# Patient Record
Sex: Female | Born: 1950
Health system: Southern US, Community
[De-identification: ages and names within clinical notes are randomized; demographics above are authoritative.]

## PROBLEM LIST (undated history)

## (undated) DIAGNOSIS — J910 Malignant pleural effusion: Secondary | ICD-10-CM

## (undated) DIAGNOSIS — I7300 Raynaud's syndrome without gangrene: Secondary | ICD-10-CM

## (undated) DIAGNOSIS — K219 Gastro-esophageal reflux disease without esophagitis: Secondary | ICD-10-CM

## (undated) DIAGNOSIS — H409 Unspecified glaucoma: Secondary | ICD-10-CM

## (undated) DIAGNOSIS — R0600 Dyspnea, unspecified: Secondary | ICD-10-CM

## (undated) DIAGNOSIS — I2510 Atherosclerotic heart disease of native coronary artery without angina pectoris: Secondary | ICD-10-CM

## (undated) DIAGNOSIS — C3490 Malignant neoplasm of unspecified part of unspecified bronchus or lung: Secondary | ICD-10-CM

## (undated) DIAGNOSIS — Z9889 Other specified postprocedural states: Secondary | ICD-10-CM

## (undated) DIAGNOSIS — J45909 Unspecified asthma, uncomplicated: Secondary | ICD-10-CM

## (undated) DIAGNOSIS — C801 Malignant (primary) neoplasm, unspecified: Secondary | ICD-10-CM

## (undated) DIAGNOSIS — M1990 Unspecified osteoarthritis, unspecified site: Secondary | ICD-10-CM

## (undated) DIAGNOSIS — I10 Essential (primary) hypertension: Secondary | ICD-10-CM

## (undated) DIAGNOSIS — D649 Anemia, unspecified: Secondary | ICD-10-CM

## (undated) DIAGNOSIS — F419 Anxiety disorder, unspecified: Secondary | ICD-10-CM

## (undated) DIAGNOSIS — F32A Depression, unspecified: Secondary | ICD-10-CM

## (undated) DIAGNOSIS — R7303 Prediabetes: Secondary | ICD-10-CM

## (undated) DIAGNOSIS — F329 Major depressive disorder, single episode, unspecified: Secondary | ICD-10-CM

## (undated) DIAGNOSIS — R112 Nausea with vomiting, unspecified: Secondary | ICD-10-CM

## (undated) HISTORY — PX: TUBAL LIGATION: SHX77

## (undated) HISTORY — PX: COLONOSCOPY: SHX174

## (undated) HISTORY — PX: WISDOM TOOTH EXTRACTION: SHX21

## (undated) HISTORY — DX: Essential (primary) hypertension: I10

## (undated) HISTORY — DX: Raynaud's syndrome without gangrene: I73.00

## (undated) HISTORY — DX: Unspecified osteoarthritis, unspecified site: M19.90

## (undated) HISTORY — PX: ROTATOR CUFF REPAIR: SHX139

## (undated) HISTORY — PX: EYE SURGERY: SHX253

## (undated) HISTORY — PX: ABDOMINAL HYSTERECTOMY: SHX81

## (undated) HISTORY — DX: Unspecified glaucoma: H40.9

## (undated) HISTORY — DX: Malignant neoplasm of unspecified part of unspecified bronchus or lung: C34.90

## (undated) HISTORY — PX: DILATION AND CURETTAGE OF UTERUS: SHX78

## (undated) MED FILL — Sodium Chloride IV Soln 0.9%: INTRAVENOUS | Qty: 250 | Status: AC

## (undated) MED FILL — Diphenhydramine HCl Cap 25 MG: ORAL | Qty: 1 | Status: AC

---

## 2000-10-09 ENCOUNTER — Other Ambulatory Visit: Admission: RE | Admit: 2000-10-09 | Discharge: 2000-10-09 | Payer: Self-pay | Admitting: Gynecology

## 2002-06-17 ENCOUNTER — Ambulatory Visit (HOSPITAL_COMMUNITY): Admission: RE | Admit: 2002-06-17 | Discharge: 2002-06-17 | Payer: Self-pay | Admitting: Gastroenterology

## 2004-04-14 ENCOUNTER — Other Ambulatory Visit: Admission: RE | Admit: 2004-04-14 | Discharge: 2004-04-14 | Payer: Self-pay | Admitting: Gynecology

## 2005-07-20 ENCOUNTER — Encounter: Admission: RE | Admit: 2005-07-20 | Discharge: 2005-07-20 | Payer: Self-pay | Admitting: Emergency Medicine

## 2009-09-07 ENCOUNTER — Encounter: Admission: RE | Admit: 2009-09-07 | Discharge: 2009-09-07 | Payer: Self-pay | Admitting: Internal Medicine

## 2010-04-22 ENCOUNTER — Encounter: Admission: RE | Admit: 2010-04-22 | Discharge: 2010-04-22 | Payer: Self-pay | Admitting: Gastroenterology

## 2010-11-04 NOTE — Op Note (Signed)
   NAME:  Allison [name], Allison [name]                           ACCOUNT NO.:  [id number]   MEDICAL RECORD NO.:  [id number]                   PATIENT TYPE:  AMB   LOCATION:  ENDO                                 FACILITY:  [facility]   PHYSICIAN:  Anselmo Rod, M.D.               DATE OF BIRTH:  08-16-1950   DATE OF PROCEDURE:  06/17/2002  DATE OF DISCHARGE:                                 OPERATIVE REPORT   PROCEDURE PERFORMED:  Screening colonoscopy.   ENDOSCOPIST:  Charna Elizabeth, M.D.   INSTRUMENT USED:  Olympus adjustable pediatric video colonoscope.   INDICATIONS FOR PROCEDURE:  The patient is a 60 year old African-American  female undergoing screening colonoscopy.  The patient has a history of  Raynaud's disease.   PREPROCEDURE PREPARATION:  Informed consent was procured from the patient.  The patient was fasted for eight hours prior to the procedure and prepped  with a bottle of magnesium citrate and MiraLax the night prior to the  procedure.   PREPROCEDURE PHYSICAL:  The patient had stable vital signs.  Neck supple.  Chest clear to auscultation.  S1 and S2 regular.  Abdomen soft with normal  bowel sounds.   DESCRIPTION OF PROCEDURE:  The patient was placed in left lateral decubitus  position and sedated with 70 mg of Demerol and 8 mg of Versed intravenously.  Once the patient was adequately sedated and maintained on low flow oxygen  and continuous cardiac monitoring, the Olympus video colonoscope was  advanced from the rectum to the cecum and terminal ileum without difficulty.  The entire colonic mucosa appeared healthy with normal vascular pattern.  No  masses, polyps, erosions, ulcerations, diverticula or hemorrhoids were seen.  The terminal ileum was healthy and without lesions as well.   IMPRESSION:  Normal colonoscopy up to the terminal ileum.   RECOMMENDATIONS:  1. Repeat colorectal cancer screening is recommended in the next 10 years     unless the patient develops any abnormal  symptoms in the interim.  2.     A high fiber diet has been discussed with the patient and brochures have     been given to her for her education.  Liberal fluid intake along with a     fiber supplements have been advised.  3. Outpatient follow-up on a p.r.n. basis.                                                    Anselmo Rod, M.D.    JNM/MEDQ  D:  06/17/2002  T:  06/17/2002  Job:  [id number]   cc:   Tracey Harries, M.D.  [address]  [place]  [place] [address]  Fax: [phone]

## 2011-07-19 ENCOUNTER — Other Ambulatory Visit: Payer: Self-pay | Admitting: Gynecology

## 2011-07-19 DIAGNOSIS — R928 Other abnormal and inconclusive findings on diagnostic imaging of breast: Secondary | ICD-10-CM

## 2011-07-27 ENCOUNTER — Other Ambulatory Visit: Payer: Self-pay

## 2011-07-28 ENCOUNTER — Ambulatory Visit
Admission: RE | Admit: 2011-07-28 | Discharge: 2011-07-28 | Disposition: A | Discharge status: Home / Self Care | Payer: 59 | Source: Ambulatory Visit | Attending: Gynecology | Admitting: Gynecology

## 2011-07-28 DIAGNOSIS — R928 Other abnormal and inconclusive findings on diagnostic imaging of breast: Secondary | ICD-10-CM

## 2013-02-15 ENCOUNTER — Ambulatory Visit (INDEPENDENT_AMBULATORY_CARE_PROVIDER_SITE_OTHER): Payer: 59 | Admitting: Family Medicine

## 2013-02-15 ENCOUNTER — Encounter: Payer: Self-pay | Admitting: Family Medicine

## 2013-02-15 ENCOUNTER — Ambulatory Visit: Payer: 59

## 2013-02-15 VITALS — BP 170/78 | HR 68 | Temp 99.0°F | Resp 16 | Ht 64.0 in | Wt 148.0 lb

## 2013-02-15 DIAGNOSIS — M546 Pain in thoracic spine: Secondary | ICD-10-CM

## 2013-02-15 DIAGNOSIS — S233XXA Sprain of ligaments of thoracic spine, initial encounter: Secondary | ICD-10-CM

## 2013-02-15 DIAGNOSIS — M549 Dorsalgia, unspecified: Secondary | ICD-10-CM

## 2013-02-15 DIAGNOSIS — S239XXA Sprain of unspecified parts of thorax, initial encounter: Secondary | ICD-10-CM

## 2013-02-15 MED ORDER — CYCLOBENZAPRINE HCL 5 MG PO TABS: 5.0000 mg | ORAL_TABLET | Freq: Three times a day (TID) | ORAL | Status: DC | PRN

## 2013-02-15 MED ORDER — PREDNISONE 20 MG PO TABS: ORAL_TABLET | ORAL | Status: DC

## 2013-02-15 NOTE — Patient Instructions (Addendum)
1.  Take Tramadol for pain as needed. 2.  Apply ice to area for the first 48 hours and then switch to heat. 3.  Avoid lifting > 10 pounds for the next two weeks.

## 2013-02-15 NOTE — Progress Notes (Signed)
[address]   [place], [place]  [address]   [phone]  Subjective:    Patient ID: Allison [name], female    DOB: 28-Jun-1950, 62 y.o.   MRN: [id number]  HPI This 62 y.o. female presents for evaluation of back pain.  Onset today.  Suspect that when gave granddaughter a bath which is an unusual activity.  No pain at that time.  Went to [employer] with acute onset of R upper back pain.  +pain with deep breathing.  Pain with moving buggy at [facility].  Sitting at rest, moving neck makes pain worse.  Putting purse.  R arm feels weak.  Radiates into anterior chest.  Severity 7/10.  No medications for pain since onset.    PCP:  [name]   Review of Systems  Constitutional: Negative for chills, diaphoresis and fatigue.  Musculoskeletal: Positive for myalgias and back pain. Negative for joint swelling, arthralgias and gait problem.  Skin: Negative for rash.  Neurological: Positive for weakness and numbness.    No past medical history on file.  No past surgical history on file.  Prior to Admission medications   Medication Sig Start Date End Date Taking? Authorizing Provider  amitriptyline (ELAVIL) 10 MG tablet Take 10 mg by mouth at bedtime.   Yes Historical Provider, MD  amLODipine (NORVASC) 10 MG tablet Take 10 mg by mouth daily.   Yes Historical Provider, MD  aspirin 81 MG tablet Take 325 mg by mouth daily.   Yes Historical Provider, MD  Pyridoxine HCl (VITAMIN B6 PO) Take by mouth.   Yes Historical Provider, MD    Not on File  History   Social History  . Marital Status: Married    Spouse Name: N/A    Number of Children: N/A  . Years of Education: N/A   Occupational History  . Not on file.   Social History Main Topics  . Smoking status: Not on file  . Smokeless tobacco: Not on file  . Alcohol Use: Not on file  . Drug Use: Not on file  . Sexual Activity: Not on file   Other Topics Concern  . Not on file   Social History Narrative  . No narrative on file    No  family history on file.     Objective:   Physical Exam  Nursing note and vitals reviewed. Constitutional: She is oriented to person, place, and time. She appears well-developed and well-nourished. No distress.  HENT:  Head: Normocephalic and atraumatic.  Eyes: Conjunctivae are normal. Pupils are equal, round, and reactive to light.  Cardiovascular: Normal rate, regular rhythm and normal heart sounds.  Exam reveals no gallop and no friction rub.   No murmur heard. Pulmonary/Chest: Effort normal and breath sounds normal. She has no wheezes. She has no rales.  Musculoskeletal:       Right shoulder: Normal.       Left shoulder: Normal.       Cervical back: She exhibits decreased range of motion, tenderness and pain. She exhibits no bony tenderness, no swelling and no spasm.       Thoracic back: She exhibits tenderness, pain and spasm. She exhibits normal range of motion and no bony tenderness.  Cervical spine: +pain with ROM in all directions; +TTP distal cervical paraspinal region.  Motor 5/5 BUE; sensation intact. Thoracic spine:  +midline TTP; +TTP paraspinal region on R.    Neurological: She is alert and oriented to person, place, and time.  Skin: Skin is warm  and dry. No rash noted. She is not diaphoretic.  Psychiatric: She has a normal mood and affect. Her behavior is normal.      [facility] reading (PRIMARY) by  Dr. Katrinka Blazing. Cervical spine: multilevel spurring and degenerative changes.  Thoracic Spine: NAD   Assessment & Plan:  Upper back pain on right side - Plan: DG Thoracic Spine 2 View, DG Cervical Spine Complete, predniSONE (DELTASONE) 20 MG tablet, cyclobenzaprine (FLEXERIL) 5 MG tablet  Thoracic sprain and strain, initial encounter   1.  Cervical and thoracic pain R:  New.  Recommend Tramadol as needed for pain; pt has rx at home. 2.  Thoracic and cervical strain:  New. With radiculopathy. Rx for Prednisone provided and Flexeril; ice to area for 48 hours and then advised to  switch to heat.  Passive ROM throughout the day; avoid lifting > 10 pounds for two weeks.  If no improvement in 2-3 weeks, call for ortho referral.  Meds ordered this encounter  Medications  . amLODipine (NORVASC) 10 MG tablet    Sig: Take 10 mg by mouth daily.  Marland Kitchen aspirin 81 MG tablet    Sig: Take 325 mg by mouth daily.  . Pyridoxine HCl (VITAMIN B6 PO)    Sig: Take by mouth.  Marland Kitchen amitriptyline (ELAVIL) 10 MG tablet    Sig: Take 10 mg by mouth at bedtime.  . predniSONE (DELTASONE) 20 MG tablet    Sig: Two tablets daily x 5 days then one tablet daily x 5 days    Dispense:  15 tablet    Refill:  0  . cyclobenzaprine (FLEXERIL) 5 MG tablet    Sig: Take 1 tablet (5 mg total) by mouth 3 (three) times daily as needed for muscle spasms.    Dispense:  30 tablet    Refill:  1

## 2013-05-19 ENCOUNTER — Encounter: Payer: Self-pay | Admitting: *Deleted

## 2013-05-19 NOTE — Telephone Encounter (Signed)
This encounter was created in error - please disregard.

## 2014-01-07 ENCOUNTER — Other Ambulatory Visit: Payer: Self-pay | Admitting: Physical Medicine and Rehabilitation

## 2014-01-07 DIAGNOSIS — M25511 Pain in right shoulder: Secondary | ICD-10-CM

## 2014-01-19 ENCOUNTER — Ambulatory Visit
Admission: RE | Admit: 2014-01-19 | Discharge: 2014-01-19 | Disposition: A | Discharge status: Home / Self Care | Payer: 59 | Source: Ambulatory Visit | Attending: Physical Medicine and Rehabilitation | Admitting: Physical Medicine and Rehabilitation

## 2014-01-19 DIAGNOSIS — M25511 Pain in right shoulder: Secondary | ICD-10-CM

## 2014-04-20 DIAGNOSIS — G4700 Insomnia, unspecified: Secondary | ICD-10-CM | POA: Insufficient documentation

## 2014-10-07 ENCOUNTER — Ambulatory Visit (INDEPENDENT_AMBULATORY_CARE_PROVIDER_SITE_OTHER): Payer: 59 | Admitting: Physician Assistant

## 2014-10-07 VITALS — BP 132/78 | HR 74 | Temp 98.2°F | Resp 18 | Ht 63.08 in | Wt 155.8 lb

## 2014-10-07 DIAGNOSIS — R229 Localized swelling, mass and lump, unspecified: Secondary | ICD-10-CM | POA: Diagnosis not present

## 2014-10-07 DIAGNOSIS — R590 Localized enlarged lymph nodes: Secondary | ICD-10-CM

## 2014-10-07 LAB — POCT CBC
Granulocyte percent: 38.7 %G (ref 37–80)
HCT, POC: 40.1 % (ref 37.7–47.9)
Hemoglobin: 12.6 g/dL (ref 12.2–16.2)
Lymph, poc: 3.6 — AB (ref 0.6–3.4)
MCH, POC: 26.1 pg — AB (ref 27–31.2)
MCHC: 31.4 g/dL — AB (ref 31.8–35.4)
MCV: 83.2 fL (ref 80–97)
MID (cbc): 0.3 (ref 0–0.9)
MPV: 7.7 fL (ref 0–99.8)
POC Granulocyte: 2.4 (ref 2–6.9)
POC LYMPH PERCENT: 56.9 %L — AB (ref 10–50)
POC MID %: 4.4 %M (ref 0–12)
Platelet Count, POC: 324 10*3/uL (ref 142–424)
RBC: 4.82 M/uL (ref 4.04–5.48)
RDW, POC: 16.4 %
WBC: 6.3 10*3/uL (ref 4.6–10.2)

## 2014-10-07 MED ORDER — TRIAMCINOLONE ACETONIDE 0.1 % EX CREA: 1.0000 "application " | TOPICAL_CREAM | Freq: Two times a day (BID) | CUTANEOUS | Status: DC

## 2014-10-07 NOTE — Patient Instructions (Signed)
Please apply the medication as prescribed.  Place it for one week, if symptoms is improving, continue for 1 more week.  If the bumps are not getting any better within a week, please contact me.

## 2014-10-07 NOTE — Progress Notes (Signed)
[facility] [address] [place] [address] [phone]  Date:  10/07/2014   Name:  Allison [name]   DOB:  05-18-1951   MRN:  [id number]  PCP:  Salena Saner., MD    Chief Complaint: Lumps on Back of Neck   History of Present Illness:  Allison [name] is a 64 y.o. very pleasant female patient who presents with the following:  Patient is here today complaining of bumps on her neck that she noticed this morning.  She woke up and scratched her neck and felt the two bumps.  She then noticed that her right eye had a bump just beneath it.  She has had no pain at these locations, it is mildly pruritic.  She denies any knowledge of having contact with any offending agent, though she was at the beach over the last 3 days.  She has no fever, mucosa swelling, or pain.  She has no malaise or body ache.  She has attempted alcohol at the neck, which burned secondary to her scratching.  No other family members or travelling companions have reported an issue.  She has no pets, or contact with animals recently.   There are no active problems to display for this patient.   Past Medical History  Diagnosis Date  . Arthritis   . Glaucoma   . Hypertension   . Raynaud's disease     Past Surgical History  Procedure Laterality Date  . Abdominal hysterectomy    . Rotator cuff repair      History  Substance Use Topics  . Smoking status: Never Smoker   . Smokeless tobacco: Never Used  . Alcohol Use: Yes    Family History  Problem Relation Age of Onset  . Heart disease Sister   . Heart disease Brother     Allergies  Allergen Reactions  . Penicillins   . Vicodin [Hydrocodone-Acetaminophen]     Medication list has been reviewed and updated.  Current Outpatient Prescriptions on File Prior to Visit  Medication Sig Dispense Refill  . amitriptyline (ELAVIL) 10 MG tablet Take 10 mg by mouth at bedtime.    Marland Kitchen amLODipine (NORVASC) 10 MG tablet Take 10 mg by mouth daily.     Marland Kitchen aspirin 81 MG tablet Take 325 mg by mouth daily.    . cyclobenzaprine (FLEXERIL) 5 MG tablet Take 1 tablet (5 mg total) by mouth 3 (three) times daily as needed for muscle spasms. (Patient not taking: Reported on 10/07/2014) 30 tablet 1  . predniSONE (DELTASONE) 20 MG tablet Two tablets daily x 5 days then one tablet daily x 5 days (Patient not taking: Reported on 10/07/2014) 15 tablet 0  . Pyridoxine HCl (VITAMIN B6 PO) Take by mouth.     No current facility-administered medications on file prior to visit.    Review of Systems: ROS otherwise unremarkable unless listed above.  Physical Examination: Filed Vitals:   10/07/14 1510  BP: 132/78  Pulse: 74  Temp: 98.2 F (36.8 C)  Resp: 18   Filed Vitals:   10/07/14 1510  Height: 5' 3.08" (1.602 m)  Weight: 155 lb 12.8 oz (70.67 kg)   Body mass index is 27.54 kg/(m^2). Ideal Body Weight: Weight in (lb) to have BMI = 25: 141.2 Physical Exam  Constitutional: She is oriented to person, place, and time. She appears well-developed and well-nourished.  HENT:  Head: Normocephalic and atraumatic.  Neck: No thyromegaly present.  Cardiovascular: Normal rate and regular rhythm.  Pulmonary/Chest: Effort normal and breath sounds normal.  Abdominal: Soft. Bowel sounds are normal. She exhibits no mass. There is no tenderness.  Lymphadenopathy:       Head (right side): No submandibular, no tonsillar, no preauricular and no posterior auricular adenopathy present.       Head (left side): No submandibular, no tonsillar, no preauricular and no posterior auricular adenopathy present.       Right: No supraclavicular adenopathy present.       Left: No supraclavicular adenopathy present.  Neurological: She is alert and oriented to person, place, and time.  Skin: Skin is warm and dry.  2 posterior nodules at the c4-c5.  They are erythematous and superficial at skin.  They are non-tender.  Difficult to find any puncture site.  The right nodule is  slightly more superior to the left nodule which are parallel, bordering the vertebrae.  The right side of face also has superficial erythematous nodule just beneath the left outer canthus.  Psychiatric: She has a normal mood and affect. Her behavior is normal.    Results for orders placed or performed in visit on 10/07/14  POCT CBC  Result Value Ref Range   WBC 6.3 4.6 - 10.2 K/uL   Lymph, poc 3.6 (A) 0.6 - 3.4   POC LYMPH PERCENT 56.9 (A) 10 - 50 %L   MID (cbc) 0.3 0 - 0.9   POC MID % 4.4 0 - 12 %M   POC Granulocyte 2.4 2 - 6.9   Granulocyte percent 38.7 37 - 80 %G   RBC 4.82 4.04 - 5.48 M/uL   Hemoglobin 12.6 12.2 - 16.2 g/dL   HCT, POC 40.1 37.7 - 47.9 %   MCV 83.2 80 - 97 fL   MCH, POC 26.1 (A) 27 - 31.2 pg   MCHC 31.4 (A) 31.8 - 35.4 g/dL   RDW, POC 16.4 %   Platelet Count, POC 324 142 - 424 K/uL   MPV 7.7 0 - 99.8 fL     Assessment and Plan: 64 year old female is here today for chief complaint of bumps at the back of her neck.  At this time, we are more suspicious that these could be insect bites given the superficial appearance.  Treating with mild corticosteroid. rtc if sxs do not resolve in 7 days.  Swelling, superficial, localized - Plan: POCT CBC, triamcinolone cream (KENALOG) 0.1 %  Ivar Drape, PA-C [facility] 4/26/201610:49 AM

## 2014-10-17 NOTE — Progress Notes (Signed)
  Medical screening examination/treatment/procedure(s) were performed by non-physician practitioner and as supervising physician I was immediately available for consultation/collaboration.     

## 2015-01-22 ENCOUNTER — Telehealth: Payer: Self-pay

## 2015-01-22 NOTE — Telephone Encounter (Signed)
Patient was calling about receiving a prescription for Integra Plus capsules.  She went to Dr. Juanita Craver several years ago and was prescribed this medication for her low iron levels.  She said her recent bloodwork indicated that her ferritin levels were at 5.  She wasn't sure if we could prescribe her that medication, or if she needed to go through Dr. Collene Mares at [facility].  Please advise.  Thank you.  CB#: [phone]

## 2015-01-22 NOTE — Telephone Encounter (Signed)
Please advise 

## 2015-01-22 NOTE — Telephone Encounter (Signed)
She has not been seen here for this and should come to clinic or check with her PCP for a refill. Thank you!

## 2015-01-22 NOTE — Telephone Encounter (Signed)
error 

## 2015-03-29 DIAGNOSIS — M79673 Pain in unspecified foot: Secondary | ICD-10-CM | POA: Insufficient documentation

## 2015-04-12 ENCOUNTER — Ambulatory Visit (INDEPENDENT_AMBULATORY_CARE_PROVIDER_SITE_OTHER): Payer: 59

## 2015-04-12 ENCOUNTER — Ambulatory Visit (INDEPENDENT_AMBULATORY_CARE_PROVIDER_SITE_OTHER): Payer: 59 | Admitting: Podiatry

## 2015-04-12 ENCOUNTER — Encounter: Payer: Self-pay | Admitting: Podiatry

## 2015-04-12 VITALS — BP 147/82 | HR 67 | Resp 12

## 2015-04-12 DIAGNOSIS — M79672 Pain in left foot: Secondary | ICD-10-CM

## 2015-04-12 DIAGNOSIS — M7662 Achilles tendinitis, left leg: Secondary | ICD-10-CM

## 2015-04-12 DIAGNOSIS — M79671 Pain in right foot: Secondary | ICD-10-CM

## 2015-04-12 NOTE — Progress Notes (Signed)
   Subjective:    Patient ID: Allison [name], female    DOB: [birth year], 64 y.o.   MRN: [id number]  HPI  PT STATED BACK OF THE HEEL IS BEEN PAINFUL FOR 3 YEARS. FOOT IS GETTING WORSE ESPECIALLY WHEN PUTTING PRESSURE AND TRIED NO TREATMENT.  Review of Systems  Cardiovascular: Positive for leg swelling.  Psychiatric/Behavioral: The patient is nervous/anxious.        Objective:   Physical Exam        Assessment & Plan:

## 2015-04-12 NOTE — Patient Instructions (Addendum)
Achilles Tendinitis  with Rehab Achilles tendinitis is a disorder of the Achilles tendon. The Achilles tendon connects the large calf muscles (Gastrocnemius and Soleus) to the heel bone (calcaneus). This tendon is sometimes called the heel cord. It is important for pushing-off and standing on your toes and is important for walking, running, or jumping. Tendinitis is often caused by overuse and repetitive microtrauma. SYMPTOMS  Pain, tenderness, swelling, warmth, and redness may occur over the Achilles tendon even at rest.  Pain with pushing off, or flexing or extending the ankle.  Pain that is worsened after or during activity. CAUSES   Overuse sometimes seen with rapid increase in exercise programs or in sports requiring running and jumping.  Poor physical conditioning (strength and flexibility or endurance).  Running sports, especially training running down hills.  Inadequate warm-up before practice or play or failure to stretch before participation.  Injury to the tendon. PREVENTION   Warm up and stretch before practice or competition.  Allow time for adequate rest and recovery between practices and competition.  Keep up conditioning.  Keep up ankle and leg flexibility.  Improve or keep muscle strength and endurance.  Improve cardiovascular fitness.  Use proper technique.  Use proper equipment (shoes, skates).  To help prevent recurrence, taping, protective strapping, or an adhesive bandage may be recommended for several weeks after healing is complete. PROGNOSIS   Recovery may take weeks to several months to heal.  Longer recovery is expected if symptoms have been prolonged.  Recovery is usually quicker if the inflammation is due to a direct blow as compared with overuse or sudden strain. RELATED COMPLICATIONS   Healing time will be prolonged if the condition is not correctly treated. The injury must be given plenty of time to heal.  Symptoms can reoccur if  activity is resumed too soon.  Untreated, tendinitis may increase the risk of tendon rupture requiring additional time for recovery and possibly surgery. TREATMENT   The first treatment consists of rest anti-inflammatory medication, and ice to relieve the pain.  Stretching and strengthening exercises after resolution of pain will likely help reduce the risk of recurrence. Referral to a physical therapist or athletic trainer for further evaluation and treatment may be helpful.  A walking boot or cast may be recommended to rest the Achilles tendon. This can help break the cycle of inflammation and microtrauma.  Arch supports (orthotics) may be prescribed or recommended by your caregiver as an adjunct to therapy and rest.  Surgery to remove the inflamed tendon lining or degenerated tendon tissue is rarely necessary and has shown less than predictable results. MEDICATION   Nonsteroidal anti-inflammatory medications, such as aspirin and ibuprofen, may be used for pain and inflammation relief. Do not take within 7 days before surgery. Take these as directed by your caregiver. Contact your caregiver immediately if any bleeding, stomach upset, or signs of allergic reaction occur. Other minor pain relievers, such as acetaminophen, may also be used.  Pain relievers may be prescribed as necessary by your caregiver. Do not take prescription pain medication for longer than 4 to 7 days. Use only as directed and only as much as you need.  Cortisone injections are rarely indicated. Cortisone injections may weaken tendons and predispose to rupture. It is better to give the condition more time to heal than to use them. HEAT AND COLD  Cold is used to relieve pain and reduce inflammation for acute and chronic Achilles tendinitis. Cold should be applied for 10 to 15 minutes   every 2 to 3 hours for inflammation and pain and immediately after any activity that aggravates your symptoms. Use ice packs or an ice  massage.  Heat may be used before performing stretching and strengthening activities prescribed by your caregiver. Use a heat pack or a warm soak. SEEK MEDICAL CARE IF:  Symptoms get worse or do not improve in 2 weeks despite treatment.  New, unexplained symptoms develop. Drugs used in treatment may produce side effects.  EXERCISES:  RANGE OF MOTION (ROM) AND STRETCHING EXERCISES - Achilles Tendinitis  These exercises may help you when beginning to rehabilitate your injury. Your symptoms may resolve with or without further involvement from your physician, physical therapist or athletic trainer. While completing these exercises, remember:   Restoring tissue flexibility helps normal motion to return to the joints. This allows healthier, less painful movement and activity.  An effective stretch should be held for at least 30 seconds.  A stretch should never be painful. You should only feel a gentle lengthening or release in the stretched tissue.  STRETCH  Gastroc, Standing   Place hands on wall.  Extend right / left leg, keeping the front knee somewhat bent.  Slightly point your toes inward on your back foot.  Keeping your right / left heel on the floor and your knee straight, shift your weight toward the wall, not allowing your back to arch.  You should feel a gentle stretch in the right / left calf. Hold this position for 10 seconds. Repeat 3 times. Complete this stretch 2 times per day.  STRETCH  Soleus, Standing   Place hands on wall.  Extend right / left leg, keeping the other knee somewhat bent.  Slightly point your toes inward on your back foot.  Keep your right / left heel on the floor, bend your back knee, and slightly shift your weight over the back leg so that you feel a gentle stretch deep in your back calf.  Hold this position for 10 seconds. Repeat 3 times. Complete this stretch 2 times per day.  STRETCH  Gastrocsoleus, Standing  Note: This exercise can place  a lot of stress on your foot and ankle. Please complete this exercise only if specifically instructed by your caregiver.   Place the ball of your right / left foot on a step, keeping your other foot firmly on the same step.  Hold on to the wall or a rail for balance.  Slowly lift your other foot, allowing your body weight to press your heel down over the edge of the step.  You should feel a stretch in your right / left calf.  Hold this position for 10 seconds.  Repeat this exercise with a slight bend in your knee. Repeat 3 times. Complete this stretch 2 times per day.   STRENGTHENING EXERCISES - Achilles Tendinitis These exercises may help you when beginning to rehabilitate your injury. They may resolve your symptoms with or without further involvement from your physician, physical therapist or athletic trainer. While completing these exercises, remember:   Muscles can gain both the endurance and the strength needed for everyday activities through controlled exercises.  Complete these exercises as instructed by your physician, physical therapist or athletic trainer. Progress the resistance and repetitions only as guided.  You may experience muscle soreness or fatigue, but the pain or discomfort you are trying to eliminate should never worsen during these exercises. If this pain does worsen, stop and make certain you are following the directions exactly. If   the pain is still present after adjustments, discontinue the exercise until you can discuss the trouble with your clinician.  STRENGTH - Plantar-flexors   Sit with your right / left leg extended. Holding onto both ends of a rubber exercise band/tubing, loop it around the ball of your foot. Keep a slight tension in the band.  Slowly push your toes away from you, pointing them downward.  Hold this position for 10 seconds. Return slowly, controlling the tension in the band/tubing. Repeat 3 times. Complete this exercise 2 times per day.    STRENGTH - Plantar-flexors   Stand with your feet shoulder width apart. Steady yourself with a wall or table using as little support as needed.  Keeping your weight evenly spread over the width of your feet, rise up on your toes.*  Hold this position for 10 seconds. Repeat 3 times. Complete this exercise 2 times per day.  *If this is too easy, shift your weight toward your right / left leg until you feel challenged. Ultimately, you may be asked to do this exercise with your right / left foot only.  STRENGTH  Plantar-flexors, Eccentric  Note: This exercise can place a lot of stress on your foot and ankle. Please complete this exercise only if specifically instructed by your caregiver.   Place the balls of your feet on a step. With your hands, use only enough support from a wall or rail to keep your balance.  Keep your knees straight and rise up on your toes.  Slowly shift your weight entirely to your right / left toes and pick up your opposite foot. Gently and with controlled movement, lower your weight through your right / left foot so that your heel drops below the level of the step. You will feel a slight stretch in the back of your calf at the end position.  Use the healthy leg to help rise up onto the balls of both feet, then lower weight only on the right / left leg again. Build up to 15 repetitions. Then progress to 3 consecutive sets of 15 repetitions.*  After completing the above exercise, complete the same exercise with a slight knee bend (about 30 degrees). Again, build up to 15 repetitions. Then progress to 3 consecutive sets of 15 repetitions.* Perform this exercise 2 times per day.  *When you easily complete 3 sets of 15, your physician, physical therapist or athletic trainer may advise you to add resistance by wearing a backpack filled with additional weight.  STRENGTH - Plantar Flexors, Seated   Sit on a chair that allows your feet to rest flat on the ground. If  necessary, sit at the edge of the chair.  Keeping your toes firmly on the ground, lift your right / left heel as far as you can without increasing any discomfort in your ankle. Repeat 3 times. Complete this exercise 2 times a day.   

## 2015-04-13 NOTE — Progress Notes (Signed)
Subjective:     Patient ID: Carle Dargan, female   DOB: 1951/02/03, 64 y.o.   MRN: [id number]  HPI patient presents with mild to moderate pain in the posterior aspect of the left heel at the insertional point of the Achilles into the heel. States it bothers her quite a bit at times and then at other times it seems pretty good   Review of Systems     Objective:   Physical Exam Neurovascular status intact muscle strength adequate with mild discomfort at the posterior heel left insertional point Achilles into the calcaneus and slightly proximal with normal muscle strength of the Achilles. Patient has good digital perfusion and is well oriented 3    Assessment:     Mild Achilles tendinitis left    Plan:     Evaluated condition and reviewed H&P and x-rays with patient. Today I went ahead and advised on physical therapy shoe gear modifications ice therapy and will reappoint if symptoms persist

## 2015-04-25 ENCOUNTER — Ambulatory Visit (INDEPENDENT_AMBULATORY_CARE_PROVIDER_SITE_OTHER): Payer: 59 | Admitting: Family Medicine

## 2015-04-25 VITALS — BP 128/80 | HR 86 | Temp 98.0°F | Resp 17 | Ht 64.0 in | Wt 155.0 lb

## 2015-04-25 DIAGNOSIS — L732 Hidradenitis suppurativa: Secondary | ICD-10-CM

## 2015-04-25 MED ORDER — HYDROCODONE-ACETAMINOPHEN 5-325 MG PO TABS: 1.0000 | ORAL_TABLET | Freq: Four times a day (QID) | ORAL | Status: DC | PRN

## 2015-04-25 MED ORDER — GABAPENTIN 100 MG PO CAPS: 100.0000 mg | ORAL_CAPSULE | Freq: Every day | ORAL | Status: DC

## 2015-04-25 MED ORDER — CLINDAMYCIN HCL 150 MG PO CAPS: 150.0000 mg | ORAL_CAPSULE | Freq: Three times a day (TID) | ORAL | Status: DC

## 2015-04-25 NOTE — Addendum Note (Signed)
Addended by: Robyn Haber on: 04/25/2015 10:01 AM   Modules accepted: Orders

## 2015-04-25 NOTE — Progress Notes (Addendum)
$'@UMFCLOGO'N$ @  This chart was scribed for Robyn Haber, MD by Thea Alken, ED Scribe. This patient was seen in room 9 and the patient's care was started at 9:40 AM.  Patient ID: Allison [name] MRN: [id number], DOB: [birth year], 64 y.o. Date of Encounter: 04/25/2015, 10:07 AM  Primary Physician: Salena Saner., MD  Chief Complaint:  Chief Complaint  Patient presents with   Rash    armpit     HPI: 64 y.o. year old female with history below presents with  painful folliculitis along left axilla for 2 weeks. Pt has had recurring folliculitis only to left axilla for the past several months. She was referred to dermatology to 2 months ago but has not received a phone call. She has been on doxycyline, cephalexin and prednisone. She has pain with left arm movement and is starting to have left back pain. Home treatments consist of warm soaks, chlorox baths, tea tree oil, and OTC creams.  She's had groin irritation in the past as well but she's been able to manage this by simply scrubbing the area vigorously with over-the-counter products  Pt is retired from [employer].  Past Medical History  Diagnosis Date   Arthritis    Glaucoma    Hypertension    Raynaud's disease      Home Meds: Prior to Admission medications   Medication Sig Start Date End Date Taking? Authorizing Provider  amitriptyline (ELAVIL) 10 MG tablet Take 10 mg by mouth at bedtime.   Yes Historical Provider, MD  amLODipine (NORVASC) 10 MG tablet Take 10 mg by mouth daily.   Yes Historical Provider, MD  aspirin 81 MG tablet Take 325 mg by mouth daily.   Yes Historical Provider, MD  brimonidine (ALPHAGAN P) 0.1 % SOLN    Yes Historical Provider, MD  meloxicam (MOBIC) 15 MG tablet TAKE 1 TABLET(S) EVERY DAY BY ORAL ROUTE AS NEEDED. 02/02/15  Yes Historical Provider, MD  TRIAMTERENE PO Take by mouth.   Yes Historical Provider, MD  triamterene-hydrochlorothiazide (MAXZIDE-25) 37.5-25 MG tablet Take 1 tablet by mouth daily. 02/05/15   Yes Historical Provider, MD  cyclobenzaprine (FLEXERIL) 5 MG tablet Take 1 tablet (5 mg total) by mouth 3 (three) times daily as needed for muscle spasms. Patient not taking: Reported on 04/25/2015 02/15/13   Wardell Honour, MD  Pyridoxine HCl (VITAMIN B6 PO) Take by mouth.    Historical Provider, MD  triamcinolone cream (KENALOG) 0.1 % Apply 1 application topically 2 (two) times daily. Patient not taking: Reported on 04/25/2015 10/07/14   Dorian Heckle English, PA    Allergies:  Allergies  Allergen Reactions   Penicillins    Vicodin [Hydrocodone-Acetaminophen]     Social History   Social History   Marital Status: Married    Spouse Name: N/A   Number of Children: 3   Years of Education: N/A   Occupational History    [employer]   Social History Main Topics   Smoking status: Never Smoker    Smokeless tobacco: Never Used   Alcohol Use: Yes   Drug Use: No   Sexual Activity: Not on file   Other Topics Concern   Not on file   Social History Narrative    Review of Systems: Constitutional: negative for chills, fever, night sweats, weight changes, or fatigue  HEENT: negative for vision changes, hearing loss, congestion, rhinorrhea, ST, epistaxis, or sinus pressure Cardiovascular: negative for chest pain or palpitations Respiratory: negative for hemoptysis, wheezing, shortness of breath, or cough Abdominal: negative  for abdominal pain, nausea, vomiting, diarrhea, or constipation Dermatological: negative for rash Neurologic: negative for headache, dizziness, or syncope All other systems reviewed and are otherwise negative with the exception to those above and in the HPI.   Physical Exam: Blood pressure 128/80, pulse 86, temperature 98 F (36.7 C), temperature source Oral, resp. rate 17, height '5\' 4"'$  (1.626 m), weight 155 lb (70.308 kg)., Body mass index is 26.59 kg/(m^2). General: Well developed, well nourished, in no acute distress. Head: Normocephalic, atraumatic,  eyes without discharge, sclera non-icteric, nares are without discharge. Bilateral auditory canals clear, TM's are without perforation, pearly grey and translucent with reflective cone of light bilaterally. Oral cavity moist, posterior pharynx without exudate, erythema, peritonsillar abscess, or post nasal drip.  Neck: Supple. No thyromegaly. Full ROM. No lymphadenopathy. Lungs: Clear bilaterally to auscultation without wheezes, rales, or rhonchi. Breathing is unlabored. Heart: RRR with S1 S2. No murmurs, rubs, or gallops appreciated. Abdomen: Soft, non-tender, non-distended with normoactive bowel sounds. No hepatomegaly. No rebound/guarding. No obvious abdominal masses. Msk:  Strength and tone normal for age. Extremities/Skin: 2x1 cm boils in the left axilla ( patient refuses I&D) with surrounding erythema and swelling Neuro: Alert and oriented X 3. Moves all extremities spontaneously. Gait is normal. CNII-XII grossly in tact. Psych:  Responds to questions appropriately with a normal affect.   ASSESSMENT AND PLAN:  64 y.o. year old female with hidradenitis  This chart was scribed in my presence and reviewed by me personally.    ICD-9-CM ICD-10-CM   1. Hidradenitis axillaris 705.83 L73.2 clindamycin (CLEOCIN) 150 MG capsule     Ambulatory referral to Dermatology     gabapentin (NEURONTIN) 100 MG capsule     DISCONTINUED: HYDROcodone-acetaminophen (NORCO) 5-325 MG tablet     Signed, Robyn Haber, MD    By signing my name below, I, Raven Small, attest that this documentation has been prepared under the direction and in the presence of Robyn Haber, MD.  Electronically Signed: Thea Alken, ED Scribe. 04/25/2015. 10:07 AM.  Signed, Robyn Haber, MD 04/25/2015 10:07 AM

## 2015-04-25 NOTE — Patient Instructions (Signed)
Hidradenitis Suppurativa  Hidradenitis suppurativa is a long-term (chronic) skin disease that starts with blocked sweat glands or hair follicles. Bacteria may grow in these blocked openings of your skin. Hidradenitis suppurativa is like a severe form of acne that develops in areas of your body where acne would be unusual. It is most likely to affect the areas of your body where skin rubs against skin and becomes moist. This includes your:  · Underarms.  · Groin.  · Genital areas.  · Buttocks.  · Upper thighs.  · Breasts.  Hidradenitis suppurativa may start out with small pimples. The pimples can develop into deep sores that break open (rupture) and drain pus. Over time your skin may thicken and become scarred. Hidradenitis suppurativa cannot be passed from person to person.   CAUSES   The exact cause of hidradenitis suppurativa is not known. This condition may be due to:  · Female and female hormones. The condition is rare before and after puberty.  · An overactive body defense system (immune system). Your immune system may overreact to the blocked hair follicles or sweat glands and cause swelling and pus-filled sores.  RISK FACTORS  You may have a higher risk of hidradenitis suppurativa if you:  · Are a woman.  · Are between ages 11 and 55.  · Have a family history of hidradenitis suppurativa.  · Have a personal history of acne.  · Are overweight.  · Smoke.  · Take the drug lithium.  SIGNS AND SYMPTOMS   The first signs of an outbreak are usually painful skin bumps that look like pimples. As the condition progresses:  · Skin bumps may get bigger and grow deeper into the skin.  · Bumps under the skin may rupture and drain smelly pus.  · Skin may become itchy and infected.  · Skin may thicken and scar.  · Drainage may continue through tunnels under the skin (fistulas).  · Walking and moving your arms can become painful.  DIAGNOSIS   Your health care provider may diagnose hidradenitis suppurativa based on your medical  history and your signs and symptoms. A physical exam will also be done. You may need to see a health care provider who specializes in skin diseases (dermatologist). You may also have tests done to confirm the diagnosis. These can include:  · Swabbing a sample of pus or drainage from your skin so it can be sent to the lab and tested for infection.  · Blood tests to check for infection.  TREATMENT   The same treatment will not work for everybody with hidradenitis suppurativa. Your treatment will depend on how severe your symptoms are. You may need to try several treatments to find what works best for you. Part of your treatment may include cleaning and bandaging (dressing) your wounds. You may also have to take medicines, such as the following:  · Antibiotics.  · Acne medicines.  · Medicines to block or suppress the immune system.  · A diabetes medicine (metformin) is sometimes used to treat this condition.  · For women, birth control pills can sometimes help relieve symptoms.  You may need surgery if you have a severe case of hidradenitis suppurativa that does not respond to medicine. Surgery may involve:   · Using a laser to clear the skin and remove hair follicles.  · Opening and draining deep sores.  · Removing the areas of skin that are diseased and scarred.  HOME CARE INSTRUCTIONS  · Learn as much as   you can about your disease, and work closely with your health care providers.  · Take medicines only as directed by your health care provider.  · If you were prescribed an antibiotic medicine, finish it all even if you start to feel better.  · If you are overweight, losing weight may be very helpful. Try to reach and maintain a healthy weight.  · Do not use any tobacco products, including cigarettes, chewing tobacco, or electronic cigarettes. If you need help quitting, ask your health care provider.  · Do not shave the areas where you get hidradenitis suppurativa.  · Do not wear deodorant.  · Wear loose-fitting  clothes.  · Try not to overheat and get sweaty.  · Take a daily bleach bath as directed by your health care provider.  ¨ Fill your bathtub halfway with water.  ¨ Pour in ½ cup of unscented household bleach.  ¨ Soak for 5-10 minutes.  · Cover sore areas with a warm, clean washcloth (compress) for 5-10 minutes.  SEEK MEDICAL CARE IF:   · You have a flare-up of hidradenitis suppurativa.  · You have chills or a fever.  · You are having trouble controlling your symptoms at home.     This information is not intended to replace advice given to you by your health care provider. Make sure you discuss any questions you have with your health care provider.     Document Released: 01/18/2004 Document Revised: 06/26/2014 Document Reviewed: 09/05/2013  Elsevier Interactive Patient Education ©2016 [employer]

## 2015-04-25 NOTE — Addendum Note (Signed)
Addended by: Robyn Haber on: 04/25/2015 10:08 AM   Modules accepted: Orders

## 2016-01-18 DIAGNOSIS — L304 Erythema intertrigo: Secondary | ICD-10-CM | POA: Diagnosis not present

## 2016-01-18 DIAGNOSIS — L309 Dermatitis, unspecified: Secondary | ICD-10-CM | POA: Diagnosis not present

## 2016-02-23 DIAGNOSIS — H401131 Primary open-angle glaucoma, bilateral, mild stage: Secondary | ICD-10-CM | POA: Diagnosis not present

## 2016-03-07 DIAGNOSIS — E663 Overweight: Secondary | ICD-10-CM | POA: Diagnosis not present

## 2016-03-07 DIAGNOSIS — E785 Hyperlipidemia, unspecified: Secondary | ICD-10-CM | POA: Diagnosis not present

## 2016-03-07 DIAGNOSIS — I10 Essential (primary) hypertension: Secondary | ICD-10-CM | POA: Diagnosis not present

## 2016-03-07 DIAGNOSIS — N959 Unspecified menopausal and perimenopausal disorder: Secondary | ICD-10-CM | POA: Diagnosis not present

## 2016-03-08 DIAGNOSIS — N959 Unspecified menopausal and perimenopausal disorder: Secondary | ICD-10-CM | POA: Diagnosis not present

## 2016-03-08 DIAGNOSIS — I10 Essential (primary) hypertension: Secondary | ICD-10-CM | POA: Diagnosis not present

## 2016-04-25 DIAGNOSIS — H10413 Chronic giant papillary conjunctivitis, bilateral: Secondary | ICD-10-CM | POA: Diagnosis not present

## 2016-04-25 DIAGNOSIS — H401131 Primary open-angle glaucoma, bilateral, mild stage: Secondary | ICD-10-CM | POA: Diagnosis not present

## 2016-04-25 DIAGNOSIS — E785 Hyperlipidemia, unspecified: Secondary | ICD-10-CM | POA: Diagnosis not present

## 2016-04-25 DIAGNOSIS — H2513 Age-related nuclear cataract, bilateral: Secondary | ICD-10-CM | POA: Diagnosis not present

## 2016-04-25 DIAGNOSIS — R7309 Other abnormal glucose: Secondary | ICD-10-CM | POA: Diagnosis not present

## 2016-04-25 DIAGNOSIS — E559 Vitamin D deficiency, unspecified: Secondary | ICD-10-CM | POA: Diagnosis not present

## 2016-04-25 DIAGNOSIS — H04123 Dry eye syndrome of bilateral lacrimal glands: Secondary | ICD-10-CM | POA: Diagnosis not present

## 2016-04-25 DIAGNOSIS — I10 Essential (primary) hypertension: Secondary | ICD-10-CM | POA: Diagnosis not present

## 2016-05-03 ENCOUNTER — Other Ambulatory Visit: Payer: Self-pay | Admitting: Internal Medicine

## 2016-05-03 DIAGNOSIS — E663 Overweight: Secondary | ICD-10-CM | POA: Diagnosis not present

## 2016-05-03 DIAGNOSIS — E2839 Other primary ovarian failure: Secondary | ICD-10-CM

## 2016-05-03 DIAGNOSIS — Z0000 Encounter for general adult medical examination without abnormal findings: Secondary | ICD-10-CM | POA: Diagnosis not present

## 2016-05-03 DIAGNOSIS — R7309 Other abnormal glucose: Secondary | ICD-10-CM | POA: Diagnosis not present

## 2016-05-03 DIAGNOSIS — Z139 Encounter for screening, unspecified: Secondary | ICD-10-CM | POA: Diagnosis not present

## 2016-05-03 DIAGNOSIS — I10 Essential (primary) hypertension: Secondary | ICD-10-CM | POA: Diagnosis not present

## 2016-05-03 DIAGNOSIS — E785 Hyperlipidemia, unspecified: Secondary | ICD-10-CM | POA: Diagnosis not present

## 2016-05-03 DIAGNOSIS — R21 Rash and other nonspecific skin eruption: Secondary | ICD-10-CM | POA: Diagnosis not present

## 2016-05-05 ENCOUNTER — Ambulatory Visit
Admission: RE | Admit: 2016-05-05 | Discharge: 2016-05-05 | Disposition: A | Discharge status: Home / Self Care | Payer: Medicare HMO | Source: Ambulatory Visit | Attending: Internal Medicine | Admitting: Internal Medicine

## 2016-05-05 DIAGNOSIS — M85851 Other specified disorders of bone density and structure, right thigh: Secondary | ICD-10-CM | POA: Diagnosis not present

## 2016-05-05 DIAGNOSIS — Z780 Asymptomatic menopausal state: Secondary | ICD-10-CM | POA: Diagnosis not present

## 2016-05-05 DIAGNOSIS — E2839 Other primary ovarian failure: Secondary | ICD-10-CM

## 2016-07-19 DIAGNOSIS — R69 Illness, unspecified: Secondary | ICD-10-CM | POA: Diagnosis not present

## 2016-08-29 DIAGNOSIS — N959 Unspecified menopausal and perimenopausal disorder: Secondary | ICD-10-CM | POA: Diagnosis not present

## 2016-08-29 DIAGNOSIS — R69 Illness, unspecified: Secondary | ICD-10-CM | POA: Diagnosis not present

## 2016-08-29 DIAGNOSIS — I10 Essential (primary) hypertension: Secondary | ICD-10-CM | POA: Diagnosis not present

## 2016-08-29 DIAGNOSIS — J069 Acute upper respiratory infection, unspecified: Secondary | ICD-10-CM | POA: Diagnosis not present

## 2016-08-29 DIAGNOSIS — E663 Overweight: Secondary | ICD-10-CM | POA: Diagnosis not present

## 2016-08-31 DIAGNOSIS — H2513 Age-related nuclear cataract, bilateral: Secondary | ICD-10-CM | POA: Diagnosis not present

## 2016-08-31 DIAGNOSIS — H401112 Primary open-angle glaucoma, right eye, moderate stage: Secondary | ICD-10-CM | POA: Diagnosis not present

## 2016-08-31 DIAGNOSIS — H401121 Primary open-angle glaucoma, left eye, mild stage: Secondary | ICD-10-CM | POA: Diagnosis not present

## 2016-08-31 DIAGNOSIS — H04123 Dry eye syndrome of bilateral lacrimal glands: Secondary | ICD-10-CM | POA: Diagnosis not present

## 2016-10-30 DIAGNOSIS — Z1231 Encounter for screening mammogram for malignant neoplasm of breast: Secondary | ICD-10-CM | POA: Diagnosis not present

## 2016-10-30 DIAGNOSIS — Z01419 Encounter for gynecological examination (general) (routine) without abnormal findings: Secondary | ICD-10-CM | POA: Diagnosis not present

## 2016-10-30 DIAGNOSIS — R634 Abnormal weight loss: Secondary | ICD-10-CM | POA: Diagnosis not present

## 2016-10-30 DIAGNOSIS — Z6823 Body mass index (BMI) 23.0-23.9, adult: Secondary | ICD-10-CM | POA: Diagnosis not present

## 2016-11-20 DIAGNOSIS — L255 Unspecified contact dermatitis due to plants, except food: Secondary | ICD-10-CM | POA: Diagnosis not present

## 2016-11-20 DIAGNOSIS — E785 Hyperlipidemia, unspecified: Secondary | ICD-10-CM | POA: Diagnosis not present

## 2016-11-20 DIAGNOSIS — I10 Essential (primary) hypertension: Secondary | ICD-10-CM | POA: Diagnosis not present

## 2016-11-20 DIAGNOSIS — N951 Menopausal and female climacteric states: Secondary | ICD-10-CM | POA: Diagnosis not present

## 2016-12-06 DIAGNOSIS — E785 Hyperlipidemia, unspecified: Secondary | ICD-10-CM | POA: Diagnosis not present

## 2016-12-10 DIAGNOSIS — R918 Other nonspecific abnormal finding of lung field: Secondary | ICD-10-CM | POA: Insufficient documentation

## 2017-01-09 DIAGNOSIS — H43393 Other vitreous opacities, bilateral: Secondary | ICD-10-CM | POA: Diagnosis not present

## 2017-01-09 DIAGNOSIS — H401121 Primary open-angle glaucoma, left eye, mild stage: Secondary | ICD-10-CM | POA: Diagnosis not present

## 2017-01-09 DIAGNOSIS — H401112 Primary open-angle glaucoma, right eye, moderate stage: Secondary | ICD-10-CM | POA: Diagnosis not present

## 2017-01-09 DIAGNOSIS — H04123 Dry eye syndrome of bilateral lacrimal glands: Secondary | ICD-10-CM | POA: Diagnosis not present

## 2017-01-09 DIAGNOSIS — H2513 Age-related nuclear cataract, bilateral: Secondary | ICD-10-CM | POA: Diagnosis not present

## 2017-03-06 DIAGNOSIS — N760 Acute vaginitis: Secondary | ICD-10-CM | POA: Diagnosis not present

## 2017-03-07 DIAGNOSIS — Z0101 Encounter for examination of eyes and vision with abnormal findings: Secondary | ICD-10-CM | POA: Diagnosis not present

## 2017-03-07 DIAGNOSIS — R69 Illness, unspecified: Secondary | ICD-10-CM | POA: Diagnosis not present

## 2017-03-07 DIAGNOSIS — I2510 Atherosclerotic heart disease of native coronary artery without angina pectoris: Secondary | ICD-10-CM | POA: Diagnosis not present

## 2017-03-07 DIAGNOSIS — S82142D Displaced bicondylar fracture of left tibia, subsequent encounter for closed fracture with routine healing: Secondary | ICD-10-CM | POA: Diagnosis not present

## 2017-03-07 DIAGNOSIS — M6281 Muscle weakness (generalized): Secondary | ICD-10-CM | POA: Diagnosis not present

## 2017-03-07 DIAGNOSIS — R262 Difficulty in walking, not elsewhere classified: Secondary | ICD-10-CM | POA: Diagnosis not present

## 2017-03-14 DIAGNOSIS — H2513 Age-related nuclear cataract, bilateral: Secondary | ICD-10-CM | POA: Diagnosis not present

## 2017-03-14 DIAGNOSIS — R69 Illness, unspecified: Secondary | ICD-10-CM | POA: Diagnosis not present

## 2017-03-14 DIAGNOSIS — Z0101 Encounter for examination of eyes and vision with abnormal findings: Secondary | ICD-10-CM | POA: Diagnosis not present

## 2017-03-14 DIAGNOSIS — H401131 Primary open-angle glaucoma, bilateral, mild stage: Secondary | ICD-10-CM | POA: Diagnosis not present

## 2017-03-14 DIAGNOSIS — M6281 Muscle weakness (generalized): Secondary | ICD-10-CM | POA: Diagnosis not present

## 2017-03-14 DIAGNOSIS — I2510 Atherosclerotic heart disease of native coronary artery without angina pectoris: Secondary | ICD-10-CM | POA: Diagnosis not present

## 2017-04-30 DIAGNOSIS — I10 Essential (primary) hypertension: Secondary | ICD-10-CM | POA: Diagnosis not present

## 2017-04-30 DIAGNOSIS — R51 Headache: Secondary | ICD-10-CM | POA: Diagnosis not present

## 2017-05-21 DIAGNOSIS — R7309 Other abnormal glucose: Secondary | ICD-10-CM | POA: Diagnosis not present

## 2017-05-21 DIAGNOSIS — Z139 Encounter for screening, unspecified: Secondary | ICD-10-CM | POA: Diagnosis not present

## 2017-05-21 DIAGNOSIS — I10 Essential (primary) hypertension: Secondary | ICD-10-CM | POA: Diagnosis not present

## 2017-05-21 DIAGNOSIS — E785 Hyperlipidemia, unspecified: Secondary | ICD-10-CM | POA: Diagnosis not present

## 2017-05-21 DIAGNOSIS — Z0000 Encounter for general adult medical examination without abnormal findings: Secondary | ICD-10-CM | POA: Diagnosis not present

## 2017-05-21 DIAGNOSIS — Z0001 Encounter for general adult medical examination with abnormal findings: Secondary | ICD-10-CM | POA: Diagnosis not present

## 2017-05-21 DIAGNOSIS — E559 Vitamin D deficiency, unspecified: Secondary | ICD-10-CM | POA: Diagnosis not present

## 2017-05-21 DIAGNOSIS — I7300 Raynaud's syndrome without gangrene: Secondary | ICD-10-CM | POA: Diagnosis not present

## 2017-08-22 DIAGNOSIS — E785 Hyperlipidemia, unspecified: Secondary | ICD-10-CM | POA: Diagnosis not present

## 2017-08-22 DIAGNOSIS — N959 Unspecified menopausal and perimenopausal disorder: Secondary | ICD-10-CM | POA: Diagnosis not present

## 2017-08-22 DIAGNOSIS — I10 Essential (primary) hypertension: Secondary | ICD-10-CM | POA: Diagnosis not present

## 2017-08-22 DIAGNOSIS — I7300 Raynaud's syndrome without gangrene: Secondary | ICD-10-CM | POA: Diagnosis not present

## 2017-09-10 DIAGNOSIS — R69 Illness, unspecified: Secondary | ICD-10-CM | POA: Diagnosis not present

## 2017-10-02 DIAGNOSIS — H401112 Primary open-angle glaucoma, right eye, moderate stage: Secondary | ICD-10-CM | POA: Diagnosis not present

## 2017-10-02 DIAGNOSIS — H2513 Age-related nuclear cataract, bilateral: Secondary | ICD-10-CM | POA: Diagnosis not present

## 2017-10-02 DIAGNOSIS — H04123 Dry eye syndrome of bilateral lacrimal glands: Secondary | ICD-10-CM | POA: Diagnosis not present

## 2017-10-02 DIAGNOSIS — H401121 Primary open-angle glaucoma, left eye, mild stage: Secondary | ICD-10-CM | POA: Diagnosis not present

## 2017-11-06 DIAGNOSIS — N959 Unspecified menopausal and perimenopausal disorder: Secondary | ICD-10-CM | POA: Diagnosis not present

## 2017-11-06 DIAGNOSIS — L309 Dermatitis, unspecified: Secondary | ICD-10-CM | POA: Diagnosis not present

## 2017-11-06 DIAGNOSIS — Z1231 Encounter for screening mammogram for malignant neoplasm of breast: Secondary | ICD-10-CM | POA: Diagnosis not present

## 2017-11-06 DIAGNOSIS — Z01419 Encounter for gynecological examination (general) (routine) without abnormal findings: Secondary | ICD-10-CM | POA: Diagnosis not present

## 2017-11-28 DIAGNOSIS — E785 Hyperlipidemia, unspecified: Secondary | ICD-10-CM | POA: Diagnosis not present

## 2017-11-28 DIAGNOSIS — R0602 Shortness of breath: Secondary | ICD-10-CM | POA: Diagnosis not present

## 2017-11-28 DIAGNOSIS — I10 Essential (primary) hypertension: Secondary | ICD-10-CM | POA: Diagnosis not present

## 2017-11-28 DIAGNOSIS — J309 Allergic rhinitis, unspecified: Secondary | ICD-10-CM | POA: Diagnosis not present

## 2017-12-04 ENCOUNTER — Ambulatory Visit
Admission: RE | Admit: 2017-12-04 | Discharge: 2017-12-04 | Disposition: A | Discharge status: Home / Self Care | Payer: Medicare HMO | Source: Ambulatory Visit | Attending: Internal Medicine | Admitting: Internal Medicine

## 2017-12-04 ENCOUNTER — Other Ambulatory Visit: Payer: Self-pay | Admitting: Internal Medicine

## 2017-12-04 DIAGNOSIS — R05 Cough: Secondary | ICD-10-CM

## 2017-12-04 DIAGNOSIS — R059 Cough, unspecified: Secondary | ICD-10-CM

## 2017-12-04 DIAGNOSIS — R0602 Shortness of breath: Secondary | ICD-10-CM

## 2017-12-10 ENCOUNTER — Other Ambulatory Visit: Payer: Self-pay | Admitting: Internal Medicine

## 2017-12-10 DIAGNOSIS — J189 Pneumonia, unspecified organism: Secondary | ICD-10-CM

## 2017-12-11 ENCOUNTER — Ambulatory Visit
Admission: RE | Admit: 2017-12-11 | Discharge: 2017-12-11 | Disposition: A | Discharge status: Home / Self Care | Payer: Medicare HMO | Source: Ambulatory Visit | Attending: Internal Medicine | Admitting: Internal Medicine

## 2017-12-11 DIAGNOSIS — E041 Nontoxic single thyroid nodule: Secondary | ICD-10-CM | POA: Insufficient documentation

## 2017-12-11 DIAGNOSIS — J90 Pleural effusion, not elsewhere classified: Secondary | ICD-10-CM | POA: Insufficient documentation

## 2017-12-11 DIAGNOSIS — J189 Pneumonia, unspecified organism: Secondary | ICD-10-CM

## 2017-12-11 DIAGNOSIS — R918 Other nonspecific abnormal finding of lung field: Secondary | ICD-10-CM | POA: Insufficient documentation

## 2017-12-11 MED ORDER — IOPAMIDOL (ISOVUE-300) INJECTION 61%
75.0000 mL | Freq: Once | INTRAVENOUS | Status: AC | PRN
  Administered 2017-12-11: 75 mL via INTRAVENOUS

## 2017-12-13 ENCOUNTER — Encounter: Payer: Self-pay | Admitting: General Practice

## 2017-12-13 NOTE — Progress Notes (Signed)
[facility] CSW Progress Notes  Call from patient, wants to come to [facility].  CSW spoke w patient, group is not appropriate for patient at this time until she has diagnosis and treatment plan.  Edwyna Shell, LCSW Clinical Social Worker Phone:  [phone]

## 2017-12-18 ENCOUNTER — Other Ambulatory Visit: Payer: Self-pay

## 2017-12-18 ENCOUNTER — Institutional Professional Consult (permissible substitution): Payer: Medicare HMO | Admitting: Cardiothoracic Surgery

## 2017-12-18 ENCOUNTER — Encounter: Payer: Self-pay | Admitting: *Deleted

## 2017-12-18 ENCOUNTER — Other Ambulatory Visit: Payer: Self-pay | Admitting: *Deleted

## 2017-12-18 ENCOUNTER — Encounter: Payer: Self-pay | Admitting: Cardiothoracic Surgery

## 2017-12-18 VITALS — BP 162/82 | HR 56 | Resp 18 | Ht 64.0 in | Wt 127.2 lb

## 2017-12-18 DIAGNOSIS — R918 Other nonspecific abnormal finding of lung field: Secondary | ICD-10-CM | POA: Diagnosis not present

## 2017-12-18 DIAGNOSIS — R911 Solitary pulmonary nodule: Secondary | ICD-10-CM

## 2017-12-18 DIAGNOSIS — J90 Pleural effusion, not elsewhere classified: Secondary | ICD-10-CM

## 2017-12-18 NOTE — Progress Notes (Signed)
PCP is [name], MD Referring Provider is [name], MD  Chief Complaint  Patient presents with  . Pleural Effusion    new patient consultation, Chest CT 12/11/2017  . Lung Mass   Patient examined, chest x-ray and CT scan of chest performed last month personally reviewed and counseled with patient  HPI: Very nice 67 year old female never smoker presents for evaluation of recently diagnosed right pleural effusion and 2 cm right upper lobe mass.  Patient began having pulmonary symptoms of cough, congestion, shortness of breath and fatigue associated with 5 to 10 pound weight loss.  She was treated with a course of oral antibiotic [moxifloxacin] without improvement.  Follow-up CT scan showed a moderate right pleural effusion with evidence of loculation at the base with a visible 2.5 cm right upper lobe irregular mass.  Because of concern over neoplasm the patient was referred to thoracic surgery for biopsy-tissue diagnosis.  The patient denies bone pain.  There is no family history of lung cancer.  No history of dental disease or aspiration.  No history of thoracic trauma.  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  Past Medical History:  Diagnosis Date  . Arthritis   . Glaucoma   . Hypertension   . Raynaud's disease     Past Surgical History:  Procedure Laterality Date  . ABDOMINAL HYSTERECTOMY    . ROTATOR CUFF REPAIR      Family History  Problem Relation Age of Onset  . Heart disease Sister   . Heart disease Brother     Social History Social History   Tobacco Use  . Smoking status: Never Smoker  . Smokeless tobacco: Never Used  Substance Use Topics  . Alcohol use: Yes  . Drug use: No    Current Outpatient Medications  Medication Sig Dispense Refill  . albuterol (PROVENTIL HFA;VENTOLIN HFA) 108 (90 Base) MCG/ACT inhaler TAKE 2 PUFFS BY MOUTH EVERY 4 HOURS AS NEEDED  3  . amitriptyline (ELAVIL) 10 MG tablet Take 10 mg by  mouth at bedtime.    Marland Kitchen amLODipine (NORVASC) 5 MG tablet Take 5 mg by mouth daily.  3  . B Complex-Biotin-FA (B COMPLETE) TABS Take 1 tablet by mouth.    . brimonidine (ALPHAGAN P) 0.1 % SOLN Alphagan P 0.1 % eye drops    . clindamycin (CLEOCIN) 150 MG capsule Take 1 capsule (150 mg total) by mouth 3 (three) times daily. 40 capsule 0  . cromolyn (OPTICROM) 4 % ophthalmic solution Place 1 drop into both eyes 4 (four) times daily as needed.    . dorzolamide-timolol (COSOPT) 22.3-6.8 MG/ML ophthalmic solution dorzolamide 22.3 mg-timolol 6.8 mg/mL eye drops    . Estradiol-Estriol-Progesterone (BIEST/PROGESTERONE) CREA Place 1 application onto the skin 3 (three) times a week.    . estrogens, conjugated, (PREMARIN) 0.625 MG tablet Take 0.625 mg by mouth daily. Take daily for 21 days then do not take for 7 days.    . fenofibrate 160 MG tablet fenofibrate 160 mg tablet  TAKE 1 TABLET BY MOUTH EVERY DAY    . hydrochlorothiazide (HYDRODIURIL) 25 MG tablet Take 25 mg by mouth daily.  4  . latanoprost (XALATAN) 0.005 % ophthalmic solution     . moxifloxacin (AVELOX) 400 MG tablet Take 400 mg by mouth daily.  0  . OVER THE COUNTER MEDICATION 1 tablet.    Marland Kitchen OVER THE COUNTER MEDICATION 1 tablet.    . triamcinolone cream (KENALOG) 0.1 %  Apply 1 application topically 2 (two) times daily. 30 g 0   No current facility-administered medications for this visit.     Allergies  Allergen Reactions  . Penicillins   . Vicodin [Hydrocodone-Acetaminophen]     Review of Systems                    Review of Systems :  [ y ] = yes, [  ] = no        General :  Weight gain [   ]    Weight loss  Allison [name]   ]  Fatigue Allison [name]  ]  Fever [  ]  Chills  [  ]                                          HEENT    Headache [  ]  Dizziness [  ]  Blurred vision [  ] Glaucoma  [  ]                          Nosebleeds [  ] Painful or loose teeth [  ]        Cardiac :  Chest pain/ pressure Allison [name]  ]  Resting SOB [  ] exertional SOB Allison [name]  ]                         [name] [  ]  Pedal edema  [  ]  Palpitations [  ] Syncope/presyncope [ ]                         Paroxysmal nocturnal dyspnea [  ]         Pulmonary : cough [  y]  wheezing [  ]  Hemoptysis [  ] Sputum Allison [name] ] Snoring [  ]                              Pneumothorax [  ]  Sleep apnea [  ]        GI : Vomiting [  ]  Dysphagia [  ]  Melena  [  ]  Abdominal pain [  ] BRBPR [  ]              Heart burn [  ]  Constipation [  ] Diarrhea  [  ] Colonoscopy [   ]        GU : Hematuria [  ]  Dysuria [  ]  Nocturia [  ] UTI's [  ]        Vascular : Claudication [  ]  Rest pain [  ]  DVT [  ] Vein stripping [  ] leg ulcers [  ]                          TIA [  ] Stroke [  ]  Varicose veins [  ]        NEURO :  Headaches  [  ] Seizures [  ] Vision changes [  ] Paresthesias [  ]            right-hand-dominant  Musculoskeletal :  Arthritis [ y ] Gout  [  ]  Back pain [  ]  Joint pain [  ]        Skin :  Rash [  ]  Melanoma [  ] Sores [  ]        Heme : Bleeding problems [  ]Clotting Disorders [  ] Anemia [  ]Blood Transfusion [ ]         Endocrine : Diabetes [  ] Heat or Cold intolerance [  ] Polyuria [  ]excessive thirst [ ]         Psych : Depression [  ]  Anxiety [  ]  Psych hospitalizations [  ] Memory change [  ]                                                                            BP (!) 162/82 (BP Location: Left Arm, Patient Position: Sitting, Cuff Size: Normal)   Pulse (!) 56   Resp 18   Ht 5\' 4"  (1.626 m)   Wt 127 lb 3.2 oz (57.7 kg)   SpO2 98% Comment: RA  BMI 21.83 kg/m  Physical Exam     Physical Exam  General: Very nice middle-aged female no acute distress HEENT: Normocephalic pupils equal , dentition adequate Neck: Supple without JVD, adenopathy, or bruit Chest: Diminished breath sounds at right base, clear on left,, no rhonchi, no tenderness             or deformity Cardiovascular: Regular rate and rhythm, no  murmur, no gallop, peripheral pulses             palpable in all extremities Abdomen:  Soft, nontender, no palpable mass or organomegaly Extremities: Warm, well-perfused, no clubbing cyanosis edema or tenderness,              no venous stasis changes of the legs Rectal/GU: Deferred Neuro: Grossly non--focal and symmetrical throughout Skin: Clean and dry without rash or ulceration   Diagnostic Tests: CT scan of chest reviewed showing a basilar probably loculated pleural effusion with a visible 2.5 cm irregular mass in the right upper lobe.  Some surrounding smaller nodules, could be satellite nodules.  No significant mediastinal adenopathy.  Impression: Right pleural effusion with right upper lobe mass.  Suspicion for neoplastic process.  Patient will be scheduled for a PET scan and will have the right pleural effusion drained and sent for cytology and cultures.  She will return after the above events.  If cultures negative and cytology negative we will discuss bronchoscopy and endobronchial biopsy with patient at next visit.   Plan: Return in 7-10 days after PET scan and thoracentesis with cytology of right pleural fluid.  Len Childs, MD [facility] [phone]

## 2017-12-19 ENCOUNTER — Ambulatory Visit (HOSPITAL_COMMUNITY)
Admission: RE | Admit: 2017-12-19 | Discharge: 2017-12-19 | Disposition: A | Discharge status: Home / Self Care | Payer: Medicare HMO | Source: Ambulatory Visit | Attending: Radiology | Admitting: Radiology

## 2017-12-19 ENCOUNTER — Encounter: Payer: Medicare HMO | Admitting: Cardiothoracic Surgery

## 2017-12-19 ENCOUNTER — Ambulatory Visit (HOSPITAL_COMMUNITY)
Admission: RE | Admit: 2017-12-19 | Discharge: 2017-12-19 | Disposition: A | Discharge status: Home / Self Care | Payer: Medicare HMO | Source: Ambulatory Visit | Attending: Cardiothoracic Surgery | Admitting: Cardiothoracic Surgery

## 2017-12-19 ENCOUNTER — Encounter: Payer: Self-pay | Admitting: Internal Medicine

## 2017-12-19 DIAGNOSIS — Z9889 Other specified postprocedural states: Secondary | ICD-10-CM | POA: Diagnosis present

## 2017-12-19 DIAGNOSIS — C3491 Malignant neoplasm of unspecified part of right bronchus or lung: Secondary | ICD-10-CM | POA: Insufficient documentation

## 2017-12-19 DIAGNOSIS — C3492 Malignant neoplasm of unspecified part of left bronchus or lung: Secondary | ICD-10-CM | POA: Diagnosis not present

## 2017-12-19 DIAGNOSIS — C384 Malignant neoplasm of pleura: Secondary | ICD-10-CM | POA: Diagnosis not present

## 2017-12-19 DIAGNOSIS — J910 Malignant pleural effusion: Secondary | ICD-10-CM | POA: Diagnosis not present

## 2017-12-19 DIAGNOSIS — J90 Pleural effusion, not elsewhere classified: Secondary | ICD-10-CM | POA: Diagnosis not present

## 2017-12-19 MED ORDER — LIDOCAINE HCL 1 % IJ SOLN
INTRAMUSCULAR | Status: AC
  Filled 2017-12-19: qty 20

## 2017-12-19 NOTE — Procedures (Addendum)
Ultrasound-guided diagnostic and therapeutic right thoracentesis performed yielding 1.4 liters of yellow fluid. No immediate complications. Follow-up chest x-ray pending.The fluid was sent to the lab for preordered studies. Due to pt coughing/ shoulder discomfort/initial thoracentesis only the above amount of fluid was removed today.

## 2017-12-20 LAB — GRAM STAIN

## 2017-12-24 LAB — CULTURE, BODY FLUID W GRAM STAIN -BOTTLE: Culture: NO GROWTH

## 2017-12-26 ENCOUNTER — Ambulatory Visit (HOSPITAL_COMMUNITY)
Admission: RE | Admit: 2017-12-26 | Discharge: 2017-12-26 | Disposition: A | Discharge status: Home / Self Care | Payer: Medicare HMO | Source: Ambulatory Visit | Attending: Cardiothoracic Surgery | Admitting: Cardiothoracic Surgery

## 2017-12-26 DIAGNOSIS — J90 Pleural effusion, not elsewhere classified: Secondary | ICD-10-CM | POA: Insufficient documentation

## 2017-12-26 DIAGNOSIS — Z79899 Other long term (current) drug therapy: Secondary | ICD-10-CM | POA: Insufficient documentation

## 2017-12-26 DIAGNOSIS — R918 Other nonspecific abnormal finding of lung field: Secondary | ICD-10-CM | POA: Insufficient documentation

## 2017-12-26 DIAGNOSIS — I700 Atherosclerosis of aorta: Secondary | ICD-10-CM | POA: Diagnosis not present

## 2017-12-26 DIAGNOSIS — C3491 Malignant neoplasm of unspecified part of right bronchus or lung: Secondary | ICD-10-CM | POA: Diagnosis not present

## 2017-12-26 DIAGNOSIS — R911 Solitary pulmonary nodule: Secondary | ICD-10-CM

## 2017-12-26 LAB — GLUCOSE, CAPILLARY: Glucose-Capillary: 87 mg/dL (ref 70–99)

## 2017-12-26 MED ORDER — FLUDEOXYGLUCOSE F - 18 (FDG) INJECTION
6.2000 | Freq: Once | INTRAVENOUS | Status: AC
  Administered 2017-12-26: 6.2 via INTRAVENOUS

## 2017-12-27 ENCOUNTER — Other Ambulatory Visit: Payer: Self-pay

## 2017-12-27 ENCOUNTER — Other Ambulatory Visit: Payer: Self-pay | Admitting: *Deleted

## 2017-12-27 ENCOUNTER — Encounter: Payer: Self-pay | Admitting: *Deleted

## 2017-12-27 ENCOUNTER — Ambulatory Visit: Payer: Medicare HMO | Admitting: Cardiothoracic Surgery

## 2017-12-27 ENCOUNTER — Encounter: Payer: Self-pay | Admitting: Cardiothoracic Surgery

## 2017-12-27 VITALS — BP 154/60 | HR 63 | Resp 18 | Ht 64.0 in | Wt 126.8 lb

## 2017-12-27 DIAGNOSIS — J910 Malignant pleural effusion: Secondary | ICD-10-CM | POA: Diagnosis not present

## 2017-12-27 DIAGNOSIS — R918 Other nonspecific abnormal finding of lung field: Secondary | ICD-10-CM | POA: Diagnosis not present

## 2017-12-27 NOTE — Progress Notes (Signed)
Oncology Nurse Navigator Documentation  Oncology Nurse Navigator Flowsheets 12/27/2017  Navigator Location [facility]  Navigator Encounter Type Other/per Cancer Conference and Dr. Darcey Nora, molecular testing and PDL 1 requested   Abnormal Finding Date 12/11/2017  Confirmed Diagnosis Date 12/19/2017  Treatment Phase Pre-Tx/Tx Discussion  Barriers/Navigation Needs Coordination of Care  Interventions Coordination of Care  Coordination of Care Other  Acuity Level 2  Time Spent with Patient 30

## 2017-12-27 NOTE — Progress Notes (Signed)
PCP is [name], MD Referring Provider is [name], MD  Chief Complaint  Patient presents with  . Pleural Effusion    f/u after thoracentesis 12/19/17  . Lung Mass    PET today 12/27/17    HPI: Patient returns for further discussion of 2.5 cm right upper lobe related mass and right pleural effusion.  Patient is a non-smoker.  Thoracentesis removed 1.4 L of dark fluid with malignant cells positive for adenocarcinoma-lung primary.  PET scan shows hypermetabolic activity of the primary upper lobe mass as well as hypermetabolic activity of right hilar node and right mediastinal paratracheal node.  No evidence of distant metastatic disease.  Patient remains fairly asymptomatic but has had some coughing.  The PET scan was performed 6-7 days after the thoracentesis and there was already reaccumulation of the right pleural effusion.   Past Medical History:  Diagnosis Date  . Arthritis   . Glaucoma   . Hypertension   . Raynaud's disease     Past Surgical History:  Procedure Laterality Date  . ABDOMINAL HYSTERECTOMY    . ROTATOR CUFF REPAIR      Family History  Problem Relation Age of Onset  . Heart disease Sister   . Heart disease Brother     Social History Social History   Tobacco Use  . Smoking status: Never Smoker  . Smokeless tobacco: Never Used  Substance Use Topics  . Alcohol use: Yes  . Drug use: No    Current Outpatient Medications  Medication Sig Dispense Refill  . albuterol (PROVENTIL HFA;VENTOLIN HFA) 108 (90 Base) MCG/ACT inhaler TAKE 2 PUFFS BY MOUTH EVERY 4 HOURS AS NEEDED  3  . ALPRAZolam (XANAX) 0.25 MG tablet alprazolam 0.25 mg tablet    . amitriptyline (ELAVIL) 10 MG tablet Take 10 mg by mouth at bedtime.    Marland Kitchen amLODipine (NORVASC) 5 MG tablet Take 5 mg by mouth daily.  3  . B Complex-Biotin-FA (B COMPLETE) TABS Take 1 tablet by mouth.    . brimonidine (ALPHAGAN P) 0.1 % SOLN Alphagan P 0.1 % eye drops    . clindamycin (CLEOCIN T) 1 % SWAB  Apply 1 application topically 2 (two) times daily.    . cromolyn (OPTICROM) 4 % ophthalmic solution Place 1 drop into both eyes 4 (four) times daily as needed.    . dorzolamide-timolol (COSOPT) 22.3-6.8 MG/ML ophthalmic solution dorzolamide 22.3 mg-timolol 6.8 mg/mL eye drops    . Estradiol-Estriol-Progesterone (BIEST/PROGESTERONE) CREA Place 1 application onto the skin 3 (three) times a week.    . estrogens, conjugated, (PREMARIN) 0.625 MG tablet 0.625 mg daily. Take daily for 21 days then do not take for 7 days.     . fenofibrate 160 MG tablet fenofibrate 160 mg tablet  TAKE 1 TABLET BY MOUTH EVERY DAY    . hydrochlorothiazide (HYDRODIURIL) 25 MG tablet Take 25 mg by mouth daily.  4  . latanoprost (XALATAN) 0.005 % ophthalmic solution     . moxifloxacin (AVELOX) 400 MG tablet Take 400 mg by mouth daily.  0  . OVER THE COUNTER MEDICATION 1 tablet.    Marland Kitchen OVER THE COUNTER MEDICATION 1 tablet.    . triamcinolone cream (KENALOG) 0.1 % Apply 1 application topically 2 (two) times daily. 30 g 0   No current facility-administered medications for this visit.     Allergies  Allergen Reactions  . Penicillins   . Vicodin [Hydrocodone-Acetaminophen]     Review of Systems no fever   Weight stable No  symptoms of DVT  BP (!) 154/60 (BP Location: Left Arm, Patient Position: Sitting, Cuff Size: Normal)   Pulse 63   Resp 18   Ht 5\' 4"  (1.626 m)   Wt 126 lb 12.8 oz (57.5 kg)   SpO2 98% Comment: RA  BMI 21.77 kg/m  Physical Exam      Exam    General- alert and comfortable    Neck- no JVD, no cervical adenopathy palpable, no carotid bruit   Lungs-diminished breath sounds right base without rales, wheezes   Cor- regular rate and rhythm, no murmur , gallop   Abdomen- soft, non-tender   Extremities - warm, non-tender, minimal edema   Neuro- oriented, appropriate, no focal weakness   Diagnostic Tests: PET scan images personally reviewed and discussed with patient and  family  Impression: 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  Plan: Patient will be referred to Dr. Julien Nordmann, thoracic oncology. Patient will be scheduled for placement of right Pleurx catheter to manage the recurring right malignant pleural effusion.  Date of surgery is July 16.  Len Childs, MD [facility] [phone]

## 2017-12-28 ENCOUNTER — Other Ambulatory Visit: Payer: Self-pay | Admitting: *Deleted

## 2017-12-28 ENCOUNTER — Telehealth: Payer: Self-pay | Admitting: *Deleted

## 2017-12-28 ENCOUNTER — Encounter: Payer: Self-pay | Admitting: *Deleted

## 2017-12-28 DIAGNOSIS — R918 Other nonspecific abnormal finding of lung field: Secondary | ICD-10-CM

## 2017-12-28 DIAGNOSIS — J90 Pleural effusion, not elsewhere classified: Secondary | ICD-10-CM

## 2017-12-28 NOTE — Progress Notes (Signed)
Oncology Nurse Navigator Documentation  Oncology Nurse Navigator Flowsheets 12/28/2017  Navigator Location [facility]  Navigator Encounter Type Telephone/I received referral on [name].  I called her with an appt to be seen with Dr. Julien Nordmann at [facility] next week on 01/03/18 arrive at 1:30.  She verbalized understanding of appt time and place   Telephone Outgoing Call  Treatment Phase Pre-Tx/Tx Discussion  Barriers/Navigation Needs Education;Coordination of Care  Education Other  Interventions Coordination of Care;Education  Coordination of Care Appts  Education Method Verbal  Acuity Level 2  Time Spent with Patient 30

## 2017-12-31 ENCOUNTER — Encounter (HOSPITAL_COMMUNITY): Payer: Self-pay | Admitting: *Deleted

## 2017-12-31 ENCOUNTER — Other Ambulatory Visit: Payer: Self-pay

## 2017-12-31 NOTE — Progress Notes (Signed)
Pt denies any acute cardiopulmonary issues. Pt not under the care of a cardiologist. Pt denies having a cardiac cath and echo but stated that a stress test was performed; requested records from Dr. [name]. Pt denies having an EKG within the last year. Pt made aware to stop taking vitamins, fish oil and herbal medications. Do not take any NSAIDs ie: Ibuprofen, Advil, Naproxen (Aleve), Motrin, BC and Goody Powder. Pt stated that she does not take Aspirin. Pt verbalized understanding of all pre-op instructions.

## 2018-01-01 ENCOUNTER — Ambulatory Visit (HOSPITAL_COMMUNITY): Payer: Medicare HMO | Admitting: Anesthesiology

## 2018-01-01 ENCOUNTER — Encounter (HOSPITAL_COMMUNITY)
Admission: RE | Disposition: A | Discharge status: Home / Self Care | Payer: Self-pay | Source: Ambulatory Visit | Attending: Cardiothoracic Surgery

## 2018-01-01 ENCOUNTER — Ambulatory Visit (HOSPITAL_COMMUNITY): Payer: Medicare HMO

## 2018-01-01 ENCOUNTER — Encounter (HOSPITAL_COMMUNITY): Payer: Self-pay | Admitting: *Deleted

## 2018-01-01 ENCOUNTER — Ambulatory Visit (HOSPITAL_COMMUNITY)
Admission: RE | Admit: 2018-01-01 | Discharge: 2018-01-01 | Disposition: A | Discharge status: Home / Self Care | Payer: Medicare HMO | Source: Ambulatory Visit | Attending: Cardiothoracic Surgery | Admitting: Cardiothoracic Surgery

## 2018-01-01 DIAGNOSIS — I7300 Raynaud's syndrome without gangrene: Secondary | ICD-10-CM | POA: Diagnosis not present

## 2018-01-01 DIAGNOSIS — J45909 Unspecified asthma, uncomplicated: Secondary | ICD-10-CM | POA: Diagnosis not present

## 2018-01-01 DIAGNOSIS — J90 Pleural effusion, not elsewhere classified: Secondary | ICD-10-CM

## 2018-01-01 DIAGNOSIS — R7303 Prediabetes: Secondary | ICD-10-CM | POA: Diagnosis not present

## 2018-01-01 DIAGNOSIS — Z85118 Personal history of other malignant neoplasm of bronchus and lung: Secondary | ICD-10-CM | POA: Insufficient documentation

## 2018-01-01 DIAGNOSIS — R69 Illness, unspecified: Secondary | ICD-10-CM | POA: Diagnosis not present

## 2018-01-01 DIAGNOSIS — J910 Malignant pleural effusion: Secondary | ICD-10-CM | POA: Insufficient documentation

## 2018-01-01 DIAGNOSIS — Z419 Encounter for procedure for purposes other than remedying health state, unspecified: Secondary | ICD-10-CM

## 2018-01-01 DIAGNOSIS — Z9689 Presence of other specified functional implants: Secondary | ICD-10-CM

## 2018-01-01 DIAGNOSIS — K219 Gastro-esophageal reflux disease without esophagitis: Secondary | ICD-10-CM | POA: Insufficient documentation

## 2018-01-01 DIAGNOSIS — F329 Major depressive disorder, single episode, unspecified: Secondary | ICD-10-CM | POA: Insufficient documentation

## 2018-01-01 DIAGNOSIS — F419 Anxiety disorder, unspecified: Secondary | ICD-10-CM | POA: Insufficient documentation

## 2018-01-01 DIAGNOSIS — I10 Essential (primary) hypertension: Secondary | ICD-10-CM | POA: Insufficient documentation

## 2018-01-01 DIAGNOSIS — Z79899 Other long term (current) drug therapy: Secondary | ICD-10-CM | POA: Insufficient documentation

## 2018-01-01 DIAGNOSIS — C3491 Malignant neoplasm of unspecified part of right bronchus or lung: Secondary | ICD-10-CM | POA: Diagnosis not present

## 2018-01-01 DIAGNOSIS — Z452 Encounter for adjustment and management of vascular access device: Secondary | ICD-10-CM | POA: Diagnosis not present

## 2018-01-01 HISTORY — DX: Nausea with vomiting, unspecified: R11.2

## 2018-01-01 HISTORY — DX: Other specified postprocedural states: Z98.890

## 2018-01-01 HISTORY — PX: CHEST TUBE INSERTION: SHX231

## 2018-01-01 HISTORY — DX: Anxiety disorder, unspecified: F41.9

## 2018-01-01 HISTORY — DX: Gastro-esophageal reflux disease without esophagitis: K21.9

## 2018-01-01 HISTORY — DX: Major depressive disorder, single episode, unspecified: F32.9

## 2018-01-01 HISTORY — DX: Depression, unspecified: F32.A

## 2018-01-01 HISTORY — DX: Unspecified asthma, uncomplicated: J45.909

## 2018-01-01 HISTORY — DX: Prediabetes: R73.03

## 2018-01-01 HISTORY — DX: Malignant pleural effusion: J91.0

## 2018-01-01 LAB — SURGICAL PCR SCREEN
MRSA, PCR: NEGATIVE
Staphylococcus aureus: NEGATIVE

## 2018-01-01 LAB — COMPREHENSIVE METABOLIC PANEL
ALT: 19 U/L (ref 0–44)
AST: 23 U/L (ref 15–41)
Albumin: 4 g/dL (ref 3.5–5.0)
Alkaline Phosphatase: 49 U/L (ref 38–126)
Anion gap: 10 (ref 5–15)
BUN: 15 mg/dL (ref 8–23)
CO2: 24 mmol/L (ref 22–32)
Calcium: 9.6 mg/dL (ref 8.9–10.3)
Chloride: 106 mmol/L (ref 98–111)
Creatinine, Ser: 0.91 mg/dL (ref 0.44–1.00)
GFR calc Af Amer: >60 mL/min (ref >60)
GFR calc non Af Amer: >60 mL/min (ref >60)
Glucose, Bld: 90 mg/dL (ref 70–99)
Potassium: 3.6 mmol/L (ref 3.5–5.1)
Sodium: 140 mmol/L (ref 135–145)
Total Bilirubin: 0.5 mg/dL (ref 0.3–1.2)
Total Protein: 6.9 g/dL (ref 6.5–8.1)

## 2018-01-01 LAB — CBC
HCT: 41.4 % (ref 36.0–46.0)
Hemoglobin: 13.1 g/dL (ref 12.0–15.0)
MCH: 27.1 pg (ref 26.0–34.0)
MCHC: 31.6 g/dL (ref 30.0–36.0)
MCV: 85.5 fL (ref 78.0–100.0)
Platelets: 334 10*3/uL (ref 150–400)
RBC: 4.84 MIL/uL (ref 3.87–5.11)
RDW: 13.2 % (ref 11.5–15.5)
WBC: 5.1 10*3/uL (ref 4.0–10.5)

## 2018-01-01 LAB — PROTIME-INR
INR: 0.98
Prothrombin Time: 12.9 seconds (ref 11.4–15.2)

## 2018-01-01 LAB — APTT: aPTT: 31 seconds (ref 24–36)

## 2018-01-01 SURGERY — INSERTION, PLEURAL DRAINAGE CATHETER
Anesthesia: Monitor Anesthesia Care | Site: Chest | Laterality: Right

## 2018-01-01 MED ORDER — MIDAZOLAM HCL 5 MG/5ML IJ SOLN
INTRAMUSCULAR | Status: DC | PRN
  Administered 2018-01-01: 2 mg via INTRAVENOUS

## 2018-01-01 MED ORDER — ACETAMINOPHEN 650 MG RE SUPP: 650.0000 mg | RECTAL | Status: DC | PRN

## 2018-01-01 MED ORDER — SODIUM CHLORIDE 0.9% FLUSH: 3.0000 mL | Freq: Two times a day (BID) | INTRAVENOUS | Status: DC

## 2018-01-01 MED ORDER — 0.9 % SODIUM CHLORIDE (POUR BTL) OPTIME
TOPICAL | Status: DC | PRN
  Administered 2018-01-01: 1000 mL

## 2018-01-01 MED ORDER — ONDANSETRON HCL 4 MG/2ML IJ SOLN: 4.0000 mg | Freq: Once | INTRAMUSCULAR | Status: DC | PRN

## 2018-01-01 MED ORDER — LACTATED RINGERS IV SOLN
INTRAVENOUS | Status: DC | PRN
  Administered 2018-01-01: 13:00:00 via INTRAVENOUS

## 2018-01-01 MED ORDER — LIDOCAINE 2% (20 MG/ML) 5 ML SYRINGE
INTRAMUSCULAR | Status: AC
  Filled 2018-01-01: qty 5

## 2018-01-01 MED ORDER — LIDOCAINE 2% (20 MG/ML) 5 ML SYRINGE
INTRAMUSCULAR | Status: DC | PRN
  Administered 2018-01-01: 50 mg via INTRAVENOUS

## 2018-01-01 MED ORDER — FENTANYL CITRATE (PF) 100 MCG/2ML IJ SOLN: 25.0000 ug | INTRAMUSCULAR | Status: DC | PRN

## 2018-01-01 MED ORDER — FENTANYL CITRATE (PF) 250 MCG/5ML IJ SOLN
INTRAMUSCULAR | Status: AC
  Filled 2018-01-01: qty 5

## 2018-01-01 MED ORDER — FENTANYL CITRATE (PF) 100 MCG/2ML IJ SOLN
INTRAMUSCULAR | Status: AC
  Filled 2018-01-01: qty 2

## 2018-01-01 MED ORDER — MUPIROCIN 2 % EX OINT
TOPICAL_OINTMENT | CUTANEOUS | Status: AC
  Administered 2018-01-01: 1 via TOPICAL
  Filled 2018-01-01: qty 22

## 2018-01-01 MED ORDER — LIDOCAINE HCL 1 % IJ SOLN
INTRAMUSCULAR | Status: DC | PRN
  Administered 2018-01-01: 15 mL

## 2018-01-01 MED ORDER — VANCOMYCIN HCL IN DEXTROSE 1-5 GM/200ML-% IV SOLN
INTRAVENOUS | Status: AC
  Filled 2018-01-01: qty 200

## 2018-01-01 MED ORDER — SODIUM CHLORIDE 0.9% FLUSH: 3.0000 mL | INTRAVENOUS | Status: DC | PRN

## 2018-01-01 MED ORDER — VANCOMYCIN HCL IN DEXTROSE 1-5 GM/200ML-% IV SOLN: 1000.0000 mg | INTRAVENOUS | Status: DC

## 2018-01-01 MED ORDER — MIDAZOLAM HCL 2 MG/2ML IJ SOLN
INTRAMUSCULAR | Status: AC
  Filled 2018-01-01: qty 2

## 2018-01-01 MED ORDER — FENTANYL CITRATE (PF) 100 MCG/2ML IJ SOLN
INTRAMUSCULAR | Status: DC | PRN
  Administered 2018-01-01: 50 ug via INTRAVENOUS
  Administered 2018-01-01: 25 ug via INTRAVENOUS

## 2018-01-01 MED ORDER — DEXAMETHASONE SODIUM PHOSPHATE 10 MG/ML IJ SOLN
INTRAMUSCULAR | Status: AC
  Filled 2018-01-01: qty 1

## 2018-01-01 MED ORDER — PROPOFOL 10 MG/ML IV BOLUS
INTRAVENOUS | Status: DC | PRN
  Administered 2018-01-01: 30 mg via INTRAVENOUS

## 2018-01-01 MED ORDER — SODIUM CHLORIDE 0.9 % IV SOLN: 250.0000 mL | INTRAVENOUS | Status: DC | PRN

## 2018-01-01 MED ORDER — ONDANSETRON HCL 4 MG/2ML IJ SOLN
INTRAMUSCULAR | Status: AC
  Filled 2018-01-01: qty 2

## 2018-01-01 MED ORDER — FENTANYL CITRATE (PF) 100 MCG/2ML IJ SOLN
25.0000 ug | INTRAMUSCULAR | Status: DC | PRN
  Administered 2018-01-01 (×4): 25 ug via INTRAVENOUS

## 2018-01-01 MED ORDER — LIDOCAINE HCL (PF) 1 % IJ SOLN
INTRAMUSCULAR | Status: AC
Start: 2018-01-01 — End: ?
  Filled 2018-01-01: qty 30

## 2018-01-01 MED ORDER — PROPOFOL 10 MG/ML IV BOLUS
INTRAVENOUS | Status: AC
  Filled 2018-01-01: qty 20

## 2018-01-01 MED ORDER — MUPIROCIN 2 % EX OINT
1.0000 "application " | TOPICAL_OINTMENT | Freq: Once | CUTANEOUS | Status: AC
  Administered 2018-01-01: 1 via TOPICAL

## 2018-01-01 MED ORDER — VANCOMYCIN HCL 1000 MG IV SOLR
INTRAVENOUS | Status: DC | PRN
  Administered 2018-01-01: 1000 mg via INTRAVENOUS

## 2018-01-01 MED ORDER — ONDANSETRON HCL 4 MG/2ML IJ SOLN
INTRAMUSCULAR | Status: DC | PRN
  Administered 2018-01-01: 4 mg via INTRAVENOUS

## 2018-01-01 MED ORDER — KETOROLAC TROMETHAMINE 15 MG/ML IJ SOLN: 15.0000 mg | Freq: Four times a day (QID) | INTRAMUSCULAR | Status: DC

## 2018-01-01 MED ORDER — PROPOFOL 500 MG/50ML IV EMUL
INTRAVENOUS | Status: DC | PRN
  Administered 2018-01-01: 75 ug/kg/min via INTRAVENOUS

## 2018-01-01 MED ORDER — PROPOFOL 1000 MG/100ML IV EMUL
INTRAVENOUS | Status: AC
  Filled 2018-01-01: qty 100

## 2018-01-01 MED ORDER — ACETAMINOPHEN 325 MG PO TABS: 650.0000 mg | ORAL_TABLET | ORAL | Status: DC | PRN

## 2018-01-01 MED ORDER — DEXAMETHASONE SODIUM PHOSPHATE 4 MG/ML IJ SOLN
INTRAMUSCULAR | Status: DC | PRN
  Administered 2018-01-01: 8 mg via INTRAVENOUS

## 2018-01-01 MED ORDER — LACTATED RINGERS IV SOLN
Freq: Once | INTRAVENOUS | Status: AC
  Administered 2018-01-01: 13:00:00 via INTRAVENOUS

## 2018-01-01 SURGICAL SUPPLY — 32 items
ADH SKN CLS APL DERMABOND .7 (GAUZE/BANDAGES/DRESSINGS) ×1
CANISTER SUCT 3000ML PPV (MISCELLANEOUS) ×3 IMPLANT
COVER SURGICAL LIGHT HANDLE (MISCELLANEOUS) ×3 IMPLANT
DERMABOND ADVANCED (GAUZE/BANDAGES/DRESSINGS) ×2
DERMABOND ADVANCED .7 DNX12 (GAUZE/BANDAGES/DRESSINGS) ×1 IMPLANT
DRAPE C-ARM 42X72 X-RAY (DRAPES) ×3 IMPLANT
DRAPE LAPAROSCOPIC ABDOMINAL (DRAPES) ×3 IMPLANT
DRSG TEGADERM 4X4.75 (GAUZE/BANDAGES/DRESSINGS) ×3 IMPLANT
GLOVE BIO SURGEON STRL SZ 6.5 (GLOVE) ×4 IMPLANT
GLOVE BIO SURGEON STRL SZ7.5 (GLOVE) ×6 IMPLANT
GLOVE BIO SURGEONS STRL SZ 6.5 (GLOVE) ×2
GOWN STRL REUS W/ TWL LRG LVL3 (GOWN DISPOSABLE) ×3 IMPLANT
GOWN STRL REUS W/TWL LRG LVL3 (GOWN DISPOSABLE) ×9
KIT BASIN OR (CUSTOM PROCEDURE TRAY) ×3 IMPLANT
KIT PLEURX DRAIN CATH 1000ML (MISCELLANEOUS) ×3 IMPLANT
KIT PLEURX DRAIN CATH 15.5FR (DRAIN) ×3 IMPLANT
KIT TURNOVER KIT B (KITS) ×3 IMPLANT
NEEDLE HYPO 25GX1X1/2 BEV (NEEDLE) ×3 IMPLANT
NS IRRIG 1000ML POUR BTL (IV SOLUTION) ×3 IMPLANT
PACK GENERAL/GYN (CUSTOM PROCEDURE TRAY) ×3 IMPLANT
PAD ARMBOARD 7.5X6 YLW CONV (MISCELLANEOUS) ×6 IMPLANT
SET DRAINAGE LINE (MISCELLANEOUS) IMPLANT
SPONGE DRAIN TRACH 4X4 STRL 2S (GAUZE/BANDAGES/DRESSINGS) ×3 IMPLANT
SUT ETHILON 3 0 PS 1 (SUTURE) ×3 IMPLANT
SUT SILK 2 0 SH (SUTURE) ×3 IMPLANT
SUT VIC AB 3-0 SH 8-18 (SUTURE) ×3 IMPLANT
SYR CONTROL 10ML LL (SYRINGE) ×3 IMPLANT
TAPE CLOTH SURG 4X10 WHT LF (GAUZE/BANDAGES/DRESSINGS) ×3 IMPLANT
TOWEL GREEN STERILE (TOWEL DISPOSABLE) ×3 IMPLANT
TOWEL GREEN STERILE FF (TOWEL DISPOSABLE) ×3 IMPLANT
VALVE REPLACEMENT CAP (MISCELLANEOUS) IMPLANT
WATER STERILE IRR 1000ML POUR (IV SOLUTION) ×3 IMPLANT

## 2018-01-01 NOTE — Op Note (Signed)
NAME: Allison [name], Allison [name] MEDICAL RECORD [id number] ACCOUNT [id number] DATE OF BIRTH:10-07-1950 FACILITY: [facility] LOCATION: [facility] PHYSICIAN:PETER VAN TRIGT III, MD  OPERATIVE REPORT  DATE OF PROCEDURE:  01/01/2018  OPERATION:  Placement of right PleurX catheter.  SURGEON:  Tharon Aquas Trigt, III, MD  PREOPERATIVE DIAGNOSIS:  Recurrent malignant right pleural effusion with history of adenocarcinoma of the right upper lobe.  POSTOPERATIVE DIAGNOSIS:  Recurrent malignant right pleural effusion with history of adenocarcinoma of the right upper lobe.  ANESTHESIA:  Local with IV monitored conscious sedation.  DESCRIPTION OF PROCEDURE:  After the procedure of the PleurX catheter insertion had been discussed in detail with the patient and family including the expected benefits, alternatives and potential risks of bleeding, pneumothorax and infection, the  patient was brought back to the operating room and placed supine on the operating room table.  The patient was provided conscious sedation, monitored by the anesthesia team.  The right chest, which had been previously marked as the proper site was then  prepped and draped as a sterile field.  A proper time-out was performed.  1% lidocaine was infiltrated in the anterior axillary line at the fifth interspace and at the midclavicular line at the right costal margin.  Two small incisions were made at these  2 areas.  Through the upper incision using the Seldinger technique, a guidewire was passed into the pleural space and confirmed by C-arm fluoroscopy.  Over the guidewire, a dilator, then dilator sheath was inserted.  Through the dilator sheath, the PleurX catheter  was fed into the posterior aspect of the pleural space after it had been tunneled from the incision at the costal margin.  The position of the catheter was confirmed by C-arm fluoroscopy.  Next, the catheter was used to drain the pleural effusion 1.2 liters of clear xanthochromic  fluid.  The incisions were closed in a standard fashion.  A sterile dressing was applied after the catheter was capped.    The patient returned to recovery room where a chest x-ray showed good catheter position with drainage of the pleural effusion.  AN/NUANCE  D:01/01/2018 T:01/01/2018 JOB:[id number]

## 2018-01-01 NOTE — Brief Op Note (Signed)
01/01/2018  2:25 PM  PATIENT:  Allison [name]  67 y.o. female  PRE-OPERATIVE DIAGNOSIS:  RIGHT MALIGNANT EFFUSION  POST-OPERATIVE DIAGNOSIS:  RIGHT MALIGNANT EFFUSION  PROCEDURE:  Procedure(s): INSERTION PLEURAL DRAINAGE CATHETER (Right) Drainage 1.2 Liters R pleural fluid  SURGEON:  Surgeon(s) and Role:    Ivin Poot, MD - Primary  PHYSICIAN ASSISTANT:   ASSISTANTS: none   ANESTHESIA:   local and MAC  EBL:  3 cc  BLOOD ADMINISTERED:none  DRAINS: none   LOCAL MEDICATIONS USED:  LIDOCAINE 10 cc  SPECIMEN:  No Specimen  DISPOSITION OF SPECIMEN:  N/A  COUNTS:  YES  TOURNIQUET:  * No tourniquets in log *  DICTATION: .Dragon Dictation  PLAN OF CARE: Discharge to home after PACU  PATIENT DISPOSITION:  PACU - hemodynamically stable.   Delay start of Pharmacological VTE agent (>24hrs) due to surgical blood loss or risk of bleeding: yes

## 2018-01-01 NOTE — Transfer of Care (Signed)
Immediate Anesthesia Transfer of Care Note  Patient: Allison [name]  Procedure(s) Performed: INSERTION PLEURAL DRAINAGE CATHETER (Right Chest)  Patient Location: PACU  Anesthesia Type:MAC  Level of Consciousness: awake and patient cooperative  Airway & Oxygen Therapy: Patient Spontanous Breathing and Patient connected to nasal cannula oxygen  Post-op Assessment: Report given to RN, Post -op Vital signs reviewed and stable and Patient moving all extremities X 4  Post vital signs: Reviewed and stable  Last Vitals:  Vitals Value Taken Time  BP 115/87 01/01/2018  2:27 PM  Temp    Pulse 73 01/01/2018  2:28 PM  Resp 20 01/01/2018  2:29 PM  SpO2 100 % 01/01/2018  2:28 PM  Vitals shown include unvalidated device data.  Last Pain:  Vitals:   01/01/18 1311  TempSrc:   PainSc: 0-No pain      Patients Stated Pain Goal: 3 (15/05/69 7948)  Complications: No apparent anesthesia complications

## 2018-01-01 NOTE — Discharge Instructions (Signed)
No shower for 24 hours No driving for 12 hours Office will call for followup appt  Home RN will call to arrange catheter drainage on Mon-Wed- Fri schedule Take tylenol, aleve for pain

## 2018-01-01 NOTE — Progress Notes (Signed)
Pre Procedure note for inpatients:   Allison [name] has been scheduled for Procedure(s): INSERTION PLEURAL DRAINAGE CATHETER (Right) today. The various methods of treatment have been discussed with the patient. After consideration of the risks, benefits and treatment options the patient has consented to the planned procedure.   The patient has been seen and labs reviewed. There are no changes in the patient's condition to prevent proceeding with the planned procedure today.  Recent labs:  Lab Results  Component Value Date   WBC 6.3 10/07/2014   HGB 12.6 10/07/2014   HCT 40.1 10/07/2014    Len Childs, MD 01/01/2018 1:26 PM

## 2018-01-01 NOTE — Anesthesia Preprocedure Evaluation (Addendum)
Anesthesia Evaluation  Patient identified by MRN, date of birth, ID band Patient awake    Reviewed: Allergy & Precautions, NPO status , Patient's Chart, lab work & pertinent test results  Airway Mallampati: II  TM Distance: >3 FB Neck ROM: Full    Dental  (+) Teeth Intact   Pulmonary    breath sounds clear to auscultation + decreased breath sounds      Cardiovascular hypertension,  Rhythm:Regular Rate:Normal     Neuro/Psych    GI/Hepatic   Endo/Other    Renal/GU      Musculoskeletal   Abdominal   Peds  Hematology   Anesthesia Other Findings   Reproductive/Obstetrics                            Anesthesia Physical Anesthesia Plan  ASA: III  Anesthesia Plan: MAC   Post-op Pain Management:    Induction:   PONV Risk Score and Plan: Ondansetron  Airway Management Planned: Natural Airway and Simple Face Mask  Additional Equipment:   Intra-op Plan:   Post-operative Plan:   Informed Consent: I have reviewed the patients History and Physical, chart, labs and discussed the procedure including the risks, benefits and alternatives for the proposed anesthesia with the patient or authorized representative who has indicated his/her understanding and acceptance.     Plan Discussed with: CRNA and Anesthesiologist  Anesthesia Plan Comments:         Anesthesia Quick Evaluation

## 2018-01-01 NOTE — Op Note (Signed)
NAME: Allison [name], Allison [name] MEDICAL RECORD [id number] ACCOUNT [id number] DATE OF BIRTH:21-Feb-1951 FACILITY: [facility] LOCATION: [facility] PHYSICIAN:Keyshon Stein VAN TRIGT III, MD  OPERATIVE REPORT  DATE OF PROCEDURE:  01/01/2018  OPERATIVE NOTE: Cancel.  AN/NUANCE  D:01/01/2018 T:01/01/2018 JOB:[id number]

## 2018-01-01 NOTE — H&P (Signed)
PCP is [name], MD Referring Provider is No ref. provider found  No chief complaint on file.  Patient examined, chest x-ray and CT scan of chest performed last month personally reviewed and counseled with patient  HPI: Very nice 67 year old female never smoker presents for evaluation of recently diagnosed right pleural effusion and 2 cm right upper lobe mass.  Patient began having pulmonary symptoms of cough, congestion, shortness of breath and fatigue associated with 5 to 10 pound weight loss.  She was treated with a course of oral antibiotic [moxifloxacin] without improvement.  Follow-up CT scan showed a moderate right pleural effusion with evidence of loculation at the base with a visible 2.5 cm right upper lobe irregular mass.  Because of concern over neoplasm the patient was referred to thoracic surgery for biopsy-tissue diagnosis.  The patient denies bone pain.  There is no family history of lung cancer.  No history of dental disease or aspiration.  No history of thoracic trauma.  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  THORACENTESIS- malig cellc/w adenoca of Lung  Right Pleurx cather today for persistent R malignant effusion  Past Medical History:  Diagnosis Date  . Anxiety   . Arthritis   . Asthma    exercise induced  . Depression    PMH  . GERD (gastroesophageal reflux disease)   . Glaucoma   . Hypertension   . Malignant pleural effusion    right  . PONV (postoperative nausea and vomiting)   . Pre-diabetes   . Raynaud's disease   . Raynaud's disease     Past Surgical History:  Procedure Laterality Date  . ABDOMINAL HYSTERECTOMY     partial  . COLONOSCOPY    . DILATION AND CURETTAGE OF UTERUS    . EYE SURGERY     due to Glaucoma  . ROTATOR CUFF REPAIR    . TUBAL LIGATION    . WISDOM TOOTH EXTRACTION      Family History  Problem Relation Age of Onset  . Heart disease Sister   . Heart disease Brother   . Lung  cancer Other     Social History Social History   Tobacco Use  . Smoking status: Never Smoker  . Smokeless tobacco: Never Used  Substance Use Topics  . Alcohol use: Yes    Comment: up to 3 drinks per week  . Drug use: No    Comment: CBD oil     Current Facility-Administered Medications  Medication Dose Route Frequency Provider Last Rate Last Dose  . lidocaine (XYLOCAINE) 1 % (with pres) injection    PRN Prescott Gum, Collier Salina, MD   15 mL at 01/01/18 1318  . vancomycin (VANCOCIN) 1-5 GM/200ML-% IVPB           . [START ON 01/02/2018] vancomycin (VANCOCIN) IVPB 1000 mg/200 mL premix  1,000 mg Intravenous On Call to OR Ivin Poot, MD        Allergies  Allergen Reactions  . Penicillins Other (See Comments)    SYNCOPE PATIENT HAS HAD A PCN REACTION WITH IMMEDIATE RASH, FACIAL/TONGUE/THROAT SWELLING, SOB, OR LIGHTHEADEDNESS WITH HYPOTENSION:  #  #  YES  #  # Has patient had a PCN reaction causing severe rash involving mucus membranes or skin necrosis: No Has patient had a PCN reaction that required hospitalization: No Has patient had a PCN reaction occurring within the last 10 years: No   . Vicodin [Hydrocodone-Acetaminophen] Other (See Comments)  Sped up heart and breathing    Review of Systems                    Review of Systems :  [ y ] = yes, [  ] = no        General :  Weight gain [   ]    Weight loss  Blue.Reese   ]  Fatigue Blue.Reese  ]  Fever [  ]  Chills  [  ]                                          HEENT    Headache [  ]  Dizziness [  ]  Blurred vision [  ] Glaucoma  [  ]                          Nosebleeds [  ] Painful or loose teeth [  ]        Cardiac :  Chest pain/ pressure Blue.Reese  ]  Resting SOB [  ] exertional SOB Blue.Reese  ]                        [name] [  ]  Pedal edema  [  ]  Palpitations [  ] Syncope/presyncope [ ]                         Paroxysmal nocturnal dyspnea [  ]         Pulmonary : cough [  y]  wheezing [  ]  Hemoptysis [  ] Sputum Blue.Reese ] Snoring [  ]                               Pneumothorax [  ]  Sleep apnea [  ]        GI : Vomiting [  ]  Dysphagia [  ]  Melena  [  ]  Abdominal pain [  ] BRBPR [  ]              Heart burn [  ]  Constipation [  ] Diarrhea  [  ] Colonoscopy [   ]        GU : Hematuria [  ]  Dysuria [  ]  Nocturia [  ] UTI's [  ]        Vascular : Claudication [  ]  Rest pain [  ]  DVT [  ] Vein stripping [  ] leg ulcers [  ]                          TIA [  ] Stroke [  ]  Varicose veins [  ]        NEURO :  Headaches  [  ] Seizures [  ] Vision changes [  ] Paresthesias [  ]            right-hand-dominant                                   Musculoskeletal :  Arthritis [ y ] Gout  [  ]  Back pain [  ]  Joint pain [  ]        Skin :  Rash [  ]  Melanoma [  ] Sores [  ]        Heme : Bleeding problems [  ]Clotting Disorders [  ] Anemia [  ]Blood Transfusion [ ]         Endocrine : Diabetes [  ] Heat or Cold intolerance [  ] Polyuria [  ]excessive thirst [ ]         Psych : Depression [  ]  Anxiety [  ]  Psych hospitalizations [  ] Memory change [  ]                                                                            BP (!) 175/72   Pulse (!) 58   Temp 98.4 F (36.9 C) (Oral)   Resp 18   Ht 5\' 4"  (1.626 m)   Wt 126 lb 12.8 oz (57.5 kg)   SpO2 98%   BMI 21.77 kg/m  Physical Exam     Physical Exam  General: Very nice middle-aged female no acute distress HEENT: Normocephalic pupils equal , dentition adequate Neck: Supple without JVD, adenopathy, or bruit Chest: Diminished breath sounds at right base, clear on left,, no rhonchi, no tenderness             or deformity Cardiovascular: Regular rate and rhythm, no murmur, no gallop, peripheral pulses             palpable in all extremities Abdomen:  Soft, nontender, no palpable mass or organomegaly Extremities: Warm, well-perfused, no clubbing cyanosis edema or tenderness,              no venous stasis changes of the legs Rectal/GU: Deferred Neuro: Grossly  non--focal and symmetrical throughout Skin: Clean and dry without rash or ulceration   Diagnostic Tests: CT scan of chest reviewed showing a basilar probably loculated pleural effusion with a visible 2.5 cm irregular mass in the right upper lobe.  Some surrounding smaller nodules, could be satellite nodules.  No significant mediastinal adenopathy.  Impression: Right pleural effusion with right upper lobe mass.  Suspicion for neoplastic process.  Patient will be scheduled for a PET scan and will have the right pleural effusion drained and sent for cytology and cultures.  She will return after the above events.    Plan:She understands the benefits and risks of R Pleurx catheter  Len Childs, MD [facility] [phone]

## 2018-01-01 NOTE — Anesthesia Procedure Notes (Signed)
Procedure Name: MAC Date/Time: 01/01/2018 1:50 PM Performed by: Orlie Dakin, CRNA Pre-anesthesia Checklist: Patient identified, Emergency Drugs available, Suction available, Patient being monitored and Timeout performed Patient Re-evaluated:Patient Re-evaluated prior to induction Oxygen Delivery Method: Nasal cannula Preoxygenation: Pre-oxygenation with 100% oxygen Induction Type: IV induction

## 2018-01-01 NOTE — Anesthesia Postprocedure Evaluation (Signed)
Anesthesia Post Note  Patient: Allison [name]  Procedure(s) Performed: INSERTION PLEURAL DRAINAGE CATHETER (Right Chest)     Patient location during evaluation: PACU Anesthesia Type: MAC Level of consciousness: awake and alert Pain management: pain level controlled Vital Signs Assessment: post-procedure vital signs reviewed and stable Respiratory status: spontaneous breathing, nonlabored ventilation, respiratory function stable and patient connected to nasal cannula oxygen Cardiovascular status: stable and blood pressure returned to baseline Postop Assessment: no apparent nausea or vomiting Anesthetic complications: no    Last Vitals:  Vitals:   01/01/18 1557 01/01/18 1600  BP: 132/77   Pulse:    Resp: 15   Temp:  (!) 36.3 C  SpO2:      Last Pain:  Vitals:   01/01/18 1500  TempSrc:   PainSc: 7                  Elmyra Banwart COKER

## 2018-01-02 ENCOUNTER — Telehealth: Payer: Self-pay

## 2018-01-02 ENCOUNTER — Telehealth: Payer: Self-pay | Admitting: Surgery

## 2018-01-02 ENCOUNTER — Encounter (HOSPITAL_COMMUNITY): Payer: Self-pay | Admitting: Cardiothoracic Surgery

## 2018-01-02 NOTE — Care Management Note (Signed)
Case Management Note  Patient Details  Name: Allison [name] MRN: [id number] Date of Birth: October 08, 1950  CM contacted by Butch Penny with [facility] who advised they could not accept pt for services at they do not have the staff for the area the pt resides.  CM contacted Dian Situ with [facility] who advised they are not in net-work with the pt's insurance provider.  CM contacted Janett Billow with [facility] who accepted pt for services.  No further CM needs noted at this time.  Expected Discharge Date:   01/01/2018               Expected Discharge Plan:  [place]  Discharge planning Services  CM Consult  Post Acute Care Choice:  Home Health Choice offered to:  Patient  HH Arranged:  RN [facility] Agency:  [facility]  Status of Service:  Completed, signed off  Sharnay Cashion, Benjaman Lobe, RN 01/02/2018, 10:36 AM

## 2018-01-02 NOTE — Care Management (Addendum)
ED CM spoke with patient to arrange [facility] services for Pleurx Cath Management. Patient states that she has contacted [place]  [employer] to inquire about [facility] agencies within network, and was told that [facility] was within network.   CM explained that the order was for start of care 7/18  every other day. Patient states that she would not be able to have services until Friday because she had a doctors appointment. ED CM faxed orders in to [facility] explained that nurse would contact patient to arrange the visit 24- 48 hours post discha. [facility]  Pleurx Catheter kits form completed and faxed to [facility] [phone].  CM made patient aware patient verbalized understanding and teach back done. No additional CM concerns at this time.

## 2018-01-02 NOTE — Telephone Encounter (Signed)
ED CM received call from patient stating she has not heard from [facility] as of yet to open case, and has concerns Pleurx Catheter leaking and dsg.  CM ask if she wanted to contact them or contact another [place], patient states she will contact [facility], CM offered to contact Malachy Mood the Liaison patient is agreeable to accept referral. CM made patient aware and referral sent to [facility]. RN will reach out to patient this evening. Patient is agreeable with this care transitional plan. No further ED CM needs identified

## 2018-01-02 NOTE — Telephone Encounter (Signed)
Allison [name] called concerned because her PleurX catheter dressing came loose when she showered this morning.  She had the catheter placed yesterday with Dr. Prescott Gum and has not seen home health with instructions as to how to reapply the dressing.  Directions were given over the phone and she stated that she understood now how to replace it.  I advised her that the Tegaderm has to have a tight seal when applied in order for no water to enter the dressing when showering.  She acknowledged receipt and stated that she had another dressing kit and would change the dressing.

## 2018-01-03 ENCOUNTER — Other Ambulatory Visit: Payer: Self-pay

## 2018-01-03 ENCOUNTER — Inpatient Hospital Stay (HOSPITAL_BASED_OUTPATIENT_CLINIC_OR_DEPARTMENT_OTHER): Payer: Medicare HMO | Admitting: Internal Medicine

## 2018-01-03 ENCOUNTER — Other Ambulatory Visit: Payer: Self-pay | Admitting: *Deleted

## 2018-01-03 ENCOUNTER — Encounter: Payer: Self-pay | Admitting: Internal Medicine

## 2018-01-03 ENCOUNTER — Telehealth: Payer: Self-pay | Admitting: Internal Medicine

## 2018-01-03 ENCOUNTER — Other Ambulatory Visit (HOSPITAL_COMMUNITY)
Admission: RE | Admit: 2018-01-03 | Discharge: 2018-01-03 | Disposition: A | Discharge status: Home / Self Care | Payer: Medicare HMO | Source: Ambulatory Visit | Attending: Obstetrics and Gynecology | Admitting: Obstetrics and Gynecology

## 2018-01-03 ENCOUNTER — Ambulatory Visit: Payer: Medicare HMO | Admitting: *Deleted

## 2018-01-03 ENCOUNTER — Encounter: Payer: Self-pay | Admitting: *Deleted

## 2018-01-03 ENCOUNTER — Telehealth: Payer: Self-pay | Admitting: *Deleted

## 2018-01-03 ENCOUNTER — Inpatient Hospital Stay: Payer: Medicare HMO | Attending: Internal Medicine

## 2018-01-03 ENCOUNTER — Inpatient Hospital Stay: Payer: Medicare HMO

## 2018-01-03 VITALS — BP 176/76 | HR 52 | Temp 98.1°F | Resp 18 | Ht 64.5 in | Wt 128.4 lb

## 2018-01-03 DIAGNOSIS — J910 Malignant pleural effusion: Secondary | ICD-10-CM

## 2018-01-03 DIAGNOSIS — C3411 Malignant neoplasm of upper lobe, right bronchus or lung: Secondary | ICD-10-CM | POA: Diagnosis not present

## 2018-01-03 DIAGNOSIS — R091 Pleurisy: Secondary | ICD-10-CM | POA: Diagnosis not present

## 2018-01-03 DIAGNOSIS — R918 Other nonspecific abnormal finding of lung field: Secondary | ICD-10-CM

## 2018-01-03 DIAGNOSIS — J90 Pleural effusion, not elsewhere classified: Secondary | ICD-10-CM

## 2018-01-03 DIAGNOSIS — E46 Unspecified protein-calorie malnutrition: Secondary | ICD-10-CM | POA: Diagnosis not present

## 2018-01-03 DIAGNOSIS — C3490 Malignant neoplasm of unspecified part of unspecified bronchus or lung: Secondary | ICD-10-CM

## 2018-01-03 DIAGNOSIS — C3491 Malignant neoplasm of unspecified part of right bronchus or lung: Secondary | ICD-10-CM

## 2018-01-03 DIAGNOSIS — C7800 Secondary malignant neoplasm of unspecified lung: Secondary | ICD-10-CM | POA: Diagnosis not present

## 2018-01-03 DIAGNOSIS — Z7189 Other specified counseling: Secondary | ICD-10-CM

## 2018-01-03 DIAGNOSIS — J918 Pleural effusion in other conditions classified elsewhere: Secondary | ICD-10-CM | POA: Diagnosis not present

## 2018-01-03 DIAGNOSIS — C50919 Malignant neoplasm of unspecified site of unspecified female breast: Secondary | ICD-10-CM | POA: Diagnosis not present

## 2018-01-03 LAB — CBC WITH DIFFERENTIAL (CANCER CENTER ONLY)
Basophils Absolute: 0.1 10*3/uL (ref 0.0–0.1)
Basophils Relative: 1 %
Eosinophils Absolute: 0.1 10*3/uL (ref 0.0–0.5)
Eosinophils Relative: 1 %
HCT: 37.6 % (ref 34.8–46.6)
Hemoglobin: 12.1 g/dL (ref 11.6–15.9)
Lymphocytes Relative: 31 %
Lymphs Abs: 2.8 10*3/uL (ref 0.9–3.3)
MCH: 27 pg (ref 25.1–34.0)
MCHC: 32.2 g/dL (ref 31.5–36.0)
MCV: 83.9 fL (ref 79.5–101.0)
Monocytes Absolute: 0.6 10*3/uL (ref 0.1–0.9)
Monocytes Relative: 6 %
Neutro Abs: 5.5 10*3/uL (ref 1.5–6.5)
Neutrophils Relative %: 61 %
Platelet Count: 296 10*3/uL (ref 145–400)
RBC: 4.48 MIL/uL (ref 3.70–5.45)
RDW: 13.8 % (ref 11.2–14.5)
WBC Count: 9 10*3/uL (ref 3.9–10.3)

## 2018-01-03 LAB — CMP (CANCER CENTER ONLY)
ALT: 19 U/L (ref 0–44)
AST: 21 U/L (ref 15–41)
Albumin: 4 g/dL (ref 3.5–5.0)
Alkaline Phosphatase: 55 U/L (ref 38–126)
Anion gap: 8 (ref 5–15)
BUN: 17 mg/dL (ref 8–23)
CO2: 29 mmol/L (ref 22–32)
Calcium: 9.6 mg/dL (ref 8.9–10.3)
Chloride: 102 mmol/L (ref 98–111)
Creatinine: 0.86 mg/dL (ref 0.44–1.00)
GFR, Est AFR Am: >60 mL/min (ref >60)
GFR, Estimated: >60 mL/min (ref >60)
Glucose, Bld: 84 mg/dL (ref 70–99)
Potassium: 3.9 mmol/L (ref 3.5–5.1)
Sodium: 139 mmol/L (ref 135–145)
Total Bilirubin: 0.2 mg/dL — ABNORMAL LOW (ref 0.3–1.2)
Total Protein: 7.1 g/dL (ref 6.5–8.1)

## 2018-01-03 NOTE — Progress Notes (Signed)
[place] Telephone:(336) [phone]   Fax:([phone] Multidisciplinary thoracic oncology clinic  CONSULT NOTE  REFERRING PHYSICIAN: Dr. Tharon Aquas Trigt  REASON FOR CONSULTATION:  67 years old African-American female recently diagnosed with lung cancer.  HPI [name] is a 67 y.o. female and never smoker with past medical history significant for hypertension, asthma, anxiety, osteoarthritis, depression, GERD, Raynaud's disease.  She was seen by her primary care physician complaining of dry cough and shortness of breath for 3 weeks.  Chest x-ray was performed on 12/04/2017 and that showed large partially loculated right pleural effusion with scattered atelectasis in the right lung.  Underlying abnormalities in the right upper and right lower lobes including infiltrate and mass were not excluded.  This was followed by CT scan of the chest on December 11, 2017 and that showed a large right pleural effusion with volume loss on the right.  Within the right upper lobe there was a solid-appearing spiculated mass measuring 2.3 x 2.9 cm suspicious for primary lung carcinoma.  There are also several small nodules throughout the right lung and left lung suspicious for metastasis.  Some of the nodules in the right are intimately associated with the pleura and could represent pleural metastasis.  There was also questionable soft tissue nodule in the left upper abdomen which could represent a metastatic lesion.  There was mixed lesion within the T9 vertebral body and metastasis could not be excluded.  The patient was seen by Dr. Darcey Nora and on 12/19/2017 she underwent ultrasound-guided right-sided thoracentesis with drainage of 1.4 L of pleural fluid. The final cytology ([id number]) showed malignant cells consistent with metastatic adenocarcinoma of lung primary.  The carcinoma is positive by immunohistochemistry with TTF-1, Napsin-A and cytokeratin 7.  The tumor was negative for CDX 2,  cytokeratin 5/6, cytokeratin 20, ER, PR, GATA3 and GCDFP as well as WT-1.  The tissue block was sent to foundation 1 for molecular studies and PDL 1 expression.  There was insufficient material for the molecular studies but PDL 1 expression was 5%.  A PET scan on 12/26/2017 showed hypermetabolic spiculated 3.3 x 2.6 cm apical right upper lobe lung mass with SUV max of 10.1.  There was numerous scattered solid pulmonary nodules in both lungs more than 10 several of which are pleural-based in the right lung the largest was hypermetabolic and measured 1.6 cm in the anterior right lower lobe associated with the major fissure with maximum SUV of 3.3.  The remaining nodules are subcentimeter and below the PET resolution.  There was moderate dependent right pleural effusion with mild small right pleural thickening and no left pleural effusion.  There was also mildly hypermetabolic right hilar node with SUV max of 3.3 and mildly hypermetabolic right paratracheal 1.1 cm node with maximum SUV of 4.8.  The scan showed no extrathoracic or osseous metastatic disease.  On 01/01/2018 the patient underwent right Pleurx catheter placement by Dr. Prescott Gum. Dr. Prescott Gum kindly referred the patient to me today for evaluation and recommendation regarding treatment of her condition. When seen today the patient is very anxious.  She lost around 30 pounds in the last few weeks.  She was very active and exercise at regular basis.  She also complaining of night sweats.  She has no chest pain but continues to have mild cough with no hemoptysis.  She has no nausea, vomiting, diarrhea or constipation.  She denied having any headache or visual changes. Family history significant for brother and  sister died from heart disease, mother died from eclampsia.  Father medical history is unknown. The patient is single and has 3 daughters.  She is currently retired and used to work as a Freight forwarder at [employer].  She was accompanied by her  significant other [name].  She has no smoking history.  She drinks alcohol around 3 times a week.  She has no history of drug abuse except for CBD oil.   HPI  Past Medical History:  Diagnosis Date  . Anxiety   . Arthritis   . Asthma    exercise induced  . Depression    PMH  . GERD (gastroesophageal reflux disease)   . Glaucoma   . Hypertension   . Malignant pleural effusion    right  . PONV (postoperative nausea and vomiting)   . Pre-diabetes   . Raynaud's disease   . Raynaud's disease     Past Surgical History:  Procedure Laterality Date  . ABDOMINAL HYSTERECTOMY     partial  . CHEST TUBE INSERTION Right 01/01/2018   Procedure: INSERTION PLEURAL DRAINAGE CATHETER;  Surgeon: Ivin Poot, MD;  Location: [facility];  Service: Thoracic;  Laterality: Right;  . COLONOSCOPY    . DILATION AND CURETTAGE OF UTERUS    . EYE SURGERY     due to Glaucoma  . ROTATOR CUFF REPAIR    . TUBAL LIGATION    . WISDOM TOOTH EXTRACTION      Family History  Problem Relation Age of Onset  . Heart disease Sister   . Heart disease Brother   . Lung cancer Other     Social History Social History   Tobacco Use  . Smoking status: Never Smoker  . Smokeless tobacco: Never Used  Substance Use Topics  . Alcohol use: Yes    Comment: up to 3 drinks per week  . Drug use: No    Comment: CBD oil     Allergies  Allergen Reactions  . Penicillins Other (See Comments)    [facility] PATIENT HAS HAD A PCN REACTION WITH IMMEDIATE RASH, FACIAL/TONGUE/THROAT SWELLING, SOB, OR LIGHTHEADEDNESS WITH HYPOTENSION:  #  #  YES  #  # Has patient had a PCN reaction causing severe rash involving mucus membranes or skin necrosis: No Has patient had a PCN reaction that required hospitalization: No Has patient had a PCN reaction occurring within the last 10 years: No   . Vicodin [Hydrocodone-Acetaminophen] Other (See Comments)    Sped up heart and breathing    Current Outpatient Medications  Medication Sig  Dispense Refill  . albuterol (PROVENTIL HFA;VENTOLIN HFA) 108 (90 Base) MCG/ACT inhaler TAKE 2 PUFFS BY MOUTH EVERY 4 HOURS AS NEEDED wheezing/shortness of breath  3  . ALPRAZolam (XANAX) 0.25 MG tablet Take 0.52m by mouth once daily as needed for anxiety    . amitriptyline (ELAVIL) 10 MG tablet Take 10 mg by mouth at bedtime.    .Marland KitchenamLODipine (NORVASC) 5 MG tablet Take 5 mg by mouth daily.  3  . B Complex-Biotin-FA (B COMPLETE) TABS Take 1 tablet by mouth.    . brimonidine (ALPHAGAN P) 0.1 % SOLN Instill 1 drop in both eye twice daily    . clindamycin (CLEOCIN T) 1 % SWAB Apply 1 application topically 2 (two) times daily as needed (rash).     . cromolyn (OPTICROM) 4 % ophthalmic solution Place 1 drop into both eyes 4 (four) times daily as needed (irritation).     . dorzolamide-timolol (COSOPT)  22.3-6.8 MG/ML ophthalmic solution Instill 1 drop in both eyes twice daily    . Estradiol-Estriol-Progesterone (BIEST/PROGESTERONE) CREA Place 1 application onto the skin 3 (three) times a week.    . estrogens, conjugated, (PREMARIN) 0.625 MG tablet 0.625 mg daily. Take daily for 21 days then do not take for 7 days.     . fenofibrate 160 MG tablet fenofibrate 160 mg tablet  TAKE 1 TABLET BY MOUTH EVERY DAY    . hydrochlorothiazide (HYDRODIURIL) 25 MG tablet Take 25 mg by mouth daily.  4  . latanoprost (XALATAN) 0.005 % ophthalmic solution Place 1 drop into both eyes at bedtime.     Marland Kitchen OVER THE COUNTER MEDICATION Take 1 tablet by mouth daily.     Marland Kitchen triamcinolone cream (KENALOG) 0.1 % Apply 1 application topically 2 (two) times daily. (Patient taking differently: Apply 1 application topically 2 (two) times daily as needed (rash). ) 30 g 0  . Turmeric 500 MG TABS Take 1,000 mg by mouth daily.     No current facility-administered medications for this visit.     Review of Systems  Constitutional: positive for fatigue, night sweats and weight loss Eyes: negative Ears, nose, mouth, throat, and face:  negative Respiratory: positive for cough and dyspnea on exertion Cardiovascular: negative Gastrointestinal: negative Genitourinary:negative Integument/breast: negative Hematologic/lymphatic: negative Musculoskeletal:negative Neurological: negative Behavioral/Psych: positive for anxiety Endocrine: negative Allergic/Immunologic: negative  Physical Exam  YYT:KPTWS, healthy, no distress, well nourished, well developed and anxious SKIN: skin color, texture, turgor are normal, no rashes or significant lesions HEAD: Normocephalic, No masses, lesions, tenderness or abnormalities EYES: normal, PERRLA, Conjunctiva are pink and non-injected EARS: External ears normal, Canals clear OROPHARYNX:no exudate, no erythema and lips, buccal mucosa, and tongue normal  NECK: supple, no adenopathy, no JVD LYMPH:  no palpable lymphadenopathy, no hepatosplenomegaly BREAST:not examined LUNGS: decreased breath sounds HEART: regular rate & rhythm, no murmurs and no gallops ABDOMEN:abdomen soft, non-tender, normal bowel sounds and no masses or organomegaly BACK: Back symmetric, no curvature., No CVA tenderness EXTREMITIES:no joint deformities, effusion, or inflammation, no edema  NEURO: alert & oriented x 3 with fluent speech, no focal motor/sensory deficits  PERFORMANCE STATUS: ECOG 1  LABORATORY DATA: Lab Results  Component Value Date   WBC 5.1 01/01/2018   HGB 13.1 01/01/2018   HCT 41.4 01/01/2018   MCV 85.5 01/01/2018   PLT 334 01/01/2018      Chemistry      Component Value Date/Time   NA 140 01/01/2018 1313   K 3.6 01/01/2018 1313   CL 106 01/01/2018 1313   CO2 24 01/01/2018 1313   BUN 15 01/01/2018 1313   CREATININE 0.91 01/01/2018 1313      Component Value Date/Time   CALCIUM 9.6 01/01/2018 1313   ALKPHOS 49 01/01/2018 1313   AST 23 01/01/2018 1313   ALT 19 01/01/2018 1313   BILITOT 0.5 01/01/2018 1313       RADIOGRAPHIC STUDIES: Dg Chest 1 View  Result Date:  12/19/2017 CLINICAL DATA:  Large right pleural effusion. Right lung mass. Status post thoracentesis. EXAM: CHEST  1 VIEW COMPARISON:  Chest x-ray dated 12/04/2017 and chest CT dated 12/11/2017 FINDINGS: There has been a marked decrease in right pleural effusion after thoracentesis. No pneumothorax. There is a small to moderate residual right pleural effusion. Poorly defined 2.5 cm mass in the right upper lobe is noted. Left lung is clear. Heart size and vascularity are normal. No bone abnormality. IMPRESSION: 1. No pneumothorax after thoracentesis. 2. Marked reduction  in pleural effusion with a small to moderate residual effusion. 3. Ill-defined mass in the right lung apex worrisome for carcinoma. Electronically Signed   By: Lorriane Shire M.D.   On: 12/19/2017 15:28   Dg Chest 2 View  Result Date: 01/01/2018 CLINICAL DATA:  Preoperative evaluation for placement of a pleural drainage catheter, history of malignant RIGHT pleural effusion, asthma, hypertension EXAM: CHEST - 2 VIEW COMPARISON:  12/20/2014 FINDINGS: Upper normal heart size. Mediastinal contours and pulmonary vascularity normal. Atherosclerotic calcification aortic arch. Moderate RIGHT pleural effusion and basilar atelectasis, slightly increased. Opacities at the RIGHT upper lobe are unchanged, corresponding to tumor seen by prior CT. LEFT lung clear. No pneumothorax or acute osseous findings. IMPRESSION: Known RIGHT upper lobe mass. Increased RIGHT pleural effusion and basilar atelectasis. Electronically Signed   By: Lavonia Dana M.D.   On: 01/01/2018 12:58   Ct Chest W Contrast  Result Date: 12/11/2017 CLINICAL DATA:  History of pneumonia, effusion, and atelectasis, cough, chest tightness and shortness of breath with exertion EXAM: CT CHEST WITH CONTRAST TECHNIQUE: Multidetector CT imaging of the chest was performed during intravenous contrast administration. CONTRAST:  58m ISOVUE-300 IOPAMIDOL (ISOVUE-300) INJECTION 61% COMPARISON:  Chest x-ray  of 12/04/2017. Creatinine was obtained on site at [facility] [address] Results: Creatinine 0.9 mg/dL.  GFR of 77. FINDINGS: Cardiovascular: There is a large right pleural effusion present with resultant volume loss on the right. Within the right upper lobe on image 48 series 2, there is a solid appearing spiculated mass of 2.3 x 2.9 cm. This is suspicious for primary lung carcinoma. Mediastinum/Nodes: Only small mediastinal lymph nodes are present. No definite mediastinal or hilar adenopathy is seen. The thyroid gland is somewhat prominent and there is low-attenuation nodule in the posterior right lobe of 8 mm in diameter. A smaller nodule is noted on the left. Lungs/Pleura: On lung window images, the spiculated mass in the right upper lobe is again identified best seen on image 48 series 5. There is a linear extension of this mass toward the mediastinum medially. And additional 10 mm nodule is present in the right upper lobe laterally on image 62 series 5. And 8 mm nodule abuts the major fissure in the right upper lobe on image 74. A 16 mm nodule abuts the major fissure more caudally on image 96. There is a 4 mm nodule in the left upper lobe on image 71 series 5. A 3 mm nodule is present within the lingula on image 84. All of these nodules are suspicious for diffuse lung metastases. No left pleural effusion is seen. Some nodularity in the right lung may be due to pleural metastases. Upper Abdomen: Within the upper abdomen the only abnormality is a questioned nodule in the left upper abdomen just anterior to the left kidney which could represent a metastatic lesion. Musculoskeletal: The thoracic vertebrae are in normal alignment. There is a mixed lesion within the posterior aspect of T9 vertebral body and a metastatic deposit cannot be excluded. IMPRESSION: 1. Large right pleural effusion with volume loss of much of the right lung. 2. Right upper lobe spiculated mass of 2.3 x 2.9 cm suspicious  for primary lung carcinoma. 3. Several small nodules throughout the right lung and left lung suspicious for metastases. Some of the nodules on the right are intimately associated with pleura and could represent pleural metastases. 4. Questionable soft tissue nodule in the left upper abdomen which could represent a metastatic lesion. 5. Mixed lesion within  the T9 vertebral body. Cannot exclude a metastatic process. Electronically Signed   By: Ivar Drape M.D.   On: 12/11/2017 13:54   Nm Pet Image Initial (pi) Skull Base To Thigh  Result Date: 12/26/2017 CLINICAL DATA:  Initial treatment strategy for newly diagnosed right lung adenocarcinoma with malignant right pleural effusion as proven by thoracentesis. EXAM: NUCLEAR MEDICINE PET SKULL BASE TO THIGH TECHNIQUE: 6.2 mCi F-18 FDG was injected intravenously. Full-ring PET imaging was performed from the skull base to thigh after the radiotracer. CT data was obtained and used for attenuation correction and anatomic localization. Fasting blood glucose: 87 mg/dl COMPARISON:  12/11/2017 chest CT. FINDINGS: Mediastinal blood pool activity: SUV max 2.4 NECK: No hypermetabolic lymph nodes in the neck. Incidental CT findings: none CHEST: Hypermetabolic spiculated 3.3 x 2.6 cm apical right upper lobe lung mass with max SUV 10.1 (series 8/image 18). Numerous scattered solid pulmonary nodules in both lungs (> 10), several of which are pleural-based in the right lung, largest a hypermetabolic 1.6 cm anterior right lower lobe nodule associated with the major fissure with max SUV 3.3 (series 8/image 41). The remaining nodules are subcentimeter and below PET resolution. Moderate dependent right pleural effusion with mild smooth right pleural thickening. No left pleural effusion. Mildly hypermetabolic right hilar node with max SUV 3.4. Mildly hypermetabolic right paratracheal 1.1 cm node with max SUV 4.8 (series 4/image 63). No additional hypermetabolic thoracic nodes. Incidental  CT findings: Atherosclerotic nonaneurysmal thoracic aorta. ABDOMEN/PELVIS: No abnormal hypermetabolic activity within the liver, pancreas, adrenal glands, or spleen. No hypermetabolic lymph nodes in the abdomen or pelvis. Incidental CT findings: Hysterectomy. Trace free fluid in the pelvis, nonspecific. Atherosclerotic nonaneurysmal abdominal aorta. SKELETON: No focal hypermetabolic activity to suggest skeletal metastasis. Mild hypermetabolism at the right greater left sternoclavicular joints correlating with mild degenerative changes, favored to be degenerative in etiology. Incidental CT findings: No hypermetabolism at mixed lytic and sclerotic T9 vertebral lesion described on the recent chest CT, which is compatible with a vertebral hemangioma. IMPRESSION: 1. Hypermetabolic spiculated 3.3 cm apical right upper lobe lung mass, compatible with primary bronchogenic adenocarcinoma. 2. Hypermetabolic right hilar and right paratracheal lymphadenopathy. 3. Moderate dependent right pleural effusion with mild right pleural thickening, compatible with known malignant right pleural effusion. 4. Hypermetabolic 1.6 cm anterior right lower lobe nodule associated with the major fissure, compatible with pulmonary metastasis. Numerous additional subcentimeter pulmonary nodules scattered in both lungs, below PET resolution, suspicious for pulmonary metastases. Recommend attention on follow-up chest CT in 3 months. 5. No extrathoracic or osseous hypermetabolic metastatic disease. 6.  Aortic Atherosclerosis (ICD10-I70.0). Electronically Signed   By: Ilona Sorrel M.D.   On: 12/26/2017 15:17   Dg Chest Port 1 View  Result Date: 01/01/2018 CLINICAL DATA:  Post insertion of pleural drainage catheter on the right EXAM: PORTABLE CHEST 1 VIEW COMPARISON:  Chest x-ray of 01/01/2018 FINDINGS: A pleural drainage catheter has been inserted with the tip at the right lung apex. Medial right apical lung mass is again noted. No pneumothorax is  seen. The amount of fluid has diminished somewhat in the interval. The left lung is clear. Heart size is stable. IMPRESSION: 1. New pleural catheter inserted with the tip near the apex of the right lung. 2. Decrease in volume of right pleural effusion. 3. No change in medial right upper lobe lung mass. Electronically Signed   By: Ivar Drape M.D.   On: 01/01/2018 16:03   Dg Fluoro Guide Cv Line-no Report  Result Date: 01/01/2018 Fluoroscopy  was utilized by the requesting physician.  No radiographic interpretation.   US Thoracentesis Asp Pleural Space W/img Guide  Result Date: 12/19/2017 INDICATION: Patient with history of cough, dyspnea, dominant right upper lobe lung mass with scattered bilateral lung nodules, right pleural effusion; request made for diagnostic and therapeutic right thoracentesis. EXAM: ULTRASOUND GUIDED DIAGNOSTIC AND THERAPEUTIC RIGHT THORACENTESIS MEDICATIONS: None COMPLICATIONS: None immediate. PROCEDURE: An ultrasound guided thoracentesis was thoroughly discussed with the patient and questions answered. The benefits, risks, alternatives and complications were also discussed. The patient understands and wishes to proceed with the procedure. Written consent was obtained. Ultrasound was performed to localize and mark an adequate pocket of fluid in the right chest. The area was then prepped and draped in the normal sterile fashion. 1% Lidocaine was used for local anesthesia. Under ultrasound guidance a 6 Fr Safe-T-Centesis catheter was introduced. Thoracentesis was performed. The catheter was removed and a dressing applied. FINDINGS: A total of approximately 1.4 liters of yellow fluid was removed. Samples were sent to the laboratory as requested by the clinical team. IMPRESSION: Successful ultrasound guided diagnostic and therapeutic right thoracentesis yielding 1.4 liters of pleural fluid. Read by: Rowe Robert, PA-C Electronically Signed   By: Aletta Edouard M.D.   On: 12/19/2017 15:19      ASSESSMENT: This is a very pleasant 66 years old never smoker African-American female recently diagnosed with stage IV (T2 a,N2, M1a) non-small cell lung cancer, adenocarcinoma diagnosed in July 2019 and presented with right upper lobe lung mass in addition to mediastinal lymphadenopathy as well as bilateral pulmonary nodules and malignant right pleural effusion.   PLAN: I had a lengthy discussion with the patient and her boyfriend today about her current disease stage, prognosis and treatment options. I recommended for the patient to complete the staging work-up by ordering an MRI of the brain to rule out brain metastasis. I explained to the patient that she has incurable condition and all the treatment will be of palliative nature. I recommended for the patient to have another tissue block from the pleural effusion to foundation 1 for molecular studies.  The patient is a never smoker and there is a high probability that she may have molecular mutation that could be used for treatment with targeted therapy. I also discussed with the patient other option for treatment of her condition including palliative care and hospice referral versus consideration of combination of systemic chemotherapy and immunotherapy if she has no molecular mutations. I will arrange for the patient to have a blood test for EGFR mutation performed today. I will see her back for follow-up visit in 2-3 weeks for reevaluation and more detailed discussion of her treatment options based on the molecular studies. For the recurrent right pleural effusion, she will continue the drainage of the pleural fluid via the Pleurx catheter under the care of Dr. Prescott Gum. For the weight loss and malnutrition, I will refer the patient to the dietitian at the cancer center for evaluation and recommendation regarding her nutrition. She was advised to call immediately if she has any concerning symptoms in the interval. She was seen during the  multidisciplinary thoracic oncology clinic today by medical oncology, thoracic navigator and social worker. The patient voices understanding of current disease status and treatment options and is in agreement with the current care plan.  All questions were answered. The patient knows to call the clinic with any problems, questions or concerns. We can certainly see the patient much sooner if necessary.  Thank you  so much for allowing me to participate in the care of Azzie Allison [name]. I will continue to follow up the patient with you and assist in her care.  I spent 55 minutes counseling the patient face to face. The total time spent in the appointment was 80 minutes.  Disclaimer: This note was dictated with voice recognition software. Similar sounding words can inadvertently be transcribed and may not be corrected upon review.   Eilleen Kempf January 03, 2018, 2:12 PM

## 2018-01-03 NOTE — Progress Notes (Signed)
This encounter was created in error - please disregard.

## 2018-01-03 NOTE — Telephone Encounter (Signed)
Scheduled appt per 7/18 los - pt is aware of appt date and time.

## 2018-01-03 NOTE — Progress Notes (Signed)
Oncology Nurse Navigator Documentation  Oncology Nurse Navigator Flowsheets 01/03/2018  Navigator Location [facility]  Navigator Encounter Type Clinic/MDC/I spoke with patient today at thoracic clinic. Gave information on DX and treatment plan.  Help facilitate lab work for EGFR.  Will coordinate with [facility] on more pleural fluid for foundation one testing.  I received a call from [place] at [facility] and they obtained pleural fluid today and sent for foundation one testing.   Patient Visit Type MedOnc  Treatment Phase Pre-Tx/Tx Discussion  Barriers/Navigation Needs Education;Coordination of Care  Education Understanding Cancer/ Treatment Options;Newly Diagnosed Cancer Education  Interventions Coordination of Care;Education  Coordination of Care Other  Education Method Verbal;Written  Acuity Level 2  Time Spent with Patient 46

## 2018-01-03 NOTE — Telephone Encounter (Signed)
Oncology Nurse Navigator Documentation  Oncology Nurse Navigator Flowsheets 01/03/2018  Navigator Location [facility]  Navigator Encounter Type Telephone/Patient has an appt today with Dr. Julien Nordmann. I called to see if she would be able to make appt today. I was unable to reach but did leave a vm message to call with my name and phone number.   Treatment Phase Pre-Tx/Tx Discussion  Barriers/Navigation Needs Education  Education Other  Interventions Education  Education Method Verbal  Acuity Level 1  Time Spent with Patient 15

## 2018-01-03 NOTE — Progress Notes (Signed)
Allison [name] came to the office today for her R pleurX catheter to be drained. She has been set up with [facility] for home care but has not heard from them. 250 cc of amber drainage resulted and she did experience pain and drainage had to be stopped. She said she was scheduled for M-W-F.  I said that I would contact [facility] for her and call her back. According to the care management notes, care was to begin today. When I called they said they were in contact with her insurance to see if co-pays were involved, etc. A specimen was sent  for Foundation I as requested by oncology. I called Allison [name] with the info that I had learned from [facility].

## 2018-01-04 ENCOUNTER — Encounter: Payer: Self-pay | Admitting: *Deleted

## 2018-01-04 DIAGNOSIS — Z4682 Encounter for fitting and adjustment of non-vascular catheter: Secondary | ICD-10-CM | POA: Diagnosis not present

## 2018-01-04 DIAGNOSIS — I10 Essential (primary) hypertension: Secondary | ICD-10-CM | POA: Diagnosis not present

## 2018-01-04 DIAGNOSIS — R7303 Prediabetes: Secondary | ICD-10-CM | POA: Diagnosis not present

## 2018-01-04 DIAGNOSIS — C3491 Malignant neoplasm of unspecified part of right bronchus or lung: Secondary | ICD-10-CM | POA: Diagnosis not present

## 2018-01-04 DIAGNOSIS — H409 Unspecified glaucoma: Secondary | ICD-10-CM | POA: Diagnosis not present

## 2018-01-04 DIAGNOSIS — R69 Illness, unspecified: Secondary | ICD-10-CM | POA: Diagnosis not present

## 2018-01-04 DIAGNOSIS — M1991 Primary osteoarthritis, unspecified site: Secondary | ICD-10-CM | POA: Diagnosis not present

## 2018-01-04 DIAGNOSIS — Z7189 Other specified counseling: Secondary | ICD-10-CM | POA: Insufficient documentation

## 2018-01-04 DIAGNOSIS — Z4801 Encounter for change or removal of surgical wound dressing: Secondary | ICD-10-CM | POA: Diagnosis not present

## 2018-01-04 DIAGNOSIS — J45909 Unspecified asthma, uncomplicated: Secondary | ICD-10-CM | POA: Diagnosis not present

## 2018-01-04 DIAGNOSIS — M1990 Unspecified osteoarthritis, unspecified site: Secondary | ICD-10-CM | POA: Diagnosis not present

## 2018-01-04 DIAGNOSIS — Z48813 Encounter for surgical aftercare following surgery on the respiratory system: Secondary | ICD-10-CM | POA: Diagnosis not present

## 2018-01-04 DIAGNOSIS — J910 Malignant pleural effusion: Secondary | ICD-10-CM | POA: Diagnosis not present

## 2018-01-04 DIAGNOSIS — I7300 Raynaud's syndrome without gangrene: Secondary | ICD-10-CM | POA: Diagnosis not present

## 2018-01-04 NOTE — Progress Notes (Signed)
Oncology Nurse Navigator Documentation  Oncology Nurse Navigator Flowsheets 01/04/2018  Navigator Location [facility]  Navigator Encounter Type Other/  Treatment Phase Pre-Tx/Tx Discussion  Barriers/Navigation Needs Coordination of Care  Interventions Coordination of Care  Coordination of Care Other  Acuity Level 1  Time Spent with Patient 15

## 2018-01-07 ENCOUNTER — Emergency Department (HOSPITAL_COMMUNITY): Payer: Medicare HMO

## 2018-01-07 ENCOUNTER — Encounter (HOSPITAL_COMMUNITY): Payer: Self-pay | Admitting: Emergency Medicine

## 2018-01-07 ENCOUNTER — Emergency Department (HOSPITAL_COMMUNITY)
Admission: EM | Admit: 2018-01-07 | Discharge: 2018-01-07 | Disposition: A | Discharge status: Home / Self Care | Payer: Medicare HMO | Attending: Emergency Medicine | Admitting: Emergency Medicine

## 2018-01-07 DIAGNOSIS — C3491 Malignant neoplasm of unspecified part of right bronchus or lung: Secondary | ICD-10-CM | POA: Diagnosis not present

## 2018-01-07 DIAGNOSIS — Z4682 Encounter for fitting and adjustment of non-vascular catheter: Secondary | ICD-10-CM | POA: Diagnosis not present

## 2018-01-07 DIAGNOSIS — R7303 Prediabetes: Secondary | ICD-10-CM | POA: Diagnosis not present

## 2018-01-07 DIAGNOSIS — M1990 Unspecified osteoarthritis, unspecified site: Secondary | ICD-10-CM | POA: Diagnosis not present

## 2018-01-07 DIAGNOSIS — R55 Syncope and collapse: Secondary | ICD-10-CM | POA: Diagnosis not present

## 2018-01-07 DIAGNOSIS — J910 Malignant pleural effusion: Secondary | ICD-10-CM | POA: Diagnosis not present

## 2018-01-07 DIAGNOSIS — I10 Essential (primary) hypertension: Secondary | ICD-10-CM | POA: Insufficient documentation

## 2018-01-07 DIAGNOSIS — J90 Pleural effusion, not elsewhere classified: Secondary | ICD-10-CM | POA: Diagnosis not present

## 2018-01-07 DIAGNOSIS — R69 Illness, unspecified: Secondary | ICD-10-CM | POA: Diagnosis not present

## 2018-01-07 DIAGNOSIS — I7300 Raynaud's syndrome without gangrene: Secondary | ICD-10-CM | POA: Diagnosis not present

## 2018-01-07 DIAGNOSIS — J45909 Unspecified asthma, uncomplicated: Secondary | ICD-10-CM | POA: Diagnosis not present

## 2018-01-07 DIAGNOSIS — Z79899 Other long term (current) drug therapy: Secondary | ICD-10-CM | POA: Insufficient documentation

## 2018-01-07 DIAGNOSIS — R197 Diarrhea, unspecified: Secondary | ICD-10-CM | POA: Diagnosis not present

## 2018-01-07 DIAGNOSIS — R100 Acute abdomen: Secondary | ICD-10-CM | POA: Diagnosis not present

## 2018-01-07 DIAGNOSIS — E161 Other hypoglycemia: Secondary | ICD-10-CM | POA: Diagnosis not present

## 2018-01-07 LAB — BASIC METABOLIC PANEL
Anion gap: 10 (ref 5–15)
BUN: 16 mg/dL (ref 8–23)
CO2: 26 mmol/L (ref 22–32)
Calcium: 9.3 mg/dL (ref 8.9–10.3)
Chloride: 102 mmol/L (ref 98–111)
Creatinine, Ser: 0.75 mg/dL (ref 0.44–1.00)
GFR calc Af Amer: >60 mL/min (ref >60)
GFR calc non Af Amer: >60 mL/min (ref >60)
Glucose, Bld: 101 mg/dL — ABNORMAL HIGH (ref 70–99)
Potassium: 4 mmol/L (ref 3.5–5.1)
Sodium: 138 mmol/L (ref 135–145)

## 2018-01-07 LAB — CBC WITH DIFFERENTIAL/PLATELET
Basophils Absolute: 0 10*3/uL (ref 0.0–0.1)
Basophils Relative: 1 %
Eosinophils Absolute: 0.1 10*3/uL (ref 0.0–0.7)
Eosinophils Relative: 3 %
HCT: 39.3 % (ref 36.0–46.0)
Hemoglobin: 12.9 g/dL (ref 12.0–15.0)
Lymphocytes Relative: 26 %
Lymphs Abs: 1.4 10*3/uL (ref 0.7–4.0)
MCH: 27.3 pg (ref 26.0–34.0)
MCHC: 32.8 g/dL (ref 30.0–36.0)
MCV: 83.3 fL (ref 78.0–100.0)
Monocytes Absolute: 0.3 10*3/uL (ref 0.1–1.0)
Monocytes Relative: 6 %
Neutro Abs: 3.3 10*3/uL (ref 1.7–7.7)
Neutrophils Relative %: 64 %
Platelets: 323 10*3/uL (ref 150–400)
RBC: 4.72 MIL/uL (ref 3.87–5.11)
RDW: 13.5 % (ref 11.5–15.5)
WBC: 5.2 10*3/uL (ref 4.0–10.5)

## 2018-01-07 NOTE — ED Triage Notes (Signed)
Patient here from home with complaints of near syncopal episode. States that she has stage 4 lung cancer. Home health nurse was taking off fluid from the lungs due to pleural effusion. States this has happen multiple times before. Nurse took off 266ml.

## 2018-01-07 NOTE — ED Notes (Signed)
Bed: WA02 Expected date:  Expected time:  Means of arrival:  Comments: EMS-syncope

## 2018-01-07 NOTE — ED Provider Notes (Signed)
[facility] DEPT Provider Note   CSN: [id number] Arrival date & time: 01/07/18  1111     History   Chief Complaint Chief Complaint  Patient presents with  . Near Syncope    HPI Allison [name] is a 67 y.o. female.  She has a new diagnosis of stage IV non-small cell lung cancer and has had recurrent effusions.  She now has a catheter in her right chest to drain her effusions and had her first drainage at home this morning.  The nurse had taken off 250 mL of fluid which is not unusual for the patient has had this done before.  The patient then began experiencing some diaphoresis and feeling lightheaded like she might pass out.  This whole episode lasted about 15 minutes and is now fully resolved.  EMS found her heart rate and her blood pressure to be low when they got there.  She states she is had fluid taken off before and is never caused her the symptoms.  She ultimately feels back to baseline and would like to be discharged but I recommended that she get some tests just to make sure nothing different happened.  The history is provided by the patient.  Near Syncope  This is a new problem. The current episode started less than 1 hour ago. The problem has been resolved. Pertinent negatives include no chest pain, no abdominal pain, no headaches and no shortness of breath. Nothing aggravates the symptoms. Relieved by: time. She has tried rest for the symptoms. The treatment provided significant relief.    Past Medical History:  Diagnosis Date  . Anxiety   . Arthritis   . Asthma    exercise induced  . Depression    PMH  . GERD (gastroesophageal reflux disease)   . Glaucoma   . Hypertension   . Malignant pleural effusion    right  . PONV (postoperative nausea and vomiting)   . Pre-diabetes   . Raynaud's disease   . Raynaud's disease     Patient Active Problem List   Diagnosis Date Noted  . Adenocarcinoma of right lung, stage 4 ([place]) 01/04/2018  .  Goals of care, counseling/discussion 01/04/2018  . Thyroid nodule 12/11/2017  . Pulmonary nodules 12/11/2017  . Pleural effusion, right 12/11/2017  . Mass of upper lobe of right lung 12/10/2016    Past Surgical History:  Procedure Laterality Date  . ABDOMINAL HYSTERECTOMY     partial  . CHEST TUBE INSERTION Right 01/01/2018   Procedure: INSERTION PLEURAL DRAINAGE CATHETER;  Surgeon: Ivin Poot, MD;  Location: [facility];  Service: Thoracic;  Laterality: Right;  . COLONOSCOPY    . DILATION AND CURETTAGE OF UTERUS    . EYE SURGERY     due to Glaucoma  . ROTATOR CUFF REPAIR    . TUBAL LIGATION    . WISDOM TOOTH EXTRACTION       OB History   None      Home Medications    Prior to Admission medications   Medication Sig Start Date End Date Taking? Authorizing Provider  ALPRAZolam Duanne Moron) 0.25 MG tablet Take 0.25mg  by mouth once daily as needed for anxiety   Yes [provider]  amLODipine (NORVASC) 5 MG tablet Take 5 mg by mouth daily. 12/11/17  Yes [provider]  brimonidine (ALPHAGAN P) 0.1 % SOLN Instill 1 drop in both eye twice daily   Yes [provider]  dorzolamide-timolol (COSOPT) 22.3-6.8 MG/ML ophthalmic solution Instill  1 drop in both eyes twice daily   Yes [provider]  hydrochlorothiazide (HYDRODIURIL) 25 MG tablet Take 25 mg by mouth daily. 11/02/17  Yes [provider]  latanoprost (XALATAN) 0.005 % ophthalmic solution Place 1 drop into both eyes at bedtime.  12/14/17  Yes [provider]  albuterol (PROVENTIL HFA;VENTOLIN HFA) 108 (90 Base) MCG/ACT inhaler TAKE 2 PUFFS BY MOUTH EVERY 4 HOURS AS NEEDED wheezing/shortness of breath 12/05/17   [provider]  amitriptyline (ELAVIL) 10 MG tablet Take 10 mg by mouth at bedtime.    [provider]  B Complex-Biotin-FA (B COMPLETE) TABS Take 1 tablet by mouth.    [provider]  clindamycin (CLEOCIN T) 1 % SWAB Apply 1 application topically  2 (two) times daily as needed (rash).     [provider]  cromolyn (OPTICROM) 4 % ophthalmic solution Place 1 drop into both eyes 4 (four) times daily as needed (irritation).     [provider]  Estradiol-Estriol-Progesterone (BIEST/PROGESTERONE) CREA Place 1 application onto the skin 3 (three) times a week.    [provider]  estrogens, conjugated, (PREMARIN) 0.625 MG tablet 0.625 mg daily. Take daily for 21 days then do not take for 7 days.     [provider]  fenofibrate 160 MG tablet fenofibrate 160 mg tablet  TAKE 1 TABLET BY MOUTH EVERY DAY    [provider]  OVER THE COUNTER MEDICATION Take 1 tablet by mouth daily.     [provider]  traMADol (ULTRAM) 50 MG tablet Take 50 mg by mouth 2 (two) times daily as needed. for pain 01/01/18   [provider]  triamcinolone cream (KENALOG) 0.1 % Apply 1 application topically 2 (two) times daily. Patient not taking: Reported on 01/07/2018 10/07/14   Ivar Drape D, PA  Turmeric 500 MG TABS Take 1,000 mg by mouth daily.    [provider]    Family History Family History  Problem Relation Age of Onset  . Heart disease Sister   . Heart disease Brother   . Lung cancer Other     Social History Social History   Tobacco Use  . Smoking status: Never Smoker  . Smokeless tobacco: Never Used  Substance Use Topics  . Alcohol use: Yes    Comment: up to 3 drinks per week  . Drug use: No    Comment: CBD oil      Allergies   Penicillins and Vicodin [hydrocodone-acetaminophen]   Review of Systems Review of Systems  Constitutional: Negative for fever.  HENT: Negative for sore throat.   Eyes: Negative for visual disturbance.  Respiratory: Negative for shortness of breath.   Cardiovascular: Positive for near-syncope. Negative for chest pain.  Gastrointestinal: Negative for abdominal pain.  Genitourinary: Negative for dysuria.  Musculoskeletal: Negative for neck  pain.  Skin: Negative for rash.  Neurological: Negative for headaches.     Physical Exam Updated Vital Signs BP 137/66 (BP Location: Right Arm)   Pulse (!) 48   Temp 98.8 F (37.1 C) (Oral)   Resp 12   SpO2 100%   Physical Exam  Constitutional: She is oriented to person, place, and time. She appears well-developed and well-nourished. No distress.  HENT:  Head: Normocephalic and atraumatic.  Eyes: Conjunctivae are normal.  Neck: Neck supple.  Cardiovascular: Regular rhythm and normal heart sounds. Bradycardia present.  No murmur heard. Pulmonary/Chest: Effort normal. No respiratory distress.  Abdominal: Soft. There is no tenderness.  Musculoskeletal:  Normal range of motion. She exhibits no edema.  Neurological: She is alert and oriented to person, place, and time. She has normal strength. No sensory deficit.  Skin: Skin is warm and dry.  Psychiatric: She has a normal mood and affect.  Nursing note and vitals reviewed.    ED Treatments / Results  Labs (all labs ordered are listed, but only abnormal results are displayed) Labs Reviewed  BASIC METABOLIC PANEL - Abnormal; Notable for the following components:      Result Value   Glucose, Bld 101 (*)    All other components within normal limits  CBC WITH DIFFERENTIAL/PLATELET    EKG None  Radiology Dg Chest 2 View  Result Date: 01/07/2018 CLINICAL DATA:  Dizziness and near syncope following thoracentesis. EXAM: CHEST - 2 VIEW COMPARISON:  01/01/2018 FINDINGS: Cardiomediastinal silhouette is unchanged. A RIGHT pleural catheter is again noted. There is no evidence of pneumothorax. A small to moderate RIGHT pleural effusion is again identified with RIGHT basilar atelectasis. Pulmonary vascular congestion is noted. RIGHT UPPER lung mass again noted. IMPRESSION: Increased pulmonary vascular congestion without other significant change. Small to moderate RIGHT pleural effusion-no pneumothorax. RIGHT pleural catheter remains in  place. Electronically Signed   By: Margarette Canada M.D.   On: 01/07/2018 12:20    Procedures Procedures (including critical care time)  Medications Ordered in ED Medications - No data to display   Initial Impression / Assessment and Plan / ED Course  I have reviewed the triage vital signs and the nursing notes.  Pertinent labs & imaging results that were available during my care of the patient were reviewed by me and considered in my medical decision making (see chart for details).  Clinical Course as of Jan 08 1125  Mon Jan 07, 2018  1328 Patient feels back to baseline her work-up is been unremarkable.  It all seems very transient related to her getting the fluid taken off her long although she is had the procedure multiple times.  Ultimately if feel she can go home and asked her to call her CT surgeon back to get recommendations.  She is comfortable with plan all questions were answered.   [MB]    Clinical Course User Index [MB] Hayden Rasmussen, MD      Final Clinical Impressions(s) / ED Diagnoses   Final diagnoses:  Near syncope    ED Discharge Orders    None       Hayden Rasmussen, MD 01/08/18 [phone]

## 2018-01-07 NOTE — Discharge Instructions (Addendum)
Your evaluated in the emergency department after a near fainting spell while you are getting fluid taken off your lung.  This was likely a vagal episode and your symptoms at all improved by the time he got here.  You had a chest x-ray and blood work that did not show any obvious concerns.  Please call your thoracic surgeon and get their recommendations for the next time any fluid taken off.  Return if any concerns.

## 2018-01-09 ENCOUNTER — Telehealth: Payer: Self-pay

## 2018-01-09 ENCOUNTER — Ambulatory Visit (HOSPITAL_COMMUNITY): Payer: Medicare HMO

## 2018-01-09 DIAGNOSIS — R7303 Prediabetes: Secondary | ICD-10-CM | POA: Diagnosis not present

## 2018-01-09 DIAGNOSIS — C3491 Malignant neoplasm of unspecified part of right bronchus or lung: Secondary | ICD-10-CM | POA: Diagnosis not present

## 2018-01-09 DIAGNOSIS — J910 Malignant pleural effusion: Secondary | ICD-10-CM | POA: Diagnosis not present

## 2018-01-09 DIAGNOSIS — Z4682 Encounter for fitting and adjustment of non-vascular catheter: Secondary | ICD-10-CM | POA: Diagnosis not present

## 2018-01-09 DIAGNOSIS — I10 Essential (primary) hypertension: Secondary | ICD-10-CM | POA: Diagnosis not present

## 2018-01-09 DIAGNOSIS — M1990 Unspecified osteoarthritis, unspecified site: Secondary | ICD-10-CM | POA: Diagnosis not present

## 2018-01-09 DIAGNOSIS — R69 Illness, unspecified: Secondary | ICD-10-CM | POA: Diagnosis not present

## 2018-01-09 DIAGNOSIS — I7300 Raynaud's syndrome without gangrene: Secondary | ICD-10-CM | POA: Diagnosis not present

## 2018-01-09 DIAGNOSIS — J45909 Unspecified asthma, uncomplicated: Secondary | ICD-10-CM | POA: Diagnosis not present

## 2018-01-09 NOTE — Telephone Encounter (Signed)
Vaughan Basta, RN with [facility] [phone] called and stated that she has Allison [name]'s PleurX set up and someone was to come out to her house this week to teach drainage and dressing changes.

## 2018-01-10 ENCOUNTER — Ambulatory Visit (HOSPITAL_COMMUNITY)
Admission: RE | Admit: 2018-01-10 | Discharge: 2018-01-10 | Disposition: A | Discharge status: Home / Self Care | Payer: Medicare HMO | Source: Ambulatory Visit | Attending: Internal Medicine | Admitting: Internal Medicine

## 2018-01-10 DIAGNOSIS — I6782 Cerebral ischemia: Secondary | ICD-10-CM | POA: Insufficient documentation

## 2018-01-10 DIAGNOSIS — R220 Localized swelling, mass and lump, head: Secondary | ICD-10-CM | POA: Insufficient documentation

## 2018-01-10 DIAGNOSIS — C3490 Malignant neoplasm of unspecified part of unspecified bronchus or lung: Secondary | ICD-10-CM | POA: Diagnosis not present

## 2018-01-10 LAB — EPIDERMAL GROWTH FACTOR RECEPTOR (EGFR) MUTATION ANALYSIS

## 2018-01-10 MED ORDER — GADOBENATE DIMEGLUMINE 529 MG/ML IV SOLN
15.0000 mL | Freq: Once | INTRAVENOUS | Status: AC | PRN
  Administered 2018-01-10: 11 mL via INTRAVENOUS

## 2018-01-11 ENCOUNTER — Telehealth: Payer: Self-pay

## 2018-01-11 DIAGNOSIS — M1990 Unspecified osteoarthritis, unspecified site: Secondary | ICD-10-CM | POA: Diagnosis not present

## 2018-01-11 DIAGNOSIS — J910 Malignant pleural effusion: Secondary | ICD-10-CM | POA: Diagnosis not present

## 2018-01-11 DIAGNOSIS — I10 Essential (primary) hypertension: Secondary | ICD-10-CM | POA: Diagnosis not present

## 2018-01-11 DIAGNOSIS — R7303 Prediabetes: Secondary | ICD-10-CM | POA: Diagnosis not present

## 2018-01-11 DIAGNOSIS — C3491 Malignant neoplasm of unspecified part of right bronchus or lung: Secondary | ICD-10-CM | POA: Diagnosis not present

## 2018-01-11 DIAGNOSIS — Z4682 Encounter for fitting and adjustment of non-vascular catheter: Secondary | ICD-10-CM | POA: Diagnosis not present

## 2018-01-11 DIAGNOSIS — R69 Illness, unspecified: Secondary | ICD-10-CM | POA: Diagnosis not present

## 2018-01-11 DIAGNOSIS — I7300 Raynaud's syndrome without gangrene: Secondary | ICD-10-CM | POA: Diagnosis not present

## 2018-01-11 DIAGNOSIS — J45909 Unspecified asthma, uncomplicated: Secondary | ICD-10-CM | POA: Diagnosis not present

## 2018-01-11 NOTE — Telephone Encounter (Signed)
Per Mikey Bussing NP called patient back explained Dr. Julien Nordmann not in office today, will review her results and Discuss with her next week at her office visit, patient verbalized an understanding.

## 2018-01-11 NOTE — Telephone Encounter (Signed)
Patient calls requesting someone from Dr. Lew Dawes office call her and explain the blood test result that would show what gene's she is positive for.  Her [phone]

## 2018-01-14 ENCOUNTER — Telehealth: Payer: Self-pay

## 2018-01-14 DIAGNOSIS — J45909 Unspecified asthma, uncomplicated: Secondary | ICD-10-CM | POA: Diagnosis not present

## 2018-01-14 DIAGNOSIS — Z4682 Encounter for fitting and adjustment of non-vascular catheter: Secondary | ICD-10-CM | POA: Diagnosis not present

## 2018-01-14 DIAGNOSIS — R69 Illness, unspecified: Secondary | ICD-10-CM | POA: Diagnosis not present

## 2018-01-14 DIAGNOSIS — J910 Malignant pleural effusion: Secondary | ICD-10-CM | POA: Diagnosis not present

## 2018-01-14 DIAGNOSIS — R7303 Prediabetes: Secondary | ICD-10-CM | POA: Diagnosis not present

## 2018-01-14 DIAGNOSIS — I10 Essential (primary) hypertension: Secondary | ICD-10-CM | POA: Diagnosis not present

## 2018-01-14 DIAGNOSIS — I7300 Raynaud's syndrome without gangrene: Secondary | ICD-10-CM | POA: Diagnosis not present

## 2018-01-14 DIAGNOSIS — M1990 Unspecified osteoarthritis, unspecified site: Secondary | ICD-10-CM | POA: Diagnosis not present

## 2018-01-14 DIAGNOSIS — C3491 Malignant neoplasm of unspecified part of right bronchus or lung: Secondary | ICD-10-CM | POA: Diagnosis not present

## 2018-01-14 NOTE — Assessment & Plan Note (Addendum)
This is a very pleasant 67 year old never smoker African-American female recently diagnosed with stage IV (T2 a,N2, M1a) non-small cell lung cancer, adenocarcinoma diagnosed in July 2019 and presented with right upper lobe lung mass in addition to mediastinal lymphadenopathy as well as bilateral pulmonary nodules and malignant right pleural effusion. She had a recent MRI of the brain and blood work performed and is here to discuss the results and treatment options.  The patient was seen with Dr. Julien Nordmann.  MRI of the brain results were discussed with the patient which showed no evidence of metastatic disease.  There is a small incidental meningioma.  We discussed that the EGFR lab test that was sent was negative for mutation.  PDL 1 testing was 1%.  Additional tests were sent for Foundation 1 testing but there was insufficient tissue.  Recommend that she have additional lab work performed today for [place] 360.  Anticipate that we will have these results within 7 business days.  We will schedule her back in approximately 10 days for discussion of these results and further discussion of her treatment options.  We had a brief discussion with the patient today that if her guardant 360 testing does not show any actionable mutations, we will consider her for treatment with chemotherapy combined with immunotherapy.  For the recurrent right pleural effusion, she will continue the drainage of the pleural fluid via the Pleurx catheter under the care of Dr. Prescott Gum.  For the weight loss and malnutrition, she will see our dietitian later today.  She was advised to call immediately if she has any concerning symptoms in the interval. The patient voices understanding of current disease status and treatment options and is in agreement with the current care plan.

## 2018-01-14 NOTE — Progress Notes (Signed)
Allison [name]  Allison [name], Allison [name] [place] [address]  DIAGNOSIS: Stage IV (T2 a,N2, M1a) non-small cell lung cancer, adenocarcinoma diagnosed in July 2019 and presented with right upper lobe lung mass in addition to mediastinal lymphadenopathy as well as bilateral pulmonary nodules and malignant right pleural effusion.  PD-L1 1%  PRIOR THERAPY: None  CURRENT THERAPY: None  INTERVAL HISTORY: Allison [name] 67 y.o. female returns for routine follow-up visit by herself.  The patient is feeling fine today and has no specific complaints.  She has a Pleurx catheter in place and has been draining this every other day.  She gets anywhere from 350 cc to 750 cc out when she drains it.  She does have some intermittent pain with draining.  She has a follow-up with Dr. Prescott Gum tomorrow.  Patient denies fevers and chills.  Denies chest pain, shortness of breath, cough, hemoptysis.  Denies nausea, vomiting, constipation, diarrhea.  Reports a stable weight since her last visit but has lost weight in the past.  The patient had a recent MRI of the brain and lab testing performed and is here to discuss the results.  MEDICAL HISTORY: Past Medical History:  Diagnosis Date  . Anxiety   . Arthritis   . Asthma    exercise induced  . Depression    PMH  . GERD (gastroesophageal reflux disease)   . Glaucoma   . Hypertension   . Malignant pleural effusion    right  . PONV (postoperative nausea and vomiting)   . Pre-diabetes   . Raynaud's disease   . Raynaud's disease     ALLERGIES:  is allergic to penicillins and vicodin [hydrocodone-acetaminophen].  MEDICATIONS:  Current Outpatient Medications  Medication Sig Dispense Refill  . albuterol (PROVENTIL HFA;VENTOLIN HFA) 108 (90 Base) MCG/ACT inhaler TAKE 2 PUFFS BY MOUTH EVERY 4 HOURS AS NEEDED wheezing/shortness of breath  3  . ALPRAZolam (XANAX) 0.25 MG tablet Take 0.25m by mouth  once daily as needed for anxiety    . amitriptyline (ELAVIL) 10 MG tablet Take 10 mg by mouth at bedtime.    .Marland KitchenamLODipine (NORVASC) 5 MG tablet Take 5 mg by mouth daily.  3  . brimonidine (ALPHAGAN P) 0.1 % SOLN Instill 1 drop in both eye twice daily    . dorzolamide-timolol (COSOPT) 22.3-6.8 MG/ML ophthalmic solution Instill 1 drop in both eyes twice daily    . hydrochlorothiazide (HYDRODIURIL) 25 MG tablet Take 25 mg by mouth daily.  4  . latanoprost (XALATAN) 0.005 % ophthalmic solution Place 1 drop into both eyes at bedtime.     . Multiple Vitamin (MULTIVITAMIN WITH MINERALS) TABS tablet Take 1 tablet by mouth daily. Max 1    . OVER THE COUNTER MEDICATION Take 1 tablet by mouth daily. Lung Detox    . traMADol (ULTRAM) 50 MG tablet Take 50 mg by mouth 2 (two) times daily as needed. for pain  0  . triamcinolone cream (KENALOG) 0.1 % Apply 1 application topically 2 (two) times daily. (Patient not taking: Reported on 01/07/2018) 30 g 0   No current facility-administered medications for this visit.     SURGICAL HISTORY:  Past Surgical History:  Procedure Laterality Date  . ABDOMINAL HYSTERECTOMY     partial  . CHEST TUBE INSERTION Right 01/01/2018   Procedure: INSERTION PLEURAL DRAINAGE CATHETER;  Surgeon: Allison [name];  [place] [facility]  Service: Thoracic;  Laterality: Right;  . COLONOSCOPY    .  DILATION AND CURETTAGE OF UTERUS    . EYE SURGERY     due to Glaucoma  . ROTATOR CUFF REPAIR    . TUBAL LIGATION    . WISDOM TOOTH EXTRACTION      REVIEW OF SYSTEMS:   Review of Systems  Constitutional: Negative for appetite change, chills, fatigue, fever.  HENT:   Negative for mouth sores, nosebleeds, sore throat and trouble swallowing.   Eyes: Negative for eye problems and icterus.  Respiratory: Negative for cough, hemoptysis, shortness of breath and wheezing.   Cardiovascular: Negative for chest pain and leg swelling.  Gastrointestinal: Negative for abdominal pain, constipation,  diarrhea, nausea and vomiting.  Genitourinary: Negative for bladder incontinence, difficulty urinating, dysuria, frequency and hematuria.   Musculoskeletal: Negative for back pain, gait problem, neck pain and neck stiffness.  Skin: Negative for itching and rash.  Neurological: Negative for dizziness, extremity weakness, gait problem, headaches, light-headedness and seizures.  Hematological: Negative for adenopathy. Does not bruise/bleed easily.  Psychiatric/Behavioral: Negative for confusion, depression and sleep disturbance. The patient is not nervous/anxious.     PHYSICAL EXAMINATION:  Blood pressure (!) 171/73, pulse 61, temperature 98.1 F (36.7 C), temperature source Oral, resp. rate 17, height 5' 4.5" (1.638 m), weight 126 lb 8 oz (57.4 kg), SpO2 100 %.  ECOG PERFORMANCE STATUS: 1 - Symptomatic but completely ambulatory  Physical Exam  Constitutional: Oriented to person, place, and time and well-developed, well-nourished, and in no distress. No distress.  HENT:  Head: Normocephalic and atraumatic.  Mouth/Throat: Oropharynx is clear and moist. No oropharyngeal exudate.  Eyes: Conjunctivae are normal. Right eye exhibits no discharge. Left eye exhibits no discharge. No scleral icterus.  Neck: Normal range of motion. Neck supple.  Cardiovascular: Normal rate, regular rhythm, normal heart sounds and intact distal pulses.   Pulmonary/Chest: Effort normal. No respiratory distress.  Diminished breath sounds right base.  Abdominal: Soft. Bowel sounds are normal. Exhibits no distension and no mass. There is no tenderness.  Musculoskeletal: Normal range of motion. Exhibits no edema.  Lymphadenopathy:    No cervical adenopathy.  Neurological: Alert and oriented to person, place, and time. Exhibits normal muscle tone. Gait normal. Coordination normal.  Skin: Skin is warm and dry. No rash noted. Not diaphoretic. No erythema. No pallor.  Psychiatric: Mood, memory and judgment normal.  Vitals  reviewed.  LABORATORY DATA: Lab Results  Component Value Date   WBC 5.2 01/07/2018   HGB 12.9 01/07/2018   HCT 39.3 01/07/2018   MCV 83.3 01/07/2018   PLT 323 01/07/2018      Chemistry      Component Value Date/Time   NA 138 01/07/2018 1159   K 4.0 01/07/2018 1159   CL 102 01/07/2018 1159   CO2 26 01/07/2018 1159   BUN 16 01/07/2018 1159   CREATININE 0.75 01/07/2018 1159   CREATININE 0.86 01/03/2018 1400      Component Value Date/Time   CALCIUM 9.3 01/07/2018 1159   ALKPHOS 55 01/03/2018 1400   AST 21 01/03/2018 1400   ALT 19 01/03/2018 1400   BILITOT 0.2 (L) 01/03/2018 1400       RADIOGRAPHIC STUDIES:  Dg Chest 1 View  Result Date: 12/19/2017 CLINICAL DATA:  Large right pleural effusion. Right lung mass. Status post thoracentesis. EXAM: CHEST  1 VIEW COMPARISON:  Chest x-ray dated 12/04/2017 and chest CT dated 12/11/2017 FINDINGS: There has been a marked decrease in right pleural effusion after thoracentesis. No pneumothorax. There is a small to moderate residual right pleural  effusion. Poorly defined 2.5 cm mass in the right upper lobe is noted. Left lung is clear. Heart size and vascularity are normal. No bone abnormality. IMPRESSION: 1. No pneumothorax after thoracentesis. 2. Marked reduction in pleural effusion with a small to moderate residual effusion. 3. Ill-defined mass in the right lung apex worrisome for carcinoma. Electronically Signed   By: Lorriane Shire M.D.   On: 12/19/2017 15:28   Dg Chest 2 View  Result Date: 01/07/2018 CLINICAL DATA:  Dizziness and near syncope following thoracentesis. EXAM: CHEST - 2 VIEW COMPARISON:  01/01/2018 FINDINGS: Cardiomediastinal silhouette is unchanged. A RIGHT pleural catheter is again noted. There is no evidence of pneumothorax. A small to moderate RIGHT pleural effusion is again identified with RIGHT basilar atelectasis. Pulmonary vascular congestion is noted. RIGHT UPPER lung mass again noted. IMPRESSION: Increased pulmonary  vascular congestion without other significant change. Small to moderate RIGHT pleural effusion-no pneumothorax. RIGHT pleural catheter remains in place. Electronically Signed   By: Margarette Canada M.D.   On: 01/07/2018 12:20   Dg Chest 2 View  Result Date: 01/01/2018 CLINICAL DATA:  Preoperative evaluation for placement of a pleural drainage catheter, history of malignant RIGHT pleural effusion, asthma, hypertension EXAM: CHEST - 2 VIEW COMPARISON:  12/20/2014 FINDINGS: Upper normal heart size. Mediastinal contours and pulmonary vascularity normal. Atherosclerotic calcification aortic arch. Moderate RIGHT pleural effusion and basilar atelectasis, slightly increased. Opacities at the RIGHT upper lobe are unchanged, corresponding to tumor seen by prior CT. LEFT lung clear. No pneumothorax or acute osseous findings. IMPRESSION: Known RIGHT upper lobe mass. Increased RIGHT pleural effusion and basilar atelectasis. Electronically Signed   By: Lavonia Dana M.D.   On: 01/01/2018 12:58   Mr Jeri Cos IH Contrast  Result Date: 01/10/2018 CLINICAL DATA:  Non-small cell lung cancer staging. EXAM: MRI HEAD WITHOUT AND WITH CONTRAST TECHNIQUE: Multiplanar, multiecho pulse sequences of the brain and surrounding structures were obtained without and with intravenous contrast. CONTRAST:  23m MULTIHANCE GADOBENATE DIMEGLUMINE 529 MG/ML IV SOLN COMPARISON:  None. FINDINGS: Brain: There is no evidence of acute infarct, intracranial hemorrhage, midline shift, or extra-axial fluid collection. The ventricles and sulci are normal. Patchy T2 hyperintensities in the subcortical and deep cerebral white matter nonspecific but compatible with moderate chronic small vessel ischemic disease. A 6 mm enhancing extra-axial mass projects posteriorly from the right temporal bone in the posterior fossa without cerebellar mass effect or edema. No abnormal brain parenchymal or leptomeningeal enhancement is identified. Vascular: Major intracranial  vascular flow voids are preserved. Skull and upper cervical spine: Unremarkable bone marrow signal. Sinuses/Orbits: Unremarkable orbits. Small left maxillary sinus mucous retention cyst. Clear mastoid air cells. Other: None. IMPRESSION: 1. No evidence of intracranial metastases. 2. 6 mm extra-axial mass in the posterior fossa most compatible with an incidental meningioma. 3. Moderate chronic small vessel ischemic disease. Electronically Signed   By: ALogan BoresM.D.   On: 01/10/2018 15:51   Nm Pet Image Initial (pi) Skull Base To Thigh  Result Date: 12/26/2017 CLINICAL DATA:  Initial treatment strategy for newly diagnosed right lung adenocarcinoma with malignant right pleural effusion as proven by thoracentesis. EXAM: NUCLEAR MEDICINE PET SKULL BASE TO THIGH TECHNIQUE: 6.2 mCi F-18 FDG was injected intravenously. Full-ring PET imaging was performed from the skull base to thigh after the radiotracer. CT data was obtained and used for attenuation correction and anatomic localization. Fasting blood glucose: 87 mg/dl COMPARISON:  12/11/2017 chest CT. FINDINGS: Mediastinal blood pool activity: SUV max 2.4 NECK: No hypermetabolic lymph  nodes in the neck. Incidental CT findings: none CHEST: Hypermetabolic spiculated 3.3 x 2.6 cm apical right upper lobe lung mass with max SUV 10.1 (series 8/image 18). Numerous scattered solid pulmonary nodules in both lungs (> 10), several of which are pleural-based in the right lung, largest a hypermetabolic 1.6 cm anterior right lower lobe nodule associated with the major fissure with max SUV 3.3 (series 8/image 41). The remaining nodules are subcentimeter and below PET resolution. Moderate dependent right pleural effusion with mild smooth right pleural thickening. No left pleural effusion. Mildly hypermetabolic right hilar node with max SUV 3.4. Mildly hypermetabolic right paratracheal 1.1 cm node with max SUV 4.8 (series 4/image 63). No additional hypermetabolic thoracic nodes.  Incidental CT findings: Atherosclerotic nonaneurysmal thoracic aorta. ABDOMEN/PELVIS: No abnormal hypermetabolic activity within the liver, pancreas, adrenal glands, or spleen. No hypermetabolic lymph nodes in the abdomen or pelvis. Incidental CT findings: Hysterectomy. Trace free fluid in the pelvis, nonspecific. Atherosclerotic nonaneurysmal abdominal aorta. SKELETON: No focal hypermetabolic activity to suggest skeletal metastasis. Mild hypermetabolism at the right greater left sternoclavicular joints correlating with mild degenerative changes, favored to be degenerative in etiology. Incidental CT findings: No hypermetabolism at mixed lytic and sclerotic T9 vertebral lesion described on the recent chest CT, which is compatible with a vertebral hemangioma. IMPRESSION: 1. Hypermetabolic spiculated 3.3 cm apical right upper lobe lung mass, compatible with primary bronchogenic adenocarcinoma. 2. Hypermetabolic right hilar and right paratracheal lymphadenopathy. 3. Moderate dependent right pleural effusion with mild right pleural thickening, compatible with known malignant right pleural effusion. 4. Hypermetabolic 1.6 cm anterior right lower lobe nodule associated with the major fissure, compatible with pulmonary metastasis. Numerous additional subcentimeter pulmonary nodules scattered in both lungs, below PET resolution, suspicious for pulmonary metastases. Recommend attention on follow-up chest CT in 3 months. 5. No extrathoracic or osseous hypermetabolic metastatic disease. 6.  Aortic Atherosclerosis (ICD10-I70.0). Electronically Signed   By: Ilona Sorrel M.D.   On: 12/26/2017 15:17   Dg Chest Port 1 View  Result Date: 01/01/2018 CLINICAL DATA:  Post insertion of pleural drainage catheter on the right EXAM: PORTABLE CHEST 1 VIEW COMPARISON:  Chest x-ray of 01/01/2018 FINDINGS: A pleural drainage catheter has been inserted with the tip at the right lung apex. Medial right apical lung mass is again noted. No  pneumothorax is seen. The amount of fluid has diminished somewhat in the interval. The left lung is clear. Heart size is stable. IMPRESSION: 1. New pleural catheter inserted with the tip near the apex of the right lung. 2. Decrease in volume of right pleural effusion. 3. No change in medial right upper lobe lung mass. Electronically Signed   By: Ivar Drape M.D.   On: 01/01/2018 16:03   Dg Fluoro Guide Cv Line-no Report  Result Date: 01/01/2018 Fluoroscopy was utilized by the requesting physician.  No radiographic interpretation.   US Thoracentesis Asp Pleural Space W/img Guide  Result Date: 12/19/2017 INDICATION: Patient with history of cough, dyspnea, dominant right upper lobe lung mass with scattered bilateral lung nodules, right pleural effusion; request made for diagnostic and therapeutic right thoracentesis. EXAM: ULTRASOUND GUIDED DIAGNOSTIC AND THERAPEUTIC RIGHT THORACENTESIS MEDICATIONS: None COMPLICATIONS: None immediate. PROCEDURE: An ultrasound guided thoracentesis was thoroughly discussed with the patient and questions answered. The benefits, risks, alternatives and complications were also discussed. The patient understands and wishes to proceed with the procedure. Written consent was obtained. Ultrasound was performed to localize and mark an adequate pocket of fluid in the right chest. The area was then prepped  and draped in the normal sterile fashion. 1% Lidocaine was used for local anesthesia. Under ultrasound guidance a 6 Fr Safe-T-Centesis catheter was introduced. Thoracentesis was performed. The catheter was removed and a dressing applied. FINDINGS: A total of approximately 1.4 liters of yellow fluid was removed. Samples were sent to the laboratory as requested by the clinical team. IMPRESSION: Successful ultrasound guided diagnostic and therapeutic right thoracentesis yielding 1.4 liters of pleural fluid. Read by: Rowe Robert, PA-C Electronically Signed   By: Aletta Edouard M.D.   On:  12/19/2017 15:19     ASSESSMENT/PLAN:  Adenocarcinoma of right lung, stage 4 (HCC) This is a very pleasant 67 year old never smoker African-American female recently diagnosed with stage IV (T2 a,N2, M1a) non-small cell lung cancer, adenocarcinoma diagnosed in July 2019 and presented with right upper lobe lung mass in addition to mediastinal lymphadenopathy as well as bilateral pulmonary nodules and malignant right pleural effusion. She had a recent MRI of the brain and blood work performed and is here to discuss the results and treatment options.  The patient was seen with Dr. Julien Nordmann.  MRI of the brain results were discussed with the patient which showed no evidence of metastatic disease.  There is a small incidental meningioma.  We discussed that the EGFR lab test that was sent was negative for mutation.  PDL 1 testing was 1%.  Additional tests were sent for [facility] testing but there was insufficient tissue.  Recommend that she have additional lab work performed today for [facility].  Anticipate that we will have these results within 7 business days.  We will schedule her back in approximately 10 days for discussion of these results and further discussion of her treatment options.  We had a brief discussion with the patient today that if her [facility] testing does not show any actionable mutations, we will consider her for treatment with chemotherapy combined with immunotherapy.  For the recurrent right pleural effusion, she will continue the drainage of the pleural fluid via the Pleurx catheter under the care of Dr. Prescott Gum.  For the weight loss and malnutrition, she will see our dietitian later today.  She was advised to call immediately if she has any concerning symptoms in the interval. The patient voices understanding of current disease status and treatment options and is in agreement with the current care plan.   Orders Placed This Encounter  Procedures  . [facility]     Standing Status:   Future    Number of Occurrences:   1    Standing Expiration Date:   01/15/2019   Mikey Bussing, DNP, AGPCNP-BC, AOCNP 01/15/18  ADDENDUM: Hematology/Oncology Attending: I had a face-to-face encounter with the patient today.  I recommended her care plan.  This is a very pleasant 67 years old African-American female recently diagnosed with a stage IV non-small cell lung cancer, adenocarcinoma in July 2019 status post right Pleurx catheter placement by Dr. Prescott Gum.  She continues to have drainage of the pleural fluid 2-3 times a week.  She is feeling fine with no concerning complaints except for mild pain with the drainage.  The patient had tissue block sent to [facility] for molecular studies but unfortunately the specimen was not sufficient for molecular testing.  PDL 1 expression was reported as 1%.  She had MRI of the brain performed recently that showed no concerning findings for metastatic disease to the brain but there was a small meningioma. I had a lengthy discussion  with the patient today about her condition.  I will give her the option of starting systemic chemotherapy in combination with immunotherapy versus waiting for molecular studies by blood test from [facility] which is expected to take around 7 working days.  The patient would like to wait for the molecular studies to become available before making decision regarding her treatment.  She is currently asymptomatic except for the mild shortness of breath. I will arrange for her to come back for follow-up visit in around 10 days for evaluation and discussion of her treatment options based on the molecular studies. She was advised to call immediately if she has any concerning symptoms in the interval.  Disclaimer: This [name] was dictated with voice recognition software. Similar sounding words can inadvertently be transcribed and may be missed upon review. Eilleen Kempf, [name] 01/15/18

## 2018-01-14 NOTE — Telephone Encounter (Signed)
Per 7/29 phone que patient was requesting a later appointment. How ever I was unable to r/s . Patient okayed to leave as is.

## 2018-01-14 NOTE — Telephone Encounter (Signed)
Vaughan Basta, nursing with [facility] called [phone] asking if Allison [name] needed to be drained before her appointment with Dr. Prescott Gum on 01/16/18.  I advised that we would drain her here, in the office.  I also requested for her to bring a drainage kit with her as well so we could drain.  Vaughan Basta acknowledged receipt and will make the patient aware.

## 2018-01-15 ENCOUNTER — Encounter: Payer: Self-pay | Admitting: Oncology

## 2018-01-15 ENCOUNTER — Telehealth: Payer: Self-pay | Admitting: Oncology

## 2018-01-15 ENCOUNTER — Inpatient Hospital Stay: Payer: Medicare HMO | Admitting: Oncology

## 2018-01-15 ENCOUNTER — Inpatient Hospital Stay: Payer: Medicare HMO

## 2018-01-15 ENCOUNTER — Inpatient Hospital Stay: Payer: Medicare HMO | Admitting: Nutrition

## 2018-01-15 VITALS — BP 171/73 | HR 61 | Temp 98.1°F | Resp 17 | Ht 64.5 in | Wt 126.5 lb

## 2018-01-15 DIAGNOSIS — E43 Unspecified severe protein-calorie malnutrition: Secondary | ICD-10-CM

## 2018-01-15 DIAGNOSIS — C3491 Malignant neoplasm of unspecified part of right bronchus or lung: Secondary | ICD-10-CM

## 2018-01-15 DIAGNOSIS — J910 Malignant pleural effusion: Secondary | ICD-10-CM | POA: Diagnosis not present

## 2018-01-15 DIAGNOSIS — C3411 Malignant neoplasm of upper lobe, right bronchus or lung: Secondary | ICD-10-CM | POA: Diagnosis not present

## 2018-01-15 NOTE — Progress Notes (Signed)
67 year old female diagnosed with lung cancer.  She is a patient of Dr. Julien Nordmann  Past medical history includes Raynaud's disease, prediabetes, hypertension, GERD, depression, and arthritis.  Medications include Xanax and multivitamin.  Labs include glucose 101 on July 22  Height: 64.5 inches. Weight: 127 pounds. Usual body weight: 155 pounds in 2016. BMI: 21.46.  Patient endorses 30 pound weight loss however I cannot confirm timeframe. Patient reports she had increased activity in an effort to lose weight and avoid diabetes.   States she also cleaned up her diet and incorporated more fruits and vegetables and less processed foods.  She also reduce the amount of meat she was eating. She states this was in February 2018. Patient does not want to lose more weight and would like to know what to eat to prepare for treatment.  Nutrition diagnosis: Food and nutrition related knowledge deficit related to new diagnosis of lung cancer as evidenced by no prior need for nutrition related information  Intervention: Patient was educated to consume smaller more frequent meals and snacks. Educated her on the importance of higher calorie, higher protein foods. Recommended she continue plant-based diet if tolerated. Reviewed oral nutrition supplements with patient and provided samples.  Also provided patient with coupons. Questions were answered.  Teach back method used.  Contact information given.  Fact sheets were provided.  Monitoring, evaluation, goals: Patient will tolerate increased calories and protein to minimize further weight loss.  Patient to contact me if follow-up required.  **Disclaimer: This note was dictated with voice recognition software. Similar sounding words can inadvertently be transcribed and this note may contain transcription errors which may not have been corrected upon publication of note.**

## 2018-01-15 NOTE — Telephone Encounter (Signed)
Scheduled appt per 7/30 los - gave patient AVS and calender per los.

## 2018-01-16 ENCOUNTER — Other Ambulatory Visit: Payer: Self-pay

## 2018-01-16 ENCOUNTER — Ambulatory Visit: Payer: Medicare HMO | Admitting: Cardiothoracic Surgery

## 2018-01-16 ENCOUNTER — Encounter: Payer: Self-pay | Admitting: Cardiothoracic Surgery

## 2018-01-16 VITALS — BP 140/70 | HR 63 | Resp 18 | Ht 64.5 in | Wt 126.0 lb

## 2018-01-16 DIAGNOSIS — Z9689 Presence of other specified functional implants: Secondary | ICD-10-CM | POA: Diagnosis not present

## 2018-01-16 DIAGNOSIS — C3491 Malignant neoplasm of unspecified part of right bronchus or lung: Secondary | ICD-10-CM

## 2018-01-16 DIAGNOSIS — J910 Malignant pleural effusion: Secondary | ICD-10-CM | POA: Diagnosis not present

## 2018-01-16 NOTE — Progress Notes (Signed)
PCP is [name], MD Referring Provider is [name], MD  Chief Complaint  Patient presents with  . Routine Post Op    f/u R pleurX insertion 01/01/18 with a CXR    HPI: Postop visit for right Pleurx catheter to drain malignant pleural effusion, adenocarcinoma.  Drainage Monday Wednesday Friday.  Catheter placed approximately 2 weeks ago.  Drainage is 600-750 cc.  Patient is being evaluated by Dr. [name] for targeted chemotherapy, molecular studies pending.  Patient shortness of breath significantly improved after Pleurx catheter placement.  Patient still has some pain related to the catheter placement in her chest and with the end of the drainage session.  She is reassured that this will get better and that is not unusual.  Past Medical History:  Diagnosis Date  . Anxiety   . Arthritis   . Asthma    exercise induced  . Depression    PMH  . GERD (gastroesophageal reflux disease)   . Glaucoma   . Hypertension   . Malignant pleural effusion    right  . PONV (postoperative nausea and vomiting)   . Pre-diabetes   . Raynaud's disease   . Raynaud's disease     Past Surgical History:  Procedure Laterality Date  . ABDOMINAL HYSTERECTOMY     partial  . CHEST TUBE INSERTION Right 01/01/2018   Procedure: INSERTION PLEURAL DRAINAGE CATHETER;  Surgeon: Ivin Poot, MD;  Location: [facility];  Service: Thoracic;  Laterality: Right;  . COLONOSCOPY    . DILATION AND CURETTAGE OF UTERUS    . EYE SURGERY     due to Glaucoma  . ROTATOR CUFF REPAIR    . TUBAL LIGATION    . WISDOM TOOTH EXTRACTION      Family History  Problem Relation Age of Onset  . Heart disease Sister   . Heart disease Brother   . Lung cancer Other     Social History Social History   Tobacco Use  . Smoking status: Never Smoker  . Smokeless tobacco: Never Used  Substance Use Topics  . Alcohol use: Yes    Comment: up to 3 drinks per week  . Drug use: No    Comment: CBD oil     Current  Outpatient Medications  Medication Sig Dispense Refill  . albuterol (PROVENTIL HFA;VENTOLIN HFA) 108 (90 Base) MCG/ACT inhaler TAKE 2 PUFFS BY MOUTH EVERY 4 HOURS AS NEEDED wheezing/shortness of breath  3  . ALPRAZolam (XANAX) 0.25 MG tablet Take 0.25mg  by mouth once daily as needed for anxiety    . amitriptyline (ELAVIL) 10 MG tablet Take 10 mg by mouth at bedtime.    Marland Kitchen amLODipine (NORVASC) 5 MG tablet Take 5 mg by mouth daily.  3  . brimonidine (ALPHAGAN P) 0.1 % SOLN Instill 1 drop in both eye twice daily    . dorzolamide-timolol (COSOPT) 22.3-6.8 MG/ML ophthalmic solution Instill 1 drop in both eyes twice daily    . hydrochlorothiazide (HYDRODIURIL) 25 MG tablet Take 25 mg by mouth daily.  4  . latanoprost (XALATAN) 0.005 % ophthalmic solution Place 1 drop into both eyes at bedtime.     . Multiple Vitamin (MULTIVITAMIN WITH MINERALS) TABS tablet Take 1 tablet by mouth daily. Max 1    . OVER THE COUNTER MEDICATION Take 1 tablet by mouth daily. Lung Detox    . traMADol (ULTRAM) 50 MG tablet Take 50 mg by mouth 2 (two) times daily as needed. for pain  0  . triamcinolone cream (  KENALOG) 0.1 % Apply 1 application topically 2 (two) times daily. (Patient not taking: Reported on 01/07/2018) 30 g 0   No current facility-administered medications for this visit.     Allergies  Allergen Reactions  . Penicillins Other (See Comments)    SYNCOPE PATIENT HAS HAD A PCN REACTION WITH IMMEDIATE RASH, FACIAL/TONGUE/THROAT SWELLING, SOB, OR LIGHTHEADEDNESS WITH HYPOTENSION:  #  #  YES  #  # Has patient had a PCN reaction causing severe rash involving mucus membranes or skin necrosis: No Has patient had a PCN reaction that required hospitalization: No Has patient had a PCN reaction occurring within the last 10 years: No   . Vicodin [Hydrocodone-Acetaminophen] Other (See Comments)    Sped up heart and breathing    Review of Systems   Weight stable No fever  BP 140/70 (BP Location: Right Arm, Patient  Position: Sitting, Cuff Size: Normal)   Pulse 63   Resp 18   Ht 5' 4.5" (1.638 m)   Wt 126 lb (57.2 kg)   SpO2 97% Comment: RA  BMI 21.29 kg/m  Physical Exam Lungs clear bilaterally Heart rate regular Pleurx incision clean and dry sutures removed in office today  Diagnostic Tests: Chest x-ray 8 days ago shows Pleurx catheter in good position.  Some atelectasis of the right lower lobe from tumor obstruction  Impression: Pleurx catheter functioning well  Plan: Continue drainage sessions Monday Wednesday Friday Return for follow-up visit in 1 month with chest x-ray.  Len Childs, MD [facility] [phone]

## 2018-01-17 DIAGNOSIS — C3492 Malignant neoplasm of unspecified part of left bronchus or lung: Secondary | ICD-10-CM | POA: Diagnosis not present

## 2018-01-18 ENCOUNTER — Telehealth: Payer: Self-pay | Admitting: Medical Oncology

## 2018-01-18 DIAGNOSIS — Z4682 Encounter for fitting and adjustment of non-vascular catheter: Secondary | ICD-10-CM | POA: Diagnosis not present

## 2018-01-18 DIAGNOSIS — R7303 Prediabetes: Secondary | ICD-10-CM | POA: Diagnosis not present

## 2018-01-18 DIAGNOSIS — M1990 Unspecified osteoarthritis, unspecified site: Secondary | ICD-10-CM | POA: Diagnosis not present

## 2018-01-18 DIAGNOSIS — J910 Malignant pleural effusion: Secondary | ICD-10-CM | POA: Diagnosis not present

## 2018-01-18 DIAGNOSIS — R69 Illness, unspecified: Secondary | ICD-10-CM | POA: Diagnosis not present

## 2018-01-18 DIAGNOSIS — I7300 Raynaud's syndrome without gangrene: Secondary | ICD-10-CM | POA: Diagnosis not present

## 2018-01-18 DIAGNOSIS — I10 Essential (primary) hypertension: Secondary | ICD-10-CM | POA: Diagnosis not present

## 2018-01-18 DIAGNOSIS — C3491 Malignant neoplasm of unspecified part of right bronchus or lung: Secondary | ICD-10-CM | POA: Diagnosis not present

## 2018-01-18 DIAGNOSIS — J45909 Unspecified asthma, uncomplicated: Secondary | ICD-10-CM | POA: Diagnosis not present

## 2018-01-18 NOTE — Telephone Encounter (Signed)
Pt EGFR positive- Allison [name] prescribed Tagrisso. Appt 8/6 at 1330 with [facility].

## 2018-01-21 ENCOUNTER — Telehealth: Payer: Self-pay | Admitting: Internal Medicine

## 2018-01-21 ENCOUNTER — Telehealth: Payer: Self-pay | Admitting: Pharmacist

## 2018-01-21 DIAGNOSIS — M1990 Unspecified osteoarthritis, unspecified site: Secondary | ICD-10-CM | POA: Diagnosis not present

## 2018-01-21 DIAGNOSIS — I7300 Raynaud's syndrome without gangrene: Secondary | ICD-10-CM | POA: Diagnosis not present

## 2018-01-21 DIAGNOSIS — Z4682 Encounter for fitting and adjustment of non-vascular catheter: Secondary | ICD-10-CM | POA: Diagnosis not present

## 2018-01-21 DIAGNOSIS — J45909 Unspecified asthma, uncomplicated: Secondary | ICD-10-CM | POA: Diagnosis not present

## 2018-01-21 DIAGNOSIS — J910 Malignant pleural effusion: Secondary | ICD-10-CM | POA: Diagnosis not present

## 2018-01-21 DIAGNOSIS — C3491 Malignant neoplasm of unspecified part of right bronchus or lung: Secondary | ICD-10-CM

## 2018-01-21 DIAGNOSIS — I10 Essential (primary) hypertension: Secondary | ICD-10-CM | POA: Diagnosis not present

## 2018-01-21 DIAGNOSIS — R69 Illness, unspecified: Secondary | ICD-10-CM | POA: Diagnosis not present

## 2018-01-21 DIAGNOSIS — R7303 Prediabetes: Secondary | ICD-10-CM | POA: Diagnosis not present

## 2018-01-21 NOTE — Telephone Encounter (Signed)
Oral Oncology Pharmacist [employer] authorization has been approved by [employer] Part D for [place] Ref # [facility] Effective dates: 06/17/2017-06/18/2018  Johny Drilling, PharmD, BCPS, BCOP  01/21/2018 1:53 PM Oral Oncology Clinic [phone]

## 2018-01-21 NOTE — Telephone Encounter (Signed)
Oral Oncology Pharmacist Encounter  Insurance authorization required for Tagrisso Key: [facility] Status is pending  Johny Drilling, PharmD, BCPS, BCOP  01/21/2018 1:21 PM Oral Oncology Clinic [phone]

## 2018-01-21 NOTE — Telephone Encounter (Signed)
Scheduled appt per 8/2 sch message  - pt is aware of appt date and time.

## 2018-01-21 NOTE — Telephone Encounter (Signed)
Oral Oncology Pharmacist Encounter  Received new referral for Tagrisso (osimertinib) for the treatment of newly diagnosed, metastatic non small cell lung cancer, EGFR mutation positive (exon 19 deletion), planned duration until disease progression or unacceptable toxicity.  Labs from Epic assessed, [place] for treatment. 01/01/2018 EKG shows QTc 417 msec, OK for Tagrisso initiation  Current medication list in Epic reviewed, no DDIs with Tagrisso identified.  Prescription will be sent to appropriate specialty pharmacy for dispensing once received by MD after discussion with patient at office visit on 01/22/2018.  Insurance authorization will be submitted prior to 8/6 OV.  Oral Oncology Clinic will continue to follow for insurance authorization, copayment issues, initial counseling and start date.  Johny Drilling, PharmD, BCPS, BCOP  01/21/2018 1:03 PM Oral Oncology Clinic [phone]

## 2018-01-22 ENCOUNTER — Encounter: Payer: Self-pay | Admitting: Internal Medicine

## 2018-01-22 ENCOUNTER — Telehealth: Payer: Self-pay | Admitting: Pharmacist

## 2018-01-22 ENCOUNTER — Telehealth: Payer: Self-pay | Admitting: Internal Medicine

## 2018-01-22 ENCOUNTER — Inpatient Hospital Stay: Payer: Medicare HMO | Attending: Internal Medicine | Admitting: Internal Medicine

## 2018-01-22 VITALS — BP 147/84 | HR 64 | Temp 98.3°F | Resp 18 | Ht 64.5 in | Wt 130.2 lb

## 2018-01-22 DIAGNOSIS — C3411 Malignant neoplasm of upper lobe, right bronchus or lung: Secondary | ICD-10-CM | POA: Insufficient documentation

## 2018-01-22 DIAGNOSIS — Z79899 Other long term (current) drug therapy: Secondary | ICD-10-CM | POA: Diagnosis not present

## 2018-01-22 DIAGNOSIS — J910 Malignant pleural effusion: Secondary | ICD-10-CM | POA: Insufficient documentation

## 2018-01-22 DIAGNOSIS — Z7189 Other specified counseling: Secondary | ICD-10-CM

## 2018-01-22 DIAGNOSIS — C3491 Malignant neoplasm of unspecified part of right bronchus or lung: Secondary | ICD-10-CM

## 2018-01-22 DIAGNOSIS — Z5111 Encounter for antineoplastic chemotherapy: Secondary | ICD-10-CM

## 2018-01-22 MED ORDER — OSIMERTINIB MESYLATE 80 MG PO TABS: 80.0000 mg | ORAL_TABLET | Freq: Every day | ORAL | 0 refills | Status: DC

## 2018-01-22 NOTE — Telephone Encounter (Signed)
Oral Oncology Pharmacist Encounter  [place] in an effort to receive Tagrisso at $0 out-of-pocket cost to patient through compassionate use program faxed to manufacture at [phone].  This encounter will continue to be updated until final determination.  Johny Drilling, PharmD, BCPS, BCOP  01/22/2018 3:22 PM Oral Oncology Clinic [phone]

## 2018-01-22 NOTE — Telephone Encounter (Signed)
Appointments scheduled AVS/Calendar printed per 8/6 los

## 2018-01-22 NOTE — Progress Notes (Signed)
[place] [phone]   [phone]  OFFICE PROGRESS NOTE  [name], [place] [address]  DIAGNOSIS: Stage IV (T2 a,N2, M1a) non-small cell lung cancer, adenocarcinoma diagnosed in July 2019 and presented with right upper lobe lung mass in addition to mediastinal lymphadenopathy as well as bilateral pulmonary nodules and malignant right pleural effusion.  Biomarker Findings Microsatellite status - Cannot Be Determined Tumor Mutational Burden - Cannot Be Determined Genomic Findings For a complete list of the genes assayed, please refer to the Appendix. EGFR exon 19 deletion ([id number]) TP53 Y220C 7 Disease relevant genes with no reportable alterations: KRAS, ALK, BRAF, MET, RET, ERBB2, ROS1   PRIOR THERAPY: Status post right Pleurx catheter placement by Dr. Prescott Gum for drainage of malignant right pleural effusion.  CURRENT THERAPY: Tagrisso 80 mg p.o. daily.  Expected to start in the next few days.  INTERVAL HISTORY: Allison [name] 67 y.o. female returns to the clinic today for follow-up visit accompanied by her daughter and her boyfriend.  The patient is feeling fine today with no specific complaints except for occasional pain with the drainage of the right pleural effusion via the Pleurx.  She takes ibuprofen at the time of the drainage.  She denied having any significant shortness of breath, cough or hemoptysis.  She denied having any fever or chills.  She has no nausea, vomiting, diarrhea or constipation.  The patient denied having any recent weight loss or night sweats.  She has no headache or visual changes.  Molecular studies were performed by [facility] 1 and it showed positive EGFR mutation with deletion in exon 19.  The patient is here today for evaluation and discussion of her treatment options based on these findings.  MEDICAL HISTORY: Past Medical History:  Diagnosis Date  . Anxiety   .  Arthritis   . Asthma    exercise induced  . Depression    PMH  . GERD (gastroesophageal reflux disease)   . Glaucoma   . Hypertension   . Malignant pleural effusion    right  . PONV (postoperative nausea and vomiting)   . Pre-diabetes   . Raynaud's disease   . Raynaud's disease     ALLERGIES:  is allergic to penicillins and vicodin [hydrocodone-acetaminophen].  MEDICATIONS:  Current Outpatient Medications  Medication Sig Dispense Refill  . albuterol (PROVENTIL HFA;VENTOLIN HFA) 108 (90 Base) MCG/ACT inhaler TAKE 2 PUFFS BY MOUTH EVERY 4 HOURS AS NEEDED wheezing/shortness of breath  3  . ALPRAZolam (XANAX) 0.25 MG tablet Take 0.53m by mouth once daily as needed for anxiety    . amitriptyline (ELAVIL) 10 MG tablet Take 10 mg by mouth at bedtime.    .Marland KitchenamLODipine (NORVASC) 5 MG tablet Take 5 mg by mouth daily.  3  . brimonidine (ALPHAGAN P) 0.1 % SOLN Instill 1 drop in both eye twice daily    . dorzolamide-timolol (COSOPT) 22.3-6.8 MG/ML ophthalmic solution Instill 1 drop in both eyes twice daily    . hydrochlorothiazide (HYDRODIURIL) 25 MG tablet Take 25 mg by mouth daily.  4  . latanoprost (XALATAN) 0.005 % ophthalmic solution Place 1 drop into both eyes at bedtime.     . Multiple Vitamin (MULTIVITAMIN WITH MINERALS) TABS tablet Take 1 tablet by mouth daily. Max 1    . OVER THE COUNTER MEDICATION Take 1 tablet by mouth daily. Lung Detox    . traMADol (ULTRAM) 50 MG tablet Take 50 mg  by mouth 2 (two) times daily as needed. for pain  0  . triamcinolone cream (KENALOG) 0.1 % Apply 1 application topically 2 (two) times daily. (Patient not taking: Reported on 01/07/2018) 30 g 0   No current facility-administered medications for this visit.     SURGICAL HISTORY:  Past Surgical History:  Procedure Laterality Date  . ABDOMINAL HYSTERECTOMY     partial  . CHEST TUBE INSERTION Right 01/01/2018   Procedure: INSERTION PLEURAL DRAINAGE CATHETER;  Surgeon: Ivin Poot, MD;  Location:  [place];  Service: Thoracic;  Laterality: Right;  . COLONOSCOPY    . DILATION AND CURETTAGE OF UTERUS    . EYE SURGERY     due to Glaucoma  . ROTATOR CUFF REPAIR    . TUBAL LIGATION    . WISDOM TOOTH EXTRACTION      REVIEW OF SYSTEMS:  Constitutional: negative Eyes: negative Ears, nose, mouth, throat, and face: negative Respiratory: positive for pleurisy/chest pain Cardiovascular: negative Gastrointestinal: negative Genitourinary:negative Integument/breast: negative Hematologic/lymphatic: negative Musculoskeletal:negative Neurological: negative Behavioral/Psych: negative Endocrine: negative Allergic/Immunologic: negative   PHYSICAL EXAMINATION: General appearance: alert, cooperative and no distress Head: Normocephalic, without obvious abnormality, atraumatic Neck: no adenopathy, no JVD, supple, symmetrical, trachea midline and thyroid not enlarged, symmetric, no tenderness/mass/nodules Lymph nodes: Cervical, supraclavicular, and axillary nodes normal. Resp: clear to auscultation bilaterally Back: symmetric, no curvature. ROM normal. No CVA tenderness. Cardio: regular rate and rhythm, S1, S2 normal, no murmur, click, rub or gallop GI: soft, non-tender; bowel sounds normal; no masses,  no organomegaly Extremities: extremities normal, atraumatic, no cyanosis or edema Neurologic: Alert and oriented X 3, normal strength and tone. Normal symmetric reflexes. Normal coordination and gait  ECOG PERFORMANCE STATUS: 1 - Symptomatic but completely ambulatory  Blood pressure (!) 147/84, pulse 64, temperature 98.3 F (36.8 C), temperature source Oral, resp. rate 18, height 5' 4.5" (1.638 m), weight 130 lb 3.2 oz (59.1 kg), SpO2 100 %.  LABORATORY DATA: Lab Results  Component Value Date   WBC 5.2 01/07/2018   HGB 12.9 01/07/2018   HCT 39.3 01/07/2018   MCV 83.3 01/07/2018   PLT 323 01/07/2018      Chemistry      Component Value Date/Time   NA 138 01/07/2018 1159   K 4.0  01/07/2018 1159   CL 102 01/07/2018 1159   CO2 26 01/07/2018 1159   BUN 16 01/07/2018 1159   CREATININE 0.75 01/07/2018 1159   CREATININE 0.86 01/03/2018 1400      Component Value Date/Time   CALCIUM 9.3 01/07/2018 1159   ALKPHOS 55 01/03/2018 1400   AST 21 01/03/2018 1400   ALT 19 01/03/2018 1400   BILITOT 0.2 (L) 01/03/2018 1400       RADIOGRAPHIC STUDIES: Dg Chest 2 View  Result Date: 01/07/2018 CLINICAL DATA:  Dizziness and near syncope following thoracentesis. EXAM: CHEST - 2 VIEW COMPARISON:  01/01/2018 FINDINGS: Cardiomediastinal silhouette is unchanged. A RIGHT pleural catheter is again noted. There is no evidence of pneumothorax. A small to moderate RIGHT pleural effusion is again identified with RIGHT basilar atelectasis. Pulmonary vascular congestion is noted. RIGHT UPPER lung mass again noted. IMPRESSION: Increased pulmonary vascular congestion without other significant change. Small to moderate RIGHT pleural effusion-no pneumothorax. RIGHT pleural catheter remains in place. Electronically Signed   By: Margarette Canada M.D.   On: 01/07/2018 12:20   Dg Chest 2 View  Result Date: 01/01/2018 CLINICAL DATA:  Preoperative evaluation for placement of a pleural drainage catheter, history of malignant  RIGHT pleural effusion, asthma, hypertension EXAM: CHEST - 2 VIEW COMPARISON:  12/20/2014 FINDINGS: Upper normal heart size. Mediastinal contours and pulmonary vascularity normal. Atherosclerotic calcification aortic arch. Moderate RIGHT pleural effusion and basilar atelectasis, slightly increased. Opacities at the RIGHT upper lobe are unchanged, corresponding to tumor seen by prior CT. LEFT lung clear. No pneumothorax or acute osseous findings. IMPRESSION: Known RIGHT upper lobe mass. Increased RIGHT pleural effusion and basilar atelectasis. Electronically Signed   By: Lavonia Dana M.D.   On: 01/01/2018 12:58   Mr Jeri Cos UJ Contrast  Result Date: 01/10/2018 CLINICAL DATA:  Non-small cell  lung cancer staging. EXAM: MRI HEAD WITHOUT AND WITH CONTRAST TECHNIQUE: Multiplanar, multiecho pulse sequences of the brain and surrounding structures were obtained without and with intravenous contrast. CONTRAST:  108m MULTIHANCE GADOBENATE DIMEGLUMINE 529 MG/ML IV SOLN COMPARISON:  None. FINDINGS: Brain: There is no evidence of acute infarct, intracranial hemorrhage, midline shift, or extra-axial fluid collection. The ventricles and sulci are normal. Patchy T2 hyperintensities in the subcortical and deep cerebral white matter nonspecific but compatible with moderate chronic small vessel ischemic disease. A 6 mm enhancing extra-axial mass projects posteriorly from the right temporal bone in the posterior fossa without cerebellar mass effect or edema. No abnormal brain parenchymal or leptomeningeal enhancement is identified. Vascular: Major intracranial vascular flow voids are preserved. Skull and upper cervical spine: Unremarkable bone marrow signal. Sinuses/Orbits: Unremarkable orbits. Small left maxillary sinus mucous retention cyst. Clear mastoid air cells. Other: None. IMPRESSION: 1. No evidence of intracranial metastases. 2. 6 mm extra-axial mass in the posterior fossa most compatible with an incidental meningioma. 3. Moderate chronic small vessel ischemic disease. Electronically Signed   By: ALogan BoresM.D.   On: 01/10/2018 15:51   Nm Pet Image Initial (pi) Skull Base To Thigh  Result Date: 12/26/2017 CLINICAL DATA:  Initial treatment strategy for newly diagnosed right lung adenocarcinoma with malignant right pleural effusion as proven by thoracentesis. EXAM: NUCLEAR MEDICINE PET SKULL BASE TO THIGH TECHNIQUE: 6.2 mCi F-18 FDG was injected intravenously. Full-ring PET imaging was performed from the skull base to thigh after the radiotracer. CT data was obtained and used for attenuation correction and anatomic localization. Fasting blood glucose: 87 mg/dl COMPARISON:  12/11/2017 chest CT. FINDINGS:  Mediastinal blood pool activity: SUV max 2.4 NECK: No hypermetabolic lymph nodes in the neck. Incidental CT findings: none CHEST: Hypermetabolic spiculated 3.3 x 2.6 cm apical right upper lobe lung mass with max SUV 10.1 (series 8/image 18). Numerous scattered solid pulmonary nodules in both lungs (> 10), several of which are pleural-based in the right lung, largest a hypermetabolic 1.6 cm anterior right lower lobe nodule associated with the major fissure with max SUV 3.3 (series 8/image 41). The remaining nodules are subcentimeter and below PET resolution. Moderate dependent right pleural effusion with mild smooth right pleural thickening. No left pleural effusion. Mildly hypermetabolic right hilar node with max SUV 3.4. Mildly hypermetabolic right paratracheal 1.1 cm node with max SUV 4.8 (series 4/image 63). No additional hypermetabolic thoracic nodes. Incidental CT findings: Atherosclerotic nonaneurysmal thoracic aorta. ABDOMEN/PELVIS: No abnormal hypermetabolic activity within the liver, pancreas, adrenal glands, or spleen. No hypermetabolic lymph nodes in the abdomen or pelvis. Incidental CT findings: Hysterectomy. Trace free fluid in the pelvis, nonspecific. Atherosclerotic nonaneurysmal abdominal aorta. SKELETON: No focal hypermetabolic activity to suggest skeletal metastasis. Mild hypermetabolism at the right greater left sternoclavicular joints correlating with mild degenerative changes, favored to be degenerative in etiology. Incidental CT findings: No hypermetabolism at mixed  lytic and sclerotic T9 vertebral lesion described on the recent chest CT, which is compatible with a vertebral hemangioma. IMPRESSION: 1. Hypermetabolic spiculated 3.3 cm apical right upper lobe lung mass, compatible with primary bronchogenic adenocarcinoma. 2. Hypermetabolic right hilar and right paratracheal lymphadenopathy. 3. Moderate dependent right pleural effusion with mild right pleural thickening, compatible with known  malignant right pleural effusion. 4. Hypermetabolic 1.6 cm anterior right lower lobe nodule associated with the major fissure, compatible with pulmonary metastasis. Numerous additional subcentimeter pulmonary nodules scattered in both lungs, below PET resolution, suspicious for pulmonary metastases. Recommend attention on follow-up chest CT in 3 months. 5. No extrathoracic or osseous hypermetabolic metastatic disease. 6.  Aortic Atherosclerosis (ICD10-I70.0). Electronically Signed   By: Ilona Sorrel M.D.   On: 12/26/2017 15:17   Dg Chest Port 1 View  Result Date: 01/01/2018 CLINICAL DATA:  Post insertion of pleural drainage catheter on the right EXAM: PORTABLE CHEST 1 VIEW COMPARISON:  Chest x-ray of 01/01/2018 FINDINGS: A pleural drainage catheter has been inserted with the tip at the right lung apex. Medial right apical lung mass is again noted. No pneumothorax is seen. The amount of fluid has diminished somewhat in the interval. The left lung is clear. Heart size is stable. IMPRESSION: 1. New pleural catheter inserted with the tip near the apex of the right lung. 2. Decrease in volume of right pleural effusion. 3. No change in medial right upper lobe lung mass. Electronically Signed   By: Ivar Drape M.D.   On: 01/01/2018 16:03   Dg Fluoro Guide Cv Line-no Report  Result Date: 01/01/2018 Fluoroscopy was utilized by the requesting physician.  No radiographic interpretation.    ASSESSMENT AND PLAN: This is a very pleasant 68 years old never smoker African-American female recently with a stage IV non-small cell lung cancer, adenocarcinoma with positive EGFR mutation with deletion in exon 19. I had a lengthy discussion with the patient and her family today about her current disease stage, prognosis and treatment options. I discussed the molecular studies with the patient and recommended for her treatment with EGFR tyrosine kinase inhibitor.  I recommended for the patient treatment with Tagrisso 80 mg  p.o. daily. I discussed with the patient the adverse effect of this treatment including but not limited to skin rash, diarrhea, interstitial lung disease, liver, renal or cardiac dysfunction. We will check baseline EKG today. The patient will see the pharmacist for oral medication to help her with getting her medication as soon as possible.  She will also provide the patient with handout and information about Tagrisso. She is expected to start this treatment in the next few days. I will see the patient back for follow-up visit in 2-3 weeks for evaluation and management of any adverse effect of her treatment as well as repeat blood work. For the recurrent right pleural effusion, the patient will continue the drainage of the Pleurx catheter 3 times a week for now. The patient was advised to call immediately if she has any concerning symptoms in the interval. The patient voices understanding of current disease status and treatment options and is in agreement with the current care plan.  All questions were answered. The patient knows to call the clinic with any problems, questions or concerns. We can certainly see the patient much sooner if necessary.  I spent 20 minutes counseling the patient face to face. The total time spent in the appointment was 30 minutes.  Disclaimer: This note was dictated with voice recognition software. Similar  sounding words can inadvertently be transcribed and may not be corrected upon review.

## 2018-01-22 NOTE — Progress Notes (Signed)
START ON PATHWAY REGIMEN - Non-Small Cell Lung     A cycle is every 28 days:     Osimertinib   **Always confirm dose/schedule in your pharmacy ordering system**  Patient Characteristics: Stage IV Metastatic, Nonsquamous, Molecular Targeted Therapy, Initial Molecular Targeted Therapy, EGFR Sensitizing Mutation - Common AJCC T Category: T2a Current Disease Status: Distant Metastases AJCC N Category: N2 AJCC M Category: M1a AJCC 8 Stage Grouping: IVA Histology: Nonsquamous Cell ROS1 Rearrangement Status: Negative T790M Mutation Status: Not Applicable - EGFR Mutation 1st Line of Therapy Other Mutations/Biomarkers: No Other Actionable Mutations NTRK Gene Fusion Status: Negative PD-L1 Expression Status: PD-L1 Positive 1-49% (TPS) Chemotherapy/Immunotherapy LOT: Not Appropriate Molecular Targeted Therapy: Initial Molecular Targeted Therapy ALK Translocation Status: Negative EGFR Mutation Status: Sensitizing Common BRAF V600E Mutation Status: Negative Intent of Therapy: Non-Curative / Palliative Intent, Discussed with Patient

## 2018-01-22 NOTE — Telephone Encounter (Signed)
Oral Chemotherapy Pharmacist Encounter   I spoke with patient, daughter, significant other, and another daughter on the phone, in exam room for overview of: Tagrisso.   Counseled patient on administration, dosing, side effects, monitoring, drug-food interactions, safe handling, storage, and disposal.  Patient will take Tagrisso 80 tablets, 1 tablet by mouth once daily, without regard to food.  Tagrisso start date: TBD, pending medication acquisition  Adverse effects include but are not limited to: diarrhea, mouth sores, decreased appetitie, fatigue, dry skin, rash, nail changes, altered cardiac conduction, and decreased blood counts or electrolytes.  Patient will obtain anti diarrheal and alert the office of 4 or more loose stools above baseline.   We extensively discussed restarting patient's hydrochlorothiazide. She is agreeable to restart. Hydrochlorothiazide will be discontinued with hypotension or repeat vasovagal response to catheter drainage.  Reviewed with patient importance of keeping a medication schedule and plan for any missed doses.  Ms. Wiltsie voiced understanding and appreciation.   All questions answered. Medication reconciliation performed and medication/allergy list updated.  Insurance authorization for Newman Nip has been obtained. Test claim at the pharmacy revealed copayment $2387.64 This is prohibitively expensive There are no copayment [employer] foundations available for patient's diagnosis at this time  Oral oncology clinic will work with patient to complete manufacturer assistance application in an effort to obtain the Tagrisso at $0 out-of-pocket cost to patient through compassionate use program. This application will be updated in a separate encounter.  Patient knows to call the office with questions or concerns. Oral Oncology Clinic will continue to follow.  Thank you,  Johny Drilling, PharmD, BCPS, BCOP  01/22/2018   3:10 PM Oral Oncology  Clinic [phone]

## 2018-01-24 ENCOUNTER — Ambulatory Visit: Payer: Medicare HMO | Admitting: Oncology

## 2018-01-25 DIAGNOSIS — I10 Essential (primary) hypertension: Secondary | ICD-10-CM | POA: Diagnosis not present

## 2018-01-25 DIAGNOSIS — Z4682 Encounter for fitting and adjustment of non-vascular catheter: Secondary | ICD-10-CM | POA: Diagnosis not present

## 2018-01-25 DIAGNOSIS — M1990 Unspecified osteoarthritis, unspecified site: Secondary | ICD-10-CM | POA: Diagnosis not present

## 2018-01-25 DIAGNOSIS — R69 Illness, unspecified: Secondary | ICD-10-CM | POA: Diagnosis not present

## 2018-01-25 DIAGNOSIS — I7300 Raynaud's syndrome without gangrene: Secondary | ICD-10-CM | POA: Diagnosis not present

## 2018-01-25 DIAGNOSIS — J910 Malignant pleural effusion: Secondary | ICD-10-CM | POA: Diagnosis not present

## 2018-01-25 DIAGNOSIS — R7303 Prediabetes: Secondary | ICD-10-CM | POA: Diagnosis not present

## 2018-01-25 DIAGNOSIS — J45909 Unspecified asthma, uncomplicated: Secondary | ICD-10-CM | POA: Diagnosis not present

## 2018-01-25 DIAGNOSIS — C3491 Malignant neoplasm of unspecified part of right bronchus or lung: Secondary | ICD-10-CM | POA: Diagnosis not present

## 2018-01-28 ENCOUNTER — Encounter (HOSPITAL_COMMUNITY): Payer: Self-pay | Admitting: Internal Medicine

## 2018-01-28 NOTE — Telephone Encounter (Signed)
Oral Oncology Patient Advocate Encounter  [place] Tagrisso until 01/26/19.   I spoke to the patient and explained the approval, gave her the number to call to get her refills and let her know that the medicine will ship in 3-5 business days.  Patient verbalized understanding and appreciation.  [facility] [place] [facility] Phone [phone] Fax [phone]

## 2018-01-30 NOTE — Telephone Encounter (Signed)
Oral Oncology Pharmacist Encounter  Received message from patient that she has received initial shipment of her Tagrisso 80 mg tablets from the [place] prescription savings program.  Tagrisso start date: 01/29/2018  Johny Drilling, PharmD, BCPS, BCOP  01/30/2018 11:26 AM Oral Oncology Clinic [phone]

## 2018-02-01 DIAGNOSIS — I7300 Raynaud's syndrome without gangrene: Secondary | ICD-10-CM | POA: Diagnosis not present

## 2018-02-01 DIAGNOSIS — J45909 Unspecified asthma, uncomplicated: Secondary | ICD-10-CM | POA: Diagnosis not present

## 2018-02-01 DIAGNOSIS — R7303 Prediabetes: Secondary | ICD-10-CM | POA: Diagnosis not present

## 2018-02-01 DIAGNOSIS — I10 Essential (primary) hypertension: Secondary | ICD-10-CM | POA: Diagnosis not present

## 2018-02-01 DIAGNOSIS — Z4682 Encounter for fitting and adjustment of non-vascular catheter: Secondary | ICD-10-CM | POA: Diagnosis not present

## 2018-02-01 DIAGNOSIS — C3491 Malignant neoplasm of unspecified part of right bronchus or lung: Secondary | ICD-10-CM | POA: Diagnosis not present

## 2018-02-01 DIAGNOSIS — J910 Malignant pleural effusion: Secondary | ICD-10-CM | POA: Diagnosis not present

## 2018-02-01 DIAGNOSIS — R69 Illness, unspecified: Secondary | ICD-10-CM | POA: Diagnosis not present

## 2018-02-01 DIAGNOSIS — M1990 Unspecified osteoarthritis, unspecified site: Secondary | ICD-10-CM | POA: Diagnosis not present

## 2018-02-08 DIAGNOSIS — M1990 Unspecified osteoarthritis, unspecified site: Secondary | ICD-10-CM | POA: Diagnosis not present

## 2018-02-08 DIAGNOSIS — R69 Illness, unspecified: Secondary | ICD-10-CM | POA: Diagnosis not present

## 2018-02-08 DIAGNOSIS — I7300 Raynaud's syndrome without gangrene: Secondary | ICD-10-CM | POA: Diagnosis not present

## 2018-02-08 DIAGNOSIS — C3491 Malignant neoplasm of unspecified part of right bronchus or lung: Secondary | ICD-10-CM | POA: Diagnosis not present

## 2018-02-08 DIAGNOSIS — R7303 Prediabetes: Secondary | ICD-10-CM | POA: Diagnosis not present

## 2018-02-08 DIAGNOSIS — J910 Malignant pleural effusion: Secondary | ICD-10-CM | POA: Diagnosis not present

## 2018-02-08 DIAGNOSIS — Z4682 Encounter for fitting and adjustment of non-vascular catheter: Secondary | ICD-10-CM | POA: Diagnosis not present

## 2018-02-08 DIAGNOSIS — I10 Essential (primary) hypertension: Secondary | ICD-10-CM | POA: Diagnosis not present

## 2018-02-08 DIAGNOSIS — J45909 Unspecified asthma, uncomplicated: Secondary | ICD-10-CM | POA: Diagnosis not present

## 2018-02-11 ENCOUNTER — Other Ambulatory Visit: Payer: Self-pay | Admitting: Cardiothoracic Surgery

## 2018-02-11 DIAGNOSIS — R918 Other nonspecific abnormal finding of lung field: Secondary | ICD-10-CM

## 2018-02-12 ENCOUNTER — Encounter: Payer: Self-pay | Admitting: *Deleted

## 2018-02-12 ENCOUNTER — Other Ambulatory Visit: Payer: Self-pay

## 2018-02-12 ENCOUNTER — Encounter: Payer: Self-pay | Admitting: Oncology

## 2018-02-12 ENCOUNTER — Inpatient Hospital Stay (HOSPITAL_BASED_OUTPATIENT_CLINIC_OR_DEPARTMENT_OTHER): Payer: Medicare HMO | Admitting: Oncology

## 2018-02-12 ENCOUNTER — Inpatient Hospital Stay: Payer: Medicare HMO

## 2018-02-12 VITALS — BP 130/72 | HR 64 | Temp 98.5°F | Resp 18 | Ht 64.5 in | Wt 130.2 lb

## 2018-02-12 DIAGNOSIS — C3411 Malignant neoplasm of upper lobe, right bronchus or lung: Secondary | ICD-10-CM | POA: Diagnosis not present

## 2018-02-12 DIAGNOSIS — J910 Malignant pleural effusion: Secondary | ICD-10-CM

## 2018-02-12 DIAGNOSIS — Z5111 Encounter for antineoplastic chemotherapy: Secondary | ICD-10-CM

## 2018-02-12 DIAGNOSIS — C3491 Malignant neoplasm of unspecified part of right bronchus or lung: Secondary | ICD-10-CM

## 2018-02-12 DIAGNOSIS — Z79899 Other long term (current) drug therapy: Secondary | ICD-10-CM | POA: Diagnosis not present

## 2018-02-12 LAB — CBC WITH DIFFERENTIAL (CANCER CENTER ONLY)
Basophils Absolute: 0.1 10*3/uL (ref 0.0–0.1)
Basophils Relative: 1 %
Eosinophils Absolute: 0.1 10*3/uL (ref 0.0–0.5)
Eosinophils Relative: 2 %
HCT: 40 % (ref 34.8–46.6)
Hemoglobin: 12.8 g/dL (ref 11.6–15.9)
Lymphocytes Relative: 30 %
Lymphs Abs: 1.4 10*3/uL (ref 0.9–3.3)
MCH: 26.7 pg (ref 25.1–34.0)
MCHC: 32 g/dL (ref 31.5–36.0)
MCV: 83.5 fL (ref 79.5–101.0)
Monocytes Absolute: 0.4 10*3/uL (ref 0.1–0.9)
Monocytes Relative: 8 %
Neutro Abs: 2.7 10*3/uL (ref 1.5–6.5)
Neutrophils Relative %: 59 %
Platelet Count: 211 10*3/uL (ref 145–400)
RBC: 4.79 MIL/uL (ref 3.70–5.45)
RDW: 13.4 % (ref 11.2–14.5)
WBC Count: 4.7 10*3/uL (ref 3.9–10.3)

## 2018-02-12 LAB — CMP (CANCER CENTER ONLY)
ALT: 18 U/L (ref 0–44)
AST: 18 U/L (ref 15–41)
Albumin: 3.4 g/dL — ABNORMAL LOW (ref 3.5–5.0)
Alkaline Phosphatase: 50 U/L (ref 38–126)
Anion gap: 7 (ref 5–15)
BUN: 20 mg/dL (ref 8–23)
CO2: 29 mmol/L (ref 22–32)
Calcium: 9.7 mg/dL (ref 8.9–10.3)
Chloride: 107 mmol/L (ref 98–111)
Creatinine: 1.03 mg/dL — ABNORMAL HIGH (ref 0.44–1.00)
GFR, Est AFR Am: >60 mL/min (ref >60)
GFR, Estimated: 55 mL/min — ABNORMAL LOW (ref >60)
Glucose, Bld: 117 mg/dL — ABNORMAL HIGH (ref 70–99)
Potassium: 4.5 mmol/L (ref 3.5–5.1)
Sodium: 143 mmol/L (ref 135–145)
Total Bilirubin: <0.2 mg/dL — ABNORMAL LOW (ref 0.3–1.2)
Total Protein: 6.7 g/dL (ref 6.5–8.1)

## 2018-02-12 NOTE — Progress Notes (Signed)
[facility] OFFICE PROGRESS NOTE  [name], [place] [address]  DIAGNOSIS: Stage IV (T2 a,N2, M1a)non-small cell lung cancer, adenocarcinoma diagnosed in July 2019 and presented with right upper lobe lung mass in addition to mediastinal lymphadenopathy as well as bilateral pulmonary nodules and malignant right pleural effusion.  Biomarker Findings Microsatellite status - Cannot Be Determined Tumor Mutational Burden - Cannot Be Determined Genomic Findings For a complete list of the genes assayed, please refer to the Appendix. EGFR exon 19 deletion ([id number]) TP53 Y220C 7 Disease relevant genes with no reportable alterations: KRAS, ALK, BRAF, MET, RET, ERBB2, ROS1   PRIOR THERAPY: Status post right Pleurx catheter placement by Dr. Prescott Gum for drainage of malignant right pleural effusion.  CURRENT THERAPY: Tagrisso 80 mg p.o. daily.    First dose started on 01/29/2018.  INTERVAL HISTORY: Allison [name] 67 y.o. female returns for a routine follow-up visit accompanied by her boyfriend.  The patient recently started Tagrisso and has experienced some increase in her fatigue level.  She tries to stay active however.  She denies fevers and chills.  Denies chest pain, shortness of breath, cough, hemoptysis.  Denies nausea, vomiting, constipation, diarrhea.  Denies rashes.  The patient continues to drain her Pleurx catheter 3 times a week.  The drainage amount has ranged from 250 cc- 480 cc.  The patient is here for evaluation and repeat lab work.  MEDICAL HISTORY: Past Medical History:  Diagnosis Date  . Anxiety   . Arthritis   . Asthma    exercise induced  . Depression    PMH  . GERD (gastroesophageal reflux disease)   . Glaucoma   . Hypertension   . Malignant pleural effusion    right  . PONV (postoperative nausea and vomiting)   . Pre-diabetes   . Raynaud's disease   . Raynaud's disease     ALLERGIES:  is allergic  to penicillins and vicodin [hydrocodone-acetaminophen].  MEDICATIONS:  Current Outpatient Medications  Medication Sig Dispense Refill  . ALPRAZolam (XANAX) 0.25 MG tablet Take 0.39m by mouth once daily as needed for anxiety    . amLODipine (NORVASC) 5 MG tablet Take 5 mg by mouth daily.  3  . brimonidine (ALPHAGAN P) 0.1 % SOLN Instill 1 drop in both eye twice daily    . dorzolamide-timolol (COSOPT) 22.3-6.8 MG/ML ophthalmic solution Instill 1 drop in both eyes twice daily    . hydrochlorothiazide (HYDRODIURIL) 25 MG tablet Take 25 mg by mouth daily.  4  . latanoprost (XALATAN) 0.005 % ophthalmic solution Place 1 drop into both eyes at bedtime.     .Marland Kitchenosimertinib mesylate (TAGRISSO) 80 MG tablet Take 1 tablet (80 mg total) by mouth daily. 30 tablet 0  . albuterol (PROVENTIL HFA;VENTOLIN HFA) 108 (90 Base) MCG/ACT inhaler TAKE 2 PUFFS BY MOUTH EVERY 4 HOURS AS NEEDED wheezing/shortness of breath  3  . amitriptyline (ELAVIL) 10 MG tablet Take 10 mg by mouth at bedtime.    . traMADol (ULTRAM) 50 MG tablet Take 50 mg by mouth 2 (two) times daily as needed. for pain  0  . triamcinolone cream (KENALOG) 0.1 % Apply 1 application topically 2 (two) times daily. (Patient not taking: Reported on 02/12/2018) 30 g 0   No current facility-administered medications for this visit.     SURGICAL HISTORY:  Past Surgical History:  Procedure Laterality Date  . ABDOMINAL HYSTERECTOMY     partial  . CHEST TUBE INSERTION Right  01/01/2018   Procedure: INSERTION PLEURAL DRAINAGE CATHETER;  Surgeon: Ivin Poot, MD;  Location: [facility];  Service: Thoracic;  Laterality: Right;  . COLONOSCOPY    . DILATION AND CURETTAGE OF UTERUS    . EYE SURGERY     due to Glaucoma  . ROTATOR CUFF REPAIR    . TUBAL LIGATION    . WISDOM TOOTH EXTRACTION      REVIEW OF SYSTEMS:   Review of Systems  Constitutional: Negative for appetite change, chills, fever and unexpected weight change.  Positive for mild fatigue. HENT:    Negative for mouth sores, nosebleeds, sore throat and trouble swallowing.   Eyes: Negative for eye problems and icterus.  Respiratory: Negative for cough, hemoptysis, shortness of breath and wheezing.   Cardiovascular: Negative for chest pain and leg swelling.  Gastrointestinal: Negative for abdominal pain, constipation, diarrhea, nausea and vomiting.  Genitourinary: Negative for bladder incontinence, difficulty urinating, dysuria, frequency and hematuria.   Musculoskeletal: Negative for back pain, gait problem, neck pain and neck stiffness.  Skin: Negative for itching and rash.  Neurological: Negative for dizziness, extremity weakness, gait problem, headaches, light-headedness and seizures.  Hematological: Negative for adenopathy. Does not bruise/bleed easily.  Psychiatric/Behavioral: Negative for confusion, depression and sleep disturbance. The patient is not nervous/anxious.     PHYSICAL EXAMINATION:  Blood pressure 130/72, pulse 64, temperature 98.5 F (36.9 C), temperature source Oral, resp. rate 18, height 5' 4.5" (1.638 m), weight 130 lb 3.2 oz (59.1 kg), SpO2 100 %.  ECOG PERFORMANCE STATUS: 1 - Symptomatic but completely ambulatory  Physical Exam  Constitutional: Oriented to person, place, and time and well-developed, well-nourished, and in no distress. No distress.  HENT:  Head: Normocephalic and atraumatic.  Mouth/Throat: Oropharynx is clear and moist. No oropharyngeal exudate.  Eyes: Conjunctivae are normal. Right eye exhibits no discharge. Left eye exhibits no discharge. No scleral icterus.  Neck: Normal range of motion. Neck supple.  Cardiovascular: Normal rate, regular rhythm, normal heart sounds and intact distal pulses.   Pulmonary/Chest: Effort normal and breath sounds normal. No respiratory distress. No wheezes. No rales.  Abdominal: Soft. Bowel sounds are normal. Exhibits no distension and no mass. There is no tenderness.  Musculoskeletal: Normal range of motion.  Exhibits no edema.  Lymphadenopathy:    No cervical adenopathy.  Neurological: Alert and oriented to person, place, and time. Exhibits normal muscle tone. Gait normal. Coordination normal.  Skin: Skin is warm and dry. No rash noted. Not diaphoretic. No erythema. No pallor.  Psychiatric: Mood, memory and judgment normal.  Vitals reviewed.  LABORATORY DATA: Lab Results  Component Value Date   WBC 4.7 02/12/2018   HGB 12.8 02/12/2018   HCT 40.0 02/12/2018   MCV 83.5 02/12/2018   PLT 211 02/12/2018      Chemistry      Component Value Date/Time   NA 143 02/12/2018 1451   K 4.5 02/12/2018 1451   CL 107 02/12/2018 1451   CO2 29 02/12/2018 1451   BUN 20 02/12/2018 1451   CREATININE 1.03 (H) 02/12/2018 1451      Component Value Date/Time   CALCIUM 9.7 02/12/2018 1451   ALKPHOS 50 02/12/2018 1451   AST 18 02/12/2018 1451   ALT 18 02/12/2018 1451   BILITOT <0.2 (L) 02/12/2018 1451       RADIOGRAPHIC STUDIES:  No results found.   ASSESSMENT/PLAN:  Adenocarcinoma of right lung, stage 4 (HCC) This is a very pleasant 67 year old never smoker African-American female recently  with a stage IV non-small cell lung cancer, adenocarcinoma with positive EGFR mutation with deletion in exon 19. The patient is currently on treatment with Tagrisso 80 mg p.o. daily.  Status post 2 weeks of treatment.  She is tolerating her fatigue.  The patient was seen with Dr. [name].  Labs reviewed and are stable.  We again reviewed with the patient the mechanism of action of Tagrisso.  We also recommend the patient avoid herbal supplements but it is okay for her to take a multivitamin.  The patient will continue on the current dose of Tagrisso.  She will follow-up in 2 weeks for evaluation and repeat lab work.  For the recurrent right pleural effusion, the patient will continue the drainage of the Pleurx catheter 3 times a week for now.  She has a follow-up appointment scheduled with Dr. Prescott Gum  tomorrow.   The patient was advised to call immediately if she has any concerning symptoms in the interval. The patient voices understanding of current disease status and treatment options and is in agreement with the current care plan.  All questions were answered. The patient knows to call the clinic with any problems, questions or concerns. We can certainly see the patient much sooner if necessary.   Orders Placed This Encounter  Procedures  . CBC with Differential (Cancer Center Only)    Standing Status:   Future    Standing Expiration Date:   02/13/2019  . CMP ([facility] only)    Standing Status:   Future    Standing Expiration Date:   02/13/2019     Mikey Bussing, DNP, AGPCNP-BC, AOCNP 02/12/18   ADDENDUM: Hematology/Oncology Attending: I had a face-to-face encounter with the patient today.  I recommended her care plan.  This is a very pleasant 67 years old African-American female recently diagnosed with a stage IV non-small cell lung cancer, adenocarcinoma with positive EGFR mutation with deletion in exon 19.  The patient was a started on treatment with Tagrisso 80 mg p.o. daily and has been tolerating the first 2 weeks of her treatment fairly well.  She denied having any significant chest pain but has mild shortness of breath with exertion with no cough or hemoptysis.  She has no skin rash or diarrhea.  She had few questions today about her condition and the meaning of EGFR mutation as well as her treatment and I answered her questions to her satisfaction.  She will continue on Tagrisso 80 mg p.o. daily. We will see her back for follow-up visit in 2 weeks for evaluation and repeat CBC and comprehensive metabolic panel. She is followed by Dr. Prescott Gum for the pleural effusion. The patient was advised to call immediately if she has any concerning symptoms in the interval.  .Disclaimer: This note was dictated with voice recognition software. Similar sounding words can  inadvertently be transcribed and may be missed upon review. Eilleen Kempf, MD 02/12/18

## 2018-02-12 NOTE — Assessment & Plan Note (Addendum)
This is a very pleasant 67 year old never smoker African-American female recently with a stage IV non-small cell lung cancer, adenocarcinoma with positive EGFR mutation with deletion in exon 19. The patient is currently on treatment with Tagrisso 80 mg p.o. daily.  Status post 2 weeks of treatment.  She is tolerating her fatigue.  The patient was seen with Dr. [name].  Labs reviewed and are stable.  We again reviewed with the patient the mechanism of action of Tagrisso.  We also recommend the patient avoid herbal supplements but it is okay for her to take a multivitamin.  The patient will continue on the current dose of Tagrisso.  She will follow-up in 2 weeks for evaluation and repeat lab work.  For the recurrent right pleural effusion, the patient will continue the drainage of the Pleurx catheter 3 times a week for now.  She has a follow-up appointment scheduled with Dr. Prescott Gum tomorrow.   The patient was advised to call immediately if she has any concerning symptoms in the interval. The patient voices understanding of current disease status and treatment options and is in agreement with the current care plan.  All questions were answered. The patient knows to call the clinic with any problems, questions or concerns. We can certainly see the patient much sooner if necessary.

## 2018-02-13 ENCOUNTER — Telehealth: Payer: Self-pay | Admitting: Oncology

## 2018-02-13 ENCOUNTER — Encounter: Payer: Self-pay | Admitting: Cardiothoracic Surgery

## 2018-02-13 ENCOUNTER — Ambulatory Visit: Payer: Medicare HMO | Admitting: Cardiothoracic Surgery

## 2018-02-13 ENCOUNTER — Ambulatory Visit
Admission: RE | Admit: 2018-02-13 | Discharge: 2018-02-13 | Disposition: A | Discharge status: Home / Self Care | Payer: Medicare HMO | Source: Ambulatory Visit | Attending: Cardiothoracic Surgery | Admitting: Cardiothoracic Surgery

## 2018-02-13 ENCOUNTER — Other Ambulatory Visit: Payer: Self-pay

## 2018-02-13 VITALS — BP 150/80 | HR 64 | Resp 16 | Ht 64.5 in | Wt 130.0 lb

## 2018-02-13 DIAGNOSIS — J910 Malignant pleural effusion: Secondary | ICD-10-CM | POA: Diagnosis not present

## 2018-02-13 DIAGNOSIS — R0789 Other chest pain: Secondary | ICD-10-CM | POA: Diagnosis not present

## 2018-02-13 DIAGNOSIS — C3491 Malignant neoplasm of unspecified part of right bronchus or lung: Secondary | ICD-10-CM

## 2018-02-13 DIAGNOSIS — Z9689 Presence of other specified functional implants: Secondary | ICD-10-CM | POA: Diagnosis not present

## 2018-02-13 DIAGNOSIS — R918 Other nonspecific abnormal finding of lung field: Secondary | ICD-10-CM

## 2018-02-13 NOTE — Telephone Encounter (Signed)
Scheduled appt per 8/27 los - pt is aware of appt date and time.

## 2018-02-13 NOTE — Progress Notes (Signed)
PCP is [name], MD Referring Provider is [name], MD  Chief Complaint  Patient presents with  . Pleural Effusion    3 week f/u with CXR....assess R pleurX site irritation    HPI: 1 month follow-up for right Pleurx catheter for malignant right pleural effusion Volume removed is averaging 300-400 cc on a Monday Wednesday Friday schedule She is taking oral anti-tumor chemotherapy We removed the silk skin suture around the catheter today after draining 400 cc of clear yellow fluid and placing a new clean dressing.  I reviewed the chest x-ray taken today which shows the catheter in good position with mild right pleural effusion which was drained out in the office visit later.   Past Medical History:  Diagnosis Date  . Anxiety   . Arthritis   . Asthma    exercise induced  . Depression    PMH  . GERD (gastroesophageal reflux disease)   . Glaucoma   . Hypertension   . Malignant pleural effusion    right  . PONV (postoperative nausea and vomiting)   . Pre-diabetes   . Raynaud's disease   . Raynaud's disease     Past Surgical History:  Procedure Laterality Date  . ABDOMINAL HYSTERECTOMY     partial  . CHEST TUBE INSERTION Right 01/01/2018   Procedure: INSERTION PLEURAL DRAINAGE CATHETER;  Surgeon: Ivin Poot, MD;  Location: [facility];  Service: Thoracic;  Laterality: Right;  . COLONOSCOPY    . DILATION AND CURETTAGE OF UTERUS    . EYE SURGERY     due to Glaucoma  . ROTATOR CUFF REPAIR    . TUBAL LIGATION    . WISDOM TOOTH EXTRACTION      Family History  Problem Relation Age of Onset  . Heart disease Sister   . Heart disease Brother   . Lung cancer Other     Social History Social History   Tobacco Use  . Smoking status: Never Smoker  . Smokeless tobacco: Never Used  Substance Use Topics  . Alcohol use: Yes    Comment: up to 3 drinks per week  . Drug use: No    Comment: CBD oil     Current Outpatient Medications  Medication Sig  Dispense Refill  . ALPRAZolam (XANAX) 0.25 MG tablet Take 0.25mg  by mouth once daily as needed for anxiety    . amLODipine (NORVASC) 5 MG tablet Take 5 mg by mouth daily.  3  . brimonidine (ALPHAGAN P) 0.1 % SOLN Instill 1 drop in both eye twice daily    . dorzolamide-timolol (COSOPT) 22.3-6.8 MG/ML ophthalmic solution Instill 1 drop in both eyes twice daily    . hydrochlorothiazide (HYDRODIURIL) 25 MG tablet Take 25 mg by mouth daily.  4  . latanoprost (XALATAN) 0.005 % ophthalmic solution Place 1 drop into both eyes at bedtime.     . naproxen sodium (ALEVE) 220 MG tablet Take 220 mg by mouth 2 (two) times daily as needed.    Marland Kitchen osimertinib mesylate (TAGRISSO) 80 MG tablet Take 1 tablet (80 mg total) by mouth daily. 30 tablet 0  . traMADol (ULTRAM) 50 MG tablet Take 50 mg by mouth 2 (two) times daily as needed. for pain  0  . triamcinolone cream (KENALOG) 0.1 % Apply 1 application topically 2 (two) times daily. 30 g 0   No current facility-administered medications for this visit.     Allergies  Allergen Reactions  . Penicillins Other (See Comments)    [facility] PATIENT  HAS HAD A PCN REACTION WITH IMMEDIATE RASH, FACIAL/TONGUE/THROAT SWELLING, SOB, OR LIGHTHEADEDNESS WITH HYPOTENSION:  #  #  YES  #  # Has patient had a PCN reaction causing severe rash involving mucus membranes or skin necrosis: No Has patient had a PCN reaction that required hospitalization: No Has patient had a PCN reaction occurring within the last 10 years: No   . Vicodin [Hydrocodone-Acetaminophen] Other (See Comments)    Sped up heart and breathing    Review of Systems  No fever Some fatigue from the chemotherapy Some pain at the catheter site but no drainage  BP (!) 150/80 (BP Location: Right Arm, Patient Position: Sitting, Cuff Size: Normal)   Pulse 64   Resp 16   Ht 5' 4.5" (1.638 m)   Wt 130 lb (59 kg)   BMI 21.97 kg/m  Physical Exam      Exam    General- alert and comfortable    Neck- no JVD, no  cervical adenopathy palpable, no carotid bruit   Lungs- clear without rales, wheezes   Cor- regular rate and rhythm, no murmur , gallop   Abdomen- soft, non-tender   Extremities - warm, non-tender, minimal edema   Neuro- oriented, appropriate, no focal weakness Pleurx catheter well incorporated into the skin without evidence of infection   Diagnostic Tests: Chest x-ray personally reviewed as above  Impression: Right Pleurx catheter working well.  No shortness of breath with drainage of right pleural effusion.  Plan: Return in 1 month with chest x-ray   Len Childs, MD [facility] [phone]

## 2018-02-15 DIAGNOSIS — C3491 Malignant neoplasm of unspecified part of right bronchus or lung: Secondary | ICD-10-CM | POA: Diagnosis not present

## 2018-02-15 DIAGNOSIS — I10 Essential (primary) hypertension: Secondary | ICD-10-CM | POA: Diagnosis not present

## 2018-02-15 DIAGNOSIS — R7303 Prediabetes: Secondary | ICD-10-CM | POA: Diagnosis not present

## 2018-02-15 DIAGNOSIS — J910 Malignant pleural effusion: Secondary | ICD-10-CM | POA: Diagnosis not present

## 2018-02-15 DIAGNOSIS — Z4682 Encounter for fitting and adjustment of non-vascular catheter: Secondary | ICD-10-CM | POA: Diagnosis not present

## 2018-02-15 DIAGNOSIS — M1990 Unspecified osteoarthritis, unspecified site: Secondary | ICD-10-CM | POA: Diagnosis not present

## 2018-02-15 DIAGNOSIS — R69 Illness, unspecified: Secondary | ICD-10-CM | POA: Diagnosis not present

## 2018-02-15 DIAGNOSIS — I7300 Raynaud's syndrome without gangrene: Secondary | ICD-10-CM | POA: Diagnosis not present

## 2018-02-15 DIAGNOSIS — J45909 Unspecified asthma, uncomplicated: Secondary | ICD-10-CM | POA: Diagnosis not present

## 2018-02-22 ENCOUNTER — Encounter: Payer: Self-pay | Admitting: *Deleted

## 2018-02-22 DIAGNOSIS — J45909 Unspecified asthma, uncomplicated: Secondary | ICD-10-CM | POA: Diagnosis not present

## 2018-02-22 DIAGNOSIS — R7303 Prediabetes: Secondary | ICD-10-CM | POA: Diagnosis not present

## 2018-02-22 DIAGNOSIS — I10 Essential (primary) hypertension: Secondary | ICD-10-CM | POA: Diagnosis not present

## 2018-02-22 DIAGNOSIS — R69 Illness, unspecified: Secondary | ICD-10-CM | POA: Diagnosis not present

## 2018-02-22 DIAGNOSIS — J910 Malignant pleural effusion: Secondary | ICD-10-CM | POA: Diagnosis not present

## 2018-02-22 DIAGNOSIS — J918 Pleural effusion in other conditions classified elsewhere: Secondary | ICD-10-CM | POA: Diagnosis not present

## 2018-02-22 DIAGNOSIS — C50919 Malignant neoplasm of unspecified site of unspecified female breast: Secondary | ICD-10-CM | POA: Diagnosis not present

## 2018-02-22 DIAGNOSIS — C7800 Secondary malignant neoplasm of unspecified lung: Secondary | ICD-10-CM | POA: Diagnosis not present

## 2018-02-22 DIAGNOSIS — C3491 Malignant neoplasm of unspecified part of right bronchus or lung: Secondary | ICD-10-CM | POA: Diagnosis not present

## 2018-02-22 DIAGNOSIS — Z4682 Encounter for fitting and adjustment of non-vascular catheter: Secondary | ICD-10-CM | POA: Diagnosis not present

## 2018-02-22 DIAGNOSIS — I7300 Raynaud's syndrome without gangrene: Secondary | ICD-10-CM | POA: Diagnosis not present

## 2018-02-22 DIAGNOSIS — M1990 Unspecified osteoarthritis, unspecified site: Secondary | ICD-10-CM | POA: Diagnosis not present

## 2018-02-25 ENCOUNTER — Inpatient Hospital Stay: Payer: Medicare HMO

## 2018-02-25 ENCOUNTER — Inpatient Hospital Stay: Payer: Medicare HMO | Attending: Internal Medicine | Admitting: Internal Medicine

## 2018-02-25 ENCOUNTER — Other Ambulatory Visit: Payer: Self-pay | Admitting: *Deleted

## 2018-02-25 ENCOUNTER — Telehealth: Payer: Self-pay | Admitting: Internal Medicine

## 2018-02-25 ENCOUNTER — Inpatient Hospital Stay: Payer: Medicare HMO | Admitting: *Deleted

## 2018-02-25 ENCOUNTER — Encounter: Payer: Self-pay | Admitting: Internal Medicine

## 2018-02-25 VITALS — BP 174/78 | HR 49 | Temp 97.5°F | Resp 18 | Ht 64.5 in | Wt 130.6 lb

## 2018-02-25 DIAGNOSIS — Z5111 Encounter for antineoplastic chemotherapy: Secondary | ICD-10-CM

## 2018-02-25 DIAGNOSIS — C3491 Malignant neoplasm of unspecified part of right bronchus or lung: Secondary | ICD-10-CM

## 2018-02-25 DIAGNOSIS — J910 Malignant pleural effusion: Secondary | ICD-10-CM | POA: Insufficient documentation

## 2018-02-25 DIAGNOSIS — C3490 Malignant neoplasm of unspecified part of unspecified bronchus or lung: Secondary | ICD-10-CM

## 2018-02-25 DIAGNOSIS — C3411 Malignant neoplasm of upper lobe, right bronchus or lung: Secondary | ICD-10-CM | POA: Insufficient documentation

## 2018-02-25 DIAGNOSIS — J90 Pleural effusion, not elsewhere classified: Secondary | ICD-10-CM

## 2018-02-25 DIAGNOSIS — I10 Essential (primary) hypertension: Secondary | ICD-10-CM | POA: Diagnosis not present

## 2018-02-25 DIAGNOSIS — M546 Pain in thoracic spine: Secondary | ICD-10-CM | POA: Diagnosis not present

## 2018-02-25 LAB — CBC WITH DIFFERENTIAL (CANCER CENTER ONLY)
Basophils Absolute: 0 10*3/uL (ref 0.0–0.1)
Basophils Relative: 1 %
Eosinophils Absolute: 0.1 10*3/uL (ref 0.0–0.5)
Eosinophils Relative: 3 %
HCT: 38.5 % (ref 34.8–46.6)
Hemoglobin: 12.3 g/dL (ref 11.6–15.9)
Lymphocytes Relative: 36 %
Lymphs Abs: 1.4 10*3/uL (ref 0.9–3.3)
MCH: 26.6 pg (ref 25.1–34.0)
MCHC: 31.9 g/dL (ref 31.5–36.0)
MCV: 83.3 fL (ref 79.5–101.0)
Monocytes Absolute: 0.4 10*3/uL (ref 0.1–0.9)
Monocytes Relative: 11 %
Neutro Abs: 1.9 10*3/uL (ref 1.5–6.5)
Neutrophils Relative %: 49 %
Platelet Count: 225 10*3/uL (ref 145–400)
RBC: 4.62 MIL/uL (ref 3.70–5.45)
RDW: 13.5 % (ref 11.2–14.5)
WBC Count: 3.8 10*3/uL — ABNORMAL LOW (ref 3.9–10.3)

## 2018-02-25 LAB — CMP (CANCER CENTER ONLY)
ALT: 21 U/L (ref 0–44)
AST: 22 U/L (ref 15–41)
Albumin: 3.4 g/dL — ABNORMAL LOW (ref 3.5–5.0)
Alkaline Phosphatase: 51 U/L (ref 38–126)
Anion gap: 6 (ref 5–15)
BUN: 15 mg/dL (ref 8–23)
CO2: 30 mmol/L (ref 22–32)
Calcium: 9.5 mg/dL (ref 8.9–10.3)
Chloride: 110 mmol/L (ref 98–111)
Creatinine: 1.07 mg/dL — ABNORMAL HIGH (ref 0.44–1.00)
GFR, Est AFR Am: >60 mL/min (ref >60)
GFR, Estimated: 52 mL/min — ABNORMAL LOW (ref >60)
Glucose, Bld: 98 mg/dL (ref 70–99)
Potassium: 4.6 mmol/L (ref 3.5–5.1)
Sodium: 146 mmol/L — ABNORMAL HIGH (ref 135–145)
Total Bilirubin: 0.3 mg/dL (ref 0.3–1.2)
Total Protein: 7 g/dL (ref 6.5–8.1)

## 2018-02-25 LAB — RESEARCH LABS

## 2018-02-25 NOTE — Telephone Encounter (Signed)
Scheduled appt per 9/9 los - gave patient aVS and calender per los.   

## 2018-02-25 NOTE — Progress Notes (Signed)
[place] [phone]   [phone]  OFFICE PROGRESS NOTE  Allison [name], Allison [name] [id number]  DIAGNOSIS: Stage IV (T2 a,N2, M1a) non-small cell lung cancer, adenocarcinoma diagnosed in July 2019 and presented with right upper lobe lung mass in addition to mediastinal lymphadenopathy as well as bilateral pulmonary nodules and malignant right pleural effusion.  Biomarker Findings Microsatellite status - Cannot Be Determined Tumor Mutational Burden - Cannot Be Determined Genomic Findings For a complete list of the genes assayed, please refer to the Appendix. EGFR exon 19 deletion ([id number]) TP53 Y220C 7 Disease relevant genes with no reportable alterations: KRAS, ALK, BRAF, MET, RET, ERBB2, ROS1   PRIOR THERAPY: Status post right Pleurx catheter placement by Dr. Prescott Gum for drainage of malignant right pleural effusion.  CURRENT THERAPY: Tagrisso 80 mg p.o. daily.  First dose was giving on 01/29/2018. INTERVAL HISTORY: Allison [name] 67 y.o. female to the clinic today for follow-up visit accompanied by her boyfriend.  The patient is feeling fine today with no concerning complaints.  She continues to tolerate her treatment with Tagrisso fairly well.  She denied having any significant skin rash or diarrhea.  She has some dry skin.  She also continues to have drainage of the right pleural effusion ranging between 250 to 450 mL every other day.  She has no nausea, vomiting, diarrhea or constipation.  She denied having any chest pain, shortness of breath, cough or hemoptysis.  She has no fever or chills.  MEDICAL HISTORY: Past Medical History:  Diagnosis Date  . Anxiety   . Arthritis   . Asthma    exercise induced  . Depression    PMH  . GERD (gastroesophageal reflux disease)   . Glaucoma   . Hypertension   . Malignant pleural effusion    right  . PONV (postoperative nausea and vomiting)   .  Pre-diabetes   . Raynaud's disease   . Raynaud's disease     ALLERGIES:  is allergic to penicillins and vicodin [hydrocodone-acetaminophen].  MEDICATIONS:  Current Outpatient Medications  Medication Sig Dispense Refill  . ALPRAZolam (XANAX) 0.25 MG tablet Take 0.'25mg'$  by mouth once daily as needed for anxiety    . amLODipine (NORVASC) 5 MG tablet Take 5 mg by mouth daily.  3  . brimonidine (ALPHAGAN P) 0.1 % SOLN Instill 1 drop in both eye twice daily    . dorzolamide-timolol (COSOPT) 22.3-6.8 MG/ML ophthalmic solution Instill 1 drop in both eyes twice daily    . hydrochlorothiazide (HYDRODIURIL) 25 MG tablet Take 25 mg by mouth daily.  4  . latanoprost (XALATAN) 0.005 % ophthalmic solution Place 1 drop into both eyes at bedtime.     . naproxen sodium (ALEVE) 220 MG tablet Take 220 mg by mouth 2 (two) times daily as needed.    Marland Kitchen osimertinib mesylate (TAGRISSO) 80 MG tablet Take 1 tablet (80 mg total) by mouth daily. 30 tablet 0  . traMADol (ULTRAM) 50 MG tablet Take 50 mg by mouth 2 (two) times daily as needed. for pain  0  . triamcinolone cream (KENALOG) 0.1 % Apply 1 application topically 2 (two) times daily. 30 g 0   No current facility-administered medications for this visit.     SURGICAL HISTORY:  Past Surgical History:  Procedure Laterality Date  . ABDOMINAL HYSTERECTOMY     partial  . CHEST TUBE INSERTION Right 01/01/2018   Procedure: INSERTION PLEURAL DRAINAGE CATHETER;  Surgeon: Prescott Gum, Collier Salina, MD;  Location: [facility];  Service: Thoracic;  Laterality: Right;  . COLONOSCOPY    . DILATION AND CURETTAGE OF UTERUS    . EYE SURGERY     due to Glaucoma  . ROTATOR CUFF REPAIR    . TUBAL LIGATION    . WISDOM TOOTH EXTRACTION      REVIEW OF SYSTEMS:  A comprehensive review of systems was negative except for: Integument/breast: positive for dryness   PHYSICAL EXAMINATION: General appearance: alert, cooperative and no distress Head: Normocephalic, without obvious abnormality,  atraumatic Neck: no adenopathy, no JVD, supple, symmetrical, trachea midline and thyroid not enlarged, symmetric, no tenderness/mass/nodules Lymph nodes: Cervical, supraclavicular, and axillary nodes normal. Resp: clear to auscultation bilaterally Back: symmetric, no curvature. ROM normal. No CVA tenderness. Cardio: regular rate and rhythm, S1, S2 normal, no murmur, click, rub or gallop GI: soft, non-tender; bowel sounds normal; no masses,  no organomegaly Extremities: extremities normal, atraumatic, no cyanosis or edema  ECOG PERFORMANCE STATUS: 1 - Symptomatic but completely ambulatory  Blood pressure (!) 174/78, pulse (!) 49, temperature (!) 97.5 F (36.4 C), temperature source Oral, resp. rate 18, height 5' 4.5" (1.638 m), weight 130 lb 9.6 oz (59.2 kg).  LABORATORY DATA: Lab Results  Component Value Date   WBC 3.8 (L) 02/25/2018   HGB 12.3 02/25/2018   HCT 38.5 02/25/2018   MCV 83.3 02/25/2018   PLT 225 02/25/2018      Chemistry      Component Value Date/Time   NA 143 02/12/2018 1451   K 4.5 02/12/2018 1451   CL 107 02/12/2018 1451   CO2 29 02/12/2018 1451   BUN 20 02/12/2018 1451   CREATININE 1.03 (H) 02/12/2018 1451      Component Value Date/Time   CALCIUM 9.7 02/12/2018 1451   ALKPHOS 50 02/12/2018 1451   AST 18 02/12/2018 1451   ALT 18 02/12/2018 1451   BILITOT <0.2 (L) 02/12/2018 1451       RADIOGRAPHIC STUDIES: Dg Chest 2 View  Result Date: 02/13/2018 CLINICAL DATA:  Right-sided chest discomfort. History of right upper lobe lung malignancy and currently has a right-sided chest tube EXAM: CHEST - 2 VIEW COMPARISON:  PA and lateral chest x-ray of January 07, 2018 FINDINGS: There remains volume loss on the right. There is patchy density in the right pulmonary apex that is fairly stable. There is increased density at the right lung base consistent with pleural fluid. The right-sided chest tube located slightly lower today with the tip projecting over the  posteromedial aspect of the right seventh rib whereas before it projected over the medial aspect of the posterior right fourth rib. There is no mediastinal shift. The left lung is clear. There is no pneumothorax or pneumomediastinum. The heart and pulmonary vascularity are normal. The bony thorax is unremarkable. IMPRESSION: Persistent right-sided abnormalities as described. Slightly lower positioning of the right chest tube without significant increase in the volume of the right pleural effusion. Electronically Signed   By: David  Martinique M.D.   On: 02/13/2018 16:13    ASSESSMENT AND PLAN: This is a very pleasant 67 years old never smoker African-American female recently with a stage IV non-small cell lung cancer, adenocarcinoma with positive EGFR mutation with deletion in exon 19. The patient was started on treatment with Tagrisso 80 mg p.o. daily status post 4 weeks of treatment and has been tolerating this treatment fairly well. I recommended for the patient to continue her current treatment with Tagrisso  with the same dose.  I will see her back for follow-up visit in 1 months for evaluation after repeating CT scan of the chest for restaging of her disease. For the recurrent right pleural effusion, the patient will continue the drainage via the Pleurx catheter and she is followed by Dr. Prescott Gum. She was advised to call immediately if she has any concerning symptoms in the interval. The patient voices understanding of current disease status and treatment options and is in agreement with the current care plan.  All questions were answered. The patient knows to call the clinic with any problems, questions or concerns. We can certainly see the patient much sooner if necessary.  Disclaimer: This note was dictated with voice recognition software. Similar sounding words can inadvertently be transcribed and may not be corrected upon review.

## 2018-03-01 DIAGNOSIS — Z4682 Encounter for fitting and adjustment of non-vascular catheter: Secondary | ICD-10-CM | POA: Diagnosis not present

## 2018-03-01 DIAGNOSIS — R69 Illness, unspecified: Secondary | ICD-10-CM | POA: Diagnosis not present

## 2018-03-01 DIAGNOSIS — J910 Malignant pleural effusion: Secondary | ICD-10-CM | POA: Diagnosis not present

## 2018-03-01 DIAGNOSIS — C3491 Malignant neoplasm of unspecified part of right bronchus or lung: Secondary | ICD-10-CM | POA: Diagnosis not present

## 2018-03-01 DIAGNOSIS — I7300 Raynaud's syndrome without gangrene: Secondary | ICD-10-CM | POA: Diagnosis not present

## 2018-03-01 DIAGNOSIS — I10 Essential (primary) hypertension: Secondary | ICD-10-CM | POA: Diagnosis not present

## 2018-03-01 DIAGNOSIS — R7303 Prediabetes: Secondary | ICD-10-CM | POA: Diagnosis not present

## 2018-03-01 DIAGNOSIS — J45909 Unspecified asthma, uncomplicated: Secondary | ICD-10-CM | POA: Diagnosis not present

## 2018-03-01 DIAGNOSIS — M1990 Unspecified osteoarthritis, unspecified site: Secondary | ICD-10-CM | POA: Diagnosis not present

## 2018-03-05 DIAGNOSIS — I7300 Raynaud's syndrome without gangrene: Secondary | ICD-10-CM | POA: Diagnosis not present

## 2018-03-05 DIAGNOSIS — I10 Essential (primary) hypertension: Secondary | ICD-10-CM | POA: Diagnosis not present

## 2018-03-05 DIAGNOSIS — Z4682 Encounter for fitting and adjustment of non-vascular catheter: Secondary | ICD-10-CM | POA: Diagnosis not present

## 2018-03-05 DIAGNOSIS — M1990 Unspecified osteoarthritis, unspecified site: Secondary | ICD-10-CM | POA: Diagnosis not present

## 2018-03-05 DIAGNOSIS — R69 Illness, unspecified: Secondary | ICD-10-CM | POA: Diagnosis not present

## 2018-03-05 DIAGNOSIS — C3491 Malignant neoplasm of unspecified part of right bronchus or lung: Secondary | ICD-10-CM | POA: Diagnosis not present

## 2018-03-05 DIAGNOSIS — J45909 Unspecified asthma, uncomplicated: Secondary | ICD-10-CM | POA: Diagnosis not present

## 2018-03-05 DIAGNOSIS — J910 Malignant pleural effusion: Secondary | ICD-10-CM | POA: Diagnosis not present

## 2018-03-05 DIAGNOSIS — R7303 Prediabetes: Secondary | ICD-10-CM | POA: Diagnosis not present

## 2018-03-08 ENCOUNTER — Telehealth: Payer: Self-pay

## 2018-03-08 DIAGNOSIS — R7303 Prediabetes: Secondary | ICD-10-CM | POA: Diagnosis not present

## 2018-03-08 DIAGNOSIS — I10 Essential (primary) hypertension: Secondary | ICD-10-CM | POA: Diagnosis not present

## 2018-03-08 DIAGNOSIS — R69 Illness, unspecified: Secondary | ICD-10-CM | POA: Diagnosis not present

## 2018-03-08 DIAGNOSIS — C3491 Malignant neoplasm of unspecified part of right bronchus or lung: Secondary | ICD-10-CM | POA: Diagnosis not present

## 2018-03-08 DIAGNOSIS — I7300 Raynaud's syndrome without gangrene: Secondary | ICD-10-CM | POA: Diagnosis not present

## 2018-03-08 DIAGNOSIS — J45909 Unspecified asthma, uncomplicated: Secondary | ICD-10-CM | POA: Diagnosis not present

## 2018-03-08 DIAGNOSIS — J910 Malignant pleural effusion: Secondary | ICD-10-CM | POA: Diagnosis not present

## 2018-03-08 DIAGNOSIS — M1990 Unspecified osteoarthritis, unspecified site: Secondary | ICD-10-CM | POA: Diagnosis not present

## 2018-03-08 DIAGNOSIS — Z4682 Encounter for fitting and adjustment of non-vascular catheter: Secondary | ICD-10-CM | POA: Diagnosis not present

## 2018-03-08 NOTE — Telephone Encounter (Signed)
Patient called to state that when home health came to drain her pleruX today at first they got out 50 ml's.  She then stated that the home health nurse advised her to take deep breaths to which she then got out 50 more ml's of fluid.  She stated that it was quite taxing and did not wish to do that again when she is drained on Monday.  I advised that if she did not want to do the deep breathing, she did not have to.  She acknowledged receipt.  Also, she had questions about a scan that was going to be done by Dr. Inda Merlin.  I advised her to contact Dr. Arvilla Market nurse for those answers.  She acknowledged receipt.

## 2018-03-13 ENCOUNTER — Encounter: Payer: Self-pay | Admitting: Cardiothoracic Surgery

## 2018-03-13 ENCOUNTER — Ambulatory Visit
Admission: RE | Admit: 2018-03-13 | Discharge: 2018-03-13 | Disposition: A | Discharge status: Home / Self Care | Payer: Medicare HMO | Source: Ambulatory Visit | Attending: Cardiothoracic Surgery | Admitting: Cardiothoracic Surgery

## 2018-03-13 ENCOUNTER — Ambulatory Visit: Payer: Medicare HMO | Admitting: Cardiothoracic Surgery

## 2018-03-13 ENCOUNTER — Other Ambulatory Visit: Payer: Self-pay

## 2018-03-13 ENCOUNTER — Other Ambulatory Visit: Payer: Self-pay | Admitting: *Deleted

## 2018-03-13 VITALS — BP 130/68 | HR 68 | Resp 16 | Ht 64.5 in | Wt 130.0 lb

## 2018-03-13 DIAGNOSIS — R69 Illness, unspecified: Secondary | ICD-10-CM | POA: Diagnosis not present

## 2018-03-13 DIAGNOSIS — J90 Pleural effusion, not elsewhere classified: Secondary | ICD-10-CM

## 2018-03-13 DIAGNOSIS — J910 Malignant pleural effusion: Secondary | ICD-10-CM

## 2018-03-13 DIAGNOSIS — C3491 Malignant neoplasm of unspecified part of right bronchus or lung: Secondary | ICD-10-CM | POA: Diagnosis not present

## 2018-03-13 NOTE — Progress Notes (Signed)
PCP is [name], MD Referring Provider is [name], MD  Chief Complaint  Patient presents with  . Routine Post Op    1 month f/u with a CXR to assess R pleurX/pl effusion....Marland KitchenM-W-F schedule for drainage    HPI: Mostly right Pleurx catheter check History of stage IV adenocarcinoma of the right lung, malignant effusion Patient taking Tagrisso under the direction of Dr. [name] Over the past 2 weeks her drainage has reduced by 50% now approximately 100 cc per session Monday Wednesday Friday.  We will reduce her drainage scheduled to twice a week on Mondays and Thursdays.  Chest x-ray performed today shows minimal left pleural effusion, chest tube in good position. Past Medical History:  Diagnosis Date  . Anxiety   . Arthritis   . Asthma    exercise induced  . Depression    PMH  . GERD (gastroesophageal reflux disease)   . Glaucoma   . Hypertension   . Malignant pleural effusion    right  . PONV (postoperative nausea and vomiting)   . Pre-diabetes   . Raynaud's disease   . Raynaud's disease     Past Surgical History:  Procedure Laterality Date  . ABDOMINAL HYSTERECTOMY     partial  . CHEST TUBE INSERTION Right 01/01/2018   Procedure: INSERTION PLEURAL DRAINAGE CATHETER;  Surgeon: Ivin Poot, MD;  Location: [facility];  Service: Thoracic;  Laterality: Right;  . COLONOSCOPY    . DILATION AND CURETTAGE OF UTERUS    . EYE SURGERY     due to Glaucoma  . ROTATOR CUFF REPAIR    . TUBAL LIGATION    . WISDOM TOOTH EXTRACTION      Family History  Problem Relation Age of Onset  . Heart disease Sister   . Heart disease Brother   . Lung cancer Other     Social History Social History   Tobacco Use  . Smoking status: Never Smoker  . Smokeless tobacco: Never Used  Substance Use Topics  . Alcohol use: Yes    Comment: up to 3 drinks per week  . Drug use: No    Comment: CBD oil     Current Outpatient Medications  Medication Sig Dispense Refill  .  ALPRAZolam (XANAX) 0.25 MG tablet Take 0.25mg  by mouth once daily as needed for anxiety    . amLODipine (NORVASC) 5 MG tablet Take 5 mg by mouth daily.  3  . brimonidine (ALPHAGAN P) 0.1 % SOLN Instill 1 drop in both eye twice daily    . dorzolamide-timolol (COSOPT) 22.3-6.8 MG/ML ophthalmic solution Instill 1 drop in both eyes twice daily    . fenofibrate 160 MG tablet Take 160 mg by mouth daily.    . hydrochlorothiazide (HYDRODIURIL) 25 MG tablet Take 25 mg by mouth daily.  4  . latanoprost (XALATAN) 0.005 % ophthalmic solution Place 1 drop into both eyes at bedtime.     . Multiple Vitamins-Minerals (MULTIVITAMIN ADULT PO) Take 1 tablet by mouth daily.    . naproxen sodium (ALEVE) 220 MG tablet Take 220 mg by mouth 2 (two) times daily as needed.    Marland Kitchen osimertinib mesylate (TAGRISSO) 80 MG tablet Take 1 tablet (80 mg total) by mouth daily. 30 tablet 0  . traMADol (ULTRAM) 50 MG tablet Take 50 mg by mouth 2 (two) times daily as needed. for pain  0  . triamcinolone cream (KENALOG) 0.1 % Apply 1 application topically 2 (two) times daily. 30 g 0   No  current facility-administered medications for this visit.     Allergies  Allergen Reactions  . Penicillins Other (See Comments)    SYNCOPE PATIENT HAS HAD A PCN REACTION WITH IMMEDIATE RASH, FACIAL/TONGUE/THROAT SWELLING, SOB, OR LIGHTHEADEDNESS WITH HYPOTENSION:  #  #  YES  #  # Has patient had a PCN reaction causing severe rash involving mucus membranes or skin necrosis: No Has patient had a PCN reaction that required hospitalization: No Has patient had a PCN reaction occurring within the last 10 years: No   . Vicodin [Hydrocodone-Acetaminophen] Other (See Comments)    Sped up heart and breathing    Review of Systems  Improved overall strength No significant shortness of breath with the Pleurx catheter Able to take bike rides to go to the fitness center with a friend  BP 130/68 (BP Location: Left Arm, Patient Position: Sitting, Cuff Size:  Normal) Comment (Cuff Size): MANUALLY  Pulse 68   Resp 16   Ht 5' 4.5" (1.638 m)   Wt 130 lb (59 kg)   SpO2 96% Comment: ON RA  BMI 21.97 kg/m  Physical Exam     Physical Exam  General: Middle-aged female no acute distress HEENT: Normocephalic pupils equal , dentition adequate Neck: Supple without JVD, adenopathy, or bruit Chest: Clear to auscultation, symmetrical breath sounds, no rhonchi, no tenderness             or deformity.  Right Pleurx catheter site personally examined and is clean and dry.  Pleurx catheter drainage today in the office only 100 cc of clear xanthochromic fluid Cardiovascular: Regular rate and rhythm, no murmur, no gallop, peripheral pulses             palpable in all extremities Abdomen:  Soft, nontender, no palpable mass or organomegaly Extremities: Warm, well-perfused, no clubbing cyanosis edema or tenderness,              no venous stasis changes of the legs Rectal/GU: Deferred Neuro: Grossly non--focal and symmetrical throughout Skin: Clean and dry without rash or ulceration  Diagnostic Tests: Chest x-ray with minimal atelectasis at right base Pleurx catheter in good position with minimal effusion  Impression: Pleurx catheter drainage is helping patient's shortness of breath Because of reduced drainage volume we will reduce the scheduled to Mondays and Thursdays Plan: Return in 1 month with chest x-ray   Len Childs, MD [facility] [phone]

## 2018-03-15 DIAGNOSIS — R69 Illness, unspecified: Secondary | ICD-10-CM | POA: Diagnosis not present

## 2018-03-15 DIAGNOSIS — I10 Essential (primary) hypertension: Secondary | ICD-10-CM | POA: Diagnosis not present

## 2018-03-15 DIAGNOSIS — R7303 Prediabetes: Secondary | ICD-10-CM | POA: Diagnosis not present

## 2018-03-15 DIAGNOSIS — M1990 Unspecified osteoarthritis, unspecified site: Secondary | ICD-10-CM | POA: Diagnosis not present

## 2018-03-15 DIAGNOSIS — J45909 Unspecified asthma, uncomplicated: Secondary | ICD-10-CM | POA: Diagnosis not present

## 2018-03-15 DIAGNOSIS — C3491 Malignant neoplasm of unspecified part of right bronchus or lung: Secondary | ICD-10-CM | POA: Diagnosis not present

## 2018-03-15 DIAGNOSIS — Z4682 Encounter for fitting and adjustment of non-vascular catheter: Secondary | ICD-10-CM | POA: Diagnosis not present

## 2018-03-15 DIAGNOSIS — J910 Malignant pleural effusion: Secondary | ICD-10-CM | POA: Diagnosis not present

## 2018-03-15 DIAGNOSIS — I7300 Raynaud's syndrome without gangrene: Secondary | ICD-10-CM | POA: Diagnosis not present

## 2018-03-18 DIAGNOSIS — R69 Illness, unspecified: Secondary | ICD-10-CM | POA: Diagnosis not present

## 2018-03-18 DIAGNOSIS — J45909 Unspecified asthma, uncomplicated: Secondary | ICD-10-CM | POA: Diagnosis not present

## 2018-03-18 DIAGNOSIS — J910 Malignant pleural effusion: Secondary | ICD-10-CM | POA: Diagnosis not present

## 2018-03-18 DIAGNOSIS — R7303 Prediabetes: Secondary | ICD-10-CM | POA: Diagnosis not present

## 2018-03-18 DIAGNOSIS — M1990 Unspecified osteoarthritis, unspecified site: Secondary | ICD-10-CM | POA: Diagnosis not present

## 2018-03-18 DIAGNOSIS — I10 Essential (primary) hypertension: Secondary | ICD-10-CM | POA: Diagnosis not present

## 2018-03-18 DIAGNOSIS — Z4682 Encounter for fitting and adjustment of non-vascular catheter: Secondary | ICD-10-CM | POA: Diagnosis not present

## 2018-03-18 DIAGNOSIS — C3491 Malignant neoplasm of unspecified part of right bronchus or lung: Secondary | ICD-10-CM | POA: Diagnosis not present

## 2018-03-18 DIAGNOSIS — I7300 Raynaud's syndrome without gangrene: Secondary | ICD-10-CM | POA: Diagnosis not present

## 2018-03-20 ENCOUNTER — Encounter: Payer: Medicare HMO | Admitting: Cardiothoracic Surgery

## 2018-03-20 ENCOUNTER — Telehealth: Payer: Self-pay | Admitting: Internal Medicine

## 2018-03-20 NOTE — Telephone Encounter (Signed)
Faxed medical records to [facility] @ [phone]. Release EE#[id number]

## 2018-03-21 ENCOUNTER — Telehealth: Payer: Self-pay

## 2018-03-21 NOTE — Telephone Encounter (Signed)
Oral Oncology Patient Advocate Encounter  Mrs. Allison [name] received her medicine today! We will not need to send the 2 week supply request form.  [place] Patient [place] Phone [phone] Fax [phone]

## 2018-03-21 NOTE — Telephone Encounter (Signed)
Oral Oncology Patient Advocate Encounter  Patient called and left a message concerned about her Tagrisso refill since she is unable to get in touch with them. I filled out a free 2 week supply form and put it in the doctors folder to sign.  I called the patient to let her know what I was doing and she stated that last night she received a text from where the Tagrisso comes from that she will receive a fed ex package that requires signature. She is certain this is her Allison [name] and will call me back and let me know when it gets there.   Will continue to update  [place] Patient Allison [name] Phone [phone] Fax [phone]

## 2018-03-25 DIAGNOSIS — R69 Illness, unspecified: Secondary | ICD-10-CM | POA: Diagnosis not present

## 2018-03-25 DIAGNOSIS — J45909 Unspecified asthma, uncomplicated: Secondary | ICD-10-CM | POA: Diagnosis not present

## 2018-03-25 DIAGNOSIS — J910 Malignant pleural effusion: Secondary | ICD-10-CM | POA: Diagnosis not present

## 2018-03-25 DIAGNOSIS — Z4682 Encounter for fitting and adjustment of non-vascular catheter: Secondary | ICD-10-CM | POA: Diagnosis not present

## 2018-03-25 DIAGNOSIS — M1990 Unspecified osteoarthritis, unspecified site: Secondary | ICD-10-CM | POA: Diagnosis not present

## 2018-03-25 DIAGNOSIS — C3491 Malignant neoplasm of unspecified part of right bronchus or lung: Secondary | ICD-10-CM | POA: Diagnosis not present

## 2018-03-25 DIAGNOSIS — R7303 Prediabetes: Secondary | ICD-10-CM | POA: Diagnosis not present

## 2018-03-25 DIAGNOSIS — I7300 Raynaud's syndrome without gangrene: Secondary | ICD-10-CM | POA: Diagnosis not present

## 2018-03-25 DIAGNOSIS — I10 Essential (primary) hypertension: Secondary | ICD-10-CM | POA: Diagnosis not present

## 2018-03-26 ENCOUNTER — Other Ambulatory Visit: Payer: Self-pay | Admitting: *Deleted

## 2018-03-26 DIAGNOSIS — C3491 Malignant neoplasm of unspecified part of right bronchus or lung: Secondary | ICD-10-CM

## 2018-03-26 MED ORDER — OSIMERTINIB MESYLATE 80 MG PO TABS: 80.0000 mg | ORAL_TABLET | Freq: Every day | ORAL | 0 refills | Status: DC

## 2018-03-29 ENCOUNTER — Inpatient Hospital Stay: Payer: Medicare HMO | Attending: Internal Medicine

## 2018-03-29 ENCOUNTER — Ambulatory Visit (HOSPITAL_COMMUNITY)
Admission: RE | Admit: 2018-03-29 | Discharge: 2018-03-29 | Disposition: A | Discharge status: Home / Self Care | Payer: Medicare HMO | Source: Ambulatory Visit | Attending: Internal Medicine | Admitting: Internal Medicine

## 2018-03-29 DIAGNOSIS — C3411 Malignant neoplasm of upper lobe, right bronchus or lung: Secondary | ICD-10-CM | POA: Diagnosis not present

## 2018-03-29 DIAGNOSIS — I10 Essential (primary) hypertension: Secondary | ICD-10-CM | POA: Diagnosis not present

## 2018-03-29 DIAGNOSIS — J90 Pleural effusion, not elsewhere classified: Secondary | ICD-10-CM | POA: Diagnosis not present

## 2018-03-29 DIAGNOSIS — J910 Malignant pleural effusion: Secondary | ICD-10-CM | POA: Diagnosis not present

## 2018-03-29 DIAGNOSIS — C3490 Malignant neoplasm of unspecified part of unspecified bronchus or lung: Secondary | ICD-10-CM

## 2018-03-29 LAB — CBC WITH DIFFERENTIAL (CANCER CENTER ONLY)
Abs Immature Granulocytes: 0.01 10*3/uL (ref 0.00–0.07)
Basophils Absolute: 0 10*3/uL (ref 0.0–0.1)
Basophils Relative: 1 %
Eosinophils Absolute: 0.1 10*3/uL (ref 0.0–0.5)
Eosinophils Relative: 2 %
HCT: 38.9 % (ref 36.0–46.0)
Hemoglobin: 12.3 g/dL (ref 12.0–15.0)
Immature Granulocytes: 0 %
Lymphocytes Relative: 36 %
Lymphs Abs: 1.6 10*3/uL (ref 0.7–4.0)
MCH: 25.7 pg — ABNORMAL LOW (ref 26.0–34.0)
MCHC: 31.6 g/dL (ref 30.0–36.0)
MCV: 81.2 fL (ref 80.0–100.0)
Monocytes Absolute: 0.4 10*3/uL (ref 0.1–1.0)
Monocytes Relative: 9 %
Neutro Abs: 2.3 10*3/uL (ref 1.7–7.7)
Neutrophils Relative %: 52 %
Platelet Count: 232 10*3/uL (ref 150–400)
RBC: 4.79 MIL/uL (ref 3.87–5.11)
RDW: 13.7 % (ref 11.5–15.5)
WBC Count: 4.4 10*3/uL (ref 4.0–10.5)
nRBC: 0 % (ref 0.0–0.2)

## 2018-03-29 LAB — CMP (CANCER CENTER ONLY)
ALT: 21 U/L (ref 0–44)
AST: 24 U/L (ref 15–41)
Albumin: 3.8 g/dL (ref 3.5–5.0)
Alkaline Phosphatase: 49 U/L (ref 38–126)
Anion gap: 9 (ref 5–15)
BUN: 15 mg/dL (ref 8–23)
CO2: 27 mmol/L (ref 22–32)
Calcium: 9.8 mg/dL (ref 8.9–10.3)
Chloride: 106 mmol/L (ref 98–111)
Creatinine: 0.99 mg/dL (ref 0.44–1.00)
GFR, Est AFR Am: >60 mL/min (ref >60)
GFR, Estimated: 58 mL/min — ABNORMAL LOW (ref >60)
Glucose, Bld: 86 mg/dL (ref 70–99)
Potassium: 3.6 mmol/L (ref 3.5–5.1)
Sodium: 142 mmol/L (ref 135–145)
Total Bilirubin: 0.2 mg/dL — ABNORMAL LOW (ref 0.3–1.2)
Total Protein: 7.7 g/dL (ref 6.5–8.1)

## 2018-03-29 MED ORDER — IOHEXOL 300 MG/ML  SOLN
100.0000 mL | Freq: Once | INTRAMUSCULAR | Status: AC | PRN
  Administered 2018-03-29: 100 mL via INTRAVENOUS

## 2018-03-29 MED ORDER — SODIUM CHLORIDE 0.9 % IJ SOLN
INTRAMUSCULAR | Status: AC
  Filled 2018-03-29: qty 50

## 2018-04-01 DIAGNOSIS — Z4682 Encounter for fitting and adjustment of non-vascular catheter: Secondary | ICD-10-CM | POA: Diagnosis not present

## 2018-04-01 DIAGNOSIS — J910 Malignant pleural effusion: Secondary | ICD-10-CM | POA: Diagnosis not present

## 2018-04-01 DIAGNOSIS — J45909 Unspecified asthma, uncomplicated: Secondary | ICD-10-CM | POA: Diagnosis not present

## 2018-04-01 DIAGNOSIS — C3491 Malignant neoplasm of unspecified part of right bronchus or lung: Secondary | ICD-10-CM | POA: Diagnosis not present

## 2018-04-01 DIAGNOSIS — M1990 Unspecified osteoarthritis, unspecified site: Secondary | ICD-10-CM | POA: Diagnosis not present

## 2018-04-01 DIAGNOSIS — I10 Essential (primary) hypertension: Secondary | ICD-10-CM | POA: Diagnosis not present

## 2018-04-01 DIAGNOSIS — R69 Illness, unspecified: Secondary | ICD-10-CM | POA: Diagnosis not present

## 2018-04-01 DIAGNOSIS — I7300 Raynaud's syndrome without gangrene: Secondary | ICD-10-CM | POA: Diagnosis not present

## 2018-04-01 DIAGNOSIS — R7303 Prediabetes: Secondary | ICD-10-CM | POA: Diagnosis not present

## 2018-04-02 ENCOUNTER — Inpatient Hospital Stay: Payer: Medicare HMO | Admitting: Oncology

## 2018-04-02 ENCOUNTER — Encounter: Payer: Self-pay | Admitting: Oncology

## 2018-04-02 ENCOUNTER — Telehealth: Payer: Self-pay

## 2018-04-02 VITALS — BP 160/86 | HR 61 | Temp 98.4°F | Resp 17 | Ht 64.5 in | Wt 131.4 lb

## 2018-04-02 DIAGNOSIS — Z5111 Encounter for antineoplastic chemotherapy: Secondary | ICD-10-CM

## 2018-04-02 DIAGNOSIS — J918 Pleural effusion in other conditions classified elsewhere: Secondary | ICD-10-CM | POA: Diagnosis not present

## 2018-04-02 DIAGNOSIS — J910 Malignant pleural effusion: Secondary | ICD-10-CM

## 2018-04-02 DIAGNOSIS — C7800 Secondary malignant neoplasm of unspecified lung: Secondary | ICD-10-CM | POA: Diagnosis not present

## 2018-04-02 DIAGNOSIS — I10 Essential (primary) hypertension: Secondary | ICD-10-CM | POA: Diagnosis not present

## 2018-04-02 DIAGNOSIS — C3411 Malignant neoplasm of upper lobe, right bronchus or lung: Secondary | ICD-10-CM

## 2018-04-02 DIAGNOSIS — C50919 Malignant neoplasm of unspecified site of unspecified female breast: Secondary | ICD-10-CM | POA: Diagnosis not present

## 2018-04-02 DIAGNOSIS — C3491 Malignant neoplasm of unspecified part of right bronchus or lung: Secondary | ICD-10-CM

## 2018-04-02 NOTE — Assessment & Plan Note (Addendum)
This is a very pleasant 67 year old never smoker African-American female recently with a stage IV non-small cell lung cancer, adenocarcinoma with positive EGFR mutation with deletion in exon 19. The patient was started on treatment with Tagrisso 80 mg p.o. daily status post 2 months of treatment and has been tolerating this treatment fairly well. She had a restaging CT scan of the chest, abdomen, pelvis and is here to discuss the results.  The patient was seen with Dr. Julien Nordmann.  CT scan results were discussed with the patient which showed improvement in her disease.  There is no evidence of disease progression.  Recommend that she continue on Tagrisso 80 mg daily.  She will follow-up in 1 month for evaluation and repeat lab work.  For her high blood pressure, recommend that she continue to check this at home.  According to the long that she brought with her today, her blood pressure has been normal at home.  We advised her to contact her primary care provider if her blood pressure is elevated consistently when checking it at home.  For the recurrent right pleural effusion, the patient will continue the drainage via the Pleurx catheter and she is followed by Dr. Prescott Gum.  She was advised to call immediately if she has any concerning symptoms in the interval. The patient voices understanding of current disease status and treatment options and is in agreement with the current care plan.  All questions were answered. The patient knows to call the clinic with any problems, questions or concerns. We can certainly see the patient much sooner if necessary.

## 2018-04-02 NOTE — Telephone Encounter (Signed)
Printed avs and calender of upcoming appointment. Per 10/15 los 

## 2018-04-02 NOTE — Progress Notes (Signed)
[facility] OFFICE PROGRESS NOTE  [name], [place] [address]  DIAGNOSIS: Stage IV (T2 a,N2, M1a)non-small cell lung cancer, adenocarcinoma diagnosed in July 2019 and presented with right upper lobe lung mass in addition to mediastinal lymphadenopathy as well as bilateral pulmonary nodules and malignant right pleural effusion.  Biomarker Findings Microsatellite status - Cannot Be Determined Tumor Mutational Burden - Cannot Be Determined Genomic Findings For a complete list of the genes assayed, please refer to the Appendix. EGFR exon 19 deletion ([id number]) TP53 Y220C 7 Disease relevant genes with no reportable alterations: KRAS, ALK, BRAF, MET, RET, ERBB2, ROS1   PRIOR THERAPY: Status post right Pleurx catheter placement by Dr. Prescott Gum for drainage of malignant right pleural effusion.  CURRENT THERAPY: Tagrisso 80 mg p.o. daily.  First dose was given on 01/29/2018.  Status post 2 months of treatment.  INTERVAL HISTORY: Allison [name] 67 y.o. female returns for routine follow-up visit accompanied by her boyfriend.  The patient is feeling fine today and has no specific complaints.  She continues to tolerate treatment with Tagrisso fairly well.  She denies fevers and chills.  Denies chest pain, shortness of breath, cough, hemoptysis.  She continues to drain her Pleurx catheter twice a week.  Output has ranged from 100 to 350 cc.  Denies nausea, vomiting, constipation, diarrhea.  Denies recent weight loss or night sweats.  Denies skin rashes.  The patient had a restaging CT scan and is here to discuss the results.  MEDICAL HISTORY: Past Medical History:  Diagnosis Date  . Anxiety   . Arthritis   . Asthma    exercise induced  . Depression    PMH  . GERD (gastroesophageal reflux disease)   . Glaucoma   . Hypertension   . Malignant pleural effusion    right  . PONV (postoperative nausea and vomiting)   . Pre-diabetes    . Raynaud's disease   . Raynaud's disease     ALLERGIES:  is allergic to penicillins and vicodin [hydrocodone-acetaminophen].  MEDICATIONS:  Current Outpatient Medications  Medication Sig Dispense Refill  . ALPRAZolam (XANAX) 0.25 MG tablet Take 0.9m by mouth once daily as needed for anxiety    . amLODipine (NORVASC) 5 MG tablet Take 5 mg by mouth daily.  3  . brimonidine (ALPHAGAN P) 0.1 % SOLN Instill 1 drop in both eye twice daily    . dorzolamide-timolol (COSOPT) 22.3-6.8 MG/ML ophthalmic solution Instill 1 drop in both eyes twice daily    . fenofibrate 160 MG tablet Take 160 mg by mouth daily.    . hydrochlorothiazide (HYDRODIURIL) 25 MG tablet Take 25 mg by mouth daily.  4  . latanoprost (XALATAN) 0.005 % ophthalmic solution Place 1 drop into both eyes at bedtime.     . Multiple Vitamins-Minerals (MULTIVITAMIN ADULT PO) Take 1 tablet by mouth daily.    . naproxen sodium (ALEVE) 220 MG tablet Take 220 mg by mouth 2 (two) times daily as needed.    .Marland Kitchenosimertinib mesylate (TAGRISSO) 80 MG tablet Take 1 tablet (80 mg total) by mouth daily. Refill Faxed to [facility] 03/26/18 30 tablet 0  . traMADol (ULTRAM) 50 MG tablet Take 50 mg by mouth 2 (two) times daily as needed. for pain  0  . triamcinolone cream (KENALOG) 0.1 % Apply 1 application topically 2 (two) times daily. 30 g 0   No current facility-administered medications for this visit.     SURGICAL HISTORY:  Past  Surgical History:  Procedure Laterality Date  . ABDOMINAL HYSTERECTOMY     partial  . CHEST TUBE INSERTION Right 01/01/2018   Procedure: INSERTION PLEURAL DRAINAGE CATHETER;  Surgeon: Ivin Poot, MD;  Location: [facility];  Service: Thoracic;  Laterality: Right;  . COLONOSCOPY    . DILATION AND CURETTAGE OF UTERUS    . EYE SURGERY     due to Glaucoma  . ROTATOR CUFF REPAIR    . TUBAL LIGATION    . WISDOM TOOTH EXTRACTION      REVIEW OF SYSTEMS:   Review of Systems  Constitutional: Negative for appetite change,  chills, fatigue, fever and unexpected weight change.  HENT:   Negative for mouth sores, nosebleeds, sore throat and trouble swallowing.   Eyes: Negative for eye problems and icterus.  Respiratory: Negative for cough, hemoptysis, shortness of breath and wheezing.   Cardiovascular: Negative for chest pain and leg swelling.  Gastrointestinal: Negative for abdominal pain, constipation, diarrhea, nausea and vomiting.  Genitourinary: Negative for bladder incontinence, difficulty urinating, dysuria, frequency and hematuria.   Musculoskeletal: Negative for back pain, gait problem, neck pain and neck stiffness.  Skin: Negative for itching and rash.  Neurological: Negative for dizziness, extremity weakness, gait problem, headaches, light-headedness and seizures.  Hematological: Negative for adenopathy. Does not bruise/bleed easily.  Psychiatric/Behavioral: Negative for confusion, depression and sleep disturbance. The patient is not nervous/anxious.     PHYSICAL EXAMINATION:  Blood pressure (!) 160/86, pulse 61, temperature 98.4 F (36.9 C), temperature source Oral, resp. rate 17, height 5' 4.5" (1.638 m), weight 131 lb 6.4 oz (59.6 kg), SpO2 100 %.  ECOG PERFORMANCE STATUS: 1 - Symptomatic but completely ambulatory  Physical Exam  Constitutional: Oriented to person, place, and time and well-developed, well-nourished, and in no distress. No distress.  HENT:  Head: Normocephalic and atraumatic.  Mouth/Throat: Oropharynx is clear and moist. No oropharyngeal exudate.  Eyes: Conjunctivae are normal. Right eye exhibits no discharge. Left eye exhibits no discharge. No scleral icterus.  Neck: Normal range of motion. Neck supple.  Cardiovascular: Normal rate, regular rhythm, normal heart sounds and intact distal pulses.   Pulmonary/Chest: Effort normal and breath sounds normal. No respiratory distress. No wheezes. No rales.  Abdominal: Soft. Bowel sounds are normal. Exhibits no distension and no mass.  There is no tenderness.  Musculoskeletal: Normal range of motion. Exhibits no edema.  Lymphadenopathy:    No cervical adenopathy.  Neurological: Alert and oriented to person, place, and time. Exhibits normal muscle tone. Gait normal. Coordination normal.  Skin: Skin is warm and dry. No rash noted. Not diaphoretic. No erythema. No pallor.  Psychiatric: Mood, memory and judgment normal.  Vitals reviewed.  LABORATORY DATA: Lab Results  Component Value Date   WBC 4.4 03/29/2018   HGB 12.3 03/29/2018   HCT 38.9 03/29/2018   MCV 81.2 03/29/2018   PLT 232 03/29/2018      Chemistry      Component Value Date/Time   NA 142 03/29/2018 1238   K 3.6 03/29/2018 1238   CL 106 03/29/2018 1238   CO2 27 03/29/2018 1238   BUN 15 03/29/2018 1238   CREATININE 0.99 03/29/2018 1238      Component Value Date/Time   CALCIUM 9.8 03/29/2018 1238   ALKPHOS 49 03/29/2018 1238   AST 24 03/29/2018 1238   ALT 21 03/29/2018 1238   BILITOT 0.2 (L) 03/29/2018 1238       RADIOGRAPHIC STUDIES:  Dg Chest 2 View  Result Date: 03/13/2018 CLINICAL  DATA:  Shortness of breath, pleural effusion, right PleurX EXAM: CHEST - 2 VIEW COMPARISON:  02/05/2018 FINDINGS: Similar small right pleural effusion. Right PleurX catheter remains. Stable right apical spiculated opacity and apical blebs. Residual chronic opacity in the right lower lobe. Left lung remains clear. Normal heart size and vascularity. Trachea is midline. No significant interval change or superimposed acute process. IMPRESSION: Stable right hemothorax findings with a small residual pleural effusion. No significant interval change. Electronically Signed   By: Jerilynn Mages.  Shick M.D.   On: 03/13/2018 17:26   Ct Chest W Contrast  Result Date: 03/31/2018 CLINICAL DATA:  Lung cancer, non-small-cell. Diagnosed in June. Currently on immunotherapy. EXAM: CT CHEST, ABDOMEN, AND PELVIS WITH CONTRAST TECHNIQUE: Multidetector CT imaging of the chest, abdomen and pelvis was  performed following the standard protocol during bolus administration of intravenous contrast. CONTRAST:  165m OMNIPAQUE IOHEXOL 300 MG/ML  SOLN COMPARISON:  Plain film 03/13/2018. PET 12/26/2017. Chest CT 12/11/2017. Clinic note 02/12/2018 FINDINGS: CT CHEST FINDINGS Cardiovascular: Aortic atherosclerosis. Tortuous thoracic aorta. Normal heart size, without pericardial effusion. No central pulmonary embolism, on this non-dedicated study. Mediastinum/Nodes: No supraclavicular adenopathy. No mediastinal or hilar adenopathy. Subcentimeter bilateral thyroid nodules are of doubtful clinical significance. Lungs/Pleura: Significantly decreased right sided pleural effusion, tiny. Right-sided PleurX catheter in place. No left-sided pleural effusion. Minimal motion degradation in the upper chest. Right apical primary measures 2.7 x 1.9 cm on image 32/5. Compare 3.3 x 2.6 cm on the prior PET. Thickening along the right major fissure is again identified. The anterior right lower lobe perifissural nodule measures 11 mm on image 90/5 versus 1.6 cm on the prior. Left-sided pulmonary nodules on the order of 3-4 mm are similar, including on images 45, 61, 74, 93. Musculoskeletal: No acute osseous abnormality. CT ABDOMEN PELVIS FINDINGS Hepatobiliary: Normal liver. Normal gallbladder, without biliary ductal dilatation. Pancreas: Normal, without mass or ductal dilatation. Spleen: Normal in size, without focal abnormality. Adrenals/Urinary Tract: Normal adrenal glands. Normal left kidney. Right renal too small to characterize lesion. Normal urinary bladder. Stomach/Bowel: Normal stomach, without wall thickening. Normal colon and terminal ileum. Normal small bowel. Vascular/Lymphatic: Aortic and branch vessel atherosclerosis. No abdominopelvic adenopathy. Reproductive: Hysterectomy.  No adnexal mass. Other: No significant free fluid. No evidence of omental or peritoneal disease. Musculoskeletal: Trace L5-S1 anterolisthesis.  Transitional S1 vertebral body. IMPRESSION: 1. Response to therapy as evidenced by decrease in right apical lung mass and dominant pleural-based right lower lobe pulmonary nodule. Decrease in right-sided pleural effusion, with PleurX catheter in place. 2. Tiny left-sided pulmonary nodules are unchanged and nonspecific. 3. No new or progressive disease. 4. No acute process or evidence of metastatic disease in the abdomen or pelvis. Electronically Signed   By: KAbigail MiyamotoM.D.   On: 03/31/2018 15:59   Ct Abdomen Pelvis W Contrast  Result Date: 03/31/2018 CLINICAL DATA:  Lung cancer, non-small-cell. Diagnosed in June. Currently on immunotherapy. EXAM: CT CHEST, ABDOMEN, AND PELVIS WITH CONTRAST TECHNIQUE: Multidetector CT imaging of the chest, abdomen and pelvis was performed following the standard protocol during bolus administration of intravenous contrast. CONTRAST:  1069mOMNIPAQUE IOHEXOL 300 MG/ML  SOLN COMPARISON:  Plain film 03/13/2018. PET 12/26/2017. Chest CT 12/11/2017. Clinic note 02/12/2018 FINDINGS: CT CHEST FINDINGS Cardiovascular: Aortic atherosclerosis. Tortuous thoracic aorta. Normal heart size, without pericardial effusion. No central pulmonary embolism, on this non-dedicated study. Mediastinum/Nodes: No supraclavicular adenopathy. No mediastinal or hilar adenopathy. Subcentimeter bilateral thyroid nodules are of doubtful clinical significance. Lungs/Pleura: Significantly decreased right sided pleural effusion, tiny. Right-sided  PleurX catheter in place. No left-sided pleural effusion. Minimal motion degradation in the upper chest. Right apical primary measures 2.7 x 1.9 cm on image 32/5. Compare 3.3 x 2.6 cm on the prior PET. Thickening along the right major fissure is again identified. The anterior right lower lobe perifissural nodule measures 11 mm on image 90/5 versus 1.6 cm on the prior. Left-sided pulmonary nodules on the order of 3-4 mm are similar, including on images 45, 61, 74, 93.  Musculoskeletal: No acute osseous abnormality. CT ABDOMEN PELVIS FINDINGS Hepatobiliary: Normal liver. Normal gallbladder, without biliary ductal dilatation. Pancreas: Normal, without mass or ductal dilatation. Spleen: Normal in size, without focal abnormality. Adrenals/Urinary Tract: Normal adrenal glands. Normal left kidney. Right renal too small to characterize lesion. Normal urinary bladder. Stomach/Bowel: Normal stomach, without wall thickening. Normal colon and terminal ileum. Normal small bowel. Vascular/Lymphatic: Aortic and branch vessel atherosclerosis. No abdominopelvic adenopathy. Reproductive: Hysterectomy.  No adnexal mass. Other: No significant free fluid. No evidence of omental or peritoneal disease. Musculoskeletal: Trace L5-S1 anterolisthesis. Transitional S1 vertebral body. IMPRESSION: 1. Response to therapy as evidenced by decrease in right apical lung mass and dominant pleural-based right lower lobe pulmonary nodule. Decrease in right-sided pleural effusion, with PleurX catheter in place. 2. Tiny left-sided pulmonary nodules are unchanged and nonspecific. 3. No new or progressive disease. 4. No acute process or evidence of metastatic disease in the abdomen or pelvis. Electronically Signed   By: Abigail Miyamoto M.D.   On: 03/31/2018 15:59     ASSESSMENT/PLAN:  Adenocarcinoma of right lung, stage 4 (HCC) This is a very pleasant 67 year old never smoker African-American female recently with a stage IV non-small cell lung cancer, adenocarcinoma with positive EGFR mutation with deletion in exon 19. The patient was started on treatment with Tagrisso 80 mg p.o. daily status post 2 months of treatment and has been tolerating this treatment fairly well. She had a restaging CT scan of the chest, abdomen, pelvis and is here to discuss the results.  The patient was seen with Dr. Julien Nordmann.  CT scan results were discussed with the patient which showed improvement in her disease.  There is no evidence of  disease progression.  Recommend that she continue on Tagrisso 80 mg daily.  She will follow-up in 1 month for evaluation and repeat lab work.  For her high blood pressure, recommend that she continue to check this at home.  According to the long that she brought with her today, her blood pressure has been normal at home.  We advised her to contact her primary care provider if her blood pressure is elevated consistently when checking it at home.  For the recurrent right pleural effusion, the patient will continue the drainage via the Pleurx catheter and she is followed by Dr. Prescott Gum.  She was advised to call immediately if she has any concerning symptoms in the interval. The patient voices understanding of current disease status and treatment options and is in agreement with the current care plan.  All questions were answered. The patient knows to call the clinic with any problems, questions or concerns. We can certainly see the patient much sooner if necessary.   Orders Placed This Encounter  Procedures  . CBC with Differential (Cancer Center Only)    Standing Status:   Future    Standing Expiration Date:   04/03/2019  . CMP ([facility] only)    Standing Status:   Future    Standing Expiration Date:   04/03/2019  Mikey Bussing, DNP, AGPCNP-BC, AOCNP 04/02/18   ADDENDUM: Hematology/Oncology Attending: I had a face-to-face encounter with the patient today.  I recommended her care plan.  This is a very pleasant 67 years old African-American female recently diagnosed with non-small cell lung cancer, adenocarcinoma with positive EGFR mutation with deletion in exon 41.  The patient is currently on treatment with Tagrisso 80 mg p.o. daily.  She has been tolerating this treatment well with no concerning adverse effect.  She specifically denied having any significant skin rash or diarrhea.  She has no chest pain, shortness of breath, cough or hemoptysis. The patient had repeat CT scan  of the chest, abdomen and pelvis performed recently.  I personally and independently reviewed the scan images and discussed the results with the patient and her boyfriend today.  Her scan showed improvement of her disease. I recommended for the patient to continue her current treatment with Tagrisso with the same dose.  For hypertension she was advised to monitor her blood pressure closely at home and to report to her primary care physician if persisted to be elevated. The patient will come back for follow-up visit in 1 months for evaluation and repeat blood work. She was advised to call immediately if she has any concerning symptoms in the interval.  Disclaimer: This note was dictated with voice recognition software. Similar sounding words can inadvertently be transcribed and may be missed upon review. Eilleen Kempf, MD 04/02/18

## 2018-04-03 DIAGNOSIS — H401112 Primary open-angle glaucoma, right eye, moderate stage: Secondary | ICD-10-CM | POA: Diagnosis not present

## 2018-04-03 DIAGNOSIS — H401121 Primary open-angle glaucoma, left eye, mild stage: Secondary | ICD-10-CM | POA: Diagnosis not present

## 2018-04-03 DIAGNOSIS — H04123 Dry eye syndrome of bilateral lacrimal glands: Secondary | ICD-10-CM | POA: Diagnosis not present

## 2018-04-03 DIAGNOSIS — H2513 Age-related nuclear cataract, bilateral: Secondary | ICD-10-CM | POA: Diagnosis not present

## 2018-04-05 ENCOUNTER — Telehealth: Payer: Self-pay | Admitting: Internal Medicine

## 2018-04-05 NOTE — Telephone Encounter (Signed)
Appt for lab moved and patient notified LMVM per 0/18 sch msg

## 2018-04-08 DIAGNOSIS — M1990 Unspecified osteoarthritis, unspecified site: Secondary | ICD-10-CM | POA: Diagnosis not present

## 2018-04-08 DIAGNOSIS — R69 Illness, unspecified: Secondary | ICD-10-CM | POA: Diagnosis not present

## 2018-04-08 DIAGNOSIS — I7300 Raynaud's syndrome without gangrene: Secondary | ICD-10-CM | POA: Diagnosis not present

## 2018-04-08 DIAGNOSIS — J910 Malignant pleural effusion: Secondary | ICD-10-CM | POA: Diagnosis not present

## 2018-04-08 DIAGNOSIS — C3491 Malignant neoplasm of unspecified part of right bronchus or lung: Secondary | ICD-10-CM | POA: Diagnosis not present

## 2018-04-08 DIAGNOSIS — R7303 Prediabetes: Secondary | ICD-10-CM | POA: Diagnosis not present

## 2018-04-08 DIAGNOSIS — Z4682 Encounter for fitting and adjustment of non-vascular catheter: Secondary | ICD-10-CM | POA: Diagnosis not present

## 2018-04-08 DIAGNOSIS — I10 Essential (primary) hypertension: Secondary | ICD-10-CM | POA: Diagnosis not present

## 2018-04-08 DIAGNOSIS — J45909 Unspecified asthma, uncomplicated: Secondary | ICD-10-CM | POA: Diagnosis not present

## 2018-04-15 ENCOUNTER — Other Ambulatory Visit: Payer: Self-pay | Admitting: Cardiothoracic Surgery

## 2018-04-15 DIAGNOSIS — R69 Illness, unspecified: Secondary | ICD-10-CM | POA: Diagnosis not present

## 2018-04-15 DIAGNOSIS — C3491 Malignant neoplasm of unspecified part of right bronchus or lung: Secondary | ICD-10-CM | POA: Diagnosis not present

## 2018-04-15 DIAGNOSIS — I7300 Raynaud's syndrome without gangrene: Secondary | ICD-10-CM | POA: Diagnosis not present

## 2018-04-15 DIAGNOSIS — I10 Essential (primary) hypertension: Secondary | ICD-10-CM | POA: Diagnosis not present

## 2018-04-15 DIAGNOSIS — J45909 Unspecified asthma, uncomplicated: Secondary | ICD-10-CM | POA: Diagnosis not present

## 2018-04-15 DIAGNOSIS — J910 Malignant pleural effusion: Secondary | ICD-10-CM | POA: Diagnosis not present

## 2018-04-15 DIAGNOSIS — M1990 Unspecified osteoarthritis, unspecified site: Secondary | ICD-10-CM | POA: Diagnosis not present

## 2018-04-15 DIAGNOSIS — Z4682 Encounter for fitting and adjustment of non-vascular catheter: Secondary | ICD-10-CM | POA: Diagnosis not present

## 2018-04-15 DIAGNOSIS — R7303 Prediabetes: Secondary | ICD-10-CM | POA: Diagnosis not present

## 2018-04-17 ENCOUNTER — Ambulatory Visit: Payer: Medicare HMO | Admitting: Cardiothoracic Surgery

## 2018-04-17 ENCOUNTER — Other Ambulatory Visit: Payer: Self-pay

## 2018-04-17 ENCOUNTER — Encounter: Payer: Self-pay | Admitting: Cardiothoracic Surgery

## 2018-04-17 ENCOUNTER — Encounter: Payer: Medicare HMO | Admitting: Cardiothoracic Surgery

## 2018-04-17 ENCOUNTER — Ambulatory Visit
Admission: RE | Admit: 2018-04-17 | Discharge: 2018-04-17 | Disposition: A | Discharge status: Home / Self Care | Payer: Medicare HMO | Source: Ambulatory Visit | Attending: Cardiothoracic Surgery | Admitting: Cardiothoracic Surgery

## 2018-04-17 VITALS — BP 150/70 | HR 52 | Resp 16 | Ht 64.5 in | Wt 131.0 lb

## 2018-04-17 DIAGNOSIS — J910 Malignant pleural effusion: Secondary | ICD-10-CM

## 2018-04-17 DIAGNOSIS — J90 Pleural effusion, not elsewhere classified: Secondary | ICD-10-CM

## 2018-04-17 DIAGNOSIS — Z9689 Presence of other specified functional implants: Secondary | ICD-10-CM

## 2018-04-17 DIAGNOSIS — C3490 Malignant neoplasm of unspecified part of unspecified bronchus or lung: Secondary | ICD-10-CM | POA: Diagnosis not present

## 2018-04-17 DIAGNOSIS — C3491 Malignant neoplasm of unspecified part of right bronchus or lung: Secondary | ICD-10-CM | POA: Diagnosis not present

## 2018-04-17 NOTE — Progress Notes (Signed)
PCP is [name], MD Referring Provider is [name], MD  Chief Complaint  Patient presents with  . Pleural Effusion    1 month f.u with CXR, currently draining pleurX  Mon/Thurs    HPI: Routine visit for right Pleurx catheter check Advanced stage adenocarcinoma the right lung with malignant pleural effusion Pleurx catheter placed July 2019 Drainage schedule continues Mondays and Thursdays with drainage of approximately 200- 250 cc Patient on oral chemotherapy, Tagrisso.  She states last scan showed some regression. Chest x-ray today image personally reviewed.  Catheter in good position.  Minimal right pleural effusion.   Past Medical History:  Diagnosis Date  . Anxiety   . Arthritis   . Asthma    exercise induced  . Depression    PMH  . GERD (gastroesophageal reflux disease)   . Glaucoma   . Hypertension   . Malignant pleural effusion    right  . PONV (postoperative nausea and vomiting)   . Pre-diabetes   . Raynaud's disease   . Raynaud's disease     Past Surgical History:  Procedure Laterality Date  . ABDOMINAL HYSTERECTOMY     partial  . CHEST TUBE INSERTION Right 01/01/2018   Procedure: INSERTION PLEURAL DRAINAGE CATHETER;  Surgeon: Ivin Poot, MD;  Location: [facility];  Service: Thoracic;  Laterality: Right;  . COLONOSCOPY    . DILATION AND CURETTAGE OF UTERUS    . EYE SURGERY     due to Glaucoma  . ROTATOR CUFF REPAIR    . TUBAL LIGATION    . WISDOM TOOTH EXTRACTION      Family History  Problem Relation Age of Onset  . Heart disease Sister   . Heart disease Brother   . Lung cancer Other     Social History Social History   Tobacco Use  . Smoking status: Never Smoker  . Smokeless tobacco: Never Used  Substance Use Topics  . Alcohol use: Yes    Comment: up to 3 drinks per week  . Drug use: No    Comment: CBD oil     Current Outpatient Medications  Medication Sig Dispense Refill  . ALPRAZolam (XANAX) 0.25 MG tablet Take  0.25mg  by mouth once daily as needed for anxiety    . amLODipine (NORVASC) 5 MG tablet Take 5 mg by mouth daily.  3  . brimonidine (ALPHAGAN P) 0.1 % SOLN Instill 1 drop in both eye twice daily    . dorzolamide-timolol (COSOPT) 22.3-6.8 MG/ML ophthalmic solution Instill 1 drop in both eyes twice daily    . fenofibrate 160 MG tablet Take 160 mg by mouth daily.    . hydrochlorothiazide (HYDRODIURIL) 25 MG tablet Take 25 mg by mouth daily.  4  . latanoprost (XALATAN) 0.005 % ophthalmic solution Place 1 drop into both eyes at bedtime.     . Multiple Vitamins-Minerals (MULTIVITAMIN ADULT PO) Take 1 tablet by mouth daily.    . naproxen sodium (ALEVE) 220 MG tablet Take 220 mg by mouth 2 (two) times daily as needed.    Marland Kitchen osimertinib mesylate (TAGRISSO) 80 MG tablet Take 1 tablet (80 mg total) by mouth daily. Refill Faxed to [facility] 03/26/18 30 tablet 0  . traMADol (ULTRAM) 50 MG tablet Take 50 mg by mouth 2 (two) times daily as needed. for pain  0  . triamcinolone cream (KENALOG) 0.1 % Apply 1 application topically 2 (two) times daily. 30 g 0   No current facility-administered medications for this visit.  Allergies  Allergen Reactions  . Penicillins Other (See Comments)    [facility] PATIENT HAS HAD A PCN REACTION WITH IMMEDIATE RASH, FACIAL/TONGUE/THROAT SWELLING, SOB, OR LIGHTHEADEDNESS WITH HYPOTENSION:  #  #  YES  #  # Has patient had a PCN reaction causing severe rash involving mucus membranes or skin necrosis: No Has patient had a PCN reaction that required hospitalization: No Has patient had a PCN reaction occurring within the last 10 years: No   . Vicodin [Hydrocodone-Acetaminophen] Other (See Comments)    Sped up heart and breathing    Review of Systems   Patient had transient episode of sharp right upper chest pain yesterday requiring tramadol for relief of pain.  Pain is not present today. Patient remains active, going to the fitness center and slowly gaining weight No complaints  about Pleurx catheter drainage or catheter dysfunction  BP (!) 150/70 (BP Location: Left Arm, Patient Position: Sitting, Cuff Size: Normal)   Pulse (!) 52   Resp 16   Ht 5' 4.5" (1.638 m)   Wt 131 lb (59.4 kg)   SpO2 96%   BMI 22.14 kg/m  Physical Exam       Exam    General- alert and comfortable    Neck- no JVD, no cervical adenopathy palpable, no carotid bruit   Lungs- clear without rales, wheezes   Cor- regular rate and rhythm, no murmur , gallop   Abdomen- soft, non-tender   Extremities - warm, non-tender, minimal edema   Neuro- oriented, appropriate, no focal weakness  Diagnostic Tests: Chest x-ray shows right Pleurx catheter in good position  Impression: Pleurx catheter keeping patient asymptomatic She will need to continue twice weekly drainage until output is 100 cc or less then reduce to once weekly Plan: Return in 1 month with chest x-ray  Len Childs, MD [facility] [phone]

## 2018-04-22 DIAGNOSIS — J910 Malignant pleural effusion: Secondary | ICD-10-CM | POA: Diagnosis not present

## 2018-04-22 DIAGNOSIS — J45909 Unspecified asthma, uncomplicated: Secondary | ICD-10-CM | POA: Diagnosis not present

## 2018-04-22 DIAGNOSIS — M1990 Unspecified osteoarthritis, unspecified site: Secondary | ICD-10-CM | POA: Diagnosis not present

## 2018-04-22 DIAGNOSIS — I7300 Raynaud's syndrome without gangrene: Secondary | ICD-10-CM | POA: Diagnosis not present

## 2018-04-22 DIAGNOSIS — I10 Essential (primary) hypertension: Secondary | ICD-10-CM | POA: Diagnosis not present

## 2018-04-22 DIAGNOSIS — Z4682 Encounter for fitting and adjustment of non-vascular catheter: Secondary | ICD-10-CM | POA: Diagnosis not present

## 2018-04-22 DIAGNOSIS — R7303 Prediabetes: Secondary | ICD-10-CM | POA: Diagnosis not present

## 2018-04-22 DIAGNOSIS — C3491 Malignant neoplasm of unspecified part of right bronchus or lung: Secondary | ICD-10-CM | POA: Diagnosis not present

## 2018-04-22 DIAGNOSIS — R69 Illness, unspecified: Secondary | ICD-10-CM | POA: Diagnosis not present

## 2018-04-29 ENCOUNTER — Inpatient Hospital Stay: Payer: Medicare HMO | Attending: Internal Medicine

## 2018-04-29 DIAGNOSIS — R7303 Prediabetes: Secondary | ICD-10-CM | POA: Diagnosis not present

## 2018-04-29 DIAGNOSIS — J45909 Unspecified asthma, uncomplicated: Secondary | ICD-10-CM | POA: Diagnosis not present

## 2018-04-29 DIAGNOSIS — C3411 Malignant neoplasm of upper lobe, right bronchus or lung: Secondary | ICD-10-CM | POA: Insufficient documentation

## 2018-04-29 DIAGNOSIS — M1990 Unspecified osteoarthritis, unspecified site: Secondary | ICD-10-CM | POA: Diagnosis not present

## 2018-04-29 DIAGNOSIS — C3491 Malignant neoplasm of unspecified part of right bronchus or lung: Secondary | ICD-10-CM | POA: Diagnosis not present

## 2018-04-29 DIAGNOSIS — I10 Essential (primary) hypertension: Secondary | ICD-10-CM | POA: Diagnosis not present

## 2018-04-29 DIAGNOSIS — R69 Illness, unspecified: Secondary | ICD-10-CM | POA: Diagnosis not present

## 2018-04-29 DIAGNOSIS — J910 Malignant pleural effusion: Secondary | ICD-10-CM | POA: Insufficient documentation

## 2018-04-29 DIAGNOSIS — Z4682 Encounter for fitting and adjustment of non-vascular catheter: Secondary | ICD-10-CM | POA: Diagnosis not present

## 2018-04-29 DIAGNOSIS — I7300 Raynaud's syndrome without gangrene: Secondary | ICD-10-CM | POA: Diagnosis not present

## 2018-04-29 LAB — CMP (CANCER CENTER ONLY)
ALT: 15 U/L (ref 0–44)
AST: 24 U/L (ref 15–41)
Albumin: 3.7 g/dL (ref 3.5–5.0)
Alkaline Phosphatase: 47 U/L (ref 38–126)
Anion gap: 7 (ref 5–15)
BUN: 16 mg/dL (ref 8–23)
CO2: 29 mmol/L (ref 22–32)
Calcium: 9.4 mg/dL (ref 8.9–10.3)
Chloride: 106 mmol/L (ref 98–111)
Creatinine: 1.04 mg/dL — ABNORMAL HIGH (ref 0.44–1.00)
GFR, Est AFR Am: >60 mL/min (ref >60)
GFR, Estimated: 54 mL/min — ABNORMAL LOW (ref >60)
Glucose, Bld: 61 mg/dL — ABNORMAL LOW (ref 70–99)
Potassium: 3.2 mmol/L — ABNORMAL LOW (ref 3.5–5.1)
Sodium: 142 mmol/L (ref 135–145)
Total Bilirubin: 0.3 mg/dL (ref 0.3–1.2)
Total Protein: 7.3 g/dL (ref 6.5–8.1)

## 2018-04-29 LAB — CBC WITH DIFFERENTIAL (CANCER CENTER ONLY)
Abs Immature Granulocytes: 0.01 10*3/uL (ref 0.00–0.07)
Basophils Absolute: 0 10*3/uL (ref 0.0–0.1)
Basophils Relative: 1 %
Eosinophils Absolute: 0 10*3/uL (ref 0.0–0.5)
Eosinophils Relative: 1 %
HCT: 40.1 % (ref 36.0–46.0)
Hemoglobin: 12.3 g/dL (ref 12.0–15.0)
Immature Granulocytes: 0 %
Lymphocytes Relative: 41 %
Lymphs Abs: 1.5 10*3/uL (ref 0.7–4.0)
MCH: 25.6 pg — ABNORMAL LOW (ref 26.0–34.0)
MCHC: 30.7 g/dL (ref 30.0–36.0)
MCV: 83.4 fL (ref 80.0–100.0)
Monocytes Absolute: 0.4 10*3/uL (ref 0.1–1.0)
Monocytes Relative: 11 %
Neutro Abs: 1.7 10*3/uL (ref 1.7–7.7)
Neutrophils Relative %: 46 %
Platelet Count: 226 10*3/uL (ref 150–400)
RBC: 4.81 MIL/uL (ref 3.87–5.11)
RDW: 14.6 % (ref 11.5–15.5)
WBC Count: 3.7 10*3/uL — ABNORMAL LOW (ref 4.0–10.5)
nRBC: 0 % (ref 0.0–0.2)

## 2018-04-30 ENCOUNTER — Telehealth: Payer: Self-pay

## 2018-04-30 ENCOUNTER — Inpatient Hospital Stay: Payer: Medicare HMO | Admitting: Internal Medicine

## 2018-04-30 ENCOUNTER — Other Ambulatory Visit: Payer: Medicare HMO

## 2018-04-30 ENCOUNTER — Encounter: Payer: Self-pay | Admitting: Internal Medicine

## 2018-04-30 VITALS — BP 140/72 | HR 66 | Temp 98.6°F | Resp 20 | Ht 64.5 in | Wt 133.8 lb

## 2018-04-30 DIAGNOSIS — C3411 Malignant neoplasm of upper lobe, right bronchus or lung: Secondary | ICD-10-CM | POA: Diagnosis not present

## 2018-04-30 DIAGNOSIS — C3491 Malignant neoplasm of unspecified part of right bronchus or lung: Secondary | ICD-10-CM

## 2018-04-30 DIAGNOSIS — J910 Malignant pleural effusion: Secondary | ICD-10-CM | POA: Diagnosis not present

## 2018-04-30 DIAGNOSIS — J90 Pleural effusion, not elsewhere classified: Secondary | ICD-10-CM

## 2018-04-30 DIAGNOSIS — Z5111 Encounter for antineoplastic chemotherapy: Secondary | ICD-10-CM

## 2018-04-30 NOTE — Progress Notes (Signed)
[place] Telephone:([phone]   Fax:(336) [phone]  OFFICE PROGRESS NOTE  [name], [place] [address]  DIAGNOSIS: Stage IV (T2 a,N2, M1a) non-small cell lung cancer, adenocarcinoma diagnosed in July 2019 and presented with right upper lobe lung mass in addition to mediastinal lymphadenopathy as well as bilateral pulmonary nodules and malignant right pleural effusion.  Biomarker Findings Microsatellite status - Cannot Be Determined Tumor Mutational Burden - Cannot Be Determined Genomic Findings For a complete list of the genes assayed, please refer to the Appendix. EGFR exon 19 deletion ([id number]) TP53 Y220C 7 Disease relevant genes with no reportable alterations: KRAS, ALK, BRAF, MET, RET, ERBB2, ROS1   PRIOR THERAPY: Status post right Pleurx catheter placement by Dr. Prescott Gum for drainage of malignant right pleural effusion.  CURRENT THERAPY: Tagrisso 80 mg p.o. daily.  First dose was given on 01/29/2018.  Status post 3 months of treatment.  INTERVAL HISTORY: Allison [name] 67 y.o. female returns to the clinic today for follow-up visit accompanied by her boyfriend.  The patient is feeling fine today with no concerning complaints except for mild fatigue.  She continues to have drainage from the right Pleurx catheter around 200 cc 2-3 times a week.  She denied having any chest pain, shortness of breath, cough or hemoptysis.  She denied having any weight loss or night sweats.  She has no nausea, vomiting, diarrhea or constipation.  She denied having any skin rash.  She is here today for evaluation and repeat blood work.  MEDICAL HISTORY: Past Medical History:  Diagnosis Date  . Anxiety   . Arthritis   . Asthma    exercise induced  . Depression    PMH  . GERD (gastroesophageal reflux disease)   . Glaucoma   . Hypertension   . Malignant pleural effusion    right  . PONV (postoperative nausea and vomiting)    . Pre-diabetes   . Raynaud's disease   . Raynaud's disease     ALLERGIES:  is allergic to penicillins and vicodin [hydrocodone-acetaminophen].  MEDICATIONS:  Current Outpatient Medications  Medication Sig Dispense Refill  . ALPRAZolam (XANAX) 0.25 MG tablet Take 0.26m by mouth once daily as needed for anxiety    . amLODipine (NORVASC) 5 MG tablet Take 5 mg by mouth daily.  3  . brimonidine (ALPHAGAN P) 0.1 % SOLN Instill 1 drop in both eye twice daily    . dorzolamide-timolol (COSOPT) 22.3-6.8 MG/ML ophthalmic solution Instill 1 drop in both eyes twice daily    . fenofibrate 160 MG tablet Take 160 mg by mouth daily.    . hydrochlorothiazide (HYDRODIURIL) 25 MG tablet Take 25 mg by mouth daily.  4  . latanoprost (XALATAN) 0.005 % ophthalmic solution Place 1 drop into both eyes at bedtime.     . Multiple Vitamins-Minerals (MULTIVITAMIN ADULT PO) Take 1 tablet by mouth daily.    . naproxen sodium (ALEVE) 220 MG tablet Take 220 mg by mouth 2 (two) times daily as needed.    .Marland Kitchenosimertinib mesylate (TAGRISSO) 80 MG tablet Take 1 tablet (80 mg total) by mouth daily. Refill Faxed to [facility] 03/26/18 30 tablet 0  . traMADol (ULTRAM) 50 MG tablet Take 50 mg by mouth 2 (two) times daily as needed. for pain  0  . triamcinolone cream (KENALOG) 0.1 % Apply 1 application topically 2 (two) times daily. 30 g 0   No current facility-administered medications for this visit.  SURGICAL HISTORY:  Past Surgical History:  Procedure Laterality Date  . ABDOMINAL HYSTERECTOMY     partial  . CHEST TUBE INSERTION Right 01/01/2018   Procedure: INSERTION PLEURAL DRAINAGE CATHETER;  Surgeon: Ivin Poot, MD;  Location: [facility];  Service: Thoracic;  Laterality: Right;  . COLONOSCOPY    . DILATION AND CURETTAGE OF UTERUS    . EYE SURGERY     due to Glaucoma  . ROTATOR CUFF REPAIR    . TUBAL LIGATION    . WISDOM TOOTH EXTRACTION      REVIEW OF SYSTEMS:  A comprehensive review of systems was negative  except for: Integument/breast: positive for dryness   PHYSICAL EXAMINATION: General appearance: alert, cooperative and no distress Head: Normocephalic, without obvious abnormality, atraumatic Neck: no adenopathy, no JVD, supple, symmetrical, trachea midline and thyroid not enlarged, symmetric, no tenderness/mass/nodules Lymph nodes: Cervical, supraclavicular, and axillary nodes normal. Resp: clear to auscultation bilaterally Back: symmetric, no curvature. ROM normal. No CVA tenderness. Cardio: regular rate and rhythm, S1, S2 normal, no murmur, click, rub or gallop GI: soft, non-tender; bowel sounds normal; no masses,  no organomegaly Extremities: extremities normal, atraumatic, no cyanosis or edema  ECOG PERFORMANCE STATUS: 1 - Symptomatic but completely ambulatory  Blood pressure 140/72, pulse 66, temperature 98.6 F (37 C), temperature source Oral, resp. rate 20, height 5' 4.5" (1.638 m), weight 133 lb 12.8 oz (60.7 kg), SpO2 96 %.  LABORATORY DATA: Lab Results  Component Value Date   WBC 3.7 (L) 04/29/2018   HGB 12.3 04/29/2018   HCT 40.1 04/29/2018   MCV 83.4 04/29/2018   PLT 226 04/29/2018      Chemistry      Component Value Date/Time   NA 142 04/29/2018 1111   K 3.2 (L) 04/29/2018 1111   CL 106 04/29/2018 1111   CO2 29 04/29/2018 1111   BUN 16 04/29/2018 1111   CREATININE 1.04 (H) 04/29/2018 1111      Component Value Date/Time   CALCIUM 9.4 04/29/2018 1111   ALKPHOS 47 04/29/2018 1111   AST 24 04/29/2018 1111   ALT 15 04/29/2018 1111   BILITOT 0.3 04/29/2018 1111       RADIOGRAPHIC STUDIES: Dg Chest 2 View  Result Date: 04/17/2018 CLINICAL DATA:  Acute onset of right posterior chest pain. Known right lung adenocarcinoma with chest tube placement. EXAM: CHEST - 2 VIEW COMPARISON:  Chest radiograph performed 03/13/2018, and CT of the chest performed 03/29/2018 FINDINGS: A small right-sided pleural effusion appears mildly increased from the prior study. The right  apical chest tube is grossly unremarkable in appearance, slightly retracted from the prior study. The patient's right apical mass is grossly unchanged in appearance. Known nodular disease at the right lung base is difficult to fully characterize on radiograph. The left lung appears grossly clear. The heart is normal in size. No acute osseous abnormalities are identified. No definite pneumothorax seen. IMPRESSION: 1. Small right-sided pleural effusion appears mildly increased from the prior study. Right apical chest tube is grossly unremarkable in appearance, slightly retracted from the prior study. 2. Right apical mass is grossly unchanged in appearance. Electronically Signed   By: Garald Balding M.D.   On: 04/17/2018 09:23    ASSESSMENT AND PLAN: This is a very pleasant 67 years old never smoker African-American female recently with a stage IV non-small cell lung cancer, adenocarcinoma with positive EGFR mutation with deletion in exon 19. The patient was started on treatment with Tagrisso 80 mg p.o. daily status  post 3 months of treatment and has been tolerating this treatment fairly well. I recommended for the patient to continue her current treatment with Tagrisso with the same dose. I will see her back for follow-up visit in 1 months for evaluation with repeat blood work. For the recurrent right pleural effusion, the patient will continue the drainage via the Pleurx catheter and she is followed by Dr. Prescott Gum. The patient was advised to call immediately if she has any concerning symptoms in the interval. The patient voices understanding of current disease status and treatment options and is in agreement with the current care plan.  All questions were answered. The patient knows to call the clinic with any problems, questions or concerns. We can certainly see the patient much sooner if necessary.  Disclaimer: This note was dictated with voice recognition software. Similar sounding words can  inadvertently be transcribed and may not be corrected upon review.

## 2018-04-30 NOTE — Telephone Encounter (Signed)
Printed avs and calender of upcoming appointment. Per 11/12 los

## 2018-05-02 ENCOUNTER — Other Ambulatory Visit: Payer: Self-pay | Admitting: Medical Oncology

## 2018-05-02 DIAGNOSIS — C3491 Malignant neoplasm of unspecified part of right bronchus or lung: Secondary | ICD-10-CM

## 2018-05-02 MED ORDER — OSIMERTINIB MESYLATE 80 MG PO TABS: 80.0000 mg | ORAL_TABLET | Freq: Every day | ORAL | 3 refills | Status: DC

## 2018-05-04 DIAGNOSIS — J45909 Unspecified asthma, uncomplicated: Secondary | ICD-10-CM | POA: Diagnosis not present

## 2018-05-04 DIAGNOSIS — R7303 Prediabetes: Secondary | ICD-10-CM | POA: Diagnosis not present

## 2018-05-04 DIAGNOSIS — I7300 Raynaud's syndrome without gangrene: Secondary | ICD-10-CM | POA: Diagnosis not present

## 2018-05-04 DIAGNOSIS — R69 Illness, unspecified: Secondary | ICD-10-CM | POA: Diagnosis not present

## 2018-05-04 DIAGNOSIS — C3491 Malignant neoplasm of unspecified part of right bronchus or lung: Secondary | ICD-10-CM | POA: Diagnosis not present

## 2018-05-04 DIAGNOSIS — J910 Malignant pleural effusion: Secondary | ICD-10-CM | POA: Diagnosis not present

## 2018-05-04 DIAGNOSIS — Z4682 Encounter for fitting and adjustment of non-vascular catheter: Secondary | ICD-10-CM | POA: Diagnosis not present

## 2018-05-04 DIAGNOSIS — I10 Essential (primary) hypertension: Secondary | ICD-10-CM | POA: Diagnosis not present

## 2018-05-04 DIAGNOSIS — M1991 Primary osteoarthritis, unspecified site: Secondary | ICD-10-CM | POA: Diagnosis not present

## 2018-05-06 DIAGNOSIS — I10 Essential (primary) hypertension: Secondary | ICD-10-CM | POA: Diagnosis not present

## 2018-05-06 DIAGNOSIS — J910 Malignant pleural effusion: Secondary | ICD-10-CM | POA: Diagnosis not present

## 2018-05-06 DIAGNOSIS — Z4682 Encounter for fitting and adjustment of non-vascular catheter: Secondary | ICD-10-CM | POA: Diagnosis not present

## 2018-05-06 DIAGNOSIS — R7303 Prediabetes: Secondary | ICD-10-CM | POA: Diagnosis not present

## 2018-05-06 DIAGNOSIS — I7300 Raynaud's syndrome without gangrene: Secondary | ICD-10-CM | POA: Diagnosis not present

## 2018-05-06 DIAGNOSIS — J45909 Unspecified asthma, uncomplicated: Secondary | ICD-10-CM | POA: Diagnosis not present

## 2018-05-06 DIAGNOSIS — C3491 Malignant neoplasm of unspecified part of right bronchus or lung: Secondary | ICD-10-CM | POA: Diagnosis not present

## 2018-05-06 DIAGNOSIS — M1991 Primary osteoarthritis, unspecified site: Secondary | ICD-10-CM | POA: Diagnosis not present

## 2018-05-06 DIAGNOSIS — R69 Illness, unspecified: Secondary | ICD-10-CM | POA: Diagnosis not present

## 2018-05-07 DIAGNOSIS — C50919 Malignant neoplasm of unspecified site of unspecified female breast: Secondary | ICD-10-CM | POA: Diagnosis not present

## 2018-05-07 DIAGNOSIS — J910 Malignant pleural effusion: Secondary | ICD-10-CM | POA: Diagnosis not present

## 2018-05-07 DIAGNOSIS — C7800 Secondary malignant neoplasm of unspecified lung: Secondary | ICD-10-CM | POA: Diagnosis not present

## 2018-05-07 DIAGNOSIS — J918 Pleural effusion in other conditions classified elsewhere: Secondary | ICD-10-CM | POA: Diagnosis not present

## 2018-05-13 DIAGNOSIS — M1991 Primary osteoarthritis, unspecified site: Secondary | ICD-10-CM | POA: Diagnosis not present

## 2018-05-13 DIAGNOSIS — R7303 Prediabetes: Secondary | ICD-10-CM | POA: Diagnosis not present

## 2018-05-13 DIAGNOSIS — J45909 Unspecified asthma, uncomplicated: Secondary | ICD-10-CM | POA: Diagnosis not present

## 2018-05-13 DIAGNOSIS — I10 Essential (primary) hypertension: Secondary | ICD-10-CM | POA: Diagnosis not present

## 2018-05-13 DIAGNOSIS — C3491 Malignant neoplasm of unspecified part of right bronchus or lung: Secondary | ICD-10-CM | POA: Diagnosis not present

## 2018-05-13 DIAGNOSIS — J910 Malignant pleural effusion: Secondary | ICD-10-CM | POA: Diagnosis not present

## 2018-05-13 DIAGNOSIS — I7300 Raynaud's syndrome without gangrene: Secondary | ICD-10-CM | POA: Diagnosis not present

## 2018-05-13 DIAGNOSIS — Z4682 Encounter for fitting and adjustment of non-vascular catheter: Secondary | ICD-10-CM | POA: Diagnosis not present

## 2018-05-13 DIAGNOSIS — R69 Illness, unspecified: Secondary | ICD-10-CM | POA: Diagnosis not present

## 2018-05-20 ENCOUNTER — Telehealth: Payer: Self-pay

## 2018-05-20 DIAGNOSIS — Z4682 Encounter for fitting and adjustment of non-vascular catheter: Secondary | ICD-10-CM | POA: Diagnosis not present

## 2018-05-20 DIAGNOSIS — I10 Essential (primary) hypertension: Secondary | ICD-10-CM | POA: Diagnosis not present

## 2018-05-20 DIAGNOSIS — C3491 Malignant neoplasm of unspecified part of right bronchus or lung: Secondary | ICD-10-CM | POA: Diagnosis not present

## 2018-05-20 DIAGNOSIS — M1991 Primary osteoarthritis, unspecified site: Secondary | ICD-10-CM | POA: Diagnosis not present

## 2018-05-20 DIAGNOSIS — R7303 Prediabetes: Secondary | ICD-10-CM | POA: Diagnosis not present

## 2018-05-20 DIAGNOSIS — R69 Illness, unspecified: Secondary | ICD-10-CM | POA: Diagnosis not present

## 2018-05-20 DIAGNOSIS — J45909 Unspecified asthma, uncomplicated: Secondary | ICD-10-CM | POA: Diagnosis not present

## 2018-05-20 DIAGNOSIS — I7300 Raynaud's syndrome without gangrene: Secondary | ICD-10-CM | POA: Diagnosis not present

## 2018-05-20 DIAGNOSIS — J910 Malignant pleural effusion: Secondary | ICD-10-CM | POA: Diagnosis not present

## 2018-05-20 NOTE — Telephone Encounter (Signed)
Allison [name] contacted the office and stated that she has developed a dry cough and a scratchy throat.  She requested to have an "xray done of her throat to check to see if anything is going on".  I advised that an xray would not be the best action to determine the reason for her symptoms.  I advised her to contact her PCP to be evaluated as she did state at night she did have some nasal congestion but did not interfere with her sleeping.  She acknowledged receipt.  She also stated she has been draining her PleurX on Mondays and Thursdays and is getting anywhere from 150 ml's to 200 ml's of pleural fluid out.  She has an appointment to see Dr. Prescott Gum this Wednesday which she is aware of.

## 2018-05-21 ENCOUNTER — Telehealth: Payer: Self-pay | Admitting: Pharmacist

## 2018-05-21 ENCOUNTER — Other Ambulatory Visit: Payer: Self-pay | Admitting: Cardiothoracic Surgery

## 2018-05-21 ENCOUNTER — Telehealth: Payer: Self-pay

## 2018-05-21 DIAGNOSIS — C3491 Malignant neoplasm of unspecified part of right bronchus or lung: Secondary | ICD-10-CM

## 2018-05-21 NOTE — Telephone Encounter (Signed)
Oral Chemotherapy Pharmacist Encounter  Follow-Up Form  Spoke with patient today to follow up regarding patient's oral chemotherapy medication: Tagrisso (osimertinib) for the treatment of newly diagnosed, metastatic non small cell lung cancer, EGFR mutation positive (exon 19 deletion), planned duration until disease progression or unacceptable toxicity.  Original Start date of oral chemotherapy: 01/29/2018  Pt reports 0 tablets/doses of Tagrisso 80 tablets, 1 tablet by mouth once daily, without regard to food missed in the last month.  Pt reports the following side effects:   Persistent dry skin on her nose, manageable with moisturizing cream  Extremely dry hands with skin cracking on her thumb and fore fingers that open and close intermittently, manageable  Patient states she stays cold during the day and experiences hot flashes at night, which are new since starting on the Tagrisso, manageable  Pertinent labs reviewed: Okay for continued treatment.  Other Issues:  We talked through persistent pleural effusion for which she still drains 100 to 200 mL's twice weekly.  Latest CT scan does show regression of primary lung mass and some regression of pleural effusion.  Patient's disease is stable without evidence of progression or new sites of metastasis.  Patient states she is at least thankful for that. Patient also with questions about ordering her next fill of Tagrisso from [facility] and me prescription savings program.  Patient informed that the issues with long wait times and refill process appears to have been resolved.  She will call them today in order her next fill.  Patient transferred to oral oncology patient advocate to discussed automatic [place] with [place] and [place] for the 2020 calendar year.  Patient knows to call the office with questions or concerns. Oral Oncology Clinic will continue to follow.  Johny Drilling, PharmD, BCPS, BCOP  05/21/2018 10:36 AM Oral  Oncology Clinic [phone]

## 2018-05-21 NOTE — Telephone Encounter (Signed)
Oral Oncology Patient Advocate Encounter  [place] manufacturer assistance will expire 06/18/18. Ms. Mcbain received a letter from [employer] stating that she would be automatically re-enrolled into the program effective 06/19/18-06/19/19.   Ms. Vine called to talk about this and I explained how this worked and she verbalized understanding.  [facility] Patient [place] Phone [phone] Fax [phone]

## 2018-05-22 ENCOUNTER — Ambulatory Visit: Payer: Medicare HMO | Admitting: Cardiothoracic Surgery

## 2018-05-22 ENCOUNTER — Ambulatory Visit
Admission: RE | Admit: 2018-05-22 | Discharge: 2018-05-22 | Disposition: A | Discharge status: Home / Self Care | Payer: Medicare HMO | Source: Ambulatory Visit | Attending: Cardiothoracic Surgery | Admitting: Cardiothoracic Surgery

## 2018-05-22 ENCOUNTER — Encounter: Payer: Self-pay | Admitting: Cardiothoracic Surgery

## 2018-05-22 VITALS — BP 154/84 | HR 78 | Resp 16 | Ht 64.5 in | Wt 133.0 lb

## 2018-05-22 DIAGNOSIS — J910 Malignant pleural effusion: Secondary | ICD-10-CM

## 2018-05-22 DIAGNOSIS — C3491 Malignant neoplasm of unspecified part of right bronchus or lung: Secondary | ICD-10-CM

## 2018-05-22 DIAGNOSIS — Z9689 Presence of other specified functional implants: Secondary | ICD-10-CM

## 2018-05-22 NOTE — Progress Notes (Signed)
PCP is [name], Kimberly, MD Referring Provider is [name], Kimberly, MD  Chief Complaint  Patient presents with  . Pleural Effusion    1 month f/u to assess R malignant pl eff / pleurX    HPI: Patient returns for right Pleurx catheter check.  This was placed for stage IV adenocarcinoma with malignant right pleural effusion.  It is a EGFR positive tumor and she is being treated with  Tagrisso.  Her scan last month showed significant decrease in size of her right upper lobe mass.  Her pleural drainage remains proximately 200 cc per session.  She drains twice a week.  She denies shortness of breath.  When the volume reduces to 150 cc for 3 sessions she will reduce drainage to once a week.  The catheter is in good position on x-ray today.  There is a small probably loculated effusion medially.  Catheter site is examined today and is clean and dry.  New dressing was applied.   Past Medical History:  Diagnosis Date  . Anxiety   . Arthritis   . Asthma    exercise induced  . Depression    PMH  . GERD (gastroesophageal reflux disease)   . Glaucoma   . Hypertension   . Malignant pleural effusion    right  . PONV (postoperative nausea and vomiting)   . Pre-diabetes   . Raynaud's disease   . Raynaud's disease     Past Surgical History:  Procedure Laterality Date  . ABDOMINAL HYSTERECTOMY     partial  . CHEST TUBE INSERTION Right 01/01/2018   Procedure: INSERTION PLEURAL DRAINAGE CATHETER;  Surgeon: Van Trigt, Peter, MD;  Location: [facility];  Service: Thoracic;  Laterality: Right;  . COLONOSCOPY    . DILATION AND CURETTAGE OF UTERUS    . EYE SURGERY     due to Glaucoma  . ROTATOR CUFF REPAIR    . TUBAL LIGATION    . WISDOM TOOTH EXTRACTION      Family History  Problem Relation Age of Onset  . Heart disease Sister   . Heart disease Brother   . Lung cancer Other     Social History Social History   Tobacco Use  . Smoking status: Never Smoker  . Smokeless tobacco: Never Used   Substance Use Topics  . Alcohol use: Yes    Comment: up to 3 drinks per week  . Drug use: No    Comment: CBD oil     Current Outpatient Medications  Medication Sig Dispense Refill  . ALPRAZolam (XANAX) 0.25 MG tablet Take 0.25mg by mouth once daily as needed for anxiety    . amLODipine (NORVASC) 5 MG tablet Take 5 mg by mouth daily.  3  . brimonidine (ALPHAGAN P) 0.1 % SOLN Instill 1 drop in both eye twice daily    . dorzolamide-timolol (COSOPT) 22.3-6.8 MG/ML ophthalmic solution Instill 1 drop in both eyes twice daily    . fenofibrate 160 MG tablet Take 160 mg by mouth daily.    . hydrochlorothiazide (HYDRODIURIL) 25 MG tablet Take 25 mg by mouth daily.  4  . latanoprost (XALATAN) 0.005 % ophthalmic solution Place 1 drop into both eyes at bedtime.     . Multiple Vitamins-Minerals (MULTIVITAMIN ADULT PO) Take 1 tablet by mouth daily.    . naproxen sodium (ALEVE) 220 MG tablet Take 220 mg by mouth 2 (two) times daily as needed.    . osimertinib mesylate (TAGRISSO) 80 MG tablet Take 1 tablet (80 mg   total) by mouth daily. Refill Faxed to [facility] 03/26/18 30 tablet 3  . traMADol (ULTRAM) 50 MG tablet Take 50 mg by mouth 2 (two) times daily as needed. for pain  0  . triamcinolone cream (KENALOG) 0.1 % Apply 1 application topically 2 (two) times daily. 30 g 0   No current facility-administered medications for this visit.     Allergies  Allergen Reactions  . Penicillins Other (See Comments)    SYNCOPE PATIENT HAS HAD A PCN REACTION WITH IMMEDIATE RASH, FACIAL/TONGUE/THROAT SWELLING, SOB, OR LIGHTHEADEDNESS WITH HYPOTENSION:  #  #  YES  #  # Has patient had a PCN reaction causing severe rash involving mucus membranes or skin necrosis: No Has patient had a PCN reaction that required hospitalization: No Has patient had a PCN reaction occurring within the last 10 years: No   . Vicodin [Hydrocodone-Acetaminophen] Other (See Comments)    Sped up heart and breathing    Review of Systems   Starting to gain weight She becomes winded when walking up stairs No fever Currently having nasal congestion and dry cough   BP (!) 154/84 (BP Location: Right Arm, Patient Position: Sitting, Cuff Size: Normal)   Pulse 78   Resp 16   Ht 5' 4.5" (1.638 m)   Wt 133 lb (60.3 kg)   SpO2 97% Comment: ON RA  BMI 22.48 kg/m  Physical Exam Slight diminished breath sounds on the right base Heart rate regular No cervical adenopathy Abdomen soft Neuro intact  Diagnostic Tests: Chest x-ray personally reviewed showing catheter in good position with small medially along the mediastinum.  Impression: Pleurx catheter functioning adequately and patient appears to be responding to targeted chemotherapy.  I suspect the effusion will start to decrease soon.  Plan: Return in 1 month with chest x-ray.   Peter Van Trigt III, MD [facility] [phone] 

## 2018-05-27 DIAGNOSIS — I10 Essential (primary) hypertension: Secondary | ICD-10-CM | POA: Diagnosis not present

## 2018-05-27 DIAGNOSIS — C3491 Malignant neoplasm of unspecified part of right bronchus or lung: Secondary | ICD-10-CM | POA: Diagnosis not present

## 2018-05-27 DIAGNOSIS — Z0001 Encounter for general adult medical examination with abnormal findings: Secondary | ICD-10-CM | POA: Diagnosis not present

## 2018-05-27 DIAGNOSIS — E785 Hyperlipidemia, unspecified: Secondary | ICD-10-CM | POA: Diagnosis not present

## 2018-05-31 DIAGNOSIS — C50919 Malignant neoplasm of unspecified site of unspecified female breast: Secondary | ICD-10-CM | POA: Diagnosis not present

## 2018-05-31 DIAGNOSIS — C7800 Secondary malignant neoplasm of unspecified lung: Secondary | ICD-10-CM | POA: Diagnosis not present

## 2018-05-31 DIAGNOSIS — J910 Malignant pleural effusion: Secondary | ICD-10-CM | POA: Diagnosis not present

## 2018-05-31 DIAGNOSIS — J918 Pleural effusion in other conditions classified elsewhere: Secondary | ICD-10-CM | POA: Diagnosis not present

## 2018-06-03 ENCOUNTER — Encounter: Payer: Self-pay | Admitting: Internal Medicine

## 2018-06-03 ENCOUNTER — Inpatient Hospital Stay: Payer: Medicare HMO | Attending: Internal Medicine

## 2018-06-03 ENCOUNTER — Telehealth: Payer: Self-pay

## 2018-06-03 ENCOUNTER — Inpatient Hospital Stay (HOSPITAL_BASED_OUTPATIENT_CLINIC_OR_DEPARTMENT_OTHER): Payer: Medicare HMO | Admitting: Internal Medicine

## 2018-06-03 VITALS — BP 158/78 | HR 63 | Temp 98.0°F | Resp 17 | Wt 131.1 lb

## 2018-06-03 DIAGNOSIS — I10 Essential (primary) hypertension: Secondary | ICD-10-CM | POA: Insufficient documentation

## 2018-06-03 DIAGNOSIS — J45909 Unspecified asthma, uncomplicated: Secondary | ICD-10-CM | POA: Diagnosis not present

## 2018-06-03 DIAGNOSIS — R7303 Prediabetes: Secondary | ICD-10-CM | POA: Diagnosis not present

## 2018-06-03 DIAGNOSIS — C3411 Malignant neoplasm of upper lobe, right bronchus or lung: Secondary | ICD-10-CM

## 2018-06-03 DIAGNOSIS — J910 Malignant pleural effusion: Secondary | ICD-10-CM | POA: Diagnosis not present

## 2018-06-03 DIAGNOSIS — C3491 Malignant neoplasm of unspecified part of right bronchus or lung: Secondary | ICD-10-CM

## 2018-06-03 DIAGNOSIS — M1991 Primary osteoarthritis, unspecified site: Secondary | ICD-10-CM | POA: Diagnosis not present

## 2018-06-03 DIAGNOSIS — Z5111 Encounter for antineoplastic chemotherapy: Secondary | ICD-10-CM

## 2018-06-03 DIAGNOSIS — J90 Pleural effusion, not elsewhere classified: Secondary | ICD-10-CM

## 2018-06-03 DIAGNOSIS — Z4682 Encounter for fitting and adjustment of non-vascular catheter: Secondary | ICD-10-CM | POA: Diagnosis not present

## 2018-06-03 DIAGNOSIS — C3490 Malignant neoplasm of unspecified part of unspecified bronchus or lung: Secondary | ICD-10-CM

## 2018-06-03 DIAGNOSIS — I7300 Raynaud's syndrome without gangrene: Secondary | ICD-10-CM | POA: Diagnosis not present

## 2018-06-03 DIAGNOSIS — R69 Illness, unspecified: Secondary | ICD-10-CM | POA: Diagnosis not present

## 2018-06-03 LAB — CMP (CANCER CENTER ONLY)
ALT: 17 U/L (ref 0–44)
AST: 22 U/L (ref 15–41)
Albumin: 3.7 g/dL (ref 3.5–5.0)
Alkaline Phosphatase: 49 U/L (ref 38–126)
Anion gap: 10 (ref 5–15)
BUN: 12 mg/dL (ref 8–23)
CO2: 26 mmol/L (ref 22–32)
Calcium: 9.8 mg/dL (ref 8.9–10.3)
Chloride: 107 mmol/L (ref 98–111)
Creatinine: 1.07 mg/dL — ABNORMAL HIGH (ref 0.44–1.00)
GFR, Est AFR Am: >60 mL/min (ref >60)
GFR, Estimated: 54 mL/min — ABNORMAL LOW (ref >60)
Glucose, Bld: 100 mg/dL — ABNORMAL HIGH (ref 70–99)
Potassium: 4.2 mmol/L (ref 3.5–5.1)
Sodium: 143 mmol/L (ref 135–145)
Total Bilirubin: 0.3 mg/dL (ref 0.3–1.2)
Total Protein: 7.4 g/dL (ref 6.5–8.1)

## 2018-06-03 LAB — CBC WITH DIFFERENTIAL (CANCER CENTER ONLY)
Abs Immature Granulocytes: 0.01 10*3/uL (ref 0.00–0.07)
Basophils Absolute: 0 10*3/uL (ref 0.0–0.1)
Basophils Relative: 1 %
Eosinophils Absolute: 0.1 10*3/uL (ref 0.0–0.5)
Eosinophils Relative: 2 %
HCT: 39.2 % (ref 36.0–46.0)
Hemoglobin: 12.1 g/dL (ref 12.0–15.0)
Immature Granulocytes: 0 %
Lymphocytes Relative: 42 %
Lymphs Abs: 1.5 10*3/uL (ref 0.7–4.0)
MCH: 25.5 pg — ABNORMAL LOW (ref 26.0–34.0)
MCHC: 30.9 g/dL (ref 30.0–36.0)
MCV: 82.5 fL (ref 80.0–100.0)
Monocytes Absolute: 0.4 10*3/uL (ref 0.1–1.0)
Monocytes Relative: 11 %
Neutro Abs: 1.5 10*3/uL — ABNORMAL LOW (ref 1.7–7.7)
Neutrophils Relative %: 44 %
Platelet Count: 278 10*3/uL (ref 150–400)
RBC: 4.75 MIL/uL (ref 3.87–5.11)
RDW: 14.3 % (ref 11.5–15.5)
WBC Count: 3.5 10*3/uL — ABNORMAL LOW (ref 4.0–10.5)
nRBC: 0 % (ref 0.0–0.2)

## 2018-06-03 NOTE — Telephone Encounter (Signed)
Printed avs and calender of upcoming appointment. Per 12/16 los. Provided contrast and CT contact  information

## 2018-06-03 NOTE — Progress Notes (Signed)
Allison [name] Telephone:(336) [phone]   Fax:(336) [phone]  OFFICE PROGRESS NOTE  [name], [place] [address]  DIAGNOSIS: Stage IV (T2 a,N2, M1a) non-small cell lung cancer, adenocarcinoma diagnosed in July 2019 and presented with right upper lobe lung mass in addition to mediastinal lymphadenopathy as well as bilateral pulmonary nodules and malignant right pleural effusion.  Biomarker Findings Microsatellite status - Cannot Be Determined Tumor Mutational Burden - Cannot Be Determined Genomic Findings For a complete list of the genes assayed, please refer to the Appendix. EGFR exon 19 deletion ([id number]) TP53 Y220C 7 Disease relevant genes with no reportable alterations: KRAS, ALK, BRAF, MET, RET, ERBB2, ROS1   PRIOR THERAPY: Status post right Pleurx catheter placement by Dr. Prescott Gum for drainage of malignant right pleural effusion.  CURRENT THERAPY: Tagrisso 80 mg p.o. daily.  First dose was given on 01/29/2018.  Status post 4 months of treatment.  INTERVAL HISTORY: Allison [name] 67 y.o. female returns to the clinic today for follow-up visit accompanied by her boyfriend.  The patient is feeling fine today with no concerning complaints except for uncontrolled hypertension.  She takes her blood pressure medication as prescribed.  She denied having any current chest pain, shortness of breath, cough or hemoptysis.  She continues to have mild fatigue.  She has no nausea, vomiting, diarrhea or constipation.  She continues to tolerate her treatment with Tagrisso fairly well.  She is here today for evaluation and repeat blood work.  MEDICAL HISTORY: Past Medical History:  Diagnosis Date  . Anxiety   . Arthritis   . Asthma    exercise induced  . Depression    PMH  . GERD (gastroesophageal reflux disease)   . Glaucoma   . Hypertension   . Malignant pleural effusion    right  . PONV (postoperative nausea and vomiting)    . Pre-diabetes   . Raynaud's disease   . Raynaud's disease     ALLERGIES:  is allergic to penicillins and vicodin [hydrocodone-acetaminophen].  MEDICATIONS:  Current Outpatient Medications  Medication Sig Dispense Refill  . ALPRAZolam (XANAX) 0.25 MG tablet Take 0.65m by mouth once daily as needed for anxiety    . amLODipine (NORVASC) 5 MG tablet Take 5 mg by mouth daily.  3  . brimonidine (ALPHAGAN P) 0.1 % SOLN Instill 1 drop in both eye twice daily    . dorzolamide-timolol (COSOPT) 22.3-6.8 MG/ML ophthalmic solution Instill 1 drop in both eyes twice daily    . fenofibrate 160 MG tablet Take 160 mg by mouth daily.    . hydrochlorothiazide (HYDRODIURIL) 25 MG tablet Take 25 mg by mouth daily.  4  . latanoprost (XALATAN) 0.005 % ophthalmic solution Place 1 drop into both eyes at bedtime.     . Multiple Vitamins-Minerals (MULTIVITAMIN ADULT PO) Take 1 tablet by mouth daily.    . naproxen sodium (ALEVE) 220 MG tablet Take 220 mg by mouth 2 (two) times daily as needed.    .Marland Kitchenosimertinib mesylate (TAGRISSO) 80 MG tablet Take 1 tablet (80 mg total) by mouth daily. Refill Faxed to [facility] 03/26/18 30 tablet 3  . traMADol (ULTRAM) 50 MG tablet Take 50 mg by mouth 2 (two) times daily as needed. for pain  0  . triamcinolone cream (KENALOG) 0.1 % Apply 1 application topically 2 (two) times daily. 30 g 0   No current facility-administered medications for this visit.     SURGICAL HISTORY:  Past Surgical History:  Procedure Laterality Date  . ABDOMINAL HYSTERECTOMY     partial  . CHEST TUBE INSERTION Right 01/01/2018   Procedure: INSERTION PLEURAL DRAINAGE CATHETER;  Surgeon: Ivin Poot, MD;  Location: [facility];  Service: Thoracic;  Laterality: Right;  . COLONOSCOPY    . DILATION AND CURETTAGE OF UTERUS    . EYE SURGERY     due to Glaucoma  . ROTATOR CUFF REPAIR    . TUBAL LIGATION    . WISDOM TOOTH EXTRACTION      REVIEW OF SYSTEMS:  A comprehensive review of systems was negative  except for: Constitutional: positive for fatigue   PHYSICAL EXAMINATION: General appearance: alert, cooperative and no distress Head: Normocephalic, without obvious abnormality, atraumatic Neck: no adenopathy, no JVD, supple, symmetrical, trachea midline and thyroid not enlarged, symmetric, no tenderness/mass/nodules Lymph nodes: Cervical, supraclavicular, and axillary nodes normal. Resp: clear to auscultation bilaterally Back: symmetric, no curvature. ROM normal. No CVA tenderness. Cardio: regular rate and rhythm, S1, S2 normal, no murmur, click, rub or gallop GI: soft, non-tender; bowel sounds normal; no masses,  no organomegaly Extremities: extremities normal, atraumatic, no cyanosis or edema  ECOG PERFORMANCE STATUS: 1 - Symptomatic but completely ambulatory  Blood pressure (!) 158/78, pulse 63, temperature 98 F (36.7 C), temperature source Oral, resp. rate 17, weight 131 lb 1.6 oz (59.5 kg), SpO2 100 %.  LABORATORY DATA: Lab Results  Component Value Date   WBC 3.5 (L) 06/03/2018   HGB 12.1 06/03/2018   HCT 39.2 06/03/2018   MCV 82.5 06/03/2018   PLT 278 06/03/2018      Chemistry      Component Value Date/Time   NA 142 04/29/2018 1111   K 3.2 (L) 04/29/2018 1111   CL 106 04/29/2018 1111   CO2 29 04/29/2018 1111   BUN 16 04/29/2018 1111   CREATININE 1.04 (H) 04/29/2018 1111      Component Value Date/Time   CALCIUM 9.4 04/29/2018 1111   ALKPHOS 47 04/29/2018 1111   AST 24 04/29/2018 1111   ALT 15 04/29/2018 1111   BILITOT 0.3 04/29/2018 1111       RADIOGRAPHIC STUDIES: Dg Chest 2 View  Result Date: 05/22/2018 CLINICAL DATA:  Adenocarcinoma of right lung.  Chest tube insertion. EXAM: CHEST - 2 VIEW COMPARISON:  April 17, 2018 FINDINGS: The right apical mass is stable since April 17, 2018. A right chest tube is stable. No pneumothorax. Small right effusion with underlying atelectasis. Subtle mildly rounded density measuring 7 mm in the left apex. No other  interval changes. IMPRESSION: 1. The mass in the right upper lobe is stable. 2. Subtle rounded density in the lateral left upper lung, not seen previously. This is favored to represent confluence of shadows or a subtle infiltrate. Recommend attention on close follow-up. Alternately, a CT scan could further evaluate more immediately if clinically warranted. 3. Stable right chest tube with a right-sided pleural effusion and underlying atelectasis. No pneumothorax. Electronically Signed   By: Dorise Bullion III M.D   On: 05/22/2018 15:45    ASSESSMENT AND PLAN: This is a very pleasant 67 years old never smoker African-American female recently with a stage IV non-small cell lung cancer, adenocarcinoma with positive EGFR mutation with deletion in exon 19. The patient was started on treatment with Tagrisso 80 mg p.o. daily status post 4 months of treatment. The patient continues to tolerate this treatment well with no concerning adverse effects. I recommended for her to continue her current  treatment with Tagrisso with the same dose. I will see her back for follow-up visit in 1 month after repeating CT scan of the chest, abdomen and pelvis for restaging of her disease. The patient was advised to call immediately if she has any concerning symptoms in the interval. The patient voices understanding of current disease status and treatment options and is in agreement with the current care plan.  All questions were answered. The patient knows to call the clinic with any problems, questions or concerns. We can certainly see the patient much sooner if necessary.  Disclaimer: This note was dictated with voice recognition software. Similar sounding words can inadvertently be transcribed and may not be corrected upon review.

## 2018-06-04 ENCOUNTER — Telehealth: Payer: Self-pay | Admitting: Medical Oncology

## 2018-06-04 DIAGNOSIS — R7309 Other abnormal glucose: Secondary | ICD-10-CM | POA: Diagnosis not present

## 2018-06-04 DIAGNOSIS — E559 Vitamin D deficiency, unspecified: Secondary | ICD-10-CM | POA: Diagnosis not present

## 2018-06-04 DIAGNOSIS — I10 Essential (primary) hypertension: Secondary | ICD-10-CM | POA: Diagnosis not present

## 2018-06-04 DIAGNOSIS — H401112 Primary open-angle glaucoma, right eye, moderate stage: Secondary | ICD-10-CM | POA: Diagnosis not present

## 2018-06-04 DIAGNOSIS — G514 Facial myokymia: Secondary | ICD-10-CM | POA: Diagnosis not present

## 2018-06-04 DIAGNOSIS — E785 Hyperlipidemia, unspecified: Secondary | ICD-10-CM | POA: Diagnosis not present

## 2018-06-04 DIAGNOSIS — H401121 Primary open-angle glaucoma, left eye, mild stage: Secondary | ICD-10-CM | POA: Diagnosis not present

## 2018-06-04 NOTE — Telephone Encounter (Signed)
Low energy- Her pcp prescribed omega 3, 2000-3000 mg daily, vit c 2000-.3000/d and coq 10 -200-300/day Is this okay with Allison [name]?

## 2018-06-04 NOTE — Telephone Encounter (Signed)
She will need to discuss with nutrition and Johny Drilling to rule out any interactions.

## 2018-06-05 ENCOUNTER — Telehealth: Payer: Self-pay | Admitting: Pharmacist

## 2018-06-05 NOTE — Telephone Encounter (Signed)
Oral Oncology Pharmacist Encounter  Received notification from collaborative practice RN that patient had some questions about medication interactions with Tagrisso and some newly prescribed omega-3 fatty acids, high-dose vitamin C, and coenzyme-Q 10.  No interactions with Tagrisso.  Category C interaction with omega-3 fatty acids and naproxen: Omega-3 fatty acids may enhance the antiplatelet effect of agents with antiplatelet properties. Patient states she takes naproxen only intermittently. OK to continue both agents at that frequency. Platelet count well WNL. Patient advised of above.  She expressed understanding and appreciation. She knows to call the office with any additional questions or concerns.  Johny Drilling, PharmD, BCPS, BCOP  06/05/2018 11:48 AM Oral Oncology Clinic [phone]

## 2018-06-17 DIAGNOSIS — J910 Malignant pleural effusion: Secondary | ICD-10-CM | POA: Diagnosis not present

## 2018-06-17 DIAGNOSIS — C3491 Malignant neoplasm of unspecified part of right bronchus or lung: Secondary | ICD-10-CM | POA: Diagnosis not present

## 2018-06-17 DIAGNOSIS — Z4682 Encounter for fitting and adjustment of non-vascular catheter: Secondary | ICD-10-CM | POA: Diagnosis not present

## 2018-06-17 DIAGNOSIS — I10 Essential (primary) hypertension: Secondary | ICD-10-CM | POA: Diagnosis not present

## 2018-06-17 DIAGNOSIS — I7300 Raynaud's syndrome without gangrene: Secondary | ICD-10-CM | POA: Diagnosis not present

## 2018-06-17 DIAGNOSIS — M1991 Primary osteoarthritis, unspecified site: Secondary | ICD-10-CM | POA: Diagnosis not present

## 2018-06-17 DIAGNOSIS — J45909 Unspecified asthma, uncomplicated: Secondary | ICD-10-CM | POA: Diagnosis not present

## 2018-06-17 DIAGNOSIS — R7303 Prediabetes: Secondary | ICD-10-CM | POA: Diagnosis not present

## 2018-06-17 DIAGNOSIS — R69 Illness, unspecified: Secondary | ICD-10-CM | POA: Diagnosis not present

## 2018-06-21 ENCOUNTER — Other Ambulatory Visit: Payer: Medicare HMO

## 2018-06-24 ENCOUNTER — Ambulatory Visit: Payer: Medicare HMO | Admitting: Cardiothoracic Surgery

## 2018-06-24 ENCOUNTER — Ambulatory Visit
Admission: RE | Admit: 2018-06-24 | Discharge: 2018-06-24 | Disposition: A | Discharge status: Home / Self Care | Payer: Medicare HMO | Source: Ambulatory Visit | Attending: Cardiothoracic Surgery | Admitting: Cardiothoracic Surgery

## 2018-06-24 ENCOUNTER — Other Ambulatory Visit: Payer: Self-pay | Admitting: Thoracic Surgery (Cardiothoracic Vascular Surgery)

## 2018-06-24 ENCOUNTER — Encounter: Payer: Self-pay | Admitting: Cardiothoracic Surgery

## 2018-06-24 ENCOUNTER — Other Ambulatory Visit: Payer: Self-pay

## 2018-06-24 VITALS — BP 170/80 | HR 59 | Resp 18 | Ht 64.5 in | Wt 130.6 lb

## 2018-06-24 DIAGNOSIS — M1991 Primary osteoarthritis, unspecified site: Secondary | ICD-10-CM | POA: Diagnosis not present

## 2018-06-24 DIAGNOSIS — I10 Essential (primary) hypertension: Secondary | ICD-10-CM | POA: Diagnosis not present

## 2018-06-24 DIAGNOSIS — C3491 Malignant neoplasm of unspecified part of right bronchus or lung: Secondary | ICD-10-CM

## 2018-06-24 DIAGNOSIS — R69 Illness, unspecified: Secondary | ICD-10-CM | POA: Diagnosis not present

## 2018-06-24 DIAGNOSIS — Z9689 Presence of other specified functional implants: Secondary | ICD-10-CM | POA: Diagnosis not present

## 2018-06-24 DIAGNOSIS — Z452 Encounter for adjustment and management of vascular access device: Secondary | ICD-10-CM | POA: Diagnosis not present

## 2018-06-24 DIAGNOSIS — J910 Malignant pleural effusion: Secondary | ICD-10-CM | POA: Diagnosis not present

## 2018-06-24 DIAGNOSIS — Z4682 Encounter for fitting and adjustment of non-vascular catheter: Secondary | ICD-10-CM | POA: Diagnosis not present

## 2018-06-24 DIAGNOSIS — R7303 Prediabetes: Secondary | ICD-10-CM | POA: Diagnosis not present

## 2018-06-24 DIAGNOSIS — C3490 Malignant neoplasm of unspecified part of unspecified bronchus or lung: Secondary | ICD-10-CM | POA: Diagnosis not present

## 2018-06-24 DIAGNOSIS — I7300 Raynaud's syndrome without gangrene: Secondary | ICD-10-CM | POA: Diagnosis not present

## 2018-06-24 DIAGNOSIS — J45909 Unspecified asthma, uncomplicated: Secondary | ICD-10-CM | POA: Diagnosis not present

## 2018-06-24 NOTE — Progress Notes (Signed)
PCP is [name], MD Referring Provider is [name], MD  Chief Complaint  Patient presents with  . Pleural Effusion    f/u with chest xray    HPI: Scheduled monthly visit for right Pleurx catheter, adenocarcinoma the right upper lobe with malignant pleural effusion.  Patient taking Tagrisso and being followed by Dr. [name]. Last drainage volume was only 40 cc.  Chest x-ray today shows no significant reaccumulation of fluid.  If the patient has 3 consecutive drainage sessions of less than 100 cc she will decrease the schedule of drainage to once a week.  Patient has a CT scan of the chest scheduled in the next week prior to a follow-up with her oncologist Dr. [name].  Overall she feels well and is back going to the gym.  No shortness of breath.  No drainage from her catheter site.  Past Medical History:  Diagnosis Date  . Anxiety   . Arthritis   . Asthma    exercise induced  . Depression    PMH  . GERD (gastroesophageal reflux disease)   . Glaucoma   . Hypertension   . Malignant pleural effusion    right  . PONV (postoperative nausea and vomiting)   . Pre-diabetes   . Raynaud's disease   . Raynaud's disease     Past Surgical History:  Procedure Laterality Date  . ABDOMINAL HYSTERECTOMY     partial  . CHEST TUBE INSERTION Right 01/01/2018   Procedure: INSERTION PLEURAL DRAINAGE CATHETER;  Surgeon: Ivin Poot, MD;  Location: [facility];  Service: Thoracic;  Laterality: Right;  . COLONOSCOPY    . DILATION AND CURETTAGE OF UTERUS    . EYE SURGERY     due to Glaucoma  . ROTATOR CUFF REPAIR    . TUBAL LIGATION    . WISDOM TOOTH EXTRACTION      Family History  Problem Relation Age of Onset  . Heart disease Sister   . Heart disease Brother   . Lung cancer Other     Social History Social History   Tobacco Use  . Smoking status: Never Smoker  . Smokeless tobacco: Never Used  Substance Use Topics  . Alcohol use: Yes    Comment: up to 3 drinks  per week  . Drug use: No    Comment: CBD oil     Current Outpatient Medications  Medication Sig Dispense Refill  . ALPRAZolam (XANAX) 0.25 MG tablet Take 0.25mg  by mouth once daily as needed for anxiety    . amLODipine (NORVASC) 5 MG tablet Take 5 mg by mouth daily.  3  . dorzolamide-timolol (COSOPT) 22.3-6.8 MG/ML ophthalmic solution Instill 1 drop in both eyes twice daily    . fenofibrate 160 MG tablet Take 160 mg by mouth daily.    . hydrochlorothiazide (HYDRODIURIL) 25 MG tablet Take 25 mg by mouth daily.  4  . latanoprost (XALATAN) 0.005 % ophthalmic solution Place 1 drop into both eyes at bedtime.     . Multiple Vitamins-Minerals (MULTIVITAMIN ADULT PO) Take 1 tablet by mouth daily.    . naproxen sodium (ALEVE) 220 MG tablet Take 220 mg by mouth 2 (two) times daily as needed.    Marland Kitchen osimertinib mesylate (TAGRISSO) 80 MG tablet Take 1 tablet (80 mg total) by mouth daily. Refill Faxed to [employer] 03/26/18 30 tablet 3  . traMADol (ULTRAM) 50 MG tablet Take 50 mg by mouth 2 (two) times daily as needed. for pain  0  . triamcinolone cream (  KENALOG) 0.1 % Apply 1 application topically 2 (two) times daily. 30 g 0  . brimonidine (ALPHAGAN P) 0.1 % SOLN Instill 1 drop in both eye twice daily     No current facility-administered medications for this visit.     Allergies  Allergen Reactions  . Penicillins Other (See Comments)    SYNCOPE PATIENT HAS HAD A PCN REACTION WITH IMMEDIATE RASH, FACIAL/TONGUE/THROAT SWELLING, SOB, OR LIGHTHEADEDNESS WITH HYPOTENSION:  #  #  YES  #  # Has patient had a PCN reaction causing severe rash involving mucus membranes or skin necrosis: No Has patient had a PCN reaction that required hospitalization: No Has patient had a PCN reaction occurring within the last 10 years: No   . Vicodin [Hydrocodone-Acetaminophen] Other (See Comments)    Sped up heart and breathing    Review of Systems  Weight stable Has recovered from a recent viral illness, respiratory No  fever Improved strength now going to her fitness center  BP (!) 170/80 (BP Location: Right Arm, Patient Position: Sitting, Cuff Size: Normal)   Pulse (!) 59   Resp 18   Ht 5' 4.5" (1.638 m)   Wt 130 lb 9.6 oz (59.2 kg)   SpO2 99% Comment: RA  BMI 22.07 kg/m  Physical Exam Alert and comfortable Pleurx dressing dry Breath sounds clear and equal Heart rate regular  Diagnostic Tests: Chest x-ray performed today shows no significant right pleural effusion.  There is some elevation of right  hemidiaphragm and some probable atelectasis anteriorly  Impression: Stage IV adenocarcinoma of the right lung with effusion managed very effectively with Pleurx catheter.  Drainage volumes are decreasing and hopefully we will be able to remove the catheter in the next 4-6 weeks.  Plan: Return in 4 weeks with chest x-ray.  If drainage volume drops less than 100 cc 3 consecutive sessions reduce drainage to once a week on Mondays.   Len Childs, MD [facility] [phone]

## 2018-06-26 ENCOUNTER — Ambulatory Visit: Payer: Medicare HMO | Admitting: Cardiothoracic Surgery

## 2018-06-26 ENCOUNTER — Telehealth: Payer: Self-pay | Admitting: Pharmacist

## 2018-06-26 NOTE — Telephone Encounter (Signed)
Oral Oncology Pharmacist Encounter  Received call from patient this morning with concerns that she had not yet received her next fill of Tagrisso 80 mg tablets from the [employer] prescription savings program. Patient stated that she ordered her next fill on Monday (06/24/2018).  When she spoke to [facility] on Monday she was informed that prescription had already started processing the previous Friday (06/21/2018) as the pharmacy could tell it was time for her next fill.  Patient states that she spoke with [employer] yesterday afternoon to obtain [employer] tracking number for her prescription. She states she was provided her tracking number from last month. She states when she called [employer] to follow-up on tracking number they informed her that that package was delivered last month.  When she called [employer] again to report that they had given her an old tracking number, representative instructed patient to call [employer] again and to have them search for her package by her address. Patient did call [employer] and was informed that they could not locate packages in that manner.  I called [facility] this morning at [phone], as they are the dispensing pharmacy for the [employer] prescription savings program.  Representative stated that patient's prescription had not yet been shipped, but they anticipate it being shipped later today or tomorrow, and will deliver overnight to patient. Representative was not able to provide any information about the stage of prescription verification or tracking information for patient shipment.  I returned call to patient with information that I had received from the dispensing pharmacy. I provided telephone number to dispensing pharmacy to patient. I instructed patient to call dispensing pharmacy tomorrow morning to follow-up on status of prescription. She will reach back out to the office if prescription has not been shipped when she contacts the pharmacy tomorrow.  Patient  informed that there are no longer any copayment grant foundation monies available at this time.  Patient states she has 4 days of medication remaining.  She expressed appreciation for my efforts. She knows to call the office with any additional questions or concerns.  Johny Drilling, PharmD, BCPS, BCOP  06/26/2018 10:01 AM Oral Oncology Clinic [phone]

## 2018-06-27 NOTE — Telephone Encounter (Signed)
Oral Oncology Pharmacist Encounter  Received call from patient with update that she had received her next fill of Tagrisso today.  Patient also with questions about if alprazolam use negatively impacts her lung cancer status and medication quide she received with last fill of alprazolam states to tell doctor if you have liver, kidney, heart, or lung disease.  Reviewed package insert for alprazolam. There is no carcinogenic effect of alprazolam in rats even when administered at 150x recommended human dose for 2 years. Patient informed statement about lung disease is likely a generic statement on all patient leaflets distributed by her pharmacy.  Patient expressed understanding and appreciation. She knows to call the office with additional questions or concerns.  Johny Drilling, PharmD, BCPS, BCOP  06/27/2018 12:26 PM Oral Oncology Clinic [phone]

## 2018-06-28 ENCOUNTER — Encounter (HOSPITAL_COMMUNITY): Payer: Self-pay

## 2018-06-28 ENCOUNTER — Inpatient Hospital Stay: Payer: Medicare HMO | Attending: Internal Medicine

## 2018-06-28 ENCOUNTER — Ambulatory Visit (HOSPITAL_COMMUNITY)
Admission: RE | Admit: 2018-06-28 | Discharge: 2018-06-28 | Disposition: A | Discharge status: Home / Self Care | Payer: Medicare HMO | Source: Ambulatory Visit | Attending: Internal Medicine | Admitting: Internal Medicine

## 2018-06-28 DIAGNOSIS — C3411 Malignant neoplasm of upper lobe, right bronchus or lung: Secondary | ICD-10-CM | POA: Insufficient documentation

## 2018-06-28 DIAGNOSIS — Z85118 Personal history of other malignant neoplasm of bronchus and lung: Secondary | ICD-10-CM | POA: Diagnosis not present

## 2018-06-28 DIAGNOSIS — C3490 Malignant neoplasm of unspecified part of unspecified bronchus or lung: Secondary | ICD-10-CM | POA: Diagnosis present

## 2018-06-28 LAB — CBC WITH DIFFERENTIAL (CANCER CENTER ONLY)
Abs Immature Granulocytes: 0 10*3/uL (ref 0.00–0.07)
Basophils Absolute: 0 10*3/uL (ref 0.0–0.1)
Basophils Relative: 1 %
Eosinophils Absolute: 0 10*3/uL (ref 0.0–0.5)
Eosinophils Relative: 1 %
HCT: 39.4 % (ref 36.0–46.0)
Hemoglobin: 12.4 g/dL (ref 12.0–15.0)
Immature Granulocytes: 0 %
Lymphocytes Relative: 43 %
Lymphs Abs: 1.2 10*3/uL (ref 0.7–4.0)
MCH: 26.2 pg (ref 26.0–34.0)
MCHC: 31.5 g/dL (ref 30.0–36.0)
MCV: 83.3 fL (ref 80.0–100.0)
Monocytes Absolute: 0.3 10*3/uL (ref 0.1–1.0)
Monocytes Relative: 12 %
Neutro Abs: 1.2 10*3/uL — ABNORMAL LOW (ref 1.7–7.7)
Neutrophils Relative %: 43 %
Platelet Count: 266 10*3/uL (ref 150–400)
RBC: 4.73 MIL/uL (ref 3.87–5.11)
RDW: 13.8 % (ref 11.5–15.5)
WBC Count: 2.8 10*3/uL — ABNORMAL LOW (ref 4.0–10.5)
nRBC: 0 % (ref 0.0–0.2)

## 2018-06-28 LAB — CMP (CANCER CENTER ONLY)
ALT: 17 U/L (ref 0–44)
AST: 23 U/L (ref 15–41)
Albumin: 3.9 g/dL (ref 3.5–5.0)
Alkaline Phosphatase: 50 U/L (ref 38–126)
Anion gap: 10 (ref 5–15)
BUN: 14 mg/dL (ref 8–23)
CO2: 27 mmol/L (ref 22–32)
Calcium: 9.8 mg/dL (ref 8.9–10.3)
Chloride: 105 mmol/L (ref 98–111)
Creatinine: 1.05 mg/dL — ABNORMAL HIGH (ref 0.44–1.00)
GFR, Est AFR Am: >60 mL/min (ref >60)
GFR, Estimated: 55 mL/min — ABNORMAL LOW (ref >60)
Glucose, Bld: 102 mg/dL — ABNORMAL HIGH (ref 70–99)
Potassium: 3.9 mmol/L (ref 3.5–5.1)
Sodium: 142 mmol/L (ref 135–145)
Total Bilirubin: 0.3 mg/dL (ref 0.3–1.2)
Total Protein: 7.9 g/dL (ref 6.5–8.1)

## 2018-06-28 MED ORDER — IOHEXOL 300 MG/ML  SOLN
100.0000 mL | Freq: Once | INTRAMUSCULAR | Status: AC | PRN
  Administered 2018-06-28: 100 mL via INTRAVENOUS

## 2018-06-28 MED ORDER — SODIUM CHLORIDE (PF) 0.9 % IJ SOLN
INTRAMUSCULAR | Status: AC
  Filled 2018-06-28: qty 50

## 2018-07-01 DIAGNOSIS — I10 Essential (primary) hypertension: Secondary | ICD-10-CM | POA: Diagnosis not present

## 2018-07-01 DIAGNOSIS — M1991 Primary osteoarthritis, unspecified site: Secondary | ICD-10-CM | POA: Diagnosis not present

## 2018-07-01 DIAGNOSIS — J910 Malignant pleural effusion: Secondary | ICD-10-CM | POA: Diagnosis not present

## 2018-07-01 DIAGNOSIS — C3491 Malignant neoplasm of unspecified part of right bronchus or lung: Secondary | ICD-10-CM | POA: Diagnosis not present

## 2018-07-01 DIAGNOSIS — Z4682 Encounter for fitting and adjustment of non-vascular catheter: Secondary | ICD-10-CM | POA: Diagnosis not present

## 2018-07-01 DIAGNOSIS — R69 Illness, unspecified: Secondary | ICD-10-CM | POA: Diagnosis not present

## 2018-07-01 DIAGNOSIS — J45909 Unspecified asthma, uncomplicated: Secondary | ICD-10-CM | POA: Diagnosis not present

## 2018-07-01 DIAGNOSIS — R7303 Prediabetes: Secondary | ICD-10-CM | POA: Diagnosis not present

## 2018-07-01 DIAGNOSIS — I7300 Raynaud's syndrome without gangrene: Secondary | ICD-10-CM | POA: Diagnosis not present

## 2018-07-02 ENCOUNTER — Inpatient Hospital Stay: Payer: Medicare HMO | Admitting: Internal Medicine

## 2018-07-02 ENCOUNTER — Telehealth: Payer: Self-pay | Admitting: Internal Medicine

## 2018-07-02 ENCOUNTER — Encounter: Payer: Self-pay | Admitting: Internal Medicine

## 2018-07-02 VITALS — BP 134/70 | HR 68 | Temp 98.2°F | Resp 16 | Ht 64.5 in | Wt 131.5 lb

## 2018-07-02 DIAGNOSIS — C3411 Malignant neoplasm of upper lobe, right bronchus or lung: Secondary | ICD-10-CM | POA: Diagnosis not present

## 2018-07-02 DIAGNOSIS — J90 Pleural effusion, not elsewhere classified: Secondary | ICD-10-CM

## 2018-07-02 DIAGNOSIS — C3491 Malignant neoplasm of unspecified part of right bronchus or lung: Secondary | ICD-10-CM

## 2018-07-02 DIAGNOSIS — Z5111 Encounter for antineoplastic chemotherapy: Secondary | ICD-10-CM

## 2018-07-02 NOTE — Telephone Encounter (Signed)
Printed calendar and avs. °

## 2018-07-02 NOTE — Progress Notes (Signed)
Allison [name]:([phone]   [phone]  OFFICE PROGRESS NOTE  [name], [place] [address]  DIAGNOSIS: Stage IV (T2 a,N2, M1a) non-small cell lung cancer, adenocarcinoma diagnosed in July 2019 and presented with right upper lobe lung mass in addition to mediastinal lymphadenopathy as well as bilateral pulmonary nodules and malignant right pleural effusion.  Biomarker Findings Microsatellite status - Cannot Be Determined Tumor Mutational Burden - Cannot Be Determined Genomic Findings For a complete list of the genes assayed, please refer to the Appendix. EGFR exon 19 deletion ([id number]) TP53 Y220C 7 Disease relevant genes with no reportable alterations: KRAS, ALK, BRAF, MET, RET, ERBB2, ROS1   PRIOR THERAPY: Status post right Pleurx catheter placement by Dr. Prescott Gum for drainage of malignant right pleural effusion.  CURRENT THERAPY: Tagrisso 80 mg p.o. daily.  First dose was given on 01/29/2018.  Status post 5 months of treatment.  INTERVAL HISTORY: Allison [name] 68 y.o. female returns to the clinic today for follow-up visit accompanied by her boyfriend.  The patient is feeling fine today with no concerning complaints except for fatigue.  She continues to have drainage of the right Pleurx in the range of 50 to 200 mL few days a week.  She denied having any chest pain, shortness of breath, cough or hemoptysis.  She denied having any recent weight loss or night sweats.  She has no nausea, vomiting, diarrhea or constipation.  She has no headache or visual changes.  She is tolerating her treatment with Tagrisso fairly well.  She has no skin rash.  The patient had repeat CT scan of the chest, abdomen and pelvis performed recently and she is here for evaluation and discussion of her scan results.  MEDICAL HISTORY: Past Medical History:  Diagnosis Date  . Anxiety   . Arthritis   . Asthma    exercise  induced  . Depression    PMH  . GERD (gastroesophageal reflux disease)   . Glaucoma   . Hypertension   . Malignant pleural effusion    right  . PONV (postoperative nausea and vomiting)   . Pre-diabetes   . Raynaud's disease   . Raynaud's disease     ALLERGIES:  is allergic to penicillins and vicodin [hydrocodone-acetaminophen].  MEDICATIONS:  Current Outpatient Medications  Medication Sig Dispense Refill  . ALPRAZolam (XANAX) 0.25 MG tablet Take 0.60m by mouth once daily as needed for anxiety    . amLODipine (NORVASC) 5 MG tablet Take 5 mg by mouth daily.  3  . brimonidine (ALPHAGAN P) 0.1 % SOLN Instill 1 drop in both eye twice daily    . dorzolamide-timolol (COSOPT) 22.3-6.8 MG/ML ophthalmic solution Instill 1 drop in both eyes twice daily    . fenofibrate 160 MG tablet Take 160 mg by mouth daily.    . hydrochlorothiazide (HYDRODIURIL) 25 MG tablet Take 25 mg by mouth daily.  4  . latanoprost (XALATAN) 0.005 % ophthalmic solution Place 1 drop into both eyes at bedtime.     . Multiple Vitamins-Minerals (MULTIVITAMIN ADULT PO) Take 1 tablet by mouth daily.    . naproxen sodium (ALEVE) 220 MG tablet Take 220 mg by mouth 2 (two) times daily as needed.    .Marland Kitchenosimertinib mesylate (TAGRISSO) 80 MG tablet Take 1 tablet (80 mg total) by mouth daily. Refill Faxed to [facility] 03/26/18 30 tablet 3  . traMADol (ULTRAM) 50 MG tablet Take 50 mg by mouth  2 (two) times daily as needed. for pain  0  . triamcinolone cream (KENALOG) 0.1 % Apply 1 application topically 2 (two) times daily. 30 g 0   No current facility-administered medications for this visit.     SURGICAL HISTORY:  Past Surgical History:  Procedure Laterality Date  . ABDOMINAL HYSTERECTOMY     partial  . CHEST TUBE INSERTION Right 01/01/2018   Procedure: INSERTION PLEURAL DRAINAGE CATHETER;  Surgeon: Ivin Poot, MD;  Location: [place];  Service: Thoracic;  Laterality: Right;  . COLONOSCOPY    . DILATION AND CURETTAGE OF UTERUS     . EYE SURGERY     due to Glaucoma  . ROTATOR CUFF REPAIR    . TUBAL LIGATION    . WISDOM TOOTH EXTRACTION      REVIEW OF SYSTEMS:  Constitutional: positive for fatigue Eyes: negative Ears, nose, mouth, throat, and face: negative Respiratory: negative Cardiovascular: negative Gastrointestinal: negative Genitourinary:negative Integument/breast: negative Hematologic/lymphatic: negative Musculoskeletal:negative Neurological: negative Behavioral/Psych: negative Endocrine: negative Allergic/Immunologic: negative   PHYSICAL EXAMINATION: General appearance: alert, cooperative, fatigued and no distress Head: Normocephalic, without obvious abnormality, atraumatic Neck: no adenopathy, no JVD, supple, symmetrical, trachea midline and thyroid not enlarged, symmetric, no tenderness/mass/nodules Lymph nodes: Cervical, supraclavicular, and axillary nodes normal. Resp: clear to auscultation bilaterally Back: symmetric, no curvature. ROM normal. No CVA tenderness. Cardio: regular rate and rhythm, S1, S2 normal, no murmur, click, rub or gallop GI: soft, non-tender; bowel sounds normal; no masses,  no organomegaly Extremities: extremities normal, atraumatic, no cyanosis or edema Neurologic: Alert and oriented X 3, normal strength and tone. Normal symmetric reflexes. Normal coordination and gait  ECOG PERFORMANCE STATUS: 1 - Symptomatic but completely ambulatory  Blood pressure 134/70, pulse 68, temperature 98.2 F (36.8 C), temperature source Oral, resp. rate 16, height 5' 4.5" (1.638 m), weight 131 lb 8 oz (59.6 kg), SpO2 100 %.  LABORATORY DATA: Lab Results  Component Value Date   WBC 2.8 (L) 06/28/2018   HGB 12.4 06/28/2018   HCT 39.4 06/28/2018   MCV 83.3 06/28/2018   PLT 266 06/28/2018      Chemistry      Component Value Date/Time   NA 142 06/28/2018 0900   K 3.9 06/28/2018 0900   CL 105 06/28/2018 0900   CO2 27 06/28/2018 0900   BUN 14 06/28/2018 0900   CREATININE 1.05  (H) 06/28/2018 0900      Component Value Date/Time   CALCIUM 9.8 06/28/2018 0900   ALKPHOS 50 06/28/2018 0900   AST 23 06/28/2018 0900   ALT 17 06/28/2018 0900   BILITOT 0.3 06/28/2018 0900       RADIOGRAPHIC STUDIES: Dg Chest 2 View  Result Date: 06/24/2018 CLINICAL DATA:  Lung cancer, drainage catheter placement EXAM: CHEST - 2 VIEW COMPARISON:  05/22/2013 FINDINGS: Spiculated right apical pulmonary nodule. Small right pleural effusion. Right-sided chest tube with the tip directed towards the apex. No pneumothorax. Stable cardiomediastinal silhouette. No aggressive osseous lesion. IMPRESSION: 1. Spiculated right apical pulmonary nodule unchanged from the prior exam. Small right pleural effusion with a chest tube in place. Electronically Signed   By: Kathreen Devoid   On: 06/24/2018 13:16   Ct Chest W Contrast  Result Date: 06/28/2018 CLINICAL DATA:  History of lung cancer.  Follow-up exam. EXAM: CT CHEST, ABDOMEN, AND PELVIS WITH CONTRAST TECHNIQUE: Multidetector CT imaging of the chest, abdomen and pelvis was performed following the standard protocol during bolus administration of intravenous contrast. CONTRAST:  170m OMNIPAQUE IOHEXOL  300 MG/ML  SOLN COMPARISON:  CT CAP 03/29/2018 FINDINGS: CT CHEST FINDINGS Cardiovascular: Normal heart size. Trace fluid superior pericardial recess. Thoracic aortic vascular calcifications. Mediastinum/Nodes: No enlarged axillary, mediastinal or hilar lymphadenopathy. Normal appearance of the esophagus. Lungs/Pleura: Central airways are patent. Slight interval decrease in size of small right pleural effusion. Right PleurX catheter in place. Similar-appearing irregular mass right upper lobe measures 3.0 x 1.8 cm (image 33, series 5), previously 3.0 x 1.9 cm. Re demonstrated thickening along the right minor fissure. Interval decrease in size of 0.7 cm nodule within the right lower lobe anteriorly (image 94; series 5), previously 1.1 cm. Unchanged 4 mm nodule in the  lingula (image 64; series 5). Unchanged 3 mm nodule in the lingula (image 75; series 5). Unchanged 3 mm left lower lobe nodule (image 96; series 5). Musculoskeletal: Thoracic spine degenerative changes. No aggressive or acute appearing osseous lesions. CT ABDOMEN PELVIS FINDINGS Hepatobiliary: The liver is normal in size and contour. No focal hepatic lesion is identified. Gallbladder is decompressed. No intrahepatic or extrahepatic biliary ductal dilatation. Pancreas: Unremarkable Spleen: Unremarkable Adrenals/Urinary Tract: Normal adrenal glands. Kidneys enhance symmetrically with contrast. No hydronephrosis. Urinary bladder is unremarkable. Stomach/Bowel: Normal morphology of the stomach. No evidence for small bowel obstruction. No free fluid or free intraperitoneal air. Vascular/Lymphatic: Normal caliber abdominal aorta. No retroperitoneal lymphadenopathy. Reproductive: Status post hysterectomy. Other: None. Musculoskeletal: Lumbar spine degenerative changes. No aggressive or acute appearing osseous lesions. IMPRESSION: 1. Similar-appearing right upper lobe lung mass. Slight interval decrease in size of right lower lobe nodule. Additional pulmonary nodules are grossly similar. 2. Right PleurX catheter remains in position. Slight interval decrease in size of small right pleural effusion. Electronically Signed   By: Lovey Newcomer M.D.   On: 06/28/2018 14:22   Ct Abdomen Pelvis W Contrast  Result Date: 06/28/2018 CLINICAL DATA:  History of lung cancer.  Follow-up exam. EXAM: CT CHEST, ABDOMEN, AND PELVIS WITH CONTRAST TECHNIQUE: Multidetector CT imaging of the chest, abdomen and pelvis was performed following the standard protocol during bolus administration of intravenous contrast. CONTRAST:  173m OMNIPAQUE IOHEXOL 300 MG/ML  SOLN COMPARISON:  CT CAP 03/29/2018 FINDINGS: CT CHEST FINDINGS Cardiovascular: Normal heart size. Trace fluid superior pericardial recess. Thoracic aortic vascular calcifications.  Mediastinum/Nodes: No enlarged axillary, mediastinal or hilar lymphadenopathy. Normal appearance of the esophagus. Lungs/Pleura: Central airways are patent. Slight interval decrease in size of small right pleural effusion. Right PleurX catheter in place. Similar-appearing irregular mass right upper lobe measures 3.0 x 1.8 cm (image 33, series 5), previously 3.0 x 1.9 cm. Re demonstrated thickening along the right minor fissure. Interval decrease in size of 0.7 cm nodule within the right lower lobe anteriorly (image 94; series 5), previously 1.1 cm. Unchanged 4 mm nodule in the lingula (image 64; series 5). Unchanged 3 mm nodule in the lingula (image 75; series 5). Unchanged 3 mm left lower lobe nodule (image 96; series 5). Musculoskeletal: Thoracic spine degenerative changes. No aggressive or acute appearing osseous lesions. CT ABDOMEN PELVIS FINDINGS Hepatobiliary: The liver is normal in size and contour. No focal hepatic lesion is identified. Gallbladder is decompressed. No intrahepatic or extrahepatic biliary ductal dilatation. Pancreas: Unremarkable Spleen: Unremarkable Adrenals/Urinary Tract: Normal adrenal glands. Kidneys enhance symmetrically with contrast. No hydronephrosis. Urinary bladder is unremarkable. Stomach/Bowel: Normal morphology of the stomach. No evidence for small bowel obstruction. No free fluid or free intraperitoneal air. Vascular/Lymphatic: Normal caliber abdominal aorta. No retroperitoneal lymphadenopathy. Reproductive: Status post hysterectomy. Other: None. Musculoskeletal: Lumbar spine degenerative changes.  No aggressive or acute appearing osseous lesions. IMPRESSION: 1. Similar-appearing right upper lobe lung mass. Slight interval decrease in size of right lower lobe nodule. Additional pulmonary nodules are grossly similar. 2. Right PleurX catheter remains in position. Slight interval decrease in size of small right pleural effusion. Electronically Signed   By: Lovey Newcomer M.D.   On:  06/28/2018 14:22    ASSESSMENT AND PLAN: This is a very pleasant 68 years old never smoker African-American female recently with a stage IV non-small cell lung cancer, adenocarcinoma with positive EGFR mutation with deletion in exon 19. The patient was started on treatment with Tagrisso 80 mg p.o. daily status post 5 months of treatment. The patient has been tolerating this treatment well with no concerning adverse effects. She had repeat CT scan of the chest, abdomen and pelvis performed recently.  I personally and independently reviewed the scan images and discussed the results with the patient and her boyfriend today. Her scan showed no concerning findings for disease progression and there was slight interval decrease in the size of the right lower lobe nodule. I recommended for the patient to continue her current treatment with Tagrisso with the same dose. For the recurrent right pleural effusion, she will continue the drainage as recommended by her surgeon. I will see her back for follow-up visit in 1 months for evaluation with repeat CBC and comprehensive metabolic panel. She was advised to call immediately if she has any concerning symptoms in the interval. The patient voices understanding of current disease status and treatment options and is in agreement with the current care plan.  All questions were answered. The patient knows to call the clinic with any problems, questions or concerns. We can certainly see the patient much sooner if necessary.  Disclaimer: This note was dictated with voice recognition software. Similar sounding words can inadvertently be transcribed and may not be corrected upon review.

## 2018-07-03 DIAGNOSIS — I7300 Raynaud's syndrome without gangrene: Secondary | ICD-10-CM | POA: Diagnosis not present

## 2018-07-03 DIAGNOSIS — I10 Essential (primary) hypertension: Secondary | ICD-10-CM | POA: Diagnosis not present

## 2018-07-03 DIAGNOSIS — R7303 Prediabetes: Secondary | ICD-10-CM | POA: Diagnosis not present

## 2018-07-03 DIAGNOSIS — C3491 Malignant neoplasm of unspecified part of right bronchus or lung: Secondary | ICD-10-CM | POA: Diagnosis not present

## 2018-07-03 DIAGNOSIS — H409 Unspecified glaucoma: Secondary | ICD-10-CM | POA: Diagnosis not present

## 2018-07-03 DIAGNOSIS — Z48813 Encounter for surgical aftercare following surgery on the respiratory system: Secondary | ICD-10-CM | POA: Diagnosis not present

## 2018-07-03 DIAGNOSIS — Z4801 Encounter for change or removal of surgical wound dressing: Secondary | ICD-10-CM | POA: Diagnosis not present

## 2018-07-03 DIAGNOSIS — J45909 Unspecified asthma, uncomplicated: Secondary | ICD-10-CM | POA: Diagnosis not present

## 2018-07-03 DIAGNOSIS — J910 Malignant pleural effusion: Secondary | ICD-10-CM | POA: Diagnosis not present

## 2018-07-03 DIAGNOSIS — R69 Illness, unspecified: Secondary | ICD-10-CM | POA: Diagnosis not present

## 2018-07-08 DIAGNOSIS — C7800 Secondary malignant neoplasm of unspecified lung: Secondary | ICD-10-CM | POA: Diagnosis not present

## 2018-07-08 DIAGNOSIS — Z4801 Encounter for change or removal of surgical wound dressing: Secondary | ICD-10-CM | POA: Diagnosis not present

## 2018-07-08 DIAGNOSIS — Z48813 Encounter for surgical aftercare following surgery on the respiratory system: Secondary | ICD-10-CM | POA: Diagnosis not present

## 2018-07-08 DIAGNOSIS — C3491 Malignant neoplasm of unspecified part of right bronchus or lung: Secondary | ICD-10-CM | POA: Diagnosis not present

## 2018-07-08 DIAGNOSIS — J918 Pleural effusion in other conditions classified elsewhere: Secondary | ICD-10-CM | POA: Diagnosis not present

## 2018-07-08 DIAGNOSIS — J910 Malignant pleural effusion: Secondary | ICD-10-CM | POA: Diagnosis not present

## 2018-07-08 DIAGNOSIS — C50919 Malignant neoplasm of unspecified site of unspecified female breast: Secondary | ICD-10-CM | POA: Diagnosis not present

## 2018-07-08 DIAGNOSIS — J45909 Unspecified asthma, uncomplicated: Secondary | ICD-10-CM | POA: Diagnosis not present

## 2018-07-08 DIAGNOSIS — R69 Illness, unspecified: Secondary | ICD-10-CM | POA: Diagnosis not present

## 2018-07-08 DIAGNOSIS — H409 Unspecified glaucoma: Secondary | ICD-10-CM | POA: Diagnosis not present

## 2018-07-08 DIAGNOSIS — I7300 Raynaud's syndrome without gangrene: Secondary | ICD-10-CM | POA: Diagnosis not present

## 2018-07-08 DIAGNOSIS — I10 Essential (primary) hypertension: Secondary | ICD-10-CM | POA: Diagnosis not present

## 2018-07-08 DIAGNOSIS — R7303 Prediabetes: Secondary | ICD-10-CM | POA: Diagnosis not present

## 2018-07-09 ENCOUNTER — Telehealth: Payer: Self-pay

## 2018-07-09 ENCOUNTER — Telehealth: Payer: Self-pay | Admitting: Pharmacist

## 2018-07-09 NOTE — Telephone Encounter (Signed)
Oral Oncology Patient Advocate Encounter  Prior Authorization for [place] [employer] has been approved.    PA# [id number] Effective dates: 06/17/18 through 06/19/19  Oral Oncology Clinic will continue to follow.   [place] Patient [place] Phone [phone] Fax [phone]

## 2018-07-09 NOTE — Telephone Encounter (Signed)
Oral Oncology Patient Advocate Encounter  Denyse Amass also started one, ignore this renewal.  [place] Patient [place] Phone [phone] Fax [phone]

## 2018-07-09 NOTE — Telephone Encounter (Signed)
Oral Oncology Patient Advocate Encounter  Received notification from [place] that the existing prior authorization for Tagrisso is due for renewal.  Renewal PA submitted on CoverMyMeds Key [id number] Status is pending  Oral Oncology Clinic will continue to follow.  Note: PA did not ask clinical questions.  [place] Patient [place] Phone [phone] Fax [phone]

## 2018-07-09 NOTE — Telephone Encounter (Signed)
Oral Oncology Pharmacist Encounter  Received request from Cover [employer] Meds to renew insurance authorization for Tagrisso to [facility] Part [facility].  Key: [id number] Status is pending.  This encounter will continue to be updated until final determination.  Johny Drilling, PharmD, BCPS, BCOP  07/09/2018 7:49 AM Oral Oncology Clinic [phone]

## 2018-07-10 ENCOUNTER — Telehealth: Payer: Self-pay

## 2018-07-10 NOTE — Telephone Encounter (Signed)
Allison [name] contacted the office asking if it was ok to fly with her pleruX catheter in place.  She stated she wanted to travel to [facility] via plane.  I advised that it is ok to fly with the pleurX in place.  She was worried that she would have to drain while she was on the plane.  I advised her that it was ok to drain before she got on the plane if needed.  She currently drains two times a week on Monday and Thursday.  She acknowledged receipt.

## 2018-07-15 DIAGNOSIS — Z48813 Encounter for surgical aftercare following surgery on the respiratory system: Secondary | ICD-10-CM | POA: Diagnosis not present

## 2018-07-15 DIAGNOSIS — H409 Unspecified glaucoma: Secondary | ICD-10-CM | POA: Diagnosis not present

## 2018-07-15 DIAGNOSIS — R7303 Prediabetes: Secondary | ICD-10-CM | POA: Diagnosis not present

## 2018-07-15 DIAGNOSIS — C3491 Malignant neoplasm of unspecified part of right bronchus or lung: Secondary | ICD-10-CM | POA: Diagnosis not present

## 2018-07-15 DIAGNOSIS — R69 Illness, unspecified: Secondary | ICD-10-CM | POA: Diagnosis not present

## 2018-07-15 DIAGNOSIS — J910 Malignant pleural effusion: Secondary | ICD-10-CM | POA: Diagnosis not present

## 2018-07-15 DIAGNOSIS — J45909 Unspecified asthma, uncomplicated: Secondary | ICD-10-CM | POA: Diagnosis not present

## 2018-07-15 DIAGNOSIS — Z4801 Encounter for change or removal of surgical wound dressing: Secondary | ICD-10-CM | POA: Diagnosis not present

## 2018-07-15 DIAGNOSIS — I10 Essential (primary) hypertension: Secondary | ICD-10-CM | POA: Diagnosis not present

## 2018-07-15 DIAGNOSIS — I7300 Raynaud's syndrome without gangrene: Secondary | ICD-10-CM | POA: Diagnosis not present

## 2018-07-22 DIAGNOSIS — I10 Essential (primary) hypertension: Secondary | ICD-10-CM | POA: Diagnosis not present

## 2018-07-22 DIAGNOSIS — Z48813 Encounter for surgical aftercare following surgery on the respiratory system: Secondary | ICD-10-CM | POA: Diagnosis not present

## 2018-07-22 DIAGNOSIS — I7300 Raynaud's syndrome without gangrene: Secondary | ICD-10-CM | POA: Diagnosis not present

## 2018-07-22 DIAGNOSIS — J45909 Unspecified asthma, uncomplicated: Secondary | ICD-10-CM | POA: Diagnosis not present

## 2018-07-22 DIAGNOSIS — R69 Illness, unspecified: Secondary | ICD-10-CM | POA: Diagnosis not present

## 2018-07-22 DIAGNOSIS — J910 Malignant pleural effusion: Secondary | ICD-10-CM | POA: Diagnosis not present

## 2018-07-22 DIAGNOSIS — C3491 Malignant neoplasm of unspecified part of right bronchus or lung: Secondary | ICD-10-CM | POA: Diagnosis not present

## 2018-07-22 DIAGNOSIS — H409 Unspecified glaucoma: Secondary | ICD-10-CM | POA: Diagnosis not present

## 2018-07-22 DIAGNOSIS — R7303 Prediabetes: Secondary | ICD-10-CM | POA: Diagnosis not present

## 2018-07-22 DIAGNOSIS — Z4801 Encounter for change or removal of surgical wound dressing: Secondary | ICD-10-CM | POA: Diagnosis not present

## 2018-07-23 ENCOUNTER — Other Ambulatory Visit: Payer: Self-pay | Admitting: Cardiothoracic Surgery

## 2018-07-23 DIAGNOSIS — C3491 Malignant neoplasm of unspecified part of right bronchus or lung: Secondary | ICD-10-CM

## 2018-07-24 ENCOUNTER — Ambulatory Visit: Payer: Medicare HMO | Admitting: Cardiothoracic Surgery

## 2018-07-24 ENCOUNTER — Encounter: Payer: Self-pay | Admitting: Cardiothoracic Surgery

## 2018-07-24 ENCOUNTER — Other Ambulatory Visit: Payer: Self-pay

## 2018-07-24 ENCOUNTER — Ambulatory Visit
Admission: RE | Admit: 2018-07-24 | Discharge: 2018-07-24 | Disposition: A | Discharge status: Home / Self Care | Payer: Medicare HMO | Source: Ambulatory Visit | Attending: Cardiothoracic Surgery | Admitting: Cardiothoracic Surgery

## 2018-07-24 VITALS — BP 158/70 | HR 59 | Resp 16 | Ht 64.5 in | Wt 128.0 lb

## 2018-07-24 DIAGNOSIS — C3491 Malignant neoplasm of unspecified part of right bronchus or lung: Secondary | ICD-10-CM | POA: Diagnosis not present

## 2018-07-24 DIAGNOSIS — Z9689 Presence of other specified functional implants: Secondary | ICD-10-CM | POA: Diagnosis not present

## 2018-07-24 DIAGNOSIS — J910 Malignant pleural effusion: Secondary | ICD-10-CM

## 2018-07-24 DIAGNOSIS — C3411 Malignant neoplasm of upper lobe, right bronchus or lung: Secondary | ICD-10-CM | POA: Diagnosis not present

## 2018-07-24 NOTE — Progress Notes (Signed)
PCP is [name], MD Referring Provider is [name], MD  Chief Complaint  Patient presents with  . Pleural Effusion    RIGHT....4 wk f/u with a CXR to assess drainage/pleurX...Marland Kitchendraining MONDAY and THURSDAY    HPI: Patient routine office visit for right Pleurx catheter with history of adenocarcinoma.  She is on Tagrisso.  The last drainage volumes have significantly reduced now less than 100 cc.  Chest x-ray today shows no reaccumulation.  We will plan on removing catheter next week if current trend continues.  She will call us with the Monday drainage volume.  Last CT scan performed by Dr. [name] showed mild reduction in the right middle lobe mass and no progression of disease elsewhere   Past Medical History:  Diagnosis Date  . Anxiety   . Arthritis   . Asthma    exercise induced  . Depression    PMH  . GERD (gastroesophageal reflux disease)   . Glaucoma   . Hypertension   . Malignant pleural effusion    right  . PONV (postoperative nausea and vomiting)   . Pre-diabetes   . Raynaud's disease   . Raynaud's disease     Past Surgical History:  Procedure Laterality Date  . ABDOMINAL HYSTERECTOMY     partial  . CHEST TUBE INSERTION Right 01/01/2018   Procedure: INSERTION PLEURAL DRAINAGE CATHETER;  Surgeon: Ivin Poot, MD;  Location: [place];  Service: Thoracic;  Laterality: Right;  . COLONOSCOPY    . DILATION AND CURETTAGE OF UTERUS    . EYE SURGERY     due to Glaucoma  . ROTATOR CUFF REPAIR    . TUBAL LIGATION    . WISDOM TOOTH EXTRACTION      Family History  Problem Relation Age of Onset  . Heart disease Sister   . Heart disease Brother   . Lung cancer Other     Social History Social History   Tobacco Use  . Smoking status: Never Smoker  . Smokeless tobacco: Never Used  Substance Use Topics  . Alcohol use: Yes    Comment: up to 3 drinks per week  . Drug use: No    Comment: CBD oil     Current Outpatient Medications  Medication  Sig Dispense Refill  . ALPRAZolam (XANAX) 0.25 MG tablet Take 0.25mg  by mouth once daily as needed for anxiety    . amLODipine (NORVASC) 5 MG tablet Take 5 mg by mouth daily.  3  . brimonidine (ALPHAGAN P) 0.1 % SOLN Instill 1 drop in both eye twice daily    . dorzolamide-timolol (COSOPT) 22.3-6.8 MG/ML ophthalmic solution Instill 1 drop in both eyes twice daily    . fenofibrate 160 MG tablet Take 160 mg by mouth daily.    . hydrochlorothiazide (HYDRODIURIL) 25 MG tablet Take 25 mg by mouth daily.  4  . latanoprost (XALATAN) 0.005 % ophthalmic solution Place 1 drop into both eyes at bedtime.     . Multiple Vitamins-Minerals (MULTIVITAMIN ADULT PO) Take 1 tablet by mouth daily.    . naproxen sodium (ALEVE) 220 MG tablet Take 220 mg by mouth 2 (two) times daily as needed.    Marland Kitchen osimertinib mesylate (TAGRISSO) 80 MG tablet Take 1 tablet (80 mg total) by mouth daily. Refill Faxed to [facility] 03/26/18 30 tablet 3  . traMADol (ULTRAM) 50 MG tablet Take 50 mg by mouth 2 (two) times daily as needed. for pain  0  . triamcinolone cream (KENALOG) 0.1 % Apply 1  application topically 2 (two) times daily. 30 g 0   No current facility-administered medications for this visit.     Allergies  Allergen Reactions  . Penicillins Other (See Comments)    SYNCOPE PATIENT HAS HAD A PCN REACTION WITH IMMEDIATE RASH, FACIAL/TONGUE/THROAT SWELLING, SOB, OR LIGHTHEADEDNESS WITH HYPOTENSION:  #  #  YES  #  # Has patient had a PCN reaction causing severe rash involving mucus membranes or skin necrosis: No Has patient had a PCN reaction that required hospitalization: No Has patient had a PCN reaction occurring within the last 10 years: No   . Vicodin [Hydrocodone-Acetaminophen] Other (See Comments)    Sped up heart and breathing    Review of Systems  Some dry skin No weight loss Catheter exit site drainage  BP (!) 158/70 (BP Location: Right Arm, Patient Position: Sitting, Cuff Size: Normal) Comment: MANUALLY  Pulse  (!) 59   Resp 16   Ht 5' 4.5" (1.638 m)   Wt 128 lb (58.1 kg)   SpO2 100% Comment: ON RA  BMI 21.63 kg/m  Physical Exam Alert and comfortable Lungs clear Heart rate regular Catheter exit site personally reviewed and is clean and dry  Diagnostic Tests: Chest x-ray image personally reviewed showing catheter in good position no significant pleural effusion  Impression: Pleurx catheter drainage has significantly tapered down in response to targeted chemotherapy.  Hopefully will be able to remove catheter next week.  Plan: Patient will call office after the next tube drainage sessions.  If both are well below 100 cc then schedule for catheter removal next week.  Len Childs, MD [facility] [phone]

## 2018-07-29 DIAGNOSIS — J45909 Unspecified asthma, uncomplicated: Secondary | ICD-10-CM | POA: Diagnosis not present

## 2018-07-29 DIAGNOSIS — C3491 Malignant neoplasm of unspecified part of right bronchus or lung: Secondary | ICD-10-CM | POA: Diagnosis not present

## 2018-07-29 DIAGNOSIS — I7300 Raynaud's syndrome without gangrene: Secondary | ICD-10-CM | POA: Diagnosis not present

## 2018-07-29 DIAGNOSIS — R7303 Prediabetes: Secondary | ICD-10-CM | POA: Diagnosis not present

## 2018-07-29 DIAGNOSIS — Z4801 Encounter for change or removal of surgical wound dressing: Secondary | ICD-10-CM | POA: Diagnosis not present

## 2018-07-29 DIAGNOSIS — I10 Essential (primary) hypertension: Secondary | ICD-10-CM | POA: Diagnosis not present

## 2018-07-29 DIAGNOSIS — J910 Malignant pleural effusion: Secondary | ICD-10-CM | POA: Diagnosis not present

## 2018-07-29 DIAGNOSIS — Z48813 Encounter for surgical aftercare following surgery on the respiratory system: Secondary | ICD-10-CM | POA: Diagnosis not present

## 2018-07-29 DIAGNOSIS — R69 Illness, unspecified: Secondary | ICD-10-CM | POA: Diagnosis not present

## 2018-07-29 DIAGNOSIS — H409 Unspecified glaucoma: Secondary | ICD-10-CM | POA: Diagnosis not present

## 2018-07-30 ENCOUNTER — Telehealth: Payer: Self-pay | Admitting: *Deleted

## 2018-07-30 NOTE — Telephone Encounter (Signed)
Allison [name] last saw Dr. Prescott Gum on 07/24/18.  It was decided that when she drained the next week, if the amount was less than 100cc, removal could be scheduled.  She spent the weekend in [place] and had a great time. There were no issues with the cather, drainage or breathing. She admits she is anxious about removing the catheter since she has had it so long.  She's requesting to drain it once a week for a couple more weeks to be sure.  I said that would be o.k.  She will inform us of the amounts at the end of this time. I will inform Dr. Prescott Gum of her request.

## 2018-08-02 DIAGNOSIS — Z48813 Encounter for surgical aftercare following surgery on the respiratory system: Secondary | ICD-10-CM | POA: Diagnosis not present

## 2018-08-02 DIAGNOSIS — I7300 Raynaud's syndrome without gangrene: Secondary | ICD-10-CM | POA: Diagnosis not present

## 2018-08-02 DIAGNOSIS — I10 Essential (primary) hypertension: Secondary | ICD-10-CM | POA: Diagnosis not present

## 2018-08-02 DIAGNOSIS — Z4801 Encounter for change or removal of surgical wound dressing: Secondary | ICD-10-CM | POA: Diagnosis not present

## 2018-08-02 DIAGNOSIS — J910 Malignant pleural effusion: Secondary | ICD-10-CM | POA: Diagnosis not present

## 2018-08-02 DIAGNOSIS — R69 Illness, unspecified: Secondary | ICD-10-CM | POA: Diagnosis not present

## 2018-08-02 DIAGNOSIS — C3491 Malignant neoplasm of unspecified part of right bronchus or lung: Secondary | ICD-10-CM | POA: Diagnosis not present

## 2018-08-02 DIAGNOSIS — J45909 Unspecified asthma, uncomplicated: Secondary | ICD-10-CM | POA: Diagnosis not present

## 2018-08-02 DIAGNOSIS — H409 Unspecified glaucoma: Secondary | ICD-10-CM | POA: Diagnosis not present

## 2018-08-02 DIAGNOSIS — R7303 Prediabetes: Secondary | ICD-10-CM | POA: Diagnosis not present

## 2018-08-05 ENCOUNTER — Telehealth: Payer: Self-pay | Admitting: Pharmacist

## 2018-08-05 ENCOUNTER — Encounter: Payer: Self-pay | Admitting: Internal Medicine

## 2018-08-05 ENCOUNTER — Inpatient Hospital Stay: Payer: Medicare HMO | Admitting: Internal Medicine

## 2018-08-05 ENCOUNTER — Inpatient Hospital Stay: Payer: Medicare HMO | Attending: Internal Medicine

## 2018-08-05 ENCOUNTER — Telehealth: Payer: Self-pay

## 2018-08-05 VITALS — BP 157/76 | HR 60 | Temp 98.7°F | Resp 17 | Ht 64.5 in | Wt 132.2 lb

## 2018-08-05 DIAGNOSIS — C3411 Malignant neoplasm of upper lobe, right bronchus or lung: Secondary | ICD-10-CM | POA: Diagnosis not present

## 2018-08-05 DIAGNOSIS — Z5111 Encounter for antineoplastic chemotherapy: Secondary | ICD-10-CM

## 2018-08-05 DIAGNOSIS — J910 Malignant pleural effusion: Secondary | ICD-10-CM | POA: Diagnosis not present

## 2018-08-05 DIAGNOSIS — C3491 Malignant neoplasm of unspecified part of right bronchus or lung: Secondary | ICD-10-CM

## 2018-08-05 LAB — CBC WITH DIFFERENTIAL (CANCER CENTER ONLY)
Abs Immature Granulocytes: 0.01 10*3/uL (ref 0.00–0.07)
Basophils Absolute: 0 10*3/uL (ref 0.0–0.1)
Basophils Relative: 0 %
Eosinophils Absolute: 0.1 10*3/uL (ref 0.0–0.5)
Eosinophils Relative: 2 %
HCT: 38 % (ref 36.0–46.0)
Hemoglobin: 11.7 g/dL — ABNORMAL LOW (ref 12.0–15.0)
Immature Granulocytes: 0 %
Lymphocytes Relative: 37 %
Lymphs Abs: 1.3 10*3/uL (ref 0.7–4.0)
MCH: 25.9 pg — ABNORMAL LOW (ref 26.0–34.0)
MCHC: 30.8 g/dL (ref 30.0–36.0)
MCV: 84.3 fL (ref 80.0–100.0)
Monocytes Absolute: 0.4 10*3/uL (ref 0.1–1.0)
Monocytes Relative: 10 %
Neutro Abs: 1.8 10*3/uL (ref 1.7–7.7)
Neutrophils Relative %: 51 %
Platelet Count: 247 10*3/uL (ref 150–400)
RBC: 4.51 MIL/uL (ref 3.87–5.11)
RDW: 14.1 % (ref 11.5–15.5)
WBC Count: 3.6 10*3/uL — ABNORMAL LOW (ref 4.0–10.5)
nRBC: 0 % (ref 0.0–0.2)

## 2018-08-05 LAB — CMP (CANCER CENTER ONLY)
ALT: 19 U/L (ref 0–44)
AST: 33 U/L (ref 15–41)
Albumin: 3.8 g/dL (ref 3.5–5.0)
Alkaline Phosphatase: 51 U/L (ref 38–126)
Anion gap: 10 (ref 5–15)
BUN: 11 mg/dL (ref 8–23)
CO2: 26 mmol/L (ref 22–32)
Calcium: 9.4 mg/dL (ref 8.9–10.3)
Chloride: 106 mmol/L (ref 98–111)
Creatinine: 1 mg/dL (ref 0.44–1.00)
GFR, Est AFR Am: >60 mL/min (ref >60)
GFR, Estimated: 58 mL/min — ABNORMAL LOW (ref >60)
Glucose, Bld: 70 mg/dL (ref 70–99)
Potassium: 3.6 mmol/L (ref 3.5–5.1)
Sodium: 142 mmol/L (ref 135–145)
Total Bilirubin: 0.2 mg/dL — ABNORMAL LOW (ref 0.3–1.2)
Total Protein: 7.5 g/dL (ref 6.5–8.1)

## 2018-08-05 NOTE — Progress Notes (Signed)
[place] Telephone:([phone]) [phone]   [phone]  OFFICE PROGRESS NOTE  Allison Blade, [place] [name]  DIAGNOSIS: Stage IV (T2 a,N2, M1a) non-small cell lung cancer, adenocarcinoma diagnosed in July 2019 and presented with right upper lobe lung mass in addition to mediastinal lymphadenopathy as well as bilateral pulmonary nodules and malignant right pleural effusion.  Biomarker Findings Microsatellite status - Cannot Be Determined Tumor Mutational Burden - Cannot Be Determined Genomic Findings For a complete list of the genes assayed, please refer to the Appendix. EGFR exon 19 deletion ([id number]) TP53 Y220C 7 Disease relevant genes with no reportable alterations: KRAS, ALK, BRAF, MET, RET, ERBB2, ROS1   PRIOR THERAPY: Status post right Pleurx catheter placement by Dr. Prescott Gum for drainage of malignant right pleural effusion.  CURRENT THERAPY: Tagrisso 80 mg p.o. daily.  First dose was given on 01/29/2018.  Status post 6 months of treatment.  INTERVAL HISTORY: Allison [name] 68 y.o. female returns to the clinic today for follow-up visit accompanied by her boyfriend.  The patient is feeling fine today with no concerning complaints.  She continues to have drainage from the right Pleurx catheter around 200 mL in 1 week.  She denied having any chest pain, shortness of breath, cough or hemoptysis.  She denied having any fever or chills.  She has no nausea, vomiting, diarrhea or constipation.  She denied having any headache or visual changes.  The patient is here today for evaluation and repeat blood work.  MEDICAL HISTORY: Past Medical History:  Diagnosis Date  . Anxiety   . Arthritis   . Asthma    exercise induced  . Depression    PMH  . GERD (gastroesophageal reflux disease)   . Glaucoma   . Hypertension   . Malignant pleural effusion    right  . PONV (postoperative nausea and vomiting)   . Pre-diabetes    . Raynaud's disease   . Raynaud's disease     ALLERGIES:  is allergic to penicillins and vicodin [hydrocodone-acetaminophen].  MEDICATIONS:  Current Outpatient Medications  Medication Sig Dispense Refill  . ALPRAZolam (XANAX) 0.25 MG tablet Take 0.'25mg'$  by mouth once daily as needed for anxiety    . amLODipine (NORVASC) 5 MG tablet Take 5 mg by mouth daily.  3  . brimonidine (ALPHAGAN P) 0.1 % SOLN Instill 1 drop in both eye twice daily    . dorzolamide-timolol (COSOPT) 22.3-6.8 MG/ML ophthalmic solution Instill 1 drop in both eyes twice daily    . fenofibrate 160 MG tablet Take 160 mg by mouth daily.    . hydrochlorothiazide (HYDRODIURIL) 25 MG tablet Take 25 mg by mouth daily.  4  . latanoprost (XALATAN) 0.005 % ophthalmic solution Place 1 drop into both eyes at bedtime.     . Multiple Vitamins-Minerals (MULTIVITAMIN ADULT PO) Take 1 tablet by mouth daily.    . naproxen sodium (ALEVE) 220 MG tablet Take 220 mg by mouth 2 (two) times daily as needed.    Marland Kitchen osimertinib mesylate (TAGRISSO) 80 MG tablet Take 1 tablet (80 mg total) by mouth daily. Refill Faxed to [facility] 03/26/18 30 tablet 3  . traMADol (ULTRAM) 50 MG tablet Take 50 mg by mouth 2 (two) times daily as needed. for pain  0  . triamcinolone cream (KENALOG) 0.1 % Apply 1 application topically 2 (two) times daily. 30 g 0   No current facility-administered medications for this visit.  SURGICAL HISTORY:  Past Surgical History:  Procedure Laterality Date  . ABDOMINAL HYSTERECTOMY     partial  . CHEST TUBE INSERTION Right 01/01/2018   Procedure: INSERTION PLEURAL DRAINAGE CATHETER;  Surgeon: Ivin Poot, MD;  Location: [facility];  Service: Thoracic;  Laterality: Right;  . COLONOSCOPY    . DILATION AND CURETTAGE OF UTERUS    . EYE SURGERY     due to Glaucoma  . ROTATOR CUFF REPAIR    . TUBAL LIGATION    . WISDOM TOOTH EXTRACTION      REVIEW OF SYSTEMS:  A comprehensive review of systems was negative.   PHYSICAL  EXAMINATION: General appearance: alert, cooperative and no distress Head: Normocephalic, without obvious abnormality, atraumatic Neck: no adenopathy, no JVD, supple, symmetrical, trachea midline and thyroid not enlarged, symmetric, no tenderness/mass/nodules Lymph nodes: Cervical, supraclavicular, and axillary nodes normal. Resp: clear to auscultation bilaterally Back: symmetric, no curvature. ROM normal. No CVA tenderness. Cardio: regular rate and rhythm, S1, S2 normal, no murmur, click, rub or gallop GI: soft, non-tender; bowel sounds normal; no masses,  no organomegaly Extremities: extremities normal, atraumatic, no cyanosis or edema  ECOG PERFORMANCE STATUS: 1 - Symptomatic but completely ambulatory  Blood pressure (!) 157/76, pulse 60, temperature 98.7 F (37.1 C), temperature source Oral, resp. rate 17, height 5' 4.5" (1.638 m), weight 132 lb 3.2 oz (60 kg), SpO2 100 %.  LABORATORY DATA: Lab Results  Component Value Date   WBC 2.8 (L) 06/28/2018   HGB 12.4 06/28/2018   HCT 39.4 06/28/2018   MCV 83.3 06/28/2018   PLT 266 06/28/2018      Chemistry      Component Value Date/Time   NA 142 06/28/2018 0900   K 3.9 06/28/2018 0900   CL 105 06/28/2018 0900   CO2 27 06/28/2018 0900   BUN 14 06/28/2018 0900   CREATININE 1.05 (H) 06/28/2018 0900      Component Value Date/Time   CALCIUM 9.8 06/28/2018 0900   ALKPHOS 50 06/28/2018 0900   AST 23 06/28/2018 0900   ALT 17 06/28/2018 0900   BILITOT 0.3 06/28/2018 0900       RADIOGRAPHIC STUDIES: Dg Chest 2 View  Result Date: 07/24/2018 CLINICAL DATA:  68 year old female with right upper lobe adenocarcinoma, malignant pleural effusion. PleurX catheter. EXAM: CHEST - 2 VIEW COMPARISON:  Restaging chest and body CTs 06/28/2018 and earlier. FINDINGS: Stable percutaneous right chest tube/pleural catheter which courses to the right apex. Trace residual right pleural effusion appears radiographically stable since 06/24/2018.  Radiographically stable spiculated mass in the right upper lobe. No pneumothorax, pulmonary edema or new pulmonary opacity. Mediastinal contours remain normal. Visualized tracheal air column is within normal limits. No acute osseous abnormality identified. Negative visible bowel gas pattern. IMPRESSION: Stable right side pleural catheter and radiographic appearance of the chest since 06/24/2018. Electronically Signed   By: Genevie Ann M.D.   On: 07/24/2018 16:17    ASSESSMENT AND PLAN: This is a very pleasant 68 years old never smoker African-American female recently with a stage IV non-small cell lung cancer, adenocarcinoma with positive EGFR mutation with deletion in exon 19. The patient was started on treatment with Tagrisso 80 mg p.o. daily status post 6 months of treatment. The patient continues to tolerate her treatment with Tagrisso fairly well. I recommended for her to continue her current treatment with the same dose. I will see her back for follow-up visit in 1 months for evaluation and repeat blood work. She was advised  to call immediately if she has any concerning symptoms in the interval. The patient voices understanding of current disease status and treatment options and is in agreement with the current care plan. All questions were answered. The patient knows to call the clinic with any problems, questions or concerns. We can certainly see the patient much sooner if necessary.  Disclaimer: This note was dictated with voice recognition software. Similar sounding words can inadvertently be transcribed and may not be corrected upon review.

## 2018-08-05 NOTE — Telephone Encounter (Signed)
Oral Chemotherapy Pharmacist Encounter  Follow-Up Form  Spoke with patient at [facility] today to follow up regarding patient's oral chemotherapy medication:  Tagrisso (osimertinib)for the treatment of metastatic non small cell lung cancer, EGFR mutation positive (exon 19 deletion), planned durationuntil disease progression or unacceptable toxicity.  Original Start date of oral chemotherapy: 01/29/2018  Pt is doing well today  Pt reports 0 tablets/doses of Tagrisso 80 tablets, 1 tablet by mouth once daily, without regard to food missed in the last month.  Pt reports the following side effects:   Skin toxicities have remitted, skin looking very clear today  Patient states that she has moved her Tagrisso administration to an empty stomach, 1 hour before or 2 hours after food, as when she takes her Tagrisso to close to the fed state she notices that she has an increase in watery or loose stools  Pertinent labs reviewed: Okay for continued treatment.  Other Issues: Patient with 1 refill left on current prescription with [employer] patient assistance program, we will work with MD to get a new prescription to the [facility] specialty pharmacy to prevent any delay in treatment.  Patient knows to call the office with questions or concerns. Oral Oncology Clinic will continue to follow.  Johny Drilling, PharmD, BCPS, BCOP  08/05/2018 9:54 AM Oral Oncology Clinic [phone]

## 2018-08-05 NOTE — Telephone Encounter (Signed)
Patient declined avs and calender of upcoming appointment per 2/17 los MyChart

## 2018-08-12 DIAGNOSIS — R69 Illness, unspecified: Secondary | ICD-10-CM | POA: Diagnosis not present

## 2018-08-12 DIAGNOSIS — Z48813 Encounter for surgical aftercare following surgery on the respiratory system: Secondary | ICD-10-CM | POA: Diagnosis not present

## 2018-08-12 DIAGNOSIS — Z4801 Encounter for change or removal of surgical wound dressing: Secondary | ICD-10-CM | POA: Diagnosis not present

## 2018-08-12 DIAGNOSIS — C3491 Malignant neoplasm of unspecified part of right bronchus or lung: Secondary | ICD-10-CM | POA: Diagnosis not present

## 2018-08-12 DIAGNOSIS — J910 Malignant pleural effusion: Secondary | ICD-10-CM | POA: Diagnosis not present

## 2018-08-12 DIAGNOSIS — I7300 Raynaud's syndrome without gangrene: Secondary | ICD-10-CM | POA: Diagnosis not present

## 2018-08-12 DIAGNOSIS — H409 Unspecified glaucoma: Secondary | ICD-10-CM | POA: Diagnosis not present

## 2018-08-12 DIAGNOSIS — R7303 Prediabetes: Secondary | ICD-10-CM | POA: Diagnosis not present

## 2018-08-12 DIAGNOSIS — J45909 Unspecified asthma, uncomplicated: Secondary | ICD-10-CM | POA: Diagnosis not present

## 2018-08-12 DIAGNOSIS — I10 Essential (primary) hypertension: Secondary | ICD-10-CM | POA: Diagnosis not present

## 2018-08-19 DIAGNOSIS — I7300 Raynaud's syndrome without gangrene: Secondary | ICD-10-CM | POA: Diagnosis not present

## 2018-08-19 DIAGNOSIS — H409 Unspecified glaucoma: Secondary | ICD-10-CM | POA: Diagnosis not present

## 2018-08-19 DIAGNOSIS — C3491 Malignant neoplasm of unspecified part of right bronchus or lung: Secondary | ICD-10-CM | POA: Diagnosis not present

## 2018-08-19 DIAGNOSIS — J910 Malignant pleural effusion: Secondary | ICD-10-CM | POA: Diagnosis not present

## 2018-08-19 DIAGNOSIS — I10 Essential (primary) hypertension: Secondary | ICD-10-CM | POA: Diagnosis not present

## 2018-08-19 DIAGNOSIS — R69 Illness, unspecified: Secondary | ICD-10-CM | POA: Diagnosis not present

## 2018-08-19 DIAGNOSIS — Z48813 Encounter for surgical aftercare following surgery on the respiratory system: Secondary | ICD-10-CM | POA: Diagnosis not present

## 2018-08-19 DIAGNOSIS — Z4801 Encounter for change or removal of surgical wound dressing: Secondary | ICD-10-CM | POA: Diagnosis not present

## 2018-08-19 DIAGNOSIS — R7303 Prediabetes: Secondary | ICD-10-CM | POA: Diagnosis not present

## 2018-08-19 DIAGNOSIS — J45909 Unspecified asthma, uncomplicated: Secondary | ICD-10-CM | POA: Diagnosis not present

## 2018-08-21 ENCOUNTER — Ambulatory Visit: Payer: Medicare HMO | Admitting: Cardiothoracic Surgery

## 2018-08-21 ENCOUNTER — Other Ambulatory Visit: Payer: Self-pay | Admitting: *Deleted

## 2018-08-21 ENCOUNTER — Ambulatory Visit
Admission: RE | Admit: 2018-08-21 | Discharge: 2018-08-21 | Disposition: A | Discharge status: Home / Self Care | Payer: Medicare HMO | Source: Ambulatory Visit | Attending: Cardiothoracic Surgery | Admitting: Cardiothoracic Surgery

## 2018-08-21 ENCOUNTER — Encounter: Payer: Self-pay | Admitting: Cardiothoracic Surgery

## 2018-08-21 VITALS — BP 130/76 | HR 72 | Resp 20 | Ht 64.5 in | Wt 132.0 lb

## 2018-08-21 DIAGNOSIS — C3491 Malignant neoplasm of unspecified part of right bronchus or lung: Secondary | ICD-10-CM | POA: Diagnosis not present

## 2018-08-21 DIAGNOSIS — Z9689 Presence of other specified functional implants: Secondary | ICD-10-CM

## 2018-08-21 DIAGNOSIS — J90 Pleural effusion, not elsewhere classified: Secondary | ICD-10-CM | POA: Diagnosis not present

## 2018-08-21 DIAGNOSIS — J910 Malignant pleural effusion: Secondary | ICD-10-CM | POA: Diagnosis not present

## 2018-08-21 NOTE — Patient Instructions (Signed)
Continue pleurx drainage Allison [name]

## 2018-08-21 NOTE — Progress Notes (Signed)
[address]       [place] [address]             [phone]      Alessa L Kottke [place] Medical Record #[id number] Date of Birth: 07/15/1950  Referring: [name], MD Primary Care: [name], MD Primary Cardiologist: No primary care provider on file.   Chief Complaint:   POST OP FOLLOW UP  History of Present Illness:    The patient is seen in the office on todays date in routine follow -up for pleurx catheter.She continues to be followed by Dr [name] and reports that she is doing well with Tagrisso. She continues to have about 100 -200 cc per weekly drainage of her pleurx . On CXR the effusion remains small/stable in appearance.She denies SOB . She is active and asymptomatic.       Past Medical History:  Diagnosis Date  . Anxiety   . Arthritis   . Asthma    exercise induced  . Depression    PMH  . GERD (gastroesophageal reflux disease)   . Glaucoma   . Hypertension   . Malignant pleural effusion    right  . PONV (postoperative nausea and vomiting)   . Pre-diabetes   . Raynaud's disease   . Raynaud's disease      Social History   Tobacco Use  Smoking Status Never Smoker  Smokeless Tobacco Never Used    Social History   Substance and Sexual Activity  Alcohol Use Yes   Comment: up to 3 drinks per week     Allergies  Allergen Reactions  . Penicillins Other (See Comments)    SYNCOPE PATIENT HAS HAD A PCN REACTION WITH IMMEDIATE RASH, FACIAL/TONGUE/THROAT SWELLING, SOB, OR LIGHTHEADEDNESS WITH HYPOTENSION:  #  #  YES  #  # Has patient had a PCN reaction causing severe rash involving mucus membranes or skin necrosis: No Has patient had a PCN reaction that required hospitalization: No Has patient had a PCN reaction occurring within the last 10 years: No   . Vicodin [Hydrocodone-Acetaminophen] Other (See Comments)    Sped up heart and breathing    Current Outpatient Medications  Medication Sig Dispense Refill   . ALPRAZolam (XANAX) 0.25 MG tablet Take 0.25mg  by mouth once daily as needed for anxiety    . amLODipine (NORVASC) 5 MG tablet Take 5 mg by mouth daily.  3  . dorzolamide-timolol (COSOPT) 22.3-6.8 MG/ML ophthalmic solution Instill 1 drop in both eyes twice daily    . ezetimibe (ZETIA) 10 MG tablet ezetimibe 10 mg tablet  Take 1 tablet every day by oral route.    . hydrochlorothiazide (HYDRODIURIL) 25 MG tablet Take 25 mg by mouth daily.  4  . latanoprost (XALATAN) 0.005 % ophthalmic solution Place 1 drop into both eyes at bedtime.     . Multiple Vitamins-Minerals (MULTIVITAMIN ADULT PO) Take 1 tablet by mouth daily.    . naproxen sodium (ALEVE) 220 MG tablet Take 220 mg by mouth 2 (two) times daily as needed.    Marland Kitchen osimertinib mesylate (TAGRISSO) 80 MG tablet Take 1 tablet (80 mg total) by mouth daily. Refill Faxed to [facility] 03/26/18 30 tablet 3  . traMADol (ULTRAM) 50 MG tablet Take 50 mg by mouth 2 (two) times daily as needed. for pain  0   No current facility-administered medications for this visit.        Physical Exam: BP 130/76   Pulse 72  Resp 20   Ht 5' 4.5" (1.638 m)   Wt 59.9 kg   BMI 22.31 kg/m   General appearance: alert, cooperative and no distress Heart: regular rate and rhythm Lungs: mildly dim in the right base   Diagnostic Studies & Laboratory data:     Recent Radiology Findings:   Dg Chest 2 View  Result Date: 08/21/2018 CLINICAL DATA:  68 y/o  F; right pleural effusion. EXAM: CHEST - 2 VIEW COMPARISON:  07/24/2018 chest radiograph FINDINGS: Stable normal cardiac silhouette given projection and technique. Right-sided pleural catheter is stable in position. Stable small right pleural effusion. Stable spiculated nodule within the right lung apex. No new consolidation. No appreciable pneumothorax. Bones are unremarkable. IMPRESSION: Stable small right pleural effusion. Stable right apical spiculated nodule. Stable position of right pleural catheter. Electronically  Signed   By: Kristine Garbe M.D.   On: 08/21/2018 16:29      Recent Lab Findings: Lab Results  Component Value Date   WBC 3.6 (L) 08/05/2018   HGB 11.7 (L) 08/05/2018   HCT 38.0 08/05/2018   PLT 247 08/05/2018   GLUCOSE 70 08/05/2018   ALT 19 08/05/2018   AST 33 08/05/2018   NA 142 08/05/2018   K 3.6 08/05/2018   CL 106 08/05/2018   CREATININE 1.00 08/05/2018   BUN 11 08/05/2018   CO2 26 08/05/2018   INR 0.98 01/01/2018      Assessment / Plan:  Doing well, we will see in 1 month or prior to that if drainage conts to be less than 100 cc/week . She knows to keep the office informed of progress.           John Giovanni, PA-C 08/21/2018 4:54 PM

## 2018-08-27 ENCOUNTER — Other Ambulatory Visit: Payer: Self-pay | Admitting: Internal Medicine

## 2018-08-27 DIAGNOSIS — J910 Malignant pleural effusion: Secondary | ICD-10-CM | POA: Diagnosis not present

## 2018-08-27 DIAGNOSIS — C3491 Malignant neoplasm of unspecified part of right bronchus or lung: Secondary | ICD-10-CM | POA: Diagnosis not present

## 2018-08-27 DIAGNOSIS — Z4801 Encounter for change or removal of surgical wound dressing: Secondary | ICD-10-CM | POA: Diagnosis not present

## 2018-08-27 DIAGNOSIS — R69 Illness, unspecified: Secondary | ICD-10-CM | POA: Diagnosis not present

## 2018-08-27 DIAGNOSIS — Z48813 Encounter for surgical aftercare following surgery on the respiratory system: Secondary | ICD-10-CM | POA: Diagnosis not present

## 2018-08-27 DIAGNOSIS — R7303 Prediabetes: Secondary | ICD-10-CM | POA: Diagnosis not present

## 2018-08-27 DIAGNOSIS — I7300 Raynaud's syndrome without gangrene: Secondary | ICD-10-CM | POA: Diagnosis not present

## 2018-08-27 DIAGNOSIS — J45909 Unspecified asthma, uncomplicated: Secondary | ICD-10-CM | POA: Diagnosis not present

## 2018-08-27 DIAGNOSIS — I10 Essential (primary) hypertension: Secondary | ICD-10-CM | POA: Diagnosis not present

## 2018-08-27 DIAGNOSIS — H409 Unspecified glaucoma: Secondary | ICD-10-CM | POA: Diagnosis not present

## 2018-08-27 MED ORDER — OSIMERTINIB MESYLATE 80 MG PO TABS: 80.0000 mg | ORAL_TABLET | Freq: Every day | ORAL | 3 refills | Status: DC

## 2018-08-28 ENCOUNTER — Other Ambulatory Visit: Payer: Self-pay | Admitting: Medical Oncology

## 2018-08-28 DIAGNOSIS — C3491 Malignant neoplasm of unspecified part of right bronchus or lung: Secondary | ICD-10-CM

## 2018-08-28 MED ORDER — OSIMERTINIB MESYLATE 80 MG PO TABS: 80.0000 mg | ORAL_TABLET | Freq: Every day | ORAL | 3 refills | Status: DC

## 2018-08-29 DIAGNOSIS — C7800 Secondary malignant neoplasm of unspecified lung: Secondary | ICD-10-CM | POA: Diagnosis not present

## 2018-08-29 DIAGNOSIS — C50919 Malignant neoplasm of unspecified site of unspecified female breast: Secondary | ICD-10-CM | POA: Diagnosis not present

## 2018-08-29 DIAGNOSIS — J910 Malignant pleural effusion: Secondary | ICD-10-CM | POA: Diagnosis not present

## 2018-08-29 DIAGNOSIS — J918 Pleural effusion in other conditions classified elsewhere: Secondary | ICD-10-CM | POA: Diagnosis not present

## 2018-09-01 DIAGNOSIS — R69 Illness, unspecified: Secondary | ICD-10-CM | POA: Diagnosis not present

## 2018-09-01 DIAGNOSIS — Z48813 Encounter for surgical aftercare following surgery on the respiratory system: Secondary | ICD-10-CM | POA: Diagnosis not present

## 2018-09-01 DIAGNOSIS — C3491 Malignant neoplasm of unspecified part of right bronchus or lung: Secondary | ICD-10-CM | POA: Diagnosis not present

## 2018-09-01 DIAGNOSIS — J45909 Unspecified asthma, uncomplicated: Secondary | ICD-10-CM | POA: Diagnosis not present

## 2018-09-01 DIAGNOSIS — M1991 Primary osteoarthritis, unspecified site: Secondary | ICD-10-CM | POA: Diagnosis not present

## 2018-09-01 DIAGNOSIS — I10 Essential (primary) hypertension: Secondary | ICD-10-CM | POA: Diagnosis not present

## 2018-09-01 DIAGNOSIS — I7300 Raynaud's syndrome without gangrene: Secondary | ICD-10-CM | POA: Diagnosis not present

## 2018-09-01 DIAGNOSIS — H409 Unspecified glaucoma: Secondary | ICD-10-CM | POA: Diagnosis not present

## 2018-09-01 DIAGNOSIS — J910 Malignant pleural effusion: Secondary | ICD-10-CM | POA: Diagnosis not present

## 2018-09-01 DIAGNOSIS — Z4801 Encounter for change or removal of surgical wound dressing: Secondary | ICD-10-CM | POA: Diagnosis not present

## 2018-09-03 ENCOUNTER — Inpatient Hospital Stay: Payer: Medicare HMO

## 2018-09-03 ENCOUNTER — Other Ambulatory Visit: Payer: Self-pay

## 2018-09-03 ENCOUNTER — Inpatient Hospital Stay: Payer: Medicare HMO | Attending: Internal Medicine | Admitting: Internal Medicine

## 2018-09-03 ENCOUNTER — Encounter: Payer: Self-pay | Admitting: Internal Medicine

## 2018-09-03 ENCOUNTER — Telehealth: Payer: Self-pay | Admitting: Internal Medicine

## 2018-09-03 VITALS — BP 159/75 | HR 57 | Temp 98.0°F | Resp 20 | Ht 64.0 in | Wt 131.8 lb

## 2018-09-03 DIAGNOSIS — C3491 Malignant neoplasm of unspecified part of right bronchus or lung: Secondary | ICD-10-CM

## 2018-09-03 DIAGNOSIS — Z5111 Encounter for antineoplastic chemotherapy: Secondary | ICD-10-CM

## 2018-09-03 DIAGNOSIS — C3490 Malignant neoplasm of unspecified part of unspecified bronchus or lung: Secondary | ICD-10-CM

## 2018-09-03 DIAGNOSIS — C3411 Malignant neoplasm of upper lobe, right bronchus or lung: Secondary | ICD-10-CM | POA: Diagnosis not present

## 2018-09-03 DIAGNOSIS — J910 Malignant pleural effusion: Secondary | ICD-10-CM | POA: Diagnosis not present

## 2018-09-03 LAB — CBC WITH DIFFERENTIAL (CANCER CENTER ONLY)
Abs Immature Granulocytes: 0.01 10*3/uL (ref 0.00–0.07)
Basophils Absolute: 0 10*3/uL (ref 0.0–0.1)
Basophils Relative: 1 %
Eosinophils Absolute: 0.1 10*3/uL (ref 0.0–0.5)
Eosinophils Relative: 1 %
HCT: 37.6 % (ref 36.0–46.0)
Hemoglobin: 11.7 g/dL — ABNORMAL LOW (ref 12.0–15.0)
Immature Granulocytes: 0 %
Lymphocytes Relative: 35 %
Lymphs Abs: 1.5 10*3/uL (ref 0.7–4.0)
MCH: 25.8 pg — ABNORMAL LOW (ref 26.0–34.0)
MCHC: 31.1 g/dL (ref 30.0–36.0)
MCV: 83 fL (ref 80.0–100.0)
Monocytes Absolute: 0.4 10*3/uL (ref 0.1–1.0)
Monocytes Relative: 10 %
Neutro Abs: 2.4 10*3/uL (ref 1.7–7.7)
Neutrophils Relative %: 53 %
Platelet Count: 245 10*3/uL (ref 150–400)
RBC: 4.53 MIL/uL (ref 3.87–5.11)
RDW: 14.2 % (ref 11.5–15.5)
WBC Count: 4.4 10*3/uL (ref 4.0–10.5)
nRBC: 0 % (ref 0.0–0.2)

## 2018-09-03 LAB — CMP (CANCER CENTER ONLY)
ALT: 27 U/L (ref 0–44)
AST: 27 U/L (ref 15–41)
Albumin: 3.8 g/dL (ref 3.5–5.0)
Alkaline Phosphatase: 58 U/L (ref 38–126)
Anion gap: 9 (ref 5–15)
BUN: 16 mg/dL (ref 8–23)
CO2: 28 mmol/L (ref 22–32)
Calcium: 9.7 mg/dL (ref 8.9–10.3)
Chloride: 104 mmol/L (ref 98–111)
Creatinine: 1.11 mg/dL — ABNORMAL HIGH (ref 0.44–1.00)
GFR, Est AFR Am: 60 mL/min — ABNORMAL LOW (ref >60)
GFR, Estimated: 51 mL/min — ABNORMAL LOW (ref >60)
Glucose, Bld: 84 mg/dL (ref 70–99)
Potassium: 4 mmol/L (ref 3.5–5.1)
Sodium: 141 mmol/L (ref 135–145)
Total Bilirubin: 0.4 mg/dL (ref 0.3–1.2)
Total Protein: 7.8 g/dL (ref 6.5–8.1)

## 2018-09-03 NOTE — Telephone Encounter (Signed)
Printed about avs and calender. Gave patient contrast.

## 2018-09-03 NOTE — Progress Notes (Signed)
[facility] [phone]) [phone]   Fax:(336) [phone]  OFFICE PROGRESS NOTE  [name], [place] [address]  DIAGNOSIS: Stage IV (T2 a,N2, M1a) non-small cell lung cancer, adenocarcinoma diagnosed in July 2019 and presented with right upper lobe lung mass in addition to mediastinal lymphadenopathy as well as bilateral pulmonary nodules and malignant right pleural effusion.  Biomarker Findings Microsatellite status - Cannot Be Determined Tumor Mutational Burden - Cannot Be Determined Genomic Findings For a complete list of the genes assayed, please refer to the Appendix. EGFR exon 19 deletion ([id number]) TP53 Y220C 7 Disease relevant genes with no reportable alterations: KRAS, ALK, BRAF, MET, RET, ERBB2, ROS1   PRIOR THERAPY: Status post right Pleurx catheter placement by Dr. Prescott Gum for drainage of malignant right pleural effusion.  CURRENT THERAPY: Tagrisso 80 mg p.o. daily.  First dose was given on 01/29/2018.  Status post 6 months of treatment.  INTERVAL HISTORY: Allison [name] 68 y.o. female returns to the clinic today for follow-up visit.  The patient is feeling fine today with no concerning complaints.  She denied having any current chest pain, shortness of breath, cough or hemoptysis.  She denied having any weight loss or night sweats.  She has no nausea, vomiting, diarrhea or constipation.  She has no headache or visual changes.  The patient is here today for evaluation and repeat blood work.  MEDICAL HISTORY: Past Medical History:  Diagnosis Date  . Anxiety   . Arthritis   . Asthma    exercise induced  . Depression    PMH  . GERD (gastroesophageal reflux disease)   . Glaucoma   . Hypertension   . Malignant pleural effusion    right  . PONV (postoperative nausea and vomiting)   . Pre-diabetes   . Raynaud's disease   . Raynaud's disease     ALLERGIES:  is allergic to penicillins and vicodin  [hydrocodone-acetaminophen].  MEDICATIONS:  Current Outpatient Medications  Medication Sig Dispense Refill  . ascorbic acid (VITAMIN C) 500 MG/5ML syrup Take 1,000 mg by mouth daily.    Marland Kitchen ALPRAZolam (XANAX) 0.25 MG tablet Take 0.38m by mouth once daily as needed for anxiety    . amLODipine (NORVASC) 5 MG tablet Take 5 mg by mouth daily.  3  . dorzolamide-timolol (COSOPT) 22.3-6.8 MG/ML ophthalmic solution Instill 1 drop in both eyes twice daily    . ezetimibe (ZETIA) 10 MG tablet ezetimibe 10 mg tablet  Take 1 tablet every day by oral route.    . hydrochlorothiazide (HYDRODIURIL) 25 MG tablet Take 25 mg by mouth daily.  4  . latanoprost (XALATAN) 0.005 % ophthalmic solution Place 1 drop into both eyes at bedtime.     . Multiple Vitamins-Minerals (MULTIVITAMIN ADULT PO) Take 1 tablet by mouth daily.    . naproxen sodium (ALEVE) 220 MG tablet Take 220 mg by mouth 2 (two) times daily as needed.    .Marland Kitchenosimertinib mesylate (TAGRISSO) 80 MG tablet Take 1 tablet (80 mg total) by mouth daily. Refill Faxed to [facility] 03/26/18 30 tablet 3  . traMADol (ULTRAM) 50 MG tablet Take 50 mg by mouth 2 (two) times daily as needed. for pain  0   No current facility-administered medications for this visit.     SURGICAL HISTORY:  Past Surgical History:  Procedure Laterality Date  . ABDOMINAL HYSTERECTOMY     partial  . CHEST TUBE INSERTION Right 01/01/2018   Procedure: INSERTION  PLEURAL DRAINAGE CATHETER;  Surgeon: Ivin Poot, MD;  Location: [facility];  Service: Thoracic;  Laterality: Right;  . COLONOSCOPY    . DILATION AND CURETTAGE OF UTERUS    . EYE SURGERY     due to Glaucoma  . ROTATOR CUFF REPAIR    . TUBAL LIGATION    . WISDOM TOOTH EXTRACTION      REVIEW OF SYSTEMS:  A comprehensive review of systems was negative.   PHYSICAL EXAMINATION: General appearance: alert, cooperative and no distress Head: Normocephalic, without obvious abnormality, atraumatic Neck: no adenopathy, no JVD, supple,  symmetrical, trachea midline and thyroid not enlarged, symmetric, no tenderness/mass/nodules Lymph nodes: Cervical, supraclavicular, and axillary nodes normal. Resp: clear to auscultation bilaterally Back: symmetric, no curvature. ROM normal. No CVA tenderness. Cardio: regular rate and rhythm, S1, S2 normal, no murmur, click, rub or gallop GI: soft, non-tender; bowel sounds normal; no masses,  no organomegaly Extremities: extremities normal, atraumatic, no cyanosis or edema  ECOG PERFORMANCE STATUS: 1 - Symptomatic but completely ambulatory  Blood pressure (!) 159/75, pulse (!) 57, temperature 98 F (36.7 C), temperature source Oral, resp. rate 20, height _0  (1.626 m), weight 131 lb 12.8 oz (59.8 kg), SpO2 100 %.  LABORATORY DATA: Lab Results  Component Value Date   WBC 4.4 09/03/2018   HGB 11.7 (L) 09/03/2018   HCT 37.6 09/03/2018   MCV 83.0 09/03/2018   PLT 245 09/03/2018      Chemistry      Component Value Date/Time   NA 142 08/05/2018 0831   K 3.6 08/05/2018 0831   CL 106 08/05/2018 0831   CO2 26 08/05/2018 0831   BUN 11 08/05/2018 0831   CREATININE 1.00 08/05/2018 0831      Component Value Date/Time   CALCIUM 9.4 08/05/2018 0831   ALKPHOS 51 08/05/2018 0831   AST 33 08/05/2018 0831   ALT 19 08/05/2018 0831   BILITOT 0.2 (L) 08/05/2018 0831       RADIOGRAPHIC STUDIES: Dg Chest 2 View  Result Date: 08/21/2018 CLINICAL DATA:  68 y/o  F; right pleural effusion. EXAM: CHEST - 2 VIEW COMPARISON:  07/24/2018 chest radiograph FINDINGS: Stable normal cardiac silhouette given projection and technique. Right-sided pleural catheter is stable in position. Stable small right pleural effusion. Stable spiculated nodule within the right lung apex. No new consolidation. No appreciable pneumothorax. Bones are unremarkable. IMPRESSION: Stable small right pleural effusion. Stable right apical spiculated nodule. Stable position of right pleural catheter. Electronically Signed   By:  Kristine Garbe M.D.   On: 08/21/2018 16:29    ASSESSMENT AND PLAN: This is a very pleasant 68 years old never smoker African-American female recently with a stage IV non-small cell lung cancer, adenocarcinoma with positive EGFR mutation with deletion in exon 19. The patient was started on treatment with Tagrisso 80 mg p.o. daily status post 7 months of treatment. She has been tolerating her treatment well with no concerning adverse effects. I recommended for the patient to continue her current treatment with Tagrisso with the same dose. I will see her back for follow-up visit in 1 months for evaluation with repeat CT scan of the chest, abdomen and pelvis. She was advised to call immediately if she has any concerning symptoms in the interval. The patient voices understanding of current disease status and treatment options and is in agreement with the current care plan. All questions were answered. The patient knows to call the clinic with any problems, questions or concerns. We  can certainly see the patient much sooner if necessary.  Disclaimer: This note was dictated with voice recognition software. Similar sounding words can inadvertently be transcribed and may not be corrected upon review.

## 2018-09-04 DIAGNOSIS — R69 Illness, unspecified: Secondary | ICD-10-CM | POA: Diagnosis not present

## 2018-09-25 ENCOUNTER — Encounter: Payer: Medicare HMO | Admitting: Cardiothoracic Surgery

## 2018-09-30 ENCOUNTER — Telehealth: Payer: Self-pay | Admitting: *Deleted

## 2018-09-30 NOTE — Telephone Encounter (Signed)
Medical records faxed to [facility]; [place] [phone]

## 2018-10-01 DIAGNOSIS — C3491 Malignant neoplasm of unspecified part of right bronchus or lung: Secondary | ICD-10-CM | POA: Diagnosis not present

## 2018-10-01 DIAGNOSIS — J910 Malignant pleural effusion: Secondary | ICD-10-CM | POA: Diagnosis not present

## 2018-10-01 DIAGNOSIS — R69 Illness, unspecified: Secondary | ICD-10-CM | POA: Diagnosis not present

## 2018-10-01 DIAGNOSIS — Z4801 Encounter for change or removal of surgical wound dressing: Secondary | ICD-10-CM | POA: Diagnosis not present

## 2018-10-01 DIAGNOSIS — I10 Essential (primary) hypertension: Secondary | ICD-10-CM | POA: Diagnosis not present

## 2018-10-01 DIAGNOSIS — M1991 Primary osteoarthritis, unspecified site: Secondary | ICD-10-CM | POA: Diagnosis not present

## 2018-10-01 DIAGNOSIS — J45909 Unspecified asthma, uncomplicated: Secondary | ICD-10-CM | POA: Diagnosis not present

## 2018-10-01 DIAGNOSIS — Z48813 Encounter for surgical aftercare following surgery on the respiratory system: Secondary | ICD-10-CM | POA: Diagnosis not present

## 2018-10-01 DIAGNOSIS — I7300 Raynaud's syndrome without gangrene: Secondary | ICD-10-CM | POA: Diagnosis not present

## 2018-10-01 DIAGNOSIS — H409 Unspecified glaucoma: Secondary | ICD-10-CM | POA: Diagnosis not present

## 2018-10-04 ENCOUNTER — Ambulatory Visit (HOSPITAL_COMMUNITY)
Admission: RE | Admit: 2018-10-04 | Discharge: 2018-10-04 | Disposition: A | Discharge status: Home / Self Care | Payer: Medicare HMO | Source: Ambulatory Visit | Attending: Internal Medicine | Admitting: Internal Medicine

## 2018-10-04 ENCOUNTER — Other Ambulatory Visit: Payer: Self-pay

## 2018-10-04 ENCOUNTER — Inpatient Hospital Stay: Payer: Medicare HMO | Attending: Internal Medicine

## 2018-10-04 DIAGNOSIS — C3490 Malignant neoplasm of unspecified part of unspecified bronchus or lung: Secondary | ICD-10-CM

## 2018-10-04 DIAGNOSIS — J90 Pleural effusion, not elsewhere classified: Secondary | ICD-10-CM | POA: Diagnosis not present

## 2018-10-04 DIAGNOSIS — J929 Pleural plaque without asbestos: Secondary | ICD-10-CM | POA: Diagnosis not present

## 2018-10-04 DIAGNOSIS — Z85118 Personal history of other malignant neoplasm of bronchus and lung: Secondary | ICD-10-CM | POA: Diagnosis not present

## 2018-10-04 DIAGNOSIS — C3411 Malignant neoplasm of upper lobe, right bronchus or lung: Secondary | ICD-10-CM | POA: Insufficient documentation

## 2018-10-04 DIAGNOSIS — N2889 Other specified disorders of kidney and ureter: Secondary | ICD-10-CM | POA: Diagnosis not present

## 2018-10-04 LAB — CBC WITH DIFFERENTIAL (CANCER CENTER ONLY)
Abs Immature Granulocytes: 0.01 10*3/uL (ref 0.00–0.07)
Basophils Absolute: 0 10*3/uL (ref 0.0–0.1)
Basophils Relative: 0 %
Eosinophils Absolute: 0.1 10*3/uL (ref 0.0–0.5)
Eosinophils Relative: 2 %
HCT: 42.5 % (ref 36.0–46.0)
Hemoglobin: 13.1 g/dL (ref 12.0–15.0)
Immature Granulocytes: 0 %
Lymphocytes Relative: 33 %
Lymphs Abs: 1.5 10*3/uL (ref 0.7–4.0)
MCH: 25.4 pg — ABNORMAL LOW (ref 26.0–34.0)
MCHC: 30.8 g/dL (ref 30.0–36.0)
MCV: 82.5 fL (ref 80.0–100.0)
Monocytes Absolute: 0.5 10*3/uL (ref 0.1–1.0)
Monocytes Relative: 10 %
Neutro Abs: 2.5 10*3/uL (ref 1.7–7.7)
Neutrophils Relative %: 55 %
Platelet Count: 263 10*3/uL (ref 150–400)
RBC: 5.15 MIL/uL — ABNORMAL HIGH (ref 3.87–5.11)
RDW: 14.5 % (ref 11.5–15.5)
WBC Count: 4.6 10*3/uL (ref 4.0–10.5)
nRBC: 0 % (ref 0.0–0.2)

## 2018-10-04 LAB — CMP (CANCER CENTER ONLY)
ALT: 24 U/L (ref 0–44)
AST: 26 U/L (ref 15–41)
Albumin: 4 g/dL (ref 3.5–5.0)
Alkaline Phosphatase: 69 U/L (ref 38–126)
Anion gap: 12 (ref 5–15)
BUN: 12 mg/dL (ref 8–23)
CO2: 27 mmol/L (ref 22–32)
Calcium: 10 mg/dL (ref 8.9–10.3)
Chloride: 104 mmol/L (ref 98–111)
Creatinine: 1.02 mg/dL — ABNORMAL HIGH (ref 0.44–1.00)
GFR, Est AFR Am: >60 mL/min (ref >60)
GFR, Estimated: 57 mL/min — ABNORMAL LOW (ref >60)
Glucose, Bld: 78 mg/dL (ref 70–99)
Potassium: 4.1 mmol/L (ref 3.5–5.1)
Sodium: 143 mmol/L (ref 135–145)
Total Bilirubin: 0.3 mg/dL (ref 0.3–1.2)
Total Protein: 8.4 g/dL — ABNORMAL HIGH (ref 6.5–8.1)

## 2018-10-04 MED ORDER — IOHEXOL 300 MG/ML  SOLN
100.0000 mL | Freq: Once | INTRAMUSCULAR | Status: AC | PRN
  Administered 2018-10-04: 100 mL via INTRAVENOUS

## 2018-10-04 MED ORDER — SODIUM CHLORIDE (PF) 0.9 % IJ SOLN
INTRAMUSCULAR | Status: AC
  Filled 2018-10-04: qty 50

## 2018-10-07 ENCOUNTER — Telehealth: Payer: Self-pay | Admitting: Internal Medicine

## 2018-10-07 NOTE — Telephone Encounter (Signed)
Called member regarding upcoming Webex meeting, test run complete and patient will be forwarding Webex e-mail to her children to join as well on 04/22.   Message to provider.

## 2018-10-09 ENCOUNTER — Encounter: Payer: Self-pay | Admitting: Internal Medicine

## 2018-10-09 ENCOUNTER — Inpatient Hospital Stay (HOSPITAL_BASED_OUTPATIENT_CLINIC_OR_DEPARTMENT_OTHER): Payer: Medicare HMO | Admitting: Internal Medicine

## 2018-10-09 DIAGNOSIS — C771 Secondary and unspecified malignant neoplasm of intrathoracic lymph nodes: Secondary | ICD-10-CM | POA: Diagnosis not present

## 2018-10-09 DIAGNOSIS — C3411 Malignant neoplasm of upper lobe, right bronchus or lung: Secondary | ICD-10-CM

## 2018-10-09 DIAGNOSIS — Z79899 Other long term (current) drug therapy: Secondary | ICD-10-CM

## 2018-10-09 DIAGNOSIS — Z5111 Encounter for antineoplastic chemotherapy: Secondary | ICD-10-CM

## 2018-10-09 DIAGNOSIS — C3491 Malignant neoplasm of unspecified part of right bronchus or lung: Secondary | ICD-10-CM

## 2018-10-09 NOTE — Progress Notes (Signed)
[facility] Telephone:([phone]   Fax:(336) [phone]  PROGRESS NOTE FOR TELEMEDICINE VISITS  Allison [name], [place] [address]  I connected [phone] on 10/09/18 at 11:45 AM EDT by video enabled telemedicine visit and verified that I am speaking with the correct person using two identifiers.   I discussed the limitations, risks, security and privacy concerns of performing an evaluation and management service by telemedicine and the availability of in-person appointments. I also discussed with the patient that there may be a patient responsible charge related to this service. The patient expressed understanding and agreed to proceed.  Other persons participating in the visit and their role in the encounter:  Norton Blizzard, thoracic navigator as well as the patient's boyfriend.  Patient's location: Home Provider's location:  [place]  DIAGNOSIS: Stage IV (T2 a,N2, M1a)non-small cell lung cancer, adenocarcinoma diagnosed in July 2019 and presented with right upper lobe lung mass in addition to mediastinal lymphadenopathy as well as bilateral pulmonary nodules and malignant right pleural effusion.  Biomarker Findings Microsatellite status - Cannot Be Determined Tumor Mutational Burden - Cannot Be Determined Genomic Findings For a complete list of the genes assayed, please refer to the Appendix. EGFR exon 19 deletion ([id number]) TP53 Y220C 7 Disease relevant genes with no reportable alterations: KRAS, ALK, BRAF, MET, RET, ERBB2, ROS1   PRIOR THERAPY: Status post right Pleurx catheter placement by Dr. Prescott Gum for drainage of malignant right pleural effusion.  CURRENT THERAPY: Tagrisso 80 mg p.o. daily.  First dose was given on 01/29/2018.  Status post 8 months of treatment.  INTERVAL HISTORY: Allison [name] 68 y.o. female has a WebEx virtual visit with me today for evaluation and recommendation regarding her  condition and the scan results.  The patient is feeling fine today with no concerning complaints.  She denied having any chest pain, shortness of breath, cough or hemoptysis.  She denied having any fever or chills.  She has no nausea, vomiting, diarrhea or constipation.  She has no headache or visual changes.  She continues to tolerate her treatment with Tagrisso fairly well with no significant adverse effects. She had repeat CT scan of the chest, abdomen and pelvis performed recently and she is here for evaluation and discussion of her scan results.  MEDICAL HISTORY: Past Medical History:  Diagnosis Date  . Anxiety   . Arthritis   . Asthma    exercise induced  . Depression    PMH  . GERD (gastroesophageal reflux disease)   . Glaucoma   . Hypertension   . Malignant pleural effusion    right  . PONV (postoperative nausea and vomiting)   . Pre-diabetes   . Raynaud's disease   . Raynaud's disease     ALLERGIES:  is allergic to penicillins and vicodin [hydrocodone-acetaminophen].  MEDICATIONS:  Current Outpatient Medications  Medication Sig Dispense Refill  . ALPRAZolam (XANAX) 0.25 MG tablet Take 0.59m by mouth once daily as needed for anxiety    . amLODipine (NORVASC) 5 MG tablet Take 5 mg by mouth daily.  3  . ascorbic acid (VITAMIN C) 500 MG/5ML syrup Take 1,000 mg by mouth daily.    . dorzolamide-timolol (COSOPT) 22.3-6.8 MG/ML ophthalmic solution Instill 1 drop in both eyes twice daily    . ezetimibe (ZETIA) 10 MG tablet ezetimibe 10 mg tablet  Take 1 tablet every day by oral route.    . hydrochlorothiazide (HYDRODIURIL) 25 MG tablet Take 25 mg by mouth  daily.  4  . latanoprost (XALATAN) 0.005 % ophthalmic solution Place 1 drop into both eyes at bedtime.     . Multiple Vitamins-Minerals (MULTIVITAMIN ADULT PO) Take 1 tablet by mouth daily.    . naproxen sodium (ALEVE) 220 MG tablet Take 220 mg by mouth 2 (two) times daily as needed.    Marland Kitchen osimertinib mesylate (TAGRISSO) 80 MG  tablet Take 1 tablet (80 mg total) by mouth daily. Refill Faxed to [facility] 03/26/18 30 tablet 3  . traMADol (ULTRAM) 50 MG tablet Take 50 mg by mouth 2 (two) times daily as needed. for pain  0   No current facility-administered medications for this visit.     SURGICAL HISTORY:  Past Surgical History:  Procedure Laterality Date  . ABDOMINAL HYSTERECTOMY     partial  . CHEST TUBE INSERTION Right 01/01/2018   Procedure: INSERTION PLEURAL DRAINAGE CATHETER;  Surgeon: Ivin Poot, MD;  Location: [facility];  Service: Thoracic;  Laterality: Right;  . COLONOSCOPY    . DILATION AND CURETTAGE OF UTERUS    . EYE SURGERY     due to Glaucoma  . ROTATOR CUFF REPAIR    . TUBAL LIGATION    . WISDOM TOOTH EXTRACTION      REVIEW OF SYSTEMS:  Constitutional: negative Eyes: negative Ears, nose, mouth, throat, and face: negative Respiratory: negative Cardiovascular: negative Gastrointestinal: negative Genitourinary:negative Integument/breast: negative Hematologic/lymphatic: negative Musculoskeletal:negative Neurological: negative Behavioral/Psych: negative Endocrine: negative Allergic/Immunologic: negative   LABORATORY DATA: Lab Results  Component Value Date   WBC 4.6 10/04/2018   HGB 13.1 10/04/2018   HCT 42.5 10/04/2018   MCV 82.5 10/04/2018   PLT 263 10/04/2018      Chemistry      Component Value Date/Time   NA 143 10/04/2018 1049   K 4.1 10/04/2018 1049   CL 104 10/04/2018 1049   CO2 27 10/04/2018 1049   BUN 12 10/04/2018 1049   CREATININE 1.02 (H) 10/04/2018 1049      Component Value Date/Time   CALCIUM 10.0 10/04/2018 1049   ALKPHOS 69 10/04/2018 1049   AST 26 10/04/2018 1049   ALT 24 10/04/2018 1049   BILITOT 0.3 10/04/2018 1049       RADIOGRAPHIC STUDIES: Ct Chest W Contrast  Result Date: 10/04/2018 CLINICAL DATA:  68 year old female with history of right sided lung cancer. Oral chemotherapy ongoing. EXAM: CT CHEST, ABDOMEN, AND PELVIS WITH CONTRAST TECHNIQUE:  Multidetector CT imaging of the chest, abdomen and pelvis was performed following the standard protocol during bolus administration of intravenous contrast. CONTRAST:  132m OMNIPAQUE IOHEXOL 300 MG/ML  SOLN COMPARISON:  CT the chest, abdomen and pelvis 06/28/2018. FINDINGS: CT CHEST FINDINGS Cardiovascular: Heart size is normal. There is no significant pericardial fluid, thickening or pericardial calcification. Aortic atherosclerosis. No definite coronary artery calcifications. Mediastinum/Nodes: No pathologically enlarged mediastinal or hilar lymph nodes. Esophagus is unremarkable in appearance. No axillary lymphadenopathy. Lungs/Pleura: Treated mass in the right upper lobe near the apex appears roughly stable in size compared to the prior examination, currently measuring 2.0 x 3.0 cm (axial image 35 of series 4). 2 mm pulmonary nodule in the periphery of the left lower lobe (axial image 98 of series 4), stable compared to the prior study. No other larger more suspicious appearing pulmonary nodules or masses are noted. No acute consolidative airspace disease. Trace right pleural effusion and chronic pleural thickening, similar to the prior examination, with PleurX catheter in position with tip in the posterior aspect of the apex of  the right hemithorax. Musculoskeletal: There are no aggressive appearing lytic or blastic lesions noted in the visualized portions of the skeleton. CT ABDOMEN PELVIS FINDINGS Hepatobiliary: No suspicious cystic or solid hepatic lesions. No intra or extrahepatic biliary ductal dilatation. Gallbladder is normal in appearance. Pancreas: No pancreatic mass. No pancreatic ductal dilatation. No pancreatic or peripancreatic fluid or inflammatory changes. Spleen: Unremarkable. Adrenals/Urinary Tract: Subcentimeter low-attenuation lesions in the right kidney, too small to characterize, but statistically likely to represent tiny cysts. Mild cortical scarring in the upper pole of the left kidney.  Bilateral adrenal glands are normal in appearance. No hydroureteronephrosis. Urinary bladder is normal in appearance. Stomach/Bowel: The appearance of the stomach is normal. No pathologic dilatation of small bowel or colon. The appendix is not confidently identified and may be surgically absent. Regardless, there are no inflammatory changes noted adjacent to the cecum to suggest the presence of an acute appendicitis at this time. Vascular/Lymphatic: Aortic atherosclerosis, without evidence of aneurysm or dissection in the abdominal or pelvic vasculature. No lymphadenopathy noted in the abdomen or pelvis. Reproductive: Status post hysterectomy. Ovaries are not confidently identified may be surgically absent or atrophic. Other: No significant volume of ascites.  No pneumoperitoneum. Musculoskeletal: There are no aggressive appearing lytic or blastic lesions noted in the visualized portions of the skeleton. IMPRESSION: 1. Stable appearance of treated mass near the apex of the right upper lobe. Chronic trace right pleural effusion and pleural thickening, stable compared to the prior examination with indwelling PleurX catheter in place. 2. No signs of metastatic disease in the abdomen or pelvis. 3. Aortic atherosclerosis. 4. Additional incidental findings, as above. Electronically Signed   By: Vinnie Langton M.D.   On: 10/04/2018 15:05   Ct Abdomen Pelvis W Contrast  Result Date: 10/04/2018 CLINICAL DATA:  68 year old female with history of right sided lung cancer. Oral chemotherapy ongoing. EXAM: CT CHEST, ABDOMEN, AND PELVIS WITH CONTRAST TECHNIQUE: Multidetector CT imaging of the chest, abdomen and pelvis was performed following the standard protocol during bolus administration of intravenous contrast. CONTRAST:  154m OMNIPAQUE IOHEXOL 300 MG/ML  SOLN COMPARISON:  CT the chest, abdomen and pelvis 06/28/2018. FINDINGS: CT CHEST FINDINGS Cardiovascular: Heart size is normal. There is no significant pericardial  fluid, thickening or pericardial calcification. Aortic atherosclerosis. No definite coronary artery calcifications. Mediastinum/Nodes: No pathologically enlarged mediastinal or hilar lymph nodes. Esophagus is unremarkable in appearance. No axillary lymphadenopathy. Lungs/Pleura: Treated mass in the right upper lobe near the apex appears roughly stable in size compared to the prior examination, currently measuring 2.0 x 3.0 cm (axial image 35 of series 4). 2 mm pulmonary nodule in the periphery of the left lower lobe (axial image 98 of series 4), stable compared to the prior study. No other larger more suspicious appearing pulmonary nodules or masses are noted. No acute consolidative airspace disease. Trace right pleural effusion and chronic pleural thickening, similar to the prior examination, with PleurX catheter in position with tip in the posterior aspect of the apex of the right hemithorax. Musculoskeletal: There are no aggressive appearing lytic or blastic lesions noted in the visualized portions of the skeleton. CT ABDOMEN PELVIS FINDINGS Hepatobiliary: No suspicious cystic or solid hepatic lesions. No intra or extrahepatic biliary ductal dilatation. Gallbladder is normal in appearance. Pancreas: No pancreatic mass. No pancreatic ductal dilatation. No pancreatic or peripancreatic fluid or inflammatory changes. Spleen: Unremarkable. Adrenals/Urinary Tract: Subcentimeter low-attenuation lesions in the right kidney, too small to characterize, but statistically likely to represent tiny cysts. Mild cortical scarring in  the upper pole of the left kidney. Bilateral adrenal glands are normal in appearance. No hydroureteronephrosis. Urinary bladder is normal in appearance. Stomach/Bowel: The appearance of the stomach is normal. No pathologic dilatation of small bowel or colon. The appendix is not confidently identified and may be surgically absent. Regardless, there are no inflammatory changes noted adjacent to the  cecum to suggest the presence of an acute appendicitis at this time. Vascular/Lymphatic: Aortic atherosclerosis, without evidence of aneurysm or dissection in the abdominal or pelvic vasculature. No lymphadenopathy noted in the abdomen or pelvis. Reproductive: Status post hysterectomy. Ovaries are not confidently identified may be surgically absent or atrophic. Other: No significant volume of ascites.  No pneumoperitoneum. Musculoskeletal: There are no aggressive appearing lytic or blastic lesions noted in the visualized portions of the skeleton. IMPRESSION: 1. Stable appearance of treated mass near the apex of the right upper lobe. Chronic trace right pleural effusion and pleural thickening, stable compared to the prior examination with indwelling PleurX catheter in place. 2. No signs of metastatic disease in the abdomen or pelvis. 3. Aortic atherosclerosis. 4. Additional incidental findings, as above. Electronically Signed   By: Vinnie Langton M.D.   On: 10/04/2018 15:05    ASSESSMENT AND PLAN: This is a very pleasant 68 years old never smoker African-American female recently with a stage IV non-small cell lung cancer, adenocarcinoma with positive EGFR mutation with deletion in exon 19. The patient was started on treatment with Tagrisso 80 mg p.o. daily status post 8 months of treatment. The patient continues to tolerate this treatment well with no concerning adverse effects. She had repeat CT scan of the chest, abdomen and pelvis. I personally and independently reviewed the scans and discussed the results with the patient and her boyfriend today. Her scan showed no concerning findings for disease progression. I recommended for the patient to continue her current treatment with Tagrisso the same dose. I will see her back for follow-up visit in 6 weeks for evaluation with repeat blood work. She was advised to call immediately if she has any concerning symptoms in the interval. I discussed the  assessment and treatment plan with the patient. The patient was provided an opportunity to ask questions and all were answered. The patient agreed with the plan and demonstrated an understanding of the instructions.   The patient was advised to call back or seek an in-person evaluation if the symptoms worsen or if the condition fails to improve as anticipated.  I provided 15 minutes of face-to-face video visit time during this encounter, and > 50% was spent counseling as documented under my assessment & plan.  Eilleen Kempf, MD 10/09/2018 11:35 AM  Disclaimer: This note was dictated with voice recognition software. Similar sounding words can inadvertently be transcribed and may not be corrected upon review.

## 2018-10-10 ENCOUNTER — Telehealth: Payer: Self-pay | Admitting: Internal Medicine

## 2018-10-10 NOTE — Telephone Encounter (Signed)
Called regarding schedule °

## 2018-10-21 DIAGNOSIS — I10 Essential (primary) hypertension: Secondary | ICD-10-CM | POA: Diagnosis not present

## 2018-10-21 DIAGNOSIS — C3491 Malignant neoplasm of unspecified part of right bronchus or lung: Secondary | ICD-10-CM | POA: Diagnosis not present

## 2018-10-22 ENCOUNTER — Other Ambulatory Visit: Payer: Self-pay

## 2018-10-22 ENCOUNTER — Other Ambulatory Visit: Payer: Self-pay | Admitting: Cardiothoracic Surgery

## 2018-10-22 DIAGNOSIS — C3491 Malignant neoplasm of unspecified part of right bronchus or lung: Secondary | ICD-10-CM

## 2018-10-23 ENCOUNTER — Encounter: Payer: Self-pay | Admitting: Cardiothoracic Surgery

## 2018-10-23 ENCOUNTER — Ambulatory Visit: Payer: Medicare HMO | Admitting: Cardiothoracic Surgery

## 2018-10-23 ENCOUNTER — Ambulatory Visit
Admission: RE | Admit: 2018-10-23 | Discharge: 2018-10-23 | Disposition: A | Discharge status: Home / Self Care | Payer: Medicare HMO | Source: Ambulatory Visit | Attending: Cardiothoracic Surgery | Admitting: Cardiothoracic Surgery

## 2018-10-23 VITALS — BP 134/72 | HR 64 | Temp 97.7°F | Resp 16 | Ht 64.0 in | Wt 129.0 lb

## 2018-10-23 DIAGNOSIS — Z9689 Presence of other specified functional implants: Secondary | ICD-10-CM

## 2018-10-23 DIAGNOSIS — J90 Pleural effusion, not elsewhere classified: Secondary | ICD-10-CM | POA: Diagnosis not present

## 2018-10-23 DIAGNOSIS — J910 Malignant pleural effusion: Secondary | ICD-10-CM | POA: Diagnosis not present

## 2018-10-23 DIAGNOSIS — C3491 Malignant neoplasm of unspecified part of right bronchus or lung: Secondary | ICD-10-CM

## 2018-10-23 NOTE — Progress Notes (Signed)
PCP is [name], MD Referring Provider is [name], MD  Chief Complaint  Patient presents with  . Pleural Effusion    f/u with CXR to assess R pl eff/pleurX    HPI: Patient presents for scheduled right Pleurx catheter check.  She has a malignant right pleural effusion.  He has been to half months since her last visit.  Lately the drainage has significantly dropped.  Last 2 weeks were 50 and 70 cc.  She is still taking Tagrisso and followed by Dr. [name].  Chest x-ray today shows minimal right pleural effusion.  The catheter remains in good position.  She will wait 2 weeks until her next drainage and if the volume is less than 100 cc we will schedule outpatient removal of the right Pleurx catheter.  Past Medical History:  Diagnosis Date  . Anxiety   . Arthritis   . Asthma    exercise induced  . Depression    PMH  . GERD (gastroesophageal reflux disease)   . Glaucoma   . Hypertension   . Malignant pleural effusion    right  . PONV (postoperative nausea and vomiting)   . Pre-diabetes   . Raynaud's disease   . Raynaud's disease     Past Surgical History:  Procedure Laterality Date  . ABDOMINAL HYSTERECTOMY     partial  . CHEST TUBE INSERTION Right 01/01/2018   Procedure: INSERTION PLEURAL DRAINAGE CATHETER;  Surgeon: Ivin Poot, MD;  Location: [facility];  Service: Thoracic;  Laterality: Right;  . COLONOSCOPY    . DILATION AND CURETTAGE OF UTERUS    . EYE SURGERY     due to Glaucoma  . ROTATOR CUFF REPAIR    . TUBAL LIGATION    . WISDOM TOOTH EXTRACTION      Family History  Problem Relation Age of Onset  . Heart disease Sister   . Heart disease Brother   . Lung cancer Other     Social History Social History   Tobacco Use  . Smoking status: Never Smoker  . Smokeless tobacco: Never Used  Substance Use Topics  . Alcohol use: Yes    Comment: up to 3 drinks per week  . Drug use: No    Comment: CBD oil     Current Outpatient Medications   Medication Sig Dispense Refill  . acetaminophen (TYLENOL) 500 MG tablet Take 1,000 mg by mouth every 6 (six) hours as needed.    . ALPRAZolam (XANAX) 0.25 MG tablet Take 0.25mg  by mouth once daily as needed for anxiety    . amLODipine (NORVASC) 5 MG tablet Take 5 mg by mouth daily.  3  . ascorbic acid (VITAMIN C) 500 MG/5ML syrup Take 1,000 mg by mouth daily.    . dorzolamide-timolol (COSOPT) 22.3-6.8 MG/ML ophthalmic solution Instill 1 drop in both eyes twice daily    . ezetimibe (ZETIA) 10 MG tablet ezetimibe 10 mg tablet  Take 1 tablet every day by oral route.    . hydrochlorothiazide (HYDRODIURIL) 25 MG tablet Take 25 mg by mouth daily.  4  . latanoprost (XALATAN) 0.005 % ophthalmic solution Place 1 drop into both eyes at bedtime.     . Multiple Vitamins-Minerals (MULTIVITAMIN ADULT PO) Take 1 tablet by mouth daily.    Marland Kitchen osimertinib mesylate (TAGRISSO) 80 MG tablet Take 1 tablet (80 mg total) by mouth daily. Refill Faxed to [facility] 03/26/18 30 tablet 3  . traMADol (ULTRAM) 50 MG tablet Take 50 mg by mouth 2 (two)  times daily as needed. for pain  0  . Vitamin D, Cholecalciferol, 25 MCG (1000 UT) TABS Take 1 capsule by mouth daily.     No current facility-administered medications for this visit.     Allergies  Allergen Reactions  . Penicillins Other (See Comments)    SYNCOPE PATIENT HAS HAD A PCN REACTION WITH IMMEDIATE RASH, FACIAL/TONGUE/THROAT SWELLING, SOB, OR LIGHTHEADEDNESS WITH HYPOTENSION:  #  #  YES  #  # Has patient had a PCN reaction causing severe rash involving mucus membranes or skin necrosis: No Has patient had a PCN reaction that required hospitalization: No Has patient had a PCN reaction occurring within the last 10 years: No   . Vicodin [Hydrocodone-Acetaminophen] Other (See Comments)    Sped up heart and breathing    Review of Systems   No symptoms of fever chills cough new onset shortness of breath nausea lack of taste or smell-symptoms of COVID.  No history of  exposure to COVID.  BP 134/72 (BP Location: Right Arm, Patient Position: Sitting, Cuff Size: Normal)   Pulse 64   Temp 97.7 F (36.5 C) (Skin)   Resp 16   Ht 5\' 4"  (1.626 m)   Wt 129 lb (58.5 kg)   SpO2 96% Comment: RA  BMI 22.14 kg/m  Physical Exam Breath sounds clear and equal Pleurx catheter clean and dry  Diagnostic Tests: Chest x-ray today personally viewed showing mild blunting of the right costophrenic angle catheter good position.  Impression: Patient will call after a 2-week interval between drainage procedures.  If 100 cc or less we will schedule outpatient Pleurx catheter removal.  Plan: The patient will call us after the drainage on May 18.  Proceed with catheter removal based on volume removed.   Len Childs, MD [facility] [phone]

## 2018-10-28 DIAGNOSIS — H409 Unspecified glaucoma: Secondary | ICD-10-CM | POA: Diagnosis not present

## 2018-10-28 DIAGNOSIS — M1991 Primary osteoarthritis, unspecified site: Secondary | ICD-10-CM | POA: Diagnosis not present

## 2018-10-28 DIAGNOSIS — J45909 Unspecified asthma, uncomplicated: Secondary | ICD-10-CM | POA: Diagnosis not present

## 2018-10-28 DIAGNOSIS — I7300 Raynaud's syndrome without gangrene: Secondary | ICD-10-CM | POA: Diagnosis not present

## 2018-10-28 DIAGNOSIS — Z48813 Encounter for surgical aftercare following surgery on the respiratory system: Secondary | ICD-10-CM | POA: Diagnosis not present

## 2018-10-28 DIAGNOSIS — R69 Illness, unspecified: Secondary | ICD-10-CM | POA: Diagnosis not present

## 2018-10-28 DIAGNOSIS — J910 Malignant pleural effusion: Secondary | ICD-10-CM | POA: Diagnosis not present

## 2018-10-28 DIAGNOSIS — C3491 Malignant neoplasm of unspecified part of right bronchus or lung: Secondary | ICD-10-CM | POA: Diagnosis not present

## 2018-10-28 DIAGNOSIS — I10 Essential (primary) hypertension: Secondary | ICD-10-CM | POA: Diagnosis not present

## 2018-10-28 DIAGNOSIS — Z4801 Encounter for change or removal of surgical wound dressing: Secondary | ICD-10-CM | POA: Diagnosis not present

## 2018-11-05 ENCOUNTER — Other Ambulatory Visit: Payer: Self-pay | Admitting: *Deleted

## 2018-11-05 DIAGNOSIS — J90 Pleural effusion, not elsewhere classified: Secondary | ICD-10-CM

## 2018-11-06 DIAGNOSIS — R69 Illness, unspecified: Secondary | ICD-10-CM | POA: Diagnosis not present

## 2018-11-07 ENCOUNTER — Ambulatory Visit (HOSPITAL_COMMUNITY)
Admission: RE | Admit: 2018-11-07 | Discharge: 2018-11-07 | Disposition: A | Discharge status: Home / Self Care | Payer: Medicare HMO | Attending: Cardiothoracic Surgery | Admitting: Cardiothoracic Surgery

## 2018-11-07 ENCOUNTER — Encounter (HOSPITAL_COMMUNITY)
Admission: RE | Disposition: A | Discharge status: Home / Self Care | Payer: Self-pay | Source: Home / Self Care | Attending: Cardiothoracic Surgery

## 2018-11-07 ENCOUNTER — Encounter (HOSPITAL_COMMUNITY): Payer: Self-pay | Admitting: *Deleted

## 2018-11-07 DIAGNOSIS — I10 Essential (primary) hypertension: Secondary | ICD-10-CM | POA: Insufficient documentation

## 2018-11-07 DIAGNOSIS — R7303 Prediabetes: Secondary | ICD-10-CM | POA: Insufficient documentation

## 2018-11-07 DIAGNOSIS — H409 Unspecified glaucoma: Secondary | ICD-10-CM | POA: Insufficient documentation

## 2018-11-07 DIAGNOSIS — Z4803 Encounter for change or removal of drains: Secondary | ICD-10-CM | POA: Insufficient documentation

## 2018-11-07 DIAGNOSIS — J910 Malignant pleural effusion: Secondary | ICD-10-CM

## 2018-11-07 DIAGNOSIS — M1990 Unspecified osteoarthritis, unspecified site: Secondary | ICD-10-CM | POA: Insufficient documentation

## 2018-11-07 DIAGNOSIS — Z880 Allergy status to penicillin: Secondary | ICD-10-CM | POA: Diagnosis not present

## 2018-11-07 DIAGNOSIS — F419 Anxiety disorder, unspecified: Secondary | ICD-10-CM | POA: Diagnosis not present

## 2018-11-07 DIAGNOSIS — Z79899 Other long term (current) drug therapy: Secondary | ICD-10-CM | POA: Insufficient documentation

## 2018-11-07 DIAGNOSIS — J90 Pleural effusion, not elsewhere classified: Secondary | ICD-10-CM

## 2018-11-07 DIAGNOSIS — C3491 Malignant neoplasm of unspecified part of right bronchus or lung: Secondary | ICD-10-CM | POA: Insufficient documentation

## 2018-11-07 DIAGNOSIS — R69 Illness, unspecified: Secondary | ICD-10-CM | POA: Diagnosis not present

## 2018-11-07 HISTORY — PX: REMOVAL OF PLEURAL DRAINAGE CATHETER: SHX5080

## 2018-11-07 SURGERY — REMOVAL, CLOSED DRAINAGE CATHETER SYSTEM, PLEURAL
Anesthesia: Monitor Anesthesia Care | Laterality: Right

## 2018-11-07 MED ORDER — LIDOCAINE HCL 2 % IJ SOLN
INTRAMUSCULAR | Status: AC
  Filled 2018-11-07: qty 20

## 2018-11-07 NOTE — Op Note (Signed)
Procedure- removal of right Pleurx catheter Surgeon-Berel Najjar MD Anesthesia- 2% lidocaine local  Pre-and postop diagnosis- non-small cell carcinoma of the right lung with malignant pleural effusion resolved on chemotherapy  The patient was examined in the treatment room and informed consent was documented and all questions addressed.  The proper site was marked.  A proper timeout was performed.  The patient's right chest was prepped and draped after the Pleurx catheter was drained of 30 mL's of clear fluid.  Local 1% lidocaine was infiltrated around the exit site.  8 mL's were used.  After the site was adequately anesthetized the Dacron cuff on the internal portion of the catheter was separated from the subcutaneous tissues.  The catheter was then removed in its entirety without difficulty.  Two 3-0 sutures were placed to close the exit site and a sterile dressing was placed.  The patient is to be observed for the next 20 minutes to check vital signs and to assess for bleeding at the site of surgery.  The patient will return to the office on June 3 for removal of the exit site sutures.  Instructions were provided the patient and wound care and activity limitations.

## 2018-11-07 NOTE — H&P (Signed)
[id number]       [place] [address]             [phone]        Allison [name]  Medical Record #[id number] Date of Birth: 1951/05/24  Referring: No ref. provider found Primary Care: [name], MD Primary Cardiologist:No primary care provider on file.  Chief Complaint:   No chief complaint on file. The patient presents for removal of right Pleurx catheter  History of Present Illness:     History of non-small cell carcinoma right lung with malignant effusion.  Right Pleurx catheter placed 10 months ago.  Patient has been receiving chemotherapy-Tagrisso with good response.  Right pleural effusion has resolved.  Pleurx catheter to be removed today.   Current Activity/ Functional Status: Patient tolerating normal daily activities   Zubrod Score: At the time of surgery this patient's most appropriate activity status/level should be described as: []     0    Normal activity, no symptoms [x]     1    Restricted in physical strenuous activity but ambulatory, able to do out light work []     2    Ambulatory and capable of self care, unable to do work activities, up and about                 more than 50%  Of the time                            []     3    Only limited self care, in bed greater than 50% of waking hours []     4    Completely disabled, no self care, confined to bed or chair []     5    Moribund  Past Medical History:  Diagnosis Date  . Anxiety   . Arthritis   . Asthma    exercise induced  . Depression    PMH  . GERD (gastroesophageal reflux disease)   . Glaucoma   . Hypertension   . Malignant pleural effusion    right  . PONV (postoperative nausea and vomiting)   . Pre-diabetes   . Raynaud's disease   . Raynaud's disease     Past Surgical History:  Procedure Laterality Date  . ABDOMINAL HYSTERECTOMY     partial  . CHEST TUBE INSERTION Right 01/01/2018   Procedure: INSERTION PLEURAL DRAINAGE CATHETER;  Surgeon: Ivin Poot, MD;  Location: [facility];  Service: Thoracic;  Laterality: Right;  . COLONOSCOPY    . DILATION AND CURETTAGE OF UTERUS    . EYE SURGERY     due to Glaucoma  . ROTATOR CUFF REPAIR    . TUBAL LIGATION    . WISDOM TOOTH EXTRACTION      Social History   Tobacco Use  Smoking Status Never Smoker  Smokeless Tobacco Never Used    Social History   Substance and Sexual Activity  Alcohol Use Yes   Comment: up to 3 drinks per week     Allergies  Allergen Reactions  . Penicillins Other (See Comments)    SYNCOPE PATIENT HAS HAD A PCN REACTION WITH IMMEDIATE RASH, FACIAL/TONGUE/THROAT SWELLING, SOB, OR LIGHTHEADEDNESS WITH HYPOTENSION:  #  #  YES  #  # Has patient had a PCN reaction causing severe rash involving mucus membranes or skin necrosis: No Has patient had a PCN reaction that required hospitalization:  No Has patient had a PCN reaction occurring within the last 10 years: No   . Vicodin [Hydrocodone-Acetaminophen] Other (See Comments)    Sped up heart and breathing    Current Facility-Administered Medications  Medication Dose Route Frequency Provider Last Rate Last Dose  . lidocaine (XYLOCAINE) 2 % (with pres) injection             Medications Prior to Admission  Medication Sig Dispense Refill Last Dose  . acetaminophen (TYLENOL) 500 MG tablet Take 1,000 mg by mouth every 6 (six) hours as needed (pain).    Taking  . ALPRAZolam (XANAX) 0.25 MG tablet Take 0.25 mg by mouth 2 (two) times daily as needed for anxiety.    Taking  . amLODipine (NORVASC) 5 MG tablet Take 5 mg by mouth daily.  3 11/07/2018 at 0800  . ascorbic acid (VITAMIN C) 500 MG/5ML syrup Take 1,000 mg by mouth daily.   Taking  . Black Elderberry (SAMBUCUS ELDERBERRY) 50 MG/5ML SYRP Take 5 mLs by mouth daily.     . dorzolamide-timolol (COSOPT) 22.3-6.8 MG/ML ophthalmic solution Instill 1 drop in both eyes twice daily   Taking  . ezetimibe (ZETIA) 10 MG tablet Take 10 mg by mouth daily.    11/07/2018 at  [id number]  . hydrochlorothiazide (HYDRODIURIL) 25 MG tablet Take 25 mg by mouth daily.  4 11/07/2018 at 0800  . latanoprost (XALATAN) 0.005 % ophthalmic solution Place 1 drop into both eyes at bedtime.    Taking  . Melatonin 5 MG CAPS Take 10 mg by mouth at bedtime as needed (sleep).     . Multiple Vitamins-Minerals (MULTIVITAMIN ADULT PO) Take 1 tablet by mouth every other day.    Taking  . osimertinib mesylate (TAGRISSO) 80 MG tablet Take 1 tablet (80 mg total) by mouth daily. Refill Faxed to [facility] 03/26/18 30 tablet 3 Taking  . traMADol (ULTRAM) 50 MG tablet Take 50 mg by mouth 2 (two) times daily as needed. for pain  0 11/07/2018 at 1040  . VITAMIN D, CHOLECALCIFEROL, PO Take 5,000 Units by mouth daily.    11/07/2018 at 0800    Family History  Problem Relation Age of Onset  . Heart disease Sister   . Heart disease Brother   . Lung cancer Other      Review of Systems:   ROS No fever No shortness of breath or cough No drainage from Pleurx catheter site Receiving Tagrisso under the direction of Dr. [name]    Cardiac Review of Systems: Y or  [    ]= no  Chest Pain [    ]  Resting SOB [   ] Exertional SOB  [  ]  Orthopnea [  ]   Pedal Edema [   ]    Palpitations [  ] Syncope  [  ]   Presyncope [   ]  General Review of Systems: [Y] = yes [  ]=no Constitional: recent weight change [  ]; anorexia [  ]; fatigue [  ]; nausea [  ]; night sweats [  ]; fever [  ]; or chills [  ]                                                               Dental:  Last Dentist visit:   Eye : blurred vision [  ]; diplopia [   ]; vision changes [  ];  Amaurosis fugax[  ]; Resp: cough [  ];  wheezing[  ];  hemoptysis[  ]; shortness of breath[  ]; paroxysmal nocturnal dyspnea[  ]; dyspnea on exertion[  ]; or orthopnea[  ];  GI:  gallstones[  ], vomiting[  ];  dysphagia[  ]; melena[  ];  hematochezia [  ]; heartburn[  ];   Hx of  Colonoscopy[  ]; GU: kidney stones [  ]; hematuria[  ];   dysuria [  ];  nocturia[  ];   history of     obstruction [  ]; urinary frequency [  ]             Skin: rash, swelling[  ];, hair loss[  ];  peripheral edema[  ];  or itching[  ]; Musculosketetal: myalgias[  ];  joint swelling[  ];  joint erythema[  ];  joint pain[  ];  back pain[  ];  Heme/Lymph: bruising[  ];  bleeding[  ];  anemia[  ];  Neuro: TIA[  ];  headaches[  ];  stroke[  ];  vertigo[  ];  seizures[  ];   paresthesias[  ];  difficulty walking[  ];  Psych:depression[  ]; anxiety[  ];  Endocrine: diabetes[  ];  thyroid dysfunction[  ];              Physical Exam: LMP  (LMP Unknown)         Exam    General- alert and comfortable    Neck- no JVD, no cervical adenopathy palpable, no carotid bruit   Lungs- clear without rales, wheezes   Cor- regular rate and rhythm, no murmur , gallop   Abdomen- soft, non-tender   Extremities - warm, non-tender, minimal edema   Neuro- oriented, appropriate, no focal weakness   Diagnostic Studies & Laboratory data:     Recent Radiology Findings:   No results found.   I have independently reviewed the above radiologic studies and discussed with the patient   Recent Lab Findings: Lab Results  Component Value Date   WBC 4.6 10/04/2018   HGB 13.1 10/04/2018   HCT 42.5 10/04/2018   PLT 263 10/04/2018   GLUCOSE 78 10/04/2018   ALT 24 10/04/2018   AST 26 10/04/2018   NA 143 10/04/2018   K 4.1 10/04/2018   CL 104 10/04/2018   CREATININE 1.02 (H) 10/04/2018   BUN 12 10/04/2018   CO2 27 10/04/2018   INR 0.98 01/01/2018      Assessment / Plan:      Plan removal of right Pleurx catheter today in outpatient treatment room      11/07/2018 11:19 AM

## 2018-11-08 ENCOUNTER — Encounter (HOSPITAL_COMMUNITY): Payer: Self-pay | Admitting: Cardiothoracic Surgery

## 2018-11-20 ENCOUNTER — Ambulatory Visit: Payer: Medicare HMO | Admitting: Cardiothoracic Surgery

## 2018-11-20 ENCOUNTER — Encounter: Payer: Self-pay | Admitting: Internal Medicine

## 2018-11-20 ENCOUNTER — Inpatient Hospital Stay: Payer: Medicare HMO

## 2018-11-20 ENCOUNTER — Telehealth: Payer: Self-pay | Admitting: Medical Oncology

## 2018-11-20 ENCOUNTER — Inpatient Hospital Stay: Payer: Medicare HMO | Attending: Internal Medicine | Admitting: Internal Medicine

## 2018-11-20 ENCOUNTER — Other Ambulatory Visit: Payer: Self-pay | Admitting: Cardiothoracic Surgery

## 2018-11-20 ENCOUNTER — Other Ambulatory Visit: Payer: Self-pay

## 2018-11-20 VITALS — BP 144/64 | HR 55 | Temp 98.5°F | Resp 20 | Ht 64.0 in | Wt 132.3 lb

## 2018-11-20 DIAGNOSIS — R197 Diarrhea, unspecified: Secondary | ICD-10-CM | POA: Insufficient documentation

## 2018-11-20 DIAGNOSIS — I10 Essential (primary) hypertension: Secondary | ICD-10-CM | POA: Insufficient documentation

## 2018-11-20 DIAGNOSIS — J910 Malignant pleural effusion: Secondary | ICD-10-CM | POA: Insufficient documentation

## 2018-11-20 DIAGNOSIS — J90 Pleural effusion, not elsewhere classified: Secondary | ICD-10-CM

## 2018-11-20 DIAGNOSIS — C3491 Malignant neoplasm of unspecified part of right bronchus or lung: Secondary | ICD-10-CM

## 2018-11-20 DIAGNOSIS — Z5111 Encounter for antineoplastic chemotherapy: Secondary | ICD-10-CM

## 2018-11-20 DIAGNOSIS — C3411 Malignant neoplasm of upper lobe, right bronchus or lung: Secondary | ICD-10-CM | POA: Diagnosis not present

## 2018-11-20 LAB — CBC WITH DIFFERENTIAL (CANCER CENTER ONLY)
Abs Immature Granulocytes: 0.01 10*3/uL (ref 0.00–0.07)
Basophils Absolute: 0 10*3/uL (ref 0.0–0.1)
Basophils Relative: 1 %
Eosinophils Absolute: 0.1 10*3/uL (ref 0.0–0.5)
Eosinophils Relative: 3 %
HCT: 38.5 % (ref 36.0–46.0)
Hemoglobin: 11.9 g/dL — ABNORMAL LOW (ref 12.0–15.0)
Immature Granulocytes: 0 %
Lymphocytes Relative: 40 %
Lymphs Abs: 1.2 10*3/uL (ref 0.7–4.0)
MCH: 25.6 pg — ABNORMAL LOW (ref 26.0–34.0)
MCHC: 30.9 g/dL (ref 30.0–36.0)
MCV: 82.8 fL (ref 80.0–100.0)
Monocytes Absolute: 0.3 10*3/uL (ref 0.1–1.0)
Monocytes Relative: 10 %
Neutro Abs: 1.4 10*3/uL — ABNORMAL LOW (ref 1.7–7.7)
Neutrophils Relative %: 46 %
Platelet Count: 215 10*3/uL (ref 150–400)
RBC: 4.65 MIL/uL (ref 3.87–5.11)
RDW: 15.5 % (ref 11.5–15.5)
WBC Count: 3.1 10*3/uL — ABNORMAL LOW (ref 4.0–10.5)
nRBC: 0 % (ref 0.0–0.2)

## 2018-11-20 LAB — CMP (CANCER CENTER ONLY)
ALT: 24 U/L (ref 0–44)
AST: 25 U/L (ref 15–41)
Albumin: 3.7 g/dL (ref 3.5–5.0)
Alkaline Phosphatase: 57 U/L (ref 38–126)
Anion gap: 9 (ref 5–15)
BUN: 11 mg/dL (ref 8–23)
CO2: 27 mmol/L (ref 22–32)
Calcium: 9.1 mg/dL (ref 8.9–10.3)
Chloride: 107 mmol/L (ref 98–111)
Creatinine: 0.95 mg/dL (ref 0.44–1.00)
GFR, Est AFR Am: >60 mL/min (ref >60)
GFR, Estimated: >60 mL/min (ref >60)
Glucose, Bld: 77 mg/dL (ref 70–99)
Potassium: 3.2 mmol/L — ABNORMAL LOW (ref 3.5–5.1)
Sodium: 143 mmol/L (ref 135–145)
Total Bilirubin: 0.4 mg/dL (ref 0.3–1.2)
Total Protein: 7.4 g/dL (ref 6.5–8.1)

## 2018-11-20 NOTE — Progress Notes (Signed)
[facility] Telephone:([phone]) [phone]   Fax:(336) [phone]  OFFICE PROGRESS NOTE  [name], [place] [address]  DIAGNOSIS: Stage IV (T2 a,N2, M1a) non-small cell lung cancer, adenocarcinoma diagnosed in July 2019 and presented with right upper lobe lung mass in addition to mediastinal lymphadenopathy as well as bilateral pulmonary nodules and malignant right pleural effusion.  Biomarker Findings Microsatellite status - Cannot Be Determined Tumor Mutational Burden - Cannot Be Determined Genomic Findings For a complete list of the genes assayed, please refer to the Appendix. EGFR exon 19 deletion ([id number]) TP53 Y220C 7 Disease relevant genes with no reportable alterations: KRAS, ALK, BRAF, MET, RET, ERBB2, ROS1   PRIOR THERAPY: Status post right Pleurx catheter placement by Dr. Prescott Gum for drainage of malignant right pleural effusion.  CURRENT THERAPY: Tagrisso 80 mg p.o. daily.  First dose was given on 01/29/2018.  Status post 10 months of treatment.  INTERVAL HISTORY: Allison [name] 68 y.o. female returns to the clinic today for follow-up visit.  The patient was fine today with no concerning complaints except for mild shortness of breath with exertion.  He had the Pleurx catheter removed 2 weeks ago.  The patient denied having any chest pain, cough or hemoptysis.  She denied having any fever or chills.  She has no nausea, vomiting, or constipation.  She had few episodes of diarrhea recently but she did not take any Imodium.  She denied having any recent weight loss or night sweats.  The patient is here today for evaluation and repeat blood work.  MEDICAL HISTORY: Past Medical History:  Diagnosis Date  . Anxiety   . Arthritis   . Asthma    exercise induced  . Depression    PMH  . GERD (gastroesophageal reflux disease)   . Glaucoma   . Hypertension   . Malignant pleural effusion    right  . PONV (postoperative  nausea and vomiting)   . Pre-diabetes   . Raynaud's disease   . Raynaud's disease     ALLERGIES:  is allergic to penicillins and vicodin [hydrocodone-acetaminophen].  MEDICATIONS:  Current Outpatient Medications  Medication Sig Dispense Refill  . acetaminophen (TYLENOL) 500 MG tablet Take 1,000 mg by mouth every 6 (six) hours as needed (pain).     Marland Kitchen ALPRAZolam (XANAX) 0.25 MG tablet Take 0.25 mg by mouth 2 (two) times daily as needed for anxiety.     Marland Kitchen amLODipine (NORVASC) 5 MG tablet Take 5 mg by mouth daily.  3  . ascorbic acid (VITAMIN C) 500 MG/5ML syrup Take 1,000 mg by mouth daily.    . Black Elderberry (SAMBUCUS ELDERBERRY) 50 MG/5ML SYRP Take 5 mLs by mouth daily.    . dorzolamide-timolol (COSOPT) 22.3-6.8 MG/ML ophthalmic solution Instill 1 drop in both eyes twice daily    . ezetimibe (ZETIA) 10 MG tablet Take 10 mg by mouth daily.     . hydrochlorothiazide (HYDRODIURIL) 25 MG tablet Take 25 mg by mouth daily.  4  . latanoprost (XALATAN) 0.005 % ophthalmic solution Place 1 drop into both eyes at bedtime.     . Melatonin 5 MG CAPS Take 10 mg by mouth at bedtime as needed (sleep).    . Multiple Vitamins-Minerals (MULTIVITAMIN ADULT PO) Take 1 tablet by mouth every other day.     . osimertinib mesylate (TAGRISSO) 80 MG tablet Take 1 tablet (80 mg total) by mouth daily. Refill Faxed to [facility] 03/26/18 30 tablet  3  . traMADol (ULTRAM) 50 MG tablet Take 50 mg by mouth 2 (two) times daily as needed. for pain  0  . VITAMIN D, CHOLECALCIFEROL, PO Take 5,000 Units by mouth daily.      No current facility-administered medications for this visit.     SURGICAL HISTORY:  Past Surgical History:  Procedure Laterality Date  . ABDOMINAL HYSTERECTOMY     partial  . CHEST TUBE INSERTION Right 01/01/2018   Procedure: INSERTION PLEURAL DRAINAGE CATHETER;  Surgeon: Ivin Poot, MD;  Location: [facility];  Service: Thoracic;  Laterality: Right;  . COLONOSCOPY    . DILATION AND CURETTAGE OF UTERUS     . EYE SURGERY     due to Glaucoma  . REMOVAL OF PLEURAL DRAINAGE CATHETER Right 11/07/2018   Procedure: REMOVAL OF PLEURAL DRAINAGE CATHETER;  Surgeon: Ivin Poot, MD;  Location: [facility];  Service: Thoracic;  Laterality: Right;  . ROTATOR CUFF REPAIR    . TUBAL LIGATION    . WISDOM TOOTH EXTRACTION      REVIEW OF SYSTEMS:  A comprehensive review of systems was negative except for: Gastrointestinal: positive for diarrhea   PHYSICAL EXAMINATION: General appearance: alert, cooperative and no distress Head: Normocephalic, without obvious abnormality, atraumatic Neck: no adenopathy, no JVD, supple, symmetrical, trachea midline and thyroid not enlarged, symmetric, no tenderness/mass/nodules Lymph nodes: Cervical, supraclavicular, and axillary nodes normal. Resp: clear to auscultation bilaterally Back: symmetric, no curvature. ROM normal. No CVA tenderness. Cardio: regular rate and rhythm, S1, S2 normal, no murmur, click, rub or gallop GI: soft, non-tender; bowel sounds normal; no masses,  no organomegaly Extremities: extremities normal, atraumatic, no cyanosis or edema  ECOG PERFORMANCE STATUS: 1 - Symptomatic but completely ambulatory  Blood pressure (!) 144/64, pulse (!) 55, temperature 98.5 F (36.9 C), temperature source Oral, resp. rate 20, height 5' 4" (1.626 m), weight 132 lb 4.8 oz (60 kg), SpO2 100 %.  LABORATORY DATA: Lab Results  Component Value Date   WBC 3.1 (L) 11/20/2018   HGB 11.9 (L) 11/20/2018   HCT 38.5 11/20/2018   MCV 82.8 11/20/2018   PLT 215 11/20/2018      Chemistry      Component Value Date/Time   NA 143 10/04/2018 1049   K 4.1 10/04/2018 1049   CL 104 10/04/2018 1049   CO2 27 10/04/2018 1049   BUN 12 10/04/2018 1049   CREATININE 1.02 (H) 10/04/2018 1049      Component Value Date/Time   CALCIUM 10.0 10/04/2018 1049   ALKPHOS 69 10/04/2018 1049   AST 26 10/04/2018 1049   ALT 24 10/04/2018 1049   BILITOT 0.3 10/04/2018 1049        RADIOGRAPHIC STUDIES: Dg Chest 2 View  Result Date: 10/23/2018 CLINICAL DATA:  Malignant pleural effusion EXAM: CHEST - 2 VIEW COMPARISON:  10/04/2018 chest CT FINDINGS: Small right pleural effusion is present associated with a pleural drainage catheter which is not significantly changed in position from 10/04/2018. Stable irregular right apical nodule, about 2.1 by 1.3 cm. Indistinct right basilar airspace opacity could be from atelectasis or pneumonia. The left lung appears clear. Cardiac and mediastinal margins appear normal. IMPRESSION: 1. Mild increase in right lower lobe airspace opacity possibly from atelectasis or pneumonia. 2. Stable small right pleural effusion with a right pleural drainage catheter in place. 3. Stable irregular right apical pulmonary nodule. Electronically Signed   By: Van Clines M.D.   On: 10/23/2018 16:15    ASSESSMENT AND PLAN: This  is a very pleasant 68 years old never smoker African-American female recently with a stage IV non-small cell lung cancer, adenocarcinoma with positive EGFR mutation with deletion in exon 19. The patient was started on treatment with Tagrisso 80 mg p.o. daily status post 10 months of treatment. The patient has been tolerating her treatment with no concerning adverse effects except for few episodes of diarrhea. I recommended for her to continue her current treatment with Tagrisso with the same dose. For the diarrhea I advised the patient use Imodium on as-needed basis.. I will see her back for follow-up visit in 6 weeks with repeat blood work. He was advised to call immediately if she has any concerning symptoms in the interval. The patient voices understanding of current disease status and treatment options and is in agreement with the current care plan. All questions were answered. The patient knows to call the clinic with any problems, questions or concerns. We can certainly see the patient much sooner if necessary.  Disclaimer:  This note was dictated with voice recognition software. Similar sounding words can inadvertently be transcribed and may not be corrected upon review.

## 2018-11-20 NOTE — Telephone Encounter (Signed)
Low potassium- Instructed pt to increase potassium in her diet. Examples of food listed and to go online for other examples.

## 2018-11-21 ENCOUNTER — Telehealth: Payer: Self-pay | Admitting: Internal Medicine

## 2018-11-21 ENCOUNTER — Other Ambulatory Visit: Payer: Self-pay

## 2018-11-21 NOTE — Telephone Encounter (Signed)
Scheduled appt per 6/3 los - pt is aware of appt date and time

## 2018-11-22 ENCOUNTER — Ambulatory Visit
Admission: RE | Admit: 2018-11-22 | Discharge: 2018-11-22 | Disposition: A | Discharge status: Home / Self Care | Payer: Medicare HMO | Source: Ambulatory Visit | Attending: Cardiothoracic Surgery | Admitting: Cardiothoracic Surgery

## 2018-11-22 ENCOUNTER — Encounter: Payer: Self-pay | Admitting: Cardiothoracic Surgery

## 2018-11-22 ENCOUNTER — Ambulatory Visit: Payer: Medicare HMO | Admitting: Cardiothoracic Surgery

## 2018-11-22 VITALS — BP 160/70 | HR 65 | Temp 97.7°F | Resp 20 | Ht 64.0 in | Wt 131.0 lb

## 2018-11-22 DIAGNOSIS — J90 Pleural effusion, not elsewhere classified: Secondary | ICD-10-CM

## 2018-11-22 DIAGNOSIS — R0602 Shortness of breath: Secondary | ICD-10-CM | POA: Diagnosis not present

## 2018-11-22 NOTE — Progress Notes (Signed)
PCP is [name], MD Referring Provider is [name], MD  Chief Complaint  Patient presents with  . Pleural Effusion    4 week f/u with CXR    HPI: Patient returns for scheduled visit after Pleurx catheter removal for malignant pleural effusion-adenocarcinoma being treated with Tagrisso.  Chest x-ray is clear.  Catheter site is healing and the sutures were removed.   Past Medical History:  Diagnosis Date  . Anxiety   . Arthritis   . Asthma    exercise induced  . Depression    PMH  . GERD (gastroesophageal reflux disease)   . Glaucoma   . Hypertension   . Malignant pleural effusion    right  . PONV (postoperative nausea and vomiting)   . Pre-diabetes   . Raynaud's disease   . Raynaud's disease     Past Surgical History:  Procedure Laterality Date  . ABDOMINAL HYSTERECTOMY     partial  . CHEST TUBE INSERTION Right 01/01/2018   Procedure: INSERTION PLEURAL DRAINAGE CATHETER;  Surgeon: Ivin Poot, MD;  Location: [facility];  Service: Thoracic;  Laterality: Right;  . COLONOSCOPY    . DILATION AND CURETTAGE OF UTERUS    . EYE SURGERY     due to Glaucoma  . REMOVAL OF PLEURAL DRAINAGE CATHETER Right 11/07/2018   Procedure: REMOVAL OF PLEURAL DRAINAGE CATHETER;  Surgeon: Ivin Poot, MD;  Location: [place];  Service: Thoracic;  Laterality: Right;  . ROTATOR CUFF REPAIR    . TUBAL LIGATION    . WISDOM TOOTH EXTRACTION      Family History  Problem Relation Age of Onset  . Heart disease Sister   . Heart disease Brother   . Lung cancer Other     Social History Social History   Tobacco Use  . Smoking status: Never Smoker  . Smokeless tobacco: Never Used  Substance Use Topics  . Alcohol use: Yes    Comment: up to 3 drinks per week  . Drug use: No    Comment: CBD oil     Current Outpatient Medications  Medication Sig Dispense Refill  . acetaminophen (TYLENOL) 500 MG tablet Take 1,000 mg by mouth every 6 (six) hours as needed (pain).     Marland Kitchen  ALPRAZolam (XANAX) 0.25 MG tablet Take 0.25 mg by mouth 2 (two) times daily as needed for anxiety.     Marland Kitchen amLODipine (NORVASC) 5 MG tablet Take 5 mg by mouth daily.  3  . ascorbic acid (VITAMIN C) 500 MG/5ML syrup Take 1,000 mg by mouth daily.    . Black Elderberry (SAMBUCUS ELDERBERRY) 50 MG/5ML SYRP Take 5 mLs by mouth daily.    . dorzolamide-timolol (COSOPT) 22.3-6.8 MG/ML ophthalmic solution Instill 1 drop in both eyes twice daily    . ezetimibe (ZETIA) 10 MG tablet Take 10 mg by mouth daily.     . hydrochlorothiazide (HYDRODIURIL) 25 MG tablet Take 25 mg by mouth daily.  4  . latanoprost (XALATAN) 0.005 % ophthalmic solution Place 1 drop into both eyes at bedtime.     . Melatonin 5 MG CAPS Take 10 mg by mouth at bedtime as needed (sleep).    . Multiple Vitamins-Minerals (MULTIVITAMIN ADULT PO) Take 1 tablet by mouth every other day.     . osimertinib mesylate (TAGRISSO) 80 MG tablet Take 1 tablet (80 mg total) by mouth daily. Refill Faxed to [facility] 03/26/18 30 tablet 3  . traMADol (ULTRAM) 50 MG tablet Take 50 mg by mouth  2 (two) times daily as needed. for pain  0  . VITAMIN D, CHOLECALCIFEROL, PO Take 5,000 Units by mouth daily.      No current facility-administered medications for this visit.     Allergies  Allergen Reactions  . Penicillins Other (See Comments)    SYNCOPE PATIENT HAS HAD A PCN REACTION WITH IMMEDIATE RASH, FACIAL/TONGUE/THROAT SWELLING, SOB, OR LIGHTHEADEDNESS WITH HYPOTENSION:  #  #  YES  #  # Has patient had a PCN reaction causing severe rash involving mucus membranes or skin necrosis: No Has patient had a PCN reaction that required hospitalization: No Has patient had a PCN reaction occurring within the last 10 years: No   . Vicodin [Hydrocodone-Acetaminophen] Other (See Comments)    Sped up heart and breathing    Review of Systems   Some soreness around the catheter site  BP (!) 160/70   Pulse 65   Temp 97.7 F (36.5 C) (Skin)   Resp 20   Ht 5\' 4"  (1.626  m)   Wt 131 lb (59.4 kg)   LMP  (LMP Unknown)   SpO2 98% Comment: RA  BMI 22.49 kg/m  Physical Exam      Exam    General- alert and comfortable    Neck- no JVD, no cervical adenopathy palpable, no carotid bruit   Lungs- clear without rales, wheezes   Cor- regular rate and rhythm, no murmur , gallop   Abdomen- soft, non-tender   Extremities - warm, non-tender, minimal edema   Neuro- oriented, appropriate, no focal weakness   Diagnostic Tests: Chest x-ray personally reviewed is clear without significant pleural effusion on either side  Impression: Successful removal of Pleurx catheter for malignant effusion which has resolved with chemotherapy  Plan: Return for follow-up chest x-ray in 1 month  Len Childs, MD [facility] [phone]

## 2018-11-26 ENCOUNTER — Telehealth: Payer: Self-pay | Admitting: Medical Oncology

## 2018-11-26 NOTE — Telephone Encounter (Signed)
Increase in loose/watery  BM since yesterday. The stool came out with urine. She took imodium 1 tablet after first stool then another tablet after second stool.  I instructed her to take 2 tablets after first watery /loose stool and then one tablet after successive stools up to 6 tabs /day. Encouraged her to drink Gatorade or similar drink, BRAT diet.

## 2018-11-26 NOTE — Telephone Encounter (Signed)
LVM -Encouraged potassium rich diet.

## 2018-12-05 DIAGNOSIS — H401131 Primary open-angle glaucoma, bilateral, mild stage: Secondary | ICD-10-CM | POA: Diagnosis not present

## 2018-12-05 DIAGNOSIS — H2513 Age-related nuclear cataract, bilateral: Secondary | ICD-10-CM | POA: Diagnosis not present

## 2018-12-13 ENCOUNTER — Other Ambulatory Visit: Payer: Self-pay | Admitting: Critical Care Medicine

## 2018-12-16 ENCOUNTER — Ambulatory Visit: Payer: Medicare HMO | Admitting: Nutrition

## 2018-12-16 NOTE — Progress Notes (Signed)
RD working remotely.  Nutrition follow up completed with patient. She is receiving Tagrisso. Reports she occasionally gets D from treatment. She is perplexed that she cannot gain weight. Current weight documented as 131 pounds which is within her usual range.  Educated on strategies for increasing fiber gradually after bouts of diarrhea. Encouraged low fiber diet when she is experiencing diarrhea. Will mail coupons at patient requests for samples. Patient has my contact information.

## 2018-12-17 LAB — NOVEL CORONAVIRUS, NAA: SARS-CoV-2, NAA: NOT DETECTED

## 2018-12-25 ENCOUNTER — Ambulatory Visit: Payer: Medicare HMO | Admitting: Cardiothoracic Surgery

## 2018-12-25 ENCOUNTER — Other Ambulatory Visit: Payer: Self-pay | Admitting: Cardiothoracic Surgery

## 2018-12-25 ENCOUNTER — Other Ambulatory Visit: Payer: Self-pay

## 2018-12-25 DIAGNOSIS — J90 Pleural effusion, not elsewhere classified: Secondary | ICD-10-CM

## 2018-12-26 ENCOUNTER — Ambulatory Visit: Payer: Medicare HMO | Admitting: Cardiothoracic Surgery

## 2018-12-26 ENCOUNTER — Encounter: Payer: Self-pay | Admitting: Cardiothoracic Surgery

## 2018-12-26 ENCOUNTER — Ambulatory Visit
Admission: RE | Admit: 2018-12-26 | Discharge: 2018-12-26 | Disposition: A | Discharge status: Home / Self Care | Payer: Medicare HMO | Source: Ambulatory Visit | Attending: Cardiothoracic Surgery | Admitting: Cardiothoracic Surgery

## 2018-12-26 VITALS — BP 145/75 | HR 57 | Temp 97.3°F | Resp 20 | Ht 64.0 in | Wt 130.0 lb

## 2018-12-26 DIAGNOSIS — Z9889 Other specified postprocedural states: Secondary | ICD-10-CM | POA: Diagnosis not present

## 2018-12-26 DIAGNOSIS — J90 Pleural effusion, not elsewhere classified: Secondary | ICD-10-CM

## 2018-12-26 DIAGNOSIS — C3401 Malignant neoplasm of right main bronchus: Secondary | ICD-10-CM | POA: Diagnosis not present

## 2018-12-26 NOTE — Progress Notes (Signed)
PCP is [name], MD Referring Provider is [name], MD  Chief Complaint  Patient presents with  . Pleural Effusion    3 week f/u with CXR    HPI: The patient returns approximately a month after removal of a right Pleurx catheter for malignant pleural effusion.  The effusion resolved on chemotherapy-oral Tagrisso under the direction of Dr. [name].  The patient denies symptoms of shortness of breath or cough.  Chest x-ray performed today personally reviewed and shows no evidence of recurrent pleural effusion.  No airspace disease other than a chronic spiculated density in the right apex which represents her treated primary   Past Medical History:  Diagnosis Date  . Anxiety   . Arthritis   . Asthma    exercise induced  . Depression    PMH  . GERD (gastroesophageal reflux disease)   . Glaucoma   . Hypertension   . Malignant pleural effusion    right  . PONV (postoperative nausea and vomiting)   . Pre-diabetes   . Raynaud's disease   . Raynaud's disease     Past Surgical History:  Procedure Laterality Date  . ABDOMINAL HYSTERECTOMY     partial  . CHEST TUBE INSERTION Right 01/01/2018   Procedure: INSERTION PLEURAL DRAINAGE CATHETER;  Surgeon: Ivin Poot, MD;  Location: [facility];  Service: Thoracic;  Laterality: Right;  . COLONOSCOPY    . DILATION AND CURETTAGE OF UTERUS    . EYE SURGERY     due to Glaucoma  . REMOVAL OF PLEURAL DRAINAGE CATHETER Right 11/07/2018   Procedure: REMOVAL OF PLEURAL DRAINAGE CATHETER;  Surgeon: Ivin Poot, MD;  Location: [facility];  Service: Thoracic;  Laterality: Right;  . ROTATOR CUFF REPAIR    . TUBAL LIGATION    . WISDOM TOOTH EXTRACTION      Family History  Problem Relation Age of Onset  . Heart disease Sister   . Heart disease Brother   . Lung cancer Other     Social History Social History   Tobacco Use  . Smoking status: Never Smoker  . Smokeless tobacco: Never Used  Substance Use Topics  . Alcohol  use: Yes    Comment: up to 3 drinks per week  . Drug use: No    Comment: CBD oil     Current Outpatient Medications  Medication Sig Dispense Refill  . acetaminophen (TYLENOL) 500 MG tablet Take 1,000 mg by mouth every 6 (six) hours as needed (pain).     Marland Kitchen ALPRAZolam (XANAX) 0.25 MG tablet Take 0.25 mg by mouth 2 (two) times daily as needed for anxiety.     Marland Kitchen amLODipine (NORVASC) 5 MG tablet Take 5 mg by mouth daily.  3  . ascorbic acid (VITAMIN C) 500 MG/5ML syrup Take 1,000 mg by mouth daily.    . Black Elderberry (SAMBUCUS ELDERBERRY) 50 MG/5ML SYRP Take 5 mLs by mouth daily.    . dorzolamide-timolol (COSOPT) 22.3-6.8 MG/ML ophthalmic solution Instill 1 drop in both eyes twice daily    . ezetimibe (ZETIA) 10 MG tablet Take 10 mg by mouth daily.     . hydrochlorothiazide (HYDRODIURIL) 25 MG tablet Take 25 mg by mouth daily.  4  . latanoprost (XALATAN) 0.005 % ophthalmic solution Place 1 drop into both eyes at bedtime.     . Melatonin 5 MG CAPS Take 10 mg by mouth at bedtime as needed (sleep).    . Multiple Vitamins-Minerals (MULTIVITAMIN ADULT PO) Take 1 tablet by mouth  every other day.     . osimertinib mesylate (TAGRISSO) 80 MG tablet Take 1 tablet (80 mg total) by mouth daily. Refill Faxed to [facility] 03/26/18 30 tablet 3  . traMADol (ULTRAM) 50 MG tablet Take 50 mg by mouth 2 (two) times daily as needed. for pain  0  . VITAMIN D, CHOLECALCIFEROL, PO Take 5,000 Units by mouth daily.      No current facility-administered medications for this visit.     Allergies  Allergen Reactions  . Penicillins Other (See Comments)    SYNCOPE PATIENT HAS HAD A PCN REACTION WITH IMMEDIATE RASH, FACIAL/TONGUE/THROAT SWELLING, SOB, OR LIGHTHEADEDNESS WITH HYPOTENSION:  #  #  YES  #  # Has patient had a PCN reaction causing severe rash involving mucus membranes or skin necrosis: No Has patient had a PCN reaction that required hospitalization: No Has patient had a PCN reaction occurring within the last  10 years: No   . Vicodin [Hydrocodone-Acetaminophen] Other (See Comments)    Sped up heart and breathing    Review of Systems  Weight stable No cough No chest pain No fever  BP (!) 145/75   Pulse (!) 57   Temp (!) 97.3 F (36.3 C) (Skin)   Resp 20   Ht 5\' 4"  (1.626 m)   Wt 130 lb (59 kg)   LMP  (LMP Unknown)   SpO2 98% Comment: RA  BMI 22.31 kg/m  Physical Exam Alert and comfortable Breath sounds equal and clear Heart rate regular Right Pleurx catheter site well-healed  Diagnostic Tests: Chest x-ray personally reviewed showing no evidence recurrent pleural effusion  Impression: Malignant pleural effusion has resolved. The patient wishes to have surveillance x-rays to make sure the fluid does not come back so she will return in 3 months with a chest x-ray.  She will let us know if she develops symptoms.  Plan: Chest x-ray and an office visit in 3 months.   Len Childs, MD [facility] [phone]

## 2019-01-09 ENCOUNTER — Inpatient Hospital Stay: Payer: Medicare HMO

## 2019-01-09 ENCOUNTER — Other Ambulatory Visit: Payer: Self-pay

## 2019-01-09 ENCOUNTER — Telehealth: Payer: Self-pay | Admitting: Internal Medicine

## 2019-01-09 ENCOUNTER — Encounter: Payer: Self-pay | Admitting: Internal Medicine

## 2019-01-09 ENCOUNTER — Inpatient Hospital Stay: Payer: Medicare HMO | Attending: Internal Medicine | Admitting: Internal Medicine

## 2019-01-09 VITALS — BP 149/71 | HR 65 | Temp 98.7°F | Resp 17 | Ht 64.0 in | Wt 133.1 lb

## 2019-01-09 DIAGNOSIS — C3411 Malignant neoplasm of upper lobe, right bronchus or lung: Secondary | ICD-10-CM

## 2019-01-09 DIAGNOSIS — Z5111 Encounter for antineoplastic chemotherapy: Secondary | ICD-10-CM

## 2019-01-09 DIAGNOSIS — Z1231 Encounter for screening mammogram for malignant neoplasm of breast: Secondary | ICD-10-CM | POA: Diagnosis not present

## 2019-01-09 DIAGNOSIS — J910 Malignant pleural effusion: Secondary | ICD-10-CM | POA: Diagnosis not present

## 2019-01-09 DIAGNOSIS — J90 Pleural effusion, not elsewhere classified: Secondary | ICD-10-CM

## 2019-01-09 DIAGNOSIS — C3491 Malignant neoplasm of unspecified part of right bronchus or lung: Secondary | ICD-10-CM

## 2019-01-09 DIAGNOSIS — R197 Diarrhea, unspecified: Secondary | ICD-10-CM | POA: Diagnosis not present

## 2019-01-09 DIAGNOSIS — C3490 Malignant neoplasm of unspecified part of unspecified bronchus or lung: Secondary | ICD-10-CM

## 2019-01-09 LAB — CMP (CANCER CENTER ONLY)
ALT: 49 U/L — ABNORMAL HIGH (ref 0–44)
AST: 24 U/L (ref 15–41)
Albumin: 3.7 g/dL (ref 3.5–5.0)
Alkaline Phosphatase: 58 U/L (ref 38–126)
Anion gap: 11 (ref 5–15)
BUN: 13 mg/dL (ref 8–23)
CO2: 25 mmol/L (ref 22–32)
Calcium: 9.4 mg/dL (ref 8.9–10.3)
Chloride: 104 mmol/L (ref 98–111)
Creatinine: 1.1 mg/dL — ABNORMAL HIGH (ref 0.44–1.00)
GFR, Est AFR Am: >60 mL/min (ref >60)
GFR, Estimated: 52 mL/min — ABNORMAL LOW (ref >60)
Glucose, Bld: 84 mg/dL (ref 70–99)
Potassium: 3.4 mmol/L — ABNORMAL LOW (ref 3.5–5.1)
Sodium: 140 mmol/L (ref 135–145)
Total Bilirubin: 0.4 mg/dL (ref 0.3–1.2)
Total Protein: 7.5 g/dL (ref 6.5–8.1)

## 2019-01-09 LAB — CBC WITH DIFFERENTIAL (CANCER CENTER ONLY)
Abs Immature Granulocytes: 0.01 10*3/uL (ref 0.00–0.07)
Basophils Absolute: 0 10*3/uL (ref 0.0–0.1)
Basophils Relative: 1 %
Eosinophils Absolute: 0.1 10*3/uL (ref 0.0–0.5)
Eosinophils Relative: 2 %
HCT: 40.3 % (ref 36.0–46.0)
Hemoglobin: 12.7 g/dL (ref 12.0–15.0)
Immature Granulocytes: 0 %
Lymphocytes Relative: 41 %
Lymphs Abs: 1.6 10*3/uL (ref 0.7–4.0)
MCH: 26.4 pg (ref 26.0–34.0)
MCHC: 31.5 g/dL (ref 30.0–36.0)
MCV: 83.8 fL (ref 80.0–100.0)
Monocytes Absolute: 0.6 10*3/uL (ref 0.1–1.0)
Monocytes Relative: 14 %
Neutro Abs: 1.7 10*3/uL (ref 1.7–7.7)
Neutrophils Relative %: 42 %
Platelet Count: 234 10*3/uL (ref 150–400)
RBC: 4.81 MIL/uL (ref 3.87–5.11)
RDW: 15.4 % (ref 11.5–15.5)
WBC Count: 4 10*3/uL (ref 4.0–10.5)
nRBC: 0 % (ref 0.0–0.2)

## 2019-01-09 NOTE — Progress Notes (Signed)
[place] Telephone:([phone]) [phone]   [phone]  OFFICE PROGRESS NOTE  [name], [place] [address]  DIAGNOSIS: Stage IV (T2 a,N2, M1a) non-small cell lung cancer, adenocarcinoma diagnosed in July 2019 and presented with right upper lobe lung mass in addition to mediastinal lymphadenopathy as well as bilateral pulmonary nodules and malignant right pleural effusion.  Biomarker Findings Microsatellite status - Cannot Be Determined Tumor Mutational Burden - Cannot Be Determined Genomic Findings For a complete list of the genes assayed, please refer to the Appendix. EGFR exon 19 deletion ([id number]>D) TP53 Y220C 7 Disease relevant genes with no reportable alterations: KRAS, ALK, BRAF, MET, RET, ERBB2, ROS1   PRIOR THERAPY: Status post right Pleurx catheter placement by Dr. Prescott Gum for drainage of malignant right pleural effusion.  CURRENT THERAPY: Tagrisso 80 mg p.o. daily.  First dose was given on 01/29/2018.  Status post 11 months of treatment.  INTERVAL HISTORY: Allison [name] 68 y.o. female returns to the clinic today for follow-up visit.  The patient is feeling fine today with no concerning complaints.  She had the Pleurx catheter removed in June 2020 by Dr. Prescott Gum.  She is very anxious about the possibility of reaccumulation of the pleural fluid.  She denied having any current chest pain, shortness of breath, cough or hemoptysis.  She denied having any fever or chills.  She has no nausea, vomiting, diarrhea or constipation.  She denied having any headache or visual changes.  She is here today for evaluation and repeat blood work.  MEDICAL HISTORY: Past Medical History:  Diagnosis Date  . Anxiety   . Arthritis   . Asthma    exercise induced  . Depression    PMH  . GERD (gastroesophageal reflux disease)   . Glaucoma   . Hypertension   . Malignant pleural effusion    right  . PONV (postoperative  nausea and vomiting)   . Pre-diabetes   . Raynaud's disease   . Raynaud's disease     ALLERGIES:  is allergic to penicillins and vicodin [hydrocodone-acetaminophen].  MEDICATIONS:  Current Outpatient Medications  Medication Sig Dispense Refill  . acetaminophen (TYLENOL) 500 MG tablet Take 1,000 mg by mouth every 6 (six) hours as needed (pain).     Marland Kitchen ALPRAZolam (XANAX) 0.25 MG tablet Take 0.25 mg by mouth 2 (two) times daily as needed for anxiety.     Marland Kitchen amLODipine (NORVASC) 5 MG tablet Take 5 mg by mouth daily.  3  . ascorbic acid (VITAMIN C) 500 MG/5ML syrup Take 1,000 mg by mouth daily.    . Black Elderberry (SAMBUCUS ELDERBERRY) 50 MG/5ML SYRP Take 5 mLs by mouth daily.    . dorzolamide-timolol (COSOPT) 22.3-6.8 MG/ML ophthalmic solution Instill 1 drop in both eyes twice daily    . ezetimibe (ZETIA) 10 MG tablet Take 10 mg by mouth daily.     . hydrochlorothiazide (HYDRODIURIL) 25 MG tablet Take 25 mg by mouth daily.  4  . latanoprost (XALATAN) 0.005 % ophthalmic solution Place 1 drop into both eyes at bedtime.     . Melatonin 5 MG CAPS Take 10 mg by mouth at bedtime as needed (sleep).    . Multiple Vitamins-Minerals (MULTIVITAMIN ADULT PO) Take 1 tablet by mouth every other day.     . osimertinib mesylate (TAGRISSO) 80 MG tablet Take 1 tablet (80 mg total) by mouth daily. Refill Faxed to [facility] 03/26/18 30 tablet 3  .  traMADol (ULTRAM) 50 MG tablet Take 50 mg by mouth 2 (two) times daily as needed. for pain  0  . VITAMIN D, CHOLECALCIFEROL, PO Take 5,000 Units by mouth daily.      No current facility-administered medications for this visit.     SURGICAL HISTORY:  Past Surgical History:  Procedure Laterality Date  . ABDOMINAL HYSTERECTOMY     partial  . CHEST TUBE INSERTION Right 01/01/2018   Procedure: INSERTION PLEURAL DRAINAGE CATHETER;  Surgeon: Ivin Poot, MD;  Location: [facility];  Service: Thoracic;  Laterality: Right;  . COLONOSCOPY    . DILATION AND CURETTAGE OF UTERUS     . EYE SURGERY     due to Glaucoma  . REMOVAL OF PLEURAL DRAINAGE CATHETER Right 11/07/2018   Procedure: REMOVAL OF PLEURAL DRAINAGE CATHETER;  Surgeon: Ivin Poot, MD;  Location: [facility];  Service: Thoracic;  Laterality: Right;  . ROTATOR CUFF REPAIR    . TUBAL LIGATION    . WISDOM TOOTH EXTRACTION      REVIEW OF SYSTEMS:  A comprehensive review of systems was negative.   PHYSICAL EXAMINATION: General appearance: alert, cooperative and no distress Head: Normocephalic, without obvious abnormality, atraumatic Neck: no adenopathy, no JVD, supple, symmetrical, trachea midline and thyroid not enlarged, symmetric, no tenderness/mass/nodules Lymph nodes: Cervical, supraclavicular, and axillary nodes normal. Resp: clear to auscultation bilaterally Back: symmetric, no curvature. ROM normal. No CVA tenderness. Cardio: regular rate and rhythm, S1, S2 normal, no murmur, click, rub or gallop GI: soft, non-tender; bowel sounds normal; no masses,  no organomegaly Extremities: extremities normal, atraumatic, no cyanosis or edema  ECOG PERFORMANCE STATUS: 0 - Asymptomatic  Blood pressure (!) 149/71, pulse 65, temperature 98.7 F (37.1 C), temperature source Temporal, resp. rate 17, height _0  (1.626 m), weight 133 lb 1.6 oz (60.4 kg), SpO2 100 %.  LABORATORY DATA: Lab Results  Component Value Date   WBC 4.0 01/09/2019   HGB 12.7 01/09/2019   HCT 40.3 01/09/2019   MCV 83.8 01/09/2019   PLT 234 01/09/2019      Chemistry      Component Value Date/Time   NA 143 11/20/2018 0829   K 3.2 (L) 11/20/2018 0829   CL 107 11/20/2018 0829   CO2 27 11/20/2018 0829   BUN 11 11/20/2018 0829   CREATININE 0.95 11/20/2018 0829      Component Value Date/Time   CALCIUM 9.1 11/20/2018 0829   ALKPHOS 57 11/20/2018 0829   AST 25 11/20/2018 0829   ALT 24 11/20/2018 0829   BILITOT 0.4 11/20/2018 0829       RADIOGRAPHIC STUDIES: Dg Chest 2 View  Result Date: 12/26/2018 CLINICAL DATA:   68 year old female with right pleural effusion. Right lung cancer. EXAM: CHEST - 2 VIEW COMPARISON:  Chest radiographs 11/22/2018 and earlier. FINDINGS: Spiculated opacity in the right apex appears radiographically stable. Stable evidence of a small or trace right pleural effusion. Mediastinal contours remain normal. Stable lung volumes. Visualized tracheal air column is within normal limits. No new pulmonary opacity. Stable visualized osseous structures. Negative visible bowel gas pattern. IMPRESSION: Stable since June: Spiculated right apical density and small or trace right pleural effusion. Electronically Signed   By: Genevie Ann M.D.   On: 12/26/2018 10:31    ASSESSMENT AND PLAN: This is a very pleasant 68 years old never smoker African-American female recently with a stage IV non-small cell lung cancer, adenocarcinoma with positive EGFR mutation with deletion in exon 19. The patient was started  on treatment with Tagrisso 80 mg p.o. daily status post 11 months of treatment. The patient continues to tolerate this treatment well with no concerning complaints except for intermittent diarrhea. I recommended for her to continue her current treatment with Tagrisso with the same dose. I will see her back for follow-up visit in 1 months for evaluation with repeat CT scan of the chest, abdomen and pelvis for restaging of her disease. She was advised to call immediately if she has any concerning symptoms in the interval. The patient voices understanding of current disease status and treatment options and is in agreement with the current care plan. All questions were answered. The patient knows to call the clinic with any problems, questions or concerns. We can certainly see the patient much sooner if necessary.  Disclaimer: This note was dictated with voice recognition software. Similar sounding words can inadvertently be transcribed and may not be corrected upon review.

## 2019-01-09 NOTE — Telephone Encounter (Signed)
Scheduled appt per 7/23 los - reminder letter mailed with appt date and time

## 2019-01-15 ENCOUNTER — Other Ambulatory Visit: Payer: Self-pay | Admitting: Medical Oncology

## 2019-01-15 DIAGNOSIS — C3491 Malignant neoplasm of unspecified part of right bronchus or lung: Secondary | ICD-10-CM

## 2019-01-15 MED ORDER — OSIMERTINIB MESYLATE 80 MG PO TABS: 80.0000 mg | ORAL_TABLET | Freq: Every day | ORAL | 3 refills | Status: DC

## 2019-01-29 ENCOUNTER — Other Ambulatory Visit: Payer: Self-pay

## 2019-01-29 DIAGNOSIS — Z20822 Contact with and (suspected) exposure to covid-19: Secondary | ICD-10-CM

## 2019-01-30 LAB — NOVEL CORONAVIRUS, NAA: SARS-CoV-2, NAA: NOT DETECTED

## 2019-02-06 ENCOUNTER — Inpatient Hospital Stay: Payer: Medicare HMO | Attending: Internal Medicine

## 2019-02-06 ENCOUNTER — Ambulatory Visit (HOSPITAL_COMMUNITY)
Admission: RE | Admit: 2019-02-06 | Discharge: 2019-02-06 | Disposition: A | Discharge status: Home / Self Care | Payer: Medicare HMO | Source: Ambulatory Visit | Attending: Internal Medicine | Admitting: Internal Medicine

## 2019-02-06 ENCOUNTER — Other Ambulatory Visit: Payer: Self-pay

## 2019-02-06 ENCOUNTER — Ambulatory Visit (HOSPITAL_COMMUNITY): Admission: RE | Admit: 2019-02-06 | Payer: Medicare HMO | Source: Ambulatory Visit

## 2019-02-06 DIAGNOSIS — K219 Gastro-esophageal reflux disease without esophagitis: Secondary | ICD-10-CM | POA: Insufficient documentation

## 2019-02-06 DIAGNOSIS — J910 Malignant pleural effusion: Secondary | ICD-10-CM | POA: Insufficient documentation

## 2019-02-06 DIAGNOSIS — J45909 Unspecified asthma, uncomplicated: Secondary | ICD-10-CM | POA: Insufficient documentation

## 2019-02-06 DIAGNOSIS — C3490 Malignant neoplasm of unspecified part of unspecified bronchus or lung: Secondary | ICD-10-CM | POA: Insufficient documentation

## 2019-02-06 DIAGNOSIS — C3411 Malignant neoplasm of upper lobe, right bronchus or lung: Secondary | ICD-10-CM | POA: Insufficient documentation

## 2019-02-06 DIAGNOSIS — R7303 Prediabetes: Secondary | ICD-10-CM | POA: Insufficient documentation

## 2019-02-06 DIAGNOSIS — R195 Other fecal abnormalities: Secondary | ICD-10-CM | POA: Diagnosis not present

## 2019-02-06 DIAGNOSIS — R69 Illness, unspecified: Secondary | ICD-10-CM | POA: Diagnosis not present

## 2019-02-06 DIAGNOSIS — C771 Secondary and unspecified malignant neoplasm of intrathoracic lymph nodes: Secondary | ICD-10-CM | POA: Insufficient documentation

## 2019-02-06 DIAGNOSIS — Z5111 Encounter for antineoplastic chemotherapy: Secondary | ICD-10-CM | POA: Diagnosis not present

## 2019-02-06 DIAGNOSIS — I10 Essential (primary) hypertension: Secondary | ICD-10-CM | POA: Diagnosis not present

## 2019-02-06 DIAGNOSIS — G4700 Insomnia, unspecified: Secondary | ICD-10-CM | POA: Diagnosis not present

## 2019-02-06 DIAGNOSIS — Z79899 Other long term (current) drug therapy: Secondary | ICD-10-CM | POA: Insufficient documentation

## 2019-02-06 DIAGNOSIS — F419 Anxiety disorder, unspecified: Secondary | ICD-10-CM | POA: Diagnosis not present

## 2019-02-06 DIAGNOSIS — F329 Major depressive disorder, single episode, unspecified: Secondary | ICD-10-CM | POA: Insufficient documentation

## 2019-02-06 DIAGNOSIS — I7300 Raynaud's syndrome without gangrene: Secondary | ICD-10-CM | POA: Insufficient documentation

## 2019-02-06 LAB — CMP (CANCER CENTER ONLY)
ALT: 22 U/L (ref 0–44)
AST: 26 U/L (ref 15–41)
Albumin: 3.9 g/dL (ref 3.5–5.0)
Alkaline Phosphatase: 59 U/L (ref 38–126)
Anion gap: 10 (ref 5–15)
BUN: 18 mg/dL (ref 8–23)
CO2: 25 mmol/L (ref 22–32)
Calcium: 9.6 mg/dL (ref 8.9–10.3)
Chloride: 105 mmol/L (ref 98–111)
Creatinine: 0.98 mg/dL (ref 0.44–1.00)
GFR, Est AFR Am: >60 mL/min (ref >60)
GFR, Estimated: 59 mL/min — ABNORMAL LOW (ref >60)
Glucose, Bld: 74 mg/dL (ref 70–99)
Potassium: 4.1 mmol/L (ref 3.5–5.1)
Sodium: 140 mmol/L (ref 135–145)
Total Bilirubin: 0.3 mg/dL (ref 0.3–1.2)
Total Protein: 7.6 g/dL (ref 6.5–8.1)

## 2019-02-06 LAB — CBC WITH DIFFERENTIAL (CANCER CENTER ONLY)
Abs Immature Granulocytes: 0 10*3/uL (ref 0.00–0.07)
Basophils Absolute: 0 10*3/uL (ref 0.0–0.1)
Basophils Relative: 1 %
Eosinophils Absolute: 0 10*3/uL (ref 0.0–0.5)
Eosinophils Relative: 1 %
HCT: 39 % (ref 36.0–46.0)
Hemoglobin: 12.2 g/dL (ref 12.0–15.0)
Immature Granulocytes: 0 %
Lymphocytes Relative: 50 %
Lymphs Abs: 1.7 10*3/uL (ref 0.7–4.0)
MCH: 26.1 pg (ref 26.0–34.0)
MCHC: 31.3 g/dL (ref 30.0–36.0)
MCV: 83.3 fL (ref 80.0–100.0)
Monocytes Absolute: 0.4 10*3/uL (ref 0.1–1.0)
Monocytes Relative: 11 %
Neutro Abs: 1.3 10*3/uL — ABNORMAL LOW (ref 1.7–7.7)
Neutrophils Relative %: 37 %
Platelet Count: 226 10*3/uL (ref 150–400)
RBC: 4.68 MIL/uL (ref 3.87–5.11)
RDW: 15.1 % (ref 11.5–15.5)
WBC Count: 3.4 10*3/uL — ABNORMAL LOW (ref 4.0–10.5)
nRBC: 0 % (ref 0.0–0.2)

## 2019-02-06 MED ORDER — SODIUM CHLORIDE (PF) 0.9 % IJ SOLN
INTRAMUSCULAR | Status: AC
  Filled 2019-02-06: qty 50

## 2019-02-06 MED ORDER — IOHEXOL 300 MG/ML  SOLN
100.0000 mL | Freq: Once | INTRAMUSCULAR | Status: AC | PRN
  Administered 2019-02-06: 100 mL via INTRAVENOUS

## 2019-02-07 ENCOUNTER — Other Ambulatory Visit: Payer: Medicare HMO

## 2019-02-07 ENCOUNTER — Ambulatory Visit (HOSPITAL_COMMUNITY): Payer: Medicare HMO

## 2019-02-10 ENCOUNTER — Inpatient Hospital Stay: Payer: Medicare HMO | Admitting: Internal Medicine

## 2019-02-10 ENCOUNTER — Encounter: Payer: Self-pay | Admitting: Internal Medicine

## 2019-02-10 ENCOUNTER — Telehealth: Payer: Self-pay | Admitting: Internal Medicine

## 2019-02-10 ENCOUNTER — Other Ambulatory Visit: Payer: Self-pay

## 2019-02-10 VITALS — BP 148/75 | HR 69 | Temp 98.7°F | Resp 20 | Ht 64.0 in | Wt 133.3 lb

## 2019-02-10 DIAGNOSIS — J910 Malignant pleural effusion: Secondary | ICD-10-CM | POA: Diagnosis not present

## 2019-02-10 DIAGNOSIS — C771 Secondary and unspecified malignant neoplasm of intrathoracic lymph nodes: Secondary | ICD-10-CM | POA: Diagnosis not present

## 2019-02-10 DIAGNOSIS — Z79899 Other long term (current) drug therapy: Secondary | ICD-10-CM | POA: Diagnosis not present

## 2019-02-10 DIAGNOSIS — G4700 Insomnia, unspecified: Secondary | ICD-10-CM | POA: Diagnosis not present

## 2019-02-10 DIAGNOSIS — Z5111 Encounter for antineoplastic chemotherapy: Secondary | ICD-10-CM | POA: Diagnosis not present

## 2019-02-10 DIAGNOSIS — J45909 Unspecified asthma, uncomplicated: Secondary | ICD-10-CM | POA: Diagnosis not present

## 2019-02-10 DIAGNOSIS — R69 Illness, unspecified: Secondary | ICD-10-CM | POA: Diagnosis not present

## 2019-02-10 DIAGNOSIS — K219 Gastro-esophageal reflux disease without esophagitis: Secondary | ICD-10-CM | POA: Diagnosis not present

## 2019-02-10 DIAGNOSIS — C3491 Malignant neoplasm of unspecified part of right bronchus or lung: Secondary | ICD-10-CM | POA: Diagnosis not present

## 2019-02-10 DIAGNOSIS — I10 Essential (primary) hypertension: Secondary | ICD-10-CM | POA: Diagnosis not present

## 2019-02-10 DIAGNOSIS — C3411 Malignant neoplasm of upper lobe, right bronchus or lung: Secondary | ICD-10-CM | POA: Diagnosis not present

## 2019-02-10 NOTE — Telephone Encounter (Signed)
Scheduled appt per 8/24 los. ° °Patient declined calendar and avs. °

## 2019-02-10 NOTE — Progress Notes (Signed)
[place] Telephone:(336) [phone]   [phone]  OFFICE PROGRESS NOTE  [name], [place] [address]  DIAGNOSIS: Stage IV (T2 a,N2, M1a) non-small cell lung cancer, adenocarcinoma diagnosed in July 2019 and presented with right upper lobe lung mass in addition to mediastinal lymphadenopathy as well as bilateral pulmonary nodules and malignant right pleural effusion.  Biomarker Findings Microsatellite status - Cannot Be Determined Tumor Mutational Burden - Cannot Be Determined Genomic Findings For a complete list of the genes assayed, please refer to the Appendix. EGFR exon 19 deletion ([id number]) TP53 Y220C 7 Disease relevant genes with no reportable alterations: KRAS, ALK, BRAF, MET, RET, ERBB2, ROS1   PRIOR THERAPY: Status post right Pleurx catheter placement by Dr. Prescott Gum for drainage of malignant right pleural effusion.  CURRENT THERAPY: Tagrisso 80 mg p.o. daily.  First dose was given on 01/29/2018.  Status post 12 months of treatment.  INTERVAL HISTORY: Allison [name] 68 y.o. female returns to the clinic today for follow-up visit.  The patient is feeling fine today with no concerning complaints except for insomnia and some nail changes.  She denied having any significant skin rash or diarrhea.  She has no chest pain, shortness of breath, cough or hemoptysis.  She denied having any fever or chills.  She has no nausea, vomiting or constipation.  She has no significant weight loss or night sweats.  She has been tolerating her treatment with Tagrisso fairly well.  She had repeat CT scan of the chest, abdomen pelvis performed recently and she is here for evaluation and discussion of her scan results.  MEDICAL HISTORY: Past Medical History:  Diagnosis Date   Anxiety    Arthritis    Asthma    exercise induced   Depression    PMH   GERD (gastroesophageal reflux disease)    Glaucoma    Hypertension     Malignant pleural effusion    right   PONV (postoperative nausea and vomiting)    Pre-diabetes    Raynaud's disease    Raynaud's disease     ALLERGIES:  is allergic to penicillins and vicodin [hydrocodone-acetaminophen].  MEDICATIONS:  Current Outpatient Medications  Medication Sig Dispense Refill   acetaminophen (TYLENOL) 500 MG tablet Take 1,000 mg by mouth every 6 (six) hours as needed (pain).      ALPRAZolam (XANAX) 0.25 MG tablet Take 0.25 mg by mouth 2 (two) times daily as needed for anxiety.      amLODipine (NORVASC) 5 MG tablet Take 5 mg by mouth daily.  3   ascorbic acid (VITAMIN C) 500 MG/5ML syrup Take 1,000 mg by mouth daily.     Black Elderberry (SAMBUCUS ELDERBERRY) 50 MG/5ML SYRP Take 5 mLs by mouth daily.     dorzolamide-timolol (COSOPT) 22.3-6.8 MG/ML ophthalmic solution Instill 1 drop in both eyes twice daily     ezetimibe (ZETIA) 10 MG tablet Take 10 mg by mouth daily.      hydrochlorothiazide (HYDRODIURIL) 25 MG tablet Take 25 mg by mouth daily.  4   latanoprost (XALATAN) 0.005 % ophthalmic solution Place 1 drop into both eyes at bedtime.      Melatonin 5 MG CAPS Take 10 mg by mouth at bedtime as needed (sleep).     Multiple Vitamins-Minerals (MULTIVITAMIN ADULT PO) Take 1 tablet by mouth every other day.      osimertinib mesylate (TAGRISSO) 80 MG tablet Take 1 tablet (80 mg total) by  mouth daily. Refill Faxed to [facility] 03/26/18 30 tablet 3   traMADol (ULTRAM) 50 MG tablet Take 50 mg by mouth 2 (two) times daily as needed. for pain  0   VITAMIN D, CHOLECALCIFEROL, PO Take 5,000 Units by mouth daily.      No current facility-administered medications for this visit.     SURGICAL HISTORY:  Past Surgical History:  Procedure Laterality Date   ABDOMINAL HYSTERECTOMY     partial   CHEST TUBE INSERTION Right 01/01/2018   Procedure: INSERTION PLEURAL DRAINAGE CATHETER;  Surgeon: Ivin Poot, MD;  Location: [facility];  Service: Thoracic;   Laterality: Right;   COLONOSCOPY     DILATION AND CURETTAGE OF UTERUS     EYE SURGERY     due to Glaucoma   REMOVAL OF PLEURAL DRAINAGE CATHETER Right 11/07/2018   Procedure: REMOVAL OF PLEURAL DRAINAGE CATHETER;  Surgeon: Ivin Poot, MD;  Location: [facility];  Service: Thoracic;  Laterality: Right;   ROTATOR CUFF REPAIR     TUBAL LIGATION     WISDOM TOOTH EXTRACTION      REVIEW OF SYSTEMS:  Constitutional: negative Eyes: negative Ears, nose, mouth, throat, and face: negative Respiratory: negative Cardiovascular: negative Gastrointestinal: negative Genitourinary:negative Integument/breast: positive for dryness Hematologic/lymphatic: negative Musculoskeletal:negative Neurological: negative Behavioral/Psych: negative Endocrine: negative Allergic/Immunologic: negative   PHYSICAL EXAMINATION: General appearance: alert, cooperative and no distress Head: Normocephalic, without obvious abnormality, atraumatic Neck: no adenopathy, no JVD, supple, symmetrical, trachea midline and thyroid not enlarged, symmetric, no tenderness/mass/nodules Lymph nodes: Cervical, supraclavicular, and axillary nodes normal. Resp: clear to auscultation bilaterally Back: symmetric, no curvature. ROM normal. No CVA tenderness. Cardio: regular rate and rhythm, S1, S2 normal, no murmur, click, rub or gallop GI: soft, non-tender; bowel sounds normal; no masses,  no organomegaly Extremities: extremities normal, atraumatic, no cyanosis or edema Neurologic: Alert and oriented X 3, normal strength and tone. Normal symmetric reflexes. Normal coordination and gait  ECOG PERFORMANCE STATUS: 0 - Asymptomatic  Blood pressure (!) 148/75, pulse 69, temperature 98.7 F (37.1 C), temperature source Oral, resp. rate 20, height '5\' 4"'  (1.626 m), weight 133 lb 4.8 oz (60.5 kg), SpO2 100 %.  LABORATORY DATA: Lab Results  Component Value Date   WBC 3.4 (L) 02/06/2019   HGB 12.2 02/06/2019   HCT 39.0 02/06/2019    MCV 83.3 02/06/2019   PLT 226 02/06/2019      Chemistry      Component Value Date/Time   NA 140 02/06/2019 0915   K 4.1 02/06/2019 0915   CL 105 02/06/2019 0915   CO2 25 02/06/2019 0915   BUN 18 02/06/2019 0915   CREATININE 0.98 02/06/2019 0915      Component Value Date/Time   CALCIUM 9.6 02/06/2019 0915   ALKPHOS 59 02/06/2019 0915   AST 26 02/06/2019 0915   ALT 22 02/06/2019 0915   BILITOT 0.3 02/06/2019 0915       RADIOGRAPHIC STUDIES: Ct Chest W Contrast  Result Date: 02/06/2019 CLINICAL DATA:  Stage IV right upper lobe lung adenocarcinoma diagnosed July 2019 with ongoing targeted chemotherapy. Restaging. EXAM: CT CHEST, ABDOMEN, AND PELVIS WITH CONTRAST TECHNIQUE: Multidetector CT imaging of the chest, abdomen and pelvis was performed following the standard protocol during bolus administration of intravenous contrast. CONTRAST:  172m OMNIPAQUE IOHEXOL 300 MG/ML  SOLN COMPARISON:  10/04/2018 CT chest, abdomen and pelvis. FINDINGS: CT CHEST FINDINGS Cardiovascular: Normal heart size. No significant pericardial effusion/thickening. Atherosclerotic nonaneurysmal thoracic aorta. Normal caliber pulmonary arteries. No central  pulmonary emboli. Mediastinum/Nodes: Stable subcentimeter hypodense bilateral thyroid nodules. Unremarkable esophagus. No pathologically enlarged axillary, mediastinal or hilar lymph nodes. Lungs/Pleura: No pneumothorax. Interval removal of right PleurX catheter. Trace dependent right pleural effusion is unchanged. No left pleural effusion. Apical right upper lobe irregular solid 2.6 x 1.7 cm pulmonary nodule (series 4/image 36), previously 2.6 x 1.8 cm, stable. Anterior right lower lobe 0.8 cm irregular solid pulmonary nodule along the major fissure (series 4/image 94), previously 0.8 cm, stable. No acute consolidative airspace disease or new significant pulmonary nodules. Stable tiny calcified peripheral left lower lobe granuloma. Musculoskeletal: No aggressive  appearing focal osseous lesions. Mild thoracic spondylosis. CT ABDOMEN PELVIS FINDINGS Hepatobiliary: Normal liver with no liver mass. Normal gallbladder with no radiopaque cholelithiasis. No biliary ductal dilatation. Pancreas: Normal, with no mass or duct dilation. Spleen: Normal size. No mass. Adrenals/Urinary Tract: Normal adrenals. A few scattered subcentimeter hypodense renal cortical lesions in both kidneys are too small to characterize and are unchanged. No new renal lesions. No hydronephrosis. Normal bladder. Stomach/Bowel: Normal non-distended stomach. Normal caliber small bowel with no small bowel wall thickening. Oral contrast transits to the colon. Appendix not discretely visualized. Moderate diffuse colonic stool. No large bowel wall thickening, significant diverticulosis or acute pericolonic fat stranding. Vascular/Lymphatic: Atherosclerotic nonaneurysmal abdominal aorta. Patent portal, splenic, hepatic and renal veins. No pathologically enlarged lymph nodes in the abdomen or pelvis. Reproductive: Status post hysterectomy, with no abnormal findings at the vaginal cuff. No adnexal mass. Other: No pneumoperitoneum, ascites or focal fluid collection. Musculoskeletal: No aggressive appearing focal osseous lesions. Moderate lower lumbar spondylosis. IMPRESSION: 1. Apical right upper lobe pulmonary nodule is stable. Anterior right lower lobe pulmonary nodule is stable. 2. Interval removal of right PleurX catheter. Trace dependent right pleural effusion is stable. 3. No new or progressive metastatic disease in the chest. No evidence of metastatic disease in the abdomen or pelvis. 4. Moderate diffuse colonic stool may indicate constipation. 5.  Aortic Atherosclerosis (ICD10-I70.0). Electronically Signed   By: Ilona Sorrel M.D.   On: 02/06/2019 13:02   Ct Abdomen Pelvis W Contrast  Result Date: 02/06/2019 CLINICAL DATA:  Stage IV right upper lobe lung adenocarcinoma diagnosed July 2019 with ongoing  targeted chemotherapy. Restaging. EXAM: CT CHEST, ABDOMEN, AND PELVIS WITH CONTRAST TECHNIQUE: Multidetector CT imaging of the chest, abdomen and pelvis was performed following the standard protocol during bolus administration of intravenous contrast. CONTRAST:  1102m OMNIPAQUE IOHEXOL 300 MG/ML  SOLN COMPARISON:  10/04/2018 CT chest, abdomen and pelvis. FINDINGS: CT CHEST FINDINGS Cardiovascular: Normal heart size. No significant pericardial effusion/thickening. Atherosclerotic nonaneurysmal thoracic aorta. Normal caliber pulmonary arteries. No central pulmonary emboli. Mediastinum/Nodes: Stable subcentimeter hypodense bilateral thyroid nodules. Unremarkable esophagus. No pathologically enlarged axillary, mediastinal or hilar lymph nodes. Lungs/Pleura: No pneumothorax. Interval removal of right PleurX catheter. Trace dependent right pleural effusion is unchanged. No left pleural effusion. Apical right upper lobe irregular solid 2.6 x 1.7 cm pulmonary nodule (series 4/image 36), previously 2.6 x 1.8 cm, stable. Anterior right lower lobe 0.8 cm irregular solid pulmonary nodule along the major fissure (series 4/image 94), previously 0.8 cm, stable. No acute consolidative airspace disease or new significant pulmonary nodules. Stable tiny calcified peripheral left lower lobe granuloma. Musculoskeletal: No aggressive appearing focal osseous lesions. Mild thoracic spondylosis. CT ABDOMEN PELVIS FINDINGS Hepatobiliary: Normal liver with no liver mass. Normal gallbladder with no radiopaque cholelithiasis. No biliary ductal dilatation. Pancreas: Normal, with no mass or duct dilation. Spleen: Normal size. No mass. Adrenals/Urinary Tract: Normal adrenals. A few  scattered subcentimeter hypodense renal cortical lesions in both kidneys are too small to characterize and are unchanged. No new renal lesions. No hydronephrosis. Normal bladder. Stomach/Bowel: Normal non-distended stomach. Normal caliber small bowel with no small bowel  wall thickening. Oral contrast transits to the colon. Appendix not discretely visualized. Moderate diffuse colonic stool. No large bowel wall thickening, significant diverticulosis or acute pericolonic fat stranding. Vascular/Lymphatic: Atherosclerotic nonaneurysmal abdominal aorta. Patent portal, splenic, hepatic and renal veins. No pathologically enlarged lymph nodes in the abdomen or pelvis. Reproductive: Status post hysterectomy, with no abnormal findings at the vaginal cuff. No adnexal mass. Other: No pneumoperitoneum, ascites or focal fluid collection. Musculoskeletal: No aggressive appearing focal osseous lesions. Moderate lower lumbar spondylosis. IMPRESSION: 1. Apical right upper lobe pulmonary nodule is stable. Anterior right lower lobe pulmonary nodule is stable. 2. Interval removal of right PleurX catheter. Trace dependent right pleural effusion is stable. 3. No new or progressive metastatic disease in the chest. No evidence of metastatic disease in the abdomen or pelvis. 4. Moderate diffuse colonic stool may indicate constipation. 5.  Aortic Atherosclerosis (ICD10-I70.0). Electronically Signed   By: Ilona Sorrel M.D.   On: 02/06/2019 13:02    ASSESSMENT AND PLAN: This is a very pleasant 68 years old never smoker African-American female recently with a stage IV non-small cell lung cancer, adenocarcinoma with positive EGFR mutation with deletion in exon 19. The patient was started on treatment with Tagrisso 80 mg p.o. daily status post 12 months of treatment. She has been tolerating this treatment well with no concerning adverse effects. She had repeat CT scan of the chest, abdomen pelvis performed recently.  I personally and independently reviewed the scans and discussed the results with the patient today. Her scan showed no concerning findings for disease recurrence or progression. I recommended for her to continue her current treatment with Tagrisso with the same dose. For the insomnia she  will try Tylenol PM on as-needed basis. She will come back for follow-up visit in 6 weeks for evaluation with repeat blood work. She was advised to call immediately if she has any concerning symptoms in the interval. The patient voices understanding of current disease status and treatment options and is in agreement with the current care plan. All questions were answered. The patient knows to call the clinic with any problems, questions or concerns. We can certainly see the patient much sooner if necessary.  Disclaimer: This note was dictated with voice recognition software. Similar sounding words can inadvertently be transcribed and may not be corrected upon review.

## 2019-02-25 DIAGNOSIS — I10 Essential (primary) hypertension: Secondary | ICD-10-CM | POA: Diagnosis not present

## 2019-02-25 DIAGNOSIS — C3491 Malignant neoplasm of unspecified part of right bronchus or lung: Secondary | ICD-10-CM | POA: Diagnosis not present

## 2019-03-05 ENCOUNTER — Other Ambulatory Visit: Payer: Self-pay

## 2019-03-05 DIAGNOSIS — Z20822 Contact with and (suspected) exposure to covid-19: Secondary | ICD-10-CM

## 2019-03-06 LAB — NOVEL CORONAVIRUS, NAA: SARS-CoV-2, NAA: NOT DETECTED

## 2019-03-24 ENCOUNTER — Inpatient Hospital Stay: Payer: Medicare HMO | Admitting: Internal Medicine

## 2019-03-24 ENCOUNTER — Other Ambulatory Visit: Payer: Self-pay | Admitting: *Deleted

## 2019-03-24 ENCOUNTER — Inpatient Hospital Stay: Payer: Medicare HMO | Attending: Internal Medicine

## 2019-03-24 ENCOUNTER — Encounter: Payer: Self-pay | Admitting: Internal Medicine

## 2019-03-24 ENCOUNTER — Telehealth: Payer: Self-pay | Admitting: Internal Medicine

## 2019-03-24 ENCOUNTER — Other Ambulatory Visit: Payer: Self-pay

## 2019-03-24 VITALS — BP 164/56 | HR 88 | Temp 98.0°F | Resp 18 | Ht 64.0 in | Wt 132.2 lb

## 2019-03-24 DIAGNOSIS — F419 Anxiety disorder, unspecified: Secondary | ICD-10-CM | POA: Insufficient documentation

## 2019-03-24 DIAGNOSIS — C3491 Malignant neoplasm of unspecified part of right bronchus or lung: Secondary | ICD-10-CM | POA: Diagnosis not present

## 2019-03-24 DIAGNOSIS — I7300 Raynaud's syndrome without gangrene: Secondary | ICD-10-CM | POA: Insufficient documentation

## 2019-03-24 DIAGNOSIS — C3411 Malignant neoplasm of upper lobe, right bronchus or lung: Secondary | ICD-10-CM | POA: Diagnosis not present

## 2019-03-24 DIAGNOSIS — C3490 Malignant neoplasm of unspecified part of unspecified bronchus or lung: Secondary | ICD-10-CM

## 2019-03-24 DIAGNOSIS — Z5111 Encounter for antineoplastic chemotherapy: Secondary | ICD-10-CM | POA: Diagnosis not present

## 2019-03-24 DIAGNOSIS — Z23 Encounter for immunization: Secondary | ICD-10-CM

## 2019-03-24 DIAGNOSIS — F329 Major depressive disorder, single episode, unspecified: Secondary | ICD-10-CM | POA: Insufficient documentation

## 2019-03-24 DIAGNOSIS — J45909 Unspecified asthma, uncomplicated: Secondary | ICD-10-CM | POA: Diagnosis not present

## 2019-03-24 DIAGNOSIS — I10 Essential (primary) hypertension: Secondary | ICD-10-CM

## 2019-03-24 DIAGNOSIS — Z79899 Other long term (current) drug therapy: Secondary | ICD-10-CM | POA: Insufficient documentation

## 2019-03-24 DIAGNOSIS — G4700 Insomnia, unspecified: Secondary | ICD-10-CM | POA: Diagnosis not present

## 2019-03-24 DIAGNOSIS — K219 Gastro-esophageal reflux disease without esophagitis: Secondary | ICD-10-CM | POA: Insufficient documentation

## 2019-03-24 DIAGNOSIS — J910 Malignant pleural effusion: Secondary | ICD-10-CM | POA: Diagnosis not present

## 2019-03-24 DIAGNOSIS — M1990 Unspecified osteoarthritis, unspecified site: Secondary | ICD-10-CM | POA: Insufficient documentation

## 2019-03-24 DIAGNOSIS — R69 Illness, unspecified: Secondary | ICD-10-CM | POA: Diagnosis not present

## 2019-03-24 LAB — CMP (CANCER CENTER ONLY)
ALT: 20 U/L (ref 0–44)
AST: 25 U/L (ref 15–41)
Albumin: 4 g/dL (ref 3.5–5.0)
Alkaline Phosphatase: 58 U/L (ref 38–126)
Anion gap: 9 (ref 5–15)
BUN: 14 mg/dL (ref 8–23)
CO2: 29 mmol/L (ref 22–32)
Calcium: 9.6 mg/dL (ref 8.9–10.3)
Chloride: 103 mmol/L (ref 98–111)
Creatinine: 1.01 mg/dL — ABNORMAL HIGH (ref 0.44–1.00)
GFR, Est AFR Am: >60 mL/min (ref >60)
GFR, Estimated: 57 mL/min — ABNORMAL LOW (ref >60)
Glucose, Bld: 93 mg/dL (ref 70–99)
Potassium: 3.7 mmol/L (ref 3.5–5.1)
Sodium: 141 mmol/L (ref 135–145)
Total Bilirubin: 0.4 mg/dL (ref 0.3–1.2)
Total Protein: 7.3 g/dL (ref 6.5–8.1)

## 2019-03-24 LAB — CBC WITH DIFFERENTIAL (CANCER CENTER ONLY)
Abs Immature Granulocytes: 0.01 10*3/uL (ref 0.00–0.07)
Basophils Absolute: 0 10*3/uL (ref 0.0–0.1)
Basophils Relative: 1 %
Eosinophils Absolute: 0.1 10*3/uL (ref 0.0–0.5)
Eosinophils Relative: 2 %
HCT: 39.8 % (ref 36.0–46.0)
Hemoglobin: 12.6 g/dL (ref 12.0–15.0)
Immature Granulocytes: 0 %
Lymphocytes Relative: 48 %
Lymphs Abs: 1.9 10*3/uL (ref 0.7–4.0)
MCH: 26.4 pg (ref 26.0–34.0)
MCHC: 31.7 g/dL (ref 30.0–36.0)
MCV: 83.4 fL (ref 80.0–100.0)
Monocytes Absolute: 0.5 10*3/uL (ref 0.1–1.0)
Monocytes Relative: 13 %
Neutro Abs: 1.4 10*3/uL — ABNORMAL LOW (ref 1.7–7.7)
Neutrophils Relative %: 36 %
Platelet Count: 211 10*3/uL (ref 150–400)
RBC: 4.77 MIL/uL (ref 3.87–5.11)
RDW: 14.3 % (ref 11.5–15.5)
WBC Count: 4 10*3/uL (ref 4.0–10.5)
nRBC: 0 % (ref 0.0–0.2)

## 2019-03-24 MED ORDER — INFLUENZA VAC A&B SA ADJ QUAD 0.5 ML IM PRSY: 0.5000 mL | PREFILLED_SYRINGE | Freq: Once | INTRAMUSCULAR | Status: DC

## 2019-03-24 MED ORDER — INFLUENZA VAC A&B SA ADJ QUAD 0.5 ML IM PRSY
PREFILLED_SYRINGE | INTRAMUSCULAR | Status: AC
  Filled 2019-03-24: qty 0.5

## 2019-03-24 MED ORDER — INFLUENZA VAC A&B SA ADJ QUAD 0.5 ML IM PRSY
0.5000 mL | PREFILLED_SYRINGE | Freq: Once | INTRAMUSCULAR | Status: AC
  Administered 2019-03-24: 0.5 mL via INTRAMUSCULAR

## 2019-03-24 NOTE — Progress Notes (Signed)
[facility] [phone]   [phone]  OFFICE PROGRESS NOTE  [name], [place] [address]  DIAGNOSIS: Stage IV (T2 a,N2, M1a) non-small cell lung cancer, adenocarcinoma diagnosed in July 2019 and presented with right upper lobe lung mass in addition to mediastinal lymphadenopathy as well as bilateral pulmonary nodules and malignant right pleural effusion.  Biomarker Findings Microsatellite status - Cannot Be Determined Tumor Mutational Burden - Cannot Be Determined Genomic Findings For a complete list of the genes assayed, please refer to the Appendix. EGFR exon 19 deletion ([id number]) TP53 Y220C 7 Disease relevant genes with no reportable alterations: KRAS, ALK, BRAF, MET, RET, ERBB2, ROS1   PRIOR THERAPY: Status post right Pleurx catheter placement by Dr. Prescott Gum for drainage of malignant right pleural effusion.  CURRENT THERAPY: Tagrisso 80 mg p.o. daily.  First dose was given on 01/29/2018.  Status post 14 months of treatment.  INTERVAL HISTORY: Allison [name] 68 y.o. female returns to the clinic today for follow-up visit.  The patient is feeling fine today with no concerning complaints.  She denied having any chest pain, shortness of breath, cough or hemoptysis.  She denied having any recent weight loss or night sweats.  She has no nausea, vomiting, diarrhea or constipation.  She has no headache or visual changes.  She continues to tolerate her treatment with Tagrisso fairly well.  She is interested in receiving flu shot today.  She is here today for evaluation and repeat blood work.  MEDICAL HISTORY: Past Medical History:  Diagnosis Date  . Anxiety   . Arthritis   . Asthma    exercise induced  . Depression    PMH  . GERD (gastroesophageal reflux disease)   . Glaucoma   . Hypertension   . Malignant pleural effusion    right  . PONV (postoperative nausea and vomiting)   . Pre-diabetes    . Raynaud's disease   . Raynaud's disease     ALLERGIES:  is allergic to penicillins and vicodin [hydrocodone-acetaminophen].  MEDICATIONS:  Current Outpatient Medications  Medication Sig Dispense Refill  . acetaminophen (TYLENOL) 500 MG tablet Take 1,000 mg by mouth every 6 (six) hours as needed (pain).     Marland Kitchen ALPRAZolam (XANAX) 0.25 MG tablet Take 0.25 mg by mouth 2 (two) times daily as needed for anxiety.     Marland Kitchen amitriptyline (ELAVIL) 10 MG tablet Take 10 mg by mouth at bedtime.    Marland Kitchen amLODipine (NORVASC) 5 MG tablet Take 5 mg by mouth daily.  3  . ascorbic acid (VITAMIN C) 500 MG/5ML syrup Take 1,000 mg by mouth daily.    . dorzolamide-timolol (COSOPT) 22.3-6.8 MG/ML ophthalmic solution Instill 1 drop in both eyes twice daily    . ezetimibe (ZETIA) 10 MG tablet Take 10 mg by mouth daily.     . hydrochlorothiazide (HYDRODIURIL) 25 MG tablet Take 25 mg by mouth daily.  4  . latanoprost (XALATAN) 0.005 % ophthalmic solution Place 1 drop into both eyes at bedtime.     Marland Kitchen loperamide (IMODIUM A-D) 2 MG tablet Take 2 mg by mouth 4 (four) times daily as needed for diarrhea or loose stools.    . Melatonin 5 MG CAPS Take 10 mg by mouth at bedtime as needed (sleep).    . Multiple Vitamins-Minerals (MULTIVITAMIN ADULT PO) Take 1 tablet by mouth every other day.     . osimertinib mesylate (TAGRISSO) 80 MG tablet Take  1 tablet (80 mg total) by mouth daily. Refill Faxed to [facility] 03/26/18 30 tablet 3  . traMADol (ULTRAM) 50 MG tablet Take 50 mg by mouth 2 (two) times daily as needed. for pain  0  . VITAMIN D, CHOLECALCIFEROL, PO Take 5,000 Units by mouth daily.      No current facility-administered medications for this visit.     SURGICAL HISTORY:  Past Surgical History:  Procedure Laterality Date  . ABDOMINAL HYSTERECTOMY     partial  . CHEST TUBE INSERTION Right 01/01/2018   Procedure: INSERTION PLEURAL DRAINAGE CATHETER;  Surgeon: Ivin Poot, MD;  Location: [facility];  Service: Thoracic;   Laterality: Right;  . COLONOSCOPY    . DILATION AND CURETTAGE OF UTERUS    . EYE SURGERY     due to Glaucoma  . REMOVAL OF PLEURAL DRAINAGE CATHETER Right 11/07/2018   Procedure: REMOVAL OF PLEURAL DRAINAGE CATHETER;  Surgeon: Ivin Poot, MD;  Location: [facility];  Service: Thoracic;  Laterality: Right;  . ROTATOR CUFF REPAIR    . TUBAL LIGATION    . WISDOM TOOTH EXTRACTION      REVIEW OF SYSTEMS:  A comprehensive review of systems was negative.   PHYSICAL EXAMINATION: General appearance: alert, cooperative and no distress Head: Normocephalic, without obvious abnormality, atraumatic Neck: no adenopathy, no JVD, supple, symmetrical, trachea midline and thyroid not enlarged, symmetric, no tenderness/mass/nodules Lymph nodes: Cervical, supraclavicular, and axillary nodes normal. Resp: clear to auscultation bilaterally Back: symmetric, no curvature. ROM normal. No CVA tenderness. Cardio: regular rate and rhythm, S1, S2 normal, no murmur, click, rub or gallop GI: soft, non-tender; bowel sounds normal; no masses,  no organomegaly Extremities: extremities normal, atraumatic, no cyanosis or edema  ECOG PERFORMANCE STATUS: 0 - Asymptomatic  Blood pressure (!) 164/56, pulse 88, temperature 98 F (36.7 C), temperature source Temporal, resp. rate 18, height '5\' 4"'  (1.626 m), weight 132 lb 3.2 oz (60 kg), SpO2 100 %.  LABORATORY DATA: Lab Results  Component Value Date   WBC 4.0 03/24/2019   HGB 12.6 03/24/2019   HCT 39.8 03/24/2019   MCV 83.4 03/24/2019   PLT 211 03/24/2019      Chemistry      Component Value Date/Time   NA 140 02/06/2019 0915   K 4.1 02/06/2019 0915   CL 105 02/06/2019 0915   CO2 25 02/06/2019 0915   BUN 18 02/06/2019 0915   CREATININE 0.98 02/06/2019 0915      Component Value Date/Time   CALCIUM 9.6 02/06/2019 0915   ALKPHOS 59 02/06/2019 0915   AST 26 02/06/2019 0915   ALT 22 02/06/2019 0915   BILITOT 0.3 02/06/2019 0915       RADIOGRAPHIC STUDIES:  No results found.  ASSESSMENT AND PLAN: This is a very pleasant 68 years old never smoker African-American female recently with a stage IV non-small cell lung cancer, adenocarcinoma with positive EGFR mutation with deletion in exon 19. The patient was started on treatment with Tagrisso 80 mg p.o. daily status post 14 months of treatment. She has been tolerating this treatment well with no concerning adverse effects. I recommended for her to continue her current treatment with Tagrisso with the same dose. I will see her back for follow-up visit in 6 weeks for evaluation with repeat CT scan of the chest, abdomen pelvis for restaging of her disease. For the insomnia she will try Tylenol PM on as-needed basis. For the hypertension, the patient was advised to take her blood pressure  medication as prescribed and to monitor it closely at home. The patient will receive flu vaccine today. She was advised to call immediately if she has any concerning symptoms in the interval. The patient voices understanding of current disease status and treatment options and is in agreement with the current care plan. All questions were answered. The patient knows to call the clinic with any problems, questions or concerns. We can certainly see the patient much sooner if necessary.  Disclaimer: This note was dictated with voice recognition software. Similar sounding words can inadvertently be transcribed and may not be corrected upon review.

## 2019-03-24 NOTE — Progress Notes (Signed)
VO for pt to receive Flu shot.

## 2019-03-24 NOTE — Telephone Encounter (Signed)
Patient decline avs and calendar. She received contrast

## 2019-03-24 NOTE — Addendum Note (Signed)
Addended by: Lucile Crater on: 03/24/2019 09:16 AM   Modules accepted: Orders

## 2019-04-02 ENCOUNTER — Ambulatory Visit: Payer: Medicare HMO | Admitting: Cardiothoracic Surgery

## 2019-04-08 ENCOUNTER — Other Ambulatory Visit: Payer: Self-pay | Admitting: Cardiothoracic Surgery

## 2019-04-08 DIAGNOSIS — C3491 Malignant neoplasm of unspecified part of right bronchus or lung: Secondary | ICD-10-CM

## 2019-04-09 ENCOUNTER — Encounter: Payer: Self-pay | Admitting: Cardiothoracic Surgery

## 2019-04-09 ENCOUNTER — Ambulatory Visit: Payer: Medicare HMO | Admitting: Cardiothoracic Surgery

## 2019-04-09 ENCOUNTER — Other Ambulatory Visit: Payer: Self-pay

## 2019-04-09 ENCOUNTER — Ambulatory Visit
Admission: RE | Admit: 2019-04-09 | Discharge: 2019-04-09 | Disposition: A | Discharge status: Home / Self Care | Payer: Medicare HMO | Source: Ambulatory Visit | Attending: Cardiothoracic Surgery | Admitting: Cardiothoracic Surgery

## 2019-04-09 VITALS — BP 148/77 | HR 72 | Temp 97.7°F | Resp 16 | Ht 64.0 in | Wt 129.0 lb

## 2019-04-09 DIAGNOSIS — R911 Solitary pulmonary nodule: Secondary | ICD-10-CM | POA: Diagnosis not present

## 2019-04-09 DIAGNOSIS — R918 Other nonspecific abnormal finding of lung field: Secondary | ICD-10-CM | POA: Diagnosis not present

## 2019-04-09 DIAGNOSIS — Z9889 Other specified postprocedural states: Secondary | ICD-10-CM

## 2019-04-09 DIAGNOSIS — J910 Malignant pleural effusion: Secondary | ICD-10-CM

## 2019-04-09 DIAGNOSIS — C3491 Malignant neoplasm of unspecified part of right bronchus or lung: Secondary | ICD-10-CM

## 2019-04-09 NOTE — Progress Notes (Signed)
PCP is [name], MD Referring Provider is [name], MD  Chief Complaint  Patient presents with  . Pleural Effusion    3 month f/u with a CXR    HPI: Patient returns for follow-up with chest x-ray. Earlier this year she had a Pleurx catheter for adenocarcinoma of the right lung with effusion. With her Tagrisso therapy effusion resolved and Pleurx was removed. She has had some chest tightness and pleuritic discomfort However chest x-ray is clear without significant right pleural effusion.  There is still a small nodule in the right upper lung field, stable   Past Medical History:  Diagnosis Date  . Anxiety   . Arthritis   . Asthma    exercise induced  . Depression    PMH  . GERD (gastroesophageal reflux disease)   . Glaucoma   . Hypertension   . Malignant pleural effusion    right  . PONV (postoperative nausea and vomiting)   . Pre-diabetes   . Raynaud's disease   . Raynaud's disease     Past Surgical History:  Procedure Laterality Date  . ABDOMINAL HYSTERECTOMY     partial  . CHEST TUBE INSERTION Right 01/01/2018   Procedure: INSERTION PLEURAL DRAINAGE CATHETER;  Surgeon: Ivin Poot, MD;  Location: [facility];  Service: Thoracic;  Laterality: Right;  . COLONOSCOPY    . DILATION AND CURETTAGE OF UTERUS    . EYE SURGERY     due to Glaucoma  . REMOVAL OF PLEURAL DRAINAGE CATHETER Right 11/07/2018   Procedure: REMOVAL OF PLEURAL DRAINAGE CATHETER;  Surgeon: Ivin Poot, MD;  Location: [facility];  Service: Thoracic;  Laterality: Right;  . ROTATOR CUFF REPAIR    . TUBAL LIGATION    . WISDOM TOOTH EXTRACTION      Family History  Problem Relation Age of Onset  . Heart disease Sister   . Heart disease Brother   . Lung cancer Other     Social History Social History   Tobacco Use  . Smoking status: Never Smoker  . Smokeless tobacco: Never Used  Substance Use Topics  . Alcohol use: Yes    Comment: up to 3 drinks per week  . Drug use: No   Comment: CBD oil     Current Outpatient Medications  Medication Sig Dispense Refill  . acetaminophen (TYLENOL) 500 MG tablet Take 1,000 mg by mouth every 6 (six) hours as needed (pain).     Marland Kitchen ALPRAZolam (XANAX) 0.25 MG tablet Take 0.25 mg by mouth 2 (two) times daily as needed for anxiety.     Marland Kitchen amitriptyline (ELAVIL) 10 MG tablet Take 10 mg by mouth at bedtime.    Marland Kitchen amLODipine (NORVASC) 5 MG tablet Take 5 mg by mouth daily.  3  . ascorbic acid (VITAMIN C) 500 MG/5ML syrup Take 1,000 mg by mouth daily.    . dorzolamide-timolol (COSOPT) 22.3-6.8 MG/ML ophthalmic solution Instill 1 drop in both eyes twice daily    . ezetimibe (ZETIA) 10 MG tablet Take 10 mg by mouth daily.     . hydrochlorothiazide (HYDRODIURIL) 25 MG tablet Take 25 mg by mouth daily.  4  . Lactobacillus (BIOTINEX) CAPS Take 5,000 mg by mouth daily.    Marland Kitchen latanoprost (XALATAN) 0.005 % ophthalmic solution Place 1 drop into both eyes at bedtime.     Marland Kitchen loperamide (IMODIUM A-D) 2 MG tablet Take 2 mg by mouth 4 (four) times daily as needed for diarrhea or loose stools.    Marland Kitchen  Melatonin 5 MG CAPS Take 10 mg by mouth at bedtime as needed (sleep).    . Multiple Vitamins-Minerals (MULTIVITAMIN ADULT PO) Take 1 tablet by mouth every other day.     . osimertinib mesylate (TAGRISSO) 80 MG tablet Take 1 tablet (80 mg total) by mouth daily. Refill Faxed to [employer] 03/26/18 30 tablet 3  . traMADol (ULTRAM) 50 MG tablet Take 50 mg by mouth 2 (two) times daily as needed. for pain  0  . VITAMIN D, CHOLECALCIFEROL, PO Take 5,000 Units by mouth daily.      No current facility-administered medications for this visit.     Allergies  Allergen Reactions  . Penicillins Other (See Comments)    SYNCOPE PATIENT HAS HAD A PCN REACTION WITH IMMEDIATE RASH, FACIAL/TONGUE/THROAT SWELLING, SOB, OR LIGHTHEADEDNESS WITH HYPOTENSION:  #  #  YES  #  # Has patient had a PCN reaction causing severe rash involving mucus membranes or skin necrosis: No Has patient had  a PCN reaction that required hospitalization: No Has patient had a PCN reaction occurring within the last 10 years: No   . Vicodin [Hydrocodone-Acetaminophen] Other (See Comments)    Sped up heart and breathing    Review of Systems  No fever No weight loss No headache No shortness of breath Mild pleuritic chest pain right greater than left Had a flu shot 10 days ago  BP (!) 148/77 (BP Location: Right Arm, Patient Position: Sitting, Cuff Size: Normal)   Pulse 72   Temp 97.7 F (36.5 C)   Resp 16   Ht 5\' 4"  (1.626 m)   Wt 129 lb (58.5 kg)   LMP  (LMP Unknown)   SpO2 99% Comment: RA  BMI 22.14 kg/m  Physical Exam      Exam    General- alert and comfortable    Neck- no JVD, no cervical adenopathy palpable, no carotid bruit   Lungs- clear without rales, wheezes   Cor- regular rate and rhythm, no murmur , gallop   Abdomen- soft, non-tender   Extremities - warm, non-tender, minimal edema   Neuro- oriented, appropriate, no focal weakness   Diagnostic Tests: Chest x-ray done today personally reviewed showing no effusion no active disease   Impression: No evidence recurrent pleural effusion after removal of Pleurx catheter for malignant right pleural effusion  Plan: Patient has a visit scheduled with Dr. Julien Nordmann with CT scan in November.  I will see the patient back in about 12 weeks with chest x-ray.  Len Childs, MD [facility] [phone]

## 2019-04-10 ENCOUNTER — Other Ambulatory Visit: Payer: Self-pay

## 2019-04-10 ENCOUNTER — Telehealth: Payer: Self-pay | Admitting: *Deleted

## 2019-04-10 DIAGNOSIS — Z20828 Contact with and (suspected) exposure to other viral communicable diseases: Secondary | ICD-10-CM | POA: Diagnosis not present

## 2019-04-10 DIAGNOSIS — Z20822 Contact with and (suspected) exposure to covid-19: Secondary | ICD-10-CM

## 2019-04-10 NOTE — Telephone Encounter (Signed)
Pt called with concerns of " reflux/burning and queasy feeling"  Reviewed with MD, returned call to pt with MD recommendations to try Prilosec or Nexium. Pt verbalized understanding. No further concerns.

## 2019-04-12 LAB — NOVEL CORONAVIRUS, NAA: SARS-CoV-2, NAA: NOT DETECTED

## 2019-05-02 ENCOUNTER — Other Ambulatory Visit: Payer: Self-pay

## 2019-05-02 ENCOUNTER — Other Ambulatory Visit: Payer: Medicare HMO

## 2019-05-02 ENCOUNTER — Inpatient Hospital Stay: Payer: Medicare HMO | Attending: Internal Medicine

## 2019-05-02 ENCOUNTER — Ambulatory Visit (HOSPITAL_COMMUNITY)
Admission: RE | Admit: 2019-05-02 | Discharge: 2019-05-02 | Disposition: A | Discharge status: Home / Self Care | Payer: Medicare HMO | Source: Ambulatory Visit | Attending: Internal Medicine | Admitting: Internal Medicine

## 2019-05-02 ENCOUNTER — Encounter (HOSPITAL_COMMUNITY): Payer: Self-pay

## 2019-05-02 DIAGNOSIS — C3490 Malignant neoplasm of unspecified part of unspecified bronchus or lung: Secondary | ICD-10-CM | POA: Insufficient documentation

## 2019-05-02 DIAGNOSIS — R69 Illness, unspecified: Secondary | ICD-10-CM | POA: Diagnosis not present

## 2019-05-02 DIAGNOSIS — M1990 Unspecified osteoarthritis, unspecified site: Secondary | ICD-10-CM | POA: Insufficient documentation

## 2019-05-02 DIAGNOSIS — Z79899 Other long term (current) drug therapy: Secondary | ICD-10-CM | POA: Diagnosis not present

## 2019-05-02 DIAGNOSIS — J45909 Unspecified asthma, uncomplicated: Secondary | ICD-10-CM | POA: Insufficient documentation

## 2019-05-02 DIAGNOSIS — I10 Essential (primary) hypertension: Secondary | ICD-10-CM | POA: Diagnosis not present

## 2019-05-02 DIAGNOSIS — F329 Major depressive disorder, single episode, unspecified: Secondary | ICD-10-CM | POA: Diagnosis not present

## 2019-05-02 DIAGNOSIS — I700 Atherosclerosis of aorta: Secondary | ICD-10-CM | POA: Diagnosis not present

## 2019-05-02 DIAGNOSIS — J910 Malignant pleural effusion: Secondary | ICD-10-CM | POA: Diagnosis not present

## 2019-05-02 DIAGNOSIS — I7300 Raynaud's syndrome without gangrene: Secondary | ICD-10-CM | POA: Diagnosis not present

## 2019-05-02 DIAGNOSIS — C3411 Malignant neoplasm of upper lobe, right bronchus or lung: Secondary | ICD-10-CM | POA: Insufficient documentation

## 2019-05-02 DIAGNOSIS — F419 Anxiety disorder, unspecified: Secondary | ICD-10-CM | POA: Diagnosis not present

## 2019-05-02 DIAGNOSIS — Z9221 Personal history of antineoplastic chemotherapy: Secondary | ICD-10-CM | POA: Diagnosis not present

## 2019-05-02 DIAGNOSIS — K219 Gastro-esophageal reflux disease without esophagitis: Secondary | ICD-10-CM | POA: Diagnosis not present

## 2019-05-02 DIAGNOSIS — C771 Secondary and unspecified malignant neoplasm of intrathoracic lymph nodes: Secondary | ICD-10-CM | POA: Insufficient documentation

## 2019-05-02 HISTORY — DX: Malignant (primary) neoplasm, unspecified: C80.1

## 2019-05-02 LAB — CBC WITH DIFFERENTIAL (CANCER CENTER ONLY)
Abs Immature Granulocytes: 0 10*3/uL (ref 0.00–0.07)
Basophils Absolute: 0 10*3/uL (ref 0.0–0.1)
Basophils Relative: 1 %
Eosinophils Absolute: 0.1 10*3/uL (ref 0.0–0.5)
Eosinophils Relative: 2 %
HCT: 40.2 % (ref 36.0–46.0)
Hemoglobin: 12.7 g/dL (ref 12.0–15.0)
Immature Granulocytes: 0 %
Lymphocytes Relative: 45 %
Lymphs Abs: 1.7 10*3/uL (ref 0.7–4.0)
MCH: 26.1 pg (ref 26.0–34.0)
MCHC: 31.6 g/dL (ref 30.0–36.0)
MCV: 82.7 fL (ref 80.0–100.0)
Monocytes Absolute: 0.4 10*3/uL (ref 0.1–1.0)
Monocytes Relative: 11 %
Neutro Abs: 1.6 10*3/uL — ABNORMAL LOW (ref 1.7–7.7)
Neutrophils Relative %: 41 %
Platelet Count: 227 10*3/uL (ref 150–400)
RBC: 4.86 MIL/uL (ref 3.87–5.11)
RDW: 14.1 % (ref 11.5–15.5)
WBC Count: 3.8 10*3/uL — ABNORMAL LOW (ref 4.0–10.5)
nRBC: 0 % (ref 0.0–0.2)

## 2019-05-02 LAB — CMP (CANCER CENTER ONLY)
ALT: 27 U/L (ref 0–44)
AST: 26 U/L (ref 15–41)
Albumin: 4.2 g/dL (ref 3.5–5.0)
Alkaline Phosphatase: 65 U/L (ref 38–126)
Anion gap: 9 (ref 5–15)
BUN: 13 mg/dL (ref 8–23)
CO2: 31 mmol/L (ref 22–32)
Calcium: 9.8 mg/dL (ref 8.9–10.3)
Chloride: 103 mmol/L (ref 98–111)
Creatinine: 0.92 mg/dL (ref 0.44–1.00)
GFR, Est AFR Am: >60 mL/min (ref >60)
GFR, Estimated: >60 mL/min (ref >60)
Glucose, Bld: 80 mg/dL (ref 70–99)
Potassium: 3.4 mmol/L — ABNORMAL LOW (ref 3.5–5.1)
Sodium: 143 mmol/L (ref 135–145)
Total Bilirubin: 0.3 mg/dL (ref 0.3–1.2)
Total Protein: 8 g/dL (ref 6.5–8.1)

## 2019-05-02 MED ORDER — SODIUM CHLORIDE (PF) 0.9 % IJ SOLN
INTRAMUSCULAR | Status: AC
  Filled 2019-05-02: qty 50

## 2019-05-02 MED ORDER — IOHEXOL 300 MG/ML  SOLN
100.0000 mL | Freq: Once | INTRAMUSCULAR | Status: AC | PRN
  Administered 2019-05-02: 100 mL via INTRAVENOUS

## 2019-05-05 ENCOUNTER — Telehealth: Payer: Self-pay | Admitting: Internal Medicine

## 2019-05-05 ENCOUNTER — Inpatient Hospital Stay: Payer: Medicare HMO | Admitting: Internal Medicine

## 2019-05-05 ENCOUNTER — Encounter: Payer: Self-pay | Admitting: Internal Medicine

## 2019-05-05 ENCOUNTER — Other Ambulatory Visit: Payer: Self-pay

## 2019-05-05 VITALS — BP 131/82 | HR 67 | Temp 97.9°F | Resp 18 | Ht 64.0 in | Wt 131.3 lb

## 2019-05-05 DIAGNOSIS — I10 Essential (primary) hypertension: Secondary | ICD-10-CM

## 2019-05-05 DIAGNOSIS — Z5111 Encounter for antineoplastic chemotherapy: Secondary | ICD-10-CM

## 2019-05-05 DIAGNOSIS — C3491 Malignant neoplasm of unspecified part of right bronchus or lung: Secondary | ICD-10-CM | POA: Diagnosis not present

## 2019-05-05 DIAGNOSIS — C3411 Malignant neoplasm of upper lobe, right bronchus or lung: Secondary | ICD-10-CM | POA: Diagnosis not present

## 2019-05-05 DIAGNOSIS — J910 Malignant pleural effusion: Secondary | ICD-10-CM | POA: Diagnosis not present

## 2019-05-05 DIAGNOSIS — J45909 Unspecified asthma, uncomplicated: Secondary | ICD-10-CM | POA: Diagnosis not present

## 2019-05-05 DIAGNOSIS — Z9221 Personal history of antineoplastic chemotherapy: Secondary | ICD-10-CM | POA: Diagnosis not present

## 2019-05-05 DIAGNOSIS — I700 Atherosclerosis of aorta: Secondary | ICD-10-CM | POA: Diagnosis not present

## 2019-05-05 DIAGNOSIS — R69 Illness, unspecified: Secondary | ICD-10-CM | POA: Diagnosis not present

## 2019-05-05 DIAGNOSIS — I7300 Raynaud's syndrome without gangrene: Secondary | ICD-10-CM | POA: Diagnosis not present

## 2019-05-05 DIAGNOSIS — C771 Secondary and unspecified malignant neoplasm of intrathoracic lymph nodes: Secondary | ICD-10-CM | POA: Diagnosis not present

## 2019-05-05 NOTE — Telephone Encounter (Signed)
Scheduled appt per 11/16 los.  Patient declined calendar and avs.  Pt my chart active

## 2019-05-05 NOTE — Progress Notes (Signed)
[facility] [phone]   [phone]  OFFICE PROGRESS NOTE  [name], [place] [address]  DIAGNOSIS: Stage IV (T2 a,N2, M1a) non-small cell lung cancer, adenocarcinoma diagnosed in July 2019 and presented with right upper lobe lung mass in addition to mediastinal lymphadenopathy as well as bilateral pulmonary nodules and malignant right pleural effusion.  Biomarker Findings Microsatellite status - Cannot Be Determined Tumor Mutational Burden - Cannot Be Determined Genomic Findings For a complete list of the genes assayed, please refer to the Appendix. EGFR exon 19 deletion ([id number]) TP53 Y220C 7 Disease relevant genes with no reportable alterations: KRAS, ALK, BRAF, MET, RET, ERBB2, ROS1   PRIOR THERAPY: Status post right Pleurx catheter placement by Dr. Prescott Gum for drainage of malignant right pleural effusion.  CURRENT THERAPY: Tagrisso 80 mg p.o. daily.  First dose was given on 01/29/2018.  Status post 15 months of treatment.  INTERVAL HISTORY: Allison [name] 68 y.o. female returns to the clinic today for 6 weeks follow-up visit.  The patient is feeling fine today with no concerning complaints except for intermittent cough with no sputum production.  She denied having any chest pain, shortness of breath or hemoptysis.  She denied having any recent weight loss or night sweats.  She has no nausea, vomiting, diarrhea or constipation.  She has no headache or visual changes.  The patient has no fever or chills.  She continues to tolerate her treatment with Tagrisso fairly well.  She is here today for evaluation with repeat CT scan of the chest, abdomen pelvis for restaging of her disease.   MEDICAL HISTORY: Past Medical History:  Diagnosis Date   Anxiety    Arthritis    Asthma    exercise induced   Depression    PMH   GERD (gastroesophageal reflux disease)    Glaucoma    Hypertension      lung ca dx'd 11/2017   right   Malignant pleural effusion    right   PONV (postoperative nausea and vomiting)    Pre-diabetes    Raynaud's disease    Raynaud's disease     ALLERGIES:  is allergic to penicillins and vicodin [hydrocodone-acetaminophen].  MEDICATIONS:  Current Outpatient Medications  Medication Sig Dispense Refill   acetaminophen (TYLENOL) 500 MG tablet Take 1,000 mg by mouth every 6 (six) hours as needed (pain).      ALPRAZolam (XANAX) 0.25 MG tablet Take 0.25 mg by mouth 2 (two) times daily as needed for anxiety.      amitriptyline (ELAVIL) 10 MG tablet Take 10 mg by mouth at bedtime.     amLODipine (NORVASC) 5 MG tablet Take 5 mg by mouth daily.  3   ascorbic acid (VITAMIN C) 500 MG/5ML syrup Take 1,000 mg by mouth daily.     dorzolamide-timolol (COSOPT) 22.3-6.8 MG/ML ophthalmic solution Instill 1 drop in both eyes twice daily     ezetimibe (ZETIA) 10 MG tablet Take 10 mg by mouth daily.      hydrochlorothiazide (HYDRODIURIL) 25 MG tablet Take 25 mg by mouth daily.  4   Lactobacillus (BIOTINEX) CAPS Take 5,000 mg by mouth daily.     latanoprost (XALATAN) 0.005 % ophthalmic solution Place 1 drop into both eyes at bedtime.      loperamide (IMODIUM A-D) 2 MG tablet Take 2 mg by mouth 4 (four) times daily as needed for diarrhea or loose stools.     Melatonin  5 MG CAPS Take 10 mg by mouth at bedtime as needed (sleep).     Multiple Vitamins-Minerals (MULTIVITAMIN ADULT PO) Take 1 tablet by mouth every other day.      osimertinib mesylate (TAGRISSO) 80 MG tablet Take 1 tablet (80 mg total) by mouth daily. Refill Faxed to [facility] 03/26/18 30 tablet 3   traMADol (ULTRAM) 50 MG tablet Take 50 mg by mouth 2 (two) times daily as needed. for pain  0   VITAMIN D, CHOLECALCIFEROL, PO Take 5,000 Units by mouth daily.      No current facility-administered medications for this visit.     SURGICAL HISTORY:  Past Surgical History:  Procedure Laterality Date    ABDOMINAL HYSTERECTOMY     partial   CHEST TUBE INSERTION Right 01/01/2018   Procedure: INSERTION PLEURAL DRAINAGE CATHETER;  Surgeon: Ivin Poot, MD;  Location: [facility];  Service: Thoracic;  Laterality: Right;   COLONOSCOPY     DILATION AND CURETTAGE OF UTERUS     EYE SURGERY     due to Glaucoma   REMOVAL OF PLEURAL DRAINAGE CATHETER Right 11/07/2018   Procedure: REMOVAL OF PLEURAL DRAINAGE CATHETER;  Surgeon: Ivin Poot, MD;  Location: [facility];  Service: Thoracic;  Laterality: Right;   ROTATOR CUFF REPAIR     TUBAL LIGATION     WISDOM TOOTH EXTRACTION      REVIEW OF SYSTEMS:  Constitutional: negative Eyes: negative Ears, nose, mouth, throat, and face: negative Respiratory: positive for cough Cardiovascular: negative Gastrointestinal: negative Genitourinary:negative Integument/breast: negative Hematologic/lymphatic: negative Musculoskeletal:negative Neurological: negative Behavioral/Psych: negative Endocrine: negative Allergic/Immunologic: negative   PHYSICAL EXAMINATION: General appearance: alert, cooperative and no distress Head: Normocephalic, without obvious abnormality, atraumatic Neck: no adenopathy, no JVD, supple, symmetrical, trachea midline and thyroid not enlarged, symmetric, no tenderness/mass/nodules Lymph nodes: Cervical, supraclavicular, and axillary nodes normal. Resp: clear to auscultation bilaterally Back: symmetric, no curvature. ROM normal. No CVA tenderness. Cardio: regular rate and rhythm, S1, S2 normal, no murmur, click, rub or gallop GI: soft, non-tender; bowel sounds normal; no masses,  no organomegaly Extremities: extremities normal, atraumatic, no cyanosis or edema Neurologic: Alert and oriented X 3, normal strength and tone. Normal symmetric reflexes. Normal coordination and gait  ECOG PERFORMANCE STATUS: 0 - Asymptomatic  Blood pressure 131/82, pulse 67, temperature 97.9 F (36.6 C), temperature source Temporal, resp. rate 18,  height '5\' 4"'  (1.626 m), weight 131 lb 4.8 oz (59.6 kg), SpO2 100 %.  LABORATORY DATA: Lab Results  Component Value Date   WBC 3.8 (L) 05/02/2019   HGB 12.7 05/02/2019   HCT 40.2 05/02/2019   MCV 82.7 05/02/2019   PLT 227 05/02/2019      Chemistry      Component Value Date/Time   NA 143 05/02/2019 0814   K 3.4 (L) 05/02/2019 0814   CL 103 05/02/2019 0814   CO2 31 05/02/2019 0814   BUN 13 05/02/2019 0814   CREATININE 0.92 05/02/2019 0814      Component Value Date/Time   CALCIUM 9.8 05/02/2019 0814   ALKPHOS 65 05/02/2019 0814   AST 26 05/02/2019 0814   ALT 27 05/02/2019 0814   BILITOT 0.3 05/02/2019 0814       RADIOGRAPHIC STUDIES: Dg Chest 2 View  Result Date: 04/09/2019 CLINICAL DATA:  Hx pleural drainage catheter 10/2018, sob, chest fullness, never smoked EXAM: CHEST - 2 VIEW COMPARISON:  12/26/2018 and previous FINDINGS: Stable right apical pulmonary nodule 2 cm. Lungs otherwise clear. Heart size and mediastinal contours  are within normal limits. No effusion. Visualized bones unremarkable. IMPRESSION: Stable right apical pulmonary nodule. No acute disease. Electronically Signed   By: Lucrezia Europe M.D.   On: 04/09/2019 10:17   Ct Chest W Contrast  Result Date: 05/02/2019 CLINICAL DATA:  Lung cancer staging EXAM: CT CHEST, ABDOMEN, AND PELVIS WITH CONTRAST TECHNIQUE: Multidetector CT imaging of the chest, abdomen and pelvis was performed following the standard protocol during bolus administration of intravenous contrast. CONTRAST:  131m OMNIPAQUE IOHEXOL 300 MG/ML SOLN, additional oral enteric contrast COMPARISON:  02/03/2019, 10/04/2018 FINDINGS: CT CHEST FINDINGS Cardiovascular: Aortic atherosclerosis. Normal heart size. No pericardial effusion. Mediastinum/Nodes: No enlarged mediastinal, hilar, or axillary lymph nodes. Thyroid gland, trachea, and esophagus demonstrate no significant findings. Lungs/Pleura: Stable post treatment appearance of a mass of the right pulmonary  apex measuring 2.6 x 1.8 cm (series 4, image 32). Stable 8 mm pulmonary nodule of the anterior right lower lobe abutting the major fissure (series 4, image 92). Unchanged trace right pleural effusion. Musculoskeletal: No chest wall mass or suspicious bone lesions identified. CT ABDOMEN PELVIS FINDINGS Hepatobiliary: No solid liver abnormality is seen. No gallstones, gallbladder wall thickening, or biliary dilatation. Pancreas: Unremarkable. No pancreatic ductal dilatation or surrounding inflammatory changes. Spleen: Normal in size without significant abnormality. Adrenals/Urinary Tract: Adrenal glands are unremarkable. Kidneys are normal, without renal calculi, solid lesion, or hydronephrosis. Bladder is unremarkable. Stomach/Bowel: Stomach is within normal limits. Appendix is not clearly visualized and may be surgically absent. No evidence of bowel wall thickening, distention, or inflammatory changes. Large burden of stool throughout the colon. Vascular/Lymphatic: No significant vascular findings are present. No enlarged abdominal or pelvic lymph nodes. Reproductive: Status post hysterectomy. Other: No abdominal wall hernia or abnormality. No abdominopelvic ascites. Musculoskeletal: No acute or significant osseous findings. IMPRESSION: 1. Stable post treatment appearance of a mass of the right pulmonary apex measuring 2.6 x 1.8 cm (series 4, image 32). Stable 8 mm pulmonary nodule of the anterior right lower lobe abutting the major fissure (series 4, image 92). 2.  Unchanged trace right pleural effusion. 3.  Aortic Atherosclerosis (ICD10-I70.0). 4.  Large burden of stool in the colon. Electronically Signed   By: AEddie CandleM.D.   On: 05/02/2019 10:14   Ct Abdomen Pelvis W Contrast  Result Date: 05/02/2019 CLINICAL DATA:  Lung cancer staging EXAM: CT CHEST, ABDOMEN, AND PELVIS WITH CONTRAST TECHNIQUE: Multidetector CT imaging of the chest, abdomen and pelvis was performed following the standard protocol  during bolus administration of intravenous contrast. CONTRAST:  1021mOMNIPAQUE IOHEXOL 300 MG/ML SOLN, additional oral enteric contrast COMPARISON:  02/03/2019, 10/04/2018 FINDINGS: CT CHEST FINDINGS Cardiovascular: Aortic atherosclerosis. Normal heart size. No pericardial effusion. Mediastinum/Nodes: No enlarged mediastinal, hilar, or axillary lymph nodes. Thyroid gland, trachea, and esophagus demonstrate no significant findings. Lungs/Pleura: Stable post treatment appearance of a mass of the right pulmonary apex measuring 2.6 x 1.8 cm (series 4, image 32). Stable 8 mm pulmonary nodule of the anterior right lower lobe abutting the major fissure (series 4, image 92). Unchanged trace right pleural effusion. Musculoskeletal: No chest wall mass or suspicious bone lesions identified. CT ABDOMEN PELVIS FINDINGS Hepatobiliary: No solid liver abnormality is seen. No gallstones, gallbladder wall thickening, or biliary dilatation. Pancreas: Unremarkable. No pancreatic ductal dilatation or surrounding inflammatory changes. Spleen: Normal in size without significant abnormality. Adrenals/Urinary Tract: Adrenal glands are unremarkable. Kidneys are normal, without renal calculi, solid lesion, or hydronephrosis. Bladder is unremarkable. Stomach/Bowel: Stomach is within normal limits. Appendix is not clearly visualized and  may be surgically absent. No evidence of bowel wall thickening, distention, or inflammatory changes. Large burden of stool throughout the colon. Vascular/Lymphatic: No significant vascular findings are present. No enlarged abdominal or pelvic lymph nodes. Reproductive: Status post hysterectomy. Other: No abdominal wall hernia or abnormality. No abdominopelvic ascites. Musculoskeletal: No acute or significant osseous findings. IMPRESSION: 1. Stable post treatment appearance of a mass of the right pulmonary apex measuring 2.6 x 1.8 cm (series 4, image 32). Stable 8 mm pulmonary nodule of the anterior right lower  lobe abutting the major fissure (series 4, image 92). 2.  Unchanged trace right pleural effusion. 3.  Aortic Atherosclerosis (ICD10-I70.0). 4.  Large burden of stool in the colon. Electronically Signed   By: Eddie Candle M.D.   On: 05/02/2019 10:14    ASSESSMENT AND PLAN: This is a very pleasant 68 years old never smoker African-American female recently with a stage IV non-small cell lung cancer, adenocarcinoma with positive EGFR mutation with deletion in exon 19. The patient was started on treatment with Tagrisso 80 mg p.o. daily status post 15 months of treatment. She continues to tolerate her treatment well with no concerning adverse effects. The patient had repeat CT scan of the chest, abdomen pelvis performed recently.  I personally and independently reviewed the scans and discussed the results with the patient today. Her scan showed no concerning findings for disease progression. I recommended for the patient to continue her current treatment with Tagrisso with the same dose. For hypertension, she will continue her current blood pressure medication and monitor it closely at home. I will see her back for follow-up visit in 6 weeks for evaluation and repeat blood work. The patient voices understanding of current disease status and treatment options and is in agreement with the current care plan. All questions were answered. The patient knows to call the clinic with any problems, questions or concerns. We can certainly see the patient much sooner if necessary.  Disclaimer: This note was dictated with voice recognition software. Similar sounding words can inadvertently be transcribed and may not be corrected upon review.

## 2019-05-13 ENCOUNTER — Other Ambulatory Visit: Payer: Self-pay | Admitting: Medical Oncology

## 2019-05-13 DIAGNOSIS — C3491 Malignant neoplasm of unspecified part of right bronchus or lung: Secondary | ICD-10-CM

## 2019-05-13 MED ORDER — OSIMERTINIB MESYLATE 80 MG PO TABS: 80.0000 mg | ORAL_TABLET | Freq: Every day | ORAL | 3 refills | Status: DC

## 2019-05-13 NOTE — Progress Notes (Signed)
[facility] faxed.

## 2019-05-13 NOTE — Addendum Note (Signed)
Addended by: Ardeen Garland on: 05/13/2019 01:36 PM   Modules accepted: Orders

## 2019-05-19 DIAGNOSIS — R69 Illness, unspecified: Secondary | ICD-10-CM | POA: Diagnosis not present

## 2019-05-23 ENCOUNTER — Other Ambulatory Visit: Payer: Self-pay

## 2019-05-23 DIAGNOSIS — Z20822 Contact with and (suspected) exposure to covid-19: Secondary | ICD-10-CM

## 2019-05-26 LAB — NOVEL CORONAVIRUS, NAA: SARS-CoV-2, NAA: NOT DETECTED

## 2019-05-28 ENCOUNTER — Telehealth: Payer: Self-pay | Admitting: Pharmacist

## 2019-05-28 NOTE — Telephone Encounter (Signed)
Oral Chemotherapy Pharmacist Encounter   Medicare [employer] attestation for 2021 re-enrollment faxed to [employer]. We will following up with [employer] about her 2021 re-enrollment status.   Darl Pikes, PharmD, BCPS, [facility] Hematology/Oncology Clinical Pharmacist [facility] [phone]  05/28/2019 11:32 AM

## 2019-05-30 ENCOUNTER — Encounter: Payer: Self-pay | Admitting: Internal Medicine

## 2019-06-09 ENCOUNTER — Encounter: Payer: Self-pay | Admitting: Internal Medicine

## 2019-06-09 DIAGNOSIS — E785 Hyperlipidemia, unspecified: Secondary | ICD-10-CM | POA: Diagnosis not present

## 2019-06-09 DIAGNOSIS — Z0001 Encounter for general adult medical examination with abnormal findings: Secondary | ICD-10-CM | POA: Diagnosis not present

## 2019-06-09 DIAGNOSIS — E559 Vitamin D deficiency, unspecified: Secondary | ICD-10-CM | POA: Diagnosis not present

## 2019-06-09 DIAGNOSIS — Z20828 Contact with and (suspected) exposure to other viral communicable diseases: Secondary | ICD-10-CM | POA: Diagnosis not present

## 2019-06-09 DIAGNOSIS — R7309 Other abnormal glucose: Secondary | ICD-10-CM | POA: Diagnosis not present

## 2019-06-09 DIAGNOSIS — I10 Essential (primary) hypertension: Secondary | ICD-10-CM | POA: Diagnosis not present

## 2019-06-09 DIAGNOSIS — M25519 Pain in unspecified shoulder: Secondary | ICD-10-CM | POA: Diagnosis not present

## 2019-06-09 DIAGNOSIS — C3491 Malignant neoplasm of unspecified part of right bronchus or lung: Secondary | ICD-10-CM | POA: Diagnosis not present

## 2019-06-10 ENCOUNTER — Telehealth: Payer: Self-pay | Admitting: Medical Oncology

## 2019-06-10 NOTE — Telephone Encounter (Signed)
VM to fax recent labs to Dr Julien Nordmann.  We will not repeat them on the 28th if we have a cbc/diff and cmet from Dr [name]

## 2019-06-11 ENCOUNTER — Encounter: Payer: Self-pay | Admitting: Medical Oncology

## 2019-06-11 NOTE — Telephone Encounter (Signed)
Labs received from Dr [name].

## 2019-06-12 ENCOUNTER — Emergency Department (HOSPITAL_COMMUNITY): Payer: Medicare HMO

## 2019-06-12 ENCOUNTER — Other Ambulatory Visit: Payer: Self-pay

## 2019-06-12 ENCOUNTER — Telehealth: Payer: Self-pay | Admitting: Medical Oncology

## 2019-06-12 ENCOUNTER — Encounter (HOSPITAL_COMMUNITY): Payer: Self-pay

## 2019-06-12 ENCOUNTER — Encounter: Payer: Self-pay | Admitting: Internal Medicine

## 2019-06-12 ENCOUNTER — Inpatient Hospital Stay (HOSPITAL_COMMUNITY)
Admission: EM | Admit: 2019-06-12 | Discharge: 2019-06-17 | DRG: 247 | Disposition: A | Discharge status: Home / Self Care | Payer: Medicare HMO | Attending: Internal Medicine | Admitting: Internal Medicine

## 2019-06-12 DIAGNOSIS — I361 Nonrheumatic tricuspid (valve) insufficiency: Secondary | ICD-10-CM | POA: Diagnosis not present

## 2019-06-12 DIAGNOSIS — I10 Essential (primary) hypertension: Secondary | ICD-10-CM | POA: Diagnosis present

## 2019-06-12 DIAGNOSIS — Z8249 Family history of ischemic heart disease and other diseases of the circulatory system: Secondary | ICD-10-CM

## 2019-06-12 DIAGNOSIS — I340 Nonrheumatic mitral (valve) insufficiency: Secondary | ICD-10-CM | POA: Diagnosis not present

## 2019-06-12 DIAGNOSIS — R040 Epistaxis: Secondary | ICD-10-CM | POA: Diagnosis not present

## 2019-06-12 DIAGNOSIS — Z7982 Long term (current) use of aspirin: Secondary | ICD-10-CM | POA: Diagnosis not present

## 2019-06-12 DIAGNOSIS — Z79899 Other long term (current) drug therapy: Secondary | ICD-10-CM | POA: Diagnosis not present

## 2019-06-12 DIAGNOSIS — D649 Anemia, unspecified: Secondary | ICD-10-CM | POA: Diagnosis not present

## 2019-06-12 DIAGNOSIS — H409 Unspecified glaucoma: Secondary | ICD-10-CM | POA: Diagnosis present

## 2019-06-12 DIAGNOSIS — R079 Chest pain, unspecified: Secondary | ICD-10-CM | POA: Diagnosis not present

## 2019-06-12 DIAGNOSIS — E559 Vitamin D deficiency, unspecified: Secondary | ICD-10-CM | POA: Diagnosis not present

## 2019-06-12 DIAGNOSIS — E876 Hypokalemia: Secondary | ICD-10-CM | POA: Diagnosis not present

## 2019-06-12 DIAGNOSIS — I25110 Atherosclerotic heart disease of native coronary artery with unstable angina pectoris: Secondary | ICD-10-CM | POA: Diagnosis not present

## 2019-06-12 DIAGNOSIS — Z955 Presence of coronary angioplasty implant and graft: Secondary | ICD-10-CM | POA: Diagnosis not present

## 2019-06-12 DIAGNOSIS — I252 Old myocardial infarction: Secondary | ICD-10-CM | POA: Diagnosis present

## 2019-06-12 DIAGNOSIS — R001 Bradycardia, unspecified: Secondary | ICD-10-CM

## 2019-06-12 DIAGNOSIS — I214 Non-ST elevation (NSTEMI) myocardial infarction: Secondary | ICD-10-CM | POA: Diagnosis not present

## 2019-06-12 DIAGNOSIS — Z9221 Personal history of antineoplastic chemotherapy: Secondary | ICD-10-CM | POA: Diagnosis not present

## 2019-06-12 DIAGNOSIS — C3490 Malignant neoplasm of unspecified part of unspecified bronchus or lung: Secondary | ICD-10-CM

## 2019-06-12 DIAGNOSIS — Z90710 Acquired absence of both cervix and uterus: Secondary | ICD-10-CM | POA: Diagnosis not present

## 2019-06-12 DIAGNOSIS — E785 Hyperlipidemia, unspecified: Secondary | ICD-10-CM | POA: Diagnosis present

## 2019-06-12 DIAGNOSIS — I2510 Atherosclerotic heart disease of native coronary artery without angina pectoris: Secondary | ICD-10-CM | POA: Diagnosis not present

## 2019-06-12 DIAGNOSIS — Z801 Family history of malignant neoplasm of trachea, bronchus and lung: Secondary | ICD-10-CM

## 2019-06-12 DIAGNOSIS — Z20828 Contact with and (suspected) exposure to other viral communicable diseases: Secondary | ICD-10-CM | POA: Diagnosis not present

## 2019-06-12 DIAGNOSIS — R0602 Shortness of breath: Secondary | ICD-10-CM | POA: Diagnosis not present

## 2019-06-12 LAB — HEPATIC FUNCTION PANEL
ALT: 25 U/L (ref 0–44)
AST: 43 U/L — ABNORMAL HIGH (ref 15–41)
Albumin: 3.9 g/dL (ref 3.5–5.0)
Alkaline Phosphatase: 56 U/L (ref 38–126)
Bilirubin, Direct: <0.1 mg/dL (ref 0.0–0.2)
Total Bilirubin: 0.2 mg/dL — ABNORMAL LOW (ref 0.3–1.2)
Total Protein: 7.1 g/dL (ref 6.5–8.1)

## 2019-06-12 LAB — TROPONIN I (HIGH SENSITIVITY)
Troponin I (High Sensitivity): 2355 ng/L (ref <18)
Troponin I (High Sensitivity): 3526 ng/L (ref <18)

## 2019-06-12 LAB — CBC
HCT: 37.2 % (ref 36.0–46.0)
Hemoglobin: 11.7 g/dL — ABNORMAL LOW (ref 12.0–15.0)
MCH: 26.8 pg (ref 26.0–34.0)
MCHC: 31.5 g/dL (ref 30.0–36.0)
MCV: 85.3 fL (ref 80.0–100.0)
Platelets: 203 10*3/uL (ref 150–400)
RBC: 4.36 MIL/uL (ref 3.87–5.11)
RDW: 14.4 % (ref 11.5–15.5)
WBC: 4.8 10*3/uL (ref 4.0–10.5)
nRBC: 0 % (ref 0.0–0.2)

## 2019-06-12 LAB — BASIC METABOLIC PANEL
Anion gap: 9 (ref 5–15)
BUN: 19 mg/dL (ref 8–23)
CO2: 25 mmol/L (ref 22–32)
Calcium: 9.3 mg/dL (ref 8.9–10.3)
Chloride: 103 mmol/L (ref 98–111)
Creatinine, Ser: 1.03 mg/dL — ABNORMAL HIGH (ref 0.44–1.00)
GFR calc Af Amer: >60 mL/min (ref >60)
GFR calc non Af Amer: 56 mL/min — ABNORMAL LOW (ref >60)
Glucose, Bld: 107 mg/dL — ABNORMAL HIGH (ref 70–99)
Potassium: 3.6 mmol/L (ref 3.5–5.1)
Sodium: 137 mmol/L (ref 135–145)

## 2019-06-12 LAB — RESPIRATORY PANEL BY RT PCR (FLU A&B, COVID)
Influenza A by PCR: NEGATIVE
Influenza B by PCR: NEGATIVE
SARS Coronavirus 2 by RT PCR: NEGATIVE

## 2019-06-12 MED ORDER — ATORVASTATIN CALCIUM 80 MG PO TABS
80.0000 mg | ORAL_TABLET | Freq: Every day | ORAL | Status: DC
  Administered 2019-06-12 – 2019-06-16 (×5): 80 mg via ORAL
  Filled 2019-06-12 (×5): qty 1

## 2019-06-12 MED ORDER — SODIUM CHLORIDE 0.9% FLUSH: 3.0000 mL | Freq: Once | INTRAVENOUS | Status: DC

## 2019-06-12 MED ORDER — HEPARIN BOLUS VIA INFUSION
3000.0000 [IU] | Freq: Once | INTRAVENOUS | Status: AC
  Administered 2019-06-12: 3000 [IU] via INTRAVENOUS
  Filled 2019-06-12: qty 3000

## 2019-06-12 MED ORDER — ASPIRIN 81 MG PO CHEW
324.0000 mg | CHEWABLE_TABLET | Freq: Once | ORAL | Status: AC
  Administered 2019-06-12: 324 mg via ORAL
  Filled 2019-06-12: qty 4

## 2019-06-12 MED ORDER — ASPIRIN 81 MG PO CHEW
81.0000 mg | CHEWABLE_TABLET | Freq: Every day | ORAL | Status: DC
  Administered 2019-06-13 – 2019-06-15 (×3): 81 mg via ORAL
  Filled 2019-06-12 (×3): qty 1

## 2019-06-12 MED ORDER — HEPARIN (PORCINE) 25000 UT/250ML-% IV SOLN
750.0000 [IU]/h | INTRAVENOUS | Status: DC
  Administered 2019-06-12: 700 [IU]/h via INTRAVENOUS
  Administered 2019-06-15: 750 [IU]/h via INTRAVENOUS
  Filled 2019-06-12 (×3): qty 250

## 2019-06-12 MED ORDER — IOHEXOL 350 MG/ML SOLN
100.0000 mL | Freq: Once | INTRAVENOUS | Status: AC | PRN
  Administered 2019-06-12: 100 mL via INTRAVENOUS

## 2019-06-12 MED ORDER — NITROGLYCERIN 0.4 MG SL SUBL
0.4000 mg | SUBLINGUAL_TABLET | Freq: Once | SUBLINGUAL | Status: AC
  Administered 2019-06-12: 0.4 mg via SUBLINGUAL
  Filled 2019-06-12: qty 1

## 2019-06-12 NOTE — ED Provider Notes (Signed)
[facility] DEPT Provider Note   CSN: [id number] Arrival date & time: 06/12/19  1528     History Chief Complaint  Patient presents with  . Chest Pain    Allison [name] is a 68 y.o. female.  HPI Patient has a history of stage IV lung cancer.  Over the last week has had worsening chest pain.  It is somewhat sharp in her anterior lower chest and does go to the back.  States she also has pain in her jaw and shoulders of the time.  Comes and goes.  States is worse when she goes outside.  May be worse with exertion also.  May have had some shortness of breath.  States she saw her PCP around 3 weeks ago and told it was muscle tightness from the cold air and was started on magnesium.  States is gotten worse.  States she really does not have pain from the cancer although she did previously have pain with her Pleurx catheter.  Has no known cardiac disease but states that her 3 siblings all of had heart attacks.  No cough.  No fevers.  No nausea vomiting diarrhea.  States her heart is been going slow to.  States is gone down into the 40s.    Past Medical History:  Diagnosis Date  . Anxiety   . Arthritis   . Asthma    exercise induced  . Depression    PMH  . GERD (gastroesophageal reflux disease)   . Glaucoma   . Hypertension   . lung ca dx'd 11/2017   right  . Malignant pleural effusion    right  . PONV (postoperative nausea and vomiting)   . Pre-diabetes   . Raynaud's disease   . Raynaud's disease     Patient Active Problem List   Diagnosis Date Noted  . Hypertension 03/24/2019  . Encounter for antineoplastic chemotherapy 01/22/2018  . Adenocarcinoma of right lung, stage 4 ([place]) 01/04/2018  . Goals of care, counseling/discussion 01/04/2018  . Thyroid nodule 12/11/2017  . Pulmonary nodules 12/11/2017  . Pleural effusion, right 12/11/2017  . Mass of upper lobe of right lung 12/10/2016    Past Surgical History:  Procedure Laterality Date  .  ABDOMINAL HYSTERECTOMY     partial  . CHEST TUBE INSERTION Right 01/01/2018   Procedure: INSERTION PLEURAL DRAINAGE CATHETER;  Surgeon: Ivin Poot, MD;  Location: [facility];  Service: Thoracic;  Laterality: Right;  . COLONOSCOPY    . DILATION AND CURETTAGE OF UTERUS    . EYE SURGERY     due to Glaucoma  . REMOVAL OF PLEURAL DRAINAGE CATHETER Right 11/07/2018   Procedure: REMOVAL OF PLEURAL DRAINAGE CATHETER;  Surgeon: Ivin Poot, MD;  Location: [facility];  Service: Thoracic;  Laterality: Right;  . ROTATOR CUFF REPAIR    . TUBAL LIGATION    . WISDOM TOOTH EXTRACTION       OB History   No obstetric history on file.     Family History  Problem Relation Age of Onset  . Heart disease Sister   . Heart disease Brother   . Lung cancer Other     Social History   Tobacco Use  . Smoking status: Never Smoker  . Smokeless tobacco: Never Used  Substance Use Topics  . Alcohol use: Yes    Comment: up to 3 drinks per week  . Drug use: No    Comment: CBD oil     Home Medications Prior  to Admission medications   Medication Sig Start Date End Date Taking? Authorizing Provider  acetaminophen (TYLENOL) 500 MG tablet Take 1,000 mg by mouth every 6 (six) hours as needed (pain).    Yes [provider]  ALPRAZolam (XANAX) 0.25 MG tablet Take 0.25 mg by mouth 2 (two) times daily as needed for anxiety.    Yes [provider]  amLODipine (NORVASC) 5 MG tablet Take 5 mg by mouth daily. 12/11/17  Yes [provider]  Biotin (HM BIOTIN) 5 MG CAPS Take 5,000 mg by mouth daily.   Yes [provider]  dorzolamide-timolol (COSOPT) 22.3-6.8 MG/ML ophthalmic solution Instill 1 drop in both eyes twice daily   Yes [provider]  ezetimibe (ZETIA) 10 MG tablet Take 10 mg by mouth daily.    Yes [provider]  hydrochlorothiazide (HYDRODIURIL) 25 MG tablet Take 25 mg by mouth daily. 11/02/17  Yes [provider]  latanoprost (XALATAN) 0.005 %  ophthalmic solution Place 1 drop into both eyes at bedtime.  12/14/17  Yes [provider]  loperamide (IMODIUM A-D) 2 MG tablet Take 2 mg by mouth 4 (four) times daily as needed for diarrhea or loose stools.   Yes [provider]  Melatonin 5 MG CAPS Take 10 mg by mouth at bedtime as needed (sleep).   Yes [provider]  osimertinib mesylate (TAGRISSO) 80 MG tablet Take 1 tablet (80 mg total) by mouth daily. Refill Faxed to [facility]11/24/2020 05/13/19  Yes Curt Bears, MD  VITAMIN D, CHOLECALCIFEROL, PO Take 5,000 Units by mouth daily.    Yes [provider]    Allergies    Penicillins and Vicodin [hydrocodone-acetaminophen]  Review of Systems   Review of Systems  Constitutional: Negative for appetite change.  HENT: Negative for congestion.   Respiratory: Negative for cough.   Cardiovascular: Positive for chest pain. Negative for leg swelling.  Gastrointestinal: Negative for abdominal pain.  Genitourinary: Negative for flank pain.  Musculoskeletal: Positive for back pain.  Skin: Negative for rash.  Neurological: Negative for weakness.  Psychiatric/Behavioral: Negative for confusion.    Physical Exam Updated Vital Signs BP 123/66   Pulse (!) 47   Temp 98.2 F (36.8 C) (Oral)   Resp 14   Ht 5' 4.5" (1.638 m)   Wt 58.5 kg   LMP  (LMP Unknown)   SpO2 100%   BMI 21.80 kg/m   Physical Exam  ED Results / Procedures / Treatments   Labs (all labs ordered are listed, but only abnormal results are displayed) Labs Reviewed  BASIC METABOLIC PANEL - Abnormal; Notable for the following components:      Result Value   Glucose, Bld 107 (*)    Creatinine, Ser 1.03 (*)    GFR calc non Af Amer 56 (*)    All other components within normal limits  CBC - Abnormal; Notable for the following components:   Hemoglobin 11.7 (*)    All other components within normal limits  HEPATIC FUNCTION PANEL - Abnormal; Notable for the following components:   AST  43 (*)    Total Bilirubin 0.2 (*)    All other components within normal limits  TROPONIN I (HIGH SENSITIVITY) - Abnormal; Notable for the following components:   Troponin I (High Sensitivity) 2,355 (*)    All other components within normal limits  RESPIRATORY PANEL BY RT PCR (FLU A&B, COVID)  HEPARIN LEVEL (UNFRACTIONATED)  CBC  TROPONIN I (HIGH SENSITIVITY)    EKG EKG Interpretation  Date/Time:  Thursday June 12 2019 15:38:42 EST Ventricular Rate:  48 PR Interval:    QRS Duration: 92 QT Interval:  474 QTC Calculation: 424 R Axis:   65 Text Interpretation: Sinus bradycardia Confirmed by Davonna Belling [phone]) on 06/12/2019 4:36:42 PM   Radiology DG Chest 2 View  Result Date: 06/12/2019 CLINICAL DATA:  Chest pain for 1 week, weakness, history hypertension, RIGHT-side lung cancer, Raynaud disease EXAM: CHEST - 2 VIEW COMPARISON:  04/09/2019 FINDINGS: Normal heart size, mediastinal contours, and pulmonary vascularity. RIGHT apical nodule 22 mm long axis, previously 20 mm. Remaining lungs clear. No acute infiltrate, pleural effusion or pneumothorax. Bones demineralized. IMPRESSION: Persistent RIGHT apical nodule question minimally larger versus projectional difference. No acute abnormalities. Electronically Signed   By: Lavonia Dana M.D.   On: 06/12/2019 16:32   CT Angio Chest PE W and/or Wo Contrast  Result Date: 06/12/2019 CLINICAL DATA:  68 year old female with shortness of breath. History of lung cancer. EXAM: CT ANGIOGRAPHY CHEST WITH CONTRAST TECHNIQUE: Multidetector CT imaging of the chest was performed using the standard protocol during bolus administration of intravenous contrast. Multiplanar CT image reconstructions and MIPs were obtained to evaluate the vascular anatomy. CONTRAST:  134mL OMNIPAQUE IOHEXOL 350 MG/ML SOLN COMPARISON:  Chest radiograph dated 06/12/2019 and CT dated 05/02/2019. FINDINGS: Cardiovascular: There is no cardiomegaly or pericardial effusion. Mild  atherosclerotic calcification of the thoracic aorta. No aneurysmal dilatation. Apparent focal filling defect within the right upper lobe pulmonary artery branch (series 5, image 101 and coronal series 7, image 61) is likely artifactual. A nonocclusive embolus is less likely but not excluded. No other acute pulmonary artery embolus identified. Mediastinum/Nodes: There is no hilar or mediastinal adenopathy. The esophagus and the thyroid gland are grossly unremarkable as visualized. No mediastinal fluid collection. Lungs/Pleura: There is a small right pleural effusion similar to prior CT. Diffuse interstitial prominence may represent mild edema. There is a 2.5 x 1.7 cm nodule with irregular and spiculated margins in the right apex which is similar to prior CT. A 1 cm nodular density in the right lower lobe along the major fissure (series 6, image 79) appears similar to prior CT. Overall no interval change in the appearance of the lungs since the prior CT. There is no pneumothorax. The central airways are patent. Upper Abdomen: No acute abnormality. Musculoskeletal: No chest wall abnormality. No acute or significant osseous findings. Review of the MIP images confirms the above findings. IMPRESSION: 1. Artifact versus less likely nonocclusive thrombus in the right upper lobe pulmonary artery branch. 2. Small right pleural effusion similar to prior CT. 3. Right apical and right lower lobe nodular densities as seen on the prior CT. Overall no interval change in the appearance of the lungs since the prior CT. 4. Aortic Atherosclerosis (ICD10-I70.0). Electronically Signed   By: Anner Crete M.D.   On: 06/12/2019 19:05    Procedures Procedures (including critical care time)  Medications Ordered in ED Medications  sodium chloride flush (NS) 0.9 % injection 3 mL ( Intravenous Canceled Entry 06/12/19 1558)  heparin ADULT infusion 100 units/mL (25000 units/277mL sodium chloride 0.45%) (700 Units/hr Intravenous New  Bag/Given 06/12/19 1817)  aspirin chewable tablet 324 mg (324 mg Oral Given 06/12/19 1728)  nitroGLYCERIN (NITROSTAT) SL tablet 0.4 mg (0.4 mg Sublingual Given 06/12/19 1742)  heparin bolus via infusion 3,000 Units (3,000 Units Intravenous Bolus from Bag 06/12/19 1818)  iohexol (OMNIPAQUE) 350 MG/ML injection 100 mL (100 mLs Intravenous Contrast Given 06/12/19 1843)    ED  Course  I have reviewed the triage vital signs and the nursing notes.  Pertinent labs & imaging results that were available during my care of the patient were reviewed by me and considered in my medical decision making (see chart for details).    MDM Rules/Calculators/A&P                     Patient presents with chest pain over the last few weeks.  Has been seen by PCP who thought that may be musculoskeletal.  EKG shows a bradycardia but otherwise reassuring.  Has known stage IV lung cancer is on some treatment.  Pain gets worse with exertion however.  First troponin elevated at 2300.  Started on heparin and aspirin.  CT scan done due to known lung cancer to evaluate if this could be secondary to other abnormality such as effusion or pulmonary embolism.  Seen by cardiology.  Recommends admission to the hospital at [facility].  Will admit to internal medicine due to comorbidities.  CT scan done and showed artifact versus small nonocclusive PE.  I doubt a small nonocclusive PE even if it was not artifact could be the cause of a troponin of 2300.  Will admit.  CRITICAL CARE Performed by: Davonna Belling Total critical care time: 30 minutes Critical care time was exclusive of separately billable procedures and treating other patients. Critical care was necessary to treat or prevent imminent or life-threatening deterioration. Critical care was time spent personally by me on the following activities: development of treatment plan with patient and/or surrogate as well as nursing, discussions with consultants, evaluation of  patient's response to treatment, examination of patient, obtaining history from patient or surrogate, ordering and performing treatments and interventions, ordering and review of laboratory studies, ordering and review of radiographic studies, pulse oximetry and re-evaluation of patient's condition.  Final Clinical Impression(s) / ED Diagnoses Final diagnoses:  NSTEMI (non-ST elevated myocardial infarction) [facility])  Stage 4 malignant neoplasm of lung, unspecified laterality [facility])    Rx / DC Orders ED Discharge Orders    None       Davonna Belling, MD 06/12/19 1919

## 2019-06-12 NOTE — ED Notes (Signed)
Dr Alvino Chapel at bedside

## 2019-06-12 NOTE — Consult Note (Signed)
Cardiology Consultation:   Patient ID: Allison [name] MRN: [id number]; DOB: 1951/04/18  Admit date: 06/12/2019 Date of Consult: 06/12/2019  Primary Care Provider: [name], MD Primary Cardiologist: No primary care provider on file.  Primary Electrophysiologist:  None    Patient Profile:   Allison [name] is a 68 y.o. female with a hx of stage IV lung cancer, HTN, HLD  who is being seen today for the evaluation of NSTEMI at the request of Dr Alvino Chapel  History of Present Illness:   Allison [name] is a 68 y.o. female with a hx of stage IV lung cancer, HTN, HLD who presents with chest pain.  She reports that 1 week ago she began having chest pain.  Describes as sharp stabbing pain that occurs in center of chest and radiates to the jaw and shoulders.  States that it occurs with exertion, would resolve with rest after about 15 minutes.  This morning however she woke up with this sharp pain that she had been having but also was having a tightness in the center of her chest.  States the pain this morning was much more intense than it had been.  States the pain lasted about 30 minutes, from 9 AM to 930.  She also checked her pulse during that time and noted that it was in the 40s, which is unusual for her as she usually runs in the 50s to 60s.  Given her symptoms, she came to the ED.  EKG with no ST changes.  Labs notable for initial troponin 2355.  Hemoglobin 11.7, platelets 203, creatinine 1.0, Covid negative.  CTPA shows likely artifact versus less likely nonocclusive thrombus in the right upper lobe pulmonary artery branch.  She reports she is currently chest pain-free  She has stage IV non-small cell lung cancer, diagnosed in 12/2017.  She is currently on Tagrisso 80 mg daily.  Heart Pathway Score:     Past Medical History:  Diagnosis Date  . Anxiety   . Arthritis   . Asthma    exercise induced  . Depression    PMH  . GERD (gastroesophageal reflux disease)   . Glaucoma   .  Hypertension   . lung ca dx'd 11/2017   right  . Malignant pleural effusion    right  . PONV (postoperative nausea and vomiting)   . Pre-diabetes   . Raynaud's disease   . Raynaud's disease     Past Surgical History:  Procedure Laterality Date  . ABDOMINAL HYSTERECTOMY     partial  . CHEST TUBE INSERTION Right 01/01/2018   Procedure: INSERTION PLEURAL DRAINAGE CATHETER;  Surgeon: Ivin Poot, MD;  Location: [facility];  Service: Thoracic;  Laterality: Right;  . COLONOSCOPY    . DILATION AND CURETTAGE OF UTERUS    . EYE SURGERY     due to Glaucoma  . REMOVAL OF PLEURAL DRAINAGE CATHETER Right 11/07/2018   Procedure: REMOVAL OF PLEURAL DRAINAGE CATHETER;  Surgeon: Ivin Poot, MD;  Location: [facility];  Service: Thoracic;  Laterality: Right;  . ROTATOR CUFF REPAIR    . TUBAL LIGATION    . WISDOM TOOTH EXTRACTION        Inpatient Medications: Scheduled Meds: . [START ON 06/13/2019] aspirin  81 mg Oral Daily  . atorvastatin  80 mg Oral q1800  . sodium chloride flush  3 mL Intravenous Once   Continuous Infusions: . heparin 700 Units/hr (06/12/19 1817)   PRN Meds:   Allergies:    Allergies  Allergen Reactions  . Penicillins Other (See Comments)    SYNCOPE PATIENT HAS HAD A PCN REACTION WITH IMMEDIATE RASH, FACIAL/TONGUE/THROAT SWELLING, SOB, OR LIGHTHEADEDNESS WITH HYPOTENSION:  #  #  YES  #  # Has patient had a PCN reaction causing severe rash involving mucus membranes or skin necrosis: No Has patient had a PCN reaction that required hospitalization: No Has patient had a PCN reaction occurring within the last 10 years: No   . Vicodin [Hydrocodone-Acetaminophen] Other (See Comments)    Sped up heart and breathing    Social History:   Social History   Socioeconomic History  . Marital status: Widowed    Spouse name: Not on file  . Number of children: 3  . Years of education: Not on file  . Highest education level: Not on file  Occupational History    Employer:  [employer]  Tobacco Use  . Smoking status: Never Smoker  . Smokeless tobacco: Never Used  Substance and Sexual Activity  . Alcohol use: Yes    Comment: up to 3 drinks per week  . Drug use: No    Comment: CBD oil   . Sexual activity: Not on file    Comment: Hysterectomy  Other Topics Concern  . Not on file  Social History Narrative  . Not on file   Social Determinants of Health   Financial Resource Strain:   . Difficulty of Paying Living Expenses: Not on file  Food Insecurity:   . Worried About Charity fundraiser in the Last Year: Not on file  . Ran Out of Food in the Last Year: Not on file  Transportation Needs:   . Lack of Transportation (Medical): Not on file  . Lack of Transportation (Non-Medical): Not on file  Physical Activity:   . Days of Exercise per Week: Not on file  . Minutes of Exercise per Session: Not on file  Stress:   . Feeling of Stress : Not on file  Social Connections:   . Frequency of Communication with Friends and Family: Not on file  . Frequency of Social Gatherings with Friends and Family: Not on file  . Attends Religious Services: Not on file  . Active Member of Clubs or Organizations: Not on file  . Attends Archivist Meetings: Not on file  . Marital Status: Not on file  Intimate Partner Violence:   . Fear of Current or Ex-Partner: Not on file  . Emotionally Abused: Not on file  . Physically Abused: Not on file  . Sexually Abused: Not on file    Family History:    Family History  Problem Relation Age of Onset  . Heart disease Sister   . Heart disease Brother   . Lung cancer Other      ROS:  Please see the history of present illness.   All other ROS reviewed and negative.     Physical Exam/Data:   Vitals:   06/12/19 1800 06/12/19 1830 06/12/19 1900 06/12/19 2040  BP: 111/68 115/62 123/66 127/74  Pulse: (!) 47 (!) 54 (!) 47 (!) 57  Resp: 16 15 14 18   Temp:      TempSrc:      SpO2: 100% 100% 100% 100%  Weight:       Height:       No intake or output data in the 24 hours ending 06/12/19 2046 Last 3 Weights 06/12/2019 05/05/2019 04/09/2019  Weight (lbs) 129 lb 131 lb 4.8 oz 129 lb  Weight (kg) 58.514 kg 59.557 kg 58.514 kg     Body mass index is 21.8 kg/m.  General:  Well nourished, well developed, in no acute distress HEENT: normal Lymph: no adenopathy Neck: no JVD Endocrine:  No thryomegaly Vascular: No carotid bruits Cardiac:  normal S1, S2; RRR, 2/6 systolic murmur Lungs:  clear to auscultation bilaterally, no wheezing, rhonchi or rales  Abd: soft, nontender, no hepatomegaly  Ext: no edema Musculoskeletal:  No deformities, BUE and BLE strength normal and equal Skin: warm and dry  Neuro:  CNs 2-12 intact, no focal abnormalities noted Psych:  Normal affect   EKG:  The EKG was personally reviewed and demonstrates:  Sinus bradycardia, rate 56, nonspecific T wave flattening  Relevant CV Studies: CTPA: 1. Artifact versus less likely nonocclusive thrombus in the right upper lobe pulmonary artery branch. 2. Small right pleural effusion similar to prior CT. 3. Right apical and right lower lobe nodular densities as seen on the prior CT. Overall no interval change in the appearance of the lungs since the prior CT. 4. Aortic Atherosclerosis (ICD10-I70.0).  Laboratory Data:  High Sensitivity Troponin:   Recent Labs  Lab 06/12/19 1612 06/12/19 1819  TROPONINIHS 2,355* 3,526*     Chemistry Recent Labs  Lab 06/12/19 1612  NA 137  K 3.6  CL 103  CO2 25  GLUCOSE 107*  BUN 19  CREATININE 1.03*  CALCIUM 9.3  GFRNONAA 56*  GFRAA >60  ANIONGAP 9    Recent Labs  Lab 06/12/19 1612  PROT 7.1  ALBUMIN 3.9  AST 43*  ALT 25  ALKPHOS 56  BILITOT 0.2*   Hematology Recent Labs  Lab 06/12/19 1612  WBC 4.8  RBC 4.36  HGB 11.7*  HCT 37.2  MCV 85.3  MCH 26.8  MCHC 31.5  RDW 14.4  PLT 203   BNPNo results for input(s): BNP, PROBNP in the last 168 hours.  DDimer No results for  input(s): DDIMER in the last 168 hours.   Radiology/Studies:  DG Chest 2 View  Result Date: 06/12/2019 CLINICAL DATA:  Chest pain for 1 week, weakness, history hypertension, RIGHT-side lung cancer, Raynaud disease EXAM: CHEST - 2 VIEW COMPARISON:  04/09/2019 FINDINGS: Normal heart size, mediastinal contours, and pulmonary vascularity. RIGHT apical nodule 22 mm long axis, previously 20 mm. Remaining lungs clear. No acute infiltrate, pleural effusion or pneumothorax. Bones demineralized. IMPRESSION: Persistent RIGHT apical nodule question minimally larger versus projectional difference. No acute abnormalities. Electronically Signed   By: Lavonia Dana M.D.   On: 06/12/2019 16:32   CT Angio Chest PE W and/or Wo Contrast  Result Date: 06/12/2019 CLINICAL DATA:  68 year old female with shortness of breath. History of lung cancer. EXAM: CT ANGIOGRAPHY CHEST WITH CONTRAST TECHNIQUE: Multidetector CT imaging of the chest was performed using the standard protocol during bolus administration of intravenous contrast. Multiplanar CT image reconstructions and MIPs were obtained to evaluate the vascular anatomy. CONTRAST:  15mL OMNIPAQUE IOHEXOL 350 MG/ML SOLN COMPARISON:  Chest radiograph dated 06/12/2019 and CT dated 05/02/2019. FINDINGS: Cardiovascular: There is no cardiomegaly or pericardial effusion. Mild atherosclerotic calcification of the thoracic aorta. No aneurysmal dilatation. Apparent focal filling defect within the right upper lobe pulmonary artery branch (series 5, image 101 and coronal series 7, image 61) is likely artifactual. A nonocclusive embolus is less likely but not excluded. No other acute pulmonary artery embolus identified. Mediastinum/Nodes: There is no hilar or mediastinal adenopathy. The esophagus and the thyroid gland are grossly unremarkable as visualized. No mediastinal fluid  collection. Lungs/Pleura: There is a small right pleural effusion similar to prior CT. Diffuse interstitial  prominence may represent mild edema. There is a 2.5 x 1.7 cm nodule with irregular and spiculated margins in the right apex which is similar to prior CT. A 1 cm nodular density in the right lower lobe along the major fissure (series 6, image 79) appears similar to prior CT. Overall no interval change in the appearance of the lungs since the prior CT. There is no pneumothorax. The central airways are patent. Upper Abdomen: No acute abnormality. Musculoskeletal: No chest wall abnormality. No acute or significant osseous findings. Review of the MIP images confirms the above findings. IMPRESSION: 1. Artifact versus less likely nonocclusive thrombus in the right upper lobe pulmonary artery branch. 2. Small right pleural effusion similar to prior CT. 3. Right apical and right lower lobe nodular densities as seen on the prior CT. Overall no interval change in the appearance of the lungs since the prior CT. 4. Aortic Atherosclerosis (ICD10-I70.0). Electronically Signed   By: Anner Crete M.D.   On: 06/12/2019 19:05       TIMI Risk Score for Unstable Angina or Non-ST Elevation MI:   The patient's TIMI risk score is 4, which indicates a 20% risk of all cause mortality, new or recurrent myocardial infarction or need for urgent revascularization in the next 14 days.   Assessment and Plan:   NSTEMI: reported 1 week history of exertional angina and presented today following episode of resting chest pain.  High sensitivity troponin 2355->3526.  EKG with no ST changes.  Currently chest pain-free - Heparin gtt  - ASA 81 mg daily - Atorvastatin 80 mg daily.  Will check lipid panel - No BB given bradycardia - NTG as needed for chest pain - TTE - Likely medical management, patient is unsure whether she would want to pursue heart catheterization given stage IV lung cancer  Sinus bradycardia: Reports resting heart rate 50s to 60s, was in 40s on presentation.  Likely related to ischemia as above.    HTN: on  amlodipine 5mg  daily and HCTZ 25 mg daily - Normotensive, can fold in home meds as needed  HLD: on zetia, but reports that she stopped taking recently as thought it was affecting her cognition.  Start atorvastatin as above  Stage IV lung cancer: reports no spread outside chest but has a malignant pleural effusion.  On Tagrisso 80 mg daily.  For questions or updates, please contact [place] Please consult [email or web address] for contact info under     Signed, Donato Heinz, MD  06/12/2019 8:46 PM

## 2019-06-12 NOTE — Telephone Encounter (Signed)
She asked what to do and what ED should she go to ?  New chest pressure / her arm and back hurts.  She is SOB on the phone. She sounds anxious and near tears on the phone.  I called pt back and told her to call 911 now . I also told her to call her family . She said she make both calls.

## 2019-06-12 NOTE — ED Notes (Signed)
Date and time results received: 06/12/19 2017  Test: Troponin  Critical Value: 3,526  Name of Provider Notified: Dr. Delane Ginger. [place]  Orders Received? Or Actions Taken?: Continue to monitor patient and await for new orders.

## 2019-06-12 NOTE — Progress Notes (Signed)
[place] for heparin Indication: chest pain/ACS  Allergies  Allergen Reactions  . Penicillins Other (See Comments)    [facility] PATIENT HAS HAD A PCN REACTION WITH IMMEDIATE RASH, FACIAL/TONGUE/THROAT SWELLING, SOB, OR LIGHTHEADEDNESS WITH HYPOTENSION:  #  #  YES  #  # Has patient had a PCN reaction causing severe rash involving mucus membranes or skin necrosis: No Has patient had a PCN reaction that required hospitalization: No Has patient had a PCN reaction occurring within the last 10 years: No   . Vicodin [Hydrocodone-Acetaminophen] Other (See Comments)    Sped up heart and breathing    Patient Measurements: Height: 5' 4.5" (163.8 cm) Weight: 129 lb (58.5 kg) IBW/kg (Calculated) : 55.85 Heparin Dosing Weight: 58 kg  Vital Signs: Temp: 98.2 F (36.8 C) (12/24 1537) Temp Source: Oral (12/24 1537) BP: 121/70 (12/24 1730) Pulse Rate: 61 (12/24 1730)  Labs: Recent Labs    06/12/19 1612  HGB 11.7*  HCT 37.2  PLT 203  CREATININE 1.03*  TROPONINIHS 2,355*    Estimated Creatinine Clearance: 46.1 mL/min (A) (by C-G formula based on SCr of 1.03 mg/dL (H)).   Assessment: Patient's a 68 y.o F presented to the ED on 12/24 with c/o CP.  She was found to have elevated cardiac enzymes.  Pharmacy is consulted to dose heparin for suspected ACS.   Goal of Therapy:  Heparin level 0.3-0.7 units/ml Monitor platelets by anticoagulation protocol: Yes   Plan:  - heparin 3000 units IV x1, then drip at 700 units/hr - check 6 hr heparin level - monitor for s/s bleeding  Jerzy Crotteau P 06/12/2019,5:45 PM

## 2019-06-12 NOTE — ED Notes (Signed)
Pt was provided ham & [place] sandwich, cola, water, graham crackers.

## 2019-06-12 NOTE — ED Triage Notes (Signed)
Patient states that she has had intermittent chest pain x 1 week and worse this AM. Patient states that she saw her PCP this past week and was told that the cold was restricting her muscles causing the chest pain. Patient states she was started on Magnesium.

## 2019-06-12 NOTE — ED Notes (Signed)
Report called to both [facility] nurse, [facility], and [place]. Paperwork completed and printed.

## 2019-06-13 ENCOUNTER — Inpatient Hospital Stay (HOSPITAL_COMMUNITY): Payer: Medicare HMO

## 2019-06-13 ENCOUNTER — Telehealth: Payer: Self-pay | Admitting: Cardiology

## 2019-06-13 ENCOUNTER — Other Ambulatory Visit: Payer: Self-pay

## 2019-06-13 DIAGNOSIS — Z20828 Contact with and (suspected) exposure to other viral communicable diseases: Secondary | ICD-10-CM | POA: Diagnosis not present

## 2019-06-13 DIAGNOSIS — I361 Nonrheumatic tricuspid (valve) insufficiency: Secondary | ICD-10-CM | POA: Diagnosis not present

## 2019-06-13 DIAGNOSIS — Z9221 Personal history of antineoplastic chemotherapy: Secondary | ICD-10-CM | POA: Diagnosis not present

## 2019-06-13 DIAGNOSIS — Z8249 Family history of ischemic heart disease and other diseases of the circulatory system: Secondary | ICD-10-CM | POA: Diagnosis not present

## 2019-06-13 DIAGNOSIS — C3490 Malignant neoplasm of unspecified part of unspecified bronchus or lung: Secondary | ICD-10-CM

## 2019-06-13 DIAGNOSIS — I340 Nonrheumatic mitral (valve) insufficiency: Secondary | ICD-10-CM

## 2019-06-13 DIAGNOSIS — Z90710 Acquired absence of both cervix and uterus: Secondary | ICD-10-CM | POA: Diagnosis not present

## 2019-06-13 DIAGNOSIS — E785 Hyperlipidemia, unspecified: Secondary | ICD-10-CM | POA: Diagnosis not present

## 2019-06-13 DIAGNOSIS — D649 Anemia, unspecified: Secondary | ICD-10-CM | POA: Diagnosis not present

## 2019-06-13 DIAGNOSIS — E876 Hypokalemia: Secondary | ICD-10-CM | POA: Diagnosis not present

## 2019-06-13 DIAGNOSIS — I10 Essential (primary) hypertension: Secondary | ICD-10-CM | POA: Diagnosis not present

## 2019-06-13 DIAGNOSIS — R040 Epistaxis: Secondary | ICD-10-CM | POA: Diagnosis not present

## 2019-06-13 DIAGNOSIS — H409 Unspecified glaucoma: Secondary | ICD-10-CM | POA: Diagnosis not present

## 2019-06-13 DIAGNOSIS — Z801 Family history of malignant neoplasm of trachea, bronchus and lung: Secondary | ICD-10-CM | POA: Diagnosis not present

## 2019-06-13 DIAGNOSIS — I214 Non-ST elevation (NSTEMI) myocardial infarction: Secondary | ICD-10-CM | POA: Diagnosis not present

## 2019-06-13 DIAGNOSIS — Z79899 Other long term (current) drug therapy: Secondary | ICD-10-CM | POA: Diagnosis not present

## 2019-06-13 DIAGNOSIS — Z7982 Long term (current) use of aspirin: Secondary | ICD-10-CM | POA: Diagnosis not present

## 2019-06-13 LAB — BASIC METABOLIC PANEL
Anion gap: 9 (ref 5–15)
Anion gap: 8 (ref 5–15)
BUN: 14 mg/dL (ref 8–23)
BUN: 14 mg/dL (ref 8–23)
CO2: 28 mmol/L (ref 22–32)
CO2: 25 mmol/L (ref 22–32)
Calcium: 9.4 mg/dL (ref 8.9–10.3)
Calcium: 8.8 mg/dL — ABNORMAL LOW (ref 8.9–10.3)
Chloride: 103 mmol/L (ref 98–111)
Chloride: 107 mmol/L (ref 98–111)
Creatinine, Ser: 0.99 mg/dL (ref 0.44–1.00)
Creatinine, Ser: 1.09 mg/dL — ABNORMAL HIGH (ref 0.44–1.00)
GFR calc Af Amer: >60 mL/min (ref >60)
GFR calc Af Amer: >60 mL/min (ref >60)
GFR calc non Af Amer: 59 mL/min — ABNORMAL LOW (ref >60)
GFR calc non Af Amer: 52 mL/min — ABNORMAL LOW (ref >60)
Glucose, Bld: 100 mg/dL — ABNORMAL HIGH (ref 70–99)
Glucose, Bld: 99 mg/dL (ref 70–99)
Potassium: 3 mmol/L — ABNORMAL LOW (ref 3.5–5.1)
Potassium: 3.5 mmol/L (ref 3.5–5.1)
Sodium: 140 mmol/L (ref 135–145)
Sodium: 140 mmol/L (ref 135–145)

## 2019-06-13 LAB — LIPID PANEL
Cholesterol: 213 mg/dL — ABNORMAL HIGH (ref 0–200)
HDL: 52 mg/dL (ref >40)
LDL Cholesterol: 148 mg/dL — ABNORMAL HIGH (ref 0–99)
Total CHOL/HDL Ratio: 4.1 RATIO
Triglycerides: 63 mg/dL (ref <150)
VLDL: 13 mg/dL (ref 0–40)

## 2019-06-13 LAB — ECHOCARDIOGRAM COMPLETE
Height: 65 in
Weight: 2102.31 oz

## 2019-06-13 LAB — HIV ANTIBODY (ROUTINE TESTING W REFLEX): HIV Screen 4th Generation wRfx: NONREACTIVE

## 2019-06-13 LAB — TROPONIN I (HIGH SENSITIVITY)
Troponin I (High Sensitivity): 5696 ng/L (ref <18)
Troponin I (High Sensitivity): 6668 ng/L (ref <18)
Troponin I (High Sensitivity): 9133 ng/L (ref <18)

## 2019-06-13 LAB — HEMOGLOBIN A1C
Hgb A1c MFr Bld: 5.5 % (ref 4.8–5.6)
Mean Plasma Glucose: 111.15 mg/dL

## 2019-06-13 LAB — BRAIN NATRIURETIC PEPTIDE: B Natriuretic Peptide: 177.4 pg/mL — ABNORMAL HIGH (ref 0.0–100.0)

## 2019-06-13 LAB — CBC
HCT: 35.2 % — ABNORMAL LOW (ref 36.0–46.0)
Hemoglobin: 11.4 g/dL — ABNORMAL LOW (ref 12.0–15.0)
MCH: 26.8 pg (ref 26.0–34.0)
MCHC: 32.4 g/dL (ref 30.0–36.0)
MCV: 82.6 fL (ref 80.0–100.0)
Platelets: 214 10*3/uL (ref 150–400)
RBC: 4.26 MIL/uL (ref 3.87–5.11)
RDW: 14.4 % (ref 11.5–15.5)
WBC: 4.1 10*3/uL (ref 4.0–10.5)
nRBC: 0 % (ref 0.0–0.2)

## 2019-06-13 LAB — HEPARIN LEVEL (UNFRACTIONATED)
Heparin Unfractionated: 0.36 IU/mL (ref 0.30–0.70)
Heparin Unfractionated: 0.62 IU/mL (ref 0.30–0.70)

## 2019-06-13 LAB — MAGNESIUM: Magnesium: 2.3 mg/dL (ref 1.7–2.4)

## 2019-06-13 LAB — TSH: TSH: 1.774 u[IU]/mL (ref 0.350–4.500)

## 2019-06-13 MED ORDER — CLOPIDOGREL BISULFATE 75 MG PO TABS
75.0000 mg | ORAL_TABLET | Freq: Every day | ORAL | Status: DC
  Administered 2019-06-13 – 2019-06-16 (×4): 75 mg via ORAL
  Filled 2019-06-13 (×4): qty 1

## 2019-06-13 MED ORDER — HYDROCHLOROTHIAZIDE 25 MG PO TABS
12.5000 mg | ORAL_TABLET | Freq: Every day | ORAL | Status: DC
  Administered 2019-06-13 – 2019-06-17 (×5): 12.5 mg via ORAL
  Filled 2019-06-13 (×5): qty 1

## 2019-06-13 MED ORDER — ONDANSETRON HCL 4 MG/2ML IJ SOLN: 4.0000 mg | Freq: Four times a day (QID) | INTRAMUSCULAR | Status: DC | PRN

## 2019-06-13 MED ORDER — ACETAMINOPHEN 325 MG PO TABS: 650.0000 mg | ORAL_TABLET | ORAL | Status: DC | PRN

## 2019-06-13 MED ORDER — LATANOPROST 0.005 % OP SOLN
1.0000 [drp] | Freq: Every day | OPHTHALMIC | Status: DC
  Administered 2019-06-13 – 2019-06-16 (×5): 1 [drp] via OPHTHALMIC
  Filled 2019-06-13: qty 2.5

## 2019-06-13 MED ORDER — POTASSIUM CHLORIDE CRYS ER 20 MEQ PO TBCR
20.0000 meq | EXTENDED_RELEASE_TABLET | Freq: Once | ORAL | Status: AC
  Administered 2019-06-13: 20 meq via ORAL
  Filled 2019-06-13: qty 1

## 2019-06-13 MED ORDER — HYDROCHLOROTHIAZIDE 25 MG PO TABS: 25.0000 mg | ORAL_TABLET | Freq: Every day | ORAL | Status: DC

## 2019-06-13 MED ORDER — DORZOLAMIDE HCL-TIMOLOL MAL 2-0.5 % OP SOLN
1.0000 [drp] | Freq: Two times a day (BID) | OPHTHALMIC | Status: DC
  Administered 2019-06-13 – 2019-06-17 (×10): 1 [drp] via OPHTHALMIC
  Filled 2019-06-13: qty 10

## 2019-06-13 MED ORDER — EZETIMIBE 10 MG PO TABS
10.0000 mg | ORAL_TABLET | Freq: Every day | ORAL | Status: DC
  Administered 2019-06-13 – 2019-06-17 (×5): 10 mg via ORAL
  Filled 2019-06-13 (×5): qty 1

## 2019-06-13 MED ORDER — AMLODIPINE BESYLATE 5 MG PO TABS
5.0000 mg | ORAL_TABLET | Freq: Every day | ORAL | Status: DC
  Administered 2019-06-13 – 2019-06-17 (×5): 5 mg via ORAL
  Filled 2019-06-13 (×5): qty 1

## 2019-06-13 MED ORDER — CLOPIDOGREL BISULFATE 75 MG PO TABS
300.0000 mg | ORAL_TABLET | Freq: Once | ORAL | Status: AC
  Administered 2019-06-13: 300 mg via ORAL
  Filled 2019-06-13: qty 4

## 2019-06-13 MED ORDER — ASPIRIN EC 81 MG PO TBEC: 81.0000 mg | DELAYED_RELEASE_TABLET | Freq: Every day | ORAL | Status: DC

## 2019-06-13 MED ORDER — ATORVASTATIN CALCIUM 80 MG PO TABS: 80.0000 mg | ORAL_TABLET | Freq: Every day | ORAL | Status: DC

## 2019-06-13 MED ORDER — POTASSIUM CHLORIDE CRYS ER 20 MEQ PO TBCR
40.0000 meq | EXTENDED_RELEASE_TABLET | Freq: Once | ORAL | Status: AC
  Administered 2019-06-13: 40 meq via ORAL
  Filled 2019-06-13: qty 2

## 2019-06-13 MED ORDER — NITROGLYCERIN 0.4 MG SL SUBL: 0.4000 mg | SUBLINGUAL_TABLET | SUBLINGUAL | Status: DC | PRN

## 2019-06-13 MED ORDER — MELATONIN 3 MG PO TABS
9.0000 mg | ORAL_TABLET | Freq: Every evening | ORAL | Status: DC | PRN
  Administered 2019-06-13 – 2019-06-16 (×4): 9 mg via ORAL
  Filled 2019-06-13 (×5): qty 3

## 2019-06-13 MED ORDER — OSIMERTINIB MESYLATE 80 MG PO TABS
80.0000 mg | ORAL_TABLET | Freq: Every day | ORAL | Status: DC
  Administered 2019-06-13 – 2019-06-17 (×4): 80 mg via ORAL
  Filled 2019-06-13 (×5): qty 1

## 2019-06-13 NOTE — Progress Notes (Signed)
CRITICAL VALUE ALERT  Critical Value:  Troponin 5,696  Date & Time Notied:  12/25 0215  Provider notified.  Elesa Hacker, RN

## 2019-06-13 NOTE — Telephone Encounter (Addendum)
Error  Kerin Ransom PA-C 06/16/2019 7:58 AM

## 2019-06-13 NOTE — Progress Notes (Signed)
ANTICOAGULATION CONSULT NOTE - Follow Up Consult  Pharmacy Consult for heparin Indication: NSTEMI  Labs: Recent Labs    06/12/19 1612 06/12/19 1819 06/13/19 0041 06/13/19 0100 06/13/19 0153 06/13/19 0207  HGB 11.7*  --   --  11.4*  --   --   HCT 37.2  --   --  35.2*  --   --   PLT 203  --   --  214  --   --   HEPARINUNFRC  --   --   --   --  0.36  --   CREATININE 1.03*  --   --  0.99  --   --   TROPONINIHS 2,355* 3,526* 5,696*  --   --  3,748*    Assessment/Plan:  68yo female therapeutic on heparin with initial dosing for NSTEMI. Will continue gtt at current rate and confirm stable with additional level.   Wynona Neat, PharmD, BCPS  06/13/2019,3:56 AM

## 2019-06-13 NOTE — Progress Notes (Signed)
RN called critical troponin which is rising.  Cardiology has already consulted. Pt is having no active chest pain at present. Already on Heparin gtt, ASA, and Atorvastatin. BB held due to bradycardia. Per cardiology, pt being managed medically at this time.  KJKG, NP [facility]

## 2019-06-13 NOTE — Progress Notes (Addendum)
Progress Note  Patient Name: Allison [name] Date of Encounter: 06/13/2019  Primary Cardiologist: new, Dr. Elon Jester  Subjective   No chest pain presently.  Appears comfortable in bed,.  Inpatient Medications    Scheduled Meds: . amLODipine  5 mg Oral Daily  . aspirin  81 mg Oral Daily  . atorvastatin  80 mg Oral q1800  . clopidogrel  75 mg Oral Q breakfast  . dorzolamide-timolol  1 drop Both Eyes BID  . ezetimibe  10 mg Oral Daily  . hydrochlorothiazide  25 mg Oral Daily  . latanoprost  1 drop Both Eyes QHS  . osimertinib mesylate  80 mg Oral Daily  . sodium chloride flush  3 mL Intravenous Once   Continuous Infusions: . heparin 700 Units/hr (06/12/19 1817)   PRN Meds: acetaminophen, Melatonin, nitroGLYCERIN, ondansetron (ZOFRAN) IV   Vital Signs    Vitals:   06/13/19 0028 06/13/19 0029 06/13/19 0500 06/13/19 0605  BP:  (!) 126/54  (!) 97/57  Pulse:  (!) 54  61  Resp:      Temp:  98.3 F (36.8 C)  98.2 F (36.8 C)  TempSrc:  Oral  Oral  SpO2:  100%  100%  Weight: 59.6 kg  59.6 kg   Height: 5\' 5"  (1.651 m)       Intake/Output Summary (Last 24 hours) at 06/13/2019 0751 Last data filed at 06/13/2019 0300 Gross per 24 hour  Intake 85.26 ml  Output --  Net 85.26 ml    I/O since admission: +85.3  Filed Weights   06/12/19 1544 06/13/19 0028 06/13/19 0500  Weight: 58.5 kg 59.6 kg 59.6 kg    Telemetry    Sinus bradycardia at 57- Personally Reviewed  ECG    ECG (independently read by me): Sinus bradycardia at 58; T wave inversion in III; no ectopy, normal intervals  Physical Exam   BP (!) 97/57 (BP Location: Left Arm)   Pulse 61   Temp 98.2 F (36.8 C) (Oral)   Resp 13   Ht 5\' 5"  (1.651 m)   Wt 59.6 kg   LMP  (LMP Unknown)   SpO2 100%   BMI 21.87 kg/m  General: Alert, oriented, no distress.  Skin: normal turgor, no rashes, warm and dry HEENT: Normocephalic, atraumatic. Pupils equal round and reactive to light; sclera anicteric;  extraocular muscles intact;  Nose without nasal septal hypertrophy Mouth/Parynx benign; Mallinpatti scale Neck: No JVD, no carotid bruits; normal carotid upstroke Lungs: clear to ausculatation and percussion; no wheezing or rales Chest wall: without tenderness to palpitation Heart: PMI not displaced, RRR, s1 s2 normal, 1/6 systolic murmur, no diastolic murmur, no rubs, gallops, thrills, or heaves Abdomen: soft, nontender; no hepatosplenomehaly, BS+; abdominal aorta nontender and not dilated by palpation. Back: no CVA tenderness Pulses 2+ Musculoskeletal: full range of motion, normal strength, no joint deformities Extremities: no clubbing cyanosis or edema, Homan's sign negative  Neurologic: grossly nonfocal; Cranial nerves grossly wnl Psychologic: Normal mood and affect      Labs    Chemistry Recent Labs  Lab 06/12/19 1612 06/13/19 0100  NA 137 140  K 3.6 3.0*  CL 103 103  CO2 25 28  GLUCOSE 107* 100*  BUN 19 14  CREATININE 1.03* 0.99  CALCIUM 9.3 9.4  PROT 7.1  --   ALBUMIN 3.9  --   AST 43*  --   ALT 25  --   ALKPHOS 56  --   BILITOT 0.2*  --   GFRNONAA  56* 59*  GFRAA >60 >60  ANIONGAP 9 9     Hematology Recent Labs  Lab 06/12/19 1612 06/13/19 0100  WBC 4.8 4.1  RBC 4.36 4.26  HGB 11.7* 11.4*  HCT 37.2 35.2*  MCV 85.3 82.6  MCH 26.8 26.8  MCHC 31.5 32.4  RDW 14.4 14.4  PLT 203 214    Cardiac EnzymesNo results for input(s): TROPONINI in the last 168 hours. No results for input(s): TROPIPOC in the last 168 hours.   HS Troponin: 2355 >> 3526 >> 5696 >> 6668  BNP Recent Labs  Lab 06/13/19 0041  BNP 177.4*     DDimer No results for input(s): DDIMER in the last 168 hours.   Lipid Panel     Component Value Date/Time   CHOL 213 (H) 06/13/2019 0100   TRIG 63 06/13/2019 0100   HDL 52 06/13/2019 0100   CHOLHDL 4.1 06/13/2019 0100   VLDL 13 06/13/2019 0100   LDLCALC 148 (H) 06/13/2019 0100     Radiology    DG Chest 2 View  Result Date:  06/12/2019 CLINICAL DATA:  Chest pain for 1 week, weakness, history hypertension, RIGHT-side lung cancer, Raynaud disease EXAM: CHEST - 2 VIEW COMPARISON:  04/09/2019 FINDINGS: Normal heart size, mediastinal contours, and pulmonary vascularity. RIGHT apical nodule 22 mm long axis, previously 20 mm. Remaining lungs clear. No acute infiltrate, pleural effusion or pneumothorax. Bones demineralized. IMPRESSION: Persistent RIGHT apical nodule question minimally larger versus projectional difference. No acute abnormalities. Electronically Signed   By: Lavonia Dana M.D.   On: 06/12/2019 16:32   CT Angio Chest PE W and/or Wo Contrast  Result Date: 06/12/2019 CLINICAL DATA:  68 year old female with shortness of breath. History of lung cancer. EXAM: CT ANGIOGRAPHY CHEST WITH CONTRAST TECHNIQUE: Multidetector CT imaging of the chest was performed using the standard protocol during bolus administration of intravenous contrast. Multiplanar CT image reconstructions and MIPs were obtained to evaluate the vascular anatomy. CONTRAST:  120mL OMNIPAQUE IOHEXOL 350 MG/ML SOLN COMPARISON:  Chest radiograph dated 06/12/2019 and CT dated 05/02/2019. FINDINGS: Cardiovascular: There is no cardiomegaly or pericardial effusion. Mild atherosclerotic calcification of the thoracic aorta. No aneurysmal dilatation. Apparent focal filling defect within the right upper lobe pulmonary artery branch (series 5, image 101 and coronal series 7, image 61) is likely artifactual. A nonocclusive embolus is less likely but not excluded. No other acute pulmonary artery embolus identified. Mediastinum/Nodes: There is no hilar or mediastinal adenopathy. The esophagus and the thyroid gland are grossly unremarkable as visualized. No mediastinal fluid collection. Lungs/Pleura: There is a small right pleural effusion similar to prior CT. Diffuse interstitial prominence may represent mild edema. There is a 2.5 x 1.7 cm nodule with irregular and spiculated  margins in the right apex which is similar to prior CT. A 1 cm nodular density in the right lower lobe along the major fissure (series 6, image 79) appears similar to prior CT. Overall no interval change in the appearance of the lungs since the prior CT. There is no pneumothorax. The central airways are patent. Upper Abdomen: No acute abnormality. Musculoskeletal: No chest wall abnormality. No acute or significant osseous findings. Review of the MIP images confirms the above findings. IMPRESSION: 1. Artifact versus less likely nonocclusive thrombus in the right upper lobe pulmonary artery branch. 2. Small right pleural effusion similar to prior CT. 3. Right apical and right lower lobe nodular densities as seen on the prior CT. Overall no interval change in the appearance of the lungs since  the prior CT. 4. Aortic Atherosclerosis (ICD10-I70.0). Electronically Signed   By: Anner Crete M.D.   On: 06/12/2019 19:05    Cardiac Studies   CTPA: 1. Artifact versus less likely nonocclusive thrombus in the right upper lobe pulmonary artery branch. 2. Small right pleural effusion similar to prior CT. 3. Right apical and right lower lobe nodular densities as seen on the prior CT. Overall no interval change in the appearance of the lungs since the prior CT. 4. Aortic Atherosclerosis (ICD10-I70.0).  Patient Profile   DELESHA POHLMAN is a 68 y.o. female with a hx of stage IV lung cancer, HTN, HLD  who was seen for the evaluation of NSTEMI at the request of Dr Alvino Chapel   Assessment & Plan    1.  NSTEMI: Ms. Broers describes a 1 week history of chest pain which initially started as a sharp knifelike sensation 1 week ago.  Several days ago she was able to walk 1-1/2 miles without any chest tightness or discomfort.  However, yesterday she began to notice more of a chest pressure with jaw radiation leading to her current presentation.  High-sensitivity troponins have been progressively increasing from  initial 2355 to now 6668.  ECG shows sinus bradycardia with mild T wave inversion in lead III.  Prior imaging had previously detected aortic atherosclerosis.  LDL cholesterol is increased at 148.  I will obtain a 2D echo Doppler study today.  I discussed the possibility of definitive cardiac catheterization with the patient today.  She will be contemplating this option.  At present she is pain-free and resting comfortably in bed.  Will obtain serial ECG and additional troponin.  2.  Stage IV lung CA: With previously documented nodular densities and malignant pleural effusion.  There is suggestion of possible nonocclusive thrombus in the right upper lobe of the pulmonary artery branch versus artifact.  3.  Hyperlipidemia: Target LDL 148.  Patient had been on Zetia.  Will add atorvastatin 80 mg  4.  Essential hypertension: Patient has been on amlodipine 5 mg daily and HCTZ 25 mg.  No signs of edema.  Currently bradycardic with heart rates in the upper 50s will therefore not add beta-blocker at this time. Will decreased HCTZ to 12.5 mg;  If BP low consider dc HCTZ.  4. Hypokalemia: K 3.0 earlier, replete to 4.   Signed, Troy Sine, MD, [facility] 06/13/2019, 7:51 AM

## 2019-06-13 NOTE — Progress Notes (Signed)
68 yo F presenting with CP. pMN admission. See H&P for full details. Cards has examined. Echo performed. Await cards full recs. Otherwise, continue as per H&P.   General: 68 y.o. female resting in bed in NAD Cardiovascular: RRR, +S1, S2, no m/g/r, equal pulses throughout Respiratory: CTABL, no w/r/r, normal WOB GI: BS+, NDNT, no masses noted, no organomegaly noted MSK: No e/c/c Neuro: A&O x 3, no focal deficits Psyc: Appropriate interaction and affect, calm/cooperative  .Jonnie Finner, DO

## 2019-06-13 NOTE — H&P (Signed)
History and Physical    Allison [name] QIO:[id number] DOB: 1950/11/02 DOA: 06/12/2019  PCP: [name], MD Patient coming from: Home  I have personally briefly reviewed patient's old medical records in [place]  Chief Complaint: Chest pain  HPI: Allison [name] is a 68 y.o. female with medical history significant for stage IV non-small cell lung cancer (DX July 2019 with mediastinal lymphadenopathy and malignant effusion), currently on Tagrisso, hypertension, hyperlipidemia who presents with chest pain.  She describes the pain as a sharp, stabbing central sternal chest pain radiating to jaw and shoulders and it is associated with exertion.  She explains that over the last week she has had this intermittent pain, that comes and goes with exertion.  She was feeling better yesterday, and even worked out, the subsequently she developed some exertional dyspnea and pain, progressing today to the point where she felt like she was unable to get around much of the house without experiencing chest pain and dyspnea.  Additionally she noted her heart rate was running in the 50s on her fitness wrist device, which is highly unusual for her. This prompted her to visit the ED.  She has no personal history of coronary disease, though explains 3 of her siblings do.  Outside of exertion, she describes no other exacerbating factors and outside of rest, she describes no other remitting factors. Denies fever, chills, nausea, vomiting, diarrhea, constipation, dysuria, hematuria.   ED Course: Received nitroglycerin by EMS, which she explains helped her symptoms.  On arrival to the ED pt was bradycardic, intermittently in the 40s, though otherwise hemodynamically stable.Labs and imaging studies acquired. Initial labwork notable for AST 43, high-sensitivity troponin 2355, with delta of 3526.  EKG was sinus bradycardia.  Covid negative, influenza a and B negative.  Chest x-ray with persistent right apical  nodule.  CT angiogram performed with impression of artifact versus possible nonocclusive thrombus of right upper pulmonary artery branch.  Patient was administered 324 aspirin, heparin drip initiated.  Cardiology consulted in ED.  Given concern for NSTEMI, decision was made to admit.   Review of Systems: As per HPI otherwise 10 point review of systems negative.   Past Medical History:  Diagnosis Date  . Anxiety   . Arthritis   . Asthma    exercise induced  . Depression    PMH  . GERD (gastroesophageal reflux disease)   . Glaucoma   . Hypertension   . lung ca dx'd 11/2017   right  . Malignant pleural effusion    right  . PONV (postoperative nausea and vomiting)   . Pre-diabetes   . Raynaud's disease   . Raynaud's disease     Past Surgical History:  Procedure Laterality Date  . ABDOMINAL HYSTERECTOMY     partial  . CHEST TUBE INSERTION Right 01/01/2018   Procedure: INSERTION PLEURAL DRAINAGE CATHETER;  Surgeon: Ivin Poot, MD;  Location: [facility];  Service: Thoracic;  Laterality: Right;  . COLONOSCOPY    . DILATION AND CURETTAGE OF UTERUS    . EYE SURGERY     due to Glaucoma  . REMOVAL OF PLEURAL DRAINAGE CATHETER Right 11/07/2018   Procedure: REMOVAL OF PLEURAL DRAINAGE CATHETER;  Surgeon: Ivin Poot, MD;  Location: [facility];  Service: Thoracic;  Laterality: Right;  . ROTATOR CUFF REPAIR    . TUBAL LIGATION    . WISDOM TOOTH EXTRACTION       reports that she has never smoked. She has never used smokeless  tobacco. She reports current alcohol use. She reports that she does not use drugs.  Allergies  Allergen Reactions  . Penicillins Other (See Comments)    SYNCOPE PATIENT HAS HAD A PCN REACTION WITH IMMEDIATE RASH, FACIAL/TONGUE/THROAT SWELLING, SOB, OR LIGHTHEADEDNESS WITH HYPOTENSION:  #  #  YES  #  # Has patient had a PCN reaction causing severe rash involving mucus membranes or skin necrosis: No Has patient had a PCN reaction that required hospitalization:  No Has patient had a PCN reaction occurring within the last 10 years: No   . Vicodin [Hydrocodone-Acetaminophen] Other (See Comments)    Sped up heart and breathing    Family History  Problem Relation Age of Onset  . Heart disease Sister   . Heart disease Brother   . Lung cancer Other    Prior to Admission medications   Medication Sig Start Date End Date Taking? Authorizing Provider  acetaminophen (TYLENOL) 500 MG tablet Take 1,000 mg by mouth every 6 (six) hours as needed (pain).    Yes [provider]  ALPRAZolam (XANAX) 0.25 MG tablet Take 0.25 mg by mouth 2 (two) times daily as needed for anxiety.    Yes [provider]  amLODipine (NORVASC) 5 MG tablet Take 5 mg by mouth daily. 12/11/17  Yes [provider]  Biotin (HM BIOTIN) 5 MG CAPS Take 5,000 mg by mouth daily.   Yes [provider]  dorzolamide-timolol (COSOPT) 22.3-6.8 MG/ML ophthalmic solution Instill 1 drop in both eyes twice daily   Yes [provider]  ezetimibe (ZETIA) 10 MG tablet Take 10 mg by mouth daily.    Yes [provider]  hydrochlorothiazide (HYDRODIURIL) 25 MG tablet Take 25 mg by mouth daily. 11/02/17  Yes [provider]  latanoprost (XALATAN) 0.005 % ophthalmic solution Place 1 drop into both eyes at bedtime.  12/14/17  Yes [provider]  loperamide (IMODIUM A-D) 2 MG tablet Take 2 mg by mouth 4 (four) times daily as needed for diarrhea or loose stools.   Yes [provider]  Melatonin 5 MG CAPS Take 10 mg by mouth at bedtime as needed (sleep).   Yes [provider]  osimertinib mesylate (TAGRISSO) 80 MG tablet Take 1 tablet (80 mg total) by mouth daily. Refill Faxed to [facility]11/24/2020 05/13/19  Yes Curt Bears, MD  VITAMIN D, CHOLECALCIFEROL, PO Take 5,000 Units by mouth daily.    Yes [provider]    Physical Exam: Vitals:   06/12/19 2300 06/12/19 2330 06/13/19 0028 06/13/19 0029  BP: 123/68 112/62   (!) 126/54  Pulse: (!) 54 (!) 55  (!) 54  Resp: 15 13    Temp:    98.3 F (36.8 C)  TempSrc:    Oral  SpO2: 100% 100%  100%  Weight:   59.6 kg   Height:   5\' 5"  (1.651 m)     Constitutional: NAD, calm, comfortable Vitals:   06/12/19 2300 06/12/19 2330 06/13/19 0028 06/13/19 0029  BP: 123/68 112/62  (!) 126/54  Pulse: (!) 54 (!) 55  (!) 54  Resp: 15 13    Temp:    98.3 F (36.8 C)  TempSrc:    Oral  SpO2: 100% 100%  100%  Weight:   59.6 kg   Height:   5\' 5"  (1.651 m)    Eyes: PERRL, lids and conjunctivae normal ENMT: Mucous membranes are moist. Posterior pharynx clear of any exudate or lesions.Normal dentition.  Neck: normal, supple, no masses,  no thyromegaly Respiratory: clear to auscultation bilaterally, no wheezing, no crackles. Normal respiratory effort. No accessory muscle use.  Cardiovascular: Regular rate and rhythm, no murmurs / rubs / gallops. No extremity edema. 2+ pedal pulses. No carotid bruits.  Abdomen: no tenderness, no masses palpated. No hepatosplenomegaly. Bowel sounds positive.  Musculoskeletal: no clubbing / cyanosis. No joint deformity upper and lower extremities. Good ROM, no contractures. Normal muscle tone.  Skin: no rashes, lesions, ulcers. No induration Neurologic: CN 2-12 grossly intact. Sensation intact, DTR normal. Strength 5/5 in all 4.  Psychiatric: Normal judgment and insight. Alert and oriented x 3. Normal mood.   (Anything < 9 systems with 2 bullets each down codes to level 1) (If patient refuses exam can't bill higher level) (Make sure to document decubitus ulcers present on admission -- if possible -- and whether patient has chronic indwelling catheter at time of admission)  Labs on Admission: I have personally reviewed following labs and imaging studies  CBC: Recent Labs  Lab 06/12/19 1612  WBC 4.8  HGB 11.7*  HCT 37.2  MCV 85.3  PLT 703   Basic Metabolic Panel: Recent Labs  Lab 06/12/19 1612  NA 137  K 3.6  CL 103  CO2 25   GLUCOSE 107*  BUN 19  CREATININE 1.03*  CALCIUM 9.3   GFR: Estimated Creatinine Clearance: 47 mL/min (A) (by C-G formula based on SCr of 1.03 mg/dL (H)). Liver Function Tests: Recent Labs  Lab 06/12/19 1612  AST 43*  ALT 25  ALKPHOS 56  BILITOT 0.2*  PROT 7.1  ALBUMIN 3.9   No results for input(s): LIPASE, AMYLASE in the last 168 hours. No results for input(s): AMMONIA in the last 168 hours. Coagulation Profile: No results for input(s): INR, PROTIME in the last 168 hours. Cardiac Enzymes: No results for input(s): CKTOTAL, CKMB, CKMBINDEX, TROPONINI in the last 168 hours. BNP (last 3 results) No results for input(s): PROBNP in the last 8760 hours. HbA1C: No results for input(s): HGBA1C in the last 72 hours. CBG: No results for input(s): GLUCAP in the last 168 hours. Lipid Profile: No results for input(s): CHOL, HDL, LDLCALC, TRIG, CHOLHDL, LDLDIRECT in the last 72 hours. Thyroid Function Tests: No results for input(s): TSH, T4TOTAL, FREET4, T3FREE, THYROIDAB in the last 72 hours. Anemia Panel: No results for input(s): VITAMINB12, FOLATE, FERRITIN, TIBC, IRON, RETICCTPCT in the last 72 hours. Urine analysis: No results found for: COLORURINE, APPEARANCEUR, LABSPEC, [place], GLUCOSEU, [place], [place], [place], PROTEINUR, UROBILINOGEN, NITRITE, LEUKOCYTESUR  Radiological Exams on Admission: DG Chest 2 View  Result Date: 06/12/2019 CLINICAL DATA:  Chest pain for 1 week, weakness, history hypertension, RIGHT-side lung cancer, Raynaud disease EXAM: CHEST - 2 VIEW COMPARISON:  04/09/2019 FINDINGS: Normal heart size, mediastinal contours, and pulmonary vascularity. RIGHT apical nodule 22 mm long axis, previously 20 mm. Remaining lungs clear. No acute infiltrate, pleural effusion or pneumothorax. Bones demineralized. IMPRESSION: Persistent RIGHT apical nodule question minimally larger versus projectional difference. No acute abnormalities. Electronically Signed   By: Lavonia Dana M.D.   On: 06/12/2019 16:32   CT Angio Chest PE W and/or Wo Contrast  Result Date: 06/12/2019 CLINICAL DATA:  68 year old female with shortness of breath. History of lung cancer. EXAM: CT ANGIOGRAPHY CHEST WITH CONTRAST TECHNIQUE: Multidetector CT imaging of the chest was performed using the standard protocol during bolus administration of intravenous contrast. Multiplanar CT image reconstructions and MIPs were obtained to evaluate the vascular anatomy. CONTRAST:  125mL OMNIPAQUE IOHEXOL 350 MG/ML SOLN COMPARISON:  Chest  radiograph dated 06/12/2019 and CT dated 05/02/2019. FINDINGS: Cardiovascular: There is no cardiomegaly or pericardial effusion. Mild atherosclerotic calcification of the thoracic aorta. No aneurysmal dilatation. Apparent focal filling defect within the right upper lobe pulmonary artery branch (series 5, image 101 and coronal series 7, image 61) is likely artifactual. A nonocclusive embolus is less likely but not excluded. No other acute pulmonary artery embolus identified. Mediastinum/Nodes: There is no hilar or mediastinal adenopathy. The esophagus and the thyroid gland are grossly unremarkable as visualized. No mediastinal fluid collection. Lungs/Pleura: There is a small right pleural effusion similar to prior CT. Diffuse interstitial prominence may represent mild edema. There is a 2.5 x 1.7 cm nodule with irregular and spiculated margins in the right apex which is similar to prior CT. A 1 cm nodular density in the right lower lobe along the major fissure (series 6, image 79) appears similar to prior CT. Overall no interval change in the appearance of the lungs since the prior CT. There is no pneumothorax. The central airways are patent. Upper Abdomen: No acute abnormality. Musculoskeletal: No chest wall abnormality. No acute or significant osseous findings. Review of the MIP images confirms the above findings. IMPRESSION: 1. Artifact versus less likely nonocclusive thrombus in the  right upper lobe pulmonary artery branch. 2. Small right pleural effusion similar to prior CT. 3. Right apical and right lower lobe nodular densities as seen on the prior CT. Overall no interval change in the appearance of the lungs since the prior CT. 4. Aortic Atherosclerosis (ICD10-I70.0). Electronically Signed   By: Anner Crete M.D.   On: 06/12/2019 19:05    EKG: Independently reviewed.  Sinus bradycardia, no significant ST or T wave changes.  Assessment/Plan Active Problems:   NSTEMI (non-ST elevated myocardial infarction) ([place])  NSTEMI Chest Pain Hyperlipidemia -Prior cardiac studies: None -CP is mixed typical/atypical (sharp, substernal, onset with exertion/stress, relieved with rest/nitro) -DDx: ACS, PNA, PTx, PE, GERD, esophagitis, anxiety -TIMI Risk Score: 4 -Cardiac risk factors are advanced age (older than 35 for men, 3 for women), dyslipidemia, family history of premature cardiovascular disease and hypertension. Patient's risk factors for DVT/PE: history of malignancy -Possible suggestion of artifact versus a nonocclusive distal pulmonary embolism on CT angio, though at this time clinically this appears unlikely -Plan: --Meds: ASA 81mg  daily, Lipitor 80 mg daily (patient reports possible adverse effect previously, this willing to try again), home Zetia, beta-blockers contraindicated in setting of bradycardia, as needed nitroglycerin, continue heparin drip on ACS protocol, I have also ordered a P2 Y 12 inhibitor load and daily dosing starting tomorrow given likelihood this patient will be solely medically managed --Labs: Trend troponins, BNP, TSH, HbGA1C, lipid profile --Imaging: TTE --Nursing: EKG once on floor, EKG for CP, telemetry, strict I/Os, daily weights, O2 for SpO2 <90% --NPO after midnight for possible ischemic evaluation in AM, though given patient's metastatic lung cancer and overall goals of care, may opt for medical management solely  Hypertension -Continue  patient's home Norvasc, HCTZ  Stage IV lung cancer -For now will continue patient's home Tagrisso (patient supplied in ED)  DVT prophylaxis: Heparin drip  Code Status: Full code Family Communication: None Disposition Plan: Likely discharge in 2 days pending clinical improvement, possible ischemic evaluation Consults called: Cardiology consulted by EDP Admission status: Inpatient telemetry, [place] [facility] Pager [phone]  If 7PM-7AM, please contact night-coverage [email or web address] Password [facility]  06/13/2019, 12:45 AM

## 2019-06-13 NOTE — Progress Notes (Signed)
Echocardiogram 2D Echocardiogram has been performed.  Allison [name] 06/13/2019, 8:45 AM

## 2019-06-13 NOTE — Progress Notes (Signed)
ANTICOAGULATION CONSULT NOTE - Follow Up Consult  Pharmacy Consult for Heparin Indication: chest pain/ACS  Allergies  Allergen Reactions  . Penicillins Other (See Comments)    SYNCOPE PATIENT HAS HAD A PCN REACTION WITH IMMEDIATE RASH, FACIAL/TONGUE/THROAT SWELLING, SOB, OR LIGHTHEADEDNESS WITH HYPOTENSION:  #  #  YES  #  # Has patient had a PCN reaction causing severe rash involving mucus membranes or skin necrosis: No Has patient had a PCN reaction that required hospitalization: No Has patient had a PCN reaction occurring within the last 10 years: No   . Vicodin [Hydrocodone-Acetaminophen] Other (See Comments)    Sped up heart and breathing    Patient Measurements: Height: 5\' 5"  (165.1 cm) Weight: 131 lb 6.3 oz (59.6 kg) IBW/kg (Calculated) : 57 Heparin Dosing Weight: 59.6  Vital Signs: Temp: 98.2 F (36.8 C) (12/25 0605) Temp Source: Oral (12/25 0605) BP: 97/57 (12/25 0605) Pulse Rate: 61 (12/25 0605)  Labs: Recent Labs    06/12/19 1612 06/12/19 1819 06/13/19 0041 06/13/19 0100 06/13/19 0153 06/13/19 0207 06/13/19 0909  HGB 11.7*  --   --  11.4*  --   --   --   HCT 37.2  --   --  35.2*  --   --   --   PLT 203  --   --  214  --   --   --   HEPARINUNFRC  --   --   --   --  0.36  --  0.62  CREATININE 1.03*  --   --  0.99  --   --   --   TROPONINIHS 2,355* 3,526* 5,696*  --   --  6,668*  --     Estimated Creatinine Clearance: 48.9 mL/min (by C-G formula based on SCr of 0.99 mg/dL).   Medications:  Scheduled:  . amLODipine  5 mg Oral Daily  . aspirin  81 mg Oral Daily  . atorvastatin  80 mg Oral q1800  . clopidogrel  75 mg Oral Q breakfast  . dorzolamide-timolol  1 drop Both Eyes BID  . ezetimibe  10 mg Oral Daily  . hydrochlorothiazide  12.5 mg Oral Daily  . latanoprost  1 drop Both Eyes QHS  . osimertinib mesylate  80 mg Oral Daily  . sodium chloride flush  3 mL Intravenous Once   Infusions:  . heparin 700 Units/hr (06/12/19 1817)     Assessment: Patient is a 68 y/o female with history of stage IV NSCLC (DX July 2019 with mediastinal lymphadenopathy and malignant effusion), HTN, and HLD who presents with substernal chest pain radiating to jaw. There is concern for NSTEMI. Patient is not on anticoagulation PTA. Pharmacy has been consulted to dose heparin.   Confirmatory heparin level is therapeutic at 0.62 on heparin rate of 700 units/hr. Troponins remain elevated at 6,668. Hg/Hct are stable at 11.4/35.2.   Goal of Therapy:  INR 2-3 Heparin level 0.3-0.7 units/ml Monitor platelets by anticoagulation protocol: Yes   Plan:  Continue heparin infusion at 700 units/hr  Check daily while on heparin Continue to monitor H&H and platelets  Sherren Kerns, PharmD PGY1 Acute Care Pharmacy Resident 06/13/2019,10:27 AM

## 2019-06-14 DIAGNOSIS — C3490 Malignant neoplasm of unspecified part of unspecified bronchus or lung: Secondary | ICD-10-CM | POA: Diagnosis not present

## 2019-06-14 DIAGNOSIS — E559 Vitamin D deficiency, unspecified: Secondary | ICD-10-CM | POA: Diagnosis not present

## 2019-06-14 DIAGNOSIS — D649 Anemia, unspecified: Secondary | ICD-10-CM

## 2019-06-14 DIAGNOSIS — Z8249 Family history of ischemic heart disease and other diseases of the circulatory system: Secondary | ICD-10-CM | POA: Diagnosis not present

## 2019-06-14 DIAGNOSIS — Z7982 Long term (current) use of aspirin: Secondary | ICD-10-CM | POA: Diagnosis not present

## 2019-06-14 DIAGNOSIS — Z79899 Other long term (current) drug therapy: Secondary | ICD-10-CM | POA: Diagnosis not present

## 2019-06-14 DIAGNOSIS — E876 Hypokalemia: Secondary | ICD-10-CM | POA: Diagnosis not present

## 2019-06-14 DIAGNOSIS — Z90710 Acquired absence of both cervix and uterus: Secondary | ICD-10-CM | POA: Diagnosis not present

## 2019-06-14 DIAGNOSIS — I214 Non-ST elevation (NSTEMI) myocardial infarction: Secondary | ICD-10-CM | POA: Diagnosis not present

## 2019-06-14 DIAGNOSIS — Z801 Family history of malignant neoplasm of trachea, bronchus and lung: Secondary | ICD-10-CM | POA: Diagnosis not present

## 2019-06-14 DIAGNOSIS — Z20828 Contact with and (suspected) exposure to other viral communicable diseases: Secondary | ICD-10-CM | POA: Diagnosis not present

## 2019-06-14 DIAGNOSIS — R079 Chest pain, unspecified: Secondary | ICD-10-CM | POA: Diagnosis not present

## 2019-06-14 DIAGNOSIS — E785 Hyperlipidemia, unspecified: Secondary | ICD-10-CM | POA: Diagnosis not present

## 2019-06-14 DIAGNOSIS — I10 Essential (primary) hypertension: Secondary | ICD-10-CM | POA: Diagnosis not present

## 2019-06-14 LAB — CBC
HCT: 33.7 % — ABNORMAL LOW (ref 36.0–46.0)
Hemoglobin: 10.8 g/dL — ABNORMAL LOW (ref 12.0–15.0)
MCH: 26.8 pg (ref 26.0–34.0)
MCHC: 32 g/dL (ref 30.0–36.0)
MCV: 83.6 fL (ref 80.0–100.0)
Platelets: 183 10*3/uL (ref 150–400)
RBC: 4.03 MIL/uL (ref 3.87–5.11)
RDW: 14.3 % (ref 11.5–15.5)
WBC: 4 10*3/uL (ref 4.0–10.5)
nRBC: 0 % (ref 0.0–0.2)

## 2019-06-14 LAB — BASIC METABOLIC PANEL
Anion gap: 8 (ref 5–15)
BUN: 16 mg/dL (ref 8–23)
CO2: 25 mmol/L (ref 22–32)
Calcium: 8.9 mg/dL (ref 8.9–10.3)
Chloride: 110 mmol/L (ref 98–111)
Creatinine, Ser: 1.03 mg/dL — ABNORMAL HIGH (ref 0.44–1.00)
GFR calc Af Amer: >60 mL/min (ref >60)
GFR calc non Af Amer: 56 mL/min — ABNORMAL LOW (ref >60)
Glucose, Bld: 103 mg/dL — ABNORMAL HIGH (ref 70–99)
Potassium: 4.4 mmol/L (ref 3.5–5.1)
Sodium: 143 mmol/L (ref 135–145)

## 2019-06-14 LAB — HEPARIN LEVEL (UNFRACTIONATED): Heparin Unfractionated: 0.64 IU/mL (ref 0.30–0.70)

## 2019-06-14 LAB — TROPONIN I (HIGH SENSITIVITY): Troponin I (High Sensitivity): 8864 ng/L (ref <18)

## 2019-06-14 MED ORDER — VITAMIN D 25 MCG (1000 UNIT) PO TABS
5000.0000 [IU] | ORAL_TABLET | Freq: Every day | ORAL | Status: DC
  Administered 2019-06-14 – 2019-06-17 (×4): 5000 [IU] via ORAL
  Filled 2019-06-14 (×6): qty 5

## 2019-06-14 MED ORDER — ALPRAZOLAM 0.25 MG PO TABS
0.2500 mg | ORAL_TABLET | Freq: Two times a day (BID) | ORAL | Status: DC | PRN
  Administered 2019-06-15: 0.25 mg via ORAL
  Filled 2019-06-14 (×2): qty 1

## 2019-06-14 NOTE — Progress Notes (Signed)
Progress Note  Patient Name: Allison [name] Date of Encounter: 06/14/2019  Primary Cardiologist: new, Dr. Elon Jester  Subjective   No chest pain.  Comfortable in bed.  Inpatient Medications    Scheduled Meds: . amLODipine  5 mg Oral Daily  . aspirin  81 mg Oral Daily  . atorvastatin  80 mg Oral q1800  . cholecalciferol  5,000 Units Oral Daily  . clopidogrel  75 mg Oral Q breakfast  . dorzolamide-timolol  1 drop Both Eyes BID  . ezetimibe  10 mg Oral Daily  . hydrochlorothiazide  12.5 mg Oral Daily  . latanoprost  1 drop Both Eyes QHS  . osimertinib mesylate  80 mg Oral Daily  . sodium chloride flush  3 mL Intravenous Once   Continuous Infusions: . heparin 700 Units/hr (06/12/19 1817)   PRN Meds: acetaminophen, Melatonin, nitroGLYCERIN, ondansetron (ZOFRAN) IV   Vital Signs    Vitals:   06/13/19 0605 06/13/19 2020 06/14/19 0547 06/14/19 0934  BP: (!) 97/57 112/61 (!) 106/59 (!) 110/56  Pulse: 61 70 62   Resp:  18 18   Temp: 98.2 F (36.8 C) 98.3 F (36.8 C) 98.5 F (36.9 C)   TempSrc: Oral Oral Oral   SpO2: 100% 100% 95%   Weight:   59.5 kg   Height:        Intake/Output Summary (Last 24 hours) at 06/14/2019 1040 Last data filed at 06/14/2019 0600 Gross per 24 hour  Intake 429 ml  Output --  Net 429 ml    I/O since admission: +85.3  Filed Weights   06/13/19 0028 06/13/19 0500 06/14/19 0547  Weight: 59.6 kg 59.6 kg 59.5 kg    Telemetry    Sinus rhythm- Personally Reviewed  ECG    ECG sinus rhythm, rate 61 personally reviewed  Physical Exam   BP (!) 110/56 (BP Location: Left Arm)   Pulse 62   Temp 98.5 F (36.9 C) (Oral)   Resp 18   Ht 5\' 5"  (1.651 m)   Wt 59.5 kg   LMP  (LMP Unknown)   SpO2 95%   BMI 21.82 kg/m  GEN: Well nourished, well developed, in no acute distress  HEENT: normal  Neck: no JVD, carotid bruits, or masses Cardiac: RRR; no murmurs, rubs, or gallops,no edema  Respiratory:  clear to auscultation bilaterally,  normal work of breathing GI: soft, nontender, nondistended, + BS MS: no deformity or atrophy  Skin: warm and dry Neuro:  Strength and sensation are intact Psych: euthymic mood, full affect       Labs    Chemistry Recent Labs  Lab 06/12/19 1612 06/13/19 0100 06/13/19 1653 06/14/19 0446  NA 137 140 140 143  K 3.6 3.0* 3.5 4.4  CL 103 103 107 110  CO2 25 28 25 25   GLUCOSE 107* 100* 99 103*  BUN 19 14 14 16   CREATININE 1.03* 0.99 1.09* 1.03*  CALCIUM 9.3 9.4 8.8* 8.9  PROT 7.1  --   --   --   ALBUMIN 3.9  --   --   --   AST 43*  --   --   --   ALT 25  --   --   --   ALKPHOS 56  --   --   --   BILITOT 0.2*  --   --   --   GFRNONAA 56* 59* 52* 56*  GFRAA >60 >60 >60 >60  ANIONGAP 9 9 8  8  Hematology Recent Labs  Lab 06/12/19 1612 06/13/19 0100 06/14/19 0446  WBC 4.8 4.1 4.0  RBC 4.36 4.26 4.03  HGB 11.7* 11.4* 10.8*  HCT 37.2 35.2* 33.7*  MCV 85.3 82.6 83.6  MCH 26.8 26.8 26.8  MCHC 31.5 32.4 32.0  RDW 14.4 14.4 14.3  PLT 203 214 183    Cardiac EnzymesNo results for input(s): TROPONINI in the last 168 hours. No results for input(s): TROPIPOC in the last 168 hours.   HS Troponin: 2355 >> 3526 >> 5696 >> 6668  BNP Recent Labs  Lab 06/13/19 0041  BNP 177.4*     DDimer No results for input(s): DDIMER in the last 168 hours.   Lipid Panel     Component Value Date/Time   CHOL 213 (H) 06/13/2019 0100   TRIG 63 06/13/2019 0100   HDL 52 06/13/2019 0100   CHOLHDL 4.1 06/13/2019 0100   VLDL 13 06/13/2019 0100   LDLCALC 148 (H) 06/13/2019 0100     Radiology    DG Chest 2 View  Result Date: 06/12/2019 CLINICAL DATA:  Chest pain for 1 week, weakness, history hypertension, RIGHT-side lung cancer, Raynaud disease EXAM: CHEST - 2 VIEW COMPARISON:  04/09/2019 FINDINGS: Normal heart size, mediastinal contours, and pulmonary vascularity. RIGHT apical nodule 22 mm long axis, previously 20 mm. Remaining lungs clear. No acute infiltrate, pleural effusion or  pneumothorax. Bones demineralized. IMPRESSION: Persistent RIGHT apical nodule question minimally larger versus projectional difference. No acute abnormalities. Electronically Signed   By: Lavonia Dana M.D.   On: 06/12/2019 16:32   CT Angio Chest PE W and/or Wo Contrast  Result Date: 06/12/2019 CLINICAL DATA:  68 year old female with shortness of breath. History of lung cancer. EXAM: CT ANGIOGRAPHY CHEST WITH CONTRAST TECHNIQUE: Multidetector CT imaging of the chest was performed using the standard protocol during bolus administration of intravenous contrast. Multiplanar CT image reconstructions and MIPs were obtained to evaluate the vascular anatomy. CONTRAST:  143mL OMNIPAQUE IOHEXOL 350 MG/ML SOLN COMPARISON:  Chest radiograph dated 06/12/2019 and CT dated 05/02/2019. FINDINGS: Cardiovascular: There is no cardiomegaly or pericardial effusion. Mild atherosclerotic calcification of the thoracic aorta. No aneurysmal dilatation. Apparent focal filling defect within the right upper lobe pulmonary artery branch (series 5, image 101 and coronal series 7, image 61) is likely artifactual. A nonocclusive embolus is less likely but not excluded. No other acute pulmonary artery embolus identified. Mediastinum/Nodes: There is no hilar or mediastinal adenopathy. The esophagus and the thyroid gland are grossly unremarkable as visualized. No mediastinal fluid collection. Lungs/Pleura: There is a small right pleural effusion similar to prior CT. Diffuse interstitial prominence may represent mild edema. There is a 2.5 x 1.7 cm nodule with irregular and spiculated margins in the right apex which is similar to prior CT. A 1 cm nodular density in the right lower lobe along the major fissure (series 6, image 79) appears similar to prior CT. Overall no interval change in the appearance of the lungs since the prior CT. There is no pneumothorax. The central airways are patent. Upper Abdomen: No acute abnormality. Musculoskeletal: No  chest wall abnormality. No acute or significant osseous findings. Review of the MIP images confirms the above findings. IMPRESSION: 1. Artifact versus less likely nonocclusive thrombus in the right upper lobe pulmonary artery branch. 2. Small right pleural effusion similar to prior CT. 3. Right apical and right lower lobe nodular densities as seen on the prior CT. Overall no interval change in the appearance of the lungs since the prior CT.  4. Aortic Atherosclerosis (ICD10-I70.0). Electronically Signed   By: Anner Crete M.D.   On: 06/12/2019 19:05   ECHOCARDIOGRAM COMPLETE  Result Date: 06/13/2019   ECHOCARDIOGRAM REPORT   Patient Name:   SIRENIA WHITIS Shawhan Date of Exam: 06/13/2019 Medical Rec #:  [id number]       Height:       65.0 in Accession #:    [id number]      Weight:       131.4 lb Date of Birth:  1950/10/16       BSA:          1.65 m Patient Age:    63 years        BP:           97/57 mmHg Patient Gender: F               HR:           55 bpm. Exam Location:  Inpatient Procedure: 2D Echo, Color Doppler and Cardiac Doppler Indications:    NSTEMI  History:        Patient has no prior history of Echocardiogram examinations.                 Risk Factors:Hypertension and Dyslipidemia.  Sonographer:    Raquel Sarna Senior RDCS Referring Phys: [id number] [place]  1. Left ventricular ejection fraction, by visual estimation, is 55 to 60%. The left ventricle has normal function. There is mildly increased left ventricular hypertrophy.  2. The left ventricle has no regional wall motion abnormalities.  3. Global right ventricle has low normal systolic function.The right ventricular size is normal. No increase in right ventricular wall thickness.  4. Left atrial size was normal.  5. Right atrial size was normal.  6. Mild mitral annular calcification.  7. The mitral valve is degenerative. Moderate mitral valve regurgitation.  8. The tricuspid valve is grossly normal.  9. The aortic valve is  tricuspid. Aortic valve regurgitation is not visualized. No evidence of aortic valve sclerosis or stenosis. 10. The pulmonic valve was grossly normal. Pulmonic valve regurgitation is not visualized. 11. Normal pulmonary artery systolic pressure. 12. The inferior vena cava is normal in size with greater than 50% respiratory variability, suggesting right atrial pressure of 3 mmHg. FINDINGS  Left Ventricle: Left ventricular ejection fraction, by visual estimation, is 55 to 60%. The left ventricle has normal function. The left ventricle has no regional wall motion abnormalities. There is mildly increased left ventricular hypertrophy. Asymmetric left ventricular hypertrophy of the posterior wall. Left ventricular diastolic parameters were normal. Right Ventricle: The right ventricular size is normal. No increase in right ventricular wall thickness. Global RV systolic function is has low normal systolic function. The tricuspid regurgitant velocity is 2.35 m/s, and with an assumed right atrial pressure of 3 mmHg, the estimated right ventricular systolic pressure is normal at 25.1 mmHg. Left Atrium: Left atrial size was normal in size. Right Atrium: Right atrial size was normal in size Pericardium: There is no evidence of pericardial effusion. Mitral Valve: The mitral valve is degenerative in appearance. There is mild thickening of the mitral valve leaflet(s). Mild mitral annular calcification. Moderate mitral valve regurgitation. Tricuspid Valve: The tricuspid valve is grossly normal. Tricuspid valve regurgitation moderate. Aortic Valve: The aortic valve is tricuspid. Aortic valve regurgitation is not visualized. The aortic valve is structurally normal, with no evidence of sclerosis or stenosis. Pulmonic Valve: The pulmonic valve was grossly normal. Pulmonic valve regurgitation is not  visualized. Pulmonic regurgitation is not visualized. Aorta: The aortic root is normal in size and structure. Venous: The inferior vena cava  is normal in size with greater than 50% respiratory variability, suggesting right atrial pressure of 3 mmHg. IAS/Shunts: No atrial level shunt detected by color flow Doppler.  LEFT VENTRICLE PLAX 2D LVIDd:         4.00 cm  Diastology LVIDs:         2.80 cm  LV e' lateral:   8.27 cm/s LV PW:         1.10 cm  LV E/e' lateral: 11.0 LV IVS:        0.70 cm  LV e' medial:    8.59 cm/s LVOT diam:     1.60 cm  LV E/e' medial:  10.5 LV SV:         40 ml LV SV Index:   24.44 LVOT Area:     2.01 cm  RIGHT VENTRICLE RV S prime:     9.03 cm/s TAPSE (M-mode): 2.4 cm LEFT ATRIUM             Index       RIGHT ATRIUM           Index LA diam:        3.50 cm 2.12 cm/m  RA Area:     16.10 cm LA Vol (A2C):   60.8 ml 36.74 ml/m RA Volume:   42.60 ml  25.75 ml/m LA Vol (A4C):   44.6 ml 26.95 ml/m LA Biplane Vol: 52.6 ml 31.79 ml/m  AORTIC VALVE LVOT Vmax:   85.80 cm/s LVOT Vmean:  56.700 cm/s LVOT VTI:    0.181 m  AORTA Ao Root diam: 2.30 cm Ao Asc diam:  2.80 cm MITRAL VALVE                        TRICUSPID VALVE MV Area (PHT): 3.77 cm             TR Peak grad:   22.1 mmHg MV PHT:        58.29 msec           TR Vmax:        235.00 cm/s MV Decel Time: 201 msec MR Peak grad:    130.4 mmHg         SHUNTS MR Mean grad:    88.0 mmHg          Systemic VTI:  0.18 m MR Vmax:         571.00 cm/s        Systemic Diam: 1.60 cm MR Vmean:        452.0 cm/s MR PISA:         1.01 cm MR PISA Eff ROA: 5 mm MR PISA Radius:  0.40 cm MV E velocity: 90.60 cm/s 103 cm/s MV A velocity: 51.40 cm/s 70.3 cm/s MV E/A ratio:  1.76       1.5  Kate Sable MD Electronically signed by Kate Sable MD Signature Date/Time: 06/13/2019/9:33:49 AM    Final     Cardiac Studies   CTPA: 1. Artifact versus less likely nonocclusive thrombus in the right upper lobe pulmonary artery branch. 2. Small right pleural effusion similar to prior CT. 3. Right apical and right lower lobe nodular densities as seen on the prior CT. Overall no interval change in  the appearance of the lungs since the prior CT. 4. Aortic Atherosclerosis (ICD10-I70.0).  TTE 06/13/19  1. Left ventricular ejection fraction, by visual estimation, is 55 to 60%. The left ventricle has normal function. There is mildly increased left ventricular hypertrophy.  2. The left ventricle has no regional wall motion abnormalities.  3. Global right ventricle has low normal systolic function.The right ventricular size is normal. No increase in right ventricular wall thickness.  4. Left atrial size was normal.  5. Right atrial size was normal.  6. Mild mitral annular calcification.  7. The mitral valve is degenerative. Moderate mitral valve regurgitation.  8. The tricuspid valve is grossly normal.  9. The aortic valve is tricuspid. Aortic valve regurgitation is not visualized. No evidence of aortic valve sclerosis or stenosis. 10. The pulmonic valve was grossly normal. Pulmonic valve regurgitation is not visualized. 11. Normal pulmonary artery systolic pressure. 12. The inferior vena cava is normal in size with greater than 50% respiratory variability, suggesting right atrial pressure of 3 mmHg.  Patient Profile   Allison [name] is a 68 y.o. female with a hx of stage IV lung cancer, HTN, HLD  who was seen for the evaluation of NSTEMI at the request of Dr Alvino Chapel   Assessment & Plan    1.  NSTEMI: At approximately week of chest pain.  Troponin greater than 9000.  I discussed with her the possibility of left heart catheterization.  She Janah Mcculloh continue to consider this.  If she does wish to have catheterization, Harmonee Tozer likely be on Monday.  Fortunately her echo shows a normal ejection fraction.  We Saturnino Liew continue atorvastatin 80 mg.  Continue heparin drip until she makes a determination of her catheterization status.  2.  Stage IV lung CA: Plan for outpatient oncology  3.  Hyperlipidemia: LDL 148.  Atorvastatin added today.    4.  Essential hypertension: Currently well  controlled  4. Hypokalemia: Has since been repleted  Signed, Allegra Lai, MD 06/14/2019, 10:40 AM

## 2019-06-14 NOTE — Progress Notes (Signed)
ANTICOAGULATION CONSULT NOTE - Follow Up Consult  Pharmacy Consult for Heparin Indication: chest pain/ACS  Allergies  Allergen Reactions  . Penicillins Other (See Comments)    [facility] PATIENT HAS HAD A PCN REACTION WITH IMMEDIATE RASH, FACIAL/TONGUE/THROAT SWELLING, SOB, OR LIGHTHEADEDNESS WITH HYPOTENSION:  #  #  YES  #  # Has patient had a PCN reaction causing severe rash involving mucus membranes or skin necrosis: No Has patient had a PCN reaction that required hospitalization: No Has patient had a PCN reaction occurring within the last 10 years: No   . Vicodin [Hydrocodone-Acetaminophen] Other (See Comments)    Sped up heart and breathing    Patient Measurements: Height: 5\' 5"  (165.1 cm) Weight: 131 lb 1.6 oz (59.5 kg) IBW/kg (Calculated) : 57 Heparin Dosing Weight: 59.6  Vital Signs: Temp: 98.5 F (36.9 C) (12/26 0547) Temp Source: Oral (12/26 0547) BP: 110/56 (12/26 0934) Pulse Rate: 62 (12/26 0547)  Labs: Recent Labs    06/12/19 1612 06/13/19 0041 06/13/19 0100 06/13/19 0153 06/13/19 0207 06/13/19 0909 06/13/19 1304 06/13/19 1653 06/14/19 0446 06/14/19 0928  HGB 11.7*  --  11.4*  --   --   --   --   --  10.8*  --   HCT 37.2  --  35.2*  --   --   --   --   --  33.7*  --   PLT 203  --  214  --   --   --   --   --  183  --   HEPARINUNFRC  --   --   --  0.36  --  0.62  --   --   --  0.64  CREATININE 1.03*  --  0.99  --   --   --   --  1.09* 1.03*  --   TROPONINIHS 2,355* 2,671*  --   --  2,458*  --  9,133*  --   --   --     Estimated Creatinine Clearance: 47 mL/min (A) (by C-G formula based on SCr of 1.03 mg/dL (H)).   Medications:  Scheduled:  . amLODipine  5 mg Oral Daily  . aspirin  81 mg Oral Daily  . atorvastatin  80 mg Oral q1800  . cholecalciferol  5,000 Units Oral Daily  . clopidogrel  75 mg Oral Q breakfast  . dorzolamide-timolol  1 drop Both Eyes BID  . ezetimibe  10 mg Oral Daily  . hydrochlorothiazide  12.5 mg Oral Daily  . latanoprost   1 drop Both Eyes QHS  . osimertinib mesylate  80 mg Oral Daily  . sodium chloride flush  3 mL Intravenous Once   Infusions:  . heparin 700 Units/hr (06/12/19 1817)    Assessment: Patient is a 68 y/o female with history of stage IV NSCLC (DX July 2019 with mediastinal lymphadenopathy and malignant effusion), HTN, and HLD who presents with substernal chest pain radiating to jaw. There is concern for NSTEMI. Patient is not on anticoagulation PTA. Pharmacy has been consulted to dose heparin.   heparin level is therapeutic at 0.64 on heparin rate of 700 units/hr. Troponins remain elevated at 9,133. Hg/Hct are stable at 10.8/33.7   Goal of Therapy:  INR 2-3 Heparin level 0.3-0.7 units/ml Monitor platelets by anticoagulation protocol: Yes   Plan:  Continue heparin infusion at 700 units/hr  Check daily while on heparin Continue to monitor H&H and platelets  Sherren Kerns, PharmD PGY1 [facility] Resident 06/14/2019,10:00 AM

## 2019-06-14 NOTE — Progress Notes (Signed)
Marland Kitchen  PROGRESS NOTE    Allison [name]  IEP:[id number] DOB: 1951/01/12 DOA: 06/12/2019 PCP: [name], MD   Brief Narrative:   Allison [name] is a 68 y.o. female with medical history significant for stage IV non-small cell lung cancer (DX July 2019 with mediastinal lymphadenopathy and malignant effusion), currently on Tagrisso, hypertension, hyperlipidemia who presents with chest pain.  She describes the pain as a sharp, stabbing central sternal chest pain radiating to jaw and shoulders and it is associated with exertion.  She explains that over the last week she has had this intermittent pain, that comes and goes with exertion.  She was feeling better yesterday, and even worked out, the subsequently she developed some exertional dyspnea and pain, progressing today to the point where she felt like she was unable to get around much of the house without experiencing chest pain and dyspnea.  Additionally she noted her heart rate was running in the 50s on her fitness wrist device, which is highly unusual for her. This prompted her to visit the ED.  She has no personal history of coronary disease, though explains 3 of her siblings do.  Outside of exertion, she describes no other exacerbating factors and outside of rest, she describes no other remitting factors. Denies fever, chills, nausea, vomiting, diarrhea, constipation, dysuria, hematuria.   06/14/19: No CP ON. EKG ok. Morning Trp pending. Awaiting cards input. Lab work otherwise ok.    Assessment & Plan:   Active Problems:   NSTEMI (non-ST elevated myocardial infarction) ([place])   Stage 4 malignant neoplasm of lung (HCC)   Hypokalemia  CP     - tele     - cards onboard     - trp trending up (last 9100); trp ordered for this AM     - s/p Echo; results noted     - EKG NSR, no ST changes     - awaiting final cards recs  S4 Lung CA     - outpt f/u w/ heme-onc     - continue tagrisso  Hypokalemia     - resolved  Normocytic  anemia     - stable      - no evidence of bleed; monitor  DVT prophylaxis: heparin Code Status: FULL   Disposition Plan: TBD  Consultants:   Cardiology  ROS:  Denies CP, dyspnea, N, V . Remainder 10-pt ROS is negative for all not previously mentioned.  Subjective: "I was confused by the information in my chart about the sonogram"  Objective: Vitals:   06/13/19 0500 06/13/19 0605 06/13/19 2020 06/14/19 0547  BP:  (!) 97/57 112/61 (!) 106/59  Pulse:  61 70 62  Resp:   18 18  Temp:  98.2 F (36.8 C) 98.3 F (36.8 C) 98.5 F (36.9 C)  TempSrc:  Oral Oral Oral  SpO2:  100% 100% 95%  Weight: 59.6 kg   59.5 kg  Height:        Intake/Output Summary (Last 24 hours) at 06/14/2019 0829 Last data filed at 06/14/2019 0600 Gross per 24 hour  Intake 429 ml  Output --  Net 429 ml   Filed Weights   06/13/19 0028 06/13/19 0500 06/14/19 0547  Weight: 59.6 kg 59.6 kg 59.5 kg    Examination:  General: 68 y.o. female resting in bed in NAD Cardiovascular: RRR, +S1, S2, no m/g/r Respiratory: CTABL, no w/r/r, normal WOB GI: BS+, NDNT, soft MSK: No e/c/c Neuro: A&O x 3, no focal deficits Psyc: Appropriate  interaction and affect, calm/cooperative   Data Reviewed: I have personally reviewed following labs and imaging studies.  CBC: Recent Labs  Lab 06/12/19 1612 06/13/19 0100 06/14/19 0446  WBC 4.8 4.1 4.0  HGB 11.7* 11.4* 10.8*  HCT 37.2 35.2* 33.7*  MCV 85.3 82.6 83.6  PLT 203 214 845   Basic Metabolic Panel: Recent Labs  Lab 06/12/19 1612 06/13/19 0041 06/13/19 0100 06/13/19 1653 06/14/19 0446  NA 137  --  140 140 143  K 3.6  --  3.0* 3.5 4.4  CL 103  --  103 107 110  CO2 25  --  28 25 25   GLUCOSE 107*  --  100* 99 103*  BUN 19  --  14 14 16   CREATININE 1.03*  --  0.99 1.09* 1.03*  CALCIUM 9.3  --  9.4 8.8* 8.9  MG  --  2.3  --   --   --    GFR: Estimated Creatinine Clearance: 47 mL/min (A) (by C-G formula based on SCr of 1.03 mg/dL (H)). Liver  Function Tests: Recent Labs  Lab 06/12/19 1612  AST 43*  ALT 25  ALKPHOS 56  BILITOT 0.2*  PROT 7.1  ALBUMIN 3.9   No results for input(s): LIPASE, AMYLASE in the last 168 hours. No results for input(s): AMMONIA in the last 168 hours. Coagulation Profile: No results for input(s): INR, PROTIME in the last 168 hours. Cardiac Enzymes: No results for input(s): CKTOTAL, CKMB, CKMBINDEX, TROPONINI in the last 168 hours. BNP (last 3 results) No results for input(s): PROBNP in the last 8760 hours. HbA1C: Recent Labs    06/13/19 0041  HGBA1C 5.5   CBG: No results for input(s): GLUCAP in the last 168 hours. Lipid Profile: Recent Labs    06/13/19 0100  CHOL 213*  HDL 52  LDLCALC 148*  TRIG 63  CHOLHDL 4.1   Thyroid Function Tests: Recent Labs    06/13/19 0041  TSH 1.774   Anemia Panel: No results for input(s): VITAMINB12, FOLATE, FERRITIN, TIBC, IRON, RETICCTPCT in the last 72 hours. Sepsis Labs: No results for input(s): PROCALCITON, LATICACIDVEN in the last 168 hours.  Recent Results (from the past 240 hour(s))  Respiratory Panel by RT PCR (Flu A&B, Covid) - Nasopharyngeal Swab     Status: None   Collection Time: 06/12/19  5:39 PM   Specimen: Nasopharyngeal Swab  Result Value Ref Range Status   SARS Coronavirus 2 by RT PCR NEGATIVE NEGATIVE Final    Comment: (NOTE) SARS-CoV-2 target nucleic acids are NOT DETECTED. The SARS-CoV-2 RNA is generally detectable in upper respiratoy specimens during the acute phase of infection. The lowest concentration of SARS-CoV-2 viral copies this assay can detect is 131 copies/mL. A negative result does not preclude SARS-Cov-2 infection and should not be used as the sole basis for treatment or other patient management decisions. A negative result may occur with  improper specimen collection/handling, submission of specimen other than nasopharyngeal swab, presence of viral mutation(s) within the areas targeted by this assay, and  inadequate number of viral copies (<131 copies/mL). A negative result must be combined with clinical observations, patient history, and epidemiological information. The expected result is Negative. Fact Sheet for Patients:  PinkCheek.be Fact Sheet for Healthcare Providers:  GravelBags.it This test is not yet ap proved or cleared by the [place] FDA and  has been authorized for detection and/or diagnosis of SARS-CoV-2 by FDA under an Emergency Use Authorization (EUA). This EUA will remain  in effect (meaning this  test can be used) for the duration of the COVID-19 declaration under Section 564(b)(1) of the Act, 21 U.S.C. section 360bbb-3(b)(1), unless the authorization is terminated or revoked sooner.    Influenza A by PCR NEGATIVE NEGATIVE Final   Influenza B by PCR NEGATIVE NEGATIVE Final    Comment: (NOTE) The Xpert Xpress SARS-CoV-2/FLU/RSV assay is intended as an aid in  the diagnosis of influenza from Nasopharyngeal swab specimens and  should not be used as a sole basis for treatment. Nasal washings and  aspirates are unacceptable for Xpert Xpress SARS-CoV-2/FLU/RSV  testing. Fact Sheet for Patients: PinkCheek.be Fact Sheet for Healthcare Providers: GravelBags.it This test is not yet approved or cleared by the [place] FDA and  has been authorized for detection and/or diagnosis of SARS-CoV-2 by  FDA under an Emergency Use Authorization (EUA). This EUA will remain  in effect (meaning this test can be used) for the duration of the  Covid-19 declaration under Section 564(b)(1) of the Act, 21  U.S.C. section 360bbb-3(b)(1), unless the authorization is  terminated or revoked. Performed at [facility], [place] [address] [place], [place] [address]       Radiology Studies: DG Chest 2 View  Result Date: 06/12/2019 CLINICAL DATA:  Chest  pain for 1 week, weakness, history hypertension, RIGHT-side lung cancer, Raynaud disease EXAM: CHEST - 2 VIEW COMPARISON:  04/09/2019 FINDINGS: Normal heart size, mediastinal contours, and pulmonary vascularity. RIGHT apical nodule 22 mm long axis, previously 20 mm. Remaining lungs clear. No acute infiltrate, pleural effusion or pneumothorax. Bones demineralized. IMPRESSION: Persistent RIGHT apical nodule question minimally larger versus projectional difference. No acute abnormalities. Electronically Signed   By: Lavonia Dana M.D.   On: 06/12/2019 16:32   CT Angio Chest PE W and/or Wo Contrast  Result Date: 06/12/2019 CLINICAL DATA:  68 year old female with shortness of breath. History of lung cancer. EXAM: CT ANGIOGRAPHY CHEST WITH CONTRAST TECHNIQUE: Multidetector CT imaging of the chest was performed using the standard protocol during bolus administration of intravenous contrast. Multiplanar CT image reconstructions and MIPs were obtained to evaluate the vascular anatomy. CONTRAST:  169mL OMNIPAQUE IOHEXOL 350 MG/ML SOLN COMPARISON:  Chest radiograph dated 06/12/2019 and CT dated 05/02/2019. FINDINGS: Cardiovascular: There is no cardiomegaly or pericardial effusion. Mild atherosclerotic calcification of the thoracic aorta. No aneurysmal dilatation. Apparent focal filling defect within the right upper lobe pulmonary artery branch (series 5, image 101 and coronal series 7, image 61) is likely artifactual. A nonocclusive embolus is less likely but not excluded. No other acute pulmonary artery embolus identified. Mediastinum/Nodes: There is no hilar or mediastinal adenopathy. The esophagus and the thyroid gland are grossly unremarkable as visualized. No mediastinal fluid collection. Lungs/Pleura: There is a small right pleural effusion similar to prior CT. Diffuse interstitial prominence may represent mild edema. There is a 2.5 x 1.7 cm nodule with irregular and spiculated margins in the right apex which is  similar to prior CT. A 1 cm nodular density in the right lower lobe along the major fissure (series 6, image 79) appears similar to prior CT. Overall no interval change in the appearance of the lungs since the prior CT. There is no pneumothorax. The central airways are patent. Upper Abdomen: No acute abnormality. Musculoskeletal: No chest wall abnormality. No acute or significant osseous findings. Review of the MIP images confirms the above findings. IMPRESSION: 1. Artifact versus less likely nonocclusive thrombus in the right upper lobe pulmonary artery branch. 2. Small right pleural effusion similar to prior CT.  3. Right apical and right lower lobe nodular densities as seen on the prior CT. Overall no interval change in the appearance of the lungs since the prior CT. 4. Aortic Atherosclerosis (ICD10-I70.0). Electronically Signed   By: Anner Crete M.D.   On: 06/12/2019 19:05   ECHOCARDIOGRAM COMPLETE  Result Date: 06/13/2019   ECHOCARDIOGRAM REPORT   Patient Name:   MARVENA TALLY Santistevan Date of Exam: 06/13/2019 Medical Rec #:  [id number]       Height:       65.0 in Accession #:    [id number]      Weight:       131.4 lb Date of Birth:  Jan 17, 1951       BSA:          1.65 m Patient Age:    21 years        BP:           97/57 mmHg Patient Gender: F               HR:           55 bpm. Exam Location:  Inpatient Procedure: 2D Echo, Color Doppler and Cardiac Doppler Indications:    NSTEMI  History:        Patient has no prior history of Echocardiogram examinations.                 Risk Factors:Hypertension and Dyslipidemia.  Sonographer:    Raquel Sarna Senior RDCS Referring Phys: [id number] [place]  1. Left ventricular ejection fraction, by visual estimation, is 55 to 60%. The left ventricle has normal function. There is mildly increased left ventricular hypertrophy.  2. The left ventricle has no regional wall motion abnormalities.  3. Global right ventricle has low normal systolic function.The right  ventricular size is normal. No increase in right ventricular wall thickness.  4. Left atrial size was normal.  5. Right atrial size was normal.  6. Mild mitral annular calcification.  7. The mitral valve is degenerative. Moderate mitral valve regurgitation.  8. The tricuspid valve is grossly normal.  9. The aortic valve is tricuspid. Aortic valve regurgitation is not visualized. No evidence of aortic valve sclerosis or stenosis. 10. The pulmonic valve was grossly normal. Pulmonic valve regurgitation is not visualized. 11. Normal pulmonary artery systolic pressure. 12. The inferior vena cava is normal in size with greater than 50% respiratory variability, suggesting right atrial pressure of 3 mmHg. FINDINGS  Left Ventricle: Left ventricular ejection fraction, by visual estimation, is 55 to 60%. The left ventricle has normal function. The left ventricle has no regional wall motion abnormalities. There is mildly increased left ventricular hypertrophy. Asymmetric left ventricular hypertrophy of the posterior wall. Left ventricular diastolic parameters were normal. Right Ventricle: The right ventricular size is normal. No increase in right ventricular wall thickness. Global RV systolic function is has low normal systolic function. The tricuspid regurgitant velocity is 2.35 m/s, and with an assumed right atrial pressure of 3 mmHg, the estimated right ventricular systolic pressure is normal at 25.1 mmHg. Left Atrium: Left atrial size was normal in size. Right Atrium: Right atrial size was normal in size Pericardium: There is no evidence of pericardial effusion. Mitral Valve: The mitral valve is degenerative in appearance. There is mild thickening of the mitral valve leaflet(s). Mild mitral annular calcification. Moderate mitral valve regurgitation. Tricuspid Valve: The tricuspid valve is grossly normal. Tricuspid valve regurgitation moderate. Aortic Valve: The aortic valve is tricuspid. Aortic valve regurgitation  is not  visualized. The aortic valve is structurally normal, with no evidence of sclerosis or stenosis. Pulmonic Valve: The pulmonic valve was grossly normal. Pulmonic valve regurgitation is not visualized. Pulmonic regurgitation is not visualized. Aorta: The aortic root is normal in size and structure. Venous: The inferior vena cava is normal in size with greater than 50% respiratory variability, suggesting right atrial pressure of 3 mmHg. IAS/Shunts: No atrial level shunt detected by color flow Doppler.  LEFT VENTRICLE PLAX 2D LVIDd:         4.00 cm  Diastology LVIDs:         2.80 cm  LV e' lateral:   8.27 cm/s LV PW:         1.10 cm  LV E/e' lateral: 11.0 LV IVS:        0.70 cm  LV e' medial:    8.59 cm/s LVOT diam:     1.60 cm  LV E/e' medial:  10.5 LV SV:         40 ml LV SV Index:   24.44 LVOT Area:     2.01 cm  RIGHT VENTRICLE RV S prime:     9.03 cm/s TAPSE (M-mode): 2.4 cm LEFT ATRIUM             Index       RIGHT ATRIUM           Index LA diam:        3.50 cm 2.12 cm/m  RA Area:     16.10 cm LA Vol (A2C):   60.8 ml 36.74 ml/m RA Volume:   42.60 ml  25.75 ml/m LA Vol (A4C):   44.6 ml 26.95 ml/m LA Biplane Vol: 52.6 ml 31.79 ml/m  AORTIC VALVE LVOT Vmax:   85.80 cm/s LVOT Vmean:  56.700 cm/s LVOT VTI:    0.181 m  AORTA Ao Root diam: 2.30 cm Ao Asc diam:  2.80 cm MITRAL VALVE                        TRICUSPID VALVE MV Area (PHT): 3.77 cm             TR Peak grad:   22.1 mmHg MV PHT:        58.29 msec           TR Vmax:        235.00 cm/s MV Decel Time: 201 msec MR Peak grad:    130.4 mmHg         SHUNTS MR Mean grad:    88.0 mmHg          Systemic VTI:  0.18 m MR Vmax:         571.00 cm/s        Systemic Diam: 1.60 cm MR Vmean:        452.0 cm/s MR PISA:         1.01 cm MR PISA Eff ROA: 5 mm MR PISA Radius:  0.40 cm MV E velocity: 90.60 cm/s 103 cm/s MV A velocity: 51.40 cm/s 70.3 cm/s MV E/A ratio:  1.76       1.5  Kate Sable MD Electronically signed by Kate Sable MD Signature Date/Time:  06/13/2019/9:33:49 AM    Final      Scheduled Meds: . amLODipine  5 mg Oral Daily  . aspirin  81 mg Oral Daily  . atorvastatin  80 mg Oral q1800  . clopidogrel  75 mg Oral Q  breakfast  . dorzolamide-timolol  1 drop Both Eyes BID  . ezetimibe  10 mg Oral Daily  . hydrochlorothiazide  12.5 mg Oral Daily  . latanoprost  1 drop Both Eyes QHS  . osimertinib mesylate  80 mg Oral Daily  . sodium chloride flush  3 mL Intravenous Once   Continuous Infusions: . heparin 700 Units/hr (06/12/19 1817)     LOS: 2 days    Time spent: 20 minutes spent in the coordination of care today.    Jonnie Finner, DO [facility]  If 7PM-7AM, please contact night-coverage [email or web address] 06/14/2019, 8:29 AM

## 2019-06-15 DIAGNOSIS — E876 Hypokalemia: Secondary | ICD-10-CM | POA: Diagnosis not present

## 2019-06-15 DIAGNOSIS — R079 Chest pain, unspecified: Secondary | ICD-10-CM | POA: Diagnosis not present

## 2019-06-15 DIAGNOSIS — I214 Non-ST elevation (NSTEMI) myocardial infarction: Secondary | ICD-10-CM | POA: Diagnosis not present

## 2019-06-15 DIAGNOSIS — D649 Anemia, unspecified: Secondary | ICD-10-CM | POA: Diagnosis not present

## 2019-06-15 DIAGNOSIS — C3490 Malignant neoplasm of unspecified part of unspecified bronchus or lung: Secondary | ICD-10-CM | POA: Diagnosis not present

## 2019-06-15 DIAGNOSIS — E559 Vitamin D deficiency, unspecified: Secondary | ICD-10-CM | POA: Diagnosis not present

## 2019-06-15 LAB — BASIC METABOLIC PANEL
Anion gap: 8 (ref 5–15)
BUN: 15 mg/dL (ref 8–23)
CO2: 26 mmol/L (ref 22–32)
Calcium: 9.3 mg/dL (ref 8.9–10.3)
Chloride: 110 mmol/L (ref 98–111)
Creatinine, Ser: 1.04 mg/dL — ABNORMAL HIGH (ref 0.44–1.00)
GFR calc Af Amer: >60 mL/min (ref >60)
GFR calc non Af Amer: 55 mL/min — ABNORMAL LOW (ref >60)
Glucose, Bld: 92 mg/dL (ref 70–99)
Potassium: 4.3 mmol/L (ref 3.5–5.1)
Sodium: 144 mmol/L (ref 135–145)

## 2019-06-15 LAB — CBC
HCT: 34.1 % — ABNORMAL LOW (ref 36.0–46.0)
Hemoglobin: 10.7 g/dL — ABNORMAL LOW (ref 12.0–15.0)
MCH: 26.8 pg (ref 26.0–34.0)
MCHC: 31.4 g/dL (ref 30.0–36.0)
MCV: 85.3 fL (ref 80.0–100.0)
Platelets: 191 10*3/uL (ref 150–400)
RBC: 4 MIL/uL (ref 3.87–5.11)
RDW: 14.6 % (ref 11.5–15.5)
WBC: 3.9 10*3/uL — ABNORMAL LOW (ref 4.0–10.5)
nRBC: 0 % (ref 0.0–0.2)

## 2019-06-15 LAB — HEPARIN LEVEL (UNFRACTIONATED): Heparin Unfractionated: 0.39 IU/mL (ref 0.30–0.70)

## 2019-06-15 MED ORDER — SODIUM CHLORIDE 0.9 % IV SOLN: 250.0000 mL | INTRAVENOUS | Status: DC | PRN

## 2019-06-15 MED ORDER — ASPIRIN 81 MG PO CHEW
81.0000 mg | CHEWABLE_TABLET | ORAL | Status: AC
  Administered 2019-06-16: 81 mg via ORAL
  Filled 2019-06-15: qty 1

## 2019-06-15 MED ORDER — SODIUM CHLORIDE 0.9% FLUSH
3.0000 mL | Freq: Two times a day (BID) | INTRAVENOUS | Status: DC
  Administered 2019-06-16 – 2019-06-17 (×2): 3 mL via INTRAVENOUS

## 2019-06-15 MED ORDER — SODIUM CHLORIDE 0.9% FLUSH: 3.0000 mL | INTRAVENOUS | Status: DC | PRN

## 2019-06-15 MED ORDER — SODIUM CHLORIDE 0.9 % WEIGHT BASED INFUSION
3.0000 mL/kg/h | INTRAVENOUS | Status: DC
  Administered 2019-06-16: 3 mL/kg/h via INTRAVENOUS

## 2019-06-15 MED ORDER — SODIUM CHLORIDE 0.9 % WEIGHT BASED INFUSION: 1.0000 mL/kg/h | INTRAVENOUS | Status: DC

## 2019-06-15 NOTE — Progress Notes (Signed)
Marland Kitchen  PROGRESS NOTE    Allison [name]  XHB:[id number] DOB: December 24, 1950 DOA: 06/12/2019 PCP: [name], MD   Brief Narrative:   Allison Buehler Adamsonis a 68 y.o.femalewith medical history significantfor stage IV non-small cell lung cancer (DX July 2019 with mediastinal lymphadenopathy and malignant effusion), currently on Tagrisso, hypertension, hyperlipidemia who presents with chest pain. She describes the pain as a sharp, stabbing central sternal chest pain radiating to jaw and shoulders and it is associated with exertion. She explains that over the last week she has had this intermittent pain, that comes and goes with exertion. She was feeling better yesterday, and even worked out, the subsequently she developed some exertional dyspnea and pain, progressing today to the point where she felt like she was unable to get around much of the house without experiencing chest pain and dyspnea. Additionally she noted her heart rate was running in the 50s on her fitness wrist device, which is highly unusual for her. This prompted her to visit the ED. She has no personal history of coronary disease, though explains 3 of her siblings do. Outside of exertion, she describes no other exacerbating factors and outside of rest, she describes no other remitting factors. Denies fever, chills, nausea, vomiting, diarrhea, constipation, dysuria, hematuria.  06/15/19: No CP ON. LHC tomorrow. Continue current Tx.   Assessment & Plan:   Active Problems:   NSTEMI (non-ST elevated myocardial infarction) ([place])   Stage 4 malignant neoplasm of lung (HCC)   Hypokalemia  CP     - tele     - cards onboard     - trp trending up (last 9100); trp ordered for this AM     - s/p Echo; results noted     - EKG NSR, no ST changes     - 06/15/19: to have LHC tomorrow, no CP ON. Monitor  S4 Lung CA     - outpt f/u w/ heme-onc     - continue tagrisso     - continue as above  Hypokalemia     - resolved; K+ 4.3,  monitor  Normocytic anemia     - stable      - no evidence of bleed; monitor     - Hgb is stable at 10.7  DVT prophylaxis: heparin Code Status: FULL   Disposition Plan: TBD  ROS:  Denies CP, dyspnea, HA, weakness, dizzines, N, V . Remainder 10-pt ROS is negative for all not previously mentioned.  Subjective: "I talked to several coaches."  Objective: Vitals:   06/14/19 1450 06/14/19 1959 06/15/19 0655 06/15/19 1032  BP: 103/60 126/68 123/62 113/65  Pulse: (!) 59 66 84   Resp:   20   Temp: 97.6 F (36.4 C) 98.3 F (36.8 C) 98 F (36.7 C)   TempSrc: Oral Oral Oral   SpO2: 100% 100% 100%   Weight:   59.6 kg   Height:        Intake/Output Summary (Last 24 hours) at 06/15/2019 1452 Last data filed at 06/15/2019 0400 Gross per 24 hour  Intake 394 ml  Output --  Net 394 ml   Filed Weights   06/13/19 0500 06/14/19 0547 06/15/19 0655  Weight: 59.6 kg 59.5 kg 59.6 kg    Examination:  General: 68 y.o. female resting in bed in NAD Cardiovascular: RRR, +S1, S2, no m/g/r, equal pulses throughout Respiratory: CTABL, no w/r/r, normal WOB GI: BS+, NDNT, no masses noted, no organomegaly noted MSK: No e/c/c Skin: No rashes, bruises, ulcerations  noted Neuro: alert to name, follows commands Psyc: calm/cooperative   Data Reviewed: I have personally reviewed following labs and imaging studies.  CBC: Recent Labs  Lab 06/12/19 1612 06/13/19 0100 06/14/19 0446 06/15/19 0329  WBC 4.8 4.1 4.0 3.9*  HGB 11.7* 11.4* 10.8* 10.7*  HCT 37.2 35.2* 33.7* 34.1*  MCV 85.3 82.6 83.6 85.3  PLT 203 214 183 188   Basic Metabolic Panel: Recent Labs  Lab 06/12/19 1612 06/13/19 0041 06/13/19 0100 06/13/19 1653 06/14/19 0446 06/15/19 0329  NA 137  --  140 140 143 144  K 3.6  --  3.0* 3.5 4.4 4.3  CL 103  --  103 107 110 110  CO2 25  --  28 25 25 26   GLUCOSE 107*  --  100* 99 103* 92  BUN 19  --  14 14 16 15   CREATININE 1.03*  --  0.99 1.09* 1.03* 1.04*  CALCIUM 9.3  --   9.4 8.8* 8.9 9.3  MG  --  2.3  --   --   --   --    GFR: Estimated Creatinine Clearance: 46.6 mL/min (A) (by C-G formula based on SCr of 1.04 mg/dL (H)). Liver Function Tests: Recent Labs  Lab 06/12/19 1612  AST 43*  ALT 25  ALKPHOS 56  BILITOT 0.2*  PROT 7.1  ALBUMIN 3.9   No results for input(s): LIPASE, AMYLASE in the last 168 hours. No results for input(s): AMMONIA in the last 168 hours. Coagulation Profile: No results for input(s): INR, PROTIME in the last 168 hours. Cardiac Enzymes: No results for input(s): CKTOTAL, CKMB, CKMBINDEX, TROPONINI in the last 168 hours. BNP (last 3 results) No results for input(s): PROBNP in the last 8760 hours. HbA1C: Recent Labs    06/13/19 0041  HGBA1C 5.5   CBG: No results for input(s): GLUCAP in the last 168 hours. Lipid Profile: Recent Labs    06/13/19 0100  CHOL 213*  HDL 52  LDLCALC 148*  TRIG 63  CHOLHDL 4.1   Thyroid Function Tests: Recent Labs    06/13/19 0041  TSH 1.774   Anemia Panel: No results for input(s): VITAMINB12, FOLATE, FERRITIN, TIBC, IRON, RETICCTPCT in the last 72 hours. Sepsis Labs: No results for input(s): PROCALCITON, LATICACIDVEN in the last 168 hours.  Recent Results (from the past 240 hour(s))  Respiratory Panel by RT PCR (Flu A&B, Covid) - Nasopharyngeal Swab     Status: None   Collection Time: 06/12/19  5:39 PM   Specimen: Nasopharyngeal Swab  Result Value Ref Range Status   SARS Coronavirus 2 by RT PCR NEGATIVE NEGATIVE Final    Comment: (NOTE) SARS-CoV-2 target nucleic acids are NOT DETECTED. The SARS-CoV-2 RNA is generally detectable in upper respiratoy specimens during the acute phase of infection. The lowest concentration of SARS-CoV-2 viral copies this assay can detect is 131 copies/mL. A negative result does not preclude SARS-Cov-2 infection and should not be used as the sole basis for treatment or other patient management decisions. A negative result may occur with  improper  specimen collection/handling, submission of specimen other than nasopharyngeal swab, presence of viral mutation(s) within the areas targeted by this assay, and inadequate number of viral copies (<131 copies/mL). A negative result must be combined with clinical observations, patient history, and epidemiological information. The expected result is Negative. Fact Sheet for Patients:  PinkCheek.be Fact Sheet for Healthcare Providers:  GravelBags.it This test is not yet ap proved or cleared by the [place] FDA and  has  been authorized for detection and/or diagnosis of SARS-CoV-2 by FDA under an Emergency Use Authorization (EUA). This EUA will remain  in effect (meaning this test can be used) for the duration of the COVID-19 declaration under Section 564(b)(1) of the Act, 21 U.S.C. section 360bbb-3(b)(1), unless the authorization is terminated or revoked sooner.    Influenza A by PCR NEGATIVE NEGATIVE Final   Influenza B by PCR NEGATIVE NEGATIVE Final    Comment: (NOTE) The Xpert Xpress SARS-CoV-2/FLU/RSV assay is intended as an aid in  the diagnosis of influenza from Nasopharyngeal swab specimens and  should not be used as a sole basis for treatment. Nasal washings and  aspirates are unacceptable for Xpert Xpress SARS-CoV-2/FLU/RSV  testing. Fact Sheet for Patients: PinkCheek.be Fact Sheet for Healthcare Providers: GravelBags.it This test is not yet approved or cleared by the [place] FDA and  has been authorized for detection and/or diagnosis of SARS-CoV-2 by  FDA under an Emergency Use Authorization (EUA). This EUA will remain  in effect (meaning this test can be used) for the duration of the  Covid-19 declaration under Section 564(b)(1) of the Act, 21  U.S.C. section 360bbb-3(b)(1), unless the authorization is  terminated or revoked. Performed at [facility], [place] [address], [place], [place] [address]       Radiology Studies: No results found.   Scheduled Meds: . amLODipine  5 mg Oral Daily  . aspirin  81 mg Oral Daily  . atorvastatin  80 mg Oral q1800  . cholecalciferol  5,000 Units Oral Daily  . clopidogrel  75 mg Oral Q breakfast  . dorzolamide-timolol  1 drop Both Eyes BID  . ezetimibe  10 mg Oral Daily  . hydrochlorothiazide  12.5 mg Oral Daily  . latanoprost  1 drop Both Eyes QHS  . osimertinib mesylate  80 mg Oral Daily  . sodium chloride flush  3 mL Intravenous Once  . sodium chloride flush  3 mL Intravenous Q12H   Continuous Infusions: . heparin 750 Units/hr (06/15/19 1028)     LOS: 3 days    Time spent: 25 minutes spent in the coordination of care today.    Jonnie Finner, DO [facility]  If 7PM-7AM, please contact night-coverage [email or web address] 06/15/2019, 2:52 PM

## 2019-06-15 NOTE — H&P (View-Only) (Signed)
Progress Note  Patient Name: Allison [name] Date of Encounter: 06/15/2019  Primary Cardiologist: new, Dr. Elon Jester  Subjective   Currently feeling well without chest pain or shortness of breath  Inpatient Medications    Scheduled Meds: . amLODipine  5 mg Oral Daily  . aspirin  81 mg Oral Daily  . atorvastatin  80 mg Oral q1800  . cholecalciferol  5,000 Units Oral Daily  . clopidogrel  75 mg Oral Q breakfast  . dorzolamide-timolol  1 drop Both Eyes BID  . ezetimibe  10 mg Oral Daily  . hydrochlorothiazide  12.5 mg Oral Daily  . latanoprost  1 drop Both Eyes QHS  . osimertinib mesylate  80 mg Oral Daily  . sodium chloride flush  3 mL Intravenous Once   Continuous Infusions: . heparin 700 Units/hr (06/12/19 1817)   PRN Meds: acetaminophen, ALPRAZolam, Melatonin, nitroGLYCERIN, ondansetron (ZOFRAN) IV   Vital Signs    Vitals:   06/14/19 0934 06/14/19 1450 06/14/19 1959 06/15/19 0655  BP: (!) 110/56 103/60 126/68 123/62  Pulse:  (!) 59 66 84  Resp:    20  Temp:  97.6 F (36.4 C) 98.3 F (36.8 C) 98 F (36.7 C)  TempSrc:  Oral Oral Oral  SpO2:  100% 100% 100%  Weight:    59.6 kg  Height:        Intake/Output Summary (Last 24 hours) at 06/15/2019 0849 Last data filed at 06/15/2019 0400 Gross per 24 hour  Intake 644 ml  Output --  Net 644 ml    I/O since admission: +85.3  Filed Weights   06/13/19 0500 06/14/19 0547 06/15/19 0655  Weight: 59.6 kg 59.5 kg 59.6 kg    Telemetry    Sinus rhythm- Personally Reviewed  ECG    ECG sinus rhythm, rate 61 personally reviewed  Physical Exam   BP 123/62 (BP Location: Left Arm)   Pulse 84   Temp 98 F (36.7 C) (Oral)   Resp 20   Ht 5\' 5"  (1.651 m)   Wt 59.6 kg   LMP  (LMP Unknown)   SpO2 100%   BMI 21.88 kg/m  GEN: Well nourished, well developed, in no acute distress  HEENT: normal  Neck: no JVD, carotid bruits, or masses Cardiac: RRR; no murmurs, rubs, or gallops,no edema  Respiratory:  clear to  auscultation bilaterally, normal work of breathing GI: soft, nontender, nondistended, + BS MS: no deformity or atrophy  Skin: warm and dry Neuro:  Strength and sensation are intact Psych: euthymic mood, full affect       Labs    Chemistry Recent Labs  Lab 06/12/19 1612 06/13/19 1653 06/14/19 0446 06/15/19 0329  NA 137 140 143 144  K 3.6 3.5 4.4 4.3  CL 103 107 110 110  CO2 25 25 25 26   GLUCOSE 107* 99 103* 92  BUN 19 14 16 15   CREATININE 1.03* 1.09* 1.03* 1.04*  CALCIUM 9.3 8.8* 8.9 9.3  PROT 7.1  --   --   --   ALBUMIN 3.9  --   --   --   AST 43*  --   --   --   ALT 25  --   --   --   ALKPHOS 56  --   --   --   BILITOT 0.2*  --   --   --   GFRNONAA 56* 52* 56* 55*  GFRAA >60 >60 >60 >60  ANIONGAP 9 8 8  8  Hematology Recent Labs  Lab 06/13/19 0100 06/14/19 0446 06/15/19 0329  WBC 4.1 4.0 3.9*  RBC 4.26 4.03 4.00  HGB 11.4* 10.8* 10.7*  HCT 35.2* 33.7* 34.1*  MCV 82.6 83.6 85.3  MCH 26.8 26.8 26.8  MCHC 32.4 32.0 31.4  RDW 14.4 14.3 14.6  PLT 214 183 191    Cardiac EnzymesNo results for input(s): TROPONINI in the last 168 hours. No results for input(s): TROPIPOC in the last 168 hours.   HS Troponin: 2355 >> 3526 >> 5696 >> 6668  BNP Recent Labs  Lab 06/13/19 0041  BNP 177.4*     DDimer No results for input(s): DDIMER in the last 168 hours.   Lipid Panel     Component Value Date/Time   CHOL 213 (H) 06/13/2019 0100   TRIG 63 06/13/2019 0100   HDL 52 06/13/2019 0100   CHOLHDL 4.1 06/13/2019 0100   VLDL 13 06/13/2019 0100   LDLCALC 148 (H) 06/13/2019 0100     Radiology    No results found.  Cardiac Studies   CTPA: 1. Artifact versus less likely nonocclusive thrombus in the right upper lobe pulmonary artery branch. 2. Small right pleural effusion similar to prior CT. 3. Right apical and right lower lobe nodular densities as seen on the prior CT. Overall no interval change in the appearance of the lungs since the prior CT. 4.  Aortic Atherosclerosis (ICD10-I70.0).  TTE 06/13/19  1. Left ventricular ejection fraction, by visual estimation, is 55 to 60%. The left ventricle has normal function. There is mildly increased left ventricular hypertrophy.  2. The left ventricle has no regional wall motion abnormalities.  3. Global right ventricle has low normal systolic function.The right ventricular size is normal. No increase in right ventricular wall thickness.  4. Left atrial size was normal.  5. Right atrial size was normal.  6. Mild mitral annular calcification.  7. The mitral valve is degenerative. Moderate mitral valve regurgitation.  8. The tricuspid valve is grossly normal.  9. The aortic valve is tricuspid. Aortic valve regurgitation is not visualized. No evidence of aortic valve sclerosis or stenosis. 10. The pulmonic valve was grossly normal. Pulmonic valve regurgitation is not visualized. 11. Normal pulmonary artery systolic pressure. 12. The inferior vena cava is normal in size with greater than 50% respiratory variability, suggesting right atrial pressure of 3 mmHg.  Patient Profile   Allison [name] is a 68 y.o. female with a hx of stage IV lung cancer, HTN, HLD  who was seen for the evaluation of NSTEMI at the request of Dr Alvino Chapel   Assessment & Plan    1.  NSTEMI: Had 1 week of chest pain.  Troponin greater than 9000.  We [name] plan for left heart catheterization tomorrow.   2.  Stage IV lung CA: Plan per outpatient oncology  3.  Hyperlipidemia: LDL 148.  Currently on atorvastatin which is new this admission  4.  Essential hypertension: Currently well controlled   Signed, Mel Tadros Curt Bears, MD 06/15/2019, 8:49 AM

## 2019-06-15 NOTE — Progress Notes (Signed)
Progress Note  Patient Name: Allison [name] Date of Encounter: 06/15/2019  Primary Cardiologist: new, Dr. Elon Jester  Subjective   Currently feeling well without chest pain or shortness of breath  Inpatient Medications    Scheduled Meds: . amLODipine  5 mg Oral Daily  . aspirin  81 mg Oral Daily  . atorvastatin  80 mg Oral q1800  . cholecalciferol  5,000 Units Oral Daily  . clopidogrel  75 mg Oral Q breakfast  . dorzolamide-timolol  1 drop Both Eyes BID  . ezetimibe  10 mg Oral Daily  . hydrochlorothiazide  12.5 mg Oral Daily  . latanoprost  1 drop Both Eyes QHS  . osimertinib mesylate  80 mg Oral Daily  . sodium chloride flush  3 mL Intravenous Once   Continuous Infusions: . heparin 700 Units/hr (06/12/19 1817)   PRN Meds: acetaminophen, ALPRAZolam, Melatonin, nitroGLYCERIN, ondansetron (ZOFRAN) IV   Vital Signs    Vitals:   06/14/19 0934 06/14/19 1450 06/14/19 1959 06/15/19 0655  BP: (!) 110/56 103/60 126/68 123/62  Pulse:  (!) 59 66 84  Resp:    20  Temp:  97.6 F (36.4 C) 98.3 F (36.8 C) 98 F (36.7 C)  TempSrc:  Oral Oral Oral  SpO2:  100% 100% 100%  Weight:    59.6 kg  Height:        Intake/Output Summary (Last 24 hours) at 06/15/2019 0849 Last data filed at 06/15/2019 0400 Gross per 24 hour  Intake 644 ml  Output --  Net 644 ml    I/O since admission: +85.3  Filed Weights   06/13/19 0500 06/14/19 0547 06/15/19 0655  Weight: 59.6 kg 59.5 kg 59.6 kg    Telemetry    Sinus rhythm- Personally Reviewed  ECG    ECG sinus rhythm, rate 61 personally reviewed  Physical Exam   BP 123/62 (BP Location: Left Arm)   Pulse 84   Temp 98 F (36.7 C) (Oral)   Resp 20   Ht 5\' 5"  (1.651 m)   Wt 59.6 kg   LMP  (LMP Unknown)   SpO2 100%   BMI 21.88 kg/m  GEN: Well nourished, well developed, in no acute distress  HEENT: normal  Neck: no JVD, carotid bruits, or masses Cardiac: RRR; no murmurs, rubs, or gallops,no edema  Respiratory:  clear to  auscultation bilaterally, normal work of breathing GI: soft, nontender, nondistended, + BS MS: no deformity or atrophy  Skin: warm and dry Neuro:  Strength and sensation are intact Psych: euthymic mood, full affect       Labs    Chemistry Recent Labs  Lab 06/12/19 1612 06/13/19 1653 06/14/19 0446 06/15/19 0329  NA 137 140 143 144  K 3.6 3.5 4.4 4.3  CL 103 107 110 110  CO2 25 25 25 26   GLUCOSE 107* 99 103* 92  BUN 19 14 16 15   CREATININE 1.03* 1.09* 1.03* 1.04*  CALCIUM 9.3 8.8* 8.9 9.3  PROT 7.1  --   --   --   ALBUMIN 3.9  --   --   --   AST 43*  --   --   --   ALT 25  --   --   --   ALKPHOS 56  --   --   --   BILITOT 0.2*  --   --   --   GFRNONAA 56* 52* 56* 55*  GFRAA >60 >60 >60 >60  ANIONGAP 9 8 8  8  Hematology Recent Labs  Lab 06/13/19 0100 06/14/19 0446 06/15/19 0329  WBC 4.1 4.0 3.9*  RBC 4.26 4.03 4.00  HGB 11.4* 10.8* 10.7*  HCT 35.2* 33.7* 34.1*  MCV 82.6 83.6 85.3  MCH 26.8 26.8 26.8  MCHC 32.4 32.0 31.4  RDW 14.4 14.3 14.6  PLT 214 183 191    Cardiac EnzymesNo results for input(s): TROPONINI in the last 168 hours. No results for input(s): TROPIPOC in the last 168 hours.   HS Troponin: 2355 >> 3526 >> 5696 >> 6668  BNP Recent Labs  Lab 06/13/19 0041  BNP 177.4*     DDimer No results for input(s): DDIMER in the last 168 hours.   Lipid Panel     Component Value Date/Time   CHOL 213 (H) 06/13/2019 0100   TRIG 63 06/13/2019 0100   HDL 52 06/13/2019 0100   CHOLHDL 4.1 06/13/2019 0100   VLDL 13 06/13/2019 0100   LDLCALC 148 (H) 06/13/2019 0100     Radiology    No results found.  Cardiac Studies   CTPA: 1. Artifact versus less likely nonocclusive thrombus in the right upper lobe pulmonary artery branch. 2. Small right pleural effusion similar to prior CT. 3. Right apical and right lower lobe nodular densities as seen on the prior CT. Overall no interval change in the appearance of the lungs since the prior CT. 4.  Aortic Atherosclerosis (ICD10-I70.0).  TTE 06/13/19  1. Left ventricular ejection fraction, by visual estimation, is 55 to 60%. The left ventricle has normal function. There is mildly increased left ventricular hypertrophy.  2. The left ventricle has no regional wall motion abnormalities.  3. Global right ventricle has low normal systolic function.The right ventricular size is normal. No increase in right ventricular wall thickness.  4. Left atrial size was normal.  5. Right atrial size was normal.  6. Mild mitral annular calcification.  7. The mitral valve is degenerative. Moderate mitral valve regurgitation.  8. The tricuspid valve is grossly normal.  9. The aortic valve is tricuspid. Aortic valve regurgitation is not visualized. No evidence of aortic valve sclerosis or stenosis. 10. The pulmonic valve was grossly normal. Pulmonic valve regurgitation is not visualized. 11. Normal pulmonary artery systolic pressure. 12. The inferior vena cava is normal in size with greater than 50% respiratory variability, suggesting right atrial pressure of 3 mmHg.  Patient Profile   Allison [name] is a 68 y.o. female with a hx of stage IV lung cancer, HTN, HLD  who was seen for the evaluation of NSTEMI at the request of Dr Alvino Chapel   Assessment & Plan    1.  NSTEMI: Had 1 week of chest pain.  Troponin greater than 9000.  We Cherrelle Plante plan for left heart catheterization tomorrow.   2.  Stage IV lung CA: Plan per outpatient oncology  3.  Hyperlipidemia: LDL 148.  Currently on atorvastatin which is new this admission  4.  Essential hypertension: Currently well controlled   Signed, Shateria Paternostro Curt Bears, MD 06/15/2019, 8:49 AM

## 2019-06-15 NOTE — Progress Notes (Signed)
ANTICOAGULATION CONSULT NOTE - Follow Up Consult  Pharmacy Consult for Heparin Indication: chest pain/ACS  Allergies  Allergen Reactions  . Penicillins Other (See Comments)    [facility] PATIENT HAS HAD A PCN REACTION WITH IMMEDIATE RASH, FACIAL/TONGUE/THROAT SWELLING, SOB, OR LIGHTHEADEDNESS WITH HYPOTENSION:  #  #  YES  #  # Has patient had a PCN reaction causing severe rash involving mucus membranes or skin necrosis: No Has patient had a PCN reaction that required hospitalization: No Has patient had a PCN reaction occurring within the last 10 years: No   . Vicodin [Hydrocodone-Acetaminophen] Other (See Comments)    Sped up heart and breathing    Patient Measurements: Height: 5\' 5"  (165.1 cm) Weight: 131 lb 8 oz (59.6 kg) IBW/kg (Calculated) : 57 Heparin Dosing Weight: 59.6  Vital Signs: Temp: 98 F (36.7 C) (12/27 0655) Temp Source: Oral (12/27 0655) BP: 123/62 (12/27 0655) Pulse Rate: 84 (12/27 0655)  Labs: Recent Labs    06/13/19 0100 06/13/19 0207 06/13/19 0909 06/13/19 1304 06/13/19 1653 06/14/19 0446 06/14/19 0928 06/15/19 0329  HGB 11.4*  --   --   --   --  10.8*  --  10.7*  HCT 35.2*  --   --   --   --  33.7*  --  34.1*  PLT 214  --   --   --   --  183  --  191  HEPARINUNFRC  --   --  0.62  --   --   --  0.64 0.39  CREATININE 0.99  --   --   --  1.09* 1.03*  --  1.04*  TROPONINIHS  --  6,269*  --  9,133*  --   --  8,864*  --     Estimated Creatinine Clearance: 46.6 mL/min (A) (by C-G formula based on SCr of 1.04 mg/dL (H)).   Medications:  Scheduled:  . amLODipine  5 mg Oral Daily  . aspirin  81 mg Oral Daily  . atorvastatin  80 mg Oral q1800  . cholecalciferol  5,000 Units Oral Daily  . clopidogrel  75 mg Oral Q breakfast  . dorzolamide-timolol  1 drop Both Eyes BID  . ezetimibe  10 mg Oral Daily  . hydrochlorothiazide  12.5 mg Oral Daily  . latanoprost  1 drop Both Eyes QHS  . osimertinib mesylate  80 mg Oral Daily  . sodium chloride flush  3  mL Intravenous Once   Infusions:  . heparin 700 Units/hr (06/12/19 1817)    Assessment: Patient is a 68 y/o female with history of stage IV NSCLC (DX July 2019 with mediastinal lymphadenopathy and malignant effusion), HTN, and HLD who presents with substernal chest pain radiating to jaw. There is concern for NSTEMI. Patient is not on anticoagulation PTA. Pharmacy has been consulted to dose heparin.   Heparin remains therapeutic but on low end of range. H&H slightly down from admission with no issues with infusion or bleeding per RN. Will increase heparin rate to maintain therapeutic range.   Goal of Therapy:  INR 2-3 Heparin level 0.3-0.7 units/ml Monitor platelets by anticoagulation protocol: Yes   Plan:  Increase heparin infusion at 750 units/hr  Check daily while on heparin Continue to monitor H&H and platelets     Thank you for allowing pharmacy to participate in this patient's care.  Jaymarion Trombly L. Devin Going, [place] PGY1 Pharmacy Resident [phone] 06/15/19      7:47 AM  Please check AMION for all [place]  phone numbers After 10:00 PM, call the [place] [phone]

## 2019-06-16 ENCOUNTER — Inpatient Hospital Stay: Payer: Medicare HMO | Attending: Internal Medicine | Admitting: Internal Medicine

## 2019-06-16 ENCOUNTER — Inpatient Hospital Stay (HOSPITAL_COMMUNITY)
Admission: EM | Disposition: A | Discharge status: Home / Self Care | Payer: Self-pay | Source: Home / Self Care | Attending: Internal Medicine

## 2019-06-16 ENCOUNTER — Inpatient Hospital Stay: Payer: Medicare HMO

## 2019-06-16 ENCOUNTER — Encounter: Payer: Self-pay | Admitting: Internal Medicine

## 2019-06-16 DIAGNOSIS — E876 Hypokalemia: Secondary | ICD-10-CM | POA: Diagnosis not present

## 2019-06-16 DIAGNOSIS — Z7982 Long term (current) use of aspirin: Secondary | ICD-10-CM | POA: Diagnosis not present

## 2019-06-16 DIAGNOSIS — I2510 Atherosclerotic heart disease of native coronary artery without angina pectoris: Secondary | ICD-10-CM | POA: Diagnosis not present

## 2019-06-16 DIAGNOSIS — C3490 Malignant neoplasm of unspecified part of unspecified bronchus or lung: Secondary | ICD-10-CM | POA: Diagnosis not present

## 2019-06-16 DIAGNOSIS — Z90710 Acquired absence of both cervix and uterus: Secondary | ICD-10-CM | POA: Diagnosis not present

## 2019-06-16 DIAGNOSIS — D649 Anemia, unspecified: Secondary | ICD-10-CM | POA: Diagnosis not present

## 2019-06-16 DIAGNOSIS — Z20828 Contact with and (suspected) exposure to other viral communicable diseases: Secondary | ICD-10-CM | POA: Diagnosis not present

## 2019-06-16 DIAGNOSIS — Z79899 Other long term (current) drug therapy: Secondary | ICD-10-CM | POA: Diagnosis not present

## 2019-06-16 DIAGNOSIS — I10 Essential (primary) hypertension: Secondary | ICD-10-CM | POA: Diagnosis not present

## 2019-06-16 DIAGNOSIS — Z8249 Family history of ischemic heart disease and other diseases of the circulatory system: Secondary | ICD-10-CM | POA: Diagnosis not present

## 2019-06-16 DIAGNOSIS — E559 Vitamin D deficiency, unspecified: Secondary | ICD-10-CM | POA: Diagnosis not present

## 2019-06-16 DIAGNOSIS — E785 Hyperlipidemia, unspecified: Secondary | ICD-10-CM | POA: Diagnosis not present

## 2019-06-16 DIAGNOSIS — Z801 Family history of malignant neoplasm of trachea, bronchus and lung: Secondary | ICD-10-CM | POA: Diagnosis not present

## 2019-06-16 DIAGNOSIS — I214 Non-ST elevation (NSTEMI) myocardial infarction: Secondary | ICD-10-CM | POA: Diagnosis not present

## 2019-06-16 DIAGNOSIS — R079 Chest pain, unspecified: Secondary | ICD-10-CM | POA: Diagnosis not present

## 2019-06-16 HISTORY — PX: LEFT HEART CATH AND CORONARY ANGIOGRAPHY: CATH118249

## 2019-06-16 HISTORY — PX: CORONARY STENT INTERVENTION: CATH118234

## 2019-06-16 LAB — CBC WITH DIFFERENTIAL/PLATELET
Abs Immature Granulocytes: 0.01 10*3/uL (ref 0.00–0.07)
Basophils Absolute: 0 10*3/uL (ref 0.0–0.1)
Basophils Relative: 1 %
Eosinophils Absolute: 0.1 10*3/uL (ref 0.0–0.5)
Eosinophils Relative: 3 %
HCT: 34.8 % — ABNORMAL LOW (ref 36.0–46.0)
Hemoglobin: 11.1 g/dL — ABNORMAL LOW (ref 12.0–15.0)
Immature Granulocytes: 0 %
Lymphocytes Relative: 41 %
Lymphs Abs: 1.6 10*3/uL (ref 0.7–4.0)
MCH: 26.4 pg (ref 26.0–34.0)
MCHC: 31.9 g/dL (ref 30.0–36.0)
MCV: 82.7 fL (ref 80.0–100.0)
Monocytes Absolute: 0.4 10*3/uL (ref 0.1–1.0)
Monocytes Relative: 11 %
Neutro Abs: 1.8 10*3/uL (ref 1.7–7.7)
Neutrophils Relative %: 44 %
Platelets: 171 10*3/uL (ref 150–400)
RBC: 4.21 MIL/uL (ref 3.87–5.11)
RDW: 14.4 % (ref 11.5–15.5)
WBC: 4 10*3/uL (ref 4.0–10.5)
nRBC: 0 % (ref 0.0–0.2)

## 2019-06-16 LAB — RENAL FUNCTION PANEL
Albumin: 3.2 g/dL — ABNORMAL LOW (ref 3.5–5.0)
Anion gap: 9 (ref 5–15)
BUN: 11 mg/dL (ref 8–23)
CO2: 23 mmol/L (ref 22–32)
Calcium: 9.3 mg/dL (ref 8.9–10.3)
Chloride: 107 mmol/L (ref 98–111)
Creatinine, Ser: 0.98 mg/dL (ref 0.44–1.00)
GFR calc Af Amer: >60 mL/min (ref >60)
GFR calc non Af Amer: 59 mL/min — ABNORMAL LOW (ref >60)
Glucose, Bld: 98 mg/dL (ref 70–99)
Phosphorus: 4.3 mg/dL (ref 2.5–4.6)
Potassium: 3.8 mmol/L (ref 3.5–5.1)
Sodium: 139 mmol/L (ref 135–145)

## 2019-06-16 LAB — MAGNESIUM: Magnesium: 1.8 mg/dL (ref 1.7–2.4)

## 2019-06-16 LAB — POCT ACTIVATED CLOTTING TIME: Activated Clotting Time: 301 seconds

## 2019-06-16 LAB — HEPARIN LEVEL (UNFRACTIONATED): Heparin Unfractionated: 0.48 IU/mL (ref 0.30–0.70)

## 2019-06-16 SURGERY — LEFT HEART CATH AND CORONARY ANGIOGRAPHY
Anesthesia: LOCAL

## 2019-06-16 MED ORDER — CLOPIDOGREL BISULFATE 75 MG PO TABS
75.0000 mg | ORAL_TABLET | Freq: Every day | ORAL | Status: DC
  Administered 2019-06-17: 75 mg via ORAL
  Filled 2019-06-16: qty 1

## 2019-06-16 MED ORDER — MIDAZOLAM HCL 2 MG/2ML IJ SOLN
INTRAMUSCULAR | Status: DC | PRN
  Administered 2019-06-16: 2 mg via INTRAVENOUS
  Administered 2019-06-16: 1 mg via INTRAVENOUS

## 2019-06-16 MED ORDER — LIDOCAINE HCL (PF) 1 % IJ SOLN
INTRAMUSCULAR | Status: AC
  Filled 2019-06-16: qty 30

## 2019-06-16 MED ORDER — ONDANSETRON HCL 4 MG/2ML IJ SOLN: 4.0000 mg | Freq: Four times a day (QID) | INTRAMUSCULAR | Status: DC | PRN

## 2019-06-16 MED ORDER — FENTANYL CITRATE (PF) 100 MCG/2ML IJ SOLN
INTRAMUSCULAR | Status: AC
  Filled 2019-06-16: qty 2

## 2019-06-16 MED ORDER — SODIUM CHLORIDE 0.9% FLUSH
3.0000 mL | Freq: Two times a day (BID) | INTRAVENOUS | Status: DC
  Administered 2019-06-16: 15:00:00 3 mL via INTRAVENOUS

## 2019-06-16 MED ORDER — HEPARIN (PORCINE) IN NACL 1000-0.9 UT/500ML-% IV SOLN
INTRAVENOUS | Status: DC | PRN
  Administered 2019-06-16 (×2): 500 mL

## 2019-06-16 MED ORDER — FAMOTIDINE IN NACL 20-0.9 MG/50ML-% IV SOLN
INTRAVENOUS | Status: AC
  Filled 2019-06-16: qty 50

## 2019-06-16 MED ORDER — LABETALOL HCL 5 MG/ML IV SOLN: 10.0000 mg | INTRAVENOUS | Status: AC | PRN

## 2019-06-16 MED ORDER — ACETAMINOPHEN 325 MG PO TABS
650.0000 mg | ORAL_TABLET | ORAL | Status: DC | PRN
  Administered 2019-06-16 (×2): 650 mg via ORAL
  Filled 2019-06-16 (×2): qty 2

## 2019-06-16 MED ORDER — HEPARIN SODIUM (PORCINE) 1000 UNIT/ML IJ SOLN
INTRAMUSCULAR | Status: DC | PRN
  Administered 2019-06-16: 7000 [IU] via INTRAVENOUS

## 2019-06-16 MED ORDER — ASPIRIN 81 MG PO CHEW
81.0000 mg | CHEWABLE_TABLET | Freq: Every day | ORAL | Status: DC
  Administered 2019-06-17: 81 mg via ORAL
  Filled 2019-06-16 (×2): qty 1

## 2019-06-16 MED ORDER — MIDAZOLAM HCL 2 MG/2ML IJ SOLN
INTRAMUSCULAR | Status: AC
  Filled 2019-06-16: qty 2

## 2019-06-16 MED ORDER — VERAPAMIL HCL 2.5 MG/ML IV SOLN
INTRAVENOUS | Status: AC
  Filled 2019-06-16: qty 2

## 2019-06-16 MED ORDER — NITROGLYCERIN 1 MG/10 ML FOR IR/CATH LAB
INTRA_ARTERIAL | Status: AC
  Filled 2019-06-16: qty 10

## 2019-06-16 MED ORDER — HEPARIN (PORCINE) IN NACL 1000-0.9 UT/500ML-% IV SOLN
INTRAVENOUS | Status: AC
  Filled 2019-06-16: qty 500

## 2019-06-16 MED ORDER — HEPARIN SODIUM (PORCINE) 1000 UNIT/ML IJ SOLN
INTRAMUSCULAR | Status: AC
  Filled 2019-06-16: qty 1

## 2019-06-16 MED ORDER — LIDOCAINE HCL (PF) 1 % IJ SOLN
INTRAMUSCULAR | Status: DC | PRN
  Administered 2019-06-16: 2 mL via INTRADERMAL
  Administered 2019-06-16: 15 mL via INTRADERMAL

## 2019-06-16 MED ORDER — HYDRALAZINE HCL 20 MG/ML IJ SOLN: 10.0000 mg | INTRAMUSCULAR | Status: AC | PRN

## 2019-06-16 MED ORDER — SODIUM CHLORIDE 0.9 % IV SOLN: INTRAVENOUS | Status: AC

## 2019-06-16 MED ORDER — SODIUM CHLORIDE 0.9 % IV SOLN: 250.0000 mL | INTRAVENOUS | Status: DC | PRN

## 2019-06-16 MED ORDER — CLOPIDOGREL BISULFATE 300 MG PO TABS
ORAL_TABLET | ORAL | Status: AC
  Filled 2019-06-16: qty 1

## 2019-06-16 MED ORDER — VERAPAMIL HCL 2.5 MG/ML IV SOLN
INTRAVENOUS | Status: DC | PRN
  Administered 2019-06-16: 10 mL via INTRA_ARTERIAL

## 2019-06-16 MED ORDER — CLOPIDOGREL BISULFATE 300 MG PO TABS
ORAL_TABLET | ORAL | Status: DC | PRN
  Administered 2019-06-16: 300 mg via ORAL

## 2019-06-16 MED ORDER — FAMOTIDINE IN NACL 20-0.9 MG/50ML-% IV SOLN
INTRAVENOUS | Status: AC | PRN
  Administered 2019-06-16: 20 mg via INTRAVENOUS

## 2019-06-16 MED ORDER — IOHEXOL 350 MG/ML SOLN
INTRAVENOUS | Status: DC | PRN
  Administered 2019-06-16: 80 mL via INTRACARDIAC

## 2019-06-16 MED ORDER — FENTANYL CITRATE (PF) 100 MCG/2ML IJ SOLN
INTRAMUSCULAR | Status: DC | PRN
  Administered 2019-06-16 (×3): 25 ug via INTRAVENOUS

## 2019-06-16 MED ORDER — SODIUM CHLORIDE 0.9% FLUSH: 3.0000 mL | INTRAVENOUS | Status: DC | PRN

## 2019-06-16 SURGICAL SUPPLY — 22 items
BALLN SAPPHIRE 2.0X15 (BALLOONS) ×2
BALLN SAPPHIRE ~~LOC~~ 2.5X18 (BALLOONS) ×2 IMPLANT
BALLOON SAPPHIRE 2.0X15 (BALLOONS) ×1 IMPLANT
CATH IMPULSE 5F ANG/FL3.5 (CATHETERS) ×2 IMPLANT
CATH LAUNCHER 6FR JR4 (CATHETERS) ×2 IMPLANT
DEVICE RAD COMP TR BAND LRG (VASCULAR PRODUCTS) ×2 IMPLANT
GLIDESHEATH SLEND SS 6F .021 (SHEATH) ×2 IMPLANT
GUIDEWIRE INQWIRE 1.5J.035X260 (WIRE) ×1 IMPLANT
INQWIRE 1.5J .035X260CM (WIRE) ×2
KIT ENCORE 26 ADVANTAGE (KITS) ×2 IMPLANT
KIT HEART LEFT (KITS) ×2 IMPLANT
KIT HEMO VALVE WATCHDOG (MISCELLANEOUS) ×2 IMPLANT
PACK CARDIAC CATHETERIZATION (CUSTOM PROCEDURE TRAY) ×2 IMPLANT
SHEATH PINNACLE 6F 10CM (SHEATH) ×2 IMPLANT
SHEATH PROBE COVER 6X72 (BAG) ×2 IMPLANT
STENT SYNERGY XD 2.25X28 (Permanent Stent) ×1 IMPLANT
SYNERGY XD 2.25X28 (Permanent Stent) ×2 IMPLANT
TRANSDUCER W/STOPCOCK (MISCELLANEOUS) ×2 IMPLANT
TUBING CIL FLEX 10 FLL-RA (TUBING) ×2 IMPLANT
WIRE ASAHI PROWATER 180CM (WIRE) ×2 IMPLANT
WIRE EMERALD 3MM-J .035X150CM (WIRE) ×2 IMPLANT
WIRE HI TORQ VERSACORE-J 145CM (WIRE) ×2 IMPLANT

## 2019-06-16 NOTE — Progress Notes (Signed)
Sheath pulled at 1130 by Mickel Baas, RN.   Right femoral site: Clean and dry. Bedrest began: 1200 for 4 hours.

## 2019-06-16 NOTE — Plan of Care (Signed)
  Problem: Education: Goal: Knowledge of General Education information will improve Description: Including pain rating scale, medication(s)/side effects and non-pharmacologic comfort measures Outcome: Progressing   Problem: Clinical Measurements: Goal: Ability to maintain clinical measurements within normal limits will improve Outcome: Progressing Goal: Diagnostic test results will improve Outcome: Progressing   Problem: Coping: Goal: Level of anxiety will decrease Outcome: Progressing   Problem: Clinical Measurements: Goal: Will remain free from infection Outcome: Completed/Met Goal: Respiratory complications will improve Outcome: Completed/Met   Problem: Nutrition: Goal: Adequate nutrition will be maintained Outcome: Completed/Met   Problem: Elimination: Goal: Will not experience complications related to bowel motility Outcome: Completed/Met Goal: Will not experience complications related to urinary retention Outcome: Completed/Met   Problem: Pain Managment: Goal: General experience of comfort will improve Outcome: Completed/Met

## 2019-06-16 NOTE — Progress Notes (Signed)
CARDIAC REHAB PHASE I   Stent education completed with pt and daughter. Pt educated on importance of ASA, Plavix, statin, and NTG. Pt given MI book and heart healthy diet. Reviewed restrictions, site care, and exercise guidelines. Will refer to CRP II [place]. Pt is interested in participating in Virtual Cardiac and Pulmonary Rehab. Pt advised that Virtual Cardiac and Pulmonary Rehab is provided at no cost to the patient.  Checklist:  1. Pt has smart device  ie smartphone and/or ipad for downloading an app  Yes 2. Reliable internet/wifi service    Yes 3. Understands how to use their smartphone and navigate within an app.  Yes  Pt verbalized understanding and is in agreement.  [id number] Rufina Falco, RN BSN 06/16/2019 11:13 AM

## 2019-06-16 NOTE — Progress Notes (Signed)
ANTICOAGULATION CONSULT NOTE - Follow Up Consult  Pharmacy Consult for Heparin Indication: chest pain/ACS  Allergies  Allergen Reactions  . Penicillins Other (See Comments)    SYNCOPE PATIENT HAS HAD A PCN REACTION WITH IMMEDIATE RASH, FACIAL/TONGUE/THROAT SWELLING, SOB, OR LIGHTHEADEDNESS WITH HYPOTENSION:  #  #  YES  #  # Has patient had a PCN reaction causing severe rash involving mucus membranes or skin necrosis: No Has patient had a PCN reaction that required hospitalization: No Has patient had a PCN reaction occurring within the last 10 years: No   . Vicodin [Hydrocodone-Acetaminophen] Other (See Comments)    Sped up heart and breathing    Patient Measurements: Height: 5\' 5"  (165.1 cm) Weight: 131 lb 11.2 oz (59.7 kg) IBW/kg (Calculated) : 57 Heparin Dosing Weight: 59.6  Vital Signs: Temp: 98.1 F (36.7 C) (12/28 0614) Temp Source: Oral (12/28 1638) BP: 125/63 (12/28 1000) Pulse Rate: 61 (12/28 1000)  Labs: Recent Labs    06/13/19 1304 06/13/19 1653 06/14/19 0446 06/14/19 0928 06/15/19 0329 06/16/19 0339  HGB  --    < > 10.8*  --  10.7* 11.1*  HCT  --   --  33.7*  --  34.1* 34.8*  PLT  --   --  183  --  191 171  HEPARINUNFRC  --   --   --  0.64 0.39 0.48  CREATININE  --   --  1.03*  --  1.04* 0.98  TROPONINIHS 9,133*  --   --  4,536*  --   --    < > = values in this interval not displayed.    Estimated Creatinine Clearance: 49.4 mL/min (by C-G formula based on SCr of 0.98 mg/dL).   Medications:  Scheduled:  . amLODipine  5 mg Oral Daily  . aspirin  81 mg Oral Daily  . atorvastatin  80 mg Oral q1800  . cholecalciferol  5,000 Units Oral Daily  . [START ON 06/17/2019] clopidogrel  75 mg Oral Q breakfast  . dorzolamide-timolol  1 drop Both Eyes BID  . ezetimibe  10 mg Oral Daily  . hydrochlorothiazide  12.5 mg Oral Daily  . latanoprost  1 drop Both Eyes QHS  . osimertinib mesylate  80 mg Oral Daily  . sodium chloride flush  3 mL Intravenous Once  .  sodium chloride flush  3 mL Intravenous Q12H  . sodium chloride flush  3 mL Intravenous Q12H   Infusions:  . sodium chloride 100 mL/hr at 06/16/19 0910  . sodium chloride      Assessment: Patient is a 68 y/o female with history of stage IV NSCLC (DX July 2019 with mediastinal lymphadenopathy and malignant effusion), HTN, and HLD who presents with substernal chest pain radiating to jaw. There is concern for NSTEMI. Patient is not on anticoagulation PTA. Pharmacy has been consulted to dose heparin.   Heparin level at goal this AM, then turned off for cath lab.  No plans to resume heparin s/p cath given PCI.  Goal of Therapy:  INR 2-3 Heparin level 0.3-0.7 units/ml Monitor platelets by anticoagulation protocol: Yes   Plan:  D/C heparin. Question in cath note re: drug interaction with Tagrisso and Plavix.  I do not see any interactions listed.  Should be okay to continue Plavix.  Marguerite Olea, [facility] Clinical Pharmacist Phone [phone]  06/16/2019 10:14 AM

## 2019-06-16 NOTE — Interval H&P Note (Signed)
Cath Lab Visit (complete for each Cath Lab visit)  Clinical Evaluation Leading to the Procedure:   ACS: Yes.    Non-ACS:    Anginal Classification: CCS IV  Anti-ischemic medical therapy: Minimal Therapy (1 class of medications)  Non-Invasive Test Results: No non-invasive testing performed  Prior CABG: No previous CABG      History and Physical Interval Note:  06/16/2019 7:32 AM  Allison [name]  has presented today for surgery, with the diagnosis of NSTEMI.  The various methods of treatment have been discussed with the patient and family. After consideration of risks, benefits and other options for treatment, the patient has consented to  Procedure(s): LEFT HEART CATH AND CORONARY ANGIOGRAPHY (N/A) as a surgical intervention.  The patient's history has been reviewed, patient examined, no change in status, stable for surgery.  I have reviewed the patient's chart and labs.  Questions were answered to the patient's satisfaction.     Larae Grooms

## 2019-06-16 NOTE — Progress Notes (Addendum)
[facility]  PROGRESS NOTE    Allison [name]  FIE:[id number] DOB: 04/18/1951 DOA: 06/12/2019 PCP: [name], MD   Brief Narrative:   Allison Kneale Adamsonis a 68 y.o.femalewith medical history significantfor stage IV non-small cell lung cancer (DX July 2019 with mediastinal lymphadenopathy and malignant effusion), currently on Tagrisso, hypertension, hyperlipidemia who presents with chest pain. She describes the pain as a sharp, stabbing central sternal chest pain radiating to jaw and shoulders and it is associated with exertion. She explains that over the last week she has had this intermittent pain, that comes and goes with exertion. She was feeling better yesterday, and even worked out, the subsequently she developed some exertional dyspnea and pain, progressing today to the point where she felt like she was unable to get around much of the house without experiencing chest pain and dyspnea. Additionally she noted her heart rate was running in the 50s on her fitness wrist device, which is highly unusual for her. This prompted her to visit the ED. She has no personal history of coronary disease, though explains 3 of her siblings do. Outside of exertion, she describes no other exacerbating factors and outside of rest, she describes no other remitting factors. Denies fever, chills, nausea, vomiting, diarrhea, constipation, dysuria, hematuria.  06/16/19: S/p LHC w/ RCA stent placement. Denies complaints. Home in AM?  Assessment & Plan:   Active Problems:   NSTEMI (non-ST elevated myocardial infarction) ([place])   Stage 4 malignant neoplasm of lung (HCC)   Hypokalemia  CP - tele - cards onboard - trp trending up (last 9100); trp ordered for this AM - s/p Echo; results noted - EKG NSR, no ST changes - 06/15/19: to have LHC tomorrow, no CP ON. Monitor     - 06/16/19: s/p LHC w/ stent to RCA, appreciate cards assistance  S4 Lung CA - outpt f/u w/  heme-onc - continue tagrisso     - continue as above  Hypokalemia - resolved, follow up outpt  Normocytic anemia - stable, follow up outpt - no evidence of bleed; monitor  DVT prophylaxis: heparin Code Status: FULL   Disposition Plan: TBD  Consultants:   Cardiology  ROS:  Denies CP, ab pain, dyspnea, N, V . Remainder 10-pt ROS is negative for all not previously mentioned.  Subjective: "They used two places."  Objective: Vitals:   06/16/19 1115 06/16/19 1130 06/16/19 1145 06/16/19 1250  BP: (!) 106/59 126/74 130/65 129/65  Pulse: 65 62 (!) 57 (!) 54  Resp:      Temp:      TempSrc:      SpO2: 100% 100% 100% 100%  Weight:      Height:        Intake/Output Summary (Last 24 hours) at 06/16/2019 1344 Last data filed at 06/16/2019 0500 Gross per 24 hour  Intake 165.51 ml  Output --  Net 165.51 ml   Filed Weights   06/14/19 0547 06/15/19 0655 06/16/19 0614  Weight: 59.5 kg 59.6 kg 59.7 kg    Examination:  General: 68 y.o. female resting in bed in NAD Cardiovascular: RRR, +S1, S2, no m/g/r, equal pulses throughout Respiratory: CTABL, no w/r/r, normal WOB GI: BS+, NDNT, no masses noted, no organomegaly noted MSK: No e/c/c Neuro: A&O x 3, no focal deficits Psyc: Appropriate interaction and affect, calm/cooperative   Data Reviewed: I have personally reviewed following labs and imaging studies.  CBC: Recent Labs  Lab 06/12/19 1612 06/13/19 0100 06/14/19 0446 06/15/19 0329 06/16/19 0339  WBC 4.8  4.1 4.0 3.9* 4.0  NEUTROABS  --   --   --   --  1.8  HGB 11.7* 11.4* 10.8* 10.7* 11.1*  HCT 37.2 35.2* 33.7* 34.1* 34.8*  MCV 85.3 82.6 83.6 85.3 82.7  PLT 203 214 183 191 035   Basic Metabolic Panel: Recent Labs  Lab 06/13/19 0041 06/13/19 0100 06/13/19 1653 06/14/19 0446 06/15/19 0329 06/16/19 0339  NA  --  140 140 143 144 139  K  --  3.0* 3.5 4.4 4.3 3.8  CL  --  103 107 110 110 107  CO2  --  28 25 25 26 23   GLUCOSE  --  100* 99  103* 92 98  BUN  --  14 14 16 15 11   CREATININE  --  0.99 1.09* 1.03* 1.04* 0.98  CALCIUM  --  9.4 8.8* 8.9 9.3 9.3  MG 2.3  --   --   --   --  1.8  PHOS  --   --   --   --   --  4.3   GFR: Estimated Creatinine Clearance: 49.4 mL/min (by C-G formula based on SCr of 0.98 mg/dL). Liver Function Tests: Recent Labs  Lab 06/12/19 1612 06/16/19 0339  AST 43*  --   ALT 25  --   ALKPHOS 56  --   BILITOT 0.2*  --   PROT 7.1  --   ALBUMIN 3.9 3.2*   No results for input(s): LIPASE, AMYLASE in the last 168 hours. No results for input(s): AMMONIA in the last 168 hours. Coagulation Profile: No results for input(s): INR, PROTIME in the last 168 hours. Cardiac Enzymes: No results for input(s): CKTOTAL, CKMB, CKMBINDEX, TROPONINI in the last 168 hours. BNP (last 3 results) No results for input(s): PROBNP in the last 8760 hours. HbA1C: No results for input(s): HGBA1C in the last 72 hours. CBG: No results for input(s): GLUCAP in the last 168 hours. Lipid Profile: No results for input(s): CHOL, HDL, LDLCALC, TRIG, CHOLHDL, LDLDIRECT in the last 72 hours. Thyroid Function Tests: No results for input(s): TSH, T4TOTAL, FREET4, T3FREE, THYROIDAB in the last 72 hours. Anemia Panel: No results for input(s): VITAMINB12, FOLATE, FERRITIN, TIBC, IRON, RETICCTPCT in the last 72 hours. Sepsis Labs: No results for input(s): PROCALCITON, LATICACIDVEN in the last 168 hours.  Recent Results (from the past 240 hour(s))  Respiratory Panel by RT PCR (Flu A&B, Covid) - Nasopharyngeal Swab     Status: None   Collection Time: 06/12/19  5:39 PM   Specimen: Nasopharyngeal Swab  Result Value Ref Range Status   SARS Coronavirus 2 by RT PCR NEGATIVE NEGATIVE Final    Comment: (NOTE) SARS-CoV-2 target nucleic acids are NOT DETECTED. The SARS-CoV-2 RNA is generally detectable in upper respiratoy specimens during the acute phase of infection. The lowest concentration of SARS-CoV-2 viral copies this assay can  detect is 131 copies/mL. A negative result does not preclude SARS-Cov-2 infection and should not be used as the sole basis for treatment or other patient management decisions. A negative result may occur with  improper specimen collection/handling, submission of specimen other than nasopharyngeal swab, presence of viral mutation(s) within the areas targeted by this assay, and inadequate number of viral copies (<131 copies/mL). A negative result must be combined with clinical observations, patient history, and epidemiological information. The expected result is Negative. Fact Sheet for Patients:  PinkCheek.be Fact Sheet for Healthcare Providers:  GravelBags.it This test is not yet ap proved or cleared by the [place]  FDA and  has been authorized for detection and/or diagnosis of SARS-CoV-2 by FDA under an Emergency Use Authorization (EUA). This EUA will remain  in effect (meaning this test can be used) for the duration of the COVID-19 declaration under Section 564(b)(1) of the Act, 21 U.S.C. section 360bbb-3(b)(1), unless the authorization is terminated or revoked sooner.    Influenza A by PCR NEGATIVE NEGATIVE Final   Influenza B by PCR NEGATIVE NEGATIVE Final    Comment: (NOTE) The Xpert Xpress SARS-CoV-2/FLU/RSV assay is intended as an aid in  the diagnosis of influenza from Nasopharyngeal swab specimens and  should not be used as a sole basis for treatment. Nasal washings and  aspirates are unacceptable for Xpert Xpress SARS-CoV-2/FLU/RSV  testing. Fact Sheet for Patients: PinkCheek.be Fact Sheet for Healthcare Providers: GravelBags.it This test is not yet approved or cleared by the [place] FDA and  has been authorized for detection and/or diagnosis of SARS-CoV-2 by  FDA under an Emergency Use Authorization (EUA). This EUA will remain  in effect (meaning  this test can be used) for the duration of the  Covid-19 declaration under Section 564(b)(1) of the Act, 21  U.S.C. section 360bbb-3(b)(1), unless the authorization is  terminated or revoked. Performed at [facility], [place] [address] [place], [place] [address]       Radiology Studies: CARDIAC CATHETERIZATION  Result Date: 06/16/2019  Prox LAD lesion is 25% stenosed.  Prox RCA lesion is 95% stenosed.  A drug-eluting stent was successfully placed using a SYNERGY XD 2.25X28, postdilated to 2.5 mm.  Post intervention, there is a 0% residual stenosis.  The left ventricular systolic function is normal.  LV end diastolic pressure is normal.  The left ventricular ejection fraction is 55-65% by visual estimate.  There is no aortic valve stenosis.  Continue dual antiplatelet therapy.  Would check with Pharmacy about interactions of Plavix with Tagrisso.  If there is an interaction, would switch to Brilinta.  Could consider discharge tonight after bed rest, if she is feeling well and cardiac rehab visit can be arranged in the hospital.     Scheduled Meds: . amLODipine  5 mg Oral Daily  . aspirin  81 mg Oral Daily  . atorvastatin  80 mg Oral q1800  . cholecalciferol  5,000 Units Oral Daily  . [START ON 06/17/2019] clopidogrel  75 mg Oral Q breakfast  . dorzolamide-timolol  1 drop Both Eyes BID  . ezetimibe  10 mg Oral Daily  . hydrochlorothiazide  12.5 mg Oral Daily  . latanoprost  1 drop Both Eyes QHS  . osimertinib mesylate  80 mg Oral Daily  . sodium chloride flush  3 mL Intravenous Once  . sodium chloride flush  3 mL Intravenous Q12H  . sodium chloride flush  3 mL Intravenous Q12H   Continuous Infusions: . sodium chloride 100 mL/hr at 06/16/19 0910  . sodium chloride       LOS: 4 days    Time spent: 25 minutes spent in the coordination of care today.    Jonnie Finner, DO [facility]  If 7PM-7AM, please contact  night-coverage [email or web address] 06/16/2019, 1:44 PM

## 2019-06-17 ENCOUNTER — Telehealth: Payer: Self-pay | Admitting: Medical

## 2019-06-17 DIAGNOSIS — Z955 Presence of coronary angioplasty implant and graft: Secondary | ICD-10-CM | POA: Diagnosis not present

## 2019-06-17 DIAGNOSIS — E785 Hyperlipidemia, unspecified: Secondary | ICD-10-CM | POA: Diagnosis not present

## 2019-06-17 DIAGNOSIS — I25110 Atherosclerotic heart disease of native coronary artery with unstable angina pectoris: Secondary | ICD-10-CM

## 2019-06-17 DIAGNOSIS — E876 Hypokalemia: Secondary | ICD-10-CM | POA: Diagnosis not present

## 2019-06-17 DIAGNOSIS — C3490 Malignant neoplasm of unspecified part of unspecified bronchus or lung: Secondary | ICD-10-CM | POA: Diagnosis not present

## 2019-06-17 DIAGNOSIS — I214 Non-ST elevation (NSTEMI) myocardial infarction: Secondary | ICD-10-CM | POA: Diagnosis not present

## 2019-06-17 LAB — BASIC METABOLIC PANEL
Anion gap: 9 (ref 5–15)
BUN: 17 mg/dL (ref 8–23)
CO2: 24 mmol/L (ref 22–32)
Calcium: 9.2 mg/dL (ref 8.9–10.3)
Chloride: 107 mmol/L (ref 98–111)
Creatinine, Ser: 1.02 mg/dL — ABNORMAL HIGH (ref 0.44–1.00)
GFR calc Af Amer: >60 mL/min (ref >60)
GFR calc non Af Amer: 56 mL/min — ABNORMAL LOW (ref >60)
Glucose, Bld: 99 mg/dL (ref 70–99)
Potassium: 3.6 mmol/L (ref 3.5–5.1)
Sodium: 140 mmol/L (ref 135–145)

## 2019-06-17 LAB — CBC
HCT: 34 % — ABNORMAL LOW (ref 36.0–46.0)
Hemoglobin: 10.8 g/dL — ABNORMAL LOW (ref 12.0–15.0)
MCH: 26.9 pg (ref 26.0–34.0)
MCHC: 31.8 g/dL (ref 30.0–36.0)
MCV: 84.6 fL (ref 80.0–100.0)
Platelets: 170 10*3/uL (ref 150–400)
RBC: 4.02 MIL/uL (ref 3.87–5.11)
RDW: 14.6 % (ref 11.5–15.5)
WBC: 5.2 10*3/uL (ref 4.0–10.5)
nRBC: 0 % (ref 0.0–0.2)

## 2019-06-17 LAB — MAGNESIUM: Magnesium: 1.8 mg/dL (ref 1.7–2.4)

## 2019-06-17 MED ORDER — HYDROCHLOROTHIAZIDE 12.5 MG PO TABS: 12.5000 mg | ORAL_TABLET | Freq: Every day | ORAL | 1 refills | Status: DC

## 2019-06-17 MED ORDER — NITROGLYCERIN 0.4 MG SL SUBL: 0.4000 mg | SUBLINGUAL_TABLET | SUBLINGUAL | 12 refills | Status: DC | PRN

## 2019-06-17 MED ORDER — ASPIRIN 81 MG PO CHEW: 81.0000 mg | CHEWABLE_TABLET | Freq: Every day | ORAL | Status: DC

## 2019-06-17 MED ORDER — CLOPIDOGREL BISULFATE 75 MG PO TABS: 75.0000 mg | ORAL_TABLET | Freq: Every day | ORAL | 2 refills | Status: DC

## 2019-06-17 MED ORDER — ATORVASTATIN CALCIUM 80 MG PO TABS: 80.0000 mg | ORAL_TABLET | Freq: Every day | ORAL | 1 refills | Status: DC

## 2019-06-17 MED FILL — ATORVASTATIN CALCIUM 80 MG: 80 | 30 days supply | Qty: 30 | Fill #0

## 2019-06-17 MED FILL — CLOPIDOGREL 75 MG TABLET: 75 | 30 days supply | Qty: 30 | Fill #0

## 2019-06-17 MED FILL — NITROGLYCERIN 0.4 MG TAB SL: 0.4 | 8 days supply | Qty: 25 | Fill #0

## 2019-06-17 MED FILL — Nitroglycerin IV Soln 100 MCG/ML in D5W: INTRA_ARTERIAL | Qty: 10 | Status: AC

## 2019-06-17 NOTE — Progress Notes (Signed)
CARDIAC REHAB PHASE I   PRE:  Rate/Rhythm: 75 SR  BP:  Sitting: 130/70      SaO2: 97 RA  MODE:  Ambulation: 400 ft   POST:  Rate/Rhythm: 96 SR  BP:  Sitting: 141/67    SaO2: 95 RA  PT ambulated 478ft in hallway standby assist with steady gait. Pt with visible SOB. Pt pointing to center of chest, asked her to describe, pt unable to, just states she "feels it". Strongly encouraged pt to take it easy at home, and slowly progress her walking. Pt referred to CRP II [place].  [id number] Rufina Falco, RN BSN 06/17/2019 9:25 AM

## 2019-06-17 NOTE — Care Management Important Message (Signed)
Important Message  Patient Details  Name: Allison [name] MRN: [id number] Date of Birth: 03/26/1951   Medicare Important Message Given:  Yes     [name] 06/17/2019, 2:09 PM

## 2019-06-17 NOTE — Telephone Encounter (Signed)
Patient still in hospital TOC attempt should be after discharge

## 2019-06-17 NOTE — Progress Notes (Addendum)
Progress Note  Patient Name: Allison [name] Date of Encounter: 06/17/2019  Primary Cardiologist: Donato Heinz, MD   Subjective   Feeling well this morning. Long discussion regarding medications.   Inpatient Medications    Scheduled Meds: . amLODipine  5 mg Oral Daily  . aspirin  81 mg Oral Daily  . atorvastatin  80 mg Oral q1800  . cholecalciferol  5,000 Units Oral Daily  . clopidogrel  75 mg Oral Q breakfast  . dorzolamide-timolol  1 drop Both Eyes BID  . ezetimibe  10 mg Oral Daily  . hydrochlorothiazide  12.5 mg Oral Daily  . latanoprost  1 drop Both Eyes QHS  . osimertinib mesylate  80 mg Oral Daily  . sodium chloride flush  3 mL Intravenous Once  . sodium chloride flush  3 mL Intravenous Q12H  . sodium chloride flush  3 mL Intravenous Q12H   Continuous Infusions: . sodium chloride     PRN Meds: sodium chloride, acetaminophen, ALPRAZolam, Melatonin, nitroGLYCERIN, ondansetron (ZOFRAN) IV, sodium chloride flush   Vital Signs    Vitals:   06/16/19 1700 06/16/19 1945 06/17/19 0430 06/17/19 0800  BP:  124/61 122/67 130/70  Pulse: 60 72 72   Resp:  17 16   Temp:  98.6 F (37 C) 98.4 F (36.9 C)   TempSrc:  Oral Oral   SpO2: 100% 100% 100% 98%  Weight:   60.2 kg   Height:        Intake/Output Summary (Last 24 hours) at 06/17/2019 0823 Last data filed at 06/17/2019 0446 Gross per 24 hour  Intake 1588.67 ml  Output 500 ml  Net 1088.67 ml   Last 3 Weights 06/17/2019 06/16/2019 06/15/2019  Weight (lbs) 132 lb 12.8 oz 131 lb 11.2 oz 131 lb 8 oz  Weight (kg) 60.238 kg 59.739 kg 59.648 kg      Telemetry    SR - Personally Reviewed  ECG    No new tracing this morning.  Physical Exam  Pleasant AAF, sitting up in bed.  GEN: No acute distress.   Neck: No JVD Cardiac: RRR, no murmurs, rubs, or gallops.  Respiratory: Clear to auscultation bilaterally. GI: Soft, nontender, non-distended  MS: No edema; No deformity. Right radial/femoral cath  site stable.  Neuro:  Nonfocal  Psych: Normal affect   Labs    High Sensitivity Troponin:   Recent Labs  Lab 06/12/19 1819 06/13/19 0041 06/13/19 0207 06/13/19 1304 06/14/19 0928  TROPONINIHS 3,526* 5,696* 6,668* 9,133* 8,864*      Chemistry Recent Labs  Lab 06/12/19 1612 06/15/19 0329 06/16/19 0339 06/17/19 0356  NA 137 144 139 140  K 3.6 4.3 3.8 3.6  CL 103 110 107 107  CO2 25 26 23 24   GLUCOSE 107* 92 98 99  BUN 19 15 11 17   CREATININE 1.03* 1.04* 0.98 1.02*  CALCIUM 9.3 9.3 9.3 9.2  PROT 7.1  --   --   --   ALBUMIN 3.9  --  3.2*  --   AST 43*  --   --   --   ALT 25  --   --   --   ALKPHOS 56  --   --   --   BILITOT 0.2*  --   --   --   GFRNONAA 56* 55* 59* 56*  GFRAA >60 >60 >60 >60  ANIONGAP 9 8 9 9      Hematology Recent Labs  Lab 06/15/19 0329 06/16/19 0339 06/17/19 0356  WBC 3.9* 4.0 5.2  RBC 4.00 4.21 4.02  HGB 10.7* 11.1* 10.8*  HCT 34.1* 34.8* 34.0*  MCV 85.3 82.7 84.6  MCH 26.8 26.4 26.9  MCHC 31.4 31.9 31.8  RDW 14.6 14.4 14.6  PLT 191 171 170    BNP Recent Labs  Lab 06/13/19 0041  BNP 177.4*     DDimer No results for input(s): DDIMER in the last 168 hours.   Radiology    CARDIAC CATHETERIZATION  Result Date: 06/16/2019  Prox LAD lesion is 25% stenosed.  Prox RCA lesion is 95% stenosed.  A drug-eluting stent was successfully placed using a SYNERGY XD 2.25X28, postdilated to 2.5 mm.  Post intervention, there is a 0% residual stenosis.  The left ventricular systolic function is normal.  LV end diastolic pressure is normal.  The left ventricular ejection fraction is 55-65% by visual estimate.  There is no aortic valve stenosis.  Continue dual antiplatelet therapy.  Would check with Pharmacy about interactions of Plavix with Tagrisso.  If there is an interaction, would switch to Brilinta.  Could consider discharge tonight after bed rest, if she is feeling well and cardiac rehab visit can be arranged in the hospital.     Cardiac Studies   TTE: 06/13/19  IMPRESSIONS    1. Left ventricular ejection fraction, by visual estimation, is 55 to 60%. The left ventricle has normal function. There is mildly increased left ventricular hypertrophy.  2. The left ventricle has no regional wall motion abnormalities.  3. Global right ventricle has low normal systolic function.The right ventricular size is normal. No increase in right ventricular wall thickness.  4. Left atrial size was normal.  5. Right atrial size was normal.  6. Mild mitral annular calcification.  7. The mitral valve is degenerative. Moderate mitral valve regurgitation.  8. The tricuspid valve is grossly normal.  9. The aortic valve is tricuspid. Aortic valve regurgitation is not visualized. No evidence of aortic valve sclerosis or stenosis. 10. The pulmonic valve was grossly normal. Pulmonic valve regurgitation is not visualized. 11. Normal pulmonary artery systolic pressure. 12. The inferior vena cava is normal in size with greater than 50% respiratory variability, suggesting right atrial pressure of 3 mmHg.  Cath: 06/16/19   Prox LAD lesion is 25% stenosed.  Prox RCA lesion is 95% stenosed.  A drug-eluting stent was successfully placed using a SYNERGY XD 2.25X28, postdilated to 2.5 mm.  Post intervention, there is a 0% residual stenosis.  The left ventricular systolic function is normal.  LV end diastolic pressure is normal.  The left ventricular ejection fraction is 55-65% by visual estimate.  There is no aortic valve stenosis.   Continue dual antiplatelet therapy.  Would check with Pharmacy about interactions of Plavix with Tagrisso.  If there is an interaction, would switch to Brilinta.    Could consider discharge tonight after bed rest, if she is feeling well and cardiac rehab visit can be arranged in the hospital.   Diagnostic Dominance: Right  Intervention      Patient Profile     68 y.o. female with a hx of  stage IV lung cancer, HTN, HLDwho was seen for the evaluation of NSTEMI. Underwent cardiac cath note above.   Assessment & Plan    1. NSTEMI: HsT peaked > 9000. She underwent cardiac cath noted above with PCI/DES x1 to the pRCA. Plan for DAPT with ASA/plavix for at least one year. Discussed with PharmD and no concern for interaction with Tagrisso.  -- continue  ASA, plavix, statin, norvasc   2. HL: LDL 148, now on high dose statin. Reports having had myalgias in the past with a different statin but willing to try lipitor.   3. Stage 4 lung CA: follows with hem/onc as an outpatient. On tagrisso  4. HTN: stable with current therapy. Had some bradycardia and BB was avoided initially. Consider switching HCTZ to BB as an outpatient if agreeable.    [facility] will sign off.   Medication Recommendations:  Noted above Other recommendations (labs, testing, etc):  none Follow up as an outpatient:  Will arrange TOC follow up  For questions or updates, please contact [place] Please consult [email or web address] for contact info under        Signed, Reino Bellis, NP  06/17/2019, 8:23 AM     ATTENDING ATTESTATION  I have seen, examined and evaluated the patient this AM along with Reino Bellis, NP-C.  After reviewing all the available data and chart, we discussed the patients laboratory, study & physical findings as well as symptoms in detail. I agree with her findings, examination as well as impression recommendations as per our discussion.    Attending adjustments noted in italics.   I personally reviewed the cath films yesterday with Dr. Irish Lack.  Great result on the RCA. She is now on aspirin plus Plavix with plan to continue for 1 year.  (She had some nosebleeds.  If this occurs and is significant, would recommend holding aspirin for 1 week).  She does need aggressive lipid modification-we will continue on statin for now for the endovascular benefit.  I suspect that she may  very well need PCSK9 inhibitor. With a concern for possible fatigue with statin, we are holding off on starting beta-blocker since she has a bradycardia upon arrival.  I would suspect that an outpatient follow-up we could potentially replace HCTZ with beta-blocker.  Her blood pressure is otherwise stable.  Okay to discharge from a cardiac standpoint.   Allison [name], M.D., M.S. Interventional Cardiologist   Pager # [phone] Phone # [phone] [address] [place], [place] [address]

## 2019-06-17 NOTE — Discharge Summary (Signed)
Physician Discharge Summary  Allison [name] JOI:[id number] DOB: 03-04-1951 DOA: 06/12/2019  PCP: [name], MD  Admit date: 06/12/2019 Discharge date: 06/17/2019  Admitted From: Home Disposition: Home Recommendations for Outpatient Follow-up:  1. Follow up with PCP in 1-2 weeks 2. Please obtain BMP/CBC in one week 3. Please follow up with cardiology  Home Health: None  equipment/Devices none Discharge Condition stable and improved CODE STATUS: Full code  diet recommendation: Cardiac  Brief/Interim Summary:68 y.o.femalewith medical history significantfor stage IV non-small cell lung cancer (DX July 2019 with mediastinal lymphadenopathy and malignant effusion), currently on Tagrisso, hypertension, hyperlipidemia who presents with chest pain. She describes the pain as a sharp, stabbing central sternal chest pain radiating to jaw and shoulders and it is associated with exertion. She explains that over the last week she has had this intermittent pain, that comes and goes with exertion. She was feeling better yesterday, and even worked out, the subsequently she developed some exertional dyspnea and pain, progressing today to the point where she felt like she was unable to get around much of the house without experiencing chest pain and dyspnea. Additionally she noted her heart rate was running in the 50s on her fitness wrist device, which is highly unusual for her. This prompted her to visit the ED. She has no personal history of coronary disease, though explains 3 of her siblings do. Outside of exertion, she describes no other exacerbating factors and outside of rest, she describes no other remitting factors. Denies fever, chills, nausea, vomiting, diarrhea, constipation, dysuria, hematuria.  Discharge Diagnoses:  Active Problems:   NSTEMI (non-ST elevated myocardial infarction) ([place])   Stage 4 malignant neoplasm of lung (HCC)   Hypokalemia   #1 non-ST elevation MI status  post cardiac cath status post PCI to RCA. Continue dual antiplatelets aspirin and Plavix for at least a year, continue Norvasc high-dose statin.  Continue HCTZ.  Follow-up with cardiology.  #2 stage IV lung cancer continue Tagrisso follow-up with oncology  #3 normocytic anemia stable follow-up with PCP.  Patient had some mild nosebleeding during the hospital stay which has been resolved.  Hemoglobin 10.8 on discharge.  Estimated body mass index is 22.1 kg/m as calculated from the following:   Height as of this encounter: 5\' 5"  (1.651 m).   Weight as of this encounter: 60.2 kg.  Discharge Instructions  Discharge Instructions    Amb Referral to Cardiac Rehabilitation   Complete by: As directed    Diagnosis:  Coronary Stents NSTEMI     After initial evaluation and assessments completed: Virtual Based Care may be provided alone or in conjunction with Phase 2 Cardiac Rehab based on patient barriers.: Yes   Call MD for:  difficulty breathing, headache or visual disturbances   Complete by: As directed    Call MD for:  persistant nausea and vomiting   Complete by: As directed    Call MD for:  severe uncontrolled pain   Complete by: As directed    Call MD for:  temperature >100.4   Complete by: As directed    Diet - low sodium heart healthy   Complete by: As directed    Increase activity slowly   Complete by: As directed      Allergies as of 06/17/2019      Reactions   Penicillins Other (See Comments)   SYNCOPE PATIENT HAS HAD A PCN REACTION WITH IMMEDIATE RASH, FACIAL/TONGUE/THROAT SWELLING, SOB, OR LIGHTHEADEDNESS WITH HYPOTENSION:  #  #  YES  #  #  Has patient had a PCN reaction causing severe rash involving mucus membranes or skin necrosis: No Has patient had a PCN reaction that required hospitalization: No Has patient had a PCN reaction occurring within the last 10 years: No   Vicodin [hydrocodone-acetaminophen] Other (See Comments)   Sped up heart and breathing       Medication List    TAKE these medications   acetaminophen 500 MG tablet Commonly known as: TYLENOL Take 1,000 mg by mouth every 6 (six) hours as needed (pain).   ALPRAZolam 0.25 MG tablet Commonly known as: XANAX Take 0.25 mg by mouth 2 (two) times daily as needed for anxiety.   amLODipine 5 MG tablet Commonly known as: NORVASC Take 5 mg by mouth daily.   aspirin 81 MG chewable tablet Chew 1 tablet (81 mg total) by mouth daily. Start taking on: June 18, 2019   atorvastatin 80 MG tablet Commonly known as: LIPITOR Take 1 tablet (80 mg total) by mouth daily at 6 PM.   clopidogrel 75 MG tablet Commonly known as: PLAVIX Take 1 tablet (75 mg total) by mouth daily with breakfast. Start taking on: June 18, 2019   dorzolamide-timolol 22.3-6.8 MG/ML ophthalmic solution Commonly known as: COSOPT Instill 1 drop in both eyes twice daily   ezetimibe 10 MG tablet Commonly known as: ZETIA Take 10 mg by mouth daily.   HM Biotin 5 MG Caps Generic drug: Biotin Take 5,000 mg by mouth daily.   hydrochlorothiazide 12.5 MG tablet Commonly known as: HYDRODIURIL Take 1 tablet (12.5 mg total) by mouth daily. Start taking on: June 18, 2019 What changed:   medication strength  how much to take   latanoprost 0.005 % ophthalmic solution Commonly known as: XALATAN Place 1 drop into both eyes at bedtime.   loperamide 2 MG tablet Commonly known as: IMODIUM A-D Take 2 mg by mouth 4 (four) times daily as needed for diarrhea or loose stools.   Melatonin 5 MG Caps Take 10 mg by mouth at bedtime as needed (sleep).   nitroGLYCERIN 0.4 MG SL tablet Commonly known as: NITROSTAT Place 1 tablet (0.4 mg total) under the tongue every 5 (five) minutes x 3 doses as needed for chest pain.   osimertinib mesylate 80 MG tablet Commonly known as: TAGRISSO Take 1 tablet (80 mg total) by mouth daily. Refill Faxed to [facility]11/24/2020   VITAMIN D (CHOLECALCIFEROL) PO Take 5,000 Units by  mouth daily.      Follow-up Information    Roby Lofts M., PA-C Follow up on 06/27/2019.   Specialty: Physician Assistant Why: Please arrive 15 minutes early for your 9am post-hospital cardiology follow-up appointment Contact information: [address] [place] [address] [phone]        [name], MD Follow up.   Specialty: Internal Medicine Contact information: [address] [place] [address] [phone]        Donato Heinz, MD .   Specialty: Cardiology Contact information: [address] [place] [address] [place] [address] [phone]          Allergies  Allergen Reactions  . Penicillins Other (See Comments)    SYNCOPE PATIENT HAS HAD A PCN REACTION WITH IMMEDIATE RASH, FACIAL/TONGUE/THROAT SWELLING, SOB, OR LIGHTHEADEDNESS WITH HYPOTENSION:  #  #  YES  #  # Has patient had a PCN reaction causing severe rash involving mucus membranes or skin necrosis: No Has patient had a PCN reaction that required hospitalization: No Has patient had a PCN reaction  occurring within the last 10 years: No   . Vicodin [Hydrocodone-Acetaminophen] Other (See Comments)    Sped up heart and breathing    Consultations:  Cardiology   Procedures/Studies: DG Chest 2 View  Result Date: 06/12/2019 CLINICAL DATA:  Chest pain for 1 week, weakness, history hypertension, RIGHT-side lung cancer, Raynaud disease EXAM: CHEST - 2 VIEW COMPARISON:  04/09/2019 FINDINGS: Normal heart size, mediastinal contours, and pulmonary vascularity. RIGHT apical nodule 22 mm long axis, previously 20 mm. Remaining lungs clear. No acute infiltrate, pleural effusion or pneumothorax. Bones demineralized. IMPRESSION: Persistent RIGHT apical nodule question minimally larger versus projectional difference. No acute abnormalities. Electronically Signed   By: Lavonia Dana M.D.   On: 06/12/2019 16:32   CT Angio Chest PE W and/or Wo Contrast  Result Date:  06/12/2019 CLINICAL DATA:  68 year old female with shortness of breath. History of lung cancer. EXAM: CT ANGIOGRAPHY CHEST WITH CONTRAST TECHNIQUE: Multidetector CT imaging of the chest was performed using the standard protocol during bolus administration of intravenous contrast. Multiplanar CT image reconstructions and MIPs were obtained to evaluate the vascular anatomy. CONTRAST:  140mL OMNIPAQUE IOHEXOL 350 MG/ML SOLN COMPARISON:  Chest radiograph dated 06/12/2019 and CT dated 05/02/2019. FINDINGS: Cardiovascular: There is no cardiomegaly or pericardial effusion. Mild atherosclerotic calcification of the thoracic aorta. No aneurysmal dilatation. Apparent focal filling defect within the right upper lobe pulmonary artery branch (series 5, image 101 and coronal series 7, image 61) is likely artifactual. A nonocclusive embolus is less likely but not excluded. No other acute pulmonary artery embolus identified. Mediastinum/Nodes: There is no hilar or mediastinal adenopathy. The esophagus and the thyroid gland are grossly unremarkable as visualized. No mediastinal fluid collection. Lungs/Pleura: There is a small right pleural effusion similar to prior CT. Diffuse interstitial prominence may represent mild edema. There is a 2.5 x 1.7 cm nodule with irregular and spiculated margins in the right apex which is similar to prior CT. A 1 cm nodular density in the right lower lobe along the major fissure (series 6, image 79) appears similar to prior CT. Overall no interval change in the appearance of the lungs since the prior CT. There is no pneumothorax. The central airways are patent. Upper Abdomen: No acute abnormality. Musculoskeletal: No chest wall abnormality. No acute or significant osseous findings. Review of the MIP images confirms the above findings. IMPRESSION: 1. Artifact versus less likely nonocclusive thrombus in the right upper lobe pulmonary artery branch. 2. Small right pleural effusion similar to prior CT.  3. Right apical and right lower lobe nodular densities as seen on the prior CT. Overall no interval change in the appearance of the lungs since the prior CT. 4. Aortic Atherosclerosis (ICD10-I70.0). Electronically Signed   By: Anner Crete M.D.   On: 06/12/2019 19:05   CARDIAC CATHETERIZATION  Result Date: 06/16/2019  Prox LAD lesion is 25% stenosed.  Prox RCA lesion is 95% stenosed.  A drug-eluting stent was successfully placed using a SYNERGY XD [id number], postdilated to 2.5 mm.  Post intervention, there is a 0% residual stenosis.  The left ventricular systolic function is normal.  LV end diastolic pressure is normal.  The left ventricular ejection fraction is 55-65% by visual estimate.  There is no aortic valve stenosis.  Continue dual antiplatelet therapy.  Would check with Pharmacy about interactions of Plavix with Tagrisso.  If there is an interaction, would switch to Brilinta.  Could consider discharge tonight after bed rest, if she is feeling well and cardiac rehab visit can  be arranged in the hospital.   ECHOCARDIOGRAM COMPLETE  Result Date: 06/13/2019   ECHOCARDIOGRAM REPORT   Patient Name:   FAHMIDA JURICH Rickerson Date of Exam: 06/13/2019 Medical Rec #:  [id number]       Height:       65.0 in Accession #:    [id number]      Weight:       131.4 lb Date of Birth:  [birth year]       BSA:          1.65 m Patient Age:    10 years        BP:           97/57 mmHg Patient Gender: F               HR:           55 bpm. Exam Location:  Inpatient Procedure: 2D Echo, Color Doppler and Cardiac Doppler Indications:    NSTEMI  History:        Patient has no prior history of Echocardiogram examinations.                 Risk Factors:Hypertension and Dyslipidemia.  Sonographer:    Raquel Sarna Senior RDCS Referring Phys: [id number] [place]  1. Left ventricular ejection fraction, by visual estimation, is 55 to 60%. The left ventricle has normal function. There is mildly increased left ventricular  hypertrophy.  2. The left ventricle has no regional wall motion abnormalities.  3. Global right ventricle has low normal systolic function.The right ventricular size is normal. No increase in right ventricular wall thickness.  4. Left atrial size was normal.  5. Right atrial size was normal.  6. Mild mitral annular calcification.  7. The mitral valve is degenerative. Moderate mitral valve regurgitation.  8. The tricuspid valve is grossly normal.  9. The aortic valve is tricuspid. Aortic valve regurgitation is not visualized. No evidence of aortic valve sclerosis or stenosis. 10. The pulmonic valve was grossly normal. Pulmonic valve regurgitation is not visualized. 11. Normal pulmonary artery systolic pressure. 12. The inferior vena cava is normal in size with greater than 50% respiratory variability, suggesting right atrial pressure of 3 mmHg. FINDINGS  Left Ventricle: Left ventricular ejection fraction, by visual estimation, is 55 to 60%. The left ventricle has normal function. The left ventricle has no regional wall motion abnormalities. There is mildly increased left ventricular hypertrophy. Asymmetric left ventricular hypertrophy of the posterior wall. Left ventricular diastolic parameters were normal. Right Ventricle: The right ventricular size is normal. No increase in right ventricular wall thickness. Global RV systolic function is has low normal systolic function. The tricuspid regurgitant velocity is 2.35 m/s, and with an assumed right atrial pressure of 3 mmHg, the estimated right ventricular systolic pressure is normal at 25.1 mmHg. Left Atrium: Left atrial size was normal in size. Right Atrium: Right atrial size was normal in size Pericardium: There is no evidence of pericardial effusion. Mitral Valve: The mitral valve is degenerative in appearance. There is mild thickening of the mitral valve leaflet(s). Mild mitral annular calcification. Moderate mitral valve regurgitation. Tricuspid Valve: The  tricuspid valve is grossly normal. Tricuspid valve regurgitation moderate. Aortic Valve: The aortic valve is tricuspid. Aortic valve regurgitation is not visualized. The aortic valve is structurally normal, with no evidence of sclerosis or stenosis. Pulmonic Valve: The pulmonic valve was grossly normal. Pulmonic valve regurgitation is not visualized. Pulmonic regurgitation is not visualized. Aorta: The aortic root is normal  in size and structure. Venous: The inferior vena cava is normal in size with greater than 50% respiratory variability, suggesting right atrial pressure of 3 mmHg. IAS/Shunts: No atrial level shunt detected by color flow Doppler.  LEFT VENTRICLE PLAX 2D LVIDd:         4.00 cm  Diastology LVIDs:         2.80 cm  LV e' lateral:   8.27 cm/s LV PW:         1.10 cm  LV E/e' lateral: 11.0 LV IVS:        0.70 cm  LV e' medial:    8.59 cm/s LVOT diam:     1.60 cm  LV E/e' medial:  10.5 LV SV:         40 ml LV SV Index:   24.44 LVOT Area:     2.01 cm  RIGHT VENTRICLE RV S prime:     9.03 cm/s TAPSE (M-mode): 2.4 cm LEFT ATRIUM             Index       RIGHT ATRIUM           Index LA diam:        3.50 cm 2.12 cm/m  RA Area:     16.10 cm LA Vol (A2C):   60.8 ml 36.74 ml/m RA Volume:   42.60 ml  25.75 ml/m LA Vol (A4C):   44.6 ml 26.95 ml/m LA Biplane Vol: 52.6 ml 31.79 ml/m  AORTIC VALVE LVOT Vmax:   85.80 cm/s LVOT Vmean:  56.700 cm/s LVOT VTI:    0.181 m  AORTA Ao Root diam: 2.30 cm Ao Asc diam:  2.80 cm MITRAL VALVE                        TRICUSPID VALVE MV Area (PHT): 3.77 cm             TR Peak grad:   22.1 mmHg MV PHT:        58.29 msec           TR Vmax:        235.00 cm/s MV Decel Time: 201 msec MR Peak grad:    130.4 mmHg         SHUNTS MR Mean grad:    88.0 mmHg          Systemic VTI:  0.18 m MR Vmax:         571.00 cm/s        Systemic Diam: 1.60 cm MR Vmean:        452.0 cm/s MR PISA:         1.01 cm MR PISA Eff ROA: 5 mm MR PISA Radius:  0.40 cm MV E velocity: 90.60 cm/s 103 cm/s MV A  velocity: 51.40 cm/s 70.3 cm/s MV E/A ratio:  1.76       1.5  Kate Sable MD Electronically signed by Kate Sable MD Signature Date/Time: 06/13/2019/9:33:49 AM    Final     (Echo, Carotid, EGD, Colonoscopy, ERCP)    Subjective: She is resting in bed anxious to go home complains of some nosebleed which has resolved.  Discharge Exam: Vitals:   06/17/19 0430 06/17/19 0800  BP: 122/67 130/70  Pulse: 72   Resp: 16   Temp: 98.4 F (36.9 C)   SpO2: 100% 98%   Vitals:   06/16/19 1700 06/16/19 1945 06/17/19 0430 06/17/19 0800  BP:  124/61 122/67  130/70  Pulse: 60 72 72   Resp:  17 16   Temp:  98.6 F (37 C) 98.4 F (36.9 C)   TempSrc:  Oral Oral   SpO2: 100% 100% 100% 98%  Weight:   60.2 kg   Height:        General: Pt is alert, awake, not in acute distress Cardiovascular: RRR, S1/S2 +, no rubs, no gallops Respiratory: CTA bilaterally, no wheezing, no rhonchi Abdominal: Soft, NT, ND, bowel sounds + Extremities: no edema, no cyanosis Groin site clean dry intact   The results of significant diagnostics from this hospitalization (including imaging, microbiology, ancillary and laboratory) are listed below for reference.     Microbiology: Recent Results (from the past 240 hour(s))  Respiratory Panel by RT PCR (Flu A&B, Covid) - Nasopharyngeal Swab     Status: None   Collection Time: 06/12/19  5:39 PM   Specimen: Nasopharyngeal Swab  Result Value Ref Range Status   SARS Coronavirus 2 by RT PCR NEGATIVE NEGATIVE Final    Comment: (NOTE) SARS-CoV-2 target nucleic acids are NOT DETECTED. The SARS-CoV-2 RNA is generally detectable in upper respiratoy specimens during the acute phase of infection. The lowest concentration of SARS-CoV-2 viral copies this assay can detect is 131 copies/mL. A negative result does not preclude SARS-Cov-2 infection and should not be used as the sole basis for treatment or other patient management decisions. A negative result may occur  with  improper specimen collection/handling, submission of specimen other than nasopharyngeal swab, presence of viral mutation(s) within the areas targeted by this assay, and inadequate number of viral copies (<131 copies/mL). A negative result must be combined with clinical observations, patient history, and epidemiological information. The expected result is Negative. Fact Sheet for Patients:  PinkCheek.be Fact Sheet for Healthcare Providers:  GravelBags.it This test is not yet ap proved or cleared by the [place] FDA and  has been authorized for detection and/or diagnosis of SARS-CoV-2 by FDA under an Emergency Use Authorization (EUA). This EUA will remain  in effect (meaning this test can be used) for the duration of the COVID-19 declaration under Section 564(b)(1) of the Act, 21 U.S.C. section 360bbb-3(b)(1), unless the authorization is terminated or revoked sooner.    Influenza A by PCR NEGATIVE NEGATIVE Final   Influenza B by PCR NEGATIVE NEGATIVE Final    Comment: (NOTE) The Xpert Xpress SARS-CoV-2/FLU/RSV assay is intended as an aid in  the diagnosis of influenza from Nasopharyngeal swab specimens and  should not be used as a sole basis for treatment. Nasal washings and  aspirates are unacceptable for Xpert Xpress SARS-CoV-2/FLU/RSV  testing. Fact Sheet for Patients: PinkCheek.be Fact Sheet for Healthcare Providers: GravelBags.it This test is not yet approved or cleared by the [place] FDA and  has been authorized for detection and/or diagnosis of SARS-CoV-2 by  FDA under an Emergency Use Authorization (EUA). This EUA will remain  in effect (meaning this test can be used) for the duration of the  Covid-19 declaration under Section 564(b)(1) of the Act, 21  U.S.C. section 360bbb-3(b)(1), unless the authorization is  terminated or revoked. Performed  at [facility], [place] [address], [place], [place] [address]      Labs: BNP (last 3 results) Recent Labs    06/13/19 0041  BNP 923.3*   Basic Metabolic Panel: Recent Labs  Lab 06/13/19 0041 06/13/19 1653 06/14/19 0446 06/15/19 0329 06/16/19 0339 06/17/19 0356  NA  --  140 143 144 139 140  K  --  3.5 4.4 4.3 3.8 3.6  CL  --  107 110 110 107 107  CO2  --  25 25 26 23 24   GLUCOSE  --  99 103* 92 98 99  BUN  --  14 16 15 11 17   CREATININE  --  1.09* 1.03* 1.04* 0.98 1.02*  CALCIUM  --  8.8* 8.9 9.3 9.3 9.2  MG 2.3  --   --   --  1.8 1.8  PHOS  --   --   --   --  4.3  --    Liver Function Tests: Recent Labs  Lab 06/12/19 1612 06/16/19 0339  AST 43*  --   ALT 25  --   ALKPHOS 56  --   BILITOT 0.2*  --   PROT 7.1  --   ALBUMIN 3.9 3.2*   No results for input(s): LIPASE, AMYLASE in the last 168 hours. No results for input(s): AMMONIA in the last 168 hours. CBC: Recent Labs  Lab 06/13/19 0100 06/14/19 0446 06/15/19 0329 06/16/19 0339 06/17/19 0356  WBC 4.1 4.0 3.9* 4.0 5.2  NEUTROABS  --   --   --  1.8  --   HGB 11.4* 10.8* 10.7* 11.1* 10.8*  HCT 35.2* 33.7* 34.1* 34.8* 34.0*  MCV 82.6 83.6 85.3 82.7 84.6  PLT 214 183 191 171 170   Cardiac Enzymes: No results for input(s): CKTOTAL, CKMB, CKMBINDEX, TROPONINI in the last 168 hours. BNP: Invalid input(s): POCBNP CBG: No results for input(s): GLUCAP in the last 168 hours. D-Dimer No results for input(s): DDIMER in the last 72 hours. Hgb A1c No results for input(s): HGBA1C in the last 72 hours. Lipid Profile No results for input(s): CHOL, HDL, LDLCALC, TRIG, CHOLHDL, LDLDIRECT in the last 72 hours. Thyroid function studies No results for input(s): TSH, T4TOTAL, T3FREE, THYROIDAB in the last 72 hours.  Invalid input(s): FREET3 Anemia work up No results for input(s): VITAMINB12, FOLATE, FERRITIN, TIBC, IRON, RETICCTPCT in the last 72 hours. Urinalysis No results found for: COLORURINE,  APPEARANCEUR, [place], [place], [place], [place], [place], [place], PROTEINUR, UROBILINOGEN, NITRITE, LEUKOCYTESUR Sepsis Labs Invalid input(s): PROCALCITONIN,  WBC,  LACTICIDVEN Microbiology Recent Results (from the past 240 hour(s))  Respiratory Panel by RT PCR (Flu A&B, Covid) - Nasopharyngeal Swab     Status: None   Collection Time: 06/12/19  5:39 PM   Specimen: Nasopharyngeal Swab  Result Value Ref Range Status   SARS Coronavirus 2 by RT PCR NEGATIVE NEGATIVE Final    Comment: (NOTE) SARS-CoV-2 target nucleic acids are NOT DETECTED. The SARS-CoV-2 RNA is generally detectable in upper respiratoy specimens during the acute phase of infection. The lowest concentration of SARS-CoV-2 viral copies this assay can detect is 131 copies/mL. A negative result does not preclude SARS-Cov-2 infection and should not be used as the sole basis for treatment or other patient management decisions. A negative result may occur with  improper specimen collection/handling, submission of specimen other than nasopharyngeal swab, presence of viral mutation(s) within the areas targeted by this assay, and inadequate number of viral copies (<131 copies/mL). A negative result must be combined with clinical observations, patient history, and epidemiological information. The expected result is Negative. Fact Sheet for Patients:  PinkCheek.be Fact Sheet for Healthcare Providers:  GravelBags.it This test is not yet ap proved or cleared by the [place] FDA and  has been authorized for detection and/or diagnosis of SARS-CoV-2 by FDA under an Emergency Use Authorization (EUA). This EUA will remain  in effect (meaning this test can be used) for the duration of the COVID-19 declaration under Section 564(b)(1) of the Act, 21 U.S.C. section 360bbb-3(b)(1), unless the authorization is terminated or revoked sooner.    Influenza A by PCR NEGATIVE  NEGATIVE Final   Influenza B by PCR NEGATIVE NEGATIVE Final    Comment: (NOTE) The Xpert Xpress SARS-CoV-2/FLU/RSV assay is intended as an aid in  the diagnosis of influenza from Nasopharyngeal swab specimens and  should not be used as a sole basis for treatment. Nasal washings and  aspirates are unacceptable for Xpert Xpress SARS-CoV-2/FLU/RSV  testing. Fact Sheet for Patients: PinkCheek.be Fact Sheet for Healthcare Providers: GravelBags.it This test is not yet approved or cleared by the [place] FDA and  has been authorized for detection and/or diagnosis of SARS-CoV-2 by  FDA under an Emergency Use Authorization (EUA). This EUA will remain  in effect (meaning this test can be used) for the duration of the  Covid-19 declaration under Section 564(b)(1) of the Act, 21  U.S.C. section 360bbb-3(b)(1), unless the authorization is  terminated or revoked. Performed at [facility], [place] [address], [place],  [address]      Time coordinating discharge:  38 minutes  SIGNED:   Georgette Shell, MD  [facility] 06/17/2019, 11:23 AM Pager   If 7PM-7AM, please contact night-coverage [email or web address] Password [email or web address]

## 2019-06-17 NOTE — Telephone Encounter (Signed)
    TOC  appt 01/08

## 2019-06-18 ENCOUNTER — Telehealth (HOSPITAL_COMMUNITY): Payer: Self-pay

## 2019-06-18 NOTE — Telephone Encounter (Signed)
Patient contacted regarding discharge from {06/12/2019 - 06/17/2019 (5 days) [facility] Patient understands to follow up with provider on 01/08 at [facility] office. Patient understands discharge instructions? YES Patient understands medications and regiment? YES Patient understands to bring all medications to this visit? YES  PT WILL ARRIVE EARLY, ALONE WEARING A MASK. PT NOTIFIED THAT THERE ARE NOT VISITORS ALLOWED D/T COVID RESTRICTIONS.   Pt was taking magnesium before she was in the hospital should she continue taking this? It is not on discharge list.

## 2019-06-18 NOTE — Telephone Encounter (Signed)
Attempted to call patient in regards to Cardiac Rehab - LM on VM 

## 2019-06-25 ENCOUNTER — Telehealth: Payer: Self-pay

## 2019-06-25 NOTE — Progress Notes (Deleted)
Cardiology Office Note   Date:  06/25/2019   ID:  Allison [name], DOB [birth year], MRN [id number]  PCP:  [name], MD  Cardiologist:  Donato Heinz, MD EP: None  No chief complaint on file.     History of Present Illness: Allison [name] is a 69 y.o. female with a PMH of CAD s/p recent NSTEMI with PCI/DES to RCA 05/2019, HTN, HLD, and stage IV lung cancer, who presents for post-hospital follow-up.  She was last evaluated by cardiology during a recent /admission to the hospital from 06/12/2019-06/17/2019 after presenting with chest pain. She was found to have elevated HsTrops (peaked 9133). She underwent a LHC which showed 95% RCA stenosis managed with PCI/DES and 25% pLAD stenosis; recommended for DAPT with Aspirin and plavix x1 year. Echo that admission showed EF 55-60%, mild LVH, no RWMA, and moderate MR. She was bradycardic during her admission so BBlocker was not initiated.   She presents today for post hospital follow-up.   1. CAD s/p PCI/DES to RCA 05/2019: no recurrent chest pain - Continue aspirin and plavix - Continue statin  2. HTN: BP *** today - Continue HCTZ vs change to metoprolol ***   3. HLD: LDL 148 06/13/2019; gaol <70. Started on atorvastatin 80mg  daily during recent admission - Will check FLP and LFTs in 6-8 weeks for close monitoring - Continue atorvastatin 80mg  daily  4. Stage IV lung cancer: on tagrisso - Continue management and close follow-up per oncology    Past Medical History:  Diagnosis Date  . Anxiety   . Arthritis   . Asthma    exercise induced  . Depression    PMH  . GERD (gastroesophageal reflux disease)   . Glaucoma   . Hypertension   . lung ca dx'd 11/2017   right  . Malignant pleural effusion    right  . PONV (postoperative nausea and vomiting)   . Pre-diabetes   . Raynaud's disease   . Raynaud's disease     Past Surgical History:  Procedure Laterality Date  . ABDOMINAL HYSTERECTOMY     partial  .  CHEST TUBE INSERTION Right 01/01/2018   Procedure: INSERTION PLEURAL DRAINAGE CATHETER;  Surgeon: Ivin Poot, MD;  Location: [place];  Service: Thoracic;  Laterality: Right;  . COLONOSCOPY    . CORONARY STENT INTERVENTION N/A 06/16/2019   Procedure: CORONARY STENT INTERVENTION;  Surgeon: Jettie Booze, MD;  Location: [place] [facility];  Service: Cardiovascular;  Laterality: N/A;  . DILATION AND CURETTAGE OF UTERUS    . EYE SURGERY     due to Glaucoma  . LEFT HEART CATH AND CORONARY ANGIOGRAPHY N/A 06/16/2019   Procedure: LEFT HEART CATH AND CORONARY ANGIOGRAPHY;  Surgeon: Jettie Booze, MD;  Location: [place] [facility];  Service: Cardiovascular;  Laterality: N/A;  . REMOVAL OF PLEURAL DRAINAGE CATHETER Right 11/07/2018   Procedure: REMOVAL OF PLEURAL DRAINAGE CATHETER;  Surgeon: Ivin Poot, MD;  Location: [place];  Service: Thoracic;  Laterality: Right;  . ROTATOR CUFF REPAIR    . TUBAL LIGATION    . WISDOM TOOTH EXTRACTION       Current Outpatient Medications  Medication Sig Dispense Refill  . acetaminophen (TYLENOL) 500 MG tablet Take 1,000 mg by mouth every 6 (six) hours as needed (pain).     Marland Kitchen ALPRAZolam (XANAX) 0.25 MG tablet Take 0.25 mg by mouth 2 (two) times daily as needed for anxiety.     Marland Kitchen amLODipine (NORVASC)  5 MG tablet Take 5 mg by mouth daily.  3  . aspirin 81 MG chewable tablet Chew 1 tablet (81 mg total) by mouth daily.    Marland Kitchen atorvastatin (LIPITOR) 80 MG tablet Take 1 tablet (80 mg total) by mouth daily at 6 PM. 30 tablet 1  . Biotin (HM BIOTIN) 5 MG CAPS Take 5,000 mg by mouth daily.    . clopidogrel (PLAVIX) 75 MG tablet Take 1 tablet (75 mg total) by mouth daily with breakfast. 30 tablet 2  . dorzolamide-timolol (COSOPT) 22.3-6.8 MG/ML ophthalmic solution Instill 1 drop in both eyes twice daily    . ezetimibe (ZETIA) 10 MG tablet Take 10 mg by mouth daily.     . hydrochlorothiazide (HYDRODIURIL) 12.5 MG tablet Take 1 tablet (12.5 mg total) by  mouth daily. 30 tablet 1  . latanoprost (XALATAN) 0.005 % ophthalmic solution Place 1 drop into both eyes at bedtime.     Marland Kitchen loperamide (IMODIUM A-D) 2 MG tablet Take 2 mg by mouth 4 (four) times daily as needed for diarrhea or loose stools.    . Melatonin 5 MG CAPS Take 10 mg by mouth at bedtime as needed (sleep).    . nitroGLYCERIN (NITROSTAT) 0.4 MG SL tablet Place 1 tablet (0.4 mg total) under the tongue every 5 (five) minutes x 3 doses as needed for chest pain. 30 tablet 12  . osimertinib mesylate (TAGRISSO) 80 MG tablet Take 1 tablet (80 mg total) by mouth daily. Refill Faxed to [facility]11/24/2020 30 tablet 3  . VITAMIN D, CHOLECALCIFEROL, PO Take 5,000 Units by mouth daily.      No current facility-administered medications for this visit.    Allergies:   Penicillins and Vicodin [hydrocodone-acetaminophen]    Social History:  The patient  reports that she has never smoked. She has never used smokeless tobacco. She reports current alcohol use. She reports that she does not use drugs.   Family History:  The patient's ***family history includes Heart disease in her brother and sister; Lung cancer in an other family member.    ROS:  Please see the history of present illness.   Otherwise, review of systems are positive for {NONE DEFAULTED:18576::"none"}.   All other systems are reviewed and negative.    PHYSICAL EXAM: VS:  LMP  (LMP Unknown)  , BMI There is no height or weight on file to calculate BMI. GEN: Well nourished, well developed, in no acute distress HEENT: normal Neck: no JVD, carotid bruits, or masses Cardiac: ***RRR; no murmurs, rubs, or gallops,no edema  Respiratory:  clear to auscultation bilaterally, normal work of breathing GI: soft, nontender, nondistended, + BS MS: no deformity or atrophy Skin: warm and dry, no rash Neuro:  Strength and sensation are intact Psych: euthymic mood, full affect   EKG:  EKG {ACTION; IS/IS CHE:[id number]} ordered today. The ekg ordered  today demonstrates ***   Recent Labs: 06/12/2019: ALT 25 06/13/2019: B Natriuretic Peptide 177.4; TSH 1.774 06/17/2019: BUN 17; Creatinine, Ser 1.02; Hemoglobin 10.8; Magnesium 1.8; Platelets 170; Potassium 3.6; Sodium 140    Lipid Panel    Component Value Date/Time   CHOL 213 (H) 06/13/2019 0100   TRIG 63 06/13/2019 0100   HDL 52 06/13/2019 0100   CHOLHDL 4.1 06/13/2019 0100   VLDL 13 06/13/2019 0100   LDLCALC 148 (H) 06/13/2019 0100      Wt Readings from Last 3 Encounters:  06/17/19 132 lb 12.8 oz (60.2 kg)  05/05/19 131 lb 4.8 oz (59.6 kg)  04/09/19 129 lb (58.5 kg)      Other studies Reviewed: Additional studies/ records that were reviewed today include:  TTE: 06/13/19  IMPRESSIONS   1. Left ventricular ejection fraction, by visual estimation, is 55 to 60%. The left ventricle has normal function. There is mildly increased left ventricular hypertrophy. 2. The left ventricle has no regional wall motion abnormalities. 3. Global right ventricle has low normal systolic function.The right ventricular size is normal. No increase in right ventricular wall thickness. 4. Left atrial size was normal. 5. Right atrial size was normal. 6. Mild mitral annular calcification. 7. The mitral valve is degenerative. Moderate mitral valve regurgitation. 8. The tricuspid valve is grossly normal. 9. The aortic valve is tricuspid. Aortic valve regurgitation is not visualized. No evidence of aortic valve sclerosis or stenosis. 10. The pulmonic valve was grossly normal. Pulmonic valve regurgitation is not visualized. 11. Normal pulmonary artery systolic pressure. 12. The inferior vena cava is normal in size with greater than 50% respiratory variability, suggesting right atrial pressure of 3 mmHg.  Cath: 06/16/19   Prox LAD lesion is 25% stenosed.  Prox RCA lesion is 95% stenosed.  A drug-eluting stent was successfully placed using a SYNERGY XD [id number], postdilated to  2.5 mm.  Post intervention, there is a 0% residual stenosis.  The left ventricular systolic function is normal.  LV end diastolic pressure is normal.  The left ventricular ejection fraction is 55-65% by visual estimate.  There is no aortic valve stenosis.  Continue dual antiplatelet therapy. Would check with Pharmacy about interactions of Plavix with Tagrisso. If there is an interaction, would switch to Brilinta.   Could consider discharge tonight after bed rest, if she is feeling well and cardiac rehab visit can be arranged in the hospital.   Diagnostic Dominance: Right  Intervention          ASSESSMENT AND PLAN:  1.  ***   Current medicines are reviewed at length with the patient today.  The patient {ACTIONS; HAS/DOES NOT HAVE:19233} concerns regarding medicines.  The following changes have been made:  {PLAN; NO CHANGE:13088:s}  Labs/ tests ordered today include: *** No orders of the defined types were placed in this encounter.    Disposition:   FU with *** in {gen number 4-10:301314} {Days to years:10300}  Signed, Abigail Butts, PA-C  06/25/2019 12:16 PM

## 2019-06-25 NOTE — Telephone Encounter (Signed)
Oral Oncology Patient Advocate Encounter  Patient's application for [place] [employer] [place] Patient Assistance Program in an effort to reduce the patient's out of pocket expense for Tagrisso to $0 has been approved until 06/18/20.    [place] patient assistance phone number for follow up is [phone].   This encounter will be updated until final determination.  [place] [facility] [place] Phone [phone] Fax [phone] 06/25/2019 3:40 PM

## 2019-06-27 ENCOUNTER — Ambulatory Visit: Payer: Medicare HMO | Admitting: Medical

## 2019-07-03 ENCOUNTER — Ambulatory Visit: Payer: Medicare HMO | Admitting: Physician Assistant

## 2019-07-03 ENCOUNTER — Other Ambulatory Visit: Payer: Self-pay

## 2019-07-03 VITALS — BP 144/68 | HR 66 | Temp 95.5°F | Ht 64.0 in | Wt 132.6 lb

## 2019-07-03 DIAGNOSIS — R0989 Other specified symptoms and signs involving the circulatory and respiratory systems: Secondary | ICD-10-CM

## 2019-07-03 DIAGNOSIS — C3490 Malignant neoplasm of unspecified part of unspecified bronchus or lung: Secondary | ICD-10-CM | POA: Diagnosis not present

## 2019-07-03 DIAGNOSIS — I10 Essential (primary) hypertension: Secondary | ICD-10-CM

## 2019-07-03 DIAGNOSIS — Z79899 Other long term (current) drug therapy: Secondary | ICD-10-CM | POA: Diagnosis not present

## 2019-07-03 DIAGNOSIS — I2510 Atherosclerotic heart disease of native coronary artery without angina pectoris: Secondary | ICD-10-CM | POA: Diagnosis not present

## 2019-07-03 DIAGNOSIS — E785 Hyperlipidemia, unspecified: Secondary | ICD-10-CM | POA: Diagnosis not present

## 2019-07-03 LAB — CBC
Hematocrit: 36 % (ref 34.0–46.6)
Hemoglobin: 11.6 g/dL (ref 11.1–15.9)
MCH: 26.2 pg — ABNORMAL LOW (ref 26.6–33.0)
MCHC: 32.2 g/dL (ref 31.5–35.7)
MCV: 81 fL (ref 79–97)
Platelets: 291 10*3/uL (ref 150–450)
RBC: 4.42 x10E6/uL (ref 3.77–5.28)
RDW: 13.2 % (ref 11.7–15.4)
WBC: 5.6 10*3/uL (ref 3.4–10.8)

## 2019-07-03 MED ORDER — CARVEDILOL 3.125 MG PO TABS: 3.1250 mg | ORAL_TABLET | Freq: Two times a day (BID) | ORAL | 1 refills | Status: DC

## 2019-07-03 MED ORDER — CLOPIDOGREL BISULFATE 75 MG PO TABS: 75.0000 mg | ORAL_TABLET | Freq: Every day | ORAL | 3 refills | Status: DC

## 2019-07-03 MED ORDER — ATORVASTATIN CALCIUM 80 MG PO TABS: 80.0000 mg | ORAL_TABLET | Freq: Every day | ORAL | 3 refills | Status: DC

## 2019-07-03 NOTE — Patient Instructions (Signed)
Medication Instructions:   STOP HCTZ-HYDROCHLOROTHIAZIDE   START CARVEDILOL 3.125 MG 2 TIMES A DAY *If you need a refill on your cardiac medications before your next appointment, please call your pharmacy*  Lab Work: Your physician recommends that you return for lab work TODAY and again in 6-8 weeks:  CBC-TODAY  FASTING LIPID PANEL-DO NOT EAT OR DRINK PAST MIDNIGHT ([place])  HEPATIC (LIVER) FUNCTION TEST  If you have labs (blood work) drawn today and your tests are completely normal, you will receive your results only by: Marland Kitchen MyChart Message (if you have MyChart) OR . A paper copy in the mail If you have any lab test that is abnormal or we need to change your treatment, we will call you to review the results.  Testing/Procedures: Your physician has requested that you have a carotid duplex. This test is an ultrasound of the carotid arteries in your neck. It looks at blood flow through these arteries that supply the brain with blood. Allow one hour for this exam. There are no restrictions or special instructions.   PLEASE SCHEDULE FOR 1 MONTH  Follow-Up: At [facility], you and your health needs are our priority.  As part of our continuing mission to provide you with exceptional heart care, we have created designated Provider Care Teams.  These Care Teams include your primary Cardiologist (physician) and Advanced Practice Providers (APPs -  Physician Assistants and Nurse Practitioners) who all work together to provide you with the care you need, when you need it.  Your next appointment:   2 month(s)  The format for your next appointment:   In Person  Provider:   Oswaldo Milian, MD  Other Instructions

## 2019-07-03 NOTE — Progress Notes (Signed)
Cardiology Office Note:    Date:  07/05/2019   ID:  Allison [name], DOB 1951-01-28, MRN [id number]  PCP:  [name], MD  Cardiologist:  Donato Heinz, MD  Electrophysiologist:  None   Referring MD: [name], MD   Chief Complaint  Patient presents with  . Follow-up    seen for Dr. Gardiner Rhyme    History of Present Illness:    Allison [name] is a 69 y.o. female with a hx of stage IV lung cancer, HTN, HLD, and recently diagnosed CAD.  Patient recently presented to the hospital on 06/12/2019 with chest pain.  She was ruled in for NSTEMI by enzymes.  Initial troponin was 2355.  CT angiogram of the chest showed artifact versus less likely nonocclusive thrombus in the right upper lobe pulmonary artery branch.  Echocardiogram was obtained on 06/13/2019 which showed EF 55 to 60%, mild MR. Beta-blocker was not added due to bradycardia.  Cardiac catheterization was performed on 06/16/2019 after she was agreeable, this revealed a 95% proximal RCA lesion treated with synergy 2.25 x 28 mm DES, 25% proximal LAD lesion, EF 55 to 65%.  Postprocedure, he was placed on aspirin and Plavix.  Patient presents today for cardiology office visit.  She denies any further chest pain.  [facility] and has not noticed any issue.  We emphasized on the importance of compliance with aspirin and Plavix therapy.  Since her heart rate has improved at this point, I plan to discontinue hydrochlorothiazide and add 3.125 mg twice daily of carvedilol as recommended by Dr. Ellyn Hack.  We will get a CBC today to make sure she is not having any bleeding issue on aspirin and Plavix.  On physical exam, she has notable right carotid bruit, we will obtain a carotid Doppler.  She denies any lower extremity exertional claudication symptoms.  She will need a fasting lipid panel and LFT in 6 to 8 weeks.  Follow-up in 2 to 3 months.   Past Medical History:  Diagnosis Date  . Anxiety   .  Arthritis   . Asthma    exercise induced  . Depression    PMH  . GERD (gastroesophageal reflux disease)   . Glaucoma   . Hypertension   . lung ca dx'd 11/2017   right  . Malignant pleural effusion    right  . PONV (postoperative nausea and vomiting)   . Pre-diabetes   . Raynaud's disease   . Raynaud's disease     Past Surgical History:  Procedure Laterality Date  . ABDOMINAL HYSTERECTOMY     partial  . CHEST TUBE INSERTION Right 01/01/2018   Procedure: INSERTION PLEURAL DRAINAGE CATHETER;  Surgeon: Ivin Poot, MD;  Location: [facility];  Service: Thoracic;  Laterality: Right;  . COLONOSCOPY    . CORONARY STENT INTERVENTION N/A 06/16/2019   Procedure: CORONARY STENT INTERVENTION;  Surgeon: Jettie Booze, MD;  Location: [facility];  Service: Cardiovascular;  Laterality: N/A;  . DILATION AND CURETTAGE OF UTERUS    . EYE SURGERY     due to Glaucoma  . LEFT HEART CATH AND CORONARY ANGIOGRAPHY N/A 06/16/2019   Procedure: LEFT HEART CATH AND CORONARY ANGIOGRAPHY;  Surgeon: Jettie Booze, MD;  Location: [facility];  Service: Cardiovascular;  Laterality: N/A;  . REMOVAL OF PLEURAL DRAINAGE CATHETER Right 11/07/2018   Procedure: REMOVAL OF PLEURAL DRAINAGE CATHETER;  Surgeon: Ivin Poot, MD;  Location: [facility];  Service: Thoracic;  Laterality: Right;  . ROTATOR CUFF REPAIR    . TUBAL LIGATION    . WISDOM TOOTH EXTRACTION      Current Medications: Current Meds  Medication Sig  . acetaminophen (TYLENOL) 500 MG tablet Take 1,000 mg by mouth every 6 (six) hours as needed (pain).   Marland Kitchen ALPRAZolam (XANAX) 0.25 MG tablet Take 0.25 mg by mouth 2 (two) times daily as needed for anxiety.   Marland Kitchen amLODipine (NORVASC) 5 MG tablet Take 5 mg by mouth daily.  Marland Kitchen aspirin 81 MG chewable tablet Chew 1 tablet (81 mg total) by mouth daily.  Marland Kitchen atorvastatin (LIPITOR) 80 MG tablet Take 1 tablet (80 mg total) by mouth daily at 6 PM.  . Biotin (HM BIOTIN) 5 MG CAPS Take 5,000 mg  by mouth daily.  . clopidogrel (PLAVIX) 75 MG tablet Take 1 tablet (75 mg total) by mouth daily with breakfast.  . dorzolamide-timolol (COSOPT) 22.3-6.8 MG/ML ophthalmic solution Instill 1 drop in both eyes twice daily  . ezetimibe (ZETIA) 10 MG tablet Take 10 mg by mouth daily.   . hydrochlorothiazide (HYDRODIURIL) 12.5 MG tablet Take 1 tablet (12.5 mg total) by mouth daily.  Marland Kitchen latanoprost (XALATAN) 0.005 % ophthalmic solution Place 1 drop into both eyes at bedtime.   Marland Kitchen loperamide (IMODIUM A-D) 2 MG tablet Take 2 mg by mouth 4 (four) times daily as needed for diarrhea or loose stools.  . Melatonin 5 MG CAPS Take 10 mg by mouth at bedtime as needed (sleep).  . nitroGLYCERIN (NITROSTAT) 0.4 MG SL tablet Place 1 tablet (0.4 mg total) under the tongue every 5 (five) minutes x 3 doses as needed for chest pain.  Marland Kitchen osimertinib mesylate (TAGRISSO) 80 MG tablet Take 1 tablet (80 mg total) by mouth daily. Refill Faxed to [facility]11/24/2020  . VITAMIN D, CHOLECALCIFEROL, PO Take 5,000 Units by mouth daily.   . [DISCONTINUED] atorvastatin (LIPITOR) 80 MG tablet Take 1 tablet (80 mg total) by mouth daily at 6 PM.  . [DISCONTINUED] clopidogrel (PLAVIX) 75 MG tablet Take 1 tablet (75 mg total) by mouth daily with breakfast.     Allergies:   Penicillins and Vicodin [hydrocodone-acetaminophen]   Social History   Socioeconomic History  . Marital status: Widowed    Spouse name: Not on file  . Number of children: 3  . Years of education: Not on file  . Highest education level: Not on file  Occupational History    Employer: [employer]  Tobacco Use  . Smoking status: Never Smoker  . Smokeless tobacco: Never Used  Substance and Sexual Activity  . Alcohol use: Yes    Comment: up to 3 drinks per week  . Drug use: No    Comment: CBD oil   . Sexual activity: Not on file    Comment: Hysterectomy  Other Topics Concern  . Not on file  Social History Narrative  . Not on file   Social Determinants of Health    Financial Resource Strain:   . Difficulty of Paying Living Expenses: Not on file  Food Insecurity:   . Worried About Charity fundraiser in the Last Year: Not on file  . Ran Out of Food in the Last Year: Not on file  Transportation Needs:   . Lack of Transportation (Medical): Not on file  . Lack of Transportation (Non-Medical): Not on file  Physical Activity:   . Days of Exercise per Week: Not on file  . Minutes of  Exercise per Session: Not on file  Stress:   . Feeling of Stress : Not on file  Social Connections:   . Frequency of Communication with Friends and Family: Not on file  . Frequency of Social Gatherings with Friends and Family: Not on file  . Attends Religious Services: Not on file  . Active Member of Clubs or Organizations: Not on file  . Attends Archivist Meetings: Not on file  . Marital Status: Not on file     Family History: The patient's family history includes Heart disease in her brother and sister; Lung cancer in an other family member.  ROS:   Please see the history of present illness.     All other systems reviewed and are negative.  EKGs/Labs/Other Studies Reviewed:    The following studies were reviewed today:  Echo 06/13/2019 IMPRESSIONS    1. Left ventricular ejection fraction, by visual estimation, is 55 to 60%. The left ventricle has normal function. There is mildly increased left ventricular hypertrophy.  2. The left ventricle has no regional wall motion abnormalities.  3. Global right ventricle has low normal systolic function.The right ventricular size is normal. No increase in right ventricular wall thickness.  4. Left atrial size was normal.  5. Right atrial size was normal.  6. Mild mitral annular calcification.  7. The mitral valve is degenerative. Moderate mitral valve regurgitation.  8. The tricuspid valve is grossly normal.  9. The aortic valve is tricuspid. Aortic valve regurgitation is not visualized. No evidence of  aortic valve sclerosis or stenosis. 10. The pulmonic valve was grossly normal. Pulmonic valve regurgitation is not visualized. 11. Normal pulmonary artery systolic pressure. 12. The inferior vena cava is normal in size with greater than 50% respiratory variability, suggesting right atrial pressure of 3 mmHg.   Cath 06/16/2019  Prox LAD lesion is 25% stenosed.  Prox RCA lesion is 95% stenosed.  A drug-eluting stent was successfully placed using a SYNERGY XD [id number], postdilated to 2.5 mm.  Post intervention, there is a 0% residual stenosis.  The left ventricular systolic function is normal.  LV end diastolic pressure is normal.  The left ventricular ejection fraction is 55-65% by visual estimate.  There is no aortic valve stenosis.   Continue dual antiplatelet therapy.  Would check with Pharmacy about interactions of Plavix with Tagrisso.  If there is an interaction, would switch to Brilinta.    Could consider discharge tonight after bed rest, if she is feeling well and cardiac rehab visit can be arranged in the hospital.    EKG:  EKG is ordered today.  The ekg ordered today demonstrates normal sinus rhythm without significant ST-T wave changes  Recent Labs: 06/12/2019: ALT 25 06/13/2019: B Natriuretic Peptide 177.4; TSH 1.774 06/17/2019: BUN 17; Creatinine, Ser 1.02; Magnesium 1.8; Potassium 3.6; Sodium 140 07/03/2019: Hemoglobin 11.6; Platelets 291  Recent Lipid Panel    Component Value Date/Time   CHOL 213 (H) 06/13/2019 0100   TRIG 63 06/13/2019 0100   HDL 52 06/13/2019 0100   CHOLHDL 4.1 06/13/2019 0100   VLDL 13 06/13/2019 0100   LDLCALC 148 (H) 06/13/2019 0100    Physical Exam:    VS:  BP (!) 144/68   Pulse 66   Temp (!) 95.5 F (35.3 C)   Ht 5\' 4"  (1.626 m)   Wt 132 lb 9.6 oz (60.1 kg)   LMP  (LMP Unknown)   SpO2 100%   BMI 22.76 kg/m     Wt  Readings from Last 3 Encounters:  07/03/19 132 lb 9.6 oz (60.1 kg)  06/17/19 132 lb 12.8 oz (60.2 kg)   05/05/19 131 lb 4.8 oz (59.6 kg)     GEN:  Well nourished, well developed in no acute distress HEENT: Normal NECK: No JVD; No carotid bruits LYMPHATICS: No lymphadenopathy CARDIAC: RRR, no murmurs, rubs, gallops RESPIRATORY:  Clear to auscultation without rales, wheezing or rhonchi  ABDOMEN: Soft, non-tender, non-distended MUSCULOSKELETAL:  No edema; No deformity  SKIN: Warm and dry NEUROLOGIC:  Alert and oriented x 3 PSYCHIATRIC:  Normal affect   ASSESSMENT:    1. Coronary artery disease involving native coronary artery of native heart without angina pectoris   2. Bruit   3. Medication management   4. Hyperlipidemia, unspecified hyperlipidemia type   5. Stage 4 malignant neoplasm of lung, unspecified laterality ([place])   6. Essential hypertension    PLAN:    In order of problems listed above:  1. CAD: Recently underwent DES to RCA.  She has been compliant with aspirin and Plavix therapy.  Patient has gone back to the gym and has been able to exercise without any issue  2. Carotid bruit: Proceed with carotid Doppler  3. Hyperlipidemia: On Lipitor 80 mg daily.  Fasting lipid panel and LFT in 6 to 8 weeks  4. Hypertension: Stop hydrochlorothiazide and start on carvedilol 3.125 mg twice daily  5. Stage IV lung cancer: Followed by oncology service.  Dr. [name]    Medication Adjustments/Labs and Tests Ordered: Current medicines are reviewed at length with the patient today.  Concerns regarding medicines are outlined above.  Orders Placed This Encounter  Procedures  . CBC  . Lipid panel  . Hepatic function panel  . EKG 12-Lead  . VAS US CAROTID   Meds ordered this encounter  Medications  . clopidogrel (PLAVIX) 75 MG tablet    Sig: Take 1 tablet (75 mg total) by mouth daily with breakfast.    Dispense:  90 tablet    Refill:  3  . atorvastatin (LIPITOR) 80 MG tablet    Sig: Take 1 tablet (80 mg total) by mouth daily at 6 PM.    Dispense:  90 tablet    Refill:  3   . carvedilol (COREG) 3.125 MG tablet    Sig: Take 1 tablet (3.125 mg total) by mouth 2 (two) times daily with a meal.    Dispense:  180 tablet    Refill:  1    Patient Instructions  Medication Instructions:   STOP HCTZ-HYDROCHLOROTHIAZIDE   START CARVEDILOL 3.125 MG 2 TIMES A DAY *If you need a refill on your cardiac medications before your next appointment, please call your pharmacy*  Lab Work: Your physician recommends that you return for lab work TODAY and again in 6-8 weeks:  CBC-TODAY  FASTING LIPID PANEL-DO NOT EAT OR DRINK PAST MIDNIGHT (OK TO [place])  HEPATIC (LIVER) FUNCTION TEST  If you have labs (blood work) drawn today and your tests are completely normal, you will receive your results only by: Marland Kitchen MyChart Message (if you have MyChart) OR . A paper copy in the mail If you have any lab test that is abnormal or we need to change your treatment, we will call you to review the results.  Testing/Procedures: Your physician has requested that you have a carotid duplex. This test is an ultrasound of the carotid arteries in your neck. It looks at blood flow through these arteries that supply the brain with  blood. Allow one hour for this exam. There are no restrictions or special instructions.   PLEASE SCHEDULE FOR 1 MONTH  Follow-Up: At [facility], you and your health needs are our priority.  As part of our continuing mission to provide you with exceptional heart care, we have created designated Provider Care Teams.  These Care Teams include your primary Cardiologist (physician) and Advanced Practice Providers (APPs -  Physician Assistants and Nurse Practitioners) who all work together to provide you with the care you need, when you need it.  Your next appointment:   2 month(s)  The format for your next appointment:   In Person  Provider:   Oswaldo Milian, MD  Other Instructions      Signed, Almyra Deforest, [place]  07/05/2019 11:30 PM    [place]

## 2019-07-05 ENCOUNTER — Encounter: Payer: Self-pay | Admitting: Physician Assistant

## 2019-07-07 ENCOUNTER — Telehealth: Payer: Self-pay | Admitting: Internal Medicine

## 2019-07-07 ENCOUNTER — Encounter: Payer: Self-pay | Admitting: Internal Medicine

## 2019-07-07 NOTE — Telephone Encounter (Signed)
Returned patient's phone call regarding rescheduling 12/28 missed appointment, per patient's request appointment has moved to 01/19. Patient has gotten lab work done days piror and would like provider to see those results.  Message to provider.

## 2019-07-08 ENCOUNTER — Other Ambulatory Visit: Payer: Self-pay

## 2019-07-08 ENCOUNTER — Telehealth: Payer: Self-pay | Admitting: Internal Medicine

## 2019-07-08 ENCOUNTER — Encounter: Payer: Self-pay | Admitting: Internal Medicine

## 2019-07-08 ENCOUNTER — Inpatient Hospital Stay: Payer: Medicare HMO | Attending: Internal Medicine | Admitting: Internal Medicine

## 2019-07-08 DIAGNOSIS — C3491 Malignant neoplasm of unspecified part of right bronchus or lung: Secondary | ICD-10-CM | POA: Diagnosis not present

## 2019-07-08 DIAGNOSIS — C3411 Malignant neoplasm of upper lobe, right bronchus or lung: Secondary | ICD-10-CM | POA: Insufficient documentation

## 2019-07-08 DIAGNOSIS — C3490 Malignant neoplasm of unspecified part of unspecified bronchus or lung: Secondary | ICD-10-CM

## 2019-07-08 DIAGNOSIS — Z5111 Encounter for antineoplastic chemotherapy: Secondary | ICD-10-CM

## 2019-07-08 NOTE — Telephone Encounter (Signed)
Called patient regarding upcoming my chart visit, patient is now notified.

## 2019-07-08 NOTE — Progress Notes (Signed)
[facility] Telephone:([phone]) [phone]   Fax:(336) [phone]  PROGRESS NOTE FOR TELEMEDICINE VISITS  Allison [name], [place] [address]  I connected [phone] on 07/08/19 at  9:45 AM EST by video enabled telemedicine visit and verified that I am speaking with the correct person using two identifiers.   I discussed the limitations, risks, security and privacy concerns of performing an evaluation and management service by telemedicine and the availability of in-person appointments. I also discussed with the patient that there may be a patient responsible charge related to this service. The patient expressed understanding and agreed to proceed.  Other persons participating in the visit and their role in the encounter:  None  Patient's location:  Home Provider's location: [facility] [place]  DIAGNOSIS: Stage IV (T2 a,N2, M1a)non-small cell lung cancer, adenocarcinoma diagnosed in July 2019 and presented with right upper lobe lung mass in addition to mediastinal lymphadenopathy as well as bilateral pulmonary nodules and malignant right pleural effusion.  Biomarker Findings Microsatellite status - Cannot Be Determined Tumor Mutational Burden - Cannot Be Determined Genomic Findings For a complete list of the genes assayed, please refer to the Appendix. EGFR exon 19 deletion ([id number]) TP53 Y220C 7 Disease relevant genes with no reportable alterations: KRAS, ALK, BRAF, MET, RET, ERBB2, ROS1   PRIOR THERAPY: Status post right Pleurx catheter placement by Dr. Prescott Gum for drainage of malignant right pleural effusion.  CURRENT THERAPY: Tagrisso 80 mg p.o. daily.  First dose was given on 01/29/2018.  Status post 17 months of treatment.  INTERVAL HISTORY: Allison [name] 70 y.o. female has a MyChart virtual visit with me today for evaluation and recommendation regarding her condition.  The patient is feeling much better today.  She was  admitted to the hospital last month with NSTEMI and had a cardiac catheterization and stent placement done.  She is currently on treatment with aspirin as well as Plavix and Coreg by her cardiologist.  She denied having any current chest pain, shortness of breath except with exertion with no cough or hemoptysis.  She denied having any fever or chills.  She has no nausea, vomiting, diarrhea or constipation.  She denied having any headache or visual changes.  She had CT angiogram of the chest during her hospitalization in December 2020 that showed no evidence for disease progression.  She is here today for evaluation and recommendation regarding her condition.  MEDICAL HISTORY: Past Medical History:  Diagnosis Date  . Anxiety   . Arthritis   . Asthma    exercise induced  . Depression    PMH  . GERD (gastroesophageal reflux disease)   . Glaucoma   . Hypertension   . lung ca dx'd 11/2017   right  . Malignant pleural effusion    right  . PONV (postoperative nausea and vomiting)   . Pre-diabetes   . Raynaud's disease   . Raynaud's disease     ALLERGIES:  is allergic to penicillins and vicodin [hydrocodone-acetaminophen].  MEDICATIONS:  Current Outpatient Medications  Medication Sig Dispense Refill  . acetaminophen (TYLENOL) 500 MG tablet Take 1,000 mg by mouth every 6 (six) hours as needed (pain).     Marland Kitchen ALPRAZolam (XANAX) 0.25 MG tablet Take 0.25 mg by mouth 2 (two) times daily as needed for anxiety.     Marland Kitchen amLODipine (NORVASC) 5 MG tablet Take 5 mg by mouth daily.  3  . aspirin 81 MG chewable tablet Chew 1 tablet (81 mg  total) by mouth daily.    Marland Kitchen atorvastatin (LIPITOR) 80 MG tablet Take 1 tablet (80 mg total) by mouth daily at 6 PM. 90 tablet 3  . Biotin (HM BIOTIN) 5 MG CAPS Take 5,000 mg by mouth daily.    . carvedilol (COREG) 3.125 MG tablet Take 1 tablet (3.125 mg total) by mouth 2 (two) times daily with a meal. 180 tablet 1  . clopidogrel (PLAVIX) 75 MG tablet Take 1 tablet (75 mg  total) by mouth daily with breakfast. 90 tablet 3  . dorzolamide-timolol (COSOPT) 22.3-6.8 MG/ML ophthalmic solution Instill 1 drop in both eyes twice daily    . ezetimibe (ZETIA) 10 MG tablet Take 10 mg by mouth daily.     Marland Kitchen latanoprost (XALATAN) 0.005 % ophthalmic solution Place 1 drop into both eyes at bedtime.     Marland Kitchen loperamide (IMODIUM A-D) 2 MG tablet Take 2 mg by mouth 4 (four) times daily as needed for diarrhea or loose stools.    . Melatonin 5 MG CAPS Take 10 mg by mouth at bedtime as needed (sleep).    . nitroGLYCERIN (NITROSTAT) 0.4 MG SL tablet Place 1 tablet (0.4 mg total) under the tongue every 5 (five) minutes x 3 doses as needed for chest pain. 30 tablet 12  . osimertinib mesylate (TAGRISSO) 80 MG tablet Take 1 tablet (80 mg total) by mouth daily. Refill Faxed to [facility]/2020 30 tablet 3  . VITAMIN D, CHOLECALCIFEROL, PO Take 5,000 Units by mouth daily.      No current facility-administered medications for this visit.    SURGICAL HISTORY:  Past Surgical History:  Procedure Laterality Date  . ABDOMINAL HYSTERECTOMY     partial  . CHEST TUBE INSERTION Right 01/01/2018   Procedure: INSERTION PLEURAL DRAINAGE CATHETER;  Surgeon: Ivin Poot, MD;  Location: [facility];  Service: Thoracic;  Laterality: Right;  . COLONOSCOPY    . CORONARY STENT INTERVENTION N/A 06/16/2019   Procedure: CORONARY STENT INTERVENTION;  Surgeon: Jettie Booze, MD;  Location: [facility];  Service: Cardiovascular;  Laterality: N/A;  . DILATION AND CURETTAGE OF UTERUS    . EYE SURGERY     due to Glaucoma  . LEFT HEART CATH AND CORONARY ANGIOGRAPHY N/A 06/16/2019   Procedure: LEFT HEART CATH AND CORONARY ANGIOGRAPHY;  Surgeon: Jettie Booze, MD;  Location: [facility];  Service: Cardiovascular;  Laterality: N/A;  . REMOVAL OF PLEURAL DRAINAGE CATHETER Right 11/07/2018   Procedure: REMOVAL OF PLEURAL DRAINAGE CATHETER;  Surgeon: Ivin Poot, MD;  Location: [facility];  Service:  Thoracic;  Laterality: Right;  . ROTATOR CUFF REPAIR    . TUBAL LIGATION    . WISDOM TOOTH EXTRACTION      REVIEW OF SYSTEMS:  A comprehensive review of systems was negative except for: Constitutional: positive for fatigue   LABORATORY DATA: Lab Results  Component Value Date   WBC 5.6 07/03/2019   HGB 11.6 07/03/2019   HCT 36.0 07/03/2019   MCV 81 07/03/2019   PLT 291 07/03/2019      Chemistry      Component Value Date/Time   NA 140 06/17/2019 0356   K 3.6 06/17/2019 0356   CL 107 06/17/2019 0356   CO2 24 06/17/2019 0356   BUN 17 06/17/2019 0356   CREATININE 1.02 (H) 06/17/2019 0356   CREATININE 0.92 05/02/2019 0814      Component Value Date/Time   CALCIUM 9.2 06/17/2019 0356   ALKPHOS 56 06/12/2019 1612  AST 43 (H) 06/12/2019 1612   AST 26 05/02/2019 0814   ALT 25 06/12/2019 1612   ALT 27 05/02/2019 0814   BILITOT 0.2 (L) 06/12/2019 1612   BILITOT 0.3 05/02/2019 0814       RADIOGRAPHIC STUDIES: DG Chest 2 View  Result Date: 06/12/2019 CLINICAL DATA:  Chest pain for 1 week, weakness, history hypertension, RIGHT-side lung cancer, Raynaud disease EXAM: CHEST - 2 VIEW COMPARISON:  04/09/2019 FINDINGS: Normal heart size, mediastinal contours, and pulmonary vascularity. RIGHT apical nodule 22 mm long axis, previously 20 mm. Remaining lungs clear. No acute infiltrate, pleural effusion or pneumothorax. Bones demineralized. IMPRESSION: Persistent RIGHT apical nodule question minimally larger versus projectional difference. No acute abnormalities. Electronically Signed   By: Lavonia Dana M.D.   On: 06/12/2019 16:32   CT Angio Chest PE W and/or Wo Contrast  Result Date: 06/12/2019 CLINICAL DATA:  69 year old female with shortness of breath. History of lung cancer. EXAM: CT ANGIOGRAPHY CHEST WITH CONTRAST TECHNIQUE: Multidetector CT imaging of the chest was performed using the standard protocol during bolus administration of intravenous contrast. Multiplanar CT image  reconstructions and MIPs were obtained to evaluate the vascular anatomy. CONTRAST:  113m OMNIPAQUE IOHEXOL 350 MG/ML SOLN COMPARISON:  Chest radiograph dated 06/12/2019 and CT dated 05/02/2019. FINDINGS: Cardiovascular: There is no cardiomegaly or pericardial effusion. Mild atherosclerotic calcification of the thoracic aorta. No aneurysmal dilatation. Apparent focal filling defect within the right upper lobe pulmonary artery branch (series 5, image 101 and coronal series 7, image 61) is likely artifactual. A nonocclusive embolus is less likely but not excluded. No other acute pulmonary artery embolus identified. Mediastinum/Nodes: There is no hilar or mediastinal adenopathy. The esophagus and the thyroid gland are grossly unremarkable as visualized. No mediastinal fluid collection. Lungs/Pleura: There is a small right pleural effusion similar to prior CT. Diffuse interstitial prominence may represent mild edema. There is a 2.5 x 1.7 cm nodule with irregular and spiculated margins in the right apex which is similar to prior CT. A 1 cm nodular density in the right lower lobe along the major fissure (series 6, image 79) appears similar to prior CT. Overall no interval change in the appearance of the lungs since the prior CT. There is no pneumothorax. The central airways are patent. Upper Abdomen: No acute abnormality. Musculoskeletal: No chest wall abnormality. No acute or significant osseous findings. Review of the MIP images confirms the above findings. IMPRESSION: 1. Artifact versus less likely nonocclusive thrombus in the right upper lobe pulmonary artery branch. 2. Small right pleural effusion similar to prior CT. 3. Right apical and right lower lobe nodular densities as seen on the prior CT. Overall no interval change in the appearance of the lungs since the prior CT. 4. Aortic Atherosclerosis (ICD10-I70.0). Electronically Signed   By: AAnner CreteM.D.   On: 06/12/2019 19:05   CARDIAC  CATHETERIZATION  Result Date: 06/16/2019  Prox LAD lesion is 25% stenosed.  Prox RCA lesion is 95% stenosed.  A drug-eluting stent was successfully placed using a SYNERGY XD [id number], postdilated to 2.5 mm.  Post intervention, there is a 0% residual stenosis.  The left ventricular systolic function is normal.  LV end diastolic pressure is normal.  The left ventricular ejection fraction is 55-65% by visual estimate.  There is no aortic valve stenosis.  Continue dual antiplatelet therapy.  Would check with Pharmacy about interactions of Plavix with Tagrisso.  If there is an interaction, would switch to Brilinta.  Could consider discharge tonight after  bed rest, if she is feeling well and cardiac rehab visit can be arranged in the hospital.   ECHOCARDIOGRAM COMPLETE  Result Date: 06/13/2019   ECHOCARDIOGRAM REPORT   Patient Name:   Allison [name] Date of Exam: 06/13/2019 Medical Rec #:  [id number]       Height:       65.0 in Accession #:    [id number]      Weight:       131.4 lb Date of Birth:  Oct 22, 1950       BSA:          1.65 m Patient Age:    70 years        BP:           97/57 mmHg Patient Gender: F               HR:           55 bpm. Exam Location:  Inpatient Procedure: 2D Echo, Color Doppler and Cardiac Doppler Indications:    NSTEMI  History:        Patient has no prior history of Echocardiogram examinations.                 Risk Factors:Hypertension and Dyslipidemia.  Sonographer:    Raquel Sarna Senior RDCS Referring Phys: [id number] [place]  1. Left ventricular ejection fraction, by visual estimation, is 55 to 60%. The left ventricle has normal function. There is mildly increased left ventricular hypertrophy.  2. The left ventricle has no regional wall motion abnormalities.  3. Global right ventricle has low normal systolic function.The right ventricular size is normal. No increase in right ventricular wall thickness.  4. Left atrial size was normal.  5. Right atrial size was  normal.  6. Mild mitral annular calcification.  7. The mitral valve is degenerative. Moderate mitral valve regurgitation.  8. The tricuspid valve is grossly normal.  9. The aortic valve is tricuspid. Aortic valve regurgitation is not visualized. No evidence of aortic valve sclerosis or stenosis. 10. The pulmonic valve was grossly normal. Pulmonic valve regurgitation is not visualized. 11. Normal pulmonary artery systolic pressure. 12. The inferior vena cava is normal in size with greater than 50% respiratory variability, suggesting right atrial pressure of 3 mmHg. FINDINGS  Left Ventricle: Left ventricular ejection fraction, by visual estimation, is 55 to 60%. The left ventricle has normal function. The left ventricle has no regional wall motion abnormalities. There is mildly increased left ventricular hypertrophy. Asymmetric left ventricular hypertrophy of the posterior wall. Left ventricular diastolic parameters were normal. Right Ventricle: The right ventricular size is normal. No increase in right ventricular wall thickness. Global RV systolic function is has low normal systolic function. The tricuspid regurgitant velocity is 2.35 m/s, and with an assumed right atrial pressure of 3 mmHg, the estimated right ventricular systolic pressure is normal at 25.1 mmHg. Left Atrium: Left atrial size was normal in size. Right Atrium: Right atrial size was normal in size Pericardium: There is no evidence of pericardial effusion. Mitral Valve: The mitral valve is degenerative in appearance. There is mild thickening of the mitral valve leaflet(s). Mild mitral annular calcification. Moderate mitral valve regurgitation. Tricuspid Valve: The tricuspid valve is grossly normal. Tricuspid valve regurgitation moderate. Aortic Valve: The aortic valve is tricuspid. Aortic valve regurgitation is not visualized. The aortic valve is structurally normal, with no evidence of sclerosis or stenosis. Pulmonic Valve: The pulmonic valve was  grossly normal. Pulmonic valve regurgitation is not  visualized. Pulmonic regurgitation is not visualized. Aorta: The aortic root is normal in size and structure. Venous: The inferior vena cava is normal in size with greater than 50% respiratory variability, suggesting right atrial pressure of 3 mmHg. IAS/Shunts: No atrial level shunt detected by color flow Doppler.  LEFT VENTRICLE PLAX 2D LVIDd:         4.00 cm  Diastology LVIDs:         2.80 cm  LV e' lateral:   8.27 cm/s LV PW:         1.10 cm  LV E/e' lateral: 11.0 LV IVS:        0.70 cm  LV e' medial:    8.59 cm/s LVOT diam:     1.60 cm  LV E/e' medial:  10.5 LV SV:         40 ml LV SV Index:   24.44 LVOT Area:     2.01 cm  RIGHT VENTRICLE RV S prime:     9.03 cm/s TAPSE (M-mode): 2.4 cm LEFT ATRIUM             Index       RIGHT ATRIUM           Index LA diam:        3.50 cm 2.12 cm/m  RA Area:     16.10 cm LA Vol (A2C):   60.8 ml 36.74 ml/m RA Volume:   42.60 ml  25.75 ml/m LA Vol (A4C):   44.6 ml 26.95 ml/m LA Biplane Vol: 52.6 ml 31.79 ml/m  AORTIC VALVE LVOT Vmax:   85.80 cm/s LVOT Vmean:  56.700 cm/s LVOT VTI:    0.181 m  AORTA Ao Root diam: 2.30 cm Ao Asc diam:  2.80 cm MITRAL VALVE                        TRICUSPID VALVE MV Area (PHT): 3.77 cm             TR Peak grad:   22.1 mmHg MV PHT:        58.29 msec           TR Vmax:        235.00 cm/s MV Decel Time: 201 msec MR Peak grad:    130.4 mmHg         SHUNTS MR Mean grad:    88.0 mmHg          Systemic VTI:  0.18 m MR Vmax:         571.00 cm/s        Systemic Diam: 1.60 cm MR Vmean:        452.0 cm/s MR PISA:         1.01 cm MR PISA Eff ROA: 5 mm MR PISA Radius:  0.40 cm MV E velocity: 90.60 cm/s 103 cm/s MV A velocity: 51.40 cm/s 70.3 cm/s MV E/A ratio:  1.76       1.5  Kate Sable MD Electronically signed by Kate Sable MD Signature Date/Time: 06/13/2019/9:33:49 AM    Final     ASSESSMENT AND PLAN: This is a very pleasant 69 years old never smoker African-American female  recently with a stage IV non-small cell lung cancer, adenocarcinoma with positive EGFR mutation with deletion in exon 19. The patient was started on treatment with Tagrisso 80 mg p.o. daily status post 17 months of treatment. The patient is doing fine today with no concerning complaints.  She  was recently diagnosed with coronary artery disease status post cardiac catheterization and stent placement.  She is currently on aspirin, Plavix and Coreg. The patient has been tolerating her treatment with Tagrisso fairly well. I recommended for her to continue her current treatment with the same dose. I will see her back for follow-up visit in 6 weeks for evaluation and repeat blood work. She was advised to call immediately if she has any concerning symptoms in the interval. I discussed the assessment and treatment plan with the patient. The patient was provided an opportunity to ask questions and all were answered. The patient agreed with the plan and demonstrated an understanding of the instructions.   The patient was advised to call back or seek an in-person evaluation if the symptoms worsen or if the condition fails to improve as anticipated.  Eilleen Kempf, MD 07/08/2019 11:09 AM  Disclaimer: This note was dictated with voice recognition software. Similar sounding words can inadvertently be transcribed and may not be corrected upon review.

## 2019-07-09 DIAGNOSIS — H401131 Primary open-angle glaucoma, bilateral, mild stage: Secondary | ICD-10-CM | POA: Diagnosis not present

## 2019-07-09 DIAGNOSIS — H35371 Puckering of macula, right eye: Secondary | ICD-10-CM | POA: Diagnosis not present

## 2019-07-09 DIAGNOSIS — H2513 Age-related nuclear cataract, bilateral: Secondary | ICD-10-CM | POA: Diagnosis not present

## 2019-07-09 DIAGNOSIS — H04123 Dry eye syndrome of bilateral lacrimal glands: Secondary | ICD-10-CM | POA: Diagnosis not present

## 2019-07-15 ENCOUNTER — Other Ambulatory Visit: Payer: Self-pay | Admitting: Cardiothoracic Surgery

## 2019-07-15 DIAGNOSIS — J90 Pleural effusion, not elsewhere classified: Secondary | ICD-10-CM

## 2019-07-16 ENCOUNTER — Other Ambulatory Visit: Payer: Self-pay

## 2019-07-16 ENCOUNTER — Ambulatory Visit
Admission: RE | Admit: 2019-07-16 | Discharge: 2019-07-16 | Disposition: A | Discharge status: Home / Self Care | Payer: Medicare HMO | Source: Ambulatory Visit | Attending: Cardiothoracic Surgery | Admitting: Cardiothoracic Surgery

## 2019-07-16 ENCOUNTER — Ambulatory Visit: Payer: Medicare HMO | Admitting: Cardiothoracic Surgery

## 2019-07-16 ENCOUNTER — Encounter: Payer: Self-pay | Admitting: Cardiothoracic Surgery

## 2019-07-16 VITALS — BP 134/75 | HR 92 | Temp 98.1°F | Resp 20 | Ht 64.0 in | Wt 129.0 lb

## 2019-07-16 DIAGNOSIS — Z87898 Personal history of other specified conditions: Secondary | ICD-10-CM

## 2019-07-16 DIAGNOSIS — J90 Pleural effusion, not elsewhere classified: Secondary | ICD-10-CM

## 2019-07-16 DIAGNOSIS — C3491 Malignant neoplasm of unspecified part of right bronchus or lung: Secondary | ICD-10-CM | POA: Diagnosis not present

## 2019-07-16 DIAGNOSIS — R911 Solitary pulmonary nodule: Secondary | ICD-10-CM | POA: Diagnosis not present

## 2019-07-16 NOTE — Progress Notes (Signed)
PCP is [name], MD Referring Provider is [name], MD  Chief Complaint  Patient presents with  . Lung Cancer    3 month f/u with CXR    HPI: Patient returns with chest x-ray for review.  Patient has history of stage IV adenocarcinoma of the right upper lobe with malignant effusion.  This was treated with a right Pleurx catheter which was removed last fall.  She continues on Tagrisso as directed by Dr. [name].  Chest x-ray today shows stable small right upper lobe nodule.  There is no pleural effusion.   Right after Christmas the patient had increased chest pressure and was found by cath to have a tight proximal RCA stenosis treated successfully with PCI.  She has done well since then.  She has a positive family history of CAD.  Past Medical History:  Diagnosis Date  . Anxiety   . Arthritis   . Asthma    exercise induced  . Depression    PMH  . GERD (gastroesophageal reflux disease)   . Glaucoma   . Hypertension   . lung ca dx'd 11/2017   right  . Malignant pleural effusion    right  . PONV (postoperative nausea and vomiting)   . Pre-diabetes   . Raynaud's disease   . Raynaud's disease     Past Surgical History:  Procedure Laterality Date  . ABDOMINAL HYSTERECTOMY     partial  . CHEST TUBE INSERTION Right 01/01/2018   Procedure: INSERTION PLEURAL DRAINAGE CATHETER;  Surgeon: Ivin Poot, MD;  Location: [facility];  Service: Thoracic;  Laterality: Right;  . COLONOSCOPY    . CORONARY STENT INTERVENTION N/A 06/16/2019   Procedure: CORONARY STENT INTERVENTION;  Surgeon: Jettie Booze, MD;  Location: [facility];  Service: Cardiovascular;  Laterality: N/A;  . DILATION AND CURETTAGE OF UTERUS    . EYE SURGERY     due to Glaucoma  . LEFT HEART CATH AND CORONARY ANGIOGRAPHY N/A 06/16/2019   Procedure: LEFT HEART CATH AND CORONARY ANGIOGRAPHY;  Surgeon: Jettie Booze, MD;  Location: [facility];  Service: Cardiovascular;   Laterality: N/A;  . REMOVAL OF PLEURAL DRAINAGE CATHETER Right 11/07/2018   Procedure: REMOVAL OF PLEURAL DRAINAGE CATHETER;  Surgeon: Ivin Poot, MD;  Location: [facility];  Service: Thoracic;  Laterality: Right;  . ROTATOR CUFF REPAIR    . TUBAL LIGATION    . WISDOM TOOTH EXTRACTION      Family History  Problem Relation Age of Onset  . Heart disease Sister   . Heart disease Brother   . Lung cancer Other     Social History Social History   Tobacco Use  . Smoking status: Never Smoker  . Smokeless tobacco: Never Used  Substance Use Topics  . Alcohol use: Yes    Comment: up to 3 drinks per week  . Drug use: No    Comment: CBD oil     Current Outpatient Medications  Medication Sig Dispense Refill  . acetaminophen (TYLENOL) 500 MG tablet Take 1,000 mg by mouth every 6 (six) hours as needed (pain).     Marland Kitchen ALPRAZolam (XANAX) 0.25 MG tablet Take 0.25 mg by mouth 2 (two) times daily as needed for anxiety.     Marland Kitchen amLODipine (NORVASC) 5 MG tablet Take 5 mg by mouth daily.  3  . aspirin 81 MG chewable tablet Chew 1 tablet (81 mg total) by mouth daily.    Marland Kitchen atorvastatin (LIPITOR) 80 MG  tablet Take 1 tablet (80 mg total) by mouth daily at 6 PM. 90 tablet 3  . Biotin (HM BIOTIN) 5 MG CAPS Take 5,000 mg by mouth daily.    . carvedilol (COREG) 3.125 MG tablet Take 1 tablet (3.125 mg total) by mouth 2 (two) times daily with a meal. 180 tablet 1  . clopidogrel (PLAVIX) 75 MG tablet Take 1 tablet (75 mg total) by mouth daily with breakfast. 90 tablet 3  . dorzolamide-timolol (COSOPT) 22.3-6.8 MG/ML ophthalmic solution Instill 1 drop in both eyes twice daily    . ezetimibe (ZETIA) 10 MG tablet Take 10 mg by mouth daily.     Marland Kitchen latanoprost (XALATAN) 0.005 % ophthalmic solution Place 1 drop into both eyes at bedtime.     Marland Kitchen loperamide (IMODIUM A-D) 2 MG tablet Take 2 mg by mouth 4 (four) times daily as needed for diarrhea or loose stools.    . Melatonin 5 MG CAPS Take 10 mg by mouth at bedtime as  needed (sleep).    . nitroGLYCERIN (NITROSTAT) 0.4 MG SL tablet Place 1 tablet (0.4 mg total) under the tongue every 5 (five) minutes x 3 doses as needed for chest pain. 30 tablet 12  . osimertinib mesylate (TAGRISSO) 80 MG tablet Take 1 tablet (80 mg total) by mouth daily. Refill Faxed to [facility]/24/2020 30 tablet 3  . VITAMIN D, CHOLECALCIFEROL, PO Take 5,000 Units by mouth daily.      No current facility-administered medications for this visit.    Allergies  Allergen Reactions  . Penicillins Other (See Comments)    [facility] PATIENT HAS HAD A PCN REACTION WITH IMMEDIATE RASH, FACIAL/TONGUE/THROAT SWELLING, SOB, OR LIGHTHEADEDNESS WITH HYPOTENSION:  #  #  YES  #  # Has patient had a PCN reaction causing severe rash involving mucus membranes or skin necrosis: No Has patient had a PCN reaction that required hospitalization: No Has patient had a PCN reaction occurring within the last 10 years: No   . Vicodin [Hydrocodone-Acetaminophen] Other (See Comments)    Sped up heart and breathing    Review of Systems   No fever shortness of breath She is back on her walking schedule No weight loss She has a right carotid bruit [asymptomatic] and a carotid Doppler is pending  BP 134/75 (BP Location: Right Arm, Patient Position: Sitting, Cuff Size: Normal)   Pulse 92   Temp 98.1 F (36.7 C) (Skin)   Resp 20   Ht 5\' 4"  (0.017 m)   Wt 129 lb (58.5 kg)   LMP  (LMP Unknown)   SpO2 (!) 88% Comment: RA-pt has Raynaud's syndrome, so may not be accurate read  BMI 22.14 kg/m  Physical Exam      Exam    General- alert and comfortable    Neck- no JVD, no cervical adenopathy palpable, 2+ right carotid bruit   Lungs- clear without rales, wheezes   Cor- regular rate and rhythm, no murmur , gallop   Abdomen- soft, non-tender   Extremities - warm, non-tender, minimal edema   Neuro- oriented, appropriate, no focal weakness  warm all thank still  Diagnostic Tests: Today's chest x-ray images  personally reviewed showing small right upper lobe nodule otherwise clear.  There is no pleural effusion.  Impression: Resolution of malignant right pleural effusion.  Pleurx catheter removed mid 2020.  Plan: I will continue to see the patient back with a chest x-ray in 3 months.   Len Childs, MD [facility] (  [phone] 

## 2019-07-17 ENCOUNTER — Other Ambulatory Visit: Payer: Self-pay | Admitting: Pharmacist

## 2019-07-17 DIAGNOSIS — C3491 Malignant neoplasm of unspecified part of right bronchus or lung: Secondary | ICD-10-CM

## 2019-07-17 MED ORDER — OSIMERTINIB MESYLATE 80 MG PO TABS: 80.0000 mg | ORAL_TABLET | Freq: Every day | ORAL | 3 refills | Status: DC

## 2019-07-18 ENCOUNTER — Telehealth (HOSPITAL_COMMUNITY): Payer: Self-pay

## 2019-07-18 ENCOUNTER — Telehealth (HOSPITAL_COMMUNITY): Payer: Self-pay | Admitting: *Deleted

## 2019-07-21 ENCOUNTER — Encounter (HOSPITAL_COMMUNITY)
Admission: RE | Admit: 2019-07-21 | Discharge: 2019-07-21 | Disposition: A | Discharge status: Home / Self Care | Payer: Medicare HMO | Source: Ambulatory Visit | Attending: Cardiology | Admitting: Cardiology

## 2019-07-21 ENCOUNTER — Other Ambulatory Visit: Payer: Self-pay

## 2019-07-21 ENCOUNTER — Telehealth (HOSPITAL_COMMUNITY): Payer: Self-pay | Admitting: *Deleted

## 2019-07-21 ENCOUNTER — Telehealth: Payer: Self-pay | Admitting: Medical Oncology

## 2019-07-21 DIAGNOSIS — I214 Non-ST elevation (NSTEMI) myocardial infarction: Secondary | ICD-10-CM | POA: Insufficient documentation

## 2019-07-21 DIAGNOSIS — Z955 Presence of coronary angioplasty implant and graft: Secondary | ICD-10-CM | POA: Insufficient documentation

## 2019-07-21 NOTE — Telephone Encounter (Signed)
Spoke with [name] completed health history. Confirmed orientation appointment for tomorrow. Allison [name] is interested in participating in virtual rehab and is unsure about in person phase 2 cardiac rehab. Will decide tomorrow.  Pt is interested in participating in Virtual Cardiac and Pulmonary Rehab. Pt advised that Virtual Cardiac and Pulmonary Rehab is provided at no cost to the patient.  Checklist:  1. Pt has smart device  ie smartphone and/or ipad for downloading an app  Yes 2. Reliable internet/wifi service    Yes 3. Understands how to use their smartphone and navigate within an app.  Yes   Pt verbalized understanding and is in agreement.            Confirm Consent - In the setting of the current Covid19 crisis, you are scheduled for a phone visit with your Cardiac or Pulmonary team member.  Just as we do with many in-gym visits, in order for you to participate in this visit, we must obtain consent.  If you'd like, I can send this to your mychart (if signed up) or email for you to review.  Otherwise, I can obtain your verbal consent now.  By agreeing to a telephone visit, we'd like you to understand that the technology does not allow for your Cardiac or Pulmonary Rehab team member to perform a physical assessment, and thus may limit their ability to fully assess your ability to perform exercise programs. If your provider identifies any concerns that need to be evaluated in person, we will make arrangements to do so.  Finally, though the technology is pretty good, we cannot assure that it will always work on either your or our end and we cannot ensure that we have a secure connection.  Cardiac and Pulmonary Rehab Telehealth visits and "At Home" cardiac and pulmonary rehab are provided at no cost to you.        Are you willing to proceed?"        STAFF: Did the patient verbally acknowledge consent to telehealth visit? Document YES/NO here: Yes     Harrell Gave RN BSN  Cardiac and Pulmonary  Rehab Staff        Date 07/21/19    @ Time 11:21AM

## 2019-07-21 NOTE — Telephone Encounter (Signed)
Yes no problem, we can draw those labs on 2/26 [facility]

## 2019-07-21 NOTE — Telephone Encounter (Signed)
Per Dr Julien Nordmann I told pt that she does not need to be seen before 3/2.

## 2019-07-21 NOTE — Telephone Encounter (Signed)
Pt asking if Dr Gardiner Rhyme will draw labs that Dr Julien Nordmann needs for march .

## 2019-07-21 NOTE — Telephone Encounter (Signed)
Cardiac Rehab Medication Review by a Pharmacist  Does the patient  feel that his/her medications are working for him/her?  yes  Has the patient been experiencing any side effects to the medications prescribed?  yes muscle tightness in hand (not joint-related). Pt thinks related to statins. Pt had muscle pain in the past with statin use but decided to give statins another try.   Does the patient measure his/her own blood pressure or blood glucose at home?  no Pt has access to blood pressure monitor at home but doesn't check it because her blood pressure is usually low-normal.  Does the patient have any problems obtaining medications due to transportation or finances?   no  Understanding of regimen: excellent Understanding of indications: excellent Potential of compliance: excellent    Pharmacist comments: Please assess pt's muscle tightness in hands and consider lowering statin dose or switch to rosuvastatin to see if better tolerated.    Berenice Bouton, PharmD PGY1 Pharmacy Resident  Please check AMION for all [place] phone numbers After 10:00 PM, call [place] [phone]  07/21/2019 9:05 AM

## 2019-07-22 ENCOUNTER — Other Ambulatory Visit: Payer: Self-pay

## 2019-07-22 ENCOUNTER — Encounter (HOSPITAL_COMMUNITY)
Admission: RE | Admit: 2019-07-22 | Discharge: 2019-07-22 | Disposition: A | Discharge status: Home / Self Care | Payer: Medicare HMO | Source: Ambulatory Visit | Attending: Diagnostic Radiology | Admitting: Diagnostic Radiology

## 2019-07-22 ENCOUNTER — Encounter (HOSPITAL_COMMUNITY): Payer: Self-pay

## 2019-07-22 VITALS — BP 112/52 | HR 64 | Temp 97.0°F | Ht 64.75 in | Wt 133.2 lb

## 2019-07-22 DIAGNOSIS — Z955 Presence of coronary angioplasty implant and graft: Secondary | ICD-10-CM

## 2019-07-22 DIAGNOSIS — I214 Non-ST elevation (NSTEMI) myocardial infarction: Secondary | ICD-10-CM

## 2019-07-22 NOTE — Progress Notes (Signed)
Reviewed home exercise guidelines with patient including endpoints, temperature precautions, target heart rate and rate of perceived exertion. Pt [facility] 1 [place], 20 [place] of [facility] exercise. Pt also uses the incline bench and has 5 lb. weights at home for her resistance training. Pt has a smart watch to check her pulse. Pt voices understanding of instructions given.   Sol Passer, MS, ACSM CEP

## 2019-07-22 NOTE — Progress Notes (Signed)
Cardiac Individual Treatment Plan  Patient Details  Name: Allison [name] MRN: [id number] Date of Birth: [birth year] Referring Provider:     CARDIAC REHAB PHASE II ORIENTATION from 07/22/2019 in [place]  Referring Provider  Beatrix Fetters, MD      Initial Encounter Date:    CARDIAC REHAB PHASE II ORIENTATION from 07/22/2019 in [place]  Date  07/22/19      Visit Diagnosis: NSTEMI (non-ST elevated myocardial infarction) [facility])  Status post coronary artery stent placement  Patient's Home Medications on Admission:  Current Outpatient Medications:  .  acetaminophen (TYLENOL) 500 MG tablet, Take 1,000 mg by mouth every 6 (six) hours as needed (pain). , Disp: , Rfl:  .  ALPRAZolam (XANAX) 0.25 MG tablet, Take 0.25 mg by mouth 2 (two) times daily as needed for anxiety. , Disp: , Rfl:  .  amLODipine (NORVASC) 5 MG tablet, Take 5 mg by mouth daily., Disp: , Rfl: 3 .  aspirin 81 MG chewable tablet, Chew 1 tablet (81 mg total) by mouth daily., Disp:  , Rfl:  .  atorvastatin (LIPITOR) 80 MG tablet, Take 1 tablet (80 mg total) by mouth daily at 6 PM., Disp: 90 tablet, Rfl: 3 .  Biotin 1000 MCG CHEW, Chew 10 mg by mouth daily., Disp: , Rfl:  .  carvedilol (COREG) 3.125 MG tablet, Take 1 tablet (3.125 mg total) by mouth 2 (two) times daily with a meal., Disp: 180 tablet, Rfl: 1 .  clopidogrel (PLAVIX) 75 MG tablet, Take 1 tablet (75 mg total) by mouth daily with breakfast., Disp: 90 tablet, Rfl: 3 .  dorzolamide-timolol (COSOPT) 22.3-6.8 MG/ML ophthalmic solution, Instill 1 drop in both eyes twice daily, Disp: , Rfl:  .  ezetimibe (ZETIA) 10 MG tablet, Take 10 mg by mouth daily. , Disp: , Rfl:  .  latanoprost (XALATAN) 0.005 % ophthalmic solution, Place 1 drop into both eyes at bedtime. , Disp: , Rfl:  .  Melatonin 5 MG CAPS, Take 10 mg by mouth at bedtime as needed (sleep)., Disp: , Rfl:  .  nitroGLYCERIN (NITROSTAT) 0.4 MG SL  tablet, Place 1 tablet (0.4 mg total) under the tongue every 5 (five) minutes x 3 doses as needed for chest pain., Disp: 30 tablet, Rfl: 12 .  osimertinib mesylate (TAGRISSO) 80 MG tablet, Take 1 tablet (80 mg total) by mouth daily., Disp: 30 tablet, Rfl: 3 .  VITAMIN D, CHOLECALCIFEROL, PO, Take 5,000 Units by mouth daily. , Disp: , Rfl:   Past Medical History: Past Medical History:  Diagnosis Date  . Anxiety   . Arthritis   . Asthma    exercise induced  . Depression    PMH  . GERD (gastroesophageal reflux disease)   . Glaucoma   . Hypertension   . lung ca dx'd 11/2017   right  . Malignant pleural effusion    right  . PONV (postoperative nausea and vomiting)   . Pre-diabetes   . Raynaud's disease   . Raynaud's disease     Tobacco Use: Social History   Tobacco Use  Smoking Status Never Smoker  Smokeless Tobacco Never Used    Labs: Recent Review Scientist, physiological    Labs for ITP Cardiac and Pulmonary Rehab Latest Ref Rng & Units 06/13/2019   Cholestrol 0 - 200 mg/dL 213(H)   LDLCALC 0 - 99 mg/dL 148(H)   HDL >40 mg/dL 52   Trlycerides <150 mg/dL 63   Hemoglobin A1c  4.8 - 5.6 % 5.5      Capillary Blood Glucose: Lab Results  Component Value Date   GLUCAP 87 12/26/2017     Exercise Target Goals: Exercise Program Goal: Individual exercise prescription set using results from initial 6 min walk test and THRR while considering  patient's activity barriers and safety.   Exercise Prescription Goal: Initial exercise prescription builds to 30-45 minutes a day of aerobic activity, 2-3 days per week.  Home exercise guidelines will be given to patient during program as part of exercise prescription that the participant will acknowledge.  Activity Barriers & Risk Stratification: Activity Barriers & Cardiac Risk Stratification - 07/22/19 0948      Activity Barriers & Cardiac Risk Stratification   Activity Barriers  Back Problems;Other (comment)    Comments  Low back pain,  degenerative arthritis in back. Left hip discomfort with walking. Right rotator cuff surgery.    Cardiac Risk Stratification  High       6 Minute Walk: 6 Minute Walk    Row Name 07/22/19 1010         6 Minute Walk   Phase  Initial     Distance  1607 feet     Walk Time  6 minutes     # of Rest Breaks  0     MPH  3.04     METS  3.6     RPE  12     Perceived Dyspnea   0     VO2 Peak  12.58     Symptoms  Yes (comment)     Comments  Low back discomfort, chronic.     Resting HR  64 bpm     Resting BP  112/52     Resting Oxygen Saturation   100 %     Exercise Oxygen Saturation  during 6 min walk  100 %     Max Ex. HR  84 bpm     Max Ex. BP  126/70     2 Minute Post BP  138/64        Oxygen Initial Assessment:   Oxygen Re-Evaluation:   Oxygen Discharge (Final Oxygen Re-Evaluation):   Initial Exercise Prescription: Initial Exercise Prescription - 07/22/19 1400      Date of Initial Exercise RX and Referring Provider   Date  07/22/19    Referring Provider  Beatrix Fetters, MD      Bike   Level  2    Minutes  15    METs  2.5      NuStep   Level  3    SPM  85    Minutes  15    METs  2.5      Prescription Details   Frequency (times per week)  3    Duration  Progress to 30 minutes of continuous aerobic without signs/symptoms of physical distress      Intensity   THRR 40-80% of Max Heartrate  61-122    Ratings of Perceived Exertion  11-13    Perceived Dyspnea  0-4      Progression   Progression  Continue to progress workloads to maintain intensity without signs/symptoms of physical distress.      Resistance Training   Training Prescription  Yes    Weight  5lbs    Reps  10-15       Perform Capillary Blood Glucose checks as needed.  Exercise Prescription Changes:   Exercise Comments:  Exercise Comments  [place] Name 07/22/19 0940           Exercise Comments  Reviewed home exercise plan with patient.          Exercise Goals and  Review:  Exercise Goals    Row Name 07/22/19 0945             Exercise Goals   Increase Physical Activity  Yes       Intervention  Provide advice, education, support and counseling about physical activity/exercise needs.;Develop an individualized exercise prescription for aerobic and resistive training based on initial evaluation findings, risk stratification, comorbidities and participant's personal goals.       Expected Outcomes  Short Term: Attend rehab on a regular basis to increase amount of physical activity.;Long Term: Exercising regularly at least 3-5 days a week.;Long Term: Add in home exercise to make exercise part of routine and to increase amount of physical activity.       Increase Strength and Stamina  Yes       Intervention  Provide advice, education, support and counseling about physical activity/exercise needs.;Develop an individualized exercise prescription for aerobic and resistive training based on initial evaluation findings, risk stratification, comorbidities and participant's personal goals.       Expected Outcomes  Short Term: Increase workloads from initial exercise prescription for resistance, speed, and METs.;Short Term: Perform resistance training exercises routinely during rehab and add in resistance training at home;Long Term: Improve cardiorespiratory fitness, muscular endurance and strength as measured by increased METs and functional capacity (6MWT)       Able to understand and use rate of perceived exertion (RPE) scale  Yes       Intervention  Provide education and explanation on how to use RPE scale       Expected Outcomes  Short Term: Able to use RPE daily in rehab to express subjective intensity level;Long Term:  Able to use RPE to guide intensity level when exercising independently       Knowledge and understanding of Target Heart Rate Range (THRR)  Yes       Intervention  Provide education and explanation of THRR including how the numbers were predicted and  where they are located for reference       Expected Outcomes  Short Term: Able to state/look up THRR;Long Term: Able to use THRR to govern intensity when exercising independently;Short Term: Able to use daily as guideline for intensity in rehab       Able to check pulse independently  Yes       Intervention  Provide education and demonstration on how to check pulse in carotid and radial arteries.;Review the importance of being able to check your own pulse for safety during independent exercise       Expected Outcomes  Short Term: Able to explain why pulse checking is important during independent exercise;Long Term: Able to check pulse independently and accurately       Understanding of Exercise Prescription  Yes       Intervention  Provide education, explanation, and written materials on patient's individual exercise prescription       Expected Outcomes  Short Term: Able to explain program exercise prescription;Long Term: Able to explain home exercise prescription to exercise independently          Exercise Goals Re-Evaluation :   Discharge Exercise Prescription (Final Exercise Prescription Changes):   Nutrition:  Target Goals: Understanding of nutrition guidelines, daily intake of sodium 1500mg , cholesterol 200mg , calories 30% from fat and  7% or less from saturated fats, daily to have 5 or more servings of fruits and vegetables.  Biometrics: Pre Biometrics - 07/22/19 0945      Pre Biometrics   Waist Circumference  32.5 inches    Hip Circumference  37 inches    Waist to Hip Ratio  0.88 %    Triceps Skinfold  26 mm    % Body Fat  34.9 %    Grip Strength  24 kg    Flexibility  15 in    Single Leg Stand  30 seconds        Nutrition Therapy Plan and Nutrition Goals: Nutrition Therapy & Goals - 07/29/19 1434      Nutrition Therapy   Diet  Heart Healthy    Drug/Food Interactions  Statins/Certain Fruits      Personal Nutrition Goals   Nutrition Goal  Pt to build a healthy plate  including vegetables, fruits, whole grains, and low-fat dairy products in a heart healthy meal plan.      Intervention Plan   Intervention  Prescribe, educate and counsel regarding individualized specific dietary modifications aiming towards targeted core components such as weight, hypertension, lipid management, diabetes, heart failure and other comorbidities.;Nutrition handout(s) given to patient.    Expected Outcomes  Short Term Goal: A plan has been developed with personal nutrition goals set during dietitian appointment.;Long Term Goal: Adherence to prescribed nutrition plan.       Nutrition Assessments: Nutrition Assessments - 07/29/19 1434      MEDFICTS Scores   Pre Score  42       Nutrition Goals Re-Evaluation:   Nutrition Goals Re-Evaluation:   Nutrition Goals Discharge (Final Nutrition Goals Re-Evaluation):   Psychosocial: Target Goals: Acknowledge presence or absence of significant depression and/or stress, maximize coping skills, provide positive support system. Participant is able to verbalize types and ability to use techniques and skills needed for reducing stress and depression.  Initial Review & Psychosocial Screening: Initial Psych Review & Screening - 07/22/19 1321      Initial Review   Current issues with  None Identified      Family Dynamics   Good Support System?  Yes    Comments  Ms. Cue has a positive outlook and attitude. No only did she recently have an MI, but she is a lung cancer surviver. She is a widow since 2008 but has a new partner of 3 years. He is her primary support however she has 3 daughters, one in [place] and 2 in [place]. 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 She states "I feel blessed to be alive". No interventions needed at this time. Patient is encouraged to exercise and stay active.      Barriers   Psychosocial barriers to participate in program  There are no  identifiable barriers or psychosocial needs.       Quality of Life Scores: Quality of Life - 07/22/19 1403      Quality of Life   Select  Quality of Life      Quality of Life Scores   Health/Function Pre  17.1 %    Socioeconomic Pre  23.57 %    Psych/Spiritual Pre  20.57 %    Family Pre  25.3 %    GLOBAL Pre  20.35 %      Scores of 19 and below usually indicate a poorer quality of life in these areas.  A  difference of  2-3 points is a clinically meaningful difference.  A difference of 2-3 points in the total score of the Quality of Life Index has been associated with significant improvement in overall quality of life, self-image, physical symptoms, and general health in studies assessing change in quality of life.  PHQ-9: Recent Review Flowsheet Data    Depression screen [facility] 2/9 07/22/2019 06/24/2018 04/25/2015   Decreased Interest 0 0 0   Down, Depressed, Hopeless 0 0 0   PHQ - 2 Score 0 0 0     Interpretation of Total Score  Total Score Depression Severity:  1-4 = Minimal depression, 5-9 = Mild depression, 10-14 = Moderate depression, 15-19 = Moderately severe depression, 20-27 = Severe depression   Psychosocial Evaluation and Intervention:   Psychosocial Re-Evaluation:   Psychosocial Discharge (Final Psychosocial Re-Evaluation):   Vocational Rehabilitation: Provide vocational rehab assistance to qualifying candidates.   Vocational Rehab Evaluation & Intervention:   Education: Education Goals: Education classes will be provided on a weekly basis, covering required topics. Participant will state understanding/return demonstration of topics presented.  Learning Barriers/Preferences:   Education Topics: Count Your Pulse:  -Group instruction provided by verbal instruction, demonstration, patient participation and written materials to support subject.  Instructors address importance of being able to find your pulse and how to count your pulse when at home without a  heart monitor.  Patients get hands on experience counting their pulse with staff help and individually.   Heart Attack, Angina, and Risk Factor Modification:  -Group instruction provided by verbal instruction, video, and written materials to support subject.  Instructors address signs and symptoms of angina and heart attacks.    Also discuss risk factors for heart disease and how to make changes to improve heart health risk factors.   Functional Fitness:  -Group instruction provided by verbal instruction, demonstration, patient participation, and written materials to support subject.  Instructors address safety measures for doing things around the house.  Discuss how to get up and down off the floor, how to pick things up properly, how to safely get out of a chair without assistance, and balance training.   Meditation and Mindfulness:  -Group instruction provided by verbal instruction, patient participation, and written materials to support subject.  Instructor addresses importance of mindfulness and meditation practice to help reduce stress and improve awareness.  Instructor also leads participants through a meditation exercise.    Stretching for Flexibility and Mobility:  -Group instruction provided by verbal instruction, patient participation, and written materials to support subject.  Instructors lead participants through series of stretches that are designed to increase flexibility thus improving mobility.  These stretches are additional exercise for major muscle groups that are typically performed during regular warm up and cool down.   Hands Only CPR:  -Group verbal, video, and participation provides a basic overview of AHA guidelines for community CPR. Role-play of emergencies allow participants the opportunity to practice calling for help and chest compression technique with discussion of AED use.   Hypertension: -Group verbal and written instruction that provides a basic overview of  hypertension including the most recent diagnostic guidelines, risk factor reduction with self-care instructions and medication management.    Nutrition I class: Heart Healthy Eating:  -Group instruction provided by PowerPoint slides, verbal discussion, and written materials to support subject matter. The instructor gives an explanation and review of the Therapeutic Lifestyle Changes diet recommendations, which includes a discussion on lipid goals, dietary fat, sodium, fiber, plant stanol/sterol esters, sugar,  and the components of a well-balanced, healthy diet.   Nutrition II class: Lifestyle Skills:  -Group instruction provided by PowerPoint slides, verbal discussion, and written materials to support subject matter. The instructor gives an explanation and review of label reading, grocery shopping for heart health, heart healthy recipe modifications, and ways to make healthier choices when eating out.   Diabetes Question & Answer:  -Group instruction provided by PowerPoint slides, verbal discussion, and written materials to support subject matter. The instructor gives an explanation and review of diabetes co-morbidities, pre- and post-prandial blood glucose goals, pre-exercise blood glucose goals, signs, symptoms, and treatment of hypoglycemia and hyperglycemia, and foot care basics.   Diabetes Blitz:  -Group instruction provided by PowerPoint slides, verbal discussion, and written materials to support subject matter. The instructor gives an explanation and review of the physiology behind type 1 and type 2 diabetes, diabetes medications and rational behind using different medications, pre- and post-prandial blood glucose recommendations and Hemoglobin A1c goals, diabetes diet, and exercise including blood glucose guidelines for exercising safely.    Portion Distortion:  -Group instruction provided by PowerPoint slides, verbal discussion, written materials, and food models to support subject  matter. The instructor gives an explanation of serving size versus portion size, changes in portions sizes over the last 20 years, and what consists of a serving from each food group.   Stress Management:  -Group instruction provided by verbal instruction, video, and written materials to support subject matter.  Instructors review role of stress in heart disease and how to cope with stress positively.     Exercising on Your Own:  -Group instruction provided by verbal instruction, power point, and written materials to support subject.  Instructors discuss benefits of exercise, components of exercise, frequency and intensity of exercise, and end points for exercise.  Also discuss use of nitroglycerin and activating EMS.  Review options of places to exercise outside of rehab.  Review guidelines for sex with heart disease.   Cardiac Drugs I:  -Group instruction provided by verbal instruction and written materials to support subject.  Instructor reviews cardiac drug classes: antiplatelets, anticoagulants, beta blockers, and statins.  Instructor discusses reasons, side effects, and lifestyle considerations for each drug class.   Cardiac Drugs II:  -Group instruction provided by verbal instruction and written materials to support subject.  Instructor reviews cardiac drug classes: angiotensin converting enzyme inhibitors (ACE-I), angiotensin II receptor blockers (ARBs), nitrates, and calcium channel blockers.  Instructor discusses reasons, side effects, and lifestyle considerations for each drug class.   Anatomy and Physiology of the Circulatory System:  Group verbal and written instruction and models provide basic cardiac anatomy and physiology, with the coronary electrical and arterial systems. Review of: AMI, Angina, Valve disease, Heart Failure, Peripheral Artery Disease, Cardiac Arrhythmia, Pacemakers, and the ICD.   Other Education:  -Group or individual verbal, written, or video instructions  that support the educational goals of the cardiac rehab program.   Holiday Eating Survival Tips:  -Group instruction provided by PowerPoint slides, verbal discussion, and written materials to support subject matter. The instructor gives patients tips, tricks, and techniques to help them not only survive but enjoy the holidays despite the onslaught of food that accompanies the holidays.   Knowledge Questionnaire Score: Knowledge Questionnaire Score - 07/22/19 1404      Knowledge Questionnaire Score   Pre Score  21/24       Core Components/Risk Factors/Patient Goals at Admission: Personal Goals and Risk Factors at Admission - 07/22/19 1404  Core Components/Risk Factors/Patient Goals on Admission    Weight Management  Weight Gain;Yes    Intervention  Weight Management: Develop a combined nutrition and exercise program designed to reach desired caloric intake, while maintaining appropriate intake of nutrient and fiber, sodium and fats, and appropriate energy expenditure required for the weight goal.;Weight Management: Provide education and appropriate resources to help participant work on and attain dietary goals.    Admit Weight  133 lb 2.5 oz (60.4 kg)    Expected Outcomes  Short Term: Continue to assess and modify interventions until short term weight is achieved;Long Term: Adherence to nutrition and physical activity/exercise program aimed toward attainment of established weight goal;Weight Maintenance: Understanding of the daily nutrition guidelines, which includes 25-35% calories from fat, 7% or less cal from saturated fats, less than 200mg  cholesterol, less than 1.5gm of sodium, & 5 or more servings of fruits and vegetables daily;Understanding recommendations for meals to include 15-35% energy as protein, 25-35% energy from fat, 35-60% energy from carbohydrates, less than 200mg  of dietary cholesterol, 20-35 gm of total fiber daily;Understanding of distribution of calorie intake  throughout the day with the consumption of 4-5 meals/snacks;Weight Gain: Understanding of general recommendations for a high calorie, high protein meal plan that promotes weight gain by distributing calorie intake throughout the day with the consumption for 4-5 meals, snacks, and/or supplements    Hypertension  Yes    Intervention  Provide education on lifestyle modifcations including regular physical activity/exercise, weight management, moderate sodium restriction and increased consumption of fresh fruit, vegetables, and low fat dairy, alcohol moderation, and smoking cessation.;Monitor prescription use compliance.    Expected Outcomes  Short Term: Continued assessment and intervention until BP is < 140/63mm HG in hypertensive participants. < 130/34mm HG in hypertensive participants with diabetes, heart failure or chronic kidney disease.;Long Term: Maintenance of blood pressure at goal levels.    Lipids  Yes    Intervention  Provide education and support for participant on nutrition & aerobic/resistive exercise along with prescribed medications to achieve LDL 70mg , HDL >40mg .    Expected Outcomes  Short Term: Participant states understanding of desired cholesterol values and is compliant with medications prescribed. Participant is following exercise prescription and nutrition guidelines.;Long Term: Cholesterol controlled with medications as prescribed, with individualized exercise RX and with personalized nutrition plan. Value goals: LDL < 70mg , HDL > 40 mg.    Stress  Yes    Expected Outcomes  Short Term: Participant demonstrates changes in health-related behavior, relaxation and other stress management skills, ability to obtain effective social support, and compliance with psychotropic medications if prescribed.;Long Term: Emotional wellbeing is indicated by absence of clinically significant psychosocial distress or social isolation.       Core Components/Risk Factors/Patient Goals Review:    Core  Components/Risk Factors/Patient Goals at Discharge (Final Review):    ITP Comments: ITP Comments    Row Name 07/22/19 0945           ITP Comments  Dr. Fransico Him, Medical Director [facility] Cardiac Rehab          Comments: Patient attended orientation on 07/30/2019 to review rules and guidelines for program.  Completed 6 minute walk test, Intitial ITP, and exercise prescription.  VSS. Telemetry-NSR.  Asymptomatic. Safety measures and social distancing in place per CDC guidelines. Patient previously downloaded [employer] virtual CR app. Today she was provided brief tutorial on use.   Spoke to pt regarding Virtual Cardiac  and Pulmonary Rehab.  Pt  was able to download  the [employer] app on their smart device with no issues. Pt set up their account and received the following welcome message -"Welcome to the [place] and Pulmonary Rehabilitation program. We hope that you will find the exercise program beneficial in your recovery process. Our staff is available to assist with any questions/concerns about your exercise routine. Best wishes". Brief orientation provided to with the advisement to watch the "Intro to Rehab" series located under the Resource tab. Pt verbalized understanding. Will continue to follow and monitor pt progress with feedback as needed.

## 2019-07-29 ENCOUNTER — Other Ambulatory Visit: Payer: Self-pay

## 2019-07-29 ENCOUNTER — Encounter (HOSPITAL_COMMUNITY)
Admission: RE | Admit: 2019-07-29 | Discharge: 2019-07-29 | Disposition: A | Discharge status: Home / Self Care | Payer: Medicare HMO | Source: Ambulatory Visit | Attending: Cardiology | Admitting: Cardiology

## 2019-07-29 ENCOUNTER — Telehealth (HOSPITAL_COMMUNITY): Payer: Self-pay

## 2019-07-29 NOTE — Telephone Encounter (Signed)
Pt insurance is active and benefits verified through [facility] Co-pay $45, DED 0/0 met, out of pocket $7,000/$234 met, co-insurance 0%. no pre-authorization required. Passport, 07/29/2019'@10' :59am, REF# [id number]  Will contact patient to see if he is interested in the Cardiac Rehab Program. If interested, patient will need to complete follow up appt. Once completed, patient will be contacted for scheduling upon review by the RN Navigator.

## 2019-07-29 NOTE — Progress Notes (Signed)
Allison [name] 69 y.o. female Nutrition Note  Visit Diagnosis: NSTEMI (non-ST elevated myocardial infarction) [facility])  Status post coronary artery stent placement  Past Medical History:  Diagnosis Date  . Anxiety   . Arthritis   . Asthma    exercise induced  . Depression    PMH  . GERD (gastroesophageal reflux disease)   . Glaucoma   . Hypertension   . lung ca dx'd 11/2017   right  . Malignant pleural effusion    right  . PONV (postoperative nausea and vomiting)   . Pre-diabetes   . Raynaud's disease   . Raynaud's disease      Medications reviewed.   Current Outpatient Medications:  .  acetaminophen (TYLENOL) 500 MG tablet, Take 1,000 mg by mouth every 6 (six) hours as needed (pain). , Disp: , Rfl:  .  ALPRAZolam (XANAX) 0.25 MG tablet, Take 0.25 mg by mouth 2 (two) times daily as needed for anxiety. , Disp: , Rfl:  .  amLODipine (NORVASC) 5 MG tablet, Take 5 mg by mouth daily., Disp: , Rfl: 3 .  aspirin 81 MG chewable tablet, Chew 1 tablet (81 mg total) by mouth daily., Disp:  , Rfl:  .  atorvastatin (LIPITOR) 80 MG tablet, Take 1 tablet (80 mg total) by mouth daily at 6 PM., Disp: 90 tablet, Rfl: 3 .  Biotin 1000 MCG CHEW, Chew 10 mg by mouth daily., Disp: , Rfl:  .  carvedilol (COREG) 3.125 MG tablet, Take 1 tablet (3.125 mg total) by mouth 2 (two) times daily with a meal., Disp: 180 tablet, Rfl: 1 .  clopidogrel (PLAVIX) 75 MG tablet, Take 1 tablet (75 mg total) by mouth daily with breakfast., Disp: 90 tablet, Rfl: 3 .  dorzolamide-timolol (COSOPT) 22.3-6.8 MG/ML ophthalmic solution, Instill 1 drop in both eyes twice daily, Disp: , Rfl:  .  ezetimibe (ZETIA) 10 MG tablet, Take 10 mg by mouth daily. , Disp: , Rfl:  .  latanoprost (XALATAN) 0.005 % ophthalmic solution, Place 1 drop into both eyes at bedtime. , Disp: , Rfl:  .  Melatonin 5 MG CAPS, Take 10 mg by mouth at bedtime as needed (sleep)., Disp: , Rfl:  .  nitroGLYCERIN (NITROSTAT) 0.4 MG SL tablet, Place 1 tablet  (0.4 mg total) under the tongue every 5 (five) minutes x 3 doses as needed for chest pain., Disp: 30 tablet, Rfl: 12 .  osimertinib mesylate (TAGRISSO) 80 MG tablet, Take 1 tablet (80 mg total) by mouth daily., Disp: 30 tablet, Rfl: 3 .  VITAMIN D, CHOLECALCIFEROL, PO, Take 5,000 Units by mouth daily. , Disp: , Rfl:    Ht Readings from Last 1 Encounters:  07/22/19 5' 4.75" (1.645 m)     Wt Readings from Last 3 Encounters:  07/22/19 133 lb 2.5 oz (60.4 kg)  07/16/19 129 lb (58.5 kg)  07/03/19 132 lb 9.6 oz (60.1 kg)     There is no height or weight on file to calculate BMI.   Social History   Tobacco Use  Smoking Status Never Smoker  Smokeless Tobacco Never Used     Lab Results  Component Value Date   CHOL 213 (H) 06/13/2019   Lab Results  Component Value Date   HDL 52 06/13/2019   Lab Results  Component Value Date   LDLCALC 148 (H) 06/13/2019   Lab Results  Component Value Date   TRIG 63 06/13/2019   Lab Results  Component Value Date   CHOLHDL 4.1 06/13/2019  Lab Results  Component Value Date   HGBA1C 5.5 06/13/2019     CBG (last 3)  No results for input(s): GLUCAP in the last 72 hours.   Nutrition Note  Spoke with pt. Nutrition Plan and Nutrition Survey goals reviewed with pt. Pt is trying to follow a Heart Healthy diet.  We reviewed heart healthy diet, label reading, and provided practical application for her individualized diet. She is taking Tagrisso for hx of stage IV lung cancer. She reports poor appetite but is able to maintain her weight. We discussed healthy fat sources and foods that she typically desires even with poor appetite. She is happy with include more nuts, peanut butter, apples, wheat crackers and bread, and whole grains. She feels these changes are doable and won't provide stress on her either.  Pt expressed understanding of the information reviewed.    Nutrition Diagnosis ? Food-and nutrition-related knowledge deficit related to  lack of exposure to information as related to diagnosis of: ? CVD ?   Nutrition Intervention ? Pt's individual nutrition plan reviewed with pt. ? Benefits of adopting Heart Healthy diet discussed when Med[employer]s reviewed.   ? Pt given handouts for: ? Nutrition I class ? Nutrition II class ?  ? Continue client-centered nutrition education by RD, as part of interdisciplinary care.  Goal(s) ? Pt to build a healthy plate including vegetables, fruits, whole grains, and low-fat dairy products in a heart healthy meal plan.  Plan:   Will provide client-centered nutrition education as part of interdisciplinary care  Monitor and evaluate progress toward nutrition goal with team.   Michaele Offer, MS, RDN, LDN

## 2019-07-30 ENCOUNTER — Telehealth (HOSPITAL_COMMUNITY): Payer: Self-pay

## 2019-07-30 NOTE — Telephone Encounter (Signed)
Phoned pt regarding Cardiac Rehab reopening for in person exercise. Pt declined due to high copayment. [facility] is [facility] [employer]. Pt will continue to participate in virtual Cardiac Rehab. Will continue to monitor pt with [employer] App.   Carma Lair MS, ACSM CEP 9:43 AM 07/30/2019

## 2019-08-01 ENCOUNTER — Ambulatory Visit (HOSPITAL_COMMUNITY)
Admission: RE | Admit: 2019-08-01 | Discharge: 2019-08-01 | Disposition: A | Discharge status: Home / Self Care | Payer: Medicare HMO | Source: Ambulatory Visit | Attending: Cardiology | Admitting: Cardiology

## 2019-08-01 ENCOUNTER — Other Ambulatory Visit: Payer: Self-pay

## 2019-08-01 DIAGNOSIS — R0989 Other specified symptoms and signs involving the circulatory and respiratory systems: Secondary | ICD-10-CM | POA: Diagnosis not present

## 2019-08-04 ENCOUNTER — Encounter (HOSPITAL_COMMUNITY)
Admission: RE | Admit: 2019-08-04 | Discharge: 2019-08-04 | Disposition: A | Discharge status: Home / Self Care | Payer: Medicare HMO | Source: Ambulatory Visit | Attending: Cardiology | Admitting: Cardiology

## 2019-08-04 DIAGNOSIS — I214 Non-ST elevation (NSTEMI) myocardial infarction: Secondary | ICD-10-CM

## 2019-08-04 DIAGNOSIS — Z955 Presence of coronary angioplasty implant and graft: Secondary | ICD-10-CM

## 2019-08-04 NOTE — Progress Notes (Signed)
Cardiac Rehab: Virtual Visit  Called patient for weekly follow-up. Patient is participating in the virtual cardiac rehab program via the [employer] app. Patient is exercising consistently 30-40 minutes, 4 days/week. Patient participates in the [employer] program and took a Scientist, physiological class for the first time this past Saturday. The class involved aerobic workout, stretching, exercise bands and weight exercises. Patient also participates in the [employer] [facility] including a Qijong class, which she participates in on Mondays.  Patient had questions about her target heart rate range and how to monitor during exercise. Patient has an Apple watch but doesn't know how to set the heart rate range on the device. I discussed with our nurse case manager Portia, who has a similar device who will contact patient to assist with setup. I discussed with patient safe parameters for HR before, during, and after exercise, and patient verbalizes understanding. Patient wanted to know the results of her education test taken at orientation and her measurements and walk test. I reviewed these results with her, and Trish Fountain, the nurse case manager for the virtual program will follow-up with patient regarding her quality of life survey. Patient was very appreciative of the call. Will follow-up next week.  Allison Passer, MS, ACSM [name] 08/04/[phone]

## 2019-08-06 ENCOUNTER — Telehealth (HOSPITAL_COMMUNITY): Payer: Self-pay

## 2019-08-06 NOTE — Telephone Encounter (Signed)
Virtual Cardiac Rehab Note:  Successful telephone encounter to Allison [name] to follow up on her request to receive assistance with apple watch fitness and QOL review. Allison [name] is grateful for call but unable to talk at this time. She request call back later today.  Plan: Will follow up later today per patient request  Allison [name] E. Rollene Rotunda RN, BSN [place]. [facility]  Cardiac and Pulmonary Rehabilitation [place] Direct: [phone]
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
## 2019-08-08 ENCOUNTER — Encounter (HOSPITAL_COMMUNITY)
Admission: RE | Admit: 2019-08-08 | Discharge: 2019-08-08 | Disposition: A | Discharge status: Home / Self Care | Payer: Medicare HMO | Source: Ambulatory Visit | Attending: Diagnostic Radiology | Admitting: Diagnostic Radiology

## 2019-08-08 ENCOUNTER — Other Ambulatory Visit: Payer: Self-pay

## 2019-08-08 DIAGNOSIS — Z955 Presence of coronary angioplasty implant and graft: Secondary | ICD-10-CM

## 2019-08-08 DIAGNOSIS — I214 Non-ST elevation (NSTEMI) myocardial infarction: Secondary | ICD-10-CM
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
## 2019-08-13 ENCOUNTER — Telehealth: Payer: Self-pay | Admitting: Physician Assistant

## 2019-08-13 ENCOUNTER — Encounter (HOSPITAL_COMMUNITY)
Admission: RE | Admit: 2019-08-13 | Discharge: 2019-08-13 | Disposition: A | Discharge status: Home / Self Care | Payer: Medicare HMO | Source: Ambulatory Visit | Attending: Cardiology | Admitting: Cardiology

## 2019-08-13 DIAGNOSIS — Z955 Presence of coronary angioplasty implant and graft: Secondary | ICD-10-CM

## 2019-08-13 DIAGNOSIS — I214 Non-ST elevation (NSTEMI) myocardial infarction: Secondary | ICD-10-CM

## 2019-08-13 MED ORDER — ATORVASTATIN CALCIUM 80 MG PO TABS: 40.0000 mg | ORAL_TABLET | Freq: Every day | ORAL | Status: DC

## 2019-08-13 NOTE — Progress Notes (Signed)
Cardiac Rehab: Virtual Visit  Patient participates in the virtual cardiac rehab program via [employer] app. I called patient today regarding logged heart rate of 167 bpm with exercise. Patient's states that she did more today with weight training, and she thinks that may be when her heart rate was high. Patient is using her Apple watch to monitor her heart rate, so she wasn't aware of the HR until after her exercise session. She did think that an upper limit was set at 122 bpm to alert her if her HR was above her THRR, but she didn't receive an alert during exercise. I discussed with patient that it would be a good idea to learn how to manually check her pulse as well to verify the accuracy of the HR given on her watch, and patient is amenable to this. Patient will send me a message to schedule a virtual session to review pulse counting. Otherwise patient had a very positive exercise session including a short run and weight training.   Sol Passer, MS, ACSM CEP

## 2019-08-13 NOTE — Telephone Encounter (Signed)
I spoke with Mrs. Allison [name], I suspect the recent lower extremity swelling is related to venous stasis, I first recommended conservative management using leg elevation and if needed compression stocking.  If this fails, may need to consider low-dose diuretic or scale back on the amlodipine.  On further discussion, she has cut back Lipitor to 40 mg daily due to muscle ache back in January.  She is due for cholesterol lab work tomorrow.  She has follow-up with Dr. Carlena Hurl next month. I have updated the med list.

## 2019-08-14 ENCOUNTER — Other Ambulatory Visit: Payer: Self-pay

## 2019-08-14 ENCOUNTER — Telehealth (HOSPITAL_COMMUNITY): Payer: Self-pay

## 2019-08-14 ENCOUNTER — Telehealth: Payer: Self-pay | Admitting: Medical Oncology

## 2019-08-14 DIAGNOSIS — E785 Hyperlipidemia, unspecified: Secondary | ICD-10-CM

## 2019-08-14 NOTE — Telephone Encounter (Signed)
Confirmed appt for next week.

## 2019-08-14 NOTE — Telephone Encounter (Signed)
Virtual Cardiac Rehab Note:  Successful telephone encounter to Allison [name] to discuss counseling resources. Unfortunately Ms. Joens does not have time to discuss at his moment. Patient will call back tomorrow 08/15/19 to discuss.  Donyelle Enyeart E. Rollene Rotunda RN, BSN [place]. [facility]  Cardiac and Pulmonary Rehabilitation [place] Direct: [phone]

## 2019-08-15 ENCOUNTER — Other Ambulatory Visit: Payer: Self-pay

## 2019-08-15 ENCOUNTER — Inpatient Hospital Stay (HOSPITAL_COMMUNITY)
Admission: RE | Admit: 2019-08-15 | Discharge: 2019-08-15 | Disposition: A | Discharge status: Home / Self Care | Payer: Medicare HMO | Source: Ambulatory Visit

## 2019-08-15 DIAGNOSIS — E785 Hyperlipidemia, unspecified: Secondary | ICD-10-CM | POA: Diagnosis not present

## 2019-08-15 LAB — HEPATIC FUNCTION PANEL
ALT: 91 IU/L — ABNORMAL HIGH (ref 0–32)
AST: 52 IU/L — ABNORMAL HIGH (ref 0–40)
Albumin: 4.4 g/dL (ref 3.8–4.8)
Alkaline Phosphatase: 81 IU/L (ref 39–117)
Bilirubin Total: 0.3 mg/dL (ref 0.0–1.2)
Bilirubin, Direct: 0.11 mg/dL (ref 0.00–0.40)
Total Protein: 6.8 g/dL (ref 6.0–8.5)

## 2019-08-15 LAB — LIPID PANEL
Chol/HDL Ratio: 1.8 ratio (ref 0.0–4.4)
Cholesterol, Total: 119 mg/dL (ref 100–199)
HDL: 66 mg/dL (ref >39)
LDL Chol Calc (NIH): 44 mg/dL (ref 0–99)
Triglycerides: 31 mg/dL (ref 0–149)
VLDL Cholesterol Cal: 9 mg/dL (ref 5–40)

## 2019-08-15 NOTE — Progress Notes (Signed)
Virtual Cardiac Rehab Note:  Successful telephone call to Ms. Allison [name] to provide her with counseling resources for couples. She has a loving relationship with her significant other of 3 years however since her illness intimacy has been lost. She is provided contact information for [facility] counseling at [email or web address].  Ms. Ratliff also request instructions on pulse counting. She is provided very detailed verbal instructions on how to find the radial and carotid pulse. She is able to locate her own pulse and states she is able to accurately count her radial pulse for 15 seconds and multiply by 4. She states she feels empowered by this information and will utilize during her daily exercise to make sure she does not exceed her maximum target HR of 122.  Plan: Will follow up with patient daily via [employer] app and PRN  Mylisa Brunson E. Rollene Rotunda RN, BSN [place]. [facility]  Cardiac and Pulmonary Rehabilitation [place] Direct: [phone]

## 2019-08-15 NOTE — Progress Notes (Signed)
Cardiac Rehab: Virtual Visit Nutrition Note:  Spoke with Circe over the phone. We discussed lack of appetite likely due to medication (oncology). She reports more hunger after exercise. She typically exercises in the morning. Recommended some type of exercise or physical activity prior to largest meal (typically dinner at 3 pm). She feels this may help and is willing to try. She denies any N/V or abdominal pain with eating. She simply is disinterested. She is able to to eat but feels the enjoyment factor is gone. She reports occasionally feeling very hungry and food being interesting. Encouraged pt to choose those times to indulge in her favorite foods (for QOL).  Recommended trying lemon or lime juice and trying food at room temp.  We discussed drink options other than water as well.  Pt verbalized understanding. Will follow up in 2 weeks to monitor progress.  Michaele Offer, MS, RDN, LDN

## 2019-08-15 NOTE — Progress Notes (Signed)
Cholesterol well controlled, liver enzyme is elevated on the current dose of lipitor. Recommend hold lipitor for 1 week, then restart at 20mg  daily. Recheck liver enzyme and FLP in a month

## 2019-08-18 ENCOUNTER — Other Ambulatory Visit: Payer: Self-pay

## 2019-08-18 DIAGNOSIS — Z79899 Other long term (current) drug therapy: Secondary | ICD-10-CM

## 2019-08-18 DIAGNOSIS — E785 Hyperlipidemia, unspecified: Secondary | ICD-10-CM

## 2019-08-18 MED ORDER — ATORVASTATIN CALCIUM 20 MG PO TABS: 20.0000 mg | ORAL_TABLET | Freq: Every day | ORAL | 3 refills | Status: DC

## 2019-08-19 ENCOUNTER — Inpatient Hospital Stay: Payer: Medicare HMO

## 2019-08-19 ENCOUNTER — Encounter: Payer: Self-pay | Admitting: Internal Medicine

## 2019-08-19 ENCOUNTER — Inpatient Hospital Stay: Payer: Medicare HMO | Attending: Internal Medicine | Admitting: Internal Medicine

## 2019-08-19 ENCOUNTER — Other Ambulatory Visit: Payer: Self-pay

## 2019-08-19 VITALS — BP 159/73 | HR 62 | Temp 98.1°F | Resp 20 | Ht 64.75 in | Wt 133.0 lb

## 2019-08-19 DIAGNOSIS — Z79899 Other long term (current) drug therapy: Secondary | ICD-10-CM | POA: Insufficient documentation

## 2019-08-19 DIAGNOSIS — C3411 Malignant neoplasm of upper lobe, right bronchus or lung: Secondary | ICD-10-CM | POA: Insufficient documentation

## 2019-08-19 DIAGNOSIS — C3491 Malignant neoplasm of unspecified part of right bronchus or lung: Secondary | ICD-10-CM

## 2019-08-19 DIAGNOSIS — C3490 Malignant neoplasm of unspecified part of unspecified bronchus or lung: Secondary | ICD-10-CM

## 2019-08-19 DIAGNOSIS — I10 Essential (primary) hypertension: Secondary | ICD-10-CM | POA: Diagnosis not present

## 2019-08-19 DIAGNOSIS — Z5111 Encounter for antineoplastic chemotherapy: Secondary | ICD-10-CM

## 2019-08-19 LAB — CMP (CANCER CENTER ONLY)
ALT: 77 U/L — ABNORMAL HIGH (ref 0–44)
AST: 43 U/L — ABNORMAL HIGH (ref 15–41)
Albumin: 4.2 g/dL (ref 3.5–5.0)
Alkaline Phosphatase: 75 U/L (ref 38–126)
Anion gap: 8 (ref 5–15)
BUN: 10 mg/dL (ref 8–23)
CO2: 27 mmol/L (ref 22–32)
Calcium: 9.7 mg/dL (ref 8.9–10.3)
Chloride: 108 mmol/L (ref 98–111)
Creatinine: 0.9 mg/dL (ref 0.44–1.00)
GFR, Est AFR Am: >60 mL/min (ref >60)
GFR, Estimated: >60 mL/min (ref >60)
Glucose, Bld: 95 mg/dL (ref 70–99)
Potassium: 4.8 mmol/L (ref 3.5–5.1)
Sodium: 143 mmol/L (ref 135–145)
Total Bilirubin: 0.4 mg/dL (ref 0.3–1.2)
Total Protein: 7.9 g/dL (ref 6.5–8.1)

## 2019-08-19 LAB — CBC WITH DIFFERENTIAL (CANCER CENTER ONLY)
Abs Immature Granulocytes: 0 10*3/uL (ref 0.00–0.07)
Basophils Absolute: 0 10*3/uL (ref 0.0–0.1)
Basophils Relative: 1 %
Eosinophils Absolute: 0.2 10*3/uL (ref 0.0–0.5)
Eosinophils Relative: 5 %
HCT: 38.8 % (ref 36.0–46.0)
Hemoglobin: 12.1 g/dL (ref 12.0–15.0)
Immature Granulocytes: 0 %
Lymphocytes Relative: 37 %
Lymphs Abs: 1.7 10*3/uL (ref 0.7–4.0)
MCH: 26.1 pg (ref 26.0–34.0)
MCHC: 31.2 g/dL (ref 30.0–36.0)
MCV: 83.8 fL (ref 80.0–100.0)
Monocytes Absolute: 0.4 10*3/uL (ref 0.1–1.0)
Monocytes Relative: 9 %
Neutro Abs: 2.2 10*3/uL (ref 1.7–7.7)
Neutrophils Relative %: 48 %
Platelet Count: 205 10*3/uL (ref 150–400)
RBC: 4.63 MIL/uL (ref 3.87–5.11)
RDW: 14.6 % (ref 11.5–15.5)
WBC Count: 4.6 10*3/uL (ref 4.0–10.5)
nRBC: 0 % (ref 0.0–0.2)

## 2019-08-19 NOTE — Progress Notes (Signed)
[facility] [phone]   [phone]  OFFICE PROGRESS NOTE  [name], [place] [address]  DIAGNOSIS: Stage IV (T2 a,N2, M1a) non-small cell lung cancer, adenocarcinoma diagnosed in July 2019 and presented with right upper lobe lung mass in addition to mediastinal lymphadenopathy as well as bilateral pulmonary nodules and malignant right pleural effusion.  Biomarker Findings Microsatellite status - Cannot Be Determined Tumor Mutational Burden - Cannot Be Determined Genomic Findings For a complete list of the genes assayed, please refer to the Appendix. EGFR exon 19 deletion ([id number]) TP53 Y220C 7 Disease relevant genes with no reportable alterations: KRAS, ALK, BRAF, MET, RET, ERBB2, ROS1   PRIOR THERAPY: Status post right Pleurx catheter placement by Dr. Prescott Gum for drainage of malignant right pleural effusion.  CURRENT THERAPY: Tagrisso 80 mg p.o. daily.  First dose was given on 01/29/2018.  Status post 18 months of treatment.  INTERVAL HISTORY: Allison [name] 69 y.o. female returns to the clinic today for follow-up visit.  The patient is feeling fine today with no concerning complaints except for fatigue.  She was recently on treatment with Lipitor 40 mg p.o. daily but her liver enzymes were elevated and she was advised to hold her medication for now.  She denied having any current chest pain, shortness of breath, cough or hemoptysis.  She denied having any fever or chills.  She has no nausea, vomiting, diarrhea or constipation.  She has no headache or visual changes.  She continues to tolerate her treatment with Tagrisso fairly well.  She mentions that her blood pressure is usually normal at home but it is usually elevated when she comes to the cancer center.  She is here today for evaluation and repeat blood work.   MEDICAL HISTORY: Past Medical History:  Diagnosis Date  . Anxiety   .  Arthritis   . Asthma    exercise induced  . Depression    PMH  . GERD (gastroesophageal reflux disease)   . Glaucoma   . Hypertension   . lung ca dx'd 11/2017   right  . Malignant pleural effusion    right  . PONV (postoperative nausea and vomiting)   . Pre-diabetes   . Raynaud's disease   . Raynaud's disease     ALLERGIES:  is allergic to penicillins and vicodin [hydrocodone-acetaminophen].  MEDICATIONS:  Current Outpatient Medications  Medication Sig Dispense Refill  . acetaminophen (TYLENOL) 500 MG tablet Take 1,000 mg by mouth every 6 (six) hours as needed (pain).     Marland Kitchen ALPRAZolam (XANAX) 0.25 MG tablet Take 0.25 mg by mouth 2 (two) times daily as needed for anxiety.     Marland Kitchen amLODipine (NORVASC) 5 MG tablet Take 5 mg by mouth daily.  3  . aspirin 81 MG chewable tablet Chew 1 tablet (81 mg total) by mouth daily.    Marland Kitchen atorvastatin (LIPITOR) 20 MG tablet Take 1 tablet (20 mg total) by mouth daily at 6 PM. 30 tablet 3  . Biotin 1000 MCG CHEW Chew 10 mg by mouth daily.    . carvedilol (COREG) 3.125 MG tablet Take 1 tablet (3.125 mg total) by mouth 2 (two) times daily with a meal. 180 tablet 1  . clopidogrel (PLAVIX) 75 MG tablet Take 1 tablet (75 mg total) by mouth daily with breakfast. 90 tablet 3  . dorzolamide-timolol (COSOPT) 22.3-6.8 MG/ML ophthalmic solution Instill 1 drop in both eyes twice daily    .  ezetimibe (ZETIA) 10 MG tablet Take 10 mg by mouth daily.     Marland Kitchen latanoprost (XALATAN) 0.005 % ophthalmic solution Place 1 drop into both eyes at bedtime.     . Melatonin 5 MG CAPS Take 10 mg by mouth at bedtime as needed (sleep).    . nitroGLYCERIN (NITROSTAT) 0.4 MG SL tablet Place 1 tablet (0.4 mg total) under the tongue every 5 (five) minutes x 3 doses as needed for chest pain. 30 tablet 12  . osimertinib mesylate (TAGRISSO) 80 MG tablet Take 1 tablet (80 mg total) by mouth daily. 30 tablet 3  . VITAMIN D, CHOLECALCIFEROL, PO Take 5,000 Units by mouth daily.      No current  facility-administered medications for this visit.    SURGICAL HISTORY:  Past Surgical History:  Procedure Laterality Date  . ABDOMINAL HYSTERECTOMY     partial  . CHEST TUBE INSERTION Right 01/01/2018   Procedure: INSERTION PLEURAL DRAINAGE CATHETER;  Surgeon: Ivin Poot, MD;  Location: [facility];  Service: Thoracic;  Laterality: Right;  . COLONOSCOPY    . CORONARY STENT INTERVENTION N/A 06/16/2019   Procedure: CORONARY STENT INTERVENTION;  Surgeon: Jettie Booze, MD;  Location: [facility];  Service: Cardiovascular;  Laterality: N/A;  . DILATION AND CURETTAGE OF UTERUS    . EYE SURGERY     due to Glaucoma  . LEFT HEART CATH AND CORONARY ANGIOGRAPHY N/A 06/16/2019   Procedure: LEFT HEART CATH AND CORONARY ANGIOGRAPHY;  Surgeon: Jettie Booze, MD;  Location: [facility];  Service: Cardiovascular;  Laterality: N/A;  . REMOVAL OF PLEURAL DRAINAGE CATHETER Right 11/07/2018   Procedure: REMOVAL OF PLEURAL DRAINAGE CATHETER;  Surgeon: Ivin Poot, MD;  Location: [facility];  Service: Thoracic;  Laterality: Right;  . ROTATOR CUFF REPAIR    . TUBAL LIGATION    . WISDOM TOOTH EXTRACTION      REVIEW OF SYSTEMS:  A comprehensive review of systems was negative except for: Constitutional: positive for fatigue   PHYSICAL EXAMINATION: General appearance: alert, cooperative, distracted and no distress Head: Normocephalic, without obvious abnormality, atraumatic Neck: no adenopathy, no JVD, supple, symmetrical, trachea midline and thyroid not enlarged, symmetric, no tenderness/mass/nodules Lymph nodes: Cervical, supraclavicular, and axillary nodes normal. Resp: clear to auscultation bilaterally Back: symmetric, no curvature. ROM normal. No CVA tenderness. Cardio: regular rate and rhythm, S1, S2 normal, no murmur, click, rub or gallop GI: soft, non-tender; bowel sounds normal; no masses,  no organomegaly Extremities: extremities normal, atraumatic, no cyanosis or  edema  ECOG PERFORMANCE STATUS: 0 - Asymptomatic  Blood pressure (!) 159/73, pulse 62, temperature 98.1 F (36.7 C), temperature source Temporal, resp. rate 20, height 5' 4.75" (1.645 m), weight 133 lb (60.3 kg), SpO2 100 %.  LABORATORY DATA: Lab Results  Component Value Date   WBC 4.6 08/19/2019   HGB 12.1 08/19/2019   HCT 38.8 08/19/2019   MCV 83.8 08/19/2019   PLT 205 08/19/2019      Chemistry      Component Value Date/Time   NA 140 06/17/2019 0356   K 3.6 06/17/2019 0356   CL 107 06/17/2019 0356   CO2 24 06/17/2019 0356   BUN 17 06/17/2019 0356   CREATININE 1.02 (H) 06/17/2019 0356   CREATININE 0.92 05/02/2019 0814      Component Value Date/Time   CALCIUM 9.2 06/17/2019 0356   ALKPHOS 81 08/15/2019 0925   AST 52 (H) 08/15/2019 0925   AST 26 05/02/2019 0814  ALT 91 (H) 08/15/2019 0925   ALT 27 05/02/2019 0814   BILITOT 0.3 08/15/2019 0925   BILITOT 0.3 05/02/2019 0814       RADIOGRAPHIC STUDIES: VAS US CAROTID  Result Date: 08/01/2019 Carotid Arterial Duplex Study Indications:  Right bruit. Patient denies any cerebrovascular symptoms. Risk Factors: Hypertension, no history of smoking, prior MI. Performing Technologist: Mariane Masters RVT  Examination Guidelines: A complete evaluation includes B-mode imaging, spectral Doppler, color Doppler, and power Doppler as needed of all accessible portions of each vessel. Bilateral testing is considered an integral part of a complete examination. Limited examinations for reoccurring indications may be performed as noted.  Right Carotid Findings: +----------+--------+--------+--------+------------------+------------------+           PSV cm/sEDV cm/sStenosisPlaque DescriptionComments           +----------+--------+--------+--------+------------------+------------------+ CCA Prox  92      14                                                   +----------+--------+--------+--------+------------------+------------------+  CCA Distal66      18                                intimal thickening +----------+--------+--------+--------+------------------+------------------+ ICA Prox  74      15      Normal                                       +----------+--------+--------+--------+------------------+------------------+ ICA Mid   64      21                                                   +----------+--------+--------+--------+------------------+------------------+ ICA Distal83      29                                                   +----------+--------+--------+--------+------------------+------------------+ ECA       66      7                                                    +----------+--------+--------+--------+------------------+------------------+ +----------+--------+-------+----------------+-------------------+           PSV cm/sEDV cmsDescribe        Arm Pressure (mmHG) +----------+--------+-------+----------------+-------------------+ [id number]            Multiphasic, [id number]                 +----------+--------+-------+----------------+-------------------+ +---------+--------+--+--------+--+---------+ VertebralPSV cm/s49EDV cm/s14Antegrade +---------+--------+--+--------+--+---------+  Left Carotid Findings: +----------+--------+--------+--------+------------------+--------+           PSV cm/sEDV cm/sStenosisPlaque DescriptionComments +----------+--------+--------+--------+------------------+--------+ CCA Prox  68      15  tortuous +----------+--------+--------+--------+------------------+--------+ CCA Distal80      22      <50%    smooth                     +----------+--------+--------+--------+------------------+--------+ ICA Prox  68      17              heterogenous               +----------+--------+--------+--------+------------------+--------+ ICA Mid   101     29      1-39%                               +----------+--------+--------+--------+------------------+--------+ ICA Distal60      17                                tortuous +----------+--------+--------+--------+------------------+--------+ ECA       90      10                                         +----------+--------+--------+--------+------------------+--------+ +----------+--------+--------+----------------+-------------------+           PSV cm/sEDV cm/sDescribe        Arm Pressure (mmHG) +----------+--------+--------+----------------+-------------------+ Subclavian119             Multiphasic, [id number]                 +----------+--------+--------+----------------+-------------------+ +---------+--------+--+--------+--+---------+ VertebralPSV cm/s41EDV cm/s11Antegrade +---------+--------+--+--------+--+---------+   Summary: Right Carotid: There is no evidence of stenosis in the right ICA. The                extracranial vessels were near-normal with only minimal wall                thickening or plaque. Left Carotid: Velocities in the left ICA are consistent with a 1-39% stenosis.               Non-hemodynamically significant plaque <50% noted in the CCA. Vertebrals:  Bilateral vertebral arteries demonstrate antegrade flow. Subclavians: Normal flow hemodynamics were seen in bilateral subclavian              arteries. *See table(s) above for measurements and observations.  Electronically signed by Jenkins Rouge MD on 08/01/2019 at 11:47:17 AM.    Final     ASSESSMENT AND PLAN: This is a very pleasant 69 years old never smoker African-American female recently with a stage IV non-small cell lung cancer, adenocarcinoma with positive EGFR mutation with deletion in exon 19. The patient was started on treatment with Tagrisso 80 mg p.o. daily status post 18 months of treatment. The patient has been tolerating her treatment well with no concerning adverse effects. I recommended for her to continue her current treatment  with Tagrisso with the same dose. I will see her back for follow-up visit in 6 weeks for evaluation with repeat CT scan of the chest, abdomen pelvis for restaging of her disease. For the hypertension she will continue to monitor her blood pressure closely at home and report to her primary care physician if she needs adjustment of her medication. The patient was advised to call immediately if she has any other concerning symptoms in the interval. The patient voices understanding of current disease status and treatment options and  is in agreement with the current care plan. All questions were answered. The patient knows to call the clinic with any problems, questions or concerns. We can certainly see the patient much sooner if necessary.  Disclaimer: This note was dictated with voice recognition software. Similar sounding words can inadvertently be transcribed and may not be corrected upon review.

## 2019-08-20 ENCOUNTER — Ambulatory Visit: Payer: Medicare HMO | Admitting: Critical Care Medicine

## 2019-08-20 ENCOUNTER — Telehealth: Payer: Self-pay | Admitting: Internal Medicine

## 2019-08-20 VITALS — BP 127/66 | HR 79 | Temp 98.2°F | Resp 18 | Ht 64.0 in | Wt 132.0 lb

## 2019-08-20 DIAGNOSIS — J90 Pleural effusion, not elsewhere classified: Secondary | ICD-10-CM

## 2019-08-20 DIAGNOSIS — R945 Abnormal results of liver function studies: Secondary | ICD-10-CM | POA: Diagnosis not present

## 2019-08-20 DIAGNOSIS — H409 Unspecified glaucoma: Secondary | ICD-10-CM | POA: Insufficient documentation

## 2019-08-20 DIAGNOSIS — I252 Old myocardial infarction: Secondary | ICD-10-CM | POA: Diagnosis not present

## 2019-08-20 DIAGNOSIS — Z955 Presence of coronary angioplasty implant and graft: Secondary | ICD-10-CM | POA: Diagnosis not present

## 2019-08-20 DIAGNOSIS — I10 Essential (primary) hypertension: Secondary | ICD-10-CM

## 2019-08-20 DIAGNOSIS — C3491 Malignant neoplasm of unspecified part of right bronchus or lung: Secondary | ICD-10-CM | POA: Diagnosis not present

## 2019-08-20 DIAGNOSIS — E559 Vitamin D deficiency, unspecified: Secondary | ICD-10-CM | POA: Insufficient documentation

## 2019-08-20 DIAGNOSIS — E7800 Pure hypercholesterolemia, unspecified: Secondary | ICD-10-CM

## 2019-08-20 DIAGNOSIS — N951 Menopausal and female climacteric states: Secondary | ICD-10-CM | POA: Insufficient documentation

## 2019-08-20 DIAGNOSIS — I7300 Raynaud's syndrome without gangrene: Secondary | ICD-10-CM | POA: Insufficient documentation

## 2019-08-20 DIAGNOSIS — E785 Hyperlipidemia, unspecified: Secondary | ICD-10-CM | POA: Insufficient documentation

## 2019-08-20 DIAGNOSIS — K219 Gastro-esophageal reflux disease without esophagitis: Secondary | ICD-10-CM | POA: Insufficient documentation

## 2019-08-20 DIAGNOSIS — F411 Generalized anxiety disorder: Secondary | ICD-10-CM | POA: Insufficient documentation

## 2019-08-20 MED ORDER — ATORVASTATIN CALCIUM 20 MG PO TABS: ORAL_TABLET | ORAL | 3 refills | Status: DC

## 2019-08-20 NOTE — Telephone Encounter (Signed)
Scheduled per los. Called and spoke with patient. Confirmed appt 

## 2019-08-20 NOTE — Progress Notes (Signed)
Patient has eaten

## 2019-08-20 NOTE — Progress Notes (Signed)
Subjective:    Patient ID: Allison [name], female    DOB: [birth year], 69 y.o.   MRN: [id number]  This is a pleasant 69 year old female who has history of stage IV adenocarcinoma of the lung with pleural metastases undergoing now oral chemotherapy per Dr. [name] of [facility]  The patient comes into the mobile clinic today wanting to discuss her recent liver function tests in reference to the use of statin therapy  The patient had originally been on an 80 mg daily dose of atorvastatin since the new year after having had a non-STEMI with resultant cardiac catheterization and placement of a stent reducing 100% RCA obstruction to 0 in December 2020  The patient was placed on the statin therapy to treat the hypercholesterolemia.  Liver function tests on admission were not remarkable however over the ensuing 6 weeks her liver function has increased.  The patient denies any nausea vomiting muscle aches or other symptoms referable to her liver.  Her current chemotherapy upon review does not have side effects causing AST and ALT elevations in the liver.  In addition to above the patient has glaucoma, Raynaud's phenomena, vitamin D deficiency, and anxiety in the past.  The patient has no other specific complaints at this time.  She is exercising better and eating better and is following a healthier lifestyle since the diagnosis of her lung cancer    Past Medical History:  Diagnosis Date  . Anxiety   . Arthritis   . Asthma    exercise induced  . Depression    PMH  . GERD (gastroesophageal reflux disease)   . Glaucoma   . Hypertension   . lung ca dx'd 11/2017   right  . Malignant pleural effusion    right  . PONV (postoperative nausea and vomiting)   . Pre-diabetes   . Raynaud's disease   . Raynaud's disease      Family History  Problem Relation Age of Onset  . Heart disease Sister   . Heart disease Brother   . Lung cancer Other      Social History    Socioeconomic History  . Marital status: Widowed    Spouse name: Not on file  . Number of children: 3  . Years of education: Not on file  . Highest education level: Not on file  Occupational History    Employer: [employer]  Tobacco Use  . Smoking status: Never Smoker  . Smokeless tobacco: Never Used  Substance and Sexual Activity  . Alcohol use: Yes    Comment: up to 3 drinks per week  . Drug use: No    Comment: CBD oil   . Sexual activity: Not Currently    Comment: Hysterectomy  Other Topics Concern  . Not on file  Social History Narrative  . Not on file   Social Determinants of Health   Financial Resource Strain: Low Risk   . Difficulty of Paying Living Expenses: Not hard at all  Food Insecurity: No Food Insecurity  . Worried About Charity fundraiser in the Last Year: Never true  . Ran Out of Food in the Last Year: Never true  Transportation Needs: No Transportation Needs  . Lack of Transportation (Medical): No  . Lack of Transportation (Non-Medical): No  Physical Activity: Sufficiently Active  . Days of Exercise per Week: 5 days  . Minutes of Exercise per Session: 30 min  Stress:   . Feeling of Stress : Not on  file  Social Connections: Unknown  . Frequency of Communication with Friends and Family: Three times a week  . Frequency of Social Gatherings with Friends and Family: Once a week  . Attends Religious Services: Not on file  . Active Member of Clubs or Organizations: Yes  . Attends Archivist Meetings: More than 4 times per year  . Marital Status: Living with partner  Intimate Partner Violence:   . Fear of Current or Ex-Partner: Not on file  . Emotionally Abused: Not on file  . Physically Abused: Not on file  . Sexually Abused: Not on file     Allergies  Allergen Reactions  . Penicillins Other (See Comments)    [facility] PATIENT HAS HAD A PCN REACTION WITH IMMEDIATE RASH, FACIAL/TONGUE/THROAT SWELLING, SOB, OR LIGHTHEADEDNESS WITH HYPOTENSION:   #  #  YES  #  # Has patient had a PCN reaction causing severe rash involving mucus membranes or skin necrosis: No Has patient had a PCN reaction that required hospitalization: No Has patient had a PCN reaction occurring within the last 10 years: No   . Vicodin [Hydrocodone-Acetaminophen] Other (See Comments)    Sped up heart and breathing     Outpatient Medications Prior to Visit  Medication Sig Dispense Refill  . acetaminophen (TYLENOL) 500 MG tablet Take 1,000 mg by mouth every 6 (six) hours as needed (pain).     Marland Kitchen ALPRAZolam (XANAX) 0.25 MG tablet Take 0.25 mg by mouth 2 (two) times daily as needed for anxiety.     Marland Kitchen amLODipine (NORVASC) 5 MG tablet Take 5 mg by mouth daily.  3  . aspirin 81 MG chewable tablet Chew 1 tablet (81 mg total) by mouth daily.    . Biotin 1000 MCG CHEW Chew 10 mg by mouth daily.    . carvedilol (COREG) 3.125 MG tablet Take 1 tablet (3.125 mg total) by mouth 2 (two) times daily with a meal. 180 tablet 1  . clopidogrel (PLAVIX) 75 MG tablet Take 1 tablet (75 mg total) by mouth daily with breakfast. 90 tablet 3  . dorzolamide-timolol (COSOPT) 22.3-6.8 MG/ML ophthalmic solution Instill 1 drop in both eyes twice daily    . ezetimibe (ZETIA) 10 MG tablet Take 10 mg by mouth daily.     Marland Kitchen latanoprost (XALATAN) 0.005 % ophthalmic solution Place 1 drop into both eyes at bedtime.     . Melatonin 5 MG CAPS Take 10 mg by mouth at bedtime as needed (sleep).    . nitroGLYCERIN (NITROSTAT) 0.4 MG SL tablet Place 1 tablet (0.4 mg total) under the tongue every 5 (five) minutes x 3 doses as needed for chest pain. 30 tablet 12  . osimertinib mesylate (TAGRISSO) 80 MG tablet Take 1 tablet (80 mg total) by mouth daily. 30 tablet 3  . VITAMIN D, CHOLECALCIFEROL, PO Take 5,000 Units by mouth daily.     Marland Kitchen atorvastatin (LIPITOR) 20 MG tablet Take 1 tablet (20 mg total) by mouth daily at 6 PM. (Patient not taking: Reported on 08/20/2019) 30 tablet 3   No facility-administered medications  prior to visit.      Review of Systems Constitutional:   No  weight loss, night sweats,  Fevers, chills, fatigue, lassitude. HEENT:   No headaches,  Difficulty swallowing,  Tooth/dental problems,  Sore throat,                No sneezing, itching, ear ache, nasal congestion, post nasal drip,   CV:  No chest pain,  Orthopnea, PND, swelling in lower extremities, anasarca, dizziness, palpitations  GI  No heartburn, indigestion, abdominal pain, nausea, vomiting, diarrhea, change in bowel habits, loss of appetite  Resp: No shortness of breath with exertion or at rest.  No excess mucus, no productive cough,  No non-productive cough,  No coughing up of blood.  No change in color of mucus.  No wheezing.  No chest wall deformity  Skin: no rash or lesions.  GU: no dysuria, change in color of urine, no urgency or frequency.  No flank pain.  MS:  No joint pain or swelling.  No decreased range of motion.  No back pain.  Psych:  No change in mood or affect. No depression or anxiety.  No memory loss.     Objective:   Physical Exam Vitals:   08/20/19 0955  BP: 127/66  Pulse: 79  Resp: 18  Temp: 98.2 F (36.8 C)  TempSrc: Oral  SpO2: 100%  Weight: 132 lb (59.9 kg)  Height: '5\' 4"'  (1.626 m)    Gen: Pleasant, well-nourished, in no distress,  normal affect  ENT: No lesions,  mouth clear,  oropharynx clear, no postnasal drip  Neck: No JVD, no TMG, no carotid bruits  Lungs: No use of accessory muscles, no dullness to percussion, clear without rales or rhonchi  Cardiovascular: RRR, heart sounds normal, no murmur or gallops, no peripheral edema  Abdomen: soft and NT, no HSM,  BS normal  Musculoskeletal: No deformities, no cyanosis or clubbing  Neuro: alert, non focal  Skin: Warm, no lesions or rashes Hepatic Function Latest Ref Rng & Units 08/19/2019 08/15/2019 06/16/2019  Total Protein 6.5 - 8.1 g/dL 7.9 6.8 -  Albumin 3.5 - 5.0 g/dL 4.2 4.4 3.2(L)  AST 15 - 41 U/L 43(H) 52(H) -  ALT  0 - 44 U/L 77(H) 91(H) -  Alk Phosphatase 38 - 126 U/L 75 81 -  Total Bilirubin 0.3 - 1.2 mg/dL 0.4 0.3 -  Bilirubin, Direct 0.00 - 0.40 mg/dL - 0.11 -   BMP Latest Ref Rng & Units 08/19/2019 06/17/2019 06/16/2019  Glucose 70 - 99 mg/dL 95 99 98  BUN 8 - 23 mg/dL '10 17 11  ' Creatinine 0.44 - 1.00 mg/dL 0.90 1.02(H) 0.98  Sodium 135 - 145 mmol/L 143 140 139  Potassium 3.5 - 5.1 mmol/L 4.8 3.6 3.8  Chloride 98 - 111 mmol/L 108 107 107  CO2 22 - 32 mmol/L '27 24 23  ' Calcium 8.9 - 10.3 mg/dL 9.7 9.2 9.3   CBC Latest Ref Rng & Units 08/19/2019 07/03/2019 06/17/2019  WBC 4.0 - 10.5 K/uL 4.6 5.6 5.2  Hemoglobin 12.0 - 15.0 g/dL 12.1 11.6 10.8(L)  Hematocrit 36.0 - 46.0 % 38.8 36.0 34.0(L)  Platelets 150 - 400 K/uL 205 291 170     Imaging studies reviewed in the [facility] system including the most recent CT scan of the chest which was ordered June 12, 2019.  There is stable disease in the right upper and right lower lobe and improvement in the right pleural effusion  She has an upcoming CT of the chest in the next month along with abdominal CT per Dr. Julien Nordmann    Assessment & Plan:  I personally reviewed all images and lab data in the [facility] system as well as any outside material available during this office visit and agree with the  radiology impressions.   Hypertension Hypertension under excellent control at this time  Continue Coreg at all and amlodipine without change in  dose    History of non-ST elevation myocardial infarction (NSTEMI) History of NSTEMI June 12, 2019 stable at this time with stent placement to the right coronary artery  Pleural effusion, right Pleural effusion on the right is stable and is a malignant effusion in nature and has not reaccumulated on chemotherapy  Adenocarcinoma of right lung, stage 4 (HCC) Adenocarcinoma of the right lung stage IV due to pleural involvement  Continued chemotherapy and follow-up per oncology  I do not think elevated liver  function test are due to the chemotherapy the patient is currently undergoing and doubt there is liver involvement from the cancer but upcoming CT of abdomen is pending  Abnormal liver function Abnormal liver function test which seem to be improving since atorvastatin has been held  Patient is on the Zetia at this time.  While strict control the LDL is important I am concerned given the patient's elevated liver function test that we should continue to hold the statin for now until she is seen by cardiology in later March  Repeat liver functions can be performed at the upcoming visit with cardiology in the next 2 weeks  Alternatives to statin therapy should be giving consideration  Hyperlipidemia As per discussions with elevated liver function test of asked the patient to continue Zetia but hold atorvastatin for now   Aundra was seen today for results.  Diagnoses and all orders for this visit:  Status post coronary artery stent placement  Adenocarcinoma of right lung, stage 4 (HCC)  Essential hypertension  Abnormal liver function  History of non-ST elevation myocardial infarction (NSTEMI)  Pleural effusion, right  Pure hypercholesterolemia  Other orders -     atorvastatin (LIPITOR) 20 MG tablet; HOLD FOR NOW

## 2019-08-20 NOTE — Assessment & Plan Note (Signed)
Hypertension under excellent control at this time  Continue Coreg at all and amlodipine without change in dose

## 2019-08-20 NOTE — Assessment & Plan Note (Signed)
As per discussions with elevated liver function test of asked the patient to continue Zetia but hold atorvastatin for now

## 2019-08-20 NOTE — Assessment & Plan Note (Signed)
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

## 2019-08-20 NOTE — Patient Instructions (Signed)
Hold atorvastatin for now  I will communicate with your other physicians on my recommendations and would suggest repeating liver functions in 2 weeks  Continue Zetia for now  An alternative to atorvastatin or other statins may be needed in your case due to your liver function changes  I did not think the liver function abnormalities are related to your chemotherapy as Newman Nip is not shown to have liver function changes  Keep your follow-up appointments with oncology your primary care doctor and cardiology and I will communicate with them

## 2019-08-20 NOTE — Assessment & Plan Note (Signed)
History of NSTEMI June 12, 2019 stable at this time with stent placement to the right coronary artery

## 2019-08-20 NOTE — Assessment & Plan Note (Signed)
Abnormal liver function test which seem to be improving since atorvastatin has been held  Patient is on the Zetia at this time.  While strict control the LDL is important I am concerned given the patient's elevated liver function test that we should continue to hold the statin for now until she is seen by cardiology in later March  Repeat liver functions can be performed at the upcoming visit with cardiology in the next 2 weeks  Alternatives to statin therapy should be giving consideration

## 2019-08-20 NOTE — Assessment & Plan Note (Signed)
Pleural effusion on the right is stable and is a malignant effusion in nature and has not reaccumulated on chemotherapy

## 2019-08-24 ENCOUNTER — Encounter: Payer: Self-pay | Admitting: Internal Medicine

## 2019-08-26 ENCOUNTER — Other Ambulatory Visit: Payer: Self-pay | Admitting: Medical Oncology

## 2019-08-26 DIAGNOSIS — C3491 Malignant neoplasm of unspecified part of right bronchus or lung: Secondary | ICD-10-CM

## 2019-08-26 MED ORDER — OSIMERTINIB MESYLATE 80 MG PO TABS: 80.0000 mg | ORAL_TABLET | Freq: Every day | ORAL | 3 refills | Status: DC

## 2019-08-29 ENCOUNTER — Encounter (HOSPITAL_COMMUNITY)
Admission: RE | Admit: 2019-08-29 | Discharge: 2019-08-29 | Disposition: A | Discharge status: Home / Self Care | Payer: Medicare HMO | Source: Ambulatory Visit | Attending: Cardiology | Admitting: Cardiology

## 2019-08-29 ENCOUNTER — Other Ambulatory Visit: Payer: Self-pay

## 2019-08-29 DIAGNOSIS — Z955 Presence of coronary angioplasty implant and graft: Secondary | ICD-10-CM | POA: Insufficient documentation

## 2019-08-29 DIAGNOSIS — I214 Non-ST elevation (NSTEMI) myocardial infarction: Secondary | ICD-10-CM | POA: Insufficient documentation

## 2019-08-29 NOTE — Progress Notes (Signed)
Nutrition Note: Virtual Visit Spoke with pt for virtual cardiac rehab. Pt attends yoga classes and a gym. She does weight training and walking.  Today reports feeling increased appetite and enjoyment of food. She has focused on moving more before meals and altering meal times. She does report feeling bloated as she has increased fiber intake. She is having bowel movements every other day vs every 3-4 days previously. Recommended daily walking, 64 oz water, and deep breathing to relax sympathetic nervous system as she reports high stress.  Diet recall reviewed. Nutrition goals reviewed. Reinforced information with teach back method Will continue to monitor pt with phone calls for virtual cardiac rehab.   Michaele Offer, MS, RDN, LDN

## 2019-08-30 NOTE — Progress Notes (Signed)
Cardiology Office Note:    Date:  09/01/2019   ID:  Allison [name], DOB April 13, 1951, MRN [id number]  PCP:  [name], MD  Cardiologist:  Donato Heinz, MD  Electrophysiologist:  None   Referring MD: [name], MD   Chief Complaint  Patient presents with  . Coronary Artery Disease    History of Present Illness:    Allison [name] is a 69 y.o. female with a hx of CAD status post DES to RCA 06/16/19, stage IV lung cancer, hypertension, hyperlipidemia who presents for follow-up.  She presented to ED on 06/12/2019 with chest pain, found to have NSTEMI with peak high-sensitivity troponin 9133.  TTE on 06/13/2019 showed LVEF 55 to 60%, mild LVH, low normal RV systolic function, moderate MR. Cath showed 95% proximal RCA lesion, drug-eluting stent was successfully placed.  Since last clinic visit, Allison [name] reports that she is doing well.  She denies any exertional chest pain.  Does report some dyspnea on exertion.  Reports that she has been having lower extremity edema at the end of the day.  States that it has been causing her some distress.  She has been taking aspirin and Plavix, denies any bleeding issues.  States that she felt very fatigued from taking statin, has improved since she stopped taking it.  Past Medical History:  Diagnosis Date  . Anxiety   . Arthritis   . Asthma    exercise induced  . Depression    PMH  . GERD (gastroesophageal reflux disease)   . Glaucoma   . Hypertension   . lung ca dx'd 11/2017   right  . Malignant pleural effusion    right  . PONV (postoperative nausea and vomiting)   . Pre-diabetes   . Raynaud's disease   . Raynaud's disease     Past Surgical History:  Procedure Laterality Date  . ABDOMINAL HYSTERECTOMY     partial  . CHEST TUBE INSERTION Right 01/01/2018   Procedure: INSERTION PLEURAL DRAINAGE CATHETER;  Surgeon: Ivin Poot, MD;  Location: [facility];  Service: Thoracic;  Laterality: Right;  . COLONOSCOPY     . CORONARY STENT INTERVENTION N/A 06/16/2019   Procedure: CORONARY STENT INTERVENTION;  Surgeon: Jettie Booze, MD;  Location: [facility];  Service: Cardiovascular;  Laterality: N/A;  . DILATION AND CURETTAGE OF UTERUS    . EYE SURGERY     due to Glaucoma  . LEFT HEART CATH AND CORONARY ANGIOGRAPHY N/A 06/16/2019   Procedure: LEFT HEART CATH AND CORONARY ANGIOGRAPHY;  Surgeon: Jettie Booze, MD;  Location: [facility];  Service: Cardiovascular;  Laterality: N/A;  . REMOVAL OF PLEURAL DRAINAGE CATHETER Right 11/07/2018   Procedure: REMOVAL OF PLEURAL DRAINAGE CATHETER;  Surgeon: Ivin Poot, MD;  Location: [facility];  Service: Thoracic;  Laterality: Right;  . ROTATOR CUFF REPAIR    . TUBAL LIGATION    . WISDOM TOOTH EXTRACTION      Current Medications: Current Meds  Medication Sig  . acetaminophen (TYLENOL) 500 MG tablet Take 1,000 mg by mouth every 6 (six) hours as needed (pain).   Marland Kitchen ALPRAZolam (XANAX) 0.25 MG tablet Take 0.25 mg by mouth 2 (two) times daily as needed for anxiety.   Marland Kitchen aspirin 81 MG chewable tablet Chew 1 tablet (81 mg total) by mouth daily.  Marland Kitchen atorvastatin (LIPITOR) 20 MG tablet HOLD FOR NOW  . Biotin 1000 MCG CHEW Chew 10 mg by mouth daily.  . carvedilol (COREG)  6.25 MG tablet Take 1 tablet (6.25 mg total) by mouth 2 (two) times daily with a meal.  . clopidogrel (PLAVIX) 75 MG tablet Take 1 tablet (75 mg total) by mouth daily with breakfast.  . dorzolamide-timolol (COSOPT) 22.3-6.8 MG/ML ophthalmic solution Instill 1 drop in both eyes twice daily  . ezetimibe (ZETIA) 10 MG tablet Take 10 mg by mouth daily.   Marland Kitchen latanoprost (XALATAN) 0.005 % ophthalmic solution Place 1 drop into both eyes at bedtime.   . Melatonin 5 MG CAPS Take 10 mg by mouth at bedtime as needed (sleep).  . nitroGLYCERIN (NITROSTAT) 0.4 MG SL tablet Place 1 tablet (0.4 mg total) under the tongue every 5 (five) minutes x 3 doses as needed for chest pain.  Marland Kitchen osimertinib  mesylate (TAGRISSO) 80 MG tablet Take 1 tablet (80 mg total) by mouth daily.  Marland Kitchen VITAMIN D, CHOLECALCIFEROL, PO Take 5,000 Units by mouth daily.   . [DISCONTINUED] amLODipine (NORVASC) 5 MG tablet Take 5 mg by mouth daily.  . [DISCONTINUED] carvedilol (COREG) 3.125 MG tablet Take 1 tablet (3.125 mg total) by mouth 2 (two) times daily with a meal.     Allergies:   Penicillins and Vicodin [hydrocodone-acetaminophen]   Social History   Socioeconomic History  . Marital status: Widowed    Spouse name: Not on file  . Number of children: 3  . Years of education: Not on file  . Highest education level: Not on file  Occupational History    Employer: [employer]  Tobacco Use  . Smoking status: Never Smoker  . Smokeless tobacco: Never Used  Substance and Sexual Activity  . Alcohol use: Yes    Comment: up to 3 drinks per week  . Drug use: No    Comment: CBD oil   . Sexual activity: Not Currently    Comment: Hysterectomy  Other Topics Concern  . Not on file  Social History Narrative  . Not on file   Social Determinants of Health   Financial Resource Strain: Low Risk   . Difficulty of Paying Living Expenses: Not hard at all  Food Insecurity: No Food Insecurity  . Worried About Charity fundraiser in the Last Year: Never true  . Ran Out of Food in the Last Year: Never true  Transportation Needs: No Transportation Needs  . Lack of Transportation (Medical): No  . Lack of Transportation (Non-Medical): No  Physical Activity: Sufficiently Active  . Days of Exercise per Week: 5 days  . Minutes of Exercise per Session: 30 min  Stress:   . Feeling of Stress :   Social Connections: Unknown  . Frequency of Communication with Friends and Family: Three times a week  . Frequency of Social Gatherings with Friends and Family: Once a week  . Attends Religious Services: Not on file  . Active Member of Clubs or Organizations: Yes  . Attends Archivist Meetings: More than 4 times per year   . Marital Status: Living with partner     Family History: The patient's family history includes Heart disease in her brother and sister; Lung cancer in an other family member.  ROS:   Please see the history of present illness.     All other systems reviewed and are negative.  EKGs/Labs/Other Studies Reviewed:    The following studies were reviewed today:   EKG:  EKG is not ordered today.   Cath 06/16/19:  Prox LAD lesion is 25% stenosed.  Prox RCA lesion is 95%  stenosed.  A drug-eluting stent was successfully placed using a SYNERGY XD 2.25X28, postdilated to 2.5 mm.  Post intervention, there is a 0% residual stenosis.  The left ventricular systolic function is normal.  LV end diastolic pressure is normal.  The left ventricular ejection fraction is 55-65% by visual estimate.  There is no aortic valve stenosis.   Continue dual antiplatelet therapy.  Would check with Pharmacy about interactions of Plavix with Tagrisso.  If there is an interaction, would switch to Brilinta.       TTE 06/13/19: 1. Left ventricular ejection fraction, by visual estimation, is 55 to  60%. The left ventricle has normal function. There is mildly increased  left ventricular hypertrophy.  2. The left ventricle has no regional wall motion abnormalities.  3. Global right ventricle has low normal systolic function.The right  ventricular size is normal. No increase in right ventricular wall  thickness.  4. Left atrial size was normal.  5. Right atrial size was normal.  6. Mild mitral annular calcification.  7. The mitral valve is degenerative. Moderate mitral valve regurgitation.  8. The tricuspid valve is grossly normal.  9. The aortic valve is tricuspid. Aortic valve regurgitation is not  visualized. No evidence of aortic valve sclerosis or stenosis.  10. The pulmonic valve was grossly normal. Pulmonic valve regurgitation is  not visualized.  11. Normal pulmonary artery systolic  pressure.  12. The inferior vena cava is normal in size with greater than 50%  respiratory variability, suggesting right atrial pressure of 3 mmHg.   Carotid duplex 08/01/19: Right Carotid: There is no evidence of stenosis in the right ICA. The         extracranial vessels were near-normal with only minimal  wall         thickening or plaque.   Left Carotid: Velocities in the left ICA are consistent with a 1-39%  stenosis.        Non-hemodynamically significant plaque <50% noted in the  CCA.   Vertebrals: Bilateral vertebral arteries demonstrate antegrade flow.  Subclavians: Normal flow hemodynamics were seen in bilateral subclavian        arteries.   Recent Labs: 06/13/2019: B Natriuretic Peptide 177.4; TSH 1.774 06/17/2019: Magnesium 1.8 08/19/2019: ALT 77; BUN 10; Creatinine 0.90; Hemoglobin 12.1; Platelet Count 205; Potassium 4.8; Sodium 143  Recent Lipid Panel    Component Value Date/Time   CHOL 119 08/15/2019 0925   TRIG 31 08/15/2019 0925   HDL 66 08/15/2019 0925   CHOLHDL 1.8 08/15/2019 0925   CHOLHDL 4.1 06/13/2019 0100   VLDL 13 06/13/2019 0100   LDLCALC 44 08/15/2019 0925    Physical Exam:    VS:  BP (!) 130/52   Pulse 63   Temp (!) 97.3 F (36.3 C) (Temporal)   Resp 15   Ht 5\' 4"  (1.626 m)   Wt 131 lb 12.8 oz (59.8 kg)   LMP  (LMP Unknown)   SpO2 99%   BMI 22.62 kg/m     Wt Readings from Last 3 Encounters:  09/01/19 131 lb 12.8 oz (59.8 kg)  08/20/19 132 lb (59.9 kg)  08/19/19 133 lb (60.3 kg)     GEN:  Well nourished, well developed in no acute distress HEENT: Normal NECK: No JVD CARDIAC: RRR, no murmurs, rubs, gallops RESPIRATORY:  Clear to auscultation without rales, wheezing or rhonchi  ABDOMEN: Soft, non-tender, non-distended MUSCULOSKELETAL:  No edema SKIN: Warm and dry NEUROLOGIC:  Alert and oriented x 3 PSYCHIATRIC:  Normal  affect   ASSESSMENT:    1. Coronary artery disease involving native coronary  artery of native heart without angina pectoris   2. Elevated liver function tests   3. Hyperlipidemia, unspecified hyperlipidemia type   4. Essential hypertension    PLAN:    CAD: Status post drug-eluting stent to RCA on 06/16/2019.  Denies anginal symptoms.   -Continue aspirin 81 mg daily, Plavix 75 mg daily.  Will continue DAPT for 12 months -Increase Coreg to 6.25 mg twice daily -Has not been taking Lipitor due to transaminitis.  Will recheck LFTs and consider restarting low-dose statin  Carotid bruit: 1 to 39% left carotid artery stenosis, no stenosis in right coronary artery  Hyperlipidemia: LDL 44 08/15/19 while on Lipitor 80 mg daily, but has been held recently due to transaminitis  Hypertension: On carvedilol 3.125 mg twice daily and amlodipine 5 mg daily.  She reports lower extremity edema, which may be due to amlodipine use.  Will discontinue amlodipine and increase carvedilol to 6.25 mg twice daily  Stage IV lung cancer: Followed by oncology service.  On Tagrisso  RTC in 3 months  Medication Adjustments/Labs and Tests Ordered: Current medicines are reviewed at length with the patient today.  Concerns regarding medicines are outlined above.  Orders Placed This Encounter  Procedures  . Hepatic function panel   Meds ordered this encounter  Medications  . carvedilol (COREG) 6.25 MG tablet    Sig: Take 1 tablet (6.25 mg total) by mouth 2 (two) times daily with a meal.    Dispense:  180 tablet    Refill:  3    Patient Instructions  Medication Instructions:  STOP amlodipine INCREASE carvedilol (Coreg) to 6.25 mg two times daily  *If you need a refill on your cardiac medications before your next appointment, please call your pharmacy*   Lab Work: Today (LFTs) If you have labs (blood work) drawn today and your tests are completely normal, you will receive your results only by: Marland Kitchen MyChart Message (if you have MyChart) OR . A paper copy in the mail If you have any lab  test that is abnormal or we need to change your treatment, we will call you to review the results.  Follow-Up: At [facility], you and your health needs are our priority.  As part of our continuing mission to provide you with exceptional heart care, we have created designated Provider Care Teams.  These Care Teams include your primary Cardiologist (physician) and Advanced Practice Providers (APPs -  Physician Assistants and Nurse Practitioners) who all work together to provide you with the care you need, when you need it.  We recommend signing up for the patient portal called "MyChart".  Sign up information is provided on this After Visit Summary.  MyChart is used to connect with patients for Virtual Visits (Telemedicine).  Patients are able to view lab/test results, encounter notes, upcoming appointments, etc.  Non-urgent messages can be sent to your provider as well.   To learn more about what you can do with MyChart, go to [email or web address].    Your next appointment:   3 month(s)  The format for your next appointment:   In Person  Provider:   Oswaldo Milian, MD   Other Instructions Check blood pressure daily at home, write it down and call in 2 weeks with readings for Dr. Gardiner Rhyme to review.      Signed, Donato Heinz, MD  09/01/2019 10:44 AM    [place]

## 2019-09-01 ENCOUNTER — Ambulatory Visit (INDEPENDENT_AMBULATORY_CARE_PROVIDER_SITE_OTHER): Payer: Medicare HMO | Admitting: Cardiology

## 2019-09-01 ENCOUNTER — Other Ambulatory Visit: Payer: Self-pay

## 2019-09-01 ENCOUNTER — Encounter: Payer: Self-pay | Admitting: Cardiology

## 2019-09-01 VITALS — BP 130/52 | HR 63 | Temp 97.3°F | Resp 15 | Ht 64.0 in | Wt 131.8 lb

## 2019-09-01 DIAGNOSIS — E785 Hyperlipidemia, unspecified: Secondary | ICD-10-CM | POA: Diagnosis not present

## 2019-09-01 DIAGNOSIS — I2510 Atherosclerotic heart disease of native coronary artery without angina pectoris: Secondary | ICD-10-CM | POA: Diagnosis not present

## 2019-09-01 DIAGNOSIS — R7989 Other specified abnormal findings of blood chemistry: Secondary | ICD-10-CM

## 2019-09-01 DIAGNOSIS — I10 Essential (primary) hypertension: Secondary | ICD-10-CM | POA: Diagnosis not present

## 2019-09-01 LAB — HEPATIC FUNCTION PANEL
ALT: 79 IU/L — ABNORMAL HIGH (ref 0–32)
AST: 57 IU/L — ABNORMAL HIGH (ref 0–40)
Albumin: 4.7 g/dL (ref 3.8–4.8)
Alkaline Phosphatase: 79 IU/L (ref 39–117)
Bilirubin Total: 0.3 mg/dL (ref 0.0–1.2)
Bilirubin, Direct: 0.08 mg/dL (ref 0.00–0.40)
Total Protein: 6.9 g/dL (ref 6.0–8.5)

## 2019-09-01 MED ORDER — CARVEDILOL 6.25 MG PO TABS: 6.2500 mg | ORAL_TABLET | Freq: Two times a day (BID) | ORAL | 3 refills | Status: DC

## 2019-09-01 NOTE — Patient Instructions (Signed)
Medication Instructions:  STOP amlodipine INCREASE carvedilol (Coreg) to 6.25 mg two times daily  *If you need a refill on your cardiac medications before your next appointment, please call your pharmacy*   Lab Work: Today (LFTs) If you have labs (blood work) drawn today and your tests are completely normal, you will receive your results only by: Marland Kitchen MyChart Message (if you have MyChart) OR . A paper copy in the mail If you have any lab test that is abnormal or we need to change your treatment, we will call you to review the results.  Follow-Up: At [facility], you and your health needs are our priority.  As part of our continuing mission to provide you with exceptional heart care, we have created designated Provider Care Teams.  These Care Teams include your primary Cardiologist (physician) and Advanced Practice Providers (APPs -  Physician Assistants and Nurse Practitioners) who all work together to provide you with the care you need, when you need it.  We recommend signing up for the patient portal called "MyChart".  Sign up information is provided on this After Visit Summary.  MyChart is used to connect with patients for Virtual Visits (Telemedicine).  Patients are able to view lab/test results, encounter notes, upcoming appointments, etc.  Non-urgent messages can be sent to your provider as well.   To learn more about what you can do with MyChart, go to [email or web address].    Your next appointment:   3 month(s)  The format for your next appointment:   In Person  Provider:   Oswaldo Milian, MD   Other Instructions Check blood pressure daily at home, write it down and call in 2 weeks with readings for Dr. Gardiner Rhyme to review.

## 2019-09-17 ENCOUNTER — Other Ambulatory Visit: Payer: Self-pay

## 2019-09-17 ENCOUNTER — Ambulatory Visit: Payer: Medicare HMO | Admitting: Physician Assistant

## 2019-09-17 VITALS — BP 161/81 | HR 60 | Temp 98.2°F | Resp 18 | Ht 64.0 in | Wt 131.0 lb

## 2019-09-17 DIAGNOSIS — I252 Old myocardial infarction: Secondary | ICD-10-CM

## 2019-09-17 DIAGNOSIS — I10 Essential (primary) hypertension: Secondary | ICD-10-CM

## 2019-09-17 DIAGNOSIS — C3491 Malignant neoplasm of unspecified part of right bronchus or lung: Secondary | ICD-10-CM

## 2019-09-17 NOTE — Patient Instructions (Addendum)
Make sure to send a message/ or call your Cardiologist with your BP readings.  Keep up the great work of being a wonderful self advocate for your health!!   Managing Your Hypertension Hypertension is commonly called high blood pressure. This is when the force of your blood pressing against the walls of your arteries is too strong. Arteries are blood vessels that carry blood from your heart throughout your body. Hypertension forces the heart to work harder to pump blood, and may cause the arteries to become narrow or stiff. Having untreated or uncontrolled hypertension can cause heart attack, stroke, kidney disease, and other problems. What are blood pressure readings? A blood pressure reading consists of a higher number over a lower number. Ideally, your blood pressure should be below 120/80. The first ("top") number is called the systolic pressure. It is a measure of the pressure in your arteries as your heart beats. The second ("bottom") number is called the diastolic pressure. It is a measure of the pressure in your arteries as the heart relaxes. What does my blood pressure reading mean? Blood pressure is classified into four stages. Based on your blood pressure reading, your health care provider may use the following stages to determine what type of treatment you need, if any. Systolic pressure and diastolic pressure are measured in a unit called mm Hg. Normal  Systolic pressure: below 956.  Diastolic pressure: below 80. Elevated  Systolic pressure: 387-564.  Diastolic pressure: below 80. Hypertension stage 1  Systolic pressure: 332-951.  Diastolic pressure: 88-41. Hypertension stage 2  Systolic pressure: 660 or above.  Diastolic pressure: 90 or above. What health risks are associated with hypertension? Managing your hypertension is an important responsibility. Uncontrolled hypertension can lead to:  A heart attack.  A stroke.  A weakened blood vessel (aneurysm).  Heart  failure.  Kidney damage.  Eye damage.  Metabolic syndrome.  Memory and concentration problems. What changes can I make to manage my hypertension? Hypertension can be managed by making lifestyle changes and possibly by taking medicines. Your health care provider will help you make a plan to bring your blood pressure within a normal range. Eating and drinking   Eat a diet that is high in fiber and potassium, and low in salt (sodium), added sugar, and fat. An example eating plan is called the DASH (Dietary Approaches to Stop Hypertension) diet. To eat this way: ? Eat plenty of fresh fruits and vegetables. Try to fill half of your plate at each meal with fruits and vegetables. ? Eat whole grains, such as whole wheat pasta, brown rice, or whole grain bread. Fill about one quarter of your plate with whole grains. ? Eat low-fat diary products. ? Avoid fatty cuts of meat, processed or cured meats, and poultry with skin. Fill about one quarter of your plate with lean proteins such as fish, chicken without skin, beans, eggs, and tofu. ? Avoid premade and processed foods. These tend to be higher in sodium, added sugar, and fat.  Reduce your daily sodium intake. Most people with hypertension should eat less than 1,500 mg of sodium a day.  Limit alcohol intake to no more than 1 drink a day for nonpregnant women and 2 drinks a day for men. One drink equals 12 oz of beer, 5 oz of wine, or 1 oz of hard liquor. Lifestyle  Work with your health care provider to maintain a healthy body weight, or to lose weight. Ask what an ideal weight is for you.  Get at least 30 minutes of exercise that causes your heart to beat faster (aerobic exercise) most days of the week. Activities may include walking, swimming, or biking.  Include exercise to strengthen your muscles (resistance exercise), such as weight lifting, as part of your weekly exercise routine. Try to do these types of exercises for 30 minutes at least  3 days a week.  Do not use any products that contain nicotine or tobacco, such as cigarettes and e-cigarettes. If you need help quitting, ask your health care provider.  Control any long-term (chronic) conditions you have, such as high cholesterol or diabetes. Monitoring  Monitor your blood pressure at home as told by your health care provider. Your personal target blood pressure may vary depending on your medical conditions, your age, and other factors.  Have your blood pressure checked regularly, as often as told by your health care provider. Working with your health care provider  Review all the medicines you take with your health care provider because there may be side effects or interactions.  Talk with your health care provider about your diet, exercise habits, and other lifestyle factors that may be contributing to hypertension.  Visit your health care provider regularly. Your health care provider can help you create and adjust your plan for managing hypertension. Will I need medicine to control my blood pressure? Your health care provider may prescribe medicine if lifestyle changes are not enough to get your blood pressure under control, and if:  Your systolic blood pressure is 130 or higher.  Your diastolic blood pressure is 80 or higher. Take medicines only as told by your health care provider. Follow the directions carefully. Blood pressure medicines must be taken as prescribed. The medicine does not work as well when you skip doses. Skipping doses also puts you at risk for problems. Contact a health care provider if:  You think you are having a reaction to medicines you have taken.  You have repeated (recurrent) headaches.  You feel dizzy.  You have swelling in your ankles.  You have trouble with your vision. Get help right away if:  You develop a severe headache or confusion.  You have unusual weakness or numbness, or you feel faint.  You have severe pain in your  chest or abdomen.  You vomit repeatedly.  You have trouble breathing. Summary  Hypertension is when the force of blood pumping through your arteries is too strong. If this condition is not controlled, it may put you at risk for serious complications.  Your personal target blood pressure may vary depending on your medical conditions, your age, and other factors. For most people, a normal blood pressure is less than 120/80.  Hypertension is managed by lifestyle changes, medicines, or both. Lifestyle changes include weight loss, eating a healthy, low-sodium diet, exercising more, and limiting alcohol. This information is not intended to replace advice given to you by your health care provider. Make sure you discuss any questions you have with your health care provider. Document Revised: 09/27/2018 Document Reviewed: 05/03/2016 Elsevier Patient Education  [place].

## 2019-09-17 NOTE — Progress Notes (Signed)
Established Patient Office Visit  Subjective:  Patient ID: Allison [name], female    DOB: May 05, 1951  Age: 69 y.o. MRN: [id number]  CC:  Chief Complaint  Patient presents with  . Hypertension    HPI Allison [name] reports that she has been having elevated blood pressure readings.  States that she was seen by cardiology in September 01, 2019, had complaints of bilateral lower edema at night, amlodipine 10 mg was stopped and Coreg was increased to 6.25 mg twice a day.  Reports that bilateral lower edema did resolve, but unfortunately she started having elevated blood pressure readings.  She restarted on her own amlodipine 5 mg on March 26, bilateral lower edema did not return, but does continue to elevated blood pressure readings, averaging 150/80, did have 1 low blood pressure reading of 109/82.  Is due to have repeat liver enzymes next week, patient stopped her statin due to elevated liver enzymes has been taking-acetylcysteine as directed by cardiology  She denies any hypertensive has been continuing with her exercise regimen   Assessment and plan from cardiology September 01, 2019: " CAD: Status post drug-eluting stent to RCA on 06/16/2019.  Denies anginal symptoms.   -Continue aspirin 81 mg daily, Plavix 75 mg daily.  Will continue DAPT for 12 months -Increase Coreg to 6.25 mg twice daily -Has not been taking Lipitor due to transaminitis.  Will recheck LFTs and consider restarting low-dose statin  Carotid bruit: 1 to 39% left carotid artery stenosis, no stenosis in right coronary artery  Hyperlipidemia: LDL 44 08/15/19 while on Lipitor 80 mg daily, but has been held recently due to transaminitis  Hypertension: On carvedilol 3.125 mg twice daily and amlodipine 5 mg daily.  She reports lower extremity edema, which may be due to amlodipine use.  Will discontinue amlodipine and increase carvedilol to 6.25 mg twice daily  Stage IV lung cancer: Followed by oncology service. On [place]"      Past Medical History:  Diagnosis Date  . Anxiety   . Arthritis   . Asthma    exercise induced  . Depression    PMH  . GERD (gastroesophageal reflux disease)   . Glaucoma   . Hypertension   . lung ca dx'd 11/2017   right  . Malignant pleural effusion    right  . PONV (postoperative nausea and vomiting)   . Pre-diabetes   . Raynaud's disease   . Raynaud's disease     Past Surgical History:  Procedure Laterality Date  . ABDOMINAL HYSTERECTOMY     partial  . CHEST TUBE INSERTION Right 01/01/2018   Procedure: INSERTION PLEURAL DRAINAGE CATHETER;  Surgeon: Ivin Poot, MD;  Location: [facility];  Service: Thoracic;  Laterality: Right;  . COLONOSCOPY    . CORONARY STENT INTERVENTION N/A 06/16/2019   Procedure: CORONARY STENT INTERVENTION;  Surgeon: Jettie Booze, MD;  Location: [facility];  Service: Cardiovascular;  Laterality: N/A;  . DILATION AND CURETTAGE OF UTERUS    . EYE SURGERY     due to Glaucoma  . LEFT HEART CATH AND CORONARY ANGIOGRAPHY N/A 06/16/2019   Procedure: LEFT HEART CATH AND CORONARY ANGIOGRAPHY;  Surgeon: Jettie Booze, MD;  Location: [facility];  Service: Cardiovascular;  Laterality: N/A;  . REMOVAL OF PLEURAL DRAINAGE CATHETER Right 11/07/2018   Procedure: REMOVAL OF PLEURAL DRAINAGE CATHETER;  Surgeon: Ivin Poot, MD;  Location: [facility];  Service: Thoracic;  Laterality: Right;  . ROTATOR CUFF REPAIR    .  TUBAL LIGATION    . WISDOM TOOTH EXTRACTION      Family History  Problem Relation Age of Onset  . Heart disease Sister   . Heart disease Brother   . Lung cancer Other     Social History   Socioeconomic History  . Marital status: Widowed    Spouse name: Not on file  . Number of children: 3  . Years of education: Not on file  . Highest education level: Not on file  Occupational History    Employer: [employer]  Tobacco Use  . Smoking status: Never Smoker  . Smokeless tobacco: Never Used  Substance and  Sexual Activity  . Alcohol use: Yes    Comment: up to 3 drinks per week  . Drug use: No    Comment: CBD oil   . Sexual activity: Not Currently    Comment: Hysterectomy  Other Topics Concern  . Not on file  Social History Narrative  . Not on file   Social Determinants of Health   Financial Resource Strain: Low Risk   . Difficulty of Paying Living Expenses: Not hard at all  Food Insecurity: No Food Insecurity  . Worried About Charity fundraiser in the Last Year: Never true  . Ran Out of Food in the Last Year: Never true  Transportation Needs: No Transportation Needs  . Lack of Transportation (Medical): No  . Lack of Transportation (Non-Medical): No  Physical Activity: Sufficiently Active  . Days of Exercise per Week: 5 days  . Minutes of Exercise per Session: 30 min  Stress:   . Feeling of Stress :   Social Connections: Unknown  . Frequency of Communication with Friends and Family: Three times a week  . Frequency of Social Gatherings with Friends and Family: Once a week  . Attends Religious Services: Not on file  . Active Member of Clubs or Organizations: Yes  . Attends Archivist Meetings: More than 4 times per year  . Marital Status: Living with partner  Intimate Partner Violence:   . Fear of Current or Ex-Partner:   . Emotionally Abused:   Marland Kitchen Physically Abused:   . Sexually Abused:     Outpatient Medications Prior to Visit  Medication Sig Dispense Refill  . acetaminophen (TYLENOL) 500 MG tablet Take 1,000 mg by mouth every 6 (six) hours as needed (pain).     Marland Kitchen ALPRAZolam (XANAX) 0.25 MG tablet Take 0.25 mg by mouth 2 (two) times daily as needed for anxiety.     Marland Kitchen amLODipine (NORVASC) 5 MG tablet Take 5 mg by mouth daily.    Marland Kitchen aspirin 81 MG chewable tablet Chew 1 tablet (81 mg total) by mouth daily.    Marland Kitchen atorvastatin (LIPITOR) 20 MG tablet HOLD FOR NOW 30 tablet 3  . Biotin 1000 MCG CHEW Chew 10 mg by mouth daily.    . carvedilol (COREG) 6.25 MG tablet Take  1 tablet (6.25 mg total) by mouth 2 (two) times daily with a meal. 180 tablet 3  . clopidogrel (PLAVIX) 75 MG tablet Take 1 tablet (75 mg total) by mouth daily with breakfast. 90 tablet 3  . dorzolamide-timolol (COSOPT) 22.3-6.8 MG/ML ophthalmic solution Instill 1 drop in both eyes twice daily    . ezetimibe (ZETIA) 10 MG tablet Take 10 mg by mouth daily.     Marland Kitchen latanoprost (XALATAN) 0.005 % ophthalmic solution Place 1 drop into both eyes at bedtime.     . Melatonin 5 MG CAPS  Take 10 mg by mouth at bedtime as needed (sleep).    . nitroGLYCERIN (NITROSTAT) 0.4 MG SL tablet Place 1 tablet (0.4 mg total) under the tongue every 5 (five) minutes x 3 doses as needed for chest pain. 30 tablet 12  . osimertinib mesylate (TAGRISSO) 80 MG tablet Take 1 tablet (80 mg total) by mouth daily. 30 tablet 3  . VITAMIN D, CHOLECALCIFEROL, PO Take 5,000 Units by mouth daily.      No facility-administered medications prior to visit.    Allergies  Allergen Reactions  . Penicillins Other (See Comments)    SYNCOPE PATIENT HAS HAD A PCN REACTION WITH IMMEDIATE RASH, FACIAL/TONGUE/THROAT SWELLING, SOB, OR LIGHTHEADEDNESS WITH HYPOTENSION:  #  #  YES  #  # Has patient had a PCN reaction causing severe rash involving mucus membranes or skin necrosis: No Has patient had a PCN reaction that required hospitalization: No Has patient had a PCN reaction occurring within the last 10 years: No   . Vicodin [Hydrocodone-Acetaminophen] Other (See Comments)    Sped up heart and breathing    ROS Review of Systems  Constitutional: Negative.   HENT: Negative.   Eyes: Negative.   Respiratory: Positive for chest tightness. Negative for shortness of breath and wheezing.   Cardiovascular: Negative for chest pain, palpitations and leg swelling.  Gastrointestinal: Negative.   Endocrine: Negative.   Genitourinary: Negative.   Musculoskeletal: Negative.   Skin: Negative.   Allergic/Immunologic: Negative.   Neurological:  Negative.   Hematological: Negative.   Psychiatric/Behavioral: Negative.       Objective:    Physical Exam  Constitutional: She is oriented to person, place, and time. She appears well-developed and well-nourished.  HENT:  Head: Normocephalic and atraumatic.  Right Ear: External ear normal.  Left Ear: External ear normal.  Nose: Nose normal.  Mouth/Throat: Oropharynx is clear and moist.  Eyes: Pupils are equal, round, and reactive to light. Conjunctivae and EOM are normal.  Cardiovascular: Normal rate, regular rhythm and normal heart sounds.  Pulmonary/Chest: Effort normal and breath sounds normal.  Abdominal: Soft. Bowel sounds are normal.  Musculoskeletal:        General: Normal range of motion.     Cervical back: Normal range of motion and neck supple.  Neurological: She is alert and oriented to person, place, and time.  Skin: Skin is warm and dry.  Psychiatric: She has a normal mood and affect. Her behavior is normal. Judgment and thought content normal.  Nursing note and vitals reviewed.   BP (!) 161/81 (BP Location: Left Arm)   Pulse 60   Temp 98.2 F (36.8 C) (Oral)   Resp 18   Ht 5\' 4"  (1.626 m)   Wt 131 lb (59.4 kg)   LMP  (LMP Unknown)   SpO2 100%   BMI 22.49 kg/m  Wt Readings from Last 3 Encounters:  09/17/19 131 lb (59.4 kg)  09/01/19 131 lb 12.8 oz (59.8 kg)  08/20/19 132 lb (59.9 kg)     Health Maintenance Due  Topic Date Due  . Hepatitis C Screening  Never done  . COLONOSCOPY  Never done  . MAMMOGRAM  07/27/2013    There are no preventive care reminders to display for this patient.  Lab Results  Component Value Date   TSH 1.774 06/13/2019   Lab Results  Component Value Date   WBC 4.6 08/19/2019   HGB 12.1 08/19/2019   HCT 38.8 08/19/2019   MCV 83.8 08/19/2019   PLT 205  08/19/2019   Lab Results  Component Value Date   NA 143 08/19/2019   K 4.8 08/19/2019   CO2 27 08/19/2019   GLUCOSE 95 08/19/2019   BUN 10 08/19/2019    CREATININE 0.90 08/19/2019   BILITOT 0.3 09/01/2019   ALKPHOS 79 09/01/2019   AST 57 (H) 09/01/2019   ALT 79 (H) 09/01/2019   PROT 6.9 09/01/2019   ALBUMIN 4.7 09/01/2019   CALCIUM 9.7 08/19/2019   ANIONGAP 8 08/19/2019   Lab Results  Component Value Date   CHOL 119 08/15/2019   Lab Results  Component Value Date   HDL 66 08/15/2019   Lab Results  Component Value Date   LDLCALC 44 08/15/2019   Lab Results  Component Value Date   TRIG 31 08/15/2019   Lab Results  Component Value Date   CHOLHDL 1.8 08/15/2019   Lab Results  Component Value Date   HGBA1C 5.5 06/13/2019      Assessment & Plan:   Problem List Items Addressed This Visit      Cardiovascular and Mediastinum   Hypertension - Primary   Relevant Medications   amLODipine (NORVASC) 5 MG tablet     Respiratory   Adenocarcinoma of right lung, stage 4 (HCC)     Other   History of non-ST elevation myocardial infarction (NSTEMI)     1. Esential hypertension Patient was encouraged to send her blood pressure medication readings to her cardiologist yesterday, encouraged patient to contact cardiology and update them on current blood pressure medication readings and the fact that she restarted a lower dose of amlodipine.  Continue follow-up with primary care provider to monitor liver enzymes  2. History of non-ST elevation myocardial infarction (NSTEMI)   3. Adenocarcinoma of right lung, stage 4 ([facility])   I have reviewed the patient's medical history (PMH, PSH, Social History, Family History, Medications, and allergies) , and have been updated if relevant. I spent 20 minutes reviewing chart and  face to face time with patient.   No orders of the defined types were placed in this encounter.   Follow-up: Return if symptoms worsen or fail to improve.    Loraine Grip Mayers, PA-C

## 2019-09-19 ENCOUNTER — Other Ambulatory Visit: Payer: Self-pay

## 2019-09-19 ENCOUNTER — Encounter (HOSPITAL_COMMUNITY)
Admission: RE | Admit: 2019-09-19 | Discharge: 2019-09-19 | Disposition: A | Discharge status: Home / Self Care | Payer: Medicare HMO | Source: Ambulatory Visit | Attending: Cardiology | Admitting: Cardiology

## 2019-09-19 DIAGNOSIS — I214 Non-ST elevation (NSTEMI) myocardial infarction: Secondary | ICD-10-CM | POA: Insufficient documentation

## 2019-09-19 DIAGNOSIS — Z955 Presence of coronary angioplasty implant and graft: Secondary | ICD-10-CM | POA: Insufficient documentation

## 2019-09-19 NOTE — Progress Notes (Signed)
Nutrition Note: Virtual Visit  Spoke with pt for virtual cardiac rehab. Pt is exercising at the gym with her significant other at least 5 days per week.  We reviewed nutrition goals and diet recall. She reports relief of constipation with increased water and activity. Her appetite is still poor due to her meds but she feels the strategies we have discussed help her increase her intake. She has gained 3 lbs. Reviewed recent BP log. She reports higher numbers due to her doctor taking her off amlodipine after lower leg edema. She reports no edema but higher BP readings. She has reported to MD. We reviewed low sodium nutrition therapy. Recommended 1500 mg/day.  We also discussed deep breathing.  Will continue to monitor pt with phone calls for virtual cardiac rehab.   Michaele Offer, MS, RDN, LDN

## 2019-09-24 DIAGNOSIS — R7401 Elevation of levels of liver transaminase levels: Secondary | ICD-10-CM | POA: Diagnosis not present

## 2019-09-29 ENCOUNTER — Encounter (HOSPITAL_COMMUNITY): Payer: Self-pay

## 2019-09-29 ENCOUNTER — Ambulatory Visit (HOSPITAL_COMMUNITY)
Admission: RE | Admit: 2019-09-29 | Discharge: 2019-09-29 | Disposition: A | Discharge status: Home / Self Care | Payer: Medicare HMO | Source: Ambulatory Visit | Attending: Internal Medicine | Admitting: Internal Medicine

## 2019-09-29 ENCOUNTER — Inpatient Hospital Stay: Payer: Medicare HMO | Attending: Internal Medicine

## 2019-09-29 ENCOUNTER — Other Ambulatory Visit: Payer: Self-pay

## 2019-09-29 DIAGNOSIS — R69 Illness, unspecified: Secondary | ICD-10-CM | POA: Diagnosis not present

## 2019-09-29 DIAGNOSIS — Z7982 Long term (current) use of aspirin: Secondary | ICD-10-CM | POA: Insufficient documentation

## 2019-09-29 DIAGNOSIS — F329 Major depressive disorder, single episode, unspecified: Secondary | ICD-10-CM | POA: Insufficient documentation

## 2019-09-29 DIAGNOSIS — J45909 Unspecified asthma, uncomplicated: Secondary | ICD-10-CM | POA: Insufficient documentation

## 2019-09-29 DIAGNOSIS — Z90710 Acquired absence of both cervix and uterus: Secondary | ICD-10-CM | POA: Insufficient documentation

## 2019-09-29 DIAGNOSIS — Z79899 Other long term (current) drug therapy: Secondary | ICD-10-CM | POA: Insufficient documentation

## 2019-09-29 DIAGNOSIS — F419 Anxiety disorder, unspecified: Secondary | ICD-10-CM | POA: Diagnosis not present

## 2019-09-29 DIAGNOSIS — C3411 Malignant neoplasm of upper lobe, right bronchus or lung: Secondary | ICD-10-CM | POA: Insufficient documentation

## 2019-09-29 DIAGNOSIS — I10 Essential (primary) hypertension: Secondary | ICD-10-CM | POA: Insufficient documentation

## 2019-09-29 DIAGNOSIS — E785 Hyperlipidemia, unspecified: Secondary | ICD-10-CM | POA: Insufficient documentation

## 2019-09-29 DIAGNOSIS — C3490 Malignant neoplasm of unspecified part of unspecified bronchus or lung: Secondary | ICD-10-CM

## 2019-09-29 LAB — CMP (CANCER CENTER ONLY)
ALT: 50 U/L — ABNORMAL HIGH (ref 0–44)
AST: 36 U/L (ref 15–41)
Albumin: 4 g/dL (ref 3.5–5.0)
Alkaline Phosphatase: 76 U/L (ref 38–126)
Anion gap: 9 (ref 5–15)
BUN: 10 mg/dL (ref 8–23)
CO2: 27 mmol/L (ref 22–32)
Calcium: 9.3 mg/dL (ref 8.9–10.3)
Chloride: 108 mmol/L (ref 98–111)
Creatinine: 0.85 mg/dL (ref 0.44–1.00)
GFR, Est AFR Am: >60 mL/min (ref >60)
GFR, Estimated: >60 mL/min (ref >60)
Glucose, Bld: 83 mg/dL (ref 70–99)
Potassium: 4 mmol/L (ref 3.5–5.1)
Sodium: 144 mmol/L (ref 135–145)
Total Bilirubin: 0.4 mg/dL (ref 0.3–1.2)
Total Protein: 7.6 g/dL (ref 6.5–8.1)

## 2019-09-29 LAB — CBC WITH DIFFERENTIAL (CANCER CENTER ONLY)
Abs Immature Granulocytes: 0 10*3/uL (ref 0.00–0.07)
Basophils Absolute: 0 10*3/uL (ref 0.0–0.1)
Basophils Relative: 1 %
Eosinophils Absolute: 0.1 10*3/uL (ref 0.0–0.5)
Eosinophils Relative: 2 %
HCT: 38.3 % (ref 36.0–46.0)
Hemoglobin: 12 g/dL (ref 12.0–15.0)
Immature Granulocytes: 0 %
Lymphocytes Relative: 47 %
Lymphs Abs: 1.7 10*3/uL (ref 0.7–4.0)
MCH: 25.8 pg — ABNORMAL LOW (ref 26.0–34.0)
MCHC: 31.3 g/dL (ref 30.0–36.0)
MCV: 82.2 fL (ref 80.0–100.0)
Monocytes Absolute: 0.4 10*3/uL (ref 0.1–1.0)
Monocytes Relative: 10 %
Neutro Abs: 1.5 10*3/uL — ABNORMAL LOW (ref 1.7–7.7)
Neutrophils Relative %: 40 %
Platelet Count: 204 10*3/uL (ref 150–400)
RBC: 4.66 MIL/uL (ref 3.87–5.11)
RDW: 15.6 % — ABNORMAL HIGH (ref 11.5–15.5)
WBC Count: 3.7 10*3/uL — ABNORMAL LOW (ref 4.0–10.5)
nRBC: 0 % (ref 0.0–0.2)

## 2019-09-29 MED ORDER — IOHEXOL 300 MG/ML  SOLN
100.0000 mL | Freq: Once | INTRAMUSCULAR | Status: AC | PRN
  Administered 2019-09-29: 100 mL via INTRAVENOUS

## 2019-09-29 MED ORDER — SODIUM CHLORIDE (PF) 0.9 % IJ SOLN
INTRAMUSCULAR | Status: AC
  Filled 2019-09-29: qty 50

## 2019-09-30 ENCOUNTER — Telehealth: Payer: Self-pay | Admitting: Internal Medicine

## 2019-09-30 ENCOUNTER — Encounter: Payer: Self-pay | Admitting: Internal Medicine

## 2019-09-30 ENCOUNTER — Inpatient Hospital Stay: Payer: Medicare HMO | Admitting: Internal Medicine

## 2019-09-30 ENCOUNTER — Other Ambulatory Visit: Payer: Self-pay

## 2019-09-30 VITALS — BP 139/62 | HR 59 | Temp 98.2°F | Resp 18 | Ht 64.0 in | Wt 133.2 lb

## 2019-09-30 DIAGNOSIS — E785 Hyperlipidemia, unspecified: Secondary | ICD-10-CM | POA: Diagnosis not present

## 2019-09-30 DIAGNOSIS — I10 Essential (primary) hypertension: Secondary | ICD-10-CM | POA: Diagnosis not present

## 2019-09-30 DIAGNOSIS — C3491 Malignant neoplasm of unspecified part of right bronchus or lung: Secondary | ICD-10-CM | POA: Diagnosis not present

## 2019-09-30 DIAGNOSIS — Z90710 Acquired absence of both cervix and uterus: Secondary | ICD-10-CM | POA: Diagnosis not present

## 2019-09-30 DIAGNOSIS — Z5111 Encounter for antineoplastic chemotherapy: Secondary | ICD-10-CM | POA: Diagnosis not present

## 2019-09-30 DIAGNOSIS — J45909 Unspecified asthma, uncomplicated: Secondary | ICD-10-CM | POA: Diagnosis not present

## 2019-09-30 DIAGNOSIS — Z7982 Long term (current) use of aspirin: Secondary | ICD-10-CM | POA: Diagnosis not present

## 2019-09-30 DIAGNOSIS — R69 Illness, unspecified: Secondary | ICD-10-CM | POA: Diagnosis not present

## 2019-09-30 DIAGNOSIS — C3411 Malignant neoplasm of upper lobe, right bronchus or lung: Secondary | ICD-10-CM | POA: Diagnosis not present

## 2019-09-30 DIAGNOSIS — Z79899 Other long term (current) drug therapy: Secondary | ICD-10-CM | POA: Diagnosis not present

## 2019-09-30 NOTE — Progress Notes (Signed)
Received communication back regarding billing issue from [facility] patient back to advise of the update. She was very Patent attorney.  She has my contact name and number for any additional financial questions or concerns.

## 2019-09-30 NOTE — Progress Notes (Signed)
Met with patient by elevators whom was brought over by scheduling with a billing concern showing in her my chart.  Emailed Melina Fiddler F in coding for clarification. Advised patient I would let her know once I receive communication back. She verbalized understanding and has my name and contact number.

## 2019-09-30 NOTE — Progress Notes (Signed)
[place] [phone]   [phone]  OFFICE PROGRESS NOTE  [name], [place] [address]  DIAGNOSIS: Stage IV (T2 a,N2, M1a) non-small cell lung cancer, adenocarcinoma diagnosed in July 2019 and presented with right upper lobe lung mass in addition to mediastinal lymphadenopathy as well as bilateral pulmonary nodules and malignant right pleural effusion.  Biomarker Findings Microsatellite status - Cannot Be Determined Tumor Mutational Burden - Cannot Be Determined Genomic Findings For a complete list of the genes assayed, please refer to the Appendix. EGFR exon 19 deletion ([id number]) TP53 Y220C 7 Disease relevant genes with no reportable alterations: KRAS, ALK, BRAF, MET, RET, ERBB2, ROS1   PRIOR THERAPY: Status post right Pleurx catheter placement by Dr. Prescott Gum for drainage of malignant right pleural effusion.  CURRENT THERAPY: Tagrisso 80 mg p.o. daily.  First dose was given on 01/29/2018.  Status post 20 months of treatment.  INTERVAL HISTORY: Allison [name] 69 y.o. female returns to the clinic today for follow-up visit.  The patient is feeling fine today with no concerning complaints except for insomnia and she currently take melatonin as needed.  She denied having any current chest pain, shortness of breath, cough or hemoptysis.  She denied having any fever or chills.  She has no nausea, vomiting, diarrhea or constipation.  She denied having any headache or visual changes.  She has no recent weight loss or night sweats.  She has been tolerating her treatment with Tagrisso fairly well.  The patient had repeat CT scan of the chest, abdomen pelvis performed recently and she is here for evaluation and discussion of her risk her results.   MEDICAL HISTORY: Past Medical History:  Diagnosis Date  . Anxiety   . Arthritis   . Asthma    exercise induced  . Depression    PMH  . GERD  (gastroesophageal reflux disease)   . Glaucoma   . Hypertension   . lung ca dx'd 11/2017   right  . Malignant pleural effusion    right  . PONV (postoperative nausea and vomiting)   . Pre-diabetes   . Raynaud's disease   . Raynaud's disease     ALLERGIES:  is allergic to penicillins and vicodin [hydrocodone-acetaminophen].  MEDICATIONS:  Current Outpatient Medications  Medication Sig Dispense Refill  . acetaminophen (TYLENOL) 500 MG tablet Take 1,000 mg by mouth every 6 (six) hours as needed (pain).     Marland Kitchen ALPRAZolam (XANAX) 0.25 MG tablet Take 0.25 mg by mouth 2 (two) times daily as needed for anxiety.     Marland Kitchen amLODipine (NORVASC) 5 MG tablet Take 5 mg by mouth daily.    Marland Kitchen aspirin 81 MG chewable tablet Chew 1 tablet (81 mg total) by mouth daily.    Marland Kitchen atorvastatin (LIPITOR) 20 MG tablet HOLD FOR NOW 30 tablet 3  . Biotin 1000 MCG CHEW Chew 10 mg by mouth daily.    . carvedilol (COREG) 6.25 MG tablet Take 1 tablet (6.25 mg total) by mouth 2 (two) times daily with a meal. 180 tablet 3  . clopidogrel (PLAVIX) 75 MG tablet Take 1 tablet (75 mg total) by mouth daily with breakfast. 90 tablet 3  . dorzolamide-timolol (COSOPT) 22.3-6.8 MG/ML ophthalmic solution Instill 1 drop in both eyes twice daily    . ezetimibe (ZETIA) 10 MG tablet Take 10 mg by mouth daily.     Marland Kitchen latanoprost (XALATAN) 0.005 % ophthalmic solution Place 1 drop  into both eyes at bedtime.     . Melatonin 5 MG CAPS Take 10 mg by mouth at bedtime as needed (sleep).    . nitroGLYCERIN (NITROSTAT) 0.4 MG SL tablet Place 1 tablet (0.4 mg total) under the tongue every 5 (five) minutes x 3 doses as needed for chest pain. 30 tablet 12  . osimertinib mesylate (TAGRISSO) 80 MG tablet Take 1 tablet (80 mg total) by mouth daily. 30 tablet 3  . VITAMIN D, CHOLECALCIFEROL, PO Take 5,000 Units by mouth daily.      No current facility-administered medications for this visit.    SURGICAL HISTORY:  Past Surgical History:  Procedure  Laterality Date  . ABDOMINAL HYSTERECTOMY     partial  . CHEST TUBE INSERTION Right 01/01/2018   Procedure: INSERTION PLEURAL DRAINAGE CATHETER;  Surgeon: Ivin Poot, MD;  Location: [facility];  Service: Thoracic;  Laterality: Right;  . COLONOSCOPY    . CORONARY STENT INTERVENTION N/A 06/16/2019   Procedure: CORONARY STENT INTERVENTION;  Surgeon: Jettie Booze, MD;  Location: [facility];  Service: Cardiovascular;  Laterality: N/A;  . DILATION AND CURETTAGE OF UTERUS    . EYE SURGERY     due to Glaucoma  . LEFT HEART CATH AND CORONARY ANGIOGRAPHY N/A 06/16/2019   Procedure: LEFT HEART CATH AND CORONARY ANGIOGRAPHY;  Surgeon: Jettie Booze, MD;  Location: [facility];  Service: Cardiovascular;  Laterality: N/A;  . REMOVAL OF PLEURAL DRAINAGE CATHETER Right 11/07/2018   Procedure: REMOVAL OF PLEURAL DRAINAGE CATHETER;  Surgeon: Ivin Poot, MD;  Location: [facility];  Service: Thoracic;  Laterality: Right;  . ROTATOR CUFF REPAIR    . TUBAL LIGATION    . WISDOM TOOTH EXTRACTION      REVIEW OF SYSTEMS:  Constitutional: negative Eyes: negative Ears, nose, mouth, throat, and face: negative Respiratory: negative Cardiovascular: negative Gastrointestinal: negative Genitourinary:negative Integument/breast: negative Hematologic/lymphatic: negative Musculoskeletal:negative Neurological: negative Behavioral/Psych: positive for sleep disturbance Endocrine: negative Allergic/Immunologic: negative   PHYSICAL EXAMINATION: General appearance: alert, cooperative and no distress Head: Normocephalic, without obvious abnormality, atraumatic Neck: no adenopathy, no JVD, supple, symmetrical, trachea midline and thyroid not enlarged, symmetric, no tenderness/mass/nodules Lymph nodes: Cervical, supraclavicular, and axillary nodes normal. Resp: clear to auscultation bilaterally Back: symmetric, no curvature. ROM normal. No CVA tenderness. Cardio: regular rate and rhythm, S1,  S2 normal, no murmur, click, rub or gallop GI: soft, non-tender; bowel sounds normal; no masses,  no organomegaly Extremities: extremities normal, atraumatic, no cyanosis or edema Neurologic: Alert and oriented X 3, normal strength and tone. Normal symmetric reflexes. Normal coordination and gait  ECOG PERFORMANCE STATUS: 0 - Asymptomatic  Blood pressure 139/62, pulse (!) 59, temperature 98.2 F (36.8 C), temperature source Temporal, resp. rate 18, height 5' 4" (1.626 m), weight 133 lb 3.2 oz (60.4 kg), SpO2 100 %.  LABORATORY DATA: Lab Results  Component Value Date   WBC 3.7 (L) 09/29/2019   HGB 12.0 09/29/2019   HCT 38.3 09/29/2019   MCV 82.2 09/29/2019   PLT 204 09/29/2019      Chemistry      Component Value Date/Time   NA 144 09/29/2019 1039   K 4.0 09/29/2019 1039   CL 108 09/29/2019 1039   CO2 27 09/29/2019 1039   BUN 10 09/29/2019 1039   CREATININE 0.85 09/29/2019 1039      Component Value Date/Time   CALCIUM 9.3 09/29/2019 1039   ALKPHOS 76 09/29/2019 1039   AST 36 09/29/2019 1039  ALT 50 (H) 09/29/2019 1039   BILITOT 0.4 09/29/2019 1039       RADIOGRAPHIC STUDIES: CT Chest W Contrast  Result Date: 09/29/2019 CLINICAL DATA:  Non-small cell lung cancer, restaging. Right-sided chest pain and night sweats. EXAM: CT CHEST, ABDOMEN, AND PELVIS WITH CONTRAST TECHNIQUE: Multidetector CT imaging of the chest, abdomen and pelvis was performed following the standard protocol during bolus administration of intravenous contrast. CONTRAST:  160m OMNIPAQUE IOHEXOL 300 MG/ML  SOLN COMPARISON:  CT chest 06/12/2019 and CT chest abdomen pelvis 05/02/2019. FINDINGS: CT CHEST FINDINGS Cardiovascular: Atherosclerotic calcification of the aorta and coronary arteries. Heart size normal. No pericardial effusion. Mediastinum/Nodes: Subcentimeter low-attenuation lesions in the thyroid. No follow-up recommended (ref: J Am Coll Radiol. 2015 Feb;12(2): 143-50).No pathologically enlarged  mediastinal, hilar or axillary lymph nodes. Esophagus is unremarkable. Lungs/Pleura: Irregular nodular consolidation in the apical segment right upper lobe measures 1.7 x 2.6 cm, stable. 5 x 8 mm nodule in the anterior right lower lobe (6/92), stable. Calcified granuloma in the anterior left lower lobe. Trace right pleural effusion, similar. No left pleural fluid. Airway is unremarkable. Musculoskeletal: No worrisome lytic or sclerotic lesions. CT ABDOMEN PELVIS FINDINGS Hepatobiliary: Millimetric low-attenuation lesion in the periphery of the right hepatic lobe (2/53), too small to characterize. Liver and gallbladder are otherwise unremarkable. No biliary ductal dilatation. Pancreas: Negative. Spleen: Negative. Adrenals/Urinary Tract: Adrenal glands are unremarkable. Subcentimeter low-attenuation lesions in the kidneys are too small to characterize but statistically, cysts are likely. Kidneys are otherwise unremarkable. Ureters are decompressed. Bladder is grossly unremarkable. Stomach/Bowel: Stomach and small bowel are unremarkable. Appendix is not readily visualized. Stool is seen throughout the colon, indicative of constipation. Vascular/Lymphatic: Atherosclerotic calcification of the aorta without aneurysm. No pathologically enlarged lymph nodes. Reproductive: Hysterectomy.  No adnexal mass. Other: No free fluid.  Mesenteries and peritoneum are unremarkable. Musculoskeletal: No worrisome lytic or sclerotic lesions. Degenerative changes in the spine. Minimal grade 1 anterolisthesis of L5 on S1. IMPRESSION: 1. Stable right upper and right lower lobe nodules. 2. Trace right pleural effusion, stable. 3. Aortic atherosclerosis (ICD10-I70.0). Coronary artery calcification. Electronically Signed   By: MLorin PicketM.D.   On: 09/29/2019 13:51   CT Abdomen Pelvis W Contrast  Result Date: 09/29/2019 CLINICAL DATA:  Non-small cell lung cancer, restaging. Right-sided chest pain and night sweats. EXAM: CT CHEST,  ABDOMEN, AND PELVIS WITH CONTRAST TECHNIQUE: Multidetector CT imaging of the chest, abdomen and pelvis was performed following the standard protocol during bolus administration of intravenous contrast. CONTRAST:  1025mOMNIPAQUE IOHEXOL 300 MG/ML  SOLN COMPARISON:  CT chest 06/12/2019 and CT chest abdomen pelvis 05/02/2019. FINDINGS: CT CHEST FINDINGS Cardiovascular: Atherosclerotic calcification of the aorta and coronary arteries. Heart size normal. No pericardial effusion. Mediastinum/Nodes: Subcentimeter low-attenuation lesions in the thyroid. No follow-up recommended (ref: J Am Coll Radiol. 2015 Feb;12(2): 143-50).No pathologically enlarged mediastinal, hilar or axillary lymph nodes. Esophagus is unremarkable. Lungs/Pleura: Irregular nodular consolidation in the apical segment right upper lobe measures 1.7 x 2.6 cm, stable. 5 x 8 mm nodule in the anterior right lower lobe (6/92), stable. Calcified granuloma in the anterior left lower lobe. Trace right pleural effusion, similar. No left pleural fluid. Airway is unremarkable. Musculoskeletal: No worrisome lytic or sclerotic lesions. CT ABDOMEN PELVIS FINDINGS Hepatobiliary: Millimetric low-attenuation lesion in the periphery of the right hepatic lobe (2/53), too small to characterize. Liver and gallbladder are otherwise unremarkable. No biliary ductal dilatation. Pancreas: Negative. Spleen: Negative. Adrenals/Urinary Tract: Adrenal glands are unremarkable. Subcentimeter low-attenuation lesions in the kidneys  are too small to characterize but statistically, cysts are likely. Kidneys are otherwise unremarkable. Ureters are decompressed. Bladder is grossly unremarkable. Stomach/Bowel: Stomach and small bowel are unremarkable. Appendix is not readily visualized. Stool is seen throughout the colon, indicative of constipation. Vascular/Lymphatic: Atherosclerotic calcification of the aorta without aneurysm. No pathologically enlarged lymph nodes. Reproductive:  Hysterectomy.  No adnexal mass. Other: No free fluid.  Mesenteries and peritoneum are unremarkable. Musculoskeletal: No worrisome lytic or sclerotic lesions. Degenerative changes in the spine. Minimal grade 1 anterolisthesis of L5 on S1. IMPRESSION: 1. Stable right upper and right lower lobe nodules. 2. Trace right pleural effusion, stable. 3. Aortic atherosclerosis (ICD10-I70.0). Coronary artery calcification. Electronically Signed   By: Lorin Picket M.D.   On: 09/29/2019 13:51    ASSESSMENT AND PLAN: This is a very pleasant 69 years old never smoker African-American female recently with a stage IV non-small cell lung cancer, adenocarcinoma with positive EGFR mutation with deletion in exon 19. The patient was started on treatment with Tagrisso 80 mg p.o. daily status post 20 months of treatment. The patient has been tolerating this treatment well with no concerning adverse effects. She had repeat CT scan of the chest, abdomen pelvis performed recently.  I personally and independently reviewed the scans and discussed the results with the patient today. Her scan showed no concerning findings for disease progression. I recommended for the patient to continue her current treatment with Tagrisso with the same dose. For the hypertension and dyslipidemia, she will continue her current treatment as prescribed by her primary care physician. The patient will come back for follow-up visit in 2 months for evaluation and repeat blood work. She was advised to call immediately if she has any concerning symptoms in the interval.  The patient voices understanding of current disease status and treatment options and is in agreement with the current care plan. All questions were answered. The patient knows to call the clinic with any problems, questions or concerns. We can certainly see the patient much sooner if necessary.  Disclaimer: This note was dictated with voice recognition software. Similar sounding words  can inadvertently be transcribed and may not be corrected upon review.

## 2019-09-30 NOTE — Telephone Encounter (Signed)
Scheduled appt per 4/13 los.  Pt declined calendar and avs.

## 2019-10-06 ENCOUNTER — Encounter: Payer: Self-pay | Admitting: *Deleted

## 2019-10-06 NOTE — Progress Notes (Signed)
Patient come to complete HIPAA form.  Gave this to [employer] to update marketing.

## 2019-10-07 DIAGNOSIS — E785 Hyperlipidemia, unspecified: Secondary | ICD-10-CM | POA: Diagnosis not present

## 2019-10-07 DIAGNOSIS — C3491 Malignant neoplasm of unspecified part of right bronchus or lung: Secondary | ICD-10-CM | POA: Diagnosis not present

## 2019-10-07 DIAGNOSIS — I10 Essential (primary) hypertension: Secondary | ICD-10-CM | POA: Diagnosis not present

## 2019-10-08 ENCOUNTER — Encounter: Payer: Medicare HMO | Admitting: Cardiothoracic Surgery

## 2019-10-09 ENCOUNTER — Encounter: Payer: Self-pay | Admitting: *Deleted

## 2019-10-09 NOTE — Progress Notes (Signed)
Legal authorized me that I can give name and phone number to interviewing company.  Allison [name] completed HIPAA forms.

## 2019-10-10 ENCOUNTER — Encounter (HOSPITAL_COMMUNITY)
Admission: RE | Admit: 2019-10-10 | Discharge: 2019-10-10 | Disposition: A | Discharge status: Home / Self Care | Payer: Medicare HMO | Source: Ambulatory Visit | Attending: Diagnostic Radiology | Admitting: Diagnostic Radiology

## 2019-10-10 ENCOUNTER — Other Ambulatory Visit: Payer: Self-pay

## 2019-10-10 NOTE — Progress Notes (Signed)
Nutrition Note  Spoke to pt over the phone for virtual cardiac rehab. She is continuing to log her exercise on better [employer] app. She feels it has been really helpful for accountability.  We reviewed her nutrition goals. She reports less constipation. When she feels constipated, she drinks more water and increases physical activity and exercise to resolve it. She reports concern due to elevated liver enzymes. She reports PCP taking her off one of her statins. Allison [name] is concerned about her diet and exercise. She feels fearful of having another heart attack. We discussed stress management, maintaining her current exercise routine, and continuing to eat a heart healthy diet. She does a great job getting adequate veggies and whole grains. She is trying to limit eating out to twice per week. She orders all her foods without salt. She is aware of sodium found in foods. She reports better appetite and feels confident managing her appetite when it is poor.   Will continue to monitor pt with phone calls for virtual cardiac rehab as long as she remains on the [employer] App and opts into phone calls.  Michaele Offer, MS, RDN, LDN

## 2019-10-13 ENCOUNTER — Telehealth (HOSPITAL_COMMUNITY): Payer: Self-pay

## 2019-10-13 NOTE — Telephone Encounter (Signed)
Virtual Cardiac Rehab Note:  Successful telephone encounter to [facility]. Allison [name] to schedule a face to face follow up assessment and 6 min walk test post graduation from the [facility] [employer] program 10/18/19. Allison [name] agrees to appointment 10/21/19 at 0730. Homework mailed and patient instructed to complete and return at this appointment. Patient verbalized understanding.  Allison Lashomb E. Rollene Rotunda RN, BSN . [facility]  Cardiac and Pulmonary Rehabilitation Phone: [phone] Fax: [phone]

## 2019-10-21 ENCOUNTER — Encounter (HOSPITAL_COMMUNITY): Payer: Self-pay

## 2019-10-21 ENCOUNTER — Other Ambulatory Visit: Payer: Self-pay

## 2019-10-21 ENCOUNTER — Encounter (HOSPITAL_COMMUNITY)
Admission: RE | Admit: 2019-10-21 | Discharge: 2019-10-21 | Disposition: A | Discharge status: Home / Self Care | Payer: Medicare HMO | Source: Ambulatory Visit | Attending: Cardiology | Admitting: Cardiology

## 2019-10-21 VITALS — BP 112/56 | Temp 96.4°F | Resp 18 | Ht 64.75 in | Wt 132.3 lb

## 2019-10-21 DIAGNOSIS — Z955 Presence of coronary angioplasty implant and graft: Secondary | ICD-10-CM

## 2019-10-21 DIAGNOSIS — I214 Non-ST elevation (NSTEMI) myocardial infarction: Secondary | ICD-10-CM | POA: Insufficient documentation

## 2019-10-21 NOTE — Progress Notes (Signed)
Discharge Progress Report  Patient Details  Name: Allison [name] MRN: [id number] Date of Birth: [birth year] Referring Provider:     CARDIAC REHAB PHASE II ORIENTATION from 07/22/2019 in [place]  Referring Provider  Beatrix Fetters, MD       Number of Visits: 07-21-19 to 10-22-19 virtual CR  Reason for Discharge:  Patient reached a stable level of exercise. Patient independent in their exercise. Patient has met program and personal goals.  Smoking History:  Social History   Tobacco Use  Smoking Status Never Smoker  Smokeless Tobacco Never Used    Diagnosis:  NSTEMI (non-ST elevated myocardial infarction) ([place])  Status post coronary artery stent placement  ADL [facility]:   Initial Exercise Prescription: Initial Exercise Prescription - 07/22/19 1400      Date of Initial Exercise RX and Referring Provider   Date  07/22/19    Referring Provider  Beatrix Fetters, MD      Bike   Level  2    Minutes  15    METs  2.5      NuStep   Level  3    SPM  85    Minutes  15    METs  2.5      Prescription Details   Frequency (times per week)  3    Duration  Progress to 30 minutes of continuous aerobic without signs/symptoms of physical distress      Intensity   THRR 40-80% of Max Heartrate  61-122    Ratings of Perceived Exertion  11-13    Perceived Dyspnea  0-4      Progression   Progression  Continue to progress workloads to maintain intensity without signs/symptoms of physical distress.      Resistance Training   Training Prescription  Yes    Weight  5lbs    Reps  10-15       Discharge Exercise Prescription (Final Exercise Prescription Changes):   Functional Capacity: 6 Minute Walk    Row Name 07/22/19 1010 10/21/19 0746       6 Minute Walk   Phase  Initial  Discharge    Distance  1607 feet  1738 feet    Distance % Change  --  8.15 %    Distance Feet Change  --  131 ft    Walk Time  6 minutes  6 minutes    # of Rest  Breaks  0  0    MPH  3.04  3.29    METS  3.6  3.94    RPE  12  9    Perceived Dyspnea   0  0    VO2 Peak  12.58  13.78    Symptoms  Yes (comment)  No    Comments  Low back discomfort, chronic.  --    Resting HR  64 bpm  66 bpm    Resting BP  112/52  112/56    Resting Oxygen Saturation   100 %  100 %    Exercise Oxygen Saturation  during 6 min walk  100 %  100 %    Max Ex. HR  84 bpm  87 bpm    Max Ex. BP  126/70  138/58    2 Minute Post BP  138/64  112/60       Psychological, QOL, Others - Outcomes: PHQ 2/9: Depression screen [facility] 2/9 10/21/2019 09/17/2019 08/20/2019 07/22/2019 06/24/2018  Decreased Interest 0 0 1 0 0  Down, Depressed, Hopeless 0 0 1 0 0  PHQ - 2 Score 0 0 2 0 0  Altered sleeping - 0 0 - -  Tired, decreased energy - 0 0 - -  Change in appetite - 0 0 - -  Feeling bad or failure about yourself  - 0 0 - -  Trouble concentrating - 0 0 - -  Moving slowly or fidgety/restless - 0 0 - -  Suicidal thoughts - 0 0 - -  PHQ-9 Score - 0 2 - -    Quality of Life: Quality of Life - 11/03/19 1148      Quality of Life   Select  Quality of Life      Quality of Life Scores   Health/Function Pre  17.1 %    Health/Function Post  21.53 %    Health/Function % Change  25.91 %    Socioeconomic Pre  23.57 %    Socioeconomic Post  21.5 %    Socioeconomic % Change   -8.78 %    Psych/Spiritual Pre  20.57 %    Psych/Spiritual Post  26.57 %    Psych/Spiritual % Change  29.17 %    Family Pre  25.3 %    Family Post  26.3 %    Family % Change  3.95 %    GLOBAL Pre  20.35 %    GLOBAL Post  23.26 %    GLOBAL % Change  14.3 %       Personal Goals: Goals established at orientation with interventions provided to work toward goal. Personal Goals and Risk Factors at Admission - 07/22/19 1404      Core Components/Risk Factors/Patient Goals on Admission    Weight Management  Weight Gain;Yes    Intervention  Weight Management: Develop a combined nutrition and exercise program designed to  reach desired caloric intake, while maintaining appropriate intake of nutrient and fiber, sodium and fats, and appropriate energy expenditure required for the weight goal.;Weight Management: Provide education and appropriate resources to help participant work on and attain dietary goals.    Admit Weight  133 lb 2.5 oz (60.4 kg)    Expected Outcomes  Short Term: Continue to assess and modify interventions until short term weight is achieved;Long Term: Adherence to nutrition and physical activity/exercise program aimed toward attainment of established weight goal;Weight Maintenance: Understanding of the daily nutrition guidelines, which includes 25-35% calories from fat, 7% or less cal from saturated fats, less than 267m cholesterol, less than 1.5gm of sodium, & 5 or more servings of fruits and vegetables daily;Understanding recommendations for meals to include 15-35% energy as protein, 25-35% energy from fat, 35-60% energy from carbohydrates, less than 2061mof dietary cholesterol, 20-35 gm of total fiber daily;Understanding of distribution of calorie intake throughout the day with the consumption of 4-5 meals/snacks;Weight Gain: Understanding of general recommendations for a high calorie, high protein meal plan that promotes weight gain by distributing calorie intake throughout the day with the consumption for 4-5 meals, snacks, and/or supplements    Hypertension  Yes    Intervention  Provide education on lifestyle modifcations including regular physical activity/exercise, weight management, moderate sodium restriction and increased consumption of fresh fruit, vegetables, and low fat dairy, alcohol moderation, and smoking cessation.;Monitor prescription use compliance.    Expected Outcomes  Short Term: Continued assessment and intervention until BP is < 140/9065mG in hypertensive participants. < 130/87m70m in hypertensive participants with diabetes, heart failure or chronic kidney disease.;Long Term:  Maintenance  of blood pressure at goal levels.    Lipids  Yes    Intervention  Provide education and support for participant on nutrition & aerobic/resistive exercise along with prescribed medications to achieve LDL <39m, HDL >420m    Expected Outcomes  Short Term: Participant states understanding of desired cholesterol values and is compliant with medications prescribed. Participant is following exercise prescription and nutrition guidelines.;Long Term: Cholesterol controlled with medications as prescribed, with individualized exercise RX and with personalized nutrition plan. Value goals: LDL < 7024mHDL > 40 mg.    Stress  Yes    Expected Outcomes  Short Term: Participant demonstrates changes in health-related behavior, relaxation and other stress management skills, ability to obtain effective social support, and compliance with psychotropic medications if prescribed.;Long Term: Emotional wellbeing is indicated by absence of clinically significant psychosocial distress or social isolation.        Personal Goals Discharge: Goals and Risk Factor Review    Row Name 10/21/19 0917             Core Components/Risk Factors/Patient Goals Review   Personal Goals Review  Weight Management/Obesity;Lipids;Stress;Hypertension       Review  PauDeneces multiple CAD risk factors however she is utilizing exercise and education she recieved through the [facility] program to modify those risk factors. She continues to take all medications as prescribed and exercises on a daily basis. She is eating a heart healthy diet.       Expected Outcomes  Ms. AdaDurioll continue her cardiac rehab plan of care and exercise program post discharge.          Exercise Goals and Review: Exercise Goals    Row Name 07/22/19 0945             Exercise Goals   Increase Physical Activity  Yes       Intervention  Provide advice, education, support and counseling about physical activity/exercise needs.;Develop an individualized  exercise prescription for aerobic and resistive training based on initial evaluation findings, risk stratification, comorbidities and participant's personal goals.       Expected Outcomes  Short Term: Attend rehab on a regular basis to increase amount of physical activity.;Long Term: Exercising regularly at least 3-5 days a week.;Long Term: Add in home exercise to make exercise part of routine and to increase amount of physical activity.       Increase Strength and Stamina  Yes       Intervention  Provide advice, education, support and counseling about physical activity/exercise needs.;Develop an individualized exercise prescription for aerobic and resistive training based on initial evaluation findings, risk stratification, comorbidities and participant's personal goals.       Expected Outcomes  Short Term: Increase workloads from initial exercise prescription for resistance, speed, and METs.;Short Term: Perform resistance training exercises routinely during rehab and add in resistance training at home;Long Term: Improve cardiorespiratory fitness, muscular endurance and strength as measured by increased METs and functional capacity (6MWT)       Able to understand and use rate of perceived exertion (RPE) scale  Yes       Intervention  Provide education and explanation on how to use RPE scale       Expected Outcomes  Short Term: Able to use RPE daily in rehab to express subjective intensity level;Long Term:  Able to use RPE to guide intensity level when exercising independently       Knowledge and understanding of Target Heart Rate Range (THRR)  Yes  Intervention  Provide education and explanation of THRR including how the numbers were predicted and where they are located for reference       Expected Outcomes  Short Term: Able to state/look up THRR;Long Term: Able to use THRR to govern intensity when exercising independently;Short Term: Able to use daily as guideline for intensity in rehab       Able  to check pulse independently  Yes       Intervention  Provide education and demonstration on how to check pulse in carotid and radial arteries.;Review the importance of being able to check your own pulse for safety during independent exercise       Expected Outcomes  Short Term: Able to explain why pulse checking is important during independent exercise;Long Term: Able to check pulse independently and accurately       Understanding of Exercise Prescription  Yes       Intervention  Provide education, explanation, and written materials on patient's individual exercise prescription       Expected Outcomes  Short Term: Able to explain program exercise prescription;Long Term: Able to explain home exercise prescription to exercise independently          Exercise Goals Re-Evaluation: Exercise Goals Re-Evaluation    Row Name 10/21/19 0801             Exercise Goal Re-Evaluation   Exercise Goals Review  Increase Strength and Stamina;Able to understand and use rate of perceived exertion (RPE) scale;Able to check pulse independently;Increase Physical Activity;Knowledge and understanding of Target Heart Rate Range (THRR);Understanding of Exercise Prescription       Comments  Patient completed the virtual cardiac rehab program and progressed well. Functional capacity increased 8% as measured by 6MWT and strength increased 29% as measured by grip strength test. [facility] and will continue that routine at this time.       Expected Outcomes  Patient will exercise at least 30 minutes, 4-5 days/week to maintain health and fitness gains.          Nutrition & Weight - Outcomes: Pre Biometrics - 07/22/19 0945      Pre Biometrics   Waist Circumference  32.5 inches    Hip Circumference  37 inches    Waist to Hip Ratio  0.88 %    Triceps Skinfold  26 mm    % Body Fat  34.9 %    Grip Strength  24 kg    Flexibility  15 in    Single Leg Stand  30 seconds       Post Biometrics - 10/21/19 0745       Post  Biometrics   Height  5' 4.75" (1.645 m)    Waist Circumference  31 inches    Hip Circumference  36.75 inches    Waist to Hip Ratio  0.84 %    BMI (Calculated)  22.17    Triceps Skinfold  24 mm    % Body Fat  33.9 %    Grip Strength  31 kg    Flexibility  14 in    Single Leg Stand  30 seconds       Nutrition: Nutrition Therapy & Goals - 07/29/19 1434      Nutrition Therapy   Diet  Heart Healthy    Drug/Food Interactions  Statins/Certain Fruits      Personal Nutrition Goals   Nutrition Goal  Pt to build a healthy plate including vegetables, fruits,  whole grains, and low-fat dairy products in a heart healthy meal plan.      Intervention Plan   Intervention  Prescribe, educate and counsel regarding individualized specific dietary modifications aiming towards targeted core components such as weight, hypertension, lipid management, diabetes, heart failure and other comorbidities.;Nutrition handout(s) given to patient.    Expected Outcomes  Short Term Goal: A plan has been developed with personal nutrition goals set during dietitian appointment.;Long Term Goal: Adherence to prescribed nutrition plan.       Nutrition Discharge: Nutrition Assessments - 11/06/19 0931      MEDFICTS Scores   Post Score  22       Education Questionnaire Score: Knowledge Questionnaire Score - 11/03/19 1149      Knowledge Questionnaire Score   Pre Score  21/24    Post Score  23/24       Goals reviewed with patient; copy given to patient.

## 2019-11-05 DIAGNOSIS — E785 Hyperlipidemia, unspecified: Secondary | ICD-10-CM | POA: Diagnosis not present

## 2019-11-11 ENCOUNTER — Other Ambulatory Visit: Payer: Self-pay | Admitting: Cardiothoracic Surgery

## 2019-11-11 DIAGNOSIS — J90 Pleural effusion, not elsewhere classified: Secondary | ICD-10-CM

## 2019-11-12 ENCOUNTER — Encounter: Payer: Self-pay | Admitting: Cardiothoracic Surgery

## 2019-11-12 ENCOUNTER — Ambulatory Visit: Payer: Medicare HMO | Admitting: Cardiothoracic Surgery

## 2019-11-12 ENCOUNTER — Ambulatory Visit
Admission: RE | Admit: 2019-11-12 | Discharge: 2019-11-12 | Disposition: A | Discharge status: Home / Self Care | Payer: Medicare HMO | Source: Ambulatory Visit | Attending: Cardiothoracic Surgery | Admitting: Cardiothoracic Surgery

## 2019-11-12 ENCOUNTER — Other Ambulatory Visit: Payer: Self-pay

## 2019-11-12 VITALS — BP 153/74 | HR 58 | Temp 97.9°F | Resp 16 | Ht 64.0 in | Wt 133.4 lb

## 2019-11-12 DIAGNOSIS — C3411 Malignant neoplasm of upper lobe, right bronchus or lung: Secondary | ICD-10-CM | POA: Diagnosis not present

## 2019-11-12 DIAGNOSIS — C3490 Malignant neoplasm of unspecified part of unspecified bronchus or lung: Secondary | ICD-10-CM | POA: Diagnosis not present

## 2019-11-12 DIAGNOSIS — J910 Malignant pleural effusion: Secondary | ICD-10-CM | POA: Diagnosis not present

## 2019-11-12 DIAGNOSIS — Z9889 Other specified postprocedural states: Secondary | ICD-10-CM

## 2019-11-12 DIAGNOSIS — J90 Pleural effusion, not elsewhere classified: Secondary | ICD-10-CM | POA: Diagnosis not present

## 2019-11-12 NOTE — Progress Notes (Signed)
PCP is [name], MD Referring Provider is [name], MD  Chief Complaint  Patient presents with  . Routine Post Op    3 month f/u with a CXR    HPI: 76-month follow-up with chest x-ray.  Patient had history of recurrent right malignant effusion but the catheter was removed several months ago after chemotherapy dried up the effusion.  She continues to take daily immunotherapy orally and maintains an excellent functional status.  Reviewed her chest x-ray taken today which shows no evidence of recurrent right pleural effusion.  There is a small nodular density which is stable in the right apex.   Past Medical History:  Diagnosis Date  . Anxiety   . Arthritis   . Asthma    exercise induced  . Depression    PMH  . GERD (gastroesophageal reflux disease)   . Glaucoma   . Hypertension   . lung ca dx'd 11/2017   right  . Malignant pleural effusion    right  . PONV (postoperative nausea and vomiting)   . Pre-diabetes   . Raynaud's disease   . Raynaud's disease     Past Surgical History:  Procedure Laterality Date  . ABDOMINAL HYSTERECTOMY     partial  . CHEST TUBE INSERTION Right 01/01/2018   Procedure: INSERTION PLEURAL DRAINAGE CATHETER;  Surgeon: Ivin Poot, MD;  Location: [place];  Service: Thoracic;  Laterality: Right;  . COLONOSCOPY    . CORONARY STENT INTERVENTION N/A 06/16/2019   Procedure: CORONARY STENT INTERVENTION;  Surgeon: Jettie Booze, MD;  Location: [place] [facility];  Service: Cardiovascular;  Laterality: N/A;  . DILATION AND CURETTAGE OF UTERUS    . EYE SURGERY     due to Glaucoma  . LEFT HEART CATH AND CORONARY ANGIOGRAPHY N/A 06/16/2019   Procedure: LEFT HEART CATH AND CORONARY ANGIOGRAPHY;  Surgeon: Jettie Booze, MD;  Location: [place] [facility];  Service: Cardiovascular;  Laterality: N/A;  . REMOVAL OF PLEURAL DRAINAGE CATHETER Right 11/07/2018   Procedure: REMOVAL OF PLEURAL DRAINAGE CATHETER;  Surgeon: Ivin Poot, MD;  Location: [place];  Service: Thoracic;  Laterality: Right;  . ROTATOR CUFF REPAIR    . TUBAL LIGATION    . WISDOM TOOTH EXTRACTION      Family History  Problem Relation Age of Onset  . Heart disease Sister   . Heart disease Brother   . Lung cancer Other     Social History Social History   Tobacco Use  . Smoking status: Never Smoker  . Smokeless tobacco: Never Used  Substance Use Topics  . Alcohol use: Yes    Comment: up to 3 drinks per week  . Drug use: No    Comment: CBD oil     Current Outpatient Medications  Medication Sig Dispense Refill  . acetaminophen (TYLENOL) 500 MG tablet Take 1,000 mg by mouth every 6 (six) hours as needed (pain).     . Acetylcysteine (N-ACETYL-L-CYSTEINE PO) Take 1 tablet by mouth 2 (two) times daily.    Marland Kitchen ALPRAZolam (XANAX) 0.25 MG tablet Take 0.25 mg by mouth 2 (two) times daily as needed for anxiety.     Marland Kitchen amLODipine (NORVASC) 5 MG tablet Take 5 mg by mouth daily.    Marland Kitchen aspirin 81 MG chewable tablet Chew 1 tablet (81 mg total) by mouth daily.    . Biotin 1000 MCG CHEW Chew 10 mg by mouth daily.    . carvedilol (COREG) 6.25 MG tablet Take  1 tablet (6.25 mg total) by mouth 2 (two) times daily with a meal. 180 tablet 3  . clopidogrel (PLAVIX) 75 MG tablet Take 1 tablet (75 mg total) by mouth daily with breakfast. 90 tablet 3  . dorzolamide-timolol (COSOPT) 22.3-6.8 MG/ML ophthalmic solution Instill 1 drop in both eyes twice daily    . ezetimibe (ZETIA) 10 MG tablet Take 10 mg by mouth daily.     Marland Kitchen latanoprost (XALATAN) 0.005 % ophthalmic solution Place 1 drop into both eyes at bedtime.     . Melatonin 5 MG CAPS Take 10 mg by mouth at bedtime as needed (sleep).    . nitroGLYCERIN (NITROSTAT) 0.4 MG SL tablet Place 1 tablet (0.4 mg total) under the tongue every 5 (five) minutes x 3 doses as needed for chest pain. 30 tablet 12  . osimertinib mesylate (TAGRISSO) 80 MG tablet Take 1 tablet (80 mg total) by mouth daily. 30 tablet 3  . traMADol  (ULTRAM) 50 MG tablet Take 50 mg by mouth 2 (two) times daily as needed.    Marland Kitchen VITAMIN D, CHOLECALCIFEROL, PO Take 5,000 Units by mouth daily.      No current facility-administered medications for this visit.    Allergies  Allergen Reactions  . Penicillins Other (See Comments)    SYNCOPE PATIENT HAS HAD A PCN REACTION WITH IMMEDIATE RASH, FACIAL/TONGUE/THROAT SWELLING, SOB, OR LIGHTHEADEDNESS WITH HYPOTENSION:  #  #  YES  #  # Has patient had a PCN reaction causing severe rash involving mucus membranes or skin necrosis: No Has patient had a PCN reaction that required hospitalization: No Has patient had a PCN reaction occurring within the last 10 years: No   . Vicodin [Hydrocodone-Acetaminophen] Other (See Comments)    Sped up heart and breathing    Review of Systems  Weight stable Activity stable No chest pain No shortness of breath Remains very active, riding bike  BP (!) 153/74 (BP Location: Right Arm, Patient Position: Sitting, Cuff Size: Normal)   Pulse (!) 58   Temp 97.9 F (36.6 C)   Resp 16   Ht 5\' 4"  (1.626 m)   Wt 133 lb 6.4 oz (60.5 kg)   LMP  (LMP Unknown)   SpO2 98% Comment: ON RA  BMI 22.90 kg/m  Physical Exam      Exam    General- alert and comfortable    Neck- no JVD, no cervical adenopathy palpable, no carotid bruit   Lungs- clear without rales, wheezes   Cor- regular rate and rhythm, no murmur , gallop   Abdomen- soft, non-tender   Extremities - warm, non-tender, minimal edema   Neuro- oriented, appropriate, no focal weakness   Diagnostic Tests: Today's chest x-ray personally viewed and discussed with patient showing no recurrent pleural effusion  Impression: Patient doing well after removal of right Pleurx catheter without evidence of recurrence  Plan: Return for follow-up chest x-ray in 3 months.   Len Childs, MD [facility] [phone]

## 2019-11-18 DIAGNOSIS — R69 Illness, unspecified: Secondary | ICD-10-CM | POA: Diagnosis not present

## 2019-11-30 NOTE — Progress Notes (Signed)
Cardiology Office Note:    Date:  12/02/2019   ID:  Allison [name], DOB Nov 16, 1950, MRN [id number]  PCP:  [name], MD  Cardiologist:  Donato Heinz, MD  Electrophysiologist:  None   Referring MD: [name], MD   Chief Complaint  Patient presents with  . Coronary Artery Disease    History of Present Illness:    Allison [name] is a 69 y.o. female with a hx of CAD status post DES to RCA 06/16/19, stage IV lung cancer, hypertension, hyperlipidemia who presents for follow-up.  She presented to ED on 06/12/2019 with chest pain, found to have NSTEMI with peak high-sensitivity troponin 9133.  TTE on 06/13/2019 showed LVEF 55 to 60%, mild LVH, low normal RV systolic function, moderate MR. Cath showed 95% proximal RCA lesion, drug-eluting stent was successfully placed.  Since last clinic visit, she reports that she has been doing well.  She denies any chest pain or dyspnea.  Has been taking aspirin and Plavix, denies any bleeding issues.  States that she has been having palpitations, can occur up to 3 times per week and lasts for several minutes.  States that feels like heart is racing during episodes.  She has been checking her BP, has been 120s to 150s over 60s to 70s.  Her atorvastatin was discontinued due to transaminitis, she was also tried on pravastatin but again developed transaminitis and has been discontinued.   Past Medical History:  Diagnosis Date  . Anxiety   . Arthritis   . Asthma    exercise induced  . Depression    PMH  . GERD (gastroesophageal reflux disease)   . Glaucoma   . Hypertension   . lung ca dx'd 11/2017   right  . Malignant pleural effusion    right  . PONV (postoperative nausea and vomiting)   . Pre-diabetes   . Raynaud's disease   . Raynaud's disease     Past Surgical History:  Procedure Laterality Date  . ABDOMINAL HYSTERECTOMY     partial  . CHEST TUBE INSERTION Right 01/01/2018   Procedure: INSERTION PLEURAL DRAINAGE  CATHETER;  Surgeon: Ivin Poot, MD;  Location: [facility];  Service: Thoracic;  Laterality: Right;  . COLONOSCOPY    . CORONARY STENT INTERVENTION N/A 06/16/2019   Procedure: CORONARY STENT INTERVENTION;  Surgeon: Jettie Booze, MD;  Location: [facility];  Service: Cardiovascular;  Laterality: N/A;  . DILATION AND CURETTAGE OF UTERUS    . EYE SURGERY     due to Glaucoma  . LEFT HEART CATH AND CORONARY ANGIOGRAPHY N/A 06/16/2019   Procedure: LEFT HEART CATH AND CORONARY ANGIOGRAPHY;  Surgeon: Jettie Booze, MD;  Location: [facility];  Service: Cardiovascular;  Laterality: N/A;  . REMOVAL OF PLEURAL DRAINAGE CATHETER Right 11/07/2018   Procedure: REMOVAL OF PLEURAL DRAINAGE CATHETER;  Surgeon: Ivin Poot, MD;  Location: [place] [facility];  Service: Thoracic;  Laterality: Right;  . ROTATOR CUFF REPAIR    . TUBAL LIGATION    . WISDOM TOOTH EXTRACTION      Current Medications: Current Meds  Medication Sig  . acetaminophen (TYLENOL) 500 MG tablet Take 1,000 mg by mouth every 6 (six) hours as needed (pain).   . Acetylcysteine (N-ACETYL-L-CYSTEINE PO) Take 1 tablet by mouth 2 (two) times daily.  Marland Kitchen ALPRAZolam (XANAX) 0.25 MG tablet Take 0.25 mg by mouth 2 (two) times daily as needed for anxiety.   Marland Kitchen amLODipine (NORVASC) 5 MG tablet Take  5 mg by mouth daily.  Marland Kitchen aspirin 81 MG chewable tablet Chew 1 tablet (81 mg total) by mouth daily.  . Biotin 1000 MCG CHEW Chew 10 mg by mouth daily.  . carvedilol (COREG) 6.25 MG tablet Take 1 tablet (6.25 mg total) by mouth 2 (two) times daily with a meal.  . clopidogrel (PLAVIX) 75 MG tablet Take 1 tablet (75 mg total) by mouth daily with breakfast.  . dorzolamide-timolol (COSOPT) 22.3-6.8 MG/ML ophthalmic solution Instill 1 drop in both eyes twice daily  . ezetimibe (ZETIA) 10 MG tablet Take 10 mg by mouth daily.   Marland Kitchen latanoprost (XALATAN) 0.005 % ophthalmic solution Place 1 drop into both eyes at bedtime.   . Melatonin 5 MG CAPS Take 10  mg by mouth at bedtime as needed (sleep).  . nitroGLYCERIN (NITROSTAT) 0.4 MG SL tablet Place 1 tablet (0.4 mg total) under the tongue every 5 (five) minutes x 3 doses as needed for chest pain.  Marland Kitchen osimertinib mesylate (TAGRISSO) 80 MG tablet Take 1 tablet (80 mg total) by mouth daily.  . traMADol (ULTRAM) 50 MG tablet Take 50 mg by mouth 2 (two) times daily as needed.  Marland Kitchen VITAMIN D, CHOLECALCIFEROL, PO Take 5,000 Units by mouth daily.      Allergies:   Penicillins and Vicodin [hydrocodone-acetaminophen]   Social History   Socioeconomic History  . Marital status: Widowed    Spouse name: Not on file  . Number of children: 3  . Years of education: Not on file  . Highest education level: Not on file  Occupational History    Employer: [employer]  Tobacco Use  . Smoking status: Never Smoker  . Smokeless tobacco: Never Used  Vaping Use  . Vaping Use: Never used  Substance and Sexual Activity  . Alcohol use: Yes    Comment: up to 3 drinks per week  . Drug use: No    Comment: CBD oil   . Sexual activity: Not Currently    Comment: Hysterectomy  Other Topics Concern  . Not on file  Social History Narrative  . Not on file   Social Determinants of Health   Financial Resource Strain: Low Risk   . Difficulty of Paying Living Expenses: Not hard at all  Food Insecurity: No Food Insecurity  . Worried About Charity fundraiser in the Last Year: Never true  . Ran Out of Food in the Last Year: Never true  Transportation Needs: No Transportation Needs  . Lack of Transportation (Medical): No  . Lack of Transportation (Non-Medical): No  Physical Activity: Sufficiently Active  . Days of Exercise per Week: 5 days  . Minutes of Exercise per Session: 30 min  Stress:   . Feeling of Stress :   Social Connections: Unknown  . Frequency of Communication with Friends and Family: Three times a week  . Frequency of Social Gatherings with Friends and Family: Once a week  . Attends Religious Services:  Not on file  . Active Member of Clubs or Organizations: Yes  . Attends Archivist Meetings: More than 4 times per year  . Marital Status: Living with partner     Family History: The patient's family history includes Heart disease in her brother and sister; Lung cancer in an other family member.  ROS:   Please see the history of present illness.     All other systems reviewed and are negative.  EKGs/Labs/Other Studies Reviewed:    The following studies were reviewed  today:   EKG:  EKG is ordered today.  EKG shows sinus bradycardia, rate 59, no ST abnormalities  Cath 06/16/19:  Prox LAD lesion is 25% stenosed.  Prox RCA lesion is 95% stenosed.  A drug-eluting stent was successfully placed using a SYNERGY XD 2.25X28, postdilated to 2.5 mm.  Post intervention, there is a 0% residual stenosis.  The left ventricular systolic function is normal.  LV end diastolic pressure is normal.  The left ventricular ejection fraction is 55-65% by visual estimate.  There is no aortic valve stenosis.   Continue dual antiplatelet therapy.  Would check with Pharmacy about interactions of Plavix with Tagrisso.  If there is an interaction, would switch to Brilinta.       TTE 06/13/19: 1. Left ventricular ejection fraction, by visual estimation, is 55 to  60%. The left ventricle has normal function. There is mildly increased  left ventricular hypertrophy.  2. The left ventricle has no regional wall motion abnormalities.  3. Global right ventricle has low normal systolic function.The right  ventricular size is normal. No increase in right ventricular wall  thickness.  4. Left atrial size was normal.  5. Right atrial size was normal.  6. Mild mitral annular calcification.  7. The mitral valve is degenerative. Moderate mitral valve regurgitation.  8. The tricuspid valve is grossly normal.  9. The aortic valve is tricuspid. Aortic valve regurgitation is not  visualized.  No evidence of aortic valve sclerosis or stenosis.  10. The pulmonic valve was grossly normal. Pulmonic valve regurgitation is  not visualized.  11. Normal pulmonary artery systolic pressure.  12. The inferior vena cava is normal in size with greater than 50%  respiratory variability, suggesting right atrial pressure of 3 mmHg.   Carotid duplex 08/01/19: Right Carotid: There is no evidence of stenosis in the right ICA. The         extracranial vessels were near-normal with only minimal  wall         thickening or plaque.   Left Carotid: Velocities in the left ICA are consistent with a 1-39%  stenosis.        Non-hemodynamically significant plaque <50% noted in the  CCA.   Vertebrals: Bilateral vertebral arteries demonstrate antegrade flow.  Subclavians: Normal flow hemodynamics were seen in bilateral subclavian        arteries.   Recent Labs: 06/13/2019: B Natriuretic Peptide 177.4; TSH 1.774 06/17/2019: Magnesium 1.8 12/02/2019: ALT 29; BUN 14; Creatinine 1.09; Hemoglobin 12.3; Platelet Count 197; Potassium 5.1; Sodium 140  Recent Lipid Panel    Component Value Date/Time   CHOL 119 08/15/2019 0925   TRIG 31 08/15/2019 0925   HDL 66 08/15/2019 0925   CHOLHDL 1.8 08/15/2019 0925   CHOLHDL 4.1 06/13/2019 0100   VLDL 13 06/13/2019 0100   LDLCALC 44 08/15/2019 0925    Physical Exam:    VS:  BP 140/70   Pulse (!) 59   Temp (!) 96.6 F (35.9 C)   Ht 5\' 4"  (1.626 m)   Wt 129 lb 12.8 oz (58.9 kg)   LMP  (LMP Unknown)   SpO2 98%   BMI 22.28 kg/m     Wt Readings from Last 3 Encounters:  12/02/19 132 lb 4.8 oz (60 kg)  12/02/19 129 lb 12.8 oz (58.9 kg)  11/12/19 133 lb 6.4 oz (60.5 kg)     GEN:  Well nourished, well developed in no acute distress HEENT: Normal NECK: No JVD CARDIAC: RRR, no murmurs, rubs,  gallops RESPIRATORY:  Clear to auscultation without rales, wheezing or rhonchi  ABDOMEN: Soft, non-tender,  non-distended MUSCULOSKELETAL:  No edema SKIN: Warm and dry NEUROLOGIC:  Alert and oriented x 3 PSYCHIATRIC:  Normal affect   ASSESSMENT:    1. Coronary artery disease involving native coronary artery of native heart without angina pectoris   2. Hyperlipidemia, unspecified hyperlipidemia type   3. Palpitations   4. Hypertension, unspecified type    PLAN:    CAD: Status post drug-eluting stent to RCA on 06/16/2019.  Denies anginal symptoms.   -Continue aspirin 81 mg daily, Plavix 75 mg daily.  Will continue aspirin indefinitely.  Continue Plavix through 05/2020 -Continue Coreg to 6.25 mg twice daily -Unable to tolerate atorvastain or pravastatin due to transaminitis.  Will refer to lipid clinc  Palpitations: Description concerning for arrhythmia, will check Zio patch x2 weeks  Carotid bruit: 1 to 39% left carotid artery stenosis, no stenosis in right coronary artery  Hyperlipidemia: LDL 148 on 06/13/2019, improved to LDL 44 08/15/19 while on Lipitor 80 mg daily, but was discontinued due to transaminitis..  Recently started on pravastatin 40 mg daily by PCP, but develoepd transaminitis with this as well.   Will refer to lipid clinic  Hypertension: On carvedilol 6.25 mg twice daily and amlodipine 5 mg daily.  Developed lower extremity edema on amlodipine 10 mg.  BP remains above goal less than 130/80, will add hydrochlorothiazide 12.5 mg daily, which she was taking previously.  Asked to monitor BP daily for next 2 weeks and call with results.  Stage IV lung cancer: Followed by oncology service.  On Tagrisso  RTC in 3 months  Medication Adjustments/Labs and Tests Ordered: Current medicines are reviewed at length with the patient today.  Concerns regarding medicines are outlined above.  Orders Placed This Encounter  Procedures  . Basic metabolic panel  . Magnesium  . AMB Referral to [facility]  . LONG TERM MONITOR (3-14 DAYS)  . EKG 12-Lead   Meds ordered this  encounter  Medications  . hydrochlorothiazide (MICROZIDE) 12.5 MG capsule    Sig: Take 1 capsule (12.5 mg total) by mouth daily.    Dispense:  90 capsule    Refill:  3    Patient Instructions  Medication Instructions:  Start HCTZ 12.5mg  Daily *If you need a refill on your cardiac medications before your next appointment, please call your pharmacy*   Lab Work: BMP, Magnesium in 1 week If you have labs (blood work) drawn today and your tests are completely normal, you will receive your results only by: Marland Kitchen MyChart Message (if you have MyChart) OR . A paper copy in the mail If you have any lab test that is abnormal or we need to change your treatment, we will call you to review the results.   Testing/Procedures: [facility]- Long Term Monitor Instructions   Your physician has requested you wear your ZIO patch monitor___14____days.   This is a single patch monitor.  [employer] supplies one patch monitor per enrollment.  Additional stickers are not available.   Please do not apply patch if you will be having a Nuclear Stress Test, Echocardiogram, Cardiac CT, MRI, or Chest Xray during the time frame you would be wearing the monitor. The patch cannot be worn during these tests.  You cannot remove and re-apply the ZIO XT patch monitor.   Your ZIO patch monitor will be sent USPS Priority mail from [facility] directly to your home address. The monitor may  also be mailed to a PO BOX if home delivery is not available.   It may take 3-5 days to receive your monitor after you have been enrolled.   Once you have received you monitor, please review enclosed instructions.  Your monitor has already been registered assigning a specific monitor serial # to you.   Applying the monitor   Shave hair from upper left chest.   Hold abrader disc by orange tab.  Rub abrader in 40 strokes over left upper chest as indicated in your monitor instructions.   Clean area with 4 enclosed alcohol pads .  Use all  pads to assure are is cleaned thoroughly.  Let dry.   Apply patch as indicated in monitor instructions.  Patch will be place under collarbone on left side of chest with arrow pointing upward.   Rub patch adhesive wings for 2 minutes.Remove white label marked "1".  Remove white label marked "2".  Rub patch adhesive wings for 2 additional minutes.   While looking in a mirror, press and release button in center of patch.  A small green light will flash 3-4 times .  This will be your only indicator the monitor has been turned on.     Do not shower for the first 24 hours.  You may shower after the first 24 hours.   Press button if you feel a symptom. You will hear a small click.  Record Date, Time and Symptom in the Patient Log Book.   When you are ready to remove patch, follow instructions on last 2 pages of Patient Log Book.  Stick patch monitor onto last page of Patient Log Book.   Place Patient Log Book in [place] box.  Use locking tab on box and tape box closed securely.  The [employer] has [employer] on it.  Please place in mailbox as soon as possible.  Your physician should have your test results approximately 7 days after the monitor has been mailed back to [facility].   Call [place] at [phone] if you have questions regarding your ZIO XT patch monitor.  Call them immediately if you see an orange light blinking on your monitor.   If your monitor falls off in less than 4 days contact our Monitor department at [phone].  If your monitor becomes loose or falls off after 4 days call [employer] at [phone] for suggestions on securing your monitor.     Follow-Up: At [facility], you and your health needs are our priority.  As part of our continuing mission to provide you with exceptional heart care, we have created designated Provider Care Teams.  These Care Teams include your primary Cardiologist (physician) and Advanced Practice Providers  (APPs -  Physician Assistants and Nurse Practitioners) who all work together to provide you with the care you need, when you need it.  We recommend signing up for the patient portal called "MyChart".  Sign up information is provided on this After Visit Summary.  MyChart is used to connect with patients for Virtual Visits (Telemedicine).  Patients are able to view lab/test results, encounter notes, upcoming appointments, etc.  Non-urgent messages can be sent to your provider as well.   To learn more about what you can do with MyChart, go to [email or web address].    Your next appointment:   3 month(s)  The format for your next appointment:   In Person  Provider:   Oswaldo Milian, MD   Other Instructions You have  been referred to Lipid Clinic (Pharmacy). Please check Blood Pressure Daily write down. Call office or send message via MyChart in 2 weeks to report Blood Pressure Readings.    Signed, Donato Heinz, MD  12/02/2019 11:54 PM    [place]

## 2019-12-02 ENCOUNTER — Ambulatory Visit: Payer: Medicare HMO | Admitting: Cardiology

## 2019-12-02 ENCOUNTER — Inpatient Hospital Stay: Payer: Medicare HMO | Attending: Internal Medicine | Admitting: Internal Medicine

## 2019-12-02 ENCOUNTER — Telehealth: Payer: Self-pay | Admitting: Radiology

## 2019-12-02 ENCOUNTER — Other Ambulatory Visit: Payer: Self-pay

## 2019-12-02 ENCOUNTER — Encounter: Payer: Self-pay | Admitting: Internal Medicine

## 2019-12-02 ENCOUNTER — Telehealth: Payer: Self-pay | Admitting: Internal Medicine

## 2019-12-02 ENCOUNTER — Inpatient Hospital Stay: Payer: Medicare HMO

## 2019-12-02 VITALS — BP 140/70 | HR 59 | Temp 96.6°F | Ht 64.0 in | Wt 129.8 lb

## 2019-12-02 DIAGNOSIS — F419 Anxiety disorder, unspecified: Secondary | ICD-10-CM | POA: Diagnosis not present

## 2019-12-02 DIAGNOSIS — C3490 Malignant neoplasm of unspecified part of unspecified bronchus or lung: Secondary | ICD-10-CM

## 2019-12-02 DIAGNOSIS — I2510 Atherosclerotic heart disease of native coronary artery without angina pectoris: Secondary | ICD-10-CM

## 2019-12-02 DIAGNOSIS — K219 Gastro-esophageal reflux disease without esophagitis: Secondary | ICD-10-CM | POA: Insufficient documentation

## 2019-12-02 DIAGNOSIS — R002 Palpitations: Secondary | ICD-10-CM

## 2019-12-02 DIAGNOSIS — E785 Hyperlipidemia, unspecified: Secondary | ICD-10-CM

## 2019-12-02 DIAGNOSIS — Z79899 Other long term (current) drug therapy: Secondary | ICD-10-CM | POA: Diagnosis not present

## 2019-12-02 DIAGNOSIS — M1990 Unspecified osteoarthritis, unspecified site: Secondary | ICD-10-CM | POA: Insufficient documentation

## 2019-12-02 DIAGNOSIS — I10 Essential (primary) hypertension: Secondary | ICD-10-CM

## 2019-12-02 DIAGNOSIS — C3491 Malignant neoplasm of unspecified part of right bronchus or lung: Secondary | ICD-10-CM

## 2019-12-02 DIAGNOSIS — F329 Major depressive disorder, single episode, unspecified: Secondary | ICD-10-CM | POA: Diagnosis not present

## 2019-12-02 DIAGNOSIS — J910 Malignant pleural effusion: Secondary | ICD-10-CM | POA: Diagnosis not present

## 2019-12-02 DIAGNOSIS — R69 Illness, unspecified: Secondary | ICD-10-CM | POA: Diagnosis not present

## 2019-12-02 DIAGNOSIS — C3411 Malignant neoplasm of upper lobe, right bronchus or lung: Secondary | ICD-10-CM | POA: Insufficient documentation

## 2019-12-02 LAB — CBC WITH DIFFERENTIAL (CANCER CENTER ONLY)
Abs Immature Granulocytes: 0 10*3/uL (ref 0.00–0.07)
Basophils Absolute: 0 10*3/uL (ref 0.0–0.1)
Basophils Relative: 1 %
Eosinophils Absolute: 0.1 10*3/uL (ref 0.0–0.5)
Eosinophils Relative: 3 %
HCT: 39.5 % (ref 36.0–46.0)
Hemoglobin: 12.3 g/dL (ref 12.0–15.0)
Immature Granulocytes: 0 %
Lymphocytes Relative: 42 %
Lymphs Abs: 1.8 10*3/uL (ref 0.7–4.0)
MCH: 25.3 pg — ABNORMAL LOW (ref 26.0–34.0)
MCHC: 31.1 g/dL (ref 30.0–36.0)
MCV: 81.1 fL (ref 80.0–100.0)
Monocytes Absolute: 0.5 10*3/uL (ref 0.1–1.0)
Monocytes Relative: 11 %
Neutro Abs: 1.8 10*3/uL (ref 1.7–7.7)
Neutrophils Relative %: 43 %
Platelet Count: 197 10*3/uL (ref 150–400)
RBC: 4.87 MIL/uL (ref 3.87–5.11)
RDW: 14.6 % (ref 11.5–15.5)
WBC Count: 4.2 10*3/uL (ref 4.0–10.5)
nRBC: 0 % (ref 0.0–0.2)

## 2019-12-02 LAB — CMP (CANCER CENTER ONLY)
ALT: 29 U/L (ref 0–44)
AST: 28 U/L (ref 15–41)
Albumin: 4.1 g/dL (ref 3.5–5.0)
Alkaline Phosphatase: 70 U/L (ref 38–126)
Anion gap: 9 (ref 5–15)
BUN: 14 mg/dL (ref 8–23)
CO2: 25 mmol/L (ref 22–32)
Calcium: 9.6 mg/dL (ref 8.9–10.3)
Chloride: 106 mmol/L (ref 98–111)
Creatinine: 1.09 mg/dL — ABNORMAL HIGH (ref 0.44–1.00)
GFR, Est AFR Am: >60 mL/min (ref >60)
GFR, Estimated: 52 mL/min — ABNORMAL LOW (ref >60)
Glucose, Bld: 96 mg/dL (ref 70–99)
Potassium: 5.1 mmol/L (ref 3.5–5.1)
Sodium: 140 mmol/L (ref 135–145)
Total Bilirubin: 0.4 mg/dL (ref 0.3–1.2)
Total Protein: 7.7 g/dL (ref 6.5–8.1)

## 2019-12-02 MED ORDER — HYDROCHLOROTHIAZIDE 12.5 MG PO CAPS: 12.5000 mg | ORAL_CAPSULE | Freq: Every day | ORAL | 3 refills | Status: DC

## 2019-12-02 NOTE — Telephone Encounter (Signed)
Enrolled patient for a 14 day Zio monitor to be mailed to patients home.  

## 2019-12-02 NOTE — Progress Notes (Signed)
[facility] [phone]   [phone]  OFFICE PROGRESS NOTE  [name], [place] [address]  DIAGNOSIS: Stage IV (T2 a,N2, M1a) non-small cell lung cancer, adenocarcinoma diagnosed in July 2019 and presented with right upper lobe lung mass in addition to mediastinal lymphadenopathy as well as bilateral pulmonary nodules and malignant right pleural effusion.  Biomarker Findings Microsatellite status - Cannot Be Determined Tumor Mutational Burden - Cannot Be Determined Genomic Findings For a complete list of the genes assayed, please refer to the Appendix. EGFR exon 19 deletion ([id number]) TP53 Y220C 7 Disease relevant genes with no reportable alterations: KRAS, ALK, BRAF, MET, RET, ERBB2, ROS1   PRIOR THERAPY: Status post right Pleurx catheter placement by Dr. Prescott Gum for drainage of malignant right pleural effusion.  CURRENT THERAPY: Tagrisso 80 mg p.o. daily.  First dose was given on 01/29/2018.  Status post 22 months of treatment.  INTERVAL HISTORY: Allison [name] 69 y.o. female returns to the clinic today for follow-up visit.  The patient is feeling fine today with no concerning complaints except for some changes in her vision.  She denied having any chest pain, shortness of breath, cough or hemoptysis.  She denied having any fever or chills.  She has no nausea, vomiting, diarrhea or constipation.  She has no headache.  She is here today for evaluation and repeat blood work.   MEDICAL HISTORY: Past Medical History:  Diagnosis Date  . Anxiety   . Arthritis   . Asthma    exercise induced  . Depression    PMH  . GERD (gastroesophageal reflux disease)   . Glaucoma   . Hypertension   . lung ca dx'd 11/2017   right  . Malignant pleural effusion    right  . PONV (postoperative nausea and vomiting)   . Pre-diabetes   . Raynaud's disease   . Raynaud's disease     ALLERGIES:  is allergic to  penicillins and vicodin [hydrocodone-acetaminophen].  MEDICATIONS:  Current Outpatient Medications  Medication Sig Dispense Refill  . acetaminophen (TYLENOL) 500 MG tablet Take 1,000 mg by mouth every 6 (six) hours as needed (pain).     . Acetylcysteine (N-ACETYL-L-CYSTEINE PO) Take 1 tablet by mouth 2 (two) times daily.    Marland Kitchen ALPRAZolam (XANAX) 0.25 MG tablet Take 0.25 mg by mouth 2 (two) times daily as needed for anxiety.     Marland Kitchen amLODipine (NORVASC) 5 MG tablet Take 5 mg by mouth daily.    Marland Kitchen aspirin 81 MG chewable tablet Chew 1 tablet (81 mg total) by mouth daily.    . Biotin 1000 MCG CHEW Chew 10 mg by mouth daily.    . carvedilol (COREG) 6.25 MG tablet Take 1 tablet (6.25 mg total) by mouth 2 (two) times daily with a meal. 180 tablet 3  . clopidogrel (PLAVIX) 75 MG tablet Take 1 tablet (75 mg total) by mouth daily with breakfast. 90 tablet 3  . dorzolamide-timolol (COSOPT) 22.3-6.8 MG/ML ophthalmic solution Instill 1 drop in both eyes twice daily    . ezetimibe (ZETIA) 10 MG tablet Take 10 mg by mouth daily.     . hydrochlorothiazide (MICROZIDE) 12.5 MG capsule Take 1 capsule (12.5 mg total) by mouth daily. 90 capsule 3  . latanoprost (XALATAN) 0.005 % ophthalmic solution Place 1 drop into both eyes at bedtime.     . Melatonin 5 MG CAPS Take 10 mg by mouth at bedtime as needed (  sleep).    . nitroGLYCERIN (NITROSTAT) 0.4 MG SL tablet Place 1 tablet (0.4 mg total) under the tongue every 5 (five) minutes x 3 doses as needed for chest pain. 30 tablet 12  . osimertinib mesylate (TAGRISSO) 80 MG tablet Take 1 tablet (80 mg total) by mouth daily. 30 tablet 3  . traMADol (ULTRAM) 50 MG tablet Take 50 mg by mouth 2 (two) times daily as needed.    Marland Kitchen VITAMIN D, CHOLECALCIFEROL, PO Take 5,000 Units by mouth daily.      No current facility-administered medications for this visit.    SURGICAL HISTORY:  Past Surgical History:  Procedure Laterality Date  . ABDOMINAL HYSTERECTOMY     partial  . CHEST  TUBE INSERTION Right 01/01/2018   Procedure: INSERTION PLEURAL DRAINAGE CATHETER;  Surgeon: Ivin Poot, MD;  Location: [facility];  Service: Thoracic;  Laterality: Right;  . COLONOSCOPY    . CORONARY STENT INTERVENTION N/A 06/16/2019   Procedure: CORONARY STENT INTERVENTION;  Surgeon: Jettie Booze, MD;  Location: [facility];  Service: Cardiovascular;  Laterality: N/A;  . DILATION AND CURETTAGE OF UTERUS    . EYE SURGERY     due to Glaucoma  . LEFT HEART CATH AND CORONARY ANGIOGRAPHY N/A 06/16/2019   Procedure: LEFT HEART CATH AND CORONARY ANGIOGRAPHY;  Surgeon: Jettie Booze, MD;  Location: [facility];  Service: Cardiovascular;  Laterality: N/A;  . REMOVAL OF PLEURAL DRAINAGE CATHETER Right 11/07/2018   Procedure: REMOVAL OF PLEURAL DRAINAGE CATHETER;  Surgeon: Ivin Poot, MD;  Location: [facility];  Service: Thoracic;  Laterality: Right;  . ROTATOR CUFF REPAIR    . TUBAL LIGATION    . WISDOM TOOTH EXTRACTION      REVIEW OF SYSTEMS:  A comprehensive review of systems was negative except for: Eyes: positive for visual disturbance   PHYSICAL EXAMINATION: General appearance: alert, cooperative and no distress Head: Normocephalic, without obvious abnormality, atraumatic Neck: no adenopathy, no JVD, supple, symmetrical, trachea midline and thyroid not enlarged, symmetric, no tenderness/mass/nodules Lymph nodes: Cervical, supraclavicular, and axillary nodes normal. Resp: clear to auscultation bilaterally Back: symmetric, no curvature. ROM normal. No CVA tenderness. Cardio: regular rate and rhythm, S1, S2 normal, no murmur, click, rub or gallop GI: soft, non-tender; bowel sounds normal; no masses,  no organomegaly Extremities: extremities normal, atraumatic, no cyanosis or edema  ECOG PERFORMANCE STATUS: 1 - Symptomatic but completely ambulatory  Blood pressure (!) 159/73, pulse (!) 57, temperature 97.9 F (36.6 C), temperature source Temporal, resp. rate 20,  height '5\' 4"'  (1.626 m), weight 132 lb 4.8 oz (60 kg), SpO2 100 %.  LABORATORY DATA: Lab Results  Component Value Date   WBC 4.2 12/02/2019   HGB 12.3 12/02/2019   HCT 39.5 12/02/2019   MCV 81.1 12/02/2019   PLT 197 12/02/2019      Chemistry      Component Value Date/Time   NA 144 09/29/2019 1039   K 4.0 09/29/2019 1039   CL 108 09/29/2019 1039   CO2 27 09/29/2019 1039   BUN 10 09/29/2019 1039   CREATININE 0.85 09/29/2019 1039      Component Value Date/Time   CALCIUM 9.3 09/29/2019 1039   ALKPHOS 76 09/29/2019 1039   AST 36 09/29/2019 1039   ALT 50 (H) 09/29/2019 1039   BILITOT 0.4 09/29/2019 1039       RADIOGRAPHIC STUDIES: DG Chest 2 View  Result Date: 11/12/2019 CLINICAL DATA:  RIGHT pleural effusion.  Non-small cell  lung cancer EXAM: CHEST - 2 VIEW COMPARISON:  CT 09/29/2019 FINDINGS: 16 mm nodule at the RIGHT lung apex not changed from CT exam. Trace RIGHT pleural thickening and effusion. No infiltrate. No pneumothorax. IMPRESSION: 1. No interval change from CT 09/29/2019. 2. RIGHT upper lobe nodule. 3. Trace RIGHT effusion. Electronically Signed   By: Suzy Bouchard M.D.   On: 11/12/2019 13:32    ASSESSMENT AND PLAN: This is a very pleasant 69 years old never smoker African-American female recently with a stage IV non-small cell lung cancer, adenocarcinoma with positive EGFR mutation with deletion in exon 19. The patient was started on treatment with Tagrisso 80 mg p.o. daily status post 22 months of treatment. The patient has been tolerating this treatment well with no concerning adverse effects. I recommended for her to continue her current treatment with Tagrisso with the same dose. I will see her back for follow-up visit in around 6 weeks with repeat CT scan of the chest, abdomen pelvis as well as MRI of the brain to rule out any metastatic brain lesion especially with the new symptoms of visual disturbance. For the hypertension, the patient was seen by her  cardiologist earlier today and he added hydrochlorothiazide to her treatment. She was advised to call immediately if she has any concerning symptoms in the interval. The patient voices understanding of current disease status and treatment options and is in agreement with the current care plan. All questions were answered. The patient knows to call the clinic with any problems, questions or concerns. We can certainly see the patient much sooner if necessary.  Disclaimer: This note was dictated with voice recognition software. Similar sounding words can inadvertently be transcribed and may not be corrected upon review.

## 2019-12-02 NOTE — Patient Instructions (Addendum)
Medication Instructions:  Start HCTZ 12.5mg  Daily *If you need a refill on your cardiac medications before your next appointment, please call your pharmacy*   Lab Work: BMP, Magnesium in 1 week If you have labs (blood work) drawn today and your tests are completely normal, you will receive your results only by: Marland Kitchen MyChart Message (if you have MyChart) OR . A paper copy in the mail If you have any lab test that is abnormal or we need to change your treatment, we will call you to review the results.   Testing/Procedures: [facility]- Long Term Monitor Instructions   Your physician has requested you wear your ZIO patch monitor___14____days.   This is a single patch monitor.  [employer] supplies one patch monitor per enrollment.  Additional stickers are not available.   Please do not apply patch if you will be having a Nuclear Stress Test, Echocardiogram, Cardiac CT, MRI, or Chest Xray during the time frame you would be wearing the monitor. The patch cannot be worn during these tests.  You cannot remove and re-apply the ZIO XT patch monitor.   Your ZIO patch monitor will be sent USPS Priority mail from [facility] directly to your home address. The monitor may also be mailed to a PO BOX if home delivery is not available.   It may take 3-5 days to receive your monitor after you have been enrolled.   Once you have received you monitor, please review enclosed instructions.  Your monitor has already been registered assigning a specific monitor serial # to you.   Applying the monitor   Shave hair from upper left chest.   Hold abrader disc by orange tab.  Rub abrader in 40 strokes over left upper chest as indicated in your monitor instructions.   Clean area with 4 enclosed alcohol pads .  Use all pads to assure are is cleaned thoroughly.  Let dry.   Apply patch as indicated in monitor instructions.  Patch will be place under collarbone on left side of chest with arrow pointing upward.   Rub  patch adhesive wings for 2 minutes.Remove white label marked "1".  Remove white label marked "2".  Rub patch adhesive wings for 2 additional minutes.   While looking in a mirror, press and release button in center of patch.  A small green light will flash 3-4 times .  This will be your only indicator the monitor has been turned on.     Do not shower for the first 24 hours.  You may shower after the first 24 hours.   Press button if you feel a symptom. You will hear a small click.  Record Date, Time and Symptom in the Patient Log Book.   When you are ready to remove patch, follow instructions on last 2 pages of Patient Log Book.  Stick patch monitor onto last page of Patient Log Book.   Place Patient Log Book in [place] box.  Use locking tab on box and tape box closed securely.  The [employer] and [employer] has [employer] on it.  Please place in mailbox as soon as possible.  Your physician should have your test results approximately 7 days after the monitor has been mailed back to [facility].   Call [facility] at [phone] if you have questions regarding your ZIO XT patch monitor.  Call them immediately if you see an orange light blinking on your monitor.   If your monitor falls off in less than 4  days contact our Monitor department at [phone].  If your monitor becomes loose or falls off after 4 days call [employer] at [phone] for suggestions on securing your monitor.     Follow-Up: At [facility], you and your health needs are our priority.  As part of our continuing mission to provide you with exceptional heart care, we have created designated Provider Care Teams.  These Care Teams include your primary Cardiologist (physician) and Advanced Practice Providers (APPs -  Physician Assistants and Nurse Practitioners) who all work together to provide you with the care you need, when you need it.  We recommend signing up for the patient portal called "MyChart".   Sign up information is provided on this After Visit Summary.  MyChart is used to connect with patients for Virtual Visits (Telemedicine).  Patients are able to view lab/test results, encounter notes, upcoming appointments, etc.  Non-urgent messages can be sent to your provider as well.   To learn more about what you can do with MyChart, go to [email or web address].    Your next appointment:   3 month(s)  The format for your next appointment:   In Person  Provider:   Oswaldo Milian, MD   Other Instructions You have been referred to [facility] (Pharmacy). Please check Blood Pressure Daily write down. Call office or send message via MyChart in 2 weeks to report Blood Pressure Readings.

## 2019-12-02 NOTE — Telephone Encounter (Signed)
Scheduled per 06/15 los, patient has received calender.

## 2019-12-03 ENCOUNTER — Other Ambulatory Visit: Payer: Self-pay | Admitting: Pharmacist

## 2019-12-03 ENCOUNTER — Encounter: Payer: Self-pay | Admitting: Internal Medicine

## 2019-12-03 ENCOUNTER — Other Ambulatory Visit: Payer: Self-pay | Admitting: Medical Oncology

## 2019-12-03 DIAGNOSIS — C3491 Malignant neoplasm of unspecified part of right bronchus or lung: Secondary | ICD-10-CM

## 2019-12-03 MED ORDER — OSIMERTINIB MESYLATE 80 MG PO TABS: 80.0000 mg | ORAL_TABLET | Freq: Every day | ORAL | 3 refills | Status: DC

## 2019-12-05 ENCOUNTER — Other Ambulatory Visit (INDEPENDENT_AMBULATORY_CARE_PROVIDER_SITE_OTHER): Payer: Medicare HMO

## 2019-12-05 DIAGNOSIS — E785 Hyperlipidemia, unspecified: Secondary | ICD-10-CM

## 2019-12-05 DIAGNOSIS — I10 Essential (primary) hypertension: Secondary | ICD-10-CM | POA: Diagnosis not present

## 2019-12-05 DIAGNOSIS — R002 Palpitations: Secondary | ICD-10-CM | POA: Diagnosis not present

## 2019-12-05 DIAGNOSIS — I2510 Atherosclerotic heart disease of native coronary artery without angina pectoris: Secondary | ICD-10-CM

## 2019-12-08 ENCOUNTER — Telehealth: Payer: Self-pay | Admitting: Cardiology

## 2019-12-08 NOTE — Telephone Encounter (Signed)
Patient calling for EKG results.

## 2019-12-08 NOTE — Telephone Encounter (Signed)
Spoke with patient of Dr. Gardiner Rhyme who was seen 6/15. She is asking about her EKG results from this visit. Explained that we do not call with these results and that MD will review if there is anything concerning. Read to her MD comments from his note about her EKG - as noted below:  EKG:  EKG is ordered today.  EKG shows sinus bradycardia, rate 59, no ST abnormalities  No further assistance needed

## 2019-12-09 DIAGNOSIS — R002 Palpitations: Secondary | ICD-10-CM | POA: Diagnosis not present

## 2019-12-09 DIAGNOSIS — I10 Essential (primary) hypertension: Secondary | ICD-10-CM | POA: Diagnosis not present

## 2019-12-09 DIAGNOSIS — I2510 Atherosclerotic heart disease of native coronary artery without angina pectoris: Secondary | ICD-10-CM | POA: Diagnosis not present

## 2019-12-09 DIAGNOSIS — E785 Hyperlipidemia, unspecified: Secondary | ICD-10-CM | POA: Diagnosis not present

## 2019-12-09 LAB — BASIC METABOLIC PANEL
BUN/Creatinine Ratio: 15 (ref 12–28)
BUN: 16 mg/dL (ref 8–27)
CO2: 24 mmol/L (ref 20–29)
Calcium: 9.7 mg/dL (ref 8.7–10.3)
Chloride: 101 mmol/L (ref 96–106)
Creatinine, Ser: 1.08 mg/dL — ABNORMAL HIGH (ref 0.57–1.00)
GFR calc Af Amer: 61 mL/min/{1.73_m2} (ref >59)
GFR calc non Af Amer: 53 mL/min/{1.73_m2} — ABNORMAL LOW (ref >59)
Glucose: 93 mg/dL (ref 65–99)
Potassium: 4.9 mmol/L (ref 3.5–5.2)
Sodium: 137 mmol/L (ref 134–144)

## 2019-12-09 LAB — MAGNESIUM: Magnesium: 2.1 mg/dL (ref 1.6–2.3)

## 2019-12-17 ENCOUNTER — Other Ambulatory Visit: Payer: Self-pay

## 2019-12-17 ENCOUNTER — Ambulatory Visit: Payer: Medicare HMO | Admitting: Physician Assistant

## 2019-12-17 VITALS — BP 144/68 | HR 60 | Temp 98.7°F | Resp 18 | Ht 64.0 in | Wt 130.0 lb

## 2019-12-17 DIAGNOSIS — I10 Essential (primary) hypertension: Secondary | ICD-10-CM

## 2019-12-17 NOTE — Progress Notes (Signed)
Established Patient Office Visit  Subjective:  Patient ID: Allison [name], female    DOB: 05-21-1951  Age: 69 y.o. MRN: [id number]  CC:  Chief Complaint  Patient presents with  . Hypertension    HPI  Allison [name]  reports that she was seen by cardiology 2 weeks ago, they did add hydrochlorothiazide 12.5 mg to her regimen.  Reports that her blood pressure continues to be elevated.  Reports that she did have an episode yesterday of feeling faint approximately 20 minutes after taking the hydrochlorothiazide and eating her first meal of the day.  Reports that she had just come in from outside.  Reports that she laid down with relief.  Reports that the [place] "dries her out" and she drinks approximately 3 bottles of water a day.    Plan from cardiology note:  EKG:  EKG is ordered today.  EKG shows sinus bradycardia, rate 59, no ST abnormalities   CAD: Status post drug-eluting stent to RCA on 06/16/2019.  Denies anginal symptoms.   -Continue aspirin 81 mg daily, Plavix 75 mg daily.  Will continue aspirin indefinitely.  Continue Plavix through 05/2020 -Continue Coreg to 6.25 mg twice daily -Unable to tolerate atorvastain or pravastatin due to transaminitis.  Will refer to lipid clinc  Palpitations: Description concerning for arrhythmia, will check Zio patch x2 weeks  Carotid bruit: 1 to 39% left carotid artery stenosis, no stenosis in right coronary artery  Hyperlipidemia: LDL 148 on 06/13/2019, improved to LDL 44 08/15/19 while on Lipitor 80 mg daily, but was discontinued due to transaminitis..  Recently started on pravastatin 40 mg daily by PCP, but develoepd transaminitis with this as well.   Will refer to lipid clinic  Hypertension: On carvedilol 6.25 mg twice daily and amlodipine 5 mg daily.  Developed lower extremity edema on amlodipine 10 mg.  BP remains above goal less than 130/80, will add hydrochlorothiazide 12.5 mg daily, which she was taking previously.  Asked to  monitor BP daily for next 2 weeks and call with results.  Stage IV lung cancer: Followed by oncology service. On [place]      Past Medical History:  Diagnosis Date  . Anxiety   . Arthritis   . Asthma    exercise induced  . Depression    PMH  . GERD (gastroesophageal reflux disease)   . Glaucoma   . Hypertension   . lung ca dx'd 11/2017   right  . Malignant pleural effusion    right  . PONV (postoperative nausea and vomiting)   . Pre-diabetes   . Raynaud's disease   . Raynaud's disease     Past Surgical History:  Procedure Laterality Date  . ABDOMINAL HYSTERECTOMY     partial  . CHEST TUBE INSERTION Right 01/01/2018   Procedure: INSERTION PLEURAL DRAINAGE CATHETER;  Surgeon: Ivin Poot, MD;  Location: [facility];  Service: Thoracic;  Laterality: Right;  . COLONOSCOPY    . CORONARY STENT INTERVENTION N/A 06/16/2019   Procedure: CORONARY STENT INTERVENTION;  Surgeon: Jettie Booze, MD;  Location: [facility];  Service: Cardiovascular;  Laterality: N/A;  . DILATION AND CURETTAGE OF UTERUS    . EYE SURGERY     due to Glaucoma  . LEFT HEART CATH AND CORONARY ANGIOGRAPHY N/A 06/16/2019   Procedure: LEFT HEART CATH AND CORONARY ANGIOGRAPHY;  Surgeon: Jettie Booze, MD;  Location: [facility];  Service: Cardiovascular;  Laterality: N/A;  . REMOVAL OF PLEURAL DRAINAGE CATHETER  Right 11/07/2018   Procedure: REMOVAL OF PLEURAL DRAINAGE CATHETER;  Surgeon: Ivin Poot, MD;  Location: [facility];  Service: Thoracic;  Laterality: Right;  . ROTATOR CUFF REPAIR    . TUBAL LIGATION    . WISDOM TOOTH EXTRACTION      Family History  Problem Relation Age of Onset  . Heart disease Sister   . Heart disease Brother   . Lung cancer Other     Social History   Socioeconomic History  . Marital status: Widowed    Spouse name: Not on file  . Number of children: 3  . Years of education: Not on file  . Highest education level: Not on file  Occupational  History    Employer: [employer]  Tobacco Use  . Smoking status: Never Smoker  . Smokeless tobacco: Never Used  Vaping Use  . Vaping Use: Never used  Substance and Sexual Activity  . Alcohol use: Yes    Comment: up to 3 drinks per week  . Drug use: No    Comment: CBD oil   . Sexual activity: Not Currently    Comment: Hysterectomy  Other Topics Concern  . Not on file  Social History Narrative  . Not on file   Social Determinants of Health   Financial Resource Strain: Low Risk   . Difficulty of Paying Living Expenses: Not hard at all  Food Insecurity: No Food Insecurity  . Worried About Charity fundraiser in the Last Year: Never true  . Ran Out of Food in the Last Year: Never true  Transportation Needs: No Transportation Needs  . Lack of Transportation (Medical): No  . Lack of Transportation (Non-Medical): No  Physical Activity: Sufficiently Active  . Days of Exercise per Week: 5 days  . Minutes of Exercise per Session: 30 min  Stress:   . Feeling of Stress :   Social Connections: Unknown  . Frequency of Communication with Friends and Family: Three times a week  . Frequency of Social Gatherings with Friends and Family: Once a week  . Attends Religious Services: Not on file  . Active Member of Clubs or Organizations: Yes  . Attends Archivist Meetings: More than 4 times per year  . Marital Status: Living with partner  Intimate Partner Violence:   . Fear of Current or Ex-Partner:   . Emotionally Abused:   Marland Kitchen Physically Abused:   . Sexually Abused:     Outpatient Medications Prior to Visit  Medication Sig Dispense Refill  . acetaminophen (TYLENOL) 500 MG tablet Take 1,000 mg by mouth every 6 (six) hours as needed (pain).     . Acetylcysteine (N-ACETYL-L-CYSTEINE PO) Take 1 tablet by mouth 2 (two) times daily.    Marland Kitchen ALPRAZolam (XANAX) 0.25 MG tablet Take 0.25 mg by mouth 2 (two) times daily as needed for anxiety.     Marland Kitchen amLODipine (NORVASC) 5 MG tablet Take 5 mg by  mouth daily.    Marland Kitchen aspirin 81 MG chewable tablet Chew 1 tablet (81 mg total) by mouth daily.    . Biotin 1000 MCG CHEW Chew 10 mg by mouth daily.    . carvedilol (COREG) 6.25 MG tablet Take 1 tablet (6.25 mg total) by mouth 2 (two) times daily with a meal. 180 tablet 3  . clopidogrel (PLAVIX) 75 MG tablet Take 1 tablet (75 mg total) by mouth daily with breakfast. 90 tablet 3  . dorzolamide-timolol (COSOPT) 22.3-6.8 MG/ML ophthalmic solution Instill 1 drop in  both eyes twice daily    . ezetimibe (ZETIA) 10 MG tablet Take 10 mg by mouth daily.     . hydrochlorothiazide (MICROZIDE) 12.5 MG capsule Take 1 capsule (12.5 mg total) by mouth daily. 90 capsule 3  . latanoprost (XALATAN) 0.005 % ophthalmic solution Place 1 drop into both eyes at bedtime.     . Melatonin 5 MG CAPS Take 10 mg by mouth at bedtime as needed (sleep).    . nitroGLYCERIN (NITROSTAT) 0.4 MG SL tablet Place 1 tablet (0.4 mg total) under the tongue every 5 (five) minutes x 3 doses as needed for chest pain. 30 tablet 12  . osimertinib mesylate (TAGRISSO) 80 MG tablet Take 1 tablet (80 mg total) by mouth daily. 30 tablet 3  . traMADol (ULTRAM) 50 MG tablet Take 50 mg by mouth 2 (two) times daily as needed.    Marland Kitchen VITAMIN D, CHOLECALCIFEROL, PO Take 5,000 Units by mouth daily.      No facility-administered medications prior to visit.    Allergies  Allergen Reactions  . Penicillins Other (See Comments)    SYNCOPE PATIENT HAS HAD A PCN REACTION WITH IMMEDIATE RASH, FACIAL/TONGUE/THROAT SWELLING, SOB, OR LIGHTHEADEDNESS WITH HYPOTENSION:  #  #  YES  #  # Has patient had a PCN reaction causing severe rash involving mucus membranes or skin necrosis: No Has patient had a PCN reaction that required hospitalization: No Has patient had a PCN reaction occurring within the last 10 years: No   . Vicodin [Hydrocodone-Acetaminophen] Other (See Comments)    Sped up heart and breathing    ROS Review of Systems  Constitutional: Negative.    HENT: Negative.   Eyes: Negative.   Respiratory: Negative.   Cardiovascular: Negative.   Gastrointestinal: Negative.   Endocrine: Negative.   Genitourinary: Negative.   Musculoskeletal: Negative.   Skin: Negative.   Allergic/Immunologic: Negative.   Neurological: Positive for light-headedness.  Hematological: Negative.   Psychiatric/Behavioral: Negative.       Objective:    Physical Exam Vitals and nursing note reviewed.  Constitutional:      Appearance: Normal appearance.  HENT:     Head: Normocephalic and atraumatic.     Right Ear: External ear normal.     Left Ear: External ear normal.     Nose: Nose normal.     Mouth/Throat:     Mouth: Mucous membranes are moist.     Pharynx: Oropharynx is clear.  Eyes:     Extraocular Movements: Extraocular movements intact.     Conjunctiva/sclera: Conjunctivae normal.     Pupils: Pupils are equal, round, and reactive to light.  Cardiovascular:     Rate and Rhythm: Normal rate and regular rhythm.     Pulses: Normal pulses.     Heart sounds: Normal heart sounds.  Pulmonary:     Effort: Pulmonary effort is normal.     Breath sounds: Normal breath sounds.  Abdominal:     General: Abdomen is flat. Bowel sounds are normal.     Palpations: Abdomen is soft.  Musculoskeletal:        General: Normal range of motion.     Cervical back: Normal range of motion and neck supple.  Skin:    General: Skin is warm and dry.  Neurological:     General: No focal deficit present.     Mental Status: She is alert and oriented to person, place, and time.  Psychiatric:        Mood and Affect: Mood  normal.        Behavior: Behavior normal.        Thought Content: Thought content normal.        Judgment: Judgment normal.     BP (!) 144/68 (BP Location: Left Arm, Patient Position: Sitting, Cuff Size: Normal)   Pulse 60   Temp 98.7 F (37.1 C) (Oral)   Resp 18   Ht 5\' 4"  (1.626 m)   Wt 130 lb (59 kg)   LMP  (LMP Unknown)   BMI 22.31 kg/m   Wt Readings from Last 3 Encounters:  12/17/19 130 lb (59 kg)  12/02/19 132 lb 4.8 oz (60 kg)  12/02/19 129 lb 12.8 oz (58.9 kg)     Health Maintenance Due  Topic Date Due  . Hepatitis C Screening  Never done  . COVID-19 Vaccine (1) Never done  . COLONOSCOPY  Never done  . MAMMOGRAM  07/27/2013    There are no preventive care reminders to display for this patient.  Lab Results  Component Value Date   TSH 1.774 06/13/2019   Lab Results  Component Value Date   WBC 4.2 12/02/2019   HGB 12.3 12/02/2019   HCT 39.5 12/02/2019   MCV 81.1 12/02/2019   PLT 197 12/02/2019   Lab Results  Component Value Date   NA 137 12/09/2019   K 4.9 12/09/2019   CO2 24 12/09/2019   GLUCOSE 93 12/09/2019   BUN 16 12/09/2019   CREATININE 1.08 (H) 12/09/2019   BILITOT 0.4 12/02/2019   ALKPHOS 70 12/02/2019   AST 28 12/02/2019   ALT 29 12/02/2019   PROT 7.7 12/02/2019   ALBUMIN 4.1 12/02/2019   CALCIUM 9.7 12/09/2019   ANIONGAP 9 12/02/2019   Lab Results  Component Value Date   CHOL 119 08/15/2019   Lab Results  Component Value Date   HDL 66 08/15/2019   Lab Results  Component Value Date   LDLCALC 44 08/15/2019   Lab Results  Component Value Date   TRIG 31 08/15/2019   Lab Results  Component Value Date   CHOLHDL 1.8 08/15/2019   Lab Results  Component Value Date   HGBA1C 5.5 06/13/2019      Assessment & Plan:   Problem List Items Addressed This Visit      Cardiovascular and Mediastinum   Hypertension - Primary    1. Essential hypertension   Encouraged patient to continue monitoring blood pressure on a daily basis, keep written log, increase hydrochlorothiazide to 25 mg, call cardiology in 2 weeks with results.  Encouraged to increase hydration.  Is due to follow-up with cancer center at end of July, CMP has already been ordered, encouraged patient to keep appointment so kidney function can be followed.   I have reviewed the patient's medical history (PMH, PSH,  Social History, Family History, Medications, and allergies) , and have been updated if relevant. I spent 20 minutes reviewing chart and  face to face time with patient.     No orders of the defined types were placed in this encounter.   Follow-up: Return if symptoms worsen or fail to improve.    Loraine Grip Mayers, PA-C

## 2019-12-18 NOTE — Patient Instructions (Signed)
I encourage you to do a trial of hydrochlorothiazide, 25 mg, check blood pressure regularly, keep written log, follow-up with cardiology within 2 weeks with results.  Make sure to stay well-hydrated  Please let us know if there is anything else that we can do for you at this time  Kennieth Rad, PA-C Physician Assistant [place] [email or web address]

## 2019-12-25 ENCOUNTER — Other Ambulatory Visit: Payer: Self-pay

## 2019-12-25 ENCOUNTER — Telehealth: Payer: Self-pay

## 2019-12-25 DIAGNOSIS — E7800 Pure hypercholesterolemia, unspecified: Secondary | ICD-10-CM

## 2019-12-25 NOTE — Telephone Encounter (Signed)
Pt called into reschedule a visit with pharmd for lipids and we moved her to the chst location so that she may be seen sooner

## 2019-12-29 DIAGNOSIS — E7800 Pure hypercholesterolemia, unspecified: Secondary | ICD-10-CM | POA: Diagnosis not present

## 2019-12-29 LAB — LIPID PANEL
Chol/HDL Ratio: 3.1 ratio (ref 0.0–4.4)
Cholesterol, Total: 183 mg/dL (ref 100–199)
HDL: 60 mg/dL (ref >39)
LDL Chol Calc (NIH): 109 mg/dL — ABNORMAL HIGH (ref 0–99)
Triglycerides: 77 mg/dL (ref 0–149)
VLDL Cholesterol Cal: 14 mg/dL (ref 5–40)

## 2020-01-05 ENCOUNTER — Ambulatory Visit (INDEPENDENT_AMBULATORY_CARE_PROVIDER_SITE_OTHER): Payer: Medicare HMO | Admitting: Pharmacist

## 2020-01-05 ENCOUNTER — Other Ambulatory Visit: Payer: Self-pay

## 2020-01-05 DIAGNOSIS — E7800 Pure hypercholesterolemia, unspecified: Secondary | ICD-10-CM

## 2020-01-05 MED ORDER — ROSUVASTATIN CALCIUM 5 MG PO TABS: ORAL_TABLET | ORAL | 3 refills | Status: DC

## 2020-01-05 NOTE — Progress Notes (Addendum)
Patient ID: Allison [name]                 DOB: [birth year]                    MRN: [id number]     HPI: Allison [name] is a 69 y.o. female patient referred to lipid clinic by Dr.Schumann. PMH is significant for CAD status post DES to RCA 06/16/19, stage IV lung cancer, hypertension and hyperlipidemia.  Her atorvastatin was discontinued due to transaminitis, she was also tried on pravastatin but again developed transaminitis and has been discontinued.  Patient presents today to lipid clinic. Patient is a very pleasant 69 year old female. She states that she not only had increased liver enzymes on statins, but she also had issues with leg cramps. Has not had any leg cramps in about a month. She is very active. Battling lung cancer. Has an MRI soon to rule out brain mets. HCTZ recently increased to 25mg  daily. Her BP from June 15-Jul 15 BP ranged Systolic 956-213, Diastolic 08-65. She has not had any APAP in a long time. Gets head aches but does not take anything, headaches come and go. Does not drink ETOH often.  Current Medications: zetia 10mg  daily Intolerances: atorvastatin (cramps, LFT elevations) pravastatin (cramps, LFT elevations) Risk Factors: NSTEMI LDL goal: <70  Diet: not addressed this visit  Exercise: line dancing (Mon, Wed), [place] chi, walking, resistance training with machines at [employer], bike riding, sliver sneakers app- works out usually 4 days a week  Family History: The patient's family history includes Heart disease in her brother and sister; Lung cancer in an other family member.  Social History:  ocassional ETOH, less than weekly  Labs:12/29/19 TC 183, TG 77, HDL 60, LDL 109 (zetia 10mg ) 08/15/19 ALT 91 AST 52  Past Medical History:  Diagnosis Date  . Anxiety   . Arthritis   . Asthma    exercise induced  . Depression    PMH  . GERD (gastroesophageal reflux disease)   . Glaucoma   . Hypertension   . lung ca dx'd 11/2017   right  . Malignant pleural effusion     right  . PONV (postoperative nausea and vomiting)   . Pre-diabetes   . Raynaud's disease   . Raynaud's disease     Current Outpatient Medications on File Prior to Visit  Medication Sig Dispense Refill  . acetaminophen (TYLENOL) 500 MG tablet Take 1,000 mg by mouth every 6 (six) hours as needed (pain).     . Acetylcysteine (N-ACETYL-L-CYSTEINE PO) Take 1 tablet by mouth 2 (two) times daily.    Marland Kitchen ALPRAZolam (XANAX) 0.25 MG tablet Take 0.25 mg by mouth 2 (two) times daily as needed for anxiety.     Marland Kitchen amLODipine (NORVASC) 5 MG tablet Take 5 mg by mouth daily.    Marland Kitchen aspirin 81 MG chewable tablet Chew 1 tablet (81 mg total) by mouth daily.    . Biotin 1000 MCG CHEW Chew 10 mg by mouth daily.    . carvedilol (COREG) 6.25 MG tablet Take 1 tablet (6.25 mg total) by mouth 2 (two) times daily with a meal. 180 tablet 3  . clopidogrel (PLAVIX) 75 MG tablet Take 1 tablet (75 mg total) by mouth daily with breakfast. 90 tablet 3  . dorzolamide-timolol (COSOPT) 22.3-6.8 MG/ML ophthalmic solution Instill 1 drop in both eyes twice daily    . ezetimibe (ZETIA) 10 MG tablet Take 10 mg by mouth  daily.     . hydrochlorothiazide (MICROZIDE) 12.5 MG capsule Take 1 capsule (12.5 mg total) by mouth daily. 90 capsule 3  . latanoprost (XALATAN) 0.005 % ophthalmic solution Place 1 drop into both eyes at bedtime.     . Melatonin 5 MG CAPS Take 10 mg by mouth at bedtime as needed (sleep).    . nitroGLYCERIN (NITROSTAT) 0.4 MG SL tablet Place 1 tablet (0.4 mg total) under the tongue every 5 (five) minutes x 3 doses as needed for chest pain. 30 tablet 12  . osimertinib mesylate (TAGRISSO) 80 MG tablet Take 1 tablet (80 mg total) by mouth daily. 30 tablet 3  . traMADol (ULTRAM) 50 MG tablet Take 50 mg by mouth 2 (two) times daily as needed.    Marland Kitchen VITAMIN D, CHOLECALCIFEROL, PO Take 5,000 Units by mouth daily.      No current facility-administered medications on file prior to visit.    Allergies  Allergen Reactions  .  Penicillins Other (See Comments)    SYNCOPE PATIENT HAS HAD A PCN REACTION WITH IMMEDIATE RASH, FACIAL/TONGUE/THROAT SWELLING, SOB, OR LIGHTHEADEDNESS WITH HYPOTENSION:  #  #  YES  #  # Has patient had a PCN reaction causing severe rash involving mucus membranes or skin necrosis: No Has patient had a PCN reaction that required hospitalization: No Has patient had a PCN reaction occurring within the last 10 years: No   . Vicodin [Hydrocodone-Acetaminophen] Other (See Comments)    Sped up heart and breathing    Assessment/Plan:  1. Hyperlipidemia - LDL is above goal of <70. We did discuss that LFT never did go above 3X ULN. I presented several medication options to patient: #1. Trial of rosuvastatin since it has less incidence of LFT elevation and muscle issues. Potential side effects and monitoring reviewed. #2. Try PCSK9i- dosing, cost and side effects reviewed.  Patient was agreeable to trying rosuvastatin. We did discuss several dosing options and decided to start low and increase as tolerated. Will start with rosuvastatin 5mg  every other day and increase to daily if tolerating after 2 weeks. She will have CMP done next Monday with oncology. Will also check LFT and lipid panel in 5 weeks at [facility] office. Will follow LFT closely and will stop if >3X UNL.  Thank you,  Ramond Dial, Pharm.D, BCPS, CPP [place]  [address], [place], [place] [address]  Phone: [phone]; Fax: [phone]

## 2020-01-05 NOTE — Patient Instructions (Addendum)
It was a pleasure to meet you!  Please start taking rosuvastatin 5mg  every other day. If you are doing well after 2 weeks, please increase to 1 tablet daily. Continue Zetia 10mg  daily  Recheck labs on 8/23 at the [facility] office- these will be fasting labs  Call us at [phone] with any questions or concerns

## 2020-01-07 DIAGNOSIS — R002 Palpitations: Secondary | ICD-10-CM | POA: Diagnosis not present

## 2020-01-08 ENCOUNTER — Telehealth: Payer: Self-pay | Admitting: Pharmacist

## 2020-01-08 NOTE — Telephone Encounter (Signed)
Patient called stating she started taking rosuvastatin 5mg  every other day on Monday and woke up today with cramps in her legs. Wants to know what to do to help the cramps. Her K and Mg were normal in the end of June. I advised that she could try the old wives tails of mustard or pickle jucie (no evidence and her electrolytes were normal when last checked) she could also try magnesium spray. Also suggested that we decrease the frequency of rosuvastatin to twice a week. She will start taking on Tue and Sat. She will call with any further issues.

## 2020-01-09 ENCOUNTER — Ambulatory Visit (HOSPITAL_COMMUNITY)
Admission: RE | Admit: 2020-01-09 | Discharge: 2020-01-09 | Disposition: A | Discharge status: Home / Self Care | Payer: Medicare HMO | Source: Ambulatory Visit | Attending: Internal Medicine | Admitting: Internal Medicine

## 2020-01-09 ENCOUNTER — Other Ambulatory Visit: Payer: Self-pay

## 2020-01-09 DIAGNOSIS — C3490 Malignant neoplasm of unspecified part of unspecified bronchus or lung: Secondary | ICD-10-CM | POA: Diagnosis not present

## 2020-01-09 DIAGNOSIS — D329 Benign neoplasm of meninges, unspecified: Secondary | ICD-10-CM | POA: Diagnosis not present

## 2020-01-09 DIAGNOSIS — I6782 Cerebral ischemia: Secondary | ICD-10-CM | POA: Diagnosis not present

## 2020-01-09 MED ORDER — GADOBUTROL 1 MMOL/ML IV SOLN
5.0000 mL | Freq: Once | INTRAVENOUS | Status: AC | PRN
  Administered 2020-01-09: 5 mL via INTRAVENOUS

## 2020-01-12 ENCOUNTER — Ambulatory Visit (HOSPITAL_COMMUNITY)
Admission: RE | Admit: 2020-01-12 | Discharge: 2020-01-12 | Disposition: A | Discharge status: Home / Self Care | Payer: Medicare HMO | Source: Ambulatory Visit | Attending: Internal Medicine | Admitting: Internal Medicine

## 2020-01-12 ENCOUNTER — Encounter (HOSPITAL_COMMUNITY): Payer: Self-pay

## 2020-01-12 ENCOUNTER — Other Ambulatory Visit: Payer: Self-pay

## 2020-01-12 ENCOUNTER — Inpatient Hospital Stay: Payer: Medicare HMO | Attending: Internal Medicine

## 2020-01-12 DIAGNOSIS — C771 Secondary and unspecified malignant neoplasm of intrathoracic lymph nodes: Secondary | ICD-10-CM | POA: Insufficient documentation

## 2020-01-12 DIAGNOSIS — I6782 Cerebral ischemia: Secondary | ICD-10-CM | POA: Diagnosis not present

## 2020-01-12 DIAGNOSIS — Z79899 Other long term (current) drug therapy: Secondary | ICD-10-CM | POA: Insufficient documentation

## 2020-01-12 DIAGNOSIS — M1990 Unspecified osteoarthritis, unspecified site: Secondary | ICD-10-CM | POA: Insufficient documentation

## 2020-01-12 DIAGNOSIS — K219 Gastro-esophageal reflux disease without esophagitis: Secondary | ICD-10-CM | POA: Insufficient documentation

## 2020-01-12 DIAGNOSIS — F329 Major depressive disorder, single episode, unspecified: Secondary | ICD-10-CM | POA: Insufficient documentation

## 2020-01-12 DIAGNOSIS — R69 Illness, unspecified: Secondary | ICD-10-CM | POA: Diagnosis not present

## 2020-01-12 DIAGNOSIS — Z7982 Long term (current) use of aspirin: Secondary | ICD-10-CM | POA: Diagnosis not present

## 2020-01-12 DIAGNOSIS — I7300 Raynaud's syndrome without gangrene: Secondary | ICD-10-CM | POA: Insufficient documentation

## 2020-01-12 DIAGNOSIS — E041 Nontoxic single thyroid nodule: Secondary | ICD-10-CM | POA: Insufficient documentation

## 2020-01-12 DIAGNOSIS — C3490 Malignant neoplasm of unspecified part of unspecified bronchus or lung: Secondary | ICD-10-CM | POA: Diagnosis not present

## 2020-01-12 DIAGNOSIS — J439 Emphysema, unspecified: Secondary | ICD-10-CM | POA: Insufficient documentation

## 2020-01-12 DIAGNOSIS — I10 Essential (primary) hypertension: Secondary | ICD-10-CM | POA: Insufficient documentation

## 2020-01-12 DIAGNOSIS — J45909 Unspecified asthma, uncomplicated: Secondary | ICD-10-CM | POA: Insufficient documentation

## 2020-01-12 DIAGNOSIS — Z955 Presence of coronary angioplasty implant and graft: Secondary | ICD-10-CM | POA: Insufficient documentation

## 2020-01-12 DIAGNOSIS — I700 Atherosclerosis of aorta: Secondary | ICD-10-CM | POA: Diagnosis not present

## 2020-01-12 DIAGNOSIS — C3411 Malignant neoplasm of upper lobe, right bronchus or lung: Secondary | ICD-10-CM | POA: Diagnosis not present

## 2020-01-12 DIAGNOSIS — I471 Supraventricular tachycardia: Secondary | ICD-10-CM | POA: Diagnosis not present

## 2020-01-12 DIAGNOSIS — J910 Malignant pleural effusion: Secondary | ICD-10-CM | POA: Diagnosis not present

## 2020-01-12 LAB — CBC WITH DIFFERENTIAL (CANCER CENTER ONLY)
Abs Immature Granulocytes: 0.01 10*3/uL (ref 0.00–0.07)
Basophils Absolute: 0 10*3/uL (ref 0.0–0.1)
Basophils Relative: 1 %
Eosinophils Absolute: 0.1 10*3/uL (ref 0.0–0.5)
Eosinophils Relative: 2 %
HCT: 38 % (ref 36.0–46.0)
Hemoglobin: 12 g/dL (ref 12.0–15.0)
Immature Granulocytes: 0 %
Lymphocytes Relative: 43 %
Lymphs Abs: 1.8 10*3/uL (ref 0.7–4.0)
MCH: 26 pg (ref 26.0–34.0)
MCHC: 31.6 g/dL (ref 30.0–36.0)
MCV: 82.4 fL (ref 80.0–100.0)
Monocytes Absolute: 0.4 10*3/uL (ref 0.1–1.0)
Monocytes Relative: 10 %
Neutro Abs: 1.8 10*3/uL (ref 1.7–7.7)
Neutrophils Relative %: 44 %
Platelet Count: 199 10*3/uL (ref 150–400)
RBC: 4.61 MIL/uL (ref 3.87–5.11)
RDW: 14.5 % (ref 11.5–15.5)
WBC Count: 4.2 10*3/uL (ref 4.0–10.5)
nRBC: 0 % (ref 0.0–0.2)

## 2020-01-12 LAB — CMP (CANCER CENTER ONLY)
ALT: 42 U/L (ref 0–44)
AST: 34 U/L (ref 15–41)
Albumin: 4 g/dL (ref 3.5–5.0)
Alkaline Phosphatase: 65 U/L (ref 38–126)
Anion gap: 10 (ref 5–15)
BUN: 14 mg/dL (ref 8–23)
CO2: 28 mmol/L (ref 22–32)
Calcium: 10.3 mg/dL (ref 8.9–10.3)
Chloride: 104 mmol/L (ref 98–111)
Creatinine: 1.03 mg/dL — ABNORMAL HIGH (ref 0.44–1.00)
GFR, Est AFR Am: >60 mL/min (ref >60)
GFR, Estimated: 56 mL/min — ABNORMAL LOW (ref >60)
Glucose, Bld: 82 mg/dL (ref 70–99)
Potassium: 3.9 mmol/L (ref 3.5–5.1)
Sodium: 142 mmol/L (ref 135–145)
Total Bilirubin: 0.3 mg/dL (ref 0.3–1.2)
Total Protein: 7.8 g/dL (ref 6.5–8.1)

## 2020-01-12 MED ORDER — IOHEXOL 300 MG/ML  SOLN
100.0000 mL | Freq: Once | INTRAMUSCULAR | Status: AC | PRN
  Administered 2020-01-12: 100 mL via INTRAVENOUS

## 2020-01-12 MED ORDER — SODIUM CHLORIDE (PF) 0.9 % IJ SOLN
INTRAMUSCULAR | Status: AC
  Filled 2020-01-12: qty 50

## 2020-01-13 ENCOUNTER — Encounter: Payer: Self-pay | Admitting: Internal Medicine

## 2020-01-13 ENCOUNTER — Inpatient Hospital Stay: Payer: Medicare HMO | Admitting: Internal Medicine

## 2020-01-13 ENCOUNTER — Other Ambulatory Visit: Payer: Self-pay

## 2020-01-13 ENCOUNTER — Telehealth: Payer: Self-pay | Admitting: Internal Medicine

## 2020-01-13 VITALS — BP 124/67 | HR 60 | Temp 97.8°F | Resp 18 | Ht 64.0 in | Wt 130.9 lb

## 2020-01-13 DIAGNOSIS — I10 Essential (primary) hypertension: Secondary | ICD-10-CM | POA: Diagnosis not present

## 2020-01-13 DIAGNOSIS — C3491 Malignant neoplasm of unspecified part of right bronchus or lung: Secondary | ICD-10-CM | POA: Diagnosis not present

## 2020-01-13 DIAGNOSIS — Z5111 Encounter for antineoplastic chemotherapy: Secondary | ICD-10-CM

## 2020-01-13 DIAGNOSIS — C3411 Malignant neoplasm of upper lobe, right bronchus or lung: Secondary | ICD-10-CM | POA: Diagnosis not present

## 2020-01-13 NOTE — Progress Notes (Signed)
[facility] [phone]   [phone]  OFFICE PROGRESS NOTE  [name], [name] [address]  DIAGNOSIS: Stage IV (T2 a,N2, M1a) non-small cell lung cancer, adenocarcinoma diagnosed in July 2019 and presented with right upper lobe lung mass in addition to mediastinal lymphadenopathy as well as bilateral pulmonary nodules and malignant right pleural effusion.  Biomarker Findings Microsatellite status - Cannot Be Determined Tumor Mutational Burden - Cannot Be Determined Genomic Findings For a complete list of the genes assayed, please refer to the Appendix. EGFR exon 19 deletion ([id number]) TP53 Y220C 7 Disease relevant genes with no reportable alterations: KRAS, ALK, BRAF, MET, RET, ERBB2, ROS1   PRIOR THERAPY: Status post right Pleurx catheter placement by Dr. Prescott Gum for drainage of malignant right pleural effusion.  CURRENT THERAPY: Tagrisso 80 mg p.o. daily.  First dose was given on 01/29/2018.  Status post 23 months of treatment.  INTERVAL HISTORY: Allison [name] 69 y.o. female returns to the clinic today for follow-up visit. The patient is feeling fine today with no concerning complaints except for an anxiety about her scan results. She denied having any current chest pain, shortness of breath, cough or hemoptysis. She denied having any fever or chills. She has no nausea, vomiting, diarrhea or constipation. She denied having any headache or visual changes. The patient has no weight loss or night sweats. She is very active and helping the advocacy group for lung cancer. She had repeat CT scan of the chest, abdomen pelvis as well as MRI of the brain performed recently and she is here for evaluation and discussion of her risk her results.  MEDICAL HISTORY: Past Medical History:  Diagnosis Date  . Anxiety   . Arthritis   . Asthma    exercise induced  . Depression    PMH  . GERD (gastroesophageal  reflux disease)   . Glaucoma   . Hypertension   . lung ca dx'd 11/2017   right  . Malignant pleural effusion    right  . PONV (postoperative nausea and vomiting)   . Pre-diabetes   . Raynaud's disease   . Raynaud's disease     ALLERGIES:  is allergic to penicillins and vicodin [hydrocodone-acetaminophen].  MEDICATIONS:  Current Outpatient Medications  Medication Sig Dispense Refill  . acetaminophen (TYLENOL) 500 MG tablet Take 1,000 mg by mouth every 6 (six) hours as needed (pain).  (Patient not taking: Reported on 01/05/2020)    . Acetylcysteine (N-ACETYL-L-CYSTEINE PO) Take 1 tablet by mouth 2 (two) times daily.    Marland Kitchen ALPRAZolam (XANAX) 0.25 MG tablet Take 0.25 mg by mouth 2 (two) times daily as needed for anxiety.     Marland Kitchen amLODipine (NORVASC) 5 MG tablet Take 5 mg by mouth daily.    Marland Kitchen aspirin 81 MG chewable tablet Chew 1 tablet (81 mg total) by mouth daily.    . Biotin 1000 MCG CHEW Chew 10 mg by mouth daily.    . carvedilol (COREG) 6.25 MG tablet Take 1 tablet (6.25 mg total) by mouth 2 (two) times daily with a meal. 180 tablet 3  . clopidogrel (PLAVIX) 75 MG tablet Take 1 tablet (75 mg total) by mouth daily with breakfast. 90 tablet 3  . dorzolamide-timolol (COSOPT) 22.3-6.8 MG/ML ophthalmic solution Instill 1 drop in both eyes twice daily    . ezetimibe (ZETIA) 10 MG tablet Take 10 mg by mouth daily.     . hydrochlorothiazide (HYDRODIURIL) 25  MG tablet Take 25 mg by mouth daily. Currently taking 2 of the 12.22m daily    . latanoprost (XALATAN) 0.005 % ophthalmic solution Place 1 drop into both eyes at bedtime.     . nitroGLYCERIN (NITROSTAT) 0.4 MG SL tablet Place 1 tablet (0.4 mg total) under the tongue every 5 (five) minutes x 3 doses as needed for chest pain. 30 tablet 12  . osimertinib mesylate (TAGRISSO) 80 MG tablet Take 1 tablet (80 mg total) by mouth daily. 30 tablet 3  . rosuvastatin (CRESTOR) 5 MG tablet Take one tablet every other day for 2 weeks, if you feel good increase  to 1 tablet daily. 90 tablet 3  . traMADol (ULTRAM) 50 MG tablet Take 50 mg by mouth 2 (two) times daily as needed. (Patient not taking: Reported on 01/05/2020)    . VITAMIN D, CHOLECALCIFEROL, PO Take 5,000 Units by mouth daily.      No current facility-administered medications for this visit.    SURGICAL HISTORY:  Past Surgical History:  Procedure Laterality Date  . ABDOMINAL HYSTERECTOMY     partial  . CHEST TUBE INSERTION Right 01/01/2018   Procedure: INSERTION PLEURAL DRAINAGE CATHETER;  Surgeon: VIvin Poot MD;  [facility] [facility]  Service: Thoracic;  Laterality: Right;  . COLONOSCOPY    . CORONARY STENT INTERVENTION N/A 06/16/2019   Procedure: CORONARY STENT INTERVENTION;  Surgeon: VJettie Booze MD;  [facility];  Service: Cardiovascular;  Laterality: N/A;  . DILATION AND CURETTAGE OF UTERUS    . EYE SURGERY     due to Glaucoma  . LEFT HEART CATH AND CORONARY ANGIOGRAPHY N/A 06/16/2019   Procedure: LEFT HEART CATH AND CORONARY ANGIOGRAPHY;  Surgeon: VJettie Booze MD;  [facility];  Service: Cardiovascular;  Laterality: N/A;  . REMOVAL OF PLEURAL DRAINAGE CATHETER Right 11/07/2018   Procedure: REMOVAL OF PLEURAL DRAINAGE CATHETER;  Surgeon: VIvin Poot MD;  [place] [facility]  Service: Thoracic;  Laterality: Right;  . ROTATOR CUFF REPAIR    . TUBAL LIGATION    . WISDOM TOOTH EXTRACTION      REVIEW OF SYSTEMS:  Constitutional: negative Eyes: negative Ears, nose, mouth, throat, and face: negative Respiratory: negative Cardiovascular: negative Gastrointestinal: negative Genitourinary:negative Integument/breast: negative Hematologic/lymphatic: negative Musculoskeletal:negative Neurological: negative Behavioral/Psych: negative Endocrine: negative Allergic/Immunologic: negative   PHYSICAL EXAMINATION: General appearance: alert, cooperative and no distress Head: Normocephalic, without obvious abnormality,  atraumatic Neck: no adenopathy, no JVD, supple, symmetrical, trachea midline and thyroid not enlarged, symmetric, no tenderness/mass/nodules Lymph nodes: Cervical, supraclavicular, and axillary nodes normal. Resp: clear to auscultation bilaterally Back: symmetric, no curvature. ROM normal. No CVA tenderness. Cardio: regular rate and rhythm, S1, S2 normal, no murmur, click, rub or gallop GI: soft, non-tender; bowel sounds normal; no masses,  no organomegaly Extremities: extremities normal, atraumatic, no cyanosis or edema Neurologic: Alert and oriented X 3, normal strength and tone. Normal symmetric reflexes. Normal coordination and gait  ECOG PERFORMANCE STATUS: 1 - Symptomatic but completely ambulatory  Blood pressure 124/67, pulse 60, temperature 97.8 F (36.6 C), temperature source Temporal, resp. rate 18, height '5\' 4"'  (1.626 m), weight 130 lb 14.4 oz (59.4 kg), SpO2 100 %.  LABORATORY DATA: Lab Results  Component Value Date   WBC 4.2 01/12/2020   HGB 12.0 01/12/2020   HCT 38.0 01/12/2020   MCV 82.4 01/12/2020   PLT 199 01/12/2020      Chemistry      Component Value Date/Time  NA 142 01/12/2020 0912   NA 137 12/09/2019 0818   K 3.9 01/12/2020 0912   CL 104 01/12/2020 0912   CO2 28 01/12/2020 0912   BUN 14 01/12/2020 0912   BUN 16 12/09/2019 0818   CREATININE 1.03 (H) 01/12/2020 0912      Component Value Date/Time   CALCIUM 10.3 01/12/2020 0912   ALKPHOS 65 01/12/2020 0912   AST 34 01/12/2020 0912   ALT 42 01/12/2020 0912   BILITOT 0.3 01/12/2020 0912       RADIOGRAPHIC STUDIES: CT Chest W Contrast  Result Date: 01/12/2020 CLINICAL DATA:  Non-small-cell lung cancer.  Restaging. EXAM: CT CHEST, ABDOMEN, AND PELVIS WITH CONTRAST TECHNIQUE: Multidetector CT imaging of the chest, abdomen and pelvis was performed following the standard protocol during bolus administration of intravenous contrast. CONTRAST:  126m OMNIPAQUE IOHEXOL 300 MG/ML  SOLN COMPARISON:   09/29/2019 FINDINGS: CT CHEST FINDINGS Cardiovascular: The heart size is normal. No substantial pericardial effusion. Coronary artery calcification is evident. Atherosclerotic calcification is noted in the wall of the thoracic aorta. Mediastinum/Nodes: Tiny bilateral thyroid nodules are stable. No follow-up recommended (ref: J Am Coll Radiol. 2015 Feb;12(2): 143-50).No mediastinal lymphadenopathy. There is no hilar lymphadenopathy. The esophagus has normal imaging features. There is no axillary lymphadenopathy. Lungs/Pleura: Centrilobular emphsyema noted. Right upper lobe pulmonary nodule measured previously at 2.6 x 1.7 cm measures 2.5 x 1.7 cm today. 8 mm perifissural right lower lobe nodule is stable. No new suspicious pulmonary nodule or mass. Trace right pleural effusion. Musculoskeletal: No worrisome lytic or sclerotic osseous abnormality. CT ABDOMEN PELVIS FINDINGS Hepatobiliary: No suspicious focal abnormality within the liver parenchyma. Gallbladder decompressed. No intrahepatic or extrahepatic biliary dilation. Pancreas: No focal mass lesion. No dilatation of the main duct. No intraparenchymal cyst. No peripancreatic edema. Spleen: No splenomegaly. No focal mass lesion. Adrenals/Urinary Tract: No adrenal nodule or mass. Tiny hypodensities in both kidneys are too small to characterize but are similar and likely benign cyst. No evidence for hydroureter. The urinary bladder appears normal for the degree of distention. Stomach/Bowel: Stomach is unremarkable. No gastric wall thickening. No evidence of outlet obstruction. Duodenum is normally positioned as is the ligament of Treitz. No small bowel wall thickening. No small bowel dilatation. The terminal ileum is normal. No gross colonic mass. No colonic wall thickening. Vascular/Lymphatic: There is abdominal aortic atherosclerosis without aneurysm. There is no gastrohepatic or hepatoduodenal ligament lymphadenopathy. No retroperitoneal or mesenteric  lymphadenopathy. No pelvic sidewall lymphadenopathy. Reproductive: The uterus is surgically absent. There is no adnexal mass. Other: No intraperitoneal free fluid. Musculoskeletal: No worrisome lytic or sclerotic osseous abnormality. IMPRESSION: 1. Stable exam right upper and lower lobe pulmonary nodules are unchanged. No new or progressive findings in the chest, abdomen, or pelvis to suggest metastatic disease. 2. Trace right pleural effusion. 3. Aortic Atherosclerosis (ICD10-I70.0) and Emphysema (ICD10-J43.9). Electronically Signed   By: EMisty StanleyM.D.   On: 01/12/2020 12:08   MR Brain W Wo Contrast  Result Date: 01/10/2020 CLINICAL DATA:  Non-small cell lung cancer staging EXAM: MRI HEAD WITHOUT AND WITH CONTRAST TECHNIQUE: Multiplanar, multiecho pulse sequences of the brain and surrounding structures were obtained without and with intravenous contrast. CONTRAST:  564mGADAVIST GADOBUTROL 1 MMOL/ML IV SOLN COMPARISON:  01/10/2018 FINDINGS: Brain: 6 mm meningioma from the right petrous ridge, stable from 2019. 2 mm nodular focus of enhancement along the right frontal pole on 16:86, subtle on prior but convincingly present previously. No new area of abnormal enhancement or swelling to suggest metastatic  disease. Mild chronic small vessel ischemia in the cerebral white matter. No infarct, hemorrhage, hydrocephalus, or collection. Normal brain volume. Vascular: Normal flow voids and vascular enhancements. Skull and upper cervical spine: No evidence of metastatic disease to the bones. Sinuses/Orbits: Negative IMPRESSION: 1. Negative for metastatic disease. 2. Two subcentimeter meningiomas also seen in 2019. Electronically Signed   By: Monte Fantasia M.D.   On: 01/10/2020 16:19   CT Abdomen Pelvis W Contrast  Result Date: 01/12/2020 CLINICAL DATA:  Non-small-cell lung cancer.  Restaging. EXAM: CT CHEST, ABDOMEN, AND PELVIS WITH CONTRAST TECHNIQUE: Multidetector CT imaging of the chest, abdomen and pelvis  was performed following the standard protocol during bolus administration of intravenous contrast. CONTRAST:  184m OMNIPAQUE IOHEXOL 300 MG/ML  SOLN COMPARISON:  09/29/2019 FINDINGS: CT CHEST FINDINGS Cardiovascular: The heart size is normal. No substantial pericardial effusion. Coronary artery calcification is evident. Atherosclerotic calcification is noted in the wall of the thoracic aorta. Mediastinum/Nodes: Tiny bilateral thyroid nodules are stable. No follow-up recommended (ref: J Am Coll Radiol. 2015 Feb;12(2): 143-50).No mediastinal lymphadenopathy. There is no hilar lymphadenopathy. The esophagus has normal imaging features. There is no axillary lymphadenopathy. Lungs/Pleura: Centrilobular emphsyema noted. Right upper lobe pulmonary nodule measured previously at 2.6 x 1.7 cm measures 2.5 x 1.7 cm today. 8 mm perifissural right lower lobe nodule is stable. No new suspicious pulmonary nodule or mass. Trace right pleural effusion. Musculoskeletal: No worrisome lytic or sclerotic osseous abnormality. CT ABDOMEN PELVIS FINDINGS Hepatobiliary: No suspicious focal abnormality within the liver parenchyma. Gallbladder decompressed. No intrahepatic or extrahepatic biliary dilation. Pancreas: No focal mass lesion. No dilatation of the main duct. No intraparenchymal cyst. No peripancreatic edema. Spleen: No splenomegaly. No focal mass lesion. Adrenals/Urinary Tract: No adrenal nodule or mass. Tiny hypodensities in both kidneys are too small to characterize but are similar and likely benign cyst. No evidence for hydroureter. The urinary bladder appears normal for the degree of distention. Stomach/Bowel: Stomach is unremarkable. No gastric wall thickening. No evidence of outlet obstruction. Duodenum is normally positioned as is the ligament of Treitz. No small bowel wall thickening. No small bowel dilatation. The terminal ileum is normal. No gross colonic mass. No colonic wall thickening. Vascular/Lymphatic: There is  abdominal aortic atherosclerosis without aneurysm. There is no gastrohepatic or hepatoduodenal ligament lymphadenopathy. No retroperitoneal or mesenteric lymphadenopathy. No pelvic sidewall lymphadenopathy. Reproductive: The uterus is surgically absent. There is no adnexal mass. Other: No intraperitoneal free fluid. Musculoskeletal: No worrisome lytic or sclerotic osseous abnormality. IMPRESSION: 1. Stable exam right upper and lower lobe pulmonary nodules are unchanged. No new or progressive findings in the chest, abdomen, or pelvis to suggest metastatic disease. 2. Trace right pleural effusion. 3. Aortic Atherosclerosis (ICD10-I70.0) and Emphysema (ICD10-J43.9). Electronically Signed   By: EMisty StanleyM.D.   On: 01/12/2020 12:08   LONG TERM MONITOR (3-14 DAYS)  Result Date: 01/12/2020  3 episodes of NSVT, longest lasting 7 beats.  17 episodes of SVT, longest lasting 15.5 seconds.  Patient triggered events corresponded to sinus rhythm  PACs/PVCs and an episode of SVT  13 days of data recorded on Zio monitor. Patient had a min HR of 48 bpm, max HR of 176 bpm, and avg HR of 67 bpm. Predominant underlying rhythm was Sinus Rhythm. No atrial fibrillation, high degree block, or pauses noted.  3 episodes of NSVT, longest lasting 7 beats.  17 episodes of SVT, longest lasting 15.5 seconds.  Isolated atrial and ventricular ectopy was rare (<1%). There were 9 triggered events, which  corresponded to sinus rhythm  PACs/PVCs and an episode of SVT.     ASSESSMENT AND PLAN: This is a very pleasant 69 years old never smoker African-American female recently with a stage IV non-small cell lung cancer, adenocarcinoma with positive EGFR mutation with deletion in exon 19. The patient was started on treatment with Tagrisso 80 mg p.o. daily status post 23 months of treatment. The patient has been tolerating her treatment well with no concerning adverse effects. She denied having any diarrhea or skin rash. She had repeat CT  scan of the chest, abdomen pelvis as well as MRI of the brain performed recently. I personally and independently reviewed the scans and discussed the results with the patient today. Her scan showed no concerning findings for disease progression. I recommended for the patient to continue her current treatment with Tagrisso with the same dose. I will see her back for follow-up visit in 2 months for evaluation with repeat blood work. She was advised to call immediately if she has any concerning symptoms in the interval. The patient voices understanding of current disease status and treatment options and is in agreement with the current care plan. All questions were answered. The patient knows to call the clinic with any problems, questions or concerns. We can certainly see the patient much sooner if necessary.  Disclaimer: This note was dictated with voice recognition software. Similar sounding words can inadvertently be transcribed and may not be corrected upon review.

## 2020-01-13 NOTE — Telephone Encounter (Signed)
Scheduled per 7/27 los. No avs or calendar needed to be printed. mychart active.

## 2020-01-14 ENCOUNTER — Ambulatory Visit: Payer: Medicare HMO | Attending: Internal Medicine

## 2020-01-14 DIAGNOSIS — Z20822 Contact with and (suspected) exposure to covid-19: Secondary | ICD-10-CM | POA: Diagnosis not present

## 2020-01-15 LAB — SARS-COV-2, NAA 2 DAY TAT

## 2020-01-15 LAB — NOVEL CORONAVIRUS, NAA: SARS-CoV-2, NAA: NOT DETECTED

## 2020-01-22 DIAGNOSIS — I10 Essential (primary) hypertension: Secondary | ICD-10-CM | POA: Diagnosis not present

## 2020-01-22 DIAGNOSIS — C3491 Malignant neoplasm of unspecified part of right bronchus or lung: Secondary | ICD-10-CM | POA: Diagnosis not present

## 2020-01-22 DIAGNOSIS — G4700 Insomnia, unspecified: Secondary | ICD-10-CM | POA: Diagnosis not present

## 2020-01-22 DIAGNOSIS — E785 Hyperlipidemia, unspecified: Secondary | ICD-10-CM | POA: Diagnosis not present

## 2020-01-26 ENCOUNTER — Telehealth: Payer: Self-pay | Admitting: Pharmacist

## 2020-01-26 NOTE — Telephone Encounter (Signed)
Called Dr. Roland Earl office requesting labs from 8/5 be sent to our office.

## 2020-01-29 ENCOUNTER — Ambulatory Visit: Payer: Medicare HMO

## 2020-01-29 DIAGNOSIS — H04123 Dry eye syndrome of bilateral lacrimal glands: Secondary | ICD-10-CM | POA: Diagnosis not present

## 2020-01-29 DIAGNOSIS — H401131 Primary open-angle glaucoma, bilateral, mild stage: Secondary | ICD-10-CM | POA: Diagnosis not present

## 2020-01-29 DIAGNOSIS — H2513 Age-related nuclear cataract, bilateral: Secondary | ICD-10-CM | POA: Diagnosis not present

## 2020-01-29 DIAGNOSIS — H35371 Puckering of macula, right eye: Secondary | ICD-10-CM | POA: Diagnosis not present

## 2020-01-29 NOTE — Telephone Encounter (Signed)
Labs from PCP office reviewed. AST 44, ALT 56 on 01/23/20 Minor increase is LFT. Not clinically significant. Continue rosuvastatin 5mg  on Tues and Sat. Recheck on 9/21 at appointment with Dr. Gardiner Rhyme

## 2020-01-29 NOTE — Addendum Note (Signed)
Addended by: [name] Overlie D on: 01/29/2020 02:19 PM   Modules accepted: Orders

## 2020-01-30 DIAGNOSIS — R69 Illness, unspecified: Secondary | ICD-10-CM | POA: Diagnosis not present

## 2020-01-30 DIAGNOSIS — R269 Unspecified abnormalities of gait and mobility: Secondary | ICD-10-CM | POA: Diagnosis not present

## 2020-01-30 DIAGNOSIS — Z0100 Encounter for examination of eyes and vision without abnormal findings: Secondary | ICD-10-CM | POA: Diagnosis not present

## 2020-01-30 DIAGNOSIS — I10 Essential (primary) hypertension: Secondary | ICD-10-CM | POA: Diagnosis not present

## 2020-02-09 DIAGNOSIS — Z01419 Encounter for gynecological examination (general) (routine) without abnormal findings: Secondary | ICD-10-CM | POA: Diagnosis not present

## 2020-02-09 DIAGNOSIS — Z1231 Encounter for screening mammogram for malignant neoplasm of breast: Secondary | ICD-10-CM | POA: Diagnosis not present

## 2020-02-10 ENCOUNTER — Other Ambulatory Visit: Payer: Self-pay | Admitting: Cardiothoracic Surgery

## 2020-02-10 DIAGNOSIS — C3491 Malignant neoplasm of unspecified part of right bronchus or lung: Secondary | ICD-10-CM

## 2020-02-11 ENCOUNTER — Other Ambulatory Visit: Payer: Self-pay

## 2020-02-11 ENCOUNTER — Encounter: Payer: Self-pay | Admitting: Cardiothoracic Surgery

## 2020-02-11 ENCOUNTER — Ambulatory Visit: Payer: Medicare HMO | Admitting: Cardiothoracic Surgery

## 2020-02-11 ENCOUNTER — Ambulatory Visit
Admission: RE | Admit: 2020-02-11 | Discharge: 2020-02-11 | Disposition: A | Discharge status: Home / Self Care | Payer: Medicare HMO | Source: Ambulatory Visit | Attending: Cardiothoracic Surgery | Admitting: Cardiothoracic Surgery

## 2020-02-11 VITALS — BP 135/70 | HR 60 | Resp 20 | Ht 64.0 in | Wt 133.0 lb

## 2020-02-11 DIAGNOSIS — R918 Other nonspecific abnormal finding of lung field: Secondary | ICD-10-CM | POA: Diagnosis not present

## 2020-02-11 DIAGNOSIS — J910 Malignant pleural effusion: Secondary | ICD-10-CM | POA: Diagnosis not present

## 2020-02-11 DIAGNOSIS — C3491 Malignant neoplasm of unspecified part of right bronchus or lung: Secondary | ICD-10-CM

## 2020-02-11 DIAGNOSIS — J90 Pleural effusion, not elsewhere classified: Secondary | ICD-10-CM | POA: Diagnosis not present

## 2020-02-11 DIAGNOSIS — C3490 Malignant neoplasm of unspecified part of unspecified bronchus or lung: Secondary | ICD-10-CM | POA: Diagnosis not present

## 2020-02-11 DIAGNOSIS — R911 Solitary pulmonary nodule: Secondary | ICD-10-CM | POA: Diagnosis not present

## 2020-02-11 DIAGNOSIS — Z9889 Other specified postprocedural states: Secondary | ICD-10-CM | POA: Diagnosis not present

## 2020-02-11 NOTE — Progress Notes (Signed)
PCP is [name], MD Referring Provider is [name], MD  Chief Complaint  Patient presents with  . Pleural Effusion    3 month f/u with CXR   . Lung Cancer    HPI: Scheduled follow-up with chest x-ray.  History of malignant right pleural effusion status post Pleurx catheter which was removed earlier this year.  CHemotherapy directed by Dr. [name]. She denies shortness of breath or chest pain.  She does describe a crick in her right neck of 2 to 3-day duration without associated chest pain or jaw pain  Chest x-ray images personally reviewed showing no evidence of recurrent right pleural effusion.  Right upper lobe nodular density remains stable in size.   Past Medical History:  Diagnosis Date  . Anxiety   . Arthritis   . Asthma    exercise induced  . Depression    PMH  . GERD (gastroesophageal reflux disease)   . Glaucoma   . Hypertension   . lung ca dx'd 11/2017   right  . Malignant pleural effusion    right  . PONV (postoperative nausea and vomiting)   . Pre-diabetes   . Raynaud's disease   . Raynaud's disease     Past Surgical History:  Procedure Laterality Date  . ABDOMINAL HYSTERECTOMY     partial  . CHEST TUBE INSERTION Right 01/01/2018   Procedure: INSERTION PLEURAL DRAINAGE CATHETER;  Surgeon: Ivin Poot, MD;  Location: [facility];  Service: Thoracic;  Laterality: Right;  . COLONOSCOPY    . CORONARY STENT INTERVENTION N/A 06/16/2019   Procedure: CORONARY STENT INTERVENTION;  Surgeon: Jettie Booze, MD;  Location: [facility];  Service: Cardiovascular;  Laterality: N/A;  . DILATION AND CURETTAGE OF UTERUS    . EYE SURGERY     due to Glaucoma  . LEFT HEART CATH AND CORONARY ANGIOGRAPHY N/A 06/16/2019   Procedure: LEFT HEART CATH AND CORONARY ANGIOGRAPHY;  Surgeon: Jettie Booze, MD;  Location: [facility];  Service: Cardiovascular;  Laterality: N/A;  . REMOVAL OF PLEURAL DRAINAGE CATHETER Right 11/07/2018    Procedure: REMOVAL OF PLEURAL DRAINAGE CATHETER;  Surgeon: Ivin Poot, MD;  Location: [facility];  Service: Thoracic;  Laterality: Right;  . ROTATOR CUFF REPAIR    . TUBAL LIGATION    . WISDOM TOOTH EXTRACTION      Family History  Problem Relation Age of Onset  . Heart disease Sister   . Heart disease Brother   . Lung cancer Other     Social History Social History   Tobacco Use  . Smoking status: Never Smoker  . Smokeless tobacco: Never Used  Vaping Use  . Vaping Use: Never used  Substance Use Topics  . Alcohol use: Yes    Comment: up to 3 drinks per week  . Drug use: No    Comment: CBD oil     Current Outpatient Medications  Medication Sig Dispense Refill  . acetaminophen (TYLENOL) 500 MG tablet Take 1,000 mg by mouth every 6 (six) hours as needed (pain).     . Acetylcysteine (N-ACETYL-L-CYSTEINE PO) Take 1 tablet by mouth 2 (two) times daily.    Marland Kitchen ALPRAZolam (XANAX) 0.25 MG tablet Take 0.25 mg by mouth 2 (two) times daily as needed for anxiety.     Marland Kitchen amLODipine (NORVASC) 5 MG tablet Take 5 mg by mouth daily.    Marland Kitchen aspirin 81 MG chewable tablet Chew 1 tablet (81 mg total) by mouth daily.    Marland Kitchen  Biotin 1000 MCG CHEW Chew 10 mg by mouth daily.    . carvedilol (COREG) 6.25 MG tablet Take 1 tablet (6.25 mg total) by mouth 2 (two) times daily with a meal. 180 tablet 3  . clopidogrel (PLAVIX) 75 MG tablet Take 1 tablet (75 mg total) by mouth daily with breakfast. 90 tablet 3  . dorzolamide-timolol (COSOPT) 22.3-6.8 MG/ML ophthalmic solution Instill 1 drop in both eyes twice daily    . ezetimibe (ZETIA) 10 MG tablet Take 10 mg by mouth daily.     . hydrochlorothiazide (HYDRODIURIL) 25 MG tablet Take 25 mg by mouth daily. Currently taking 2 of the 12.5mg  daily    . latanoprost (XALATAN) 0.005 % ophthalmic solution Place 1 drop into both eyes at bedtime.     . nitroGLYCERIN (NITROSTAT) 0.4 MG SL tablet Place 1 tablet (0.4 mg total) under the tongue every 5 (five) minutes x 3 doses as  needed for chest pain. 30 tablet 12  . osimertinib mesylate (TAGRISSO) 80 MG tablet Take 1 tablet (80 mg total) by mouth daily. 30 tablet 3  . rosuvastatin (CRESTOR) 5 MG tablet Take one tablet Saturdays and Tuesdays only 90 tablet 3  . traMADol (ULTRAM) 50 MG tablet Take 50 mg by mouth 2 (two) times daily as needed.     Marland Kitchen VITAMIN D, CHOLECALCIFEROL, PO Take 5,000 Units by mouth daily.      No current facility-administered medications for this visit.    Allergies  Allergen Reactions  . Penicillins Other (See Comments)    SYNCOPE PATIENT HAS HAD A PCN REACTION WITH IMMEDIATE RASH, FACIAL/TONGUE/THROAT SWELLING, SOB, OR LIGHTHEADEDNESS WITH HYPOTENSION:  #  #  YES  #  # Has patient had a PCN reaction causing severe rash involving mucus membranes or skin necrosis: No Has patient had a PCN reaction that required hospitalization: No Has patient had a PCN reaction occurring within the last 10 years: No   . Vicodin [Hydrocodone-Acetaminophen] Other (See Comments)    Sped up heart and breathing    Review of Systems  Weight stable No productive cough No loss of taste or smell No edema No palpitations No fever BP 135/70   Pulse 60   Resp 20   Ht 5\' 4"  (1.626 m)   Wt 133 lb (60.3 kg)   LMP  (LMP Unknown)   SpO2 92% Comment: RA  BMI 22.83 kg/m  Physical Exam      Exam    General- alert and comfortable    Neck- no JVD, no cervical adenopathy palpable, no carotid bruit   Lungs- clear without rales, wheezes   Cor- regular rate and rhythm, no murmur , gallop   Abdomen- soft, non-tender   Extremities - warm, non-tender, minimal edema   Neuro- oriented, appropriate, no focal weakness   Diagnostic Tests: Today's chest x-ray personally reviewed showing no recurrent pleural effusion  Impression: History of malignant right pleural effusion, Pleurx catheter has been removed earlier this year  Plan: Continue 11-month follow-up with chest x-ray to assess for recurrent pleural  effusion   Len Childs, MD [facility] [phone]

## 2020-03-04 ENCOUNTER — Encounter: Payer: Self-pay | Admitting: Internal Medicine

## 2020-03-05 NOTE — Progress Notes (Signed)
Cardiology Office Note:    Date:  03/09/2020   ID:  Allison [name], DOB May 16, 1951, MRN [id number]  PCP:  Allison Blade, MD  Cardiologist:  Allison Heinz, MD  Electrophysiologist:  None   Referring MD: Allison Blade, MD   Chief Complaint  Patient presents with  . Chest Pain    History of Present Illness:    Allison [name] is a 69 y.o. female with a hx of CAD status post DES to RCA 06/16/19, stage IV lung cancer, hypertension, hyperlipidemia who presents for follow-up.  She presented to ED on 06/12/2019 with chest pain, found to have NSTEMI with peak high-sensitivity troponin 9133.  TTE on 06/13/2019 showed LVEF 55 to 60%, mild LVH, low normal RV systolic function, moderate MR. Cath showed 95% proximal RCA lesion, drug-eluting stent was successfully placed.  Zio patch x13 days on 01/07/2020 showed 3 episodes of NSVT longest lasting 7 beats, 17 episodes of SVT, longest lasting 15 seconds.  Since last clinic visit, she reports that she has been doing well.  She denies any chest pain or dyspnea.  Has been taking aspirin and Plavix, denies any bleeding issues.  States that she has been having palpitations, can occur up to 3 times per week and lasts for several minutes.  States that feels like heart is racing during episodes.  She has been checking her BP, has been 120s to 150s over 60s to 70s.  Her atorvastatin was discontinued due to transaminitis, she was also tried on pravastatin but again developed transaminitis and has been discontinued.  She was seen in lipid clinic and started on rosuvastatin 5 mg every other day but developed myalgias.  Dose was reduced to twice weekly.  Had significant myalgias so stopped taking.    Since last clinic visit, she reports that she started to have chest pain.  States that she did a 5K on Saturday.  While walking she had no symptoms but when she would try to jog would get tightness in her chest.  Reports BP at home has been 110s to 140s.   Reports she has been having some abdominal pain after she takes her aspirin, which she takes at night.  Denies any blood in the stool.   Past Medical History:  Diagnosis Date  . Anxiety   . Arthritis   . Asthma    exercise induced  . Depression    PMH  . GERD (gastroesophageal reflux disease)   . Glaucoma   . Hypertension   . lung ca dx'd 11/2017   right  . Malignant pleural effusion    right  . PONV (postoperative nausea and vomiting)   . Pre-diabetes   . Raynaud's disease   . Raynaud's disease     Past Surgical History:  Procedure Laterality Date  . ABDOMINAL HYSTERECTOMY     partial  . CHEST TUBE INSERTION Right 01/01/2018   Procedure: INSERTION PLEURAL DRAINAGE CATHETER;  Surgeon: Allison Poot, MD;  Location: [facility];  Service: Thoracic;  Laterality: Right;  . COLONOSCOPY    . CORONARY STENT INTERVENTION N/A 06/16/2019   Procedure: CORONARY STENT INTERVENTION;  Surgeon: Allison Booze, MD;  Location: [facility];  Service: Cardiovascular;  Laterality: N/A;  . DILATION AND CURETTAGE OF UTERUS    . EYE SURGERY     due to Glaucoma  . LEFT HEART CATH AND CORONARY ANGIOGRAPHY N/A 06/16/2019   Procedure: LEFT HEART CATH AND CORONARY ANGIOGRAPHY;  Surgeon: Allison Booze, MD;  Location: [facility];  Service: Cardiovascular;  Laterality: N/A;  . REMOVAL OF PLEURAL DRAINAGE CATHETER Right 11/07/2018   Procedure: REMOVAL OF PLEURAL DRAINAGE CATHETER;  Surgeon: Allison Poot, MD;  Location: [facility];  Service: Thoracic;  Laterality: Right;  . ROTATOR CUFF REPAIR    . TUBAL LIGATION    . WISDOM TOOTH EXTRACTION      Current Medications: Current Meds  Medication Sig  . acetaminophen (TYLENOL) 500 MG tablet Take 1,000 mg by mouth every 6 (six) hours as needed (pain).   . Acetylcysteine (N-ACETYL-L-CYSTEINE PO) Take 1 tablet by mouth 2 (two) times daily.  Marland Kitchen ALPRAZolam (XANAX) 0.25 MG tablet Take 0.25 mg by mouth 2 (two) times daily as needed for  anxiety.   Marland Kitchen amLODipine (NORVASC) 5 MG tablet Take 5 mg by mouth daily.  Marland Kitchen aspirin 81 MG chewable tablet Chew 1 tablet (81 mg total) by mouth daily.  . Biotin 1000 MCG CHEW Chew 10 mg by mouth daily.  . carvedilol (COREG) 6.25 MG tablet Take 1 tablet (6.25 mg total) by mouth 2 (two) times daily with a meal.  . CHELATED MAGNESIUM PO Take by mouth.  . Chlorphen-PE-Acetaminophen 4-10-325 MG TABS Norel AD 4 mg-10 mg-325 mg tablet  Take 1 tablet every 6 hours by oral route as needed.  . clopidogrel (PLAVIX) 75 MG tablet Take 1 tablet (75 mg total) by mouth daily with breakfast.  . dorzolamide-timolol (COSOPT) 22.3-6.8 MG/ML ophthalmic solution Instill 1 drop in both eyes twice daily  . ezetimibe (ZETIA) 10 MG tablet Take 10 mg by mouth daily.   . hydrochlorothiazide (HYDRODIURIL) 25 MG tablet Take 25 mg by mouth daily. Currently taking 2 of the 12.5mg  daily  . latanoprost (XALATAN) 0.005 % ophthalmic solution Place 1 drop into both eyes at bedtime.   . nitroGLYCERIN (NITROSTAT) 0.4 MG SL tablet Place 1 tablet (0.4 mg total) under the tongue every 5 (five) minutes x 3 doses as needed for chest pain.  Marland Kitchen osimertinib mesylate (TAGRISSO) 80 MG tablet Take 1 tablet (80 mg total) by mouth daily.  . traMADol (ULTRAM) 50 MG tablet Take 50 mg by mouth 2 (two) times daily as needed.   Marland Kitchen VITAMIN D, CHOLECALCIFEROL, PO Take 5,000 Units by mouth daily.      Allergies:   Penicillins and Vicodin [hydrocodone-acetaminophen]   Social History   Socioeconomic History  . Marital status: Widowed    Spouse name: Not on file  . Number of children: 3  . Years of education: Not on file  . Highest education level: Not on file  Occupational History    Employer: [employer]  Tobacco Use  . Smoking status: Never Smoker  . Smokeless tobacco: Never Used  Vaping Use  . Vaping Use: Never used  Substance and Sexual Activity  . Alcohol use: Yes    Comment: up to 3 drinks per week  . Drug use: No    Comment: CBD oil   .  Sexual activity: Not Currently    Comment: Hysterectomy  Other Topics Concern  . Not on file  Social History Narrative  . Not on file   Social Determinants of Health   Financial Resource Strain: Low Risk   . Difficulty of Paying Living Expenses: Not hard at all  Food Insecurity: No Food Insecurity  . Worried About Charity fundraiser in the Last Year: Never true  . Ran Out of Food in the Last Year: Never true  Transportation Needs: No  Transportation Needs  . Lack of Transportation (Medical): No  . Lack of Transportation (Non-Medical): No  Physical Activity: Sufficiently Active  . Days of Exercise per Week: 5 days  . Minutes of Exercise per Session: 30 min  Stress:   . Feeling of Stress : Not on file  Social Connections: Unknown  . Frequency of Communication with Friends and Family: Three times a week  . Frequency of Social Gatherings with Friends and Family: Once a week  . Attends Religious Services: Not on file  . Active Member of Clubs or Organizations: Yes  . Attends Archivist Meetings: More than 4 times per year  . Marital Status: Living with partner     Family History: The patient's family history includes Heart disease in her brother and sister; Lung cancer in an other family member.  ROS:   Please see the history of present illness.     All other systems reviewed and are negative.  EKGs/Labs/Other Studies Reviewed:    The following studies were reviewed today:   EKG:  EKG is ordered today.  EKG shows sinus bradycardia, rate 55, no ST abnormalities  Cath 06/16/19:  Prox LAD lesion is 25% stenosed.  Prox RCA lesion is 95% stenosed.  A drug-eluting stent was successfully placed using a SYNERGY XD 2.25X28, postdilated to 2.5 mm.  Post intervention, there is a 0% residual stenosis.  The left ventricular systolic function is normal.  LV end diastolic pressure is normal.  The left ventricular ejection fraction is 55-65% by visual  estimate.  There is no aortic valve stenosis.   Continue dual antiplatelet therapy.  Would check with Pharmacy about interactions of Plavix with Tagrisso.  If there is an interaction, would switch to Brilinta.       TTE 06/13/19: 1. Left ventricular ejection fraction, by visual estimation, is 55 to  60%. The left ventricle has normal function. There is mildly increased  left ventricular hypertrophy.  2. The left ventricle has no regional wall motion abnormalities.  3. Global right ventricle has low normal systolic function.The right  ventricular size is normal. No increase in right ventricular wall  thickness.  4. Left atrial size was normal.  5. Right atrial size was normal.  6. Mild mitral annular calcification.  7. The mitral valve is degenerative. Moderate mitral valve regurgitation.  8. The tricuspid valve is grossly normal.  9. The aortic valve is tricuspid. Aortic valve regurgitation is not  visualized. No evidence of aortic valve sclerosis or stenosis.  10. The pulmonic valve was grossly normal. Pulmonic valve regurgitation is  not visualized.  11. Normal pulmonary artery systolic pressure.  12. The inferior vena cava is normal in size with greater than 50%  respiratory variability, suggesting right atrial pressure of 3 mmHg.   Carotid duplex 08/01/19: Right Carotid: There is no evidence of stenosis in the right ICA. The         extracranial vessels were near-normal with only minimal  wall         thickening or plaque.   Left Carotid: Velocities in the left ICA are consistent with a 1-39%  stenosis.        Non-hemodynamically significant plaque <50% noted in the  CCA.   Vertebrals: Bilateral vertebral arteries demonstrate antegrade flow.  Subclavians: Normal flow hemodynamics were seen in bilateral subclavian        arteries.   Recent Labs: 06/13/2019: B Natriuretic Peptide 177.4; TSH 1.774 12/09/2019: Magnesium  2.1 01/12/2020: ALT 42; BUN  14; Creatinine 1.03; Hemoglobin 12.0; Platelet Count 199; Potassium 3.9; Sodium 142  Recent Lipid Panel    Component Value Date/Time   CHOL 183 12/29/2019 0817   TRIG 77 12/29/2019 0817   HDL 60 12/29/2019 0817   CHOLHDL 3.1 12/29/2019 0817   CHOLHDL 4.1 06/13/2019 0100   VLDL 13 06/13/2019 0100   LDLCALC 109 (H) 12/29/2019 0817    Physical Exam:    VS:  BP 138/70   Pulse (!) 55   Ht 5\' 4"  (1.626 m)   Wt 132 lb 9.6 oz (60.1 kg)   LMP  (LMP Unknown)   BMI 22.76 kg/m     Wt Readings from Last 3 Encounters:  03/09/20 132 lb 9.6 oz (60.1 kg)  02/11/20 133 lb (60.3 kg)  01/13/20 130 lb 14.4 oz (59.4 kg)     GEN:  Well nourished, well developed in no acute distress HEENT: Normal NECK: No JVD CARDIAC: RRR, no murmurs, rubs, gallops RESPIRATORY:  Clear to auscultation without rales, wheezing or rhonchi  ABDOMEN: Soft, non-tender, non-distended MUSCULOSKELETAL:  No edema SKIN: Warm and dry NEUROLOGIC:  Alert and oriented x 3 PSYCHIATRIC:  Normal affect   ASSESSMENT:    1. Coronary artery disease involving native coronary artery of native heart with angina pectoris ([place])   2. Hyperlipidemia, unspecified hyperlipidemia type   3. Hypertension, unspecified type   4. Palpitations    PLAN:    CAD: Status post drug-eluting stent to RCA on 06/16/2019.  Currently reporting chest pain with significant exertion -Continue aspirin 81 mg daily, Plavix 75 mg daily.  Will continue aspirin indefinitely.  Continue Plavix through 05/2020.  Reports she has been having abdominal pain after taking her aspirin, which she takes at night.  Recommended taking aspirin with food during the day.  If continues to have abdominal pain, then would recommend starting famotidine. -Continue Coreg 6.25 mg twice daily -Has had issues starting statins due to transaminitis and myalgias.  Will refer to lipid clinic for starting PCSK9 inhibitor -Given exertional chest pain reported,  will evaluate for ischemia with exercise Myoview  Palpitations: Description concerning for arrhythmia.  Zio patch x13 days on 01/07/2020 showed 3 episodes of NSVT longest lasting 7 beats, 17 episodes of SVT, longest lasting 15 seconds.  Carotid bruit: 1 to 39% left carotid artery stenosis, no stenosis in right coronary artery on carotid duplex 08/01/2019.  Hyperlipidemia: LDL 148 on 06/13/2019, improved to LDL 44 08/15/19 while on Lipitor 80 mg daily, but was discontinued due to transaminitis.  She also reported having myalgias with atorvastatin.  Referred to lipid clinic, started on rosuvastatin 5 mg every other day but developed myalgias.  Was reduced to twice weekly but continued to have myalgias so was discontinued.  Will refer to lipid clinic for PCSK9 inhibitor evaluation  Hypertension: On carvedilol 6.25 mg twice daily, amlodipine 5 mg daily, and hydrochlorothiazide 25 mg daily.  Mildly elevated in clinic today though she has not taken her meds today.  Asked to check BP daily for next 2 weeks and call with results.  Stage IV lung cancer: Followed by oncology.  On Tagrisso  RTC in 3 months  Medication Adjustments/Labs and Tests Ordered: Current medicines are reviewed at length with the patient today.  Concerns regarding medicines are outlined above.  Orders Placed This Encounter  Procedures  . MYOCARDIAL PERFUSION IMAGING  . EKG 12-Lead   No orders of the defined types were placed in this encounter.   Patient Instructions  Medication Instructions:  Your physician recommends that you continue on your current medications as directed. Please refer to the Current Medication list given to you today.  *If you need a refill on your cardiac medications before your next appointment, please call your pharmacy*  Testing/Procedures: Your physician has requested that you have an exercise stress myoview. For further information please visit [email or web address]. Please follow instruction sheet, as  given. --a covid test is required, we will schedule this for you.  Follow-Up: At [facility], you and your health needs are our priority.  As part of our continuing mission to provide you with exceptional heart care, we have created designated Provider Care Teams.  These Care Teams include your primary Cardiologist (physician) and Advanced Practice Providers (APPs -  Physician Assistants and Nurse Practitioners) who all work together to provide you with the care you need, when you need it.  We recommend signing up for the patient portal called "MyChart".  Sign up information is provided on this After Visit Summary.  MyChart is used to connect with patients for Virtual Visits (Telemedicine).  Patients are able to view lab/test results, encounter notes, upcoming appointments, etc.  Non-urgent messages can be sent to your provider as well.   To learn more about what you can do with MyChart, go to [email or web address].    Your next appointment:   3 month(s)  The format for your next appointment:   In Person  Provider:   Oswaldo Milian, MD  Follow up appointment with pharmacist (Lipid clinic)   Other Instructions Please check your blood pressure at home daily, write it down.  Call the office or send message via Mychart with the readings in 2 weeks for Dr. Gardiner Rhyme to review.   Please call in 2 weeks if you are still experiencing abdominal pain     Signed, Allison Heinz, MD  03/09/2020 9:13 AM    [place]

## 2020-03-08 ENCOUNTER — Encounter: Payer: Self-pay | Admitting: Internal Medicine

## 2020-03-09 ENCOUNTER — Encounter: Payer: Self-pay | Admitting: Cardiology

## 2020-03-09 ENCOUNTER — Other Ambulatory Visit: Payer: Self-pay

## 2020-03-09 ENCOUNTER — Ambulatory Visit: Payer: Medicare HMO | Admitting: Cardiology

## 2020-03-09 VITALS — BP 138/70 | HR 55 | Ht 64.0 in | Wt 132.6 lb

## 2020-03-09 DIAGNOSIS — R002 Palpitations: Secondary | ICD-10-CM | POA: Diagnosis not present

## 2020-03-09 DIAGNOSIS — I10 Essential (primary) hypertension: Secondary | ICD-10-CM

## 2020-03-09 DIAGNOSIS — E785 Hyperlipidemia, unspecified: Secondary | ICD-10-CM | POA: Diagnosis not present

## 2020-03-09 DIAGNOSIS — I25119 Atherosclerotic heart disease of native coronary artery with unspecified angina pectoris: Secondary | ICD-10-CM | POA: Diagnosis not present

## 2020-03-09 NOTE — Patient Instructions (Signed)
Medication Instructions:  Your physician recommends that you continue on your current medications as directed. Please refer to the Current Medication list given to you today.  *If you need a refill on your cardiac medications before your next appointment, please call your pharmacy*  Testing/Procedures: Your physician has requested that you have an exercise stress myoview. For further information please visit [email or web address]. Please follow instruction sheet, as given. --a covid test is required, we will schedule this for you.  Follow-Up: At [facility], you and your health needs are our priority.  As part of our continuing mission to provide you with exceptional heart care, we have created designated Provider Care Teams.  These Care Teams include your primary Cardiologist (physician) and Advanced Practice Providers (APPs -  Physician Assistants and Nurse Practitioners) who all work together to provide you with the care you need, when you need it.  We recommend signing up for the patient portal called "MyChart".  Sign up information is provided on this After Visit Summary.  MyChart is used to connect with patients for Virtual Visits (Telemedicine).  Patients are able to view lab/test results, encounter notes, upcoming appointments, etc.  Non-urgent messages can be sent to your provider as well.   To learn more about what you can do with MyChart, go to [email or web address].    Your next appointment:   3 month(s)  The format for your next appointment:   In Person  Provider:   Oswaldo Milian, MD  Follow up appointment with pharmacist (Lipid clinic)   Other Instructions Please check your blood pressure at home daily, write it down.  Call the office or send message via Mychart with the readings in 2 weeks for Dr. Gardiner Rhyme to review.   Please call in 2 weeks if you are still experiencing abdominal pain

## 2020-03-10 NOTE — Progress Notes (Signed)
Patient ID: Allison [name]                 DOB: 1950-08-14                    MRN: [id number]     HPI: Allison [name] is a 69 y.o. female patient referred to lipid clinic by Dr. Gardiner Rhyme. PMH is significant for CAD status post DES to RCA 06/16/19, stage IV lung cancer, hypertension and hyperlipidemia.  Her atorvastatin was discontinued due to transaminitis. She was also tried on pravastatin but again developed transaminitis and has been discontinued.  Patient presents to lipid clinic today in good spirits. Patient last seen in lipid clinic on 01/08/2020. At that time, patient started rosuvastatin 5 mg every other day but complained of leg cramps within a few days. Dose was reduced to 5 mg twice weekly. Patient continued to experience myalgias. She reports she would experience cramps first thing in the morning. Her calves would be so sore that she could not walk or get around very easily. These cramps continued to worsen until patient stopped taking it. Now she feels much better. Cramps took about a week to resolve.  Patient reports she receives Tagrisso for free using a manufacturer assistance program. Patient drinks plenty of water and continues to stay active with dancing and other exercises, as well as tending to her vegetable garden.   Current Medications: Zetia 10mg  daily Intolerances: Atorvastatin (cramps, LFT elevations), pravastatin (cramps, LFT elevations), rosuvastatin 5 mg twice weekly (myalgias) Risk Factors: NSTEMI LDL goal: <70  Diet: Not addressed this visit  Exercise: Line dancing (Mon, Wed), [place] chi, walking, resistance training with machines at [employer], bike riding, sliver sneakers app- works out usually 4 days a week  Family History: The patient's family history includes Heart disease in her brother and sister; Lung cancer in an other family member.  Social History: Occassional ETOH, less than weekly  Labs: 12/29/19: TC 183, TG 77, HDL 60, LDL 109 (Zetia 10mg ) 08/15/19:  ALT 91 AST 52  Past Medical History:  Diagnosis Date  . Anxiety   . Arthritis   . Asthma    exercise induced  . Depression    PMH  . GERD (gastroesophageal reflux disease)   . Glaucoma   . Hypertension   . lung ca dx'd 11/2017   right  . Malignant pleural effusion    right  . PONV (postoperative nausea and vomiting)   . Pre-diabetes   . Raynaud's disease   . Raynaud's disease     Current Outpatient Medications on File Prior to Visit  Medication Sig Dispense Refill  . acetaminophen (TYLENOL) 500 MG tablet Take 1,000 mg by mouth every 6 (six) hours as needed (pain).     . Acetylcysteine (N-ACETYL-L-CYSTEINE PO) Take 1 tablet by mouth 2 (two) times daily.    Marland Kitchen ALPRAZolam (XANAX) 0.25 MG tablet Take 0.25 mg by mouth 2 (two) times daily as needed for anxiety.     Marland Kitchen amLODipine (NORVASC) 5 MG tablet Take 5 mg by mouth daily.    Marland Kitchen aspirin 81 MG chewable tablet Chew 1 tablet (81 mg total) by mouth daily.    . Biotin 1000 MCG CHEW Chew 10 mg by mouth daily.    . carvedilol (COREG) 6.25 MG tablet Take 1 tablet (6.25 mg total) by mouth 2 (two) times daily with a meal. 180 tablet 3  . CHELATED MAGNESIUM PO Take by mouth.    . Chlorphen-PE-Acetaminophen 4-10-325  MG TABS Norel AD 4 mg-10 mg-325 mg tablet  Take 1 tablet every 6 hours by oral route as needed.    . clopidogrel (PLAVIX) 75 MG tablet Take 1 tablet (75 mg total) by mouth daily with breakfast. 90 tablet 3  . dorzolamide-timolol (COSOPT) 22.3-6.8 MG/ML ophthalmic solution Instill 1 drop in both eyes twice daily    . ezetimibe (ZETIA) 10 MG tablet Take 10 mg by mouth daily.     . hydrochlorothiazide (HYDRODIURIL) 25 MG tablet Take 25 mg by mouth daily. Currently taking 2 of the 12.5mg  daily    . latanoprost (XALATAN) 0.005 % ophthalmic solution Place 1 drop into both eyes at bedtime.     . nitroGLYCERIN (NITROSTAT) 0.4 MG SL tablet Place 1 tablet (0.4 mg total) under the tongue every 5 (five) minutes x 3 doses as needed for chest  pain. 30 tablet 12  . osimertinib mesylate (TAGRISSO) 80 MG tablet Take 1 tablet (80 mg total) by mouth daily. 30 tablet 3  . traMADol (ULTRAM) 50 MG tablet Take 50 mg by mouth 2 (two) times daily as needed.     Marland Kitchen VITAMIN D, CHOLECALCIFEROL, PO Take 5,000 Units by mouth daily.      No current facility-administered medications on file prior to visit.    Allergies  Allergen Reactions  . Penicillins Other (See Comments)    SYNCOPE PATIENT HAS HAD A PCN REACTION WITH IMMEDIATE RASH, FACIAL/TONGUE/THROAT SWELLING, SOB, OR LIGHTHEADEDNESS WITH HYPOTENSION:  #  #  YES  #  # Has patient had a PCN reaction causing severe rash involving mucus membranes or skin necrosis: No Has patient had a PCN reaction that required hospitalization: No Has patient had a PCN reaction occurring within the last 10 years: No   . Vicodin [Hydrocodone-Acetaminophen] Other (See Comments)    Sped up heart and breathing    Assessment/Plan:  1. Hyperlipidemia - Patient's LDL is above goal of <70 mg/dL on Zetia 10 mg. Patient has now tried and failed 3 statins due to myalgias, including at low doses. She is good candidate to start PCSK9 inhibitor. Plan to initiate Praluent 75 mg SQ injection once every 2 weeks, once prior authorization is approved. Counseled patient on the clinical benefits, side effects, and proper injection technique of medication. Patient is agreeable to plan. Our main concern is affordability of the medication. Patient may not be in the donut hole yet since she receives Tagrisso via patient assistance, but long term will certainly cause her to hit the coverage gap each year. She has a extra help plan called care plus through [employer]. I will call and see if they will cover the cost of PCSK9i for the patient. After approval, we will follow up with patient on cost.   Thank you,  Esmeralda Links (PharmD Candidate 2022)  Ramond Dial, Pharm.D, BCPS, CPP [place]  [address],  [place], [place] [address]  Phone: [phone]; Fax: [phone]

## 2020-03-11 ENCOUNTER — Other Ambulatory Visit: Payer: Self-pay

## 2020-03-11 ENCOUNTER — Telehealth: Payer: Self-pay | Admitting: Pharmacist

## 2020-03-11 ENCOUNTER — Ambulatory Visit (INDEPENDENT_AMBULATORY_CARE_PROVIDER_SITE_OTHER): Payer: Medicare HMO | Admitting: Pharmacist

## 2020-03-11 DIAGNOSIS — E7800 Pure hypercholesterolemia, unspecified: Secondary | ICD-10-CM

## 2020-03-11 MED ORDER — PRALUENT 75 MG/ML ~~LOC~~ SOAJ: 1.0000 "pen " | SUBCUTANEOUS | 11 refills | Status: DC

## 2020-03-11 NOTE — Patient Instructions (Signed)
Great to see you today!  We would like to start Praluent 75 mg injection once every 2 weeks. We will work on getting this approved by your insurance and trying to ensure it will be affordable for you. We will call you when we hear back from your insurance and when the medication is ready for you to pick up.   Please call us if you have any questions or concerns: [phone]

## 2020-03-11 NOTE — Telephone Encounter (Signed)
Praluent approved through 06/18/20. Unfortunately Care Plus does not cover the Praluent. This coverage is more for experimental medications, things that are not covered by her regular insurance. Called [facility]- cost of Praluent is $47/month Spoke with patient who is ok with paying the $47/month. We discussed dosing and every other week dosing vs the 1st and 15th of the month. Will get labs done at visit with Dr. Gardiner Rhyme on 12/28.

## 2020-03-15 ENCOUNTER — Inpatient Hospital Stay: Payer: Medicare HMO | Attending: Internal Medicine | Admitting: Internal Medicine

## 2020-03-15 ENCOUNTER — Other Ambulatory Visit: Payer: Self-pay

## 2020-03-15 ENCOUNTER — Encounter: Payer: Self-pay | Admitting: Internal Medicine

## 2020-03-15 ENCOUNTER — Inpatient Hospital Stay: Payer: Medicare HMO

## 2020-03-15 ENCOUNTER — Telehealth: Payer: Self-pay | Admitting: Internal Medicine

## 2020-03-15 VITALS — BP 132/88 | HR 53 | Temp 97.0°F | Resp 17 | Ht 64.0 in | Wt 134.0 lb

## 2020-03-15 DIAGNOSIS — J910 Malignant pleural effusion: Secondary | ICD-10-CM | POA: Diagnosis not present

## 2020-03-15 DIAGNOSIS — C3491 Malignant neoplasm of unspecified part of right bronchus or lung: Secondary | ICD-10-CM

## 2020-03-15 DIAGNOSIS — C3490 Malignant neoplasm of unspecified part of unspecified bronchus or lung: Secondary | ICD-10-CM | POA: Diagnosis not present

## 2020-03-15 DIAGNOSIS — K219 Gastro-esophageal reflux disease without esophagitis: Secondary | ICD-10-CM | POA: Diagnosis not present

## 2020-03-15 DIAGNOSIS — I7300 Raynaud's syndrome without gangrene: Secondary | ICD-10-CM | POA: Diagnosis not present

## 2020-03-15 DIAGNOSIS — I10 Essential (primary) hypertension: Secondary | ICD-10-CM | POA: Diagnosis not present

## 2020-03-15 DIAGNOSIS — C771 Secondary and unspecified malignant neoplasm of intrathoracic lymph nodes: Secondary | ICD-10-CM | POA: Diagnosis not present

## 2020-03-15 DIAGNOSIS — Z79899 Other long term (current) drug therapy: Secondary | ICD-10-CM | POA: Diagnosis not present

## 2020-03-15 DIAGNOSIS — J45909 Unspecified asthma, uncomplicated: Secondary | ICD-10-CM | POA: Diagnosis not present

## 2020-03-15 DIAGNOSIS — J90 Pleural effusion, not elsewhere classified: Secondary | ICD-10-CM | POA: Insufficient documentation

## 2020-03-15 DIAGNOSIS — M1990 Unspecified osteoarthritis, unspecified site: Secondary | ICD-10-CM | POA: Diagnosis not present

## 2020-03-15 DIAGNOSIS — C3411 Malignant neoplasm of upper lobe, right bronchus or lung: Secondary | ICD-10-CM | POA: Insufficient documentation

## 2020-03-15 DIAGNOSIS — R69 Illness, unspecified: Secondary | ICD-10-CM | POA: Diagnosis not present

## 2020-03-15 DIAGNOSIS — Z5111 Encounter for antineoplastic chemotherapy: Secondary | ICD-10-CM | POA: Diagnosis not present

## 2020-03-15 DIAGNOSIS — F419 Anxiety disorder, unspecified: Secondary | ICD-10-CM | POA: Diagnosis not present

## 2020-03-15 DIAGNOSIS — C7800 Secondary malignant neoplasm of unspecified lung: Secondary | ICD-10-CM | POA: Insufficient documentation

## 2020-03-15 DIAGNOSIS — F329 Major depressive disorder, single episode, unspecified: Secondary | ICD-10-CM | POA: Diagnosis not present

## 2020-03-15 DIAGNOSIS — Z7982 Long term (current) use of aspirin: Secondary | ICD-10-CM | POA: Diagnosis not present

## 2020-03-15 LAB — CBC WITH DIFFERENTIAL (CANCER CENTER ONLY)
Abs Immature Granulocytes: 0 10*3/uL (ref 0.00–0.07)
Basophils Absolute: 0 10*3/uL (ref 0.0–0.1)
Basophils Relative: 1 %
Eosinophils Absolute: 0.2 10*3/uL (ref 0.0–0.5)
Eosinophils Relative: 4 %
HCT: 36.9 % (ref 36.0–46.0)
Hemoglobin: 12 g/dL (ref 12.0–15.0)
Immature Granulocytes: 0 %
Lymphocytes Relative: 54 %
Lymphs Abs: 1.9 10*3/uL (ref 0.7–4.0)
MCH: 27 pg (ref 26.0–34.0)
MCHC: 32.5 g/dL (ref 30.0–36.0)
MCV: 83.1 fL (ref 80.0–100.0)
Monocytes Absolute: 0.4 10*3/uL (ref 0.1–1.0)
Monocytes Relative: 11 %
Neutro Abs: 1.1 10*3/uL — ABNORMAL LOW (ref 1.7–7.7)
Neutrophils Relative %: 30 %
Platelet Count: 224 10*3/uL (ref 150–400)
RBC: 4.44 MIL/uL (ref 3.87–5.11)
RDW: 14.7 % (ref 11.5–15.5)
WBC Count: 3.5 10*3/uL — ABNORMAL LOW (ref 4.0–10.5)
nRBC: 0 % (ref 0.0–0.2)

## 2020-03-15 LAB — CMP (CANCER CENTER ONLY)
ALT: 42 U/L (ref 0–44)
AST: 32 U/L (ref 15–41)
Albumin: 4 g/dL (ref 3.5–5.0)
Alkaline Phosphatase: 59 U/L (ref 38–126)
Anion gap: 7 (ref 5–15)
BUN: 12 mg/dL (ref 8–23)
CO2: 30 mmol/L (ref 22–32)
Calcium: 9.7 mg/dL (ref 8.9–10.3)
Chloride: 103 mmol/L (ref 98–111)
Creatinine: 1.01 mg/dL — ABNORMAL HIGH (ref 0.44–1.00)
GFR, Est AFR Am: >60 mL/min (ref >60)
GFR, Estimated: 57 mL/min — ABNORMAL LOW (ref >60)
Glucose, Bld: 98 mg/dL (ref 70–99)
Potassium: 4.4 mmol/L (ref 3.5–5.1)
Sodium: 140 mmol/L (ref 135–145)
Total Bilirubin: 0.6 mg/dL (ref 0.3–1.2)
Total Protein: 7.6 g/dL (ref 6.5–8.1)

## 2020-03-15 NOTE — Telephone Encounter (Signed)
Scheduled per los. Gave avs and calendar  

## 2020-03-15 NOTE — Progress Notes (Signed)
[facility] [phone]   [phone]  OFFICE PROGRESS NOTE  [name], [place] [address]  DIAGNOSIS: Stage IV (T2 a,N2, M1a) non-small cell lung cancer, adenocarcinoma diagnosed in July 2019 and presented with right upper lobe lung mass in addition to mediastinal lymphadenopathy as well as bilateral pulmonary nodules and malignant right pleural effusion.  Biomarker Findings Microsatellite status - Cannot Be Determined Tumor Mutational Burden - Cannot Be Determined Genomic Findings For a complete list of the genes assayed, please refer to the Appendix. EGFR exon 19 deletion ([id number]) TP53 Y220C 7 Disease relevant genes with no reportable alterations: KRAS, ALK, BRAF, MET, RET, ERBB2, ROS1   PRIOR THERAPY: Status post right Pleurx catheter placement by Dr. Prescott Gum for drainage of malignant right pleural effusion.  CURRENT THERAPY: Tagrisso 80 mg p.o. daily.  First dose was given on 01/29/2018.  Status post 25 months of treatment.  INTERVAL HISTORY: Allison [name] 69 y.o. female returns to the clinic today for follow-up visit.  The patient is feeling fine today with no concerning complaints except for central chest pain that lasted for 4 minutes earlier today and resolved spontaneously.  She denied having any associated shortness of breath, cough or hemoptysis.  She denied having any diaphoresis.  She has no nausea, vomiting, diarrhea or constipation.  She denied having any headache or visual changes.  She has no weight loss or night sweats.  She continues to tolerate her treatment with Tagrisso fairly well.  The patient is here today for evaluation and repeat blood work.  MEDICAL HISTORY: Past Medical History:  Diagnosis Date  . Anxiety   . Arthritis   . Asthma    exercise induced  . Depression    PMH  . GERD (gastroesophageal reflux disease)   . Glaucoma   . Hypertension   . lung ca dx'd  11/2017   right  . Malignant pleural effusion    right  . PONV (postoperative nausea and vomiting)   . Pre-diabetes   . Raynaud's disease   . Raynaud's disease     ALLERGIES:  is allergic to penicillins and vicodin [hydrocodone-acetaminophen].  MEDICATIONS:  Current Outpatient Medications  Medication Sig Dispense Refill  . acetaminophen (TYLENOL) 500 MG tablet Take 1,000 mg by mouth every 6 (six) hours as needed (pain).     . Acetylcysteine (N-ACETYL-L-CYSTEINE PO) Take 1 tablet by mouth 2 (two) times daily.    . Alirocumab (PRALUENT) 75 MG/ML SOAJ Inject 1 pen into the skin every 14 (fourteen) days. 2 mL 11  . ALPRAZolam (XANAX) 0.25 MG tablet Take 0.25 mg by mouth 2 (two) times daily as needed for anxiety.     Marland Kitchen amLODipine (NORVASC) 5 MG tablet Take 5 mg by mouth daily.    Marland Kitchen aspirin 81 MG chewable tablet Chew 1 tablet (81 mg total) by mouth daily.    . Biotin 1000 MCG CHEW Chew 10 mg by mouth daily.    . carvedilol (COREG) 6.25 MG tablet Take 1 tablet (6.25 mg total) by mouth 2 (two) times daily with a meal. 180 tablet 3  . CHELATED MAGNESIUM PO Take by mouth.    . Chlorphen-PE-Acetaminophen 4-10-325 MG TABS Norel AD 4 mg-10 mg-325 mg tablet  Take 1 tablet every 6 hours by oral route as needed.    . clopidogrel (PLAVIX) 75 MG tablet Take 1 tablet (75 mg total) by mouth daily with breakfast. 90 tablet 3  .  dorzolamide-timolol (COSOPT) 22.3-6.8 MG/ML ophthalmic solution Instill 1 drop in both eyes twice daily    . ezetimibe (ZETIA) 10 MG tablet Take 10 mg by mouth daily.     . hydrochlorothiazide (HYDRODIURIL) 25 MG tablet Take 25 mg by mouth daily. Currently taking 2 of the 12.33m daily    . latanoprost (XALATAN) 0.005 % ophthalmic solution Place 1 drop into both eyes at bedtime.     . nitroGLYCERIN (NITROSTAT) 0.4 MG SL tablet Place 1 tablet (0.4 mg total) under the tongue every 5 (five) minutes x 3 doses as needed for chest pain. 30 tablet 12  . osimertinib mesylate (TAGRISSO) 80 MG  tablet Take 1 tablet (80 mg total) by mouth daily. 30 tablet 3  . traMADol (ULTRAM) 50 MG tablet Take 50 mg by mouth 2 (two) times daily as needed.     .Marland KitchenVITAMIN D, CHOLECALCIFEROL, PO Take 5,000 Units by mouth daily.      No current facility-administered medications for this visit.    SURGICAL HISTORY:  Past Surgical History:  Procedure Laterality Date  . ABDOMINAL HYSTERECTOMY     partial  . CHEST TUBE INSERTION Right 01/01/2018   Procedure: INSERTION PLEURAL DRAINAGE CATHETER;  Surgeon: VIvin Poot MD;  [facility]  Service: Thoracic;  Laterality: Right;  . COLONOSCOPY    . CORONARY STENT INTERVENTION N/A 06/16/2019   Procedure: CORONARY STENT INTERVENTION;  Surgeon: VJettie Booze MD;  [facility];  Service: Cardiovascular;  Laterality: N/A;  . DILATION AND CURETTAGE OF UTERUS    . EYE SURGERY     due to Glaucoma  . LEFT HEART CATH AND CORONARY ANGIOGRAPHY N/A 06/16/2019   Procedure: LEFT HEART CATH AND CORONARY ANGIOGRAPHY;  Surgeon: VJettie Booze MD;  [facility];  Service: Cardiovascular;  Laterality: N/A;  . REMOVAL OF PLEURAL DRAINAGE CATHETER Right 11/07/2018   Procedure: REMOVAL OF PLEURAL DRAINAGE CATHETER;  Surgeon: VIvin Poot MD;  [facility]  Service: Thoracic;  Laterality: Right;  . ROTATOR CUFF REPAIR    . TUBAL LIGATION    . WISDOM TOOTH EXTRACTION      REVIEW OF SYSTEMS:  A comprehensive review of systems was negative.   PHYSICAL EXAMINATION: General appearance: alert, cooperative and no distress Head: Normocephalic, without obvious abnormality, atraumatic Neck: no adenopathy, no JVD, supple, symmetrical, trachea midline and thyroid not enlarged, symmetric, no tenderness/mass/nodules Lymph nodes: Cervical, supraclavicular, and axillary nodes normal. Resp: clear to auscultation bilaterally Back: symmetric, no curvature. ROM normal. No CVA tenderness. Cardio: regular rate and rhythm, S1, S2  normal, no murmur, click, rub or gallop GI: soft, non-tender; bowel sounds normal; no masses,  no organomegaly Extremities: extremities normal, atraumatic, no cyanosis or edema  ECOG PERFORMANCE STATUS: 1 - Symptomatic but completely ambulatory  Blood pressure 132/88, pulse (!) 53, temperature (!) 97 F (36.1 C), temperature source Tympanic, resp. rate 17, height '5\' 4"'  (1.626 m), weight 134 lb (60.8 kg).  LABORATORY DATA: Lab Results  Component Value Date   WBC 3.5 (L) 03/15/2020   HGB 12.0 03/15/2020   HCT 36.9 03/15/2020   MCV 83.1 03/15/2020   PLT 224 03/15/2020      Chemistry      Component Value Date/Time   NA 142 01/12/2020 0912   NA 137 12/09/2019 0818   K 3.9 01/12/2020 0912   CL 104 01/12/2020 0912   CO2 28 01/12/2020 0912   BUN 14 01/12/2020 0912   BUN 16  12/09/2019 0818   CREATININE 1.03 (H) 01/12/2020 0912      Component Value Date/Time   CALCIUM 10.3 01/12/2020 0912   ALKPHOS 65 01/12/2020 0912   AST 34 01/12/2020 0912   ALT 42 01/12/2020 0912   BILITOT 0.3 01/12/2020 0912       RADIOGRAPHIC STUDIES: No results found.  ASSESSMENT AND PLAN: This is a very pleasant 69 years old never smoker African-American female recently with a stage IV non-small cell lung cancer, adenocarcinoma with positive EGFR mutation with deletion in exon 19. The patient was started on treatment with Tagrisso 80 mg p.o. daily status post 25 months of treatment. The patient has been tolerating her treatment well with no concerning adverse effects. I recommended for her to continue her current treatment with Tagrisso with the same dose. I will see her back for follow-up visit in 2 months for evaluation with repeat CT scan of the chest, abdomen pelvis for restaging of her disease. She was advised to call immediately if she has any concerning symptoms in the interval. The patient voices understanding of current disease status and treatment options and is in agreement with the current  care plan. All questions were answered. The patient knows to call the clinic with any problems, questions or concerns. We can certainly see the patient much sooner if necessary.  Disclaimer: This note was dictated with voice recognition software. Similar sounding words can inadvertently be transcribed and may not be corrected upon review.

## 2020-03-16 ENCOUNTER — Other Ambulatory Visit (HOSPITAL_COMMUNITY)
Admission: RE | Admit: 2020-03-16 | Discharge: 2020-03-16 | Disposition: A | Discharge status: Home / Self Care | Payer: Medicare HMO | Source: Ambulatory Visit | Attending: Cardiology | Admitting: Cardiology

## 2020-03-16 ENCOUNTER — Telehealth (HOSPITAL_COMMUNITY): Payer: Self-pay | Admitting: *Deleted

## 2020-03-16 DIAGNOSIS — Z20822 Contact with and (suspected) exposure to covid-19: Secondary | ICD-10-CM | POA: Insufficient documentation

## 2020-03-16 DIAGNOSIS — Z01812 Encounter for preprocedural laboratory examination: Secondary | ICD-10-CM | POA: Diagnosis not present

## 2020-03-16 LAB — SARS CORONAVIRUS 2 (TAT 6-24 HRS): SARS Coronavirus 2: NEGATIVE

## 2020-03-16 NOTE — Telephone Encounter (Signed)
Patient given detailed instructions per Myocardial Perfusion Study Information Sheet for the test on 03/19/20. Patient notified to arrive 15 minutes early and that it is imperative to arrive on time for appointment to keep from having the test rescheduled.  If you need to cancel or reschedule your appointment, please call the office within 24 hours of your appointment. . Patient verbalized understanding. Allison [name]

## 2020-03-19 ENCOUNTER — Other Ambulatory Visit: Payer: Self-pay

## 2020-03-19 ENCOUNTER — Ambulatory Visit (HOSPITAL_COMMUNITY): Payer: Medicare HMO | Attending: Cardiology

## 2020-03-19 DIAGNOSIS — I25119 Atherosclerotic heart disease of native coronary artery with unspecified angina pectoris: Secondary | ICD-10-CM | POA: Diagnosis not present

## 2020-03-19 LAB — MYOCARDIAL PERFUSION IMAGING
LV dias vol: 61 mL (ref 46–106)
LV sys vol: 18 mL
Peak HR: 122 {beats}/min
Rest HR: 49 {beats}/min
SDS: 1
SRS: 2
SSS: 3
TID: 0.97

## 2020-03-19 MED ORDER — REGADENOSON 0.4 MG/5ML IV SOLN
0.4000 mg | Freq: Once | INTRAVENOUS | Status: AC
  Administered 2020-03-19: 0.4 mg via INTRAVENOUS

## 2020-03-19 MED ORDER — TECHNETIUM TC 99M TETROFOSMIN IV KIT
11.0000 | PACK | Freq: Once | INTRAVENOUS | Status: AC | PRN
  Administered 2020-03-19: 11 via INTRAVENOUS
  Filled 2020-03-19: qty 11

## 2020-03-19 MED ORDER — TECHNETIUM TC 99M TETROFOSMIN IV KIT
32.8000 | PACK | Freq: Once | INTRAVENOUS | Status: AC | PRN
  Administered 2020-03-19: 32.8 via INTRAVENOUS
  Filled 2020-03-19: qty 33

## 2020-03-22 DIAGNOSIS — R69 Illness, unspecified: Secondary | ICD-10-CM | POA: Diagnosis not present

## 2020-04-09 DIAGNOSIS — H409 Unspecified glaucoma: Secondary | ICD-10-CM | POA: Diagnosis not present

## 2020-04-09 DIAGNOSIS — C3490 Malignant neoplasm of unspecified part of unspecified bronchus or lung: Secondary | ICD-10-CM | POA: Diagnosis not present

## 2020-04-09 DIAGNOSIS — F411 Generalized anxiety disorder: Secondary | ICD-10-CM | POA: Diagnosis not present

## 2020-04-09 DIAGNOSIS — R69 Illness, unspecified: Secondary | ICD-10-CM | POA: Diagnosis not present

## 2020-04-09 DIAGNOSIS — I252 Old myocardial infarction: Secondary | ICD-10-CM | POA: Diagnosis not present

## 2020-04-09 DIAGNOSIS — K219 Gastro-esophageal reflux disease without esophagitis: Secondary | ICD-10-CM | POA: Diagnosis not present

## 2020-04-09 DIAGNOSIS — Z7902 Long term (current) use of antithrombotics/antiplatelets: Secondary | ICD-10-CM | POA: Diagnosis not present

## 2020-04-09 DIAGNOSIS — I2510 Atherosclerotic heart disease of native coronary artery without angina pectoris: Secondary | ICD-10-CM | POA: Diagnosis not present

## 2020-04-09 DIAGNOSIS — I10 Essential (primary) hypertension: Secondary | ICD-10-CM | POA: Diagnosis not present

## 2020-04-09 DIAGNOSIS — E785 Hyperlipidemia, unspecified: Secondary | ICD-10-CM | POA: Diagnosis not present

## 2020-04-22 ENCOUNTER — Ambulatory Visit (HOSPITAL_COMMUNITY)
Admission: RE | Admit: 2020-04-22 | Discharge: 2020-04-22 | Disposition: A | Discharge status: Home / Self Care | Payer: Medicare HMO | Source: Ambulatory Visit | Attending: Gastroenterology | Admitting: Gastroenterology

## 2020-04-22 ENCOUNTER — Other Ambulatory Visit (HOSPITAL_COMMUNITY): Payer: Self-pay | Admitting: Gastroenterology

## 2020-04-22 ENCOUNTER — Other Ambulatory Visit: Payer: Self-pay | Admitting: Gastroenterology

## 2020-04-22 ENCOUNTER — Other Ambulatory Visit: Payer: Self-pay

## 2020-04-22 ENCOUNTER — Encounter: Payer: Self-pay | Admitting: Internal Medicine

## 2020-04-22 DIAGNOSIS — R1033 Periumbilical pain: Secondary | ICD-10-CM | POA: Diagnosis not present

## 2020-04-22 DIAGNOSIS — K219 Gastro-esophageal reflux disease without esophagitis: Secondary | ICD-10-CM | POA: Diagnosis not present

## 2020-04-22 DIAGNOSIS — K5730 Diverticulosis of large intestine without perforation or abscess without bleeding: Secondary | ICD-10-CM | POA: Diagnosis not present

## 2020-04-22 DIAGNOSIS — R194 Change in bowel habit: Secondary | ICD-10-CM | POA: Diagnosis not present

## 2020-04-22 DIAGNOSIS — R109 Unspecified abdominal pain: Secondary | ICD-10-CM | POA: Diagnosis not present

## 2020-04-22 DIAGNOSIS — M4316 Spondylolisthesis, lumbar region: Secondary | ICD-10-CM | POA: Diagnosis not present

## 2020-04-22 DIAGNOSIS — K529 Noninfective gastroenteritis and colitis, unspecified: Secondary | ICD-10-CM | POA: Diagnosis not present

## 2020-04-22 DIAGNOSIS — K760 Fatty (change of) liver, not elsewhere classified: Secondary | ICD-10-CM | POA: Diagnosis not present

## 2020-04-22 DIAGNOSIS — K625 Hemorrhage of anus and rectum: Secondary | ICD-10-CM | POA: Diagnosis not present

## 2020-04-22 DIAGNOSIS — N3289 Other specified disorders of bladder: Secondary | ICD-10-CM | POA: Diagnosis not present

## 2020-04-22 LAB — POCT I-STAT CREATININE: Creatinine, Ser: 0.9 mg/dL (ref 0.44–1.00)

## 2020-04-22 MED ORDER — IOHEXOL 9 MG/ML PO SOLN
1000.0000 mL | ORAL | Status: AC
  Administered 2020-04-22: 1000 mL via ORAL

## 2020-04-22 MED ORDER — IOHEXOL 300 MG/ML  SOLN
100.0000 mL | Freq: Once | INTRAMUSCULAR | Status: AC | PRN
  Administered 2020-04-22: 100 mL via INTRAVENOUS

## 2020-04-23 ENCOUNTER — Telehealth: Payer: Self-pay

## 2020-04-23 NOTE — Telephone Encounter (Signed)
Pt is requesting consideration of lowering her Tagrisso from 80mg  to 40mg  in light of her rectal bleeding.  Pt was advised per Dr. Julien Nordmann, to go to the ED. Pt advised she has done so and the CT showed she had colitis.

## 2020-04-25 NOTE — Telephone Encounter (Signed)
Hold Tagrisso for a week and follow up with me or Cassie

## 2020-04-27 ENCOUNTER — Telehealth: Payer: Self-pay | Admitting: Physician Assistant

## 2020-04-27 NOTE — Telephone Encounter (Signed)
Spoke with patient, she reports having rectal bleeding and stopped taking her aspirin but has continued to take plavix.  Since she is over 10 months from her stent, discussed that she can stop her Plavix but recommend continuing aspirin 81 mg daily

## 2020-04-27 NOTE — Telephone Encounter (Signed)
New message:    Patient calling concering her medication. Please call patient.

## 2020-04-27 NOTE — Telephone Encounter (Signed)
The patient had some rectal bleeding and diarrhea last week 04/21/20 and went to her GI doctor. CT scan done at [facility] showed colitis. She was advised by her GI doctor to eat liquids then advance to soft foods and take Tylenol for abdomen pain as needed. Has not taken her aspirin since last Wednesday.  Her oncologist held her [place] for one week and she has a follow up with them. She still has some blood in her stool but feels like it is old blood. The pain is still present but is getting better. She does not have a follow up with the GI doctor. Asking if she needs to make anymore changes to medication and when she can start her aspirin back.  Patient requested Dr Gardiner Rhyme review.

## 2020-04-28 NOTE — Telephone Encounter (Signed)
04/26/20: Schedule message sent and I have advised the pt of this. She expressed understanding of this information.

## 2020-05-04 ENCOUNTER — Other Ambulatory Visit: Payer: Self-pay

## 2020-05-04 ENCOUNTER — Encounter: Payer: Self-pay | Admitting: Internal Medicine

## 2020-05-04 ENCOUNTER — Inpatient Hospital Stay: Payer: Medicare HMO | Attending: Internal Medicine | Admitting: Internal Medicine

## 2020-05-04 VITALS — BP 155/73 | HR 86 | Temp 97.8°F | Resp 18 | Ht 64.0 in | Wt 132.5 lb

## 2020-05-04 DIAGNOSIS — F419 Anxiety disorder, unspecified: Secondary | ICD-10-CM | POA: Diagnosis not present

## 2020-05-04 DIAGNOSIS — K219 Gastro-esophageal reflux disease without esophagitis: Secondary | ICD-10-CM | POA: Diagnosis not present

## 2020-05-04 DIAGNOSIS — I7300 Raynaud's syndrome without gangrene: Secondary | ICD-10-CM | POA: Insufficient documentation

## 2020-05-04 DIAGNOSIS — J45909 Unspecified asthma, uncomplicated: Secondary | ICD-10-CM | POA: Insufficient documentation

## 2020-05-04 DIAGNOSIS — F329 Major depressive disorder, single episode, unspecified: Secondary | ICD-10-CM | POA: Insufficient documentation

## 2020-05-04 DIAGNOSIS — C3491 Malignant neoplasm of unspecified part of right bronchus or lung: Secondary | ICD-10-CM

## 2020-05-04 DIAGNOSIS — F32A Depression, unspecified: Secondary | ICD-10-CM | POA: Insufficient documentation

## 2020-05-04 DIAGNOSIS — M1990 Unspecified osteoarthritis, unspecified site: Secondary | ICD-10-CM | POA: Insufficient documentation

## 2020-05-04 DIAGNOSIS — I10 Essential (primary) hypertension: Secondary | ICD-10-CM | POA: Diagnosis not present

## 2020-05-04 DIAGNOSIS — Z7982 Long term (current) use of aspirin: Secondary | ICD-10-CM | POA: Insufficient documentation

## 2020-05-04 DIAGNOSIS — Z79899 Other long term (current) drug therapy: Secondary | ICD-10-CM | POA: Diagnosis not present

## 2020-05-04 DIAGNOSIS — J910 Malignant pleural effusion: Secondary | ICD-10-CM | POA: Diagnosis not present

## 2020-05-04 DIAGNOSIS — Z7902 Long term (current) use of antithrombotics/antiplatelets: Secondary | ICD-10-CM | POA: Insufficient documentation

## 2020-05-04 DIAGNOSIS — Z23 Encounter for immunization: Secondary | ICD-10-CM | POA: Diagnosis not present

## 2020-05-04 DIAGNOSIS — C3411 Malignant neoplasm of upper lobe, right bronchus or lung: Secondary | ICD-10-CM | POA: Diagnosis not present

## 2020-05-04 DIAGNOSIS — Z5111 Encounter for antineoplastic chemotherapy: Secondary | ICD-10-CM

## 2020-05-04 DIAGNOSIS — R69 Illness, unspecified: Secondary | ICD-10-CM | POA: Diagnosis not present

## 2020-05-04 MED ORDER — INFLUENZA VAC A&B SA ADJ QUAD 0.5 ML IM PRSY
PREFILLED_SYRINGE | INTRAMUSCULAR | Status: AC
  Filled 2020-05-04: qty 0.5

## 2020-05-04 MED ORDER — OSIMERTINIB MESYLATE 80 MG PO TABS: 80.0000 mg | ORAL_TABLET | Freq: Every day | ORAL | 3 refills | Status: DC

## 2020-05-04 MED ORDER — INFLUENZA VAC A&B SA ADJ QUAD 0.5 ML IM PRSY
0.5000 mL | PREFILLED_SYRINGE | Freq: Once | INTRAMUSCULAR | Status: AC
  Administered 2020-05-04: 0.5 mL via INTRAMUSCULAR

## 2020-05-04 NOTE — Progress Notes (Signed)
[place] [phone]   Fax:(336) [phone]  OFFICE PROGRESS NOTE  [name], [place] [address]  DIAGNOSIS: Stage IV (T2 a,N2, M1a) non-small cell lung cancer, adenocarcinoma diagnosed in July 2019 and presented with right upper lobe lung mass in addition to mediastinal lymphadenopathy as well as bilateral pulmonary nodules and malignant right pleural effusion.  Biomarker Findings Microsatellite status - Cannot Be Determined Tumor Mutational Burden - Cannot Be Determined Genomic Findings For a complete list of the genes assayed, please refer to the Appendix. EGFR exon 19 deletion ([id number]) TP53 Y220C 7 Disease relevant genes with no reportable alterations: KRAS, ALK, BRAF, MET, RET, ERBB2, ROS1   PRIOR THERAPY: Status post right Pleurx catheter placement by Dr. Prescott Gum for drainage of malignant right pleural effusion.  CURRENT THERAPY: Tagrisso 80 mg p.o. daily.  First dose was given on 01/29/2018.  Status post 27 months of treatment.  INTERVAL HISTORY: Allison [name] 69 y.o. female returns to the clinic today for follow-up visit accompanied by her boyfriend.  The patient is feeling fine today with no concerning complaints except for soreness in the sacral area.  She was diagnosed with colitis in early November 2021 and she has been holding her treatment with Tagrisso since that time.  She was also on Plavix that has been discontinued but she continued with baby aspirin.  The patient denied having any current chest pain, shortness of breath, cough or hemoptysis.  She denied having any fever or chills.  She has no current nausea, vomiting, diarrhea or constipation.  She did not have any infectious work-up for the colitis and it was assumed to be related to her treatment with Tagrisso and being on Plavix.  She denied having any current weight loss or night sweats.  She is here for reevaluation before resuming  her treatment.   MEDICAL HISTORY: Past Medical History:  Diagnosis Date  . Anxiety   . Arthritis   . Asthma    exercise induced  . Depression    PMH  . GERD (gastroesophageal reflux disease)   . Glaucoma   . Hypertension   . lung ca dx'd 11/2017   right  . Malignant pleural effusion    right  . PONV (postoperative nausea and vomiting)   . Pre-diabetes   . Raynaud's disease   . Raynaud's disease     ALLERGIES:  is allergic to penicillins and vicodin [hydrocodone-acetaminophen].  MEDICATIONS:  Current Outpatient Medications  Medication Sig Dispense Refill  . acetaminophen (TYLENOL) 500 MG tablet Take 1,000 mg by mouth every 6 (six) hours as needed (pain).     . Acetylcysteine (N-ACETYL-L-CYSTEINE PO) Take 1 tablet by mouth 2 (two) times daily.    . Alirocumab (PRALUENT) 75 MG/ML SOAJ Inject 1 pen into the skin every 14 (fourteen) days. 2 mL 11  . ALPRAZolam (XANAX) 0.25 MG tablet Take 0.25 mg by mouth 2 (two) times daily as needed for anxiety.     Marland Kitchen amLODipine (NORVASC) 5 MG tablet Take 5 mg by mouth daily.    Marland Kitchen aspirin 81 MG chewable tablet Chew 1 tablet (81 mg total) by mouth daily.    . Biotin 1000 MCG CHEW Chew 10 mg by mouth daily.    . carvedilol (COREG) 6.25 MG tablet Take 1 tablet (6.25 mg total) by mouth 2 (two) times daily with a meal. 180 tablet 3  . CHELATED MAGNESIUM PO Take by mouth.    Marland Kitchen  Chlorphen-PE-Acetaminophen 4-10-325 MG TABS Norel AD 4 mg-10 mg-325 mg tablet  Take 1 tablet every 6 hours by oral route as needed.    . dorzolamide-timolol (COSOPT) 22.3-6.8 MG/ML ophthalmic solution Instill 1 drop in both eyes twice daily    . estradiol (ESTRACE) 0.1 MG/GM vaginal cream Place 1 Applicatorful vaginally 2 (two) times a week.    . ezetimibe (ZETIA) 10 MG tablet Take 10 mg by mouth daily.     . hydrochlorothiazide (HYDRODIURIL) 25 MG tablet Take 25 mg by mouth daily. Currently taking 2 of the 12.70m daily    . latanoprost (XALATAN) 0.005 % ophthalmic solution  Place 1 drop into both eyes at bedtime.     . traMADol (ULTRAM) 50 MG tablet Take 50 mg by mouth 2 (two) times daily as needed.     .Marland KitchenVITAMIN D, CHOLECALCIFEROL, PO Take 5,000 Units by mouth daily.     . clopidogrel (PLAVIX) 75 MG tablet Take 1 tablet (75 mg total) by mouth daily with breakfast. (Patient not taking: Reported on 05/04/2020) 90 tablet 3  . nitroGLYCERIN (NITROSTAT) 0.4 MG SL tablet Place 1 tablet (0.4 mg total) under the tongue every 5 (five) minutes x 3 doses as needed for chest pain. (Patient not taking: Reported on 05/04/2020) 30 tablet 12  . osimertinib mesylate (TAGRISSO) 80 MG tablet Take 1 tablet (80 mg total) by mouth daily. (Patient not taking: Reported on 05/04/2020) 30 tablet 3   No current facility-administered medications for this visit.    SURGICAL HISTORY:  Past Surgical History:  Procedure Laterality Date  . ABDOMINAL HYSTERECTOMY     partial  . CHEST TUBE INSERTION Right 01/01/2018   Procedure: INSERTION PLEURAL DRAINAGE CATHETER;  Surgeon: VIvin Poot MD;  [facility]  Service: Thoracic;  Laterality: Right;  . COLONOSCOPY    . CORONARY STENT INTERVENTION N/A 06/16/2019   Procedure: CORONARY STENT INTERVENTION;  Surgeon: VJettie Booze MD;  [facility];  Service: Cardiovascular;  Laterality: N/A;  . DILATION AND CURETTAGE OF UTERUS    . EYE SURGERY     due to Glaucoma  . LEFT HEART CATH AND CORONARY ANGIOGRAPHY N/A 06/16/2019   Procedure: LEFT HEART CATH AND CORONARY ANGIOGRAPHY;  Surgeon: VJettie Booze MD;  [facility];  Service: Cardiovascular;  Laterality: N/A;  . REMOVAL OF PLEURAL DRAINAGE CATHETER Right 11/07/2018   Procedure: REMOVAL OF PLEURAL DRAINAGE CATHETER;  Surgeon: VIvin Poot MD;  [facility]  Service: Thoracic;  Laterality: Right;  . ROTATOR CUFF REPAIR    . TUBAL LIGATION    . WISDOM TOOTH EXTRACTION      REVIEW OF SYSTEMS:  Constitutional: positive for fatigue Eyes:  negative Ears, nose, mouth, throat, and face: negative Respiratory: negative Cardiovascular: negative Gastrointestinal: negative Genitourinary:negative Integument/breast: negative Hematologic/lymphatic: negative Musculoskeletal:negative Neurological: negative Behavioral/Psych: negative Endocrine: negative Allergic/Immunologic: negative   PHYSICAL EXAMINATION: General appearance: alert, cooperative and no distress Head: Normocephalic, without obvious abnormality, atraumatic Neck: no adenopathy, no JVD, supple, symmetrical, trachea midline and thyroid not enlarged, symmetric, no tenderness/mass/nodules Lymph nodes: Cervical, supraclavicular, and axillary nodes normal. Resp: clear to auscultation bilaterally Back: symmetric, no curvature. ROM normal. No CVA tenderness. Cardio: regular rate and rhythm, S1, S2 normal, no murmur, click, rub or gallop GI: soft, non-tender; bowel sounds normal; no masses,  no organomegaly Extremities: extremities normal, atraumatic, no cyanosis or edema Neurologic: Alert and oriented X 3, normal strength and tone. Normal symmetric reflexes. Normal coordination and gait  ECOG PERFORMANCE STATUS: 1 - Symptomatic but completely ambulatory  Blood pressure (!) 155/73, pulse 86, temperature 97.8 F (36.6 C), temperature source Tympanic, resp. rate 18, height '5\' 4"'  (1.626 m), weight 132 lb 8 oz (60.1 kg), SpO2 100 %.  LABORATORY DATA: Lab Results  Component Value Date   WBC 3.5 (L) 03/15/2020   HGB 12.0 03/15/2020   HCT 36.9 03/15/2020   MCV 83.1 03/15/2020   PLT 224 03/15/2020      Chemistry      Component Value Date/Time   NA 140 03/15/2020 0808   NA 137 12/09/2019 0818   K 4.4 03/15/2020 0808   CL 103 03/15/2020 0808   CO2 30 03/15/2020 0808   BUN 12 03/15/2020 0808   BUN 16 12/09/2019 0818   CREATININE 0.90 04/22/2020 1752   CREATININE 1.01 (H) 03/15/2020 0808      Component Value Date/Time   CALCIUM 9.7 03/15/2020 0808   ALKPHOS 59  03/15/2020 0808   AST 32 03/15/2020 0808   ALT 42 03/15/2020 0808   BILITOT 0.6 03/15/2020 0808       RADIOGRAPHIC STUDIES: CT ABDOMEN PELVIS W CONTRAST  Result Date: 04/22/2020 CLINICAL DATA:  Rectal bleeding and diarrhea for 2 days EXAM: CT ABDOMEN AND PELVIS WITH CONTRAST TECHNIQUE: Multidetector CT imaging of the abdomen and pelvis was performed using the standard protocol following bolus administration of intravenous contrast. CONTRAST:  128m OMNIPAQUE IOHEXOL 300 MG/ML  SOLN COMPARISON:  01/12/2020 FINDINGS: Lower chest: Previously seen small right-sided pleural effusion has resolved in the interval. Stable nodular density is noted in the right lower lobe anteriorly along the major fissure. Hepatobiliary: Fatty infiltration of the liver is noted. The gallbladder is within normal limits. Pancreas: Unremarkable. No pancreatic ductal dilatation or surrounding inflammatory changes. Spleen: Normal in size without focal abnormality. Adrenals/Urinary Tract: Adrenal glands are within normal limits. Kidneys demonstrate a normal enhancement pattern. Delayed images demonstrate normal excretion of contrast material. No obstructive changes are seen. The bladder is partially distended. Stomach/Bowel: Colon demonstrates no obstructive changes. Wall thickening is noted in the distal descending colon and proximal sigmoid consistent with mild colitis. No appendiceal visualization is noted. No inflammatory changes to suggest appendicitis are noted. Small bowel and stomach appear within normal limits. Vascular/Lymphatic: Aortic atherosclerosis. No enlarged abdominal or pelvic lymph nodes. Reproductive: Status post hysterectomy. No adnexal masses. Other: Mild free fluid is noted within the pelvis likely reactive in nature. Musculoskeletal: No acute or significant osseous findings. Stable anterolisthesis of L4 on L5 is noted. IMPRESSION: Changes of colitis in the distal descending and proximal sigmoid colon. No areas of  active extravasation are identified. Stable nodular density in the right lower lobe along the major fissure. No other focal abnormality is noted. Electronically Signed   By: MInez CatalinaM.D.   On: 04/22/2020 18:58    ASSESSMENT AND PLAN: This is a very pleasant 69years old never smoker African-American female recently with a stage IV non-small cell lung cancer, adenocarcinoma with positive EGFR mutation with deletion in exon 19. The patient was started on treatment with Tagrisso 80 mg p.o. daily status post 27 months of treatment. The patient has been tolerating this treatment well for over 2 years except for the recent episode of bloody diarrhea and she was diagnosed with colitis on April 22, 2020.  There was no work-up to identify the etiology of her colitis and it was assumed to be related to her treatment with Tagrisso as well as being on Plavix. She  has been off [place] for the last 12 days.  She is feeling much better.  She is currently off Plavix. I recommended for the patient to resume her treatment with Tagrisso with the same dose for now. She will have repeat CT scan of the chest, abdomen pelvis later this month for reevaluation of her disease. If the patient has any recurrent episodes of colitis, I will consider reducing her dose of Tagrisso to 40 mg p.o. daily as long she is still responding to the treatment. The patient and her boyfriend agreed to the current plan. She will receive flu vaccine today. She was advised to call immediately if she has any concerning symptoms in the interval. The patient voices understanding of current disease status and treatment options and is in agreement with the current care plan. All questions were answered. The patient knows to call the clinic with any problems, questions or concerns. We can certainly see the patient much sooner if necessary.  Disclaimer: This note was dictated with voice recognition software. Similar sounding words can  inadvertently be transcribed and may not be corrected upon review.

## 2020-05-17 ENCOUNTER — Other Ambulatory Visit: Payer: Self-pay

## 2020-05-17 ENCOUNTER — Encounter: Payer: Self-pay | Admitting: Internal Medicine

## 2020-05-17 ENCOUNTER — Inpatient Hospital Stay: Payer: Medicare HMO

## 2020-05-17 ENCOUNTER — Ambulatory Visit (HOSPITAL_COMMUNITY)
Admission: RE | Admit: 2020-05-17 | Discharge: 2020-05-17 | Disposition: A | Discharge status: Home / Self Care | Payer: Medicare HMO | Source: Ambulatory Visit | Attending: Internal Medicine | Admitting: Internal Medicine

## 2020-05-17 DIAGNOSIS — N281 Cyst of kidney, acquired: Secondary | ICD-10-CM | POA: Diagnosis not present

## 2020-05-17 DIAGNOSIS — C3490 Malignant neoplasm of unspecified part of unspecified bronchus or lung: Secondary | ICD-10-CM

## 2020-05-17 DIAGNOSIS — I2510 Atherosclerotic heart disease of native coronary artery without angina pectoris: Secondary | ICD-10-CM | POA: Diagnosis not present

## 2020-05-17 DIAGNOSIS — J984 Other disorders of lung: Secondary | ICD-10-CM | POA: Diagnosis not present

## 2020-05-17 DIAGNOSIS — C3411 Malignant neoplasm of upper lobe, right bronchus or lung: Secondary | ICD-10-CM | POA: Diagnosis not present

## 2020-05-17 DIAGNOSIS — Q2546 Tortuous aortic arch: Secondary | ICD-10-CM | POA: Diagnosis not present

## 2020-05-17 DIAGNOSIS — I700 Atherosclerosis of aorta: Secondary | ICD-10-CM | POA: Diagnosis not present

## 2020-05-17 DIAGNOSIS — R69 Illness, unspecified: Secondary | ICD-10-CM | POA: Diagnosis not present

## 2020-05-17 LAB — CBC WITH DIFFERENTIAL (CANCER CENTER ONLY)
Abs Immature Granulocytes: 0.01 10*3/uL (ref 0.00–0.07)
Basophils Absolute: 0 10*3/uL (ref 0.0–0.1)
Basophils Relative: 1 %
Eosinophils Absolute: 0.1 10*3/uL (ref 0.0–0.5)
Eosinophils Relative: 3 %
HCT: 40.5 % (ref 36.0–46.0)
Hemoglobin: 12.8 g/dL (ref 12.0–15.0)
Immature Granulocytes: 0 %
Lymphocytes Relative: 40 %
Lymphs Abs: 1.9 10*3/uL (ref 0.7–4.0)
MCH: 26.1 pg (ref 26.0–34.0)
MCHC: 31.6 g/dL (ref 30.0–36.0)
MCV: 82.5 fL (ref 80.0–100.0)
Monocytes Absolute: 0.5 10*3/uL (ref 0.1–1.0)
Monocytes Relative: 10 %
Neutro Abs: 2.2 10*3/uL (ref 1.7–7.7)
Neutrophils Relative %: 46 %
Platelet Count: 216 10*3/uL (ref 150–400)
RBC: 4.91 MIL/uL (ref 3.87–5.11)
RDW: 14.4 % (ref 11.5–15.5)
WBC Count: 4.8 10*3/uL (ref 4.0–10.5)
nRBC: 0 % (ref 0.0–0.2)

## 2020-05-17 LAB — CMP (CANCER CENTER ONLY)
ALT: 38 U/L (ref 0–44)
AST: 31 U/L (ref 15–41)
Albumin: 4 g/dL (ref 3.5–5.0)
Alkaline Phosphatase: 65 U/L (ref 38–126)
Anion gap: 8 (ref 5–15)
BUN: 16 mg/dL (ref 8–23)
CO2: 29 mmol/L (ref 22–32)
Calcium: 10 mg/dL (ref 8.9–10.3)
Chloride: 104 mmol/L (ref 98–111)
Creatinine: 1.08 mg/dL — ABNORMAL HIGH (ref 0.44–1.00)
GFR, Estimated: 56 mL/min — ABNORMAL LOW (ref >60)
Glucose, Bld: 76 mg/dL (ref 70–99)
Potassium: 4.2 mmol/L (ref 3.5–5.1)
Sodium: 141 mmol/L (ref 135–145)
Total Bilirubin: 0.3 mg/dL (ref 0.3–1.2)
Total Protein: 7.9 g/dL (ref 6.5–8.1)

## 2020-05-17 MED ORDER — IOHEXOL 300 MG/ML  SOLN
100.0000 mL | Freq: Once | INTRAMUSCULAR | Status: AC | PRN
  Administered 2020-05-17: 100 mL via INTRAVENOUS

## 2020-05-18 ENCOUNTER — Inpatient Hospital Stay: Payer: Medicare HMO | Admitting: Internal Medicine

## 2020-05-18 ENCOUNTER — Telehealth: Payer: Self-pay | Admitting: Internal Medicine

## 2020-05-18 ENCOUNTER — Other Ambulatory Visit: Payer: Self-pay

## 2020-05-18 ENCOUNTER — Encounter: Payer: Self-pay | Admitting: Internal Medicine

## 2020-05-18 ENCOUNTER — Ambulatory Visit: Payer: Medicare HMO

## 2020-05-18 VITALS — BP 136/65 | HR 60 | Temp 97.7°F | Resp 18 | Ht 64.0 in | Wt 133.6 lb

## 2020-05-18 DIAGNOSIS — C3491 Malignant neoplasm of unspecified part of right bronchus or lung: Secondary | ICD-10-CM

## 2020-05-18 DIAGNOSIS — C3411 Malignant neoplasm of upper lobe, right bronchus or lung: Secondary | ICD-10-CM | POA: Diagnosis not present

## 2020-05-18 DIAGNOSIS — Z5111 Encounter for antineoplastic chemotherapy: Secondary | ICD-10-CM

## 2020-05-18 NOTE — Telephone Encounter (Signed)
Scheduled appointments per 11/30 los. Spoke to patient who is aware of appointments date and times.

## 2020-05-18 NOTE — Progress Notes (Signed)
[facility] [phone]   Fax:([phone]) [phone]  OFFICE PROGRESS NOTE  [name], [place] [address]  DIAGNOSIS: Stage IV (T2 a,N2, M1a) non-small cell lung cancer, adenocarcinoma diagnosed in July 2019 and presented with right upper lobe lung mass in addition to mediastinal lymphadenopathy as well as bilateral pulmonary nodules and malignant right pleural effusion.  Biomarker Findings Microsatellite status - Cannot Be Determined Tumor Mutational Burden - Cannot Be Determined Genomic Findings For a complete list of the genes assayed, please refer to the Appendix. EGFR exon 19 deletion ([id number]) TP53 Y220C 7 Disease relevant genes with no reportable alterations: KRAS, ALK, BRAF, MET, RET, ERBB2, ROS1   PRIOR THERAPY: Status post right Pleurx catheter placement by Dr. Prescott Gum for drainage of malignant right pleural effusion.  CURRENT THERAPY: Tagrisso 80 mg p.o. daily.  First dose was given on 01/29/2018.  Status post 27 months of treatment.  INTERVAL HISTORY: KEYANAH KOZICKI 69 y.o. female returns to the clinic today for follow-up visit accompanied by her boyfriend.  The patient is feeling fine today with no concerning complaints.  She denied having any current skin rash or diarrhea.  She has no chest pain, shortness of breath, cough or hemoptysis.  She has no nausea, vomiting, abdominal pain or constipation.  She has no headache or visual changes.  She denied having any significant weight loss or night sweats.  She continues to tolerate her treatment with Tagrisso fairly well and she has no other significant symptoms after resuming her treatment 2 weeks ago.  The patient had repeat CT scan of the chest, abdomen pelvis performed recently and she is here for evaluation and discussion of her scan results.  MEDICAL HISTORY: Past Medical History:  Diagnosis Date  . Anxiety   . Arthritis   . Asthma    exercise  induced  . Depression    PMH  . GERD (gastroesophageal reflux disease)   . Glaucoma   . Hypertension   . lung ca dx'd 11/2017   right  . Malignant pleural effusion    right  . PONV (postoperative nausea and vomiting)   . Pre-diabetes   . Raynaud's disease   . Raynaud's disease     ALLERGIES:  is allergic to penicillins and vicodin [hydrocodone-acetaminophen].  MEDICATIONS:  Current Outpatient Medications  Medication Sig Dispense Refill  . acetaminophen (TYLENOL) 500 MG tablet Take 1,000 mg by mouth every 6 (six) hours as needed (pain).     . Acetylcysteine (N-ACETYL-L-CYSTEINE PO) Take 1 tablet by mouth 2 (two) times daily.    . Alirocumab (PRALUENT) 75 MG/ML SOAJ Inject 1 pen into the skin every 14 (fourteen) days. 2 mL 11  . ALPRAZolam (XANAX) 0.25 MG tablet Take 0.25 mg by mouth 2 (two) times daily as needed for anxiety.     Marland Kitchen amLODipine (NORVASC) 5 MG tablet Take 5 mg by mouth daily.    Marland Kitchen aspirin 81 MG chewable tablet Chew 1 tablet (81 mg total) by mouth daily.    . Biotin 1000 MCG CHEW Chew 10 mg by mouth daily.    . carvedilol (COREG) 6.25 MG tablet Take 1 tablet (6.25 mg total) by mouth 2 (two) times daily with a meal. 180 tablet 3  . CHELATED MAGNESIUM PO Take by mouth.    . Chlorphen-PE-Acetaminophen 4-10-325 MG TABS Norel AD 4 mg-10 mg-325 mg tablet  Take 1 tablet every 6 hours by oral route as needed.    Marland Kitchen  clopidogrel (PLAVIX) 75 MG tablet Take 1 tablet (75 mg total) by mouth daily with breakfast. (Patient not taking: Reported on 05/04/2020) 90 tablet 3  . dorzolamide-timolol (COSOPT) 22.3-6.8 MG/ML ophthalmic solution Instill 1 drop in both eyes twice daily    . estradiol (ESTRACE) 0.1 MG/GM vaginal cream Place 1 Applicatorful vaginally 2 (two) times a week.    . ezetimibe (ZETIA) 10 MG tablet Take 10 mg by mouth daily.     . hydrochlorothiazide (HYDRODIURIL) 25 MG tablet Take 25 mg by mouth daily. Currently taking 2 of the 12.$RemoveB'5mg'HYCsbAcF$  daily    . latanoprost (XALATAN) 0.005  % ophthalmic solution Place 1 drop into both eyes at bedtime.     . nitroGLYCERIN (NITROSTAT) 0.4 MG SL tablet Place 1 tablet (0.4 mg total) under the tongue every 5 (five) minutes x 3 doses as needed for chest pain. (Patient not taking: Reported on 05/04/2020) 30 tablet 12  . osimertinib mesylate (TAGRISSO) 80 MG tablet Take 1 tablet (80 mg total) by mouth daily. 30 tablet 3  . traMADol (ULTRAM) 50 MG tablet Take 50 mg by mouth 2 (two) times daily as needed.     Marland Kitchen VITAMIN D, CHOLECALCIFEROL, PO Take 5,000 Units by mouth daily.      No current facility-administered medications for this visit.    SURGICAL HISTORY:  Past Surgical History:  Procedure Laterality Date  . ABDOMINAL HYSTERECTOMY     partial  . CHEST TUBE INSERTION Right 01/01/2018   Procedure: INSERTION PLEURAL DRAINAGE CATHETER;  Surgeon: Ivin Poot, MD;  Location: [facility];  Service: Thoracic;  Laterality: Right;  . COLONOSCOPY    . CORONARY STENT INTERVENTION N/A 06/16/2019   Procedure: CORONARY STENT INTERVENTION;  Surgeon: Jettie Booze, MD;  Location: [facility];  Service: Cardiovascular;  Laterality: N/A;  . DILATION AND CURETTAGE OF UTERUS    . EYE SURGERY     due to Glaucoma  . LEFT HEART CATH AND CORONARY ANGIOGRAPHY N/A 06/16/2019   Procedure: LEFT HEART CATH AND CORONARY ANGIOGRAPHY;  Surgeon: Jettie Booze, MD;  Location: [facility];  Service: Cardiovascular;  Laterality: N/A;  . REMOVAL OF PLEURAL DRAINAGE CATHETER Right 11/07/2018   Procedure: REMOVAL OF PLEURAL DRAINAGE CATHETER;  Surgeon: Ivin Poot, MD;  Location: [facility];  Service: Thoracic;  Laterality: Right;  . ROTATOR CUFF REPAIR    . TUBAL LIGATION    . WISDOM TOOTH EXTRACTION      REVIEW OF SYSTEMS:  Constitutional: negative Eyes: negative Ears, nose, mouth, throat, and face: negative Respiratory: negative Cardiovascular: negative Gastrointestinal: negative Genitourinary:negative Integument/breast:  negative Hematologic/lymphatic: negative Musculoskeletal:negative Neurological: negative Behavioral/Psych: negative Endocrine: negative Allergic/Immunologic: negative   PHYSICAL EXAMINATION: General appearance: alert, cooperative and no distress Head: Normocephalic, without obvious abnormality, atraumatic Neck: no adenopathy, no JVD, supple, symmetrical, trachea midline and thyroid not enlarged, symmetric, no tenderness/mass/nodules Lymph nodes: Cervical, supraclavicular, and axillary nodes normal. Resp: clear to auscultation bilaterally Back: symmetric, no curvature. ROM normal. No CVA tenderness. Cardio: regular rate and rhythm, S1, S2 normal, no murmur, click, rub or gallop GI: soft, non-tender; bowel sounds normal; no masses,  no organomegaly Extremities: extremities normal, atraumatic, no cyanosis or edema Neurologic: Alert and oriented X 3, normal strength and tone. Normal symmetric reflexes. Normal coordination and gait  ECOG PERFORMANCE STATUS: 1 - Symptomatic but completely ambulatory  Blood pressure 136/65, pulse 60, temperature 97.7 F (36.5 C), temperature source Tympanic, resp. rate 18, height $RemoveBe'5\' 4"'iJgHzBGzk$  (1.626 m), weight 133 lb  9.6 oz (60.6 kg), SpO2 100 %.  LABORATORY DATA: Lab Results  Component Value Date   WBC 4.8 05/17/2020   HGB 12.8 05/17/2020   HCT 40.5 05/17/2020   MCV 82.5 05/17/2020   PLT 216 05/17/2020      Chemistry      Component Value Date/Time   NA 141 05/17/2020 0925   NA 137 12/09/2019 0818   K 4.2 05/17/2020 0925   CL 104 05/17/2020 0925   CO2 29 05/17/2020 0925   BUN 16 05/17/2020 0925   BUN 16 12/09/2019 0818   CREATININE 1.08 (H) 05/17/2020 0925      Component Value Date/Time   CALCIUM 10.0 05/17/2020 0925   ALKPHOS 65 05/17/2020 0925   AST 31 05/17/2020 0925   ALT 38 05/17/2020 0925   BILITOT 0.3 05/17/2020 0925       RADIOGRAPHIC STUDIES: CT Chest W Contrast  Result Date: 05/17/2020 CLINICAL DATA:  Restaging non-small cell  lung cancer. EXAM: CT CHEST, ABDOMEN, AND PELVIS WITH CONTRAST TECHNIQUE: Multidetector CT imaging of the chest, abdomen and pelvis was performed following the standard protocol during bolus administration of intravenous contrast. CONTRAST:  OMNIPAQUE IOHEXOL 300 MG/ML  SOLN COMPARISON:  01/12/2020 FINDINGS: CT CHEST FINDINGS Cardiovascular: The heart is normal in size. No pericardial effusion. Stable mild tortuosity of the thoracic aorta and scattered atherosclerotic calcifications, mainly at the aortic arch. No dissection or focal aneurysm. The branch vessels are patent. Stable three-vessel coronary artery calcifications. Mediastinum/Nodes: No mediastinal or hilar mass or lymphadenopathy. The esophagus is grossly normal. Small bilateral thyroid gland nodules are noted. These measure less than 10 mm. Lungs/Pleura: Stable right upper lobe lung mass. This measures 2.6 x 1.7 cm and is unchanged. Some surrounding scarring changes are stable. Stable 8 mm right lower lobe nodule near the major fissure. No new pulmonary lesions or pulmonary nodules. No acute pulmonary process such as edema, infiltrate or effusion. Musculoskeletal: No breast masses, supraclavicular or axillary adenopathy. The bony thorax is intact. CT ABDOMEN PELVIS FINDINGS Hepatobiliary: No hepatic lesions or intrahepatic biliary dilatation. The gallbladder is normal. No common bile duct dilatation. Pancreas: No mass, inflammation or ductal dilatation. Spleen: Normal size.  No focal lesions but Adrenals/Urinary Tract: The adrenal glands are normal. Small renal cysts but no worrisome renal lesions or hydronephrosis. The bladder is unremarkable. Stomach/Bowel: The stomach, duodenum, small bowel and colon are unremarkable. No acute inflammatory changes, mass lesions or obstructive findings. Vascular/Lymphatic: The aorta is normal in caliber. No dissection. Moderate scattered atherosclerotic calcifications. The branch vessels are patent. The major  venous structures are patent. No mesenteric or retroperitoneal mass or adenopathy. Small scattered lymph nodes are noted. Reproductive: The uterus is surgically absent. Both ovaries are still present and appear normal. Other: No pelvic mass or adenopathy. No free pelvic fluid collections. No inguinal mass or adenopathy. No abdominal wall hernia or subcutaneous lesions. Musculoskeletal: No significant bony findings. No worrisome bone lesions. IMPRESSION: 1. Stable right upper lobe lung mass and stable 8 mm right lower lobe pulmonary nodule. 2. No mediastinal or hilar adenopathy, new pulmonary nodules or metastatic disease involving the abdomen/pelvis or bony structures. 3. Stable advanced atherosclerotic calcifications involving the thoracic and abdominal aorta and branch vessels including the coronary arteries. Aortic Atherosclerosis (ICD10-I70.0). Electronically Signed   By: Rudie Meyer M.D.   On: 05/17/2020 11:49   CT Abdomen Pelvis W Contrast  Result Date: 05/17/2020 CLINICAL DATA:  Restaging non-small cell lung cancer. EXAM: CT CHEST, ABDOMEN, AND PELVIS WITH CONTRAST  TECHNIQUE: Multidetector CT imaging of the chest, abdomen and pelvis was performed following the standard protocol during bolus administration of intravenous contrast. CONTRAST:  127mL OMNIPAQUE IOHEXOL 300 MG/ML  SOLN COMPARISON:  01/12/2020 FINDINGS: CT CHEST FINDINGS Cardiovascular: The heart is normal in size. No pericardial effusion. Stable mild tortuosity of the thoracic aorta and scattered atherosclerotic calcifications, mainly at the aortic arch. No dissection or focal aneurysm. The branch vessels are patent. Stable three-vessel coronary artery calcifications. Mediastinum/Nodes: No mediastinal or hilar mass or lymphadenopathy. The esophagus is grossly normal. Small bilateral thyroid gland nodules are noted. These measure less than 10 mm. Lungs/Pleura: Stable right upper lobe lung mass. This measures 2.6 x 1.7 cm and is unchanged.  Some surrounding scarring changes are stable. Stable 8 mm right lower lobe nodule near the major fissure. No new pulmonary lesions or pulmonary nodules. No acute pulmonary process such as edema, infiltrate or effusion. Musculoskeletal: No breast masses, supraclavicular or axillary adenopathy. The bony thorax is intact. CT ABDOMEN PELVIS FINDINGS Hepatobiliary: No hepatic lesions or intrahepatic biliary dilatation. The gallbladder is normal. No common bile duct dilatation. Pancreas: No mass, inflammation or ductal dilatation. Spleen: Normal size.  No focal lesions but Adrenals/Urinary Tract: The adrenal glands are normal. Small renal cysts but no worrisome renal lesions or hydronephrosis. The bladder is unremarkable. Stomach/Bowel: The stomach, duodenum, small bowel and colon are unremarkable. No acute inflammatory changes, mass lesions or obstructive findings. Vascular/Lymphatic: The aorta is normal in caliber. No dissection. Moderate scattered atherosclerotic calcifications. The branch vessels are patent. The major venous structures are patent. No mesenteric or retroperitoneal mass or adenopathy. Small scattered lymph nodes are noted. Reproductive: The uterus is surgically absent. Both ovaries are still present and appear normal. Other: No pelvic mass or adenopathy. No free pelvic fluid collections. No inguinal mass or adenopathy. No abdominal wall hernia or subcutaneous lesions. Musculoskeletal: No significant bony findings. No worrisome bone lesions. IMPRESSION: 1. Stable right upper lobe lung mass and stable 8 mm right lower lobe pulmonary nodule. 2. No mediastinal or hilar adenopathy, new pulmonary nodules or metastatic disease involving the abdomen/pelvis or bony structures. 3. Stable advanced atherosclerotic calcifications involving the thoracic and abdominal aorta and branch vessels including the coronary arteries. Aortic Atherosclerosis (ICD10-I70.0). Electronically Signed   By: Marijo Sanes M.D.   On:  05/17/2020 11:49   CT ABDOMEN PELVIS W CONTRAST  Result Date: 04/22/2020 CLINICAL DATA:  Rectal bleeding and diarrhea for 2 days EXAM: CT ABDOMEN AND PELVIS WITH CONTRAST TECHNIQUE: Multidetector CT imaging of the abdomen and pelvis was performed using the standard protocol following bolus administration of intravenous contrast. CONTRAST:  119mL OMNIPAQUE IOHEXOL 300 MG/ML  SOLN COMPARISON:  01/12/2020 FINDINGS: Lower chest: Previously seen small right-sided pleural effusion has resolved in the interval. Stable nodular density is noted in the right lower lobe anteriorly along the major fissure. Hepatobiliary: Fatty infiltration of the liver is noted. The gallbladder is within normal limits. Pancreas: Unremarkable. No pancreatic ductal dilatation or surrounding inflammatory changes. Spleen: Normal in size without focal abnormality. Adrenals/Urinary Tract: Adrenal glands are within normal limits. Kidneys demonstrate a normal enhancement pattern. Delayed images demonstrate normal excretion of contrast material. No obstructive changes are seen. The bladder is partially distended. Stomach/Bowel: Colon demonstrates no obstructive changes. Wall thickening is noted in the distal descending colon and proximal sigmoid consistent with mild colitis. No appendiceal visualization is noted. No inflammatory changes to suggest appendicitis are noted. Small bowel and stomach appear within normal limits. Vascular/Lymphatic: Aortic atherosclerosis. No enlarged  abdominal or pelvic lymph nodes. Reproductive: Status post hysterectomy. No adnexal masses. Other: Mild free fluid is noted within the pelvis likely reactive in nature. Musculoskeletal: No acute or significant osseous findings. Stable anterolisthesis of L4 on L5 is noted. IMPRESSION: Changes of colitis in the distal descending and proximal sigmoid colon. No areas of active extravasation are identified. Stable nodular density in the right lower lobe along the major fissure. No  other focal abnormality is noted. Electronically Signed   By: Inez Catalina M.D.   On: 04/22/2020 18:58    ASSESSMENT AND PLAN: This is a very pleasant 69 years old never smoker African-American female recently with a stage IV non-small cell lung cancer, adenocarcinoma with positive EGFR mutation with deletion in exon 19. The patient was started on treatment with Tagrisso 80 mg p.o. daily status post 27 months of treatment. The patient has been tolerating this treatment well with no concerning complaints.  Her treatment was interrupted for around 2 weeks because of colitis but she resumed her treatment again 2 weeks ago and she had no issues. She had repeat CT scan of the chest, abdomen pelvis.  I personally and independently reviewed the scans and discussed the results with the patient and her boyfriend today. Her scan showed no concerning findings for disease progression. I recommended for the patient to continue her current treatment with Tagrisso with the same dose. She will come back for follow-up visit in 2 months for evaluation with repeat blood work. She was advised to call immediately if she has any concerning symptoms in the interval. The patient voices understanding of current disease status and treatment options and is in agreement with the current care plan. All questions were answered. The patient knows to call the clinic with any problems, questions or concerns. We can certainly see the patient much sooner if necessary.  Disclaimer: This note was dictated with voice recognition software. Similar sounding words can inadvertently be transcribed and may not be corrected upon review.

## 2020-05-19 ENCOUNTER — Ambulatory Visit: Payer: Medicare HMO | Admitting: Cardiothoracic Surgery

## 2020-05-31 ENCOUNTER — Ambulatory Visit: Payer: Medicare HMO | Attending: Internal Medicine

## 2020-05-31 DIAGNOSIS — Z23 Encounter for immunization: Secondary | ICD-10-CM

## 2020-05-31 NOTE — Progress Notes (Signed)
   Covid-19 Vaccination Clinic  Name:  Allison [name]    MRN: [id number] DOB: [birth year]  05/31/2020  [name] was observed post Covid-19 immunization for 15 minutes without incident. She was provided with Vaccine Information Sheet and instruction to access the V-Safe system.   [name] was instructed to call 911 with any severe reactions post vaccine: Marland Kitchen Difficulty breathing  . Swelling of face and throat  . A fast heartbeat  . A bad rash all over body  . Dizziness and weakness   Immunizations Administered    Name Date Dose VIS Date Route   Pfizer COVID-19 Vaccine 05/31/2020  1:22 PM 0.3 mL 04/07/2020 Intramuscular   Manufacturer: [place]   Lot: [id number]   NDC: [address]-0

## 2020-06-09 ENCOUNTER — Ambulatory Visit: Payer: Medicare HMO | Admitting: Cardiothoracic Surgery

## 2020-06-09 DIAGNOSIS — E785 Hyperlipidemia, unspecified: Secondary | ICD-10-CM | POA: Diagnosis not present

## 2020-06-09 DIAGNOSIS — I2510 Atherosclerotic heart disease of native coronary artery without angina pectoris: Secondary | ICD-10-CM | POA: Diagnosis not present

## 2020-06-09 DIAGNOSIS — R7309 Other abnormal glucose: Secondary | ICD-10-CM | POA: Diagnosis not present

## 2020-06-09 DIAGNOSIS — Z0001 Encounter for general adult medical examination with abnormal findings: Secondary | ICD-10-CM | POA: Diagnosis not present

## 2020-06-09 DIAGNOSIS — R140 Abdominal distension (gaseous): Secondary | ICD-10-CM | POA: Diagnosis not present

## 2020-06-09 DIAGNOSIS — I10 Essential (primary) hypertension: Secondary | ICD-10-CM | POA: Diagnosis not present

## 2020-06-09 DIAGNOSIS — C3491 Malignant neoplasm of unspecified part of right bronchus or lung: Secondary | ICD-10-CM | POA: Diagnosis not present

## 2020-06-09 DIAGNOSIS — E559 Vitamin D deficiency, unspecified: Secondary | ICD-10-CM | POA: Diagnosis not present

## 2020-06-13 NOTE — Progress Notes (Signed)
Cardiology Office Note:    Date:  06/16/2020   ID:  [name], DOB Mar 21, 1951, MRN [id number]  PCP:  [name], MD  Cardiologist:  Donato Heinz, MD  Electrophysiologist:  None   Referring MD: [name], MD   Chief Complaint  Patient presents with  . Coronary Artery Disease    History of Present Illness:    Allison [name] is a 69 y.o. female with a hx of CAD status post DES to RCA 06/16/19, stage IV lung cancer, hypertension, hyperlipidemia who presents for follow-up.  She presented to ED on 06/12/2019 with chest pain, found to have NSTEMI with peak high-sensitivity troponin 9133.  TTE on 06/13/2019 showed LVEF 55 to 60%, mild LVH, low normal RV systolic function, moderate MR. Cath showed 95% proximal RCA lesion, drug-eluting stent was successfully placed.  Zio patch x13 days on 01/07/2020 showed 3 episodes of NSVT longest lasting 7 beats, 17 episodes of SVT, longest lasting 15 seconds.  At clinic visit in September 2021, she reported chest pains.  Lexiscan Myoview on 03/19/2020 showed normal perfusion, EF 70%.  Since last clinic visit, she reports that she is doing well.  Has been taking Praluent, reports some itching at the injection site but otherwise no issues.  She had blood in her stool last month, lasted about 1 day.  Saw gastroenterology, had CT abdomen/pelvis which showed colitis.  She made dietary changes and symptoms resolved.  Plavix was stopped at that time.  She has continued on aspirin 81 mg daily.  Reports she exercises 3-4 times per week at the gym, walks 30 minutes and does weight machines.  She denies any exertional chest pain or dyspnea.  Denies any lightheadedness, syncope, or lower extremity edema.  Does report occasional palpitations, lasting up to a couple minutes, occurs about twice per week.    Past Medical History:  Diagnosis Date  . Anxiety   . Arthritis   . Asthma    exercise induced  . Depression    PMH  . GERD  (gastroesophageal reflux disease)   . Glaucoma   . Hypertension   . lung ca dx'd 11/2017   right  . Malignant pleural effusion    right  . PONV (postoperative nausea and vomiting)   . Pre-diabetes   . Raynaud's disease   . Raynaud's disease     Past Surgical History:  Procedure Laterality Date  . ABDOMINAL HYSTERECTOMY     partial  . CHEST TUBE INSERTION Right 01/01/2018   Procedure: INSERTION PLEURAL DRAINAGE CATHETER;  Surgeon: Ivin Poot, MD;  Location: [place];  Service: Thoracic;  Laterality: Right;  . COLONOSCOPY    . CORONARY STENT INTERVENTION N/A 06/16/2019   Procedure: CORONARY STENT INTERVENTION;  Surgeon: Jettie Booze, MD;  Location: [facility];  Service: Cardiovascular;  Laterality: N/A;  . DILATION AND CURETTAGE OF UTERUS    . EYE SURGERY     due to Glaucoma  . LEFT HEART CATH AND CORONARY ANGIOGRAPHY N/A 06/16/2019   Procedure: LEFT HEART CATH AND CORONARY ANGIOGRAPHY;  Surgeon: Jettie Booze, MD;  Location: [place] [facility];  Service: Cardiovascular;  Laterality: N/A;  . REMOVAL OF PLEURAL DRAINAGE CATHETER Right 11/07/2018   Procedure: REMOVAL OF PLEURAL DRAINAGE CATHETER;  Surgeon: Ivin Poot, MD;  Location: [place];  Service: Thoracic;  Laterality: Right;  . ROTATOR CUFF REPAIR    . TUBAL LIGATION    . WISDOM TOOTH EXTRACTION  Current Medications: Current Meds  Medication Sig  . acetaminophen (TYLENOL) 500 MG tablet Take 1,000 mg by mouth every 6 (six) hours as needed (pain).   . Acetylcysteine (N-ACETYL-L-CYSTEINE PO) Take 1 tablet by mouth 2 (two) times daily.  . Alirocumab (PRALUENT) 75 MG/ML SOAJ Inject 1 pen into the skin every 14 (fourteen) days.  . ALPRAZolam (XANAX) 0.25 MG tablet Take 0.25 mg by mouth 2 (two) times daily as needed for anxiety.   Marland Kitchen aspirin 81 MG chewable tablet Chew 1 tablet (81 mg total) by mouth daily.  Marland Kitchen b complex vitamins capsule Take 1 capsule by mouth daily.  . Biotin 1000 MCG CHEW Chew 10  mg by mouth daily.  . carvedilol (COREG) 6.25 MG tablet Take 1 tablet (6.25 mg total) by mouth 2 (two) times daily with a meal.  . CHELATED MAGNESIUM PO Take by mouth.  . dorzolamide-timolol (COSOPT) 22.3-6.8 MG/ML ophthalmic solution Instill 1 drop in both eyes twice daily  . estradiol (ESTRACE) 0.1 MG/GM vaginal cream Place 1 Applicatorful vaginally 2 (two) times a week.  . ezetimibe (ZETIA) 10 MG tablet Take 10 mg by mouth daily.   . hydrochlorothiazide (HYDRODIURIL) 25 MG tablet Take 25 mg by mouth daily. Currently taking 2 of the 12.5mg  daily  . latanoprost (XALATAN) 0.005 % ophthalmic solution Place 1 drop into both eyes at bedtime.   . nitroGLYCERIN (NITROSTAT) 0.4 MG SL tablet Place 1 tablet (0.4 mg total) under the tongue every 5 (five) minutes x 3 doses as needed for chest pain.  Marland Kitchen osimertinib mesylate (TAGRISSO) 80 MG tablet Take 1 tablet (80 mg total) by mouth daily.  . Probiotic Product (PROBIOTIC-10 PO) Take by mouth.  . testosterone cypionate (DEPOTESTOSTERONE CYPIONATE) 200 MG/ML injection Depo-Testosterone 200 mg/mL intramuscular oil  Given:    GM  . VITAMIN D, CHOLECALCIFEROL, PO Take 5,000 Units by mouth daily.   . [DISCONTINUED] amLODipine (NORVASC) 5 MG tablet Take 5 mg by mouth daily.     Allergies:   Penicillins, Vicodin [hydrocodone-acetaminophen], and Other   Social History   Socioeconomic History  . Marital status: Widowed    Spouse name: Not on file  . Number of children: 3  . Years of education: Not on file  . Highest education level: Not on file  Occupational History    Employer: [employer]  Tobacco Use  . Smoking status: Never Smoker  . Smokeless tobacco: Never Used  Vaping Use  . Vaping Use: Never used  Substance and Sexual Activity  . Alcohol use: Yes    Comment: up to 3 drinks per week  . Drug use: No    Comment: CBD oil   . Sexual activity: Not Currently    Comment: Hysterectomy  Other Topics Concern  . Not on file  Social History Narrative   . Not on file   Social Determinants of Health   Financial Resource Strain: Low Risk   . Difficulty of Paying Living Expenses: Not hard at all  Food Insecurity: No Food Insecurity  . Worried About Charity fundraiser in the Last Year: Never true  . Ran Out of Food in the Last Year: Never true  Transportation Needs: No Transportation Needs  . Lack of Transportation (Medical): No  . Lack of Transportation (Non-Medical): No  Physical Activity: Sufficiently Active  . Days of Exercise per Week: 5 days  . Minutes of Exercise per Session: 30 min  Stress: Not on file  Social Connections: Unknown  . Frequency of  Communication with Friends and Family: Three times a week  . Frequency of Social Gatherings with Friends and Family: Once a week  . Attends Religious Services: Not on file  . Active Member of Clubs or Organizations: Yes  . Attends Archivist Meetings: More than 4 times per year  . Marital Status: Living with partner     Family History: The patient's family history includes Heart disease in her brother and sister; Lung cancer in an other family member.  ROS:   Please see the history of present illness.     All other systems reviewed and are negative.  EKGs/Labs/Other Studies Reviewed:    The following studies were reviewed today:   EKG:  EKG is ordered today.  EKG shows sinus bradycardia, rate 55, no ST abnormalities  Cath 06/16/19:  Prox LAD lesion is 25% stenosed.  Prox RCA lesion is 95% stenosed.  A drug-eluting stent was successfully placed using a SYNERGY XD [id number], postdilated to 2.5 mm.  Post intervention, there is a 0% residual stenosis.  The left ventricular systolic function is normal.  LV end diastolic pressure is normal.  The left ventricular ejection fraction is 55-65% by visual estimate.  There is no aortic valve stenosis.   Continue dual antiplatelet therapy.  Would check with Pharmacy about interactions of Plavix with Tagrisso.  If  there is an interaction, would switch to Brilinta.       TTE 06/13/19: 1. Left ventricular ejection fraction, by visual estimation, is 55 to  60%. The left ventricle has normal function. There is mildly increased  left ventricular hypertrophy.  2. The left ventricle has no regional wall motion abnormalities.  3. Global right ventricle has low normal systolic function.The right  ventricular size is normal. No increase in right ventricular wall  thickness.  4. Left atrial size was normal.  5. Right atrial size was normal.  6. Mild mitral annular calcification.  7. The mitral valve is degenerative. Moderate mitral valve regurgitation.  8. The tricuspid valve is grossly normal.  9. The aortic valve is tricuspid. Aortic valve regurgitation is not  visualized. No evidence of aortic valve sclerosis or stenosis.  10. The pulmonic valve was grossly normal. Pulmonic valve regurgitation is  not visualized.  11. Normal pulmonary artery systolic pressure.  12. The inferior vena cava is normal in size with greater than 50%  respiratory variability, suggesting right atrial pressure of 3 mmHg.   Carotid duplex 08/01/19: Right Carotid: There is no evidence of stenosis in the right ICA. The         extracranial vessels were near-normal with only minimal  wall         thickening or plaque.   Left Carotid: Velocities in the left ICA are consistent with a 1-39%  stenosis.        Non-hemodynamically significant plaque <50% noted in the  CCA.   Vertebrals: Bilateral vertebral arteries demonstrate antegrade flow.  Subclavians: Normal flow hemodynamics were seen in bilateral subclavian        arteries.   Recent Labs: 12/09/2019: Magnesium 2.1 05/17/2020: ALT 38; BUN 16; Creatinine 1.08; Hemoglobin 12.8; Platelet Count 216; Potassium 4.2; Sodium 141  Recent Lipid Panel    Component Value Date/Time   CHOL 183 12/29/2019 0817   TRIG 77 12/29/2019 0817    HDL 60 12/29/2019 0817   CHOLHDL 3.1 12/29/2019 0817   CHOLHDL 4.1 06/13/2019 0100   VLDL 13 06/13/2019 0100   LDLCALC 109 (H) 12/29/2019 2197  Physical Exam:    VS:  BP (!) 148/70   Pulse (!) 58   Ht 5\' 4"  (1.626 m)   Wt 134 lb 3.2 oz (60.9 kg)   LMP  (LMP Unknown)   BMI 23.04 kg/m     Wt Readings from Last 3 Encounters:  06/15/20 134 lb 3.2 oz (60.9 kg)  05/18/20 133 lb 9.6 oz (60.6 kg)  05/04/20 132 lb 8 oz (60.1 kg)     GEN:  Well nourished, well developed in no acute distress HEENT: Normal NECK: No JVD CARDIAC: RRR, no murmurs, rubs, gallops RESPIRATORY:  Clear to auscultation without rales, wheezing or rhonchi  ABDOMEN: Soft, non-tender, non-distended MUSCULOSKELETAL:  No edema SKIN: Warm and dry NEUROLOGIC:  Alert and oriented x 3 PSYCHIATRIC:  Normal affect   ASSESSMENT:    1. Coronary artery disease involving native coronary artery of native heart without angina pectoris   2. Essential hypertension   3. Hyperlipidemia, unspecified hyperlipidemia type   4. Palpitations    PLAN:    CAD: Status post drug-eluting stent to RCA on 06/16/2019.  At clinic visit in September 2021, she reported chest pains.  Lexiscan Myoview on 03/19/2020 showed normal perfusion, EF 70%. -Continue aspirin 81 mg daily.  Plavix was discontinued in November 2021 due to rectal bleeding  -Continue Coreg 6.25 mg twice daily -Has had issues starting statins due to transaminitis and myalgias.  Referred to lipid clinic and started on Praluent in September 2021  Palpitations: Description concerning for arrhythmia.  Zio patch x13 days on 01/07/2020 showed 3 episodes of NSVT longest lasting 7 beats, 17 episodes of SVT, longest lasting 15 seconds.  Continue carvedilol.  Carotid bruit: 1 to 39% left carotid artery stenosis, no stenosis in right coronary artery on carotid duplex 08/01/2019.  Hyperlipidemia: LDL 148 on 06/13/2019, improved to LDL 44 08/15/19 while on Lipitor 80 mg daily, but was  discontinued due to transaminitis.  She also reported having myalgias with atorvastatin.  Referred to lipid clinic, started on rosuvastatin 5 mg every other day but developed myalgias.  Was reduced to twice weekly but continued to have myalgias so was discontinued.  Started on Praluent September 2021.  Will check lipid panel.  Hypertension: On carvedilol 6.25 mg twice daily, amlodipine 5 mg daily, and hydrochlorothiazide 25 mg daily.  BP elevated in clinic today, will increase amlodipine to 10 mg daily.  Previously developed lower extremity edema on amlodipine 10 mg but was not taking HCTZ at that time.  Will give another trial of amlodipine 10 mg daily, asked to call if having edema.  Asked to check BP daily for next 2 weeks and call with results.  Will check BMP, magnesium  Stage IV lung cancer: Followed by oncology.  On Tagrisso  RTC in 3 months  Medication Adjustments/Labs and Tests Ordered: Current medicines are reviewed at length with the patient today.  Concerns regarding medicines are outlined above.  Orders Placed This Encounter  Procedures  . Lipid panel  . Basic metabolic panel  . Magnesium   Meds ordered this encounter  Medications  . amLODipine (NORVASC) 10 MG tablet    Sig: Take 1 tablet (10 mg total) by mouth daily.    Dispense:  90 tablet    Refill:  3    Dose increase    Patient Instructions  Medication Instructions:  INCREASE amlodipine to 10 mg daily  Please check your blood pressure at home daily, write it down.  Call the office or send message  via Mychart with the readings in 2 weeks for Dr. Gardiner Rhyme to review.   *If you need a refill on your cardiac medications before your next appointment, please call your pharmacy*   Lab Work: BMET, Mag, Lipid today  If you have labs (blood work) drawn today and your tests are completely normal, you will receive your results only by: Marland Kitchen MyChart Message (if you have MyChart) OR . A paper copy in the mail If you have any  lab test that is abnormal or we need to change your treatment, we will call you to review the results.   Follow-Up: At [facility], you and your health needs are our priority.  As part of our continuing mission to provide you with exceptional heart care, we have created designated Provider Care Teams.  These Care Teams include your primary Cardiologist (physician) and Advanced Practice Providers (APPs -  Physician Assistants and Nurse Practitioners) who all work together to provide you with the care you need, when you need it.  We recommend signing up for the patient portal called "MyChart".  Sign up information is provided on this After Visit Summary.  MyChart is used to connect with patients for Virtual Visits (Telemedicine).  Patients are able to view lab/test results, encounter notes, upcoming appointments, etc.  Non-urgent messages can be sent to your provider as well.   To learn more about what you can do with MyChart, go to [email or web address].    Your next appointment:   3 month(s)  The format for your next appointment:   In Person  Provider:   Dr. Gardiner Rhyme      Signed, Donato Heinz, MD  06/16/2020 12:23 AM    [place]

## 2020-06-15 ENCOUNTER — Ambulatory Visit: Payer: Medicare HMO | Admitting: Cardiology

## 2020-06-15 ENCOUNTER — Other Ambulatory Visit: Payer: Self-pay

## 2020-06-15 ENCOUNTER — Encounter: Payer: Self-pay | Admitting: Cardiology

## 2020-06-15 ENCOUNTER — Other Ambulatory Visit: Payer: Self-pay | Admitting: Pharmacist

## 2020-06-15 VITALS — BP 148/70 | HR 58 | Ht 64.0 in | Wt 134.2 lb

## 2020-06-15 DIAGNOSIS — E7800 Pure hypercholesterolemia, unspecified: Secondary | ICD-10-CM

## 2020-06-15 DIAGNOSIS — I2510 Atherosclerotic heart disease of native coronary artery without angina pectoris: Secondary | ICD-10-CM | POA: Diagnosis not present

## 2020-06-15 DIAGNOSIS — E785 Hyperlipidemia, unspecified: Secondary | ICD-10-CM

## 2020-06-15 DIAGNOSIS — R002 Palpitations: Secondary | ICD-10-CM | POA: Diagnosis not present

## 2020-06-15 DIAGNOSIS — I10 Essential (primary) hypertension: Secondary | ICD-10-CM | POA: Diagnosis not present

## 2020-06-15 MED ORDER — AMLODIPINE BESYLATE 10 MG PO TABS: 10.0000 mg | ORAL_TABLET | Freq: Every day | ORAL | 3 refills | Status: DC

## 2020-06-15 NOTE — Patient Instructions (Signed)
Medication Instructions:  INCREASE amlodipine to 10 mg daily  Please check your blood pressure at home daily, write it down.  Call the office or send message via Mychart with the readings in 2 weeks for Dr. Gardiner Rhyme to review.   *If you need a refill on your cardiac medications before your next appointment, please call your pharmacy*   Lab Work: BMET, Mag, Lipid today  If you have labs (blood work) drawn today and your tests are completely normal, you will receive your results only by: Marland Kitchen MyChart Message (if you have MyChart) OR . A paper copy in the mail If you have any lab test that is abnormal or we need to change your treatment, we will call you to review the results.   Follow-Up: At [facility], you and your health needs are our priority.  As part of our continuing mission to provide you with exceptional heart care, we have created designated Provider Care Teams.  These Care Teams include your primary Cardiologist (physician) and Advanced Practice Providers (APPs -  Physician Assistants and Nurse Practitioners) who all work together to provide you with the care you need, when you need it.  We recommend signing up for the patient portal called "MyChart".  Sign up information is provided on this After Visit Summary.  MyChart is used to connect with patients for Virtual Visits (Telemedicine).  Patients are able to view lab/test results, encounter notes, upcoming appointments, etc.  Non-urgent messages can be sent to your provider as well.   To learn more about what you can do with MyChart, go to [email or web address].    Your next appointment:   3 month(s)  The format for your next appointment:   In Person  Provider:   Dr. Gardiner Rhyme

## 2020-06-16 ENCOUNTER — Telehealth: Payer: Self-pay | Admitting: Pharmacist

## 2020-06-16 LAB — BASIC METABOLIC PANEL
BUN/Creatinine Ratio: 13 (ref 12–28)
BUN: 15 mg/dL (ref 8–27)
CO2: 28 mmol/L (ref 20–29)
Calcium: 9.6 mg/dL (ref 8.7–10.3)
Chloride: 101 mmol/L (ref 96–106)
Creatinine, Ser: 1.12 mg/dL — ABNORMAL HIGH (ref 0.57–1.00)
GFR calc Af Amer: 58 mL/min/{1.73_m2} — ABNORMAL LOW (ref >59)
GFR calc non Af Amer: 50 mL/min/{1.73_m2} — ABNORMAL LOW (ref >59)
Glucose: 91 mg/dL (ref 65–99)
Potassium: 4.5 mmol/L (ref 3.5–5.2)
Sodium: 140 mmol/L (ref 134–144)

## 2020-06-16 LAB — LIPID PANEL
Chol/HDL Ratio: 2.5 ratio (ref 0.0–4.4)
Cholesterol, Total: 157 mg/dL (ref 100–199)
HDL: 63 mg/dL (ref >39)
LDL Chol Calc (NIH): 80 mg/dL (ref 0–99)
Triglycerides: 70 mg/dL (ref 0–149)
VLDL Cholesterol Cal: 14 mg/dL (ref 5–40)

## 2020-06-16 LAB — MAGNESIUM: Magnesium: 2.1 mg/dL (ref 1.6–2.3)

## 2020-06-16 MED ORDER — PRALUENT 150 MG/ML ~~LOC~~ SOAJ: 1.0000 "pen " | SUBCUTANEOUS | 11 refills | Status: DC

## 2020-06-16 NOTE — Telephone Encounter (Signed)
LDL down to 80 from 109. Not as big of a decrease as expected. Will need to see if patient is still taking Zetia. Would recommend increasing Praluent to 150mg  q 14 days.  Called pt and LVM to return call

## 2020-06-16 NOTE — Telephone Encounter (Addendum)
Spoke to patient. She is doing well on Praluent. Still taking zetia. Is in agreement to increase praluent to 150mg  q 14 days. She just picked up the 75mg  pens. She will give both shots today (to equal 150mg ) in two different locations. Will repeat in 3 months at f/u visit with Dr. Gardiner Rhyme  Patient concerned about her scr. Advised that it is just slightly above normal and is stable over the last 6 months.

## 2020-06-16 NOTE — Telephone Encounter (Signed)
Thanks [employer]

## 2020-06-16 NOTE — Addendum Note (Signed)
Addended by: [name] Overlie D on: 06/16/2020 10:11 AM   Modules accepted: Orders

## 2020-06-21 ENCOUNTER — Other Ambulatory Visit: Payer: Medicare HMO

## 2020-06-21 DIAGNOSIS — Z20822 Contact with and (suspected) exposure to covid-19: Secondary | ICD-10-CM | POA: Diagnosis not present

## 2020-06-23 LAB — SARS-COV-2, NAA 2 DAY TAT

## 2020-06-23 LAB — NOVEL CORONAVIRUS, NAA: SARS-CoV-2, NAA: NOT DETECTED

## 2020-06-27 ENCOUNTER — Other Ambulatory Visit: Payer: Self-pay | Admitting: Physician Assistant

## 2020-06-29 ENCOUNTER — Other Ambulatory Visit: Payer: Self-pay | Admitting: *Deleted

## 2020-06-29 DIAGNOSIS — J910 Malignant pleural effusion: Secondary | ICD-10-CM

## 2020-06-30 ENCOUNTER — Other Ambulatory Visit: Payer: Self-pay

## 2020-06-30 ENCOUNTER — Ambulatory Visit: Payer: Medicare HMO | Admitting: Cardiothoracic Surgery

## 2020-06-30 ENCOUNTER — Encounter: Payer: Self-pay | Admitting: Cardiothoracic Surgery

## 2020-06-30 ENCOUNTER — Ambulatory Visit
Admission: RE | Admit: 2020-06-30 | Discharge: 2020-06-30 | Disposition: A | Discharge status: Home / Self Care | Payer: Medicare HMO | Source: Ambulatory Visit | Attending: Cardiothoracic Surgery | Admitting: Cardiothoracic Surgery

## 2020-06-30 VITALS — BP 150/70 | HR 89 | Resp 20 | Ht 64.0 in | Wt 135.0 lb

## 2020-06-30 DIAGNOSIS — J910 Malignant pleural effusion: Secondary | ICD-10-CM

## 2020-06-30 DIAGNOSIS — R918 Other nonspecific abnormal finding of lung field: Secondary | ICD-10-CM | POA: Diagnosis not present

## 2020-06-30 DIAGNOSIS — C3490 Malignant neoplasm of unspecified part of unspecified bronchus or lung: Secondary | ICD-10-CM | POA: Diagnosis not present

## 2020-06-30 NOTE — Progress Notes (Signed)
PCP is Allison Blade, MD Referring Provider is Allison Blade, MD  Chief Complaint  Patient presents with  . Lung Cancer    3 month f/u with CXR  . Pleural Effusion    3 month f/u with CXR    HPI: The patient returns with chest x-ray to assess for recurrence of malignant right pleural effusion.  The Pleurx catheter was removed May 2021.  She continues to do well on Tagrisso and remains asymptomatic.  Today's chest x-ray is personally reviewed.  There is no reaccumulation of the right pleural effusion.  The right upper lobe nodular density remains stable.   Past Medical History:  Diagnosis Date  . Anxiety   . Arthritis   . Asthma    exercise induced  . Depression    PMH  . GERD (gastroesophageal reflux disease)   . Glaucoma   . Hypertension   . lung ca dx'd 11/2017   right  . Malignant pleural effusion    right  . PONV (postoperative nausea and vomiting)   . Pre-diabetes   . Raynaud's disease   . Raynaud's disease     Past Surgical History:  Procedure Laterality Date  . ABDOMINAL HYSTERECTOMY     partial  . CHEST TUBE INSERTION Right 01/01/2018   Procedure: INSERTION PLEURAL DRAINAGE CATHETER;  Surgeon: Ivin Poot, MD;  Location: [place];  Service: Thoracic;  Laterality: Right;  . COLONOSCOPY    . CORONARY STENT INTERVENTION N/A 06/16/2019   Procedure: CORONARY STENT INTERVENTION;  Surgeon: Jettie Booze, MD;  Location: [place] [facility];  Service: Cardiovascular;  Laterality: N/A;  . DILATION AND CURETTAGE OF UTERUS    . EYE SURGERY     due to Glaucoma  . LEFT HEART CATH AND CORONARY ANGIOGRAPHY N/A 06/16/2019   Procedure: LEFT HEART CATH AND CORONARY ANGIOGRAPHY;  Surgeon: Jettie Booze, MD;  Location: [place] [facility];  Service: Cardiovascular;  Laterality: N/A;  . REMOVAL OF PLEURAL DRAINAGE CATHETER Right 11/07/2018   Procedure: REMOVAL OF PLEURAL DRAINAGE CATHETER;  Surgeon: Ivin Poot, MD;  Location: [place];  Service: Thoracic;   Laterality: Right;  . ROTATOR CUFF REPAIR    . TUBAL LIGATION    . WISDOM TOOTH EXTRACTION      Family History  Problem Relation Age of Onset  . Heart disease Sister   . Heart disease Brother   . Lung cancer Other     Social History Social History   Tobacco Use  . Smoking status: Never Smoker  . Smokeless tobacco: Never Used  Vaping Use  . Vaping Use: Never used  Substance Use Topics  . Alcohol use: Yes    Comment: up to 3 drinks per week  . Drug use: No    Comment: CBD oil     Current Outpatient Medications  Medication Sig Dispense Refill  . acetaminophen (TYLENOL) 500 MG tablet Take 1,000 mg by mouth every 6 (six) hours as needed (pain).     . Acetylcysteine (N-ACETYL-L-CYSTEINE PO) Take 1 tablet by mouth 2 (two) times daily.    . Alirocumab (PRALUENT) 150 MG/ML SOAJ Inject 1 pen into the skin every 14 (fourteen) days. 2 mL 11  . ALPRAZolam (XANAX) 0.25 MG tablet Take 0.25 mg by mouth 2 (two) times daily as needed for anxiety.     Marland Kitchen amLODipine (NORVASC) 10 MG tablet Take 1 tablet (10 mg total) by mouth daily. 90 tablet 3  . aspirin 81 MG chewable tablet Chew 1  tablet (81 mg total) by mouth daily.    Marland Kitchen b complex vitamins capsule Take 1 capsule by mouth daily.    . Biotin 1000 MCG CHEW Chew 10 mg by mouth daily.    . carvedilol (COREG) 6.25 MG tablet Take 1 tablet (6.25 mg total) by mouth 2 (two) times daily with a meal. 180 tablet 3  . CHELATED MAGNESIUM PO Take by mouth.    . dorzolamide-timolol (COSOPT) 22.3-6.8 MG/ML ophthalmic solution Instill 1 drop in both eyes twice daily    . estradiol (ESTRACE) 0.1 MG/GM vaginal cream Place 1 Applicatorful vaginally 2 (two) times a week.    . ezetimibe (ZETIA) 10 MG tablet Take 10 mg by mouth daily.     . hydrochlorothiazide (HYDRODIURIL) 25 MG tablet Take 25 mg by mouth daily. Currently taking 2 of the 12.5mg  daily    . latanoprost (XALATAN) 0.005 % ophthalmic solution Place 1 drop into both eyes at bedtime.     . nitroGLYCERIN  (NITROSTAT) 0.4 MG SL tablet Place 1 tablet (0.4 mg total) under the tongue every 5 (five) minutes x 3 doses as needed for chest pain. 30 tablet 12  . osimertinib mesylate (TAGRISSO) 80 MG tablet Take 1 tablet (80 mg total) by mouth daily. 30 tablet 3  . Probiotic Product (PROBIOTIC-10 PO) Take by mouth.    . testosterone cypionate (DEPOTESTOSTERONE CYPIONATE) 200 MG/ML injection Depo-Testosterone 200 mg/mL intramuscular oil  Given:    GM    . VITAMIN D, CHOLECALCIFEROL, PO Take 5,000 Units by mouth daily.      No current facility-administered medications for this visit.    Allergies  Allergen Reactions  . Penicillins Other (See Comments)    SYNCOPE PATIENT HAS HAD A PCN REACTION WITH IMMEDIATE RASH, FACIAL/TONGUE/THROAT SWELLING, SOB, OR LIGHTHEADEDNESS WITH HYPOTENSION:  #  #  YES  #  # Has patient had a PCN reaction causing severe rash involving mucus membranes or skin necrosis: No Has patient had a PCN reaction that required hospitalization: No Has patient had a PCN reaction occurring within the last 10 years: No   . Vicodin [Hydrocodone-Acetaminophen] Other (See Comments)    Sped up heart and breathing  . Other Other (See Comments)    Review of Systems  Stable weight and energy level Started at the gym several days a week About her elevation in blood pressure No problems after stopping her Plavix following RCA PCI 2020 BP (!) 150/70   Pulse 89   Resp 20   Ht 5\' 4"  (1.626 m)   Wt 135 lb (61.2 kg)   LMP  (LMP Unknown)   SpO2 92%   BMI 23.17 kg/m  Physical Exam      Exam    General- alert and comfortable    Neck- no JVD, no cervical adenopathy palpable, no carotid bruit   Lungs- clear without rales, wheezes   Cor- regular rate and rhythm, no murmur , gallop   Abdomen- soft, non-tender   Extremities - warm, non-tender, minimal edema   Neuro- oriented, appropriate, no focal weakness   Diagnostic Tests: Today's chest x-ray personally reviewed with results as noted  above  Impression:  resolution of right malignant pleural effusion on Tagrisso.  There is been no reaccumulation at all following Pleurx catheter removal almost 9 months ago.  The patient does not need serial chest x-rays as she will be getting chest CT scan by her oncologist every 3 to 4 months.  Plan: Patient will return to this office as  needed.   Len Childs, MD [facility] [phone]

## 2020-07-01 ENCOUNTER — Telehealth: Payer: Self-pay | Admitting: Internal Medicine

## 2020-07-01 NOTE — Telephone Encounter (Signed)
Rescheduled appointment per 1/13 schedule message due to patient's scheduling conflict. Patient is aware of changes.

## 2020-07-07 ENCOUNTER — Other Ambulatory Visit: Payer: Medicare HMO

## 2020-07-07 ENCOUNTER — Other Ambulatory Visit: Payer: Self-pay

## 2020-07-07 DIAGNOSIS — Z20822 Contact with and (suspected) exposure to covid-19: Secondary | ICD-10-CM

## 2020-07-09 LAB — SARS-COV-2, NAA 2 DAY TAT

## 2020-07-09 LAB — NOVEL CORONAVIRUS, NAA: SARS-CoV-2, NAA: NOT DETECTED

## 2020-07-19 ENCOUNTER — Other Ambulatory Visit: Payer: Medicare HMO

## 2020-07-19 ENCOUNTER — Ambulatory Visit: Payer: Medicare HMO | Admitting: Internal Medicine

## 2020-07-20 DIAGNOSIS — D229 Melanocytic nevi, unspecified: Secondary | ICD-10-CM | POA: Diagnosis not present

## 2020-07-20 DIAGNOSIS — L821 Other seborrheic keratosis: Secondary | ICD-10-CM | POA: Diagnosis not present

## 2020-07-22 ENCOUNTER — Encounter: Payer: Self-pay | Admitting: Internal Medicine

## 2020-07-23 ENCOUNTER — Encounter: Payer: Self-pay | Admitting: Licensed Clinical Social Worker

## 2020-07-23 ENCOUNTER — Telehealth: Payer: Self-pay | Admitting: *Deleted

## 2020-07-23 NOTE — Progress Notes (Signed)
[place] Work  Clinical Social Work was referred by Engineer, site, [place] D[place], after patient requested to speak with a Social worker.  Clinical Social Worker contacted patient by phone  to offer support and assess for needs.  Per patient, she is normally a joyful person and has handled her diagnosis (stage IV lung cancer) well overall, but has been struggling lately with mood. She feels lonely even when not alone, having trouble finding joy and ways to enjoy life, and not feeling like herself. This is impacting sleep and causing physical complaints. She has tried guided meditation and listening to nature sounds but it is not helping enough.  Patient is not interested in any medication but would like counseling with a female who understands oncology so she can process and also learn new coping skills. She is going to start writing down one thing she is grateful for each day.  CSW will update CSWs [place] (primary CSW for this pt) and ask them to reach out and determine if counseling can be done at [facility] or if referral is needed. Provided patient with direct contact information.     [place], [place] Worker [employer]

## 2020-07-23 NOTE — Telephone Encounter (Signed)
I received a message that Allison [name] needed me to call her. I called and spoke to her.  She is having some difficulty dealing with different life issues.  I listened as she explained.  I supported her and offered her words of encouragement.  I stated I would contact CSW to get in touch with her to provide her tools for coping.  She was thankful for the help and appreciated the assistance.

## 2020-07-28 ENCOUNTER — Encounter: Payer: Self-pay | Admitting: *Deleted

## 2020-07-28 DIAGNOSIS — H401131 Primary open-angle glaucoma, bilateral, mild stage: Secondary | ICD-10-CM | POA: Diagnosis not present

## 2020-07-28 DIAGNOSIS — H2513 Age-related nuclear cataract, bilateral: Secondary | ICD-10-CM | POA: Diagnosis not present

## 2020-07-28 DIAGNOSIS — H04123 Dry eye syndrome of bilateral lacrimal glands: Secondary | ICD-10-CM | POA: Diagnosis not present

## 2020-07-28 NOTE — Progress Notes (Signed)
[place] Clinical Social Work  [facility] representative contacted patient to offer support and follow up on counseling options.  CSW and patient discussed counseling resources at [facility] and in the community.  Patient is scheduled to see CSW on 08/09/2020.    Johnnye Lana, MSW, LCSW, OSW-C Clinical Social Worker [facility] [phone]

## 2020-08-01 ENCOUNTER — Other Ambulatory Visit: Payer: Self-pay | Admitting: Cardiology

## 2020-08-06 ENCOUNTER — Encounter: Payer: Self-pay | Admitting: Internal Medicine

## 2020-08-09 ENCOUNTER — Other Ambulatory Visit: Payer: Self-pay

## 2020-08-09 ENCOUNTER — Inpatient Hospital Stay: Payer: Medicare HMO | Attending: Internal Medicine | Admitting: *Deleted

## 2020-08-09 DIAGNOSIS — K219 Gastro-esophageal reflux disease without esophagitis: Secondary | ICD-10-CM | POA: Insufficient documentation

## 2020-08-09 DIAGNOSIS — M1990 Unspecified osteoarthritis, unspecified site: Secondary | ICD-10-CM | POA: Insufficient documentation

## 2020-08-09 DIAGNOSIS — Z79899 Other long term (current) drug therapy: Secondary | ICD-10-CM | POA: Insufficient documentation

## 2020-08-09 DIAGNOSIS — Z7982 Long term (current) use of aspirin: Secondary | ICD-10-CM | POA: Insufficient documentation

## 2020-08-09 DIAGNOSIS — F419 Anxiety disorder, unspecified: Secondary | ICD-10-CM | POA: Insufficient documentation

## 2020-08-09 DIAGNOSIS — C3411 Malignant neoplasm of upper lobe, right bronchus or lung: Secondary | ICD-10-CM | POA: Insufficient documentation

## 2020-08-09 DIAGNOSIS — C3491 Malignant neoplasm of unspecified part of right bronchus or lung: Secondary | ICD-10-CM

## 2020-08-09 DIAGNOSIS — I7300 Raynaud's syndrome without gangrene: Secondary | ICD-10-CM | POA: Insufficient documentation

## 2020-08-09 DIAGNOSIS — F32A Depression, unspecified: Secondary | ICD-10-CM | POA: Insufficient documentation

## 2020-08-09 DIAGNOSIS — I10 Essential (primary) hypertension: Secondary | ICD-10-CM | POA: Insufficient documentation

## 2020-08-09 NOTE — Progress Notes (Signed)
[place] Work  Clinical Social Work met with patient in [place] office for scheduled counseling session.  CSW provided support as patient processed her treatment journey and the emotional stressors associated with a cancer diagnosis and life after diagnosis.  CSW and patient discussed tools and interventions to reduce anxiety and distress.  CSW and patient also discussed counseling/therapy options in the community.  Patient plans to contact CSW in 2 weeks to schedule a follow up session.   Johnnye Lana, MSW, LCSW, OSW-C Clinical Social Worker [facility] [phone]

## 2020-08-16 ENCOUNTER — Inpatient Hospital Stay: Payer: Medicare HMO | Admitting: Internal Medicine

## 2020-08-16 ENCOUNTER — Other Ambulatory Visit: Payer: Self-pay

## 2020-08-16 ENCOUNTER — Inpatient Hospital Stay: Payer: Medicare HMO

## 2020-08-16 ENCOUNTER — Telehealth: Payer: Self-pay | Admitting: Internal Medicine

## 2020-08-16 ENCOUNTER — Encounter: Payer: Self-pay | Admitting: Internal Medicine

## 2020-08-16 DIAGNOSIS — C3490 Malignant neoplasm of unspecified part of unspecified bronchus or lung: Secondary | ICD-10-CM | POA: Diagnosis not present

## 2020-08-16 DIAGNOSIS — I7300 Raynaud's syndrome without gangrene: Secondary | ICD-10-CM | POA: Diagnosis not present

## 2020-08-16 DIAGNOSIS — I10 Essential (primary) hypertension: Secondary | ICD-10-CM | POA: Diagnosis not present

## 2020-08-16 DIAGNOSIS — M1990 Unspecified osteoarthritis, unspecified site: Secondary | ICD-10-CM | POA: Diagnosis not present

## 2020-08-16 DIAGNOSIS — F32A Depression, unspecified: Secondary | ICD-10-CM | POA: Diagnosis not present

## 2020-08-16 DIAGNOSIS — F419 Anxiety disorder, unspecified: Secondary | ICD-10-CM | POA: Diagnosis not present

## 2020-08-16 DIAGNOSIS — Z79899 Other long term (current) drug therapy: Secondary | ICD-10-CM | POA: Diagnosis not present

## 2020-08-16 DIAGNOSIS — C3491 Malignant neoplasm of unspecified part of right bronchus or lung: Secondary | ICD-10-CM

## 2020-08-16 DIAGNOSIS — R69 Illness, unspecified: Secondary | ICD-10-CM | POA: Diagnosis not present

## 2020-08-16 DIAGNOSIS — C3411 Malignant neoplasm of upper lobe, right bronchus or lung: Secondary | ICD-10-CM | POA: Diagnosis not present

## 2020-08-16 DIAGNOSIS — K219 Gastro-esophageal reflux disease without esophagitis: Secondary | ICD-10-CM | POA: Diagnosis not present

## 2020-08-16 DIAGNOSIS — Z7982 Long term (current) use of aspirin: Secondary | ICD-10-CM | POA: Diagnosis not present

## 2020-08-16 LAB — CMP (CANCER CENTER ONLY)
ALT: 32 U/L (ref 0–44)
AST: 29 U/L (ref 15–41)
Albumin: 3.8 g/dL (ref 3.5–5.0)
Alkaline Phosphatase: 58 U/L (ref 38–126)
Anion gap: 7 (ref 5–15)
BUN: 15 mg/dL (ref 8–23)
CO2: 30 mmol/L (ref 22–32)
Calcium: 9.5 mg/dL (ref 8.9–10.3)
Chloride: 104 mmol/L (ref 98–111)
Creatinine: 1.03 mg/dL — ABNORMAL HIGH (ref 0.44–1.00)
GFR, Estimated: 59 mL/min — ABNORMAL LOW (ref >60)
Glucose, Bld: 92 mg/dL (ref 70–99)
Potassium: 3.6 mmol/L (ref 3.5–5.1)
Sodium: 141 mmol/L (ref 135–145)
Total Bilirubin: 0.4 mg/dL (ref 0.3–1.2)
Total Protein: 7.7 g/dL (ref 6.5–8.1)

## 2020-08-16 LAB — CBC WITH DIFFERENTIAL (CANCER CENTER ONLY)
Abs Immature Granulocytes: 0.01 10*3/uL (ref 0.00–0.07)
Basophils Absolute: 0 10*3/uL (ref 0.0–0.1)
Basophils Relative: 1 %
Eosinophils Absolute: 0.1 10*3/uL (ref 0.0–0.5)
Eosinophils Relative: 3 %
HCT: 36.6 % (ref 36.0–46.0)
Hemoglobin: 11.4 g/dL — ABNORMAL LOW (ref 12.0–15.0)
Immature Granulocytes: 0 %
Lymphocytes Relative: 35 %
Lymphs Abs: 1.5 10*3/uL (ref 0.7–4.0)
MCH: 25.6 pg — ABNORMAL LOW (ref 26.0–34.0)
MCHC: 31.1 g/dL (ref 30.0–36.0)
MCV: 82.2 fL (ref 80.0–100.0)
Monocytes Absolute: 0.4 10*3/uL (ref 0.1–1.0)
Monocytes Relative: 10 %
Neutro Abs: 2.2 10*3/uL (ref 1.7–7.7)
Neutrophils Relative %: 51 %
Platelet Count: 224 10*3/uL (ref 150–400)
RBC: 4.45 MIL/uL (ref 3.87–5.11)
RDW: 14.5 % (ref 11.5–15.5)
WBC Count: 4.3 10*3/uL (ref 4.0–10.5)
nRBC: 0 % (ref 0.0–0.2)

## 2020-08-16 NOTE — Progress Notes (Signed)
[place] Telephone:([phone]   Fax:([phone]  OFFICE PROGRESS NOTE  [name], [place] [address]  DIAGNOSIS: Stage IV (T2 a,N2, M1a) non-small cell lung cancer, adenocarcinoma diagnosed in July 2019 and presented with right upper lobe lung mass in addition to mediastinal lymphadenopathy as well as bilateral pulmonary nodules and malignant right pleural effusion.  Biomarker Findings Microsatellite status - Cannot Be Determined Tumor Mutational Burden - Cannot Be Determined Genomic Findings For a complete list of the genes assayed, please refer to the Appendix. EGFR exon 19 deletion ([id number]) TP53 Y220C 7 Disease relevant genes with no reportable alterations: KRAS, ALK, BRAF, MET, RET, ERBB2, ROS1   PRIOR THERAPY: Status post right Pleurx catheter placement by Dr. Prescott Gum for drainage of malignant right pleural effusion.  CURRENT THERAPY: Tagrisso 80 mg p.o. daily.  First dose was given on 01/29/2018.  Status post 27 months of treatment.  INTERVAL HISTORY: Allison [name] 70 y.o. female returns to the clinic today for follow-up visit accompanied by her boyfriend.  The patient is feeling fine today with no concerning complaints.  She denied having any chest pain, shortness of breath, cough or hemoptysis.  She denied having any fever or chills.  She has no nausea, vomiting, diarrhea or constipation.  She has no headache or visual changes.  She denied having any weight loss or night sweats.  She continues to tolerate her treatment with Tagrisso fairly well.  MEDICAL HISTORY: Past Medical History:  Diagnosis Date  . Anxiety   . Arthritis   . Asthma    exercise induced  . Depression    PMH  . GERD (gastroesophageal reflux disease)   . Glaucoma   . Hypertension   . lung ca dx'd 11/2017   right  . Malignant pleural effusion    right  . PONV (postoperative nausea and vomiting)   . Pre-diabetes   .  Raynaud's disease   . Raynaud's disease     ALLERGIES:  is allergic to penicillins, vicodin [hydrocodone-acetaminophen], and other.  MEDICATIONS:  Current Outpatient Medications  Medication Sig Dispense Refill  . acetaminophen (TYLENOL) 500 MG tablet Take 1,000 mg by mouth every 6 (six) hours as needed (pain).     . Acetylcysteine (N-ACETYL-L-CYSTEINE PO) Take 1 tablet by mouth 2 (two) times daily.    . Alirocumab (PRALUENT) 150 MG/ML SOAJ Inject 1 pen into the skin every 14 (fourteen) days. 2 mL 11  . Alirocumab 150 MG/ML SOAJ     . ALPRAZolam (XANAX) 0.25 MG tablet Take 0.25 mg by mouth 2 (two) times daily as needed for anxiety.     Marland Kitchen amLODipine (NORVASC) 10 MG tablet Take 1 tablet (10 mg total) by mouth daily. 90 tablet 3  . aspirin 81 MG chewable tablet Chew 1 tablet (81 mg total) by mouth daily.    Marland Kitchen b complex vitamins capsule Take 1 capsule by mouth daily.    . Biotin 1000 MCG CHEW Chew 10 mg by mouth daily.    . carvedilol (COREG) 6.25 MG tablet Take 1 tablet (6.25 mg total) by mouth 2 (two) times daily with a meal. Please schedule appointment for future refills. Thank you 180 tablet 1  . CHELATED MAGNESIUM PO Take by mouth.    . dorzolamide-timolol (COSOPT) 22.3-6.8 MG/ML ophthalmic solution Instill 1 drop in both eyes twice daily    . estradiol (ESTRACE) 0.1 MG/GM vaginal cream Place 1 Applicatorful vaginally 2 (two) times  a week.    . ezetimibe (ZETIA) 10 MG tablet Take 10 mg by mouth daily.     . hydrochlorothiazide (HYDRODIURIL) 25 MG tablet Take 25 mg by mouth daily. Currently taking 2 of the 12.68m daily    . latanoprost (XALATAN) 0.005 % ophthalmic solution Place 1 drop into both eyes at bedtime.     . nitroGLYCERIN (NITROSTAT) 0.4 MG SL tablet Place 1 tablet (0.4 mg total) under the tongue every 5 (five) minutes x 3 doses as needed for chest pain. 30 tablet 12  . osimertinib mesylate (TAGRISSO) 80 MG tablet Take 1 tablet (80 mg total) by mouth daily. 30 tablet 3  .  Probiotic Product (PROBIOTIC-10 PO) Take by mouth.    . RESTASIS 0.05 % ophthalmic emulsion 1 drop 2 (two) times daily.    .Marland Kitchentestosterone cypionate (DEPOTESTOSTERONE CYPIONATE) 200 MG/ML injection Depo-Testosterone 200 mg/mL intramuscular oil  Given:    GM    . VITAMIN D, CHOLECALCIFEROL, PO Take 5,000 Units by mouth daily.      No current facility-administered medications for this visit.    SURGICAL HISTORY:  Past Surgical History:  Procedure Laterality Date  . ABDOMINAL HYSTERECTOMY     partial  . CHEST TUBE INSERTION Right 01/01/2018   Procedure: INSERTION PLEURAL DRAINAGE CATHETER;  Surgeon: VIvin Poot MD;  [facility]  Service: Thoracic;  Laterality: Right;  . COLONOSCOPY    . CORONARY STENT INTERVENTION N/A 06/16/2019   Procedure: CORONARY STENT INTERVENTION;  Surgeon: VJettie Booze MD;  [facility];  Service: Cardiovascular;  Laterality: N/A;  . DILATION AND CURETTAGE OF UTERUS    . EYE SURGERY     due to Glaucoma  . LEFT HEART CATH AND CORONARY ANGIOGRAPHY N/A 06/16/2019   Procedure: LEFT HEART CATH AND CORONARY ANGIOGRAPHY;  Surgeon: VJettie Booze MD;  [facility];  Service: Cardiovascular;  Laterality: N/A;  . REMOVAL OF PLEURAL DRAINAGE CATHETER Right 11/07/2018   Procedure: REMOVAL OF PLEURAL DRAINAGE CATHETER;  Surgeon: VIvin Poot MD;  [facility]  Service: Thoracic;  Laterality: Right;  . ROTATOR CUFF REPAIR    . TUBAL LIGATION    . WISDOM TOOTH EXTRACTION      REVIEW OF SYSTEMS:  A comprehensive review of systems was negative.   PHYSICAL EXAMINATION: General appearance: alert, cooperative and no distress Head: Normocephalic, without obvious abnormality, atraumatic Neck: no adenopathy, no JVD, supple, symmetrical, trachea midline and thyroid not enlarged, symmetric, no tenderness/mass/nodules Lymph nodes: Cervical, supraclavicular, and axillary nodes normal. Resp: clear to auscultation  bilaterally Back: symmetric, no curvature. ROM normal. No CVA tenderness. Cardio: regular rate and rhythm, S1, S2 normal, no murmur, click, rub or gallop GI: soft, non-tender; bowel sounds normal; no masses,  no organomegaly Extremities: extremities normal, atraumatic, no cyanosis or edema  ECOG PERFORMANCE STATUS: 1 - Symptomatic but completely ambulatory  Blood pressure (!) 124/58, pulse 88, temperature (!) 97.1 F (36.2 C), temperature source Tympanic, resp. rate 15, height '5\' 4"'  (1.626 m), weight 135 lb 9.6 oz (61.5 kg).  LABORATORY DATA: Lab Results  Component Value Date   WBC 4.3 08/16/2020   HGB 11.4 (L) 08/16/2020   HCT 36.6 08/16/2020   MCV 82.2 08/16/2020   PLT 224 08/16/2020      Chemistry      Component Value Date/Time   NA 141 08/16/2020 0834   NA 140 06/15/2020 0853   K 3.6 08/16/2020 0834   CL 104 08/16/2020  0834   CO2 30 08/16/2020 0834   BUN 15 08/16/2020 0834   BUN 15 06/15/2020 0853   CREATININE 1.03 (H) 08/16/2020 0834      Component Value Date/Time   CALCIUM 9.5 08/16/2020 0834   ALKPHOS 58 08/16/2020 0834   AST 29 08/16/2020 0834   ALT 32 08/16/2020 0834   BILITOT 0.4 08/16/2020 0834       RADIOGRAPHIC STUDIES: No results found.  ASSESSMENT AND PLAN: This is a very pleasant 70 years old never smoker African-American female recently with a stage IV non-small cell lung cancer, adenocarcinoma with positive EGFR mutation with deletion in exon 19. The patient was started on treatment with Tagrisso 80 mg p.o. daily status post 29 months of treatment. The patient has been tolerating this treatment well with no concerning adverse effects. I recommended her to continue her current treatment with Tagrisso with the same dose. I will see her back for follow-up visit in 2 months for evaluation with repeat CT scan of the chest, abdomen pelvis for restaging of her disease. The patient was advised to call immediately if she has any concerning symptoms in the  interval.  The patient voices understanding of current disease status and treatment options and is in agreement with the current care plan. All questions were answered. The patient knows to call the clinic with any problems, questions or concerns. We can certainly see the patient much sooner if necessary.  Disclaimer: This note was dictated with voice recognition software. Similar sounding words can inadvertently be transcribed and may not be corrected upon review.

## 2020-08-16 NOTE — Telephone Encounter (Signed)
Scheduled appointments per 2/28 los. Spoke to patient who is aware of appointments date and times. Gave patient calendar print out.

## 2020-09-10 ENCOUNTER — Encounter: Payer: Self-pay | Admitting: Internal Medicine

## 2020-09-12 NOTE — Progress Notes (Signed)
Cardiology Office Note:    Date:  09/13/2020   ID:  Allison [name], DOB 1951/05/17, MRN [id number]  PCP:  [name], MD  Cardiologist:  Donato Heinz, MD  Electrophysiologist:  None   Referring MD: [name], MD   Chief Complaint  Patient presents with  . Coronary Artery Disease    History of Present Illness:    Allison [name] is a 70 y.o. female with a hx of CAD status post DES to RCA 06/16/19, stage IV lung cancer, hypertension, hyperlipidemia who presents for follow-up.  She presented to ED on 06/12/2019 with chest pain, found to have NSTEMI with peak high-sensitivity troponin 9133.  TTE on 06/13/2019 showed LVEF 55 to 60%, mild LVH, low normal RV systolic function, moderate MR. Cath showed 95% proximal RCA lesion, drug-eluting stent was successfully placed.  Zio patch x13 days on 01/07/2020 showed 3 episodes of NSVT longest lasting 7 beats, 17 episodes of SVT, longest lasting 15 seconds.  At clinic visit in September 2021, she reported chest pains.  Lexiscan Myoview on 03/19/2020 showed normal perfusion, EF 70%.  Since last clinic visit, she reports that she is doing well.  Denies any chest pain, dyspnea, lightheadedness, syncope, or palpitations.  Does report some ankle edema.  Has been exercising, no exertional symptoms.  Has been taking Praluent, reports some itching at injection site but otherwise no complaints.   Past Medical History:  Diagnosis Date  . Anxiety   . Arthritis   . Asthma    exercise induced  . Depression    PMH  . GERD (gastroesophageal reflux disease)   . Glaucoma   . Hypertension   . lung ca dx'd 11/2017   right  . Malignant pleural effusion    right  . PONV (postoperative nausea and vomiting)   . Pre-diabetes   . Raynaud's disease   . Raynaud's disease     Past Surgical History:  Procedure Laterality Date  . ABDOMINAL HYSTERECTOMY     partial  . CHEST TUBE INSERTION Right 01/01/2018   Procedure: INSERTION PLEURAL  DRAINAGE CATHETER;  Surgeon: Ivin Poot, MD;  Location: [facility];  Service: Thoracic;  Laterality: Right;  . COLONOSCOPY    . CORONARY STENT INTERVENTION N/A 06/16/2019   Procedure: CORONARY STENT INTERVENTION;  Surgeon: Jettie Booze, MD;  Location: [facility];  Service: Cardiovascular;  Laterality: N/A;  . DILATION AND CURETTAGE OF UTERUS    . EYE SURGERY     due to Glaucoma  . LEFT HEART CATH AND CORONARY ANGIOGRAPHY N/A 06/16/2019   Procedure: LEFT HEART CATH AND CORONARY ANGIOGRAPHY;  Surgeon: Jettie Booze, MD;  Location: [facility];  Service: Cardiovascular;  Laterality: N/A;  . REMOVAL OF PLEURAL DRAINAGE CATHETER Right 11/07/2018   Procedure: REMOVAL OF PLEURAL DRAINAGE CATHETER;  Surgeon: Ivin Poot, MD;  Location: [facility];  Service: Thoracic;  Laterality: Right;  . ROTATOR CUFF REPAIR    . TUBAL LIGATION    . WISDOM TOOTH EXTRACTION      Current Medications: Current Meds  Medication Sig  . acetaminophen (TYLENOL) 500 MG tablet Take 1,000 mg by mouth every 6 (six) hours as needed (pain).   . Acetylcysteine (N-ACETYL-L-CYSTEINE PO) Take 1 tablet by mouth 2 (two) times daily.  . Alirocumab (PRALUENT) 150 MG/ML SOAJ Inject 1 pen into the skin every 14 (fourteen) days.  . ALPRAZolam (XANAX) 0.25 MG tablet Take 0.25 mg by mouth 2 (two) times daily as needed  for anxiety.   Marland Kitchen amLODipine (NORVASC) 10 MG tablet Take 1 tablet (10 mg total) by mouth daily.  Marland Kitchen aspirin 81 MG chewable tablet Chew 1 tablet (81 mg total) by mouth daily.  Marland Kitchen b complex vitamins capsule Take 1 capsule by mouth daily.  . Biotin 1000 MCG CHEW Chew 10 mg by mouth daily.  . carvedilol (COREG) 6.25 MG tablet Take 1 tablet (6.25 mg total) by mouth 2 (two) times daily with a meal. Please schedule appointment for future refills. Thank you  . dorzolamide-timolol (COSOPT) 22.3-6.8 MG/ML ophthalmic solution Instill 1 drop in both eyes twice daily  . estradiol (ESTRACE) 0.1 MG/GM vaginal  cream Place 1 Applicatorful vaginally 2 (two) times a week.  . ezetimibe (ZETIA) 10 MG tablet Take 10 mg by mouth daily.   . hydrochlorothiazide (HYDRODIURIL) 25 MG tablet Take 25 mg by mouth daily. Currently taking 2 of the 12.5mg  daily  . latanoprost (XALATAN) 0.005 % ophthalmic solution Place 1 drop into both eyes at bedtime.   Marland Kitchen osimertinib mesylate (TAGRISSO) 80 MG tablet Take 1 tablet (80 mg total) by mouth daily.  . Probiotic Product (PROBIOTIC-10 PO) Take by mouth.  . RESTASIS 0.05 % ophthalmic emulsion 1 drop 2 (two) times daily.  Marland Kitchen testosterone cypionate (DEPOTESTOSTERONE CYPIONATE) 200 MG/ML injection Depo-Testosterone 200 mg/mL intramuscular oil  Given:    GM  . VITAMIN D-VITAMIN K PO Take 1 tablet by mouth daily.     Allergies:   Penicillins, Vicodin [hydrocodone-acetaminophen], and Other   Social History   Socioeconomic History  . Marital status: Widowed    Spouse name: Not on file  . Number of children: 3  . Years of education: Not on file  . Highest education level: Not on file  Occupational History    Employer: [employer]  Tobacco Use  . Smoking status: Never Smoker  . Smokeless tobacco: Never Used  Vaping Use  . Vaping Use: Never used  Substance and Sexual Activity  . Alcohol use: Yes    Comment: up to 3 drinks per week  . Drug use: No    Comment: CBD oil   . Sexual activity: Not Currently    Comment: Hysterectomy  Other Topics Concern  . Not on file  Social History Narrative  . Not on file   Social Determinants of Health   Financial Resource Strain: Not on file  Food Insecurity: Not on file  Transportation Needs: Not on file  Physical Activity: Not on file  Stress: Not on file  Social Connections: Not on file     Family History: The patient's family history includes Heart disease in her brother and sister; Lung cancer in an other family member.  ROS:   Please see the history of present illness.     All other systems reviewed and are  negative.  EKGs/Labs/Other Studies Reviewed:    The following studies were reviewed today:   EKG:  EKG is not ordered today.  EKG at prior clinic visit shows sinus bradycardia, rate 55, no ST abnormalities  Cath 06/16/19:  Prox LAD lesion is 25% stenosed.  Prox RCA lesion is 95% stenosed.  A drug-eluting stent was successfully placed using a SYNERGY XD 2.25X28, postdilated to 2.5 mm.  Post intervention, there is a 0% residual stenosis.  The left ventricular systolic function is normal.  LV end diastolic pressure is normal.  The left ventricular ejection fraction is 55-65% by visual estimate.  There is no aortic valve stenosis.   Continue  dual antiplatelet therapy.  Would check with Pharmacy about interactions of Plavix with Tagrisso.  If there is an interaction, would switch to Brilinta.       TTE 06/13/19: 1. Left ventricular ejection fraction, by visual estimation, is 55 to  60%. The left ventricle has normal function. There is mildly increased  left ventricular hypertrophy.  2. The left ventricle has no regional wall motion abnormalities.  3. Global right ventricle has low normal systolic function.The right  ventricular size is normal. No increase in right ventricular wall  thickness.  4. Left atrial size was normal.  5. Right atrial size was normal.  6. Mild mitral annular calcification.  7. The mitral valve is degenerative. Moderate mitral valve regurgitation.  8. The tricuspid valve is grossly normal.  9. The aortic valve is tricuspid. Aortic valve regurgitation is not  visualized. No evidence of aortic valve sclerosis or stenosis.  10. The pulmonic valve was grossly normal. Pulmonic valve regurgitation is  not visualized.  11. Normal pulmonary artery systolic pressure.  12. The inferior vena cava is normal in size with greater than 50%  respiratory variability, suggesting right atrial pressure of 3 mmHg.   Carotid duplex 08/01/19: Right Carotid:  There is no evidence of stenosis in the right ICA. The         extracranial vessels were near-normal with only minimal  wall         thickening or plaque.   Left Carotid: Velocities in the left ICA are consistent with a 1-39%  stenosis.        Non-hemodynamically significant plaque <50% noted in the  CCA.   Vertebrals: Bilateral vertebral arteries demonstrate antegrade flow.  Subclavians: Normal flow hemodynamics were seen in bilateral subclavian        arteries.   Recent Labs: 06/15/2020: Magnesium 2.1 08/16/2020: ALT 32; BUN 15; Creatinine 1.03; Hemoglobin 11.4; Platelet Count 224; Potassium 3.6; Sodium 141  Recent Lipid Panel    Component Value Date/Time   CHOL 157 06/15/2020 0853   TRIG 70 06/15/2020 0853   HDL 63 06/15/2020 0853   CHOLHDL 2.5 06/15/2020 0853   CHOLHDL 4.1 06/13/2019 0100   VLDL 13 06/13/2019 0100   LDLCALC 80 06/15/2020 0853    Physical Exam:    VS:  BP 122/70   Pulse 60   Ht 5\' 4"  (1.626 m)   Wt 131 lb (59.4 kg)   LMP  (LMP Unknown)   SpO2 99%   BMI 22.49 kg/m     Wt Readings from Last 3 Encounters:  09/13/20 131 lb (59.4 kg)  08/16/20 135 lb 9.6 oz (61.5 kg)  06/30/20 135 lb (61.2 kg)     GEN:  Well nourished, well developed in no acute distress HEENT: Normal NECK: No JVD CARDIAC: RRR, no murmurs, rubs, gallops RESPIRATORY:  Clear to auscultation without rales, wheezing or rhonchi  ABDOMEN: Soft, non-tender, non-distended MUSCULOSKELETAL:  No edema SKIN: Warm and dry NEUROLOGIC:  Alert and oriented x 3 PSYCHIATRIC:  Normal affect   ASSESSMENT:    1. Coronary artery disease involving native coronary artery of native heart without angina pectoris   2. Hyperlipidemia, unspecified hyperlipidemia type   3. Essential hypertension   4. Preop cardiovascular exam    PLAN:    CAD: Status post drug-eluting stent to RCA on 06/16/2019.  At clinic visit in September 2021, she reported chest pains.  Lexiscan  Myoview on 03/19/2020 showed normal perfusion, EF 70%. -Continue aspirin 81 mg daily.  Plavix was  discontinued in November 2021 due to rectal bleeding  -Continue Coreg 6.25 mg twice daily -Has had issues starting statins due to transaminitis and myalgias.  Referred to lipid clinic and started on Praluent in September 2021.  LDL 80 06/15/2020, Praluent dose increased to 150 mg every 14 days  Preop evaluation: prior to colonoscopy.  Denies any chest pain or dyspnea.  Low risk procedure, no further cardiac work-up recommended prior to procedure.  Recommend continuing aspirin periprocedurally given her history of cardiac stenting  Palpitations: Description concerning for arrhythmia.  Zio patch x13 days on 01/07/2020 showed 3 episodes of NSVT longest lasting 7 beats, 17 episodes of SVT, longest lasting 15 seconds.  Continue carvedilol.  Carotid bruit: 1 to 39% left carotid artery stenosis, no stenosis in right coronary artery on carotid duplex 08/01/2019.  Hyperlipidemia: LDL 148 on 06/13/2019, improved to LDL 44 08/15/19 while on Lipitor 80 mg daily, but was discontinued due to transaminitis.  She also reported having myalgias with atorvastatin.  Referred to lipid clinic, started on rosuvastatin 5 mg every other day but developed myalgias.  Was reduced to twice weekly but continued to have myalgias so was discontinued.  Started on Praluent September 2021.  LDL 80 on 06/15/2020, Praluent dose increased to 150 mg every 14 days.  Will recheck lipid panel  Hypertension: On carvedilol 6.25 mg twice daily, amlodipine 10 mg daily, and hydrochlorothiazide 25 mg daily.  Appears controlled.  Check BMP  Stage IV lung cancer: Followed by oncology.  On Tagrisso  RTC in 6 months  Medication Adjustments/Labs and Tests Ordered: Current medicines are reviewed at length with the patient today.  Concerns regarding medicines are outlined above.  Orders Placed This Encounter  Procedures  . Basic metabolic panel  . Lipid  panel   No orders of the defined types were placed in this encounter.   Patient Instructions  Medication Instructions:  Your physician recommends that you continue on your current medications as directed. Please refer to the Current Medication list given to you today.  *If you need a refill on your cardiac medications before your next appointment, please call your pharmacy*   Lab Work: BMET, Lipid today  If you have labs (blood work) drawn today and your tests are completely normal, you will receive your results only by: Marland Kitchen MyChart Message (if you have MyChart) OR . A paper copy in the mail If you have any lab test that is abnormal or we need to change your treatment, we will call you to review the results.  Follow-Up: At [facility], you and your health needs are our priority.  As part of our continuing mission to provide you with exceptional heart care, we have created designated Provider Care Teams.  These Care Teams include your primary Cardiologist (physician) and Advanced Practice Providers (APPs -  Physician Assistants and Nurse Practitioners) who all work together to provide you with the care you need, when you need it.  We recommend signing up for the patient portal called "MyChart".  Sign up information is provided on this After Visit Summary.  MyChart is used to connect with patients for Virtual Visits (Telemedicine).  Patients are able to view lab/test results, encounter notes, upcoming appointments, etc.  Non-urgent messages can be sent to your provider as well.   To learn more about what you can do with MyChart, go to [email or web address].    Your next appointment:   6 month(s)  The format for your next appointment:   In Person  Provider:   Oswaldo Milian, MD       Signed, Donato Heinz, MD  09/13/2020 8:34 AM    [place]

## 2020-09-13 ENCOUNTER — Ambulatory Visit: Payer: Medicare HMO | Admitting: Cardiology

## 2020-09-13 ENCOUNTER — Encounter: Payer: Self-pay | Admitting: Internal Medicine

## 2020-09-13 ENCOUNTER — Other Ambulatory Visit: Payer: Self-pay

## 2020-09-13 ENCOUNTER — Encounter: Payer: Self-pay | Admitting: Cardiology

## 2020-09-13 VITALS — BP 122/70 | HR 60 | Ht 64.0 in | Wt 131.0 lb

## 2020-09-13 DIAGNOSIS — G720 Drug-induced myopathy: Secondary | ICD-10-CM

## 2020-09-13 DIAGNOSIS — I10 Essential (primary) hypertension: Secondary | ICD-10-CM

## 2020-09-13 DIAGNOSIS — Z01810 Encounter for preprocedural cardiovascular examination: Secondary | ICD-10-CM | POA: Diagnosis not present

## 2020-09-13 DIAGNOSIS — I2510 Atherosclerotic heart disease of native coronary artery without angina pectoris: Secondary | ICD-10-CM

## 2020-09-13 DIAGNOSIS — E785 Hyperlipidemia, unspecified: Secondary | ICD-10-CM

## 2020-09-13 DIAGNOSIS — T466X5A Adverse effect of antihyperlipidemic and antiarteriosclerotic drugs, initial encounter: Secondary | ICD-10-CM

## 2020-09-13 LAB — BASIC METABOLIC PANEL
BUN/Creatinine Ratio: 9 — ABNORMAL LOW (ref 12–28)
BUN: 10 mg/dL (ref 8–27)
CO2: 24 mmol/L (ref 20–29)
Calcium: 9.9 mg/dL (ref 8.7–10.3)
Chloride: 100 mmol/L (ref 96–106)
Creatinine, Ser: 1.09 mg/dL — ABNORMAL HIGH (ref 0.57–1.00)
Glucose: 94 mg/dL (ref 65–99)
Potassium: 4.2 mmol/L (ref 3.5–5.2)
Sodium: 140 mmol/L (ref 134–144)
eGFR: 55 mL/min/{1.73_m2} — ABNORMAL LOW (ref >59)

## 2020-09-13 LAB — LIPID PANEL
Chol/HDL Ratio: 2.5 ratio (ref 0.0–4.4)
Cholesterol, Total: 157 mg/dL (ref 100–199)
HDL: 64 mg/dL (ref >39)
LDL Chol Calc (NIH): 79 mg/dL (ref 0–99)
Triglycerides: 73 mg/dL (ref 0–149)
VLDL Cholesterol Cal: 14 mg/dL (ref 5–40)

## 2020-09-13 NOTE — Patient Instructions (Signed)
Medication Instructions:  Your physician recommends that you continue on your current medications as directed. Please refer to the Current Medication list given to you today.  *If you need a refill on your cardiac medications before your next appointment, please call your pharmacy*   Lab Work: BMET, Lipid today  If you have labs (blood work) drawn today and your tests are completely normal, you will receive your results only by: Marland Kitchen MyChart Message (if you have MyChart) OR . A paper copy in the mail If you have any lab test that is abnormal or we need to change your treatment, we will call you to review the results.  Follow-Up: At [facility], you and your health needs are our priority.  As part of our continuing mission to provide you with exceptional heart care, we have created designated Provider Care Teams.  These Care Teams include your primary Cardiologist (physician) and Advanced Practice Providers (APPs -  Physician Assistants and Nurse Practitioners) who all work together to provide you with the care you need, when you need it.  We recommend signing up for the patient portal called "MyChart".  Sign up information is provided on this After Visit Summary.  MyChart is used to connect with patients for Virtual Visits (Telemedicine).  Patients are able to view lab/test results, encounter notes, upcoming appointments, etc.  Non-urgent messages can be sent to your provider as well.   To learn more about what you can do with MyChart, go to [email or web address].    Your next appointment:   6 month(s)  The format for your next appointment:   In Person  Provider:   Oswaldo Milian, MD

## 2020-09-14 ENCOUNTER — Telehealth: Payer: Self-pay | Admitting: Pharmacist

## 2020-09-14 ENCOUNTER — Other Ambulatory Visit: Payer: Self-pay | Admitting: Medical Oncology

## 2020-09-14 DIAGNOSIS — C3491 Malignant neoplasm of unspecified part of right bronchus or lung: Secondary | ICD-10-CM

## 2020-09-14 DIAGNOSIS — E785 Hyperlipidemia, unspecified: Secondary | ICD-10-CM

## 2020-09-14 MED ORDER — OSIMERTINIB MESYLATE 80 MG PO TABS: 80.0000 mg | ORAL_TABLET | Freq: Every day | ORAL | 3 refills | Status: DC

## 2020-09-14 NOTE — Telephone Encounter (Signed)
LDL 79. No improvement in LDL after increasing Praluent to 150mg . We have only seen about bout a 30% reduction since adding Praluent which is less than expected. Could try switching to Repatha (we do sometimes see pt respond to one and not the other), but it is non-formulary. If approved, cost would be higher. We do have the option of a [employer] grant currently however.  I have called and LVM for pt to call back

## 2020-09-20 ENCOUNTER — Telehealth: Payer: Self-pay

## 2020-09-20 MED ORDER — REPATHA SURECLICK 140 MG/ML ~~LOC~~ SOAJ
1.0000 | SUBCUTANEOUS | 11 refills | Status: DC
Start: 2020-09-20 — End: 2021-02-10

## 2020-09-20 NOTE — Telephone Encounter (Signed)
   Patient Name: Allison [name]  DOB: [birth year]  MRN: [id number]   Primary Cardiologist: Donato Heinz, MD  Chart reviewed as part of pre-operative protocol coverage. Patient has colonoscopy planned for 10/06/2020. Patient was recently seen by Dr. Gardiner Rhyme on 09/13/2020 at which time pre-op risk assessment was addressed. Per Dr. Newman Nickels note: "Denies any chest pain or dyspnea. Low risk procedure, no further cardiac work-up recommended prior to procedure. Recommend continuing Aspirin periprocedurally given her history of cardiac stenting."  I will route this recommendation to the requesting party via [facility] fax function and remove from pre-op pool.  Please call with questions.  Darreld Mclean, PA-C 09/20/2020, 12:20 PM

## 2020-09-20 NOTE — Telephone Encounter (Signed)
   [facility] Pre-operative Risk Assessment    Patient Name: Allison [name]  DOB: 11-Jun-1951  MRN: [id number]   HEARTCARE STAFF: - Please ensure there is not already an duplicate clearance open for this procedure. - Under Visit Info/Reason for Call, type in Other and utilize the format Clearance MM/DD/YY or Clearance TBD. Do not use dashes or single digits. - If request is for dental extraction, please clarify the # of teeth to be extracted.  Request for surgical clearance:  1. What type of surgery is being performed? Colonoscopy    2. When is this surgery scheduled? 10/06/20   3. What type of clearance is required (medical clearance vs. Pharmacy clearance to hold med vs. Both)? BOTH  4. Are there any medications that need to be held prior to surgery and how long? ASA   5. Practice name and name of physician performing surgery? [facility], [place], Dr. Collene Mares   6. What is the office phone number? [phone]   7.   What is the office fax number? [phone]  8.   Anesthesia type (None, local, MAC, general) ? Propofol   Jacqulynn Cadet 09/20/2020, 11:49 AM  _________________________________________________________________   (provider comments below)

## 2020-09-20 NOTE — Telephone Encounter (Signed)
Repatha prior authorization approved through 06/18/21. Rx sent to pharmacy. Called pt and enrolled her in [employer]. This will make her ezetimibe free as well - pt was very grateful as this med was expensive for her too. Grant info called to pharmacy, follow up labs scheduled after 3 Repatha injections to assess efficacy. If LDL doesn't respond better to Repatha than Praluent, will plan to change ezetimibe to Nexlizet.

## 2020-09-20 NOTE — Telephone Encounter (Signed)
Spoke with pt, she is willing to try changing from Praluent to [place] to see if her LDL drops more. Prior authorization submitted. Will also plan to apply for [employer] grant once PA approved - income is 50,000, household of 1.  If LDL does not drop as expected on Repatha, pt is agreeable to change ezetimibe to Nexlizet (see MyChart message for initial discussion regarding med change options).

## 2020-10-04 NOTE — Telephone Encounter (Signed)
I don't think of the amlodipine because it's not bilateral.  Only thought, if she didn't twist it or step wrong, is that it could be gout in the ankle.

## 2020-10-05 NOTE — Telephone Encounter (Signed)
Agree with plan to see PCP

## 2020-10-07 DIAGNOSIS — I10 Essential (primary) hypertension: Secondary | ICD-10-CM | POA: Diagnosis not present

## 2020-10-07 DIAGNOSIS — M79671 Pain in right foot: Secondary | ICD-10-CM | POA: Diagnosis not present

## 2020-10-07 DIAGNOSIS — C3491 Malignant neoplasm of unspecified part of right bronchus or lung: Secondary | ICD-10-CM | POA: Diagnosis not present

## 2020-10-11 ENCOUNTER — Other Ambulatory Visit: Payer: Self-pay

## 2020-10-11 ENCOUNTER — Ambulatory Visit (HOSPITAL_COMMUNITY)
Admission: RE | Admit: 2020-10-11 | Discharge: 2020-10-11 | Disposition: A | Discharge status: Home / Self Care | Payer: Medicare HMO | Source: Ambulatory Visit | Attending: Internal Medicine | Admitting: Internal Medicine

## 2020-10-11 ENCOUNTER — Inpatient Hospital Stay: Payer: Medicare HMO | Attending: Internal Medicine

## 2020-10-11 DIAGNOSIS — J910 Malignant pleural effusion: Secondary | ICD-10-CM | POA: Diagnosis not present

## 2020-10-11 DIAGNOSIS — Z79899 Other long term (current) drug therapy: Secondary | ICD-10-CM | POA: Diagnosis not present

## 2020-10-11 DIAGNOSIS — J8410 Pulmonary fibrosis, unspecified: Secondary | ICD-10-CM | POA: Diagnosis not present

## 2020-10-11 DIAGNOSIS — I700 Atherosclerosis of aorta: Secondary | ICD-10-CM | POA: Diagnosis not present

## 2020-10-11 DIAGNOSIS — M722 Plantar fascial fibromatosis: Secondary | ICD-10-CM | POA: Insufficient documentation

## 2020-10-11 DIAGNOSIS — C3411 Malignant neoplasm of upper lobe, right bronchus or lung: Secondary | ICD-10-CM | POA: Diagnosis not present

## 2020-10-11 DIAGNOSIS — C3490 Malignant neoplasm of unspecified part of unspecified bronchus or lung: Secondary | ICD-10-CM | POA: Diagnosis not present

## 2020-10-11 DIAGNOSIS — Z7982 Long term (current) use of aspirin: Secondary | ICD-10-CM | POA: Insufficient documentation

## 2020-10-11 DIAGNOSIS — F32A Depression, unspecified: Secondary | ICD-10-CM | POA: Insufficient documentation

## 2020-10-11 DIAGNOSIS — C799 Secondary malignant neoplasm of unspecified site: Secondary | ICD-10-CM | POA: Diagnosis not present

## 2020-10-11 DIAGNOSIS — K219 Gastro-esophageal reflux disease without esophagitis: Secondary | ICD-10-CM | POA: Diagnosis not present

## 2020-10-11 DIAGNOSIS — R69 Illness, unspecified: Secondary | ICD-10-CM | POA: Diagnosis not present

## 2020-10-11 DIAGNOSIS — I10 Essential (primary) hypertension: Secondary | ICD-10-CM | POA: Insufficient documentation

## 2020-10-11 DIAGNOSIS — I2510 Atherosclerotic heart disease of native coronary artery without angina pectoris: Secondary | ICD-10-CM | POA: Diagnosis not present

## 2020-10-11 DIAGNOSIS — C7800 Secondary malignant neoplasm of unspecified lung: Secondary | ICD-10-CM | POA: Insufficient documentation

## 2020-10-11 DIAGNOSIS — I7300 Raynaud's syndrome without gangrene: Secondary | ICD-10-CM | POA: Insufficient documentation

## 2020-10-11 LAB — CBC WITH DIFFERENTIAL (CANCER CENTER ONLY)
Abs Immature Granulocytes: 0.01 10*3/uL (ref 0.00–0.07)
Basophils Absolute: 0 10*3/uL (ref 0.0–0.1)
Basophils Relative: 1 %
Eosinophils Absolute: 0.1 10*3/uL (ref 0.0–0.5)
Eosinophils Relative: 4 %
HCT: 36.9 % (ref 36.0–46.0)
Hemoglobin: 12 g/dL (ref 12.0–15.0)
Immature Granulocytes: 0 %
Lymphocytes Relative: 40 %
Lymphs Abs: 1.6 10*3/uL (ref 0.7–4.0)
MCH: 26.3 pg (ref 26.0–34.0)
MCHC: 32.5 g/dL (ref 30.0–36.0)
MCV: 80.9 fL (ref 80.0–100.0)
Monocytes Absolute: 0.4 10*3/uL (ref 0.1–1.0)
Monocytes Relative: 11 %
Neutro Abs: 1.7 10*3/uL (ref 1.7–7.7)
Neutrophils Relative %: 44 %
Platelet Count: 185 10*3/uL (ref 150–400)
RBC: 4.56 MIL/uL (ref 3.87–5.11)
RDW: 14.3 % (ref 11.5–15.5)
WBC Count: 4 10*3/uL (ref 4.0–10.5)
nRBC: 0 % (ref 0.0–0.2)

## 2020-10-11 LAB — CMP (CANCER CENTER ONLY)
ALT: 27 U/L (ref 0–44)
AST: 30 U/L (ref 15–41)
Albumin: 4 g/dL (ref 3.5–5.0)
Alkaline Phosphatase: 60 U/L (ref 38–126)
Anion gap: 11 (ref 5–15)
BUN: 14 mg/dL (ref 8–23)
CO2: 28 mmol/L (ref 22–32)
Calcium: 9.6 mg/dL (ref 8.9–10.3)
Chloride: 102 mmol/L (ref 98–111)
Creatinine: 1.03 mg/dL — ABNORMAL HIGH (ref 0.44–1.00)
GFR, Estimated: 59 mL/min — ABNORMAL LOW (ref >60)
Glucose, Bld: 97 mg/dL (ref 70–99)
Potassium: 3.3 mmol/L — ABNORMAL LOW (ref 3.5–5.1)
Sodium: 141 mmol/L (ref 135–145)
Total Bilirubin: 0.5 mg/dL (ref 0.3–1.2)
Total Protein: 7.7 g/dL (ref 6.5–8.1)

## 2020-10-11 MED ORDER — IOHEXOL 300 MG/ML  SOLN
100.0000 mL | Freq: Once | INTRAMUSCULAR | Status: AC | PRN
  Administered 2020-10-11: 100 mL via INTRAVENOUS

## 2020-10-12 ENCOUNTER — Inpatient Hospital Stay: Payer: Medicare HMO | Admitting: Internal Medicine

## 2020-10-13 ENCOUNTER — Other Ambulatory Visit: Payer: Self-pay

## 2020-10-13 ENCOUNTER — Inpatient Hospital Stay: Payer: Medicare HMO

## 2020-10-13 ENCOUNTER — Encounter: Payer: Self-pay | Admitting: *Deleted

## 2020-10-13 ENCOUNTER — Inpatient Hospital Stay: Payer: Medicare HMO | Admitting: Internal Medicine

## 2020-10-13 VITALS — BP 145/70 | HR 71 | Temp 97.6°F | Resp 19 | Ht 64.0 in | Wt 132.4 lb

## 2020-10-13 DIAGNOSIS — C3491 Malignant neoplasm of unspecified part of right bronchus or lung: Secondary | ICD-10-CM | POA: Diagnosis not present

## 2020-10-13 DIAGNOSIS — K219 Gastro-esophageal reflux disease without esophagitis: Secondary | ICD-10-CM | POA: Diagnosis not present

## 2020-10-13 DIAGNOSIS — C3411 Malignant neoplasm of upper lobe, right bronchus or lung: Secondary | ICD-10-CM | POA: Diagnosis not present

## 2020-10-13 DIAGNOSIS — Z5111 Encounter for antineoplastic chemotherapy: Secondary | ICD-10-CM | POA: Diagnosis not present

## 2020-10-13 DIAGNOSIS — M722 Plantar fascial fibromatosis: Secondary | ICD-10-CM | POA: Diagnosis not present

## 2020-10-13 DIAGNOSIS — I7300 Raynaud's syndrome without gangrene: Secondary | ICD-10-CM | POA: Diagnosis not present

## 2020-10-13 DIAGNOSIS — J910 Malignant pleural effusion: Secondary | ICD-10-CM | POA: Diagnosis not present

## 2020-10-13 DIAGNOSIS — Z79899 Other long term (current) drug therapy: Secondary | ICD-10-CM | POA: Diagnosis not present

## 2020-10-13 DIAGNOSIS — I10 Essential (primary) hypertension: Secondary | ICD-10-CM | POA: Diagnosis not present

## 2020-10-13 DIAGNOSIS — C7800 Secondary malignant neoplasm of unspecified lung: Secondary | ICD-10-CM | POA: Diagnosis not present

## 2020-10-13 DIAGNOSIS — Z7982 Long term (current) use of aspirin: Secondary | ICD-10-CM | POA: Diagnosis not present

## 2020-10-13 DIAGNOSIS — R69 Illness, unspecified: Secondary | ICD-10-CM | POA: Diagnosis not present

## 2020-10-13 NOTE — Progress Notes (Signed)
Per Dr. Julien Nordmann, patient needs lab work, paper work completed.

## 2020-10-13 NOTE — Progress Notes (Signed)
[facility] [phone]   [phone]  OFFICE PROGRESS NOTE  [name], [place] [address]  DIAGNOSIS: Stage IV (T2 a,N2, M1a) non-small cell lung cancer, adenocarcinoma diagnosed in July 2019 and presented with right upper lobe lung mass in addition to mediastinal lymphadenopathy as well as bilateral pulmonary nodules and malignant right pleural effusion.  Biomarker Findings Microsatellite status - Cannot Be Determined Tumor Mutational Burden - Cannot Be Determined Genomic Findings For a complete list of the genes assayed, please refer to the Appendix. EGFR exon 19 deletion ([id number]) TP53 Y220C 7 Disease relevant genes with no reportable alterations: KRAS, ALK, BRAF, MET, RET, ERBB2, ROS1   PRIOR THERAPY: Status post right Pleurx catheter placement by Dr. Prescott Gum for drainage of malignant right pleural effusion.  CURRENT THERAPY: Tagrisso 80 mg p.o. daily.  First dose was given on 01/29/2018.  Status post 29 months of treatment.  INTERVAL HISTORY: Allison [name] 70 y.o. female returns to the clinic today for follow-up visit accompanied by her boyfriend.  The patient is feeling fine today with no concerning complaints except for suspicious left foot fasciitis.  The patient denied having any current chest pain, shortness of breath, cough or hemoptysis.  She denied having any fever or chills.  She has no nausea, vomiting, diarrhea or constipation.  She has no headache or visual changes.  She continues to tolerate her treatment with Tagrisso fairly well.  She had repeat CT scan of the chest, abdomen pelvis performed recently and she is here for evaluation and discussion of her scan results.  MEDICAL HISTORY: Past Medical History:  Diagnosis Date  . Anxiety   . Arthritis   . Asthma    exercise induced  . Depression    PMH  . GERD (gastroesophageal reflux disease)   . Glaucoma   . Hypertension    . lung ca dx'd 11/2017   right  . Malignant pleural effusion    right  . PONV (postoperative nausea and vomiting)   . Pre-diabetes   . Raynaud's disease   . Raynaud's disease     ALLERGIES:  is allergic to penicillins, vicodin [hydrocodone-acetaminophen], and other.  MEDICATIONS:  Current Outpatient Medications  Medication Sig Dispense Refill  . acetaminophen (TYLENOL) 500 MG tablet Take 1,000 mg by mouth every 6 (six) hours as needed (pain).     . Acetylcysteine (N-ACETYL-L-CYSTEINE PO) Take 1 tablet by mouth 2 (two) times daily.    Marland Kitchen ALPRAZolam (XANAX) 0.25 MG tablet Take 0.25 mg by mouth 2 (two) times daily as needed for anxiety.     Marland Kitchen amLODipine (NORVASC) 10 MG tablet Take 1 tablet (10 mg total) by mouth daily. 90 tablet 3  . aspirin 81 MG chewable tablet Chew 1 tablet (81 mg total) by mouth daily.    Marland Kitchen b complex vitamins capsule Take 1 capsule by mouth daily.    . Biotin 1000 MCG CHEW Chew 10 mg by mouth daily.    . carvedilol (COREG) 6.25 MG tablet Take 1 tablet (6.25 mg total) by mouth 2 (two) times daily with a meal. Please schedule appointment for future refills. Thank you 180 tablet 1  . dorzolamide-timolol (COSOPT) 22.3-6.8 MG/ML ophthalmic solution Instill 1 drop in both eyes twice daily    . estradiol (ESTRACE) 0.1 MG/GM vaginal cream Place 1 Applicatorful vaginally 2 (two) times a week.    . Evolocumab (REPATHA SURECLICK) 979 MG/ML SOAJ Inject 1 pen into  the skin every 14 (fourteen) days. 2 mL 11  . ezetimibe (ZETIA) 10 MG tablet Take 10 mg by mouth daily.     . hydrochlorothiazide (HYDRODIURIL) 25 MG tablet Take 25 mg by mouth daily. Currently taking 2 of the 12.16m daily    . latanoprost (XALATAN) 0.005 % ophthalmic solution Place 1 drop into both eyes at bedtime.     .Marland Kitchenosimertinib mesylate (TAGRISSO) 80 MG tablet Take 1 tablet (80 mg total) by mouth daily. 30 tablet 3  . Probiotic Product (PROBIOTIC-10 PO) Take by mouth.    . RESTASIS 0.05 % ophthalmic emulsion 1 drop  2 (two) times daily.    .Marland Kitchentestosterone cypionate (DEPOTESTOSTERONE CYPIONATE) 200 MG/ML injection Depo-Testosterone 200 mg/mL intramuscular oil  Given:    GM    . VITAMIN D-VITAMIN K PO Take 1 tablet by mouth daily.     No current facility-administered medications for this visit.    SURGICAL HISTORY:  Past Surgical History:  Procedure Laterality Date  . ABDOMINAL HYSTERECTOMY     partial  . CHEST TUBE INSERTION Right 01/01/2018   Procedure: INSERTION PLEURAL DRAINAGE CATHETER;  Surgeon: VIvin Poot MD;  [facility]  Service: Thoracic;  Laterality: Right;  . COLONOSCOPY    . CORONARY STENT INTERVENTION N/A 06/16/2019   Procedure: CORONARY STENT INTERVENTION;  Surgeon: VJettie Booze MD;  [facility];  Service: Cardiovascular;  Laterality: N/A;  . DILATION AND CURETTAGE OF UTERUS    . EYE SURGERY     due to Glaucoma  . LEFT HEART CATH AND CORONARY ANGIOGRAPHY N/A 06/16/2019   Procedure: LEFT HEART CATH AND CORONARY ANGIOGRAPHY;  Surgeon: VJettie Booze MD;  [facility];  Service: Cardiovascular;  Laterality: N/A;  . REMOVAL OF PLEURAL DRAINAGE CATHETER Right 11/07/2018   Procedure: REMOVAL OF PLEURAL DRAINAGE CATHETER;  Surgeon: VIvin Poot MD;  [facility]  Service: Thoracic;  Laterality: Right;  . ROTATOR CUFF REPAIR    . TUBAL LIGATION    . WISDOM TOOTH EXTRACTION      REVIEW OF SYSTEMS:  Constitutional: positive for fatigue Eyes: negative Ears, nose, mouth, throat, and face: negative Respiratory: negative Cardiovascular: negative Gastrointestinal: positive for diarrhea Genitourinary:negative Integument/breast: negative Hematologic/lymphatic: negative Musculoskeletal:positive for arthralgias Neurological: negative Behavioral/Psych: negative Endocrine: negative Allergic/Immunologic: negative   PHYSICAL EXAMINATION: General appearance: alert, cooperative and no distress Head: Normocephalic, without  obvious abnormality, atraumatic Neck: no adenopathy, no JVD, supple, symmetrical, trachea midline and thyroid not enlarged, symmetric, no tenderness/mass/nodules Lymph nodes: Cervical, supraclavicular, and axillary nodes normal. Resp: clear to auscultation bilaterally Back: symmetric, no curvature. ROM normal. No CVA tenderness. Cardio: regular rate and rhythm, S1, S2 normal, no murmur, click, rub or gallop GI: soft, non-tender; bowel sounds normal; no masses,  no organomegaly Extremities: extremities normal, atraumatic, no cyanosis or edema Neurologic: Alert and oriented X 3, normal strength and tone. Normal symmetric reflexes. Normal coordination and gait  ECOG PERFORMANCE STATUS: 1 - Symptomatic but completely ambulatory  Blood pressure (!) 145/70, pulse 71, temperature 97.6 F (36.4 C), temperature source Tympanic, resp. rate 19, height 5' 4" (1.626 m), weight 132 lb 6.4 oz (60.1 kg), SpO2 95 %.  LABORATORY DATA: Lab Results  Component Value Date   WBC 4.0 10/11/2020   HGB 12.0 10/11/2020   HCT 36.9 10/11/2020   MCV 80.9 10/11/2020   PLT 185 10/11/2020      Chemistry      Component Value Date/Time   NA  141 10/11/2020 0736   NA 140 09/13/2020 0847   K 3.3 (L) 10/11/2020 0736   CL 102 10/11/2020 0736   CO2 28 10/11/2020 0736   BUN 14 10/11/2020 0736   BUN 10 09/13/2020 0847   CREATININE 1.03 (H) 10/11/2020 0736      Component Value Date/Time   CALCIUM 9.6 10/11/2020 0736   ALKPHOS 60 10/11/2020 0736   AST 30 10/11/2020 0736   ALT 27 10/11/2020 0736   BILITOT 0.5 10/11/2020 0736       RADIOGRAPHIC STUDIES: CT Chest W Contrast  Result Date: 10/11/2020 CLINICAL DATA:  Non-small-cell lung cancer.  Restaging. EXAM: CT CHEST, ABDOMEN, AND PELVIS WITH CONTRAST TECHNIQUE: Multidetector CT imaging of the chest, abdomen and pelvis was performed following the standard protocol during bolus administration of intravenous contrast. CONTRAST:  127m OMNIPAQUE IOHEXOL 300 MG/ML   SOLN COMPARISON:  05/17/2020 FINDINGS: CT CHEST FINDINGS Cardiovascular: The heart size is normal. No substantial pericardial effusion. Atherosclerotic calcification is noted in the wall of the thoracic aorta. Mediastinum/Nodes: Interval progression of mediastinal lymphadenopathy. 9 mm short axis high right paratracheal node on image 9/series 2 was about 4 mm previously (remeasured). 11 mm short axis pretracheal node on 14/2 was 6 mm previously. 9 mm short axis AP window lymph node on 19/2 was 4 mm previously. Similar progression of a 13 mm subcarinal lymph node. There is no hilar lymphadenopathy. The esophagus has normal imaging features. Tiny bilateral thyroid nodules are stable. In the setting of significant comorbidities or limited life expectancy, no follow-up recommended (ref: J Am Coll Radiol. 2015 Feb;12(2): 143-50). There is no axillary lymphadenopathy. Lungs/Pleura: Irregular right apical mass is progressive in the interval measuring 3.8 x 3.0 cm today compared to 2.6 x 1.8 cm previously (remeasured). 8 mm right lower lobe nodule on 88/6 is stable. Stable left lower lobe granuloma on 89/6. No new suspicious pulmonary nodule or mass. No focal airspace consolidation. No pleural effusion. Musculoskeletal: No worrisome lytic or sclerotic osseous abnormality. CT ABDOMEN PELVIS FINDINGS Hepatobiliary: No suspicious focal abnormality within the liver parenchyma. There is no evidence for gallstones, gallbladder wall thickening, or pericholecystic fluid. No intrahepatic or extrahepatic biliary dilation. Pancreas: No focal mass lesion. No dilatation of the main duct. No intraparenchymal cyst. No peripancreatic edema. Spleen: No splenomegaly. No focal mass lesion. Adrenals/Urinary Tract: No adrenal nodule or mass. Kidneys unremarkable. No evidence for hydroureter. The urinary bladder appears normal for the degree of distention. Stomach/Bowel: Stomach is unremarkable. No gastric wall thickening. No evidence of outlet  obstruction. Duodenum is normally positioned as is the ligament of Treitz. No small bowel wall thickening. No small bowel dilatation. The terminal ileum is normal. The appendix is not well visualized, but there is no edema or inflammation in the region of the cecum. No gross colonic mass. No colonic wall thickening. Vascular/Lymphatic: There is abdominal aortic atherosclerosis without aneurysm. There is no gastrohepatic or hepatoduodenal ligament lymphadenopathy. No retroperitoneal or mesenteric lymphadenopathy. No pelvic sidewall lymphadenopathy. Reproductive: The uterus is surgically absent. There is no adnexal mass. Other: No intraperitoneal free fluid. Musculoskeletal: No worrisome lytic or sclerotic osseous abnormality. IMPRESSION: 1. Interval progression of the right apical mass with interval progression of mediastinal lymphadenopathy. Imaging features consistent for recurrent/metastatic disease. PET-CT may prove helpful to further evaluate. 2. Stable 8 mm right lower lobe pulmonary nodule. 3. No evidence for metastatic disease in the abdomen or pelvis. 4. Aortic Atherosclerosis (ICD10-I70.0). Electronically Signed   By: EMisty StanleyM.D.   On: 10/11/2020 11:49  CT Abdomen Pelvis W Contrast  Result Date: 10/11/2020 CLINICAL DATA:  Non-small-cell lung cancer.  Restaging. EXAM: CT CHEST, ABDOMEN, AND PELVIS WITH CONTRAST TECHNIQUE: Multidetector CT imaging of the chest, abdomen and pelvis was performed following the standard protocol during bolus administration of intravenous contrast. CONTRAST:  153m OMNIPAQUE IOHEXOL 300 MG/ML  SOLN COMPARISON:  05/17/2020 FINDINGS: CT CHEST FINDINGS Cardiovascular: The heart size is normal. No substantial pericardial effusion. Atherosclerotic calcification is noted in the wall of the thoracic aorta. Mediastinum/Nodes: Interval progression of mediastinal lymphadenopathy. 9 mm short axis high right paratracheal node on image 9/series 2 was about 4 mm previously  (remeasured). 11 mm short axis pretracheal node on 14/2 was 6 mm previously. 9 mm short axis AP window lymph node on 19/2 was 4 mm previously. Similar progression of a 13 mm subcarinal lymph node. There is no hilar lymphadenopathy. The esophagus has normal imaging features. Tiny bilateral thyroid nodules are stable. In the setting of significant comorbidities or limited life expectancy, no follow-up recommended (ref: J Am Coll Radiol. 2015 Feb;12(2): 143-50). There is no axillary lymphadenopathy. Lungs/Pleura: Irregular right apical mass is progressive in the interval measuring 3.8 x 3.0 cm today compared to 2.6 x 1.8 cm previously (remeasured). 8 mm right lower lobe nodule on 88/6 is stable. Stable left lower lobe granuloma on 89/6. No new suspicious pulmonary nodule or mass. No focal airspace consolidation. No pleural effusion. Musculoskeletal: No worrisome lytic or sclerotic osseous abnormality. CT ABDOMEN PELVIS FINDINGS Hepatobiliary: No suspicious focal abnormality within the liver parenchyma. There is no evidence for gallstones, gallbladder wall thickening, or pericholecystic fluid. No intrahepatic or extrahepatic biliary dilation. Pancreas: No focal mass lesion. No dilatation of the main duct. No intraparenchymal cyst. No peripancreatic edema. Spleen: No splenomegaly. No focal mass lesion. Adrenals/Urinary Tract: No adrenal nodule or mass. Kidneys unremarkable. No evidence for hydroureter. The urinary bladder appears normal for the degree of distention. Stomach/Bowel: Stomach is unremarkable. No gastric wall thickening. No evidence of outlet obstruction. Duodenum is normally positioned as is the ligament of Treitz. No small bowel wall thickening. No small bowel dilatation. The terminal ileum is normal. The appendix is not well visualized, but there is no edema or inflammation in the region of the cecum. No gross colonic mass. No colonic wall thickening. Vascular/Lymphatic: There is abdominal aortic  atherosclerosis without aneurysm. There is no gastrohepatic or hepatoduodenal ligament lymphadenopathy. No retroperitoneal or mesenteric lymphadenopathy. No pelvic sidewall lymphadenopathy. Reproductive: The uterus is surgically absent. There is no adnexal mass. Other: No intraperitoneal free fluid. Musculoskeletal: No worrisome lytic or sclerotic osseous abnormality. IMPRESSION: 1. Interval progression of the right apical mass with interval progression of mediastinal lymphadenopathy. Imaging features consistent for recurrent/metastatic disease. PET-CT may prove helpful to further evaluate. 2. Stable 8 mm right lower lobe pulmonary nodule. 3. No evidence for metastatic disease in the abdomen or pelvis. 4. Aortic Atherosclerosis (ICD10-I70.0). Electronically Signed   By: EMisty StanleyM.D.   On: 10/11/2020 11:49    ASSESSMENT AND PLAN: This is a very pleasant 70years old never smoker African-American female recently with a stage IV non-small cell lung cancer, adenocarcinoma with positive EGFR mutation with deletion in exon 19. The patient was started on treatment with Tagrisso 80 mg p.o. daily status post 29 months of treatment. She has been tolerating this treatment well with no concerning adverse effects except for intermittent diarrhea. She had repeat CT scan of the chest, abdomen pelvis performed recently.  I personally and independently reviewed the scans and  discussed the results with the patient and her boyfriend today. Unfortunately the CT scan showed interval progression of the right apical lung mass in addition to progression of mediastinal lymphadenopathy concerning for worsening of her disease. I recommended for the patient to have repeat blood test to [facility] for molecular studies to identify any resistant mutations that could have been developed on treatment with Tagrisso. If the patient has no mutation, we may consider her for a course of radiotherapy to the enlarging right upper lobe  and mediastinal lymph nodes and she would continue her current oral treatment with Tagrisso. I will see her back for follow-up visit in 2 weeks for evaluation and more detailed discussion of her treatment options based on the molecular studies. The patient was advised to call immediately if she has any other concerning symptoms in the interval. The patient voices understanding of current disease status and treatment options and is in agreement with the current care plan. All questions were answered. The patient knows to call the clinic with any problems, questions or concerns. We can certainly see the patient much sooner if necessary.  Disclaimer: This note was dictated with voice recognition software. Similar sounding words can inadvertently be transcribed and may not be corrected upon review.

## 2020-10-14 ENCOUNTER — Encounter: Payer: Self-pay | Admitting: Internal Medicine

## 2020-10-14 ENCOUNTER — Telehealth: Payer: Self-pay | Admitting: Internal Medicine

## 2020-10-14 NOTE — Telephone Encounter (Signed)
Scheduled per los. Called and spoke with patient. Confirmed appt 

## 2020-10-19 DIAGNOSIS — C3491 Malignant neoplasm of unspecified part of right bronchus or lung: Secondary | ICD-10-CM | POA: Diagnosis not present

## 2020-10-21 ENCOUNTER — Encounter: Payer: Self-pay | Admitting: Internal Medicine

## 2020-10-21 LAB — GUARDANT 360

## 2020-10-23 NOTE — Progress Notes (Signed)
[facility] OFFICE PROGRESS NOTE  [name], [place] [address]  DIAGNOSIS: Stage IV (T2 a,N2, M1a)non-small cell lung cancer, adenocarcinoma diagnosed in July 2019 and presented with right upper lobe lung mass in addition to mediastinal lymphadenopathy as well as bilateral pulmonary nodules and malignant right pleural effusion.  Biomarker Findings Microsatellite status - Cannot Be Determined Tumor Mutational Burden - Cannot Be Determined Genomic Findings For a complete list of the genes assayed, please refer to the Appendix. EGFR exon 19 deletion ([id number]) TP53 Y220C 7 Disease relevant genes with no reportable alterations: KRAS, ALK, BRAF, MET, RET, ERBB2, ROS1   PRIOR THERAPY: Status post right Pleurx catheter placement by Dr. Prescott Gum for drainage of malignant right pleural effusion.  CURRENT THERAPY:  Tagrisso 80 mg p.o. daily.  First dose was given on 01/29/2018.  Status post 33 months of treatment.  INTERVAL HISTORY: Allison [name] 70 y.o. female returns to the clinic today for a follow up visit accompanied by her daughter. The patient was last seen on 10/13/20. At that visit, she recently had a restaging CT scan of the chest, abdomen, and pelvis performed which showed interval progression of the right apical lung mass in addition to progression of mediastinal lymphadenopathy concerning for worsening of her disease. Dr. Julien Nordmann recommended having repeat Guardant 360 testing for molecular studies to identify any resistant mutations that could have developled on her treatment with Tagrisso. She had repeat testing which did not show any evidence of new resistant mutations.   Otherwise, the patient feels well today except she has a few scattered pruritic skin lesions. She has one on her right abdomen and behind her left knee. She also has a few near her buttock. She woke up with these on 10/23/20. She has been applying clindamycin  cream which has been helping. She denies bed bugs. Denies bug bites although she likes being outside gardening. No drainage. Denies new lotions, soaps, or detergents. She started taking supplements that she received from her PCP about 2 weeks ago. The patient continues to tolerate treatment with oral chemotherapy with Tagrisso well without any adverse effects. Denies any fever, chills, or weight loss. She reports baseline night sweats. Denies any chest pain, shortness of breath, cough, or hemoptysis. She has mild chest tightness with exertion. She walked 1.5 miles yesterday. Denies any nausea, vomiting, diarrhea, or constipation. Denies any headache or visual changes.  The patient is here today for evaluation and to review the results of her molecular studies and for a more detailed discussion of her recommended treatment options.     MEDICAL HISTORY: Past Medical History:  Diagnosis Date  . Anxiety   . Arthritis   . Asthma    exercise induced  . Depression    PMH  . GERD (gastroesophageal reflux disease)   . Glaucoma   . Hypertension   . lung ca dx'd 11/2017   right  . Malignant pleural effusion    right  . PONV (postoperative nausea and vomiting)   . Pre-diabetes   . Raynaud's disease   . Raynaud's disease     ALLERGIES:  is allergic to penicillins, vicodin [hydrocodone-acetaminophen], and other.  MEDICATIONS:  Current Outpatient Medications  Medication Sig Dispense Refill  . acetaminophen (TYLENOL) 500 MG tablet Take 1,000 mg by mouth every 6 (six) hours as needed (pain).     . Acetylcysteine (N-ACETYL-L-CYSTEINE PO) Take 1 tablet by mouth once.    . ALPRAZolam (XANAX) 0.25 MG  tablet Take 0.25 mg by mouth 2 (two) times daily as needed for anxiety.     Marland Kitchen amLODipine (NORVASC) 10 MG tablet Take 1 tablet (10 mg total) by mouth daily. (Patient taking differently: Take 5 mg by mouth daily.) 90 tablet 3  . aspirin 81 MG chewable tablet Chew 1 tablet (81 mg total) by mouth daily.    Marland Kitchen b  complex vitamins capsule Take 1 capsule by mouth daily.    . Biotin 1000 MCG CHEW Chew 10 mg by mouth daily.    . carvedilol (COREG) 6.25 MG tablet Take 1 tablet (6.25 mg total) by mouth 2 (two) times daily with a meal. Please schedule appointment for future refills. Thank you 180 tablet 1  . dorzolamide-timolol (COSOPT) 22.3-6.8 MG/ML ophthalmic solution Instill 1 drop in both eyes twice daily    . estradiol (ESTRACE) 0.1 MG/GM vaginal cream Place 1 Applicatorful vaginally 2 (two) times a week.    . Evolocumab (REPATHA SURECLICK) 453 MG/ML SOAJ Inject 1 pen into the skin every 14 (fourteen) days. 2 mL 11  . ezetimibe (ZETIA) 10 MG tablet Take 10 mg by mouth daily.     . hydrochlorothiazide (HYDRODIURIL) 25 MG tablet Take 25 mg by mouth daily. Currently taking 2 of the 12.58m daily    . latanoprost (XALATAN) 0.005 % ophthalmic solution Place 1 drop into both eyes at bedtime.     .Marland Kitchenosimertinib mesylate (TAGRISSO) 80 MG tablet Take 1 tablet (80 mg total) by mouth daily. 30 tablet 3  . OVER THE COUNTER MEDICATION Collagen and Peptides    . Probiotic Product (PROBIOTIC-10 PO) Take by mouth.    . RESTASIS 0.05 % ophthalmic emulsion 1 drop 2 (two) times daily.    .Marland Kitchentestosterone cypionate (DEPOTESTOSTERONE CYPIONATE) 200 MG/ML injection Depo-Testosterone 200 mg/mL intramuscular oil  Given:    GM    . VITAMIN D-VITAMIN K PO Take 1 tablet by mouth daily.     No current facility-administered medications for this visit.    SURGICAL HISTORY:  Past Surgical History:  Procedure Laterality Date  . ABDOMINAL HYSTERECTOMY     partial  . CHEST TUBE INSERTION Right 01/01/2018   Procedure: INSERTION PLEURAL DRAINAGE CATHETER;  Surgeon: VIvin Poot MD;  Location[facility]  Service: Thoracic;  Laterality: Right;  . COLONOSCOPY    . CORONARY STENT INTERVENTION N/A 06/16/2019   Procedure: CORONARY STENT INTERVENTION;  Surgeon: VJettie Booze MD;  [facility];  Service: Cardiovascular;   Laterality: N/A;  . DILATION AND CURETTAGE OF UTERUS    . EYE SURGERY     due to Glaucoma  . LEFT HEART CATH AND CORONARY ANGIOGRAPHY N/A 06/16/2019   Procedure: LEFT HEART CATH AND CORONARY ANGIOGRAPHY;  Surgeon: VJettie Booze MD;  [facility];  Service: Cardiovascular;  Laterality: N/A;  . REMOVAL OF PLEURAL DRAINAGE CATHETER Right 11/07/2018   Procedure: REMOVAL OF PLEURAL DRAINAGE CATHETER;  Surgeon: VIvin Poot MD;  Location: [facility]  Service: Thoracic;  Laterality: Right;  . ROTATOR CUFF REPAIR    . TUBAL LIGATION    . WISDOM TOOTH EXTRACTION      REVIEW OF SYSTEMS:   Review of Systems  Constitutional: Negative for appetite change, chills, fatigue, fever and unexpected weight change.  HENT: Negative for mouth sores, nosebleeds, sore throat and trouble swallowing.   Eyes: Negative for eye problems and icterus.  Respiratory: Positive for mild chest tightness with exertion. Negative for cough, hemoptysis, shortness  of breath and wheezing.   Cardiovascular: Negative for chest pain and leg swelling.  Gastrointestinal: Negative for abdominal pain, constipation, diarrhea, nausea and vomiting.  Genitourinary: Negative for bladder incontinence, difficulty urinating, dysuria, frequency and hematuria.   Musculoskeletal: Negative for back pain, gait problem, neck pain and neck stiffness.  Skin: Positive for a few scattered skin lesions.  Neurological: Negative for dizziness, extremity weakness, gait problem, headaches, light-headedness and seizures.  Hematological: Negative for adenopathy. Does not bruise/bleed easily.  Psychiatric/Behavioral: Negative for confusion, depression and sleep disturbance. The patient is not nervous/anxious.     PHYSICAL EXAMINATION:  Blood pressure (!) 152/74, pulse (!) 58, temperature 98.2 F (36.8 C), temperature source Oral, resp. rate 18, weight 131 lb 4.8 oz (59.6 kg), SpO2 100 %.  ECOG PERFORMANCE STATUS: 1 - Symptomatic but  completely ambulatory  Physical Exam  Constitutional: Oriented to person, place, and time and well-developed, well-nourished, and in no distress.  HENT:  Head: Normocephalic and atraumatic.  Mouth/Throat: Oropharynx is clear and moist. No oropharyngeal exudate.  Eyes: Conjunctivae are normal. Right eye exhibits no discharge. Left eye exhibits no discharge. No scleral icterus.  Neck: Normal range of motion. Neck supple.  Cardiovascular: Normal rate, regular rhythm, normal heart sounds and intact distal pulses.   Pulmonary/Chest: Effort normal and breath sounds normal. No respiratory distress. No wheezes. No rales.  Abdominal: Soft. Bowel sounds are normal. Exhibits no distension and no mass. There is no tenderness.  Musculoskeletal: Normal range of motion. Exhibits no edema.  Lymphadenopathy:    No cervical adenopathy.  Neurological: Alert and oriented to person, place, and time. Exhibits normal muscle tone. Gait normal. Coordination normal.  Skin: A few scattered skin lesions. Not consistent with tagrisso rash. Appear most similar to bug bites. Skin is warm and dry. Not diaphoretic. No erythema. No pallor.  Psychiatric: Mood, memory and judgment normal.  Vitals reviewed.  LABORATORY DATA: Lab Results  Component Value Date   WBC 4.0 10/11/2020   HGB 12.0 10/11/2020   HCT 36.9 10/11/2020   MCV 80.9 10/11/2020   PLT 185 10/11/2020      Chemistry      Component Value Date/Time   NA 141 10/11/2020 0736   NA 140 09/13/2020 0847   K 3.3 (L) 10/11/2020 0736   CL 102 10/11/2020 0736   CO2 28 10/11/2020 0736   BUN 14 10/11/2020 0736   BUN 10 09/13/2020 0847   CREATININE 1.03 (H) 10/11/2020 0736      Component Value Date/Time   CALCIUM 9.6 10/11/2020 0736   ALKPHOS 60 10/11/2020 0736   AST 30 10/11/2020 0736   ALT 27 10/11/2020 0736   BILITOT 0.5 10/11/2020 0736       RADIOGRAPHIC STUDIES:  CT Chest W Contrast  Result Date: 10/11/2020 CLINICAL DATA:  Non-small-cell lung  cancer.  Restaging. EXAM: CT CHEST, ABDOMEN, AND PELVIS WITH CONTRAST TECHNIQUE: Multidetector CT imaging of the chest, abdomen and pelvis was performed following the standard protocol during bolus administration of intravenous contrast. CONTRAST:  159m OMNIPAQUE IOHEXOL 300 MG/ML  SOLN COMPARISON:  05/17/2020 FINDINGS: CT CHEST FINDINGS Cardiovascular: The heart size is normal. No substantial pericardial effusion. Atherosclerotic calcification is noted in the wall of the thoracic aorta. Mediastinum/Nodes: Interval progression of mediastinal lymphadenopathy. 9 mm short axis high right paratracheal node on image 9/series 2 was about 4 mm previously (remeasured). 11 mm short axis pretracheal node on 14/2 was 6 mm previously. 9 mm short axis AP window lymph node on 19/2 was 4  mm previously. Similar progression of a 13 mm subcarinal lymph node. There is no hilar lymphadenopathy. The esophagus has normal imaging features. Tiny bilateral thyroid nodules are stable. In the setting of significant comorbidities or limited life expectancy, no follow-up recommended (ref: J Am Coll Radiol. 2015 Feb;12(2): 143-50). There is no axillary lymphadenopathy. Lungs/Pleura: Irregular right apical mass is progressive in the interval measuring 3.8 x 3.0 cm today compared to 2.6 x 1.8 cm previously (remeasured). 8 mm right lower lobe nodule on 88/6 is stable. Stable left lower lobe granuloma on 89/6. No new suspicious pulmonary nodule or mass. No focal airspace consolidation. No pleural effusion. Musculoskeletal: No worrisome lytic or sclerotic osseous abnormality. CT ABDOMEN PELVIS FINDINGS Hepatobiliary: No suspicious focal abnormality within the liver parenchyma. There is no evidence for gallstones, gallbladder wall thickening, or pericholecystic fluid. No intrahepatic or extrahepatic biliary dilation. Pancreas: No focal mass lesion. No dilatation of the main duct. No intraparenchymal cyst. No peripancreatic edema. Spleen: No  splenomegaly. No focal mass lesion. Adrenals/Urinary Tract: No adrenal nodule or mass. Kidneys unremarkable. No evidence for hydroureter. The urinary bladder appears normal for the degree of distention. Stomach/Bowel: Stomach is unremarkable. No gastric wall thickening. No evidence of outlet obstruction. Duodenum is normally positioned as is the ligament of Treitz. No small bowel wall thickening. No small bowel dilatation. The terminal ileum is normal. The appendix is not well visualized, but there is no edema or inflammation in the region of the cecum. No gross colonic mass. No colonic wall thickening. Vascular/Lymphatic: There is abdominal aortic atherosclerosis without aneurysm. There is no gastrohepatic or hepatoduodenal ligament lymphadenopathy. No retroperitoneal or mesenteric lymphadenopathy. No pelvic sidewall lymphadenopathy. Reproductive: The uterus is surgically absent. There is no adnexal mass. Other: No intraperitoneal free fluid. Musculoskeletal: No worrisome lytic or sclerotic osseous abnormality. IMPRESSION: 1. Interval progression of the right apical mass with interval progression of mediastinal lymphadenopathy. Imaging features consistent for recurrent/metastatic disease. PET-CT may prove helpful to further evaluate. 2. Stable 8 mm right lower lobe pulmonary nodule. 3. No evidence for metastatic disease in the abdomen or pelvis. 4. Aortic Atherosclerosis (ICD10-I70.0). Electronically Signed   By: Misty Stanley M.D.   On: 10/11/2020 11:49   CT Abdomen Pelvis W Contrast  Result Date: 10/11/2020 CLINICAL DATA:  Non-small-cell lung cancer.  Restaging. EXAM: CT CHEST, ABDOMEN, AND PELVIS WITH CONTRAST TECHNIQUE: Multidetector CT imaging of the chest, abdomen and pelvis was performed following the standard protocol during bolus administration of intravenous contrast. CONTRAST:  151m OMNIPAQUE IOHEXOL 300 MG/ML  SOLN COMPARISON:  05/17/2020 FINDINGS: CT CHEST FINDINGS Cardiovascular: The heart size  is normal. No substantial pericardial effusion. Atherosclerotic calcification is noted in the wall of the thoracic aorta. Mediastinum/Nodes: Interval progression of mediastinal lymphadenopathy. 9 mm short axis high right paratracheal node on image 9/series 2 was about 4 mm previously (remeasured). 11 mm short axis pretracheal node on 14/2 was 6 mm previously. 9 mm short axis AP window lymph node on 19/2 was 4 mm previously. Similar progression of a 13 mm subcarinal lymph node. There is no hilar lymphadenopathy. The esophagus has normal imaging features. Tiny bilateral thyroid nodules are stable. In the setting of significant comorbidities or limited life expectancy, no follow-up recommended (ref: J Am Coll Radiol. 2015 Feb;12(2): 143-50). There is no axillary lymphadenopathy. Lungs/Pleura: Irregular right apical mass is progressive in the interval measuring 3.8 x 3.0 cm today compared to 2.6 x 1.8 cm previously (remeasured). 8 mm right lower lobe nodule on 88/6 is stable. Stable left  lower lobe granuloma on 89/6. No new suspicious pulmonary nodule or mass. No focal airspace consolidation. No pleural effusion. Musculoskeletal: No worrisome lytic or sclerotic osseous abnormality. CT ABDOMEN PELVIS FINDINGS Hepatobiliary: No suspicious focal abnormality within the liver parenchyma. There is no evidence for gallstones, gallbladder wall thickening, or pericholecystic fluid. No intrahepatic or extrahepatic biliary dilation. Pancreas: No focal mass lesion. No dilatation of the main duct. No intraparenchymal cyst. No peripancreatic edema. Spleen: No splenomegaly. No focal mass lesion. Adrenals/Urinary Tract: No adrenal nodule or mass. Kidneys unremarkable. No evidence for hydroureter. The urinary bladder appears normal for the degree of distention. Stomach/Bowel: Stomach is unremarkable. No gastric wall thickening. No evidence of outlet obstruction. Duodenum is normally positioned as is the ligament of Treitz. No small  bowel wall thickening. No small bowel dilatation. The terminal ileum is normal. The appendix is not well visualized, but there is no edema or inflammation in the region of the cecum. No gross colonic mass. No colonic wall thickening. Vascular/Lymphatic: There is abdominal aortic atherosclerosis without aneurysm. There is no gastrohepatic or hepatoduodenal ligament lymphadenopathy. No retroperitoneal or mesenteric lymphadenopathy. No pelvic sidewall lymphadenopathy. Reproductive: The uterus is surgically absent. There is no adnexal mass. Other: No intraperitoneal free fluid. Musculoskeletal: No worrisome lytic or sclerotic osseous abnormality. IMPRESSION: 1. Interval progression of the right apical mass with interval progression of mediastinal lymphadenopathy. Imaging features consistent for recurrent/metastatic disease. PET-CT may prove helpful to further evaluate. 2. Stable 8 mm right lower lobe pulmonary nodule. 3. No evidence for metastatic disease in the abdomen or pelvis. 4. Aortic Atherosclerosis (ICD10-I70.0). Electronically Signed   By: Misty Stanley M.D.   On: 10/11/2020 11:49     ASSESSMENT/PLAN:  This is a very pleasant 70 year old never smoker African-American female diagnosed with a stage IV non-small cell lung cancer, adenocarcinoma with a positive EGFR mutation with deletion in exon 19. She was diagnosed in July 2019 and presented with right upper lobe lung mass in addition to mediastinal lymphadenopathy as well as bilateral pulmonary nodules and malignant right pleural effusion.  The patient was started on treatment with Tagrisso 80 mg p.o. daily status post 33 months of treatment. She has been tolerating this treatment well with no concerning adverse effects except for intermittent diarrhea.   In April 2022, she showed evidence of disease progression with interval progression of the right apical lung mass in addition to progression of mediastinal lymphadenopathy concerning for worsening of  her disease.  She had repeat Guardant 360 molecular studies which did not show evidence of new resistant mutations.   The patient was seen with Dr. Julien Nordmann today. Dr. Julien Nordmann recommends referring the patient to radiation oncology to the enlarging right upper lobe lung mass and mediastinal lymph nodes and to current oral treatment with tagrisso. He also recommends obtaining a PET scan just to ensure that she has local disease progression and no evidence of metastatic disease. He also recommends obtaining a restaging brain MRI to rule out metastatic disease. He would like her to continue to take her tagrisso at th same dose for now.   We will see her back for a follow up visit in 8 weeks for evaluation and repeat blood work.   I will place the referral to radiation oncology.   Regarding the supplements, we discussed these are not well studied with Tagrisso which may increase the toxicies or weaken the effectiveness of the drugs. We advised her to discontinue her supplements.   For the scattered skin lesions, these  do not appear to be the typical rash associated with tagrisso. They also do not look like subcutaneous metastatic lesions to me but we are obtaining a restaging PET scan regardless. She will continue to use her clindamycin cream and I also discussed she can use hydrocortisone cream as well which she has at her house.   She has some post nasal drainage from allergies. Recommend using claritin, zyrtec, or allegra.   The patient was advised to call immediately if she has any concerning symptoms in the interval. The patient voices understanding of current disease status and treatment options and is in agreement with the current care plan. All questions were answered. The patient knows to call the clinic with any problems, questions or concerns. We can certainly see the patient much sooner if necessary    Orders Placed This Encounter  Procedures  . NM PET Image Restag (PS) Skull Base To  Thigh    Standing Status:   Future    Standing Expiration Date:   10/27/2021    Order Specific Question:   If indicated for the ordered procedure, I authorize the administration of a radiopharmaceutical per Radiology protocol    Answer:   Yes    Order Specific Question:   Preferred imaging location?    Answer:   [facility]  . MR Brain W Wo Contrast    Standing Status:   Future    Standing Expiration Date:   10/27/2021    Order Specific Question:   If indicated for the ordered procedure, I authorize the administration of contrast media per Radiology protocol    Answer:   Yes    Order Specific Question:   What is the patient's sedation requirement?    Answer:   No Sedation    Order Specific Question:   Does the patient have a pacemaker or implanted devices?    Answer:   No    Order Specific Question:   Use SRS Protocol?    Answer:   No    Order Specific Question:   Preferred imaging location?    Answer:   [facility] (table limit - 550 lbs)  . CBC with Differential (Cancer Center Only)    Standing Status:   Future    Standing Expiration Date:   10/27/2021  . CMP ([facility] only)    Standing Status:   Future    Standing Expiration Date:   10/27/2021  . Ambulatory referral to Radiation Oncology    Referral Priority:   Routine    Referral Type:   Consultation    Referral Reason:   Specialty Services Required    Requested Specialty:   Radiation Oncology    Number of Visits Requested:   [place], PA-C 10/27/20  ADDENDUM: Hematology/Oncology Attending: I had a face-to-face encounter with the patient today.  I reviewed her records and recommended her care plan.  This is a very pleasant 70 years old African-American female diagnosed with a stage IV non-small cell lung cancer, adenocarcinoma with positive EGFR mutation with deletion in exon 69 in July 2019.  The patient was started on treatment with Tagrisso 80 mg p.o. daily status post 33 months. Her  imaging studies last month showed evidence for disease progression with enlargement of the right upper lobe lung mass in addition to mediastinal lymphadenopathy. I repeated the molecular studies by Guardant 360 and it showed the persistent EGFR mutation with deletion of exon 19 but no new  resistant mutations. I had a lengthy discussion with the patient today about her current condition and treatment options. I recommended for the patient to continue her current treatment with Tagrisso with the same dose. I will order a PET scan for further evaluation of her condition and to confirm the disease progression and to rule out any other metastatic focus. After the PET scan showed no other metastatic disease besides the enlarging right upper lobe lung mass and the mediastinal lymphadenopathy, the patient may benefit from a course of radiotherapy to these areas but she will continue her current treatment with Tagrisso. If the PET scan showed widely metastatic disease, we may consider The patient for treatment with systemic chemotherapy. The patient will come back for follow-up visit in around 2 months for evaluation and repeat blood work. She was advised to call immediately if she has any concerning symptoms in the interval. The total time spent in the appointment was 30 minutes.  Disclaimer: This note was dictated with voice recognition software. Similar sounding words can inadvertently be transcribed and may be missed upon review. Eilleen Kempf, MD 10/27/20

## 2020-10-27 ENCOUNTER — Inpatient Hospital Stay: Payer: Medicare HMO

## 2020-10-27 ENCOUNTER — Inpatient Hospital Stay: Payer: Medicare HMO | Attending: Internal Medicine | Admitting: Physician Assistant

## 2020-10-27 ENCOUNTER — Other Ambulatory Visit: Payer: Self-pay

## 2020-10-27 VITALS — BP 152/74 | HR 58 | Temp 98.2°F | Resp 18 | Wt 131.3 lb

## 2020-10-27 DIAGNOSIS — C3491 Malignant neoplasm of unspecified part of right bronchus or lung: Secondary | ICD-10-CM | POA: Diagnosis not present

## 2020-10-27 DIAGNOSIS — K219 Gastro-esophageal reflux disease without esophagitis: Secondary | ICD-10-CM | POA: Insufficient documentation

## 2020-10-27 DIAGNOSIS — J910 Malignant pleural effusion: Secondary | ICD-10-CM | POA: Diagnosis not present

## 2020-10-27 DIAGNOSIS — Z79899 Other long term (current) drug therapy: Secondary | ICD-10-CM | POA: Insufficient documentation

## 2020-10-27 DIAGNOSIS — I7300 Raynaud's syndrome without gangrene: Secondary | ICD-10-CM | POA: Insufficient documentation

## 2020-10-27 DIAGNOSIS — C3411 Malignant neoplasm of upper lobe, right bronchus or lung: Secondary | ICD-10-CM | POA: Insufficient documentation

## 2020-10-27 DIAGNOSIS — I10 Essential (primary) hypertension: Secondary | ICD-10-CM | POA: Diagnosis not present

## 2020-10-28 ENCOUNTER — Telehealth: Payer: Self-pay | Admitting: Physician Assistant

## 2020-10-28 NOTE — Telephone Encounter (Signed)
Scheduled per los. Called and spoke with patient. Confirmed appt 

## 2020-11-01 ENCOUNTER — Other Ambulatory Visit: Payer: Self-pay

## 2020-11-01 ENCOUNTER — Other Ambulatory Visit: Payer: Medicare HMO | Admitting: *Deleted

## 2020-11-01 DIAGNOSIS — E785 Hyperlipidemia, unspecified: Secondary | ICD-10-CM

## 2020-11-01 LAB — HEPATIC FUNCTION PANEL
ALT: 37 IU/L — ABNORMAL HIGH (ref 0–32)
AST: 37 IU/L (ref 0–40)
Albumin: 4.6 g/dL (ref 3.8–4.8)
Alkaline Phosphatase: 65 IU/L (ref 44–121)
Bilirubin Total: 0.3 mg/dL (ref 0.0–1.2)
Bilirubin, Direct: 0.11 mg/dL (ref 0.00–0.40)
Total Protein: 7.1 g/dL (ref 6.0–8.5)

## 2020-11-01 LAB — LIPID PANEL
Chol/HDL Ratio: 2.4 ratio (ref 0.0–4.4)
Cholesterol, Total: 153 mg/dL (ref 100–199)
HDL: 64 mg/dL (ref >39)
LDL Chol Calc (NIH): 74 mg/dL (ref 0–99)
Triglycerides: 77 mg/dL (ref 0–149)
VLDL Cholesterol Cal: 15 mg/dL (ref 5–40)

## 2020-11-04 ENCOUNTER — Encounter: Payer: Self-pay | Admitting: Internal Medicine

## 2020-11-05 ENCOUNTER — Ambulatory Visit: Payer: Medicare HMO | Attending: Critical Care Medicine

## 2020-11-05 DIAGNOSIS — Z20822 Contact with and (suspected) exposure to covid-19: Secondary | ICD-10-CM | POA: Diagnosis not present

## 2020-11-05 NOTE — Progress Notes (Signed)
Location of tumor and Histology per Pathology Report: right lung 12/19/2017    Biopsy:  01/03/2018   Past/Anticipated interventions by surgeon, if any:    Past/Anticipated interventions by medical oncology, if any: Dr Aggie Cosier 80 mg p.o. daily status post 33 months of treatment    Pain issues, if any:  no   SAFETY ISSUES:  Prior radiation? no  Pacemaker/ICD? no  Possible current pregnancy? No, hysterectomy  Is the patient on methotrexate? no  Current Complaints / other details:  none     Vitals:   11/11/20 1350  BP: (!) 148/71  Pulse: 67  Resp: 18  Temp: 98.4 F (36.9 C)  SpO2: 96%  Weight: 131 lb (59.4 kg)  Height: 5\' 4"  (1.626 m)

## 2020-11-06 LAB — NOVEL CORONAVIRUS, NAA: SARS-CoV-2, NAA: NOT DETECTED

## 2020-11-06 LAB — SARS-COV-2, NAA 2 DAY TAT

## 2020-11-08 ENCOUNTER — Encounter: Payer: Self-pay | Admitting: Internal Medicine

## 2020-11-09 ENCOUNTER — Telehealth: Payer: Self-pay | Admitting: Physician Assistant

## 2020-11-09 ENCOUNTER — Other Ambulatory Visit: Payer: Self-pay

## 2020-11-09 ENCOUNTER — Ambulatory Visit (HOSPITAL_COMMUNITY)
Admission: RE | Admit: 2020-11-09 | Discharge: 2020-11-09 | Disposition: A | Discharge status: Home / Self Care | Payer: Medicare HMO | Source: Ambulatory Visit | Attending: Physician Assistant | Admitting: Physician Assistant

## 2020-11-09 DIAGNOSIS — C3491 Malignant neoplasm of unspecified part of right bronchus or lung: Secondary | ICD-10-CM | POA: Diagnosis not present

## 2020-11-09 DIAGNOSIS — J3489 Other specified disorders of nose and nasal sinuses: Secondary | ICD-10-CM | POA: Diagnosis not present

## 2020-11-09 DIAGNOSIS — C3490 Malignant neoplasm of unspecified part of unspecified bronchus or lung: Secondary | ICD-10-CM | POA: Diagnosis not present

## 2020-11-09 DIAGNOSIS — Z79899 Other long term (current) drug therapy: Secondary | ICD-10-CM | POA: Diagnosis not present

## 2020-11-09 DIAGNOSIS — D329 Benign neoplasm of meninges, unspecified: Secondary | ICD-10-CM | POA: Diagnosis not present

## 2020-11-09 DIAGNOSIS — R609 Edema, unspecified: Secondary | ICD-10-CM | POA: Diagnosis not present

## 2020-11-09 LAB — GLUCOSE, CAPILLARY: Glucose-Capillary: 88 mg/dL (ref 70–99)

## 2020-11-09 MED ORDER — FLUDEOXYGLUCOSE F - 18 (FDG) INJECTION
6.8000 | Freq: Once | INTRAVENOUS | Status: AC | PRN
  Administered 2020-11-09: 6.52 via INTRAVENOUS

## 2020-11-09 MED ORDER — GADOBUTROL 1 MMOL/ML IV SOLN
6.0000 mL | Freq: Once | INTRAVENOUS | Status: AC | PRN
  Administered 2020-11-09: 6 mL via INTRAVENOUS

## 2020-11-09 NOTE — Telephone Encounter (Signed)
I called her and let her know her brain MRI was negative for metastatic disease. She expressed understanding.

## 2020-11-10 ENCOUNTER — Telehealth: Payer: Self-pay | Admitting: Physician Assistant

## 2020-11-10 NOTE — Progress Notes (Signed)
Radiation Oncology         ([phone]) [phone] ________________________________  Initial Outpatient Consultation  Name: Allison [name] MRN: [id number]  Date: 11/11/2020  DOB: 25-Jun-1950  QI:[id number], Joelene Millin, MD  Curt Bears, MD   REFERRING PHYSICIAN: Curt Bears, MD  DIAGNOSIS:Stage IV (T2 a,N2, M1a)non-small cell lung cancer, adenocarcinoma diagnosed in July 2019 and presented with right upper lobe lung mass in addition to mediastinal lymphadenopathy as well as bilateral pulmonary nodules and malignant right pleural effusion.   HISTORY OF PRESENT ILLNESS::Allison [name] is a 70 y.o. female who is accompanied by her youngest daughter and fianc. she is seen as a courtesy of Dr. Julien Nordmann for an opinion concerning radiation therapy as part of management for her  non-small cell lung cancer. The patient initially presented to her primary care physician in June of 2019 with complaints of a dry cough and shortness of breath for three weeks. A chest x-ray was performed on 12/04/2017 and that showed large partially loculated right pleural effusion with scattered atelectasis in the right lung.  Underlying abnormalities in the right upper and right lower lobes including infiltrate and mass were not excluded.  This was followed by CT scan of the chest on December 11, 2017 and that showed a large right pleural effusion with volume loss on the right.  Within the right upper lobe there was a solid-appearing spiculated mass measuring 2.3 x 2.9 cm suspicious for primary lung carcinoma.  .  There were also several small nodules throughout the right lung and left lung suspicious for metastasis.  Some of the nodules in the right were noted to be intimately associated with the pleura and could represent pleural metastasis.  There was also a questionable soft tissue nodule in the left upper abdomen which was speculated to represent a metastatic lesion.  The patient was seen by Dr. Prescott Gum on 12/19/2017 where she  underwent an ultrasound-guided right-sided thoracentesis with drainage of 1.4 L of pleural fluid. The final cytology showed malignant cells consistent with metastatic adenocarcinoma of the primary lung. On 01/01/2018, the patient underwent pleural chest tube insertion under Dr. Nils Pyle for drainage of mmalignant right pleural effusion. The pleural drainage catheter was removed in on 11/07/2018.  The patient was started on Tagrisso (80mg ) on 01/29/2018 for treatment.   On 05/18/2020, the patient had a follow-up visit with Dr. Julien Nordmann where they discussed the results of a recent CT scan taken on 05/17/20. Her scan showed no concerning findings for disease progression.  The patient returned to see Dr. Nils Pyle on 06/30/2020 to receive, and go over the results from chest x-ray. Results from chest x-ray showed no reaccumulation of the right pleural effusion and the right upper lobe nodular density was noted to be stable.  On 10/13/2020, the patient followed up with Dr. Julien Nordmann to go over recently taken chest CT from 10/11/20. The CT scan unfortunately showed interval progression of the right apical lung mass in addition to progression of mediastinal lymphadenopathy; concerning for worsening of her disease. The patient was most recently seen by Dr. Toya Smothers on 10/27/20 and reported during the visit that overall she is feeling well.   Recent MRI of the brain showed no evidence of involvement in this area.  Given the recent chest CT scan findings the patient is now seen in radiation oncology to be considered for treatment.  PREVIOUS RADIATION THERAPY: No  PAST MEDICAL HISTORY:  Past Medical History:  Diagnosis Date  . Anxiety   . Arthritis   .  Asthma    exercise induced  . Depression    PMH  . GERD (gastroesophageal reflux disease)   . Glaucoma   . Hypertension   . lung ca dx'd 11/2017   right  . Malignant pleural effusion    right  . PONV (postoperative nausea and vomiting)   .  Pre-diabetes   . Raynaud's disease   . Raynaud's disease     PAST SURGICAL HISTORY: Past Surgical History:  Procedure Laterality Date  . ABDOMINAL HYSTERECTOMY     partial  . CHEST TUBE INSERTION Right 01/01/2018   Procedure: INSERTION PLEURAL DRAINAGE CATHETER;  Surgeon: Ivin Poot, MD;  Location: [facility];  Service: Thoracic;  Laterality: Right;  . COLONOSCOPY    . CORONARY STENT INTERVENTION N/A 06/16/2019   Procedure: CORONARY STENT INTERVENTION;  Surgeon: Jettie Booze, MD;  Location: [facility];  Service: Cardiovascular;  Laterality: N/A;  . DILATION AND CURETTAGE OF UTERUS    . EYE SURGERY     due to Glaucoma  . LEFT HEART CATH AND CORONARY ANGIOGRAPHY N/A 06/16/2019   Procedure: LEFT HEART CATH AND CORONARY ANGIOGRAPHY;  Surgeon: Jettie Booze, MD;  Location: [facility];  Service: Cardiovascular;  Laterality: N/A;  . REMOVAL OF PLEURAL DRAINAGE CATHETER Right 11/07/2018   Procedure: REMOVAL OF PLEURAL DRAINAGE CATHETER;  Surgeon: Ivin Poot, MD;  Location: [facility];  Service: Thoracic;  Laterality: Right;  . ROTATOR CUFF REPAIR    . TUBAL LIGATION    . WISDOM TOOTH EXTRACTION      FAMILY HISTORY:  Family History  Problem Relation Age of Onset  . Heart disease Sister   . Heart disease Brother   . Lung cancer Other     SOCIAL HISTORY:  Social History   Tobacco Use  . Smoking status: Never Smoker  . Smokeless tobacco: Never Used  Vaping Use  . Vaping Use: Never used  Substance Use Topics  . Alcohol use: Yes    Comment: up to 3 drinks per week  . Drug use: No    Comment: CBD oil     ALLERGIES:  Allergies  Allergen Reactions  . Penicillins Other (See Comments)    SYNCOPE PATIENT HAS HAD A PCN REACTION WITH IMMEDIATE RASH, FACIAL/TONGUE/THROAT SWELLING, SOB, OR LIGHTHEADEDNESS WITH HYPOTENSION:  #  #  YES  #  # Has patient had a PCN reaction causing severe rash involving mucus membranes or skin necrosis: No Has patient had a  PCN reaction that required hospitalization: No Has patient had a PCN reaction occurring within the last 10 years: No   . Vicodin [Hydrocodone-Acetaminophen] Other (See Comments)    Sped up heart and breathing  . Other Other (See Comments)    Glaucoma eye drop    MEDICATIONS:  Current Outpatient Medications  Medication Sig Dispense Refill  . ALPRAZolam (XANAX) 0.25 MG tablet Take 0.25 mg by mouth 2 (two) times daily as needed for anxiety.     Marland Kitchen amLODipine (NORVASC) 10 MG tablet Take 1 tablet (10 mg total) by mouth daily. 90 tablet 3  . aspirin 81 MG chewable tablet Chew 1 tablet (81 mg total) by mouth daily.    Marland Kitchen b complex vitamins capsule Take 1 capsule by mouth daily.    . benzonatate (TESSALON) 100 MG capsule Take by mouth 3 (three) times daily as needed for cough.    . carvedilol (COREG) 6.25 MG tablet Take 1 tablet (6.25 mg total) by mouth 2 (two)  times daily with a meal. Please schedule appointment for future refills. Thank you 180 tablet 1  . cetirizine (ZYRTEC) 5 MG tablet Take 5 mg by mouth daily.    Marland Kitchen Dextromethorphan HBr (DELSYM PO) Take by mouth as needed.    . dorzolamide-timolol (COSOPT) 22.3-6.8 MG/ML ophthalmic solution Instill 1 drop in both eyes twice daily    . Evolocumab (REPATHA SURECLICK) 809 MG/ML SOAJ Inject 1 pen into the skin every 14 (fourteen) days. 2 mL 11  . ezetimibe (ZETIA) 10 MG tablet Take 10 mg by mouth daily.     . hydrochlorothiazide (HYDRODIURIL) 25 MG tablet Take 25 mg by mouth daily. Currently taking 2 of the 12.5mg  daily    . latanoprost (XALATAN) 0.005 % ophthalmic solution Place 1 drop into both eyes at bedtime.     Marland Kitchen osimertinib mesylate (TAGRISSO) 80 MG tablet Take 1 tablet (80 mg total) by mouth daily. 30 tablet 3  . RESTASIS 0.05 % ophthalmic emulsion 1 drop 2 (two) times daily.    Marland Kitchen VITAMIN D-VITAMIN K PO Take 1 tablet by mouth daily.    Marland Kitchen acetaminophen (TYLENOL) 500 MG tablet Take 1,000 mg by mouth every 6 (six) hours as needed (pain).   (Patient not taking: Reported on 11/11/2020)    . Acetylcysteine (N-ACETYL-L-CYSTEINE PO) Take 1 tablet by mouth once. (Patient not taking: Reported on 11/11/2020)    . Biotin 1000 MCG CHEW Chew 10 mg by mouth daily. (Patient not taking: Reported on 11/11/2020)    . estradiol (ESTRACE) 0.1 MG/GM vaginal cream Place 1 Applicatorful vaginally 2 (two) times a week. (Patient not taking: Reported on 11/11/2020)    . OVER THE COUNTER MEDICATION Collagen and Peptides (Patient not taking: Reported on 11/11/2020)    . Probiotic Product (PROBIOTIC-10 PO) Take by mouth. (Patient not taking: Reported on 11/11/2020)    . testosterone cypionate (DEPOTESTOSTERONE CYPIONATE) 200 MG/ML injection Depo-Testosterone 200 mg/mL intramuscular oil  Given:    GM (Patient not taking: Reported on 11/11/2020)     No current facility-administered medications for this encounter.    REVIEW OF SYSTEMS:  A 10+ POINT REVIEW OF SYSTEMS WAS OBTAINED including neurology, dermatology, psychiatry, cardiac, respiratory, lymph, extremities, GI, GU, musculoskeletal, constitutional, reproductive, HEENT.  Patient reports recurrence of her cough which is how she initially presented.  She denies any pain in the chest area shortness of breath or hemoptysis.  She denies any new bony pain.  Her appetite is on and off.  She denies any headaches or dizziness.   PHYSICAL EXAM:  height is 5\' 4"  (1.626 m) and weight is 131 lb (59.4 kg). Her temperature is 98.4 F (36.9 C). Her blood pressure is 148/71 (abnormal) and her pulse is 67. Her respiration is 18 and oxygen saturation is 96%.   General: Alert and oriented, in no acute distress HEENT: Head is normocephalic. Extraocular movements are intact. Oropharynx is clear.  Teeth in good repair Neck: Neck is supple, no palpable cervical or supraclavicular lymphadenopathy. Heart: Regular in rate and rhythm with no murmurs, rubs, or gallops. Chest: Clear to auscultation bilaterally, with no rhonchi, wheezes, or  rales. Abdomen: Soft, nontender, nondistended, with no rigidity or guarding. Extremities: No cyanosis or edema. Lymphatics: see Neck Exam Skin: No concerning lesions. Musculoskeletal: symmetric strength and muscle tone throughout. Neurologic: Cranial nerves II through XII are grossly intact. No obvious focalities. Speech is fluent. Coordination is intact. Psychiatric: Judgment and insight are intact. Affect is appropriate.   ECOG = 1  0 - Asymptomatic (  Fully active, able to carry on all predisease activities without restriction)  1 - Symptomatic but completely ambulatory (Restricted in physically strenuous activity but ambulatory and able to carry out work of a light or sedentary nature. For example, light housework, office work)  2 - Symptomatic, <50% in bed during the day (Ambulatory and capable of all self care but unable to carry out any work activities. Up and about more than 50% of waking hours)  3 - Symptomatic, >50% in bed, but not bedbound (Capable of only limited self-care, confined to bed or chair 50% or more of waking hours)  4 - Bedbound (Completely disabled. Cannot carry on any self-care. Totally confined to bed or chair)  5 - Death   Eustace Pen MM, Creech RH, Tormey DC, et al. [id number]). "Toxicity and response criteria of the [facility] Group". [place] Oncol. 5 (6): 649-55  LABORATORY DATA:  Lab Results  Component Value Date   WBC 4.0 10/11/2020   HGB 12.0 10/11/2020   HCT 36.9 10/11/2020   MCV 80.9 10/11/2020   PLT 185 10/11/2020   NEUTROABS 1.7 10/11/2020   Lab Results  Component Value Date   NA 141 10/11/2020   K 3.3 (L) 10/11/2020   CL 102 10/11/2020   CO2 28 10/11/2020   GLUCOSE 97 10/11/2020   CREATININE 1.03 (H) 10/11/2020   CALCIUM 9.6 10/11/2020      RADIOGRAPHY: MR Brain W Wo Contrast  Result Date: 11/09/2020 CLINICAL DATA:  Lung cancer staging.  Non-small cell lung cancer EXAM: MRI HEAD WITHOUT AND WITH CONTRAST TECHNIQUE:  Multiplanar, multiecho pulse sequences of the brain and surrounding structures were obtained without and with intravenous contrast. CONTRAST:  78mL GADAVIST GADOBUTROL 1 MMOL/ML IV SOLN COMPARISON:  MRI head 01/09/2020 FINDINGS: Brain: Ventricle size normal. Scattered small white matter hyperintensities bilaterally unchanged from the prior study. Negative for acute infarct or hemorrhage Negative for metastatic disease to the brain. Small meningioma right frontal lobe axial image 78. Small meningioma right petrous ridge approximately 8 mm, unchanged. Axial image 34. Vascular: Normal arterial flow voids Skull and upper cervical spine: No focal abnormality Sinuses/Orbits: Mucosal edema paranasal sinuses.  Negative orbit Other: None IMPRESSION: Negative for metastatic disease No acute abnormality. Scattered white matter hyperintensities unchanged from the prior study. Stable small meningiomata Electronically Signed   By: Franchot Gallo M.D.   On: 11/09/2020 13:16   NM PET Image Restag (PS) Skull Base To Thigh  Result Date: 11/10/2020 CLINICAL DATA:  Subsequent treatment strategy for non-small cell lung cancer. EXAM: NUCLEAR MEDICINE PET SKULL BASE TO THIGH TECHNIQUE: 6.5 mCi F-18 FDG was injected intravenously. Full-ring PET imaging was performed from the skull base to thigh after the radiotracer. CT data was obtained and used for attenuation correction and anatomic localization. Fasting blood glucose: 88 mg/dl COMPARISON:  CT chest abdomen pelvis dated 10/11/2020 FINDINGS: Mediastinal blood pool activity: SUV max 2.5 Liver activity: SUV max NA NECK: No hypermetabolic cervical lymphadenopathy. Incidental CT findings: none CHEST: 4.4 x 3.5 cm right apical mass, mildly progressive, max SUV 11.5. This corresponds to the patient's known primary bronchogenic neoplasm. Adjacent mild lymphangitic carcinomatosis is possible in the right upper lobe (series 4/image 48). Trace right pleural effusion with pleural thickening,  unchanged. 8 mm irregular nodule in the anterior right lower lobe (series 4/image 72), unchanged, beneath the size threshold for PET sensitivity. Associated small thoracic nodes, grossly unchanged from recent CT, including: --5 mm short axis right supraclavicular node (series 4/image 35), max SUV  3.6 --9 mm short axis node at the right thoracic inlet (series 4/image 45), max SUV 7.4 --10 mm short axis right paratracheal node (series 4/image 52), max SUV 8.2 --8 mm short axis AP window node (series 4/image 56), max SUV 5.7 --14 mm short axis subcarinal node (series 4/image 60), max SUV 8.6 Incidental CT findings: Coronary atherosclerosis of the LAD and right coronary artery. Mild atherosclerotic calcifications of the aortic arch. ABDOMEN/PELVIS: No abnormal hypermetabolism in the liver, spleen, pancreas, or right adrenal gland. Mild hypermetabolism in the left adrenal gland, max SUV 3.3, but without associated nodule on CT. No hypermetabolic abdominopelvic lymphadenopathy. Incidental CT findings: Atherosclerotic calcifications abdominal aorta and branch vessels. Trace pelvic ascites. SKELETON: No focal hypermetabolic activity to suggest skeletal metastasis. Incidental CT findings: none IMPRESSION: 4.4 cm right apical mass, corresponding to the patient's known primary bronchogenic neoplasm, mildly progressive. Possible lymphangitic carcinomatosis in the right upper lobe. Trace right pleural effusion. Small thoracic nodal metastases, grossly unchanged. Electronically Signed   By: Julian Hy M.D.   On: 11/10/2020 12:42      IMPRESSION: Stage IV (T2 a,N2, M1a)non-small cell lung cancer, adenocarcinoma diagnosed in July 2019 and presented with right upper lobe lung mass in addition to mediastinal lymphadenopathy as well as bilateral pulmonary nodules and malignant right pleural effusion.  The patient has had an excellent response to Tagrisso over the past several months since her diagnosis.  She however now  has progression at the primary site.  She would be a candidate for radiation therapy to the right upper lung mass as well as associated mediastinal lymph nodes that are noted on her recent PET scan.  We talked about two radiation options, one being a more palliative approach for approximately 2 to 3 weeks or a more definitive course of treatment over approximately 6 weeks.  After careful consideration and discussion with her family she would like to proceed with the more definitive course of treatment over 6 weeks with the understanding that she could stop at any time if her symptoms become too significant.  She does understand that the major issue for her will be esophagitis which can be quite bothersome towards the end of her radiation therapy.  Today, I talked to the patient and family about the findings and work-up thus far.  We discussed the natural history of adenocarcinoma lung and general treatment, highlighting the role of radiotherapy in the management.  We discussed the available radiation techniques, and focused on the details of logistics and delivery.  We reviewed the anticipated acute and late sequelae associated with radiation in this setting.  The patient was encouraged to ask questions that I answered to the best of my ability.  A patient consent form was discussed and signed.  We retained a copy for our records.  The patient would like to proceed with radiation and will be scheduled for CT simulation.  PLAN: Patient will return for CT simulation on May 31 with treatments to begin June 6 or 7.  Anticipate 6 weeks of radiation therapy.   60 minutes of total time was spent for this patient encounter, including preparation, face-to-face counseling with the patient and coordination of care, physical exam, and documentation of the encounter.   ------------------------------------------------  Blair Promise, PhD, MD  This document serves as a record of services personally performed by  [name], MD. It was created on his behalf by Roney Mans, a trained medical scribe. The creation of this record is based on the scribe's  personal observations and the provider's statements to them. This document has been checked and approved by the attending provider.

## 2020-11-10 NOTE — Telephone Encounter (Signed)
I called the patient to review the PET scan with her. She had several questions which I reviewed with her to her satisfaction. Reviewed that the plan is the same. She is scheduled to see Dr. Sondra Come tomorrow to consider radiation to the primary lung tumor and lymph nodes. Reviewed Dr. Sondra Come will discuss the plan with her tomorrow.

## 2020-11-11 ENCOUNTER — Encounter: Payer: Self-pay | Admitting: Radiation Oncology

## 2020-11-11 ENCOUNTER — Other Ambulatory Visit: Payer: Self-pay

## 2020-11-11 ENCOUNTER — Ambulatory Visit
Admission: RE | Admit: 2020-11-11 | Discharge: 2020-11-11 | Disposition: A | Discharge status: Home / Self Care | Payer: Medicare HMO | Source: Ambulatory Visit | Attending: Radiation Oncology | Admitting: Radiation Oncology

## 2020-11-11 VITALS — BP 148/71 | HR 67 | Temp 98.4°F | Resp 18 | Ht 64.0 in | Wt 131.0 lb

## 2020-11-11 DIAGNOSIS — I7300 Raynaud's syndrome without gangrene: Secondary | ICD-10-CM | POA: Diagnosis not present

## 2020-11-11 DIAGNOSIS — I10 Essential (primary) hypertension: Secondary | ICD-10-CM | POA: Insufficient documentation

## 2020-11-11 DIAGNOSIS — D329 Benign neoplasm of meninges, unspecified: Secondary | ICD-10-CM | POA: Insufficient documentation

## 2020-11-11 DIAGNOSIS — Z801 Family history of malignant neoplasm of trachea, bronchus and lung: Secondary | ICD-10-CM | POA: Insufficient documentation

## 2020-11-11 DIAGNOSIS — J910 Malignant pleural effusion: Secondary | ICD-10-CM | POA: Diagnosis not present

## 2020-11-11 DIAGNOSIS — Z7982 Long term (current) use of aspirin: Secondary | ICD-10-CM | POA: Diagnosis not present

## 2020-11-11 DIAGNOSIS — Z79899 Other long term (current) drug therapy: Secondary | ICD-10-CM | POA: Insufficient documentation

## 2020-11-11 DIAGNOSIS — C771 Secondary and unspecified malignant neoplasm of intrathoracic lymph nodes: Secondary | ICD-10-CM | POA: Diagnosis not present

## 2020-11-11 DIAGNOSIS — Z923 Personal history of irradiation: Secondary | ICD-10-CM | POA: Insufficient documentation

## 2020-11-11 DIAGNOSIS — K219 Gastro-esophageal reflux disease without esophagitis: Secondary | ICD-10-CM | POA: Insufficient documentation

## 2020-11-11 DIAGNOSIS — C3491 Malignant neoplasm of unspecified part of right bronchus or lung: Secondary | ICD-10-CM | POA: Diagnosis not present

## 2020-11-11 DIAGNOSIS — C3411 Malignant neoplasm of upper lobe, right bronchus or lung: Secondary | ICD-10-CM | POA: Diagnosis not present

## 2020-11-11 NOTE — Progress Notes (Signed)
See MD note for nursing evaluation. °

## 2020-11-16 ENCOUNTER — Other Ambulatory Visit: Payer: Self-pay

## 2020-11-16 ENCOUNTER — Ambulatory Visit
Admission: RE | Admit: 2020-11-16 | Discharge: 2020-11-16 | Disposition: A | Discharge status: Home / Self Care | Payer: Medicare HMO | Source: Ambulatory Visit | Attending: Radiation Oncology | Admitting: Radiation Oncology

## 2020-11-16 DIAGNOSIS — C3411 Malignant neoplasm of upper lobe, right bronchus or lung: Secondary | ICD-10-CM | POA: Diagnosis not present

## 2020-11-16 DIAGNOSIS — C3491 Malignant neoplasm of unspecified part of right bronchus or lung: Secondary | ICD-10-CM | POA: Diagnosis not present

## 2020-11-17 ENCOUNTER — Encounter: Payer: Self-pay | Admitting: General Practice

## 2020-11-17 ENCOUNTER — Ambulatory Visit: Payer: Medicare HMO | Admitting: Physician Assistant

## 2020-11-17 ENCOUNTER — Other Ambulatory Visit: Payer: Self-pay

## 2020-11-17 ENCOUNTER — Telehealth: Payer: Self-pay | Admitting: *Deleted

## 2020-11-17 VITALS — BP 149/52 | HR 73 | Temp 98.5°F | Ht 64.0 in | Wt 131.0 lb

## 2020-11-17 DIAGNOSIS — C3491 Malignant neoplasm of unspecified part of right bronchus or lung: Secondary | ICD-10-CM

## 2020-11-17 DIAGNOSIS — J0110 Acute frontal sinusitis, unspecified: Secondary | ICD-10-CM

## 2020-11-17 LAB — POC COVID19 BINAXNOW: SARS Coronavirus 2 Ag: NEGATIVE

## 2020-11-17 MED ORDER — AZITHROMYCIN 250 MG PO TABS: ORAL_TABLET | ORAL | 0 refills | Status: DC

## 2020-11-17 MED ORDER — FLUTICASONE PROPIONATE 50 MCG/ACT NA SUSP: 2.0000 | Freq: Every day | NASAL | 6 refills | Status: DC

## 2020-11-17 NOTE — Progress Notes (Signed)
Established Patient Office Visit  Subjective:  Patient ID: Allison [name], female    DOB: Mar 14, 1951  Age: 70 y.o. MRN: [id number]  CC:  Chief Complaint  Patient presents with  . Cough    HPI FAYETTE GASNER states that she has been having a cough, but not able to bring up mucus, headache, both ears hurt, thick cloudy discharge , no fever or chills, slight sinus tenderness for the past two weeks.  States that she has been taking delsym, and zyrtec, tessalon 200 mg TID, helps for a a short time  Cough is not keeping her up at night.  No sick contacts   Past Medical History:  Diagnosis Date  . Anxiety   . Arthritis   . Asthma    exercise induced  . Depression    PMH  . GERD (gastroesophageal reflux disease)   . Glaucoma   . Hypertension   . lung ca dx'd 11/2017   right  . Malignant pleural effusion    right  . PONV (postoperative nausea and vomiting)   . Pre-diabetes   . Raynaud's disease   . Raynaud's disease     Past Surgical History:  Procedure Laterality Date  . ABDOMINAL HYSTERECTOMY     partial  . CHEST TUBE INSERTION Right 01/01/2018   Procedure: INSERTION PLEURAL DRAINAGE CATHETER;  Surgeon: Ivin Poot, MD;  Location: [facility];  Service: Thoracic;  Laterality: Right;  . COLONOSCOPY    . CORONARY STENT INTERVENTION N/A 06/16/2019   Procedure: CORONARY STENT INTERVENTION;  Surgeon: Jettie Booze, MD;  Location: [facility];  Service: Cardiovascular;  Laterality: N/A;  . DILATION AND CURETTAGE OF UTERUS    . EYE SURGERY     due to Glaucoma  . LEFT HEART CATH AND CORONARY ANGIOGRAPHY N/A 06/16/2019   Procedure: LEFT HEART CATH AND CORONARY ANGIOGRAPHY;  Surgeon: Jettie Booze, MD;  Location: [facility];  Service: Cardiovascular;  Laterality: N/A;  . REMOVAL OF PLEURAL DRAINAGE CATHETER Right 11/07/2018   Procedure: REMOVAL OF PLEURAL DRAINAGE CATHETER;  Surgeon: Ivin Poot, MD;  Location: [facility];  Service: Thoracic;   Laterality: Right;  . ROTATOR CUFF REPAIR    . TUBAL LIGATION    . WISDOM TOOTH EXTRACTION      Family History  Problem Relation Age of Onset  . Heart disease Sister   . Heart disease Brother   . Lung cancer Other     Social History   Socioeconomic History  . Marital status: Widowed    Spouse name: Not on file  . Number of children: 3  . Years of education: Not on file  . Highest education level: Not on file  Occupational History    Employer: [employer]  Tobacco Use  . Smoking status: Never Smoker  . Smokeless tobacco: Never Used  Vaping Use  . Vaping Use: Never used  Substance and Sexual Activity  . Alcohol use: Yes    Comment: up to 3 drinks per week  . Drug use: No    Comment: CBD oil   . Sexual activity: Not Currently    Comment: Hysterectomy  Other Topics Concern  . Not on file  Social History Narrative  . Not on file   Social Determinants of Health   Financial Resource Strain: Not on file  Food Insecurity: Not on file  Transportation Needs: Not on file  Physical Activity: Not on file  Stress: Not on file  Social Connections: Not on file  Intimate Partner Violence: Not on file    Outpatient Medications Prior to Visit  Medication Sig Dispense Refill  . acetaminophen (TYLENOL) 500 MG tablet Take 1,000 mg by mouth every 6 (six) hours as needed (pain).  (Patient not taking: Reported on 11/11/2020)    . Acetylcysteine (N-ACETYL-L-CYSTEINE PO) Take 1 tablet by mouth once. (Patient not taking: Reported on 11/11/2020)    . ALPRAZolam (XANAX) 0.25 MG tablet Take 0.25 mg by mouth 2 (two) times daily as needed for anxiety.     Marland Kitchen amLODipine (NORVASC) 10 MG tablet Take 1 tablet (10 mg total) by mouth daily. 90 tablet 3  . aspirin 81 MG chewable tablet Chew 1 tablet (81 mg total) by mouth daily.    Marland Kitchen b complex vitamins capsule Take 1 capsule by mouth daily.    . benzonatate (TESSALON) 100 MG capsule Take by mouth 3 (three) times daily as needed for cough.    . Biotin  1000 MCG CHEW Chew 10 mg by mouth daily. (Patient not taking: Reported on 11/11/2020)    . carvedilol (COREG) 6.25 MG tablet Take 1 tablet (6.25 mg total) by mouth 2 (two) times daily with a meal. Please schedule appointment for future refills. Thank you 180 tablet 1  . cetirizine (ZYRTEC) 5 MG tablet Take 5 mg by mouth daily.    Marland Kitchen Dextromethorphan HBr (DELSYM PO) Take by mouth as needed.    . dorzolamide-timolol (COSOPT) 22.3-6.8 MG/ML ophthalmic solution Instill 1 drop in both eyes twice daily    . estradiol (ESTRACE) 0.1 MG/GM vaginal cream Place 1 Applicatorful vaginally 2 (two) times a week. (Patient not taking: Reported on 11/11/2020)    . Evolocumab (REPATHA SURECLICK) 390 MG/ML SOAJ Inject 1 pen into the skin every 14 (fourteen) days. 2 mL 11  . ezetimibe (ZETIA) 10 MG tablet Take 10 mg by mouth daily.     . hydrochlorothiazide (HYDRODIURIL) 25 MG tablet Take 25 mg by mouth daily. Currently taking 2 of the 12.63m daily    . latanoprost (XALATAN) 0.005 % ophthalmic solution Place 1 drop into both eyes at bedtime.     .Marland Kitchenosimertinib mesylate (TAGRISSO) 80 MG tablet Take 1 tablet (80 mg total) by mouth daily. 30 tablet 3  . OVER THE COUNTER MEDICATION Collagen and Peptides (Patient not taking: Reported on 11/11/2020)    . Probiotic Product (PROBIOTIC-10 PO) Take by mouth. (Patient not taking: Reported on 11/11/2020)    . RESTASIS 0.05 % ophthalmic emulsion 1 drop 2 (two) times daily.    .Marland Kitchentestosterone cypionate (DEPOTESTOSTERONE CYPIONATE) 200 MG/ML injection Depo-Testosterone 200 mg/mL intramuscular oil  Given:    GM (Patient not taking: Reported on 11/11/2020)    . VITAMIN D-VITAMIN K PO Take 1 tablet by mouth daily.     No facility-administered medications prior to visit.    Allergies  Allergen Reactions  . Penicillins Other (See Comments)    SYNCOPE PATIENT HAS HAD A PCN REACTION WITH IMMEDIATE RASH, FACIAL/TONGUE/THROAT SWELLING, SOB, OR LIGHTHEADEDNESS WITH HYPOTENSION:  #  #  YES  #   # Has patient had a PCN reaction causing severe rash involving mucus membranes or skin necrosis: No Has patient had a PCN reaction that required hospitalization: No Has patient had a PCN reaction occurring within the last 10 years: No   . Vicodin [Hydrocodone-Acetaminophen] Other (See Comments)    Sped up heart and breathing  . Other Other (See Comments)    Glaucoma eye drop  ROS Review of Systems  Constitutional: Negative for chills and fever.  HENT: Positive for congestion and sinus pressure. Negative for sneezing, sore throat and trouble swallowing.   Eyes: Negative.   Respiratory: Positive for cough. Negative for shortness of breath and wheezing.   Cardiovascular: Negative for chest pain.  Gastrointestinal: Negative.   Endocrine: Negative.   Genitourinary: Negative.   Musculoskeletal: Negative.   Skin: Negative.   Allergic/Immunologic: Negative.   Neurological: Negative.   Hematological: Negative.   Psychiatric/Behavioral: Negative.       Objective:    Physical Exam Vitals reviewed.  Constitutional:      Appearance: Normal appearance.  HENT:     Head: Normocephalic and atraumatic.     Right Ear: Ear canal and external ear normal. Tympanic membrane is bulging.     Left Ear: Tympanic membrane, ear canal and external ear normal.     Nose: Congestion present.     Right Turbinates: Swollen.     Left Turbinates: Swollen.     Right Sinus: Maxillary sinus tenderness present.     Left Sinus: Maxillary sinus tenderness present.     Mouth/Throat:     Lips: Pink.     Mouth: Mucous membranes are moist.     Pharynx: Oropharynx is clear.  Eyes:     Extraocular Movements: Extraocular movements intact.     Conjunctiva/sclera: Conjunctivae normal.     Pupils: Pupils are equal, round, and reactive to light.  Cardiovascular:     Rate and Rhythm: Normal rate and regular rhythm.     Pulses: Normal pulses.     Heart sounds: Normal heart sounds.  Pulmonary:     Effort:  Pulmonary effort is normal.     Breath sounds: Normal breath sounds. No wheezing.  Musculoskeletal:        General: Normal range of motion.     Cervical back: Normal range of motion and neck supple.  Lymphadenopathy:     Cervical: No cervical adenopathy.  Skin:    General: Skin is warm and dry.  Neurological:     General: No focal deficit present.     Mental Status: She is alert and oriented to person, place, and time.  Psychiatric:        Mood and Affect: Mood normal.        Behavior: Behavior normal.        Thought Content: Thought content normal.        Judgment: Judgment normal.     BP (!) 149/52   Pulse 73   Temp 98.5 F (36.9 C)   Ht '5\' 4"'  (1.626 m)   Wt 131 lb (59.4 kg)   LMP  (LMP Unknown)   BMI 22.49 kg/m  Wt Readings from Last 3 Encounters:  11/17/20 131 lb (59.4 kg)  11/11/20 131 lb (59.4 kg)  10/27/20 131 lb 4.8 oz (59.6 kg)     Health Maintenance Due  Topic Date Due  . Hepatitis C Screening  Never done  . COLONOSCOPY (Pts 45-53yr Insurance coverage will need to be confirmed)  Never done  . Zoster Vaccines- Shingrix (1 of 2) Never done  . MAMMOGRAM  07/27/2013  . PNA vac Low Risk Adult (1 of 2 - PCV13) Never done  . COVID-19 Vaccine (2 - Pfizer risk 4-dose series) 06/21/2020    There are no preventive care reminders to display for this patient.  Lab Results  Component Value Date   TSH 1.774 06/13/2019   Lab Results  Component  Value Date   WBC 4.0 10/11/2020   HGB 12.0 10/11/2020   HCT 36.9 10/11/2020   MCV 80.9 10/11/2020   PLT 185 10/11/2020   Lab Results  Component Value Date   NA 141 10/11/2020   K 3.3 (L) 10/11/2020   CO2 28 10/11/2020   GLUCOSE 97 10/11/2020   BUN 14 10/11/2020   CREATININE 1.03 (H) 10/11/2020   BILITOT 0.3 11/01/2020   ALKPHOS 65 11/01/2020   AST 37 11/01/2020   ALT 37 (H) 11/01/2020   PROT 7.1 11/01/2020   ALBUMIN 4.6 11/01/2020   CALCIUM 9.6 10/11/2020   ANIONGAP 11 10/11/2020   EGFR 55 (L) 09/13/2020    Lab Results  Component Value Date   CHOL 153 11/01/2020   Lab Results  Component Value Date   HDL 64 11/01/2020   Lab Results  Component Value Date   LDLCALC 74 11/01/2020   Lab Results  Component Value Date   TRIG 77 11/01/2020   Lab Results  Component Value Date   CHOLHDL 2.4 11/01/2020   Lab Results  Component Value Date   HGBA1C 5.5 06/13/2019      Assessment & Plan:   Problem List Items Addressed This Visit      Respiratory   Adenocarcinoma of right lung, stage 4 (HCC)   Relevant Medications   azithromycin (ZITHROMAX) 250 MG tablet    Other Visit Diagnoses    Acute non-recurrent frontal sinusitis    -  Primary   Relevant Medications   fluticasone (FLONASE) 50 MCG/ACT nasal spray   azithromycin (ZITHROMAX) 250 MG tablet   Other Relevant Orders   POC COVID-19 (Completed)      Meds ordered this encounter  Medications  . fluticasone (FLONASE) 50 MCG/ACT nasal spray    Sig: Place 2 sprays into both nostrils daily.    Dispense:  16 g    Refill:  6    Order Specific Question:   Supervising Provider    Answer:   Asencion Noble E [[id number]]  . azithromycin (ZITHROMAX) 250 MG tablet    Sig: Take 2 tabs PO day 1, then take 1 tab PO once daily    Dispense:  6 tablet    Refill:  0    Order Specific Question:   Supervising Provider    Answer:   Joya Gaskins, PATRICK E [[id number]]  1. Acute non-recurrent frontal sinusitis Rapid COVID test negative.  Trial azithromycin, Flonase.  Continue supportive care.  Red flags given for prompt reevaluation. - POC COVID-19 - fluticasone (FLONASE) 50 MCG/ACT nasal spray; Place 2 sprays into both nostrils daily.  Dispense: 16 g; Refill: 6 - azithromycin (ZITHROMAX) 250 MG tablet; Take 2 tabs PO day 1, then take 1 tab PO once daily  Dispense: 6 tablet; Refill: 0  2. Adenocarcinoma of right lung, stage 4 ([place])    I have reviewed the patient's medical history (PMH, PSH, Social History, Family History, Medications, and allergies) , and  have been updated if relevant. I spent 34 minutes reviewing chart and  face to face time with patient.      Follow-up: Return if symptoms worsen or fail to improve.    Loraine Grip Mayers, PA-C

## 2020-11-17 NOTE — Patient Instructions (Signed)
Your rapid COVID test was negative.  I encourage you to take the azithromycin as directed, and also use Flonase 2 squirts in each nostril twice a day until your symptoms start to resolve and then you can reduce to as directed on the package.  Make sure that you continue using the Zyrtec, Tessalon Perles as needed, drink lots of fluids and get plenty of rest.  I encourage you to follow a low sodium diet to help with your ankle swelling, make sure to keep your feet elevated when able.  Please let us know if there is anything else we can do for you.  Kennieth Rad, PA-C Physician Assistant [facility] Medicine [email or web address]    Sinusitis, Adult Sinusitis is inflammation of your sinuses. Sinuses are hollow spaces in the bones around your face. Your sinuses are located:  Around your eyes.  In the middle of your forehead.  Behind your nose.  In your cheekbones. Mucus normally drains out of your sinuses. When your nasal tissues become inflamed or swollen, mucus can become trapped or blocked. This allows bacteria, viruses, and fungi to grow, which leads to infection. Most infections of the sinuses are caused by a virus. Sinusitis can develop quickly. It can last for up to 4 weeks (acute) or for more than 12 weeks (chronic). Sinusitis often develops after a cold. What are the causes? This condition is caused by anything that creates swelling in the sinuses or stops mucus from draining. This includes:  Allergies.  Asthma.  Infection from bacteria or viruses.  Deformities or blockages in your nose or sinuses.  Abnormal growths in the nose (nasal polyps).  Pollutants, such as chemicals or irritants in the air.  Infection from fungi (rare). What increases the risk? You are more likely to develop this condition if you:  Have a weak body defense system (immune system).  Do a lot of swimming or diving.  Overuse nasal  sprays.  Smoke. What are the signs or symptoms? The main symptoms of this condition are pain and a feeling of pressure around the affected sinuses. Other symptoms include:  Stuffy nose or congestion.  Thick drainage from your nose.  Swelling and warmth over the affected sinuses.  Headache.  Upper toothache.  A cough that may get worse at night.  Extra mucus that collects in the throat or the back of the nose (postnasal drip).  Decreased sense of smell and taste.  Fatigue.  A fever.  Sore throat.  Bad breath. How is this diagnosed? This condition is diagnosed based on:  Your symptoms.  Your medical history.  A physical exam.  Tests to find out if your condition is acute or chronic. This may include: ? Checking your nose for nasal polyps. ? Viewing your sinuses using a device that has a light (endoscope). ? Testing for allergies or bacteria. ? Imaging tests, such as an MRI or CT scan. In rare cases, a bone biopsy may be done to rule out more serious types of fungal sinus disease. How is this treated? Treatment for sinusitis depends on the cause and whether your condition is chronic or acute.  If caused by a virus, your symptoms should go away on their own within 10 days. You may be given medicines to relieve symptoms. They include: ? Medicines that shrink swollen nasal passages (topical intranasal decongestants). ? Medicines that treat allergies (antihistamines). ? A spray that eases inflammation of the nostrils (topical intranasal corticosteroids). ? Rinses that help get rid of thick  mucus in your nose (nasal saline washes).  If caused by bacteria, your health care provider may recommend waiting to see if your symptoms improve. Most bacterial infections will get better without antibiotic medicine. You may be given antibiotics if you have: ? A severe infection. ? A weak immune system.  If caused by narrow nasal passages or nasal polyps, you may need to have  surgery. Follow these instructions at home: Medicines  Take, use, or apply over-the-counter and prescription medicines only as told by your health care provider. These may include nasal sprays.  If you were prescribed an antibiotic medicine, take it as told by your health care provider. Do not stop taking the antibiotic even if you start to feel better. Hydrate and humidify  Drink enough fluid to keep your urine pale yellow. Staying hydrated will help to thin your mucus.  Use a cool mist humidifier to keep the humidity level in your home above 50%.  Inhale steam for 10-15 minutes, 3-4 times a day, or as told by your health care provider. You can do this in the bathroom while a hot shower is running.  Limit your exposure to cool or dry air.   Rest  Rest as much as possible.  Sleep with your head raised (elevated).  Make sure you get enough sleep each night. General instructions  Apply a warm, moist washcloth to your face 3-4 times a day or as told by your health care provider. This will help with discomfort.  Wash your hands often with soap and water to reduce your exposure to germs. If soap and water are not available, use hand sanitizer.  Do not smoke. Avoid being around people who are smoking (secondhand smoke).  Keep all follow-up visits as told by your health care provider. This is important.   Contact a health care provider if:  You have a fever.  Your symptoms get worse.  Your symptoms do not improve within 10 days. Get help right away if:  You have a severe headache.  You have persistent vomiting.  You have severe pain or swelling around your face or eyes.  You have vision problems.  You develop confusion.  Your neck is stiff.  You have trouble breathing. Summary  Sinusitis is soreness and inflammation of your sinuses. Sinuses are hollow spaces in the bones around your face.  This condition is caused by nasal tissues that become inflamed or swollen.  The swelling traps or blocks the flow of mucus. This allows bacteria, viruses, and fungi to grow, which leads to infection.  If you were prescribed an antibiotic medicine, take it as told by your health care provider. Do not stop taking the antibiotic even if you start to feel better.  Keep all follow-up visits as told by your health care provider. This is important. This information is not intended to replace advice given to you by your health care provider. Make sure you discuss any questions you have with your health care provider. Document Revised: 11/05/2017 Document Reviewed: 11/05/2017 Elsevier Patient Education  2021 [employer].

## 2020-11-17 NOTE — Telephone Encounter (Signed)
I received a message from Ms. Allison [name]. I called her back and was unable to reach her. I did leave vm message with my name and phone number to call.

## 2020-11-17 NOTE — Progress Notes (Signed)
[facility] Spiritual Care Note  Referred per [facility] Screening protocol via Lauren Somers/LCSW for spiritual and emotional support. Allison [name] by phone. She was very welcoming of chaplain support and reports strong faith and upbeat attitude. Allison [name] is aware of ongoing Spiritual Care and [facility] availability and plans to phone to set up in-person appointment as needed.   [place], [place], [facility] Pager [phone] Voicemail [phone]

## 2020-11-18 DIAGNOSIS — J0110 Acute frontal sinusitis, unspecified: Secondary | ICD-10-CM | POA: Insufficient documentation

## 2020-11-19 ENCOUNTER — Encounter: Payer: Self-pay | Admitting: General Practice

## 2020-11-19 ENCOUNTER — Encounter: Payer: Self-pay | Admitting: Radiation Oncology

## 2020-11-19 NOTE — Progress Notes (Signed)
[facility] Spiritual Care Note  Followed up with Allison [name] by phone per referral from [facility]/RN. Allison [name] reported feeling "much better" after beginning treatment for a sinus infection--both physically and emotionally. (Trying to determine which of her providers to see for her complaints was a source of stress.) She was out with friends, reports good support, and knows to contact Team whenever needed/desired.   [place], [place], [facility] Pager [phone] Voicemail [phone]

## 2020-11-21 DIAGNOSIS — C3491 Malignant neoplasm of unspecified part of right bronchus or lung: Secondary | ICD-10-CM | POA: Diagnosis not present

## 2020-11-21 DIAGNOSIS — C3411 Malignant neoplasm of upper lobe, right bronchus or lung: Secondary | ICD-10-CM | POA: Diagnosis not present

## 2020-11-23 ENCOUNTER — Telehealth: Payer: Self-pay | Admitting: *Deleted

## 2020-11-23 NOTE — Telephone Encounter (Signed)
I called Ms. Allison [name] to check on her today.  I listened as she updated me on how she is doing.  She is excited to start XRT tomorrow.  It was great hearing her voice and knowing she is doing ok.

## 2020-11-24 ENCOUNTER — Ambulatory Visit
Admission: RE | Admit: 2020-11-24 | Discharge: 2020-11-24 | Disposition: A | Discharge status: Home / Self Care | Payer: Medicare HMO | Source: Ambulatory Visit | Attending: Radiation Oncology | Admitting: Radiation Oncology

## 2020-11-24 ENCOUNTER — Other Ambulatory Visit: Payer: Self-pay

## 2020-11-24 DIAGNOSIS — C3491 Malignant neoplasm of unspecified part of right bronchus or lung: Secondary | ICD-10-CM

## 2020-11-24 DIAGNOSIS — C3411 Malignant neoplasm of upper lobe, right bronchus or lung: Secondary | ICD-10-CM | POA: Diagnosis not present

## 2020-11-25 ENCOUNTER — Other Ambulatory Visit: Payer: Self-pay

## 2020-11-25 ENCOUNTER — Ambulatory Visit
Admission: RE | Admit: 2020-11-25 | Discharge: 2020-11-25 | Disposition: A | Discharge status: Home / Self Care | Payer: Medicare HMO | Source: Ambulatory Visit | Attending: Radiation Oncology | Admitting: Radiation Oncology

## 2020-11-25 DIAGNOSIS — C3491 Malignant neoplasm of unspecified part of right bronchus or lung: Secondary | ICD-10-CM | POA: Diagnosis not present

## 2020-11-25 DIAGNOSIS — C3411 Malignant neoplasm of upper lobe, right bronchus or lung: Secondary | ICD-10-CM | POA: Diagnosis not present

## 2020-11-26 ENCOUNTER — Ambulatory Visit
Admission: RE | Admit: 2020-11-26 | Discharge: 2020-11-26 | Disposition: A | Discharge status: Home / Self Care | Payer: Medicare HMO | Source: Ambulatory Visit | Attending: Radiation Oncology | Admitting: Radiation Oncology

## 2020-11-26 DIAGNOSIS — C3411 Malignant neoplasm of upper lobe, right bronchus or lung: Secondary | ICD-10-CM | POA: Diagnosis not present

## 2020-11-26 DIAGNOSIS — C3491 Malignant neoplasm of unspecified part of right bronchus or lung: Secondary | ICD-10-CM | POA: Diagnosis not present

## 2020-11-29 ENCOUNTER — Ambulatory Visit
Admission: RE | Admit: 2020-11-29 | Discharge: 2020-11-29 | Disposition: A | Discharge status: Home / Self Care | Payer: Medicare HMO | Source: Ambulatory Visit | Attending: Radiation Oncology | Admitting: Radiation Oncology

## 2020-11-29 ENCOUNTER — Other Ambulatory Visit: Payer: Self-pay

## 2020-11-29 DIAGNOSIS — C3491 Malignant neoplasm of unspecified part of right bronchus or lung: Secondary | ICD-10-CM | POA: Diagnosis not present

## 2020-11-29 DIAGNOSIS — C3411 Malignant neoplasm of upper lobe, right bronchus or lung: Secondary | ICD-10-CM | POA: Diagnosis not present

## 2020-11-30 ENCOUNTER — Ambulatory Visit
Admission: RE | Admit: 2020-11-30 | Discharge: 2020-11-30 | Disposition: A | Discharge status: Home / Self Care | Payer: Medicare HMO | Source: Ambulatory Visit | Attending: Radiation Oncology | Admitting: Radiation Oncology

## 2020-11-30 DIAGNOSIS — C3491 Malignant neoplasm of unspecified part of right bronchus or lung: Secondary | ICD-10-CM

## 2020-11-30 DIAGNOSIS — C3411 Malignant neoplasm of upper lobe, right bronchus or lung: Secondary | ICD-10-CM | POA: Diagnosis not present

## 2020-11-30 MED ORDER — SONAFINE EX EMUL
1.0000 "application " | Freq: Once | CUTANEOUS | Status: AC
  Administered 2020-11-30: 1 via TOPICAL

## 2020-11-30 NOTE — Progress Notes (Signed)
Pt here for patient teaching.    Pt given Radiation and You booklet, skin care instructions, and Sonafine.    Reviewed areas of pertinence such as fatigue, hair loss, skin changes, throat changes, cough, and shortness of breath .   Pt able to give teach back of to pat skin and use unscented/gentle soap,apply Sonafine bid and avoid applying anything to skin within 4 hours of treatment.   Pt demonstrated understanding, of information given and will contact nursing with any questions or concerns.    [email or web address]

## 2020-12-01 ENCOUNTER — Ambulatory Visit
Admission: RE | Admit: 2020-12-01 | Discharge: 2020-12-01 | Disposition: A | Discharge status: Home / Self Care | Payer: Medicare HMO | Source: Ambulatory Visit | Attending: Radiation Oncology | Admitting: Radiation Oncology

## 2020-12-01 ENCOUNTER — Other Ambulatory Visit: Payer: Self-pay

## 2020-12-01 DIAGNOSIS — Z1211 Encounter for screening for malignant neoplasm of colon: Secondary | ICD-10-CM | POA: Diagnosis not present

## 2020-12-01 DIAGNOSIS — K5730 Diverticulosis of large intestine without perforation or abscess without bleeding: Secondary | ICD-10-CM | POA: Diagnosis not present

## 2020-12-01 DIAGNOSIS — D125 Benign neoplasm of sigmoid colon: Secondary | ICD-10-CM | POA: Diagnosis not present

## 2020-12-01 DIAGNOSIS — C3411 Malignant neoplasm of upper lobe, right bronchus or lung: Secondary | ICD-10-CM | POA: Diagnosis not present

## 2020-12-01 DIAGNOSIS — K635 Polyp of colon: Secondary | ICD-10-CM | POA: Diagnosis not present

## 2020-12-01 DIAGNOSIS — C3491 Malignant neoplasm of unspecified part of right bronchus or lung: Secondary | ICD-10-CM | POA: Diagnosis not present

## 2020-12-02 ENCOUNTER — Ambulatory Visit
Admission: RE | Admit: 2020-12-02 | Discharge: 2020-12-02 | Disposition: A | Discharge status: Home / Self Care | Payer: Medicare HMO | Source: Ambulatory Visit | Attending: Radiation Oncology | Admitting: Radiation Oncology

## 2020-12-02 DIAGNOSIS — C3411 Malignant neoplasm of upper lobe, right bronchus or lung: Secondary | ICD-10-CM | POA: Diagnosis not present

## 2020-12-02 DIAGNOSIS — C3491 Malignant neoplasm of unspecified part of right bronchus or lung: Secondary | ICD-10-CM | POA: Diagnosis not present

## 2020-12-03 ENCOUNTER — Ambulatory Visit
Admission: RE | Admit: 2020-12-03 | Discharge: 2020-12-03 | Disposition: A | Discharge status: Home / Self Care | Payer: Medicare HMO | Source: Ambulatory Visit | Attending: Radiation Oncology | Admitting: Radiation Oncology

## 2020-12-03 DIAGNOSIS — T466X5A Adverse effect of antihyperlipidemic and antiarteriosclerotic drugs, initial encounter: Secondary | ICD-10-CM | POA: Insufficient documentation

## 2020-12-03 DIAGNOSIS — C3411 Malignant neoplasm of upper lobe, right bronchus or lung: Secondary | ICD-10-CM | POA: Diagnosis not present

## 2020-12-03 DIAGNOSIS — C3491 Malignant neoplasm of unspecified part of right bronchus or lung: Secondary | ICD-10-CM | POA: Diagnosis not present

## 2020-12-03 DIAGNOSIS — G720 Drug-induced myopathy: Secondary | ICD-10-CM | POA: Insufficient documentation

## 2020-12-06 ENCOUNTER — Other Ambulatory Visit: Payer: Self-pay

## 2020-12-06 ENCOUNTER — Ambulatory Visit
Admission: RE | Admit: 2020-12-06 | Discharge: 2020-12-06 | Disposition: A | Discharge status: Home / Self Care | Payer: Medicare HMO | Source: Ambulatory Visit | Attending: Radiation Oncology | Admitting: Radiation Oncology

## 2020-12-06 DIAGNOSIS — C3491 Malignant neoplasm of unspecified part of right bronchus or lung: Secondary | ICD-10-CM | POA: Diagnosis not present

## 2020-12-06 DIAGNOSIS — C3411 Malignant neoplasm of upper lobe, right bronchus or lung: Secondary | ICD-10-CM | POA: Diagnosis not present

## 2020-12-07 ENCOUNTER — Ambulatory Visit
Admission: RE | Admit: 2020-12-07 | Discharge: 2020-12-07 | Disposition: A | Discharge status: Home / Self Care | Payer: Medicare HMO | Source: Ambulatory Visit | Attending: Radiation Oncology | Admitting: Radiation Oncology

## 2020-12-07 DIAGNOSIS — C3491 Malignant neoplasm of unspecified part of right bronchus or lung: Secondary | ICD-10-CM | POA: Diagnosis not present

## 2020-12-07 DIAGNOSIS — C3411 Malignant neoplasm of upper lobe, right bronchus or lung: Secondary | ICD-10-CM | POA: Diagnosis not present

## 2020-12-08 ENCOUNTER — Other Ambulatory Visit: Payer: Self-pay

## 2020-12-08 ENCOUNTER — Ambulatory Visit
Admission: RE | Admit: 2020-12-08 | Discharge: 2020-12-08 | Disposition: A | Discharge status: Home / Self Care | Payer: Medicare HMO | Source: Ambulatory Visit | Attending: Radiation Oncology | Admitting: Radiation Oncology

## 2020-12-08 ENCOUNTER — Other Ambulatory Visit: Payer: Self-pay | Admitting: Cardiology

## 2020-12-08 DIAGNOSIS — C3411 Malignant neoplasm of upper lobe, right bronchus or lung: Secondary | ICD-10-CM | POA: Diagnosis not present

## 2020-12-08 DIAGNOSIS — C3491 Malignant neoplasm of unspecified part of right bronchus or lung: Secondary | ICD-10-CM | POA: Diagnosis not present

## 2020-12-08 NOTE — Telephone Encounter (Signed)
This is Dr. Schumann's pt 

## 2020-12-09 ENCOUNTER — Ambulatory Visit
Admission: RE | Admit: 2020-12-09 | Discharge: 2020-12-09 | Disposition: A | Discharge status: Home / Self Care | Payer: Medicare HMO | Source: Ambulatory Visit | Attending: Radiation Oncology | Admitting: Radiation Oncology

## 2020-12-09 DIAGNOSIS — C3491 Malignant neoplasm of unspecified part of right bronchus or lung: Secondary | ICD-10-CM | POA: Diagnosis not present

## 2020-12-09 DIAGNOSIS — C3411 Malignant neoplasm of upper lobe, right bronchus or lung: Secondary | ICD-10-CM | POA: Diagnosis not present

## 2020-12-10 ENCOUNTER — Ambulatory Visit
Admission: RE | Admit: 2020-12-10 | Discharge: 2020-12-10 | Disposition: A | Discharge status: Home / Self Care | Payer: Medicare HMO | Source: Ambulatory Visit | Attending: Radiation Oncology | Admitting: Radiation Oncology

## 2020-12-10 ENCOUNTER — Other Ambulatory Visit: Payer: Self-pay

## 2020-12-10 DIAGNOSIS — C3411 Malignant neoplasm of upper lobe, right bronchus or lung: Secondary | ICD-10-CM | POA: Diagnosis not present

## 2020-12-10 DIAGNOSIS — C3491 Malignant neoplasm of unspecified part of right bronchus or lung: Secondary | ICD-10-CM | POA: Diagnosis not present

## 2020-12-13 ENCOUNTER — Encounter: Payer: Self-pay | Admitting: Internal Medicine

## 2020-12-13 ENCOUNTER — Other Ambulatory Visit: Payer: Self-pay

## 2020-12-13 ENCOUNTER — Ambulatory Visit
Admission: RE | Admit: 2020-12-13 | Discharge: 2020-12-13 | Disposition: A | Discharge status: Home / Self Care | Payer: Medicare HMO | Source: Ambulatory Visit | Attending: Radiation Oncology | Admitting: Radiation Oncology

## 2020-12-13 DIAGNOSIS — C3491 Malignant neoplasm of unspecified part of right bronchus or lung: Secondary | ICD-10-CM | POA: Diagnosis not present

## 2020-12-13 DIAGNOSIS — C3411 Malignant neoplasm of upper lobe, right bronchus or lung: Secondary | ICD-10-CM | POA: Diagnosis not present

## 2020-12-14 ENCOUNTER — Ambulatory Visit
Admission: RE | Admit: 2020-12-14 | Discharge: 2020-12-14 | Disposition: A | Discharge status: Home / Self Care | Payer: Medicare HMO | Source: Ambulatory Visit | Attending: Radiation Oncology | Admitting: Radiation Oncology

## 2020-12-14 ENCOUNTER — Encounter: Payer: Self-pay | Admitting: Medical Oncology

## 2020-12-14 DIAGNOSIS — C3491 Malignant neoplasm of unspecified part of right bronchus or lung: Secondary | ICD-10-CM | POA: Diagnosis not present

## 2020-12-14 DIAGNOSIS — C3411 Malignant neoplasm of upper lobe, right bronchus or lung: Secondary | ICD-10-CM | POA: Diagnosis not present

## 2020-12-15 ENCOUNTER — Encounter: Payer: Self-pay | Admitting: Medical Oncology

## 2020-12-15 ENCOUNTER — Ambulatory Visit
Admission: RE | Admit: 2020-12-15 | Discharge: 2020-12-15 | Disposition: A | Discharge status: Home / Self Care | Payer: Medicare HMO | Source: Ambulatory Visit | Attending: Radiation Oncology | Admitting: Radiation Oncology

## 2020-12-15 ENCOUNTER — Other Ambulatory Visit: Payer: Self-pay | Admitting: Medical Oncology

## 2020-12-15 ENCOUNTER — Other Ambulatory Visit: Payer: Self-pay

## 2020-12-15 DIAGNOSIS — C3411 Malignant neoplasm of upper lobe, right bronchus or lung: Secondary | ICD-10-CM | POA: Diagnosis not present

## 2020-12-15 DIAGNOSIS — C3491 Malignant neoplasm of unspecified part of right bronchus or lung: Secondary | ICD-10-CM | POA: Diagnosis not present

## 2020-12-15 NOTE — Progress Notes (Signed)
Referral sent to [facility]. Demographics / insuranceand progress note faxed.

## 2020-12-16 ENCOUNTER — Telehealth: Payer: Self-pay | Admitting: Radiology

## 2020-12-16 ENCOUNTER — Other Ambulatory Visit: Payer: Self-pay | Admitting: Radiation Oncology

## 2020-12-16 ENCOUNTER — Ambulatory Visit
Admission: RE | Admit: 2020-12-16 | Discharge: 2020-12-16 | Disposition: A | Discharge status: Home / Self Care | Payer: Medicare HMO | Source: Ambulatory Visit | Attending: Radiation Oncology | Admitting: Radiation Oncology

## 2020-12-16 DIAGNOSIS — C3411 Malignant neoplasm of upper lobe, right bronchus or lung: Secondary | ICD-10-CM | POA: Diagnosis not present

## 2020-12-16 DIAGNOSIS — C3491 Malignant neoplasm of unspecified part of right bronchus or lung: Secondary | ICD-10-CM | POA: Diagnosis not present

## 2020-12-16 MED ORDER — SUCRALFATE 1 G PO TABS: 1.0000 g | ORAL_TABLET | Freq: Three times a day (TID) | ORAL | 1 refills | Status: DC

## 2020-12-16 NOTE — Telephone Encounter (Signed)
Patient reports a lump in her throat at all times that is affecting eating and swallowing. She asks for script to be sent to [facility] on [address].

## 2020-12-17 ENCOUNTER — Other Ambulatory Visit: Payer: Self-pay

## 2020-12-17 ENCOUNTER — Ambulatory Visit
Admission: RE | Admit: 2020-12-17 | Discharge: 2020-12-17 | Disposition: A | Discharge status: Home / Self Care | Payer: Medicare HMO | Source: Ambulatory Visit | Attending: Radiation Oncology | Admitting: Radiation Oncology

## 2020-12-17 DIAGNOSIS — C3491 Malignant neoplasm of unspecified part of right bronchus or lung: Secondary | ICD-10-CM | POA: Diagnosis present

## 2020-12-17 DIAGNOSIS — C3411 Malignant neoplasm of upper lobe, right bronchus or lung: Secondary | ICD-10-CM | POA: Diagnosis not present

## 2020-12-21 ENCOUNTER — Other Ambulatory Visit: Payer: Self-pay

## 2020-12-21 ENCOUNTER — Ambulatory Visit
Admission: RE | Admit: 2020-12-21 | Discharge: 2020-12-21 | Disposition: A | Discharge status: Home / Self Care | Payer: Medicare HMO | Source: Ambulatory Visit | Attending: Radiation Oncology | Admitting: Radiation Oncology

## 2020-12-21 ENCOUNTER — Other Ambulatory Visit: Payer: Self-pay | Admitting: Radiation Oncology

## 2020-12-21 DIAGNOSIS — C3411 Malignant neoplasm of upper lobe, right bronchus or lung: Secondary | ICD-10-CM | POA: Diagnosis not present

## 2020-12-21 DIAGNOSIS — C3491 Malignant neoplasm of unspecified part of right bronchus or lung: Secondary | ICD-10-CM | POA: Diagnosis not present

## 2020-12-21 MED ORDER — LIDOCAINE VISCOUS HCL 2 % MT SOLN: 10.0000 mL | OROMUCOSAL | 0 refills | Status: DC | PRN

## 2020-12-22 ENCOUNTER — Ambulatory Visit
Admission: RE | Admit: 2020-12-22 | Discharge: 2020-12-22 | Disposition: A | Discharge status: Home / Self Care | Payer: Medicare HMO | Source: Ambulatory Visit | Attending: Radiation Oncology | Admitting: Radiation Oncology

## 2020-12-22 DIAGNOSIS — C3491 Malignant neoplasm of unspecified part of right bronchus or lung: Secondary | ICD-10-CM | POA: Diagnosis not present

## 2020-12-22 DIAGNOSIS — M79671 Pain in right foot: Secondary | ICD-10-CM | POA: Diagnosis not present

## 2020-12-22 DIAGNOSIS — C3411 Malignant neoplasm of upper lobe, right bronchus or lung: Secondary | ICD-10-CM | POA: Diagnosis not present

## 2020-12-22 DIAGNOSIS — M79672 Pain in left foot: Secondary | ICD-10-CM | POA: Diagnosis not present

## 2020-12-22 DIAGNOSIS — M722 Plantar fascial fibromatosis: Secondary | ICD-10-CM | POA: Diagnosis not present

## 2020-12-23 ENCOUNTER — Other Ambulatory Visit: Payer: Self-pay

## 2020-12-23 ENCOUNTER — Ambulatory Visit: Payer: Medicare HMO | Admitting: Family Medicine

## 2020-12-23 ENCOUNTER — Ambulatory Visit
Admission: RE | Admit: 2020-12-23 | Discharge: 2020-12-23 | Disposition: A | Discharge status: Home / Self Care | Payer: Medicare HMO | Source: Ambulatory Visit | Attending: Radiation Oncology | Admitting: Radiation Oncology

## 2020-12-23 ENCOUNTER — Ambulatory Visit: Payer: Self-pay | Admitting: Podiatry

## 2020-12-23 DIAGNOSIS — C3491 Malignant neoplasm of unspecified part of right bronchus or lung: Secondary | ICD-10-CM | POA: Diagnosis not present

## 2020-12-23 DIAGNOSIS — C3411 Malignant neoplasm of upper lobe, right bronchus or lung: Secondary | ICD-10-CM | POA: Diagnosis not present

## 2020-12-24 ENCOUNTER — Ambulatory Visit
Admission: RE | Admit: 2020-12-24 | Discharge: 2020-12-24 | Disposition: A | Discharge status: Home / Self Care | Payer: Medicare HMO | Source: Ambulatory Visit | Attending: Radiation Oncology | Admitting: Radiation Oncology

## 2020-12-24 DIAGNOSIS — C3491 Malignant neoplasm of unspecified part of right bronchus or lung: Secondary | ICD-10-CM | POA: Diagnosis not present

## 2020-12-24 DIAGNOSIS — C3411 Malignant neoplasm of upper lobe, right bronchus or lung: Secondary | ICD-10-CM | POA: Diagnosis not present

## 2020-12-27 ENCOUNTER — Other Ambulatory Visit: Payer: Self-pay

## 2020-12-27 ENCOUNTER — Inpatient Hospital Stay: Payer: Medicare HMO | Attending: Internal Medicine | Admitting: Internal Medicine

## 2020-12-27 ENCOUNTER — Inpatient Hospital Stay: Payer: Medicare HMO

## 2020-12-27 ENCOUNTER — Ambulatory Visit
Admission: RE | Admit: 2020-12-27 | Discharge: 2020-12-27 | Disposition: A | Discharge status: Home / Self Care | Payer: Medicare HMO | Source: Ambulatory Visit | Attending: Radiation Oncology | Admitting: Radiation Oncology

## 2020-12-27 VITALS — BP 146/78 | HR 80 | Temp 97.6°F | Resp 18 | Wt 129.4 lb

## 2020-12-27 DIAGNOSIS — Z79899 Other long term (current) drug therapy: Secondary | ICD-10-CM | POA: Insufficient documentation

## 2020-12-27 DIAGNOSIS — F32A Depression, unspecified: Secondary | ICD-10-CM | POA: Insufficient documentation

## 2020-12-27 DIAGNOSIS — I7300 Raynaud's syndrome without gangrene: Secondary | ICD-10-CM | POA: Insufficient documentation

## 2020-12-27 DIAGNOSIS — Z7982 Long term (current) use of aspirin: Secondary | ICD-10-CM | POA: Diagnosis not present

## 2020-12-27 DIAGNOSIS — C3491 Malignant neoplasm of unspecified part of right bronchus or lung: Secondary | ICD-10-CM

## 2020-12-27 DIAGNOSIS — C3411 Malignant neoplasm of upper lobe, right bronchus or lung: Secondary | ICD-10-CM | POA: Insufficient documentation

## 2020-12-27 DIAGNOSIS — R110 Nausea: Secondary | ICD-10-CM | POA: Insufficient documentation

## 2020-12-27 DIAGNOSIS — C3490 Malignant neoplasm of unspecified part of unspecified bronchus or lung: Secondary | ICD-10-CM

## 2020-12-27 DIAGNOSIS — I10 Essential (primary) hypertension: Secondary | ICD-10-CM | POA: Insufficient documentation

## 2020-12-27 DIAGNOSIS — Z5111 Encounter for antineoplastic chemotherapy: Secondary | ICD-10-CM

## 2020-12-27 DIAGNOSIS — J910 Malignant pleural effusion: Secondary | ICD-10-CM | POA: Diagnosis not present

## 2020-12-27 DIAGNOSIS — K219 Gastro-esophageal reflux disease without esophagitis: Secondary | ICD-10-CM | POA: Diagnosis not present

## 2020-12-27 DIAGNOSIS — M722 Plantar fascial fibromatosis: Secondary | ICD-10-CM | POA: Diagnosis not present

## 2020-12-27 DIAGNOSIS — R197 Diarrhea, unspecified: Secondary | ICD-10-CM | POA: Diagnosis not present

## 2020-12-27 DIAGNOSIS — R69 Illness, unspecified: Secondary | ICD-10-CM | POA: Diagnosis not present

## 2020-12-27 DIAGNOSIS — R059 Cough, unspecified: Secondary | ICD-10-CM | POA: Diagnosis not present

## 2020-12-27 LAB — CBC WITH DIFFERENTIAL (CANCER CENTER ONLY)
Abs Immature Granulocytes: 0.01 10*3/uL (ref 0.00–0.07)
Basophils Absolute: 0 10*3/uL (ref 0.0–0.1)
Basophils Relative: 1 %
Eosinophils Absolute: 0.1 10*3/uL (ref 0.0–0.5)
Eosinophils Relative: 2 %
HCT: 35.5 % — ABNORMAL LOW (ref 36.0–46.0)
Hemoglobin: 11.4 g/dL — ABNORMAL LOW (ref 12.0–15.0)
Immature Granulocytes: 0 %
Lymphocytes Relative: 7 %
Lymphs Abs: 0.3 10*3/uL — ABNORMAL LOW (ref 0.7–4.0)
MCH: 26 pg (ref 26.0–34.0)
MCHC: 32.1 g/dL (ref 30.0–36.0)
MCV: 81.1 fL (ref 80.0–100.0)
Monocytes Absolute: 0.6 10*3/uL (ref 0.1–1.0)
Monocytes Relative: 16 %
Neutro Abs: 2.7 10*3/uL (ref 1.7–7.7)
Neutrophils Relative %: 74 %
Platelet Count: 206 10*3/uL (ref 150–400)
RBC: 4.38 MIL/uL (ref 3.87–5.11)
RDW: 13.7 % (ref 11.5–15.5)
WBC Count: 3.7 10*3/uL — ABNORMAL LOW (ref 4.0–10.5)
nRBC: 0 % (ref 0.0–0.2)

## 2020-12-27 LAB — CMP (CANCER CENTER ONLY)
ALT: 17 U/L (ref 0–44)
AST: 28 U/L (ref 15–41)
Albumin: 3.6 g/dL (ref 3.5–5.0)
Alkaline Phosphatase: 55 U/L (ref 38–126)
Anion gap: 9 (ref 5–15)
BUN: 11 mg/dL (ref 8–23)
CO2: 31 mmol/L (ref 22–32)
Calcium: 9.8 mg/dL (ref 8.9–10.3)
Chloride: 100 mmol/L (ref 98–111)
Creatinine: 0.86 mg/dL (ref 0.44–1.00)
GFR, Estimated: >60 mL/min (ref >60)
Glucose, Bld: 81 mg/dL (ref 70–99)
Potassium: 3.7 mmol/L (ref 3.5–5.1)
Sodium: 140 mmol/L (ref 135–145)
Total Bilirubin: 0.3 mg/dL (ref 0.3–1.2)
Total Protein: 7.4 g/dL (ref 6.5–8.1)

## 2020-12-27 MED ORDER — PROCHLORPERAZINE MALEATE 10 MG PO TABS: 10.0000 mg | ORAL_TABLET | Freq: Four times a day (QID) | ORAL | 0 refills | Status: DC | PRN

## 2020-12-27 NOTE — Addendum Note (Signed)
Addended by: Claretha Cooper on: 12/27/2020 04:47 PM   Modules accepted: Orders

## 2020-12-27 NOTE — Progress Notes (Signed)
[facility] [phone]   [phone]  OFFICE PROGRESS NOTE  [name], [place] [address]  DIAGNOSIS: Stage IV (T2 a,N2, M1a) non-small cell lung cancer, adenocarcinoma diagnosed in July 2019 and presented with right upper lobe lung mass in addition to mediastinal lymphadenopathy as well as bilateral pulmonary nodules and malignant right pleural effusion.  Biomarker Findings Microsatellite status - Cannot Be Determined Tumor Mutational Burden - Cannot Be Determined Genomic Findings For a complete list of the genes assayed, please refer to the Appendix. EGFR exon 19 deletion ([id number]) TP53 Y220C 7 Disease relevant genes with no reportable alterations: KRAS, ALK, BRAF, MET, RET, ERBB2, ROS1   PRIOR THERAPY:  1) Status post right Pleurx catheter placement by Dr. Prescott Gum for drainage of malignant right pleural effusion. 2) palliative radiotherapy to the enlarging right upper lobe lung mass and mediastinum under the care of Dr. Sondra Come expected to be completed on January 05, 2021.  CURRENT THERAPY: Tagrisso 80 mg p.o. daily.  First dose was given on 01/29/2018.  Status post 35 months of treatment.  INTERVAL HISTORY: Allison [name] 70 y.o. female returns to the clinic today for follow-up visit accompanied by her boyfriend and her daughter was available by phone during the visit.  The patient is currently undergoing a course of systemic chemotherapy to the enlarging right upper lobe lung mass and mediastinum under the care of Dr. Sondra Come.  She is expected to complete this course of radiotherapy next week.  She has pain and the feet and she was referred to orthopedic surgery and currently undergoing treatment for plantar fasciitis.  She denied having any chest pain but continues to have dry cough and she is currently on Tessalon Perles in addition to Delsym.  She also has occasional bloating with acid reflux.  She  is currently on Carafate and Nexium.  She denied having any fever or chills.  She has no current nausea, vomiting, diarrhea or constipation.  She has no headache or visual changes.  She is here today for evaluation and repeat blood work.  MEDICAL HISTORY: Past Medical History:  Diagnosis Date   Anxiety    Arthritis    Asthma    exercise induced   Depression    PMH   GERD (gastroesophageal reflux disease)    Glaucoma    Hypertension    lung ca dx'd 11/2017   right   Malignant pleural effusion    right   PONV (postoperative nausea and vomiting)    Pre-diabetes    Raynaud's disease    Raynaud's disease     ALLERGIES:  is allergic to penicillins, vicodin [hydrocodone-acetaminophen], and other.  MEDICATIONS:  Current Outpatient Medications  Medication Sig Dispense Refill   acetaminophen (TYLENOL) 500 MG tablet Take 1,000 mg by mouth every 6 (six) hours as needed (pain).  (Patient not taking: Reported on 11/11/2020)     Acetylcysteine (N-ACETYL-L-CYSTEINE PO) Take 1 tablet by mouth once. (Patient not taking: Reported on 11/11/2020)     ALPRAZolam (XANAX) 0.25 MG tablet Take 0.25 mg by mouth 2 (two) times daily as needed for anxiety.      amLODipine (NORVASC) 10 MG tablet Take 1 tablet (10 mg total) by mouth daily. 90 tablet 3   aspirin 81 MG chewable tablet Chew 1 tablet (81 mg total) by mouth daily.     azithromycin (ZITHROMAX) 250 MG tablet Take 2 tabs PO day 1, then take 1 tab  PO once daily 6 tablet 0   b complex vitamins capsule Take 1 capsule by mouth daily.     benzonatate (TESSALON) 100 MG capsule Take by mouth 3 (three) times daily as needed for cough.     Biotin 1000 MCG CHEW Chew 10 mg by mouth daily. (Patient not taking: Reported on 11/11/2020)     carvedilol (COREG) 6.25 MG tablet TAKE 1 TABLET BY MOUTH 2 TIMES DAILY WITH A MEAL. PLEASE SCHEDULE APPOINTMENT FOR FUTURE REFILLS. 180 tablet 1   cetirizine (ZYRTEC) 5 MG tablet Take 5 mg by mouth daily.     Dextromethorphan HBr  (DELSYM PO) Take by mouth as needed.     dorzolamide-timolol (COSOPT) 22.3-6.8 MG/ML ophthalmic solution Instill 1 drop in both eyes twice daily     estradiol (ESTRACE) 0.1 MG/GM vaginal cream Place 1 Applicatorful vaginally 2 (two) times a week. (Patient not taking: Reported on 11/11/2020)     Evolocumab (REPATHA SURECLICK) 784 MG/ML SOAJ Inject 1 pen into the skin every 14 (fourteen) days. 2 mL 11   ezetimibe (ZETIA) 10 MG tablet Take 10 mg by mouth daily.      fluticasone (FLONASE) 50 MCG/ACT nasal spray Place 2 sprays into both nostrils daily. 16 g 6   hydrochlorothiazide (HYDRODIURIL) 25 MG tablet Take 25 mg by mouth daily. Currently taking 2 of the 12.51m daily     latanoprost (XALATAN) 0.005 % ophthalmic solution Place 1 drop into both eyes at bedtime.      lidocaine (XYLOCAINE) 2 % solution Use as directed 10 mLs in the mouth or throat as needed for mouth pain. Mix 1 part 2% viscous lidocaine, and 1 part water. Swish and swallow 10 mL of diluted mixture, 30 minutes before meals and at bedtime (up to QID). 100 mL 0   osimertinib mesylate (TAGRISSO) 80 MG tablet Take 1 tablet (80 mg total) by mouth daily. 30 tablet 3   OVER THE COUNTER MEDICATION Collagen and Peptides (Patient not taking: Reported on 11/11/2020)     Probiotic Product (PROBIOTIC-10 PO) Take by mouth. (Patient not taking: Reported on 11/11/2020)     RESTASIS 0.05 % ophthalmic emulsion 1 drop 2 (two) times daily.     sucralfate (CARAFATE) 1 g tablet Take 1 tablet (1 g total) by mouth 4 (four) times daily -  with meals and at bedtime. Crush and dissolve in 10 mL of warm water prior to swallowing 120 tablet 1   testosterone cypionate (DEPOTESTOSTERONE CYPIONATE) 200 MG/ML injection Depo-Testosterone 200 mg/mL intramuscular oil  Given:    GM (Patient not taking: Reported on 11/11/2020)     VITAMIN D-VITAMIN K PO Take 1 tablet by mouth daily.     No current facility-administered medications for this visit.    SURGICAL HISTORY:  Past  Surgical History:  Procedure Laterality Date   ABDOMINAL HYSTERECTOMY     partial   CHEST TUBE INSERTION Right 01/01/2018   Procedure: INSERTION PLEURAL DRAINAGE CATHETER;  Surgeon: VIvin Poot MD;  [facility]  Service: Thoracic;  Laterality: Right;   COLONOSCOPY     CORONARY STENT INTERVENTION N/A 06/16/2019   Procedure: CORONARY STENT INTERVENTION;  Surgeon: VJettie Booze MD;  [facility];  Service: Cardiovascular;  Laterality: N/A;   DILATION AND CURETTAGE OF UTERUS     EYE SURGERY     due to Glaucoma   LEFT HEART CATH AND CORONARY ANGIOGRAPHY N/A 06/16/2019   Procedure: LEFT HEART CATH AND CORONARY ANGIOGRAPHY;  Surgeon: VIrish Lack  Charlann Lange, MD;  Location: [facility];  Service: Cardiovascular;  Laterality: N/A;   REMOVAL OF PLEURAL DRAINAGE CATHETER Right 11/07/2018   Procedure: REMOVAL OF PLEURAL DRAINAGE CATHETER;  Surgeon: Ivin Poot, MD;  Location: [facility];  Service: Thoracic;  Laterality: Right;   ROTATOR CUFF REPAIR     TUBAL LIGATION     WISDOM TOOTH EXTRACTION      REVIEW OF SYSTEMS:  Constitutional: positive for fatigue Eyes: negative Ears, nose, mouth, throat, and face: negative Respiratory: positive for cough Cardiovascular: negative Gastrointestinal: positive for diarrhea, nausea, and reflux symptoms Genitourinary:negative Integument/breast: negative Hematologic/lymphatic: negative Musculoskeletal:positive for arthralgias Neurological: negative Behavioral/Psych: negative Endocrine: negative Allergic/Immunologic: negative   PHYSICAL EXAMINATION: General appearance: alert, cooperative, fatigued, and no distress Head: Normocephalic, without obvious abnormality, atraumatic Neck: no adenopathy, no JVD, supple, symmetrical, trachea midline, and thyroid not enlarged, symmetric, no tenderness/mass/nodules Lymph nodes: Cervical, supraclavicular, and axillary nodes normal. Resp: clear to auscultation bilaterally Back:  symmetric, no curvature. ROM normal. No CVA tenderness. Cardio: regular rate and rhythm, S1, S2 normal, no murmur, click, rub or gallop GI: soft, non-tender; bowel sounds normal; no masses,  no organomegaly Extremities: extremities normal, atraumatic, no cyanosis or edema Neurologic: Alert and oriented X 3, normal strength and tone. Normal symmetric reflexes. Normal coordination and gait  ECOG PERFORMANCE STATUS: 1 - Symptomatic but completely ambulatory  Blood pressure (!) 146/78, pulse 80, temperature 97.6 F (36.4 C), temperature source Tympanic, resp. rate 18, weight 129 lb 6 oz (58.7 kg), SpO2 100 %.  LABORATORY DATA: Lab Results  Component Value Date   WBC 3.7 (L) 12/27/2020   HGB 11.4 (L) 12/27/2020   HCT 35.5 (L) 12/27/2020   MCV 81.1 12/27/2020   PLT 206 12/27/2020      Chemistry      Component Value Date/Time   NA 140 12/27/2020 1440   NA 140 09/13/2020 0847   K 3.7 12/27/2020 1440   CL 100 12/27/2020 1440   CO2 31 12/27/2020 1440   BUN 11 12/27/2020 1440   BUN 10 09/13/2020 0847   CREATININE 0.86 12/27/2020 1440      Component Value Date/Time   CALCIUM 9.8 12/27/2020 1440   ALKPHOS 55 12/27/2020 1440   AST 28 12/27/2020 1440   ALT 17 12/27/2020 1440   BILITOT 0.3 12/27/2020 1440       RADIOGRAPHIC STUDIES: No results found.   ASSESSMENT AND PLAN: This is a very pleasant 70 years old never smoker African-American female recently with a stage IV non-small cell lung cancer, adenocarcinoma with positive EGFR mutation with deletion in exon 19. The patient was started on treatment with Tagrisso 80 mg p.o. daily status post 35 months of treatment. She has been tolerating this treatment well with no concerning adverse effects except for intermittent diarrhea. She had repeat CT scan of the chest, abdomen pelvis performed recently.  I personally and independently reviewed the scans and discussed the results with the patient and her boyfriend today. Unfortunately  the CT scan showed interval progression of the right apical lung mass in addition to progression of mediastinal lymphadenopathy concerning for worsening of her disease. She has no actionable resistant mutation on the molecular studies performed by [facility]. The patient continued her current treatment with Tagrisso and tolerating it fairly well. She is also undergoing palliative radiotherapy to the enlarging right upper lobe lung mass in addition to the mediastinal lymphadenopathy under the care of Dr. Sondra Come and expected to complete this course next week.  The patient had several questions today about her condition and management of other symptoms like the plantar fasciitis which is currently managed by specialist.  She was advised by her primary care physician to use other alternative medicine but I was not sure about any interaction with Tagrisso and I advised the patient to avoid it if possible. For the cough she will continue on Tessalon Perles.  The patient is allergic to codeine and she cannot use Hycodan or Tussionex. I will see her back for follow-up visit in 6 weeks for evaluation with repeat CT scan of the chest, abdomen pelvis for restaging of her disease after completion of the radiotherapy. She was advised to call immediately if she has any other concerning symptoms in the interval. The patient voices understanding of current disease status and treatment options and is in agreement with the current care plan. All questions were answered. The patient knows to call the clinic with any problems, questions or concerns. We can certainly see the patient much sooner if necessary. The total time spent in the appointment was 45 minutes.  Disclaimer: This note was dictated with voice recognition software. Similar sounding words can inadvertently be transcribed and may not be corrected upon review.

## 2020-12-28 ENCOUNTER — Ambulatory Visit
Admission: RE | Admit: 2020-12-28 | Discharge: 2020-12-28 | Disposition: A | Discharge status: Home / Self Care | Payer: Medicare HMO | Source: Ambulatory Visit | Attending: Radiation Oncology | Admitting: Radiation Oncology

## 2020-12-28 DIAGNOSIS — C3491 Malignant neoplasm of unspecified part of right bronchus or lung: Secondary | ICD-10-CM | POA: Diagnosis not present

## 2020-12-28 DIAGNOSIS — C3411 Malignant neoplasm of upper lobe, right bronchus or lung: Secondary | ICD-10-CM | POA: Diagnosis not present

## 2020-12-29 ENCOUNTER — Ambulatory Visit
Admission: RE | Admit: 2020-12-29 | Discharge: 2020-12-29 | Disposition: A | Discharge status: Home / Self Care | Payer: Medicare HMO | Source: Ambulatory Visit | Attending: Radiation Oncology | Admitting: Radiation Oncology

## 2020-12-29 ENCOUNTER — Other Ambulatory Visit: Payer: Self-pay

## 2020-12-29 DIAGNOSIS — C3411 Malignant neoplasm of upper lobe, right bronchus or lung: Secondary | ICD-10-CM | POA: Diagnosis not present

## 2020-12-29 DIAGNOSIS — C3491 Malignant neoplasm of unspecified part of right bronchus or lung: Secondary | ICD-10-CM | POA: Diagnosis not present

## 2020-12-30 ENCOUNTER — Ambulatory Visit
Admission: RE | Admit: 2020-12-30 | Discharge: 2020-12-30 | Disposition: A | Discharge status: Home / Self Care | Payer: Medicare HMO | Source: Ambulatory Visit | Attending: Radiation Oncology | Admitting: Radiation Oncology

## 2020-12-30 DIAGNOSIS — C3491 Malignant neoplasm of unspecified part of right bronchus or lung: Secondary | ICD-10-CM | POA: Diagnosis not present

## 2020-12-30 DIAGNOSIS — C3411 Malignant neoplasm of upper lobe, right bronchus or lung: Secondary | ICD-10-CM | POA: Diagnosis not present

## 2020-12-31 ENCOUNTER — Ambulatory Visit
Admission: RE | Admit: 2020-12-31 | Discharge: 2020-12-31 | Disposition: A | Discharge status: Home / Self Care | Payer: Medicare HMO | Source: Ambulatory Visit | Attending: Radiation Oncology | Admitting: Radiation Oncology

## 2020-12-31 ENCOUNTER — Other Ambulatory Visit: Payer: Self-pay

## 2020-12-31 ENCOUNTER — Encounter: Payer: Self-pay | Admitting: Radiology

## 2020-12-31 DIAGNOSIS — C3491 Malignant neoplasm of unspecified part of right bronchus or lung: Secondary | ICD-10-CM | POA: Diagnosis not present

## 2020-12-31 DIAGNOSIS — C3411 Malignant neoplasm of upper lobe, right bronchus or lung: Secondary | ICD-10-CM | POA: Diagnosis not present

## 2021-01-03 ENCOUNTER — Other Ambulatory Visit: Payer: Self-pay

## 2021-01-03 ENCOUNTER — Ambulatory Visit
Admission: RE | Admit: 2021-01-03 | Discharge: 2021-01-03 | Disposition: A | Discharge status: Home / Self Care | Payer: Medicare HMO | Source: Ambulatory Visit | Attending: Radiation Oncology | Admitting: Radiation Oncology

## 2021-01-03 DIAGNOSIS — C3491 Malignant neoplasm of unspecified part of right bronchus or lung: Secondary | ICD-10-CM | POA: Diagnosis not present

## 2021-01-03 DIAGNOSIS — C3411 Malignant neoplasm of upper lobe, right bronchus or lung: Secondary | ICD-10-CM | POA: Diagnosis not present

## 2021-01-04 ENCOUNTER — Ambulatory Visit
Admission: RE | Admit: 2021-01-04 | Discharge: 2021-01-04 | Disposition: A | Discharge status: Home / Self Care | Payer: Medicare HMO | Source: Ambulatory Visit | Attending: Radiation Oncology | Admitting: Radiation Oncology

## 2021-01-04 ENCOUNTER — Encounter: Payer: Self-pay | Admitting: Radiation Oncology

## 2021-01-04 DIAGNOSIS — C3491 Malignant neoplasm of unspecified part of right bronchus or lung: Secondary | ICD-10-CM | POA: Diagnosis not present

## 2021-01-04 DIAGNOSIS — C3411 Malignant neoplasm of upper lobe, right bronchus or lung: Secondary | ICD-10-CM | POA: Diagnosis not present

## 2021-01-05 ENCOUNTER — Ambulatory Visit
Admission: RE | Admit: 2021-01-05 | Discharge: 2021-01-05 | Disposition: A | Discharge status: Home / Self Care | Payer: Medicare HMO | Source: Ambulatory Visit | Attending: Radiation Oncology | Admitting: Radiation Oncology

## 2021-01-05 ENCOUNTER — Other Ambulatory Visit: Payer: Self-pay

## 2021-01-05 DIAGNOSIS — C3491 Malignant neoplasm of unspecified part of right bronchus or lung: Secondary | ICD-10-CM | POA: Diagnosis not present

## 2021-01-05 DIAGNOSIS — C3411 Malignant neoplasm of upper lobe, right bronchus or lung: Secondary | ICD-10-CM | POA: Diagnosis not present

## 2021-01-05 DIAGNOSIS — Z923 Personal history of irradiation: Secondary | ICD-10-CM

## 2021-01-05 HISTORY — DX: Personal history of irradiation: Z92.3

## 2021-01-12 DIAGNOSIS — E785 Hyperlipidemia, unspecified: Secondary | ICD-10-CM | POA: Diagnosis not present

## 2021-01-12 DIAGNOSIS — C3490 Malignant neoplasm of unspecified part of unspecified bronchus or lung: Secondary | ICD-10-CM | POA: Diagnosis not present

## 2021-01-12 DIAGNOSIS — K219 Gastro-esophageal reflux disease without esophagitis: Secondary | ICD-10-CM | POA: Diagnosis not present

## 2021-01-12 DIAGNOSIS — I10 Essential (primary) hypertension: Secondary | ICD-10-CM | POA: Diagnosis not present

## 2021-01-12 DIAGNOSIS — Z7982 Long term (current) use of aspirin: Secondary | ICD-10-CM | POA: Diagnosis not present

## 2021-01-12 DIAGNOSIS — H409 Unspecified glaucoma: Secondary | ICD-10-CM | POA: Diagnosis not present

## 2021-01-12 DIAGNOSIS — I2510 Atherosclerotic heart disease of native coronary artery without angina pectoris: Secondary | ICD-10-CM | POA: Diagnosis not present

## 2021-01-12 DIAGNOSIS — R059 Cough, unspecified: Secondary | ICD-10-CM | POA: Diagnosis not present

## 2021-01-12 DIAGNOSIS — I252 Old myocardial infarction: Secondary | ICD-10-CM | POA: Diagnosis not present

## 2021-01-12 DIAGNOSIS — C779 Secondary and unspecified malignant neoplasm of lymph node, unspecified: Secondary | ICD-10-CM | POA: Diagnosis not present

## 2021-01-13 ENCOUNTER — Encounter: Payer: Self-pay | Admitting: Radiation Oncology

## 2021-01-17 DIAGNOSIS — I639 Cerebral infarction, unspecified: Secondary | ICD-10-CM

## 2021-01-17 HISTORY — DX: Cerebral infarction, unspecified: I63.9

## 2021-01-24 ENCOUNTER — Telehealth: Payer: Self-pay | Admitting: Radiology

## 2021-01-24 NOTE — Telephone Encounter (Signed)
Patient has a cough that has persisted since finishing treatment to right lung that she is concerned about. She reports taking a steroid for poison ivy shortly after completion of treatment and cough and shortness of breath improved. Since completing steroid, she is now having body aches from coughing. It is a dry cough. Tessalon pearls, Robitussin DM and honey have not improved symptoms. She has moderate shortness of breath while talking and with activity only during the day. This stops at night. No hemoptysis. Appetite also improved while taking steroids but is now poor. Please advise.

## 2021-01-25 ENCOUNTER — Other Ambulatory Visit: Payer: Self-pay | Admitting: Radiation Oncology

## 2021-01-25 DIAGNOSIS — C3491 Malignant neoplasm of unspecified part of right bronchus or lung: Secondary | ICD-10-CM

## 2021-01-25 MED ORDER — METHYLPREDNISOLONE (PAK) 4 MG PO TABS: ORAL_TABLET | ORAL | 0 refills | Status: DC

## 2021-01-25 NOTE — Telephone Encounter (Signed)
Notified patient of script sent to pharmacy and to call back if no improvement by Friday. Advised patient to follow up with Dr Raliegh Ip regarding how she feels. Patient verbalizes understanding.

## 2021-01-25 NOTE — Progress Notes (Signed)
I received note from [place]: Patient has a cough that has persisted since finishing treatment to right lung that she is concerned about. She reports taking a steroid for poison ivy shortly after completion of treatment and cough and shortness of breath improved. Since completing steroid, she is now having body aches from coughing. It is a dry cough. Tessalon pearls, Robitussin DM and honey have not improved symptoms. She has moderate shortness of breath while talking and with activity only during the day. This stops at night. No hemoptysis. Appetite also improved while taking steroids but is now poor. Please advise.  I reviewed the patient's chart and noted her recent history of radiation therapy to the chest.  She received 60 Gray in 30 fractions completed July 20. She may have some early pneumonitis.  Prescription made for Medrol Dosepak.  She follows up in a week and a half with Dr. Sondra Come who can assess her symptomatically at that time and determine if further work-up or referral to pulmonology for symptom management is appropriate.  I asked nursing to give her these instructions and let her know to contact us if she does not feel better within the next couple of days.    ICD-10-CM   1. Adenocarcinoma of right lung, stage 4 (HCC)  C34.91 methylPREDNIsolone (MEDROL DOSPACK) 4 MG tablet      -----------------------------------  Eppie Gibson, MD

## 2021-01-25 NOTE — Progress Notes (Signed)
Notified patient of script sent to pharmacy and to call back if no improvement by Friday. Advised patient to follow up with Dr Raliegh Ip regarding how she feels. Patient verbalizes understanding.

## 2021-01-26 DIAGNOSIS — H2513 Age-related nuclear cataract, bilateral: Secondary | ICD-10-CM | POA: Diagnosis not present

## 2021-01-26 DIAGNOSIS — H1132 Conjunctival hemorrhage, left eye: Secondary | ICD-10-CM | POA: Diagnosis not present

## 2021-01-26 DIAGNOSIS — H35371 Puckering of macula, right eye: Secondary | ICD-10-CM | POA: Diagnosis not present

## 2021-01-26 DIAGNOSIS — H16223 Keratoconjunctivitis sicca, not specified as Sjogren's, bilateral: Secondary | ICD-10-CM | POA: Diagnosis not present

## 2021-01-26 DIAGNOSIS — H401131 Primary open-angle glaucoma, bilateral, mild stage: Secondary | ICD-10-CM | POA: Diagnosis not present

## 2021-01-27 ENCOUNTER — Ambulatory Visit: Payer: Medicare HMO | Admitting: Nurse Practitioner

## 2021-02-02 ENCOUNTER — Ambulatory Visit (INDEPENDENT_AMBULATORY_CARE_PROVIDER_SITE_OTHER): Payer: Medicare HMO | Admitting: Nurse Practitioner

## 2021-02-02 ENCOUNTER — Other Ambulatory Visit: Payer: Self-pay

## 2021-02-02 ENCOUNTER — Encounter: Payer: Self-pay | Admitting: Radiology

## 2021-02-02 VITALS — BP 133/72 | HR 89 | Temp 97.6°F | Ht 64.0 in | Wt 129.1 lb

## 2021-02-02 DIAGNOSIS — Z7689 Persons encountering health services in other specified circumstances: Secondary | ICD-10-CM

## 2021-02-02 DIAGNOSIS — R059 Cough, unspecified: Secondary | ICD-10-CM

## 2021-02-02 DIAGNOSIS — R0602 Shortness of breath: Secondary | ICD-10-CM | POA: Insufficient documentation

## 2021-02-02 DIAGNOSIS — C3491 Malignant neoplasm of unspecified part of right bronchus or lung: Secondary | ICD-10-CM

## 2021-02-02 MED ORDER — ALBUTEROL SULFATE HFA 108 (90 BASE) MCG/ACT IN AERS: 2.0000 | INHALATION_SPRAY | Freq: Four times a day (QID) | RESPIRATORY_TRACT | 2 refills | Status: DC | PRN

## 2021-02-02 NOTE — Progress Notes (Signed)
New Patient Office Visit  Subjective:  Patient ID: Allison [name], female    DOB: 1950-07-24  Age: 70 y.o. MRN: [id number]  CC:  Chief Complaint  Patient presents with   New Patient (Initial Visit)    HPI Allison [name] presents to establish new primary care provider.  States that in July 2019 she was found to have stage IV lung cancer.  Symptoms started as shortness of breath and wheezing.  States that primary care provider at the time diagnosed with exercise-induced asthma.  Gave her rescue inhaler to use as needed.  Symptoms did not get better.  She then saw CT surgeon who had seen in the past for cardiac issues.  Did imaging which showed fluid in her lungs.  Ultimate diagnosis was lung cancer.  States that she has been undergoing treatment since then.  She completed her last radiation treatment in July, 2022.  She states that throughout her treatment she did experience shortness of breath, cough and wheezing.  She did not pay too much attention to it until most recently was diagnosed and treated for poison ivy.  She had to take 2 separate rounds of prednisone tapers.  She states that during the time she was on the prednisone tapers symptoms of shortness of breath, cough and wheezing improved.  She states that nearly resolved.  She states that when they came back, became much more evident and bothersome to her.  She states that she does have her CT scans coming up on Friday.  CT scans will evaluate her lungs, abdomen, and pelvis.  Her appointment to follow-up with oncology and radiology is Monday, February 07, 2021.  She states that talking and any amount of exertion, shortness of breath and wheezing to become worse.  She states she notices increased heart rate during these times also.  She states that rest nearly resolved the symptoms.  She sleeps without problems. She denies other concerns or complaints today.  She denies chest pain or chest pressure.  She denies headaches or visual changes.  She  denies abdominal pain, nausea, Past Medical History:  Diagnosis Date   Anxiety    Arthritis    Asthma    exercise induced   Depression    PMH   GERD (gastroesophageal reflux disease)    Glaucoma    History of radiation therapy 01/05/2021   IMRT right lung  11/24/2020-01/05/2021  Dr [name]   Hypertension    lung ca dx'd 11/2017   right   Malignant pleural effusion    right   PONV (postoperative nausea and vomiting)    Pre-diabetes    Raynaud's disease    Raynaud's disease     Past Surgical History:  Procedure Laterality Date   ABDOMINAL HYSTERECTOMY     partial   CHEST TUBE INSERTION Right 01/01/2018   Procedure: INSERTION PLEURAL DRAINAGE CATHETER;  Surgeon: Ivin Poot, MD;  Location: [facility];  Service: Thoracic;  Laterality: Right;   COLONOSCOPY     CORONARY STENT INTERVENTION N/A 06/16/2019   Procedure: CORONARY STENT INTERVENTION;  Surgeon: Jettie Booze, MD;  Location: [facility];  Service: Cardiovascular;  Laterality: N/A;   DILATION AND CURETTAGE OF UTERUS     EYE SURGERY     due to Glaucoma   LEFT HEART CATH AND CORONARY ANGIOGRAPHY N/A 06/16/2019   Procedure: LEFT HEART CATH AND CORONARY ANGIOGRAPHY;  Surgeon: Jettie Booze, MD;  Location: [facility];  Service: Cardiovascular;  Laterality: N/A;   REMOVAL OF PLEURAL DRAINAGE CATHETER Right 11/07/2018   Procedure: REMOVAL OF PLEURAL DRAINAGE CATHETER;  Surgeon: Ivin Poot, MD;  Location: [place];  Service: Thoracic;  Laterality: Right;   ROTATOR CUFF REPAIR     TUBAL LIGATION     WISDOM TOOTH EXTRACTION      Family History  Problem Relation Age of Onset   Heart disease Sister    Heart disease Brother    Lung cancer Other     Social History   Socioeconomic History   Marital status: Widowed    Spouse name: Not on file   Number of children: 3   Years of education: Not on file   Highest education level: Not on file  Occupational History    Employer: [employer]   Tobacco Use   Smoking status: Never   Smokeless tobacco: Never  Vaping Use   Vaping Use: Never used  Substance and Sexual Activity   Alcohol use: Not Currently    Comment: up to 3 drinks per week   Drug use: No    Comment: CBD oil    Sexual activity: Not Currently    Comment: Hysterectomy  Other Topics Concern   Not on file  Social History Narrative   Not on file   Social Determinants of Health   Financial Resource Strain: Not on file  Food Insecurity: Not on file  Transportation Needs: Not on file  Physical Activity: Not on file  Stress: Not on file  Social Connections: Not on file  Intimate Partner Violence: Not on file    ROS Review of Systems  Constitutional:  Positive for activity change, appetite change and fatigue. Negative for chills and fever.  HENT:  Negative for congestion, postnasal drip, rhinorrhea, sinus pressure and sinus pain.   Eyes: Negative.   Respiratory:  Positive for cough, chest tightness, shortness of breath and wheezing.   Cardiovascular:  Negative for chest pain and palpitations.  Gastrointestinal:  Negative for constipation, diarrhea, nausea and vomiting.  Endocrine: Negative for cold intolerance, heat intolerance, polydipsia and polyuria.  Genitourinary: Negative.   Musculoskeletal:  Negative for arthralgias, back pain and myalgias.  Skin:  Negative for rash.  Allergic/Immunologic: Negative.   Neurological:  Negative for dizziness, weakness and headaches.  Psychiatric/Behavioral:  The patient is nervous/anxious.    Objective:   Today's Vitals   02/02/21 1353  BP: 133/72  Pulse: 89  Temp: 97.6 F (36.4 C)  SpO2: 99%  Weight: 129 lb 1.6 oz (58.6 kg)  Height: 5\' 4"  (1.626 m)   Body mass index is 22.16 kg/m.   Physical Exam Vitals and nursing note reviewed.  Constitutional:      General: She is in acute distress.     Appearance: Normal appearance. She is well-developed.  HENT:     Head: Normocephalic and atraumatic.     Nose:  Nose normal.     Mouth/Throat:     Mouth: Mucous membranes are moist.     Pharynx: Oropharynx is clear.  Eyes:     Pupils: Pupils are equal, round, and reactive to light.  Neck:     Vascular: No carotid bruit.  Cardiovascular:     Rate and Rhythm: Normal rate and regular rhythm.     Pulses: Normal pulses.     Heart sounds: Normal heart sounds.  Pulmonary:     Effort: Pulmonary effort is normal.     Breath sounds: Normal breath sounds.     Comments:  Accessory muscle use during inspiration and expiration.  No adventitious breath sounds auscultated today. Abdominal:     Palpations: Abdomen is soft.  Musculoskeletal:        General: Normal range of motion.     Cervical back: Normal range of motion and neck supple.  Lymphadenopathy:     Cervical: No cervical adenopathy.  Skin:    General: Skin is warm and dry.     Capillary Refill: Capillary refill takes less than 2 seconds.  Neurological:     General: No focal deficit present.     Mental Status: She is alert and oriented to person, place, and time.  Psychiatric:        Mood and Affect: Mood normal.        Behavior: Behavior normal.        Thought Content: Thought content normal.        Judgment: Judgment normal.    Assessment & Plan:  1. Encounter to establish care Appointment today to establish new primary care provider.  2. Shortness of breath Acute shortness of breath and cough.  An albuterol rescue inhaler.  She may use up to 2 inhalations every 6 hours as needed for shortness of breath, wheezing, cough.  Will get chest x-ray for further evaluation.  Refer to pulmonology.  Follow-up with oncology/radiology as scheduled. - DG Chest 2 View; Future - albuterol (VENTOLIN HFA) 108 (90 Base) MCG/ACT inhaler; Inhale 2 puffs into the lungs every 6 (six) hours as needed for wheezing or shortnessofbreath during today's visit.Marland Kitchen  Dispense: 1 each; Refill: 2 - Ambulatory referral to Pulmonology  3. Cough Cough and shortness of  breath present during today's visit.  Advised inhaler.  He is up to 2 inhalations every 6 hours as needed.  Chest x-ray ordered for further evaluation.  Continue regular visits with oncology/radiology. - DG Chest 2 View; Future - albuterol (VENTOLIN HFA) 108 (90 Base) MCG/ACT inhaler; Inhale 2 puffs into the lungs every 6 (six) hours as needed for wheezing or shortness of breath.  Dispense: 1 each; Refill: 2 - Ambulatory referral to Pulmonology  4. Adenocarcinoma of right lung, stage 4 (Door) Patient completed radiation therapy in July, 2022.  She does have CT scan of chest, abdomen, and pelvis scheduled for Friday, 02/04/2021.  Will get chest x-ray today.  Refer to pulmonology.  New prescription for rescue inhaler sent to pharmacy.  Needs up to 2 inhalations 4 times daily if needed. - DG Chest 2 View; Future - albuterol (VENTOLIN HFA) 108 (90 Base) MCG/ACT inhaler; Inhale 2 puffs into the lungs every 6 (six) hours as needed for wheezing or shortness of breath.  Dispense: 1 each; Refill: 2 - Ambulatory referral to Pulmonology   Problem List Items Addressed This Visit       Respiratory   Adenocarcinoma of right lung, stage 4 (HCC)   Relevant Medications   albuterol (VENTOLIN HFA) 108 (90 Base) MCG/ACT inhaler   Other Relevant Orders   DG Chest 2 View   Ambulatory referral to Pulmonology     Other   Encounter to establish care - Primary   Shortness of breath   Relevant Medications   albuterol (VENTOLIN HFA) 108 (90 Base) MCG/ACT inhaler   Other Relevant Orders   DG Chest 2 View   Ambulatory referral to Pulmonology   Cough   Relevant Medications   albuterol (VENTOLIN HFA) 108 (90 Base) MCG/ACT inhaler   Other Relevant Orders   DG Chest 2 View   Ambulatory  referral to Pulmonology    Outpatient Encounter Medications as of 02/02/2021  Medication Sig   albuterol (VENTOLIN HFA) 108 (90 Base) MCG/ACT inhaler Inhale 2 puffs into the lungs every 6 (six) hours as needed for wheezing or  shortness of breath.   acetaminophen (TYLENOL) 500 MG tablet Take 1,000 mg by mouth every 6 (six) hours as needed (pain).   Acetylcysteine (N-ACETYL-L-CYSTEINE PO) Take 1 tablet by mouth once.   ALPRAZolam (XANAX) 0.25 MG tablet Take 0.25 mg by mouth 2 (two) times daily as needed for anxiety.    amLODipine (NORVASC) 10 MG tablet Take 1 tablet (10 mg total) by mouth daily.   aspirin 81 MG chewable tablet Chew 1 tablet (81 mg total) by mouth daily.   b complex vitamins capsule Take 1 capsule by mouth daily.   benzonatate (TESSALON) 100 MG capsule Take by mouth 3 (three) times daily as needed for cough.   Biotin 1000 MCG CHEW Chew 10 mg by mouth daily. (Patient not taking: Reported on 11/11/2020)   carvedilol (COREG) 6.25 MG tablet TAKE 1 TABLET BY MOUTH 2 TIMES DAILY WITH A MEAL. PLEASE SCHEDULE APPOINTMENT FOR FUTURE REFILLS.   cetirizine (ZYRTEC) 5 MG tablet Take 5 mg by mouth daily.   Dextromethorphan HBr (DELSYM PO) Take by mouth as needed.   dorzolamide-timolol (COSOPT) 22.3-6.8 MG/ML ophthalmic solution Instill 1 drop in both eyes twice daily   esomeprazole (NEXIUM) 20 MG packet Take 20 mg by mouth daily before breakfast.   Evolocumab (REPATHA SURECLICK) 427 MG/ML SOAJ Inject 1 pen into the skin every 14 (fourteen) days.   ezetimibe (ZETIA) 10 MG tablet Take 10 mg by mouth daily.    fluticasone (FLONASE) 50 MCG/ACT nasal spray Place 2 sprays into both nostrils daily.   hydrochlorothiazide (HYDRODIURIL) 25 MG tablet Take 25 mg by mouth daily. Currently taking 2 of the 12.5mg  daily   latanoprost (XALATAN) 0.005 % ophthalmic solution Place 1 drop into both eyes at bedtime.    lidocaine (XYLOCAINE) 2 % solution Use as directed 10 mLs in the mouth or throat as needed for mouth pain. Mix 1 part 2% viscous lidocaine, and 1 part water. Swish and swallow 10 mL of diluted mixture, 30 minutes before meals and at bedtime (up to QID).   methylPREDNIsolone (MEDROL DOSPACK) 4 MG tablet Follow package  instructions   osimertinib mesylate (TAGRISSO) 80 MG tablet Take 1 tablet (80 mg total) by mouth daily.   OVER THE COUNTER MEDICATION Collagen and Peptides (Patient not taking: Reported on 11/11/2020)   Probiotic Product (PROBIOTIC-10 PO) Take by mouth. (Patient not taking: Reported on 11/11/2020)   prochlorperazine (COMPAZINE) 10 MG tablet Take 1 tablet (10 mg total) by mouth every 6 (six) hours as needed for nausea or vomiting.   RESTASIS 0.05 % ophthalmic emulsion 1 drop 2 (two) times daily.   sucralfate (CARAFATE) 1 g tablet Take 1 tablet (1 g total) by mouth 4 (four) times daily -  with meals and at bedtime. Crush and dissolve in 10 mL of warm water prior to swallowing   VITAMIN D-VITAMIN K PO Take 1 tablet by mouth daily.   [DISCONTINUED] estradiol (ESTRACE) 0.1 MG/GM vaginal cream Place 1 Applicatorful vaginally 2 (two) times a week. (Patient not taking: Reported on 11/11/2020)   [DISCONTINUED] testosterone cypionate (DEPOTESTOSTERONE CYPIONATE) 200 MG/ML injection Depo-Testosterone 200 mg/mL intramuscular oil  Given:    GM (Patient not taking: Reported on 11/11/2020)   No facility-administered encounter medications on file as of 02/02/2021.   This  note was dictated using Systems analyst. Rapid proofreading was performed to expedite the delivery of the information. Despite proofreading, phonetic errors will occur which are common with this voice recognition software. Please take this into consideration. If there are any concerns, please contact our office.    Follow-up: Return in about 4 weeks (around 03/02/2021) for medicare wellness.   Ronnell Freshwater, NP

## 2021-02-04 ENCOUNTER — Ambulatory Visit (HOSPITAL_COMMUNITY)
Admission: RE | Admit: 2021-02-04 | Discharge: 2021-02-04 | Disposition: A | Discharge status: Home / Self Care | Payer: Medicare HMO | Source: Ambulatory Visit | Attending: Internal Medicine | Admitting: Internal Medicine

## 2021-02-04 ENCOUNTER — Inpatient Hospital Stay: Payer: Medicare HMO | Attending: Internal Medicine

## 2021-02-04 ENCOUNTER — Other Ambulatory Visit: Payer: Self-pay

## 2021-02-04 DIAGNOSIS — J189 Pneumonia, unspecified organism: Secondary | ICD-10-CM | POA: Diagnosis not present

## 2021-02-04 DIAGNOSIS — C800 Disseminated malignant neoplasm, unspecified: Secondary | ICD-10-CM | POA: Diagnosis not present

## 2021-02-04 DIAGNOSIS — C3490 Malignant neoplasm of unspecified part of unspecified bronchus or lung: Secondary | ICD-10-CM

## 2021-02-04 DIAGNOSIS — J90 Pleural effusion, not elsewhere classified: Secondary | ICD-10-CM | POA: Diagnosis not present

## 2021-02-04 DIAGNOSIS — I700 Atherosclerosis of aorta: Secondary | ICD-10-CM | POA: Diagnosis not present

## 2021-02-04 LAB — CMP (CANCER CENTER ONLY)
ALT: 27 U/L (ref 0–44)
AST: 30 U/L (ref 15–41)
Albumin: 3.5 g/dL (ref 3.5–5.0)
Alkaline Phosphatase: 57 U/L (ref 38–126)
Anion gap: 11 (ref 5–15)
BUN: 15 mg/dL (ref 8–23)
CO2: 29 mmol/L (ref 22–32)
Calcium: 9.8 mg/dL (ref 8.9–10.3)
Chloride: 100 mmol/L (ref 98–111)
Creatinine: 0.98 mg/dL (ref 0.44–1.00)
GFR, Estimated: >60 mL/min (ref >60)
Glucose, Bld: 87 mg/dL (ref 70–99)
Potassium: 3.4 mmol/L — ABNORMAL LOW (ref 3.5–5.1)
Sodium: 140 mmol/L (ref 135–145)
Total Bilirubin: 0.5 mg/dL (ref 0.3–1.2)
Total Protein: 7.1 g/dL (ref 6.5–8.1)

## 2021-02-04 LAB — CBC WITH DIFFERENTIAL (CANCER CENTER ONLY)
Abs Immature Granulocytes: 0.01 10*3/uL (ref 0.00–0.07)
Basophils Absolute: 0 10*3/uL (ref 0.0–0.1)
Basophils Relative: 1 %
Eosinophils Absolute: 0.1 10*3/uL (ref 0.0–0.5)
Eosinophils Relative: 3 %
HCT: 36.2 % (ref 36.0–46.0)
Hemoglobin: 11.8 g/dL — ABNORMAL LOW (ref 12.0–15.0)
Immature Granulocytes: 0 %
Lymphocytes Relative: 16 %
Lymphs Abs: 0.6 10*3/uL — ABNORMAL LOW (ref 0.7–4.0)
MCH: 26.1 pg (ref 26.0–34.0)
MCHC: 32.6 g/dL (ref 30.0–36.0)
MCV: 80.1 fL (ref 80.0–100.0)
Monocytes Absolute: 0.5 10*3/uL (ref 0.1–1.0)
Monocytes Relative: 15 %
Neutro Abs: 2.3 10*3/uL (ref 1.7–7.7)
Neutrophils Relative %: 65 %
Platelet Count: 115 10*3/uL — ABNORMAL LOW (ref 150–400)
RBC: 4.52 MIL/uL (ref 3.87–5.11)
RDW: 15.1 % (ref 11.5–15.5)
WBC Count: 3.6 10*3/uL — ABNORMAL LOW (ref 4.0–10.5)
nRBC: 0 % (ref 0.0–0.2)

## 2021-02-04 MED ORDER — IOHEXOL 350 MG/ML SOLN
80.0000 mL | Freq: Once | INTRAVENOUS | Status: AC | PRN
  Administered 2021-02-04: 80 mL via INTRAVENOUS

## 2021-02-05 NOTE — Progress Notes (Incomplete)
  Radiation Oncology         [phone] ________________________________  Patient Name: Allison [name] MRN: [id number] DOB: Aug 20, 1950 Referring Physician: Curt Bears (Profile Not Attached) Date of Service: 01/05/2021 [facility] [place]  End Of Treatment Note  Diagnoses: C34.91-Malignant neoplasm of unspecified part of right bronchus or lung  Cancer Staging: Stage IV (T2 a,N2, M1a) non-small cell lung cancer, adenocarcinoma   Intent: Palliative  Radiation Treatment Dates: 11/24/2020 through 01/05/2021 Site Technique Total Dose (Gy) Dose per Fx (Gy) Completed Fx Beam Energies  Lung, Right: Lung_Rt IMRT 60/60 2 30/30 6X   Narrative: The patient tolerated radiation therapy relatively well. She continues to have pain to mid chest area which has shown gradual improvement. She continues to have dry cough and shortness of breath as well as low energy level. She reports some relief with use of carafate.   Physical exam performed revealed hyperpigmentation changes to the soft clavicular area of the right upper chest from RT; she continues to use sonafine for this as directed. No skin breakdown was visualized.  Plan: The patient will follow-up with radiation oncology in one month .  ________________________________________________ -----------------------------------  Blair Promise, PhD, MD  This document serves as a record of services personally performed by [name], MD. It was created on his behalf by Roney Mans, a trained medical scribe. The creation of this record is based on the scribe's personal observations and the provider's statements to them. This document has been checked and approved by the attending provider.

## 2021-02-06 ENCOUNTER — Encounter: Payer: Self-pay | Admitting: Internal Medicine

## 2021-02-07 ENCOUNTER — Emergency Department (HOSPITAL_COMMUNITY): Payer: Medicare HMO

## 2021-02-07 ENCOUNTER — Other Ambulatory Visit: Payer: Self-pay

## 2021-02-07 ENCOUNTER — Other Ambulatory Visit: Payer: Medicare HMO

## 2021-02-07 ENCOUNTER — Inpatient Hospital Stay: Payer: Medicare HMO | Admitting: Internal Medicine

## 2021-02-07 ENCOUNTER — Inpatient Hospital Stay (HOSPITAL_COMMUNITY): Payer: Medicare HMO

## 2021-02-07 ENCOUNTER — Ambulatory Visit
Admission: RE | Admit: 2021-02-07 | Discharge: 2021-02-07 | Disposition: A | Discharge status: Home / Self Care | Payer: Medicare HMO | Source: Ambulatory Visit | Attending: Radiation Oncology | Admitting: Radiation Oncology

## 2021-02-07 ENCOUNTER — Encounter: Payer: Self-pay | Admitting: Internal Medicine

## 2021-02-07 ENCOUNTER — Inpatient Hospital Stay (HOSPITAL_COMMUNITY)
Admission: EM | Admit: 2021-02-07 | Discharge: 2021-02-10 | DRG: 193 | Disposition: A | Discharge status: Home / Self Care | Payer: Medicare HMO | Attending: Internal Medicine | Admitting: Internal Medicine

## 2021-02-07 ENCOUNTER — Encounter (HOSPITAL_COMMUNITY): Payer: Self-pay

## 2021-02-07 VITALS — BP 161/80 | HR 104 | Temp 101.0°F | Resp 22 | Ht 64.0 in | Wt 127.1 lb

## 2021-02-07 DIAGNOSIS — J45909 Unspecified asthma, uncomplicated: Secondary | ICD-10-CM | POA: Diagnosis present

## 2021-02-07 DIAGNOSIS — I700 Atherosclerosis of aorta: Secondary | ICD-10-CM | POA: Diagnosis present

## 2021-02-07 DIAGNOSIS — I081 Rheumatic disorders of both mitral and tricuspid valves: Secondary | ICD-10-CM | POA: Diagnosis present

## 2021-02-07 DIAGNOSIS — J189 Pneumonia, unspecified organism: Secondary | ICD-10-CM | POA: Diagnosis not present

## 2021-02-07 DIAGNOSIS — Z801 Family history of malignant neoplasm of trachea, bronchus and lung: Secondary | ICD-10-CM

## 2021-02-07 DIAGNOSIS — C3491 Malignant neoplasm of unspecified part of right bronchus or lung: Secondary | ICD-10-CM | POA: Diagnosis not present

## 2021-02-07 DIAGNOSIS — H409 Unspecified glaucoma: Secondary | ICD-10-CM | POA: Diagnosis present

## 2021-02-07 DIAGNOSIS — J9690 Respiratory failure, unspecified, unspecified whether with hypoxia or hypercapnia: Secondary | ICD-10-CM

## 2021-02-07 DIAGNOSIS — I82432 Acute embolism and thrombosis of left popliteal vein: Secondary | ICD-10-CM | POA: Diagnosis not present

## 2021-02-07 DIAGNOSIS — Z880 Allergy status to penicillin: Secondary | ICD-10-CM | POA: Diagnosis not present

## 2021-02-07 DIAGNOSIS — J9691 Respiratory failure, unspecified with hypoxia: Secondary | ICD-10-CM | POA: Diagnosis present

## 2021-02-07 DIAGNOSIS — I7300 Raynaud's syndrome without gangrene: Secondary | ICD-10-CM | POA: Diagnosis present

## 2021-02-07 DIAGNOSIS — I2583 Coronary atherosclerosis due to lipid rich plaque: Secondary | ICD-10-CM

## 2021-02-07 DIAGNOSIS — R059 Cough, unspecified: Secondary | ICD-10-CM

## 2021-02-07 DIAGNOSIS — K219 Gastro-esophageal reflux disease without esophagitis: Secondary | ICD-10-CM | POA: Diagnosis present

## 2021-02-07 DIAGNOSIS — Z20822 Contact with and (suspected) exposure to covid-19: Secondary | ICD-10-CM | POA: Diagnosis present

## 2021-02-07 DIAGNOSIS — I82403 Acute embolism and thrombosis of unspecified deep veins of lower extremity, bilateral: Secondary | ICD-10-CM

## 2021-02-07 DIAGNOSIS — I2609 Other pulmonary embolism with acute cor pulmonale: Secondary | ICD-10-CM | POA: Diagnosis not present

## 2021-02-07 DIAGNOSIS — R778 Other specified abnormalities of plasma proteins: Secondary | ICD-10-CM | POA: Diagnosis present

## 2021-02-07 DIAGNOSIS — C771 Secondary and unspecified malignant neoplasm of intrathoracic lymph nodes: Secondary | ICD-10-CM | POA: Diagnosis present

## 2021-02-07 DIAGNOSIS — F32A Depression, unspecified: Secondary | ICD-10-CM | POA: Diagnosis present

## 2021-02-07 DIAGNOSIS — R0600 Dyspnea, unspecified: Secondary | ICD-10-CM | POA: Diagnosis not present

## 2021-02-07 DIAGNOSIS — J90 Pleural effusion, not elsewhere classified: Secondary | ICD-10-CM

## 2021-02-07 DIAGNOSIS — Z0184 Encounter for antibody response examination: Secondary | ICD-10-CM

## 2021-02-07 DIAGNOSIS — R7303 Prediabetes: Secondary | ICD-10-CM | POA: Diagnosis present

## 2021-02-07 DIAGNOSIS — Z955 Presence of coronary angioplasty implant and graft: Secondary | ICD-10-CM

## 2021-02-07 DIAGNOSIS — I2699 Other pulmonary embolism without acute cor pulmonale: Secondary | ICD-10-CM | POA: Diagnosis present

## 2021-02-07 DIAGNOSIS — R0602 Shortness of breath: Secondary | ICD-10-CM | POA: Diagnosis not present

## 2021-02-07 DIAGNOSIS — Z923 Personal history of irradiation: Secondary | ICD-10-CM | POA: Diagnosis not present

## 2021-02-07 DIAGNOSIS — T50905A Adverse effect of unspecified drugs, medicaments and biological substances, initial encounter: Secondary | ICD-10-CM | POA: Diagnosis not present

## 2021-02-07 DIAGNOSIS — Z8249 Family history of ischemic heart disease and other diseases of the circulatory system: Secondary | ICD-10-CM

## 2021-02-07 DIAGNOSIS — I248 Other forms of acute ischemic heart disease: Secondary | ICD-10-CM | POA: Diagnosis present

## 2021-02-07 DIAGNOSIS — I10 Essential (primary) hypertension: Secondary | ICD-10-CM | POA: Diagnosis not present

## 2021-02-07 DIAGNOSIS — Z7982 Long term (current) use of aspirin: Secondary | ICD-10-CM

## 2021-02-07 DIAGNOSIS — R053 Chronic cough: Secondary | ICD-10-CM | POA: Diagnosis not present

## 2021-02-07 DIAGNOSIS — E785 Hyperlipidemia, unspecified: Secondary | ICD-10-CM | POA: Diagnosis present

## 2021-02-07 DIAGNOSIS — C3490 Malignant neoplasm of unspecified part of unspecified bronchus or lung: Secondary | ICD-10-CM

## 2021-02-07 DIAGNOSIS — J9600 Acute respiratory failure, unspecified whether with hypoxia or hypercapnia: Secondary | ICD-10-CM | POA: Diagnosis not present

## 2021-02-07 DIAGNOSIS — I2510 Atherosclerotic heart disease of native coronary artery without angina pectoris: Secondary | ICD-10-CM | POA: Diagnosis present

## 2021-02-07 DIAGNOSIS — R918 Other nonspecific abnormal finding of lung field: Secondary | ICD-10-CM | POA: Diagnosis not present

## 2021-02-07 DIAGNOSIS — Z79899 Other long term (current) drug therapy: Secondary | ICD-10-CM

## 2021-02-07 DIAGNOSIS — E7800 Pure hypercholesterolemia, unspecified: Secondary | ICD-10-CM | POA: Diagnosis not present

## 2021-02-07 DIAGNOSIS — Z888 Allergy status to other drugs, medicaments and biological substances status: Secondary | ICD-10-CM

## 2021-02-07 DIAGNOSIS — R7989 Other specified abnormal findings of blood chemistry: Secondary | ICD-10-CM | POA: Diagnosis present

## 2021-02-07 DIAGNOSIS — Z885 Allergy status to narcotic agent status: Secondary | ICD-10-CM | POA: Diagnosis not present

## 2021-02-07 DIAGNOSIS — Z5111 Encounter for antineoplastic chemotherapy: Secondary | ICD-10-CM

## 2021-02-07 DIAGNOSIS — F419 Anxiety disorder, unspecified: Secondary | ICD-10-CM | POA: Diagnosis present

## 2021-02-07 LAB — URINALYSIS, ROUTINE W REFLEX MICROSCOPIC
Bacteria, UA: NONE SEEN
Bilirubin Urine: NEGATIVE
Glucose, UA: NEGATIVE mg/dL
Ketones, ur: NEGATIVE mg/dL
Leukocytes,Ua: NEGATIVE
Nitrite: NEGATIVE
Protein, ur: NEGATIVE mg/dL
Specific Gravity, Urine: 1.005 (ref 1.005–1.030)
pH: 6 (ref 5.0–8.0)

## 2021-02-07 LAB — CBC WITH DIFFERENTIAL/PLATELET
Abs Immature Granulocytes: 0.03 10*3/uL (ref 0.00–0.07)
Basophils Absolute: 0 10*3/uL (ref 0.0–0.1)
Basophils Relative: 0 %
Eosinophils Absolute: 0.1 10*3/uL (ref 0.0–0.5)
Eosinophils Relative: 1 %
HCT: 37.3 % (ref 36.0–46.0)
Hemoglobin: 12 g/dL (ref 12.0–15.0)
Immature Granulocytes: 0 %
Lymphocytes Relative: 9 %
Lymphs Abs: 0.8 10*3/uL (ref 0.7–4.0)
MCH: 26.3 pg (ref 26.0–34.0)
MCHC: 32.2 g/dL (ref 30.0–36.0)
MCV: 81.6 fL (ref 80.0–100.0)
Monocytes Absolute: 0.8 10*3/uL (ref 0.1–1.0)
Monocytes Relative: 9 %
Neutro Abs: 7.4 10*3/uL (ref 1.7–7.7)
Neutrophils Relative %: 81 %
Platelets: 162 10*3/uL (ref 150–400)
RBC: 4.57 MIL/uL (ref 3.87–5.11)
RDW: 14.9 % (ref 11.5–15.5)
WBC: 9.2 10*3/uL (ref 4.0–10.5)
nRBC: 0 % (ref 0.0–0.2)

## 2021-02-07 LAB — MAGNESIUM: Magnesium: 2 mg/dL (ref 1.7–2.4)

## 2021-02-07 LAB — BLOOD GAS, VENOUS
Acid-base deficit: 0.2 mmol/L (ref 0.0–2.0)
Bicarbonate: 23.4 mmol/L (ref 20.0–28.0)
O2 Saturation: 76.1 %
Patient temperature: 98.6
pCO2, Ven: 36.6 mmHg — ABNORMAL LOW (ref 44.0–60.0)
pH, Ven: 7.422 (ref 7.250–7.430)
pO2, Ven: 43.6 mmHg (ref 32.0–45.0)

## 2021-02-07 LAB — COMPREHENSIVE METABOLIC PANEL
ALT: 31 U/L (ref 0–44)
AST: 39 U/L (ref 15–41)
Albumin: 3.7 g/dL (ref 3.5–5.0)
Alkaline Phosphatase: 52 U/L (ref 38–126)
Anion gap: 11 (ref 5–15)
BUN: 11 mg/dL (ref 8–23)
CO2: 25 mmol/L (ref 22–32)
Calcium: 9.5 mg/dL (ref 8.9–10.3)
Chloride: 100 mmol/L (ref 98–111)
Creatinine, Ser: 0.9 mg/dL (ref 0.44–1.00)
GFR, Estimated: >60 mL/min (ref >60)
Glucose, Bld: 105 mg/dL — ABNORMAL HIGH (ref 70–99)
Potassium: 3.6 mmol/L (ref 3.5–5.1)
Sodium: 136 mmol/L (ref 135–145)
Total Bilirubin: 0.8 mg/dL (ref 0.3–1.2)
Total Protein: 7.4 g/dL (ref 6.5–8.1)

## 2021-02-07 LAB — PHOSPHORUS: Phosphorus: 3.3 mg/dL (ref 2.5–4.6)

## 2021-02-07 LAB — LACTIC ACID, PLASMA
Lactic Acid, Venous: 1.3 mmol/L (ref 0.5–1.9)
Lactic Acid, Venous: 1.1 mmol/L (ref 0.5–1.9)

## 2021-02-07 LAB — RESP PANEL BY RT-PCR (FLU A&B, COVID) ARPGX2
Influenza A by PCR: NEGATIVE
Influenza B by PCR: NEGATIVE
SARS Coronavirus 2 by RT PCR: NEGATIVE

## 2021-02-07 LAB — CK: Total CK: 62 U/L (ref 38–234)

## 2021-02-07 LAB — PROTIME-INR
INR: 1.1 (ref 0.8–1.2)
Prothrombin Time: 14 s (ref 11.4–15.2)

## 2021-02-07 LAB — TROPONIN I (HIGH SENSITIVITY)
Troponin I (High Sensitivity): 220 ng/L (ref <18)
Troponin I (High Sensitivity): 206 ng/L (ref <18)

## 2021-02-07 LAB — PROCALCITONIN: Procalcitonin: <0.1 ng/mL

## 2021-02-07 MED ORDER — CARVEDILOL 3.125 MG PO TABS: 6.2500 mg | ORAL_TABLET | Freq: Two times a day (BID) | ORAL | Status: DC

## 2021-02-07 MED ORDER — ALBUTEROL SULFATE HFA 108 (90 BASE) MCG/ACT IN AERS: 2.0000 | INHALATION_SPRAY | Freq: Four times a day (QID) | RESPIRATORY_TRACT | Status: DC | PRN

## 2021-02-07 MED ORDER — LATANOPROST 0.005 % OP SOLN
1.0000 [drp] | Freq: Every day | OPHTHALMIC | Status: DC
  Administered 2021-02-08 – 2021-02-09 (×3): 1 [drp] via OPHTHALMIC
  Filled 2021-02-07: qty 2.5

## 2021-02-07 MED ORDER — VANCOMYCIN HCL IN DEXTROSE 1-5 GM/200ML-% IV SOLN
1000.0000 mg | Freq: Once | INTRAVENOUS | Status: AC
  Administered 2021-02-07: 1000 mg via INTRAVENOUS
  Filled 2021-02-07: qty 200

## 2021-02-07 MED ORDER — DORZOLAMIDE HCL-TIMOLOL MAL 2-0.5 % OP SOLN
1.0000 [drp] | Freq: Two times a day (BID) | OPHTHALMIC | Status: DC
  Administered 2021-02-08 – 2021-02-10 (×6): 1 [drp] via OPHTHALMIC
  Filled 2021-02-07: qty 10

## 2021-02-07 MED ORDER — SODIUM CHLORIDE 0.9 % IV BOLUS
500.0000 mL | Freq: Once | INTRAVENOUS | Status: AC
  Administered 2021-02-07: 500 mL via INTRAVENOUS

## 2021-02-07 MED ORDER — PANTOPRAZOLE SODIUM 40 MG PO TBEC
40.0000 mg | DELAYED_RELEASE_TABLET | Freq: Every day | ORAL | Status: DC
  Administered 2021-02-07 – 2021-02-10 (×4): 40 mg via ORAL
  Filled 2021-02-07 (×4): qty 1

## 2021-02-07 MED ORDER — IOHEXOL 350 MG/ML SOLN
80.0000 mL | Freq: Once | INTRAVENOUS | Status: AC | PRN
  Administered 2021-02-07: 80 mL via INTRAVENOUS

## 2021-02-07 MED ORDER — ALBUTEROL SULFATE (2.5 MG/3ML) 0.083% IN NEBU: 2.5000 mg | INHALATION_SOLUTION | RESPIRATORY_TRACT | Status: DC | PRN

## 2021-02-07 MED ORDER — SODIUM CHLORIDE 0.9 % IV SOLN
2.0000 g | Freq: Two times a day (BID) | INTRAVENOUS | Status: DC
  Administered 2021-02-08 – 2021-02-10 (×5): 2 g via INTRAVENOUS
  Filled 2021-02-07 (×6): qty 2

## 2021-02-07 MED ORDER — SODIUM CHLORIDE 0.9 % IV SOLN
2.0000 g | Freq: Once | INTRAVENOUS | Status: AC
  Administered 2021-02-07: 2 g via INTRAVENOUS
  Filled 2021-02-07: qty 2

## 2021-02-07 MED ORDER — ACETAMINOPHEN 325 MG PO TABS
650.0000 mg | ORAL_TABLET | Freq: Four times a day (QID) | ORAL | Status: DC | PRN
  Administered 2021-02-08 – 2021-02-10 (×3): 650 mg via ORAL
  Filled 2021-02-07 (×4): qty 2

## 2021-02-07 MED ORDER — EZETIMIBE 10 MG PO TABS: 10.0000 mg | ORAL_TABLET | Freq: Every day | ORAL | Status: DC

## 2021-02-07 MED ORDER — METHYLPREDNISOLONE SODIUM SUCC 125 MG IJ SOLR
125.0000 mg | Freq: Once | INTRAMUSCULAR | Status: AC
  Administered 2021-02-07: 125 mg via INTRAVENOUS
  Filled 2021-02-07: qty 2

## 2021-02-07 MED ORDER — ALPRAZOLAM 0.25 MG PO TABS
0.2500 mg | ORAL_TABLET | Freq: Two times a day (BID) | ORAL | Status: DC | PRN
  Administered 2021-02-08: 0.25 mg via ORAL
  Filled 2021-02-07: qty 1

## 2021-02-07 MED ORDER — HEPARIN BOLUS VIA INFUSION
3000.0000 [IU] | Freq: Once | INTRAVENOUS | Status: AC
  Administered 2021-02-07: 3000 [IU] via INTRAVENOUS
  Filled 2021-02-07: qty 3000

## 2021-02-07 MED ORDER — SODIUM CHLORIDE 0.9 % IV SOLN
75.0000 mL/h | INTRAVENOUS | Status: AC
  Administered 2021-02-07 (×2): 75 mL/h via INTRAVENOUS

## 2021-02-07 MED ORDER — ASPIRIN 81 MG PO CHEW
81.0000 mg | CHEWABLE_TABLET | Freq: Every day | ORAL | Status: DC
  Administered 2021-02-07 – 2021-02-10 (×4): 81 mg via ORAL
  Filled 2021-02-07 (×4): qty 1

## 2021-02-07 MED ORDER — HEPARIN (PORCINE) 25000 UT/250ML-% IV SOLN
950.0000 [IU]/h | INTRAVENOUS | Status: DC
  Administered 2021-02-07: 950 [IU]/h via INTRAVENOUS
  Filled 2021-02-07: qty 250

## 2021-02-07 MED ORDER — ACETAMINOPHEN 500 MG PO TABS
1000.0000 mg | ORAL_TABLET | Freq: Once | ORAL | Status: AC
  Administered 2021-02-07: 1000 mg via ORAL
  Filled 2021-02-07: qty 2

## 2021-02-07 MED ORDER — METHYLPREDNISOLONE SODIUM SUCC 40 MG IJ SOLR: 40.0000 mg | Freq: Two times a day (BID) | INTRAMUSCULAR | Status: DC

## 2021-02-07 MED ORDER — EZETIMIBE 10 MG PO TABS
10.0000 mg | ORAL_TABLET | Freq: Every day | ORAL | Status: DC
  Administered 2021-02-08 – 2021-02-10 (×3): 10 mg via ORAL
  Filled 2021-02-07 (×3): qty 1

## 2021-02-07 MED ORDER — LORATADINE 10 MG PO TABS
10.0000 mg | ORAL_TABLET | Freq: Every day | ORAL | Status: DC
  Administered 2021-02-08 – 2021-02-10 (×3): 10 mg via ORAL
  Filled 2021-02-07 (×3): qty 1

## 2021-02-07 MED ORDER — ACETAMINOPHEN 650 MG RE SUPP: 650.0000 mg | Freq: Four times a day (QID) | RECTAL | Status: DC | PRN

## 2021-02-07 MED ORDER — METHYLPREDNISOLONE SODIUM SUCC 40 MG IJ SOLR
40.0000 mg | Freq: Two times a day (BID) | INTRAMUSCULAR | Status: AC
  Administered 2021-02-08 (×2): 40 mg via INTRAVENOUS
  Filled 2021-02-07 (×2): qty 1

## 2021-02-07 NOTE — ED Notes (Signed)
TO CT

## 2021-02-07 NOTE — Progress Notes (Signed)
[place] [phone]   [phone]  OFFICE PROGRESS NOTE  Ronnell Freshwater, NP [place] [address]  DIAGNOSIS: Stage IV (T2 a,N2, M1a) non-small cell lung cancer, adenocarcinoma diagnosed in July 2019 and presented with right upper lobe lung mass in addition to mediastinal lymphadenopathy as well as bilateral pulmonary nodules and malignant right pleural effusion.  Biomarker Findings Microsatellite status - Cannot Be Determined Tumor Mutational Burden - Cannot Be Determined Genomic Findings For a complete list of the genes assayed, please refer to the Appendix. EGFR exon 19 deletion ([id number]) TP53 Y220C 7 Disease relevant genes with no reportable alterations: KRAS, ALK, BRAF, MET, RET, ERBB2, ROS1   PRIOR THERAPY:  1) Status post right Pleurx catheter placement by Dr. Prescott Gum for drainage of malignant right pleural effusion. 2) palliative radiotherapy to the enlarging right upper lobe lung mass and mediastinum under the care of Dr. Sondra Come expected to be completed on January 05, 2021.  CURRENT THERAPY: Tagrisso 80 mg p.o. daily.  First dose was given on 01/29/2018.  Status post 36 months of treatment.  INTERVAL HISTORY: Allison [name] 70 y.o. female returns to the clinic today for follow-up visit accompanied by her daughter.  The patient is not feeling well today with significant fatigue and weakness as well as shortness of breath and chest tightness.  She also has cough and was found to be febrile in the clinic today.  She denied having any hemoptysis.  She denied having any recent nausea, vomiting, diarrhea or constipation.  She has no headache or visual changes.  She tolerated her previous course of palliative radiation under the care of Dr. Sondra Come fairly well.  The patient had repeat CT scan of the chest, abdomen pelvis performed recently and she is here for evaluation and discussion of her scan results and  recommendation regarding her condition.  MEDICAL HISTORY: Past Medical History:  Diagnosis Date   Anxiety    Arthritis    Asthma    exercise induced   Depression    PMH   GERD (gastroesophageal reflux disease)    Glaucoma    History of radiation therapy 01/05/2021   IMRT right lung  11/24/2020-01/05/2021  Dr [name]   Hypertension    lung ca dx'd 11/2017   right   Malignant pleural effusion    right   PONV (postoperative nausea and vomiting)    Pre-diabetes    Raynaud's disease    Raynaud's disease     ALLERGIES:  is allergic to penicillins, vicodin [hydrocodone-acetaminophen], and other.  MEDICATIONS:  Current Outpatient Medications  Medication Sig Dispense Refill   acetaminophen (TYLENOL) 500 MG tablet Take 1,000 mg by mouth every 6 (six) hours as needed (pain).     albuterol (VENTOLIN HFA) 108 (90 Base) MCG/ACT inhaler Inhale 2 puffs into the lungs every 6 (six) hours as needed for wheezing or shortness of breath. 1 each 2   ALPRAZolam (XANAX) 0.25 MG tablet Take 0.25 mg by mouth 2 (two) times daily as needed for anxiety.      amLODipine (NORVASC) 5 MG tablet Take 5 mg by mouth daily.     aspirin 81 MG chewable tablet Chew 1 tablet (81 mg total) by mouth daily.     b complex vitamins capsule Take 1 capsule by mouth daily.     benzonatate (TESSALON) 100 MG capsule Take by mouth 3 (three) times daily as needed for cough.  carvedilol (COREG) 6.25 MG tablet TAKE 1 TABLET BY MOUTH 2 TIMES DAILY WITH A MEAL. PLEASE SCHEDULE APPOINTMENT FOR FUTURE REFILLS. 180 tablet 1   cetirizine (ZYRTEC) 5 MG tablet Take 5 mg by mouth daily.     Dextromethorphan HBr (DELSYM PO) Take by mouth as needed.     dorzolamide-timolol (COSOPT) 22.3-6.8 MG/ML ophthalmic solution Instill 1 drop in both eyes twice daily     esomeprazole (NEXIUM) 20 MG packet Take 20 mg by mouth daily before breakfast.     Evolocumab (REPATHA SURECLICK) 206 MG/ML SOAJ Inject 1 pen into the skin every 14 (fourteen)  days. 2 mL 11   ezetimibe (ZETIA) 10 MG tablet Take 10 mg by mouth daily.      fluticasone (FLONASE) 50 MCG/ACT nasal spray Place 2 sprays into both nostrils daily. 16 g 6   hydrochlorothiazide (HYDRODIURIL) 25 MG tablet Take 25 mg by mouth daily. Currently taking 2 of the 12.$RemoveB'5mg'DoNRrdhV$  daily     latanoprost (XALATAN) 0.005 % ophthalmic solution Place 1 drop into both eyes at bedtime.      methylPREDNIsolone (MEDROL DOSPACK) 4 MG tablet Follow package instructions 21 tablet 0   osimertinib mesylate (TAGRISSO) 80 MG tablet Take 1 tablet (80 mg total) by mouth daily. 30 tablet 3   OVER THE COUNTER MEDICATION Collagen and Peptides (Patient not taking: Reported on 11/11/2020)     Probiotic Product (PROBIOTIC-10 PO) Take by mouth. (Patient not taking: Reported on 11/11/2020)     prochlorperazine (COMPAZINE) 10 MG tablet Take 1 tablet (10 mg total) by mouth every 6 (six) hours as needed for nausea or vomiting. 30 tablet 0   RESTASIS 0.05 % ophthalmic emulsion 1 drop 2 (two) times daily.     sucralfate (CARAFATE) 1 g tablet Take 1 tablet (1 g total) by mouth 4 (four) times daily -  with meals and at bedtime. Crush and dissolve in 10 mL of warm water prior to swallowing 120 tablet 1   triamcinolone cream (KENALOG) 0.1 % Apply 1 application topically 2 (two) times daily.     VITAMIN D-VITAMIN K PO Take 1 tablet by mouth daily.     No current facility-administered medications for this visit.    SURGICAL HISTORY:  Past Surgical History:  Procedure Laterality Date   ABDOMINAL HYSTERECTOMY     partial   CHEST TUBE INSERTION Right 01/01/2018   Procedure: INSERTION PLEURAL DRAINAGE CATHETER;  Surgeon: Ivin Poot, MD;  Location: [facility];  Service: Thoracic;  Laterality: Right;   COLONOSCOPY     CORONARY STENT INTERVENTION N/A 06/16/2019   Procedure: CORONARY STENT INTERVENTION;  Surgeon: Jettie Booze, MD;  Location: [facility];  Service: Cardiovascular;  Laterality: N/A;   DILATION AND  CURETTAGE OF UTERUS     EYE SURGERY     due to Glaucoma   LEFT HEART CATH AND CORONARY ANGIOGRAPHY N/A 06/16/2019   Procedure: LEFT HEART CATH AND CORONARY ANGIOGRAPHY;  Surgeon: Jettie Booze, MD;  Location: [facility];  Service: Cardiovascular;  Laterality: N/A;   REMOVAL OF PLEURAL DRAINAGE CATHETER Right 11/07/2018   Procedure: REMOVAL OF PLEURAL DRAINAGE CATHETER;  Surgeon: Ivin Poot, MD;  Location: [facility];  Service: Thoracic;  Laterality: Right;   ROTATOR CUFF REPAIR     TUBAL LIGATION     WISDOM TOOTH EXTRACTION      REVIEW OF SYSTEMS:  Constitutional: positive for fatigue, fevers, and weight loss Eyes: negative Ears, nose, mouth, throat, and face: negative  Respiratory: positive for cough, dyspnea on exertion, and pleurisy/chest pain Cardiovascular: negative Gastrointestinal: negative Genitourinary:negative Integument/breast: negative Hematologic/lymphatic: negative Musculoskeletal:positive for arthralgias Neurological: negative Behavioral/Psych: negative Endocrine: negative Allergic/Immunologic: negative   PHYSICAL EXAMINATION: General appearance: alert, cooperative, fatigued, and no distress Head: Normocephalic, without obvious abnormality, atraumatic Neck: no adenopathy, no JVD, supple, symmetrical, trachea midline, and thyroid not enlarged, symmetric, no tenderness/mass/nodules Lymph nodes: Cervical, supraclavicular, and axillary nodes normal. Resp: diminished breath sounds RLL, dullness to percussion RLL, and rales RLL Back: symmetric, no curvature. ROM normal. No CVA tenderness. Cardio: regular rate and rhythm, S1, S2 normal, no murmur, click, rub or gallop GI: soft, non-tender; bowel sounds normal; no masses,  no organomegaly Extremities: extremities normal, atraumatic, no cyanosis or edema Neurologic: Alert and oriented X 3, normal strength and tone. Normal symmetric reflexes. Normal coordination and gait  ECOG PERFORMANCE STATUS: 1 -  Symptomatic but completely ambulatory  Blood pressure (!) 161/80, pulse (!) 104, temperature (!) 101 F (38.3 C), temperature source Tympanic, resp. rate (!) 22, height $RemoveBe'5\' 4"'XJDyckWLk$  (1.626 m), weight 127 lb 1.6 oz (57.7 kg), SpO2 100 %.  LABORATORY DATA: Lab Results  Component Value Date   WBC 3.6 (L) 02/04/2021   HGB 11.8 (L) 02/04/2021   HCT 36.2 02/04/2021   MCV 80.1 02/04/2021   PLT 115 (L) 02/04/2021      Chemistry      Component Value Date/Time   NA 140 02/04/2021 0928   NA 140 09/13/2020 0847   K 3.4 (L) 02/04/2021 0928   CL 100 02/04/2021 0928   CO2 29 02/04/2021 0928   BUN 15 02/04/2021 0928   BUN 10 09/13/2020 0847   CREATININE 0.98 02/04/2021 0928      Component Value Date/Time   CALCIUM 9.8 02/04/2021 0928   ALKPHOS 57 02/04/2021 0928   AST 30 02/04/2021 0928   ALT 27 02/04/2021 0928   BILITOT 0.5 02/04/2021 0928       RADIOGRAPHIC STUDIES: CT Chest W Contrast  Result Date: 02/06/2021 CLINICAL DATA:  Non-small cell lung cancer staging, ongoing [facility] EXAM: CT CHEST, ABDOMEN, AND PELVIS WITH CONTRAST TECHNIQUE: Multidetector CT imaging of the chest, abdomen and pelvis was performed following the standard protocol during bolus administration of intravenous contrast. CONTRAST:  65mL OMNIPAQUE IOHEXOL 350 MG/ML SOLN, additional oral enteric contrast COMPARISON:  PET-CT, 11/09/2020 FINDINGS: CT CHEST FINDINGS Cardiovascular: Aortic atherosclerosis. Normal heart size. Right coronary artery calcifications and stents. No pericardial effusion. Mediastinum/Nodes: Slight interval decrease in size of prominent, although no longer pathologically enlarged mediastinal lymph nodes, index pretracheal node measuring 0.9 x 0.7 cm, previously 1.3 x 1.3 cm (series 2, image 20). Thyroid gland, trachea, and esophagus demonstrate no significant findings. Lungs/Pleura: Interval decrease in size of a mass of the right pulmonary apex, measuring 2.8 x 1.8 cm, previously 4.4 x 3.5 cm. Interval  increase of a small right pleural effusion, now with clear evidence of lymphangitic carcinomatosis involving both lungs,, right greater than left, with diffuse septal and pleural thickening and nodularity, particularly about the right lung base (series 2, image 41). There are areas of significant consolidation in right middle and right lower lobes (series 6, image 80). No pleural effusion or pneumothorax. Musculoskeletal: No chest wall mass or suspicious bone lesions identified. CT ABDOMEN PELVIS FINDINGS Hepatobiliary: No solid liver abnormality is seen. No gallstones, gallbladder wall thickening, or biliary dilatation. Pancreas: Unremarkable. No pancreatic ductal dilatation or surrounding inflammatory changes. Spleen: Normal in size without significant abnormality. Adrenals/Urinary Tract: Adrenal glands are unremarkable. Kidneys  are normal, without renal calculi, solid lesion, or hydronephrosis. Bladder is unremarkable. Stomach/Bowel: Stomach is within normal limits. Appendix is not clearly visualized and may be surgically absent. No evidence of bowel wall thickening, distention, or inflammatory changes. Vascular/Lymphatic: Aortic atherosclerosis. No enlarged abdominal or pelvic lymph nodes. Reproductive: Status post hysterectomy. Other: No abdominal wall hernia or abnormality. No abdominopelvic ascites. Musculoskeletal: No acute or significant osseous findings. IMPRESSION: 1. Interval decrease in size of a mass of the right pulmonary apex. 2. Slight interval decrease in size of prominent, previously FDG avid mediastinal lymph nodes. 3. 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 4. There are areas of significant consolidation in right middle and right lower lobes, which may reflect confluent metastatic  disease, or alternately infectious, inflammatory, or postobstructive airspace disease. 5. Overall constellation of findings is consistent with mixed response to treatment. 6. No evidence of metastatic disease in the abdomen or pelvis. 7. Coronary artery disease. Aortic Atherosclerosis (ICD10-I70.0). Electronically Signed   By: Eddie Candle M.D.   On: 02/06/2021 14:23   CT Abdomen Pelvis W Contrast  Result Date: 02/06/2021 CLINICAL DATA:  Non-small cell lung cancer staging, ongoing [facility] EXAM: CT CHEST, ABDOMEN, AND PELVIS WITH CONTRAST TECHNIQUE: Multidetector CT imaging of the chest, abdomen and pelvis was performed following the standard protocol during bolus administration of intravenous contrast. CONTRAST:  65mL OMNIPAQUE IOHEXOL 350 MG/ML SOLN, additional oral enteric contrast COMPARISON:  PET-CT, 11/09/2020 FINDINGS: CT CHEST FINDINGS Cardiovascular: Aortic atherosclerosis. Normal heart size. Right coronary artery calcifications and stents. No pericardial effusion. Mediastinum/Nodes: Slight interval decrease in size of prominent, although no longer pathologically enlarged mediastinal lymph nodes, index pretracheal node measuring 0.9 x 0.7 cm, previously 1.3 x 1.3 cm (series 2, image 20). Thyroid gland, trachea, and esophagus demonstrate no significant findings. Lungs/Pleura: Interval decrease in size of a mass of the right pulmonary apex, measuring 2.8 x 1.8 cm, previously 4.4 x 3.5 cm. 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 There are areas of significant consolidation in right middle and right lower lobes (series 6, image 80). No pleural effusion or pneumothorax. Musculoskeletal: No chest wall mass or suspicious bone lesions identified. CT ABDOMEN PELVIS FINDINGS  Hepatobiliary: No solid liver abnormality is seen. No gallstones, gallbladder wall thickening, or biliary dilatation. Pancreas: Unremarkable. No pancreatic ductal dilatation or surrounding inflammatory changes. Spleen: Normal in size without significant abnormality. Adrenals/Urinary Tract: Adrenal glands are unremarkable. Kidneys are normal, without renal calculi, solid lesion, or hydronephrosis. Bladder is unremarkable. Stomach/Bowel: Stomach is within normal limits. Appendix is not clearly visualized and may be surgically absent. No evidence of bowel wall thickening, distention, or inflammatory changes. Vascular/Lymphatic: Aortic atherosclerosis. No enlarged abdominal or pelvic lymph nodes. Reproductive: Status post hysterectomy. Other: No abdominal wall hernia or abnormality. No abdominopelvic ascites. Musculoskeletal: No acute or significant osseous findings. IMPRESSION: 1. Interval decrease in size of a mass of the right pulmonary apex. 2. Slight interval decrease in size of prominent, previously FDG avid mediastinal lymph nodes. 3. Interval increase of a small right pleural effusion, now with clear evidence of lymphangitic carcinomatosis involving both lungs, right greater than left, with diffuse septal and pleural thickening  and nodularity, particularly about the right lung base. 4. There are areas of significant consolidation in right middle and right lower lobes, which may reflect confluent metastatic disease, or alternately infectious, inflammatory, or postobstructive airspace disease. 5. Overall constellation of findings is consistent with mixed response to treatment. 6. No evidence of metastatic disease in the abdomen or pelvis. 7. Coronary artery disease. Aortic Atherosclerosis (ICD10-I70.0). Electronically Signed   By: Eddie Candle M.D.   On: 02/06/2021 14:23     ASSESSMENT AND PLAN: This is a very pleasant 70 years old never smoker African-American female recently with a stage IV non-small cell  lung cancer, adenocarcinoma with positive EGFR mutation with deletion in exon 19. The patient was started on treatment with Tagrisso 80 mg p.o. daily status post 35 months of treatment. She has been tolerating this treatment well with no concerning adverse effects except for intermittent diarrhea. She had repeat CT scan of the chest, abdomen pelvis performed recently.  I personally and independently reviewed the scans and discussed the results with the patient and her boyfriend today. Unfortunately the CT scan showed interval progression of the right apical lung mass in addition to progression of mediastinal lymphadenopathy concerning for worsening of her disease. She has no actionable resistant mutation on the molecular studies performed by [facility]. The patient continued her current treatment with Tagrisso and tolerating it fairly well. 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 Sondra Come completed January 05, 2021. The patient continued her treatment with Tagrisso and has been tolerating it well but for the last few days she has been complaining of increasing shortness of breath, chest tightness as well as cough and fever. She had repeat CT scan of the chest, abdomen pelvis performed recently.  I personally and independently reviewed the scan images and discussed the results with the patient and her daughter.  The scan showed improvement in the size of the right apical lung mass as well as mediastinal lymphadenopathy but there was interval increase of his small right pleural effusion as well as clear evidence of lymphangitic carcinomatosis versus inflammatory process in the right middle and lower lobes. The patient does not feel well today and she is febrile.  I recommended for her to go immediately to the emergency department for further evaluation and management of her condition and possible  admission for treatment of underlying pneumonia/pneumonitis with antibiotics as well as steroids. Will hold her treatment with Tagrisso for now until improvement of her symptoms in case this is a drug-induced pneumonitis. If no improvement in her opacity in the right lung, I will consider changing her treatment to systemic chemotherapy or discussion of palliative care option. The patient and her daughter are in agreement with the current plan and she will go immediately to the emergency department today. I will continue to follow her up and assist in her management during her hospitalization. The patient voices understanding of current disease status and treatment options and is in agreement with the current care plan. All questions were answered. The patient knows to call the clinic with any problems, questions or concerns. We can certainly see the patient much sooner if necessary. The total time spent in the appointment was 35 minutes.  Disclaimer: This note was dictated with voice recognition software. Similar sounding words can inadvertently be transcribed and may not be corrected upon review.

## 2021-02-07 NOTE — Progress Notes (Signed)
Dyspnea,tachypnea with temp 101 F via ( ear). Pt also reports chest tightness over sternum.PT anxious. Symptoms started Thursday . She started a new inhaler , albuterol on wed.  Dr Julien Nordmann in to see pt. Report called to [facility] in ED.  Pt transported to ED via wheelchair.

## 2021-02-07 NOTE — Progress Notes (Signed)
A consult was received from an ED physician for vancomycin and cefepime per pharmacy dosing.  The patient's profile has been reviewed for ht/wt/allergies/indication/available labs.    A one time order has been placed for vancomycin 1gm and cefepime 2gm IV x1 .  Further antibiotics/pharmacy consults should be ordered by admitting physician if indicated.                       Thank you, Lynelle Doctor 02/07/2021  4:25 PM

## 2021-02-07 NOTE — ED Triage Notes (Signed)
Patient brought in via [place] by daughter. Patient c/o SOB with tight chest pain especially when coughing. Symptoms have been going on for about a week. Patient is lung Ca patient. Seen PCP Wednesday

## 2021-02-07 NOTE — Progress Notes (Signed)
ANTICOAGULATION CONSULT NOTE - Initial Consult  Pharmacy Consult for heparin Indication: pulmonary embolus  Allergies  Allergen Reactions   Penicillins Other (See Comments)    [facility] PATIENT HAS HAD A PCN REACTION WITH IMMEDIATE RASH, FACIAL/TONGUE/THROAT SWELLING, SOB, OR LIGHTHEADEDNESS WITH HYPOTENSION:  #  #  YES  #  # Has patient had a PCN reaction causing severe rash involving mucus membranes or skin necrosis: No Has patient had a PCN reaction that required hospitalization: No Has patient had a PCN reaction occurring within the last 10 years: No    Vicodin [Hydrocodone-Acetaminophen] Other (See Comments)    Sped up heart and breathing   Other Other (See Comments)    Glaucoma eye drop    Patient Measurements: Height: 5\' 4"  (162.6 cm) Weight: 57.6 kg (127 lb) IBW/kg (Calculated) : 54.7 Heparin Dosing Weight: 57.6kg  Vital Signs: Temp: 99.7 F (37.6 C) (08/22 1830) Temp Source: Oral (08/22 1830) BP: 135/79 (08/22 2130) Pulse Rate: 95 (08/22 2130)  Labs: Recent Labs    02/07/21 1640 02/07/21 1838  HGB 12.0  --   HCT 37.3  --   PLT 162  --   LABPROT 14.0  --   INR 1.1  --   CREATININE 0.90  --   TROPONINIHS 220* 206*    Estimated Creatinine Clearance: 50.2 mL/min (by C-G formula based on SCr of 0.9 mg/dL).   Medical History: Past Medical History:  Diagnosis Date   Anxiety    Arthritis    Asthma    exercise induced   Depression    PMH   GERD (gastroesophageal reflux disease)    Glaucoma    History of radiation therapy 01/05/2021   IMRT right lung  11/24/2020-01/05/2021  Dr [name]   Hypertension    lung ca dx'd 11/2017   right   Malignant pleural effusion    right   PONV (postoperative nausea and vomiting)    Pre-diabetes    Raynaud's disease    Raynaud's disease      Assessment: 70 yo female with known hx of lung cancer, presents with increasing cough and SOB over the last 2 weeks.  Pharmacy consulted to dose heparin for PE.  No prior AC  noted  CBC WNL Scr 0.9, CrCl ~ 50.19mls/min aPTT/INR WNL   Goal of Therapy:  Heparin level 0.3-0.7 units/ml Monitor platelets by anticoagulation protocol: Yes   Plan:  Heparin bolus 3000 units x 1 Start heparin drip at 950 units/hr Heparin level in 8 hours Daily CBC   Dolly Rias RPh 02/07/2021, 9:50 PM

## 2021-02-07 NOTE — Progress Notes (Signed)
Pharmacy Antibiotic Note  Allison [name] is a 70 y.o. female presented to the ED on 02/07/2021 with c/o SOB and fever. CXR showed findings with concern for PNA. Pharmacy has been consulted to dose cefepime for PNA.   Plan: - Cefepime 2gm q12h  ______________________________________  Height: 5\' 4"  (162.6 cm) Weight: 57.6 kg (127 lb) IBW/kg (Calculated) : 54.7  Temp (24hrs), Avg:100.7 F (38.2 C), Min:99.7 F (37.6 C), Max:101.3 F (38.5 C)  Recent Labs  Lab 02/04/21 0928 02/07/21 1640  WBC 3.6* 9.2  CREATININE 0.98 0.90  LATICACIDVEN  --  1.3    Estimated Creatinine Clearance: 50.2 mL/min (by C-G formula based on SCr of 0.9 mg/dL).    Allergies  Allergen Reactions   Penicillins Other (See Comments)    [facility] PATIENT HAS HAD A PCN REACTION WITH IMMEDIATE RASH, FACIAL/TONGUE/THROAT SWELLING, SOB, OR LIGHTHEADEDNESS WITH HYPOTENSION:  #  #  YES  #  # Has patient had a PCN reaction causing severe rash involving mucus membranes or skin necrosis: No Has patient had a PCN reaction that required hospitalization: No Has patient had a PCN reaction occurring within the last 10 years: No    Vicodin [Hydrocodone-Acetaminophen] Other (See Comments)    Sped up heart and breathing   Other Other (See Comments)    Glaucoma eye drop     Thank you for allowing pharmacy to be a part of this patient's care.  Lynelle Doctor 02/07/2021 6:57 PM

## 2021-02-07 NOTE — ED Provider Notes (Signed)
[facility] DEPT Provider Note   CSN: [id number] Arrival date & time: 02/07/21  1538     History Chief Complaint  Patient presents with   Shortness of Allison [name] is a 70 y.o. female.  70 year old female with prior medical history as detailed below presents for evaluation per patient was sent from Dr. Lew Dawes oncology clinic for evaluation and  likely admission.  Patient with known history of lung cancer.  Patient reports increasing cough and shortness of breath over the last 2 weeks.  She reports use of Solu-Medrol Dosepak x2.  Symptoms did improve transiently with use of steroids.  Patient is febrile today.  She denies recent fever at home.  Patient is reporting CT of the chest performed yesterday with concern for possible infectious process.  The history is provided by the patient.  Shortness of Breath Severity:  Moderate Onset quality:  Gradual Duration:  2 weeks Timing:  Intermittent Progression:  Worsening Chronicity:  New     Past Medical History:  Diagnosis Date   Anxiety    Arthritis    Asthma    exercise induced   Depression    PMH   GERD (gastroesophageal reflux disease)    Glaucoma    History of radiation therapy 01/05/2021   IMRT right lung  11/24/2020-01/05/2021  Dr [name]   Hypertension    lung ca dx'd 11/2017   right   Malignant pleural effusion    right   PONV (postoperative nausea and vomiting)    Pre-diabetes    Raynaud's disease    Raynaud's disease     Patient Active Problem List   Diagnosis Date Noted   Encounter to establish care 02/02/2021   Shortness of breath 02/02/2021   Cough 02/02/2021   Statin myopathy 12/03/2020   Acute non-recurrent frontal sinusitis 11/18/2020   NSCLC with EGFR mutation ([place]) 06/30/2020   Hx of chest tube placement 02/11/2020   History of chest tube placement 11/12/2019   Abnormal liver function 08/20/2019   Gastroesophageal reflux disease without  esophagitis 08/20/2019   Generalized anxiety disorder 08/20/2019   Glaucoma 08/20/2019   Hyperlipidemia 08/20/2019   Menopausal syndrome 08/20/2019   Raynaud's phenomenon 08/20/2019   Vitamin D deficiency 08/20/2019   Status post coronary artery stent placement    History of non-ST elevation myocardial infarction (NSTEMI) 06/12/2019   Hypertension 03/24/2019   Encounter for antineoplastic chemotherapy 01/22/2018   Adenocarcinoma of right lung, stage 4 ([place]) 01/04/2018   Goals of care, counseling/discussion 01/04/2018   Thyroid nodule 12/11/2017   Pleural effusion, right 12/11/2017   Insomnia 04/20/2014    Past Surgical History:  Procedure Laterality Date   ABDOMINAL HYSTERECTOMY     partial   CHEST TUBE INSERTION Right 01/01/2018   Procedure: INSERTION PLEURAL DRAINAGE CATHETER;  Surgeon: Ivin Poot, MD;  Location: [facility];  Service: Thoracic;  Laterality: Right;   COLONOSCOPY     CORONARY STENT INTERVENTION N/A 06/16/2019   Procedure: CORONARY STENT INTERVENTION;  Surgeon: Jettie Booze, MD;  Location: [facility];  Service: Cardiovascular;  Laterality: N/A;   DILATION AND CURETTAGE OF UTERUS     EYE SURGERY     due to Glaucoma   LEFT HEART CATH AND CORONARY ANGIOGRAPHY N/A 06/16/2019   Procedure: LEFT HEART CATH AND CORONARY ANGIOGRAPHY;  Surgeon: Jettie Booze, MD;  Location: [facility];  Service: Cardiovascular;  Laterality: N/A;   REMOVAL OF PLEURAL DRAINAGE CATHETER Right  11/07/2018   Procedure: REMOVAL OF PLEURAL DRAINAGE CATHETER;  Surgeon: Ivin Poot, MD;  Location: [facility];  Service: Thoracic;  Laterality: Right;   ROTATOR CUFF REPAIR     TUBAL LIGATION     WISDOM TOOTH EXTRACTION       OB History   No obstetric history on file.     Family History  Problem Relation Age of Onset   Heart disease Sister    Heart disease Brother    Lung cancer Other     Social History   Tobacco Use   Smoking status: Never   Smokeless  tobacco: Never  Vaping Use   Vaping Use: Never used  Substance Use Topics   Alcohol use: Not Currently    Comment: up to 3 drinks per week   Drug use: No    Comment: CBD oil     Home Medications Prior to Admission medications   Medication Sig Start Date End Date Taking? Authorizing Provider  acetaminophen (TYLENOL) 500 MG tablet Take 1,000 mg by mouth every 6 (six) hours as needed (pain).    [provider]  albuterol (VENTOLIN HFA) 108 (90 Base) MCG/ACT inhaler Inhale 2 puffs into the lungs every 6 (six) hours as needed for wheezing or shortness of breath. 02/02/21   Ronnell Freshwater, NP  ALPRAZolam (XANAX) 0.25 MG tablet Take 0.25 mg by mouth 2 (two) times daily as needed for anxiety.     [provider]  amLODipine (NORVASC) 5 MG tablet Take 5 mg by mouth daily. 11/23/20   [provider]  aspirin 81 MG chewable tablet Chew 1 tablet (81 mg total) by mouth daily. 06/18/19   Georgette Shell, MD  b complex vitamins capsule Take 1 capsule by mouth daily.    [provider]  benzonatate (TESSALON) 100 MG capsule Take by mouth 3 (three) times daily as needed for cough.    [provider]  carvedilol (COREG) 6.25 MG tablet TAKE 1 TABLET BY MOUTH 2 TIMES DAILY WITH A MEAL. PLEASE SCHEDULE APPOINTMENT FOR FUTURE REFILLS. 12/08/20   Donato Heinz, MD  cetirizine (ZYRTEC) 5 MG tablet Take 5 mg by mouth daily.    [provider]  Dextromethorphan HBr (DELSYM PO) Take by mouth as needed.    [provider]  dorzolamide-timolol (COSOPT) 22.3-6.8 MG/ML ophthalmic solution Instill 1 drop in both eyes twice daily    [provider]  esomeprazole (NEXIUM) 20 MG packet Take 20 mg by mouth daily before breakfast.    [provider]  Evolocumab (REPATHA SURECLICK) 546 MG/ML SOAJ Inject 1 pen into the skin every 14 (fourteen) days. 09/20/20   Donato Heinz, MD  ezetimibe (ZETIA) 10 MG tablet Take 10 mg by  mouth daily.     [provider]  fluticasone (FLONASE) 50 MCG/ACT nasal spray Place 2 sprays into both nostrils daily. 11/17/20   Mayers, Cari S, PA-C  hydrochlorothiazide (HYDRODIURIL) 25 MG tablet Take 25 mg by mouth daily. Currently taking 2 of the 12.37m daily    [provider]  latanoprost (XALATAN) 0.005 % ophthalmic solution Place 1 drop into both eyes at bedtime.  12/14/17   [provider]  methylPREDNIsolone (MEDROL DOSPACK) 4 MG tablet Follow package instructions 01/25/21   SEppie Gibson MD  osimertinib mesylate (TAGRISSO) 80 MG tablet Take 1 tablet (80 mg total) by mouth daily. 09/14/20   MCurt Bears MD  OVER THE COUNTER MEDICATION Collagen and Peptides Patient not taking: Reported  on 11/11/2020    [provider]  Probiotic Product (PROBIOTIC-10 PO) Take by mouth. Patient not taking: Reported on 11/11/2020    [provider]  prochlorperazine (COMPAZINE) 10 MG tablet Take 1 tablet (10 mg total) by mouth every 6 (six) hours as needed for nausea or vomiting. 12/27/20   Curt Bears, MD  RESTASIS 0.05 % ophthalmic emulsion 1 drop 2 (two) times daily. 07/28/20   [provider]  sucralfate (CARAFATE) 1 g tablet Take 1 tablet (1 g total) by mouth 4 (four) times daily -  with meals and at bedtime. Crush and dissolve in 10 mL of warm water prior to swallowing 12/16/20   [name], MD  triamcinolone cream (KENALOG) 0.1 % Apply 1 application topically 2 (two) times daily. 01/11/21   [provider]  VITAMIN D-VITAMIN K PO Take 1 tablet by mouth daily.    [provider]    Allergies    Penicillins, Vicodin [hydrocodone-acetaminophen], and Other  Review of Systems   Review of Systems  Respiratory:  Positive for shortness of breath.   All other systems reviewed and are negative.  Physical Exam Updated Vital Signs BP (!) 163/110 (BP Location: Right Arm)   Pulse (!) 109   Temp (!) 101.3 F (38.5 C) (Oral)    Resp (!) 28   Ht '5\' 4"'  (1.626 m)   Wt 57.6 kg   LMP  (LMP Unknown)   SpO2 98%   BMI 21.80 kg/m   Physical Exam Vitals and nursing note reviewed.  Constitutional:      General: She is not in acute distress.    Appearance: Normal appearance. She is well-developed.  HENT:     Head: Normocephalic and atraumatic.  Eyes:     Conjunctiva/sclera: Conjunctivae normal.     Pupils: Pupils are equal, round, and reactive to light.  Cardiovascular:     Rate and Rhythm: Normal rate and regular rhythm.     Heart sounds: Normal heart sounds.  Pulmonary:     Effort: Pulmonary effort is normal. No respiratory distress.     Comments: Decreased BS at Bilateral bases  Abdominal:     General: There is no distension.     Palpations: Abdomen is soft.     Tenderness: There is no abdominal tenderness.  Musculoskeletal:        General: No deformity. Normal range of motion.     Cervical back: Normal range of motion and neck supple.  Skin:    General: Skin is warm and dry.  Neurological:     General: No focal deficit present.     Mental Status: She is alert and oriented to person, place, and time.    ED Results / Procedures / Treatments   Labs (all labs ordered are listed, but only abnormal results are displayed) Labs Reviewed  CULTURE, BLOOD (ROUTINE X 2)  CULTURE, BLOOD (ROUTINE X 2)  RESP PANEL BY RT-PCR (FLU A&B, COVID) ARPGX2  URINALYSIS, ROUTINE W REFLEX MICROSCOPIC  COMPREHENSIVE METABOLIC PANEL  CBC WITH DIFFERENTIAL/PLATELET  LACTIC ACID, PLASMA  LACTIC ACID, PLASMA  PROTIME-INR  TROPONIN I (HIGH SENSITIVITY)    EKG None     Radiology No results found.  Procedures Procedures   Medications Ordered in ED Medications  sodium chloride 0.9 % bolus 500 mL (has no administration in time range)  methylPREDNISolone sodium succinate (SOLU-MEDROL) 125 mg/2 mL injection 125 mg (has no administration in time range)  acetaminophen (TYLENOL) tablet 1,000 mg (has no administration in  time range)  ceFEPIme (MAXIPIME) 2 g in sodium chloride 0.9 % 100 mL IVPB (has no administration in time range)  vancomycin (VANCOCIN) IVPB 1000 mg/200 mL premix (has no administration in time range)    ED Course  I have reviewed the triage vital signs and the nursing notes.  Pertinent labs & imaging results that were available during my care of the patient were reviewed by me and considered in my medical decision making (see chart for details).    MDM Rules/Calculators/A&P                           MDM  MSE complete  Allison [name] was evaluated in Emergency Department on 02/07/2021 for the symptoms described in the history of present illness. She was evaluated in the context of the global COVID-19 pandemic, which necessitated consideration that the patient might be at risk for infection with the SARS-CoV-2 virus that causes COVID-19. Institutional protocols and algorithms that pertain to the evaluation of patients at risk for COVID-19 are in a state of rapid change based on information released by regulatory bodies including the CDC and federal and state organizations. These policies and algorithms were followed during the patient's care in the ED.   Patient presented with complaint of shortness of breath and cough.  Sent from Dr. Lew Dawes office for admission.  Patient with CT scan of chest recently which demonstrated worsening lung malignancy and likely concurrent pneumonia.  Patient's symptoms most likely secondary to infection.  Cultures obtained.  Broad-spectrum antibiotics initiated in the ED.  Patient understands need for admission.  Hospitalist service is aware of case and will evaluate for same.  Final Clinical Impression(s) / ED Diagnoses Final diagnoses:  Dyspnea, unspecified type    Rx / DC Orders ED Discharge Orders     None        Valarie Merino, MD 02/07/21 [id number]

## 2021-02-07 NOTE — H&P (Signed)
Allison [name] VPX:[id number] DOB: 1950-11-26 DOA: 02/07/2021     PCP: Ronnell Freshwater, NP   Outpatient Specialists:   CARDS:  Dr.Schumann    Oncology  Dr.Mohamed GI Dr.  Collene Mares    Patient arrived to ER on 02/07/21 at 1538 Referred by Attending Valarie Merino, MD   Patient coming from: home Lives alone,    With family near by    Chief Complaint:   Chief Complaint  Patient presents with   Shortness of Breath    HPI: Allison [name] is a 70 y.o. female with medical history significant of   stage IV lung cancer since July 2019 only XRT, GERD, HTN  CAD dec 2022,  Presented with  fever up to 101, cough worsening SOB In the past steroids helped her symptoms Recent CT chest showed August 19th post obstructive PNA  Was prescribed steroids no ABX   Reports left leg tightness Reports chest pain worsening with inspiration  Infectious risk factors:  Reports fever, shortness of breath, dry cough,     Has   been vaccinated against COVID and boosted   Initial COVID TEST  NEGATIVE   Lab Results  Component Value Date   [place] 02/07/2021   [place] Not Detected 11/05/2020   [place] Not Detected 07/07/2020   [place] Not Detected 06/21/2020     Regarding pertinent Chronic problems:   Stage IV lung Ca  She completed her last radiation treatment in July, 2022  Hyperlipidemia - on statins Zetia Lipid Panel     Component Value Date/Time   CHOL 153 11/01/2020 0839   TRIG 77 11/01/2020 0839   HDL 64 11/01/2020 0839   CHOLHDL 2.4 11/01/2020 0839   CHOLHDL 4.1 06/13/2019 0100   VLDL 13 06/13/2019 0100   LDLCALC 74 11/01/2020 0839   LABVLDL 15 11/01/2020 0839     HTN on Norvasc, HCTZ     CAD  - On Aspirin, zetia ,BB  off Plavix due to GI bleed                -  followed by cardiology                - last cardiac cath dec 2020 Prox LAD lesion is 25% stenosed. Prox RCA lesion is 95% stenosed. A drug-eluting stent was successfully placed  using a SYNERGY XD [id number], postdilated to 2.5 mm. Post intervention, there is a 0% residual stenosis. The left ventricular systolic function is normal. LV end diastolic pressure is normal. The left ventricular ejection fraction is 55-65% by visual estimate. There is no aortic valve stenosis.       Chronic anemia - baseline hg Hemoglobin & Hematocrit  Recent Labs    12/27/20 1440 02/04/21 0928 02/07/21 1640  HGB 11.4* 11.8* 12.0     While in ER: Sats 90's on RA Tachy, febrile 101.3 imaging showing plural effusion Started on cefepime anad vanc Covid neg Trop 220 ECG wnl no CP     ED Triage Vitals  Enc Vitals Group     BP 02/07/21 1600 (!) 163/110     Pulse Rate 02/07/21 1600 (!) 109     Resp 02/07/21 1600 (!) 28     Temp 02/07/21 1600 (!) 101.3 F (38.5 C)     Temp Source 02/07/21 1600 Oral     SpO2 02/07/21 1600 98 %     Weight 02/07/21 1602 127 lb (57.6 kg)     Height 02/07/21 1602 5'  4" (1.626 m)     Head Circumference --      Peak Flow --      Pain Score 02/07/21 1601 8     Pain Loc --      Pain Edu? --      Excl. in [place]? --   TMAX(24)@     _________________________________________ Significant initial  Findings: Abnormal Labs Reviewed  COMPREHENSIVE METABOLIC PANEL - Abnormal; Notable for the following components:      Result Value   Glucose, Bld 105 (*)    All other components within normal limits  TROPONIN I (HIGH SENSITIVITY) - Abnormal; Notable for the following components:   Troponin I (High Sensitivity) 220 (*)    All other components within normal limits   ____________________________________________ Ordered    CXR - Moderate-sized right-sided pleural effusion,Moderate-sized right-sided pleural effusion, increased from prior. Patchy opacities throughout the right lung may reflect a combination of lymphangitic tumor spread and/or pneumonia.    CTA chest -small left lower lobe PE,   evidence of infiltrate possible lymphatic metastatic spread  could not rule out infection small right pleural effusion   _________________________ Troponin 220 ECG: Ordered Personally reviewed by me showing: HR : 104 Rhythm:  NSR,    no evidence of ischemic changes QTC 524 ____________________ This patient meets SIRS Criteria and may be septic.   The recent clinical data is shown below. Vitals:   02/07/21 1730 02/07/21 1745 02/07/21 1800 02/07/21 1830  BP: (!) 154/82 (!) 145/74 137/70 (!) 153/74  Pulse: (!) 104 89 92 91  Resp: (!) 24 (!) 28 (!) 23 16  Temp:    99.7 F (37.6 C)  TempSrc:    Oral  SpO2: 100% 97% 99% 100%  Weight:      Height:        WBC     Component Value Date/Time   WBC 9.2 02/07/2021 1640   LYMPHSABS 0.8 02/07/2021 1640   MONOABS 0.8 02/07/2021 1640   EOSABS 0.1 02/07/2021 1640   BASOSABS 0.0 02/07/2021 1640     Lactic Acid, Venous    Component Value Date/Time   LATICACIDVEN 1.3 02/07/2021 1640    Procalcitonin   Ordered        UA  no evidence of UTI      Urine analysis:    Component Value Date/Time   COLORURINE STRAW (A) 02/07/2021 2050   APPEARANCEUR CLEAR 02/07/2021 2050   LABSPEC 1.005 02/07/2021 2050   PHURINE 6.0 02/07/2021 2050   GLUCOSEU NEGATIVE 02/07/2021 2050   HGBUR MODERATE (A) 02/07/2021 2050   [place] NEGATIVE 02/07/2021 2050   [place] 02/07/2021 2050   PROTEINUR NEGATIVE 02/07/2021 2050   NITRITE NEGATIVE 02/07/2021 2050   LEUKOCYTESUR NEGATIVE 02/07/2021 2050    Results for orders placed or performed during the hospital encounter of 02/07/21  Resp Panel by RT-PCR (Flu A&B, Covid) Nasopharyngeal Swab     Status: None   Collection Time: 02/07/21  4:40 PM   Specimen: Nasopharyngeal Swab; Nasopharyngeal(NP) swabs in vial transport medium  Result Value Ref Range Status   SARS Coronavirus 2 by RT PCR NEGATIVE NEGATIVE Final         Influenza A by PCR NEGATIVE NEGATIVE Final   Influenza B by PCR NEGATIVE NEGATIVE Final           _______________________________________________ Hospitalist was called for admission for postobstructive PNA and new small pe  The following Work up has been ordered so far:  Orders Placed This Encounter  Procedures   Culture, blood (routine x 2)   Resp Panel by RT-PCR (Flu A&B, Covid) Nasopharyngeal Swab   DG Chest Port 1 View   Urinalysis, Routine w reflex microscopic   Comprehensive metabolic panel   CBC with Differential   Lactic acid, plasma   Protime-INR   Pharmacy Instructions To Nursing: Please monitor pt closely for any allergic reactions with first dose of cefepime   Nursing Communication Please obtain 12 lead ekg   Consult to hospitalist  Sob, fever, cough   ED EKG   Saline lock IV    Following Medications were ordered in ER: Medications  vancomycin (VANCOCIN) IVPB 1000 mg/200 mL premix (1,000 mg Intravenous New Bag/Given 02/07/21 1831)  sodium chloride 0.9 % bolus 500 mL (0 mLs Intravenous Stopped 02/07/21 1832)  methylPREDNISolone sodium succinate (SOLU-MEDROL) 125 mg/2 mL injection 125 mg (125 mg Intravenous Given 02/07/21 1710)  acetaminophen (TYLENOL) tablet 1,000 mg (1,000 mg Oral Given 02/07/21 1710)  ceFEPIme (MAXIPIME) 2 g in sodium chloride 0.9 % 100 mL IVPB (0 g Intravenous Stopped 02/07/21 1832)        Consult Orders  (From admission, onward)           Start     Ordered   02/07/21 1832  Consult to hospitalist  Sob, fever, cough  Once       Comments: Sob, fever, cough  Provider:  (Not yet assigned)  Question Answer Comment  Place call to: [facility]   Reason for Consult Admit      02/07/21 1831            OTHER Significant initial  Findings:  labs showing:    Recent Labs  Lab 02/04/21 0928 02/07/21 1640  NA 140 136  K 3.4* 3.6  CO2 29 25  GLUCOSE 87 105*  BUN 15 11  CREATININE 0.98 0.90  CALCIUM 9.8 9.5    Cr  stable,   Lab Results  Component Value Date   CREATININE 0.90 02/07/2021   CREATININE 0.98 02/04/2021    CREATININE 0.86 12/27/2020    Recent Labs  Lab 02/04/21 0928 02/07/21 1640  AST 30 39  ALT 27 31  ALKPHOS 57 52  BILITOT 0.5 0.8  PROT 7.1 7.4  ALBUMIN 3.5 3.7   Lab Results  Component Value Date   CALCIUM 9.5 02/07/2021   PHOS 4.3 06/16/2019    Plt: Lab Results  Component Value Date   PLT 162 02/07/2021      COVID-19 Labs  No results for input(s): DDIMER, FERRITIN, LDH, CRP in the last 72 hours.  Lab Results  Component Value Date   SARSCOV2NAA NEGATIVE 02/07/2021   [place] Not Detected 11/05/2020   [place] Not Detected 07/07/2020   [place] Not Detected 06/21/2020    Venous  Blood Gas result:  pH 7.422 pCO2 36.6       Recent Labs  Lab 02/04/21 0928 02/07/21 1640  WBC 3.6* 9.2  NEUTROABS 2.3 7.4  HGB 11.8* 12.0  HCT 36.2 37.3  MCV 80.1 81.6  PLT 115* 162    HG/HCT  stable,     Component Value Date/Time   HGB 12.0 02/07/2021 1640   HGB 11.8 (L) 02/04/2021 0928   HGB 11.6 07/03/2019 1153   HCT 37.3 02/07/2021 1640   HCT 36.0 07/03/2019 1153   MCV 81.6 02/07/2021 1640   MCV 81 07/03/2019 1153        Cultures:    Component Value Date/Time   SDES FLUID PLEURAL RIGHT  12/19/2017 1527   SDES FLUID PLEURAL RIGHT 12/19/2017 1527   SPECREQUEST NONE 12/19/2017 1527   SPECREQUEST NONE 12/19/2017 1527   CULT  12/19/2017 1527    NO GROWTH 5 DAYS Performed at [facility], [place] [address] [place], [place] [address]    REPTSTATUS 12/24/2017 FINAL 12/19/2017 1527   REPTSTATUS 12/20/2017 FINAL 12/19/2017 1527     Radiological Exams on Admission: CT Angio Chest Pulmonary Embolism (PE) W or WO Contrast  Result Date: 02/07/2021 CLINICAL DATA:  PE suspected, high prob. Dyspnea. Non-small cell lung cancer EXAM: CT ANGIOGRAPHY CHEST WITH CONTRAST TECHNIQUE: Multidetector CT imaging of the chest was performed using the standard protocol during bolus administration of intravenous contrast. Multiplanar CT image reconstructions and MIPs were  obtained to evaluate the vascular anatomy. CONTRAST:  31mL OMNIPAQUE IOHEXOL 350 MG/ML SOLN COMPARISON:  02/04/2021 FINDINGS: Cardiovascular: There is adequate opacification of the pulmonary arterial tree. There is a central intraluminal branching filling defect within the posterior basal segmental pulmonary artery of the left lower lobe in keeping with acute pulmonary embolism. The embolic burden is small. The central pulmonary arteries are not enlarged. There is no CT evidence of right heart strain. Mild coronary artery calcification. Global cardiac size within normal limits. No pericardial effusion. Mild atherosclerotic calcification is seen within the thoracic aorta. No aortic aneurysm. Mediastinum/Nodes: Rind like soft tissue along the paramediastinal pleural surface is indistinguishable from infiltrative soft tissue within the right paratracheal and subcarinal mediastinum though this may be in part related to radiographic technique. No pathologic thoracic adenopathy. The esophagus is unremarkable. Visualized thyroid unremarkable. Lungs/Pleura: Spiculated mass within the right apex is unchanged measuring 19 x 29 mm at axial image # 32/6. There is progressive consolidation within the a basilar right middle lobe and right lower lobe as well as irregular, nodular interlobular septal thickening in keeping with lymphangitic spread of malignancy. Superimposed infection within the right lung base is difficult to exclude. Small right pleural effusion is present. Rind like soft tissue involving the right hemithorax which demonstrates volume loss and compared to into the left may represent pleural metastatic disease but is not well characterized on this examination. Nodular, irregular septal thickening within the left upper lobe is not as well characterized on this examination due to motion artifact, but appears stable since prior examination, again compatible with changes of lymphangitic spread of malignancy. No  pneumothorax. No pleural effusion on the left. Central airways are patent. Upper Abdomen: No acute abnormality. Musculoskeletal: No lytic or blastic bone lesion. No acute bone abnormality. Review of the MIP images confirms the above findings. IMPRESSION: Acute pulmonary embolism. Embolic burden is small. No CT evidence of right heart strain. Progressive consolidation within the right lower lobe. While peribronchial consolidation likely represents a component of lymphangitic spread of malignancy, superimposed infection is difficult to exclude and is suspected given the relatively rapid progression of right lower lobe, in particular, consolidation. Small right pleural effusion. Rind like soft tissue within the right hemithorax with associated right-sided volume loss suspicious for pleural metastatic disease. Mild coronary artery calcification Aortic Atherosclerosis (ICD10-I70.0). Attempts are being made at this time to contact the managing clinician for direct verbal communication of these findings. Electronically Signed   By: Fidela Salisbury M.D.   On: 02/07/2021 21:35   DG Chest Port 1 View  Result Date: 02/07/2021 CLINICAL DATA:  Cough.  History of lung cancer EXAM: PORTABLE CHEST 1 VIEW COMPARISON:  02/04/2021 FINDINGS: Stable heart size. Irregular nodule again noted at the  right lung apex compatible with known malignancy. Moderate-sized right-sided pleural effusion, increased from prior. Patchy opacities throughout the right lung may reflect a combination of lymphangitic tumor spread and/or pneumonia. Mildly prominent interstitial markings in the left lung without focal airspace consolidation. No left-sided pleural effusion. No pneumothorax. IMPRESSION: 1. Moderate-sized right-sided pleural effusion, increased from prior. 2. Patchy opacities throughout the right lung may reflect a combination of lymphangitic tumor spread and/or pneumonia. Electronically Signed   By: Davina Poke D.O.   On: 02/07/2021 17:20    _______________________________________________________________________________________________________ Latest  Blood pressure (!) 153/74, pulse 91, temperature 99.7 F (37.6 C), temperature source Oral, resp. rate 16, height 5\' 4"  (1.626 m), weight 57.6 kg, SpO2 100 %.   Review of Systems:    Pertinent positives include:   Fevers, chills, fatigue,  Constitutional:  No weight loss, night sweats, weight loss  HEENT:  No headaches, Difficulty swallowing,Tooth/dental problems,Sore throat,  No sneezing, itching, ear ache, nasal congestion, post nasal drip,  Cardio-vascular:  No chest pain, Orthopnea, PND, anasarca, dizziness, palpitations.no Bilateral lower extremity swelling  GI:  No heartburn, indigestion, abdominal pain, nausea, vomiting, diarrhea, change in bowel habits, loss of appetite, melena, blood in stool, hematemesis Resp:  no shortness of breath at rest. No dyspnea on exertion, No excess mucus, no productive cough, No non-productive cough, No coughing up of blood.No change in color of mucus.No wheezing. Skin:  no rash or lesions. No jaundice GU:  no dysuria, change in color of urine, no urgency or frequency. No straining to urinate.  No flank pain.  Musculoskeletal:  No joint pain or no joint swelling. No decreased range of motion. No back pain.  Psych:  No change in mood or affect. No depression or anxiety. No memory loss.  Neuro: no localizing neurological complaints, no tingling, no weakness, no double vision, no gait abnormality, no slurred speech, no confusion  All systems reviewed and apart from [place] all are negative _______________________________________________________________________________________________ Past Medical History:   Past Medical History:  Diagnosis Date   Anxiety    Arthritis    Asthma    exercise induced   Depression    PMH   GERD (gastroesophageal reflux disease)    Glaucoma    History of radiation therapy 01/05/2021   IMRT right lung   11/24/2020-01/05/2021  Dr [name]   Hypertension    lung ca dx'd 11/2017   right   Malignant pleural effusion    right   PONV (postoperative nausea and vomiting)    Pre-diabetes    Raynaud's disease    Raynaud's disease       Past Surgical History:  Procedure Laterality Date   ABDOMINAL HYSTERECTOMY     partial   CHEST TUBE INSERTION Right 01/01/2018   Procedure: INSERTION PLEURAL DRAINAGE CATHETER;  Surgeon: Ivin Poot, MD;  Location: [facility];  Service: Thoracic;  Laterality: Right;   COLONOSCOPY     CORONARY STENT INTERVENTION N/A 06/16/2019   Procedure: CORONARY STENT INTERVENTION;  Surgeon: Jettie Booze, MD;  Location: [facility];  Service: Cardiovascular;  Laterality: N/A;   DILATION AND CURETTAGE OF UTERUS     EYE SURGERY     due to Glaucoma   LEFT HEART CATH AND CORONARY ANGIOGRAPHY N/A 06/16/2019   Procedure: LEFT HEART CATH AND CORONARY ANGIOGRAPHY;  Surgeon: Jettie Booze, MD;  Location: [facility];  Service: Cardiovascular;  Laterality: N/A;   REMOVAL OF PLEURAL DRAINAGE CATHETER Right 11/07/2018   Procedure: REMOVAL OF  PLEURAL DRAINAGE CATHETER;  Surgeon: Ivin Poot, MD;  Location: [facility];  Service: Thoracic;  Laterality: Right;   ROTATOR CUFF REPAIR     TUBAL LIGATION     WISDOM TOOTH EXTRACTION      Social History:  Ambulatory   independently        reports that she has never smoked. She has never used smokeless tobacco. She reports that she does not currently use alcohol. She reports that she does not use drugs.   Family History:   Family History  Problem Relation Age of Onset   Heart disease Sister    Heart disease Brother    Lung cancer Other    ______________________________________________________________________________________________ Allergies: Allergies  Allergen Reactions   Penicillins Other (See Comments)    [place] PATIENT HAS HAD A PCN REACTION WITH IMMEDIATE RASH, FACIAL/TONGUE/THROAT SWELLING,  SOB, OR LIGHTHEADEDNESS WITH HYPOTENSION:  #  #  YES  #  # Has patient had a PCN reaction causing severe rash involving mucus membranes or skin necrosis: No Has patient had a PCN reaction that required hospitalization: No Has patient had a PCN reaction occurring within the last 10 years: No    Vicodin [Hydrocodone-Acetaminophen] Other (See Comments)    Sped up heart and breathing   Other Other (See Comments)    Glaucoma eye drop     Prior to Admission medications   Medication Sig Start Date End Date Taking? Authorizing Provider  acetaminophen (TYLENOL) 500 MG tablet Take 1,000 mg by mouth every 6 (six) hours as needed (pain).   Yes [provider]  albuterol (VENTOLIN HFA) 108 (90 Base) MCG/ACT inhaler Inhale 2 puffs into the lungs every 6 (six) hours as needed for wheezing or shortness of breath. 02/02/21  Yes Boscia, Greer Ee, NP  ALPRAZolam (XANAX) 0.25 MG tablet Take 0.25 mg by mouth 2 (two) times daily as needed for anxiety.    Yes [provider]  amLODipine (NORVASC) 5 MG tablet Take 5 mg by mouth daily. 11/23/20  Yes [provider]  aspirin 81 MG chewable tablet Chew 1 tablet (81 mg total) by mouth daily. 06/18/19  Yes Georgette Shell, MD  b complex vitamins capsule Take 1 capsule by mouth daily.   Yes [provider]  carvedilol (COREG) 6.25 MG tablet TAKE 1 TABLET BY MOUTH 2 TIMES DAILY WITH A MEAL. PLEASE SCHEDULE APPOINTMENT FOR FUTURE REFILLS. Patient taking differently: Take 6.25 mg by mouth 2 (two) times daily with a meal. 12/08/20  Yes Donato Heinz, MD  cetirizine (ZYRTEC) 5 MG tablet Take 5 mg by mouth daily.   Yes [provider]  dorzolamide-timolol (COSOPT) 22.3-6.8 MG/ML ophthalmic solution Place 1 drop into both eyes 2 (two) times daily.   Yes [provider]  esomeprazole (NEXIUM) 20 MG packet Take 20 mg by mouth daily.   Yes [provider]  Evolocumab (REPATHA SURECLICK) 161 MG/ML SOAJ Inject  1 pen into the skin every 14 (fourteen) days. 09/20/20  Yes Donato Heinz, MD  ezetimibe (ZETIA) 10 MG tablet Take 10 mg by mouth daily.    Yes [provider]  hydrochlorothiazide (HYDRODIURIL) 25 MG tablet Take 25 mg by mouth daily.   Yes [provider]  latanoprost (XALATAN) 0.005 % ophthalmic solution Place 1 drop into both eyes at bedtime.  12/14/17  Yes [provider]  osimertinib mesylate (TAGRISSO) 80 MG tablet Take 1 tablet (80 mg total) by mouth daily. 09/14/20  Yes Curt Bears, MD  prochlorperazine (COMPAZINE) 10 MG tablet Take 1 tablet (10 mg total) by mouth every 6 (six) hours as needed for nausea or vomiting. 12/27/20  Yes Curt Bears, MD  VITAMIN D-VITAMIN K PO Take 1 tablet by mouth daily.   Yes [provider]  fluticasone (FLONASE) 50 MCG/ACT nasal spray Place 2 sprays into both nostrils daily. Patient not taking: Reported on 02/07/2021 11/17/20   Mayers, Loraine Grip, PA-C  methylPREDNIsolone (MEDROL DOSPACK) 4 MG tablet Follow package instructions Patient not taking: Reported on 02/07/2021 01/25/21   Eppie Gibson, MD  sucralfate (CARAFATE) 1 g tablet Take 1 tablet (1 g total) by mouth 4 (four) times daily -  with meals and at bedtime. Crush and dissolve in 10 mL of warm water prior to swallowing Patient not taking: Reported on 02/07/2021 12/16/20   [name], MD    ___________________________________________________________________________________________________ Physical Exam: Vitals with BMI 02/07/2021 02/07/2021 02/07/2021  Height - - -  Weight - - -  BMI - - -  Systolic 154 008 676  Diastolic 74 70 74  Pulse 91 92 89     1. General:  in    Acute distress increased work of breathing    Chronically ill    -appearing 2. Psychological: Alert and  Oriented 3. Head/ENT:   Dry Mucous Membranes                          Head Non traumatic, neck supple                          Poor Dentition 4. SKIN:  decreased Skin turgor,   Skin clean Dry and intact no rash 5. Heart: Regular rate and rhythm no  Murmur, no Rub or gallop 6. Lungs:  some wheezes or crackles   7. Abdomen: Soft,  non-tender,   8. Lower extremities: no clubbing, cyanosis, no  edema 9. Neurologically Grossly intact, moving all 4 extremities equally intact 10. MSK: Normal range of motion    Chart has been reviewed  ______________________________________________________________________________________________  Assessment/Plan 70 y.o. female with medical history significant of   stage IV lung cancer since July 2019 only XRT, GERD, HTN  CAD dec 2022,    Admitted for  postobstructive PNA and new small  PE  Present on Admission:  CAP (community acquired pneumonia) -  - will admit for treatment of CAP will start on appropriate antibiotic coverage. Suspect postobstructive PNA   Obtain:  sputum cultures,                 blood cultures                    strep pneumo UA antigen,                  Provide oxygen as needed.      Pleural effusion, right -small no indication for thoracentesis for now   Hypertension - allow permissive HTN for today   Hyperlipidemia - stable resume home meds   Glaucoma  - stable resume home meds   Adenocarcinoma of right lung, stage 4 (HCC) -as per oncology we will hold off on immunotherapy, emailed oncology patient has been admitted There is evidence of wheezing which is more likely secondary to sporadic spread in possible occlusion of some of the bronchi.  Patient has responded to steroids in the past we will continue as this seems to be helping  Pulmonary embolism ([place]) -  Admit to   telemetry   Initiate heparin drip  Would likely benefit from case manager consult for long term anticoagulation Hold home blood pressure medications avoid hypotension Cycle cardiac enzymes Order echogram and lower extremity Dopplers  Most likely risk factors for hypercoagulable state being malignancy       Elevated troponin in the  setting of PNA, pulmonary embolism hypoxia most likely demand ischemia will obtain echogram continue to cycle cardiac enzymes  History of CAD. -Resume statin.  Resume beta-blocker when able to tolerate continue aspirin   Other plan as per orders.  DVT prophylaxis: heparin    Code Status:    Code Status: Prior FULL CODE as per patient   I had personally discussed CODE STATUS with patient and family    Family Communication:   Family  at  Bedside  plan of care was discussed  with   Daughter,   Disposition Plan:       To home once workup is complete and patient is stable   Following barriers for discharge:                            PE treated                               able to transition to PO antibiotics                             Will need to be able to tolerate PO                            Will likely need home health, home O2, set up                           Will need consultants to evaluate patient prior to discharge                      Would benefit from PT/OT eval prior to DC  Ordered                    Transition of care consulted                                 Consults called:  email oncology and  Cardiology   Admission status:  ED Disposition     ED Disposition  [place]: [facility] [[id number]]  Level of Care: Telemetry [5]  Admit to tele based on following criteria: Monitor for Ischemic changes  May admit patient to [facility] or [facility] [place] if equivalent level of care is available:: No  Covid Evaluation: Confirmed COVID Negative  Diagnosis: CAP (community acquired pneumonia) [[id number]]  Admitting Physician: Toy Baker [[id number]]  Attending Physician: Toy Baker [[id number]]  Estimated length of stay: past midnight tomorrow  Certification:: I certify this patient will need inpatient services for at least 2 midnights            inpatient     I Expect 2 midnight stay secondary to  severity of patient's current illness need for inpatient interventions justified  by the following:  hemodynamic instability despite optimal treatment (  tachypnea  hypoxia,  )   Severe lab/radiological/exam abnormalities including:    CAp and pulmonary embolism  and extensive comorbidities including:  CAD   malignancy,    That are currently affecting medical management.   I expect  patient to be hospitalized for 2 midnights requiring inpatient medical care.  Patient is at high risk for adverse outcome (such as loss of life or disability) if not treated.  Indication for inpatient stay as follows:    Hemodynamic instability despite maximal medical therapy,     New or worsening hypoxia  Need for IV antibiotics, IV fluids,     Level of care     tele  For  24H         Lab Results  Component Value Date   [place] NEGATIVE 02/07/2021     Precautions: admitted as   Covid Negative   PPE: Used by the provider:   N95  eye Goggles,  Gloves     Azka Steger 02/07/2021, 10:29 PM    [facility]     after 2 AM please page floor coverage PA If 7AM-7PM, please contact the day team taking care of the patient using [email or web address]   Patient was evaluated in the context of the global COVID-19 pandemic, which necessitated consideration that the patient might be at risk for infection with the SARS-CoV-2 virus that causes COVID-19. Institutional protocols and algorithms that pertain to the evaluation of patients at risk for COVID-19 are in a state of rapid change based on information released by regulatory bodies including the CDC and federal and state organizations. These policies and algorithms were followed during the patient's care.

## 2021-02-08 ENCOUNTER — Inpatient Hospital Stay (HOSPITAL_COMMUNITY): Payer: Medicare HMO

## 2021-02-08 DIAGNOSIS — I2699 Other pulmonary embolism without acute cor pulmonale: Secondary | ICD-10-CM

## 2021-02-08 DIAGNOSIS — R059 Cough, unspecified: Secondary | ICD-10-CM

## 2021-02-08 DIAGNOSIS — I2609 Other pulmonary embolism with acute cor pulmonale: Secondary | ICD-10-CM | POA: Diagnosis not present

## 2021-02-08 DIAGNOSIS — I700 Atherosclerosis of aorta: Secondary | ICD-10-CM

## 2021-02-08 DIAGNOSIS — J9600 Acute respiratory failure, unspecified whether with hypoxia or hypercapnia: Secondary | ICD-10-CM

## 2021-02-08 DIAGNOSIS — C3490 Malignant neoplasm of unspecified part of unspecified bronchus or lung: Secondary | ICD-10-CM | POA: Diagnosis not present

## 2021-02-08 DIAGNOSIS — J9690 Respiratory failure, unspecified, unspecified whether with hypoxia or hypercapnia: Secondary | ICD-10-CM

## 2021-02-08 DIAGNOSIS — I82403 Acute embolism and thrombosis of unspecified deep veins of lower extremity, bilateral: Secondary | ICD-10-CM

## 2021-02-08 LAB — CBC WITH DIFFERENTIAL/PLATELET
Abs Immature Granulocytes: 0.03 10*3/uL (ref 0.00–0.07)
Basophils Absolute: 0 10*3/uL (ref 0.0–0.1)
Basophils Relative: 0 %
Eosinophils Absolute: 0 10*3/uL (ref 0.0–0.5)
Eosinophils Relative: 0 %
HCT: 34.4 % — ABNORMAL LOW (ref 36.0–46.0)
Hemoglobin: 11 g/dL — ABNORMAL LOW (ref 12.0–15.0)
Immature Granulocytes: 0 %
Lymphocytes Relative: 5 %
Lymphs Abs: 0.5 10*3/uL — ABNORMAL LOW (ref 0.7–4.0)
MCH: 26.1 pg (ref 26.0–34.0)
MCHC: 32 g/dL (ref 30.0–36.0)
MCV: 81.5 fL (ref 80.0–100.0)
Monocytes Absolute: 0.1 10*3/uL (ref 0.1–1.0)
Monocytes Relative: 1 %
Neutro Abs: 8.7 10*3/uL — ABNORMAL HIGH (ref 1.7–7.7)
Neutrophils Relative %: 94 %
Platelets: 161 10*3/uL (ref 150–400)
RBC: 4.22 MIL/uL (ref 3.87–5.11)
RDW: 14.8 % (ref 11.5–15.5)
WBC: 9.4 10*3/uL (ref 4.0–10.5)
nRBC: 0 % (ref 0.0–0.2)

## 2021-02-08 LAB — TROPONIN I (HIGH SENSITIVITY)
Troponin I (High Sensitivity): 184 ng/L (ref <18)
Troponin I (High Sensitivity): 149 ng/L (ref <18)

## 2021-02-08 LAB — COMPREHENSIVE METABOLIC PANEL
ALT: 28 U/L (ref 0–44)
AST: 34 U/L (ref 15–41)
Albumin: 3.3 g/dL — ABNORMAL LOW (ref 3.5–5.0)
Alkaline Phosphatase: 45 U/L (ref 38–126)
Anion gap: 8 (ref 5–15)
BUN: 14 mg/dL (ref 8–23)
CO2: 24 mmol/L (ref 22–32)
Calcium: 9.1 mg/dL (ref 8.9–10.3)
Chloride: 106 mmol/L (ref 98–111)
Creatinine, Ser: 0.96 mg/dL (ref 0.44–1.00)
GFR, Estimated: >60 mL/min (ref >60)
Glucose, Bld: 194 mg/dL — ABNORMAL HIGH (ref 70–99)
Potassium: 3.8 mmol/L (ref 3.5–5.1)
Sodium: 138 mmol/L (ref 135–145)
Total Bilirubin: 0.5 mg/dL (ref 0.3–1.2)
Total Protein: 6.8 g/dL (ref 6.5–8.1)

## 2021-02-08 LAB — ECHOCARDIOGRAM COMPLETE
Area-P 1/2: 3.72 cm2
Height: 64 in
MV M vel: 5.26 m/s
MV Peak grad: 110.7 mmHg
Radius: 0.35 cm
S' Lateral: 2.6 cm
Weight: 2032 oz

## 2021-02-08 LAB — TSH: TSH: 0.382 u[IU]/mL (ref 0.350–4.500)

## 2021-02-08 LAB — HEPARIN LEVEL (UNFRACTIONATED)
Heparin Unfractionated: 0.61 IU/mL (ref 0.30–0.70)
Heparin Unfractionated: 0.65 IU/mL (ref 0.30–0.70)

## 2021-02-08 LAB — STREP PNEUMONIAE URINARY ANTIGEN: Strep Pneumo Urinary Antigen: NEGATIVE

## 2021-02-08 LAB — BRAIN NATRIURETIC PEPTIDE: B Natriuretic Peptide: 35.4 pg/mL (ref 0.0–100.0)

## 2021-02-08 LAB — PHOSPHORUS: Phosphorus: 2.1 mg/dL — ABNORMAL LOW (ref 2.5–4.6)

## 2021-02-08 LAB — HIV ANTIBODY (ROUTINE TESTING W REFLEX): HIV Screen 4th Generation wRfx: NONREACTIVE

## 2021-02-08 LAB — MAGNESIUM: Magnesium: 2.1 mg/dL (ref 1.7–2.4)

## 2021-02-08 LAB — MRSA NEXT GEN BY PCR, NASAL: MRSA by PCR Next Gen: NOT DETECTED

## 2021-02-08 MED ORDER — CARVEDILOL 6.25 MG PO TABS
6.2500 mg | ORAL_TABLET | Freq: Two times a day (BID) | ORAL | Status: DC
  Administered 2021-02-08 – 2021-02-10 (×4): 6.25 mg via ORAL
  Filled 2021-02-08 (×4): qty 1

## 2021-02-08 MED ORDER — PREDNISONE 50 MG PO TABS
60.0000 mg | ORAL_TABLET | Freq: Every day | ORAL | Status: DC
  Administered 2021-02-09 – 2021-02-10 (×2): 60 mg via ORAL
  Filled 2021-02-08 (×2): qty 1

## 2021-02-08 MED ORDER — VANCOMYCIN HCL 750 MG/150ML IV SOLN
750.0000 mg | INTRAVENOUS | Status: DC
  Administered 2021-02-08: 750 mg via INTRAVENOUS
  Filled 2021-02-08 (×2): qty 150

## 2021-02-08 MED ORDER — TRAMADOL HCL 50 MG PO TABS
50.0000 mg | ORAL_TABLET | Freq: Four times a day (QID) | ORAL | Status: DC | PRN
  Administered 2021-02-09: 50 mg via ORAL
  Filled 2021-02-08: qty 1

## 2021-02-08 MED ORDER — BENZONATATE 100 MG PO CAPS
200.0000 mg | ORAL_CAPSULE | Freq: Three times a day (TID) | ORAL | Status: DC | PRN
  Administered 2021-02-08 – 2021-02-10 (×2): 200 mg via ORAL
  Filled 2021-02-08 (×2): qty 2

## 2021-02-08 MED ORDER — APIXABAN 5 MG PO TABS: 5.0000 mg | ORAL_TABLET | Freq: Two times a day (BID) | ORAL | Status: DC

## 2021-02-08 MED ORDER — BENZONATATE 100 MG PO CAPS
100.0000 mg | ORAL_CAPSULE | Freq: Three times a day (TID) | ORAL | Status: DC | PRN
  Administered 2021-02-08: 100 mg via ORAL
  Filled 2021-02-08: qty 1

## 2021-02-08 MED ORDER — APIXABAN 5 MG PO TABS
10.0000 mg | ORAL_TABLET | Freq: Two times a day (BID) | ORAL | Status: DC
  Administered 2021-02-08 – 2021-02-10 (×5): 10 mg via ORAL
  Filled 2021-02-08: qty 2
  Filled 2021-02-08: qty 4
  Filled 2021-02-08 (×3): qty 2

## 2021-02-08 MED ORDER — GUAIFENESIN-DM 100-10 MG/5ML PO SYRP
5.0000 mL | ORAL_SOLUTION | ORAL | Status: DC | PRN
  Administered 2021-02-08 – 2021-02-10 (×3): 5 mL via ORAL
  Filled 2021-02-08 (×3): qty 10

## 2021-02-08 MED ORDER — AMLODIPINE BESYLATE 5 MG PO TABS
5.0000 mg | ORAL_TABLET | Freq: Every day | ORAL | Status: DC
  Administered 2021-02-08 – 2021-02-10 (×3): 5 mg via ORAL
  Filled 2021-02-08 (×3): qty 1

## 2021-02-08 NOTE — Evaluation (Signed)
Physical Therapy Evaluation Patient Details Name: Allison [name] MRN: [id number] DOB: 09-06-1950 Today's Date: 02/08/2021   History of Present Illness  70 yo female admitted with Pna, acute PE, acute L LE DVT. Hx of stage IV lung cancer, CAD, MI, glaucoma  Clinical Impression  On eval, pt was Supv-Mod Ind with mobility. She walked ~150 feet. Dyspnea 2-3/4. Pt had difficulty speaking while ambulating. She c/o 8/10 chest tightness immediately after activity. O2 95% on RA, HR 108 bpm.   Lengthy discussion about various concerns during session. Pt expresses concerns about recent issues with power and projection when speaking. Discussed possibly getting SLP consult IF MD feels it is warranted-encouraged her to speak with MD about this. Also discussed f/u therapy: cardiopulmonary rehab vs outpatient PT. Pt seemed to be a little more interested in OP PT-reports weakness, balance deficits and decrease activity tolerance compared to baseline. She is hopeful to one day get back to [facility] the [facility]".   Encouraged pt to pose questions about other concerns to MD as well: if she should be using any breathing devices (flutter/spirometer), recommendations for diet, recommendations for f/u physicians after d/c.   Will plan to follow during hospital stay. Encouraged pt to mobilize as tolerated with consideration for energy conservation/rest as well.     Follow Up Recommendations Outpatient PT    Equipment Recommendations  None recommended by PT    Recommendations for Other Services Speech consult (if MD feels it is warranted)     Precautions / Restrictions Precautions Precautions: None Restrictions Weight Bearing Restrictions: No      Mobility  Bed Mobility Overal bed mobility: Modified Independent                  Transfers Overall transfer level: Modified independent                  Ambulation/Gait   Gait Distance (Feet): 150 Feet Assistive device: None Gait  Pattern/deviations: Step-through pattern     General Gait Details: Dyspnea 2-3/4. Pt had difficulty speaking while ambulating. O2 95% on RA. Mildly unsteady but no overt LOB.  Stairs            Wheelchair Mobility    Modified Rankin (Stroke Patients Only)       Balance Overall balance assessment: Mild deficits observed, not formally tested                                           Pertinent Vitals/Pain Pain Assessment: Faces Pain Score: 8  Pain Location: chest tightness with exertion. also has some R flank pain ("on the side where my cancer is") Pain Descriptors / Indicators: Discomfort;Tightness;Grimacing Pain Intervention(s): Limited activity within patient's tolerance;Monitored during session    Home Living Family/patient expects to be discharged to:: Private residence Living Arrangements: Alone Available Help at Discharge: Available PRN/intermittently (significant other) Type of Home: House Home Access: Stairs to enter     Home Layout: One level Home Equipment: None      Prior Function Level of Independence: Independent               Hand Dominance        Extremity/Trunk Assessment   Upper Extremity Assessment Upper Extremity Assessment: Overall WFL for tasks assessed         Cervical / Trunk Assessment Cervical / Trunk Assessment: Normal  Communication  Communication: Expressive difficulties (pt reports some issues with speech projection??)  Cognition Arousal/Alertness: Awake/alert Behavior During Therapy: WFL for tasks assessed/performed Overall Cognitive Status: Within Functional Limits for tasks assessed                                        General Comments      Exercises     Assessment/Plan    PT Assessment Patient needs continued PT services  PT Problem List Decreased strength;Decreased balance;Decreased activity tolerance;Cardiopulmonary status limiting activity       PT Treatment  Interventions Gait training;Functional mobility training;Patient/family education;Balance training;Therapeutic activities;Therapeutic exercise    PT Goals (Current goals can be found in the Care Plan section)  Acute Rehab PT Goals Patient Stated Goal: to get back to working out. to be able to speak with more power and projection PT Goal Formulation: With patient Time For Goal Achievement: 02/22/21 Potential to Achieve Goals: Good    Frequency Min 3X/week   Barriers to discharge        Co-evaluation               AM-PAC PT "6 Clicks" Mobility  Outcome Measure Help needed turning from your back to your side while in a flat bed without using bedrails?: None Help needed moving from lying on your back to sitting on the side of a flat bed without using bedrails?: None Help needed moving to and from a bed to a chair (including a wheelchair)?: None Help needed standing up from a chair using your arms (e.g., wheelchair or bedside chair)?: None Help needed to walk in hospital room?: None Help needed climbing 3-5 steps with a railing? : None 6 Click Score: 24    End of Session   Activity Tolerance: Patient limited by fatigue;Patient limited by pain Patient left: in bed;with call bell/phone within reach;with family/visitor present   PT Visit Diagnosis: Unsteadiness on feet (R26.81);Difficulty in walking, not elsewhere classified (R26.2)    Time: 3825-0539 PT Time Calculation (min) (ACUTE ONLY): 50 min   Charges:   PT Evaluation $PT Eval Moderate Complexity: 1 Mod PT Treatments $Gait Training: 8-22 mins $Self Care/Home Management: 8-22           Doreatha Massed, PT Acute Rehabilitation  Office: [phone] Pager: [phone]

## 2021-02-08 NOTE — Progress Notes (Signed)
OT Cancellation Note  Patient Details Name: Allison [name] MRN: [id number] DOB: 05-28-1951   Cancelled Treatment:    Reason Eval/Treat Not Completed: Other (comment). Pt with Acute pulmonary embolism with Heparin drip initiated on 02/07/21 at 22:57. Per therapeutic guidelines will hold OT until pt has been on Heparin for 24 hours and medically appropriate. Plan to reattempt tomorrow.   Tyrone Schimke, OT Acute Rehabilitation Services Pager: [phone] Office: [phone]  02/08/2021, 8:54 AM

## 2021-02-08 NOTE — Progress Notes (Signed)
HEMATOLOGY-ONCOLOGY PROGRESS NOTE  SUBJECTIVE: Allison [name] was seen in our office yesterday for follow-up, she was not feeling well and was having significant weakness and shortness of breath with chest tightness.  She was also febrile.  She was referred to the emergency department for further evaluation.  She had a CTA chest which showed acute PE with embolic burden that was small.  There was no evidence of right heart strain.  There was also progressive consolidation within the right lower lobe and while peribronchial consolidation likely represents a component of lymphangitic spread of malignancy, superimposed infection is difficult to exclude and suspected given relatively rapid progression of the right lower lobe.  She has been started on a heparin drip, IV antibiotics, and IV steroids.  She had Doppler ultrasound of her bilateral lower extremities performed earlier today which showed an acute DVT in the left lower extremity.  I saw the patient this morning and her daughter was at the bedside.  She reports that she is feeling much better.  She was up walking around in her room.  She reports swelling in her left lower extremity and some discomfort.  She is not having significant chest pain this morning.  She does report a cough which is bothersome.  Otherwise, she is feeling much better than she did yesterday.  Oncology History Overview Note  Patient presented with cough, congestion, SOB, fatigue, and wight loss.  Work up showed metastatic disease.    Adenocarcinoma of right lung, stage 4 (HCC)  12/11/2017 Imaging   CT Chest IMPRESSION: 1. Large right pleural effusion with volume loss of much of the right lung. 2. Right upper lobe spiculated mass of 2.3 x 2.9 cm suspicious for primary lung carcinoma. 3. Several small nodules throughout the right lung and left lung suspicious for metastases. Some of the nodules on the right are intimately associated with pleura and could represent  pleural metastases. 4. Questionable soft tissue nodule in the left upper abdomen which could represent a metastatic lesion. 5. Mixed lesion within the T9 vertebral body. Cannot exclude a metastatic process.   12/19/2017 Procedure   Thoracentesis   12/26/2017 Imaging   PET IMPRESSION: 1. Hypermetabolic spiculated 3.3 cm apical right upper lobe lung mass, compatible with primary bronchogenic adenocarcinoma. 2. Hypermetabolic right hilar and right paratracheal lymphadenopathy. 3. Moderate dependent right pleural effusion with mild right pleural thickening, compatible with known malignant right pleural effusion. 4. Hypermetabolic 1.6 cm anterior right lower lobe nodule associated with the major fissure, compatible with pulmonary metastasis. Numerous additional subcentimeter pulmonary nodules scattered in both lungs, below PET resolution, suspicious for pulmonary metastases. Recommend attention on follow-up chest CT in 3 months   01/04/2018 Initial Diagnosis   Adenocarcinoma of right lung, stage 4 (HCC)   01/10/2018 Imaging   MRI Brain  No evidence of intracranial metastases   01/29/2018 -  Chemotherapy   Oral biologic Tagrisso p.o. 80 mg    08/16/2020 Cancer Staging   Staging form: Lung, AJCC 8th Edition - Clinical: Stage IVA (cT2a, cN2, cM1a) - Signed by Si Gaul, MD on 08/16/2020      REVIEW OF SYSTEMS:   Constitutional: Denies fevers, chills  Eyes: Denies blurriness of vision Ears, nose, mouth, throat, and face: Denies mucositis or sore throat Respiratory: Reports cough Cardiovascular: Denies palpitation Gastrointestinal:  Denies nausea, heartburn or change in bowel habits Skin: Denies abnormal skin rashes Lymphatics: Denies new lymphadenopathy or easy bruising Neurological:Denies numbness, tingling or new weaknesses Behavioral/Psych: Mood is stable, no new changes  Extremities: No lower extremity edema  All other systems were reviewed with the patient and are  negative.  I have reviewed the past medical history, past surgical history, social history and family history with the patient and they are unchanged from previous note.   PHYSICAL EXAMINATION: ECOG PERFORMANCE STATUS: 1 - Symptomatic but completely ambulatory  Vitals:   02/07/21 2322 02/08/21 0449  BP: (!) 145/63 140/77  Pulse: 90 79  Resp: 18 20  Temp: 97.6 F (36.4 C) 97.8 F (36.6 C)  SpO2: 98% 98%   Filed Weights   02/07/21 1602  Weight: 57.6 kg    Intake/Output from previous day: 08/22 0701 - 08/23 0700 In: 475.4 [I.V.:175.4; IV Piggyback:300] Out: -   GENERAL:alert, no distress and comfortable SKIN: skin color, texture, turgor are normal, no rashes or significant lesions EYES: normal, Conjunctiva are pink and non-injected, sclera clear HEART: Left lower extremity with 1+ edema, skin is tight NEURO: alert & oriented x 3 with fluent speech, no focal motor/sensory deficits  LABORATORY DATA:  I have reviewed the data as listed CMP Latest Ref Rng & Units 02/08/2021 02/07/2021 02/04/2021  Glucose 70 - 99 mg/dL 194(H) 105(H) 87  BUN 8 - 23 mg/dL $Remove'14 11 15  'EjZExsF$ Creatinine 0.44 - 1.00 mg/dL 0.96 0.90 0.98  Sodium 135 - 145 mmol/L 138 136 140  Potassium 3.5 - 5.1 mmol/L 3.8 3.6 3.4(L)  Chloride 98 - 111 mmol/L 106 100 100  CO2 22 - 32 mmol/L $RemoveB'24 25 29  'kFCneegC$ Calcium 8.9 - 10.3 mg/dL 9.1 9.5 9.8  Total Protein 6.5 - 8.1 g/dL 6.8 7.4 7.1  Total Bilirubin 0.3 - 1.2 mg/dL 0.5 0.8 0.5  Alkaline Phos 38 - 126 U/L 45 52 57  AST 15 - 41 U/L 34 39 30  ALT 0 - 44 U/L $Remo'28 31 27    'uuhDp$ Lab Results  Component Value Date   WBC 9.4 02/08/2021   HGB 11.0 (L) 02/08/2021   HCT 34.4 (L) 02/08/2021   MCV 81.5 02/08/2021   PLT 161 02/08/2021   NEUTROABS 8.7 (H) 02/08/2021    CT Chest W Contrast  Result Date: 02/06/2021 CLINICAL DATA:  Non-small cell lung cancer staging, ongoing Tregresso EXAM: CT CHEST, ABDOMEN, AND PELVIS WITH CONTRAST TECHNIQUE: Multidetector CT imaging of the chest, abdomen  and pelvis was performed following the standard protocol during bolus administration of intravenous contrast. CONTRAST:  83mL OMNIPAQUE IOHEXOL 350 MG/ML SOLN, additional oral enteric contrast COMPARISON:  PET-CT, 11/09/2020 FINDINGS: CT CHEST FINDINGS Cardiovascular: Aortic atherosclerosis. Normal heart size. Right coronary artery calcifications and stents. No pericardial effusion. Mediastinum/Nodes: Slight interval decrease in size of prominent, although no longer pathologically enlarged mediastinal lymph nodes, index pretracheal node measuring 0.9 x 0.7 cm, previously 1.3 x 1.3 cm (series 2, image 20). Thyroid gland, trachea, and esophagus demonstrate no significant findings. Lungs/Pleura: Interval decrease in size of a mass of the right pulmonary apex, measuring 2.8 x 1.8 cm, previously 4.4 x 3.5 cm. 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 There are areas of significant consolidation in right middle and right lower lobes (series 6, image 80). No pleural effusion or pneumothorax. Musculoskeletal: No chest wall mass or suspicious bone lesions identified. CT ABDOMEN PELVIS FINDINGS Hepatobiliary: No solid liver abnormality is seen. No gallstones, gallbladder wall thickening, or biliary dilatation. Pancreas:  Unremarkable. No pancreatic ductal dilatation or surrounding inflammatory changes. Spleen: Normal in size without significant abnormality. Adrenals/Urinary Tract: Adrenal glands are unremarkable. Kidneys are normal, without renal calculi, solid lesion, or hydronephrosis. Bladder is unremarkable. Stomach/Bowel: Stomach is within normal limits. Appendix is not clearly visualized and may be surgically absent. No evidence of bowel wall thickening, distention, or inflammatory changes.  Vascular/Lymphatic: Aortic atherosclerosis. No enlarged abdominal or pelvic lymph nodes. Reproductive: Status post hysterectomy. Other: No abdominal wall hernia or abnormality. No abdominopelvic ascites. Musculoskeletal: No acute or significant osseous findings. IMPRESSION: 1. Interval decrease in size of a mass of the right pulmonary apex. 2. Slight interval decrease in size of prominent, previously FDG avid mediastinal lymph nodes. 3. 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 4. There are areas of significant consolidation in right middle and right lower lobes, which may reflect confluent metastatic disease, or alternately infectious, inflammatory, or postobstructive airspace disease. 5. Overall constellation of findings is consistent with mixed response to treatment. 6. No evidence of metastatic disease in the abdomen or pelvis. 7. Coronary artery disease. Aortic Atherosclerosis (ICD10-I70.0). Electronically Signed   By: Eddie Candle M.D.   On: 02/06/2021 14:23   CT Angio Chest Pulmonary Embolism (PE) W or WO Contrast  Addendum Date: 02/07/2021   ADDENDUM REPORT: 02/07/2021 22:40 ADDENDUM: These results were called by telephone at the time of interpretation on 02/07/2021 at 10:39 pm to provider [facility] , who verbally acknowledged these results. Electronically Signed   By: Fidela Salisbury M.D.   On: 02/07/2021 22:40   Result Date: 02/07/2021 CLINICAL DATA:  PE suspected, high prob. Dyspnea. Non-small cell lung cancer EXAM: CT ANGIOGRAPHY CHEST WITH CONTRAST TECHNIQUE: Multidetector CT imaging of the chest was performed using the standard protocol during bolus administration of intravenous contrast. Multiplanar CT image reconstructions and MIPs were obtained to evaluate the vascular anatomy.  CONTRAST:  88mL OMNIPAQUE IOHEXOL 350 MG/ML SOLN COMPARISON:  02/04/2021 FINDINGS: Cardiovascular: There is adequate opacification of the pulmonary arterial tree. There is a central intraluminal branching filling defect within the posterior basal segmental pulmonary artery of the left lower lobe in keeping with acute pulmonary embolism. The embolic burden is small. The central pulmonary arteries are not enlarged. There is no CT evidence of right heart strain. Mild coronary artery calcification. Global cardiac size within normal limits. No pericardial effusion. Mild atherosclerotic calcification is seen within the thoracic aorta. No aortic aneurysm. Mediastinum/Nodes: Rind like soft tissue along the paramediastinal pleural surface is indistinguishable from infiltrative soft tissue within the right paratracheal and subcarinal mediastinum though this may be in part related to radiographic technique. No pathologic thoracic adenopathy. The esophagus is unremarkable. Visualized thyroid unremarkable. Lungs/Pleura: Spiculated mass within the right apex is unchanged measuring 19 x 29 mm at axial image # 32/6. There is progressive consolidation within the a basilar right middle lobe and right lower lobe as well as irregular, nodular interlobular septal thickening in keeping with lymphangitic spread of malignancy. Superimposed infection within the right lung base is difficult to exclude. Small right pleural effusion is present. Rind like soft tissue involving the right hemithorax which demonstrates volume loss and compared to into the left may represent pleural metastatic disease but is not well characterized on this examination. Nodular, irregular septal thickening within the left upper lobe is not as well characterized on this  examination due to motion artifact, but appears stable since prior examination, again compatible with changes of lymphangitic spread of malignancy. No pneumothorax. No pleural effusion on the left.  Central airways are patent. Upper Abdomen: No acute abnormality. Musculoskeletal: No lytic or blastic bone lesion. No acute bone abnormality. Review of the MIP images confirms the above findings. IMPRESSION: Acute pulmonary embolism. Embolic burden is small. No CT evidence of right heart strain. Progressive consolidation within the right lower lobe. While peribronchial consolidation likely represents a component of lymphangitic spread of malignancy, superimposed infection is difficult to exclude and is suspected given the relatively rapid progression of right lower lobe, in particular, consolidation. Small right pleural effusion. Rind like soft tissue within the right hemithorax with associated right-sided volume loss suspicious for pleural metastatic disease. Mild coronary artery calcification Aortic Atherosclerosis (ICD10-I70.0). Attempts are being made at this time to contact the managing clinician for direct verbal communication of these findings. Electronically Signed: By: Fidela Salisbury M.D. On: 02/07/2021 21:35   CT Abdomen Pelvis W Contrast  Result Date: 02/06/2021 CLINICAL DATA:  Non-small cell lung cancer staging, ongoing Tregresso EXAM: CT CHEST, ABDOMEN, AND PELVIS WITH CONTRAST TECHNIQUE: Multidetector CT imaging of the chest, abdomen and pelvis was performed following the standard protocol during bolus administration of intravenous contrast. CONTRAST:  14mL OMNIPAQUE IOHEXOL 350 MG/ML SOLN, additional oral enteric contrast COMPARISON:  PET-CT, 11/09/2020 FINDINGS: CT CHEST FINDINGS Cardiovascular: Aortic atherosclerosis. Normal heart size. Right coronary artery calcifications and stents. No pericardial effusion. Mediastinum/Nodes: Slight interval decrease in size of prominent, although no longer pathologically enlarged mediastinal lymph nodes, index pretracheal node measuring 0.9 x 0.7 cm, previously 1.3 x 1.3 cm (series 2, image 20). Thyroid gland, trachea, and esophagus demonstrate no significant  findings. Lungs/Pleura: Interval decrease in size of a mass of the right pulmonary apex, measuring 2.8 x 1.8 cm, previously 4.4 x 3.5 cm. 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 There are areas of significant consolidation in right middle and right lower lobes (series 6, image 80). No pleural effusion or pneumothorax. Musculoskeletal: No chest wall mass or suspicious bone lesions identified. CT ABDOMEN PELVIS FINDINGS Hepatobiliary: No solid liver abnormality is seen. No gallstones, gallbladder wall thickening, or biliary dilatation. Pancreas: Unremarkable. No pancreatic ductal dilatation or surrounding inflammatory changes. Spleen: Normal in size without significant abnormality. Adrenals/Urinary Tract: Adrenal glands are unremarkable. Kidneys are normal, without renal calculi, solid lesion, or hydronephrosis. Bladder is unremarkable. Stomach/Bowel: Stomach is within normal limits. Appendix is not clearly visualized and may be surgically absent. No evidence of bowel wall thickening, distention, or inflammatory changes. Vascular/Lymphatic: Aortic atherosclerosis. No enlarged abdominal or pelvic lymph nodes. Reproductive: Status post hysterectomy. Other: No abdominal wall hernia or abnormality. No abdominopelvic ascites. Musculoskeletal: No acute or significant osseous findings. IMPRESSION: 1. Interval decrease in size of a mass of the right pulmonary apex. 2. Slight interval decrease in size of prominent, previously FDG avid mediastinal lymph nodes. 3. 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 4. There are areas of significant consolidation in right middle and right lower lobes, which may reflect confluent metastatic disease, or alternately infectious, inflammatory, or postobstructive airspace  disease. 5. Overall constellation of findings is consistent with mixed response to treatment. 6. No evidence of metastatic disease in the abdomen or pelvis. 7. Coronary artery disease. Aortic Atherosclerosis (ICD10-I70.0). Electronically Signed   By: Eddie Candle M.D.   On: 02/06/2021 14:23   DG Chest Port 1 View  Result Date: 02/07/2021 CLINICAL DATA:  Cough.  History of lung cancer EXAM: PORTABLE CHEST 1 VIEW COMPARISON:  02/04/2021 FINDINGS: Stable heart size. Irregular nodule again noted at the right lung apex compatible with known malignancy. Moderate-sized right-sided pleural effusion, increased from prior. Patchy opacities throughout the right lung may reflect a combination of lymphangitic tumor spread and/or pneumonia. Mildly prominent interstitial markings in the left lung without focal airspace consolidation. No left-sided pleural effusion. No pneumothorax. IMPRESSION: 1. Moderate-sized right-sided pleural effusion, increased from prior. 2. Patchy opacities throughout the right lung may reflect a combination of lymphangitic tumor spread and/or pneumonia. Electronically Signed   By: Davina Poke D.O.   On: 02/07/2021 17:20    ASSESSMENT AND PLAN: This is a very pleasant 70 year old never smoker female with stage IV non-small cell lung cancer, adenocarcinoma with positive EGFR mutation with deletion in exon 19.  She was started on treatment with Tagrisso 80 mg daily.  Recent restaging CT scan which showed some interval progression of the right apical lung mass in addition to progression of mediastinal lymphadenopathy concerning for worsening of her disease and she completed a course of palliative radiotherapy to the enlarging right upper lobe lung mass in addition to the  mediastinal lymphadenopathy under the care of Dr. Sondra Come which was completed on 01/05/2021.  She has been maintained on treatment with Tagrisso but more recently has developed worsening shortness of breath, chest tightness, cough, and fever.  Another restaging CT scan was performed and reviewed in our office yesterday which showed improvement in the size of the right apical lung mass as well as mediastinal lymphadenopathy but there was interval increase of the small right pleural effusion as well as clear evidence of lymphangitic carcinomatosis versus inflammatory process in the right middle and lower lobes.  She was not feeling well, cerebral.  Therefore, she was referred to the emergency department for further evaluation.  CTA of the chest was performed in the emergency department which showed a small PE and progressive consolidation in the right lower lobe-may represent lymphangitic spread versus infection.  Additionally, she was found to have a left lower extremity DVT.  She is currently receiving anticoagulation with heparin and has been started on IV antibiotics and IV steroids.  From our standpoint, we agree with proceeding with anticoagulation with heparin and then she may transition to oral anticoagulation with Xarelto or Eliquis.  Either option is fine from our standpoint.  We agree with treating her for possible pneumonia with IV antibiotics and then transitioning to p.o. once her symptoms improved.  We would also recommend treating her for possible pneumonitis with a long, slow taper of the steroids.  She should be placed on prednisone 1 mg/kg or equivalent daily and should discharged home on this medication.  We will taper slowly as an outpatient.  I will switch her from IV Solu-Medrol to prednisone 60 mg daily starting tomorrow.  For cough, I have ordered guaifenesin with dextromethorphan and Tessalon Perles.  Neither have been terribly effective for her recently, but hopefully with treatment  of her pneumonia and possible pneumonitis, her symptoms will improve.  We discussed other options for treatment of cough, but she does not tolerate  codeine products and we will avoid these.  For now, we will hold Tagrisso.  I will schedule follow-up for her and 1 to 2 weeks with Dr. Julien Nordmann to discuss treatment options and for tapering of her prednisone.  The total time spent in the visit was 30 minutes. Greater than 50%  of this time was spent counseling and coordinating care related to the above assessment and plan.     LOS: 1 day   Mikey Bussing, DNP, AGPCNP-BC, AOCNP 02/08/21

## 2021-02-08 NOTE — Assessment & Plan Note (Signed)
--   Continue empiric antibiotics, no fever, procalcitonin negative, no hypoxia but does have significant increase in respiratory effort and tires quickly when talking.

## 2021-02-08 NOTE — Assessment & Plan Note (Signed)
--   Anticoagulation

## 2021-02-08 NOTE — Progress Notes (Signed)
Venous duplex has been completed.  Preliminary findings given to Joellen Jersey, Therapist, sports.  Results can be found under chart review under CV PROC. 02/08/2021 12:01 PM Karlynn Furrow RVT, RDMS

## 2021-02-08 NOTE — Progress Notes (Addendum)
Pharmacy Antibiotic Note  Allison [name] is a 70 y.o. female presented to the ED on 02/07/2021 with c/o SOB and fever. CXR showed findings with concern for PNA. Pharmacy has been consulted to dose cefepime and vancomycin for PNA.   Plan: -Received vancomycin 1000mg  IV x1 8/22 in ED -Vancomycin 750 mg IV q24 hours (eAUC 415, Scr 0.96, Vd 0.7) -Continue cefepime 2gm q12h -F/u MRSA PCR and de-escalate antibiotics as necessary -Monitor clinical improvement, renal function, vanc levels as necessary  ______________________________________  Height: 5\' 4"  (162.6 cm) Weight: 57.6 kg (127 lb) IBW/kg (Calculated) : 54.7  Temp (24hrs), Avg:99.2 F (37.3 C), Min:97.6 F (36.4 C), Max:101.3 F (38.5 C)  Recent Labs  Lab 02/04/21 0928 02/07/21 1640 02/07/21 1838 02/08/21 0154  WBC 3.6* 9.2  --  9.4  CREATININE 0.98 0.90  --  0.96  LATICACIDVEN  --  1.3 1.1  --      Estimated Creatinine Clearance: 47.1 mL/min (by C-G formula based on SCr of 0.96 mg/dL).    Allergies  Allergen Reactions   Penicillins Other (See Comments)    SYNCOPE PATIENT HAS HAD A PCN REACTION WITH IMMEDIATE RASH, FACIAL/TONGUE/THROAT SWELLING, SOB, OR LIGHTHEADEDNESS WITH HYPOTENSION:  #  #  YES  #  # Has patient had a PCN reaction causing severe rash involving mucus membranes or skin necrosis: No Has patient had a PCN reaction that required hospitalization: No Has patient had a PCN reaction occurring within the last 10 years: No    Vicodin [Hydrocodone-Acetaminophen] Other (See Comments)    Sped up heart and breathing   Other Other (See Comments)    Glaucoma eye drop   Antimicrobials this Admission: Cefepime 8/22 >> Vancomycin 8/22 >>  Dose Adjustments: N/A  Microbiology Data: 8/22 Bcx: ngtd 8/23 MRSA PCR: ordered   Thank you for allowing pharmacy to be a part of this patient's care.  Dimple Nanas, PharmD 02/08/2021 2:18 PM

## 2021-02-08 NOTE — Progress Notes (Signed)
  Echocardiogram 2D Echocardiogram has been performed.  Allison [name] M 02/08/2021, 3:28 PM

## 2021-02-08 NOTE — Assessment & Plan Note (Signed)
--  continue ezetimibe

## 2021-02-08 NOTE — Progress Notes (Addendum)
PROGRESS NOTE  Allison [name] JSE:[id number] DOB: [birth year] DOA: 02/07/2021 PCP: Ronnell Freshwater, NP  Brief History   70 year old woman PMH including stage IV lung cancer treated with XRT presented with fever, cough, shortness of breath.  Admitted for postobstructive pneumonia and pulmonary embolism.   A & P  * Respiratory failure (HCC) -- No hypoxia but symptomatic, short of breath even when speaking.  Differential includes postobstructive pneumonia, lymphangitic spread of known lung cancer, radiation injury, drug injury. -- Treat with antibiotics, treat PE (small clot burden not thought to be contributory), steroids.  Discussed with pulmonology, check Legionella, sed rate, respiratory panel, pulmonology will see tomorrow.  Pulmonary embolism (HCC) -- Small clot burden on CT with no evidence of RV strain, hemodynamics stable -- Discussed anticoagulation options with patient and daughter, elected for apixaban will transition today  Postobstructive pneumonia -- Continue empiric antibiotics, no fever, procalcitonin negative, no hypoxia but does have significant increase in respiratory effort and tires quickly when talking.  Leg DVT (deep venous thromboembolism), acute, bilateral (HCC) -- Anticoagulation  Elevated troponin -- Suspect demand ischemia, most likely related to pneumonia, lung cancer, CT without evidence of significant clot burden, however will check echocardiogram.  At this point no signs or symptoms to suggest ACS.  Doubt RV strain. -- check echo  Adenocarcinoma of right lung, stage 4 ([place]) -- Appreciate oncology evaluation.  Aortic atherosclerosis (HCC) --continue ezetimibe  Replace phosphorus  Disposition Plan:  Discussion: change to apixaban, continue IV abx  Status is: Inpatient  Remains inpatient appropriate because:IV treatments appropriate due to intensity of illness or inability to take PO and Inpatient level of care appropriate due to severity of  illness  Dispo: The patient is from: Home              Anticipated d/c is to: Home              Patient currently is not medically stable to d/c.   Difficult to place patient No  DVT prophylaxis: SCDs Start: 02/07/21 2026 apixaban (ELIQUIS) tablet 10 mg  apixaban (ELIQUIS) tablet 5 mg    Code Status: Full Code Level of care: Telemetry Family Communication: daughter at bedside  Murray Hodgkins, MD  [facility] Direct contact: see [email or web address] (further directions at bottom of note if needed) 7PM-7AM contact night coverage as at bottom of note 02/08/2021, 2:27 PM  LOS: 1 day   Significant Hospital Events   8/22 admit for PE, pneumonia   Consults:  Oncology    Procedures:  None   Significant Diagnostic Tests:  8/23 CTA chest: acute PE, small burden, no RV strain, progressive consolidation RLL   Micro Data:  BC NGTD   Antimicrobials:  Cefepime 8/22 > Vancomycin 8/22 >  Interval History/Subjective  CC: f/u SOB  Feels okay at rest but short of breath with moving and even with talking.  The symptoms have progressed over the last 14 days.  Objective   Vitals:  Vitals:   02/07/21 2322 02/08/21 0449  BP: (!) 145/63 140/77  Pulse: 90 79  Resp: 18 20  Temp: 97.6 F (36.4 C) 97.8 F (36.6 C)  SpO2: 98% 98%    Exam: Physical Exam Vitals reviewed.  Constitutional:      General: She is not in acute distress.    Appearance: She is ill-appearing. She is not toxic-appearing.  Cardiovascular:     Rate and Rhythm: Normal rate and regular rhythm.     Heart sounds: No murmur heard. Pulmonary:  Effort: No respiratory distress.     Breath sounds: No wheezing, rhonchi or rales.     Comments: Moderate increased respiratory effort Musculoskeletal:     Right lower leg: No edema.     Left lower leg: No edema.  Neurological:     Mental Status: She is alert.  Psychiatric:        Mood and Affect: Mood normal.        Behavior: Behavior normal.    I have personally  reviewed the labs and other data, making special note of:   Today's Data  Troponins 220, 206, 184, 149 Procalcitonin negative Phosphorus 2.1   Scheduled Meds:  amLODipine  5 mg Oral Daily   apixaban  10 mg Oral BID   Followed by   Derrill Memo ON 02/15/2021] apixaban  5 mg Oral BID   aspirin  81 mg Oral Daily   carvedilol  6.25 mg Oral BID WC   dorzolamide-timolol  1 drop Both Eyes BID   ezetimibe  10 mg Oral QAC breakfast   latanoprost  1 drop Both Eyes QHS   loratadine  10 mg Oral Daily   methylPREDNISolone (SOLU-MEDROL) injection  40 mg Intravenous Q12H   pantoprazole  40 mg Oral Daily   [START ON 02/09/2021] predniSONE  60 mg Oral Q breakfast   Continuous Infusions:  ceFEPime (MAXIPIME) IV 2 g (02/08/21 0444)   vancomycin      Principal Problem:   Respiratory failure (HCC) Active Problems:   Postobstructive pneumonia   Pulmonary embolism (HCC)   Adenocarcinoma of right lung, stage 4 (HCC)   Elevated troponin   Leg DVT (deep venous thromboembolism), acute, bilateral ([place])   Hypertension   Glaucoma   Aortic atherosclerosis ([place])   LOS: 1 day   How to contact the [facility] Attending or Consulting provider 7A - 7P or covering provider during after hours [place], for this patient?  Check the care team in [facility] and look for a) attending/consulting [facility] provider listed and b) the [facility] team listed Log into [email or web address] and use [place]'s universal password to access. If you do not have the password, please contact the hospital operator. Locate the [facility] provider you are looking for under [facility] and page to a number that you can be directly reached. If you still have difficulty reaching the provider, please page the [facility] (Director on Call) for the Hospitalists listed on amion for assistance.

## 2021-02-08 NOTE — Assessment & Plan Note (Signed)
--   Small clot burden on CT with no evidence of RV strain, hemodynamics stable -- Discussed anticoagulation options with patient and daughter, elected for apixaban will transition today

## 2021-02-08 NOTE — Hospital Course (Addendum)
70 year old woman PMH including stage IV lung cancer treated with XRT presented with fever, cough, shortness of breath.  Admitted for postobstructive pneumonia and pulmonary embolism.

## 2021-02-08 NOTE — Assessment & Plan Note (Signed)
--   Appreciate oncology evaluation.

## 2021-02-08 NOTE — Progress Notes (Addendum)
ANTICOAGULATION CONSULT NOTE - Initial Consult  Pharmacy Consult for heparin Indication: pulmonary embolus  Allergies  Allergen Reactions   Penicillins Other (See Comments)    SYNCOPE PATIENT HAS HAD A PCN REACTION WITH IMMEDIATE RASH, FACIAL/TONGUE/THROAT SWELLING, SOB, OR LIGHTHEADEDNESS WITH HYPOTENSION:  #  #  YES  #  # Has patient had a PCN reaction causing severe rash involving mucus membranes or skin necrosis: No Has patient had a PCN reaction that required hospitalization: No Has patient had a PCN reaction occurring within the last 10 years: No    Vicodin [Hydrocodone-Acetaminophen] Other (See Comments)    Sped up heart and breathing   Other Other (See Comments)    Glaucoma eye drop    Patient Measurements: Height: 5\' 4"  (162.6 cm) Weight: 57.6 kg (127 lb) IBW/kg (Calculated) : 54.7 Heparin Dosing Weight: 57.6kg  Vital Signs: Temp: 97.8 F (36.6 C) (08/23 0449) Temp Source: Oral (08/23 0449) BP: 140/77 (08/23 0449) Pulse Rate: 79 (08/23 0449)  Labs: Recent Labs    02/07/21 1640 02/07/21 1838 02/07/21 2057 02/07/21 2314 02/08/21 0154 02/08/21 0704  HGB 12.0  --   --   --  11.0*  --   HCT 37.3  --   --   --  34.4*  --   PLT 162  --   --   --  161  --   LABPROT 14.0  --   --   --   --   --   INR 1.1  --   --   --   --   --   HEPARINUNFRC  --   --   --   --  0.61 0.65  CREATININE 0.90  --   --   --  0.96  --   CKTOTAL  --   --  62  --   --   --   TROPONINIHS 220* 206*  --  184* 149*  --      Estimated Creatinine Clearance: 47.1 mL/min (by C-G formula based on SCr of 0.96 mg/dL).   Medical History: Past Medical History:  Diagnosis Date   Anxiety    Arthritis    Asthma    exercise induced   Depression    PMH   GERD (gastroesophageal reflux disease)    Glaucoma    History of radiation therapy 01/05/2021   IMRT right lung  11/24/2020-01/05/2021  Dr [name]   Hypertension    lung ca dx'd 11/2017   right   Malignant pleural effusion    right    PONV (postoperative nausea and vomiting)    Pre-diabetes    Raynaud's disease    Raynaud's disease     Assessment: 70 yo female with known hx of lung cancer, presents with increasing cough and SOB over the last 2 weeks.  Pharmacy consulted to dose heparin for PE.  No prior AC noted  CBC WNL and stable Scr 0.96, CrCl ~ 47.1 mls/min Baseline aPTT/INR WNL  First HL drawn ~3 hours early at 0100 - reentered for 0700.  Repeat HL therapeutic at 0.65.  No issues with line running or bleeding per RN.   Goal of Therapy:  Heparin level 0.3-0.7 units/ml Monitor platelets by anticoagulation protocol: Yes   Plan:  Continue heparin drip at 950 units/hr (may require slight dose reduction in the future since at higher end of HL goal) Heparin level in 8 hours Daily CBC F/u ability to transition to oral anticoagulant  Dimple Nanas, PharmD 02/08/2021 7:49  AM  PM UPDATE: -Pharmacy consulted to switch heparin to apixaban for VTE treatment -Discontinue heparin drip -Begin apixaban 10mg  PO BID x 7 days, followed by 5mg  PO BID thereafter -Monitor s/sx bleeding  Dimple Nanas, PharmD 02/08/2021 1:09 PM

## 2021-02-08 NOTE — Assessment & Plan Note (Signed)
--   No hypoxia but symptomatic, short of breath even when speaking.  Differential includes postobstructive pneumonia, lymphangitic spread of known lung cancer, radiation injury, drug injury. -- Treat with antibiotics, treat PE (small clot burden not thought to be contributory), steroids.  Discussed with pulmonology, check Legionella, sed rate, respiratory panel, pulmonology will see tomorrow.

## 2021-02-08 NOTE — Progress Notes (Signed)
PT Cancellation Note  Patient Details Name: Allison [name] MRN: [id number] DOB: 07/09/1950   Cancelled Treatment:    Reason Eval/Treat Not Completed: Patient at procedure or test/unavailable. Will check back as schedule allows. Thanks.    [facility]  Office: [phone] Pager: [phone]

## 2021-02-08 NOTE — Assessment & Plan Note (Addendum)
--   Suspect demand ischemia, most likely related to pneumonia, lung cancer, CT without evidence of significant clot burden, however will check echocardiogram.  At this point no signs or symptoms to suggest ACS.  Doubt RV strain. -- check echo

## 2021-02-09 DIAGNOSIS — C3491 Malignant neoplasm of unspecified part of right bronchus or lung: Secondary | ICD-10-CM

## 2021-02-09 DIAGNOSIS — R0600 Dyspnea, unspecified: Secondary | ICD-10-CM

## 2021-02-09 DIAGNOSIS — R059 Cough, unspecified: Secondary | ICD-10-CM | POA: Diagnosis not present

## 2021-02-09 LAB — RESPIRATORY PANEL BY PCR

## 2021-02-09 LAB — LEGIONELLA PNEUMOPHILA SEROGP 1 UR AG: L. pneumophila Serogp 1 Ur Ag: NEGATIVE

## 2021-02-09 LAB — SEDIMENTATION RATE: Sed Rate: 3 mm/hr (ref 0–22)

## 2021-02-09 MED ORDER — POTASSIUM PHOSPHATES 15 MMOLE/5ML IV SOLN
10.0000 mmol | Freq: Once | INTRAVENOUS | Status: AC
  Administered 2021-02-09: 10 mmol via INTRAVENOUS
  Filled 2021-02-09: qty 3.33

## 2021-02-09 MED ORDER — HYDROCHLOROTHIAZIDE 25 MG PO TABS
25.0000 mg | ORAL_TABLET | Freq: Every day | ORAL | Status: DC
  Administered 2021-02-09 – 2021-02-10 (×2): 25 mg via ORAL
  Filled 2021-02-09 (×2): qty 1

## 2021-02-09 NOTE — Progress Notes (Signed)
SATURATION QUALIFICATIONS: (This note is used to comply with regulatory documentation for home oxygen)  Patient Saturations on Room Air at Rest 100%  Patient Saturations on Room Air while Ambulating = 100%  Patient Saturations on 0 Liters of oxygen while Ambulating = n/a%  Please briefly explain why patient needs home oxygen:

## 2021-02-09 NOTE — Plan of Care (Signed)

## 2021-02-09 NOTE — Evaluation (Signed)
Occupational Therapy Evaluation Patient Details Name: Allison [name] MRN: [id number] DOB: 1951-05-29 Today's Date: 02/09/2021    History of Present Illness 70 yo female admitted with Pna, acute PE, acute L LE DVT. Hx of stage IV lung cancer, CAD, MI, glaucoma   Clinical Impression   Patient evaluated by Occupational Therapy with no further acute OT needs identified. All education has been completed and the patient has no further questions. Patient completed shower transfer in bathroom, functional mobility, LB dressing task with I. Patient is at baseline for ADLs at this time. No LOB noted. Patient endorses being at baseline at this time.  See below for any follow-up Occupational Therapy or equipment needs. OT is signing off. Thank you for this referral.     Follow Up Recommendations  No OT follow up    Equipment Recommendations  None recommended by OT    Recommendations for Other Services       Precautions / Restrictions Precautions Precautions: None Restrictions Weight Bearing Restrictions: No      Mobility Bed Mobility Overal bed mobility: Modified Independent                  Transfers Overall transfer level: Modified independent                    Balance Overall balance assessment: Modified Independent                                         ADL either performed or assessed with clinical judgement   ADL Overall ADL's : Independent                                       General ADL Comments: patient was standing in room to set up breakfast with patient having taken herself to the bathroom on this date. patient reportd having walked in hallway with NT earlier this AM. patient was able to complete LB dressing, shower transfer in bathroom and functional mobility with indepence with no assistive device. patient was educated on Network engineer. patient verbalized understanding.     Vision Patient Visual  Report: No change from baseline       Perception     Praxis      Pertinent Vitals/Pain Pain Assessment: Faces Faces Pain Scale: Hurts little more Pain Location: right sided pain in abdomen/chest area. Pain Descriptors / Indicators: Guarding;Discomfort;Grimacing Pain Intervention(s): Limited activity within patient's tolerance;Monitored during session;RN gave pain meds during session     Hand Dominance Right   Extremity/Trunk Assessment Upper Extremity Assessment Upper Extremity Assessment: Overall WFL for tasks assessed   Lower Extremity Assessment Lower Extremity Assessment: Defer to PT evaluation   Cervical / Trunk Assessment Cervical / Trunk Assessment: Normal   Communication Communication Communication: Expressive difficulties   Cognition Arousal/Alertness: Awake/alert Behavior During Therapy: WFL for tasks assessed/performed Overall Cognitive Status: Within Functional Limits for tasks assessed                                     General Comments       Exercises     Shoulder Instructions      Home Living Family/patient expects to be discharged to:: Private residence  Living Arrangements: Alone Available Help at Discharge: Available PRN/intermittently Type of Home: House Home Access: Stairs to enter     Home Layout: One level     Bathroom Shower/Tub: Walk-in shower (with built in shower seat)         Home Equipment: None          Prior Functioning/Environment Level of Independence: Independent                 OT Problem List:        OT Treatment/Interventions:      OT Goals(Current goals can be found in the care plan section) Acute Rehab OT Goals Patient Stated Goal: to get back to classes at y OT Goal Formulation: All assessment and education complete, DC therapy  OT Frequency:     Barriers to D/C:            Co-evaluation              AM-PAC OT "6 Clicks" Daily Activity     Outcome Measure Help from  another person eating meals?: None Help from another person taking care of personal grooming?: None Help from another person toileting, which includes using toliet, bedpan, or urinal?: None Help from another person bathing (including washing, rinsing, drying)?: None Help from another person to put on and taking off regular upper body clothing?: None Help from another person to put on and taking off regular lower body clothing?: None 6 Click Score: 24   End of Session Nurse Communication: Mobility status  Activity Tolerance: Patient tolerated treatment well Patient left: in chair;with call bell/phone within reach  OT Visit Diagnosis: Pain Pain - Right/Left: Right Pain - part of body:  (abdomen/chest)                Time: 4403-4742 OT Time Calculation (min): 17 min Charges:  OT General Charges $OT Visit: 1 Visit OT Evaluation $OT Eval Low Complexity: 1 Low  Jackelyn Poling OTR/L, MS Acute Rehabilitation Department Office# [phone] Pager# [place] 02/09/2021, 9:16 AM

## 2021-02-09 NOTE — Discharge Instructions (Signed)
Information on my medicine - ELIQUIS (apixaban)  Why was Eliquis prescribed for you? Eliquis was prescribed to treat blood clots that may have been found in the veins of your legs (deep vein thrombosis) or in your lungs (pulmonary embolism) and to reduce the risk of them occurring again.  What do You need to know about Eliquis ? The starting dose is 10 mg (two 5 mg tablets) taken TWICE daily for the FIRST SEVEN (7) DAYS, then on 02/15/21 the dose is reduced to ONE 5 mg tablet taken TWICE daily.  Eliquis may be taken with or without food.   Try to take the dose about the same time in the morning and in the evening. If you have difficulty swallowing the tablet whole please discuss with your pharmacist how to take the medication safely.  Take Eliquis exactly as prescribed and DO NOT stop taking Eliquis without talking to the doctor who prescribed the medication.  Stopping may increase your risk of developing a new blood clot.  Refill your prescription before you run out.  After discharge, you should have regular check-up appointments with your healthcare provider that is prescribing your Eliquis.    What do you do if you miss a dose? If a dose of ELIQUIS is not taken at the scheduled time, take it as soon as possible on the same day and twice-daily administration should be resumed. The dose should not be doubled to make up for a missed dose.  Important Safety Information A possible side effect of Eliquis is bleeding. You should call your healthcare provider right away if you experience any of the following: Bleeding from an injury or your nose that does not stop. Unusual colored urine (red or dark brown) or unusual colored stools (red or black). Unusual bruising for unknown reasons. A serious fall or if you hit your head (even if there is no bleeding).  Some medicines may interact with Eliquis and might increase your risk of bleeding or clotting while on Eliquis. To help avoid this,  consult your healthcare provider or pharmacist prior to using any new prescription or non-prescription medications, including herbals, vitamins, non-steroidal anti-inflammatory drugs (NSAIDs) and supplements.  This website has more information on Eliquis (apixaban): http://[email or web address].com/eliquis/home

## 2021-02-09 NOTE — Consult Note (Signed)
NAME:  Allison [name], MRN:  [id number], DOB:  [birth year], LOS: 2 ADMISSION DATE:  02/07/2021, CONSULTATION DATE:  02/09/21 REFERRING MD:  Dr Rhetta Mura and Sarajane Jews, CHIEF COMPLAINT:  Pneumonitis v Pneumonia v Lymphangitis  PCP Ronnell Freshwater, NP Onc: Dr Julien Nordmann GI: Dr Collene Mares Cards: Dr Gardiner Rhyme  History of Present Illness:   70-year-old African-American female stage IV lung cancer as described below.  Followed by Dr. Julien Nordmann.  She had a most recent CT scan of the chest 02/04/2021.  On the CT scan she had decrease in the right upper lobe mass and mediastinal adenopathy compared to May 2022 but increasing right-sided pleural effusion but most importantly she had bilateral mid zone consolidation [could be radiation pneumonitis from July 2022] and also definite evidence of lymphangitis new compared to May 2022 [in July 2021 she only had right upper lobe mass that was most evident on the CT] at this visit she complained about not feeling well and febrile was admitted.  In addition the CT angiogram chest showed evidence of small pulmonary embolism.  She also has acute DVT.  She is on anticoagulation for this in the hhospital  In talking to the patient she tells me she has chronic cough since May 2022.  The cough is more in the daytime.  Less at night.  A previous course of prednisone for poison ivy and for the cough helped her.  She describes difficulty taking opioids because of "allergy".  Cough is the most debilitating symptom.  She started gabapentin for other reasons in the past but not for cough and has tolerated this well.  She also describes class III dyspnea on exertion relieved by rest.  She is not on oxygen.  She has been treated with antibiotics vancomycin and cefepime currently but procalcitonin, respiratory virus panel, COVID PCR, HIV and urine strep are negative [urine Legionella pending].  She is being seen by oncology  Seen by oncology 02/08/2021.  Issue of checkpoint inhibitor pneumonitis due  to her current chemo drug TAGRISSO was raised and this is being stopped by oncology.  IV steroids have been started.   For the palliative treatment of cough oncology started dextromethorphan, Tessalon Perles with the understanding this had not been helpful in the past.  Opiates was offered but patient expressed reservation because of codeine products.  Echocardiogram with mild to moderate mitral regurgitation. Pertinent  Medical History  DIAGNOSIS: Stage IV (T2 a,N2, M1a) non-small cell lung cancer, adenocarcinoma diagnosed in July 2019 and presented with right upper lobe lung mass in addition to mediastinal lymphadenopathy as well as bilateral pulmonary nodules and malignant right pleural effusion.   Biomarker Findings Microsatellite status - Cannot Be Determined Tumor Mutational Burden - Cannot Be Determined Genomic Findings For a complete list of the genes assayed, please refer to the Appendix. EGFR exon 19 deletion ([id number]) TP53 Y220C 7 Disease relevant genes with no reportable alterations: KRAS, ALK, BRAF, MET, RET, ERBB2, ROS1    PRIOR THERAPY:  1) Status post right Pleurx catheter placement by Dr. Prescott Gum for drainage of malignant right pleural effusion. 2) palliative radiotherapy to the enlarging right upper lobe lung mass and mediastinum under the care of Dr. Sondra Come expected to be completed on January 05, 2021.   CURRENT THERAPY: TAGRISSO 80 mg p.o. daily.  First dose was given on 01/29/2018.  Status post 36 months of treatment.     has a past medical history of Anxiety, Arthritis, Asthma, Depression, GERD (gastroesophageal reflux disease), Glaucoma, History of  radiation therapy (01/05/2021), Hypertension, lung ca (dx'd 11/2017), Malignant pleural effusion, PONV (postoperative nausea and vomiting), Pre-diabetes, Raynaud's disease, and Raynaud's disease.   reports that she has never smoked. She has never used smokeless tobacco.  Past Surgical History:  Procedure Laterality  Date   ABDOMINAL HYSTERECTOMY     partial   CHEST TUBE INSERTION Right 01/01/2018   Procedure: INSERTION PLEURAL DRAINAGE CATHETER;  Surgeon: Ivin Poot, MD;  Location: [facility];  Service: Thoracic;  Laterality: Right;   COLONOSCOPY     CORONARY STENT INTERVENTION N/A 06/16/2019   Procedure: CORONARY STENT INTERVENTION;  Surgeon: Jettie Booze, MD;  Location: [facility];  Service: Cardiovascular;  Laterality: N/A;   DILATION AND CURETTAGE OF UTERUS     EYE SURGERY     due to Glaucoma   LEFT HEART CATH AND CORONARY ANGIOGRAPHY N/A 06/16/2019   Procedure: LEFT HEART CATH AND CORONARY ANGIOGRAPHY;  Surgeon: Jettie Booze, MD;  Location: [facility];  Service: Cardiovascular;  Laterality: N/A;   REMOVAL OF PLEURAL DRAINAGE CATHETER Right 11/07/2018   Procedure: REMOVAL OF PLEURAL DRAINAGE CATHETER;  Surgeon: Ivin Poot, MD;  Location: [facility];  Service: Thoracic;  Laterality: Right;   ROTATOR CUFF REPAIR     TUBAL LIGATION     WISDOM TOOTH EXTRACTION      Allergies  Allergen Reactions   Penicillins Other (See Comments)    SYNCOPE PATIENT HAS HAD A PCN REACTION WITH IMMEDIATE RASH, FACIAL/TONGUE/THROAT SWELLING, SOB, OR LIGHTHEADEDNESS WITH HYPOTENSION:  #  #  YES  #  # Has patient had a PCN reaction causing severe rash involving mucus membranes or skin necrosis: No Has patient had a PCN reaction that required hospitalization: No Has patient had a PCN reaction occurring within the last 10 years: No    Vicodin [Hydrocodone-Acetaminophen] Other (See Comments)    Sped up heart and breathing   Other Other (See Comments)    Glaucoma eye drop    Immunization History  Administered Date(s) Administered   Fluad Quad(high Dose 65+) 03/24/2019, 05/04/2020   PFIZER(Purple Top)SARS-COV-2 Vaccination 05/31/2020   Tdap 11/17/2012    Family History  Problem Relation Age of Onset   Heart disease Sister    Heart disease Brother    Lung cancer Other       Current Facility-Administered Medications:    acetaminophen (TYLENOL) tablet 650 mg, 650 mg, Oral, Q6H PRN, 650 mg at 02/09/21 0444 **OR** acetaminophen (TYLENOL) suppository 650 mg, 650 mg, Rectal, Q6H PRN, Doutova, Anastassia, MD   albuterol (PROVENTIL) (2.5 MG/3ML) 0.083% nebulizer solution 2.5 mg, 2.5 mg, Nebulization, Q2H PRN, Doutova, Anastassia, MD   ALPRAZolam Duanne Moron) tablet 0.25 mg, 0.25 mg, Oral, BID PRN, Doutova, Anastassia, MD, 0.25 mg at 02/08/21 2044   amLODipine (NORVASC) tablet 5 mg, 5 mg, Oral, Daily, Samuella Cota, MD, 5 mg at 02/09/21 0354   apixaban (ELIQUIS) tablet 10 mg, 10 mg, Oral, BID, 10 mg at 02/09/21 0832 **FOLLOWED BY** [START ON 02/15/2021] apixaban (ELIQUIS) tablet 5 mg, 5 mg, Oral, BID, Dimple Nanas, RPH   aspirin chewable tablet 81 mg, 81 mg, Oral, Daily, Doutova, Anastassia, MD, 81 mg at 02/09/21 6568   benzonatate (TESSALON) capsule 200 mg, 200 mg, Oral, TID PRN, Samuella Cota, MD, 200 mg at 02/08/21 2308   carvedilol (COREG) tablet 6.25 mg, 6.25 mg, Oral, BID WC, Samuella Cota, MD, 6.25 mg at 02/09/21 1275   ceFEPIme (MAXIPIME) 2 g in sodium chloride 0.9 %  100 mL IVPB, 2 g, Intravenous, Q12H, Pham, Anh P, RPH, Last Rate: 200 mL/hr at 02/09/21 0436, 2 g at 02/09/21 0436   dorzolamide-timolol (COSOPT) 22.3-6.8 MG/ML ophthalmic solution 1 drop, 1 drop, Both Eyes, BID, Doutova, Anastassia, MD, 1 drop at 02/09/21 0836   ezetimibe (ZETIA) tablet 10 mg, 10 mg, Oral, QAC breakfast, Doutova, Anastassia, MD, 10 mg at 02/09/21 0832   guaiFENesin-dextromethorphan (ROBITUSSIN DM) 100-10 MG/5ML syrup 5 mL, 5 mL, Oral, Q4H PRN, Curcio, Kristin R, NP, 5 mL at 02/08/21 2100   latanoprost (XALATAN) 0.005 % ophthalmic solution 1 drop, 1 drop, Both Eyes, QHS, Doutova, Anastassia, MD, 1 drop at 02/08/21 2105   loratadine (CLARITIN) tablet 10 mg, 10 mg, Oral, Daily, Doutova, Anastassia, MD, 10 mg at 02/09/21 0833   pantoprazole (PROTONIX) EC tablet 40 mg, 40  mg, Oral, Daily, Doutova, Anastassia, MD, 40 mg at 02/09/21 0824   predniSONE (DELTASONE) tablet 60 mg, 60 mg, Oral, Q breakfast, Curcio, Kristin R, NP, 60 mg at 02/09/21 1610   traMADol (ULTRAM) tablet 50 mg, 50 mg, Oral, Q6H PRN, Samuella Cota, MD, 50 mg at 02/09/21 0834   vancomycin (VANCOREADY) IVPB 750 mg/150 mL, 750 mg, Intravenous, Q24H, Dimple Nanas, RPH, Last Rate: 150 mL/hr at 02/08/21 1537, 750 mg at 02/08/21 [facility] Events: Including procedures, antibiotic start and stop dates in addition to other pertinent events   x  Interim History / Subjective:  x  Objective   Blood pressure (!) 133/98, pulse 73, temperature 97.7 F (36.5 C), temperature source Oral, resp. rate 20, height $RemoveBe'5\' 4"'rfrQmamCF$  (1.626 m), weight 57.6 kg, SpO2 100 %.        Intake/Output Summary (Last 24 hours) at 02/09/2021 1105 Last data filed at 02/09/2021 0610 Gross per 24 hour  Intake 1144.77 ml  Output --  Net 1144.77 ml   Filed Weights   02/07/21 1602  Weight: 57.6 kg    Examination: General: Thin pleasant lady sitting in the side of the bed.  [facility] hospitalist also talking to her HENT: No neck nodes.  No elevated JVP Lungs: Bilateral lower lobe crackles present Cardiovascular: Normal heart sounds Abdomen: Soft nontender no organomegaly Extremities: No cyanosis no clubbing no edema Neuro: Alert and oriented x3 GU: Not examined  Resolved Hospital Problem list   X  Assessment & Plan:  Stage IV lung cancer at baseline and prior to and present on admission Chronic cough worsening: Baseline, prior to and present on admission Class III shortness of breath baseline prior to present on admission and worsening Status post radiation in July 2022 to the lung Checkpoint inhibitor therapy since 2019 with the risk of drug-induced pneumonitis  Current findings of progression with lymphangitis but also pulmonary embolism acute with acute DVT and possible drug-induced pneumonitis.   Presented with fever and suggestive of bacterial sepsis with so far no evidence  Mild hypophosphatemia  Plan - DC vancomycin [procalcitonin negative and MRSA PCR negative] -Continue cefepime for now but early taper -Treat for drug-induced pneumonitis/radiation pneumonitis as outlined by oncology - For now go with the treatment recommendation of the above and palliative treatment for cough as outlined by oncology but if this does not recommend her to consider gabapentin versus opioids -Walk test for desaturation -Hold off on a bronchoscopy at the moment -Replete Phos  Best Practice (right click and "Reselect all SmartList Selections" daily)  According to the hospitalist    SIGNATURE    Dr. Brand Males, M.D., F.C.C.P,  Pulmonary and  Critical Care Medicine Staff Physician, [place] Director - Interstitial Lung Disease  Program  Pulmonary [place] [facility] [place], [place], [address]  NPI Number:  NPI #[id number]  Pager: [phone], If no answer  -> Check AMION or Try [phone] Telephone (clinical office): [phone] Telephone (research): [phone]  11:05 AM 02/09/2021    LABS    PULMONARY Recent Labs  Lab 02/07/21 2100  HCO3 23.4  O2SAT 76.1    CBC Recent Labs  Lab 02/04/21 0928 02/07/21 1640 02/08/21 0154  HGB 11.8* 12.0 11.0*  HCT 36.2 37.3 34.4*  WBC 3.6* 9.2 9.4  PLT 115* 162 161    COAGULATION Recent Labs  Lab 02/07/21 1640  INR 1.1    CARDIAC  No results for input(s): TROPONINI in the last 168 hours. No results for input(s): PROBNP in the last 168 hours.   CHEMISTRY Recent Labs  Lab 02/04/21 0928 02/07/21 1640 02/07/21 2057 02/08/21 0154  NA 140 136  --  138  K 3.4* 3.6  --  3.8  CL 100 100  --  106  CO2 29 25  --  24  GLUCOSE 87 105*  --  194*  BUN 15 11  --  14  CREATININE 0.98 0.90  --  0.96  CALCIUM 9.8 9.5  --  9.1  MG  --   --  2.0 2.1  PHOS  --   --   3.3 2.1*   Estimated Creatinine Clearance: 47.1 mL/min (by C-G formula based on SCr of 0.96 mg/dL).   LIVER Recent Labs  Lab 02/04/21 0928 02/07/21 1640 02/08/21 0154  AST 30 39 34  ALT $Re'27 31 28  'bxj$ ALKPHOS 57 52 45  BILITOT 0.5 0.8 0.5  PROT 7.1 7.4 6.8  ALBUMIN 3.5 3.7 3.3*  INR  --  1.1  --      INFECTIOUS Recent Labs  Lab 02/07/21 1640 02/07/21 1838 02/07/21 2057  LATICACIDVEN 1.3 1.1  --   PROCALCITON  --   --  <0.10     ENDOCRINE CBG (last 3)  No results for input(s): GLUCAP in the last 72 hours.       IMAGING x48h  - image(s) personally visualized  -   highlighted in bold CT Angio Chest Pulmonary Embolism (PE) W or WO Contrast  Addendum Date: 02/07/2021   ADDENDUM REPORT: 02/07/2021 22:40 ADDENDUM: These results were called by telephone at the time of interpretation on 02/07/2021 at 10:39 pm to provider [facility] , who verbally acknowledged these results. Electronically Signed   By: Fidela Salisbury M.D.   On: 02/07/2021 22:40   Result Date: 02/07/2021 CLINICAL DATA:  PE suspected, high prob. Dyspnea. Non-small cell lung cancer EXAM: CT ANGIOGRAPHY CHEST WITH CONTRAST TECHNIQUE: Multidetector CT imaging of the chest was performed using the standard protocol during bolus administration of intravenous contrast. Multiplanar CT image reconstructions and MIPs were obtained to evaluate the vascular anatomy. CONTRAST:  76mL OMNIPAQUE IOHEXOL 350 MG/ML SOLN COMPARISON:  02/04/2021 FINDINGS: Cardiovascular: There is adequate opacification of the pulmonary arterial tree. There is a central intraluminal branching filling defect within the posterior basal segmental pulmonary artery of the left lower lobe in keeping with acute pulmonary embolism. The embolic burden is small. The central pulmonary arteries are not enlarged. There is no CT evidence of right heart strain. Mild coronary artery calcification. Global cardiac size within normal limits. No pericardial effusion.  Mild atherosclerotic calcification  is seen within the thoracic aorta. No aortic aneurysm. Mediastinum/Nodes: Rind like soft tissue along the paramediastinal pleural surface is indistinguishable from infiltrative soft tissue within the right paratracheal and subcarinal mediastinum though this may be in part related to radiographic technique. No pathologic thoracic adenopathy. The esophagus is unremarkable. Visualized thyroid unremarkable. Lungs/Pleura: Spiculated mass within the right apex is unchanged measuring 19 x 29 mm at axial image # 32/6. There is progressive consolidation within the a basilar right middle lobe and right lower lobe as well as irregular, nodular interlobular septal thickening in keeping with lymphangitic spread of malignancy. Superimposed infection within the right lung base is difficult to exclude. Small right pleural effusion is present. Rind like soft tissue involving the right hemithorax which demonstrates volume loss and compared to into the left may represent pleural metastatic disease but is not well characterized on this examination. Nodular, irregular septal thickening within the left upper lobe is not as well characterized on this examination due to motion artifact, but appears stable since prior examination, again compatible with changes of lymphangitic spread of malignancy. No pneumothorax. No pleural effusion on the left. Central airways are patent. Upper Abdomen: No acute abnormality. Musculoskeletal: No lytic or blastic bone lesion. No acute bone abnormality. Review of the MIP images confirms the above findings. IMPRESSION: Acute pulmonary embolism. Embolic burden is small. No CT evidence of right heart strain. Progressive consolidation within the right lower lobe. While peribronchial consolidation likely represents a component of lymphangitic spread of malignancy, superimposed infection is difficult to exclude and is suspected given the relatively rapid progression of right  lower lobe, in particular, consolidation. Small right pleural effusion. Rind like soft tissue within the right hemithorax with associated right-sided volume loss suspicious for pleural metastatic disease. Mild coronary artery calcification Aortic Atherosclerosis (ICD10-I70.0). Attempts are being made at this time to contact the managing clinician for direct verbal communication of these findings. Electronically Signed: By: Fidela Salisbury M.D. On: 02/07/2021 21:35   DG Chest Port 1 View  Result Date: 02/07/2021 CLINICAL DATA:  Cough.  History of lung cancer EXAM: PORTABLE CHEST 1 VIEW COMPARISON:  02/04/2021 FINDINGS: Stable heart size. Irregular nodule again noted at the right lung apex compatible with known malignancy. Moderate-sized right-sided pleural effusion, increased from prior. Patchy opacities throughout the right lung may reflect a combination of lymphangitic tumor spread and/or pneumonia. Mildly prominent interstitial markings in the left lung without focal airspace consolidation. No left-sided pleural effusion. No pneumothorax. IMPRESSION: 1. Moderate-sized right-sided pleural effusion, increased from prior. 2. Patchy opacities throughout the right lung may reflect a combination of lymphangitic tumor spread and/or pneumonia. Electronically Signed   By: Davina Poke D.O.   On: 02/07/2021 17:20   ECHOCARDIOGRAM COMPLETE  Result Date: 02/08/2021    ECHOCARDIOGRAM REPORT   Patient Name:   ROSALENE WARDROP Gause Date of Exam: 02/08/2021 Medical Rec #:  [id number]       Height:       64.0 in Accession #:    [id number]      Weight:       127.0 lb Date of Birth:  Jul 19, 1950       BSA:          1.613 m Patient Age:    44 years        BP:           135/71 mmHg Patient Gender: F               HR:  77 bpm. Exam Location:  Inpatient Procedure: 2D Echo, Cardiac Doppler and Color Doppler Indications:    Pulmonary Embolus I26.09  History:        Patient has prior history of Echocardiogram examinations, most                  recent 06/13/2019. CAD; Signs/Symptoms:Fever and Shortness of                 Breath. GERd. Raynaud's disease.  Sonographer:    Darlina Sicilian RDCS Referring Phys: [place]  1. Left ventricular ejection fraction, by estimation, is 60 to 65%. The left ventricle has normal function. The left ventricle has no regional wall motion abnormalities. Left ventricular diastolic parameters were normal.  2. Right ventricular systolic function is normal. The right ventricular size is normal. There is normal pulmonary artery systolic pressure. The estimated right ventricular systolic pressure is 93.9 mmHg.  3. The mitral valve is normal in structure. Mild to moderate mitral valve regurgitation. No evidence of mitral stenosis.  4. Tricuspid valve regurgitation is mild to moderate.  5. The aortic valve is normal in structure. Aortic valve regurgitation is not visualized. No aortic stenosis is present.  6. The inferior vena cava is normal in size with greater than 50% respiratory variability, suggesting right atrial pressure of 3 mmHg. FINDINGS  Left Ventricle: Left ventricular ejection fraction, by estimation, is 60 to 65%. The left ventricle has normal function. The left ventricle has no regional wall motion abnormalities. The left ventricular internal cavity size was normal in size. There is  no left ventricular hypertrophy. Left ventricular diastolic parameters were normal. Right Ventricle: The right ventricular size is normal. No increase in right ventricular wall thickness. Right ventricular systolic function is normal. There is normal pulmonary artery systolic pressure. The tricuspid regurgitant velocity is 2.62 m/s, and  with an assumed right atrial pressure of 3 mmHg, the estimated right ventricular systolic pressure is 03.0 mmHg. Left Atrium: Left atrial size was normal in size. Right Atrium: Right atrial size was normal in size. Pericardium: There is no evidence of pericardial  effusion. Mitral Valve: The mitral valve is normal in structure. Mild to moderate mitral valve regurgitation. No evidence of mitral valve stenosis. Tricuspid Valve: The tricuspid valve is normal in structure. Tricuspid valve regurgitation is mild to moderate. No evidence of tricuspid stenosis. Aortic Valve: The aortic valve is normal in structure. Aortic valve regurgitation is not visualized. No aortic stenosis is present. Pulmonic Valve: The pulmonic valve was normal in structure. Pulmonic valve regurgitation is trivial. No evidence of pulmonic stenosis. Aorta: The aortic root is normal in size and structure. Venous: The inferior vena cava is normal in size with greater than 50% respiratory variability, suggesting right atrial pressure of 3 mmHg. IAS/Shunts: No atrial level shunt detected by color flow Doppler.  LEFT VENTRICLE PLAX 2D LVIDd:         3.90 cm  Diastology LVIDs:         2.60 cm  LV e' medial:    9.32 cm/s LV PW:         0.60 cm  LV E/e' medial:  12.0 LV IVS:        0.70 cm  LV e' lateral:   10.30 cm/s LVOT diam:     1.80 cm  LV E/e' lateral: 10.8 LV SV:         52 LV SV Index:   32 LVOT Area:     2.54  cm  RIGHT VENTRICLE RV S prime:     11.40 cm/s TAPSE (M-mode): 2.8 cm LEFT ATRIUM             Index       RIGHT ATRIUM           Index LA diam:        3.10 cm 1.92 cm/m  RA Area:     12.30 cm LA Vol (A2C):   28.7 ml 17.79 ml/m RA Volume:   23.80 ml  14.76 ml/m LA Vol (A4C):   32.9 ml 20.40 ml/m LA Biplane Vol: 31.3 ml 19.40 ml/m  AORTIC VALVE LVOT Vmax:   97.50 cm/s LVOT Vmean:  58.400 cm/s LVOT VTI:    0.203 m  AORTA Ao Root diam: 2.70 cm Ao Asc diam:  3.00 cm MITRAL VALVE                 TRICUSPID VALVE MV Area (PHT): 3.72 cm      TR Peak grad:   27.5 mmHg MV Decel Time: 204 msec      TR Vmax:        262.00 cm/s MR Peak grad:    110.7 mmHg MR Mean grad:    81.0 mmHg   SHUNTS MR Vmax:         526.00 cm/s Systemic VTI:  0.20 m MR Vmean:        434.0 cm/s  Systemic Diam: 1.80 cm MR PISA:          0.77 cm MR PISA Eff ROA: 5 mm MR PISA Radius:  0.35 cm MV E velocity: 111.50 cm/s MV A velocity: 76.15 cm/s MV E/A ratio:  1.46 Cherlynn Kaiser MD Electronically signed by Cherlynn Kaiser MD Signature Date/Time: 02/08/2021/6:03:29 PM    Final    VAS [place] LOWER EXTREMITY VENOUS (DVT)  Result Date: 02/08/2021  Lower Venous DVT Study Patient Name:  CHAUNICE OBIE  Date of Exam:   02/08/2021 Medical Rec #: [id number]        Accession #:    [id number] Date of Birth: 1950-08-06        Patient Gender: F Patient Age:   89 years Exam Location:  [facility] Procedure:      VAS [place] LOWER EXTREMITY VENOUS (DVT) Referring Phys: Nyoka Lint DOUTOVA --------------------------------------------------------------------------------  Indications: Pulmonary embolism. Other Indications: Patient states she has been experiencing LLE calf pain and                    heaviness in calf/foot area for about a month. Risk Factors: Cancer (Lung). Comparison Study: No previous exams Performing Technologist: Jody Hill RVT, RDMS  Examination Guidelines: A complete evaluation includes B-mode imaging, spectral Doppler, color Doppler, and power Doppler as needed of all accessible portions of each vessel. Bilateral testing is considered an integral part of a complete examination. Limited examinations for reoccurring indications may be performed as noted. The reflux portion of the exam is performed with the patient in reverse Trendelenburg.  +---------+---------------+---------+-----------+----------+--------------+ RIGHT    CompressibilityPhasicitySpontaneityPropertiesThrombus Aging +---------+---------------+---------+-----------+----------+--------------+ CFV      Full           Yes      Yes                                 +---------+---------------+---------+-----------+----------+--------------+ SFJ      Full                                                         +---------+---------------+---------+-----------+----------+--------------+  FV Prox  Full           Yes      Yes                                 +---------+---------------+---------+-----------+----------+--------------+ FV Mid   Full           Yes      Yes                                 +---------+---------------+---------+-----------+----------+--------------+ FV DistalFull           Yes      Yes                                 +---------+---------------+---------+-----------+----------+--------------+ PFV      Full                                                        +---------+---------------+---------+-----------+----------+--------------+ POP      Full           Yes      Yes                                 +---------+---------------+---------+-----------+----------+--------------+ PTV      Full                                                        +---------+---------------+---------+-----------+----------+--------------+ PERO     Full                                                        +---------+---------------+---------+-----------+----------+--------------+   +---------+---------------+---------+-----------+----------+--------------+ LEFT     CompressibilityPhasicitySpontaneityPropertiesThrombus Aging +---------+---------------+---------+-----------+----------+--------------+ CFV      Full           Yes      Yes                                 +---------+---------------+---------+-----------+----------+--------------+ SFJ      Full                                                        +---------+---------------+---------+-----------+----------+--------------+ FV Prox  Full           Yes      Yes                                 +---------+---------------+---------+-----------+----------+--------------+ FV Mid   Full  Yes      Yes                                  +---------+---------------+---------+-----------+----------+--------------+ FV DistalFull           Yes      Yes                                 +---------+---------------+---------+-----------+----------+--------------+ PFV      Full                                                        +---------+---------------+---------+-----------+----------+--------------+ POP      Partial        No       No                   Acute          +---------+---------------+---------+-----------+----------+--------------+ PTV      None           No       No                   Acute          +---------+---------------+---------+-----------+----------+--------------+ PERO     None           No       No                   Acute          +---------+---------------+---------+-----------+----------+--------------+     Summary: BILATERAL: - No evidence of superficial venous thrombosis in the lower extremities, bilaterally. - RIGHT: - There is no evidence of deep vein thrombosis in the lower extremity.  - No cystic structure found in the popliteal fossa.  LEFT: - Findings consistent with acute deep vein thrombosis involving the left popliteal vein, left posterior tibial veins, and left peroneal veins. - A cystic structure is found in the popliteal fossa. - Ultrasound characteristics of a ruptured Baker's Cyst are noted.  *See table(s) above for measurements and observations. Electronically signed by Deitra Mayo MD on 02/08/2021 at 2:51:48 PM.    Final

## 2021-02-09 NOTE — Plan of Care (Signed)
  Problem: Education: Goal: Knowledge of General Education information will improve Description: Including pain rating scale, medication(s)/side effects and non-pharmacologic comfort measures Outcome: Progressing   Problem: Health Behavior/Discharge Planning: Goal: Ability to manage health-related needs will improve Outcome: Progressing   Problem: Clinical Measurements: Goal: Diagnostic test results will improve Outcome: Progressing   Problem: Activity: Goal: Risk for activity intolerance will decrease Outcome: Progressing   Problem: Coping: Goal: Level of anxiety will decrease Outcome: Progressing   Problem: Safety: Goal: Ability to remain free from injury will improve Outcome: Progressing

## 2021-02-09 NOTE — Progress Notes (Addendum)
Physical Therapy Treatment Patient Details Name: Allison [name] MRN: [id number] DOB: [birth year] Today's Date: 02/09/2021    History of Present Illness 70 yo female admitted with Pna, acute PE, acute L LE DVT. Hx of stage IV lung cancer, CAD, MI, glaucoma    PT Comments    Pt reports mobilizing as tolerated. Her speaking voice sounds a bit better on today-explained that speech consult will only be ordered if MD feels it is warranted. Discussed OP PT f/u again on today-will issue a brochure with [place] locations (per pt request).  **Pt can walk with nursing for any O2 assessment needs**    Follow Up Recommendations  Outpatient PT (for general strength/balance/endurance training if pt chooses to pursue)     Equipment Recommendations  None recommended by PT    Recommendations for Other Services       Precautions / Restrictions Precautions Precautions: None Restrictions Weight Bearing Restrictions: No    Mobility  Bed Mobility Overal bed mobility: Modified Independent             General bed mobility comments: sitting EOB    Transfers Overall transfer level: Modified independent                  Ambulation/Gait Ambulation/Gait assistance: Supervision Gait Distance (Feet): 200 Feet Assistive device: None Gait Pattern/deviations: Step-through pattern;Decreased step length - left;Decreased step length - right;Decreased stride length     General Gait Details: Dyspnea 2-3/4 with speaking while ambulating. O2 95% on RA, Intermittent mild unsteadiness-no LOB   Stairs             Wheelchair Mobility    Modified Rankin (Stroke Patients Only)       Balance Overall balance assessment: Mild deficits observed, not formally tested                                          Cognition Arousal/Alertness: Awake/alert Behavior During Therapy: WFL for tasks assessed/performed Overall Cognitive Status: Within Functional Limits for tasks  assessed                                        Exercises      General Comments        Pertinent Vitals/Pain Pain Assessment: Faces Faces Pain Scale: Hurts little more Pain Location: R flank (per pt "side cancer is on") Pain Descriptors / Indicators: Discomfort;Grimacing;Sore Pain Intervention(s): Monitored during session    Home Living Family/patient expects to be discharged to:: Private residence Living Arrangements: Alone Available Help at Discharge: Available PRN/intermittently Type of Home: House Home Access: Stairs to enter   Home Layout: One level Home Equipment: None      Prior Function Level of Independence: Independent          PT Goals (current goals can now be found in the care plan section) Acute Rehab PT Goals Patient Stated Goal: to get back to classes at [facility] Progress towards PT goals: Progressing toward goals    Frequency    Min 3X/week      PT Plan Current plan remains appropriate    Co-evaluation              AM-PAC PT "6 Clicks" Mobility   Outcome Measure  Help needed turning from your back to your side  while in a flat bed without using bedrails?: None Help needed moving from lying on your back to sitting on the side of a flat bed without using bedrails?: None Help needed moving to and from a bed to a chair (including a wheelchair)?: None Help needed standing up from a chair using your arms (e.g., wheelchair or bedside chair)?: None Help needed to walk in hospital room?: None Help needed climbing 3-5 steps with a railing? : None 6 Click Score: 24    End of Session   Activity Tolerance: Patient tolerated treatment well Patient left: in bed;with call bell/phone within reach   PT Visit Diagnosis: Difficulty in walking, not elsewhere classified (R26.2);Unsteadiness on feet (R26.81)     Time: 6073-7106 PT Time Calculation (min) (ACUTE ONLY): 19 min  Charges:  $Gait Training: 8-22 mins                          Doreatha Massed, PT Acute Rehabilitation  Office: [phone] Pager: [phone]

## 2021-02-09 NOTE — Progress Notes (Signed)
PROGRESS NOTE    Allison [name]  ZOX:[id number] DOB: 03-30-1951 DOA: 02/07/2021 PCP: Ronnell Freshwater, NP    Brief Narrative:  70 year old woman PMH including stage IV lung cancer treated with XRT presented with fever, cough, shortness of breath.  Admitted for postobstructive pneumonia and pulmonary embolism.  Assessment & Plan:   Principal Problem:   Respiratory failure ([place]) Active Problems:   Adenocarcinoma of right lung, stage 4 (HCC)   Hypertension   Glaucoma   Postobstructive pneumonia   Pulmonary embolism (HCC)   Elevated troponin   Leg DVT (deep venous thromboembolism), acute, bilateral (HCC)   Aortic atherosclerosis (HCC)  * Respiratory failure (HCC) -- No hypoxia but symptomatic, noted to have short of breath even when speaking.   -Differential includes postobstructive pneumonia, lymphangitic spread of known lung cancer, radiation injury, drug injury. -- Pt seen at bedside with Dr. Chase Caller.  Recommendations for continued steroid trial   Pulmonary embolism (HCC) -- Small clot burden on CT with no evidence of RV strain, hemodynamics stable -- Continue with anticoagulation as tolerated   Postobstructive pneumonia -- Continue empiric antibiotics, no fever, procalcitonin negative, no hypoxia but does have significant increase in respiratory effort and tires quickly when talking.   Leg DVT (deep venous thromboembolism), acute, bilateral ([place]) -- Continue with anticoagulation as tolerated.  No evidence of acute blood loss at this time   Elevated troponin -- Suspect demand ischemia, most likely related to pneumonia, lung cancer, CT without evidence of significant clot burden, however will check echocardiogram.  At this point no signs or symptoms to suggest ACS.  Doubt RV strain. -- 2D echocardiogram with EF of 60 to 65% and no regional wall motion abnormalities.   Adenocarcinoma of right lung, stage 4 ([place]) -- Appreciate oncology evaluation.   Aortic  atherosclerosis (HCC) --continue ezetimibe   Replace phosphorus    DVT prophylaxis: Eliquis Code Status: Full Family Communication: Pt in room, family not at bedside  Status is: Inpatient  Remains inpatient appropriate because:Inpatient level of care appropriate due to severity of illness  Dispo: The patient is from: Home              Anticipated d/c is to: Home              Patient currently is not medically stable to d/c.   Difficult to place patient No       Consultants:  Pulmonary Oncology  Procedures:    Antimicrobials: Anti-infectives (From admission, onward)    Start     Dose/Rate Route Frequency Ordered Stop   02/08/21 1515  vancomycin (VANCOREADY) IVPB 750 mg/150 mL  Status:  Discontinued        750 mg 150 mL/hr over 60 Minutes Intravenous Every 24 hours 02/08/21 1420 02/09/21 1145   02/08/21 0500  ceFEPIme (MAXIPIME) 2 g in sodium chloride 0.9 % 100 mL IVPB        2 g 200 mL/hr over 30 Minutes Intravenous Every 12 hours 02/07/21 2020     02/07/21 1630  ceFEPIme (MAXIPIME) 2 g in sodium chloride 0.9 % 100 mL IVPB        2 g 200 mL/hr over 30 Minutes Intravenous  Once 02/07/21 1629 02/07/21 1832   02/07/21 1630  vancomycin (VANCOCIN) IVPB 1000 mg/200 mL premix        1,000 mg 200 mL/hr over 60 Minutes Intravenous  Once 02/07/21 1629 02/07/21 2250       Subjective: Complains of continued cough  Objective: Vitals:  02/08/21 2024 02/09/21 0442 02/09/21 0757 02/09/21 1200  BP: 129/71 (!) 152/78 (!) 133/98 133/74  Pulse: 80 71 73 73  Resp: 18 20 20 18   Temp: 98.2 F (36.8 C) 97.9 F (36.6 C) 97.7 F (36.5 C) (!) 97.4 F (36.3 C)  TempSrc: Oral  Oral Oral  SpO2: 100% 100% 100% 99%  Weight:      Height:        Intake/Output Summary (Last 24 hours) at 02/09/2021 1548 Last data filed at 02/09/2021 0930 Gross per 24 hour  Intake 1189.87 ml  Output --  Net 1189.87 ml   Filed Weights   02/07/21 1602  Weight: 57.6 kg    Examination: General  exam: Awake, laying in bed, in nad Respiratory system: Normal respiratory effort, no wheezing Cardiovascular system: regular rate, s1, s2 Gastrointestinal system: Soft, nondistended, positive BS Central nervous system: CN2-12 grossly intact, strength intact Extremities: Perfused, no clubbing Skin: Normal skin turgor, no notable skin lesions seen Psychiatry: Mood normal // no visual hallucinations   Data Reviewed: I have personally reviewed following labs and imaging studies  CBC: Recent Labs  Lab 02/04/21 0928 02/07/21 1640 02/08/21 0154  WBC 3.6* 9.2 9.4  NEUTROABS 2.3 7.4 8.7*  HGB 11.8* 12.0 11.0*  HCT 36.2 37.3 34.4*  MCV 80.1 81.6 81.5  PLT 115* 162 662   Basic Metabolic Panel: Recent Labs  Lab 02/04/21 0928 02/07/21 1640 02/07/21 2057 02/08/21 0154  NA 140 136  --  138  K 3.4* 3.6  --  3.8  CL 100 100  --  106  CO2 29 25  --  24  GLUCOSE 87 105*  --  194*  BUN 15 11  --  14  CREATININE 0.98 0.90  --  0.96  CALCIUM 9.8 9.5  --  9.1  MG  --   --  2.0 2.1  PHOS  --   --  3.3 2.1*   GFR: Estimated Creatinine Clearance: 47.1 mL/min (by C-G formula based on SCr of 0.96 mg/dL). Liver Function Tests: Recent Labs  Lab 02/04/21 0928 02/07/21 1640 02/08/21 0154  AST 30 39 34  ALT 27 31 28   ALKPHOS 57 52 45  BILITOT 0.5 0.8 0.5  PROT 7.1 7.4 6.8  ALBUMIN 3.5 3.7 3.3*   No results for input(s): LIPASE, AMYLASE in the last 168 hours. No results for input(s): AMMONIA in the last 168 hours. Coagulation Profile: Recent Labs  Lab 02/07/21 1640  INR 1.1   Cardiac Enzymes: Recent Labs  Lab 02/07/21 2057  CKTOTAL 62   BNP (last 3 results) No results for input(s): PROBNP in the last 8760 hours. HbA1C: No results for input(s): HGBA1C in the last 72 hours. CBG: No results for input(s): GLUCAP in the last 168 hours. Lipid Profile: No results for input(s): CHOL, HDL, LDLCALC, TRIG, CHOLHDL, LDLDIRECT in the last 72 hours. Thyroid Function Tests: Recent Labs     02/08/21 0154  TSH 0.382   Anemia Panel: No results for input(s): VITAMINB12, FOLATE, FERRITIN, TIBC, IRON, RETICCTPCT in the last 72 hours. Sepsis Labs: Recent Labs  Lab 02/07/21 1640 02/07/21 1838 02/07/21 2057  PROCALCITON  --   --  <0.10  LATICACIDVEN 1.3 1.1  --     Recent Results (from the past 240 hour(s))  Culture, blood (routine x 2)     Status: None (Preliminary result)   Collection Time: 02/07/21  4:40 PM   Specimen: BLOOD  Result Value Ref Range Status   Specimen Description  Final    BLOOD RIGHT ANTECUBITAL Performed at [facility] [address] [place], [place] [address]    Special Requests   Final    BOTTLES DRAWN AEROBIC AND ANAEROBIC Blood Culture adequate volume Performed at [facility] [address], [place], [place] [address]    Culture   Final    NO GROWTH 2 DAYS Performed at [facility] [address], [place], [place] [address]    Report Status PENDING  Incomplete  Culture, blood (routine x 2)     Status: None (Preliminary result)   Collection Time: 02/07/21  4:40 PM   Specimen: BLOOD  Result Value Ref Range Status   Specimen Description   Final    BLOOD BLOOD RIGHT FOREARM Performed at [facility] [address] [place], [place] [address]    Special Requests   Final    BOTTLES DRAWN AEROBIC AND ANAEROBIC Blood Culture adequate volume Performed at [facility] [address] [place], [place] [address]    Culture   Final    NO GROWTH 2 DAYS Performed at [facility] [address] [place], [place] [address]    Report Status PENDING  Incomplete  Resp Panel by RT-PCR (Flu A&B, Covid) Nasopharyngeal Swab     Status: None   Collection Time: 02/07/21  4:40 PM   Specimen: Nasopharyngeal Swab; Nasopharyngeal(NP) swabs in vial transport medium  Result Value Ref Range Status   SARS Coronavirus 2 by RT PCR NEGATIVE NEGATIVE Final    Comment:  (NOTE) SARS-CoV-2 target nucleic acids are NOT DETECTED.  The SARS-CoV-2 RNA is generally detectable in upper respiratory specimens during the acute phase of infection. The lowest concentration of SARS-CoV-2 viral copies this assay can detect is 138 copies/mL. A negative result does not preclude SARS-Cov-2 infection and should not be used as the sole basis for treatment or other patient management decisions. A negative result may occur with  improper specimen collection/handling, submission of specimen other than nasopharyngeal swab, presence of viral mutation(s) within the areas targeted by this assay, and inadequate number of viral copies(<138 copies/mL). A negative result must be combined with clinical observations, patient history, and epidemiological information. The expected result is Negative.  Fact Sheet for Patients:  EntrepreneurPulse.com.au  Fact Sheet for Healthcare Providers:  IncredibleEmployment.be  This test is no t yet approved or cleared by the [place] FDA and  has been authorized for detection and/or diagnosis of SARS-CoV-2 by FDA under an Emergency Use Authorization (EUA). This EUA will remain  in effect (meaning this test can be used) for the duration of the COVID-19 declaration under Section 564(b)(1) of the Act, 21 U.S.C.section 360bbb-3(b)(1), unless the authorization is terminated  or revoked sooner.       Influenza A by PCR NEGATIVE NEGATIVE Final   Influenza B by PCR NEGATIVE NEGATIVE Final    Comment: (NOTE) The Xpert Xpress SARS-CoV-2/FLU/RSV plus assay is intended as an aid in the diagnosis of influenza from Nasopharyngeal swab specimens and should not be used as a sole basis for treatment. Nasal washings and aspirates are unacceptable for Xpert Xpress SARS-CoV-2/FLU/RSV testing.  Fact Sheet for Patients: EntrepreneurPulse.com.au  Fact Sheet for Healthcare  Providers: IncredibleEmployment.be  This test is not yet approved or cleared by the [place] FDA and has been authorized for detection and/or diagnosis of SARS-CoV-2 by FDA under an Emergency Use Authorization (EUA). This EUA will remain in effect (meaning this test  can be used) for the duration of the COVID-19 declaration under Section 564(b)(1) of the Act, 21 U.S.C. section 360bbb-3(b)(1), unless the authorization is terminated or revoked.  Performed at [facility], [place] [address] [place], [place] [address]   MRSA Next Gen by PCR, Nasal     Status: None   Collection Time: 02/08/21  9:00 PM  Result Value Ref Range Status   MRSA by PCR Next Gen NOT DETECTED NOT DETECTED Final    Comment: (NOTE) The GeneXpert MRSA Assay (FDA approved for NASAL specimens only), is one component of a comprehensive MRSA colonization surveillance program. It is not intended to diagnose MRSA infection nor to guide or monitor treatment for MRSA infections. Test performance is not FDA approved in patients less than 65 years old. Performed at [facility], [place] [address], [place], [place] [address]   Respiratory (~20 pathogens) panel by PCR     Status: None   Collection Time: 02/08/21 11:08 PM   Specimen: Nasopharyngeal Swab; Respiratory  Result Value Ref Range Status   Adenovirus NOT DETECTED NOT DETECTED Final   Coronavirus 229E NOT DETECTED NOT DETECTED Final    Comment: (NOTE) The Coronavirus on the Respiratory Panel, DOES NOT test for the novel  Coronavirus (2019 nCoV)    Coronavirus HKU1 NOT DETECTED NOT DETECTED Final   Coronavirus NL63 NOT DETECTED NOT DETECTED Final   Coronavirus OC43 NOT DETECTED NOT DETECTED Final   Metapneumovirus NOT DETECTED NOT DETECTED Final   Rhinovirus / Enterovirus NOT DETECTED NOT DETECTED Final   Influenza A NOT DETECTED NOT DETECTED Final   Influenza B NOT DETECTED NOT DETECTED Final    Parainfluenza Virus 1 NOT DETECTED NOT DETECTED Final   Parainfluenza Virus 2 NOT DETECTED NOT DETECTED Final   Parainfluenza Virus 3 NOT DETECTED NOT DETECTED Final   Parainfluenza Virus 4 NOT DETECTED NOT DETECTED Final   Respiratory Syncytial Virus NOT DETECTED NOT DETECTED Final   Bordetella pertussis NOT DETECTED NOT DETECTED Final   Bordetella Parapertussis NOT DETECTED NOT DETECTED Final   Chlamydophila pneumoniae NOT DETECTED NOT DETECTED Final   Mycoplasma pneumoniae NOT DETECTED NOT DETECTED Final    Comment: Performed at [facility], [place]. [address], [place], [place] [address]     Radiology Studies: CT Angio Chest Pulmonary Embolism (PE) W or WO Contrast  Addendum Date: 02/07/2021   ADDENDUM REPORT: 02/07/2021 22:40 ADDENDUM: These results were called by telephone at the time of interpretation on 02/07/2021 at 10:39 pm to provider [facility] , who verbally acknowledged these results. Electronically Signed   By: Fidela Salisbury M.D.   On: 02/07/2021 22:40   Result Date: 02/07/2021 CLINICAL DATA:  PE suspected, high prob. Dyspnea. Non-small cell lung cancer EXAM: CT ANGIOGRAPHY CHEST WITH CONTRAST TECHNIQUE: Multidetector CT imaging of the chest was performed using the standard protocol during bolus administration of intravenous contrast. Multiplanar CT image reconstructions and MIPs were obtained to evaluate the vascular anatomy. CONTRAST:  63mL OMNIPAQUE IOHEXOL 350 MG/ML SOLN COMPARISON:  02/04/2021 FINDINGS: Cardiovascular: There is adequate opacification of the pulmonary arterial tree. There is a central intraluminal branching filling defect within the posterior basal segmental pulmonary artery of the left lower lobe in keeping with acute pulmonary embolism. The embolic burden is small. The central pulmonary arteries are not enlarged. There is no CT evidence of right heart strain. Mild coronary artery calcification. Global cardiac size within normal limits. No pericardial  effusion. Mild atherosclerotic calcification is seen within the  thoracic aorta. No aortic aneurysm. Mediastinum/Nodes: Rind like soft tissue along the paramediastinal pleural surface is indistinguishable from infiltrative soft tissue within the right paratracheal and subcarinal mediastinum though this may be in part related to radiographic technique. No pathologic thoracic adenopathy. The esophagus is unremarkable. Visualized thyroid unremarkable. Lungs/Pleura: Spiculated mass within the right apex is unchanged measuring 19 x 29 mm at axial image # 32/6. There is progressive consolidation within the a basilar right middle lobe and right lower lobe as well as irregular, nodular interlobular septal thickening in keeping with lymphangitic spread of malignancy. Superimposed infection within the right lung base is difficult to exclude. Small right pleural effusion is present. Rind like soft tissue involving the right hemithorax which demonstrates volume loss and compared to into the left may represent pleural metastatic disease but is not well characterized on this examination. Nodular, irregular septal thickening within the left upper lobe is not as well characterized on this examination due to motion artifact, but appears stable since prior examination, again compatible with changes of lymphangitic spread of malignancy. No pneumothorax. No pleural effusion on the left. Central airways are patent. Upper Abdomen: No acute abnormality. Musculoskeletal: No lytic or blastic bone lesion. No acute bone abnormality. Review of the MIP images confirms the above findings. IMPRESSION: Acute pulmonary embolism. Embolic burden is small. No CT evidence of right heart strain. Progressive consolidation within the right lower lobe. While peribronchial consolidation likely represents a component of lymphangitic spread of malignancy, superimposed infection is difficult to exclude and is suspected given the relatively rapid progression of  right lower lobe, in particular, consolidation. Small right pleural effusion. Rind like soft tissue within the right hemithorax with associated right-sided volume loss suspicious for pleural metastatic disease. Mild coronary artery calcification Aortic Atherosclerosis (ICD10-I70.0). Attempts are being made at this time to contact the managing clinician for direct verbal communication of these findings. Electronically Signed: By: Fidela Salisbury M.D. On: 02/07/2021 21:35   DG Chest Port 1 View  Result Date: 02/07/2021 CLINICAL DATA:  Cough.  History of lung cancer EXAM: PORTABLE CHEST 1 VIEW COMPARISON:  02/04/2021 FINDINGS: Stable heart size. Irregular nodule again noted at the right lung apex compatible with known malignancy. Moderate-sized right-sided pleural effusion, increased from prior. Patchy opacities throughout the right lung may reflect a combination of lymphangitic tumor spread and/or pneumonia. Mildly prominent interstitial markings in the left lung without focal airspace consolidation. No left-sided pleural effusion. No pneumothorax. IMPRESSION: 1. Moderate-sized right-sided pleural effusion, increased from prior. 2. Patchy opacities throughout the right lung may reflect a combination of lymphangitic tumor spread and/or pneumonia. Electronically Signed   By: Davina Poke D.O.   On: 02/07/2021 17:20   ECHOCARDIOGRAM COMPLETE  Result Date: 02/08/2021    ECHOCARDIOGRAM REPORT   Patient Name:   VELISA REGNIER [place] Date of Exam: 02/08/2021 Medical Rec #:  [id number]       Height:       64.0 in Accession #:    [id number]      Weight:       127.0 lb Date of Birth:  Dec 17, 1950       BSA:          1.613 m Patient Age:    27 years        BP:           135/71 mmHg Patient Gender: F               HR:  77 bpm. Exam Location:  Inpatient Procedure: 2D Echo, Cardiac Doppler and Color Doppler Indications:    Pulmonary Embolus I26.09  History:        Patient has prior history of Echocardiogram examinations,  most                 recent 06/13/2019. CAD; Signs/Symptoms:Fever and Shortness of                 Breath. GERd. Raynaud's disease.  Sonographer:    Darlina Sicilian RDCS Referring Phys: [place]  1. Left ventricular ejection fraction, by estimation, is 60 to 65%. The left ventricle has normal function. The left ventricle has no regional wall motion abnormalities. Left ventricular diastolic parameters were normal.  2. Right ventricular systolic function is normal. The right ventricular size is normal. There is normal pulmonary artery systolic pressure. The estimated right ventricular systolic pressure is 00.1 mmHg.  3. The mitral valve is normal in structure. Mild to moderate mitral valve regurgitation. No evidence of mitral stenosis.  4. Tricuspid valve regurgitation is mild to moderate.  5. The aortic valve is normal in structure. Aortic valve regurgitation is not visualized. No aortic stenosis is present.  6. The inferior vena cava is normal in size with greater than 50% respiratory variability, suggesting right atrial pressure of 3 mmHg. FINDINGS  Left Ventricle: Left ventricular ejection fraction, by estimation, is 60 to 65%. The left ventricle has normal function. The left ventricle has no regional wall motion abnormalities. The left ventricular internal cavity size was normal in size. There is  no left ventricular hypertrophy. Left ventricular diastolic parameters were normal. Right Ventricle: The right ventricular size is normal. No increase in right ventricular wall thickness. Right ventricular systolic function is normal. There is normal pulmonary artery systolic pressure. The tricuspid regurgitant velocity is 2.62 m/s, and  with an assumed right atrial pressure of 3 mmHg, the estimated right ventricular systolic pressure is 74.9 mmHg. Left Atrium: Left atrial size was normal in size. Right Atrium: Right atrial size was normal in size. Pericardium: There is no evidence of  pericardial effusion. Mitral Valve: The mitral valve is normal in structure. Mild to moderate mitral valve regurgitation. No evidence of mitral valve stenosis. Tricuspid Valve: The tricuspid valve is normal in structure. Tricuspid valve regurgitation is mild to moderate. No evidence of tricuspid stenosis. Aortic Valve: The aortic valve is normal in structure. Aortic valve regurgitation is not visualized. No aortic stenosis is present. Pulmonic Valve: The pulmonic valve was normal in structure. Pulmonic valve regurgitation is trivial. No evidence of pulmonic stenosis. Aorta: The aortic root is normal in size and structure. Venous: The inferior vena cava is normal in size with greater than 50% respiratory variability, suggesting right atrial pressure of 3 mmHg. IAS/Shunts: No atrial level shunt detected by color flow Doppler.  LEFT VENTRICLE PLAX 2D LVIDd:         3.90 cm  Diastology LVIDs:         2.60 cm  LV e' medial:    9.32 cm/s LV PW:         0.60 cm  LV E/e' medial:  12.0 LV IVS:        0.70 cm  LV e' lateral:   10.30 cm/s LVOT diam:     1.80 cm  LV E/e' lateral: 10.8 LV SV:         52 LV SV Index:   32 LVOT Area:     2.54  cm  RIGHT VENTRICLE RV S prime:     11.40 cm/s TAPSE (M-mode): 2.8 cm LEFT ATRIUM             Index       RIGHT ATRIUM           Index LA diam:        3.10 cm 1.92 cm/m  RA Area:     12.30 cm LA Vol (A2C):   28.7 ml 17.79 ml/m RA Volume:   23.80 ml  14.76 ml/m LA Vol (A4C):   32.9 ml 20.40 ml/m LA Biplane Vol: 31.3 ml 19.40 ml/m  AORTIC VALVE LVOT Vmax:   97.50 cm/s LVOT Vmean:  58.400 cm/s LVOT VTI:    0.203 m  AORTA Ao Root diam: 2.70 cm Ao Asc diam:  3.00 cm MITRAL VALVE                 TRICUSPID VALVE MV Area (PHT): 3.72 cm      TR Peak grad:   27.5 mmHg MV Decel Time: 204 msec      TR Vmax:        262.00 cm/s MR Peak grad:    110.7 mmHg MR Mean grad:    81.0 mmHg   SHUNTS MR Vmax:         526.00 cm/s Systemic VTI:  0.20 m MR Vmean:        434.0 cm/s  Systemic Diam: 1.80 cm MR  PISA:         0.77 cm MR PISA Eff ROA: 5 mm MR PISA Radius:  0.35 cm MV E velocity: 111.50 cm/s MV A velocity: 76.15 cm/s MV E/A ratio:  1.46 Cherlynn Kaiser MD Electronically signed by Cherlynn Kaiser MD Signature Date/Time: 02/08/2021/6:03:29 PM    Final    VAS [place] LOWER EXTREMITY VENOUS (DVT)  Result Date: 02/08/2021  Lower Venous DVT Study Patient Name:  ASLEY [place]  Date of Exam:   02/08/2021 Medical Rec #: [id number]        Accession #:    [id number] Date of Birth: 1950/09/17        Patient Gender: F Patient Age:   38 years Exam Location:  [facility] Procedure:      VAS [place] LOWER EXTREMITY VENOUS (DVT) Referring Phys: Nyoka Lint DOUTOVA --------------------------------------------------------------------------------  Indications: Pulmonary embolism. Other Indications: Patient states she has been experiencing LLE calf pain and                    heaviness in calf/foot area for about a month. Risk Factors: Cancer (Lung). Comparison Study: No previous exams Performing Technologist: Jody Hill RVT, RDMS  Examination Guidelines: A complete evaluation includes B-mode imaging, spectral Doppler, color Doppler, and power Doppler as needed of all accessible portions of each vessel. Bilateral testing is considered an integral part of a complete examination. Limited examinations for reoccurring indications may be performed as noted. The reflux portion of the exam is performed with the patient in reverse Trendelenburg.  +---------+---------------+---------+-----------+----------+--------------+ RIGHT    CompressibilityPhasicitySpontaneityPropertiesThrombus Aging +---------+---------------+---------+-----------+----------+--------------+ CFV      Full           Yes      Yes                                 +---------+---------------+---------+-----------+----------+--------------+ SFJ      Full                                                         +---------+---------------+---------+-----------+----------+--------------+  FV Prox  Full           Yes      Yes                                 +---------+---------------+---------+-----------+----------+--------------+ FV Mid   Full           Yes      Yes                                 +---------+---------------+---------+-----------+----------+--------------+ FV DistalFull           Yes      Yes                                 +---------+---------------+---------+-----------+----------+--------------+ PFV      Full                                                        +---------+---------------+---------+-----------+----------+--------------+ POP      Full           Yes      Yes                                 +---------+---------------+---------+-----------+----------+--------------+ PTV      Full                                                        +---------+---------------+---------+-----------+----------+--------------+ PERO     Full                                                        +---------+---------------+---------+-----------+----------+--------------+   +---------+---------------+---------+-----------+----------+--------------+ LEFT     CompressibilityPhasicitySpontaneityPropertiesThrombus Aging +---------+---------------+---------+-----------+----------+--------------+ CFV      Full           Yes      Yes                                 +---------+---------------+---------+-----------+----------+--------------+ SFJ      Full                                                        +---------+---------------+---------+-----------+----------+--------------+ FV Prox  Full           Yes      Yes                                 +---------+---------------+---------+-----------+----------+--------------+ FV Mid   Full  Yes      Yes                                  +---------+---------------+---------+-----------+----------+--------------+ FV DistalFull           Yes      Yes                                 +---------+---------------+---------+-----------+----------+--------------+ PFV      Full                                                        +---------+---------------+---------+-----------+----------+--------------+ POP      Partial        No       No                   Acute          +---------+---------------+---------+-----------+----------+--------------+ PTV      None           No       No                   Acute          +---------+---------------+---------+-----------+----------+--------------+ PERO     None           No       No                   Acute          +---------+---------------+---------+-----------+----------+--------------+     Summary: BILATERAL: - No evidence of superficial venous thrombosis in the lower extremities, bilaterally. - RIGHT: - There is no evidence of deep vein thrombosis in the lower extremity.  - No cystic structure found in the popliteal fossa.  LEFT: - Findings consistent with acute deep vein thrombosis involving the left popliteal vein, left posterior tibial veins, and left peroneal veins. - A cystic structure is found in the popliteal fossa. - Ultrasound characteristics of a ruptured Baker's Cyst are noted.  *See table(s) above for measurements and observations. Electronically signed by Deitra Mayo MD on 02/08/2021 at 2:51:48 PM.    Final     Scheduled Meds:  amLODipine  5 mg Oral Daily   apixaban  10 mg Oral BID   Followed by   Derrill Memo ON 02/15/2021] apixaban  5 mg Oral BID   aspirin  81 mg Oral Daily   carvedilol  6.25 mg Oral BID WC   dorzolamide-timolol  1 drop Both Eyes BID   ezetimibe  10 mg Oral QAC breakfast   hydrochlorothiazide  25 mg Oral Daily   latanoprost  1 drop Both Eyes QHS   loratadine  10 mg Oral Daily   pantoprazole  40 mg Oral Daily   predniSONE  60 mg  Oral Q breakfast   Continuous Infusions:  ceFEPime (MAXIPIME) IV 2 g (02/09/21 0436)   potassium PHOSPHATE IVPB (in mmol) 10 mmol (02/09/21 1409)     LOS: 2 days   Marylu Lund, MD [facility] Pager On Amion  If 7PM-7AM, please contact night-coverage 02/09/2021, 3:48 PM

## 2021-02-10 ENCOUNTER — Telehealth: Payer: Self-pay | Admitting: Internal Medicine

## 2021-02-10 DIAGNOSIS — R053 Chronic cough: Secondary | ICD-10-CM | POA: Diagnosis not present

## 2021-02-10 DIAGNOSIS — J189 Pneumonia, unspecified organism: Secondary | ICD-10-CM | POA: Diagnosis not present

## 2021-02-10 DIAGNOSIS — I2699 Other pulmonary embolism without acute cor pulmonale: Secondary | ICD-10-CM | POA: Diagnosis not present

## 2021-02-10 DIAGNOSIS — T50905A Adverse effect of unspecified drugs, medicaments and biological substances, initial encounter: Secondary | ICD-10-CM | POA: Diagnosis not present

## 2021-02-10 LAB — BASIC METABOLIC PANEL
Anion gap: 10 (ref 5–15)
BUN: 14 mg/dL (ref 8–23)
CO2: 25 mmol/L (ref 22–32)
Calcium: 9.1 mg/dL (ref 8.9–10.3)
Chloride: 104 mmol/L (ref 98–111)
Creatinine, Ser: 0.9 mg/dL (ref 0.44–1.00)
GFR, Estimated: >60 mL/min (ref >60)
Glucose, Bld: 84 mg/dL (ref 70–99)
Potassium: 3 mmol/L — ABNORMAL LOW (ref 3.5–5.1)
Sodium: 139 mmol/L (ref 135–145)

## 2021-02-10 LAB — CBC
HCT: 33.5 % — ABNORMAL LOW (ref 36.0–46.0)
Hemoglobin: 10.8 g/dL — ABNORMAL LOW (ref 12.0–15.0)
MCH: 26.2 pg (ref 26.0–34.0)
MCHC: 32.2 g/dL (ref 30.0–36.0)
MCV: 81.3 fL (ref 80.0–100.0)
Platelets: 166 10*3/uL (ref 150–400)
RBC: 4.12 MIL/uL (ref 3.87–5.11)
RDW: 15.2 % (ref 11.5–15.5)
WBC: 13.1 10*3/uL — ABNORMAL HIGH (ref 4.0–10.5)
nRBC: 0 % (ref 0.0–0.2)

## 2021-02-10 LAB — LEGIONELLA PNEUMOPHILA SEROGP 1 UR AG: L. pneumophila Serogp 1 Ur Ag: NEGATIVE

## 2021-02-10 LAB — MAGNESIUM: Magnesium: 2.2 mg/dL (ref 1.7–2.4)

## 2021-02-10 LAB — PHOSPHORUS: Phosphorus: 2.5 mg/dL (ref 2.5–4.6)

## 2021-02-10 MED ORDER — DOCUSATE SODIUM 100 MG PO CAPS
100.0000 mg | ORAL_CAPSULE | Freq: Every day | ORAL | Status: DC
  Filled 2021-02-10: qty 1

## 2021-02-10 MED ORDER — MORPHINE SULFATE 20 MG/5ML PO SOLN: 2.5000 mg | ORAL | 0 refills | Status: DC | PRN

## 2021-02-10 MED ORDER — CEPHALEXIN 500 MG PO CAPS: 500.0000 mg | ORAL_CAPSULE | Freq: Three times a day (TID) | ORAL | 0 refills | Status: DC

## 2021-02-10 MED ORDER — BENZONATATE 200 MG PO CAPS: 200.0000 mg | ORAL_CAPSULE | Freq: Three times a day (TID) | ORAL | 0 refills | Status: DC | PRN

## 2021-02-10 MED ORDER — POLYETHYLENE GLYCOL 3350 17 G PO PACK
17.0000 g | PACK | Freq: Every day | ORAL | Status: DC
  Filled 2021-02-10: qty 1

## 2021-02-10 MED ORDER — DOCUSATE SODIUM 100 MG PO CAPS: 100.0000 mg | ORAL_CAPSULE | Freq: Every day | ORAL | 0 refills | Status: AC

## 2021-02-10 MED ORDER — PREDNISONE 10 MG PO TABS: ORAL_TABLET | ORAL | 0 refills | Status: DC

## 2021-02-10 MED ORDER — APIXABAN (ELIQUIS) VTE STARTER PACK (10MG AND 5MG): ORAL_TABLET | ORAL | 0 refills | Status: DC

## 2021-02-10 MED ORDER — CEPHALEXIN 500 MG PO CAPS
500.0000 mg | ORAL_CAPSULE | Freq: Three times a day (TID) | ORAL | Status: DC
  Filled 2021-02-10: qty 1

## 2021-02-10 NOTE — Progress Notes (Signed)
[name] ([facility])  Hospital Liaison RN note         Notified by TOC manager of patient/family request for [facility] Palliative services at home after discharge.              [facility] Palliative team will follow up with patient after discharge.         Please call with any hospice or palliative related questions.         Thank you for the opportunity to participate in this patient's care.     Chrislyn King, BSN, RN [facility] Hospital Liaison (listed on AMION under Hospice/[name])    [phone] (24h on call)    

## 2021-02-10 NOTE — Telephone Encounter (Signed)
Triage  Pls give Allison [name] a first avail appt to see me in clinic. 15 min slot. Any day any time. Called front desk for time without pick up   Thanks   SIGNATURE    Dr. Brand Males, M.D., F.C.C.P,  Pulmonary and Critical Care Medicine Staff Physician, [place] Director - Interstitial Lung Disease  Program  Pulmonary [place] at [place], [place], [address]  NPI Number:  NPI #[id number]  Pager: [phone], If no answer  -> Check AMION or Try [phone] Telephone (clinical office): [phone] Telephone (research): [phone]  11:41 AM 02/10/2021

## 2021-02-10 NOTE — Consult Note (Signed)
NAME:  Allison [name], MRN:  [id number], DOB:  March 25, 1951, LOS: 3 ADMISSION DATE:  02/07/2021, CONSULTATION DATE:  02/09/21 REFERRING MD:  Dr Rhetta Mura and Sarajane Jews, CHIEF COMPLAINT:  Pneumonitis v Pneumonia v Lymphangitis  PCP Ronnell Freshwater, NP Onc: Dr Julien Nordmann GI: Dr Collene Mares Cards: Dr Gardiner Rhyme  brief   70 year old African-American female stage IV lung cancer as described below.  Followed by Dr. Julien Nordmann.  She had a most recent CT scan of the chest 02/04/2021.  On the CT scan she had decrease in the right upper lobe mass and mediastinal adenopathy compared to May 2022 but increasing right-sided pleural effusion but most importantly she had bilateral mid zone consolidation [could be radiation pneumonitis from July 2022] and also definite evidence of lymphangitis new compared to May 2022 [in July 2021 she only had right upper lobe mass that was most evident on the CT] at this visit she complained about not feeling well and febrile was admitted.  In addition the CT angiogram chest showed evidence of small pulmonary embolism.  She also has acute DVT.  She is on anticoagulation for this in the hhospital  In talking to the patient she tells me she has chronic cough since May 2022.  The cough is more in the daytime.  Less at night.  A previous course of prednisone for poison ivy and for the cough helped her.  She describes difficulty taking opioids because of "allergy".  Cough is the most debilitating symptom.  She started gabapentin for other reasons in the past but not for cough and has tolerated this well.  She also describes class III dyspnea on exertion relieved by rest.  She is not on oxygen.  She has been treated with antibiotics vancomycin and cefepime currently but procalcitonin, respiratory virus panel, COVID PCR, HIV and urine strep and leg neg  She is being seen by oncology  Seen by oncology 02/08/2021.  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  IV steroids have been started.   For the palliative treatment of cough oncology started dextromethorphan, Tessalon Perles with the understanding this had not been helpful in the past.  Opiates was offered but patient expressed reservation because of codeine products.  Echocardiogram with mild to moderate mitral regurgitation. Pertinent  Medical History  DIAGNOSIS: Stage IV (T2 a,N2, M1a) non-small cell lung cancer, adenocarcinoma diagnosed in July 2019 and presented with right upper lobe lung mass in addition to mediastinal lymphadenopathy as well as bilateral pulmonary nodules and malignant right pleural effusion.   Biomarker Findings Microsatellite status - Cannot Be Determined Tumor Mutational Burden - Cannot Be Determined Genomic Findings For a complete list of the genes assayed, please refer to the Appendix. EGFR exon 19 deletion ([id number]>H) TP53 Y220C 7 Disease relevant genes with no reportable alterations: KRAS, ALK, BRAF, MET, RET, ERBB2, ROS1    PRIOR THERAPY:  1) Status post right Pleurx catheter placement by Dr. Prescott Gum for drainage of malignant right pleural effusion. 2) palliative radiotherapy to the enlarging right upper lobe lung mass and mediastinum under the care of Dr. Sondra Come expected to be completed on January 05, 2021.   CURRENT THERAPY: TAGRISSO 80 mg p.o. daily.  First dose was given on 01/29/2018.  Status post 36 months of treatment.     has a past medical history of Anxiety, Arthritis, Asthma, Depression, GERD (gastroesophageal reflux disease), Glaucoma, History of radiation therapy (01/05/2021), Hypertension, lung  ca (dx'd 11/2017), Malignant pleural effusion, PONV (postoperative nausea and vomiting), Pre-diabetes, Raynaud's disease, and Raynaud's disease.   reports that she has never smoked. She has never used smokeless tobacco.  Significant Hospital Events: Including procedures, antibiotic start and stop dates in addition to  other pertinent events   02/07/2021 - admit 8/23 - echo normal. RSVP 30. On consult - steroids stgarted and stopped tagrissa 02/09/21 - ccm consult (onc starting steroids and stopping tagrissa)  Interim History / Subjective:   8/25- cough 15% better with steroids . STil lwith Rt sided infra axillary msk pain. Worried she might not better better.Amb walk est - did not desaturate. No fever since 02/07/21  Objective   Blood pressure (!) 160/78, pulse 71, temperature 98.5 F (36.9 C), temperature source Oral, resp. rate 20, height '5\' 4"'  (1.626 m), weight 57.6 kg, SpO2 98 %.        Intake/Output Summary (Last 24 hours) at 02/10/2021 1128 Last data filed at 02/09/2021 2000 Gross per 24 hour  Intake 725.78 ml  Output --  Net 725.78 ml   Filed Weights   02/07/21 1602  Weight: 57.6 kg    Examination: General Appearance:  Looks think. Mild decnditioned. Plesaant. Stable Head:  Normocephalic, without obvious abnormality, atraumatic Eyes:  PERRL - yes, conjunctiva/corneas - muddy     Ears:  Normal external ear canals, both ears Nose:  G tube - no Throat:  ETT TUBE - no , OG tube - no Neck:  Supple,  No enlargement/tenderness/nodules Lungs: Clear to auscultation bilaterally, Heart:  S1 and S2 normal, no murmur, CVP - no.  Pressors - no Abdomen:  Soft, no masses, no organomegaly Genitalia / Rectal:  Not done Extremities:  Extremities- intacg Skin:  ntact in exposed areas . Sacral area - not examined Neurologic:  Sedation - none -> RASS - +1 . Moves all 4s - yes. CAM-ICU - neg . Orientation - x3+   LABS    PULMONARY Recent Labs  Lab 02/07/21 2100  HCO3 23.4  O2SAT 76.1    CBC Recent Labs  Lab 02/07/21 1640 02/08/21 0154 02/10/21 0759  HGB 12.0 11.0* 10.8*  HCT 37.3 34.4* 33.5*  WBC 9.2 9.4 13.1*  PLT 162 161 166    COAGULATION Recent Labs  Lab 02/07/21 1640  INR 1.1    CARDIAC  No results for input(s): TROPONINI in the last 168 hours. No results for input(s):  PROBNP in the last 168 hours.   CHEMISTRY Recent Labs  Lab 02/04/21 0928 02/07/21 1640 02/07/21 2057 02/08/21 0154 02/10/21 0759  NA 140 136  --  138 139  K 3.4* 3.6  --  3.8 3.0*  CL 100 100  --  106 104  CO2 29 25  --  24 25  GLUCOSE 87 105*  --  194* 84  BUN 15 11  --  14 14  CREATININE 0.98 0.90  --  0.96 0.90  CALCIUM 9.8 9.5  --  9.1 9.1  MG  --   --  2.0 2.1 2.2  PHOS  --   --  3.3 2.1* 2.5   Estimated Creatinine Clearance: 50.2 mL/min (by C-G formula based on SCr of 0.9 mg/dL).   LIVER Recent Labs  Lab 02/04/21 0928 02/07/21 1640 02/08/21 0154  AST 30 39 34  ALT '27 31 28  ' ALKPHOS 57 52 45  BILITOT 0.5 0.8 0.5  PROT 7.1 7.4 6.8  ALBUMIN 3.5 3.7 3.3*  INR  --  1.1  --  INFECTIOUS Recent Labs  Lab 02/07/21 1640 02/07/21 1838 02/07/21 2057  LATICACIDVEN 1.3 1.1  --   PROCALCITON  --   --  <0.10     ENDOCRINE CBG (last 3)  No results for input(s): GLUCAP in the last 72 hours.       IMAGING x48h  - image(s) personally visualized  -   highlighted in bold ECHOCARDIOGRAM COMPLETE  Result Date: 02/08/2021    ECHOCARDIOGRAM REPORT   Patient Name:   ASHAWNA HANBACK Spagnolo Date of Exam: 02/08/2021 Medical Rec #:  [id number]       Height:       64.0 in Accession #:    [id number]      Weight:       127.0 lb Date of Birth:  10-17-1950       BSA:          1.613 m Patient Age:    13 years        BP:           135/71 mmHg Patient Gender: F               HR:           77 bpm. Exam Location:  Inpatient Procedure: 2D Echo, Cardiac Doppler and Color Doppler Indications:    Pulmonary Embolus I26.09  History:        Patient has prior history of Echocardiogram examinations, most                 recent 06/13/2019. CAD; Signs/Symptoms:Fever and Shortness of                 Breath. GERd. Raynaud's disease.  Sonographer:    Darlina Sicilian RDCS Referring Phys: [place]  1. Left ventricular ejection fraction, by estimation, is 60 to 65%. The left  ventricle has normal function. The left ventricle has no regional wall motion abnormalities. Left ventricular diastolic parameters were normal.  2. Right ventricular systolic function is normal. The right ventricular size is normal. There is normal pulmonary artery systolic pressure. The estimated right ventricular systolic pressure is 27.7 mmHg.  3. The mitral valve is normal in structure. Mild to moderate mitral valve regurgitation. No evidence of mitral stenosis.  4. Tricuspid valve regurgitation is mild to moderate.  5. The aortic valve is normal in structure. Aortic valve regurgitation is not visualized. No aortic stenosis is present.  6. The inferior vena cava is normal in size with greater than 50% respiratory variability, suggesting right atrial pressure of 3 mmHg. FINDINGS  Left Ventricle: Left ventricular ejection fraction, by estimation, is 60 to 65%. The left ventricle has normal function. The left ventricle has no regional wall motion abnormalities. The left ventricular internal cavity size was normal in size. There is  no left ventricular hypertrophy. Left ventricular diastolic parameters were normal. Right Ventricle: The right ventricular size is normal. No increase in right ventricular wall thickness. Right ventricular systolic function is normal. There is normal pulmonary artery systolic pressure. The tricuspid regurgitant velocity is 2.62 m/s, and  with an assumed right atrial pressure of 3 mmHg, the estimated right ventricular systolic pressure is 41.2 mmHg. Left Atrium: Left atrial size was normal in size. Right Atrium: Right atrial size was normal in size. Pericardium: There is no evidence of pericardial effusion. Mitral Valve: The mitral valve is normal in structure. Mild to moderate mitral valve regurgitation. No evidence of mitral valve stenosis. Tricuspid Valve: The tricuspid valve is normal  in structure. Tricuspid valve regurgitation is mild to moderate. No evidence of tricuspid stenosis.  Aortic Valve: The aortic valve is normal in structure. Aortic valve regurgitation is not visualized. No aortic stenosis is present. Pulmonic Valve: The pulmonic valve was normal in structure. Pulmonic valve regurgitation is trivial. No evidence of pulmonic stenosis. Aorta: The aortic root is normal in size and structure. Venous: The inferior vena cava is normal in size with greater than 50% respiratory variability, suggesting right atrial pressure of 3 mmHg. IAS/Shunts: No atrial level shunt detected by color flow Doppler.  LEFT VENTRICLE PLAX 2D LVIDd:         3.90 cm  Diastology LVIDs:         2.60 cm  LV e' medial:    9.32 cm/s LV PW:         0.60 cm  LV E/e' medial:  12.0 LV IVS:        0.70 cm  LV e' lateral:   10.30 cm/s LVOT diam:     1.80 cm  LV E/e' lateral: 10.8 LV SV:         52 LV SV Index:   32 LVOT Area:     2.54 cm  RIGHT VENTRICLE RV S prime:     11.40 cm/s TAPSE (M-mode): 2.8 cm LEFT ATRIUM             Index       RIGHT ATRIUM           Index LA diam:        3.10 cm 1.92 cm/m  RA Area:     12.30 cm LA Vol (A2C):   28.7 ml 17.79 ml/m RA Volume:   23.80 ml  14.76 ml/m LA Vol (A4C):   32.9 ml 20.40 ml/m LA Biplane Vol: 31.3 ml 19.40 ml/m  AORTIC VALVE LVOT Vmax:   97.50 cm/s LVOT Vmean:  58.400 cm/s LVOT VTI:    0.203 m  AORTA Ao Root diam: 2.70 cm Ao Asc diam:  3.00 cm MITRAL VALVE                 TRICUSPID VALVE MV Area (PHT): 3.72 cm      TR Peak grad:   27.5 mmHg MV Decel Time: 204 msec      TR Vmax:        262.00 cm/s MR Peak grad:    110.7 mmHg MR Mean grad:    81.0 mmHg   SHUNTS MR Vmax:         526.00 cm/s Systemic VTI:  0.20 m MR Vmean:        434.0 cm/s  Systemic Diam: 1.80 cm MR PISA:         0.77 cm MR PISA Eff ROA: 5 mm MR PISA Radius:  0.35 cm MV E velocity: 111.50 cm/s MV A velocity: 76.15 cm/s MV E/A ratio:  1.46 Cherlynn Kaiser MD Electronically signed by Cherlynn Kaiser MD Signature Date/Time: 02/08/2021/6:03:29 PM    Final     Anti-infectives (From admission, onward)     Start     Dose/Rate Route Frequency Ordered Stop   02/08/21 1515  vancomycin (VANCOREADY) IVPB 750 mg/150 mL  Status:  Discontinued        750 mg 150 mL/hr over 60 Minutes Intravenous Every 24 hours 02/08/21 1420 02/09/21 1145   02/08/21 0500  ceFEPIme (MAXIPIME) 2 g in sodium chloride 0.9 % 100 mL IVPB        2 g  200 mL/hr over 30 Minutes Intravenous Every 12 hours 02/07/21 2020     02/07/21 1630  ceFEPIme (MAXIPIME) 2 g in sodium chloride 0.9 % 100 mL IVPB        2 g 200 mL/hr over 30 Minutes Intravenous  Once 02/07/21 1629 02/07/21 1832   02/07/21 1630  vancomycin (VANCOCIN) IVPB 1000 mg/200 mL premix        1,000 mg 200 mL/hr over 60 Minutes Intravenous  Once 02/07/21 1629 02/07/21 [facility] Problem list   X  Assessment & Plan:  Stage IV lung cancer at baseline and prior to and present on admission Chronic cough worsening: Baseline, prior to and present on admission Class III shortness of breath baseline prior to present on admission and worsening Status post radiation in July 2022 to the lung Checkpoint inhibitor therapy since 2019 with the risk of drug-induced pneumonitis  Current findings of   - progression with lymphangitis  - but also pulmonary embolism acute with acute DVT and  - likely/possible drug-induced pneumonitis. (Onc has held tagrissa and started steroids ) -   Presented with fever and suggestive of bacterial sepsis with so far no evidence - so fever likely from PE    Plan - DC vancomycin [procalcitonin negative and MRSA PCR negative] 8/24 -dc cefepime 02/10/21 - can finish empirici cephalexin for 3 mor days -Treat for drug-induced pneumonitis/radiation pneumonitis as outlined by oncology - steroids - Cough control - above with steroids + OTC cough med + teassolon - move top morphine syrup as opd if above does not wor (prior "rxn" was to vicodin after shoulder surgery years ago and heart "raced")  _ dyspnea control  - opd pulm  rehab needed  Followup Future Appointments  Date Time Provider [place]  02/24/2021 10:15 AM [facility] None  02/24/2021 10:45 AM Curt Bears, MD [facility] None  03/07/2021  9:50 AM Ronnell Freshwater, NP [facility] None  03/16/2021  8:00 AM Donato Heinz, MD [facility]  03/25/2021 10:00 AM Light[place], Lucile Crater, MD [facility]   Will make opd followup to see DR [facility] CCM will sign off From PCCM stand point can go home  Best Practice (right click and "Reselect all SmartList Selections" daily)  According to the hospitalist    SIGNATURE    Dr. Brand Males, M.D., F.C.C.P,  Pulmonary and Critical Care Medicine Staff Physician, [place] Director - Interstitial Lung Disease  Program  Pulmonary [place] at [place], [place], [address]  NPI Number:  NPI #[id number]  Pager: [phone], If no answer  -> Check AMION or Try [place] Telephone (clinical office): [phone] Telephone (research): [phone]  11:28 AM 02/10/2021

## 2021-02-10 NOTE — Discharge Summary (Signed)
Physician Discharge Summary  [name] HBZ:[id number] DOB: [birth year] DOA: 02/07/2021  PCP: Dr. [name]  Admit date: 02/07/2021 Discharge date: 02/10/2021  Admitted From: Home Disposition:  Home  Recommendations for Outpatient Follow-up:  Follow up with PCP in 1-2 weeks Follow up with Pulmonary as scheduled Follow up with Oncology as scheduled  Discharge Condition:Stable CODE STATUS:Full Diet recommendation: Regular   Brief/Interim Summary: 70 year old woman PMH including stage IV lung cancer treated with XRT presented with fever, cough, shortness of breath.  Admitted for postobstructive pneumonia and pulmonary embolism.  Discharge Diagnoses:    * Respiratory failure ([place]) -- Pt not hypoxemic, however complained of continued short of breath even when speaking in addition to debilitating cough - Pt seen at bedside with Dr. Chase Caller.  Recommendations for continued steroid taper. Patient to follow up with Pulmonary as outpatient   Pulmonary embolism ([place]) -- Small clot burden on CT with no evidence of RV strain, hemodynamics stable --Patient prescribed anticoagulant on d/c   Postobstructive pneumonia -- Continue empiric antibiotics, no fever, procalcitonin negative, no hypoxia but does have significant increase in respiratory effort and tires quickly when talking. -Pulmonary recommendations for 3 more days of keflex on d/c   Leg DVT (deep venous thromboembolism), acute, bilateral ([place]) -- Continue with anticoagulation as tolerated.  No evidence of acute blood loss at this time   Elevated troponin -- Suspect demand ischemia, most likely related to pneumonia, lung cancer, CT without evidence of significant clot burden, however will check echocardiogram.  At this point no signs or symptoms to suggest ACS.  Doubt RV strain. -- 2D echocardiogram with EF of 60 to 65% and no regional wall motion abnormalities.   Adenocarcinoma of right lung, stage 4 ([place]) -- Appreciate  oncology evaluation.   Aortic atherosclerosis ([place]) --continue ezetimibe   Discharge Instructions   Allergies as of 02/10/2021       Reactions   Penicillins Other (See Comments)   SYNCOPE PATIENT HAS HAD A PCN REACTION WITH IMMEDIATE RASH, FACIAL/TONGUE/THROAT SWELLING, SOB, OR LIGHTHEADEDNESS WITH HYPOTENSION:  #  #  YES  #  # Has patient had a PCN reaction causing severe rash involving mucus membranes or skin necrosis: No Has patient had a PCN reaction that required hospitalization: No Has patient had a PCN reaction occurring within the last 10 years: No   Vicodin [hydrocodone-acetaminophen] Other (See Comments)   Sped up heart and breathing   Other Other (See Comments)   Glaucoma eye drop        Medication List     STOP taking these medications    fluticasone 50 MCG/ACT nasal spray Commonly known as: FLONASE   methylPREDNIsolone 4 MG tablet Commonly known as: MEDROL DOSPACK   osimertinib mesylate 80 MG tablet Commonly known as: TAGRISSO   Repatha SureClick 101 MG/ML Soaj Generic drug: Evolocumab   sucralfate 1 g tablet Commonly known as: Carafate       TAKE these medications    acetaminophen 500 MG tablet Commonly known as: TYLENOL Take 1,000 mg by mouth every 6 (six) hours as needed (pain).   albuterol 108 (90 Base) MCG/ACT inhaler Commonly known as: VENTOLIN HFA Inhale 2 puffs into the lungs every 6 (six) hours as needed for wheezing or shortness of breath.   ALPRAZolam 0.25 MG tablet Commonly known as: XANAX Take 0.25 mg by mouth 2 (two) times daily as needed for anxiety.   amLODipine 5 MG tablet Commonly known as: NORVASC Take 5 mg by mouth daily.  Apixaban Starter Pack (10mg  and 5mg ) Commonly known as: ELIQUIS STARTER PACK Take as directed on package: start with two-5mg  tablets twice daily for 7 days. On day 8, switch to one-5mg  tablet twice daily.   aspirin 81 MG chewable tablet Chew 1 tablet (81 mg total) by mouth daily.   b complex  vitamins capsule Take 1 capsule by mouth daily.   benzonatate 200 MG capsule Commonly known as: TESSALON Take 1 capsule (200 mg total) by mouth 3 (three) times daily as needed for cough.   carvedilol 6.25 MG tablet Commonly known as: COREG TAKE 1 TABLET BY MOUTH 2 TIMES DAILY WITH A MEAL. PLEASE SCHEDULE APPOINTMENT FOR FUTURE REFILLS. What changed: See the new instructions.   cephALEXin 500 MG capsule Commonly known as: KEFLEX Take 1 capsule (500 mg total) by mouth every 8 (eight) hours for 3 days.   cetirizine 5 MG tablet Commonly known as: ZYRTEC Take 5 mg by mouth daily.   docusate sodium 100 MG capsule Commonly known as: COLACE Take 1 capsule (100 mg total) by mouth daily.   dorzolamide-timolol 22.3-6.8 MG/ML ophthalmic solution Commonly known as: COSOPT Place 1 drop into both eyes 2 (two) times daily.   esomeprazole 20 MG packet Commonly known as: NEXIUM Take 20 mg by mouth daily.   ezetimibe 10 MG tablet Commonly known as: ZETIA Take 10 mg by mouth daily.   hydrochlorothiazide 25 MG tablet Commonly known as: HYDRODIURIL Take 25 mg by mouth daily.   latanoprost 0.005 % ophthalmic solution Commonly known as: XALATAN Place 1 drop into both eyes at bedtime.   morphine 20 MG/5ML solution Take 0.6 mLs (2.4 mg total) by mouth every 4 (four) hours as needed (shortness of breath).   predniSONE 10 MG tablet Commonly known as: DELTASONE Take 6 tablets (60 mg total) by mouth daily for 3 days, THEN 4 tablets (40 mg total) daily for 3 days, THEN 2 tablets (20 mg total) daily for 3 days, THEN 1 tablet (10 mg total) daily for 3 days, THEN 0.5 tablets (5 mg total) daily for 2 days. Start taking on: February 10, 2021   prochlorperazine 10 MG tablet Commonly known as: COMPAZINE Take 1 tablet (10 mg total) by mouth every 6 (six) hours as needed for nausea or vomiting.   VITAMIN D-VITAMIN K PO Take 1 tablet by mouth daily.        Follow-up Information     Donato Heinz, MD .   Specialties: Cardiology, Radiology Contact information: [address] [place] [address] [phone]         [name], MD Follow up.   Specialty: Internal Medicine Why: Hospital follow up Contact information: [place] [id number] [phone]                Allergies  Allergen Reactions   Penicillins Other (See Comments)    SYNCOPE PATIENT HAS HAD A PCN REACTION WITH IMMEDIATE RASH, FACIAL/TONGUE/THROAT SWELLING, SOB, OR LIGHTHEADEDNESS WITH HYPOTENSION:  #  #  YES  #  # Has patient had a PCN reaction causing severe rash involving mucus membranes or skin necrosis: No Has patient had a PCN reaction that required hospitalization: No Has patient had a PCN reaction occurring within the last 10 years: No    Vicodin [Hydrocodone-Acetaminophen] Other (See Comments)    Sped up heart and breathing   Other Other (See Comments)    Glaucoma eye drop    Consultations: Pulmonary Oncology  Procedures/Studies:  CT Chest W Contrast  Result Date: 02/06/2021 CLINICAL DATA:  Non-small cell lung cancer staging, ongoing [place] EXAM: CT CHEST, ABDOMEN, AND PELVIS WITH CONTRAST TECHNIQUE: Multidetector CT imaging of the chest, abdomen and pelvis was performed following the standard protocol during bolus administration of intravenous contrast. CONTRAST:  15mL OMNIPAQUE IOHEXOL 350 MG/ML SOLN, additional oral enteric contrast COMPARISON:  PET-CT, 11/09/2020 FINDINGS: CT CHEST FINDINGS Cardiovascular: Aortic atherosclerosis. Normal heart size. Right coronary artery calcifications and stents. No pericardial effusion. Mediastinum/Nodes: Slight interval decrease in size of prominent, although no longer pathologically enlarged mediastinal lymph nodes, index pretracheal node measuring 0.9 x 0.7 cm, previously 1.3 x 1.3 cm (series 2, image 20). Thyroid gland, trachea, and esophagus demonstrate no significant findings.  Lungs/Pleura: Interval decrease in size of a mass of the right pulmonary apex, measuring 2.8 x 1.8 cm, previously 4.4 x 3.5 cm. 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 There are areas of significant consolidation in right middle and right lower lobes (series 6, image 80). No pleural effusion or pneumothorax. Musculoskeletal: No chest wall mass or suspicious bone lesions identified. CT ABDOMEN PELVIS FINDINGS Hepatobiliary: No solid liver abnormality is seen. No gallstones, gallbladder wall thickening, or biliary dilatation. Pancreas: Unremarkable. No pancreatic ductal dilatation or surrounding inflammatory changes. Spleen: Normal in size without significant abnormality. Adrenals/Urinary Tract: Adrenal glands are unremarkable. Kidneys are normal, without renal calculi, solid lesion, or hydronephrosis. Bladder is unremarkable. Stomach/Bowel: Stomach is within normal limits. Appendix is not clearly visualized and may be surgically absent. No evidence of bowel wall thickening, distention, or inflammatory changes. Vascular/Lymphatic: Aortic atherosclerosis. No enlarged abdominal or pelvic lymph nodes. Reproductive: Status post hysterectomy. Other: No abdominal wall hernia or abnormality. No abdominopelvic ascites. Musculoskeletal: No acute or significant osseous findings. IMPRESSION: 1. Interval decrease in size of a mass of the right pulmonary apex. 2. Slight interval decrease in size of prominent, previously FDG avid mediastinal lymph nodes. 3. 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 4. There are areas of significant consolidation in right middle and right lower lobes, which may reflect confluent metastatic disease, or alternately infectious, inflammatory, or postobstructive airspace disease. 5. Overall constellation of findings is consistent with mixed response to treatment. 6. No evidence of metastatic disease in the abdomen or pelvis. 7. Coronary artery disease. Aortic Atherosclerosis (ICD10-I70.0). Electronically Signed   By: Eddie Candle M.D.   On: 02/06/2021 14:23   CT Angio Chest Pulmonary Embolism (PE) W or WO Contrast  Addendum Date: 02/07/2021   ADDENDUM REPORT: 02/07/2021 22:40 ADDENDUM: These results were called by telephone at the time of interpretation on 02/07/2021 at 10:39 pm to provider [facility] , who verbally acknowledged these results. Electronically Signed   By: Fidela Salisbury M.D.   On: 02/07/2021 22:40   Result Date: 02/07/2021 CLINICAL DATA:  PE suspected, high prob. Dyspnea. Non-small cell lung cancer EXAM: CT ANGIOGRAPHY CHEST WITH CONTRAST TECHNIQUE: Multidetector CT imaging of the chest was performed using the standard protocol during bolus administration of intravenous contrast. Multiplanar CT image reconstructions and MIPs were obtained to evaluate the vascular anatomy. CONTRAST:  15mL OMNIPAQUE IOHEXOL 350 MG/ML SOLN COMPARISON:  02/04/2021  FINDINGS: Cardiovascular: There is adequate opacification of the pulmonary arterial tree. There is a central intraluminal branching filling defect within the posterior basal segmental pulmonary artery of the left lower lobe in keeping with acute pulmonary embolism. The embolic burden is small. The central pulmonary arteries are not enlarged. There is no CT evidence of right heart strain. Mild coronary artery calcification. Global cardiac size within normal limits. No pericardial effusion. Mild atherosclerotic calcification is seen within the thoracic aorta. No aortic aneurysm. Mediastinum/Nodes: Rind like  soft tissue along the paramediastinal pleural surface is indistinguishable from infiltrative soft tissue within the right paratracheal and subcarinal mediastinum though this may be in part related to radiographic technique. No pathologic thoracic adenopathy. The esophagus is unremarkable. Visualized thyroid unremarkable. Lungs/Pleura: Spiculated mass within the right apex is unchanged measuring 19 x 29 mm at axial image # 32/6. There is progressive consolidation within the a basilar right middle lobe and right lower lobe as well as irregular, nodular interlobular septal thickening in keeping with lymphangitic spread of malignancy. Superimposed infection within the right lung base is difficult to exclude. Small right pleural effusion is present. Rind like soft tissue involving the right hemithorax which demonstrates volume loss and compared to into the left may represent pleural metastatic disease but is not well characterized on this examination. Nodular, irregular septal thickening within the left upper lobe is not as well characterized on this examination due to motion artifact, but appears stable since prior examination, again compatible with changes of lymphangitic spread of malignancy. No pneumothorax. No pleural effusion on the left. Central airways are patent. Upper Abdomen: No acute abnormality. Musculoskeletal: No lytic or blastic bone lesion. No acute bone abnormality. Review of the MIP images confirms the above findings. IMPRESSION: Acute pulmonary embolism. Embolic burden is small. No CT evidence of right heart strain. Progressive consolidation within the right lower lobe. While peribronchial consolidation likely represents a component of lymphangitic spread of malignancy, superimposed infection is difficult to exclude and is suspected given the relatively rapid progression of right lower lobe, in particular, consolidation. Small right pleural effusion. Rind like soft tissue within the right hemithorax  with associated right-sided volume loss suspicious for pleural metastatic disease. Mild coronary artery calcification Aortic Atherosclerosis (ICD10-I70.0). Attempts are being made at this time to contact the managing clinician for direct verbal communication of these findings. Electronically Signed: By: Fidela Salisbury M.D. On: 02/07/2021 21:35   CT Abdomen Pelvis W Contrast  Result Date: 02/06/2021 CLINICAL DATA:  Non-small cell lung cancer staging, ongoing [place] EXAM: CT CHEST, ABDOMEN, AND PELVIS WITH CONTRAST TECHNIQUE: Multidetector CT imaging of the chest, abdomen and pelvis was performed following the standard protocol during bolus administration of intravenous contrast. CONTRAST:  13mL OMNIPAQUE IOHEXOL 350 MG/ML SOLN, additional oral enteric contrast COMPARISON:  PET-CT, 11/09/2020 FINDINGS: CT CHEST FINDINGS Cardiovascular: Aortic atherosclerosis. Normal heart size. Right coronary artery calcifications and stents. No pericardial effusion. Mediastinum/Nodes: Slight interval decrease in size of prominent, although no longer pathologically enlarged mediastinal lymph nodes, index pretracheal node measuring 0.9 x 0.7 cm, previously 1.3 x 1.3 cm (series 2, image 20). Thyroid gland, trachea, and esophagus demonstrate no significant findings. Lungs/Pleura: Interval decrease in size of a mass of the right pulmonary apex, measuring 2.8 x 1.8 cm, previously 4.4 x 3.5 cm. 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 There are areas of significant consolidation in right middle and right lower lobes (series 6, image 80). No pleural effusion or pneumothorax. Musculoskeletal: No chest wall mass or suspicious bone lesions identified. CT ABDOMEN PELVIS FINDINGS Hepatobiliary: No solid liver  abnormality is seen. No gallstones, gallbladder wall thickening, or biliary dilatation. Pancreas: Unremarkable. No pancreatic ductal dilatation or surrounding inflammatory changes. Spleen: Normal in size without significant abnormality. Adrenals/Urinary Tract: Adrenal glands are unremarkable. Kidneys are normal, without renal calculi, solid lesion, or hydronephrosis. Bladder is unremarkable. Stomach/Bowel: Stomach is within normal limits. Appendix is not clearly visualized and may be surgically absent. No evidence of bowel wall thickening, distention, or inflammatory changes. Vascular/Lymphatic: Aortic atherosclerosis. No enlarged abdominal or pelvic lymph nodes. Reproductive: Status post hysterectomy. Other: No abdominal wall hernia or abnormality. No abdominopelvic ascites. Musculoskeletal: No acute or significant osseous findings. IMPRESSION: 1. Interval decrease in size of a mass of the right pulmonary apex. 2. Slight interval decrease in size of prominent, previously FDG avid mediastinal lymph nodes. 3. 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 4. There are areas of significant consolidation in right middle and right lower lobes, which may reflect confluent metastatic disease, or alternately infectious, inflammatory, or postobstructive airspace disease. 5. Overall constellation of findings is consistent with mixed response to treatment. 6. No evidence of metastatic disease in the abdomen or pelvis. 7. Coronary artery disease. Aortic Atherosclerosis (ICD10-I70.0). Electronically Signed   By: Eddie Candle M.D.   On: 02/06/2021 14:23   DG Chest Port 1 View  Result Date: 02/07/2021 CLINICAL DATA:  Cough.  History of lung cancer EXAM: PORTABLE CHEST 1 VIEW COMPARISON:  02/04/2021 FINDINGS: Stable heart  size. Irregular nodule again noted at the right lung apex compatible with known malignancy. Moderate-sized right-sided pleural effusion, increased from prior. Patchy opacities throughout the right lung may reflect a combination of lymphangitic tumor spread and/or pneumonia. Mildly prominent interstitial markings in the left lung without focal airspace consolidation. No left-sided pleural effusion. No pneumothorax. IMPRESSION: 1. Moderate-sized right-sided pleural effusion, increased from prior. 2. Patchy opacities throughout the right lung may reflect a combination of lymphangitic tumor spread and/or pneumonia. Electronically Signed   By: Davina Poke D.O.   On: 02/07/2021 17:20   ECHOCARDIOGRAM COMPLETE  Result Date: 02/08/2021    ECHOCARDIOGRAM REPORT   Patient Name:   Allison [name] [place] Date of Exam: 02/08/2021 Medical Rec #:  [id number]       Height:       64.0 in Accession #:    [id number]      Weight:       127.0 lb Date of Birth:  1951-05-09       BSA:          1.613 m Patient Age:    69 years        BP:           135/71 mmHg Patient Gender: F               HR:           77 bpm. Exam Location:  Inpatient Procedure: 2D Echo, Cardiac Doppler and Color Doppler Indications:    Pulmonary Embolus I26.09  History:        Patient has prior history of Echocardiogram examinations, most  recent 06/13/2019. CAD; Signs/Symptoms:Fever and Shortness of                 Breath. GERd. Raynaud's disease.  Sonographer:    Darlina Sicilian RDCS Referring Phys: [place]  1. Left ventricular ejection fraction, by estimation, is 60 to 65%. The left ventricle has normal function. The left ventricle has no regional wall motion abnormalities. Left ventricular diastolic parameters were normal.  2. Right ventricular systolic function is normal. The right ventricular size is normal. There is normal pulmonary artery systolic pressure. The estimated right ventricular systolic pressure is 87.5 mmHg.   3. The mitral valve is normal in structure. Mild to moderate mitral valve regurgitation. No evidence of mitral stenosis.  4. Tricuspid valve regurgitation is mild to moderate.  5. The aortic valve is normal in structure. Aortic valve regurgitation is not visualized. No aortic stenosis is present.  6. The inferior vena cava is normal in size with greater than 50% respiratory variability, suggesting right atrial pressure of 3 mmHg. FINDINGS  Left Ventricle: Left ventricular ejection fraction, by estimation, is 60 to 65%. The left ventricle has normal function. The left ventricle has no regional wall motion abnormalities. The left ventricular internal cavity size was normal in size. There is  no left ventricular hypertrophy. Left ventricular diastolic parameters were normal. Right Ventricle: The right ventricular size is normal. No increase in right ventricular wall thickness. Right ventricular systolic function is normal. There is normal pulmonary artery systolic pressure. The tricuspid regurgitant velocity is 2.62 m/s, and  with an assumed right atrial pressure of 3 mmHg, the estimated right ventricular systolic pressure is 64.3 mmHg. Left Atrium: Left atrial size was normal in size. Right Atrium: Right atrial size was normal in size. Pericardium: There is no evidence of pericardial effusion. Mitral Valve: The mitral valve is normal in structure. Mild to moderate mitral valve regurgitation. No evidence of mitral valve stenosis. Tricuspid Valve: The tricuspid valve is normal in structure. Tricuspid valve regurgitation is mild to moderate. No evidence of tricuspid stenosis. Aortic Valve: The aortic valve is normal in structure. Aortic valve regurgitation is not visualized. No aortic stenosis is present. Pulmonic Valve: The pulmonic valve was normal in structure. Pulmonic valve regurgitation is trivial. No evidence of pulmonic stenosis. Aorta: The aortic root is normal in size and structure. Venous: The inferior vena  cava is normal in size with greater than 50% respiratory variability, suggesting right atrial pressure of 3 mmHg. IAS/Shunts: No atrial level shunt detected by color flow Doppler.  LEFT VENTRICLE PLAX 2D LVIDd:         3.90 cm  Diastology LVIDs:         2.60 cm  LV e' medial:    9.32 cm/s LV PW:         0.60 cm  LV E/e' medial:  12.0 LV IVS:        0.70 cm  LV e' lateral:   10.30 cm/s LVOT diam:     1.80 cm  LV E/e' lateral: 10.8 LV SV:         52 LV SV Index:   32 LVOT Area:     2.54 cm  RIGHT VENTRICLE RV S prime:     11.40 cm/s TAPSE (M-mode): 2.8 cm LEFT ATRIUM             Index       RIGHT ATRIUM           Index LA diam:  3.10 cm 1.92 cm/m  RA Area:     12.30 cm LA Vol (A2C):   28.7 ml 17.79 ml/m RA Volume:   23.80 ml  14.76 ml/m LA Vol (A4C):   32.9 ml 20.40 ml/m LA Biplane Vol: 31.3 ml 19.40 ml/m  AORTIC VALVE LVOT Vmax:   97.50 cm/s LVOT Vmean:  58.400 cm/s LVOT VTI:    0.203 m  AORTA Ao Root diam: 2.70 cm Ao Asc diam:  3.00 cm MITRAL VALVE                 TRICUSPID VALVE MV Area (PHT): 3.72 cm      TR Peak grad:   27.5 mmHg MV Decel Time: 204 msec      TR Vmax:        262.00 cm/s MR Peak grad:    110.7 mmHg MR Mean grad:    81.0 mmHg   SHUNTS MR Vmax:         526.00 cm/s Systemic VTI:  0.20 m MR Vmean:        434.0 cm/s  Systemic Diam: 1.80 cm MR PISA:         0.77 cm MR PISA Eff ROA: 5 mm MR PISA Radius:  0.35 cm MV E velocity: 111.50 cm/s MV A velocity: 76.15 cm/s MV E/A ratio:  1.46 Cherlynn Kaiser MD Electronically signed by Cherlynn Kaiser MD Signature Date/Time: 02/08/2021/6:03:29 PM    Final    VAS [place] LOWER EXTREMITY VENOUS (DVT)  Result Date: 02/08/2021  Lower Venous DVT Study Patient Name:  NINFA GIANNELLI  Date of Exam:   02/08/2021 Medical Rec #: [id number]        Accession #:    [id number] Date of Birth: 1951-05-30        Patient Gender: F Patient Age:   25 years Exam Location[facility]  [facility] Procedure:      VAS [place] LOWER EXTREMITY VENOUS (DVT) Referring Phys: Nyoka Lint  DOUTOVA --------------------------------------------------------------------------------  Indications: Pulmonary embolism. Other Indications: Patient states she has been experiencing LLE calf pain and                    heaviness in calf/foot area for about a month. Risk Factors: Cancer (Lung). Comparison Study: No previous exams Performing Technologist: Jody Hill RVT, RDMS  Examination Guidelines: A complete evaluation includes B-mode imaging, spectral Doppler, color Doppler, and power Doppler as needed of all accessible portions of each vessel. Bilateral testing is considered an integral part of a complete examination. Limited examinations for reoccurring indications may be performed as noted. The reflux portion of the exam is performed with the patient in reverse Trendelenburg.  +---------+---------------+---------+-----------+----------+--------------+ RIGHT    CompressibilityPhasicitySpontaneityPropertiesThrombus Aging +---------+---------------+---------+-----------+----------+--------------+ CFV      Full           Yes      Yes                                 +---------+---------------+---------+-----------+----------+--------------+ SFJ      Full                                                        +---------+---------------+---------+-----------+----------+--------------+ FV Prox  Full  Yes      Yes                                 +---------+---------------+---------+-----------+----------+--------------+ FV Mid   Full           Yes      Yes                                 +---------+---------------+---------+-----------+----------+--------------+ FV DistalFull           Yes      Yes                                 +---------+---------------+---------+-----------+----------+--------------+ PFV      Full                                                        +---------+---------------+---------+-----------+----------+--------------+ POP      Full            Yes      Yes                                 +---------+---------------+---------+-----------+----------+--------------+ PTV      Full                                                        +---------+---------------+---------+-----------+----------+--------------+ PERO     Full                                                        +---------+---------------+---------+-----------+----------+--------------+   +---------+---------------+---------+-----------+----------+--------------+ LEFT     CompressibilityPhasicitySpontaneityPropertiesThrombus Aging +---------+---------------+---------+-----------+----------+--------------+ CFV      Full           Yes      Yes                                 +---------+---------------+---------+-----------+----------+--------------+ SFJ      Full                                                        +---------+---------------+---------+-----------+----------+--------------+ FV Prox  Full           Yes      Yes                                 +---------+---------------+---------+-----------+----------+--------------+ FV Mid   Full           Yes      Yes                                 +---------+---------------+---------+-----------+----------+--------------+  FV DistalFull           Yes      Yes                                 +---------+---------------+---------+-----------+----------+--------------+ PFV      Full                                                        +---------+---------------+---------+-----------+----------+--------------+ POP      Partial        No       No                   Acute          +---------+---------------+---------+-----------+----------+--------------+ PTV      None           No       No                   Acute          +---------+---------------+---------+-----------+----------+--------------+ PERO     None           No       No                    Acute          +---------+---------------+---------+-----------+----------+--------------+     Summary: BILATERAL: - No evidence of superficial venous thrombosis in the lower extremities, bilaterally. - RIGHT: - There is no evidence of deep vein thrombosis in the lower extremity.  - No cystic structure found in the popliteal fossa.  LEFT: - Findings consistent with acute deep vein thrombosis involving the left popliteal vein, left posterior tibial veins, and left peroneal veins. - A cystic structure is found in the popliteal fossa. - Ultrasound characteristics of a ruptured Baker's Cyst are noted.  *See table(s) above for measurements and observations. Electronically signed by Deitra Mayo MD on 02/08/2021 at 2:51:48 PM.    Final     Subjective: Eager to go home  Discharge Exam: Vitals:   02/10/21 0554 02/10/21 0800  BP: (!) 155/71 (!) 160/78  Pulse: 77 71  Resp: 20   Temp: 98.5 F (36.9 C)   SpO2: 98%    Vitals:   02/09/21 2148 02/09/21 2159 02/10/21 0554 02/10/21 0800  BP: (!) 147/81  (!) 155/71 (!) 160/78  Pulse: 73 70 77 71  Resp: 20  20   Temp: (!) 97.4 F (36.3 C)  98.5 F (36.9 C)   TempSrc: Oral  Oral   SpO2: 100% 100% 98%   Weight:      Height:        General: Pt is alert, awake, not in acute distress, dry coughing Cardiovascular: RRR, S1/S2 + Respiratory: CTA bilaterally, no wheezing, no rhonchi Abdominal: Soft, NT, ND, bowel sounds + Extremities: no edema, no cyanosis   The results of significant diagnostics from this hospitalization (including imaging, microbiology, ancillary and laboratory) are listed below for reference.     Microbiology: Recent Results (from the past 240 hour(s))  Culture, blood (routine x 2)     Status: None (Preliminary result)   Collection Time: 02/07/21  4:40 PM   Specimen: BLOOD  Result Value Ref Range Status  Specimen Description   Final    BLOOD RIGHT ANTECUBITAL Performed at [facility]  [address], [place], [place] [address]    Special Requests   Final    BOTTLES DRAWN AEROBIC AND ANAEROBIC Blood Culture adequate volume Performed at [facility] [address] [place], [place] [address]    Culture   Final    NO GROWTH 3 DAYS Performed at [facility], [place] [address] [place], [place] [address]    Report Status PENDING  Incomplete  Culture, blood (routine x 2)     Status: None (Preliminary result)   Collection Time: 02/07/21  4:40 PM   Specimen: BLOOD  Result Value Ref Range Status   Specimen Description   Final    BLOOD BLOOD RIGHT FOREARM Performed at [facility] [address], [place], [place] [address]    Special Requests   Final    BOTTLES DRAWN AEROBIC AND ANAEROBIC Blood Culture adequate volume Performed at [facility] [address], [place], [place] [address]    Culture   Final    NO GROWTH 3 DAYS Performed at [facility], [place] [address] [place], [place] [address]    Report Status PENDING  Incomplete  Resp Panel by RT-PCR (Flu A&B, Covid) Nasopharyngeal Swab     Status: None   Collection Time: 02/07/21  4:40 PM   Specimen: Nasopharyngeal Swab; Nasopharyngeal(NP) swabs in vial transport medium  Result Value Ref Range Status   SARS Coronavirus 2 by RT PCR NEGATIVE NEGATIVE Final    Comment: (NOTE) SARS-CoV-2 target nucleic acids are NOT DETECTED.  The SARS-CoV-2 RNA is generally detectable in upper respiratory specimens during the acute phase of infection. The lowest concentration of SARS-CoV-2 viral copies this assay can detect is 138 copies/mL. A negative result does not preclude SARS-Cov-2 infection and should not be used as the sole basis for treatment or other patient management decisions. A negative result may occur with  improper specimen collection/handling, submission of specimen other than nasopharyngeal swab, presence of viral mutation(s) within the areas targeted  by this assay, and inadequate number of viral copies(<138 copies/mL). A negative result must be combined with clinical observations, patient history, and epidemiological information. The expected result is Negative.  Fact Sheet for Patients:  EntrepreneurPulse.com.au  Fact Sheet for Healthcare Providers:  IncredibleEmployment.be  This test is no t yet approved or cleared by the [place] FDA and  has been authorized for detection and/or diagnosis of SARS-CoV-2 by FDA under an Emergency Use Authorization (EUA). This EUA will remain  in effect (meaning this test can be used) for the duration of the COVID-19 declaration under Section 564(b)(1) of the Act, 21 U.S.C.section 360bbb-3(b)(1), unless the authorization is terminated  or revoked sooner.       Influenza A by PCR NEGATIVE NEGATIVE Final   Influenza B by PCR NEGATIVE NEGATIVE Final    Comment: (NOTE) The Xpert Xpress SARS-CoV-2/FLU/RSV plus assay is intended as an aid in the diagnosis of influenza from Nasopharyngeal swab specimens and should not be used as a sole basis for treatment. Nasal washings and aspirates are unacceptable for Xpert Xpress SARS-CoV-2/FLU/RSV testing.  Fact Sheet for Patients: EntrepreneurPulse.com.au  Fact Sheet for Healthcare Providers: IncredibleEmployment.be  This test is not yet approved or cleared by the [place] FDA and has been authorized for detection and/or diagnosis of SARS-CoV-2 by FDA under an Emergency Use Authorization (EUA). This EUA will remain in  effect (meaning this test can be used) for the duration of the COVID-19 declaration under Section 564(b)(1) of the Act, 21 U.S.C. section 360bbb-3(b)(1), unless the authorization is terminated or revoked.  Performed at [facility], [place] [address], [place], [place] [address]   MRSA Next Gen by PCR, Nasal     Status: None   Collection  Time: 02/08/21  9:00 PM  Result Value Ref Range Status   MRSA by PCR Next Gen NOT DETECTED NOT DETECTED Final    Comment: (NOTE) The GeneXpert MRSA Assay (FDA approved for NASAL specimens only), is one component of a comprehensive MRSA colonization surveillance program. It is not intended to diagnose MRSA infection nor to guide or monitor treatment for MRSA infections. Test performance is not FDA approved in patients less than 19 years old. Performed at [facility], [place] [address], [place], [place] [address]   Respiratory (~20 pathogens) panel by PCR     Status: None   Collection Time: 02/08/21 11:08 PM   Specimen: Nasopharyngeal Swab; Respiratory  Result Value Ref Range Status   Adenovirus NOT DETECTED NOT DETECTED Final   Coronavirus 229E NOT DETECTED NOT DETECTED Final    Comment: (NOTE) The Coronavirus on the Respiratory Panel, DOES NOT test for the novel  Coronavirus (2019 nCoV)    Coronavirus HKU1 NOT DETECTED NOT DETECTED Final   Coronavirus NL63 NOT DETECTED NOT DETECTED Final   Coronavirus OC43 NOT DETECTED NOT DETECTED Final   Metapneumovirus NOT DETECTED NOT DETECTED Final   Rhinovirus / Enterovirus NOT DETECTED NOT DETECTED Final   Influenza A NOT DETECTED NOT DETECTED Final   Influenza B NOT DETECTED NOT DETECTED Final   Parainfluenza Virus 1 NOT DETECTED NOT DETECTED Final   Parainfluenza Virus 2 NOT DETECTED NOT DETECTED Final   Parainfluenza Virus 3 NOT DETECTED NOT DETECTED Final   Parainfluenza Virus 4 NOT DETECTED NOT DETECTED Final   Respiratory Syncytial Virus NOT DETECTED NOT DETECTED Final   Bordetella pertussis NOT DETECTED NOT DETECTED Final   Bordetella Parapertussis NOT DETECTED NOT DETECTED Final   Chlamydophila pneumoniae NOT DETECTED NOT DETECTED Final   Mycoplasma pneumoniae NOT DETECTED NOT DETECTED Final    Comment: Performed at [facility], [place]. [address] [place], [place] [address]     Labs: BNP (last 3  results) Recent Labs    02/07/21 1640  BNP 34.1   Basic Metabolic Panel: Recent Labs  Lab 02/04/21 0928 02/07/21 1640 02/07/21 2057 02/08/21 0154 02/10/21 0759  NA 140 136  --  138 139  K 3.4* 3.6  --  3.8 3.0*  CL 100 100  --  106 104  CO2 29 25  --  24 25  GLUCOSE 87 105*  --  194* 84  BUN 15 11  --  14 14  CREATININE 0.98 0.90  --  0.96 0.90  CALCIUM 9.8 9.5  --  9.1 9.1  MG  --   --  2.0 2.1 2.2  PHOS  --   --  3.3 2.1* 2.5   Liver Function Tests: Recent Labs  Lab 02/04/21 0928 02/07/21 1640 02/08/21 0154  AST 30 39 34  ALT 27 31 28   ALKPHOS 57 52 45  BILITOT 0.5 0.8 0.5  PROT 7.1 7.4 6.8  ALBUMIN 3.5 3.7 3.3*   No results for input(s): LIPASE, AMYLASE in the last 168 hours. No results for input(s): AMMONIA in the last 168 hours. CBC: Recent Labs  Lab 02/04/21 [phone]  02/07/21 1640 02/08/21 0154 02/10/21 0759  WBC 3.6* 9.2 9.4 13.1*  NEUTROABS 2.3 7.4 8.7*  --   HGB 11.8* 12.0 11.0* 10.8*  HCT 36.2 37.3 34.4* 33.5*  MCV 80.1 81.6 81.5 81.3  PLT 115* 162 161 166   Cardiac Enzymes: Recent Labs  Lab 02/07/21 2057  CKTOTAL 62   BNP: Invalid input(s): POCBNP CBG: No results for input(s): GLUCAP in the last 168 hours. D-Dimer No results for input(s): DDIMER in the last 72 hours. Hgb A1c No results for input(s): HGBA1C in the last 72 hours. Lipid Profile No results for input(s): CHOL, HDL, LDLCALC, TRIG, CHOLHDL, LDLDIRECT in the last 72 hours. Thyroid function studies Recent Labs    02/08/21 0154  TSH 0.382   Anemia work up No results for input(s): VITAMINB12, FOLATE, FERRITIN, TIBC, IRON, RETICCTPCT in the last 72 hours. Urinalysis    Component Value Date/Time   COLORURINE STRAW (A) 02/07/2021 2050   APPEARANCEUR CLEAR 02/07/2021 2050   LABSPEC 1.005 02/07/2021 2050   PHURINE 6.0 02/07/2021 2050   GLUCOSEU NEGATIVE 02/07/2021 2050   HGBUR MODERATE (A) 02/07/2021 2050   [place] NEGATIVE 02/07/2021 2050   KETONESUR NEGATIVE  02/07/2021 2050   PROTEINUR NEGATIVE 02/07/2021 2050   NITRITE NEGATIVE 02/07/2021 2050   LEUKOCYTESUR NEGATIVE 02/07/2021 2050   Sepsis Labs Invalid input(s): PROCALCITONIN,  WBC,  LACTICIDVEN Microbiology Recent Results (from the past 240 hour(s))  Culture, blood (routine x 2)     Status: None (Preliminary result)   Collection Time: 02/07/21  4:40 PM   Specimen: BLOOD  Result Value Ref Range Status   Specimen Description   Final    BLOOD RIGHT ANTECUBITAL Performed at [facility], [place] [address] [place], [place] [address]    Special Requests   Final    BOTTLES DRAWN AEROBIC AND ANAEROBIC Blood Culture adequate volume Performed at [facility] [address] [place], [place] [address]    Culture   Final    NO GROWTH 3 DAYS Performed at [facility], [place] [address] [place], [place] [address]    Report Status PENDING  Incomplete  Culture, blood (routine x 2)     Status: None (Preliminary result)   Collection Time: 02/07/21  4:40 PM   Specimen: BLOOD  Result Value Ref Range Status   Specimen Description   Final    BLOOD BLOOD RIGHT FOREARM Performed at [facility] [address], [place], [place] [address]    Special Requests   Final    BOTTLES DRAWN AEROBIC AND ANAEROBIC Blood Culture adequate volume Performed at [facility] [address] [place], [place] [address]    Culture   Final    NO GROWTH 3 DAYS Performed at [facility], [place] [address] [place], [place] [address]    Report Status PENDING  Incomplete  Resp Panel by RT-PCR (Flu A&B, Covid) Nasopharyngeal Swab     Status: None   Collection Time: 02/07/21  4:40 PM   Specimen: Nasopharyngeal Swab; Nasopharyngeal(NP) swabs in vial transport medium  Result Value Ref Range Status   SARS Coronavirus 2 by RT PCR NEGATIVE NEGATIVE Final    Comment: (NOTE) SARS-CoV-2 target nucleic acids are NOT DETECTED.  The SARS-CoV-2  RNA is generally detectable in upper respiratory specimens during the acute phase of infection. The lowest concentration of SARS-CoV-2 viral copies this assay can detect is 138 copies/mL. A negative result does not preclude SARS-Cov-2 infection and  should not be used as the sole basis for treatment or other patient management decisions. A negative result may occur with  improper specimen collection/handling, submission of specimen other than nasopharyngeal swab, presence of viral mutation(s) within the areas targeted by this assay, and inadequate number of viral copies(<138 copies/mL). A negative result must be combined with clinical observations, patient history, and epidemiological information. The expected result is Negative.  Fact Sheet for Patients:  EntrepreneurPulse.com.au  Fact Sheet for Healthcare Providers:  IncredibleEmployment.be  This test is no t yet approved or cleared by the [place] FDA and  has been authorized for detection and/or diagnosis of SARS-CoV-2 by FDA under an Emergency Use Authorization (EUA). This EUA will remain  in effect (meaning this test can be used) for the duration of the COVID-19 declaration under Section 564(b)(1) of the Act, 21 U.S.C.section 360bbb-3(b)(1), unless the authorization is terminated  or revoked sooner.       Influenza A by PCR NEGATIVE NEGATIVE Final   Influenza B by PCR NEGATIVE NEGATIVE Final    Comment: (NOTE) The Xpert Xpress SARS-CoV-2/FLU/RSV plus assay is intended as an aid in the diagnosis of influenza from Nasopharyngeal swab specimens and should not be used as a sole basis for treatment. Nasal washings and aspirates are unacceptable for Xpert Xpress SARS-CoV-2/FLU/RSV testing.  Fact Sheet for Patients: EntrepreneurPulse.com.au  Fact Sheet for Healthcare Providers: IncredibleEmployment.be  This test is not yet approved or cleared by the  [place] FDA and has been authorized for detection and/or diagnosis of SARS-CoV-2 by FDA under an Emergency Use Authorization (EUA). This EUA will remain in effect (meaning this test can be used) for the duration of the COVID-19 declaration under Section 564(b)(1) of the Act, 21 U.S.C. section 360bbb-3(b)(1), unless the authorization is terminated or revoked.  Performed at [facility], [place] [address], [place], [place] [address]   MRSA Next Gen by PCR, Nasal     Status: None   Collection Time: 02/08/21  9:00 PM  Result Value Ref Range Status   MRSA by PCR Next Gen NOT DETECTED NOT DETECTED Final    Comment: (NOTE) The GeneXpert MRSA Assay (FDA approved for NASAL specimens only), is one component of a comprehensive MRSA colonization surveillance program. It is not intended to diagnose MRSA infection nor to guide or monitor treatment for MRSA infections. Test performance is not FDA approved in patients less than 65 years old. Performed at [facility], [place] [address], [place], [place] [address]   Respiratory (~20 pathogens) panel by PCR     Status: None   Collection Time: 02/08/21 11:08 PM   Specimen: Nasopharyngeal Swab; Respiratory  Result Value Ref Range Status   Adenovirus NOT DETECTED NOT DETECTED Final   Coronavirus 229E NOT DETECTED NOT DETECTED Final    Comment: (NOTE) The Coronavirus on the Respiratory Panel, DOES NOT test for the novel  Coronavirus (2019 nCoV)    Coronavirus HKU1 NOT DETECTED NOT DETECTED Final   Coronavirus NL63 NOT DETECTED NOT DETECTED Final   Coronavirus OC43 NOT DETECTED NOT DETECTED Final   Metapneumovirus NOT DETECTED NOT DETECTED Final   Rhinovirus / Enterovirus NOT DETECTED NOT DETECTED Final   Influenza A NOT DETECTED NOT DETECTED Final   Influenza B NOT DETECTED NOT DETECTED Final   Parainfluenza Virus 1 NOT DETECTED NOT DETECTED Final   Parainfluenza Virus 2 NOT DETECTED NOT DETECTED Final    Parainfluenza Virus 3 NOT DETECTED NOT DETECTED Final   Parainfluenza Virus 4  NOT DETECTED NOT DETECTED Final   Respiratory Syncytial Virus NOT DETECTED NOT DETECTED Final   Bordetella pertussis NOT DETECTED NOT DETECTED Final   Bordetella Parapertussis NOT DETECTED NOT DETECTED Final   Chlamydophila pneumoniae NOT DETECTED NOT DETECTED Final   Mycoplasma pneumoniae NOT DETECTED NOT DETECTED Final    Comment: Performed at [facility], [place] [address] [place], [place] [address]   Time spent: 30 min  SIGNED:   Marylu Lund, MD  [facility] 02/10/2021, 6:47 PM  If 7PM-7AM, please contact night-coverage

## 2021-02-10 NOTE — TOC Transition Note (Signed)
Transition of Care [facility]) - CM/SW Discharge Note   Patient Details  Name: Allison [name] MRN: [id number] Date of Birth: 1950-12-28  [facility] CM/SW Contact:  Ross Ludwig, LCSW Phone Number: 02/10/2021, 1:50 PM   Clinical Narrative:     CSW was informed that patient and family would like outpatient palliative services.  Patient and family are requesting [name] for outpatient palliative.  CSW contacted [name] and made referral.  Patent and family do not report any other needs or concerns, CSW signing off.        Patient Goals and CMS Choice        Discharge Placement                       Discharge Plan and Services                                     Social Determinants of Health (SDOH) Interventions     Readmission Risk Interventions No flowsheet data found.

## 2021-02-10 NOTE — Telephone Encounter (Signed)
Scheduled appts per 8/25 sch msg. Pt aware.

## 2021-02-10 NOTE — Telephone Encounter (Signed)
Call made to patient, confirmed DOB. Appt made.   Nothing further needed at this time.  

## 2021-02-11 ENCOUNTER — Other Ambulatory Visit: Payer: Self-pay

## 2021-02-11 ENCOUNTER — Emergency Department (HOSPITAL_COMMUNITY): Payer: Medicare HMO

## 2021-02-11 ENCOUNTER — Telehealth: Payer: Self-pay | Admitting: Nurse Practitioner

## 2021-02-11 ENCOUNTER — Encounter (HOSPITAL_COMMUNITY): Payer: Self-pay

## 2021-02-11 ENCOUNTER — Inpatient Hospital Stay (HOSPITAL_COMMUNITY)
Admission: EM | Admit: 2021-02-11 | Discharge: 2021-02-16 | DRG: 065 | Disposition: A | Discharge status: Home / Self Care | Payer: Medicare HMO | Attending: Internal Medicine | Admitting: Internal Medicine

## 2021-02-11 ENCOUNTER — Other Ambulatory Visit: Payer: Self-pay | Admitting: Radiology

## 2021-02-11 DIAGNOSIS — Z20822 Contact with and (suspected) exposure to covid-19: Secondary | ICD-10-CM | POA: Diagnosis present

## 2021-02-11 DIAGNOSIS — R0602 Shortness of breath: Secondary | ICD-10-CM

## 2021-02-11 DIAGNOSIS — Z85118 Personal history of other malignant neoplasm of bronchus and lung: Secondary | ICD-10-CM | POA: Diagnosis not present

## 2021-02-11 DIAGNOSIS — Z880 Allergy status to penicillin: Secondary | ICD-10-CM

## 2021-02-11 DIAGNOSIS — E559 Vitamin D deficiency, unspecified: Secondary | ICD-10-CM | POA: Diagnosis present

## 2021-02-11 DIAGNOSIS — Z923 Personal history of irradiation: Secondary | ICD-10-CM

## 2021-02-11 DIAGNOSIS — R270 Ataxia, unspecified: Secondary | ICD-10-CM | POA: Diagnosis present

## 2021-02-11 DIAGNOSIS — C3491 Malignant neoplasm of unspecified part of right bronchus or lung: Secondary | ICD-10-CM | POA: Diagnosis present

## 2021-02-11 DIAGNOSIS — Z86711 Personal history of pulmonary embolism: Secondary | ICD-10-CM

## 2021-02-11 DIAGNOSIS — Z888 Allergy status to other drugs, medicaments and biological substances status: Secondary | ICD-10-CM

## 2021-02-11 DIAGNOSIS — I252 Old myocardial infarction: Secondary | ICD-10-CM

## 2021-02-11 DIAGNOSIS — I2510 Atherosclerotic heart disease of native coronary artery without angina pectoris: Secondary | ICD-10-CM | POA: Diagnosis present

## 2021-02-11 DIAGNOSIS — D630 Anemia in neoplastic disease: Secondary | ICD-10-CM | POA: Diagnosis present

## 2021-02-11 DIAGNOSIS — I6782 Cerebral ischemia: Secondary | ICD-10-CM | POA: Diagnosis not present

## 2021-02-11 DIAGNOSIS — Z90710 Acquired absence of both cervix and uterus: Secondary | ICD-10-CM

## 2021-02-11 DIAGNOSIS — H409 Unspecified glaucoma: Secondary | ICD-10-CM | POA: Diagnosis present

## 2021-02-11 DIAGNOSIS — R778 Other specified abnormalities of plasma proteins: Secondary | ICD-10-CM | POA: Diagnosis present

## 2021-02-11 DIAGNOSIS — I82403 Acute embolism and thrombosis of unspecified deep veins of lower extremity, bilateral: Secondary | ICD-10-CM | POA: Diagnosis present

## 2021-02-11 DIAGNOSIS — E876 Hypokalemia: Secondary | ICD-10-CM

## 2021-02-11 DIAGNOSIS — Z9221 Personal history of antineoplastic chemotherapy: Secondary | ICD-10-CM

## 2021-02-11 DIAGNOSIS — I2699 Other pulmonary embolism without acute cor pulmonale: Secondary | ICD-10-CM | POA: Diagnosis present

## 2021-02-11 DIAGNOSIS — M1990 Unspecified osteoarthritis, unspecified site: Secondary | ICD-10-CM | POA: Diagnosis present

## 2021-02-11 DIAGNOSIS — R29703 NIHSS score 3: Secondary | ICD-10-CM | POA: Diagnosis present

## 2021-02-11 DIAGNOSIS — E785 Hyperlipidemia, unspecified: Secondary | ICD-10-CM | POA: Diagnosis present

## 2021-02-11 DIAGNOSIS — Z7901 Long term (current) use of anticoagulants: Secondary | ICD-10-CM

## 2021-02-11 DIAGNOSIS — Z885 Allergy status to narcotic agent status: Secondary | ICD-10-CM

## 2021-02-11 DIAGNOSIS — Z801 Family history of malignant neoplasm of trachea, bronchus and lung: Secondary | ICD-10-CM

## 2021-02-11 DIAGNOSIS — Z955 Presence of coronary angioplasty implant and graft: Secondary | ICD-10-CM

## 2021-02-11 DIAGNOSIS — Z86718 Personal history of other venous thrombosis and embolism: Secondary | ICD-10-CM

## 2021-02-11 DIAGNOSIS — I63443 Cerebral infarction due to embolism of bilateral cerebellar arteries: Principal | ICD-10-CM | POA: Diagnosis present

## 2021-02-11 DIAGNOSIS — K219 Gastro-esophageal reflux disease without esophagitis: Secondary | ICD-10-CM | POA: Diagnosis present

## 2021-02-11 DIAGNOSIS — R200 Anesthesia of skin: Secondary | ICD-10-CM | POA: Diagnosis not present

## 2021-02-11 DIAGNOSIS — I7300 Raynaud's syndrome without gangrene: Secondary | ICD-10-CM | POA: Diagnosis present

## 2021-02-11 DIAGNOSIS — Z7982 Long term (current) use of aspirin: Secondary | ICD-10-CM

## 2021-02-11 DIAGNOSIS — I25118 Atherosclerotic heart disease of native coronary artery with other forms of angina pectoris: Secondary | ICD-10-CM

## 2021-02-11 DIAGNOSIS — R7303 Prediabetes: Secondary | ICD-10-CM | POA: Diagnosis present

## 2021-02-11 DIAGNOSIS — F411 Generalized anxiety disorder: Secondary | ICD-10-CM | POA: Diagnosis present

## 2021-02-11 DIAGNOSIS — F32A Depression, unspecified: Secondary | ICD-10-CM | POA: Diagnosis present

## 2021-02-11 DIAGNOSIS — I6340 Cerebral infarction due to embolism of unspecified cerebral artery: Secondary | ICD-10-CM

## 2021-02-11 DIAGNOSIS — I700 Atherosclerosis of aorta: Secondary | ICD-10-CM | POA: Diagnosis present

## 2021-02-11 DIAGNOSIS — R1310 Dysphagia, unspecified: Secondary | ICD-10-CM

## 2021-02-11 DIAGNOSIS — I10 Essential (primary) hypertension: Secondary | ICD-10-CM | POA: Diagnosis present

## 2021-02-11 DIAGNOSIS — Z8249 Family history of ischemic heart disease and other diseases of the circulatory system: Secondary | ICD-10-CM

## 2021-02-11 DIAGNOSIS — H539 Unspecified visual disturbance: Secondary | ICD-10-CM | POA: Diagnosis present

## 2021-02-11 DIAGNOSIS — I639 Cerebral infarction, unspecified: Secondary | ICD-10-CM | POA: Diagnosis present

## 2021-02-11 DIAGNOSIS — D6869 Other thrombophilia: Secondary | ICD-10-CM | POA: Diagnosis present

## 2021-02-11 DIAGNOSIS — Z79899 Other long term (current) drug therapy: Secondary | ICD-10-CM

## 2021-02-11 LAB — CBC WITH DIFFERENTIAL/PLATELET
Abs Immature Granulocytes: 0.05 10*3/uL (ref 0.00–0.07)
Basophils Absolute: 0 10*3/uL (ref 0.0–0.1)
Basophils Relative: 0 %
Eosinophils Absolute: 0 10*3/uL (ref 0.0–0.5)
Eosinophils Relative: 0 %
HCT: 39.8 % (ref 36.0–46.0)
Hemoglobin: 12.8 g/dL (ref 12.0–15.0)
Immature Granulocytes: 1 %
Lymphocytes Relative: 5 %
Lymphs Abs: 0.4 10*3/uL — ABNORMAL LOW (ref 0.7–4.0)
MCH: 26.2 pg (ref 26.0–34.0)
MCHC: 32.2 g/dL (ref 30.0–36.0)
MCV: 81.4 fL (ref 80.0–100.0)
Monocytes Absolute: 0.2 10*3/uL (ref 0.1–1.0)
Monocytes Relative: 3 %
Neutro Abs: 8.5 10*3/uL — ABNORMAL HIGH (ref 1.7–7.7)
Neutrophils Relative %: 91 %
Platelets: 172 10*3/uL (ref 150–400)
RBC: 4.89 MIL/uL (ref 3.87–5.11)
RDW: 14.6 % (ref 11.5–15.5)
WBC: 9.2 10*3/uL (ref 4.0–10.5)
nRBC: 0 % (ref 0.0–0.2)

## 2021-02-11 LAB — COMPREHENSIVE METABOLIC PANEL
ALT: 43 U/L (ref 0–44)
AST: 39 U/L (ref 15–41)
Albumin: 3.5 g/dL (ref 3.5–5.0)
Alkaline Phosphatase: 58 U/L (ref 38–126)
Anion gap: 12 (ref 5–15)
BUN: 19 mg/dL (ref 8–23)
CO2: 28 mmol/L (ref 22–32)
Calcium: 9.5 mg/dL (ref 8.9–10.3)
Chloride: 99 mmol/L (ref 98–111)
Creatinine, Ser: 1.02 mg/dL — ABNORMAL HIGH (ref 0.44–1.00)
GFR, Estimated: 59 mL/min — ABNORMAL LOW (ref >60)
Glucose, Bld: 239 mg/dL — ABNORMAL HIGH (ref 70–99)
Potassium: 2.8 mmol/L — ABNORMAL LOW (ref 3.5–5.1)
Sodium: 139 mmol/L (ref 135–145)
Total Bilirubin: 0.3 mg/dL (ref 0.3–1.2)
Total Protein: 7 g/dL (ref 6.5–8.1)

## 2021-02-11 MED ORDER — POTASSIUM CHLORIDE CRYS ER 20 MEQ PO TBCR
40.0000 meq | EXTENDED_RELEASE_TABLET | Freq: Once | ORAL | Status: AC
  Administered 2021-02-11: 40 meq via ORAL
  Filled 2021-02-11: qty 2

## 2021-02-11 MED ORDER — SUCRALFATE 1 G PO TABS: 1.0000 g | ORAL_TABLET | Freq: Four times a day (QID) | ORAL | 0 refills | Status: DC

## 2021-02-11 MED ORDER — GADOBUTROL 1 MMOL/ML IV SOLN
5.0000 mL | Freq: Once | INTRAVENOUS | Status: AC | PRN
  Administered 2021-02-11: 5 mL via INTRAVENOUS

## 2021-02-11 MED ORDER — ACETAMINOPHEN 325 MG PO TABS
650.0000 mg | ORAL_TABLET | Freq: Once | ORAL | Status: AC
  Administered 2021-02-11: 650 mg via ORAL
  Filled 2021-02-11: qty 2

## 2021-02-11 NOTE — ED Provider Notes (Signed)
Emergency Medicine Provider Triage Evaluation Note  Allison [name] , a 70 y.o. female  was evaluated in triage.  Discharged yesterday from here yesterday after admission for shortness of breath related to her lung cancer.  States she woke up this morning at 0800 with feelings of "everything pulling her to the left".  Endorses headache only on the left side, visual changes.  Daughter at the bedside states that she has had some hesitancy walking today.  Denies falls, hitting head, slurred speech.  States that is progressively gotten worse today.  Of note she had DVT and PE on last admission requiring heparin drip and was discharged on Eliquis.    Review of Systems  Positive: Headache, blurry vision Negative: Nausea or vomiting, slurred speech, weakness, numbness or tingling  Physical Exam  BP (!) 157/109 (BP Location: Left Arm)   Pulse (!) 106   Temp 98 F (36.7 C) (Oral)   Resp (!) 22   LMP  (LMP Unknown)   SpO2 97%  Gen:   Awake, no distress  Neuro:  No focal neurological deficits. Alert and oriented x3. Cranial nerves intact. No sensory deficit. No motor deficit. No pronator drift. No slurred speech.  Resp:  Normal effort  MSK:   Moves extremities without difficulty    Medical Decision Making  Medically screening exam initiated at 6:00 PM.  Appropriate orders placed.  Allison [name] was informed that the remainder of the evaluation will be completed by another provider, this initial triage assessment does not replace that evaluation, and the importance of remaining in the ED until their evaluation is complete.     Mickie Hillier, PA-C 02/11/21 1818    Horton, Alvin Critchley, DO 02/11/21 2310

## 2021-02-11 NOTE — Telephone Encounter (Signed)
Patient was recently discharged from the hospital and they would like to do at home palliative care for patient. They need PCP's permission. Please advise, thanks.

## 2021-02-11 NOTE — Telephone Encounter (Signed)
Ben-Please contact patient to schedule a hospital follow up with [facility] for next week.   Heather- patient has [place]. Ok to order Pallative care or would you like to wait until you see patient?

## 2021-02-11 NOTE — Telephone Encounter (Signed)
Return call regarding mychart message. Pt state currently she is experiencing heaviness on her left side, dizziness, and off balance.  Based on symptoms nurse recommended to report to ER for further evaluations.

## 2021-02-11 NOTE — ED Triage Notes (Signed)
Pt presents to ED with a sensation of everything in her body being pulled to the left. Pt was just released from the hospital yesterday and placed on Eliquis for a DVT and PE.

## 2021-02-11 NOTE — ED Provider Notes (Signed)
[facility]-EMERGENCY DEPT Provider Note   CSN: [id number] Arrival date & time: 02/11/21  1750     History Chief Complaint  Patient presents with   Extremity Weakness    Allison [name] is a 70 y.o. female.  HPI  70-year-old female past medical history of HTN, lung cancer, recent PE and hospitalization discharged yesterday on Eliquis presents the emergency department with headache and dizziness/feeling like she is being pulled to the left side.  Patient went to bed last night around 8 PM in her normal state of health.  Woke up this morning around 8 AM and symptoms were present on awaking.  The symptoms have been persistent and have been keeping her from walking correctly all day today.  She has mild associated shortness of breath however feels like this is baseline compared to her chronic symptoms from lung cancer.  Denies any fever, neck stiffness, syncope or other acute neurologic complaint.  Past Medical History:  Diagnosis Date   Anxiety    Arthritis    Asthma    exercise induced   Depression    PMH   GERD (gastroesophageal reflux disease)    Glaucoma    History of radiation therapy 01/05/2021   IMRT right lung  11/24/2020-01/05/2021  Dr James Kinard   Hypertension    lung ca dx'd 11/2017   right   Malignant pleural effusion    right   PONV (postoperative nausea and vomiting)    Pre-diabetes    Raynaud's disease    Raynaud's disease     Patient Active Problem List   Diagnosis Date Noted   Leg DVT (deep venous thromboembolism), acute, bilateral (HCC) 02/08/2021   Aortic atherosclerosis (HCC) 02/08/2021   Respiratory failure (HCC) 02/08/2021   Postobstructive pneumonia 02/07/2021   Pulmonary embolism (HCC) 02/07/2021   Elevated troponin 02/07/2021   Encounter to establish care 02/02/2021   Shortness of breath 02/02/2021   Cough 02/02/2021   Statin myopathy 12/03/2020   Acute non-recurrent frontal sinusitis 11/18/2020   NSCLC with EGFR  mutation (HCC) 06/30/2020   Hx of chest tube placement 02/11/2020   History of chest tube placement 11/12/2019   Abnormal liver function 08/20/2019   Gastroesophageal reflux disease without esophagitis 08/20/2019   Generalized anxiety disorder 08/20/2019   Glaucoma 08/20/2019   Hyperlipidemia 08/20/2019   Menopausal syndrome 08/20/2019   Raynaud's phenomenon 08/20/2019   Vitamin D deficiency 08/20/2019   Status post coronary artery stent placement    History of non-ST elevation myocardial infarction (NSTEMI) 06/12/2019   Hypertension 03/24/2019   Encounter for antineoplastic chemotherapy 01/22/2018   Adenocarcinoma of right lung, stage 4 (HCC) 01/04/2018   Goals of care, counseling/discussion 01/04/2018   Thyroid nodule 12/11/2017   Pleural effusion, right 12/11/2017   Insomnia 04/20/2014    Past Surgical History:  Procedure Laterality Date   ABDOMINAL HYSTERECTOMY     partial   CHEST TUBE INSERTION Right 01/01/2018   Procedure: INSERTION PLEURAL DRAINAGE CATHETER;  Surgeon: Van Trigt, Peter, MD;  Location: [facility] OR;  Service: Thoracic;  Laterality: Right;   COLONOSCOPY     CORONARY STENT INTERVENTION N/A 06/16/2019   Procedure: CORONARY STENT INTERVENTION;  Surgeon: Varanasi, Jayadeep S, MD;  Location: [facility] INVASIVE CV LAB;  Service: Cardiovascular;  Laterality: N/A;   DILATION AND CURETTAGE OF UTERUS     EYE SURGERY     due to Glaucoma   LEFT HEART CATH AND CORONARY ANGIOGRAPHY N/A 06/16/2019   Procedure: LEFT HEART CATH AND CORONARY   ANGIOGRAPHY;  Surgeon: Varanasi, Jayadeep S, MD;  Location: [facility] CV [facility];  Service: Cardiovascular;  Laterality: N/A;   REMOVAL OF PLEURAL DRAINAGE CATHETER Right 11/07/2018   Procedure: REMOVAL OF PLEURAL DRAINAGE CATHETER;  Surgeon: Van Trigt, Peter, MD;  Location: [facility] OR;  Service: Thoracic;  Laterality: Right;   ROTATOR CUFF REPAIR     TUBAL LIGATION     WISDOM TOOTH EXTRACTION       OB History   No obstetric history on file.      Family History  Problem Relation Age of Onset   Heart disease Sister    Heart disease Brother    Lung cancer Other     Social History   Tobacco Use   Smoking status: Never   Smokeless tobacco: Never  Vaping Use   Vaping Use: Never used  Substance Use Topics   Alcohol use: Not Currently    Comment: up to 3 drinks per week   Drug use: No    Comment: CBD oil     Home Medications Prior to Admission medications   Medication Sig Start Date End Date Taking? Authorizing Provider  acetaminophen (TYLENOL) 500 MG tablet Take 1,000 mg by mouth every 6 (six) hours as needed (pain).    [provider]  albuterol (VENTOLIN HFA) 108 (90 Base) MCG/ACT inhaler Inhale 2 puffs into the lungs every 6 (six) hours as needed for wheezing or shortness of breath. 02/02/21   Boscia, Heather E, NP  ALPRAZolam (XANAX) 0.25 MG tablet Take 0.25 mg by mouth 2 (two) times daily as needed for anxiety.     [provider]  amLODipine (NORVASC) 5 MG tablet Take 5 mg by mouth daily. 11/23/20   [provider]  APIXABAN (ELIQUIS) VTE STARTER PACK (10MG AND 5MG) Take as directed on package: start with two-5mg tablets twice daily for 7 days. On day 8, switch to one-5mg tablet twice daily. 02/10/21   Chiu, Stephen K, MD  aspirin 81 MG chewable tablet Chew 1 tablet (81 mg total) by mouth daily. 06/18/19   Mathews, Elizabeth G, MD  b complex vitamins capsule Take 1 capsule by mouth daily.    [provider]  benzonatate (TESSALON) 200 MG capsule Take 1 capsule (200 mg total) by mouth 3 (three) times daily as needed for cough. 02/10/21   Chiu, Stephen K, MD  carvedilol (COREG) 6.25 MG tablet TAKE 1 TABLET BY MOUTH 2 TIMES DAILY WITH A MEAL. PLEASE SCHEDULE APPOINTMENT FOR FUTURE REFILLS. Patient taking differently: Take 6.25 mg by mouth 2 (two) times daily with a meal. 12/08/20   Schumann, Christopher L, MD  cephALEXin (KEFLEX) 500 MG capsule Take 1 capsule (500 mg total) by mouth every 8  (eight) hours for 3 days. 02/10/21 02/13/21  Chiu, Stephen K, MD  cetirizine (ZYRTEC) 5 MG tablet Take 5 mg by mouth daily.    [provider]  docusate sodium (COLACE) 100 MG capsule Take 1 capsule (100 mg total) by mouth daily. 02/10/21 03/12/21  Chiu, Stephen K, MD  dorzolamide-timolol (COSOPT) 22.3-6.8 MG/ML ophthalmic solution Place 1 drop into both eyes 2 (two) times daily.    [provider]  esomeprazole (NEXIUM) 20 MG packet Take 20 mg by mouth daily.    [provider]  ezetimibe (ZETIA) 10 MG tablet Take 10 mg by mouth daily.     [provider]  hydrochlorothiazide (HYDRODIURIL) 25 MG tablet Take 25 mg by mouth daily.    [provider]  latanoprost (  XALATAN) 0.005 % ophthalmic solution Place 1 drop into both eyes at bedtime.  12/14/17   [provider]  morphine 20 MG/5ML solution Take 0.6 mLs (2.4 mg total) by mouth every 4 (four) hours as needed (shortness of breath). 02/10/21   Donne Hazel, MD  predniSONE (DELTASONE) 10 MG tablet Take 6 tablets (60 mg total) by mouth daily for 3 days, THEN 4 tablets (40 mg total) daily for 3 days, THEN 2 tablets (20 mg total) daily for 3 days, THEN 1 tablet (10 mg total) daily for 3 days, THEN 0.5 tablets (5 mg total) daily for 2 days. 02/10/21 02/24/21  Donne Hazel, MD  prochlorperazine (COMPAZINE) 10 MG tablet Take 1 tablet (10 mg total) by mouth every 6 (six) hours as needed for nausea or vomiting. 12/27/20   Curt Bears, MD  sucralfate (CARAFATE) 1 g tablet Take 1 tablet (1 g total) by mouth 4 (four) times daily. with meals and at bedtime. Crush and dissolve in 10 mL of warm water prior to swallowing 02/11/21   Kyung Rudd, MD  VITAMIN D-VITAMIN K PO Take 1 tablet by mouth daily.    [provider]    Allergies    Penicillins, Vicodin [hydrocodone-acetaminophen], and Other  Review of Systems   Review of Systems  Constitutional:  Positive for fatigue. Negative for chills and  fever.  HENT:  Negative for congestion.   Eyes:  Negative for visual disturbance.  Respiratory:  Positive for shortness of breath.   Cardiovascular:  Negative for chest pain.  Gastrointestinal:  Negative for abdominal pain, diarrhea and vomiting.  Genitourinary:  Negative for dysuria.  Skin:  Negative for rash.  Neurological:  Positive for dizziness, light-headedness and headaches. Negative for seizures, syncope, facial asymmetry, speech difficulty, weakness and numbness.   Physical Exam Updated Vital Signs BP (!) 148/74   Pulse 66   Temp 98.4 F (36.9 C) (Oral)   Resp 19   LMP  (LMP Unknown)   SpO2 99%   Physical Exam Vitals and nursing note reviewed.  Constitutional:      Appearance: Normal appearance.  HENT:     Head: Normocephalic.     Mouth/Throat:     Mouth: Mucous membranes are moist.  Eyes:     Pupils: Pupils are equal, round, and reactive to light.  Cardiovascular:     Rate and Rhythm: Normal rate.  Pulmonary:     Effort: Pulmonary effort is normal. No respiratory distress.  Abdominal:     Palpations: Abdomen is soft.     Tenderness: There is no abdominal tenderness.  Musculoskeletal:     Cervical back: No rigidity or tenderness.  Skin:    General: Skin is warm.  Neurological:     Mental Status: She is alert and oriented to person, place, and time.     Cranial Nerves: No cranial nerve deficit.  Psychiatric:        Mood and Affect: Mood normal.    ED Results / Procedures / Treatments   Labs (all labs ordered are listed, but only abnormal results are displayed) Labs Reviewed  COMPREHENSIVE METABOLIC PANEL - Abnormal; Notable for the following components:      Result Value   Potassium 2.8 (*)    Glucose, Bld 239 (*)    Creatinine, Ser 1.02 (*)    GFR, Estimated 59 (*)    All other components within normal limits  CBC WITH DIFFERENTIAL/PLATELET - Abnormal; Notable for the following components:   Neutro Abs  8.5 (*)    Lymphs Abs 0.4 (*)    All other  components within normal limits    EKG EKG Interpretation  Date/Time:  Friday February 11 2021 18:24:18 EDT Ventricular Rate:  72 PR Interval:  127 QRS Duration: 82 QT Interval:  380 QTC Calculation: 416 R Axis:   56 Text Interpretation: Sinus rhythm NSR Confirmed by Lavenia Atlas [phone]) on 02/11/2021 6:25:39 PM  Radiology CT Head Wo Contrast  Result Date: 02/11/2021 CLINICAL DATA:  Sensation of everything in body being pulled to the left EXAM: CT HEAD WITHOUT CONTRAST TECHNIQUE: Contiguous axial images were obtained from the base of the skull through the vertex without intravenous contrast. COMPARISON:  MR brain dated 11/09/2020 FINDINGS: Brain: No evidence of acute infarction, hemorrhage, hydrocephalus, extra-axial collection or mass effect. However, there is a subtle possible 8 mm hypodense in the right cerebellum (series 2/image 9), not evident on prior MR, raising concern for small acute/subacute infarct or possibly a metastasis given the history of prior lung cancer. Vascular: No hyperdense vessel or unexpected calcification. Skull: Normal. Negative for fracture or focal lesion. Sinuses/Orbits: The visualized paranasal sinuses are essentially clear. The mastoid air cells are unopacified. Other: None. IMPRESSION: Possible 8 mm lesion in the right cerebellum, equivocal. This was not evident on prior MRI, raising concern for small acute/subacute infarct or possibly a metastasis. Consider MRI for further characterization. Electronically Signed   By: Julian Hy M.D.   On: 02/11/2021 19:11    Procedures .Critical Care  Date/Time: 02/12/2021 12:16 AM Performed by: Lorelle Gibbs, DO Authorized by: Lorelle Gibbs, DO   Critical care provider statement:    Critical care time (minutes):  45   Critical care time was exclusive of:  Separately billable procedures and treating other patients   Critical care was necessary to treat or prevent imminent or life-threatening deterioration  of the following conditions:  CNS failure or compromise   Critical care was time spent personally by me on the following activities:  Discussions with consultants, evaluation of patient's response to treatment, examination of patient, ordering and performing treatments and interventions, ordering and review of laboratory studies, ordering and review of radiographic studies, pulse oximetry, re-evaluation of patient's condition, obtaining history from patient or surrogate and review of old charts   I assumed direction of critical care for this patient from another provider in my specialty: no     Care discussed with: admitting provider     Medications Ordered in ED Medications  potassium chloride SA (KLOR-CON) CR tablet 40 mEq (40 mEq Oral Given 02/11/21 2109)  gadobutrol (GADAVIST) 1 MMOL/ML injection 5 mL (5 mLs Intravenous Contrast Given 02/11/21 2113)    ED Course  I have reviewed the triage vital signs and the nursing notes.  Pertinent labs & imaging results that were available during my care of the patient were reviewed by me and considered in my medical decision making (see chart for details).    MDM Rules/Calculators/A&P                           70 year old female presents the emergency department with dizziness and feeling like she is pulling to the left side.  Vitals are stable, she does not have an acute neurologic finding on exam.  Is a slightly lower than baseline potassium of 2.8.  This has been orally replaced.  Head CT shows concern for possible stroke versus metastatic disease in the cerebellar area.  MRI with and without show scattered acute/subacute strokes in 1 small area of petechial blood products without hemorrhage/edema.  Patient will be admitted for stroke work-up.  Neuro exam is unchanged.  Patients evaluation and results requires admission for further treatment and care. Patient agrees with admission plan, offers no new complaints and is stable/unchanged at time of  admit.  Final Clinical Impression(s) / ED Diagnoses Final diagnoses:  None    Rx / DC Orders ED Discharge Orders     None        Horton, Kristie M, DO 02/12/21 0017  

## 2021-02-11 NOTE — ED Notes (Signed)
Pt in CT and MRI scans.

## 2021-02-12 ENCOUNTER — Inpatient Hospital Stay (HOSPITAL_COMMUNITY): Payer: Medicare HMO

## 2021-02-12 ENCOUNTER — Encounter (HOSPITAL_COMMUNITY): Payer: Self-pay | Admitting: Family Medicine

## 2021-02-12 DIAGNOSIS — I252 Old myocardial infarction: Secondary | ICD-10-CM | POA: Diagnosis not present

## 2021-02-12 DIAGNOSIS — R270 Ataxia, unspecified: Secondary | ICD-10-CM | POA: Diagnosis present

## 2021-02-12 DIAGNOSIS — I82403 Acute embolism and thrombosis of unspecified deep veins of lower extremity, bilateral: Secondary | ICD-10-CM | POA: Diagnosis not present

## 2021-02-12 DIAGNOSIS — I639 Cerebral infarction, unspecified: Secondary | ICD-10-CM | POA: Diagnosis not present

## 2021-02-12 DIAGNOSIS — I6340 Cerebral infarction due to embolism of unspecified cerebral artery: Secondary | ICD-10-CM | POA: Diagnosis not present

## 2021-02-12 DIAGNOSIS — I6522 Occlusion and stenosis of left carotid artery: Secondary | ICD-10-CM | POA: Diagnosis not present

## 2021-02-12 DIAGNOSIS — I2510 Atherosclerotic heart disease of native coronary artery without angina pectoris: Secondary | ICD-10-CM | POA: Diagnosis present

## 2021-02-12 DIAGNOSIS — I7300 Raynaud's syndrome without gangrene: Secondary | ICD-10-CM | POA: Diagnosis present

## 2021-02-12 DIAGNOSIS — H409 Unspecified glaucoma: Secondary | ICD-10-CM | POA: Diagnosis present

## 2021-02-12 DIAGNOSIS — F419 Anxiety disorder, unspecified: Secondary | ICD-10-CM | POA: Diagnosis not present

## 2021-02-12 DIAGNOSIS — R778 Other specified abnormalities of plasma proteins: Secondary | ICD-10-CM | POA: Diagnosis not present

## 2021-02-12 DIAGNOSIS — J90 Pleural effusion, not elsewhere classified: Secondary | ICD-10-CM | POA: Diagnosis not present

## 2021-02-12 DIAGNOSIS — I10 Essential (primary) hypertension: Secondary | ICD-10-CM | POA: Diagnosis not present

## 2021-02-12 DIAGNOSIS — Z20822 Contact with and (suspected) exposure to covid-19: Secondary | ICD-10-CM | POA: Diagnosis not present

## 2021-02-12 DIAGNOSIS — R7303 Prediabetes: Secondary | ICD-10-CM | POA: Diagnosis present

## 2021-02-12 DIAGNOSIS — F32A Depression, unspecified: Secondary | ICD-10-CM | POA: Diagnosis present

## 2021-02-12 DIAGNOSIS — Z7901 Long term (current) use of anticoagulants: Secondary | ICD-10-CM | POA: Diagnosis not present

## 2021-02-12 DIAGNOSIS — K219 Gastro-esophageal reflux disease without esophagitis: Secondary | ICD-10-CM | POA: Diagnosis present

## 2021-02-12 DIAGNOSIS — I63443 Cerebral infarction due to embolism of bilateral cerebellar arteries: Secondary | ICD-10-CM | POA: Diagnosis not present

## 2021-02-12 DIAGNOSIS — E559 Vitamin D deficiency, unspecified: Secondary | ICD-10-CM | POA: Diagnosis not present

## 2021-02-12 DIAGNOSIS — H539 Unspecified visual disturbance: Secondary | ICD-10-CM | POA: Diagnosis present

## 2021-02-12 DIAGNOSIS — Q7649 Other congenital malformations of spine, not associated with scoliosis: Secondary | ICD-10-CM | POA: Diagnosis not present

## 2021-02-12 DIAGNOSIS — K5730 Diverticulosis of large intestine without perforation or abscess without bleeding: Secondary | ICD-10-CM | POA: Diagnosis not present

## 2021-02-12 DIAGNOSIS — I6389 Other cerebral infarction: Secondary | ICD-10-CM | POA: Diagnosis not present

## 2021-02-12 DIAGNOSIS — J189 Pneumonia, unspecified organism: Secondary | ICD-10-CM | POA: Diagnosis not present

## 2021-02-12 DIAGNOSIS — F411 Generalized anxiety disorder: Secondary | ICD-10-CM | POA: Diagnosis present

## 2021-02-12 DIAGNOSIS — E785 Hyperlipidemia, unspecified: Secondary | ICD-10-CM | POA: Diagnosis present

## 2021-02-12 DIAGNOSIS — I25118 Atherosclerotic heart disease of native coronary artery with other forms of angina pectoris: Secondary | ICD-10-CM | POA: Diagnosis not present

## 2021-02-12 DIAGNOSIS — I700 Atherosclerosis of aorta: Secondary | ICD-10-CM | POA: Diagnosis not present

## 2021-02-12 DIAGNOSIS — I2699 Other pulmonary embolism without acute cor pulmonale: Secondary | ICD-10-CM | POA: Diagnosis not present

## 2021-02-12 DIAGNOSIS — C3491 Malignant neoplasm of unspecified part of right bronchus or lung: Secondary | ICD-10-CM | POA: Diagnosis not present

## 2021-02-12 DIAGNOSIS — R29703 NIHSS score 3: Secondary | ICD-10-CM | POA: Diagnosis present

## 2021-02-12 DIAGNOSIS — D6869 Other thrombophilia: Secondary | ICD-10-CM | POA: Diagnosis not present

## 2021-02-12 DIAGNOSIS — R071 Chest pain on breathing: Secondary | ICD-10-CM | POA: Diagnosis not present

## 2021-02-12 DIAGNOSIS — M4696 Unspecified inflammatory spondylopathy, lumbar region: Secondary | ICD-10-CM | POA: Diagnosis not present

## 2021-02-12 DIAGNOSIS — Z515 Encounter for palliative care: Secondary | ICD-10-CM | POA: Diagnosis not present

## 2021-02-12 DIAGNOSIS — Z7189 Other specified counseling: Secondary | ICD-10-CM | POA: Diagnosis not present

## 2021-02-12 DIAGNOSIS — E876 Hypokalemia: Secondary | ICD-10-CM | POA: Diagnosis present

## 2021-02-12 DIAGNOSIS — G9389 Other specified disorders of brain: Secondary | ICD-10-CM | POA: Diagnosis not present

## 2021-02-12 DIAGNOSIS — R69 Illness, unspecified: Secondary | ICD-10-CM | POA: Diagnosis not present

## 2021-02-12 DIAGNOSIS — D630 Anemia in neoplastic disease: Secondary | ICD-10-CM | POA: Diagnosis not present

## 2021-02-12 DIAGNOSIS — R0602 Shortness of breath: Secondary | ICD-10-CM | POA: Diagnosis not present

## 2021-02-12 DIAGNOSIS — M1990 Unspecified osteoarthritis, unspecified site: Secondary | ICD-10-CM | POA: Diagnosis present

## 2021-02-12 LAB — COMPREHENSIVE METABOLIC PANEL
ALT: 31 U/L (ref 0–44)
AST: 29 U/L (ref 15–41)
Albumin: 2.8 g/dL — ABNORMAL LOW (ref 3.5–5.0)
Alkaline Phosphatase: 46 U/L (ref 38–126)
Anion gap: 6 (ref 5–15)
BUN: 12 mg/dL (ref 8–23)
CO2: 25 mmol/L (ref 22–32)
Calcium: 8 mg/dL — ABNORMAL LOW (ref 8.9–10.3)
Chloride: 106 mmol/L (ref 98–111)
Creatinine, Ser: 0.76 mg/dL (ref 0.44–1.00)
GFR, Estimated: >60 mL/min (ref >60)
Glucose, Bld: 102 mg/dL — ABNORMAL HIGH (ref 70–99)
Potassium: 2.9 mmol/L — ABNORMAL LOW (ref 3.5–5.1)
Sodium: 137 mmol/L (ref 135–145)
Total Bilirubin: 0.5 mg/dL (ref 0.3–1.2)
Total Protein: 5.7 g/dL — ABNORMAL LOW (ref 6.5–8.1)

## 2021-02-12 LAB — CULTURE, BLOOD (ROUTINE X 2)
Culture: NO GROWTH
Culture: NO GROWTH
Special Requests: ADEQUATE
Special Requests: ADEQUATE

## 2021-02-12 LAB — LIPID PANEL
Cholesterol: 175 mg/dL (ref 0–200)
HDL: 62 mg/dL (ref >40)
LDL Cholesterol: 91 mg/dL (ref 0–99)
Total CHOL/HDL Ratio: 2.8 RATIO
Triglycerides: 110 mg/dL (ref <150)
VLDL: 22 mg/dL (ref 0–40)

## 2021-02-12 LAB — CBC WITH DIFFERENTIAL/PLATELET
Abs Immature Granulocytes: 0.04 10*3/uL (ref 0.00–0.07)
Basophils Absolute: 0 10*3/uL (ref 0.0–0.1)
Basophils Relative: 0 %
Eosinophils Absolute: 0 10*3/uL (ref 0.0–0.5)
Eosinophils Relative: 0 %
HCT: 35.5 % — ABNORMAL LOW (ref 36.0–46.0)
Hemoglobin: 11.4 g/dL — ABNORMAL LOW (ref 12.0–15.0)
Immature Granulocytes: 0 %
Lymphocytes Relative: 7 %
Lymphs Abs: 0.7 10*3/uL (ref 0.7–4.0)
MCH: 26.3 pg (ref 26.0–34.0)
MCHC: 32.1 g/dL (ref 30.0–36.0)
MCV: 81.8 fL (ref 80.0–100.0)
Monocytes Absolute: 0.6 10*3/uL (ref 0.1–1.0)
Monocytes Relative: 6 %
Neutro Abs: 8.9 10*3/uL — ABNORMAL HIGH (ref 1.7–7.7)
Neutrophils Relative %: 87 %
Platelets: 152 10*3/uL (ref 150–400)
RBC: 4.34 MIL/uL (ref 3.87–5.11)
RDW: 14.6 % (ref 11.5–15.5)
WBC: 10.2 10*3/uL (ref 4.0–10.5)
nRBC: 0 % (ref 0.0–0.2)

## 2021-02-12 LAB — PHOSPHORUS: Phosphorus: 1.7 mg/dL — ABNORMAL LOW (ref 2.5–4.6)

## 2021-02-12 LAB — HEMOGLOBIN A1C
Hgb A1c MFr Bld: 5.7 % — ABNORMAL HIGH (ref 4.8–5.6)
Mean Plasma Glucose: 116.89 mg/dL

## 2021-02-12 LAB — MAGNESIUM: Magnesium: 1.9 mg/dL (ref 1.7–2.4)

## 2021-02-12 LAB — RESP PANEL BY RT-PCR (FLU A&B, COVID) ARPGX2
Influenza A by PCR: NEGATIVE
Influenza B by PCR: NEGATIVE
SARS Coronavirus 2 by RT PCR: NEGATIVE

## 2021-02-12 LAB — SAR COV2 SEROLOGY (COVID19)AB(IGG),IA: SARS-CoV-2 Ab, IgG: REACTIVE — AB

## 2021-02-12 MED ORDER — IOHEXOL 9 MG/ML PO SOLN
ORAL | Status: AC
  Filled 2021-02-12: qty 500

## 2021-02-12 MED ORDER — STROKE: EARLY STAGES OF RECOVERY BOOK
Freq: Once | Status: AC
  Filled 2021-02-12: qty 1

## 2021-02-12 MED ORDER — ACETAMINOPHEN 160 MG/5ML PO SOLN: 650.0000 mg | ORAL | Status: DC | PRN

## 2021-02-12 MED ORDER — PREDNISONE 20 MG PO TABS: 20.0000 mg | ORAL_TABLET | Freq: Every day | ORAL | Status: DC

## 2021-02-12 MED ORDER — EZETIMIBE 10 MG PO TABS
10.0000 mg | ORAL_TABLET | Freq: Every day | ORAL | Status: DC
  Administered 2021-02-12 – 2021-02-16 (×5): 10 mg via ORAL
  Filled 2021-02-12 (×5): qty 1

## 2021-02-12 MED ORDER — POTASSIUM CHLORIDE 10 MEQ/100ML IV SOLN
10.0000 meq | INTRAVENOUS | Status: AC
Start: 2021-02-12 — End: 2021-02-12
  Administered 2021-02-12 (×4): 10 meq via INTRAVENOUS
  Filled 2021-02-12 (×4): qty 100

## 2021-02-12 MED ORDER — LACTATED RINGERS IV SOLN: INTRAVENOUS | Status: DC

## 2021-02-12 MED ORDER — ALBUTEROL SULFATE (2.5 MG/3ML) 0.083% IN NEBU: 3.0000 mL | INHALATION_SOLUTION | RESPIRATORY_TRACT | Status: DC | PRN

## 2021-02-12 MED ORDER — IOHEXOL 9 MG/ML PO SOLN
500.0000 mL | ORAL | Status: AC
  Administered 2021-02-12 (×2): 500 mL via ORAL

## 2021-02-12 MED ORDER — POTASSIUM CHLORIDE CRYS ER 20 MEQ PO TBCR
40.0000 meq | EXTENDED_RELEASE_TABLET | Freq: Two times a day (BID) | ORAL | Status: AC
  Administered 2021-02-12 (×2): 40 meq via ORAL
  Filled 2021-02-12 (×2): qty 2

## 2021-02-12 MED ORDER — PREDNISONE 20 MG PO TABS
60.0000 mg | ORAL_TABLET | Freq: Once | ORAL | Status: AC
  Administered 2021-02-12: 60 mg via ORAL
  Filled 2021-02-12: qty 3

## 2021-02-12 MED ORDER — SENNOSIDES-DOCUSATE SODIUM 8.6-50 MG PO TABS: 1.0000 | ORAL_TABLET | Freq: Every evening | ORAL | Status: DC | PRN

## 2021-02-12 MED ORDER — HEPARIN (PORCINE) 25000 UT/250ML-% IV SOLN
900.0000 [IU]/h | INTRAVENOUS | Status: AC
  Administered 2021-02-12: 700 [IU]/h via INTRAVENOUS
  Administered 2021-02-13: 1000 [IU]/h via INTRAVENOUS
  Administered 2021-02-15: 900 [IU]/h via INTRAVENOUS
  Filled 2021-02-12 (×3): qty 250

## 2021-02-12 MED ORDER — PREDNISONE 20 MG PO TABS: 40.0000 mg | ORAL_TABLET | Freq: Every day | ORAL | Status: DC

## 2021-02-12 MED ORDER — APIXABAN 5 MG PO TABS
10.0000 mg | ORAL_TABLET | Freq: Two times a day (BID) | ORAL | Status: DC
  Administered 2021-02-12 (×2): 10 mg via ORAL
  Filled 2021-02-12 (×2): qty 2

## 2021-02-12 MED ORDER — PREDNISONE 5 MG PO TABS: 5.0000 mg | ORAL_TABLET | Freq: Every day | ORAL | Status: DC

## 2021-02-12 MED ORDER — PREDNISONE 20 MG PO TABS
60.0000 mg | ORAL_TABLET | Freq: Every day | ORAL | Status: AC
  Administered 2021-02-13: 60 mg via ORAL
  Filled 2021-02-12: qty 3

## 2021-02-12 MED ORDER — PREDNISONE 5 MG PO TABS: 10.0000 mg | ORAL_TABLET | Freq: Every day | ORAL | Status: DC

## 2021-02-12 MED ORDER — PREDNISONE 5 MG PO TABS: 10.0000 mg | ORAL_TABLET | ORAL | Status: DC

## 2021-02-12 MED ORDER — APIXABAN 5 MG PO TABS: 5.0000 mg | ORAL_TABLET | Freq: Two times a day (BID) | ORAL | Status: DC

## 2021-02-12 MED ORDER — ESOMEPRAZOLE MAGNESIUM 20 MG PO PACK: 20.0000 mg | PACK | Freq: Every day | ORAL | Status: DC

## 2021-02-12 MED ORDER — MAGNESIUM SULFATE 2 GM/50ML IV SOLN
2.0000 g | Freq: Once | INTRAVENOUS | Status: AC
  Administered 2021-02-12: 2 g via INTRAVENOUS
  Filled 2021-02-12: qty 50

## 2021-02-12 MED ORDER — IOHEXOL 350 MG/ML SOLN
75.0000 mL | Freq: Once | INTRAVENOUS | Status: AC | PRN
  Administered 2021-02-12: 75 mL via INTRAVENOUS

## 2021-02-12 MED ORDER — PANTOPRAZOLE SODIUM 40 MG PO PACK
40.0000 mg | PACK | Freq: Every day | ORAL | Status: DC
  Administered 2021-02-12 – 2021-02-16 (×5): 40 mg via ORAL
  Filled 2021-02-12 (×5): qty 20

## 2021-02-12 MED ORDER — ACETAMINOPHEN 650 MG RE SUPP: 650.0000 mg | RECTAL | Status: DC | PRN

## 2021-02-12 MED ORDER — APIXABAN 5 MG PO TABS: 10.0000 mg | ORAL_TABLET | Freq: Two times a day (BID) | ORAL | Status: DC

## 2021-02-12 MED ORDER — DORZOLAMIDE HCL-TIMOLOL MAL 2-0.5 % OP SOLN
1.0000 [drp] | Freq: Two times a day (BID) | OPHTHALMIC | Status: DC
  Administered 2021-02-12 – 2021-02-16 (×9): 1 [drp] via OPHTHALMIC
  Filled 2021-02-12 (×2): qty 10

## 2021-02-12 MED ORDER — ACETAMINOPHEN 325 MG PO TABS
650.0000 mg | ORAL_TABLET | ORAL | Status: DC | PRN
  Administered 2021-02-12: 650 mg via ORAL
  Filled 2021-02-12: qty 2

## 2021-02-12 MED ORDER — LATANOPROST 0.005 % OP SOLN
1.0000 [drp] | Freq: Every day | OPHTHALMIC | Status: DC
  Administered 2021-02-12 – 2021-02-15 (×4): 1 [drp] via OPHTHALMIC
  Filled 2021-02-12 (×2): qty 2.5

## 2021-02-12 NOTE — Consult Note (Signed)
Neurology Consultation Reason for Consult: Stroke versus mets on MRI Requesting Physician: Barb Merino  CC: Left-sided weakness and double vision  History is obtained from: Patient, daughter at bedside and chart review  HPI: Allison [name] is a 70 y.o. female with a past medical history significant for stage IV adenocarcinoma of the right lung (undergoing chemotherapy and radiation), hypercoagulable state secondary to malignancy (recent DVT and PE on Eliquis), hypertension, prediabetes, anxiety/depression  She was recently admitted 8/22-25 for fever, cough, and shortness of breath, found to have postobstructive pneumonia, pulmonary embolus, and DVT.  She was continued on a steroid taper that has been she started on an outpatient basis.  Regarding her PEs these were found to be small clot burden on CT without evidence of hemodynamic compromise or right ventricular strain and she was started on apixaban at discharge.  She was additionally continued on Keflex for pneumonia.   Unfortunately the morning after her discharge when she awoke she noted she had some weakness on the left side and Leaning towards the left with some double vision.  She presented to the ED for evaluation, and was found to have strokes on MRI.  Possibly discussed with telemetry specialists overnight though there is no neurology note.  Today primary team reached out for in person neurology evaluation  LKW: 8/27 evening tPA given?: No, out of the window Premorbid modified rankin scale: 1-2     1 - No significant disability. Able to carry out all usual activities, despite some symptoms.     2 - Slight disability. Able to look after own affairs without assistance, but unable to carry out all previous activities.  ROS: All other review of systems was negative except as noted --she additionally has been having headache which she does feel is worse when she lays down but does not wake her from sleep.  She feels it improves with  distraction of walking around.  Other than the diplopia that was sudden onset she has not had visual complaints or hearing complaints.  She has struggled significantly with cough secondary to her malignancy and the treatment thereof.  She has been short of breath, but denies any previous focal weakness or numbness.  Past Medical History:  Diagnosis Date   Anxiety    Arthritis    Asthma    exercise induced   Depression    PMH   GERD (gastroesophageal reflux disease)    Glaucoma    History of radiation therapy 01/05/2021   IMRT right lung  11/24/2020-01/05/2021  Dr [name]   Hypertension    lung ca dx'd 11/2017   right   Malignant pleural effusion    right   PONV (postoperative nausea and vomiting)    Pre-diabetes    Raynaud's disease    Raynaud's disease    Past Surgical History:  Procedure Laterality Date   ABDOMINAL HYSTERECTOMY     partial   CHEST TUBE INSERTION Right 01/01/2018   Procedure: INSERTION PLEURAL DRAINAGE CATHETER;  Surgeon: Ivin Poot, MD;  Location: [facility];  Service: Thoracic;  Laterality: Right;   COLONOSCOPY     CORONARY STENT INTERVENTION N/A 06/16/2019   Procedure: CORONARY STENT INTERVENTION;  Surgeon: Jettie Booze, MD;  Location: [facility];  Service: Cardiovascular;  Laterality: N/A;   DILATION AND CURETTAGE OF UTERUS     EYE SURGERY     due to Glaucoma   LEFT HEART CATH AND CORONARY ANGIOGRAPHY N/A 06/16/2019   Procedure: LEFT HEART CATH  AND CORONARY ANGIOGRAPHY;  Surgeon: Jettie Booze, MD;  Location: [facility];  Service: Cardiovascular;  Laterality: N/A;   REMOVAL OF PLEURAL DRAINAGE CATHETER Right 11/07/2018   Procedure: REMOVAL OF PLEURAL DRAINAGE CATHETER;  Surgeon: Ivin Poot, MD;  Location: [facility];  Service: Thoracic;  Laterality: Right;   ROTATOR CUFF REPAIR     TUBAL LIGATION     WISDOM TOOTH EXTRACTION     Current Outpatient Medications  Medication Instructions   acetaminophen (TYLENOL) 1,000 mg,  Oral, Every 6 hours PRN   albuterol (VENTOLIN HFA) 108 (90 Base) MCG/ACT inhaler 2 puffs, Inhalation, Every 6 hours PRN   ALPRAZolam (XANAX) 0.25 mg, Oral, 2 times daily PRN   amLODipine (NORVASC) 5 mg, Oral, Daily   APIXABAN (ELIQUIS) VTE STARTER PACK (10MG  AND 5MG ) Take as directed on package: start with two-5mg  tablets twice daily for 7 days. On day 8, switch to one-5mg  tablet twice daily.   aspirin 81 mg, Oral, Daily   b complex vitamins capsule 1 capsule, Oral, Daily   benzonatate (TESSALON) 200 mg, Oral, 3 times daily PRN   carvedilol (COREG) 6.25 MG tablet TAKE 1 TABLET BY MOUTH 2 TIMES DAILY WITH A MEAL. PLEASE SCHEDULE APPOINTMENT FOR FUTURE REFILLS.   cephALEXin (KEFLEX) 500 mg, Oral, Every 8 hours   cetirizine (ZYRTEC) 5 mg, Oral, Daily   Dextromethorphan HBr (DELSYM PO) 15 mLs, Oral, 2 times daily PRN   docusate sodium (COLACE) 100 mg, Oral, Daily   dorzolamide-timolol (COSOPT) 22.3-6.8 MG/ML ophthalmic solution 1 drop, Both Eyes, 2 times daily   esomeprazole (NEXIUM) 20 mg, Oral, Daily   ezetimibe (ZETIA) 10 mg, Oral, Daily   hydrochlorothiazide (HYDRODIURIL) 25 mg, Oral, Daily   latanoprost (XALATAN) 0.005 % ophthalmic solution 1 drop, Both Eyes, Daily at bedtime   morphine 2.4 mg, Oral, Every 4 hours PRN   predniSONE (DELTASONE) 10 MG tablet Take 6 tablets (60 mg total) by mouth daily for 3 days, THEN 4 tablets (40 mg total) daily for 3 days, THEN 2 tablets (20 mg total) daily for 3 days, THEN 1 tablet (10 mg total) daily for 3 days, THEN 0.5 tablets (5 mg total) daily for 2 days.   prochlorperazine (COMPAZINE) 10 mg, Oral, Every 6 hours PRN   sucralfate (CARAFATE) 1 g, Oral, 4 times daily, with meals and at bedtime. Crush and dissolve in 10 mL of warm water prior to swallowing   VITAMIN D-VITAMIN K PO 1 tablet, Oral, Daily     Family History  Problem Relation Age of Onset   Heart disease Sister    Heart disease Brother    Lung cancer Other    Social History:  reports  that she has never smoked. She has never used smokeless tobacco. She reports that she does not currently use alcohol. She reports that she does not use drugs.   Exam: Current vital signs: BP (!) 156/81 (BP Location: Right Arm)   Pulse 78   Temp 98.1 F (36.7 C) (Oral)   Resp 18   LMP  (LMP Unknown)   SpO2 100%  Vital signs in last 24 hours: Temp:  [98.1 F (36.7 C)] 98.1 F (36.7 C) (08/27 1700) Pulse Rate:  [62-90] 78 (08/27 1700) Resp:  [12-25] 18 (08/27 1700) BP: (126-180)/(69-116) 156/81 (08/27 1700) SpO2:  [91 %-100 %] 100 % (08/27 1700)   Physical Exam  Constitutional: Appears chronically ill and thin Psych: Affect bright, pleasant and cooperative Eyes: No scleral injection HENT: No oropharyngeal  obstruction.  MSK: no joint deformities.  Cardiovascular: Normal rate and regular rhythm.  Respiratory: Intermittent wracking cough GI: Soft.  No distension. There is no tenderness.  Skin: Warm dry and intact visible skin  Neuro: Mental Status: Patient is awake, alert, oriented to person, place, month, year, and situation. Patient is able to give a clear and coherent history. No signs of aphasia or neglect Cranial Nerves: II: Visual Fields are full. Pupils are equal, round, and reactive to light.   III,IV, VI: EOMI however she notes diplopia that is worsening primary gaze and improves on lateral gaze V: Facial sensation is symmetric to light touch VII: Facial movement is symmetric.  VIII: hearing is intact to voice X: Uvula elevates symmetrically XI: Shoulder shrug is symmetric. XII: tongue is midline without atrophy or fasciculations.  Motor: Tone is normal. Bulk is normal. 5/5 strength was present on the right side however on the left there is subtle 4+/5.  There is also clear drift of the left upper extremity on pronator drift testing Sensory: Sensation is symmetric to light touch and temperature in the arms and legs. Deep Tendon Reflexes: 2+ and symmetric in the  biceps and patellae.  Cerebellar: FNF and HKS are dysmetric with the left upper lower extremity out of proportion to weakness  NIHSS total 3 Score breakdown: One-point for left upper extremity drift, 2 points for left upper and lower extremity ataxia  I have reviewed labs in epic and the results pertinent to this consultation are: Creatinine 0.76 CBC with mild anemia of hemoglobin 11.4 (normocytic) Lab Results  Component Value Date   HGBA1C 5.7 (H) 02/12/2021   Lab Results  Component Value Date   CHOL 175 02/12/2021   HDL 62 02/12/2021   LDLCALC 91 02/12/2021   TRIG 110 02/12/2021   CHOLHDL 2.8 02/12/2021   02/08/2021 echocardiogram  1. Left ventricular ejection fraction, by estimation, is 60 to 65%. The left ventricle has normal function. The left ventricle has no regional wall motion abnormalities. Left ventricular diastolic parameters were normal.   2. Right ventricular systolic function is normal. The right ventricular size is normal. There is normal pulmonary artery systolic pressure. The estimated right ventricular systolic pressure is 14.7 mmHg.   3. The mitral valve is normal in structure. Mild to moderate mitral valve regurgitation. No evidence of mitral stenosis.   4. Tricuspid valve regurgitation is mild to moderate.   5. The aortic valve is normal in structure. Aortic valve regurgitation is not visualized. No aortic stenosis is present.   6. The inferior vena cava is normal in size with greater than 50% respiratory variability, suggesting right atrial pressure of 3 mmHg.  Normal biatrial sizes  I have reviewed the images obtained:  MRI brain:  1. Patchy multifocal acute to subacute ischemic infarcts involving the bilateral cerebral and cerebellar hemispheres. Associated small volume petechial blood products at the right cerebellum without frank hemorrhagic transformation. No significant mass effect. 2. Associated scattered areas of patchy enhancement as  above, favored to be secondary to subacute ischemic changes. A short interval follow-up MRI to ensure these changes resolve is recommended, particularly given the patient history of stage IV lung cancer and to ensure no underlying metastatic disease is present. 3. Abnormal flow void within the left vertebral artery, which could be related to slow flow and/or occlusion. 4. Underlying chronic microvascular ischemic disease.  Assessment: Multifocal strokes in the setting of hypercoagulable state secondary to lung adenocarcinoma, with the possibility of some CNS metastatic disease.  It is reassuring that she has been tolerating her apixaban dosing on 8/26 as well as this morning, however for safety we will transition to heparin drip for now and lower the permissive hypertension goal from standard to less than 180 given that she has been tolerating less than 180 without neurological decline  Recommendations: #Multifocal embolic appearing strokes likely secondary to hypercoagulable state in the setting of malignancy - Stroke labs TSH, HgbA1c, fasting lipid panel - MRI brain with and without contrast should be repeated in a few weeks to assess for potential metastases versus stroke - CTA  - Frequent neuro checks (every 2 hours for the first 12 hours) - Echocardiogram - Given relatively small size of strokes and patient clearly hypercoagulable (bilateral DVTs, PEs, multifocal embolic appearing strokes), agree with overnight recommendations that risk of withholding anticoagulation is higher than the risk of hemorrhage from proceeding.  Discussed this risk with family and patient they are in agreement to anticoagulate with close neurological monitoring - Hold antiplatelets in the setting of anticoagulation - Long-term when patient is ready to transition to outpatient anticoagulation, would favor Lovenox over DOAC's - Risk factor modification - Telemetry monitoring; 30 day event monitor on discharge if no  arrythmias captured  - Blood pressure goal: as patient has been neurologically stable with blood pressures in the 150s to 180s, and given hemorrhage risk in the setting of acute stroke on anticoagulation, we will proceed with blood pressure - PT consult, OT consult, Speech consult - Patient is requesting continuation of her antibiotics and steroids as prescribed at discharge, defer to primary team on management of this and other comorbidities - Stroke team to follow  [place] [phone] Available 7 PM to 7 AM, outside of these hours please call Neurologist on call as listed on Amion. Patient additionally discussed via telephone with my partner Dr. Cheral Marker who can be reached with any questions overnight, as well as hospitalist covering the patient overnight in person

## 2021-02-12 NOTE — ED Notes (Signed)
PT reports pain has resolved

## 2021-02-12 NOTE — Evaluation (Signed)
Occupational Therapy Evaluation Patient Details Name: Allison [name] MRN: [id number] DOB: June 03, 1951 Today's Date: 02/12/2021    History of Present Illness Patient is a 70 year old female presenting to ED with blurry vision gait deviations, listing to L. MRI reveal Patchy multifocal acute to subacute ischemic infarcts involving  the bilateral cerebral and cerebellar hemispheres. Patient recently D/C from hospital after treatment for PNA, acute PT and DVT. PMH includes lung CA, CAD, MI, glaucoma   Clinical Impression   Patient lives alone in single level house with 4 steps to enter, independent at baseline with self care. Reports daughters will be available at D/C to assist as needed. Currently patient presents with impaired balance and visual assessment. Undershooting noted with dysmetria testing L greater than R. Patient also ambulating slower than baseline taking small almost tenuous steps, patient reports has been listing to the L but was not observed during OT eval. Instruct patient in use of rolling walker and was able to navigate in room with improved gait speed "I can walk a little faster." Bilateral upper extremity strength, AROM and fine motor coordination appear intact at this time. Acute OT to follow and would recommend neuro outpatient follow up especially for patient's vision as she reports worsening vision for past few weeks. Recently got new glasses but has not helped, reporting increased blurriness from baseline (does have glaucoma) and that she is noticing deficits with her depth perception.     Follow Up Recommendations  Other (comment) (neuro-ophthalmologist assessment)    Equipment Recommendations  Other (comment) (rolling walker)       Precautions / Restrictions Precautions Precautions: Fall Restrictions Weight Bearing Restrictions: No      Mobility Bed Mobility Overal bed mobility: Independent                  Transfers Overall transfer level: Needs  assistance Equipment used: None Transfers: Sit to/from Stand Sit to Stand: Supervision         General transfer comment: for safety    Balance Overall balance assessment: Needs assistance Sitting-balance support: Feet supported Sitting balance-Leahy Scale: Good     Standing balance support: No upper extremity supported Standing balance-Leahy Scale: Fair Standing balance comment: static stand without difficulty, with ambulation min G for safety and gait speed improved with walker                           ADL either performed or assessed with clinical judgement   ADL Overall ADL's : Needs assistance/impaired     Grooming: Supervision/safety;Standing   Upper Body Bathing: Independent;Sitting   Lower Body Bathing: Min guard;Sit to/from stand   Upper Body Dressing : Independent;Sitting   Lower Body Dressing: Min guard;Sit to/from stand Lower Body Dressing Details (indicate cue type and reason): Seated patient able to perform figure 4 to doff/don shoes. Min G in standing due to impaired gait Toilet Transfer: Min guard;Ambulation Toilet Transfer Details (indicate cue type and reason): Patient min G with ambulation in room without AD due to mild unsteadiness taking small and slow steps. With walker patient supervision, improved gait speed Toileting- Clothing Manipulation and Hygiene: Min guard;Sit to/from stand       Functional mobility during ADLs: Supervision/safety;Rolling walker General ADL Comments: Patient appears close to baseline however note gait deviations from her baseline including taking small tenuous steps and increased time "I don't want to fall" Gait speed and balance improved with use of rolling walker  Vision Baseline Vision/History: 3 Glaucoma Patient Visual Report: Other (comment) (patient reports changes in vision past few weeks, just got new glasses but still having blurred vision) Vision Assessment?: Yes Eye Alignment: Within Functional  Limits Ocular Range of Motion: Within Functional Limits Alignment/Gaze Preference: Within Defined Limits Tracking/Visual Pursuits: Decreased smoothness of eye movement to LEFT superior field Depth Perception: Undershoots Additional Comments: dysmetria noted greater L vs R with undershooting target. patient reports her depth perception feels off from baseline                Pertinent Vitals/Pain Pain Assessment: Faces Faces Pain Scale: Hurts little more Pain Location: face/nose Pain Descriptors / Indicators: Sore Pain Intervention(s): Monitored during session;Premedicated before session     Hand Dominance Right   Extremity/Trunk Assessment Upper Extremity Assessment Upper Extremity Assessment: Overall WFL for tasks assessed   Lower Extremity Assessment Lower Extremity Assessment: Defer to PT evaluation   Cervical / Trunk Assessment Cervical / Trunk Assessment: Normal   Communication Communication Communication: No difficulties   Cognition Arousal/Alertness: Awake/alert Behavior During Therapy: WFL for tasks assessed/performed Overall Cognitive Status: Within Functional Limits for tasks assessed                                                Home Living Family/patient expects to be discharged to:: Private residence Living Arrangements: Alone Available Help at Discharge: Available PRN/intermittently Type of Home: House Home Access: Stairs to enter [employer] of Steps: 4 front Entrance Stairs-Rails: Right;Left Home Layout: One level     Bathroom Shower/Tub: Occupational psychologist: Handicapped height     Home Equipment: Shower seat - built in          Prior Functioning/Environment Level of Independence: Independent                 OT Problem List: Impaired balance (sitting and/or standing);Impaired vision/perception      OT Treatment/Interventions: Visual/perceptual remediation/compensation;Balance  training;Patient/family education;Self-care/ADL training    OT Goals(Current goals can be found in the care plan section) Acute Rehab OT Goals Patient Stated Goal: home OT Goal Formulation: With patient Time For Goal Achievement: 02/26/21 Potential to Achieve Goals: Good  OT Frequency: Min 2X/week    AM-PAC OT "6 Clicks" Daily Activity     Outcome Measure Help from another person eating meals?: None Help from another person taking care of personal grooming?: A Little Help from another person toileting, which includes using toliet, bedpan, or urinal?: A Little Help from another person bathing (including washing, rinsing, drying)?: A Little Help from another person to put on and taking off regular upper body clothing?: A Little Help from another person to put on and taking off regular lower body clothing?: A Little 6 Click Score: 19   End of Session Equipment Utilized During Treatment: Rolling walker Nurse Communication: Mobility status  Activity Tolerance: Patient tolerated treatment well Patient left: in bed;with call bell/phone within reach  OT Visit Diagnosis: Unsteadiness on feet (R26.81)                Time: 1540-0867 OT Time Calculation (min): 19 min Charges:  OT General Charges $OT Visit: 1 Visit OT Evaluation $OT Eval Moderate Complexity: [place] OT OT pager: [place] 02/12/2021, 11:51 AM

## 2021-02-12 NOTE — Progress Notes (Signed)
SLP Cancellation Note  Patient Details Name: Allison [name] MRN: [id number] DOB: 1951-04-19   Cancelled treatment:       Reason Eval/Treat Not Completed: Medical issues which prohibited therapy;Other (comment) (Patient resting after having severe abdominal pain. SLP will f/u next date schedule permitting for SLE.)   Sonia Baller, MA, CCC-SLP Speech Therapy

## 2021-02-12 NOTE — ED Notes (Signed)
PT woke up from sleep with severe right sided abdominal pain, she reports this pain is new for her. DR. Sheikh informed. Plan is to obtain ct abdomen pelvis and continue with transport to [facility]. VSS. Pt denies any other complaints

## 2021-02-12 NOTE — Plan of Care (Signed)
  Problem: Education: Goal: Knowledge of General Education information will improve Description Including pain rating scale, medication(s)/side effects and non-pharmacologic comfort measures Outcome: Progressing   

## 2021-02-12 NOTE — ED Notes (Signed)
Patient was given her lunch tray. 

## 2021-02-12 NOTE — H&P (Signed)
History and Physical    Allison [name] YWV:[id number] DOB: January 06, 1951 DOA: 02/11/2021  PCP: Ronnell Freshwater, NP   Patient coming from: Home  Chief Complaint: visual change, abnormal gait.  HPI: Allison [name] is a 70 y.o. female with medical history significant for HTN, adenocarcinoma of right lung followed by oncology and had radiation, recent PE and DVT which she was hospitalized for and discharged yesterday on Eliquis.  She reports that when she went to bed on the evening of February 10, 2021 around 830 she was in her normal state of health and felt well.  When she woke up on the morning of August 26 and 22 she had blurry vision and felt like her gait was off and she was always pointed toward the left.  She did have dizziness as well when she woke up that morning.  She woke up on August 26 at 8 AM.  The symptoms persisted all day and she came to the emergency room for evaluation.  She does report having some mild shortness of breath with exertion but it goes away when she sits and rests.  She felt this was just secondary to her lung cancer and recent PE.  She states she has not had any fevers, chills, abdominal pain, urinary symptoms, chest pain or palpitations, slurred speech, drooping face, numbness or weakness of extremities.  She had no loss of consciousness or seizure-like activity.  She had no bowel or bladder incontinence.  Denies tobacco alcohol or illicit drug use.  ED Course: Ms. Sanagustin has been hemodynamically stable in the emergency room blood pressure ranging from 142-172/66-110.  CT of the head showed a small 8 mm right cerebellar area that is probable subacute versus acute CVA. MRI of the brain was obtained and showed Patchy multifocal acute to subacute ischemic infarcts involving the bilateral cerebral and cerebellar hemispheres. Associated small volume petechial blood products at the right cerebellum without frank hemorrhagic transformation.  Blood work revealed a CBC that was  unremarkable.  CMP showed sodium 139 potassium 2.8 chloride 99 bicarb 28 creatinine 1.02 BUN 19 glucose 239 alkaline phosphatase 58 AST 39 ALT 43.  COVID-19 swab negative.  Influenza A and B are negative.  Discussed case with telemetry neurology who recommended patient stay on Eliquis for anticoagulation as the risk of stopping it outweighed the risk of staying on it.  Telemetry neurology will be consulted in the morning.  Teleneurology also recommended repeating the CT scan in the morning to make sure the area on CT that was possible small blood products were not enlarging.  Also recommended having low threshold to repeat CT stat if patient develops new neurological symptoms or condition changed.  Review of Systems:  General: Reports dizziness. Denies fever, chills, weight loss, night sweats. Denies change in appetite HENT: Denies head trauma, headache, denies change in hearing, tinnitus.  Denies nasal congestion.  Denies sore throat.  Denies difficulty swallowing Eyes: Denies pain in eye, drainage. Denies discoloration of eyes. Neck: Denies pain.  Denies swelling.  Denies pain with movement. Cardiovascular: Denies chest pain, palpitations.  Denies edema.  Denies orthopnea Respiratory: Denies shortness of breath, cough.  Denies wheezing.  Denies sputum production Gastrointestinal: Denies abdominal pain, swelling. Denies nausea, vomiting, diarrhea. Denies melena. Denies hematemesis. Musculoskeletal: Denies limitation of movement.  Denies deformity or swelling. Denies arthralgias or myalgias. Genitourinary: Denies pelvic pain.  Denies urinary frequency or hesitancy.  Denies dysuria.  Skin: Denies rash.  Denies petechiae, purpura, ecchymosis. Neurological: Denies headache.  Denies syncope. Denies seizure activity. Denies paresthesia.  Denies slurred speech, drooping face. Reports blurry vision. Reports abnormal gait. Psychiatric: Denies depression, anxiety.  Denies hallucinations.  Past Medical  History:  Diagnosis Date   Anxiety    Arthritis    Asthma    exercise induced   Depression    PMH   GERD (gastroesophageal reflux disease)    Glaucoma    History of radiation therapy 01/05/2021   IMRT right lung  11/24/2020-01/05/2021  Dr [name]   Hypertension    lung ca dx'd 11/2017   right   Malignant pleural effusion    right   PONV (postoperative nausea and vomiting)    Pre-diabetes    Raynaud's disease    Raynaud's disease     Past Surgical History:  Procedure Laterality Date   ABDOMINAL HYSTERECTOMY     partial   CHEST TUBE INSERTION Right 01/01/2018   Procedure: INSERTION PLEURAL DRAINAGE CATHETER;  Surgeon: Ivin Poot, MD;  Location: [facility];  Service: Thoracic;  Laterality: Right;   COLONOSCOPY     CORONARY STENT INTERVENTION N/A 06/16/2019   Procedure: CORONARY STENT INTERVENTION;  Surgeon: Jettie Booze, MD;  Location: [facility];  Service: Cardiovascular;  Laterality: N/A;   DILATION AND CURETTAGE OF UTERUS     EYE SURGERY     due to Glaucoma   LEFT HEART CATH AND CORONARY ANGIOGRAPHY N/A 06/16/2019   Procedure: LEFT HEART CATH AND CORONARY ANGIOGRAPHY;  Surgeon: Jettie Booze, MD;  Location: [facility];  Service: Cardiovascular;  Laterality: N/A;   REMOVAL OF PLEURAL DRAINAGE CATHETER Right 11/07/2018   Procedure: REMOVAL OF PLEURAL DRAINAGE CATHETER;  Surgeon: Ivin Poot, MD;  Location: [facility];  Service: Thoracic;  Laterality: Right;   [place] EXTRACTION      Social History  reports that she has never smoked. She has never used smokeless tobacco. She reports that she does not currently use alcohol. She reports that she does not use drugs.  Allergies  Allergen Reactions   Penicillins Other (See Comments)    SYNCOPE PATIENT HAS HAD A PCN REACTION WITH IMMEDIATE RASH, FACIAL/TONGUE/THROAT SWELLING, SOB, OR LIGHTHEADEDNESS WITH HYPOTENSION:  #  #  YES  #  # Has patient  had a PCN reaction causing severe rash involving mucus membranes or skin necrosis: No Has patient had a PCN reaction that required hospitalization: No Has patient had a PCN reaction occurring within the last 10 years: No    Vicodin [Hydrocodone-Acetaminophen] Other (See Comments)    Sped up heart and breathing   Other Other (See Comments)    Glaucoma eye drop    Family History  Problem Relation Age of Onset   Heart disease Sister    Heart disease Brother    Lung cancer Other      Prior to Admission medications   Medication Sig Start Date End Date Taking? Authorizing Provider  acetaminophen (TYLENOL) 500 MG tablet Take 1,000 mg by mouth every 6 (six) hours as needed (pain).   Yes [provider]  albuterol (VENTOLIN HFA) 108 (90 Base) MCG/ACT inhaler Inhale 2 puffs into the lungs every 6 (six) hours as needed for wheezing or shortness of breath. 02/02/21  Yes Boscia, Greer Ee, NP  ALPRAZolam (XANAX) 0.25 MG tablet Take 0.25 mg by mouth 2 (two) times daily as needed for anxiety.    Yes [provider]  amLODipine (NORVASC) 5 MG tablet Take 5 mg by mouth daily. 11/23/20  Yes [provider]  APIXABAN Arne Cleveland) VTE STARTER PACK (10MG  AND 5MG ) Take as directed on package: start with two-5mg  tablets twice daily for 7 days. On day 8, switch to one-5mg  tablet twice daily. 02/10/21  Yes Donne Hazel, MD  aspirin 81 MG chewable tablet Chew 1 tablet (81 mg total) by mouth daily. 06/18/19  Yes Georgette Shell, MD  b complex vitamins capsule Take 1 capsule by mouth daily.   Yes [provider]  carvedilol (COREG) 6.25 MG tablet TAKE 1 TABLET BY MOUTH 2 TIMES DAILY WITH A MEAL. PLEASE SCHEDULE APPOINTMENT FOR FUTURE REFILLS. Patient taking differently: Take 6.25 mg by mouth 2 (two) times daily with a meal. 12/08/20  Yes Donato Heinz, MD  cephALEXin (KEFLEX) 500 MG capsule Take 1 capsule (500 mg total) by mouth every 8 (eight) hours for 3 days. 02/10/21  02/13/21 Yes Donne Hazel, MD  cetirizine (ZYRTEC) 5 MG tablet Take 5 mg by mouth daily.   Yes [provider]  Dextromethorphan HBr (DELSYM PO) Take 15 mLs by mouth 2 (two) times daily as needed.   Yes [provider]  docusate sodium (COLACE) 100 MG capsule Take 1 capsule (100 mg total) by mouth daily. 02/10/21 03/12/21 Yes Donne Hazel, MD  dorzolamide-timolol (COSOPT) 22.3-6.8 MG/ML ophthalmic solution Place 1 drop into both eyes 2 (two) times daily.   Yes [provider]  esomeprazole (NEXIUM) 20 MG packet Take 20 mg by mouth daily.   Yes [provider]  ezetimibe (ZETIA) 10 MG tablet Take 10 mg by mouth daily.    Yes [provider]  hydrochlorothiazide (HYDRODIURIL) 25 MG tablet Take 25 mg by mouth daily.   Yes [provider]  latanoprost (XALATAN) 0.005 % ophthalmic solution Place 1 drop into both eyes at bedtime.  12/14/17  Yes [provider]  predniSONE (DELTASONE) 10 MG tablet Take 6 tablets (60 mg total) by mouth daily for 3 days, THEN 4 tablets (40 mg total) daily for 3 days, THEN 2 tablets (20 mg total) daily for 3 days, THEN 1 tablet (10 mg total) daily for 3 days, THEN 0.5 tablets (5 mg total) daily for 2 days. 02/10/21 02/24/21 Yes Donne Hazel, MD  prochlorperazine (COMPAZINE) 10 MG tablet Take 1 tablet (10 mg total) by mouth every 6 (six) hours as needed for nausea or vomiting. 12/27/20  Yes Curt Bears, MD  VITAMIN D-VITAMIN K PO Take 1 tablet by mouth daily.   Yes [provider]  benzonatate (TESSALON) 200 MG capsule Take 1 capsule (200 mg total) by mouth 3 (three) times daily as needed for cough. Patient not taking: Reported on 02/11/2021 02/10/21   Donne Hazel, MD  morphine 20 MG/5ML solution Take 0.6 mLs (2.4 mg total) by mouth every 4 (four) hours as needed (shortness of breath). 02/10/21   Donne Hazel, MD  sucralfate (CARAFATE) 1 g tablet Take 1 tablet (1 g total) by mouth 4 (four) times  daily. with meals and at bedtime. Crush and dissolve in 10 mL of warm water prior to swallowing Patient not taking: Reported on 02/11/2021 02/11/21   Kyung Rudd, MD    Physical Exam: Vitals:   02/11/21 2300 02/11/21 2330 02/12/21 0000 02/12/21 0030  BP: (!) 153/81 (!) 157/85 (!) 164/82 (!) 157/116  Pulse: 67 70 70 70  Resp: 19 19 18 14   Temp:  TempSrc:      SpO2: 97% 99% 98% 98%    Constitutional: NAD, calm, comfortable Vitals:   02/11/21 2300 02/11/21 2330 02/12/21 0000 02/12/21 0030  BP: (!) 153/81 (!) 157/85 (!) 164/82 (!) 157/116  Pulse: 67 70 70 70  Resp: 19 19 18 14   Temp:      TempSrc:      SpO2: 97% 99% 98% 98%   General: WDWN, Alert and oriented x3.  Eyes: EOMI, PERRL,  conjunctivae normal.  Sclera nonicteric HENT:  [place]/AT, external ears normal.  Nares patent without epistasis.  Mucous membranes are moist. Posterior pharynx clear   Neck: Soft, normal range of motion, supple, no masses, Trachea midline Respiratory: clear to auscultation bilaterally, no wheezing, no crackles. Normal respiratory effort. No accessory muscle use.  Cardiovascular: Regular rate and rhythm, no murmurs / rubs / gallops. No extremity edema. 2+ pedal pulses.  Abdomen: Soft, no tenderness, nondistended, no rebound or guarding.  No masses palpated. Bowel sounds normoactive Musculoskeletal: FROM. no cyanosis. No joint deformity upper and lower extremities. Normal muscle tone.  Skin: Warm, dry, intact no rashes, lesions, ulcers. No induration.  Normal skin turgor Neurologic: CN 2-12 grossly intact. Normal speech. Sensation intact to touch, patella DTR +1 bilaterally. Strength 5/5 in all extremities. No tremor. No pronator drift. Normal heel to shin Psychiatric: Normal judgment and insight.  Normal mood.    Labs on Admission: I have personally reviewed following labs and imaging studies  CBC: Recent Labs  Lab 02/07/21 1640 02/08/21 0154 02/10/21 0759 02/11/21 1811  WBC 9.2 9.4 13.1* 9.2   NEUTROABS 7.4 8.7*  --  8.5*  HGB 12.0 11.0* 10.8* 12.8  HCT 37.3 34.4* 33.5* 39.8  MCV 81.6 81.5 81.3 81.4  PLT 162 161 166 161    Basic Metabolic Panel: Recent Labs  Lab 02/07/21 1640 02/07/21 2057 02/08/21 0154 02/10/21 0759 02/11/21 1811  NA 136  --  138 139 139  K 3.6  --  3.8 3.0* 2.8*  CL 100  --  106 104 99  CO2 25  --  24 25 28   GLUCOSE 105*  --  194* 84 239*  BUN 11  --  14 14 19   CREATININE 0.90  --  0.96 0.90 1.02*  CALCIUM 9.5  --  9.1 9.1 9.5  MG  --  2.0 2.1 2.2  --   PHOS  --  3.3 2.1* 2.5  --     GFR: Estimated Creatinine Clearance: 44.3 mL/min (A) (by C-G formula based on SCr of 1.02 mg/dL (H)).  Liver Function Tests: Recent Labs  Lab 02/07/21 1640 02/08/21 0154 02/11/21 1811  AST 39 34 39  ALT 31 28 43  ALKPHOS 52 45 58  BILITOT 0.8 0.5 0.3  PROT 7.4 6.8 7.0  ALBUMIN 3.7 3.3* 3.5    Urine analysis:    Component Value Date/Time   COLORURINE STRAW (A) 02/07/2021 2050   APPEARANCEUR CLEAR 02/07/2021 2050   LABSPEC 1.005 02/07/2021 2050   [place] 6.0 02/07/2021 2050   GLUCOSEU NEGATIVE 02/07/2021 2050   [place] (A) 02/07/2021 2050   [place] NEGATIVE 02/07/2021 2050   [place] 02/07/2021 2050   PROTEINUR NEGATIVE 02/07/2021 2050   NITRITE NEGATIVE 02/07/2021 2050   LEUKOCYTESUR NEGATIVE 02/07/2021 2050    Radiological Exams on Admission: CT Head Wo Contrast  Result Date: 02/11/2021 CLINICAL DATA:  Sensation of everything in body being pulled to the left EXAM: CT HEAD WITHOUT CONTRAST TECHNIQUE: Contiguous axial images were obtained  from the base of the skull through the vertex without intravenous contrast. COMPARISON:  MR brain dated 11/09/2020 FINDINGS: Brain: No evidence of acute infarction, hemorrhage, hydrocephalus, extra-axial collection or mass effect. However, there is a subtle possible 8 mm hypodense in the right cerebellum (series 2/image 9), not evident on prior MR, raising concern for small acute/subacute  infarct or possibly a metastasis given the history of prior lung cancer. Vascular: No hyperdense vessel or unexpected calcification. Skull: Normal. Negative for fracture or focal lesion. Sinuses/Orbits: The visualized paranasal sinuses are essentially clear. The mastoid air cells are unopacified. Other: None. IMPRESSION: Possible 8 mm lesion in the right cerebellum, equivocal. This was not evident on prior MRI, raising concern for small acute/subacute infarct or possibly a metastasis. Consider MRI for further characterization. Electronically Signed   By: Julian Hy M.D.   On: 02/11/2021 19:11   MR Brain W and Wo Contrast  Result Date: 02/11/2021 CLINICAL DATA:  Initial evaluation for neuro deficit, question CVA versus metastatic disease. History of stage IV lung cancer. EXAM: MRI HEAD WITHOUT AND WITH CONTRAST TECHNIQUE: Multiplanar, multiecho pulse sequences of the brain and surrounding structures were obtained without and with intravenous contrast. CONTRAST:  67mL GADAVIST GADOBUTROL 1 MMOL/ML IV SOLN COMPARISON:  Head CT from earlier the same day as well as previous MRI from 11/09/2020. FINDINGS: Brain: Cerebral volume within normal limits for age. Patchy T2/FLAIR signal abnormality seen involving the periventricular and deep white matter both cerebral hemispheres, nonspecific, but most likely related to chronic microvascular ischemic disease. Multiple scattered foci of diffusion abnormality are seen involving the cerebellum bilaterally, left slightly worse than right (series 5, images 56, 59). Additional patchy multifocal areas of diffusion abnormality seen involving the cortical and subcortical aspect of both cerebral hemispheres, most pronounced at the left occipital lobe (series 5, images 84, 82, 79, 69, 72). Foci involving the frontal parietal regions are somewhat watershed in distribution. Findings consistent with acute to subacute ischemic infarcts. Small focus of intrinsic T1 hyperintensity with  susceptibility artifact present at the right cerebellum, consistent with petechial blood products (series 11, image 35 and series 9, image 14). No frank hemorrhagic transformation. No visible hemorrhage on prior CT. Additionally, scattered foci of patchy enhancement seen about several of these areas of ischemia, felt to be most consistent with probable evolving subacute ischemic change (series 15, images 123, 83, 75, 68). No other definite mass lesion or definite evidence for intracranial metastatic disease. No mass effect or midline shift. No hydrocephalus or extra-axial fluid collection. Empty sella noted. Midline structures intact. Vascular: Abnormal flow void within the left vertebral artery, which could be related to slow flow and/or occlusion (series 10, image 2). Enhancement is seen within the left vertebral artery following contrast administration. Major intracranial vascular flow voids are otherwise maintained. Skull and upper cervical spine: Craniocervical junction within normal limits. Bone marrow signal intensity normal. No focal marrow replacing lesion. No scalp soft tissue abnormality. Sinuses/Orbits: Globes and orbital soft tissues within normal limits. Paranasal sinuses are largely clear. No mastoid effusion. Inner ear structures grossly normal. Other: None. IMPRESSION: 1. Patchy multifocal acute to subacute ischemic infarcts involving the bilateral cerebral and cerebellar hemispheres. Associated small volume petechial blood products at the right cerebellum without frank hemorrhagic transformation. No significant mass effect. 2. Associated scattered areas of patchy enhancement as above, favored to be secondary to subacute ischemic changes. A short interval follow-up MRI to ensure these changes resolve is recommended, particularly given the patient history of stage IV lung  cancer and to ensure no underlying metastatic disease is present. 3. Abnormal flow void within the left vertebral artery, which  could be related to slow flow and/or occlusion. 4. Underlying chronic microvascular ischemic disease. Electronically Signed   By: Jeannine Boga M.D.   On: 02/11/2021 23:11    EKG: Independently reviewed.  EKG shows normal sinus rhythm with no acute ST elevation or depression.  QTc 416  Echocardiogram from February 08, 2021 shows an EF of 60 to 65% with normal LV function and normal right ventricular function  Assessment/Plan Principal Problem:   CVA (cerebral vascular accident)  Ms. Heward is admitted to telemetry floor for CVA.   Had recent echocardiogram a few days ago, results are above Hypertension of 220/110 will be allowed for 24 hours per stroke protocol.  After which blood pressure will be slowly reduced to goal level. Continue anticoagulation therapy with Eliquis.  Discussed with teleneurology who reviewed patient's chart and scans and recommended Eliquis be continued as the risk of worsening blood clots with recent PE outweighed the risk of hemorrhage.  Teleneurology did recommend repeat CT scan of the head in the morning to follow the small blood products that were seen on the CT scan at time of admission to make sure they are not enlarging.  If patient has any new or sudden neurological deficits or change in condition we will get stat head CT at that time. Obtain official telemetry neurology consult in the morning Check lipid panel.  Check hemoglobin A1c Neurochecks per stroke protocol  Active Problems:    Hypokalemia Potassium will be replaced.  Recheck electrolytes and renal function in am    Adenocarcinoma of right lung, stage 4  Followed by oncology    Hypertension Will resume antihypertensives after 24 hour permissive hypertension period  Monitor BP    Pulmonary embolism  Continue eliquis. Check CBC in am    Leg DVT (deep venous thromboembolism), acute, bilateral  On eliquis.     Current use of long term anticoagulation     DVT prophylaxis: Pt is on  Eliquis for anticoagulation with recent diagnosis of PE and DVT.  Code Status:   Full Code  Family Communication:  Diagnosis and plan was discussed with patient and her daughter who is at bedside.  They both verbalized understanding and agree with plan.  Further recommendations to follow as clinical indicated Disposition Plan:   Patient is from:  Home  Anticipated DC to:  Home  Anticipated DC date:  Anticipate a 2 midnight or more stay in the hospital   Consults called:   Teleneurology will need consult in am.  Admission status:  Inpatient   Yevonne Aline Shakeda Pearse MD [facility]  How to contact the [facility] Attending or Consulting provider [place] or covering provider during after hours [place], for this patient?   Check the care team in [facility] and look for a) attending/consulting [facility] provider listed and b) the [facility] team listed Log into [email or web address] and use [place]'s universal password to access. If you do not have the password, please contact the hospital operator. Locate the [facility] provider you are looking for under [facility] and page to a number that you can be directly reached. If you still have difficulty reaching the provider, please page the [facility] (Director on Call) for the Hospitalists listed on amion for assistance.  02/12/2021, 1:49 AM

## 2021-02-12 NOTE — ED Notes (Signed)
[facility] called for transport 

## 2021-02-12 NOTE — Progress Notes (Signed)
ANTICOAGULATION CONSULT NOTE - Initial Consult  Pharmacy Consult for Heparin  Indication: recent pulmonary embolus on eliquis/acute stroke w/u  Allergies  Allergen Reactions   Penicillins Other (See Comments)    SYNCOPE PATIENT HAS HAD A PCN REACTION WITH IMMEDIATE RASH, FACIAL/TONGUE/THROAT SWELLING, SOB, OR LIGHTHEADEDNESS WITH HYPOTENSION:  #  #  YES  #  # Has patient had a PCN reaction causing severe rash involving mucus membranes or skin necrosis: No Has patient had a PCN reaction that required hospitalization: No Has patient had a PCN reaction occurring within the last 10 years: No    Vicodin [Hydrocodone-Acetaminophen] Other (See Comments)    Sped up heart and breathing   Other Other (See Comments)    Glaucoma eye drop    Patient Measurements:   Heparin Dosing Weight: 58 kg  Vital Signs: BP: 147/82 (08/27 1030) Pulse Rate: 81 (08/27 1030)  Labs: Recent Labs    02/10/21 0759 02/11/21 1811 02/12/21 1040  HGB 10.8* 12.8 11.4*  HCT 33.5* 39.8 35.5*  PLT 166 172 152  CREATININE 0.90 1.02* 0.76    Estimated Creatinine Clearance: 56.5 mL/min (by C-G formula based on SCr of 0.76 mg/dL).   Medical History: Past Medical History:  Diagnosis Date   Anxiety    Arthritis    Asthma    exercise induced   Depression    PMH   GERD (gastroesophageal reflux disease)    Glaucoma    History of radiation therapy 01/05/2021   IMRT right lung  11/24/2020-01/05/2021  Dr [name]   Hypertension    lung ca dx'd 11/2017   right   Malignant pleural effusion    right   PONV (postoperative nausea and vomiting)    Pre-diabetes    Raynaud's disease    Raynaud's disease     Medications:  Apixaban 10 mg bid x 7 > 5 mg bid on day 5 of loading phase  Assessment: 70 y/o F with a h/o lung cancer discharged on 8/25 with Eliquis for PE. Patient was admitted with neurologic symptoms and found to have multifocal acute and subacute ischemic infarcts. Plan is to transfer patient to [facility]  for neuro workup. Pharmacy consulted to dose and monitor transition to heparin infusion.   Goal of Therapy:  Heparin level 0.3-0.5 units/ml aPTT 66-85 seconds Monitor platelets by anticoagulation protocol: Yes   Plan:  Start heparin infusion at 700 units/hr @ 2200 Check aPTT level in 6 hours  Daily HL and aPTT until levels correlate Continue to monitor H&H and platelets  Ulice Dash D 02/12/2021,12:16 PM

## 2021-02-12 NOTE — Progress Notes (Addendum)
Care started prior to midnight in the emergency room and patient was admitted early this morning after midnight by Dr. Harrold Donath and I am in current agreement with his assessment and plan.  Additional changes to plan of care been made accordingly.  The patient is a 70 year old female with a past medical history significant for but limited to hypertension, adenocarcinoma the right lung followed by oncology and has had radiation, history of recent PE and DVT for which she was hospitalized and discharged or the day before yesterday currently on anticoagulation Eliquis.  She woke up on the morning of February 11, 2021 and had blurry vision and felt that her gait was off and that she pointed to the left.  She started having dizziness as well.  Currently complains of a headache.  Further work-up revealed that she had patchy multifocal acute to subacute ischemic infarcts involving the bilateral cerebral and cerebellar hemispheres.  There is also associated small volume petechial blood products in the right cerebellum without frank hemorrhagic transformation.  Telemetry neurology was consulted and recommended repeating CT scan in the morning to make sure that the area and the CT scan showed possible small blood was not enlarging.  Case was discussed with the in-house neurologist Dr. Adela Glimpse who recommends transfer to [facility] and stopping the Eliquis and placing the patient on a heparin drip and send the patient to intermediate care.  She will be going to [facility] for further stroke work-up management and evaluation and neurology is recommending blood pressure goal less than 180.  Patient was found to be hypokalemic as well this will be replete.  Further care per neurologist and stroke team and my colleague Dr. Barb Merino has been notified about the patient's arrival to [facility].

## 2021-02-12 NOTE — Progress Notes (Signed)
Looks like patient was readmitted to hospital 02/11/2021

## 2021-02-13 ENCOUNTER — Inpatient Hospital Stay (HOSPITAL_COMMUNITY): Payer: Medicare HMO

## 2021-02-13 DIAGNOSIS — I2699 Other pulmonary embolism without acute cor pulmonale: Secondary | ICD-10-CM

## 2021-02-13 DIAGNOSIS — Z515 Encounter for palliative care: Secondary | ICD-10-CM

## 2021-02-13 DIAGNOSIS — I6389 Other cerebral infarction: Secondary | ICD-10-CM | POA: Diagnosis not present

## 2021-02-13 DIAGNOSIS — C3491 Malignant neoplasm of unspecified part of right bronchus or lung: Secondary | ICD-10-CM

## 2021-02-13 DIAGNOSIS — Z7189 Other specified counseling: Secondary | ICD-10-CM

## 2021-02-13 DIAGNOSIS — R0602 Shortness of breath: Secondary | ICD-10-CM

## 2021-02-13 DIAGNOSIS — I2510 Atherosclerotic heart disease of native coronary artery without angina pectoris: Secondary | ICD-10-CM

## 2021-02-13 DIAGNOSIS — R778 Other specified abnormalities of plasma proteins: Secondary | ICD-10-CM

## 2021-02-13 DIAGNOSIS — R071 Chest pain on breathing: Secondary | ICD-10-CM

## 2021-02-13 DIAGNOSIS — I6340 Cerebral infarction due to embolism of unspecified cerebral artery: Secondary | ICD-10-CM

## 2021-02-13 DIAGNOSIS — Z7901 Long term (current) use of anticoagulants: Secondary | ICD-10-CM

## 2021-02-13 LAB — COMPREHENSIVE METABOLIC PANEL
ALT: 46 U/L — ABNORMAL HIGH (ref 0–44)
AST: 36 U/L (ref 15–41)
Albumin: 3 g/dL — ABNORMAL LOW (ref 3.5–5.0)
Alkaline Phosphatase: 53 U/L (ref 38–126)
Anion gap: 7 (ref 5–15)
BUN: 10 mg/dL (ref 8–23)
CO2: 26 mmol/L (ref 22–32)
Calcium: 9.2 mg/dL (ref 8.9–10.3)
Chloride: 105 mmol/L (ref 98–111)
Creatinine, Ser: 0.96 mg/dL (ref 0.44–1.00)
GFR, Estimated: >60 mL/min (ref >60)
Glucose, Bld: 139 mg/dL — ABNORMAL HIGH (ref 70–99)
Potassium: 4.7 mmol/L (ref 3.5–5.1)
Sodium: 138 mmol/L (ref 135–145)
Total Bilirubin: 0.6 mg/dL (ref 0.3–1.2)
Total Protein: 6.3 g/dL — ABNORMAL LOW (ref 6.5–8.1)

## 2021-02-13 LAB — TROPONIN I (HIGH SENSITIVITY)
Troponin I (High Sensitivity): 1008 ng/L (ref <18)
Troponin I (High Sensitivity): 1013 ng/L (ref <18)

## 2021-02-13 LAB — CBC WITH DIFFERENTIAL/PLATELET
Abs Immature Granulocytes: 0.04 10*3/uL (ref 0.00–0.07)
Basophils Absolute: 0 10*3/uL (ref 0.0–0.1)
Basophils Relative: 0 %
Eosinophils Absolute: 0 10*3/uL (ref 0.0–0.5)
Eosinophils Relative: 0 %
HCT: 37.1 % (ref 36.0–46.0)
Hemoglobin: 12.1 g/dL (ref 12.0–15.0)
Immature Granulocytes: 1 %
Lymphocytes Relative: 7 %
Lymphs Abs: 0.5 10*3/uL — ABNORMAL LOW (ref 0.7–4.0)
MCH: 26 pg (ref 26.0–34.0)
MCHC: 32.6 g/dL (ref 30.0–36.0)
MCV: 79.6 fL — ABNORMAL LOW (ref 80.0–100.0)
Monocytes Absolute: 0.2 10*3/uL (ref 0.1–1.0)
Monocytes Relative: 2 %
Neutro Abs: 6.2 10*3/uL (ref 1.7–7.7)
Neutrophils Relative %: 90 %
Platelets: 158 10*3/uL (ref 150–400)
RBC: 4.66 MIL/uL (ref 3.87–5.11)
RDW: 14.8 % (ref 11.5–15.5)
WBC: 6.9 10*3/uL (ref 4.0–10.5)
nRBC: 0 % (ref 0.0–0.2)

## 2021-02-13 LAB — MAGNESIUM: Magnesium: 2.3 mg/dL (ref 1.7–2.4)

## 2021-02-13 LAB — APTT
aPTT: 44 seconds — ABNORMAL HIGH (ref 24–36)
aPTT: 48 seconds — ABNORMAL HIGH (ref 24–36)

## 2021-02-13 LAB — ECHOCARDIOGRAM LIMITED

## 2021-02-13 LAB — HEPARIN LEVEL (UNFRACTIONATED)
Heparin Unfractionated: >1.1 IU/mL — ABNORMAL HIGH (ref 0.30–0.70)
Heparin Unfractionated: >1.1 IU/mL — ABNORMAL HIGH (ref 0.30–0.70)

## 2021-02-13 LAB — PHOSPHORUS: Phosphorus: 2.7 mg/dL (ref 2.5–4.6)

## 2021-02-13 MED ORDER — MENTHOL 3 MG MT LOZG: 1.0000 | LOZENGE | OROMUCOSAL | Status: DC | PRN

## 2021-02-13 MED ORDER — HYDROCODONE BIT-HOMATROP MBR 5-1.5 MG/5ML PO SOLN
5.0000 mL | ORAL | Status: DC | PRN
  Filled 2021-02-13: qty 5

## 2021-02-13 MED ORDER — GUAIFENESIN-DM 100-10 MG/5ML PO SYRP
10.0000 mL | ORAL_SOLUTION | Freq: Four times a day (QID) | ORAL | Status: DC
  Administered 2021-02-13 – 2021-02-16 (×12): 10 mL via ORAL
  Filled 2021-02-13 (×12): qty 10

## 2021-02-13 MED ORDER — MOMETASONE FURO-FORMOTEROL FUM 100-5 MCG/ACT IN AERO
2.0000 | INHALATION_SPRAY | Freq: Two times a day (BID) | RESPIRATORY_TRACT | Status: DC
  Administered 2021-02-13 – 2021-02-16 (×7): 2 via RESPIRATORY_TRACT
  Filled 2021-02-13: qty 8.8

## 2021-02-13 MED ORDER — PREDNISONE 20 MG PO TABS
60.0000 mg | ORAL_TABLET | Freq: Every day | ORAL | Status: DC
  Administered 2021-02-14 – 2021-02-16 (×3): 60 mg via ORAL
  Filled 2021-02-13 (×4): qty 3

## 2021-02-13 MED ORDER — NITROGLYCERIN 2 % TD OINT
0.5000 [in_us] | TOPICAL_OINTMENT | Freq: Four times a day (QID) | TRANSDERMAL | Status: DC
  Administered 2021-02-13 – 2021-02-15 (×7): 0.5 [in_us] via TOPICAL
  Filled 2021-02-13: qty 30

## 2021-02-13 NOTE — Progress Notes (Signed)
[place] for Heparin  Indication: recent pulmonary embolus on apixaban, new embolic stroke  Allergies  Allergen Reactions   Penicillins Other (See Comments)    [facility] PATIENT HAS HAD A PCN REACTION WITH IMMEDIATE RASH, FACIAL/TONGUE/THROAT SWELLING, SOB, OR LIGHTHEADEDNESS WITH HYPOTENSION:  #  #  YES  #  # Has patient had a PCN reaction causing severe rash involving mucus membranes or skin necrosis: No Has patient had a PCN reaction that required hospitalization: No Has patient had a PCN reaction occurring within the last 10 years: No    Vicodin [Hydrocodone-Acetaminophen] Other (See Comments)    Sped up heart and breathing   Other Other (See Comments)    Glaucoma eye drop    Patient Measurements:   Heparin Dosing Weight: 58 kg  Vital Signs: Temp: 98.1 F (36.7 C) (08/28 0319) Temp Source: Oral (08/28 0319) BP: 154/87 (08/28 0319) Pulse Rate: 77 (08/28 0319)  Labs: Recent Labs    02/10/21 0759 02/11/21 1811 02/12/21 1040 02/13/21 0417  HGB 10.8* 12.8 11.4* 12.1  HCT 33.5* 39.8 35.5* 37.1  PLT 166 172 152 158  APTT  --   --   --  44*  HEPARINUNFRC  --   --   --  >1.10*  CREATININE 0.90 1.02* 0.76  --      Estimated Creatinine Clearance: 56.5 mL/min (by C-G formula based on SCr of 0.76 mg/dL).   Medical History: Past Medical History:  Diagnosis Date   Anxiety    Arthritis    Asthma    exercise induced   Depression    PMH   GERD (gastroesophageal reflux disease)    Glaucoma    History of radiation therapy 01/05/2021   IMRT right lung  11/24/2020-01/05/2021  Dr [name]   Hypertension    lung ca dx'd 11/2017   right   Malignant pleural effusion    right   PONV (postoperative nausea and vomiting)    Pre-diabetes    Raynaud's disease    Raynaud's disease     Medications:  Apixaban 10 mg bid x 7 > 5 mg bid on day 5 of loading phase  Assessment: 70 y/o F with a h/o lung cancer discharged on 8/25 with  Eliquis for PE. Patient was admitted with neurologic symptoms and found to have multifocal acute and subacute ischemic infarcts. Plan is to transfer patient to [facility] for neuro workup. Pharmacy consulted to dose and monitor transition to heparin infusion.   8/28 AM update:  aPTT low  Goal of Therapy:  Heparin level 0.3-0.5 units/ml aPTT 66-85 seconds Monitor platelets by anticoagulation protocol: Yes   Plan:  Inc heparin to 850 units/hr 1300 heparin level and aPTT Monitor for bleeding  Narda Bonds, PharmD, BCPS Clinical Pharmacist Phone: [phone]

## 2021-02-13 NOTE — Progress Notes (Signed)
  Echocardiogram 2D Echocardiogram has been performed.  Allison [name] 02/13/2021, 4:06 PM

## 2021-02-13 NOTE — Consult Note (Signed)
Cardiology Consultation:   Patient ID: KIMIMILA TAUZIN MRN: [id number]; DOB: Mar 15, 1951  Admit date: 02/11/2021 Date of Consult: 02/13/2021  PCP:  Ronnell Freshwater, NP   [facility] Providers Cardiologist:  Donato Heinz, MD        Patient Profile:   KANDEE ESCALANTE is a 70 y.o. female with a hx of CAD s/p PCI 05/2019, hypertension, recent DVT/PE on apixaban, stage 4 lung cancer who is being seen 02/13/2021 for the evaluation of elevated troponins at the request of Dr. Posey Pronto.  History of Present Illness:   Ms. Probert was recently discharged 02/10/21 with new diagnosis of DVT/PE. She was started on apixaban. She presented 02/11/21 with visual disturbance and gait changes. Internal medicine and neurology evaluated the patient; found to have multifocal acute to subacute infarcts in bilateral cerebral and cerebellar hemispheres. Small volume petechial findings in R cerebellum without frank hemorrhagic transformation. Recommended to transition to heparin drip from apixaban with serial imaging.   Elevated troponin returned today, measuring 1008. Last hsTN 5 days ago was 149. She does have history of elevated troponin to >9000 over 1 year ago.  ECG done today shows NSR at 73 bpm without high risk findings or ischemic changes.  She has a history of CAD, with PCI to proximal RCA in 05/2019. She reports that during her heart attack, her symptoms were back pain radiating to her chest and going up to her jaw. Her recent pain is different. For several weeks, she has had chest tightness when exerting herself or speaking. It feels like she cannot take a deep breath. No other associated symptoms.  She asked me to speak with her daughters, which I did via conference call in the patient's room.  We reviewed her troponins. Also reviewed her recent PE, did discuss that inflammation from this can sometimes cause pleuritic chest pain. 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   She is about to go have a repeat limited echo.   Her main questions are regarding her hypertension (currently allowing permissive hypertension given stroke) and if she needs to stop the repatha. On her discharge summary from 02/10/21, it was noted to stop this. Unclear reason behind this, need to clarify.   Past Medical History:  Diagnosis Date   Anxiety    Arthritis    Asthma    exercise induced   Depression    PMH   GERD (gastroesophageal reflux disease)    Glaucoma    History of radiation therapy 01/05/2021   IMRT right lung  11/24/2020-01/05/2021  Dr [name]   Hypertension    lung ca dx'd 11/2017   right   Malignant pleural effusion    right   PONV (postoperative nausea and vomiting)    Pre-diabetes    Raynaud's disease    Raynaud's disease     Past Surgical History:  Procedure Laterality Date   ABDOMINAL HYSTERECTOMY     partial   CHEST TUBE INSERTION Right 01/01/2018   Procedure: INSERTION PLEURAL DRAINAGE CATHETER;  Surgeon: Ivin Poot, MD;  Location: [place];  Service: Thoracic;  Laterality: Right;   COLONOSCOPY     CORONARY STENT INTERVENTION N/A 06/16/2019   Procedure: CORONARY STENT INTERVENTION;  Surgeon: Jettie Booze, MD;  Location: [facility];  Service: Cardiovascular;  Laterality: N/A;   DILATION AND CURETTAGE OF UTERUS     EYE SURGERY  due to Glaucoma   LEFT HEART CATH AND CORONARY ANGIOGRAPHY N/A 06/16/2019   Procedure: LEFT HEART CATH AND CORONARY ANGIOGRAPHY;  Surgeon: Jettie Booze, MD;  Location: [place];  Service: Cardiovascular;  Laterality: N/A;   REMOVAL OF PLEURAL DRAINAGE CATHETER Right 11/07/2018   Procedure: REMOVAL OF PLEURAL DRAINAGE CATHETER;  Surgeon: Ivin Poot, MD;  Location: [place];  Service: Thoracic;  Laterality: Right;   ROTATOR CUFF REPAIR     TUBAL LIGATION     WISDOM TOOTH EXTRACTION        Home Medications:  Prior to Admission medications   Medication Sig Start Date End Date Taking? Authorizing Provider  acetaminophen (TYLENOL) 500 MG tablet Take 1,000 mg by mouth every 6 (six) hours as needed (pain).   Yes [provider]  albuterol (VENTOLIN HFA) 108 (90 Base) MCG/ACT inhaler Inhale 2 puffs into the lungs every 6 (six) hours as needed for wheezing or shortness of breath. 02/02/21  Yes Boscia, Greer Ee, NP  ALPRAZolam (XANAX) 0.25 MG tablet Take 0.25 mg by mouth 2 (two) times daily as needed for anxiety.    Yes [provider]  amLODipine (NORVASC) 5 MG tablet Take 5 mg by mouth daily. 11/23/20  Yes [provider]  APIXABAN Arne Cleveland) VTE STARTER PACK (10MG  AND 5MG ) Take as directed on package: start with two-5mg  tablets twice daily for 7 days. On day 8, switch to one-5mg  tablet twice daily. 02/10/21  Yes Donne Hazel, MD  aspirin 81 MG chewable tablet Chew 1 tablet (81 mg total) by mouth daily. 06/18/19  Yes Georgette Shell, MD  b complex vitamins capsule Take 1 capsule by mouth daily.   Yes [provider]  carvedilol (COREG) 6.25 MG tablet TAKE 1 TABLET BY MOUTH 2 TIMES DAILY WITH A MEAL. PLEASE SCHEDULE APPOINTMENT FOR FUTURE REFILLS. Patient taking differently: Take 6.25 mg by mouth 2 (two) times daily with a meal. 12/08/20  Yes Donato Heinz, MD  cephALEXin (KEFLEX) 500 MG capsule Take 1 capsule (500 mg total) by mouth every 8 (eight) hours for 3 days. 02/10/21 02/13/21 Yes Donne Hazel, MD  cetirizine (ZYRTEC) 5 MG tablet Take 5 mg by mouth daily.   Yes [provider]  Dextromethorphan HBr (DELSYM PO) Take 15 mLs by mouth 2 (two) times daily as needed.   Yes [provider]  docusate sodium (COLACE) 100 MG capsule Take 1 capsule (100 mg total) by mouth daily. 02/10/21 03/12/21 Yes Donne Hazel, MD  dorzolamide-timolol (COSOPT) 22.3-6.8 MG/ML ophthalmic solution Place 1 drop into both eyes 2 (two) times  daily.   Yes [provider]  esomeprazole (NEXIUM) 20 MG packet Take 20 mg by mouth daily.   Yes [provider]  ezetimibe (ZETIA) 10 MG tablet Take 10 mg by mouth daily.    Yes [provider]  hydrochlorothiazide (HYDRODIURIL) 25 MG tablet Take 25 mg by mouth daily.   Yes [provider]  latanoprost (XALATAN) 0.005 % ophthalmic solution Place 1 drop into both eyes at bedtime.  12/14/17  Yes [provider]  predniSONE (DELTASONE) 10 MG tablet Take 6 tablets (60 mg total) by mouth daily for 3 days, THEN 4 tablets (40 mg total) daily for 3 days, THEN 2 tablets (20 mg total) daily for 3 days, THEN 1 tablet (10 mg total) daily for 3 days, THEN 0.5 tablets (5 mg total) daily for 2 days. 02/10/21 02/24/21 Yes Donne Hazel, MD  prochlorperazine (  COMPAZINE) 10 MG tablet Take 1 tablet (10 mg total) by mouth every 6 (six) hours as needed for nausea or vomiting. 12/27/20  Yes Curt Bears, MD  VITAMIN D-VITAMIN K PO Take 1 tablet by mouth daily.   Yes [provider]  benzonatate (TESSALON) 200 MG capsule Take 1 capsule (200 mg total) by mouth 3 (three) times daily as needed for cough. Patient not taking: Reported on 02/11/2021 02/10/21   Donne Hazel, MD  morphine 20 MG/5ML solution Take 0.6 mLs (2.4 mg total) by mouth every 4 (four) hours as needed (shortness of breath). 02/10/21   Donne Hazel, MD  sucralfate (CARAFATE) 1 g tablet Take 1 tablet (1 g total) by mouth 4 (four) times daily. with meals and at bedtime. Crush and dissolve in 10 mL of warm water prior to swallowing Patient not taking: Reported on 02/11/2021 02/11/21   Kyung Rudd, MD    Inpatient Medications: Scheduled Meds:  dorzolamide-timolol  1 drop Both Eyes BID   ezetimibe  10 mg Oral Daily   guaiFENesin-dextromethorphan  10 mL Oral QID   latanoprost  1 drop Both Eyes QHS   mometasone-formoterol  2 puff Inhalation BID   nitroGLYCERIN  0.5 inch Topical Q6H   pantoprazole  sodium  40 mg Oral Daily   [START ON 02/14/2021] predniSONE  60 mg Oral Q breakfast   Continuous Infusions:  heparin 1,000 Units/hr (02/13/21 1449)   PRN Meds: acetaminophen **OR** acetaminophen (TYLENOL) oral liquid 160 mg/5 mL **OR** acetaminophen, albuterol, HYDROcodone bit-homatropine, menthol-cetylpyridinium, senna-docusate  Allergies:    Allergies  Allergen Reactions   Penicillins Other (See Comments)    SYNCOPE PATIENT HAS HAD A PCN REACTION WITH IMMEDIATE RASH, FACIAL/TONGUE/THROAT SWELLING, SOB, OR LIGHTHEADEDNESS WITH HYPOTENSION:  #  #  YES  #  # Has patient had a PCN reaction causing severe rash involving mucus membranes or skin necrosis: No Has patient had a PCN reaction that required hospitalization: No Has patient had a PCN reaction occurring within the last 10 years: No    Vicodin [Hydrocodone-Acetaminophen] Other (See Comments)    Sped up heart and breathing   Other Other (See Comments)    Glaucoma eye drop    Social History:   Social History   Socioeconomic History   Marital status: Widowed    Spouse name: Not on file   Number of children: 3   Years of education: Not on file   Highest education level: Not on file  Occupational History    Employer: [employer]  Tobacco Use   Smoking status: Never   Smokeless tobacco: Never  Vaping Use   Vaping Use: Never used  Substance and Sexual Activity   Alcohol use: Not Currently    Comment: up to 3 drinks per week   Drug use: No    Comment: CBD oil    Sexual activity: Not Currently    Comment: Hysterectomy  Other Topics Concern   Not on file  Social History Narrative   Not on file   Social Determinants of Health   Financial Resource Strain: Not on file  Food Insecurity: Not on file  Transportation Needs: Not on file  Physical Activity: Not on file  Stress: Not on file  Social Connections: Not on file  Intimate Partner Violence: Not on file    Family History:    Family History  Problem Relation Age  of Onset   Heart disease Sister    Heart disease Brother    Lung  cancer Other      ROS:  Please see the history of present illness.  Constitutional: Negative for chills, fever, night sweats. Positive for fatigue HENT: Negative for ear pain and hearing loss.   Eyes: Negative for loss of vision and eye pain.  Respiratory: Positive for cough, sputum, wheezing.   Cardiovascular: See HPI. Gastrointestinal: Negative for abdominal pain, melena, and hematochezia.  Genitourinary: Negative for dysuria and hematuria.  Musculoskeletal: Negative for falls and myalgias.  Skin: Negative for itching and rash.  Neurological: see neurology note, recent focal changes Endo/Heme/Allergies: Does not bruise/bleed easily.   All other ROS reviewed and negative.     Physical Exam/Data:   Vitals:   02/13/21 1138 02/13/21 1230 02/13/21 1300 02/13/21 1400  BP: 138/84     Pulse: 73     Resp: (!) 22 14 17 16   Temp: 98.4 F (36.9 C)     TempSrc: Oral     SpO2: 100%       Intake/Output Summary (Last 24 hours) at 02/13/2021 1601 Last data filed at 02/13/2021 0800 Gross per 24 hour  Intake 1340.79 ml  Output 1350 ml  Net -9.21 ml   Last 3 Weights 02/07/2021 02/07/2021 02/02/2021  Weight (lbs) 127 lb 127 lb 1.6 oz 129 lb 1.6 oz  Weight (kg) 57.607 kg 57.652 kg 58.559 kg     There is no height or weight on file to calculate BMI.  General:  Frail appearing, conversationally dyspneic HEENT: normal Neck: no JVD Endocrine:  No thryomegaly Vascular: No carotid bruits; RA pulses 2+ bilaterally  Cardiac:  normal S1, S2; RRR; no murmur  Lungs:  clear to auscultation bilaterally, no wheezing, rhonchi or rales  Abd: soft, nontender, no hepatomegaly  Ext: no edema Musculoskeletal:  No deformities, moves all 4 limbs independently Skin: warm and dry  Neuro:  defer to neurology team  Psych:  Normal affect   EKG:  The EKG was personally reviewed and demonstrates:  NSR at 73 bpm without high risk findings or  ischemic changes. Telemetry:  Telemetry was personally reviewed and demonstrates:  NSR with occasional PVC  Relevant CV Studies: Echo 02/08/21 1. Left ventricular ejection fraction, by estimation, is 60 to 65%. The  left ventricle has normal function. The left ventricle has no regional  wall motion abnormalities. Left ventricular diastolic parameters were  normal.   2. Right ventricular systolic function is normal. The right ventricular  size is normal. There is normal pulmonary artery systolic pressure. The  estimated right ventricular systolic pressure is 62.1 mmHg.   3. The mitral valve is normal in structure. Mild to moderate mitral valve  regurgitation. No evidence of mitral stenosis.   4. Tricuspid valve regurgitation is mild to moderate.   5. The aortic valve is normal in structure. Aortic valve regurgitation is  not visualized. No aortic stenosis is present.   6. The inferior vena cava is normal in size with greater than 50%  respiratory variability, suggesting right atrial pressure of 3 mmHg.   Laboratory Data:  High Sensitivity Troponin:   Recent Labs  Lab 02/07/21 1838 02/07/21 2314 02/08/21 0154 02/13/21 1141 02/13/21 1425  TROPONINIHS 206* 184* 149* 1,008* 1,013*     Chemistry Recent Labs  Lab 02/11/21 1811 02/12/21 1040 02/13/21 0417  NA 139 137 138  K 2.8* 2.9* 4.7  CL 99 106 105  CO2 28 25 26   GLUCOSE 239* 102* 139*  BUN 19 12 10   CREATININE 1.02* 0.76 0.96  CALCIUM 9.5 8.0*  9.2  GFRNONAA 59* >60 >60  ANIONGAP 12 6 7     Recent Labs  Lab 02/11/21 1811 02/12/21 1040 02/13/21 0417  PROT 7.0 5.7* 6.3*  ALBUMIN 3.5 2.8* 3.0*  AST 39 29 36  ALT 43 31 46*  ALKPHOS 58 46 53  BILITOT 0.3 0.5 0.6   Hematology Recent Labs  Lab 02/11/21 1811 02/12/21 1040 02/13/21 0417  WBC 9.2 10.2 6.9  RBC 4.89 4.34 4.66  HGB 12.8 11.4* 12.1  HCT 39.8 35.5* 37.1  MCV 81.4 81.8 79.6*  MCH 26.2 26.3 26.0  MCHC 32.2 32.1 32.6  RDW 14.6 14.6 14.8  PLT 172  152 158   BNP Recent Labs  Lab 02/07/21 1640  BNP 35.4    DDimer No results for input(s): DDIMER in the last 168 hours.   Radiology/Studies:  CT ABDOMEN PELVIS WO CONTRAST  Result Date: 02/12/2021 CLINICAL DATA:  Abdominal pain. EXAM: CT ABDOMEN AND PELVIS WITHOUT CONTRAST TECHNIQUE: Multidetector CT imaging of the abdomen and pelvis was performed following the standard protocol without IV contrast. COMPARISON:  CT abdomen pelvis dated/CT dated 10/11/2020. FINDINGS: Evaluation of this exam is limited in the absence of intravenous contrast. Lower chest: There is consolidative changes of the majority of the visualized right lung base. A partially seen right-sided pleural effusion and diffusely thickened appearance of the right pleural, likely representing malignant effusion and pleural implants. Innumerable nodules within the left lung base may represent metastatic disease although an atypical infection is not excluded. These findings are new compared to the prior CT of 10/11/2020 most consistent with progression of metastatic disease. There is coronary vascular calcification. No intra-abdominal free air or free fluid. Hepatobiliary: No focal liver abnormality is seen. No gallstones, gallbladder wall thickening, or biliary dilatation. Pancreas: Unremarkable. No pancreatic ductal dilatation or surrounding inflammatory changes. Spleen: Normal in size without focal abnormality. Adrenals/Urinary Tract: Adrenal glands are unremarkable. Kidneys are normal, without renal calculi, focal lesion, or hydronephrosis. Bladder is unremarkable. Stomach/Bowel: Small scattered sigmoid diverticula without active inflammatory changes. There is moderate stool throughout the colon. There is no bowel obstruction or active inflammation. The appendix is not visualized with certainty. No inflammatory changes identified in the right lower quadrant. Vascular/Lymphatic: Mild aortoiliac atherosclerotic disease the IVC is  unremarkable. Portal venous gas. There is no adenopathy. Reproductive: Hysterectomy.  No adnexal masses. Other: None Musculoskeletal: Degenerative changes of the spine. There is a transitional vertebra. There is disc desiccation and vacuum phenomena at L5-S1. Lower lumbar facet arthropathy. No acute osseous pathology. IMPRESSION: 1. No acute intra-abdominal or pelvic pathology. No evidence of metastatic disease within the abdomen or pelvis. 2. Colonic diverticulosis. No bowel obstruction. 3. Progression of metastatic disease in the lung bases. 4. Aortic Atherosclerosis (ICD10-I70.0). Electronically Signed   By: Anner Crete M.D.   On: 02/12/2021 20:39   CT ANGIO HEAD W OR WO CONTRAST  Result Date: 02/13/2021 CLINICAL DATA:  Follow-up examination for acute stroke. EXAM: CT ANGIOGRAPHY HEAD AND NECK TECHNIQUE: Multidetector CT imaging of the head and neck was performed using the standard protocol during bolus administration of intravenous contrast. Multiplanar CT image reconstructions and MIPs were obtained to evaluate the vascular anatomy. Carotid stenosis measurements (when applicable) are obtained utilizing NASCET criteria, using the distal internal carotid diameter as the denominator. CONTRAST:  98mL OMNIPAQUE IOHEXOL 350 MG/ML SOLN COMPARISON:  Prior MRI from 02/11/2021 and CT from 02/12/2021. FINDINGS: CTA NECK FINDINGS Aortic arch: Visualized aortic arch normal in caliber with normal 3 vessel morphology. No stenosis seen  about the origin of the great vessels. Right carotid system: Right common and internal carotid arteries patent without stenosis, dissection or occlusion. Left carotid system: Left common and internal carotid arteries patent without stenosis, dissection, or occlusion. Mild eccentric calcified plaque about the left bifurcation without significant stenosis. Vertebral arteries: Both vertebral arteries arise from the subclavian arteries. Dominant right vertebral artery widely patent within  the neck without abnormality. Left vertebral artery patent proximally, but becomes increasingly attenuated as it courses cephalad, and occludes by the level of the skull base. This could reflect an underlying dissection. Skeleton: No visible acute osseous finding. No discrete or worrisome osseous lesions. Degenerative spondylosis noted at C4-5 through C6-7 without high-grade spinal stenosis. Degenerative changes noted about the left TMJ. Other neck: No other acute soft tissue abnormality within the neck. No mass or adenopathy. Few scattered right thyroid nodules noted, largest of which measures 11 mm. Findings of doubtful significance given size and patient age, no follow-up imaging recommended (ref: J Am Coll Radiol. 2015 Feb;12(2): 143-50). Upper chest: Previously identified spiculated mass at the right lung apex again seen, similar to previous. Circumferential pleuroparenchymal thickening about the right lung with probable loculated fluid along the right major fissure, partially visualized, and concerning for pleural involvement of disease. Diffusely irregular nodularity and interlobular septal thickening again seen, consistent with lymphangitic spread of tumor. Irregular nodularity and ground-glass opacity within the visualized left lung could be related to lymphangitic spread of tumor and/or infection. Review of the MIP images confirms the above findings CTA HEAD FINDINGS Anterior circulation: Petrous segments patent bilaterally. Mild atheromatous change within the carotid siphons without hemodynamically significant stenosis. Tiny 2 mm outpouching arising from the cavernous left ICA could reflect a small aneurysm versus vascular infundibulum (series 6, image 225). Left A1 segment widely patent. Right A1 hypoplastic and/or absent. Normal anterior communicating artery complex. Both ACAs patent to their distal aspects, with the left being dominant. No M1 stenosis or occlusion. Normal MCA bifurcations. Distal MCA  branches perfused and symmetric, with no visible proximal MCA branch occlusion. Posterior circulation: Right vertebral artery patent to the vertebrobasilar junction without stenosis. Right PICA patent. Left vertebral artery occluded at the skull base. Minimal retrograde filling into the distal left V4 segment across the vertebrobasilar junction noted (series 6, image 209). Left PICA not visualized. Basilar patent to its distal aspect without stenosis. Superior cerebral arteries patent bilaterally. Left PCA supplied via the basilar. Right PCA supplied via the basilar as well as a prominent right posterior communicating artery. PCAs patent to their distal aspects without stenosis. Venous sinuses: Grossly patent allowing for timing the contrast bolus. Anatomic variants: None significant. Review of the MIP images confirms the above findings IMPRESSION: 1. Increasing attenuation within the left vertebral artery as it courses cephalad within the neck, with occlusion at the skull base. Finding is age indeterminate, but could reflect changes of an underlying dissection. 2. Mild atheromatous change elsewhere about the major arterial vasculature of the head and neck. No other large vessel occlusion. No other proximal high-grade or correctable stenosis. 3. 2 mm outpouching arising from the cavernous left ICA, which could reflect a small aneurysm versus vascular infundibulum. 4. Spiculated right apical mass, consistent with known malignancy. Diffuse pleuroparenchymal thickening about the right lung with scattered interlobular septal thickening and nodularity within the remainder of the visualized lungs, concerning for lymphangitic spread of tumor. Superimposed infection could be considered in the correct clinical setting. Electronically Signed   By: Jeannine Boga M.D.   On:  02/13/2021 02:54   DG Chest 2 View  Result Date: 02/13/2021 CLINICAL DATA:  Shortness of breath for several weeks. History of lung cancer. EXAM:  CHEST - 2 VIEW COMPARISON:  Chest x-ray dated 02/07/2021. Chest CT dated 02/07/2021. FINDINGS: Heart size and mediastinal contours are stable. Dense opacity within the RIGHT lower lung is unchanged, corresponding to the mix of consolidations and pleural effusion better demonstrated on earlier CT. Additional mild interstitial prominence throughout the RIGHT upper lobe and LEFT lung is stable. Spiculated mass within the RIGHT lung apex is better demonstrated on earlier chest CT. IMPRESSION: 1. Stable chest x-ray. 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 2. Interstitial prominence bilaterally, suspicious for diffuse lymphangitic spread of malignancy. 3. Spiculated mass within the RIGHT lung apex is better demonstrated on earlier chest CT of 02/07/2021. Electronically Signed   By: Franki Cabot M.D.   On: 02/13/2021 15:25   CT HEAD WO CONTRAST (5MM)  Result Date: 02/12/2021 CLINICAL DATA:  Evaluate for possible bleed. EXAM: CT HEAD WITHOUT CONTRAST TECHNIQUE: Contiguous axial images were obtained from the base of the skull through the vertex without intravenous contrast. COMPARISON:  Head CT dated 02/11/2021. FINDINGS: Brain: Similar appearance of an ill-defined small hypodense focus in the right cerebellum. The ventricles and sulci are otherwise appropriate size for patient's age. There is no acute intracranial hemorrhage. No mass effect or midline shift no extra-axial fluid collection. Vascular: No hyperdense vessel or unexpected calcification. Skull: Normal. Negative for fracture or focal lesion. Sinuses/Orbits: No acute finding. Other: None IMPRESSION: Similar appearance of an ill-defined small hypodense focus in the right cerebellum. No acute intracranial hemorrhage.  Electronically Signed   By: Anner Crete M.D.   On: 02/12/2021 04:00   CT Head Wo Contrast  Result Date: 02/11/2021 CLINICAL DATA:  Sensation of everything in body being pulled to the left EXAM: CT HEAD WITHOUT CONTRAST TECHNIQUE: Contiguous axial images were obtained from the base of the skull through the vertex without intravenous contrast. COMPARISON:  MR brain dated 11/09/2020 FINDINGS: Brain: No evidence of acute infarction, hemorrhage, hydrocephalus, extra-axial collection or mass effect. However, there is a subtle possible 8 mm hypodense in the right cerebellum (series 2/image 9), not evident on prior MR, raising concern for small acute/subacute infarct or possibly a metastasis given the history of prior lung cancer. Vascular: No hyperdense vessel or unexpected calcification. Skull: Normal. Negative for fracture or focal lesion. Sinuses/Orbits: The visualized paranasal sinuses are essentially clear. The mastoid air cells are unopacified. Other: None. IMPRESSION: Possible 8 mm lesion in the right cerebellum, equivocal. This was not evident on prior MRI, raising concern for small acute/subacute infarct or possibly a metastasis. Consider MRI for further characterization. Electronically Signed   By: Julian Hy M.D.   On: 02/11/2021 19:11   CT ANGIO NECK W OR WO CONTRAST  Result Date: 02/13/2021 CLINICAL DATA:  Follow-up examination for acute stroke. EXAM: CT ANGIOGRAPHY HEAD AND NECK TECHNIQUE: Multidetector CT imaging of the head and neck was performed using the standard protocol during bolus administration of intravenous contrast. Multiplanar CT image reconstructions and MIPs were obtained to evaluate the vascular anatomy. Carotid stenosis measurements (when applicable) are obtained utilizing NASCET criteria, using the distal internal carotid diameter as the denominator. CONTRAST:  57mL OMNIPAQUE IOHEXOL 350 MG/ML SOLN COMPARISON:  Prior MRI  from 02/11/2021 and CT from 02/12/2021. FINDINGS:  CTA NECK FINDINGS Aortic arch: Visualized aortic arch normal in caliber with normal 3 vessel morphology. No stenosis seen about the origin of the great vessels. Right carotid system: Right common and internal carotid arteries patent without stenosis, dissection or occlusion. Left carotid system: Left common and internal carotid arteries patent without stenosis, dissection, or occlusion. Mild eccentric calcified plaque about the left bifurcation without significant stenosis. Vertebral arteries: Both vertebral arteries arise from the subclavian arteries. Dominant right vertebral artery widely patent within the neck without abnormality. Left vertebral artery patent proximally, but becomes increasingly attenuated as it courses cephalad, and occludes by the level of the skull base. This could reflect an underlying dissection. Skeleton: No visible acute osseous finding. No discrete or worrisome osseous lesions. Degenerative spondylosis noted at C4-5 through C6-7 without high-grade spinal stenosis. Degenerative changes noted about the left TMJ. Other neck: No other acute soft tissue abnormality within the neck. No mass or adenopathy. Few scattered right thyroid nodules noted, largest of which measures 11 mm. Findings of doubtful significance given size and patient age, no follow-up imaging recommended (ref: J Am Coll Radiol. 2015 Feb;12(2): 143-50). Upper chest: Previously identified spiculated mass at the right lung apex again seen, similar to previous. Circumferential pleuroparenchymal thickening about the right lung with probable loculated fluid along the right major fissure, partially visualized, and concerning for pleural involvement of disease. Diffusely irregular nodularity and interlobular septal thickening again seen, consistent with lymphangitic spread of tumor. Irregular nodularity and ground-glass opacity within the visualized left lung could be related to lymphangitic spread of tumor and/or infection. Review  of the MIP images confirms the above findings CTA HEAD FINDINGS Anterior circulation: Petrous segments patent bilaterally. Mild atheromatous change within the carotid siphons without hemodynamically significant stenosis. Tiny 2 mm outpouching arising from the cavernous left ICA could reflect a small aneurysm versus vascular infundibulum (series 6, image 225). Left A1 segment widely patent. Right A1 hypoplastic and/or absent. Normal anterior communicating artery complex. Both ACAs patent to their distal aspects, with the left being dominant. No M1 stenosis or occlusion. Normal MCA bifurcations. Distal MCA branches perfused and symmetric, with no visible proximal MCA branch occlusion. Posterior circulation: Right vertebral artery patent to the vertebrobasilar junction without stenosis. Right PICA patent. Left vertebral artery occluded at the skull base. Minimal retrograde filling into the distal left V4 segment across the vertebrobasilar junction noted (series 6, image 209). Left PICA not visualized. Basilar patent to its distal aspect without stenosis. Superior cerebral arteries patent bilaterally. Left PCA supplied via the basilar. Right PCA supplied via the basilar as well as a prominent right posterior communicating artery. PCAs patent to their distal aspects without stenosis. Venous sinuses: Grossly patent allowing for timing the contrast bolus. Anatomic variants: None significant. Review of the MIP images confirms the above findings IMPRESSION: 1. Increasing attenuation within the left vertebral artery as it courses cephalad within the neck, with occlusion at the skull base. Finding is age indeterminate, but could reflect changes of an underlying dissection. 2. Mild atheromatous change elsewhere about the major arterial vasculature of the head and neck. No other large vessel occlusion. No other proximal high-grade or correctable stenosis. 3. 2 mm outpouching arising from the cavernous left ICA, which could  reflect a small aneurysm versus vascular infundibulum. 4. Spiculated right apical mass, consistent with known malignancy. Diffuse pleuroparenchymal thickening about the right lung with scattered interlobular septal thickening and nodularity within the remainder of the visualized lungs, concerning  for lymphangitic spread of tumor. Superimposed infection could be considered in the correct clinical setting. Electronically Signed   By: Jeannine Boga M.D.   On: 02/13/2021 02:54   MR Brain W and Wo Contrast  Result Date: 02/11/2021 CLINICAL DATA:  Initial evaluation for neuro deficit, question CVA versus metastatic disease. History of stage IV lung cancer. EXAM: MRI HEAD WITHOUT AND WITH CONTRAST TECHNIQUE: Multiplanar, multiecho pulse sequences of the brain and surrounding structures were obtained without and with intravenous contrast. CONTRAST:  59mL GADAVIST GADOBUTROL 1 MMOL/ML IV SOLN COMPARISON:  Head CT from earlier the same day as well as previous MRI from 11/09/2020. FINDINGS: Brain: Cerebral volume within normal limits for age. Patchy T2/FLAIR signal abnormality seen involving the periventricular and deep white matter both cerebral hemispheres, nonspecific, but most likely related to chronic microvascular ischemic disease. Multiple scattered foci of diffusion abnormality are seen involving the cerebellum bilaterally, left slightly worse than right (series 5, images 56, 59). Additional patchy multifocal areas of diffusion abnormality seen involving the cortical and subcortical aspect of both cerebral hemispheres, most pronounced at the left occipital lobe (series 5, images 84, 82, 79, 69, 72). Foci involving the frontal parietal regions are somewhat watershed in distribution. Findings consistent with acute to subacute ischemic infarcts. Small focus of intrinsic T1 hyperintensity with susceptibility artifact present at the right cerebellum, consistent with petechial blood products (series 11, image 35  and series 9, image 14). No frank hemorrhagic transformation. No visible hemorrhage on prior CT. Additionally, scattered foci of patchy enhancement seen about several of these areas of ischemia, felt to be most consistent with probable evolving subacute ischemic change (series 15, images 123, 83, 75, 68). No other definite mass lesion or definite evidence for intracranial metastatic disease. No mass effect or midline shift. No hydrocephalus or extra-axial fluid collection. Empty sella noted. Midline structures intact. Vascular: Abnormal flow void within the left vertebral artery, which could be related to slow flow and/or occlusion (series 10, image 2). Enhancement is seen within the left vertebral artery following contrast administration. Major intracranial vascular flow voids are otherwise maintained. Skull and upper cervical spine: Craniocervical junction within normal limits. Bone marrow signal intensity normal. No focal marrow replacing lesion. No scalp soft tissue abnormality. Sinuses/Orbits: Globes and orbital soft tissues within normal limits. Paranasal sinuses are largely clear. No mastoid effusion. Inner ear structures grossly normal. Other: None. IMPRESSION: 1. Patchy multifocal acute to subacute ischemic infarcts involving the bilateral cerebral and cerebellar hemispheres. Associated small volume petechial blood products at the right cerebellum without frank hemorrhagic transformation. No significant mass effect. 2. Associated scattered areas of patchy enhancement as above, favored to be secondary to subacute ischemic changes. A short interval follow-up MRI to ensure these changes resolve is recommended, particularly given the patient history of stage IV lung cancer and to ensure no underlying metastatic disease is present. 3. Abnormal flow void within the left vertebral artery, which could be related to slow flow and/or occlusion. 4. Underlying chronic microvascular ischemic disease. Electronically  Signed   By: Jeannine Boga M.D.   On: 02/11/2021 23:11     Assessment and Plan:   Chest pain Elevated troponin Recent PE/DVT Current admission for CVA History of CAD with PCI 05/2019 -trend troponin -limited echo pending -given that her symptoms are more pleuritic, question if this is due to inflammation from either PE or pneumonitis -we discussed options. With recent stroke, I would recommend medical management unless troponins rise significantly, pain worsens/changes, or there are new  abnormalities on her echo -defer BP management to neurology given stroke -per her prior discharge summary, repatha was stopped. Unclear why. Given her CAD and statin intolerance, would prefer to continue if possible.  We will follow with you.   Risk Assessment/Risk Scores:     TIMI Risk Score for Unstable Angina or Non-ST Elevation MI:   The patient's TIMI risk score is 4, which indicates a 20% risk of all cause mortality, new or recurrent myocardial infarction or need for urgent revascularization in the next 14 days.          For questions or updates, please contact [place] Please consult [email or web address] for contact info under    Signed, Buford Dresser, MD  02/13/2021 4:01 PM

## 2021-02-13 NOTE — Consult Note (Signed)
Consultation Note Date: 02/13/2021   Patient Name: Allison [name]  DOB: Oct 08, 1950  MRN: [id number]  Age / Sex: 70 y.o., female  PCP: Allison Freshwater, NP Referring Physician: Lavina Hamman, MD  Reason for Consultation: Establishing goals of care and Psychosocial/spiritual support  HPI/Patient Profile: 70 y.o. female  admitted Allison 02/11/2021 with a past medical history of stage IV lung cancer s/p radiation therapy /Dr. Sondra [name], Dr. Mohammed/ oncologist/Allison [name].  She was only discharged from the hospital yesterday; she was treated for pulmonary embolism, postobstructive pneumonia, DVT.  Patient reports waking up with blurry vision and unsteady gait and dizziness.  She again was admitted through the emergency room for treatment and stabilization.  Head CT 02-12-21 IMPRESSION: 1. Increasing attenuation within the left vertebral artery as it courses cephalad within the neck, with occlusion at the skull base. Finding is age indeterminate, but could reflect changes of an underlying dissection. 2. Mild atheromatous change elsewhere about the major arterial vasculature of the head and neck. No other large vessel occlusion. No other proximal high-grade or correctable stenosis. 3. 2 mm outpouching arising from the cavernous left ICA, which could reflect a small aneurysm versus vascular infundibulum. 4. Spiculated right apical mass, consistent with known malignancy. Diffuse pleuroparenchymal thickening about the right lung with scattered interlobular septal thickening and nodularity within the remainder of the visualized lungs, concerning for lymphangitic spread of tumor. Superimposed infection could be considered in the correct clinical setting.  Patient and family face ongoing treatment option decisions, advanced directive decisions and anticipatory care needs.   Clinical Assessment and Goals of  Care:  This NP Allison [name] reviewed medical records, received report from team, assessed the patient and then meet at the patient's bedside  to discuss diagnosis, prognosis, GOC, EOL wishes disposition and options.  Her daughters Allison [name] and Allison [name] participated in conversation via telephone.   Concept of Palliative Care was introduced as specialized medical care for people and their families living with serious illness.  If focuses Allison providing relief from the symptoms and stress of a serious illness.  The goal is to improve quality of life for both the patient and the family.  Created space and opportunity for patient  and family to explore thoughts and feelings regarding current medical situation.   Patient is feeling overwhelmed; she feels that she has been doing well along her disease trajectory and treatments until last week, when things started "happening so fast".  She values her independence, she has a supportive family.  She expresses need for communication between her many providers, it is difficult navigating the many facets of a complex disease and treatment plan.  Will continue to discuss support options in the community, ie OP Community based palliative services.     A  discussion was had today regarding advanced directives.  Concepts specific to code status, artifical feeding and hydration, continued IV antibiotics and rehospitalization was had.  The difference between a aggressive medical intervention path  and a palliative comfort care path for this  patient at this time was had.  Values and goals of care important to patient and family were attempted to be elicited.   MOST form introduced.  Daughter plans to bring ACP documents in for scanning today.   Questions and concerns addressed.  Patient  encouraged to call with questions or concerns.     PMT will continue to support holistically.          Daughter Allison [name] is documented H POA according to  patient.    HCPOA    SUMMARY OF RECOMMENDATIONS    Code Status/Advance Care Planning: Full code Educated patient to consider DNR/DNI status understanding evidenced based poor outcomes in similar hospitalized patient, as the cause of arrest is likely associated with advanced chronic illness rather than an easily reversible acute cardio-pulmonary event.     Palliative Prophylaxis:  Aspiration, Bowel Regimen, Delirium Protocol, Frequent Pain Assessment, and Oral Care  Additional Recommendations (Limitations, Scope, Preferences): Full Scope Treatment  Psycho-social/Spiritual:  Desire for further Chaplaincy support: no declined   Prognosis:  Unable to determine  Discharge Planning: To Be Determined      Primary Diagnoses: Present Allison Admission:  CVA (cerebral vascular accident) ([place])  Adenocarcinoma of right lung, stage 4 ([place])  Hypertension  Pulmonary embolism (H[facility])  Leg DVT (deep venous thromboembolism), acute, bilateral ([place])   I have reviewed the medical record, interviewed the patient and family, and examined the patient. The following aspects are pertinent.  Past Medical History:  Diagnosis Date   Anxiety    Arthritis    Asthma    exercise induced   Depression    PMH   GERD (gastroesophageal reflux disease)    Glaucoma    History of radiation therapy 01/05/2021   IMRT right lung  11/24/2020-01/05/2021  Dr Allison [name]   Hypertension    lung ca dx'd 11/2017   right   Malignant pleural effusion    right   PONV (postoperative nausea and vomiting)    Pre-diabetes    Raynaud's disease    Raynaud's disease    Social History   Socioeconomic History   Marital status: Widowed    Spouse name: Not Allison file   Number of children: 3   Years of education: Not Allison file   Highest education level: Not Allison file  Occupational History    Employer: [employer]  Tobacco Use   Smoking status: Never   Smokeless tobacco: Never  Vaping Use   Vaping Use: Never used  Substance  and Sexual Activity   Alcohol use: Not Currently    Comment: up to 3 drinks per week   Drug use: No    Comment: CBD oil    Sexual activity: Not Currently    Comment: Hysterectomy  Other Topics Concern   Not Allison file  Social History Narrative   Not Allison file   Social Determinants of Health   Financial Resource Strain: Not Allison file  Food Insecurity: Not Allison file  Transportation Needs: Not Allison file  Physical Activity: Not Allison file  Stress: Not Allison file  Social Connections: Not Allison file   Family History  Problem Relation Age of Onset   Heart disease Sister    Heart disease Brother    Lung cancer Other    Scheduled Meds:  dorzolamide-timolol  1 drop Both Eyes BID   ezetimibe  10 mg Oral Daily   latanoprost  1 drop Both Eyes QHS   pantoprazole sodium  40 mg Oral Daily  predniSONE  60 mg Oral Q breakfast   Followed by   Derrill Memo Allison 02/14/2021] predniSONE  40 mg Oral Q breakfast   Followed by   Derrill Memo Allison 02/17/2021] predniSONE  20 mg Oral Q breakfast   Followed by   Derrill Memo Allison 02/20/2021] predniSONE  10 mg Oral Q breakfast   Followed by   Derrill Memo Allison 02/23/2021] predniSONE  5 mg Oral Q breakfast   Continuous Infusions:  heparin 850 Units/hr (02/13/21 0522)   lactated ringers 75 mL/hr at 02/12/21 2030   PRN Meds:.acetaminophen **OR** acetaminophen (TYLENOL) oral liquid 160 mg/5 mL **OR** acetaminophen, albuterol, senna-docusate Medications Prior to Admission:  Prior to Admission medications   Medication Sig Start Date End Date Taking? Authorizing Provider  acetaminophen (TYLENOL) 500 MG tablet Take 1,000 mg by mouth every 6 (six) hours as needed (pain).   Yes [provider]  albuterol (VENTOLIN HFA) 108 (90 Base) MCG/ACT inhaler Inhale 2 puffs into the lungs every 6 (six) hours as needed for wheezing or shortness of breath. 02/02/21  Yes Boscia, Greer Ee, NP  ALPRAZolam (XANAX) 0.25 MG tablet Take 0.25 mg by mouth 2 (two) times daily as needed for anxiety.    Yes [provider]  amLODipine (NORVASC) 5 MG tablet Take 5 mg by mouth daily. 11/23/20  Yes [provider]  APIXABAN Arne Cleveland) VTE STARTER PACK (10MG  AND 5MG ) Take as directed Allison package: start with two-5mg  tablets twice daily for 7 days. Allison day 8, switch to one-5mg  tablet twice daily. 02/10/21  Yes Donne Hazel, MD  aspirin 81 MG chewable tablet Chew 1 tablet (81 mg total) by mouth daily. 06/18/19  Yes Georgette Shell, MD  b complex vitamins capsule Take 1 capsule by mouth daily.   Yes [provider]  carvedilol (COREG) 6.25 MG tablet TAKE 1 TABLET BY MOUTH 2 TIMES DAILY WITH A MEAL. PLEASE SCHEDULE APPOINTMENT FOR FUTURE REFILLS. Patient taking differently: Take 6.25 mg by mouth 2 (two) times daily with a meal. 12/08/20  Yes Donato Heinz, MD  cephALEXin (KEFLEX) 500 MG capsule Take 1 capsule (500 mg total) by mouth every 8 (eight) hours for 3 days. 02/10/21 02/13/21 Yes Donne Hazel, MD  cetirizine (ZYRTEC) 5 MG tablet Take 5 mg by mouth daily.   Yes [provider]  Dextromethorphan HBr (DELSYM PO) Take 15 mLs by mouth 2 (two) times daily as needed.   Yes [provider]  docusate sodium (COLACE) 100 MG capsule Take 1 capsule (100 mg total) by mouth daily. 02/10/21 03/12/21 Yes Donne Hazel, MD  dorzolamide-timolol (COSOPT) 22.3-6.8 MG/ML ophthalmic solution Place 1 drop into both eyes 2 (two) times daily.   Yes [provider]  esomeprazole (NEXIUM) 20 MG packet Take 20 mg by mouth daily.   Yes [provider]  ezetimibe (ZETIA) 10 MG tablet Take 10 mg by mouth daily.    Yes [provider]  hydrochlorothiazide (HYDRODIURIL) 25 MG tablet Take 25 mg by mouth daily.   Yes [provider]  latanoprost (XALATAN) 0.005 % ophthalmic solution Place 1 drop into both eyes at bedtime.  12/14/17  Yes [provider]  predniSONE (DELTASONE) 10 MG tablet Take 6 tablets (60 mg total) by mouth daily for 3 days, THEN  4 tablets (40 mg total) daily for 3 days, THEN 2 tablets (20 mg total) daily for 3 days, THEN 1 tablet (10 mg total) daily for 3 days, THEN 0.5 tablets (5 mg total) daily for  2 days. 02/10/21 02/24/21 Yes Donne Hazel, MD  prochlorperazine (COMPAZINE) 10 MG tablet Take 1 tablet (10 mg total) by mouth every 6 (six) hours as needed for nausea or vomiting. 12/27/20  Yes Curt Bears, MD  VITAMIN D-VITAMIN K PO Take 1 tablet by mouth daily.   Yes [provider]  benzonatate (TESSALON) 200 MG capsule Take 1 capsule (200 mg total) by mouth 3 (three) times daily as needed for cough. Patient not taking: Reported Allison 02/11/2021 02/10/21   Donne Hazel, MD  morphine 20 MG/5ML solution Take 0.6 mLs (2.4 mg total) by mouth every 4 (four) hours as needed (shortness of breath). 02/10/21   Donne Hazel, MD  sucralfate (CARAFATE) 1 g tablet Take 1 tablet (1 g total) by mouth 4 (four) times daily. with meals and at bedtime. Crush and dissolve in 10 mL of warm water prior to swallowing Patient not taking: Reported Allison 02/11/2021 02/11/21   Kyung Rudd, MD   Allergies  Allergen Reactions   Penicillins Other (See Comments)    SYNCOPE PATIENT HAS HAD A PCN REACTION WITH IMMEDIATE RASH, FACIAL/TONGUE/THROAT SWELLING, SOB, OR LIGHTHEADEDNESS WITH HYPOTENSION:  #  #  YES  #  # Has patient had a PCN reaction causing severe rash involving mucus membranes or skin necrosis: No Has patient had a PCN reaction that required hospitalization: No Has patient had a PCN reaction occurring within the last 10 years: No    Vicodin [Hydrocodone-Acetaminophen] Other (See Comments)    Sped up heart and breathing   Other Other (See Comments)    Glaucoma eye drop   Review of Systems  Respiratory:  Positive for cough and shortness of breath.   Neurological:  Positive for weakness.   Physical Exam Constitutional:      Appearance: She is underweight.  Cardiovascular:     Rate and Rhythm: Normal rate.  Pulmonary:      Effort: Tachypnea present.  Skin:    General: Skin is warm and dry.  Neurological:     Mental Status: She is alert and oriented to person, place, and time.    Vital Signs: BP (!) 154/87 (BP Location: Left Arm)   Pulse 77   Temp 98.1 F (36.7 C) (Oral)   Resp 19   LMP  (LMP Unknown)   SpO2 98%  Pain Scale: 0-10   Pain Score: 0-No pain   SpO2: SpO2: 98 % O2 Device:SpO2: 98 % O2 Flow Rate: .   IO: Intake/output summary:  Intake/Output Summary (Last 24 hours) at 02/13/2021 2778 Last data filed at 02/13/2021 0319 Gross per 24 hour  Intake 914.29 ml  Output 1350 ml  Net -435.71 ml    LBM: Last BM Date: 02/12/21 Baseline Weight:   Most recent weight:       Palliative Assessment/Data:  60 % today   Discussed with Dr Posey Pronto  Time In: 1000 Time Out: 1115 Time Total: 75 minutes Greater than 50%  of this time was spent counseling and coordinating care related to the above assessment and plan.  Signed by: Allison Lessen, NP   Please contact Palliative Medicine Team phone at [phone] for questions and concerns.  For individual provider: See [name]

## 2021-02-13 NOTE — Progress Notes (Signed)
ANTICOAGULATION CONSULT NOTE - Follow Up Consult  Pharmacy Consult for Heparin Indication:   recent pulmonary embolus on apixaban, new embolic stroke  Allergies  Allergen Reactions   Penicillins Other (See Comments)    SYNCOPE PATIENT HAS HAD A PCN REACTION WITH IMMEDIATE RASH, FACIAL/TONGUE/THROAT SWELLING, SOB, OR LIGHTHEADEDNESS WITH HYPOTENSION:  #  #  YES  #  # Has patient had a PCN reaction causing severe rash involving mucus membranes or skin necrosis: No Has patient had a PCN reaction that required hospitalization: No Has patient had a PCN reaction occurring within the last 10 years: No    Vicodin [Hydrocodone-Acetaminophen] Other (See Comments)    Sped up heart and breathing   Other Other (See Comments)    Glaucoma eye drop    Patient Measurements:   Heparin Dosing Weight: 57.6 kg  Vital Signs: Temp: 98.4 F (36.9 C) (08/28 1138) Temp Source: Oral (08/28 1138) BP: 138/84 (08/28 1138) Pulse Rate: 73 (08/28 1138)  Labs: Recent Labs    02/11/21 1811 02/12/21 1040 02/13/21 0417 02/13/21 1141  HGB 12.8 11.4* 12.1  --   HCT 39.8 35.5* 37.1  --   PLT 172 152 158  --   APTT  --   --  44* 48*  HEPARINUNFRC  --   --  >1.10* >1.10*  CREATININE 1.02* 0.76 0.96  --   TROPONINIHS  --   --   --  1,008*    Estimated Creatinine Clearance: 47.1 mL/min (by C-G formula based on SCr of 0.96 mg/dL).   Medications:  - PTA Apixaban 10 mg bid x 7 > 5 mg bid on day 5 of loading phase  Scheduled:   dorzolamide-timolol  1 drop Both Eyes BID   ezetimibe  10 mg Oral Daily   guaiFENesin-dextromethorphan  10 mL Oral QID   latanoprost  1 drop Both Eyes QHS   mometasone-formoterol  2 puff Inhalation BID   nitroGLYCERIN  0.5 inch Topical Q6H   pantoprazole sodium  40 mg Oral Daily   [START ON 02/14/2021] predniSONE  60 mg Oral Q breakfast   Infusions:   heparin 850 Units/hr (02/13/21 0800)    Assessment: 70 y/o F with a h/o lung cancer discharged on 8/25 with Eliquis for PE.  Patient was admitted with neurologic symptoms and found to have multifocal acute and subacute ischemic infarcts. Pharmacy consulted to dose and monitor transition to heparin infusion.    Heparin started at 700 units/hr and increased to 850 units/hr @ 0500. Repeat aPTT @ 1300 continues to be subtherapeutic at 48 seconds. Heparin levels remain > 1.1 due to recent Apixaban. Will increase by ~20%, to 1000 units/hr and recheck heparin level in 8 hours.   Goal of Therapy:  Heparin level 0.3-0.5 units/ml aPTT 66-85 seconds Monitor platelets by anticoagulation protocol: Yes   Plan:  - Increase heparin to 1000 units/hr - Check aPTT level in 8 hours - Continue daily heparin and aPTT levels - Continue to monitor H&H and platelets   Thank you for allowing pharmacy to be a part of this patient's care.  Ardyth Harps, PharmD Clinical Pharmacist

## 2021-02-13 NOTE — Evaluation (Signed)
Physical Therapy Evaluation Patient Details Name: Allison [name] MRN: [id number] DOB: 1951/01/27 Today's Date: 02/13/2021   History of Present Illness  Patient is a 70 year old female presenting to ED with blurry vision gait deviations, listing to L. MRI reveal Patchy multifocal acute to subacute ischemic infarcts involving  the bilateral cerebral and cerebellar hemispheres. Patient recently D/C from hospital after treatment for PNA, acute PT and DVT. PMH includes lung CA, CAD, MI, glaucoma  Clinical Impression  PTA pt living alone in single story home with 4 steps to enter. Pt reports independence, working out 5 days a week. Pt is currently limited is safe mobility by decreased strength, balance and endurance. Pt is currently independent in bed mobility and supervision for transfers, ambulation and stairs. Pt is very driven and PT provided education on energy conservation and gradual return to exercise. PT recommending Outpatient PT if pt would like to pursue for guidance on return to PLOF. PT will continue to follow acutely.     Follow Up Recommendations Outpatient PT (for generalized strength, and balance training)    Equipment Recommendations  None recommended by PT       Precautions / Restrictions Precautions Precautions: Fall Restrictions Weight Bearing Restrictions: No      Mobility  Bed Mobility Overal bed mobility: Independent                  Transfers Overall transfer level: Needs assistance Equipment used: None Transfers: Sit to/from Stand Sit to Stand: Supervision         General transfer comment: for safety  Ambulation/Gait Ambulation/Gait assistance: Supervision Gait Distance (Feet): 150 Feet Assistive device: None Gait Pattern/deviations: Step-through pattern;Decreased step length - left;Decreased step length - right;Decreased stride length Gait velocity: slower than her normal Gait velocity interpretation: >2.62 ft/sec, indicative of community  ambulatory General Gait Details: continues to have 2-3/4 DoE, strong, steady gait  Stairs Stairs: Yes Stairs assistance: Supervision Stair Management: Two rails;Alternating pattern;Forwards Number of Stairs: 10 General stair comments: strong, steady ascent/descent requires standing rest break for 4/4 DoE   Modified Rankin (Stroke Patients Only) Modified Rankin (Stroke Patients Only) Pre-Morbid Rankin Score: No symptoms Modified Rankin: No significant disability     Balance Overall balance assessment: Needs assistance Sitting-balance support: Feet supported Sitting balance-Leahy Scale: Good     Standing balance support: No upper extremity supported Standing balance-Leahy Scale: Fair Standing balance comment: static stand without difficulty, with ambulation min G for safety and gait speed improved with walker                             Pertinent Vitals/Pain Pain Assessment: No/denies pain Faces Pain Scale: Hurts little more Pain Location: face/nose Pain Descriptors / Indicators: Sore    Home Living Family/patient expects to be discharged to:: Private residence Living Arrangements: Alone Available Help at Discharge: Available PRN/intermittently Type of Home: House Home Access: Stairs to enter Entrance Stairs-Rails: Psychiatric nurse of Steps: 4 front Home Layout: One level Home Equipment: Shower seat - built in      Prior Function Level of Independence: Independent               Hand Dominance   Dominant Hand: Right    Extremity/Trunk Assessment   Upper Extremity Assessment Upper Extremity Assessment: Defer to OT evaluation    Lower Extremity Assessment Lower Extremity Assessment: Overall WFL for tasks assessed    Cervical / Trunk Assessment Cervical / Trunk  Assessment: Normal  Communication   Communication: No difficulties  Cognition Arousal/Alertness: Awake/alert Behavior During Therapy: WFL for tasks  assessed/performed Overall Cognitive Status: Within Functional Limits for tasks assessed                                        General Comments General comments (skin integrity, edema, etc.): at rest HR 76bpm, with stair training max noted HR 118bpm, SAO2 100%O2 after ambulation, extensive education about energy conservation    Exercises Other Exercises Other Exercises: IS x 5 max inhalation 1170mL Other Exercises: fluttervalve x 10   Assessment/Plan    PT Assessment Patient needs continued PT services  PT Problem List Decreased strength;Decreased balance;Decreased activity tolerance;Cardiopulmonary status limiting activity       PT Treatment Interventions Gait training;Stair training;Functional mobility training;Therapeutic activities;Therapeutic exercise;Balance training;Cognitive remediation;Patient/family education    PT Goals (Current goals can be found in the Care Plan section)  Acute Rehab PT Goals Patient Stated Goal: home PT Goal Formulation: With patient Time For Goal Achievement: 02/27/21 Potential to Achieve Goals: Good    Frequency Min 4X/week    AM-PAC PT "6 Clicks" Mobility  Outcome Measure Help needed turning from your back to your side while in a flat bed without using bedrails?: None Help needed moving from lying on your back to sitting on the side of a flat bed without using bedrails?: None Help needed moving to and from a bed to a chair (including a wheelchair)?: None Help needed standing up from a chair using your arms (e.g., wheelchair or bedside chair)?: None Help needed to walk in hospital room?: None Help needed climbing 3-5 steps with a railing? : None 6 Click Score: 24    End of Session Equipment Utilized During Treatment: Gait belt Activity Tolerance: Patient tolerated treatment well Patient left: in chair;with call bell/phone within reach;with chair alarm set Nurse Communication: Mobility status PT Visit Diagnosis:  Difficulty in walking, not elsewhere classified (R26.2);Unsteadiness on feet (R26.81)    Time: 1331-1410 PT Time Calculation (min) (ACUTE ONLY): 39 min   Charges:   PT Evaluation $PT Eval Moderate Complexity: 1 Mod PT Treatments $Gait Training: 8-22 mins $Therapeutic Exercise: 8-22 mins        Macrae Wiegman B. Migdalia Dk PT, DPT Acute Rehabilitation Services Pager [phone] Office [phone]   Central 02/13/2021, 2:32 PM

## 2021-02-13 NOTE — Progress Notes (Signed)
STROKE TEAM PROGRESS NOTE   INTERVAL HISTORY Patient very pleasant, on the phone with daughters who listened in on the conversation, answered all questions.  Vitals:   02/12/21 2035 02/13/21 0005 02/13/21 0319 02/13/21 0824  BP: (!) 175/83 (!) 141/83 (!) 154/87 (!) 154/109  Pulse: 88 74 77 91  Resp: 20 19 19  (!) 21  Temp: 98.4 F (36.9 C) 98.4 F (36.9 C) 98.1 F (36.7 C) 98.5 F (36.9 C)  TempSrc: Oral Oral Oral Oral  SpO2: 99% 97% 98% 99%   CBC:  Recent Labs  Lab 02/12/21 1040 02/13/21 0417  WBC 10.2 6.9  NEUTROABS 8.9* 6.2  HGB 11.4* 12.1  HCT 35.5* 37.1  MCV 81.8 79.6*  PLT 152 408   Basic Metabolic Panel:  Recent Labs  Lab 02/12/21 1040 02/13/21 0417  NA 137 138  K 2.9* 4.7  CL 106 105  CO2 25 26  GLUCOSE 102* 139*  BUN 12 10  CREATININE 0.76 0.96  CALCIUM 8.0* 9.2  MG 1.9 2.3  PHOS 1.7* 2.7   Lipid Panel:  Recent Labs  Lab 02/12/21 0559  CHOL 175  TRIG 110  HDL 62  CHOLHDL 2.8  VLDL 22  LDLCALC 91   HgbA1c:  Recent Labs  Lab 02/12/21 0559  HGBA1C 5.7*     IMAGING past 24 hours CT ABDOMEN PELVIS WO CONTRAST  Result Date: 02/12/2021 CLINICAL DATA:  Abdominal pain. EXAM: CT ABDOMEN AND PELVIS WITHOUT CONTRAST TECHNIQUE: Multidetector CT imaging of the abdomen and pelvis was performed following the standard protocol without IV contrast. COMPARISON:  CT abdomen pelvis dated/CT dated 10/11/2020. FINDINGS: Evaluation of this exam is limited in the absence of intravenous contrast. Lower chest: There is consolidative changes of the majority of the visualized right lung base. A partially seen right-sided pleural effusion and diffusely thickened appearance of the right pleural, likely representing malignant effusion and pleural implants. Innumerable nodules within the left lung base may represent metastatic disease although an atypical infection is not excluded. These findings are new compared to the prior CT of 10/11/2020 most consistent with  progression of metastatic disease. There is coronary vascular calcification. No intra-abdominal free air or free fluid. Hepatobiliary: No focal liver abnormality is seen. No gallstones, gallbladder wall thickening, or biliary dilatation. Pancreas: Unremarkable. No pancreatic ductal dilatation or surrounding inflammatory changes. Spleen: Normal in size without focal abnormality. Adrenals/Urinary Tract: Adrenal glands are unremarkable. Kidneys are normal, without renal calculi, focal lesion, or hydronephrosis. Bladder is unremarkable. Stomach/Bowel: Small scattered sigmoid diverticula without active inflammatory changes. There is moderate stool throughout the colon. There is no bowel obstruction or active inflammation. The appendix is not visualized with certainty. No inflammatory changes identified in the right lower quadrant. Vascular/Lymphatic: Mild aortoiliac atherosclerotic disease the IVC is unremarkable. Portal venous gas. There is no adenopathy. Reproductive: Hysterectomy.  No adnexal masses. Other: None Musculoskeletal: Degenerative changes of the spine. There is a transitional vertebra. There is disc desiccation and vacuum phenomena at L5-S1. Lower lumbar facet arthropathy. No acute osseous pathology. IMPRESSION: 1. No acute intra-abdominal or pelvic pathology. No evidence of metastatic disease within the abdomen or pelvis. 2. Colonic diverticulosis. No bowel obstruction. 3. Progression of metastatic disease in the lung bases. 4. Aortic Atherosclerosis (ICD10-I70.0). Electronically Signed   By: Anner Crete M.D.   On: 02/12/2021 20:39   CT ANGIO HEAD W OR WO CONTRAST  Result Date: 02/13/2021 CLINICAL DATA:  Follow-up examination for acute stroke. EXAM: CT ANGIOGRAPHY HEAD AND NECK TECHNIQUE: Multidetector CT imaging  of the head and neck was performed using the standard protocol during bolus administration of intravenous contrast. Multiplanar CT image reconstructions and MIPs were obtained to evaluate  the vascular anatomy. Carotid stenosis measurements (when applicable) are obtained utilizing NASCET criteria, using the distal internal carotid diameter as the denominator. CONTRAST:  32mL OMNIPAQUE IOHEXOL 350 MG/ML SOLN COMPARISON:  Prior MRI from 02/11/2021 and CT from 02/12/2021. FINDINGS: CTA NECK FINDINGS Aortic arch: Visualized aortic arch normal in caliber with normal 3 vessel morphology. No stenosis seen about the origin of the great vessels. Right carotid system: Right common and internal carotid arteries patent without stenosis, dissection or occlusion. Left carotid system: Left common and internal carotid arteries patent without stenosis, dissection, or occlusion. Mild eccentric calcified plaque about the left bifurcation without significant stenosis. Vertebral arteries: Both vertebral arteries arise from the subclavian arteries. Dominant right vertebral artery widely patent within the neck without abnormality. Left vertebral artery patent proximally, but becomes increasingly attenuated as it courses cephalad, and occludes by the level of the skull base. This could reflect an underlying dissection. Skeleton: No visible acute osseous finding. No discrete or worrisome osseous lesions. Degenerative spondylosis noted at C4-5 through C6-7 without high-grade spinal stenosis. Degenerative changes noted about the left TMJ. Other neck: No other acute soft tissue abnormality within the neck. No mass or adenopathy. Few scattered right thyroid nodules noted, largest of which measures 11 mm. Findings of doubtful significance given size and patient age, no follow-up imaging recommended (ref: J Am Coll Radiol. 2015 Feb;12(2): 143-50). Upper chest: Previously identified spiculated mass at the right lung apex again seen, similar to previous. Circumferential pleuroparenchymal thickening about the right lung with probable loculated fluid along the right major fissure, partially visualized, and concerning for pleural  involvement of disease. Diffusely irregular nodularity and interlobular septal thickening again seen, consistent with lymphangitic spread of tumor. Irregular nodularity and ground-glass opacity within the visualized left lung could be related to lymphangitic spread of tumor and/or infection. Review of the MIP images confirms the above findings CTA HEAD FINDINGS Anterior circulation: Petrous segments patent bilaterally. Mild atheromatous change within the carotid siphons without hemodynamically significant stenosis. Tiny 2 mm outpouching arising from the cavernous left ICA could reflect a small aneurysm versus vascular infundibulum (series 6, image 225). Left A1 segment widely patent. Right A1 hypoplastic and/or absent. Normal anterior communicating artery complex. Both ACAs patent to their distal aspects, with the left being dominant. No M1 stenosis or occlusion. Normal MCA bifurcations. Distal MCA branches perfused and symmetric, with no visible proximal MCA branch occlusion. Posterior circulation: Right vertebral artery patent to the vertebrobasilar junction without stenosis. Right PICA patent. Left vertebral artery occluded at the skull base. Minimal retrograde filling into the distal left V4 segment across the vertebrobasilar junction noted (series 6, image 209). Left PICA not visualized. Basilar patent to its distal aspect without stenosis. Superior cerebral arteries patent bilaterally. Left PCA supplied via the basilar. Right PCA supplied via the basilar as well as a prominent right posterior communicating artery. PCAs patent to their distal aspects without stenosis. Venous sinuses: Grossly patent allowing for timing the contrast bolus. Anatomic variants: None significant. Review of the MIP images confirms the above findings IMPRESSION: 1. Increasing attenuation within the left vertebral artery as it courses cephalad within the neck, with occlusion at the skull base. Finding is age indeterminate, but could  reflect changes of an underlying dissection. 2. Mild atheromatous change elsewhere about the major arterial vasculature of the head and neck. No  other large vessel occlusion. No other proximal high-grade or correctable stenosis. 3. 2 mm outpouching arising from the cavernous left ICA, which could reflect a small aneurysm versus vascular infundibulum. 4. Spiculated right apical mass, consistent with known malignancy. Diffuse pleuroparenchymal thickening about the right lung with scattered interlobular septal thickening and nodularity within the remainder of the visualized lungs, concerning for lymphangitic spread of tumor. Superimposed infection could be considered in the correct clinical setting. Electronically Signed   By: Jeannine Boga M.D.   On: 02/13/2021 02:54   CT ANGIO NECK W OR WO CONTRAST  Result Date: 02/13/2021 CLINICAL DATA:  Follow-up examination for acute stroke. EXAM: CT ANGIOGRAPHY HEAD AND NECK TECHNIQUE: Multidetector CT imaging of the head and neck was performed using the standard protocol during bolus administration of intravenous contrast. Multiplanar CT image reconstructions and MIPs were obtained to evaluate the vascular anatomy. Carotid stenosis measurements (when applicable) are obtained utilizing NASCET criteria, using the distal internal carotid diameter as the denominator. CONTRAST:  94mL OMNIPAQUE IOHEXOL 350 MG/ML SOLN COMPARISON:  Prior MRI from 02/11/2021 and CT from 02/12/2021. FINDINGS: CTA NECK FINDINGS Aortic arch: Visualized aortic arch normal in caliber with normal 3 vessel morphology. No stenosis seen about the origin of the great vessels. Right carotid system: Right common and internal carotid arteries patent without stenosis, dissection or occlusion. Left carotid system: Left common and internal carotid arteries patent without stenosis, dissection, or occlusion. Mild eccentric calcified plaque about the left bifurcation without significant stenosis. Vertebral  arteries: Both vertebral arteries arise from the subclavian arteries. Dominant right vertebral artery widely patent within the neck without abnormality. Left vertebral artery patent proximally, but becomes increasingly attenuated as it courses cephalad, and occludes by the level of the skull base. This could reflect an underlying dissection. Skeleton: No visible acute osseous finding. No discrete or worrisome osseous lesions. Degenerative spondylosis noted at C4-5 through C6-7 without high-grade spinal stenosis. Degenerative changes noted about the left TMJ. Other neck: No other acute soft tissue abnormality within the neck. No mass or adenopathy. Few scattered right thyroid nodules noted, largest of which measures 11 mm. Findings of doubtful significance given size and patient age, no follow-up imaging recommended (ref: J Am Coll Radiol. 2015 Feb;12(2): 143-50). Upper chest: Previously identified spiculated mass at the right lung apex again seen, similar to previous. Circumferential pleuroparenchymal thickening about the right lung with probable loculated fluid along the right major fissure, partially visualized, and concerning for pleural involvement of disease. Diffusely irregular nodularity and interlobular septal thickening again seen, consistent with lymphangitic spread of tumor. Irregular nodularity and ground-glass opacity within the visualized left lung could be related to lymphangitic spread of tumor and/or infection. Review of the MIP images confirms the above findings CTA HEAD FINDINGS Anterior circulation: Petrous segments patent bilaterally. Mild atheromatous change within the carotid siphons without hemodynamically significant stenosis. Tiny 2 mm outpouching arising from the cavernous left ICA could reflect a small aneurysm versus vascular infundibulum (series 6, image 225). Left A1 segment widely patent. Right A1 hypoplastic and/or absent. Normal anterior communicating artery complex. Both ACAs  patent to their distal aspects, with the left being dominant. No M1 stenosis or occlusion. Normal MCA bifurcations. Distal MCA branches perfused and symmetric, with no visible proximal MCA branch occlusion. Posterior circulation: Right vertebral artery patent to the vertebrobasilar junction without stenosis. Right PICA patent. Left vertebral artery occluded at the skull base. Minimal retrograde filling into the distal left V4 segment across the vertebrobasilar junction noted (series 6, image  209). Left PICA not visualized. Basilar patent to its distal aspect without stenosis. Superior cerebral arteries patent bilaterally. Left PCA supplied via the basilar. Right PCA supplied via the basilar as well as a prominent right posterior communicating artery. PCAs patent to their distal aspects without stenosis. Venous sinuses: Grossly patent allowing for timing the contrast bolus. Anatomic variants: None significant. Review of the MIP images confirms the above findings IMPRESSION: 1. Increasing attenuation within the left vertebral artery as it courses cephalad within the neck, with occlusion at the skull base. Finding is age indeterminate, but could reflect changes of an underlying dissection. 2. Mild atheromatous change elsewhere about the major arterial vasculature of the head and neck. No other large vessel occlusion. No other proximal high-grade or correctable stenosis. 3. 2 mm outpouching arising from the cavernous left ICA, which could reflect a small aneurysm versus vascular infundibulum. 4. Spiculated right apical mass, consistent with known malignancy. Diffuse pleuroparenchymal thickening about the right lung with scattered interlobular septal thickening and nodularity within the remainder of the visualized lungs, concerning for lymphangitic spread of tumor. Superimposed infection could be considered in the correct clinical setting. Electronically Signed   By: Jeannine Boga M.D.   On: 02/13/2021 02:54     PHYSICAL EXAM Exam: NAD, pleasant                  Speech:    Speech is normal; fluent and spontaneous with normal comprehension.  Cognition:    The patient is oriented to person, place, and time;     recent and remote memory intact;     language fluent;    Cranial Nerves:    The pupils are equal, round, and reactive to light.Trigeminal sensation is intact and the muscles of mastication are normal. The face is symmetric. The palate elevates in the midline. Hearing intact. Voice is normal. Shoulder shrug is normal. The tongue has normal motion without fasciculations.   Coordination:  No dysmetria  Motor Observation:    No asymmetry, no atrophy, and no involuntary movements noted. Tone:    Normal muscle tone.     Strength:possible left UE mild weakness otherwise strength is intact in the upper and lower limbs.      Sensation: intact to LT    ASSESSMENT/PLAN Allison [name] is a 70 y.o. female  with a past medical history significant for stage IV adenocarcinoma of the right lung (undergoing chemotherapy and radiation), hypercoagulable state secondary to malignancy (recent DVT and PE on Eliquis), hypertension, prediabetes, anxiety/depression   She was recently admitted 8/22-25 for fever, cough, and shortness of breath, found to have post obstructive pneumonia, pulmonary embolus, and DVT.  She was continued on a steroid taper as an outpatient .  Regarding her PEs these were found to be small clot burden on CT without evidence of hemodynamic compromise or right ventricular strain and she was started on apixaban at discharge.  She was additionally continued on Keflex for pneumonia.    Unfortunately the morning after her discharge when she awoke she noted she had some weakness on the left side and Leaning towards the left with some double vision.  She presented to the ED for evaluation, and was found to have strokes on MRI.  Possibly discussed with telemetry specialists overnight  though there is no neurology note.  Today primary team reached out for in person neurology evaluation    Stroke:  bilateral cerebral and cerebellar infarcts embolic likely due to hypercoagulable state in stage IV  lung cancer Code Stroke CT head Similar appearance of an ill-defined small hypodense focus in the right cerebellum.  CTA head /neck: 1. Increasing attenuation within the left vertebral artery as it courses cephalad within the neck, with occlusion at the skull base. Finding is age indeterminate, but could reflect changes of an underlying dissection. Mild atheromatous change elsewhere about the major arterial vasculature of the head and neck. No other large vessel occlusion.No other proximal high-grade or correctable stenosis.Marland Kitchen 2 mm  outpouching arising from the cavernous left ICA, which could reflect a small aneurysm versus vascular infundibulum. MRI  8/26 Patchy multifocal acute to subacute ischemic infarcts involving the bilateral cerebral and cerebellar hemispheres. Associated small volume petechial blood products at the right cerebellum without frank hemorrhagic transformation. Recommend repeating MRI in 3-4 weeks to rule out metastasis but that is less likely 2D Echo pending LDL 91 HgbA1c 5.7 VTE prophylaxis - Eliquis    Diet   Diet Heart Room service appropriate? Yes; Fluid consistency: Thin   Eliquis (apixaban) daily prior to admission, now on Eliquis (apixaban) daily. Continue anticoagulation per oncology recommendations. Therapy recommendations:  outpt PT/OT Disposition:  outpatient PT/OT  Hypertension Home meds:  hydrochlorothiazide 25mg  , Coreg, amlodipine Stable Permissive hypertension (OK if < 220/120) but gradually normalize in 5-7 days Long-term BP goal normotensive  Hyperlipidemia Home meds:  nonw,  LDL 91, goal < 70 Add atorvastatin 40 mg Continue statin at discharge  Diabetes type II Controlled (no diagnosis) Home meds:  none HgbA1c 5.7, goal < 7.0  Other  Stroke Risk Factors Advanced Age >/= 32   Other Active Problems Stage IV Lung cancer PE- on eliquis  Hospital day # 1  Laurey Morale, MSN, NP-C [facility] [phone]  Bilateral cerebral and cerebellar embolic infarcts likely due to hypercoagulable state in stage IV lung cancer. Continue Eliquis per oncology recommendations. Stroke will sign off. Discussed with attending.   Personally examined patient and images, and have participated in and made any corrections needed to history, physical, neuro exam,assessment and plan as stated above.  I have personally obtained the history, evaluated lab date, reviewed imaging studies and agree with radiology interpretations.    Sarina Ill, MD Stroke Neurology  I spent 35 minutes of face-to-face and non-face-to-face time with patient. This included prechart review, lab review, study review, order entry, electronic health record documentation, patient education on the different diagnostic and therapeutic options, counseling and coordination of care, risks and benefits of management, compliance, or risk factor reduction  To contact Stroke Continuity provider, please refer to [email or web address] After hours, contact General Neurology

## 2021-02-13 NOTE — Progress Notes (Signed)
Patient troponin = 1008 reported to me at 1405 from [facility].  MD notified at  1415

## 2021-02-13 NOTE — Progress Notes (Signed)
[facility] Progress Note  Patient: Allison [name]    JYN:[id number]  DOA: 02/11/2021     Date of Service: the patient was seen and examined on 02/13/2021  Brief hospital course: Past medical history of HTN, adenocarcinoma of right lung, history of chemotherapy and radiation, recent PE DVT, CAD.  Presents with complaints of dizziness and abnormal gait.  Found to have acute CVA.  Initially admitted requested hospital.  Transfer to [facility] for further evaluation. Neurology consulted. Had some chest pain and therefore cardiology was consulted due to positive troponins. Currently plan is complete stroke work-up and monitor for stability from cardiovascular perspective follow-up on PT OT recommendation.  Subjective: Reports chest pain.  No nausea no vomiting.  No fever no chills.  No diarrhea.  No bleeding.  Reports she is compliant with anticoagulation.  No dizziness at the time of my evaluation.  No other focal deficit.  Has a dry cough without any sputum production.  Intermittently patient is able to speak in full sentences.  Assessment and Plan: 1.  Bilateral cerebral and cerebellar infarcts likely embolic in nature in the setting of hypercoagulable state due to stage IV lung cancer. CT head read cellular hyperdensity. CTA questions occlusion of left vertebral artery at skull base MRI shows patchy multifocal acute to subacute and ischemic infarct in bilateral cerebral and cerebellar hemisphere. Neurology consulted. Currently on IV heparin. Patient was on Eliquis prior to admission.  Will discuss with hematology regarding anticoagulation recommendation on on discharge. Repeat MRI in 3 to 4 weeks recommended to rule out metastasis although appearance is more likely consistent with stroke.  PT recommends home therapy.  2.  Chest pain History of CAD Elevated troponin Patient reports ongoing chest pain which appears to be more associated with her malignancy and radiation  history. EKG unremarkable for any acute ischemic events. Troponins were elevated although delta troponin not significantly elevated. Patient had similar elevated troponin during last admission with [place] to develop. Repeat echocardiogram ordered. Cardiology consulted.  Negative stress test in October 2021.  PCI in December 22 for angina. Currently on IV heparin.  No ischemic work-up for now.  Continue Repatha on discharge. If tolerated should be on aspirin.  3.  Pneumonitis likely secondary to immunotherapy Recently admitted for this in the hospital. Pneumonia ruled out based on the work-up. Patient was treated with 3 days of IV antibiotics.  Recommendation was to continue antibiotic on discharge although has not received antibiotics since admission.  At present does not appear to have any pneumonia and Keflex will not be initiated. Patient is on prednisone prior to admission.  I would recommend continuing prednisone for another week before tapering it off. Repeat chest x-ray shows no acute abnormality.  4.  Adenocarcinoma of the lung, stage IV Outpatient immunotherapy currently on hold. On oral prednisone. Management per oncology outpatient. Prognosis guarded in the setting of stage IV malignancy which appears to be progressive with lymphangitic spread.  5.  Dyspnea Dry cough Symptomatology likely associated with her malignancy rather than any other etiology. Patient had tried multiple medications for dry cough and has not been effective.  Patient had concerns regarding opioids. Currently agreeable to try opioids for cough suppression as needed. Will monitor. Dyspnea appears to be intermittent and the patient remains on room air throughout the conversation with adequate saturation. At present suspect this is combination of frequent cough and attempt to suppress it as well as pneumonitis, malignancy, pleuritic chest pain as well as partial anxiety causing  her dyspnea. Monitor.  6.   PE On Eliquis. Currently on IV heparin. Likely will require either Xarelto or Lovenox.  Will discuss with oncology on discharge.  Hypokalemia.  Corrected. Hypophosphatemia.  Corrected.   Scheduled Meds:  dorzolamide-timolol  1 drop Both Eyes BID   ezetimibe  10 mg Oral Daily   guaiFENesin-dextromethorphan  10 mL Oral QID   latanoprost  1 drop Both Eyes QHS   mometasone-formoterol  2 puff Inhalation BID   nitroGLYCERIN  0.5 inch Topical Q6H   pantoprazole sodium  40 mg Oral Daily   [START ON 02/14/2021] predniSONE  60 mg Oral Q breakfast   Continuous Infusions:  heparin 1,000 Units/hr (02/13/21 1610)   PRN Meds: acetaminophen **OR** acetaminophen (TYLENOL) oral liquid 160 mg/5 mL **OR** acetaminophen, albuterol, HYDROcodone bit-homatropine, menthol-cetylpyridinium, senna-docusate  Body mass index is 22.25 kg/m.        DVT Prophylaxis: Therapeutic anticoagulation with heparin   Advance goals of care discussion: Pt is Full code.  Family Communication: no family was present at bedside, at the time of interview.  The pt provided permission to discuss medical plan with the family. Opportunity was given to ask question and all questions were answered satisfactorily.   Data Reviewed: I have personally reviewed and interpreted daily labs, tele strips, imaging. Electrolytes stable.  CBC stable.  Serum creatinine normal.  Troponins elevated but delta troponin not significantly.  Physical Exam:  General: Appear in mild distress, no Rash; Oral Mucosa Clear, moist. no Abnormal Neck Mass Or lumps, Conjunctiva normal  Cardiovascular: S1 and S2 Present, no Murmur, Respiratory: increased respiratory effort, Bilateral Air entry present and bilateral  Crackles, no wheezes Abdomen: Bowel Sound present, Soft and no tenderness Extremities: trace Pedal edema Neurology: alert and oriented to time, place, and person affect appropriate. no new focal deficit Gait not checked due to patient safety  concerns  Vitals:   02/13/21 1400 02/13/21 1500 02/13/21 1621 02/13/21 1626  BP:   (!) 141/76   Pulse:   82   Resp: 16 (!) 21 (!) 22   Temp:   98.6 F (37 C)   TempSrc:   Oral   SpO2:   100%   Weight:    58.8 kg  Height:    5\' 4"  (1.626 m)    Disposition:  Status is: Inpatient  Remains inpatient appropriate because:IV treatments appropriate due to intensity of illness or inability to take PO  Dispo: The patient is from: Home              Anticipated d/c is to: Home              Patient currently is not medically stable to d/c.   Difficult to place patient No  Time spent: 35 minutes. I reviewed all nursing notes, pharmacy notes, vitals, pertinent old records. I have discussed plan of care as described above with RN.  Author: Berle Mull, MD [facility] 02/13/2021 6:16 PM  To reach On-call, see care teams to locate the attending and reach out via [email or web address]. Between 7PM-7AM, please contact night-coverage If you still have difficulty reaching the attending provider, please page the [facility] (Director on Call) for [facility] on amion for assistance.

## 2021-02-14 DIAGNOSIS — R778 Other specified abnormalities of plasma proteins: Secondary | ICD-10-CM | POA: Diagnosis not present

## 2021-02-14 DIAGNOSIS — I6340 Cerebral infarction due to embolism of unspecified cerebral artery: Secondary | ICD-10-CM | POA: Diagnosis not present

## 2021-02-14 DIAGNOSIS — I2699 Other pulmonary embolism without acute cor pulmonale: Secondary | ICD-10-CM | POA: Diagnosis not present

## 2021-02-14 DIAGNOSIS — F419 Anxiety disorder, unspecified: Secondary | ICD-10-CM

## 2021-02-14 LAB — BASIC METABOLIC PANEL
Anion gap: 8 (ref 5–15)
BUN: 24 mg/dL — ABNORMAL HIGH (ref 8–23)
CO2: 26 mmol/L (ref 22–32)
Calcium: 9.2 mg/dL (ref 8.9–10.3)
Chloride: 104 mmol/L (ref 98–111)
Creatinine, Ser: 1.11 mg/dL — ABNORMAL HIGH (ref 0.44–1.00)
GFR, Estimated: 53 mL/min — ABNORMAL LOW (ref >60)
Glucose, Bld: 136 mg/dL — ABNORMAL HIGH (ref 70–99)
Potassium: 4.5 mmol/L (ref 3.5–5.1)
Sodium: 138 mmol/L (ref 135–145)

## 2021-02-14 LAB — TROPONIN I (HIGH SENSITIVITY): Troponin I (High Sensitivity): 903 ng/L (ref <18)

## 2021-02-14 LAB — HEPARIN LEVEL (UNFRACTIONATED)
Heparin Unfractionated: >1.1 IU/mL — ABNORMAL HIGH (ref 0.30–0.70)
Heparin Unfractionated: >1.1 IU/mL — ABNORMAL HIGH (ref 0.30–0.70)

## 2021-02-14 LAB — C-REACTIVE PROTEIN: CRP: 0.6 mg/dL (ref <1.0)

## 2021-02-14 LAB — APTT
aPTT: 59 seconds — ABNORMAL HIGH (ref 24–36)
aPTT: 82 seconds — ABNORMAL HIGH (ref 24–36)
aPTT: 103 seconds — ABNORMAL HIGH (ref 24–36)

## 2021-02-14 LAB — CBC
HCT: 31.7 % — ABNORMAL LOW (ref 36.0–46.0)
Hemoglobin: 10.5 g/dL — ABNORMAL LOW (ref 12.0–15.0)
MCH: 26.6 pg (ref 26.0–34.0)
MCHC: 33.1 g/dL (ref 30.0–36.0)
MCV: 80.3 fL (ref 80.0–100.0)
Platelets: 165 10*3/uL (ref 150–400)
RBC: 3.95 MIL/uL (ref 3.87–5.11)
RDW: 14.9 % (ref 11.5–15.5)
WBC: 11.6 10*3/uL — ABNORMAL HIGH (ref 4.0–10.5)
nRBC: 0 % (ref 0.0–0.2)

## 2021-02-14 LAB — PROCALCITONIN: Procalcitonin: <0.1 ng/mL

## 2021-02-14 LAB — MAGNESIUM: Magnesium: 2.4 mg/dL (ref 1.7–2.4)

## 2021-02-14 MED ORDER — MORPHINE SULFATE (PF) 2 MG/ML IV SOLN
2.0000 mg | INTRAVENOUS | Status: DC | PRN
  Administered 2021-02-14: 2 mg via INTRAVENOUS
  Filled 2021-02-14: qty 1

## 2021-02-14 MED ORDER — CARVEDILOL 6.25 MG PO TABS
6.2500 mg | ORAL_TABLET | Freq: Two times a day (BID) | ORAL | Status: DC
  Administered 2021-02-14 – 2021-02-16 (×4): 6.25 mg via ORAL
  Filled 2021-02-14 (×4): qty 1

## 2021-02-14 MED ORDER — ALPRAZOLAM 0.25 MG PO TABS
0.2500 mg | ORAL_TABLET | Freq: Two times a day (BID) | ORAL | Status: DC | PRN
  Administered 2021-02-14 – 2021-02-15 (×2): 0.25 mg via ORAL
  Filled 2021-02-14 (×2): qty 1

## 2021-02-14 MED ORDER — LORATADINE 10 MG PO TABS
10.0000 mg | ORAL_TABLET | Freq: Every day | ORAL | Status: DC
  Administered 2021-02-14 – 2021-02-16 (×3): 10 mg via ORAL
  Filled 2021-02-14 (×3): qty 1

## 2021-02-14 MED ORDER — ASPIRIN 81 MG PO CHEW
81.0000 mg | CHEWABLE_TABLET | Freq: Every day | ORAL | Status: DC
  Administered 2021-02-14 – 2021-02-16 (×3): 81 mg via ORAL
  Filled 2021-02-14 (×3): qty 1

## 2021-02-14 NOTE — Progress Notes (Signed)
[place] for Heparin  Indication: recent pulmonary embolus on apixaban, new embolic stroke  Allergies  Allergen Reactions   Penicillins Other (See Comments)    SYNCOPE PATIENT HAS HAD A PCN REACTION WITH IMMEDIATE RASH, FACIAL/TONGUE/THROAT SWELLING, SOB, OR LIGHTHEADEDNESS WITH HYPOTENSION:  #  #  YES  #  # Has patient had a PCN reaction causing severe rash involving mucus membranes or skin necrosis: No Has patient had a PCN reaction that required hospitalization: No Has patient had a PCN reaction occurring within the last 10 years: No    Vicodin [Hydrocodone-Acetaminophen] Other (See Comments)    Sped up heart and breathing   Other Other (See Comments)    Glaucoma eye drop   Patient Measurements: Height: 5\' 4"  (162.6 cm) Weight: 58.8 kg (129 lb 10.1 oz) IBW/kg (Calculated) : 54.7 Heparin Dosing Weight: 58 kg  Vital Signs: Temp: 98.3 F (36.8 C) (08/29 0003) Temp Source: Oral (08/29 0003) BP: 136/78 (08/29 0003) Pulse Rate: 74 (08/29 0003)  Labs: Recent Labs    02/12/21 1040 02/13/21 0417 02/13/21 1141 02/13/21 1425 02/14/21 0113  HGB 11.4* 12.1  --   --  10.5*  HCT 35.5* 37.1  --   --  31.7*  PLT 152 158  --   --  165  APTT  --  44* 48*  --  59*  HEPARINUNFRC  --  >1.10* >1.10*  --  >1.10*  CREATININE 0.76 0.96  --   --  1.11*  TROPONINIHS  --   --  1,008* 1,013*  --      Estimated Creatinine Clearance: 40.7 mL/min (A) (by C-G formula based on SCr of 1.11 mg/dL (H)).   Medical History: Past Medical History:  Diagnosis Date   Anxiety    Arthritis    Asthma    exercise induced   Depression    PMH   GERD (gastroesophageal reflux disease)    Glaucoma    History of radiation therapy 01/05/2021   IMRT right lung  11/24/2020-01/05/2021  Dr [name]   Hypertension    lung ca dx'd 11/2017   right   Malignant pleural effusion    right   PONV (postoperative nausea and vomiting)    Pre-diabetes    Raynaud's disease     Raynaud's disease      Assessment: 70 y/o F with a h/o lung cancer discharged on 8/25 with Eliquis for PE. Patient was admitted with neurologic symptoms and found to have multifocal acute and subacute ischemic infarcts. Plan is to transfer patient to [facility] for neuro workup. Pharmacy consulted to dose and monitor transition to heparin infusion.   8/29 AM update:  aPTT low but trending up  Goal of Therapy:  Heparin level 0.3-0.5 units/ml aPTT 66-85 seconds Monitor platelets by anticoagulation protocol: Yes   Plan:  Inc heparin to 1100 units/hr 1200 heparin level and aPTT Monitor for bleeding  Narda Bonds, PharmD, BCPS Clinical Pharmacist Phone: [phone]

## 2021-02-14 NOTE — Progress Notes (Signed)
[facility] Progress Note  Patient: Allison [name]    ZHY:[id number]  DOA: 02/11/2021     Date of Service: the patient was seen and examined on 02/14/2021  Brief hospital course: Past medical history of HTN, adenocarcinoma of right lung, history of chemotherapy and radiation, recent PE DVT, CAD.  Presents with complaints of dizziness and abnormal gait.  Found to have acute CVA.  Initially admitted requested hospital.  Transfer to [facility] for further evaluation. Neurology consulted. Had some chest pain and therefore cardiology was consulted due to positive troponins. Currently plan is complete stroke work-up and monitor for stability from cardiovascular perspective follow-up on PT OT recommendation.  Subjective: Reports increasing chest tightness with significantly yesterday.  No nausea no vomiting with no fever no chills.  Continues to have cough.  Has not used her narcotics.  No fever no chills.  Assessment and Plan: 1.  Bilateral cerebral and cerebellar infarcts likely embolic in nature in the setting of hypercoagulable state due to stage IV lung cancer. CT head read cellular hyperdensity. CTA questions occlusion of left vertebral artery at skull base MRI shows patchy multifocal acute to subacute and ischemic infarct in bilateral cerebral and cerebellar hemisphere. Neurology consulted. Currently on IV heparin. Patient was on Eliquis prior to admission.  Will discuss with hematology regarding anticoagulation recommendation on on discharge. Repeat MRI in 3 to 4 weeks recommended to rule out metastasis although appearance is more likely consistent with stroke.  PT recommends home therapy.  2.  Chest pain History of CAD Elevated troponin Patient reports ongoing chest pain which appears to be more associated with her malignancy and radiation history. EKG unremarkable for any acute ischemic events. Troponins were elevated although delta troponin not significantly  elevated. Patient had similar elevated troponin during last admission with [place] to develop. Repeat echocardiogram shows preserved EF without any wall Motion abnormality.  Repeat troponin negative Despite of ongoing chest pain and EKG unremarkable for any acute ischemia as well. Cardiology consulted.  Negative stress test in October 2021.  PCI in December 22 for angina. Currently on IV heparin.  No ischemic work-up for now.  Continue Repatha on discharge. If tolerated should be on aspirin.  3.  Pneumonitis likely secondary to immunotherapy Recently admitted for this in the hospital. Pneumonia ruled out based on the work-up. Patient was treated with 3 days of IV antibiotics.  Recommendation was to continue antibiotic on discharge although has not received antibiotics since admission.  At present does not appear to have any pneumonia and Keflex will not be initiated. Patient is on prednisone prior to admission.  I would recommend continuing prednisone for another week before tapering it off. Repeat chest x-ray shows no acute abnormality.  4.  Adenocarcinoma of the lung, stage IV Outpatient immunotherapy currently on hold. On oral prednisone. Management per oncology outpatient. Prognosis guarded in the setting of stage IV malignancy which appears to be progressive with lymphangitic spread.  5.  Dyspnea Dry cough Symptomatology likely associated with her malignancy rather than any other etiology. Patient had tried multiple medications for dry cough and has not been effective.  Patient had concerns regarding opioids. Currently agreeable to try opioids for cough suppression as needed. Will monitor. Dyspnea appears to be intermittent and the patient remains on room air throughout the conversation with adequate saturation. At present suspect this is combination of frequent cough and attempt to suppress it as well as pneumonitis, malignancy, pleuritic chest pain as well as partial anxiety causing  her dyspnea. Monitor.  6.  PE On Eliquis. Currently on IV heparin. Likely will require either Xarelto or Lovenox.  Will discuss with oncology on discharge.  Hypokalemia.  Corrected. Hypophosphatemia.  Corrected.   Scheduled Meds:  aspirin  81 mg Oral Daily   carvedilol  6.25 mg Oral BID WC   dorzolamide-timolol  1 drop Both Eyes BID   ezetimibe  10 mg Oral Daily   guaiFENesin-dextromethorphan  10 mL Oral QID   latanoprost  1 drop Both Eyes QHS   loratadine  10 mg Oral Daily   mometasone-formoterol  2 puff Inhalation BID   nitroGLYCERIN  0.5 inch Topical Q6H   pantoprazole sodium  40 mg Oral Daily   predniSONE  60 mg Oral Q breakfast   Continuous Infusions:  heparin 1,050 Units/hr (02/14/21 1621)   PRN Meds: acetaminophen **OR** acetaminophen (TYLENOL) oral liquid 160 mg/5 mL **OR** acetaminophen, albuterol, ALPRAZolam, HYDROcodone bit-homatropine, menthol-cetylpyridinium, morphine injection, senna-docusate  Body mass index is 22.25 kg/m.        DVT Prophylaxis: Therapeutic anticoagulation with heparin   Advance goals of care discussion: Pt is Full code.  Family Communication: no family was present at bedside, at the time of interview.   Data Reviewed: I have personally reviewed and interpreted daily labs, tele strips, imaging. Sodium 138, serum creatinine 1.11, troponin 903, CBC stable.  Physical Exam:  General: Appear in mild distress, no Rash; Oral Mucosa Clear, moist. no Abnormal Neck Mass Or lumps, Conjunctiva normal  Cardiovascular: S1 and S2 Present, no Murmur, Respiratory: good respiratory effort, Bilateral Air entry present and faint Crackles, no wheezes Abdomen: Bowel Sound present, Soft and no tenderness Extremities: no Pedal edema Neurology: alert and oriented to time, place, and person affect appropriate. no new focal deficit Gait not checked due to patient safety concerns   Vitals:   02/14/21 0354 02/14/21 0746 02/14/21 1126 02/14/21 1603  BP:  136/84 (!) 151/85 (!) 150/77 (!) 154/85  Pulse: 70 69 70 (!) 101  Resp:  18  20  Temp: 98.4 F (36.9 C) 98.1 F (36.7 C) 98.3 F (36.8 C) 98.2 F (36.8 C)  TempSrc: Oral Oral Oral Oral  SpO2: 97% 100% 98% 100%  Weight:      Height:        Disposition:  Status is: Inpatient  Remains inpatient appropriate because:IV treatments appropriate due to intensity of illness or inability to take PO  Dispo: The patient is from: Home              Anticipated d/c is to: Home              Patient currently is not medically stable to d/c.   Difficult to place patient No  Time spent: 35 minutes. I reviewed all nursing notes, pharmacy notes, vitals, pertinent old records. I have discussed plan of care as described above with RN.  Author: Berle Mull, MD [facility] 02/14/2021 7:39 PM  To reach On-call, see care teams to locate the attending and reach out via [email or web address]. Between 7PM-7AM, please contact night-coverage If you still have difficulty reaching the attending provider, please page the [facility] (Director on Call) for [facility] on amion for assistance.

## 2021-02-14 NOTE — Progress Notes (Signed)
[place] for Heparin  Indication: recent pulmonary embolus on apixaban, new embolic stroke  Allergies  Allergen Reactions   Penicillins Other (See Comments)    SYNCOPE PATIENT HAS HAD A PCN REACTION WITH IMMEDIATE RASH, FACIAL/TONGUE/THROAT SWELLING, SOB, OR LIGHTHEADEDNESS WITH HYPOTENSION:  #  #  YES  #  # Has patient had a PCN reaction causing severe rash involving mucus membranes or skin necrosis: No Has patient had a PCN reaction that required hospitalization: No Has patient had a PCN reaction occurring within the last 10 years: No    Vicodin [Hydrocodone-Acetaminophen] Other (See Comments)    Sped up heart and breathing   Other Other (See Comments)    Glaucoma eye drop   Patient Measurements: Height: 5\' 4"  (162.6 cm) Weight: 58.8 kg (129 lb 10.1 oz) IBW/kg (Calculated) : 54.7 Heparin Dosing Weight: 58 kg  Vital Signs: Temp: 98.3 F (36.8 C) (08/29 1126) Temp Source: Oral (08/29 1126) BP: 150/77 (08/29 1126) Pulse Rate: 70 (08/29 1126)  Labs: Recent Labs    02/12/21 1040 02/12/21 1040 02/13/21 0417 02/13/21 1141 02/13/21 1425 02/14/21 0113 02/14/21 0923 02/14/21 1152  HGB 11.4*  --  12.1  --   --  10.5*  --   --   HCT 35.5*  --  37.1  --   --  31.7*  --   --   PLT 152  --  158  --   --  165  --   --   APTT  --    < > 44* 48*  --  59* 82*  --   HEPARINUNFRC  --    < > >1.10* >1.10*  --  >1.10*  --  >1.10*  CREATININE 0.76  --  0.96  --   --  1.11*  --   --   TROPONINIHS  --   --   --  1,008* 1,013*  --  903*  --    < > = values in this interval not displayed.    Estimated Creatinine Clearance: 40.7 mL/min (A) (by C-G formula based on SCr of 1.11 mg/dL (H)).   Medical History: Past Medical History:  Diagnosis Date   Anxiety    Arthritis    Asthma    exercise induced   Depression    PMH   GERD (gastroesophageal reflux disease)    Glaucoma    History of radiation therapy 01/05/2021   IMRT right lung   11/24/2020-01/05/2021  Dr [name]   Hypertension    lung ca dx'd 11/2017   right   Malignant pleural effusion    right   PONV (postoperative nausea and vomiting)    Pre-diabetes    Raynaud's disease    Raynaud's disease      70 y/o F with a h/o lung cancer discharged on 8/25 with Eliquis for PE. Patient was admitted with neurologic symptoms and found to have multifocal acute and subacute ischemic infarcts. Pharmacy consulted to dose and monitor transition to heparin infusion.    Heparin levels remain > 1.1 due to recent Apixaban. Heparin increased this AM to 1100 units/hr. Repeat aPTT now at higher end of therapeutic range. Will decrease slightly.     Goal of Therapy:  Heparin level 0.3-0.5 units/ml aPTT 66-85 seconds Monitor platelets by anticoagulation protocol: Yes   Plan:  - Decrease slightly to heparin 1050 units/hr - Check aPTT level in 8 hours - Continue daily heparin and aPTT levels - Continue to monitor  H&H and platelets     Thank you for allowing pharmacy to be a part of this patient's care.   Ardyth Harps, PharmD Clinical Pharmacist

## 2021-02-14 NOTE — Progress Notes (Signed)
[place] for Heparin  Indication:h/o PE  Allergies  Allergen Reactions   Penicillins Other (See Comments)    SYNCOPE PATIENT HAS HAD A PCN REACTION WITH IMMEDIATE RASH, FACIAL/TONGUE/THROAT SWELLING, SOB, OR LIGHTHEADEDNESS WITH HYPOTENSION:  #  #  YES  #  # Has patient had a PCN reaction causing severe rash involving mucus membranes or skin necrosis: No Has patient had a PCN reaction that required hospitalization: No Has patient had a PCN reaction occurring within the last 10 years: No    Vicodin [Hydrocodone-Acetaminophen] Other (See Comments)    Sped up heart and breathing   Other Other (See Comments)    Glaucoma eye drop   Patient Measurements: Height: 5\' 4"  (162.6 cm) Weight: 58.8 kg (129 lb 10.1 oz) IBW/kg (Calculated) : 54.7 Heparin Dosing Weight: 58 kg  Vital Signs: Temp: 98.2 F (36.8 C) (08/29 2025) Temp Source: Oral (08/29 2025) BP: 155/87 (08/29 2025) Pulse Rate: 79 (08/29 2025)  Labs: Recent Labs    02/12/21 1040 02/12/21 1040 02/13/21 0417 02/13/21 1141 02/13/21 1425 02/14/21 0113 02/14/21 0923 02/14/21 1152 02/14/21 2243  HGB 11.4*  --  12.1  --   --  10.5*  --   --   --   HCT 35.5*  --  37.1  --   --  31.7*  --   --   --   PLT 152  --  158  --   --  165  --   --   --   APTT  --    < > 44* 48*  --  59* 82*  --  103*  HEPARINUNFRC  --    < > >1.10* >1.10*  --  >1.10*  --  >1.10*  --   CREATININE 0.76  --  0.96  --   --  1.11*  --   --   --   TROPONINIHS  --   --   --  1,008* 1,013*  --  903*  --   --    < > = values in this interval not displayed.     Estimated Creatinine Clearance: 40.7 mL/min (A) (by C-G formula based on SCr of 1.11 mg/dL (H)).   Assessment: 70 y.o. female admitted with CVA, h/o PE and Eliquis on hold, for heparin   Goal of Therapy:  Heparin level 0.3-0.5 units/ml aPTT 66-85 seconds Monitor platelets by anticoagulation protocol: Yes   Plan:  Decrease Heparin 900  units/hr Follow-up am labs.  Phillis Knack, PharmD, BCPS

## 2021-02-14 NOTE — Progress Notes (Signed)
Progress Note  Patient Name: Allison [name] Date of Encounter: 02/14/2021  [facility] Cardiologist: Donato Heinz, MD   Subjective   Having chest pain this morning, worse with deep inspiration  Inpatient Medications    Scheduled Meds:  aspirin  81 mg Oral Daily   carvedilol  6.25 mg Oral BID WC   dorzolamide-timolol  1 drop Both Eyes BID   ezetimibe  10 mg Oral Daily   guaiFENesin-dextromethorphan  10 mL Oral QID   latanoprost  1 drop Both Eyes QHS   loratadine  10 mg Oral Daily   mometasone-formoterol  2 puff Inhalation BID   nitroGLYCERIN  0.5 inch Topical Q6H   pantoprazole sodium  40 mg Oral Daily   predniSONE  60 mg Oral Q breakfast   Continuous Infusions:  heparin 1,100 Units/hr (02/14/21 0357)   PRN Meds: acetaminophen **OR** acetaminophen (TYLENOL) oral liquid 160 mg/5 mL **OR** acetaminophen, albuterol, ALPRAZolam, HYDROcodone bit-homatropine, menthol-cetylpyridinium, morphine injection, senna-docusate   Vital Signs    Vitals:   02/13/21 2018 02/14/21 0003 02/14/21 0354 02/14/21 0746  BP:  136/78 136/84 (!) 151/85  Pulse:  74 70 69  Resp:  15  18  Temp:  98.3 F (36.8 C) 98.4 F (36.9 C) 98.1 F (36.7 C)  TempSrc:  Oral Oral Oral  SpO2: 98% 98% 97% 100%  Weight:      Height:        Intake/Output Summary (Last 24 hours) at 02/14/2021 0903 Last data filed at 02/13/2021 1610 Gross per 24 hour  Intake 310.89 ml  Output --  Net 310.89 ml   Last 3 Weights 02/13/2021 02/07/2021 02/07/2021  Weight (lbs) 129 lb 10.1 oz 127 lb 127 lb 1.6 oz  Weight (kg) 58.8 kg 57.607 kg 57.652 kg      Telemetry    Normal sinus rhythm, PVCs, rate 70s to 100s.- Personally Reviewed  ECG    Normal sinus rhythm, rate 80, no ST abnormalities personally Reviewed  Physical Exam   GEN: No acute distress.   Neck: No JVD Cardiac: RRR, no murmurs, rubs, or gallops.  Respiratory: Scattered crackles GI: Soft, nontender, non-distended  MS: No edema; No  deformity. Neuro:  Nonfocal  Psych: Normal affect   Labs    High Sensitivity Troponin:   Recent Labs  Lab 02/07/21 1838 02/07/21 2314 02/08/21 0154 02/13/21 1141 02/13/21 1425  TROPONINIHS 206* 184* 149* 1,008* 1,013*      Chemistry Recent Labs  Lab 02/11/21 1811 02/12/21 1040 02/13/21 0417 02/14/21 0113  NA 139 137 138 138  K 2.8* 2.9* 4.7 4.5  CL 99 106 105 104  CO2 28 25 26 26   GLUCOSE 239* 102* 139* 136*  BUN 19 12 10  24*  CREATININE 1.02* 0.76 0.96 1.11*  CALCIUM 9.5 8.0* 9.2 9.2  PROT 7.0 5.7* 6.3*  --   ALBUMIN 3.5 2.8* 3.0*  --   AST 39 29 36  --   ALT 43 31 46*  --   ALKPHOS 58 46 53  --   BILITOT 0.3 0.5 0.6  --   GFRNONAA 59* >60 >60 53*  ANIONGAP 12 6 7 8      Hematology Recent Labs  Lab 02/12/21 1040 02/13/21 0417 02/14/21 0113  WBC 10.2 6.9 11.6*  RBC 4.34 4.66 3.95  HGB 11.4* 12.1 10.5*  HCT 35.5* 37.1 31.7*  MCV 81.8 79.6* 80.3  MCH 26.3 26.0 26.6  MCHC 32.1 32.6 33.1  RDW 14.6 14.8 14.9  PLT 152 158  165    BNP Recent Labs  Lab 02/07/21 1640  BNP 35.4     DDimer No results for input(s): DDIMER in the last 168 hours.   Radiology    CT ABDOMEN PELVIS WO CONTRAST  Result Date: 02/12/2021 CLINICAL DATA:  Abdominal pain. EXAM: CT ABDOMEN AND PELVIS WITHOUT CONTRAST TECHNIQUE: Multidetector CT imaging of the abdomen and pelvis was performed following the standard protocol without IV contrast. COMPARISON:  CT abdomen pelvis dated/CT dated 10/11/2020. FINDINGS: Evaluation of this exam is limited in the absence of intravenous contrast. Lower chest: There is consolidative changes of the majority of the visualized right lung base. A partially seen right-sided pleural effusion and diffusely thickened appearance of the right pleural, likely representing malignant effusion and pleural implants. Innumerable nodules within the left lung base may represent metastatic disease although an atypical infection is not excluded. These findings are new  compared to the prior CT of 10/11/2020 most consistent with progression of metastatic disease. There is coronary vascular calcification. No intra-abdominal free air or free fluid. Hepatobiliary: No focal liver abnormality is seen. No gallstones, gallbladder wall thickening, or biliary dilatation. Pancreas: Unremarkable. No pancreatic ductal dilatation or surrounding inflammatory changes. Spleen: Normal in size without focal abnormality. Adrenals/Urinary Tract: Adrenal glands are unremarkable. Kidneys are normal, without renal calculi, focal lesion, or hydronephrosis. Bladder is unremarkable. Stomach/Bowel: Small scattered sigmoid diverticula without active inflammatory changes. There is moderate stool throughout the colon. There is no bowel obstruction or active inflammation. The appendix is not visualized with certainty. No inflammatory changes identified in the right lower quadrant. Vascular/Lymphatic: Mild aortoiliac atherosclerotic disease the IVC is unremarkable. Portal venous gas. There is no adenopathy. Reproductive: Hysterectomy.  No adnexal masses. Other: None Musculoskeletal: Degenerative changes of the spine. There is a transitional vertebra. There is disc desiccation and vacuum phenomena at L5-S1. Lower lumbar facet arthropathy. No acute osseous pathology. IMPRESSION: 1. No acute intra-abdominal or pelvic pathology. No evidence of metastatic disease within the abdomen or pelvis. 2. Colonic diverticulosis. No bowel obstruction. 3. Progression of metastatic disease in the lung bases. 4. Aortic Atherosclerosis (ICD10-I70.0). Electronically Signed   By: Anner Crete M.D.   On: 02/12/2021 20:39   CT ANGIO HEAD W OR WO CONTRAST  Result Date: 02/13/2021 CLINICAL DATA:  Follow-up examination for acute stroke. EXAM: CT ANGIOGRAPHY HEAD AND NECK TECHNIQUE: Multidetector CT imaging of the head and neck was performed using the standard protocol during bolus administration of intravenous contrast. Multiplanar  CT image reconstructions and MIPs were obtained to evaluate the vascular anatomy. Carotid stenosis measurements (when applicable) are obtained utilizing NASCET criteria, using the distal internal carotid diameter as the denominator. CONTRAST:  22mL OMNIPAQUE IOHEXOL 350 MG/ML SOLN COMPARISON:  Prior MRI from 02/11/2021 and CT from 02/12/2021. FINDINGS: CTA NECK FINDINGS Aortic arch: Visualized aortic arch normal in caliber with normal 3 vessel morphology. No stenosis seen about the origin of the great vessels. Right carotid system: Right common and internal carotid arteries patent without stenosis, dissection or occlusion. Left carotid system: Left common and internal carotid arteries patent without stenosis, dissection, or occlusion. Mild eccentric calcified plaque about the left bifurcation without significant stenosis. Vertebral arteries: Both vertebral arteries arise from the subclavian arteries. Dominant right vertebral artery widely patent within the neck without abnormality. Left vertebral artery patent proximally, but becomes increasingly attenuated as it courses cephalad, and occludes by the level of the skull base. This could reflect an underlying dissection. Skeleton: No visible acute osseous finding. No discrete or worrisome  osseous lesions. Degenerative spondylosis noted at C4-5 through C6-7 without high-grade spinal stenosis. Degenerative changes noted about the left TMJ. Other neck: No other acute soft tissue abnormality within the neck. No mass or adenopathy. Few scattered right thyroid nodules noted, largest of which measures 11 mm. Findings of doubtful significance given size and patient age, no follow-up imaging recommended (ref: J Am Coll Radiol. 2015 Feb;12(2): 143-50). Upper chest: Previously identified spiculated mass at the right lung apex again seen, similar to previous. Circumferential pleuroparenchymal thickening about the right lung with probable loculated fluid along the right major  fissure, partially visualized, and concerning for pleural involvement of disease. Diffusely irregular nodularity and interlobular septal thickening again seen, consistent with lymphangitic spread of tumor. Irregular nodularity and ground-glass opacity within the visualized left lung could be related to lymphangitic spread of tumor and/or infection. Review of the MIP images confirms the above findings CTA HEAD FINDINGS Anterior circulation: Petrous segments patent bilaterally. Mild atheromatous change within the carotid siphons without hemodynamically significant stenosis. Tiny 2 mm outpouching arising from the cavernous left ICA could reflect a small aneurysm versus vascular infundibulum (series 6, image 225). Left A1 segment widely patent. Right A1 hypoplastic and/or absent. Normal anterior communicating artery complex. Both ACAs patent to their distal aspects, with the left being dominant. No M1 stenosis or occlusion. Normal MCA bifurcations. Distal MCA branches perfused and symmetric, with no visible proximal MCA branch occlusion. Posterior circulation: Right vertebral artery patent to the vertebrobasilar junction without stenosis. Right PICA patent. Left vertebral artery occluded at the skull base. Minimal retrograde filling into the distal left V4 segment across the vertebrobasilar junction noted (series 6, image 209). Left PICA not visualized. Basilar patent to its distal aspect without stenosis. Superior cerebral arteries patent bilaterally. Left PCA supplied via the basilar. Right PCA supplied via the basilar as well as a prominent right posterior communicating artery. PCAs patent to their distal aspects without stenosis. Venous sinuses: Grossly patent allowing for timing the contrast bolus. Anatomic variants: None significant. Review of the MIP images confirms the above findings IMPRESSION: 1. Increasing attenuation within the left vertebral artery as it courses cephalad within the neck, with occlusion at  the skull base. Finding is age indeterminate, but could reflect changes of an underlying dissection. 2. Mild atheromatous change elsewhere about the major arterial vasculature of the head and neck. No other large vessel occlusion. No other proximal high-grade or correctable stenosis. 3. 2 mm outpouching arising from the cavernous left ICA, which could reflect a small aneurysm versus vascular infundibulum. 4. Spiculated right apical mass, consistent with known malignancy. Diffuse pleuroparenchymal thickening about the right lung with scattered interlobular septal thickening and nodularity within the remainder of the visualized lungs, concerning for lymphangitic spread of tumor. Superimposed infection could be considered in the correct clinical setting. Electronically Signed   By: Jeannine Boga M.D.   On: 02/13/2021 02:54   DG Chest 2 View  Result Date: 02/13/2021 CLINICAL DATA:  Shortness of breath for several weeks. History of lung cancer. EXAM: CHEST - 2 VIEW COMPARISON:  Chest x-ray dated 02/07/2021. Chest CT dated 02/07/2021. FINDINGS: Heart size and mediastinal contours are stable. Dense opacity within the RIGHT lower lung is unchanged, corresponding to the mix of consolidations and pleural effusion better demonstrated on earlier CT. Additional mild interstitial prominence throughout the RIGHT upper lobe and LEFT lung is stable. Spiculated mass within the RIGHT lung apex is better demonstrated on earlier chest CT. IMPRESSION: 1. Stable chest x-ray. 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 2. Interstitial prominence bilaterally, suspicious for diffuse lymphangitic spread of malignancy. 3. Spiculated mass within the RIGHT lung apex is better demonstrated on  earlier chest CT of 02/07/2021. Electronically Signed   By: Franki Cabot M.D.   On: 02/13/2021 15:25   CT ANGIO NECK W OR WO CONTRAST  Result Date: 02/13/2021 CLINICAL DATA:  Follow-up examination for acute stroke. EXAM: CT ANGIOGRAPHY HEAD AND NECK TECHNIQUE: Multidetector CT imaging of the head and neck was performed using the standard protocol during bolus administration of intravenous contrast. Multiplanar CT image reconstructions and MIPs were obtained to evaluate the vascular anatomy. Carotid stenosis measurements (when applicable) are obtained utilizing NASCET criteria, using the distal internal carotid diameter as the denominator. CONTRAST:  37mL OMNIPAQUE IOHEXOL 350 MG/ML SOLN COMPARISON:  Prior MRI from 02/11/2021 and CT from 02/12/2021. FINDINGS: CTA NECK FINDINGS Aortic arch: Visualized aortic arch normal in caliber with normal 3 vessel morphology. No stenosis seen about the origin of the great vessels. Right carotid system: Right common and internal carotid arteries patent without stenosis, dissection or occlusion. Left carotid system: Left common and internal carotid arteries patent without stenosis, dissection, or occlusion. Mild eccentric calcified plaque about the left bifurcation without significant stenosis. Vertebral arteries: Both vertebral arteries arise from the subclavian arteries. Dominant right vertebral artery widely patent within the neck without abnormality. Left vertebral artery patent proximally, but becomes increasingly attenuated as it courses cephalad, and occludes by the level of the skull base. This could reflect an underlying dissection. Skeleton: No visible acute osseous finding. No discrete or worrisome osseous lesions. Degenerative spondylosis noted at C4-5 through C6-7 without high-grade spinal stenosis. Degenerative changes noted about the left TMJ. Other neck: No other acute soft tissue abnormality within the neck. No mass or adenopathy. Few scattered right thyroid  nodules noted, largest of which measures 11 mm. Findings of doubtful significance given size and patient age, no follow-up imaging recommended (ref: J Am Coll Radiol. 2015 Feb;12(2): 143-50). Upper chest: Previously identified spiculated mass at the right lung apex again seen, similar to previous. Circumferential pleuroparenchymal thickening about the right lung with probable loculated fluid along the right major fissure, partially visualized, and concerning for pleural involvement of disease. Diffusely irregular nodularity and interlobular septal thickening again seen, consistent with lymphangitic spread of tumor. Irregular nodularity and ground-glass opacity within the visualized left lung could be related to lymphangitic spread of tumor and/or infection. Review of the MIP images confirms the above findings CTA HEAD FINDINGS Anterior circulation: Petrous segments patent bilaterally. Mild atheromatous change within the carotid siphons without hemodynamically significant stenosis. Tiny 2 mm outpouching arising from the cavernous left ICA could reflect a small aneurysm versus vascular infundibulum (series 6, image 225). Left A1 segment widely patent. Right A1 hypoplastic and/or absent. Normal anterior communicating artery complex. Both ACAs patent to their distal aspects, with the left being dominant. No M1 stenosis or occlusion. Normal MCA bifurcations. Distal MCA branches perfused and symmetric, with no visible proximal MCA branch occlusion. Posterior circulation: Right vertebral artery patent to the vertebrobasilar junction without stenosis. Right PICA patent. Left vertebral artery occluded at the skull base. Minimal retrograde filling into the distal left V4 segment across the vertebrobasilar junction noted (series 6, image 209). Left PICA not visualized. Basilar patent to its distal aspect without  stenosis. Superior cerebral arteries patent bilaterally. Left PCA supplied via the basilar. Right PCA supplied via  the basilar as well as a prominent right posterior communicating artery. PCAs patent to their distal aspects without stenosis. Venous sinuses: Grossly patent allowing for timing the contrast bolus. Anatomic variants: None significant. Review of the MIP images confirms the above findings IMPRESSION: 1. Increasing attenuation within the left vertebral artery as it courses cephalad within the neck, with occlusion at the skull base. Finding is age indeterminate, but could reflect changes of an underlying dissection. 2. Mild atheromatous change elsewhere about the major arterial vasculature of the head and neck. No other large vessel occlusion. No other proximal high-grade or correctable stenosis. 3. 2 mm outpouching arising from the cavernous left ICA, which could reflect a small aneurysm versus vascular infundibulum. 4. Spiculated right apical mass, consistent with known malignancy. Diffuse pleuroparenchymal thickening about the right lung with scattered interlobular septal thickening and nodularity within the remainder of the visualized lungs, concerning for lymphangitic spread of tumor. Superimposed infection could be considered in the correct clinical setting. Electronically Signed   By: Jeannine Boga M.D.   On: 02/13/2021 02:54   ECHOCARDIOGRAM LIMITED  Result Date: 02/13/2021    ECHOCARDIOGRAM LIMITED REPORT   Patient Name:   Allison [name] Cheeks Date of Exam: 02/13/2021 Medical Rec #:  [id number]       Height:       64.0 in Accession #:    [id number]      Weight:       127.0 lb Date of Birth:  04/06/1951       BSA:          1.613 m Patient Age:    70 years        BP:           138/84 mmHg Patient Gender: F               HR:           84 bpm. Exam Location:  Inpatient Procedure: Limited Echo and Color Doppler Indications:    Elevated troponin  History:        Patient has prior history of Echocardiogram examinations, most                 recent 02/08/2021. CAD. H/o DVT and pulmonary embolus.  Sonographer:     Clayton Lefort RDCS (AE) Referring Phys: [id number] [place]  1. Left ventricular ejection fraction, by estimation, is 60 to 65%. The left ventricle has normal function. The left ventricle has no regional wall motion abnormalities.  2. The mitral valve is normal in structure. Mild to moderate mitral valve regurgitation. Comparison(s): No significant change from prior study. Conclusion(s)/Recommendation(s): Limited echo for wall motion/EF, both normal and unchanged from echo 02/08/21. FINDINGS  Left Ventricle: Left ventricular ejection fraction, by estimation, is 60 to 65%. The left ventricle has normal function. The left ventricle has no regional wall motion abnormalities. The left ventricular internal cavity size was normal in size. Mitral Valve: The mitral valve is normal in structure. Mild to moderate mitral valve regurgitation. Buford Dresser MD Electronically signed by Buford Dresser MD Signature Date/Time: 02/13/2021/6:39:03 PM    Final     Cardiac Studies   Echo 02/13/2021:   1. Left ventricular ejection fraction, by estimation, is 60 to 65%. The  left ventricle has normal function. The left ventricle has no regional  wall motion abnormalities.   2. The mitral valve is  normal in structure. Mild to moderate mitral valve  regurgitation.   Patient Profile     70 y.o. female with a hx of CAD status post DES to RCA 06/16/19, stage IV lung cancer, hypertension, hyperlipidemia, recent DVT/PE who presents with acute CVA and troponin elevation  Assessment & Plan    Elevated troponin: Troponin 1008 > 1013.  Echo shows no new wall motion abnormality.  Suspect demand ischemia in setting of acute CVA and recent PE -In setting of acute CVA and considering no new WMA on echo plan medical management unless significant troponin elevation.  Having more chest pain this morning, agree with repeat troponin  PE: Was on Eliquis prior to discharge.  Currently on IV heparin.  Agree with  discussing with hematology best anticoagulation moving forward.  CAD: Status post drug-eluting stent to RCA on 06/16/2019.   -Continue aspirin 81 mg daily.   -Continue Coreg 6.25 mg twice daily -Has had issues starting statins due to transaminitis and myalgias.  Would continue Praluent   Hyperlipidemia: LDL 91 on 02/12/2021.  Continue Praluent   Hypertension: On carvedilol 6.25 mg twice daily   Stage IV lung cancer: Followed by oncology   For questions or updates, please contact [place] Please consult [email or web address] for contact info under        Signed, Donato Heinz, MD  02/14/2021, 9:03 AM

## 2021-02-14 NOTE — Progress Notes (Signed)
Physical Therapy Treatment Patient Details Name: Allison [name] MRN: [id number] DOB: 07-Jan-1951 Today's Date: 02/14/2021    History of Present Illness Patient is a 70 year old female presenting to ED with blurry vision gait deviations, listing to L. MRI reveal Patchy multifocal acute to subacute ischemic infarcts involving  the bilateral cerebral and cerebellar hemispheres. Patient recently D/C from hospital after treatment for PNA, acute PT and DVT. PMH includes lung CA, CAD, MI, glaucoma    PT Comments    Pt progressing well towards physical therapy goals. She did endorse chest tightness today, which did not change with exertion. By end of session, 3/4 DOE was noted and pt reports this has been progressively worsening since PTA. Pt scored 18/24 on the DGI, indicating a higher risk for falls. She did better with 1 UE support while ambulating in the hall, and may want to trial the [facility] next session and tennis shoes. Will continue to follow.    Follow Up Recommendations  Outpatient PT;Supervision for mobility/OOB (for generalized strength, and balance training)     Equipment Recommendations  None recommended by PT    Recommendations for Other Services       Precautions / Restrictions Precautions Precautions: Fall Restrictions Weight Bearing Restrictions: No    Mobility  Bed Mobility Overal bed mobility: Modified Independent             General bed mobility comments: HOB elevated. Increased time to transition to EOB and to get linens off feet    Transfers Overall transfer level: Needs assistance Equipment used: None Transfers: Sit to/from Stand Sit to Stand: Supervision         General transfer comment: Close supervision for safety. No assist required.  Ambulation/Gait Ambulation/Gait assistance: Supervision Gait Distance (Feet): 200 Feet Assistive device: None Gait Pattern/deviations: Step-through pattern;Decreased step length - left;Decreased step length -  right;Decreased stride length Gait velocity: Decreased Gait velocity interpretation: 1.31 - 2.62 ft/sec, indicative of limited community ambulator General Gait Details: 3/4 DOE by end of gait training.   Stairs             Wheelchair Mobility    Modified Rankin (Stroke Patients Only) Modified Rankin (Stroke Patients Only) Pre-Morbid Rankin Score: No symptoms Modified Rankin: Moderately severe disability     Balance Overall balance assessment: Needs assistance Sitting-balance support: Feet supported Sitting balance-Leahy Scale: Fair     Standing balance support: No upper extremity supported Standing balance-Leahy Scale: Fair Standing balance comment: static stand without difficulty, with ambulation min G for safety and gait speed improved with walker                 Standardized Balance Assessment Standardized Balance Assessment : Dynamic Gait Index   Dynamic Gait Index Level Surface: Mild Impairment Change in Gait Speed: Mild Impairment Gait with Horizontal Head Turns: Mild Impairment Gait with Vertical Head Turns: Normal Gait and Pivot Turn: Normal Step Over Obstacle: Mild Impairment Step Around Obstacles: Mild Impairment Steps: Mild Impairment Total Score: 18      Cognition Arousal/Alertness: Awake/alert Behavior During Therapy: WFL for tasks assessed/performed Overall Cognitive Status: Within Functional Limits for tasks assessed                                        Exercises      General Comments        Pertinent Vitals/Pain Pain Assessment: No/denies pain  Home Living                      Prior Function            PT Goals (current goals can now be found in the care plan section) Acute Rehab PT Goals Patient Stated Goal: Get back to her garden PT Goal Formulation: With patient Time For Goal Achievement: 02/27/21 Potential to Achieve Goals: Good Progress towards PT goals: Progressing toward goals     Frequency    Min 4X/week      PT Plan Current plan remains appropriate    Co-evaluation              AM-PAC PT "6 Clicks" Mobility   Outcome Measure  Help needed turning from your back to your side while in a flat bed without using bedrails?: None Help needed moving from lying on your back to sitting on the side of a flat bed without using bedrails?: None Help needed moving to and from a bed to a chair (including a wheelchair)?: A Little Help needed standing up from a chair using your arms (e.g., wheelchair or bedside chair)?: A Little Help needed to walk in hospital room?: A Little Help needed climbing 3-5 steps with a railing? : A Little 6 Click Score: 20    End of Session Equipment Utilized During Treatment: Gait belt Activity Tolerance: Patient tolerated treatment well Patient left: in chair;with call bell/phone within reach;with chair alarm set Nurse Communication: Mobility status PT Visit Diagnosis: Difficulty in walking, not elsewhere classified (R26.2);Unsteadiness on feet (R26.81)     Time: 5361-4431 PT Time Calculation (min) (ACUTE ONLY): 26 min  Charges:  $Gait Training: 8-22 mins $Physical Performance Test: 8-22 mins                     Rolinda Roan, PT, DPT Acute Rehabilitation Services Pager: [phone] Office: [place] 02/14/2021, 5:32 PM

## 2021-02-14 NOTE — Progress Notes (Signed)
SLP Cancellation Note  Patient Details Name: Allison [name] MRN: [id number] DOB: 1951-04-29   Cancelled treatment:       Reason Eval/Treat Not Completed: Patient unavailable. Will continue efforts.  Jarell Mcewen B. Quentin Ore, [facility] [place] Speech Language Pathologist Office: [phone] Pager: [phone]  Allison [name] 02/14/2021, 3:18 PM

## 2021-02-14 NOTE — Progress Notes (Signed)
Patient ID: Allison [name], female   DOB: 03/13/1951, 70 y.o.   MRN: [id number]    Progress Note from the Palliative Medicine Team at [facility]   Patient Name: Allison [name]        Date: 02/14/2021 DOB: [birth year]  Age: 70 y.o. MRN#: [id number] Attending Physician: Lavina Hamman, MD Primary Care Physician: Ronnell Freshwater, NP Admit Date: 02/11/2021   Medical records reviewed   70 year old female with past medical history significant for adenocarcinoma of the right lung followed by oncology/Dr. [name] and radiation oncology/Dr. Sondra Come.  She was recently hospitalized with PE and DVT and discharged home on Eliquis.  Less than 24 hours later she woke up with blurry vision and unsteady gait was readmitted back to the hospital.  Work-up significant for bilateral cerebral and cerebellar infarcts likely embolic in nature but cannot rule out metastasis.  Patient faces ongoing treatment option decisions, advanced directive decisions and anticipatory care needs as she navigates this disease trajectory.    This NP visited patient at the bedside as a follow up to  yesterday's [place].   Daughter/[name] at bedside.  Education offered regarding current medical situation.  Patient's supportive family help her in navigating this complex medical situation.  Education offered on utilization of current medications to treat symptoms and enhance comfort.  Education offered on the importance of self advocacy   Plan of Care: -Full Code -Patient is open to all offered and available medical interventions to prolong life.  Stressed importance of continued outpatient follow-ups with her specialists.  I have connected her with upcoming available outpatient palliative services at the cancer center.   Discussed with patient the importance of continued conversation with her family and the medical providers regarding overall plan of care and treatment options,  ensuring decisions are within the context of  the patients values and GOCs.  Questions and concerns addressed   Discussed with Dr Posey Pronto   Total time spent on the unit was 35 minutes  Greater than 50% of the time was spent in counseling and coordination of care  [name] NP  Palliative Medicine Team Team Phone # [phone] Pager [phone]

## 2021-02-15 DIAGNOSIS — I6340 Cerebral infarction due to embolism of unspecified cerebral artery: Secondary | ICD-10-CM | POA: Diagnosis not present

## 2021-02-15 DIAGNOSIS — I2699 Other pulmonary embolism without acute cor pulmonale: Secondary | ICD-10-CM | POA: Diagnosis not present

## 2021-02-15 DIAGNOSIS — I10 Essential (primary) hypertension: Secondary | ICD-10-CM | POA: Diagnosis not present

## 2021-02-15 LAB — CBC
HCT: 34.8 % — ABNORMAL LOW (ref 36.0–46.0)
Hemoglobin: 11.3 g/dL — ABNORMAL LOW (ref 12.0–15.0)
MCH: 26.2 pg (ref 26.0–34.0)
MCHC: 32.5 g/dL (ref 30.0–36.0)
MCV: 80.6 fL (ref 80.0–100.0)
Platelets: 209 10*3/uL (ref 150–400)
RBC: 4.32 MIL/uL (ref 3.87–5.11)
RDW: 15.1 % (ref 11.5–15.5)
WBC: 10 10*3/uL (ref 4.0–10.5)
nRBC: 0 % (ref 0.0–0.2)

## 2021-02-15 LAB — BASIC METABOLIC PANEL
Anion gap: 6 (ref 5–15)
BUN: 20 mg/dL (ref 8–23)
CO2: 27 mmol/L (ref 22–32)
Calcium: 9.2 mg/dL (ref 8.9–10.3)
Chloride: 107 mmol/L (ref 98–111)
Creatinine, Ser: 1.05 mg/dL — ABNORMAL HIGH (ref 0.44–1.00)
GFR, Estimated: 57 mL/min — ABNORMAL LOW (ref >60)
Glucose, Bld: 90 mg/dL (ref 70–99)
Potassium: 3.8 mmol/L (ref 3.5–5.1)
Sodium: 140 mmol/L (ref 135–145)

## 2021-02-15 LAB — QUANTIFERON-TB GOLD PLUS (RQFGPL)
QuantiFERON Mitogen Value: 1.92 IU/mL
QuantiFERON Nil Value: 0.01 IU/mL
QuantiFERON TB1 Ag Value: 0.01 IU/mL
QuantiFERON TB2 Ag Value: 0.01 IU/mL

## 2021-02-15 LAB — APTT: aPTT: 73 seconds — ABNORMAL HIGH (ref 24–36)

## 2021-02-15 LAB — PROCALCITONIN: Procalcitonin: <0.1 ng/mL

## 2021-02-15 LAB — QUANTIFERON-TB GOLD PLUS: QuantiFERON-TB Gold Plus: NEGATIVE

## 2021-02-15 LAB — HEPARIN LEVEL (UNFRACTIONATED): Heparin Unfractionated: 0.75 IU/mL — ABNORMAL HIGH (ref 0.30–0.70)

## 2021-02-15 LAB — MAGNESIUM: Magnesium: 2.2 mg/dL (ref 1.7–2.4)

## 2021-02-15 MED ORDER — ENOXAPARIN (LOVENOX) PATIENT EDUCATION KIT
PACK | Freq: Once | Status: AC
  Filled 2021-02-15: qty 1

## 2021-02-15 MED ORDER — ISOSORBIDE MONONITRATE ER 30 MG PO TB24: 15.0000 mg | ORAL_TABLET | Freq: Every day | ORAL | 0 refills | Status: DC

## 2021-02-15 MED ORDER — AMLODIPINE BESYLATE 5 MG PO TABS
5.0000 mg | ORAL_TABLET | Freq: Every day | ORAL | Status: DC
  Administered 2021-02-15 – 2021-02-16 (×2): 5 mg via ORAL
  Filled 2021-02-15 (×2): qty 1

## 2021-02-15 MED ORDER — PREDNISONE 10 MG PO TABS: ORAL_TABLET | ORAL | 0 refills | Status: DC

## 2021-02-15 MED ORDER — ENOXAPARIN SODIUM 60 MG/0.6ML IJ SOSY
60.0000 mg | PREFILLED_SYRINGE | Freq: Two times a day (BID) | INTRAMUSCULAR | Status: DC
  Administered 2021-02-15 – 2021-02-16 (×2): 60 mg via SUBCUTANEOUS
  Filled 2021-02-15 (×2): qty 0.6

## 2021-02-15 MED ORDER — ENOXAPARIN SODIUM 60 MG/0.6ML IJ SOSY
60.0000 mg | PREFILLED_SYRINGE | Freq: Two times a day (BID) | INTRAMUSCULAR | 0 refills | Status: DC
Start: 2021-02-16 — End: 2021-02-16

## 2021-02-15 MED ORDER — ISOSORBIDE MONONITRATE ER 30 MG PO TB24
15.0000 mg | ORAL_TABLET | Freq: Every day | ORAL | Status: DC
  Administered 2021-02-15: 15 mg via ORAL
  Filled 2021-02-15: qty 1

## 2021-02-15 MED ORDER — HYDROCODONE BIT-HOMATROP MBR 5-1.5 MG/5ML PO SOLN: 5.0000 mL | ORAL | 0 refills | Status: DC | PRN

## 2021-02-15 MED ORDER — MENTHOL 3 MG MT LOZG: 1.0000 | LOZENGE | OROMUCOSAL | 12 refills | Status: DC | PRN

## 2021-02-15 MED ORDER — MOMETASONE FURO-FORMOTEROL FUM 100-5 MCG/ACT IN AERO: 2.0000 | INHALATION_SPRAY | Freq: Two times a day (BID) | RESPIRATORY_TRACT | 0 refills | Status: DC

## 2021-02-15 MED ORDER — GUAIFENESIN-DM 100-10 MG/5ML PO SYRP
10.0000 mL | ORAL_SOLUTION | Freq: Four times a day (QID) | ORAL | 0 refills | Status: DC
Start: 2021-02-15 — End: 2021-04-26

## 2021-02-15 NOTE — Progress Notes (Signed)
Progress Note  Patient Name: Allison [name] Date of Encounter: 02/15/2021  [facility] Cardiologist: Donato Heinz, MD   Subjective   Reports chest pain improved from yesterday but continues to have pain that she describes as tightness, worse with deep breathing or coughing  Inpatient Medications    Scheduled Meds:  aspirin  81 mg Oral Daily   carvedilol  6.25 mg Oral BID WC   dorzolamide-timolol  1 drop Both Eyes BID   ezetimibe  10 mg Oral Daily   guaiFENesin-dextromethorphan  10 mL Oral QID   latanoprost  1 drop Both Eyes QHS   loratadine  10 mg Oral Daily   mometasone-formoterol  2 puff Inhalation BID   nitroGLYCERIN  0.5 inch Topical Q6H   pantoprazole sodium  40 mg Oral Daily   predniSONE  60 mg Oral Q breakfast   Continuous Infusions:  heparin 900 Units/hr (02/15/21 0129)   PRN Meds: acetaminophen **OR** acetaminophen (TYLENOL) oral liquid 160 mg/5 mL **OR** acetaminophen, albuterol, ALPRAZolam, HYDROcodone bit-homatropine, menthol-cetylpyridinium, morphine injection, senna-docusate   Vital Signs    Vitals:   02/14/21 2025 02/15/21 0043 02/15/21 0459 02/15/21 0753  BP: (!) 155/87 (!) 141/72 (!) 179/94 (!) 168/81  Pulse: 79 65 100 87  Resp: 18 18 20 18   Temp: 98.2 F (36.8 C) 98 F (36.7 C) 97.8 F (36.6 C) 98.4 F (36.9 C)  TempSrc: Oral Oral Oral Oral  SpO2: 100% 99% 98% 98%  Weight:      Height:        Intake/Output Summary (Last 24 hours) at 02/15/2021 1003 Last data filed at 02/15/2021 0900 Gross per 24 hour  Intake 649.77 ml  Output --  Net 649.77 ml    Last 3 Weights 02/13/2021 02/07/2021 02/07/2021  Weight (lbs) 129 lb 10.1 oz 127 lb 127 lb 1.6 oz  Weight (kg) 58.8 kg 57.607 kg 57.652 kg      Telemetry    Normal sinus rhythm, PVCs, rate 70s to 100s.- Personally Reviewed  ECG    Normal sinus rhythm, rate 80, no ST abnormalities personally Reviewed  Physical Exam   GEN: No acute distress.   Neck: No JVD Cardiac: RRR, no  murmurs, rubs, or gallops.  Respiratory: Scattered crackles GI: Soft, nontender, non-distended  MS: No edema; No deformity. Neuro:  Nonfocal  Psych: Normal affect   Labs    High Sensitivity Troponin:   Recent Labs  Lab 02/07/21 2314 02/08/21 0154 02/13/21 1141 02/13/21 1425 02/14/21 0923  TROPONINIHS 184* 149* 1,008* 1,013* 903*       Chemistry Recent Labs  Lab 02/11/21 1811 02/12/21 1040 02/13/21 0417 02/14/21 0113 02/15/21 0731  NA 139 137 138 138 140  K 2.8* 2.9* 4.7 4.5 3.8  CL 99 106 105 104 107  CO2 28 25 26 26 27   GLUCOSE 239* 102* 139* 136* 90  BUN 19 12 10  24* 20  CREATININE 1.02* 0.76 0.96 1.11* 1.05*  CALCIUM 9.5 8.0* 9.2 9.2 9.2  PROT 7.0 5.7* 6.3*  --   --   ALBUMIN 3.5 2.8* 3.0*  --   --   AST 39 29 36  --   --   ALT 43 31 46*  --   --   ALKPHOS 58 46 53  --   --   BILITOT 0.3 0.5 0.6  --   --   GFRNONAA 59* >60 >60 53* 57*  ANIONGAP 12 6 7 8 6       Hematology Recent Labs  Lab 02/13/21 0417 02/14/21 0113 02/15/21 0731  WBC 6.9 11.6* 10.0  RBC 4.66 3.95 4.32  HGB 12.1 10.5* 11.3*  HCT 37.1 31.7* 34.8*  MCV 79.6* 80.3 80.6  MCH 26.0 26.6 26.2  MCHC 32.6 33.1 32.5  RDW 14.8 14.9 15.1  PLT 158 165 209     BNP No results for input(s): BNP, PROBNP in the last 168 hours.    DDimer No results for input(s): DDIMER in the last 168 hours.   Radiology    DG Chest 2 View  Result Date: 02/13/2021 CLINICAL DATA:  Shortness of breath for several weeks. History of lung cancer. EXAM: CHEST - 2 VIEW COMPARISON:  Chest x-ray dated 02/07/2021. Chest CT dated 02/07/2021. FINDINGS: Heart size and mediastinal contours are stable. Dense opacity within the RIGHT lower lung is unchanged, corresponding to the mix of consolidations and pleural effusion better demonstrated on earlier CT. Additional mild interstitial prominence throughout the RIGHT upper lobe and LEFT lung is stable. Spiculated mass within the RIGHT lung apex is better demonstrated on  earlier chest CT. IMPRESSION: 1. Stable chest x-ray. 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 2. Interstitial prominence bilaterally, suspicious for diffuse lymphangitic spread of malignancy. 3. Spiculated mass within the RIGHT lung apex is better demonstrated on earlier chest CT of 02/07/2021. Electronically Signed   By: Franki Cabot M.D.   On: 02/13/2021 15:25   ECHOCARDIOGRAM LIMITED  Result Date: 02/13/2021    ECHOCARDIOGRAM LIMITED REPORT   Patient Name:   Allison [name] Date of Exam: 02/13/2021 Medical Rec #:  [id number]       Height:       64.0 in Accession #:    [id number]      Weight:       127.0 lb Date of Birth:  1950-10-14       BSA:          1.613 m Patient Age:    6 years        BP:           138/84 mmHg Patient Gender: F               HR:           84 bpm. Exam Location:  Inpatient Procedure: Limited Echo and Color Doppler Indications:    Elevated troponin  History:        Patient has prior history of Echocardiogram examinations, most                 recent 02/08/2021. CAD. H/o DVT and pulmonary embolus.  Sonographer:    Clayton Lefort RDCS (AE) Referring Phys: [id number] [place]  1. Left ventricular ejection fraction, by estimation, is 60 to 65%. The left ventricle has normal function. The left ventricle has no regional wall motion abnormalities.  2. The mitral valve is normal in structure. Mild to moderate mitral valve regurgitation. Comparison(s): No significant change from prior study. Conclusion(s)/Recommendation(s): Limited echo for wall motion/EF, both normal and unchanged from echo 02/08/21. FINDINGS  Left Ventricle: Left ventricular ejection fraction, by estimation, is 60 to 65%. The left ventricle has normal function. The left ventricle  has no regional wall motion abnormalities. The left ventricular internal cavity size was normal in size. Mitral Valve: The mitral valve is normal in  structure. Mild to moderate mitral valve regurgitation. Buford Dresser MD Electronically signed by Buford Dresser MD Signature Date/Time: 02/13/2021/6:39:03 PM    Final     Cardiac Studies   Echo 02/13/2021:   1. Left ventricular ejection fraction, by estimation, is 60 to 65%. The  left ventricle has normal function. The left ventricle has no regional  wall motion abnormalities.   2. The mitral valve is normal in structure. Mild to moderate mitral valve  regurgitation.   Patient Profile     70 y.o. female with a hx of CAD status post DES to RCA 06/16/19, stage IV lung cancer, hypertension, hyperlipidemia, recent DVT/PE who presents with acute CVA and troponin elevation  Assessment & Plan    Elevated troponin: Troponin 1008 > 1013> 903.  Echo shows no new wall motion abnormality.  Suspect demand ischemia in setting of acute CVA and recent PE -In setting of acute CVA and considering no new WMA on echo and flat troponin, no plan for ischemic evaluation at this time. -Continues to have chest pain.  Worse with deep breathing or coughing.  Given continuous pain with downtrending troponins, suspect noncardiac.  Would stop nitropaste, can try low dose imdur  PE: Was on Eliquis prior to discharge.  Currently on IV heparin.  Agree with discussing with hematology best anticoagulation moving forward.  CAD: Status post drug-eluting stent to RCA on 06/16/2019.   -Continue aspirin 81 mg daily.   -Continue Coreg 6.25 mg twice daily -Has had issues starting statins due to transaminitis and myalgias.  Would continue Praluent   Hyperlipidemia: LDL 91 on 02/12/2021.  Continue Praluent   Hypertension: On carvedilol 6.25 mg twice daily.  BP has been elevated, if neuro does not want permissive hypertension in setting of stroke, would restart home  amlodipine and hydrochlorothiazide   Stage IV lung cancer: Followed by oncology   For questions or updates, please contact [place] Please consult [email or web address] for contact info under        Signed, Donato Heinz, MD  02/15/2021, 10:03 AM

## 2021-02-15 NOTE — TOC Benefit Eligibility Note (Signed)
Transition of Care [facility]) Benefit Eligibility Note    Patient Details  Name: INAARA TYE MRN: [id number] Date of Birth: [birth year]   Medication/Dose: ENOXAPARIN 60 MG  BID SYRINGES  Covered?: Yes  Tier: 2 Drug  Prescription Coverage Preferred Pharmacy: [facility]  Spoke with Person/Company/Phone Number:: [employer]  @  [employer]  [employer] RX # [phone] OPT- MEMBER  Co-Pay: $ 115.57  Prior Approval: No  Deductible: Met (OUT-OF-POCKET:UNMET)  Additional Notes: LOVENOX 60 MG BID  [place] : NOT COVER , P/A-YES #  [id number]  & FAX # [phone]    Memory Argue Phone Number: 02/15/2021, 12:23 PM

## 2021-02-15 NOTE — Progress Notes (Signed)
Lovenox education completed. Patient successfully demonstrated self administration.

## 2021-02-15 NOTE — Progress Notes (Signed)
  Speech Language Pathology Treatment:    Patient Details Name: Allison [name] MRN: [id number] DOB: 03/18/1951 Today's Date: 02/15/2021 Time:  -     Pt screened at bedside. No needs identified. She does become dyspneic and during moving and speaking per pt since lung cancer diagnosis in May. Denied cough related to food              Houston Siren 02/15/2021, 10:09 AM

## 2021-02-15 NOTE — Progress Notes (Signed)
This chaplain responded to PMT consult for pastoral presence.  The Pt. is sitting up in the bedside recliner and welcomes the chaplain's visit.  The chaplain understands the Pt. medical situation often leaves the Pt. feeling overwhelmed. The Pt. is appreciative of the chaplain's reflective listening as the Pt. shares and sorts her [employer] [place] religious benefits and how these beliefs add meaning on the Pt. medical path.  The chaplain understands the Pt. primary source of support is her family.  The Pt. adds the cancer group at [facility] and selected friends as secondary source of support.  The Pt. identifies with not wanting to burden anyone with her illness and giving herself "mercy" to experience God's "everlasting love". The chaplain will support PMT recommendation of exploring community resources and outpatient Palliative Care services.  The Pt. accepted the Pt. invitation for prayer and F/U spiritual care.

## 2021-02-15 NOTE — Progress Notes (Signed)
Occupational Therapy Treatment Patient Details Name: Allison [name] MRN: [id number] DOB: February 03, 1951 Today's Date: 02/15/2021    History of present illness Patient is a 70 year old female presenting to ED with blurry vision gait deviations, listing to L. MRI reveal Patchy multifocal acute to subacute ischemic infarcts involving  the bilateral cerebral and cerebellar hemispheres. Patient recently D/C from hospital after treatment for PNA, acute PT and DVT. PMH includes lung CA, CAD, MI, glaucoma   OT comments  Pt making steady progress towards OT goals this session. Session focus on functional mobility, BADL reeducation and visual deficits. Pt continues to present with decreased activity tolerance, generalized deconditioning and visual deficits ( see vision section). Pt currently requires min guard for functional with no AD greater than a household distance, pt completed toileting with min guard and UB grooming tasks at sink with supervision. Pt would continue to benefit from skilled occupational therapy while admitted and after d/c to address the below listed limitations in order to improve overall functional mobility and facilitate independence with BADL participation. DC plan remains appropriate, will follow acutely per POC.     Follow Up Recommendations  Other (comment) (neuro-ophthalmologist assessment)    Equipment Recommendations  Other (comment) (rolling walker)    Recommendations for Other Services      Precautions / Restrictions Precautions Precautions: Fall Restrictions Weight Bearing Restrictions: No       Mobility Bed Mobility               General bed mobility comments: OOB in recliner    Transfers Overall transfer level: Needs assistance Equipment used: None Transfers: Sit to/from Stand Sit to Stand: Supervision         General transfer comment: Close supervision for safety. No assist required.    Balance Overall balance assessment: Needs  assistance Sitting-balance support: Feet supported Sitting balance-Leahy Scale: Fair     Standing balance support: No upper extremity supported Standing balance-Leahy Scale: Fair Standing balance comment: standing at sink dynamically during ADLs with no LOB, pt able to step over obstacles with no AD with BLEs with no LOB.                            ADL either performed or assessed with clinical judgement   ADL Overall ADL's : Needs assistance/impaired     Grooming: Wash/dry hands;Standing;Supervision/safety Grooming Details (indicate cue type and reason): standing at sink                 Toilet Transfer: Min guard;Ambulation;Regular Glass blower/designer Details (indicate cue type and reason): min guard for safety and to manage IV Toileting- Clothing Manipulation and Hygiene: Supervision/safety;Sit to/from stand       Functional mobility during ADLs: Min guard;Rolling walker General ADL Comments: pt continues to present with decreased activity tolerance, visual deficits and impaired balance     Vision Baseline Vision/History: 3 Glaucoma;4 Cataracts Ability to See in Adequate Light: 0 Adequate Patient Visual Report: Central vision impairment;Blurring of vision;Other (comment) (pt reprots double vision from far away only centrally, blurry vision on L side) Additional Comments: dysmetria improved in comparision to previous session. pt reports double vision centrally that does not improve when one eye occluded, suspect L inattention based on line bisection screening as pt noted to need to turn head to R to fully bisect line down middle   Perception     Praxis      Cognition Arousal/Alertness: Awake/alert Behavior  During Therapy: WFL for tasks assessed/performed Overall Cognitive Status: Within Functional Limits for tasks assessed                                          Exercises     Shoulder Instructions       General Comments HR WFL  on RA    Pertinent Vitals/ Pain       Pain Assessment: No/denies pain  Home Living                                          Prior Functioning/Environment              Frequency  Min 2X/week        Progress Toward Goals  OT Goals(current goals can now be found in the care plan section)  Progress towards OT goals: Progressing toward goals  Acute Rehab OT Goals Patient Stated Goal: to improve her vision OT Goal Formulation: With patient Time For Goal Achievement: 02/26/21 Potential to Achieve Goals: Good  Plan Discharge plan remains appropriate;Frequency remains appropriate    Co-evaluation                 AM-PAC OT "6 Clicks" Daily Activity     Outcome Measure   Help from another person eating meals?: None Help from another person taking care of personal grooming?: None Help from another person toileting, which includes using toliet, bedpan, or urinal?: A Little Help from another person bathing (including washing, rinsing, drying)?: A Little Help from another person to put on and taking off regular upper body clothing?: None Help from another person to put on and taking off regular lower body clothing?: A Little 6 Click Score: 21    End of Session Equipment Utilized During Treatment: Gait belt  OT Visit Diagnosis: Unsteadiness on feet (R26.81)   Activity Tolerance Patient tolerated treatment well   Patient Left in chair;with call bell/phone within reach;with chair alarm set;with family/visitor present   Nurse Communication Mobility status        Time: 9629-5284 OT Time Calculation (min): 41 min  Charges: OT General Charges $OT Visit: 1 Visit OT Treatments $Self Care/Home Management : 38-52 mins  [name]., COTA/L Acute Rehabilitation Services [phone]    [name] 02/15/2021, 12:00 PM

## 2021-02-15 NOTE — TOC Initial Note (Signed)
Transition of Care [facility]) - Initial/Assessment Note    Patient Details  Name: Allison [name] MRN: [id number] Date of Birth: [birth year]  Transition of Care [facility]) CM/SW Contact:    Pollie Friar, RN Phone Number: 02/15/2021, 2:54 PM  Clinical Narrative:                 Patient is from home alone. Currently her daughter is here from [place] staying with her and plans to stay until her mother is ready to be alone. Pt also has a friend that checks on her and can provide needed transportation. Pt denies issues with meds other than the cost of Eliquis was going to be high for her.  Recommendations are for outpatient therapy. She asked to attend at [facility]. Orders in Epic and information is on the AVS.  Pt states she can afford the $115/ month for the lovenox if not long term. She would need assistance if she is going to be on it long term. TOC following.  Expected Discharge Plan: OP Rehab Barriers to Discharge: Continued Medical Work up   Patient Goals and CMS Choice   CMS Medicare.gov Compare Post Acute Care list provided to:: Patient Choice offered to / list presented to : Patient, Adult Children  Expected Discharge Plan and Services Expected Discharge Plan: OP Rehab   Discharge Planning Services: CM Consult   Living arrangements for the past 2 months: Single Family Home                                      Prior Living Arrangements/Services Living arrangements for the past 2 months: Single Family Home Lives with:: Self Patient language and need for interpreter reviewed:: Yes Do you feel safe going back to the place where you live?: Yes      Need for Family Participation in Patient Care: Yes (Comment) Care giver support system in place?: Yes (comment) (daughter is going to stay with her until she is able to be alone)   Criminal Activity/Legal Involvement Pertinent to Current Situation/Hospitalization: No - Comment as needed  Activities of Daily  Living Home Assistive Devices/Equipment: None ADL Screening (condition at time of admission) Patient's cognitive ability adequate to safely complete daily activities?: Yes Is the patient deaf or have difficulty hearing?: No Does the patient have difficulty seeing, even when wearing glasses/contacts?: No Does the patient have difficulty concentrating, remembering, or making decisions?: No Patient able to express need for assistance with ADLs?: Yes Does the patient have difficulty dressing or bathing?: Yes Independently performs ADLs?: No Does the patient have difficulty walking or climbing stairs?: Yes Weakness of Legs: Both Weakness of Arms/Hands: Both  Permission Sought/Granted                  Emotional Assessment Appearance:: Appears stated age Attitude/Demeanor/Rapport: Engaged Affect (typically observed): Accepting Orientation: : Oriented to Self, Oriented to Place, Oriented to  Time, Oriented to Situation   Psych Involvement: No (comment)  Admission diagnosis:  CVA (cerebral vascular accident) [facility]) [I63.9] Cerebrovascular accident (CVA), unspecified mechanism ([place]) [I63.9] Patient Active Problem List   Diagnosis Date Noted   CVA (cerebral vascular accident) ([place]) 02/12/2021   Current use of long term anticoagulation 02/12/2021   Hypokalemia 02/12/2021   Leg DVT (deep venous thromboembolism), acute, bilateral ([place]) 02/08/2021   Aortic atherosclerosis ([place]) 02/08/2021   Respiratory failure ([place]) 02/08/2021   Postobstructive pneumonia 02/07/2021  Pulmonary embolism ([place]) 02/07/2021   Elevated troponin 02/07/2021   Encounter to establish care 02/02/2021   Shortness of breath 02/02/2021   Cough 02/02/2021   Statin myopathy 12/03/2020   Acute non-recurrent frontal sinusitis 11/18/2020   NSCLC with EGFR mutation ([place]) 06/30/2020   Hx of chest tube placement 02/11/2020   History of chest tube placement 11/12/2019   Abnormal liver function 08/20/2019    Gastroesophageal reflux disease without esophagitis 08/20/2019   Generalized anxiety disorder 08/20/2019   Glaucoma 08/20/2019   Hyperlipidemia 08/20/2019   Menopausal syndrome 08/20/2019   Raynaud's phenomenon 08/20/2019   Vitamin D deficiency 08/20/2019   Status post coronary artery stent placement    History of non-ST elevation myocardial infarction (NSTEMI) 06/12/2019   Hypertension 03/24/2019   Encounter for antineoplastic chemotherapy 01/22/2018   Adenocarcinoma of right lung, stage 4 ([place]) 01/04/2018   Goals of care, counseling/discussion 01/04/2018   Thyroid nodule 12/11/2017   Pleural effusion, right 12/11/2017   Insomnia 04/20/2014   PCP:  Ronnell Freshwater, NP Pharmacy:   [id number] [place], [id number]: [phone]: [address] [address] [place]: [phone]: [phone]    Social Determinants of Health (SDOH) Interventions    Readmission Risk Interventions No flowsheet data found.

## 2021-02-15 NOTE — Progress Notes (Signed)
Physical Therapy Treatment Patient Details Name: Allison [name] MRN: [id number] DOB: Mar 01, 1951 Today's Date: 02/15/2021    History of Present Illness Patient is a 70 year old female presenting to ED with blurry vision gait deviations, listing to L. MRI reveal Patchy multifocal acute to subacute ischemic infarcts involving  the bilateral cerebral and cerebellar hemispheres. Patient recently D/C from hospital after treatment for PNA, acute PT and DVT. PMH includes lung CA, CAD, MI, glaucoma    PT Comments    Pt received in chair, motivated to participate in therapy session and with good tolerance for gait and stair training using cane. Pt reports feeling more stable with assistive device for community distance gait trial and would benefit from cane upon discharge; DME recommendations updated below to reflect supervising therapist recommendation from previous session. Pt continues to benefit from PT services to progress toward functional mobility goals. Pt notably less dyspneic this session and with improved activity tolerance, reporting 4/10 modified RPE (Fatigue).  Follow Up Recommendations  Outpatient PT;Supervision for mobility/OOB (neurorehab for strength and balance training)     Equipment Recommendations  Cane    Recommendations for Other Services       Precautions / Restrictions Precautions Precautions: Fall Precaution Comments: L inattention/blurry vision Restrictions Weight Bearing Restrictions: No    Mobility  Bed Mobility               General bed mobility comments: OOB in recliner    Transfers Overall transfer level: Needs assistance Equipment used: None Transfers: Sit to/from Stand Sit to Stand: Supervision         General transfer comment: Close supervision for safety. No assist required.  Ambulation/Gait Ambulation/Gait assistance: Supervision Gait Distance (Feet): 300 Feet Assistive device: Straight cane Gait Pattern/deviations: Step-through  pattern;Decreased step length - left;Decreased step length - right;Decreased stride length     General Gait Details: 1/4 DOE with gait, SpO2 98-99% and HR WFL   Stairs Stairs: Yes Stairs assistance: Supervision Stair Management: One rail Left;With cane;Step to pattern;Forwards Number of Stairs: 5 General stair comments: pt instructed on stair sequencing, pt wanting to trial stairs with rail/cane as she may get cane for home, pt needs reinforcement for sequencing would benefit from handout prior to discharge   Wheelchair Mobility    Modified Rankin (Stroke Patients Only)       Balance Overall balance assessment: Needs assistance Sitting-balance support: Feet supported Sitting balance-Leahy Scale: Fair     Standing balance support: No upper extremity supported Standing balance-Leahy Scale: Fair Standing balance comment: pt able to ambulate with min guard/supervision but "feels steadier" using cane                            Cognition Arousal/Alertness: Awake/alert Behavior During Therapy: WFL for tasks assessed/performed Overall Cognitive Status: Within Functional Limits for tasks assessed                                        Exercises Other Exercises Other Exercises: IS x 5 max inhalation 1200 mL    General Comments General comments (skin integrity, edema, etc.): VSS on RA      Pertinent Vitals/Pain Pain Assessment: No/denies pain    Home Living                      Prior Function  PT Goals (current goals can now be found in the care plan section) Acute Rehab PT Goals Patient Stated Goal: to improve her vision and balance PT Goal Formulation: With patient Time For Goal Achievement: 02/27/21 Potential to Achieve Goals: Good Progress towards PT goals: Progressing toward goals    Frequency    Min 4X/week      PT Plan Current plan remains appropriate       AM-PAC PT "6 Clicks" Mobility   Outcome  Measure  Help needed turning from your back to your side while in a flat bed without using bedrails?: None Help needed moving from lying on your back to sitting on the side of a flat bed without using bedrails?: None Help needed moving to and from a bed to a chair (including a wheelchair)?: A Little Help needed standing up from a chair using your arms (e.g., wheelchair or bedside chair)?: A Little Help needed to walk in hospital room?: A Little Help needed climbing 3-5 steps with a railing? : A Little 6 Click Score: 20    End of Session Equipment Utilized During Treatment: Gait belt Activity Tolerance: Patient tolerated treatment well Patient left: in chair;with call bell/phone within reach;with chair alarm set Nurse Communication: Mobility status PT Visit Diagnosis: Difficulty in walking, not elsewhere classified (R26.2);Unsteadiness on feet (R26.81)     Time: 1635-1700 PT Time Calculation (min) (ACUTE ONLY): 25 min  Charges:  $Gait Training: 8-22 mins $Therapeutic Activity: 8-22 mins                     Domique Reardon P., PTA Acute Rehabilitation Services Pager: [phone] Office: [place] 02/15/2021, 6:23 PM

## 2021-02-15 NOTE — Progress Notes (Signed)
[facility] Progress Note  Patient: Allison [name]    EPP:[id number]  DOA: 02/11/2021     Date of Service: the patient was seen and examined on 02/15/2021  Brief hospital course: Past medical history of HTN, adenocarcinoma of right lung, history of chemotherapy and radiation, recent PE DVT, CAD.  Presents with complaints of dizziness and abnormal gait.  Found to have acute CVA.  Initially admitted requested hospital.  [facility] for further evaluation. Neurology consulted. Had some chest pain and therefore cardiology was consulted due to positive troponins. Currently plan is ensure Lovenox education is provided and patient is able to perform injections.  Likely can go home tomorrow morning.  Subjective: Pain improving.  Breathing improving.  No nausea no vomiting but no fever no chills.  No bleeding.  Assessment and Plan: 1.  Bilateral cerebral and cerebellar infarcts likely embolic in nature in the setting of hypercoagulable state due to stage IV lung cancer. CT head read cellular hyperdensity. CTA questions occlusion of left vertebral artery at skull base MRI shows patchy multifocal acute to subacute and ischemic infarct in bilateral cerebral and cerebellar hemisphere. Neurology consulted. Currently on IV heparin. Patient was on Eliquis prior to admission.   Discussed with Dr. Julien Nordmann.  Recommend Lovenox as recommended by neurology as well. Repeat MRI in 3 to 4 weeks recommended to rule out metastasis although appearance is more likely consistent with stroke.  PT recommends home therapy.  2.  Chest pain History of CAD Elevated troponin Patient reports ongoing chest pain which appears to be more associated with her malignancy and radiation history. EKG unremarkable for any acute ischemic events. Troponins were elevated although delta troponin not significantly elevated. Patient had similar elevated troponin during last admission with [place] to develop. Repeat  echocardiogram shows preserved EF without any wall Motion abnormality.  Repeat troponin negative Despite of ongoing chest pain and EKG unremarkable for any acute ischemia as well. Cardiology consulted.  Negative stress test in October 2021.  PCI in December 22 for angina. Currently on IV heparin.  No ischemic work-up for now.  Continue Repatha on discharge. Continue aspirin as well. Continue Lovenox on discharge.  3.  Pneumonitis likely secondary to immunotherapy Recently admitted for this in the hospital. Pneumonia ruled out based on the work-up. Patient was treated with 3 days of IV antibiotics.  Recommendation was to continue antibiotic on discharge although has not received antibiotics since admission.  At present does not appear to have any pneumonia and Keflex will not be initiated. Patient is on prednisone prior to admission.  I would recommend continuing prednisone for another week before tapering it off. Repeat chest x-ray shows no acute abnormality.  4.  Adenocarcinoma of the lung, stage IV Outpatient immunotherapy currently on hold. On oral prednisone. Management per oncology outpatient. Prognosis guarded in the setting of stage IV malignancy which appears to be progressive with lymphangitic spread.  5.  Dyspnea Dry cough Symptomatology likely associated with her malignancy rather than any other etiology. Patient had tried multiple medications for dry cough and has not been effective.  Patient had concerns regarding opioids. Currently agreeable to try opioids for cough suppression as needed. Will monitor. Dyspnea appears to be intermittent and the patient remains on room air throughout the conversation with adequate saturation. At present suspect this is combination of frequent cough and attempt to suppress it as well as pneumonitis, malignancy, pleuritic chest pain as well as partial anxiety causing her dyspnea. Monitor.  6.  PE On Eliquis. Currently on IV  heparin. Discussed with oncology.  Transition to Lovenox twice daily.  Would not initiate daily dosing for now.  7.  Recent pneumonitis. Chemotherapy-induced. Discussed with oncology.  Would prefer weekly taper.  Currently ordered.  Hypokalemia.  Corrected. Hypophosphatemia.  Corrected.   Scheduled Meds:  amLODipine  5 mg Oral Daily   aspirin  81 mg Oral Daily   carvedilol  6.25 mg Oral BID WC   dorzolamide-timolol  1 drop Both Eyes BID   enoxaparin (LOVENOX) injection  60 mg Subcutaneous Q12H   ezetimibe  10 mg Oral Daily   guaiFENesin-dextromethorphan  10 mL Oral QID   isosorbide mononitrate  15 mg Oral Daily   latanoprost  1 drop Both Eyes QHS   loratadine  10 mg Oral Daily   mometasone-formoterol  2 puff Inhalation BID   pantoprazole sodium  40 mg Oral Daily   predniSONE  60 mg Oral Q breakfast   Continuous Infusions:   PRN Meds: acetaminophen **OR** acetaminophen (TYLENOL) oral liquid 160 mg/5 mL **OR** acetaminophen, albuterol, ALPRAZolam, HYDROcodone bit-homatropine, menthol-cetylpyridinium, morphine injection, senna-docusate  Body mass index is 22.25 kg/m.        DVT Prophylaxis: Therapeutic anticoagulation with heparin   Advance goals of care discussion: Pt is Full code.  Family Communication: no family was present at bedside, at the time of interview.  Discussed with daughter on the phone.  Data Reviewed: I have personally reviewed and interpreted daily labs, tele strips, imaging. Electrolytes stable.  Serum creatinine stable.  BUN improving.  Hemoglobin stable.  Physical Exam:  General: Appear in mild distress, no Rash; Oral Mucosa Clear, moist. no Abnormal Neck Mass Or lumps, Conjunctiva normal  Cardiovascular: S1 and S2 Present, no Murmur, Respiratory: good respiratory effort, Bilateral Air entry present and faint Crackles, no wheezes Abdomen: Bowel Sound present, Soft and no tenderness Extremities: no Pedal edema Neurology: alert and oriented to  time, place, and person affect appropriate. no new focal deficit Gait not checked due to patient safety concerns   Vitals:   02/15/21 0459 02/15/21 0753 02/15/21 1156 02/15/21 1548  BP: (!) 179/94 (!) 168/81 (!) 152/76 (!) 141/87  Pulse: 100 87 77 75  Resp: 20 18 20 18   Temp: 97.8 F (36.6 C) 98.4 F (36.9 C) 98.4 F (36.9 C) 98.3 F (36.8 C)  TempSrc: Oral Oral Oral Oral  SpO2: 98% 98% 100% 99%  Weight:      Height:        Disposition:  Status is: Inpatient  Remains inpatient appropriate because:IV treatments appropriate due to intensity of illness or inability to take PO  Dispo: The patient is from: Home              Anticipated d/c is to: Home              Patient currently is not medically stable to d/c.   Difficult to place patient No  Time spent: 35 minutes. I reviewed all nursing notes, pharmacy notes, vitals, pertinent old records. I have discussed plan of care as described above with RN.  Author: Berle Mull, MD [facility] 02/15/2021 7:08 PM  To reach On-call, see care teams to locate the attending and reach out via [email or web address]. Between 7PM-7AM, please contact night-coverage If you still have difficulty reaching the attending provider, please page the [facility] (Director on Call) for [facility] on amion for assistance.

## 2021-02-15 NOTE — Progress Notes (Addendum)
[place] for Heparin >> Lovenox Indication: recent pulmonary embolus on apixaban, new embolic stroke  Patient Measurements: Height: 5\' 4"  (162.6 cm) Weight: 58.8 kg (129 lb 10.1 oz) IBW/kg (Calculated) : 54.7 Heparin Dosing Weight: 58 kg  Vital Signs: Temp: 98.4 F (36.9 C) (08/30 0753) Temp Source: Oral (08/30 0753) BP: 168/81 (08/30 0753) Pulse Rate: 87 (08/30 0753)  Labs: Recent Labs    02/13/21 0417 02/13/21 1141 02/13/21 1425 02/14/21 0113 02/14/21 0923 02/14/21 1152 02/14/21 2243 02/15/21 0731  HGB 12.1  --   --  10.5*  --   --   --  11.3*  HCT 37.1  --   --  31.7*  --   --   --  34.8*  PLT 158  --   --  165  --   --   --  209  APTT 44* 48*  --  59* 82*  --  103* 73*  HEPARINUNFRC >1.10* >1.10*  --  >1.10*  --  >1.10*  --  0.75*  CREATININE 0.96  --   --  1.11*  --   --   --  1.05*  TROPONINIHS  --  1,008* 1,013*  --  903*  --   --   --      Estimated Creatinine Clearance: 43.1 mL/min (A) (by C-G formula based on SCr of 1.05 mg/dL (H)).   Plan:  70 y/o F with a h/o lung cancer discharged on 8/25 with Eliquis for PE. Patient was admitted with neurologic symptoms and found to have multifocal acute and subacute ischemic infarcts. Pharmacy consulted to dose and monitor transition to heparin infusion with plans to transition again to Lovenox on 8/30 PM.  aPTT therapeutic this morning after a recent rate decrease (aPTT 73, goal of 66-85). Heparin level still falsely elevated with recent Apixaban use. CBC stable this morning. No bleeding noted.   Will continue the heparin rate at the current rate until able to start lovenox this evening. Discussed transition plans with RN Minette Brine)  Goal of Therapy:  Anti-Xa level 0.6-1 units/ml 4hrs after LMWH dose given Monitor platelets by anticoagulation protocol: Yes   Plan:  - Continue Heparin at 900 units/hr (ml/hr) - stop at 8/30 @ 1759 - Start Lovenox 60 mg SQ every 12 hours starting  at 1800 - Will continue to monitor for any signs/symptoms of bleeding and renal function for dose adjustments   Thank you for allowing pharmacy to be a part of this patient's care.  Alycia Rossetti, PharmD, BCPS Clinical Pharmacist Clinical phone for 02/15/2021: [phone] 02/15/2021 9:50 AM   **Pharmacist phone directory can now be found on [email or web address] ([email or web address]).  Listed under [place].

## 2021-02-16 ENCOUNTER — Inpatient Hospital Stay: Payer: Medicare HMO | Admitting: Nurse Practitioner

## 2021-02-16 ENCOUNTER — Other Ambulatory Visit (HOSPITAL_COMMUNITY): Payer: Self-pay

## 2021-02-16 DIAGNOSIS — R778 Other specified abnormalities of plasma proteins: Secondary | ICD-10-CM | POA: Diagnosis not present

## 2021-02-16 DIAGNOSIS — I25118 Atherosclerotic heart disease of native coronary artery with other forms of angina pectoris: Secondary | ICD-10-CM | POA: Diagnosis not present

## 2021-02-16 DIAGNOSIS — C3491 Malignant neoplasm of unspecified part of right bronchus or lung: Secondary | ICD-10-CM | POA: Diagnosis not present

## 2021-02-16 DIAGNOSIS — I6340 Cerebral infarction due to embolism of unspecified cerebral artery: Secondary | ICD-10-CM | POA: Diagnosis not present

## 2021-02-16 DIAGNOSIS — I10 Essential (primary) hypertension: Secondary | ICD-10-CM | POA: Diagnosis not present

## 2021-02-16 DIAGNOSIS — I639 Cerebral infarction, unspecified: Secondary | ICD-10-CM | POA: Diagnosis not present

## 2021-02-16 LAB — BASIC METABOLIC PANEL
Anion gap: 8 (ref 5–15)
BUN: 16 mg/dL (ref 8–23)
CO2: 23 mmol/L (ref 22–32)
Calcium: 9.1 mg/dL (ref 8.9–10.3)
Chloride: 107 mmol/L (ref 98–111)
Creatinine, Ser: 1.04 mg/dL — ABNORMAL HIGH (ref 0.44–1.00)
GFR, Estimated: 58 mL/min — ABNORMAL LOW (ref >60)
Glucose, Bld: 100 mg/dL — ABNORMAL HIGH (ref 70–99)
Potassium: 4 mmol/L (ref 3.5–5.1)
Sodium: 138 mmol/L (ref 135–145)

## 2021-02-16 LAB — CBC
HCT: 31.9 % — ABNORMAL LOW (ref 36.0–46.0)
Hemoglobin: 10.2 g/dL — ABNORMAL LOW (ref 12.0–15.0)
MCH: 26.1 pg (ref 26.0–34.0)
MCHC: 32 g/dL (ref 30.0–36.0)
MCV: 81.6 fL (ref 80.0–100.0)
Platelets: 183 10*3/uL (ref 150–400)
RBC: 3.91 MIL/uL (ref 3.87–5.11)
RDW: 15.3 % (ref 11.5–15.5)
WBC: 9.9 10*3/uL (ref 4.0–10.5)
nRBC: 0 % (ref 0.0–0.2)

## 2021-02-16 LAB — MAGNESIUM: Magnesium: 2.1 mg/dL (ref 1.7–2.4)

## 2021-02-16 MED ORDER — ISOSORBIDE MONONITRATE ER 30 MG PO TB24
30.0000 mg | ORAL_TABLET | Freq: Every day | ORAL | Status: DC
  Administered 2021-02-16: 30 mg via ORAL
  Filled 2021-02-16: qty 1

## 2021-02-16 MED ORDER — ENOXAPARIN SODIUM 60 MG/0.6ML IJ SOSY
60.0000 mg | PREFILLED_SYRINGE | Freq: Two times a day (BID) | INTRAMUSCULAR | 1 refills | Status: DC
  Filled 2021-02-16: qty 36, 30d supply, fill #0

## 2021-02-16 MED ORDER — ISOSORBIDE MONONITRATE ER 30 MG PO TB24
30.0000 mg | ORAL_TABLET | Freq: Every day | ORAL | 0 refills | Status: DC
  Filled 2021-02-16: qty 15, 15d supply, fill #0

## 2021-02-16 NOTE — Progress Notes (Signed)
Progress Note  Patient Name: Allison [name] Date of Encounter: 02/16/2021  [facility] Cardiologist: Donato Heinz, MD   Subjective   Reports chest pain improved from yesterday   Inpatient Medications    Scheduled Meds:  amLODipine  5 mg Oral Daily   aspirin  81 mg Oral Daily   carvedilol  6.25 mg Oral BID WC   dorzolamide-timolol  1 drop Both Eyes BID   enoxaparin (LOVENOX) injection  60 mg Subcutaneous Q12H   ezetimibe  10 mg Oral Daily   guaiFENesin-dextromethorphan  10 mL Oral QID   isosorbide mononitrate  30 mg Oral Daily   latanoprost  1 drop Both Eyes QHS   loratadine  10 mg Oral Daily   mometasone-formoterol  2 puff Inhalation BID   pantoprazole sodium  40 mg Oral Daily   predniSONE  60 mg Oral Q breakfast   Continuous Infusions:   PRN Meds: acetaminophen **OR** acetaminophen (TYLENOL) oral liquid 160 mg/5 mL **OR** acetaminophen, albuterol, ALPRAZolam, HYDROcodone bit-homatropine, menthol-cetylpyridinium, morphine injection, senna-docusate   Vital Signs    Vitals:   02/15/21 2351 02/16/21 0316 02/16/21 0831 02/16/21 0847  BP: (!) 150/81 (!) 152/75  (!) 179/80  Pulse: 77 66  82  Resp: 18 18    Temp: 98.2 F (36.8 C) 98.1 F (36.7 C)  98.2 F (36.8 C)  TempSrc: Oral   Oral  SpO2: 97% 97% 97% 98%  Weight:      Height:        Intake/Output Summary (Last 24 hours) at 02/16/2021 0949 Last data filed at 02/16/2021 0500 Gross per 24 hour  Intake 321.77 ml  Output --  Net 321.77 ml    Last 3 Weights 02/13/2021 02/07/2021 02/07/2021  Weight (lbs) 129 lb 10.1 oz 127 lb 127 lb 1.6 oz  Weight (kg) 58.8 kg 57.607 kg 57.652 kg      Telemetry    Normal sinus rhythm, PVCs, rate 60s to 110s.- Personally Reviewed  ECG    Normal sinus rhythm, rate 80, no ST abnormalities personally Reviewed  Physical Exam   GEN: No acute distress.   Neck: No JVD Cardiac: RRR, no murmurs, rubs, or gallops.  Respiratory: Scattered crackles GI: Soft,  nontender, non-distended  MS: No edema; No deformity. Neuro:  Nonfocal  Psych: Normal affect   Labs    High Sensitivity Troponin:   Recent Labs  Lab 02/07/21 2314 02/08/21 0154 02/13/21 1141 02/13/21 1425 02/14/21 0923  TROPONINIHS 184* 149* 1,008* 1,013* 903*       Chemistry Recent Labs  Lab 02/11/21 1811 02/12/21 1040 02/13/21 0417 02/14/21 0113 02/15/21 0731 02/16/21 0333  NA 139 137 138 138 140 138  K 2.8* 2.9* 4.7 4.5 3.8 4.0  CL 99 106 105 104 107 107  CO2 28 25 26 26 27 23   GLUCOSE 239* 102* 139* 136* 90 100*  BUN 19 12 10  24* 20 16  CREATININE 1.02* 0.76 0.96 1.11* 1.05* 1.04*  CALCIUM 9.5 8.0* 9.2 9.2 9.2 9.1  PROT 7.0 5.7* 6.3*  --   --   --   ALBUMIN 3.5 2.8* 3.0*  --   --   --   AST 39 29 36  --   --   --   ALT 43 31 46*  --   --   --   ALKPHOS 58 46 53  --   --   --   BILITOT 0.3 0.5 0.6  --   --   --  GFRNONAA 59* >60 >60 53* 57* 58*  ANIONGAP 12 6 7 8 6 8       Hematology Recent Labs  Lab 02/14/21 0113 02/15/21 0731 02/16/21 0333  WBC 11.6* 10.0 9.9  RBC 3.95 4.32 3.91  HGB 10.5* 11.3* 10.2*  HCT 31.7* 34.8* 31.9*  MCV 80.3 80.6 81.6  MCH 26.6 26.2 26.1  MCHC 33.1 32.5 32.0  RDW 14.9 15.1 15.3  PLT 165 209 183     BNP No results for input(s): BNP, PROBNP in the last 168 hours.    DDimer No results for input(s): DDIMER in the last 168 hours.   Radiology    No results found.  Cardiac Studies   Echo 02/13/2021:   1. Left ventricular ejection fraction, by estimation, is 60 to 65%. The  left ventricle has normal function. The left ventricle has no regional  wall motion abnormalities.   2. The mitral valve is normal in structure. Mild to moderate mitral valve  regurgitation.   Patient Profile     70 y.o. female with a hx of CAD status post DES to RCA 06/16/19, stage IV lung cancer, hypertension, hyperlipidemia, recent DVT/PE who presents with acute CVA and troponin elevation  Assessment & Plan    Elevated troponin:  Troponin 1008 > 1013> 903.  Echo shows no new wall motion abnormality.  Suspect demand ischemia in setting of acute CVA and recent PE -In setting of acute CVA and considering no new WMA on echo and flat troponin, no plan for ischemic evaluation at this time. -Continues to have chest pain.  Worse with deep breathing or coughing.  Given continuous pain with downtrending troponins, suspect noncardiac.  -Reports improvement with imdur, will increase to 30 mg daily  PE: Was on Eliquis prior to discharge.  Switched to lovenox per oncology recommendation  CAD: Status post drug-eluting stent to RCA on 06/16/2019.   -Continue aspirin 81 mg daily.   -Continue Coreg 6.25 mg twice daily -Has had issues starting statins due to transaminitis and myalgias.  Would continue Repatha   Hyperlipidemia: LDL 91 on 02/12/2021.  Continue Repatha   Hypertension: On carvedilol 6.25 mg twice daily, amlodipine 5 mg daily, increase imdur as above   Stage IV lung cancer: Followed by oncology  [facility] will sign off.   Medication Recommendations:  ASA 81 mg daily, imdur 30 mg daily, coreg 6.25 mg BID, amlodipine 5 mg daily, zetia 10 mg daily, Repatha, lovenox Other recommendations (labs, testing, etc):  None Follow up as an outpatient:  Scheduled 03/16/21   For questions or updates, please contact [facility] Please consult [email or web address] for contact info under        Signed, Donato Heinz, MD  02/16/2021, 9:49 AM

## 2021-02-16 NOTE — TOC Transition Note (Signed)
Transition of Care [facility]) - CM/SW Discharge Note   Patient Details  Name: Allison [name] MRN: [id number] Date of Birth: [birth year]  [facility] CM/SW Contact:  Pollie Friar, RN Phone Number: 02/16/2021, 10:39 AM   Clinical Narrative:    Patient is discharging home with outpatient therapy through [facility]. Information on the AVS.  Pts Lovenox sent to [facility] and will be delivered to the room. Other medications sent to her preferred pharmacy. Pt is aware. Patient assistance form for lovenox faxed into [employer] and they will update her on whether she is approved.  Pt has supervision at home and transportation to home.   Final next level of care: OP Rehab Barriers to Discharge: No Barriers Identified   Patient Goals and CMS Choice   CMS Medicare.gov Compare Post Acute Care list provided to:: Patient Choice offered to / list presented to : Patient  Discharge Placement                       Discharge Plan and Services   Discharge Planning Services: CM Consult                                 Social Determinants of Health (SDOH) Interventions     Readmission Risk Interventions No flowsheet data found.

## 2021-02-16 NOTE — Progress Notes (Signed)
Physical Therapy Treatment Patient Details Name: Allison [name] MRN: [id number] DOB: [birth year] Today's Date: 02/16/2021    History of Present Illness Patient is a 70 year old female presenting to ED with blurry vision gait deviations, listing to L. MRI reveal Patchy multifocal acute to subacute ischemic infarcts involving  the bilateral cerebral and cerebellar hemispheres. Patient recently D/C from hospital after treatment for PNA, acute PT and DVT. PMH includes lung CA, CAD, MI, glaucoma.    PT Comments    Pt received in supine, deferring OOB mobility but agreeable to limited bed-level session with focus on fall risk prevention and safety at home, especially in regard to energy conservation techniques (handout given to reinforce). Also discussed technique/frequency for Incentive Spirometry use, pt performs up to 1,078mL and discussed goal to work toward 2,000 mL per PT POC. Pt continues to benefit from PT services to progress toward functional mobility goals.    Follow Up Recommendations  Outpatient PT;Supervision for mobility/OOB (neurorehab for strength and balance training)     Equipment Recommendations  Cane    Recommendations for Other Services       Precautions / Restrictions Precautions Precautions: Fall Precaution Comments: low vision/vision deficits Restrictions Weight Bearing Restrictions: No    Mobility  Bed Mobility Overal bed mobility: Modified Independent  General bed mobility comments: supine to long sit to perform IS, pt declined OOB as she is wanting to rest        Modified Rankin (Stroke Patients Only) Modified Rankin (Stroke Patients Only) Pre-Morbid Rankin Score: No symptoms Modified Rankin: Moderately severe disability     Balance Overall balance assessment: Needs assistance Sitting-balance support: Feet supported;No upper extremity supported Sitting balance-Leahy Scale: Good Sitting balance - Comments: long sit in bed unsupported no  difficulty       Cognition Arousal/Alertness: Awake/alert Behavior During Therapy: WFL for tasks assessed/performed Overall Cognitive Status: Within Functional Limits for tasks assessed  General Comments: pleasant, conversational; pt c/o fatigue so limited bed-level session prior to DC      Exercises Other Exercises Other Exercises: IS x 10 reps max inhalation 1,000 mL (pt in long sit and coughing frequently)    General Comments General comments (skin integrity, edema, etc.): emphasis on home fall prevention and energy conservation (pt given handout and verbal/visual review/demo for techniques, very receptive and actively listening) also discussed use of IS with frequency/proper technique and to continue this at home.      Pertinent Vitals/Pain Pain Assessment: Faces Faces Pain Scale: Hurts a little bit Pain Location: discomfort from coughing Pain Descriptors / Indicators: Discomfort Pain Intervention(s): Limited activity within patient's tolerance;Repositioned           PT Goals (current goals can now be found in the care plan section) Acute Rehab PT Goals Patient Stated Goal: to improve her vision and balance PT Goal Formulation: With patient Time For Goal Achievement: 02/27/21 Potential to Achieve Goals: Good Progress towards PT goals: Progressing toward goals    Frequency    Min 4X/week      PT Plan Current plan remains appropriate       AM-PAC PT "6 Clicks" Mobility   Outcome Measure  Help needed turning from your back to your side while in a flat bed without using bedrails?: None Help needed moving from lying on your back to sitting on the side of a flat bed without using bedrails?: None Help needed moving to and from a bed to a chair (including a wheelchair)?: A Little Help needed standing  up from a chair using your arms (e.g., wheelchair or bedside chair)?: A Little Help needed to walk in hospital room?: A Little Help needed climbing 3-5 steps with a  railing? : A Little 6 Click Score: 20    End of Session   Activity Tolerance: Patient limited by fatigue;Patient tolerated treatment well Patient left: in bed;with call bell/phone within reach Nurse Communication: Mobility status PT Visit Diagnosis: Difficulty in walking, not elsewhere classified (R26.2);Unsteadiness on feet (R26.81)     Time: 1155-2080 PT Time Calculation (min) (ACUTE ONLY): 19 min  Charges:  $Self Care/Home Management: 8-22                     Liz Pinho P., PTA Acute Rehabilitation Services Pager: [phone] Office: [place] 02/16/2021, 1:54 PM

## 2021-02-16 NOTE — Progress Notes (Signed)
Occupational Therapy Treatment Patient Details Name: Allison [name] MRN: [id number] DOB: 07/09/1950 Today's Date: 02/16/2021    History of present illness Patient is a 70 year old female presenting to ED with blurry vision gait deviations, listing to L. MRI reveal Patchy multifocal acute to subacute ischemic infarcts involving  the bilateral cerebral and cerebellar hemispheres. Patient recently D/C from hospital after treatment for PNA, acute PT and DVT. PMH includes lung CA, CAD, MI, glaucoma   OT comments  Focus of session addressed low vision and maximizing independence and increasing safety with use of compensatory strategies. Pt apparently L eye dominant. Partial occlusion did not provide relief, however full occlusion of non-dominant R eye improved functional distant vision. In addition to visual deficits, pt reports she also has dry eyes and glaucoma and that glare bothers her eyes. Recommend pt try yellow clip lenses over her perscription glasses to reduce glare, use patch on R eye during ambulation to reduce risk of falls, and implement compensatory strategies for low vision regarding lighting, increasing contrast and reducing clutter. Also educated pt on strategies to reduce risk of falls. Pt to follow up with Dr Katy Fitch regarding possible referral to neuro-opthamologist/optometrist. Pt very appreciative.   Follow Up Recommendations  Outpatient OT (neuro outpt OT; follow up with eye doctor)    Equipment Recommendations  Tub/shower seat    Recommendations for Other Services      Precautions / Restrictions Precautions Precautions: Fall Precaution Comments: low vision/vision deficits       Mobility Bed Mobility                    Transfers                      Balance                                           ADL either performed or assessed with clinical judgement   ADL                                         General  ADL Comments: Educated on strategies to reduce risk of falls     Vision   Vision Assessment?: Vision impaired- to be further tested in functional context Additional Comments: Pt complaining of distorted vision. Educated on blurred vision most likely being a result of her B cerebellar and L occipital strokes in addition to her glaucoma and dry eyes. Pt apparently LL eye dominant. Distant vision improved with R eye occluded completely with patch; patial occluaion did not appear to help.   Perception     Praxis      Cognition Arousal/Alertness: Awake/alert Behavior During Therapy: WFL for tasks assessed/performed Overall Cognitive Status: Within Functional Limits for tasks assessed                                          Exercises     Shoulder Instructions       General Comments      Pertinent Vitals/ Pain       Pain Assessment: Faces Faces Pain Scale: Hurts a little bit Pain Location: discomfort from coughing Pain Descriptors /  Indicators: Discomfort Pain Intervention(s): Limited activity within patient's tolerance  Home Living                                          Prior Functioning/Environment              Frequency  Min 2X/week        Progress Toward Goals  OT Goals(current goals can now be found in the care plan section)  Progress towards OT goals: Progressing toward goals  Acute Rehab OT Goals Patient Stated Goal: to improve her vision and balance OT Goal Formulation: With patient Time For Goal Achievement: 02/26/21 Potential to Achieve Goals: Good ADL Goals Pt Will Perform Lower Body Dressing: Independently;sit to/from stand;sitting/lateral leans Pt Will Transfer to Toilet: with modified independence;ambulating Additional ADL Goal #1: Patient will demonstrate 100% accuracy reaching for grooming/hygiene items in 3/3 trials in order to participate in self care tasks.  Plan Discharge plan remains  appropriate;Frequency remains appropriate    Co-evaluation                 AM-PAC OT "6 Clicks" Daily Activity     Outcome Measure   Help from another person eating meals?: None Help from another person taking care of personal grooming?: None Help from another person toileting, which includes using toliet, bedpan, or urinal?: A Little Help from another person bathing (including washing, rinsing, drying)?: A Little Help from another person to put on and taking off regular upper body clothing?: None Help from another person to put on and taking off regular lower body clothing?: A Little 6 Click Score: 21    End of Session    OT Visit Diagnosis: Unsteadiness on feet (R26.81) Pain - Right/Left:  (discomfort from coughing)   Activity Tolerance Patient tolerated treatment well   Patient Left in bed;with call bell/phone within reach;with family/visitor present   Nurse Communication Mobility status;Other (comment) (DC recommendations)        Time: 1937-9024 OT Time Calculation (min): 31 min  Charges: OT General Charges $OT Visit: 1 Visit OT Treatments $Therapeutic Activity: 8-22 mins  Maurie Boettcher, OT/L   Acute OT Clinical Specialist [place] Pager [phone] Office [phone]    [facility] 02/16/2021, 9:53 AM

## 2021-02-16 NOTE — Consult Note (Signed)
   [facility] CM Inpatient Consult   02/16/2021  Allison [name] Nov 13, 1950 [id number]  [place] [facility] [ACO] Patient: Allison [name] Medicare  Primary Care Provider:  Ronnell Freshwater, NP, [place] [facility], patient endorses [name], MD as well  Patient screened for  less than 7 days readmission hospitalization with noted extreme high risk score for unplanned readmission risk.  Patient assessed for potential [place] Management service needs for post hospital transition.  Review of patient's medical record reveals patient is being recommended for Outpatient rehab. Met with patient at bedside regarding her primary care provider.  Explained that she will get telephonic calls for follow up.  She states she considers Dr. [name] as her primary physician to follow up with and that she has only seen Leretha Pol, NP once.  Patient currently states she did not want to get a lot of different calls as she screens for spam calls.  Patient states she is awaiting to get her appointment for OP Rehab.   Plan:  Patient to receive Stroke EMMI follow up. Denies other needs at this time.  For questions contact:   Natividad Brood, RN BSN [facility] Liaison  [phone] business mobile phone Toll free office [phone]  Fax number: [phone] [email or web address]

## 2021-02-16 NOTE — Plan of Care (Signed)
Pt is alert oriented x 4. Ambulatory. Pt NIH-O , pt has received stroke education and how to properly administer lovenox injection at home.    Problem: Education: Goal: Knowledge of General Education information will improve Description: Including pain rating scale, medication(s)/side effects and non-pharmacologic comfort measures Outcome: Adequate for Discharge   Problem: Health Behavior/Discharge Planning: Goal: Ability to manage health-related needs will improve Outcome: Adequate for Discharge   Problem: Clinical Measurements: Goal: Ability to maintain clinical measurements within normal limits will improve Outcome: Adequate for Discharge Goal: Will remain free from infection Outcome: Adequate for Discharge Goal: Diagnostic test results will improve Outcome: Adequate for Discharge Goal: Respiratory complications will improve Outcome: Adequate for Discharge Goal: Cardiovascular complication will be avoided Outcome: Adequate for Discharge   Problem: Activity: Goal: Risk for activity intolerance will decrease Outcome: Adequate for Discharge   Problem: Nutrition: Goal: Adequate nutrition will be maintained Outcome: Adequate for Discharge   Problem: Coping: Goal: Level of anxiety will decrease Outcome: Adequate for Discharge   Problem: Elimination: Goal: Will not experience complications related to bowel motility Outcome: Adequate for Discharge Goal: Will not experience complications related to urinary retention Outcome: Adequate for Discharge   Problem: Pain Managment: Goal: General experience of comfort will improve Outcome: Adequate for Discharge   Problem: Safety: Goal: Ability to remain free from injury will improve Outcome: Adequate for Discharge   Problem: Skin Integrity: Goal: Risk for impaired skin integrity will decrease Outcome: Adequate for Discharge

## 2021-02-16 NOTE — Discharge Summary (Addendum)
Physician Discharge Summary  Allison [name] NWG:[id number] DOB: Jan 29, 1951 DOA: 02/11/2021  PCP: Ronnell Freshwater, NP  Admit date: 02/11/2021 Discharge date: 02/16/2021  Admitted From: home Disposition:  home  Recommendations for Outpatient Follow-up:  Follow up with PCP in 1-2 weeks Please obtain BMP/CBC in one week Please follow up on the following pending results:  Home Health: none Equipment/Devices: none  Discharge Condition: stable CODE STATUS: Full code Diet recommendation: heart healthy  HPI: Per admitting MD, Allison [name] is a 70 y.o. female with medical history significant for HTN, adenocarcinoma of right lung followed by oncology and had radiation, recent PE and DVT which she was hospitalized for and discharged yesterday on Eliquis.  She reports that when she went to bed on the evening of February 10, 2021 around 830 she was in her normal state of health and felt well.  When she woke up on the morning of August 26 and 22 she had blurry vision and felt like her gait was off and she was always pointed toward the left.  She did have dizziness as well when she woke up that morning.  She woke up on August 26 at 8 AM.  The symptoms persisted all day and she came to the emergency room for evaluation.  She does report having some mild shortness of breath with exertion but it goes away when she sits and rests.  She felt this was just secondary to her lung cancer and recent PE.  She states she has not had any fevers, chills, abdominal pain, urinary symptoms, chest pain or palpitations, slurred speech, drooping face, numbness or weakness of extremities.  She had no loss of consciousness or seizure-like activity.  She had no bowel or bladder incontinence. Denies tobacco alcohol or illicit drug use.  Hospital Course / Discharge diagnoses: Principal problem Bilateral cerebral and cerebellar infarcts likely embolic in nature in the setting of hypercoagulable state due to stage IV lung  cancer -CT head read cellular hyperdensity.CTA questions occlusion of left vertebral artery at skull base. MRI shows patchy multifocal acute to subacute and ischemic infarct in bilateral cerebral and cerebellar hemisphere. Neurology consulted. Patient was on Eliquis prior to admission.  Discussed with Dr. Julien Nordmann.  Recommend Lovenox as recommended by neurology as well. Repeat MRI in 3 to 4 weeks recommended to rule out metastasis although appearance is more likely consistent with stroke.  PT recommends home therapy.   Active problems Chest pain History of CAD Elevated troponin Patient reports ongoing chest pain which appears to be more associated with her malignancy and radiation history. EKG unremarkable for any acute ischemic events. Troponins were elevated although delta troponin not significantly elevated. Patient had similar elevated troponin during last admission. Repeat echocardiogram shows preserved EF without any wall Motion abnormality. Cardiology consulted.  Negative stress test in October 2021. No ischemic work-up for now.  Continue Repatha on discharge. 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 Pneumonia ruled out based on the work-up. Patient was treated with 3 days of IV antibiotics.  Recommendation was to continue antibiotic on discharge although has not received antibiotics since admission.  At present does not appear to have any pneumonia and Keflex will not be initiated. Patient is on prednisone prior to admission.  I would recommend continuing prednisone for another week before tapering it off. Repeat chest x-ray shows no acute abnormality. Adenocarcinoma of the lung, stage IV -Outpatient immunotherapy currently on hold. On oral prednisone. Dyspnea Dry cough -Symptomatology  likely associated with her malignancy rather than any other etiology. Patient had tried multiple medications for dry cough and has not been effective.  PE -on  Lovenox  Sepsis ruled out   Discharge Instructions  Discharge Instructions     Ambulatory referral to Neurology   Complete by: As directed    An appointment is requested in approximately: 4 weeks   Ambulatory referral to Physical Therapy   Complete by: As directed       Allergies as of 02/16/2021       Reactions   Penicillins Other (See Comments)   SYNCOPE PATIENT HAS HAD A PCN REACTION WITH IMMEDIATE RASH, FACIAL/TONGUE/THROAT SWELLING, SOB, OR LIGHTHEADEDNESS WITH HYPOTENSION:  #  #  YES  #  # Has patient had a PCN reaction causing severe rash involving mucus membranes or skin necrosis: No Has patient had a PCN reaction that required hospitalization: No Has patient had a PCN reaction occurring within the last 10 years: No   Vicodin [hydrocodone-acetaminophen] Other (See Comments)   Sped up heart and breathing   Other Other (See Comments)   Glaucoma eye drop        Medication List     STOP taking these medications    Apixaban Starter Pack (10mg  and 5mg ) Commonly known as: ELIQUIS STARTER PACK   cephALEXin 500 MG capsule Commonly known as: KEFLEX   DELSYM PO   hydrochlorothiazide 25 MG tablet Commonly known as: HYDRODIURIL       TAKE these medications    acetaminophen 500 MG tablet Commonly known as: TYLENOL Take 1,000 mg by mouth every 6 (six) hours as needed (pain).   albuterol 108 (90 Base) MCG/ACT inhaler Commonly known as: VENTOLIN HFA Inhale 2 puffs into the lungs every 6 (six) hours as needed for wheezing or shortness of breath.   ALPRAZolam 0.25 MG tablet Commonly known as: XANAX Take 0.25 mg by mouth 2 (two) times daily as needed for anxiety.   amLODipine 5 MG tablet Commonly known as: NORVASC Take 5 mg by mouth daily.   aspirin 81 MG chewable tablet Chew 1 tablet (81 mg total) by mouth daily.   b complex vitamins capsule Take 1 capsule by mouth daily.   benzonatate 200 MG capsule Commonly known as: TESSALON Take 1 capsule (200 mg  total) by mouth 3 (three) times daily as needed for cough.   carvedilol 6.25 MG tablet Commonly known as: COREG TAKE 1 TABLET BY MOUTH 2 TIMES DAILY WITH A MEAL. PLEASE SCHEDULE APPOINTMENT FOR FUTURE REFILLS. What changed: See the new instructions.   cetirizine 5 MG tablet Commonly known as: ZYRTEC Take 5 mg by mouth daily.   docusate sodium 100 MG capsule Commonly known as: COLACE Take 1 capsule (100 mg total) by mouth daily.   dorzolamide-timolol 22.3-6.8 MG/ML ophthalmic solution Commonly known as: COSOPT Place 1 drop into both eyes 2 (two) times daily.   enoxaparin 60 MG/0.6ML injection Commonly known as: LOVENOX Inject 0.6 mLs (60 mg total) into the skin every 12 (twelve) hours.   esomeprazole 20 MG packet Commonly known as: NEXIUM Take 20 mg by mouth daily.   ezetimibe 10 MG tablet Commonly known as: ZETIA Take 10 mg by mouth daily.   guaiFENesin-dextromethorphan 100-10 MG/5ML syrup Commonly known as: ROBITUSSIN DM Take 10 mLs by mouth 4 (four) times daily.   HYDROcodone bit-homatropine 5-1.5 MG/5ML syrup Commonly known as: HYCODAN Take 5 mLs by mouth every 4 (four) hours as needed for cough.   isosorbide mononitrate 30  MG 24 hr tablet Commonly known as: IMDUR Take 1 tablet (30 mg total) by mouth daily.   latanoprost 0.005 % ophthalmic solution Commonly known as: XALATAN Place 1 drop into both eyes at bedtime.   menthol-cetylpyridinium 3 MG lozenge Commonly known as: CEPACOL Take 1 lozenge (3 mg total) by mouth as needed for sore throat.   mometasone-formoterol 100-5 MCG/ACT Aero Commonly known as: DULERA Inhale 2 puffs into the lungs 2 (two) times daily.   morphine 20 MG/5ML solution Take 0.6 mLs (2.4 mg total) by mouth every 4 (four) hours as needed (shortness of breath).   predniSONE 10 MG tablet Commonly known as: DELTASONE Take 60mg  daily for 3days, Take 50mg  daily for 7days,Take 40mg  daily for 7days,Take 30mg  daily for 7days,Take 20mg  daily for  7days,Take 10mg  daily for 7days, then stop What changed: See the new instructions.   prochlorperazine 10 MG tablet Commonly known as: COMPAZINE Take 1 tablet (10 mg total) by mouth every 6 (six) hours as needed for nausea or vomiting.   sucralfate 1 g tablet Commonly known as: CARAFATE Take 1 tablet (1 g total) by mouth 4 (four) times daily. with meals and at bedtime. Crush and dissolve in 10 mL of warm water prior to swallowing   VITAMIN D-VITAMIN K PO Take 1 tablet by mouth daily.        Follow-up Information     [facility] Follow up.   Specialty: Rehabilitation Why: Please call the outpatient rehab and schedule the first appointment. Ask about co pays with your [facility] Contact information: [address] [place] [id number] [place] [address] [place], [place], NP. Schedule an appointment as soon as possible for a visit in 1 week(s).   Specialty: Family Medicine Contact information: [place] [address] [phone]         Donato Heinz, MD .   Specialties: Cardiology, Radiology Contact information: [address] [place] [address] [phone]         Curt Bears, MD Follow up.   Specialty: Oncology Why: pt will need a repeat MRI brain in 1 month. Contact information: [place] [address] [phone]                 Consultations: Cardiology   Procedures/Studies:  CT ABDOMEN PELVIS WO CONTRAST  Result Date: 02/12/2021 CLINICAL DATA:  Abdominal pain. EXAM: CT ABDOMEN AND PELVIS WITHOUT CONTRAST TECHNIQUE: Multidetector CT imaging of the abdomen and pelvis was performed following the standard protocol without IV contrast. COMPARISON:  CT abdomen pelvis dated/CT dated 10/11/2020. FINDINGS: Evaluation of this exam is limited in the absence of intravenous contrast. Lower  chest: There is consolidative changes of the majority of the visualized right lung base. A partially seen right-sided pleural effusion and diffusely thickened appearance of the right pleural, likely representing malignant effusion and pleural implants. Innumerable nodules within the left lung base may represent metastatic disease although an atypical infection is not excluded. These findings are new compared to the prior CT of 10/11/2020 most consistent with progression of metastatic disease. There is coronary vascular calcification. No intra-abdominal free air or free fluid. Hepatobiliary: No focal liver abnormality is seen. No gallstones, gallbladder wall thickening, or biliary dilatation. Pancreas: Unremarkable. No pancreatic ductal dilatation or surrounding inflammatory changes. Spleen: Normal in size without focal abnormality. Adrenals/Urinary Tract: Adrenal glands are unremarkable. Kidneys are normal, without renal calculi,  focal lesion, or hydronephrosis. Bladder is unremarkable. Stomach/Bowel: Small scattered sigmoid diverticula without active inflammatory changes. There is moderate stool throughout the colon. There is no bowel obstruction or active inflammation. The appendix is not visualized with certainty. No inflammatory changes identified in the right lower quadrant. Vascular/Lymphatic: Mild aortoiliac atherosclerotic disease the IVC is unremarkable. Portal venous gas. There is no adenopathy. Reproductive: Hysterectomy.  No adnexal masses. Other: None Musculoskeletal: Degenerative changes of the spine. There is a transitional vertebra. There is disc desiccation and vacuum phenomena at L5-S1. Lower lumbar facet arthropathy. No acute osseous pathology. IMPRESSION: 1. No acute intra-abdominal or pelvic pathology. No evidence of metastatic disease within the abdomen or pelvis. 2. Colonic diverticulosis. No bowel obstruction. 3. Progression of metastatic disease in the lung bases. 4. Aortic Atherosclerosis  (ICD10-I70.0). Electronically Signed   By: Anner Crete M.D.   On: 02/12/2021 20:39   CT ANGIO HEAD W OR WO CONTRAST  Result Date: 02/13/2021 CLINICAL DATA:  Follow-up examination for acute stroke. EXAM: CT ANGIOGRAPHY HEAD AND NECK TECHNIQUE: Multidetector CT imaging of the head and neck was performed using the standard protocol during bolus administration of intravenous contrast. Multiplanar CT image reconstructions and MIPs were obtained to evaluate the vascular anatomy. Carotid stenosis measurements (when applicable) are obtained utilizing NASCET criteria, using the distal internal carotid diameter as the denominator. CONTRAST:  37mL OMNIPAQUE IOHEXOL 350 MG/ML SOLN COMPARISON:  Prior MRI from 02/11/2021 and CT from 02/12/2021. FINDINGS: CTA NECK FINDINGS Aortic arch: Visualized aortic arch normal in caliber with normal 3 vessel morphology. No stenosis seen about the origin of the great vessels. Right carotid system: Right common and internal carotid arteries patent without stenosis, dissection or occlusion. Left carotid system: Left common and internal carotid arteries patent without stenosis, dissection, or occlusion. Mild eccentric calcified plaque about the left bifurcation without significant stenosis. Vertebral arteries: Both vertebral arteries arise from the subclavian arteries. Dominant right vertebral artery widely patent within the neck without abnormality. Left vertebral artery patent proximally, but becomes increasingly attenuated as it courses cephalad, and occludes by the level of the skull base. This could reflect an underlying dissection. Skeleton: No visible acute osseous finding. No discrete or worrisome osseous lesions. Degenerative spondylosis noted at C4-5 through C6-7 without high-grade spinal stenosis. Degenerative changes noted about the left TMJ. Other neck: No other acute soft tissue abnormality within the neck. No mass or adenopathy. Few scattered right thyroid nodules noted,  largest of which measures 11 mm. Findings of doubtful significance given size and patient age, no follow-up imaging recommended (ref: J Am Coll Radiol. 2015 Feb;12(2): 143-50). Upper chest: Previously identified spiculated mass at the right lung apex again seen, similar to previous. Circumferential pleuroparenchymal thickening about the right lung with probable loculated fluid along the right major fissure, partially visualized, and concerning for pleural involvement of disease. Diffusely irregular nodularity and interlobular septal thickening again seen, consistent with lymphangitic spread of tumor. Irregular nodularity and ground-glass opacity within the visualized left lung could be related to lymphangitic spread of tumor and/or infection. Review of the MIP images confirms the above findings CTA HEAD FINDINGS Anterior circulation: Petrous segments patent bilaterally. Mild atheromatous change within the carotid siphons without hemodynamically significant stenosis. Tiny 2 mm outpouching arising from the cavernous left ICA could reflect a small aneurysm versus vascular infundibulum (series 6, image 225). Left A1 segment widely patent. Right A1 hypoplastic and/or absent. Normal anterior communicating artery complex. Both ACAs patent to their distal aspects, with the left being dominant. No  M1 stenosis or occlusion. Normal MCA bifurcations. Distal MCA branches perfused and symmetric, with no visible proximal MCA branch occlusion. Posterior circulation: Right vertebral artery patent to the vertebrobasilar junction without stenosis. Right PICA patent. Left vertebral artery occluded at the skull base. Minimal retrograde filling into the distal left V4 segment across the vertebrobasilar junction noted (series 6, image 209). Left PICA not visualized. Basilar patent to its distal aspect without stenosis. Superior cerebral arteries patent bilaterally. Left PCA supplied via the basilar. Right PCA supplied via the basilar as  well as a prominent right posterior communicating artery. PCAs patent to their distal aspects without stenosis. Venous sinuses: Grossly patent allowing for timing the contrast bolus. Anatomic variants: None significant. Review of the MIP images confirms the above findings IMPRESSION: 1. Increasing attenuation within the left vertebral artery as it courses cephalad within the neck, with occlusion at the skull base. Finding is age indeterminate, but could reflect changes of an underlying dissection. 2. Mild atheromatous change elsewhere about the major arterial vasculature of the head and neck. No other large vessel occlusion. No other proximal high-grade or correctable stenosis. 3. 2 mm outpouching arising from the cavernous left ICA, which could reflect a small aneurysm versus vascular infundibulum. 4. Spiculated right apical mass, consistent with known malignancy. Diffuse pleuroparenchymal thickening about the right lung with scattered interlobular septal thickening and nodularity within the remainder of the visualized lungs, concerning for lymphangitic spread of tumor. Superimposed infection could be considered in the correct clinical setting. Electronically Signed   By: Jeannine Boga M.D.   On: 02/13/2021 02:54   DG Chest 2 View  Result Date: 02/13/2021 CLINICAL DATA:  Shortness of breath for several weeks. History of lung cancer. EXAM: CHEST - 2 VIEW COMPARISON:  Chest x-ray dated 02/07/2021. Chest CT dated 02/07/2021. FINDINGS: Heart size and mediastinal contours are stable. Dense opacity within the RIGHT lower lung is unchanged, corresponding to the mix of consolidations and pleural effusion better demonstrated on earlier CT. Additional mild interstitial prominence throughout the RIGHT upper lobe and LEFT lung is stable. Spiculated mass within the RIGHT lung apex is better demonstrated on earlier chest CT. IMPRESSION: 1. Stable chest x-ray. 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 2. Interstitial prominence bilaterally, suspicious for diffuse lymphangitic spread of malignancy. 3. Spiculated mass within the RIGHT lung apex is better demonstrated on earlier chest CT of 02/07/2021. Electronically Signed   By: Franki Cabot M.D.   On: 02/13/2021 15:25   CT HEAD WO CONTRAST (5MM)  Result Date: 02/12/2021 CLINICAL DATA:  Evaluate for possible bleed. EXAM: CT HEAD WITHOUT CONTRAST TECHNIQUE: Contiguous axial images were obtained from the base of the skull through the vertex without intravenous contrast. COMPARISON:  Head CT dated 02/11/2021. FINDINGS: Brain: Similar appearance of an ill-defined small hypodense focus in the right cerebellum. The ventricles and sulci are otherwise appropriate size for patient's age. There is no acute intracranial hemorrhage. No mass effect or midline shift no extra-axial fluid collection. Vascular: No hyperdense vessel or unexpected calcification. Skull: Normal. Negative for fracture or focal lesion. Sinuses/Orbits: No acute finding. Other: None IMPRESSION: Similar appearance of an ill-defined small hypodense focus in the right cerebellum. No acute intracranial hemorrhage. Electronically Signed   By: Anner Crete M.D.   On: 02/12/2021 04:00   CT Head Wo  Contrast  Result Date: 02/11/2021 CLINICAL DATA:  Sensation of everything in body being pulled to the left EXAM: CT HEAD WITHOUT CONTRAST TECHNIQUE: Contiguous axial images were obtained from the base of the skull through the vertex without intravenous contrast. COMPARISON:  MR brain dated 11/09/2020 FINDINGS: Brain: No evidence of acute infarction, hemorrhage, hydrocephalus, extra-axial collection or mass effect. However, there is a subtle possible 8 mm hypodense in the right  cerebellum (series 2/image 9), not evident on prior MR, raising concern for small acute/subacute infarct or possibly a metastasis given the history of prior lung cancer. Vascular: No hyperdense vessel or unexpected calcification. Skull: Normal. Negative for fracture or focal lesion. Sinuses/Orbits: The visualized paranasal sinuses are essentially clear. The mastoid air cells are unopacified. Other: None. IMPRESSION: Possible 8 mm lesion in the right cerebellum, equivocal. This was not evident on prior MRI, raising concern for small acute/subacute infarct or possibly a metastasis. Consider MRI for further characterization. Electronically Signed   By: Julian Hy M.D.   On: 02/11/2021 19:11   CT ANGIO NECK W OR WO CONTRAST  Result Date: 02/13/2021 CLINICAL DATA:  Follow-up examination for acute stroke. EXAM: CT ANGIOGRAPHY HEAD AND NECK TECHNIQUE: Multidetector CT imaging of the head and neck was performed using the standard protocol during bolus administration of intravenous contrast. Multiplanar CT image reconstructions and MIPs were obtained to evaluate the vascular anatomy. Carotid stenosis measurements (when applicable) are obtained utilizing NASCET criteria, using the distal internal carotid diameter as the denominator. CONTRAST:  63mL OMNIPAQUE IOHEXOL 350 MG/ML SOLN COMPARISON:  Prior MRI from 02/11/2021 and CT from 02/12/2021. FINDINGS: CTA NECK FINDINGS Aortic arch: Visualized aortic arch normal in caliber with normal 3 vessel morphology. No stenosis seen about the origin of the great vessels. Right carotid system: Right common and internal carotid arteries patent without stenosis, dissection or occlusion. Left carotid system: Left common and internal carotid arteries patent without stenosis, dissection, or occlusion. Mild eccentric calcified plaque about the left bifurcation without significant stenosis. Vertebral arteries: Both vertebral arteries arise from the subclavian arteries. Dominant right  vertebral artery widely patent within the neck without abnormality. Left vertebral artery patent proximally, but becomes increasingly attenuated as it courses cephalad, and occludes by the level of the skull base. This could reflect an underlying dissection. Skeleton: No visible acute osseous finding. No discrete or worrisome osseous lesions. Degenerative spondylosis noted at C4-5 through C6-7 without high-grade spinal stenosis. Degenerative changes noted about the left TMJ. Other neck: No other acute soft tissue abnormality within the neck. No mass or adenopathy. Few scattered right thyroid nodules noted, largest of which measures 11 mm. Findings of doubtful significance given size and patient age, no follow-up imaging recommended (ref: J Am Coll Radiol. 2015 Feb;12(2): 143-50). Upper chest: Previously identified spiculated mass at the right lung apex again seen, similar to previous. Circumferential pleuroparenchymal thickening about the right lung with probable loculated fluid along the right major fissure, partially visualized, and concerning for pleural involvement of disease. Diffusely irregular nodularity and interlobular septal thickening again seen, consistent with lymphangitic spread of tumor. Irregular nodularity and ground-glass opacity within the visualized left lung could be related to lymphangitic spread of tumor and/or infection. Review of the MIP images confirms the above findings CTA HEAD FINDINGS Anterior circulation: Petrous segments patent bilaterally. Mild atheromatous change within the carotid siphons without hemodynamically significant stenosis. Tiny 2 mm outpouching arising from the cavernous left ICA could reflect a small aneurysm versus vascular infundibulum (series 6, image 225). Left A1  segment widely patent. Right A1 hypoplastic and/or absent. Normal anterior communicating artery complex. Both ACAs patent to their distal aspects, with the left being dominant. No M1 stenosis or occlusion.  Normal MCA bifurcations. Distal MCA branches perfused and symmetric, with no visible proximal MCA branch occlusion. Posterior circulation: Right vertebral artery patent to the vertebrobasilar junction without stenosis. Right PICA patent. Left vertebral artery occluded at the skull base. Minimal retrograde filling into the distal left V4 segment across the vertebrobasilar junction noted (series 6, image 209). Left PICA not visualized. Basilar patent to its distal aspect without stenosis. Superior cerebral arteries patent bilaterally. Left PCA supplied via the basilar. Right PCA supplied via the basilar as well as a prominent right posterior communicating artery. PCAs patent to their distal aspects without stenosis. Venous sinuses: Grossly patent allowing for timing the contrast bolus. Anatomic variants: None significant. Review of the MIP images confirms the above findings IMPRESSION: 1. Increasing attenuation within the left vertebral artery as it courses cephalad within the neck, with occlusion at the skull base. Finding is age indeterminate, but could reflect changes of an underlying dissection. 2. Mild atheromatous change elsewhere about the major arterial vasculature of the head and neck. No other large vessel occlusion. No other proximal high-grade or correctable stenosis. 3. 2 mm outpouching arising from the cavernous left ICA, which could reflect a small aneurysm versus vascular infundibulum. 4. Spiculated right apical mass, consistent with known malignancy. Diffuse pleuroparenchymal thickening about the right lung with scattered interlobular septal thickening and nodularity within the remainder of the visualized lungs, concerning for lymphangitic spread of tumor. Superimposed infection could be considered in the correct clinical setting. Electronically Signed   By: Jeannine Boga M.D.   On: 02/13/2021 02:54   CT Chest W Contrast  Result Date: 02/06/2021 CLINICAL DATA:  Non-small cell lung cancer  staging, ongoing [place] EXAM: CT CHEST, ABDOMEN, AND PELVIS WITH CONTRAST TECHNIQUE: Multidetector CT imaging of the chest, abdomen and pelvis was performed following the standard protocol during bolus administration of intravenous contrast. CONTRAST:  27mL OMNIPAQUE IOHEXOL 350 MG/ML SOLN, additional oral enteric contrast COMPARISON:  PET-CT, 11/09/2020 FINDINGS: CT CHEST FINDINGS Cardiovascular: Aortic atherosclerosis. Normal heart size. Right coronary artery calcifications and stents. No pericardial effusion. Mediastinum/Nodes: Slight interval decrease in size of prominent, although no longer pathologically enlarged mediastinal lymph nodes, index pretracheal node measuring 0.9 x 0.7 cm, previously 1.3 x 1.3 cm (series 2, image 20). Thyroid gland, trachea, and esophagus demonstrate no significant findings. Lungs/Pleura: Interval decrease in size of a mass of the right pulmonary apex, measuring 2.8 x 1.8 cm, previously 4.4 x 3.5 cm. 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 There are areas of significant consolidation in right middle and right lower lobes (series 6, image 80). No pleural effusion or pneumothorax. Musculoskeletal: No chest wall mass or suspicious bone lesions identified. CT ABDOMEN PELVIS FINDINGS Hepatobiliary: No solid liver abnormality is seen. No gallstones, gallbladder wall thickening, or biliary dilatation. Pancreas: Unremarkable. No pancreatic ductal dilatation or surrounding inflammatory changes. Spleen: Normal in size without significant abnormality. Adrenals/Urinary Tract: Adrenal glands are unremarkable. Kidneys are normal, without renal calculi, solid lesion, or hydronephrosis. Bladder is unremarkable. Stomach/Bowel: Stomach is within normal limits.  Appendix is not clearly visualized and may be surgically absent. No evidence of bowel wall thickening, distention, or  inflammatory changes. Vascular/Lymphatic: Aortic atherosclerosis. No enlarged abdominal or pelvic lymph nodes. Reproductive: Status post hysterectomy. Other: No abdominal wall hernia or abnormality. No abdominopelvic ascites. Musculoskeletal: No acute or significant osseous findings. IMPRESSION: 1. Interval decrease in size of a mass of the right pulmonary apex. 2. Slight interval decrease in size of prominent, previously FDG avid mediastinal lymph nodes. 3. 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 4. There are areas of significant consolidation in right middle and right lower lobes, which may reflect confluent metastatic disease, or alternately infectious, inflammatory, or postobstructive airspace disease. 5. Overall constellation of findings is consistent with mixed response to treatment. 6. No evidence of metastatic disease in the abdomen or pelvis. 7. Coronary artery disease. Aortic Atherosclerosis (ICD10-I70.0). Electronically Signed   By: Eddie Candle M.D.   On: 02/06/2021 14:23   CT Angio Chest Pulmonary Embolism (PE) W or WO Contrast  Addendum Date: 02/07/2021   ADDENDUM REPORT: 02/07/2021 22:40 ADDENDUM: These results were called by telephone at the time of interpretation on 02/07/2021 at 10:39 pm to provider [facility] , who verbally acknowledged these results. Electronically Signed   By: Fidela Salisbury M.D.   On: 02/07/2021 22:40   Result Date: 02/07/2021 CLINICAL DATA:  PE suspected, high prob. Dyspnea. Non-small cell lung cancer EXAM: CT ANGIOGRAPHY CHEST WITH CONTRAST TECHNIQUE: Multidetector CT imaging of the chest was performed using the standard protocol during  bolus administration of intravenous contrast. Multiplanar CT image reconstructions and MIPs were obtained to evaluate the vascular anatomy. CONTRAST:  64mL OMNIPAQUE IOHEXOL 350 MG/ML SOLN COMPARISON:  02/04/2021 FINDINGS: Cardiovascular: There is adequate opacification of the pulmonary arterial tree. There is a central intraluminal branching filling defect within the posterior basal segmental pulmonary artery of the left lower lobe in keeping with acute pulmonary embolism. The embolic burden is small. The central pulmonary arteries are not enlarged. There is no CT evidence of right heart strain. Mild coronary artery calcification. Global cardiac size within normal limits. No pericardial effusion. Mild atherosclerotic calcification is seen within the thoracic aorta. No aortic aneurysm. Mediastinum/Nodes: Rind like soft tissue along the paramediastinal pleural surface is indistinguishable from infiltrative soft tissue within the right paratracheal and subcarinal mediastinum though this may be in part related to radiographic technique. No pathologic thoracic adenopathy. The esophagus is unremarkable. Visualized thyroid unremarkable. Lungs/Pleura: Spiculated mass within the right apex is unchanged measuring 19 x 29 mm at axial image # 32/6. There is progressive consolidation within the a basilar right middle lobe and right lower lobe as well as irregular, nodular interlobular septal thickening in keeping with lymphangitic spread of malignancy. Superimposed infection within the right lung base is difficult to exclude. Small right pleural effusion is present. Rind like soft tissue involving the right hemithorax which demonstrates volume loss and compared to into the left may represent pleural metastatic disease but is not well characterized on this examination. Nodular, irregular septal thickening within the left upper lobe is not as well characterized on this examination due to motion artifact, but appears stable since  prior examination, again compatible with changes of lymphangitic spread of malignancy. No pneumothorax. No pleural effusion on the left. Central airways are patent. Upper Abdomen: No acute abnormality. Musculoskeletal: No lytic or blastic bone lesion. No acute bone abnormality. Review of the MIP images confirms the above findings. IMPRESSION: Acute  pulmonary embolism. Embolic burden is small. No CT evidence of right heart strain. Progressive consolidation within the right lower lobe. While peribronchial consolidation likely represents a component of lymphangitic spread of malignancy, superimposed infection is difficult to exclude and is suspected given the relatively rapid progression of right lower lobe, in particular, consolidation. Small right pleural effusion. Rind like soft tissue within the right hemithorax with associated right-sided volume loss suspicious for pleural metastatic disease. Mild coronary artery calcification Aortic Atherosclerosis (ICD10-I70.0). Attempts are being made at this time to contact the managing clinician for direct verbal communication of these findings. Electronically Signed: By: Fidela Salisbury M.D. On: 02/07/2021 21:35   MR Brain W and Wo Contrast  Result Date: 02/11/2021 CLINICAL DATA:  Initial evaluation for neuro deficit, question CVA versus metastatic disease. History of stage IV lung cancer. EXAM: MRI HEAD WITHOUT AND WITH CONTRAST TECHNIQUE: Multiplanar, multiecho pulse sequences of the brain and surrounding structures were obtained without and with intravenous contrast. CONTRAST:  15mL GADAVIST GADOBUTROL 1 MMOL/ML IV SOLN COMPARISON:  Head CT from earlier the same day as well as previous MRI from 11/09/2020. FINDINGS: Brain: Cerebral volume within normal limits for age. Patchy T2/FLAIR signal abnormality seen involving the periventricular and deep white matter both cerebral hemispheres, nonspecific, but most likely related to chronic microvascular ischemic disease.  Multiple scattered foci of diffusion abnormality are seen involving the cerebellum bilaterally, left slightly worse than right (series 5, images 56, 59). Additional patchy multifocal areas of diffusion abnormality seen involving the cortical and subcortical aspect of both cerebral hemispheres, most pronounced at the left occipital lobe (series 5, images 84, 82, 79, 69, 72). Foci involving the frontal parietal regions are somewhat watershed in distribution. Findings consistent with acute to subacute ischemic infarcts. Small focus of intrinsic T1 hyperintensity with susceptibility artifact present at the right cerebellum, consistent with petechial blood products (series 11, image 35 and series 9, image 14). No frank hemorrhagic transformation. No visible hemorrhage on prior CT. Additionally, scattered foci of patchy enhancement seen about several of these areas of ischemia, felt to be most consistent with probable evolving subacute ischemic change (series 15, images 123, 83, 75, 68). No other definite mass lesion or definite evidence for intracranial metastatic disease. No mass effect or midline shift. No hydrocephalus or extra-axial fluid collection. Empty sella noted. Midline structures intact. Vascular: Abnormal flow void within the left vertebral artery, which could be related to slow flow and/or occlusion (series 10, image 2). Enhancement is seen within the left vertebral artery following contrast administration. Major intracranial vascular flow voids are otherwise maintained. Skull and upper cervical spine: Craniocervical junction within normal limits. Bone marrow signal intensity normal. No focal marrow replacing lesion. No scalp soft tissue abnormality. Sinuses/Orbits: Globes and orbital soft tissues within normal limits. Paranasal sinuses are largely clear. No mastoid effusion. Inner ear structures grossly normal. Other: None. IMPRESSION: 1. Patchy multifocal acute to subacute ischemic infarcts involving the  bilateral cerebral and cerebellar hemispheres. Associated small volume petechial blood products at the right cerebellum without frank hemorrhagic transformation. No significant mass effect. 2. Associated scattered areas of patchy enhancement as above, favored to be secondary to subacute ischemic changes. A short interval follow-up MRI to ensure these changes resolve is recommended, particularly given the patient history of stage IV lung cancer and to ensure no underlying metastatic disease is present. 3. Abnormal flow void within the left vertebral artery, which could be related to slow flow and/or occlusion. 4. Underlying chronic microvascular ischemic disease. Electronically Signed  By: Jeannine Boga M.D.   On: 02/11/2021 23:11   CT Abdomen Pelvis W Contrast  Result Date: 02/06/2021 CLINICAL DATA:  Non-small cell lung cancer staging, ongoing [place] EXAM: CT CHEST, ABDOMEN, AND PELVIS WITH CONTRAST TECHNIQUE: Multidetector CT imaging of the chest, abdomen and pelvis was performed following the standard protocol during bolus administration of intravenous contrast. CONTRAST:  60mL OMNIPAQUE IOHEXOL 350 MG/ML SOLN, additional oral enteric contrast COMPARISON:  PET-CT, 11/09/2020 FINDINGS: CT CHEST FINDINGS Cardiovascular: Aortic atherosclerosis. Normal heart size. Right coronary artery calcifications and stents. No pericardial effusion. Mediastinum/Nodes: Slight interval decrease in size of prominent, although no longer pathologically enlarged mediastinal lymph nodes, index pretracheal node measuring 0.9 x 0.7 cm, previously 1.3 x 1.3 cm (series 2, image 20). Thyroid gland, trachea, and esophagus demonstrate no significant findings. Lungs/Pleura: Interval decrease in size of a mass of the right pulmonary apex, measuring 2.8 x 1.8 cm, previously 4.4 x 3.5 cm. 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 There are areas of significant consolidation in right middle and right lower lobes (series 6, image 80). No pleural effusion or pneumothorax. Musculoskeletal: No chest wall mass or suspicious bone lesions identified. CT ABDOMEN PELVIS FINDINGS Hepatobiliary: No solid liver abnormality is seen. No gallstones, gallbladder wall thickening, or biliary dilatation. Pancreas: Unremarkable. No pancreatic ductal dilatation or surrounding inflammatory changes. Spleen: Normal in size without significant abnormality. Adrenals/Urinary Tract: Adrenal glands are unremarkable. Kidneys are normal, without renal calculi, solid lesion, or hydronephrosis. Bladder is unremarkable. Stomach/Bowel: Stomach is within normal limits. Appendix is not clearly visualized and may be surgically absent. No evidence of bowel wall thickening, distention, or inflammatory changes. Vascular/Lymphatic: Aortic atherosclerosis. No enlarged abdominal or pelvic lymph nodes. Reproductive: Status post hysterectomy. Other: No abdominal wall hernia or abnormality. No abdominopelvic ascites. Musculoskeletal: No acute or significant osseous findings. IMPRESSION: 1. Interval decrease in size of a mass of the right pulmonary apex. 2. Slight interval decrease in size of prominent, previously FDG avid mediastinal lymph nodes. 3. 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 4. There are areas of significant consolidation in right middle and right lower lobes, which may reflect confluent metastatic disease, or alternately infectious, inflammatory, or postobstructive airspace disease. 5. Overall constellation of findings is consistent  with mixed response to treatment. 6. No evidence of metastatic disease in the abdomen or pelvis. 7. Coronary artery disease. Aortic Atherosclerosis (ICD10-I70.0). Electronically Signed   By: Eddie Candle M.D.   On: 02/06/2021 14:23   DG Chest Port 1 View  Result Date: 02/07/2021 CLINICAL DATA:  Cough.  History of lung cancer EXAM: PORTABLE CHEST 1 VIEW COMPARISON:  02/04/2021 FINDINGS: Stable heart size. Irregular nodule again noted at the right lung apex compatible with known malignancy. Moderate-sized right-sided pleural effusion, increased from prior. Patchy opacities throughout the right lung may reflect a combination of lymphangitic tumor spread and/or pneumonia. Mildly prominent interstitial markings in the left lung without focal airspace consolidation. No left-sided pleural effusion. No pneumothorax. IMPRESSION: 1. Moderate-sized right-sided pleural effusion, increased from prior. 2. Patchy opacities throughout the right lung may reflect a combination of lymphangitic tumor spread and/or pneumonia. Electronically Signed  By: Davina Poke D.O.   On: 02/07/2021 17:20   ECHOCARDIOGRAM COMPLETE  Result Date: 02/08/2021    ECHOCARDIOGRAM REPORT   Patient Name:   Allison [name] Date of Exam: 02/08/2021 Medical Rec #:  [id number]       Height:       64.0 in Accession #:    [id number]      Weight:       127.0 lb Date of Birth:  February 15, 1951       BSA:          1.613 m Patient Age:    54 years        BP:           135/71 mmHg Patient Gender: F               HR:           77 bpm. Exam Location:  Inpatient Procedure: 2D Echo, Cardiac Doppler and Color Doppler Indications:    Pulmonary Embolus I26.09  History:        Patient has prior history of Echocardiogram examinations, most                 recent 06/13/2019. CAD; Signs/Symptoms:Fever and Shortness of                 Breath. GERd. Raynaud's disease.  Sonographer:    Darlina Sicilian RDCS Referring Phys: [place]  1. Left ventricular  ejection fraction, by estimation, is 60 to 65%. The left ventricle has normal function. The left ventricle has no regional wall motion abnormalities. Left ventricular diastolic parameters were normal.  2. Right ventricular systolic function is normal. The right ventricular size is normal. There is normal pulmonary artery systolic pressure. The estimated right ventricular systolic pressure is 55.7 mmHg.  3. The mitral valve is normal in structure. Mild to moderate mitral valve regurgitation. No evidence of mitral stenosis.  4. Tricuspid valve regurgitation is mild to moderate.  5. The aortic valve is normal in structure. Aortic valve regurgitation is not visualized. No aortic stenosis is present.  6. The inferior vena cava is normal in size with greater than 50% respiratory variability, suggesting right atrial pressure of 3 mmHg. FINDINGS  Left Ventricle: Left ventricular ejection fraction, by estimation, is 60 to 65%. The left ventricle has normal function. The left ventricle has no regional wall motion abnormalities. The left ventricular internal cavity size was normal in size. There is  no left ventricular hypertrophy. Left ventricular diastolic parameters were normal. Right Ventricle: The right ventricular size is normal. No increase in right ventricular wall thickness. Right ventricular systolic function is normal. There is normal pulmonary artery systolic pressure. The tricuspid regurgitant velocity is 2.62 m/s, and  with an assumed right atrial pressure of 3 mmHg, the estimated right ventricular systolic pressure is 32.2 mmHg. Left Atrium: Left atrial size was normal in size. Right Atrium: Right atrial size was normal in size. Pericardium: There is no evidence of pericardial effusion. Mitral Valve: The mitral valve is normal in structure. Mild to moderate mitral valve regurgitation. No evidence of mitral valve stenosis. Tricuspid Valve: The tricuspid valve is normal in structure. Tricuspid valve regurgitation  is mild to moderate. No evidence of tricuspid stenosis. Aortic Valve: The aortic valve is normal in structure. Aortic valve regurgitation is not visualized. No aortic stenosis is present. Pulmonic Valve: The pulmonic valve was normal in structure. Pulmonic valve regurgitation is trivial. No evidence of pulmonic stenosis.  Aorta: The aortic root is normal in size and structure. Venous: The inferior vena cava is normal in size with greater than 50% respiratory variability, suggesting right atrial pressure of 3 mmHg. IAS/Shunts: No atrial level shunt detected by color flow Doppler.  LEFT VENTRICLE PLAX 2D LVIDd:         3.90 cm  Diastology LVIDs:         2.60 cm  LV e' medial:    9.32 cm/s LV PW:         0.60 cm  LV E/e' medial:  12.0 LV IVS:        0.70 cm  LV e' lateral:   10.30 cm/s LVOT diam:     1.80 cm  LV E/e' lateral: 10.8 LV SV:         52 LV SV Index:   32 LVOT Area:     2.54 cm  RIGHT VENTRICLE RV S prime:     11.40 cm/s TAPSE (M-mode): 2.8 cm LEFT ATRIUM             Index       RIGHT ATRIUM           Index LA diam:        3.10 cm 1.92 cm/m  RA Area:     12.30 cm LA Vol (A2C):   28.7 ml 17.79 ml/m RA Volume:   23.80 ml  14.76 ml/m LA Vol (A4C):   32.9 ml 20.40 ml/m LA Biplane Vol: 31.3 ml 19.40 ml/m  AORTIC VALVE LVOT Vmax:   97.50 cm/s LVOT Vmean:  58.400 cm/s LVOT VTI:    0.203 m  AORTA Ao Root diam: 2.70 cm Ao Asc diam:  3.00 cm MITRAL VALVE                 TRICUSPID VALVE MV Area (PHT): 3.72 cm      TR Peak grad:   27.5 mmHg MV Decel Time: 204 msec      TR Vmax:        262.00 cm/s MR Peak grad:    110.7 mmHg MR Mean grad:    81.0 mmHg   SHUNTS MR Vmax:         526.00 cm/s Systemic VTI:  0.20 m MR Vmean:        434.0 cm/s  Systemic Diam: 1.80 cm MR PISA:         0.77 cm MR PISA Eff ROA: 5 mm MR PISA Radius:  0.35 cm MV E velocity: 111.50 cm/s MV A velocity: 76.15 cm/s MV E/A ratio:  1.46 Cherlynn Kaiser MD Electronically signed by Cherlynn Kaiser MD Signature Date/Time: 02/08/2021/6:03:29 PM     Final    VAS [place] LOWER EXTREMITY VENOUS (DVT)  Result Date: 02/08/2021  Lower Venous DVT Study Patient Name:  Allison [name]  Date of Exam:   02/08/2021 Medical Rec #: [id number]        Accession #:    [id number] Date of Birth: [birth year]        Patient Gender: F Patient Age:   66 years Exam Location:  [facility] Procedure:      VAS [place] LOWER EXTREMITY VENOUS (DVT) Referring Phys: Nyoka Lint DOUTOVA --------------------------------------------------------------------------------  Indications: Pulmonary embolism. Other Indications: Patient states she has been experiencing LLE calf pain and                    heaviness in calf/foot area for about a month. Risk Factors: Cancer (Lung).  Comparison Study: No previous exams Performing Technologist: Jody Hill RVT, RDMS  Examination Guidelines: A complete evaluation includes B-mode imaging, spectral Doppler, color Doppler, and power Doppler as needed of all accessible portions of each vessel. Bilateral testing is considered an integral part of a complete examination. Limited examinations for reoccurring indications may be performed as noted. The reflux portion of the exam is performed with the patient in reverse Trendelenburg.  +---------+---------------+---------+-----------+----------+--------------+ RIGHT    CompressibilityPhasicitySpontaneityPropertiesThrombus Aging +---------+---------------+---------+-----------+----------+--------------+ CFV      Full           Yes      Yes                                 +---------+---------------+---------+-----------+----------+--------------+ SFJ      Full                                                        +---------+---------------+---------+-----------+----------+--------------+ FV Prox  Full           Yes      Yes                                 +---------+---------------+---------+-----------+----------+--------------+ FV Mid   Full           Yes      Yes                                  +---------+---------------+---------+-----------+----------+--------------+ FV DistalFull           Yes      Yes                                 +---------+---------------+---------+-----------+----------+--------------+ PFV      Full                                                        +---------+---------------+---------+-----------+----------+--------------+ POP      Full           Yes      Yes                                 +---------+---------------+---------+-----------+----------+--------------+ PTV      Full                                                        +---------+---------------+---------+-----------+----------+--------------+ PERO     Full                                                        +---------+---------------+---------+-----------+----------+--------------+   +---------+---------------+---------+-----------+----------+--------------+  LEFT     CompressibilityPhasicitySpontaneityPropertiesThrombus Aging +---------+---------------+---------+-----------+----------+--------------+ CFV      Full           Yes      Yes                                 +---------+---------------+---------+-----------+----------+--------------+ SFJ      Full                                                        +---------+---------------+---------+-----------+----------+--------------+ FV Prox  Full           Yes      Yes                                 +---------+---------------+---------+-----------+----------+--------------+ FV Mid   Full           Yes      Yes                                 +---------+---------------+---------+-----------+----------+--------------+ FV DistalFull           Yes      Yes                                 +---------+---------------+---------+-----------+----------+--------------+ PFV      Full                                                         +---------+---------------+---------+-----------+----------+--------------+ POP      Partial        No       No                   Acute          +---------+---------------+---------+-----------+----------+--------------+ PTV      None           No       No                   Acute          +---------+---------------+---------+-----------+----------+--------------+ PERO     None           No       No                   Acute          +---------+---------------+---------+-----------+----------+--------------+     Summary: BILATERAL: - No evidence of superficial venous thrombosis in the lower extremities, bilaterally. - RIGHT: - There is no evidence of deep vein thrombosis in the lower extremity.  - No cystic structure found in the popliteal fossa.  LEFT: - Findings consistent with acute deep vein thrombosis involving the left popliteal vein, left posterior tibial veins, and left peroneal veins. - A cystic structure is found in the popliteal fossa. - Ultrasound characteristics of a ruptured Baker's Cyst are noted.  *See table(s)  above for measurements and observations. Electronically signed by Deitra Mayo MD on 02/08/2021 at 2:51:48 PM.    Final    ECHOCARDIOGRAM LIMITED  Result Date: 02/13/2021    ECHOCARDIOGRAM LIMITED REPORT   Patient Name:   Allison [name] Date of Exam: 02/13/2021 Medical Rec #:  [id number]       Height:       64.0 in Accession #:    [id number]      Weight:       127.0 lb Date of Birth:  [birth year]       BSA:          1.613 m Patient Age:    59 years        BP:           138/84 mmHg Patient Gender: F               HR:           84 bpm. Exam Location:  Inpatient Procedure: Limited Echo and Color Doppler Indications:    Elevated troponin  History:        Patient has prior history of Echocardiogram examinations, most                 recent 02/08/2021. CAD. H/o DVT and pulmonary embolus.  Sonographer:    Clayton Lefort RDCS (AE) Referring Phys: [id number] [place]   1. Left ventricular ejection fraction, by estimation, is 60 to 65%. The left ventricle has normal function. The left ventricle has no regional wall motion abnormalities.  2. The mitral valve is normal in structure. Mild to moderate mitral valve regurgitation. Comparison(s): No significant change from prior study. Conclusion(s)/Recommendation(s): Limited echo for wall motion/EF, both normal and unchanged from echo 02/08/21. FINDINGS  Left Ventricle: Left ventricular ejection fraction, by estimation, is 60 to 65%. The left ventricle has normal function. The left ventricle has no regional wall motion abnormalities. The left ventricular internal cavity size was normal in size. Mitral Valve: The mitral valve is normal in structure. Mild to moderate mitral valve regurgitation. Buford Dresser MD Electronically signed by Buford Dresser MD Signature Date/Time: 02/13/2021/6:39:03 PM    Final      Subjective: - no chest pain, shortness of breath, no abdominal pain, nausea or vomiting.   Discharge Exam: BP (!) 145/79 (BP Location: Left Arm)   Pulse 72   Temp 98.4 F (36.9 C) (Oral)   Resp 16   Ht 5\' 4"  (1.626 m)   Wt 58.8 kg   LMP  (LMP Unknown)   SpO2 98%   BMI 22.25 kg/m   General: Pt is alert, awake, not in acute distress Cardiovascular: RRR, S1/S2 +, no rubs, no gallops Respiratory: CTA bilaterally, no wheezing, no rhonchi Abdominal: Soft, NT, ND, bowel sounds + Extremities: no edema, no cyanosis   The results of significant diagnostics from this hospitalization (including imaging, microbiology, ancillary and laboratory) are listed below for reference.     Microbiology: Recent Results (from the past 240 hour(s))  Culture, blood (routine x 2)     Status: None   Collection Time: 02/07/21  4:40 PM   Specimen: BLOOD  Result Value Ref Range Status   Specimen Description   Final    BLOOD RIGHT ANTECUBITAL Performed at [facility] [address],  [place], [place] [address]    Special Requests   Final    BOTTLES DRAWN AEROBIC AND ANAEROBIC Blood Culture adequate volume Performed at [facility], [place] [address] [place], [place] [address]    Culture   Final    NO GROWTH 5 DAYS Performed at [facility], [place] [address], [place], [place] [address]    Report Status 02/12/2021 FINAL  Final  Culture, blood (routine x 2)     Status: None   Collection Time: 02/07/21  4:40 PM   Specimen: BLOOD  Result Value Ref Range Status   Specimen Description   Final    BLOOD BLOOD RIGHT FOREARM Performed at [facility] [address] [place], [place] [address]    Special Requests   Final    BOTTLES DRAWN AEROBIC AND ANAEROBIC Blood Culture adequate volume Performed at [facility] [address], [place], [place] [address]    Culture   Final    NO GROWTH 5 DAYS Performed at [facility], [place] [address], [place], [place] [address]    Report Status 02/12/2021 FINAL  Final  Resp Panel by RT-PCR (Flu A&B, Covid) Nasopharyngeal Swab     Status: None   Collection Time: 02/07/21  4:40 PM   Specimen: Nasopharyngeal Swab; Nasopharyngeal(NP) swabs in vial transport medium  Result Value Ref Range Status   SARS Coronavirus 2 by RT PCR NEGATIVE NEGATIVE Final    Comment: (NOTE) SARS-CoV-2 target nucleic acids are NOT DETECTED.  The SARS-CoV-2 RNA is generally detectable in upper respiratory specimens during the acute phase of infection. The lowest concentration of SARS-CoV-2 viral copies this assay can detect is 138 copies/mL. A negative result does not preclude SARS-Cov-2 infection and should not be used as the sole basis for treatment or other patient management decisions. A negative result may occur with  improper specimen collection/handling, submission of specimen other than nasopharyngeal swab, presence of viral mutation(s) within the areas targeted by this assay, and  inadequate number of viral copies(<138 copies/mL). A negative result must be combined with clinical observations, patient history, and epidemiological information. The expected result is Negative.  Fact Sheet for Patients:  EntrepreneurPulse.com.au  Fact Sheet for Healthcare Providers:  IncredibleEmployment.be  This test is no t yet approved or cleared by the [place] FDA and  has been authorized for detection and/or diagnosis of SARS-CoV-2 by FDA under an Emergency Use Authorization (EUA). This EUA will remain  in effect (meaning this test can be used) for the duration of the COVID-19 declaration under Section 564(b)(1) of the Act, 21 U.S.C.section 360bbb-3(b)(1), unless the authorization is terminated  or revoked sooner.       Influenza A by PCR NEGATIVE NEGATIVE Final   Influenza B by PCR NEGATIVE NEGATIVE Final    Comment: (NOTE) The Xpert Xpress SARS-CoV-2/FLU/RSV plus assay is intended as an aid in the diagnosis of influenza from Nasopharyngeal swab specimens and should not be used as a sole basis for treatment. Nasal washings and aspirates are unacceptable for Xpert Xpress SARS-CoV-2/FLU/RSV testing.  Fact Sheet for Patients: EntrepreneurPulse.com.au  Fact Sheet for Healthcare Providers: IncredibleEmployment.be  This test is not yet approved or cleared by the [place] FDA and has been authorized for detection and/or diagnosis of SARS-CoV-2 by FDA under an Emergency Use Authorization (EUA). This EUA will remain in effect (meaning this test can be used) for the duration of the COVID-19 declaration under Section 564(b)(1) of the Act, 21 U.S.C. section 360bbb-3(b)(1), unless the authorization is terminated or revoked.  Performed at [facility], [place] [address] [place], [place] [address]   MRSA Next  Gen by PCR, Nasal     Status: None   Collection Time: 02/08/21  9:00  PM  Result Value Ref Range Status   MRSA by PCR Next Gen NOT DETECTED NOT DETECTED Final    Comment: (NOTE) The GeneXpert MRSA Assay (FDA approved for NASAL specimens only), is one component of a comprehensive MRSA colonization surveillance program. It is not intended to diagnose MRSA infection nor to guide or monitor treatment for MRSA infections. Test performance is not FDA approved in patients less than 26 years old. Performed at [facility], [place] [address], [place], [place] [address]   Respiratory (~20 pathogens) panel by PCR     Status: None   Collection Time: 02/08/21 11:08 PM   Specimen: Nasopharyngeal Swab; Respiratory  Result Value Ref Range Status   Adenovirus NOT DETECTED NOT DETECTED Final   Coronavirus 229E NOT DETECTED NOT DETECTED Final    Comment: (NOTE) The Coronavirus on the Respiratory Panel, DOES NOT test for the novel  Coronavirus (2019 nCoV)    Coronavirus HKU1 NOT DETECTED NOT DETECTED Final   Coronavirus NL63 NOT DETECTED NOT DETECTED Final   Coronavirus OC43 NOT DETECTED NOT DETECTED Final   Metapneumovirus NOT DETECTED NOT DETECTED Final   Rhinovirus / Enterovirus NOT DETECTED NOT DETECTED Final   Influenza A NOT DETECTED NOT DETECTED Final   Influenza B NOT DETECTED NOT DETECTED Final   Parainfluenza Virus 1 NOT DETECTED NOT DETECTED Final   Parainfluenza Virus 2 NOT DETECTED NOT DETECTED Final   Parainfluenza Virus 3 NOT DETECTED NOT DETECTED Final   Parainfluenza Virus 4 NOT DETECTED NOT DETECTED Final   Respiratory Syncytial Virus NOT DETECTED NOT DETECTED Final   Bordetella pertussis NOT DETECTED NOT DETECTED Final   Bordetella Parapertussis NOT DETECTED NOT DETECTED Final   Chlamydophila pneumoniae NOT DETECTED NOT DETECTED Final   Mycoplasma pneumoniae NOT DETECTED NOT DETECTED Final    Comment: Performed at [facility], [place]. [address], [place], [place] [address]  Resp Panel by RT-PCR (Flu A&B, Covid) Nasopharyngeal  Swab     Status: None   Collection Time: 02/12/21 12:03 AM   Specimen: Nasopharyngeal Swab; Nasopharyngeal(NP) swabs in vial transport medium  Result Value Ref Range Status   SARS Coronavirus 2 by RT PCR NEGATIVE NEGATIVE Final    Comment: (NOTE) SARS-CoV-2 target nucleic acids are NOT DETECTED.  The SARS-CoV-2 RNA is generally detectable in upper respiratory specimens during the acute phase of infection. The lowest concentration of SARS-CoV-2 viral copies this assay can detect is 138 copies/mL. A negative result does not preclude SARS-Cov-2 infection and should not be used as the sole basis for treatment or other patient management decisions. A negative result may occur with  improper specimen collection/handling, submission of specimen other than nasopharyngeal swab, presence of viral mutation(s) within the areas targeted by this assay, and inadequate number of viral copies(<138 copies/mL). A negative result must be combined with clinical observations, patient history, and epidemiological information. The expected result is Negative.  Fact Sheet for Patients:  EntrepreneurPulse.com.au  Fact Sheet for Healthcare Providers:  IncredibleEmployment.be  This test is no t yet approved or cleared by the [place] FDA and  has been authorized for detection and/or diagnosis of SARS-CoV-2 by FDA under an Emergency Use Authorization (EUA). This EUA will remain  in effect (meaning this test can be used) for the duration of the COVID-19 declaration under Section 564(b)(1) of the Act, 21 U.S.C.section 360bbb-3(b)(1), unless the authorization is terminated  or revoked sooner.       Influenza A by PCR NEGATIVE NEGATIVE Final   Influenza B by PCR NEGATIVE NEGATIVE Final    Comment: (NOTE) The Xpert Xpress SARS-CoV-2/FLU/RSV plus assay is intended as an aid in the diagnosis of influenza from Nasopharyngeal swab specimens and should not be used as a sole  basis for treatment. Nasal washings and aspirates are unacceptable for Xpert Xpress SARS-CoV-2/FLU/RSV testing.  Fact Sheet for Patients: EntrepreneurPulse.com.au  Fact Sheet for Healthcare Providers: IncredibleEmployment.be  This test is not yet approved or cleared by the [place] FDA and has been authorized for detection and/or diagnosis of SARS-CoV-2 by FDA under an Emergency Use Authorization (EUA). This EUA will remain in effect (meaning this test can be used) for the duration of the COVID-19 declaration under Section 564(b)(1) of the Act, 21 U.S.C. section 360bbb-3(b)(1), unless the authorization is terminated or revoked.  Performed at [facility], [place] [address] [place], [place] [address]      Labs: Basic Metabolic Panel: Recent Labs  Lab 02/10/21 0759 02/11/21 1811 02/12/21 1040 02/13/21 0417 02/14/21 0113 02/15/21 0731 02/16/21 0333  NA 139   < > 137 138 138 140 138  K 3.0*   < > 2.9* 4.7 4.5 3.8 4.0  CL 104   < > 106 105 104 107 107  CO2 25   < > 25 26 26 27 23   GLUCOSE 84   < > 102* 139* 136* 90 100*  BUN 14   < > 12 10 24* 20 16  CREATININE 0.90   < > 0.76 0.96 1.11* 1.05* 1.04*  CALCIUM 9.1   < > 8.0* 9.2 9.2 9.2 9.1  MG 2.2  --  1.9 2.3 2.4 2.2 2.1  PHOS 2.5  --  1.7* 2.7  --   --   --    < > = values in this interval not displayed.   Liver Function Tests: Recent Labs  Lab 02/11/21 1811 02/12/21 1040 02/13/21 0417  AST 39 29 36  ALT 43 31 46*  ALKPHOS 58 46 53  BILITOT 0.3 0.5 0.6  PROT 7.0 5.7* 6.3*  ALBUMIN 3.5 2.8* 3.0*   CBC: Recent Labs  Lab 02/11/21 1811 02/12/21 1040 02/13/21 0417 02/14/21 0113 02/15/21 0731 02/16/21 0333  WBC 9.2 10.2 6.9 11.6* 10.0 9.9  NEUTROABS 8.5* 8.9* 6.2  --   --   --   HGB 12.8 11.4* 12.1 10.5* 11.3* 10.2*  HCT 39.8 35.5* 37.1 31.7* 34.8* 31.9*  MCV 81.4 81.8 79.6* 80.3 80.6 81.6  PLT 172 152 158 165 209 183   CBG: No results for  input(s): GLUCAP in the last 168 hours. Hgb A1c No results for input(s): HGBA1C in the last 72 hours. Lipid Profile No results for input(s): CHOL, HDL, LDLCALC, TRIG, CHOLHDL, LDLDIRECT in the last 72 hours. Thyroid function studies No results for input(s): TSH, T4TOTAL, T3FREE, THYROIDAB in the last 72 hours.  Invalid input(s): FREET3 Urinalysis    Component Value Date/Time   COLORURINE STRAW (A) 02/07/2021 2050   APPEARANCEUR CLEAR 02/07/2021 2050   LABSPEC 1.005 02/07/2021 2050   PHURINE 6.0 02/07/2021 2050   GLUCOSEU NEGATIVE 02/07/2021 2050   HGBUR MODERATE (A) 02/07/2021 2050   [place] NEGATIVE 02/07/2021 2050   KETONESUR NEGATIVE 02/07/2021 2050   PROTEINUR NEGATIVE 02/07/2021 2050   NITRITE NEGATIVE 02/07/2021 2050   LEUKOCYTESUR NEGATIVE 02/07/2021 2050    FURTHER DISCHARGE INSTRUCTIONS:   Get Medicines reviewed and adjusted: Please  take all your medications with you for your next visit with your Primary MD   Laboratory/radiological data: Please request your Primary MD to go over all hospital tests and procedure/radiological results at the follow up, please ask your Primary MD to get all Hospital records sent to his/her office.   In some cases, they will be blood work, cultures and biopsy results pending at the time of your discharge. Please request that your primary care M.D. goes through all the records of your hospital data and follows up on these results.   Also Note the following: If you experience worsening of your admission symptoms, develop shortness of breath, life threatening emergency, suicidal or homicidal thoughts you must seek medical attention immediately by calling 911 or calling your MD immediately  if symptoms less severe.   You must read complete instructions/literature along with all the possible adverse reactions/side effects for all the Medicines you take and that have been prescribed to you. Take any new Medicines after you have completely  understood and accpet all the possible adverse reactions/side effects.    Do not drive when taking Pain medications or sleeping medications (Benzodaizepines)   Do not take more than prescribed Pain, Sleep and Anxiety Medications. It is not advisable to combine anxiety,sleep and pain medications without talking with your primary care practitioner   Special Instructions: If you have smoked or chewed Tobacco  in the last 2 yrs please stop smoking, stop any regular Alcohol  and or any Recreational drug use.   Wear Seat belts while driving.   Please note: You were cared for by a hospitalist during your hospital stay. Once you are discharged, your primary care physician will handle any further medical issues. Please note that NO REFILLS for any discharge medications will be authorized once you are discharged, as it is imperative that you return to your primary care physician (or establish a relationship with a primary care physician if you do not have one) for your post hospital discharge needs so that they can reassess your need for medications and monitor your lab values.  Time coordinating discharge: 40 minutes  SIGNED:  Marzetta Board, MD, PhD 02/16/2021, 1:59 PM

## 2021-02-16 NOTE — Care Management Important Message (Signed)
Important Message  Patient Details  Name: Allison [name] MRN: [id number] Date of Birth: 1950/11/10   Medicare Important Message Given:  Yes     Orbie Pyo 02/16/2021, 3:06 PM

## 2021-02-17 ENCOUNTER — Other Ambulatory Visit (HOSPITAL_COMMUNITY): Payer: Self-pay

## 2021-02-17 ENCOUNTER — Telehealth: Payer: Self-pay

## 2021-02-17 NOTE — Telephone Encounter (Signed)
Spoke with patient and scheduled an in-person Palliative Consult for 03/01/21 @ 8:30AM with Dr. [name]. Documentation will be noted in [place]. Offered patient the slot on 9/7 @ 8:30AM, but she has a oncology appointment the same time.   COVID screening was negative. No pets in home. Patient lives alone.  Consent obtained; updated Outlook/Netsmart/Team List and Epis.  Patient is aware she may be receiving a call from provider the day before or day of to confirm appointment.

## 2021-02-22 ENCOUNTER — Other Ambulatory Visit: Payer: Self-pay

## 2021-02-22 ENCOUNTER — Encounter: Payer: Self-pay | Admitting: Physical Therapy

## 2021-02-22 ENCOUNTER — Ambulatory Visit: Payer: Medicare HMO | Attending: Internal Medicine | Admitting: Physical Therapy

## 2021-02-22 VITALS — BP 179/89 | HR 76

## 2021-02-22 DIAGNOSIS — R29818 Other symptoms and signs involving the nervous system: Secondary | ICD-10-CM | POA: Diagnosis not present

## 2021-02-22 DIAGNOSIS — R2681 Unsteadiness on feet: Secondary | ICD-10-CM | POA: Diagnosis not present

## 2021-02-22 DIAGNOSIS — M6281 Muscle weakness (generalized): Secondary | ICD-10-CM | POA: Diagnosis not present

## 2021-02-22 DIAGNOSIS — I10 Essential (primary) hypertension: Secondary | ICD-10-CM

## 2021-02-22 DIAGNOSIS — R2689 Other abnormalities of gait and mobility: Secondary | ICD-10-CM | POA: Insufficient documentation

## 2021-02-22 DIAGNOSIS — Z79899 Other long term (current) drug therapy: Secondary | ICD-10-CM

## 2021-02-22 NOTE — Therapy (Addendum)
[facility] [address] [place], [place], [address] Phone: [phone]   Fax:  [phone]  Physical Therapy Evaluation  Patient Details  Name: Allison [name] MRN: [id number] Date of Birth: 09/10/1950 Referring Provider (PT): Lavina Hamman, MD   Encounter Date: 02/22/2021   PT End of Session - 02/22/21 1402     Visit Number 1    Number of Visits 13    Date for PT Re-Evaluation 05/23/21    Authorization Type [employer]    PT Start Time 1234    PT Stop Time 1317    PT Time Calculation (min) 43 min    Equipment Utilized During Treatment Gait belt    Activity Tolerance Patient limited by fatigue;Patient tolerated treatment well   limited by SOB   Behavior During Therapy [facility] for tasks assessed/performed             Past Medical History:  Diagnosis Date   Anxiety    Arthritis    Asthma    exercise induced   Depression    PMH   GERD (gastroesophageal reflux disease)    Glaucoma    History of radiation therapy 01/05/2021   IMRT right lung  11/24/2020-01/05/2021  Dr [name]   Hypertension    lung ca dx'd 11/2017   right   Malignant pleural effusion    right   PONV (postoperative nausea and vomiting)    Pre-diabetes    Raynaud's disease    Raynaud's disease     Past Surgical History:  Procedure Laterality Date   ABDOMINAL HYSTERECTOMY     partial   CHEST TUBE INSERTION Right 01/01/2018   Procedure: INSERTION PLEURAL DRAINAGE CATHETER;  Surgeon: Ivin Poot, MD;  Location: [facility];  Service: Thoracic;  Laterality: Right;   COLONOSCOPY     CORONARY STENT INTERVENTION N/A 06/16/2019   Procedure: CORONARY STENT INTERVENTION;  Surgeon: Jettie Booze, MD;  Location: [facility];  Service: Cardiovascular;  Laterality: N/A;   DILATION AND CURETTAGE OF UTERUS     EYE SURGERY     due to Glaucoma   LEFT HEART CATH AND CORONARY ANGIOGRAPHY N/A 06/16/2019   Procedure: LEFT HEART CATH AND  CORONARY ANGIOGRAPHY;  Surgeon: Jettie Booze, MD;  Location: [facility];  Service: Cardiovascular;  Laterality: N/A;   REMOVAL OF PLEURAL DRAINAGE CATHETER Right 11/07/2018   Procedure: REMOVAL OF PLEURAL DRAINAGE CATHETER;  Surgeon: Ivin Poot, MD;  Location: [facility];  Service: Thoracic;  Laterality: Right;   ROTATOR CUFF REPAIR     TUBAL LIGATION     WISDOM TOOTH EXTRACTION      Vitals:   02/22/21 1240  BP: (!) 179/89  Pulse: 76  SpO2: 98%      Subjective Assessment - 02/22/21 1243     Subjective Recent PE and DVT which she was hospitalized for and discharged 8/25 on Eliquis.  Went back to the hospital on 02/11/21 due to pt leaning to the L - MRI reveal Patchy multifocal acute to subacute ischemic infarcts involving  the bilateral cerebral and cerebellar hemispheres. Reports changes in her vision after her CVA, will be going to see an optometrist and then a neuroophamologist. Reports having SOB and pressure in her chest, which is one of her limiting factors. Goes to see her oncologist tomorrow. No falls since leaving the hospital - uses the walls and furniture to help steady for balance. notices that she is taking shorter  steps. Pt's daughter on phone throughout eval.    Patient is accompained by: --   partner, [name]   Pertinent History PMH: recent PE and DVT which she was hospitalized for and discharged 8/25 on Eliquis. HTN, lung CA , CAD, MI, glaucoma.    Limitations House hold activities;Walking;Standing    Diagnostic tests MRI reveal Patchy multifocal acute to subacute ischemic infarcts involving  the bilateral cerebral and cerebellar hemispheres.    Patient Stated Goals wants to get her balance and speed back, breathe better. likes being active.    Currently in Pain? No/denies                [facility] PT Assessment - 02/22/21 1252       Assessment   Medical Diagnosis CVA    Referring Provider (PT) Lavina Hamman, MD    Onset Date/Surgical Date 02/22/21     Hand Dominance Right    Prior Therapy none      Precautions   Precautions Fall    Precaution Comments no driving      Balance Screen   Has the patient fallen in the past 6 months No    Has the patient had a decrease in activity level because of a fear of falling?  Yes    Is the patient reluctant to leave their home because of a fear of falling?  No   always has someone with her     [place];Other (Comment)   daughter visiting from [place], partner [name] lives close   Available Help at Discharge Neighbor;Family    Type of [place] to enter    Entrance Stairs-Number of Steps 6    Entrance Stairs-Rails Can reach both    [place] One level    [place] held shower head      Prior Function   Level of [place] Retired    Leisure likes being outside, being in the garden      Observation/Other Assessments   Observations per vision assessment from OT in acute care recommended to use patch on R eye during ambulation to reduce risk of falls - pt reports that she has not been wearing the patch on her eye, but will only cover her R eye sometimes when watching TV    Focus on Therapeutic Outcomes (FOTO)  62   predicated 76     Sensation   Light Touch Appears Intact      Coordination   Gross Motor Movements are Fluid and Coordinated Yes    Finger Nose Finger Test WNL      ROM / Strength   AROM / PROM / Strength Strength      Strength   Strength Assessment Site Hip;Knee;Ankle    Right/Left Hip Right;Left    Right Hip Flexion 4+/5    Left Hip Flexion 4/5    Right/Left Knee Right;Left    Right Knee Flexion 5/5    Right Knee Extension 5/5    Left Knee Flexion 5/5    Left Knee Extension 5/5    Right/Left Ankle Left;Right    Right Ankle Dorsiflexion 4/5    Left Ankle Dorsiflexion 5/5      Transfers   Transfers Sit to  Stand;Stand to Sit    Sit to Stand 5: Supervision;Without upper extremity assist    Five time sit to stand  comments  13.56    Stand to Sit 5: Supervision;Without upper extremity assist      Ambulation/Gait   Ambulation/Gait Yes    Ambulation/Gait Assistance 5: Supervision    Ambulation/Gait Assistance Details performed an additional 20' with gait with head motions with no LOB. pt reporting feeling more SOB afterwards with cues for pursed lip breathing. HR after: 86 bpm, O2 at 98%    Ambulation Distance (Feet) --   clinic distances   Assistive device None    Gait Pattern Step-through pattern;Decreased stride length;Decreased arm swing - left;Decreased arm swing - right    Ambulation Surface Level;Indoor    Gait velocity 12.47 seconds = 2.63 ft/sec                        Objective measurements completed on examination: See above findings.               PT Education - 02/22/21 1400     Education Details BP parameters for therapy and having pt reach out to her cardiologist today regarding recent elevated BP measures (pt has been monitoring this past week), clinical findings, POC, discussed possible OT eval due to vision deficits and was recommended from acute care OT but no referral was put in - pt wants to check with her optometrist first and has an appt later this week    Person(s) Educated Patient   pt's partner in session, pt's daughter on speaker phone during eval   Methods Explanation    Comprehension Verbalized understanding              PT Short Term Goals - 02/22/21 1421       PT SHORT TERM GOAL #1   Title Pt will be independent with initial HEP in order to build upon functional gains made in therapy. ALL STGS DUE 03/15/21    Time 3    Period Weeks    Status New    Target Date 03/15/21      PT SHORT TERM GOAL #2   Title Pt will undergo further assessment of FGA with LTG written as appropriate in order to demo decr fall risk.    Baseline not  yet assessed.    Time 3    Period Weeks    Status New      PT SHORT TERM GOAL #3   Title Pt will undergo 3MWT with LTG written as appropriate in order to demo improved gait endurance.    Time 3    Period Weeks    Status New      PT SHORT TERM GOAL #4   Title Pt will verbalize understanding of fall prevention in the home.    Time 3    Period Weeks    Status New      PT SHORT TERM GOAL #5   Title Pt will improve gait speed with no AD vs. LRAD to at least 2.9 ft/sec in order to demo improved community mobility.    Baseline 2.63 ft/sec    Time 3    Period Weeks    Status New               PT Long Term Goals - 02/22/21 1423       PT LONG TERM GOAL #1   Title Pt will be independent with final HEP in order to build upon functional gains made in therapy. ALL LTGS DUE 04/05/21    Time 6  Period Weeks    Status New    Target Date 04/05/21      PT LONG TERM GOAL #2   Title FGA goal written in order to demo decr fall risk.    Baseline not yet assessed.    Time 6    Period Weeks    Status New      PT LONG TERM GOAL #3   Title 3MWT goal to be written as appropriate in order to demo improved gait endurance.    Time 6    Period Weeks    Status New      PT LONG TERM GOAL #4   Title Pt will ascend/descend 6 steps using single handrail with step through pattern with mod I in order to safely enter/exit home.    Time 6    Period Weeks    Status New      PT LONG TERM GOAL #5   Title Pt will improve gait speed with no AD vs. LRAD to at least 3.3 ft/sec in order to demo improved community mobility and gait efficiency.    Baseline 2.63 ft/sec    Time 6    Period Weeks    Status New      Additional Long Term Goals   Additional Long Term Goals Yes      PT LONG TERM GOAL #6   Title Pt will improve FOTO  to 76 in order to demo improved functional outcomes.    Baseline 62 at eval    Time 6    Period Weeks    Status New                    Plan - 02/22/21  1404     Clinical Impression Statement Patient is a 70 year old female referred to Neuro OPPT for CVA.  Pt admitted on 02/11/21 and discharged on 02/16/21 - MRI revealed patchy multifocal acute to subacute ischemic infarcts involving  the bilateral cerebral and cerebellar hemispheres.   Pt's PMH is significant for: recent PE and DVT which she was hospitalized for and discharged 8/25 on Eliquis. HTN, lung CA , CAD, MI, glaucoma.  Per OT note from acute care, pt noted to have visual deficits (is going to see optometrist later this week). The following deficits were present during the exam: decr endurance/SOB,  gait abnormalities, decr strength, impaired balance. Based on gait speed with no AD, pt is a limited community ambulator. Unable to perform further balance outcome measure today due to pt being SOB after gait (O2 and HR WNLs). Pt would benefit from skilled PT to address these impairments and functional limitations to maximize functional mobility independence    Personal Factors and Comorbidities Comorbidity 3+;Past/Current Experience;Time since onset of injury/illness/exacerbation    Comorbidities recent PE and DVT which she was hospitalized for and discharged 8/25 on Eliquis. HTN, lung CA , CAD, MI, glaucoma.    Examination-Activity Limitations Stand;Stairs;Squat;Locomotion Level;Transfers    Examination-Participation Restrictions Investment banker, operational;Yard Work   Community education officer Evolving/Moderate complexity    Clinical Decision Making Moderate    Rehab Potential Good    PT Frequency 2x / week    PT Duration 12 weeks    PT Treatment/Interventions ADLs/Self Care Home Management;DME Instruction;Gait training;Stair training;Functional mobility training;Therapeutic activities;Therapeutic exercise;Balance training;Neuromuscular re-education;Patient/family education;Energy conservation;Vestibular    PT Next Visit Plan monitor BP. perform FGA to get baseline.  initial HEP for functional BLE strength/balance (based on deficits on FGA), practice SPC  with pt for balance with gait.    Recommended Other Services possibly OT eval due to vision deficits    Consulted and Agree with Plan of Care Patient   pt's partner            Patient will benefit from skilled therapeutic intervention in order to improve the following deficits and impairments:  Abnormal gait, Cardiopulmonary status limiting activity, Decreased activity tolerance, Decreased balance, Decreased knowledge of use of DME, Decreased endurance, Difficulty walking, Decreased strength, Impaired vision/preception  Visit Diagnosis: Unsteadiness on feet - Plan: PT plan of care cert/re-cert  Muscle weakness (generalized) - Plan: PT plan of care cert/re-cert  Other abnormalities of gait and mobility - Plan: PT plan of care cert/re-cert  Other symptoms and signs involving the nervous system - Plan: PT plan of care cert/re-cert     Problem List Patient Active Problem List   Diagnosis Date Noted   Coronary artery disease of native artery of native heart with stable angina pectoris ([place])    CVA (cerebral vascular accident) ([place]) 02/12/2021   Current use of long term anticoagulation 02/12/2021   Hypokalemia 02/12/2021   Leg DVT (deep venous thromboembolism), acute, bilateral ([place]) 02/08/2021   Aortic atherosclerosis ([place]) 02/08/2021   Respiratory failure ([place]) 02/08/2021   Postobstructive pneumonia 02/07/2021   Pulmonary embolism ([place]) 02/07/2021   Elevated troponin 02/07/2021   Encounter to establish care 02/02/2021   Shortness of breath 02/02/2021   Cough 02/02/2021   Statin myopathy 12/03/2020   Acute non-recurrent frontal sinusitis 11/18/2020   NSCLC with EGFR mutation ([place]) 06/30/2020   Hx of chest tube placement 02/11/2020   History of chest tube placement 11/12/2019   Abnormal liver function 08/20/2019   Gastroesophageal reflux disease without esophagitis 08/20/2019    Generalized anxiety disorder 08/20/2019   Glaucoma 08/20/2019   Hyperlipidemia 08/20/2019   Menopausal syndrome 08/20/2019   Raynaud's phenomenon 08/20/2019   Vitamin D deficiency 08/20/2019   Status post coronary artery stent placement    History of non-ST elevation myocardial infarction (NSTEMI) 06/12/2019   Hypertension 03/24/2019   Encounter for antineoplastic chemotherapy 01/22/2018   Adenocarcinoma of right lung, stage 4 ([place]) 01/04/2018   Goals of care, counseling/discussion 01/04/2018   Thyroid nodule 12/11/2017   Pleural effusion, right 12/11/2017   Insomnia 04/20/2014    Arliss Journey, PT, DPT  02/22/2021, 2:43 PM  [facility] [address] [place], [place], [address] Phone: [phone]   Fax:  [phone]  Name: Allison [name] MRN: [id number] Date of Birth: 11-20-1950

## 2021-02-23 ENCOUNTER — Inpatient Hospital Stay: Payer: Medicare HMO | Attending: Internal Medicine

## 2021-02-23 ENCOUNTER — Other Ambulatory Visit (HOSPITAL_BASED_OUTPATIENT_CLINIC_OR_DEPARTMENT_OTHER): Payer: Self-pay

## 2021-02-23 ENCOUNTER — Other Ambulatory Visit: Payer: Self-pay | Admitting: Neurology

## 2021-02-23 ENCOUNTER — Inpatient Hospital Stay: Payer: Medicare HMO | Admitting: Internal Medicine

## 2021-02-23 VITALS — BP 192/85 | HR 87 | Temp 97.7°F | Resp 20 | Ht 64.0 in | Wt 130.2 lb

## 2021-02-23 DIAGNOSIS — Z5111 Encounter for antineoplastic chemotherapy: Secondary | ICD-10-CM

## 2021-02-23 DIAGNOSIS — C3411 Malignant neoplasm of upper lobe, right bronchus or lung: Secondary | ICD-10-CM | POA: Diagnosis present

## 2021-02-23 DIAGNOSIS — C3491 Malignant neoplasm of unspecified part of right bronchus or lung: Secondary | ICD-10-CM

## 2021-02-23 DIAGNOSIS — Z79899 Other long term (current) drug therapy: Secondary | ICD-10-CM | POA: Diagnosis not present

## 2021-02-23 DIAGNOSIS — R059 Cough, unspecified: Secondary | ICD-10-CM | POA: Diagnosis not present

## 2021-02-23 DIAGNOSIS — G893 Neoplasm related pain (acute) (chronic): Secondary | ICD-10-CM | POA: Diagnosis not present

## 2021-02-23 DIAGNOSIS — I639 Cerebral infarction, unspecified: Secondary | ICD-10-CM

## 2021-02-23 DIAGNOSIS — I10 Essential (primary) hypertension: Secondary | ICD-10-CM | POA: Diagnosis not present

## 2021-02-23 DIAGNOSIS — R0609 Other forms of dyspnea: Secondary | ICD-10-CM | POA: Insufficient documentation

## 2021-02-23 DIAGNOSIS — R0602 Shortness of breath: Secondary | ICD-10-CM | POA: Diagnosis not present

## 2021-02-23 DIAGNOSIS — C3490 Malignant neoplasm of unspecified part of unspecified bronchus or lung: Secondary | ICD-10-CM

## 2021-02-23 LAB — CMP (CANCER CENTER ONLY)
ALT: 87 U/L — ABNORMAL HIGH (ref 0–44)
AST: 29 U/L (ref 15–41)
Albumin: 3.5 g/dL (ref 3.5–5.0)
Alkaline Phosphatase: 55 U/L (ref 38–126)
Anion gap: 10 (ref 5–15)
BUN: 21 mg/dL (ref 8–23)
CO2: 25 mmol/L (ref 22–32)
Calcium: 9.8 mg/dL (ref 8.9–10.3)
Chloride: 105 mmol/L (ref 98–111)
Creatinine: 1.05 mg/dL — ABNORMAL HIGH (ref 0.44–1.00)
GFR, Estimated: 57 mL/min — ABNORMAL LOW (ref >60)
Glucose, Bld: 119 mg/dL — ABNORMAL HIGH (ref 70–99)
Potassium: 5 mmol/L (ref 3.5–5.1)
Sodium: 140 mmol/L (ref 135–145)
Total Bilirubin: 0.4 mg/dL (ref 0.3–1.2)
Total Protein: 7 g/dL (ref 6.5–8.1)

## 2021-02-23 LAB — CBC WITH DIFFERENTIAL (CANCER CENTER ONLY)
Abs Immature Granulocytes: 0.08 10*3/uL — ABNORMAL HIGH (ref 0.00–0.07)
Basophils Absolute: 0 10*3/uL (ref 0.0–0.1)
Basophils Relative: 0 %
Eosinophils Absolute: 0 10*3/uL (ref 0.0–0.5)
Eosinophils Relative: 0 %
HCT: 37.5 % (ref 36.0–46.0)
Hemoglobin: 12.1 g/dL (ref 12.0–15.0)
Immature Granulocytes: 1 %
Lymphocytes Relative: 3 %
Lymphs Abs: 0.4 10*3/uL — ABNORMAL LOW (ref 0.7–4.0)
MCH: 26.4 pg (ref 26.0–34.0)
MCHC: 32.3 g/dL (ref 30.0–36.0)
MCV: 81.9 fL (ref 80.0–100.0)
Monocytes Absolute: 0.4 10*3/uL (ref 0.1–1.0)
Monocytes Relative: 4 %
Neutro Abs: 10.8 10*3/uL — ABNORMAL HIGH (ref 1.7–7.7)
Neutrophils Relative %: 92 %
Platelet Count: 248 10*3/uL (ref 150–400)
RBC: 4.58 MIL/uL (ref 3.87–5.11)
RDW: 17.1 % — ABNORMAL HIGH (ref 11.5–15.5)
WBC Count: 11.8 10*3/uL — ABNORMAL HIGH (ref 4.0–10.5)
nRBC: 0 % (ref 0.0–0.2)

## 2021-02-23 MED ORDER — HYDROCHLOROTHIAZIDE 25 MG PO TABS: 25.0000 mg | ORAL_TABLET | Freq: Every day | ORAL | 3 refills | Status: DC

## 2021-02-23 MED ORDER — CLONIDINE HCL 0.1 MG PO TABS
0.2000 mg | ORAL_TABLET | Freq: Once | ORAL | Status: AC
  Administered 2021-02-23: 0.2 mg via ORAL

## 2021-02-23 NOTE — Addendum Note (Signed)
Addended by: Patria Mane A on: 02/23/2021 03:08 PM   Modules accepted: Orders

## 2021-02-23 NOTE — Telephone Encounter (Signed)
Recommend restarting HCTZ 25 mg daily and check BMET in 1 week

## 2021-02-23 NOTE — Progress Notes (Signed)
[place] [phone]   [phone]  OFFICE PROGRESS NOTE  Ronnell Freshwater, NP [place] [address]  DIAGNOSIS: Stage IV (T2 a,N2, M1a) non-small cell lung cancer, adenocarcinoma diagnosed in July 2019 and presented with right upper lobe lung mass in addition to mediastinal lymphadenopathy as well as bilateral pulmonary nodules and malignant right pleural effusion.  Biomarker Findings Microsatellite status - Cannot Be Determined Tumor Mutational Burden - Cannot Be Determined Genomic Findings For a complete list of the genes assayed, please refer to the Appendix. EGFR exon 19 deletion ([id number]) TP53 Y220C 7 Disease relevant genes with no reportable alterations: KRAS, ALK, BRAF, MET, RET, ERBB2, ROS1   PRIOR THERAPY:  1) Status post right Pleurx catheter placement by Dr. Prescott Gum for drainage of malignant right pleural effusion. 2) palliative radiotherapy to the enlarging right upper lobe lung mass and mediastinum under the care of Dr. Sondra Come expected to be completed on January 05, 2021.  CURRENT THERAPY: Tagrisso 80 mg p.o. daily.  First dose was given on 01/29/2018.  Status post 36 months of treatment.  INTERVAL HISTORY: Allison [name] 70 y.o. female returns to the clinic today for follow-up visit accompanied by her boyfriend.  Her daughter was available by phone during the visit.  The patient continues to complain of right chest tightness as well as shortness of breath with exertion.  She also has dry cough.  She denied having any current hemoptysis.  She has no nausea, vomiting, diarrhea or constipation.  She has no headache or visual changes.  She has no recent weight loss or night sweats.  She was admitted to the hospital few weeks ago with respiratory failure and during her hospitalization she was found to have extensive inflammatory process in the right lung suspicious for lymphangitic spread versus  radiation/Tagrisso induced pneumonitis.  The patient was also diagnosed with subacute brain infarction.  She is currently on blood pressure medication but not controlling her blood pressure which stayed high she is also currently on a tapered dose of prednisone.  The patient is here today for evaluation and recommendation regarding her condition.   MEDICAL HISTORY: Past Medical History:  Diagnosis Date   Anxiety    Arthritis    Asthma    exercise induced   Depression    PMH   GERD (gastroesophageal reflux disease)    Glaucoma    History of radiation therapy 01/05/2021   IMRT right lung  11/24/2020-01/05/2021  Dr [name]   Hypertension    lung ca dx'd 11/2017   right   Malignant pleural effusion    right   PONV (postoperative nausea and vomiting)    Pre-diabetes    Raynaud's disease    Raynaud's disease     ALLERGIES:  is allergic to penicillins, vicodin [hydrocodone-acetaminophen], and other.  MEDICATIONS:  Current Outpatient Medications  Medication Sig Dispense Refill   acetaminophen (TYLENOL) 500 MG tablet Take 1,000 mg by mouth every 6 (six) hours as needed (pain).     albuterol (VENTOLIN HFA) 108 (90 Base) MCG/ACT inhaler Inhale 2 puffs into the lungs every 6 (six) hours as needed for wheezing or shortness of breath. (Patient not taking: Reported on 02/22/2021) 1 each 2   ALPRAZolam (XANAX) 0.25 MG tablet Take 0.25 mg by mouth 2 (two) times daily as needed for anxiety.      amLODipine (NORVASC) 5 MG tablet Take 5 mg by mouth daily.  aspirin 81 MG chewable tablet Chew 1 tablet (81 mg total) by mouth daily.     b complex vitamins capsule Take 1 capsule by mouth daily.     benzonatate (TESSALON) 200 MG capsule Take 1 capsule (200 mg total) by mouth 3 (three) times daily as needed for cough. (Patient not taking: Reported on 02/11/2021) 20 capsule 0   carvedilol (COREG) 6.25 MG tablet TAKE 1 TABLET BY MOUTH 2 TIMES DAILY WITH A MEAL. PLEASE SCHEDULE APPOINTMENT FOR FUTURE  REFILLS. (Patient taking differently: Take 6.25 mg by mouth 2 (two) times daily with a meal.) 180 tablet 1   cetirizine (ZYRTEC) 5 MG tablet Take 5 mg by mouth daily.     docusate sodium (COLACE) 100 MG capsule Take 1 capsule (100 mg total) by mouth daily. 30 capsule 0   dorzolamide-timolol (COSOPT) 22.3-6.8 MG/ML ophthalmic solution Place 1 drop into both eyes 2 (two) times daily.     enoxaparin (LOVENOX) 60 MG/0.6ML injection Inject 0.6 mLs (60 mg total) into the skin every 12 (twelve) hours. 60 mL 1   esomeprazole (NEXIUM) 20 MG packet Take 20 mg by mouth daily.     ezetimibe (ZETIA) 10 MG tablet Take 10 mg by mouth daily.      guaiFENesin-dextromethorphan (ROBITUSSIN DM) 100-10 MG/5ML syrup Take 10 mLs by mouth 4 (four) times daily. 118 mL 0   HYDROcodone bit-homatropine (HYCODAN) 5-1.5 MG/5ML syrup Take 5 mLs by mouth every 4 (four) hours as needed for cough. 120 mL 0   isosorbide mononitrate (IMDUR) 30 MG 24 hr tablet Take 1 tablet (30 mg total) by mouth daily. 15 tablet 0   latanoprost (XALATAN) 0.005 % ophthalmic solution Place 1 drop into both eyes at bedtime.      menthol-cetylpyridinium (CEPACOL) 3 MG lozenge Take 1 lozenge (3 mg total) by mouth as needed for sore throat. 100 tablet 12   mometasone-formoterol (DULERA) 100-5 MCG/ACT AERO Inhale 2 puffs into the lungs 2 (two) times daily. 1 each 0   morphine 20 MG/5ML solution Take 0.6 mLs (2.4 mg total) by mouth every 4 (four) hours as needed (shortness of breath). 30 mL 0   predniSONE (DELTASONE) 10 MG tablet Take 65m daily for 3days, Take 513mdaily for 7days,Take 4055maily for 7days,Take 69m59mily for 7days,Take 20mg36mly for 7days,Take 10mg 41my for 7days, then stop 120 tablet 0   prochlorperazine (COMPAZINE) 10 MG tablet Take 1 tablet (10 mg total) by mouth every 6 (six) hours as needed for nausea or vomiting. 30 tablet 0   sucralfate (CARAFATE) 1 g tablet Take 1 tablet (1 g total) by mouth 4 (four) times daily. with meals and at  bedtime. Crush and dissolve in 10 mL of warm water prior to swallowing (Patient not taking: Reported on 02/11/2021) 120 tablet 0   VITAMIN D-VITAMIN K PO Take 1 tablet by mouth daily.     No current facility-administered medications for this visit.    SURGICAL HISTORY:  Past Surgical History:  Procedure Laterality Date   ABDOMINAL HYSTERECTOMY     partial   CHEST TUBE INSERTION Right 01/01/2018   Procedure: INSERTION PLEURAL DRAINAGE CATHETER;  Surgeon: Van TrIvin Poot Location: [facility];[place]: Thor[facility];  Laterality: Right;   COLONOSCOPY     CORONARY STENT INTERVENTION N/A 06/16/2019   Procedure: CORONARY STENT INTERVENTION;  Surgeon: VaranaJettie Booze [facility];  Service: Cardiovascular;  Laterality: N/A;   DILATION AND CURETTAGE OF UTERUS  EYE SURGERY     due to Glaucoma   LEFT HEART CATH AND CORONARY ANGIOGRAPHY N/A 06/16/2019   Procedure: LEFT HEART CATH AND CORONARY ANGIOGRAPHY;  Surgeon: Jettie Booze, MD;  Location: [facility];  Service: Cardiovascular;  Laterality: N/A;   REMOVAL OF PLEURAL DRAINAGE CATHETER Right 11/07/2018   Procedure: REMOVAL OF PLEURAL DRAINAGE CATHETER;  Surgeon: Ivin Poot, MD;  Location: [facility];  Service: Thoracic;  Laterality: Right;   ROTATOR CUFF REPAIR     TUBAL LIGATION     WISDOM TOOTH EXTRACTION      REVIEW OF SYSTEMS:  Constitutional: positive for fatigue Eyes: negative Ears, nose, mouth, throat, and face: negative Respiratory: positive for cough, dyspnea on exertion, and pleurisy/chest pain Cardiovascular: negative Gastrointestinal: negative Genitourinary:negative Integument/breast: negative Hematologic/lymphatic: negative Musculoskeletal:positive for arthralgias Neurological: negative Behavioral/Psych: negative Endocrine: negative Allergic/Immunologic: negative   PHYSICAL EXAMINATION: General appearance: alert, cooperative, fatigued, and no distress Head: Normocephalic,  without obvious abnormality, atraumatic Neck: no adenopathy, no JVD, supple, symmetrical, trachea midline, and thyroid not enlarged, symmetric, no tenderness/mass/nodules Lymph nodes: Cervical, supraclavicular, and axillary nodes normal. Resp: rales RLL Back: symmetric, no curvature. ROM normal. No CVA tenderness. Cardio: regular rate and rhythm, S1, S2 normal, no murmur, click, rub or gallop GI: soft, non-tender; bowel sounds normal; no masses,  no organomegaly Extremities: extremities normal, atraumatic, no cyanosis or edema Neurologic: Alert and oriented X 3, normal strength and tone. Normal symmetric reflexes. Normal coordination and gait  ECOG PERFORMANCE STATUS: 1 - Symptomatic but completely ambulatory  Blood pressure (!) 205/97, pulse 87, temperature 97.7 F (36.5 C), temperature source Tympanic, resp. rate 20, height '5\' 4"'  (1.626 m), weight 130 lb 3.2 oz (59.1 kg), SpO2 96 %.  LABORATORY DATA: Lab Results  Component Value Date   WBC 11.8 (H) 02/23/2021   HGB 12.1 02/23/2021   HCT 37.5 02/23/2021   MCV 81.9 02/23/2021   PLT 248 02/23/2021      Chemistry      Component Value Date/Time   NA 138 02/16/2021 0333   NA 140 09/13/2020 0847   K 4.0 02/16/2021 0333   CL 107 02/16/2021 0333   CO2 23 02/16/2021 0333   BUN 16 02/16/2021 0333   BUN 10 09/13/2020 0847   CREATININE 1.04 (H) 02/16/2021 0333   CREATININE 0.98 02/04/2021 0928      Component Value Date/Time   CALCIUM 9.1 02/16/2021 0333   ALKPHOS 53 02/13/2021 0417   AST 36 02/13/2021 0417   AST 30 02/04/2021 0928   ALT 46 (H) 02/13/2021 0417   ALT 27 02/04/2021 0928   BILITOT 0.6 02/13/2021 0417   BILITOT 0.5 02/04/2021 0928       RADIOGRAPHIC STUDIES: CT ABDOMEN PELVIS WO CONTRAST  Result Date: 02/12/2021 CLINICAL DATA:  Abdominal pain. EXAM: CT ABDOMEN AND PELVIS WITHOUT CONTRAST TECHNIQUE: Multidetector CT imaging of the abdomen and pelvis was performed following the standard protocol without IV  contrast. COMPARISON:  CT abdomen pelvis dated/CT dated 10/11/2020. FINDINGS: Evaluation of this exam is limited in the absence of intravenous contrast. Lower chest: There is consolidative changes of the majority of the visualized right lung base. A partially seen right-sided pleural effusion and diffusely thickened appearance of the right pleural, likely representing malignant effusion and pleural implants. Innumerable nodules within the left lung base may represent metastatic disease although an atypical infection is not excluded. These findings are new compared to the prior CT of 10/11/2020 most consistent with progression of metastatic disease. There  is coronary vascular calcification. No intra-abdominal free air or free fluid. Hepatobiliary: No focal liver abnormality is seen. No gallstones, gallbladder wall thickening, or biliary dilatation. Pancreas: Unremarkable. No pancreatic ductal dilatation or surrounding inflammatory changes. Spleen: Normal in size without focal abnormality. Adrenals/Urinary Tract: Adrenal glands are unremarkable. Kidneys are normal, without renal calculi, focal lesion, or hydronephrosis. Bladder is unremarkable. Stomach/Bowel: Small scattered sigmoid diverticula without active inflammatory changes. There is moderate stool throughout the colon. There is no bowel obstruction or active inflammation. The appendix is not visualized with certainty. No inflammatory changes identified in the right lower quadrant. Vascular/Lymphatic: Mild aortoiliac atherosclerotic disease the IVC is unremarkable. Portal venous gas. There is no adenopathy. Reproductive: Hysterectomy.  No adnexal masses. Other: None Musculoskeletal: Degenerative changes of the spine. There is a transitional vertebra. There is disc desiccation and vacuum phenomena at L5-S1. Lower lumbar facet arthropathy. No acute osseous pathology. IMPRESSION: 1. No acute intra-abdominal or pelvic pathology. No evidence of metastatic disease  within the abdomen or pelvis. 2. Colonic diverticulosis. No bowel obstruction. 3. Progression of metastatic disease in the lung bases. 4. Aortic Atherosclerosis (ICD10-I70.0). Electronically Signed   By: Anner Crete M.D.   On: 02/12/2021 20:39   CT ANGIO HEAD W OR WO CONTRAST  Result Date: 02/13/2021 CLINICAL DATA:  Follow-up examination for acute stroke. EXAM: CT ANGIOGRAPHY HEAD AND NECK TECHNIQUE: Multidetector CT imaging of the head and neck was performed using the standard protocol during bolus administration of intravenous contrast. Multiplanar CT image reconstructions and MIPs were obtained to evaluate the vascular anatomy. Carotid stenosis measurements (when applicable) are obtained utilizing NASCET criteria, using the distal internal carotid diameter as the denominator. CONTRAST:  52m OMNIPAQUE IOHEXOL 350 MG/ML SOLN COMPARISON:  Prior MRI from 02/11/2021 and CT from 02/12/2021. FINDINGS: CTA NECK FINDINGS Aortic arch: Visualized aortic arch normal in caliber with normal 3 vessel morphology. No stenosis seen about the origin of the great vessels. Right carotid system: Right common and internal carotid arteries patent without stenosis, dissection or occlusion. Left carotid system: Left common and internal carotid arteries patent without stenosis, dissection, or occlusion. Mild eccentric calcified plaque about the left bifurcation without significant stenosis. Vertebral arteries: Both vertebral arteries arise from the subclavian arteries. Dominant right vertebral artery widely patent within the neck without abnormality. Left vertebral artery patent proximally, but becomes increasingly attenuated as it courses cephalad, and occludes by the level of the skull base. This could reflect an underlying dissection. Skeleton: No visible acute osseous finding. No discrete or worrisome osseous lesions. Degenerative spondylosis noted at C4-5 through C6-7 without high-grade spinal stenosis. Degenerative changes  noted about the left TMJ. Other neck: No other acute soft tissue abnormality within the neck. No mass or adenopathy. Few scattered right thyroid nodules noted, largest of which measures 11 mm. Findings of doubtful significance given size and patient age, no follow-up imaging recommended (ref: J Am Coll Radiol. 2015 Feb;12(2): 143-50). Upper chest: Previously identified spiculated mass at the right lung apex again seen, similar to previous. Circumferential pleuroparenchymal thickening about the right lung with probable loculated fluid along the right major fissure, partially visualized, and concerning for pleural involvement of disease. Diffusely irregular nodularity and interlobular septal thickening again seen, consistent with lymphangitic spread of tumor. Irregular nodularity and ground-glass opacity within the visualized left lung could be related to lymphangitic spread of tumor and/or infection. Review of the MIP images confirms the above findings CTA HEAD FINDINGS Anterior circulation: Petrous segments patent bilaterally. Mild atheromatous change within the carotid  siphons without hemodynamically significant stenosis. Tiny 2 mm outpouching arising from the cavernous left ICA could reflect a small aneurysm versus vascular infundibulum (series 6, image 225). Left A1 segment widely patent. Right A1 hypoplastic and/or absent. Normal anterior communicating artery complex. Both ACAs patent to their distal aspects, with the left being dominant. No M1 stenosis or occlusion. Normal MCA bifurcations. Distal MCA branches perfused and symmetric, with no visible proximal MCA branch occlusion. Posterior circulation: Right vertebral artery patent to the vertebrobasilar junction without stenosis. Right PICA patent. Left vertebral artery occluded at the skull base. Minimal retrograde filling into the distal left V4 segment across the vertebrobasilar junction noted (series 6, image 209). Left PICA not visualized. Basilar patent  to its distal aspect without stenosis. Superior cerebral arteries patent bilaterally. Left PCA supplied via the basilar. Right PCA supplied via the basilar as well as a prominent right posterior communicating artery. PCAs patent to their distal aspects without stenosis. Venous sinuses: Grossly patent allowing for timing the contrast bolus. Anatomic variants: None significant. Review of the MIP images confirms the above findings IMPRESSION: 1. Increasing attenuation within the left vertebral artery as it courses cephalad within the neck, with occlusion at the skull base. Finding is age indeterminate, but could reflect changes of an underlying dissection. 2. Mild atheromatous change elsewhere about the major arterial vasculature of the head and neck. No other large vessel occlusion. No other proximal high-grade or correctable stenosis. 3. 2 mm outpouching arising from the cavernous left ICA, which could reflect a small aneurysm versus vascular infundibulum. 4. Spiculated right apical mass, consistent with known malignancy. Diffuse pleuroparenchymal thickening about the right lung with scattered interlobular septal thickening and nodularity within the remainder of the visualized lungs, concerning for lymphangitic spread of tumor. Superimposed infection could be considered in the correct clinical setting. Electronically Signed   By: Jeannine Boga M.D.   On: 02/13/2021 02:54   DG Chest 2 View  Result Date: 02/13/2021 CLINICAL DATA:  Shortness of breath for several weeks. History of lung cancer. EXAM: CHEST - 2 VIEW COMPARISON:  Chest x-ray dated 02/07/2021. Chest CT dated 02/07/2021. FINDINGS: Heart size and mediastinal contours are stable. Dense opacity within the RIGHT lower lung is unchanged, corresponding to the mix of consolidations and pleural effusion better demonstrated on earlier CT. Additional mild interstitial prominence throughout the RIGHT upper lobe and LEFT lung is stable. Spiculated mass within  the RIGHT lung apex is better demonstrated on earlier chest CT. IMPRESSION: 1. Stable chest x-ray. 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 2. Interstitial prominence bilaterally, suspicious for diffuse lymphangitic spread of malignancy. 3. Spiculated mass within the RIGHT lung apex is better demonstrated on earlier chest CT of 02/07/2021. Electronically Signed   By: Franki Cabot M.D.   On: 02/13/2021 15:25   CT HEAD WO CONTRAST (5MM)  Result Date: 02/12/2021 CLINICAL DATA:  Evaluate for possible bleed. EXAM: CT HEAD WITHOUT CONTRAST TECHNIQUE: Contiguous axial images were obtained from the base of the skull through the vertex without intravenous contrast. COMPARISON:  Head CT dated 02/11/2021. FINDINGS: Brain: Similar appearance of an ill-defined small hypodense focus in the right cerebellum. The ventricles and sulci are otherwise appropriate size for patient's age. There is no acute intracranial hemorrhage. No mass effect or midline shift no extra-axial fluid collection. Vascular: No  hyperdense vessel or unexpected calcification. Skull: Normal. Negative for fracture or focal lesion. Sinuses/Orbits: No acute finding. Other: None IMPRESSION: Similar appearance of an ill-defined small hypodense focus in the right cerebellum. No acute intracranial hemorrhage. Electronically Signed   By: Anner Crete M.D.   On: 02/12/2021 04:00   CT Head Wo Contrast  Result Date: 02/11/2021 CLINICAL DATA:  Sensation of everything in body being pulled to the left EXAM: CT HEAD WITHOUT CONTRAST TECHNIQUE: Contiguous axial images were obtained from the base of the skull through the vertex without intravenous contrast. COMPARISON:  MR brain dated 11/09/2020 FINDINGS: Brain: No evidence  of acute infarction, hemorrhage, hydrocephalus, extra-axial collection or mass effect. However, there is a subtle possible 8 mm hypodense in the right cerebellum (series 2/image 9), not evident on prior MR, raising concern for small acute/subacute infarct or possibly a metastasis given the history of prior lung cancer. Vascular: No hyperdense vessel or unexpected calcification. Skull: Normal. Negative for fracture or focal lesion. Sinuses/Orbits: The visualized paranasal sinuses are essentially clear. The mastoid air cells are unopacified. Other: None. IMPRESSION: Possible 8 mm lesion in the right cerebellum, equivocal. This was not evident on prior MRI, raising concern for small acute/subacute infarct or possibly a metastasis. Consider MRI for further characterization. Electronically Signed   By: Julian Hy M.D.   On: 02/11/2021 19:11   CT ANGIO NECK W OR WO CONTRAST  Result Date: 02/13/2021 CLINICAL DATA:  Follow-up examination for acute stroke. EXAM: CT ANGIOGRAPHY HEAD AND NECK TECHNIQUE: Multidetector CT imaging of the head and neck was performed using the standard protocol during bolus administration of intravenous contrast. Multiplanar CT image reconstructions and MIPs were obtained to evaluate the vascular anatomy. Carotid stenosis measurements (when applicable) are obtained utilizing NASCET criteria, using the distal internal carotid diameter as the denominator. CONTRAST:  34m OMNIPAQUE IOHEXOL 350 MG/ML SOLN COMPARISON:  Prior MRI from 02/11/2021 and CT from 02/12/2021. FINDINGS: CTA NECK FINDINGS Aortic arch: Visualized aortic arch normal in caliber with normal 3 vessel morphology. No stenosis seen about the origin of the great vessels. Right carotid system: Right common and internal carotid arteries patent without stenosis, dissection or occlusion. Left carotid system: Left common and internal carotid arteries patent without stenosis, dissection, or occlusion. Mild eccentric calcified plaque  about the left bifurcation without significant stenosis. Vertebral arteries: Both vertebral arteries arise from the subclavian arteries. Dominant right vertebral artery widely patent within the neck without abnormality. Left vertebral artery patent proximally, but becomes increasingly attenuated as it courses cephalad, and occludes by the level of the skull base. This could reflect an underlying dissection. Skeleton: No visible acute osseous finding. No discrete or worrisome osseous lesions. Degenerative spondylosis noted at C4-5 through C6-7 without high-grade spinal stenosis. Degenerative changes noted about the left TMJ. Other neck: No other acute soft tissue abnormality within the neck. No mass or adenopathy. Few scattered right thyroid nodules noted, largest of which measures 11 mm. Findings of doubtful significance given size and patient age, no follow-up imaging recommended (ref: J Am Coll Radiol. 2015 Feb;12(2): 143-50). Upper chest: Previously identified spiculated mass at the right lung apex again seen, similar to previous. Circumferential pleuroparenchymal thickening about the right lung with probable loculated fluid along the right major fissure, partially visualized, and concerning for pleural involvement of disease. Diffusely irregular nodularity and interlobular septal thickening again seen, consistent with lymphangitic spread of tumor. Irregular nodularity and ground-glass opacity within the visualized left lung could be related to lymphangitic spread of tumor  and/or infection. Review of the MIP images confirms the above findings CTA HEAD FINDINGS Anterior circulation: Petrous segments patent bilaterally. Mild atheromatous change within the carotid siphons without hemodynamically significant stenosis. Tiny 2 mm outpouching arising from the cavernous left ICA could reflect a small aneurysm versus vascular infundibulum (series 6, image 225). Left A1 segment widely patent. Right A1 hypoplastic and/or  absent. Normal anterior communicating artery complex. Both ACAs patent to their distal aspects, with the left being dominant. No M1 stenosis or occlusion. Normal MCA bifurcations. Distal MCA branches perfused and symmetric, with no visible proximal MCA branch occlusion. Posterior circulation: Right vertebral artery patent to the vertebrobasilar junction without stenosis. Right PICA patent. Left vertebral artery occluded at the skull base. Minimal retrograde filling into the distal left V4 segment across the vertebrobasilar junction noted (series 6, image 209). Left PICA not visualized. Basilar patent to its distal aspect without stenosis. Superior cerebral arteries patent bilaterally. Left PCA supplied via the basilar. Right PCA supplied via the basilar as well as a prominent right posterior communicating artery. PCAs patent to their distal aspects without stenosis. Venous sinuses: Grossly patent allowing for timing the contrast bolus. Anatomic variants: None significant. Review of the MIP images confirms the above findings IMPRESSION: 1. Increasing attenuation within the left vertebral artery as it courses cephalad within the neck, with occlusion at the skull base. Finding is age indeterminate, but could reflect changes of an underlying dissection. 2. Mild atheromatous change elsewhere about the major arterial vasculature of the head and neck. No other large vessel occlusion. No other proximal high-grade or correctable stenosis. 3. 2 mm outpouching arising from the cavernous left ICA, which could reflect a small aneurysm versus vascular infundibulum. 4. Spiculated right apical mass, consistent with known malignancy. Diffuse pleuroparenchymal thickening about the right lung with scattered interlobular septal thickening and nodularity within the remainder of the visualized lungs, concerning for lymphangitic spread of tumor. Superimposed infection could be considered in the correct clinical setting. Electronically  Signed   By: Jeannine Boga M.D.   On: 02/13/2021 02:54   CT Chest W Contrast  Result Date: 02/06/2021 CLINICAL DATA:  Non-small cell lung cancer staging, ongoing [place] EXAM: CT CHEST, ABDOMEN, AND PELVIS WITH CONTRAST TECHNIQUE: Multidetector CT imaging of the chest, abdomen and pelvis was performed following the standard protocol during bolus administration of intravenous contrast. CONTRAST:  84m OMNIPAQUE IOHEXOL 350 MG/ML SOLN, additional oral enteric contrast COMPARISON:  PET-CT, 11/09/2020 FINDINGS: CT CHEST FINDINGS Cardiovascular: Aortic atherosclerosis. Normal heart size. Right coronary artery calcifications and stents. No pericardial effusion. Mediastinum/Nodes: Slight interval decrease in size of prominent, although no longer pathologically enlarged mediastinal lymph nodes, index pretracheal node measuring 0.9 x 0.7 cm, previously 1.3 x 1.3 cm (series 2, image 20). Thyroid gland, trachea, and esophagus demonstrate no significant findings. Lungs/Pleura: Interval decrease in size of a mass of the right pulmonary apex, measuring 2.8 x 1.8 cm, previously 4.4 x 3.5 cm. 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 There are areas of significant consolidation in right middle and right lower lobes (series 6, image 80). No pleural effusion or pneumothorax. Musculoskeletal: No chest wall mass or suspicious bone lesions identified. CT ABDOMEN PELVIS FINDINGS Hepatobiliary: No solid liver abnormality is seen. No gallstones, gallbladder wall thickening, or biliary dilatation. Pancreas: Unremarkable. No pancreatic ductal dilatation  or surrounding inflammatory changes. Spleen: Normal in size without significant abnormality. Adrenals/Urinary Tract: Adrenal glands are  unremarkable. Kidneys are normal, without renal calculi, solid lesion, or hydronephrosis. Bladder is unremarkable. Stomach/Bowel: Stomach is within normal limits. Appendix is not clearly visualized and may be surgically absent. No evidence of bowel wall thickening, distention, or inflammatory changes. Vascular/Lymphatic: Aortic atherosclerosis. No enlarged abdominal or pelvic lymph nodes. Reproductive: Status post hysterectomy. Other: No abdominal wall hernia or abnormality. No abdominopelvic ascites. Musculoskeletal: No acute or significant osseous findings. IMPRESSION: 1. Interval decrease in size of a mass of the right pulmonary apex. 2. Slight interval decrease in size of prominent, previously FDG avid mediastinal lymph nodes. 3. 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 4. There are areas of significant consolidation in right middle and right lower lobes, which may reflect confluent metastatic disease, or alternately infectious, inflammatory, or postobstructive airspace disease. 5. Overall constellation of findings is consistent with mixed response to treatment. 6. No evidence of metastatic disease in the abdomen or pelvis. 7. Coronary artery disease. Aortic Atherosclerosis (ICD10-I70.0). Electronically Signed   By: Eddie Candle M.D.   On: 02/06/2021 14:23   CT Angio Chest Pulmonary Embolism (PE) W or WO Contrast  Addendum Date: 02/07/2021   ADDENDUM REPORT: 02/07/2021 22:40 ADDENDUM: These results were called by telephone at the time of interpretation on 02/07/2021 at 10:39 pm to provider [facility] , who verbally acknowledged these results. Electronically Signed   By: Fidela Salisbury M.D.   On: 02/07/2021 22:40   Result Date: 02/07/2021 CLINICAL DATA:  PE suspected, high prob. Dyspnea.  Non-small cell lung cancer EXAM: CT ANGIOGRAPHY CHEST WITH CONTRAST TECHNIQUE: Multidetector CT imaging of the chest was performed using the standard protocol during bolus administration of intravenous contrast. Multiplanar CT image reconstructions and MIPs were obtained to evaluate the vascular anatomy. CONTRAST:  63m OMNIPAQUE IOHEXOL 350 MG/ML SOLN COMPARISON:  02/04/2021 FINDINGS: Cardiovascular: There is adequate opacification of the pulmonary arterial tree. There is a central intraluminal branching filling defect within the posterior basal segmental pulmonary artery of the left lower lobe in keeping with acute pulmonary embolism. The embolic burden is small. The central pulmonary arteries are not enlarged. There is no CT evidence of right heart strain. Mild coronary artery calcification. Global cardiac size within normal limits. No pericardial effusion. Mild atherosclerotic calcification is seen within the thoracic aorta. No aortic aneurysm. Mediastinum/Nodes: Rind like soft tissue along the paramediastinal pleural surface is indistinguishable from infiltrative soft tissue within the right paratracheal and subcarinal mediastinum though this may be in part related to radiographic technique. No pathologic thoracic adenopathy. The esophagus is unremarkable. Visualized thyroid unremarkable. Lungs/Pleura: Spiculated mass within the right apex is unchanged measuring 19 x 29 mm at axial image # 32/6. There is progressive consolidation within the a basilar right middle lobe and right lower lobe as well as irregular, nodular interlobular septal thickening in keeping with lymphangitic spread of malignancy. Superimposed infection within the right lung base is difficult to exclude. Small right pleural effusion is present. Rind like soft tissue involving the right hemithorax which demonstrates volume loss and compared to into the left may represent pleural metastatic disease but is not well characterized on this  examination. Nodular, irregular septal thickening within the left upper lobe is not as well characterized on this examination due to motion  artifact, but appears stable since prior examination, again compatible with changes of lymphangitic spread of malignancy. No pneumothorax. No pleural effusion on the left. Central airways are patent. Upper Abdomen: No acute abnormality. Musculoskeletal: No lytic or blastic bone lesion. No acute bone abnormality. Review of the MIP images confirms the above findings. IMPRESSION: Acute pulmonary embolism. Embolic burden is small. No CT evidence of right heart strain. Progressive consolidation within the right lower lobe. While peribronchial consolidation likely represents a component of lymphangitic spread of malignancy, superimposed infection is difficult to exclude and is suspected given the relatively rapid progression of right lower lobe, in particular, consolidation. Small right pleural effusion. Rind like soft tissue within the right hemithorax with associated right-sided volume loss suspicious for pleural metastatic disease. Mild coronary artery calcification Aortic Atherosclerosis (ICD10-I70.0). Attempts are being made at this time to contact the managing clinician for direct verbal communication of these findings. Electronically Signed: By: Fidela Salisbury M.D. On: 02/07/2021 21:35   MR Brain W and Wo Contrast  Result Date: 02/11/2021 CLINICAL DATA:  Initial evaluation for neuro deficit, question CVA versus metastatic disease. History of stage IV lung cancer. EXAM: MRI HEAD WITHOUT AND WITH CONTRAST TECHNIQUE: Multiplanar, multiecho pulse sequences of the brain and surrounding structures were obtained without and with intravenous contrast. CONTRAST:  59m GADAVIST GADOBUTROL 1 MMOL/ML IV SOLN COMPARISON:  Head CT from earlier the same day as well as previous MRI from 11/09/2020. FINDINGS: Brain: Cerebral volume within normal limits for age. Patchy T2/FLAIR signal  abnormality seen involving the periventricular and deep white matter both cerebral hemispheres, nonspecific, but most likely related to chronic microvascular ischemic disease. Multiple scattered foci of diffusion abnormality are seen involving the cerebellum bilaterally, left slightly worse than right (series 5, images 56, 59). Additional patchy multifocal areas of diffusion abnormality seen involving the cortical and subcortical aspect of both cerebral hemispheres, most pronounced at the left occipital lobe (series 5, images 84, 82, 79, 69, 72). Foci involving the frontal parietal regions are somewhat watershed in distribution. Findings consistent with acute to subacute ischemic infarcts. Small focus of intrinsic T1 hyperintensity with susceptibility artifact present at the right cerebellum, consistent with petechial blood products (series 11, image 35 and series 9, image 14). No frank hemorrhagic transformation. No visible hemorrhage on prior CT. Additionally, scattered foci of patchy enhancement seen about several of these areas of ischemia, felt to be most consistent with probable evolving subacute ischemic change (series 15, images 123, 83, 75, 68). No other definite mass lesion or definite evidence for intracranial metastatic disease. No mass effect or midline shift. No hydrocephalus or extra-axial fluid collection. Empty sella noted. Midline structures intact. Vascular: Abnormal flow void within the left vertebral artery, which could be related to slow flow and/or occlusion (series 10, image 2). Enhancement is seen within the left vertebral artery following contrast administration. Major intracranial vascular flow voids are otherwise maintained. Skull and upper cervical spine: Craniocervical junction within normal limits. Bone marrow signal intensity normal. No focal marrow replacing lesion. No scalp soft tissue abnormality. Sinuses/Orbits: Globes and orbital soft tissues within normal limits. Paranasal  sinuses are largely clear. No mastoid effusion. Inner ear structures grossly normal. Other: None. IMPRESSION: 1. Patchy multifocal acute to subacute ischemic infarcts involving the bilateral cerebral and cerebellar hemispheres. Associated small volume petechial blood products at the right cerebellum without frank hemorrhagic transformation. No significant mass effect. 2. Associated scattered areas of patchy enhancement as above, favored to be secondary to subacute ischemic changes. A short  interval follow-up MRI to ensure these changes resolve is recommended, particularly given the patient history of stage IV lung cancer and to ensure no underlying metastatic disease is present. 3. Abnormal flow void within the left vertebral artery, which could be related to slow flow and/or occlusion. 4. Underlying chronic microvascular ischemic disease. Electronically Signed   By: Jeannine Boga M.D.   On: 02/11/2021 23:11   CT Abdomen Pelvis W Contrast  Result Date: 02/06/2021 CLINICAL DATA:  Non-small cell lung cancer staging, ongoing [place] EXAM: CT CHEST, ABDOMEN, AND PELVIS WITH CONTRAST TECHNIQUE: Multidetector CT imaging of the chest, abdomen and pelvis was performed following the standard protocol during bolus administration of intravenous contrast. CONTRAST:  64m OMNIPAQUE IOHEXOL 350 MG/ML SOLN, additional oral enteric contrast COMPARISON:  PET-CT, 11/09/2020 FINDINGS: CT CHEST FINDINGS Cardiovascular: Aortic atherosclerosis. Normal heart size. Right coronary artery calcifications and stents. No pericardial effusion. Mediastinum/Nodes: Slight interval decrease in size of prominent, although no longer pathologically enlarged mediastinal lymph nodes, index pretracheal node measuring 0.9 x 0.7 cm, previously 1.3 x 1.3 cm (series 2, image 20). Thyroid gland, trachea, and esophagus demonstrate no significant findings. Lungs/Pleura: Interval decrease in size of a mass of the right pulmonary apex, measuring 2.8 x  1.8 cm, previously 4.4 x 3.5 cm. 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 There are areas of significant consolidation in right middle and right lower lobes (series 6, image 80). No pleural effusion or pneumothorax. Musculoskeletal: No chest wall mass or suspicious bone lesions identified. CT ABDOMEN PELVIS FINDINGS Hepatobiliary: No solid liver abnormality is seen. No gallstones, gallbladder wall thickening, or biliary dilatation. Pancreas: Unremarkable. No pancreatic ductal dilatation or surrounding inflammatory changes. Spleen: Normal in size without significant abnormality. Adrenals/Urinary Tract: Adrenal glands are unremarkable. Kidneys are normal, without renal calculi, solid lesion, or hydronephrosis. Bladder is unremarkable. Stomach/Bowel: Stomach is within normal limits. Appendix is not clearly visualized and may be surgically absent. No evidence of bowel wall thickening, distention, or inflammatory changes. Vascular/Lymphatic: Aortic atherosclerosis. No enlarged abdominal or pelvic lymph nodes. Reproductive: Status post hysterectomy. Other: No abdominal wall hernia or abnormality. No abdominopelvic ascites. Musculoskeletal: No acute or significant osseous findings. IMPRESSION: 1. Interval decrease in size of a mass of the right pulmonary apex. 2. Slight interval decrease in size of prominent, previously FDG avid mediastinal lymph nodes. 3. 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 4. There are areas of significant consolidation in right middle and right lower lobes,  which may reflect confluent metastatic disease, or alternately infectious, inflammatory, or postobstructive airspace disease. 5. Overall constellation of findings is consistent with mixed response to treatment. 6. No evidence of metastatic disease in the abdomen or pelvis. 7. Coronary artery disease. Aortic Atherosclerosis (ICD10-I70.0). Electronically Signed   By: AEddie CandleM.D.   On: 02/06/2021 14:23   DG Chest Port 1 View  Result Date: 02/07/2021 CLINICAL DATA:  Cough.  History of lung cancer EXAM: PORTABLE CHEST 1 VIEW COMPARISON:  02/04/2021 FINDINGS: Stable heart size. Irregular nodule again noted at the right lung apex compatible with known malignancy. Moderate-sized right-sided pleural effusion, increased from prior. Patchy opacities throughout the right lung may  reflect a combination of lymphangitic tumor spread and/or pneumonia. Mildly prominent interstitial markings in the left lung without focal airspace consolidation. No left-sided pleural effusion. No pneumothorax. IMPRESSION: 1. Moderate-sized right-sided pleural effusion, increased from prior. 2. Patchy opacities throughout the right lung may reflect a combination of lymphangitic tumor spread and/or pneumonia. Electronically Signed   By: Davina Poke D.O.   On: 02/07/2021 17:20   ECHOCARDIOGRAM COMPLETE  Result Date: 02/08/2021    ECHOCARDIOGRAM REPORT   Patient Name:   JOSANNE BOEREMA Homan Date of Exam: 02/08/2021 Medical Rec #:  [id number]       Height:       64.0 in Accession #:    [id number]      Weight:       127.0 lb Date of Birth:  11/05/1950       BSA:          1.613 m Patient Age:    69 years        BP:           135/71 mmHg Patient Gender: F               HR:           77 bpm. Exam Location:  Inpatient Procedure: 2D Echo, Cardiac Doppler and Color Doppler Indications:    Pulmonary Embolus I26.09  History:        Patient has prior history of Echocardiogram examinations, most                 recent 06/13/2019. CAD;  Signs/Symptoms:Fever and Shortness of                 Breath. GERd. Raynaud's disease.  Sonographer:    Darlina Sicilian RDCS Referring Phys: [place]  1. Left ventricular ejection fraction, by estimation, is 60 to 65%. The left ventricle has normal function. The left ventricle has no regional wall motion abnormalities. Left ventricular diastolic parameters were normal.  2. Right ventricular systolic function is normal. The right ventricular size is normal. There is normal pulmonary artery systolic pressure. The estimated right ventricular systolic pressure is 86.5 mmHg.  3. The mitral valve is normal in structure. Mild to moderate mitral valve regurgitation. No evidence of mitral stenosis.  4. Tricuspid valve regurgitation is mild to moderate.  5. The aortic valve is normal in structure. Aortic valve regurgitation is not visualized. No aortic stenosis is present.  6. The inferior vena cava is normal in size with greater than 50% respiratory variability, suggesting right atrial pressure of 3 mmHg. FINDINGS  Left Ventricle: Left ventricular ejection fraction, by estimation, is 60 to 65%. The left ventricle has normal function. The left ventricle has no regional wall motion abnormalities. The left ventricular internal cavity size was normal in size. There is  no left ventricular hypertrophy. Left ventricular diastolic parameters were normal. Right Ventricle: The right ventricular size is normal. No increase in right ventricular wall thickness. Right ventricular systolic function is normal. There is normal pulmonary artery systolic pressure. The tricuspid regurgitant velocity is 2.62 m/s, and  with an assumed right atrial pressure of 3 mmHg, the estimated right ventricular systolic pressure is 78.4 mmHg. Left Atrium: Left atrial size was normal in size. Right Atrium: Right atrial size was normal in size. Pericardium: There is no evidence of pericardial effusion. Mitral Valve: The mitral valve  is normal in structure. Mild to moderate mitral valve regurgitation. No evidence of mitral valve stenosis. Tricuspid Valve: The  tricuspid valve is normal in structure. Tricuspid valve regurgitation is mild to moderate. No evidence of tricuspid stenosis. Aortic Valve: The aortic valve is normal in structure. Aortic valve regurgitation is not visualized. No aortic stenosis is present. Pulmonic Valve: The pulmonic valve was normal in structure. Pulmonic valve regurgitation is trivial. No evidence of pulmonic stenosis. Aorta: The aortic root is normal in size and structure. Venous: The inferior vena cava is normal in size with greater than 50% respiratory variability, suggesting right atrial pressure of 3 mmHg. IAS/Shunts: No atrial level shunt detected by color flow Doppler.  LEFT VENTRICLE PLAX 2D LVIDd:         3.90 cm  Diastology LVIDs:         2.60 cm  LV e' medial:    9.32 cm/s LV PW:         0.60 cm  LV E/e' medial:  12.0 LV IVS:        0.70 cm  LV e' lateral:   10.30 cm/s LVOT diam:     1.80 cm  LV E/e' lateral: 10.8 LV SV:         52 LV SV Index:   32 LVOT Area:     2.54 cm  RIGHT VENTRICLE RV S prime:     11.40 cm/s TAPSE (M-mode): 2.8 cm LEFT ATRIUM             Index       RIGHT ATRIUM           Index LA diam:        3.10 cm 1.92 cm/m  RA Area:     12.30 cm LA Vol (A2C):   28.7 ml 17.79 ml/m RA Volume:   23.80 ml  14.76 ml/m LA Vol (A4C):   32.9 ml 20.40 ml/m LA Biplane Vol: 31.3 ml 19.40 ml/m  AORTIC VALVE LVOT Vmax:   97.50 cm/s LVOT Vmean:  58.400 cm/s LVOT VTI:    0.203 m  AORTA Ao Root diam: 2.70 cm Ao Asc diam:  3.00 cm MITRAL VALVE                 TRICUSPID VALVE MV Area (PHT): 3.72 cm      TR Peak grad:   27.5 mmHg MV Decel Time: 204 msec      TR Vmax:        262.00 cm/s MR Peak grad:    110.7 mmHg MR Mean grad:    81.0 mmHg   SHUNTS MR Vmax:         526.00 cm/s Systemic VTI:  0.20 m MR Vmean:        434.0 cm/s  Systemic Diam: 1.80 cm MR PISA:         0.77 cm MR PISA Eff ROA: 5 mm MR PISA  Radius:  0.35 cm MV E velocity: 111.50 cm/s MV A velocity: 76.15 cm/s MV E/A ratio:  1.46 Cherlynn Kaiser MD Electronically signed by Cherlynn Kaiser MD Signature Date/Time: 02/08/2021/6:03:29 PM    Final    VAS [place] LOWER EXTREMITY VENOUS (DVT)  Result Date: 02/08/2021  Lower Venous DVT Study Patient Name:  TEDDI BADALAMENTI  Date of Exam:   02/08/2021 Medical Rec #: [id number]        Accession #:    [id number] Date of Birth: 28-May-1951        Patient Gender: F Patient Age:   68 years Exam Location:  [facility] Procedure:      VAS  [place] LOWER EXTREMITY VENOUS (DVT) Referring Phys: Nyoka Lint DOUTOVA --------------------------------------------------------------------------------  Indications: Pulmonary embolism. Other Indications: Patient states she has been experiencing LLE calf pain and                    heaviness in calf/foot area for about a month. Risk Factors: Cancer (Lung). Comparison Study: No previous exams Performing Technologist: Jody Hill RVT, RDMS  Examination Guidelines: A complete evaluation includes B-mode imaging, spectral Doppler, color Doppler, and power Doppler as needed of all accessible portions of each vessel. Bilateral testing is considered an integral part of a complete examination. Limited examinations for reoccurring indications may be performed as noted. The reflux portion of the exam is performed with the patient in reverse Trendelenburg.  +---------+---------------+---------+-----------+----------+--------------+ RIGHT    CompressibilityPhasicitySpontaneityPropertiesThrombus Aging +---------+---------------+---------+-----------+----------+--------------+ CFV      Full           Yes      Yes                                 +---------+---------------+---------+-----------+----------+--------------+ SFJ      Full                                                        +---------+---------------+---------+-----------+----------+--------------+ FV Prox  Full            Yes      Yes                                 +---------+---------------+---------+-----------+----------+--------------+ FV Mid   Full           Yes      Yes                                 +---------+---------------+---------+-----------+----------+--------------+ FV DistalFull           Yes      Yes                                 +---------+---------------+---------+-----------+----------+--------------+ PFV      Full                                                        +---------+---------------+---------+-----------+----------+--------------+ POP      Full           Yes      Yes                                 +---------+---------------+---------+-----------+----------+--------------+ PTV      Full                                                        +---------+---------------+---------+-----------+----------+--------------+  PERO     Full                                                        +---------+---------------+---------+-----------+----------+--------------+   +---------+---------------+---------+-----------+----------+--------------+ LEFT     CompressibilityPhasicitySpontaneityPropertiesThrombus Aging +---------+---------------+---------+-----------+----------+--------------+ CFV      Full           Yes      Yes                                 +---------+---------------+---------+-----------+----------+--------------+ SFJ      Full                                                        +---------+---------------+---------+-----------+----------+--------------+ FV Prox  Full           Yes      Yes                                 +---------+---------------+---------+-----------+----------+--------------+ FV Mid   Full           Yes      Yes                                 +---------+---------------+---------+-----------+----------+--------------+ FV DistalFull           Yes      Yes                                  +---------+---------------+---------+-----------+----------+--------------+ PFV      Full                                                        +---------+---------------+---------+-----------+----------+--------------+ POP      Partial        No       No                   Acute          +---------+---------------+---------+-----------+----------+--------------+ PTV      None           No       No                   Acute          +---------+---------------+---------+-----------+----------+--------------+ PERO     None           No       No                   Acute          +---------+---------------+---------+-----------+----------+--------------+     Summary: BILATERAL: - No evidence of superficial venous thrombosis in the lower extremities, bilaterally. - RIGHT: - There is no  evidence of deep vein thrombosis in the lower extremity.  - No cystic structure found in the popliteal fossa.  LEFT: - Findings consistent with acute deep vein thrombosis involving the left popliteal vein, left posterior tibial veins, and left peroneal veins. - A cystic structure is found in the popliteal fossa. - Ultrasound characteristics of a ruptured Baker's Cyst are noted.  *See table(s) above for measurements and observations. Electronically signed by Deitra Mayo MD on 02/08/2021 at 2:51:48 PM.    Final    ECHOCARDIOGRAM LIMITED  Result Date: 02/13/2021    ECHOCARDIOGRAM LIMITED REPORT   Patient Name:   ROONEY GLADWIN Kretzschmar Date of Exam: 02/13/2021 Medical Rec #:  [id number]       Height:       64.0 in Accession #:    [id number]      Weight:       127.0 lb Date of Birth:  11/13/1950       BSA:          1.613 m Patient Age:    89 years        BP:           138/84 mmHg Patient Gender: F               HR:           84 bpm. Exam Location:  Inpatient Procedure: Limited Echo and Color Doppler Indications:    Elevated troponin  History:        Patient has prior history of Echocardiogram examinations, most                  recent 02/08/2021. CAD. H/o DVT and pulmonary embolus.  Sonographer:    Clayton Lefort RDCS (AE) Referring Phys: [id number] [place]  1. Left ventricular ejection fraction, by estimation, is 60 to 65%. The left ventricle has normal function. The left ventricle has no regional wall motion abnormalities.  2. The mitral valve is normal in structure. Mild to moderate mitral valve regurgitation. Comparison(s): No significant change from prior study. Conclusion(s)/Recommendation(s): Limited echo for wall motion/EF, both normal and unchanged from echo 02/08/21. FINDINGS  Left Ventricle: Left ventricular ejection fraction, by estimation, is 60 to 65%. The left ventricle has normal function. The left ventricle has no regional wall motion abnormalities. The left ventricular internal cavity size was normal in size. Mitral Valve: The mitral valve is normal in structure. Mild to moderate mitral valve regurgitation. Buford Dresser MD Electronically signed by Buford Dresser MD Signature Date/Time: 02/13/2021/6:39:03 PM    Final      ASSESSMENT AND PLAN: This is a very pleasant 70 years old never smoker African-American female recently with a stage IV non-small cell lung cancer, adenocarcinoma with positive EGFR mutation with deletion in exon 65. The patient was started on treatment with Tagrisso 80 mg p.o. daily status post 35 months of treatment. She has been tolerating this treatment well with no concerning adverse effects except for intermittent diarrhea. She had repeat CT scan of the chest, abdomen pelvis performed recently.  I personally and independently reviewed the scans and discussed the results with the patient and her boyfriend today. Unfortunately the CT scan showed interval progression of the right apical lung mass in addition to progression of mediastinal lymphadenopathy concerning for worsening of her disease. She has no actionable resistant mutation on the molecular  studies performed by [facility]. The patient continued her current treatment with Tagrisso and tolerating it fairly well. She underwent  palliative radiotherapy to the enlarging right upper lobe lung mass in addition to the mediastinal lymphadenopathy under the care of Dr. Sondra Come completed January 05, 2021. Her treatment with [facility] Nip has been on hold for the last few weeks secondary to suspicious radiation/Tagrisso induced pneumonitis but this could be also consistent with lymphangitic spread of the tumor. She is currently on a tapered dose of prednisone and tolerating it fairly well.  She is currently on 50 mg p.o. daily. I recommended for the patient to continue with the current tapered dose of prednisone. I will repeat CT scan of the chest next week for further evaluation of her disease and to evaluate the response to the steroid treatment before resuming Tagrisso. If the patient has improvement in the pneumonitis, we will resume her treatment with Tagrisso but if there is progression of the inflammatory/lymphangitic spread, we will may consider her for treatment with systemic chemotherapy. For the hypertension she was advised to take her blood pressure medication as prescribed and to discuss with her cardiologist and primary care physician adjustment of her medications.  We will give her a dose of clonidine 0.2 mg p.o. x1 today. For the history of the stroke, she will follow with neurology for recommendation regarding her condition. The patient will come back for follow-up visit in 1 week for evaluation and more discussion of her treatment options based on the results of the scan. She was advised to call immediately if she has any other concerning symptoms in the interval. The patient voices understanding of current disease status and treatment options and is in agreement with the current care plan. All questions were answered. The patient knows to call the clinic with any problems, questions or  concerns. We can certainly see the patient much sooner if necessary. The total time spent in the appointment was 40 minutes.  Disclaimer: This note was dictated with voice recognition software. Similar sounding words can inadvertently be transcribed and may not be corrected upon review.

## 2021-02-24 ENCOUNTER — Telehealth: Payer: Self-pay | Admitting: Neurology

## 2021-02-24 ENCOUNTER — Inpatient Hospital Stay: Payer: Medicare HMO

## 2021-02-24 ENCOUNTER — Inpatient Hospital Stay: Payer: Medicare HMO | Admitting: Internal Medicine

## 2021-02-24 DIAGNOSIS — I639 Cerebral infarction, unspecified: Secondary | ICD-10-CM | POA: Diagnosis not present

## 2021-02-24 DIAGNOSIS — H35371 Puckering of macula, right eye: Secondary | ICD-10-CM | POA: Diagnosis not present

## 2021-02-24 DIAGNOSIS — H532 Diplopia: Secondary | ICD-10-CM | POA: Diagnosis not present

## 2021-02-24 NOTE — Telephone Encounter (Signed)
[employer] medicare order sent to [facility]. They will obtain the auth and reach out to the patient to schedule.  °

## 2021-02-27 ENCOUNTER — Ambulatory Visit
Admission: RE | Admit: 2021-02-27 | Discharge: 2021-02-27 | Disposition: A | Discharge status: Home / Self Care | Payer: Medicare HMO | Source: Ambulatory Visit | Attending: Neurology | Admitting: Neurology

## 2021-02-27 ENCOUNTER — Other Ambulatory Visit: Payer: Self-pay

## 2021-02-27 DIAGNOSIS — I639 Cerebral infarction, unspecified: Secondary | ICD-10-CM

## 2021-02-27 MED ORDER — GADOBENATE DIMEGLUMINE 529 MG/ML IV SOLN
10.0000 mL | Freq: Once | INTRAVENOUS | Status: AC | PRN
  Administered 2021-02-27: 10 mL via INTRAVENOUS

## 2021-02-28 ENCOUNTER — Encounter (INDEPENDENT_AMBULATORY_CARE_PROVIDER_SITE_OTHER): Payer: Self-pay | Admitting: Ophthalmology

## 2021-02-28 ENCOUNTER — Ambulatory Visit (INDEPENDENT_AMBULATORY_CARE_PROVIDER_SITE_OTHER): Payer: Medicare HMO | Admitting: Ophthalmology

## 2021-02-28 DIAGNOSIS — H2512 Age-related nuclear cataract, left eye: Secondary | ICD-10-CM | POA: Diagnosis not present

## 2021-02-28 DIAGNOSIS — H35371 Puckering of macula, right eye: Secondary | ICD-10-CM

## 2021-02-28 DIAGNOSIS — H2511 Age-related nuclear cataract, right eye: Secondary | ICD-10-CM | POA: Diagnosis not present

## 2021-02-28 HISTORY — DX: Age-related nuclear cataract, right eye: H25.11

## 2021-02-28 NOTE — Assessment & Plan Note (Signed)
Moderate cataract is present in the left eye.  Should cataract surgery be undertaken in the right eye, it is often necessary to perform cataract surgery and subsequent eye so that both eyes are balanced prescription wise

## 2021-02-28 NOTE — Assessment & Plan Note (Signed)
The nature of cataract was discussed with the patient as well as the elective nature of surgery. The patient was reassured that surgery at a later date does not put the patient at risk for a worse outcome. It was emphasized that the need for surgery is dictated by the patient's quality of life as influenced by the cataract. Patient was instructed to maintain close follow up with their general eye care doctor.  Moderate nuclear sclerotic cataract is present in each eye, yet in the right eye she has symptomatic blurred vision.  With the presence of the retina of an epiretinal membrane that could be contributing to her blurred vision, I have explained to the patient and family that cataract extraction with intraocular lens implantation initially is the first step to recover vision.  If your vision recover sufficiently no further attention need be given to the epiretinal membrane.  The epiretinal membrane however could continue to call blurred vision post cataract surgery and if so reevaluation could be undertaken in this office for consideration of surgical removal of the epiretinal membrane.

## 2021-02-28 NOTE — Progress Notes (Signed)
02/28/2021     CHIEF COMPLAINT Patient presents for  Chief Complaint  Patient presents with   Retina Evaluation      HISTORY OF PRESENT ILLNESS: Allison [name] is a 70 y.o. female who presents to the clinic today for:   HPI     Retina Evaluation   In both eyes.        Comments   New patient retinal evaluation for ERM OD referred by Dr. Katy Fitch.  Pt c/o a ghosting of her central vision mostly with the right eye and also with both eyes open, started within the month, new glasses have not helped. Pt c/o floaters for ~ 3 weeks, unsure which eye. Pt denies any FOL. Pt denies any dark curtain/veil. Pt had recent strokes on 02/10/21; Bilateral cerebellum and left occipital lobe.   Eye Meds: Cosopt BID OU Latanoprost QHS OU      Last edited by Reather Littler, COA on 02/28/2021  9:42 AM.      Referring physician: Clent Jacks, MD [address] [place],  [place] [address]  HISTORICAL INFORMATION:   Selected notes from the MEDICAL RECORD NUMBER    Lab Results  Component Value Date   HGBA1C 5.7 (H) 02/12/2021     CURRENT MEDICATIONS: Current Outpatient Medications (Ophthalmic Drugs)  Medication Sig   dorzolamide-timolol (COSOPT) 22.3-6.8 MG/ML ophthalmic solution Place 1 drop into both eyes 2 (two) times daily.   latanoprost (XALATAN) 0.005 % ophthalmic solution Place 1 drop into both eyes at bedtime.    No current facility-administered medications for this visit. (Ophthalmic Drugs)   Current Outpatient Medications (Other)  Medication Sig   acetaminophen (TYLENOL) 500 MG tablet Take 1,000 mg by mouth every 6 (six) hours as needed (pain).   albuterol (VENTOLIN HFA) 108 (90 Base) MCG/ACT inhaler Inhale 2 puffs into the lungs every 6 (six) hours as needed for wheezing or shortness of breath. (Patient not taking: Reported on 02/22/2021)   ALPRAZolam (XANAX) 0.25 MG tablet Take 0.25 mg by mouth 2 (two) times daily as needed for anxiety.    amLODipine (NORVASC) 5 MG  tablet Take 5 mg by mouth daily.   aspirin 81 MG chewable tablet Chew 1 tablet (81 mg total) by mouth daily.   b complex vitamins capsule Take 1 capsule by mouth daily.   benzonatate (TESSALON) 200 MG capsule Take 1 capsule (200 mg total) by mouth 3 (three) times daily as needed for cough. (Patient not taking: Reported on 02/11/2021)   carvedilol (COREG) 6.25 MG tablet TAKE 1 TABLET BY MOUTH 2 TIMES DAILY WITH A MEAL. PLEASE SCHEDULE APPOINTMENT FOR FUTURE REFILLS. (Patient taking differently: Take 6.25 mg by mouth 2 (two) times daily with a meal.)   cetirizine (ZYRTEC) 5 MG tablet Take 5 mg by mouth daily.   docusate sodium (COLACE) 100 MG capsule Take 1 capsule (100 mg total) by mouth daily.   enoxaparin (LOVENOX) 60 MG/0.6ML injection Inject 0.6 mLs (60 mg total) into the skin every 12 (twelve) hours.   esomeprazole (NEXIUM) 20 MG packet Take 20 mg by mouth daily.   ezetimibe (ZETIA) 10 MG tablet Take 10 mg by mouth daily.    guaiFENesin-dextromethorphan (ROBITUSSIN DM) 100-10 MG/5ML syrup Take 10 mLs by mouth 4 (four) times daily.   hydrochlorothiazide (HYDRODIURIL) 25 MG tablet Take 1 tablet (25 mg total) by mouth daily.   HYDROcodone bit-homatropine (HYCODAN) 5-1.5 MG/5ML syrup Take 5 mLs by mouth every 4 (four) hours as needed for cough.  isosorbide mononitrate (IMDUR) 30 MG 24 hr tablet Take 1 tablet (30 mg total) by mouth daily.   menthol-cetylpyridinium (CEPACOL) 3 MG lozenge Take 1 lozenge (3 mg total) by mouth as needed for sore throat.   mometasone-formoterol (DULERA) 100-5 MCG/ACT AERO Inhale 2 puffs into the lungs 2 (two) times daily.   morphine 20 MG/5ML solution Take 0.6 mLs (2.4 mg total) by mouth every 4 (four) hours as needed (shortness of breath).   predniSONE (DELTASONE) 10 MG tablet Take 60mg  daily for 3days, Take 50mg  daily for 7days,Take 40mg  daily for 7days,Take 30mg  daily for 7days,Take 20mg  daily for 7days,Take 10mg  daily for 7days, then stop   prochlorperazine  (COMPAZINE) 10 MG tablet Take 1 tablet (10 mg total) by mouth every 6 (six) hours as needed for nausea or vomiting.   sucralfate (CARAFATE) 1 g tablet Take 1 tablet (1 g total) by mouth 4 (four) times daily. with meals and at bedtime. Crush and dissolve in 10 mL of warm water prior to swallowing (Patient not taking: Reported on 02/11/2021)   VITAMIN D-VITAMIN K PO Take 1 tablet by mouth daily.   No current facility-administered medications for this visit. (Other)      REVIEW OF SYSTEMS:    ALLERGIES Allergies  Allergen Reactions   Penicillins Other (See Comments)    SYNCOPE PATIENT HAS HAD A PCN REACTION WITH IMMEDIATE RASH, FACIAL/TONGUE/THROAT SWELLING, SOB, OR LIGHTHEADEDNESS WITH HYPOTENSION:  #  #  YES  #  # Has patient had a PCN reaction causing severe rash involving mucus membranes or skin necrosis: No Has patient had a PCN reaction that required hospitalization: No Has patient had a PCN reaction occurring within the last 10 years: No    Vicodin [Hydrocodone-Acetaminophen] Other (See Comments)    Sped up heart and breathing   Other Other (See Comments)    Glaucoma eye drop    PAST MEDICAL HISTORY Past Medical History:  Diagnosis Date   Anxiety    Arthritis    Asthma    exercise induced   Depression    PMH   GERD (gastroesophageal reflux disease)    Glaucoma    History of radiation therapy 01/05/2021   IMRT right lung  11/24/2020-01/05/2021  Dr [name]   Hypertension    lung ca dx'd 11/2017   right   Malignant pleural effusion    right   PONV (postoperative nausea and vomiting)    Pre-diabetes    Raynaud's disease    Raynaud's disease    Past Surgical History:  Procedure Laterality Date   ABDOMINAL HYSTERECTOMY     partial   CHEST TUBE INSERTION Right 01/01/2018   Procedure: INSERTION PLEURAL DRAINAGE CATHETER;  Surgeon: Ivin Poot, MD;  Location: [facility];  Service: Thoracic;  Laterality: Right;   COLONOSCOPY     CORONARY STENT INTERVENTION N/A  06/16/2019   Procedure: CORONARY STENT INTERVENTION;  Surgeon: Jettie Booze, MD;  Location: [facility];  Service: Cardiovascular;  Laterality: N/A;   DILATION AND CURETTAGE OF UTERUS     EYE SURGERY     due to Glaucoma   LEFT HEART CATH AND CORONARY ANGIOGRAPHY N/A 06/16/2019   Procedure: LEFT HEART CATH AND CORONARY ANGIOGRAPHY;  Surgeon: Jettie Booze, MD;  Location: [facility];  Service: Cardiovascular;  Laterality: N/A;   REMOVAL OF PLEURAL DRAINAGE CATHETER Right 11/07/2018   Procedure: REMOVAL OF PLEURAL DRAINAGE CATHETER;  Surgeon: Ivin Poot, MD;  Location: [facility];  Service:  Thoracic;  Laterality: Right;   ROTATOR CUFF REPAIR     TUBAL LIGATION     WISDOM TOOTH EXTRACTION      FAMILY HISTORY Family History  Problem Relation Age of Onset   Heart disease Sister    Heart disease Brother    Lung cancer Other     SOCIAL HISTORY Social History   Tobacco Use   Smoking status: Never   Smokeless tobacco: Never  Vaping Use   Vaping Use: Never used  Substance Use Topics   Alcohol use: Not Currently    Comment: up to 3 drinks per week   Drug use: No    Comment: CBD oil          OPHTHALMIC EXAM:  Base Eye Exam     Visual Acuity (ETDRS)       Right Left   Dist cc 20/20 20/20    Correction: Glasses  Pt reports the distortion while checking [facility] in OD        Tonometry (Tonopen, 9:49 AM)       Right Left   Pressure 20 24         Pupils       Pupils Dark Light Shape React APD   Right PERRL 4 3 Round Brisk None   Left PERRL 4 3 Round Brisk None         Visual Fields (Counting fingers)       Left Right    Full Full  Pt reports better definition with OS        Extraocular Movement       Right Left    Full, Ortho Full, Ortho         Neuro/Psych     Oriented x3: Yes   Mood/Affect: Normal         Dilation     Both eyes: 1.0% Mydriacyl, 2.5% Phenylephrine @ 9:49 AM           Slit Lamp and Fundus  Exam     External Exam       Right Left   External Normal Normal         Slit Lamp Exam       Right Left   Lids/Lashes Normal Normal   Conjunctiva/Sclera White and quiet White and quiet   Cornea Clear Clear   Anterior Chamber Deep and quiet Deep and quiet   Iris Round and reactive Round and reactive   Lens 2+ Nuclear sclerosis 2+ Nuclear sclerosis   Anterior Vitreous Normal Normal         Fundus Exam       Right Left   Posterior Vitreous Normal Normal   Disc Normal Normal   C/D Ratio 0.65 0.65   Macula Epiretinal membrane moderate topo distortion Normal   Vessels Normal Normal   Periphery Normal Normal            IMAGING AND PROCEDURES  Imaging and Procedures for 02/28/21  Color Fundus Photography Optos - OU - Both Eyes       Right Eye Progression has no prior data. Disc findings include normal observations, increased cup to disc ratio. Macula : epiretinal membrane. Vessels : normal observations. Periphery : normal observations.   Left Eye Progression has no prior data. Disc findings include increased cup to disc ratio. Macula : normal observations. Vessels : normal observations. Periphery : normal observations.   Notes Myopic topographic distortion OD  ASSESSMENT/PLAN:  Nuclear sclerotic cataract of right eye The nature of cataract was discussed with the patient as well as the elective nature of surgery. The patient was reassured that surgery at a later date does not put the patient at risk for a worse outcome. It was emphasized that the need for surgery is dictated by the patient's quality of life as influenced by the cataract. Patient was instructed to maintain close follow up with their general eye care doctor.  Moderate nuclear sclerotic cataract is present in each eye, yet in the right eye she has symptomatic blurred vision.  With the presence of the retina of an epiretinal membrane that could be contributing to her blurred vision, I  have explained to the patient and family that cataract extraction with intraocular lens implantation initially is the first step to recover vision.  If your vision recover sufficiently no further attention need be given to the epiretinal membrane.  The epiretinal membrane however could continue to call blurred vision post cataract surgery and if so reevaluation could be undertaken in this office for consideration of surgical removal of the epiretinal membrane.  Epiretinal membrane, right eye OD, moderate topographic distortion and thickening with preserved acuity 2020 yet with visual symptoms.  Therefore I do recommend routine cataract surgery be performed prior to any potential retinal surgery.  Upon performance of cataract surgery in the development of pseudophakia OD of acuity is satisfactory no further intervention would be required  Nuclear sclerotic cataract of left eye Moderate cataract is present in the left eye.  Should cataract surgery be undertaken in the right eye, it is often necessary to perform cataract surgery and subsequent eye so that both eyes are balanced prescription wise     ICD-10-CM   1. Epiretinal membrane, right eye  H35.371 Color Fundus Photography Optos - OU - Both Eyes    2. Nuclear sclerotic cataract of right eye  H25.11     3. Nuclear sclerotic cataract of left eye  H25.12       1.  Recommend follow-up with Dr. Nathanial Rancher for preparation for cataract surgery in the right eye to improve and maximize visual acuity.  If acuity improves no further intervention, no need to address the epiretinal membrane would be required.  If however persistent blurred or twisted or distorted vision were present, vitrectomy membrane peel could be discussed further and offered to the patient  2.  Patient a understands the need for potential sequential cataract surgery once visual acuity has improved in the right eye so as to balance the 2 eyes and the delightful side effect of losing  losing the need for spectacle correction at distance  3.  Ophthalmic Meds Ordered this visit:  No orders of the defined types were placed in this encounter.      Return in about 6 months (around 08/28/2021) for Follow-up as needed if symptoms do not improve or, DILATE OU, OCT.  There are no Patient Instructions on file for this visit.   Explained the diagnoses, plan, and follow up with the patient and they expressed understanding.  Patient expressed understanding of the importance of proper follow up care.   Clent Demark Eleisha Branscomb M.D. Diseases & Surgery of the Retina and Vitreous Retina & Diabetic [place] 02/28/21     Abbreviations: M myopia (nearsighted); A astigmatism; H hyperopia (farsighted); P presbyopia; Mrx spectacle prescription;  CTL contact lenses; OD right eye; OS left eye; OU both eyes  XT exotropia; ET esotropia; PEK punctate epithelial  keratitis; PEE punctate epithelial erosions; DES dry eye syndrome; MGD meibomian gland dysfunction; ATs artificial tears; PFAT's preservative free artificial tears; [place] nuclear sclerotic cataract; PSC posterior subcapsular cataract; ERM epi-retinal membrane; PVD posterior vitreous detachment; RD retinal detachment; DM diabetes mellitus; DR diabetic retinopathy; NPDR non-proliferative diabetic retinopathy; PDR proliferative diabetic retinopathy; CSME clinically significant macular edema; DME diabetic macular edema; dbh dot blot hemorrhages; CWS cotton wool spot; POAG primary open angle glaucoma; C/D cup-to-disc ratio; HVF humphrey visual field; GVF goldmann visual field; OCT optical coherence tomography; IOP intraocular pressure; BRVO Branch retinal vein occlusion; CRVO central retinal vein occlusion; CRAO central retinal artery occlusion; BRAO branch retinal artery occlusion; RT retinal tear; SB scleral buckle; PPV pars plana vitrectomy; VH Vitreous hemorrhage; PRP panretinal laser photocoagulation; IVK intravitreal kenalog; VMT vitreomacular  traction; MH Macular hole;  NVD neovascularization of the disc; NVE neovascularization elsewhere; AREDS age related eye disease study; ARMD age related macular degeneration; POAG primary open angle glaucoma; EBMD epithelial/anterior basement membrane dystrophy; ACIOL anterior chamber intraocular lens; IOL intraocular lens; PCIOL posterior chamber intraocular lens; Phaco/IOL phacoemulsification with intraocular lens placement; [place] photorefractive keratectomy; LASIK laser assisted in situ keratomileusis; HTN hypertension; DM diabetes mellitus; COPD chronic obstructive pulmonary disease

## 2021-02-28 NOTE — Assessment & Plan Note (Signed)
OD, moderate topographic distortion and thickening with preserved acuity 2020 yet with visual symptoms.  Therefore I do recommend routine cataract surgery be performed prior to any potential retinal surgery.  Upon performance of cataract surgery in the development of pseudophakia OD of acuity is satisfactory no further intervention would be required

## 2021-03-01 ENCOUNTER — Ambulatory Visit: Payer: Medicare HMO

## 2021-03-01 ENCOUNTER — Other Ambulatory Visit: Payer: Self-pay

## 2021-03-01 VITALS — BP 164/84 | HR 89

## 2021-03-01 DIAGNOSIS — Z515 Encounter for palliative care: Secondary | ICD-10-CM | POA: Diagnosis not present

## 2021-03-01 DIAGNOSIS — R2689 Other abnormalities of gait and mobility: Secondary | ICD-10-CM | POA: Diagnosis not present

## 2021-03-01 DIAGNOSIS — I10 Essential (primary) hypertension: Secondary | ICD-10-CM | POA: Diagnosis not present

## 2021-03-01 DIAGNOSIS — R2242 Localized swelling, mass and lump, left lower limb: Secondary | ICD-10-CM | POA: Diagnosis not present

## 2021-03-01 DIAGNOSIS — R2681 Unsteadiness on feet: Secondary | ICD-10-CM

## 2021-03-01 DIAGNOSIS — M6281 Muscle weakness (generalized): Secondary | ICD-10-CM | POA: Diagnosis not present

## 2021-03-01 DIAGNOSIS — R29818 Other symptoms and signs involving the nervous system: Secondary | ICD-10-CM | POA: Diagnosis not present

## 2021-03-01 DIAGNOSIS — R630 Anorexia: Secondary | ICD-10-CM | POA: Diagnosis not present

## 2021-03-01 DIAGNOSIS — C3491 Malignant neoplasm of unspecified part of right bronchus or lung: Secondary | ICD-10-CM | POA: Diagnosis not present

## 2021-03-01 DIAGNOSIS — R0600 Dyspnea, unspecified: Secondary | ICD-10-CM | POA: Diagnosis not present

## 2021-03-01 NOTE — Therapy (Signed)
[facility] [address] [place], [place], [address] Phone: [phone]   Fax:  [phone]  Physical Therapy Treatment  Patient Details  Name: Allison [name] MRN: [id number] Date of Birth: 17-Jan-1951 Referring Provider (PT): Lavina Hamman, MD   Encounter Date: 03/01/2021   PT End of Session - 03/01/21 1236     Visit Number 2    Number of Visits 13    Date for PT Re-Evaluation 05/23/21    Authorization Type [employer]    PT Start Time 1232    PT Stop Time 1314    PT Time Calculation (min) 42 min    Equipment Utilized During Treatment Gait belt    Activity Tolerance Patient limited by fatigue;Patient tolerated treatment well   limited by SOB   Behavior During Therapy [facility] for tasks assessed/performed             Past Medical History:  Diagnosis Date   Anxiety    Arthritis    Asthma    exercise induced   Depression    PMH   GERD (gastroesophageal reflux disease)    Glaucoma    History of radiation therapy 01/05/2021   IMRT right lung  11/24/2020-01/05/2021  Dr [name]   Hypertension    lung ca dx'd 11/2017   right   Malignant pleural effusion    right   PONV (postoperative nausea and vomiting)    Pre-diabetes    Raynaud's disease    Raynaud's disease     Past Surgical History:  Procedure Laterality Date   ABDOMINAL HYSTERECTOMY     partial   CHEST TUBE INSERTION Right 01/01/2018   Procedure: INSERTION PLEURAL DRAINAGE CATHETER;  Surgeon: Ivin Poot, MD;  Location: [facility];  Service: Thoracic;  Laterality: Right;   COLONOSCOPY     CORONARY STENT INTERVENTION N/A 06/16/2019   Procedure: CORONARY STENT INTERVENTION;  Surgeon: Jettie Booze, MD;  Location: [facility];  Service: Cardiovascular;  Laterality: N/A;   DILATION AND CURETTAGE OF UTERUS     EYE SURGERY     due to Glaucoma   LEFT HEART CATH AND CORONARY ANGIOGRAPHY N/A 06/16/2019   Procedure: LEFT HEART CATH AND  CORONARY ANGIOGRAPHY;  Surgeon: Jettie Booze, MD;  Location: [facility];  Service: Cardiovascular;  Laterality: N/A;   REMOVAL OF PLEURAL DRAINAGE CATHETER Right 11/07/2018   Procedure: REMOVAL OF PLEURAL DRAINAGE CATHETER;  Surgeon: Ivin Poot, MD;  Location: [facility];  Service: Thoracic;  Laterality: Right;   ROTATOR CUFF REPAIR     TUBAL LIGATION     WISDOM TOOTH EXTRACTION      Vitals:   03/01/21 1240 03/01/21 1257 03/01/21 1311  BP: (!) 181/93 (!) 167/94 (!) 164/84  Pulse: 83 87 89     Subjective Assessment - 03/01/21 1235     Subjective Patient reports she is still using the morpine in the mornings, as this is the most challenging part of the day. Patient reports that she will be seeing eye doctor for cataract.    Patient is accompained by: --   partner, [name]   Pertinent History PMH: recent PE and DVT which she was hospitalized for and discharged 8/25 on Eliquis. HTN, lung CA , CAD, MI, glaucoma.    Limitations House hold activities;Walking;Standing    Diagnostic tests MRI reveal Patchy multifocal acute to subacute ischemic infarcts involving  the bilateral cerebral and cerebellar hemispheres.    Patient Stated  Goals wants to get her balance and speed back, breathe better. likes being active.    Currently in Pain? No/denies                [facility] PT Assessment - 03/01/21 0001       6 Minute Walk- Baseline   6 Minute Walk- Baseline yes    BP (mmHg) 181/93    HR (bpm) 88    02 Sat (%RA) 98 %    Modified Borg Scale for Dyspnea 0- Nothing at all    Perceived Rate of Exertion (Borg) 6-      6 Minute walk- Post Test   6 Minute Walk Post Test yes    BP (mmHg) 182/96    HR (bpm) 95    02 Sat (%RA) 98 %    Modified Borg Scale for Dyspnea 5- Strong or hard breathing    Perceived Rate of Exertion (Borg) 15- Hard      6 minute walk test results    Aerobic Endurance Distance Walked 531    Endurance additional comments without AD, mild veering/imbalance  noted. Extended rest break required due to elevated BP and SOB.      Functional Gait  Assessment   Gait assessed  Yes    Gait Level Surface Walks 20 ft in less than 7 sec but greater than 5.5 sec, uses assistive device, slower speed, mild gait deviations, or deviates 6-10 in outside of the 12 in walkway width.    Change in Gait Speed Able to smoothly change walking speed without loss of balance or gait deviation. Deviate no more than 6 in outside of the 12 in walkway width.    Gait with Horizontal Head Turns Performs head turns smoothly with slight change in gait velocity (eg, minor disruption to smooth gait path), deviates 6-10 in outside 12 in walkway width, or uses an assistive device.    Gait with Vertical Head Turns Performs task with slight change in gait velocity (eg, minor disruption to smooth gait path), deviates 6 - 10 in outside 12 in walkway width or uses assistive device    Gait and Pivot Turn Pivot turns safely in greater than 3 sec and stops with no loss of balance, or pivot turns safely within 3 sec and stops with mild imbalance, requires small steps to catch balance.    Step Over Obstacle Is able to step over one shoe box (4.5 in total height) but must slow down and adjust steps to clear box safely. May require verbal cueing.    Gait with Narrow Base of Support Ambulates 7-9 steps.    Gait with Eyes Closed Walks 20 ft, uses assistive device, slower speed, mild gait deviations, deviates 6-10 in outside 12 in walkway width. Ambulates 20 ft in less than 9 sec but greater than 7 sec.    Ambulating Backwards Walks 20 ft, uses assistive device, slower speed, mild gait deviations, deviates 6-10 in outside 12 in walkway width.    Steps Alternating feet, must use rail.    Total Score 20    FGA comment: 20/30                PT Education - 03/01/21 1255     Education Details 3MWT and FGA results; continue to monitor BP    Person(s) Educated Patient    Methods Explanation     Comprehension Verbalized understanding              PT Short Term Goals -  03/01/21 1255       PT SHORT TERM GOAL #1   Title Pt will be independent with initial HEP in order to build upon functional gains made in therapy. ALL STGS DUE 03/15/21    Time 3    Period Weeks    Status New    Target Date 03/15/21      PT SHORT TERM GOAL #2   Title Pt will undergo further assessment of FGA with LTG written as appropriate in order to demo decr fall risk.    Baseline not yet assessed. Assessed on 03/01/21 and LTG set    Time 3    Period Weeks    Status Achieved      PT SHORT TERM GOAL #3   Title Pt will undergo 3MWT with LTG written as appropriate in order to demo improved gait endurance.    Baseline assessed on 9/13 and LTG set    Time 3    Period Weeks    Status Achieved      PT SHORT TERM GOAL #4   Title Pt will verbalize understanding of fall prevention in the home.    Time 3    Period Weeks    Status New      PT SHORT TERM GOAL #5   Title Pt will improve gait speed with no AD vs. LRAD to at least 2.9 ft/sec in order to demo improved community mobility.    Baseline 2.63 ft/sec    Time 3    Period Weeks    Status New               PT Long Term Goals - 03/01/21 1255       PT LONG TERM GOAL #1   Title Pt will be independent with final HEP in order to build upon functional gains made in therapy. ALL LTGS DUE 04/05/21    Time 6    Period Weeks    Status New      PT LONG TERM GOAL #2   Title patient will improve FGA to >/= 24/30 to demo improved balance and reduced fall risk    Baseline not yet assessed. 20/30    Time 6    Period Weeks    Status Revised      PT LONG TERM GOAL #3   Title Patient will demo ability to ambualte >/= 700 ft in 3MWT to demo improved endurance    Baseline 531 ft    Time 6    Period Weeks    Status Revised      PT LONG TERM GOAL #4   Title Pt will ascend/descend 6 steps using single handrail with step through pattern with mod I in  order to safely enter/exit home.    Time 6    Period Weeks    Status New      PT LONG TERM GOAL #5   Title Pt will improve gait speed with no AD vs. LRAD to at least 3.3 ft/sec in order to demo improved community mobility and gait efficiency.    Baseline 2.63 ft/sec    Time 6    Period Weeks    Status New      PT LONG TERM GOAL #6   Title Pt will improve FOTO  to 76 in order to demo improved functional outcomes.    Baseline 62 at eval    Time 6    Period Weeks    Status New  Plan - 03/01/21 1330     Clinical Impression Statement Continued assesment of balance and endurance today. Patient able to ambulate 531 ft in 3MWT without AD, but demonstrate significant SOB and physical exertion demosntating limited endurance at this time. With FGA, patient scored 20/30 indicating medium risk for falls. Multiple extended rest breaks required to elevated BP and fatigue. PT educaitng on continue to monitor BP. Will cotninue to progress toward all LTGs.    Personal Factors and Comorbidities Comorbidity 3+;Past/Current Experience;Time since onset of injury/illness/exacerbation    Comorbidities recent PE and DVT which she was hospitalized for and discharged 8/25 on Eliquis. HTN, lung CA , CAD, MI, glaucoma.    Examination-Activity Limitations Stand;Stairs;Squat;Locomotion Level;Transfers    Examination-Participation Restrictions Investment banker, operational;Yard Work   Community education officer Evolving/Moderate complexity    Rehab Potential Good    PT Frequency 2x / week    PT Duration 12 weeks    PT Treatment/Interventions ADLs/Self Care Home Management;DME Instruction;Gait training;Stair training;Functional mobility training;Therapeutic activities;Therapeutic exercise;Balance training;Neuromuscular re-education;Patient/family education;Energy conservation;Vestibular    PT Next Visit Plan . initial HEP for functional BLE strength/balance (based  on deficits on FGA), practice SPC with pt for balance with gait.    Consulted and Agree with Plan of Care Patient   pt's partner            Patient will benefit from skilled therapeutic intervention in order to improve the following deficits and impairments:  Abnormal gait, Cardiopulmonary status limiting activity, Decreased activity tolerance, Decreased balance, Decreased knowledge of use of DME, Decreased endurance, Difficulty walking, Decreased strength, Impaired vision/preception  Visit Diagnosis: Unsteadiness on feet  Muscle weakness (generalized)  Other abnormalities of gait and mobility     Problem List Patient Active Problem List   Diagnosis Date Noted   Epiretinal membrane, right eye 02/28/2021   Nuclear sclerotic cataract of right eye 02/28/2021   Nuclear sclerotic cataract of left eye 02/28/2021   Coronary artery disease of native artery of native heart with stable angina pectoris (HCC)    CVA (cerebral vascular accident) ([place]) 02/12/2021   Current use of long term anticoagulation 02/12/2021   Hypokalemia 02/12/2021   Leg DVT (deep venous thromboembolism), acute, bilateral ([place]) 02/08/2021   Aortic atherosclerosis ([place]) 02/08/2021   Respiratory failure ([place]) 02/08/2021   Postobstructive pneumonia 02/07/2021   Pulmonary embolism ([place]) 02/07/2021   Elevated troponin 02/07/2021   Encounter to establish care 02/02/2021   Shortness of breath 02/02/2021   Cough 02/02/2021   Statin myopathy 12/03/2020   Acute non-recurrent frontal sinusitis 11/18/2020   NSCLC with EGFR mutation ([place]) 06/30/2020   Hx of chest tube placement 02/11/2020   History of chest tube placement 11/12/2019   Abnormal liver function 08/20/2019   Gastroesophageal reflux disease without esophagitis 08/20/2019   Generalized anxiety disorder 08/20/2019   Glaucoma 08/20/2019   Hyperlipidemia 08/20/2019   Menopausal syndrome 08/20/2019   Raynaud's phenomenon 08/20/2019   Vitamin D deficiency  08/20/2019   Status post coronary artery stent placement    History of non-ST elevation myocardial infarction (NSTEMI) 06/12/2019   Hypertension 03/24/2019   Encounter for antineoplastic chemotherapy 01/22/2018   Adenocarcinoma of right lung, stage 4 ([place]) 01/04/2018   Goals of care, counseling/discussion 01/04/2018   Thyroid nodule 12/11/2017   Pleural effusion, right 12/11/2017   Insomnia 04/20/2014    Jones Bales, PT, DPT 03/01/2021, 1:34 PM  [facility] [address] [place], [place], [address] Phone: [phone]  Fax:  [phone]  Name: DALIAH CHAUDOIN MRN: [id number] Date of Birth: 19-Nov-1950

## 2021-03-02 ENCOUNTER — Telehealth: Payer: Self-pay | Admitting: *Deleted

## 2021-03-02 NOTE — Telephone Encounter (Signed)
Patient had returned call and I spoke to her relayed Dr. Cathren Laine message in regards to her MRI that she had done and the results that she did had strokes but not cancer in the brain.  She is to keep follow-up next month on the 04/12/21 with Frann Rider, NP and will discuss repeating MRI at that time patient verbalized understanding she will call back as needed.

## 2021-03-02 NOTE — Telephone Encounter (Signed)
-----   Message from Melvenia Beam, MD sent at 02/28/2021  5:58 PM EDT ----- See phone note to call patient

## 2021-03-02 NOTE — Telephone Encounter (Signed)
Called pt to touch base and see that she got the result message from Dr Jaynee Eagles re: MRI brain. Left office number in message for call back.    Hi Ms. Gruetzmacher, it looks like you did have strokes and not cancer in the brain. You have an appointment in our office next month I think we can wait and repeat the MRi again then. But I would follow up with your oncologist of course as well. Thanks, Dr. Jaynee Eagles  Written by Melvenia Beam, MD on 02/28/2021  5:58 PM EDT Seen by patient Allison [name] on 02/28/2021  9:53 PM

## 2021-03-03 ENCOUNTER — Other Ambulatory Visit: Payer: Self-pay

## 2021-03-03 ENCOUNTER — Ambulatory Visit: Payer: Medicare HMO

## 2021-03-03 VITALS — BP 141/84 | HR 92

## 2021-03-03 DIAGNOSIS — R2689 Other abnormalities of gait and mobility: Secondary | ICD-10-CM | POA: Diagnosis not present

## 2021-03-03 DIAGNOSIS — R2681 Unsteadiness on feet: Secondary | ICD-10-CM

## 2021-03-03 DIAGNOSIS — M6281 Muscle weakness (generalized): Secondary | ICD-10-CM

## 2021-03-03 DIAGNOSIS — R29818 Other symptoms and signs involving the nervous system: Secondary | ICD-10-CM | POA: Diagnosis not present

## 2021-03-03 NOTE — Patient Instructions (Signed)
Access Code: [email or web address] URL: [email or web address] Date: 03/03/2021 Prepared by: Baldomero Lamy  Exercises Sit to Stand with Arms Crossed - 1 x daily - 5 x weekly - 1 sets - 10 reps Supine Bridge - 1 x daily - 5 x weekly - 1 sets - 10 reps Sidelying Hip Abduction - 1 x daily - 5 x weekly - 1 sets - 10 reps

## 2021-03-03 NOTE — Therapy (Signed)
[facility] [address] [place], [place], [address] Phone: [phone]   Fax:  [phone]  Physical Therapy Treatment  Patient Details  Name: Allison [name] MRN: [id number] Date of Birth: 11-28-1950 Referring Provider (PT): Lavina Hamman, MD   Encounter Date: 03/03/2021   PT End of Session - 03/03/21 1108     Visit Number 3    Number of Visits 13    Date for PT Re-Evaluation 05/23/21    Authorization Type [employer] Medicare    PT Start Time 1104   patient arriving late   PT Stop Time 1144    PT Time Calculation (min) 40 min    Equipment Utilized During Treatment Gait belt    Activity Tolerance Patient limited by fatigue;Patient tolerated treatment well   limited by SOB   Behavior During Therapy [facility] for tasks assessed/performed             Past Medical History:  Diagnosis Date   Anxiety    Arthritis    Asthma    exercise induced   Depression    PMH   GERD (gastroesophageal reflux disease)    Glaucoma    History of radiation therapy 01/05/2021   IMRT right lung  11/24/2020-01/05/2021  Dr [name]   Hypertension    lung ca dx'd 11/2017   right   Malignant pleural effusion    right   PONV (postoperative nausea and vomiting)    Pre-diabetes    Raynaud's disease    Raynaud's disease     Past Surgical History:  Procedure Laterality Date   ABDOMINAL HYSTERECTOMY     partial   CHEST TUBE INSERTION Right 01/01/2018   Procedure: INSERTION PLEURAL DRAINAGE CATHETER;  Surgeon: Ivin Poot, MD;  Location: [facility];  Service: Thoracic;  Laterality: Right;   COLONOSCOPY     CORONARY STENT INTERVENTION N/A 06/16/2019   Procedure: CORONARY STENT INTERVENTION;  Surgeon: Jettie Booze, MD;  Location: [facility];  Service: Cardiovascular;  Laterality: N/A;   DILATION AND CURETTAGE OF UTERUS     EYE SURGERY     due to Glaucoma   LEFT HEART CATH AND CORONARY ANGIOGRAPHY N/A 06/16/2019    Procedure: LEFT HEART CATH AND CORONARY ANGIOGRAPHY;  Surgeon: Jettie Booze, MD;  Location: [facility];  Service: Cardiovascular;  Laterality: N/A;   REMOVAL OF PLEURAL DRAINAGE CATHETER Right 11/07/2018   Procedure: REMOVAL OF PLEURAL DRAINAGE CATHETER;  Surgeon: Ivin Poot, MD;  Location: [facility];  Service: Thoracic;  Laterality: Right;   ROTATOR CUFF REPAIR     TUBAL LIGATION     WISDOM TOOTH EXTRACTION      Vitals:   03/03/21 1109 03/03/21 1125  BP: (!) 166/81 (!) 141/84  Pulse: 88 92     Subjective Assessment - 03/03/21 1106     Subjective Patient reports she did not sleep well last night. Does report some pain in the mid back, that started yesterday.    Patient is accompained by: --   partner, [name]   Pertinent History PMH: recent PE and DVT which she was hospitalized for and discharged 8/25 on Eliquis. HTN, lung CA , CAD, MI, glaucoma.    Limitations House hold activities;Walking;Standing    Diagnostic tests MRI reveal Patchy multifocal acute to subacute ischemic infarcts involving  the bilateral cerebral and cerebellar hemispheres.    Patient Stated Goals wants to get her balance and speed back, breathe better. likes being  active.    Currently in Pain? Yes    Pain Score 5     Pain Location Back    Pain Orientation Mid;Medial    Pain Descriptors / Indicators Other (Comment)   reports "its just there"   Pain Type Acute pain    Pain Onset Yesterday    Pain Frequency Intermittent                [facility] Adult PT Treatment/Exercise - 03/03/21 0001       Transfers   Transfers Sit to Stand;Stand to Sit    Sit to Stand 5: Supervision;Without upper extremity assist    Stand to Sit 5: Supervision;Without upper extremity assist    Number of Reps 10 reps;1 set    Comments completed from mat with no UE support x 10 reps, cues for control with descent.      Ambulation/Gait   Ambulation/Gait Yes    Ambulation/Gait Assistance 5: Supervision     Ambulation/Gait Assistance Details Completed ambulation with SPC x 115 ft to learn appropriate sequencing iwth patient able to demo correctly. Then progressed to ambulation outdoors on unlevel surfaces including pavement and grass x 500 ft. Increased challenge on grass > pavement, iwth increased reliance on SPC. Supervision to CGA.    Ambulation Distance (Feet) 115 Feet   500 x 1   Assistive device Straight cane    Gait Pattern Step-through pattern;Decreased stride length;Decreased arm swing - left;Decreased arm swing - right    Ambulation Surface Level;Indoor;Unlevel;Outdoor;Paved    Gait Comments Educated on [facility] purchase options.            Initiated HEP and completed all of the following exercises:   Access Code: [email or web address] URL: [email or web address] Date: 03/03/2021 Prepared by: Baldomero Lamy  Exercises Sit to Stand with Arms Crossed - 1 x daily - 5 x weekly - 1 sets - 10 reps Supine Bridge - 1 x daily - 5 x weekly - 1 sets - 10 reps Sidelying Hip Abduction - 1 x daily - 5 x weekly - 1 sets - 10 reps         PT Education - 03/03/21 1140     Education Details Initial HEP; SPC purchase options    Person(s) Educated Patient    Methods Explanation;Demonstration;Verbal cues;Handout    Comprehension Verbalized understanding;Returned demonstration              PT Short Term Goals - 03/01/21 1255       PT SHORT TERM GOAL #1   Title Pt will be independent with initial HEP in order to build upon functional gains made in therapy. ALL STGS DUE 03/15/21    Time 3    Period Weeks    Status New    Target Date 03/15/21      PT SHORT TERM GOAL #2   Title Pt will undergo further assessment of FGA with LTG written as appropriate in order to demo decr fall risk.    Baseline not yet assessed. Assessed on 03/01/21 and LTG set    Time 3    Period Weeks    Status Achieved      PT SHORT TERM GOAL #3   Title Pt will undergo 3MWT with LTG written as appropriate in  order to demo improved gait endurance.    Baseline assessed on 9/13 and LTG set    Time 3    Period Weeks    Status Achieved      PT SHORT TERM GOAL #  4   Title Pt will verbalize understanding of fall prevention in the home.    Time 3    Period Weeks    Status New      PT SHORT TERM GOAL #5   Title Pt will improve gait speed with no AD vs. LRAD to at least 2.9 ft/sec in order to demo improved community mobility.    Baseline 2.63 ft/sec    Time 3    Period Weeks    Status New               PT Long Term Goals - 03/01/21 1255       PT LONG TERM GOAL #1   Title Pt will be independent with final HEP in order to build upon functional gains made in therapy. ALL LTGS DUE 04/05/21    Time 6    Period Weeks    Status New      PT LONG TERM GOAL #2   Title patient will improve FGA to >/= 24/30 to demo improved balance and reduced fall risk    Baseline not yet assessed. 20/30    Time 6    Period Weeks    Status Revised      PT LONG TERM GOAL #3   Title Patient will demo ability to ambualte >/= 700 ft in 3MWT to demo improved endurance    Baseline 531 ft    Time 6    Period Weeks    Status Revised      PT LONG TERM GOAL #4   Title Pt will ascend/descend 6 steps using single handrail with step through pattern with mod I in order to safely enter/exit home.    Time 6    Period Weeks    Status New      PT LONG TERM GOAL #5   Title Pt will improve gait speed with no AD vs. LRAD to at least 3.3 ft/sec in order to demo improved community mobility and gait efficiency.    Baseline 2.63 ft/sec    Time 6    Period Weeks    Status New      PT LONG TERM GOAL #6   Title Pt will improve FOTO  to 76 in order to demo improved functional outcomes.    Baseline 62 at eval    Time 6    Period Weeks    Status New                   Plan - 03/03/21 1133     Clinical Impression Statement Completed gait training with SPC indoors and outdoors, more use of SPC and improved  balance with this AD on outdoors surfaces. Rest of session spent establishing initial HEP for BLE strengthening. Patient tolerating well with intermittent rest breaks. Vitals stable throughout session.    Personal Factors and Comorbidities Comorbidity 3+;Past/Current Experience;Time since onset of injury/illness/exacerbation    Comorbidities recent PE and DVT which she was hospitalized for and discharged 8/25 on Eliquis. HTN, lung CA , CAD, MI, glaucoma.    Examination-Activity Limitations Stand;Stairs;Squat;Locomotion Level;Transfers    Examination-Participation Restrictions Investment banker, operational;Yard Work   Community education officer Evolving/Moderate complexity    Rehab Potential Good    PT Frequency 2x / week    PT Duration 12 weeks    PT Treatment/Interventions ADLs/Self Care Home Management;DME Instruction;Gait training;Stair training;Functional mobility training;Therapeutic activities;Therapeutic exercise;Balance training;Neuromuscular re-education;Patient/family education;Energy conservation;Vestibular    PT Next Visit Plan continue [place] outdoors.  Add balance to HEP. Continued high level balance.    Consulted and Agree with Plan of Care Patient   pt's partner            Patient will benefit from skilled therapeutic intervention in order to improve the following deficits and impairments:  Abnormal gait, Cardiopulmonary status limiting activity, Decreased activity tolerance, Decreased balance, Decreased knowledge of use of DME, Decreased endurance, Difficulty walking, Decreased strength, Impaired vision/preception  Visit Diagnosis: Unsteadiness on feet  Muscle weakness (generalized)  Other abnormalities of gait and mobility     Problem List Patient Active Problem List   Diagnosis Date Noted   Epiretinal membrane, right eye 02/28/2021   Nuclear sclerotic cataract of right eye 02/28/2021   Nuclear sclerotic cataract of left eye 02/28/2021    Coronary artery disease of native artery of native heart with stable angina pectoris ([place])    CVA (cerebral vascular accident) ([place]) 02/12/2021   Current use of long term anticoagulation 02/12/2021   Hypokalemia 02/12/2021   Leg DVT (deep venous thromboembolism), acute, bilateral ([place]) 02/08/2021   Aortic atherosclerosis ([place]) 02/08/2021   Respiratory failure ([place]) 02/08/2021   Postobstructive pneumonia 02/07/2021   Pulmonary embolism ([place]) 02/07/2021   Elevated troponin 02/07/2021   Encounter to establish care 02/02/2021   Shortness of breath 02/02/2021   Cough 02/02/2021   Statin myopathy 12/03/2020   Acute non-recurrent frontal sinusitis 11/18/2020   NSCLC with EGFR mutation ([place]) 06/30/2020   Hx of chest tube placement 02/11/2020   History of chest tube placement 11/12/2019   Abnormal liver function 08/20/2019   Gastroesophageal reflux disease without esophagitis 08/20/2019   Generalized anxiety disorder 08/20/2019   Glaucoma 08/20/2019   Hyperlipidemia 08/20/2019   Menopausal syndrome 08/20/2019   Raynaud's phenomenon 08/20/2019   Vitamin D deficiency 08/20/2019   Status post coronary artery stent placement    History of non-ST elevation myocardial infarction (NSTEMI) 06/12/2019   Hypertension 03/24/2019   Encounter for antineoplastic chemotherapy 01/22/2018   Adenocarcinoma of right lung, stage 4 ([place]) 01/04/2018   Goals of care, counseling/discussion 01/04/2018   Thyroid nodule 12/11/2017   Pleural effusion, right 12/11/2017   Insomnia 04/20/2014    Jones Bales, PT, DPT 03/03/2021, 11:46 AM  [facility] [address] [place], [place], [address] Phone: [phone]   Fax:  [phone]  Name: Allison [name] MRN: [id number] Date of Birth: 1950/12/25

## 2021-03-04 ENCOUNTER — Other Ambulatory Visit: Payer: Self-pay

## 2021-03-04 ENCOUNTER — Ambulatory Visit (HOSPITAL_COMMUNITY)
Admission: RE | Admit: 2021-03-04 | Discharge: 2021-03-04 | Disposition: A | Discharge status: Home / Self Care | Payer: Medicare HMO | Source: Ambulatory Visit | Attending: Internal Medicine | Admitting: Internal Medicine

## 2021-03-04 ENCOUNTER — Ambulatory Visit: Payer: Medicare HMO | Admitting: Thoracic Surgery (Cardiothoracic Vascular Surgery)

## 2021-03-04 VITALS — BP 158/81 | HR 96 | Resp 20 | Ht 64.0 in | Wt 125.0 lb

## 2021-03-04 DIAGNOSIS — C3491 Malignant neoplasm of unspecified part of right bronchus or lung: Secondary | ICD-10-CM | POA: Diagnosis not present

## 2021-03-04 DIAGNOSIS — Z79899 Other long term (current) drug therapy: Secondary | ICD-10-CM | POA: Diagnosis not present

## 2021-03-04 DIAGNOSIS — Z9889 Other specified postprocedural states: Secondary | ICD-10-CM

## 2021-03-04 DIAGNOSIS — J910 Malignant pleural effusion: Secondary | ICD-10-CM | POA: Diagnosis not present

## 2021-03-04 DIAGNOSIS — I700 Atherosclerosis of aorta: Secondary | ICD-10-CM | POA: Diagnosis not present

## 2021-03-04 DIAGNOSIS — J90 Pleural effusion, not elsewhere classified: Secondary | ICD-10-CM | POA: Diagnosis not present

## 2021-03-04 DIAGNOSIS — C3490 Malignant neoplasm of unspecified part of unspecified bronchus or lung: Secondary | ICD-10-CM | POA: Diagnosis not present

## 2021-03-04 DIAGNOSIS — I10 Essential (primary) hypertension: Secondary | ICD-10-CM | POA: Diagnosis not present

## 2021-03-04 MED ORDER — IOHEXOL 350 MG/ML SOLN
75.0000 mL | Freq: Once | INTRAVENOUS | Status: AC | PRN
  Administered 2021-03-04: 60 mL via INTRAVENOUS

## 2021-03-05 LAB — BASIC METABOLIC PANEL
BUN/Creatinine Ratio: 21 (ref 12–28)
BUN: 21 mg/dL (ref 8–27)
CO2: 25 mmol/L (ref 20–29)
Calcium: 9.9 mg/dL (ref 8.7–10.3)
Chloride: 100 mmol/L (ref 96–106)
Creatinine, Ser: 1.02 mg/dL — ABNORMAL HIGH (ref 0.57–1.00)
Glucose: 129 mg/dL — ABNORMAL HIGH (ref 65–99)
Potassium: 4.6 mmol/L (ref 3.5–5.2)
Sodium: 140 mmol/L (ref 134–144)
eGFR: 59 mL/min/{1.73_m2} — ABNORMAL LOW (ref >59)

## 2021-03-06 NOTE — Telephone Encounter (Signed)
Would continue to check BP daily with starting HCTZ and can we schedule in pharmacy HTN clinic in next few weeks?

## 2021-03-07 ENCOUNTER — Ambulatory Visit: Payer: Medicare HMO | Admitting: Nurse Practitioner

## 2021-03-07 DIAGNOSIS — C3491 Malignant neoplasm of unspecified part of right bronchus or lung: Secondary | ICD-10-CM | POA: Diagnosis not present

## 2021-03-07 DIAGNOSIS — J069 Acute upper respiratory infection, unspecified: Secondary | ICD-10-CM | POA: Diagnosis not present

## 2021-03-07 NOTE — Progress Notes (Signed)
     [address]       [place] [address]             [phone]        [name] presents today in follow-up.  She is a former patient of Dr. Darcey Nora.  He placed a Pleurx catheter for drainage of an effusion in the setting of stage IV lung cancer.  Over the last several weeks she has developed worsening shortness of breath and chest fullness.  She also developed a stroke and has been admitted on 2 occasions within the last month.  Today she remains short of breath.  She is dyspneic in conversation.  She is also moving very slowly with ambulation.  She is currently being followed by Dr.Mohamed she is scheduled to undergo another CT scan today.  From a surgical standpoint there is not much that I am able to offer her.  She was seeing Dr. Darcey Nora as a courtesy, but she has never truly been a surgical patient.  She was understanding of this and given her change in clinical course continuing to see several physicians has become exhausted.  She is comfortable just following up with her oncologist.  [name]

## 2021-03-08 ENCOUNTER — Ambulatory Visit: Payer: Medicare HMO

## 2021-03-08 ENCOUNTER — Other Ambulatory Visit: Payer: Self-pay

## 2021-03-08 VITALS — BP 155/81 | HR 86

## 2021-03-08 DIAGNOSIS — R2681 Unsteadiness on feet: Secondary | ICD-10-CM

## 2021-03-08 DIAGNOSIS — R2689 Other abnormalities of gait and mobility: Secondary | ICD-10-CM

## 2021-03-08 DIAGNOSIS — M6281 Muscle weakness (generalized): Secondary | ICD-10-CM | POA: Diagnosis not present

## 2021-03-08 DIAGNOSIS — R29818 Other symptoms and signs involving the nervous system: Secondary | ICD-10-CM | POA: Diagnosis not present

## 2021-03-08 NOTE — Therapy (Signed)
[facility] [address] [place], [place], [address] Phone: [phone]   Fax:  [phone]  Physical Therapy Treatment  Patient Details  Name: Allison [name] MRN: [id number] Date of Birth: [birth year] Referring Provider (PT): Lavina Hamman, MD   Encounter Date: 03/08/2021   PT End of Session - 03/08/21 1157     Visit Number 4    Number of Visits 13    Date for PT Re-Evaluation 05/23/21    Authorization Type Aetna Medicare    PT Start Time 1102    PT Stop Time 1143    PT Time Calculation (min) 41 min    Equipment Utilized During Treatment Gait belt    Activity Tolerance Patient limited by fatigue;Patient tolerated treatment well   limited by SOB   Behavior During Therapy [facility] for tasks assessed/performed             Past Medical History:  Diagnosis Date   Anxiety    Arthritis    Asthma    exercise induced   Depression    PMH   GERD (gastroesophageal reflux disease)    Glaucoma    History of radiation therapy 01/05/2021   IMRT right lung  11/24/2020-01/05/2021  Dr [name]   Hypertension    lung ca dx'd 11/2017   right   Malignant pleural effusion    right   PONV (postoperative nausea and vomiting)    Pre-diabetes    Raynaud's disease    Raynaud's disease     Past Surgical History:  Procedure Laterality Date   ABDOMINAL HYSTERECTOMY     partial   CHEST TUBE INSERTION Right 01/01/2018   Procedure: INSERTION PLEURAL DRAINAGE CATHETER;  Surgeon: Ivin Poot, MD;  Location: [facility];  Service: Thoracic;  Laterality: Right;   COLONOSCOPY     CORONARY STENT INTERVENTION N/A 06/16/2019   Procedure: CORONARY STENT INTERVENTION;  Surgeon: Jettie Booze, MD;  Location: [facility];  Service: Cardiovascular;  Laterality: N/A;   DILATION AND CURETTAGE OF UTERUS     EYE SURGERY     due to Glaucoma   LEFT HEART CATH AND CORONARY ANGIOGRAPHY N/A 06/16/2019   Procedure: LEFT HEART CATH AND  CORONARY ANGIOGRAPHY;  Surgeon: Jettie Booze, MD;  Location: [place];  Service: Cardiovascular;  Laterality: N/A;   REMOVAL OF PLEURAL DRAINAGE CATHETER Right 11/07/2018   Procedure: REMOVAL OF PLEURAL DRAINAGE CATHETER;  Surgeon: Ivin Poot, MD;  Location: [facility];  Service: Thoracic;  Laterality: Right;   ROTATOR CUFF REPAIR     TUBAL LIGATION     WISDOM TOOTH EXTRACTION      Vitals:   03/08/21 1111 03/08/21 1114 03/08/21 1118  BP: (!) 179/96 (!) 163/96 (!) 155/81  Pulse: 86 83 86     Subjective Assessment - 03/08/21 1105     Subjective Patient reports had a virtual visit due to some congestion in the chest. Reports that she has got a SPC walking in with it today. No falls. Has follow up visit with Cardiologist early october regarding elevated Blood Pressure.    Patient is accompained by: --   partner, [name]   Pertinent History PMH: recent PE and DVT which she was hospitalized for and discharged 8/25 on Eliquis. HTN, lung CA , CAD, MI, glaucoma.    Limitations House hold activities;Walking;Standing    Diagnostic tests MRI reveal Patchy multifocal acute to subacute ischemic infarcts involving  the bilateral cerebral and  cerebellar hemispheres.    Patient Stated Goals wants to get her balance and speed back, breathe better. likes being active.    Currently in Pain? Yes    Pain Score 4     Pain Location Back    Pain Orientation Mid;Medial    Pain Descriptors / Indicators Other (Comment);Dull    Pain Type Chronic pain    Pain Onset More than a month ago              [facility] Adult PT Treatment/Exercise - 03/08/21 0001       Transfers   Transfers Sit to Stand;Stand to Sit    Sit to Stand 5: Supervision;Without upper extremity assist;4: Min guard    Stand to Sit 5: Supervision;Without upper extremity assist;4: Min guard    Number of Reps 2 sets   x 5 reps, intermittent CGA   Comments completed from standard chair with no UE support. BP after completion:  188/102, HR: 91. Require extended rest break.      Ambulation/Gait   Ambulation/Gait Yes    Ambulation/Gait Assistance 5: Supervision;4: Min guard    Ambulation/Gait Assistance Details Completed ambulation x 150 ft with [place] working on improved endurance on indoor level surfaces. one standing rest break required due to fatigue. More fatigue noted compared to prior session requiring more frequent rest breaks.    Ambulation Distance (Feet) 150 Feet    Assistive device Straight cane    Gait Pattern Step-through pattern;Decreased stride length;Decreased arm swing - left;Decreased arm swing - right    Ambulation Surface Level;Indoor    Gait Comments PT adjusted patient personal SPC to patient's height during session. BP after ambulation: 160/82, HR: 82              PT Short Term Goals - 03/01/21 1255       PT SHORT TERM GOAL #1   Title Pt will be independent with initial HEP in order to build upon functional gains made in therapy. ALL STGS DUE 03/15/21    Time 3    Period Weeks    Status New    Target Date 03/15/21      PT SHORT TERM GOAL #2   Title Pt will undergo further assessment of FGA with LTG written as appropriate in order to demo decr fall risk.    Baseline not yet assessed. Assessed on 03/01/21 and LTG set    Time 3    Period Weeks    Status Achieved      PT SHORT TERM GOAL #3   Title Pt will undergo 3MWT with LTG written as appropriate in order to demo improved gait endurance.    Baseline assessed on 9/13 and LTG set    Time 3    Period Weeks    Status Achieved      PT SHORT TERM GOAL #4   Title Pt will verbalize understanding of fall prevention in the home.    Time 3    Period Weeks    Status New      PT SHORT TERM GOAL #5   Title Pt will improve gait speed with no AD vs. LRAD to at least 2.9 ft/sec in order to demo improved community mobility.    Baseline 2.63 ft/sec    Time 3    Period Weeks    Status New               PT Long Term Goals - 03/01/21  1255  PT LONG TERM GOAL #1   Title Pt will be independent with final HEP in order to build upon functional gains made in therapy. ALL LTGS DUE 04/05/21    Time 6    Period Weeks    Status New      PT LONG TERM GOAL #2   Title patient will improve FGA to >/= 24/30 to demo improved balance and reduced fall risk    Baseline not yet assessed. 20/30    Time 6    Period Weeks    Status Revised      PT LONG TERM GOAL #3   Title Patient will demo ability to ambualte >/= 700 ft in 3MWT to demo improved endurance    Baseline 531 ft    Time 6    Period Weeks    Status Revised      PT LONG TERM GOAL #4   Title Pt will ascend/descend 6 steps using single handrail with step through pattern with mod I in order to safely enter/exit home.    Time 6    Period Weeks    Status New      PT LONG TERM GOAL #5   Title Pt will improve gait speed with no AD vs. LRAD to at least 3.3 ft/sec in order to demo improved community mobility and gait efficiency.    Baseline 2.63 ft/sec    Time 6    Period Weeks    Status New      PT LONG TERM GOAL #6   Title Pt will improve FOTO  to 76 in order to demo improved functional outcomes.    Baseline 62 at eval    Time 6    Period Weeks    Status New                   Plan - 03/08/21 1135     Clinical Impression Statement Session limited today at started due to elevated BP require extended rest break for apporiate readings to allow for participation therapy services. Continued gait training and sit <> stand training with frequent rest breaks required, due to SOB and fatigue. PT engaging patient in deep breathing techniques to reduce SOB. Will continue to progress toward all LTGs.    Personal Factors and Comorbidities Comorbidity 3+;Past/Current Experience;Time since onset of injury/illness/exacerbation    Comorbidities recent PE and DVT which she was hospitalized for and discharged 8/25 on Eliquis. HTN, lung CA , CAD, MI, glaucoma.     Examination-Activity Limitations Stand;Stairs;Squat;Locomotion Level;Transfers    Examination-Participation Restrictions Investment banker, operational;Yard Work   Community education officer Evolving/Moderate complexity    Rehab Potential Good    PT Frequency 2x / week    PT Duration 12 weeks    PT Treatment/Interventions ADLs/Self Care Home Management;DME Instruction;Gait training;Stair training;Functional mobility training;Therapeutic activities;Therapeutic exercise;Balance training;Neuromuscular re-education;Patient/family education;Energy conservation;Vestibular    PT Next Visit Plan CHECK BP. Continued strengthening, ednurance and gait training with SPC. Add balance to HEP. Continued high level balance.    Consulted and Agree with Plan of Care Patient   pt's partner            Patient will benefit from skilled therapeutic intervention in order to improve the following deficits and impairments:  Abnormal gait, Cardiopulmonary status limiting activity, Decreased activity tolerance, Decreased balance, Decreased knowledge of use of DME, Decreased endurance, Difficulty walking, Decreased strength, Impaired vision/preception  Visit Diagnosis: Unsteadiness on feet  Muscle weakness (generalized)  Other abnormalities of  gait and mobility     Problem List Patient Active Problem List   Diagnosis Date Noted   Epiretinal membrane, right eye 02/28/2021   Nuclear sclerotic cataract of right eye 02/28/2021   Nuclear sclerotic cataract of left eye 02/28/2021   Coronary artery disease of native artery of native heart with stable angina pectoris ([place])    CVA (cerebral vascular accident) ([place]) 02/12/2021   Current use of long term anticoagulation 02/12/2021   Hypokalemia 02/12/2021   Leg DVT (deep venous thromboembolism), acute, bilateral ([place]) 02/08/2021   Aortic atherosclerosis ([place]) 02/08/2021   Respiratory failure ([place]) 02/08/2021   Postobstructive pneumonia  02/07/2021   Pulmonary embolism ([place]) 02/07/2021   Elevated troponin 02/07/2021   Encounter to establish care 02/02/2021   Shortness of breath 02/02/2021   Cough 02/02/2021   Statin myopathy 12/03/2020   Acute non-recurrent frontal sinusitis 11/18/2020   NSCLC with EGFR mutation ([place]) 06/30/2020   Hx of chest tube placement 02/11/2020   History of chest tube placement 11/12/2019   Abnormal liver function 08/20/2019   Gastroesophageal reflux disease without esophagitis 08/20/2019   Generalized anxiety disorder 08/20/2019   Glaucoma 08/20/2019   Hyperlipidemia 08/20/2019   Menopausal syndrome 08/20/2019   Raynaud's phenomenon 08/20/2019   Vitamin D deficiency 08/20/2019   Status post coronary artery stent placement    History of non-ST elevation myocardial infarction (NSTEMI) 06/12/2019   Hypertension 03/24/2019   Encounter for antineoplastic chemotherapy 01/22/2018   Adenocarcinoma of right lung, stage 4 ([place]) 01/04/2018   Goals of care, counseling/discussion 01/04/2018   Thyroid nodule 12/11/2017   Pleural effusion, right 12/11/2017   Insomnia 04/20/2014    Jones Bales, PT, DPT 03/08/2021, 11:58 AM  [facility] [address] [place] [address] [place], [place], [address] Phone: [phone]   Fax:  [phone]  Name: Allison [name] MRN: [id number] Date of Birth: [birth year]

## 2021-03-10 ENCOUNTER — Telehealth: Payer: Self-pay

## 2021-03-10 ENCOUNTER — Inpatient Hospital Stay: Payer: Medicare HMO | Admitting: Internal Medicine

## 2021-03-10 ENCOUNTER — Other Ambulatory Visit: Payer: Self-pay

## 2021-03-10 ENCOUNTER — Inpatient Hospital Stay: Payer: Medicare HMO

## 2021-03-10 ENCOUNTER — Ambulatory Visit: Payer: Medicare HMO | Admitting: Physical Therapy

## 2021-03-10 VITALS — BP 144/80 | HR 94 | Temp 97.0°F | Resp 18 | Wt 125.1 lb

## 2021-03-10 DIAGNOSIS — I10 Essential (primary) hypertension: Secondary | ICD-10-CM

## 2021-03-10 DIAGNOSIS — Z5111 Encounter for antineoplastic chemotherapy: Secondary | ICD-10-CM | POA: Diagnosis not present

## 2021-03-10 DIAGNOSIS — C3411 Malignant neoplasm of upper lobe, right bronchus or lung: Secondary | ICD-10-CM | POA: Diagnosis not present

## 2021-03-10 DIAGNOSIS — C3491 Malignant neoplasm of unspecified part of right bronchus or lung: Secondary | ICD-10-CM

## 2021-03-10 DIAGNOSIS — G893 Neoplasm related pain (acute) (chronic): Secondary | ICD-10-CM | POA: Diagnosis not present

## 2021-03-10 DIAGNOSIS — R0602 Shortness of breath: Secondary | ICD-10-CM | POA: Diagnosis not present

## 2021-03-10 DIAGNOSIS — R0609 Other forms of dyspnea: Secondary | ICD-10-CM | POA: Diagnosis not present

## 2021-03-10 DIAGNOSIS — Z79899 Other long term (current) drug therapy: Secondary | ICD-10-CM | POA: Diagnosis not present

## 2021-03-10 DIAGNOSIS — R059 Cough, unspecified: Secondary | ICD-10-CM | POA: Diagnosis not present

## 2021-03-10 MED ORDER — PROCHLORPERAZINE MALEATE 10 MG PO TABS: 10.0000 mg | ORAL_TABLET | Freq: Four times a day (QID) | ORAL | 0 refills | Status: DC | PRN

## 2021-03-10 MED ORDER — CYANOCOBALAMIN 1000 MCG/ML IJ SOLN
1000.0000 ug | Freq: Once | INTRAMUSCULAR | Status: AC
  Administered 2021-03-10: 1000 ug via INTRAMUSCULAR

## 2021-03-10 MED ORDER — CLONIDINE HCL 0.1 MG PO TABS
ORAL_TABLET | ORAL | Status: AC
  Filled 2021-03-10: qty 2

## 2021-03-10 MED ORDER — CLONIDINE HCL 0.1 MG PO TABS
0.2000 mg | ORAL_TABLET | Freq: Once | ORAL | Status: AC
  Administered 2021-03-10: 0.2 mg via ORAL

## 2021-03-10 MED ORDER — CYANOCOBALAMIN 1000 MCG/ML IJ SOLN
INTRAMUSCULAR | Status: AC
  Filled 2021-03-10: qty 1

## 2021-03-10 MED ORDER — LIDOCAINE-PRILOCAINE 2.5-2.5 % EX CREA: 1.0000 "application " | TOPICAL_CREAM | CUTANEOUS | 0 refills | Status: DC | PRN

## 2021-03-10 MED ORDER — FOLIC ACID 1 MG PO TABS: 1.0000 mg | ORAL_TABLET | Freq: Every day | ORAL | 4 refills | Status: DC

## 2021-03-10 NOTE — Patient Instructions (Addendum)
Pemetrexed injection What is this medication? PEMETREXED (PEM e TREX ed) is a chemotherapy drug used to treat lung cancers like non-small cell lung cancer and mesothelioma. It may also be used to treat other cancers. This medicine may be used for other purposes; ask your health care provider or pharmacist if you have questions. COMMON BRAND NAME(S): Alimta, PEMFEXY What should I tell my care team before I take this medication? They need to know if you have any of these conditions: infection (especially a virus infection such as chickenpox, cold sores, or herpes) kidney disease low blood counts, like low white cell, platelet, or red cell counts lung or breathing disease, like asthma radiation therapy an unusual or allergic reaction to pemetrexed, other medicines, foods, dyes, or preservative pregnant or trying to get pregnant breast-feeding How should I use this medication? This drug is given as an infusion into a vein. It is administered in a hospital or clinic by a specially trained health care professional. Talk to your pediatrician regarding the use of this medicine in children. Special care may be needed. Overdosage: If you think you have taken too much of this medicine contact a poison control center or emergency room at once. NOTE: This medicine is only for you. Do not share this medicine with others. What if I miss a dose? It is important not to miss your dose. Call your doctor or health care professional if you are unable to keep an appointment. What may interact with this medication? This medicine may interact with the following medications: Ibuprofen This list may not describe all possible interactions. Give your health care provider a list of all the medicines, herbs, non-prescription drugs, or dietary supplements you use. Also tell them if you smoke, drink alcohol, or use illegal drugs. Some items may interact with your medicine. What should I watch for while using this  medication? Visit your doctor for checks on your progress. This drug may make you feel generally unwell. This is not uncommon, as chemotherapy can affect healthy cells as well as cancer cells. Report any side effects. Continue your course of treatment even though you feel ill unless your doctor tells you to stop. In some cases, you may be given additional medicines to help with side effects. Follow all directions for their use. Call your doctor or health care professional for advice if you get a fever, chills or sore throat, or other symptoms of a cold or flu. Do not treat yourself. This drug decreases your body's ability to fight infections. Try to avoid being around people who are sick. This medicine may increase your risk to bruise or bleed. Call your doctor or health care professional if you notice any unusual bleeding. Be careful brushing and flossing your teeth or using a toothpick because you may get an infection or bleed more easily. If you have any dental work done, tell your dentist you are receiving this medicine. Avoid taking products that contain aspirin, acetaminophen, ibuprofen, naproxen, or ketoprofen unless instructed by your doctor. These medicines may hide a fever. Call your doctor or health care professional if you get diarrhea or mouth sores. Do not treat yourself. To protect your kidneys, drink water or other fluids as directed while you are taking this medicine. Do not become pregnant while taking this medicine or for 6 months after stopping it. Women should inform their doctor if they wish to become pregnant or think they might be pregnant. Men should not father a child while taking  this medicine and for 3 months after stopping it. This may interfere with the ability to father a child. You should talk to your doctor or health care professional if you are concerned about your fertility. There is a potential for serious side effects to an unborn child. Talk to your health care  professional or pharmacist for more information. Do not breast-feed an infant while taking this medicine or for 1 week after stopping it. What side effects may I notice from receiving this medication? Side effects that you should report to your doctor or health care professional as soon as possible: allergic reactions like skin rash, itching or hives, swelling of the face, lips, or tongue breathing problems redness, blistering, peeling or loosening of the skin, including inside the mouth signs and symptoms of bleeding such as bloody or black, tarry stools; red or dark-brown urine; spitting up blood or brown material that looks like coffee grounds; red spots on the skin; unusual bruising or bleeding from the eye, gums, or nose signs and symptoms of infection like fever or chills; cough; sore throat; pain or trouble passing urine signs and symptoms of kidney injury like trouble passing urine or change in the amount of urine signs and symptoms of liver injury like dark yellow or brown urine; general ill feeling or flu-like symptoms; light-colored stools; loss of appetite; nausea; right upper belly pain; unusually weak or tired; yellowing of the eyes or skin Side effects that usually do not require medical attention (report to your doctor or health care professional if they continue or are bothersome): constipation mouth sores nausea, vomiting unusually weak or tired This list may not describe all possible side effects. Call your doctor for medical advice about side effects. You may report side effects to FDA at 1-800-FDA-1088. Where should I keep my medication? This drug is given in a hospital or clinic and will not be stored at home. NOTE: This sheet is a summary. It may not cover all possible information. If you have questions about this medicine, talk to your doctor, pharmacist, or health care provider.  2022 [employer]/Gold Standard (2017-07-25 16:11:33) Carboplatin injection What is this  medication? CARBOPLATIN (KAR boe pla tin) is a chemotherapy drug. It targets fast dividing cells, like cancer cells, and causes these cells to die. This medicine is used to treat ovarian cancer and many other cancers. This medicine may be used for other purposes; ask your health care provider or pharmacist if you have questions. COMMON BRAND NAME(S): Paraplatin What should I tell my care team before I take this medication? They need to know if you have any of these conditions: blood disorders hearing problems kidney disease recent or ongoing radiation therapy an unusual or allergic reaction to carboplatin, cisplatin, other chemotherapy, other medicines, foods, dyes, or preservatives pregnant or trying to get pregnant breast-feeding How should I use this medication? This drug is usually given as an infusion into a vein. It is administered in a hospital or clinic by a specially trained health care professional. Talk to your pediatrician regarding the use of this medicine in children. Special care may be needed. Overdosage: If you think you have taken too much of this medicine contact a poison control center or emergency room at once. NOTE: This medicine is only for you. Do not share this medicine with others. What if I miss a dose? It is important not to miss a dose. Call your doctor or health care professional if you are unable to keep an appointment. What may  interact with this medication? medicines for seizures medicines to increase blood counts like filgrastim, pegfilgrastim, sargramostim some antibiotics like amikacin, gentamicin, neomycin, streptomycin, tobramycin vaccines Talk to your doctor or health care professional before taking any of these medicines: acetaminophen aspirin ibuprofen ketoprofen naproxen This list may not describe all possible interactions. Give your health care provider a list of all the medicines, herbs, non-prescription drugs, or dietary supplements you use.  Also tell them if you smoke, drink alcohol, or use illegal drugs. Some items may interact with your medicine. What should I watch for while using this medication? Your condition will be monitored carefully while you are receiving this medicine. You will need important blood work done while you are taking this medicine. This drug may make you feel generally unwell. This is not uncommon, as chemotherapy can affect healthy cells as well as cancer cells. Report any side effects. Continue your course of treatment even though you feel ill unless your doctor tells you to stop. In some cases, you may be given additional medicines to help with side effects. Follow all directions for their use. Call your doctor or health care professional for advice if you get a fever, chills or sore throat, or other symptoms of a cold or flu. Do not treat yourself. This drug decreases your body's ability to fight infections. Try to avoid being around people who are sick. This medicine may increase your risk to bruise or bleed. Call your doctor or health care professional if you notice any unusual bleeding. Be careful brushing and flossing your teeth or using a toothpick because you may get an infection or bleed more easily. If you have any dental work done, tell your dentist you are receiving this medicine. Avoid taking products that contain aspirin, acetaminophen, ibuprofen, naproxen, or ketoprofen unless instructed by your doctor. These medicines may hide a fever. Do not become pregnant while taking this medicine. Women should inform their doctor if they wish to become pregnant or think they might be pregnant. There is a potential for serious side effects to an unborn child. Talk to your health care professional or pharmacist for more information. Do not breast-feed an infant while taking this medicine. What side effects may I notice from receiving this medication? Side effects that you should report to your doctor or health  care professional as soon as possible: allergic reactions like skin rash, itching or hives, swelling of the face, lips, or tongue signs of infection - fever or chills, cough, sore throat, pain or difficulty passing urine signs of decreased platelets or bleeding - bruising, pinpoint red spots on the skin, black, tarry stools, nosebleeds signs of decreased red blood cells - unusually weak or tired, fainting spells, lightheadedness breathing problems changes in hearing changes in vision chest pain high blood pressure low blood counts - This drug may decrease the number of white blood cells, red blood cells and platelets. You may be at increased risk for infections and bleeding. nausea and vomiting pain, swelling, redness or irritation at the injection site pain, tingling, numbness in the hands or feet problems with balance, talking, walking trouble passing urine or change in the amount of urine Side effects that usually do not require medical attention (report to your doctor or health care professional if they continue or are bothersome): hair loss loss of appetite metallic taste in the mouth or changes in taste This list may not describe all possible side effects. Call your doctor for medical advice about side effects. You may report  side effects to FDA at 1-800-FDA-1088. Where should I keep my medication? This drug is given in a hospital or clinic and will not be stored at home. NOTE: This sheet is a summary. It may not cover all possible information. If you have questions about this medicine, talk to your doctor, pharmacist, or health care provider.  2022 Elsevier/Gold Standard (2007-09-10 14:38:05) Vitamin B12 Injection What is this medication? Vitamin B12 (VAHY tuh min B12) prevents and treats low vitamin B12 levels in your body. It is used in people who do not get enough vitamin B12 from their diet or when their digestive tract does not absorb enough. Vitamin B12 plays an important  role in maintaining the health of your nervous system and red blood cells. This medicine may be used for other purposes; ask your health care provider or pharmacist if you have questions. COMMON BRAND NAME(S): B-12 Compliance Kit, B-12 Injection Kit, Cyomin, Dodex, LA-12, Nutri-Twelve, Physicians EZ Use B-12, Primabalt What should I tell my care team before I take this medication? They need to know if you have any of these conditions: Kidney disease Leber's disease Megaloblastic anemia An unusual or allergic reaction to cyanocobalamin, cobalt, other medications, foods, dyes, or preservatives Pregnant or trying to get pregnant Breast-feeding How should I use this medication? This medication is injected into a muscle or deeply under the skin. It is usually given in a clinic or care team's office. However, your care team may teach you how to inject yourself. Follow all instructions. Talk to your care team about the use of this medication in children. Special care may be needed. Overdosage: If you think you have taken too much of this medicine contact a poison control center or emergency room at once. NOTE: This medicine is only for you. Do not share this medicine with others. What if I miss a dose? If you are given your dose at a clinic or care team's office, call to reschedule your appointment. If you give your own injections, and you miss a dose, take it as soon as you can. If it is almost time for your next dose, take only that dose. Do not take double or extra doses. What may interact with this medication? Colchicine Heavy alcohol intake This list may not describe all possible interactions. Give your health care provider a list of all the medicines, herbs, non-prescription drugs, or dietary supplements you use. Also tell them if you smoke, drink alcohol, or use illegal drugs. Some items may interact with your medicine. What should I watch for while using this medication? Visit your care team  regularly. You may need blood work done while you are taking this medication. You may need to follow a special diet. Talk to your care team. Limit your alcohol intake and avoid smoking to get the best benefit. What side effects may I notice from receiving this medication? Side effects that you should report to your care team as soon as possible: Allergic reactions-skin rash, itching, hives, swelling of the face, lips, tongue, or throat Swelling of the ankles, hands, or feet Trouble breathing Side effects that usually do not require medical attention (report to your care team if they continue or are bothersome): Diarrhea This list may not describe all possible side effects. Call your doctor for medical advice about side effects. You may report side effects to FDA at 1-800-FDA-1088. Where should I keep my medication? Keep out of the reach of children. Store at room temperature between 15 and 30 degrees C (  59 and 85 degrees F). Protect from light. Throw away any unused medication after the expiration date. NOTE: This sheet is a summary. It may not cover all possible information. If you have questions about this medicine, talk to your doctor, pharmacist, or health care provider.  2022 Elsevier/Gold Standard (2020-07-26 11:47:06)

## 2021-03-10 NOTE — Telephone Encounter (Signed)
Notified Patient and [facility] of Prior Authorization approval for Lidocaine-Prilocaine 2.5% Cream. Medication is approved through 06/08/2021

## 2021-03-10 NOTE — Progress Notes (Signed)
DISCONTINUE ON PATHWAY REGIMEN - Non-Small Cell Lung     A cycle is every 28 days:     Osimertinib   **Always confirm dose/schedule in your pharmacy ordering system**  REASON: Disease Progression PRIOR TREATMENT: [id number]: Osimertinib 80 mg PO Once Daily Until Progression or Unacceptable Toxicity TREATMENT RESPONSE: Partial Response (PR)  START OFF PATHWAY REGIMEN - Non-Small Cell Lung   [id number]:Carboplatin AUC=6 + Pemetrexed 500 mg/m2 q21 days:   A cycle is every 21 days:     Pemetrexed      Carboplatin   **Always confirm dose/schedule in your pharmacy ordering system**  Patient Characteristics: Stage IV Metastatic, Nonsquamous, Molecular Analysis Completed, Molecular Alteration Present and Eligible for Molecular Targeted Therapy, Second Line - Molecular Targeted Therapy, EGFR Mutation - Common (Exon 19 Deletion or Exon 21 L858R Substitution),  T790M Negative/Unknown Therapeutic Status: Stage IV Metastatic Histology: Nonsquamous Cell Broad Molecular Profiling Status: Molecular Analysis Completed Molecular Analysis Results: Alteration Present and Eligible for Molecular Targeted Therapy Molecular Alteration Present: EGFR Mutation - Common (Exon 19 Deletion or Exon 21 L858R Substitution) Molecular Targeted Line of Therapy: Second Paediatric nurse Therapy T790M Mutation Status: Negative Following EGFR TKI Intent of Therapy: Non-Curative / Palliative Intent, Discussed with Patient

## 2021-03-10 NOTE — Progress Notes (Signed)
[facility] [phone]   [phone]) [phone]  OFFICE PROGRESS NOTE  Ronnell Freshwater, NP [place] [address]  DIAGNOSIS: Stage IV (T2 a,N2, M1a) non-small cell lung cancer, adenocarcinoma diagnosed in July 2019 and presented with right upper lobe lung mass in addition to mediastinal lymphadenopathy as well as bilateral pulmonary nodules and malignant right pleural effusion.  Biomarker Findings Microsatellite status - Cannot Be Determined Tumor Mutational Burden - Cannot Be Determined Genomic Findings For a complete list of the genes assayed, please refer to the Appendix. EGFR exon 19 deletion ([id number]) TP53 Y220C 7 Disease relevant genes with no reportable alterations: KRAS, ALK, BRAF, MET, RET, ERBB2, ROS1   PRIOR THERAPY:  1) Status post right Pleurx catheter placement by Dr. Prescott Gum for drainage of malignant right pleural effusion. 2) palliative radiotherapy to the enlarging right upper lobe lung mass and mediastinum under the care of Dr. Sondra Come expected to be completed on January 05, 2021. 3) Tagrisso 80 mg p.o. daily.  First dose was given on 01/29/2018.  Status post 37 months of treatment.  CURRENT THERAPY: Systemic chemotherapy with carboplatin for AUC of 5 and Alimta 500 Mg/M2 every 3 weeks.  First dose 03/17/2021.  The patient will also continue her current treatment with Tagrisso 80 mg p.o. daily.  INTERVAL HISTORY: Allison [name] 70 y.o. female returns to the clinic today for follow-up visit accompanied by her daughter.  Her other daughter was available by phone during the visit.  The patient continues to complain of the shortness of breath but improved compared to 2 weeks ago.  She also has dry cough and does not take Hycodan as prescribed.  She uses Robitussin DM instead.  She has no current chest pain or hemoptysis.  She denied having any fever or chills.  She has no nausea, vomiting, diarrhea or constipation.   She denied having any headache or visual changes.  She has no recent weight loss or night sweats.  She is currently on a tapered dose of prednisone and she is on 30 mg p.o. daily.  This will change to 20 mg next week and then 10 mg the following week.  She had repeat CT scan of the chest performed recently and she is here for evaluation and discussion of her scan results.   MEDICAL HISTORY: Past Medical History:  Diagnosis Date   Anxiety    Arthritis    Asthma    exercise induced   Depression    PMH   GERD (gastroesophageal reflux disease)    Glaucoma    History of radiation therapy 01/05/2021   IMRT right lung  11/24/2020-01/05/2021  Dr [name]   Hypertension    lung ca dx'd 11/2017   right   Malignant pleural effusion    right   PONV (postoperative nausea and vomiting)    Pre-diabetes    Raynaud's disease    Raynaud's disease     ALLERGIES:  is allergic to penicillins, vicodin [hydrocodone-acetaminophen], and other.  MEDICATIONS:  Current Outpatient Medications  Medication Sig Dispense Refill   acetaminophen (TYLENOL) 500 MG tablet Take 1,000 mg by mouth every 6 (six) hours as needed (pain).     albuterol (VENTOLIN HFA) 108 (90 Base) MCG/ACT inhaler Inhale 2 puffs into the lungs every 6 (six) hours as needed for wheezing or shortness of breath. (Patient not taking: Reported on 03/04/2021) 1 each 2   ALPRAZolam (XANAX) 0.25 MG tablet Take  0.25 mg by mouth 2 (two) times daily as needed for anxiety.      amLODipine (NORVASC) 5 MG tablet Take 5 mg by mouth daily.     aspirin 81 MG chewable tablet Chew 1 tablet (81 mg total) by mouth daily.     b complex vitamins capsule Take 1 capsule by mouth daily.     benzonatate (TESSALON) 200 MG capsule Take 1 capsule (200 mg total) by mouth 3 (three) times daily as needed for cough. (Patient not taking: Reported on 03/04/2021) 20 capsule 0   carvedilol (COREG) 6.25 MG tablet TAKE 1 TABLET BY MOUTH 2 TIMES DAILY WITH A MEAL. PLEASE SCHEDULE  APPOINTMENT FOR FUTURE REFILLS. (Patient taking differently: Take 6.25 mg by mouth 2 (two) times daily with a meal.) 180 tablet 1   cetirizine (ZYRTEC) 5 MG tablet Take 5 mg by mouth daily.     docusate sodium (COLACE) 100 MG capsule Take 1 capsule (100 mg total) by mouth daily. (Patient not taking: Reported on 03/04/2021) 30 capsule 0   dorzolamide-timolol (COSOPT) 22.3-6.8 MG/ML ophthalmic solution Place 1 drop into both eyes 2 (two) times daily.     enoxaparin (LOVENOX) 60 MG/0.6ML injection Inject 0.6 mLs (60 mg total) into the skin every 12 (twelve) hours. 60 mL 1   esomeprazole (NEXIUM) 20 MG packet Take 20 mg by mouth daily.     ezetimibe (ZETIA) 10 MG tablet Take 10 mg by mouth daily.      guaiFENesin-dextromethorphan (ROBITUSSIN DM) 100-10 MG/5ML syrup Take 10 mLs by mouth 4 (four) times daily. 118 mL 0   hydrochlorothiazide (HYDRODIURIL) 25 MG tablet Take 1 tablet (25 mg total) by mouth daily. 90 tablet 3   HYDROcodone bit-homatropine (HYCODAN) 5-1.5 MG/5ML syrup Take 5 mLs by mouth every 4 (four) hours as needed for cough. 120 mL 0   isosorbide mononitrate (IMDUR) 30 MG 24 hr tablet Take 1 tablet (30 mg total) by mouth daily. 15 tablet 0   latanoprost (XALATAN) 0.005 % ophthalmic solution Place 1 drop into both eyes at bedtime.      menthol-cetylpyridinium (CEPACOL) 3 MG lozenge Take 1 lozenge (3 mg total) by mouth as needed for sore throat. 100 tablet 12   mometasone-formoterol (DULERA) 100-5 MCG/ACT AERO Inhale 2 puffs into the lungs 2 (two) times daily. 1 each 0   morphine 20 MG/5ML solution Take 0.6 mLs (2.4 mg total) by mouth every 4 (four) hours as needed (shortness of breath). 30 mL 0   predniSONE (DELTASONE) 10 MG tablet Take 83m daily for 3days, Take 517mdaily for 7days,Take 4017maily for 7days,Take 17m32mily for 7days,Take 20mg64mly for 7days,Take 10mg 73my for 7days, then stop 120 tablet 0   prochlorperazine (COMPAZINE) 10 MG tablet Take 1 tablet (10 mg total) by mouth  every 6 (six) hours as needed for nausea or vomiting. (Patient not taking: Reported on 03/04/2021) 30 tablet 0   sucralfate (CARAFATE) 1 g tablet Take 1 tablet (1 g total) by mouth 4 (four) times daily. with meals and at bedtime. Crush and dissolve in 10 mL of warm water prior to swallowing (Patient not taking: Reported on 03/04/2021) 120 tablet 0   VITAMIN D-VITAMIN K PO Take 1 tablet by mouth daily.     No current facility-administered medications for this visit.    SURGICAL HISTORY:  Past Surgical History:  Procedure Laterality Date   ABDOMINAL HYSTERECTOMY     partial   CHEST TUBE INSERTION Right 01/01/2018   Procedure: INSERTION PLEURAL  DRAINAGE CATHETER;  Surgeon: Ivin Poot, MD;  Location: [facility];  Service: Thoracic;  Laterality: Right;   COLONOSCOPY     CORONARY STENT INTERVENTION N/A 06/16/2019   Procedure: CORONARY STENT INTERVENTION;  Surgeon: Jettie Booze, MD;  Location: [facility];  Service: Cardiovascular;  Laterality: N/A;   DILATION AND CURETTAGE OF UTERUS     EYE SURGERY     due to Glaucoma   LEFT HEART CATH AND CORONARY ANGIOGRAPHY N/A 06/16/2019   Procedure: LEFT HEART CATH AND CORONARY ANGIOGRAPHY;  Surgeon: Jettie Booze, MD;  Location: [facility];  Service: Cardiovascular;  Laterality: N/A;   REMOVAL OF PLEURAL DRAINAGE CATHETER Right 11/07/2018   Procedure: REMOVAL OF PLEURAL DRAINAGE CATHETER;  Surgeon: Ivin Poot, MD;  Location: [facility];  Service: Thoracic;  Laterality: Right;   ROTATOR CUFF REPAIR     TUBAL LIGATION     WISDOM TOOTH EXTRACTION      REVIEW OF SYSTEMS:  Constitutional: positive for fatigue Eyes: negative Ears, nose, mouth, throat, and face: negative Respiratory: positive for cough and dyspnea on exertion Cardiovascular: negative Gastrointestinal: negative Genitourinary:negative Integument/breast: negative Hematologic/lymphatic: negative Musculoskeletal:negative Neurological: negative Behavioral/Psych:  negative Endocrine: negative Allergic/Immunologic: negative   PHYSICAL EXAMINATION: General appearance: alert, cooperative, fatigued, and no distress Head: Normocephalic, without obvious abnormality, atraumatic Neck: no adenopathy, no JVD, supple, symmetrical, trachea midline, and thyroid not enlarged, symmetric, no tenderness/mass/nodules Lymph nodes: Cervical, supraclavicular, and axillary nodes normal. Resp: rales RLL Back: symmetric, no curvature. ROM normal. No CVA tenderness. Cardio: regular rate and rhythm, S1, S2 normal, no murmur, click, rub or gallop GI: soft, non-tender; bowel sounds normal; no masses,  no organomegaly Extremities: extremities normal, atraumatic, no cyanosis or edema Neurologic: Alert and oriented X 3, normal strength and tone. Normal symmetric reflexes. Normal coordination and gait  ECOG PERFORMANCE STATUS: 1 - Symptomatic but completely ambulatory  Blood pressure (!) 144/80, pulse 94, temperature (!) 97 F (36.1 C), temperature source Tympanic, resp. rate 18, weight 125 lb 1 oz (56.7 kg), SpO2 94 %.  LABORATORY DATA: Lab Results  Component Value Date   WBC 11.8 (H) 02/23/2021   HGB 12.1 02/23/2021   HCT 37.5 02/23/2021   MCV 81.9 02/23/2021   PLT 248 02/23/2021      Chemistry      Component Value Date/Time   NA 140 03/04/2021 1516   K 4.6 03/04/2021 1516   CL 100 03/04/2021 1516   CO2 25 03/04/2021 1516   BUN 21 03/04/2021 1516   CREATININE 1.02 (H) 03/04/2021 1516   CREATININE 1.05 (H) 02/23/2021 0844      Component Value Date/Time   CALCIUM 9.9 03/04/2021 1516   ALKPHOS 55 02/23/2021 0844   AST 29 02/23/2021 0844   ALT 87 (H) 02/23/2021 0844   BILITOT 0.4 02/23/2021 0844       RADIOGRAPHIC STUDIES: CT ABDOMEN PELVIS WO CONTRAST  Result Date: 02/12/2021 CLINICAL DATA:  Abdominal pain. EXAM: CT ABDOMEN AND PELVIS WITHOUT CONTRAST TECHNIQUE: Multidetector CT imaging of the abdomen and pelvis was performed following the standard  protocol without IV contrast. COMPARISON:  CT abdomen pelvis dated/CT dated 10/11/2020. FINDINGS: Evaluation of this exam is limited in the absence of intravenous contrast. Lower chest: There is consolidative changes of the majority of the visualized right lung base. A partially seen right-sided pleural effusion and diffusely thickened appearance of the right pleural, likely representing malignant effusion and pleural implants. Innumerable nodules within the left lung base may represent  metastatic disease although an atypical infection is not excluded. These findings are new compared to the prior CT of 10/11/2020 most consistent with progression of metastatic disease. There is coronary vascular calcification. No intra-abdominal free air or free fluid. Hepatobiliary: No focal liver abnormality is seen. No gallstones, gallbladder wall thickening, or biliary dilatation. Pancreas: Unremarkable. No pancreatic ductal dilatation or surrounding inflammatory changes. Spleen: Normal in size without focal abnormality. Adrenals/Urinary Tract: Adrenal glands are unremarkable. Kidneys are normal, without renal calculi, focal lesion, or hydronephrosis. Bladder is unremarkable. Stomach/Bowel: Small scattered sigmoid diverticula without active inflammatory changes. There is moderate stool throughout the colon. There is no bowel obstruction or active inflammation. The appendix is not visualized with certainty. No inflammatory changes identified in the right lower quadrant. Vascular/Lymphatic: Mild aortoiliac atherosclerotic disease the IVC is unremarkable. Portal venous gas. There is no adenopathy. Reproductive: Hysterectomy.  No adnexal masses. Other: None Musculoskeletal: Degenerative changes of the spine. There is a transitional vertebra. There is disc desiccation and vacuum phenomena at L5-S1. Lower lumbar facet arthropathy. No acute osseous pathology. IMPRESSION: 1. No acute intra-abdominal or pelvic pathology. No evidence of  metastatic disease within the abdomen or pelvis. 2. Colonic diverticulosis. No bowel obstruction. 3. Progression of metastatic disease in the lung bases. 4. Aortic Atherosclerosis (ICD10-I70.0). Electronically Signed   By: Anner Crete M.D.   On: 02/12/2021 20:39   CT ANGIO HEAD W OR WO CONTRAST  Result Date: 02/13/2021 CLINICAL DATA:  Follow-up examination for acute stroke. EXAM: CT ANGIOGRAPHY HEAD AND NECK TECHNIQUE: Multidetector CT imaging of the head and neck was performed using the standard protocol during bolus administration of intravenous contrast. Multiplanar CT image reconstructions and MIPs were obtained to evaluate the vascular anatomy. Carotid stenosis measurements (when applicable) are obtained utilizing NASCET criteria, using the distal internal carotid diameter as the denominator. CONTRAST:  50m OMNIPAQUE IOHEXOL 350 MG/ML SOLN COMPARISON:  Prior MRI from 02/11/2021 and CT from 02/12/2021. FINDINGS: CTA NECK FINDINGS Aortic arch: Visualized aortic arch normal in caliber with normal 3 vessel morphology. No stenosis seen about the origin of the great vessels. Right carotid system: Right common and internal carotid arteries patent without stenosis, dissection or occlusion. Left carotid system: Left common and internal carotid arteries patent without stenosis, dissection, or occlusion. Mild eccentric calcified plaque about the left bifurcation without significant stenosis. Vertebral arteries: Both vertebral arteries arise from the subclavian arteries. Dominant right vertebral artery widely patent within the neck without abnormality. Left vertebral artery patent proximally, but becomes increasingly attenuated as it courses cephalad, and occludes by the level of the skull base. This could reflect an underlying dissection. Skeleton: No visible acute osseous finding. No discrete or worrisome osseous lesions. Degenerative spondylosis noted at C4-5 through C6-7 without high-grade spinal stenosis.  Degenerative changes noted about the left TMJ. Other neck: No other acute soft tissue abnormality within the neck. No mass or adenopathy. Few scattered right thyroid nodules noted, largest of which measures 11 mm. Findings of doubtful significance given size and patient age, no follow-up imaging recommended (ref: J Am Coll Radiol. 2015 Feb;12(2): 143-50). Upper chest: Previously identified spiculated mass at the right lung apex again seen, similar to previous. Circumferential pleuroparenchymal thickening about the right lung with probable loculated fluid along the right major fissure, partially visualized, and concerning for pleural involvement of disease. Diffusely irregular nodularity and interlobular septal thickening again seen, consistent with lymphangitic spread of tumor. Irregular nodularity and ground-glass opacity within the visualized left lung could be related to lymphangitic spread  of tumor and/or infection. Review of the MIP images confirms the above findings CTA HEAD FINDINGS Anterior circulation: Petrous segments patent bilaterally. Mild atheromatous change within the carotid siphons without hemodynamically significant stenosis. Tiny 2 mm outpouching arising from the cavernous left ICA could reflect a small aneurysm versus vascular infundibulum (series 6, image 225). Left A1 segment widely patent. Right A1 hypoplastic and/or absent. Normal anterior communicating artery complex. Both ACAs patent to their distal aspects, with the left being dominant. No M1 stenosis or occlusion. Normal MCA bifurcations. Distal MCA branches perfused and symmetric, with no visible proximal MCA branch occlusion. Posterior circulation: Right vertebral artery patent to the vertebrobasilar junction without stenosis. Right PICA patent. Left vertebral artery occluded at the skull base. Minimal retrograde filling into the distal left V4 segment across the vertebrobasilar junction noted (series 6, image 209). Left PICA not  visualized. Basilar patent to its distal aspect without stenosis. Superior cerebral arteries patent bilaterally. Left PCA supplied via the basilar. Right PCA supplied via the basilar as well as a prominent right posterior communicating artery. PCAs patent to their distal aspects without stenosis. Venous sinuses: Grossly patent allowing for timing the contrast bolus. Anatomic variants: None significant. Review of the MIP images confirms the above findings IMPRESSION: 1. Increasing attenuation within the left vertebral artery as it courses cephalad within the neck, with occlusion at the skull base. Finding is age indeterminate, but could reflect changes of an underlying dissection. 2. Mild atheromatous change elsewhere about the major arterial vasculature of the head and neck. No other large vessel occlusion. No other proximal high-grade or correctable stenosis. 3. 2 mm outpouching arising from the cavernous left ICA, which could reflect a small aneurysm versus vascular infundibulum. 4. Spiculated right apical mass, consistent with known malignancy. Diffuse pleuroparenchymal thickening about the right lung with scattered interlobular septal thickening and nodularity within the remainder of the visualized lungs, concerning for lymphangitic spread of tumor. Superimposed infection could be considered in the correct clinical setting. Electronically Signed   By: Jeannine Boga M.D.   On: 02/13/2021 02:54   DG Chest 2 View  Result Date: 02/13/2021 CLINICAL DATA:  Shortness of breath for several weeks. History of lung cancer. EXAM: CHEST - 2 VIEW COMPARISON:  Chest x-ray dated 02/07/2021. Chest CT dated 02/07/2021. FINDINGS: Heart size and mediastinal contours are stable. Dense opacity within the RIGHT lower lung is unchanged, corresponding to the mix of consolidations and pleural effusion better demonstrated on earlier CT. Additional mild interstitial prominence throughout the RIGHT upper lobe and LEFT lung is  stable. Spiculated mass within the RIGHT lung apex is better demonstrated on earlier chest CT. IMPRESSION: 1. Stable chest x-ray. 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 2. Interstitial prominence bilaterally, suspicious for diffuse lymphangitic spread of malignancy. 3. Spiculated mass within the RIGHT lung apex is better demonstrated on earlier chest CT of 02/07/2021. Electronically Signed   By: Franki Cabot M.D.   On: 02/13/2021 15:25   CT HEAD WO CONTRAST (5MM)  Result Date: 02/12/2021 CLINICAL DATA:  Evaluate for possible bleed. EXAM: CT HEAD WITHOUT CONTRAST TECHNIQUE: Contiguous axial images were obtained from the base of the skull through the vertex without intravenous contrast. COMPARISON:  Head CT dated 02/11/2021. FINDINGS: Brain: Similar appearance of an ill-defined small hypodense focus in the right cerebellum. The  ventricles and sulci are otherwise appropriate size for patient's age. There is no acute intracranial hemorrhage. No mass effect or midline shift no extra-axial fluid collection. Vascular: No hyperdense vessel or unexpected calcification. Skull: Normal. Negative for fracture or focal lesion. Sinuses/Orbits: No acute finding. Other: None IMPRESSION: Similar appearance of an ill-defined small hypodense focus in the right cerebellum. No acute intracranial hemorrhage. Electronically Signed   By: Anner Crete M.D.   On: 02/12/2021 04:00   CT Head Wo Contrast  Result Date: 02/11/2021 CLINICAL DATA:  Sensation of everything in body being pulled to the left EXAM: CT HEAD WITHOUT CONTRAST TECHNIQUE: Contiguous axial images were obtained from the base of the skull through the vertex without intravenous contrast. COMPARISON:  MR brain dated  11/09/2020 FINDINGS: Brain: No evidence of acute infarction, hemorrhage, hydrocephalus, extra-axial collection or mass effect. However, there is a subtle possible 8 mm hypodense in the right cerebellum (series 2/image 9), not evident on prior MR, raising concern for small acute/subacute infarct or possibly a metastasis given the history of prior lung cancer. Vascular: No hyperdense vessel or unexpected calcification. Skull: Normal. Negative for fracture or focal lesion. Sinuses/Orbits: The visualized paranasal sinuses are essentially clear. The mastoid air cells are unopacified. Other: None. IMPRESSION: Possible 8 mm lesion in the right cerebellum, equivocal. This was not evident on prior MRI, raising concern for small acute/subacute infarct or possibly a metastasis. Consider MRI for further characterization. Electronically Signed   By: Julian Hy M.D.   On: 02/11/2021 19:11   CT ANGIO NECK W OR WO CONTRAST  Result Date: 02/13/2021 CLINICAL DATA:  Follow-up examination for acute stroke. EXAM: CT ANGIOGRAPHY HEAD AND NECK TECHNIQUE: Multidetector CT imaging of the head and neck was performed using the standard protocol during bolus administration of intravenous contrast. Multiplanar CT image reconstructions and MIPs were obtained to evaluate the vascular anatomy. Carotid stenosis measurements (when applicable) are obtained utilizing NASCET criteria, using the distal internal carotid diameter as the denominator. CONTRAST:  40m OMNIPAQUE IOHEXOL 350 MG/ML SOLN COMPARISON:  Prior MRI from 02/11/2021 and CT from 02/12/2021. FINDINGS: CTA NECK FINDINGS Aortic arch: Visualized aortic arch normal in caliber with normal 3 vessel morphology. No stenosis seen about the origin of the great vessels. Right carotid system: Right common and internal carotid arteries patent without stenosis, dissection or occlusion. Left carotid system: Left common and internal carotid arteries patent without stenosis, dissection, or  occlusion. Mild eccentric calcified plaque about the left bifurcation without significant stenosis. Vertebral arteries: Both vertebral arteries arise from the subclavian arteries. Dominant right vertebral artery widely patent within the neck without abnormality. Left vertebral artery patent proximally, but becomes increasingly attenuated as it courses cephalad, and occludes by the level of the skull base. This could reflect an underlying dissection. Skeleton: No visible acute osseous finding. No discrete or worrisome osseous lesions. Degenerative spondylosis noted at C4-5 through C6-7 without high-grade spinal stenosis. Degenerative changes noted about the left TMJ. Other neck: No other acute soft tissue abnormality within the neck. No mass or adenopathy. Few scattered right thyroid nodules noted, largest of which measures 11 mm. Findings of doubtful significance given size and patient age, no follow-up imaging recommended (ref: J Am Coll Radiol. 2015 Feb;12(2): 143-50). Upper chest: Previously identified spiculated mass at the right lung apex again seen, similar to previous. Circumferential pleuroparenchymal thickening about the right lung with probable loculated fluid along the right major fissure, partially visualized, and concerning for pleural involvement of disease. Diffusely irregular nodularity and interlobular  septal thickening again seen, consistent with lymphangitic spread of tumor. Irregular nodularity and ground-glass opacity within the visualized left lung could be related to lymphangitic spread of tumor and/or infection. Review of the MIP images confirms the above findings CTA HEAD FINDINGS Anterior circulation: Petrous segments patent bilaterally. Mild atheromatous change within the carotid siphons without hemodynamically significant stenosis. Tiny 2 mm outpouching arising from the cavernous left ICA could reflect a small aneurysm versus vascular infundibulum (series 6, image 225). Left A1 segment  widely patent. Right A1 hypoplastic and/or absent. Normal anterior communicating artery complex. Both ACAs patent to their distal aspects, with the left being dominant. No M1 stenosis or occlusion. Normal MCA bifurcations. Distal MCA branches perfused and symmetric, with no visible proximal MCA branch occlusion. Posterior circulation: Right vertebral artery patent to the vertebrobasilar junction without stenosis. Right PICA patent. Left vertebral artery occluded at the skull base. Minimal retrograde filling into the distal left V4 segment across the vertebrobasilar junction noted (series 6, image 209). Left PICA not visualized. Basilar patent to its distal aspect without stenosis. Superior cerebral arteries patent bilaterally. Left PCA supplied via the basilar. Right PCA supplied via the basilar as well as a prominent right posterior communicating artery. PCAs patent to their distal aspects without stenosis. Venous sinuses: Grossly patent allowing for timing the contrast bolus. Anatomic variants: None significant. Review of the MIP images confirms the above findings IMPRESSION: 1. Increasing attenuation within the left vertebral artery as it courses cephalad within the neck, with occlusion at the skull base. Finding is age indeterminate, but could reflect changes of an underlying dissection. 2. Mild atheromatous change elsewhere about the major arterial vasculature of the head and neck. No other large vessel occlusion. No other proximal high-grade or correctable stenosis. 3. 2 mm outpouching arising from the cavernous left ICA, which could reflect a small aneurysm versus vascular infundibulum. 4. Spiculated right apical mass, consistent with known malignancy. Diffuse pleuroparenchymal thickening about the right lung with scattered interlobular septal thickening and nodularity within the remainder of the visualized lungs, concerning for lymphangitic spread of tumor. Superimposed infection could be considered in the  correct clinical setting. Electronically Signed   By: Jeannine Boga M.D.   On: 02/13/2021 02:54   CT Chest W Contrast  Result Date: 03/07/2021 CLINICAL DATA:  Non-small cell lung cancer staging EXAM: CT CHEST WITH CONTRAST TECHNIQUE: Multidetector CT imaging of the chest was performed during intravenous contrast administration. CONTRAST:  47m OMNIPAQUE IOHEXOL 350 MG/ML SOLN COMPARISON:  02/04/2021 FINDINGS: Cardiovascular: Aortic atherosclerosis. Normal heart size. Left and right coronary artery calcifications and stents. No pericardial effusion. Mediastinum/Nodes: No significant change in prominent,, previously FDG avid mediastinal lymph nodes, largest pretracheal nodes measuring 1.0 x 0.8 cm (series 2, image 56). Thyroid gland, trachea, and esophagus demonstrate no significant findings. Lungs/Pleura: Unchanged mass of the right pulmonary apex, measuring 2.7 x 1.7 cm (series 5, image 41). Slight interval increase in a moderate, loculated right pleural, with extensive pleural thickening and nodularity, as well as septal thickening and nodularity throughout the lungs, which is increasingly confluent. Very extensive nodularity and septal thickening of the left lung is likewise worsened compared to prior examination. Upper Abdomen: No acute abnormality. Musculoskeletal: No chest wall mass or suspicious bone lesions identified. IMPRESSION: 1. Unchanged treated mass of the right pulmonary apex. 2. Slight interval increase in a moderate, loculated right pleural effusion, with extensive pleural thickening and nodularity, as well as septal thickening and nodularity, as well as septal thickening and nodularity throughout the  lungs, which is increasingly confluent. Very extensive nodularity and septal thickening of the left lung is likewise worsened compared to prior examination. Findings are consistent with worsened pleural and lymphangitic metastatic disease. 3. No significant change in prominent, previously  FDG avid mediastinal lymph nodes. 4. Coronary artery disease. Aortic Atherosclerosis (ICD10-I70.0). Electronically Signed   By: Eddie Candle M.D.   On: 03/07/2021 09:58   MR BRAIN W WO CONTRAST  Result Date: 02/27/2021  [facility] [address], [place], [place] [address] [phone] NEUROIMAGING REPORT STUDY DATE: 02/27/2021 PATIENT NAME: AMELA HANDLEY DOB: 12-Mar-1951 MRN: [id number] EXAM: MRI Brain with and without contrast ORDERING CLINICIAN: Sarina Ill, MD CLINICAL HISTORY: 70 year old woman with history of lung cancer and strokes COMPARISON FILMS: MRI 02/11/2021 TECHNIQUE:MRI of the brain with and without contrast was obtained utilizing 5 mm axial slices with T1, T2, T2 flair, SWI and diffusion weighted views.  T1 sagittal, T2 coronal and postcontrast views in the axial and coronal plane were obtained. CONTRAST: 10 mL MultiHance IMAGING SITE: [facility], [place]. FINDINGS: On sagittal images, the spinal cord is imaged caudally to C4-C5 and is normal in caliber.   The contents of the posterior fossa are of normal size and position.   The pituitary gland and optic chiasm appear normal.    Brain volume appears normal.   The ventricles are normal in size and without distortion.  There are no abnormal extra-axial collections of fluid.  There are multiple foci of restriction on diffusion-weighted images in both hemispheres and the cerebellum consistent with subacute infarction, likely embolic, compared to the MRI from 02/11/2021, there are fewer foci, consistent with natural evolution.  There did not appear to be any new foci not present on the previous MRI.  Elsewhere in the hemispheres, there are other T2/FLAIR hyperintense foci consistent with either prior emboli or chronic microvascular ischemic change.  The deep gray matter appears normal.  Susceptibility weighted images show some chronic heme products in the cerebellum, similar to the previous MRI. The  orbits appear normal.   The VIIth/VIIIth nerve complex appears normal.  The mastoid air cells appear normal.  The paranasal sinuses appear normal.  Flow voids are identified within the major intracerebral arteries.  After the infusion of contrast material, there are some small foci of enhancement.  Several are located in the left occipital lobe and posterior left frontal lobe and right cerebellar hemisphere and are similar in appearance or possibly reduced in size compared to the 02/11/2021 MRI.  However, four more small enhancing foci in the gray matter or the gray-white junction are also noted in the right frontal lobe (series 17 image 115, 120 and 121) and were not clearly present on the previous MRI.   This MRI of the brain with and without contrast shows the following: 1.   Multiple T2/FLAIR hyperintense foci in the hemispheres, some of which have restricted diffusion consistent with subacute infarctions, likely embolic.  Compared to the 02/11/2021 MRI, most of these have evolved and are less hyperintense.  There do not appear to be any new foci on diffusion-weighted images. 2.   Some small foci in the left occipital lobe, left posterior frontal lobe, right frontal lobe and right cerebellar hemisphere enhance after contrast.  The four foci seen in the right frontal lobe was not present on the 02/11/2021 MRI.  Although most likely related to the recent strokes, the foci in the right frontal lobe do not have corresponding DWI changes  and metastasis cannot be ruled out.  Consider follow-up evaluation in several more weeks. INTERPRETING PHYSICIAN: Richard A. Felecia Shelling, MD, PhD, FAAN Certified in  Neuroimaging by [employer]   MR Brain W and Wo Contrast  Result Date: 02/11/2021 CLINICAL DATA:  Initial evaluation for neuro deficit, question CVA versus metastatic disease. History of stage IV lung cancer. EXAM: MRI HEAD WITHOUT AND WITH CONTRAST TECHNIQUE: Multiplanar, multiecho pulse sequences of  the brain and surrounding structures were obtained without and with intravenous contrast. CONTRAST:  12m GADAVIST GADOBUTROL 1 MMOL/ML IV SOLN COMPARISON:  Head CT from earlier the same day as well as previous MRI from 11/09/2020. FINDINGS: Brain: Cerebral volume within normal limits for age. Patchy T2/FLAIR signal abnormality seen involving the periventricular and deep white matter both cerebral hemispheres, nonspecific, but most likely related to chronic microvascular ischemic disease. Multiple scattered foci of diffusion abnormality are seen involving the cerebellum bilaterally, left slightly worse than right (series 5, images 56, 59). Additional patchy multifocal areas of diffusion abnormality seen involving the cortical and subcortical aspect of both cerebral hemispheres, most pronounced at the left occipital lobe (series 5, images 84, 82, 79, 69, 72). Foci involving the frontal parietal regions are somewhat watershed in distribution. Findings consistent with acute to subacute ischemic infarcts. Small focus of intrinsic T1 hyperintensity with susceptibility artifact present at the right cerebellum, consistent with petechial blood products (series 11, image 35 and series 9, image 14). No frank hemorrhagic transformation. No visible hemorrhage on prior CT. Additionally, scattered foci of patchy enhancement seen about several of these areas of ischemia, felt to be most consistent with probable evolving subacute ischemic change (series 15, images 123, 83, 75, 68). No other definite mass lesion or definite evidence for intracranial metastatic disease. No mass effect or midline shift. No hydrocephalus or extra-axial fluid collection. Empty sella noted. Midline structures intact. Vascular: Abnormal flow void within the left vertebral artery, which could be related to slow flow and/or occlusion (series 10, image 2). Enhancement is seen within the left vertebral artery following contrast administration. Major  intracranial vascular flow voids are otherwise maintained. Skull and upper cervical spine: Craniocervical junction within normal limits. Bone marrow signal intensity normal. No focal marrow replacing lesion. No scalp soft tissue abnormality. Sinuses/Orbits: Globes and orbital soft tissues within normal limits. Paranasal sinuses are largely clear. No mastoid effusion. Inner ear structures grossly normal. Other: None. IMPRESSION: 1. Patchy multifocal acute to subacute ischemic infarcts involving the bilateral cerebral and cerebellar hemispheres. Associated small volume petechial blood products at the right cerebellum without frank hemorrhagic transformation. No significant mass effect. 2. Associated scattered areas of patchy enhancement as above, favored to be secondary to subacute ischemic changes. A short interval follow-up MRI to ensure these changes resolve is recommended, particularly given the patient history of stage IV lung cancer and to ensure no underlying metastatic disease is present. 3. Abnormal flow void within the left vertebral artery, which could be related to slow flow and/or occlusion. 4. Underlying chronic microvascular ischemic disease. Electronically Signed   By: BJeannine BogaM.D.   On: 02/11/2021 23:11   ECHOCARDIOGRAM COMPLETE  Result Date: 02/08/2021    ECHOCARDIOGRAM REPORT   Patient Name:   PMARIPAZ MULLANADAMSON Date of Exam: 02/08/2021 Medical Rec #:  [id number]      Height:       64.0 in Accession #:    [id number]     Weight:       127.0 lb Date of  Birth:  1950-07-06       BSA:          1.613 m Patient Age:    39 years        BP:           135/71 mmHg Patient Gender: F               HR:           77 bpm. Exam Location:  Inpatient Procedure: 2D Echo, Cardiac Doppler and Color Doppler Indications:    Pulmonary Embolus I26.09  History:        Patient has prior history of Echocardiogram examinations, most                 recent 06/13/2019. CAD; Signs/Symptoms:Fever and Shortness of                  Breath. GERd. Raynaud's disease.  Sonographer:    Darlina Sicilian RDCS Referring Phys: [place]  1. Left ventricular ejection fraction, by estimation, is 60 to 65%. The left ventricle has normal function. The left ventricle has no regional wall motion abnormalities. Left ventricular diastolic parameters were normal.  2. Right ventricular systolic function is normal. The right ventricular size is normal. There is normal pulmonary artery systolic pressure. The estimated right ventricular systolic pressure is 88.5 mmHg.  3. The mitral valve is normal in structure. Mild to moderate mitral valve regurgitation. No evidence of mitral stenosis.  4. Tricuspid valve regurgitation is mild to moderate.  5. The aortic valve is normal in structure. Aortic valve regurgitation is not visualized. No aortic stenosis is present.  6. The inferior vena cava is normal in size with greater than 50% respiratory variability, suggesting right atrial pressure of 3 mmHg. FINDINGS  Left Ventricle: Left ventricular ejection fraction, by estimation, is 60 to 65%. The left ventricle has normal function. The left ventricle has no regional wall motion abnormalities. The left ventricular internal cavity size was normal in size. There is  no left ventricular hypertrophy. Left ventricular diastolic parameters were normal. Right Ventricle: The right ventricular size is normal. No increase in right ventricular wall thickness. Right ventricular systolic function is normal. There is normal pulmonary artery systolic pressure. The tricuspid regurgitant velocity is 2.62 m/s, and  with an assumed right atrial pressure of 3 mmHg, the estimated right ventricular systolic pressure is 02.7 mmHg. Left Atrium: Left atrial size was normal in size. Right Atrium: Right atrial size was normal in size. Pericardium: There is no evidence of pericardial effusion. Mitral Valve: The mitral valve is normal in structure. Mild to moderate  mitral valve regurgitation. No evidence of mitral valve stenosis. Tricuspid Valve: The tricuspid valve is normal in structure. Tricuspid valve regurgitation is mild to moderate. No evidence of tricuspid stenosis. Aortic Valve: The aortic valve is normal in structure. Aortic valve regurgitation is not visualized. No aortic stenosis is present. Pulmonic Valve: The pulmonic valve was normal in structure. Pulmonic valve regurgitation is trivial. No evidence of pulmonic stenosis. Aorta: The aortic root is normal in size and structure. Venous: The inferior vena cava is normal in size with greater than 50% respiratory variability, suggesting right atrial pressure of 3 mmHg. IAS/Shunts: No atrial level shunt detected by color flow Doppler.  LEFT VENTRICLE PLAX 2D LVIDd:         3.90 cm  Diastology LVIDs:         2.60 cm  LV e' medial:  9.32 cm/s LV PW:         0.60 cm  LV E/e' medial:  12.0 LV IVS:        0.70 cm  LV e' lateral:   10.30 cm/s LVOT diam:     1.80 cm  LV E/e' lateral: 10.8 LV SV:         52 LV SV Index:   32 LVOT Area:     2.54 cm  RIGHT VENTRICLE RV S prime:     11.40 cm/s TAPSE (M-mode): 2.8 cm LEFT ATRIUM             Index       RIGHT ATRIUM           Index LA diam:        3.10 cm 1.92 cm/m  RA Area:     12.30 cm LA Vol (A2C):   28.7 ml 17.79 ml/m RA Volume:   23.80 ml  14.76 ml/m LA Vol (A4C):   32.9 ml 20.40 ml/m LA Biplane Vol: 31.3 ml 19.40 ml/m  AORTIC VALVE LVOT Vmax:   97.50 cm/s LVOT Vmean:  58.400 cm/s LVOT VTI:    0.203 m  AORTA Ao Root diam: 2.70 cm Ao Asc diam:  3.00 cm MITRAL VALVE                 TRICUSPID VALVE MV Area (PHT): 3.72 cm      TR Peak grad:   27.5 mmHg MV Decel Time: 204 msec      TR Vmax:        262.00 cm/s MR Peak grad:    110.7 mmHg MR Mean grad:    81.0 mmHg   SHUNTS MR Vmax:         526.00 cm/s Systemic VTI:  0.20 m MR Vmean:        434.0 cm/s  Systemic Diam: 1.80 cm MR PISA:         0.77 cm MR PISA Eff ROA: 5 mm MR PISA Radius:  0.35 cm MV E velocity: 111.50 cm/s  MV A velocity: 76.15 cm/s MV E/A ratio:  1.46 Cherlynn Kaiser MD Electronically signed by Cherlynn Kaiser MD Signature Date/Time: 02/08/2021/6:03:29 PM    Final    OCT, Retina - OU - Both Eyes  Result Date: 02/28/2021 Right Eye Quality was good. Findings include abnormal foveal contour, epiretinal membrane. Left Eye Quality was good. Progression has no prior data. Findings include normal foveal contour.   Color Fundus Photography Optos - OU - Both Eyes  Result Date: 02/28/2021 Right Eye Progression has no prior data. Disc findings include normal observations, increased cup to disc ratio. Macula : epiretinal membrane. Vessels : normal observations. Periphery : normal observations. Left Eye Progression has no prior data. Disc findings include increased cup to disc ratio. Macula : normal observations. Vessels : normal observations. Periphery : normal observations. Notes Myopic topographic distortion OD  VAS [place] LOWER EXTREMITY VENOUS (DVT)  Result Date: 02/08/2021  Lower Venous DVT Study Patient Name:  KLARISA BARMAN  Date of Exam:   02/08/2021 Medical Rec #: [id number]        Accession #:    [id number] Date of Birth: Jan 26, 1951        Patient Gender: F Patient Age:   56 years Exam Location:  [facility] Procedure:      VAS [place] LOWER EXTREMITY VENOUS (DVT) Referring Phys: Nyoka Lint DOUTOVA --------------------------------------------------------------------------------  Indications: Pulmonary embolism. Other Indications: Patient states she has  been experiencing LLE calf pain and                    heaviness in calf/foot area for about a month. Risk Factors: Cancer (Lung). Comparison Study: No previous exams Performing Technologist: Jody Hill RVT, RDMS  Examination Guidelines: A complete evaluation includes B-mode imaging, spectral Doppler, color Doppler, and power Doppler as needed of all accessible portions of each vessel. Bilateral testing is considered an integral part of a complete examination.  Limited examinations for reoccurring indications may be performed as noted. The reflux portion of the exam is performed with the patient in reverse Trendelenburg.  +---------+---------------+---------+-----------+----------+--------------+ RIGHT    CompressibilityPhasicitySpontaneityPropertiesThrombus Aging +---------+---------------+---------+-----------+----------+--------------+ CFV      Full           Yes      Yes                                 +---------+---------------+---------+-----------+----------+--------------+ SFJ      Full                                                        +---------+---------------+---------+-----------+----------+--------------+ FV Prox  Full           Yes      Yes                                 +---------+---------------+---------+-----------+----------+--------------+ FV Mid   Full           Yes      Yes                                 +---------+---------------+---------+-----------+----------+--------------+ FV DistalFull           Yes      Yes                                 +---------+---------------+---------+-----------+----------+--------------+ PFV      Full                                                        +---------+---------------+---------+-----------+----------+--------------+ POP      Full           Yes      Yes                                 +---------+---------------+---------+-----------+----------+--------------+ PTV      Full                                                        +---------+---------------+---------+-----------+----------+--------------+ PERO     Full                                                        +---------+---------------+---------+-----------+----------+--------------+   +---------+---------------+---------+-----------+----------+--------------+  LEFT     CompressibilityPhasicitySpontaneityPropertiesThrombus Aging  +---------+---------------+---------+-----------+----------+--------------+ CFV      Full           Yes      Yes                                 +---------+---------------+---------+-----------+----------+--------------+ SFJ      Full                                                        +---------+---------------+---------+-----------+----------+--------------+ FV Prox  Full           Yes      Yes                                 +---------+---------------+---------+-----------+----------+--------------+ FV Mid   Full           Yes      Yes                                 +---------+---------------+---------+-----------+----------+--------------+ FV DistalFull           Yes      Yes                                 +---------+---------------+---------+-----------+----------+--------------+ PFV      Full                                                        +---------+---------------+---------+-----------+----------+--------------+ POP      Partial        No       No                   Acute          +---------+---------------+---------+-----------+----------+--------------+ PTV      None           No       No                   Acute          +---------+---------------+---------+-----------+----------+--------------+ PERO     None           No       No                   Acute          +---------+---------------+---------+-----------+----------+--------------+     Summary: BILATERAL: - No evidence of superficial venous thrombosis in the lower extremities, bilaterally. - RIGHT: - There is no evidence of deep vein thrombosis in the lower extremity.  - No cystic structure found in the popliteal fossa.  LEFT: - Findings consistent with acute deep vein thrombosis involving the left popliteal vein, left posterior tibial veins, and left peroneal veins. - A cystic structure is found in the popliteal fossa. - Ultrasound characteristics of a ruptured Baker's Cyst  are noted.  *See  table(s) above for measurements and observations. Electronically signed by Deitra Mayo MD on 02/08/2021 at 2:51:48 PM.    Final    ECHOCARDIOGRAM LIMITED  Result Date: 02/13/2021    ECHOCARDIOGRAM LIMITED REPORT   Patient Name:   TIAH HECKEL Mccullers Date of Exam: 02/13/2021 Medical Rec #:  [id number]       Height:       64.0 in Accession #:    [id number]      Weight:       127.0 lb Date of Birth:  [birth year]       BSA:          1.613 m Patient Age:    9 years        BP:           138/84 mmHg Patient Gender: F               HR:           84 bpm. Exam Location:  Inpatient Procedure: Limited Echo and Color Doppler Indications:    Elevated troponin  History:        Patient has prior history of Echocardiogram examinations, most                 recent 02/08/2021. CAD. H/o DVT and pulmonary embolus.  Sonographer:    Clayton Lefort RDCS (AE) Referring Phys: [id number] [place]  1. Left ventricular ejection fraction, by estimation, is 60 to 65%. The left ventricle has normal function. The left ventricle has no regional wall motion abnormalities.  2. The mitral valve is normal in structure. Mild to moderate mitral valve regurgitation. Comparison(s): No significant change from prior study. Conclusion(s)/Recommendation(s): Limited echo for wall motion/EF, both normal and unchanged from echo 02/08/21. FINDINGS  Left Ventricle: Left ventricular ejection fraction, by estimation, is 60 to 65%. The left ventricle has normal function. The left ventricle has no regional wall motion abnormalities. The left ventricular internal cavity size was normal in size. Mitral Valve: The mitral valve is normal in structure. Mild to moderate mitral valve regurgitation. Buford Dresser MD Electronically signed by Buford Dresser MD Signature Date/Time: 02/13/2021/6:39:03 PM    Final      ASSESSMENT AND PLAN: This is a very pleasant 70 years old never smoker African-American female recently with a stage  IV non-small cell lung cancer, adenocarcinoma with positive EGFR mutation with deletion in exon 42. The patient was started on treatment with Tagrisso 80 mg p.o. daily status post 35 months of treatment. She has been tolerating this treatment well with no concerning adverse effects except for intermittent diarrhea. She had repeat CT scan of the chest, abdomen pelvis performed recently.  I personally and independently reviewed the scans and discussed the results with the patient and her boyfriend today. Unfortunately the CT scan showed interval progression of the right apical lung mass in addition to progression of mediastinal lymphadenopathy concerning for worsening of her disease. She has no actionable resistant mutation on the molecular studies performed by [facility]. The patient continued her current treatment with Tagrisso and tolerating it fairly well. 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 Sondra Come completed January 05, 2021. The patient had significant opacities in her lung that was initially thought to be secondary to radiation treatment versus Tagrisso induced pneumonitis versus lymphangitic spread of the tumor.  She was treated with high-dose taper regiment of prednisone and  she feels a little bit better. Repeat CT scan of the chest performed recently showed unchanged treated mass of the right pulmonary apex and there was a slight interval increase in the moderate loculated right pleural effusion with extensive pleural thickening and nodularity as well as septal thickening and nodularity throughout the lungs which is increasingly confluent.  There was also very extensive nodularity and septal thickening of the left lung worse compared to the prior examination and the finding are consistent with worsened pleural and lymphangitic metastatic disease. I had a lengthy discussion with the  patient and her daughters about her current condition and treatment options.  Her molecular studies by [facility] [employer] recently showed no new resistant mutation.  I recommended for the patient to have repeat bronchoscopy with biopsy of the suspicious lymphangitic spread and nodularity in the right lung for confirmation of the disease progression as well as having enough tissue for molecular studies on the tissue and also to rule out the possibility of development of small cell lung cancer as an escape resistant mechanism for patient with EGFR mutation. I also discussed with the patient her treatment options including palliative care versus palliative systemic chemotherapy with carboplatin for AUC of 5 and Alimta 500 Mg/M2 every 3 weeks.  I would not consider adding Avastin at this point because of her recent cerebrovascular accidents. The patient would like to continue her treatment with Tagrisso and I have no objection to this option as long it is covered by her insurance and I think it is valuable to protect her against progressive brain metastasis. For the hypertension the patient is currently on multiple blood pressure medication.  She has an appointment with her cardiologist early next week for addressing this issue.  I will give her a dose of clonidine 0.2 mg p.o. x1 in the clinic today.  She was advised to take her blood pressure medication as prescribed and to monitor it closely at home. For the cough she was advised to take Hycodan on as-needed basis. She is expected to start the first cycle of her treatment with carboplatin and Alimta next week. She will receive vitamin B12 injection today. I will send the prescription of folic acid 1 mg p.o. daily day in addition to Compazine 10 mg p.o. every 6 hours as needed for nausea to her pharmacy. I will also arrange for the patient to have a Port-A-Cath placed by interventional radiology.  I will send Emla cream to her pharmacy. She will come back for  follow-up visit in 2 weeks for evaluation and management of any adverse effect of her treatment. The patient was advised to call immediately if she has any concerning symptoms in the interval. The total time spent in the appointment was 60 minutes.  The patient voices understanding of current disease status and treatment options and is in agreement with the current care plan. All questions were answered. The patient knows to call the clinic with any problems, questions or concerns. We can certainly see the patient much sooner if necessary.  Disclaimer: This note was dictated with voice recognition software. Similar sounding words can inadvertently be transcribed and may not be corrected upon review.

## 2021-03-10 NOTE — Patient Instructions (Signed)
Cyanocobalamin, Vitamin B12 injection O que  este medicamento? A CIANOCOBALAMINA  uma forma sinttica de vitamina B12. A vitamina B12  essencial para o desenvolvimento de glbulos sanguneos, clulas nervosas e protenas saudveis pelo organismo. Tambm ajuda no metabolismo das gorduras e carboidratos. Este medicamento  usado para tratar pessoas que no conseguem absorver vitamina B12 suficiente. Este medicamento pode ser usado para outros propsitos; em caso de dvidas, pergunte ao seu profissional de sade ou farmacutico. NOMES DE MARCAS COMUNS: B-12 Compliance Kit, B-12 Injection Kit, Cyomin, LA-12, Nutri-Twelve, Physicians EZ Use B-12, Primabalt O que devo dizer a meu profissional de sade antes de tomar este medicamento? Precisam saber se voc tem algum dos seguintes problemas ou estados de sade: doenas renais sndrome ou doena de Leber anemia megaloblstica reao estranha ou alergia  cianocobalamina ou ao cobalto reao estranha ou alergia a outros medicamentos, alimentos, corantes ou conservantes est grvida ou tentando engravidar est amamentando Como devo usar este medicamento? Este medicamento  injetado por via intramuscular ou por injeo subcutnea profunda. Costuma ser administrado por um profissional de sade em consultrio ou Chief of Staff. Porm,  possvel que seu mdico lhe ensine como aplicar suas prprias injees. Siga todas as instrues. Fale com seu pediatra a respeito do uso deste medicamento em crianas. Pode ser preciso tomar alguns cuidados especiais. Superdosagem: Se achar que tomou uma superdosagem deste medicamento, entre em contato imediatamente com o [facility] ou v a [employer]. OBSERVAO: Este medicamento  s para voc. No compartilhe este medicamento com outras pessoas. E se eu deixar de tomar uma dose? Se toma o medicamento em uma clnica ou no consultrio do seu mdico, ligue para Par[employer] a Passenger transport manager. Se aplica as  injees por conta prpria e perdeu uma dose, tome-a assim que possvel. Se j estiver quase na hora da sua prxima dose, tome somente essa dose. No tome o remdio em dobro, nem tome uma dose adicional. O que pode interagir com este medicamento? colchicina consumo pesado de lcool Esta lista pode no descrever todas as interaes possveis. D ao seu profissional de sade uma lista de todos os medicamentos, ervas medicinais, remdios de venda livre, ou suplementos alimentares que voc [place]. Diga tambm se voc fuma, bebe, ou [place] drogas ilcitas. Alguns destes podem interagir com o seu medicamento. Ao que devo ficar atento quando estiver [employer] medicamento? Consulte seu mdico ou profissional de sade para acompanhamento regular Museum/gallery curator. Voc precisar fazer exames de sangue peridicos enquanto estiver [employer]. Voc pode precisar seguir uma dieta especial. Fale com seu mdico. Para conseguir o mximo benefcio deste medicamento, limite o seu consumo de lcool e evite fumar. Que efeitos colaterais posso sentir aps usar este medicamento? Efeitos colaterais que devem ser informados ao seu mdico ou profissional de sade o mais rpido possvel: reaes alrgicas, como erupo na pele, coceira, urticria, ou inchao do rosto, dos lbios ou da lngua pele azulada dor ou aperto no peito chiado no peito ou dificuldade para respirar tontura rea vermelha, inchada e dolorosa na perna Efeitos colaterais que normalmente no precisam de cuidados mdicos (avise ao seu mdico ou profissional de sade se persistirem ou forem incmodos): diarreia dor de cabea Esta lista pode no descrever todos os efeitos colaterais possveis. Para mais orientaes sobre efeitos colaterais, consulte o seu mdico. Voc pode relatar a ocorrncia de efeitos colaterais  FDA pelo telefone [phone]. Onde devo guardar meu medicamento? [facility] fora do [employer]. Conservar em [employer], entre 15 e 30 degreesC (  59 e 86 degreesF). Proteger Administrator, arts. Descartar qualquer medicamento no utilizado aps a data de validade impressa no rtulo ou embalagem. OBSERVAO: Este folheto  um resumo. Pode no cobrir todas as informaes possveis. Se tiver dvidas a respeito deste medicamento, fale com seu mdico, farmacutico ou profissional de sade.  2021 [employer]vier/[employer] Standard (2010-03-09 00:00:00)

## 2021-03-11 ENCOUNTER — Telehealth: Payer: Self-pay | Admitting: Cardiology

## 2021-03-11 ENCOUNTER — Telehealth: Payer: Self-pay | Admitting: Internal Medicine

## 2021-03-11 NOTE — Telephone Encounter (Signed)
Scheduled appt per 9/22 los - called patient and confirmed appt date and time for next wk . Patient is aware.,

## 2021-03-11 NOTE — Telephone Encounter (Signed)
Pt is calling in regards to applying for patient assistance for the medicineds [place] and Zetia. Pt states she went to the pharmacy to pick up her meds and was advised that the former paperwork had expired. Please advise pt further

## 2021-03-14 ENCOUNTER — Ambulatory Visit: Payer: Medicare HMO

## 2021-03-14 ENCOUNTER — Ambulatory Visit (INDEPENDENT_AMBULATORY_CARE_PROVIDER_SITE_OTHER): Payer: Medicare HMO | Admitting: Pharmacist Clinician (PhC)/ Clinical Pharmacy Specialist

## 2021-03-14 ENCOUNTER — Other Ambulatory Visit: Payer: Self-pay

## 2021-03-14 ENCOUNTER — Encounter: Payer: Self-pay | Admitting: Pharmacist Clinician (PhC)/ Clinical Pharmacy Specialist

## 2021-03-14 DIAGNOSIS — I10 Essential (primary) hypertension: Secondary | ICD-10-CM | POA: Diagnosis not present

## 2021-03-14 MED ORDER — CHLORTHALIDONE 25 MG PO TABS: 25.0000 mg | ORAL_TABLET | Freq: Every day | ORAL | 6 refills | Status: DC

## 2021-03-14 NOTE — Telephone Encounter (Signed)
[employer] called the pharmacy and got the situation resolved and will mail the pt a copy of the grant for the pharmacy

## 2021-03-14 NOTE — Patient Instructions (Signed)
Return for a a follow up appointment November 9 at 10:30 am  Check your blood pressure at home daily and keep record of the readings.  Take your BP meds as follows:  Stop HCTZ  Start chlorthalidone 25 mg once daily in the mornings  Continue with all other medications  Bring all of your meds, your BP cuff and your record of home blood pressures to your next appointment.  Exercise as you're able, try to walk approximately 30 minutes per day.  Keep salt intake to a minimum, especially watch canned and prepared boxed foods.  Eat more fresh fruits and vegetables and fewer canned items.  Avoid eating in fast food restaurants.    HOW TO TAKE YOUR BLOOD PRESSURE: Rest 5 minutes before taking your blood pressure.  Don't smoke or drink caffeinated beverages for at least 30 minutes before. Take your blood pressure before (not after) you eat. Sit comfortably with your back supported and both feet on the floor (don't cross your legs). Elevate your arm to heart level on a table or a desk. Use the proper sized cuff. It should fit smoothly and snugly around your bare upper arm. There should be enough room to slip a fingertip under the cuff. The bottom edge of the cuff should be 1 inch above the crease of the elbow. Ideally, take 3 measurements at one sitting and record the average.

## 2021-03-14 NOTE — Progress Notes (Signed)
Cardiology Office Note:    Date:  03/16/2021   ID:  Allison [name], DOB [birth year], MRN [id number]  PCP:  [name], MD  Cardiologist:  Donato Heinz, MD  Electrophysiologist:  None   Referring MD: [name], MD   No chief complaint on file.   History of Present Illness:    Allison [name] is a 70 y.o. female with a hx of CAD status post DES to RCA 06/16/19, stage IV lung cancer, hypertension, hyperlipidemia, DVT/PE, CVA who presents for follow-up.  She presented to ED on 06/12/2019 with chest pain, found to have NSTEMI with peak high-sensitivity troponin 9133.  TTE on 06/13/2019 showed LVEF 55 to 60%, mild LVH, low normal RV systolic function, moderate MR. Cath showed 95% proximal RCA lesion, drug-eluting stent was successfully placed.  Zio patch x13 days on 01/07/2020 showed 3 episodes of NSVT longest lasting 7 beats, 17 episodes of SVT, longest lasting 15 seconds.  At clinic visit in September 2021, she reported chest pains.  Lexiscan Myoview on 03/19/2020 showed normal perfusion, EF 70%.  She was admitted to [facility] 02/08/2021 with acute DVT/PE.  Echocardiogram 02/08/2021 showed normal biventricular function, no significant valvular disease.  She was discharged on Eliquis 02/10/2021.  Subsequently was admitted back to [facility] on 02/11/2021 with acute CVA.  This was thought to be embolic in setting of hypercoagulable state due to stage IV lung cancer.  Oncology recommended switching from Eliquis to Lovenox for anticoagulation.  Echocardiogram 02/13/2021 showed normal LV systolic function, mild to moderate MR.  Since last clinic visit, she reports has been doing okay.  She has been having episodes of chest tightness, worse with exertion.  Also feels short of breath all the time and feels very tired.  Denies any lightheadedness or syncope.  Denies any palpitations at rest, but feels like heart is racing when she exerts herself.  Past Medical History:  Diagnosis Date   Anxiety     Arthritis    Asthma    exercise induced   Depression    PMH   GERD (gastroesophageal reflux disease)    Glaucoma    History of radiation therapy 01/05/2021   IMRT right lung  11/24/2020-01/05/2021  Dr [name]   Hypertension    lung ca dx'd 11/2017   right   Malignant pleural effusion    right   PONV (postoperative nausea and vomiting)    Pre-diabetes    Raynaud's disease    Raynaud's disease     Past Surgical History:  Procedure Laterality Date   ABDOMINAL HYSTERECTOMY     partial   CHEST TUBE INSERTION Right 01/01/2018   Procedure: INSERTION PLEURAL DRAINAGE CATHETER;  Surgeon: Ivin Poot, MD;  Location: [place];  Service: Thoracic;  Laterality: Right;   COLONOSCOPY     CORONARY STENT INTERVENTION N/A 06/16/2019   Procedure: CORONARY STENT INTERVENTION;  Surgeon: Jettie Booze, MD;  Location: [place] [facility];  Service: Cardiovascular;  Laterality: N/A;   DILATION AND CURETTAGE OF UTERUS     EYE SURGERY     due to Glaucoma   LEFT HEART CATH AND CORONARY ANGIOGRAPHY N/A 06/16/2019   Procedure: LEFT HEART CATH AND CORONARY ANGIOGRAPHY;  Surgeon: Jettie Booze, MD;  Location: [place] [facility];  Service: Cardiovascular;  Laterality: N/A;   REMOVAL OF PLEURAL DRAINAGE CATHETER Right 11/07/2018   Procedure: REMOVAL OF PLEURAL DRAINAGE CATHETER;  Surgeon: Ivin Poot, MD;  Location: [place];  Service: Thoracic;  Laterality:  Right;   ROTATOR CUFF REPAIR     TUBAL LIGATION     WISDOM TOOTH EXTRACTION      Current Medications: Current Meds  Medication Sig   acetaminophen (TYLENOL) 500 MG tablet Take 1,000 mg by mouth every 6 (six) hours as needed (pain).   ALPRAZolam (XANAX) 0.25 MG tablet Take 0.25 mg by mouth 2 (two) times daily as needed for anxiety.    amLODipine (NORVASC) 5 MG tablet Take 5 mg by mouth daily.   aspirin 81 MG chewable tablet Chew 1 tablet (81 mg total) by mouth daily.   b complex vitamins capsule Take 1 capsule by mouth daily.    cetirizine (ZYRTEC) 5 MG tablet Take 5 mg by mouth daily.   chlorthalidone (HYGROTON) 25 MG tablet Take 1 tablet (25 mg total) by mouth daily.   dorzolamide-timolol (COSOPT) 22.3-6.8 MG/ML ophthalmic solution Place 1 drop into both eyes 2 (two) times daily.   doxycycline (VIBRA-TABS) 100 MG tablet Take 100 mg by mouth 2 (two) times daily.   enoxaparin (LOVENOX) 60 MG/0.6ML injection Inject 0.6 mLs (60 mg total) into the skin every 12 (twelve) hours.   esomeprazole (NEXIUM) 20 MG packet Take 20 mg by mouth daily.   ezetimibe (ZETIA) 10 MG tablet Take 10 mg by mouth daily.    folic acid (FOLVITE) 1 MG tablet Take 1 tablet (1 mg total) by mouth daily.   guaiFENesin-dextromethorphan (ROBITUSSIN DM) 100-10 MG/5ML syrup Take 10 mLs by mouth 4 (four) times daily.   HYDROcodone bit-homatropine (HYCODAN) 5-1.5 MG/5ML syrup Take 5 mLs by mouth every 4 (four) hours as needed for cough.   isosorbide mononitrate (IMDUR) 30 MG 24 hr tablet Take 1 tablet (30 mg total) by mouth daily.   latanoprost (XALATAN) 0.005 % ophthalmic solution Place 1 drop into both eyes at bedtime.    magnesium oxide (MAG-OX) 400 MG tablet Take 400 mg by mouth daily. Take 1/2 tablet daily   mometasone-formoterol (DULERA) 100-5 MCG/ACT AERO Inhale 2 puffs into the lungs 2 (two) times daily.   morphine 20 MG/5ML solution Take 0.6 mLs (2.4 mg total) by mouth every 4 (four) hours as needed (shortness of breath).   osimertinib mesylate (TAGRISSO) 80 MG tablet Take 80 mg by mouth daily.   OVER THE COUNTER MEDICATION Take 1,200 mLs by mouth daily. Viatmin d take 1224ml daily   predniSONE (DELTASONE) 10 MG tablet Take 60mg  daily for 3days, Take 50mg  daily for 7days,Take 40mg  daily for 7days,Take 30mg  daily for 7days,Take 20mg  daily for 7days,Take 10mg  daily for 7days, then stop   prochlorperazine (COMPAZINE) 10 MG tablet Take 1 tablet (10 mg total) by mouth every 6 (six) hours as needed for nausea or vomiting.   Pseudoephedrine-Acetaminophen  (CEPACOL CHILD SORE THROAT FORM PO) Take 1 lozenge by mouth. Take 1 as needed   REPATHA SURECLICK 834 MG/ML SOAJ Inject 140 mg into the skin every 14 (fourteen) days.   [DISCONTINUED] carvedilol (COREG) 6.25 MG tablet TAKE 1 TABLET BY MOUTH 2 TIMES DAILY WITH A MEAL. PLEASE SCHEDULE APPOINTMENT FOR FUTURE REFILLS. (Patient taking differently: Take 6.25 mg by mouth 2 (two) times daily with a meal.)     Allergies:   Penicillins, Vicodin [hydrocodone-acetaminophen], and Other   Social History   Socioeconomic History   Marital status: Widowed    Spouse name: Not on file   Number of children: 3   Years of education: Not on file   Highest education level: Not on file  Occupational History  Employer: [employer]  Tobacco Use   Smoking status: Never   Smokeless tobacco: Never  Vaping Use   Vaping Use: Never used  Substance and Sexual Activity   Alcohol use: Not Currently    Comment: up to 3 drinks per week   Drug use: No    Comment: CBD oil    Sexual activity: Not Currently    Comment: Hysterectomy  Other Topics Concern   Not on file  Social History Narrative   Not on file   Social Determinants of Health   Financial Resource Strain: Not on file  Food Insecurity: Not on file  Transportation Needs: Not on file  Physical Activity: Not on file  Stress: Not on file  Social Connections: Not on file     Family History: The patient's family history includes Heart disease in her brother and sister; Lung cancer in an other family member.  ROS:   Please see the history of present illness.     All other systems reviewed and are negative.  EKGs/Labs/Other Studies Reviewed:    The following studies were reviewed today:   EKG:  EKG is ordered today.  EKG  shows sinus rhythm, rate 78, no ST abnormality  Cath 06/16/19: Prox LAD lesion is 25% stenosed. Prox RCA lesion is 95% stenosed. A drug-eluting stent was successfully placed using a SYNERGY XD 2.25X28, postdilated to 2.5  mm. Post intervention, there is a 0% residual stenosis. The left ventricular systolic function is normal. LV end diastolic pressure is normal. The left ventricular ejection fraction is 55-65% by visual estimate. There is no aortic valve stenosis.   Continue dual antiplatelet therapy.  Would check with Pharmacy about interactions of Plavix with Tagrisso.  If there is an interaction, would switch to Brilinta.        TTE 06/13/19:  1. Left ventricular ejection fraction, by visual estimation, is 55 to  60%. The left ventricle has normal function. There is mildly increased  left ventricular hypertrophy.   2. The left ventricle has no regional wall motion abnormalities.   3. Global right ventricle has low normal systolic function.The right  ventricular size is normal. No increase in right ventricular wall  thickness.   4. Left atrial size was normal.   5. Right atrial size was normal.   6. Mild mitral annular calcification.   7. The mitral valve is degenerative. Moderate mitral valve regurgitation.   8. The tricuspid valve is grossly normal.   9. The aortic valve is tricuspid. Aortic valve regurgitation is not  visualized. No evidence of aortic valve sclerosis or stenosis.  10. The pulmonic valve was grossly normal. Pulmonic valve regurgitation is  not visualized.  11. Normal pulmonary artery systolic pressure.  12. The inferior vena cava is normal in size with greater than 50%  respiratory variability, suggesting right atrial pressure of 3 mmHg.   Carotid duplex 08/01/19: Right Carotid: There is no evidence of stenosis in the right ICA. The                 extracranial vessels were near-normal with only minimal  wall                 thickening or plaque.   Left Carotid: Velocities in the left ICA are consistent with a 1-39%  stenosis.                Non-hemodynamically significant plaque <50% noted in the  CCA.   Vertebrals:  Bilateral vertebral  arteries demonstrate antegrade  flow.  Subclavians: Normal flow hemodynamics were seen in bilateral subclavian               arteries.   Recent Labs: 02/07/2021: B Natriuretic Peptide 35.4 02/08/2021: TSH 0.382 02/16/2021: Magnesium 2.1 02/23/2021: ALT 87; Hemoglobin 12.1; Platelet Count 248 03/04/2021: BUN 21; Creatinine, Ser 1.02; Potassium 4.6; Sodium 140  Recent Lipid Panel    Component Value Date/Time   CHOL 175 02/12/2021 0559   CHOL 153 11/01/2020 0839   TRIG 110 02/12/2021 0559   HDL 62 02/12/2021 0559   HDL 64 11/01/2020 0839   CHOLHDL 2.8 02/12/2021 0559   VLDL 22 02/12/2021 0559   LDLCALC 91 02/12/2021 0559   LDLCALC 74 11/01/2020 0839    Physical Exam:    VS:  BP (!) 180/84   Pulse 78   Ht 5\' 4"  (1.626 m)   Wt 122 lb 7.2 oz (55.5 kg)   LMP  (LMP Unknown)   SpO2 92%   BMI 21.02 kg/m     Wt Readings from Last 3 Encounters:  03/16/21 122 lb 7.2 oz (55.5 kg)  03/14/21 123 lb 12.8 oz (56.2 kg)  03/10/21 125 lb 1 oz (56.7 kg)     GEN:  Well nourished, well developed in no acute distress HEENT: Normal NECK: No JVD CARDIAC: RRR, no murmurs, rubs, gallops RESPIRATORY:  Clear to auscultation without rales, wheezing or rhonchi  ABDOMEN: Soft, non-tender, non-distended MUSCULOSKELETAL:  No edema SKIN: Warm and dry NEUROLOGIC:  Alert and oriented x 3 PSYCHIATRIC:  Normal affect   ASSESSMENT:    1. CAD in native artery   2. Chest pain of uncertain etiology   3. Acute pulmonary embolism without acute cor pulmonale, unspecified pulmonary embolism type ([place])   4. Acute CVA (cerebrovascular accident) ([place])   5. Essential hypertension   6. Hyperlipidemia, unspecified hyperlipidemia type     PLAN:    CAD: Status post drug-eluting stent to RCA on 06/16/2019.  At clinic visit in September 2021, she reported chest pains.  Lexiscan Myoview on 03/19/2020 showed normal perfusion, EF 70%.  Currently reporting episodes of chest tightness that is worse with exertion -Continue aspirin 81 mg daily.  Plavix  was discontinued in November 2021 due to rectal bleeding  -Continue Coreg, will increase to 12.5 mg twice daily -Has had issues starting statins due to transaminitis and myalgias.  Referred to lipid clinic and started on Praluent in September 2021.  LDL 80 06/15/2020, Praluent dose increased to 150 mg every 14 days -Recommend Lexiscan Myoview to evaluate for ischemia  Acute DVT/PE: admitted to [facility] 02/08/2021 with acute DVT/PE.  She was started on Eliquis, but switched to therapeutic Lovenox after her CVA 02/10/2021 per oncology  Acute CVA: admitted back to [facility] on 02/11/2021 with acute CVA.  This was thought to be embolic in setting of hypercoagulable state due to stage IV lung cancer.  Oncology recommended switching from Eliquis to Lovenox for anticoagulation.  Palpitations: Description concerning for arrhythmia.  Zio patch x13 days on 01/07/2020 showed 3 episodes of NSVT longest lasting 7 beats, 17 episodes of SVT, longest lasting 15 seconds.  Continue carvedilol.  Carotid bruit: 1 to 39% left carotid artery stenosis, no stenosis in right coronary artery on carotid duplex 08/01/2019.  Hyperlipidemia: LDL 148 on 06/13/2019, improved to LDL 44 08/15/19 while on Lipitor 80 mg daily, but was discontinued due to transaminitis.  She also reported having myalgias with atorvastatin.  Referred to lipid clinic, started on rosuvastatin  5 mg every other day but developed myalgias.  Was reduced to twice weekly but continued to have myalgias so was discontinued.  Started on Praluent September 2021.  LDL 80 on 06/15/2020, Praluent dose increased to 150 mg every 14 days.  LDL 91 on 02/12/21  Hypertension: On carvedilol 6.25 mg twice daily, amlodipine 5 mg daily, Imdur 30 mg daily and and chlorthalidone 25 mg daily.  Was seen in pharmacy hypertension clinic on 9/26 in [place] switched to chlorthalidone.  BP remains significantly elevated, will increase carvedilol to 12.5 mg twice daily.  Asked to check BP daily and call with  results in 2 weeks  Stage IV lung cancer: Followed by oncology.  Has been on Tagrisso.  Recent CT showed progression of disease, planning on having port placed and starting chemotherapy  RTC in 3 months  Shared Decision Making/Informed Consent The risks [chest pain, shortness of breath, cardiac arrhythmias, dizziness, blood pressure fluctuations, myocardial infarction, stroke/transient ischemic attack, nausea, vomiting, allergic reaction, radiation exposure, metallic taste sensation and life-threatening complications (estimated to be 1 in 10,000)], benefits (risk stratification, diagnosing coronary artery disease, treatment guidance) and alternatives of a nuclear stress test were discussed in detail with Allison [name] and she agrees to proceed.   Medication Adjustments/Labs and Tests Ordered: Current medicines are reviewed at length with the patient today.  Concerns regarding medicines are outlined above.  Orders Placed This Encounter  Procedures   MYOCARDIAL PERFUSION IMAGING   EKG 12-Lead    Meds ordered this encounter  Medications   carvedilol (COREG) 12.5 MG tablet    Sig: Take 1 tablet (12.5 mg total) by mouth 2 (two) times daily with a meal.    Dispense:  180 tablet    Refill:  3    Dose increase     Patient Instructions  Medication Instructions:  INCREASE carvedilol (Coreg) to 12.5 mg two times daily  *If you need a refill on your cardiac medications before your next appointment, please call your pharmacy*  Testing/Procedures: Your physician has requested that you have a lexiscan myoview ([place]). For further information please visit [email or web address]. Please follow instruction sheet, as given.   How to prepare for your Myocardial Perfusion Test: Do not eat or drink 3 hours prior to your test, except you may have water. Do not consume products containing caffeine (regular or decaffeinated) 12 hours prior to your test. (ex: coffee, chocolate, sodas,  tea). Do bring a list of your current medications with you.  If not listed below, you may take your medications as normal. Do wear comfortable clothes (no dresses or overalls) and walking shoes, tennis shoes preferred (No heels or open toe shoes are allowed). Do NOT wear cologne, perfume, aftershave, or lotions (deodorant is allowed). The test will take approximately 3 to 4 hours to complete If these instructions are not followed, your test will have to be rescheduled.  Follow-Up: At [facility], you and your health needs are our priority.  As part of our continuing mission to provide you with exceptional heart care, we have created designated Provider Care Teams.  These Care Teams include your primary Cardiologist (physician) and Advanced Practice Providers (APPs -  Physician Assistants and Nurse Practitioners) who all work together to provide you with the care you need, when you need it.  We recommend signing up for the patient portal called "MyChart".  Sign up information is provided on this After Visit Summary.  MyChart is used to connect with patients for Virtual Visits (  Telemedicine).  Patients are able to view lab/test results, encounter notes, upcoming appointments, etc.  Non-urgent messages can be sent to your provider as well.   To learn more about what you can do with MyChart, go to [email or web address].    Your next appointment:   Tuesday, January 3rd at 2:40 pm with Dr. Gardiner Rhyme   Signed, Donato Heinz, MD  03/16/2021 8:59 AM    [place]

## 2021-03-14 NOTE — Progress Notes (Signed)
03/14/2021 Allison [name] 1950-10-04 [id number]   HPI:  Allison [name] is a 70 y.o. female patient of Dr Gardiner Rhyme, with a PMH below who presents today for hypertension clinic evaluation.  She sent a message to the office earlier this month showing home BP readings ranging from 141-182/68-89 for the first 10+ days of the month.  She was recommended to re-start hctz 25 mg and check BMET after 1 week.  Pressure came down some for 2-3 days, then jumped back up, although only to 735'H systolic.  She was asked to schedule appointment with [facility].  Today patient is in office with one daughter and another daughter is on speaker phone.  Patient continues to have chest tightness and SOB/cough that comes and goes.  She notes that her BP has been elevated since hospitalization in August.  She is currently being treated for lung cancer with Tagrisso, which does not appear to cause BP increases, and will start chemo again this Thursday.  Her chemo regimen includes IV dexamethasone, which can cause BP increases.  She is also finishing up a tapered prednisone dose, now on 20 mg daily and will decrease to 10 mg daily next week, then stop.       Past Medical History: hyperlipidemia Statins caused transaminitis and myalgias, now on Praluent, LDL at 91 (8/22)  VTE 02/07/21 - PE/DVT secondary to hypercoagulable state  ASCVD NSTEMI 05/2019, DES to RCA, now on ASA, carvedilol   CVA 02/11/21 - only 4 days after PE/DVT, now on enoxaparin bid  Lung cancer Stage IV, on Tagrisso     Blood Pressure Goal:  130/80  Current Medications: hctz 25 mg qd (am), amlodipine 5 mg qd (5 pm); carvedilol 6.25 mg bid  Family Hx:  2 siblings died from heart disease, 1 sister living with heart disease; parents without heart issues; 3 daughters, 2 with hypertension,  Social Hx: no tobacco,  no alcohol, decaf coffee  Diet: mix of home and out; takeout is not fast food, but healthy choices; garden fresh vegetables, eats mostly salmon,  some chicken; eats more veggies than meats  Exercise: not able at this time  Home BP readings:  home cuff [employer] brand about 73+ years old (after MI), read within 10 points 30 home readings this month average 156/83 with range of 133-182/67-93  Intolerances: amlodipine 10 - edema  Labs:  03/04/21:  Na 140, K 4.6, Glu 129, BUN 21, SCr 1.02 GFR 59   Wt Readings from Last 3 Encounters:  03/14/21 123 lb 12.8 oz (56.2 kg)  03/10/21 125 lb 1 oz (56.7 kg)  03/04/21 125 lb (56.7 kg)   BP Readings from Last 3 Encounters:  03/14/21 (!) 158/82  03/10/21 (!) 144/80  03/08/21 (!) 155/81   Pulse Readings from Last 3 Encounters:  03/14/21 90  03/10/21 94  03/08/21 86    Current Outpatient Medications  Medication Sig Dispense Refill   acetaminophen (TYLENOL) 500 MG tablet Take 1,000 mg by mouth every 6 (six) hours as needed (pain).     ALPRAZolam (XANAX) 0.25 MG tablet Take 0.25 mg by mouth 2 (two) times daily as needed for anxiety.      amLODipine (NORVASC) 5 MG tablet Take 5 mg by mouth daily.     aspirin 81 MG chewable tablet Chew 1 tablet (81 mg total) by mouth daily.     b complex vitamins capsule Take 1 capsule by mouth daily.     carvedilol (COREG) 6.25 MG tablet TAKE  1 TABLET BY MOUTH 2 TIMES DAILY WITH A MEAL. PLEASE SCHEDULE APPOINTMENT FOR FUTURE REFILLS. (Patient taking differently: Take 6.25 mg by mouth 2 (two) times daily with a meal.) 180 tablet 1   cetirizine (ZYRTEC) 5 MG tablet Take 5 mg by mouth daily.     chlorthalidone (HYGROTON) 25 MG tablet Take 1 tablet (25 mg total) by mouth daily. 30 tablet 6   dorzolamide-timolol (COSOPT) 22.3-6.8 MG/ML ophthalmic solution Place 1 drop into both eyes 2 (two) times daily.     doxycycline (VIBRA-TABS) 100 MG tablet Take 100 mg by mouth 2 (two) times daily.     enoxaparin (LOVENOX) 60 MG/0.6ML injection Inject 0.6 mLs (60 mg total) into the skin every 12 (twelve) hours. 60 mL 1   esomeprazole (NEXIUM) 20 MG packet Take 20 mg by mouth  daily.     ezetimibe (ZETIA) 10 MG tablet Take 10 mg by mouth daily.      folic acid (FOLVITE) 1 MG tablet Take 1 tablet (1 mg total) by mouth daily. 30 tablet 4   guaiFENesin-dextromethorphan (ROBITUSSIN DM) 100-10 MG/5ML syrup Take 10 mLs by mouth 4 (four) times daily. 118 mL 0   HYDROcodone bit-homatropine (HYCODAN) 5-1.5 MG/5ML syrup Take 5 mLs by mouth every 4 (four) hours as needed for cough. 120 mL 0   isosorbide mononitrate (IMDUR) 30 MG 24 hr tablet Take 1 tablet (30 mg total) by mouth daily. 15 tablet 0   latanoprost (XALATAN) 0.005 % ophthalmic solution Place 1 drop into both eyes at bedtime.      magnesium oxide (MAG-OX) 400 MG tablet Take 400 mg by mouth daily. Take 1/2 tablet daily     mometasone-formoterol (DULERA) 100-5 MCG/ACT AERO Inhale 2 puffs into the lungs 2 (two) times daily. 1 each 0   morphine 20 MG/5ML solution Take 0.6 mLs (2.4 mg total) by mouth every 4 (four) hours as needed (shortness of breath). 30 mL 0   osimertinib mesylate (TAGRISSO) 80 MG tablet Take 80 mg by mouth daily.     OVER THE COUNTER MEDICATION Take 1,200 mLs by mouth daily. Viatmin d take 123ml daily     predniSONE (DELTASONE) 10 MG tablet Take 60mg  daily for 3days, Take 50mg  daily for 7days,Take 40mg  daily for 7days,Take 30mg  daily for 7days,Take 20mg  daily for 7days,Take 10mg  daily for 7days, then stop 120 tablet 0   prochlorperazine (COMPAZINE) 10 MG tablet Take 1 tablet (10 mg total) by mouth every 6 (six) hours as needed for nausea or vomiting. 30 tablet 0   Pseudoephedrine-Acetaminophen (CEPACOL CHILD SORE THROAT FORM PO) Take 1 lozenge by mouth. Take 1 as needed     REPATHA SURECLICK 175 MG/ML SOAJ Inject 140 mg into the skin every 14 (fourteen) days.     albuterol (VENTOLIN HFA) 108 (90 Base) MCG/ACT inhaler Inhale 2 puffs into the lungs every 6 (six) hours as needed for wheezing or shortness of breath. (Patient not taking: No sig reported) 1 each 2   benzonatate (TESSALON) 200 MG capsule Take 1  capsule (200 mg total) by mouth 3 (three) times daily as needed for cough. (Patient not taking: No sig reported) 20 capsule 0   lidocaine-prilocaine (EMLA) cream Apply 1 application topically as needed. (Patient not taking: Reported on 03/14/2021) 30 g 0   sucralfate (CARAFATE) 1 g tablet Take 1 tablet (1 g total) by mouth 4 (four) times daily. with meals and at bedtime. Crush and dissolve in 10 mL of warm water prior to swallowing (Patient not  taking: No sig reported) 120 tablet 0   No current facility-administered medications for this visit.    Allergies  Allergen Reactions   Penicillins Other (See Comments)    SYNCOPE PATIENT HAS HAD A PCN REACTION WITH IMMEDIATE RASH, FACIAL/TONGUE/THROAT SWELLING, SOB, OR LIGHTHEADEDNESS WITH HYPOTENSION:  #  #  YES  #  # Has patient had a PCN reaction causing severe rash involving mucus membranes or skin necrosis: No Has patient had a PCN reaction that required hospitalization: No Has patient had a PCN reaction occurring within the last 10 years: No    Vicodin [Hydrocodone-Acetaminophen] Other (See Comments)    Sped up heart and breathing   Other Other (See Comments)    Glaucoma eye drop    Past Medical History:  Diagnosis Date   Anxiety    Arthritis    Asthma    exercise induced   Depression    PMH   GERD (gastroesophageal reflux disease)    Glaucoma    History of radiation therapy 01/05/2021   IMRT right lung  11/24/2020-01/05/2021  Dr [name]   Hypertension    lung ca dx'd 11/2017   right   Malignant pleural effusion    right   PONV (postoperative nausea and vomiting)    Pre-diabetes    Raynaud's disease    Raynaud's disease     Blood pressure (!) 158/82, pulse 90, resp. rate 17, height 5\' 4"  (1.626 m), weight 123 lb 12.8 oz (56.2 kg), SpO2 97 %.  Hypertension Patient with essential hypertension, currently not at goal.  Patient and daughter believe some of this is due to stress/anxiety with her ongoing cancer treatment.  Will  have her switch hydrochlorothiazide to chlorthalidone 25 mg once daily in the mornings.  She should also only check home BP readings twice daily.  We will see her back in 6 weeks for follow up.  She can send in readings thru MyChart before then should she have any questions or concerns.  No lab work ordered, as she will be getting regular labs thru the cancer center.     Tommy Medal PharmD CPP [facility] [address] [place], [place] [address] [phone]

## 2021-03-14 NOTE — Assessment & Plan Note (Signed)
Patient with essential hypertension, currently not at goal.  Patient and daughter believe some of this is due to stress/anxiety with her ongoing cancer treatment.  Will have her switch hydrochlorothiazide to chlorthalidone 25 mg once daily in the mornings.  She should also only check home BP readings twice daily.  We will see her back in 6 weeks for follow up.  She can send in readings thru MyChart before then should she have any questions or concerns.  No lab work ordered, as she will be getting regular labs thru the cancer center.

## 2021-03-15 ENCOUNTER — Other Ambulatory Visit: Payer: Self-pay

## 2021-03-15 ENCOUNTER — Encounter (INDEPENDENT_AMBULATORY_CARE_PROVIDER_SITE_OTHER): Payer: Self-pay

## 2021-03-15 ENCOUNTER — Encounter: Payer: Self-pay | Admitting: Internal Medicine

## 2021-03-15 ENCOUNTER — Encounter: Payer: Self-pay | Admitting: Pharmacist Clinician (PhC)/ Clinical Pharmacy Specialist

## 2021-03-15 DIAGNOSIS — C3491 Malignant neoplasm of unspecified part of right bronchus or lung: Secondary | ICD-10-CM

## 2021-03-15 MED ORDER — REPATHA SURECLICK 140 MG/ML ~~LOC~~ SOAJ: 140.0000 mg | SUBCUTANEOUS | 3 refills | Status: DC

## 2021-03-16 ENCOUNTER — Ambulatory Visit (INDEPENDENT_AMBULATORY_CARE_PROVIDER_SITE_OTHER): Payer: Medicare HMO | Admitting: Cardiology

## 2021-03-16 ENCOUNTER — Encounter: Payer: Self-pay | Admitting: Physician Assistant

## 2021-03-16 ENCOUNTER — Other Ambulatory Visit: Payer: Self-pay

## 2021-03-16 ENCOUNTER — Encounter: Payer: Self-pay | Admitting: Cardiology

## 2021-03-16 ENCOUNTER — Inpatient Hospital Stay: Payer: Medicare HMO

## 2021-03-16 VITALS — BP 180/84 | HR 78 | Ht 64.0 in | Wt 122.5 lb

## 2021-03-16 DIAGNOSIS — E785 Hyperlipidemia, unspecified: Secondary | ICD-10-CM | POA: Diagnosis not present

## 2021-03-16 DIAGNOSIS — I2699 Other pulmonary embolism without acute cor pulmonale: Secondary | ICD-10-CM | POA: Diagnosis not present

## 2021-03-16 DIAGNOSIS — I10 Essential (primary) hypertension: Secondary | ICD-10-CM

## 2021-03-16 DIAGNOSIS — I639 Cerebral infarction, unspecified: Secondary | ICD-10-CM

## 2021-03-16 DIAGNOSIS — R079 Chest pain, unspecified: Secondary | ICD-10-CM

## 2021-03-16 DIAGNOSIS — I2510 Atherosclerotic heart disease of native coronary artery without angina pectoris: Secondary | ICD-10-CM | POA: Diagnosis not present

## 2021-03-16 MED ORDER — CARVEDILOL 12.5 MG PO TABS: 12.5000 mg | ORAL_TABLET | Freq: Two times a day (BID) | ORAL | 3 refills | Status: DC

## 2021-03-16 MED FILL — Dexamethasone Sodium Phosphate Inj 100 MG/10ML: INTRAMUSCULAR | Qty: 1 | Status: AC

## 2021-03-16 MED FILL — Fosaprepitant Dimeglumine For IV Infusion 150 MG (Base Eq): INTRAVENOUS | Qty: 5 | Status: AC

## 2021-03-16 NOTE — Patient Instructions (Signed)
Medication Instructions:  INCREASE carvedilol (Coreg) to 12.5 mg two times daily  *If you need a refill on your cardiac medications before your next appointment, please call your pharmacy*  Testing/Procedures: Your physician has requested that you have a lexiscan myoview (AT [place]). For further information please visit [email or web address]. Please follow instruction sheet, as given.   How to prepare for your Myocardial Perfusion Test: Do not eat or drink 3 hours prior to your test, except you may have water. Do not consume products containing caffeine (regular or decaffeinated) 12 hours prior to your test. (ex: coffee, chocolate, sodas, tea). Do bring a list of your current medications with you.  If not listed below, you may take your medications as normal. Do wear comfortable clothes (no dresses or overalls) and walking shoes, tennis shoes preferred (No heels or open toe shoes are allowed). Do NOT wear cologne, perfume, aftershave, or lotions (deodorant is allowed). The test will take approximately 3 to 4 hours to complete If these instructions are not followed, your test will have to be rescheduled.  Follow-Up: At [facility], you and your health needs are our priority.  As part of our continuing mission to provide you with exceptional heart care, we have created designated Provider Care Teams.  These Care Teams include your primary Cardiologist (physician) and Advanced Practice Providers (APPs -  Physician Assistants and Nurse Practitioners) who all work together to provide you with the care you need, when you need it.  We recommend signing up for the patient portal called "MyChart".  Sign up information is provided on this After Visit Summary.  MyChart is used to connect with patients for Virtual Visits (Telemedicine).  Patients are able to view lab/test results, encounter notes, upcoming appointments, etc.  Non-urgent messages can be sent to your provider as well.   To  learn more about what you can do with MyChart, go to [email or web address].    Your next appointment:   Tuesday, January 3rd at 2:40 pm with Dr. Gardiner Rhyme

## 2021-03-16 NOTE — Progress Notes (Signed)
Pharmacist Chemotherapy Monitoring - Initial Assessment    Anticipated start date: 03/17/21   The following has been reviewed per standard work regarding the patient's treatment regimen: The patient's diagnosis, treatment plan and drug doses, and organ/hematologic function Lab orders and baseline tests specific to treatment regimen  The treatment plan start date, drug sequencing, and pre-medications Prior authorization status  Patient's documented medication list, including drug-drug interaction screen and prescriptions for anti-emetics and supportive care specific to the treatment regimen The drug concentrations, fluid compatibility, administration routes, and timing of the medications to be used The patient's access for treatment and lifetime cumulative dose history, if applicable  The patient's medication allergies and previous infusion related reactions, if applicable   Changes made to treatment plan:  N/A  Follow up needed:  N/A    Kennith Center, Pharm.D., CPP 03/16/2021@1 :00 PM

## 2021-03-16 NOTE — Progress Notes (Signed)
[facility] OFFICE PROGRESS NOTE  [name], [place] [address]  DIAGNOSIS: Stage IV (T2 a,N2, M1a) non-small cell lung cancer, adenocarcinoma diagnosed in July 2019 and presented with right upper lobe lung mass in addition to mediastinal lymphadenopathy as well as bilateral pulmonary nodules and malignant right pleural effusion.   Biomarker Findings Microsatellite status - Cannot Be Determined Tumor Mutational Burden - Cannot Be Determined Genomic Findings For a complete list of the genes assayed, please refer to the Appendix. EGFR exon 19 deletion ([id number]) TP53 Y220C 7 Disease relevant genes with no reportable alterations: KRAS, ALK, BRAF, MET, RET, ERBB2, ROS1   PRIOR THERAPY: 1) Status post right Pleurx catheter placement by Dr. Prescott Gum for drainage of malignant right pleural effusion. 2) palliative radiotherapy to the enlarging right upper lobe lung mass and mediastinum under the care of Dr. Sondra Come expected to be completed on January 05, 2021. 3) Tagrisso 80 mg p.o. daily.  First dose was given on 01/29/2018.  Status post 37 months of treatment.  CURRENT THERAPY: Systemic chemotherapy with carboplatin for AUC of 5 and Alimta 500 Mg/M2 every 3 weeks.  First dose 03/17/2021.  The patient will also continue her current treatment with Tagrisso 80 mg p.o. daily.  She is status post 1 cycle.    INTERVAL HISTORY: Allison [name] 70 y.o. female returns to the clinic today for a follow-up visit accompanied by a friend.  The patient was recently found to have evidence of disease progression, therefore, systemic chemotherapy was added to her treatment plan.  She continues to take Tagrisso due to ability to cross the blood-brain barrier.  The patient is not a good candidate for Avastin due to her recent CVA.  The patient follows with cardiology for her hypertension.  She mentions that she is pleased with the improvement in her  hypertension.  She is scheduled for a nuclear stress test by cardiology next week.  Also, in the interval since her last appointment she received a Port-A-Cath by interventional radiology.  She had some bleeding the night after the procedure.  She follows up with interventional radiology yesterday and they cauterized the area of bleeding.  She denies any bleeding at this time.  The patient underwent her first cycle of systemic chemotherapy with carboplatin and Alimta last week.  She tolerated well except for some constipation.  She currently is not taking any stool softeners.  She has a bowel movement every day but states that it is hard.  She continues to have some stable ongoing concerns.  She states ever since she received radiation that she has been having some difficulties with chest tightness upon exertion. She takes two 500 mg Tylenol's every 6 hours.  Her pain improves with rest.   At its worst, her pain can get to an 8 out of 10.  She also has associated rapid breathing.  She recently saw her pulmonologist as well as her primary care provider.  She is prescribed inhalers.    She also continues to have a cough.  She received antibiotics which she completed by the palliative care providers.  She is scheduled to see them again later this month.   She takes Robitussin which works for her cough and helps her get phlegm up.  She uses a hydrocodone at night to help her sleep and to help with her cough which helps somewhat but does not completely resolve her symptoms.  She denies any hemoptysis.  She denies  any nausea, vomiting, or diarrhea.  She denies any headache or visual changes except for her visual changes associated with cataracts.   She continues to struggle with weight loss and lost 8 to 10 pounds since her last appointment.  She is here today for evaluation and repeat blood work and for a 1 week follow-up visit to manage any adverse side effects of treatment.    MEDICAL HISTORY: Past Medical  History:  Diagnosis Date   Anxiety    Arthritis    Asthma    exercise induced   Depression    PMH   GERD (gastroesophageal reflux disease)    Glaucoma    History of radiation therapy 01/05/2021   IMRT right lung  11/24/2020-01/05/2021  Dr [name]   Hypertension    lung ca dx'd 11/2017   right   Malignant pleural effusion    right   PONV (postoperative nausea and vomiting)    Pre-diabetes    Raynaud's disease    Raynaud's disease     ALLERGIES:  is allergic to penicillins, vicodin [hydrocodone-acetaminophen], and other.  MEDICATIONS:  Current Outpatient Medications  Medication Sig Dispense Refill   dronabinol (MARINOL) 2.5 MG capsule Take 1 capsule (2.5 mg total) by mouth 2 (two) times daily before a meal. 60 capsule 0   acetaminophen (TYLENOL) 500 MG tablet Take 1,000 mg by mouth every 6 (six) hours as needed (pain).     ALPRAZolam (XANAX) 0.25 MG tablet Take 0.25 mg by mouth 2 (two) times daily as needed for anxiety.      amLODipine (NORVASC) 5 MG tablet Take 5 mg by mouth daily.     aspirin 81 MG chewable tablet Chew 1 tablet (81 mg total) by mouth daily.     b complex vitamins capsule Take 1 capsule by mouth daily.     carvedilol (COREG) 12.5 MG tablet Take 1 tablet (12.5 mg total) by mouth 2 (two) times daily with a meal. 180 tablet 3   cetirizine (ZYRTEC) 5 MG tablet Take 5 mg by mouth daily.     chlorthalidone (HYGROTON) 25 MG tablet Take 1 tablet (25 mg total) by mouth daily. 30 tablet 6   dorzolamide-timolol (COSOPT) 22.3-6.8 MG/ML ophthalmic solution Place 1 drop into both eyes 2 (two) times daily.     enoxaparin (LOVENOX) 60 MG/0.6ML injection Inject 0.6 mLs (60 mg total) into the skin every 12 (twelve) hours. 60 mL 1   esomeprazole (NEXIUM) 20 MG packet Take 20 mg by mouth daily.     ezetimibe (ZETIA) 10 MG tablet Take 10 mg by mouth daily.      folic acid (FOLVITE) 1 MG tablet Take 1 tablet (1 mg total) by mouth daily. 30 tablet 4   guaiFENesin-dextromethorphan  (ROBITUSSIN DM) 100-10 MG/5ML syrup Take 10 mLs by mouth 4 (four) times daily. 118 mL 0   HYDROcodone bit-homatropine (HYCODAN) 5-1.5 MG/5ML syrup Take 5 mLs by mouth every 4 (four) hours as needed for cough. 120 mL 0   isosorbide mononitrate (IMDUR) 30 MG 24 hr tablet Take 1 tablet (30 mg total) by mouth daily. 15 tablet 0   latanoprost (XALATAN) 0.005 % ophthalmic solution Place 1 drop into both eyes at bedtime.      lidocaine-prilocaine (EMLA) cream Apply 1 application topically as needed. 30 g 0   magnesium oxide (MAG-OX) 400 MG tablet Take 400 mg by mouth daily. Take 1/2 tablet daily     morphine 20 MG/5ML solution Take 0.6 mLs (2.4 mg total) by  mouth every 4 (four) hours as needed (shortness of breath). 30 mL 0   osimertinib mesylate (TAGRISSO) 80 MG tablet Take 80 mg by mouth daily.     OVER THE COUNTER MEDICATION Take 1,200 mLs by mouth daily. Viatmin d take 1242m daily     predniSONE (DELTASONE) 10 MG tablet Take 630mdaily for 3days, Take 5088maily for 7days,Take 67m20mily for 7days,Take 30mg33mly for 7days,Take 20mg 80my for 7days,Take 10mg d76m for 7days, then stop 120 tablet 0   prochlorperazine (COMPAZINE) 10 MG tablet Take 1 tablet (10 mg total) by mouth every 6 (six) hours as needed for nausea or vomiting. (Patient not taking: Reported on 03/18/2021) 30 tablet 0   Pseudoephedrine-Acetaminophen (CEPACOL CHILD SORE THROAT FORM PO) Take 1 lozenge by mouth. Take 1 as needed     REPATHA SURECLICK 140 MG/553SOAJ Inject 140 mg into the skin every 14 (fourteen) days. 6 mL 3   sucralfate (CARAFATE) 1 g tablet Take 1 tablet (1 g total) by mouth 4 (four) times daily. with meals and at bedtime. Crush and dissolve in 10 mL of warm water prior to swallowing 120 tablet 0   SYMBICORT 80-4.5 MCG/ACT inhaler Inhale 2 puffs into the lungs 2 (two) times daily.     No current facility-administered medications for this visit.   Facility-Administered Medications Ordered in Other Visits  Medication  Dose Route Frequency Provider Last Rate Last Admin   prochlorperazine (COMPAZINE) 10 MG tablet             SURGICAL HISTORY:  Past Surgical History:  Procedure Laterality Date   ABDOMINAL HYSTERECTOMY     partial   CHEST TUBE INSERTION Right 01/01/2018   Procedure: INSERTION PLEURAL DRAINAGE CATHETER;  Surgeon: Van TriIvin PootLocation: [facility]; [place]: Thoracic;  Laterality: Right;   COLONOSCOPY     CORONARY STENT INTERVENTION N/A 06/16/2019   Procedure: CORONARY STENT INTERVENTION;  Surgeon: VaranasJettie BoozeLocation: [facility];  Service: Cardiovascular;  Laterality: N/A;   DILATION AND CURETTAGE OF UTERUS     EYE SURGERY     due to Glaucoma   IR IMAGING GUIDED PORT INSERTION  03/22/2021   LEFT HEART CATH AND CORONARY ANGIOGRAPHY N/A 06/16/2019   Procedure: LEFT HEART CATH AND CORONARY ANGIOGRAPHY;  Surgeon: VaranasJettie BoozeLocation: [facility];  Service: Cardiovascular;  Laterality: N/A;   REMOVAL OF PLEURAL DRAINAGE CATHETER Right 11/07/2018   Procedure: REMOVAL OF PLEURAL DRAINAGE CATHETER;  Surgeon: Van TriIvin PootLocation: [facility];  Laterality: Right;   ROTATOR CUFF REPAIR     TUBAL LIGATION     WISDOM TOOTH EXTRACTION      REVIEW OF SYSTEMS:   Review of Systems  Constitutional: Positive for fatigue, appetite change, weight loss.  Negative for chills and fever.  HENT:   Negative for mouth sores, nosebleeds, sore throat and trouble swallowing.   Eyes: Negative for eye problems and icterus.  Respiratory: Positive for cough and dyspnea.  Negative for hemoptysis and wheezing.   Cardiovascular: Positive for chest tightness with exertion.  Negative for  leg swelling.  Gastrointestinal: Positive for constipation.  Negative for abdominal pain, diarrhea, nausea and vomiting.  Genitourinary: Negative for bladder incontinence, difficulty urinating, dysuria, frequency and hematuria.   Musculoskeletal: Negative for back  pain, gait problem, neck pain and neck stiffness.  Skin: Negative for itching and rash.  Neurological: Negative for dizziness, extremity weakness,  gait problem, headaches, light-headedness and seizures.  Hematological: Negative for adenopathy. Does not bruise/bleed easily.  Psychiatric/Behavioral: Negative for confusion, depression and sleep disturbance. The patient is not nervous/anxious.     PHYSICAL EXAMINATION:  Blood pressure (!) 146/75, pulse 85, temperature (!) 97.5 F (36.4 C), temperature source Tympanic, resp. rate 19, height '5\' 4"'  (1.626 m), weight 120 lb 3.2 oz (54.5 kg), SpO2 94 %.  ECOG PERFORMANCE STATUS: 1  Physical Exam  Constitutional: Oriented to person, place, and time and thin appearing female, and in no distress.  HENT:  Head: Normocephalic and atraumatic.  Mouth/Throat: Oropharynx is clear and moist. No oropharyngeal exudate.  Eyes: Conjunctivae are normal. Right eye exhibits no discharge. Left eye exhibits no discharge. No scleral icterus.  Neck: Normal range of motion. Neck supple.  Cardiovascular: Normal rate, regular rhythm, normal heart sounds and intact distal pulses.   Pulmonary/Chest: Effort normal.  Mild rhonchi bilaterally.  No respiratory distress. No rales.  Abdominal: Soft. Bowel sounds are normal. Exhibits no distension and no mass. There is no tenderness.  Musculoskeletal: Normal range of motion. Exhibits no edema.  Lymphadenopathy:    No cervical adenopathy.  Neurological: Alert and oriented to person, place, and time. Exhibits muscle wasting. Gait normal. Coordination normal.  Skin: Skin is warm and dry. No rash noted. Not diaphoretic. No erythema. No pallor.  Psychiatric: Mood, memory and judgment normal.  Vitals reviewed.  LABORATORY DATA: Lab Results  Component Value Date   WBC 1.3 (L) 03/24/2021   HGB 11.4 (L) 03/24/2021   HCT 35.7 (L) 03/24/2021   MCV 83.6 03/24/2021   PLT 94 (L) 03/24/2021      Chemistry      Component Value  Date/Time   NA 138 03/24/2021 0956   NA 140 03/04/2021 1516   K 4.4 03/24/2021 0956   CL 97 (L) 03/24/2021 0956   CO2 31 03/24/2021 0956   BUN 26 (H) 03/24/2021 0956   BUN 21 03/04/2021 1516   CREATININE 1.08 (H) 03/24/2021 0956      Component Value Date/Time   CALCIUM 10.9 (H) 03/24/2021 0956   ALKPHOS 49 03/24/2021 0956   AST 28 03/24/2021 0956   ALT 50 (H) 03/24/2021 0956   BILITOT 0.4 03/24/2021 0956       RADIOGRAPHIC STUDIES:  CT Chest W Contrast  Result Date: 03/07/2021 CLINICAL DATA:  Non-small cell lung cancer staging EXAM: CT CHEST WITH CONTRAST TECHNIQUE: Multidetector CT imaging of the chest was performed during intravenous contrast administration. CONTRAST:  12m OMNIPAQUE IOHEXOL 350 MG/ML SOLN COMPARISON:  02/04/2021 FINDINGS: Cardiovascular: Aortic atherosclerosis. Normal heart size. Left and right coronary artery calcifications and stents. No pericardial effusion. Mediastinum/Nodes: No significant change in prominent,, previously FDG avid mediastinal lymph nodes, largest pretracheal nodes measuring 1.0 x 0.8 cm (series 2, image 56). Thyroid gland, trachea, and esophagus demonstrate no significant findings. Lungs/Pleura: Unchanged mass of the right pulmonary apex, measuring 2.7 x 1.7 cm (series 5, image 41). Slight interval increase in a moderate, loculated right pleural, with extensive pleural thickening and nodularity, as well as septal thickening and nodularity throughout the lungs, which is increasingly confluent. Very extensive nodularity and septal thickening of the left lung is likewise worsened compared to prior examination. Upper Abdomen: No acute abnormality. Musculoskeletal: No chest wall mass or suspicious bone lesions identified. IMPRESSION: 1. Unchanged treated mass of the right pulmonary apex. 2. Slight interval increase in a moderate, loculated right pleural effusion, with extensive pleural thickening and nodularity, as well as septal thickening  and nodularity,  as well as septal thickening and nodularity throughout the lungs, which is increasingly confluent. Very extensive nodularity and septal thickening of the left lung is likewise worsened compared to prior examination. Findings are consistent with worsened pleural and lymphangitic metastatic disease. 3. No significant change in prominent, previously FDG avid mediastinal lymph nodes. 4. Coronary artery disease. Aortic Atherosclerosis (ICD10-I70.0). Electronically Signed   By: Eddie Candle M.D.   On: 03/07/2021 09:58   MR BRAIN W WO CONTRAST  Result Date: 02/27/2021  [facility] [address] [place], [place] [address] [phone] NEUROIMAGING REPORT STUDY DATE: 02/27/2021 PATIENT NAME: MAUDRY ZEIDAN DOB: 02/14/1951 MRN: [id number] EXAM: MRI Brain with and without contrast ORDERING CLINICIAN: Sarina Ill, MD CLINICAL HISTORY: 69 year old woman with history of lung cancer and strokes COMPARISON FILMS: MRI 02/11/2021 TECHNIQUE:MRI of the brain with and without contrast was obtained utilizing 5 mm axial slices with T1, T2, T2 flair, SWI and diffusion weighted views.  T1 sagittal, T2 coronal and postcontrast views in the axial and coronal plane were obtained. CONTRAST: 10 mL Multi[facility] IMAGING SITE: [facility], [place]. FINDINGS: On sagittal images, the spinal cord is imaged caudally to C4-C5 and is normal in caliber.   The contents of the posterior fossa are of normal size and position.   The pituitary gland and optic chiasm appear normal.    Brain volume appears normal.   The ventricles are normal in size and without distortion.  There are no abnormal extra-axial collections of fluid.  There are multiple foci of restriction on diffusion-weighted images in both hemispheres and the cerebellum consistent with subacute infarction, likely embolic, compared to the MRI from 02/11/2021, there are fewer foci, consistent with natural evolution.  There did not appear to be any new  foci not present on the previous MRI.  Elsewhere in the hemispheres, there are other T2/FLAIR hyperintense foci consistent with either prior emboli or chronic microvascular ischemic change.  The deep gray matter appears normal.  Susceptibility weighted images show some chronic heme products in the cerebellum, similar to the previous MRI. The orbits appear normal.   The VIIth/VIIIth nerve complex appears normal.  The mastoid air cells appear normal.  The paranasal sinuses appear normal.  Flow voids are identified within the major intracerebral arteries.  After the infusion of contrast material, there are some small foci of enhancement.  Several are located in the left occipital lobe and posterior left frontal lobe and right cerebellar hemisphere and are similar in appearance or possibly reduced in size compared to the 02/11/2021 MRI.  However, four more small enhancing foci in the gray matter or the gray-white junction are also noted in the right frontal lobe (series 17 image 115, 120 and 121) and were not clearly present on the previous MRI.   This MRI of the brain with and without contrast shows the following: 1.   Multiple T2/FLAIR hyperintense foci in the hemispheres, some of which have restricted diffusion consistent with subacute infarctions, likely embolic.  Compared to the 02/11/2021 MRI, most of these have evolved and are less hyperintense.  There do not appear to be any new foci on diffusion-weighted images. 2.   Some small foci in the left occipital lobe, left posterior frontal lobe, right frontal lobe and right cerebellar hemisphere enhance after contrast.  The four foci seen in the right frontal lobe was not present on the 02/11/2021 MRI.  Although most likely related to the recent strokes, the foci  in the right frontal lobe do not have corresponding DWI changes and metastasis cannot be ruled out.  Consider follow-up evaluation in several more weeks. INTERPRETING PHYSICIAN: Richard A. Felecia Shelling, MD, PhD, FAAN  Certified in  Neuroimaging by [employer]   OCT, Retina - [place] - Both Eyes  Result Date: 02/28/2021 Right Eye Quality was good. Findings include abnormal foveal contour, epiretinal membrane. Left Eye Quality was good. Progression has no prior data. Findings include normal foveal contour.   Color Fundus Photography Optos - OU - Both Eyes  Result Date: 02/28/2021 Right Eye Progression has no prior data. Disc findings include normal observations, increased cup to disc ratio. Macula : epiretinal membrane. Vessels : normal observations. Periphery : normal observations. Left Eye Progression has no prior data. Disc findings include increased cup to disc ratio. Macula : normal observations. Vessels : normal observations. Periphery : normal observations. Notes Myopic topographic distortion OD  IR IMAGING GUIDED PORT INSERTION  Result Date: 03/22/2021 INDICATION: 70 year old female with history of right lung adenocarcinoma requiring central venous access for chemotherapy administration. EXAM: IMPLANTED PORT A CATH PLACEMENT WITH ULTRASOUND AND FLUOROSCOPIC GUIDANCE COMPARISON:  None. MEDICATIONS: None. ANESTHESIA/SEDATION: Moderate (conscious) sedation was employed during this procedure. A total of Versed 3 mg and Fentanyl 100 mcg was administered intravenously. Moderate Sedation Time: 16 minutes. The patient's level of consciousness and vital signs were monitored continuously by radiology nursing throughout the procedure under my direct supervision. CONTRAST:  None FLUOROSCOPY TIME:  0 minutes, 12 seconds (0 mGy) COMPLICATIONS: None immediate. PROCEDURE: The procedure, risks, benefits, and alternatives were explained to the patient. Questions regarding the procedure were encouraged and answered. The patient understands and consents to the procedure. The right neck and chest were prepped with chlorhexidine in a sterile fashion, and a sterile drape was applied covering the operative field.  Maximum barrier sterile technique with sterile gowns and gloves were used for the procedure. A timeout was performed prior to the initiation of the procedure. Ultrasound was used to examine the jugular vein which was compressible and free of internal echoes. A skin marker was used to demarcate the planned venotomy and port pocket incision sites. Local anesthesia was provided to these sites and the subcutaneous tunnel track with 1% lidocaine with 1:100,000 epinephrine. A small incision was created at the jugular access site and blunt dissection was performed of the subcutaneous tissues. Under ultrasound guidance, the jugular vein was accessed with a 21 ga micropuncture needle and an 0.018" wire was inserted to the superior vena cava. Real-time ultrasound guidance was utilized for vascular access including the acquisition of a permanent ultrasound image documenting patency of the accessed vessel. A 5 Fr micopuncture set was then used, through which a 0.035" Rosen wire was passed under fluoroscopic guidance into the inferior vena cava. An 8 Fr dilator was then placed over the wire. A subcutaneous port pocket was then created along the upper chest wall utilizing a combination of sharp and blunt dissection. The pocket was irrigated with sterile saline, packed with gauze, and observed for hemorrhage. A single lumen "ISP" sized power injectable port was chosen for placement. The 8 Fr catheter was tunneled from the port pocket site to the venotomy incision. The port was placed in the pocket. The external catheter was trimmed to appropriate length. The dilator was exchanged for an 8 Fr peel-away sheath under fluoroscopic guidance. The catheter was then placed through the sheath and the sheath was removed. Final catheter positioning was confirmed and documented with  a fluoroscopic spot radiograph. The port was accessed with a Huber needle, aspirated, and flushed with heparinized saline. The deep dermal layer of the port  pocket incision was closed with interrupted 3-0 Vicryl suture. The skin was opposed with a running subcuticular 4-0 Monocryl suture. Dermabond was then placed over the port pocket and neck incisions. The patient tolerated the procedure well without immediate post procedural complication. FINDINGS: After catheter placement, the tip lies within the superior cavoatrial junction. The catheter aspirates and flushes normally and is ready for immediate use. IMPRESSION: Successful placement of a power injectable Port-A-Cath via the right internal jugular vein. The catheter is ready for immediate use. Ruthann Cancer, MD [facility] Electronically Signed   By: Ruthann Cancer M.D.   On: 03/22/2021 10:25     ASSESSMENT/PLAN:  This is a very pleasant 70 year old never smoker African-American female diagnosed with stage IV non-small cell lung cancer, adenocarcinoma.  She was positive for an EGFR mutation with deletion in exon 19.  She was diagnosed in July 2019 and presented with right upper lobe lung mass in addition to mediastinal lymphadenopathy as well as bilateral pulmonary nodules and malignant right pleural effusion.  The patient was started on treatment with Tagrisso 80 mg p.o. daily status post 37 months of treatment.  In April 2022, she showed evidence of disease progression with interval progression of the right apical lung mass in addition to progression of mediastinal lymphadenopathy concerning for worsening of her disease. She had repeat Guardant 360 molecular studies which did not show evidence of new resistant mutations.  Therefore, she was referred to radiation oncology and completed palliative ablative radiotherapy to the enlarging right upper lobe lung mass and mediastinum under the care of Dr. Sondra Come.  This was completed in July 2022.  Unfortunately, the patient recently was found to have evidence of disease progression.  Therefore, Dr. Julien Nordmann  started the patient on systemic chemotherapy with carboplatin for AUC of 5 and Alimta 500 mg per metered squared.  She is status post 1 cycle and she tolerated it fairly well despite her ongoing issues with fatigue, chest tightness, cough, and weight loss.  She is not a good candidate for Avastin due to her recent CVA.  She also is continuing to take Tagrisso as it is protective against progressive metastatic disease to the brain.   Labs were reviewed.  Her white blood cell count is low today.  Her ANC is 1.0.  She does not require any growth factor support at this time but neutropenic precautions was reviewed with the patient.  Denies any fever, sore throat, skin infections, diarrhea, or dysuria at this time.  She knows to call she develops any signs or symptoms of infection.  Advised to mask in public places, avoid crowds, and use diligent hand hygiene.  Her calcium is slightly elevated today.  The patient denies taking calcium or multivitamins.  She denies excessive consumption of Ensure and estimates that she only drinks Ensure once a week.  We will see her back for follow-up visit in 2 weeks for evaluation before starting cycle number 2.  Review constipation education with the patient.  She was advised to take a stool softener to help with her hard stools.  Of course if this causes loose stools, then she was advised to stop taking the stool softener.  Also advised to increase her activity, fluid intake, and fruits and vegetables to help decrease the risk of constipation.  Advised to use Robitussin  or Hycodan if needed for her cough.  Regarding her weight loss, I will send a prescription for Marinol to her pharmacy to help stimulate her appetite.  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  I will also refer her to the nutritionist for nutrition counseling.  She will have her stress test as scheduled by cardiology next week.  Also, discussed that she needs cataract surgery in the  future, that this is okay with her current treatment but we would recommend that she schedule this for her off week of treatment.  Discussed that after her first 12 weeks of treatment, she should have less appointments and it may be better to delay her cataract surgery if possible.  The patient was advised to call immediately if she has any concerning symptoms in the interval. The patient voices understanding of current disease status and treatment options and is in agreement with the current care plan. All questions were answered. The patient knows to call the clinic with any problems, questions or concerns. We can certainly see the patient much sooner if necessary      Orders Placed This Encounter  Procedures   Ambulatory Referral to [facility]    Referral Priority:   Routine    Referral Type:   Consultation    Referral Reason:   Specialty Services Required    Number of Visits Requested:   1      The total time spent in the appointment was 30-39 minutes.   Prakash Kimberling L Adonay Scheier, PA-C 03/24/21

## 2021-03-16 NOTE — Progress Notes (Signed)
Met with patient and accompanying adult in lobby to introduce myself as Arboriculturist and to offer available resources.   Discussed one-time $1000 Radio broadcast assistant to assist with personal expenses while going through treatment.  Gave her my card if interested in applying and for any additional financial questions or concerns.

## 2021-03-17 ENCOUNTER — Inpatient Hospital Stay: Payer: Medicare HMO

## 2021-03-17 ENCOUNTER — Other Ambulatory Visit: Payer: Self-pay | Admitting: Internal Medicine

## 2021-03-17 VITALS — BP 134/77 | HR 85 | Temp 97.6°F | Resp 19 | Wt 122.8 lb

## 2021-03-17 DIAGNOSIS — R0602 Shortness of breath: Secondary | ICD-10-CM | POA: Diagnosis not present

## 2021-03-17 DIAGNOSIS — C3491 Malignant neoplasm of unspecified part of right bronchus or lung: Secondary | ICD-10-CM

## 2021-03-17 DIAGNOSIS — Z79899 Other long term (current) drug therapy: Secondary | ICD-10-CM | POA: Diagnosis not present

## 2021-03-17 DIAGNOSIS — R0609 Other forms of dyspnea: Secondary | ICD-10-CM | POA: Diagnosis not present

## 2021-03-17 DIAGNOSIS — G893 Neoplasm related pain (acute) (chronic): Secondary | ICD-10-CM | POA: Diagnosis not present

## 2021-03-17 DIAGNOSIS — Z5111 Encounter for antineoplastic chemotherapy: Secondary | ICD-10-CM | POA: Diagnosis not present

## 2021-03-17 DIAGNOSIS — I10 Essential (primary) hypertension: Secondary | ICD-10-CM | POA: Diagnosis not present

## 2021-03-17 DIAGNOSIS — R059 Cough, unspecified: Secondary | ICD-10-CM | POA: Diagnosis not present

## 2021-03-17 DIAGNOSIS — C3411 Malignant neoplasm of upper lobe, right bronchus or lung: Secondary | ICD-10-CM | POA: Diagnosis not present

## 2021-03-17 LAB — CMP (CANCER CENTER ONLY)
ALT: 18 U/L (ref 0–44)
AST: 18 U/L (ref 15–41)
Albumin: 2.8 g/dL — ABNORMAL LOW (ref 3.5–5.0)
Alkaline Phosphatase: 49 U/L (ref 38–126)
Anion gap: 12 (ref 5–15)
BUN: 31 mg/dL — ABNORMAL HIGH (ref 8–23)
CO2: 25 mmol/L (ref 22–32)
Calcium: 10.9 mg/dL — ABNORMAL HIGH (ref 8.9–10.3)
Chloride: 102 mmol/L (ref 98–111)
Creatinine: 1.21 mg/dL — ABNORMAL HIGH (ref 0.44–1.00)
GFR, Estimated: 48 mL/min — ABNORMAL LOW (ref >60)
Glucose, Bld: 145 mg/dL — ABNORMAL HIGH (ref 70–99)
Potassium: 3.8 mmol/L (ref 3.5–5.1)
Sodium: 139 mmol/L (ref 135–145)
Total Bilirubin: 0.3 mg/dL (ref 0.3–1.2)
Total Protein: 6.1 g/dL — ABNORMAL LOW (ref 6.5–8.1)

## 2021-03-17 LAB — CBC WITH DIFFERENTIAL (CANCER CENTER ONLY)
Abs Immature Granulocytes: 0.04 10*3/uL (ref 0.00–0.07)
Basophils Absolute: 0 10*3/uL (ref 0.0–0.1)
Basophils Relative: 0 %
Eosinophils Absolute: 0 10*3/uL (ref 0.0–0.5)
Eosinophils Relative: 0 %
HCT: 40.7 % (ref 36.0–46.0)
Hemoglobin: 13 g/dL (ref 12.0–15.0)
Immature Granulocytes: 1 %
Lymphocytes Relative: 6 %
Lymphs Abs: 0.4 10*3/uL — ABNORMAL LOW (ref 0.7–4.0)
MCH: 26.6 pg (ref 26.0–34.0)
MCHC: 31.9 g/dL (ref 30.0–36.0)
MCV: 83.4 fL (ref 80.0–100.0)
Monocytes Absolute: 0.3 10*3/uL (ref 0.1–1.0)
Monocytes Relative: 4 %
Neutro Abs: 5.5 10*3/uL (ref 1.7–7.7)
Neutrophils Relative %: 89 %
Platelet Count: 101 10*3/uL — ABNORMAL LOW (ref 150–400)
RBC: 4.88 MIL/uL (ref 3.87–5.11)
RDW: 17.6 % — ABNORMAL HIGH (ref 11.5–15.5)
WBC Count: 6.2 10*3/uL (ref 4.0–10.5)
nRBC: 0 % (ref 0.0–0.2)

## 2021-03-17 MED ORDER — SODIUM CHLORIDE 0.9 % IV SOLN: Freq: Once | INTRAVENOUS | Status: AC

## 2021-03-17 MED ORDER — SODIUM CHLORIDE 0.9 % IV SOLN
500.0000 mg/m2 | Freq: Once | INTRAVENOUS | Status: AC
  Administered 2021-03-17: 800 mg via INTRAVENOUS
  Filled 2021-03-17: qty 20

## 2021-03-17 MED ORDER — SODIUM CHLORIDE 0.9 % IV SOLN
150.0000 mg | Freq: Once | INTRAVENOUS | Status: AC
  Administered 2021-03-17: 150 mg via INTRAVENOUS
  Filled 2021-03-17: qty 150

## 2021-03-17 MED ORDER — PALONOSETRON HCL INJECTION 0.25 MG/5ML: 0.2500 mg | Freq: Once | INTRAVENOUS | Status: AC

## 2021-03-17 MED ORDER — SODIUM CHLORIDE 0.9 % IV SOLN
318.5000 mg | Freq: Once | INTRAVENOUS | Status: AC
  Administered 2021-03-17: 320 mg via INTRAVENOUS
  Filled 2021-03-17: qty 32

## 2021-03-17 MED ORDER — PALONOSETRON HCL INJECTION 0.25 MG/5ML
INTRAVENOUS | Status: AC
  Administered 2021-03-17: 0.25 mg via INTRAVENOUS
  Filled 2021-03-17: qty 5

## 2021-03-17 MED ORDER — SODIUM CHLORIDE 0.9 % IV SOLN
10.0000 mg | Freq: Once | INTRAVENOUS | Status: AC
  Administered 2021-03-17: 10 mg via INTRAVENOUS
  Filled 2021-03-17: qty 10

## 2021-03-17 NOTE — Patient Instructions (Addendum)
[facility]  Discharge Instructions: Thank you for choosing [facility] to provide your oncology and hematology care.   If you have a lab appointment with the [facility], please go directly to the [facility] and check in at the registration area.   Wear comfortable clothing and clothing appropriate for easy access to any Portacath or PICC line.   We strive to give you quality time with your provider. You may need to reschedule your appointment if you arrive late (15 or more minutes).  Arriving late affects you and other patients whose appointments are after yours.  Also, if you miss three or more appointments without notifying the office, you may be dismissed from the clinic at the provider's discretion.      For prescription refill requests, have your pharmacy contact our office and allow 72 hours for refills to be completed.    Today you received the following chemotherapy and/or immunotherapy agents Pemetrexed (Alimta) and Carboplatin (Paraplatin).      To help prevent nausea and vomiting after your treatment, we encourage you to take your nausea medication as directed.  BELOW ARE SYMPTOMS THAT SHOULD BE REPORTED IMMEDIATELY: *FEVER GREATER THAN 100.4 F (38 C) OR HIGHER *CHILLS OR SWEATING *NAUSEA AND VOMITING THAT IS NOT CONTROLLED WITH YOUR NAUSEA MEDICATION *UNUSUAL SHORTNESS OF BREATH *UNUSUAL BRUISING OR BLEEDING *URINARY PROBLEMS (pain or burning when urinating, or frequent urination) *BOWEL PROBLEMS (unusual diarrhea, constipation, pain near the anus) TENDERNESS IN MOUTH AND THROAT WITH OR WITHOUT PRESENCE OF ULCERS (sore throat, sores in mouth, or a toothache) UNUSUAL RASH, SWELLING OR PAIN  UNUSUAL VAGINAL DISCHARGE OR ITCHING   Items with * indicate a potential emergency and should be followed up as soon as possible or go to the Emergency Department if any problems should occur.  Please show the CHEMOTHERAPY ALERT CARD or  IMMUNOTHERAPY ALERT CARD at check-in to the Emergency Department and triage nurse.  Should you have questions after your visit or need to cancel or reschedule your appointment, please contact [facility]  Dept: [phone]  and follow the prompts.  Office hours are 8:00 a.m. to 4:30 p.m. Monday - Friday. Please note that voicemails left after 4:00 p.m. may not be returned until the following business day.  We are closed weekends and major holidays. You have access to a nurse at all times for urgent questions. Please call the main number to the clinic Dept: [phone] and follow the prompts.   For any non-urgent questions, you may also contact your provider using MyChart. We now offer e-Visits for anyone 34 and older to request care online for non-urgent symptoms. For details visit [email or web address].   Also download the MyChart app! Go to the app store, search "MyChart", open the app, select [facility], and log in with your MyChart username and password.  Due to Covid, a mask is required upon entering the hospital/clinic. If you do not have a mask, one will be given to you upon arrival. For doctor visits, patients may have 1 support person aged 55 or older with them. For treatment visits, patients cannot have anyone with them due to current Covid guidelines and our immunocompromised population.   Pemetrexed injection What is this medication? PEMETREXED (PEM e TREX ed) is a chemotherapy drug used to treat lung cancers like non-small cell lung cancer and mesothelioma. It may also be used to treat other cancers. This medicine may be used for other purposes; ask  your health care provider or pharmacist if you have questions. COMMON BRAND NAME(S): Alimta, PEMFEXY What should I tell my care team before I take this medication? They need to know if you have any of these conditions: infection (especially a virus infection such as chickenpox, cold sores, or  herpes) kidney disease low blood counts, like low white cell, platelet, or red cell counts lung or breathing disease, like asthma radiation therapy an unusual or allergic reaction to pemetrexed, other medicines, foods, dyes, or preservative pregnant or trying to get pregnant breast-feeding How should I use this medication? This drug is given as an infusion into a vein. It is administered in a hospital or clinic by a specially trained health care professional. Talk to your pediatrician regarding the use of this medicine in children. Special care may be needed. Overdosage: If you think you have taken too much of this medicine contact a poison control center or emergency room at once. NOTE: This medicine is only for you. Do not share this medicine with others. What if I miss a dose? It is important not to miss your dose. Call your doctor or health care professional if you are unable to keep an appointment. What may interact with this medication? This medicine may interact with the following medications: Ibuprofen This list may not describe all possible interactions. Give your health care provider a list of all the medicines, herbs, non-prescription drugs, or dietary supplements you use. Also tell them if you smoke, drink alcohol, or use illegal drugs. Some items may interact with your medicine. What should I watch for while using this medication? Visit your doctor for checks on your progress. This drug may make you feel generally unwell. This is not uncommon, as chemotherapy can affect healthy cells as well as cancer cells. Report any side effects. Continue your course of treatment even though you feel ill unless your doctor tells you to stop. In some cases, you may be given additional medicines to help with side effects. Follow all directions for their use. Call your doctor or health care professional for advice if you get a fever, chills or sore throat, or other symptoms of a cold or flu. Do not  treat yourself. This drug decreases your body's ability to fight infections. Try to avoid being around people who are sick. This medicine may increase your risk to bruise or bleed. Call your doctor or health care professional if you notice any unusual bleeding. Be careful brushing and flossing your teeth or using a toothpick because you may get an infection or bleed more easily. If you have any dental work done, tell your dentist you are receiving this medicine. Avoid taking products that contain aspirin, acetaminophen, ibuprofen, naproxen, or ketoprofen unless instructed by your doctor. These medicines may hide a fever. Call your doctor or health care professional if you get diarrhea or mouth sores. Do not treat yourself. To protect your kidneys, drink water or other fluids as directed while you are taking this medicine. Do not become pregnant while taking this medicine or for 6 months after stopping it. Women should inform their doctor if they wish to become pregnant or think they might be pregnant. Men should not father a child while taking this medicine and for 3 months after stopping it. This may interfere with the ability to father a child. You should talk to your doctor or health care professional if you are concerned about your fertility. There is a potential for serious side effects to an unborn  child. Talk to your health care professional or pharmacist for more information. Do not breast-feed an infant while taking this medicine or for 1 week after stopping it. What side effects may I notice from receiving this medication? Side effects that you should report to your doctor or health care professional as soon as possible: allergic reactions like skin rash, itching or hives, swelling of the face, lips, or tongue breathing problems redness, blistering, peeling or loosening of the skin, including inside the mouth signs and symptoms of bleeding such as bloody or black, tarry stools; red or  dark-brown urine; spitting up blood or brown material that looks like coffee grounds; red spots on the skin; unusual bruising or bleeding from the eye, gums, or nose signs and symptoms of infection like fever or chills; cough; sore throat; pain or trouble passing urine signs and symptoms of kidney injury like trouble passing urine or change in the amount of urine signs and symptoms of liver injury like dark yellow or brown urine; general ill feeling or flu-like symptoms; light-colored stools; loss of appetite; nausea; right upper belly pain; unusually weak or tired; yellowing of the eyes or skin Side effects that usually do not require medical attention (report to your doctor or health care professional if they continue or are bothersome): constipation mouth sores nausea, vomiting unusually weak or tired This list may not describe all possible side effects. Call your doctor for medical advice about side effects. You may report side effects to FDA at 1-800-FDA-1088. Where should I keep my medication? This drug is given in a hospital or clinic and will not be stored at home. NOTE: This sheet is a summary. It may not cover all possible information. If you have questions about this medicine, talk to your doctor, pharmacist, or health care provider.  2022 Elsevier/Gold Standard (2017-07-25 16:11:33)  Carboplatin injection What is this medication? CARBOPLATIN (KAR boe pla tin) is a chemotherapy drug. It targets fast dividing cells, like cancer cells, and causes these cells to die. This medicine is used to treat ovarian cancer and many other cancers. This medicine may be used for other purposes; ask your health care provider or pharmacist if you have questions. COMMON BRAND NAME(S): Paraplatin What should I tell my care team before I take this medication? They need to know if you have any of these conditions: blood disorders hearing problems kidney disease recent or ongoing radiation therapy an  unusual or allergic reaction to carboplatin, cisplatin, other chemotherapy, other medicines, foods, dyes, or preservatives pregnant or trying to get pregnant breast-feeding How should I use this medication? This drug is usually given as an infusion into a vein. It is administered in a hospital or clinic by a specially trained health care professional. Talk to your pediatrician regarding the use of this medicine in children. Special care may be needed. Overdosage: If you think you have taken too much of this medicine contact a poison control center or emergency room at once. NOTE: This medicine is only for you. Do not share this medicine with others. What if I miss a dose? It is important not to miss a dose. Call your doctor or health care professional if you are unable to keep an appointment. What may interact with this medication? medicines for seizures medicines to increase blood counts like filgrastim, pegfilgrastim, sargramostim some antibiotics like amikacin, gentamicin, neomycin, streptomycin, tobramycin vaccines Talk to your doctor or health care professional before taking any of these medicines: acetaminophen aspirin ibuprofen ketoprofen naproxen This list  may not describe all possible interactions. Give your health care provider a list of all the medicines, herbs, non-prescription drugs, or dietary supplements you use. Also tell them if you smoke, drink alcohol, or use illegal drugs. Some items may interact with your medicine. What should I watch for while using this medication? Your condition will be monitored carefully while you are receiving this medicine. You will need important blood work done while you are taking this medicine. This drug may make you feel generally unwell. This is not uncommon, as chemotherapy can affect healthy cells as well as cancer cells. Report any side effects. Continue your course of treatment even though you feel ill unless your doctor tells you to  stop. In some cases, you may be given additional medicines to help with side effects. Follow all directions for their use. Call your doctor or health care professional for advice if you get a fever, chills or sore throat, or other symptoms of a cold or flu. Do not treat yourself. This drug decreases your body's ability to fight infections. Try to avoid being around people who are sick. This medicine may increase your risk to bruise or bleed. Call your doctor or health care professional if you notice any unusual bleeding. Be careful brushing and flossing your teeth or using a toothpick because you may get an infection or bleed more easily. If you have any dental work done, tell your dentist you are receiving this medicine. Avoid taking products that contain aspirin, acetaminophen, ibuprofen, naproxen, or ketoprofen unless instructed by your doctor. These medicines may hide a fever. Do not become pregnant while taking this medicine. Women should inform their doctor if they wish to become pregnant or think they might be pregnant. There is a potential for serious side effects to an unborn child. Talk to your health care professional or pharmacist for more information. Do not breast-feed an infant while taking this medicine. What side effects may I notice from receiving this medication? Side effects that you should report to your doctor or health care professional as soon as possible: allergic reactions like skin rash, itching or hives, swelling of the face, lips, or tongue signs of infection - fever or chills, cough, sore throat, pain or difficulty passing urine signs of decreased platelets or bleeding - bruising, pinpoint red spots on the skin, black, tarry stools, nosebleeds signs of decreased red blood cells - unusually weak or tired, fainting spells, lightheadedness breathing problems changes in hearing changes in vision chest pain high blood pressure low blood counts - This drug may decrease the  number of white blood cells, red blood cells and platelets. You may be at increased risk for infections and bleeding. nausea and vomiting pain, swelling, redness or irritation at the injection site pain, tingling, numbness in the hands or feet problems with balance, talking, walking trouble passing urine or change in the amount of urine Side effects that usually do not require medical attention (report to your doctor or health care professional if they continue or are bothersome): hair loss loss of appetite metallic taste in the mouth or changes in taste This list may not describe all possible side effects. Call your doctor for medical advice about side effects. You may report side effects to FDA at 1-800-FDA-1088. Where should I keep my medication? This drug is given in a hospital or clinic and will not be stored at home. NOTE: This sheet is a summary. It may not cover all possible information. If you have questions about  this medicine, talk to your doctor, pharmacist, or health care provider.  2022 Elsevier/Gold Standard (2007-09-10 14:38:05)  Fatigue If you have fatigue, you feel tired all the time and have a lack of energy or a lack of motivation. Fatigue may make it difficult to start or complete tasks because of exhaustion. In general, occasional or mild fatigue is often a normal response to activity or life. However, long-lasting (chronic) or extreme fatigue may be a symptom of a medical condition. Follow these instructions at home: General instructions Watch your fatigue for any changes. Go to bed and get up at the same time every day. Avoid fatigue by pacing yourself during the day and getting enough sleep at night. Maintain a healthy weight. Medicines Take over-the-counter and prescription medicines only as told by your health care provider. Take a multivitamin, if told by your health care provider.  Do not use herbal or dietary supplements unless they are approved by your  health care provider. Activity  Exercise regularly, as told by your health care provider. Use or practice techniques to help you relax, such as yoga, tai chi, meditation, or massage therapy. Eating and drinking  Avoid heavy meals in the evening. Eat a well-balanced diet, which includes lean proteins, whole grains, plenty of fruits and vegetables, and low-fat dairy products. Avoid consuming too much caffeine. Avoid the use of alcohol. Drink enough fluid to keep your urine pale yellow. Lifestyle Change situations that cause you stress. Try to keep your work and personal schedule in balance. Do not use any products that contain nicotine or tobacco, such as cigarettes and e-cigarettes. If you need help quitting, ask your health care provider. Do not use drugs. Contact a health care provider if: Your fatigue does not get better. You have a fever. You suddenly lose or gain weight. You have headaches. You have trouble falling asleep or sleeping through the night. You feel angry, guilty, anxious, or sad. You are unable to have a bowel movement (constipation). Your skin is dry. You have swelling in your legs or another part of your body. Get help right away if: You feel confused. Your vision is blurry. You feel faint or you pass out. You have a severe headache. You have severe pain in your abdomen, your back, or the area between your waist and hips (pelvis). You have chest pain, shortness of breath, or an irregular or fast heartbeat. You are unable to urinate, or you urinate less than normal. You have abnormal bleeding, such as bleeding from the rectum, vagina, nose, lungs, or nipples. You vomit blood. You have thoughts about hurting yourself or others. If you ever feel like you may hurt yourself or others, or have thoughts about taking your own life, get help right away. You can go to your nearest emergency department or call: Your local emergency services (911 in the [place] A suicide  crisis helpline, such as the [facility] at (208)252-7195. This is open 24 hours a day. Summary If you have fatigue, you feel tired all the time and have a lack of energy or a lack of motivation. Fatigue may make it difficult to start or complete tasks because of exhaustion. Long-lasting (chronic) or extreme fatigue may be a symptom of a medical condition. Exercise regularly, as told by your health care provider. Change situations that cause you stress. Try to keep your work and personal schedule in balance. This information is not intended to replace advice given to you by your health care provider. Make  sure you discuss any questions you have with your health care provider. Document Revised: 04/15/2020 Document Reviewed: 04/15/2020 Elsevier Patient Education  2022 [employer].  Rehydration, Adult Rehydration is the replacement of body fluids, salts, and minerals (electrolytes) that are lost during dehydration. Dehydration is when there is not enough water or other fluids in the body. This happens when you lose more fluids than you take in. Common causes of dehydration include: Not drinking enough fluids. This can occur when you are ill or doing activities that require a lot of energy, especially in hot weather. Conditions that cause loss of water or other fluids, such as diarrhea, vomiting, sweating, or urinating a lot. Other illnesses, such as fever or infection. Certain medicines, such as those that remove excess fluid from the body (diuretics). Symptoms of mild or moderate dehydration may include thirst, dry lips and mouth, and dizziness. Symptoms of severe dehydration may include increased heart rate, confusion, fainting, and not urinating. For severe dehydration, you may need to get fluids through an IV at the hospital. For mild or moderate dehydration, you can usually rehydrate at home by drinking certain fluids as told by your health care provider. What are the  risks? Generally, rehydration is safe. However, taking in too much fluid (overhydration) can be a problem. This is rare. Overhydration can cause an electrolyte imbalance, kidney failure, or a decrease in salt (sodium) levels in the body. Supplies needed You will need an oral rehydration solution (ORS) if your health care provider tells you to use one. This is a drink to treat dehydration. It can be found in pharmacies and retail stores. How to rehydrate Fluids Follow instructions from your health care provider for rehydration. The kind of fluid and the amount you should drink depend on your condition. In general, you should choose drinks that you prefer. If told by your health care provider, drink an ORS. Make an ORS by following instructions on the package. Start by drinking small amounts, about  cup (120 mL) every 5-10 minutes. Slowly increase how much you drink until you have taken the amount recommended by your health care provider. Drink enough clear fluids to keep your urine pale yellow. If you were told to drink an ORS, finish it first, then start slowly drinking other clear fluids. Drink fluids such as: Water. This includes sparkling water and flavored water. Drinking only water can lead to having too little sodium in your body (hyponatremia). Follow the advice of your health care provider. Water from ice chips you suck on. Fruit juice with water you add to it (diluted). Sports drinks. Hot or cold herbal teas. Broth-based soups. Milk or milk products. Food Follow instructions from your health care provider about what to eat while you rehydrate. Your health care provider may recommend that you slowly begin eating regular foods in small amounts. Eat foods that contain a healthy balance of electrolytes, such as bananas, oranges, potatoes, tomatoes, and spinach. Avoid foods that are greasy or contain a lot of sugar. In some cases, you may get nutrition through a feeding tube that is  passed through your nose and into your stomach (nasogastric tube, or NG tube). This may be done if you have uncontrolled vomiting or diarrhea. Beverages to avoid Certain beverages may make dehydration worse. While you rehydrate, avoid drinking alcohol. How to tell if you are recovering from dehydration You may be recovering from dehydration if: You are urinating more often than before you started rehydrating. Your urine is pale yellow. Your  energy level improves. You vomit less frequently. You have diarrhea less frequently. Your appetite improves or returns to normal. You feel less dizzy or less light-headed. Your skin tone and color start to look more normal. Follow these instructions at home: Take over-the-counter and prescription medicines only as told by your health care provider. Do not take sodium tablets. Doing this can lead to having too much sodium in your body (hypernatremia). Contact a health care provider if: You continue to have symptoms of mild or moderate dehydration, such as: Thirst. Dry lips. Slightly dry mouth. Dizziness. Dark urine or less urine than normal. Muscle cramps. You continue to vomit or have diarrhea. Get help right away if you: Have symptoms of dehydration that get worse. Have a fever. Have a severe headache. Have been vomiting and the following happens: Your vomiting gets worse or does not go away. Your vomit includes blood or green matter (bile). You cannot eat or drink without vomiting. Have problems with urination or bowel movements, such as: Diarrhea that gets worse or does not go away. Blood in your stool (feces). This may cause stool to look black and tarry. Not urinating, or urinating only a small amount of very dark urine, within 6-8 hours. Have trouble breathing. Have symptoms that get worse with treatment. These symptoms may represent a serious problem that is an emergency. Do not wait to see if the symptoms will go away. Get medical  help right away. Call your local emergency services (911 in the [place] Do not drive yourself to the hospital. Summary Rehydration is the replacement of body fluids and minerals (electrolytes) that are lost during dehydration. Follow instructions from your health care provider for rehydration. The kind of fluid and amount you should drink depend on your condition. Slowly increase how much you drink until you have taken the amount recommended by your health care provider. Contact your health care provider if you continue to show signs of mild or moderate dehydration. This information is not intended to replace advice given to you by your health care provider. Make sure you discuss any questions you have with your health care provider. Document Revised: 08/06/2019 Document Reviewed: 06/16/2019 Elsevier Patient Education  2022 [place] and Nutrition What you eat both during and after your cancer treatment plays a big part in your recovery. During treatment, eating well helps you stay strong, provides you with energy for tissue healing, and helps you fight infection. It may also help with side effects. When treatment ends and you start feeling better, eating well helps you rebuild tissue, gain strength and energy, and feel better overall. Choosing what is best to eat and drink during and after treatment can be a challenge. If you need help, ask to be referred to a registered dietitian. A dietitian can help you create a balanced eating plan. How to maintain healthy nutrition during cancer treatment What are tips for eating during treatment? During treatment, your tastes may change, so focus on foods that taste good to you. Eat your largest meal when you feel the most hungry. Limit drinking during the meal to increase food intake. Drink between meals to prevent dehydration. If you have lost your appetite or cannot keep food down, your cancer care team may suggest that you consume  foods or drinks that have had nutrients added to them (are fortified) or are higher in calories, fat, and protein to help prevent weight loss. If you are still not able to meet your nutrition needs,  you may benefit from other forms of nutrition support. Your care team may suggest appetite stimulants, tube feeding, or nutrition through an IV inserted into one of your veins. What foods should I eat during treatment?  Appetite is often altered during treatment. Some will have low appetite due to side effects of treatment. Others may gain weight due to medicines, less activity, and stress. Talk with a dietitian to help create the best eating plan for your situation. For a few meals each week, try eating plant-based foods that are high in protein, such as beans and peas, instead of meat. Include lots of whole grains, fruits, and vegetables. Eat at least 2 cups of fruits and vegetables a day. Include fresh fruits and dark-green and deep-yellow vegetables, which are full of natural, healthy substances. Remember to wash fresh produce thoroughly before eating to reduce any risk for infection. Throughout the day, eat small snacks that are high in calories and protein. High-protein snacks that are easy to prepare and eat include: Yogurt. Cereal and milk. Half of a sandwich. A bowl of hearty soup. Cheese and crackers. Avoid snacks that could make side effects from treatment worse. For instance, popcorn, raw fruits, and raw vegetables can make diarrhea worse. Dry, coarse snacks or foods high in acid can hurt your throat if it is already sore. Drink enough fluid to keep your urine pale yellow. Talk with your health care provider about taking any vitamins and supplements, as high doses of some can alter the effectiveness of treatment therapy. How to maintain healthy nutrition after cancer treatment The goals of your nutrition after cancer treatment are to achieve and maintain a healthy weight and to be physically  active on a regular basis. What are tips for eating after treatment? After cancer treatment, eat plenty of proteins, carbohydrates, and fats. You can get each of these dietary elements from a wide range of foods. You may also want to eat a diet rich in plant-based foods because those have been shown to be beneficial for cancer survivors. What foods should I eat after treatment? Here are some types of foods that you should include in your diet after treatment: Proteins. Protein is needed for growth, repairing body tissue and muscles, fighting infection, and helping to keep your body's defense system (immune system) healthy. When planning meals, choose lean proteins that do not have a lot of saturated fats. Include fish, lean meat, skinless poultry, eggs, nonfat and low-fat dairy products, nuts, seeds, and legumes (like beans, peas, and lentils) in your daily diet. Carbohydrates. Carbohydrates are your body's main source of energy. They provide the fuel you need for physical activity and for your organs to work well. Most of the carbohydrates in your diet should come from foods that are high in essential nutrients and fiber, such as vegetables, fruits, whole grains, and legumes. Fruits and vegetables. Eat at least 2-3 cups of vegetables and 1-2 cups of fruits each day. You may choose to eat them fresh, frozen, canned, raw, cooked, or dried. If you want to heat the fruits or vegetables, consider microwaving or steaming them rather than boiling them in water. Microwaving and steaming keep their nutrients better. Dairy. Choose low-fat milk and other low-fat dairy products. Fats and oils. Fats help boost energy in your body. Choose healthy fats from nuts, seeds, avocados, fatty fish, and vegetable oils, including olive, avocado, canola, peanut, safflower, grapeseed, and sesame. Beverages. All your body's cells need water to work well, so drink plenty of water. Drink  about eight 8-oz glasses of liquid  each day so your body's cells get the fluid they need. Keep in mind that foods such as soups, milk, and even ice cream count toward that goal. The items listed above may not be a complete list of foods and beverages you can eat. Contact a dietitian for more information. What foods and beverages should I avoid or limit? Maintaining a healthy weight is important for life after treatment. Check ingredients and nutrition facts on packaged foods and beverages. Talk with a registered dietitian to create a specific eating plan for you. Follow these recommendations for general healthy eating: Avoid beverages that are high in sugar, including juices. Avoiding these will help you stay at a healthy weight and help with blood sugar control. Limit high-fat foods, including fried foods and foods that come from animals. An example of a high-fat food from an animal is red meat, processed meats, or full-fat dairy. If you have diarrhea from treatment, fatty foods can make it worse. Limit foods that are high in added sugar, such as cookies, baked goods, and candy. Limit salt-cured, smoked, and pickled foods. Avoid alcohol. There is a link between alcohol and the risk of certain cancers. If you choose to drink alcohol, it is best to limit intake to 1 drink a day for nonpregnant women and 2 drinks a day for men. In the [place] one drink equals one 12 oz bottle of beer (355 mL), one 5 oz glass of wine (148 mL), or one 1 oz glass of hard liquor (44 mL). Depending on your cancer diagnosis, your health care provider may recommend that you do not drink alcohol at all. Limit foods that are high in saturated fats, such as cheese and butter. Saturated fats may raise your risk of cancer. Limit processed foods, such as packaged meals and snacks. The items listed above may not be a complete list of foods and beverages you should avoid. Contact a dietitian for more information. Summary Good nutrition plays a big part in healing and  keeping your body strong during and after cancer treatment. Ask to meet with a registered dietitian. A dietitian can help you create an eating plan that works well for you. During treatment, it may be helpful to eat small meals and snacks that are high in calories and protein. After cancer treatment, eat plenty of proteins, carbohydrates, and fats. You may also want to eat a diet rich in plant-based foods because those have been shown to be beneficial for cancer survivors. This information is not intended to replace advice given to you by your health care provider. Make sure you discuss any questions you have with your health care provider. Document Revised: 04/22/2019 Document Reviewed: 04/22/2019 Elsevier Patient Education  [employer].

## 2021-03-18 ENCOUNTER — Ambulatory Visit: Payer: Medicare HMO | Admitting: Physical Therapy

## 2021-03-18 ENCOUNTER — Ambulatory Visit (INDEPENDENT_AMBULATORY_CARE_PROVIDER_SITE_OTHER): Payer: Medicare HMO | Admitting: Internal Medicine

## 2021-03-18 ENCOUNTER — Encounter: Payer: Self-pay | Admitting: Internal Medicine

## 2021-03-18 ENCOUNTER — Other Ambulatory Visit: Payer: Self-pay

## 2021-03-18 VITALS — BP 130/70 | HR 68 | Temp 97.6°F | Ht 64.0 in | Wt 124.8 lb

## 2021-03-18 DIAGNOSIS — R059 Cough, unspecified: Secondary | ICD-10-CM

## 2021-03-18 DIAGNOSIS — Z23 Encounter for immunization: Secondary | ICD-10-CM | POA: Diagnosis not present

## 2021-03-18 DIAGNOSIS — C3490 Malignant neoplasm of unspecified part of unspecified bronchus or lung: Secondary | ICD-10-CM

## 2021-03-18 NOTE — Patient Instructions (Addendum)
Cough  - multifactorial  Plan  - per outpatient palliative care - hyocadan, morphine - complete prednisone taper 03/25/21 that was started in august 2022 - palliative care can start gabapentin if neeeded  NSCLC with EGFR mutation ([place])  - per Dr Julien Nordmann  - but will refer Dr Valeta Harms for consideration if repeat biopsy  Flu vaccine need  - high dose flu shot 03/18/2021 as long as no contraindication   Followup  - see Dr Valeta Harms  for consideration of repeat biopsy -  < 2 weeks

## 2021-03-18 NOTE — Progress Notes (Signed)
NAME:  Allison [name], MRN:  [id number], DOB:  [birth year], LOS: 3 ADMISSION DATE:  02/07/2021, CONSULTATION DATE:  02/09/21 REFERRING MD:  Dr Rhetta Mura and Sarajane Jews, CHIEF COMPLAINT:  Pneumonitis v Pneumonia v Lymphangitis  PCP Ronnell Freshwater, NP Onc: Dr Julien Nordmann GI: Dr Collene Mares Cards: Dr Gardiner Rhyme   brief    70 year old African-American female stage IV lung cancer as described below.  Followed by Dr. Julien Nordmann.  She had a most recent CT scan of the chest 02/04/2021.  On the CT scan she had decrease in the right upper lobe mass and mediastinal adenopathy compared to May 2022 but increasing right-sided pleural effusion but most importantly she had bilateral mid zone consolidation [could be radiation pneumonitis from July 2022] and also definite evidence of lymphangitis new compared to May 2022 [in July 2021 she only had right upper lobe mass that was most evident on the CT] at this visit she complained about not feeling well and febrile was admitted.  In addition the CT angiogram chest showed evidence of small pulmonary embolism.  She also has acute DVT.  She is on anticoagulation for this in the hhospital  In talking to the patient she tells me she has chronic cough since May 2022.  The cough is more in the daytime.  Less at night.  A previous course of prednisone for poison ivy and for the cough helped her.  She describes difficulty taking opioids because of "allergy".  Cough is the most debilitating symptom.  She started gabapentin for other reasons in the past but not for cough and has tolerated this well.  She also describes class III dyspnea on exertion relieved by rest.  She is not on oxygen.   She has been treated with antibiotics vancomycin and cefepime currently but procalcitonin, respiratory virus panel, COVID PCR, HIV and urine strep and leg neg  She is being seen by oncology   Seen by oncology 02/08/2021.  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  IV steroids have been started.     For the palliative treatment of cough oncology started dextromethorphan, Tessalon Perles with the understanding this had not been helpful in the past.  Opiates was offered but patient expressed reservation because of codeine products.   Echocardiogram with mild to moderate mitral regurgitation. Pertinent  Medical History  DIAGNOSIS: Stage IV (T2 a,N2, M1a) non-small cell lung cancer, adenocarcinoma diagnosed in July 2019 and presented with right upper lobe lung mass in addition to mediastinal lymphadenopathy as well as bilateral pulmonary nodules and malignant right pleural effusion.   Biomarker Findings Microsatellite status - Cannot Be Determined Tumor Mutational Burden - Cannot Be Determined Genomic Findings For a complete list of the genes assayed, please refer to the Appendix. EGFR exon 19 deletion ([id number]) TP53 Y220C 7 Disease relevant genes with no reportable alterations: KRAS, ALK, BRAF, MET, RET, ERBB2, ROS1    PRIOR THERAPY:  1) Status post right Pleurx catheter placement by Dr. Prescott Gum for drainage of malignant right pleural effusion. 2) palliative radiotherapy to the enlarging right upper lobe lung mass and mediastinum under the care of Dr. Sondra Come expected to be completed on January 05, 2021.   CURRENT THERAPY: TAGRISSO 80 mg p.o. daily.  First dose was given on 01/29/2018.  Status post 36 months of treatment.       has a past medical history of Anxiety, Arthritis, Asthma, Depression, GERD (gastroesophageal reflux  disease), Glaucoma, History of radiation therapy (01/05/2021), Hypertension, lung ca (dx'd 11/2017), Malignant pleural effusion, PONV (postoperative nausea and vomiting), Pre-diabetes, Raynaud's disease, and Raynaud's disease.    reports that she has never smoked. She has never used smokeless tobacco.   Significant Hospital Events: Including procedures, antibiotic start and stop dates in  addition to other pertinent events   02/07/2021 - admit 8/23 - echo normal. RSVP 30. On consult - steroids stgarted and stopped tagrissa 02/09/21 - ccm consult (onc starting steroids and stopping tagrissa)   Interim History / Subjective:    8/25- cough 15% better with steroids . STil lwith Rt sided infra axillary msk pain. Worried she might not better better.Amb walk est - did not desaturate. No fever since 02/07/21 Assessment & Plan:  Stage IV lung cancer at baseline and prior to and present on admission Chronic cough worsening: Baseline, prior to and present on admission Class III shortness of breath baseline prior to present on admission and worsening Status post radiation in July 2022 to the lung Checkpoint inhibitor therapy since 2019 with the risk of drug-induced pneumonitis   Current findings of   - progression with lymphangitis  - but also pulmonary embolism acute with acute DVT and  - likely/possible drug-induced pneumonitis. (Onc has held tagrissa and started steroids ) -   Presented with fever and suggestive of bacterial sepsis with so far no evidence - so fever likely from PE     Plan - DC vancomycin [procalcitonin negative and MRSA PCR negative] 8/24 -dc cefepime 02/10/21 - can finish empirici cephalexin for 3 mor days -Treat for drug-induced pneumonitis/radiation pneumonitis as outlined by oncology - steroids - Cough control - above with steroids + OTC cough med + teassolon - move top morphine syrup as opd if above does not wor (prior "rxn" was to vicodin after shoulder surgery years ago and heart "raced")   _ dyspnea control             - opd pulm rehab needed    OV 03/18/2021  Subjective:  Patient ID: Allison [name], female , DOB: 1951-01-24 , age 70 y.o. , MRN: [id number] , ADDRESS: [place] [id number] PCP [name], MD Patient Care Team: [name], MD as PCP - General (Internal Medicine) Donato Heinz, MD  as PCP - Cardiology (Cardiology) [name], MD as Consulting Physician (Radiation Oncology)  This Provider for this visit: Treatment Team:  Attending Provider: Brand Males, MD    03/18/2021 -   Chief Complaint  Patient presents with   Hospitalization Follow-up    Pt had two back-to-back hospitalizations. States  that she feels worse due to symptoms including chest tightness, SOB, cough with occ clear thick phlegm. Also states that she has had some wheezing.     HPI Allison [name] 70 y.o. -returns for her posthospital follow-up.  At the time we were consulted for cough.  The cough itself is multifactorial from cancer, checkpoint inhibitor pneumonitis.  And also cough neuropathy.  At this point in time she is on Hycodan and also morphine syrup.  She is not on gabapentin.  Her daughter is here with her.  She has palliative care visiting her at home trying to help with the symptoms.  She still has significant symptom burden.  In between she also got admitted for embolic stroke due to hypercoagulable state.  She has now been started on carboplatin and Taxol chemotherapy.  She is received 1 cycle.  She will  have a Port-A-Cath soon and get another cycle.  She says she has a lot of desire to continue to get full treatment.  Reviewed Dr. Worthy Flank note and indicated that he wants another lung biopsy to see if any malignant transformation of her non-small cell lung cancer.  Reviewed his note from 03/10/2021.  We discussed flu shot she is very skeptical.  We do not see any contraindications I recommended it.  She decided to take it.    CT Chest data  No results found.    PFT  No flowsheet data found.     has a past medical history of Anxiety, Arthritis, Asthma, Depression, GERD (gastroesophageal reflux disease), Glaucoma, History of radiation therapy (01/05/2021), Hypertension, lung ca (dx'd 11/2017), Malignant pleural effusion, PONV (postoperative nausea and vomiting), Pre-diabetes,  Raynaud's disease, and Raynaud's disease.   reports that she has never smoked. She has never used smokeless tobacco.  Past Surgical History:  Procedure Laterality Date   ABDOMINAL HYSTERECTOMY     partial   CHEST TUBE INSERTION Right 01/01/2018   Procedure: INSERTION PLEURAL DRAINAGE CATHETER;  Surgeon: Ivin Poot, MD;  Location: [facility];  Service: Thoracic;  Laterality: Right;   COLONOSCOPY     CORONARY STENT INTERVENTION N/A 06/16/2019   Procedure: CORONARY STENT INTERVENTION;  Surgeon: Jettie Booze, MD;  Location: [facility];  Service: Cardiovascular;  Laterality: N/A;   DILATION AND CURETTAGE OF UTERUS     EYE SURGERY     due to Glaucoma   LEFT HEART CATH AND CORONARY ANGIOGRAPHY N/A 06/16/2019   Procedure: LEFT HEART CATH AND CORONARY ANGIOGRAPHY;  Surgeon: Jettie Booze, MD;  Location: [facility];  Service: Cardiovascular;  Laterality: N/A;   REMOVAL OF PLEURAL DRAINAGE CATHETER Right 11/07/2018   Procedure: REMOVAL OF PLEURAL DRAINAGE CATHETER;  Surgeon: Ivin Poot, MD;  Location: [facility];  Service: Thoracic;  Laterality: Right;   ROTATOR CUFF REPAIR     TUBAL LIGATION     WISDOM TOOTH EXTRACTION      Allergies  Allergen Reactions   Penicillins Other (See Comments)    SYNCOPE PATIENT HAS HAD A PCN REACTION WITH IMMEDIATE RASH, FACIAL/TONGUE/THROAT SWELLING, SOB, OR LIGHTHEADEDNESS WITH HYPOTENSION:  #  #  YES  #  # Has patient had a PCN reaction causing severe rash involving mucus membranes or skin necrosis: No Has patient had a PCN reaction that required hospitalization: No Has patient had a PCN reaction occurring within the last 10 years: No    Vicodin [Hydrocodone-Acetaminophen] Other (See Comments)    Sped up heart and breathing   Other Other (See Comments)    Glaucoma eye drop    Immunization History  Administered Date(s) Administered   Fluad Quad(high Dose 65+) 03/24/2019, 05/04/2020, 03/18/2021   PFIZER(Purple  Top)SARS-COV-2 Vaccination 05/31/2020   Tdap 11/17/2012    Family History  Problem Relation Age of Onset   Heart disease Sister    Heart disease Brother    Lung cancer Other      Current Outpatient Medications:    acetaminophen (TYLENOL) 500 MG tablet, Take 1,000 mg by mouth every 6 (six) hours as needed (pain)., Disp: , Rfl:    ALPRAZolam (XANAX) 0.25 MG tablet, Take 0.25 mg by mouth 2 (two) times daily as needed for anxiety. , Disp: , Rfl:    amLODipine (NORVASC) 5 MG tablet, Take 5 mg by mouth daily., Disp: , Rfl:    aspirin 81 MG chewable tablet, Chew 1 tablet (  81 mg total) by mouth daily., Disp:  , Rfl:    b complex vitamins capsule, Take 1 capsule by mouth daily., Disp: , Rfl:    carvedilol (COREG) 12.5 MG tablet, Take 1 tablet (12.5 mg total) by mouth 2 (two) times daily with a meal., Disp: 180 tablet, Rfl: 3   cetirizine (ZYRTEC) 5 MG tablet, Take 5 mg by mouth daily., Disp: , Rfl:    chlorthalidone (HYGROTON) 25 MG tablet, Take 1 tablet (25 mg total) by mouth daily., Disp: 30 tablet, Rfl: 6   dorzolamide-timolol (COSOPT) 22.3-6.8 MG/ML ophthalmic solution, Place 1 drop into both eyes 2 (two) times daily., Disp: , Rfl:    enoxaparin (LOVENOX) 60 MG/0.6ML injection, Inject 0.6 mLs (60 mg total) into the skin every 12 (twelve) hours., Disp: 60 mL, Rfl: 1   esomeprazole (NEXIUM) 20 MG packet, Take 20 mg by mouth daily., Disp: , Rfl:    ezetimibe (ZETIA) 10 MG tablet, Take 10 mg by mouth daily. , Disp: , Rfl:    folic acid (FOLVITE) 1 MG tablet, Take 1 tablet (1 mg total) by mouth daily., Disp: 30 tablet, Rfl: 4   guaiFENesin-dextromethorphan (ROBITUSSIN DM) 100-10 MG/5ML syrup, Take 10 mLs by mouth 4 (four) times daily., Disp: 118 mL, Rfl: 0   HYDROcodone bit-homatropine (HYCODAN) 5-1.5 MG/5ML syrup, Take 5 mLs by mouth every 4 (four) hours as needed for cough., Disp: 120 mL, Rfl: 0   isosorbide mononitrate (IMDUR) 30 MG 24 hr tablet, Take 1 tablet (30 mg total) by mouth daily.,  Disp: 15 tablet, Rfl: 0   latanoprost (XALATAN) 0.005 % ophthalmic solution, Place 1 drop into both eyes at bedtime. , Disp: , Rfl:    lidocaine-prilocaine (EMLA) cream, Apply 1 application topically as needed., Disp: 30 g, Rfl: 0   magnesium oxide (MAG-OX) 400 MG tablet, Take 400 mg by mouth daily. Take 1/2 tablet daily, Disp: , Rfl:    mometasone-formoterol (DULERA) 100-5 MCG/ACT AERO, Inhale 2 puffs into the lungs 2 (two) times daily., Disp: 1 each, Rfl: 0   morphine 20 MG/5ML solution, Take 0.6 mLs (2.4 mg total) by mouth every 4 (four) hours as needed (shortness of breath)., Disp: 30 mL, Rfl: 0   osimertinib mesylate (TAGRISSO) 80 MG tablet, Take 80 mg by mouth daily., Disp: , Rfl:    OVER THE COUNTER MEDICATION, Take 1,200 mLs by mouth daily. Viatmin d take 1246m daily, Disp: , Rfl:    predniSONE (DELTASONE) 10 MG tablet, Take 696mdaily for 3days, Take 5017maily for 7days,Take 69m82mily for 7days,Take 30mg72mly for 7days,Take 20mg 53my for 7days,Take 10mg d42m for 7days, then stop, Disp: 120 tablet, Rfl: 0   Pseudoephedrine-Acetaminophen (CEPACOL CHILD SORE THROAT FORM PO), Take 1 lozenge by mouth. Take 1 as needed, Disp: , Rfl:    REPATHA SURECLICK 140 MG/502SOAJ, Inject 140 mg into the skin every 14 (fourteen) days., Disp: 6 mL, Rfl: 3   sucralfate (CARAFATE) 1 g tablet, Take 1 tablet (1 g total) by mouth 4 (four) times daily. with meals and at bedtime. Crush and dissolve in 10 mL of warm water prior to swallowing, Disp: 120 tablet, Rfl: 0   prochlorperazine (COMPAZINE) 10 MG tablet, Take 1 tablet (10 mg total) by mouth every 6 (six) hours as needed for nausea or vomiting. (Patient not taking: Reported on 03/18/2021), Disp: 30 tablet, Rfl: 0      Objective:   Vitals:   03/18/21 1406  BP: 130/70  Pulse: 68  Temp: 97.6  F (36.4 C)  TempSrc: Oral  SpO2: 95%  Weight: 124 lb 12.8 oz (56.6 kg)  Height: '5\' 4"'  (5.427 m)    Estimated body mass index is 21.42 kg/m as calculated  from the following:   Height as of this encounter: '5\' 4"'  (1.626 m).   Weight as of this encounter: 124 lb 12.8 oz (56.6 kg).  '@WEIGHTCHANGE' @  [employer]   03/18/21 1406  Weight: 124 lb 12.8 oz (56.6 kg)     Physical Exam Frail female with a cane.  Deconditioned.  Cachectic.  Has protein calorie malnutrition.  Clear to auscultation.  Abdomen soft      Assessment:       ICD-10-CM   1. Cough  R05.9     2. NSCLC with EGFR mutation ([place])  C34.90     3. Flu vaccine need  Z23     4. Need for immunization against influenza  Z23 Flu Vaccine QUAD High Dose(Fluad)         Plan:     Patient Instructions  Cough  - multifactorial  Plan  - per outpatient palliative care - hyocadan, morphine - complete prednisone taper 03/25/21 that was started in august 2022 - palliative care can start gabapentin if neeeded  NSCLC with EGFR mutation ([place])  - per Dr Julien Nordmann  - but will refer Dr Valeta Harms for consideration if repeat biopsy  Flu vaccine need  - high dose flu shot 03/18/2021 as long as no contraindication   Followup  - see Dr Valeta Harms  for consideration of repeat biopsy -  < 2 weeks    SIGNATURE    Dr. Brand Males, M.D., F.C.C.P,  Pulmonary and Critical Care Medicine Staff Physician, [facility] Director - Interstitial Lung Disease  Program  P[facility] [place], [place], [address]  Pager: [phone], If no answer or between  15:00h - 7:00h: call [phone] Telephone: [phone]  6:06 PM 03/18/2021

## 2021-03-21 ENCOUNTER — Other Ambulatory Visit: Payer: Self-pay | Admitting: Physician Assistant

## 2021-03-21 ENCOUNTER — Ambulatory Visit: Payer: Medicare HMO | Admitting: Physical Therapy

## 2021-03-21 DIAGNOSIS — H16223 Keratoconjunctivitis sicca, not specified as Sjogren's, bilateral: Secondary | ICD-10-CM | POA: Diagnosis not present

## 2021-03-21 DIAGNOSIS — I639 Cerebral infarction, unspecified: Secondary | ICD-10-CM | POA: Diagnosis not present

## 2021-03-21 DIAGNOSIS — H401131 Primary open-angle glaucoma, bilateral, mild stage: Secondary | ICD-10-CM | POA: Diagnosis not present

## 2021-03-21 DIAGNOSIS — H2513 Age-related nuclear cataract, bilateral: Secondary | ICD-10-CM | POA: Diagnosis not present

## 2021-03-21 DIAGNOSIS — H35371 Puckering of macula, right eye: Secondary | ICD-10-CM | POA: Diagnosis not present

## 2021-03-22 ENCOUNTER — Other Ambulatory Visit: Payer: Self-pay

## 2021-03-22 ENCOUNTER — Ambulatory Visit (HOSPITAL_COMMUNITY)
Admission: RE | Admit: 2021-03-22 | Discharge: 2021-03-22 | Disposition: A | Discharge status: Home / Self Care | Payer: Medicare HMO | Source: Ambulatory Visit | Attending: Internal Medicine | Admitting: Internal Medicine

## 2021-03-22 ENCOUNTER — Encounter (HOSPITAL_COMMUNITY): Payer: Self-pay

## 2021-03-22 ENCOUNTER — Telehealth (HOSPITAL_COMMUNITY): Payer: Self-pay

## 2021-03-22 ENCOUNTER — Other Ambulatory Visit: Payer: Self-pay | Admitting: Internal Medicine

## 2021-03-22 DIAGNOSIS — Z885 Allergy status to narcotic agent status: Secondary | ICD-10-CM | POA: Diagnosis not present

## 2021-03-22 DIAGNOSIS — Z880 Allergy status to penicillin: Secondary | ICD-10-CM | POA: Diagnosis not present

## 2021-03-22 DIAGNOSIS — C3491 Malignant neoplasm of unspecified part of right bronchus or lung: Secondary | ICD-10-CM | POA: Insufficient documentation

## 2021-03-22 DIAGNOSIS — Z79899 Other long term (current) drug therapy: Secondary | ICD-10-CM | POA: Diagnosis not present

## 2021-03-22 DIAGNOSIS — Z7982 Long term (current) use of aspirin: Secondary | ICD-10-CM | POA: Diagnosis not present

## 2021-03-22 DIAGNOSIS — Z452 Encounter for adjustment and management of vascular access device: Secondary | ICD-10-CM | POA: Diagnosis not present

## 2021-03-22 HISTORY — PX: IR IMAGING GUIDED PORT INSERTION: IMG5740

## 2021-03-22 LAB — CBC
HCT: 38.6 % (ref 36.0–46.0)
Hemoglobin: 12.5 g/dL (ref 12.0–15.0)
MCH: 27.4 pg (ref 26.0–34.0)
MCHC: 32.4 g/dL (ref 30.0–36.0)
MCV: 84.6 fL (ref 80.0–100.0)
Platelets: 123 10*3/uL — ABNORMAL LOW (ref 150–400)
RBC: 4.56 MIL/uL (ref 3.87–5.11)
RDW: 16.6 % — ABNORMAL HIGH (ref 11.5–15.5)
WBC: 3.4 10*3/uL — ABNORMAL LOW (ref 4.0–10.5)
nRBC: 0 % (ref 0.0–0.2)

## 2021-03-22 LAB — PROTIME-INR
INR: 1 (ref 0.8–1.2)
Prothrombin Time: 13.6 seconds (ref 11.4–15.2)

## 2021-03-22 MED ORDER — LIDOCAINE-EPINEPHRINE (PF) 2 %-1:200000 IJ SOLN
INTRAMUSCULAR | Status: AC
  Filled 2021-03-22: qty 20

## 2021-03-22 MED ORDER — HEPARIN SOD (PORK) LOCK FLUSH 100 UNIT/ML IV SOLN
INTRAVENOUS | Status: AC
  Filled 2021-03-22: qty 5

## 2021-03-22 MED ORDER — FENTANYL CITRATE (PF) 100 MCG/2ML IJ SOLN
INTRAMUSCULAR | Status: AC
  Filled 2021-03-22: qty 2

## 2021-03-22 MED ORDER — MIDAZOLAM HCL 2 MG/2ML IJ SOLN
INTRAMUSCULAR | Status: AC
  Filled 2021-03-22: qty 4

## 2021-03-22 MED ORDER — HEPARIN SOD (PORK) LOCK FLUSH 100 UNIT/ML IV SOLN
INTRAVENOUS | Status: DC | PRN
  Administered 2021-03-22: 500 [IU] via INTRAVENOUS

## 2021-03-22 MED ORDER — LIDOCAINE-EPINEPHRINE (PF) 1 %-1:200000 IJ SOLN
INTRAMUSCULAR | Status: DC | PRN
  Administered 2021-03-22: 20 mL

## 2021-03-22 MED ORDER — FENTANYL CITRATE (PF) 100 MCG/2ML IJ SOLN
INTRAMUSCULAR | Status: DC | PRN
  Administered 2021-03-22: 50 ug via INTRAVENOUS

## 2021-03-22 MED ORDER — MIDAZOLAM HCL 2 MG/2ML IJ SOLN
INTRAMUSCULAR | Status: DC | PRN
  Administered 2021-03-22 (×2): 1 mg via INTRAVENOUS

## 2021-03-22 MED ORDER — SODIUM CHLORIDE 0.9 % IV SOLN: INTRAVENOUS | Status: DC

## 2021-03-22 NOTE — H&P (Signed)
Referring Physician(s): Mohamed,Mohamed  Supervising Physician: Ruthann Cancer  Patient Status:  [facility]  Chief Complaint:  "I'm here to get a port a cath"  Subjective: Patient known to IR service from right thoracentesis in 2019.  She has a history of stage IV adenocarcinoma of the lung diagnosed in July 2019 and presented with a right upper lobe lung mass in addition to mediastinal lymphadenopathy as well as bilateral pulmonary nodules and malignant right pleural effusion.  She has had a prior right Pleurx by Dr. Darcey Nora as well as palliative radiotherapy which was completed in July of this year.  She is currently undergoing chemotherapy and is on Tagrisso as well.  She does have a history of acute PE in August of this year.  She presents today for Port-A-Cath placement to assist with additional treatment.  She currently denies fever, headache, chest pain, abdominal/back pain, nausea, vomiting or bleeding.  She does have chronic dyspnea and occasional cough.  Past Medical History:  Diagnosis Date   Anxiety    Arthritis    Asthma    exercise induced   Depression    PMH   GERD (gastroesophageal reflux disease)    Glaucoma    History of radiation therapy 01/05/2021   IMRT right lung  11/24/2020-01/05/2021  Dr [name]   Hypertension    lung ca dx'd 11/2017   right   Malignant pleural effusion    right   PONV (postoperative nausea and vomiting)    Pre-diabetes    Raynaud's disease    Raynaud's disease    Past Surgical History:  Procedure Laterality Date   ABDOMINAL HYSTERECTOMY     partial   CHEST TUBE INSERTION Right 01/01/2018   Procedure: INSERTION PLEURAL DRAINAGE CATHETER;  Surgeon: Ivin Poot, MD;  Location: [facility];  Service: Thoracic;  Laterality: Right;   COLONOSCOPY     CORONARY STENT INTERVENTION N/A 06/16/2019   Procedure: CORONARY STENT INTERVENTION;  Surgeon: Jettie Booze, MD;  Location: [facility];  Service: Cardiovascular;  Laterality:  N/A;   DILATION AND CURETTAGE OF UTERUS     EYE SURGERY     due to Glaucoma   LEFT HEART CATH AND CORONARY ANGIOGRAPHY N/A 06/16/2019   Procedure: LEFT HEART CATH AND CORONARY ANGIOGRAPHY;  Surgeon: Jettie Booze, MD;  Location: [facility];  Service: Cardiovascular;  Laterality: N/A;   REMOVAL OF PLEURAL DRAINAGE CATHETER Right 11/07/2018   Procedure: REMOVAL OF PLEURAL DRAINAGE CATHETER;  Surgeon: Ivin Poot, MD;  Location: [facility];  Service: Thoracic;  Laterality: Right;   ROTATOR CUFF REPAIR     TUBAL LIGATION     WISDOM TOOTH EXTRACTION        Allergies: Penicillins, Vicodin [hydrocodone-acetaminophen], and Other  Medications: Prior to Admission medications   Medication Sig Start Date End Date Taking? Authorizing Provider  acetaminophen (TYLENOL) 500 MG tablet Take 1,000 mg by mouth every 6 (six) hours as needed (pain).   Yes [provider]  ALPRAZolam (XANAX) 0.25 MG tablet Take 0.25 mg by mouth 2 (two) times daily as needed for anxiety.    Yes [provider]  amLODipine (NORVASC) 5 MG tablet Take 5 mg by mouth daily. 11/23/20  Yes [provider]  aspirin 81 MG chewable tablet Chew 1 tablet (81 mg total) by mouth daily. 06/18/19  Yes Georgette Shell, MD  b complex vitamins capsule Take 1 capsule by mouth daily.   Yes [provider]  carvedilol (COREG) 12.5 MG tablet Take 1 tablet (12.5 mg total) by mouth 2 (two) times daily with a meal. 03/16/21  Yes Donato Heinz, MD  cetirizine (ZYRTEC) 5 MG tablet Take 5 mg by mouth daily.   Yes [provider]  chlorthalidone (HYGROTON) 25 MG tablet Take 1 tablet (25 mg total) by mouth daily. 03/14/21 06/12/21 Yes Donato Heinz, MD  dorzolamide-timolol (COSOPT) 22.3-6.8 MG/ML ophthalmic solution Place 1 drop into both eyes 2 (two) times daily.   Yes [provider]  enoxaparin (LOVENOX) 60 MG/0.6ML injection Inject 0.6 mLs (60 mg total) into the  skin every 12 (twelve) hours. 02/16/21  Yes Caren Griffins, MD  esomeprazole (NEXIUM) 20 MG packet Take 20 mg by mouth daily.   Yes [provider]  ezetimibe (ZETIA) 10 MG tablet Take 10 mg by mouth daily.    Yes [provider]  folic acid (FOLVITE) 1 MG tablet Take 1 tablet (1 mg total) by mouth daily. 03/10/21  Yes Curt Bears, MD  guaiFENesin-dextromethorphan [facility] DM) 100-10 MG/5ML syrup Take 10 mLs by mouth 4 (four) times daily. 02/15/21  Yes Lavina Hamman, MD  HYDROcodone bit-homatropine (HYCODAN) 5-1.5 MG/5ML syrup Take 5 mLs by mouth every 4 (four) hours as needed for cough. 02/15/21  Yes Lavina Hamman, MD  isosorbide mononitrate (IMDUR) 30 MG 24 hr tablet Take 1 tablet (30 mg total) by mouth daily. 02/16/21  Yes Gherghe, Vella Redhead, MD  latanoprost (XALATAN) 0.005 % ophthalmic solution Place 1 drop into both eyes at bedtime.  12/14/17  Yes [provider]  magnesium oxide (MAG-OX) 400 MG tablet Take 400 mg by mouth daily. Take 1/2 tablet daily   Yes [provider]  osimertinib mesylate (TAGRISSO) 80 MG tablet Take 80 mg by mouth daily.   Yes [provider]  OVER THE COUNTER MEDICATION Take 1,200 mLs by mouth daily. Viatmin d take 1246ml daily   Yes [provider]  predniSONE (DELTASONE) 10 MG tablet Take 60mg  daily for 3days, Take 50mg  daily for 7days,Take 40mg  daily for 7days,Take 30mg  daily for 7days,Take 20mg  daily for 7days,Take 10mg  daily for 7days, then stop 02/15/21  Yes Lavina Hamman, MD  Pseudoephedrine-Acetaminophen (CEPACOL CHILD SORE THROAT FORM PO) Take 1 lozenge by mouth. Take 1 as needed   Yes [provider]  REPATHA SURECLICK 824 MG/ML SOAJ Inject 140 mg into the skin every 14 (fourteen) days. 03/15/21  Yes Donato Heinz, MD  lidocaine-prilocaine (EMLA) cream Apply 1 application topically as needed. 03/10/21   Curt Bears, MD  mometasone-formoterol [facility]) 100-5 MCG/ACT AERO Inhale 2  puffs into the lungs 2 (two) times daily. 02/15/21   Lavina Hamman, MD  morphine 20 MG/5ML solution Take 0.6 mLs (2.4 mg total) by mouth every 4 (four) hours as needed (shortness of breath). 02/10/21   Donne Hazel, MD  prochlorperazine (COMPAZINE) 10 MG tablet Take 1 tablet (10 mg total) by mouth every 6 (six) hours as needed for nausea or vomiting. Patient not taking: Reported on 03/18/2021 03/10/21   Curt Bears, MD  sucralfate (CARAFATE) 1 g tablet Take 1 tablet (1 g total) by mouth 4 (four) times daily. with meals and at bedtime. Crush and dissolve in 10 mL of warm water prior to swallowing 02/11/21   Kyung Rudd, MD     Vital Signs: BP (!) 164/91 (BP Location: Right Arm)   Pulse 99   Temp 97.9 F (36.6 C) (Oral)   Resp 16  LMP  (LMP Unknown)   SpO2 97%   Physical Exam patient awake, alert.  Chest with diminished breath sounds right base, left clear.  Heart with regular rate and rhythm.  Abdomen soft, positive bowel sounds, nontender.  No significant lower extremity edema.  Imaging: No results found.  Labs:  CBC: Recent Labs    02/16/21 0333 02/23/21 0844 03/17/21 0828 03/22/21 0820  WBC 9.9 11.8* 6.2 3.4*  HGB 10.2* 12.1 13.0 12.5  HCT 31.9* 37.5 40.7 38.6  PLT 183 248 101* 123*    COAGS: Recent Labs    02/07/21 1640 02/13/21 0417 02/14/21 0113 02/14/21 0923 02/14/21 2243 02/15/21 0731 03/22/21 0820  INR 1.1  --   --   --   --   --  1.0  APTT  --    < > 59* 82* 103* 73*  --    < > = values in this interval not displayed.    BMP: Recent Labs    06/15/20 0853 08/16/20 0834 02/15/21 0731 02/16/21 0333 02/23/21 0844 03/04/21 1516 03/17/21 0828  NA 140   < > 140 138 140 140 139  K 4.5   < > 3.8 4.0 5.0 4.6 3.8  CL 101   < > 107 107 105 100 102  CO2 28   < > 27 23 25 25 25   GLUCOSE 91   < > 90 100* 119* 129* 145*  BUN 15   < > 20 16 21 21  31*  CALCIUM 9.6   < > 9.2 9.1 9.8 9.9 10.9*  CREATININE 1.12*   < > 1.05* 1.04* 1.05* 1.02* 1.21*   GFRNONAA 50*   < > 57* 58* 57*  --  48*  GFRAA 58*  --   --   --   --   --   --    < > = values in this interval not displayed.    LIVER FUNCTION TESTS: Recent Labs    02/12/21 1040 02/13/21 0417 02/23/21 0844 03/17/21 0828  BILITOT 0.5 0.6 0.4 0.3  AST 29 36 29 18  ALT 31 46* 87* 18  ALKPHOS 46 53 55 49  PROT 5.7* 6.3* 7.0 6.1*  ALBUMIN 2.8* 3.0* 3.5 2.8*    Assessment and Plan: Patient known to IR service from right thoracentesis in 2019.  She has a history of stage IV adenocarcinoma of the lung diagnosed in July 2019 and presented with a right upper lobe lung mass in addition to mediastinal lymphadenopathy as well as bilateral pulmonary nodules and malignant right pleural effusion.  She has had a prior right Pleurx by Dr. Darcey Nora as well as palliative radiotherapy which was completed in July of this year.  She is currently undergoing chemotherapy and is on Tagrisso as well.  She does have a history of acute PE in August of this year.  She presents today for Port-A-Cath placement to assist with additional treatment. Risks and benefits of image guided port-a-catheter placement was discussed with the patient including, but not limited to bleeding, infection, pneumothorax, or fibrin sheath development and need for additional procedures.  All of the patient's questions were answered, patient is agreeable to proceed. Consent signed and in chart.    Electronically Signed: D. Rowe Robert, PA-C 03/22/2021, 8:48 AM   I spent a total of  25 minutes at the the patient's bedside AND on the patient's hospital floor or unit, greater than 50% of which was counseling/coordinating care for Port-A-Cath placement

## 2021-03-22 NOTE — Discharge Instructions (Signed)
Please call Interventional Radiology clinic [phone] with any questions or concerns.  You may remove your dressing and shower tomorrow.  DO NOT use EMLA cream on your port site for 2 weeks as this cream will remove surgical glue on your incision.  Implanted Port Insertion, Care After This sheet gives you information about how to care for yourself after your procedure. Your health care provider may also give you more specific instructions. If you have problems or questions, contact your health care provider. What can I expect after the procedure? After the procedure, it is common to have: Discomfort at the port insertion site. Bruising on the skin over the port. This should improve over 3-4 days. Follow these instructions at home: [facility] care After your port is placed, you will get a manufacturer's information card. The card has information about your port. Keep this card with you at all times. Take care of the port as told by your health care provider. Ask your health care provider if you or a family member can get training for taking care of the port at home. A home health care nurse may also take care of the port. Make sure to remember what type of port you have. Incision care Follow instructions from your health care provider about how to take care of your port insertion site. Make sure you: Wash your hands with soap and water before and after you change your bandage (dressing). If soap and water are not available, use hand sanitizer. Change your dressing as told by your health care provider. Leave stitches (sutures), skin glue, or adhesive strips in place. These skin closures may need to stay in place for 2 weeks or longer. If adhesive strip edges start to loosen and curl up, you may trim the loose edges. Do not remove adhesive strips completely unless your health care provider tells you to do that. Check your port insertion site every day for signs of infection. Check for: Redness,  swelling, or pain. Fluid or blood. Warmth. Pus or a bad smell.        Activity Return to your normal activities as told by your health care provider. Ask your health care provider what activities are safe for you. Do not lift anything that is heavier than 10 lb (4.5 kg), or the limit that you are told, until your health care provider says that it is safe. General instructions Take over-the-counter and prescription medicines only as told by your health care provider. Do not take baths, swim, or use a hot tub until your health care provider approves. Ask your health care provider if you may take showers. You may only be allowed to take sponge baths. Do not drive for 24 hours if you were given a sedative during your procedure. Wear a medical alert bracelet in case of an emergency. This will tell any health care providers that you have a port. Keep all follow-up visits as told by your health care provider. This is important. Contact a health care provider if: You cannot flush your port with saline as directed, or you cannot draw blood from the port. You have a fever or chills. You have redness, swelling, or pain around your port insertion site. You have fluid or blood coming from your port insertion site. Your port insertion site feels warm to the touch. You have pus or a bad smell coming from the port insertion site. Get help right away if: You have chest pain or shortness of breath. You have bleeding from  your port that you cannot control. Summary Take care of the port as told by your health care provider. Keep the manufacturer's information card with you at all times. Change your dressing as told by your health care provider. Contact a health care provider if you have a fever or chills or if you have redness, swelling, or pain around your port insertion site. Keep all follow-up visits as told by your health care provider. This information is not intended to replace advice given to you  by your health care provider. Make sure you discuss any questions you have with your health care provider. Document Revised: 01/01/2018 Document Reviewed: 01/01/2018 Elsevier Patient Education  2021 [place].   Moderate Conscious Sedation, Adult, Care After This sheet gives you information about how to care for yourself after your procedure. Your health care provider may also give you more specific instructions. If you have problems or questions, contact your health care provider. What can I expect after the procedure? After the procedure, it is common to have: Sleepiness for several hours. Impaired judgment for several hours. Difficulty with balance. Vomiting if you eat too soon. Follow these instructions at home: For the time period you were told by your health care provider: Rest. Do not participate in activities where you could fall or become injured. Do not drive or use machinery. Do not drink alcohol. Do not take sleeping pills or medicines that cause drowsiness. Do not make important decisions or sign legal documents. Do not take care of children on your own.        Eating and drinking Follow the diet recommended by your health care provider. Drink enough fluid to keep your urine pale yellow. If you vomit: Drink water, juice, or soup when you can drink without vomiting. Make sure you have little or no nausea before eating solid foods.    General instructions Take over-the-counter and prescription medicines only as told by your health care provider. Have a responsible adult stay with you for the time you are told. It is important to have someone help care for you until you are awake and alert. Do not smoke. Keep all follow-up visits as told by your health care provider. This is important. Contact a health care provider if: You are still sleepy or having trouble with balance after 24 hours. You feel light-headed. You keep feeling nauseous or you keep vomiting. You  develop a rash. You have a fever. You have redness or swelling around the IV site. Get help right away if: You have trouble breathing. You have new-onset confusion at home. Summary After the procedure, it is common to feel sleepy, have impaired judgment, or feel nauseous if you eat too soon. Rest after you get home. Know the things you should not do after the procedure. Follow the diet recommended by your health care provider and drink enough fluid to keep your urine pale yellow. Get help right away if you have trouble breathing or new-onset confusion at home. This information is not intended to replace advice given to you by your health care provider. Make sure you discuss any questions you have with your health care provider. Document Revised: 10/03/2019 Document Reviewed: 05/01/2019 Elsevier Patient Education  2021 [employer].

## 2021-03-22 NOTE — Procedures (Signed)
Interventional Radiology Procedure Note ° °Procedure: Single Lumen Power Port Placement   ° °Access:  Right internal jugular vein ° °Findings: Catheter tip positioned at cavoatrial junction. Port is ready for immediate use.  ° °Complications: None ° °EBL: < 10 mL ° °Recommendations:  °- Ok to shower in 24 hours °- Do not submerge for 7 days °- Routine line care  ° ° °Quetzally Callas, MD ° ° ° °

## 2021-03-22 NOTE — Telephone Encounter (Signed)
Detailed instructions left on the patient's answering machine. Asked to call back with any questions. S.Farra Nikolic EMTP 

## 2021-03-23 ENCOUNTER — Ambulatory Visit (HOSPITAL_COMMUNITY)
Admission: RE | Admit: 2021-03-23 | Discharge: 2021-03-23 | Disposition: A | Discharge status: Home / Self Care | Payer: Medicare HMO | Source: Ambulatory Visit | Attending: Radiology | Admitting: Radiology

## 2021-03-23 ENCOUNTER — Other Ambulatory Visit (HOSPITAL_COMMUNITY): Payer: Self-pay | Admitting: Radiology

## 2021-03-23 DIAGNOSIS — T8189XA Other complications of procedures, not elsewhere classified, initial encounter: Secondary | ICD-10-CM | POA: Diagnosis not present

## 2021-03-23 DIAGNOSIS — Z95828 Presence of other vascular implants and grafts: Secondary | ICD-10-CM | POA: Diagnosis not present

## 2021-03-23 DIAGNOSIS — Y848 Other medical procedures as the cause of abnormal reaction of the patient, or of later complication, without mention of misadventure at the time of the procedure: Secondary | ICD-10-CM | POA: Diagnosis not present

## 2021-03-23 DIAGNOSIS — R233 Spontaneous ecchymoses: Secondary | ICD-10-CM | POA: Diagnosis not present

## 2021-03-23 DIAGNOSIS — L7622 Postprocedural hemorrhage and hematoma of skin and subcutaneous tissue following other procedure: Secondary | ICD-10-CM | POA: Insufficient documentation

## 2021-03-23 MED ORDER — SILVER NITRATE-POT NITRATE 75-25 % EX MISC
CUTANEOUS | Status: AC
  Filled 2021-03-23: qty 10

## 2021-03-23 MED ORDER — SILVER NITRATE-POT NITRATE 75-25 % EX MISC
CUTANEOUS | Status: DC | PRN
  Administered 2021-03-23: 1 via TOPICAL

## 2021-03-24 ENCOUNTER — Other Ambulatory Visit: Payer: Medicare HMO

## 2021-03-24 ENCOUNTER — Ambulatory Visit: Payer: Medicare HMO

## 2021-03-24 ENCOUNTER — Inpatient Hospital Stay: Payer: Medicare HMO

## 2021-03-24 ENCOUNTER — Inpatient Hospital Stay: Payer: Medicare HMO | Attending: Internal Medicine | Admitting: Physician Assistant

## 2021-03-24 ENCOUNTER — Other Ambulatory Visit: Payer: Self-pay

## 2021-03-24 VITALS — BP 146/75 | HR 85 | Temp 97.5°F | Resp 19 | Ht 64.0 in | Wt 120.2 lb

## 2021-03-24 DIAGNOSIS — R059 Cough, unspecified: Secondary | ICD-10-CM | POA: Insufficient documentation

## 2021-03-24 DIAGNOSIS — Z86711 Personal history of pulmonary embolism: Secondary | ICD-10-CM | POA: Insufficient documentation

## 2021-03-24 DIAGNOSIS — C7802 Secondary malignant neoplasm of left lung: Secondary | ICD-10-CM | POA: Diagnosis not present

## 2021-03-24 DIAGNOSIS — C3411 Malignant neoplasm of upper lobe, right bronchus or lung: Secondary | ICD-10-CM | POA: Diagnosis present

## 2021-03-24 DIAGNOSIS — K5900 Constipation, unspecified: Secondary | ICD-10-CM | POA: Diagnosis not present

## 2021-03-24 DIAGNOSIS — R0602 Shortness of breath: Secondary | ICD-10-CM | POA: Insufficient documentation

## 2021-03-24 DIAGNOSIS — C3491 Malignant neoplasm of unspecified part of right bronchus or lung: Secondary | ICD-10-CM

## 2021-03-24 DIAGNOSIS — Z8673 Personal history of transient ischemic attack (TIA), and cerebral infarction without residual deficits: Secondary | ICD-10-CM | POA: Insufficient documentation

## 2021-03-24 DIAGNOSIS — Z923 Personal history of irradiation: Secondary | ICD-10-CM | POA: Insufficient documentation

## 2021-03-24 DIAGNOSIS — Z5111 Encounter for antineoplastic chemotherapy: Secondary | ICD-10-CM | POA: Insufficient documentation

## 2021-03-24 DIAGNOSIS — Z86718 Personal history of other venous thrombosis and embolism: Secondary | ICD-10-CM | POA: Diagnosis not present

## 2021-03-24 DIAGNOSIS — Z7901 Long term (current) use of anticoagulants: Secondary | ICD-10-CM | POA: Insufficient documentation

## 2021-03-24 DIAGNOSIS — J910 Malignant pleural effusion: Secondary | ICD-10-CM | POA: Insufficient documentation

## 2021-03-24 DIAGNOSIS — C771 Secondary and unspecified malignant neoplasm of intrathoracic lymph nodes: Secondary | ICD-10-CM | POA: Insufficient documentation

## 2021-03-24 DIAGNOSIS — R634 Abnormal weight loss: Secondary | ICD-10-CM | POA: Diagnosis not present

## 2021-03-24 DIAGNOSIS — G893 Neoplasm related pain (acute) (chronic): Secondary | ICD-10-CM | POA: Insufficient documentation

## 2021-03-24 DIAGNOSIS — I10 Essential (primary) hypertension: Secondary | ICD-10-CM | POA: Insufficient documentation

## 2021-03-24 LAB — CMP (CANCER CENTER ONLY)
ALT: 50 U/L — ABNORMAL HIGH (ref 0–44)
AST: 28 U/L (ref 15–41)
Albumin: 2.8 g/dL — ABNORMAL LOW (ref 3.5–5.0)
Alkaline Phosphatase: 49 U/L (ref 38–126)
Anion gap: 10 (ref 5–15)
BUN: 26 mg/dL — ABNORMAL HIGH (ref 8–23)
CO2: 31 mmol/L (ref 22–32)
Calcium: 10.9 mg/dL — ABNORMAL HIGH (ref 8.9–10.3)
Chloride: 97 mmol/L — ABNORMAL LOW (ref 98–111)
Creatinine: 1.08 mg/dL — ABNORMAL HIGH (ref 0.44–1.00)
GFR, Estimated: 55 mL/min — ABNORMAL LOW (ref >60)
Glucose, Bld: 105 mg/dL — ABNORMAL HIGH (ref 70–99)
Potassium: 4.4 mmol/L (ref 3.5–5.1)
Sodium: 138 mmol/L (ref 135–145)
Total Bilirubin: 0.4 mg/dL (ref 0.3–1.2)
Total Protein: 6.3 g/dL — ABNORMAL LOW (ref 6.5–8.1)

## 2021-03-24 LAB — CBC WITH DIFFERENTIAL (CANCER CENTER ONLY)
Abs Immature Granulocytes: 0.01 10*3/uL (ref 0.00–0.07)
Basophils Absolute: 0 10*3/uL (ref 0.0–0.1)
Basophils Relative: 0 %
Eosinophils Absolute: 0 10*3/uL (ref 0.0–0.5)
Eosinophils Relative: 3 %
HCT: 35.7 % — ABNORMAL LOW (ref 36.0–46.0)
Hemoglobin: 11.4 g/dL — ABNORMAL LOW (ref 12.0–15.0)
Immature Granulocytes: 1 %
Lymphocytes Relative: 17 %
Lymphs Abs: 0.2 10*3/uL — ABNORMAL LOW (ref 0.7–4.0)
MCH: 26.7 pg (ref 26.0–34.0)
MCHC: 31.9 g/dL (ref 30.0–36.0)
MCV: 83.6 fL (ref 80.0–100.0)
Monocytes Absolute: 0.1 10*3/uL (ref 0.1–1.0)
Monocytes Relative: 7 %
Neutro Abs: 1 10*3/uL — ABNORMAL LOW (ref 1.7–7.7)
Neutrophils Relative %: 72 %
Platelet Count: 94 10*3/uL — ABNORMAL LOW (ref 150–400)
RBC: 4.27 MIL/uL (ref 3.87–5.11)
RDW: 16.4 % — ABNORMAL HIGH (ref 11.5–15.5)
WBC Count: 1.3 10*3/uL — ABNORMAL LOW (ref 4.0–10.5)
nRBC: 0 % (ref 0.0–0.2)

## 2021-03-24 MED ORDER — DRONABINOL 2.5 MG PO CAPS: 2.5000 mg | ORAL_CAPSULE | Freq: Two times a day (BID) | ORAL | 0 refills | Status: DC

## 2021-03-24 MED ORDER — PROCHLORPERAZINE MALEATE 10 MG PO TABS
ORAL_TABLET | ORAL | Status: AC
  Filled 2021-03-24: qty 1

## 2021-03-24 NOTE — Patient Instructions (Signed)
Thank you for choosing [facility] to provide your care.   Should you have questions after your visit to the [facility] ([facility]), please contact this office at [phone] between 8:30 AM and 4:30 PM.  Voice mails left after 4:00 PM may not be returned until the following business day.  Calls received after 4:30 PM will be answered by an off-site Nurse Triage Line.    Prescription Refills:  Please have your pharmacy contact us directly for most prescription requests.  Contact the office directly for refills of narcotics (pain medications). Allow 48-72 hours for refills.  Appointments: Please contact the [facility] scheduling department [phone] for questions regarding [facility] appointment scheduling.  Contact the schedulers with any scheduling changes so that your appointment can be rescheduled in a timely manner.   Central Scheduling for [place] [phone] - Call to schedule procedures such as PET scans, CT scans, MRI, Ultrasound, etc.  To afford each patient quality time with our providers, please arrive 30 minutes before your scheduled appointment time.  If you arrive late for your appointment, you may be asked to reschedule.  We strive to give you quality time with our providers, and arriving late affects you and other patients whose appointments are after yours. If you are a no show for multiple scheduled visits, you may be dismissed from the clinic at the providers discretion.     Resources: [facility] Social Workers [phone] for additional information on assistance programs or assistance connecting with community support programs   [place] DSS  [phone]: Information regarding food stamps, Medicaid, and utility assistance [place] Access [place] [phone]   [place] [employer]'s shared-ride transportation service for eligible riders who have a disability that prevents them from riding the fixed route bus.   Medicare Rights Center 800-333-4114  Helps people with Medicare understand their rights and benefits, navigate the Medicare system, and secure the quality healthcare they deserve [employer] 800-227-2345 Assists patients locate various types of support and financial assistance Cancer Care: 1-800-813-HOPE (4673) Provides financial assistance, online support groups, medication/co-pay assistance.   Transportation Assistance for appointments at [facility]: Transportation Coordinator [phone]  Again, thank you for choosing [facility] for your care.       

## 2021-03-25 ENCOUNTER — Telehealth: Payer: Self-pay

## 2021-03-25 ENCOUNTER — Ambulatory Visit: Payer: Medicare HMO | Admitting: Thoracic Surgery (Cardiothoracic Vascular Surgery)

## 2021-03-25 NOTE — Telephone Encounter (Signed)
Call made to [facility] for Prior Authorization request for Dronabinol 2.5mg  Capsules. Medication approved from 06/19/2020 through 09/21/2021. Case ID # [id number]. Patient notified.

## 2021-03-28 ENCOUNTER — Ambulatory Visit: Payer: Medicare HMO | Admitting: Physical Therapy

## 2021-03-29 ENCOUNTER — Ambulatory Visit (HOSPITAL_COMMUNITY): Payer: Medicare HMO | Attending: Cardiology

## 2021-03-29 ENCOUNTER — Other Ambulatory Visit: Payer: Self-pay

## 2021-03-29 DIAGNOSIS — I2510 Atherosclerotic heart disease of native coronary artery without angina pectoris: Secondary | ICD-10-CM | POA: Insufficient documentation

## 2021-03-29 DIAGNOSIS — R079 Chest pain, unspecified: Secondary | ICD-10-CM | POA: Insufficient documentation

## 2021-03-29 LAB — MYOCARDIAL PERFUSION IMAGING
LV dias vol: 39 mL (ref 46–106)
LV sys vol: 13 mL
Nuc Stress EF: 68 %
Peak HR: 115 {beats}/min
Rest HR: 92 {beats}/min
Rest Nuclear Isotope Dose: 10.5 mCi
SDS: 3
SRS: 1
SSS: 4
ST Depression (mm): 0 mm
Stress Nuclear Isotope Dose: 30.1 mCi
TID: 0.85

## 2021-03-29 MED ORDER — TECHNETIUM TC 99M TETROFOSMIN IV KIT
10.5000 | PACK | Freq: Once | INTRAVENOUS | Status: AC | PRN
  Administered 2021-03-29: 10.5 via INTRAVENOUS
  Filled 2021-03-29: qty 11

## 2021-03-29 MED ORDER — TECHNETIUM TC 99M TETROFOSMIN IV KIT
30.1000 | PACK | Freq: Once | INTRAVENOUS | Status: AC | PRN
  Administered 2021-03-29: 30.1 via INTRAVENOUS
  Filled 2021-03-29: qty 31

## 2021-03-29 MED ORDER — REGADENOSON 0.4 MG/5ML IV SOLN
0.4000 mg | Freq: Once | INTRAVENOUS | Status: AC
  Administered 2021-03-29: 0.4 mg via INTRAVENOUS

## 2021-03-29 NOTE — Progress Notes (Signed)
[facility] OFFICE PROGRESS NOTE  Ronnell Freshwater, NP [place] [address]  DIAGNOSIS:  Stage IV (T2 a,N2, M1a) non-small cell lung cancer, adenocarcinoma diagnosed in July 2019 and presented with right upper lobe lung mass in addition to mediastinal lymphadenopathy as well as bilateral pulmonary nodules and malignant right pleural effusion.   Biomarker Findings Microsatellite status - Cannot Be Determined Tumor Mutational Burden - Cannot Be Determined Genomic Findings For a complete list of the genes assayed, please refer to the Appendix. EGFR exon 19 deletion ([id number]) TP53 Y220C 7 Disease relevant genes with no reportable alterations: KRAS, ALK, BRAF, MET, RET, ERBB2, ROS1   PRIOR THERAPY: 1) Status post right Pleurx catheter placement by Dr. Prescott Gum for drainage of malignant right pleural effusion. 2) palliative radiotherapy to the enlarging right upper lobe lung mass and mediastinum under the care of Dr. Sondra Come expected to be completed on January 05, 2021. 3) Tagrisso 80 mg p.o. daily.  First dose was given on 01/29/2018.  Status post 37 months of treatment.    CURRENT THERAPY:  Systemic chemotherapy with carboplatin for AUC of 5 and Alimta 500 Mg/M2 every 3 weeks.  First dose 03/17/2021.  The patient will also continue her current treatment with Tagrisso 80 mg p.o. daily.  She is status post 1 cycle.  INTERVAL HISTORY: Allison [name] 70 y.o. female returns to the clinic today for a follow-up visit.  The patient was last seen in the clinic 2 weeks ago.  The patient was recently found to have evidence of disease progression and her treatment was switched to palliative systemic chemotherapy with carboplatin and Alimta.  She is not a good candidate for Avastin due to her recent stroke in August 2022.  She is also continuing her targeted treatment with Tagrisso due to it crossing the blood-brain barrier.  She tolerated her first cycle fairly well  except for fatigue and constipation despite her overall poor performance status.   Related to her breathing, the patient has been having some continued significant shortness of breath.  The patient states that her breathing is "bad every day" so it is difficult for her to tell if its been worse since she was last seen. She has been having some ongoing chest tightness since completing radiation.  She will take 1000 mg of tylenol as needed which does help her chest pressure somewhat but does not resolve it completely.  She is expected to receive a new nebulizer today.  She uses Robitussin for cough.  Of note, she also has a loculated right pleural effusion.  She is expected to have a bronchoscopy under the care of pulmonologist, Dr. Valeta Harms, on 04/21/2021 for rebiopsy to send off for molecular studies to see if she developed any new actionable mutations.  The patient is concerned about having a bronchoscopy due to recent bleeding concerns with her Port-A-Cath (see below).   The patient had a Port-A-Cath placed on 03/22/2021.  The patient had significant bleeding following this procedure and was seen again by IR the following day and had this area cauterized.  She has some persistent oozing of blood from the site and darkening of the skin/bruising in this area.  She saw them again last week and they advised her to keep the area covered with gauze until the bleeding stops.  Of note, the patient is on Lovenox which she started on in August 2022 with her stroke.  She also had a left lower extremity  DVT in August 2022.  She also has a history of PE. The patient denies any other bleeding.  She has a follow-up with her neurologist next week on 04/12/2021.  The patient has also been having some trouble with decreased appetite and weight loss.  She is followed by member the nutritionist team.  She is expected to see them again on 04/28/2021.  At her last appointment, she was prescribed Marinol to help with her appetite. She  is not a good candidate for Megace due to her recent CVA.  She had some drug to drug interactions with Remeron; therefore, she was not a good candidate for this.  She did recently start taking Marinol and notes that it does make her sleepy.  He lost approximately 1 pound since her last appointment.   She denies any fevers  or chills.  He had some nausea this morning.  She took Colace once for constipation.  She notes that this morning she had 3 bowel movements 1 of which was loose.  She did not try taking any Imodium.  She is for the lung cancer support group which she enjoys tremendously. She is here today for evaluation and repeat blood work before considering starting starting cycle #2.     MEDICAL HISTORY: Past Medical History:  Diagnosis Date   Anxiety    Arthritis    Asthma    exercise induced   Depression    PMH   GERD (gastroesophageal reflux disease)    Glaucoma    History of radiation therapy 01/05/2021   IMRT right lung  11/24/2020-01/05/2021  Dr [name]   Hypertension    lung ca dx'd 11/2017   right   Malignant pleural effusion    right   PONV (postoperative nausea and vomiting)    Pre-diabetes    Raynaud's disease    Raynaud's disease     ALLERGIES:  is allergic to penicillins, vicodin [hydrocodone-acetaminophen], and other.  MEDICATIONS:  Current Outpatient Medications  Medication Sig Dispense Refill   acetaminophen (TYLENOL) 500 MG tablet Take 1,000 mg by mouth every 6 (six) hours as needed (pain).     ALPRAZolam (XANAX) 0.25 MG tablet Take 0.25 mg by mouth 2 (two) times daily as needed for anxiety.      amLODipine (NORVASC) 5 MG tablet Take 5 mg by mouth daily.     aspirin 81 MG chewable tablet Chew 1 tablet (81 mg total) by mouth daily.     b complex vitamins capsule Take 1 capsule by mouth daily.     carvedilol (COREG) 12.5 MG tablet Take 1 tablet (12.5 mg total) by mouth 2 (two) times daily with a meal. 180 tablet 3   cetirizine (ZYRTEC) 5 MG tablet Take  5 mg by mouth daily.     chlorthalidone (HYGROTON) 25 MG tablet Take 1 tablet (25 mg total) by mouth daily. 30 tablet 6   dorzolamide-timolol (COSOPT) 22.3-6.8 MG/ML ophthalmic solution Place 1 drop into both eyes 2 (two) times daily.     dronabinol (MARINOL) 2.5 MG capsule Take 1 capsule (2.5 mg total) by mouth 2 (two) times daily before a meal. 60 capsule 0   enoxaparin (LOVENOX) 60 MG/0.6ML injection Inject 0.6 mLs (60 mg total) into the skin every 12 (twelve) hours. 60 mL 1   esomeprazole (NEXIUM) 20 MG packet Take 20 mg by mouth daily.     ezetimibe (ZETIA) 10 MG tablet Take 10 mg by mouth daily.      folic acid (FOLVITE) 1  MG tablet Take 1 tablet (1 mg total) by mouth daily. 30 tablet 4   guaiFENesin-dextromethorphan (ROBITUSSIN DM) 100-10 MG/5ML syrup Take 10 mLs by mouth 4 (four) times daily. 118 mL 0   HYDROcodone bit-homatropine (HYCODAN) 5-1.5 MG/5ML syrup Take 5 mLs by mouth every 4 (four) hours as needed for cough. 120 mL 0   isosorbide mononitrate (IMDUR) 30 MG 24 hr tablet Take 1 tablet (30 mg total) by mouth daily. 15 tablet 0   latanoprost (XALATAN) 0.005 % ophthalmic solution Place 1 drop into both eyes at bedtime.      lidocaine-prilocaine (EMLA) cream Apply 1 application topically as needed. 30 g 0   magnesium oxide (MAG-OX) 400 MG tablet Take 400 mg by mouth daily. Take 1/2 tablet daily     morphine 20 MG/5ML solution Take 0.6 mLs (2.4 mg total) by mouth every 4 (four) hours as needed (shortness of breath). 30 mL 0   osimertinib mesylate (TAGRISSO) 80 MG tablet Take 80 mg by mouth daily.     OVER THE COUNTER MEDICATION Take 1,200 mLs by mouth daily. Viatmin d take 1270m daily     predniSONE (DELTASONE) 10 MG tablet Take 651mdaily for 3days, Take 5050maily for 7days,Take 32m28mily for 7days,Take 30mg24mly for 7days,Take 20mg 68my for 7days,Take 10mg d60m for 7days, then stop 120 tablet 0   prochlorperazine (COMPAZINE) 10 MG tablet Take 1 tablet (10 mg total) by mouth  every 6 (six) hours as needed for nausea or vomiting. 30 tablet 0   Pseudoephedrine-Acetaminophen (CEPACOL CHILD SORE THROAT FORM PO) Take 1 lozenge by mouth. Take 1 as needed     REPATHA SURECLICK 140 MG/791SOAJ Inject 140 mg into the skin every 14 (fourteen) days. 6 mL 3   sucralfate (CARAFATE) 1 g tablet Take 1 tablet (1 g total) by mouth 4 (four) times daily. with meals and at bedtime. Crush and dissolve in 10 mL of warm water prior to swallowing 120 tablet 0   SYMBICORT 80-4.5 MCG/ACT inhaler Inhale 2 puffs into the lungs 2 (two) times daily.     No current facility-administered medications for this visit.   Facility-Administered Medications Ordered in Other Visits  Medication Dose Route Frequency Provider Last Rate Last Admin   CARBOplatin (PARAPLATIN) 360 mg in sodium chloride 0.9 % 250 mL chemo infusion  360 mg Intravenous Once MohamedCurt Bears    PEMEtrexed (ALIMTA) 800 mg in sodium chloride 0.9 % 100 mL chemo infusion  500 mg/m2 (Treatment Plan Recorded) Intravenous Once MohamedCurt Bears    prochlorperazine (COMPAZINE) 10 MG tablet             SURGICAL HISTORY:  Past Surgical History:  Procedure Laterality Date   ABDOMINAL HYSTERECTOMY     partial   CHEST TUBE INSERTION Right 01/01/2018   Procedure: INSERTION PLEURAL DRAINAGE CATHETER;  Surgeon: Van TriIvin PootLocation: [facility]; [place] Thor[place];  Laterality: Right;   COLONOSCOPY     CORONARY STENT INTERVENTION N/A 06/16/2019   Procedure: CORONARY STENT INTERVENTION;  Surgeon: VaranasJettie BoozeLocation: [facility];  Service: Cardiovascular;  Laterality: N/A;   DILATION AND CURETTAGE OF UTERUS     EYE SURGERY     due to Glaucoma   IR IMAGING GUIDED PORT INSERTION  03/22/2021   LEFT HEART CATH AND CORONARY ANGIOGRAPHY N/A 06/16/2019   Procedure: LEFT HEART CATH AND CORONARY ANGIOGRAPHY;  Surgeon: VaranasJettie Booze  Location: [facility];  Service: Cardiovascular;  Laterality:  N/A;   REMOVAL OF PLEURAL DRAINAGE CATHETER Right 11/07/2018   Procedure: REMOVAL OF PLEURAL DRAINAGE CATHETER;  Surgeon: Ivin Poot, MD;  Location: [facility];  Service: Thoracic;  Laterality: Right;   ROTATOR CUFF REPAIR     TUBAL LIGATION     WISDOM TOOTH EXTRACTION      REVIEW OF SYSTEMS:   Review of Systems  Constitutional: Positive for fatigue, appetite change, weight loss.  Negative for chills and fever.  HENT: Negative for mouth sores, nosebleeds, sore throat and trouble swallowing.   Eyes: Negative for eye problems and icterus.  Respiratory: Positive for cough and dyspnea.  Negative for hemoptysis and wheezing.   Cardiovascular: Positive for chest tightness with exertion.  Negative for  leg swelling.  Gastrointestinal: Positive for loose stool this morning.  Also for nausea this morning.  Negative for abdominal pain, diarrhea, and vomiting.  Genitourinary: Negative for bladder incontinence, difficulty urinating, dysuria, frequency and hematuria.   Musculoskeletal: Negative for back pain, gait problem, neck pain and neck stiffness.  Skin: Negative for itching and rash.  Neurological: Positive for generalized weakness.  Negative for dizziness, extremity weakness, gait problem, headaches, light-headedness and seizures.  Hematological: Positive for oozing of dark blood from her Port-A-Cath site.  Negative for adenopathy.  Denies any other bleeding or bruising. Psychiatric/Behavioral: Negative for confusion, depression and sleep disturbance. The patient is not nervous/anxious.    PHYSICAL EXAMINATION:  Blood pressure (!) 155/75, pulse 84, temperature (!) 97.5 F (36.4 C), temperature source Tympanic, resp. rate 19, height '5\' 4"'  (1.626 m), weight 118 lb 6.4 oz (53.7 kg), SpO2 91 %.  ECOG PERFORMANCE STATUS: 2-3  Physical Exam  Constitutional: Oriented to person, place, and time and thin appearing female, and in no distress.  HENT:  Head: Normocephalic and atraumatic.   Mouth/Throat: Oropharynx is clear and moist. No oropharyngeal exudate.  Eyes: Conjunctivae are normal. Right eye exhibits no discharge. Left eye exhibits no discharge. No scleral icterus.  Neck: Normal range of motion. Neck supple.  Cardiovascular: Normal rate, regular rhythm, normal heart sounds and intact distal pulses.   Pulmonary/Chest: Effort normal.  Decreased breath sounds in the right lower lobe.  No respiratory distress. No rales.  Abdominal: Soft. Bowel sounds are normal. Exhibits no distension and no mass. There is no tenderness.  Musculoskeletal: Normal range of motion. Exhibits no edema.  Lymphadenopathy:    No cervical adenopathy.  Neurological: Alert and oriented to person, place, and time. Exhibits muscle wasting.  Examined in the wheelchair. Skin: Bruising and mild oozing of black/old blood from Port-A-Cath site/gauze.  Skin is warm and dry. No rash noted. Not diaphoretic. No erythema. No pallor.  Psychiatric: Mood, memory and judgment normal.  Vitals reviewed.  LABORATORY DATA: Lab Results  Component Value Date   WBC 4.1 04/07/2021   HGB 11.0 (L) 04/07/2021   HCT 34.4 (L) 04/07/2021   MCV 82.3 04/07/2021   PLT 444 (H) 04/07/2021      Chemistry      Component Value Date/Time   NA 136 04/07/2021 1004   NA 140 03/04/2021 1516   K 3.3 (L) 04/07/2021 1004   CL 98 04/07/2021 1004   CO2 26 04/07/2021 1004   BUN 14 04/07/2021 1004   BUN 21 03/04/2021 1516   CREATININE 0.99 04/07/2021 1004      Component Value Date/Time   CALCIUM 11.1 (H) 04/07/2021 1004   ALKPHOS 70 04/07/2021 1004  AST 34 04/07/2021 1004   ALT 32 04/07/2021 1004   BILITOT <0.2 (L) 04/07/2021 1004       RADIOGRAPHIC STUDIES:  MYOCARDIAL PERFUSION IMAGING  Result Date: 03/29/2021   The study is normal. The study is low risk.   No ST deviation was noted.   Left ventricular function is normal. Nuclear stress EF: 68 %. The left ventricular ejection fraction is hyperdynamic (>65%). End  diastolic cavity size is normal.   Prior study available for comparison from 03/19/2020. Normal stress nuclear study with no ischemia or infarction; gated EF 68 with normal wall motion.   IR IMAGING GUIDED PORT INSERTION  Result Date: 03/22/2021 INDICATION: 70 year old female with history of right lung adenocarcinoma requiring central venous access for chemotherapy administration. EXAM: IMPLANTED PORT A CATH PLACEMENT WITH ULTRASOUND AND FLUOROSCOPIC GUIDANCE COMPARISON:  None. MEDICATIONS: None. ANESTHESIA/SEDATION: Moderate (conscious) sedation was employed during this procedure. A total of Versed 3 mg and Fentanyl 100 mcg was administered intravenously. Moderate Sedation Time: 16 minutes. The patient's level of consciousness and vital signs were monitored continuously by radiology nursing throughout the procedure under my direct supervision. CONTRAST:  None FLUOROSCOPY TIME:  0 minutes, 12 seconds (0 mGy) COMPLICATIONS: None immediate. PROCEDURE: The procedure, risks, benefits, and alternatives were explained to the patient. Questions regarding the procedure were encouraged and answered. The patient understands and consents to the procedure. The right neck and chest were prepped with chlorhexidine in a sterile fashion, and a sterile drape was applied covering the operative field. Maximum barrier sterile technique with sterile gowns and gloves were used for the procedure. A timeout was performed prior to the initiation of the procedure. Ultrasound was used to examine the jugular vein which was compressible and free of internal echoes. A skin marker was used to demarcate the planned venotomy and port pocket incision sites. Local anesthesia was provided to these sites and the subcutaneous tunnel track with 1% lidocaine with 1:100,000 epinephrine. A small incision was created at the jugular access site and blunt dissection was performed of the subcutaneous tissues. Under ultrasound guidance, the jugular vein was  accessed with a 21 ga micropuncture needle and an 0.018" wire was inserted to the superior vena cava. Real-time ultrasound guidance was utilized for vascular access including the acquisition of a permanent ultrasound image documenting patency of the accessed vessel. A 5 Fr micopuncture set was then used, through which a 0.035" Rosen wire was passed under fluoroscopic guidance into the inferior vena cava. An 8 Fr dilator was then placed over the wire. A subcutaneous port pocket was then created along the upper chest wall utilizing a combination of sharp and blunt dissection. The pocket was irrigated with sterile saline, packed with gauze, and observed for hemorrhage. A single lumen "ISP" sized power injectable port was chosen for placement. The 8 Fr catheter was tunneled from the port pocket site to the venotomy incision. The port was placed in the pocket. The external catheter was trimmed to appropriate length. The dilator was exchanged for an 8 Fr peel-away sheath under fluoroscopic guidance. The catheter was then placed through the sheath and the sheath was removed. Final catheter positioning was confirmed and documented with a fluoroscopic spot radiograph. The port was accessed with a Huber needle, aspirated, and flushed with heparinized saline. The deep dermal layer of the port pocket incision was closed with interrupted 3-0 Vicryl suture. The skin was opposed with a running subcuticular 4-0 Monocryl suture. Dermabond was then placed over the port pocket and neck  incisions. The patient tolerated the procedure well without immediate post procedural complication. FINDINGS: After catheter placement, the tip lies within the superior cavoatrial junction. The catheter aspirates and flushes normally and is ready for immediate use. IMPRESSION: Successful placement of a power injectable Port-A-Cath via the right internal jugular vein. The catheter is ready for immediate use. Ruthann Cancer, MD [facility] Electronically Signed   By: Ruthann Cancer M.D.   On: 03/22/2021 10:25     ASSESSMENT/PLAN:  This is a very pleasant 70 year old never smoker African-American female diagnosed with stage IV non-small cell lung cancer, adenocarcinoma.  She was positive for an EGFR mutation with deletion in exon 19.  She was diagnosed in July 2019 and presented with right upper lobe lung mass in addition to mediastinal lymphadenopathy as well as bilateral pulmonary nodules and malignant right pleural effusion.   The patient was started on treatment with Tagrisso 80 mg p.o. daily status post 37 months of treatment.   In April 2022, she showed evidence of disease progression with interval progression of the right apical lung mass in addition to progression of mediastinal lymphadenopathy concerning for worsening of her disease. She had repeat Guardant 360 molecular studies which did not show evidence of new resistant mutations.  Therefore, she was referred to radiation oncology and completed palliative radiotherapy to the enlarging right upper lobe lung mass and mediastinum under the care of Dr. Sondra Come.  This was completed in July 2022.  Unfortunately, the patient recently was found to have evidence of disease progression.  Therefore, Dr. Julien Nordmann started the patient on systemic chemotherapy with carboplatin for AUC of 5 and Alimta 500 mg per metered squared.  She is status post 1 cycle and she tolerated it fairly well despite her ongoing issues with fatigue, chest tightness, cough, and weight loss.  She is not a good candidate for Avastin due to her recent CVA.  She also is continuing to take Tagrisso as it is protective against progressive metastatic disease to the brain.  The patient was seen with Dr. Julien Nordmann today.  Dr. Julien Nordmann gave the patient several options today including consideration of palliative care/hospice care vs continuing on the same treatment and arranging for  restaging CT scan after this cycle of treatment vs. referring the patient to another facility for consideration of a second opinion.  The patient is interested in proceeding with treatment today as scheduled as well as being referred to another institution for second opinion.  I will place a referral to Dr. Durenda Hurt at the [facility] for second opinion.  In the meantime, I will arrange for restaging CT scan of the chest before her next cycle of treatment to restage her disease.  Regarding the decision to hold Lovenox due to her oozing/bleeding from her Port-A-Cath, the patient is expected to see a neurologist next week.  Dr. Julien Nordmann recommends that given her recent stroke, that the patient discuss whether it is okay to hold Lovenox intermittently due to bleeding with her neurologist at her upcoming appointment.  The bleeding from her Port-A-Cath is not fresh blood. Recommend that she follow the instructions given by IR.  If it continues to bleed, we may need to get her back to IR again to reevaluate her.  No significant drop in her hemoglobin compared to last week.  The contrary, her hemoglobin has improved from 10 to 11 this week.   She will continue to take Marinol for her decreased appetite.  She is  expected to see the nutritionist at her next appointment.   Her cough she will continue to use Robitussin.  Dr. Julien Nordmann does not feel that she is a good candidate for thoracentesis given the loculated effusion.  She will use her nebulizers as prescribed.  The patient is scheduled to see Dr. Valeta Harms, from pulmonology, for consideration of bronchoscopy on 04/21/2021 for rebiopsy to send for molecular studies to see if she developed any now actionable mutations.  Dr. Julien Nordmann discussed that if she feels well enough and if Dr. Valeta Harms feels she is okay to proceed given her current status that she proceed with the biopsy as planned.  They have given her instructions to hold her blood thinner prior to this  procedure.  Her potassium is a little low today.  She was encouraged to increase her intake of potassium rich food.  She will receive a handout of potassium rich diet on her after visit summary today.  We will proceed with cycle #2 today scheduled  We will see her back for follow-up visit in 3 weeks for evaluation before starting cycle #3.  The patient was advised to call immediately if she has any concerning symptoms in the interval. The patient voices understanding of current disease status and treatment options and is in agreement with the current care plan. All questions were answered. The patient knows to call the clinic with any problems, questions or concerns. We can certainly see the patient much sooner if necessary       Orders Placed This Encounter  Procedures   CT Chest W Contrast    Standing Status:   Future    Standing Expiration Date:   04/07/2022    Order Specific Question:   If indicated for the ordered procedure, I authorize the administration of contrast media per Radiology protocol    Answer:   Yes    Order Specific Question:   Preferred imaging location?    Answer:   [facility]   Ambulatory referral to Hematology / Oncology    Referral Priority:   Routine    Referral Type:   Consultation    Referral Reason:   Specialty Services Required    Requested Specialty:   Oncology    Number of Visits Requested:   [place], PA-C 04/07/21   ADDENDUM: Hematology/Oncology Attending: I had a face-to-face encounter with the patient.  I reviewed her records, lab and recommended her care plan.  This is a very pleasant 70 years old African-American female diagnosed with a stage IV non-small cell lung cancer, adenocarcinoma with positive EGFR mutation with deletion in exon 33 in July 2019.  She is started treatment with Tagrisso since that time and has been tolerating her treatment fairly well with no concerning adverse  effects. Unfortunately recently the patient was found to have evidence for disease progression with enlargement of right upper lobe lung mass as well as mediastinal adenopathy. She underwent a course of palliative radiotherapy to these areas under the care of Dr. Sondra Come. She is currently undergoing systemic chemotherapy with carboplatin and Alimta status post 1 cycle.  She tolerated the first cycle of her treatment well except for increasing fatigue and weakness but she also has the baseline shortness of breath that has been getting worse recently.  She is also on anticoagulation with Eliquis for recent diagnosis of a stroke and she has some bleeding issues especially around the Port-A-Cath site. She was seen by interventional radiology  and has cauterization of that area. I had a lengthy discussion with the patient about her condition and treatment options.  I discussed with the patient again the option of palliative care on hospice referral versus continuation of her treatment and repeat imaging studies after this cycle to see her response to the treatment.  She was also advised to continue on Tagrisso at the same time of the chemotherapy. I will also refer the patient to Dr. Durenda Hurt at [facility] to see if he has any other clinical trials available for this patient. I will see her back for follow-up visit in 3 weeks for evaluation and discussion of her treatment options based on the upcoming imaging studies. The patient was advised to call immediately if she has any other concerning symptoms in the interval. Disclaimer: This note was dictated with voice recognition software. Similar sounding words can inadvertently be transcribed and may be missed upon review.  Eilleen Kempf, MD 04/08/21

## 2021-03-31 ENCOUNTER — Inpatient Hospital Stay: Payer: Medicare HMO

## 2021-03-31 ENCOUNTER — Inpatient Hospital Stay: Payer: Medicare HMO | Admitting: Nutrition

## 2021-03-31 ENCOUNTER — Telehealth: Payer: Self-pay | Admitting: Medical Oncology

## 2021-03-31 ENCOUNTER — Other Ambulatory Visit: Payer: Self-pay

## 2021-03-31 ENCOUNTER — Ambulatory Visit: Payer: Medicare HMO

## 2021-03-31 ENCOUNTER — Telehealth (INDEPENDENT_AMBULATORY_CARE_PROVIDER_SITE_OTHER): Payer: Medicare HMO | Admitting: Pulmonary Disease

## 2021-03-31 ENCOUNTER — Other Ambulatory Visit: Payer: Medicare HMO

## 2021-03-31 DIAGNOSIS — C3411 Malignant neoplasm of upper lobe, right bronchus or lung: Secondary | ICD-10-CM | POA: Diagnosis not present

## 2021-03-31 DIAGNOSIS — R0602 Shortness of breath: Secondary | ICD-10-CM | POA: Diagnosis not present

## 2021-03-31 DIAGNOSIS — C3491 Malignant neoplasm of unspecified part of right bronchus or lung: Secondary | ICD-10-CM

## 2021-03-31 DIAGNOSIS — Z5111 Encounter for antineoplastic chemotherapy: Secondary | ICD-10-CM | POA: Diagnosis not present

## 2021-03-31 DIAGNOSIS — K5900 Constipation, unspecified: Secondary | ICD-10-CM | POA: Diagnosis not present

## 2021-03-31 DIAGNOSIS — R911 Solitary pulmonary nodule: Secondary | ICD-10-CM | POA: Diagnosis not present

## 2021-03-31 DIAGNOSIS — C771 Secondary and unspecified malignant neoplasm of intrathoracic lymph nodes: Secondary | ICD-10-CM | POA: Diagnosis not present

## 2021-03-31 DIAGNOSIS — C7802 Secondary malignant neoplasm of left lung: Secondary | ICD-10-CM | POA: Diagnosis not present

## 2021-03-31 DIAGNOSIS — R634 Abnormal weight loss: Secondary | ICD-10-CM | POA: Diagnosis not present

## 2021-03-31 DIAGNOSIS — G893 Neoplasm related pain (acute) (chronic): Secondary | ICD-10-CM | POA: Diagnosis not present

## 2021-03-31 DIAGNOSIS — J910 Malignant pleural effusion: Secondary | ICD-10-CM | POA: Diagnosis not present

## 2021-03-31 LAB — CMP (CANCER CENTER ONLY)
ALT: 68 U/L — ABNORMAL HIGH (ref 0–44)
AST: 41 U/L (ref 15–41)
Albumin: 2.8 g/dL — ABNORMAL LOW (ref 3.5–5.0)
Alkaline Phosphatase: 52 U/L (ref 38–126)
Anion gap: 7 (ref 5–15)
BUN: 15 mg/dL (ref 8–23)
CO2: 31 mmol/L (ref 22–32)
Calcium: 10.2 mg/dL (ref 8.9–10.3)
Chloride: 92 mmol/L — ABNORMAL LOW (ref 98–111)
Creatinine: 1.12 mg/dL — ABNORMAL HIGH (ref 0.44–1.00)
GFR, Estimated: 53 mL/min — ABNORMAL LOW (ref >60)
Glucose, Bld: 124 mg/dL — ABNORMAL HIGH (ref 70–99)
Potassium: 3.2 mmol/L — ABNORMAL LOW (ref 3.5–5.1)
Sodium: 130 mmol/L — ABNORMAL LOW (ref 135–145)
Total Bilirubin: 0.6 mg/dL (ref 0.3–1.2)
Total Protein: 6 g/dL — ABNORMAL LOW (ref 6.5–8.1)

## 2021-03-31 LAB — CBC WITH DIFFERENTIAL (CANCER CENTER ONLY)
Abs Immature Granulocytes: 0.02 10*3/uL (ref 0.00–0.07)
Basophils Absolute: 0 10*3/uL (ref 0.0–0.1)
Basophils Relative: 0 %
Eosinophils Absolute: 0 10*3/uL (ref 0.0–0.5)
Eosinophils Relative: 1 %
HCT: 30.4 % — ABNORMAL LOW (ref 36.0–46.0)
Hemoglobin: 10 g/dL — ABNORMAL LOW (ref 12.0–15.0)
Immature Granulocytes: 1 %
Lymphocytes Relative: 15 %
Lymphs Abs: 0.4 10*3/uL — ABNORMAL LOW (ref 0.7–4.0)
MCH: 26.5 pg (ref 26.0–34.0)
MCHC: 32.9 g/dL (ref 30.0–36.0)
MCV: 80.4 fL (ref 80.0–100.0)
Monocytes Absolute: 0.2 10*3/uL (ref 0.1–1.0)
Monocytes Relative: 10 %
Neutro Abs: 1.8 10*3/uL (ref 1.7–7.7)
Neutrophils Relative %: 73 %
Platelet Count: 170 10*3/uL (ref 150–400)
RBC: 3.78 MIL/uL — ABNORMAL LOW (ref 3.87–5.11)
RDW: 15.9 % — ABNORMAL HIGH (ref 11.5–15.5)
WBC Count: 2.5 10*3/uL — ABNORMAL LOW (ref 4.0–10.5)
nRBC: 0 % (ref 0.0–0.2)

## 2021-03-31 NOTE — Progress Notes (Signed)
Virtual Visit via Video Note  I connected with Allison [name] on 03/31/21 at  4:30 PM EDT by a video enabled telemedicine application and verified that I am speaking with the correct person using two identifiers.  Location: Patient: Home Provider: Office   I discussed the limitations of evaluation and management by telemedicine and the availability of in person appointments. The patient expressed understanding and agreed to proceed.  We started with a video visit and were unable to complete the video visit  History of Present Illness:  This is a 70-year-old female, past medical history of stage IV adenocarcinoma of the lung.  Was initially diagnosed with a pleural effusion in July 2019.  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  Initially had a Pleurx catheter placed.  Had palliative radiotherapy and radiation as well as placement on Tagrisso.  Overall doing well for treatment.  Unfortunately CT imaging revealed progression of disease.  Has started chemotherapy with Dr. Mohammed.  CT scan of the chest on 03/04/2021 was completed.  She had evidence of septal thickening nodularity extensive pleural thickening loculated right effusion concern for lymphangitic spread.  Patient was referred today to discuss bronchoscopy and biopsy.  Some of these lesions have a chance for additional mutations with therapy.  Therefore oncology is requesting a repeat biopsy.  Observations/Objective: Able to talk in complete sentences present with daughter on the phone today.  Assessment and Plan: Stage IV adenocarcinoma of the lung Plan: Discussed risk-benefit alternatives proceed with bronchoscopy. Oncology would like repeat biopsy due to chance of mutations within the tissue. We discussed the risk of bleeding and pneumothorax. She is on Lovenox She will need to hold this a full 24 hours prior to the procedure. Tentative bronchoscopy date Will be 04/21/2021 Orders have been  placed   Follow Up Instructions:   I discussed the assessment and treatment plan with the patient. The patient was provided an opportunity to ask questions and all were answered. The patient agreed with the plan and demonstrated an understanding of the instructions.   The patient was advised to call back or seek an in-person evaluation if the symptoms worsen or if the condition fails to improve as anticipated.  I spent 42 minutes dedicated to the care of this patient on the date of this encounter to include pre-visit review of records, face-to-face time with the patient discussing conditions above, post visit ordering of testing, clinical documentation with the electronic health record, making appropriate referrals as documented, and communicating necessary findings to members of the patients care team.    Bradley L Icard, DO [facility] 03/31/2021 7:07 PM    

## 2021-03-31 NOTE — Progress Notes (Signed)
70 year old female diagnosed with stage IV non-small cell lung cancer status postradiation therapy now receiving [place], [place], Tagrisso.  She is followed by Dr. Julien Nordmann.  Past medical history includes CVA, hypertension, anxiety, asthma, depression, GERD, prediabetes, Raynaud's disease.  Medications include Marinol, Xanax, B complex vitamin, Nexium, Folvite, magnesium oxide, prednisone, Compazine, and Carafate.  Labs include glucose 105, BUN 26, creatinine 1.08, albumin 2.8 on October 6.  Height: 5 feet 4 inches. Weight: 122 pounds October 11. Usual body weight: 130 pounds. BMI: 20.94.  Patient is familiar from previous visits.  She complains today of continuing weight loss, no appetite, and early satiety.  She denies nausea and vomiting.  She reports taste alterations.  She is concerned about drinking Ensure because she is lactose intolerant.  She feels very weak and has no energy.  She states she has not started taking the appetite stimulant that the doctor recommended because she is concerned regarding the side effects.  Nutrition diagnosis: Unintended weight loss related to non-small cell lung cancer and associated treatments as evidenced by 6% weight loss over 5 weeks which is significant.  Intervention: Educated patient on strategies for beginning small amounts of food frequently throughout the day.  Provided a list of food choices that patient should try to incorporate at mealtimes.  Educated increased oral intake can help with appetite but also encourage patient to take appetite stimulant prescribed by MD.  She should contact MD if she has questions or concerns about this. Educated patient regarding taste alterations and encouraged baking soda and salt water rinses.  Provided fact sheet with recipe. Recommended patient begin Ensure Enlive or Ensure complete.  Provided samples.  Encouraged her to sip slowly throughout the day and gradually increase to 2-3 times daily. Questions were  answered and teach back method used.  Contact information provided.  Monitoring, evaluation, goals: Patient will tolerate increased calories and protein to minimize further weight loss.  Next visit: Thursday, November 10 during infusion.  **Disclaimer: This note was dictated with voice recognition software. Similar sounding words can inadvertently be transcribed and this note may contain transcription errors which may not have been corrected upon publication of note.**

## 2021-03-31 NOTE — Telephone Encounter (Signed)
Port - Problem- I was called to lobby to check her port , pt said "it is black". Last week she had the incision cauterized in IR due to bleeding at the incision at home.  Today ,her port looks good . The port acces site is intact  She has a black scab over the incision and glue over that. It is without redness, warmth , drainage and pt denies pain. I told her the site is ok and has a scab.

## 2021-04-01 ENCOUNTER — Other Ambulatory Visit: Payer: Self-pay | Admitting: Medical Oncology

## 2021-04-01 ENCOUNTER — Encounter: Payer: Self-pay | Admitting: Internal Medicine

## 2021-04-01 ENCOUNTER — Other Ambulatory Visit: Payer: Self-pay | Admitting: Radiology

## 2021-04-01 ENCOUNTER — Telehealth: Payer: Self-pay | Admitting: Medical Oncology

## 2021-04-01 DIAGNOSIS — R911 Solitary pulmonary nodule: Secondary | ICD-10-CM | POA: Insufficient documentation

## 2021-04-01 NOTE — Telephone Encounter (Signed)
-----   Message from Curt Bears, MD sent at 03/31/2021  4:14 PM EDT ----- Please encourage her to increase her potassium rich diet.  Thank you. ----- Message ----- From: Buel Ream, Lab In [place] Sent: 03/31/2021  11:22 AM EDT To: Curt Bears, MD

## 2021-04-01 NOTE — Progress Notes (Signed)
Patient ID: Allison [name], female   DOB: 19-Nov-1950, 70 y.o.   MRN: [id number] Patient was seen by Dr. Laurence Ferrari on 10/5 secondary to bleeding from Port-A-Cath incision site.  Patient underwent removal of existing Dermabond, cleansing of incision site followed by silver nitrate to site of mild oozing medial aspect of wound.  New Dermabond was applied.  Patient seen again by IR staff on 10/14 due to some persistent oozing from Port-A-Cath site onto patient's clothing.  Patient was examined by Dr. Anselm Pancoast.  On exam patient had no evidence of active bleeding.  Site appeared stable with no signs of active infection.  There appears to be a small hematoma at site with some ecchymosis.  Dr. Anselm Pancoast recommended that patient continue applying gauze dressing over site to prevent any persistent leakage onto clothing for the next 1 to 2 weeks.Pt will continue to monitor situation closely and contact us with any additional concerns.

## 2021-04-01 NOTE — Progress Notes (Signed)
New Port incision site oozing blood again.  It is worse at and today and " won' t stop oozing"  Last week the incision was cauterized because of bleeding the day after port inserted.  Pt on  Lovenox since August for L DVT in several veins.   Pictures today show the scab was dry and intact with minimal swelling under scab.  IR is calling the pt today .  Should she stop Lovenox?  Per Dr Alen Blew ,pt should continue Lovenox.

## 2021-04-01 NOTE — Telephone Encounter (Signed)
Reviewed with patient and or family a list of K+ rich foods to increase in diet including: bananas,oranges, avocado, squash, raisins, potatoes, cabbage,cauliflower, tuna,honey,beef,chicken,sardines,milk,turkey, Patient and or family voice understanding.

## 2021-04-04 ENCOUNTER — Ambulatory Visit: Payer: Medicare HMO

## 2021-04-05 DIAGNOSIS — K219 Gastro-esophageal reflux disease without esophagitis: Secondary | ICD-10-CM | POA: Diagnosis not present

## 2021-04-05 DIAGNOSIS — R0789 Other chest pain: Secondary | ICD-10-CM | POA: Diagnosis not present

## 2021-04-05 DIAGNOSIS — R0989 Other specified symptoms and signs involving the circulatory and respiratory systems: Secondary | ICD-10-CM | POA: Diagnosis not present

## 2021-04-05 DIAGNOSIS — C3491 Malignant neoplasm of unspecified part of right bronchus or lung: Secondary | ICD-10-CM | POA: Diagnosis not present

## 2021-04-06 ENCOUNTER — Ambulatory Visit: Payer: Medicare HMO | Admitting: Physical Therapy

## 2021-04-07 ENCOUNTER — Other Ambulatory Visit: Payer: Medicare HMO

## 2021-04-07 ENCOUNTER — Other Ambulatory Visit: Payer: Self-pay

## 2021-04-07 ENCOUNTER — Inpatient Hospital Stay: Payer: Medicare HMO

## 2021-04-07 ENCOUNTER — Encounter: Payer: Self-pay | Admitting: Internal Medicine

## 2021-04-07 ENCOUNTER — Inpatient Hospital Stay (HOSPITAL_BASED_OUTPATIENT_CLINIC_OR_DEPARTMENT_OTHER): Payer: Medicare HMO | Admitting: Physician Assistant

## 2021-04-07 VITALS — BP 155/75 | HR 84 | Temp 97.5°F | Resp 19 | Ht 64.0 in | Wt 118.4 lb

## 2021-04-07 DIAGNOSIS — C3411 Malignant neoplasm of upper lobe, right bronchus or lung: Secondary | ICD-10-CM | POA: Diagnosis not present

## 2021-04-07 DIAGNOSIS — J90 Pleural effusion, not elsewhere classified: Secondary | ICD-10-CM

## 2021-04-07 DIAGNOSIS — Z7189 Other specified counseling: Secondary | ICD-10-CM

## 2021-04-07 DIAGNOSIS — K5900 Constipation, unspecified: Secondary | ICD-10-CM | POA: Diagnosis not present

## 2021-04-07 DIAGNOSIS — J910 Malignant pleural effusion: Secondary | ICD-10-CM | POA: Diagnosis not present

## 2021-04-07 DIAGNOSIS — C3491 Malignant neoplasm of unspecified part of right bronchus or lung: Secondary | ICD-10-CM

## 2021-04-07 DIAGNOSIS — G893 Neoplasm related pain (acute) (chronic): Secondary | ICD-10-CM | POA: Diagnosis not present

## 2021-04-07 DIAGNOSIS — R0602 Shortness of breath: Secondary | ICD-10-CM | POA: Diagnosis not present

## 2021-04-07 DIAGNOSIS — C7802 Secondary malignant neoplasm of left lung: Secondary | ICD-10-CM | POA: Diagnosis not present

## 2021-04-07 DIAGNOSIS — R634 Abnormal weight loss: Secondary | ICD-10-CM | POA: Diagnosis not present

## 2021-04-07 DIAGNOSIS — Z5111 Encounter for antineoplastic chemotherapy: Secondary | ICD-10-CM | POA: Diagnosis not present

## 2021-04-07 DIAGNOSIS — C771 Secondary and unspecified malignant neoplasm of intrathoracic lymph nodes: Secondary | ICD-10-CM | POA: Diagnosis not present

## 2021-04-07 LAB — CMP (CANCER CENTER ONLY)
ALT: 32 U/L (ref 0–44)
AST: 34 U/L (ref 15–41)
Albumin: 2.4 g/dL — ABNORMAL LOW (ref 3.5–5.0)
Alkaline Phosphatase: 70 U/L (ref 38–126)
Anion gap: 12 (ref 5–15)
BUN: 14 mg/dL (ref 8–23)
CO2: 26 mmol/L (ref 22–32)
Calcium: 11.1 mg/dL — ABNORMAL HIGH (ref 8.9–10.3)
Chloride: 98 mmol/L (ref 98–111)
Creatinine: 0.99 mg/dL (ref 0.44–1.00)
GFR, Estimated: >60 mL/min (ref >60)
Glucose, Bld: 115 mg/dL — ABNORMAL HIGH (ref 70–99)
Potassium: 3.3 mmol/L — ABNORMAL LOW (ref 3.5–5.1)
Sodium: 136 mmol/L (ref 135–145)
Total Bilirubin: <0.2 mg/dL — ABNORMAL LOW (ref 0.3–1.2)
Total Protein: 7 g/dL (ref 6.5–8.1)

## 2021-04-07 LAB — CBC WITH DIFFERENTIAL (CANCER CENTER ONLY)
Abs Immature Granulocytes: 0.05 10*3/uL (ref 0.00–0.07)
Basophils Absolute: 0 10*3/uL (ref 0.0–0.1)
Basophils Relative: 1 %
Eosinophils Absolute: 0 10*3/uL (ref 0.0–0.5)
Eosinophils Relative: 1 %
HCT: 34.4 % — ABNORMAL LOW (ref 36.0–46.0)
Hemoglobin: 11 g/dL — ABNORMAL LOW (ref 12.0–15.0)
Immature Granulocytes: 1 %
Lymphocytes Relative: 14 %
Lymphs Abs: 0.6 10*3/uL — ABNORMAL LOW (ref 0.7–4.0)
MCH: 26.3 pg (ref 26.0–34.0)
MCHC: 32 g/dL (ref 30.0–36.0)
MCV: 82.3 fL (ref 80.0–100.0)
Monocytes Absolute: 0.6 10*3/uL (ref 0.1–1.0)
Monocytes Relative: 16 %
Neutro Abs: 2.7 10*3/uL (ref 1.7–7.7)
Neutrophils Relative %: 67 %
Platelet Count: 444 10*3/uL — ABNORMAL HIGH (ref 150–400)
RBC: 4.18 MIL/uL (ref 3.87–5.11)
RDW: 17 % — ABNORMAL HIGH (ref 11.5–15.5)
WBC Count: 4.1 10*3/uL (ref 4.0–10.5)
nRBC: 0 % (ref 0.0–0.2)

## 2021-04-07 MED ORDER — SODIUM CHLORIDE 0.9 % IV SOLN: Freq: Once | INTRAVENOUS | Status: AC

## 2021-04-07 MED ORDER — SODIUM CHLORIDE 0.9 % IV SOLN
10.0000 mg | Freq: Once | INTRAVENOUS | Status: AC
  Administered 2021-04-07: 10 mg via INTRAVENOUS
  Filled 2021-04-07: qty 10

## 2021-04-07 MED ORDER — SODIUM CHLORIDE 0.9 % IV SOLN
360.0000 mg | Freq: Once | INTRAVENOUS | Status: AC
  Administered 2021-04-07: 360 mg via INTRAVENOUS
  Filled 2021-04-07: qty 36

## 2021-04-07 MED ORDER — SODIUM CHLORIDE 0.9 % IV SOLN
500.0000 mg/m2 | Freq: Once | INTRAVENOUS | Status: AC
  Administered 2021-04-07: 800 mg via INTRAVENOUS
  Filled 2021-04-07: qty 20

## 2021-04-07 MED ORDER — PALONOSETRON HCL INJECTION 0.25 MG/5ML
0.2500 mg | Freq: Once | INTRAVENOUS | Status: AC
  Administered 2021-04-07: 0.25 mg via INTRAVENOUS
  Filled 2021-04-07: qty 5

## 2021-04-07 MED ORDER — SODIUM CHLORIDE 0.9% FLUSH: 10.0000 mL | Freq: Once | INTRAVENOUS | Status: DC

## 2021-04-07 MED ORDER — SODIUM CHLORIDE 0.9 % IV SOLN
150.0000 mg | Freq: Once | INTRAVENOUS | Status: AC
  Administered 2021-04-07: 150 mg via INTRAVENOUS
  Filled 2021-04-07: qty 150

## 2021-04-07 NOTE — Progress Notes (Signed)
Patients port was activly bleeding dark blood and her port was not accessed

## 2021-04-07 NOTE — Patient Instructions (Signed)
[facility]  Discharge Instructions: Thank you for choosing [facility] to provide your oncology and hematology care.   If you have a lab appointment with the [facility], please go directly to the [facility] and check in at the registration area.   Wear comfortable clothing and clothing appropriate for easy access to any Portacath or PICC line.   We strive to give you quality time with your provider. You may need to reschedule your appointment if you arrive late (15 or more minutes).  Arriving late affects you and other patients whose appointments are after yours.  Also, if you miss three or more appointments without notifying the office, you may be dismissed from the clinic at the provider's discretion.      For prescription refill requests, have your pharmacy contact our office and allow 72 hours for refills to be completed.    Today you received the following chemotherapy and/or immunotherapy agents pemetrexed, carboplatin      To help prevent nausea and vomiting after your treatment, we encourage you to take your nausea medication as directed.  BELOW ARE SYMPTOMS THAT SHOULD BE REPORTED IMMEDIATELY: *FEVER GREATER THAN 100.4 F (38 C) OR HIGHER *CHILLS OR SWEATING *NAUSEA AND VOMITING THAT IS NOT CONTROLLED WITH YOUR NAUSEA MEDICATION *UNUSUAL SHORTNESS OF BREATH *UNUSUAL BRUISING OR BLEEDING *URINARY PROBLEMS (pain or burning when urinating, or frequent urination) *BOWEL PROBLEMS (unusual diarrhea, constipation, pain near the anus) TENDERNESS IN MOUTH AND THROAT WITH OR WITHOUT PRESENCE OF ULCERS (sore throat, sores in mouth, or a toothache) UNUSUAL RASH, SWELLING OR PAIN  UNUSUAL VAGINAL DISCHARGE OR ITCHING   Items with * indicate a potential emergency and should be followed up as soon as possible or go to the Emergency Department if any problems should occur.  Please show the CHEMOTHERAPY ALERT CARD or IMMUNOTHERAPY ALERT CARD at  check-in to the Emergency Department and triage nurse.  Should you have questions after your visit or need to cancel or reschedule your appointment, please contact [facility]  Dept: [phone]  and follow the prompts.  Office hours are 8:00 a.m. to 4:30 p.m. Monday - Friday. Please note that voicemails left after 4:00 p.m. may not be returned until the following business day.  We are closed weekends and major holidays. You have access to a nurse at all times for urgent questions. Please call the main number to the clinic Dept: [phone] and follow the prompts.   For any non-urgent questions, you may also contact your provider using MyChart. We now offer e-Visits for anyone 70 and older to request care online for non-urgent symptoms. For details visit [email or web address].   Also download the MyChart app! Go to the app store, search "MyChart", open the app, select [facility], and log in with your MyChart username and password.  Due to Covid, a mask is required upon entering the hospital/clinic. If you do not have a mask, one will be given to you upon arrival. For doctor visits, patients may have 1 support person aged 70 or older with them. For treatment visits, patients cannot have anyone with them due to current Covid guidelines and our immunocompromised population.

## 2021-04-08 ENCOUNTER — Encounter: Payer: Self-pay | Admitting: Internal Medicine

## 2021-04-08 ENCOUNTER — Telehealth: Payer: Self-pay | Admitting: Pulmonary Disease

## 2021-04-08 ENCOUNTER — Encounter: Payer: Self-pay | Admitting: Pulmonary Disease

## 2021-04-08 NOTE — Telephone Encounter (Signed)
I sched pt for ENB on 11/3 at 7:30 at [facility].  She will go for covid test on 10/31.  [facility] has already sent disk to [facility].  Pt has been told to stop Lovenox the day before and she is concerned because she states port is still oozing.  She would like to speak to a nurse.

## 2021-04-12 ENCOUNTER — Ambulatory Visit (INDEPENDENT_AMBULATORY_CARE_PROVIDER_SITE_OTHER): Payer: Medicare HMO | Admitting: Adult Health

## 2021-04-12 ENCOUNTER — Other Ambulatory Visit: Payer: Self-pay

## 2021-04-12 ENCOUNTER — Telehealth: Payer: Self-pay

## 2021-04-12 ENCOUNTER — Encounter: Payer: Self-pay | Admitting: Adult Health

## 2021-04-12 VITALS — BP 148/86 | HR 90 | Ht 64.0 in | Wt 118.0 lb

## 2021-04-12 DIAGNOSIS — I10 Essential (primary) hypertension: Secondary | ICD-10-CM

## 2021-04-12 DIAGNOSIS — E785 Hyperlipidemia, unspecified: Secondary | ICD-10-CM | POA: Diagnosis not present

## 2021-04-12 DIAGNOSIS — C3491 Malignant neoplasm of unspecified part of right bronchus or lung: Secondary | ICD-10-CM | POA: Diagnosis not present

## 2021-04-12 DIAGNOSIS — D6859 Other primary thrombophilia: Secondary | ICD-10-CM

## 2021-04-12 DIAGNOSIS — I639 Cerebral infarction, unspecified: Secondary | ICD-10-CM | POA: Diagnosis not present

## 2021-04-12 NOTE — Progress Notes (Signed)
[facility] [address] [place]. [place] [address] [phone]       HOSPITAL FOLLOW UP NOTE  Ms. Allison [name] Date of Birth:  [birth year] Medical Record Number:  [id number]   Reason for Referral:  hospital stroke follow up    SUBJECTIVE:   CHIEF COMPLAINT:  Chief Complaint  Patient presents with   Follow-up    RM 3 with  friend shelton  Pt is well, has been having shortness of breath, chest tightness, blurred vision, fatigue and lost of appetite. No other stroke related symptoms.    HPI:   Ms. Allison [name] is a 70 y.o. female  with a past medical history significant for stage IV adenocarcinoma of the right lung (undergoing chemotherapy and radiation), hypercoagulable state secondary to malignancy (recent DVT and PE on Eliquis), hypertension, prediabetes, and anxiety/depression.  Patient presented on 02/11/2021 for left-sided weakness and double vision.  Personally reviewed hospitalization pertinent progress notes, lab work and imaging.  Evaluated by Dr. Jaynee Eagles.  Stroke work-up revealed bilateral cerebral and cerebellar infarcts, embolic likely due to hypercoagulable state in stage IV lung cancer.  CTA head/neck left VA occlusion indeterminate possibly reflect changes of underlying dissection per report and 77mm outpouching arising from the cavernous left ICA possibly small aneurysm vs vascular infundibulum.  Recommend repeating MRI brain in 3-4 weeks to rule out metastasis although lower suspicion.  EF 60 to 65%.  LDL 91.  A1c 5.7.  On eliquis PTA and recommended switching to Lovenox at discharge per oncology recommendations. No prior stroke history.  Therapy eval's recommended outpatient PT/OT.    Today, 04/12/2021, Allison [name] is being seen for hospital follow-up accompanied by her friend, [name].  Overall stable from stroke standpoint without new stroke/TIA symptoms.  Reports residual imbalance and blurred vision with some improvement since discharge. She  was seen by PT but difficulty participating due to generalized weakness and SOB.  Ambulates short distance with a cane and w/c for long distance.  She has been seen by ophthalmologist Dr. Katy Fitch - mentions needed cataract removal.  Her greatest concern today is in regards to ongoing Lovenox use and bleeding from Port-A-Cath site which was placed 10/4. She reports being told by oncology that our office would be managing Lovenox and making further recommendations whether ongoing need is indicated and further recommendations regarding continued bleeding at cath site. She was advised that oncology would be managing her lovenox and should f/u with IR (who placed catheter) for further discussion regarding these concerns - she became frustrated and feels like she just keeps "getting the run around".   She otherwise is tolerating Lovenox without any other bleeding or bruising concerns.  She also remains on Repatha which has been ongoing.  Blood pressure today 148/86.  Routinely followed by oncology. Repeat CT scan showed unchanged treated mass of the right pulmonary apex and slight interval increase in the moderate loculated right pleural effusion with extensive pleural thickening and nodularity as well as septal thickening and nodularity throughout the lungs as well as extensive nodularity and septal thickening of the left lung worse compared to prior exam -findings consistent with worsened pleural and lymphangitic metastatic disease.  Systemic chemotherapy initiated on 9/29. She is scheduled to undergo bronchoscopy 11/3 routinely followed by pulmonology. Recent visit with oncology provided her with several options including consideration of palliative care/hospice care vs continuing on same treatment vs referring patient to another facility for consideration of second opinion.  She opted to continue current treatment plan as  well as being referred to [facility] for second opinion.  Recently followed by  pulmonology and cardiology    PERTINENT IMAGING  MR BRAIN W WO CONTRAST 02/11/2021 IMPRESSION: 1. Patchy multifocal acute to subacute ischemic infarcts involving the bilateral cerebral and cerebellar hemispheres. Associated small volume petechial blood products at the right cerebellum without frank hemorrhagic transformation. No significant mass effect. 2. Associated scattered areas of patchy enhancement as above, favored to be secondary to subacute ischemic changes. A short interval follow-up MRI to ensure these changes resolve is recommended, particularly given the patient history of stage IV lung cancer and to ensure no underlying metastatic disease is present. 3. Abnormal flow void within the left vertebral artery, which could be related to slow flow and/or occlusion. 4. Underlying chronic microvascular ischemic disease.  02/27/2021 IMPRESSION: This MRI of the brain with and without contrast shows the following: 1.   Multiple T2/FLAIR hyperintense foci in the hemispheres, some of which have restricted diffusion consistent with subacute infarctions, likely embolic.  Compared to the 02/11/2021 MRI, most of these have evolved and are less hyperintense.  There do not appear to be any new foci on diffusion-weighted images. 2.   Some small foci in the left occipital lobe, left posterior frontal lobe, right frontal lobe and right cerebellar hemisphere enhance after contrast.  The four foci seen in the right frontal lobe was not present on the 02/11/2021 MRI.  Although most likely related to the recent strokes, the foci in the right frontal lobe do not have corresponding DWI changes and metastasis cannot be ruled out.  Consider follow-up evaluation in several more weeks.  CTA HEAD/NECK 02/12/2021 IMPRESSION: 1. Increasing attenuation within the left vertebral artery as it courses cephalad within the neck, with occlusion at the skull base. Finding is age indeterminate, but could reflect changes  of an underlying dissection. 2. Mild atheromatous change elsewhere about the major arterial vasculature of the head and neck. No other large vessel occlusion. No other proximal high-grade or correctable stenosis. 3. 2 mm outpouching arising from the cavernous left ICA, which could reflect a small aneurysm versus vascular infundibulum. 4. Spiculated right apical mass, consistent with known malignancy. Diffuse pleuroparenchymal thickening about the right lung with scattered interlobular septal thickening and nodularity within the remainder of the visualized lungs, concerning for lymphangitic spread of tumor. Superimposed infection could be considered in the correct clinical setting.  2D ECHO  02/08/2021 IMPRESSIONS   1. Left ventricular ejection fraction, by estimation, is 60 to 65%. The  left ventricle has normal function. The left ventricle has no regional  wall motion abnormalities. Left ventricular diastolic parameters were  normal.   2. Right ventricular systolic function is normal. The right ventricular  size is normal. There is normal pulmonary artery systolic pressure. The  estimated right ventricular systolic pressure is 38.1 mmHg.   3. The mitral valve is normal in structure. Mild to moderate mitral valve  regurgitation. No evidence of mitral stenosis.   4. Tricuspid valve regurgitation is mild to moderate.   5. The aortic valve is normal in structure. Aortic valve regurgitation is  not visualized. No aortic stenosis is present.   6. The inferior vena cava is normal in size with greater than 50%  respiratory variability, suggesting right atrial pressure of 3 mmHg.   02/13/2021 IMPRESSIONS   1. Left ventricular ejection fraction, by estimation, is 60 to 65%. The  left ventricle has normal function. The left ventricle has no regional  wall motion abnormalities.  2. The mitral valve is normal in structure. Mild to moderate mitral valve  regurgitation.      ROS:   14  system review of systems performed and negative with exception of those listed in HPI  PMH:  Past Medical History:  Diagnosis Date   Anxiety    Arthritis    Asthma    exercise induced   Depression    PMH   GERD (gastroesophageal reflux disease)    Glaucoma    History of radiation therapy 01/05/2021   IMRT right lung  11/24/2020-01/05/2021  Dr [name]   Hypertension    lung ca dx'd 11/2017   right   Malignant pleural effusion    right   PONV (postoperative nausea and vomiting)    Pre-diabetes    Raynaud's disease    Raynaud's disease     PSH:  Past Surgical History:  Procedure Laterality Date   ABDOMINAL HYSTERECTOMY     partial   CHEST TUBE INSERTION Right 01/01/2018   Procedure: INSERTION PLEURAL DRAINAGE CATHETER;  Surgeon: Ivin Poot, MD;  Location: [place];  Service: Thoracic;  Laterality: Right;   COLONOSCOPY     CORONARY STENT INTERVENTION N/A 06/16/2019   Procedure: CORONARY STENT INTERVENTION;  Surgeon: Jettie Booze, MD;  Location: [place] [facility];  Service: Cardiovascular;  Laterality: N/A;   DILATION AND CURETTAGE OF UTERUS     EYE SURGERY     due to Glaucoma   IR IMAGING GUIDED PORT INSERTION  03/22/2021   LEFT HEART CATH AND CORONARY ANGIOGRAPHY N/A 06/16/2019   Procedure: LEFT HEART CATH AND CORONARY ANGIOGRAPHY;  Surgeon: Jettie Booze, MD;  Location: [place] [facility];  Service: Cardiovascular;  Laterality: N/A;   REMOVAL OF PLEURAL DRAINAGE CATHETER Right 11/07/2018   Procedure: REMOVAL OF PLEURAL DRAINAGE CATHETER;  Surgeon: Ivin Poot, MD;  Location: [place];  Service: Thoracic;  Laterality: Right;   ROTATOR CUFF REPAIR     TUBAL LIGATION     WISDOM TOOTH EXTRACTION      Social History:  Social History   Socioeconomic History   Marital status: Widowed    Spouse name: Not on file   Number of children: 3   Years of education: Not on file   Highest education level: Not on file  Occupational History    Employer: [employer]  Tobacco Use   Smoking status: Never   Smokeless tobacco: Never  Vaping Use   Vaping Use: Never used  Substance and Sexual Activity   Alcohol use: Not Currently    Comment: up to 3 drinks per week   Drug use: No    Comment: CBD oil    Sexual activity: Not Currently    Comment: Hysterectomy  Other Topics Concern   Not on file  Social History Narrative   Not on file   Social Determinants of Health   Financial Resource Strain: Not on file  Food Insecurity: Not on file  Transportation Needs: Not on file  Physical Activity: Not on file  Stress: Not on file  Social Connections: Not on file  Intimate Partner Violence: Not on file    Family History:  Family History  Problem Relation Age of Onset   Heart disease Sister    Heart disease Brother    Lung cancer Other     Medications:   Current Outpatient Medications on File Prior to Visit  Medication Sig Dispense Refill   acetaminophen (TYLENOL) 500 MG tablet  Take 1,000 mg by mouth every 6 (six) hours as needed (pain).     ALPRAZolam (XANAX) 0.25 MG tablet Take 0.25 mg by mouth 2 (two) times daily as needed for anxiety.      amLODipine (NORVASC) 5 MG tablet Take 5 mg by mouth daily.     aspirin 81 MG chewable tablet Chew 1 tablet (81 mg total) by mouth daily.     b complex vitamins capsule Take 1 capsule by mouth daily.     carvedilol (COREG) 12.5 MG tablet Take 1 tablet (12.5 mg total) by mouth 2 (two) times daily with a meal. 180 tablet 3   cetirizine (ZYRTEC) 5 MG tablet Take 5 mg by mouth daily.     chlorthalidone (HYGROTON) 25 MG tablet Take 1 tablet (25 mg total) by mouth daily. 30 tablet 6   dorzolamide-timolol (COSOPT) 22.3-6.8 MG/ML ophthalmic solution Place 1 drop into both eyes 2 (two) times daily.     dronabinol (MARINOL) 2.5 MG capsule Take 1 capsule (2.5 mg total) by mouth 2 (two) times daily before a meal. 60 capsule 0   enoxaparin (LOVENOX) 60 MG/0.6ML injection Inject 0.6 mLs (60 mg total) into the skin every  12 (twelve) hours. 60 mL 1   esomeprazole (NEXIUM) 20 MG packet Take 20 mg by mouth daily.     ezetimibe (ZETIA) 10 MG tablet Take 10 mg by mouth daily.      folic acid (FOLVITE) 1 MG tablet Take 1 tablet (1 mg total) by mouth daily. 30 tablet 4   guaiFENesin-dextromethorphan (ROBITUSSIN DM) 100-10 MG/5ML syrup Take 10 mLs by mouth 4 (four) times daily. 118 mL 0   HYDROcodone bit-homatropine (HYCODAN) 5-1.5 MG/5ML syrup Take 5 mLs by mouth every 4 (four) hours as needed for cough. 120 mL 0   isosorbide mononitrate (IMDUR) 30 MG 24 hr tablet Take 1 tablet (30 mg total) by mouth daily. 15 tablet 0   latanoprost (XALATAN) 0.005 % ophthalmic solution Place 1 drop into both eyes at bedtime.      lidocaine-prilocaine (EMLA) cream Apply 1 application topically as needed. 30 g 0   magnesium oxide (MAG-OX) 400 MG tablet Take 400 mg by mouth daily. Take 1/2 tablet daily     morphine 20 MG/5ML solution Take 0.6 mLs (2.4 mg total) by mouth every 4 (four) hours as needed (shortness of breath). 30 mL 0   osimertinib mesylate (TAGRISSO) 80 MG tablet Take 80 mg by mouth daily.     OVER THE COUNTER MEDICATION Take 1,200 mLs by mouth daily. Viatmin d take 1223ml daily     prochlorperazine (COMPAZINE) 10 MG tablet Take 1 tablet (10 mg total) by mouth every 6 (six) hours as needed for nausea or vomiting. 30 tablet 0   Pseudoephedrine-Acetaminophen (CEPACOL CHILD SORE THROAT FORM PO) Take 1 lozenge by mouth. Take 1 as needed     REPATHA SURECLICK 427 MG/ML SOAJ Inject 140 mg into the skin every 14 (fourteen) days. 6 mL 3   sucralfate (CARAFATE) 1 g tablet Take 1 tablet (1 g total) by mouth 4 (four) times daily. with meals and at bedtime. Crush and dissolve in 10 mL of warm water prior to swallowing 120 tablet 0   SYMBICORT 80-4.5 MCG/ACT inhaler Inhale 2 puffs into the lungs 2 (two) times daily.     Current Facility-Administered Medications on File Prior to Visit  Medication Dose Route Frequency Provider Last Rate  Last Admin   prochlorperazine (COMPAZINE) 10 MG tablet  Allergies:   Allergies  Allergen Reactions   Penicillins Other (See Comments)    SYNCOPE PATIENT HAS HAD A PCN REACTION WITH IMMEDIATE RASH, FACIAL/TONGUE/THROAT SWELLING, SOB, OR LIGHTHEADEDNESS WITH HYPOTENSION:  #  #  YES  #  # Has patient had a PCN reaction causing severe rash involving mucus membranes or skin necrosis: No Has patient had a PCN reaction that required hospitalization: No Has patient had a PCN reaction occurring within the last 10 years: No    Vicodin [Hydrocodone-Acetaminophen] Other (See Comments)    Sped up heart and breathing   Other Other (See Comments)    Glaucoma eye drop      OBJECTIVE:  Physical Exam  Vitals:   04/12/21 1007  BP: (!) 148/86  Pulse: 90  Weight: 118 lb (53.5 kg)  Height: 5\' 4"  (1.626 m)   Body mass index is 20.25 kg/m. No results found.  General: Frail pleasant elderly African-American female, seated, in no evident distress Head: head normocephalic and atraumatic.   Neck: supple with no carotid or supraclavicular bruits Cardiovascular: regular rate and rhythm, no murmurs Musculoskeletal: no deformity Skin:  right check port cath - covered with gauze and paper tape - per pt request, gauze removed -  No evidence of infection. Area re-dressed with nonadherent gauze and paper tape  Vascular:  Normal pulses all extremities   Neurologic Exam Mental Status: Awake and fully alert.  Fluent speech and language.  Oriented to place and time. Recent and remote memory intact. Attention span, concentration and fund of knowledge appropriate. Mood and affect appropriate.  Cranial Nerves: Fundoscopic exam reveals sharp disc margins. Pupils equal, briskly reactive to light. Extraocular movements full without nystagmus. Visual fields full to confrontation. Hearing intact. Facial sensation intact. Face, tongue, palate moves normally and symmetrically.  Motor: Normal bulk and tone.  Normal strength in all tested extremity muscles Sensory.: intact to touch , pinprick , position and vibratory sensation.  Coordination: Rapid alternating movements normal in all extremities. Finger-to-nose and heel-to-shin performed accurately bilaterally. Gait and Station: Deferred Reflexes: 1+ and symmetric. Toes downgoing.     NIHSS  0 Modified Rankin  2      ASSESSMENT: Allison [name] is a 71 y.o. year old female with recent bilateral cerebral and cerebellar infarcts on 10/13/8339, embolic likely due to hypercoagulable state in stage IV lung cancer. Vascular risk factors include stage IV adenocarcinoma of right lung (undergoing chemo and radiation), hypercoagulable state secondary to malignancy with PE and DVT on Eliquis (dx'd 8/22), HTN, HLD and pre-DM.      PLAN:  Embolic strokes:  Residual deficit: imbalance and blurred vision. Therapies currently on hold due to fatigue and SOB in setting of lung cancer. She will let me know if she wishes to pursue in the future.  She will continue to follow with Dr. Katy Fitch for visual monitoring Continue Lovenox and atorvastatin 40 mg daily for secondary stroke prevention. She was advised with oncology should be managing Lovenox as she is currently on for hypercoagulable state in setting of lung cancer - they will also be the ones to determine ongoing duration Discussed secondary stroke prevention measures and importance of close PCP follow up for aggressive stroke risk factor management. I have gone over the pathophysiology of stroke, warning signs and symptoms, risk factors and their management in some detail with instructions to go to the closest emergency room for symptoms of concern. HTN: BP goal <130/90.  Stable on current regimen per PCP HLD: LDL goal <70.  Recent LDL 91.  Continue atorvastatin 40 mg daily Pre-DMII: A1c goal<7.0. Recent A1c 5.7.  Continue routine monitoring by PCP Stage IV adenocarcinoma of right lung Hypercoagulable  state: Switched from Eliquis to Lovenox per oncology recommendations after recent admission Plan on repeat MR brain w/wo contrast to rule out metastasis due to foci in right frontal lobe did not have corresponding DWI changes and metastasis cannot be ruled out (although per report, most likely related to recent strokes) Routinely followed by oncology Bleeding at port-a-cath site - advised likely in setting of Lovenox use but currently only minimal bleeding noted. She was advised that this may just take longer to heal due to Lovenox use. Unless excessive uncontrollable or life-threatening bleeding noted, I would recommend she continue Lovenox (or anticoagulant) due to increased risk of stroke in setting of hypocoagulable state. She will continue to follow with oncology and IR for monitoring     Follow up in 6 months or call earlier if needed   CC:  [facility] provider: Dr. Jaynee Eagles PCP: [name], MD   Oncology: Dr. Julien Nordmann, Cassandra Heilingoetter, PA IR: Dr. Serafina Royals, Darrell Allred, PA  I spent 62 minutes of face-to-face and non-face-to-face time with patient and friend.  This included previsit chart review including review of recent hospitalization, lab review, study review, order entry, electronic health record documentation, patient and friend education and prolonged discussion regarding recent stroke including etiology, secondary stroke prevention measures and importance of managing stroke risk factors, residual deficits and typical recovery time, ongoing use of Lovenox and bleeding concerns and answered all other questions to patient and friends satisfaction   Frann Rider, AGNP-BC  [facility] [address] [place], [place] [address]  Phone [phone] Fax [phone] Note: This document was prepared with digital dictation and possible smart phrase technology. Any transcriptional errors that result from this process are unintentional.

## 2021-04-12 NOTE — Telephone Encounter (Signed)
Called and lmom that we need to reschedule 04/27/21 appt to different day

## 2021-04-12 NOTE — Patient Instructions (Signed)
You will be called to schedule a repeat MR brain with and without contrast to ensure no metastases  to the brain  Continue lovenox and atorvastatin 40mg  daily  for secondary stroke prevention  Continue to follow up with PCP regarding cholesterol and blood pressure management  Maintain strict control of hypertension with blood pressure goal below 130/90 and cholesterol with LDL cholesterol (bad cholesterol) goal below 70 mg/dL.   Please continue to follow with oncology as scheduled - they will also be the ones managing your lovenox  Please ensure you follow up with IR if you continue to have bleeding from your site but this looks okay today - no evidence of infection or concerns - most likely just taking a little longer to heal due to use of the lovenox       Followup in the future with me in 6 months or call earlier if needed       Thank you for coming to see [place] at [facility]. I hope we have been able to provide you high quality care today.  You may receive a patient satisfaction survey over the next few weeks. We would appreciate your feedback and comments so that we may continue to improve ourselves and the health of our patients.

## 2021-04-13 ENCOUNTER — Telehealth: Payer: Self-pay | Admitting: Adult Health

## 2021-04-13 NOTE — Telephone Encounter (Signed)
[employer] medicare order sent to [facility], they will obtain the auth and reach out to the patient to schedule.

## 2021-04-14 ENCOUNTER — Other Ambulatory Visit: Payer: Self-pay | Admitting: Medical Oncology

## 2021-04-14 ENCOUNTER — Telehealth: Payer: Self-pay | Admitting: Medical Oncology

## 2021-04-14 ENCOUNTER — Inpatient Hospital Stay: Payer: Medicare HMO

## 2021-04-14 ENCOUNTER — Other Ambulatory Visit: Payer: Self-pay

## 2021-04-14 ENCOUNTER — Telehealth: Payer: Self-pay | Admitting: *Deleted

## 2021-04-14 ENCOUNTER — Telehealth: Payer: Self-pay | Admitting: Cardiology

## 2021-04-14 ENCOUNTER — Other Ambulatory Visit: Payer: Medicare HMO

## 2021-04-14 ENCOUNTER — Other Ambulatory Visit: Payer: Self-pay | Admitting: Internal Medicine

## 2021-04-14 DIAGNOSIS — Z95828 Presence of other vascular implants and grafts: Secondary | ICD-10-CM

## 2021-04-14 DIAGNOSIS — R0602 Shortness of breath: Secondary | ICD-10-CM | POA: Diagnosis not present

## 2021-04-14 DIAGNOSIS — C771 Secondary and unspecified malignant neoplasm of intrathoracic lymph nodes: Secondary | ICD-10-CM | POA: Diagnosis not present

## 2021-04-14 DIAGNOSIS — C3411 Malignant neoplasm of upper lobe, right bronchus or lung: Secondary | ICD-10-CM | POA: Diagnosis not present

## 2021-04-14 DIAGNOSIS — K5900 Constipation, unspecified: Secondary | ICD-10-CM | POA: Diagnosis not present

## 2021-04-14 DIAGNOSIS — C3491 Malignant neoplasm of unspecified part of right bronchus or lung: Secondary | ICD-10-CM

## 2021-04-14 DIAGNOSIS — C7802 Secondary malignant neoplasm of left lung: Secondary | ICD-10-CM | POA: Diagnosis not present

## 2021-04-14 DIAGNOSIS — R634 Abnormal weight loss: Secondary | ICD-10-CM | POA: Diagnosis not present

## 2021-04-14 DIAGNOSIS — J910 Malignant pleural effusion: Secondary | ICD-10-CM | POA: Diagnosis not present

## 2021-04-14 DIAGNOSIS — Z5111 Encounter for antineoplastic chemotherapy: Secondary | ICD-10-CM | POA: Diagnosis not present

## 2021-04-14 DIAGNOSIS — G893 Neoplasm related pain (acute) (chronic): Secondary | ICD-10-CM | POA: Diagnosis not present

## 2021-04-14 LAB — CBC WITH DIFFERENTIAL (CANCER CENTER ONLY)
Abs Immature Granulocytes: 0.01 10*3/uL (ref 0.00–0.07)
Basophils Absolute: 0 10*3/uL (ref 0.0–0.1)
Basophils Relative: 0 %
Eosinophils Absolute: 0 10*3/uL (ref 0.0–0.5)
Eosinophils Relative: 3 %
HCT: 29.5 % — ABNORMAL LOW (ref 36.0–46.0)
Hemoglobin: 9.6 g/dL — ABNORMAL LOW (ref 12.0–15.0)
Immature Granulocytes: 1 %
Lymphocytes Relative: 24 %
Lymphs Abs: 0.3 10*3/uL — ABNORMAL LOW (ref 0.7–4.0)
MCH: 26.5 pg (ref 26.0–34.0)
MCHC: 32.5 g/dL (ref 30.0–36.0)
MCV: 81.5 fL (ref 80.0–100.0)
Monocytes Absolute: 0.1 10*3/uL (ref 0.1–1.0)
Monocytes Relative: 10 %
Neutro Abs: 0.7 10*3/uL — ABNORMAL LOW (ref 1.7–7.7)
Neutrophils Relative %: 62 %
Platelet Count: 239 10*3/uL (ref 150–400)
RBC: 3.62 MIL/uL — ABNORMAL LOW (ref 3.87–5.11)
RDW: 15.8 % — ABNORMAL HIGH (ref 11.5–15.5)
WBC Count: 1.2 10*3/uL — ABNORMAL LOW (ref 4.0–10.5)
nRBC: 0 % (ref 0.0–0.2)

## 2021-04-14 LAB — CMP (CANCER CENTER ONLY)
ALT: 56 U/L — ABNORMAL HIGH (ref 0–44)
AST: 37 U/L (ref 15–41)
Albumin: 2.6 g/dL — ABNORMAL LOW (ref 3.5–5.0)
Alkaline Phosphatase: 65 U/L (ref 38–126)
Anion gap: 13 (ref 5–15)
BUN: 15 mg/dL (ref 8–23)
CO2: 25 mmol/L (ref 22–32)
Calcium: 10.2 mg/dL (ref 8.9–10.3)
Chloride: 98 mmol/L (ref 98–111)
Creatinine: 1.01 mg/dL — ABNORMAL HIGH (ref 0.44–1.00)
GFR, Estimated: 60 mL/min — ABNORMAL LOW (ref >60)
Glucose, Bld: 99 mg/dL (ref 70–99)
Potassium: 3.3 mmol/L — ABNORMAL LOW (ref 3.5–5.1)
Sodium: 136 mmol/L (ref 135–145)
Total Bilirubin: 0.4 mg/dL (ref 0.3–1.2)
Total Protein: 7.2 g/dL (ref 6.5–8.1)

## 2021-04-14 MED ORDER — OSIMERTINIB MESYLATE 80 MG PO TABS: 80.0000 mg | ORAL_TABLET | Freq: Every day | ORAL | 3 refills | Status: DC

## 2021-04-14 MED ORDER — ISOSORBIDE MONONITRATE ER 30 MG PO TB24: 30.0000 mg | ORAL_TABLET | Freq: Every day | ORAL | 3 refills | Status: DC

## 2021-04-14 NOTE — Telephone Encounter (Signed)
Rx sent to pharmacy-left message to make patient aware.

## 2021-04-14 NOTE — Telephone Encounter (Signed)
Pt c/o medication issue:  1. Name of Medication: isosorbide mononitrate (IMDUR) 30 MG 24 hr tablet  2. How are you currently taking this medication (dosage and times per day)? 1 tablet daily  3. Are you having a reaction (difficulty breathing--STAT)? no  4. What is your medication issue? Patient states the prescription was changed from half a tablet daily to 1 tablet daily, but her pharmacy has to received the new prescription. She would like the new one sent to [facility] on [address]

## 2021-04-14 NOTE — Telephone Encounter (Signed)
Pt was directed to [facility] to evaluate port site. Pt port site has blood oozing from site where incision is. Pt stated that her neurologist observed the site and suggested that she be referred to the wound clinic.

## 2021-04-14 NOTE — Telephone Encounter (Addendum)
Pt here for port flush with labs.  Labs reviewed with pt and instructed her on neutropenic precautions.  Per nurse, "port site has blood oozing from site where incision is. Pt stated that her neurologist observed the site and suggested that she be referred to the wound clinic"   Per Julien Nordmann, order placed for IR evaluation and repair or port removal which pt is agreeable .    Lovenox pt assistance. Pt is in contact with Dr [name] to prescribe refills of Lovenox via pt assistance.  I also called Dr Roland Earl office and left message regarding pts request for pt assistance for lovenox.

## 2021-04-15 ENCOUNTER — Ambulatory Visit (HOSPITAL_COMMUNITY)
Admission: RE | Admit: 2021-04-15 | Discharge: 2021-04-15 | Disposition: A | Discharge status: Home / Self Care | Payer: Medicare HMO | Source: Ambulatory Visit | Attending: Internal Medicine | Admitting: Internal Medicine

## 2021-04-15 DIAGNOSIS — T827XXA Infection and inflammatory reaction due to other cardiac and vascular devices, implants and grafts, initial encounter: Secondary | ICD-10-CM | POA: Diagnosis not present

## 2021-04-15 DIAGNOSIS — Z452 Encounter for adjustment and management of vascular access device: Secondary | ICD-10-CM | POA: Insufficient documentation

## 2021-04-15 DIAGNOSIS — Z95828 Presence of other vascular implants and grafts: Secondary | ICD-10-CM

## 2021-04-15 HISTORY — PX: IR PORT REPAIR CENTRAL VENOUS ACCESS DEVICE: IMG5775

## 2021-04-15 MED ORDER — LIDOCAINE-EPINEPHRINE 1 %-1:100000 IJ SOLN
INTRAMUSCULAR | Status: DC | PRN
  Administered 2021-04-15: 20 mL via INTRADERMAL

## 2021-04-15 MED ORDER — LIDOCAINE-EPINEPHRINE 1 %-1:100000 IJ SOLN
INTRAMUSCULAR | Status: AC
  Filled 2021-04-15: qty 1

## 2021-04-15 NOTE — Procedures (Signed)
Interventional Radiology Procedure Note  Procedure: Port incision revision  Findings: Focal area of non-apposition about the medial aspect of port incision.  No evidence of current hemorrhage, but discharge of old hematoma through incisional dehiscence.    The port was prepped and draped and 1% lido with epinephrine was administered.  Ellipsoid incision was made to exclude prior port incision.  The pocket appeared clean and was irrigated copiously with saline.  Interrupted deep dermal 3-0 vicryl sutures were placed followed by a knotless running subcuticular 4-0 monocryle and two horizontal mattress 2-0 ethilon buttressing sutures.  Dermabond and non-adhesive dressing was placed.  Complications: None immediate  Estimated Blood Loss: < 5 mL  Recommendations: Keep clean and dry.  Change dressing as needed if bleeding or soaking through occurs.  Follow up with me in 2 weeks for wound check and suture removal.  This can be at [facility] or IR clinic.  The port is available for use immediately as needed.   Ruthann Cancer, MD Pager: [phone]

## 2021-04-18 ENCOUNTER — Other Ambulatory Visit: Payer: Self-pay | Admitting: Pulmonary Disease

## 2021-04-19 ENCOUNTER — Encounter (HOSPITAL_COMMUNITY): Payer: Self-pay | Admitting: Pulmonary Disease

## 2021-04-19 ENCOUNTER — Other Ambulatory Visit: Payer: Self-pay

## 2021-04-19 LAB — SARS CORONAVIRUS 2 (TAT 6-24 HRS): SARS Coronavirus 2: NEGATIVE

## 2021-04-19 NOTE — Progress Notes (Addendum)
Ms Allison [name] states she has chest pain frequently, patient states it is from her lungs and all of the Drs are aware. Ms Allison [name] takes Tylenol; for chest pain.  Ms Allison [name] gets short of breath when she is talking or when she is moving around.  Ms Allison [name] states that if she sits perfectly still she doe snot have shortness of breath or chest pain.  PCP is Dr.Kimberly [name]. Cardiologist is Dr. Gardiner Rhyme.  Ms Allison [name]'s hemoglobin was 9.6 on 04/14/21, down from 04/07/21.  Patient states that she will have repeated tomorrow at the [facility].  I instructed patient to shower with antibiotic soap, if it is available.  Dry off with a clean towel. Do not put lotion, powder, cologne or deodorant or makeup.No jewelry or piercings. Men may shave their face and neck. Woman should not shave. No nail polish, artificial or acrylic nails. Wear clean clothes, brush your teeth. Glasses, contact lens,dentures or partials may not be worn in the OR. If you need to wear them, please bring a case for glasses, do not wear contacts or bring a case, the hospital does not have contact cases, dentures or partials will have to be removed , make sure they are clean, we will provide a denture cup to put them in. You will need some one to drive you home and a responsible person over the age of 18 to stay with you for the first 24 hours after surgery.

## 2021-04-20 ENCOUNTER — Other Ambulatory Visit: Payer: Medicare HMO

## 2021-04-20 ENCOUNTER — Inpatient Hospital Stay: Payer: Medicare HMO | Attending: Internal Medicine

## 2021-04-20 DIAGNOSIS — C3411 Malignant neoplasm of upper lobe, right bronchus or lung: Secondary | ICD-10-CM | POA: Insufficient documentation

## 2021-04-20 DIAGNOSIS — Z86718 Personal history of other venous thrombosis and embolism: Secondary | ICD-10-CM | POA: Insufficient documentation

## 2021-04-20 DIAGNOSIS — G893 Neoplasm related pain (acute) (chronic): Secondary | ICD-10-CM | POA: Insufficient documentation

## 2021-04-20 DIAGNOSIS — C773 Secondary and unspecified malignant neoplasm of axilla and upper limb lymph nodes: Secondary | ICD-10-CM | POA: Insufficient documentation

## 2021-04-20 DIAGNOSIS — F419 Anxiety disorder, unspecified: Secondary | ICD-10-CM | POA: Insufficient documentation

## 2021-04-20 DIAGNOSIS — C3491 Malignant neoplasm of unspecified part of right bronchus or lung: Secondary | ICD-10-CM

## 2021-04-20 DIAGNOSIS — R531 Weakness: Secondary | ICD-10-CM | POA: Insufficient documentation

## 2021-04-20 DIAGNOSIS — Z5111 Encounter for antineoplastic chemotherapy: Secondary | ICD-10-CM | POA: Insufficient documentation

## 2021-04-20 DIAGNOSIS — Z79891 Long term (current) use of opiate analgesic: Secondary | ICD-10-CM | POA: Insufficient documentation

## 2021-04-20 DIAGNOSIS — I10 Essential (primary) hypertension: Secondary | ICD-10-CM | POA: Insufficient documentation

## 2021-04-20 DIAGNOSIS — J910 Malignant pleural effusion: Secondary | ICD-10-CM | POA: Insufficient documentation

## 2021-04-20 DIAGNOSIS — F32A Depression, unspecified: Secondary | ICD-10-CM | POA: Insufficient documentation

## 2021-04-20 DIAGNOSIS — Z79899 Other long term (current) drug therapy: Secondary | ICD-10-CM | POA: Insufficient documentation

## 2021-04-20 DIAGNOSIS — R630 Anorexia: Secondary | ICD-10-CM | POA: Insufficient documentation

## 2021-04-20 DIAGNOSIS — Z7901 Long term (current) use of anticoagulants: Secondary | ICD-10-CM | POA: Insufficient documentation

## 2021-04-20 DIAGNOSIS — K219 Gastro-esophageal reflux disease without esophagitis: Secondary | ICD-10-CM | POA: Insufficient documentation

## 2021-04-20 DIAGNOSIS — R059 Cough, unspecified: Secondary | ICD-10-CM | POA: Insufficient documentation

## 2021-04-20 DIAGNOSIS — Z66 Do not resuscitate: Secondary | ICD-10-CM | POA: Insufficient documentation

## 2021-04-20 DIAGNOSIS — Z8673 Personal history of transient ischemic attack (TIA), and cerebral infarction without residual deficits: Secondary | ICD-10-CM | POA: Insufficient documentation

## 2021-04-20 DIAGNOSIS — Z923 Personal history of irradiation: Secondary | ICD-10-CM | POA: Insufficient documentation

## 2021-04-20 DIAGNOSIS — Z515 Encounter for palliative care: Secondary | ICD-10-CM | POA: Insufficient documentation

## 2021-04-20 LAB — CMP (CANCER CENTER ONLY)
ALT: 52 U/L — ABNORMAL HIGH (ref 0–44)
AST: 37 U/L (ref 15–41)
Albumin: 2.7 g/dL — ABNORMAL LOW (ref 3.5–5.0)
Alkaline Phosphatase: 76 U/L (ref 38–126)
Anion gap: 13 (ref 5–15)
BUN: 11 mg/dL (ref 8–23)
CO2: 27 mmol/L (ref 22–32)
Calcium: 9.7 mg/dL (ref 8.9–10.3)
Chloride: 100 mmol/L (ref 98–111)
Creatinine: 0.94 mg/dL (ref 0.44–1.00)
GFR, Estimated: >60 mL/min (ref >60)
Glucose, Bld: 103 mg/dL — ABNORMAL HIGH (ref 70–99)
Potassium: 2.9 mmol/L — ABNORMAL LOW (ref 3.5–5.1)
Sodium: 140 mmol/L (ref 135–145)
Total Bilirubin: <0.2 mg/dL — ABNORMAL LOW (ref 0.3–1.2)
Total Protein: 7.2 g/dL (ref 6.5–8.1)

## 2021-04-20 LAB — CBC WITH DIFFERENTIAL (CANCER CENTER ONLY)
Abs Immature Granulocytes: 0 10*3/uL (ref 0.00–0.07)
Basophils Absolute: 0 10*3/uL (ref 0.0–0.1)
Basophils Relative: 0 %
Eosinophils Absolute: 0 10*3/uL (ref 0.0–0.5)
Eosinophils Relative: 3 %
HCT: 28.9 % — ABNORMAL LOW (ref 36.0–46.0)
Hemoglobin: 9.4 g/dL — ABNORMAL LOW (ref 12.0–15.0)
Immature Granulocytes: 0 %
Lymphocytes Relative: 24 %
Lymphs Abs: 0.4 10*3/uL — ABNORMAL LOW (ref 0.7–4.0)
MCH: 26.7 pg (ref 26.0–34.0)
MCHC: 32.5 g/dL (ref 30.0–36.0)
MCV: 82.1 fL (ref 80.0–100.0)
Monocytes Absolute: 0.3 10*3/uL (ref 0.1–1.0)
Monocytes Relative: 18 %
Neutro Abs: 0.9 10*3/uL — ABNORMAL LOW (ref 1.7–7.7)
Neutrophils Relative %: 55 %
Platelet Count: 92 10*3/uL — ABNORMAL LOW (ref 150–400)
RBC: 3.52 MIL/uL — ABNORMAL LOW (ref 3.87–5.11)
RDW: 15.3 % (ref 11.5–15.5)
WBC Count: 1.6 10*3/uL — ABNORMAL LOW (ref 4.0–10.5)
nRBC: 0 % (ref 0.0–0.2)

## 2021-04-20 NOTE — Anesthesia Preprocedure Evaluation (Addendum)
Anesthesia Evaluation  Patient identified by MRN, date of birth, ID band Patient awake    Reviewed: Allergy & Precautions, NPO status , Patient's Chart, lab work & pertinent test results, reviewed documented beta blocker date and time   History of Anesthesia Complications (+) PONV  Airway Mallampati: III  TM Distance: >3 FB Neck ROM: Full    Dental no notable dental hx. (+) Teeth Intact, Dental Advisory Given   Pulmonary shortness of breath, asthma ,    Pulmonary exam normal breath sounds clear to auscultation       Cardiovascular hypertension, Pt. on medications and Pt. on home beta blockers + angina + CAD  Normal cardiovascular exam Rhythm:Regular Rate:Normal  TTE 2022 1. Left ventricular ejection fraction, by estimation, is 60 to 65%. The  left ventricle has normal function. The left ventricle has no regional  wall motion abnormalities.  2. The mitral valve is normal in structure. Mild to moderate mitral valve  Regurgitation.  Stress Test 2022 .  The study is normal. The study is low risk. .  No ST deviation was noted. .  Left ventricular function is normal. Nuclear stress EF: 68 %. The left ventricular ejection fraction is hyperdynamic (>65%). End diastolic cavity size is normal. .  Prior study available for comparison from 03/19/2020.    Neuro/Psych PSYCHIATRIC DISORDERS Anxiety Depression CVA    GI/Hepatic Neg liver ROS, GERD  ,  Endo/Other  negative endocrine ROS  Renal/GU negative Renal ROS  negative genitourinary   Musculoskeletal  (+) Arthritis ,   Abdominal   Peds  Hematology  (+) Blood dyscrasia, anemia , Lab Results      Component                Value               Date                      WBC                      1.6 (L)             04/20/2021                HGB                      9.5 (L)             04/21/2021                HCT                      28.0 (L)            04/21/2021                 MCV                      82.1                04/20/2021                PLT                      92 (L)              04/20/2021              Anesthesia Other  Findings Stage IV Lung CA  Reproductive/Obstetrics                          Anesthesia Physical Anesthesia Plan  ASA: 3  Anesthesia Plan: General   Post-op Pain Management:    Induction: Intravenous  PONV Risk Score and Plan: 4 or greater and Midazolam, Dexamethasone and Ondansetron  Airway Management Planned: Oral ETT  Additional Equipment:   Intra-op Plan:   Post-operative Plan: Extubation in OR  Informed Consent: I have reviewed the patients History and Physical, chart, labs and discussed the procedure including the risks, benefits and alternatives for the proposed anesthesia with the patient or authorized representative who has indicated his/her understanding and acceptance.     Dental advisory given  Plan Discussed with: CRNA  Anesthesia Plan Comments: (PAT note by Karoline Caldwell, PA-C:  Follows with cardiology for hx of CAD status post DES to RCA 06/16/19, hypertension, hyperlipidemia, DVT/PE, CVA. Last seen by Dr. Gardiner Rhyme 03/16/21 and at that time was reporting some chest tightness with exertion. Nuclear stress was ordered. Nuclear stress 03/29/21 was low risk, no ischemia, EF 68%.   Admitted to [facility] on 02/11/2021 with acute CVA. This was thought to be embolic in setting of hypercoagulable state due to lung cancer. Oncology recommended switching from Eliquis to Lovenox for anticoagulation.  Stage IV lung cancer: Followed by oncology.Has been onTagrisso. Recent CT showed progression of disease and systemic chemotherapy was started. She has previously undergone a course of palliative radiotherapy.   CBC at cancer center 04/20/21 notable for anemia with HGB 9.4, which is stable from previous 9.6 on 04/14/21.  Pt will need DOS evaluation.   EKG 03/17/21: NSR. Rate 78. Possible LAE.    Nuclear stress 03/29/21: The study is normal. The study is low risk. . No ST deviation was noted. . Left ventricular function is normal. Nuclear stress EF: 68 %. The left ventricular ejection fraction is hyperdynamic (>65%). End diastolic cavity size is normal. . Prior study available for comparison from 03/19/2020.  Normal stress nuclear study with no ischemia or infarction; gated EF 68 with normal wall motion  TTE 02/08/21: 1. Left ventricular ejection fraction, by estimation, is 60 to 65%. The  left ventricle has normal function. The left ventricle has no regional  wall motion abnormalities. Left ventricular diastolic parameters were  normal.  2. Right ventricular systolic function is normal. The right ventricular  size is normal. There is normal pulmonary artery systolic pressure. The  estimated right ventricular systolic pressure is 64.6 mmHg.  3. The mitral valve is normal in structure. Mild to moderate mitral valve  regurgitation. No evidence of mitral stenosis.  4. Tricuspid valve regurgitation is mild to moderate.  5. The aortic valve is normal in structure. Aortic valve regurgitation is  not visualized. No aortic stenosis is present.  6. The inferior vena cava is normal in size with greater than 50%  respiratory variability, suggesting right atrial pressure of 3 mmHg.   )      Anesthesia Quick Evaluation

## 2021-04-20 NOTE — Progress Notes (Signed)
Anesthesia Chart Review: Same day workup  Follows with cardiology for hx of CAD status post DES to RCA 06/16/19, hypertension, hyperlipidemia, DVT/PE, CVA. Last seen by Dr. Gardiner Rhyme 03/16/21 and at that time was reporting some chest tightness with exertion. Nuclear stress was ordered. Nuclear stress 03/29/21 was low risk, no ischemia, EF 68%.   Admitted to [facility] on 02/11/2021 with acute CVA.  This was thought to be embolic in setting of hypercoagulable state due to lung cancer.  Oncology recommended switching from Eliquis to Lovenox for anticoagulation.  Stage IV lung cancer: Followed by oncology.  Has been on Tagrisso.  Recent CT showed progression of disease and systemic chemotherapy was started. She has previously undergone a course of palliative radiotherapy.   CBC at cancer center 04/20/21 notable for anemia with HGB 9.4, which is stable from previous 9.6 on 04/14/21.  Pt will need DOS evaluation.   EKG 03/17/21: NSR. Rate 78. Possible LAE.   Nuclear stress 03/29/21: The study is normal. The study is low risk.   No ST deviation was noted.   Left ventricular function is normal. Nuclear stress EF: 68 %. The left ventricular ejection fraction is hyperdynamic (>65%). End diastolic cavity size is normal.   Prior study available for comparison from 03/19/2020.   Normal stress nuclear study with no ischemia or infarction; gated EF 68 with normal wall motion  TTE 02/08/21:  1. Left ventricular ejection fraction, by estimation, is 60 to 65%. The  left ventricle has normal function. The left ventricle has no regional  wall motion abnormalities. Left ventricular diastolic parameters were  normal.   2. Right ventricular systolic function is normal. The right ventricular  size is normal. There is normal pulmonary artery systolic pressure. The  estimated right ventricular systolic pressure is 58.5 mmHg.   3. The mitral valve is normal in structure. Mild to moderate mitral valve  regurgitation. No  evidence of mitral stenosis.   4. Tricuspid valve regurgitation is mild to moderate.   5. The aortic valve is normal in structure. Aortic valve regurgitation is  not visualized. No aortic stenosis is present.   6. The inferior vena cava is normal in size with greater than 50%  respiratory variability, suggesting right atrial pressure of 3 mmHg.    Allison [name] [facility]/Anesthesiology Phone [phone] 04/20/2021 11:51 AM

## 2021-04-21 ENCOUNTER — Inpatient Hospital Stay (HOSPITAL_COMMUNITY)
Admission: AD | Admit: 2021-04-21 | Discharge: 2021-04-26 | DRG: 166 | Disposition: A | Discharge status: Home / Self Care | Payer: Medicare HMO | Attending: Internal Medicine | Admitting: Internal Medicine

## 2021-04-21 ENCOUNTER — Other Ambulatory Visit: Payer: Self-pay

## 2021-04-21 ENCOUNTER — Other Ambulatory Visit: Payer: Medicare HMO

## 2021-04-21 ENCOUNTER — Ambulatory Visit (HOSPITAL_COMMUNITY): Payer: Medicare HMO

## 2021-04-21 ENCOUNTER — Ambulatory Visit (HOSPITAL_COMMUNITY): Payer: Medicare HMO | Admitting: Physician Assistant

## 2021-04-21 ENCOUNTER — Encounter (HOSPITAL_COMMUNITY)
Admission: AD | Disposition: A | Discharge status: Home / Self Care | Payer: Self-pay | Source: Home / Self Care | Attending: Internal Medicine

## 2021-04-21 ENCOUNTER — Encounter (HOSPITAL_COMMUNITY): Payer: Self-pay | Admitting: Pulmonary Disease

## 2021-04-21 DIAGNOSIS — I7300 Raynaud's syndrome without gangrene: Secondary | ICD-10-CM | POA: Diagnosis present

## 2021-04-21 DIAGNOSIS — T451X5A Adverse effect of antineoplastic and immunosuppressive drugs, initial encounter: Secondary | ICD-10-CM | POA: Diagnosis present

## 2021-04-21 DIAGNOSIS — G4700 Insomnia, unspecified: Secondary | ICD-10-CM | POA: Diagnosis present

## 2021-04-21 DIAGNOSIS — E739 Lactose intolerance, unspecified: Secondary | ICD-10-CM | POA: Diagnosis present

## 2021-04-21 DIAGNOSIS — R911 Solitary pulmonary nodule: Secondary | ICD-10-CM | POA: Diagnosis not present

## 2021-04-21 DIAGNOSIS — I69320 Aphasia following cerebral infarction: Secondary | ICD-10-CM

## 2021-04-21 DIAGNOSIS — Z923 Personal history of irradiation: Secondary | ICD-10-CM | POA: Diagnosis not present

## 2021-04-21 DIAGNOSIS — E785 Hyperlipidemia, unspecified: Secondary | ICD-10-CM | POA: Diagnosis not present

## 2021-04-21 DIAGNOSIS — K219 Gastro-esophageal reflux disease without esophagitis: Secondary | ICD-10-CM | POA: Diagnosis present

## 2021-04-21 DIAGNOSIS — J9811 Atelectasis: Secondary | ICD-10-CM | POA: Diagnosis not present

## 2021-04-21 DIAGNOSIS — J9382 Other air leak: Secondary | ICD-10-CM | POA: Diagnosis present

## 2021-04-21 DIAGNOSIS — R918 Other nonspecific abnormal finding of lung field: Secondary | ICD-10-CM | POA: Diagnosis not present

## 2021-04-21 DIAGNOSIS — G893 Neoplasm related pain (acute) (chronic): Secondary | ICD-10-CM | POA: Diagnosis present

## 2021-04-21 DIAGNOSIS — J95811 Postprocedural pneumothorax: Secondary | ICD-10-CM | POA: Diagnosis not present

## 2021-04-21 DIAGNOSIS — E43 Unspecified severe protein-calorie malnutrition: Secondary | ICD-10-CM | POA: Insufficient documentation

## 2021-04-21 DIAGNOSIS — I10 Essential (primary) hypertension: Secondary | ICD-10-CM | POA: Diagnosis not present

## 2021-04-21 DIAGNOSIS — R042 Hemoptysis: Secondary | ICD-10-CM | POA: Diagnosis not present

## 2021-04-21 DIAGNOSIS — Z66 Do not resuscitate: Secondary | ICD-10-CM | POA: Diagnosis not present

## 2021-04-21 DIAGNOSIS — F411 Generalized anxiety disorder: Secondary | ICD-10-CM | POA: Diagnosis present

## 2021-04-21 DIAGNOSIS — R627 Adult failure to thrive: Secondary | ICD-10-CM | POA: Diagnosis present

## 2021-04-21 DIAGNOSIS — Z419 Encounter for procedure for purposes other than remedying health state, unspecified: Secondary | ICD-10-CM | POA: Diagnosis not present

## 2021-04-21 DIAGNOSIS — Z955 Presence of coronary angioplasty implant and graft: Secondary | ICD-10-CM

## 2021-04-21 DIAGNOSIS — G8929 Other chronic pain: Secondary | ICD-10-CM | POA: Diagnosis present

## 2021-04-21 DIAGNOSIS — E876 Hypokalemia: Secondary | ICD-10-CM | POA: Diagnosis not present

## 2021-04-21 DIAGNOSIS — D701 Agranulocytosis secondary to cancer chemotherapy: Secondary | ICD-10-CM | POA: Diagnosis present

## 2021-04-21 DIAGNOSIS — J910 Malignant pleural effusion: Secondary | ICD-10-CM | POA: Diagnosis not present

## 2021-04-21 DIAGNOSIS — Z9689 Presence of other specified functional implants: Secondary | ICD-10-CM

## 2021-04-21 DIAGNOSIS — Z6820 Body mass index (BMI) 20.0-20.9, adult: Secondary | ICD-10-CM

## 2021-04-21 DIAGNOSIS — C782 Secondary malignant neoplasm of pleura: Secondary | ICD-10-CM | POA: Diagnosis not present

## 2021-04-21 DIAGNOSIS — Z7189 Other specified counseling: Secondary | ICD-10-CM | POA: Diagnosis not present

## 2021-04-21 DIAGNOSIS — Z9889 Other specified postprocedural states: Secondary | ICD-10-CM

## 2021-04-21 DIAGNOSIS — Z8249 Family history of ischemic heart disease and other diseases of the circulatory system: Secondary | ICD-10-CM

## 2021-04-21 DIAGNOSIS — C3491 Malignant neoplasm of unspecified part of right bronchus or lung: Secondary | ICD-10-CM | POA: Diagnosis not present

## 2021-04-21 DIAGNOSIS — R0602 Shortness of breath: Secondary | ICD-10-CM | POA: Diagnosis not present

## 2021-04-21 DIAGNOSIS — I2510 Atherosclerotic heart disease of native coronary artery without angina pectoris: Secondary | ICD-10-CM | POA: Diagnosis present

## 2021-04-21 DIAGNOSIS — Z801 Family history of malignant neoplasm of trachea, bronchus and lung: Secondary | ICD-10-CM | POA: Diagnosis not present

## 2021-04-21 DIAGNOSIS — Z515 Encounter for palliative care: Secondary | ICD-10-CM | POA: Diagnosis not present

## 2021-04-21 DIAGNOSIS — J939 Pneumothorax, unspecified: Secondary | ICD-10-CM | POA: Diagnosis not present

## 2021-04-21 DIAGNOSIS — R0781 Pleurodynia: Secondary | ICD-10-CM

## 2021-04-21 DIAGNOSIS — M1990 Unspecified osteoarthritis, unspecified site: Secondary | ICD-10-CM | POA: Diagnosis present

## 2021-04-21 DIAGNOSIS — D630 Anemia in neoplastic disease: Secondary | ICD-10-CM | POA: Diagnosis not present

## 2021-04-21 DIAGNOSIS — Z85118 Personal history of other malignant neoplasm of bronchus and lung: Secondary | ICD-10-CM | POA: Diagnosis not present

## 2021-04-21 DIAGNOSIS — I700 Atherosclerosis of aorta: Secondary | ICD-10-CM | POA: Diagnosis not present

## 2021-04-21 DIAGNOSIS — J984 Other disorders of lung: Secondary | ICD-10-CM | POA: Diagnosis not present

## 2021-04-21 DIAGNOSIS — R079 Chest pain, unspecified: Secondary | ICD-10-CM | POA: Diagnosis not present

## 2021-04-21 DIAGNOSIS — J90 Pleural effusion, not elsewhere classified: Secondary | ICD-10-CM | POA: Diagnosis not present

## 2021-04-21 DIAGNOSIS — Z4682 Encounter for fitting and adjustment of non-vascular catheter: Secondary | ICD-10-CM | POA: Diagnosis not present

## 2021-04-21 DIAGNOSIS — H409 Unspecified glaucoma: Secondary | ICD-10-CM | POA: Diagnosis present

## 2021-04-21 HISTORY — PX: BRONCHIAL BIOPSY: SHX5109

## 2021-04-21 HISTORY — PX: CHEST TUBE INSERTION: SHX231

## 2021-04-21 HISTORY — DX: Anemia, unspecified: D64.9

## 2021-04-21 HISTORY — PX: VIDEO BRONCHOSCOPY WITH ENDOBRONCHIAL NAVIGATION: SHX6175

## 2021-04-21 HISTORY — DX: Dyspnea, unspecified: R06.00

## 2021-04-21 HISTORY — PX: BRONCHIAL BRUSHINGS: SHX5108

## 2021-04-21 HISTORY — PX: BRONCHIAL NEEDLE ASPIRATION BIOPSY: SHX5106

## 2021-04-21 LAB — POCT I-STAT, CHEM 8
BUN: 10 mg/dL (ref 8–23)
Calcium, Ion: 1.2 mmol/L (ref 1.15–1.40)
Chloride: 97 mmol/L — ABNORMAL LOW (ref 98–111)
Creatinine, Ser: 1.2 mg/dL — ABNORMAL HIGH (ref 0.44–1.00)
Glucose, Bld: 105 mg/dL — ABNORMAL HIGH (ref 70–99)
HCT: 28 % — ABNORMAL LOW (ref 36.0–46.0)
Hemoglobin: 9.5 g/dL — ABNORMAL LOW (ref 12.0–15.0)
Potassium: 2.8 mmol/L — ABNORMAL LOW (ref 3.5–5.1)
Sodium: 136 mmol/L (ref 135–145)
TCO2: 26 mmol/L (ref 22–32)

## 2021-04-21 LAB — GLUCOSE, CAPILLARY: Glucose-Capillary: 94 mg/dL (ref 70–99)

## 2021-04-21 SURGERY — VIDEO BRONCHOSCOPY WITH ENDOBRONCHIAL NAVIGATION
Anesthesia: General | Laterality: Bilateral

## 2021-04-21 MED ORDER — PROPOFOL 10 MG/ML IV BOLUS
INTRAVENOUS | Status: DC | PRN
  Administered 2021-04-21: 70 mg via INTRAVENOUS
  Administered 2021-04-21: 50 mg via INTRAVENOUS
  Administered 2021-04-21: 30 mg via INTRAVENOUS
  Administered 2021-04-21: 15 mg via INTRAVENOUS

## 2021-04-21 MED ORDER — EZETIMIBE 10 MG PO TABS
10.0000 mg | ORAL_TABLET | Freq: Every day | ORAL | Status: DC
  Administered 2021-04-22 – 2021-04-26 (×4): 10 mg via ORAL
  Filled 2021-04-21 (×5): qty 1

## 2021-04-21 MED ORDER — DEXAMETHASONE SODIUM PHOSPHATE 10 MG/ML IJ SOLN
INTRAMUSCULAR | Status: DC | PRN
  Administered 2021-04-21: 10 mg via INTRAVENOUS

## 2021-04-21 MED ORDER — POTASSIUM CHLORIDE CRYS ER 20 MEQ PO TBCR
40.0000 meq | EXTENDED_RELEASE_TABLET | Freq: Once | ORAL | Status: AC
  Administered 2021-04-21: 40 meq via ORAL
  Filled 2021-04-21: qty 2

## 2021-04-21 MED ORDER — PHENYLEPHRINE HCL-NACL 20-0.9 MG/250ML-% IV SOLN
INTRAVENOUS | Status: DC | PRN
  Administered 2021-04-21: 50 ug/min via INTRAVENOUS

## 2021-04-21 MED ORDER — ALPRAZOLAM 0.25 MG PO TABS
0.2500 mg | ORAL_TABLET | Freq: Two times a day (BID) | ORAL | Status: DC | PRN
  Administered 2021-04-21: 0.25 mg via ORAL
  Filled 2021-04-21: qty 1

## 2021-04-21 MED ORDER — DORZOLAMIDE HCL-TIMOLOL MAL 2-0.5 % OP SOLN
1.0000 [drp] | Freq: Two times a day (BID) | OPHTHALMIC | Status: DC
  Administered 2021-04-21 – 2021-04-26 (×9): 1 [drp] via OPHTHALMIC
  Filled 2021-04-21: qty 10

## 2021-04-21 MED ORDER — FENTANYL CITRATE (PF) 100 MCG/2ML IJ SOLN
25.0000 ug | INTRAMUSCULAR | Status: DC | PRN
  Filled 2021-04-21: qty 1

## 2021-04-21 MED ORDER — LATANOPROST 0.005 % OP SOLN
1.0000 [drp] | Freq: Every day | OPHTHALMIC | Status: DC
  Administered 2021-04-21 – 2021-04-25 (×5): 1 [drp] via OPHTHALMIC
  Filled 2021-04-21: qty 2.5

## 2021-04-21 MED ORDER — CHLORHEXIDINE GLUCONATE 0.12 % MT SOLN
OROMUCOSAL | Status: AC
  Filled 2021-04-21: qty 15

## 2021-04-21 MED ORDER — ONDANSETRON HCL 4 MG/2ML IJ SOLN
INTRAMUSCULAR | Status: DC | PRN
  Administered 2021-04-21: 4 mg via INTRAVENOUS

## 2021-04-21 MED ORDER — ASPIRIN 81 MG PO CHEW
81.0000 mg | CHEWABLE_TABLET | Freq: Every day | ORAL | Status: DC
  Administered 2021-04-22 – 2021-04-26 (×3): 81 mg via ORAL
  Filled 2021-04-21 (×3): qty 1

## 2021-04-21 MED ORDER — OXYCODONE HCL 5 MG PO TABS
5.0000 mg | ORAL_TABLET | ORAL | Status: DC | PRN
  Administered 2021-04-21 – 2021-04-24 (×10): 5 mg via ORAL
  Filled 2021-04-21 (×11): qty 1

## 2021-04-21 MED ORDER — SODIUM CHLORIDE 0.9% FLUSH
10.0000 mL | Freq: Three times a day (TID) | INTRAVENOUS | Status: DC
  Administered 2021-04-21 – 2021-04-25 (×3): 10 mL

## 2021-04-21 MED ORDER — LACTATED RINGERS IV SOLN: INTRAVENOUS | Status: DC

## 2021-04-21 MED ORDER — OSIMERTINIB MESYLATE 80 MG PO TABS
80.0000 mg | ORAL_TABLET | Freq: Every day | ORAL | Status: DC
  Administered 2021-04-22 – 2021-04-25 (×4): 80 mg via ORAL
  Filled 2021-04-21 (×4): qty 1

## 2021-04-21 MED ORDER — PHENOL 1.4 % MT LIQD
1.0000 | OROMUCOSAL | Status: DC | PRN
  Administered 2021-04-21: 1 via OROMUCOSAL
  Filled 2021-04-21: qty 177

## 2021-04-21 MED ORDER — FENTANYL CITRATE PF 50 MCG/ML IJ SOSY
25.0000 ug | PREFILLED_SYRINGE | INTRAMUSCULAR | Status: DC | PRN
  Administered 2021-04-21 (×2): 50 ug via INTRAVENOUS
  Filled 2021-04-21 (×2): qty 1

## 2021-04-21 MED ORDER — ROCURONIUM BROMIDE 10 MG/ML (PF) SYRINGE
PREFILLED_SYRINGE | INTRAVENOUS | Status: DC | PRN
  Administered 2021-04-21: 50 mg via INTRAVENOUS
  Administered 2021-04-21: 20 mg via INTRAVENOUS

## 2021-04-21 MED ORDER — ENOXAPARIN SODIUM 60 MG/0.6ML IJ SOSY: 60.0000 mg | PREFILLED_SYRINGE | Freq: Two times a day (BID) | INTRAMUSCULAR | Status: DC

## 2021-04-21 MED ORDER — ACETAMINOPHEN 500 MG PO TABS: 1000.0000 mg | ORAL_TABLET | Freq: Once | ORAL | Status: DC

## 2021-04-21 MED ORDER — PROPOFOL 500 MG/50ML IV EMUL
INTRAVENOUS | Status: DC | PRN
  Administered 2021-04-21: 50 ug/kg/min via INTRAVENOUS

## 2021-04-21 MED ORDER — MIDAZOLAM HCL 2 MG/2ML IJ SOLN
INTRAMUSCULAR | Status: DC | PRN
  Administered 2021-04-21: 2 mg via INTRAVENOUS

## 2021-04-21 MED ORDER — SUCRALFATE 1 G PO TABS
1.0000 g | ORAL_TABLET | Freq: Three times a day (TID) | ORAL | Status: DC
  Administered 2021-04-21 – 2021-04-26 (×14): 1 g via ORAL
  Filled 2021-04-21 (×14): qty 1

## 2021-04-21 MED ORDER — FENTANYL CITRATE (PF) 250 MCG/5ML IJ SOLN
INTRAMUSCULAR | Status: DC | PRN
  Administered 2021-04-21: 100 ug via INTRAVENOUS

## 2021-04-21 MED ORDER — DOCUSATE SODIUM 100 MG PO CAPS: 100.0000 mg | ORAL_CAPSULE | Freq: Two times a day (BID) | ORAL | Status: DC | PRN

## 2021-04-21 MED ORDER — PANTOPRAZOLE SODIUM 40 MG PO TBEC
40.0000 mg | DELAYED_RELEASE_TABLET | Freq: Every day | ORAL | Status: DC
  Administered 2021-04-22 – 2021-04-26 (×5): 40 mg via ORAL
  Filled 2021-04-21 (×5): qty 1

## 2021-04-21 MED ORDER — CHLORTHALIDONE 25 MG PO TABS
25.0000 mg | ORAL_TABLET | Freq: Every day | ORAL | Status: DC
  Administered 2021-04-22 – 2021-04-26 (×4): 25 mg via ORAL
  Filled 2021-04-21 (×5): qty 1

## 2021-04-21 MED ORDER — SUGAMMADEX SODIUM 200 MG/2ML IV SOLN
INTRAVENOUS | Status: DC | PRN
  Administered 2021-04-21: 200 mg via INTRAVENOUS
  Administered 2021-04-21: 100 mg via INTRAVENOUS

## 2021-04-21 MED ORDER — FOLIC ACID 1 MG PO TABS
1.0000 mg | ORAL_TABLET | Freq: Every day | ORAL | Status: DC
  Administered 2021-04-22 – 2021-04-26 (×5): 1 mg via ORAL
  Filled 2021-04-21 (×5): qty 1

## 2021-04-21 MED ORDER — ALBUTEROL SULFATE HFA 108 (90 BASE) MCG/ACT IN AERS
INHALATION_SPRAY | RESPIRATORY_TRACT | Status: DC | PRN
  Administered 2021-04-21: 2 via RESPIRATORY_TRACT

## 2021-04-21 MED ORDER — AMLODIPINE BESYLATE 5 MG PO TABS
5.0000 mg | ORAL_TABLET | Freq: Every day | ORAL | Status: DC
  Administered 2021-04-22 – 2021-04-26 (×5): 5 mg via ORAL
  Filled 2021-04-21 (×5): qty 1

## 2021-04-21 MED ORDER — PHENYLEPHRINE 40 MCG/ML (10ML) SYRINGE FOR IV PUSH (FOR BLOOD PRESSURE SUPPORT)
PREFILLED_SYRINGE | INTRAVENOUS | Status: DC | PRN
  Administered 2021-04-21: 120 ug via INTRAVENOUS
  Administered 2021-04-21: 80 ug via INTRAVENOUS
  Administered 2021-04-21: 120 ug via INTRAVENOUS

## 2021-04-21 MED ORDER — FENTANYL CITRATE (PF) 100 MCG/2ML IJ SOLN
25.0000 ug | INTRAMUSCULAR | Status: DC | PRN
  Administered 2021-04-21 (×3): 50 ug via INTRAVENOUS

## 2021-04-21 MED ORDER — LIDOCAINE 2% (20 MG/ML) 5 ML SYRINGE
INTRAMUSCULAR | Status: DC | PRN
  Administered 2021-04-21: 50 mg via INTRAVENOUS

## 2021-04-21 MED ORDER — ISOSORBIDE MONONITRATE ER 30 MG PO TB24
30.0000 mg | ORAL_TABLET | Freq: Every day | ORAL | Status: DC
  Administered 2021-04-22 – 2021-04-26 (×5): 30 mg via ORAL
  Filled 2021-04-21 (×5): qty 1

## 2021-04-21 MED ORDER — B COMPLEX-C PO TABS
1.0000 | ORAL_TABLET | Freq: Every day | ORAL | Status: DC
  Administered 2021-04-22 – 2021-04-26 (×4): 1 via ORAL
  Filled 2021-04-21 (×6): qty 1

## 2021-04-21 MED ORDER — POLYETHYLENE GLYCOL 3350 17 G PO PACK: 17.0000 g | PACK | Freq: Every day | ORAL | Status: DC | PRN

## 2021-04-21 MED ORDER — ENOXAPARIN SODIUM 40 MG/0.4ML IJ SOSY: 40.0000 mg | PREFILLED_SYRINGE | INTRAMUSCULAR | Status: DC

## 2021-04-21 MED ORDER — CARVEDILOL 12.5 MG PO TABS
12.5000 mg | ORAL_TABLET | Freq: Two times a day (BID) | ORAL | Status: DC
  Administered 2021-04-21 – 2021-04-26 (×10): 12.5 mg via ORAL
  Filled 2021-04-21 (×11): qty 1

## 2021-04-21 SURGICAL SUPPLY — 51 items
ADAPTER BRONCH F/PENTAX (ADAPTER) ×3 IMPLANT
ADAPTER VALVE BIOPSY EBUS (MISCELLANEOUS) IMPLANT
ADPR BSCP EDG PNTX (ADAPTER) ×2
ADPTR VALVE BIOPSY EBUS (MISCELLANEOUS)
BRUSH CYTOL CELLEBRITY 1.5X140 (MISCELLANEOUS) ×3 IMPLANT
BRUSH SUPERTRAX BIOPSY (INSTRUMENTS) IMPLANT
BRUSH SUPERTRAX NDL-TIP CYTO (INSTRUMENTS) ×3 IMPLANT
CANISTER SUCT 3000ML PPV (MISCELLANEOUS) ×3 IMPLANT
CHANNEL WORK EXTEND EDGE 180 (KITS) IMPLANT
CHANNEL WORK EXTEND EDGE 45 (KITS) IMPLANT
CHANNEL WORK EXTEND EDGE 90 (KITS) IMPLANT
CONT SPEC 4OZ CLIKSEAL STRL BL (MISCELLANEOUS) ×3 IMPLANT
COVER BACK TABLE 60X90IN (DRAPES) ×3 IMPLANT
COVER DOME SNAP 22 D (MISCELLANEOUS) ×3 IMPLANT
FILTER STRAW FLUID ASPIR (MISCELLANEOUS) IMPLANT
FORCEPS BIOP RJ4 1.8 (CUTTING FORCEPS) IMPLANT
FORCEPS BIOP SUPERTRX PREMAR (INSTRUMENTS) ×3 IMPLANT
GAUZE SPONGE 4X4 12PLY STRL (GAUZE/BANDAGES/DRESSINGS) ×3 IMPLANT
GLOVE BIO SURGEON STRL SZ7.5 (GLOVE) ×3 IMPLANT
GLOVE SURG SS PI 7.5 STRL IVOR (GLOVE) ×6 IMPLANT
GOWN STRL REUS W/ TWL LRG LVL3 (GOWN DISPOSABLE) ×4 IMPLANT
GOWN STRL REUS W/TWL LRG LVL3 (GOWN DISPOSABLE) ×6
KIT CLEAN ENDO COMPLIANCE (KITS) ×6 IMPLANT
KIT LOCATABLE GUIDE (CANNULA) IMPLANT
KIT MARKER FIDUCIAL DELIVERY (KITS) IMPLANT
KIT PROCEDURE EDGE 180 (KITS) IMPLANT
KIT PROCEDURE EDGE 45 (KITS) IMPLANT
KIT PROCEDURE EDGE 90 (KITS) IMPLANT
KIT TURNOVER KIT B (KITS) ×3 IMPLANT
MARKER SKIN DUAL TIP RULER LAB (MISCELLANEOUS) ×3 IMPLANT
NEEDLE EBUS SONO TIP PENTAX (NEEDLE) ×3 IMPLANT
NEEDLE SUPERTRX PREMARK BIOPSY (NEEDLE) ×3 IMPLANT
NS IRRIG 1000ML POUR BTL (IV SOLUTION) ×3 IMPLANT
OIL SILICONE PENTAX (PARTS (SERVICE/REPAIRS)) ×3 IMPLANT
PAD ARMBOARD 7.5X6 YLW CONV (MISCELLANEOUS) ×6 IMPLANT
PATCHES PATIENT (LABEL) ×9 IMPLANT
SOL ANTI FOG 6CC (MISCELLANEOUS) ×2 IMPLANT
SOLUTION ANTI FOG 6CC (MISCELLANEOUS) ×1
SYR 20CC LL (SYRINGE) ×6 IMPLANT
SYR 20ML ECCENTRIC (SYRINGE) ×6 IMPLANT
SYR 50ML SLIP (SYRINGE) ×3 IMPLANT
SYR 5ML LUER SLIP (SYRINGE) ×3 IMPLANT
TOWEL OR 17X24 6PK STRL BLUE (TOWEL DISPOSABLE) ×3 IMPLANT
TRAP SPECIMEN MUCOUS 40CC (MISCELLANEOUS) IMPLANT
TUBE CONNECTING 20X1/4 (TUBING) ×6 IMPLANT
UNDERPAD 30X30 (UNDERPADS AND DIAPERS) ×3 IMPLANT
VALVE BIOPSY  SINGLE USE (MISCELLANEOUS) ×3
VALVE BIOPSY SINGLE USE (MISCELLANEOUS) ×2 IMPLANT
VALVE DISPOSABLE (MISCELLANEOUS) ×3 IMPLANT
VALVE SUCTION BRONCHIO DISP (MISCELLANEOUS) ×3 IMPLANT
WATER STERILE IRR 1000ML POUR (IV SOLUTION) ×3 IMPLANT

## 2021-04-21 NOTE — Anesthesia Procedure Notes (Signed)
Procedure Name: Intubation Date/Time: 04/21/2021 9:18 AM Performed by: Mariea Clonts, CRNA Pre-anesthesia Checklist: Patient identified, Emergency Drugs available, Suction available and Patient being monitored Patient Re-evaluated:Patient Re-evaluated prior to induction Oxygen Delivery Method: Circle System Utilized Preoxygenation: Pre-oxygenation with 100% oxygen Induction Type: IV induction Ventilation: Mask ventilation without difficulty and Oral airway inserted - appropriate to patient size Laryngoscope Size: Glidescope and 3 Grade View: Grade I Tube type: Oral Tube size: 8.5 mm Number of attempts: 3 Airway Equipment and Method: Stylet and Oral airway Placement Confirmation: ETT inserted through vocal cords under direct vision, positive ETCO2 and breath sounds checked- equal and bilateral Tube secured with: Tape Dental Injury: Teeth and Oropharynx as per pre-operative assessment  Difficulty Due To: Difficulty was anticipated and Difficult Airway- due to dentition Future Recommendations: Recommend- induction with short-acting agent, and alternative techniques readily available Comments: Dl with mac and miller blades.  Poor visualization with each blade.  Glidescope go size 3 utilized with excellent visualization.

## 2021-04-21 NOTE — Procedures (Signed)
Insertion of Chest Tube Procedure Note  Allison [name]  [id number]  21-Dec-1950  Date:04/21/21  Time:11:30 AM    Provider Performing: Allison [name] Allison [name]   Procedure: Chest Tube Insertion ([id number])  Indication(s) Pneumothorax  Consent Unable to obtain consent due to emergent nature of procedure.  Anesthesia Topical only with 1% lidocaine    Time Out Verified patient identification, verified procedure, site/side was marked, verified correct patient position, special equipment/implants available, medications/allergies/relevant history reviewed, required imaging and test results available.   Sterile Technique Maximal sterile technique including full sterile barrier drape, hand hygiene, sterile gown, sterile gloves, mask, hair covering, sterile ultrasound probe cover (if used).   Procedure Description Ultrasound used to identify appropriate pleural anatomy for placement and overlying skin marked. Area of placement cleaned and draped in sterile fashion.  A 81fr French pigtail pleural catheter was placed into the left pleural space using Seldinger technique. Appropriate return of air was obtained.  The tube was connected to atrium and placed on -20 cm H2O wall suction.   Complications/Tolerance None; patient tolerated the procedure well. Chest X-ray is ordered to verify placement.   EBL Minimal  Specimen(s) none  Allison Nash, DO [facility] 04/21/2021 11:31 AM

## 2021-04-21 NOTE — Discharge Instructions (Signed)
Flexible Bronchoscopy, Care After This sheet gives you information about how to care for yourself after your test. Your doctor may also give you more specific instructions. If you have problems or questions, contact your doctor. Follow these instructions at home: Eating and drinking Do not eat or drink anything (not even water) for 2 hours after your test, or until your numbing medicine (local anesthetic) wears off. When your numbness is gone and your cough and gag reflexes have come back, you may: Eat only soft foods. Slowly drink liquids. The day after the test, go back to your normal diet. Driving Do not drive for 24 hours if you were given a medicine to help you relax (sedative). Do not drive or use heavy machinery while taking prescription pain medicine. General instructions  Take over-the-counter and prescription medicines only as told by your doctor. Return to your normal activities as told. Ask what activities are safe for you. Do not use any products that have nicotine or tobacco in them. This includes cigarettes and e-cigarettes. If you need help quitting, ask your doctor. Keep all follow-up visits as told by your doctor. This is important. It is very important if you had a tissue sample (biopsy) taken. Get help right away if: You have shortness of breath that gets worse. You get light-headed. You feel like you are going to pass out (faint). You have chest pain. You cough up: More than a little blood. More blood than before. Summary Do not eat or drink anything (not even water) for 2 hours after your test, or until your numbing medicine wears off. Do not use cigarettes. Do not use e-cigarettes. Get help right away if you have chest pain.  This information is not intended to replace advice given to you by your health care provider. Make sure you discuss any questions you have with your health care provider. Document Released: 04/02/2009 Document Revised: 05/18/2017 Document  Reviewed: 06/23/2016 Elsevier Patient Education  2020 [employer].

## 2021-04-21 NOTE — H&P (Addendum)
Synopsis: Referred in Oct 2022 for lung cancer by No ref. provider found  Subjective:   PATIENT ID: Allison [name] GENDER: female DOB: 09-22-1950, MRN: [id number]     This is a 70 year old female, past medical history of stage IV adenocarcinoma of the lung.  Was initially diagnosed with a pleural effusion in July 2019.  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  Initially had a Pleurx catheter placed.  Had palliative radiotherapy and radiation as well as placement on Tagrisso.  Overall doing well for treatment.  Unfortunately CT imaging revealed progression of disease.  Has started chemotherapy with Dr. [name].  CT scan of the chest on 03/04/2021 was completed.  She had evidence of septal thickening nodularity extensive pleural thickening loculated right effusion concern for lymphangitic spread.  Patient was referred today to discuss bronchoscopy and biopsy.  Some of these lesions have a chance for additional mutations with therapy.  Therefore oncology is requesting a repeat biopsy.   04/21/2021: Here today for planned outpatient bronchoscopy    Past Medical History:  Diagnosis Date   Anemia    Anxiety    Arthritis    Asthma    exercise induced   Depression    PMH   Dyspnea    GERD (gastroesophageal reflux disease)    Glaucoma    History of radiation therapy 01/05/2021   IMRT right lung  11/24/2020-01/05/2021  Dr [name]   Hypertension    lung ca dx'd 11/2017   right   Malignant pleural effusion    right   PONV (postoperative nausea and vomiting)    Pre-diabetes    Raynaud's disease    Raynaud's disease    Stroke ([place]) 01/2021   balance off, some express aphasia, weakness     Family History  Problem Relation Age of Onset   Heart disease Sister    Heart disease Brother    Lung cancer Other      Past Surgical History:  Procedure Laterality Date   ABDOMINAL HYSTERECTOMY     partial   CHEST TUBE INSERTION Right 01/01/2018    Procedure: INSERTION PLEURAL DRAINAGE CATHETER;  Surgeon: Ivin Poot, MD;  Location: [place];  Service: Thoracic;  Laterality: Right;   COLONOSCOPY     CORONARY STENT INTERVENTION N/A 06/16/2019   Procedure: CORONARY STENT INTERVENTION;  Surgeon: Jettie Booze, MD;  Location: [place] [facility];  Service: Cardiovascular;  Laterality: N/A;   DILATION AND CURETTAGE OF UTERUS     EYE SURGERY     due to Glaucoma   IR IMAGING GUIDED PORT INSERTION  03/22/2021   IR PORT REPAIR CENTRAL VENOUS ACCESS DEVICE  04/15/2021   LEFT HEART CATH AND CORONARY ANGIOGRAPHY N/A 06/16/2019   Procedure: LEFT HEART CATH AND CORONARY ANGIOGRAPHY;  Surgeon: Jettie Booze, MD;  Location: [place] [facility];  Service: Cardiovascular;  Laterality: N/A;   REMOVAL OF PLEURAL DRAINAGE CATHETER Right 11/07/2018   Procedure: REMOVAL OF PLEURAL DRAINAGE CATHETER;  Surgeon: Ivin Poot, MD;  Location: [place];  Service: Thoracic;  Laterality: Right;   ROTATOR CUFF REPAIR     TUBAL LIGATION     WISDOM TOOTH EXTRACTION      Social History   Socioeconomic History   Marital status: Widowed    Spouse name: Not on file   Number of children: 3   Years of education: Not on file   Highest education level: Not on file  Occupational History    Employer: [employer]  Tobacco Use   Smoking status: Never   Smokeless tobacco: Never  Vaping Use   Vaping Use: Never used  Substance and Sexual Activity   Alcohol use: Not Currently    Comment: up to 3 drinks per week   Drug use: No    Comment: CBD oil    Sexual activity: Not Currently    Comment: Hysterectomy  Other Topics Concern   Not on file  Social History Narrative   Not on file   Social Determinants of Health   Financial Resource Strain: Not on file  Food Insecurity: Not on file  Transportation Needs: Not on file  Physical Activity: Not on file  Stress: Not on file  Social Connections: Not on file  Intimate Partner Violence: Not on file      Allergies  Allergen Reactions   Penicillins Other (See Comments)    SYNCOPE PATIENT HAS HAD A PCN REACTION WITH IMMEDIATE RASH, FACIAL/TONGUE/THROAT SWELLING, SOB, OR LIGHTHEADEDNESS WITH HYPOTENSION:  #  #  YES  #  # Has patient had a PCN reaction causing severe rash involving mucus membranes or skin necrosis: No Has patient had a PCN reaction that required hospitalization: No Has patient had a PCN reaction occurring within the last 10 years: No    Vicodin [Hydrocodone-Acetaminophen] Other (See Comments)    Sped up heart and breathing   Other Other (See Comments)    Glaucoma eye drop - turned eyes dark      Review of Systems  Constitutional:  Positive for weight loss. Negative for chills, fever and malaise/fatigue.  HENT:  Negative for hearing loss, sore throat and tinnitus.   Eyes:  Negative for blurred vision and double vision.  Respiratory:  Negative for cough, hemoptysis, sputum production, shortness of breath, wheezing and stridor.   Cardiovascular:  Negative for chest pain, palpitations, orthopnea, leg swelling and PND.  Gastrointestinal:  Negative for abdominal pain, constipation, diarrhea, heartburn, nausea and vomiting.  Genitourinary:  Negative for dysuria, hematuria and urgency.  Musculoskeletal:  Negative for joint pain and myalgias.  Skin:  Negative for itching and rash.  Neurological:  Negative for dizziness, tingling, weakness and headaches.  Endo/Heme/Allergies:  Negative for environmental allergies. Does not bruise/bleed easily.  Psychiatric/Behavioral:  Negative for depression. The patient is not nervous/anxious and does not have insomnia.   All other systems reviewed and are negative.   Objective:  Physical Exam Vitals reviewed.  Constitutional:      General: She is not in acute distress.    Appearance: She is well-developed.     Comments: Frail, thin   HENT:     Head: Normocephalic and atraumatic.  Eyes:     General: No scleral icterus.     Conjunctiva/sclera: Conjunctivae normal.     Pupils: Pupils are equal, round, and reactive to light.  Neck:     Vascular: No JVD.     Trachea: No tracheal deviation.  Cardiovascular:     Rate and Rhythm: Normal rate and regular rhythm.     Heart sounds: Normal heart sounds. No murmur heard. Pulmonary:     Effort: Pulmonary effort is normal. No tachypnea, accessory muscle usage or respiratory distress.     Breath sounds: No stridor. No wheezing, rhonchi or rales.  Abdominal:     General: Bowel sounds are normal. There is no distension.     Tenderness: There is no abdominal tenderness.  Musculoskeletal:  General: No tenderness.     Cervical back: Neck supple.  Lymphadenopathy:     Cervical: No cervical adenopathy.  Skin:    General: Skin is warm and dry.     Capillary Refill: Capillary refill takes less than 2 seconds.     Findings: No rash.  Neurological:     Mental Status: She is alert and oriented to person, place, and time.  Psychiatric:        Behavior: Behavior normal.     Vitals:   04/21/21 0630  BP: 132/64  Pulse: 96  Resp: 18  Temp: 98.2 F (36.8 C)  TempSrc: Oral  SpO2: 98%  Weight: 53.5 kg  Height: '5\' 4"'  (1.626 m)   98% on RA BMI Readings from Last 3 Encounters:  04/21/21 20.25 kg/m  04/12/21 20.25 kg/m  04/07/21 20.32 kg/m   Wt Readings from Last 3 Encounters:  04/21/21 53.5 kg  04/12/21 53.5 kg  04/07/21 53.7 kg     CBC    Component Value Date/Time   WBC 1.6 (L) 04/20/2021 1118   WBC 3.4 (L) 03/22/2021 0820   RBC 3.52 (L) 04/20/2021 1118   HGB 9.5 (L) 04/21/2021 0717   HGB 9.4 (L) 04/20/2021 1118   HGB 11.6 07/03/2019 1153   HCT 28.0 (L) 04/21/2021 0717   HCT 36.0 07/03/2019 1153   PLT 92 (L) 04/20/2021 1118   PLT 291 07/03/2019 1153   MCV 82.1 04/20/2021 1118   MCV 81 07/03/2019 1153   MCH 26.7 04/20/2021 1118   MCHC 32.5 04/20/2021 1118   RDW 15.3 04/20/2021 1118   RDW 13.2 07/03/2019 1153   LYMPHSABS 0.4 (L) 04/20/2021  1118   MONOABS 0.3 04/20/2021 1118   EOSABS 0.0 04/20/2021 1118   BASOSABS 0.0 04/20/2021 1118     Chest Imaging:  IMPRESSION: 1. Unchanged treated mass of the right pulmonary apex. 2. Slight interval increase in a moderate, loculated right pleural effusion, with extensive pleural thickening and nodularity, as well as septal thickening and nodularity, as well as septal thickening and nodularity throughout the lungs, which is increasingly confluent. Very extensive nodularity and septal thickening of the left lung is likewise worsened compared to prior examination. Findings are consistent with worsened pleural and lymphangitic metastatic disease. 3. No significant change in prominent, previously FDG avid mediastinal lymph nodes. 4. Coronary artery disease.    Pulmonary Functions Testing Results: No flowsheet data found.  FeNO:   Pathology:   Echocardiogram:   Heart Catheterization:     Assessment & Plan:     ICD-10-CM   1. Lung nodule  R91.1    Added automatically from request for surgery [phone]     Known stage 4 disease, oncology requesting   Discussion:  70 yo FM, stage 4 lung cancer Stage IV adenocarcinoma of the lung  Plan: Discussed risk-benefit alternatives proceed with bronchoscopy. Oncology would like repeat biopsy due to chance of mutations within the tissue. We discussed the risk of bleeding and pneumothorax. Ac held No barriers to proceed.      Current Facility-Administered Medications:    acetaminophen (TYLENOL) tablet 1,000 mg, 1,000 mg, Oral, Once, Woodrum, Chelsey L, MD   lactated ringers infusion, , Intravenous, Continuous, Woodrum, Chelsey L, MD  Facility-Administered Medications Ordered in Other Encounters:    prochlorperazine (COMPAZINE) 10 MG tablet, , , ,    [name], DO [facility] 04/21/2021 7:28 AM

## 2021-04-21 NOTE — Transfer of Care (Signed)
Immediate Anesthesia Transfer of Care Note  Patient: Allison [name]  Procedure(s) Performed: VIDEO BRONCHOSCOPY WITH ENDOBRONCHIAL NAVIGATION (Bilateral) BRONCHIAL BRUSHINGS BRONCHIAL NEEDLE ASPIRATION BIOPSIES BRONCHIAL BIOPSIES CHEST TUBE INSERTION  Patient Location: PACU  Anesthesia Type:General  Level of Consciousness: awake and drowsy  Airway & Oxygen Therapy: Patient Spontanous Breathing and Patient connected to face mask oxygen  Post-op Assessment: Report given to RN, Post -op Vital signs reviewed and stable and Patient moving all extremities X 4  Post vital signs: Reviewed and stable  Last Vitals:  Vitals Value Taken Time  BP 139/76 04/21/21 1117  Temp 36.2 C 04/21/21 1117  Pulse 84 04/21/21 1122  Resp 23 04/21/21 1122  SpO2 100 % 04/21/21 1122  Vitals shown include unvalidated device data.  Last Pain:  Vitals:   04/21/21 0721  TempSrc:   PainSc: 4       Patients Stated Pain Goal: 2 (60/73/71 0626)  Complications: No notable events documented.

## 2021-04-21 NOTE — Progress Notes (Signed)
K+ 2.8.  Dr. Lanetta Inch aware, no new orders received.

## 2021-04-21 NOTE — Progress Notes (Signed)
PCCM Progress Note  Please seen full H&P by Dr. Valeta Harms dated 11/3   Allison [name] is a 70yo female with a PMH significant for stage IV adenocarcinoma of the lung, malignant pleural effusion, HTN, asthma, and prior stroke who presented 11/3 for planned EBUS for biopsy    Patient tolerated procedure well but was seen with pneumothorax on imagining post procedure. This resulted in pigtail chest tube placement per Dr. Valeta Harms.    Physical Exam  General: Pleasant elderly female lying in bed post anesthesia  HEENT: ETT, MM pink/moist, PERRL,  Neuro: Sleepy but able to awake and answer questions appropriately  CV: s1s2 regular rate and rhythm, no murmur, rubs, or gallops,  PULM:  Clear to ascultation bilaterally, no increased work of breathing, left chest tube in place  GI: soft, bowel sounds active in all 4 quadrants, non-tender, non-distended Extremities: warm/dry, no edema  Skin: no rashes or lesions   Assessment/Plan Post procedure pneumothorax  -Chest tube placed in PACU  Stage IV adenocarcinoma of the lung -Continue home [place] Malignant pleural effusion P: Routine chest tube care  Flush chest tube per protocol  Daily chest xray Follow biopsy results Ensure pain control to avoid splinting  Mobilize   Hx of HTN  P: Continue home medications  Continuous telemetry     Giavanni Odonovan D. Kenton Kingfisher, NP-C [facility] Personal contact information can be found on Amion  04/21/2021, 1:14 PM

## 2021-04-21 NOTE — Progress Notes (Signed)
Given recent lung biopsy will hold weight based Lovenox overnight with plans to observe ability to resume in AM.

## 2021-04-21 NOTE — Op Note (Signed)
Video Bronchoscopy with Robotic Assisted Bronchoscopic Navigation   Date of Operation: 04/21/2021   Pre-op Diagnosis: Lung mass, infiltrates  Post-op Diagnosis: Lung mass, infiltrate  Surgeon: [name], DO   Assistants: None   Anesthesia: General endotracheal anesthesia  Operation: Flexible video fiberoptic bronchoscopy with robotic assistance and biopsies.  Estimated Blood Loss: Minimal  Complications: None  Indications and History: Allison [name] is a 70 y.o. female with history of lung mass, infiltrates. The risks, benefits, complications, treatment options and expected outcomes were discussed with the patient.  The possibilities of pneumothorax, pneumonia, reaction to medication, pulmonary aspiration, perforation of a viscus, bleeding, failure to diagnose a condition and creating a complication requiring transfusion or operation were discussed with the patient who freely signed the consent.    Description of Procedure: The patient was seen in the Preoperative Area, was examined and was deemed appropriate to proceed.  The patient was taken to [facility] endoscopy room 3, identified as Allison [name] and the procedure verified as Flexible Video Fiberoptic Bronchoscopy.  A Time Out was held and the above information confirmed.   Prior to the date of the procedure a high-resolution CT scan of the chest was performed. Utilizing ION software program a virtual tracheobronchial tree was generated to allow the creation of distinct navigation pathways to the patient's parenchymal abnormalities. After being taken to the operating room general anesthesia was initiated and the patient  was orally intubated. The video fiberoptic bronchoscope was introduced via the endotracheal tube and a general inspection was performed which showed normal right and left lung anatomy, aspiration of the bilateral mainstems was completed to remove any remaining secretions. Robotic catheter inserted into patient's  endotracheal tube.   Target #1 RUL mass: The distinct navigation pathways prepared prior to this procedure were then utilized to navigate to patient's lesion identified on CT scan. The robotic catheter was secured into place and the vision probe was withdrawn.  Lesion location was approximated using fluoroscopy and radial endobronchial ultrasound for peripheral targeting. Under fluoroscopic guidance transbronchial needle brushings, transbronchial needle biopsies, and transbronchial forceps biopsies were performed to be sent for cytology and pathology.   Target #2 LUL Infiltrates: Using the robotic catheter we drove out into the left upper lobe.  Under direct fluoroscopic guidance we took a few biopsies with a forceps but I felt like these were too small specimens.  We then undocked the robot and utilized a standard therapeutic bronchoscope with a 2.8 mm [employer] forceps.  Random transbronchial in the left upper lobe were taken under direct fluoroscopic Guidance.  We also used cytology brushing for samples as well.  Additional transbronchial were sampled for culture.  A bronchioalveolar lavage was performed in the LUL and sent for microbiology.  At the end of the procedure a general airway inspection was performed and there was no evidence of active bleeding. The bronchoscope was removed.  The patient tolerated the procedure well. There was no significant blood loss and there were no obvious complications. A post-procedural chest x-ray is pending.  Samples Target #1: 1. Transbronchial needle brushings from right upper lobe 2. Transbronchial Wang needle biopsies from right upper lobe 3. Transbronchial forceps biopsies from right upper lobe  Samples Target #2: 1. Transbronchial brushings from left upper lobe 3. Transbronchial forceps biopsies from left upper lobe 4. Bronchoalveolar lavage from left upper lobe  Plans:  The patient will be discharged from the PACU to home when recovered from  anesthesia and after chest x-ray is reviewed.  We will review the cytology, pathology and microbiology results with the patient when they become available. Outpatient followup will be with Dr. Julien Nordmann.  [name], DO [facility] 04/21/2021 10:32 AM

## 2021-04-21 NOTE — Progress Notes (Signed)
Patient arrived to [place] room 19. Alert and oriented x4. Bed in lowest position. Call light in reach. Chest tube connected to suction

## 2021-04-22 ENCOUNTER — Encounter: Payer: Self-pay | Admitting: Internal Medicine

## 2021-04-22 ENCOUNTER — Telehealth: Payer: Self-pay | Admitting: Medical Oncology

## 2021-04-22 ENCOUNTER — Inpatient Hospital Stay (HOSPITAL_COMMUNITY): Payer: Medicare HMO

## 2021-04-22 DIAGNOSIS — J95811 Postprocedural pneumothorax: Principal | ICD-10-CM

## 2021-04-22 DIAGNOSIS — R911 Solitary pulmonary nodule: Secondary | ICD-10-CM | POA: Diagnosis not present

## 2021-04-22 DIAGNOSIS — C3491 Malignant neoplasm of unspecified part of right bronchus or lung: Secondary | ICD-10-CM | POA: Diagnosis not present

## 2021-04-22 LAB — BASIC METABOLIC PANEL
Anion gap: 9 (ref 5–15)
BUN: 10 mg/dL (ref 8–23)
CO2: 26 mmol/L (ref 22–32)
Calcium: 9.4 mg/dL (ref 8.9–10.3)
Chloride: 98 mmol/L (ref 98–111)
Creatinine, Ser: 1.27 mg/dL — ABNORMAL HIGH (ref 0.44–1.00)
GFR, Estimated: 45 mL/min — ABNORMAL LOW (ref >60)
Glucose, Bld: 131 mg/dL — ABNORMAL HIGH (ref 70–99)
Potassium: 3.1 mmol/L — ABNORMAL LOW (ref 3.5–5.1)
Sodium: 133 mmol/L — ABNORMAL LOW (ref 135–145)

## 2021-04-22 LAB — ACID FAST SMEAR (AFB, MYCOBACTERIA)
Acid Fast Smear: NEGATIVE
Acid Fast Smear: NEGATIVE

## 2021-04-22 LAB — CYTOLOGY - NON PAP

## 2021-04-22 MED ORDER — ENOXAPARIN SODIUM 60 MG/0.6ML IJ SOSY
60.0000 mg | PREFILLED_SYRINGE | Freq: Two times a day (BID) | INTRAMUSCULAR | Status: DC
  Administered 2021-04-22 – 2021-04-24 (×4): 60 mg via SUBCUTANEOUS
  Filled 2021-04-22 (×4): qty 0.6

## 2021-04-22 MED ORDER — ACETAMINOPHEN 325 MG PO TABS
650.0000 mg | ORAL_TABLET | ORAL | Status: DC | PRN
  Administered 2021-04-22: 650 mg via ORAL
  Filled 2021-04-22 (×2): qty 2

## 2021-04-22 MED ORDER — FENTANYL CITRATE PF 50 MCG/ML IJ SOSY: 50.0000 ug | PREFILLED_SYRINGE | INTRAMUSCULAR | Status: DC | PRN

## 2021-04-22 MED ORDER — GUAIFENESIN ER 600 MG PO TB12
1200.0000 mg | ORAL_TABLET | Freq: Two times a day (BID) | ORAL | Status: DC
  Administered 2021-04-22 – 2021-04-26 (×6): 1200 mg via ORAL
  Filled 2021-04-22 (×6): qty 2

## 2021-04-22 MED ORDER — POTASSIUM CHLORIDE CRYS ER 20 MEQ PO TBCR
40.0000 meq | EXTENDED_RELEASE_TABLET | Freq: Once | ORAL | Status: AC
Start: 2021-04-22 — End: 2021-04-22
  Administered 2021-04-22: 40 meq via ORAL
  Filled 2021-04-22: qty 2

## 2021-04-22 MED ORDER — IPRATROPIUM-ALBUTEROL 0.5-2.5 (3) MG/3ML IN SOLN
3.0000 mL | Freq: Four times a day (QID) | RESPIRATORY_TRACT | Status: DC
  Administered 2021-04-22 (×3): 3 mL via RESPIRATORY_TRACT
  Filled 2021-04-22 (×4): qty 3

## 2021-04-22 NOTE — Telephone Encounter (Signed)
Spoke to dtr. She said Allison [name] was admitted to hospital yesterday for  "lung dropped " .She has a chest tube now.   Hypokalemia-  She said [name] does not take potassium supplements at home and said she thinks she got potassium last night in the hospital.

## 2021-04-22 NOTE — Progress Notes (Signed)
[place] Progress Note Patient Name: Allison [name] DOB: Jan 18, 1951 MRN: [id number]   Date of Service  04/22/2021  HPI/Events of Note  Patient has had prn Oxy IR x2 and fentanyl x1 for c/o pain to chest/back/surgical site, minimal effects noted.   eICU Interventions  Plan: Increase Fentanyl dose to 50-100 mcg IV Q 2 hours PRN pain.      Intervention Category Major Interventions: Other:  Lysle Dingwall 04/22/2021, 2:52 AM

## 2021-04-22 NOTE — Progress Notes (Signed)
Fentanyl IV given x1, Oxy IR given x2 with minimal effects noted. Pain reported ranging in 9-10/10 to left chest/back/surgical site pain, worsening with activity. Pt resting in brief intervals. Refused labs this am, despite encouragement and education of importance of labwork. Pt's daughter is at bedside as well. Dr.Sommer made aware of the above via secure chat, will reevaluate pain medications.

## 2021-04-22 NOTE — Anesthesia Postprocedure Evaluation (Signed)
Anesthesia Post Note  Patient: KASHEENA SAMBRANO  Procedure(s) Performed: VIDEO BRONCHOSCOPY WITH ENDOBRONCHIAL NAVIGATION (Bilateral) BRONCHIAL BRUSHINGS BRONCHIAL NEEDLE ASPIRATION BIOPSIES BRONCHIAL BIOPSIES CHEST TUBE INSERTION     Patient location during evaluation: PACU Anesthesia Type: General Level of consciousness: awake and alert Pain management: pain level controlled Vital Signs Assessment: post-procedure vital signs reviewed and stable Respiratory status: spontaneous breathing, nonlabored ventilation, respiratory function stable and patient connected to nasal cannula oxygen Cardiovascular status: blood pressure returned to baseline and stable Postop Assessment: no apparent nausea or vomiting Anesthetic complications: no   No notable events documented.  Last Vitals:  Vitals:   04/22/21 0149 04/22/21 0548  BP: (!) 152/75 138/64  Pulse: 98 88  Resp: 17 18  Temp: 37.3 C 37.3 C  SpO2: 99% 100%    Last Pain:  Vitals:   04/22/21 0548  TempSrc: Oral  PainSc:                  Ryhanna Dunsmore L Kayci [place]

## 2021-04-22 NOTE — Telephone Encounter (Addendum)
-----   Message from Curt Bears, MD sent at 04/22/2021 11:08 AM EDT ----- Please make sure she is taking potassium supplement. ----- Message ----- From: Buel Ream, Lab In [place] Sent: 04/20/2021  11:26 AM EDT To: Curt Bears, MD

## 2021-04-22 NOTE — Progress Notes (Signed)
NAME:  Allison [name], MRN:  [id number], DOB:  19-Aug-1950, LOS: 1 ADMISSION DATE:  04/21/2021, CONSULTATION DATE:  11/3 REFERRING MD:  Valeta Harms, CHIEF COMPLAINT:  Post bronchoscopy pneumothorax   History of Present Illness:  70 y/o female with a complicated past medical history admitted after a post bronchoscopy pneumothorax.  Pertinent  Medical History  Stage IV adenocarcinoma of lung with malignant effusion, history of pleur-x; Rx chemo-rad right lung Anxiety DVT/PE 2022 Stroke 2022, felt to be eliquis failure GERD Hypertension Depression  Significant Hospital Events: Including procedures, antibiotic start and stop dates in addition to other pertinent events   11/3 robotic biopsy right lung nodule, left upper lobe infiltrate worrisome for lymphangitic spread; complicated by left pneumothorax, chest tube placed Complains of a lot of pain in the left chest, has air leak  Interim History / Subjective:  Complains of a lot of pain in the left chest and back, worse with deep breath and movement, has air leak Anxiety has been a problem Wants an alternative to lovenox Can't cough up mucus due to pain  Objective   Blood pressure 138/64, pulse 88, temperature 99.2 F (37.3 C), temperature source Oral, resp. rate 18, height 5\' 4"  (1.626 m), weight 54.2 kg, SpO2 100 %.        Intake/Output Summary (Last 24 hours) at 04/22/2021 0941 Last data filed at 04/21/2021 2200 Gross per 24 hour  Intake 520 ml  Output 10 ml  Net 510 ml   Filed Weights   04/21/21 0630 04/22/21 0519  Weight: 53.5 kg 54.2 kg    Examination: General:  Frail, resting comfortably in bed HENT: NCAT OP clear PULM: CTA B, normal effort CV: RRR, no mgr GI: BS+, soft, nontender MSK: normal bulk and tone Neuro: awake, alert, no distress, MAEW   Resolved Hospital Problem list     Assessment & Plan:  Left pneumothorax post bronchoscopy> still has air leak Maintain chest tube to suction Repeat CXR in AM  Chest  congestion: treated with nebulized albuterol at home Try duoneb qid here, could probably change to PRN tomorrow if chest congestion better Add mucinex  Chest pain: pleuritic, related to malignancy/pneumothorax Would like to add low dose NSAID for relief of pleural pain, but not sure what her kidney function is doing Check BMET Continue oxycodone prn Add APAP prn  Hypertension Continue homd coreg, amlodipine, imdur, chlorthalidone  DVT/PE and stroke history Add lovenox back Ask pharmacy if there is an oral option (needle phobia), pradaxa?  Anxiety, general dysphoria with progression of her condition and recurrent complications Palliative care consult per family/patient request  Best Practice (right click and "Reselect all SmartList Selections" daily)   Diet/type: Regular consistency (see orders) DVT prophylaxis: systemic dose LMWH GI prophylaxis: N/A Lines: N/A Foley:  N/A Code Status:  full code Last date of multidisciplinary goals of care discussion [11/3]  Labs   CBC: Recent Labs  Lab 04/20/21 1118 04/21/21 0717  WBC 1.6*  --   NEUTROABS 0.9*  --   HGB 9.4* 9.5*  HCT 28.9* 28.0*  MCV 82.1  --   PLT 92*  --     Basic Metabolic Panel: Recent Labs  Lab 04/20/21 1118 04/21/21 0717  NA 140 136  K 2.9* 2.8*  CL 100 97*  CO2 27  --   GLUCOSE 103* 105*  BUN 11 10  CREATININE 0.94 1.20*  CALCIUM 9.7  --    GFR: Estimated Creatinine Clearance: 37.3 mL/min (A) (by C-G formula based on  SCr of 1.2 mg/dL (H)). Recent Labs  Lab 04/20/21 1118  WBC 1.6*    Liver Function Tests: Recent Labs  Lab 04/20/21 1118  AST 37  ALT 52*  ALKPHOS 76  BILITOT <0.2*  PROT 7.2  ALBUMIN 2.7*   No results for input(s): LIPASE, AMYLASE in the last 168 hours. No results for input(s): AMMONIA in the last 168 hours.  ABG    Component Value Date/Time   HCO3 23.4 02/07/2021 2100   TCO2 26 04/21/2021 0717   ACIDBASEDEF 0.2 02/07/2021 2100   O2SAT 76.1 02/07/2021 2100      Coagulation Profile: No results for input(s): INR, PROTIME in the last 168 hours.  Cardiac Enzymes: No results for input(s): CKTOTAL, CKMB, CKMBINDEX, TROPONINI in the last 168 hours.  HbA1C: Hgb A1c MFr Bld  Date/Time Value Ref Range Status  02/12/2021 05:59 AM 5.7 (H) 4.8 - 5.6 % Final    Comment:    (NOTE) Pre diabetes:          5.7%-6.4%  Diabetes:              >6.4%  Glycemic control for   <7.0% adults with diabetes   06/13/2019 12:41 AM 5.5 4.8 - 5.6 % Final    Comment:    (NOTE) Pre diabetes:          5.7%-6.4% Diabetes:              >6.4% Glycemic control for   <7.0% adults with diabetes     CBG: Recent Labs  Lab 04/21/21 0631  GLUCAP 94    Critical care time: n/a    > 35 minutes spent in direct consultation with the patient's family and with the patient and reviewing her record and coordinating care today  Roselie Awkward, MD [facility] Pager: [phone] Cell: [phone] After 7:00 pm call Elink  [phone]

## 2021-04-23 ENCOUNTER — Inpatient Hospital Stay (HOSPITAL_COMMUNITY): Payer: Medicare HMO

## 2021-04-23 DIAGNOSIS — Z7189 Other specified counseling: Secondary | ICD-10-CM | POA: Diagnosis not present

## 2021-04-23 DIAGNOSIS — J939 Pneumothorax, unspecified: Secondary | ICD-10-CM | POA: Diagnosis not present

## 2021-04-23 DIAGNOSIS — Z66 Do not resuscitate: Secondary | ICD-10-CM

## 2021-04-23 DIAGNOSIS — Z515 Encounter for palliative care: Secondary | ICD-10-CM

## 2021-04-23 DIAGNOSIS — C3491 Malignant neoplasm of unspecified part of right bronchus or lung: Secondary | ICD-10-CM | POA: Diagnosis not present

## 2021-04-23 DIAGNOSIS — Z9689 Presence of other specified functional implants: Secondary | ICD-10-CM | POA: Diagnosis not present

## 2021-04-23 LAB — BASIC METABOLIC PANEL
Anion gap: 9 (ref 5–15)
BUN: 8 mg/dL (ref 8–23)
CO2: 25 mmol/L (ref 22–32)
Calcium: 8.9 mg/dL (ref 8.9–10.3)
Chloride: 102 mmol/L (ref 98–111)
Creatinine, Ser: 0.9 mg/dL (ref 0.44–1.00)
GFR, Estimated: >60 mL/min (ref >60)
Glucose, Bld: 95 mg/dL (ref 70–99)
Potassium: 3.4 mmol/L — ABNORMAL LOW (ref 3.5–5.1)
Sodium: 136 mmol/L (ref 135–145)

## 2021-04-23 LAB — CULTURE, BAL-QUANTITATIVE W GRAM STAIN: Culture: 6000 — AB

## 2021-04-23 MED ORDER — FLUOXETINE HCL 10 MG PO CAPS
10.0000 mg | ORAL_CAPSULE | Freq: Every day | ORAL | Status: DC
  Administered 2021-04-23 – 2021-04-26 (×3): 10 mg via ORAL
  Filled 2021-04-23 (×4): qty 1

## 2021-04-23 MED ORDER — OXYCODONE HCL ER 10 MG PO T12A
10.0000 mg | EXTENDED_RELEASE_TABLET | Freq: Two times a day (BID) | ORAL | Status: DC
  Administered 2021-04-23 – 2021-04-26 (×7): 10 mg via ORAL
  Filled 2021-04-23 (×7): qty 1

## 2021-04-23 MED ORDER — POLYETHYLENE GLYCOL 3350 17 G PO PACK
17.0000 g | PACK | Freq: Every day | ORAL | Status: DC
  Filled 2021-04-23 (×2): qty 1

## 2021-04-23 MED ORDER — POTASSIUM CHLORIDE 20 MEQ PO PACK
40.0000 meq | PACK | Freq: Once | ORAL | Status: AC
  Administered 2021-04-23: 40 meq via ORAL
  Filled 2021-04-23: qty 2

## 2021-04-23 MED ORDER — LIDOCAINE 5 % EX PTCH
1.0000 | MEDICATED_PATCH | CUTANEOUS | Status: DC
  Administered 2021-04-23 – 2021-04-25 (×3): 1 via TRANSDERMAL
  Filled 2021-04-23 (×3): qty 1

## 2021-04-23 MED ORDER — IPRATROPIUM-ALBUTEROL 0.5-2.5 (3) MG/3ML IN SOLN
3.0000 mL | Freq: Four times a day (QID) | RESPIRATORY_TRACT | Status: DC | PRN
  Administered 2021-04-23 (×2): 3 mL via RESPIRATORY_TRACT
  Filled 2021-04-23 (×2): qty 3

## 2021-04-23 NOTE — Progress Notes (Signed)
NAME:  Allison [name], MRN:  [id number], DOB:  Nov 09, 1950, LOS: 2 ADMISSION DATE:  04/21/2021, CONSULTATION DATE:  11/3 REFERRING MD:  Valeta Harms, CHIEF COMPLAINT:  Post bronchoscopy pneumothorax   History of Present Illness:  70 y/o female with a complicated past medical history admitted after a post bronchoscopy pneumothorax.  Pertinent  Medical History  Stage IV adenocarcinoma of lung with malignant effusion, history of pleur-x; Rx chemo-rad right lung Anxiety DVT/PE 2022 Stroke 2022, felt to be eliquis failure GERD Hypertension Depression  Significant Hospital Events: Including procedures, antibiotic start and stop dates in addition to other pertinent events   11/3 robotic biopsy right lung nodule, left upper lobe infiltrate worrisome for lymphangitic spread; complicated by left pneumothorax, chest tube placed Complains of a lot of pain in the left chest, has air leak  Interim History / Subjective:  Still having a lot of pain with inspiration Still air leak present on inspiration Has needle phobia.  Sister at bedside   Objective   Blood pressure (!) 157/78, pulse 80, temperature 98.4 F (36.9 C), temperature source Oral, resp. rate 17, height 5\' 4"  (1.626 m), weight 54.7 kg, SpO2 99 %.        Intake/Output Summary (Last 24 hours) at 04/23/2021 1407 Last data filed at 04/23/2021 0358 Gross per 24 hour  Intake 240 ml  Output 20 ml  Net 220 ml   Filed Weights   04/21/21 0630 04/22/21 0519 04/23/21 0357  Weight: 53.5 kg 54.2 kg 54.7 kg    Examination: Frail, on room air No increased work of breathing Breath sounds diminished right lung base, otherwise no wheezes or crackles RRR no mrg  Resolved Hospital Problem list     Assessment & Plan:  Left pneumothorax post bronchoscopy> still has air leak Chest xray from this morning shows small sliver of left apical ptx maybe slightly worse than yesterday.  This is clinically a very small ptx and likely to resolve  spontaneously at this point. Will place to waterseal and repeat a chest xray in am Pain control with oxycodone. She is not wanting to take nsaids. Will try a lidocaine patch and see if this helps  Chest congestion: treated with nebulized albuterol at home Continue mucinex and duonebs  Chest pain: pleuritic, related to malignancy/pneumothorax Would like to add low dose NSAID for relief of pleural pain, but not sure what her kidney function is doing Check BMET Continue oxycodone prn Add APAP prn  Hypertension Continue homd coreg, amlodipine, imdur, chlorthalidone  DVT/PE and stroke history Continue lovenox  Anxiety, general dysphoria with progression of her condition and recurrent complications Palliative care consult per family/patient request  Hypokalemia - replace with potassium  Best Practice (right click and "Reselect all SmartList Selections" daily)   Diet/type: Regular consistency (see orders) DVT prophylaxis: systemic dose LMWH GI prophylaxis: N/A Lines: N/A Foley:  N/A Code Status:  full code  I will see her tomorrow after chest xray. If stable will pull chest tube. Pain and symptom management still an issue. She might need another anticoagulant due to fear of needles.  Will signout to [facility] to assume care and PCCM will continue to follow for chest tube.   Lenice Llamas, MD [facility] [place]   CBC: Recent Labs  Lab 04/20/21 1118 04/21/21 0717  WBC 1.6*  --   NEUTROABS 0.9*  --   HGB 9.4* 9.5*  HCT 28.9* 28.0*  MCV 82.1  --   PLT 92*  --  Basic Metabolic Panel: Recent Labs  Lab 04/20/21 1118 04/21/21 0717 04/22/21 0906 04/23/21 0140  NA 140 136 133* 136  K 2.9* 2.8* 3.1* 3.4*  CL 100 97* 98 102  CO2 27  --  26 25  GLUCOSE 103* 105* 131* 95  BUN 11 10 10 8   CREATININE 0.94 1.20* 1.27* 0.90  CALCIUM 9.7  --  9.4 8.9   GFR: Estimated Creatinine Clearance: 50.2 mL/min (by C-G formula based on SCr of  0.9 mg/dL). Recent Labs  Lab 04/20/21 1118  WBC 1.6*    Liver Function Tests: Recent Labs  Lab 04/20/21 1118  AST 37  ALT 52*  ALKPHOS 76  BILITOT <0.2*  PROT 7.2  ALBUMIN 2.7*   No results for input(s): LIPASE, AMYLASE in the last 168 hours. No results for input(s): AMMONIA in the last 168 hours.  ABG    Component Value Date/Time   HCO3 23.4 02/07/2021 2100   TCO2 26 04/21/2021 0717   ACIDBASEDEF 0.2 02/07/2021 2100   O2SAT 76.1 02/07/2021 2100     Coagulation Profile: No results for input(s): INR, PROTIME in the last 168 hours.  Cardiac Enzymes: No results for input(s): CKTOTAL, CKMB, CKMBINDEX, TROPONINI in the last 168 hours.  HbA1C: Hgb A1c MFr Bld  Date/Time Value Ref Range Status  02/12/2021 05:59 AM 5.7 (H) 4.8 - 5.6 % Final    Comment:    (NOTE) Pre diabetes:          5.7%-6.4%  Diabetes:              >6.4%  Glycemic control for   <7.0% adults with diabetes   06/13/2019 12:41 AM 5.5 4.8 - 5.6 % Final    Comment:    (NOTE) Pre diabetes:          5.7%-6.4% Diabetes:              >6.4% Glycemic control for   <7.0% adults with diabetes     CBG: Recent Labs  Lab 04/21/21 0631  GLUCAP 94

## 2021-04-23 NOTE — Consult Note (Signed)
Palliative Medicine Inpatient Consult Note  Consulting Provider: [name], MD  Reason for consult:   Palliative Care Consult Services Palliative Medicine Consult   Symptom Management Consult  Reason for Consult? advanced lung cancer, sees Allison [name] for palliative outpatient service. in with complication of bronchoscopy, has a lot of anxiety, conern with general direction of her condition.   HPI:  Per intake H&P --> Allison [name] is a 70 year old female, past medical history of stage IV adenocarcinoma of the lung.  Was initially diagnosed with a pleural effusion in July 2019.  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  Initially had a Pleurx catheter placed.  Had palliative radiotherapy and radiation as well as placement on Tagrisso.  Overall doing well for treatment.  Unfortunately CT imaging revealed progression of disease.  Has started chemotherapy with Dr. Earlie [name].  CT scan of the chest on 03/04/2021 was completed.  She had evidence of septal thickening nodularity extensive pleural thickening loculated right effusion concern for lymphangitic spread. Oncology requested a repeat biopsy w/ broncoscopy for further workup of possible mutations.   Palliative care has been asked to get involved to further address symptom burden in the setting of advanced disease. Allison [name] had been seen in August of this year inpatient by Allison [name].   Patient is follow outpatient by Allison [name] Allison [name].  Clinical Assessment/Goals of Care:  *Please note that this is a verbal dictation therefore any spelling or grammatical errors are due to the "Allison [name]" system interpretation.  I have reviewed medical records including EPIC notes, labs and imaging, received report from bedside RN, assessed the patient who is lying in bed in mild distress from generalize pain    I met with Allison [name] and her daughter, Allison [name] to further discuss diagnosis  prognosis, GOC, EOL wishes, disposition and options.  We reviewed Allison [name]'s past medical history. She shares with me that she was in fairly good health until 2019 when she had an unremitting cough for > 2 weeks. Upon her check up there were some abnormalities and she was later identified to have Stg IV adenocarcinoma of the lung. She shares that this was all a "shock" as she was never a smoker and maintained a healthy lifestyle. She expresses that up until recently her disease had been under fair control with the "Tagrisso".   In terms of presently, Allison [name] expresses that she was admitted to have a bronchoscopy to further identify what the genetics of her cancer are to know if her treatment needs to be modified further. Unfortunately she now has a pneumothorax requiring a chest tube. She shares that she has a Pleurex years ago for about nine months and that this is far more painful.    I introduced Palliative Medicine as specialized medical care for people living with serious illness. It focuses on providing relief from the symptoms and stress of a serious illness. The goal is to improve quality of life for both the patient and the family.  Allison [name] shares that she is from [name], [name]. She has a partner, Allison [name]. She has three children who all live within the  area. She has five grandchildren. Allison [name] worked for [employer] for 43 years. She is a woman who enjoys being active in the cancer community. She is a member of various groups to aid in better understanding why cancer has affected the community as it has. She expresses that in many of these groups  that she is the minority which is sad to her.   Prior to hospitalization, Allison [name] has received around the clock care from her daughters. They take shifts. She has been requiring more help recently per self report. She is able to mobilize with a cane.    A detailed discussion was had today regarding advanced directives, these are on file in  Allison [name].    Concepts specific to code status, artifical feeding and hydration, continued IV antibiotics and rehospitalization was had. MOST form completed with Dr. Mariea [name] on 03/01/21 and reviewed as below:  Cardiopulmonary Resuscitation: Do Not Attempt Resuscitation (DNR/No CPR)  Medical Interventions: Limited Additional Interventions: Use medical treatment, IV fluids and cardiac monitoring as indicated, DO NOT USE intubation or mechanical ventilation. May consider use of less invasive airway support such as BiPAP or CPAP. Also provide comfort measures. Transfer to the hospital if indicated. Avoid intensive care.   Antibiotics: Antibiotics if indicated  IV Fluids: IV fluids for a defined trial period  Feeding Tube: Feeding tube for a defined trial period   A complete discussion of symptoms was held as below:  1.) Anxiety - Has been taking xanax 0.$RemoveBefore'25mg'tUuxTtNbNvguB$  PO BID which does not seem to be doing anything. Refers to constant "flutters" most especially in the setting of getting needle sticks. Has a portacath but has not been able to use this d/t recurrent bleeding. Patient was on prozac many years ago which did help anxiety. She shares willingness to try this again.   2.) Pain - Not [name] cental location constant and severe intensity. Oxycodone helps but only for a short time. Discussed starting a long acting OxyContin which she seems to be amenable towards. Does not have a true allergy to codeine.  3.) Insomnia - Takes [name] 0.$RemoveBef'25mg'OTjXNLeuVU$  of xanax and 1g of tylenol at night which enable her to get 4-5 hours of solid sleep. Had prior "maxed out dosing" of melatonin.  4.) Phlegm - In the morning has "a large" mucous plug it seems that needs to be expectorated in order for Allison [name] to feel that she can breath efficiently. She is of the last two weeks receiving duonebs which has helps tremendously.  5.) Bowel Management - Reviewed the importance of regular stools in the setting of pain medication. Plan to start miralax 17gm  daily. Otherwise patient shares irregular Bms since admission. LBM on 11/3.  6.) FTT - Patient see's dietary at the oncology clinic though complains of everything "tasting sour". She shares when she has a craving to act on  it as these last for only a short time. She previously had been on steroids for another reason which increased her appetite though she has been tapered off of those. She is on marinol which is not noted to be helping her. She shares lactose intolerance and fear of drinking even the lactose friendly variations of things as they "travel through her".  Jernee is open to having our IP dietician meet with her. Otherwise if the primary team agrees a low dose PRN steroid may help.   7.) Activity Intolerance - This appears to be in the setting of disease burden. She has a hard time taking long walks as she gets fatigued and short of breath fairly quickly.   8.) Spiritual Support - Patient is a [employer]. She does not have a large congregation and her Doristine Bosworth is fairly ned. She shares that she can receive spiritual support through a member of [name] of her cancer support groups.  9.) Emotional  Support - Saga is a member of many Zoom support groups. She shares that she has been looking on her own for a psychologist though has not yet found [name]. She would be interested in a referral if we can coordinate this.   We reviewed the need for ongoing support and symptom management. Loye is open to the idea of coming into the Oncology/Palliative symptom management clinic moving forward.  Discussed the importance of continued conversation with family and their  medical providers regarding overall plan of care and treatment options, ensuring decisions are within the context of the patients values and GOCs.  Decision Maker: Allison [name] (Daughter) [phone]  SUMMARY OF RECOMMENDATIONS   DNAR/DNI  Will refer to outpatient Palliative Oncology symptom management clinic --> inbox message sent  to [facility] and Gabriel Rung  Patient is interested in speaking to Psychology, will refer to Dr. Elias Else --> Email send to he and Dr. Julien Nordmann for a formal referral  Symptom management as below  Code Status/Advance Care Planning: DNAR/DNI   Symptom Management:  Generalized Anxiety: - Was on Prozac years ago which worked well for here, we will start at lowest dose and tutrate up as able - Xanax 0.$RemoveB'25mg'EiYXWgOl$  PO BID PRN  Malignancy related pain: - Oxycodone $RemoveBef'5mg'gApoxYTdjZ$  PO Q4H PRN - Will start Oxycontin $RemoveBeforeD'10mg'FiarDhPUSJQbtq$  PO BID - Fentanyl 50-122mcg Q2H PRN breakthrough pain (not using d/t poor functioning IV)  Insomnia: - Present regiment of xanax and tylenol is working - Discussed moving forward the likelihood of modify  this further  Phlegm: - Continue Duonebs Q6H PRN  Bowel Management: - Miralax 17gm Qday  Failure to Thrive: - Dietician consult - On Marinol BID, not really helping - Consider low dose steroid (decadron $RemoveBeforeDEI'1mg'FiAVvgqmtvDfoxWE$  PO) PRN BID for appetite stimulation if Pulm and Oncology are in agreement  Activity Intolerance: - PT/OT  Emotional Support: - Therapeutic Listening - A member of multiple support groups through [place]  - Referral to Dr. Elias Else  Spiritual Support: -  Rashea is [employer]   Palliative Prophylaxis:  Oral Care, Mobility  Additional Recommendations (Limitations, Scope, Preferences): Continue to treat what is treable   Psycho-social/Spiritual:  Desire for further Chaplaincy support: Declined Additional Recommendations: Education on symptom support   Prognosis: (+) 3 admissions in 6 months is worrisome. Awaiting pathology results for further prognostication.   Discharge Planning: Likely home when medically optimized.   Vitals:   04/22/21 2040 04/23/21 0357  BP:  138/73  Pulse: 95 75  Resp: 18 18  Temp:  97.8 F (36.6 C)  SpO2: 99% 99%    Intake/Output Summary (Last 24 hours) at 04/23/2021 0644 Last data filed at 04/23/2021 0358 Gross per 24 hour   Intake 560 ml  Output 20 ml  Net 540 ml   Last Weight  Most recent update: 04/23/2021  4:01 AM    Weight  54.7 kg (120 lb 9.5 oz)            Gen:  Frail elderly AA F in NAD HEENT: moist mucous membranes CV: Regular rate and rhythm  PULM: On RA, (+) L sided chest tube ABD: soft/nontender  EXT: No edema  Neuro: Alert and oriented x3   PPS: 50%   This conversation/these recommendations were discussed with patient primary care team, Dr. Lake Bells  Time In: 1200 Time Out: 1420 Total Time: 140 Greater than 50%  of this time was spent counseling and coordinating care related to the above assessment and plan.  [facility]  Medicine Team Team Cell Phone: [phone] Please utilize secure chat with additional questions, if there is no response within 30 minutes please call the above phone number  Palliative Medicine Team providers are available by phone from 7am to 7pm daily and can be reached through the team cell phone.  Should this patient require assistance outside of these hours, please call the patient's attending physician.

## 2021-04-24 ENCOUNTER — Inpatient Hospital Stay (HOSPITAL_COMMUNITY): Payer: Medicare HMO

## 2021-04-24 ENCOUNTER — Encounter (HOSPITAL_COMMUNITY): Payer: Self-pay | Admitting: Pulmonary Disease

## 2021-04-24 DIAGNOSIS — Z7189 Other specified counseling: Secondary | ICD-10-CM | POA: Diagnosis not present

## 2021-04-24 DIAGNOSIS — Z515 Encounter for palliative care: Secondary | ICD-10-CM | POA: Diagnosis not present

## 2021-04-24 DIAGNOSIS — C3491 Malignant neoplasm of unspecified part of right bronchus or lung: Secondary | ICD-10-CM | POA: Diagnosis not present

## 2021-04-24 DIAGNOSIS — Z419 Encounter for procedure for purposes other than remedying health state, unspecified: Secondary | ICD-10-CM | POA: Diagnosis not present

## 2021-04-24 DIAGNOSIS — Z9889 Other specified postprocedural states: Secondary | ICD-10-CM

## 2021-04-24 DIAGNOSIS — R911 Solitary pulmonary nodule: Secondary | ICD-10-CM | POA: Diagnosis not present

## 2021-04-24 MED ORDER — ALPRAZOLAM 0.25 MG PO TABS
0.2500 mg | ORAL_TABLET | Freq: Three times a day (TID) | ORAL | Status: DC | PRN
  Administered 2021-04-24 – 2021-04-26 (×3): 0.25 mg via ORAL
  Filled 2021-04-24 (×3): qty 1

## 2021-04-24 MED ORDER — CYCLOBENZAPRINE HCL 5 MG PO TABS
5.0000 mg | ORAL_TABLET | Freq: Every day | ORAL | Status: DC
Start: 2021-04-24 — End: 2021-04-26
  Administered 2021-04-24: 5 mg via ORAL
  Filled 2021-04-24: qty 1

## 2021-04-24 MED ORDER — OXYCODONE HCL 5 MG PO TABS: 5.0000 mg | ORAL_TABLET | ORAL | Status: DC | PRN

## 2021-04-24 MED ORDER — DEXAMETHASONE 0.5 MG PO TABS
1.0000 mg | ORAL_TABLET | Freq: Two times a day (BID) | ORAL | Status: DC | PRN
  Filled 2021-04-24: qty 2

## 2021-04-24 MED ORDER — SENNA 8.6 MG PO TABS
1.0000 | ORAL_TABLET | Freq: Every day | ORAL | Status: DC
  Filled 2021-04-24: qty 1

## 2021-04-24 MED ORDER — IBUPROFEN 600 MG PO TABS
600.0000 mg | ORAL_TABLET | Freq: Once | ORAL | Status: DC
  Filled 2021-04-24: qty 1

## 2021-04-24 MED ORDER — OXYCODONE HCL 5 MG PO TABS
10.0000 mg | ORAL_TABLET | ORAL | Status: DC | PRN
  Administered 2021-04-24 – 2021-04-25 (×2): 10 mg via ORAL
  Filled 2021-04-24 (×2): qty 2

## 2021-04-24 MED ORDER — POTASSIUM CHLORIDE CRYS ER 20 MEQ PO TBCR
40.0000 meq | EXTENDED_RELEASE_TABLET | Freq: Once | ORAL | Status: AC
  Administered 2021-04-24: 40 meq via ORAL
  Filled 2021-04-24: qty 2

## 2021-04-24 NOTE — Progress Notes (Addendum)
PROGRESS NOTE    Allison [name]  ZOX:[id number] DOB: 1951/02/09 DOA: 04/21/2021 PCP: [name], MD   Brief Narrative: 70 year old with past medical history significant for stage IV adenocarcinoma of the lung with malignant effusion, history of Pleur-X, chemo and radiation to the right lung, DVT PE 2022, a stroke 2022(felt to be Eliquis failure), hypertension, depression admitted after a  post bronchoscopy pneumothorax   Assessment & Plan:   Active Problems:   Adenocarcinoma of right lung, stage 4 (HCC)   Lung nodule   Pneumothorax  1-Left pneumothorax post bronchoscopy: -Patient had a repeated CT 9/16, evidence of septal thickening nodularity extensive pleural thickening, loculated right effusion concern for lymphangitic spread.  Oncology requested a repeat biopsy with bronchoscopy for further work-up of possible mutations. -Post bronchoscopy develop left pneumothorax. Chest pain: Pleuritic related to malignancy pneumothorax: CCM managing chest tube.  Chest x ray: Pneumothorax increased to 15 %, chest pain back on suctioning. .  -Continue with guaifenesin  Hypertension: Continue with carvedilol, chlorthalidone.  DVT/PE and stroke history: Continue with Lovenox. Plan to transition to eliquis at DC.   Anxiety, general dysphoria with progression of her condition and recurrent complication, Pain management.  Palliative care consulted Appreciate palliative care with symptoms management with Mrs Vezina.  Continue with the lidocaine patch, Xanax as needed. Decadron 1 tablet twice daily as needed for appetite stimulant  Continue with OxyContin and oxycodone as needed  Hypokalemia: Replete orally.    Estimated body mass index is 20.36 kg/m as calculated from the following:   Height as of this encounter: _0  (1.626 m).   Weight as of this encounter: 53.8 kg.   DVT prophylaxis: Lovenox Code Status: DNR Family Communication: Care discussed with patient Disposition  Plan:  Status is: Inpatient  Remains inpatient appropriate because: Patient admitted with pneumothorax colonoscopy, continue with pain management        Consultants:  CCM Palliative care  Procedures:  Bronchoscopy for Bx 11/03  Antimicrobials:    Subjective: Patient is complaining of back pain, she is asking if she still have lidocaine patch available.  Explained to her that she does have lidocaine patch available. She has not had a bowel movement, she is planning to take MiraLAX today. She coughed a few times while I was in the room.   Objective: Vitals:   04/23/21 1843 04/23/21 2055 04/24/21 0500 04/24/21 0639  BP:  123/67  (!) 143/72  Pulse:  94  83  Resp:      Temp:  98.8 F (37.1 C)  98.5 F (36.9 C)  TempSrc:  Oral  Oral  SpO2: 99% 99%  100%  Weight:   53.8 kg   Height:        Intake/Output Summary (Last 24 hours) at 04/24/2021 0742 Last data filed at 04/23/2021 1900 Gross per 24 hour  Intake --  Output 0 ml  Net 0 ml   Filed Weights   04/22/21 0519 04/23/21 0357 04/24/21 0500  Weight: 54.2 kg 54.7 kg 53.8 kg    Examination:  General exam: Appears calm and comfortable  Respiratory system: BL air movement, chest tube in place.  Cardiovascular system: S1 & S2 heard, RRR. Gastrointestinal system: Abdomen is nondistended, soft and nontender. No organomegaly or masses felt. Normal bowel sounds heard. Central nervous system: Alert and oriented. Extremities: Symmetric 5 x 5 power.  Data Reviewed: I have personally reviewed following labs and imaging studies  CBC: Recent Labs  Lab 04/20/21 1118 04/21/21 0717  WBC 1.6*  --  NEUTROABS 0.9*  --   HGB 9.4* 9.5*  HCT 28.9* 28.0*  MCV 82.1  --   PLT 92*  --    Basic Metabolic Panel: Recent Labs  Lab 04/20/21 1118 04/21/21 0717 04/22/21 0906 04/23/21 0140  NA 140 136 133* 136  K 2.9* 2.8* 3.1* 3.4*  CL 100 97* 98 102  CO2 27  --  26 25  GLUCOSE 103* 105* 131* 95  BUN _0 CREATININE 0.94 1.20* 1.27* 0.90  CALCIUM 9.7  --  9.4 8.9   GFR: Estimated Creatinine Clearance: 49.4 mL/min (by C-G formula based on SCr of 0.9 mg/dL). Liver Function Tests: Recent Labs  Lab 04/20/21 1118  AST 37  ALT 52*  ALKPHOS 76  BILITOT <0.2*  PROT 7.2  ALBUMIN 2.7*   No results for input(s): LIPASE, AMYLASE in the last 168 hours. No results for input(s): AMMONIA in the last 168 hours. Coagulation Profile: No results for input(s): INR, PROTIME in the last 168 hours. Cardiac Enzymes: No results for input(s): CKTOTAL, CKMB, CKMBINDEX, TROPONINI in the last 168 hours. BNP (last 3 results) No results for input(s): PROBNP in the last 8760 hours. HbA1C: No results for input(s): HGBA1C in the last 72 hours. CBG: Recent Labs  Lab 04/21/21 0631  GLUCAP 94   Lipid Profile: No results for input(s): CHOL, HDL, LDLCALC, TRIG, CHOLHDL, LDLDIRECT in the last 72 hours. Thyroid Function Tests: No results for input(s): TSH, T4TOTAL, FREET4, T3FREE, THYROIDAB in the last 72 hours. Anemia Panel: No results for input(s): VITAMINB12, FOLATE, FERRITIN, TIBC, IRON, RETICCTPCT in the last 72 hours. Sepsis Labs: No results for input(s): PROCALCITON, LATICACIDVEN in the last 168 hours.  Recent Results (from the past 240 hour(s))  SARS Coronavirus 2 (TAT 6-24 hrs)     Status: None   Collection Time: 04/18/21 12:00 AM  Result Value Ref Range Status   SARS Coronavirus 2 RESULT: NEGATIVE  Final    Comment: RESULT: NEGATIVESARS-CoV-2 INTERPRETATION:A NEGATIVE  test result means that SARS-CoV-2 RNA was not present in the specimen above the limit of detection of this test. This does not preclude a possible SARS-CoV-2 infection and should not be used as the  sole basis for patient management decisions. Negative results must be combined with clinical observations, patient history, and epidemiological information. Optimum specimen types and timing for peak viral levels during infections caused by  SARS-CoV-2  have not been determined. Collection of multiple specimens or types of specimens may be necessary to detect virus. Improper specimen collection and handling, sequence variability under primers/probes, or organism present below the limit of detection may  lead to false negative results. Positive and negative predictive values of testing are highly dependent on prevalence. False negative test results are more likely when prevalence of disease is high.The expected result is NEGATIVE.Fact S heet for  Healthcare Providers: LocalChronicle.no Sheet for Patients: SalonLookup.es Reference Range - Negative   Aerobic/Anaerobic Culture w Gram Stain (surgical/deep wound)     Status: None (Preliminary result)   Collection Time: 04/21/21 10:11 AM   Specimen: Lung, Left Upper Lobe; Tissue  Result Value Ref Range Status   Specimen Description TISSUE  Final   Special Requests LUL TRANSBRONCHIAL BIOPSIES SPEC D  Final   Gram Stain   Final    RARE WBC PRESENT, PREDOMINANTLY MONONUCLEAR NO ORGANISMS SEEN    Culture   Final    NO GROWTH 2 DAYS NO ANAEROBES ISOLATED; CULTURE IN PROGRESS FOR 5 DAYS Performed at [facility], [place] [address], [place], [place] [address]    Report Status PENDING  Incomplete  Acid Fast Smear (AFB)     Status: None   Collection Time: 04/21/21 10:11 AM   Specimen: Lung, Left Upper Lobe; Tissue  Result Value Ref Range Status   AFB Specimen Processing Concentration  Final   Acid Fast Smear Negative  Final    Comment: (NOTE) Performed At: [facility] [place], [place] [id number] Rush Farmer MD OF:[id number]    Source (AFB) TISSUE  Final    Comment: LUL TRANSBRONCHIAL BIOPSIES SPEC D Performed at [facility], [place] [address], [place], [place] [address]   Culture, BAL-quantitative w Gram Stain     Status: Abnormal   Collection Time: 04/21/21 10:16 AM   Specimen: Bronchial  Alveolar Lavage; Respiratory  Result Value Ref Range Status   Specimen Description BRONCHIAL ALVEOLAR LAVAGE  Final   Special Requests LUL BAL SPEC F  Final   Gram Stain   Final    MODERATE WBC PRESENT,BOTH PMN AND MONONUCLEAR NO ORGANISMS SEEN    Culture (A)  Final    6,000 COLONIES/mL Normal respiratory flora-no Staph aureus or Pseudomonas seen Performed at [facility], [address], [place], [place] [address]    Report Status 04/23/2021 FINAL  Final  Aerobic/Anaerobic Culture w Gram Stain (surgical/deep wound)     Status: None (Preliminary result)   Collection Time: 04/21/21 10:16 AM   Specimen: Bronchial Alveolar Lavage; Respiratory  Result Value Ref Range Status   Specimen Description BRONCHIAL ALVEOLAR LAVAGE  Final   Special Requests LUL BAL SPEC F  Final   Gram Stain   Final    FEW WBC PRESENT,BOTH PMN AND MONONUCLEAR NO ORGANISMS SEEN Performed at [facility], [address] [place], [place] [address]    Culture   Final    CULTURE REINCUBATED FOR BETTER GROWTH NO ANAEROBES ISOLATED; CULTURE IN PROGRESS FOR 5 DAYS    Report Status PENDING  Incomplete  Acid Fast Smear (AFB)     Status: None   Collection Time: 04/21/21 10:16 AM   Specimen: Bronchial Alveolar Lavage; Respiratory  Result Value Ref Range Status   AFB Specimen Processing Concentration  Final   Acid Fast Smear Negative  Final    Comment: (NOTE) Performed At: [facility] [place], [place] [id number] Rush Farmer MD RP:[id number]    Source (AFB) BRONCHIAL ALVEOLAR LAVAGE  Final    Comment: LUL BAL SPEC F Performed at [facility], [place] [address], [place], [place] [address]          Radiology Studies: DG CHEST PORT 1 VIEW  Result Date: 04/23/2021 CLINICAL DATA:  Pneumothorax on the left EXAM: PORTABLE CHEST 1 VIEW COMPARISON:  04/22/2021 FINDINGS: Pigtail catheter on the left unchanged. Small left apical pneumothorax appears slightly larger. Left lung  clear Right upper lobe and right lower lobe density is unchanged. There is associated pleural thickening or fluid on the right. Port-A-Cath tip in the SVC. IMPRESSION: Slight increase in small left apical pneumothorax Right upper lobe and right lower lobe lung densities unchanged. Electronically Signed   By: Franchot Gallo M.D.   On: 04/23/2021 11:03        Scheduled Meds:  amLODipine  5 mg Oral Daily   aspirin  81 mg Oral Daily   B-complex with vitamin C  1 tablet Oral Daily   carvedilol  12.5 mg Oral BID  WC   chlorthalidone  25 mg Oral Daily   dorzolamide-timolol  1 drop Both Eyes BID   enoxaparin (LOVENOX) injection  60 mg Subcutaneous Q12H   ezetimibe  10 mg Oral Daily   FLUoxetine  10 mg Oral Daily   folic acid  1 mg Oral Daily   guaiFENesin  1,200 mg Oral BID   isosorbide mononitrate  30 mg Oral Daily   latanoprost  1 drop Both Eyes QHS   lidocaine  1 patch Transdermal Q24H   osimertinib mesylate  80 mg Oral Daily   oxyCODONE  10 mg Oral Q12H   pantoprazole  40 mg Oral Daily   polyethylene glycol  17 g Oral Daily   potassium chloride  40 mEq Oral Once   sodium chloride flush  10 mL Other Q8H   sucralfate  1 g Oral TID WC & HS   Continuous Infusions:   LOS: 3 days    Time spent: 35 Minutes.     Elmarie Shiley, MD [facility]   If 7PM-7AM, please contact night-coverage [email or web address]  04/24/2021, 7:42 AM

## 2021-04-24 NOTE — Evaluation (Signed)
Physical Therapy Evaluation Patient Details Name: Allison [name] MRN: [id number] DOB: 1950-09-01 Today's Date: 04/24/2021  History of Present Illness  Pt is a 70 y.o. F who presents 04/21/2021 for post bronchoscopy PTX. 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  Clinical Impression  Pt admitted with above; she is pleasant and motivated to participate in therapy session. Pt ambulating 100 feet with a cane at a min guard assist level. HR peak 117 bpm. Education provided regarding incentive spirometer/flutter valve use and pillow splinting. Pt presents with generalized weakness and decreased endurance compared to per baseline. Will benefit from acute PT to address deficits and progress mobility as tolerated. No PT follow up anticipated.      Recommendations for follow up therapy are one component of a multi-disciplinary discharge planning process, led by the attending physician.  Recommendations may be updated based on patient status, additional functional criteria and insurance authorization.  Follow Up Recommendations No PT follow up    Assistance Recommended at Discharge PRN  Functional Status Assessment Patient has had a recent decline in their functional status and demonstrates the ability to make significant improvements in function in a reasonable and predictable amount of time.   Engineer, technical sales    Recommendations for Other Services       Precautions / Restrictions Precautions Precautions: Fall Restrictions Weight Bearing Restrictions: No      Mobility  Bed Mobility Overal bed mobility: Needs Assistance Bed Mobility: Supine to Sit     Supine to sit: Supervision     General bed mobility comments: min cues for technique    Transfers Overall transfer level: Needs assistance Equipment used: Straight cane Transfers: Sit to/from Stand Sit to Stand: Min  guard                Ambulation/Gait Ambulation/Gait assistance: Min guard Gait Distance (Feet): 100 Feet Assistive device: Straight cane Gait Pattern/deviations: Step-through pattern;Decreased stride length Gait velocity: decreased Gait velocity interpretation: <1.8 ft/sec, indicate of risk for recurrent falls General Gait Details: slow, mildly unsteady pace, self cueing for "looking up," PT cued for larger step lengths  Stairs            Wheelchair Mobility    Modified Rankin (Stroke Patients Only)       Balance Overall balance assessment: Mild deficits observed, not formally tested                                           Pertinent Vitals/Pain Pain Assessment: Faces Faces Pain Scale: Hurts even more Pain Location: chest tube site, particularly with coughing Pain Descriptors / Indicators: Discomfort;Guarding;Grimacing Pain Intervention(s): Monitored during session    Home Living Family/patient expects to be discharged to:: Private residence Living Arrangements: Alone Available Help at Discharge: Family;Available PRN/intermittently Type of Home: House Home Access: Stairs to enter Entrance Stairs-Rails: Psychiatric nurse of Steps: 4 front   Home Layout: One level Home Equipment: Shower seat - built in      Prior Function Prior Level of Function : Independent/Modified Independent             Mobility Comments: using cane recently       Hand Dominance   Dominant Hand: Right    Extremity/Trunk Assessment   Upper Extremity Assessment Upper Extremity Assessment: Defer to OT  evaluation    Lower Extremity Assessment Lower Extremity Assessment: Generalized weakness       Communication   Communication: No difficulties  Cognition Arousal/Alertness: Awake/alert Behavior During Therapy: WFL for tasks assessed/performed Overall Cognitive Status: Within Functional Limits for tasks assessed                                           General Comments      Exercises Other Exercises Other Exercises: x5 reps each of IS and flutter valve   Assessment/Plan    PT Assessment Patient needs continued PT services  PT Problem List Decreased strength;Decreased activity tolerance;Decreased balance;Decreased mobility;Pain       PT Treatment Interventions DME instruction;Gait training;Stair training;Functional mobility training;Therapeutic activities;Therapeutic exercise;Balance training;Patient/family education    PT Goals (Current goals can be found in the Care Plan section)  Acute Rehab PT Goals Patient Stated Goal: get stronger PT Goal Formulation: With patient Time For Goal Achievement: 05/08/21 Potential to Achieve Goals: Good    Frequency Min 3X/week   Barriers to discharge        Co-evaluation               AM-PAC PT "6 Clicks" Mobility  Outcome Measure Help needed turning from your back to your side while in a flat bed without using bedrails?: None Help needed moving from lying on your back to sitting on the side of a flat bed without using bedrails?: A Little Help needed moving to and from a bed to a chair (including a wheelchair)?: A Little Help needed standing up from a chair using your arms (e.g., wheelchair or bedside chair)?: A Little Help needed to walk in hospital room?: A Little Help needed climbing 3-5 steps with a railing? : A Little 6 Click Score: 19    End of Session   Activity Tolerance: Patient tolerated treatment well Patient left: in chair;with call bell/phone within reach;with family/visitor present Nurse Communication: Mobility status PT Visit Diagnosis: Unsteadiness on feet (R26.81);Muscle weakness (generalized) (M62.81)    Time: 1022-1050 PT Time Calculation (min) (ACUTE ONLY): 28 min   Charges:   PT Evaluation $PT Eval Moderate Complexity: 1 Mod PT Treatments $Gait Training: 8-22 mins        Wyona Almas, PT, DPT Acute  Rehabilitation Services Pager [phone] Office [phone]   Deno Etienne 04/24/2021, 12:50 PM

## 2021-04-24 NOTE — Progress Notes (Signed)
[facility] Progress Note Patient Name: Allison [name] DOB: November 21, 1950 MRN: [id number]   Date of Service  04/24/2021  HPI/Events of Note  Portable CXR at 6 PM reveals: 1. Stable right pulmonary infiltrates. 2. Stable left chest tube. A tiny left pneumothorax remains, smaller in the interval.  eICU Interventions  Plan: Continue present management. Follow up CXR at 5 AM.     Intervention Category Major Interventions: Other:  Lysle Dingwall 04/24/2021, 7:24 PM

## 2021-04-24 NOTE — Progress Notes (Addendum)
Palliative Medicine Inpatient Follow Up Note  Consulting Provider: [name], MD   Reason for consult:   Palliative Care Consult Services Palliative Medicine Consult    Symptom Management Consult  Reason for Consult? advanced lung cancer, sees Dr. [name] for palliative outpatient service. in with complication of bronchoscopy, has a lot of anxiety, conern with general direction of her condition.    HPI:  Per intake H&P --> Allison [name] is a 70 year old female, past medical history of stage IV adenocarcinoma of the lung.  Was initially diagnosed with a pleural effusion in July 2019.  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  Initially had a Pleurx catheter placed.  Had palliative radiotherapy and radiation as well as placement on Tagrisso.  Overall doing well for treatment.  Unfortunately CT imaging revealed progression of disease.  Has started chemotherapy with Dr. [name].  CT scan of the chest on 03/04/2021 was completed.  She had evidence of septal thickening nodularity extensive pleural thickening loculated right effusion concern for lymphangitic spread. Oncology requested a repeat biopsy w/ broncoscopy for further workup of possible mutations.    Palliative care has been asked to get involved to further address symptom burden in the setting of advanced disease. Allison [name] had been seen in August of this year inpatient by [name].    Patient is follow outpatient by [name] Dr. [name].  Today's Discussion (04/24/2021):  *Please note that this is a verbal dictation therefore any spelling or grammatical errors are due to the "[facility]" system interpretation.  Chart reviewed.   I met with Allison [name] at bedside this morning. Her grandson and significant other were present as well as her daughter Allison [name] over the speaker-phone.   I reviewed with Allison [name] the conversation I had shared with [facility] yesterday and the symptoms that  we are trying to improve upon.   We reviewed that Allison [name] has had a troubling morning and is feeling exhausted overall. She shares that she was up around 5:30 due to having increase in pain. From the overall perspective of pain management she did endorse relief with the initiation of OxyContin.  Allison [name] states that in addition to the opioids the heat pack application to her back was helpful.We reviewed trying an NSAID which when Allison [name] was initiated on Lovenox she was told not to use. I shared that I would reach out to the medical team and clinical pharmacist to better identify if this would be safe from their perspective.  From the perspective of anxiety we discussed increasing xanax frequency to TID prn. We reviewed that the Prozac will take a few weeks but I suspect we will start seeing the positive effects within the next two weeks.  From the perspective of failure to thrive we reviewed that the Marinol is not helpful. Shared that I would discuss with the primary team and pharmacist initiation of low dose PRN decadron. We also reviewed the plan for nutrition to evaluate [facility].  From a physical mobility perspective, Allison [name] had been receiving therapy though recently had not felt the will to continue this therefore it was cancelled. She presently is in a place where she would like to optimize her strength and mobility again. We reviewed the plan for the therapist to work with her today.   Allison [name] shares that as of presently she would like to get some rest and regroup later on in the day. She is feeling discouraged by the morning. Positive  encouragement was provided.   Questions and concerns addressed   Palliative support provided  Objective Assessment: Vital Signs Vitals:   04/24/21 0639 04/24/21 0825  BP: (!) 143/72 114/63  Pulse: 83 85  Resp:  18  Temp: 98.5 F (36.9 C) 99.3 F (37.4 C)  SpO2: 100% 98%    Intake/Output Summary (Last 24 hours) at 04/24/2021 1025 Last data filed at 04/23/2021  1900 Gross per 24 hour  Intake --  Output 0 ml  Net 0 ml   Last Weight  Most recent update: 04/24/2021  6:48 AM    Weight  53.8 kg (118 lb 9.7 oz)            Gen:  Frail elderly AA F in NAD HEENT: moist mucous membranes CV: Regular rate and rhythm  PULM: On RA, (+) L sided chest tube ABD: soft/nontender  EXT: No edema  Neuro: Alert and oriented x3   SUMMARY OF RECOMMENDATIONS   DNAR/DNI   Referred to outpatient Palliative Oncology symptom management clinic --> inbox message sent to Allison [name] and Allison [name]   Referred to Dr. Elias Else --> Email send to he and Dr. Julien Nordmann for a formal referral   Symptom management with daily modifications as below   Code Status/Advance Care Planning: DNAR/DNI   Symptom Management:  Generalized Anxiety: - Was on Prozac years ago which worked well for here, we will start at lowest dose and titrate up as able - Increase Xanax 0.40m PO TID PRN (patient has a profound fear of needle sticks has been advised to take this prior)   Malignancy related pain: - Oxycodone 5-173mPO Q4H PRN - Will start Oxycontin 1033mO BID - Ibuprofen 600m66m x1 (has been discussed with hospitalist, pulmonologist, and clinical pharmacist) -  Stop Fentanyl  - (not being used due to poorly functioning IV) - Heat pads PRN   Insomnia: - Present regiment of xanax and tylenol is working - Discussed moving forward the likelihood of modify  this further   Phlegm: - Continue Duonebs Q6H PRN   Bowel Management: - Miralax 17gm Qday - LBM 11/3   Failure to Thrive: - Dietician consult - Stop Marinol   - Will start decadron 1mg 58mPRN BID for appetite stimulation (has been discussed with hospitalist, pulmonologist, and clinical pharmacist)   Activity Intolerance: - PT/OT evaluations today   Emotional Support: - Therapeutic Listening - A member of multiple support groups through Zoom Lelandeferral to Dr. Matt Elias Elseiritual Support: -  PaulaNatsumiMethoKensingtone gets support through a member of her cancer support group who is a chaplClinical biochemiste Spent: 60 Greater than 50% of the time was spent in counseling and coordination of care ______________________________________________________________________________________ MicheEunice Team Cell Phone: [phone] utilize secure chat with additional questions, if there is no response within 30 minutes please call the above phone number  Palliative Medicine Team providers are available by phone from 7am to 7pm daily and can be reached through the team cell phone.  Should this patient require assistance outside of these hours, please call the patient's attending physician.

## 2021-04-24 NOTE — Progress Notes (Signed)
Ct valve closed off to pt. This rn opened valve to flush ct . Pt immediately developed severe pain after valve opened. Manually closured valve immediately.Ct not flushed d/t immediate severe pain with opening of valve, no bubbling or air leak visible. This rn paged dr Tyrell Antonio and RT. RT to bedside. This rn ordered stat cxr and paged dr.smith. Dr Tamala Julian returned page and spoke to pt/duaghter/son/RT via speaker phone. Md reassured pt/family of probability of nerve involvement. Maintained closure of valve and turned ct off to suction per md instructions.  Md recommending and encouraging IV pain med, pt/daughter adamant about not taking IV pain med. Will give oxycodone IR 10 mg for now. Brooke critical care NP to bedside at 1815 to assess pt/ct offer pt/family reassurance

## 2021-04-24 NOTE — Progress Notes (Signed)
   NAME:  SHERISSA TENENBAUM, MRN:  [id number], DOB:  09/02/1950, LOS: 3 ADMISSION DATE:  04/21/2021, CONSULTATION DATE:  11/3 REFERRING MD:  Valeta Harms, CHIEF COMPLAINT:  Post bronchoscopy pneumothorax   History of Present Illness:  70 y/o female with a complicated past medical history admitted after a post bronchoscopy pneumothorax.  Pertinent  Medical History  Stage IV adenocarcinoma of lung with malignant effusion, history of pleur-x; Rx chemo-rad right lung Anxiety DVT/PE 2022 Stroke 2022, felt to be eliquis failure GERD Hypertension Depression  Significant Hospital Events: Including procedures, antibiotic start and stop dates in addition to other pertinent events   11/3 robotic biopsy right lung nodule, left upper lobe infiltrate worrisome for lymphangitic spread; complicated by left pneumothorax, chest tube placed Complains of a lot of pain in the left chest, has air leak  Interim History / Subjective:  Better pain control today. Family at bedside and on phone. Still productive sputum with small blood.  Objective   Blood pressure 114/63, pulse 85, temperature 99.3 F (37.4 C), temperature source Oral, resp. rate 18, height 5\' 4"  (1.626 m), weight 53.8 kg, SpO2 98 %.        Intake/Output Summary (Last 24 hours) at 04/24/2021 0841 Last data filed at 04/23/2021 1900 Gross per 24 hour  Intake --  Output 0 ml  Net 0 ml    Filed Weights   04/22/21 0519 04/23/21 0357 04/24/21 0500  Weight: 54.2 kg 54.7 kg 53.8 kg    Examination: Frail lady in NAD Lungs diminished No tidaling or air leak in atrium Ext warm Moves all 4 ext to command  Labs pending CXR with enlarged PTX on [facility] Problem list     Assessment & Plan:  Stage IV lung cancer with mets to pleura Postprocedural L PTX Abnormal CT L lung, biopsy pending Anxiety and pain- better today Hx VTE  - Chest tube back to suction - Continue pain control as ordered - Lovenox for now, eliquis on  DC - Appreciate palliative input - Will follow with you, family updated at length - Of note family/patient would like observation overnight whenever chest tube eventually gets pulled to make sure no recurrence  Best Practice (right click and "Reselect all SmartList Selections" daily)   Diet/type: Regular consistency (see orders) DVT prophylaxis: systemic dose LMWH GI prophylaxis: N/A Lines: N/A Foley:  N/A Code Status:  full code  Erskine Emery MD PCCM

## 2021-04-24 NOTE — TOC Initial Note (Signed)
Transition of Care [facility]) - Initial/Assessment Note    Patient Details  Name: Allison [name] MRN: [id number] Date of Birth: [birth year]  [facility] CM/SW Contact:    Bartholomew Crews, RN Phone Number: [phone] 04/24/2021, 12:41 PM  Clinical Narrative:                  Spoke with patient and grandson at the bedside with her daughter, [name], on speaker phone. Discussed cost savings for pradaxa - advised to go on line to search copay savings to get information. Will request benefits check for Monday.   Patient stated that she will be in hospital another couple of days.   TOC following for transition needs.   Expected Discharge Plan: Home/Self Care Barriers to Discharge: Continued Medical Work up   Patient Goals and CMS Choice   CMS Medicare.gov Compare Post Acute Care list provided to:: Patient Choice offered to / list presented to : NA  Expected Discharge Plan and Services Expected Discharge Plan: Home/Self Care In-house Referral: NA Discharge Planning Services: CM Consult   Living arrangements for the past 2 months: Single Family Home Expected Discharge Date: 04/21/21               DME Arranged: N/A DME Agency: NA       HH Arranged: NA HH Agency: NA        Prior Living Arrangements/Services Living arrangements for the past 2 months: Single Family Home Lives with:: Self Patient language and need for interpreter reviewed:: Yes Do you feel safe going back to the place where you live?: Yes      Need for Family Participation in Patient Care: Yes (Comment) Care giver support system in place?: Yes (comment)   Criminal Activity/Legal Involvement Pertinent to Current Situation/Hospitalization: No - Comment as needed  Activities of Daily Living      Permission Sought/Granted Permission sought to share information with : Family Supports Permission granted to share information with : Yes, Verbal Permission Granted  Share Information with NAME: Marvin Maenza     Permission granted to share info w Relationship: daughter  Permission granted to share info w Contact Information: [phone]  Emotional Assessment Appearance:: Appears stated age Attitude/Demeanor/Rapport: Engaged Affect (typically observed): Accepting Orientation: : Oriented to Self, Oriented to Place, Oriented to  Time, Oriented to Situation Alcohol / Substance Use: Not Applicable Psych Involvement: No (comment)  Admission diagnosis:  Pneumothorax [J93.9] Patient Active Problem List   Diagnosis Date Noted   Pneumothorax 04/21/2021   Lung nodule 04/01/2021   Epiretinal membrane, right eye 02/28/2021   Nuclear sclerotic cataract of right eye 02/28/2021   Nuclear sclerotic cataract of left eye 02/28/2021   Coronary artery disease of native artery of native heart with stable angina pectoris (HCC)    CVA (cerebral vascular accident) ([place]) 02/12/2021   Current use of long term anticoagulation 02/12/2021   Hypokalemia 02/12/2021   Leg DVT (deep venous thromboembolism), acute, bilateral ([place]) 02/08/2021   Aortic atherosclerosis ([place]) 02/08/2021   Respiratory failure ([place]) 02/08/2021   Postobstructive pneumonia 02/07/2021   Pulmonary embolism ([place]) 02/07/2021   Elevated troponin 02/07/2021   Encounter to establish care 02/02/2021   Shortness of breath 02/02/2021   Cough 02/02/2021   Statin myopathy 12/03/2020   Acute non-recurrent frontal sinusitis 11/18/2020   NSCLC with EGFR mutation ([place]) 06/30/2020   Hx of chest tube placement 02/11/2020   History of chest tube placement 11/12/2019   Abnormal liver function 08/20/2019   Gastroesophageal  reflux disease without esophagitis 08/20/2019   Generalized anxiety disorder 08/20/2019   Glaucoma 08/20/2019   Hyperlipidemia 08/20/2019   Menopausal syndrome 08/20/2019   Raynaud's phenomenon 08/20/2019   Vitamin D deficiency 08/20/2019   Status post coronary artery stent placement    History of non-ST elevation myocardial  infarction (NSTEMI) 06/12/2019   Hypertension 03/24/2019   Encounter for antineoplastic chemotherapy 01/22/2018   Adenocarcinoma of right lung, stage 4 ([place]) 01/04/2018   Goals of care, counseling/discussion 01/04/2018   Thyroid nodule 12/11/2017   Pleural effusion, right 12/11/2017   Insomnia 04/20/2014   PCP:  [name], MD Pharmacy:   [id number] [place], [place]Phone: [phone]: [address] [place]: [phone]: [address]Phone: [phone]: [phone]    Social Determinants of Health (SDOH) Interventions    Readmission Risk Interventions No flowsheet data found.

## 2021-04-24 NOTE — Progress Notes (Signed)
PCCM Interval note   Called to bedside to check left pigtail catheter.  CT was clamped, RN opened 3 way stop cock to flush, when patient developed severe, sharp stabbing pain per pt on left chest wall.  CT was closed back and MD notified. Since given oxy IR 10mg .  Patient reports pain is starting to ease up.     Repeat CXR shows stable position of left pigtail CT, smaller left ptx compared to AM CXR, stable right infiltrates.   Very pleasant patient, alert/ oriented, anxious appearing.  Family remains at bedside.  Clear breath sounds on left.  No respiratory distress.  Hemodynamically stable, not hypoxic.   Suspect catheter may be irritating a nerve vs muscle spasm.    P:  Leave left pigtail clamped Can consider muscle relaxant if ongoing chest wall pain. Repeat CXR in am or sooner if symptomatic- SOB, cough, worsening pain, hypoxia Will reassess in am, if stable plans to d/c.  Would recommend pre-medicating prior to CT removal.        Kennieth Rad, ACNP [facility] 04/24/2021, 7:27 PM  See Amion for pager If no response to pager, please call PCCM consult pager After 7:00 pm call Elink

## 2021-04-24 NOTE — Evaluation (Signed)
Occupational Therapy Evaluation Patient Details Name: Allison [name] MRN: [id number] DOB: 1951-04-18 Today's Date: 04/24/2021   History of Present Illness Pt is a 70 y.o. F who presents 04/21/2021 for post bronchoscopy PTX. 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   Clinical Impression   Pt presents with decreased balance, strength, and activity tolerance. Pt currently requiring supervision - Min guard with ADLs and functional transfers/mobility. Pt ambulated to bathroom and completed toilet transfer during session with max HR 107 bpm. Pt reports that she is primarily modified independent at baseline, however sometimes requires minimal assistance on days that she feels weaker. Pt lives alone, however she has family that rotates and stays with her at all times. Pt should be safe to return home without any further skilled OT services once medically cleared. Will follow acutely to maximize safety/independence with ADLs prior to return home.     Recommendations for follow up therapy are one component of a multi-disciplinary discharge planning process, led by the attending physician.  Recommendations may be updated based on patient status, additional functional criteria and insurance authorization.   Follow Up Recommendations  No OT follow up    Assistance Recommended at Discharge PRN  Functional Status Assessment  Patient has had a recent decline in their functional status and demonstrates the ability to make significant improvements in function in a reasonable and predictable amount of time.  Equipment Recommendations  None recommended by OT    Recommendations for Other Services       Precautions / Restrictions Precautions Precautions: Fall Restrictions Weight Bearing Restrictions: No      Mobility Bed Mobility Overal bed mobility: Needs Assistance Bed Mobility: Supine to Sit      Supine to sit: Supervision     General bed mobility comments: min cues for technique    Transfers Overall transfer level: Needs assistance Equipment used: Straight cane Transfers: Sit to/from Stand Sit to Stand: Min guard                  Balance Overall balance assessment: Mild deficits observed, not formally tested                                         ADL either performed or assessed with clinical judgement   ADL Overall ADL's : Needs assistance/impaired Eating/Feeding: Independent   Grooming: Supervision/safety;Standing   Upper Body Bathing: Supervision/ safety;Sitting   Lower Body Bathing: Min guard;Sit to/from stand   Upper Body Dressing : Set up;Sitting   Lower Body Dressing: Min guard;Sit to/from stand   Toilet Transfer: Min guard;Ambulation;Grab bars;Regular Museum/gallery exhibitions officer and Hygiene: Independent;Sitting/lateral lean       Functional mobility during ADLs: Min guard;Cane General ADL Comments: Limited by balance, weakness, and activity tolerance. Pt reports she is near her baseline for occupational performance.     Vision Baseline Vision/History: 1 Wears glasses Ability to See in Adequate Light: 1 Impaired       Perception     Praxis      Pertinent Vitals/Pain Pain Assessment: Faces Faces Pain Scale: Hurts little more Pain Location: chest tube site, particularly with coughing Pain Descriptors / Indicators: Discomfort;Guarding;Grimacing Pain Intervention(s): Monitored during session;Repositioned     Hand Dominance Right   Extremity/Trunk Assessment Upper Extremity Assessment Upper Extremity Assessment: Generalized weakness  Lower Extremity Assessment Lower Extremity Assessment: Defer to PT evaluation       Communication Communication Communication: No difficulties   Cognition Arousal/Alertness: Awake/alert Behavior During Therapy: WFL for tasks assessed/performed Overall Cognitive  Status: Within Functional Limits for tasks assessed                                       General Comments       Exercises    Shoulder Instructions      Home Living Family/patient expects to be discharged to:: Private residence Living Arrangements: Alone Available Help at Discharge: Family;Available PRN/intermittently Type of Home: House Home Access: Stairs to enter [employer] of Steps: 4 front Entrance Stairs-Rails: Right;Left Home Layout: One level     Bathroom Shower/Tub: Occupational psychologist: Handicapped height     Home Equipment: Cane - single point;Shower seat - built in          Prior Functioning/Environment Prior Level of Function : Independent/Modified Independent             Mobility Comments: using cane recently          OT Problem List: Decreased strength;Decreased activity tolerance;Impaired balance (sitting and/or standing);Pain      OT Treatment/Interventions: Self-care/ADL training;Therapeutic exercise;Neuromuscular education;Energy conservation;Therapeutic activities;Patient/family education;Balance training    OT Goals(Current goals can be found in the care plan section) Acute Rehab OT Goals Patient Stated Goal: return home OT Goal Formulation: With patient Time For Goal Achievement: 05/08/21 Potential to Achieve Goals: Good  OT Frequency: Min 2X/week   Barriers to D/C:            Co-evaluation              AM-PAC OT "6 Clicks" Daily Activity     Outcome Measure Help from another person eating meals?: None Help from another person taking care of personal grooming?: A Little Help from another person toileting, which includes using toliet, bedpan, or urinal?: A Little Help from another person bathing (including washing, rinsing, drying)?: A Little Help from another person to put on and taking off regular upper body clothing?: A Little Help from another person to put on and taking off  regular lower body clothing?: A Little 6 Click Score: 19   End of Session Equipment Utilized During Treatment: Other (comment) (cane) Nurse Communication: Mobility status  Activity Tolerance: Patient tolerated treatment well Patient left: in bed;with call bell/phone within reach;with family/visitor present  OT Visit Diagnosis: Unsteadiness on feet (R26.81);Muscle weakness (generalized) (M62.81)                Time: 0932-6712 OT Time Calculation (min): 24 min Charges:  OT General Charges $OT Visit: 1 Visit OT Evaluation $OT Eval Moderate Complexity: 1 Mod OT Treatments $Self Care/Home Management : 8-22 mins  Courtni Balash C, OT/L  Acute Rehab [place] 04/24/2021, 1:49 PM

## 2021-04-25 ENCOUNTER — Inpatient Hospital Stay (HOSPITAL_COMMUNITY): Payer: Medicare HMO

## 2021-04-25 DIAGNOSIS — E43 Unspecified severe protein-calorie malnutrition: Secondary | ICD-10-CM | POA: Insufficient documentation

## 2021-04-25 DIAGNOSIS — J95811 Postprocedural pneumothorax: Secondary | ICD-10-CM | POA: Diagnosis not present

## 2021-04-25 LAB — CBC
HCT: 24.9 % — ABNORMAL LOW (ref 36.0–46.0)
Hemoglobin: 8 g/dL — ABNORMAL LOW (ref 12.0–15.0)
MCH: 26.3 pg (ref 26.0–34.0)
MCHC: 32.1 g/dL (ref 30.0–36.0)
MCV: 81.9 fL (ref 80.0–100.0)
Platelets: 232 10*3/uL (ref 150–400)
RBC: 3.04 MIL/uL — ABNORMAL LOW (ref 3.87–5.11)
RDW: 16.5 % — ABNORMAL HIGH (ref 11.5–15.5)
WBC: 1.7 10*3/uL — ABNORMAL LOW (ref 4.0–10.5)
nRBC: 0 % (ref 0.0–0.2)

## 2021-04-25 LAB — BASIC METABOLIC PANEL
Anion gap: 8 (ref 5–15)
BUN: 8 mg/dL (ref 8–23)
CO2: 26 mmol/L (ref 22–32)
Calcium: 9 mg/dL (ref 8.9–10.3)
Chloride: 99 mmol/L (ref 98–111)
Creatinine, Ser: 1.06 mg/dL — ABNORMAL HIGH (ref 0.44–1.00)
GFR, Estimated: 57 mL/min — ABNORMAL LOW (ref >60)
Glucose, Bld: 104 mg/dL — ABNORMAL HIGH (ref 70–99)
Potassium: 3.8 mmol/L (ref 3.5–5.1)
Sodium: 133 mmol/L — ABNORMAL LOW (ref 135–145)

## 2021-04-25 MED ORDER — ENOXAPARIN SODIUM 60 MG/0.6ML IJ SOSY
60.0000 mg | PREFILLED_SYRINGE | Freq: Two times a day (BID) | INTRAMUSCULAR | Status: DC
  Administered 2021-04-26: 60 mg via SUBCUTANEOUS
  Filled 2021-04-25: qty 0.6

## 2021-04-25 MED ORDER — OXYCODONE HCL 5 MG PO TABS
5.0000 mg | ORAL_TABLET | Freq: Once | ORAL | Status: AC
  Administered 2021-04-25: 5 mg via ORAL
  Filled 2021-04-25: qty 1

## 2021-04-25 MED ORDER — ENSURE ENLIVE PO LIQD
237.0000 mL | Freq: Every day | ORAL | Status: DC
  Administered 2021-04-25: 237 mL via ORAL

## 2021-04-25 MED ORDER — FERROUS SULFATE 325 (65 FE) MG PO TABS
325.0000 mg | ORAL_TABLET | Freq: Every day | ORAL | Status: DC
  Administered 2021-04-26: 325 mg via ORAL
  Filled 2021-04-25: qty 1

## 2021-04-25 MED ORDER — PROSOURCE PLUS PO LIQD
30.0000 mL | Freq: Three times a day (TID) | ORAL | Status: DC
  Administered 2021-04-25 – 2021-04-26 (×2): 30 mL via ORAL
  Filled 2021-04-25 (×2): qty 30

## 2021-04-25 NOTE — TOC Benefit Eligibility Note (Signed)
Transition of Care [facility]) Benefit Eligibility Note    Patient Details  Name: SHYNIA DALEO MRN: [id number] Date of Birth: Feb 27, 1951   Medication/Dose: Pradaxa 150mg  and or 75mg . twice a day  Covered?: Yes  Tier:  (tier 4)  Prescription Coverage [facility]  Spoke with Person/Company/Phone Number:: Kelva,K. W/[employer] PH#[phone]  Co-Pay: 132.02  Prior Approval: No  Deductible:  (?)       [name] Phone Number: 04/25/2021, 9:44 AM

## 2021-04-25 NOTE — Progress Notes (Signed)
PROGRESS NOTE    Allison [name]  VOJ:[id number] DOB: 1951-01-03 DOA: 04/21/2021 PCP: [name], MD   Brief Narrative: 70 year old with past medical history significant for stage IV adenocarcinoma of the lung with malignant effusion, history of Pleur-X, chemo and radiation to the right lung, DVT PE 2022, a stroke 2022(felt to be Eliquis failure), hypertension, depression admitted after a  post bronchoscopy pneumothorax   Assessment & Plan:   Active Problems:   Adenocarcinoma of right lung, stage 4 (HCC)   Lung nodule   Pneumothorax  1-Left pneumothorax post bronchoscopy: -Patient had a repeated CT 9/16, evidence of septal thickening nodularity extensive pleural thickening, loculated right effusion concern for lymphangitic spread.  Oncology requested a repeat biopsy with bronchoscopy for further work-up of possible mutations. -Post bronchoscopy develop left pneumothorax. Chest pain: Pleuritic related to malignancy pneumothorax: CCM  following.  Chest x ray 11/06: Pneumothorax increased to 15 %, placed on suctioning.  -Continue with guaifenesin Chest x ray today improved. Chest tube removed.  Plan to observed post chest tube removal.   Hypertension: Continue with carvedilol, chlorthalidone.  DVT/PE and stroke history: Plan to hold Lovenox today, ok to resume tomorrow.  Patient interested in transition to oral anticoagulation. Copay for pradaxa 132 $  Anxiety, general dysphoria with progression of her condition and recurrent complication, Pain management.  Palliative care consulted Appreciate palliative care with symptoms management with Mrs Hutto.  Continue with the lidocaine patch, Xanax as needed. Decadron 1 tablet twice daily as needed for appetite stimulant  Continue with OxyContin and oxycodone as needed  Hypokalemia: Replaced.   Anemia; leukopenia; secondary to chemo.  Denies melena, hematochezia.  Discussed with Dr Julien Nordmann, he recommend Granix for  leukopenia. Daughter will discussed with Mrs. Donalson in regards granix.  If hb decreased further patient might need Blood transfusion. He will check labs on Thursday.      Estimated body mass index is 20.36 kg/m as calculated from the following:   Height as of this encounter: _0  (1.626 m).   Weight as of this encounter: 53.8 kg.   DVT prophylaxis: Lovenox Code Status: DNR Family Communication: Care discussed with patient, daughter at bedside.  Disposition Plan:  Status is: Inpatient  Remains inpatient appropriate because: Patient admitted with pneumothorax colonoscopy, continue with pain management        Consultants:  [facility] Palliative care  Procedures:  Bronchoscopy for Bx 11/03  Antimicrobials:    Subjective: She was able to have a Bowel movement. He drank ensure. She is lactose intolerant.  She report relieved from pain, now chest tube was removed.     Objective: Vitals:   04/24/21 2150 04/24/21 2154 04/25/21 0607 04/25/21 0749  BP: 105/62 115/70 129/70 118/73  Pulse: 89 97 96 80  Resp:    17  Temp: 98.5 F (36.9 C) 98.9 F (37.2 C) 98.6 F (37 C) 98.7 F (37.1 C)  TempSrc: Oral Oral Oral Oral  SpO2: 97% 98% 98% 99%  Weight:      Height:        Intake/Output Summary (Last 24 hours) at 04/25/2021 1356 Last data filed at 04/24/2021 1830 Gross per 24 hour  Intake --  Output 0 ml  Net 0 ml    Filed Weights   04/22/21 0519 04/23/21 0357 04/24/21 0500  Weight: 54.2 kg 54.7 kg 53.8 kg    Examination:  General exam: NAD Respiratory system: BL air movement, dressing in place left side  Cardiovascular system: S 1, S 2 RRR Gastrointestinal  system: BS present, soft, nt Central nervous system: Alert Extremities: No edema  Data Reviewed: I have personally reviewed following labs and imaging studies  CBC: Recent Labs  Lab 04/20/21 1118 04/21/21 0717 04/25/21 0051  WBC 1.6*  --  1.7*  NEUTROABS 0.9*  --   --   HGB 9.4* 9.5* 8.0*  HCT  28.9* 28.0* 24.9*  MCV 82.1  --  81.9  PLT 92*  --  644    Basic Metabolic Panel: Recent Labs  Lab 04/20/21 1118 04/21/21 0717 04/22/21 0906 04/23/21 0140 04/25/21 0051  NA 140 136 133* 136 133*  K 2.9* 2.8* 3.1* 3.4* 3.8  CL 100 97* 98 102 99  CO2 27  --  _0 GLUCOSE 103* 105* 131* 95 104*  BUN _1 CREATININE 0.94 1.20* 1.27* 0.90 1.06*  CALCIUM 9.7  --  9.4 8.9 9.0    GFR: Estimated Creatinine Clearance: 41.9 mL/min (A) (by C-G formula based on SCr of 1.06 mg/dL (H)). Liver Function Tests: Recent Labs  Lab 04/20/21 1118  AST 37  ALT 52*  ALKPHOS 76  BILITOT <0.2*  PROT 7.2  ALBUMIN 2.7*    No results for input(s): LIPASE, AMYLASE in the last 168 hours. No results for input(s): AMMONIA in the last 168 hours. Coagulation Profile: No results for input(s): INR, PROTIME in the last 168 hours. Cardiac Enzymes: No results for input(s): CKTOTAL, CKMB, CKMBINDEX, TROPONINI in the last 168 hours. BNP (last 3 results) No results for input(s): PROBNP in the last 8760 hours. HbA1C: No results for input(s): HGBA1C in the last 72 hours. CBG: Recent Labs  Lab 04/21/21 0631  GLUCAP 94    Lipid Profile: No results for input(s): CHOL, HDL, LDLCALC, TRIG, CHOLHDL, LDLDIRECT in the last 72 hours. Thyroid Function Tests: No results for input(s): TSH, T4TOTAL, FREET4, T3FREE, THYROIDAB in the last 72 hours. Anemia Panel: No results for input(s): VITAMINB12, FOLATE, FERRITIN, TIBC, IRON, RETICCTPCT in the last 72 hours. Sepsis Labs: No results for input(s): PROCALCITON, LATICACIDVEN in the last 168 hours.  Recent Results (from the past 240 hour(s))  SARS Coronavirus 2 (TAT 6-24 hrs)     Status: None   Collection Time: 04/18/21 12:00 AM  Result Value Ref Range Status   SARS Coronavirus 2 RESULT: NEGATIVE  Final    Comment: RESULT: NEGATIVESARS-CoV-2 INTERPRETATION:A NEGATIVE  test result means that SARS-CoV-2 RNA was not present in the specimen above the  limit of detection of this test. This does not preclude a possible SARS-CoV-2 infection and should not be used as the  sole basis for patient management decisions. Negative results must be combined with clinical observations, patient history, and epidemiological information. Optimum specimen types and timing for peak viral levels during infections caused by SARS-CoV-2  have not been determined. Collection of multiple specimens or types of specimens may be necessary to detect virus. Improper specimen collection and handling, sequence variability under primers/probes, or organism present below the limit of detection may  lead to false negative results. Positive and negative predictive values of testing are highly dependent on prevalence. False negative test results are more likely when prevalence of disease is high.The expected result is NEGATIVE.Fact S heet for  Healthcare Providers: LocalChronicle.no Sheet for Patients: SalonLookup.es Reference Range - Negative   Aerobic/Anaerobic Culture w Gram Stain (surgical/deep wound)     Status: None (Preliminary result)   Collection Time: 04/21/21 10:11 AM   Specimen: Lung, Left Upper Lobe; Tissue  Result Value Ref Range Status   Specimen Description TISSUE  Final   Special Requests LUL TRANSBRONCHIAL BIOPSIES SPEC D  Final   Gram Stain   Final    RARE WBC PRESENT, PREDOMINANTLY MONONUCLEAR NO ORGANISMS SEEN    Culture   Final    NO GROWTH 4 DAYS NO ANAEROBES ISOLATED; CULTURE IN PROGRESS FOR 5 DAYS Performed at [facility] [address], [place], [place] [address]    Report Status PENDING  Incomplete  Acid Fast Smear (AFB)     Status: None   Collection Time: 04/21/21 10:11 AM   Specimen: Lung, Left Upper Lobe; Tissue  Result Value Ref Range Status   AFB Specimen Processing Concentration  Final   Acid Fast Smear Negative  Final    Comment: (NOTE) Performed At: [facility] [place], [place] [id number] Rush Farmer MD DQ:[id number]    Source (AFB) TISSUE  Final    Comment: LUL TRANSBRONCHIAL BIOPSIES SPEC D Performed at [facility], [place] [address] [place], [place] [address]   Culture, BAL-quantitative w Gram Stain     Status: Abnormal   Collection Time: 04/21/21 10:16 AM   Specimen: Bronchial Alveolar Lavage; Respiratory  Result Value Ref Range Status   Specimen Description BRONCHIAL ALVEOLAR LAVAGE  Final   Special Requests LUL BAL SPEC F  Final   Gram Stain   Final    MODERATE WBC PRESENT,BOTH PMN AND MONONUCLEAR NO ORGANISMS SEEN    Culture (A)  Final    6,000 COLONIES/mL Normal respiratory flora-no Staph aureus or Pseudomonas seen Performed at [facility], [address], [place], [place] [address]    Report Status 04/23/2021 FINAL  Final  Aerobic/Anaerobic Culture w Gram Stain (surgical/deep wound)     Status: None (Preliminary result)   Collection Time: 04/21/21 10:16 AM   Specimen: Bronchial Alveolar Lavage; Respiratory  Result Value Ref Range Status   Specimen Description BRONCHIAL ALVEOLAR LAVAGE  Final   Special Requests LUL BAL SPEC F  Final   Gram Stain   Final    FEW WBC PRESENT,BOTH PMN AND MONONUCLEAR NO ORGANISMS SEEN Performed at [facility], [address] [place], [place] [address]    Culture   Final    RARE Consistent with normal respiratory flora. NO ANAEROBES ISOLATED; CULTURE IN PROGRESS FOR 5 DAYS    Report Status PENDING  Incomplete  Acid Fast Smear (AFB)     Status: None   Collection Time: 04/21/21 10:16 AM   Specimen: Bronchial Alveolar Lavage; Respiratory  Result Value Ref Range Status   AFB Specimen Processing Concentration  Final   Acid Fast Smear Negative  Final    Comment: (NOTE) Performed At: [facility] [place], [place] [id number] Rush Farmer MD VZ:[id number]    Source (AFB) BRONCHIAL ALVEOLAR LAVAGE  Final    Comment: LUL BAL  SPEC F Performed at [facility], [place] [address], [place], [place] [address]           Radiology Studies: DG CHEST PORT 1 VIEW  Result Date: 04/25/2021 CLINICAL DATA:  Chest tube, pneumothorax EXAM: PORTABLE CHEST 1 VIEW COMPARISON:  Chest x-ray 04/24/2021 FINDINGS: Left-sided chest tube in stable position. Stable small left pneumothorax. Cardiomediastinal silhouette is unchanged. Right-sided central venous port with the tip in the SVC. Stable prominent interstitial lung markings. Grossly stable dense opacities in the right upper and lower lung zones and pleural effusion along the  peripheral right lung. IMPRESSION: No significant change since previous study. Stable small left pneumothorax. Electronically Signed   By: Ofilia Neas M.D.   On: 04/25/2021 08:09   DG CHEST PORT 1 VIEW  Result Date: 04/24/2021 CLINICAL DATA:  Pleuritic pain.  Left chest pain. EXAM: PORTABLE CHEST 1 VIEW COMPARISON:  April 24, 2021 FINDINGS: The left chest tube is stable. The previously identified left pneumothorax is much smaller in the interval with only a tiny pneumothorax remaining. Cardiomediastinal silhouette is stable. The right Port-A-Cath is stable. Stable right pulmonary infiltrates. IMPRESSION: 1. Stable right pulmonary infiltrates. 2. Stable left chest tube. A tiny left pneumothorax remains, smaller in the interval. Electronically Signed   By: Dorise Bullion III M.D.   On: 04/24/2021 18:25   DG CHEST PORT 1 VIEW  Result Date: 04/24/2021 CLINICAL DATA:  Shortness of breath. EXAM: PORTABLE CHEST 1 VIEW COMPARISON:  April 23, 2021 FINDINGS: Left-sided chest tube in stable position. There has been interval increase in the left-sided pneumothorax which now occupies approximately 15% of the left hemithorax. Stable right-sided Port-A-Cath. Stable confluent opacities throughout the right lung and right pleural effusion. The cardiomediastinal silhouette is normal. IMPRESSION: Interval increase in  left-sided pneumothorax which now occupies approximately 15% of the left hemithorax. Stable position of the left chest tube. Stable appearance of the right-sided pulmonary opacities and right pleural effusion. Electronically Signed   By: Fidela Salisbury M.D.   On: 04/24/2021 09:50        Scheduled Meds:  amLODipine  5 mg Oral Daily   aspirin  81 mg Oral Daily   B-complex with vitamin C  1 tablet Oral Daily   carvedilol  12.5 mg Oral BID WC   chlorthalidone  25 mg Oral Daily   cyclobenzaprine  5 mg Oral QHS   dorzolamide-timolol  1 drop Both Eyes BID   [START ON 04/26/2021] enoxaparin (LOVENOX) injection  60 mg Subcutaneous Q12H   ezetimibe  10 mg Oral Daily   [START ON 04/26/2021] ferrous sulfate  325 mg Oral Q breakfast   FLUoxetine  10 mg Oral Daily   folic acid  1 mg Oral Daily   guaiFENesin  1,200 mg Oral BID   ibuprofen  600 mg Oral Once   isosorbide mononitrate  30 mg Oral Daily   latanoprost  1 drop Both Eyes QHS   lidocaine  1 patch Transdermal Q24H   osimertinib mesylate  80 mg Oral Daily   oxyCODONE  10 mg Oral Q12H   pantoprazole  40 mg Oral Daily   polyethylene glycol  17 g Oral Daily   senna  1 tablet Oral Daily   sodium chloride flush  10 mL Other Q8H   sucralfate  1 g Oral TID WC & HS   Continuous Infusions:   LOS: 4 days    Time spent: 35 Minutes.     Elmarie Shiley, MD [facility]   If 7PM-7AM, please contact night-coverage [email or web address]  04/25/2021, 1:56 PM

## 2021-04-25 NOTE — Progress Notes (Signed)
   NAME:  Allison [name], MRN:  [id number], DOB:  Oct 21, 1950, LOS: 4 ADMISSION DATE:  04/21/2021, CONSULTATION DATE:  11/3 REFERRING MD:  Valeta Harms, CHIEF COMPLAINT:  Post bronchoscopy pneumothorax   History of Present Illness:  70 y/o female with a complicated past medical history admitted after a post bronchoscopy pneumothorax.  Pertinent  Medical History  Stage IV adenocarcinoma of lung with malignant effusion, history of pleur-x; Rx chemo-rad right lung Anxiety DVT/PE 2022 Stroke 2022, felt to be eliquis failure GERD Hypertension Depression  Significant Hospital Events: Including procedures, antibiotic start and stop dates in addition to other pertinent events   11/3 robotic biopsy right lung nodule, left upper lobe infiltrate worrisome for lymphangitic spread; complicated by left pneumothorax, chest tube placed Complains of a lot of pain in the left chest, has air leak 11/5 Small apical left PTX 11/6 Increased left PTX on waterseal. Returned to suction with resolution however called to bedside for chest pain in PM. Chest tube found clamped. CXR with stable tiny PTX. Given pain meds. CXR remains stable today  Interim History / Subjective:  Yesterday increased left PTX on waterseal. Returned to suction with resolution however called to bedside for chest pain in PM. Chest tube found clamped. CXR with stable tiny PTX. Given pain meds. CXR remains stable today  Reports small volume hemoptysis this morning. Describes as bright red blood mixed with mucous  Objective   Blood pressure 118/73, pulse 80, temperature 98.7 F (37.1 C), temperature source Oral, resp. rate 17, height 5\' 4"  (1.626 m), weight 53.8 kg, SpO2 99 %.        Intake/Output Summary (Last 24 hours) at 04/25/2021 0910 Last data filed at 04/24/2021 1830 Gross per 24 hour  Intake --  Output 0 ml  Net 0 ml   Filed Weights   04/22/21 0519 04/23/21 0357 04/24/21 0500  Weight: 54.2 kg 54.7 kg 53.8 kg    Physical  Exam: General: Thin, frail-appearing, no acute distress HENT: [place], AT, OP clear, MMM Eyes: EOMI, no scleral icterus Respiratory: Clear to auscultation bilaterally.  No crackles, wheezing or rales. Left chest tube clamped Cardiovascular: RRR, -M/R/G, no JVD Extremities:-Edema,-tenderness Neuro: AAO x4, CNII-XII grossly intact Psych: Normal mood, normal affect  CXR 04/25/21 - Stable small left PTX while clamped  Resolved Hospital Problem list     Assessment & Plan:  Stage IV lung cancer with mets to pleura Postprocedural L PTX Abnormal CT L lung, biopsy pending PE/DVT dx 01/2021 Anxiety and pain- better today Hx VTE  - Remove chest tube - PM CXR ordered - Continue pain control as ordered - Recommend restarting Lovenox in am, eliquis on DC - Appreciate palliative input  - Of note family/patient would like observation overnight after chest tube pulled to ensure no recurrence  Best Practice (right click and "Reselect all SmartList Selections" daily)   Per primary  Care Time: 35 min  Rodman Pickle, M.D. [facility] Pulmonary/Critical Care Medicine 04/25/2021 9:10 AM   See [name] for personal pager For hours between 7 PM to 7 AM, please call Elink for urgent questions

## 2021-04-25 NOTE — Progress Notes (Signed)
   04/25/21 1135  Mobility  Activity Refused mobility (Pt at bedside declined mobility)

## 2021-04-25 NOTE — Progress Notes (Signed)
Palliative Medicine Inpatient Follow Up Note  Consulting Provider: [name], MD   Reason for consult:   Palliative Care Consult Services Palliative Medicine Consult    Symptom Management Consult  Reason for Consult? advanced lung cancer, sees Dr. Hollace [name] for palliative outpatient service. in with complication of bronchoscopy, has a lot of anxiety, conern with general direction of her condition.    HPI:  Per intake H&P --> Allison [name] is a 70 year old female, past medical history of stage IV adenocarcinoma of the lung.  Was initially diagnosed with a pleural effusion in July 2019.  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  Initially had a Pleurx catheter placed.  Had palliative radiotherapy and radiation as well as placement on Tagrisso.  Overall doing well for treatment.  Unfortunately CT imaging revealed progression of disease.  Has started chemotherapy with Dr. Earlie [name].  CT scan of the chest on 03/04/2021 was completed.  She had evidence of septal thickening nodularity extensive pleural thickening loculated right effusion concern for lymphangitic spread. Oncology requested a repeat biopsy w/ broncoscopy for further workup of possible mutations.    Palliative care has been asked to get involved to further address symptom burden in the setting of advanced disease. Allison [name] had been seen in August of this year inpatient by Allison [name].    Patient is follow outpatient by [name] Dr. Hollace [name].  Today's Discussion (04/25/2021):  *Please note that this is a verbal dictation therefore any spelling or grammatical errors are due to the "Yeager One" system interpretation.  Chart reviewed. It appears last night, Allison [name] had severe pain with chest tube train flush. Critical care was called to bedside, CXR ordered. Suspected nerve involved as the culprit of the pain. Oral oxycodone given as well as flexeril with relief.   I met with Allison [name]  and her daughter, Allison [name] at bedside. We reviewed her complications last night and that after receiving medications for pain she finally got to a point of relief.  We summarize some of the concerns from the prior days inclusive of having the chest tube removed and getting discharged too prematurely.  We reviewed that Dr. Loanne [name] will be by to remove the chest tube this morning which will hopefully cause some relief of the constant pain she is experiencing.  Allison [name] shares with me that she did get a good nights rest for the most part after everything mentioned above calmed down.   We reviewed Allison [name]'s appetite which this morning was good. She ate two hardboiled eggs, some of a fruit cup, and a cup of chicken broth.  We reviewed that in regards to polypharmacy (for symptom management) that I spoke to the primary medical team, pulmonary team, and pharmacy team yesterday to gain approval to give x1 dose of ibuprofen and initiate decadron PRN.   Allison [name] expresses great concern regarding blood thinning medications in the setting of her porta cath "oozing". I shared that she has already suffered in the past from complications of not being on blood thinners (DVT,PE, stroke) and that in the setting of her cancer she is at higher risk. She and Allison [name] share that she will speak to Dr. Loanne [name] once the chest tube is removed for additional clarity. During this time Dr. Loanne [name] had walked in to remove the chest tube.   We discussed the plan for post discharge follow up from a palliative perspective.  Questions and concerns addressed   Palliative support provided  Objective Assessment: Vital Signs Vitals:   04/25/21 0607 04/25/21 0749  BP: 129/70 118/73  Pulse: 96 80  Resp:  17  Temp: 98.6 F (37 C) 98.7 F (37.1 C)  SpO2: 98% 99%    Intake/Output Summary (Last 24 hours) at 04/25/2021 3953 Last data filed at 04/24/2021 1830 Gross per 24 hour  Intake --  Output 0 ml  Net 0 ml    Last Weight  Most  recent update: 04/24/2021  6:48 AM    Weight  53.8 kg (118 lb 9.7 oz)            Gen:  Frail elderly AA F in NAD HEENT: moist mucous membranes CV: Regular rate and rhythm  PULM: On RA, (+) L sided chest tube ABD: soft/nontender  EXT: No edema  Neuro: Alert and oriented x3   SUMMARY OF RECOMMENDATIONS   DNAR/DNI   Referred to outpatient Palliative Oncology symptom management clinic --> first appointment has been set up per Allison [name]   Referred to Dr. Elias [name] --> Email send to he and Dr. Julien [name] for a formal referral   Symptom management with daily modifications as below   Code Status/Advance Care Planning: DNAR/DNI   Symptom Management:  Generalized Anxiety: - Prozac 59m PO Qday - Increase Xanax 0.232mPO TID PRN (patient has a profound fear of needle sticks has been advised to take this prior)   Malignancy related pain: - Oxycodone 5-1075mO Q4H PRN -  Oxycontin 61m21m BID -  Stop Fentanyl  - (not being used due to poorly functioning IV) - Lidocaine patch - Heat pads PRN  Chest Tube/ Pleuritic related Pain: - As above with additional of Flexeril 5mg 42mQHS - Ibuprofen 600mg 68m1 (has been discussed with hospitalist, pulmonologist, and clinical pharmacist) Patient is ambivalent to take this - concern about bleeding risk.   Insomnia: - Present regiment of xanax and tylenol is working - Discussed moving forward the likelihood of modify  this further   Increased mucous production/phlegm: - Continue Duonebs Q6H PRN   Bowel Management: - Miralax 17gm Qday - LBM 11/3   Failure to Thrive: - Dietician consult will see today - Stop Marinol   - Will start decadron 1mg PO69mN BID for appetite stimulation (has been discussed with hospitalist, pulmonologist, and clinical pharmacist)   Activity Intolerance: - PT/OT ongoing  Polypharmacy: - Appreciate pharmacy review of medication with patient and her daughter    Emotional Support: - Therapeutic  Listening - A member of multiple support groups through Zoom  -[place] to Dr. Matt ScElias Elseitual Support: -  Allison [name] support through a member of her cancer support group who is a chaplaiClinical biochemist requested Allison [name] to offer additional support  Time Spent: 40 Grea79r than 50% of the time was spent in counseling and coordination of care ______________________________________________________________________________________ Allison [name] Cell Phone: [phone] utilize secure chat with additional questions, if there is no response within 30 minutes please call the above phone number  Palliative Medicine Team providers are available by phone from 7am to 7pm daily and can be reached through the team cell phone.  Should this patient require assistance outside of these hours, please call the patient's attending physician.

## 2021-04-25 NOTE — Progress Notes (Signed)
Initial Nutrition Assessment  DOCUMENTATION CODES:   Severe malnutrition in context of chronic illness  INTERVENTION:   Multivitamin w/ minerals daily  Ensure Enlive po Daily, each supplement provides 350 kcal and 20 grams of protein  30 ml ProSource Plus TID, each supplement provides 100 kcals and 15 grams protein.   Recommend liberalizing pt's diet to regular due to poor PO intake. Received ok from MD. Encourage good PO intake.  NUTRITION DIAGNOSIS:   Severe Malnutrition related to chronic illness as evidenced by percent weight loss, severe muscle depletion, moderate fat depletion.  GOAL:   Patient will meet greater than or equal to 90% of their needs  MONITOR:   PO intake, Supplement acceptance, Labs, Weight trends, I & O's  REASON FOR ASSESSMENT:   Consult Assessment of nutrition requirement/status  ASSESSMENT:   70 y.o. female presented for possible bronchoscopy and biopsy due to progression of Stage IV lung cancer. PMH includes GERD, HTN, Vitamin D deficiency and anemia. Pt admitted after bronchoscopy and pneumothorax.   PLDN received a consult for assessment of nutritional status and change in taste per pt.  Pt was sleeping and daughter was at bedside at time of visit; pt eventually woke and was able to provide more information.   Pt reports that he appetite is very poor even when she takes her appetite stimulant. She reports that she often feels full very fast. That all foods and liquids cause a terrible "sour" taste starting in the back of her throat. When she wants to eat or something that she enjoys, she eats fast to be able to avoid the taste alterations. Pt reports that she has not used the baking soda rinse that the Oncology dietitian recommended. Pt reports that she does not use mental utensils either. Pt continues to repeat that she used to love to eat and now she doesn't want to eat or it taste so bad. Pt reports that she will often wake up with a very dry  and hot mouth that ice chips really work. Pt reports that she cannot  Pt stated that she had to drink an Ensure yesterday just to have a bowel movement. Pt states that it takes 2-3 hours before passing through her.  Per daughter, pt will do very well the first 2 days post-chemo. Then, very poor days 3 and 4 post-chemo and will often not even get out of bed. And from day 5 and forward she will gradually improve. Daughter states that the patient does not eve neat 3 meals/day and that maybe she drinks 1 Ensure every other day.  Per EMR, pt intake includes 75% of breakfast and 25% of dinner on 11/4.   Pt reports that her UBW was 130-135# and that she now weighs 118#. Per EMR, pt has had a 8% weight loss in 3 months, which is clinically significant for time frame. Pt reports that she use to be very active and going to the gym. Pt currently uses a cane and leans on walls or furniture as needed to get around home.   Discussed tube feedings with pt and daughter; pt does not wish to have short-term tube feedings during this admission or anything permanent. Pt stated that if she was unconscious she would be ok with a tube feeding then to help her get over that "hump".  Discussed additional ONS with pt; pt agreeable to try ProSource Plus.   Provided pt with handouts to help improve intake in oncology pts.  Medications reviewed and include: Vitamin  B & C, [place], Folic Acid, Oxycodone, Protonix, Miralax, Senokot, Sucralfate Labs reviewed: Sodium 133  NUTRITION - FOCUSED PHYSICAL EXAM:  Flowsheet Row Most Recent Value  Orbital Region Moderate depletion  Upper Arm Region Mild depletion  Thoracic and Lumbar Region Mild depletion  Buccal Region Moderate depletion  Temple Region Moderate depletion  Clavicle Bone Region Moderate depletion  Clavicle and Acromion Bone Region Moderate depletion  Scapular Bone Region Moderate depletion  Dorsal Hand Moderate depletion  Patellar Region Severe depletion   Anterior Thigh Region Severe depletion  Posterior Calf Region Severe depletion  Edema (RD Assessment) None  Hair Reviewed  Eyes Reviewed  Mouth Reviewed  Skin Reviewed  Nails Reviewed       Diet Order:   Diet Order             Diet Heart Room service appropriate? Yes; Fluid consistency: Thin  Diet effective now                   EDUCATION NEEDS:   Education needs have been addressed  Skin:  Skin Assessment: Reviewed RN Assessment  Last BM:  No Documentation  Height:   Ht Readings from Last 1 Encounters:  04/21/21 5\' 4"  (1.626 m)    Weight:   Wt Readings from Last 1 Encounters:  04/24/21 53.8 kg    Ideal Body Weight:  54.6 kg  BMI:  Body mass index is 20.36 kg/m.  Estimated Nutritional Needs:   Kcal:  1650-1850  Protein:  85-100 grams  Fluid:  > 1.7 L    Jolyne Laye BS, PLDN Clinical Dietitian See [facility] for contact information.

## 2021-04-25 NOTE — Care Management Important Message (Signed)
Important Message  Patient Details  Name: Allison [name] MRN: [id number] Date of Birth: July 24, 1950   Medicare Important Message Given:  Yes     Orbie Pyo 04/25/2021, 1:24 PM

## 2021-04-25 NOTE — Progress Notes (Signed)
PT Cancellation Note  Patient Details Name: Allison [name] MRN: [id number] DOB: [birth year]   Cancelled Treatment:    Reason Eval/Treat Not Completed: Other (comment).  Pt asleep from meds, family asking PT to leave her to rest.  Retry as time and pt allow.   Ramond Dial 04/25/2021, 12:47 PM  Mee Hives, PT PhD Acute Rehab Dept. Number: [place] and [place]

## 2021-04-26 ENCOUNTER — Telehealth: Payer: Self-pay | Admitting: Medical Oncology

## 2021-04-26 ENCOUNTER — Encounter: Payer: Self-pay | Admitting: Internal Medicine

## 2021-04-26 ENCOUNTER — Other Ambulatory Visit (HOSPITAL_COMMUNITY): Payer: Self-pay

## 2021-04-26 ENCOUNTER — Inpatient Hospital Stay (HOSPITAL_COMMUNITY): Payer: Medicare HMO

## 2021-04-26 DIAGNOSIS — J939 Pneumothorax, unspecified: Secondary | ICD-10-CM | POA: Diagnosis not present

## 2021-04-26 DIAGNOSIS — Z66 Do not resuscitate: Secondary | ICD-10-CM | POA: Diagnosis not present

## 2021-04-26 DIAGNOSIS — Z515 Encounter for palliative care: Secondary | ICD-10-CM | POA: Diagnosis not present

## 2021-04-26 DIAGNOSIS — Z7189 Other specified counseling: Secondary | ICD-10-CM | POA: Diagnosis not present

## 2021-04-26 DIAGNOSIS — E43 Unspecified severe protein-calorie malnutrition: Secondary | ICD-10-CM

## 2021-04-26 DIAGNOSIS — C3491 Malignant neoplasm of unspecified part of right bronchus or lung: Secondary | ICD-10-CM | POA: Diagnosis not present

## 2021-04-26 DIAGNOSIS — R079 Chest pain, unspecified: Secondary | ICD-10-CM | POA: Diagnosis not present

## 2021-04-26 LAB — CBC
HCT: 27.3 % — ABNORMAL LOW (ref 36.0–46.0)
Hemoglobin: 8.7 g/dL — ABNORMAL LOW (ref 12.0–15.0)
MCH: 26.4 pg (ref 26.0–34.0)
MCHC: 31.9 g/dL (ref 30.0–36.0)
MCV: 83 fL (ref 80.0–100.0)
Platelets: 302 10*3/uL (ref 150–400)
RBC: 3.29 MIL/uL — ABNORMAL LOW (ref 3.87–5.11)
RDW: 16.8 % — ABNORMAL HIGH (ref 11.5–15.5)
WBC: 2 10*3/uL — ABNORMAL LOW (ref 4.0–10.5)
nRBC: 0 % (ref 0.0–0.2)

## 2021-04-26 LAB — BASIC METABOLIC PANEL
Anion gap: 10 (ref 5–15)
BUN: 11 mg/dL (ref 8–23)
CO2: 25 mmol/L (ref 22–32)
Calcium: 9.3 mg/dL (ref 8.9–10.3)
Chloride: 96 mmol/L — ABNORMAL LOW (ref 98–111)
Creatinine, Ser: 1.06 mg/dL — ABNORMAL HIGH (ref 0.44–1.00)
GFR, Estimated: 57 mL/min — ABNORMAL LOW (ref >60)
Glucose, Bld: 97 mg/dL (ref 70–99)
Potassium: 3.3 mmol/L — ABNORMAL LOW (ref 3.5–5.1)
Sodium: 131 mmol/L — ABNORMAL LOW (ref 135–145)

## 2021-04-26 LAB — AEROBIC/ANAEROBIC CULTURE W GRAM STAIN (SURGICAL/DEEP WOUND)
Culture: NO GROWTH
Culture: NORMAL

## 2021-04-26 MED ORDER — ALPRAZOLAM 0.25 MG PO TABS: 0.2500 mg | ORAL_TABLET | Freq: Three times a day (TID) | ORAL | 0 refills | Status: DC | PRN

## 2021-04-26 MED ORDER — OXYCODONE HCL ER 10 MG PO T12A: 10.0000 mg | EXTENDED_RELEASE_TABLET | Freq: Two times a day (BID) | ORAL | 0 refills | Status: DC

## 2021-04-26 MED ORDER — FLUOXETINE HCL 10 MG PO CAPS: 10.0000 mg | ORAL_CAPSULE | Freq: Every day | ORAL | 3 refills | Status: DC

## 2021-04-26 MED ORDER — ASPIRIN 81 MG PO CHEW
81.0000 mg | CHEWABLE_TABLET | Freq: Every day | ORAL | Status: DC
Start: 2021-04-26 — End: 2022-03-23

## 2021-04-26 MED ORDER — POTASSIUM CHLORIDE CRYS ER 20 MEQ PO TBCR: 40.0000 meq | EXTENDED_RELEASE_TABLET | Freq: Every day | ORAL | 0 refills | Status: DC

## 2021-04-26 MED ORDER — DEXAMETHASONE 1 MG PO TABS: 1.0000 mg | ORAL_TABLET | Freq: Two times a day (BID) | ORAL | 1 refills | Status: DC | PRN

## 2021-04-26 MED ORDER — GUAIFENESIN ER 600 MG PO TB12: 1200.0000 mg | ORAL_TABLET | Freq: Two times a day (BID) | ORAL | 10 refills | Status: DC

## 2021-04-26 MED ORDER — POTASSIUM CHLORIDE CRYS ER 20 MEQ PO TBCR
40.0000 meq | EXTENDED_RELEASE_TABLET | Freq: Once | ORAL | Status: AC
  Administered 2021-04-26: 40 meq via ORAL
  Filled 2021-04-26: qty 2

## 2021-04-26 MED ORDER — LIDOCAINE 5 % EX PTCH
1.0000 | MEDICATED_PATCH | CUTANEOUS | 0 refills | Status: DC
Start: 2021-04-26 — End: 2021-09-08

## 2021-04-26 MED ORDER — OXYCODONE HCL 5 MG PO TABS: 5.0000 mg | ORAL_TABLET | ORAL | 0 refills | Status: DC | PRN

## 2021-04-26 NOTE — Progress Notes (Signed)
Palliative Medicine Inpatient Follow Up Note  Consulting Provider: [name], MD   Reason for consult:   Palliative Care Consult Services Palliative Medicine Consult    Symptom Management Consult  Reason for Consult? advanced lung cancer, sees Dr. [name] for palliative outpatient service. in with complication of bronchoscopy, has a lot of anxiety, conern with general direction of her condition.    HPI:  Per intake H&P --> Allison [name] is a 70 year old female, past medical history of stage IV adenocarcinoma of the lung.  Was initially diagnosed with a pleural effusion in July 2019.  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  Initially had a Pleurx catheter placed.  Had palliative radiotherapy and radiation as well as placement on Tagrisso.  Overall doing well for treatment.  Unfortunately CT imaging revealed progression of disease.  Has started chemotherapy with Dr. [name].  CT scan of the chest on 03/04/2021 was completed.  She had evidence of septal thickening nodularity extensive pleural thickening loculated right effusion concern for lymphangitic spread. Oncology requested a repeat biopsy w/ broncoscopy for further workup of possible mutations.    Palliative care has been asked to get involved to further address symptom burden in the setting of advanced disease. Allison [name] had been seen in August of this year inpatient by [name].    Patient is follow outpatient by [name] Dr. [name].  Today's Discussion (04/26/2021):  Allison [name] reports she has been seen this AM by other providers and is preparing to discharge today. She is excited to discharge; however, she is feeling overwhelmed about multiple upcoming appointments - specifically many appointments Thursday at the cancer center. She does not feel as though she can go to all of these appointments. I encouraged her to call her nurse at cancer center and discuss which ones are  priority/ability to reschedule some of them.   We discussed her symptoms - she tells me the pain in her L chest is much better following chest tube  removal. Chronic pain on R side continues. We discuss her long acting pain medicine - oxycontin - discuss receiving this q12hrs. She feels this is helpful. She is also complaining of anxiety and wonders why she has not received xanax. We discuss this is ordered PRN or "as needed". We discuss she needs to request this medication. She feels as though she needs a dose now so I will notify nursing staff.   She tells me she slept much better last night.  She tells me she had a BM 2 days ago - she tells me it is not normal for her to have BMs daily. She denies feeling constipated.  She continues to drink Ensure as she tolerates.   Family member at bedside - no questions or concerns.   We discussed the plan for post discharge follow up from a palliative perspective.  Questions and concerns addressed   Palliative support provided  Objective Assessment: Vital Signs Vitals:   04/26/21 0504 04/26/21 0950  BP: (!) 122/58 119/60  Pulse: 85 77  Resp: 18 18  Temp: 98.4 F (36.9 C) 98.4 F (36.9 C)  SpO2: 98% 100%   No intake or output data in the 24 hours ending 04/26/21 1051  Last Weight  Most recent update: 04/26/2021  5:10 AM    Weight  53.1 kg (117 lb 1 oz)            Gen:  Frail elderly AA F in  NAD PULM: On RA, unlabored EXT: No edema  Neuro: Alert and oriented x3, pleasant and engaged in conversation, appears slightly anxious  SUMMARY OF RECOMMENDATIONS   DNAR/DNI   Referred to outpatient Palliative Oncology symptom management clinic --> first appointment has been set up per Athena Cousar   Referred to Dr. Elias Else --> Email send to he and Dr. Julien Nordmann for a formal referral   Symptom management with daily modifications as below   Code Status/Advance Care Planning: DNAR/DNI   Symptom Management:  Generalized Anxiety: -  Prozac 12m PO Qday - Increase Xanax 0.224mPO TID PRN (patient has a profound fear of needle sticks has been advised to take this prior) - encouraged patient to request as needed   Malignancy related pain: - Oxycodone 5-1012mO Q4H PRN -  Oxycontin 29m7m BID - Lidocaine patch - Heat pads PRN  Dc flexeril and ibuprofen as patient not taking   Increased mucous production/phlegm: - Continue Duonebs Q6H PRN   Bowel Management: - senna qhs - LBM 11/6   Failure to Thrive: - Stop Marinol   - Will start decadron 1mg 109mPRN BID for appetite stimulation (has been discussed with hospitalist, pulmonologist, and clinical pharmacist)   Activity Intolerance: - PT/OT ongoing  Polypharmacy: - Appreciate pharmacy review of medication with patient and her daughter    Emotional Support: - Therapeutic Listening - A member of multiple support groups through Zoom [place] to Dr. Matt Elias Elseiritual Support: -  PaulaTemeshaethoGuthriee gets support through a member of her cancer support group who is a chaplClinical biochemistve requested EllenDorian Pod by to offer additional support  Time Spent: 25 minutes Greater than 50%  of this time was spent counseling and coordinating care related to the above assessment and plan.  ___________________________________________________________________________________  [name] Juel Burrow, AGNP-C Palliative Medicine Team Team Phone # [phone] # [phone] Medicine Team providers are available by phone from 7am to 7pm daily and can be reached through the team cell phone.  Should this patient require assistance outside of these hours, please call the patient's attending physician.

## 2021-04-26 NOTE — Progress Notes (Addendum)
NAME:  Allison [name], MRN:  [id number], DOB:  1950-09-07, LOS: 5 ADMISSION DATE:  04/21/2021, CONSULTATION DATE:  11/3 REFERRING MD:  Valeta Harms, CHIEF COMPLAINT:  Post bronchoscopy pneumothorax   History of Present Illness:  70 y/o female with a complicated past medical history admitted after a post bronchoscopy pneumothorax.  Pertinent  Medical History  Stage IV adenocarcinoma of lung with malignant effusion, history of pleur-x; Rx chemo-rad right lung Anxiety DVT/PE 2022 Stroke 2022, felt to be eliquis failure GERD Hypertension Depression  Significant Hospital Events: Including procedures, antibiotic start and stop dates in addition to other pertinent events   11/3 robotic biopsy right lung nodule, left upper lobe infiltrate worrisome for lymphangitic spread; complicated by left pneumothorax, chest tube placed Complains of a lot of pain in the left chest, has air leak 11/5 Small apical left PTX 11/6 Increased left PTX on waterseal. Returned to suction with resolution however called to bedside for chest pain in PM. Chest tube found clamped. CXR with stable tiny PTX. Given pain meds. CXR remains stable today 04/26/2021 very very tiny pneumothorax remains to not require any interventions.  Interim History / Subjective:  No change in respiratory status this removal on chest tube Objective   Blood pressure (!) 122/58, pulse 85, temperature 98.4 F (36.9 C), temperature source Oral, resp. rate 18, height 5\' 4"  (1.626 m), weight 53.1 kg, SpO2 98 %.       No intake or output data in the 24 hours ending 04/26/21 0941  Filed Weights   04/23/21 0357 04/24/21 0500 04/26/21 0504  Weight: 54.7 kg 53.8 kg 53.1 kg    Physical Exam: General: Awake alert no acute distress HENT: No JVD or lymphadenopathy is appreciated Respiratory: Chest tube is now out.  Decreased air movement bilaterally Cardiovascular: RRR, -M/R/G, no JVD Extremities:-Edema,-tenderness Neuro: Focal  defect     Resolved Hospital Problem list     Assessment & Plan:  Stage IV lung cancer with mets to pleura Postprocedural L PTX Abnormal CT L lung, biopsy pending PE/DVT dx 01/2021 Anxiety and pain- better today Hx VTE  Ready for discharge from pulmonary standpoint No visible pneumothorax to my eye at this time. Can restart anticoagulation Pain control as needed   - Of note family/patient would like observation overnight after chest tube pulled to ensure no recurrence  Best Practice (right click and "Reselect all SmartList Selections" daily)   Richardson Landry Minor ACNP Acute Care Nurse Practitioner [place] Please consult Amion 04/26/2021, 9:41 AM   Critical care attending attestation note:  Patient seen and examined and relevant ancillary tests reviewed.  I agree with the assessment and plan of care as outlined by Minor, Grace Bushy, NP . The following reflects my independent critical care time.   Synopsis of assessment and plan: 51 y o F with stage IV adenocarcinoma of lung with malignant pleural effusion who developed pneumothorax post bronchoscopic biopsy  Chest tube was removed yesterday CXR this morning showed very slight L sided pneumothorax, patient is on RA   Physical exam: General: Chronically ill-appearing female, lying on the bed HEENT: [place]/AT, eyes anicteric.  moist mucus membranes Neuro: Alert, awake following commands Chest: Coarse breath sounds, no wheezes or rhonchi Heart: Regular rate and rhythm, no murmurs or gallops Abdomen: Soft, nontender, nondistended, bowel sounds present Skin: No rash  Labs and images were reviewed  Assessment and plan: Stage IV adenocarcinoma of lung with mets to pleura Post procedural L sided pneumothorax DVT/PE on anticoagulation  Repeat  CXR showed minimal L sided PTX Patient is asymptomatic on RA Ok to restart anticoagulation F/u with Oncology and Pulmonary as an out patient  Ok to be discharged  from [place] MD [place] See Amion for pager If no response to pager, please call [phone] until 7pm After 7pm, Please call E-link [phone]  04/26/2021, 12:21 PM

## 2021-04-26 NOTE — Progress Notes (Signed)
Occupational Therapy Treatment Patient Details Name: Allison [name] MRN: [id number] DOB: 07-Dec-1950 Today's Date: 04/26/2021   History of present illness Pt is a 70 y.o. F who presents 04/21/2021 for post bronchoscopy PTX. 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   OT comments  Pt is progressing well towards OT goals. Completing ADLs and functional transfers/mobility with supervision for safety. Pt d/c'ing home today and excited to return home. Pt stated that she was interested in exercises she could do at home. Provided pt with handout and theraband and educated on HEP. Pt and family verbalized understanding. Will continue to follow acutely.   Recommendations for follow up therapy are one component of a multi-disciplinary discharge planning process, led by the attending physician.  Recommendations may be updated based on patient status, additional functional criteria and insurance authorization.    Follow Up Recommendations  No OT follow up    Assistance Recommended at Discharge PRN  Equipment Recommendations  None recommended by OT    Recommendations for Other Services      Precautions / Restrictions Precautions Precautions: Fall Restrictions Weight Bearing Restrictions: No       Mobility Bed Mobility      General bed mobility comments: in recliner    Transfers Overall transfer level: Needs assistance Equipment used: Straight cane Transfers: Sit to/from Stand Sit to Stand: Supervision                Balance Overall balance assessment: Mild deficits observed, not formally tested                                         ADL either performed or assessed with clinical judgement   ADL Overall ADL's : Needs assistance/impaired                         Toilet Transfer: Supervision/safety;Ambulation;Grab bars;Regular Toilet            Functional mobility during ADLs: Supervision/safety;Cane General ADL Comments: Offered to assist with dressing prior to d/c, however pt's daughter present and pt stating that she can assist if needed.    Extremity/Trunk Assessment              Vision       Perception     Praxis      Cognition Arousal/Alertness: Awake/alert Behavior During Therapy: WFL for tasks assessed/performed Overall Cognitive Status: Within Functional Limits for tasks assessed                                            Exercises Exercises: General Upper Extremity (Provided handout of HEP and theraband. Educated pt on HEP. Verbalized understanding. Declining exercise as d/c'ing home soon.)   Shoulder Instructions       General Comments VSS on RA    Pertinent Vitals/ Pain       Pain Assessment: No/denies pain  Home Living                                          Prior Functioning/Environment  Frequency  Min 2X/week        Progress Toward Goals  OT Goals(current goals can now be found in the care plan section)  Progress towards OT goals: Progressing toward goals  Acute Rehab OT Goals Patient Stated Goal: return home OT Goal Formulation: With patient Time For Goal Achievement: 05/08/21 Potential to Achieve Goals: Good ADL Goals Pt Will Perform Grooming: with modified independence;standing Pt Will Perform Lower Body Bathing: with modified independence;sit to/from stand Pt Will Perform Lower Body Dressing: with modified independence;sit to/from stand Pt Will Transfer to Toilet: with modified independence;ambulating;regular height toilet  Plan Discharge plan remains appropriate;Frequency remains appropriate    Co-evaluation                 AM-PAC OT "6 Clicks" Daily Activity     Outcome Measure   Help from another person eating meals?: None Help from another person taking care of personal grooming?: A Little Help from  another person toileting, which includes using toliet, bedpan, or urinal?: A Little Help from another person bathing (including washing, rinsing, drying)?: A Little Help from another person to put on and taking off regular upper body clothing?: A Little Help from another person to put on and taking off regular lower body clothing?: A Little 6 Click Score: 19    End of Session Equipment Utilized During Treatment: Other (comment) Kasandra Knudsen)  OT Visit Diagnosis: Unsteadiness on feet (R26.81);Muscle weakness (generalized) (M62.81)   Activity Tolerance Patient tolerated treatment well   Patient Left in chair;with call bell/phone within reach;with family/visitor present   Nurse Communication Mobility status        Time: 1151-1209 OT Time Calculation (min): 18 min  Charges: OT General Charges $OT Visit: 1 Visit OT Treatments $Self Care/Home Management : 8-22 mins  Clarrissa Shimkus C, OT/L  Acute Rehab [place] 04/26/2021, 12:45 PM

## 2021-04-26 NOTE — Telephone Encounter (Signed)
  Pt did not get CT yesterday. I requested to r/s to tomorrow so pt can keep her appt with [facility] on Thursday. Message sent to Central scheduling.

## 2021-04-26 NOTE — Plan of Care (Signed)
Patient ID: Allison [name], female   DOB: 16-Oct-1950, 70 y.o.   MRN: [id number]  Problem: Education: Goal: Knowledge of General Education information will improve Description: Including pain rating scale, medication(s)/side effects and non-pharmacologic comfort measures 04/26/2021 1224 by Haydee Salter, RN Outcome: Adequate for Discharge 04/26/2021 1224 by Haydee Salter, RN Outcome: Progressing   Problem: Health Behavior/Discharge Planning: Goal: Ability to manage health-related needs will improve 04/26/2021 1224 by Haydee Salter, RN Outcome: Adequate for Discharge 04/26/2021 1224 by Haydee Salter, RN Outcome: Progressing   Problem: Clinical Measurements: Goal: Ability to maintain clinical measurements within normal limits will improve 04/26/2021 1224 by Haydee Salter, RN Outcome: Adequate for Discharge 04/26/2021 1224 by Haydee Salter, RN Outcome: Progressing Goal: Will remain free from infection 04/26/2021 1224 by Haydee Salter, RN Outcome: Adequate for Discharge 04/26/2021 1224 by Haydee Salter, RN Outcome: Progressing Goal: Diagnostic test results will improve 04/26/2021 1224 by Haydee Salter, RN Outcome: Adequate for Discharge 04/26/2021 1224 by Haydee Salter, RN Outcome: Progressing Goal: Respiratory complications will improve 04/26/2021 1224 by Haydee Salter, RN Outcome: Adequate for Discharge 04/26/2021 1224 by Haydee Salter, RN Outcome: Progressing Goal: Cardiovascular complication will be avoided 04/26/2021 1224 by Haydee Salter, RN Outcome: Adequate for Discharge 04/26/2021 1224 by Haydee Salter, RN Outcome: Progressing   Problem: Activity: Goal: Risk for activity intolerance will decrease 04/26/2021 1224 by Haydee Salter, RN Outcome: Adequate for Discharge 04/26/2021 1224 by Haydee Salter, RN Outcome: Progressing   Problem: Nutrition: Goal: Adequate nutrition will be maintained 04/26/2021 1224 by Haydee Salter, RN Outcome: Adequate for Discharge 04/26/2021 1224 by Haydee Salter, RN Outcome: Progressing   Problem: Coping: Goal: Level of anxiety will decrease 04/26/2021 1224 by Haydee Salter, RN Outcome: Adequate for Discharge 04/26/2021 1224 by Haydee Salter, RN Outcome: Progressing   Problem: Elimination: Goal: Will not experience complications related to bowel motility 04/26/2021 1224 by Haydee Salter, RN Outcome: Adequate for Discharge 04/26/2021 1224 by Haydee Salter, RN Outcome: Progressing Goal: Will not experience complications related to urinary retention 04/26/2021 1224 by Haydee Salter, RN Outcome: Adequate for Discharge 04/26/2021 1224 by Haydee Salter, RN Outcome: Progressing   Problem: Pain Managment: Goal: General experience of comfort will improve 04/26/2021 1224 by Haydee Salter, RN Outcome: Adequate for Discharge 04/26/2021 1224 by Haydee Salter, RN Outcome: Progressing   Problem: Safety: Goal: Ability to remain free from injury will improve 04/26/2021 1224 by Haydee Salter, RN Outcome: Adequate for Discharge 04/26/2021 1224 by Haydee Salter, RN Outcome: Progressing   Problem: Skin Integrity: Goal: Risk for impaired skin integrity will decrease 04/26/2021 1224 by Haydee Salter, RN Outcome: Adequate for Discharge 04/26/2021 1224 by Haydee Salter, RN Outcome: Progressing   Problem: Acute Rehab PT Goals(only PT should resolve) Goal: Patient Will Transfer Sit To/From Stand Outcome: Adequate for Discharge Goal: Pt Will Ambulate Outcome: Adequate for Discharge Goal: Pt Will Go Up/Down Stairs Outcome: Adequate for Discharge   Problem: Acute Rehab OT Goals (only OT should resolve) Goal: Pt. Will Perform Grooming Outcome: Adequate for Discharge Goal: Pt. Will Perform Lower Body Bathing Outcome: Adequate for Discharge Goal: Pt. Will Perform Lower Body Dressing Outcome: Adequate for Discharge Goal: Pt. Will  Transfer To Toilet Outcome: Adequate for Discharge   Problem: Malnutrition  (NI-5.2) Goal: Food and/or nutrient delivery Description: Individualized approach for food/nutrient provision. Outcome: Adequate for Discharge    Loukas Antonson  Norville Haggard, RN

## 2021-04-26 NOTE — TOC Benefit Eligibility Note (Signed)
Patient Teacher, English as a foreign language completed.    The patient is currently admitted and upon discharge could be taking Pradaxa 150 mg.  The current 30 day co-pay is, $134.00 due to being in Coverage Gap (donut hole).   The patient is insured through [place], [place] Patient Advocate Specialist Lillie Patient Advocate Team Direct Number: [phone]  Fax: [phone]

## 2021-04-26 NOTE — Discharge Summary (Signed)
Physician Discharge Summary  Allison [name] LPF:[id number] DOB: [birth year] DOA: 04/21/2021  PCP: [name], MD  Admit date: 04/21/2021 Discharge date: 04/26/2021  Admitted From: Home  Disposition:  Home   Recommendations for Outpatient Follow-up:  Follow up with PCP in 1-2 weeks Please obtain BMP/CBC in one week Follow up with Dr Julien Nordmann for further care of Lung  Follow up with Dr Valeta Harms post bronchoscopy, Pneumothorax.   Home Health: None   Discharge Condition: Stable.  CODE STATUS: DNR Diet recommendation: Regular   Brief/Interim Summary: 70 year old with past medical history significant for stage IV adenocarcinoma of the lung with malignant effusion, history of Pleur-X, chemo and radiation to the right lung, DVT PE 2022, a stroke 2022(felt to be Eliquis failure), hypertension, depression admitted after a  post bronchoscopy pneumothorax.    1-Left pneumothorax post bronchoscopy: -Patient had a repeated CT 9/16, evidence of septal thickening nodularity extensive pleural thickening, loculated right effusion concern for lymphangitic spread.  Oncology requested a repeat biopsy with bronchoscopy for further work-up of possible mutations. -Post bronchoscopy develop left pneumothorax. Chest pain: Pleuritic related to malignancy pneumothorax: CCM  following.  Chest x ray 11/06: Pneumothorax increased to 15 %, placed on suctioning.  -Continue with guaifenesin Chest x ray today improved. Chest tube removed.  X ray stable, small pneumothorax, evaluated by pulmonologist. OK to discharge home today.  Will provide Pain medication.    Hypertension: Continue with carvedilol, chlorthalidone.   DVT/PE and stroke history: Eliquis failure  in regards to stroke.  Patient interested in transition to oral anticoagulation. Copay for pradaxa 132 $.  Will continue with Lovenox for now. Make sure stable post biopsy in regards hemoptysis.    Anxiety, general dysphoria with progression of her  condition and recurrent complication, Pain management.  Palliative care consulted Appreciate palliative care with symptoms management with Mrs Donavan.  Continue with the lidocaine patch, Xanax as needed. Decadron 1 tablet twice daily as needed for appetite stimulant  Continue with OxyContin and oxycodone as needed   Hypokalemia: Replaced. Plan to discharge on potassium supplement.    Anemia; leukopenia; secondary to chemo.  Denies melena, hematochezia.  Discussed with Dr Julien Nordmann, he recommend Granix for leukopenia.  Patient plan to follow up with Dr Julien Nordmann and discussed further use of granix.  Hb stable.     Discharge Diagnoses:  Active Problems:   Adenocarcinoma of right lung, stage 4 (HCC)   Lung nodule   Pneumothorax after biopsy   Protein-calorie malnutrition, severe    Discharge Instructions  Discharge Instructions     Diet - low sodium heart healthy   Complete by: As directed    Discharge patient   Complete by: As directed    Discharge disposition: 01-Home or Self Care   Discharge patient date: 04/21/2021   Discharge wound care:   Complete by: As directed    Increase activity slowly   Complete by: As directed       Allergies as of 04/26/2021       Reactions   Penicillins Other (See Comments)   SYNCOPE PATIENT HAS HAD A PCN REACTION WITH IMMEDIATE RASH, FACIAL/TONGUE/THROAT SWELLING, SOB, OR LIGHTHEADEDNESS WITH HYPOTENSION:  #  #  YES  #  # Has patient had a PCN reaction causing severe rash involving mucus membranes or skin necrosis: No Has patient had a PCN reaction that required hospitalization: No Has patient had a PCN reaction occurring within the last 10 years: No   Vicodin [hydrocodone-acetaminophen] Other (See Comments)   Sped  up heart and breathing   Other Other (See Comments)   Glaucoma eye drop - turned eyes dark        Medication List     STOP taking these medications    CEPACOL CHILD SORE THROAT FORM PO   dronabinol 2.5 MG  capsule Commonly known as: MARINOL   guaiFENesin-dextromethorphan 100-10 MG/5ML syrup Commonly known as: ROBITUSSIN DM   HYDROcodone bit-homatropine 5-1.5 MG/5ML syrup Commonly known as: HYCODAN   lidocaine-prilocaine cream Commonly known as: EMLA   morphine 20 MG/5ML solution       TAKE these medications    acetaminophen 500 MG tablet Commonly known as: TYLENOL Take 1,000 mg by mouth every 6 (six) hours as needed (pain).   ALPRAZolam 0.25 MG tablet Commonly known as: XANAX Take 1 tablet (0.25 mg total) by mouth 3 (three) times daily as needed for anxiety. What changed: when to take this   amLODipine 5 MG tablet Commonly known as: NORVASC Take 5 mg by mouth daily.   aspirin 81 MG chewable tablet Chew 1 tablet (81 mg total) by mouth daily.   b complex vitamins capsule Take 1 capsule by mouth daily.   carvedilol 12.5 MG tablet Commonly known as: COREG Take 1 tablet (12.5 mg total) by mouth 2 (two) times daily with a meal.   cetirizine 5 MG tablet Commonly known as: ZYRTEC Take 5 mg by mouth daily.   chlorthalidone 25 MG tablet Commonly known as: HYGROTON Take 1 tablet (25 mg total) by mouth daily.   dexamethasone 1 MG tablet Commonly known as: DECADRON Take 1 tablet (1 mg total) by mouth 2 (two) times daily as needed (For appetite stimulation).   dorzolamide-timolol 22.3-6.8 MG/ML ophthalmic solution Commonly known as: COSOPT Place 1 drop into both eyes 2 (two) times daily.   enoxaparin 60 MG/0.6ML injection Commonly known as: LOVENOX Inject 0.6 mLs (60 mg total) into the skin every 12 (twelve) hours.   esomeprazole 20 MG capsule Commonly known as: NEXIUM Take 20 mg by mouth in the morning and at bedtime.   ezetimibe 10 MG tablet Commonly known as: ZETIA Take 10 mg by mouth daily.   FLUoxetine 10 MG capsule Commonly known as: PROZAC Take 1 capsule (10 mg total) by mouth daily.   folic acid 1 MG tablet Commonly known as: FOLVITE Take 1 tablet  (1 mg total) by mouth daily.   guaiFENesin 600 MG 12 hr tablet Commonly known as: MUCINEX Take 2 tablets (1,200 mg total) by mouth 2 (two) times daily.   ipratropium-albuterol 0.5-2.5 (3) MG/3ML Soln Commonly known as: DUONEB Take 3 mLs by nebulization every 6 (six) hours as needed (Asthma).   isosorbide mononitrate 30 MG 24 hr tablet Commonly known as: IMDUR Take 1 tablet (30 mg total) by mouth daily.   latanoprost 0.005 % ophthalmic solution Commonly known as: XALATAN Place 1 drop into both eyes at bedtime.   lidocaine 5 % Commonly known as: LIDODERM Place 1 patch onto the skin daily. Remove & Discard patch within 12 hours or as directed by MD   osimertinib mesylate 80 MG tablet Commonly known as: TAGRISSO Take 1 tablet (80 mg total) by mouth daily.   OVER THE COUNTER MEDICATION Take 4 drops by mouth daily. Viatmin d   oxyCODONE 10 mg 12 hr tablet Commonly known as: OXYCONTIN Take 1 tablet (10 mg total) by mouth every 12 (twelve) hours.   oxyCODONE 5 MG immediate release tablet Commonly known as: Oxy IR/ROXICODONE Take 1 tablet (5 mg  total) by mouth every 4 (four) hours as needed for moderate pain.   potassium chloride SA 20 MEQ tablet Commonly known as: KLOR-CON Take 2 tablets (40 mEq total) by mouth daily for 5 doses.   prochlorperazine 10 MG tablet Commonly known as: COMPAZINE Take 1 tablet (10 mg total) by mouth every 6 (six) hours as needed for nausea or vomiting.   Repatha SureClick 502 MG/ML Soaj Generic drug: Evolocumab Inject 140 mg into the skin every 14 (fourteen) days.   sucralfate 1 g tablet Commonly known as: CARAFATE Take 1 tablet (1 g total) by mouth 4 (four) times daily. with meals and at bedtime. Crush and dissolve in 10 mL of warm water prior to swallowing               Discharge Care Instructions  (From admission, onward)           Start     Ordered   04/26/21 0000  Discharge wound care:        04/26/21 7741             Follow-up Information     Curt Bears, MD. Go to.   Specialty: Oncology Contact information: [address] [place] [address] [phone]                Allergies  Allergen Reactions   Penicillins Other (See Comments)    SYNCOPE PATIENT HAS HAD A PCN REACTION WITH IMMEDIATE RASH, FACIAL/TONGUE/THROAT SWELLING, SOB, OR LIGHTHEADEDNESS WITH HYPOTENSION:  #  #  YES  #  # Has patient had a PCN reaction causing severe rash involving mucus membranes or skin necrosis: No Has patient had a PCN reaction that required hospitalization: No Has patient had a PCN reaction occurring within the last 10 years: No    Vicodin [Hydrocodone-Acetaminophen] Other (See Comments)    Sped up heart and breathing   Other Other (See Comments)    Glaucoma eye drop - turned eyes dark    Consultations: Pulmonologist Palliative care    Procedures/Studies: DG CHEST PORT 1 VIEW  Result Date: 04/26/2021 CLINICAL DATA:  Pneumothorax EXAM: PORTABLE CHEST 1 VIEW COMPARISON:  Previous studies including the examination of 04/25/2021 FINDINGS: Transverse diameter of heart is in the upper limits of normal. There is prominence of superior mediastinum with no significant change. Increased markings in the right apical region have not changed. There is homogeneous opacity in the right mid and right lower lung fields which may be due to loculated pleural effusion and possibly underlying atelectasis/pneumonia. There is crowding of markings in the medial left lower lung fields. Rest of the left lung is unremarkable. Left lateral CP angle is clear. There is decrease in size of minimal residual left pneumothorax along the lateral margin of left upper lung fields. IMPRESSION: There is decrease in size of small left pneumothorax. No significant interval changes are noted in the lung fields. Electronically Signed   By: Elmer Picker M.D.   On: 04/26/2021 08:42   DG CHEST PORT 1 VIEW  Result Date:  04/25/2021 CLINICAL DATA:  Follow-up pneumothorax EXAM: PORTABLE CHEST 1 VIEW COMPARISON:  04/25/2021, CT 04/03/2021, radiograph 04/21/2021 FINDINGS: Interim removal of left-sided chest tube. Residual small apicolateral pneumothorax without significant change since yesterday's radiograph. Right-sided central venous port tip over the SVC. Right pleural effusion and airspace opacities are unchanged. Normal cardiomediastinal silhouette. IMPRESSION: 1. Removal of left chest tube with similar size of small residual left apicolateral pneumothorax 2. Otherwise no significant change as  compared with yesterday's radiograph. Electronically Signed   By: Donavan Foil M.D.   On: 04/25/2021 17:16   DG CHEST PORT 1 VIEW  Result Date: 04/25/2021 CLINICAL DATA:  Chest tube, pneumothorax EXAM: PORTABLE CHEST 1 VIEW COMPARISON:  Chest x-ray 04/24/2021 FINDINGS: Left-sided chest tube in stable position. Stable small left pneumothorax. Cardiomediastinal silhouette is unchanged. Right-sided central venous port with the tip in the SVC. Stable prominent interstitial lung markings. Grossly stable dense opacities in the right upper and lower lung zones and pleural effusion along the peripheral right lung. IMPRESSION: No significant change since previous study. Stable small left pneumothorax. Electronically Signed   By: Ofilia Neas M.D.   On: 04/25/2021 08:09   DG CHEST PORT 1 VIEW  Result Date: 04/24/2021 CLINICAL DATA:  Pleuritic pain.  Left chest pain. EXAM: PORTABLE CHEST 1 VIEW COMPARISON:  April 24, 2021 FINDINGS: The left chest tube is stable. The previously identified left pneumothorax is much smaller in the interval with only a tiny pneumothorax remaining. Cardiomediastinal silhouette is stable. The right Port-A-Cath is stable. Stable right pulmonary infiltrates. IMPRESSION: 1. Stable right pulmonary infiltrates. 2. Stable left chest tube. A tiny left pneumothorax remains, smaller in the interval. Electronically  Signed   By: Dorise Bullion III M.D.   On: 04/24/2021 18:25   DG CHEST PORT 1 VIEW  Result Date: 04/24/2021 CLINICAL DATA:  Shortness of breath. EXAM: PORTABLE CHEST 1 VIEW COMPARISON:  April 23, 2021 FINDINGS: Left-sided chest tube in stable position. There has been interval increase in the left-sided pneumothorax which now occupies approximately 15% of the left hemithorax. Stable right-sided Port-A-Cath. Stable confluent opacities throughout the right lung and right pleural effusion. The cardiomediastinal silhouette is normal. IMPRESSION: Interval increase in left-sided pneumothorax which now occupies approximately 15% of the left hemithorax. Stable position of the left chest tube. Stable appearance of the right-sided pulmonary opacities and right pleural effusion. Electronically Signed   By: Fidela Salisbury M.D.   On: 04/24/2021 09:50   DG CHEST PORT 1 VIEW  Result Date: 04/23/2021 CLINICAL DATA:  Pneumothorax on the left EXAM: PORTABLE CHEST 1 VIEW COMPARISON:  04/22/2021 FINDINGS: Pigtail catheter on the left unchanged. Small left apical pneumothorax appears slightly larger. Left lung clear Right upper lobe and right lower lobe density is unchanged. There is associated pleural thickening or fluid on the right. Port-A-Cath tip in the SVC. IMPRESSION: Slight increase in small left apical pneumothorax Right upper lobe and right lower lobe lung densities unchanged. Electronically Signed   By: Franchot Gallo M.D.   On: 04/23/2021 11:03   DG Chest Port 1 View  Result Date: 04/22/2021 CLINICAL DATA:  Chest tube EXAM: PORTABLE CHEST 1 VIEW COMPARISON:  Chest x-ray 04/21/2021 FINDINGS: Left-sided chest tube in stable position. Right-sided central venous port stable with the tip in the SVC. Heart size is normal. Mediastinum appears similar to previous. Persistent dense opacities in the right upper and lower lung zones. Faint granular like opacities throughout the left lung most prominent in the lower  lung zone. No left pleural effusion. No pneumothorax visualized. IMPRESSION: Mildly improved aeration of the left lung since previous study. Otherwise no significant change. Electronically Signed   By: Ofilia Neas M.D.   On: 04/22/2021 08:36   DG CHEST PORT 1 VIEW  Result Date: 04/21/2021 CLINICAL DATA:  Reason for exam: Chest tube in place Hx of asthma, HTN, stroke EXAM: PORTABLE CHEST - 1 VIEW COMPARISON:  Earlier film of the same day FINDINGS: Interval pigtail  catheter placement to the lateral left hemithorax with near complete evacuation of the previously noted pneumothorax and improved aeration of the left lung. Extensive airspace opacities in the right lung as before with pleural effusion or thickening. Heart size and mediastinal contours are within normal limits. Right IJ PowerPort to the cavoatrial junction. The patient has been extubated. Visualized bones unremarkable. IMPRESSION: 1. Left chest tube placement with evacuation of pneumothorax. 2. Persistent right lung opacities and pleural effusion. Electronically Signed   By: Lucrezia Europe M.D.   On: 04/21/2021 15:20   DG CHEST PORT 1 VIEW  Result Date: 04/21/2021 CLINICAL DATA:  Post bronchoscopy with biopsy EXAM: PORTABLE CHEST 1 VIEW COMPARISON:  Portable exam 1048 hours compared to 02/13/2021 FINDINGS: Tip of endotracheal tube projects 4.1 cm above carina. RIGHT jugular Port-A-Cath with tip projecting over SVC. Coronary stent noted. Normal heart size and pulmonary vascularity. Persistent airspace opacities RIGHT upper lobe and RIGHT base. Large LEFT pneumothorax with significant collapse of LEFT lung and mild mediastinal shift LEFT to RIGHT suggesting a mild degree of tension. No acute osseous findings. IMPRESSION: Large LEFT pneumothorax with slight mediastinal shift LEFT to RIGHT suggesting a mild degree of tension. Significant collapse of LEFT lung. Persistent RIGHT lung airspace opacities. Critical Value/emergent results were called by  telephone at the time of interpretation on 04/21/2021 at 11:00 am to endoscopy suite with BRADLEY ICARD DO, who verbally acknowledged these results. They indicated patient already has had chest tube placed. Electronically Signed   By: Lavonia Dana M.D.   On: 04/21/2021 11:02   MYOCARDIAL PERFUSION IMAGING  Result Date: 03/29/2021   The study is normal. The study is low risk.   No ST deviation was noted.   Left ventricular function is normal. Nuclear stress EF: 68 %. The left ventricular ejection fraction is hyperdynamic (>65%). End diastolic cavity size is normal.   Prior study available for comparison from 03/19/2020. Normal stress nuclear study with no ischemia or infarction; gated EF 68 with normal wall motion.   IR Port Repair Central Venous Access Device  Result Date: 04/15/2021 INDICATION: 70 year old female with history of right lung adenocarcinoma status post right IJ single-lumen port placement on 03/22/2021. Since placement, the patient reports small volume hemorrhage from the incision site which was treated with silver nitrate repair approximally 1 week after placement. There has been persistent oozing from the incision which is partially separated. EXAM: Port incision revision MEDICATIONS: None. CONTRAST:  None FLUOROSCOPY TIME:  None. COMPLICATIONS: None immediate. TECHNIQUE: The procedure, risks, benefits, and alternatives were explained to the patient and informed written consent was obtained. A timeout was performed prior to the initiation of the procedure. The right upper chest was prepped and draped in standard fashion. Elliptical incision was created around the mal-apposed port incision to excise the nonhealing incision. Examination of the port pocket was performed. Copious irrigation of the port pocket was performed. The incision was then closed with a total of 5 deep dermal interrupted 3-0 Vicryl sutures, a running subcuticular 4-0 Monocryl suture, and 2 additional 2-0 ethilon horizontal  mattress buttressing sutures. Dermabond was applied. Sterile bandage was applied. FINDINGS: Mal-apposed medial aspect of the incision, likely site of prior silver nitrate application which thwarted incisional healing. No evidence of purulence or erythema about the port site. Excision of prior incision with primary repair. IMPRESSION: Technically successful excision of prior nonhealing port incision. PLAN: Return in 2 weeks for wound check and buttressing suture removal. Ruthann Cancer, MD [facility]  [facility] Electronically Signed   By: Ruthann Cancer M.D.   On: 04/15/2021 15:57   DG C-ARM BRONCHOSCOPY  Result Date: 04/21/2021 C-ARM BRONCHOSCOPY: Fluoroscopy was utilized by the requesting physician.  No radiographic interpretation.    Subjective: Pain at prior chest tube site has improved some.  She has not cough blood today.   Discharge Exam: Vitals:   04/26/21 0504 04/26/21 0950  BP: (!) 122/58 119/60  Pulse: 85 77  Resp: 18 18  Temp: 98.4 F (36.9 C) 98.4 F (36.9 C)  SpO2: 98% 100%     General: Pt is alert, awake, not in acute distress Cardiovascular: RRR, S1/S2 +, no rubs, no gallops Respiratory: no wheezing, BL air movement  Abdominal: Soft, NT, ND, bowel sounds + Extremities: no edema, no cyanosis    The results of significant diagnostics from this hospitalization (including imaging, microbiology, ancillary and laboratory) are listed below for reference.     Microbiology: Recent Results (from the past 240 hour(s))  SARS Coronavirus 2 (TAT 6-24 hrs)     Status: None   Collection Time: 04/18/21 12:00 AM  Result Value Ref Range Status   SARS Coronavirus 2 RESULT: NEGATIVE  Final    Comment: RESULT: NEGATIVESARS-CoV-2 INTERPRETATION:A NEGATIVE  test result means that SARS-CoV-2 RNA was not present in the specimen above the limit of detection of this test. This does not preclude a possible SARS-CoV-2 infection and should not be  used as the  sole basis for patient management decisions. Negative results must be combined with clinical observations, patient history, and epidemiological information. Optimum specimen types and timing for peak viral levels during infections caused by SARS-CoV-2  have not been determined. Collection of multiple specimens or types of specimens may be necessary to detect virus. Improper specimen collection and handling, sequence variability under primers/probes, or organism present below the limit of detection may  lead to false negative results. Positive and negative predictive values of testing are highly dependent on prevalence. False negative test results are more likely when prevalence of disease is high.The expected result is NEGATIVE.Fact S heet for  Healthcare Providers: LocalChronicle.no Sheet for Patients: SalonLookup.es Reference Range - Negative   Fungus Culture With Stain     Status: None (Preliminary result)   Collection Time: 04/21/21 10:11 AM   Specimen: Lung, Left Upper Lobe; Tissue  Result Value Ref Range Status   Fungus Stain Final report  Final    Comment: (NOTE) Performed At: [facility] [place], [place] [id number] Rush Farmer MD [id number]    Fungus (Mycology) Culture PENDING  Incomplete   Fungal Source TISSUE  Final    Comment: LUL TRANSBRONCHIAL BIOPSIES SPEC D Performed at [facility], [place] [address], [place], [place] [address]   Aerobic/Anaerobic Culture w Gram Stain (surgical/deep wound)     Status: None (Preliminary result)   Collection Time: 04/21/21 10:11 AM   Specimen: Lung, Left Upper Lobe; Tissue  Result Value Ref Range Status   Specimen Description TISSUE  Final   Special Requests LUL TRANSBRONCHIAL BIOPSIES SPEC D  Final   Gram Stain   Final    RARE WBC PRESENT, PREDOMINANTLY MONONUCLEAR NO ORGANISMS SEEN    Culture   Final    NO GROWTH 4 DAYS NO  ANAEROBES ISOLATED; CULTURE IN PROGRESS FOR 5 DAYS Performed at [facility], [place] [address] [place], [place] [address]    Report Status PENDING  Incomplete  Acid Fast Smear (AFB)  Status: None   Collection Time: 04/21/21 10:11 AM   Specimen: Lung, Left Upper Lobe; Tissue  Result Value Ref Range Status   AFB Specimen Processing Concentration  Final   Acid Fast Smear Negative  Final    Comment: (NOTE) Performed At: [facility] [place], [place] [id number] Rush Farmer MD WU:[id number]    Source (AFB) TISSUE  Final    Comment: LUL TRANSBRONCHIAL BIOPSIES SPEC D Performed at [facility], [place] [address] [place], [place] [address]   Fungus Culture Result     Status: None   Collection Time: 04/21/21 10:11 AM  Result Value Ref Range Status   Result 1 Comment  Final    Comment: (NOTE) KOH/Calcofluor preparation:  no fungus observed. Performed At: [facility] [place], [place] [id number] Rush Farmer MD [id number]   Fungus Culture With Stain     Status: None (Preliminary result)   Collection Time: 04/21/21 10:16 AM   Specimen: Bronchial Alveolar Lavage; Respiratory  Result Value Ref Range Status   Fungus Stain Final report  Final    Comment: (NOTE) Performed At: [facility] [place], [place] [id number] Rush Farmer MD [id number]    Fungus (Mycology) Culture PENDING  Incomplete   Fungal Source BRONCHIAL ALVEOLAR LAVAGE  Final    Comment: LUL BAL SPEC F Performed at [facility], [place] [address] [place], [place] [address]   Culture, BAL-quantitative w Gram Stain     Status: Abnormal   Collection Time: 04/21/21 10:16 AM   Specimen: Bronchial Alveolar Lavage; Respiratory  Result Value Ref Range Status   Specimen Description BRONCHIAL ALVEOLAR LAVAGE  Final   Special Requests LUL BAL SPEC F  Final   Gram Stain   Final    MODERATE WBC PRESENT,BOTH PMN AND MONONUCLEAR NO  ORGANISMS SEEN    Culture (A)  Final    6,000 COLONIES/mL Normal respiratory flora-no Staph aureus or Pseudomonas seen Performed at [facility], [address], [place], [place] [address]    Report Status 04/23/2021 FINAL  Final  Aerobic/Anaerobic Culture w Gram Stain (surgical/deep wound)     Status: None (Preliminary result)   Collection Time: 04/21/21 10:16 AM   Specimen: Bronchial Alveolar Lavage; Respiratory  Result Value Ref Range Status   Specimen Description BRONCHIAL ALVEOLAR LAVAGE  Final   Special Requests LUL BAL SPEC F  Final   Gram Stain   Final    FEW WBC PRESENT,BOTH PMN AND MONONUCLEAR NO ORGANISMS SEEN Performed at [facility], [address] [place], [place] [address]    Culture   Final    RARE Consistent with normal respiratory flora. NO ANAEROBES ISOLATED; CULTURE IN PROGRESS FOR 5 DAYS    Report Status PENDING  Incomplete  Acid Fast Smear (AFB)     Status: None   Collection Time: 04/21/21 10:16 AM   Specimen: Bronchial Alveolar Lavage; Respiratory  Result Value Ref Range Status   AFB Specimen Processing Concentration  Final   Acid Fast Smear Negative  Final    Comment: (NOTE) Performed At: [facility] [place], [place] [id number] Rush Farmer MD ZS:[id number]    Source (AFB) BRONCHIAL ALVEOLAR LAVAGE  Final    Comment: LUL BAL SPEC F Performed at [facility], [place] [address] [place], [place] [address]   Fungus Culture Result     Status: None   Collection Time: 04/21/21 10:16 AM  Result Value  Ref Range Status   Result 1 Comment  Final    Comment: (NOTE) KOH/Calcofluor preparation:  no fungus observed. Performed At: [facility] [place], [place] [id number] Rush Farmer MD [id number]      Labs: BNP (last 3 results) Recent Labs    02/07/21 1640  BNP 08.6   Basic Metabolic Panel: Recent Labs  Lab 04/20/21 1118 04/21/21 0717 04/22/21 0906 04/23/21 0140  04/25/21 0051 04/26/21 0333  NA 140 136 133* 136 133* 131*  K 2.9* 2.8* 3.1* 3.4* 3.8 3.3*  CL 100 97* 98 102 99 96*  CO2 27  --  _0 GLUCOSE 103* 105* 131* 95 104* 97  BUN _1 CREATININE 0.94 1.20* 1.27* 0.90 1.06* 1.06*  CALCIUM 9.7  --  9.4 8.9 9.0 9.3   Liver Function Tests: Recent Labs  Lab 04/20/21 1118  AST 37  ALT 52*  ALKPHOS 76  BILITOT <0.2*  PROT 7.2  ALBUMIN 2.7*   No results for input(s): LIPASE, AMYLASE in the last 168 hours. No results for input(s): AMMONIA in the last 168 hours. CBC: Recent Labs  Lab 04/20/21 1118 04/21/21 0717 04/25/21 0051 04/26/21 0333  WBC 1.6*  --  1.7* 2.0*  NEUTROABS 0.9*  --   --   --   HGB 9.4* 9.5* 8.0* 8.7*  HCT 28.9* 28.0* 24.9* 27.3*  MCV 82.1  --  81.9 83.0  PLT 92*  --  232 302   Cardiac Enzymes: No results for input(s): CKTOTAL, CKMB, CKMBINDEX, TROPONINI in the last 168 hours. BNP: Invalid input(s): POCBNP CBG: Recent Labs  Lab 04/21/21 0631  GLUCAP 94   D-Dimer No results for input(s): DDIMER in the last 72 hours. Hgb A1c No results for input(s): HGBA1C in the last 72 hours. Lipid Profile No results for input(s): CHOL, HDL, LDLCALC, TRIG, CHOLHDL, LDLDIRECT in the last 72 hours. Thyroid function studies No results for input(s): TSH, T4TOTAL, T3FREE, THYROIDAB in the last 72 hours.  Invalid input(s): FREET3 Anemia work up No results for input(s): VITAMINB12, FOLATE, FERRITIN, TIBC, IRON, RETICCTPCT in the last 72 hours. Urinalysis    Component Value Date/Time   COLORURINE STRAW (A) 02/07/2021 2050   APPEARANCEUR CLEAR 02/07/2021 2050   LABSPEC 1.005 02/07/2021 2050   PHURINE 6.0 02/07/2021 2050   GLUCOSEU NEGATIVE 02/07/2021 2050   HGBUR MODERATE (A) 02/07/2021 2050   [place] NEGATIVE 02/07/2021 2050   KETONESUR NEGATIVE 02/07/2021 2050   PROTEINUR NEGATIVE 02/07/2021 2050   NITRITE NEGATIVE 02/07/2021 2050   LEUKOCYTESUR NEGATIVE 02/07/2021 2050   Sepsis  Labs Invalid input(s): PROCALCITONIN,  WBC,  LACTICIDVEN Microbiology Recent Results (from the past 240 hour(s))  SARS Coronavirus 2 (TAT 6-24 hrs)     Status: None   Collection Time: 04/18/21 12:00 AM  Result Value Ref Range Status   SARS Coronavirus 2 RESULT: NEGATIVE  Final    Comment: RESULT: NEGATIVESARS-CoV-2 INTERPRETATION:A NEGATIVE  test result means that SARS-CoV-2 RNA was not present in the specimen above the limit of detection of this test. This does not preclude a possible SARS-CoV-2 infection and should not be used as the  sole basis for patient management decisions. Negative results must be combined with clinical observations, patient history, and epidemiological information. Optimum specimen types and timing for peak viral levels during infections caused by SARS-CoV-2  have not been determined. Collection of multiple specimens or types of specimens may be necessary to detect virus. Improper specimen collection  and handling, sequence variability under primers/probes, or organism present below the limit of detection may  lead to false negative results. Positive and negative predictive values of testing are highly dependent on prevalence. False negative test results are more likely when prevalence of disease is high.The expected result is NEGATIVE.Fact S heet for  Healthcare Providers: LocalChronicle.no Sheet for Patients: SalonLookup.es Reference Range - Negative   Fungus Culture With Stain     Status: None (Preliminary result)   Collection Time: 04/21/21 10:11 AM   Specimen: Lung, Left Upper Lobe; Tissue  Result Value Ref Range Status   Fungus Stain Final report  Final    Comment: (NOTE) Performed At: [facility] [place], [place] [id number] Rush Farmer MD [id number]    Fungus (Mycology) Culture PENDING  Incomplete   Fungal Source TISSUE  Final    Comment: LUL TRANSBRONCHIAL BIOPSIES  SPEC D Performed at [facility], [place] [address], [place], [place] [address]   Aerobic/Anaerobic Culture w Gram Stain (surgical/deep wound)     Status: None (Preliminary result)   Collection Time: 04/21/21 10:11 AM   Specimen: Lung, Left Upper Lobe; Tissue  Result Value Ref Range Status   Specimen Description TISSUE  Final   Special Requests LUL TRANSBRONCHIAL BIOPSIES SPEC D  Final   Gram Stain   Final    RARE WBC PRESENT, PREDOMINANTLY MONONUCLEAR NO ORGANISMS SEEN    Culture   Final    NO GROWTH 4 DAYS NO ANAEROBES ISOLATED; CULTURE IN PROGRESS FOR 5 DAYS Performed at [facility], [place] [address] [place], [place] [address]    Report Status PENDING  Incomplete  Acid Fast Smear (AFB)     Status: None   Collection Time: 04/21/21 10:11 AM   Specimen: Lung, Left Upper Lobe; Tissue  Result Value Ref Range Status   AFB Specimen Processing Concentration  Final   Acid Fast Smear Negative  Final    Comment: (NOTE) Performed At: [facility] [place], [place] [id number] Rush Farmer MD ZL:[id number]    Source (AFB) TISSUE  Final    Comment: LUL TRANSBRONCHIAL BIOPSIES SPEC D Performed at [facility], [place] [address] [place], [place] [address]   Fungus Culture Result     Status: None   Collection Time: 04/21/21 10:11 AM  Result Value Ref Range Status   Result 1 Comment  Final    Comment: (NOTE) KOH/Calcofluor preparation:  no fungus observed. Performed At: [facility] [place], [place] [id number] Rush Farmer MD [id number]   Fungus Culture With Stain     Status: None (Preliminary result)   Collection Time: 04/21/21 10:16 AM   Specimen: Bronchial Alveolar Lavage; Respiratory  Result Value Ref Range Status   Fungus Stain Final report  Final    Comment: (NOTE) Performed At: [facility] [place], [place] [id number] Rush Farmer MD JG:[id number]    Fungus (Mycology) Culture PENDING   Incomplete   Fungal Source BRONCHIAL ALVEOLAR LAVAGE  Final    Comment: LUL BAL SPEC F Performed at [facility], [place] [address], [place], [place] [address]   Culture, BAL-quantitative w Gram Stain     Status: Abnormal   Collection Time: 04/21/21 10:16 AM   Specimen: Bronchial Alveolar Lavage; Respiratory  Result Value Ref Range Status   Specimen Description BRONCHIAL ALVEOLAR LAVAGE  Final   Special Requests LUL BAL SPEC F  Final   Gram Stain  Final    MODERATE WBC PRESENT,BOTH PMN AND MONONUCLEAR NO ORGANISMS SEEN    Culture (A)  Final    6,000 COLONIES/mL Normal respiratory flora-no Staph aureus or Pseudomonas seen Performed at [facility], [address], [place], [place] [address]    Report Status 04/23/2021 FINAL  Final  Aerobic/Anaerobic Culture w Gram Stain (surgical/deep wound)     Status: None (Preliminary result)   Collection Time: 04/21/21 10:16 AM   Specimen: Bronchial Alveolar Lavage; Respiratory  Result Value Ref Range Status   Specimen Description BRONCHIAL ALVEOLAR LAVAGE  Final   Special Requests LUL BAL SPEC F  Final   Gram Stain   Final    FEW WBC PRESENT,BOTH PMN AND MONONUCLEAR NO ORGANISMS SEEN Performed at [facility], [address], [place], [place] [address]    Culture   Final    RARE Consistent with normal respiratory flora. NO ANAEROBES ISOLATED; CULTURE IN PROGRESS FOR 5 DAYS    Report Status PENDING  Incomplete  Acid Fast Smear (AFB)     Status: None   Collection Time: 04/21/21 10:16 AM   Specimen: Bronchial Alveolar Lavage; Respiratory  Result Value Ref Range Status   AFB Specimen Processing Concentration  Final   Acid Fast Smear Negative  Final    Comment: (NOTE) Performed At: [facility] [place], [place] [id number] Rush Farmer MD [id number]    Source (AFB) BRONCHIAL ALVEOLAR LAVAGE  Final    Comment: LUL BAL SPEC F Performed at [facility], [place] [address], [place], [place]  [address]   Fungus Culture Result     Status: None   Collection Time: 04/21/21 10:16 AM  Result Value Ref Range Status   Result 1 Comment  Final    Comment: (NOTE) KOH/Calcofluor preparation:  no fungus observed. Performed At: [facility] [place], [place] [id number] Rush Farmer MD ZE:[id number]      Time coordinating discharge: 40 minutes  SIGNED:   Elmarie Shiley, MD  [facility]

## 2021-04-26 NOTE — Progress Notes (Signed)
This chaplain responded to PMT consult for gentle support before discharge.  The Pt. grandson is at the bedside and the Pt. is expecting her daughter soon.  The chaplain has read the chart and understands from the Pt. she is anxious and hopeful as she prepares for discharge. The Pt. describes her anxiety in understanding her medical care and hope in desiring to find the peace in relationship with her cancer support groups.  The chaplain understands the Pt. is looking forward to continuing her relationship with Jovita Kussmaul at [facility].   Chaplain Sallyanne Kuster [phone]

## 2021-04-26 NOTE — Progress Notes (Signed)
Physical Therapy Treatment Patient Details Name: Allison [name] MRN: [id number] DOB: April 14, 1951 Today's Date: 04/26/2021   History of Present Illness Pt is a 70 y.o. F who presents 04/21/2021 for post bronchoscopy PTX. 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    PT Comments    Patient progressing well with mobility. Reports feeling well this morning and eager to return home. Mobilizing at supervision level with use of SPC for support. Noted to have some mild coughing with mobility. VSS on RA. Balance seems improved as well. Discussed importance of exercise/activity at home doing multiple shorter bouts of exercise with longer rest breaks and to continue to use IS and flutter valve. Pt verbalizes understanding. Will continue to follow if still in the hospital.     Recommendations for follow up therapy are one component of a multi-disciplinary discharge planning process, led by the attending physician.  Recommendations may be updated based on patient status, additional functional criteria and insurance authorization.  Follow Up Recommendations  No PT follow up     Assistance Recommended at Discharge PRN  Equipment Recommendations  None recommended by PT    Recommendations for Other Services       Precautions / Restrictions Precautions Precautions: Fall Restrictions Weight Bearing Restrictions: No     Mobility  Bed Mobility Overal bed mobility: Needs Assistance Bed Mobility: Supine to Sit     Supine to sit: Supervision;HOB elevated     General bed mobility comments: supervision for safety.    Transfers Overall transfer level: Needs assistance Equipment used: Straight cane Transfers: Sit to/from Stand Sit to Stand: Supervision           General transfer comment: SUpervision for safety. Stood from [employer], from toilet x1, transferred to chair post ambulation.     Ambulation/Gait Ambulation/Gait assistance: Supervision Gait Distance (Feet): 110 Feet Assistive device: Straight cane Gait Pattern/deviations: Step-through pattern;Decreased stride length Gait velocity: decreased     General Gait Details: slow, mostly steady gait with SPC, cues to increase step lengths. Mild coughing with mobility, HR stable 90s-100s.   Stairs             Wheelchair Mobility    Modified Rankin (Stroke Patients Only)       Balance Overall balance assessment: Mild deficits observed, not formally tested                                          Cognition Arousal/Alertness: Awake/alert Behavior During Therapy: WFL for tasks assessed/performed Overall Cognitive Status: Within Functional Limits for tasks assessed                                          Exercises      General Comments General comments (skin integrity, edema, etc.): VSS on RA      Pertinent Vitals/Pain Pain Assessment: No/denies pain    Home Living                          Prior Function            PT Goals (current goals can now be found in the care plan section) Progress towards PT goals: Progressing toward  goals    Frequency    Min 3X/week      PT Plan Current plan remains appropriate    Co-evaluation              AM-PAC PT "6 Clicks" Mobility   Outcome Measure  Help needed turning from your back to your side while in a flat bed without using bedrails?: None Help needed moving from lying on your back to sitting on the side of a flat bed without using bedrails?: A Little Help needed moving to and from a bed to a chair (including a wheelchair)?: A Little Help needed standing up from a chair using your arms (e.g., wheelchair or bedside chair)?: A Little Help needed to walk in hospital room?: A Little Help needed climbing 3-5 steps with a railing? : A Little 6 Click Score: 19    End of Session   Activity  Tolerance: Patient tolerated treatment well Patient left: in chair;with call bell/phone within reach Nurse Communication: Mobility status PT Visit Diagnosis: Unsteadiness on feet (R26.81);Muscle weakness (generalized) (M62.81)     Time: 1610-9604 PT Time Calculation (min) (ACUTE ONLY): 15 min  Charges:  $Gait Training: 8-22 mins                     Marisa Severin, PT, DPT Acute Rehabilitation Services Pager [phone] Office [place] 04/26/2021, 9:01 AM

## 2021-04-26 NOTE — Progress Notes (Signed)
Patient ID: Allison [name], female   DOB: 08-17-1950, 71 y.o.   MRN: [id number] An After Visit Summary was printed and given to the patient. Patient education given on medication changes, follow up appointments, and incision care and the patient expresses understanding and acceptance of instructions. Daughter to drive home.  Haydee Salter 04/26/2021 3:39 PM

## 2021-04-27 ENCOUNTER — Ambulatory Visit: Payer: Medicare HMO

## 2021-04-27 ENCOUNTER — Encounter (HOSPITAL_BASED_OUTPATIENT_CLINIC_OR_DEPARTMENT_OTHER): Payer: Self-pay

## 2021-04-27 ENCOUNTER — Other Ambulatory Visit: Payer: Self-pay

## 2021-04-27 ENCOUNTER — Ambulatory Visit (HOSPITAL_BASED_OUTPATIENT_CLINIC_OR_DEPARTMENT_OTHER)
Admission: RE | Admit: 2021-04-27 | Discharge: 2021-04-27 | Disposition: A | Discharge status: Home / Self Care | Payer: Medicare HMO | Source: Ambulatory Visit | Attending: Physician Assistant | Admitting: Physician Assistant

## 2021-04-27 ENCOUNTER — Ambulatory Visit (HOSPITAL_COMMUNITY): Payer: Medicare HMO

## 2021-04-27 DIAGNOSIS — C3490 Malignant neoplasm of unspecified part of unspecified bronchus or lung: Secondary | ICD-10-CM | POA: Diagnosis not present

## 2021-04-27 DIAGNOSIS — J939 Pneumothorax, unspecified: Secondary | ICD-10-CM | POA: Diagnosis not present

## 2021-04-27 DIAGNOSIS — C3491 Malignant neoplasm of unspecified part of right bronchus or lung: Secondary | ICD-10-CM | POA: Diagnosis not present

## 2021-04-27 DIAGNOSIS — I700 Atherosclerosis of aorta: Secondary | ICD-10-CM | POA: Diagnosis not present

## 2021-04-27 DIAGNOSIS — J90 Pleural effusion, not elsewhere classified: Secondary | ICD-10-CM | POA: Diagnosis not present

## 2021-04-27 MED ORDER — IOHEXOL 300 MG/ML  SOLN
60.0000 mL | Freq: Once | INTRAMUSCULAR | Status: AC | PRN
  Administered 2021-04-27: 60 mL via INTRAVENOUS

## 2021-04-28 ENCOUNTER — Inpatient Hospital Stay (HOSPITAL_BASED_OUTPATIENT_CLINIC_OR_DEPARTMENT_OTHER): Payer: Medicare HMO | Admitting: Internal Medicine

## 2021-04-28 ENCOUNTER — Inpatient Hospital Stay: Payer: Medicare HMO

## 2021-04-28 ENCOUNTER — Inpatient Hospital Stay (HOSPITAL_BASED_OUTPATIENT_CLINIC_OR_DEPARTMENT_OTHER): Payer: Medicare HMO | Admitting: Nurse Practitioner

## 2021-04-28 ENCOUNTER — Other Ambulatory Visit: Payer: Medicare HMO

## 2021-04-28 ENCOUNTER — Ambulatory Visit (HOSPITAL_COMMUNITY)
Admission: RE | Admit: 2021-04-28 | Discharge: 2021-04-28 | Disposition: A | Discharge status: Home / Self Care | Payer: Medicare HMO | Source: Ambulatory Visit | Attending: Radiology | Admitting: Radiology

## 2021-04-28 ENCOUNTER — Inpatient Hospital Stay: Payer: Medicare HMO | Admitting: Nutrition

## 2021-04-28 ENCOUNTER — Encounter: Payer: Medicare HMO | Admitting: Dietician

## 2021-04-28 ENCOUNTER — Telehealth: Payer: Self-pay | Admitting: Pulmonary Disease

## 2021-04-28 VITALS — BP 130/62 | HR 86 | Temp 97.1°F | Resp 19 | Ht 64.0 in | Wt 115.6 lb

## 2021-04-28 DIAGNOSIS — C3411 Malignant neoplasm of upper lobe, right bronchus or lung: Secondary | ICD-10-CM | POA: Diagnosis present

## 2021-04-28 DIAGNOSIS — C3491 Malignant neoplasm of unspecified part of right bronchus or lung: Secondary | ICD-10-CM

## 2021-04-28 DIAGNOSIS — Z923 Personal history of irradiation: Secondary | ICD-10-CM | POA: Diagnosis not present

## 2021-04-28 DIAGNOSIS — F419 Anxiety disorder, unspecified: Secondary | ICD-10-CM | POA: Diagnosis not present

## 2021-04-28 DIAGNOSIS — C773 Secondary and unspecified malignant neoplasm of axilla and upper limb lymph nodes: Secondary | ICD-10-CM | POA: Diagnosis not present

## 2021-04-28 DIAGNOSIS — Z8673 Personal history of transient ischemic attack (TIA), and cerebral infarction without residual deficits: Secondary | ICD-10-CM | POA: Diagnosis not present

## 2021-04-28 DIAGNOSIS — F32A Depression, unspecified: Secondary | ICD-10-CM | POA: Diagnosis not present

## 2021-04-28 DIAGNOSIS — Z5111 Encounter for antineoplastic chemotherapy: Secondary | ICD-10-CM | POA: Diagnosis present

## 2021-04-28 DIAGNOSIS — Z79891 Long term (current) use of opiate analgesic: Secondary | ICD-10-CM | POA: Diagnosis not present

## 2021-04-28 DIAGNOSIS — R530 Neoplastic (malignant) related fatigue: Secondary | ICD-10-CM | POA: Diagnosis not present

## 2021-04-28 DIAGNOSIS — Z7901 Long term (current) use of anticoagulants: Secondary | ICD-10-CM | POA: Diagnosis not present

## 2021-04-28 DIAGNOSIS — Z66 Do not resuscitate: Secondary | ICD-10-CM | POA: Diagnosis not present

## 2021-04-28 DIAGNOSIS — I10 Essential (primary) hypertension: Secondary | ICD-10-CM | POA: Diagnosis not present

## 2021-04-28 DIAGNOSIS — K219 Gastro-esophageal reflux disease without esophagitis: Secondary | ICD-10-CM | POA: Diagnosis not present

## 2021-04-28 DIAGNOSIS — J910 Malignant pleural effusion: Secondary | ICD-10-CM | POA: Diagnosis not present

## 2021-04-28 DIAGNOSIS — G893 Neoplasm related pain (acute) (chronic): Secondary | ICD-10-CM

## 2021-04-28 DIAGNOSIS — Z86718 Personal history of other venous thrombosis and embolism: Secondary | ICD-10-CM | POA: Diagnosis not present

## 2021-04-28 DIAGNOSIS — R531 Weakness: Secondary | ICD-10-CM | POA: Diagnosis not present

## 2021-04-28 DIAGNOSIS — D539 Nutritional anemia, unspecified: Secondary | ICD-10-CM

## 2021-04-28 DIAGNOSIS — Z515 Encounter for palliative care: Secondary | ICD-10-CM

## 2021-04-28 DIAGNOSIS — Z79899 Other long term (current) drug therapy: Secondary | ICD-10-CM | POA: Diagnosis not present

## 2021-04-28 DIAGNOSIS — R059 Cough, unspecified: Secondary | ICD-10-CM | POA: Diagnosis not present

## 2021-04-28 DIAGNOSIS — R630 Anorexia: Secondary | ICD-10-CM | POA: Diagnosis not present

## 2021-04-28 DIAGNOSIS — R69 Illness, unspecified: Secondary | ICD-10-CM | POA: Diagnosis not present

## 2021-04-28 MED ORDER — PROSOURCE PLUS PO LIQD: 30.0000 mL | Freq: Two times a day (BID) | ORAL | 0 refills | Status: AC

## 2021-04-28 MED ORDER — PSEUDOEPH-BROMPHEN-DM 30-2-10 MG/5ML PO SYRP: 5.0000 mL | ORAL_SOLUTION | Freq: Three times a day (TID) | ORAL | 0 refills | Status: DC | PRN

## 2021-04-28 NOTE — Progress Notes (Signed)
Nutrition follow-up completed with patient in exam room secondary to infusion being canceled.  Patient has been diagnosed with stage IV non-small cell lung cancer.  She is followed by Dr. Julien Nordmann.  Weight decreased and documented as 115.6 pounds on November 10 down from 122 pounds October 11.  Noted labs: Sodium 131, potassium 3.3, creatinine 1.06 on November 8.  Patient has had continued weight loss.  She is down another 5% over 4 weeks which is significant. Patient reports she has increased phlegm making it difficult to eat. Reports taste of food continues to be very poor. She is weak and has no energy. Reports she experiences diarrhea after drinking Ensure.  Normally she consumes Ensure over approximately 15 minutes.  She experiences a rush of diarrhea directly after.  She is drinking Ensure at room temperature.  Nutrition diagnosis: Unintended weight loss continues.  Intervention: Begin baking soda and salt water rinses frequently throughout the day but especially before meals and snacks. Increase overall hydration. Try Ensure in smaller amounts such as 2 ounces every 30 minutes and evaluate tolerance.  Try to work up to 2-3 cartons daily. Sip on pineapple juice (this is safe while taking Tagrisso) to help with thickened saliva.  This can be thinned down with water if it is too acidic. Use a coolmist humidifier especially in the bedroom overnight.  Monitoring, evaluation, goals: Patient will tolerate adequate calories and protein to minimize further weight loss.  Next visit: To be scheduled with upcoming treatment.  **Disclaimer: This note was dictated with voice recognition software. Similar sounding words can inadvertently be transcribed and this note may contain transcription errors which may not have been corrected upon publication of note.**

## 2021-04-28 NOTE — Progress Notes (Signed)
[place]  Telephone:([phone]) [phone] [phone]) [phone]   Name: Allison [name] Date: 04/28/2021 MRN: [id number]  DOB: 03-23-1951  Patient Care Team: [name], MD as PCP - General (Internal Medicine) Donato Heinz, MD as PCP - Cardiology (Cardiology) [name], MD as Consulting Physician (Radiation Oncology)    REASON FOR CONSULTATION: Allison [name] is a 70 y.o. female with multiple medical problems including stage IV non-small cell lung cancer (12/2017), hypertension, CVA, CAD, DVT/PE (on Lovenox), and GERD.  Palliative ask to see for symptom management and goals of care.   SOCIAL HISTORY:     reports that she has never smoked. She has never used smokeless tobacco. She reports that she does not currently use alcohol. She reports that she does not use drugs.  ADVANCE DIRECTIVES:  None on file  CODE STATUS: DNR  PAST MEDICAL HISTORY: Past Medical History:  Diagnosis Date   Anemia    Anxiety    Arthritis    Asthma    exercise induced   Depression    PMH   Dyspnea    GERD (gastroesophageal reflux disease)    Glaucoma    History of radiation therapy 01/05/2021   IMRT right lung  11/24/2020-01/05/2021  Dr [name]   Hypertension    lung ca dx'd 11/2017   right   Malignant pleural effusion    right   PONV (postoperative nausea and vomiting)    Pre-diabetes    Raynaud's disease    Raynaud's disease    Stroke ([place]) 01/2021   balance off, some express aphasia, weakness    PAST SURGICAL HISTORY:  Past Surgical History:  Procedure Laterality Date   ABDOMINAL HYSTERECTOMY     partial   BRONCHIAL BIOPSY  04/21/2021   Procedure: BRONCHIAL BIOPSIES;  Surgeon: [name], DO;  Location: [facility];  Service: Pulmonary;;   BRONCHIAL BRUSHINGS  04/21/2021   Procedure: BRONCHIAL BRUSHINGS;  Surgeon: [name], DO;  Location: [facility];  Service: Pulmonary;;   BRONCHIAL NEEDLE ASPIRATION  BIOPSY  04/21/2021   Procedure: BRONCHIAL NEEDLE ASPIRATION BIOPSIES;  Surgeon: [name], DO;  Location: [facility];  Service: Pulmonary;;   CHEST TUBE INSERTION Right 01/01/2018   Procedure: INSERTION PLEURAL DRAINAGE CATHETER;  Surgeon: Ivin Poot, MD;  Location: [facility];  Service: Thoracic;  Laterality: Right;   CHEST TUBE INSERTION  04/21/2021   Procedure: CHEST TUBE INSERTION;  Surgeon: [name], DO;  Location: [facility];  Service: Pulmonary;;   COLONOSCOPY     CORONARY STENT INTERVENTION N/A 06/16/2019   Procedure: CORONARY STENT INTERVENTION;  Surgeon: Jettie Booze, MD;  Location: [facility];  Service: Cardiovascular;  Laterality: N/A;   DILATION AND CURETTAGE OF UTERUS     EYE SURGERY     due to Glaucoma   IR IMAGING GUIDED PORT INSERTION  03/22/2021   IR PORT REPAIR CENTRAL VENOUS ACCESS DEVICE  04/15/2021   LEFT HEART CATH AND CORONARY ANGIOGRAPHY N/A 06/16/2019   Procedure: LEFT HEART CATH AND CORONARY ANGIOGRAPHY;  Surgeon: Jettie Booze, MD;  Location: [facility];  Service: Cardiovascular;  Laterality: N/A;   REMOVAL OF PLEURAL DRAINAGE CATHETER Right 11/07/2018   Procedure: REMOVAL OF PLEURAL DRAINAGE CATHETER;  Surgeon: Ivin Poot, MD;  Location: [facility];  Service: Thoracic;  Laterality: Right;   ROTATOR CUFF REPAIR     TUBAL LIGATION     VIDEO BRONCHOSCOPY WITH ENDOBRONCHIAL NAVIGATION  Bilateral 04/21/2021   Procedure: VIDEO BRONCHOSCOPY WITH ENDOBRONCHIAL NAVIGATION;  Surgeon: [name], DO;  Location: [facility];  Service: Pulmonary;  Laterality: Bilateral;  ION   WISDOM TOOTH EXTRACTION      HEMATOLOGY/ONCOLOGY HISTORY:  Oncology History Overview Note  Patient presented with cough, congestion, SOB, fatigue, and wight loss.  Work up showed metastatic disease.    Adenocarcinoma of right lung, stage 4 ([place])  12/11/2017 Imaging   CT Chest IMPRESSION: 1. Large right pleural effusion with volume loss of much  of the right lung. 2. Right upper lobe spiculated mass of 2.3 x 2.9 cm suspicious for primary lung carcinoma. 3. Several small nodules throughout the right lung and left lung suspicious for metastases. Some of the nodules on the right are intimately associated with pleura and could represent pleural metastases. 4. Questionable soft tissue nodule in the left upper abdomen which could represent a metastatic lesion. 5. Mixed lesion within the T9 vertebral body. Cannot exclude a metastatic process.   12/19/2017 Procedure   Thoracentesis   12/26/2017 Imaging   PET IMPRESSION: 1. Hypermetabolic spiculated 3.3 cm apical right upper lobe lung mass, compatible with primary bronchogenic adenocarcinoma. 2. Hypermetabolic right hilar and right paratracheal lymphadenopathy. 3. Moderate dependent right pleural effusion with mild right pleural thickening, compatible with known malignant right pleural effusion. 4. Hypermetabolic 1.6 cm anterior right lower lobe nodule associated with the major fissure, compatible with pulmonary metastasis. Numerous additional subcentimeter pulmonary nodules scattered in both lungs, below PET resolution, suspicious for pulmonary metastases. Recommend attention on follow-up chest CT in 3 months   01/04/2018 Initial Diagnosis   Adenocarcinoma of right lung, stage 4 ([facility])   01/10/2018 Imaging   MRI Brain  No evidence of intracranial metastases   01/29/2018 -  Chemotherapy   Oral biologic Tagrisso p.o. 80 mg    08/16/2020 Cancer Staging   Staging form: Lung, AJCC 8th Edition - Clinical: Stage IVA Allison [name], cN2, cM1a) - Signed by Curt Bears, MD on 08/16/2020    03/17/2021 -  Chemotherapy   Patient is on Treatment Plan : LUNG NSCLC Pemetrexed + Carboplatin q21d x 4 Cycles       ALLERGIES:  is allergic to penicillins, vicodin [hydrocodone-acetaminophen], and other.  MEDICATIONS:  Current Outpatient Medications  Medication Sig Dispense Refill    brompheniramine-pseudoephedrine-DM 30-2-10 MG/5ML syrup Take 5 mLs by mouth 3 (three) times daily as needed. 120 mL 0   Nutritional Supplements (,FEEDING SUPPLEMENT, PROSOURCE PLUS) liquid Take 30 mLs by mouth 2 (two) times daily between meals. 1800 mL 0   acetaminophen (TYLENOL) 500 MG tablet Take 1,000 mg by mouth every 6 (six) hours as needed (pain).     ALPRAZolam (XANAX) 0.25 MG tablet Take 1 tablet (0.25 mg total) by mouth 3 (three) times daily as needed for anxiety. 30 tablet 0   amLODipine (NORVASC) 5 MG tablet Take 5 mg by mouth daily.     aspirin 81 MG chewable tablet Chew 1 tablet (81 mg total) by mouth daily.     b complex vitamins capsule Take 1 capsule by mouth daily.     carvedilol (COREG) 12.5 MG tablet Take 1 tablet (12.5 mg total) by mouth 2 (two) times daily with a meal. 180 tablet 3   cetirizine (ZYRTEC) 5 MG tablet Take 5 mg by mouth daily.     chlorthalidone (HYGROTON) 25 MG tablet Take 1 tablet (25 mg total) by mouth daily. 30 tablet 6   dexamethasone (DECADRON) 1 MG tablet Take 1 tablet (  1 mg total) by mouth 2 (two) times daily as needed (For appetite stimulation). 30 tablet 1   dorzolamide-timolol (COSOPT) 22.3-6.8 MG/ML ophthalmic solution Place 1 drop into both eyes 2 (two) times daily.     enoxaparin (LOVENOX) 60 MG/0.6ML injection Inject 0.6 mLs (60 mg total) into the skin every 12 (twelve) hours. 60 mL 1   esomeprazole (NEXIUM) 20 MG capsule Take 20 mg by mouth in the morning and at bedtime.     ezetimibe (ZETIA) 10 MG tablet Take 10 mg by mouth daily.      FLUoxetine (PROZAC) 10 MG capsule Take 1 capsule (10 mg total) by mouth daily. 30 capsule 3   folic acid (FOLVITE) 1 MG tablet Take 1 tablet (1 mg total) by mouth daily. 30 tablet 4   guaiFENesin (MUCINEX) 600 MG 12 hr tablet Take 2 tablets (1,200 mg total) by mouth 2 (two) times daily. 30 tablet 10   ipratropium-albuterol (DUONEB) 0.5-2.5 (3) MG/3ML SOLN Take 3 mLs by nebulization every 6 (six) hours as needed  (Asthma).     isosorbide mononitrate (IMDUR) 30 MG 24 hr tablet Take 1 tablet (30 mg total) by mouth daily. 90 tablet 3   latanoprost (XALATAN) 0.005 % ophthalmic solution Place 1 drop into both eyes at bedtime.      lidocaine (LIDODERM) 5 % Place 1 patch onto the skin daily. Remove & Discard patch within 12 hours or as directed by MD 30 patch 0   osimertinib mesylate (TAGRISSO) 80 MG tablet Take 1 tablet (80 mg total) by mouth daily. 30 tablet 3   OVER THE COUNTER MEDICATION Take 4 drops by mouth daily. Viatmin d     oxyCODONE (OXY IR/ROXICODONE) 5 MG immediate release tablet Take 1 tablet (5 mg total) by mouth every 4 (four) hours as needed for moderate pain. 30 tablet 0   oxyCODONE (OXYCONTIN) 10 mg 12 hr tablet Take 1 tablet (10 mg total) by mouth every 12 (twelve) hours. 14 tablet 0   potassium chloride SA (KLOR-CON) 20 MEQ tablet Take 2 tablets (40 mEq total) by mouth daily for 5 doses. 10 tablet 0   prochlorperazine (COMPAZINE) 10 MG tablet Take 1 tablet (10 mg total) by mouth every 6 (six) hours as needed for nausea or vomiting. 30 tablet 0   REPATHA SURECLICK 619 MG/ML SOAJ Inject 140 mg into the skin every 14 (fourteen) days. 6 mL 3   sucralfate (CARAFATE) 1 g tablet Take 1 tablet (1 g total) by mouth 4 (four) times daily. with meals and at bedtime. Crush and dissolve in 10 mL of warm water prior to swallowing 120 tablet 0   No current facility-administered medications for this visit.    VITAL SIGNS: LMP  (LMP Unknown)  There were no vitals filed for this visit.  Estimated body mass index is 19.84 kg/m as calculated from the following:   Height as of an earlier encounter on 04/28/21: 5\' 4"  (1.626 m).   Weight as of an earlier encounter on 04/28/21: 115 lb 9.6 oz (52.4 kg).  LABS: CBC:    Component Value Date/Time   WBC 2.0 (L) 04/26/2021 0333   HGB 8.7 (L) 04/26/2021 0333   HGB 9.4 (L) 04/20/2021 1118   HGB 11.6 07/03/2019 1153   HCT 27.3 (L) 04/26/2021 0333   HCT 36.0  07/03/2019 1153   PLT 302 04/26/2021 0333   PLT 92 (L) 04/20/2021 1118   PLT 291 07/03/2019 1153   MCV 83.0 04/26/2021 0333   MCV  81 07/03/2019 1153   NEUTROABS 0.9 (L) 04/20/2021 1118   LYMPHSABS 0.4 (L) 04/20/2021 1118   MONOABS 0.3 04/20/2021 1118   EOSABS 0.0 04/20/2021 1118   BASOSABS 0.0 04/20/2021 1118   Comprehensive Metabolic Panel:    Component Value Date/Time   NA 131 (L) 04/26/2021 0333   NA 140 03/04/2021 1516   K 3.3 (L) 04/26/2021 0333   CL 96 (L) 04/26/2021 0333   CO2 25 04/26/2021 0333   BUN 11 04/26/2021 0333   BUN 21 03/04/2021 1516   CREATININE 1.06 (H) 04/26/2021 0333   CREATININE 0.94 04/20/2021 1118   GLUCOSE 97 04/26/2021 0333   CALCIUM 9.3 04/26/2021 0333   AST 37 04/20/2021 1118   ALT 52 (H) 04/20/2021 1118   ALKPHOS 76 04/20/2021 1118   BILITOT <0.2 (L) 04/20/2021 1118   PROT 7.2 04/20/2021 1118   PROT 7.1 11/01/2020 0839   ALBUMIN 2.7 (L) 04/20/2021 1118   ALBUMIN 4.6 11/01/2020 0839    RADIOGRAPHIC STUDIES: CT Chest W Contrast  Result Date: 04/27/2021 CLINICAL DATA:  70 year old female with history of non-small cell lung cancer. Status post radiation therapy and chemotherapy (radiation therapy completed in late July 2022). Evaluate for treatment response. EXAM: CT CHEST WITH CONTRAST TECHNIQUE: Multidetector CT imaging of the chest was performed during intravenous contrast administration. CONTRAST:  81mL OMNIPAQUE IOHEXOL 300 MG/ML  SOLN COMPARISON:  Chest CT 03/04/2021. FINDINGS: Cardiovascular: Heart size is normal. There is no significant pericardial fluid, thickening or pericardial calcification. There is aortic atherosclerosis, as well as atherosclerosis of the great vessels of the mediastinum and the coronary arteries, including calcified atherosclerotic plaque in the left anterior descending and right coronary arteries. Right internal jugular single-lumen porta cath with tip terminating in the right atrium. Mediastinum/Nodes: There is  amorphous soft tissue in the subcarinal region, without discrete enlarged lymph node, very similar to the prior study. Esophagus is unremarkable in appearance. No axillary lymphadenopathy. Lungs/Pleura: Trace residual left-sided pneumothorax best noted medially (axial image 48 of series 4), of doubtful clinical significance. Treated right upper lobe neoplasm near the apex currently measures 2.5 x 1.7 cm (axial image 31 of series 4), previously 2.7 x 1.7 cm on 03/04/2021. This is surrounded by extensive consolidation, with patchy areas of septal thickening, ground-glass attenuation and thickening of the peribronchovascular interstitium noted in the adjacent portions of the right upper lung, overall increased compared to the recent prior examination, likely to reflect evolving postradiation changes of resolving pneumonitis and developing fibrosis. Widespread areas of septal thickening and micronodularity seen throughout the left lung on the prior study have nearly completely regressed. Moderate right pleural effusion, partially loculated in the right major fissure, similar to the prior study. Upper Abdomen: Unremarkable. Musculoskeletal: Small amount of gas in the left chest wall, presumably related to recently removed chest tube. There are no aggressive appearing lytic or blastic lesions noted in the visualized portions of the skeleton. IMPRESSION: 1. Today's study demonstrates evolving postradiation changes in the lungs, likely with resolving postradiation pneumonitis and developing postradiation fibrosis. Treated right upper lobe lesion is stable in size. 2. Moderate right partially loculated pleural effusion, similar to the prior examination. 3. Aortic atherosclerosis, in addition to two vessel coronary artery disease. Please note that although the presence of coronary artery calcium documents the presence of coronary artery disease, the severity of this disease and any potential stenosis cannot be assessed on  this non-gated CT examination. Assessment for potential risk factor modification, dietary therapy or pharmacologic therapy may be  warranted, if clinically indicated. Aortic Atherosclerosis (ICD10-I70.0). Electronically Signed   By: Vinnie Langton M.D.   On: 04/27/2021 14:42   DG CHEST PORT 1 VIEW  Result Date: 04/26/2021 CLINICAL DATA:  Pneumothorax EXAM: PORTABLE CHEST 1 VIEW COMPARISON:  Previous studies including the examination of 04/25/2021 FINDINGS: Transverse diameter of heart is in the upper limits of normal. There is prominence of superior mediastinum with no significant change. Increased markings in the right apical region have not changed. There is homogeneous opacity in the right mid and right lower lung fields which may be due to loculated pleural effusion and possibly underlying atelectasis/pneumonia. There is crowding of markings in the medial left lower lung fields. Rest of the left lung is unremarkable. Left lateral CP angle is clear. There is decrease in size of minimal residual left pneumothorax along the lateral margin of left upper lung fields. IMPRESSION: There is decrease in size of small left pneumothorax. No significant interval changes are noted in the lung fields. Electronically Signed   By: Elmer Picker M.D.   On: 04/26/2021 08:42   DG CHEST PORT 1 VIEW  Result Date: 04/25/2021 CLINICAL DATA:  Follow-up pneumothorax EXAM: PORTABLE CHEST 1 VIEW COMPARISON:  04/25/2021, CT 04/03/2021, radiograph 04/21/2021 FINDINGS: Interim removal of left-sided chest tube. Residual small apicolateral pneumothorax without significant change since yesterday's radiograph. Right-sided central venous port tip over the SVC. Right pleural effusion and airspace opacities are unchanged. Normal cardiomediastinal silhouette. IMPRESSION: 1. Removal of left chest tube with similar size of small residual left apicolateral pneumothorax 2. Otherwise no significant change as compared with yesterday's  radiograph. Electronically Signed   By: Donavan Foil M.D.   On: 04/25/2021 17:16   DG CHEST PORT 1 VIEW  Result Date: 04/25/2021 CLINICAL DATA:  Chest tube, pneumothorax EXAM: PORTABLE CHEST 1 VIEW COMPARISON:  Chest x-ray 04/24/2021 FINDINGS: Left-sided chest tube in stable position. Stable small left pneumothorax. Cardiomediastinal silhouette is unchanged. Right-sided central venous port with the tip in the SVC. Stable prominent interstitial lung markings. Grossly stable dense opacities in the right upper and lower lung zones and pleural effusion along the peripheral right lung. IMPRESSION: No significant change since previous study. Stable small left pneumothorax. Electronically Signed   By: Ofilia Neas M.D.   On: 04/25/2021 08:09   DG CHEST PORT 1 VIEW  Result Date: 04/24/2021 CLINICAL DATA:  Pleuritic pain.  Left chest pain. EXAM: PORTABLE CHEST 1 VIEW COMPARISON:  April 24, 2021 FINDINGS: The left chest tube is stable. The previously identified left pneumothorax is much smaller in the interval with only a tiny pneumothorax remaining. Cardiomediastinal silhouette is stable. The right Port-A-Cath is stable. Stable right pulmonary infiltrates. IMPRESSION: 1. Stable right pulmonary infiltrates. 2. Stable left chest tube. A tiny left pneumothorax remains, smaller in the interval. Electronically Signed   By: Dorise Bullion III M.D.   On: 04/24/2021 18:25   DG CHEST PORT 1 VIEW  Result Date: 04/24/2021 CLINICAL DATA:  Shortness of breath. EXAM: PORTABLE CHEST 1 VIEW COMPARISON:  April 23, 2021 FINDINGS: Left-sided chest tube in stable position. There has been interval increase in the left-sided pneumothorax which now occupies approximately 15% of the left hemithorax. Stable right-sided Port-A-Cath. Stable confluent opacities throughout the right lung and right pleural effusion. The cardiomediastinal silhouette is normal. IMPRESSION: Interval increase in left-sided pneumothorax which now  occupies approximately 15% of the left hemithorax. Stable position of the left chest tube. Stable appearance of the right-sided pulmonary opacities and right pleural effusion. Electronically  Signed   By: Fidela Salisbury M.D.   On: 04/24/2021 09:50   DG CHEST PORT 1 VIEW  Result Date: 04/23/2021 CLINICAL DATA:  Pneumothorax on the left EXAM: PORTABLE CHEST 1 VIEW COMPARISON:  04/22/2021 FINDINGS: Pigtail catheter on the left unchanged. Small left apical pneumothorax appears slightly larger. Left lung clear Right upper lobe and right lower lobe density is unchanged. There is associated pleural thickening or fluid on the right. Port-A-Cath tip in the SVC. IMPRESSION: Slight increase in small left apical pneumothorax Right upper lobe and right lower lobe lung densities unchanged. Electronically Signed   By: Franchot Gallo M.D.   On: 04/23/2021 11:03   DG Chest Port 1 View  Result Date: 04/22/2021 CLINICAL DATA:  Chest tube EXAM: PORTABLE CHEST 1 VIEW COMPARISON:  Chest x-ray 04/21/2021 FINDINGS: Left-sided chest tube in stable position. Right-sided central venous port stable with the tip in the SVC. Heart size is normal. Mediastinum appears similar to previous. Persistent dense opacities in the right upper and lower lung zones. Faint granular like opacities throughout the left lung most prominent in the lower lung zone. No left pleural effusion. No pneumothorax visualized. IMPRESSION: Mildly improved aeration of the left lung since previous study. Otherwise no significant change. Electronically Signed   By: Ofilia Neas M.D.   On: 04/22/2021 08:36   DG CHEST PORT 1 VIEW  Result Date: 04/21/2021 CLINICAL DATA:  Reason for exam: Chest tube in place Hx of asthma, HTN, stroke EXAM: PORTABLE CHEST - 1 VIEW COMPARISON:  Earlier film of the same day FINDINGS: Interval pigtail catheter placement to the lateral left hemithorax with near complete evacuation of the previously noted pneumothorax and improved  aeration of the left lung. Extensive airspace opacities in the right lung as before with pleural effusion or thickening. Heart size and mediastinal contours are within normal limits. Right IJ PowerPort to the cavoatrial junction. The patient has been extubated. Visualized bones unremarkable. IMPRESSION: 1. Left chest tube placement with evacuation of pneumothorax. 2. Persistent right lung opacities and pleural effusion. Electronically Signed   By: Lucrezia Europe M.D.   On: 04/21/2021 15:20   DG CHEST PORT 1 VIEW  Result Date: 04/21/2021 CLINICAL DATA:  Post bronchoscopy with biopsy EXAM: PORTABLE CHEST 1 VIEW COMPARISON:  Portable exam 1048 hours compared to 02/13/2021 FINDINGS: Tip of endotracheal tube projects 4.1 cm above carina. RIGHT jugular Port-A-Cath with tip projecting over SVC. Coronary stent noted. Normal heart size and pulmonary vascularity. Persistent airspace opacities RIGHT upper lobe and RIGHT base. Large LEFT pneumothorax with significant collapse of LEFT lung and mild mediastinal shift LEFT to RIGHT suggesting a mild degree of tension. No acute osseous findings. IMPRESSION: Large LEFT pneumothorax with slight mediastinal shift LEFT to RIGHT suggesting a mild degree of tension. Significant collapse of LEFT lung. Persistent RIGHT lung airspace opacities. Critical Value/emergent results were called by telephone at the time of interpretation on 04/21/2021 at 11:00 am to endoscopy suite with BRADLEY ICARD DO, who verbally acknowledged these results. They indicated patient already has had chest tube placed. Electronically Signed   By: Lavonia Dana M.D.   On: 04/21/2021 11:02   IR Port Repair Central Venous Access Device  Result Date: 04/15/2021 INDICATION: 70 year old female with history of right lung adenocarcinoma status post right IJ single-lumen port placement on 03/22/2021. Since placement, the patient reports small volume hemorrhage from the incision site which was treated with silver nitrate  repair approximally 1 week after placement. There has been persistent oozing from  the incision which is partially separated. EXAM: Port incision revision MEDICATIONS: None. CONTRAST:  None FLUOROSCOPY TIME:  None. COMPLICATIONS: None immediate. TECHNIQUE: The procedure, risks, benefits, and alternatives were explained to the patient and informed written consent was obtained. A timeout was performed prior to the initiation of the procedure. The right upper chest was prepped and draped in standard fashion. Elliptical incision was created around the mal-apposed port incision to excise the nonhealing incision. Examination of the port pocket was performed. Copious irrigation of the port pocket was performed. The incision was then closed with a total of 5 deep dermal interrupted 3-0 Vicryl sutures, a running subcuticular 4-0 Monocryl suture, and 2 additional 2-0 ethilon horizontal mattress buttressing sutures. Dermabond was applied. Sterile bandage was applied. FINDINGS: Mal-apposed medial aspect of the incision, likely site of prior silver nitrate application which thwarted incisional healing. No evidence of purulence or erythema about the port site. Excision of prior incision with primary repair. IMPRESSION: Technically successful excision of prior nonhealing port incision. PLAN: Return in 2 weeks for wound check and buttressing suture removal. Ruthann Cancer, MD [facility] Radiology Electronically Signed   By: Ruthann Cancer M.D.   On: 04/15/2021 15:57   DG C-ARM BRONCHOSCOPY  Result Date: 04/21/2021 C-ARM BRONCHOSCOPY: Fluoroscopy was utilized by the requesting physician.  No radiographic interpretation.    PERFORMANCE STATUS (ECOG) : 2 - Symptomatic, <50% confined to bed  Review of Systems  Constitutional:  Positive for appetite change.  Respiratory:  Positive for cough.   Musculoskeletal:  Positive for arthralgias.  Neurological:  Positive for weakness.   Unless otherwise noted, a complete review of systems is negative.  Physical Exam General: NAD, in wheelchair Cardiovascular: regular rate and rhythm Pulmonary: Normal breathing pattern, cough Abdomen: soft, nontender, + bowel sounds Neurological: Weakness, AAOx3  IMPRESSION:  Ms. Igo presents to the clinic today with her significant other, [name]. Her daughter, [name] is on speaker phone also. She is in a wheelchair. Patient was seen by my Palliative colleague during recent hospitalization with a request for ongoing outpatient support. She is also followed by Dr. [name] [facility]).   I introduced myself to patient and her family and my role in collaboration with her Oncology team here at the cancer center. Patient verbalized understanding and appreciation.   [place] in the [place] native.  She has 3 children and 5 grandchildren.  She is active in the cancer community and attends various support groups.  Patient states she has recently began using a cane when ambulating in the home due to ongoing weakness and gait instability.  Denies any recent falls.  She is able to perform most ADLs independently with some assistance occasionally.  Complains of occasional chest wall pain.  Denies nausea.  Does endorse decreased appetite and weight loss.  States she spends most of her time napping or in the home.  We discussed at length patient's current pain regimen.  She reports she is currently taking Tylenol and oxycodone as needed.  She does feel her pain is much better compared to several weeks prior.  Unfortunately she has been unable to obtain her OxyContin due to insurance authorization.  Patient and family aware I will reach out to her local pharmacy and assist with insurance authorization.  Patient is currently taking MiraLAX daily for bowel regimen.  No significant concerns with constipation at this time.  Denies nausea.  Patient shares she has minimal appetite as foods is not as  appealing as they  once were.  She tries to eat as much as possible to maintain nutrition.  States her continuous cough also interferes with her appetite.  She was initially trying to incorporate Ensure in her diet unfortunately she has been unable to tolerate due to her lactose intolerance despite taking medication.  Education provided on small frequent meals, high-protein foods, juice base protein source, and adding protein powder/supplements to her food/smoothies.  Patient and family verbalized understanding and appreciation.  Patient states her cough is intermittent throughout the day and worse throughout the night.  She has been taking Robitussin in addition to Mucinex for her congestion.  She states sometimes she has difficulty coughing up her phlegm but does get it up eventually.  We discussed continuing Mucinex as recommended in addition to using a home humidifier.  Discussed use of prescription strength cough suppressant.  Patient and daughter verbalized understanding and appreciation.  I empathetically approached the topic of patient's feelings in regards to her cancer diagnosis and long-term prognosis in addition to any continued anxiety.  Patient emotionally verbalized that her anxiety is her biggest hindrance as she often thinks about her cancer.  She continues to be a part of local support groups which offers her additional support outside of her family.  She is currently taking Xanax which she feels is minimally effective but does help.  We discussed increasing bedtime dose with hopes of some improvement throughout the day.  I discussed the importance of continued conversation with family and their medical providers regarding overall plan of care and treatment options, ensuring decisions are within the context of the patients values and GOCs.  PLAN: Continue oxycodone as needed for breakthrough pain.  Patient has been unable to obtain OxyContin from her local pharmacy due to authorization  concerns.  I will reach out to her pharmacy and assist with this process with. MiraLAX daily for bowel regimen Xanax as needed for anxiety.  Patient aware she may take 0.5 mg at bedtime. Mucinex twice daily for congestion. Brom-pseudo DM cough syrup 3 times daily as needed for cough Patient has upcoming follow-up with dietitian.  We did discuss at length focusing on increased protein intake.  Education provided on small frequent meals, high-protein foods, protein shakes, and Prosource. I will send order for cane and grab bars for use in the home. I will plan to see patient back in 1-3 weeks in collaboration with future oncology appointments.   Patient expressed understanding and was in agreement with this plan. She also understands that She can call the clinic at any time with any questions, concerns, or complaints.     Time Total: 45 min.   Visit consisted of counseling and education dealing with the complex and emotionally intense issues of symptom management and palliative care in the setting of serious and potentially life-threatening illness.Greater than 50%  of this time was spent counseling and coordinating care related to the above assessment and plan.  Signed by: Alda Lea, AGPCNP-BC Palliative Medicine Team

## 2021-04-28 NOTE — Progress Notes (Signed)
Allison [name] presents 2 weeks after port incision revision.  The incision remains well approximated, covered in dermabond.  Horizontal mattress sutures were removed without complication.  No surrounding erythema or history of discharge.  OK to use port as needed.  Encouraged continued excellent wound care.  Follow up with IR as needed.  Ruthann Cancer, MD Pager: [phone]

## 2021-04-28 NOTE — Patient Instructions (Signed)
Ms. Purdum thank you so much for allowing me to be a part of your Oncology treatment team.   To recap our discussions today:  I have placed orders for cane/grab bar I have sent a prescription to your pharmacy for cough syrup and Prosource (if insurance will cover).  Continue with your current pain medications as recommended (Oxycodone and Oxycontin-(long-acting). I will reach out to your pharmacy and provide needed authorization to allow for approval for Oxycontin.  Please use your humidifier in the home to assist with cough and congestion.  Continue with Mucinex twice daily  Encourage increase in protein, small frequent meals, protein shakes/drinks, peanut butter, nuts etc.  Continue with Xanax as needed for anxiety. You may take 2 tablets as needed at bedtime.  Continue with your support groups for additional support. Maybe speak to someone in your groups for a virtual option to allow you a temporary option until you are able to return.   Please do not hesitate to contact me via My Chart or by phone with any questions, needs, or concerns.   Jarrett Soho, RN is the Palliative Nurse and is also available to assist on my behalf. Her direct contact is [phone].   Look forward to closely working with you and your family to assist in anyway to improve and support your desired quality of life. Lexine Baton, NP

## 2021-04-28 NOTE — Progress Notes (Signed)
[place] [phone]   [phone]  OFFICE PROGRESS NOTE  [name], [place] [address]  DIAGNOSIS: Stage IV (T2 a,N2, M1a) non-small cell lung [name], adenocarcinoma diagnosed in July 2019 and presented with right upper lobe lung mass in addition to mediastinal lymphadenopathy as well as bilateral pulmonary nodules and malignant right pleural effusion.  Biomarker Findings Microsatellite status - Cannot Be Determined Tumor Mutational Burden - Cannot Be Determined Genomic Findings For a complete list of the genes assayed, please refer to the Appendix. EGFR exon 19 deletion ([id number]) TP53 Y220C 7 Disease relevant genes with no reportable alterations: KRAS, ALK, BRAF, MET, RET, ERBB2, ROS1   PRIOR THERAPY:  1) Status post right Pleurx catheter placement by Dr. Prescott Gum for drainage of malignant right pleural effusion. 2) palliative radiotherapy to the enlarging right upper lobe lung mass and mediastinum under the care of Dr. Sondra Come expected to be completed on January 05, 2021. 3) Tagrisso 80 mg p.o. daily.  First dose was given on 01/29/2018.  Status post 37 months of treatment.  CURRENT THERAPY: Systemic chemotherapy with carboplatin for AUC of 5 and Alimta 500 Mg/M2 every 3 weeks.  First dose 03/17/2021.  The patient will also continue her current treatment with Tagrisso 80 mg p.o. daily.  INTERVAL HISTORY: Allison [name] 70 y.o. female returns to the clinic today for follow-up visit accompanied by her boyfriend and her daughter [name] was available by phone during the visit.  The patient was recently discharged from the hospital after being admitted with left pneumothorax following bronchoscopy for tissue diagnosis of the progressive disease in her lung.  She was also found to have deep venous thrombosis and she was already on Eliquis for her history of stroke.  Because of the failure of Eliquis this  was discontinued and the patient was started on treatment with Lovenox.  She is currently on Lovenox twice daily and she is concerned about the frequency of the injection.  She denied having any current chest pain but continues to have shortness of breath with exertion with mild cough and no hemoptysis.  She also continues to complain of increasing fatigue and weakness.  She does not feel good enough to proceed with the chemotherapy as planned today.  She had CT scan of the chest performed yesterday and she is here for evaluation and discussion of her scan results.   MEDICAL HISTORY: Past Medical History:  Diagnosis Date   Anemia    Anxiety    Arthritis    Asthma    exercise induced   Depression    PMH   Dyspnea    GERD (gastroesophageal reflux disease)    Glaucoma    History of radiation therapy 01/05/2021   IMRT right lung  11/24/2020-01/05/2021  Dr [name]   Hypertension    lung ca dx'd 11/2017   right   Malignant pleural effusion    right   PONV (postoperative nausea and vomiting)    Pre-diabetes    Raynaud's disease    Raynaud's disease    Stroke ([facility]) 01/2021   balance off, some express aphasia, weakness    ALLERGIES:  is allergic to penicillins, vicodin [hydrocodone-acetaminophen], and other.  MEDICATIONS:  Current Outpatient Medications  Medication Sig Dispense Refill   acetaminophen (TYLENOL) 500 MG tablet Take 1,000 mg by mouth every 6 (six) hours as needed (pain).     ALPRAZolam (XANAX) 0.25 MG tablet Take 1  tablet (0.25 mg total) by mouth 3 (three) times daily as needed for anxiety. 30 tablet 0   amLODipine (NORVASC) 5 MG tablet Take 5 mg by mouth daily.     aspirin 81 MG chewable tablet Chew 1 tablet (81 mg total) by mouth daily.     b complex vitamins capsule Take 1 capsule by mouth daily.     carvedilol (COREG) 12.5 MG tablet Take 1 tablet (12.5 mg total) by mouth 2 (two) times daily with a meal. 180 tablet 3   cetirizine (ZYRTEC) 5 MG tablet Take 5 mg by  mouth daily.     chlorthalidone (HYGROTON) 25 MG tablet Take 1 tablet (25 mg total) by mouth daily. 30 tablet 6   dexamethasone (DECADRON) 1 MG tablet Take 1 tablet (1 mg total) by mouth 2 (two) times daily as needed (For appetite stimulation). 30 tablet 1   dorzolamide-timolol (COSOPT) 22.3-6.8 MG/ML ophthalmic solution Place 1 drop into both eyes 2 (two) times daily.     enoxaparin (LOVENOX) 60 MG/0.6ML injection Inject 0.6 mLs (60 mg total) into the skin every 12 (twelve) hours. 60 mL 1   esomeprazole (NEXIUM) 20 MG capsule Take 20 mg by mouth in the morning and at bedtime.     ezetimibe (ZETIA) 10 MG tablet Take 10 mg by mouth daily.      FLUoxetine (PROZAC) 10 MG capsule Take 1 capsule (10 mg total) by mouth daily. 30 capsule 3   folic acid (FOLVITE) 1 MG tablet Take 1 tablet (1 mg total) by mouth daily. 30 tablet 4   guaiFENesin (MUCINEX) 600 MG 12 hr tablet Take 2 tablets (1,200 mg total) by mouth 2 (two) times daily. 30 tablet 10   ipratropium-albuterol (DUONEB) 0.5-2.5 (3) MG/3ML SOLN Take 3 mLs by nebulization every 6 (six) hours as needed (Asthma).     isosorbide mononitrate (IMDUR) 30 MG 24 hr tablet Take 1 tablet (30 mg total) by mouth daily. 90 tablet 3   latanoprost (XALATAN) 0.005 % ophthalmic solution Place 1 drop into both eyes at bedtime.      lidocaine (LIDODERM) 5 % Place 1 patch onto the skin daily. Remove & Discard patch within 12 hours or as directed by MD 30 patch 0   osimertinib mesylate (TAGRISSO) 80 MG tablet Take 1 tablet (80 mg total) by mouth daily. 30 tablet 3   OVER THE COUNTER MEDICATION Take 4 drops by mouth daily. Viatmin d     oxyCODONE (OXY IR/ROXICODONE) 5 MG immediate release tablet Take 1 tablet (5 mg total) by mouth every 4 (four) hours as needed for moderate pain. 30 tablet 0   oxyCODONE (OXYCONTIN) 10 mg 12 hr tablet Take 1 tablet (10 mg total) by mouth every 12 (twelve) hours. 14 tablet 0   potassium chloride SA (KLOR-CON) 20 MEQ tablet Take 2 tablets  (40 mEq total) by mouth daily for 5 doses. 10 tablet 0   prochlorperazine (COMPAZINE) 10 MG tablet Take 1 tablet (10 mg total) by mouth every 6 (six) hours as needed for nausea or vomiting. 30 tablet 0   REPATHA SURECLICK 742 MG/ML SOAJ Inject 140 mg into the skin every 14 (fourteen) days. 6 mL 3   sucralfate (CARAFATE) 1 g tablet Take 1 tablet (1 g total) by mouth 4 (four) times daily. with meals and at bedtime. Crush and dissolve in 10 mL of warm water prior to swallowing 120 tablet 0   No current facility-administered medications for this visit.    SURGICAL HISTORY:  Past Surgical History:  Procedure Laterality Date   ABDOMINAL HYSTERECTOMY     partial   BRONCHIAL BIOPSY  04/21/2021   Procedure: BRONCHIAL BIOPSIES;  Surgeon: [name], DO;  Location: [facility];  Service: Pulmonary;;   BRONCHIAL BRUSHINGS  04/21/2021   Procedure: BRONCHIAL BRUSHINGS;  Surgeon: [name], DO;  Location: [facility];  Service: Pulmonary;;   BRONCHIAL NEEDLE ASPIRATION BIOPSY  04/21/2021   Procedure: BRONCHIAL NEEDLE ASPIRATION BIOPSIES;  Surgeon: [name], DO;  Location: [facility];  Service: Pulmonary;;   CHEST TUBE INSERTION Right 01/01/2018   Procedure: INSERTION PLEURAL DRAINAGE CATHETER;  Surgeon: Ivin Poot, MD;  Location[facility];  Service: Thoracic;  Laterality: Right;   CHEST TUBE INSERTION  04/21/2021   Procedure: CHEST TUBE INSERTION;  Surgeon: [name], DO;  Location: [facility];  Service: Pulmonary;;   COLONOSCOPY     CORONARY STENT INTERVENTION N/A 06/16/2019   Procedure: CORONARY STENT INTERVENTION;  Surgeon: Jettie Booze, MD;  Location: [facility];  Service: Cardiovascular;  Laterality: N/A;   DILATION AND CURETTAGE OF UTERUS     EYE SURGERY     due to Glaucoma   IR IMAGING GUIDED PORT INSERTION  03/22/2021   IR PORT REPAIR CENTRAL VENOUS ACCESS DEVICE  04/15/2021   LEFT HEART CATH AND CORONARY ANGIOGRAPHY N/A 06/16/2019   Procedure:  LEFT HEART CATH AND CORONARY ANGIOGRAPHY;  Surgeon: Jettie Booze, MD;  Location: [facility];  Service: Cardiovascular;  Laterality: N/A;   REMOVAL OF PLEURAL DRAINAGE CATHETER Right 11/07/2018   Procedure: REMOVAL OF PLEURAL DRAINAGE CATHETER;  Surgeon: Ivin Poot, MD;  Location: [facility];  Service: Thoracic;  Laterality: Right;   ROTATOR CUFF REPAIR     TUBAL LIGATION     VIDEO BRONCHOSCOPY WITH ENDOBRONCHIAL NAVIGATION Bilateral 04/21/2021   Procedure: VIDEO BRONCHOSCOPY WITH ENDOBRONCHIAL NAVIGATION;  Surgeon: [name], DO;  Location: [facility];  Service: Pulmonary;  Laterality: Bilateral;  ION   WISDOM TOOTH EXTRACTION      REVIEW OF SYSTEMS:  Constitutional: positive for anorexia and fatigue Eyes: negative Ears, nose, mouth, throat, and face: negative Respiratory: positive for cough and dyspnea on exertion Cardiovascular: negative Gastrointestinal: negative Genitourinary:negative Integument/breast: negative Hematologic/lymphatic: negative Musculoskeletal:positive for muscle weakness Neurological: negative Behavioral/Psych: negative Endocrine: negative Allergic/Immunologic: negative   PHYSICAL EXAMINATION: General appearance: alert, cooperative, fatigued, and no distress Head: Normocephalic, without obvious abnormality, atraumatic Neck: no adenopathy, no JVD, supple, symmetrical, trachea midline, and thyroid not enlarged, symmetric, no tenderness/mass/nodules Lymph nodes: Cervical, supraclavicular, and axillary nodes normal. Resp: rales RUL Back: symmetric, no curvature. ROM normal. No CVA tenderness. Cardio: regular rate and rhythm, S1, S2 normal, no murmur, click, rub or gallop GI: soft, non-tender; bowel sounds normal; no masses,  no organomegaly Extremities: extremities normal, atraumatic, no cyanosis or edema Neurologic: Alert and oriented X 3, normal strength and tone. Normal symmetric reflexes. Normal coordination and gait  ECOG PERFORMANCE  STATUS: 1 - Symptomatic but completely ambulatory  There were no vitals taken for this visit.  LABORATORY DATA: Lab Results  Component Value Date   WBC 2.0 (L) 04/26/2021   HGB 8.7 (L) 04/26/2021   HCT 27.3 (L) 04/26/2021   MCV 83.0 04/26/2021   PLT 302 04/26/2021      Chemistry      Component Value Date/Time   NA 131 (L) 04/26/2021 0333   NA 140 03/04/2021 1516   K 3.3 (L) 04/26/2021 0333   CL 96 (  L) 04/26/2021 0333   CO2 25 04/26/2021 0333   BUN 11 04/26/2021 0333   BUN 21 03/04/2021 1516   CREATININE 1.06 (H) 04/26/2021 0333   CREATININE 0.94 04/20/2021 1118      Component Value Date/Time   CALCIUM 9.3 04/26/2021 0333   ALKPHOS 76 04/20/2021 1118   AST 37 04/20/2021 1118   ALT 52 (H) 04/20/2021 1118   BILITOT <0.2 (L) 04/20/2021 1118       RADIOGRAPHIC STUDIES: CT Chest W Contrast  Result Date: 04/27/2021 CLINICAL DATA:  70 year old female with history of non-small cell lung [name]. Status post radiation therapy and chemotherapy (radiation therapy completed in late July 2022). Evaluate for treatment response. EXAM: CT CHEST WITH CONTRAST TECHNIQUE: Multidetector CT imaging of the chest was performed during intravenous contrast administration. CONTRAST:  7m OMNIPAQUE IOHEXOL 300 MG/ML  SOLN COMPARISON:  Chest CT 03/04/2021. FINDINGS: Cardiovascular: Heart size is normal. There is no significant pericardial fluid, thickening or pericardial calcification. There is aortic atherosclerosis, as well as atherosclerosis of the great vessels of the mediastinum and the coronary arteries, including calcified atherosclerotic plaque in the left anterior descending and right coronary arteries. Right internal jugular single-lumen porta cath with tip terminating in the right atrium. Mediastinum/Nodes: There is amorphous soft tissue in the subcarinal region, without discrete enlarged lymph node, very similar to the prior study. Esophagus is unremarkable in appearance. No axillary  lymphadenopathy. Lungs/Pleura: Trace residual left-sided pneumothorax best noted medially (axial image 48 of series 4), of doubtful clinical significance. Treated right upper lobe neoplasm near the apex currently measures 2.5 x 1.7 cm (axial image 31 of series 4), previously 2.7 x 1.7 cm on 03/04/2021. This is surrounded by extensive consolidation, with patchy areas of septal thickening, ground-glass attenuation and thickening of the peribronchovascular interstitium noted in the adjacent portions of the right upper lung, overall increased compared to the recent prior examination, likely to reflect evolving postradiation changes of resolving pneumonitis and developing fibrosis. Widespread areas of septal thickening and micronodularity seen throughout the left lung on the prior study have nearly completely regressed. Moderate right pleural effusion, partially loculated in the right major fissure, similar to the prior study. Upper Abdomen: Unremarkable. Musculoskeletal: Small amount of gas in the left chest wall, presumably related to recently removed chest tube. There are no aggressive appearing lytic or blastic lesions noted in the visualized portions of the skeleton. IMPRESSION: 1. Today's study demonstrates evolving postradiation changes in the lungs, likely with resolving postradiation pneumonitis and developing postradiation fibrosis. Treated right upper lobe lesion is stable in size. 2. Moderate right partially loculated pleural effusion, similar to the prior examination. 3. Aortic atherosclerosis, in addition to two vessel coronary artery disease. Please note that although the presence of coronary artery calcium documents the presence of coronary artery disease, the severity of this disease and any potential stenosis cannot be assessed on this non-gated CT examination. Assessment for potential risk factor modification, dietary therapy or pharmacologic therapy may be warranted, if clinically indicated. Aortic  Atherosclerosis (ICD10-I70.0). Electronically Signed   By: DVinnie LangtonM.D.   On: 04/27/2021 14:42   DG CHEST PORT 1 VIEW  Result Date: 04/26/2021 CLINICAL DATA:  Pneumothorax EXAM: PORTABLE CHEST 1 VIEW COMPARISON:  Previous studies including the examination of 04/25/2021 FINDINGS: Transverse diameter of heart is in the upper limits of normal. There is prominence of superior mediastinum with no significant change. Increased markings in the right apical region have not changed. There is homogeneous opacity in the right mid  and right lower lung fields which may be due to loculated pleural effusion and possibly underlying atelectasis/pneumonia. There is crowding of markings in the medial left lower lung fields. Rest of the left lung is unremarkable. Left lateral CP angle is clear. There is decrease in size of minimal residual left pneumothorax along the lateral margin of left upper lung fields. IMPRESSION: There is decrease in size of small left pneumothorax. No significant interval changes are noted in the lung fields. Electronically Signed   By: Elmer Picker M.D.   On: 04/26/2021 08:42   DG CHEST PORT 1 VIEW  Result Date: 04/25/2021 CLINICAL DATA:  Follow-up pneumothorax EXAM: PORTABLE CHEST 1 VIEW COMPARISON:  04/25/2021, CT 04/03/2021, radiograph 04/21/2021 FINDINGS: Interim removal of left-sided chest tube. Residual small apicolateral pneumothorax without significant change since yesterday's radiograph. Right-sided central venous port tip over the SVC. Right pleural effusion and airspace opacities are unchanged. Normal cardiomediastinal silhouette. IMPRESSION: 1. Removal of left chest tube with similar size of small residual left apicolateral pneumothorax 2. Otherwise no significant change as compared with yesterday's radiograph. Electronically Signed   By: Donavan Foil M.D.   On: 04/25/2021 17:16   DG CHEST PORT 1 VIEW  Result Date: 04/25/2021 CLINICAL DATA:  Chest tube, pneumothorax  EXAM: PORTABLE CHEST 1 VIEW COMPARISON:  Chest x-ray 04/24/2021 FINDINGS: Left-sided chest tube in stable position. Stable small left pneumothorax. Cardiomediastinal silhouette is unchanged. Right-sided central venous port with the tip in the SVC. Stable prominent interstitial lung markings. Grossly stable dense opacities in the right upper and lower lung zones and pleural effusion along the peripheral right lung. IMPRESSION: No significant change since previous study. Stable small left pneumothorax. Electronically Signed   By: Ofilia Neas M.D.   On: 04/25/2021 08:09   DG CHEST PORT 1 VIEW  Result Date: 04/24/2021 CLINICAL DATA:  Pleuritic pain.  Left chest pain. EXAM: PORTABLE CHEST 1 VIEW COMPARISON:  April 24, 2021 FINDINGS: The left chest tube is stable. The previously identified left pneumothorax is much smaller in the interval with only a tiny pneumothorax remaining. Cardiomediastinal silhouette is stable. The right Port-A-Cath is stable. Stable right pulmonary infiltrates. IMPRESSION: 1. Stable right pulmonary infiltrates. 2. Stable left chest tube. A tiny left pneumothorax remains, smaller in the interval. Electronically Signed   By: Dorise Bullion III M.D.   On: 04/24/2021 18:25   DG CHEST PORT 1 VIEW  Result Date: 04/24/2021 CLINICAL DATA:  Shortness of breath. EXAM: PORTABLE CHEST 1 VIEW COMPARISON:  April 23, 2021 FINDINGS: Left-sided chest tube in stable position. There has been interval increase in the left-sided pneumothorax which now occupies approximately 15% of the left hemithorax. Stable right-sided Port-A-Cath. Stable confluent opacities throughout the right lung and right pleural effusion. The cardiomediastinal silhouette is normal. IMPRESSION: Interval increase in left-sided pneumothorax which now occupies approximately 15% of the left hemithorax. Stable position of the left chest tube. Stable appearance of the right-sided pulmonary opacities and right pleural effusion.  Electronically Signed   By: Fidela Salisbury M.D.   On: 04/24/2021 09:50   DG CHEST PORT 1 VIEW  Result Date: 04/23/2021 CLINICAL DATA:  Pneumothorax on the left EXAM: PORTABLE CHEST 1 VIEW COMPARISON:  04/22/2021 FINDINGS: Pigtail catheter on the left unchanged. Small left apical pneumothorax appears slightly larger. Left lung clear Right upper lobe and right lower lobe density is unchanged. There is associated pleural thickening or fluid on the right. Port-A-Cath tip in the SVC. IMPRESSION: Slight increase in small left apical pneumothorax Right  upper lobe and right lower lobe lung densities unchanged. Electronically Signed   By: Franchot Gallo M.D.   On: 04/23/2021 11:03   DG Chest Port 1 View  Result Date: 04/22/2021 CLINICAL DATA:  Chest tube EXAM: PORTABLE CHEST 1 VIEW COMPARISON:  Chest x-ray 04/21/2021 FINDINGS: Left-sided chest tube in stable position. Right-sided central venous port stable with the tip in the SVC. Heart size is normal. Mediastinum appears similar to previous. Persistent dense opacities in the right upper and lower lung zones. Faint granular like opacities throughout the left lung most prominent in the lower lung zone. No left pleural effusion. No pneumothorax visualized. IMPRESSION: Mildly improved aeration of the left lung since previous study. Otherwise no significant change. Electronically Signed   By: Ofilia Neas M.D.   On: 04/22/2021 08:36   DG CHEST PORT 1 VIEW  Result Date: 04/21/2021 CLINICAL DATA:  Reason for exam: Chest tube in place Hx of asthma, HTN, stroke EXAM: PORTABLE CHEST - 1 VIEW COMPARISON:  Earlier film of the same day FINDINGS: Interval pigtail catheter placement to the lateral left hemithorax with near complete evacuation of the previously noted pneumothorax and improved aeration of the left lung. Extensive airspace opacities in the right lung as before with pleural effusion or thickening. Heart size and mediastinal contours are within normal  limits. Right IJ PowerPort to the cavoatrial junction. The patient has been extubated. Visualized bones unremarkable. IMPRESSION: 1. Left chest tube placement with evacuation of pneumothorax. 2. Persistent right lung opacities and pleural effusion. Electronically Signed   By: Lucrezia Europe M.D.   On: 04/21/2021 15:20   DG CHEST PORT 1 VIEW  Result Date: 04/21/2021 CLINICAL DATA:  Post bronchoscopy with biopsy EXAM: PORTABLE CHEST 1 VIEW COMPARISON:  Portable exam 1048 hours compared to 02/13/2021 FINDINGS: Tip of endotracheal tube projects 4.1 cm above carina. RIGHT jugular Port-A-Cath with tip projecting over SVC. Coronary stent noted. Normal heart size and pulmonary vascularity. Persistent airspace opacities RIGHT upper lobe and RIGHT base. Large LEFT pneumothorax with significant collapse of LEFT lung and mild mediastinal shift LEFT to RIGHT suggesting a mild degree of tension. No acute osseous findings. IMPRESSION: Large LEFT pneumothorax with slight mediastinal shift LEFT to RIGHT suggesting a mild degree of tension. Significant collapse of LEFT lung. Persistent RIGHT lung airspace opacities. Critical Value/emergent results were called by telephone at the time of interpretation on 04/21/2021 at 11:00 am to endoscopy suite with BRADLEY ICARD DO, who verbally acknowledged these results. They indicated patient already has had chest tube placed. Electronically Signed   By: Lavonia Dana M.D.   On: 04/21/2021 11:02   MYOCARDIAL PERFUSION IMAGING  Result Date: 03/29/2021   The study is normal. The study is low risk.   No ST deviation was noted.   Left ventricular function is normal. Nuclear stress EF: 68 %. The left ventricular ejection fraction is hyperdynamic (>65%). End diastolic cavity size is normal.   Prior study available for comparison from 03/19/2020. Normal stress nuclear study with no ischemia or infarction; gated EF 68 with normal wall motion.   IR Port Repair Central Venous Access Device  Result  Date: 04/15/2021 INDICATION: 70 year old female with history of right lung adenocarcinoma status post right IJ single-lumen port placement on 03/22/2021. Since placement, the patient reports small volume hemorrhage from the incision site which was treated with silver nitrate repair approximally 1 week after placement. There has been persistent oozing from the incision which is partially separated. EXAM: Port incision revision MEDICATIONS: None.  CONTRAST:  None FLUOROSCOPY TIME:  None. COMPLICATIONS: None immediate. TECHNIQUE: The procedure, risks, benefits, and alternatives were explained to the patient and informed written consent was obtained. A timeout was performed prior to the initiation of the procedure. The right upper chest was prepped and draped in standard fashion. Elliptical incision was created around the mal-apposed port incision to excise the nonhealing incision. Examination of the port pocket was performed. Copious irrigation of the port pocket was performed. The incision was then closed with a total of 5 deep dermal interrupted 3-0 Vicryl sutures, a running subcuticular 4-0 Monocryl suture, and 2 additional 2-0 ethilon horizontal mattress buttressing sutures. Dermabond was applied. Sterile bandage was applied. FINDINGS: Mal-apposed medial aspect of the incision, likely site of prior silver nitrate application which thwarted incisional healing. No evidence of purulence or erythema about the port site. Excision of prior incision with primary repair. IMPRESSION: Technically successful excision of prior nonhealing port incision. PLAN: Return in 2 weeks for wound check and buttressing suture removal. Allison Cancer, MD [facility] Electronically Signed   By: Allison [name] M.D.   On: 04/15/2021 15:57   DG C-ARM BRONCHOSCOPY  Result Date: 04/21/2021 C-ARM BRONCHOSCOPY: Fluoroscopy was utilized by the requesting physician.  No radiographic  interpretation.     ASSESSMENT AND PLAN: This is a very pleasant 70 years old never smoker African-American female recently with a stage IV non-small cell lung [name], adenocarcinoma with positive EGFR mutation with deletion in exon 19. The patient was started on treatment with Tagrisso 80 mg p.o. daily status post 35 months of treatment. She has been tolerating this treatment well with no concerning adverse effects except for intermittent diarrhea. She had repeat CT scan of the chest, abdomen pelvis performed recently.  I personally and independently reviewed the scans and discussed the results with the patient and her boyfriend today. Unfortunately the CT scan showed interval progression of the right apical lung mass in addition to progression of mediastinal lymphadenopathy concerning for worsening of her disease. She has no actionable resistant mutation on the molecular studies performed by [facility] [employer]. The patient continued her current treatment with Tagrisso and tolerating it fairly well. 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 Sondra Come completed January 05, 2021. The patient had significant opacities in her lung that was initially thought to be secondary to radiation treatment versus Tagrisso induced pneumonitis versus lymphangitic spread of the tumor.  She was treated with high-dose taper regiment of prednisone Repeat imaging studies after the palliative radiotherapy showed evidence for disease progression. Her molecular studies by [facility] recently showed no new resistant mutation.  After discussion of her treatment options including palliative care and hospice referral versus palliative systemic chemotherapy the patient was interested in proceeding palliative systemic chemotherapy.  She started  palliative systemic chemotherapy with carboplatin for AUC of 5 and Alimta 500 Mg/M2 every 3  weeks.  Status post 2 cycles.  I did not add a Avastin to her treatment because of the recent stroke.  She also continued her treatment with Tagrisso at the same time.  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 Repeat CT scan of the chest performed recently showed no concerning findings for disease progression and there was significant improvement in the opacity and airspace disease in the left lung but there was increased consolidation in the  right hilar area likely secondary to her previous radiotherapy. I recommended for the patient to hold her treatment by 1 week until improvement of her general condition especially with the recent hospitalization and discharge from the hospital 2 days ago. Regarding the the deep venous thrombosis, she is currently on Lovenox twice daily.  She is concerned about the frequency of the injection.  I recommended for the patient to change her dose of Lovenox to 1.5 Mg/KG once daily which will be calculated to 80 Mg subcutaneously on daily basis.  The patient received her Lovenox prescription from her primary care physician Dr. [name] and she will ask her for refill with the new regimen. Regarding the chemotherapy-induced anemia, the patient was encouraged to increase her iron rich diet and also consider iron supplement at this time.  We will continue to monitor her closely and consider The patient for PRBCs transfusion if her hemoglobin is less than 8. I will see her back for follow-up visit in 4 weeks for evaluation before starting cycle #4 of her treatment. The patient was advised to call immediately if she has any other concerning symptoms in the interval. The patient and her daughter have a lot of questions today and I answered her their questions to their satisfaction. She was also referred to [name] to see Dr. Durenda Hurt but the patient has not received the  appointment yet from them. The patient was advised to call immediately if she has any other concerning symptoms in the interval.  The patient voices understanding of current disease status and treatment options and is in agreement with the current care plan. All questions were answered. The patient knows to call the clinic with any problems, questions or concerns. We can certainly see the patient much sooner if necessary. The total time spent in the appointment was 55 minutes.  Disclaimer: This note was dictated with voice recognition software. Similar sounding words can inadvertently be transcribed and may not be corrected upon review.

## 2021-04-29 ENCOUNTER — Telehealth: Payer: Self-pay

## 2021-04-29 ENCOUNTER — Telehealth: Payer: Self-pay | Admitting: *Deleted

## 2021-04-29 NOTE — Telephone Encounter (Signed)
I received a message from Dr. Valeta Harms that Ms. Allison [name] would like an update on her [facility] results.  I called Ms. Allison [name] and updated her that they are resulted but I need Dr. Julien Nordmann to interpret them.  I will notify Dr. Julien Nordmann that results are completed.  She is also asking about her referral to [facility] and when that will be. I said that I will reach out to the Lung cancer navigator at [facility] to get an update.  I explained that they will call her with an appt. I securely emailed [facility] navigator to get an update.  Ms. Allison [name] was thankful for the call and update.

## 2021-04-29 NOTE — Telephone Encounter (Signed)
Referral, face sheet, last OV note, cytology report and molecular studies have been faxed to Dr. Aniceto Boss at [facility]

## 2021-04-29 NOTE — Telephone Encounter (Signed)
I returned Allison [name]'s phone call regarding a question about her Promethazine medication order placed yesterday. She asked if it was available and which pharmacy it was located at. I called and found out it is ready to be picked up at the pharmacy on [address]. I relayed this information to Allison [name], who verbalized gratitude and understanding. I answered her and her daughter's questions about the medication. I advised her to take it 3 times a day as needed, that it could be taken with or without food, and that she didn't need to change any other medications. All questions were answered. Understanding was verbalized. I advised Allison [name] to call us with any questions/concerns.

## 2021-04-29 NOTE — Telephone Encounter (Signed)
BI please advise.  thanks

## 2021-05-02 DIAGNOSIS — M6281 Muscle weakness (generalized): Secondary | ICD-10-CM | POA: Diagnosis not present

## 2021-05-02 DIAGNOSIS — C3411 Malignant neoplasm of upper lobe, right bronchus or lung: Secondary | ICD-10-CM | POA: Diagnosis not present

## 2021-05-02 DIAGNOSIS — R262 Difficulty in walking, not elsewhere classified: Secondary | ICD-10-CM | POA: Diagnosis not present

## 2021-05-02 DIAGNOSIS — I10 Essential (primary) hypertension: Secondary | ICD-10-CM | POA: Diagnosis not present

## 2021-05-03 ENCOUNTER — Ambulatory Visit
Admission: RE | Admit: 2021-05-03 | Discharge: 2021-05-03 | Disposition: A | Discharge status: Home / Self Care | Payer: Medicare HMO | Source: Ambulatory Visit | Attending: Adult Health | Admitting: Adult Health

## 2021-05-03 ENCOUNTER — Other Ambulatory Visit: Payer: Self-pay

## 2021-05-03 DIAGNOSIS — I639 Cerebral infarction, unspecified: Secondary | ICD-10-CM

## 2021-05-03 MED ORDER — GADOBENATE DIMEGLUMINE 529 MG/ML IV SOLN
10.0000 mL | Freq: Once | INTRAVENOUS | Status: AC | PRN
  Administered 2021-05-03: 10 mL via INTRAVENOUS

## 2021-05-04 ENCOUNTER — Other Ambulatory Visit: Payer: Self-pay | Admitting: Internal Medicine

## 2021-05-04 DIAGNOSIS — E639 Nutritional deficiency, unspecified: Secondary | ICD-10-CM | POA: Diagnosis not present

## 2021-05-04 DIAGNOSIS — C3491 Malignant neoplasm of unspecified part of right bronchus or lung: Secondary | ICD-10-CM | POA: Diagnosis not present

## 2021-05-04 DIAGNOSIS — R0989 Other specified symptoms and signs involving the circulatory and respiratory systems: Secondary | ICD-10-CM | POA: Diagnosis not present

## 2021-05-04 DIAGNOSIS — R0789 Other chest pain: Secondary | ICD-10-CM | POA: Diagnosis not present

## 2021-05-04 DIAGNOSIS — R0600 Dyspnea, unspecified: Secondary | ICD-10-CM | POA: Diagnosis not present

## 2021-05-04 DIAGNOSIS — R633 Feeding difficulties, unspecified: Secondary | ICD-10-CM | POA: Diagnosis not present

## 2021-05-05 ENCOUNTER — Inpatient Hospital Stay: Payer: Medicare HMO

## 2021-05-05 ENCOUNTER — Other Ambulatory Visit: Payer: Self-pay

## 2021-05-05 ENCOUNTER — Ambulatory Visit (INDEPENDENT_AMBULATORY_CARE_PROVIDER_SITE_OTHER): Payer: Medicare HMO | Admitting: Pulmonary Disease

## 2021-05-05 ENCOUNTER — Other Ambulatory Visit: Payer: Self-pay | Admitting: Nurse Practitioner

## 2021-05-05 ENCOUNTER — Encounter: Payer: Self-pay | Admitting: Pulmonary Disease

## 2021-05-05 VITALS — BP 122/62 | HR 104 | Temp 97.8°F | Ht 64.0 in | Wt 117.6 lb

## 2021-05-05 VITALS — BP 132/66 | HR 80 | Temp 98.0°F | Resp 20 | Wt 117.2 lb

## 2021-05-05 DIAGNOSIS — Z66 Do not resuscitate: Secondary | ICD-10-CM | POA: Diagnosis not present

## 2021-05-05 DIAGNOSIS — R531 Weakness: Secondary | ICD-10-CM | POA: Diagnosis not present

## 2021-05-05 DIAGNOSIS — C3491 Malignant neoplasm of unspecified part of right bronchus or lung: Secondary | ICD-10-CM

## 2021-05-05 DIAGNOSIS — C3411 Malignant neoplasm of upper lobe, right bronchus or lung: Secondary | ICD-10-CM | POA: Diagnosis not present

## 2021-05-05 DIAGNOSIS — C3490 Malignant neoplasm of unspecified part of unspecified bronchus or lung: Secondary | ICD-10-CM | POA: Diagnosis not present

## 2021-05-05 DIAGNOSIS — R052 Subacute cough: Secondary | ICD-10-CM

## 2021-05-05 DIAGNOSIS — G893 Neoplasm related pain (acute) (chronic): Secondary | ICD-10-CM | POA: Diagnosis not present

## 2021-05-05 DIAGNOSIS — C773 Secondary and unspecified malignant neoplasm of axilla and upper limb lymph nodes: Secondary | ICD-10-CM | POA: Diagnosis not present

## 2021-05-05 DIAGNOSIS — Z515 Encounter for palliative care: Secondary | ICD-10-CM | POA: Diagnosis not present

## 2021-05-05 DIAGNOSIS — R059 Cough, unspecified: Secondary | ICD-10-CM | POA: Diagnosis not present

## 2021-05-05 DIAGNOSIS — D539 Nutritional anemia, unspecified: Secondary | ICD-10-CM

## 2021-05-05 DIAGNOSIS — Z79899 Other long term (current) drug therapy: Secondary | ICD-10-CM | POA: Diagnosis not present

## 2021-05-05 DIAGNOSIS — Z79891 Long term (current) use of opiate analgesic: Secondary | ICD-10-CM | POA: Diagnosis not present

## 2021-05-05 DIAGNOSIS — Z5111 Encounter for antineoplastic chemotherapy: Secondary | ICD-10-CM | POA: Diagnosis not present

## 2021-05-05 LAB — CMP (CANCER CENTER ONLY)
ALT: 16 U/L (ref 0–44)
AST: 19 U/L (ref 15–41)
Albumin: 3.5 g/dL (ref 3.5–5.0)
Alkaline Phosphatase: 54 U/L (ref 38–126)
Anion gap: 9 (ref 5–15)
BUN: 13 mg/dL (ref 8–23)
CO2: 28 mmol/L (ref 22–32)
Calcium: 9.8 mg/dL (ref 8.9–10.3)
Chloride: 99 mmol/L (ref 98–111)
Creatinine: 0.89 mg/dL (ref 0.44–1.00)
GFR, Estimated: >60 mL/min (ref >60)
Glucose, Bld: 97 mg/dL (ref 70–99)
Potassium: 3.3 mmol/L — ABNORMAL LOW (ref 3.5–5.1)
Sodium: 136 mmol/L (ref 135–145)
Total Bilirubin: 0.2 mg/dL — ABNORMAL LOW (ref 0.3–1.2)
Total Protein: 7 g/dL (ref 6.5–8.1)

## 2021-05-05 LAB — VITAMIN B12: Vitamin B-12: 4303 pg/mL — ABNORMAL HIGH (ref 180–914)

## 2021-05-05 LAB — CBC WITH DIFFERENTIAL (CANCER CENTER ONLY)
Abs Immature Granulocytes: 0.11 10*3/uL — ABNORMAL HIGH (ref 0.00–0.07)
Basophils Absolute: 0.1 10*3/uL (ref 0.0–0.1)
Basophils Relative: 1 %
Eosinophils Absolute: 0 10*3/uL (ref 0.0–0.5)
Eosinophils Relative: 0 %
HCT: 27.8 % — ABNORMAL LOW (ref 36.0–46.0)
Hemoglobin: 8.8 g/dL — ABNORMAL LOW (ref 12.0–15.0)
Immature Granulocytes: 1 %
Lymphocytes Relative: 11 %
Lymphs Abs: 0.9 10*3/uL (ref 0.7–4.0)
MCH: 26 pg (ref 26.0–34.0)
MCHC: 31.7 g/dL (ref 30.0–36.0)
MCV: 82 fL (ref 80.0–100.0)
Monocytes Absolute: 1.3 10*3/uL — ABNORMAL HIGH (ref 0.1–1.0)
Monocytes Relative: 17 %
Neutro Abs: 5.3 10*3/uL (ref 1.7–7.7)
Neutrophils Relative %: 70 %
Platelet Count: 475 10*3/uL — ABNORMAL HIGH (ref 150–400)
RBC: 3.39 MIL/uL — ABNORMAL LOW (ref 3.87–5.11)
RDW: 18 % — ABNORMAL HIGH (ref 11.5–15.5)
WBC Count: 7.7 10*3/uL (ref 4.0–10.5)
nRBC: 0 % (ref 0.0–0.2)

## 2021-05-05 LAB — FERRITIN: Ferritin: 226 ng/mL (ref 11–307)

## 2021-05-05 LAB — IRON AND TIBC
Iron: 37 ug/dL (ref 28–170)
Saturation Ratios: 10 % — ABNORMAL LOW (ref 10.4–31.8)
TIBC: 371 ug/dL (ref 250–450)
UIBC: 334 ug/dL

## 2021-05-05 LAB — FOLATE: Folate: 31.8 ng/mL (ref >5.9)

## 2021-05-05 MED ORDER — SODIUM CHLORIDE 0.9 % IV SOLN
150.0000 mg | Freq: Once | INTRAVENOUS | Status: AC
  Administered 2021-05-05: 11:00:00 150 mg via INTRAVENOUS
  Filled 2021-05-05: qty 5

## 2021-05-05 MED ORDER — SODIUM CHLORIDE 0.9 % IV SOLN
10.0000 mg | Freq: Once | INTRAVENOUS | Status: AC
  Administered 2021-05-05: 11:00:00 10 mg via INTRAVENOUS
  Filled 2021-05-05: qty 1

## 2021-05-05 MED ORDER — PALONOSETRON HCL INJECTION 0.25 MG/5ML
0.2500 mg | Freq: Once | INTRAVENOUS | Status: AC
  Administered 2021-05-05: 11:00:00 0.25 mg via INTRAVENOUS
  Filled 2021-05-05: qty 5

## 2021-05-05 MED ORDER — SODIUM CHLORIDE 0.9 % IV SOLN: Freq: Once | INTRAVENOUS | Status: AC

## 2021-05-05 MED ORDER — SODIUM CHLORIDE 0.9 % IV SOLN
500.0000 mg/m2 | Freq: Once | INTRAVENOUS | Status: AC
  Administered 2021-05-05: 12:00:00 800 mg via INTRAVENOUS
  Filled 2021-05-05: qty 20

## 2021-05-05 MED ORDER — MORPHINE SULFATE ER 15 MG PO TBCR: 15.0000 mg | EXTENDED_RELEASE_TABLET | Freq: Two times a day (BID) | ORAL | 0 refills | Status: AC

## 2021-05-05 MED ORDER — HEPARIN SOD (PORK) LOCK FLUSH 100 UNIT/ML IV SOLN
500.0000 [IU] | Freq: Once | INTRAVENOUS | Status: AC | PRN
  Administered 2021-05-05: 13:00:00 500 [IU]

## 2021-05-05 MED ORDER — CYANOCOBALAMIN 1000 MCG/ML IJ SOLN
1000.0000 ug | Freq: Once | INTRAMUSCULAR | Status: AC
  Administered 2021-05-05: 12:00:00 1000 ug via INTRAMUSCULAR
  Filled 2021-05-05: qty 1

## 2021-05-05 MED ORDER — SODIUM CHLORIDE 0.9% FLUSH
10.0000 mL | INTRAVENOUS | Status: DC | PRN
  Administered 2021-05-05: 13:00:00 10 mL

## 2021-05-05 MED ORDER — SODIUM CHLORIDE 0.9 % IV SOLN
346.0000 mg | Freq: Once | INTRAVENOUS | Status: AC
  Administered 2021-05-05: 12:00:00 350 mg via INTRAVENOUS
  Filled 2021-05-05: qty 35

## 2021-05-05 NOTE — Patient Instructions (Signed)
[facility]   Discharge Instructions: Thank you for choosing [facility] to provide your oncology and hematology care.   If you have a lab appointment with the [facility], please go directly to the [facility] and check in at the registration area.   Wear comfortable clothing and clothing appropriate for easy access to any Portacath or PICC line.   We strive to give you quality time with your provider. You may need to reschedule your appointment if you arrive late (15 or more minutes).  Arriving late affects you and other patients whose appointments are after yours.  Also, if you miss three or more appointments without notifying the office, you may be dismissed from the clinic at the provider's discretion.      For prescription refill requests, have your pharmacy contact our office and allow 72 hours for refills to be completed.    Today you received the following chemotherapy and/or immunotherapy agents Pemetrexed, Carboplatin      To help prevent nausea and vomiting after your treatment, we encourage you to take your nausea medication as directed.  BELOW ARE SYMPTOMS THAT SHOULD BE REPORTED IMMEDIATELY: *FEVER GREATER THAN 100.4 F (38 C) OR HIGHER *CHILLS OR SWEATING *NAUSEA AND VOMITING THAT IS NOT CONTROLLED WITH YOUR NAUSEA MEDICATION *UNUSUAL SHORTNESS OF BREATH *UNUSUAL BRUISING OR BLEEDING *URINARY PROBLEMS (pain or burning when urinating, or frequent urination) *BOWEL PROBLEMS (unusual diarrhea, constipation, pain near the anus) TENDERNESS IN MOUTH AND THROAT WITH OR WITHOUT PRESENCE OF ULCERS (sore throat, sores in mouth, or a toothache) UNUSUAL RASH, SWELLING OR PAIN  UNUSUAL VAGINAL DISCHARGE OR ITCHING   Items with * indicate a potential emergency and should be followed up as soon as possible or go to the Emergency Department if any problems should occur.  Please show the CHEMOTHERAPY ALERT CARD or IMMUNOTHERAPY ALERT CARD at  check-in to the Emergency Department and triage nurse.  Should you have questions after your visit or need to cancel or reschedule your appointment, please contact [facility]  Dept: [phone]  and follow the prompts.  Office hours are 8:00 a.m. to 4:30 p.m. Monday - Friday. Please note that voicemails left after 4:00 p.m. may not be returned until the following business day.  We are closed weekends and major holidays. You have access to a nurse at all times for urgent questions. Please call the main number to the clinic Dept: [phone] and follow the prompts.   For any non-urgent questions, you may also contact your provider using MyChart. We now offer e-Visits for anyone 70 and older to request care online for non-urgent symptoms. For details visit [email or web address].   Also download the MyChart app! Go to the app store, search "MyChart", open the app, select [facility], and log in with your MyChart username and password.  Due to Covid, a mask is required upon entering the hospital/clinic. If you do not have a mask, one will be given to you upon arrival. For doctor visits, patients may have 1 support person aged 66 or older with them. For treatment visits, patients cannot have anyone with them due to current Covid guidelines and our immunocompromised population.   Pemetrexed injection What is this medication? PEMETREXED (PEM e TREX ed) is a chemotherapy drug used to treat lung cancers like non-small cell lung cancer and mesothelioma. It may also be used to treat other cancers. This medicine may be used for other purposes; ask your health  care provider or pharmacist if you have questions. COMMON BRAND NAME(S): Alimta, PEMFEXY What should I tell my care team before I take this medication? They need to know if you have any of these conditions: infection (especially a virus infection such as chickenpox, cold sores, or herpes) kidney disease low blood counts,  like low white cell, platelet, or red cell counts lung or breathing disease, like asthma radiation therapy an unusual or allergic reaction to pemetrexed, other medicines, foods, dyes, or preservative pregnant or trying to get pregnant breast-feeding How should I use this medication? This drug is given as an infusion into a vein. It is administered in a hospital or clinic by a specially trained health care professional. Talk to your pediatrician regarding the use of this medicine in children. Special care may be needed. Overdosage: If you think you have taken too much of this medicine contact a poison control center or emergency room at once. NOTE: This medicine is only for you. Do not share this medicine with others. What if I miss a dose? It is important not to miss your dose. Call your doctor or health care professional if you are unable to keep an appointment. What may interact with this medication? This medicine may interact with the following medications: Ibuprofen This list may not describe all possible interactions. Give your health care provider a list of all the medicines, herbs, non-prescription drugs, or dietary supplements you use. Also tell them if you smoke, drink alcohol, or use illegal drugs. Some items may interact with your medicine. What should I watch for while using this medication? Visit your doctor for checks on your progress. This drug may make you feel generally unwell. This is not uncommon, as chemotherapy can affect healthy cells as well as cancer cells. Report any side effects. Continue your course of treatment even though you feel ill unless your doctor tells you to stop. In some cases, you may be given additional medicines to help with side effects. Follow all directions for their use. Call your doctor or health care professional for advice if you get a fever, chills or sore throat, or other symptoms of a cold or flu. Do not treat yourself. This drug decreases your  body's ability to fight infections. Try to avoid being around people who are sick. This medicine may increase your risk to bruise or bleed. Call your doctor or health care professional if you notice any unusual bleeding. Be careful brushing and flossing your teeth or using a toothpick because you may get an infection or bleed more easily. If you have any dental work done, tell your dentist you are receiving this medicine. Avoid taking products that contain aspirin, acetaminophen, ibuprofen, naproxen, or ketoprofen unless instructed by your doctor. These medicines may hide a fever. Call your doctor or health care professional if you get diarrhea or mouth sores. Do not treat yourself. To protect your kidneys, drink water or other fluids as directed while you are taking this medicine. Do not become pregnant while taking this medicine or for 6 months after stopping it. Women should inform their doctor if they wish to become pregnant or think they might be pregnant. Men should not father a child while taking this medicine and for 3 months after stopping it. This may interfere with the ability to father a child. You should talk to your doctor or health care professional if you are concerned about your fertility. There is a potential for serious side effects to an unborn child. Talk  to your health care professional or pharmacist for more information. Do not breast-feed an infant while taking this medicine or for 1 week after stopping it. What side effects may I notice from receiving this medication? Side effects that you should report to your doctor or health care professional as soon as possible: allergic reactions like skin rash, itching or hives, swelling of the face, lips, or tongue breathing problems redness, blistering, peeling or loosening of the skin, including inside the mouth signs and symptoms of bleeding such as bloody or black, tarry stools; red or dark-brown urine; spitting up blood or brown  material that looks like coffee grounds; red spots on the skin; unusual bruising or bleeding from the eye, gums, or nose signs and symptoms of infection like fever or chills; cough; sore throat; pain or trouble passing urine signs and symptoms of kidney injury like trouble passing urine or change in the amount of urine signs and symptoms of liver injury like dark yellow or brown urine; general ill feeling or flu-like symptoms; light-colored stools; loss of appetite; nausea; right upper belly pain; unusually weak or tired; yellowing of the eyes or skin Side effects that usually do not require medical attention (report to your doctor or health care professional if they continue or are bothersome): constipation mouth sores nausea, vomiting unusually weak or tired This list may not describe all possible side effects. Call your doctor for medical advice about side effects. You may report side effects to FDA at 1-800-FDA-1088. Where should I keep my medication? This drug is given in a hospital or clinic and will not be stored at home. NOTE: This sheet is a summary. It may not cover all possible information. If you have questions about this medicine, talk to your doctor, pharmacist, or health care provider.  2022 Elsevier/Gold Standard (2017-07-31 00:00:00)  Carboplatin injection What is this medication? CARBOPLATIN (KAR boe pla tin) is a chemotherapy drug. It targets fast dividing cells, like cancer cells, and causes these cells to die. This medicine is used to treat ovarian cancer and many other cancers. This medicine may be used for other purposes; ask your health care provider or pharmacist if you have questions. COMMON BRAND NAME(S): Paraplatin What should I tell my care team before I take this medication? They need to know if you have any of these conditions: blood disorders hearing problems kidney disease recent or ongoing radiation therapy an unusual or allergic reaction to carboplatin,  cisplatin, other chemotherapy, other medicines, foods, dyes, or preservatives pregnant or trying to get pregnant breast-feeding How should I use this medication? This drug is usually given as an infusion into a vein. It is administered in a hospital or clinic by a specially trained health care professional. Talk to your pediatrician regarding the use of this medicine in children. Special care may be needed. Overdosage: If you think you have taken too much of this medicine contact a poison control center or emergency room at once. NOTE: This medicine is only for you. Do not share this medicine with others. What if I miss a dose? It is important not to miss a dose. Call your doctor or health care professional if you are unable to keep an appointment. What may interact with this medication? medicines for seizures medicines to increase blood counts like filgrastim, pegfilgrastim, sargramostim some antibiotics like amikacin, gentamicin, neomycin, streptomycin, tobramycin vaccines Talk to your doctor or health care professional before taking any of these medicines: acetaminophen aspirin ibuprofen ketoprofen naproxen This list may not  describe all possible interactions. Give your health care provider a list of all the medicines, herbs, non-prescription drugs, or dietary supplements you use. Also tell them if you smoke, drink alcohol, or use illegal drugs. Some items may interact with your medicine. What should I watch for while using this medication? Your condition will be monitored carefully while you are receiving this medicine. You will need important blood work done while you are taking this medicine. This drug may make you feel generally unwell. This is not uncommon, as chemotherapy can affect healthy cells as well as cancer cells. Report any side effects. Continue your course of treatment even though you feel ill unless your doctor tells you to stop. In some cases, you may be given additional  medicines to help with side effects. Follow all directions for their use. Call your doctor or health care professional for advice if you get a fever, chills or sore throat, or other symptoms of a cold or flu. Do not treat yourself. This drug decreases your body's ability to fight infections. Try to avoid being around people who are sick. This medicine may increase your risk to bruise or bleed. Call your doctor or health care professional if you notice any unusual bleeding. Be careful brushing and flossing your teeth or using a toothpick because you may get an infection or bleed more easily. If you have any dental work done, tell your dentist you are receiving this medicine. Avoid taking products that contain aspirin, acetaminophen, ibuprofen, naproxen, or ketoprofen unless instructed by your doctor. These medicines may hide a fever. Do not become pregnant while taking this medicine. Women should inform their doctor if they wish to become pregnant or think they might be pregnant. There is a potential for serious side effects to an unborn child. Talk to your health care professional or pharmacist for more information. Do not breast-feed an infant while taking this medicine. What side effects may I notice from receiving this medication? Side effects that you should report to your doctor or health care professional as soon as possible: allergic reactions like skin rash, itching or hives, swelling of the face, lips, or tongue signs of infection - fever or chills, cough, sore throat, pain or difficulty passing urine signs of decreased platelets or bleeding - bruising, pinpoint red spots on the skin, black, tarry stools, nosebleeds signs of decreased red blood cells - unusually weak or tired, fainting spells, lightheadedness breathing problems changes in hearing changes in vision chest pain high blood pressure low blood counts - This drug may decrease the number of white blood cells, red blood cells and  platelets. You may be at increased risk for infections and bleeding. nausea and vomiting pain, swelling, redness or irritation at the injection site pain, tingling, numbness in the hands or feet problems with balance, talking, walking trouble passing urine or change in the amount of urine Side effects that usually do not require medical attention (report to your doctor or health care professional if they continue or are bothersome): hair loss loss of appetite metallic taste in the mouth or changes in taste This list may not describe all possible side effects. Call your doctor for medical advice about side effects. You may report side effects to FDA at 1-800-FDA-1088. Where should I keep my medication? This drug is given in a hospital or clinic and will not be stored at home. NOTE: This sheet is a summary. It may not cover all possible information. If you have questions about this medicine,  talk to your doctor, pharmacist, or health care provider.  2022 Elsevier/Gold Standard (2007-11-13 00:00:00)

## 2021-05-05 NOTE — Progress Notes (Signed)
Called Allison [name] in lab to check on results of blood work after 1 hour wait. Was assured results would be resulted within 15 minutes  Patient presents for treatment. RN assessment completed along with the following:  Labs/vitals reviewed - Yes, and within treatment parameters.   Weight within 10% of previous measurement - Yes Oncology Treatment Attestation completed for current therapy- Yes, on date 02/18/21 Informed consent completed and reflects current therapy/intent - Yes, on date 03/17/21             Provider progress note reviewed - Patient not seen by provider today. Most recent note dated 04/28/21 reviewed. Treatment/Antibody/Supportive plan reviewed - Yes, and there are no adjustments needed for today's treatment. S&H and other orders reviewed - Yes, and there are no additional orders identified. Previous treatment date reviewed - Yes, and the appropriate amount of time has elapsed between treatments. Clinic Hand Off Received from - none   Patient to proceed with treatment.

## 2021-05-05 NOTE — Patient Instructions (Signed)
Thank you for visiting Dr. Valeta Harms at [facility]. Today we recommend the following:  Call me if you need me   Return in about 3 months (around 08/05/2021) for w/ Dr. Valeta Harms .    Please do your part to reduce the spread of COVID-19.

## 2021-05-05 NOTE — Progress Notes (Signed)
Synopsis: Referred in October 2022 for stage IV lung cancer by [name], MD  Subjective:   PATIENT ID: Allison [name] GENDER: female DOB: 12/11/1950, MRN: [id number]  Chief Complaint  Patient presents with   Follow-up    This is a 70 year old female, past medical history of stage IV adenocarcinoma of the lung.  Was initially diagnosed with a pleural effusion in July 2019.  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  Initially had a Pleurx catheter placed.  Had palliative radiotherapy and radiation as well as placement on Tagrisso.  Overall doing well for treatment.  Unfortunately CT imaging revealed progression of disease.  Has started chemotherapy with Dr. [name].  CT scan of the chest on 03/04/2021 was completed.  She had evidence of septal thickening nodularity extensive pleural thickening loculated right effusion concern for lymphangitic spread.  Patient was referred today to discuss bronchoscopy and biopsy.  Some of these lesions have a chance for additional mutations with therapy.  Therefore oncology is requesting a repeat biopsy.  OV 05/05/2021: Here today for follow-up after bronchoscopy.  Patient was taken for navigational bronchoscopy as well as transbronchial biopsies to the persistent infiltrates on the right lung.  Tissue from the mass on the left was negative for malignancy.  Patient also underwent transbronchial biopsies on the right.  This was also negative for malignancy.  Unfortunately, she developed a right-sided pneumothorax post endoscopy had a chest tube and a short hospitalization afterwards.  She is doing much better since her hospitalization.After bronchoscopy patient had follow-up on 04/07/2021 with medical oncology.  Office note reviewed.  Unfortunately felt to have progression of disease.  Discussed several options palliative versus treatment regimen versus arranging restaging CTs or referral for second opinion.  We took patient  on 04/21/2021 for repeat bronchoscopy. Follow-up appointment on 04/28/2021 with Dr. [name].   Oncology History Overview Note  Patient presented with cough, congestion, SOB, fatigue, and wight loss.  Work up showed metastatic disease.    Adenocarcinoma of right lung, stage 4 ([place])  12/11/2017 Imaging   CT Chest IMPRESSION: 1. Large right pleural effusion with volume loss of much of the right lung. 2. Right upper lobe spiculated mass of 2.3 x 2.9 cm suspicious for primary lung carcinoma. 3. Several small nodules throughout the right lung and left lung suspicious for metastases. Some of the nodules on the right are intimately associated with pleura and could represent pleural metastases. 4. Questionable soft tissue nodule in the left upper abdomen which could represent a metastatic lesion. 5. Mixed lesion within the T9 vertebral body. Cannot exclude a metastatic process.   12/19/2017 Procedure   Thoracentesis   12/26/2017 Imaging   PET IMPRESSION: 1. Hypermetabolic spiculated 3.3 cm apical right upper lobe lung mass, compatible with primary bronchogenic adenocarcinoma. 2. Hypermetabolic right hilar and right paratracheal lymphadenopathy. 3. Moderate dependent right pleural effusion with mild right pleural thickening, compatible with known malignant right pleural effusion. 4. Hypermetabolic 1.6 cm anterior right lower lobe nodule associated with the major fissure, compatible with pulmonary metastasis. Numerous additional subcentimeter pulmonary nodules scattered in both lungs, below PET resolution, suspicious for pulmonary metastases. Recommend attention on follow-up chest CT in 3 months   01/04/2018 Initial Diagnosis   Adenocarcinoma of right lung, stage 4 ([place])   01/10/2018 Imaging   MRI Brain  No evidence of intracranial metastases   01/29/2018 -  Chemotherapy   Oral biologic Tagrisso p.o. 80 mg    08/16/2020 Cancer  Staging   Staging form: Lung, AJCC 8th Edition -  Clinical: Stage IVA (cT2a, cN2, cM1a) - Signed by Curt Bears, MD on 08/16/2020    03/17/2021 -  Chemotherapy   Patient is on Treatment Plan : LUNG NSCLC Pemetrexed + Carboplatin q21d x 4 Cycles        Past Medical History:  Diagnosis Date   Anemia    Anxiety    Arthritis    Asthma    exercise induced   Depression    PMH   Dyspnea    GERD (gastroesophageal reflux disease)    Glaucoma    History of radiation therapy 01/05/2021   IMRT right lung  11/24/2020-01/05/2021  Dr [name]   Hypertension    lung ca dx'd 11/2017   right   Malignant pleural effusion    right   PONV (postoperative nausea and vomiting)    Pre-diabetes    Raynaud's disease    Raynaud's disease    Stroke ([place]) 01/2021   balance off, some express aphasia, weakness     Family History  Problem Relation Age of Onset   Heart disease Sister    Heart disease Brother    Lung cancer Other      Past Surgical History:  Procedure Laterality Date   ABDOMINAL HYSTERECTOMY     partial   BRONCHIAL BIOPSY  04/21/2021   Procedure: BRONCHIAL BIOPSIES;  Surgeon: [name], DO;  Location: [facility];  Service: Pulmonary;;   BRONCHIAL BRUSHINGS  04/21/2021   Procedure: BRONCHIAL BRUSHINGS;  Surgeon: [name], DO;  Location: [facility];  Service: Pulmonary;;   BRONCHIAL NEEDLE ASPIRATION BIOPSY  04/21/2021   Procedure: BRONCHIAL NEEDLE ASPIRATION BIOPSIES;  Surgeon: [name], DO;  Location: [facility];  Service: Pulmonary;;   CHEST TUBE INSERTION Right 01/01/2018   Procedure: INSERTION PLEURAL DRAINAGE CATHETER;  Surgeon: Ivin Poot, MD;  Location: [facility];  Service: Thoracic;  Laterality: Right;   CHEST TUBE INSERTION  04/21/2021   Procedure: CHEST TUBE INSERTION;  Surgeon: [name], DO;  Location: [facility];  Service: Pulmonary;;   COLONOSCOPY     CORONARY STENT INTERVENTION N/A 06/16/2019   Procedure: CORONARY STENT INTERVENTION;  Surgeon: Jettie Booze, MD;   Location: [facility];  Service: Cardiovascular;  Laterality: N/A;   DILATION AND CURETTAGE OF UTERUS     EYE SURGERY     due to Glaucoma   IR IMAGING GUIDED PORT INSERTION  03/22/2021   IR PORT REPAIR CENTRAL VENOUS ACCESS DEVICE  04/15/2021   LEFT HEART CATH AND CORONARY ANGIOGRAPHY N/A 06/16/2019   Procedure: LEFT HEART CATH AND CORONARY ANGIOGRAPHY;  Surgeon: Jettie Booze, MD;  Location: [facility];  Service: Cardiovascular;  Laterality: N/A;   REMOVAL OF PLEURAL DRAINAGE CATHETER Right 11/07/2018   Procedure: REMOVAL OF PLEURAL DRAINAGE CATHETER;  Surgeon: Ivin Poot, MD;  Location: [facility];  Service: Thoracic;  Laterality: Right;   ROTATOR CUFF REPAIR     TUBAL LIGATION     VIDEO BRONCHOSCOPY WITH ENDOBRONCHIAL NAVIGATION Bilateral 04/21/2021   Procedure: VIDEO BRONCHOSCOPY WITH ENDOBRONCHIAL NAVIGATION;  Surgeon: [name], DO;  Location: [facility];  Service: Pulmonary;  Laterality: Bilateral;  ION   WISDOM TOOTH EXTRACTION      Social History   Socioeconomic History   Marital status: Widowed    Spouse name: Not on file   Number of children: 3   Years of education: Not on file  Highest education level: Not on file  Occupational History    Employer: [employer]  Tobacco Use   Smoking status: Never   Smokeless tobacco: Never  Vaping Use   Vaping Use: Never used  Substance and Sexual Activity   Alcohol use: Not Currently    Comment: up to 3 drinks per week   Drug use: No    Comment: CBD oil    Sexual activity: Not Currently    Comment: Hysterectomy  Other Topics Concern   Not on file  Social History Narrative   Not on file   Social Determinants of Health   Financial Resource Strain: Not on file  Food Insecurity: Not on file  Transportation Needs: Not on file  Physical Activity: Not on file  Stress: Not on file  Social Connections: Not on file  Intimate Partner Violence: Not on file     Allergies  Allergen Reactions    Penicillins Other (See Comments)    SYNCOPE PATIENT HAS HAD A PCN REACTION WITH IMMEDIATE RASH, FACIAL/TONGUE/THROAT SWELLING, SOB, OR LIGHTHEADEDNESS WITH HYPOTENSION:  #  #  YES  #  # Has patient had a PCN reaction causing severe rash involving mucus membranes or skin necrosis: No Has patient had a PCN reaction that required hospitalization: No Has patient had a PCN reaction occurring within the last 10 years: No    Vicodin [Hydrocodone-Acetaminophen] Other (See Comments)    Sped up heart and breathing   Other Other (See Comments)    Glaucoma eye drop - turned eyes dark     Outpatient Medications Prior to Visit  Medication Sig Dispense Refill   acetaminophen (TYLENOL) 500 MG tablet Take 1,000 mg by mouth every 6 (six) hours as needed (pain).     ALPRAZolam (XANAX) 0.25 MG tablet Take 1 tablet (0.25 mg total) by mouth 3 (three) times daily as needed for anxiety. 30 tablet 0   amLODipine (NORVASC) 5 MG tablet Take 5 mg by mouth daily.     aspirin 81 MG chewable tablet Chew 1 tablet (81 mg total) by mouth daily.     b complex vitamins capsule Take 1 capsule by mouth daily.     brompheniramine-pseudoephedrine-DM 30-2-10 MG/5ML syrup Take 5 mLs by mouth 3 (three) times daily as needed. 120 mL 0   carvedilol (COREG) 12.5 MG tablet Take 1 tablet (12.5 mg total) by mouth 2 (two) times daily with a meal. 180 tablet 3   cetirizine (ZYRTEC) 5 MG tablet Take 5 mg by mouth daily.     chlorthalidone (HYGROTON) 25 MG tablet Take 1 tablet (25 mg total) by mouth daily. 30 tablet 6   dexamethasone (DECADRON) 1 MG tablet Take 1 tablet (1 mg total) by mouth 2 (two) times daily as needed (For appetite stimulation). 30 tablet 1   dorzolamide-timolol (COSOPT) 22.3-6.8 MG/ML ophthalmic solution Place 1 drop into both eyes 2 (two) times daily.     enoxaparin (LOVENOX) 60 MG/0.6ML injection Inject 0.6 mLs (60 mg total) into the skin every 12 (twelve) hours. 60 mL 1   esomeprazole (NEXIUM) 20 MG capsule Take 20 mg  by mouth in the morning and at bedtime.     ezetimibe (ZETIA) 10 MG tablet Take 10 mg by mouth daily.      FLUoxetine (PROZAC) 10 MG capsule Take 1 capsule (10 mg total) by mouth daily. 30 capsule 3   folic acid (FOLVITE) 1 MG tablet Take 1 tablet (1 mg total) by mouth daily. 30 tablet 4  guaiFENesin (MUCINEX) 600 MG 12 hr tablet Take 2 tablets (1,200 mg total) by mouth 2 (two) times daily. 30 tablet 10   ipratropium-albuterol (DUONEB) 0.5-2.5 (3) MG/3ML SOLN Take 3 mLs by nebulization every 6 (six) hours as needed (Asthma).     isosorbide mononitrate (IMDUR) 30 MG 24 hr tablet Take 1 tablet (30 mg total) by mouth daily. 90 tablet 3   latanoprost (XALATAN) 0.005 % ophthalmic solution Place 1 drop into both eyes at bedtime.      lidocaine (LIDODERM) 5 % Place 1 patch onto the skin daily. Remove & Discard patch within 12 hours or as directed by MD 30 patch 0   morphine (MS CONTIN) 15 MG 12 hr tablet Take 1 tablet (15 mg total) by mouth every 12 (twelve) hours. 60 tablet 0   Nutritional Supplements (,FEEDING SUPPLEMENT, PROSOURCE PLUS) liquid Take 30 mLs by mouth 2 (two) times daily between meals. 1800 mL 0   osimertinib mesylate (TAGRISSO) 80 MG tablet Take 1 tablet (80 mg total) by mouth daily. 30 tablet 3   OVER THE COUNTER MEDICATION Take 4 drops by mouth daily. Viatmin d     oxyCODONE (OXY IR/ROXICODONE) 5 MG immediate release tablet Take 1 tablet (5 mg total) by mouth every 4 (four) hours as needed for moderate pain. 30 tablet 0   prochlorperazine (COMPAZINE) 10 MG tablet Take 1 tablet (10 mg total) by mouth every 6 (six) hours as needed for nausea or vomiting. 30 tablet 0   REPATHA SURECLICK 563 MG/ML SOAJ Inject 140 mg into the skin every 14 (fourteen) days. 6 mL 3   sucralfate (CARAFATE) 1 g tablet Take 1 tablet (1 g total) by mouth 4 (four) times daily. with meals and at bedtime. Crush and dissolve in 10 mL of warm water prior to swallowing 120 tablet 0   potassium chloride SA (KLOR-CON) 20  MEQ tablet Take 2 tablets (40 mEq total) by mouth daily for 5 doses. 10 tablet 0   sodium chloride flush (NS) 0.9 % injection 10 mL      No facility-administered medications prior to visit.    Review of Systems  Constitutional:  Positive for malaise/fatigue and weight loss. Negative for chills and fever.  HENT:  Negative for hearing loss, sore throat and tinnitus.   Eyes:  Negative for blurred vision and double vision.  Respiratory:  Positive for shortness of breath. Negative for cough, hemoptysis, sputum production, wheezing and stridor.   Cardiovascular:  Negative for chest pain, palpitations, orthopnea, leg swelling and PND.  Gastrointestinal:  Negative for abdominal pain, constipation, diarrhea, heartburn, nausea and vomiting.  Genitourinary:  Negative for dysuria, hematuria and urgency.  Musculoskeletal:  Negative for joint pain and myalgias.  Skin:  Negative for itching and rash.  Neurological:  Positive for weakness. Negative for dizziness, tingling and headaches.  Endo/Heme/Allergies:  Negative for environmental allergies. Does not bruise/bleed easily.  Psychiatric/Behavioral:  Negative for depression. The patient is nervous/anxious. The patient does not have insomnia.   All other systems reviewed and are negative.   Objective:  Physical Exam Vitals reviewed.  Constitutional:      General: She is not in acute distress.    Appearance: She is well-developed.     Comments: Thin elderly FM  HENT:     Head: Normocephalic and atraumatic.  Eyes:     General: No scleral icterus.    Conjunctiva/sclera: Conjunctivae normal.     Pupils: Pupils are equal, round, and reactive to light.  Neck:  Vascular: No JVD.     Trachea: No tracheal deviation.  Cardiovascular:     Rate and Rhythm: Normal rate and regular rhythm.     Heart sounds: Normal heart sounds. No murmur heard. Pulmonary:     Effort: Pulmonary effort is normal. No tachypnea, accessory muscle usage or respiratory  distress.     Breath sounds: No stridor. No wheezing, rhonchi or rales.     Comments: Diminished breath sounds BL Abdominal:     General: Bowel sounds are normal. There is no distension.     Palpations: Abdomen is soft.     Tenderness: There is no abdominal tenderness.  Musculoskeletal:        General: No tenderness.     Cervical back: Neck supple.  Lymphadenopathy:     Cervical: No cervical adenopathy.  Skin:    General: Skin is warm and dry.     Capillary Refill: Capillary refill takes less than 2 seconds.     Findings: No rash.  Neurological:     Mental Status: She is alert and oriented to person, place, and time.  Psychiatric:        Behavior: Behavior normal.     Vitals:   05/05/21 1612  BP: 122/62  Pulse: (!) 104  Temp: 97.8 F (36.6 C)  TempSrc: Oral  SpO2: 99%  Weight: 117 lb 9.6 oz (53.3 kg)  Height: '5\' 4"'  (1.626 m)   99% on RA BMI Readings from Last 3 Encounters:  05/05/21 20.19 kg/m  05/05/21 20.12 kg/m  04/28/21 19.84 kg/m   Wt Readings from Last 3 Encounters:  05/05/21 117 lb 9.6 oz (53.3 kg)  05/05/21 117 lb 3.2 oz (53.2 kg)  04/28/21 115 lb 9.6 oz (52.4 kg)     CBC    Component Value Date/Time   WBC 7.7 05/05/2021 0828   WBC 2.0 (L) 04/26/2021 0333   RBC 3.39 (L) 05/05/2021 0828   HGB 8.8 (L) 05/05/2021 0828   HGB 11.6 07/03/2019 1153   HCT 27.8 (L) 05/05/2021 0828   HCT 36.0 07/03/2019 1153   PLT 475 (H) 05/05/2021 0828   PLT 291 07/03/2019 1153   MCV 82.0 05/05/2021 0828   MCV 81 07/03/2019 1153   MCH 26.0 05/05/2021 0828   MCHC 31.7 05/05/2021 0828   RDW 18.0 (H) 05/05/2021 0828   RDW 13.2 07/03/2019 1153   LYMPHSABS 0.9 05/05/2021 0828   MONOABS 1.3 (H) 05/05/2021 0828   EOSABS 0.0 05/05/2021 0828   BASOSABS 0.1 05/05/2021 0828     Chest Imaging: CT chest 04/27/2021: Evolving postradiation changes within the right upper lobe lesion, moderate right-sided partially loculated effusion scattered micro nodularity within the  left chest. The patient's images have been independently reviewed by me.    Pulmonary Functions Testing Results: No flowsheet data found.  FeNO:   Pathology:   Echocardiogram:   Heart Catheterization:     Assessment & Plan:     ICD-10-CM   1. Adenocarcinoma of right lung, stage 4 ([facility])  C34.91     2. Subacute cough  R05.2     3. NSCLC with EGFR mutation ([place])  C34.90       Discussion:  This is a 70 year old female, stage IV adenocarcinoma of the lung with concern for progression of disease.  Recently taken for bronchoscopy with repeat tissue sampling there was evidence of giant cells and granulomas in the transbronchial biopsies on the patient's left.  Other tissue on the right was nondiagnostic for malignancy.  She  also had a repeat blood liquid biopsy genetics sent off.  Plan: Recently met with medical oncology discussed various options. Going for a second opinion at [facility] on Monday. From a respiratory standpoint she is doing okay. We talked about airway clearance options to help her in the morning which is seemingly her worst time of day. Continue albuterol as needed  Return to clinic in 3 months or as needed.   Current Outpatient Medications:    acetaminophen (TYLENOL) 500 MG tablet, Take 1,000 mg by mouth every 6 (six) hours as needed (pain)., Disp: , Rfl:    ALPRAZolam (XANAX) 0.25 MG tablet, Take 1 tablet (0.25 mg total) by mouth 3 (three) times daily as needed for anxiety., Disp: 30 tablet, Rfl: 0   amLODipine (NORVASC) 5 MG tablet, Take 5 mg by mouth daily., Disp: , Rfl:    aspirin 81 MG chewable tablet, Chew 1 tablet (81 mg total) by mouth daily., Disp: , Rfl:    b complex vitamins capsule, Take 1 capsule by mouth daily., Disp: , Rfl:    brompheniramine-pseudoephedrine-DM 30-2-10 MG/5ML syrup, Take 5 mLs by mouth 3 (three) times daily as needed., Disp: 120 mL, Rfl: 0   carvedilol (COREG) 12.5 MG tablet, Take 1 tablet (12.5 mg total) by mouth 2 (two) times daily  with a meal., Disp: 180 tablet, Rfl: 3   cetirizine (ZYRTEC) 5 MG tablet, Take 5 mg by mouth daily., Disp: , Rfl:    chlorthalidone (HYGROTON) 25 MG tablet, Take 1 tablet (25 mg total) by mouth daily., Disp: 30 tablet, Rfl: 6   dexamethasone (DECADRON) 1 MG tablet, Take 1 tablet (1 mg total) by mouth 2 (two) times daily as needed (For appetite stimulation)., Disp: 30 tablet, Rfl: 1   dorzolamide-timolol (COSOPT) 22.3-6.8 MG/ML ophthalmic solution, Place 1 drop into both eyes 2 (two) times daily., Disp: , Rfl:    enoxaparin (LOVENOX) 60 MG/0.6ML injection, Inject 0.6 mLs (60 mg total) into the skin every 12 (twelve) hours., Disp: 60 mL, Rfl: 1   esomeprazole (NEXIUM) 20 MG capsule, Take 20 mg by mouth in the morning and at bedtime., Disp: , Rfl:    ezetimibe (ZETIA) 10 MG tablet, Take 10 mg by mouth daily. , Disp: , Rfl:    FLUoxetine (PROZAC) 10 MG capsule, Take 1 capsule (10 mg total) by mouth daily., Disp: 30 capsule, Rfl: 3   folic acid (FOLVITE) 1 MG tablet, Take 1 tablet (1 mg total) by mouth daily., Disp: 30 tablet, Rfl: 4   guaiFENesin (MUCINEX) 600 MG 12 hr tablet, Take 2 tablets (1,200 mg total) by mouth 2 (two) times daily., Disp: 30 tablet, Rfl: 10   ipratropium-albuterol (DUONEB) 0.5-2.5 (3) MG/3ML SOLN, Take 3 mLs by nebulization every 6 (six) hours as needed (Asthma)., Disp: , Rfl:    isosorbide mononitrate (IMDUR) 30 MG 24 hr tablet, Take 1 tablet (30 mg total) by mouth daily., Disp: 90 tablet, Rfl: 3   latanoprost (XALATAN) 0.005 % ophthalmic solution, Place 1 drop into both eyes at bedtime. , Disp: , Rfl:    lidocaine (LIDODERM) 5 %, Place 1 patch onto the skin daily. Remove & Discard patch within 12 hours or as directed by MD, Disp: 30 patch, Rfl: 0   morphine (MS CONTIN) 15 MG 12 hr tablet, Take 1 tablet (15 mg total) by mouth every 12 (twelve) hours., Disp: 60 tablet, Rfl: 0   Nutritional Supplements (,FEEDING SUPPLEMENT, PROSOURCE PLUS) liquid, Take 30 mLs by mouth 2 (two) times  daily between  meals., Disp: 1800 mL, Rfl: 0   osimertinib mesylate (TAGRISSO) 80 MG tablet, Take 1 tablet (80 mg total) by mouth daily., Disp: 30 tablet, Rfl: 3   OVER THE COUNTER MEDICATION, Take 4 drops by mouth daily. Viatmin d, Disp: , Rfl:    oxyCODONE (OXY IR/ROXICODONE) 5 MG immediate release tablet, Take 1 tablet (5 mg total) by mouth every 4 (four) hours as needed for moderate pain., Disp: 30 tablet, Rfl: 0   prochlorperazine (COMPAZINE) 10 MG tablet, Take 1 tablet (10 mg total) by mouth every 6 (six) hours as needed for nausea or vomiting., Disp: 30 tablet, Rfl: 0   REPATHA SURECLICK 794 MG/ML SOAJ, Inject 140 mg into the skin every 14 (fourteen) days., Disp: 6 mL, Rfl: 3   sucralfate (CARAFATE) 1 g tablet, Take 1 tablet (1 g total) by mouth 4 (four) times daily. with meals and at bedtime. Crush and dissolve in 10 mL of warm water prior to swallowing, Disp: 120 tablet, Rfl: 0   potassium chloride SA (KLOR-CON) 20 MEQ tablet, Take 2 tablets (40 mEq total) by mouth daily for 5 doses., Disp: 10 tablet, Rfl: 0    [name], DO [facility] 05/05/2021 4:59 PM

## 2021-05-06 ENCOUNTER — Ambulatory Visit (INDEPENDENT_AMBULATORY_CARE_PROVIDER_SITE_OTHER): Payer: Medicare HMO | Admitting: Pharmacist

## 2021-05-06 VITALS — BP 112/58 | HR 88

## 2021-05-06 DIAGNOSIS — I10 Essential (primary) hypertension: Secondary | ICD-10-CM | POA: Diagnosis not present

## 2021-05-06 NOTE — Progress Notes (Signed)
Patient ID: Allison [name]                 DOB: 1951/01/05                      MRN: [id number]     HPI: Allison [name] is a 70 y.o. female referred by Dr. Gardiner Rhyme to HTN clinic. PMH is significant for lung cancer, CVA, CAD, DVT, HTN, and NSTEMI.    Patient presents today in good spirits. At last visit with PharmD, HCTZ was switched to chlorthalidone.  Patient reports tolerating well. Has lab work updated frequently at oncology.  Brought in blood pressure log: 132/66 104/54 130/62 119/60 104/59 118/73 129/70 143/72 123/69 114/68  Patient would like to see if home machine is accurate.  Checked 2 times in room: 131/64 123/61  Patient reports fatigue however says this may be a result of chemo.  Denies chest pain, SOB.  Has appt with Dr. Gardiner Rhyme in January.  Current HTN meds:  Imdur 30mg  daily Carvedilol 12.5mg  BID Chlorthalidone 25mg  daily Amlodipine 5mg  daily  Previously tried: amlodipine 10mg  (edema) BP goal: <130/80  Wt Readings from Last 3 Encounters:  05/05/21 117 lb 9.6 oz (53.3 kg)  05/05/21 117 lb 3.2 oz (53.2 kg)  04/28/21 115 lb 9.6 oz (52.4 kg)   BP Readings from Last 3 Encounters:  05/05/21 122/62  05/05/21 132/66  04/28/21 130/62   Pulse Readings from Last 3 Encounters:  05/05/21 (!) 104  05/05/21 80  04/28/21 86    Renal function: Estimated Creatinine Clearance: 49.5 mL/min (by C-G formula based on SCr of 0.89 mg/dL).  Past Medical History:  Diagnosis Date   Anemia    Anxiety    Arthritis    Asthma    exercise induced   Depression    PMH   Dyspnea    GERD (gastroesophageal reflux disease)    Glaucoma    History of radiation therapy 01/05/2021   IMRT right lung  11/24/2020-01/05/2021  Dr [name]   Hypertension    lung ca dx'd 11/2017   right   Malignant pleural effusion    right   PONV (postoperative nausea and vomiting)    Pre-diabetes    Raynaud's disease    Raynaud's disease    Stroke ([place]) 01/2021   balance off, some  express aphasia, weakness    Current Outpatient Medications on File Prior to Visit  Medication Sig Dispense Refill   acetaminophen (TYLENOL) 500 MG tablet Take 1,000 mg by mouth every 6 (six) hours as needed (pain).     ALPRAZolam (XANAX) 0.25 MG tablet Take 1 tablet (0.25 mg total) by mouth 3 (three) times daily as needed for anxiety. 30 tablet 0   amLODipine (NORVASC) 5 MG tablet Take 5 mg by mouth daily.     aspirin 81 MG chewable tablet Chew 1 tablet (81 mg total) by mouth daily.     b complex vitamins capsule Take 1 capsule by mouth daily.     brompheniramine-pseudoephedrine-DM 30-2-10 MG/5ML syrup Take 5 mLs by mouth 3 (three) times daily as needed. 120 mL 0   carvedilol (COREG) 12.5 MG tablet Take 1 tablet (12.5 mg total) by mouth 2 (two) times daily with a meal. 180 tablet 3   cetirizine (ZYRTEC) 5 MG tablet Take 5 mg by mouth daily.     chlorthalidone (HYGROTON) 25 MG tablet Take 1 tablet (25 mg total) by mouth daily. 30 tablet 6   dexamethasone (DECADRON) 1  MG tablet Take 1 tablet (1 mg total) by mouth 2 (two) times daily as needed (For appetite stimulation). 30 tablet 1   dorzolamide-timolol (COSOPT) 22.3-6.8 MG/ML ophthalmic solution Place 1 drop into both eyes 2 (two) times daily.     enoxaparin (LOVENOX) 60 MG/0.6ML injection Inject 0.6 mLs (60 mg total) into the skin every 12 (twelve) hours. 60 mL 1   esomeprazole (NEXIUM) 20 MG capsule Take 20 mg by mouth in the morning and at bedtime.     ezetimibe (ZETIA) 10 MG tablet Take 10 mg by mouth daily.      FLUoxetine (PROZAC) 10 MG capsule Take 1 capsule (10 mg total) by mouth daily. 30 capsule 3   folic acid (FOLVITE) 1 MG tablet Take 1 tablet (1 mg total) by mouth daily. 30 tablet 4   guaiFENesin (MUCINEX) 600 MG 12 hr tablet Take 2 tablets (1,200 mg total) by mouth 2 (two) times daily. 30 tablet 10   ipratropium-albuterol (DUONEB) 0.5-2.5 (3) MG/3ML SOLN Take 3 mLs by nebulization every 6 (six) hours as needed (Asthma).      isosorbide mononitrate (IMDUR) 30 MG 24 hr tablet Take 1 tablet (30 mg total) by mouth daily. 90 tablet 3   latanoprost (XALATAN) 0.005 % ophthalmic solution Place 1 drop into both eyes at bedtime.      lidocaine (LIDODERM) 5 % Place 1 patch onto the skin daily. Remove & Discard patch within 12 hours or as directed by MD 30 patch 0   morphine (MS CONTIN) 15 MG 12 hr tablet Take 1 tablet (15 mg total) by mouth every 12 (twelve) hours. 60 tablet 0   Nutritional Supplements (,FEEDING SUPPLEMENT, PROSOURCE PLUS) liquid Take 30 mLs by mouth 2 (two) times daily between meals. 1800 mL 0   osimertinib mesylate (TAGRISSO) 80 MG tablet Take 1 tablet (80 mg total) by mouth daily. 30 tablet 3   OVER THE COUNTER MEDICATION Take 4 drops by mouth daily. Viatmin d     oxyCODONE (OXY IR/ROXICODONE) 5 MG immediate release tablet Take 1 tablet (5 mg total) by mouth every 4 (four) hours as needed for moderate pain. 30 tablet 0   potassium chloride SA (KLOR-CON) 20 MEQ tablet Take 2 tablets (40 mEq total) by mouth daily for 5 doses. 10 tablet 0   prochlorperazine (COMPAZINE) 10 MG tablet Take 1 tablet (10 mg total) by mouth every 6 (six) hours as needed for nausea or vomiting. 30 tablet 0   REPATHA SURECLICK 195 MG/ML SOAJ Inject 140 mg into the skin every 14 (fourteen) days. 6 mL 3   sucralfate (CARAFATE) 1 g tablet Take 1 tablet (1 g total) by mouth 4 (four) times daily. with meals and at bedtime. Crush and dissolve in 10 mL of warm water prior to swallowing 120 tablet 0   No current facility-administered medications on file prior to visit.    Allergies  Allergen Reactions   Penicillins Other (See Comments)    SYNCOPE PATIENT HAS HAD A PCN REACTION WITH IMMEDIATE RASH, FACIAL/TONGUE/THROAT SWELLING, SOB, OR LIGHTHEADEDNESS WITH HYPOTENSION:  #  #  YES  #  # Has patient had a PCN reaction causing severe rash involving mucus membranes or skin necrosis: No Has patient had a PCN reaction that required  hospitalization: No Has patient had a PCN reaction occurring within the last 10 years: No    Vicodin [Hydrocodone-Acetaminophen] Other (See Comments)    Sped up heart and breathing   Other Other (See Comments)  Glaucoma eye drop - turned eyes dark     Assessment/Plan:  1. Hypertension -  Patient BP in room 112/58 which is at goal of <130/80 and patient feels well.  Home readings also mostly at goal and home cuff appears accurate.  No medication changes needed at this time.  Continue: Amlodipine 5mg  daily Chlorthalidone 25mg  daily Carvedilol 12.5mg  BID Isosorbide 30mg  daily Recheck as needed  Karren Cobble, PharmD, [place], [place], [place], [place], [place], [address] Phone: [phone], Fax: [phone]

## 2021-05-06 NOTE — Patient Instructions (Signed)
It was good seeing you this morning  Your blood pressure today is excellent  We would like it to stay less than 130/80  Continue your amlodipine 5mg  daily, chlorthalidone 25mg  daily, and carvedilol 12.5mg  daily  Continue to check your blood pressure at home   Please call with any questions!  Karren Cobble, PharmD, BCACP, [place], [place], [place], [place], [address] Phone: [phone], Fax: [phone]

## 2021-05-09 DIAGNOSIS — C3411 Malignant neoplasm of upper lobe, right bronchus or lung: Secondary | ICD-10-CM | POA: Diagnosis not present

## 2021-05-09 DIAGNOSIS — C3490 Malignant neoplasm of unspecified part of unspecified bronchus or lung: Secondary | ICD-10-CM | POA: Diagnosis not present

## 2021-05-10 ENCOUNTER — Encounter: Payer: Self-pay | Admitting: Internal Medicine

## 2021-05-10 DIAGNOSIS — J939 Pneumothorax, unspecified: Secondary | ICD-10-CM | POA: Diagnosis not present

## 2021-05-10 DIAGNOSIS — C3491 Malignant neoplasm of unspecified part of right bronchus or lung: Secondary | ICD-10-CM | POA: Diagnosis not present

## 2021-05-13 ENCOUNTER — Encounter: Payer: Self-pay | Admitting: Internal Medicine

## 2021-05-19 ENCOUNTER — Ambulatory Visit (INDEPENDENT_AMBULATORY_CARE_PROVIDER_SITE_OTHER): Payer: Medicare HMO | Admitting: Psychologist

## 2021-05-19 DIAGNOSIS — F320 Major depressive disorder, single episode, mild: Secondary | ICD-10-CM

## 2021-05-19 DIAGNOSIS — R69 Illness, unspecified: Secondary | ICD-10-CM | POA: Diagnosis not present

## 2021-05-24 ENCOUNTER — Encounter: Payer: Self-pay | Admitting: Internal Medicine

## 2021-05-24 LAB — FUNGAL ORGANISM REFLEX

## 2021-05-24 LAB — FUNGUS CULTURE WITH STAIN

## 2021-05-24 LAB — FUNGUS CULTURE RESULT

## 2021-05-24 NOTE — Progress Notes (Deleted)
[facility]EMAT[facility] TeleHEALTH VISIT PROGRESS NOTE   I connected with Allison [name] on 05/24/21 at 10:30 AM EST by telephone and verified that I am speaking with the correct person using two identifiers.  I discussed the limitations, risks, security and privacy concerns of performing an evaluation and management service by telemedicine and the availability of in-person appointments. I also discussed with the patient that there may be a patient responsible charge related to this service. The patient expressed understanding and agreed to proceed.  Other persons participating in the visit and their role in the encounter: ***  Patient's location: ***  Provider's location: [name], [place] [address]  DIAGNOSIS: Stage IV (T2 a,N2, M1a) non-small cell lung cancer, adenocarcinoma diagnosed in July 2019 and presented with right upper lobe lung mass in addition to mediastinal lymphadenopathy as well as bilateral pulmonary nodules and malignant right pleural effusion.   Biomarker Findings Microsatellite status - Cannot Be Determined Tumor Mutational Burden - Cannot Be Determined Genomic Findings For a complete list of the genes assayed, please refer to the Appendix. EGFR exon 19 deletion ([id number]) TP53 Y220C 7 Disease relevant genes with no reportable alterations: KRAS, ALK, BRAF, MET, RET, ERBB2, ROS1   PRIOR THERAPY:  1) Status post right Pleurx catheter placement by Dr. Prescott Gum for drainage of malignant right pleural effusion. 2) palliative radiotherapy to the enlarging right upper lobe lung mass and mediastinum under the care of Dr. Sondra Come expected to be completed on January 05, 2021. 3) Tagrisso 80 mg p.o. daily.  First dose was given on 01/29/2018.  Status post 37 months of treatment.  CURRENT THERAPY: Systemic chemotherapy with carboplatin for AUC of 5 and Alimta 500 Mg/M2 every 3 weeks.  First dose 03/17/2021.   The patient will also continue her current treatment with Tagrisso 80 mg p.o. daily.  She is status post 3 cycles.  INTERVAL HISTORY: Allison [name] 70 y.o. female returns via virtual visit to the clinic today.  The patient was last seen in the clinic by Dr. Julien Nordmann on 04/28/2021.  The patient underwent a bronchoscopy under the care of Dr. Valeta Harms on 04/21/2021.  She had a follow-up with him recently to review the pathology results which was negative for malignancy on the left and the right biopsy.  Patient also followed up with palliative care who increased her Xanax at bedtime dose due to her anxiety.  They also recommended Mucinex and humidifier.  They also discussed protein smoothies and shakes.  For pain control, she takes Tylenol and oxycodone and they discussed bowel prophylaxis.  She saw Dr. Durenda Hurt from [facility] for second opinion on 05/09/2021.  He explained the rationale behind her current treatment.  She is not a good candidate for Avastin due to her recent stroke in August 2022. He discussed other treatment options which may be available in 2023 including patritumab deruxtcan and lazertinib/amivantmab and this may be available in 2023.  If these are not available, he wrote that they can determine if they can get the agents through an expanded access, single patient IND, or clinical trial.   Since last being seen, the patient has been feeling ***.  Regarding her breathing, she continues to have significant shortness of breath with minimal exertion which is***to her last appointment.  She sometimes has chest pressure for which she takes a Tylenol and oxycodone if needed.  She also has nebulizer treatments. Of note, she also has  a loculated right pleural effusion..   The patient has also been having some trouble with decreased appetite and weight loss.  She is followed by member the nutritionist team.  She is expected to see them again on 04/28/2021.  At her last appointment, she was  prescribed Marinol to help with her appetite. 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  She had some drug to drug interactions with Remeron; therefore, she was not a good candidate for this.  She did recently start taking Marinol and notes that it does make her sleepy.  He lost approximately 1 pound since her last appointment.  She denies any fevers  or chills.  He had some nausea this morning. Constipation? She is for the lung cancer support group which she enjoys tremendously. She is here today for evaluation and repeat blood work before considering starting starting cycle #4.    MEDICAL HISTORY: Past Medical History:  Diagnosis Date   Anemia    Anxiety    Arthritis    Asthma    exercise induced   Depression    PMH   Dyspnea    GERD (gastroesophageal reflux disease)    Glaucoma    History of radiation therapy 01/05/2021   IMRT right lung  11/24/2020-01/05/2021  Dr [name]   Hypertension    lung ca dx'd 11/2017   right   Malignant pleural effusion    right   PONV (postoperative nausea and vomiting)    Pre-diabetes    Raynaud's disease    Raynaud's disease    Stroke ([place]) 01/2021   balance off, some express aphasia, weakness    ALLERGIES:  is allergic to penicillins, vicodin [hydrocodone-acetaminophen], and other.  MEDICATIONS:  Current Outpatient Medications  Medication Sig Dispense Refill   acetaminophen (TYLENOL) 500 MG tablet Take 1,000 mg by mouth every 6 (six) hours as needed (pain).     ALPRAZolam (XANAX) 0.25 MG tablet Take 1 tablet (0.25 mg total) by mouth 3 (three) times daily as needed for anxiety. 30 tablet 0   amLODipine (NORVASC) 5 MG tablet Take 5 mg by mouth daily.     aspirin 81 MG chewable tablet Chew 1 tablet (81 mg total) by mouth daily.     b complex vitamins capsule Take 1 capsule by mouth daily.     brompheniramine-pseudoephedrine-DM 30-2-10 MG/5ML syrup Take 5 mLs by mouth 3 (three) times daily as needed. 120 mL 0   carvedilol  (COREG) 12.5 MG tablet Take 1 tablet (12.5 mg total) by mouth 2 (two) times daily with a meal. 180 tablet 3   cetirizine (ZYRTEC) 5 MG tablet Take 5 mg by mouth daily.     chlorthalidone (HYGROTON) 25 MG tablet Take 1 tablet (25 mg total) by mouth daily. 30 tablet 6   dexamethasone (DECADRON) 1 MG tablet Take 1 tablet (1 mg total) by mouth 2 (two) times daily as needed (For appetite stimulation). 30 tablet 1   dorzolamide-timolol (COSOPT) 22.3-6.8 MG/ML ophthalmic solution Place 1 drop into both eyes 2 (two) times daily.     enoxaparin (LOVENOX) 60 MG/0.6ML injection Inject 0.6 mLs (60 mg total) into the skin every 12 (twelve) hours. 60 mL 1   esomeprazole (NEXIUM) 20 MG capsule Take 20 mg by mouth in the morning and at bedtime.     ezetimibe (ZETIA) 10 MG tablet Take 10 mg by mouth daily.      FLUoxetine (PROZAC) 10 MG capsule Take 1 capsule (10 mg  total) by mouth daily. 30 capsule 3   folic acid (FOLVITE) 1 MG tablet Take 1 tablet (1 mg total) by mouth daily. 30 tablet 4   guaiFENesin (MUCINEX) 600 MG 12 hr tablet Take 2 tablets (1,200 mg total) by mouth 2 (two) times daily. 30 tablet 10   ipratropium-albuterol (DUONEB) 0.5-2.5 (3) MG/3ML SOLN Take 3 mLs by nebulization every 6 (six) hours as needed (Asthma).     isosorbide mononitrate (IMDUR) 30 MG 24 hr tablet Take 1 tablet (30 mg total) by mouth daily. 90 tablet 3   latanoprost (XALATAN) 0.005 % ophthalmic solution Place 1 drop into both eyes at bedtime.      lidocaine (LIDODERM) 5 % Place 1 patch onto the skin daily. Remove & Discard patch within 12 hours or as directed by MD 30 patch 0   morphine (MS CONTIN) 15 MG 12 hr tablet Take 1 tablet (15 mg total) by mouth every 12 (twelve) hours. 60 tablet 0   Nutritional Supplements (,FEEDING SUPPLEMENT, PROSOURCE PLUS) liquid Take 30 mLs by mouth 2 (two) times daily between meals. 1800 mL 0   osimertinib mesylate (TAGRISSO) 80 MG tablet Take 1 tablet (80 mg total) by mouth daily. 30 tablet 3   OVER  THE COUNTER MEDICATION Take 4 drops by mouth daily. Viatmin d     oxyCODONE (OXY IR/ROXICODONE) 5 MG immediate release tablet Take 1 tablet (5 mg total) by mouth every 4 (four) hours as needed for moderate pain. 30 tablet 0   potassium chloride SA (KLOR-CON) 20 MEQ tablet Take 2 tablets (40 mEq total) by mouth daily for 5 doses. 10 tablet 0   prochlorperazine (COMPAZINE) 10 MG tablet Take 1 tablet (10 mg total) by mouth every 6 (six) hours as needed for nausea or vomiting. 30 tablet 0   REPATHA SURECLICK 704 MG/ML SOAJ Inject 140 mg into the skin every 14 (fourteen) days. 6 mL 3   sucralfate (CARAFATE) 1 g tablet Take 1 tablet (1 g total) by mouth 4 (four) times daily. with meals and at bedtime. Crush and dissolve in 10 mL of warm water prior to swallowing 120 tablet 0   No current facility-administered medications for this visit.    SURGICAL HISTORY:  Past Surgical History:  Procedure Laterality Date   ABDOMINAL HYSTERECTOMY     partial   BRONCHIAL BIOPSY  04/21/2021   Procedure: BRONCHIAL BIOPSIES;  Surgeon: [name], DO;  Location: [facility];  Service: Pulmonary;;   BRONCHIAL BRUSHINGS  04/21/2021   Procedure: BRONCHIAL BRUSHINGS;  Surgeon: [name], DO;  Location: [facility];  Service: Pulmonary;;   BRONCHIAL NEEDLE ASPIRATION BIOPSY  04/21/2021   Procedure: BRONCHIAL NEEDLE ASPIRATION BIOPSIES;  Surgeon: [name], DO;  Location: [facility];  Service: Pulmonary;;   CHEST TUBE INSERTION Right 01/01/2018   Procedure: INSERTION PLEURAL DRAINAGE CATHETER;  Surgeon: Ivin Poot, MD;  Location: [facility] OR;  Service: Thoracic;  Laterality: Right;   CHEST TUBE INSERTION  04/21/2021   Procedure: CHEST TUBE INSERTION;  Surgeon: [name], DO;  Location: [facility]COPY;  Service: Pulmonary;;   COLONOSCOPY     CORONARY STENT INTERVENTION N/A 06/16/2019   Procedure: CORONARY STENT INTERVENTION;  Surgeon: Jettie Booze, MD;  Location: [facility];   Service: Cardiovascular;  Laterality: N/A;   DILATION AND CURETTAGE OF UTERUS     EYE SURGERY     due to Glaucoma   IR IMAGING GUIDED PORT INSERTION  03/22/2021   IR  PORT REPAIR CENTRAL VENOUS ACCESS DEVICE  04/15/2021   LEFT HEART CATH AND CORONARY ANGIOGRAPHY N/A 06/16/2019   Procedure: LEFT HEART CATH AND CORONARY ANGIOGRAPHY;  Surgeon: Jettie Booze, MD;  Location: [facility];  Service: Cardiovascular;  Laterality: N/A;   REMOVAL OF PLEURAL DRAINAGE CATHETER Right 11/07/2018   Procedure: REMOVAL OF PLEURAL DRAINAGE CATHETER;  Surgeon: Ivin Poot, MD;  Location: [facility];  Service: Thoracic;  Laterality: Right;   ROTATOR CUFF REPAIR     TUBAL LIGATION     VIDEO BRONCHOSCOPY WITH ENDOBRONCHIAL NAVIGATION Bilateral 04/21/2021   Procedure: VIDEO BRONCHOSCOPY WITH ENDOBRONCHIAL NAVIGATION;  Surgeon: [name], DO;  Location: [facility];  Service: Pulmonary;  Laterality: Bilateral;  ION   WISDOM TOOTH EXTRACTION      REVIEW OF SYSTEMS:   Review of Systems  Constitutional: Negative for appetite change, chills, fatigue, fever and unexpected weight change.  HENT:   Negative for mouth sores, nosebleeds, sore throat and trouble swallowing.   Eyes: Negative for eye problems and icterus.  Respiratory: Negative for cough, hemoptysis, shortness of breath and wheezing.   Cardiovascular: Negative for chest pain and leg swelling.  Gastrointestinal: Negative for abdominal pain, constipation, diarrhea, nausea and vomiting.  Genitourinary: Negative for bladder incontinence, difficulty urinating, dysuria, frequency and hematuria.   Musculoskeletal: Negative for back pain, gait problem, neck pain and neck stiffness.  Skin: Negative for itching and rash.  Neurological: Negative for dizziness, extremity weakness, gait problem, headaches, light-headedness and seizures.  Hematological: Negative for adenopathy. Does not bruise/bleed easily.  Psychiatric/Behavioral: Negative for  confusion, depression and sleep disturbance. The patient is not nervous/anxious.     PHYSICAL EXAMINATION:  There were no vitals taken for this visit.  ECOG PERFORMANCE STATUS: {CHL ONC ECOG [id number]  Physical Exam  Constitutional: Oriented to person, place, and time and well-developed, well-nourished, and in no distress. No distress.  HENT:  Head: Normocephalic and atraumatic.  Mouth/Throat: Oropharynx is clear and moist. No oropharyngeal exudate.  Eyes: Conjunctivae are normal. Right eye exhibits no discharge. Left eye exhibits no discharge. No scleral icterus.  Neck: Normal range of motion. Neck supple.  Cardiovascular: Normal rate, regular rhythm, normal heart sounds and intact distal pulses.   Pulmonary/Chest: Effort normal and breath sounds normal. No respiratory distress. No wheezes. No rales.  Abdominal: Soft. Bowel sounds are normal. Exhibits no distension and no mass. There is no tenderness.  Musculoskeletal: Normal range of motion. Exhibits no edema.  Lymphadenopathy:    No cervical adenopathy.  Neurological: Alert and oriented to person, place, and time. Exhibits normal muscle tone. Gait normal. Coordination normal.  Skin: Skin is warm and dry. No rash noted. Not diaphoretic. No erythema. No pallor.  Psychiatric: Mood, memory and judgment normal.  Vitals reviewed.  LABORATORY DATA: Lab Results  Component Value Date   WBC 7.7 05/05/2021   HGB 8.8 (L) 05/05/2021   HCT 27.8 (L) 05/05/2021   MCV 82.0 05/05/2021   PLT 475 (H) 05/05/2021      Chemistry      Component Value Date/Time   NA 136 05/05/2021 0828   NA 140 03/04/2021 1516   K 3.3 (L) 05/05/2021 0828   CL 99 05/05/2021 0828   CO2 28 05/05/2021 0828   BUN 13 05/05/2021 0828   BUN 21 03/04/2021 1516   CREATININE 0.89 05/05/2021 0828      Component Value Date/Time   CALCIUM 9.8 05/05/2021 0828   ALKPHOS 54 05/05/2021 0828   AST 19 05/05/2021  0828   ALT 16 05/05/2021 0828   BILITOT 0.2 (L)  05/05/2021 0828       RADIOGRAPHIC STUDIES:  CT Chest W Contrast  Result Date: 04/27/2021 CLINICAL DATA:  70 year old female with history of non-small cell lung cancer. Status post radiation therapy and chemotherapy (radiation therapy completed in late July 2022). Evaluate for treatment response. EXAM: CT CHEST WITH CONTRAST TECHNIQUE: Multidetector CT imaging of the chest was performed during intravenous contrast administration. CONTRAST:  58m OMNIPAQUE IOHEXOL 300 MG/ML  SOLN COMPARISON:  Chest CT 03/04/2021. FINDINGS: Cardiovascular: Heart size is normal. There is no significant pericardial fluid, thickening or pericardial calcification. There is aortic atherosclerosis, as well as atherosclerosis of the great vessels of the mediastinum and the coronary arteries, including calcified atherosclerotic plaque in the left anterior descending and right coronary arteries. Right internal jugular single-lumen porta cath with tip terminating in the right atrium. Mediastinum/Nodes: There is amorphous soft tissue in the subcarinal region, without discrete enlarged lymph node, very similar to the prior study. Esophagus is unremarkable in appearance. No axillary lymphadenopathy. Lungs/Pleura: Trace residual left-sided pneumothorax best noted medially (axial image 48 of series 4), of doubtful clinical significance. Treated right upper lobe neoplasm near the apex currently measures 2.5 x 1.7 cm (axial image 31 of series 4), previously 2.7 x 1.7 cm on 03/04/2021. This is surrounded by extensive consolidation, with patchy areas of septal thickening, ground-glass attenuation and thickening of the peribronchovascular interstitium noted in the adjacent portions of the right upper lung, overall increased compared to the recent prior examination, likely to reflect evolving postradiation changes of resolving pneumonitis and developing fibrosis. Widespread areas of septal thickening and micronodularity seen throughout the left  lung on the prior study have nearly completely regressed. Moderate right pleural effusion, partially loculated in the right major fissure, similar to the prior study. Upper Abdomen: Unremarkable. Musculoskeletal: Small amount of gas in the left chest wall, presumably related to recently removed chest tube. There are no aggressive appearing lytic or blastic lesions noted in the visualized portions of the skeleton. IMPRESSION: 1. Today's study demonstrates evolving postradiation changes in the lungs, likely with resolving postradiation pneumonitis and developing postradiation fibrosis. Treated right upper lobe lesion is stable in size. 2. Moderate right partially loculated pleural effusion, similar to the prior examination. 3. Aortic atherosclerosis, in addition to two vessel coronary artery disease. Please note that although the presence of coronary artery calcium documents the presence of coronary artery disease, the severity of this disease and any potential stenosis cannot be assessed on this non-gated CT examination. Assessment for potential risk factor modification, dietary therapy or pharmacologic therapy may be warranted, if clinically indicated. Aortic Atherosclerosis (ICD10-I70.0). Electronically Signed   By: DVinnie LangtonM.D.   On: 04/27/2021 14:42   MR BRAIN W WO CONTRAST  Result Date: 05/05/2021 [facility] NEUROIMAGING REPORT STUDY DATE: 05/03/21 PATIENT NAME: Allison DEARMANDOB: 8May 07, 1952MRN: [id number] CLINICIAN: MFrann Rider NP CLINICAL HISTORY: 70year old female with strokes. Follow up study. EXAM: MR BRAIN W WO CONTRAST TECHNIQUE: MRI of the brain with and without contrast was obtained utilizing 5 mm axial slices with T1, T2, T2 flair, SWI and diffusion weighted views.  T1 sagittal, T2 coronal and postcontrast views in the axial and coronal plane were obtained. CONTRAST: 17mmultihance COMPARISON: 02/27/21 [facility] [address])  FINDINGS: No abnormal lesions are seen on diffusion-weighted views to suggest acute ischemia. The cortical sulci, fissures  and cisterns are normal in size and appearance. Lateral, third and fourth ventricle are normal in size and appearance. No extra-axial fluid collections are seen. No evidence of mass effect or midline shift.  Moderate periventricular, subcortical, juxtacortical and cerebellar chronic small vessel ischemic disease. No abnormal lesions are seen on post contrast views.  On sagittal views the posterior fossa, pituitary gland and corpus callosum are notable for partially empty sella.  Mild dystrophic mineralization versus hemosiderin deposits within the right cerebellum, left occipital and right posterior frontal regions.  No other evidence of intracranial hemorrhage on SWI views. The orbits and their contents, paranasal sinuses and calvarium are unremarkable.  Intracranial flow voids are present.   MRI brain with and without contrast demonstrating: -Moderate periventricular, subcortical, juxtacortical, and cerebellar chronic small vessel ischemic disease. -Chronic left occipital cortical infarctions. -No abnormal enhancing lesions.  Previously noted areas of enhancement likely represented subacute infarcts and cortical laminar necrosis. INTERPRETING PHYSICIAN: Penni Bombard, MD Certified in Neurology, Neurophysiology and Neuroimaging [facility] [address] [place], [place] [address] [phone]   DG CHEST PORT 1 VIEW  Result Date: 04/26/2021 CLINICAL DATA:  Pneumothorax EXAM: PORTABLE CHEST 1 VIEW COMPARISON:  Previous studies including the examination of 04/25/2021 FINDINGS: Transverse diameter of heart is in the upper limits of normal. There is prominence of superior mediastinum with no significant change. Increased markings in the right apical region have not changed. There is homogeneous opacity in the right mid and right lower  lung fields which may be due to loculated pleural effusion and possibly underlying atelectasis/pneumonia. There is crowding of markings in the medial left lower lung fields. Rest of the left lung is unremarkable. Left lateral CP angle is clear. There is decrease in size of minimal residual left pneumothorax along the lateral margin of left upper lung fields. IMPRESSION: There is decrease in size of small left pneumothorax. No significant interval changes are noted in the lung fields. Electronically Signed   By: Elmer Picker M.D.   On: 04/26/2021 08:42   DG CHEST PORT 1 VIEW  Result Date: 04/25/2021 CLINICAL DATA:  Follow-up pneumothorax EXAM: PORTABLE CHEST 1 VIEW COMPARISON:  04/25/2021, CT 04/03/2021, radiograph 04/21/2021 FINDINGS: Interim removal of left-sided chest tube. Residual small apicolateral pneumothorax without significant change since yesterday's radiograph. Right-sided central venous port tip over the SVC. Right pleural effusion and airspace opacities are unchanged. Normal cardiomediastinal silhouette. IMPRESSION: 1. Removal of left chest tube with similar size of small residual left apicolateral pneumothorax 2. Otherwise no significant change as compared with yesterday's radiograph. Electronically Signed   By: Donavan Foil M.D.   On: 04/25/2021 17:16   DG CHEST PORT 1 VIEW  Result Date: 04/25/2021 CLINICAL DATA:  Chest tube, pneumothorax EXAM: PORTABLE CHEST 1 VIEW COMPARISON:  Chest x-ray 04/24/2021 FINDINGS: Left-sided chest tube in stable position. Stable small left pneumothorax. Cardiomediastinal silhouette is unchanged. Right-sided central venous port with the tip in the SVC. Stable prominent interstitial lung markings. Grossly stable dense opacities in the right upper and lower lung zones and pleural effusion along the peripheral right lung. IMPRESSION: No significant change since previous study. Stable small left pneumothorax. Electronically Signed   By: Ofilia Neas M.D.    On: 04/25/2021 08:09   DG CHEST PORT 1 VIEW  Result Date: 04/24/2021 CLINICAL DATA:  Pleuritic pain.  Left chest pain. EXAM: PORTABLE CHEST 1 VIEW COMPARISON:  April 24, 2021 FINDINGS: The left chest tube is stable. The previously identified left pneumothorax is  much smaller in the interval with only a tiny pneumothorax remaining. Cardiomediastinal silhouette is stable. The right Port-A-Cath is stable. Stable right pulmonary infiltrates. IMPRESSION: 1. Stable right pulmonary infiltrates. 2. Stable left chest tube. A tiny left pneumothorax remains, smaller in the interval. Electronically Signed   By: Dorise Bullion III M.D.   On: 04/24/2021 18:25     ASSESSMENT/PLAN:  This is a very pleasant 70 year old never smoker African-American female diagnosed with stage IV non-small cell lung cancer, adenocarcinoma.  She was positive for an EGFR mutation with deletion in exon 19.  She was diagnosed in July 2019 and presented with right upper lobe lung mass in addition to mediastinal lymphadenopathy as well as bilateral pulmonary nodules and malignant right pleural effusion.   The patient was started on treatment with Tagrisso 80 mg p.o. daily status post 37 months of treatment.   In April 2022, she showed evidence of disease progression with interval progression of the right apical lung mass in addition to progression of mediastinal lymphadenopathy concerning for worsening of her disease. She had repeat Guardant 360 molecular studies which did not show evidence of new resistant mutations.  Therefore, she was referred to radiation oncology and completed palliative radiotherapy to the enlarging right upper lobe lung mass and mediastinum under the care of Dr. Sondra Come.  This was completed in July 2022.   Unfortunately, the patient recently was found to have evidence of disease progression.  Therefore, Dr. Julien Nordmann started the patient on systemic chemotherapy with carboplatin for AUC of 5 and Alimta 500 mg per  metered squared.  She is status post 3 cycles and she tolerated it fairly well despite her ongoing issues with fatigue, chest tightness, cough, and weight loss.  She is not a good candidate for Avastin due to her recent CVA.  She also is continuing to take Tagrisso as it is protective against progressive metastatic disease to the brain.  The patient underwent a repeat bronchoscopy and biopsy for repeat molecular testing.  The sample from the left and right lung biopsy was negative for malignancy.   The patient saw Dr. Durenda Hurt from [facility] for second opinion.  They discussed ***   The patient was seen with Dr. Julien Nordmann today.  Labs reviewed.  Recommend that she ***with cycle #4 today as scheduled.  We will see her back for follow-up visit in 3 weeks for evaluation before undergoing cycle #5.  Lovenox.   Marinol  Mucinex and humidifier and cough medication   Oxycodone for pain.   I discussed the assessment and treatment plan with the patient. The patient was provided an opportunity to ask questions and all were answered. The patient agreed with the plan and demonstrated an understanding of the instructions.  The patient was advised to call back or seek an in-person evaluation if the symptoms worsen or if the condition fails to improve as anticipated.  I provided *** minutes of {Blank single:19197::"face-to-face video visit time","non face-to-face telephone visit time"} during this encounter, and > 50% was spent counseling as documented under my assessment & plan.  Allison Fitterer L Tomaz Janis, Allison [name] 05/24/2021 8:35 AM  No orders of the defined types were placed in this encounter.    Allison Furlough L Jacari Kirsten, Allison [name] 05/24/21

## 2021-05-25 ENCOUNTER — Encounter: Payer: Self-pay | Admitting: Internal Medicine

## 2021-05-25 ENCOUNTER — Other Ambulatory Visit: Payer: Self-pay | Admitting: Nurse Practitioner

## 2021-05-25 MED ORDER — ESOMEPRAZOLE MAGNESIUM 20 MG PO CPDR: 20.0000 mg | DELAYED_RELEASE_CAPSULE | Freq: Two times a day (BID) | ORAL | 2 refills | Status: DC

## 2021-05-25 NOTE — Telephone Encounter (Signed)
I spoke with pt directly and advised I have moved her video visit to 8am (before her infusion and after her lab appt). I also advised the pt her nutrition appt is a phone appt. Pt expressed understanding of this information.

## 2021-05-26 ENCOUNTER — Inpatient Hospital Stay: Payer: Medicare HMO

## 2021-05-26 ENCOUNTER — Inpatient Hospital Stay: Payer: Medicare HMO | Admitting: Physician Assistant

## 2021-05-26 ENCOUNTER — Telehealth: Payer: Self-pay | Admitting: Nutrition

## 2021-05-26 ENCOUNTER — Inpatient Hospital Stay: Payer: Medicare HMO | Attending: Internal Medicine | Admitting: Nutrition

## 2021-05-26 ENCOUNTER — Inpatient Hospital Stay (HOSPITAL_BASED_OUTPATIENT_CLINIC_OR_DEPARTMENT_OTHER): Payer: Medicare HMO | Admitting: Physician Assistant

## 2021-05-26 ENCOUNTER — Other Ambulatory Visit: Payer: Self-pay

## 2021-05-26 VITALS — BP 130/68 | HR 67 | Temp 98.0°F | Resp 20 | Wt 118.6 lb

## 2021-05-26 DIAGNOSIS — F32A Depression, unspecified: Secondary | ICD-10-CM | POA: Insufficient documentation

## 2021-05-26 DIAGNOSIS — I2510 Atherosclerotic heart disease of native coronary artery without angina pectoris: Secondary | ICD-10-CM | POA: Diagnosis not present

## 2021-05-26 DIAGNOSIS — C3491 Malignant neoplasm of unspecified part of right bronchus or lung: Secondary | ICD-10-CM

## 2021-05-26 DIAGNOSIS — C3411 Malignant neoplasm of upper lobe, right bronchus or lung: Secondary | ICD-10-CM | POA: Insufficient documentation

## 2021-05-26 DIAGNOSIS — R5383 Other fatigue: Secondary | ICD-10-CM | POA: Diagnosis not present

## 2021-05-26 DIAGNOSIS — Z8673 Personal history of transient ischemic attack (TIA), and cerebral infarction without residual deficits: Secondary | ICD-10-CM | POA: Insufficient documentation

## 2021-05-26 DIAGNOSIS — Z7901 Long term (current) use of anticoagulants: Secondary | ICD-10-CM | POA: Diagnosis not present

## 2021-05-26 DIAGNOSIS — I10 Essential (primary) hypertension: Secondary | ICD-10-CM | POA: Diagnosis not present

## 2021-05-26 DIAGNOSIS — R69 Illness, unspecified: Secondary | ICD-10-CM | POA: Diagnosis not present

## 2021-05-26 DIAGNOSIS — Z86711 Personal history of pulmonary embolism: Secondary | ICD-10-CM | POA: Diagnosis not present

## 2021-05-26 DIAGNOSIS — Z5111 Encounter for antineoplastic chemotherapy: Secondary | ICD-10-CM

## 2021-05-26 DIAGNOSIS — K219 Gastro-esophageal reflux disease without esophagitis: Secondary | ICD-10-CM | POA: Diagnosis not present

## 2021-05-26 DIAGNOSIS — Z923 Personal history of irradiation: Secondary | ICD-10-CM | POA: Diagnosis not present

## 2021-05-26 DIAGNOSIS — J45909 Unspecified asthma, uncomplicated: Secondary | ICD-10-CM | POA: Insufficient documentation

## 2021-05-26 DIAGNOSIS — Z86718 Personal history of other venous thrombosis and embolism: Secondary | ICD-10-CM | POA: Diagnosis not present

## 2021-05-26 DIAGNOSIS — E876 Hypokalemia: Secondary | ICD-10-CM | POA: Diagnosis not present

## 2021-05-26 DIAGNOSIS — F419 Anxiety disorder, unspecified: Secondary | ICD-10-CM | POA: Diagnosis not present

## 2021-05-26 DIAGNOSIS — Z79899 Other long term (current) drug therapy: Secondary | ICD-10-CM | POA: Diagnosis not present

## 2021-05-26 LAB — CBC WITH DIFFERENTIAL (CANCER CENTER ONLY)
Abs Immature Granulocytes: 0.02 10*3/uL (ref 0.00–0.07)
Basophils Absolute: 0 10*3/uL (ref 0.0–0.1)
Basophils Relative: 1 %
Eosinophils Absolute: 0 10*3/uL (ref 0.0–0.5)
Eosinophils Relative: 0 %
HCT: 28 % — ABNORMAL LOW (ref 36.0–46.0)
Hemoglobin: 8.8 g/dL — ABNORMAL LOW (ref 12.0–15.0)
Immature Granulocytes: 1 %
Lymphocytes Relative: 21 %
Lymphs Abs: 0.7 10*3/uL (ref 0.7–4.0)
MCH: 26.7 pg (ref 26.0–34.0)
MCHC: 31.4 g/dL (ref 30.0–36.0)
MCV: 85.1 fL (ref 80.0–100.0)
Monocytes Absolute: 0.5 10*3/uL (ref 0.1–1.0)
Monocytes Relative: 18 %
Neutro Abs: 1.8 10*3/uL (ref 1.7–7.7)
Neutrophils Relative %: 59 %
Platelet Count: 291 10*3/uL (ref 150–400)
RBC: 3.29 MIL/uL — ABNORMAL LOW (ref 3.87–5.11)
RDW: 19.9 % — ABNORMAL HIGH (ref 11.5–15.5)
WBC Count: 3.1 10*3/uL — ABNORMAL LOW (ref 4.0–10.5)
nRBC: 0 % (ref 0.0–0.2)

## 2021-05-26 LAB — CMP (CANCER CENTER ONLY)
ALT: 20 U/L (ref 0–44)
AST: 20 U/L (ref 15–41)
Albumin: 3.8 g/dL (ref 3.5–5.0)
Alkaline Phosphatase: 59 U/L (ref 38–126)
Anion gap: 10 (ref 5–15)
BUN: 19 mg/dL (ref 8–23)
CO2: 28 mmol/L (ref 22–32)
Calcium: 10 mg/dL (ref 8.9–10.3)
Chloride: 100 mmol/L (ref 98–111)
Creatinine: 0.88 mg/dL (ref 0.44–1.00)
GFR, Estimated: >60 mL/min (ref >60)
Glucose, Bld: 119 mg/dL — ABNORMAL HIGH (ref 70–99)
Potassium: 3.2 mmol/L — ABNORMAL LOW (ref 3.5–5.1)
Sodium: 138 mmol/L (ref 135–145)
Total Bilirubin: 0.2 mg/dL — ABNORMAL LOW (ref 0.3–1.2)
Total Protein: 6.7 g/dL (ref 6.5–8.1)

## 2021-05-26 MED ORDER — SODIUM CHLORIDE 0.9 % IV SOLN
500.0000 mg/m2 | Freq: Once | INTRAVENOUS | Status: AC
  Administered 2021-05-26: 800 mg via INTRAVENOUS
  Filled 2021-05-26: qty 12

## 2021-05-26 MED ORDER — HEPARIN SOD (PORK) LOCK FLUSH 100 UNIT/ML IV SOLN
500.0000 [IU] | Freq: Once | INTRAVENOUS | Status: AC | PRN
  Administered 2021-05-26: 500 [IU]

## 2021-05-26 MED ORDER — SODIUM CHLORIDE 0.9 % IV SOLN
10.0000 mg | Freq: Once | INTRAVENOUS | Status: AC
  Administered 2021-05-26: 10 mg via INTRAVENOUS
  Filled 2021-05-26: qty 1

## 2021-05-26 MED ORDER — SODIUM CHLORIDE 0.9 % IV SOLN
150.0000 mg | Freq: Once | INTRAVENOUS | Status: AC
  Administered 2021-05-26: 150 mg via INTRAVENOUS
  Filled 2021-05-26: qty 5

## 2021-05-26 MED ORDER — POTASSIUM CHLORIDE CRYS ER 20 MEQ PO TBCR: 20.0000 meq | EXTENDED_RELEASE_TABLET | Freq: Every day | ORAL | 0 refills | Status: DC

## 2021-05-26 MED ORDER — SODIUM CHLORIDE 0.9 % IV SOLN
359.5000 mg | Freq: Once | INTRAVENOUS | Status: AC
  Administered 2021-05-26: 360 mg via INTRAVENOUS
  Filled 2021-05-26: qty 36

## 2021-05-26 MED ORDER — SODIUM CHLORIDE 0.9 % IV SOLN
Freq: Once | INTRAVENOUS | Status: AC
Start: 2021-05-26 — End: 2021-05-26

## 2021-05-26 MED ORDER — SODIUM CHLORIDE 0.9% FLUSH
10.0000 mL | INTRAVENOUS | Status: DC | PRN
  Administered 2021-05-26: 10 mL

## 2021-05-26 MED ORDER — PALONOSETRON HCL INJECTION 0.25 MG/5ML
0.2500 mg | Freq: Once | INTRAVENOUS | Status: AC
  Administered 2021-05-26: 0.25 mg via INTRAVENOUS
  Filled 2021-05-26: qty 5

## 2021-05-26 NOTE — Progress Notes (Signed)
See telephone note.

## 2021-05-26 NOTE — Progress Notes (Signed)
[facility] [place] HEMATOLOGY-ONCOLOGY TeleHEALTH VISIT PROGRESS NOTE   I connected with [name] on 05/24/21 at 10:30 AM EST by telephone and verified that I am speaking with the correct person using two identifiers.  I discussed the limitations, risks, security and privacy concerns of performing an evaluation and management service by telemedicine and the availability of in-person appointments. I also discussed with the patient that there may be a patient responsible charge related to this service. The patient expressed understanding and agreed to proceed.  Other persons participating in the visit and their role in the encounter: None Patient's location: [place] Provider's location: [facility]  [name], [place] [address]  DIAGNOSIS: Stage IV (T2 a,N2, M1a) non-small cell lung cancer, adenocarcinoma diagnosed in July 2019 and presented with right upper lobe lung mass in addition to mediastinal lymphadenopathy as well as bilateral pulmonary nodules and malignant right pleural effusion.   Biomarker Findings Microsatellite status - Cannot Be Determined Tumor Mutational Burden - Cannot Be Determined Genomic Findings For a complete list of the genes assayed, please refer to the Appendix. EGFR exon 19 deletion ([id number]) TP53 Y220C 7 Disease relevant genes with no reportable alterations: KRAS, ALK, BRAF, MET, RET, ERBB2, ROS1   PRIOR THERAPY: 1) Status post right Pleurx catheter placement by Dr. Prescott Gum for drainage of malignant right pleural effusion. 2) palliative radiotherapy to the enlarging right upper lobe lung mass and mediastinum under the care of Dr. Sondra Come expected to be completed on January 05, 2021. 3) Tagrisso 80 mg p.o. daily.  First dose was given on 01/29/2018.  Status post 39 months of treatment.  CURRENT THERAPY: Systemic chemotherapy with carboplatin for AUC of 5 and Alimta 500 Mg/M2  every 3 weeks.  First dose 03/17/2021.  The patient will also continue her current treatment with Tagrisso 80 mg p.o. daily.  She is status post 3 cycles.  INTERVAL HISTORY: LANETTE ELL 70 y.o. female returns via a telephone visit. The visit was supposed to be a mychart video visit but there was difficulties getting the patient connected. Therefore, this was converted to a telephone visit. The patient was last seen in the clinic by Dr. Julien Nordmann on 04/28/2021.  The patient underwent a bronchoscopy under the care of Dr. Valeta Harms on 04/21/2021.  She had a follow-up with him recently to review the pathology results which was negative for malignancy on the left and the right biopsy.  Patient also followed up with palliative care who increased her Xanax at bedtime dose due to her anxiety. This has been helpful for her. They also recommended Mucinex and humidifier.  They also discussed protein smoothies and shakes.  For pain control, she takes Tylenol. They have prescribed MS contin per chart review but she has not needed to take this. There was difficulty obtaining authorization with the oxycodone per patient report.  She saw Dr. Durenda Hurt from [facility] for second opinion on 05/09/2021.  He explained the rationale behind her current treatment.  She is not a good candidate for Avastin due to her recent stroke in August 2022. He discussed other treatment options which may be available in 2023 including patritumab deruxtcan and lazertinib/amivantmab. If these are not available, he wrote that they can determine if they can get the agents through an expanded access, single patient IND, or clinical trial if she has progression with chemotherapy.   Since last being seen, the patient has been feeling "a  whole lot better".  She states that she is walking better, faster, and quicker.  Since she is feeling a lot better, the patient has asked her primary care provider to help her obtain physical therapy so she can get  stronger while she feels well.  She also reports that her appetite and taste is coming back.  It appears that the patient is taking 1 mg of Decadron 4 appetite.  She states this was prescribed during a prior hospitalization.  Regarding her breathing, she continues to have significant shortness of breath with minimal exertion but she does state that overall her breathing has improved, particularly her cough.  She states that she is not producing nearly as much phlegm as she used to. She also has nebulizer treatments. Of note, she also has a loculated right pleural effusion.  Otherwise she states she tolerates chemo very well except for significant fatigue 2 to 3 days following treatment.  Because her prior difficulty with decreased appetite and weight loss, she is followed by member the nutritionist team.  She is expected to see them again via a telephone call today.  At a prior appointment appointment, she was prescribed Marinol to help with her appetite.  The patient is not taking this presently.  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  She had some drug to drug interactions with Remeron; therefore, she was not a good candidate for this.    She denies any fevers  or chills. She denies any nausea or vomiting.  She had 1 episode of diarrhea after eating a grilled cheese sandwich in the interval since her last appointment but she states this is due to her being lactose intolerant and this resolved.  She is for the lung cancer support group which she enjoys tremendously. She is here today for evaluation and repeat blood work before considering starting starting cycle #4.  MEDICAL HISTORY: Past Medical History:  Diagnosis Date   Anemia    Anxiety    Arthritis    Asthma    exercise induced   Depression    PMH   Dyspnea    GERD (gastroesophageal reflux disease)    Glaucoma    History of radiation therapy 01/05/2021   IMRT right lung  11/24/2020-01/05/2021  Dr [name]    Hypertension    lung ca dx'd 11/2017   right   Malignant pleural effusion    right   PONV (postoperative nausea and vomiting)    Pre-diabetes    Raynaud's disease    Raynaud's disease    Stroke ([place]) 01/2021   balance off, some express aphasia, weakness    ALLERGIES:  is allergic to penicillins, vicodin [hydrocodone-acetaminophen], and other.  MEDICATIONS:  Current Outpatient Medications  Medication Sig Dispense Refill   acetaminophen (TYLENOL) 500 MG tablet Take 1,000 mg by mouth every 6 (six) hours as needed (pain).     ALPRAZolam (XANAX) 0.25 MG tablet Take 1 tablet (0.25 mg total) by mouth 3 (three) times daily as needed for anxiety. 30 tablet 0   amLODipine (NORVASC) 5 MG tablet Take 5 mg by mouth daily.     aspirin 81 MG chewable tablet Chew 1 tablet (81 mg total) by mouth daily.     b complex vitamins capsule Take 1 capsule by mouth daily.     brompheniramine-pseudoephedrine-DM 30-2-10 MG/5ML syrup Take 5 mLs by mouth 3 (three) times daily as needed. 120 mL 0   carvedilol (COREG) 12.5 MG tablet Take  1 tablet (12.5 mg total) by mouth 2 (two) times daily with a meal. 180 tablet 3   cetirizine (ZYRTEC) 5 MG tablet Take 5 mg by mouth daily.     chlorthalidone (HYGROTON) 25 MG tablet Take 1 tablet (25 mg total) by mouth daily. 30 tablet 6   dexamethasone (DECADRON) 1 MG tablet Take 1 tablet (1 mg total) by mouth 2 (two) times daily as needed (For appetite stimulation). 30 tablet 1   dorzolamide-timolol (COSOPT) 22.3-6.8 MG/ML ophthalmic solution Place 1 drop into both eyes 2 (two) times daily.     enoxaparin (LOVENOX) 60 MG/0.6ML injection Inject 0.6 mLs (60 mg total) into the skin every 12 (twelve) hours. 60 mL 1   esomeprazole (NEXIUM) 20 MG capsule Take 1 capsule (20 mg total) by mouth in the morning and at bedtime. 60 capsule 2   ezetimibe (ZETIA) 10 MG tablet Take 10 mg by mouth daily.      FLUoxetine (PROZAC) 10 MG capsule Take 1 capsule (10 mg total) by mouth daily. 30 capsule  3   folic acid (FOLVITE) 1 MG tablet Take 1 tablet (1 mg total) by mouth daily. 30 tablet 4   guaiFENesin (MUCINEX) 600 MG 12 hr tablet Take 2 tablets (1,200 mg total) by mouth 2 (two) times daily. 30 tablet 10   ipratropium-albuterol (DUONEB) 0.5-2.5 (3) MG/3ML SOLN Take 3 mLs by nebulization every 6 (six) hours as needed (Asthma).     isosorbide mononitrate (IMDUR) 30 MG 24 hr tablet Take 1 tablet (30 mg total) by mouth daily. 90 tablet 3   latanoprost (XALATAN) 0.005 % ophthalmic solution Place 1 drop into both eyes at bedtime.      lidocaine (LIDODERM) 5 % Place 1 patch onto the skin daily. Remove & Discard patch within 12 hours or as directed by MD 30 patch 0   morphine (MS CONTIN) 15 MG 12 hr tablet Take 1 tablet (15 mg total) by mouth every 12 (twelve) hours. 60 tablet 0   Nutritional Supplements (,FEEDING SUPPLEMENT, PROSOURCE PLUS) liquid Take 30 mLs by mouth 2 (two) times daily between meals. 1800 mL 0   osimertinib mesylate (TAGRISSO) 80 MG tablet Take 1 tablet (80 mg total) by mouth daily. 30 tablet 3   OVER THE COUNTER MEDICATION Take 4 drops by mouth daily. Viatmin d     oxyCODONE (OXY IR/ROXICODONE) 5 MG immediate release tablet Take 1 tablet (5 mg total) by mouth every 4 (four) hours as needed for moderate pain. 30 tablet 0   potassium chloride SA (KLOR-CON) 20 MEQ tablet Take 2 tablets (40 mEq total) by mouth daily for 5 doses. 10 tablet 0   prochlorperazine (COMPAZINE) 10 MG tablet Take 1 tablet (10 mg total) by mouth every 6 (six) hours as needed for nausea or vomiting. 30 tablet 0   REPATHA SURECLICK 361 MG/ML SOAJ Inject 140 mg into the skin every 14 (fourteen) days. 6 mL 3   sucralfate (CARAFATE) 1 g tablet Take 1 tablet (1 g total) by mouth 4 (four) times daily. with meals and at bedtime. Crush and dissolve in 10 mL of warm water prior to swallowing 120 tablet 0   No current facility-administered medications for this visit.    SURGICAL HISTORY:  Past Surgical History:   Procedure Laterality Date   ABDOMINAL HYSTERECTOMY     partial   BRONCHIAL BIOPSY  04/21/2021   Procedure: BRONCHIAL BIOPSIES;  Surgeon: [name], DO;  Location: [facility];  Service: Pulmonary;;   BRONCHIAL  BRUSHINGS  04/21/2021   Procedure: BRONCHIAL BRUSHINGS;  Surgeon: [name], DO;  Location: [facility];  Service: Pulmonary;;   BRONCHIAL NEEDLE ASPIRATION BIOPSY  04/21/2021   Procedure: BRONCHIAL NEEDLE ASPIRATION BIOPSIES;  Surgeon: [name], DO;  Location: [facility];  Service: Pulmonary;;   CHEST TUBE INSERTION Right 01/01/2018   Procedure: INSERTION PLEURAL DRAINAGE CATHETER;  Surgeon: Ivin Poot, MD;  Location: [facility] OR;  Service: Thoracic;  Laterality: Right;   CHEST TUBE INSERTION  04/21/2021   Procedure: CHEST TUBE INSERTION;  Surgeon: [name], DO;  Location: [facility];  Service: Pulmonary;;   COLONOSCOPY     CORONARY STENT INTERVENTION N/A 06/16/2019   Procedure: CORONARY STENT INTERVENTION;  Surgeon: Jettie Booze, MD;  Location: [facility];  Service: Cardiovascular;  Laterality: N/A;   DILATION AND CURETTAGE OF UTERUS     EYE SURGERY     due to Glaucoma   IR IMAGING GUIDED PORT INSERTION  03/22/2021   IR PORT REPAIR CENTRAL VENOUS ACCESS DEVICE  04/15/2021   LEFT HEART CATH AND CORONARY ANGIOGRAPHY N/A 06/16/2019   Procedure: LEFT HEART CATH AND CORONARY ANGIOGRAPHY;  Surgeon: Jettie Booze, MD;  Location: [facility];  Service: Cardiovascular;  Laterality: N/A;   REMOVAL OF PLEURAL DRAINAGE CATHETER Right 11/07/2018   Procedure: REMOVAL OF PLEURAL DRAINAGE CATHETER;  Surgeon: Ivin Poot, MD;  Location: [facility];  Service: Thoracic;  Laterality: Right;   ROTATOR CUFF REPAIR     TUBAL LIGATION     VIDEO BRONCHOSCOPY WITH ENDOBRONCHIAL NAVIGATION Bilateral 04/21/2021   Procedure: VIDEO BRONCHOSCOPY WITH ENDOBRONCHIAL NAVIGATION;  Surgeon: [name], DO;  Location: [facility];  Service: Pulmonary;   Laterality: Bilateral;  ION   WISDOM TOOTH EXTRACTION      REVIEW OF SYSTEMS:   Review of Systems  Constitutional: Positive for fatigue 2 to 3 days following treatment.  Positive for improving appetite and weight.  Negative for chills and fever. HENT: Negative for mouth sores, nosebleeds, sore throat and trouble swallowing.   Eyes: Negative for eye problems and icterus.  Respiratory: Positive for dyspnea on exertion and improving but persistent cough.  Negative for hemoptysis and wheezing.   Cardiovascular: Positive for occasional chest tightness.  Negative for leg swelling.  Gastrointestinal: Negative for abdominal pain, constipation, diarrhea, nausea and vomiting.  Genitourinary: Negative for bladder incontinence, difficulty urinating, dysuria, frequency and hematuria.   Musculoskeletal: Negative for back pain, gait problem, neck pain and neck stiffness.  Skin: Negative for itching and rash.  Neurological: Negative for dizziness, extremity weakness, gait problem, headaches, light-headedness and seizures.  Hematological: Negative for adenopathy. Does not bruise/bleed easily.  Psychiatric/Behavioral: Negative for confusion, depression and sleep disturbance. The patient is not nervous/anxious.     PHYSICAL EXAMINATION:  There were no vitals taken for this visit.  ECOG PERFORMANCE STATUS: 2  Physical Exam  Psychiatric: Mood, memory and judgment normal.  Vitals reviewed.  LABORATORY DATA: Lab Results  Component Value Date   WBC 7.7 05/05/2021   HGB 8.8 (L) 05/05/2021   HCT 27.8 (L) 05/05/2021   MCV 82.0 05/05/2021   PLT 475 (H) 05/05/2021      Chemistry      Component Value Date/Time   NA 136 05/05/2021 0828   NA 140 03/04/2021 1516   K 3.3 (L) 05/05/2021 0828   CL 99 05/05/2021 0828   CO2 28 05/05/2021 0828   BUN 13 05/05/2021 0828   BUN 21 03/04/2021 1516  CREATININE 0.89 05/05/2021 0828      Component Value Date/Time   CALCIUM 9.8 05/05/2021 0828   ALKPHOS 54  05/05/2021 0828   AST 19 05/05/2021 0828   ALT 16 05/05/2021 0828   BILITOT 0.2 (L) 05/05/2021 0828       RADIOGRAPHIC STUDIES:  CT Chest W Contrast  Result Date: 04/27/2021 CLINICAL DATA:  70 year old female with history of non-small cell lung cancer. Status post radiation therapy and chemotherapy (radiation therapy completed in late July 2022). Evaluate for treatment response. EXAM: CT CHEST WITH CONTRAST TECHNIQUE: Multidetector CT imaging of the chest was performed during intravenous contrast administration. CONTRAST:  21m OMNIPAQUE IOHEXOL 300 MG/ML  SOLN COMPARISON:  Chest CT 03/04/2021. FINDINGS: Cardiovascular: Heart size is normal. There is no significant pericardial fluid, thickening or pericardial calcification. There is aortic atherosclerosis, as well as atherosclerosis of the great vessels of the mediastinum and the coronary arteries, including calcified atherosclerotic plaque in the left anterior descending and right coronary arteries. Right internal jugular single-lumen porta cath with tip terminating in the right atrium. Mediastinum/Nodes: There is amorphous soft tissue in the subcarinal region, without discrete enlarged lymph node, very similar to the prior study. Esophagus is unremarkable in appearance. No axillary lymphadenopathy. Lungs/Pleura: Trace residual left-sided pneumothorax best noted medially (axial image 48 of series 4), of doubtful clinical significance. Treated right upper lobe neoplasm near the apex currently measures 2.5 x 1.7 cm (axial image 31 of series 4), previously 2.7 x 1.7 cm on 03/04/2021. This is surrounded by extensive consolidation, with patchy areas of septal thickening, ground-glass attenuation and thickening of the peribronchovascular interstitium noted in the adjacent portions of the right upper lung, overall increased compared to the recent prior examination, likely to reflect evolving postradiation changes of resolving pneumonitis and developing  fibrosis. Widespread areas of septal thickening and micronodularity seen throughout the left lung on the prior study have nearly completely regressed. Moderate right pleural effusion, partially loculated in the right major fissure, similar to the prior study. Upper Abdomen: Unremarkable. Musculoskeletal: Small amount of gas in the left chest wall, presumably related to recently removed chest tube. There are no aggressive appearing lytic or blastic lesions noted in the visualized portions of the skeleton. IMPRESSION: 1. Today's study demonstrates evolving postradiation changes in the lungs, likely with resolving postradiation pneumonitis and developing postradiation fibrosis. Treated right upper lobe lesion is stable in size. 2. Moderate right partially loculated pleural effusion, similar to the prior examination. 3. Aortic atherosclerosis, in addition to two vessel coronary artery disease. Please note that although the presence of coronary artery calcium documents the presence of coronary artery disease, the severity of this disease and any potential stenosis cannot be assessed on this non-gated CT examination. Assessment for potential risk factor modification, dietary therapy or pharmacologic therapy may be warranted, if clinically indicated. Aortic Atherosclerosis (ICD10-I70.0). Electronically Signed   By: DVinnie LangtonM.D.   On: 04/27/2021 14:42   MR BRAIN W WO CONTRAST  Result Date: 05/05/2021 [facility] NEUROIM[facility] REPORT STUDY DATE: 05/03/21 PATIENT NAME: PTIMBER MARSHMANDOB: 8October 03, 1952MRN: [id number] CLINICIAN: MFrann Rider NP CLINICAL HISTORY: 70year old female with strokes. Follow up study. EXAM: MR BRAIN W WO CONTRAST TECHNIQUE: MRI of the brain with and without contrast was obtained utilizing 5 mm axial slices with T1, T2, T2 flair, SWI and diffusion weighted views.  T1 sagittal, T2 coronal and postcontrast views in the axial and coronal plane were obtained.  CONTRAST: 170mmultihance COMPARISON: 02/27/21 MRI  IMAGING SITE: [facility] [address] (1.5 Tesla MRI)  FINDINGS: No abnormal lesions are seen on diffusion-weighted views to suggest acute ischemia. The cortical sulci, fissures and cisterns are normal in size and appearance. Lateral, third and fourth ventricle are normal in size and appearance. No extra-axial fluid collections are seen. No evidence of mass effect or midline shift.  Moderate periventricular, subcortical, juxtacortical and cerebellar chronic small vessel ischemic disease. No abnormal lesions are seen on post contrast views.  On sagittal views the posterior fossa, pituitary gland and corpus callosum are notable for partially empty sella.  Mild dystrophic mineralization versus hemosiderin deposits within the right cerebellum, left occipital and right posterior frontal regions.  No other evidence of intracranial hemorrhage on SWI views. The orbits and their contents, paranasal sinuses and calvarium are unremarkable.  Intracranial flow voids are present.   MRI brain with and without contrast demonstrating: -Moderate periventricular, subcortical, juxtacortical, and cerebellar chronic small vessel ischemic disease. -Chronic left occipital cortical infarctions. -No abnormal enhancing lesions.  Previously noted areas of enhancement likely represented subacute infarcts and cortical laminar necrosis. INTERPRETING PHYSICIAN: Penni Bombard, MD Certified in Neurology, Neurophysiology and Neuroimaging [facility] [address] [place], [place] [address] [phone]     ASSESSMENT/PLAN:  This is a very pleasant 70 year old never smoker African-American female diagnosed with stage IV non-small cell lung cancer, adenocarcinoma.  She was positive for an EGFR mutation with deletion in exon 19.  She was diagnosed in July 2019 and presented with right upper lobe lung mass in addition to mediastinal lymphadenopathy  as well as bilateral pulmonary nodules and malignant right pleural effusion.   The patient was started on treatment with Tagrisso 80 mg p.o. daily status post 39 months of treatment. Started on 01/29/2018.   In April 2022, she showed evidence of disease progression with interval progression of the right apical lung mass in addition to progression of mediastinal lymphadenopathy concerning for worsening of her disease. She had repeat Guardant 360 molecular studies which did not show evidence of new resistant mutations.  Therefore, she was referred to radiation oncology and completed palliative radiotherapy to the enlarging right upper lobe lung mass and mediastinum under the care of Dr. Sondra Come.  This was completed in July 2022.   Unfortunately, the patient recently was found to have evidence of disease progression.  Therefore, Dr. Julien Nordmann started the patient on systemic chemotherapy with carboplatin for AUC of 5 and Alimta 500 mg per metered squared.  She is status post 3 cycles and she tolerated it fairly well despite her ongoing issues with fatigue, chest tightness, cough, and weight loss.  She is not a good candidate for Avastin due to her recent CVA in August 2022.  She also is continuing to take Tagrisso as it is protective against progressive metastatic disease to the brain.  The patient underwent a repeat bronchoscopy and biopsy for repeat molecular testing.  The sample from the left and right lung biopsy was negative for malignancy. This was performed in November 2022.    The patient saw Dr. Durenda Hurt from [facility] for second opinion in November 2022.  They discussed if she progresses on chemotherapy that there may be upcoming options. He discussed other treatment options which may be available in 2023 including patritumab deruxtcan and lazertinib/amivantmab. If these are not available, he wrote that they can determine if they can get the agents through an expanded access, single patient  IND, or clinical trial if she  has progression with chemotherapy.    Labs reviewed.  Recommend that she proceed with cycle #4 today as scheduled.  We will see her back for follow-up visit in 3 weeks for evaluation before undergoing cycle #5.  Her potassium is low. She states she completed her prior course of potassium supplements. I will send in a prescription for 20 meq daily for 5 days. We check her labs weekly and will reassess potassium next week.   Discussed with the patient that she will be due for a restaging CT scan around July 04, 2021 prior to her office visit on January 19.  Per Dr. Julien Nordmann, it is okay just to arrange for a CT of the chest.  We will hold off on a CT of the abdomen and pelvis for now.  I reviewed the Decadron with Dr. Julien Nordmann.  The typical Decadron premedication for Alimta is 4 mg tablet twice a day the day before chemo, the day of chemo, the day after chemotherapy.  She was prescribed 1 mg of Decadron during her prior hospital admission which she believes is helping her appetite.  Dr. Julien Nordmann stated that she either can take our Decadron premedication or take the 1 mg Decadron tablet as prescribed by her other provider.  She would like to continue taking the decadron as she has been taking it. In that case, she will obtain a refill by her PCP.   She will continue using her nebulizers and Mucinex for her cough and shortness of breath.  The patient was given a prescription cough medication by palliative care but she states she has not needed to use this at this time. She states her cough has improved since last being seen.   She will continue taking Tylenol for pain (chest tightness), but she does have other prescription pain medications if needed.  The patient knows that she has these if needed but she mentally does not like knowing/taking opioid medications.  She will continue taking Xanax for her anxiety as prescribed by palliative care.  I discussed the assessment and  treatment plan with the patient. The patient was provided an opportunity to ask questions and all were answered. The patient agreed with the plan and demonstrated an understanding of the instructions.  The patient was advised to call back or seek an in-person evaluation if the symptoms worsen or if the condition fails to improve as anticipated.        Orders Placed This Encounter  Procedures   CT Chest W Contrast    Standing Status:   Future    Standing Expiration Date:   05/26/2022    Order Specific Question:   If indicated for the ordered procedure, I authorize the administration of contrast media per Radiology protocol    Answer:   Yes    Order Specific Question:   Preferred imaging location?    Answer:   [facility] [facility]     The total time spent in the appointment was 20-29 minutes.   Cassandra L Heilingoetter, PA-C 05/26/21

## 2021-05-26 NOTE — Progress Notes (Signed)
Patient presents for treatment. RN assessment completed along with the following:  Labs/vitals reviewed - Yes, and within treatment parameters.   Weight within 10% of previous measurement - Yes Oncology Treatment Attestation completed for current therapy- Yes, on date 02/18/21 Informed consent completed and reflects current therapy/intent - Yes, on date 03/17/21             Provider progress note reviewed - Yes, today's provider note was reviewed. Treatment/Antibody/Supportive plan reviewed - Yes, and there are no adjustments needed for today's treatment. S&H and other orders reviewed - Yes, and there are no additional orders identified. Previous treatment date reviewed - Yes, and the appropriate amount of time has elapsed between treatments. Clinic Hand Off Received from - Dennison Nancy, NP  Patient to proceed with treatment.

## 2021-05-26 NOTE — Telephone Encounter (Signed)
Telephone follow-up completed with patient while she was at [facility].  Patient is receiving treatment for stage IV non-small cell lung cancer and followed by Dr. Julien Nordmann.  Weight improved and documented as 118.6 pounds December 8, from 115.6 pounds November 10. Noted labs: Potassium 3.2, glucose 119, hemoglobin 8.8.  Patient reports she has more energy. Her appetite has improved and she is eating more. She reports decreased phlegm but has not tolerated baking soda and salt water rinses.  She states it makes her gag. Reports she continues to be short of breath. She is sleeping better. She is consuming Ensure and appears to be tolerating about 1 carton 3 days a week.  She denies diarrhea and reports after following this RD's recommendations for consuming Ensure, diarrhea resolved.  She does report extreme abdominal pain after consuming cheese and attributes this to lactose.  I reminded her that Ensure is lactose-free. Overall patient is very pleased with how she is feeling.  Nutrition diagnosis: Unintended weight loss improved.  Intervention: Continue strategies for increased calorie and protein intake. Try to increase Ensure to 1 carton daily. Reviewed foods high in potassium at patient request. Provided support and encouragement.  Monitoring, evaluation, goals: Patient will tolerate improved calorie and protein intake to continue weight gain.  Next visit: To be scheduled as needed.  Patient has RD contact information and understands to contact us if needs arise sooner.  **Disclaimer: This note was dictated with voice recognition software. Similar sounding words can inadvertently be transcribed and this note may contain transcription errors which may not have been corrected upon publication of note.**

## 2021-05-26 NOTE — Patient Instructions (Signed)
[place]   Discharge Instructions: Thank you for choosing [facility] to provide your oncology and hematology care.   If you have a lab appointment with the [facility], please go directly to the [facility] and check in at the registration area.   Wear comfortable clothing and clothing appropriate for easy access to any Portacath or PICC line.   We strive to give you quality time with your provider. You may need to reschedule your appointment if you arrive late (15 or more minutes).  Arriving late affects you and other patients whose appointments are after yours.  Also, if you miss three or more appointments without notifying the office, you may be dismissed from the clinic at the provider's discretion.      For prescription refill requests, have your pharmacy contact our office and allow 72 hours for refills to be completed.    Today you received the following chemotherapy and/or immunotherapy agents Alimta, Carboplatin      To help prevent nausea and vomiting after your treatment, we encourage you to take your nausea medication as directed.  BELOW ARE SYMPTOMS THAT SHOULD BE REPORTED IMMEDIATELY: *FEVER GREATER THAN 100.4 F (38 C) OR HIGHER *CHILLS OR SWEATING *NAUSEA AND VOMITING THAT IS NOT CONTROLLED WITH YOUR NAUSEA MEDICATION *UNUSUAL SHORTNESS OF BREATH *UNUSUAL BRUISING OR BLEEDING *URINARY PROBLEMS (pain or burning when urinating, or frequent urination) *BOWEL PROBLEMS (unusual diarrhea, constipation, pain near the anus) TENDERNESS IN MOUTH AND THROAT WITH OR WITHOUT PRESENCE OF ULCERS (sore throat, sores in mouth, or a toothache) UNUSUAL RASH, SWELLING OR PAIN  UNUSUAL VAGINAL DISCHARGE OR ITCHING   Items with * indicate a potential emergency and should be followed up as soon as possible or go to the Emergency Department if any problems should occur.  Please show the CHEMOTHERAPY ALERT CARD or IMMUNOTHERAPY ALERT CARD at  check-in to the Emergency Department and triage nurse.  Should you have questions after your visit or need to cancel or reschedule your appointment, please contact [facility]  Dept: [phone]  and follow the prompts.  Office hours are 8:00 a.m. to 4:30 p.m. Monday - Friday. Please note that voicemails left after 4:00 p.m. may not be returned until the following business day.  We are closed weekends and major holidays. You have access to a nurse at all times for urgent questions. Please call the main number to the clinic Dept: [phone] and follow the prompts.   For any non-urgent questions, you may also contact your provider using MyChart. We now offer e-Visits for anyone 43 and older to request care online for non-urgent symptoms. For details visit [email or web address].   Also download the MyChart app! Go to the app store, search "MyChart", open the app, select [facility], and log in with your MyChart username and password.  Due to Covid, a mask is required upon entering the hospital/clinic. If you do not have a mask, one will be given to you upon arrival. For doctor visits, patients may have 1 support person aged 54 or older with them. For treatment visits, patients cannot have anyone with them due to current Covid guidelines and our immunocompromised population.   Pemetrexed injection What is this medication? PEMETREXED (PEM e TREX ed) is a chemotherapy drug used to treat lung cancers like non-small cell lung cancer and mesothelioma. It may also be used to treat other cancers. This medicine may be used for other purposes; ask your health  care provider or pharmacist if you have questions. COMMON BRAND NAME(S): Alimta, PEMFEXY What should I tell my care team before I take this medication? They need to know if you have any of these conditions: infection (especially a virus infection such as chickenpox, cold sores, or herpes) kidney disease low blood counts,  like low white cell, platelet, or red cell counts lung or breathing disease, like asthma radiation therapy an unusual or allergic reaction to pemetrexed, other medicines, foods, dyes, or preservative pregnant or trying to get pregnant breast-feeding How should I use this medication? This drug is given as an infusion into a vein. It is administered in a hospital or clinic by a specially trained health care professional. Talk to your pediatrician regarding the use of this medicine in children. Special care may be needed. Overdosage: If you think you have taken too much of this medicine contact a poison control center or emergency room at once. NOTE: This medicine is only for you. Do not share this medicine with others. What if I miss a dose? It is important not to miss your dose. Call your doctor or health care professional if you are unable to keep an appointment. What may interact with this medication? This medicine may interact with the following medications: Ibuprofen This list may not describe all possible interactions. Give your health care provider a list of all the medicines, herbs, non-prescription drugs, or dietary supplements you use. Also tell them if you smoke, drink alcohol, or use illegal drugs. Some items may interact with your medicine. What should I watch for while using this medication? Visit your doctor for checks on your progress. This drug may make you feel generally unwell. This is not uncommon, as chemotherapy can affect healthy cells as well as cancer cells. Report any side effects. Continue your course of treatment even though you feel ill unless your doctor tells you to stop. In some cases, you may be given additional medicines to help with side effects. Follow all directions for their use. Call your doctor or health care professional for advice if you get a fever, chills or sore throat, or other symptoms of a cold or flu. Do not treat yourself. This drug decreases your  body's ability to fight infections. Try to avoid being around people who are sick. This medicine may increase your risk to bruise or bleed. Call your doctor or health care professional if you notice any unusual bleeding. Be careful brushing and flossing your teeth or using a toothpick because you may get an infection or bleed more easily. If you have any dental work done, tell your dentist you are receiving this medicine. Avoid taking products that contain aspirin, acetaminophen, ibuprofen, naproxen, or ketoprofen unless instructed by your doctor. These medicines may hide a fever. Call your doctor or health care professional if you get diarrhea or mouth sores. Do not treat yourself. To protect your kidneys, drink water or other fluids as directed while you are taking this medicine. Do not become pregnant while taking this medicine or for 6 months after stopping it. Women should inform their doctor if they wish to become pregnant or think they might be pregnant. Men should not father a child while taking this medicine and for 3 months after stopping it. This may interfere with the ability to father a child. You should talk to your doctor or health care professional if you are concerned about your fertility. There is a potential for serious side effects to an unborn child. Talk  to your health care professional or pharmacist for more information. Do not breast-feed an infant while taking this medicine or for 1 week after stopping it. What side effects may I notice from receiving this medication? Side effects that you should report to your doctor or health care professional as soon as possible: allergic reactions like skin rash, itching or hives, swelling of the face, lips, or tongue breathing problems redness, blistering, peeling or loosening of the skin, including inside the mouth signs and symptoms of bleeding such as bloody or black, tarry stools; red or dark-brown urine; spitting up blood or brown  material that looks like coffee grounds; red spots on the skin; unusual bruising or bleeding from the eye, gums, or nose signs and symptoms of infection like fever or chills; cough; sore throat; pain or trouble passing urine signs and symptoms of kidney injury like trouble passing urine or change in the amount of urine signs and symptoms of liver injury like dark yellow or brown urine; general ill feeling or flu-like symptoms; light-colored stools; loss of appetite; nausea; right upper belly pain; unusually weak or tired; yellowing of the eyes or skin Side effects that usually do not require medical attention (report to your doctor or health care professional if they continue or are bothersome): constipation mouth sores nausea, vomiting unusually weak or tired This list may not describe all possible side effects. Call your doctor for medical advice about side effects. You may report side effects to FDA at 1-800-FDA-1088. Where should I keep my medication? This drug is given in a hospital or clinic and will not be stored at home. NOTE: This sheet is a summary. It may not cover all possible information. If you have questions about this medicine, talk to your doctor, pharmacist, or health care provider.  2022 Elsevier/Gold Standard (2017-07-31 00:00:00)  Carboplatin injection What is this medication? CARBOPLATIN (KAR boe pla tin) is a chemotherapy drug. It targets fast dividing cells, like cancer cells, and causes these cells to die. This medicine is used to treat ovarian cancer and many other cancers. This medicine may be used for other purposes; ask your health care provider or pharmacist if you have questions. COMMON BRAND NAME(S): Paraplatin What should I tell my care team before I take this medication? They need to know if you have any of these conditions: blood disorders hearing problems kidney disease recent or ongoing radiation therapy an unusual or allergic reaction to carboplatin,  cisplatin, other chemotherapy, other medicines, foods, dyes, or preservatives pregnant or trying to get pregnant breast-feeding How should I use this medication? This drug is usually given as an infusion into a vein. It is administered in a hospital or clinic by a specially trained health care professional. Talk to your pediatrician regarding the use of this medicine in children. Special care may be needed. Overdosage: If you think you have taken too much of this medicine contact a poison control center or emergency room at once. NOTE: This medicine is only for you. Do not share this medicine with others. What if I miss a dose? It is important not to miss a dose. Call your doctor or health care professional if you are unable to keep an appointment. What may interact with this medication? medicines for seizures medicines to increase blood counts like filgrastim, pegfilgrastim, sargramostim some antibiotics like amikacin, gentamicin, neomycin, streptomycin, tobramycin vaccines Talk to your doctor or health care professional before taking any of these medicines: acetaminophen aspirin ibuprofen ketoprofen naproxen This list may not  describe all possible interactions. Give your health care provider a list of all the medicines, herbs, non-prescription drugs, or dietary supplements you use. Also tell them if you smoke, drink alcohol, or use illegal drugs. Some items may interact with your medicine. What should I watch for while using this medication? Your condition will be monitored carefully while you are receiving this medicine. You will need important blood work done while you are taking this medicine. This drug may make you feel generally unwell. This is not uncommon, as chemotherapy can affect healthy cells as well as cancer cells. Report any side effects. Continue your course of treatment even though you feel ill unless your doctor tells you to stop. In some cases, you may be given additional  medicines to help with side effects. Follow all directions for their use. Call your doctor or health care professional for advice if you get a fever, chills or sore throat, or other symptoms of a cold or flu. Do not treat yourself. This drug decreases your body's ability to fight infections. Try to avoid being around people who are sick. This medicine may increase your risk to bruise or bleed. Call your doctor or health care professional if you notice any unusual bleeding. Be careful brushing and flossing your teeth or using a toothpick because you may get an infection or bleed more easily. If you have any dental work done, tell your dentist you are receiving this medicine. Avoid taking products that contain aspirin, acetaminophen, ibuprofen, naproxen, or ketoprofen unless instructed by your doctor. These medicines may hide a fever. Do not become pregnant while taking this medicine. Women should inform their doctor if they wish to become pregnant or think they might be pregnant. There is a potential for serious side effects to an unborn child. Talk to your health care professional or pharmacist for more information. Do not breast-feed an infant while taking this medicine. What side effects may I notice from receiving this medication? Side effects that you should report to your doctor or health care professional as soon as possible: allergic reactions like skin rash, itching or hives, swelling of the face, lips, or tongue signs of infection - fever or chills, cough, sore throat, pain or difficulty passing urine signs of decreased platelets or bleeding - bruising, pinpoint red spots on the skin, black, tarry stools, nosebleeds signs of decreased red blood cells - unusually weak or tired, fainting spells, lightheadedness breathing problems changes in hearing changes in vision chest pain high blood pressure low blood counts - This drug may decrease the number of white blood cells, red blood cells and  platelets. You may be at increased risk for infections and bleeding. nausea and vomiting pain, swelling, redness or irritation at the injection site pain, tingling, numbness in the hands or feet problems with balance, talking, walking trouble passing urine or change in the amount of urine Side effects that usually do not require medical attention (report to your doctor or health care professional if they continue or are bothersome): hair loss loss of appetite metallic taste in the mouth or changes in taste This list may not describe all possible side effects. Call your doctor for medical advice about side effects. You may report side effects to FDA at 1-800-FDA-1088. Where should I keep my medication? This drug is given in a hospital or clinic and will not be stored at home. NOTE: This sheet is a summary. It may not cover all possible information. If you have questions about this medicine,  talk to your doctor, pharmacist, or health care provider.  2022 Elsevier/Gold Standard (2007-11-13 00:00:00)

## 2021-06-02 ENCOUNTER — Ambulatory Visit (INDEPENDENT_AMBULATORY_CARE_PROVIDER_SITE_OTHER): Payer: Medicare HMO | Admitting: Psychologist

## 2021-06-02 ENCOUNTER — Other Ambulatory Visit: Payer: Self-pay

## 2021-06-02 ENCOUNTER — Inpatient Hospital Stay (HOSPITAL_BASED_OUTPATIENT_CLINIC_OR_DEPARTMENT_OTHER): Payer: Medicare HMO | Admitting: Nurse Practitioner

## 2021-06-02 ENCOUNTER — Telehealth: Payer: Self-pay | Admitting: *Deleted

## 2021-06-02 ENCOUNTER — Inpatient Hospital Stay: Payer: Medicare HMO

## 2021-06-02 VITALS — BP 138/61 | HR 87 | Temp 97.3°F | Resp 18 | Wt 117.1 lb

## 2021-06-02 DIAGNOSIS — R5383 Other fatigue: Secondary | ICD-10-CM | POA: Diagnosis not present

## 2021-06-02 DIAGNOSIS — F320 Major depressive disorder, single episode, mild: Secondary | ICD-10-CM

## 2021-06-02 DIAGNOSIS — F419 Anxiety disorder, unspecified: Secondary | ICD-10-CM | POA: Diagnosis not present

## 2021-06-02 DIAGNOSIS — Z7189 Other specified counseling: Secondary | ICD-10-CM

## 2021-06-02 DIAGNOSIS — Z95828 Presence of other vascular implants and grafts: Secondary | ICD-10-CM

## 2021-06-02 DIAGNOSIS — K219 Gastro-esophageal reflux disease without esophagitis: Secondary | ICD-10-CM | POA: Diagnosis not present

## 2021-06-02 DIAGNOSIS — J45909 Unspecified asthma, uncomplicated: Secondary | ICD-10-CM | POA: Diagnosis not present

## 2021-06-02 DIAGNOSIS — G893 Neoplasm related pain (acute) (chronic): Secondary | ICD-10-CM | POA: Diagnosis not present

## 2021-06-02 DIAGNOSIS — I10 Essential (primary) hypertension: Secondary | ICD-10-CM | POA: Diagnosis not present

## 2021-06-02 DIAGNOSIS — C3411 Malignant neoplasm of upper lobe, right bronchus or lung: Secondary | ICD-10-CM | POA: Diagnosis not present

## 2021-06-02 DIAGNOSIS — Z515 Encounter for palliative care: Secondary | ICD-10-CM | POA: Diagnosis not present

## 2021-06-02 DIAGNOSIS — I2510 Atherosclerotic heart disease of native coronary artery without angina pectoris: Secondary | ICD-10-CM | POA: Diagnosis not present

## 2021-06-02 DIAGNOSIS — R69 Illness, unspecified: Secondary | ICD-10-CM | POA: Diagnosis not present

## 2021-06-02 DIAGNOSIS — R531 Weakness: Secondary | ICD-10-CM

## 2021-06-02 DIAGNOSIS — C3491 Malignant neoplasm of unspecified part of right bronchus or lung: Secondary | ICD-10-CM | POA: Diagnosis not present

## 2021-06-02 DIAGNOSIS — Z923 Personal history of irradiation: Secondary | ICD-10-CM | POA: Diagnosis not present

## 2021-06-02 DIAGNOSIS — Z5111 Encounter for antineoplastic chemotherapy: Secondary | ICD-10-CM | POA: Diagnosis not present

## 2021-06-02 LAB — CBC WITH DIFFERENTIAL (CANCER CENTER ONLY)
Abs Immature Granulocytes: 0 10*3/uL (ref 0.00–0.07)
Basophils Absolute: 0 10*3/uL (ref 0.0–0.1)
Basophils Relative: 0 %
Eosinophils Absolute: 0 10*3/uL (ref 0.0–0.5)
Eosinophils Relative: 1 %
HCT: 27.9 % — ABNORMAL LOW (ref 36.0–46.0)
Hemoglobin: 8.7 g/dL — ABNORMAL LOW (ref 12.0–15.0)
Immature Granulocytes: 0 %
Lymphocytes Relative: 24 %
Lymphs Abs: 0.4 10*3/uL — ABNORMAL LOW (ref 0.7–4.0)
MCH: 26.6 pg (ref 26.0–34.0)
MCHC: 31.2 g/dL (ref 30.0–36.0)
MCV: 85.3 fL (ref 80.0–100.0)
Monocytes Absolute: 0.2 10*3/uL (ref 0.1–1.0)
Monocytes Relative: 11 %
Neutro Abs: 1.1 10*3/uL — ABNORMAL LOW (ref 1.7–7.7)
Neutrophils Relative %: 64 %
Platelet Count: 182 10*3/uL (ref 150–400)
RBC: 3.27 MIL/uL — ABNORMAL LOW (ref 3.87–5.11)
RDW: 18.5 % — ABNORMAL HIGH (ref 11.5–15.5)
WBC Count: 1.7 10*3/uL — ABNORMAL LOW (ref 4.0–10.5)
nRBC: 0 % (ref 0.0–0.2)

## 2021-06-02 LAB — CMP (CANCER CENTER ONLY)
ALT: 75 U/L — ABNORMAL HIGH (ref 0–44)
AST: 50 U/L — ABNORMAL HIGH (ref 15–41)
Albumin: 3.3 g/dL — ABNORMAL LOW (ref 3.5–5.0)
Alkaline Phosphatase: 69 U/L (ref 38–126)
Anion gap: 11 (ref 5–15)
BUN: 22 mg/dL (ref 8–23)
CO2: 25 mmol/L (ref 22–32)
Calcium: 9.3 mg/dL (ref 8.9–10.3)
Chloride: 103 mmol/L (ref 98–111)
Creatinine: 1.08 mg/dL — ABNORMAL HIGH (ref 0.44–1.00)
GFR, Estimated: 55 mL/min — ABNORMAL LOW (ref >60)
Glucose, Bld: 98 mg/dL (ref 70–99)
Potassium: 3.2 mmol/L — ABNORMAL LOW (ref 3.5–5.1)
Sodium: 139 mmol/L (ref 135–145)
Total Bilirubin: 0.4 mg/dL (ref 0.3–1.2)
Total Protein: 7.1 g/dL (ref 6.5–8.1)

## 2021-06-02 LAB — SAMPLE TO BLOOD BANK

## 2021-06-02 MED ORDER — HEPARIN SOD (PORK) LOCK FLUSH 100 UNIT/ML IV SOLN
500.0000 [IU] | Freq: Once | INTRAVENOUS | Status: AC
  Administered 2021-06-02: 500 [IU] via INTRAVENOUS

## 2021-06-02 MED ORDER — HYOSCYAMINE SULFATE 0.125 MG SL SUBL: 0.2500 mg | SUBLINGUAL_TABLET | Freq: Four times a day (QID) | SUBLINGUAL | 0 refills | Status: DC | PRN

## 2021-06-02 MED ORDER — SODIUM CHLORIDE 0.9% FLUSH
10.0000 mL | INTRAVENOUS | Status: DC | PRN
  Administered 2021-06-02: 10 mL via INTRAVENOUS

## 2021-06-02 NOTE — Progress Notes (Signed)
°  Outpatient Palliative Care  [phone] ________________________________ Nursing Assessment:  Name: Allison [name]        MRN: [id number]  Date of Service: 06/02/2021 DOB: March 27, 1951 Visit Type: Follow up   Support at Visit: Unaccompanied   Review of Systems: General: Allison [name] reports increasing weakness and fatigue. She stated that she wakes up feeling well rested but her energy is depleted quickly as she is up and moving. Reports napping throughout the day.  States she is sleeping well at night-taking Xanax to assist.  Neuro: Reports Raynaud's. Reports blurred vision since stroke.  Cardiac: None Pulmonary: Reports tightness in chest that increases throughout the day.  Reports improvement with coughing and phlegm-still occurring.  SHOB with activity.   GI: Reports a 25% normal appetite. Drinking Ensure throughout the day.  Recent nausea after treatment-antiemetic improved this.  Constipation at times. Reports recent diarrhea spells after eating certain foods, along with sudden onset of weakness.  GU: None Integumentary: None Psychological: Allison [name] reports anxiety at times and feeling "jittery". She states that Xanax helps with this. She endorses sadness at times including crying. She said it "creeps up" at times. She said that she is seeing a psychologist. Emotional listening and support provided.     Medication Changes/Additions: Informed Allison [name] that [place], NP had filled a prescription for Nexium for her. She said her pharmacy never notified her of this and that she would check on it when she leaves here. I told her to call us if there were any problems.     Pain Review: Scale of 0-10: 0/Denies pain  Location: Description: Frequency: Relief:   Social Review: Living Situation: lives alone but daughters/family are with her mostly 24/7 to help Support: Family    Falls in the last 3 months? None  Assistive Device Use? Using a cane-stated she doesn't use at  home  Functional Status: Completes ADL's independently   ACP Form Review:   Family/Patient Concerns: Allison [name] main concerns are her fatigue and diarrhea episodes.   Provider notified of assessment and patient/family concerns. Patient instructed to call with any questions or concerns.

## 2021-06-02 NOTE — Telephone Encounter (Signed)
TCT patient regarding recent labs. No answer but was able to leave vm message for pt to return this call to [phone]. Pt's K+ was low @ 3.2.  She is to increase her dietary intake of iron rich foods.

## 2021-06-02 NOTE — Progress Notes (Signed)
[place] Counselor/Therapist Progress Note  Patient ID: Allison [name], MRN: [id number],    Date: 06/02/2021  Time Spent: 3:04 pm to 3:45 pm; total time: 41 minutes  This session was held via phone teletherapy due to the coronavirus risk at this time. The patient consented to phone teletherapy and was located at her home during this session. She is aware it is the responsibility of the patient to secure confidentiality on her end of the session. The provider was in a private home office for the duration of this session. Limits of confidentiality were discussed with the patient.   Treatment Type: Individual Therapy  Reported Symptoms: The patient endorsed some depressive symptoms.   Mental Status Exam: Appearance:  NA      Behavior: Appropriate  Motor: NA  Speech/Language:  Normal Rate  Affect: Appropriate  Mood: normal  Thought process: normal  Thought content:   WNL  Sensory/Perceptual disturbances:   WNL  Orientation: oriented to person, place, time/date, and situation  Attention: Good  Concentration: Good  Memory: WNL  Fund of knowledge:  Good  Insight:   Good  Judgment:  Good  Impulse Control: Good   Risk Assessment: Danger to Self:  No Self-injurious Behavior: No Danger to Others: No Duty to Warn:no Physical Aggression / Violence:No  Access to Firearms a concern: No  Gang Involvement:No   Subjective: Beginning the session, patient described herself as experiencing more fatigue and associates it with the cancer. Continuing to talk, she reflected on the limitations she experiences because of the cancer and how she is sad to be missing out on certain activities due to the cancer. She also voiced concern regarding being a burden to her daughters. From there, she talked about how she was looking forward to Christmas at the beach with her family. She identified activities she wants to do and worked through identifying ways to do those activities.  Specifically, she talked about having a golf cart for the beach, facetime to do activities with family, and fixing a meal with her family. She processed thoughts and emotions she was experiencing. She asked to follow up. She denied suicidal and homicidal ideation.    Interventions:  Worked on developing a therapeutic relationship with the patient using active listening and reflective statements. Provided emotional support using empathy and validation. Reviewed the treatment plan with the patient. Reviewed events since the intake. Normalized and validated expressed thoughts patient expressed related to the cancer. Reflected on the limitations patient is experiencing due to the cancer. Used socratic questions to assist the patient. Explored ways that patient could do activities with her family to celebrate Christmas. Processed emotions related to going to the beach. Explored what activities she wants to do at the beach. Assisted in problem solving. Processed how patient could participate in activities and not feel stuck by the cancer. Identified several themes and processed the emotions by those themes. Challenged some of the thoughts expressed. Assigned homework. Assessed for suicidal and homicidal ideation.  Homework: Discuss with daughters coming up with plan to do activities at the beach. Participate in family activities at the beach  Next Session: Review homework and emotional support  Diagnosis: F32.0 major depressive affective disorder, single episode, mild  Plan:   Client Abilities: Patient is friendly and easy to develop rapport.   Client Preferences: Patient voiced interest in ACT  Client statement of Needs: Patient stated that she wanted coping skills and to process her emotions.    Treatment Level: Patient would benefit  from outpatient therapy and monthly appointments. Depending on the severity of symptoms and prognosis potentially services more consistently.   Symptoms:  Patient  endorsed experiencing the following: rumination of negative thoughts, feeling sad, thoughts of hopelessness, social isolation, fatigue, avoiding pleasurable activities, and keeping to self. She denied suicidal and homicidal ideation.   Goals:     Work through the grieving process and face reality of own death Accept emotional support from others around them Live life to the fullest, event though time may be limited Become as knowledgeable about the medical condition  Reduce fear, anxiety about the health condition  Accept the illness  Objectives Identify feelings associated with the illness (12.1.2023) Family members share with each other feelings (12.1.2023) Identify the losses or limitations that have been experienced (12.1.2023) Verbalize acceptance of the reality of the medical condition (12.1.2023) Commit to learning and implement a proactive approach to managing personal stresses (12.1.2023) Work with therapist to develop a plan for coping with stress (12.1.12023) Engage in social, productive activities that are possible (12.1.2023) implement positive imagery (12.1.2023) Identify coping skills and sources of emotional support (12.1.2023) Patient's partner and family members verbalize their fears regarding severity of health condition (12.1.2023) Identify sources of emotional distress (12.1.2023) Learn and implement problem solving strategies (12.1.2023) Identify and engage in pleasant activities (12.1.2023)  Interventions Teach about stress and ways to handle stress Assist the patient in developing a coping action plan for stressors Conduct skills based training for coping strategies Train problem focused skills Sort out what activities the individual can do Encourage patient to rely upon his/her spiritual faith Teach the patient to use guided imagery Probe and evaluate family's ability to provide emotional support Allow family to share their fears Assist the patient in  identifying, sorting through, and verbalizing the various feelings generated by his/her medical condition Meet with family members  Ask patient list out limitations  Use stress inoculation training  Use Acceptance and Commitment Therapy to help client accept uncomfortable realities in order to accomplish value-consistent goals Reinforce the client's insight into the role of his/her past emotional pain and present anxiety  Discuss examples demonstrating that unrealistic worry overestimates the probability of threats and underestimates patient's ability  Assist the patient in analyzing his or her worries Help patient understand that avoidance is reinforcing  Behavioral activation  Help the client explore the relationship, nature of the dispute,  Help the client develop new interpersonal skills and relationships Conduct Problem solving therapy Teach conflict resolution skills Use a process-experiential approach Conduct TLDP Conduct ACT  The patient reviewed the treatment plan on 12.15.2023. The patient approved of the treatment plan.    Conception Chancy, PsyD

## 2021-06-02 NOTE — Progress Notes (Signed)
[place]  Telephone:([phone]) [phone] [phone]) [phone]   Name: Allison [name] Date: 06/02/2021 MRN: [id number]  DOB: 05/16/1951  Patient Care Team: [name], MD as PCP - General (Internal Medicine) Donato Heinz, MD as PCP - Cardiology (Cardiology) [name], MD as Consulting Physician (Radiation Oncology) Pickenpack-Cousar, Carlena Sax, NP as Nurse Practitioner (Nurse Practitioner)    INTERVAL HISTORY: Allison [name] is a 70 y.o. female with multiple medical problems including stage IV non-small cell lung cancer (12/2017), hypertension, CVA, CAD, DVT/PE (on Lovenox), and GERD.  Palliative ask to see for symptom management and goals of care.  SOCIAL HISTORY:     reports that she has never smoked. She has never used smokeless tobacco. She reports that she does not currently use alcohol. She reports that she does not use drugs.  ADVANCE DIRECTIVES:  None on file   CODE STATUS: DNR  PAST MEDICAL HISTORY: Past Medical History:  Diagnosis Date   Anemia    Anxiety    Arthritis    Asthma    exercise induced   Depression    PMH   Dyspnea    GERD (gastroesophageal reflux disease)    Glaucoma    History of radiation therapy 01/05/2021   IMRT right lung  11/24/2020-01/05/2021  Dr [name]   Hypertension    lung ca dx'd 11/2017   right   Malignant pleural effusion    right   PONV (postoperative nausea and vomiting)    Pre-diabetes    Raynaud's disease    Raynaud's disease    Stroke ([place]) 01/2021   balance off, some express aphasia, weakness     HEMATOLOGY/ONCOLOGY HISTORY:  Oncology History Overview Note  Patient presented with cough, congestion, SOB, fatigue, and wight loss.  Work up showed metastatic disease.    Adenocarcinoma of right lung, stage 4 ([place])  12/11/2017 Imaging   CT Chest IMPRESSION: 1. Large right pleural effusion with volume loss of much of the right lung. 2. Right upper lobe  spiculated mass of 2.3 x 2.9 cm suspicious for primary lung carcinoma. 3. Several small nodules throughout the right lung and left lung suspicious for metastases. Some of the nodules on the right are intimately associated with pleura and could represent pleural metastases. 4. Questionable soft tissue nodule in the left upper abdomen which could represent a metastatic lesion. 5. Mixed lesion within the T9 vertebral body. Cannot exclude a metastatic process.   12/19/2017 Procedure   Thoracentesis   12/26/2017 Imaging   PET IMPRESSION: 1. Hypermetabolic spiculated 3.3 cm apical right upper lobe lung mass, compatible with primary bronchogenic adenocarcinoma. 2. Hypermetabolic right hilar and right paratracheal lymphadenopathy. 3. Moderate dependent right pleural effusion with mild right pleural thickening, compatible with known malignant right pleural effusion. 4. Hypermetabolic 1.6 cm anterior right lower lobe nodule associated with the major fissure, compatible with pulmonary metastasis. Numerous additional subcentimeter pulmonary nodules scattered in both lungs, below PET resolution, suspicious for pulmonary metastases. Recommend attention on follow-up chest CT in 3 months   01/04/2018 Initial Diagnosis   Adenocarcinoma of right lung, stage 4 ([facility])   01/10/2018 Imaging   MRI Brain  No evidence of intracranial metastases   01/29/2018 -  Chemotherapy   Oral biologic Tagrisso p.o. 80 mg    08/16/2020 Cancer Staging   Staging form: Lung, AJCC 8th Edition - Clinical: Stage IVA Laney Pastor, cN2, cM1a) - Signed by Curt Bears, MD on 08/16/2020    03/17/2021 -  Chemotherapy   Patient is on Treatment Plan : LUNG NSCLC Pemetrexed + Carboplatin q21d x 4 Cycles       ALLERGIES:  is allergic to penicillins, vicodin [hydrocodone-acetaminophen], and other.  MEDICATIONS:  Current Outpatient Medications  Medication Sig Dispense Refill   acetaminophen (TYLENOL) 500 MG tablet Take 1,000 mg  by mouth every 6 (six) hours as needed (pain).     ALPRAZolam (XANAX) 0.25 MG tablet Take 1 tablet (0.25 mg total) by mouth 3 (three) times daily as needed for anxiety. 30 tablet 0   amLODipine (NORVASC) 5 MG tablet Take 5 mg by mouth daily.     aspirin 81 MG chewable tablet Chew 1 tablet (81 mg total) by mouth daily.     b complex vitamins capsule Take 1 capsule by mouth daily.     brompheniramine-pseudoephedrine-DM 30-2-10 MG/5ML syrup Take 5 mLs by mouth 3 (three) times daily as needed. 120 mL 0   carvedilol (COREG) 12.5 MG tablet Take 1 tablet (12.5 mg total) by mouth 2 (two) times daily with a meal. 180 tablet 3   cetirizine (ZYRTEC) 5 MG tablet Take 5 mg by mouth daily.     chlorthalidone (HYGROTON) 25 MG tablet Take 1 tablet (25 mg total) by mouth daily. 30 tablet 6   dexamethasone (DECADRON) 1 MG tablet Take 1 tablet (1 mg total) by mouth 2 (two) times daily as needed (For appetite stimulation). 30 tablet 1   dorzolamide-timolol (COSOPT) 22.3-6.8 MG/ML ophthalmic solution Place 1 drop into both eyes 2 (two) times daily.     enoxaparin (LOVENOX) 60 MG/0.6ML injection Inject 0.6 mLs (60 mg total) into the skin every 12 (twelve) hours. 60 mL 1   esomeprazole (NEXIUM) 20 MG capsule Take 1 capsule (20 mg total) by mouth in the morning and at bedtime. 60 capsule 2   ezetimibe (ZETIA) 10 MG tablet Take 10 mg by mouth daily.      FLUoxetine (PROZAC) 10 MG capsule Take 1 capsule (10 mg total) by mouth daily. 30 capsule 3   folic acid (FOLVITE) 1 MG tablet Take 1 tablet (1 mg total) by mouth daily. 30 tablet 4   guaiFENesin (MUCINEX) 600 MG 12 hr tablet Take 2 tablets (1,200 mg total) by mouth 2 (two) times daily. 30 tablet 10   ipratropium-albuterol (DUONEB) 0.5-2.5 (3) MG/3ML SOLN Take 3 mLs by nebulization every 6 (six) hours as needed (Asthma).     isosorbide mononitrate (IMDUR) 30 MG 24 hr tablet Take 1 tablet (30 mg total) by mouth daily. 90 tablet 3   latanoprost (XALATAN) 0.005 % ophthalmic  solution Place 1 drop into both eyes at bedtime.      lidocaine (LIDODERM) 5 % Place 1 patch onto the skin daily. Remove & Discard patch within 12 hours or as directed by MD 30 patch 0   morphine (MS CONTIN) 15 MG 12 hr tablet Take 1 tablet (15 mg total) by mouth every 12 (twelve) hours. 60 tablet 0   osimertinib mesylate (TAGRISSO) 80 MG tablet Take 1 tablet (80 mg total) by mouth daily. 30 tablet 3   OVER THE COUNTER MEDICATION Take 4 drops by mouth daily. Viatmin d     oxyCODONE (OXY IR/ROXICODONE) 5 MG immediate release tablet Take 1 tablet (5 mg total) by mouth every 4 (four) hours as needed for moderate pain. 30 tablet 0   potassium chloride SA (KLOR-CON M) 20 MEQ tablet Take 1 tablet (20 mEq total) by mouth daily. 5 tablet 0   prochlorperazine (  COMPAZINE) 10 MG tablet Take 1 tablet (10 mg total) by mouth every 6 (six) hours as needed for nausea or vomiting. 30 tablet 0   REPATHA SURECLICK 578 MG/ML SOAJ Inject 140 mg into the skin every 14 (fourteen) days. 6 mL 3   sucralfate (CARAFATE) 1 g tablet Take 1 tablet (1 g total) by mouth 4 (four) times daily. with meals and at bedtime. Crush and dissolve in 10 mL of warm water prior to swallowing 120 tablet 0   No current facility-administered medications for this visit.    VITAL SIGNS: BP 138/61 (BP Location: Left Arm, Patient Position: Sitting)    Pulse 87    Temp (!) 97.3 F (36.3 C) (Tympanic)    Resp 18    Wt 117 lb 2 oz (53.1 kg)    LMP  (LMP Unknown)    SpO2 100%    BMI 20.10 kg/m  Filed Weights   06/02/21 1130  Weight: 117 lb 2 oz (53.1 kg)    Estimated body mass index is 20.1 kg/m as calculated from the following:   Height as of 05/05/21: 5\' 4"  (1.626 m).   Weight as of this encounter: 117 lb 2 oz (53.1 kg).  LABS: CBC:    Component Value Date/Time   WBC 1.7 (L) 06/02/2021 1114   WBC 2.0 (L) 04/26/2021 0333   HGB 8.7 (L) 06/02/2021 1114   HGB 11.6 07/03/2019 1153   HCT 27.9 (L) 06/02/2021 1114   HCT 36.0 07/03/2019 1153    PLT 182 06/02/2021 1114   PLT 291 07/03/2019 1153   MCV 85.3 06/02/2021 1114   MCV 81 07/03/2019 1153   NEUTROABS 1.1 (L) 06/02/2021 1114   LYMPHSABS 0.4 (L) 06/02/2021 1114   MONOABS 0.2 06/02/2021 1114   EOSABS 0.0 06/02/2021 1114   BASOSABS 0.0 06/02/2021 1114   Comprehensive Metabolic Panel:    Component Value Date/Time   NA 139 06/02/2021 1114   NA 140 03/04/2021 1516   K 3.2 (L) 06/02/2021 1114   CL 103 06/02/2021 1114   CO2 25 06/02/2021 1114   BUN 22 06/02/2021 1114   BUN 21 03/04/2021 1516   CREATININE 1.08 (H) 06/02/2021 1114   GLUCOSE 98 06/02/2021 1114   CALCIUM 9.3 06/02/2021 1114   AST 50 (H) 06/02/2021 1114   ALT 75 (H) 06/02/2021 1114   ALKPHOS 69 06/02/2021 1114   BILITOT 0.4 06/02/2021 1114   PROT 7.1 06/02/2021 1114   PROT 7.1 11/01/2020 0839   ALBUMIN 3.3 (L) 06/02/2021 1114   ALBUMIN 4.6 11/01/2020 0839     PERFORMANCE STATUS (ECOG) : 1 - Symptomatic but completely ambulatory   Physical Exam General: NAD Cardiovascular: regular rate and rhythm Pulmonary: clear ant fields Abdomen: soft, nontender, + bowel sounds GU: no suprapubic tenderness Extremities: no edema, no joint deformities Skin: no rashes Neurological: Weakness but otherwise nonfocal  IMPRESSION:  Allison [name] presents to the clinic alone today.  She is ambulatory with a cane.  Looks much better compared to previous visit.  States she feels "much better".  No acute distress noted.  Fatigue  Endorses some fatigue around treatment schedule however is much appreciative that she is able to do more around her home and for herself over the past weeks.  Reports her fatigue has significantly decreased which she associates with her decreased anxiety and ability to sleep throughout the night.  She has been able to do some house chores and run some errands with the support of her family.  Anxiety Allison [name] shares her anxiety is much more controlled with the Xanax.  She is taking 2 at bedtime  which is allowing her to have a restful night and ability to function better the following day.  Does get somewhat anxious around treatment days however it is manageable at this time.  Discussed anxiousness/nervousness around designated days is most likely anticipatory.  She verbalized understanding and appreciation of ongoing support.  Appetite Appetite is improving.  She has maintained her current weight (117 pounds) compared to previous visit.  She has been following up with the nutritionist.  States she is drinking the Ensure as recommended.  States her appetite decreases around her treatment days however this is to be expected.  She will then increase her Ensure to supplement her loss of appetite.  Pain  Allison [name] denies pain.  She is not having to take previously prescribed pain medication.  We were able to get her OxyContin approved however if she is not needing this discussed there is no need to take.  States she has occasional chest tightness which she notices more when she eats certain foods or drinks cold liquids.  She describes a feeling of indigestion after certain foods which she has been taking over-the-counter antacids.  I did call her in Nexium however she states she has been unable to obtain.  She plans to follow-up with her pharmacy and notify us if additional authorization is required.  States she has intermittent abdominal cramping and sometimes diarrhea. She shared recent event while eating at a restaurant where she began having bad stomach cramping and sweats due to this requiring she and her family to leave early. EMS was called due to concerns and her inability to hardly walk but she was able to get home without going to the hospital. She feels these episodes are brought on by certain foods but have not narrowed them down directly.   I discussed the importance of continued conversation with family and their medical providers regarding overall plan of care and treatment options,  ensuring decisions are within the context of the patients values and GOCs.  PLAN: She has Oxycontin/Oxycodone in the home however pain is much improved. She is not requiring anything stronger than Tylenol at this time.  MiraLAX daily for bowel regimen Xanax as needed for anxiety.  This is much improved with 0.5mg  at bedtime. Much more functional and sleeping throughout the night.  Mucinex twice daily for congestion as needed. Feels this has also improved. Coughing has decreased.  Hyoscyamine for abdominal cramping as needed.  Ongoing discussions regarding appetite. Reports this has improved. No weight loss. Continues to work with dietician and focusing on increased protein intake.  Education provided on small frequent meals, high-protein foods, protein shakes, and Prosource. I will plan to see patient back in 2-4 weeks in collaboration with future oncology appointments.   Patient expressed understanding and was in agreement with this plan. She also understands that She can call the clinic at any time with any questions, concerns, or complaints.     Time Total: 35 min.   Visit consisted of counseling and education dealing with the complex and emotionally intense issues of symptom management and palliative care in the setting of serious and potentially life-threatening illness.Greater than 50%  of this time was spent counseling and coordinating care related to the above assessment and plan.  Signed by: Alda Lea, AGPCNP-BC Palliative Medicine Team

## 2021-06-02 NOTE — Telephone Encounter (Signed)
-----   Message from Curt Bears, MD sent at 06/02/2021  1:51 PM EST ----- Please encourage her to increase her potassium rich diet. ----- Message ----- From: Buel Ream, [facility] [place] Sent: 06/02/2021  11:27 AM EST To: Curt Bears, MD

## 2021-06-03 ENCOUNTER — Ambulatory Visit: Payer: Medicare HMO | Admitting: Psychologist

## 2021-06-03 ENCOUNTER — Other Ambulatory Visit: Payer: Self-pay | Admitting: *Deleted

## 2021-06-03 ENCOUNTER — Telehealth: Payer: Self-pay

## 2021-06-03 DIAGNOSIS — E876 Hypokalemia: Secondary | ICD-10-CM

## 2021-06-03 MED ORDER — POTASSIUM CHLORIDE CRYS ER 20 MEQ PO TBCR: 20.0000 meq | EXTENDED_RELEASE_TABLET | Freq: Every day | ORAL | 0 refills | Status: DC

## 2021-06-03 NOTE — Telephone Encounter (Signed)
Ms. Kesinger called to update our team that her prescriptions were able to be prepared at the pharmacy and that she will be able to pick them up this evening. Her insurance only approved one Nexium tablet a day and did not cover the Hyoscyamine but Ms. Freeburg said she would pay for this out of pocket as she had another episode yesterday of cramping and diarrhea after eating yogurt. Lexine Baton, NP notified. All concerns addressed. Advised to call back with any question/concerns.

## 2021-06-03 NOTE — Telephone Encounter (Signed)
Returned pt call in reference to call back from desk nurse. Advised pt to that her K+ was 3.2. Also advised to each K+ rich foods as well as K+ refill being sent to pharmacy. Pt verbalized understanding.

## 2021-06-04 LAB — ACID FAST CULTURE WITH REFLEXED SENSITIVITIES (MYCOBACTERIA)
Acid Fast Culture: NEGATIVE
Acid Fast Culture: NEGATIVE

## 2021-06-07 ENCOUNTER — Other Ambulatory Visit: Payer: Self-pay | Admitting: Internal Medicine

## 2021-06-08 DIAGNOSIS — C3491 Malignant neoplasm of unspecified part of right bronchus or lung: Secondary | ICD-10-CM | POA: Diagnosis not present

## 2021-06-08 DIAGNOSIS — R2689 Other abnormalities of gait and mobility: Secondary | ICD-10-CM | POA: Diagnosis not present

## 2021-06-08 DIAGNOSIS — E559 Vitamin D deficiency, unspecified: Secondary | ICD-10-CM | POA: Diagnosis not present

## 2021-06-08 DIAGNOSIS — Z0001 Encounter for general adult medical examination with abnormal findings: Secondary | ICD-10-CM | POA: Diagnosis not present

## 2021-06-08 DIAGNOSIS — I10 Essential (primary) hypertension: Secondary | ICD-10-CM | POA: Diagnosis not present

## 2021-06-08 DIAGNOSIS — R636 Underweight: Secondary | ICD-10-CM | POA: Diagnosis not present

## 2021-06-08 DIAGNOSIS — E785 Hyperlipidemia, unspecified: Secondary | ICD-10-CM | POA: Diagnosis not present

## 2021-06-08 DIAGNOSIS — R7309 Other abnormal glucose: Secondary | ICD-10-CM | POA: Diagnosis not present

## 2021-06-08 DIAGNOSIS — I2510 Atherosclerotic heart disease of native coronary artery without angina pectoris: Secondary | ICD-10-CM | POA: Diagnosis not present

## 2021-06-08 NOTE — Progress Notes (Signed)
[facility] TeleHEALTH VISIT PROGRESS NOTE   I connected with Allison [name] on 06/15/21 at 10:30 AM EST by telephone and verified that I am speaking with the correct person using two identifiers.  I discussed the limitations, risks, security and privacy concerns of performing an evaluation and management service by telemedicine and the availability of in-person appointments. I also discussed with the patient that there may be a patient responsible charge related to this service. The patient expressed understanding and agreed to proceed.  Other persons participating in the visit and their role in the encounter: None Patients location: Home Providers location: [place], [place], [place] [address]  DIAGNOSIS:  Stage IV (T2 a,N2, M1a) non-small cell lung cancer, adenocarcinoma diagnosed in July 2019 and presented with right upper lobe lung mass in addition to mediastinal lymphadenopathy as well as bilateral pulmonary nodules and malignant right pleural effusion.   Biomarker Findings Microsatellite status - Cannot Be Determined Tumor Mutational Burden - Cannot Be Determined Genomic Findings For a complete list of the genes assayed, please refer to the Appendix. EGFR exon 19 deletion ([id number]) TP53 Y220C 7 Disease relevant genes with no reportable alterations: KRAS, ALK, BRAF, MET, RET, ERBB2, ROS1   PRIOR THERAPY:  1) Status post right Pleurx catheter placement by Dr. Prescott Gum for drainage of malignant right pleural effusion. 2) palliative radiotherapy to the enlarging right upper lobe lung mass and mediastinum under the care of Dr. Sondra Come expected to be completed on January 05, 2021. 3) Tagrisso 80 mg p.o. daily.  First dose was given on 01/29/2018.  Status post 39 months of treatment.    CURRENT THERAPY: Systemic chemotherapy with carboplatin for AUC of 5 and Alimta 500 Mg/M2 every 3 weeks.  First dose  03/17/2021.  The patient will also continue her current treatment with Tagrisso 80 mg p.o. daily.  She is status post 4 cycles.   INTERVAL HISTORY: Allison [name] 70 y.o. female and I connected for a telephone encounter today. The patient is feeling well today without any concerning complaints. She has noticed improvement in her overall health over the last few weeks.  She is following closely with palliative care which has been very helpful for her.  She is currently on Xanax for her anxiety.  She also reports improvement in her appetite. She was taking decadron 1 mg BID PRN for appetite was prescribed during a prior hospitalization. Discussed at last appointment this is not standard for [place] to have this long term, the decadron premedication for her treatment is 4 mg BID the day before, the day of, and the day after chemotherapy. Dr. Julien Nordmann recommended she follow up with her PCP for refill of the 1 mg decadron for appetite if she is wishing to take it this way. She states she thinks it might be causing stomach upset so will take the 4 mg BID the day before, the day of, and the day after treatment. She would be willing to re-try the Marinol that was previously prescribed for appetite. She is not a good candidate for megace due to her CVA. She is taking Ensure as recommended by nutritionist team. She did lose weight in the interval due to diarrhea. The patient denies any pain except for occasional chest tightness which is managed with Tylenol.  She has a prescription for oxycodone and MS Contin but she has not needed to use this.  She occasionally has abdominal cramping for which she  is prescribed hyoscyamine and Nexium.  The patient reports she is more ambulatory than prior and is sleeping better. She previously told me she was going to undergo physical therapy by her primary care provider. She continues to have significant shortness of breath with minimal exertion although she does state that her overall  breathing is improved compared to prior. She continues to have a cough but states that she is producing less mucus than prior.  She has nebulizers and takes Mucinex twice a day. She states she has to stay on top of taking her nebulizer due to her cough. Of note she does have a loculated pleural effusion.  As expected, she does have some fatigue 2 to 3 days following treatment.  She denies any fever or chills. She sometimes has had diarrhea 4x in he interval. She had not tried taking imodium. She denies any constipation.  She denies any headaches. She is hoping to have cataract surgery later this month. She is wondering if with her platelet count of 107k last week if it would be ok to have this done. She is here today for evaluation and repeat blood work before starting cycle #5.   MEDICAL HISTORY: Past Medical History:  Diagnosis Date   Anemia    Anxiety    Arthritis    Asthma    exercise induced   Depression    PMH   Dyspnea    GERD (gastroesophageal reflux disease)    Glaucoma    History of radiation therapy 01/05/2021   IMRT right lung  11/24/2020-01/05/2021  Dr [name]   Hypertension    lung ca dx'd 11/2017   right   Malignant pleural effusion    right   PONV (postoperative nausea and vomiting)    Pre-diabetes    Raynaud's disease    Raynaud's disease    Stroke ([facility]) 01/2021   balance off, some express aphasia, weakness    ALLERGIES:  is allergic to penicillins, vicodin [hydrocodone-acetaminophen], and other.  MEDICATIONS:  Current Outpatient Medications  Medication Sig Dispense Refill   dexamethasone (DECADRON) 4 MG tablet Take 1 tablet twice a day the day before, the day of, and the day after chemotherapy 40 tablet 2   dronabinol (MARINOL) 2.5 MG capsule Take 1 capsule (2.5 mg total) by mouth 2 (two) times daily before a meal. 60 capsule 0   acetaminophen (TYLENOL) 500 MG tablet Take 1,000 mg by mouth every 6 (six) hours as needed (pain).     ALPRAZolam (XANAX) 0.25 MG  tablet Take 1 tablet (0.25 mg total) by mouth 3 (three) times daily as needed for anxiety. 30 tablet 0   amLODipine (NORVASC) 5 MG tablet Take 5 mg by mouth daily.     aspirin 81 MG chewable tablet Chew 1 tablet (81 mg total) by mouth daily.     b complex vitamins capsule Take 1 capsule by mouth daily.     brompheniramine-pseudoephedrine-DM 30-2-10 MG/5ML syrup Take 5 mLs by mouth 3 (three) times daily as needed. 120 mL 0   carvedilol (COREG) 12.5 MG tablet Take 1 tablet (12.5 mg total) by mouth 2 (two) times daily with a meal. 180 tablet 3   cetirizine (ZYRTEC) 5 MG tablet Take 5 mg by mouth daily.     chlorthalidone (HYGROTON) 25 MG tablet Take 1 tablet (25 mg total) by mouth daily. 30 tablet 6   dorzolamide-timolol (COSOPT) 22.3-6.8 MG/ML ophthalmic solution Place 1 drop into both eyes 2 (two) times daily.  enoxaparin (LOVENOX) 60 MG/0.6ML injection Inject 0.6 mLs (60 mg total) into the skin every 12 (twelve) hours. 60 mL 1   esomeprazole (NEXIUM) 20 MG capsule Take 1 capsule (20 mg total) by mouth in the morning and at bedtime. 60 capsule 2   ezetimibe (ZETIA) 10 MG tablet Take 10 mg by mouth daily.      FLUoxetine (PROZAC) 10 MG capsule Take 1 capsule (10 mg total) by mouth daily. 30 capsule 3   folic acid (FOLVITE) 1 MG tablet TAKE 1 TABLET BY MOUTH EVERY DAY 90 tablet 1   guaiFENesin (MUCINEX) 600 MG 12 hr tablet Take 2 tablets (1,200 mg total) by mouth 2 (two) times daily. 30 tablet 10   hyoscyamine (LEVSIN SL) 0.125 MG SL tablet Place 2 tablets (0.25 mg total) under the tongue every 6 (six) hours as needed for cramping. Take 1-2 tablets under the tongue every 6 hours as needed for cramping 30 tablet 0   ipratropium-albuterol (DUONEB) 0.5-2.5 (3) MG/3ML SOLN Take 3 mLs by nebulization every 6 (six) hours as needed (Asthma).     isosorbide mononitrate (IMDUR) 30 MG 24 hr tablet Take 1 tablet (30 mg total) by mouth daily. 90 tablet 3   latanoprost (XALATAN) 0.005 % ophthalmic solution  Place 1 drop into both eyes at bedtime.      lidocaine (LIDODERM) 5 % Place 1 patch onto the skin daily. Remove & Discard patch within 12 hours or as directed by MD 30 patch 0   osimertinib mesylate (TAGRISSO) 80 MG tablet Take 1 tablet (80 mg total) by mouth daily. 30 tablet 3   OVER THE COUNTER MEDICATION Take 4 drops by mouth daily. Viatmin d     oxyCODONE (OXY IR/ROXICODONE) 5 MG immediate release tablet Take 1 tablet (5 mg total) by mouth every 4 (four) hours as needed for moderate pain. 30 tablet 0   potassium chloride SA (KLOR-CON M) 20 MEQ tablet Take 1 tablet (20 mEq total) by mouth daily. 5 tablet 0   prochlorperazine (COMPAZINE) 10 MG tablet Take 1 tablet (10 mg total) by mouth every 6 (six) hours as needed for nausea or vomiting. 30 tablet 0   REPATHA SURECLICK 809 MG/ML SOAJ Inject 140 mg into the skin every 14 (fourteen) days. 6 mL 3   sucralfate (CARAFATE) 1 g tablet Take 1 tablet (1 g total) by mouth 4 (four) times daily. with meals and at bedtime. Crush and dissolve in 10 mL of warm water prior to swallowing 120 tablet 0   No current facility-administered medications for this visit.    SURGICAL HISTORY:  Past Surgical History:  Procedure Laterality Date   ABDOMINAL HYSTERECTOMY     partial   BRONCHIAL BIOPSY  04/21/2021   Procedure: BRONCHIAL BIOPSIES;  Surgeon: [name], DO;  Location: [facility];  Service: Pulmonary;;   BRONCHIAL BRUSHINGS  04/21/2021   Procedure: BRONCHIAL BRUSHINGS;  Surgeon: [name], DO;  Location: [facility];  Service: Pulmonary;;   BRONCHIAL NEEDLE ASPIRATION BIOPSY  04/21/2021   Procedure: BRONCHIAL NEEDLE ASPIRATION BIOPSIES;  Surgeon: [name], DO;  Location: [facility];  Service: Pulmonary;;   CHEST TUBE INSERTION Right 01/01/2018   Procedure: INSERTION PLEURAL DRAINAGE CATHETER;  Surgeon: Ivin Poot, MD;  Location: [facility];  Service: Thoracic;  Laterality: Right;   CHEST TUBE INSERTION  04/21/2021   Procedure:  CHEST TUBE INSERTION;  Surgeon: [name], DO;  Location: [facility];  Service: Pulmonary;;   COLONOSCOPY  CORONARY STENT INTERVENTION N/A 06/16/2019   Procedure: CORONARY STENT INTERVENTION;  Surgeon: Jettie Booze, MD;  Location: [facility];  Service: Cardiovascular;  Laterality: N/A;   DILATION AND CURETTAGE OF UTERUS     EYE SURGERY     due to Glaucoma   IR IMAGING GUIDED PORT INSERTION  03/22/2021   IR PORT REPAIR CENTRAL VENOUS ACCESS DEVICE  04/15/2021   LEFT HEART CATH AND CORONARY ANGIOGRAPHY N/A 06/16/2019   Procedure: LEFT HEART CATH AND CORONARY ANGIOGRAPHY;  Surgeon: Jettie Booze, MD;  Location: [facility];  Service: Cardiovascular;  Laterality: N/A;   REMOVAL OF PLEURAL DRAINAGE CATHETER Right 11/07/2018   Procedure: REMOVAL OF PLEURAL DRAINAGE CATHETER;  Surgeon: Ivin Poot, MD;  Location: [facility];  Service: Thoracic;  Laterality: Right;   ROTATOR CUFF REPAIR     TUBAL LIGATION     VIDEO BRONCHOSCOPY WITH ENDOBRONCHIAL NAVIGATION Bilateral 04/21/2021   Procedure: VIDEO BRONCHOSCOPY WITH ENDOBRONCHIAL NAVIGATION;  Surgeon: [name], DO;  Location: [facility];  Service: Pulmonary;  Laterality: Bilateral;  ION   WISDOM TOOTH EXTRACTION      REVIEW OF SYSTEMS:   Constitutional: Positive for fatigue 2 to 3 days following treatment.  Positive for improving appetite. Positive for weight loss. Negative for chills and fever. HENT: Negative for mouth sores, nosebleeds, sore throat and trouble swallowing.   Eyes: Negative for eye problems and icterus.  Respiratory: Positive for dyspnea on exertion and improving but persistent cough.  Negative for hemoptysis and wheezing.   Cardiovascular: Positive for occasional chest tightness.  Negative for leg swelling.  Gastrointestinal: Positive for intermittent diarrhea. Positive for mild nausea without vomiting. Negative for abdominal pain, constipation, and vomiting.  Genitourinary: Negative  for bladder incontinence, difficulty urinating, dysuria, frequency and hematuria.   Musculoskeletal: Negative for back pain, gait problem, neck pain and neck stiffness.  Skin: Negative for itching and rash.  Neurological: Negative for dizziness, extremity weakness, gait problem, headaches, light-headedness and seizures.  Hematological: Negative for adenopathy. Does not bruise/bleed easily.  Psychiatric/Behavioral: Negative for confusion, depression and sleep disturbance. The patient is not nervous/anxious.     PHYSICAL EXAMINATION:  There were no vitals taken for this visit.  ECOG PERFORMANCE STATUS: 1  Physical Exam  Psychiatric: Mood, memory and judgment normal.  Vitals reviewed.  LABORATORY DATA: Lab Results  Component Value Date   WBC 2.6 (L) 06/09/2021   HGB 8.3 (L) 06/09/2021   HCT 26.5 (L) 06/09/2021   MCV 86.3 06/09/2021   PLT 107 (L) 06/09/2021      Chemistry      Component Value Date/Time   NA 138 06/09/2021 1045   NA 140 03/04/2021 1516   K 3.2 (L) 06/09/2021 1045   CL 102 06/09/2021 1045   CO2 26 06/09/2021 1045   BUN 18 06/09/2021 1045   BUN 21 03/04/2021 1516   CREATININE 1.05 (H) 06/09/2021 1045      Component Value Date/Time   CALCIUM 9.7 06/09/2021 1045   ALKPHOS 62 06/09/2021 1045   AST 25 06/09/2021 1045   ALT 32 06/09/2021 1045   BILITOT 0.2 (L) 06/09/2021 1045       RADIOGRAPHIC STUDIES:  No results found.   ASSESSMENT/PLAN:  This is a very pleasant 70 year old never smoker African-American female diagnosed with stage IV non-small cell lung cancer, adenocarcinoma.  She was positive for an EGFR mutation with deletion in exon 19.  She was diagnosed in July 2019 and presented with right upper  lobe lung mass in addition to mediastinal lymphadenopathy as well as bilateral pulmonary nodules and malignant right pleural effusion.   The patient was started on treatment with Tagrisso 80 mg p.o. daily status post 39 months of treatment. Started on  01/29/2018.   In April 2022, she showed evidence of disease progression with interval progression of the right apical lung mass in addition to progression of mediastinal lymphadenopathy concerning for worsening of her disease. She had repeat Guardant 360 molecular studies which did not show evidence of new resistant mutations.  Therefore, she was referred to radiation oncology and completed palliative radiotherapy to the enlarging right upper lobe lung mass and mediastinum under the care of Dr. Sondra Come.  This was completed in July 2022.   Unfortunately, the patient recently was found to have evidence of disease progression.  Therefore, Dr. Julien Nordmann started the patient on systemic chemotherapy with carboplatin for AUC of 5 and Alimta 500 mg per metered squared.  She is status post 3 cycles and she tolerated it fairly well despite her ongoing issues with fatigue, chest tightness, cough, and weight loss.  She is not a good candidate for Avastin due to her recent CVA in August 2022.  She also is continuing to take Tagrisso as it is protective against progressive metastatic disease to the brain.   The patient underwent a repeat bronchoscopy and biopsy for repeat molecular testing.  The sample from the left and right lung biopsy was negative for malignancy. This was performed in November 2022.    The patient saw Dr. Durenda Hurt from [facility] for second opinion in November 2022.  They discussed if she progresses on chemotherapy that there may be upcoming options. He discussed other treatment options which may be available in 2023 including patritumab deruxtcan and lazertinib/amivantmab. If these are not available, he wrote that they can determine if they can get the agents through an expanded access, single patient IND, or clinical trial if she has progression with chemotherapy.     Recommend that she proceed with cycle #5 Friday 06/17/21 as scheduled as long as her labs are adequate for treatment.   We  will see her back for follow-up visit in 3 weeks for evaluation before undergoing cycle #6. This is scheduled for 06/30/21.    She was prescribed 1 mg of Decadron during her prior hospital admission which she believes is helping her appetite.  Dr. Julien Nordmann previously stated that she either can take our Decadron premedication or take the 1 mg Decadron tablet as prescribed by her other provider, but she will need to reach out to her PCP for refills as this is not standard for [place]. She now would like to take her premedications with decadron 4 mg BID the day before, the day of, and the day after chemotherapy. I have discontinued her 1 mg decadron. I will refill her marinol for appetite.    She will continue using her nebulizers and Mucinex for her cough and shortness of breath.  The patient was given a prescription cough medication by palliative care but she states she has not needed to use this at this time.   She will continue taking Tylenol for pain (chest tightness), but she does have other prescription pain medications if needed.  The patient knows that she has these if needed but she mentally does not like knowing/taking opioid medications.   She will continue taking Xanax for her anxiety as prescribed by palliative care.  Advised to please use imodium if she  has diarrhea.   Discussed it is ok to have cataract surgery but would recommend that she has this performed on her off week of treatment.   I discussed the assessment and treatment plan with the patient. The patient was provided an opportunity to ask questions and all were answered. The patient agreed with the plan and demonstrated an understanding of the instructions.  The patient was advised to call back or seek an in-person evaluation if the symptoms worsen or if the condition fails to improve as anticipated.    I provided 20-29 minutes of non face-to-face telephone visit time during this encounter, and > 50% was spent counseling as  documented under my assessment & plan.  Cassandra L Heilingoetter, PA-C 06/15/2021 11:22 AM  No orders of the defined types were placed in this encounter.    Cassandra L Heilingoetter, PA-C 06/15/21

## 2021-06-09 ENCOUNTER — Other Ambulatory Visit: Payer: Self-pay

## 2021-06-09 ENCOUNTER — Inpatient Hospital Stay: Payer: Medicare HMO

## 2021-06-09 DIAGNOSIS — I10 Essential (primary) hypertension: Secondary | ICD-10-CM | POA: Diagnosis not present

## 2021-06-09 DIAGNOSIS — Z5111 Encounter for antineoplastic chemotherapy: Secondary | ICD-10-CM | POA: Diagnosis not present

## 2021-06-09 DIAGNOSIS — R69 Illness, unspecified: Secondary | ICD-10-CM | POA: Diagnosis not present

## 2021-06-09 DIAGNOSIS — C3491 Malignant neoplasm of unspecified part of right bronchus or lung: Secondary | ICD-10-CM

## 2021-06-09 DIAGNOSIS — E559 Vitamin D deficiency, unspecified: Secondary | ICD-10-CM | POA: Diagnosis not present

## 2021-06-09 DIAGNOSIS — Z95828 Presence of other vascular implants and grafts: Secondary | ICD-10-CM

## 2021-06-09 DIAGNOSIS — K219 Gastro-esophageal reflux disease without esophagitis: Secondary | ICD-10-CM | POA: Diagnosis not present

## 2021-06-09 DIAGNOSIS — R7309 Other abnormal glucose: Secondary | ICD-10-CM | POA: Diagnosis not present

## 2021-06-09 DIAGNOSIS — I2510 Atherosclerotic heart disease of native coronary artery without angina pectoris: Secondary | ICD-10-CM | POA: Diagnosis not present

## 2021-06-09 DIAGNOSIS — C3411 Malignant neoplasm of upper lobe, right bronchus or lung: Secondary | ICD-10-CM | POA: Diagnosis not present

## 2021-06-09 DIAGNOSIS — E785 Hyperlipidemia, unspecified: Secondary | ICD-10-CM | POA: Diagnosis not present

## 2021-06-09 DIAGNOSIS — Z923 Personal history of irradiation: Secondary | ICD-10-CM | POA: Diagnosis not present

## 2021-06-09 DIAGNOSIS — R5383 Other fatigue: Secondary | ICD-10-CM | POA: Diagnosis not present

## 2021-06-09 DIAGNOSIS — J45909 Unspecified asthma, uncomplicated: Secondary | ICD-10-CM | POA: Diagnosis not present

## 2021-06-09 LAB — CMP (CANCER CENTER ONLY)
ALT: 32 U/L (ref 0–44)
AST: 25 U/L (ref 15–41)
Albumin: 3.9 g/dL (ref 3.5–5.0)
Alkaline Phosphatase: 62 U/L (ref 38–126)
Anion gap: 10 (ref 5–15)
BUN: 18 mg/dL (ref 8–23)
CO2: 26 mmol/L (ref 22–32)
Calcium: 9.7 mg/dL (ref 8.9–10.3)
Chloride: 102 mmol/L (ref 98–111)
Creatinine: 1.05 mg/dL — ABNORMAL HIGH (ref 0.44–1.00)
GFR, Estimated: 57 mL/min — ABNORMAL LOW (ref >60)
Glucose, Bld: 83 mg/dL (ref 70–99)
Potassium: 3.2 mmol/L — ABNORMAL LOW (ref 3.5–5.1)
Sodium: 138 mmol/L (ref 135–145)
Total Bilirubin: 0.2 mg/dL — ABNORMAL LOW (ref 0.3–1.2)
Total Protein: 7.1 g/dL (ref 6.5–8.1)

## 2021-06-09 LAB — CBC WITH DIFFERENTIAL (CANCER CENTER ONLY)
Abs Immature Granulocytes: 0.01 10*3/uL (ref 0.00–0.07)
Basophils Absolute: 0 10*3/uL (ref 0.0–0.1)
Basophils Relative: 0 %
Eosinophils Absolute: 0.1 10*3/uL (ref 0.0–0.5)
Eosinophils Relative: 2 %
HCT: 26.5 % — ABNORMAL LOW (ref 36.0–46.0)
Hemoglobin: 8.3 g/dL — ABNORMAL LOW (ref 12.0–15.0)
Immature Granulocytes: 0 %
Lymphocytes Relative: 26 %
Lymphs Abs: 0.7 10*3/uL (ref 0.7–4.0)
MCH: 27 pg (ref 26.0–34.0)
MCHC: 31.3 g/dL (ref 30.0–36.0)
MCV: 86.3 fL (ref 80.0–100.0)
Monocytes Absolute: 0.5 10*3/uL (ref 0.1–1.0)
Monocytes Relative: 18 %
Neutro Abs: 1.4 10*3/uL — ABNORMAL LOW (ref 1.7–7.7)
Neutrophils Relative %: 54 %
Platelet Count: 107 10*3/uL — ABNORMAL LOW (ref 150–400)
RBC: 3.07 MIL/uL — ABNORMAL LOW (ref 3.87–5.11)
RDW: 19.6 % — ABNORMAL HIGH (ref 11.5–15.5)
WBC Count: 2.6 10*3/uL — ABNORMAL LOW (ref 4.0–10.5)
nRBC: 0 % (ref 0.0–0.2)

## 2021-06-09 LAB — SAMPLE TO BLOOD BANK

## 2021-06-09 MED ORDER — HEPARIN SOD (PORK) LOCK FLUSH 100 UNIT/ML IV SOLN
500.0000 [IU] | Freq: Once | INTRAVENOUS | Status: AC
  Administered 2021-06-09: 11:00:00 500 [IU] via INTRAVENOUS

## 2021-06-09 MED ORDER — SODIUM CHLORIDE 0.9% FLUSH
10.0000 mL | INTRAVENOUS | Status: DC | PRN
  Administered 2021-06-09: 11:00:00 10 mL via INTRAVENOUS

## 2021-06-15 ENCOUNTER — Inpatient Hospital Stay (HOSPITAL_BASED_OUTPATIENT_CLINIC_OR_DEPARTMENT_OTHER): Payer: Medicare HMO | Admitting: Physician Assistant

## 2021-06-15 ENCOUNTER — Telehealth: Payer: Medicare HMO | Admitting: Physician Assistant

## 2021-06-15 ENCOUNTER — Encounter: Payer: Self-pay | Admitting: Internal Medicine

## 2021-06-15 ENCOUNTER — Ambulatory Visit: Payer: Medicare HMO | Admitting: Physician Assistant

## 2021-06-15 DIAGNOSIS — C3491 Malignant neoplasm of unspecified part of right bronchus or lung: Secondary | ICD-10-CM

## 2021-06-15 DIAGNOSIS — Z5111 Encounter for antineoplastic chemotherapy: Secondary | ICD-10-CM

## 2021-06-15 MED ORDER — DEXAMETHASONE 4 MG PO TABS: ORAL_TABLET | ORAL | 2 refills | Status: DC

## 2021-06-15 MED ORDER — DRONABINOL 2.5 MG PO CAPS
2.5000 mg | ORAL_CAPSULE | Freq: Two times a day (BID) | ORAL | 0 refills | Status: DC
Start: 2021-06-15 — End: 2021-07-07

## 2021-06-16 ENCOUNTER — Inpatient Hospital Stay: Payer: Medicare HMO

## 2021-06-16 ENCOUNTER — Other Ambulatory Visit: Payer: Self-pay | Admitting: Cardiology

## 2021-06-16 ENCOUNTER — Other Ambulatory Visit: Payer: Self-pay | Admitting: Physician Assistant

## 2021-06-16 DIAGNOSIS — C3491 Malignant neoplasm of unspecified part of right bronchus or lung: Secondary | ICD-10-CM

## 2021-06-17 ENCOUNTER — Inpatient Hospital Stay: Payer: Medicare HMO

## 2021-06-17 ENCOUNTER — Other Ambulatory Visit: Payer: Self-pay

## 2021-06-17 ENCOUNTER — Other Ambulatory Visit: Payer: Self-pay | Admitting: Hematology and Oncology

## 2021-06-17 VITALS — BP 134/66 | HR 65 | Temp 98.0°F | Resp 18 | Wt 117.8 lb

## 2021-06-17 DIAGNOSIS — Z5111 Encounter for antineoplastic chemotherapy: Secondary | ICD-10-CM | POA: Diagnosis not present

## 2021-06-17 DIAGNOSIS — J45909 Unspecified asthma, uncomplicated: Secondary | ICD-10-CM | POA: Diagnosis not present

## 2021-06-17 DIAGNOSIS — C3411 Malignant neoplasm of upper lobe, right bronchus or lung: Secondary | ICD-10-CM | POA: Diagnosis not present

## 2021-06-17 DIAGNOSIS — C3491 Malignant neoplasm of unspecified part of right bronchus or lung: Secondary | ICD-10-CM

## 2021-06-17 DIAGNOSIS — Z923 Personal history of irradiation: Secondary | ICD-10-CM | POA: Diagnosis not present

## 2021-06-17 DIAGNOSIS — I10 Essential (primary) hypertension: Secondary | ICD-10-CM | POA: Diagnosis not present

## 2021-06-17 DIAGNOSIS — Z95828 Presence of other vascular implants and grafts: Secondary | ICD-10-CM

## 2021-06-17 DIAGNOSIS — R69 Illness, unspecified: Secondary | ICD-10-CM | POA: Diagnosis not present

## 2021-06-17 DIAGNOSIS — K219 Gastro-esophageal reflux disease without esophagitis: Secondary | ICD-10-CM | POA: Diagnosis not present

## 2021-06-17 DIAGNOSIS — R5383 Other fatigue: Secondary | ICD-10-CM | POA: Diagnosis not present

## 2021-06-17 DIAGNOSIS — I2510 Atherosclerotic heart disease of native coronary artery without angina pectoris: Secondary | ICD-10-CM | POA: Diagnosis not present

## 2021-06-17 LAB — CBC WITH DIFFERENTIAL (CANCER CENTER ONLY)
Abs Immature Granulocytes: 0.02 10*3/uL (ref 0.00–0.07)
Basophils Absolute: 0 10*3/uL (ref 0.0–0.1)
Basophils Relative: 0 %
Eosinophils Absolute: 0 10*3/uL (ref 0.0–0.5)
Eosinophils Relative: 0 %
HCT: 30 % — ABNORMAL LOW (ref 36.0–46.0)
Hemoglobin: 9.5 g/dL — ABNORMAL LOW (ref 12.0–15.0)
Immature Granulocytes: 0 %
Lymphocytes Relative: 10 %
Lymphs Abs: 0.5 10*3/uL — ABNORMAL LOW (ref 0.7–4.0)
MCH: 28.1 pg (ref 26.0–34.0)
MCHC: 31.7 g/dL (ref 30.0–36.0)
MCV: 88.8 fL (ref 80.0–100.0)
Monocytes Absolute: 0.3 10*3/uL (ref 0.1–1.0)
Monocytes Relative: 7 %
Neutro Abs: 3.8 10*3/uL (ref 1.7–7.7)
Neutrophils Relative %: 83 %
Platelet Count: 214 10*3/uL (ref 150–400)
RBC: 3.38 MIL/uL — ABNORMAL LOW (ref 3.87–5.11)
RDW: 20.2 % — ABNORMAL HIGH (ref 11.5–15.5)
WBC Count: 4.6 10*3/uL (ref 4.0–10.5)
nRBC: 0 % (ref 0.0–0.2)

## 2021-06-17 LAB — SAMPLE TO BLOOD BANK

## 2021-06-17 LAB — CMP (CANCER CENTER ONLY)
ALT: 25 U/L (ref 0–44)
AST: 20 U/L (ref 15–41)
Albumin: 4 g/dL (ref 3.5–5.0)
Alkaline Phosphatase: 49 U/L (ref 38–126)
Anion gap: 11 (ref 5–15)
BUN: 23 mg/dL (ref 8–23)
CO2: 26 mmol/L (ref 22–32)
Calcium: 9.6 mg/dL (ref 8.9–10.3)
Chloride: 99 mmol/L (ref 98–111)
Creatinine: 1.04 mg/dL — ABNORMAL HIGH (ref 0.44–1.00)
GFR, Estimated: 58 mL/min — ABNORMAL LOW (ref >60)
Glucose, Bld: 175 mg/dL — ABNORMAL HIGH (ref 70–99)
Potassium: 3 mmol/L — ABNORMAL LOW (ref 3.5–5.1)
Sodium: 136 mmol/L (ref 135–145)
Total Bilirubin: 0.2 mg/dL — ABNORMAL LOW (ref 0.3–1.2)
Total Protein: 7.3 g/dL (ref 6.5–8.1)

## 2021-06-17 MED ORDER — SODIUM CHLORIDE 0.9 % IV SOLN
500.0000 mg/m2 | Freq: Once | INTRAVENOUS | Status: AC
  Administered 2021-06-17: 13:00:00 800 mg via INTRAVENOUS
  Filled 2021-06-17: qty 20

## 2021-06-17 MED ORDER — HEPARIN SOD (PORK) LOCK FLUSH 100 UNIT/ML IV SOLN
500.0000 [IU] | Freq: Once | INTRAVENOUS | Status: AC | PRN
  Administered 2021-06-17: 14:00:00 500 [IU]

## 2021-06-17 MED ORDER — SODIUM CHLORIDE 0.9% FLUSH
10.0000 mL | INTRAVENOUS | Status: DC | PRN
  Administered 2021-06-17: 14:00:00 10 mL

## 2021-06-17 MED ORDER — SODIUM CHLORIDE 0.9% FLUSH
10.0000 mL | INTRAVENOUS | Status: DC | PRN
  Administered 2021-06-17: 10:00:00 10 mL via INTRAVENOUS

## 2021-06-17 MED ORDER — SODIUM CHLORIDE 0.9 % IV SOLN
Freq: Once | INTRAVENOUS | Status: AC
Start: 2021-06-17 — End: 2021-06-17

## 2021-06-17 MED ORDER — PALONOSETRON HCL INJECTION 0.25 MG/5ML
0.2500 mg | Freq: Once | INTRAVENOUS | Status: AC
  Administered 2021-06-17: 12:00:00 0.25 mg via INTRAVENOUS
  Filled 2021-06-17: qty 5

## 2021-06-17 MED ORDER — SODIUM CHLORIDE 0.9 % IV SOLN
150.0000 mg | Freq: Once | INTRAVENOUS | Status: AC
  Administered 2021-06-17: 12:00:00 150 mg via INTRAVENOUS
  Filled 2021-06-17: qty 5

## 2021-06-17 MED ORDER — SODIUM CHLORIDE 0.9 % IV SOLN
350.5000 mg | Freq: Once | INTRAVENOUS | Status: AC
  Administered 2021-06-17: 13:00:00 350 mg via INTRAVENOUS
  Filled 2021-06-17: qty 35

## 2021-06-17 MED ORDER — SODIUM CHLORIDE 0.9 % IV SOLN
10.0000 mg | Freq: Once | INTRAVENOUS | Status: AC
  Administered 2021-06-17: 12:00:00 10 mg via INTRAVENOUS
  Filled 2021-06-17: qty 1

## 2021-06-17 NOTE — Patient Instructions (Addendum)
[facility]   Discharge Instructions: Thank you for choosing [facility] to provide your oncology and hematology care.   If you have a lab appointment with the [facility], please go directly to the [facility] and check in at the registration area.   Wear comfortable clothing and clothing appropriate for easy access to any Portacath or PICC line.   We strive to give you quality time with your provider. You may need to reschedule your appointment if you arrive late (15 or more minutes).  Arriving late affects you and other patients whose appointments are after yours.  Also, if you miss three or more appointments without notifying the office, you may be dismissed from the clinic at the providers discretion.      For prescription refill requests, have your pharmacy contact our office and allow 72 hours for refills to be completed.    Today you received the following chemotherapy and/or immunotherapy agents Alimta, carboplatinPemetrexed injection What is this medication? PEMETREXED (PEM e TREX ed) is a chemotherapy drug used to treat lung cancers like non-small cell lung cancer and mesothelioma. It may also be used to treat other cancers. This medicine may be used for other purposes; ask your health care provider or pharmacist if you have questions. COMMON BRAND NAME(S): Alimta, PEMFEXY What should I tell my care team before I take this medication? They need to know if you have any of these conditions: infection (especially a virus infection such as chickenpox, cold sores, or herpes) kidney disease low blood counts, like low white cell, platelet, or red cell counts lung or breathing disease, like asthma radiation therapy an unusual or allergic reaction to pemetrexed, other medicines, foods, dyes, or preservative pregnant or trying to get pregnant breast-feeding How should I use this medication? This drug is given as an infusion into a vein. It  is administered in a hospital or clinic by a specially trained health care professional. Talk to your pediatrician regarding the use of this medicine in children. Special care may be needed. Overdosage: If you think you have taken too much of this medicine contact a poison control center or emergency room at once. NOTE: This medicine is only for you. Do not share this medicine with others. What if I miss a dose? It is important not to miss your dose. Call your doctor or health care professional if you are unable to keep an appointment. What may interact with this medication? This medicine may interact with the following medications: Ibuprofen This list may not describe all possible interactions. Give your health care provider a list of all the medicines, herbs, non-prescription drugs, or dietary supplements you use. Also tell them if you smoke, drink alcohol, or use illegal drugs. Some items may interact with your medicine. What should I watch for while using this medication? Visit your doctor for checks on your progress. This drug may make you feel generally unwell. This is not uncommon, as chemotherapy can affect healthy cells as well as cancer cells. Report any side effects. Continue your course of treatment even though you feel ill unless your doctor tells you to stop. In some cases, you may be given additional medicines to help with side effects. Follow all directions for their use. Call your doctor or health care professional for advice if you get a fever, chills or sore throat, or other symptoms of a cold or flu. Do not treat yourself. This drug decreases your body's ability to fight  infections. Try to avoid being around people who are sick. This medicine may increase your risk to bruise or bleed. Call your doctor or health care professional if you notice any unusual bleeding. Be careful brushing and flossing your teeth or using a toothpick because you may get an infection or bleed more easily.  If you have any dental work done, tell your dentist you are receiving this medicine. Avoid taking products that contain aspirin, acetaminophen, ibuprofen, naproxen, or ketoprofen unless instructed by your doctor. These medicines may hide a fever. Call your doctor or health care professional if you get diarrhea or mouth sores. Do not treat yourself. To protect your kidneys, drink water or other fluids as directed while you are taking this medicine. Do not become pregnant while taking this medicine or for 6 months after stopping it. Women should inform their doctor if they wish to become pregnant or think they might be pregnant. Men should not father a child while taking this medicine and for 3 months after stopping it. This may interfere with the ability to father a child. You should talk to your doctor or health care professional if you are concerned about your fertility. There is a potential for serious side effects to an unborn child. Talk to your health care professional or pharmacist for more information. Do not breast-feed an infant while taking this medicine or for 1 week after stopping it. What side effects may I notice from receiving this medication? Side effects that you should report to your doctor or health care professional as soon as possible: allergic reactions like skin rash, itching or hives, swelling of the face, lips, or tongue breathing problems redness, blistering, peeling or loosening of the skin, including inside the mouth signs and symptoms of bleeding such as bloody or black, tarry stools; red or dark-brown urine; spitting up blood or brown material that looks like coffee grounds; red spots on the skin; unusual bruising or bleeding from the eye, gums, or nose signs and symptoms of infection like fever or chills; cough; sore throat; pain or trouble passing urine signs and symptoms of kidney injury like trouble passing urine or change in the amount of urine signs and symptoms of  liver injury like dark yellow or brown urine; general ill feeling or flu-like symptoms; light-colored stools; loss of appetite; nausea; right upper belly pain; unusually weak or tired; yellowing of the eyes or skin Side effects that usually do not require medical attention (report to your doctor or health care professional if they continue or are bothersome): constipation mouth sores nausea, vomiting unusually weak or tired This list may not describe all possible side effects. Call your doctor for medical advice about side effects. You may report side effects to FDA at 1-800-FDA-1088. Where should I keep my medication? This drug is given in a hospital or clinic and will not be stored at home. NOTE: This sheet is a summary. It may not cover all possible information. If you have questions about this medicine, talk to your doctor, pharmacist, or health care provider.  2022 Elsevier/Gold Standard (2017-07-31 00:00:00)       To help prevent nausea and vomiting after your treatment, we encourage you to take your nausea medication as directed.  BELOW ARE SYMPTOMS THAT SHOULD BE REPORTED IMMEDIATELY: *FEVER GREATER THAN 100.4 F (38 C) OR HIGHER *CHILLS OR SWEATING *NAUSEA AND VOMITING THAT IS NOT CONTROLLED WITH YOUR NAUSEA MEDICATION *UNUSUAL SHORTNESS OF BREATH *UNUSUAL BRUISING OR BLEEDING *URINARY PROBLEMS (pain or burning when  urinating, or frequent urination) *BOWEL PROBLEMS (unusual diarrhea, constipation, pain near the anus) TENDERNESS IN MOUTH AND THROAT WITH OR WITHOUT PRESENCE OF ULCERS (sore throat, sores in mouth, or a toothache) UNUSUAL RASH, SWELLING OR PAIN  UNUSUAL VAGINAL DISCHARGE OR ITCHING   Items with * indicate a potential emergency and should be followed up as soon as possible or go to the Emergency Department if any problems should occur.  Please show the CHEMOTHERAPY ALERT CARD or IMMUNOTHERAPY ALERT CARD at check-in to the Emergency Department and triage  nurse.  Should you have questions after your visit or need to cancel or reschedule your appointment, please contact [facility]  Dept: [phone]  and follow the prompts.  Office hours are 8:00 a.m. to 4:30 p.m. Monday - Friday. Please note that voicemails left after 4:00 p.m. may not be returned until the following business day.  We are closed weekends and major holidays. You have access to a nurse at all times for urgent questions. Please call the main number to the clinic Dept: [phone] and follow the prompts.   For any non-urgent questions, you may also contact your provider using MyChart. We now offer e-Visits for anyone 32 and older to request care online for non-urgent symptoms. For details visit [email or web address].   Also download the MyChart app! Go to the app store, search "MyChart", open the app, select [facility], and log in with your MyChart username and password.  Due to Covid, a mask is required upon entering the hospital/clinic. If you do not have a mask, one will be given to you upon arrival. For doctor visits, patients may have 1 support person aged 54 or older with them. For treatment visits, patients cannot have anyone with them due to current Covid guidelines and our immunocompromised population.   Carboplatin injection What is this medication? CARBOPLATIN (KAR boe pla tin) is a chemotherapy drug. It targets fast dividing cells, like cancer cells, and causes these cells to die. This medicine is used to treat ovarian cancer and many other cancers. This medicine may be used for other purposes; ask your health care provider or pharmacist if you have questions. COMMON BRAND NAME(S): Paraplatin What should I tell my care team before I take this medication? They need to know if you have any of these conditions: blood disorders hearing problems kidney disease recent or ongoing radiation therapy an unusual or allergic reaction to carboplatin,  cisplatin, other chemotherapy, other medicines, foods, dyes, or preservatives pregnant or trying to get pregnant breast-feeding How should I use this medication? This drug is usually given as an infusion into a vein. It is administered in a hospital or clinic by a specially trained health care professional. Talk to your pediatrician regarding the use of this medicine in children. Special care may be needed. Overdosage: If you think you have taken too much of this medicine contact a poison control center or emergency room at once. NOTE: This medicine is only for you. Do not share this medicine with others. What if I miss a dose? It is important not to miss a dose. Call your doctor or health care professional if you are unable to keep an appointment. What may interact with this medication? medicines for seizures medicines to increase blood counts like filgrastim, pegfilgrastim, sargramostim some antibiotics like amikacin, gentamicin, neomycin, streptomycin, tobramycin vaccines Talk to your doctor or health care professional before taking any of these medicines: acetaminophen aspirin ibuprofen ketoprofen naproxen This list may not describe  all possible interactions. Give your health care provider a list of all the medicines, herbs, non-prescription drugs, or dietary supplements you use. Also tell them if you smoke, drink alcohol, or use illegal drugs. Some items may interact with your medicine. What should I watch for while using this medication? Your condition will be monitored carefully while you are receiving this medicine. You will need important blood work done while you are taking this medicine. This drug may make you feel generally unwell. This is not uncommon, as chemotherapy can affect healthy cells as well as cancer cells. Report any side effects. Continue your course of treatment even though you feel ill unless your doctor tells you to stop. In some cases, you may be given additional  medicines to help with side effects. Follow all directions for their use. Call your doctor or health care professional for advice if you get a fever, chills or sore throat, or other symptoms of a cold or flu. Do not treat yourself. This drug decreases your body's ability to fight infections. Try to avoid being around people who are sick. This medicine may increase your risk to bruise or bleed. Call your doctor or health care professional if you notice any unusual bleeding. Be careful brushing and flossing your teeth or using a toothpick because you may get an infection or bleed more easily. If you have any dental work done, tell your dentist you are receiving this medicine. Avoid taking products that contain aspirin, acetaminophen, ibuprofen, naproxen, or ketoprofen unless instructed by your doctor. These medicines may hide a fever. Do not become pregnant while taking this medicine. Women should inform their doctor if they wish to become pregnant or think they might be pregnant. There is a potential for serious side effects to an unborn child. Talk to your health care professional or pharmacist for more information. Do not breast-feed an infant while taking this medicine. What side effects may I notice from receiving this medication? Side effects that you should report to your doctor or health care professional as soon as possible: allergic reactions like skin rash, itching or hives, swelling of the face, lips, or tongue signs of infection - fever or chills, cough, sore throat, pain or difficulty passing urine signs of decreased platelets or bleeding - bruising, pinpoint red spots on the skin, black, tarry stools, nosebleeds signs of decreased red blood cells - unusually weak or tired, fainting spells, lightheadedness breathing problems changes in hearing changes in vision chest pain high blood pressure low blood counts - This drug may decrease the number of white blood cells, red blood cells and  platelets. You may be at increased risk for infections and bleeding. nausea and vomiting pain, swelling, redness or irritation at the injection site pain, tingling, numbness in the hands or feet problems with balance, talking, walking trouble passing urine or change in the amount of urine Side effects that usually do not require medical attention (report to your doctor or health care professional if they continue or are bothersome): hair loss loss of appetite metallic taste in the mouth or changes in taste This list may not describe all possible side effects. Call your doctor for medical advice about side effects. You may report side effects to FDA at 1-800-FDA-1088. Where should I keep my medication? This drug is given in a hospital or clinic and will not be stored at home. NOTE: This sheet is a summary. It may not cover all possible information. If you have questions about this medicine, talk  to your doctor, pharmacist, or health care provider.  2022 Elsevier/Gold Standard (2007-11-13 00:00:00)  Hypokalemia Hypokalemia means that the amount of potassium in the blood is lower than normal. Potassium is a chemical (electrolyte) that helps regulate the amount of fluid in the body. It also stimulates muscle tightening (contraction) and helps nerves work properly. Normally, most of the body's potassium is inside cells, and only a very small amount is in the blood. Because the amount in the blood is so small, minor changes to potassium levels in the blood can be life-threatening. What are the causes? This condition may be caused by: Antibiotic medicine. Diarrhea or vomiting. Taking too much of a medicine that helps you have a bowel movement (laxative) can cause diarrhea and lead to hypokalemia. Chronic kidney disease (CKD). Medicines that help the body get rid of excess fluid (diuretics). Eating disorders, such as bulimia. Low magnesium levels in the body. Sweating a lot. What are the signs  or symptoms? Symptoms of this condition include: Weakness. Constipation. Fatigue. Muscle cramps. Mental confusion. Skipped heartbeats or irregular heartbeat (palpitations). Tingling or numbness. How is this diagnosed? This condition is diagnosed with a blood test. How is this treated? This condition may be treated by: Taking potassium supplements by mouth. Adjusting the medicines that you take. Eating more foods that contain a lot of potassium. If your potassium level is very low, you may need to get potassium through an IV and be monitored in the hospital. Follow these instructions at home:  Take over-the-counter and prescription medicines only as told by your health care provider. This includes vitamins and supplements. Eat a healthy diet. A healthy diet includes fresh fruits and vegetables, whole grains, healthy fats, and lean proteins. If instructed, eat more foods that contain a lot of potassium. This includes: Nuts, such as peanuts and pistachios. Seeds, such as sunflower seeds and pumpkin seeds. Peas, lentils, and lima beans. Whole grain and bran cereals and breads. Fresh fruits and vegetables, such as apricots, avocado, bananas, cantaloupe, kiwi, oranges, tomatoes, asparagus, and potatoes. Orange juice. Tomato juice. Red meats. Yogurt. Keep all follow-up visits as told by your health care provider. This is important. Contact a health care provider if you: Have weakness that gets worse. Feel your heart pounding or racing. Vomit. Have diarrhea. Have diabetes (diabetes mellitus) and you have trouble keeping your blood sugar (glucose) in your target range. Get help right away if you: Have chest pain. Have shortness of breath. Have vomiting or diarrhea that lasts for more than 2 days. Faint. Summary Hypokalemia means that the amount of potassium in the blood is lower than normal. This condition is diagnosed with a blood test. Hypokalemia may be treated by taking  potassium supplements, adjusting the medicines that you take, or eating more foods that are high in potassium. If your potassium level is very low, you may need to get potassium through an IV and be monitored in the hospital. This information is not intended to replace advice given to you by your health care provider. Make sure you discuss any questions you have with your health care provider. Document Revised: 01/15/2018 Document Reviewed: 01/16/2018 Elsevier Patient Education  [employer].

## 2021-06-17 NOTE — Progress Notes (Signed)
Patient presents for treatment. RN assessment completed along with the following:  Labs/vitals reviewed - Yes, and within treatment parameters.   Weight within 10% of previous measurement - Yes Oncology Treatment Attestation completed for current therapy- Yes, on date 03/10/21 Informed consent completed and reflects current therapy/intent - Yes, on date 03/17/21             Provider progress note reviewed - Patient not seen by provider today. Most recent note dated 06/15/21 reviewed. Treatment/Antibody/Supportive plan reviewed - Yes, and there are no adjustments needed for today's treatment. S&H and other orders reviewed - Yes, and there are no additional orders identified. Previous treatment date reviewed - Yes, and the appropriate amount of time has elapsed between treatments. Clinic Hand Off Received from - none   Patient to proceed with treatment.

## 2021-06-17 NOTE — Patient Instructions (Signed)

## 2021-06-20 ENCOUNTER — Encounter: Payer: Self-pay | Admitting: Cardiology

## 2021-06-20 NOTE — Progress Notes (Signed)
Cardiology Office Note:    Date:  06/22/2021   ID:  Allison [name], DOB [birth year], MRN [id number]  PCP:  [name], MD  Cardiologist:  Donato Heinz, MD  Electrophysiologist:  None   Referring MD: [name], MD   Chief Complaint  Patient presents with   Coronary Artery Disease     History of Present Illness:    Allison [name] is a 71 y.o. female with a hx of CAD status post DES to RCA 06/16/19, stage IV lung cancer, hypertension, hyperlipidemia, DVT/PE, CVA who presents for follow-up.  She presented to ED on 06/12/2019 with chest pain, found to have NSTEMI with peak high-sensitivity troponin 9133.  TTE on 06/13/2019 showed LVEF 55 to 60%, mild LVH, low normal RV systolic function, moderate MR. Cath showed 95% proximal RCA lesion, drug-eluting stent was successfully placed.  Zio patch x13 days on 01/07/2020 showed 3 episodes of NSVT longest lasting 7 beats, 17 episodes of SVT, longest lasting 15 seconds.  At clinic visit in September 2021, she reported chest pains.  Lexiscan Myoview on 03/19/2020 showed normal perfusion, EF 70%.  She was admitted to [facility] 02/08/2021 with acute DVT/PE.  Echocardiogram 02/08/2021 showed normal biventricular function, no significant valvular disease.  She was discharged on Eliquis 02/10/2021.  Subsequently was admitted back to [facility] on 02/11/2021 with acute CVA.  This was thought to be embolic in setting of hypercoagulable state due to stage IV lung cancer.  Oncology recommended switching from Eliquis to Lovenox for anticoagulation.  Echocardiogram 02/13/2021 showed normal LV systolic function, mild to moderate MR.  Since last clinic visit, she reports that she has been doing okay.  She continues to have chest pain.  Describes as constant pain.  Improves when she coughs up mucus and also with certain positions.  Has also been feeling short of breath and having lightheadedness.  Reports had syncopal episode while she was eating at a [employer] last month.  States that she stood up and started walk to the door and passed out.  Denies any lower extremity edema.  Has started chemotherapy, on carboplatin and alimta.    Past Medical History:  Diagnosis Date   Anemia    Anxiety    Arthritis    Asthma    exercise induced   Depression    PMH   Dyspnea    GERD (gastroesophageal reflux disease)    Glaucoma    History of radiation therapy 01/05/2021   IMRT right lung  11/24/2020-01/05/2021  Dr [name]   Hypertension    lung ca dx'd 11/2017   right   Malignant pleural effusion    right   PONV (postoperative nausea and vomiting)    Pre-diabetes    Raynaud's disease    Raynaud's disease    Stroke ([place]) 01/2021   balance off, some express aphasia, weakness    Past Surgical History:  Procedure Laterality Date   ABDOMINAL HYSTERECTOMY     partial   BRONCHIAL BIOPSY  04/21/2021   Procedure: BRONCHIAL BIOPSIES;  Surgeon: [name], DO;  Location: [place] [facility];  Service: Pulmonary;;   BRONCHIAL BRUSHINGS  04/21/2021   Procedure: BRONCHIAL BRUSHINGS;  Surgeon: [name], DO;  Location: [place] [facility];  Service: Pulmonary;;   BRONCHIAL NEEDLE ASPIRATION BIOPSY  04/21/2021   Procedure: BRONCHIAL NEEDLE ASPIRATION BIOPSIES;  Surgeon: [name], DO;  Location: [place] [facility];  Service: Pulmonary;;   CHEST TUBE INSERTION Right 01/01/2018   Procedure: INSERTION PLEURAL DRAINAGE CATHETER;  Surgeon: Prescott Gum, Collier Salina, MD;  Location: [facility] OR;  Service: Thoracic;  Laterality: Right;   CHEST TUBE INSERTION  04/21/2021   Procedure: CHEST TUBE INSERTION;  Surgeon: [name], DO;  Location: [place] [facility];  Service: Pulmonary;;   COLONOSCOPY     CORONARY STENT INTERVENTION N/A 06/16/2019   Procedure: CORONARY STENT INTERVENTION;  Surgeon: Jettie Booze, MD;  Location: [place] [facility];  Service: Cardiovascular;  Laterality: N/A;   DILATION AND CURETTAGE OF UTERUS     EYE SURGERY     due to Glaucoma   IR  IMAGING GUIDED PORT INSERTION  03/22/2021   IR PORT REPAIR CENTRAL VENOUS ACCESS DEVICE  04/15/2021   LEFT HEART CATH AND CORONARY ANGIOGRAPHY N/A 06/16/2019   Procedure: LEFT HEART CATH AND CORONARY ANGIOGRAPHY;  Surgeon: Jettie Booze, MD;  Location: [place] [facility];  Service: Cardiovascular;  Laterality: N/A;   REMOVAL OF PLEURAL DRAINAGE CATHETER Right 11/07/2018   Procedure: REMOVAL OF PLEURAL DRAINAGE CATHETER;  Surgeon: Ivin Poot, MD;  Location: [place];  Service: Thoracic;  Laterality: Right;   ROTATOR CUFF REPAIR     TUBAL LIGATION     VIDEO BRONCHOSCOPY WITH ENDOBRONCHIAL NAVIGATION Bilateral 04/21/2021   Procedure: VIDEO BRONCHOSCOPY WITH ENDOBRONCHIAL NAVIGATION;  Surgeon: [name], DO;  Location: [place];  Service: Pulmonary;  Laterality: Bilateral;  ION   WISDOM TOOTH EXTRACTION      Current Medications: No outpatient medications have been marked as taking for the 06/21/21 encounter (Office Visit) with Donato Heinz, MD.     Allergies:   Penicillins, Vicodin [hydrocodone-acetaminophen], and Other   Social History   Socioeconomic History   Marital status: Widowed    Spouse name: Not on file   Number of children: 3   Years of education: Not on file   Highest education level: Not on file  Occupational History    Employer: [employer]  Tobacco Use   Smoking status: Never   Smokeless tobacco: Never  Vaping Use   Vaping Use: Never used  Substance and Sexual Activity   Alcohol use: Not Currently    Comment: up to 3 drinks per week   Drug use: No    Comment: CBD oil    Sexual activity: Not Currently    Comment: Hysterectomy  Other Topics Concern   Not on file  Social History Narrative   Not on file   Social Determinants of Health   Financial Resource Strain: Not on file  Food Insecurity: Not on file  Transportation Needs: Not on file  Physical Activity: Not on file  Stress: Not on file  Social Connections: Not on file      Family History: The patient's family history includes Heart disease in her brother and sister; Lung cancer in an other family member.  ROS:   Please see the history of present illness.     All other systems reviewed and are negative.  EKGs/Labs/Other Studies Reviewed:    The following studies were reviewed today:   EKG:   06/21/21: Normal sinus rhythm, rate 70, no ST abnormality   Cath 06/16/19: Prox LAD lesion is 25% stenosed. Prox RCA lesion is 95% stenosed. A drug-eluting stent was successfully placed using a SYNERGY XD 2.25X28, postdilated to 2.5 mm. Post intervention, there is a 0% residual stenosis. The left ventricular systolic function is normal. LV end diastolic pressure is normal. The left ventricular ejection fraction is 55-65% by visual estimate. There is no aortic  valve stenosis.   Continue dual antiplatelet therapy.  Would check with Pharmacy about interactions of Plavix with Tagrisso.  If there is an interaction, would switch to Brilinta.        TTE 06/13/19:  1. Left ventricular ejection fraction, by visual estimation, is 55 to  60%. The left ventricle has normal function. There is mildly increased  left ventricular hypertrophy.   2. The left ventricle has no regional wall motion abnormalities.   3. Global right ventricle has low normal systolic function.The right  ventricular size is normal. No increase in right ventricular wall  thickness.   4. Left atrial size was normal.   5. Right atrial size was normal.   6. Mild mitral annular calcification.   7. The mitral valve is degenerative. Moderate mitral valve regurgitation.   8. The tricuspid valve is grossly normal.   9. The aortic valve is tricuspid. Aortic valve regurgitation is not  visualized. No evidence of aortic valve sclerosis or stenosis.  10. The pulmonic valve was grossly normal. Pulmonic valve regurgitation is  not visualized.  11. Normal pulmonary artery systolic pressure.  12. The  inferior vena cava is normal in size with greater than 50%  respiratory variability, suggesting right atrial pressure of 3 mmHg.   Carotid duplex 08/01/19: Right Carotid: There is no evidence of stenosis in the right ICA. The                 extracranial vessels were near-normal with only minimal  wall                 thickening or plaque.   Left Carotid: Velocities in the left ICA are consistent with a 1-39%  stenosis.                Non-hemodynamically significant plaque <50% noted in the  CCA.   Vertebrals:  Bilateral vertebral arteries demonstrate antegrade flow.  Subclavians: Normal flow hemodynamics were seen in bilateral subclavian               arteries.   Recent Labs: 02/07/2021: B Natriuretic Peptide 35.4 02/08/2021: TSH 0.382 02/16/2021: Magnesium 2.1 06/17/2021: ALT 25; BUN 23; Creatinine 1.04; Hemoglobin 9.5; Platelet Count 214; Potassium 3.0; Sodium 136  Recent Lipid Panel    Component Value Date/Time   CHOL 175 02/12/2021 0559   CHOL 153 11/01/2020 0839   TRIG 110 02/12/2021 0559   HDL 62 02/12/2021 0559   HDL 64 11/01/2020 0839   CHOLHDL 2.8 02/12/2021 0559   VLDL 22 02/12/2021 0559   LDLCALC 91 02/12/2021 0559   LDLCALC 74 11/01/2020 0839    Physical Exam:    VS:  BP 130/62    Pulse 60    Ht 5\' 4"  (1.626 m)    Wt 116 lb 3.2 oz (52.7 kg)    LMP  (LMP Unknown)    SpO2 100%    BMI 19.95 kg/m     Wt Readings from Last 3 Encounters:  06/21/21 116 lb 3.2 oz (52.7 kg)  06/17/21 117 lb 12.8 oz (53.4 kg)  06/02/21 117 lb 2 oz (53.1 kg)     GEN:  Well nourished, well developed in no acute distress HEENT: Normal NECK: No JVD CARDIAC: RRR, no murmurs, rubs, gallops RESPIRATORY:  Clear to auscultation without rales, wheezing or rhonchi  ABDOMEN: Soft, non-tender, non-distended MUSCULOSKELETAL:  No edema SKIN: Warm and dry NEUROLOGIC:  Alert and oriented x 3 PSYCHIATRIC:  Normal affect   ASSESSMENT:  1. Syncope and collapse   2. Chest pain of uncertain  etiology   3. Essential hypertension   4. Hyperlipidemia, unspecified hyperlipidemia type      PLAN:    CAD: Status post drug-eluting stent to RCA on 06/16/2019.  At clinic visit in September 2021, she reported chest pains.  Lexiscan Myoview on 03/19/2020 showed normal perfusion, EF 70%.  She reported exertional chest pain and repeat Myoview 03/29/2021 showed normal perfusion, EF 68%. -Continue aspirin 81 mg daily.  Plavix was discontinued in November 2021 due to rectal bleeding  -Continue Coreg 12.5 mg twice daily -Has had issues starting statins due to transaminitis and myalgias.  Referred to lipid clinic and started on Praluent in September 2021.   Acute DVT/PE: admitted to [facility] 02/08/2021 with acute DVT/PE.  She was started on Eliquis, but switched to therapeutic Lovenox after her CVA 02/10/2021 per oncology  Acute CVA: admitted back to [facility] on 02/11/2021 with acute CVA.  This was thought to be embolic in setting of hypercoagulable state due to stage IV lung cancer.  Oncology recommended switching from Eliquis to Lovenox for anticoagulation.  Palpitations: Description concerning for arrhythmia.  Zio patch x13 days on 01/07/2020 showed 3 episodes of NSVT longest lasting 7 beats, 17 episodes of SVT, longest lasting 15 seconds.  Continue carvedilol.  Carotid bruit: 1 to 39% left carotid artery stenosis, no stenosis in right coronary artery on carotid duplex 08/01/2019.  Hyperlipidemia: LDL 148 on 06/13/2019, improved to LDL 44 08/15/19 while on Lipitor 80 mg daily, but was discontinued due to transaminitis.  She also reported having myalgias with atorvastatin.  Referred to lipid clinic, started on rosuvastatin 5 mg every other day but developed myalgias.  Was reduced to twice weekly but continued to have myalgias so was discontinued.  Started on Praluent September 2021.  LDL 80 on 06/15/2020, Praluent dose increased to 150 mg every 14 days.  LDL 91 on 02/12/21  Hypertension: On carvedilol 12.5 mg twice  daily, amlodipine 5 mg daily, Imdur 30 mg daily and and chlorthalidone 25 mg daily.  Appears controlled  Stage IV lung cancer: Followed by oncology.  Has been on Tagrisso.  CT showed progression of disease, on chemotherapy now with carboplatin and alimta.   Syncope: Description suggest orthostasis.  Will check Zio patch x2 weeks to evaluate for arrhythmia  RTC in 6 months   Medication Adjustments/Labs and Tests Ordered: Current medicines are reviewed at length with the patient today.  Concerns regarding medicines are outlined above.  Orders Placed This Encounter  Procedures   LONG TERM MONITOR (3-14 DAYS)   EKG 12-Lead    No orders of the defined types were placed in this encounter.    Patient Instructions  Testing/Procedures:  Bryn Gulling- Long Term Monitor Instructions  Your physician has requested you wear a ZIO patch monitor for 14 days.  This is a single patch monitor. [employer] supplies one patch monitor per enrollment. Additional stickers are not available. Please do not apply patch if you will be having a Nuclear Stress Test,  Echocardiogram, Cardiac CT, MRI, or Chest Xray during the period you would be wearing the  monitor. The patch cannot be worn during these tests. You cannot remove and re-apply the  ZIO XT patch monitor.  Your ZIO patch monitor will be mailed 3 day USPS to your address on file. It may take 3-5 days  to receive your monitor after you have been enrolled.  Once you have received your monitor, please review the  enclosed instructions. Your monitor  has already been registered assigning a specific monitor serial # to you.  Billing and Patient Assistance Program Information  We have supplied [employer] with any of your insurance information on file for billing purposes. [employer] offers a sliding scale Patient Assistance Program for patients that do not have  insurance, or whose insurance does not completely cover the cost of the ZIO monitor.  You must apply for  the Patient Assistance Program to qualify for this discounted rate.  To apply, please call [employer] at [phone], select option 4, select option 2, ask to apply for  Patient Assistance Program. Theodore Demark will ask your household income, and how many people  are in your household. They will quote your out-of-pocket cost based on that information.  [employer] will also be able to set up a 67-month, interest-free payment plan if needed.  Applying the monitor   Shave hair from upper left chest.  Hold abrader disc by orange tab. Rub abrader in 40 strokes over the upper left chest as  indicated in your monitor instructions.  Clean area with 4 enclosed alcohol pads. Let dry.  Apply patch as indicated in monitor instructions. Patch will be placed under collarbone on left  side of chest with arrow pointing upward.  Rub patch adhesive wings for 2 minutes. Remove white label marked "1". Remove the white  label marked "2". Rub patch adhesive wings for 2 additional minutes.  While looking in a mirror, press and release button in center of patch. A small green light will  flash 3-4 times. This will be your only indicator that the monitor has been turned on.  Do not shower for the first 24 hours. You may shower after the first 24 hours.  Press the button if you feel a symptom. You will hear a small click. Record Date, Time and  Symptom in the Patient Logbook.  When you are ready to remove the patch, follow instructions on the last 2 pages of Patient  Logbook. Stick patch monitor onto the last page of Patient Logbook.  Place Patient Logbook in the blue and white box. Use locking tab on box and tape box closed  securely. The blue and white box has prepaid postage on it. Please place it in the mailbox as  soon as possible. Your physician should have your test results approximately 7 days after the  monitor has been mailed back to [facility].  Call [place] at [phone] if you have  questions regarding  your ZIO XT patch monitor. Call them immediately if you see an orange light blinking on your  monitor.  If your monitor falls off in less than 4 days, contact our Monitor department at [phone].  If your monitor becomes loose or falls off after 4 days call [employer] at [phone] for  suggestions on securing your monitor    Follow-Up: At [facility], you and your health needs are our priority.  As part of our continuing mission to provide you with exceptional heart care, we have created designated Provider Care Teams.  These Care Teams include your primary Cardiologist (physician) and Advanced Practice Providers (APPs -  Physician Assistants and Nurse Practitioners) who all work together to provide you with the care you need, when you need it.  We recommend signing up for the patient portal called "MyChart".  Sign up information is provided on this After Visit Summary.  MyChart is used to connect with patients for Virtual Visits (Telemedicine).  Patients are  able to view lab/test results, encounter notes, upcoming appointments, etc.  Non-urgent messages can be sent to your provider as well.   To learn more about what you can do with MyChart, go to [email or web address].    Your next appointment:   6 month(s)  The format for your next appointment:   In Person  Provider:   Donato Heinz, MD       Signed, Donato Heinz, MD  06/22/2021 12:10 AM    [place]

## 2021-06-21 ENCOUNTER — Encounter: Payer: Self-pay | Admitting: Cardiology

## 2021-06-21 ENCOUNTER — Ambulatory Visit (INDEPENDENT_AMBULATORY_CARE_PROVIDER_SITE_OTHER): Payer: Medicare HMO

## 2021-06-21 ENCOUNTER — Other Ambulatory Visit: Payer: Self-pay | Admitting: Physician Assistant

## 2021-06-21 ENCOUNTER — Ambulatory Visit: Payer: Medicare HMO | Admitting: Cardiology

## 2021-06-21 ENCOUNTER — Other Ambulatory Visit: Payer: Self-pay

## 2021-06-21 VITALS — BP 130/62 | HR 60 | Ht 64.0 in | Wt 116.2 lb

## 2021-06-21 DIAGNOSIS — R55 Syncope and collapse: Secondary | ICD-10-CM

## 2021-06-21 DIAGNOSIS — I10 Essential (primary) hypertension: Secondary | ICD-10-CM | POA: Diagnosis not present

## 2021-06-21 DIAGNOSIS — E785 Hyperlipidemia, unspecified: Secondary | ICD-10-CM

## 2021-06-21 DIAGNOSIS — R079 Chest pain, unspecified: Secondary | ICD-10-CM | POA: Diagnosis not present

## 2021-06-21 DIAGNOSIS — E876 Hypokalemia: Secondary | ICD-10-CM

## 2021-06-21 MED ORDER — POTASSIUM CHLORIDE CRYS ER 20 MEQ PO TBCR: 20.0000 meq | EXTENDED_RELEASE_TABLET | Freq: Every day | ORAL | 0 refills | Status: DC

## 2021-06-21 NOTE — Progress Notes (Unsigned)
Enrolled for [employer] to mail a ZIO XT long term holter monitor to the patients address on file.  

## 2021-06-21 NOTE — Patient Instructions (Signed)
Testing/Procedures:  Bryn Gulling- Long Term Monitor Instructions  Your physician has requested you wear a ZIO patch monitor for 14 days.  This is a single patch monitor. [employer] supplies one patch monitor per enrollment. Additional stickers are not available. Please do not apply patch if you will be having a Nuclear Stress Test,  Echocardiogram, Cardiac CT, MRI, or Chest Xray during the period you would be wearing the  monitor. The patch cannot be worn during these tests. You cannot remove and re-apply the  ZIO XT patch monitor.  Your ZIO patch monitor will be mailed 3 day USPS to your address on file. It may take 3-5 days  to receive your monitor after you have been enrolled.  Once you have received your monitor, please review the enclosed instructions. Your monitor  has already been registered assigning a specific monitor serial # to you.  Billing and Patient Assistance Program Information  We have supplied [employer] with any of your insurance information on file for billing purposes. [employer] offers a sliding scale Patient Assistance Program for patients that do not have  insurance, or whose insurance does not completely cover the cost of the ZIO monitor.  You must apply for the Patient Assistance Program to qualify for this discounted rate.  To apply, please call [employer] at [phone], select option 4, select option 2, ask to apply for  Patient Assistance Program. Theodore Demark will ask your household income, and how many people  are in your household. They will quote your out-of-pocket cost based on that information.  [employer] will also be able to set up a 2-month, interest-free payment plan if needed.  Applying the monitor   Shave hair from upper left chest.  Hold abrader disc by orange tab. Rub abrader in 40 strokes over the upper left chest as  indicated in your monitor instructions.  Clean area with 4 enclosed alcohol pads. Let dry.  Apply patch as indicated in monitor instructions.  Patch will be placed under collarbone on left  side of chest with arrow pointing upward.  Rub patch adhesive wings for 2 minutes. Remove white label marked "1". Remove the white  label marked "2". Rub patch adhesive wings for 2 additional minutes.  While looking in a mirror, press and release button in center of patch. A small green light will  flash 3-4 times. This will be your only indicator that the monitor has been turned on.  Do not shower for the first 24 hours. You may shower after the first 24 hours.  Press the button if you feel a symptom. You will hear a small click. Record Date, Time and  Symptom in the Patient Logbook.  When you are ready to remove the patch, follow instructions on the last 2 pages of Patient  Logbook. Stick patch monitor onto the last page of Patient Logbook.  Place Patient Logbook in the blue and white box. Use locking tab on box and tape box closed  securely. The blue and white box has prepaid postage on it. Please place it in the mailbox as  soon as possible. Your physician should have your test results approximately 7 days after the  monitor has been mailed back to [facility].  Call [place] at [phone] if you have questions regarding  your ZIO XT patch monitor. Call them immediately if you see an orange light blinking on your  monitor.  If your monitor falls off in less than 4 days, contact our Monitor department at [phone].  If your monitor becomes loose or falls off after 4 days call [employer] at [phone] for  suggestions on securing your monitor    Follow-Up: At [facility], you and your health needs are our priority.  As part of our continuing mission to provide you with exceptional heart care, we have created designated Provider Care Teams.  These Care Teams include your primary Cardiologist (physician) and Advanced Practice Providers (APPs -  Physician Assistants and Nurse Practitioners) who all work together to  provide you with the care you need, when you need it.  We recommend signing up for the patient portal called "MyChart".  Sign up information is provided on this After Visit Summary.  MyChart is used to connect with patients for Virtual Visits (Telemedicine).  Patients are able to view lab/test results, encounter notes, upcoming appointments, etc.  Non-urgent messages can be sent to your provider as well.   To learn more about what you can do with MyChart, go to [email or web address].    Your next appointment:   6 month(s)  The format for your next appointment:   In Person  Provider:   Donato Heinz, MD

## 2021-06-22 ENCOUNTER — Telehealth: Payer: Self-pay

## 2021-06-22 DIAGNOSIS — R630 Anorexia: Secondary | ICD-10-CM | POA: Diagnosis not present

## 2021-06-22 DIAGNOSIS — C3491 Malignant neoplasm of unspecified part of right bronchus or lung: Secondary | ICD-10-CM | POA: Diagnosis not present

## 2021-06-22 DIAGNOSIS — R0789 Other chest pain: Secondary | ICD-10-CM | POA: Diagnosis not present

## 2021-06-22 DIAGNOSIS — E639 Nutritional deficiency, unspecified: Secondary | ICD-10-CM | POA: Diagnosis not present

## 2021-06-22 DIAGNOSIS — R0989 Other specified symptoms and signs involving the circulatory and respiratory systems: Secondary | ICD-10-CM | POA: Diagnosis not present

## 2021-06-22 DIAGNOSIS — R0600 Dyspnea, unspecified: Secondary | ICD-10-CM | POA: Diagnosis not present

## 2021-06-22 NOTE — Telephone Encounter (Signed)
-----   Message from [place], PA-C sent at 06/21/2021  7:48 AM EST ----- Please let her know I have sent potassium to the pharmacy ----- Message ----- From: [place]: 06/17/2021   9:56 AM EST To: Curt Bears, MD

## 2021-06-23 ENCOUNTER — Other Ambulatory Visit: Payer: Self-pay

## 2021-06-23 ENCOUNTER — Inpatient Hospital Stay: Payer: Medicare HMO | Attending: Internal Medicine

## 2021-06-23 DIAGNOSIS — C771 Secondary and unspecified malignant neoplasm of intrathoracic lymph nodes: Secondary | ICD-10-CM | POA: Diagnosis not present

## 2021-06-23 DIAGNOSIS — Z5111 Encounter for antineoplastic chemotherapy: Secondary | ICD-10-CM | POA: Insufficient documentation

## 2021-06-23 DIAGNOSIS — F419 Anxiety disorder, unspecified: Secondary | ICD-10-CM | POA: Insufficient documentation

## 2021-06-23 DIAGNOSIS — H269 Unspecified cataract: Secondary | ICD-10-CM | POA: Insufficient documentation

## 2021-06-23 DIAGNOSIS — Z515 Encounter for palliative care: Secondary | ICD-10-CM | POA: Diagnosis not present

## 2021-06-23 DIAGNOSIS — Z8673 Personal history of transient ischemic attack (TIA), and cerebral infarction without residual deficits: Secondary | ICD-10-CM | POA: Diagnosis not present

## 2021-06-23 DIAGNOSIS — C3411 Malignant neoplasm of upper lobe, right bronchus or lung: Secondary | ICD-10-CM | POA: Diagnosis present

## 2021-06-23 DIAGNOSIS — R5383 Other fatigue: Secondary | ICD-10-CM | POA: Insufficient documentation

## 2021-06-23 DIAGNOSIS — Z7982 Long term (current) use of aspirin: Secondary | ICD-10-CM | POA: Insufficient documentation

## 2021-06-23 DIAGNOSIS — C3491 Malignant neoplasm of unspecified part of right bronchus or lung: Secondary | ICD-10-CM

## 2021-06-23 DIAGNOSIS — H409 Unspecified glaucoma: Secondary | ICD-10-CM | POA: Diagnosis not present

## 2021-06-23 DIAGNOSIS — R630 Anorexia: Secondary | ICD-10-CM | POA: Insufficient documentation

## 2021-06-23 DIAGNOSIS — E876 Hypokalemia: Secondary | ICD-10-CM | POA: Diagnosis not present

## 2021-06-23 DIAGNOSIS — Z7901 Long term (current) use of anticoagulants: Secondary | ICD-10-CM | POA: Insufficient documentation

## 2021-06-23 DIAGNOSIS — R110 Nausea: Secondary | ICD-10-CM | POA: Diagnosis not present

## 2021-06-23 DIAGNOSIS — D509 Iron deficiency anemia, unspecified: Secondary | ICD-10-CM | POA: Diagnosis not present

## 2021-06-23 DIAGNOSIS — I2510 Atherosclerotic heart disease of native coronary artery without angina pectoris: Secondary | ICD-10-CM | POA: Diagnosis not present

## 2021-06-23 DIAGNOSIS — G893 Neoplasm related pain (acute) (chronic): Secondary | ICD-10-CM | POA: Diagnosis not present

## 2021-06-23 DIAGNOSIS — E785 Hyperlipidemia, unspecified: Secondary | ICD-10-CM | POA: Diagnosis not present

## 2021-06-23 DIAGNOSIS — F32A Depression, unspecified: Secondary | ICD-10-CM | POA: Insufficient documentation

## 2021-06-23 DIAGNOSIS — E559 Vitamin D deficiency, unspecified: Secondary | ICD-10-CM | POA: Diagnosis not present

## 2021-06-23 DIAGNOSIS — Z79899 Other long term (current) drug therapy: Secondary | ICD-10-CM | POA: Diagnosis not present

## 2021-06-23 DIAGNOSIS — K219 Gastro-esophageal reflux disease without esophagitis: Secondary | ICD-10-CM | POA: Diagnosis not present

## 2021-06-23 DIAGNOSIS — J910 Malignant pleural effusion: Secondary | ICD-10-CM | POA: Insufficient documentation

## 2021-06-23 DIAGNOSIS — Z86711 Personal history of pulmonary embolism: Secondary | ICD-10-CM | POA: Diagnosis not present

## 2021-06-23 DIAGNOSIS — R69 Illness, unspecified: Secondary | ICD-10-CM | POA: Diagnosis not present

## 2021-06-23 DIAGNOSIS — G4700 Insomnia, unspecified: Secondary | ICD-10-CM | POA: Diagnosis not present

## 2021-06-23 DIAGNOSIS — I10 Essential (primary) hypertension: Secondary | ICD-10-CM | POA: Diagnosis not present

## 2021-06-23 DIAGNOSIS — N959 Unspecified menopausal and perimenopausal disorder: Secondary | ICD-10-CM | POA: Diagnosis not present

## 2021-06-23 DIAGNOSIS — Z79891 Long term (current) use of opiate analgesic: Secondary | ICD-10-CM | POA: Insufficient documentation

## 2021-06-23 DIAGNOSIS — Z66 Do not resuscitate: Secondary | ICD-10-CM | POA: Insufficient documentation

## 2021-06-23 DIAGNOSIS — I7300 Raynaud's syndrome without gangrene: Secondary | ICD-10-CM | POA: Diagnosis not present

## 2021-06-23 DIAGNOSIS — Z95828 Presence of other vascular implants and grafts: Secondary | ICD-10-CM

## 2021-06-23 DIAGNOSIS — Z86718 Personal history of other venous thrombosis and embolism: Secondary | ICD-10-CM | POA: Diagnosis not present

## 2021-06-23 LAB — CMP (CANCER CENTER ONLY)
ALT: 32 U/L (ref 0–44)
AST: 27 U/L (ref 15–41)
Albumin: 3.8 g/dL (ref 3.5–5.0)
Alkaline Phosphatase: 60 U/L (ref 38–126)
Anion gap: 7 (ref 5–15)
BUN: 22 mg/dL (ref 8–23)
CO2: 30 mmol/L (ref 22–32)
Calcium: 9.2 mg/dL (ref 8.9–10.3)
Chloride: 98 mmol/L (ref 98–111)
Creatinine: 1.06 mg/dL — ABNORMAL HIGH (ref 0.44–1.00)
GFR, Estimated: 57 mL/min — ABNORMAL LOW (ref >60)
Glucose, Bld: 95 mg/dL (ref 70–99)
Potassium: 3.6 mmol/L (ref 3.5–5.1)
Sodium: 135 mmol/L (ref 135–145)
Total Bilirubin: 0.5 mg/dL (ref 0.3–1.2)
Total Protein: 7 g/dL (ref 6.5–8.1)

## 2021-06-23 LAB — CBC WITH DIFFERENTIAL (CANCER CENTER ONLY)
Abs Immature Granulocytes: 0 10*3/uL (ref 0.00–0.07)
Basophils Absolute: 0 10*3/uL (ref 0.0–0.1)
Basophils Relative: 1 %
Eosinophils Absolute: 0 10*3/uL (ref 0.0–0.5)
Eosinophils Relative: 1 %
HCT: 30 % — ABNORMAL LOW (ref 36.0–46.0)
Hemoglobin: 9.5 g/dL — ABNORMAL LOW (ref 12.0–15.0)
Immature Granulocytes: 0 %
Lymphocytes Relative: 25 %
Lymphs Abs: 0.4 10*3/uL — ABNORMAL LOW (ref 0.7–4.0)
MCH: 27.9 pg (ref 26.0–34.0)
MCHC: 31.7 g/dL (ref 30.0–36.0)
MCV: 88.2 fL (ref 80.0–100.0)
Monocytes Absolute: 0.2 10*3/uL (ref 0.1–1.0)
Monocytes Relative: 11 %
Neutro Abs: 1 10*3/uL — ABNORMAL LOW (ref 1.7–7.7)
Neutrophils Relative %: 62 %
Platelet Count: 164 10*3/uL (ref 150–400)
RBC: 3.4 MIL/uL — ABNORMAL LOW (ref 3.87–5.11)
RDW: 18.5 % — ABNORMAL HIGH (ref 11.5–15.5)
WBC Count: 1.5 10*3/uL — ABNORMAL LOW (ref 4.0–10.5)
nRBC: 0 % (ref 0.0–0.2)

## 2021-06-23 MED ORDER — SODIUM CHLORIDE 0.9% FLUSH
10.0000 mL | INTRAVENOUS | Status: AC | PRN
  Administered 2021-06-23: 10 mL

## 2021-06-24 DIAGNOSIS — R55 Syncope and collapse: Secondary | ICD-10-CM

## 2021-06-27 NOTE — Telephone Encounter (Incomplete Revision)
I spoke with pt and advised of this.

## 2021-06-27 NOTE — Telephone Encounter (Signed)
I poke wi

## 2021-06-29 ENCOUNTER — Telehealth: Payer: Self-pay

## 2021-06-29 NOTE — Telephone Encounter (Signed)
Oral Oncology Patient Advocate Encounter   Received notification from [place] that prior authorization for Tagrisso is required.   PA submitted on CoverMyMeds Key [id number] Status is pending   Oral Oncology Clinic will continue to follow.  [place] Patient [place] Phone [phone] Fax [phone] 06/29/2021 9:16 AM

## 2021-06-30 ENCOUNTER — Other Ambulatory Visit: Payer: Self-pay

## 2021-06-30 ENCOUNTER — Telehealth: Payer: Self-pay | Admitting: Physician Assistant

## 2021-06-30 ENCOUNTER — Other Ambulatory Visit: Payer: Self-pay | Admitting: Physician Assistant

## 2021-06-30 ENCOUNTER — Other Ambulatory Visit: Payer: Medicare HMO

## 2021-06-30 ENCOUNTER — Ambulatory Visit (HOSPITAL_BASED_OUTPATIENT_CLINIC_OR_DEPARTMENT_OTHER)
Admission: RE | Admit: 2021-06-30 | Discharge: 2021-06-30 | Disposition: A | Discharge status: Home / Self Care | Payer: Medicare HMO | Source: Ambulatory Visit | Attending: Physician Assistant | Admitting: Physician Assistant

## 2021-06-30 ENCOUNTER — Encounter (HOSPITAL_BASED_OUTPATIENT_CLINIC_OR_DEPARTMENT_OTHER): Payer: Self-pay

## 2021-06-30 ENCOUNTER — Inpatient Hospital Stay: Payer: Medicare HMO

## 2021-06-30 DIAGNOSIS — D539 Nutritional anemia, unspecified: Secondary | ICD-10-CM

## 2021-06-30 DIAGNOSIS — C3491 Malignant neoplasm of unspecified part of right bronchus or lung: Secondary | ICD-10-CM | POA: Diagnosis not present

## 2021-06-30 DIAGNOSIS — E876 Hypokalemia: Secondary | ICD-10-CM

## 2021-06-30 DIAGNOSIS — I700 Atherosclerosis of aorta: Secondary | ICD-10-CM | POA: Diagnosis not present

## 2021-06-30 DIAGNOSIS — Z95828 Presence of other vascular implants and grafts: Secondary | ICD-10-CM

## 2021-06-30 DIAGNOSIS — J439 Emphysema, unspecified: Secondary | ICD-10-CM | POA: Diagnosis not present

## 2021-06-30 DIAGNOSIS — C3490 Malignant neoplasm of unspecified part of unspecified bronchus or lung: Secondary | ICD-10-CM | POA: Diagnosis not present

## 2021-06-30 LAB — CBC WITH DIFFERENTIAL (CANCER CENTER ONLY)
Abs Immature Granulocytes: 0 10*3/uL (ref 0.00–0.07)
Basophils Absolute: 0 10*3/uL (ref 0.0–0.1)
Basophils Relative: 0 %
Eosinophils Absolute: 0 10*3/uL (ref 0.0–0.5)
Eosinophils Relative: 1 %
HCT: 25 % — ABNORMAL LOW (ref 36.0–46.0)
Hemoglobin: 7.8 g/dL — ABNORMAL LOW (ref 12.0–15.0)
Immature Granulocytes: 0 %
Lymphocytes Relative: 18 %
Lymphs Abs: 0.4 10*3/uL — ABNORMAL LOW (ref 0.7–4.0)
MCH: 28 pg (ref 26.0–34.0)
MCHC: 31.2 g/dL (ref 30.0–36.0)
MCV: 89.6 fL (ref 80.0–100.0)
Monocytes Absolute: 0.4 10*3/uL (ref 0.1–1.0)
Monocytes Relative: 18 %
Neutro Abs: 1.4 10*3/uL — ABNORMAL LOW (ref 1.7–7.7)
Neutrophils Relative %: 63 %
Platelet Count: 104 10*3/uL — ABNORMAL LOW (ref 150–400)
RBC: 2.79 MIL/uL — ABNORMAL LOW (ref 3.87–5.11)
RDW: 18.6 % — ABNORMAL HIGH (ref 11.5–15.5)
WBC Count: 2.2 10*3/uL — ABNORMAL LOW (ref 4.0–10.5)
nRBC: 0 % (ref 0.0–0.2)

## 2021-06-30 LAB — CMP (CANCER CENTER ONLY)
ALT: 20 U/L (ref 0–44)
AST: 23 U/L (ref 15–41)
Albumin: 3.6 g/dL (ref 3.5–5.0)
Alkaline Phosphatase: 58 U/L (ref 38–126)
Anion gap: 8 (ref 5–15)
BUN: 13 mg/dL (ref 8–23)
CO2: 28 mmol/L (ref 22–32)
Calcium: 8.7 mg/dL — ABNORMAL LOW (ref 8.9–10.3)
Chloride: 102 mmol/L (ref 98–111)
Creatinine: 1.02 mg/dL — ABNORMAL HIGH (ref 0.44–1.00)
GFR, Estimated: 59 mL/min — ABNORMAL LOW (ref >60)
Glucose, Bld: 99 mg/dL (ref 70–99)
Potassium: 3 mmol/L — ABNORMAL LOW (ref 3.5–5.1)
Sodium: 138 mmol/L (ref 135–145)
Total Bilirubin: 0.2 mg/dL — ABNORMAL LOW (ref 0.3–1.2)
Total Protein: 6.3 g/dL — ABNORMAL LOW (ref 6.5–8.1)

## 2021-06-30 LAB — ABO/RH: ABO/RH(D): O POS

## 2021-06-30 LAB — PREPARE RBC (CROSSMATCH)

## 2021-06-30 LAB — SAMPLE TO BLOOD BANK

## 2021-06-30 MED ORDER — POTASSIUM CHLORIDE CRYS ER 20 MEQ PO TBCR: 20.0000 meq | EXTENDED_RELEASE_TABLET | Freq: Every day | ORAL | 0 refills | Status: DC

## 2021-06-30 MED ORDER — IOHEXOL 300 MG/ML  SOLN
60.0000 mL | Freq: Once | INTRAMUSCULAR | Status: AC | PRN
  Administered 2021-06-30: 60 mL via INTRAVENOUS

## 2021-06-30 MED ORDER — SODIUM CHLORIDE 0.9% FLUSH
10.0000 mL | Freq: Once | INTRAVENOUS | Status: AC
  Administered 2021-06-30: 10 mL via INTRAVENOUS

## 2021-06-30 MED ORDER — HEPARIN SOD (PORK) LOCK FLUSH 100 UNIT/ML IV SOLN
500.0000 [IU] | Freq: Once | INTRAVENOUS | Status: AC
  Administered 2021-06-30: 500 [IU] via INTRAVENOUS

## 2021-06-30 NOTE — Telephone Encounter (Signed)
I called the patient to discuss her labs from today.  Her hemoglobin is low at 7.8.  I reviewed with Dr. Julien Nordmann.  Dr. Julien Nordmann recommends arranging for 2 units of blood.  I called the patient to notify her of this but was unable to reach her.  I left a voicemail encouraging her to call us back so we can discuss this further.  We will reach out to [facility] to see if they are able to accommodate 2 units of blood hopefully tomorrow.

## 2021-06-30 NOTE — Progress Notes (Signed)
I called the patient to let her know that I sent another prescription for potassium to her pharmacy. We will check magnesium on her next lab draw due to her frequent hypokalemia.

## 2021-06-30 NOTE — Addendum Note (Signed)
Addended by: Teodoro Spray on: 06/30/2021 11:55 AM   Modules accepted: Orders

## 2021-06-30 NOTE — Progress Notes (Signed)
Patient was informed that her blood transfusion will be done at  [facility] on Saturday and she was agreeable with this. She was also informed not to take her Blue Blood Bracelet off until after her blood transfusion is completed and she verbalized understanding.

## 2021-06-30 NOTE — Patient Instructions (Signed)

## 2021-07-01 DIAGNOSIS — K219 Gastro-esophageal reflux disease without esophagitis: Secondary | ICD-10-CM | POA: Diagnosis not present

## 2021-07-01 DIAGNOSIS — N959 Unspecified menopausal and perimenopausal disorder: Secondary | ICD-10-CM | POA: Diagnosis not present

## 2021-07-01 DIAGNOSIS — I10 Essential (primary) hypertension: Secondary | ICD-10-CM | POA: Diagnosis not present

## 2021-07-01 DIAGNOSIS — H409 Unspecified glaucoma: Secondary | ICD-10-CM | POA: Diagnosis not present

## 2021-07-01 DIAGNOSIS — I7300 Raynaud's syndrome without gangrene: Secondary | ICD-10-CM | POA: Diagnosis not present

## 2021-07-01 DIAGNOSIS — E559 Vitamin D deficiency, unspecified: Secondary | ICD-10-CM | POA: Diagnosis not present

## 2021-07-01 DIAGNOSIS — R69 Illness, unspecified: Secondary | ICD-10-CM | POA: Diagnosis not present

## 2021-07-01 DIAGNOSIS — C3491 Malignant neoplasm of unspecified part of right bronchus or lung: Secondary | ICD-10-CM | POA: Diagnosis not present

## 2021-07-01 DIAGNOSIS — G4700 Insomnia, unspecified: Secondary | ICD-10-CM | POA: Diagnosis not present

## 2021-07-01 DIAGNOSIS — I2510 Atherosclerotic heart disease of native coronary artery without angina pectoris: Secondary | ICD-10-CM | POA: Diagnosis not present

## 2021-07-02 ENCOUNTER — Inpatient Hospital Stay: Payer: Medicare HMO

## 2021-07-02 ENCOUNTER — Other Ambulatory Visit: Payer: Self-pay

## 2021-07-02 DIAGNOSIS — Z79899 Other long term (current) drug therapy: Secondary | ICD-10-CM | POA: Diagnosis not present

## 2021-07-02 DIAGNOSIS — Z515 Encounter for palliative care: Secondary | ICD-10-CM | POA: Diagnosis not present

## 2021-07-02 DIAGNOSIS — C3411 Malignant neoplasm of upper lobe, right bronchus or lung: Secondary | ICD-10-CM | POA: Diagnosis not present

## 2021-07-02 DIAGNOSIS — C771 Secondary and unspecified malignant neoplasm of intrathoracic lymph nodes: Secondary | ICD-10-CM | POA: Diagnosis not present

## 2021-07-02 DIAGNOSIS — Z5111 Encounter for antineoplastic chemotherapy: Secondary | ICD-10-CM | POA: Diagnosis not present

## 2021-07-02 DIAGNOSIS — Z66 Do not resuscitate: Secondary | ICD-10-CM | POA: Diagnosis not present

## 2021-07-02 DIAGNOSIS — D539 Nutritional anemia, unspecified: Secondary | ICD-10-CM

## 2021-07-02 DIAGNOSIS — D509 Iron deficiency anemia, unspecified: Secondary | ICD-10-CM | POA: Diagnosis not present

## 2021-07-02 DIAGNOSIS — R5383 Other fatigue: Secondary | ICD-10-CM | POA: Diagnosis not present

## 2021-07-02 DIAGNOSIS — R110 Nausea: Secondary | ICD-10-CM | POA: Diagnosis not present

## 2021-07-02 DIAGNOSIS — C3491 Malignant neoplasm of unspecified part of right bronchus or lung: Secondary | ICD-10-CM

## 2021-07-02 DIAGNOSIS — J910 Malignant pleural effusion: Secondary | ICD-10-CM | POA: Diagnosis not present

## 2021-07-02 MED ORDER — DIPHENHYDRAMINE HCL 25 MG PO CAPS
25.0000 mg | ORAL_CAPSULE | Freq: Once | ORAL | Status: AC
  Administered 2021-07-02: 25 mg via ORAL
  Filled 2021-07-02 (×2): qty 1

## 2021-07-02 MED ORDER — SODIUM CHLORIDE 0.9% IV SOLUTION
250.0000 mL | Freq: Once | INTRAVENOUS | Status: AC
  Administered 2021-07-02: 250 mL via INTRAVENOUS

## 2021-07-02 MED ORDER — HEPARIN SOD (PORK) LOCK FLUSH 100 UNIT/ML IV SOLN
250.0000 [IU] | INTRAVENOUS | Status: AC | PRN
  Administered 2021-07-02: 250 [IU]

## 2021-07-02 MED ORDER — SODIUM CHLORIDE 0.9% FLUSH
3.0000 mL | INTRAVENOUS | Status: AC | PRN
  Administered 2021-07-02: 3 mL

## 2021-07-02 NOTE — Patient Instructions (Signed)
Blood Transfusion, Adult, Care After This sheet gives you information about how to care for yourself after your procedure. Your doctor may also give you more specific instructions. If you have problems or questions, contact your doctor. What can I expect after the procedure? After the procedure, it is common to have: Bruising and soreness at the IV site. A headache. Follow these instructions at home: Insertion site care   Follow instructions from your doctor about how to take care of your insertion site. This is where an IV tube was put into your vein. Make sure you: Wash your hands with soap and water before and after you change your bandage (dressing). If you cannot use soap and water, use hand sanitizer. Change your bandage as told by your doctor. Check your insertion site every day for signs of infection. Check for: Redness, swelling, or pain. Bleeding from the site. Warmth. Pus or a bad smell. General instructions Take over-the-counter and prescription medicines only as told by your doctor. Rest as told by your doctor. Go back to your normal activities as told by your doctor. Keep all follow-up visits as told by your doctor. This is important. Contact a doctor if: You have itching or red, swollen areas of skin (hives). You feel worried or nervous (anxious). You feel weak after doing your normal activities. You have redness, swelling, warmth, or pain around the insertion site. You have blood coming from the insertion site, and the blood does not stop with pressure. You have pus or a bad smell coming from the insertion site. Get help right away if: You have signs of a serious reaction. This may be coming from an allergy or the body's defense system (immune system). Signs include: Trouble breathing or shortness of breath. Swelling of the face or feeling warm (flushed). Fever or chills. Head, chest, or back pain. Dark pee (urine) or blood in the pee. Widespread rash. Fast  heartbeat. Feeling dizzy or light-headed. You may receive your blood transfusion in an outpatient setting. If so, you will be told whom to contact to report any reactions. These symptoms may be an emergency. Do not wait to see if the symptoms will go away. Get medical help right away. Call your local emergency services (911 in the [place] Do not drive yourself to the hospital. Summary Bruising and soreness at the IV site are common. Check your insertion site every day for signs of infection. Rest as told by your doctor. Go back to your normal activities as told by your doctor. Get help right away if you have signs of a serious reaction. This information is not intended to replace advice given to you by your health care provider. Make sure you discuss any questions you have with your health care provider. Document Revised: 09/30/2020 Document Reviewed: 11/28/2018 Elsevier Patient Education  2022 [employer]  

## 2021-07-04 ENCOUNTER — Other Ambulatory Visit (HOSPITAL_COMMUNITY): Payer: Self-pay

## 2021-07-04 ENCOUNTER — Ambulatory Visit (INDEPENDENT_AMBULATORY_CARE_PROVIDER_SITE_OTHER): Payer: Medicare HMO | Admitting: Psychologist

## 2021-07-04 DIAGNOSIS — F320 Major depressive disorder, single episode, mild: Secondary | ICD-10-CM

## 2021-07-04 DIAGNOSIS — R69 Illness, unspecified: Secondary | ICD-10-CM | POA: Diagnosis not present

## 2021-07-04 LAB — TYPE AND SCREEN
ABO/RH(D): O POS
Antibody Screen: NEGATIVE
Unit division: 0
Unit division: 0

## 2021-07-04 LAB — BPAM RBC
Blood Product Expiration Date: 202302132359
Blood Product Expiration Date: 202302132359
ISSUE DATE / TIME: 202301140833
ISSUE DATE / TIME: 202301140833
Unit Type and Rh: 5100
Unit Type and Rh: 5100

## 2021-07-04 NOTE — Telephone Encounter (Signed)
Oral Oncology Patient Advocate Encounter  Prior Authorization for [facility] has been approved.    PA# [id number] Effective dates: 06/19/21 through 06/18/22   Oral Oncology Clinic will continue to follow.   [place] [facility] [place] Phone [phone] Fax [phone] 07/04/2021 3:45 PM

## 2021-07-04 NOTE — Progress Notes (Signed)
[facility] Counselor/Therapist Progress Note  Patient ID: Allison [name], MRN: [id number],    Date: 07/04/2021  Time Spent: 10:03 am to 10:26 am; total time: 23 minutes  This session was held via phone teletherapy due to the coronavirus risk at this time. The patient consented to phone teletherapy and was located at her home during this session. She is aware it is the responsibility of the patient to secure confidentiality on her end of the session. The provider was in a private home office for the duration of this session. Limits of confidentiality were discussed with the patient.   Treatment Type: Individual Therapy  Reported Symptoms: The patient endorsed some anxiety symptoms.   Mental Status Exam: Appearance:  NA      Behavior: Appropriate  Motor: NA  Speech/Language:  Normal Rate  Affect: Appropriate  Mood: normal  Thought process: normal  Thought content:   WNL  Sensory/Perceptual disturbances:   WNL  Orientation: oriented to person, place, time/date, and situation  Attention: Good  Concentration: Good  Memory: WNL  Fund of knowledge:  Good  Insight:   Good  Judgment:  Good  Impulse Control: Good   Risk Assessment: Danger to Self:  No Self-injurious Behavior: No Danger to Others: No Duty to Warn:no Physical Aggression / Violence:No  Access to Firearms a concern: No  Gang Involvement:No   Subjective: Beginning the session, patient asked for a shorter session due to an urgent dental appointment. She asked what is contributing to her experiencing anxiety when she can't think of anything that make contribute to anxiety. After discussing this, topic she asked for a coping strategy. After discussing, processing, and practicing the coping strategy the patient described herself as better. She was agreeable to the homework. She asked to follow up. She denied suicidal and homicidal ideation.    Interventions:  Worked on developing a therapeutic relationship  with the patient using active listening and reflective statements. Provided emotional support using empathy and validation. Reviewed the treatment plan with the patient. Used summary statements. Normalized and validated expressed thoughts and emotions. Provided psychoeducation about how the body experiences anxiety symptoms as a way to protect self. Provided psychoeducation about guided imagery. Practiced and processed guided imagery. Praised patient for experiencing less distress. Assisted in problem solving. Discussed implementing and discussing defusion at the next session.  Assigned homework. Assessed for suicidal and homicidal ideation.  Homework: Implement guided imagery  Next Session: Review homework, defusion, and emotional support.   Diagnosis: F32.0 major depressive affective disorder, single episode, mild  Plan:   Client Abilities: Patient is friendly and easy to develop rapport.   Client Preferences: Patient voiced interest in ACT  Client statement of Needs: Patient stated that she wanted coping skills and to process her emotions.    Treatment Level: Patient would benefit from outpatient therapy and monthly appointments. Depending on the severity of symptoms and prognosis potentially services more consistently.   Symptoms:  Patient endorsed experiencing the following: rumination of negative thoughts, feeling sad, thoughts of hopelessness, social isolation, fatigue, avoiding pleasurable activities, and keeping to self. She denied suicidal and homicidal ideation.   Goals:     Work through the grieving process and face reality of own death Accept emotional support from others around them Live life to the fullest, event though time may be limited Become as knowledgeable about the medical condition  Reduce fear, anxiety about the health condition  Accept the illness  Objectives target date for all objectives is 05/19/2022 Identify  feelings associated with the illness  (12.1.2023) Family members share with each other feelings (12.1.2023) Identify the losses or limitations that have been experienced (12.1.2023) Verbalize acceptance of the reality of the medical condition (12.1.2023) Commit to learning and implement a proactive approach to managing personal stresses (12.1.2023) Work with therapist to develop a plan for coping with stress (12.1.12023) Engage in social, productive activities that are possible (12.1.2023) implement positive imagery (12.1.2023) Identify coping skills and sources of emotional support (12.1.2023) Patient's partner and family members verbalize their fears regarding severity of health condition (12.1.2023) Identify sources of emotional distress (12.1.2023) Learn and implement problem solving strategies (12.1.2023) Identify and engage in pleasant activities (12.1.2023)  Interventions Teach about stress and ways to handle stress Assist the patient in developing a coping action plan for stressors Conduct skills based training for coping strategies Train problem focused skills Sort out what activities the individual can do Encourage patient to rely upon his/her spiritual faith Teach the patient to use guided imagery Probe and evaluate family's ability to provide emotional support Allow family to share their fears Assist the patient in identifying, sorting through, and verbalizing the various feelings generated by his/her medical condition Meet with family members  Ask patient list out limitations  Use stress inoculation training  Use Acceptance and Commitment Therapy to help client accept uncomfortable realities in order to accomplish value-consistent goals Reinforce the client's insight into the role of his/her past emotional pain and present anxiety  Discuss examples demonstrating that unrealistic worry overestimates the probability of threats and underestimates patient's ability  Assist the patient in analyzing his or her  worries Help patient understand that avoidance is reinforcing  Behavioral activation  Help the client explore the relationship, nature of the dispute,  Help the client develop new interpersonal skills and relationships Conduct Problem solving therapy Teach conflict resolution skills Use a process-experiential approach Conduct TLDP Conduct ACT  The patient reviewed the treatment plan on 12.15.2022. The patient approved of the treatment plan.    Conception Chancy, PsyD                  Conception Chancy, PsyD

## 2021-07-05 ENCOUNTER — Other Ambulatory Visit: Payer: Self-pay | Admitting: Internal Medicine

## 2021-07-05 DIAGNOSIS — I10 Essential (primary) hypertension: Secondary | ICD-10-CM | POA: Diagnosis not present

## 2021-07-05 DIAGNOSIS — K219 Gastro-esophageal reflux disease without esophagitis: Secondary | ICD-10-CM | POA: Diagnosis not present

## 2021-07-05 DIAGNOSIS — N959 Unspecified menopausal and perimenopausal disorder: Secondary | ICD-10-CM | POA: Diagnosis not present

## 2021-07-05 DIAGNOSIS — C3491 Malignant neoplasm of unspecified part of right bronchus or lung: Secondary | ICD-10-CM | POA: Diagnosis not present

## 2021-07-05 DIAGNOSIS — E559 Vitamin D deficiency, unspecified: Secondary | ICD-10-CM | POA: Diagnosis not present

## 2021-07-05 DIAGNOSIS — H409 Unspecified glaucoma: Secondary | ICD-10-CM | POA: Diagnosis not present

## 2021-07-05 DIAGNOSIS — I2510 Atherosclerotic heart disease of native coronary artery without angina pectoris: Secondary | ICD-10-CM | POA: Diagnosis not present

## 2021-07-05 DIAGNOSIS — G4700 Insomnia, unspecified: Secondary | ICD-10-CM | POA: Diagnosis not present

## 2021-07-05 DIAGNOSIS — I7300 Raynaud's syndrome without gangrene: Secondary | ICD-10-CM | POA: Diagnosis not present

## 2021-07-05 DIAGNOSIS — R69 Illness, unspecified: Secondary | ICD-10-CM | POA: Diagnosis not present

## 2021-07-05 NOTE — Progress Notes (Signed)
[facility] OFFICE PROGRESS NOTE  [name], [place] [address]  DIAGNOSIS:  Stage IV (T2 a,N2, M1a) non-small cell lung cancer, adenocarcinoma diagnosed in July 2019 and presented with right upper lobe lung mass in addition to mediastinal lymphadenopathy as well as bilateral pulmonary nodules and malignant right pleural effusion.   Biomarker Findings Microsatellite status - Cannot Be Determined Tumor Mutational Burden - Cannot Be Determined Genomic Findings For a complete list of the genes assayed, please refer to the Appendix. EGFR exon 19 deletion ([id number]) TP53 Y220C 7 Disease relevant genes with no reportable alterations: KRAS, ALK, BRAF, MET, RET, ERBB2, ROS1     PRIOR THERAPY: 1) Status post right Pleurx catheter placement by Dr. Prescott Gum for drainage of malignant right pleural effusion. 2) palliative radiotherapy to the enlarging right upper lobe lung mass and mediastinum under the care of Dr. Sondra Come expected to be completed on January 05, 2021. 3) Tagrisso 80 mg p.o. daily.  First dose was given on 01/29/2018.  Status post 39 months of treatment.  CURRENT THERAPY: Systemic chemotherapy with carboplatin for AUC of 5 and Alimta 500 Mg/M2 every 3 weeks.  First dose 03/17/2021.  The patient will also continue her current treatment with Tagrisso 80 mg p.o. daily.  She is status post 5 cycles.   INTERVAL HISTORY: Allison [name] 71 y.o. female returns to the clinic today for a follow-up visit. Her daughters were available by phone.  The patient is feeling fair today without any concerning complaints. She reports she has good days and bad days with regard to her breathing. She had a good day yesterday but had some of her chest tightness and shortness of breath at night. She took her anxiety medication and used her nebulizer and she woke up today feeling well. She tends to feel well in the mornings but feels fatigue by the end of the  day. The patient has been following closely with palliative care which has been helpful for her.  She is currently prescribed Xanax for her anxiety.  She also has been taking Marinol for her appetite. This was last prescribed on 06/15/21, although the patient states her pharmacy did not notify her of this being available. It appears the confirmation was received by her pharmacy.  Her Decadron was adjusted to be 1 tablet twice a day the day before, day of, and the day after treatment. She forgot to take her morning dose yesterday but took her evening dose. The patient has been taking Ensure as recommended by the nutritionist team.  Her weight is stable today. The patient continues to have intermittent chest tightness for which she is taking Tylenol.  She is prescribed oxycodone and MS Contin by palliative care but she has not needed to take this and she tries to avoid narcotics if possible.  She is prescribed Nexium and hyoscyamine.  In the interval since her last appointment, she required 2 units of blood which was administered on 07/02/2021.  She did not notice any improvement in her energy.  She continues to have shortness of breath with minimal exertion although she states her breathing is overall improved compared to prior.  She continues to have a cough but states it produces less mucus than before.  She has nebulizers and Mucinex.  She reports that she has fatigue a few days following treatment.  Denies any fever or chills. She denies intermittent diarrhea. Denies any constipation or vomiting.  She sometimes has nausea  and has not noticed an appreciable improvement with compazine Denies any headaches.  She is expected to have cataract surgery on 07/21/21. The patient recently had a restaging CT scan performed.  She is here today for evaluation to review her scan results before starting cycle #6.   MEDICAL HISTORY: Past Medical History:  Diagnosis Date   Anemia    Anxiety    Arthritis    Asthma     exercise induced   Depression    PMH   Dyspnea    GERD (gastroesophageal reflux disease)    Glaucoma    History of radiation therapy 01/05/2021   IMRT right lung  11/24/2020-01/05/2021  Dr [name]   Hypertension    lung ca dx'd 11/2017   right   Malignant pleural effusion    right   PONV (postoperative nausea and vomiting)    Pre-diabetes    Raynaud's disease    Raynaud's disease    Stroke ([place]) 01/2021   balance off, some express aphasia, weakness    ALLERGIES:  is allergic to penicillins, vicodin [hydrocodone-acetaminophen], and other.  MEDICATIONS:  Current Outpatient Medications  Medication Sig Dispense Refill   magnesium oxide (MAG-OX) 400 (240 Mg) MG tablet Take 1 tablet (400 mg total) by mouth daily. 30 tablet 0   ondansetron (ZOFRAN-ODT) 4 MG disintegrating tablet Take 1 tablet (4 mg total) by mouth every 8 (eight) hours as needed for nausea or vomiting. 30 tablet 1   acetaminophen (TYLENOL) 500 MG tablet Take 1,000 mg by mouth every 6 (six) hours as needed (pain).     ALPRAZolam (XANAX) 0.25 MG tablet Take 1 tablet (0.25 mg total) by mouth 3 (three) times daily as needed for anxiety. 30 tablet 0   amLODipine (NORVASC) 5 MG tablet Take 5 mg by mouth daily.     aspirin 81 MG chewable tablet Chew 1 tablet (81 mg total) by mouth daily.     brompheniramine-pseudoephedrine-DM 30-2-10 MG/5ML syrup Take 5 mLs by mouth 3 (three) times daily as needed. 120 mL 0   carvedilol (COREG) 12.5 MG tablet Take 1 tablet (12.5 mg total) by mouth 2 (two) times daily. 180 tablet 1   cetirizine (ZYRTEC) 5 MG tablet Take 5 mg by mouth daily.     chlorthalidone (HYGROTON) 25 MG tablet TAKE 1 TABLET (25 MG TOTAL) BY MOUTH DAILY. 90 tablet 1   dexamethasone (DECADRON) 4 MG tablet Take 1 tablet twice a day the day before, the day of, and the day after chemotherapy 40 tablet 2   dorzolamide-timolol (COSOPT) 22.3-6.8 MG/ML ophthalmic solution Place 1 drop into both eyes 2 (two) times daily.      dronabinol (MARINOL) 2.5 MG capsule Take 1 capsule (2.5 mg total) by mouth 2 (two) times daily before a meal. 60 capsule 0   enoxaparin (LOVENOX) 60 MG/0.6ML injection Inject 0.6 mLs (60 mg total) into the skin every 12 (twelve) hours. 60 mL 1   esomeprazole (NEXIUM) 20 MG capsule Take 1 capsule (20 mg total) by mouth in the morning and at bedtime. 60 capsule 2   ezetimibe (ZETIA) 10 MG tablet Take 10 mg by mouth daily.      FLUoxetine (PROZAC) 10 MG capsule Take 1 capsule (10 mg total) by mouth daily. 30 capsule 3   folic acid (FOLVITE) 1 MG tablet TAKE 1 TABLET BY MOUTH EVERY DAY 90 tablet 1   hyoscyamine (LEVSIN SL) 0.125 MG SL tablet Place 2 tablets (0.25 mg total) under the tongue every 6 (six)  hours as needed for cramping. Take 1-2 tablets under the tongue every 6 hours as needed for cramping 30 tablet 0   ipratropium-albuterol (DUONEB) 0.5-2.5 (3) MG/3ML SOLN Take 3 mLs by nebulization every 6 (six) hours as needed (Asthma).     isosorbide mononitrate (IMDUR) 30 MG 24 hr tablet Take 1 tablet (30 mg total) by mouth daily. 90 tablet 3   latanoprost (XALATAN) 0.005 % ophthalmic solution Place 1 drop into both eyes at bedtime.      lidocaine (LIDODERM) 5 % Place 1 patch onto the skin daily. Remove & Discard patch within 12 hours or as directed by MD 30 patch 0   osimertinib mesylate (TAGRISSO) 80 MG tablet Take 1 tablet (80 mg total) by mouth daily. 30 tablet 3   OVER THE COUNTER MEDICATION Take 4 drops by mouth daily. Viatmin d     oxyCODONE (OXY IR/ROXICODONE) 5 MG immediate release tablet Take 1 tablet (5 mg total) by mouth every 4 (four) hours as needed for moderate pain. 30 tablet 0   potassium chloride SA (KLOR-CON M) 20 MEQ tablet Take 1 tablet (20 mEq total) by mouth daily. 10 tablet 0   prochlorperazine (COMPAZINE) 10 MG tablet Take 1 tablet (10 mg total) by mouth every 6 (six) hours as needed for nausea or vomiting. 30 tablet 0   REPATHA SURECLICK 354 MG/ML SOAJ Inject 140 mg into the  skin every 14 (fourteen) days. 6 mL 3   sucralfate (CARAFATE) 1 g tablet Take 1 tablet (1 g total) by mouth 4 (four) times daily. with meals and at bedtime. Crush and dissolve in 10 mL of warm water prior to swallowing 120 tablet 0   No current facility-administered medications for this visit.    SURGICAL HISTORY:  Past Surgical History:  Procedure Laterality Date   ABDOMINAL HYSTERECTOMY     partial   BRONCHIAL BIOPSY  04/21/2021   Procedure: BRONCHIAL BIOPSIES;  Surgeon: [name], DO;  Location: [facility];  Service: Pulmonary;;   BRONCHIAL BRUSHINGS  04/21/2021   Procedure: BRONCHIAL BRUSHINGS;  Surgeon: [name], DO;  Location: [facility];  Service: Pulmonary;;   BRONCHIAL NEEDLE ASPIRATION BIOPSY  04/21/2021   Procedure: BRONCHIAL NEEDLE ASPIRATION BIOPSIES;  Surgeon: [name], DO;  Location: [facility];  Service: Pulmonary;;   CHEST TUBE INSERTION Right 01/01/2018   Procedure: INSERTION PLEURAL DRAINAGE CATHETER;  Surgeon: Ivin Poot, MD;  Location: [facility] OR;  Service: Thoracic;  Laterality: Right;   CHEST TUBE INSERTION  04/21/2021   Procedure: CHEST TUBE INSERTION;  Surgeon: [name], DO;  Location: [facility];  Service: Pulmonary;;   COLONOSCOPY     CORONARY STENT INTERVENTION N/A 06/16/2019   Procedure: CORONARY STENT INTERVENTION;  Surgeon: Jettie Booze, MD;  Location: [facility];  Service: Cardiovascular;  Laterality: N/A;   DILATION AND CURETTAGE OF UTERUS     EYE SURGERY     due to Glaucoma   IR IMAGING GUIDED PORT INSERTION  03/22/2021   IR PORT REPAIR CENTRAL VENOUS ACCESS DEVICE  04/15/2021   LEFT HEART CATH AND CORONARY ANGIOGRAPHY N/A 06/16/2019   Procedure: LEFT HEART CATH AND CORONARY ANGIOGRAPHY;  Surgeon: Jettie Booze, MD;  Location: [facility];  Service: Cardiovascular;  Laterality: N/A;   REMOVAL OF PLEURAL DRAINAGE CATHETER Right 11/07/2018   Procedure: REMOVAL OF PLEURAL DRAINAGE CATHETER;   Surgeon: Ivin Poot, MD;  Location: [facility];  Service: Thoracic;  Laterality: Right;  ROTATOR CUFF REPAIR     TUBAL LIGATION     VIDEO BRONCHOSCOPY WITH ENDOBRONCHIAL NAVIGATION Bilateral 04/21/2021   Procedure: VIDEO BRONCHOSCOPY WITH ENDOBRONCHIAL NAVIGATION;  Surgeon: [name], DO;  Location: [facility];  Service: Pulmonary;  Laterality: Bilateral;  ION   WISDOM TOOTH EXTRACTION      REVIEW OF SYSTEMS:   Review of Systems  Constitutional: Positive for fatigue 2 to 3 days following treatment.  Positive for improving appetite. Negative for chills and fever. HENT: Negative for mouth sores, nosebleeds, sore throat and trouble swallowing.   Eyes: Negative for eye problems and icterus.  Respiratory: Positive for dyspnea on exertion and improving but persistent cough.  Negative for hemoptysis and wheezing.   Cardiovascular: Positive for occasional chest tightness.  Negative for leg swelling.  Gastrointestinal: Positive for mild nausea without vomiting. Negative for abdominal pain, diarrhea, constipation, and vomiting.  Genitourinary: Negative for bladder incontinence, difficulty urinating, dysuria, frequency and hematuria.   Musculoskeletal: Negative for back pain, gait problem, neck pain and neck stiffness.  Skin: Negative for itching and rash.  Neurological: Negative for dizziness, extremity weakness, gait problem, headaches, light-headedness and seizures.  Hematological: Negative for adenopathy. Does not bruise/bleed easily.  Psychiatric/Behavioral: Negative for confusion, depression and sleep disturbance. The patient is not nervous/anxious.     PHYSICAL EXAMINATION:  Blood pressure (!) 147/71, pulse 69, temperature 97.9 F (36.6 C), temperature source Axillary, resp. rate 15, height '5\' 4"'  (1.626 m), weight 116 lb (52.6 kg), SpO2 100 %.  ECOG PERFORMANCE STATUS: 1-2  Physical Exam  Constitutional: Oriented to person, place, and time and thin appearing female and in no  distress.  HENT:  Head: Normocephalic and atraumatic.  Mouth/Throat: Oropharynx is clear and moist. No oropharyngeal exudate.  Eyes: Conjunctivae are normal. Right eye exhibits no discharge. Left eye exhibits no discharge. No scleral icterus.  Neck: Normal range of motion. Neck supple.  Cardiovascular: Normal rate, regular rhythm, normal heart sounds and intact distal pulses.   Pulmonary/Chest: Effort normal and breath sounds normal. No respiratory distress. No wheezes. No rales.  Abdominal: Soft. Bowel sounds are normal. Exhibits no distension and no mass. There is no tenderness.  Musculoskeletal: Normal range of motion. Exhibits no edema.  Lymphadenopathy:    No cervical adenopathy.  Neurological: Alert and oriented to person, place, and time.  Muscle wasting.  Gait normal. Coordination normal.  Skin: Skin is warm and dry. No rash noted. Not diaphoretic. No erythema. No pallor.  Psychiatric: Mood, memory and judgment normal.  Vitals reviewed.  LABORATORY DATA: Lab Results  Component Value Date   WBC 2.2 (L) 07/07/2021   HGB 11.9 (L) 07/07/2021   HCT 36.7 07/07/2021   MCV 88.4 07/07/2021   PLT 191 07/07/2021      Chemistry      Component Value Date/Time   NA 138 07/07/2021 0929   NA 140 03/04/2021 1516   K 3.2 (L) 07/07/2021 0929   CL 101 07/07/2021 0929   CO2 28 07/07/2021 0929   BUN 15 07/07/2021 0929   BUN 21 03/04/2021 1516   CREATININE 1.06 (H) 07/07/2021 0929      Component Value Date/Time   CALCIUM 9.8 07/07/2021 0929   ALKPHOS 64 07/07/2021 0929   AST 26 07/07/2021 0929   ALT 24 07/07/2021 0929   BILITOT 0.3 07/07/2021 0929       RADIOGRAPHIC STUDIES:  CT Chest W Contrast  Result Date: 06/30/2021 CLINICAL DATA:  Restaging non-small cell lung cancer. Initial diagnosis 2019. EXAM:  CT CHEST WITH CONTRAST TECHNIQUE: Multidetector CT imaging of the chest was performed during intravenous contrast administration. RADIATION DOSE REDUCTION: This exam was performed  according to the departmental dose-optimization program which includes automated exposure control, adjustment of the mA and/or kV according to patient size and/or use of iterative reconstruction technique. CONTRAST:  76m OMNIPAQUE IOHEXOL 300 MG/ML  SOLN COMPARISON:  Chest CT 04/27/2021 FINDINGS: Cardiovascular: The heart is normal in size. No pericardial effusion. Stable tortuosity and scattered calcification of the thoracic aorta but no focal aneurysm or dissection. Stable coronary artery calcifications. The Port-A-Cath is in good position without complicating features. Mediastinum/Nodes: Stable scattered mediastinal hilar lymph nodes and diffuse soft tissue thickening around the right hilum and in the subcarinal space likely treated tumor. I do not see any new or progressive findings to suggest recurrent disease. The esophagus is grossly normal. Lungs/Pleura: Stable appearing extensive radiation changes involving the right upper lobe and right paramediastinal lung. The right apical lesion measures approximately 21 x 16 mm and previously measured 25 x 17 mm. Stable diffuse and fairly marked pleural thickening and loculated pleural fluid. The loculated pleural fluid collection in the major fissure has near completely resolved. No findings suspicious for pulmonary metastatic disease. Stable calcified granuloma the left lower lobe. Upper Abdomen: No significant upper abdominal findings. No hepatic or adrenal gland lesions. Stable vascular calcifications. Musculoskeletal: No significant bony findings. IMPRESSION: 1. Stable appearing extensive radiation changes involving the right upper lobe and right paramediastinal lung. 2. Stable pleural thickening and pleural fluid. The fluid collection in the right major fissure has resolved. 3. No findings suspicious for pulmonary metastatic disease. 4. Stable mediastinal and hilar lymph nodes. 5. No findings for upper abdominal metastatic disease. Aortic Atherosclerosis  (ICD10-I70.0) and Emphysema (ICD10-J43.9). Electronically Signed   By: PMarijo SanesM.D.   On: 06/30/2021 16:20     ASSESSMENT/PLAN:  This is a very pleasant 71year old never smoker African-American female diagnosed with stage IV non-small cell lung cancer, adenocarcinoma.  She was positive for an EGFR mutation with deletion in exon 19.  She was diagnosed in July 2019 and presented with right upper lobe lung mass in addition to mediastinal lymphadenopathy as well as bilateral pulmonary nodules and malignant right pleural effusion.   The patient was started on treatment with Tagrisso 80 mg p.o. daily status post 39 months of treatment. Started on 01/29/2018.   In April 2022, she showed evidence of disease progression with interval progression of the right apical lung mass in addition to progression of mediastinal lymphadenopathy concerning for worsening of her disease. She had repeat Guardant 360 molecular studies which did not show evidence of new resistant mutations.  Therefore, she was referred to radiation oncology and completed palliative radiotherapy to the enlarging right upper lobe lung mass and mediastinum under the care of Dr. KSondra Come  This was completed in July 2022.  Unfortunately, the patient recently was found to have evidence of disease progression.  Therefore, Dr. MJulien Nordmannstarted the patient on systemic chemotherapy with carboplatin for AUC of 5 and Alimta 500 mg per metered squared.  She is status post 5 cycles and she tolerated it fairly well despite her ongoing issues with fatigue, chest tightness, cough, and weight loss.  She is not a good candidate for Avastin due to her recent CVA in August 2022.  She also is continuing to take Tagrisso as it is protective against progressive metastatic disease to the brain.  The patient underwent a repeat bronchoscopy and biopsy for repeat molecular  testing.  The sample from the left and right lung biopsy was negative for malignancy. This was  performed in November 2022.    The patient saw Dr. Durenda Hurt from [facility] for second opinion in November 2022.  They discussed if she progresses on chemotherapy that there may be upcoming options. He discussed other treatment options which may be available in 2023 including patritumab deruxtcan and lazertinib/amivantmab. If these are not available, he wrote that they can determine if they can get the agents through an expanded access, single patient IND, or clinical trial if she has progression with chemotherapy.   The patient recently had a restaging CT scan performed.  Dr. Julien Nordmann personally and independently reviewed the scan and discussed the results with the patient today.  The scan showed no evidence of disease progression.  Dr. Julien Nordmann recommends the patient continue on the same treatment at the same dose.  She will proceed with cycle #6 today.  She will receive carboplatin and Alimta today.  We will be see her back for follow-up in 3 weeks, she will start single agent maintenance Alimta.  Her ANC is 1.4 today.  Her total white blood cell count is 2.2.  Reviewed with Dr. Julien Nordmann and she is okay to proceed with treatment today as planned.  We will continue to monitor labs weekly until her next appointment.  If she becomes neutropenic, we will call her back to the clinic sooner to discuss G-CSF injections.  Her hemoglobin has significantly improved since having a blood transfusion.  Discussed that it is unlikely that she will need a blood transfusion in the interval; however, I will arrange for sample to blood bank to be drawn just in case she needs a blood transfusion if her hemoglobin drops less than 8.   The patient frequently has hypokalemia.  We have checked magnesium which is low at 1.4.  I sent her prescription for magnesium oxide to take 400 mg once daily.  She still has 5 days left of her potassium supplement.  Her potassium was 3.2.  She will continue her potassium as prescribed.    She will continue using her nebulizers and Mucinex for her cough and shortness of breath.  The patient was given a prescription cough medication by palliative care but she states she has not needed to use this at this time.   She will continue taking Tylenol for pain (chest tightness), but she does have other prescription pain medications if needed.  The patient knows that she has these if needed but she mentally does not like knowing/taking opioid medications.   She will continue taking Xanax for her anxiety as prescribed by palliative care.   Advised to please use imodium if she has diarrhea.  Discussed that magnesium as well of her Tagrisso can cause loose stool.  From my end, it appears that the patient's Marinol was prescribed on 06/15/2021 and a confirmation was received by the pharmacy at 11:17 AM.  The patient will call the pharmacy to follow-up on this.  She is scheduled to see a member the nutritionist team while in the infusion room today.  She is also scheduled to see a palliative care while in the infusion room.  I sent her prescription for sublingual Zofran to take in between her Compazine if this is not effective.  Discussed that Zofran may not be effective within the first 3 days of receiving chemotherapy today.  Discussed that it is okay for her to have her cataract surgery on  07/21/2021.  She will get lab work performed the day before and 07/20/2021.   The patient was advised to call immediately if she has any concerning symptoms in the interval. The patient voices understanding of current disease status and treatment options and is in agreement with the current care plan. All questions were answered. The patient knows to call the clinic with any problems, questions or concerns. We can certainly see the patient much sooner if necessary        Orders Placed This Encounter  Procedures   CBC with Differential ([facility] Only)    Standing Status:   Standing    Number  of Occurrences:   3    Standing Expiration Date:   07/07/2022   CMP ([facility] only)    Standing Status:   Standing    Number of Occurrences:   3    Standing Expiration Date:   07/07/2022   Sample to Blood Bank    Standing Status:   Standing    Number of Occurrences:   3    Standing Expiration Date:   07/07/2022    Tobe Sos Romilda Proby, PA-C 07/07/21  ADDENDUM: Hematology/Oncology Attending: I had a face-to-face encounter with the patient today.  I reviewed her record, lab, scan and recommended her care plan.  This is a very pleasant 71 years old African-American female diagnosed with a stage IV non-small cell lung cancer, adenocarcinoma with positive EGFR mutation with deletion in exon 75 in July 2019.  The patient started treatment with targeted therapy with Tagrisso 80 mg p.o. daily for a total of 39 months.  In April 2022 she had evidence for disease progression with right apical lung mass in addition to mediastinal lymphadenopathy.  The patient underwent palliative radiotherapy to this area complicated with radiation-induced pneumonitis and significant dyspnea.  The patient had repeat molecular studies that showed no new actionable mutations with the persistent of the old EGFR exon 19 deletion. She is started systemic chemotherapy with carboplatin and Alimta status post 5 cycles.  The patient has been tolerating this treatment well.  She was not a candidate for additional treatment with Avastin because of her recent stroke at the time of initiation of the treatment.  She has been tolerating her systemic chemotherapy fairly well no concerning complaints. She had repeat CT scan of the chest, abdomen pelvis performed recently.  I personally and independently reviewed the scan images and discussed the results with the patient and her daughters who were available by phone during the visit. Her scan showed no concerning findings for disease progression and in general she has a stable disease  compared to the previous imaging studies with improvement of the pneumonitis. I recommended for the patient to proceed with cycle #6 today with carboplatin and Alimta and starting from cycle #7 she will be on maintenance treatment with single agent Alimta every 3 weeks. The patient and her family are in agreement with the current plan.  She will come back for follow-up visit in 3 weeks for evaluation before the next cycle of her treatment. She was advised to call immediately if she has any other concerning symptoms in the interval.  The total time spent in the appointment was 40 minutes. Disclaimer: This note was dictated with voice recognition software. Similar sounding words can inadvertently be transcribed and may be missed upon review. Eilleen Kempf, MD 07/07/21

## 2021-07-06 MED FILL — Dexamethasone Sodium Phosphate Inj 100 MG/10ML: INTRAMUSCULAR | Qty: 1 | Status: AC

## 2021-07-06 MED FILL — Fosaprepitant Dimeglumine For IV Infusion 150 MG (Base Eq): INTRAVENOUS | Qty: 5 | Status: AC

## 2021-07-06 NOTE — Telephone Encounter (Signed)
Test

## 2021-07-07 ENCOUNTER — Inpatient Hospital Stay (HOSPITAL_BASED_OUTPATIENT_CLINIC_OR_DEPARTMENT_OTHER): Payer: Medicare HMO | Admitting: Nurse Practitioner

## 2021-07-07 ENCOUNTER — Inpatient Hospital Stay: Payer: Medicare HMO

## 2021-07-07 ENCOUNTER — Inpatient Hospital Stay: Payer: Medicare HMO | Admitting: Nutrition

## 2021-07-07 ENCOUNTER — Encounter: Payer: Self-pay | Admitting: Nurse Practitioner

## 2021-07-07 ENCOUNTER — Inpatient Hospital Stay: Payer: Medicare HMO | Admitting: Physician Assistant

## 2021-07-07 ENCOUNTER — Other Ambulatory Visit: Payer: Self-pay | Admitting: Physician Assistant

## 2021-07-07 ENCOUNTER — Other Ambulatory Visit: Payer: Self-pay

## 2021-07-07 VITALS — BP 147/71 | HR 69 | Temp 97.9°F | Resp 15 | Ht 64.0 in | Wt 116.0 lb

## 2021-07-07 DIAGNOSIS — D509 Iron deficiency anemia, unspecified: Secondary | ICD-10-CM

## 2021-07-07 DIAGNOSIS — Z515 Encounter for palliative care: Secondary | ICD-10-CM | POA: Diagnosis not present

## 2021-07-07 DIAGNOSIS — R112 Nausea with vomiting, unspecified: Secondary | ICD-10-CM | POA: Diagnosis not present

## 2021-07-07 DIAGNOSIS — F419 Anxiety disorder, unspecified: Secondary | ICD-10-CM

## 2021-07-07 DIAGNOSIS — E876 Hypokalemia: Secondary | ICD-10-CM

## 2021-07-07 DIAGNOSIS — C3491 Malignant neoplasm of unspecified part of right bronchus or lung: Secondary | ICD-10-CM

## 2021-07-07 DIAGNOSIS — C3411 Malignant neoplasm of upper lobe, right bronchus or lung: Secondary | ICD-10-CM | POA: Diagnosis not present

## 2021-07-07 DIAGNOSIS — D6481 Anemia due to antineoplastic chemotherapy: Secondary | ICD-10-CM

## 2021-07-07 DIAGNOSIS — Z5111 Encounter for antineoplastic chemotherapy: Secondary | ICD-10-CM | POA: Diagnosis not present

## 2021-07-07 DIAGNOSIS — R5383 Other fatigue: Secondary | ICD-10-CM | POA: Diagnosis not present

## 2021-07-07 DIAGNOSIS — C771 Secondary and unspecified malignant neoplasm of intrathoracic lymph nodes: Secondary | ICD-10-CM | POA: Diagnosis not present

## 2021-07-07 DIAGNOSIS — R110 Nausea: Secondary | ICD-10-CM | POA: Diagnosis not present

## 2021-07-07 DIAGNOSIS — Z79899 Other long term (current) drug therapy: Secondary | ICD-10-CM | POA: Diagnosis not present

## 2021-07-07 DIAGNOSIS — J910 Malignant pleural effusion: Secondary | ICD-10-CM | POA: Diagnosis not present

## 2021-07-07 DIAGNOSIS — Z66 Do not resuscitate: Secondary | ICD-10-CM | POA: Diagnosis not present

## 2021-07-07 DIAGNOSIS — Z95828 Presence of other vascular implants and grafts: Secondary | ICD-10-CM

## 2021-07-07 DIAGNOSIS — R530 Neoplastic (malignant) related fatigue: Secondary | ICD-10-CM | POA: Diagnosis not present

## 2021-07-07 DIAGNOSIS — R69 Illness, unspecified: Secondary | ICD-10-CM | POA: Diagnosis not present

## 2021-07-07 LAB — CMP (CANCER CENTER ONLY)
ALT: 24 U/L (ref 0–44)
AST: 26 U/L (ref 15–41)
Albumin: 3.9 g/dL (ref 3.5–5.0)
Alkaline Phosphatase: 64 U/L (ref 38–126)
Anion gap: 9 (ref 5–15)
BUN: 15 mg/dL (ref 8–23)
CO2: 28 mmol/L (ref 22–32)
Calcium: 9.8 mg/dL (ref 8.9–10.3)
Chloride: 101 mmol/L (ref 98–111)
Creatinine: 1.06 mg/dL — ABNORMAL HIGH (ref 0.44–1.00)
GFR, Estimated: 57 mL/min — ABNORMAL LOW (ref >60)
Glucose, Bld: 134 mg/dL — ABNORMAL HIGH (ref 70–99)
Potassium: 3.2 mmol/L — ABNORMAL LOW (ref 3.5–5.1)
Sodium: 138 mmol/L (ref 135–145)
Total Bilirubin: 0.3 mg/dL (ref 0.3–1.2)
Total Protein: 7.2 g/dL (ref 6.5–8.1)

## 2021-07-07 LAB — CBC WITH DIFFERENTIAL (CANCER CENTER ONLY)
Abs Immature Granulocytes: 0 10*3/uL (ref 0.00–0.07)
Basophils Absolute: 0 10*3/uL (ref 0.0–0.1)
Basophils Relative: 1 %
Eosinophils Absolute: 0 10*3/uL (ref 0.0–0.5)
Eosinophils Relative: 0 %
HCT: 36.7 % (ref 36.0–46.0)
Hemoglobin: 11.9 g/dL — ABNORMAL LOW (ref 12.0–15.0)
Immature Granulocytes: 0 %
Lymphocytes Relative: 18 %
Lymphs Abs: 0.4 10*3/uL — ABNORMAL LOW (ref 0.7–4.0)
MCH: 28.7 pg (ref 26.0–34.0)
MCHC: 32.4 g/dL (ref 30.0–36.0)
MCV: 88.4 fL (ref 80.0–100.0)
Monocytes Absolute: 0.3 10*3/uL (ref 0.1–1.0)
Monocytes Relative: 16 %
Neutro Abs: 1.4 10*3/uL — ABNORMAL LOW (ref 1.7–7.7)
Neutrophils Relative %: 65 %
Platelet Count: 191 10*3/uL (ref 150–400)
RBC: 4.15 MIL/uL (ref 3.87–5.11)
RDW: 16.2 % — ABNORMAL HIGH (ref 11.5–15.5)
WBC Count: 2.2 10*3/uL — ABNORMAL LOW (ref 4.0–10.5)
nRBC: 0 % (ref 0.0–0.2)

## 2021-07-07 LAB — MAGNESIUM: Magnesium: 1.4 mg/dL — ABNORMAL LOW (ref 1.7–2.4)

## 2021-07-07 MED ORDER — SODIUM CHLORIDE 0.9 % IV SOLN
10.0000 mg | Freq: Once | INTRAVENOUS | Status: AC
  Administered 2021-07-07: 10 mg via INTRAVENOUS
  Filled 2021-07-07: qty 10

## 2021-07-07 MED ORDER — CYANOCOBALAMIN 1000 MCG/ML IJ SOLN
1000.0000 ug | Freq: Once | INTRAMUSCULAR | Status: AC
  Administered 2021-07-07: 1000 ug via INTRAMUSCULAR
  Filled 2021-07-07: qty 1

## 2021-07-07 MED ORDER — HEPARIN SOD (PORK) LOCK FLUSH 100 UNIT/ML IV SOLN
500.0000 [IU] | Freq: Once | INTRAVENOUS | Status: AC | PRN
  Administered 2021-07-07: 500 [IU]

## 2021-07-07 MED ORDER — PALONOSETRON HCL INJECTION 0.25 MG/5ML
0.2500 mg | Freq: Once | INTRAVENOUS | Status: AC
  Administered 2021-07-07: 0.25 mg via INTRAVENOUS
  Filled 2021-07-07: qty 5

## 2021-07-07 MED ORDER — ALPRAZOLAM 0.25 MG PO TABS: 0.2500 mg | ORAL_TABLET | Freq: Three times a day (TID) | ORAL | 0 refills | Status: DC | PRN

## 2021-07-07 MED ORDER — SODIUM CHLORIDE 0.9% FLUSH
10.0000 mL | INTRAVENOUS | Status: DC | PRN
  Administered 2021-07-07: 10 mL

## 2021-07-07 MED ORDER — SODIUM CHLORIDE 0.9% FLUSH
10.0000 mL | Freq: Once | INTRAVENOUS | Status: AC
  Administered 2021-07-07: 10 mL

## 2021-07-07 MED ORDER — MAGNESIUM OXIDE -MG SUPPLEMENT 400 (240 MG) MG PO TABS: 400.0000 mg | ORAL_TABLET | Freq: Every day | ORAL | 0 refills | Status: DC

## 2021-07-07 MED ORDER — SODIUM CHLORIDE 0.9 % IV SOLN: Freq: Once | INTRAVENOUS | Status: AC

## 2021-07-07 MED ORDER — SODIUM CHLORIDE 0.9 % IV SOLN
350.0000 mg | Freq: Once | INTRAVENOUS | Status: AC
  Administered 2021-07-07: 350 mg via INTRAVENOUS
  Filled 2021-07-07: qty 35

## 2021-07-07 MED ORDER — SODIUM CHLORIDE 0.9 % IV SOLN
150.0000 mg | Freq: Once | INTRAVENOUS | Status: AC
  Administered 2021-07-07: 150 mg via INTRAVENOUS
  Filled 2021-07-07: qty 150

## 2021-07-07 MED ORDER — SODIUM CHLORIDE 0.9 % IV SOLN
500.0000 mg/m2 | Freq: Once | INTRAVENOUS | Status: AC
  Administered 2021-07-07: 800 mg via INTRAVENOUS
  Filled 2021-07-07: qty 20

## 2021-07-07 MED ORDER — ONDANSETRON 4 MG PO TBDP: 4.0000 mg | ORAL_TABLET | Freq: Three times a day (TID) | ORAL | 1 refills | Status: AC | PRN

## 2021-07-07 MED ORDER — DRONABINOL 2.5 MG PO CAPS: 2.5000 mg | ORAL_CAPSULE | Freq: Two times a day (BID) | ORAL | 0 refills | Status: DC

## 2021-07-07 NOTE — Progress Notes (Signed)
Nutrition follow-up completed with patient during infusion for stage IV non-small cell lung cancer.  Weight is stable overall and was documented as 116 pounds January 19.  Patient weighed 116.2 pounds on January 3.  Labs noted: Magnesium 1.4, potassium 3.2, glucose 134, creatinine 1.06.  Patient reports occasional nausea.  She takes Compazine but it does upset her stomach occasionally.  Provider's ordered Zofran today which patient plans on trying this cycle.  She was instructed how to alternate Compazine and Zofran. Loose stools/diarrhea continues occasionally. Patient is sipping on 1 Ensure a day which has improved loose stools.  Nutrition diagnosis: Unintended weight loss is stable.  Intervention: Continue strategies for increased calories and protein. Continue Ensure at least 1 carton daily.  Try room temperature formula to see if it makes a difference on tolerance. Consider alternate antiemetic (Phenergan) if patient continues to have nausea uncontrolled with Compazine and Zofran. Continue food intake every 2-3 hours. Questions answered.  Teach back method used. Provided coupons at patient request.  Monitoring, evaluation, goals: Patient will tolerate increased calories and protein to minimize weight loss throughout treatment.  Next visit: Tuesday, February 7 during infusion.  **Disclaimer: This note was dictated with voice recognition software. Similar sounding words can inadvertently be transcribed and this note may contain transcription errors which may not have been corrected upon publication of note.**

## 2021-07-07 NOTE — Progress Notes (Signed)
[facility]  [phone] [phone]   Name: Allison [name] Date: 07/07/2021 MRN: [id number]  DOB: [birth year]  Patient Care Team: [name], MD as PCP - General (Internal Medicine) Donato Heinz, MD as PCP - Cardiology (Cardiology) [name], MD as Consulting Physician (Radiation Oncology) Pickenpack-Cousar, Carlena Sax, NP as Nurse Practitioner (Nurse Practitioner)    INTERVAL HISTORY: Allison [name] is a 71 y.o. female with stage IV non-small cell lung cancer (12/2017), hypertension, CVA, CAD, DVT/PE (on Lovenox), and GERD.  Palliative ask to see for symptom management and goals of care.  SOCIAL HISTORY:     reports that she has never smoked. She has never used smokeless tobacco. She reports that she does not currently use alcohol. She reports that she does not use drugs.  ADVANCE DIRECTIVES:  None on file   CODE STATUS: DNR  PAST MEDICAL HISTORY: Past Medical History:  Diagnosis Date   Anemia    Anxiety    Arthritis    Asthma    exercise induced   Depression    PMH   Dyspnea    GERD (gastroesophageal reflux disease)    Glaucoma    History of radiation therapy 01/05/2021   IMRT right lung  11/24/2020-01/05/2021  Dr [name]   Hypertension    lung ca dx'd 11/2017   right   Malignant pleural effusion    right   PONV (postoperative nausea and vomiting)    Pre-diabetes    Raynaud's disease    Raynaud's disease    Stroke ([place]) 01/2021   balance off, some express aphasia, weakness    ALLERGIES:  is allergic to penicillins, vicodin [hydrocodone-acetaminophen], and other.  MEDICATIONS:  Current Outpatient Medications  Medication Sig Dispense Refill   acetaminophen (TYLENOL) 500 MG tablet Take 1,000 mg by mouth every 6 (six) hours as needed (pain).     ALPRAZolam (XANAX) 0.25 MG tablet Take 1 tablet (0.25 mg total) by mouth 3 (three) times daily as needed for anxiety. 30 tablet 0    amLODipine (NORVASC) 5 MG tablet Take 5 mg by mouth daily.     aspirin 81 MG chewable tablet Chew 1 tablet (81 mg total) by mouth daily.     brompheniramine-pseudoephedrine-DM 30-2-10 MG/5ML syrup Take 5 mLs by mouth 3 (three) times daily as needed. 120 mL 0   carvedilol (COREG) 12.5 MG tablet Take 1 tablet (12.5 mg total) by mouth 2 (two) times daily. 180 tablet 1   cetirizine (ZYRTEC) 5 MG tablet Take 5 mg by mouth daily.     chlorthalidone (HYGROTON) 25 MG tablet TAKE 1 TABLET (25 MG TOTAL) BY MOUTH DAILY. 90 tablet 1   dexamethasone (DECADRON) 4 MG tablet Take 1 tablet twice a day the day before, the day of, and the day after chemotherapy 40 tablet 2   dorzolamide-timolol (COSOPT) 22.3-6.8 MG/ML ophthalmic solution Place 1 drop into both eyes 2 (two) times daily.     dronabinol (MARINOL) 2.5 MG capsule Take 1 capsule (2.5 mg total) by mouth 2 (two) times daily before a meal. 60 capsule 0   enoxaparin (LOVENOX) 60 MG/0.6ML injection Inject 0.6 mLs (60 mg total) into the skin every 12 (twelve) hours. 60 mL 1   esomeprazole (NEXIUM) 20 MG capsule Take 1 capsule (20 mg total) by mouth in the morning and at bedtime. 60 capsule 2   ezetimibe (ZETIA) 10 MG tablet Take 10 mg by mouth daily.  FLUoxetine (PROZAC) 10 MG capsule Take 1 capsule (10 mg total) by mouth daily. 30 capsule 3   folic acid (FOLVITE) 1 MG tablet TAKE 1 TABLET BY MOUTH EVERY DAY 90 tablet 1   hyoscyamine (LEVSIN SL) 0.125 MG SL tablet Place 2 tablets (0.25 mg total) under the tongue every 6 (six) hours as needed for cramping. Take 1-2 tablets under the tongue every 6 hours as needed for cramping 30 tablet 0   ipratropium-albuterol (DUONEB) 0.5-2.5 (3) MG/3ML SOLN Take 3 mLs by nebulization every 6 (six) hours as needed (Asthma).     isosorbide mononitrate (IMDUR) 30 MG 24 hr tablet Take 1 tablet (30 mg total) by mouth daily. 90 tablet 3   latanoprost (XALATAN) 0.005 % ophthalmic solution Place 1 drop into both eyes at bedtime.       lidocaine (LIDODERM) 5 % Place 1 patch onto the skin daily. Remove & Discard patch within 12 hours or as directed by MD 30 patch 0   magnesium oxide (MAG-OX) 400 (240 Mg) MG tablet Take 1 tablet (400 mg total) by mouth daily. 30 tablet 0   ondansetron (ZOFRAN-ODT) 4 MG disintegrating tablet Take 1 tablet (4 mg total) by mouth every 8 (eight) hours as needed for nausea or vomiting. 30 tablet 1   osimertinib mesylate (TAGRISSO) 80 MG tablet Take 1 tablet (80 mg total) by mouth daily. 30 tablet 3   OVER THE COUNTER MEDICATION Take 4 drops by mouth daily. Viatmin d     oxyCODONE (OXY IR/ROXICODONE) 5 MG immediate release tablet Take 1 tablet (5 mg total) by mouth every 4 (four) hours as needed for moderate pain. 30 tablet 0   potassium chloride SA (KLOR-CON M) 20 MEQ tablet Take 1 tablet (20 mEq total) by mouth daily. 10 tablet 0   prochlorperazine (COMPAZINE) 10 MG tablet Take 1 tablet (10 mg total) by mouth every 6 (six) hours as needed for nausea or vomiting. 30 tablet 0   REPATHA SURECLICK 384 MG/ML SOAJ Inject 140 mg into the skin every 14 (fourteen) days. 6 mL 3   sucralfate (CARAFATE) 1 g tablet Take 1 tablet (1 g total) by mouth 4 (four) times daily. with meals and at bedtime. Crush and dissolve in 10 mL of warm water prior to swallowing 120 tablet 0   No current facility-administered medications for this visit.   Facility-Administered Medications Ordered in Other Visits  Medication Dose Route Frequency Provider Last Rate Last Admin   sodium chloride flush (NS) 0.9 % injection 10 mL  10 mL Intracatheter PRN Nicholas Lose, MD   10 mL at 07/07/21 1355    VITAL SIGNS: LMP  (LMP Unknown)  There were no vitals filed for this visit.  Estimated body mass index is 19.91 kg/m as calculated from the following:   Height as of an earlier encounter on 07/07/21: '5\' 4"'  (1.626 m).   Weight as of an earlier encounter on 07/07/21: 116 lb (52.6 kg).   PERFORMANCE STATUS (ECOG) : 1 - Symptomatic but  completely ambulatory   Physical Exam General: NAD Cardiovascular: regular rate and rhythm Pulmonary: clear ant fields Abdomen: soft, nontender, + bowel sounds Extremities: no edema, no joint deformities Neurological: AAO x3   IMPRESSION:  I saw Mrs. Ruff today during her infusion for symptom management follow-up.  She continues to show signs of much improvement.  States she is appreciative of how much better she is feeling.  She reports her ability to do chores around the home and get  back to her normal lifestyle despite some minimal symptoms.  Fatigue  Her fatigue is greatly decreased.  Again is able to do activities around the home and outside of the home which has been a challenge for her several months prior.  She does have some increased fatigue depending on the level of activity that she is performing.  Encouraged her to listen to her body and take frequent rest breaks as needed.   Anxiety Stephanine shares her anxiety is much more controlled with the Xanax.  She is taking 2 at bedtime which is allowing her to have a restful night and ability to function better the following day.  Does get somewhat anxious around treatment days however it is manageable at this time.  Discussed anxiousness/nervousness around designated days is most likely anticipatory.  She verbalized understanding and appreciation of ongoing support.   Appetite Appetite continues to improve. States she is eating better. She met with the dietician today. Plans to try saltine crackers in between foods to assist in taste bud change.   I discussed the importance of continued conversation with family and their medical providers regarding overall plan of care and treatment options, ensuring decisions are within the context of the patients values and GOCs.  PLAN: Symptoms are much better managed. Appetite is improving. She is being followed by the dietician.  Continue xanax for anxiety. Refill sent.  I will plan to see  back in 3-4 weeks.    Patient expressed understanding and was in agreement with this plan. She also understands that She can call the clinic at any time with any questions, concerns, or complaints.   Time Total: 25 min  Visit consisted of counseling and education dealing with the complex and emotionally intense issues of symptom management and palliative care in the setting of serious and potentially life-threatening illness.Greater than 50%  of this time was spent counseling and coordinating care related to the above assessment and plan.  Signed by: Alda Lea, AGPCNP-BC [place]

## 2021-07-07 NOTE — Patient Instructions (Signed)
[facility]  Discharge Instructions: Thank you for choosing [facility] to provide your oncology and hematology care.   If you have a lab appointment with the [facility], please go directly to the [facility] and check in at the registration area.   Wear comfortable clothing and clothing appropriate for easy access to any Portacath or PICC line.   We strive to give you quality time with your provider. You may need to reschedule your appointment if you arrive late (15 or more minutes).  Arriving late affects you and other patients whose appointments are after yours.  Also, if you miss three or more appointments without notifying the office, you may be dismissed from the clinic at the providers discretion.      For prescription refill requests, have your pharmacy contact our office and allow 72 hours for refills to be completed.    Today you received the following chemotherapy and/or immunotherapy agents: Pemetrexed (Alimta) and Carboplatin.   To help prevent nausea and vomiting after your treatment, we encourage you to take your nausea medication as directed.  BELOW ARE SYMPTOMS THAT SHOULD BE REPORTED IMMEDIATELY: *FEVER GREATER THAN 100.4 F (38 C) OR HIGHER *CHILLS OR SWEATING *NAUSEA AND VOMITING THAT IS NOT CONTROLLED WITH YOUR NAUSEA MEDICATION *UNUSUAL SHORTNESS OF BREATH *UNUSUAL BRUISING OR BLEEDING *URINARY PROBLEMS (pain or burning when urinating, or frequent urination) *BOWEL PROBLEMS (unusual diarrhea, constipation, pain near the anus) TENDERNESS IN MOUTH AND THROAT WITH OR WITHOUT PRESENCE OF ULCERS (sore throat, sores in mouth, or a toothache) UNUSUAL RASH, SWELLING OR PAIN  UNUSUAL VAGINAL DISCHARGE OR ITCHING   Items with * indicate a potential emergency and should be followed up as soon as possible or go to the Emergency Department if any problems should occur.  Please show the CHEMOTHERAPY ALERT CARD or IMMUNOTHERAPY  ALERT CARD at check-in to the Emergency Department and triage nurse.  Should you have questions after your visit or need to cancel or reschedule your appointment, please contact [facility]  Dept: [phone]  and follow the prompts.  Office hours are 8:00 a.m. to 4:30 p.m. Monday - Friday. Please note that voicemails left after 4:00 p.m. may not be returned until the following business day.  We are closed weekends and major holidays. You have access to a nurse at all times for urgent questions. Please call the main number to the clinic Dept: [phone] and follow the prompts.   For any non-urgent questions, you may also contact your provider using MyChart. We now offer e-Visits for anyone 51 and older to request care online for non-urgent symptoms. For details visit [email or web address].   Also download the MyChart app! Go to the app store, search "MyChart", open the app, select [facility], and log in with your MyChart username and password.  Due to Covid, a mask is required upon entering the hospital/clinic. If you do not have a mask, one will be given to you upon arrival. For doctor visits, patients may have 1 support person aged 20 or older with them. For treatment visits, patients cannot have anyone with them due to current Covid guidelines and our immunocompromised population.

## 2021-07-08 ENCOUNTER — Encounter: Payer: Self-pay | Admitting: Physician Assistant

## 2021-07-08 ENCOUNTER — Encounter: Payer: Self-pay | Admitting: Internal Medicine

## 2021-07-08 DIAGNOSIS — N959 Unspecified menopausal and perimenopausal disorder: Secondary | ICD-10-CM | POA: Diagnosis not present

## 2021-07-08 DIAGNOSIS — I10 Essential (primary) hypertension: Secondary | ICD-10-CM | POA: Diagnosis not present

## 2021-07-08 DIAGNOSIS — I7300 Raynaud's syndrome without gangrene: Secondary | ICD-10-CM | POA: Diagnosis not present

## 2021-07-08 DIAGNOSIS — I2510 Atherosclerotic heart disease of native coronary artery without angina pectoris: Secondary | ICD-10-CM | POA: Diagnosis not present

## 2021-07-08 DIAGNOSIS — K219 Gastro-esophageal reflux disease without esophagitis: Secondary | ICD-10-CM | POA: Diagnosis not present

## 2021-07-08 DIAGNOSIS — R69 Illness, unspecified: Secondary | ICD-10-CM | POA: Diagnosis not present

## 2021-07-08 DIAGNOSIS — H409 Unspecified glaucoma: Secondary | ICD-10-CM | POA: Diagnosis not present

## 2021-07-08 DIAGNOSIS — G4700 Insomnia, unspecified: Secondary | ICD-10-CM | POA: Diagnosis not present

## 2021-07-08 DIAGNOSIS — C3491 Malignant neoplasm of unspecified part of right bronchus or lung: Secondary | ICD-10-CM | POA: Diagnosis not present

## 2021-07-08 DIAGNOSIS — E559 Vitamin D deficiency, unspecified: Secondary | ICD-10-CM | POA: Diagnosis not present

## 2021-07-08 NOTE — Telephone Encounter (Signed)
I spoke with pt and advised of this. Pt expressed understanding of this information.

## 2021-07-13 DIAGNOSIS — E559 Vitamin D deficiency, unspecified: Secondary | ICD-10-CM | POA: Diagnosis not present

## 2021-07-13 DIAGNOSIS — N959 Unspecified menopausal and perimenopausal disorder: Secondary | ICD-10-CM | POA: Diagnosis not present

## 2021-07-13 DIAGNOSIS — I10 Essential (primary) hypertension: Secondary | ICD-10-CM | POA: Diagnosis not present

## 2021-07-13 DIAGNOSIS — H409 Unspecified glaucoma: Secondary | ICD-10-CM | POA: Diagnosis not present

## 2021-07-13 DIAGNOSIS — G4700 Insomnia, unspecified: Secondary | ICD-10-CM | POA: Diagnosis not present

## 2021-07-13 DIAGNOSIS — K219 Gastro-esophageal reflux disease without esophagitis: Secondary | ICD-10-CM | POA: Diagnosis not present

## 2021-07-13 DIAGNOSIS — C3491 Malignant neoplasm of unspecified part of right bronchus or lung: Secondary | ICD-10-CM | POA: Diagnosis not present

## 2021-07-13 DIAGNOSIS — H401131 Primary open-angle glaucoma, bilateral, mild stage: Secondary | ICD-10-CM | POA: Diagnosis not present

## 2021-07-13 DIAGNOSIS — I2510 Atherosclerotic heart disease of native coronary artery without angina pectoris: Secondary | ICD-10-CM | POA: Diagnosis not present

## 2021-07-13 DIAGNOSIS — R69 Illness, unspecified: Secondary | ICD-10-CM | POA: Diagnosis not present

## 2021-07-13 DIAGNOSIS — H16223 Keratoconjunctivitis sicca, not specified as Sjogren's, bilateral: Secondary | ICD-10-CM | POA: Diagnosis not present

## 2021-07-13 DIAGNOSIS — I7300 Raynaud's syndrome without gangrene: Secondary | ICD-10-CM | POA: Diagnosis not present

## 2021-07-13 DIAGNOSIS — H35371 Puckering of macula, right eye: Secondary | ICD-10-CM | POA: Diagnosis not present

## 2021-07-13 DIAGNOSIS — I639 Cerebral infarction, unspecified: Secondary | ICD-10-CM | POA: Diagnosis not present

## 2021-07-14 ENCOUNTER — Other Ambulatory Visit: Payer: Self-pay | Admitting: Physician Assistant

## 2021-07-14 ENCOUNTER — Inpatient Hospital Stay: Payer: Medicare HMO | Admitting: Dietician

## 2021-07-14 ENCOUNTER — Telehealth: Payer: Self-pay | Admitting: Physician Assistant

## 2021-07-14 ENCOUNTER — Inpatient Hospital Stay: Payer: Medicare HMO

## 2021-07-14 ENCOUNTER — Telehealth: Payer: Self-pay | Admitting: Medical Oncology

## 2021-07-14 ENCOUNTER — Other Ambulatory Visit: Payer: Self-pay

## 2021-07-14 VITALS — BP 154/67 | HR 71 | Temp 98.0°F | Resp 18

## 2021-07-14 DIAGNOSIS — Z515 Encounter for palliative care: Secondary | ICD-10-CM | POA: Diagnosis not present

## 2021-07-14 DIAGNOSIS — R5383 Other fatigue: Secondary | ICD-10-CM | POA: Diagnosis not present

## 2021-07-14 DIAGNOSIS — Z95828 Presence of other vascular implants and grafts: Secondary | ICD-10-CM

## 2021-07-14 DIAGNOSIS — C771 Secondary and unspecified malignant neoplasm of intrathoracic lymph nodes: Secondary | ICD-10-CM | POA: Diagnosis not present

## 2021-07-14 DIAGNOSIS — T451X5A Adverse effect of antineoplastic and immunosuppressive drugs, initial encounter: Secondary | ICD-10-CM

## 2021-07-14 DIAGNOSIS — Z5111 Encounter for antineoplastic chemotherapy: Secondary | ICD-10-CM | POA: Diagnosis not present

## 2021-07-14 DIAGNOSIS — Z66 Do not resuscitate: Secondary | ICD-10-CM | POA: Diagnosis not present

## 2021-07-14 DIAGNOSIS — J910 Malignant pleural effusion: Secondary | ICD-10-CM | POA: Diagnosis not present

## 2021-07-14 DIAGNOSIS — C3411 Malignant neoplasm of upper lobe, right bronchus or lung: Secondary | ICD-10-CM | POA: Diagnosis not present

## 2021-07-14 DIAGNOSIS — R110 Nausea: Secondary | ICD-10-CM | POA: Diagnosis not present

## 2021-07-14 DIAGNOSIS — D6481 Anemia due to antineoplastic chemotherapy: Secondary | ICD-10-CM

## 2021-07-14 DIAGNOSIS — Z79899 Other long term (current) drug therapy: Secondary | ICD-10-CM | POA: Diagnosis not present

## 2021-07-14 DIAGNOSIS — D509 Iron deficiency anemia, unspecified: Secondary | ICD-10-CM | POA: Diagnosis not present

## 2021-07-14 LAB — CBC WITH DIFFERENTIAL (CANCER CENTER ONLY)
Abs Immature Granulocytes: 0.01 10*3/uL (ref 0.00–0.07)
Basophils Absolute: 0 10*3/uL (ref 0.0–0.1)
Basophils Relative: 1 %
Eosinophils Absolute: 0 10*3/uL (ref 0.0–0.5)
Eosinophils Relative: 1 %
HCT: 37.1 % (ref 36.0–46.0)
Hemoglobin: 11.9 g/dL — ABNORMAL LOW (ref 12.0–15.0)
Immature Granulocytes: 1 %
Lymphocytes Relative: 31 %
Lymphs Abs: 0.3 10*3/uL — ABNORMAL LOW (ref 0.7–4.0)
MCH: 28.7 pg (ref 26.0–34.0)
MCHC: 32.1 g/dL (ref 30.0–36.0)
MCV: 89.6 fL (ref 80.0–100.0)
Monocytes Absolute: 0.1 10*3/uL (ref 0.1–1.0)
Monocytes Relative: 12 %
Neutro Abs: 0.6 10*3/uL — ABNORMAL LOW (ref 1.7–7.7)
Neutrophils Relative %: 54 %
Platelet Count: 123 10*3/uL — ABNORMAL LOW (ref 150–400)
RBC: 4.14 MIL/uL (ref 3.87–5.11)
RDW: 15.3 % (ref 11.5–15.5)
WBC Count: 1.1 10*3/uL — ABNORMAL LOW (ref 4.0–10.5)
nRBC: 0 % (ref 0.0–0.2)

## 2021-07-14 LAB — CMP (CANCER CENTER ONLY)
ALT: 21 U/L (ref 0–44)
AST: 25 U/L (ref 15–41)
Albumin: 3.9 g/dL (ref 3.5–5.0)
Alkaline Phosphatase: 58 U/L (ref 38–126)
Anion gap: 8 (ref 5–15)
BUN: 16 mg/dL (ref 8–23)
CO2: 27 mmol/L (ref 22–32)
Calcium: 9.3 mg/dL (ref 8.9–10.3)
Chloride: 100 mmol/L (ref 98–111)
Creatinine: 1.04 mg/dL — ABNORMAL HIGH (ref 0.44–1.00)
GFR, Estimated: 58 mL/min — ABNORMAL LOW (ref >60)
Glucose, Bld: 115 mg/dL — ABNORMAL HIGH (ref 70–99)
Potassium: 3.6 mmol/L (ref 3.5–5.1)
Sodium: 135 mmol/L (ref 135–145)
Total Bilirubin: 0.4 mg/dL (ref 0.3–1.2)
Total Protein: 7 g/dL (ref 6.5–8.1)

## 2021-07-14 LAB — SAMPLE TO BLOOD BANK

## 2021-07-14 MED ORDER — HEPARIN SOD (PORK) LOCK FLUSH 100 UNIT/ML IV SOLN
500.0000 [IU] | Freq: Once | INTRAVENOUS | Status: AC
  Administered 2021-07-14: 500 [IU] via INTRAVENOUS

## 2021-07-14 MED ORDER — SODIUM CHLORIDE 0.9% FLUSH
10.0000 mL | Freq: Once | INTRAVENOUS | Status: AC
  Administered 2021-07-14: 10 mL via INTRAVENOUS

## 2021-07-14 MED ORDER — FILGRASTIM-SNDZ 300 MCG/0.5ML IJ SOSY
300.0000 ug | PREFILLED_SYRINGE | Freq: Once | INTRAMUSCULAR | Status: AC
  Administered 2021-07-14: 300 ug via SUBCUTANEOUS
  Filled 2021-07-14: qty 0.5

## 2021-07-14 NOTE — Telephone Encounter (Signed)
Her WBC is low at 1.1 and her ANC is 0.6. I have placed orders for zarxio. The prior authorization is in process. I would like for her to receive 2-3 days. I called and left a voicemail of this information. Advised to call back if she has further questions.

## 2021-07-14 NOTE — Telephone Encounter (Signed)
I let the patient know that the zarxio was authorized. I have sent a scheduling message to bring her in today, tomorrow, and Saturday. We reviewed the Claritin injections. We also discussed her upcoming cataract surgery next week on 2/2. I expect her WBC to improve and it should not cause any delays with her cataract surgery. Additionally, she will have repeat labs on 2/1. She expressed understanding. I have sent a scheduling message to bring her in before her nutrition class.

## 2021-07-14 NOTE — Telephone Encounter (Signed)
Coming in today for GCFS. Rationale explained .

## 2021-07-15 ENCOUNTER — Encounter: Payer: Self-pay | Admitting: Physician Assistant

## 2021-07-15 ENCOUNTER — Encounter: Payer: Self-pay | Admitting: Internal Medicine

## 2021-07-15 ENCOUNTER — Inpatient Hospital Stay: Payer: Medicare HMO

## 2021-07-15 VITALS — BP 144/84 | HR 92 | Temp 98.2°F | Resp 18

## 2021-07-15 DIAGNOSIS — C771 Secondary and unspecified malignant neoplasm of intrathoracic lymph nodes: Secondary | ICD-10-CM | POA: Diagnosis not present

## 2021-07-15 DIAGNOSIS — R5383 Other fatigue: Secondary | ICD-10-CM | POA: Diagnosis not present

## 2021-07-15 DIAGNOSIS — H409 Unspecified glaucoma: Secondary | ICD-10-CM | POA: Diagnosis not present

## 2021-07-15 DIAGNOSIS — Z515 Encounter for palliative care: Secondary | ICD-10-CM | POA: Diagnosis not present

## 2021-07-15 DIAGNOSIS — R69 Illness, unspecified: Secondary | ICD-10-CM | POA: Diagnosis not present

## 2021-07-15 DIAGNOSIS — R110 Nausea: Secondary | ICD-10-CM | POA: Diagnosis not present

## 2021-07-15 DIAGNOSIS — Z79899 Other long term (current) drug therapy: Secondary | ICD-10-CM | POA: Diagnosis not present

## 2021-07-15 DIAGNOSIS — I7300 Raynaud's syndrome without gangrene: Secondary | ICD-10-CM | POA: Diagnosis not present

## 2021-07-15 DIAGNOSIS — I2510 Atherosclerotic heart disease of native coronary artery without angina pectoris: Secondary | ICD-10-CM | POA: Diagnosis not present

## 2021-07-15 DIAGNOSIS — C3491 Malignant neoplasm of unspecified part of right bronchus or lung: Secondary | ICD-10-CM | POA: Diagnosis not present

## 2021-07-15 DIAGNOSIS — K219 Gastro-esophageal reflux disease without esophagitis: Secondary | ICD-10-CM | POA: Diagnosis not present

## 2021-07-15 DIAGNOSIS — N959 Unspecified menopausal and perimenopausal disorder: Secondary | ICD-10-CM | POA: Diagnosis not present

## 2021-07-15 DIAGNOSIS — Z95828 Presence of other vascular implants and grafts: Secondary | ICD-10-CM

## 2021-07-15 DIAGNOSIS — I10 Essential (primary) hypertension: Secondary | ICD-10-CM | POA: Diagnosis not present

## 2021-07-15 DIAGNOSIS — C3411 Malignant neoplasm of upper lobe, right bronchus or lung: Secondary | ICD-10-CM | POA: Diagnosis not present

## 2021-07-15 DIAGNOSIS — Z5111 Encounter for antineoplastic chemotherapy: Secondary | ICD-10-CM | POA: Diagnosis not present

## 2021-07-15 DIAGNOSIS — E559 Vitamin D deficiency, unspecified: Secondary | ICD-10-CM | POA: Diagnosis not present

## 2021-07-15 DIAGNOSIS — G4700 Insomnia, unspecified: Secondary | ICD-10-CM | POA: Diagnosis not present

## 2021-07-15 DIAGNOSIS — J910 Malignant pleural effusion: Secondary | ICD-10-CM | POA: Diagnosis not present

## 2021-07-15 DIAGNOSIS — Z66 Do not resuscitate: Secondary | ICD-10-CM | POA: Diagnosis not present

## 2021-07-15 DIAGNOSIS — D509 Iron deficiency anemia, unspecified: Secondary | ICD-10-CM | POA: Diagnosis not present

## 2021-07-15 MED ORDER — FILGRASTIM-SNDZ 300 MCG/0.5ML IJ SOSY
300.0000 ug | PREFILLED_SYRINGE | Freq: Once | INTRAMUSCULAR | Status: AC
  Administered 2021-07-15: 300 ug via SUBCUTANEOUS
  Filled 2021-07-15: qty 0.5

## 2021-07-15 NOTE — Progress Notes (Signed)
Patient attended Nutrition 101 Class on 07/14/2021.   Allison [name], [place]

## 2021-07-16 ENCOUNTER — Other Ambulatory Visit: Payer: Self-pay

## 2021-07-16 ENCOUNTER — Inpatient Hospital Stay: Payer: Medicare HMO

## 2021-07-16 VITALS — BP 151/81 | HR 82 | Temp 98.2°F | Resp 16

## 2021-07-16 DIAGNOSIS — C3411 Malignant neoplasm of upper lobe, right bronchus or lung: Secondary | ICD-10-CM | POA: Diagnosis not present

## 2021-07-16 DIAGNOSIS — Z66 Do not resuscitate: Secondary | ICD-10-CM | POA: Diagnosis not present

## 2021-07-16 DIAGNOSIS — Z95828 Presence of other vascular implants and grafts: Secondary | ICD-10-CM

## 2021-07-16 DIAGNOSIS — D509 Iron deficiency anemia, unspecified: Secondary | ICD-10-CM | POA: Diagnosis not present

## 2021-07-16 DIAGNOSIS — R5383 Other fatigue: Secondary | ICD-10-CM | POA: Diagnosis not present

## 2021-07-16 DIAGNOSIS — Z515 Encounter for palliative care: Secondary | ICD-10-CM | POA: Diagnosis not present

## 2021-07-16 DIAGNOSIS — R110 Nausea: Secondary | ICD-10-CM | POA: Diagnosis not present

## 2021-07-16 DIAGNOSIS — J910 Malignant pleural effusion: Secondary | ICD-10-CM | POA: Diagnosis not present

## 2021-07-16 DIAGNOSIS — Z79899 Other long term (current) drug therapy: Secondary | ICD-10-CM | POA: Diagnosis not present

## 2021-07-16 DIAGNOSIS — C771 Secondary and unspecified malignant neoplasm of intrathoracic lymph nodes: Secondary | ICD-10-CM | POA: Diagnosis not present

## 2021-07-16 DIAGNOSIS — Z5111 Encounter for antineoplastic chemotherapy: Secondary | ICD-10-CM | POA: Diagnosis not present

## 2021-07-16 MED ORDER — FILGRASTIM-SNDZ 300 MCG/0.5ML IJ SOSY
300.0000 ug | PREFILLED_SYRINGE | Freq: Once | INTRAMUSCULAR | Status: AC
  Administered 2021-07-16: 300 ug via SUBCUTANEOUS

## 2021-07-16 NOTE — Patient Instructions (Signed)
Filgrastim, G-CSF injection What is this medication? FILGRASTIM, G-CSF (fil GRA stim) is a granulocyte colony-stimulating factor that stimulates the growth of neutrophils, a type of white blood cell (WBC) important in the body's fight against infection. It is used to reduce the incidence of fever and infection in patients with certain types of cancer who are receiving chemotherapy that affects the bone marrow, to stimulate blood cell production for removal of WBCs from the body prior to a bone marrow transplantation, to reduce the incidence of fever and infection in patients who have severe chronic neutropenia, and to improve survival outcomes following high-dose radiation exposure that is toxic to the bone marrow. This medicine may be used for other purposes; ask your health care provider or pharmacist if you have questions. COMMON BRAND NAME(S): Neupogen, Nivestym, Releuko, Zarxio What should I tell my care team before I take this medication? They need to know if you have any of these conditions: kidney disease latex allergy ongoing radiation therapy sickle cell disease an unusual or allergic reaction to filgrastim, pegfilgrastim, other medicines, foods, dyes, or preservatives pregnant or trying to get pregnant breast-feeding How should I use this medication? This medicine is for injection under the skin or infusion into a vein. As an infusion into a vein, it is usually given by a health care professional in a hospital or clinic setting. If you get this medicine at home, you will be taught how to prepare and give this medicine. Refer to the Instructions for Use that come with your medication packaging. Use exactly as directed. Take your medicine at regular intervals. Do not take your medicine more often than directed. It is important that you put your used needles and syringes in a special sharps container. Do not put them in a trash can. If you do not have a sharps container, call your pharmacist  or healthcare provider to get one. Talk to your pediatrician regarding the use of this medicine in children. While this drug may be prescribed for children as young as 7 months for selected conditions, precautions do apply. Overdosage: If you think you have taken too much of this medicine contact a poison control center or emergency room at once. NOTE: This medicine is only for you. Do not share this medicine with others. What if I miss a dose? It is important not to miss your dose. Call your doctor or health care professional if you miss a dose. What may interact with this medication? This medicine may interact with the following medications: medicines that may cause a release of neutrophils, such as lithium This list may not describe all possible interactions. Give your health care provider a list of all the medicines, herbs, non-prescription drugs, or dietary supplements you use. Also tell them if you smoke, drink alcohol, or use illegal drugs. Some items may interact with your medicine. What should I watch for while using this medication? Your condition will be monitored carefully while you are receiving this medicine. You may need blood work done while you are taking this medicine. Talk to your health care provider about your risk of cancer. You may be more at risk for certain types of cancer if you take this medicine. What side effects may I notice from receiving this medication? Side effects that you should report to your doctor or health care professional as soon as possible: allergic reactions like skin rash, itching or hives, swelling of the face, lips, or tongue back pain dizziness or feeling faint fever pain, redness, or  irritation at site where injected pinpoint red spots on the skin shortness of breath or breathing problems signs and symptoms of kidney injury like trouble passing urine, change in the amount of urine, or red or dark-brown urine stomach or side pain, or pain at  the shoulder swelling tiredness unusual bleeding or bruising Side effects that usually do not require medical attention (report to your doctor or health care professional if they continue or are bothersome): bone pain cough diarrhea hair loss headache muscle pain This list may not describe all possible side effects. Call your doctor for medical advice about side effects. You may report side effects to FDA at 1-800-FDA-1088. Where should I keep my medication? Keep out of the reach of children. Store in a refrigerator between 2 and 8 degrees C (36 and 46 degrees F). Do not freeze. Keep in carton to protect from light. Throw away this medicine if vials or syringes are left out of the refrigerator for more than 24 hours. Throw away any unused medicine after the expiration date. NOTE: This sheet is a summary. It may not cover all possible information. If you have questions about this medicine, talk to your doctor, pharmacist, or health care provider.  2022 Elsevier/Gold Standard (2021-02-22 00:00:00)

## 2021-07-18 ENCOUNTER — Ambulatory Visit (INDEPENDENT_AMBULATORY_CARE_PROVIDER_SITE_OTHER): Payer: Medicare HMO | Admitting: Psychologist

## 2021-07-18 DIAGNOSIS — I10 Essential (primary) hypertension: Secondary | ICD-10-CM | POA: Diagnosis not present

## 2021-07-18 DIAGNOSIS — K219 Gastro-esophageal reflux disease without esophagitis: Secondary | ICD-10-CM | POA: Diagnosis not present

## 2021-07-18 DIAGNOSIS — E559 Vitamin D deficiency, unspecified: Secondary | ICD-10-CM | POA: Diagnosis not present

## 2021-07-18 DIAGNOSIS — R69 Illness, unspecified: Secondary | ICD-10-CM | POA: Diagnosis not present

## 2021-07-18 DIAGNOSIS — C3491 Malignant neoplasm of unspecified part of right bronchus or lung: Secondary | ICD-10-CM | POA: Diagnosis not present

## 2021-07-18 DIAGNOSIS — G4700 Insomnia, unspecified: Secondary | ICD-10-CM | POA: Diagnosis not present

## 2021-07-18 DIAGNOSIS — H409 Unspecified glaucoma: Secondary | ICD-10-CM | POA: Diagnosis not present

## 2021-07-18 DIAGNOSIS — I7300 Raynaud's syndrome without gangrene: Secondary | ICD-10-CM | POA: Diagnosis not present

## 2021-07-18 DIAGNOSIS — F320 Major depressive disorder, single episode, mild: Secondary | ICD-10-CM

## 2021-07-18 DIAGNOSIS — I2510 Atherosclerotic heart disease of native coronary artery without angina pectoris: Secondary | ICD-10-CM | POA: Diagnosis not present

## 2021-07-18 DIAGNOSIS — N959 Unspecified menopausal and perimenopausal disorder: Secondary | ICD-10-CM | POA: Diagnosis not present

## 2021-07-18 NOTE — Progress Notes (Signed)
[place] Counselor/Therapist Progress Note  Patient ID: AMEENAH PROSSER, MRN: [id number],    Date: 07/18/2021  Time Spent: 9:04 am to 9:46 am; total time: 42 minutes  This session was held via phone teletherapy due to the coronavirus risk at this time. The patient consented to phone teletherapy and was located at her home during this session. She is aware it is the responsibility of the patient to secure confidentiality on her end of the session. The provider was in a private home office for the duration of this session. Limits of confidentiality were discussed with the patient.   Treatment Type: Individual Therapy  Reported Symptoms: The patient endorsed anxiety and depressive symptoms.   Mental Status Exam: Appearance:  NA      Behavior: Appropriate  Motor: NA  Speech/Language:  Normal Rate  Affect: Appropriate  Mood: normal  Thought process: normal  Thought content:   WNL  Sensory/Perceptual disturbances:   WNL  Orientation: oriented to person, place, time/date, and situation  Attention: Good  Concentration: Good  Memory: WNL  Fund of knowledge:  Good  Insight:   Good  Judgment:  Good  Impulse Control: Good   Risk Assessment: Danger to Self:  No Self-injurious Behavior: No Danger to Others: No Duty to Warn:no Physical Aggression / Violence:No  Access to Firearms a concern: No  Gang Involvement:No   Subjective: Beginning the session, patient reflected on events since the last session. She voiced that she had a difficult time with guided imagery due to the reading materials, and was appreciative of an alternative way to experience guided imagery. From there, she voiced distress over thoughts she experiences and participated in defusion, which helped a little. She ended the session reflecting on the theme of being a "burden". She explored whether she was a burden or if it was the cancer that is a burden. She decided to name the cancer ED. She was agreeable  to homework and following up. She denied suicidal and homicidal ideation.  Interventions:  Worked on developing a therapeutic relationship with the patient using active listening and reflective statements. Provided emotional support using empathy and validation. Reviewed events since the last session. Validated expressed emotions and thoughts. Normalized and validated patient's experience with guided imagery. Provided psychoeducation about guided imagery videos at the beach. Assisted in problem solving with guided imagery. Provided psychoeducation about defusion. Practiced and processed defusion. Praised patient for experiencing less distress. Identified the theme of "being a burden". Processed thoughts and emotions with that theme. Challenged patient's thoughts about being a burden. Used socratic questions to assist the patient gain insight into self. Reflected on renaming the cancer as the cancer is the burden. Provided empathic statements. Assigned homework. Assessed for suicidal and homicidal ideation.  Homework: Use guided imagery videos and defusion. Implement the name ED for cancer  Next Session: Review homework, emotional support  Diagnosis: F32.0 major depressive affective disorder, single episode, mild  Plan:   Client Abilities: Patient is friendly and easy to develop rapport.   Client Preferences: Patient voiced interest in ACT  Client statement of Needs: Patient stated that she wanted coping skills and to process her emotions.    Treatment Level: Patient would benefit from outpatient therapy and monthly appointments. Depending on the severity of symptoms and prognosis potentially services more consistently.   Symptoms:  Patient endorsed experiencing the following: rumination of negative thoughts, feeling sad, thoughts of hopelessness, social isolation, fatigue, avoiding pleasurable activities, and keeping to self. She denied suicidal and  homicidal ideation.   Goals:     Work  through the grieving process and face reality of own death Accept emotional support from others around them Live life to the fullest, event though time may be limited Become as knowledgeable about the medical condition  Reduce fear, anxiety about the health condition  Accept the illness  Objectives target date for all objectives is 05/19/2022 Identify feelings associated with the illness (12.1.2023) Family members share with each other feelings (12.1.2023) Identify the losses or limitations that have been experienced (12.1.2023) Verbalize acceptance of the reality of the medical condition (12.1.2023) Commit to learning and implement a proactive approach to managing personal stresses (12.1.2023) Work with therapist to develop a plan for coping with stress (12.1.12023) Engage in social, productive activities that are possible (12.1.2023) implement positive imagery (12.1.2023) Identify coping skills and sources of emotional support (12.1.2023) Patient's partner and family members verbalize their fears regarding severity of health condition (12.1.2023) Identify sources of emotional distress (12.1.2023) Learn and implement problem solving strategies (12.1.2023) Identify and engage in pleasant activities (12.1.2023)  Interventions Teach about stress and ways to handle stress Assist the patient in developing a coping action plan for stressors Conduct skills based training for coping strategies Train problem focused skills Sort out what activities the individual can do Encourage patient to rely upon his/her spiritual faith Teach the patient to use guided imagery Probe and evaluate family's ability to provide emotional support Allow family to share their fears Assist the patient in identifying, sorting through, and verbalizing the various feelings generated by his/her medical condition Meet with family members  Ask patient list out limitations  Use stress inoculation training  Use  Acceptance and Commitment Therapy to help client accept uncomfortable realities in order to accomplish value-consistent goals Reinforce the client's insight into the role of his/her past emotional pain and present anxiety  Discuss examples demonstrating that unrealistic worry overestimates the probability of threats and underestimates patient's ability  Assist the patient in analyzing his or her worries Help patient understand that avoidance is reinforcing  Behavioral activation  Help the client explore the relationship, nature of the dispute,  Help the client develop new interpersonal skills and relationships Conduct Problem solving therapy Teach conflict resolution skills Use a process-experiential approach Conduct TLDP Conduct ACT  The patient reviewed the treatment plan on 12.15.2022. The patient approved of the treatment plan.    Conception Chancy, PsyD                  Conception Chancy, PsyD               Conception Chancy, PsyD

## 2021-07-20 ENCOUNTER — Other Ambulatory Visit: Payer: Self-pay

## 2021-07-20 ENCOUNTER — Inpatient Hospital Stay: Payer: Medicare HMO

## 2021-07-20 ENCOUNTER — Inpatient Hospital Stay: Payer: Medicare HMO | Attending: Internal Medicine

## 2021-07-20 VITALS — BP 135/72 | HR 70 | Temp 98.0°F | Resp 18 | Ht 64.0 in | Wt 116.0 lb

## 2021-07-20 DIAGNOSIS — R55 Syncope and collapse: Secondary | ICD-10-CM | POA: Diagnosis not present

## 2021-07-20 DIAGNOSIS — Z79891 Long term (current) use of opiate analgesic: Secondary | ICD-10-CM | POA: Diagnosis not present

## 2021-07-20 DIAGNOSIS — F419 Anxiety disorder, unspecified: Secondary | ICD-10-CM | POA: Insufficient documentation

## 2021-07-20 DIAGNOSIS — F32A Depression, unspecified: Secondary | ICD-10-CM | POA: Insufficient documentation

## 2021-07-20 DIAGNOSIS — E876 Hypokalemia: Secondary | ICD-10-CM | POA: Insufficient documentation

## 2021-07-20 DIAGNOSIS — Z5111 Encounter for antineoplastic chemotherapy: Secondary | ICD-10-CM | POA: Insufficient documentation

## 2021-07-20 DIAGNOSIS — E559 Vitamin D deficiency, unspecified: Secondary | ICD-10-CM | POA: Diagnosis not present

## 2021-07-20 DIAGNOSIS — Z95828 Presence of other vascular implants and grafts: Secondary | ICD-10-CM

## 2021-07-20 DIAGNOSIS — R0602 Shortness of breath: Secondary | ICD-10-CM | POA: Diagnosis not present

## 2021-07-20 DIAGNOSIS — Z79899 Other long term (current) drug therapy: Secondary | ICD-10-CM | POA: Insufficient documentation

## 2021-07-20 DIAGNOSIS — R059 Cough, unspecified: Secondary | ICD-10-CM | POA: Diagnosis not present

## 2021-07-20 DIAGNOSIS — I7300 Raynaud's syndrome without gangrene: Secondary | ICD-10-CM | POA: Diagnosis not present

## 2021-07-20 DIAGNOSIS — I10 Essential (primary) hypertension: Secondary | ICD-10-CM | POA: Insufficient documentation

## 2021-07-20 DIAGNOSIS — I2510 Atherosclerotic heart disease of native coronary artery without angina pectoris: Secondary | ICD-10-CM | POA: Diagnosis not present

## 2021-07-20 DIAGNOSIS — D6481 Anemia due to antineoplastic chemotherapy: Secondary | ICD-10-CM

## 2021-07-20 DIAGNOSIS — G893 Neoplasm related pain (acute) (chronic): Secondary | ICD-10-CM | POA: Diagnosis not present

## 2021-07-20 DIAGNOSIS — C3491 Malignant neoplasm of unspecified part of right bronchus or lung: Secondary | ICD-10-CM | POA: Diagnosis not present

## 2021-07-20 DIAGNOSIS — K30 Functional dyspepsia: Secondary | ICD-10-CM | POA: Insufficient documentation

## 2021-07-20 DIAGNOSIS — R69 Illness, unspecified: Secondary | ICD-10-CM | POA: Diagnosis not present

## 2021-07-20 DIAGNOSIS — R5383 Other fatigue: Secondary | ICD-10-CM | POA: Insufficient documentation

## 2021-07-20 DIAGNOSIS — G4700 Insomnia, unspecified: Secondary | ICD-10-CM | POA: Diagnosis not present

## 2021-07-20 DIAGNOSIS — Z8673 Personal history of transient ischemic attack (TIA), and cerebral infarction without residual deficits: Secondary | ICD-10-CM | POA: Insufficient documentation

## 2021-07-20 DIAGNOSIS — C3411 Malignant neoplasm of upper lobe, right bronchus or lung: Secondary | ICD-10-CM | POA: Insufficient documentation

## 2021-07-20 DIAGNOSIS — H409 Unspecified glaucoma: Secondary | ICD-10-CM | POA: Diagnosis not present

## 2021-07-20 DIAGNOSIS — Z923 Personal history of irradiation: Secondary | ICD-10-CM | POA: Insufficient documentation

## 2021-07-20 DIAGNOSIS — K219 Gastro-esophageal reflux disease without esophagitis: Secondary | ICD-10-CM | POA: Insufficient documentation

## 2021-07-20 DIAGNOSIS — N959 Unspecified menopausal and perimenopausal disorder: Secondary | ICD-10-CM | POA: Diagnosis not present

## 2021-07-20 LAB — CBC WITH DIFFERENTIAL (CANCER CENTER ONLY)
Abs Immature Granulocytes: 0.05 10*3/uL (ref 0.00–0.07)
Basophils Absolute: 0 10*3/uL (ref 0.0–0.1)
Basophils Relative: 0 %
Eosinophils Absolute: 0 10*3/uL (ref 0.0–0.5)
Eosinophils Relative: 0 %
HCT: 33.8 % — ABNORMAL LOW (ref 36.0–46.0)
Hemoglobin: 10.7 g/dL — ABNORMAL LOW (ref 12.0–15.0)
Immature Granulocytes: 1 %
Lymphocytes Relative: 13 %
Lymphs Abs: 0.6 10*3/uL — ABNORMAL LOW (ref 0.7–4.0)
MCH: 28.2 pg (ref 26.0–34.0)
MCHC: 31.7 g/dL (ref 30.0–36.0)
MCV: 89.2 fL (ref 80.0–100.0)
Monocytes Absolute: 1 10*3/uL (ref 0.1–1.0)
Monocytes Relative: 20 %
Neutro Abs: 3.3 10*3/uL (ref 1.7–7.7)
Neutrophils Relative %: 66 %
Platelet Count: 69 10*3/uL — ABNORMAL LOW (ref 150–400)
RBC: 3.79 MIL/uL — ABNORMAL LOW (ref 3.87–5.11)
RDW: 15.3 % (ref 11.5–15.5)
WBC Count: 4.9 10*3/uL (ref 4.0–10.5)
nRBC: 0 % (ref 0.0–0.2)

## 2021-07-20 LAB — CMP (CANCER CENTER ONLY)
ALT: 19 U/L (ref 0–44)
AST: 23 U/L (ref 15–41)
Albumin: 4 g/dL (ref 3.5–5.0)
Alkaline Phosphatase: 77 U/L (ref 38–126)
Anion gap: 10 (ref 5–15)
BUN: 13 mg/dL (ref 8–23)
CO2: 28 mmol/L (ref 22–32)
Calcium: 9.3 mg/dL (ref 8.9–10.3)
Chloride: 101 mmol/L (ref 98–111)
Creatinine: 1.21 mg/dL — ABNORMAL HIGH (ref 0.44–1.00)
GFR, Estimated: 48 mL/min — ABNORMAL LOW (ref >60)
Glucose, Bld: 88 mg/dL (ref 70–99)
Potassium: 3.3 mmol/L — ABNORMAL LOW (ref 3.5–5.1)
Sodium: 139 mmol/L (ref 135–145)
Total Bilirubin: 0.3 mg/dL (ref 0.3–1.2)
Total Protein: 6.9 g/dL (ref 6.5–8.1)

## 2021-07-20 LAB — SAMPLE TO BLOOD BANK

## 2021-07-20 MED ORDER — SODIUM CHLORIDE 0.9% FLUSH
10.0000 mL | Freq: Once | INTRAVENOUS | Status: AC
  Administered 2021-07-20: 10 mL

## 2021-07-20 MED ORDER — HEPARIN SOD (PORK) LOCK FLUSH 100 UNIT/ML IV SOLN
500.0000 [IU] | Freq: Once | INTRAVENOUS | Status: AC
  Administered 2021-07-20: 500 [IU] via INTRAVENOUS

## 2021-07-20 NOTE — Patient Instructions (Signed)

## 2021-07-21 ENCOUNTER — Other Ambulatory Visit: Payer: Self-pay

## 2021-07-21 DIAGNOSIS — H2511 Age-related nuclear cataract, right eye: Secondary | ICD-10-CM | POA: Diagnosis not present

## 2021-07-25 ENCOUNTER — Encounter (INDEPENDENT_AMBULATORY_CARE_PROVIDER_SITE_OTHER): Payer: Self-pay

## 2021-07-25 DIAGNOSIS — N959 Unspecified menopausal and perimenopausal disorder: Secondary | ICD-10-CM | POA: Diagnosis not present

## 2021-07-25 DIAGNOSIS — G4700 Insomnia, unspecified: Secondary | ICD-10-CM | POA: Diagnosis not present

## 2021-07-25 DIAGNOSIS — K219 Gastro-esophageal reflux disease without esophagitis: Secondary | ICD-10-CM | POA: Diagnosis not present

## 2021-07-25 DIAGNOSIS — I7300 Raynaud's syndrome without gangrene: Secondary | ICD-10-CM | POA: Diagnosis not present

## 2021-07-25 DIAGNOSIS — E559 Vitamin D deficiency, unspecified: Secondary | ICD-10-CM | POA: Diagnosis not present

## 2021-07-25 DIAGNOSIS — C3491 Malignant neoplasm of unspecified part of right bronchus or lung: Secondary | ICD-10-CM | POA: Diagnosis not present

## 2021-07-25 DIAGNOSIS — H409 Unspecified glaucoma: Secondary | ICD-10-CM | POA: Diagnosis not present

## 2021-07-25 DIAGNOSIS — R69 Illness, unspecified: Secondary | ICD-10-CM | POA: Diagnosis not present

## 2021-07-25 DIAGNOSIS — I2510 Atherosclerotic heart disease of native coronary artery without angina pectoris: Secondary | ICD-10-CM | POA: Diagnosis not present

## 2021-07-25 DIAGNOSIS — I10 Essential (primary) hypertension: Secondary | ICD-10-CM | POA: Diagnosis not present

## 2021-07-26 ENCOUNTER — Ambulatory Visit: Payer: Medicare HMO

## 2021-07-26 ENCOUNTER — Encounter: Payer: Medicare HMO | Admitting: Nutrition

## 2021-07-26 ENCOUNTER — Ambulatory Visit: Payer: Medicare HMO | Admitting: Internal Medicine

## 2021-07-26 ENCOUNTER — Other Ambulatory Visit: Payer: Medicare HMO

## 2021-07-26 NOTE — Progress Notes (Signed)
[facility] OFFICE PROGRESS NOTE  [name], [place] [address]  DIAGNOSIS: Stage IV (T2 a,N2, M1a) non-small cell lung cancer, adenocarcinoma diagnosed in July 2019 and presented with right upper lobe lung mass in addition to mediastinal lymphadenopathy as well as bilateral pulmonary nodules and malignant right pleural effusion.   Biomarker Findings Microsatellite status - Cannot Be Determined Tumor Mutational Burden - Cannot Be Determined Genomic Findings For a complete list of the genes assayed, please refer to the Appendix. EGFR exon 19 deletion ([id number]) TP53 Y220C 7 Disease relevant genes with no reportable alterations: KRAS, ALK, BRAF, MET, RET, ERBB2, ROS1   PRIOR THERAPY:  1) Status post right Pleurx catheter placement by Dr. Prescott Gum for drainage of malignant right pleural effusion. 2) palliative radiotherapy to the enlarging right upper lobe lung mass and mediastinum under the care of Dr. Sondra Come expected to be completed on January 05, 2021. 3) Tagrisso 80 mg p.o. daily.  First dose was given on 01/29/2018.  Status post 39 months of treatment.  CURRENT THERAPY: Systemic chemotherapy with carboplatin for AUC of 5 and Alimta 500 Mg/M2 every 3 weeks.  First dose 03/17/2021.  The patient will also continue her current treatment with Tagrisso 80 mg p.o. daily.  She is status post 6 cycles. Starting from cycle #7, she will be on Alimta only 500 mg/m2.   INTERVAL HISTORY: Allison [name] 71 y.o. female returns to the clinic today for a follow-up visit. Her daughters were available by phone.  The patient is feeling fair today without any new concerning complaints. She reports she has good days and bad days with regard to her breathing but she is able to manage it better as she understands why her breathing is the way it is. She tends to feel well in the mornings but feels fatigue by the end of the day. The patient has been following  closely with palliative care which has been helpful for her.  She is currently prescribed Xanax for her anxiety.  She also has been taking Marinol for her appetite. The patient has been taking Ensure as recommended by the nutritionist team, although she is getting sick of the taste.  Her weight is stable today. She also recently attended the nutrition 101 class. The patient continues to have intermittent chest tightness for which she is taking Tylenol.  She is prescribed oxycodone and MS Contin by palliative care but she has not needed to take this and she tries to avoid narcotics if possible.  She is prescribed Nexium and hyoscyamine for abdominal discomfort. She is wondering if it is ok to take nexium with the decadron in the mornings.   In the interval since her lat appointment, she required zarxio for neutropenia. She denies any recent signs or symptoms of infection. She continues to have shortness of breath with minimal exertion although she states her breathing is overall improved compared to prior. She recently saw Dr. Valeta Harms from pulmonology yesterday. He reviewed her scans with her in depth to explain her symptoms which the patient appreciated. She continues to have a cough but states it produces less mucus than before. She is going to try to hydrate more to help with the phlegm.  She has nebulizers and Mucinex.  She reports that she has fatigue a few days following treatment.  Denies any fever or chills. She has had some nausea without vomiting. Denies any constipation or vomiting. She is here today for evaluation to  review her scan results before starting cycle #7.  MEDICAL HISTORY: Past Medical History:  Diagnosis Date   Anemia    Anxiety    Arthritis    Asthma    exercise induced   Depression    PMH   Dyspnea    GERD (gastroesophageal reflux disease)    Glaucoma    History of radiation therapy 01/05/2021   IMRT right lung  11/24/2020-01/05/2021  Dr [name]   Hypertension    lung ca  dx'd 11/2017   right   Malignant pleural effusion    right   PONV (postoperative nausea and vomiting)    Pre-diabetes    Raynaud's disease    Raynaud's disease    Stroke ([facility]) 01/2021   balance off, some express aphasia, weakness    ALLERGIES:  is allergic to penicillins, vicodin [hydrocodone-acetaminophen], and other.  MEDICATIONS:  Current Outpatient Medications  Medication Sig Dispense Refill   acetaminophen (TYLENOL) 500 MG tablet Take 1,000 mg by mouth every 6 (six) hours as needed (pain).     ALPRAZolam (XANAX) 0.25 MG tablet Take 1 tablet (0.25 mg total) by mouth 3 (three) times daily as needed for anxiety. May take 2 tablets (0.5 mg total) at bedtime. 60 tablet 0   amLODipine (NORVASC) 5 MG tablet Take 5 mg by mouth daily.     aspirin 81 MG chewable tablet Chew 1 tablet (81 mg total) by mouth daily.     brompheniramine-pseudoephedrine-DM 30-2-10 MG/5ML syrup Take 5 mLs by mouth 3 (three) times daily as needed. 120 mL 0   carvedilol (COREG) 12.5 MG tablet Take 1 tablet (12.5 mg total) by mouth 2 (two) times daily. 180 tablet 1   cetirizine (ZYRTEC) 5 MG tablet Take 5 mg by mouth daily.     chlorthalidone (HYGROTON) 25 MG tablet TAKE 1 TABLET (25 MG TOTAL) BY MOUTH DAILY. 90 tablet 1   dexamethasone (DECADRON) 4 MG tablet Take 1 tablet twice a day the day before, the day of, and the day after chemotherapy 40 tablet 2   dorzolamide-timolol (COSOPT) 22.3-6.8 MG/ML ophthalmic solution Place 1 drop into both eyes 2 (two) times daily.     dronabinol (MARINOL) 2.5 MG capsule Take 1 capsule (2.5 mg total) by mouth 2 (two) times daily before a meal. 60 capsule 0   enoxaparin (LOVENOX) 60 MG/0.6ML injection Inject 0.6 mLs (60 mg total) into the skin every 12 (twelve) hours. 60 mL 1   esomeprazole (NEXIUM) 20 MG capsule Take 1 capsule (20 mg total) by mouth in the morning and at bedtime. 60 capsule 2   ezetimibe (ZETIA) 10 MG tablet Take 10 mg by mouth daily.      FLUoxetine (PROZAC) 10 MG  capsule Take 1 capsule (10 mg total) by mouth daily. 30 capsule 3   folic acid (FOLVITE) 1 MG tablet TAKE 1 TABLET BY MOUTH EVERY DAY 90 tablet 1   hyoscyamine (LEVSIN SL) 0.125 MG SL tablet Place 2 tablets (0.25 mg total) under the tongue every 6 (six) hours as needed for cramping. Take 1-2 tablets under the tongue every 6 hours as needed for cramping 30 tablet 0   ipratropium-albuterol (DUONEB) 0.5-2.5 (3) MG/3ML SOLN Take 3 mLs by nebulization every 6 (six) hours as needed (Asthma).     isosorbide mononitrate (IMDUR) 30 MG 24 hr tablet Take 1 tablet (30 mg total) by mouth daily. 90 tablet 3   latanoprost (XALATAN) 0.005 % ophthalmic solution Place 1 drop into both eyes at bedtime.  lidocaine (LIDODERM) 5 % Place 1 patch onto the skin daily. Remove & Discard patch within 12 hours or as directed by MD 30 patch 0   magnesium oxide (MAG-OX) 400 (240 Mg) MG tablet Take 1 tablet (400 mg total) by mouth daily. 30 tablet 0   ondansetron (ZOFRAN-ODT) 4 MG disintegrating tablet Take 1 tablet (4 mg total) by mouth every 8 (eight) hours as needed for nausea or vomiting. 30 tablet 1   osimertinib mesylate (TAGRISSO) 80 MG tablet Take 1 tablet (80 mg total) by mouth daily. 30 tablet 3   OVER THE COUNTER MEDICATION Take 4 drops by mouth daily. Viatmin d     oxyCODONE (OXY IR/ROXICODONE) 5 MG immediate release tablet Take 1 tablet (5 mg total) by mouth every 4 (four) hours as needed for moderate pain. 30 tablet 0   potassium chloride SA (KLOR-CON M) 20 MEQ tablet Take 1 tablet (20 mEq total) by mouth daily. 10 tablet 0   prochlorperazine (COMPAZINE) 10 MG tablet Take 1 tablet (10 mg total) by mouth every 6 (six) hours as needed for nausea or vomiting. 30 tablet 0   REPATHA SURECLICK 481 MG/ML SOAJ Inject 140 mg into the skin every 14 (fourteen) days. 6 mL 3   sucralfate (CARAFATE) 1 g tablet Take 1 tablet (1 g total) by mouth 4 (four) times daily. with meals and at bedtime. Crush and dissolve in 10 mL of warm  water prior to swallowing 120 tablet 0   No current facility-administered medications for this visit.    SURGICAL HISTORY:  Past Surgical History:  Procedure Laterality Date   ABDOMINAL HYSTERECTOMY     partial   BRONCHIAL BIOPSY  04/21/2021   Procedure: BRONCHIAL BIOPSIES;  Surgeon: [name], DO;  Location: [facility];  Service: Pulmonary;;   BRONCHIAL BRUSHINGS  04/21/2021   Procedure: BRONCHIAL BRUSHINGS;  Surgeon: [name], DO;  Location: [facility];  Service: Pulmonary;;   BRONCHIAL NEEDLE ASPIRATION BIOPSY  04/21/2021   Procedure: BRONCHIAL NEEDLE ASPIRATION BIOPSIES;  Surgeon: [name], DO;  Location: [facility];  Service: Pulmonary;;   CHEST TUBE INSERTION Right 01/01/2018   Procedure: INSERTION PLEURAL DRAINAGE CATHETER;  Surgeon: Ivin Poot, MD;  Location: [facility] OR;  Service: Thoracic;  Laterality: Right;   CHEST TUBE INSERTION  04/21/2021   Procedure: CHEST TUBE INSERTION;  Surgeon: [name], DO;  Location: [facility];  Service: Pulmonary;;   COLONOSCOPY     CORONARY STENT INTERVENTION N/A 06/16/2019   Procedure: CORONARY STENT INTERVENTION;  Surgeon: Jettie Booze, MD;  Location: [facility];  Service: Cardiovascular;  Laterality: N/A;   DILATION AND CURETTAGE OF UTERUS     EYE SURGERY     due to Glaucoma   IR IMAGING GUIDED PORT INSERTION  03/22/2021   IR PORT REPAIR CENTRAL VENOUS ACCESS DEVICE  04/15/2021   LEFT HEART CATH AND CORONARY ANGIOGRAPHY N/A 06/16/2019   Procedure: LEFT HEART CATH AND CORONARY ANGIOGRAPHY;  Surgeon: Jettie Booze, MD;  Location: [facility];  Service: Cardiovascular;  Laterality: N/A;   REMOVAL OF PLEURAL DRAINAGE CATHETER Right 11/07/2018   Procedure: REMOVAL OF PLEURAL DRAINAGE CATHETER;  Surgeon: Ivin Poot, MD;  Location: [facility];  Service: Thoracic;  Laterality: Right;   ROTATOR CUFF REPAIR     TUBAL LIGATION     VIDEO BRONCHOSCOPY WITH ENDOBRONCHIAL NAVIGATION  Bilateral 04/21/2021   Procedure: VIDEO BRONCHOSCOPY WITH ENDOBRONCHIAL NAVIGATION;  Surgeon: [name], DO;  Location: [facility];  Service: Pulmonary;  Laterality: Bilateral;  ION   WISDOM TOOTH EXTRACTION      REVIEW OF SYSTEMS:   Constitutional: Positive for fatigue 2 to 3 days following treatment.  Positive for improving appetite. Negative for chills and fever. HENT: Negative for mouth sores, nosebleeds, sore throat and trouble swallowing.   Eyes: Negative for eye problems and icterus.  Respiratory: Positive for dyspnea on exertion and improving but persistent cough.  Negative for hemoptysis and wheezing.   Cardiovascular: Positive for occasional chest tightness.  Negative for leg swelling.  Gastrointestinal: Positive for mild nausea without vomiting. Negative for abdominal pain, diarrhea, constipation, and vomiting.  Genitourinary: Negative for bladder incontinence, difficulty urinating, dysuria, frequency and hematuria.   Musculoskeletal: Negative for back pain, gait problem, neck pain and neck stiffness.  Skin: Negative for itching and rash.  Neurological: Negative for dizziness, extremity weakness, gait problem, headaches, light-headedness and seizures.  Hematological: Negative for adenopathy. Does not bruise/bleed easily.  Psychiatric/Behavioral: Negative for confusion, depression and sleep disturbance. The patient is not nervous/anxious.    PHYSICAL EXAMINATION:  Blood pressure (!) 145/86, pulse (!) 58, temperature 98 F (36.7 C), temperature source Oral, resp. rate 17, height '5\' 4"'  (1.626 m), weight 115 lb 11.2 oz (52.5 kg), SpO2 100 %.  ECOG PERFORMANCE STATUS: 1-2  Physical Exam  Constitutional: Oriented to person, place, and time and thin appearing female and in no distress.  HENT:  Head: Normocephalic and atraumatic.  Mouth/Throat: Oropharynx is clear and moist. No oropharyngeal exudate.  Eyes: Conjunctivae are normal. Right eye exhibits no discharge. Left eye  exhibits no discharge. No scleral icterus.  Neck: Normal range of motion. Neck supple.  Cardiovascular: Normal rate, regular rhythm, normal heart sounds and intact distal pulses.   Pulmonary/Chest: Effort normal and breath sounds normal. No respiratory distress. No wheezes. No rales.  Abdominal: Soft. Bowel sounds are normal. Exhibits no distension and no mass. There is no tenderness.  Musculoskeletal: Normal range of motion. Exhibits no edema.  Lymphadenopathy:    No cervical adenopathy.  Neurological: Alert and oriented to person, place, and time.  Muscle wasting.  Gait normal. Coordination normal.  Skin: Skin is warm and dry. No rash noted. Not diaphoretic. No erythema. No pallor.  Psychiatric: Mood, memory and judgment normal.  Vitals reviewed.  LABORATORY DATA: Lab Results  Component Value Date   WBC 5.0 07/28/2021   HGB 9.8 (L) 07/28/2021   HCT 29.8 (L) 07/28/2021   MCV 87.4 07/28/2021   PLT 134 (L) 07/28/2021      Chemistry      Component Value Date/Time   NA 139 07/20/2021 1055   NA 140 03/04/2021 1516   K 3.3 (L) 07/20/2021 1055   CL 101 07/20/2021 1055   CO2 28 07/20/2021 1055   BUN 13 07/20/2021 1055   BUN 21 03/04/2021 1516   CREATININE 1.21 (H) 07/20/2021 1055      Component Value Date/Time   CALCIUM 9.3 07/20/2021 1055   ALKPHOS 77 07/20/2021 1055   AST 23 07/20/2021 1055   ALT 19 07/20/2021 1055   BILITOT 0.3 07/20/2021 1055       RADIOGRAPHIC STUDIES:  CT Chest W Contrast  Result Date: 06/30/2021 CLINICAL DATA:  Restaging non-small cell lung cancer. Initial diagnosis 2019. EXAM: CT CHEST WITH CONTRAST TECHNIQUE: Multidetector CT imaging of the chest was performed during intravenous contrast administration. RADIATION DOSE REDUCTION: This exam was performed according to the departmental dose-optimization program which includes automated exposure control, adjustment  of the mA and/or kV according to patient size and/or use of iterative reconstruction  technique. CONTRAST:  66m OMNIPAQUE IOHEXOL 300 MG/ML  SOLN COMPARISON:  Chest CT 04/27/2021 FINDINGS: Cardiovascular: The heart is normal in size. No pericardial effusion. Stable tortuosity and scattered calcification of the thoracic aorta but no focal aneurysm or dissection. Stable coronary artery calcifications. The Port-A-Cath is in good position without complicating features. Mediastinum/Nodes: Stable scattered mediastinal hilar lymph nodes and diffuse soft tissue thickening around the right hilum and in the subcarinal space likely treated tumor. I do not see any new or progressive findings to suggest recurrent disease. The esophagus is grossly normal. Lungs/Pleura: Stable appearing extensive radiation changes involving the right upper lobe and right paramediastinal lung. The right apical lesion measures approximately 21 x 16 mm and previously measured 25 x 17 mm. Stable diffuse and fairly marked pleural thickening and loculated pleural fluid. The loculated pleural fluid collection in the major fissure has near completely resolved. No findings suspicious for pulmonary metastatic disease. Stable calcified granuloma the left lower lobe. Upper Abdomen: No significant upper abdominal findings. No hepatic or adrenal gland lesions. Stable vascular calcifications. Musculoskeletal: No significant bony findings. IMPRESSION: 1. Stable appearing extensive radiation changes involving the right upper lobe and right paramediastinal lung. 2. Stable pleural thickening and pleural fluid. The fluid collection in the right major fissure has resolved. 3. No findings suspicious for pulmonary metastatic disease. 4. Stable mediastinal and hilar lymph nodes. 5. No findings for upper abdominal metastatic disease. Aortic Atherosclerosis (ICD10-I70.0) and Emphysema (ICD10-J43.9). Electronically Signed   By: PMarijo SanesM.D.   On: 06/30/2021 16:20     ASSESSMENT/PLAN:  This is a very pleasant 71year old never smoker  African-American female diagnosed with stage IV non-small cell lung cancer, adenocarcinoma.  She was positive for an EGFR mutation with deletion in exon 19.  She was diagnosed in July 2019 and presented with right upper lobe lung mass in addition to mediastinal lymphadenopathy as well as bilateral pulmonary nodules and malignant right pleural effusion.  The patient was started on treatment with Tagrisso 80 mg p.o. daily status post 39 months of treatment. Started on 01/29/2018.   In April 2022, she showed evidence of disease progression with interval progression of the right apical lung mass in addition to progression of mediastinal lymphadenopathy concerning for worsening of her disease. She had repeat Guardant 360 molecular studies which did not show evidence of new resistant mutations.  Therefore, she was referred to radiation oncology and completed palliative radiotherapy to the enlarging right upper lobe lung mass and mediastinum under the care of Dr. KSondra Come  This was completed in July 2022.   Unfortunately, the patient recently was found to have evidence of disease progression.  Therefore, Dr. MJulien Nordmannstarted the patient on systemic chemotherapy with carboplatin for AUC of 5 and Alimta 500 mg per metered squared.  She is status post 6 cycles and she tolerated it fairly well despite her ongoing issues with fatigue, chest tightness, cough, and weight loss.  She is not a good candidate for Avastin due to her recent CVA in August 2022.  She also is continuing to take Tagrisso as it is protective against progressive metastatic disease to the brain. Starting from cycle #7 today, she will start maintenance single agent alimta.   The patient underwent a repeat bronchoscopy and biopsy for repeat molecular testing.  The sample from the left and right lung biopsy was negative for malignancy. This was performed in November 2022.  The patient saw Dr. Durenda Hurt from [facility] for second opinion in  November 2022.  They discussed if she progresses on chemotherapy that there may be upcoming options. He discussed other treatment options which may be available in 2023 including patritumab deruxtcan and lazertinib/amivantmab. If these are not available, he wrote that they can determine if they can get the agents through an expanded access, single patient IND, or clinical trial if she has progression with chemotherapy.    Labs were reviewed. She will proceed with cycle #7 today. She will start single agent maintenance Alimta. Discussed she no longer needs weekly labs and her infusions should be shorter. Hopefully she no longer will need supportive measures such as zarxio for neutropenia and blood transfusions now that she is only on Alimta. However, I will add another sample to blood bank with her next lab draw to ensure no further decline in her Hbg that would warrant a blood transfusion. We would consider blood transfusion if her Hbg <8.   I have refilled her carafate. She reports she still has some stomach discomfort intermittently. Discussed she can see if it helps but she likely does not need to use it regularly. She can also take nexium with/before decadron. Discussed the decadron can sometimes cause stomach upset. I also have refilled her potassium for her hypokalemia.   She will continue using her nebulizers and Mucinex for her cough and shortness of breath.  The patient was given a prescription cough medication by palliative care but she states she has not needed to use this at this time.   She will continue taking Tylenol for pain (chest tightness), but she does have other prescription pain medications if needed.  The patient knows that she has these if needed but she mentally does not like knowing/taking opioid medications.  She will continue taking Xanax for her anxiety as prescribed by palliative care.  The patient was advised to call immediately if she has any concerning symptoms in the  interval. The patient voices understanding of current disease status and treatment options and is in agreement with the current care plan. All questions were answered. The patient knows to call the clinic with any problems, questions or concerns. We can certainly see the patient much sooner if necessary    Orders Placed This Encounter  Procedures   CBC with Differential ([facility] Only)    Standing Status:   Standing    Number of Occurrences:   20    Standing Expiration Date:   07/28/2022   CMP ([facility] only)    Standing Status:   Standing    Number of Occurrences:   20    Standing Expiration Date:   07/28/2022   Sample to Blood Bank    Standing Status:   Standing    Number of Occurrences:   2    Standing Expiration Date:   07/28/2022     The total time spent in the appointment was 20-29 minutes.   Conor Filsaime L Vikas Wegmann, PA-C 07/28/21

## 2021-07-27 ENCOUNTER — Ambulatory Visit: Payer: Medicare HMO

## 2021-07-27 ENCOUNTER — Ambulatory Visit: Payer: Medicare HMO | Admitting: Internal Medicine

## 2021-07-27 ENCOUNTER — Other Ambulatory Visit: Payer: Medicare HMO

## 2021-07-27 ENCOUNTER — Encounter: Payer: Self-pay | Admitting: Pulmonary Disease

## 2021-07-27 ENCOUNTER — Ambulatory Visit: Payer: Medicare HMO | Admitting: Pulmonary Disease

## 2021-07-27 ENCOUNTER — Other Ambulatory Visit: Payer: Self-pay

## 2021-07-27 VITALS — BP 112/52 | HR 72 | Temp 97.7°F | Ht 64.0 in | Wt 114.2 lb

## 2021-07-27 DIAGNOSIS — I7300 Raynaud's syndrome without gangrene: Secondary | ICD-10-CM | POA: Diagnosis not present

## 2021-07-27 DIAGNOSIS — C3490 Malignant neoplasm of unspecified part of unspecified bronchus or lung: Secondary | ICD-10-CM | POA: Diagnosis not present

## 2021-07-27 DIAGNOSIS — N959 Unspecified menopausal and perimenopausal disorder: Secondary | ICD-10-CM | POA: Diagnosis not present

## 2021-07-27 DIAGNOSIS — R052 Subacute cough: Secondary | ICD-10-CM | POA: Diagnosis not present

## 2021-07-27 DIAGNOSIS — C3491 Malignant neoplasm of unspecified part of right bronchus or lung: Secondary | ICD-10-CM | POA: Diagnosis not present

## 2021-07-27 DIAGNOSIS — G4700 Insomnia, unspecified: Secondary | ICD-10-CM | POA: Diagnosis not present

## 2021-07-27 DIAGNOSIS — H409 Unspecified glaucoma: Secondary | ICD-10-CM | POA: Diagnosis not present

## 2021-07-27 DIAGNOSIS — I10 Essential (primary) hypertension: Secondary | ICD-10-CM | POA: Diagnosis not present

## 2021-07-27 DIAGNOSIS — E559 Vitamin D deficiency, unspecified: Secondary | ICD-10-CM | POA: Diagnosis not present

## 2021-07-27 DIAGNOSIS — K219 Gastro-esophageal reflux disease without esophagitis: Secondary | ICD-10-CM | POA: Diagnosis not present

## 2021-07-27 DIAGNOSIS — R69 Illness, unspecified: Secondary | ICD-10-CM | POA: Diagnosis not present

## 2021-07-27 DIAGNOSIS — I2510 Atherosclerotic heart disease of native coronary artery without angina pectoris: Secondary | ICD-10-CM | POA: Diagnosis not present

## 2021-07-27 MED FILL — Fosaprepitant Dimeglumine For IV Infusion 150 MG (Base Eq): INTRAVENOUS | Qty: 5 | Status: AC

## 2021-07-27 MED FILL — Dexamethasone Sodium Phosphate Inj 100 MG/10ML: INTRAMUSCULAR | Qty: 1 | Status: AC

## 2021-07-27 NOTE — Patient Instructions (Signed)
Thank you for visiting Dr. Valeta Harms at [facility]. Today we recommend the following:  Try mucinex to see if it helps with thinning your secretions  Return in about 6 months (around 01/24/2022) for with APP or Dr. Valeta Harms.    Please do your part to reduce the spread of COVID-19.

## 2021-07-27 NOTE — Progress Notes (Signed)
Synopsis: Referred in October 2022 for stage IV lung cancer by [name], MD  Subjective:   PATIENT ID: Allison [name] GENDER: female DOB: 10-15-1950, MRN: [id number]  Chief Complaint  Patient presents with   Follow-up    This is a 71 year old female, past medical history of stage IV adenocarcinoma of the lung.  Was initially diagnosed with a pleural effusion in July 2019.  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  Initially had a Pleurx catheter placed.  Had palliative radiotherapy and radiation as well as placement on Tagrisso.  Overall doing well for treatment.  Unfortunately CT imaging revealed progression of disease.  Has started chemotherapy with Dr. [name].  CT scan of the chest on 03/04/2021 was completed.  She had evidence of septal thickening nodularity extensive pleural thickening loculated right effusion concern for lymphangitic spread.  Patient was referred today to discuss bronchoscopy and biopsy.  Some of these lesions have a chance for additional mutations with therapy.  Therefore oncology is requesting a repeat biopsy.  OV 05/05/2021: Here today for follow-up after bronchoscopy.  Patient was taken for navigational bronchoscopy as well as transbronchial biopsies to the persistent infiltrates on the right lung.  Tissue from the mass on the left was negative for malignancy.  Patient also underwent transbronchial biopsies on the right.  This was also negative for malignancy.  Unfortunately, she developed a right-sided pneumothorax post endoscopy had a chest tube and a short hospitalization afterwards.  She is doing much better since her hospitalization.After bronchoscopy patient had follow-up on 04/07/2021 with medical oncology.  Office note reviewed.  Unfortunately felt to have progression of disease.  Discussed several options palliative versus treatment regimen versus arranging restaging CTs or referral for second opinion.  We took patient  on 04/21/2021 for repeat bronchoscopy. Follow-up appointment on 04/28/2021 with Dr. [name].  OV 07/27/2021: Here today for follow-up.Currently undergoing treatments with the cancer center as well as outpatient follow-up with palliative care.  From respiratory standpoint she is doing well.  She does have daily sputum production.  She does have a recent CT scan of the chest that was completed at [facility].  This shows stability of her extensive radiation changes and lesions within the chest.  The pleural fluid collection between the major fissure has resolved.  No other concerning findings of distant metastatic disease.  From her cough and sputum production standpoint she is using her nebulizer in the morning and at nighttime before bed.   Oncology History Overview Note  Patient presented with cough, congestion, SOB, fatigue, and wight loss.  Work up showed metastatic disease.    Adenocarcinoma of right lung, stage 4 ([place])  12/11/2017 Imaging   CT Chest IMPRESSION: 1. Large right pleural effusion with volume loss of much of the right lung. 2. Right upper lobe spiculated mass of 2.3 x 2.9 cm suspicious for primary lung carcinoma. 3. Several small nodules throughout the right lung and left lung suspicious for metastases. Some of the nodules on the right are intimately associated with pleura and could represent pleural metastases. 4. Questionable soft tissue nodule in the left upper abdomen which could represent a metastatic lesion. 5. Mixed lesion within the T9 vertebral body. Cannot exclude a metastatic process.   12/19/2017 Procedure   Thoracentesis   12/26/2017 Imaging   PET IMPRESSION: 1. Hypermetabolic spiculated 3.3 cm apical right upper lobe lung mass, compatible with primary bronchogenic adenocarcinoma. 2. Hypermetabolic right hilar and right  paratracheal lymphadenopathy. 3. Moderate dependent right pleural effusion with mild right pleural thickening, compatible with  known malignant right pleural effusion. 4. Hypermetabolic 1.6 cm anterior right lower lobe nodule associated with the major fissure, compatible with pulmonary metastasis. Numerous additional subcentimeter pulmonary nodules scattered in both lungs, below PET resolution, suspicious for pulmonary metastases. Recommend attention on follow-up chest CT in 3 months   01/04/2018 Initial Diagnosis   Adenocarcinoma of right lung, stage 4 (HCC)   01/10/2018 Imaging   MRI Brain  No evidence of intracranial metastases   01/29/2018 -  Chemotherapy   Oral biologic Tagrisso p.o. 80 mg    08/16/2020 Cancer Staging   Staging form: Lung, AJCC 8th Edition - Clinical: Stage IVA Laney Pastor, cN2, cM1a) - Signed by Curt Bears, MD on 08/16/2020    03/17/2021 -  Chemotherapy   Patient is on Treatment Plan : LUNG NSCLC Pemetrexed + Carboplatin q21d x 4 Cycles        Past Medical History:  Diagnosis Date   Anemia    Anxiety    Arthritis    Asthma    exercise induced   Depression    PMH   Dyspnea    GERD (gastroesophageal reflux disease)    Glaucoma    History of radiation therapy 01/05/2021   IMRT right lung  11/24/2020-01/05/2021  Dr [name]   Hypertension    lung ca dx'd 11/2017   right   Malignant pleural effusion    right   PONV (postoperative nausea and vomiting)    Pre-diabetes    Raynaud's disease    Raynaud's disease    Stroke ([place]) 01/2021   balance off, some express aphasia, weakness     Family History  Problem Relation Age of Onset   Heart disease Sister    Heart disease Brother    Lung cancer Other      Past Surgical History:  Procedure Laterality Date   ABDOMINAL HYSTERECTOMY     partial   BRONCHIAL BIOPSY  04/21/2021   Procedure: BRONCHIAL BIOPSIES;  Surgeon: [name], DO;  Location: [facility];  Service: Pulmonary;;   BRONCHIAL BRUSHINGS  04/21/2021   Procedure: BRONCHIAL BRUSHINGS;  Surgeon: [name], DO;  Location: [facility];  Service:  Pulmonary;;   BRONCHIAL NEEDLE ASPIRATION BIOPSY  04/21/2021   Procedure: BRONCHIAL NEEDLE ASPIRATION BIOPSIES;  Surgeon: [name], DO;  Location: [facility];  Service: Pulmonary;;   CHEST TUBE INSERTION Right 01/01/2018   Procedure: INSERTION PLEURAL DRAINAGE CATHETER;  Surgeon: Ivin Poot, MD;  Location: [facility];  Service: Thoracic;  Laterality: Right;   CHEST TUBE INSERTION  04/21/2021   Procedure: CHEST TUBE INSERTION;  Surgeon: [name], DO;  Location: [facility];  Service: Pulmonary;;   COLONOSCOPY     CORONARY STENT INTERVENTION N/A 06/16/2019   Procedure: CORONARY STENT INTERVENTION;  Surgeon: Jettie Booze, MD;  Location: [facility];  Service: Cardiovascular;  Laterality: N/A;   DILATION AND CURETTAGE OF UTERUS     EYE SURGERY     due to Glaucoma   IR IMAGING GUIDED PORT INSERTION  03/22/2021   IR PORT REPAIR CENTRAL VENOUS ACCESS DEVICE  04/15/2021   LEFT HEART CATH AND CORONARY ANGIOGRAPHY N/A 06/16/2019   Procedure: LEFT HEART CATH AND CORONARY ANGIOGRAPHY;  Surgeon: Jettie Booze, MD;  Location: [facility];  Service: Cardiovascular;  Laterality: N/A;   REMOVAL OF PLEURAL DRAINAGE CATHETER Right 11/07/2018   Procedure: REMOVAL OF PLEURAL  DRAINAGE CATHETER;  Surgeon: Prescott Gum, Collier Salina, MD;  Location: [facility];  Service: Thoracic;  Laterality: Right;   ROTATOR CUFF REPAIR     TUBAL LIGATION     VIDEO BRONCHOSCOPY WITH ENDOBRONCHIAL NAVIGATION Bilateral 04/21/2021   Procedure: VIDEO BRONCHOSCOPY WITH ENDOBRONCHIAL NAVIGATION;  Surgeon: [name], DO;  Location: [facility];  Service: Pulmonary;  Laterality: Bilateral;  ION   WISDOM TOOTH EXTRACTION      Social History   Socioeconomic History   Marital status: Widowed    Spouse name: Not on file   Number of children: 3   Years of education: Not on file   Highest education level: Not on file  Occupational History    Employer: [employer]  Tobacco Use   Smoking status: Never    Smokeless tobacco: Never  Vaping Use   Vaping Use: Never used  Substance and Sexual Activity   Alcohol use: Not Currently    Comment: up to 3 drinks per week   Drug use: No    Comment: CBD oil    Sexual activity: Not Currently    Comment: Hysterectomy  Other Topics Concern   Not on file  Social History Narrative   Not on file   Social Determinants of Health   Financial Resource Strain: Not on file  Food Insecurity: Not on file  Transportation Needs: Not on file  Physical Activity: Not on file  Stress: Not on file  Social Connections: Not on file  Intimate Partner Violence: Not on file     Allergies  Allergen Reactions   Penicillins Other (See Comments)    SYNCOPE PATIENT HAS HAD A PCN REACTION WITH IMMEDIATE RASH, FACIAL/TONGUE/THROAT SWELLING, SOB, OR LIGHTHEADEDNESS WITH HYPOTENSION:  #  #  YES  #  # Has patient had a PCN reaction causing severe rash involving mucus membranes or skin necrosis: No Has patient had a PCN reaction that required hospitalization: No Has patient had a PCN reaction occurring within the last 10 years: No    Vicodin [Hydrocodone-Acetaminophen] Other (See Comments)    Sped up heart and breathing   Other Other (See Comments)    Glaucoma eye drop - turned eyes dark     Outpatient Medications Prior to Visit  Medication Sig Dispense Refill   acetaminophen (TYLENOL) 500 MG tablet Take 1,000 mg by mouth every 6 (six) hours as needed (pain).     ALPRAZolam (XANAX) 0.25 MG tablet Take 1 tablet (0.25 mg total) by mouth 3 (three) times daily as needed for anxiety. May take 2 tablets (0.5 mg total) at bedtime. 60 tablet 0   amLODipine (NORVASC) 5 MG tablet Take 5 mg by mouth daily.     aspirin 81 MG chewable tablet Chew 1 tablet (81 mg total) by mouth daily.     brompheniramine-pseudoephedrine-DM 30-2-10 MG/5ML syrup Take 5 mLs by mouth 3 (three) times daily as needed. 120 mL 0   carvedilol (COREG) 12.5 MG tablet Take 1 tablet (12.5 mg total) by mouth  2 (two) times daily. 180 tablet 1   cetirizine (ZYRTEC) 5 MG tablet Take 5 mg by mouth daily.     chlorthalidone (HYGROTON) 25 MG tablet TAKE 1 TABLET (25 MG TOTAL) BY MOUTH DAILY. 90 tablet 1   dexamethasone (DECADRON) 4 MG tablet Take 1 tablet twice a day the day before, the day of, and the day after chemotherapy 40 tablet 2   dorzolamide-timolol (COSOPT) 22.3-6.8 MG/ML ophthalmic solution Place 1 drop into both eyes 2 (two)  times daily.     dronabinol (MARINOL) 2.5 MG capsule Take 1 capsule (2.5 mg total) by mouth 2 (two) times daily before a meal. 60 capsule 0   enoxaparin (LOVENOX) 60 MG/0.6ML injection Inject 0.6 mLs (60 mg total) into the skin every 12 (twelve) hours. 60 mL 1   esomeprazole (NEXIUM) 20 MG capsule Take 1 capsule (20 mg total) by mouth in the morning and at bedtime. 60 capsule 2   ezetimibe (ZETIA) 10 MG tablet Take 10 mg by mouth daily.      FLUoxetine (PROZAC) 10 MG capsule Take 1 capsule (10 mg total) by mouth daily. 30 capsule 3   folic acid (FOLVITE) 1 MG tablet TAKE 1 TABLET BY MOUTH EVERY DAY 90 tablet 1   hyoscyamine (LEVSIN SL) 0.125 MG SL tablet Place 2 tablets (0.25 mg total) under the tongue every 6 (six) hours as needed for cramping. Take 1-2 tablets under the tongue every 6 hours as needed for cramping 30 tablet 0   ipratropium-albuterol (DUONEB) 0.5-2.5 (3) MG/3ML SOLN Take 3 mLs by nebulization every 6 (six) hours as needed (Asthma).     isosorbide mononitrate (IMDUR) 30 MG 24 hr tablet Take 1 tablet (30 mg total) by mouth daily. 90 tablet 3   latanoprost (XALATAN) 0.005 % ophthalmic solution Place 1 drop into both eyes at bedtime.      lidocaine (LIDODERM) 5 % Place 1 patch onto the skin daily. Remove & Discard patch within 12 hours or as directed by MD 30 patch 0   magnesium oxide (MAG-OX) 400 (240 Mg) MG tablet Take 1 tablet (400 mg total) by mouth daily. 30 tablet 0   ondansetron (ZOFRAN-ODT) 4 MG disintegrating tablet Take 1 tablet (4 mg total) by mouth  every 8 (eight) hours as needed for nausea or vomiting. 30 tablet 1   osimertinib mesylate (TAGRISSO) 80 MG tablet Take 1 tablet (80 mg total) by mouth daily. 30 tablet 3   OVER THE COUNTER MEDICATION Take 4 drops by mouth daily. Viatmin d     oxyCODONE (OXY IR/ROXICODONE) 5 MG immediate release tablet Take 1 tablet (5 mg total) by mouth every 4 (four) hours as needed for moderate pain. 30 tablet 0   potassium chloride SA (KLOR-CON M) 20 MEQ tablet Take 1 tablet (20 mEq total) by mouth daily. 10 tablet 0   prochlorperazine (COMPAZINE) 10 MG tablet Take 1 tablet (10 mg total) by mouth every 6 (six) hours as needed for nausea or vomiting. 30 tablet 0   REPATHA SURECLICK 944 MG/ML SOAJ Inject 140 mg into the skin every 14 (fourteen) days. 6 mL 3   sucralfate (CARAFATE) 1 g tablet Take 1 tablet (1 g total) by mouth 4 (four) times daily. with meals and at bedtime. Crush and dissolve in 10 mL of warm water prior to swallowing 120 tablet 0   No facility-administered medications prior to visit.    Review of Systems  Constitutional:  Negative for chills, fever, malaise/fatigue and weight loss.  HENT:  Negative for hearing loss, sore throat and tinnitus.   Eyes:  Negative for blurred vision and double vision.  Respiratory:  Positive for cough, sputum production, shortness of breath and wheezing. Negative for hemoptysis and stridor.   Cardiovascular:  Negative for chest pain, palpitations, orthopnea, leg swelling and PND.  Gastrointestinal:  Negative for abdominal pain, constipation, diarrhea, heartburn, nausea and vomiting.  Genitourinary:  Negative for dysuria, hematuria and urgency.  Musculoskeletal:  Negative for joint pain and myalgias.  Skin:  Negative for itching and rash.  Neurological:  Negative for dizziness, tingling, weakness and headaches.  Endo/Heme/Allergies:  Negative for environmental allergies. Does not bruise/bleed easily.  Psychiatric/Behavioral:  Negative for depression. The patient  is not nervous/anxious and does not have insomnia.   All other systems reviewed and are negative.   Objective:  Physical Exam Vitals reviewed.  Constitutional:      General: She is not in acute distress.    Appearance: She is well-developed.  HENT:     Head: Normocephalic and atraumatic.  Eyes:     General: No scleral icterus.    Conjunctiva/sclera: Conjunctivae normal.     Pupils: Pupils are equal, round, and reactive to light.  Neck:     Vascular: No JVD.     Trachea: No tracheal deviation.  Cardiovascular:     Rate and Rhythm: Normal rate and regular rhythm.     Heart sounds: Normal heart sounds. No murmur heard. Pulmonary:     Effort: Pulmonary effort is normal. No tachypnea, accessory muscle usage or respiratory distress.     Breath sounds: No stridor. Rhonchi present. No wheezing or rales.  Abdominal:     General: Bowel sounds are normal. There is no distension.     Palpations: Abdomen is soft.     Tenderness: There is no abdominal tenderness.  Musculoskeletal:        General: No tenderness.     Cervical back: Neck supple.  Lymphadenopathy:     Cervical: No cervical adenopathy.  Skin:    General: Skin is warm and dry.     Capillary Refill: Capillary refill takes less than 2 seconds.     Findings: No rash.  Neurological:     Mental Status: She is alert and oriented to person, place, and time.  Psychiatric:        Behavior: Behavior normal.     Vitals:   07/27/21 1038  BP: (!) 112/52  Pulse: 72  Temp: 97.7 F (36.5 C)  TempSrc: Oral  SpO2: 99%  Weight: 114 lb 3.2 oz (51.8 kg)  Height: '5\' 4"'  (1.626 m)    99% on RA BMI Readings from Last 3 Encounters:  07/27/21 19.60 kg/m  07/20/21 19.91 kg/m  07/07/21 19.91 kg/m   Wt Readings from Last 3 Encounters:  07/27/21 114 lb 3.2 oz (51.8 kg)  07/20/21 116 lb (52.6 kg)  07/07/21 116 lb (52.6 kg)     CBC    Component Value Date/Time   WBC 4.9 07/20/2021 1055   WBC 2.0 (L) 04/26/2021 0333   RBC  3.79 (L) 07/20/2021 1055   HGB 10.7 (L) 07/20/2021 1055   HGB 11.6 07/03/2019 1153   HCT 33.8 (L) 07/20/2021 1055   HCT 36.0 07/03/2019 1153   PLT 69 (L) 07/20/2021 1055   PLT 291 07/03/2019 1153   MCV 89.2 07/20/2021 1055   MCV 81 07/03/2019 1153   MCH 28.2 07/20/2021 1055   MCHC 31.7 07/20/2021 1055   RDW 15.3 07/20/2021 1055   RDW 13.2 07/03/2019 1153   LYMPHSABS 0.6 (L) 07/20/2021 1055   MONOABS 1.0 07/20/2021 1055   EOSABS 0.0 07/20/2021 1055   BASOSABS 0.0 07/20/2021 1055     Chest Imaging: CT chest 04/27/2021: Evolving postradiation changes within the right upper lobe lesion, moderate right-sided partially loculated effusion scattered micro nodularity within the left chest. The patient's images have been independently reviewed by me.    Pulmonary Functions Testing Results: No flowsheet data found.  FeNO:  Pathology:   Echocardiogram:   Heart Catheterization:     Assessment & Plan:     ICD-10-CM   1. Adenocarcinoma of right lung, stage 4 (HCC)  C34.91     2. Subacute cough  R05.2     3. NSCLC with EGFR mutation ([place])  C34.90       Discussion:  This is a 71 year old female, stage IV adenocarcinoma of the lung, non-smoker.  She had evidence of giant cells and granulomas on transbronchial biopsies, no evidence of recurrent malignancy.  Repeat CT scan shows that this has improved.  She does have cough and sputum production.  She had a good consultation with second opinion at [facility].  Currently undergoing treatments with Dr. [name] here at the cancer center.  Plan: For her cough and sputum production I think this is related to her radiation-induced bronchiectasis and scarring of the lung.  I think is going to stay with her and we just need to do her best management  Continue albuterol as needed with a nebulizer We talked about trialing Mucinex to see if this helps thin her secretions.  Follow-up with [place] in 6 months or as needed    Current Outpatient  Medications:    acetaminophen (TYLENOL) 500 MG tablet, Take 1,000 mg by mouth every 6 (six) hours as needed (pain)., Disp: , Rfl:    ALPRAZolam (XANAX) 0.25 MG tablet, Take 1 tablet (0.25 mg total) by mouth 3 (three) times daily as needed for anxiety. May take 2 tablets (0.5 mg total) at bedtime., Disp: 60 tablet, Rfl: 0   amLODipine (NORVASC) 5 MG tablet, Take 5 mg by mouth daily., Disp: , Rfl:    aspirin 81 MG chewable tablet, Chew 1 tablet (81 mg total) by mouth daily., Disp: , Rfl:    brompheniramine-pseudoephedrine-DM 30-2-10 MG/5ML syrup, Take 5 mLs by mouth 3 (three) times daily as needed., Disp: 120 mL, Rfl: 0   carvedilol (COREG) 12.5 MG tablet, Take 1 tablet (12.5 mg total) by mouth 2 (two) times daily., Disp: 180 tablet, Rfl: 1   cetirizine (ZYRTEC) 5 MG tablet, Take 5 mg by mouth daily., Disp: , Rfl:    chlorthalidone (HYGROTON) 25 MG tablet, TAKE 1 TABLET (25 MG TOTAL) BY MOUTH DAILY., Disp: 90 tablet, Rfl: 1   dexamethasone (DECADRON) 4 MG tablet, Take 1 tablet twice a day the day before, the day of, and the day after chemotherapy, Disp: 40 tablet, Rfl: 2   dorzolamide-timolol (COSOPT) 22.3-6.8 MG/ML ophthalmic solution, Place 1 drop into both eyes 2 (two) times daily., Disp: , Rfl:    dronabinol (MARINOL) 2.5 MG capsule, Take 1 capsule (2.5 mg total) by mouth 2 (two) times daily before a meal., Disp: 60 capsule, Rfl: 0   enoxaparin (LOVENOX) 60 MG/0.6ML injection, Inject 0.6 mLs (60 mg total) into the skin every 12 (twelve) hours., Disp: 60 mL, Rfl: 1   esomeprazole (NEXIUM) 20 MG capsule, Take 1 capsule (20 mg total) by mouth in the morning and at bedtime., Disp: 60 capsule, Rfl: 2   ezetimibe (ZETIA) 10 MG tablet, Take 10 mg by mouth daily. , Disp: , Rfl:    FLUoxetine (PROZAC) 10 MG capsule, Take 1 capsule (10 mg total) by mouth daily., Disp: 30 capsule, Rfl: 3   folic acid (FOLVITE) 1 MG tablet, TAKE 1 TABLET BY MOUTH EVERY DAY, Disp: 90 tablet, Rfl: 1   hyoscyamine (LEVSIN SL)  0.125 MG SL tablet, Place 2 tablets (0.25 mg total) under the tongue every 6 (six)  hours as needed for cramping. Take 1-2 tablets under the tongue every 6 hours as needed for cramping, Disp: 30 tablet, Rfl: 0   ipratropium-albuterol (DUONEB) 0.5-2.5 (3) MG/3ML SOLN, Take 3 mLs by nebulization every 6 (six) hours as needed (Asthma)., Disp: , Rfl:    isosorbide mononitrate (IMDUR) 30 MG 24 hr tablet, Take 1 tablet (30 mg total) by mouth daily., Disp: 90 tablet, Rfl: 3   latanoprost (XALATAN) 0.005 % ophthalmic solution, Place 1 drop into both eyes at bedtime. , Disp: , Rfl:    lidocaine (LIDODERM) 5 %, Place 1 patch onto the skin daily. Remove & Discard patch within 12 hours or as directed by MD, Disp: 30 patch, Rfl: 0   magnesium oxide (MAG-OX) 400 (240 Mg) MG tablet, Take 1 tablet (400 mg total) by mouth daily., Disp: 30 tablet, Rfl: 0   ondansetron (ZOFRAN-ODT) 4 MG disintegrating tablet, Take 1 tablet (4 mg total) by mouth every 8 (eight) hours as needed for nausea or vomiting., Disp: 30 tablet, Rfl: 1   osimertinib mesylate (TAGRISSO) 80 MG tablet, Take 1 tablet (80 mg total) by mouth daily., Disp: 30 tablet, Rfl: 3   OVER THE COUNTER MEDICATION, Take 4 drops by mouth daily. Viatmin d, Disp: , Rfl:    oxyCODONE (OXY IR/ROXICODONE) 5 MG immediate release tablet, Take 1 tablet (5 mg total) by mouth every 4 (four) hours as needed for moderate pain., Disp: 30 tablet, Rfl: 0   potassium chloride SA (KLOR-CON M) 20 MEQ tablet, Take 1 tablet (20 mEq total) by mouth daily., Disp: 10 tablet, Rfl: 0   prochlorperazine (COMPAZINE) 10 MG tablet, Take 1 tablet (10 mg total) by mouth every 6 (six) hours as needed for nausea or vomiting., Disp: 30 tablet, Rfl: 0   REPATHA SURECLICK 532 MG/ML SOAJ, Inject 140 mg into the skin every 14 (fourteen) days., Disp: 6 mL, Rfl: 3   sucralfate (CARAFATE) 1 g tablet, Take 1 tablet (1 g total) by mouth 4 (four) times daily. with meals and at bedtime. Crush and dissolve in 10 mL  of warm water prior to swallowing, Disp: 120 tablet, Rfl: 0    [name], DO [facility] 07/27/2021 10:46 AM

## 2021-07-28 ENCOUNTER — Inpatient Hospital Stay: Payer: Medicare HMO

## 2021-07-28 ENCOUNTER — Inpatient Hospital Stay: Payer: Medicare HMO | Admitting: Physician Assistant

## 2021-07-28 VITALS — BP 145/86 | HR 58 | Temp 98.0°F | Resp 17 | Ht 64.0 in | Wt 115.7 lb

## 2021-07-28 DIAGNOSIS — Z79899 Other long term (current) drug therapy: Secondary | ICD-10-CM | POA: Diagnosis not present

## 2021-07-28 DIAGNOSIS — R1310 Dysphagia, unspecified: Secondary | ICD-10-CM

## 2021-07-28 DIAGNOSIS — Z923 Personal history of irradiation: Secondary | ICD-10-CM | POA: Diagnosis not present

## 2021-07-28 DIAGNOSIS — R69 Illness, unspecified: Secondary | ICD-10-CM | POA: Diagnosis not present

## 2021-07-28 DIAGNOSIS — R5383 Other fatigue: Secondary | ICD-10-CM | POA: Diagnosis not present

## 2021-07-28 DIAGNOSIS — Z5111 Encounter for antineoplastic chemotherapy: Secondary | ICD-10-CM

## 2021-07-28 DIAGNOSIS — C3491 Malignant neoplasm of unspecified part of right bronchus or lung: Secondary | ICD-10-CM

## 2021-07-28 DIAGNOSIS — D539 Nutritional anemia, unspecified: Secondary | ICD-10-CM

## 2021-07-28 DIAGNOSIS — Z79891 Long term (current) use of opiate analgesic: Secondary | ICD-10-CM | POA: Diagnosis not present

## 2021-07-28 DIAGNOSIS — E876 Hypokalemia: Secondary | ICD-10-CM

## 2021-07-28 DIAGNOSIS — D6481 Anemia due to antineoplastic chemotherapy: Secondary | ICD-10-CM

## 2021-07-28 DIAGNOSIS — R0602 Shortness of breath: Secondary | ICD-10-CM | POA: Diagnosis not present

## 2021-07-28 DIAGNOSIS — K30 Functional dyspepsia: Secondary | ICD-10-CM | POA: Diagnosis not present

## 2021-07-28 DIAGNOSIS — G893 Neoplasm related pain (acute) (chronic): Secondary | ICD-10-CM | POA: Diagnosis not present

## 2021-07-28 DIAGNOSIS — T451X5A Adverse effect of antineoplastic and immunosuppressive drugs, initial encounter: Secondary | ICD-10-CM

## 2021-07-28 DIAGNOSIS — C3411 Malignant neoplasm of upper lobe, right bronchus or lung: Secondary | ICD-10-CM | POA: Diagnosis not present

## 2021-07-28 LAB — CMP (CANCER CENTER ONLY)
ALT: 22 U/L (ref 0–44)
AST: 25 U/L (ref 15–41)
Albumin: 3.8 g/dL (ref 3.5–5.0)
Alkaline Phosphatase: 62 U/L (ref 38–126)
Anion gap: 6 (ref 5–15)
BUN: 13 mg/dL (ref 8–23)
CO2: 29 mmol/L (ref 22–32)
Calcium: 9.7 mg/dL (ref 8.9–10.3)
Chloride: 103 mmol/L (ref 98–111)
Creatinine: 1.07 mg/dL — ABNORMAL HIGH (ref 0.44–1.00)
GFR, Estimated: 56 mL/min — ABNORMAL LOW (ref >60)
Glucose, Bld: 127 mg/dL — ABNORMAL HIGH (ref 70–99)
Potassium: 3.1 mmol/L — ABNORMAL LOW (ref 3.5–5.1)
Sodium: 138 mmol/L (ref 135–145)
Total Bilirubin: 0.3 mg/dL (ref 0.3–1.2)
Total Protein: 6.9 g/dL (ref 6.5–8.1)

## 2021-07-28 LAB — CBC WITH DIFFERENTIAL (CANCER CENTER ONLY)
Abs Immature Granulocytes: 0.02 10*3/uL (ref 0.00–0.07)
Basophils Absolute: 0 10*3/uL (ref 0.0–0.1)
Basophils Relative: 0 %
Eosinophils Absolute: 0 10*3/uL (ref 0.0–0.5)
Eosinophils Relative: 0 %
HCT: 29.8 % — ABNORMAL LOW (ref 36.0–46.0)
Hemoglobin: 9.8 g/dL — ABNORMAL LOW (ref 12.0–15.0)
Immature Granulocytes: 0 %
Lymphocytes Relative: 10 %
Lymphs Abs: 0.5 10*3/uL — ABNORMAL LOW (ref 0.7–4.0)
MCH: 28.7 pg (ref 26.0–34.0)
MCHC: 32.9 g/dL (ref 30.0–36.0)
MCV: 87.4 fL (ref 80.0–100.0)
Monocytes Absolute: 0.4 10*3/uL (ref 0.1–1.0)
Monocytes Relative: 9 %
Neutro Abs: 4 10*3/uL (ref 1.7–7.7)
Neutrophils Relative %: 81 %
Platelet Count: 134 10*3/uL — ABNORMAL LOW (ref 150–400)
RBC: 3.41 MIL/uL — ABNORMAL LOW (ref 3.87–5.11)
RDW: 15.1 % (ref 11.5–15.5)
WBC Count: 5 10*3/uL (ref 4.0–10.5)
nRBC: 0 % (ref 0.0–0.2)

## 2021-07-28 LAB — SAMPLE TO BLOOD BANK

## 2021-07-28 MED ORDER — PALONOSETRON HCL INJECTION 0.25 MG/5ML: 0.2500 mg | Freq: Once | INTRAVENOUS | Status: DC

## 2021-07-28 MED ORDER — SODIUM CHLORIDE 0.9 % IV SOLN
10.0000 mg | Freq: Once | INTRAVENOUS | Status: DC
  Filled 2021-07-28: qty 1

## 2021-07-28 MED ORDER — HEPARIN SOD (PORK) LOCK FLUSH 100 UNIT/ML IV SOLN
500.0000 [IU] | Freq: Once | INTRAVENOUS | Status: AC | PRN
  Administered 2021-07-28: 500 [IU]

## 2021-07-28 MED ORDER — SODIUM CHLORIDE 0.9 % IV SOLN
150.0000 mg | Freq: Once | INTRAVENOUS | Status: DC
  Filled 2021-07-28: qty 5

## 2021-07-28 MED ORDER — PROCHLORPERAZINE MALEATE 10 MG PO TABS
10.0000 mg | ORAL_TABLET | Freq: Once | ORAL | Status: AC
  Administered 2021-07-28: 10 mg via ORAL
  Filled 2021-07-28: qty 1

## 2021-07-28 MED ORDER — SUCRALFATE 1 G PO TABS: 1.0000 g | ORAL_TABLET | Freq: Four times a day (QID) | ORAL | 0 refills | Status: DC

## 2021-07-28 MED ORDER — SODIUM CHLORIDE 0.9% FLUSH
10.0000 mL | INTRAVENOUS | Status: DC | PRN
  Administered 2021-07-28: 10 mL

## 2021-07-28 MED ORDER — SODIUM CHLORIDE 0.9 % IV SOLN: Freq: Once | INTRAVENOUS | Status: AC

## 2021-07-28 MED ORDER — SODIUM CHLORIDE 0.9 % IV SOLN
500.0000 mg/m2 | Freq: Once | INTRAVENOUS | Status: AC
  Administered 2021-07-28: 800 mg via INTRAVENOUS
  Filled 2021-07-28: qty 20

## 2021-07-28 MED ORDER — POTASSIUM CHLORIDE CRYS ER 20 MEQ PO TBCR: 20.0000 meq | EXTENDED_RELEASE_TABLET | Freq: Every day | ORAL | 0 refills | Status: DC

## 2021-07-28 NOTE — Patient Instructions (Signed)
[facility]  Discharge Instructions: Thank you for choosing [facility] to provide your oncology and hematology care.   If you have a lab appointment with the [facility], please go directly to the [facility] and check in at the registration area.   Wear comfortable clothing and clothing appropriate for easy access to any Portacath or PICC line.   We strive to give you quality time with your provider. You may need to reschedule your appointment if you arrive late (15 or more minutes).  Arriving late affects you and other patients whose appointments are after yours.  Also, if you miss three or more appointments without notifying the office, you may be dismissed from the clinic at the providers discretion.      For prescription refill requests, have your pharmacy contact our office and allow 72 hours for refills to be completed.    Today you received the following chemotherapy and/or immunotherapy agents: Pemetrexed (Alimta)    To help prevent nausea and vomiting after your treatment, we encourage you to take your nausea medication as directed.  BELOW ARE SYMPTOMS THAT SHOULD BE REPORTED IMMEDIATELY: *FEVER GREATER THAN 100.4 F (38 C) OR HIGHER *CHILLS OR SWEATING *NAUSEA AND VOMITING THAT IS NOT CONTROLLED WITH YOUR NAUSEA MEDICATION *UNUSUAL SHORTNESS OF BREATH *UNUSUAL BRUISING OR BLEEDING *URINARY PROBLEMS (pain or burning when urinating, or frequent urination) *BOWEL PROBLEMS (unusual diarrhea, constipation, pain near the anus) TENDERNESS IN MOUTH AND THROAT WITH OR WITHOUT PRESENCE OF ULCERS (sore throat, sores in mouth, or a toothache) UNUSUAL RASH, SWELLING OR PAIN  UNUSUAL VAGINAL DISCHARGE OR ITCHING   Items with * indicate a potential emergency and should be followed up as soon as possible or go to the Emergency Department if any problems should occur.  Please show the CHEMOTHERAPY ALERT CARD or IMMUNOTHERAPY ALERT CARD at  check-in to the Emergency Department and triage nurse.  Should you have questions after your visit or need to cancel or reschedule your appointment, please contact [facility]  Dept: [phone]  and follow the prompts.  Office hours are 8:00 a.m. to 4:30 p.m. Monday - Friday. Please note that voicemails left after 4:00 p.m. may not be returned until the following business day.  We are closed weekends and major holidays. You have access to a nurse at all times for urgent questions. Please call the main number to the clinic Dept: [phone] and follow the prompts.   For any non-urgent questions, you may also contact your provider using MyChart. We now offer e-Visits for anyone 71 and older to request care online for non-urgent symptoms. For details visit [email or web address].   Also download the MyChart app! Go to the app store, search "MyChart", open the app, select [facility], and log in with your MyChart username and password.  Due to Covid, a mask is required upon entering the hospital/clinic. If you do not have a mask, one will be given to you upon arrival. For doctor visits, patients may have 1 support person aged 71 or older with them. For treatment visits, patients cannot have anyone with them due to current Covid guidelines and our immunocompromised population.

## 2021-07-29 ENCOUNTER — Other Ambulatory Visit: Payer: Self-pay

## 2021-07-29 DIAGNOSIS — C3491 Malignant neoplasm of unspecified part of right bronchus or lung: Secondary | ICD-10-CM

## 2021-07-29 MED ORDER — OSIMERTINIB MESYLATE 80 MG PO TABS: 80.0000 mg | ORAL_TABLET | Freq: Every day | ORAL | 3 refills | Status: DC

## 2021-07-30 ENCOUNTER — Other Ambulatory Visit: Payer: Self-pay | Admitting: Physician Assistant

## 2021-07-30 DIAGNOSIS — E876 Hypokalemia: Secondary | ICD-10-CM

## 2021-08-01 ENCOUNTER — Encounter: Payer: Self-pay | Admitting: Physician Assistant

## 2021-08-01 ENCOUNTER — Encounter: Payer: Self-pay | Admitting: Internal Medicine

## 2021-08-02 ENCOUNTER — Telehealth: Payer: Self-pay

## 2021-08-02 DIAGNOSIS — I7300 Raynaud's syndrome without gangrene: Secondary | ICD-10-CM | POA: Diagnosis not present

## 2021-08-02 DIAGNOSIS — C3491 Malignant neoplasm of unspecified part of right bronchus or lung: Secondary | ICD-10-CM | POA: Diagnosis not present

## 2021-08-02 DIAGNOSIS — K219 Gastro-esophageal reflux disease without esophagitis: Secondary | ICD-10-CM | POA: Diagnosis not present

## 2021-08-02 DIAGNOSIS — N959 Unspecified menopausal and perimenopausal disorder: Secondary | ICD-10-CM | POA: Diagnosis not present

## 2021-08-02 DIAGNOSIS — E559 Vitamin D deficiency, unspecified: Secondary | ICD-10-CM | POA: Diagnosis not present

## 2021-08-02 DIAGNOSIS — H409 Unspecified glaucoma: Secondary | ICD-10-CM | POA: Diagnosis not present

## 2021-08-02 DIAGNOSIS — R69 Illness, unspecified: Secondary | ICD-10-CM | POA: Diagnosis not present

## 2021-08-02 DIAGNOSIS — I2510 Atherosclerotic heart disease of native coronary artery without angina pectoris: Secondary | ICD-10-CM | POA: Diagnosis not present

## 2021-08-02 DIAGNOSIS — G4700 Insomnia, unspecified: Secondary | ICD-10-CM | POA: Diagnosis not present

## 2021-08-02 DIAGNOSIS — I10 Essential (primary) hypertension: Secondary | ICD-10-CM | POA: Diagnosis not present

## 2021-08-02 NOTE — Telephone Encounter (Signed)
(  4:00 pm) Visit with patient rescheduled from 8:30 am to 10:00 am with palliative care nurse.

## 2021-08-03 ENCOUNTER — Other Ambulatory Visit: Payer: Medicare HMO

## 2021-08-03 ENCOUNTER — Other Ambulatory Visit: Payer: Self-pay

## 2021-08-03 ENCOUNTER — Encounter: Payer: Self-pay | Admitting: Internal Medicine

## 2021-08-03 ENCOUNTER — Encounter: Payer: Self-pay | Admitting: Physician Assistant

## 2021-08-03 ENCOUNTER — Other Ambulatory Visit: Payer: Medicare HMO | Admitting: *Deleted

## 2021-08-03 DIAGNOSIS — Z515 Encounter for palliative care: Secondary | ICD-10-CM

## 2021-08-04 ENCOUNTER — Telehealth: Payer: Self-pay | Admitting: *Deleted

## 2021-08-04 DIAGNOSIS — I2510 Atherosclerotic heart disease of native coronary artery without angina pectoris: Secondary | ICD-10-CM | POA: Diagnosis not present

## 2021-08-04 DIAGNOSIS — C3491 Malignant neoplasm of unspecified part of right bronchus or lung: Secondary | ICD-10-CM | POA: Diagnosis not present

## 2021-08-04 DIAGNOSIS — I7300 Raynaud's syndrome without gangrene: Secondary | ICD-10-CM | POA: Diagnosis not present

## 2021-08-04 DIAGNOSIS — R69 Illness, unspecified: Secondary | ICD-10-CM | POA: Diagnosis not present

## 2021-08-04 DIAGNOSIS — K219 Gastro-esophageal reflux disease without esophagitis: Secondary | ICD-10-CM | POA: Diagnosis not present

## 2021-08-04 DIAGNOSIS — G4700 Insomnia, unspecified: Secondary | ICD-10-CM | POA: Diagnosis not present

## 2021-08-04 DIAGNOSIS — I10 Essential (primary) hypertension: Secondary | ICD-10-CM | POA: Diagnosis not present

## 2021-08-04 DIAGNOSIS — E559 Vitamin D deficiency, unspecified: Secondary | ICD-10-CM | POA: Diagnosis not present

## 2021-08-04 DIAGNOSIS — N959 Unspecified menopausal and perimenopausal disorder: Secondary | ICD-10-CM | POA: Diagnosis not present

## 2021-08-04 DIAGNOSIS — H409 Unspecified glaucoma: Secondary | ICD-10-CM | POA: Diagnosis not present

## 2021-08-04 NOTE — Progress Notes (Signed)
[facility] COMMUNITY PALLIATIVE CARE RN NOTE  PATIENT NAME: Allison [name] DOB: 11-Oct-1950 MRN: [id number]  PRIMARY CARE PROVIDER: [name], MD  Acct ID - Guarantor Home Phone Work Phone Relationship Acct Type  [id number] - [place],PAU* [phone]  Self P/F     [address] [place], [place], [place] [address]   Covid-19 Pre-screening Negative  Joint follow-up palliative care visit made with LCSW, M. Lonon. Met with patient in her home. She reports that yesterday she felt "terrible." She was experiencing some chest tightness and having difficulty getting comfortable. She had a light physical therapy session yesterday and they taught her how to use pillows for comfort. She does take her nebulizer treatments on a daily basis routinely and at times she may take an additional treatment at night. She says it does help with her chest discomfort. Reinforced that she may take it up to 3x/day. She had an appointment with her pulmonologist last week. No changes made to her plan of care. She does have a productive cough and phlegm is light tan in appearance. She says she doesn't produce as much phlegm as she used to. She does have some dyspnea with exertion and can't talk as much. She continues with chemotherapy every 3 weeks. She had a scan last month which shows that the treatments are helping. She will continue these treatments for as long as they are helping her. She has had a little more nausea after this chemo session last week. She has compazine as needed. She also has bad stomach cramping at times. She has a poor appetite and wishes that she could eat more and gain weight. She can only take sips of Ensure due to being lactose intolerant and it bothers her stomach. She has Marinol prescribed twice daily, but is usually only able to take one daily as it makes her very sleepy/drowsy to the point that sometimes she can't hold her head up. She does take it twice daily about 2 days/week. She is  interested in something that will help her appetite without these side effects. Food does not taste good. Certain scents cause her nausea. She has to eat/drink warm things as cold items constricts her chest. She alternates occasionally between constipation and diarrhea. Sent update to Dr. Mariea Clonts to provide patient update and ask about another appetite stimulant. She recommends Prednisone 5 mg daily with food as long as she has a PPI ordered and she is currently taking Nexium. I will see if patient would like to try this. Palliative care will continue to follow.   (Duration of visit and documentation 75 minutes)   Daryl Eastern, RN BSN

## 2021-08-04 NOTE — Progress Notes (Signed)
COMMUNITY PALLIATIVE CARE SW NOTE  PATIENT NAME: Allison [name] DOB: 1951/03/24 MRN: [id number]  PRIMARY CARE PROVIDER: [name], MD  RESPONSIBLE PARTY:  Acct ID - Guarantor Home Phone Work Phone Relationship Acct Type  [id number] - [place],PAU* [phone]  Self P/F     [address], [place], [place] [address]   SOCIAL WORK ENCOUNTER  PC SW completed a joint visit with RN-M. Howard with patient a at her home. Patient  Patient reported that she has a "terrible" few days where feels tightness in her chest with increased shortness of breath. She is getting physical therapy and she does do breathing treatments (1x/day). She is also using pillows to help with positioning/breathing to help relieve the tightness as recommended by her therapist. Patient report that she does have productive cough that produces "tannish" color phlegm. Patient reported that she continues to receive chemotherapy for 3wks, each round. Her recent scan in January shows that the chemotherapy is helping. She report that her appetite is poor, despite the appetite stimulants. She describes her food  as "not tasting good" along with scents causing some nausea. Patient is requesting additional recommendations from the provider for an appetite stimulant. Patient report that warm liquids are better. Patient reported that overall she is sleeping more, having increased fatigue and weakness. Patient wants to be able to get to her mailbox and wanted an electric chair. SW advised that she could assist patient with locating a donated electric chair. Patient requested assistance.  Patient remains open to ongoing palliative care support and visits.   Duration of visit and documentation:75 minutes   Katheren Puller, LCSW

## 2021-08-04 NOTE — Telephone Encounter (Signed)
Called patient to schedule a follow up visit with Dr. Mariea Clonts. Follow up visit scheduled for 09/15/21 at 11:30a.

## 2021-08-08 DIAGNOSIS — G4700 Insomnia, unspecified: Secondary | ICD-10-CM | POA: Diagnosis not present

## 2021-08-08 DIAGNOSIS — I2510 Atherosclerotic heart disease of native coronary artery without angina pectoris: Secondary | ICD-10-CM | POA: Diagnosis not present

## 2021-08-08 DIAGNOSIS — I10 Essential (primary) hypertension: Secondary | ICD-10-CM | POA: Diagnosis not present

## 2021-08-08 DIAGNOSIS — E559 Vitamin D deficiency, unspecified: Secondary | ICD-10-CM | POA: Diagnosis not present

## 2021-08-08 DIAGNOSIS — H409 Unspecified glaucoma: Secondary | ICD-10-CM | POA: Diagnosis not present

## 2021-08-08 DIAGNOSIS — K219 Gastro-esophageal reflux disease without esophagitis: Secondary | ICD-10-CM | POA: Diagnosis not present

## 2021-08-08 DIAGNOSIS — C3491 Malignant neoplasm of unspecified part of right bronchus or lung: Secondary | ICD-10-CM | POA: Diagnosis not present

## 2021-08-08 DIAGNOSIS — N959 Unspecified menopausal and perimenopausal disorder: Secondary | ICD-10-CM | POA: Diagnosis not present

## 2021-08-08 DIAGNOSIS — R69 Illness, unspecified: Secondary | ICD-10-CM | POA: Diagnosis not present

## 2021-08-08 DIAGNOSIS — I7300 Raynaud's syndrome without gangrene: Secondary | ICD-10-CM | POA: Diagnosis not present

## 2021-08-10 ENCOUNTER — Other Ambulatory Visit: Payer: Self-pay

## 2021-08-11 ENCOUNTER — Telehealth: Payer: Self-pay | Admitting: *Deleted

## 2021-08-11 DIAGNOSIS — H409 Unspecified glaucoma: Secondary | ICD-10-CM | POA: Diagnosis not present

## 2021-08-11 DIAGNOSIS — N959 Unspecified menopausal and perimenopausal disorder: Secondary | ICD-10-CM | POA: Diagnosis not present

## 2021-08-11 DIAGNOSIS — E559 Vitamin D deficiency, unspecified: Secondary | ICD-10-CM | POA: Diagnosis not present

## 2021-08-11 DIAGNOSIS — C3491 Malignant neoplasm of unspecified part of right bronchus or lung: Secondary | ICD-10-CM | POA: Diagnosis not present

## 2021-08-11 DIAGNOSIS — K219 Gastro-esophageal reflux disease without esophagitis: Secondary | ICD-10-CM | POA: Diagnosis not present

## 2021-08-11 DIAGNOSIS — I2510 Atherosclerotic heart disease of native coronary artery without angina pectoris: Secondary | ICD-10-CM | POA: Diagnosis not present

## 2021-08-11 DIAGNOSIS — I10 Essential (primary) hypertension: Secondary | ICD-10-CM | POA: Diagnosis not present

## 2021-08-11 DIAGNOSIS — G4700 Insomnia, unspecified: Secondary | ICD-10-CM | POA: Diagnosis not present

## 2021-08-11 DIAGNOSIS — I7300 Raynaud's syndrome without gangrene: Secondary | ICD-10-CM | POA: Diagnosis not present

## 2021-08-11 DIAGNOSIS — R69 Illness, unspecified: Secondary | ICD-10-CM | POA: Diagnosis not present

## 2021-08-11 NOTE — Telephone Encounter (Signed)
Received a call from patient on 08/10/21 requesting letters from our physician in order to get approved to have her mail delivered to her door and for the [facility] to handle picking up her trash due to her current condition. Letters were typed and signed by our palliative care provider Dr. [name] and left at our front desk for patient to pick up tomorrow as she requested. I called and let patient know that the letters are there for her to pick up. She is appreciative.

## 2021-08-15 ENCOUNTER — Other Ambulatory Visit: Payer: Medicare HMO

## 2021-08-15 ENCOUNTER — Other Ambulatory Visit: Payer: Self-pay

## 2021-08-15 DIAGNOSIS — K219 Gastro-esophageal reflux disease without esophagitis: Secondary | ICD-10-CM | POA: Diagnosis not present

## 2021-08-15 DIAGNOSIS — I10 Essential (primary) hypertension: Secondary | ICD-10-CM | POA: Diagnosis not present

## 2021-08-15 DIAGNOSIS — I2510 Atherosclerotic heart disease of native coronary artery without angina pectoris: Secondary | ICD-10-CM | POA: Diagnosis not present

## 2021-08-15 DIAGNOSIS — G4700 Insomnia, unspecified: Secondary | ICD-10-CM | POA: Diagnosis not present

## 2021-08-15 DIAGNOSIS — N959 Unspecified menopausal and perimenopausal disorder: Secondary | ICD-10-CM | POA: Diagnosis not present

## 2021-08-15 DIAGNOSIS — H409 Unspecified glaucoma: Secondary | ICD-10-CM | POA: Diagnosis not present

## 2021-08-15 DIAGNOSIS — E559 Vitamin D deficiency, unspecified: Secondary | ICD-10-CM | POA: Diagnosis not present

## 2021-08-15 DIAGNOSIS — R69 Illness, unspecified: Secondary | ICD-10-CM | POA: Diagnosis not present

## 2021-08-15 DIAGNOSIS — I7300 Raynaud's syndrome without gangrene: Secondary | ICD-10-CM | POA: Diagnosis not present

## 2021-08-15 DIAGNOSIS — C3491 Malignant neoplasm of unspecified part of right bronchus or lung: Secondary | ICD-10-CM | POA: Diagnosis not present

## 2021-08-16 ENCOUNTER — Telehealth: Payer: Medicare HMO | Admitting: Physician Assistant

## 2021-08-16 ENCOUNTER — Encounter (INDEPENDENT_AMBULATORY_CARE_PROVIDER_SITE_OTHER): Payer: Self-pay

## 2021-08-17 ENCOUNTER — Ambulatory Visit: Payer: Medicare HMO | Admitting: Physician Assistant

## 2021-08-17 ENCOUNTER — Ambulatory Visit: Payer: Medicare HMO

## 2021-08-17 ENCOUNTER — Other Ambulatory Visit: Payer: Medicare HMO

## 2021-08-18 ENCOUNTER — Ambulatory Visit: Payer: Self-pay

## 2021-08-18 ENCOUNTER — Inpatient Hospital Stay: Payer: Medicare HMO | Admitting: Internal Medicine

## 2021-08-18 ENCOUNTER — Inpatient Hospital Stay: Payer: Medicare HMO | Attending: Internal Medicine

## 2021-08-18 ENCOUNTER — Encounter: Payer: Self-pay | Admitting: *Deleted

## 2021-08-18 ENCOUNTER — Encounter: Payer: Self-pay | Admitting: Nurse Practitioner

## 2021-08-18 ENCOUNTER — Other Ambulatory Visit: Payer: Self-pay

## 2021-08-18 ENCOUNTER — Inpatient Hospital Stay: Payer: Medicare HMO | Admitting: Nutrition

## 2021-08-18 ENCOUNTER — Inpatient Hospital Stay (HOSPITAL_BASED_OUTPATIENT_CLINIC_OR_DEPARTMENT_OTHER): Payer: Medicare HMO | Admitting: Nurse Practitioner

## 2021-08-18 ENCOUNTER — Other Ambulatory Visit: Payer: Self-pay | Admitting: Physician Assistant

## 2021-08-18 ENCOUNTER — Encounter: Payer: Self-pay | Admitting: Internal Medicine

## 2021-08-18 ENCOUNTER — Encounter: Payer: Self-pay | Admitting: General Practice

## 2021-08-18 ENCOUNTER — Inpatient Hospital Stay: Payer: Medicare HMO

## 2021-08-18 VITALS — BP 140/69 | HR 70 | Temp 97.0°F | Resp 17 | Ht 64.0 in | Wt 113.2 lb

## 2021-08-18 DIAGNOSIS — R630 Anorexia: Secondary | ICD-10-CM | POA: Diagnosis not present

## 2021-08-18 DIAGNOSIS — E876 Hypokalemia: Secondary | ICD-10-CM

## 2021-08-18 DIAGNOSIS — I7300 Raynaud's syndrome without gangrene: Secondary | ICD-10-CM | POA: Diagnosis not present

## 2021-08-18 DIAGNOSIS — F419 Anxiety disorder, unspecified: Secondary | ICD-10-CM | POA: Insufficient documentation

## 2021-08-18 DIAGNOSIS — R530 Neoplastic (malignant) related fatigue: Secondary | ICD-10-CM

## 2021-08-18 DIAGNOSIS — Z66 Do not resuscitate: Secondary | ICD-10-CM | POA: Insufficient documentation

## 2021-08-18 DIAGNOSIS — C3411 Malignant neoplasm of upper lobe, right bronchus or lung: Secondary | ICD-10-CM | POA: Insufficient documentation

## 2021-08-18 DIAGNOSIS — Z95828 Presence of other vascular implants and grafts: Secondary | ICD-10-CM

## 2021-08-18 DIAGNOSIS — Z7982 Long term (current) use of aspirin: Secondary | ICD-10-CM | POA: Diagnosis not present

## 2021-08-18 DIAGNOSIS — Z515 Encounter for palliative care: Secondary | ICD-10-CM | POA: Insufficient documentation

## 2021-08-18 DIAGNOSIS — Z5111 Encounter for antineoplastic chemotherapy: Secondary | ICD-10-CM | POA: Diagnosis not present

## 2021-08-18 DIAGNOSIS — R69 Illness, unspecified: Secondary | ICD-10-CM | POA: Diagnosis not present

## 2021-08-18 DIAGNOSIS — Z79899 Other long term (current) drug therapy: Secondary | ICD-10-CM | POA: Insufficient documentation

## 2021-08-18 DIAGNOSIS — J910 Malignant pleural effusion: Secondary | ICD-10-CM | POA: Diagnosis not present

## 2021-08-18 DIAGNOSIS — I2510 Atherosclerotic heart disease of native coronary artery without angina pectoris: Secondary | ICD-10-CM | POA: Insufficient documentation

## 2021-08-18 DIAGNOSIS — C3491 Malignant neoplasm of unspecified part of right bronchus or lung: Secondary | ICD-10-CM

## 2021-08-18 DIAGNOSIS — Z923 Personal history of irradiation: Secondary | ICD-10-CM | POA: Insufficient documentation

## 2021-08-18 DIAGNOSIS — F32A Depression, unspecified: Secondary | ICD-10-CM | POA: Insufficient documentation

## 2021-08-18 DIAGNOSIS — Z8673 Personal history of transient ischemic attack (TIA), and cerebral infarction without residual deficits: Secondary | ICD-10-CM | POA: Insufficient documentation

## 2021-08-18 DIAGNOSIS — K30 Functional dyspepsia: Secondary | ICD-10-CM | POA: Insufficient documentation

## 2021-08-18 DIAGNOSIS — C773 Secondary and unspecified malignant neoplasm of axilla and upper limb lymph nodes: Secondary | ICD-10-CM | POA: Diagnosis not present

## 2021-08-18 DIAGNOSIS — R1013 Epigastric pain: Secondary | ICD-10-CM | POA: Diagnosis not present

## 2021-08-18 DIAGNOSIS — K219 Gastro-esophageal reflux disease without esophagitis: Secondary | ICD-10-CM | POA: Diagnosis not present

## 2021-08-18 DIAGNOSIS — I10 Essential (primary) hypertension: Secondary | ICD-10-CM | POA: Diagnosis not present

## 2021-08-18 DIAGNOSIS — R5383 Other fatigue: Secondary | ICD-10-CM | POA: Insufficient documentation

## 2021-08-18 DIAGNOSIS — C3490 Malignant neoplasm of unspecified part of unspecified bronchus or lung: Secondary | ICD-10-CM | POA: Diagnosis not present

## 2021-08-18 LAB — CMP (CANCER CENTER ONLY)
ALT: 19 U/L (ref 0–44)
AST: 28 U/L (ref 15–41)
Albumin: 4 g/dL (ref 3.5–5.0)
Alkaline Phosphatase: 66 U/L (ref 38–126)
Anion gap: 8 (ref 5–15)
BUN: 25 mg/dL — ABNORMAL HIGH (ref 8–23)
CO2: 28 mmol/L (ref 22–32)
Calcium: 10 mg/dL (ref 8.9–10.3)
Chloride: 103 mmol/L (ref 98–111)
Creatinine: 1.23 mg/dL — ABNORMAL HIGH (ref 0.44–1.00)
GFR, Estimated: 47 mL/min — ABNORMAL LOW (ref >60)
Glucose, Bld: 181 mg/dL — ABNORMAL HIGH (ref 70–99)
Potassium: 3.1 mmol/L — ABNORMAL LOW (ref 3.5–5.1)
Sodium: 139 mmol/L (ref 135–145)
Total Bilirubin: 0.3 mg/dL (ref 0.3–1.2)
Total Protein: 7.3 g/dL (ref 6.5–8.1)

## 2021-08-18 LAB — CBC WITH DIFFERENTIAL (CANCER CENTER ONLY)
Abs Immature Granulocytes: 0.01 10*3/uL (ref 0.00–0.07)
Basophils Absolute: 0 10*3/uL (ref 0.0–0.1)
Basophils Relative: 0 %
Eosinophils Absolute: 0 10*3/uL (ref 0.0–0.5)
Eosinophils Relative: 0 %
HCT: 31.2 % — ABNORMAL LOW (ref 36.0–46.0)
Hemoglobin: 10 g/dL — ABNORMAL LOW (ref 12.0–15.0)
Immature Granulocytes: 0 %
Lymphocytes Relative: 10 %
Lymphs Abs: 0.4 10*3/uL — ABNORMAL LOW (ref 0.7–4.0)
MCH: 28.4 pg (ref 26.0–34.0)
MCHC: 32.1 g/dL (ref 30.0–36.0)
MCV: 88.6 fL (ref 80.0–100.0)
Monocytes Absolute: 0.1 10*3/uL (ref 0.1–1.0)
Monocytes Relative: 3 %
Neutro Abs: 3.5 10*3/uL (ref 1.7–7.7)
Neutrophils Relative %: 87 %
Platelet Count: 202 10*3/uL (ref 150–400)
RBC: 3.52 MIL/uL — ABNORMAL LOW (ref 3.87–5.11)
RDW: 15.4 % (ref 11.5–15.5)
WBC Count: 4.1 10*3/uL (ref 4.0–10.5)
nRBC: 0 % (ref 0.0–0.2)

## 2021-08-18 LAB — SAMPLE TO BLOOD BANK

## 2021-08-18 MED ORDER — SODIUM CHLORIDE 0.9% FLUSH
10.0000 mL | INTRAVENOUS | Status: DC | PRN
  Administered 2021-08-18: 10 mL

## 2021-08-18 MED ORDER — ONDANSETRON HCL 4 MG/2ML IJ SOLN
8.0000 mg | Freq: Once | INTRAMUSCULAR | Status: AC
  Administered 2021-08-18: 8 mg via INTRAVENOUS
  Filled 2021-08-18: qty 4

## 2021-08-18 MED ORDER — SODIUM CHLORIDE 0.9 % IV SOLN: 500.0000 mg/m2 | Freq: Once | INTRAVENOUS | Status: DC

## 2021-08-18 MED ORDER — PROCHLORPERAZINE MALEATE 10 MG PO TABS
10.0000 mg | ORAL_TABLET | Freq: Once | ORAL | Status: DC
  Filled 2021-08-18: qty 1

## 2021-08-18 MED ORDER — SODIUM CHLORIDE 0.9 % IV SOLN: Freq: Once | INTRAVENOUS | Status: AC

## 2021-08-18 MED ORDER — SODIUM CHLORIDE 0.9% FLUSH
10.0000 mL | Freq: Once | INTRAVENOUS | Status: AC
  Administered 2021-08-18: 10 mL

## 2021-08-18 MED ORDER — HEPARIN SOD (PORK) LOCK FLUSH 100 UNIT/ML IV SOLN
500.0000 [IU] | Freq: Once | INTRAVENOUS | Status: AC | PRN
  Administered 2021-08-18: 500 [IU]

## 2021-08-18 MED ORDER — SODIUM CHLORIDE 0.9 % IV SOLN
400.0000 mg/m2 | Freq: Once | INTRAVENOUS | Status: AC
  Administered 2021-08-18: 600 mg via INTRAVENOUS
  Filled 2021-08-18: qty 20

## 2021-08-18 MED ORDER — POTASSIUM CHLORIDE CRYS ER 20 MEQ PO TBCR: 20.0000 meq | EXTENDED_RELEASE_TABLET | Freq: Every day | ORAL | 0 refills | Status: DC

## 2021-08-18 NOTE — Progress Notes (Signed)
Oncology Nurse Navigator Documentation ? ?Oncology Nurse Navigator Flowsheets 08/18/2021 02/22/2018 01/04/2018 01/03/2018 01/03/2018 01/03/2018 12/28/2017  ?Abnormal Finding Date - 12/11/2017 - - - 12/11/2017 -  ?Confirmed Diagnosis Date - 12/19/2017 - - - 12/19/2017 -  ?Navigator Location [facility]  ?Navigator Encounter Type Clinic/MDC Other Other Clinic/MDC Telephone Other Telephone  ?Telephone - - - - - - Outgoing Call  ?Multidisiplinary Clinic Date - 01/03/2018 - - - 01/03/2018 -  ?Multidisiplinary Clinic Type - Thoracic - - - Thoracic -  ?Treatment Initiated Date - 01/29/2018 - - - - -  ?Patient Visit Type MedOnc;Follow-up - - MedOnc - - -  ?Treatment Phase Treatment/I was able to speak to Allison [name].  I absolutely enjoy her positive attitude and how she spreads positivity. I offered support and encouragement.  - Pre-Tx/Tx Discussion Pre-Tx/Tx Discussion Pre-Tx/Tx Discussion Pre-Tx/Tx Discussion Pre-Tx/Tx Discussion  ?Barriers/Navigation Needs - Coordination of Care Coordination of Care Education;Coordination of Care Education Coordination of Care Education;Coordination of Care  ?Education - - - Understanding Cancer/ Treatment Options;Newly Diagnosed Cancer Education Other - Other  ?Interventions Psycho-Social Support Coordination of Care Coordination of Care Coordination of Care;Education Education Coordination of Care Coordination of Care;Education  ?Acuity Level 2-Minimal Needs (1-2 Barriers Identified) Level 1 Level 1 Level 2 Level 1 Level 2 Level 2  ?Coordination of Care - Other Other Other - Other Appts  ?Education Method - - - Verbal;Written Verbal - Verbal  ?Time Spent with Patient 30 15 15  45 15 30 30   ?  ?

## 2021-08-18 NOTE — Patient Instructions (Signed)
[facility]  Discharge Instructions: ?Thank you for choosing [facility] to provide your oncology and hematology care.  ? ?If you have a lab appointment with the [facility], please go directly to the [facility] and check in at the registration area. ?  ?Wear comfortable clothing and clothing appropriate for easy access to any Portacath or PICC line.  ? ?We strive to give you quality time with your provider. You may need to reschedule your appointment if you arrive late (15 or more minutes).  Arriving late affects you and other patients whose appointments are after yours.  Also, if you miss three or more appointments without notifying the office, you may be dismissed from the clinic at the provider?s discretion.    ?  ?For prescription refill requests, have your pharmacy contact our office and allow 72 hours for refills to be completed.   ? ?Today you received the following chemotherapy and/or immunotherapy agents: Alimta ?  ?To help prevent nausea and vomiting after your treatment, we encourage you to take your nausea medication as directed. ? ?BELOW ARE SYMPTOMS THAT SHOULD BE REPORTED IMMEDIATELY: ?*FEVER GREATER THAN 100.4 F (38 ?C) OR HIGHER ?*CHILLS OR SWEATING ?*NAUSEA AND VOMITING THAT IS NOT CONTROLLED WITH YOUR NAUSEA MEDICATION ?*UNUSUAL SHORTNESS OF BREATH ?*UNUSUAL BRUISING OR BLEEDING ?*URINARY PROBLEMS (pain or burning when urinating, or frequent urination) ?*BOWEL PROBLEMS (unusual diarrhea, constipation, pain near the anus) ?TENDERNESS IN MOUTH AND THROAT WITH OR WITHOUT PRESENCE OF ULCERS (sore throat, sores in mouth, or a toothache) ?UNUSUAL RASH, SWELLING OR PAIN  ?UNUSUAL VAGINAL DISCHARGE OR ITCHING  ? ?Items with * indicate a potential emergency and should be followed up as soon as possible or go to the Emergency Department if any problems should occur. ? ?Please show the CHEMOTHERAPY ALERT CARD or IMMUNOTHERAPY ALERT CARD at check-in to the  Emergency Department and triage nurse. ? ?Should you have questions after your visit or need to cancel or reschedule your appointment, please contact [facility]  Dept: [phone]  and follow the prompts.  Office hours are 8:00 a.m. to 4:30 p.m. Monday - Friday. Please note that voicemails left after 4:00 p.m. may not be returned until the following business day.  We are closed weekends and major holidays. You have access to a nurse at all times for urgent questions. Please call the main number to the clinic Dept: [phone] and follow the prompts. ? ? ?For any non-urgent questions, you may also contact your provider using MyChart. We now offer e-Visits for anyone 6 and older to request care online for non-urgent symptoms. For details visit [email or web address]. ?  ?Also download the MyChart app! Go to the app store, search "MyChart", open the app, select [facility], and log in with your MyChart username and password. ? ?Due to Covid, a mask is required upon entering the hospital/clinic. If you do not have a mask, one will be given to you upon arrival. For doctor visits, patients may have 1 support person aged 50 or older with them. For treatment visits, patients cannot have anyone with them due to current Covid guidelines and our immunocompromised population.  ? ?

## 2021-08-18 NOTE — Progress Notes (Signed)
Nutrition follow-up completed with patient during infusion.  Patient is receiving treatment for stage IV non-small cell lung cancer. ? ?Weight decreased and documented as 113.2 pounds today.  This is decreased from 118.6 pounds December 8. ? ?Patient reports increase in stomach acid and states Nexium was increased to twice daily.  She was also told to eat [place] yogurt.   ?Patient continues to have a decreased appetite on Marinol.  Certain smells aggravate nausea. ?Most foods do not taste very good.  She has an aversion to sweet foods. ?States she tries to sip on 1 Ensure a day and has tried to heat hoping she would tolerate warm beverages better. ? ?Nutrition diagnosis: Unintended weight loss continues. ? ?Intervention: ?Educated patient on additional strategies for increasing oral intake. ?Continue 1 oral nutrition supplement daily as tolerated. ?Strive for small frequent snacks throughout the day keeping something in stomach at all times. ?Provided support and encouragement to increase oral intake. ? ?Monitoring, evaluation, goals: Patient will work to increase calories and protein to minimize further weight loss. ? ?Next visit: Thursday, April 13 during infusion. ? ?**Disclaimer: This note was dictated with voice recognition software. Similar sounding words can inadvertently be transcribed and this note may contain transcription errors which may not have been corrected upon publication of note.**' ?

## 2021-08-18 NOTE — Progress Notes (Signed)
[facility]  Telephone:([phone] Fax:(336) [phone]   Name: Allison [name] Date: 08/18/2021 MRN: [id number]  DOB: 20-Nov-1950  Patient Care Team: [name], MD as PCP - General (Internal Medicine) Donato Heinz, MD as PCP - Cardiology (Cardiology) [name], MD as Consulting Physician (Radiation Oncology) Pickenpack-Cousar, Carlena Sax, NP as Nurse Practitioner (Nurse Practitioner) Valrie Hart, RN as Oncology Nurse Navigator (Oncology)    INTERVAL HISTORY: Allison [name] is a 71 y.o. female with stage IV non-small cell lung cancer (12/2017), hypertension, CVA, CAD, DVT/PE (on Lovenox), and GERD.  Palliative ask to see for symptom management and goals of care.  SOCIAL HISTORY:     reports that she has never smoked. She has never used smokeless tobacco. She reports that she does not currently use alcohol. She reports that she does not use drugs.  ADVANCE DIRECTIVES:  None on file   CODE STATUS: DNR  PAST MEDICAL HISTORY: Past Medical History:  Diagnosis Date   Anemia    Anxiety    Arthritis    Asthma    exercise induced   Depression    PMH   Dyspnea    GERD (gastroesophageal reflux disease)    Glaucoma    History of radiation therapy 01/05/2021   IMRT right lung  11/24/2020-01/05/2021  Dr [name]   Hypertension    lung ca dx'd 11/2017   right   Malignant pleural effusion    right   PONV (postoperative nausea and vomiting)    Pre-diabetes    Raynaud's disease    Raynaud's disease    Stroke ([facility]) 01/2021   balance off, some express aphasia, weakness    ALLERGIES:  is allergic to penicillins, vicodin [hydrocodone-acetaminophen], and other.  MEDICATIONS:  Current Outpatient Medications  Medication Sig Dispense Refill   acetaminophen (TYLENOL) 500 MG tablet Take 1,000 mg by mouth every 6 (six) hours as needed (pain).     ALPRAZolam (XANAX) 0.25 MG tablet Take 1 tablet (0.25 mg total) by mouth 3  (three) times daily as needed for anxiety. May take 2 tablets (0.5 mg total) at bedtime. 60 tablet 0   amLODipine (NORVASC) 5 MG tablet Take 5 mg by mouth daily.     aspirin 81 MG chewable tablet Chew 1 tablet (81 mg total) by mouth daily.     brompheniramine-pseudoephedrine-DM 30-2-10 MG/5ML syrup Take 5 mLs by mouth 3 (three) times daily as needed. 120 mL 0   carvedilol (COREG) 12.5 MG tablet Take 1 tablet (12.5 mg total) by mouth 2 (two) times daily. 180 tablet 1   cetirizine (ZYRTEC) 5 MG tablet Take 5 mg by mouth daily.     chlorthalidone (HYGROTON) 25 MG tablet TAKE 1 TABLET (25 MG TOTAL) BY MOUTH DAILY. 90 tablet 1   dexamethasone (DECADRON) 4 MG tablet Take 1 tablet twice a day the day before, the day of, and the day after chemotherapy 40 tablet 2   dorzolamide-timolol (COSOPT) 22.3-6.8 MG/ML ophthalmic solution Place 1 drop into both eyes 2 (two) times daily.     dronabinol (MARINOL) 2.5 MG capsule Take 1 capsule (2.5 mg total) by mouth 2 (two) times daily before a meal. 60 capsule 0   enoxaparin (LOVENOX) 60 MG/0.6ML injection Inject 0.6 mLs (60 mg total) into the skin every 12 (twelve) hours. 60 mL 1   esomeprazole (NEXIUM) 20 MG capsule Take 1 capsule (20 mg total) by mouth in the morning and at bedtime. 60 capsule 2  ezetimibe (ZETIA) 10 MG tablet Take 10 mg by mouth daily.      FLUoxetine (PROZAC) 10 MG capsule Take 1 capsule (10 mg total) by mouth daily. 30 capsule 3   folic acid (FOLVITE) 1 MG tablet TAKE 1 TABLET BY MOUTH EVERY DAY 90 tablet 1   hyoscyamine (LEVSIN SL) 0.125 MG SL tablet Place 2 tablets (0.25 mg total) under the tongue every 6 (six) hours as needed for cramping. Take 1-2 tablets under the tongue every 6 hours as needed for cramping 30 tablet 0   ipratropium-albuterol (DUONEB) 0.5-2.5 (3) MG/3ML SOLN Take 3 mLs by nebulization every 6 (six) hours as needed (Asthma).     isosorbide mononitrate (IMDUR) 30 MG 24 hr tablet Take 1 tablet (30 mg total) by mouth daily. 90  tablet 3   latanoprost (XALATAN) 0.005 % ophthalmic solution Place 1 drop into both eyes at bedtime.      lidocaine (LIDODERM) 5 % Place 1 patch onto the skin daily. Remove & Discard patch within 12 hours or as directed by MD 30 patch 0   magnesium oxide (MAG-OX) 400 (240 Mg) MG tablet TAKE 1 TABLET BY MOUTH EVERY DAY 30 tablet 0   ondansetron (ZOFRAN-ODT) 4 MG disintegrating tablet Take 1 tablet (4 mg total) by mouth every 8 (eight) hours as needed for nausea or vomiting. 30 tablet 1   osimertinib mesylate (TAGRISSO) 80 MG tablet Take 1 tablet (80 mg total) by mouth daily. 30 tablet 3   OVER THE COUNTER MEDICATION Take 4 drops by mouth daily. Viatmin d     oxyCODONE (OXY IR/ROXICODONE) 5 MG immediate release tablet Take 1 tablet (5 mg total) by mouth every 4 (four) hours as needed for moderate pain. 30 tablet 0   potassium chloride SA (KLOR-CON M) 20 MEQ tablet Take 1 tablet (20 mEq total) by mouth daily. 7 tablet 0   prochlorperazine (COMPAZINE) 10 MG tablet Take 1 tablet (10 mg total) by mouth every 6 (six) hours as needed for nausea or vomiting. 30 tablet 0   REPATHA SURECLICK 798 MG/ML SOAJ Inject 140 mg into the skin every 14 (fourteen) days. 6 mL 3   sucralfate (CARAFATE) 1 g tablet Take 1 tablet (1 g total) by mouth 4 (four) times daily. with meals and at bedtime. Crush and dissolve in 10 mL of warm water prior to swallowing 120 tablet 0   No current facility-administered medications for this visit.    VITAL SIGNS: LMP  (LMP Unknown)  There were no vitals filed for this visit.  Estimated body mass index is 19.43 kg/m as calculated from the following:   Height as of an earlier encounter on 08/18/21: 5\' 4"  (1.626 m).   Weight as of an earlier encounter on 08/18/21: 113 lb 3.2 oz (51.3 kg).   PERFORMANCE STATUS (ECOG) : 1 - Symptomatic but completely ambulatory   Physical Exam General: NAD Cardiovascular: RRR Pulmonary: clear ant fields Abdomen: soft, nontender, + bowel  sounds Extremities: no edema, no joint deformities Neurological: AAO x3, mood appropriate   IMPRESSION:  Allison [name] is here today for follow-up. She is doing well. No acute distress noted. She is appreciative of how well she is feeling. Is able to be more active around the home.   Fatigue  Improved. Seems to be more situational. Increased fatigue more noticeable in conjunction with her activity levels. Encouraged her to listen to her body and take frequent rest breaks as needed.   Anxiety Improved. Kiani shares her anxiety  is much more controlled with the Xanax. Is taking as needed. Does not have to take as often as before.    Appetite Appetite continues to improve. She does endorse significant indigestion. Currently taking nexium daily. Advised she may take twice daily. Hopefully this will provide better relief. Education provided on continued increase in protein enriched foods. She is drinking Ensure daily. Discussed consuming greek yogurt, fruits, vegetables, and nuts which she is doing. Also had conversations of how to be creative with protein drinks to provide more versatile options while also aiding in decrease in reflux.   I discussed the importance of continued conversation with family and their medical providers regarding overall plan of care and treatment options, ensuring decisions are within the context of the patients values and GOCs.  PLAN: Symptoms are much better managed. Appetite is improving. She is being followed by the dietician.  Nexium twice daily for indigestion. She is not interested in Protonix. Education provided on dietary choices.  Continue xanax for anxiety as needed.  I will plan to see back in 3-4 weeks.    Patient expressed understanding and was in agreement with this plan. She also understands that She can call the clinic at any time with any questions, concerns, or complaints.   Time Total: 35 min.   Visit consisted of counseling and education dealing  with the complex and emotionally intense issues of symptom management and palliative care in the setting of serious and potentially life-threatening illness.Greater than 50%  of this time was spent counseling and coordinating care related to the above assessment and plan.  Alda Lea, AGPCNP-BC  Palliative Medicine Team/[facility]

## 2021-08-18 NOTE — Progress Notes (Signed)
Pt refused compazine PO today d/t it causing her stomach upset.  ?RN made MD aware. ?MD ordered Zofran 8mg  IV in place of compazine. ?

## 2021-08-18 NOTE — Progress Notes (Signed)
[facility] Spiritual Care Note ? ?Follow [name] through [facility] Cancer Support [facility]. Visited in infusion today to provide care and encouragement, including empathic listening, emotional support, and prayer per request. [name] notes that steroids are helping her feel more energetic, while (along with a busy day at [facility]) also enabling her to get more tired than usual because of her energy expenditure. She remains buoyant/upbeat and uses gratitude and perspective to cope. We plan to continue to follow up at her treatments, and she knows how to reach chaplain in the interim as needed/desired, as well. ? ? ?Chaplain Lorrin Jackson, MDiv, [facility] ?Pager [phone] ?Voicemail [phone]  ?

## 2021-08-18 NOTE — Progress Notes (Signed)
[facility] [phone]) [phone]   [phone]  OFFICE PROGRESS NOTE  [name], [place] [address]  DIAGNOSIS: Stage IV (T2 a,N2, M1a) non-small cell lung cancer, adenocarcinoma diagnosed in July 2019 and presented with right upper lobe lung mass in addition to mediastinal lymphadenopathy as well as bilateral pulmonary nodules and malignant right pleural effusion.   Biomarker Findings Microsatellite status - Cannot Be Determined Tumor Mutational Burden - Cannot Be Determined Genomic Findings For a complete list of the genes assayed, please refer to the Appendix. EGFR exon 19 deletion ([id number]) TP53 Y220C 7 Disease relevant genes with no reportable alterations: KRAS, ALK, BRAF, MET, RET, ERBB2, ROS1    PRIOR THERAPY:  1) Status post right Pleurx catheter placement by Dr. Prescott Gum for drainage of malignant right pleural effusion. 2) palliative radiotherapy to the enlarging right upper lobe lung mass and mediastinum under the care of Dr. Sondra Come expected to be completed on January 05, 2021. 3) Tagrisso 80 mg p.o. daily.  First dose was given on 01/29/2018.  Status post 39 months of treatment.   CURRENT THERAPY: Systemic chemotherapy with carboplatin for AUC of 5 and Alimta 500 Mg/M2 every 3 weeks.  First dose 03/17/2021.  The patient will also continue her current treatment with Tagrisso 80 mg p.o. daily.  She is status post 7 cycles. Starting from cycle #7, she will be on Alimta only 500 mg/m2.  INTERVAL HISTORY: Allison [name] 71 y.o. female returns to the clinic today for follow-up visit accompanied by her boyfriend.  The patient is feeling fine today with no concerning complaints except for fatigue and mild cough.  She has no current chest pain, shortness of breath or hemoptysis.  She has occasional nausea with no vomiting, diarrhea or constipation..  She has occasional blood-tinged nasal secretions.  She has no  recent weight loss or night sweats.  She continues to tolerate her maintenance treatment with Alimta fairly well.  She is here today for evaluation before starting cycle #8.  MEDICAL HISTORY: Past Medical History:  Diagnosis Date   Anemia    Anxiety    Arthritis    Asthma    exercise induced   Depression    PMH   Dyspnea    GERD (gastroesophageal reflux disease)    Glaucoma    History of radiation therapy 01/05/2021   IMRT right lung  11/24/2020-01/05/2021  Dr [name]   Hypertension    lung ca dx'd 11/2017   right   Malignant pleural effusion    right   PONV (postoperative nausea and vomiting)    Pre-diabetes    Raynaud's disease    Raynaud's disease    Stroke ([facility]) 01/2021   balance off, some express aphasia, weakness    ALLERGIES:  is allergic to penicillins, vicodin [hydrocodone-acetaminophen], and other.  MEDICATIONS:  Current Outpatient Medications  Medication Sig Dispense Refill   acetaminophen (TYLENOL) 500 MG tablet Take 1,000 mg by mouth every 6 (six) hours as needed (pain).     ALPRAZolam (XANAX) 0.25 MG tablet Take 1 tablet (0.25 mg total) by mouth 3 (three) times daily as needed for anxiety. May take 2 tablets (0.5 mg total) at bedtime. 60 tablet 0   amLODipine (NORVASC) 5 MG tablet Take 5 mg by mouth daily.     aspirin 81 MG chewable tablet Chew 1 tablet (81 mg total) by mouth daily.     brompheniramine-pseudoephedrine-DM 30-2-10 MG/5ML syrup Take  5 mLs by mouth 3 (three) times daily as needed. 120 mL 0   carvedilol (COREG) 12.5 MG tablet Take 1 tablet (12.5 mg total) by mouth 2 (two) times daily. 180 tablet 1   cetirizine (ZYRTEC) 5 MG tablet Take 5 mg by mouth daily.     chlorthalidone (HYGROTON) 25 MG tablet TAKE 1 TABLET (25 MG TOTAL) BY MOUTH DAILY. 90 tablet 1   dexamethasone (DECADRON) 4 MG tablet Take 1 tablet twice a day the day before, the day of, and the day after chemotherapy 40 tablet 2   dorzolamide-timolol (COSOPT) 22.3-6.8 MG/ML ophthalmic  solution Place 1 drop into both eyes 2 (two) times daily.     dronabinol (MARINOL) 2.5 MG capsule Take 1 capsule (2.5 mg total) by mouth 2 (two) times daily before a meal. 60 capsule 0   enoxaparin (LOVENOX) 60 MG/0.6ML injection Inject 0.6 mLs (60 mg total) into the skin every 12 (twelve) hours. 60 mL 1   esomeprazole (NEXIUM) 20 MG capsule Take 1 capsule (20 mg total) by mouth in the morning and at bedtime. 60 capsule 2   ezetimibe (ZETIA) 10 MG tablet Take 10 mg by mouth daily.      FLUoxetine (PROZAC) 10 MG capsule Take 1 capsule (10 mg total) by mouth daily. 30 capsule 3   folic acid (FOLVITE) 1 MG tablet TAKE 1 TABLET BY MOUTH EVERY DAY 90 tablet 1   hyoscyamine (LEVSIN SL) 0.125 MG SL tablet Place 2 tablets (0.25 mg total) under the tongue every 6 (six) hours as needed for cramping. Take 1-2 tablets under the tongue every 6 hours as needed for cramping 30 tablet 0   ipratropium-albuterol (DUONEB) 0.5-2.5 (3) MG/3ML SOLN Take 3 mLs by nebulization every 6 (six) hours as needed (Asthma).     isosorbide mononitrate (IMDUR) 30 MG 24 hr tablet Take 1 tablet (30 mg total) by mouth daily. 90 tablet 3   latanoprost (XALATAN) 0.005 % ophthalmic solution Place 1 drop into both eyes at bedtime.      lidocaine (LIDODERM) 5 % Place 1 patch onto the skin daily. Remove & Discard patch within 12 hours or as directed by MD 30 patch 0   magnesium oxide (MAG-OX) 400 (240 Mg) MG tablet TAKE 1 TABLET BY MOUTH EVERY DAY 30 tablet 0   ondansetron (ZOFRAN-ODT) 4 MG disintegrating tablet Take 1 tablet (4 mg total) by mouth every 8 (eight) hours as needed for nausea or vomiting. 30 tablet 1   osimertinib mesylate (TAGRISSO) 80 MG tablet Take 1 tablet (80 mg total) by mouth daily. 30 tablet 3   OVER THE COUNTER MEDICATION Take 4 drops by mouth daily. Viatmin d     oxyCODONE (OXY IR/ROXICODONE) 5 MG immediate release tablet Take 1 tablet (5 mg total) by mouth every 4 (four) hours as needed for moderate pain. 30 tablet 0    potassium chloride SA (KLOR-CON M) 20 MEQ tablet Take 1 tablet (20 mEq total) by mouth daily. 7 tablet 0   prochlorperazine (COMPAZINE) 10 MG tablet Take 1 tablet (10 mg total) by mouth every 6 (six) hours as needed for nausea or vomiting. 30 tablet 0   REPATHA SURECLICK 762 MG/ML SOAJ Inject 140 mg into the skin every 14 (fourteen) days. 6 mL 3   sucralfate (CARAFATE) 1 g tablet Take 1 tablet (1 g total) by mouth 4 (four) times daily. with meals and at bedtime. Crush and dissolve in 10 mL of warm water prior to swallowing 120 tablet 0  No current facility-administered medications for this visit.    SURGICAL HISTORY:  Past Surgical History:  Procedure Laterality Date   ABDOMINAL HYSTERECTOMY     partial   BRONCHIAL BIOPSY  04/21/2021   Procedure: BRONCHIAL BIOPSIES;  Surgeon: [name], DO;  Location: [facility];  Service: Pulmonary;;   BRONCHIAL BRUSHINGS  04/21/2021   Procedure: BRONCHIAL BRUSHINGS;  Surgeon: [name], DO;  Location: [facility];  Service: Pulmonary;;   BRONCHIAL NEEDLE ASPIRATION BIOPSY  04/21/2021   Procedure: BRONCHIAL NEEDLE ASPIRATION BIOPSIES;  Surgeon: [name], DO;  Location: [facility];  Service: Pulmonary;;   CHEST TUBE INSERTION Right 01/01/2018   Procedure: INSERTION PLEURAL DRAINAGE CATHETER;  Surgeon: Ivin Poot, MD;  [facility]: [facility] OR;  Service: Thoracic;  Laterality: Right;   CHEST TUBE INSERTION  04/21/2021   Procedure: CHEST TUBE INSERTION;  Surgeon: [name], DO;  Location: [facility];  Service: Pulmonary;;   COLONOSCOPY     CORONARY STENT INTERVENTION N/A 06/16/2019   Procedure: CORONARY STENT INTERVENTION;  Surgeon: Jettie Booze, MD;  Location: [facility];  Service: Cardiovascular;  Laterality: N/A;   DILATION AND CURETTAGE OF UTERUS     EYE SURGERY     due to Glaucoma   IR IMAGING GUIDED PORT INSERTION  03/22/2021   IR PORT REPAIR CENTRAL VENOUS ACCESS DEVICE  04/15/2021   LEFT HEART CATH  AND CORONARY ANGIOGRAPHY N/A 06/16/2019   Procedure: LEFT HEART CATH AND CORONARY ANGIOGRAPHY;  Surgeon: Jettie Booze, MD;  Location: [facility];  Service: Cardiovascular;  Laterality: N/A;   REMOVAL OF PLEURAL DRAINAGE CATHETER Right 11/07/2018   Procedure: REMOVAL OF PLEURAL DRAINAGE CATHETER;  Surgeon: Ivin Poot, MD;  Location: [facility];  Service: Thoracic;  Laterality: Right;   ROTATOR CUFF REPAIR     TUBAL LIGATION     VIDEO BRONCHOSCOPY WITH ENDOBRONCHIAL NAVIGATION Bilateral 04/21/2021   Procedure: VIDEO BRONCHOSCOPY WITH ENDOBRONCHIAL NAVIGATION;  Surgeon: [name], DO;  Location: [facility];  Service: Pulmonary;  Laterality: Bilateral;  ION   WISDOM TOOTH EXTRACTION      REVIEW OF SYSTEMS:  A comprehensive review of systems was negative except for: Constitutional: positive for fatigue Respiratory: positive for dyspnea on exertion   PHYSICAL EXAMINATION: General appearance: alert, cooperative, and appears stated age Head: Normocephalic, without obvious abnormality, atraumatic Neck: no adenopathy, no JVD, supple, symmetrical, trachea midline, and thyroid not enlarged, symmetric, no tenderness/mass/nodules Lymph nodes: Cervical, supraclavicular, and axillary nodes normal. Resp: clear to auscultation bilaterally Back: symmetric, no curvature. ROM normal. No CVA tenderness. Cardio: regular rate and rhythm, S1, S2 normal, no murmur, click, rub or gallop GI: soft, non-tender; bowel sounds normal; no masses,  no organomegaly Extremities: extremities normal, atraumatic, no cyanosis or edema  ECOG PERFORMANCE STATUS: 1 - Symptomatic but completely ambulatory  Blood pressure 140/69, pulse 70, temperature (!) 97 F (36.1 C), temperature source Tympanic, resp. rate 17, height '5\' 4"'  (1.626 m), weight 113 lb 3.2 oz (51.3 kg), SpO2 100 %.  LABORATORY DATA: Lab Results  Component Value Date   WBC 4.1 08/18/2021   HGB 10.0 (L) 08/18/2021   HCT 31.2 (L) 08/18/2021    MCV 88.6 08/18/2021   PLT 202 08/18/2021      Chemistry      Component Value Date/Time   NA 139 08/18/2021 1039   NA 140 03/04/2021 1516   K 3.1 (L) 08/18/2021 1039   CL 103 08/18/2021 1039   CO2  28 08/18/2021 1039   BUN 25 (H) 08/18/2021 1039   BUN 21 03/04/2021 1516   CREATININE 1.23 (H) 08/18/2021 1039      Component Value Date/Time   CALCIUM 10.0 08/18/2021 1039   ALKPHOS 66 08/18/2021 1039   AST 28 08/18/2021 1039   ALT 19 08/18/2021 1039   BILITOT 0.3 08/18/2021 1039       RADIOGRAPHIC STUDIES: LONG TERM MONITOR (3-14 DAYS)  Result Date: 07/31/2021  12 episodes of SVT, longest lasting 18 beats  No significant arrhythmias  Patch Wear Time:  14 days and 0 hours (2023-01-06T16:38:54-0500 to 2023-01-20T16:38:58-0500) Patient had a min HR of 54 bpm, max HR of 160 bpm, and avg HR of 78 bpm. Predominant underlying rhythm was Sinus Rhythm. 12 Supraventricular Tachycardia runs occurred, the run with the fastest interval lasting 12 beats with a max rate of 160 bpm, the longest lasting 18 beats with an avg rate of 123 bpm. Some episodes of Supraventricular Tachycardia may be possible Atrial Tachycardia with variable block. Isolated SVEs were rare (<1.0%), SVE Couplets were rare (<1.0%), and SVE Triplets were rare (<1.0%). Isolated VEs were rare (<1.0%, 6169), VE Couplets were rare (<1.0%, 88), and VE Triplets were rare (<1.0%, 1). Ventricular Bigeminy and Trigeminy were present.  No patient triggered events    ASSESSMENT AND PLAN: This is a very pleasant 71 years old never smoker African-American female recently with a stage IV non-small cell lung cancer, adenocarcinoma with positive EGFR mutation with deletion in exon 19. The patient was started on treatment with Tagrisso 80 mg p.o. daily status post 35 months of treatment. She has been tolerating this treatment well with no concerning adverse effects except for intermittent diarrhea. She had repeat CT scan of the chest, abdomen  pelvis performed recently.  I personally and independently reviewed the scans and discussed the results with the patient and her boyfriend today. Unfortunately the CT scan showed interval progression of the right apical lung mass in addition to progression of mediastinal lymphadenopathy concerning for worsening of her disease. She has no actionable resistant mutation on the molecular studies performed by [facility]. The patient continued her current treatment with Tagrisso and tolerating it fairly well. 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 Sondra Come completed January 05, 2021. The patient had significant opacities in her lung that was initially thought to be secondary to radiation treatment versus Tagrisso induced pneumonitis versus lymphangitic spread of the tumor.  She was treated with high-dose taper regiment of prednisone Repeat imaging studies after the palliative radiotherapy showed evidence for disease progression. Her molecular studies by [facility] recently showed no new resistant mutation.  After discussion of her treatment options including palliative care and hospice referral versus palliative systemic chemotherapy the patient was interested in proceeding palliative systemic chemotherapy.  She started  palliative systemic chemotherapy with carboplatin for AUC of 5 and Alimta 500 Mg/M2 every 3 weeks.  Status post 7 cycles.  Starting from cycle #7 the patient is on treatment with single agent Alimta every 3 weeks. I did not add a Avastin to her treatment because of the recent stroke.  She also continued her treatment with Tagrisso at the same time.  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 I recommended for her to proceed with cycle #8 today as planned. I will see her back for follow-up visit in  3 weeks  for evaluation with repeat CT scan of the chest, abdomen and pelvis for restaging of her disease. The patient was advised to call immediately if she has any other concerning symptoms in the interval. The patient voices understanding of current disease status and treatment options and is in agreement with the current care plan.  All questions were answered. The patient knows to call the clinic with any problems, questions or concerns. We can certainly see the patient much sooner if necessary.  he total time spent in the appointment was 20 minutes.  Disclaimer: This note was dictated with voice recognition software. Similar sounding words can inadvertently be transcribed and may not be corrected upon review.

## 2021-08-18 NOTE — Progress Notes (Signed)
Patient with CrCl 34.4 ml/min today, received orders to decrease Alimta to 400 mg/m2 (600mg ). ? ?T.O. Dr Mohamed/Riad Wagley Ronnald Ramp, PharmD ?

## 2021-08-19 DIAGNOSIS — H409 Unspecified glaucoma: Secondary | ICD-10-CM | POA: Diagnosis not present

## 2021-08-19 DIAGNOSIS — N959 Unspecified menopausal and perimenopausal disorder: Secondary | ICD-10-CM | POA: Diagnosis not present

## 2021-08-19 DIAGNOSIS — R69 Illness, unspecified: Secondary | ICD-10-CM | POA: Diagnosis not present

## 2021-08-19 DIAGNOSIS — K219 Gastro-esophageal reflux disease without esophagitis: Secondary | ICD-10-CM | POA: Diagnosis not present

## 2021-08-19 DIAGNOSIS — G4700 Insomnia, unspecified: Secondary | ICD-10-CM | POA: Diagnosis not present

## 2021-08-19 DIAGNOSIS — E559 Vitamin D deficiency, unspecified: Secondary | ICD-10-CM | POA: Diagnosis not present

## 2021-08-19 DIAGNOSIS — I10 Essential (primary) hypertension: Secondary | ICD-10-CM | POA: Diagnosis not present

## 2021-08-19 DIAGNOSIS — I7300 Raynaud's syndrome without gangrene: Secondary | ICD-10-CM | POA: Diagnosis not present

## 2021-08-19 DIAGNOSIS — C3491 Malignant neoplasm of unspecified part of right bronchus or lung: Secondary | ICD-10-CM | POA: Diagnosis not present

## 2021-08-19 DIAGNOSIS — I2510 Atherosclerotic heart disease of native coronary artery without angina pectoris: Secondary | ICD-10-CM | POA: Diagnosis not present

## 2021-08-19 NOTE — Progress Notes (Signed)
Orders received from Dr Julien Nordmann to decrease dose of Alimta to 400 mg/m2 with CrCl 34.4 ml/min. ? ?All plans updated. ? ?T.O. Dr Mohamed/Jaion Lagrange Ronnald Ramp, PharmD ?08/18/2021  ?

## 2021-08-22 ENCOUNTER — Ambulatory Visit: Payer: Medicare HMO | Admitting: Psychologist

## 2021-08-24 ENCOUNTER — Encounter: Payer: Self-pay | Admitting: Pulmonary Disease

## 2021-08-24 DIAGNOSIS — R911 Solitary pulmonary nodule: Secondary | ICD-10-CM

## 2021-08-24 NOTE — Telephone Encounter (Signed)
I called and spoke with the pt  ?She says her chest tightness and congestion in her chest is not improving since last ov  ?She requests that we order a vibratory vest for her ?She states not feeling worse, just no better  ?She stopped using mucinex bc she says it dried her out and did not help  ?She has a flutter valve that she uses "maybe twice a wk" ?I encouraged her to use this more, maybe 2 x per day after neb x 10 breaths  ?Please advise on vest or any other recs, thanks! ? ? ?

## 2021-08-29 ENCOUNTER — Other Ambulatory Visit: Payer: Self-pay | Admitting: Physician Assistant

## 2021-08-29 ENCOUNTER — Encounter (INDEPENDENT_AMBULATORY_CARE_PROVIDER_SITE_OTHER): Payer: Medicare HMO | Admitting: Ophthalmology

## 2021-08-29 DIAGNOSIS — E876 Hypokalemia: Secondary | ICD-10-CM

## 2021-09-01 ENCOUNTER — Telehealth: Payer: Self-pay | Admitting: Pharmacist

## 2021-09-01 NOTE — Telephone Encounter (Signed)
Patient called stating she needed her [employer] grant renewed ? ?CARD NO. ?[id number] ?  ?CARD STATUS ?Active ?  ?BIN ?[id number] ?  ?PCN ?[id number] ?  ?PC GROUP ?[id number] ? ?Called patient and gave her the information to give her pharmacy. ?

## 2021-09-05 NOTE — Progress Notes (Signed)
[facility] ?OFFICE PROGRESS NOTE ? ?[name], MD ?[place] ?[place] [address] ? ?DIAGNOSIS: Stage IV (T2 a,N2, M1a) non-small cell lung cancer, adenocarcinoma diagnosed in July 2019 and presented with right upper lobe lung mass in addition to mediastinal lymphadenopathy as well as bilateral pulmonary nodules and malignant right pleural effusion. ?  ?Biomarker Findings ?Microsatellite status - Cannot Be Determined ?Tumor Mutational Burden - Cannot Be Determined ?Genomic Findings ?For a complete list of the genes assayed, please refer to the Appendix. ?EGFR exon 19 deletion ([id number]) ?TP53 Y220C ?7 Disease relevant genes with no reportable alterations: KRAS, ALK, ?BRAF, MET, RET, ERBB2, ROS1  ? ?PRIOR THERAPY:  ?1) Status post right Pleurx catheter placement by Dr. Prescott Gum for drainage of malignant right pleural effusion. ?2) palliative radiotherapy to the enlarging right upper lobe lung mass and mediastinum under the care of Dr. Sondra Come expected to be completed on January 05, 2021. ?3) Tagrisso 80 mg p.o. daily.  First dose was given on 01/29/2018.  Status post 39 months of treatment. ? ?CURRENT THERAPY: Systemic chemotherapy with carboplatin for AUC of 5 and Alimta 500 Mg/M2 every 3 weeks.  First dose 03/17/2021.  The patient will also continue her current treatment with Tagrisso 80 mg p.o. daily.  She is status post 8 cycles. Starting from cycle #7, she will be on Alimta only 500 mg/m2.  ? ?INTERVAL HISTORY: ?Allison [name] 71 y.o. female returns to the clinic today for a follow-up visit.  The patient is feeling fair today without any new concerning complaints. She got a new vibrating vest which is supposed to help break up mucus that she is excited about, she is just not sure which setting that she needs to have this on. She received this from [facility] an it was prescribed by pulmonology. She thinks her breathing continues to get better. She is compliant with her  nebulizers. She tends to feel well in the mornings but feels fatigue by the end of the day. The patient has been following closely with palliative care which has been helpful for her, she is scheduled to see them later today.  She is currently prescribed Xanax for her anxiety.  She also has been taking Marinol for her appetite. She will need a refill of this. The patient has been taking Ensure as recommended by the nutritionist team.  Her weight is stable today.  The patient continues to have intermittent chest tightness for which she is taking Tylenol.  She is prescribed oxycodone and MS Contin by palliative care but she has not needed to take this and she tries to avoid narcotics if possible.  She is prescribed Nexium and hyoscyamine for abdominal discomfort. She changed her nexium to BID due to significant indigestion. Speaking of her medications, she feels that she takes too many medications. She reports she tried to cut back on some of her medications, including her aspirin because she feels it upsets her stomach. She also stopped some of her blood pressure medication and Prozac. She did not see much improvement with carafate so she stopped taking that as well.  ? ?Denies any fever or chills. Denies nausea or vomiting. Denies any constipation or vomiting. She recently had a restaging CT scan performed. She is here today for evaluation to review her scan results before starting cycle #9.  ? ? ? ?MEDICAL HISTORY: ?Past Medical History:  ?Diagnosis Date  ? Anemia   ? Anxiety   ? Arthritis   ?  Asthma   ? exercise induced  ? Depression   ? PMH  ? Dyspnea   ? GERD (gastroesophageal reflux disease)   ? Glaucoma   ? History of radiation therapy 01/05/2021  ? IMRT right lung  11/24/2020-01/05/2021  Dr [name]  ? Hypertension   ? lung ca dx'd 11/2017  ? right  ? Malignant pleural effusion   ? right  ? PONV (postoperative nausea and vomiting)   ? Pre-diabetes   ? Raynaud's disease   ? Raynaud's disease   ? Stroke [facility])  01/2021  ? balance off, some express aphasia, weakness  ? ? ?ALLERGIES:  is allergic to penicillins, vicodin [hydrocodone-acetaminophen], and other. ? ?MEDICATIONS:  ?Current Outpatient Medications  ?Medication Sig Dispense Refill  ? acetaminophen (TYLENOL) 500 MG tablet Take 1,000 mg by mouth every 6 (six) hours as needed (pain).    ? ALPRAZolam (XANAX) 0.25 MG tablet Take 1 tablet (0.25 mg total) by mouth 3 (three) times daily as needed for anxiety. May take 2 tablets (0.5 mg total) at bedtime. 60 tablet 0  ? amLODipine (NORVASC) 5 MG tablet Take 5 mg by mouth daily.    ? aspirin 81 MG chewable tablet Chew 1 tablet (81 mg total) by mouth daily.    ? brompheniramine-pseudoephedrine-DM 30-2-10 MG/5ML syrup Take 5 mLs by mouth 3 (three) times daily as needed. 120 mL 0  ? carvedilol (COREG) 12.5 MG tablet Take 1 tablet (12.5 mg total) by mouth 2 (two) times daily. 180 tablet 1  ? cetirizine (ZYRTEC) 5 MG tablet Take 5 mg by mouth daily.    ? chlorthalidone (HYGROTON) 25 MG tablet TAKE 1 TABLET (25 MG TOTAL) BY MOUTH DAILY. 90 tablet 1  ? dexamethasone (DECADRON) 4 MG tablet Take 1 tablet twice a day the day before, the day of, and the day after chemotherapy 40 tablet 2  ? dorzolamide-timolol (COSOPT) 22.3-6.8 MG/ML ophthalmic solution Place 1 drop into both eyes 2 (two) times daily.    ? dronabinol (MARINOL) 2.5 MG capsule Take 1 capsule (2.5 mg total) by mouth 2 (two) times daily before a meal. 60 capsule 0  ? enoxaparin (LOVENOX) 60 MG/0.6ML injection Inject 0.6 mLs (60 mg total) into the skin every 12 (twelve) hours. 60 mL 1  ? esomeprazole (NEXIUM) 20 MG capsule Take 1 capsule (20 mg total) by mouth in the morning and at bedtime. 60 capsule 2  ? ezetimibe (ZETIA) 10 MG tablet Take 10 mg by mouth daily.     ? FLUoxetine (PROZAC) 10 MG capsule Take 1 capsule (10 mg total) by mouth daily. 30 capsule 3  ? folic acid (FOLVITE) 1 MG tablet TAKE 1 TABLET BY MOUTH EVERY DAY 90 tablet 1  ? hyoscyamine (LEVSIN SL) 0.125 MG  SL tablet Place 2 tablets (0.25 mg total) under the tongue every 6 (six) hours as needed for cramping. Take 1-2 tablets under the tongue every 6 hours as needed for cramping 30 tablet 0  ? ipratropium-albuterol (DUONEB) 0.5-2.5 (3) MG/3ML SOLN Take 3 mLs by nebulization every 6 (six) hours as needed (Asthma).    ? isosorbide mononitrate (IMDUR) 30 MG 24 hr tablet Take 1 tablet (30 mg total) by mouth daily. 90 tablet 3  ? latanoprost (XALATAN) 0.005 % ophthalmic solution Place 1 drop into both eyes at bedtime.     ? lidocaine (LIDODERM) 5 % Place 1 patch onto the skin daily. Remove & Discard patch within 12 hours or as directed by MD 30 patch 0  ?  magnesium oxide (MAG-OX) 400 (240 Mg) MG tablet TAKE 1 TABLET BY MOUTH EVERY DAY 30 tablet 0  ? ondansetron (ZOFRAN-ODT) 4 MG disintegrating tablet Take 1 tablet (4 mg total) by mouth every 8 (eight) hours as needed for nausea or vomiting. 30 tablet 1  ? osimertinib mesylate (TAGRISSO) 80 MG tablet Take 1 tablet (80 mg total) by mouth daily. 30 tablet 3  ? OVER THE COUNTER MEDICATION Take 4 drops by mouth daily. Viatmin d    ? oxyCODONE (OXY IR/ROXICODONE) 5 MG immediate release tablet Take 1 tablet (5 mg total) by mouth every 4 (four) hours as needed for moderate pain. 30 tablet 0  ? potassium chloride SA (KLOR-CON M) 20 MEQ tablet Take 1 tablet (20 mEq total) by mouth daily. 5 tablet 0  ? prochlorperazine (COMPAZINE) 10 MG tablet Take 1 tablet (10 mg total) by mouth every 6 (six) hours as needed for nausea or vomiting. 30 tablet 0  ? REPATHA SURECLICK 222 MG/ML SOAJ Inject 140 mg into the skin every 14 (fourteen) days. 6 mL 3  ? sucralfate (CARAFATE) 1 g tablet Take 1 tablet (1 g total) by mouth 4 (four) times daily. with meals and at bedtime. Crush and dissolve in 10 mL of warm water prior to swallowing 120 tablet 0  ? ?No current facility-administered medications for this visit.  ? ?Facility-Administered Medications Ordered in Other Visits  ?Medication Dose Route  Frequency Provider Last Rate Last Admin  ? cyanocobalamin ((VITAMIN B-12)) injection 1,000 mcg  1,000 mcg Intramuscular Once Curt Bears, MD      ? heparin lock flush 100 unit/mL  500 Units Intracathete

## 2021-09-06 ENCOUNTER — Other Ambulatory Visit: Payer: Self-pay

## 2021-09-06 ENCOUNTER — Ambulatory Visit (HOSPITAL_COMMUNITY)
Admission: RE | Admit: 2021-09-06 | Discharge: 2021-09-06 | Disposition: A | Discharge status: Home / Self Care | Payer: Medicare HMO | Source: Ambulatory Visit | Attending: Internal Medicine | Admitting: Internal Medicine

## 2021-09-06 DIAGNOSIS — R911 Solitary pulmonary nodule: Secondary | ICD-10-CM | POA: Diagnosis not present

## 2021-09-06 DIAGNOSIS — C3490 Malignant neoplasm of unspecified part of unspecified bronchus or lung: Secondary | ICD-10-CM | POA: Diagnosis not present

## 2021-09-06 DIAGNOSIS — J90 Pleural effusion, not elsewhere classified: Secondary | ICD-10-CM | POA: Diagnosis not present

## 2021-09-06 DIAGNOSIS — J479 Bronchiectasis, uncomplicated: Secondary | ICD-10-CM | POA: Diagnosis not present

## 2021-09-06 DIAGNOSIS — K5730 Diverticulosis of large intestine without perforation or abscess without bleeding: Secondary | ICD-10-CM | POA: Diagnosis not present

## 2021-09-06 MED ORDER — SODIUM CHLORIDE (PF) 0.9 % IJ SOLN
INTRAMUSCULAR | Status: AC
  Filled 2021-09-06: qty 50

## 2021-09-06 MED ORDER — IOHEXOL 300 MG/ML  SOLN
80.0000 mL | Freq: Once | INTRAMUSCULAR | Status: AC | PRN
  Administered 2021-09-06: 80 mL via INTRAVENOUS

## 2021-09-06 MED ORDER — HEPARIN SOD (PORK) LOCK FLUSH 100 UNIT/ML IV SOLN: 500.0000 [IU] | Freq: Once | INTRAVENOUS | Status: AC

## 2021-09-06 MED ORDER — HEPARIN SOD (PORK) LOCK FLUSH 100 UNIT/ML IV SOLN
INTRAVENOUS | Status: AC
Start: 2021-09-06 — End: 2021-09-06
  Administered 2021-09-06: 500 [IU] via INTRAVENOUS
  Filled 2021-09-06: qty 5

## 2021-09-07 ENCOUNTER — Encounter: Payer: Self-pay | Admitting: General Practice

## 2021-09-07 NOTE — Progress Notes (Signed)
[place] Spiritual Care Note ? ?Follow Allison [name] through [facility] Cancer Support Group. Received call today requesting to schedule visit in infusion tomorrow. ? ?We also talked about tools for "heart check-ins," those deeper spiritual and emotional temperature checks and invitations to reflection: ? ?--Daily examen: looking at the "consolations" and "desolations" of the day (where the sacred felt close/distant) ? ?--Brags, Gratitudes, and Desires: practicing naming and claiming our accomplishments, experiences we're thankingful for, and wishes/yearnings ? ?We plan to follow up in person to practice these in infusion. ? ? ?Chaplain Lorrin Jackson, MDiv, [facility] ?Pager [phone] ?Voicemail [phone]  ?

## 2021-09-08 ENCOUNTER — Encounter: Payer: Self-pay | Admitting: Nurse Practitioner

## 2021-09-08 ENCOUNTER — Inpatient Hospital Stay: Payer: Medicare HMO

## 2021-09-08 ENCOUNTER — Encounter: Payer: Self-pay | Admitting: General Practice

## 2021-09-08 ENCOUNTER — Inpatient Hospital Stay (HOSPITAL_BASED_OUTPATIENT_CLINIC_OR_DEPARTMENT_OTHER): Payer: Medicare HMO | Admitting: Nurse Practitioner

## 2021-09-08 ENCOUNTER — Other Ambulatory Visit: Payer: Self-pay

## 2021-09-08 ENCOUNTER — Inpatient Hospital Stay: Payer: Medicare HMO | Admitting: Physician Assistant

## 2021-09-08 VITALS — BP 157/67 | HR 79 | Temp 98.1°F | Resp 16 | Wt 115.2 lb

## 2021-09-08 DIAGNOSIS — Z923 Personal history of irradiation: Secondary | ICD-10-CM | POA: Diagnosis not present

## 2021-09-08 DIAGNOSIS — Z5111 Encounter for antineoplastic chemotherapy: Secondary | ICD-10-CM | POA: Diagnosis not present

## 2021-09-08 DIAGNOSIS — C773 Secondary and unspecified malignant neoplasm of axilla and upper limb lymph nodes: Secondary | ICD-10-CM | POA: Diagnosis not present

## 2021-09-08 DIAGNOSIS — C3491 Malignant neoplasm of unspecified part of right bronchus or lung: Secondary | ICD-10-CM

## 2021-09-08 DIAGNOSIS — R5383 Other fatigue: Secondary | ICD-10-CM | POA: Diagnosis not present

## 2021-09-08 DIAGNOSIS — Z95828 Presence of other vascular implants and grafts: Secondary | ICD-10-CM

## 2021-09-08 DIAGNOSIS — E876 Hypokalemia: Secondary | ICD-10-CM

## 2021-09-08 DIAGNOSIS — C3411 Malignant neoplasm of upper lobe, right bronchus or lung: Secondary | ICD-10-CM | POA: Diagnosis not present

## 2021-09-08 DIAGNOSIS — F419 Anxiety disorder, unspecified: Secondary | ICD-10-CM

## 2021-09-08 DIAGNOSIS — J910 Malignant pleural effusion: Secondary | ICD-10-CM | POA: Diagnosis not present

## 2021-09-08 DIAGNOSIS — Z79899 Other long term (current) drug therapy: Secondary | ICD-10-CM | POA: Diagnosis not present

## 2021-09-08 DIAGNOSIS — R630 Anorexia: Secondary | ICD-10-CM | POA: Diagnosis not present

## 2021-09-08 DIAGNOSIS — Z515 Encounter for palliative care: Secondary | ICD-10-CM

## 2021-09-08 DIAGNOSIS — R530 Neoplastic (malignant) related fatigue: Secondary | ICD-10-CM | POA: Diagnosis not present

## 2021-09-08 DIAGNOSIS — R69 Illness, unspecified: Secondary | ICD-10-CM | POA: Diagnosis not present

## 2021-09-08 DIAGNOSIS — C3490 Malignant neoplasm of unspecified part of unspecified bronchus or lung: Secondary | ICD-10-CM

## 2021-09-08 DIAGNOSIS — Z66 Do not resuscitate: Secondary | ICD-10-CM | POA: Diagnosis not present

## 2021-09-08 LAB — CBC WITH DIFFERENTIAL (CANCER CENTER ONLY)
Abs Immature Granulocytes: 0.02 10*3/uL (ref 0.00–0.07)
Basophils Absolute: 0 10*3/uL (ref 0.0–0.1)
Basophils Relative: 0 %
Eosinophils Absolute: 0 10*3/uL (ref 0.0–0.5)
Eosinophils Relative: 0 %
HCT: 27.9 % — ABNORMAL LOW (ref 36.0–46.0)
Hemoglobin: 9.1 g/dL — ABNORMAL LOW (ref 12.0–15.0)
Immature Granulocytes: 1 %
Lymphocytes Relative: 11 %
Lymphs Abs: 0.5 10*3/uL — ABNORMAL LOW (ref 0.7–4.0)
MCH: 29.2 pg (ref 26.0–34.0)
MCHC: 32.6 g/dL (ref 30.0–36.0)
MCV: 89.4 fL (ref 80.0–100.0)
Monocytes Absolute: 0.3 10*3/uL (ref 0.1–1.0)
Monocytes Relative: 8 %
Neutro Abs: 3.4 10*3/uL (ref 1.7–7.7)
Neutrophils Relative %: 80 %
Platelet Count: 190 10*3/uL (ref 150–400)
RBC: 3.12 MIL/uL — ABNORMAL LOW (ref 3.87–5.11)
RDW: 15.7 % — ABNORMAL HIGH (ref 11.5–15.5)
WBC Count: 4.3 10*3/uL (ref 4.0–10.5)
nRBC: 0 % (ref 0.0–0.2)

## 2021-09-08 LAB — CMP (CANCER CENTER ONLY)
ALT: 30 U/L (ref 0–44)
AST: 31 U/L (ref 15–41)
Albumin: 3.9 g/dL (ref 3.5–5.0)
Alkaline Phosphatase: 62 U/L (ref 38–126)
Anion gap: 10 (ref 5–15)
BUN: 17 mg/dL (ref 8–23)
CO2: 26 mmol/L (ref 22–32)
Calcium: 9.7 mg/dL (ref 8.9–10.3)
Chloride: 102 mmol/L (ref 98–111)
Creatinine: 1.21 mg/dL — ABNORMAL HIGH (ref 0.44–1.00)
GFR, Estimated: 48 mL/min — ABNORMAL LOW (ref >60)
Glucose, Bld: 158 mg/dL — ABNORMAL HIGH (ref 70–99)
Potassium: 3.2 mmol/L — ABNORMAL LOW (ref 3.5–5.1)
Sodium: 138 mmol/L (ref 135–145)
Total Bilirubin: 0.2 mg/dL — ABNORMAL LOW (ref 0.3–1.2)
Total Protein: 7 g/dL (ref 6.5–8.1)

## 2021-09-08 LAB — SAMPLE TO BLOOD BANK

## 2021-09-08 MED ORDER — SODIUM CHLORIDE 0.9 % IV SOLN
400.0000 mg/m2 | Freq: Once | INTRAVENOUS | Status: AC
  Administered 2021-09-08: 600 mg via INTRAVENOUS
  Filled 2021-09-08: qty 20

## 2021-09-08 MED ORDER — HEPARIN SOD (PORK) LOCK FLUSH 100 UNIT/ML IV SOLN
500.0000 [IU] | Freq: Once | INTRAVENOUS | Status: AC | PRN
  Administered 2021-09-08: 500 [IU]

## 2021-09-08 MED ORDER — SODIUM CHLORIDE 0.9 % IV SOLN: Freq: Once | INTRAVENOUS | Status: AC

## 2021-09-08 MED ORDER — DRONABINOL 2.5 MG PO CAPS: 2.5000 mg | ORAL_CAPSULE | Freq: Two times a day (BID) | ORAL | 0 refills | Status: DC

## 2021-09-08 MED ORDER — POTASSIUM CHLORIDE CRYS ER 20 MEQ PO TBCR: 20.0000 meq | EXTENDED_RELEASE_TABLET | Freq: Every day | ORAL | 0 refills | Status: DC

## 2021-09-08 MED ORDER — SODIUM CHLORIDE 0.9% FLUSH
10.0000 mL | Freq: Once | INTRAVENOUS | Status: AC
  Administered 2021-09-08: 10 mL

## 2021-09-08 MED ORDER — SODIUM CHLORIDE 0.9% FLUSH
10.0000 mL | INTRAVENOUS | Status: DC | PRN
  Administered 2021-09-08: 10 mL

## 2021-09-08 MED ORDER — ONDANSETRON HCL 4 MG/2ML IJ SOLN
8.0000 mg | Freq: Once | INTRAMUSCULAR | Status: AC
  Administered 2021-09-08: 8 mg via INTRAVENOUS
  Filled 2021-09-08: qty 4

## 2021-09-08 MED ORDER — CYANOCOBALAMIN 1000 MCG/ML IJ SOLN
1000.0000 ug | Freq: Once | INTRAMUSCULAR | Status: AC
  Administered 2021-09-08: 1000 ug via INTRAMUSCULAR
  Filled 2021-09-08: qty 1

## 2021-09-08 NOTE — Progress Notes (Signed)
? ?  ?Palliative Medicine ?[place]  ?[phone] [phone] ? ? ?Name: Allison [name] ?Date: 09/08/2021 ?MRN: [id number]  ?DOB: 08-Feb-1951 ? ?Patient Care Team: ?[name], MD as PCP - General (Internal Medicine) ?Donato Heinz, MD as PCP - Cardiology (Cardiology) ?[name], MD as Consulting Physician (Radiation Oncology) ?Pickenpack-Cousar, Carlena Sax, NP as Nurse Practitioner (Nurse Practitioner) ?Valrie Hart, RN as Oncology Nurse Navigator (Oncology)  ? ? ?INTERVAL HISTORY: ?Allison [name] is a 71 y.o. female with stage IV non-small cell lung cancer (12/2017), hypertension, CVA, CAD, DVT/PE (on Lovenox), and GERD.  Palliative ask to see for symptom management and goals of care. ? ?SOCIAL HISTORY:    ? reports that she has never smoked. She has never used smokeless tobacco. She reports that she does not currently use alcohol. She reports that she does not use drugs. ? ?ADVANCE DIRECTIVES:  ?None on file  ? ?CODE STATUS: DNR ? ?PAST MEDICAL HISTORY: ?Past Medical History:  ?Diagnosis Date  ? Anemia   ? Anxiety   ? Arthritis   ? Asthma   ? exercise induced  ? Depression   ? PMH  ? Dyspnea   ? GERD (gastroesophageal reflux disease)   ? Glaucoma   ? History of radiation therapy 01/05/2021  ? IMRT right lung  11/24/2020-01/05/2021  Dr [name]  ? Hypertension   ? lung ca dx'd 11/2017  ? right  ? Malignant pleural effusion   ? right  ? PONV (postoperative nausea and vomiting)   ? Pre-diabetes   ? Raynaud's disease   ? Raynaud's disease   ? Stroke [facility]) 01/2021  ? balance off, some express aphasia, weakness  ? ? ?ALLERGIES:  is allergic to penicillins, vicodin [hydrocodone-acetaminophen], and other. ? ?MEDICATIONS:  ?Current Outpatient Medications  ?Medication Sig Dispense Refill  ? acetaminophen (TYLENOL) 500 MG tablet Take 1,000 mg by mouth every 6 (six) hours as needed (pain).    ? ALPRAZolam (XANAX) 0.25 MG tablet Take 1 tablet (0.25 mg total) by mouth 3  (three) times daily as needed for anxiety. May take 2 tablets (0.5 mg total) at bedtime. 60 tablet 0  ? amLODipine (NORVASC) 5 MG tablet Take 5 mg by mouth daily.    ? aspirin 81 MG chewable tablet Chew 1 tablet (81 mg total) by mouth daily.    ? brompheniramine-pseudoephedrine-DM 30-2-10 MG/5ML syrup Take 5 mLs by mouth 3 (three) times daily as needed. 120 mL 0  ? carvedilol (COREG) 12.5 MG tablet Take 1 tablet (12.5 mg total) by mouth 2 (two) times daily. 180 tablet 1  ? cetirizine (ZYRTEC) 5 MG tablet Take 5 mg by mouth daily.    ? chlorthalidone (HYGROTON) 25 MG tablet TAKE 1 TABLET (25 MG TOTAL) BY MOUTH DAILY. 90 tablet 1  ? dexamethasone (DECADRON) 4 MG tablet Take 1 tablet twice a day the day before, the day of, and the day after chemotherapy 40 tablet 2  ? dorzolamide-timolol (COSOPT) 22.3-6.8 MG/ML ophthalmic solution Place 1 drop into both eyes 2 (two) times daily.    ? dronabinol (MARINOL) 2.5 MG capsule Take 1 capsule (2.5 mg total) by mouth 2 (two) times daily before a meal. 60 capsule 0  ? enoxaparin (LOVENOX) 60 MG/0.6ML injection Inject 0.6 mLs (60 mg total) into the skin every 12 (twelve) hours. 60 mL 1  ? esomeprazole (NEXIUM) 20 MG capsule Take 1 capsule (20 mg total) by mouth in the morning and at bedtime. 60 capsule 2  ?  ezetimibe (ZETIA) 10 MG tablet Take 10 mg by mouth daily.     ? FLUoxetine (PROZAC) 10 MG capsule Take 1 capsule (10 mg total) by mouth daily. 30 capsule 3  ? folic acid (FOLVITE) 1 MG tablet TAKE 1 TABLET BY MOUTH EVERY DAY 90 tablet 1  ? hyoscyamine (LEVSIN SL) 0.125 MG SL tablet Place 2 tablets (0.25 mg total) under the tongue every 6 (six) hours as needed for cramping. Take 1-2 tablets under the tongue every 6 hours as needed for cramping 30 tablet 0  ? ipratropium-albuterol (DUONEB) 0.5-2.5 (3) MG/3ML SOLN Take 3 mLs by nebulization every 6 (six) hours as needed (Asthma).    ? isosorbide mononitrate (IMDUR) 30 MG 24 hr tablet Take 1 tablet (30 mg total) by mouth daily. 90  tablet 3  ? latanoprost (XALATAN) 0.005 % ophthalmic solution Place 1 drop into both eyes at bedtime.     ? magnesium oxide (MAG-OX) 400 (240 Mg) MG tablet TAKE 1 TABLET BY MOUTH EVERY DAY 30 tablet 0  ? ondansetron (ZOFRAN-ODT) 4 MG disintegrating tablet Take 1 tablet (4 mg total) by mouth every 8 (eight) hours as needed for nausea or vomiting. 30 tablet 1  ? osimertinib mesylate (TAGRISSO) 80 MG tablet Take 1 tablet (80 mg total) by mouth daily. 30 tablet 3  ? potassium chloride SA (KLOR-CON M) 20 MEQ tablet Take 1 tablet (20 mEq total) by mouth daily. 5 tablet 0  ? prochlorperazine (COMPAZINE) 10 MG tablet Take 1 tablet (10 mg total) by mouth every 6 (six) hours as needed for nausea or vomiting. 30 tablet 0  ? REPATHA SURECLICK 409 MG/ML SOAJ Inject 140 mg into the skin every 14 (fourteen) days. 6 mL 3  ? ?No current facility-administered medications for this visit.  ? ?Facility-Administered Medications Ordered in Other Visits  ?Medication Dose Route Frequency Provider Last Rate Last Admin  ? sodium chloride flush (NS) 0.9 % injection 10 mL  10 mL Intracatheter PRN Curt Bears, MD   10 mL at 09/08/21 1314  ? ? ?VITAL SIGNS: ?LMP  (LMP Unknown)  ?There were no vitals filed for this visit.  ?Estimated body mass index is 19.77 kg/m? as calculated from the following: ?  Height as of 08/18/21: 5\' 4"  (1.626 m). ?  Weight as of an earlier encounter on 09/08/21: 115 lb 3.2 oz (52.3 kg). ? ? ?PERFORMANCE STATUS (ECOG) : 1 - Symptomatic but completely ambulatory ? ? ?Physical Exam ?General: NAD ?Cardiovascular: RRR ?Pulmonary: clear ant fields ?Abdomen: soft, nontender, + bowel sounds ?Extremities: no edema, no joint deformities ?Neurological: AAO x3, mood appropriate  ? ?IMPRESSION: ? ?Allison [name] is here today for follow-up. She is doing well. No acute distress noted. Continues to do well at home. She has purchased a chest percussion vest to assist with loosening her mucous. Plans to follow-up with Dr. Valeta Harms regarding  use and parameters.  ? ?Fatigue  ?Improved. Seems to be more situational. Increased fatigue more noticeable in conjunction with her activity levels. Encouraged her to listen to her body and take frequent rest breaks as needed. ?  ?Anxiety ?Improved. Esthela shares her anxiety is much more controlled with the Xanax. Is taking as needed. Does not have to take as often as before. She shares she was trying to decrease the amount of medications that she is taking by eliminated those she did not feel necessary. She has discontinued Prozac. I advised to restart and follow-up with her PCP as she should not abruptly stop  with high risk of unwanted symptoms. Education provided on tapering requirements and close monitoring by a medical provider. She verbalized understanding and will restart.  ?  ?Appetite ?Appetite continues to improve. Improvement in her indigestion. Tolerating the nexium. Hopefully this will provide better relief. Education provided on continued increase in protein enriched foods. She is drinking Ensure daily. Discussed consuming greek yogurt, fruits, vegetables, and nuts which she is doing. Also had conversations of how to be creative with protein drinks to provide more versatile options while also aiding in decrease in reflux.  ? ?Her weight is up to 115.3 lbs from 113lbs on 3/2. ? ? ?PLAN: ?Symptoms are well controlled. Appetite is improving. She is being followed by the dietician.  ?Nexium twice daily for indigestion. Tolerating well.  ?Continue xanax for anxiety as needed. Recommended to restart Prozac and not to abruptly stop. Will need to follow-up with PCP if she wishes to discontinue and allow for close monitoring and taper.  ?I will plan to see back in 3-4 weeks.  ? ? ?Patient expressed understanding and was in agreement with this plan. She also understands that She can call the clinic at any time with any questions, concerns, or complaints.  ? ?Time Total: 20 min ? ?Visit consisted of counseling and  education dealing with the complex and emotionally intense issues of symptom management and palliative care in the setting of serious and potentially life-threatening illness.Greater than 50%  of th

## 2021-09-08 NOTE — Progress Notes (Signed)
[place] Spiritual Care Note ? ?Allison [name] was in good spirits on this follow-up visit in infusion. Her speech was less breathy and labored that it was on the phone yesterday. She notes that some of the energy difference may be from steroids (doses yesterday, today, and tomorrow). ? ?Allison [name] is finding the practice of daily examen, paying attention to her consolations (positives, moments of divine presence) and desolations (harder times, feelings of absence or lack of connection), very helpful. In particular, naming her consolations seems to function like a Dentist. ? ?Provided empathic listening, emotional support, encouragement, and prayer per request. We plan to follow up in infusion and via Lung Cancer Support Group. ? ? ? ? ?Chaplain Lorrin Jackson, MDiv, [facility] ?Pager [phone] ?Voicemail [phone]  ?

## 2021-09-08 NOTE — Patient Instructions (Signed)
[facility]  Discharge Instructions: ?Thank you for choosing [facility] to provide your oncology and hematology care.  ? ?If you have a lab appointment with the [facility], please go directly to the [facility] and check in at the registration area. ?  ?Wear comfortable clothing and clothing appropriate for easy access to any Portacath or PICC line.  ? ?We strive to give you quality time with your provider. You may need to reschedule your appointment if you arrive late (15 or more minutes).  Arriving late affects you and other patients whose appointments are after yours.  Also, if you miss three or more appointments without notifying the office, you may be dismissed from the clinic at the provider?s discretion.    ?  ?For prescription refill requests, have your pharmacy contact our office and allow 72 hours for refills to be completed.   ? ?Today you received the following chemotherapy and/or immunotherapy agents: Alimta ?  ?To help prevent nausea and vomiting after your treatment, we encourage you to take your nausea medication as directed. ? ?BELOW ARE SYMPTOMS THAT SHOULD BE REPORTED IMMEDIATELY: ?*FEVER GREATER THAN 100.4 F (38 ?C) OR HIGHER ?*CHILLS OR SWEATING ?*NAUSEA AND VOMITING THAT IS NOT CONTROLLED WITH YOUR NAUSEA MEDICATION ?*UNUSUAL SHORTNESS OF BREATH ?*UNUSUAL BRUISING OR BLEEDING ?*URINARY PROBLEMS (pain or burning when urinating, or frequent urination) ?*BOWEL PROBLEMS (unusual diarrhea, constipation, pain near the anus) ?TENDERNESS IN MOUTH AND THROAT WITH OR WITHOUT PRESENCE OF ULCERS (sore throat, sores in mouth, or a toothache) ?UNUSUAL RASH, SWELLING OR PAIN  ?UNUSUAL VAGINAL DISCHARGE OR ITCHING  ? ?Items with * indicate a potential emergency and should be followed up as soon as possible or go to the Emergency Department if any problems should occur. ? ?Please show the CHEMOTHERAPY ALERT CARD or IMMUNOTHERAPY ALERT CARD at check-in to the  Emergency Department and triage nurse. ? ?Should you have questions after your visit or need to cancel or reschedule your appointment, please contact [facility]  Dept: [phone]  and follow the prompts.  Office hours are 8:00 a.m. to 4:30 p.m. Monday - Friday. Please note that voicemails left after 4:00 p.m. may not be returned until the following business day.  We are closed weekends and major holidays. You have access to a nurse at all times for urgent questions. Please call the main number to the clinic Dept: [phone] and follow the prompts. ? ? ?For any non-urgent questions, you may also contact your provider using MyChart. We now offer e-Visits for anyone 89 and older to request care online for non-urgent symptoms. For details visit [email or web address]. ?  ?Also download the MyChart app! Go to the app store, search "MyChart", open the app, select [facility], and log in with your MyChart username and password. ? ?Due to Covid, a mask is required upon entering the hospital/clinic. If you do not have a mask, one will be given to you upon arrival. For doctor visits, patients may have 1 support person aged 2 or older with them. For treatment visits, patients cannot have anyone with them due to current Covid guidelines and our immunocompromised population.  ? ?

## 2021-09-12 ENCOUNTER — Other Ambulatory Visit: Payer: Self-pay

## 2021-09-12 ENCOUNTER — Ambulatory Visit: Payer: Medicare HMO | Admitting: Pulmonary Disease

## 2021-09-12 ENCOUNTER — Encounter: Payer: Self-pay | Admitting: Pulmonary Disease

## 2021-09-12 VITALS — BP 124/62 | HR 73 | Temp 98.1°F | Ht 64.0 in | Wt 115.6 lb

## 2021-09-12 DIAGNOSIS — C3491 Malignant neoplasm of unspecified part of right bronchus or lung: Secondary | ICD-10-CM | POA: Diagnosis not present

## 2021-09-12 DIAGNOSIS — C3490 Malignant neoplasm of unspecified part of unspecified bronchus or lung: Secondary | ICD-10-CM

## 2021-09-12 DIAGNOSIS — Z923 Personal history of irradiation: Secondary | ICD-10-CM | POA: Diagnosis not present

## 2021-09-12 DIAGNOSIS — J479 Bronchiectasis, uncomplicated: Secondary | ICD-10-CM | POA: Diagnosis not present

## 2021-09-12 DIAGNOSIS — R058 Other specified cough: Secondary | ICD-10-CM | POA: Diagnosis not present

## 2021-09-12 NOTE — Progress Notes (Signed)
? ?Synopsis: Referred in October 2022 for stage IV lung cancer by [name], MD ? ?Subjective:  ? ?PATIENT ID: Allison [name] GENDER: female DOB: 02-Nov-1950, MRN: [id number] ? ?Chief Complaint  ?Patient presents with  ? Follow-up  ?  Follow up  ? ? ?This is a 71 year old female, past medical history of stage IV adenocarcinoma of the lung.  Was initially diagnosed with a pleural effusion in July 2019.  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  Initially had a Pleurx catheter placed.  Had palliative radiotherapy and radiation as well as placement on Tagrisso.  Overall doing well for treatment.  Unfortunately CT imaging revealed progression of disease.  Has started chemotherapy with Dr. [name].  CT scan of the chest on 03/04/2021 was completed.  She had evidence of septal thickening nodularity extensive pleural thickening loculated right effusion concern for lymphangitic spread.  Patient was referred today to discuss bronchoscopy and biopsy.  Some of these lesions have a chance for additional mutations with therapy.  Therefore oncology is requesting a repeat biopsy. ? ?OV 05/05/2021: Here today for follow-up after bronchoscopy.  Patient was taken for navigational bronchoscopy as well as transbronchial biopsies to the persistent infiltrates on the right lung.  Tissue from the mass on the left was negative for malignancy.  Patient also underwent transbronchial biopsies on the right.  This was also negative for malignancy.  Unfortunately, she developed a right-sided pneumothorax post endoscopy had a chest tube and a short hospitalization afterwards.  She is doing much better since her hospitalization.After bronchoscopy patient had follow-up on 04/07/2021 with medical oncology.  Office note reviewed.  Unfortunately felt to have progression of disease.  Discussed several options palliative versus treatment regimen versus arranging restaging CTs or referral for second opinion.  We  took patient on 04/21/2021 for repeat bronchoscopy. Follow-up appointment on 04/28/2021 with Dr. [name]. ? ?OV 07/27/2021: Here today for follow-up.Currently undergoing treatments with the cancer center as well as outpatient follow-up with palliative care.  From respiratory standpoint she is doing well.  She does have daily sputum production.  She does have a recent CT scan of the chest that was completed at [facility].  This shows stability of her extensive radiation changes and lesions within the chest.  The pleural fluid collection between the major fissure has resolved.  No other concerning findings of distant metastatic disease.  From her cough and sputum production standpoint she is using her nebulizer in the morning and at nighttime before bed. ? ?OV 09/12/2021: Here today for ongoing symptoms with cough and sputum production.  She recently had a new vest therapy started.  She is doing really well with the vest therapy has some questions about it.  She is wondering about which settings and modes make her do better.  I explained that most of this is done with symptom relief and how well she gets her cough and sputum production cleared.  She is able to tolerate at least 2 sections a day. ? ? ?Oncology History Overview Note  ?Patient presented with cough, congestion, SOB, fatigue, and wight loss.  Work up showed metastatic disease.  ?  ?Adenocarcinoma of right lung, stage 4 ([place])  ?12/11/2017 Imaging  ? CT Chest ?IMPRESSION: ?1. Large right pleural effusion with volume loss of much of the ?right lung. ?2. Right upper lobe spiculated mass of 2.3 x 2.9 cm suspicious for ?primary lung carcinoma. ?3. Several small nodules throughout the right  lung and left lung ?suspicious for metastases. Some of the nodules on the right are ?intimately associated with pleura and could represent pleural ?metastases. ?4. Questionable soft tissue nodule in the left upper abdomen which ?could represent a metastatic  lesion. ?5. Mixed lesion within the T9 vertebral body. Cannot exclude a ?metastatic process. ?  ?12/19/2017 Procedure  ? Thoracentesis ?  ?12/26/2017 Imaging  ? PET ?IMPRESSION: ?1. Hypermetabolic spiculated 3.3 cm apical right upper lobe lung ?mass, compatible with primary bronchogenic adenocarcinoma. ?2. Hypermetabolic right hilar and right paratracheal ?lymphadenopathy. ?3. Moderate dependent right pleural effusion with mild right pleural ?thickening, compatible with known malignant right pleural effusion. ?4. Hypermetabolic 1.6 cm anterior right lower lobe nodule associated ?with the major fissure, compatible with pulmonary metastasis. ?Numerous additional subcentimeter pulmonary nodules scattered in ?both lungs, below PET resolution, suspicious for pulmonary ?metastases. Recommend attention on follow-up chest CT in 3 months ?  ?01/04/2018 Initial Diagnosis  ? Adenocarcinoma of right lung, stage 4 ([place]) ?  ?01/10/2018 Imaging  ? MRI Brain  ?No evidence of intracranial metastases ?  ?01/29/2018 -  Chemotherapy  ? Oral biologic Tagrisso p.o. 80 mg ? ?  ?08/16/2020 Cancer Staging  ? Staging form: Lung, AJCC 8th Edition ?- Clinical: Stage IVA (cT2a, cN2, cM1a) - Signed by Curt Bears, MD on 08/16/2020 ? ?  ?03/17/2021 -  Chemotherapy  ? Patient is on Treatment Plan : LUNG NSCLC Pemetrexed + Carboplatin q21d x 4 Cycles  ?   ? ? ? ?Past Medical History:  ?Diagnosis Date  ? Anemia   ? Anxiety   ? Arthritis   ? Asthma   ? exercise induced  ? Depression   ? PMH  ? Dyspnea   ? GERD (gastroesophageal reflux disease)   ? Glaucoma   ? History of radiation therapy 01/05/2021  ? IMRT right lung  11/24/2020-01/05/2021  Dr [name]  ? Hypertension   ? lung ca dx'd 11/2017  ? right  ? Malignant pleural effusion   ? right  ? PONV (postoperative nausea and vomiting)   ? Pre-diabetes   ? Raynaud's disease   ? Raynaud's disease   ? Stroke [facility]) 01/2021  ? balance off, some express aphasia, weakness  ?  ? ?Family History  ?Problem  Relation Age of Onset  ? Heart disease Sister   ? Heart disease Brother   ? Lung cancer Other   ?  ? ?Past Surgical History:  ?Procedure Laterality Date  ? ABDOMINAL HYSTERECTOMY    ? partial  ? BRONCHIAL BIOPSY  04/21/2021  ? Procedure: BRONCHIAL BIOPSIES;  Surgeon: [name], DO;  Location: [facility];  Service: Pulmonary;;  ? BRONCHIAL BRUSHINGS  04/21/2021  ? Procedure: BRONCHIAL BRUSHINGS;  Surgeon: [name], DO;  Location: [facility];  Service: Pulmonary;;  ? BRONCHIAL NEEDLE ASPIRATION BIOPSY  04/21/2021  ? Procedure: BRONCHIAL NEEDLE ASPIRATION BIOPSIES;  Surgeon: [name], DO;  Location: [facility];  Service: Pulmonary;;  ? CHEST TUBE INSERTION Right 01/01/2018  ? Procedure: INSERTION PLEURAL DRAINAGE CATHETER;  Surgeon: Ivin Poot, MD;  Location: [facility];  Service: Thoracic;  Laterality: Right;  ? CHEST TUBE INSERTION  04/21/2021  ? Procedure: CHEST TUBE INSERTION;  Surgeon: [name], DO;  Location: [facility];  Service: Pulmonary;;  ? COLONOSCOPY    ? CORONARY STENT INTERVENTION N/A 06/16/2019  ? Procedure: CORONARY STENT INTERVENTION;  Surgeon: Jettie Booze, MD;  Location: [facility] CV [facility];  Service: Cardiovascular;  Laterality: N/A;  ?  DILATION AND CURETTAGE OF UTERUS    ? EYE SURGERY    ? due to Glaucoma  ? IR IMAGING GUIDED PORT INSERTION  03/22/2021  ? IR PORT REPAIR CENTRAL VENOUS ACCESS DEVICE  04/15/2021  ? LEFT HEART CATH AND CORONARY ANGIOGRAPHY N/A 06/16/2019  ? Procedure: LEFT HEART CATH AND CORONARY ANGIOGRAPHY;  Surgeon: Jettie Booze, MD;  Location: [facility];  Service: Cardiovascular;  Laterality: N/A;  ? REMOVAL OF PLEURAL DRAINAGE CATHETER Right 11/07/2018  ? Procedure: REMOVAL OF PLEURAL DRAINAGE CATHETER;  Surgeon: Ivin Poot, MD;  Location: [facility];  Service: Thoracic;  Laterality: Right;  ? ROTATOR CUFF REPAIR    ? TUBAL LIGATION    ? VIDEO BRONCHOSCOPY WITH ENDOBRONCHIAL NAVIGATION Bilateral 04/21/2021  ? Procedure:  VIDEO BRONCHOSCOPY WITH ENDOBRONCHIAL NAVIGATION;  Surgeon: [name], DO;  Location: [facility];  Service: Pulmonary;  Laterality: Bilateral;  ION  ? WISDOM TOOTH EXTRACTION    ? ? ?Social History  ? ?[employer]

## 2021-09-12 NOTE — Patient Instructions (Signed)
Thank you for visiting Dr. Valeta Harms at [facility]. ?Today we recommend the following: ? ?Keep up the good work.  ?Call us if your symptoms change ? ?Return in about 6 months (around 03/15/2022) for w/ Dr. Valeta Harms . ? ? ? ?Please do your part to reduce the spread of COVID-19.  ? ?

## 2021-09-14 ENCOUNTER — Encounter (INDEPENDENT_AMBULATORY_CARE_PROVIDER_SITE_OTHER): Payer: Self-pay

## 2021-09-14 DIAGNOSIS — H401131 Primary open-angle glaucoma, bilateral, mild stage: Secondary | ICD-10-CM | POA: Diagnosis not present

## 2021-09-14 DIAGNOSIS — H2512 Age-related nuclear cataract, left eye: Secondary | ICD-10-CM | POA: Diagnosis not present

## 2021-09-14 DIAGNOSIS — H35371 Puckering of macula, right eye: Secondary | ICD-10-CM | POA: Diagnosis not present

## 2021-09-14 DIAGNOSIS — Z961 Presence of intraocular lens: Secondary | ICD-10-CM | POA: Diagnosis not present

## 2021-09-15 ENCOUNTER — Other Ambulatory Visit: Payer: Medicare HMO | Admitting: Internal Medicine

## 2021-09-15 ENCOUNTER — Encounter: Payer: Self-pay | Admitting: Internal Medicine

## 2021-09-15 VITALS — BP 148/72 | HR 72

## 2021-09-15 DIAGNOSIS — F419 Anxiety disorder, unspecified: Secondary | ICD-10-CM

## 2021-09-15 DIAGNOSIS — Z9189 Other specified personal risk factors, not elsewhere classified: Secondary | ICD-10-CM

## 2021-09-15 DIAGNOSIS — R69 Illness, unspecified: Secondary | ICD-10-CM | POA: Diagnosis not present

## 2021-09-15 DIAGNOSIS — K117 Disturbances of salivary secretion: Secondary | ICD-10-CM

## 2021-09-15 DIAGNOSIS — Z515 Encounter for palliative care: Secondary | ICD-10-CM | POA: Diagnosis not present

## 2021-09-15 DIAGNOSIS — C3491 Malignant neoplasm of unspecified part of right bronchus or lung: Secondary | ICD-10-CM

## 2021-09-15 MED ORDER — DRONABINOL 2.5 MG PO CAPS: 2.5000 mg | ORAL_CAPSULE | Freq: Two times a day (BID) | ORAL | 0 refills | Status: DC | PRN

## 2021-09-15 NOTE — Progress Notes (Signed)
Therapist, nutritional Palliative Care Follow-Up Visit Telephone: [phone]  Fax: [phone]   Date of encounter: 09/15/21 11:35 AM PATIENT NAME: Allison [name] [address] [place] [address]   [phone] (home)  DOB: Jan 26, 1951 MRN: [id number] PRIMARY CARE PROVIDER:    [name], MD,  [address] [place] [address] [phone]  REFERRING PROVIDER:   [name], MD [address] [place],  [place] [address] [phone]  RESPONSIBLE PARTY:    Contact Information     Name Relation Home Work [place] Clearence Cheek [phone]  [phone]   Dafoe,[name] Daughter [phone]  [phone]   Bradly Bienenstock Daughter [phone]  [phone]   Diloreto,[name] Daughter [phone]  [phone]        I met face to face with patient and daughter, Allison [name], in her home. Palliative Care was asked to follow this patient by consultation request of  Allison Devon, MD to address advance care planning and complex medical decision making. This is follow-up visit.                                     ASSESSMENT AND PLAN / RECOMMENDATIONS:   Advance Care Planning/Goals of Care: Goals include to maximize quality of life and symptom management. Patient/health care surrogate gave his/her permission to discuss.Our advance care planning conversation included a discussion about:    Goals at this time are to continue to do her maintenance chemotherapy, attend silver sne[employer] and [facility], she wants to take fewer meds so we did update her meds according to what she was taking and she is to contact cardiology about how she's not taking her baby asa due to GI upset CODE STATUS:  DNR, limited additional interventions, abx if indicated, IVF trial, and feeding tube trial as completed 03/01/21   Symptom Management/Plan: 1. Adenocarcinoma of right lung, stage 4  (HCC) -since I last saw her, she had f/u imaging and bronch with bx that indicated no progression of her cancer and that some of the changes seen were radiation bronchiectasis -continues on maintenance chemo with permetrexed every 3 wks through Allison [name] -continue palliative support through [facility] at clinic and myself in her home  2. Anxiety -has been worse recently, but pt stopped her prozac--explained that this is likely why she's felt like she's needed more xanax as nothing else has really changed with her, in fact her activity level is better though she moves slower, and her appetite and intake remain poor -encouraged her to resume the prozac (at one point she said she did restart after her visit last week, but later it was not so clear that she had) -discussed that there are other antidepressant options that can take the edge of her anxiety, but since she did well previously with the prozac, I recommended letting it get back to therapeutic levels which could take another 3 weeks after stopping it -cont prn xanax and use of tylenol which seems to help her worry less at bedtime though it's not for that  3. Increased oropharyngeal secretions -continue new vest which has helped tremendously, may use nebs more consistently also which may help so she has less difficulty with the bad taste from the phlegm in her mouth and throat,   4. At risk for polypharmacy -pt has stopped most of her cardiac meds due to weakness, dizziness and  just being sick and tired of spending her day taking pills -we reviewed which ones could potentially be stopped as long as her bp does not continue to trend up (amlodipine, chlorthalidone), imdur if she is not having chest pain of the anginal sort and she's not, compazine b/c it didn't work when she took it, zetia b/c she's on repatha and says her labs were great when Allison [name] checked them in the summer last year on this regimen (and she doesn't eat much now so  unlikely to go up and effect of zetia is small to begin with) -discussed benefit of baby asa being different from lovenox (had DVT with lung cancer) so she reluctantly was going to retry this with food and let Allison [name] know she had stopped it due to GI upset--I will copy him on this note -her bp will need to be monitored closely at her numerous medical visits to see if any of her bp meds should be resumed or coreg increased as we kept it due to her tachycardia she's prone to getting  5. Palliative care encounter -reviewed current goals as above -completed paperwork for [facility] so she can participate there as tolerated -pt remains positive and motivated  -has MOST on file which I will review to ensure no changes needed when I go out to see her next time  Follow up Palliative Care Visit: Palliative care will continue to follow for complex medical decision making, advance care planning, and clarification of goals.   Oct 30, 2021  This visit was coded based on medical decision making (MDM).  PPS: 60%  HOSPICE ELIGIBILITY/DIAGNOSIS:  Yes, but goals not consistent at this time/stg IV lung ca  Chief Complaint: Follow-up palliative visit at home  HISTORY OF PRESENT ILLNESS:  Allison [name] is a 71 y.o. year old female  with stage IV lung cancer diagnosed when she had a pleural effusion in June 2019 .   Patient reports increased anxiety. She has Xanax and this works but she does not want to keep taking pills. Stopped all meds, including Prozac, for a few weeks due to feeling overwhelmed with pill burden. This is likely contributing yo increased anxiety. Has tried mindfulness and focused breathing but this is not helpful. Anxiety has been interfering with daily life. Discussed risks/benefits of continuing with  Not feeling as lightheaded. Using a xanax at night about 3 times per week.  She had cataract surgery with Dr. Katy [name] but still not seeing well.  Has something wrong in  her retina so Dr. Zadie [name] is seeing her next week.  She had issues with her eye pressure going up with cataracts.  Uses tylenol in the daytime to ease quivering and nervousness.  It makes her feel a little better though she knows that tylenol is not for anxiety.    Just got certified to do her repatha through the end of the year.  She has stopped zetia.    BP has been good--126/62 Tuesday.  She stopped her amlodipine and chlorthalidone.  Still using coreg.  Stopped her baby asa.  Stopped dronabinol due to fatigue.  Appetite still lousy with yucky taste and mucus in her throat.    Now has neuropathy--feels prickly with her clothes against her--it's all over, but mostly on her legs.  Xanax helps to get that off her mind so she can sleep.  She tosses and turns in the bed.  She's on q 3 weeks maintenance chemo now.  Only uses  zofran around chemo time (up to 2-3 days after chemo).  Does neb probably qod.  Vest very helpful.  She wants to enroll in the [employer] program for cancer survivors.    We did a letter for her to have her mail delivered to the door--she and her daughter are following up on what happened there b/c it has not yet changed.  Ihor Gully is getting picked up at her door.    Last labs reviewed:  hgb 9.1, GFR 48 with cr 1.21, alb 3.9 on 09/08/21 CT and bronch with bx reviewed as mentioned.  History obtained from review of EMR, discussion with primary team, and interview with family, facility staff/caregiver and/or Ms. Cain Sieve.   I reviewed available labs, medications, imaging, studies and related documents from the EMR.  Records reviewed and summarized above.   ROS  General: NAD EYES: denies vision changes--cataract surgery did not help her sight so has f/u with retina specialist ENMT: denies dysphagia Cardiovascular: denies chest pain any longer with vest use, tightness much better, has DOE Pulmonary: has cough, denies increased SOB--not changed Abdomen: endorses poor  appetite--continues to try different shakes, denies constipation, endorses continence of bowel GU: denies dysuria, endorses continence of urine MSK:  has increased weakness,  no falls reported Skin: denies rashes or wounds Neurological: denies pain, denies insomnia, no syncopal episodes since I saw her last Psych: Endorses positive mood but anxiety has been signficant after stopping prozac which she did not realize might help that. Heme/lymph/immuno: denies bruises, abnormal bleeding  Physical Exam: Current and past weights:  115 lbs 3/27 Constitutional: NAD General: frail appearing, thin/WNWD/obese  EYES: anicteric sclera, lids intact, no discharge  ENMT: intact hearing, oral mucous membranes moist, dentition intact CV: S1S2, RRR, no LE edema Pulmonary: LCTA, no increased work of breathing, no cough, room air Abdomen: intake 100%, normo-active BS + 4 quadrants, soft and non tender, no ascites GU: deferred MSK: no sarcopenia, moves all extremities, ambulatory Skin: warm and dry, no rashes or wounds on visible skin Neuro:  no generalized weakness,  no cognitive impairment Psych: non-anxious affect, A and O x 3 Hem/lymph/immuno: no widespread bruising  CURRENT PROBLEM LIST:  Patient Active Problem List   Diagnosis Date Noted   Port-A-Cath in place 07/07/2021   Protein-calorie malnutrition, severe 04/25/2021   Pneumothorax after biopsy 04/21/2021   Lung nodule 04/01/2021   Epiretinal membrane, right eye 02/28/2021   Nuclear sclerotic cataract of right eye 02/28/2021   Nuclear sclerotic cataract of left eye 02/28/2021   Coronary artery disease of native artery of native heart with stable angina pectoris (HCC)    CVA (cerebral vascular accident) (HCC) 02/12/2021   Current use of long term anticoagulation 02/12/2021   Hypokalemia 02/12/2021   Leg DVT (deep venous thromboembolism), acute, bilateral (HCC) 02/08/2021   Aortic atherosclerosis (HCC) 02/08/2021   Respiratory failure (HCC)  02/08/2021   Postobstructive pneumonia 02/07/2021   Pulmonary embolism (HCC) 02/07/2021   Elevated troponin 02/07/2021   Encounter to establish care 02/02/2021   Shortness of breath 02/02/2021   Cough 02/02/2021   Statin myopathy 12/03/2020   Acute non-recurrent frontal sinusitis 11/18/2020   NSCLC with EGFR mutation (HCC) 06/30/2020   Hx of chest tube placement 02/11/2020   History of chest tube placement 11/12/2019   Abnormal liver function 08/20/2019   Gastroesophageal reflux disease without esophagitis 08/20/2019   Generalized anxiety disorder 08/20/2019   Glaucoma 08/20/2019   Hyperlipidemia 08/20/2019   Menopausal syndrome 08/20/2019   Raynaud's phenomenon 08/20/2019  Vitamin D deficiency 08/20/2019   Status post coronary artery stent placement    History of non-ST elevation myocardial infarction (NSTEMI) 06/12/2019   Hypertension 03/24/2019   Encounter for antineoplastic chemotherapy 01/22/2018   Adenocarcinoma of right lung, stage 4 ([place]) 01/04/2018   Goals of care, counseling/discussion 01/04/2018   Thyroid nodule 12/11/2017   Pleural effusion, right 12/11/2017   Insomnia 04/20/2014   PAST MEDICAL HISTORY:  Active Ambulatory Problems    Diagnosis Date Noted   Thyroid nodule 12/11/2017   Pleural effusion, right 12/11/2017   Adenocarcinoma of right lung, stage 4 ([place]) 01/04/2018   Goals of care, counseling/discussion 01/04/2018   Encounter for antineoplastic chemotherapy 01/22/2018   Hypertension 03/24/2019   History of non-ST elevation myocardial infarction (NSTEMI) 06/12/2019   Status post coronary artery stent placement    Abnormal liver function 08/20/2019   Gastroesophageal reflux disease without esophagitis 08/20/2019   Generalized anxiety disorder 08/20/2019   Glaucoma 08/20/2019   Hyperlipidemia 08/20/2019   Insomnia 04/20/2014   Menopausal syndrome 08/20/2019   Raynaud's phenomenon 08/20/2019   Vitamin D deficiency 08/20/2019   History of chest tube  placement 11/12/2019   Hx of chest tube placement 02/11/2020   NSCLC with EGFR mutation ([place]) 06/30/2020   Acute non-recurrent frontal sinusitis 11/18/2020   Statin myopathy 12/03/2020   Encounter to establish care 02/02/2021   Shortness of breath 02/02/2021   Cough 02/02/2021   Postobstructive pneumonia 02/07/2021   Pulmonary embolism ([facility]) 02/07/2021   Elevated troponin 02/07/2021   Leg DVT (deep venous thromboembolism), acute, bilateral ([place]) 02/08/2021   Aortic atherosclerosis ([place]) 02/08/2021   Respiratory failure ([place]) 02/08/2021   CVA (cerebral vascular accident) ([place]) 02/12/2021   Current use of long term anticoagulation 02/12/2021   Hypokalemia 02/12/2021   Coronary artery disease of native artery of native heart with stable angina pectoris ([place])    Epiretinal membrane, right eye 02/28/2021   Nuclear sclerotic cataract of right eye 02/28/2021   Nuclear sclerotic cataract of left eye 02/28/2021   Lung nodule 04/01/2021   Pneumothorax after biopsy 04/21/2021   Protein-calorie malnutrition, severe 04/25/2021   Port-A-Cath in place 07/07/2021   Resolved Ambulatory Problems    Diagnosis Date Noted   Mass of upper lobe of right lung 12/10/2016   Pulmonary nodules 12/11/2017   Hypokalemia    History of precordial chest pain 07/16/2019   Past Medical History:  Diagnosis Date   Anemia    Anxiety    Arthritis    Asthma    Depression    Dyspnea    GERD (gastroesophageal reflux disease)    History of radiation therapy 01/05/2021   lung ca dx'd 11/2017   Malignant pleural effusion    PONV (postoperative nausea and vomiting)    Pre-diabetes    Raynaud's disease    Raynaud's disease    Stroke ([facility]) 01/2021   SOCIAL HX:  Social History   Tobacco Use   Smoking status: Never   Smokeless tobacco: Never  Substance Use Topics   Alcohol use: Not Currently    Comment: up to 3 drinks per week     ALLERGIES:  Allergies  Allergen Reactions   Penicillins Other (See  Comments)    [facility] PATIENT HAS HAD A PCN REACTION WITH IMMEDIATE RASH, FACIAL/TONGUE/THROAT SWELLING, SOB, OR LIGHTHEADEDNESS WITH HYPOTENSION:  #  #  YES  #  # Has patient had a PCN reaction causing severe rash involving mucus membranes or skin necrosis: No Has patient had a PCN reaction that  required hospitalization: No Has patient had a PCN reaction occurring within the last 10 years: No    Vicodin [Hydrocodone-Acetaminophen] Other (See Comments)    Sped up heart and breathing   Other Other (See Comments)    Glaucoma eye drop - turned eyes dark     PERTINENT MEDICATIONS:  Outpatient Encounter Medications as of 09/15/2021  Medication Sig   acetaminophen (TYLENOL) 500 MG tablet Take 1,000 mg by mouth every 6 (six) hours as needed (pain).   ALPRAZolam (XANAX) 0.25 MG tablet Take 1 tablet (0.25 mg total) by mouth 3 (three) times daily as needed for anxiety. May take 2 tablets (0.5 mg total) at bedtime.   amLODipine (NORVASC) 5 MG tablet Take 5 mg by mouth daily.   aspirin 81 MG chewable tablet Chew 1 tablet (81 mg total) by mouth daily.   brompheniramine-pseudoephedrine-DM 30-2-10 MG/5ML syrup Take 5 mLs by mouth 3 (three) times daily as needed.   carvedilol (COREG) 12.5 MG tablet Take 1 tablet (12.5 mg total) by mouth 2 (two) times daily.   cetirizine (ZYRTEC) 5 MG tablet Take 5 mg by mouth daily.   chlorthalidone (HYGROTON) 25 MG tablet TAKE 1 TABLET (25 MG TOTAL) BY MOUTH DAILY.   dexamethasone (DECADRON) 4 MG tablet Take 1 tablet twice a day the day before, the day of, and the day after chemotherapy   dorzolamide-timolol (COSOPT) 22.3-6.8 MG/ML ophthalmic solution Place 1 drop into both eyes 2 (two) times daily.   dronabinol (MARINOL) 2.5 MG capsule Take 1 capsule (2.5 mg total) by mouth 2 (two) times daily before a meal.   enoxaparin (LOVENOX) 60 MG/0.6ML injection Inject 0.6 mLs (60 mg total) into the skin every 12 (twelve) hours.   esomeprazole (NEXIUM) 20 MG capsule Take 1 capsule  (20 mg total) by mouth in the morning and at bedtime.   ezetimibe (ZETIA) 10 MG tablet Take 10 mg by mouth daily.    FLUoxetine (PROZAC) 10 MG capsule Take 1 capsule (10 mg total) by mouth daily.   folic acid (FOLVITE) 1 MG tablet TAKE 1 TABLET BY MOUTH EVERY DAY   hyoscyamine (LEVSIN SL) 0.125 MG SL tablet Place 2 tablets (0.25 mg total) under the tongue every 6 (six) hours as needed for cramping. Take 1-2 tablets under the tongue every 6 hours as needed for cramping   ipratropium-albuterol (DUONEB) 0.5-2.5 (3) MG/3ML SOLN Take 3 mLs by nebulization every 6 (six) hours as needed (Asthma).   isosorbide mononitrate (IMDUR) 30 MG 24 hr tablet Take 1 tablet (30 mg total) by mouth daily.   latanoprost (XALATAN) 0.005 % ophthalmic solution Place 1 drop into both eyes at bedtime.    magnesium oxide (MAG-OX) 400 (240 Mg) MG tablet TAKE 1 TABLET BY MOUTH EVERY DAY   ondansetron (ZOFRAN-ODT) 4 MG disintegrating tablet Take 1 tablet (4 mg total) by mouth every 8 (eight) hours as needed for nausea or vomiting.   osimertinib mesylate (TAGRISSO) 80 MG tablet Take 1 tablet (80 mg total) by mouth daily.   potassium chloride SA (KLOR-CON M) 20 MEQ tablet Take 1 tablet (20 mEq total) by mouth daily.   prochlorperazine (COMPAZINE) 10 MG tablet Take 1 tablet (10 mg total) by mouth every 6 (six) hours as needed for nausea or vomiting.   REPATHA SURECLICK 476 MG/ML SOAJ Inject 140 mg into the skin every 14 (fourteen) days.   No facility-administered encounter medications on file as of 09/15/2021.   Thank you for the opportunity to participate in the care of  Ms. Bermea.  The palliative care team will continue to follow. Please call our office at [phone] if we can be of additional assistance.   [name], DO  COVID-19 PATIENT SCREENING TOOL Asked and negative response unless otherwise noted:  Have you had symptoms of covid, tested positive or been in contact with someone with symptoms/positive test in the  past 5-10 days? no

## 2021-09-22 ENCOUNTER — Encounter (INDEPENDENT_AMBULATORY_CARE_PROVIDER_SITE_OTHER): Payer: Self-pay | Admitting: Ophthalmology

## 2021-09-22 ENCOUNTER — Ambulatory Visit (INDEPENDENT_AMBULATORY_CARE_PROVIDER_SITE_OTHER): Payer: Medicare HMO | Admitting: Ophthalmology

## 2021-09-22 ENCOUNTER — Other Ambulatory Visit: Payer: Self-pay | Admitting: Physician Assistant

## 2021-09-22 DIAGNOSIS — E876 Hypokalemia: Secondary | ICD-10-CM

## 2021-09-22 DIAGNOSIS — Z961 Presence of intraocular lens: Secondary | ICD-10-CM

## 2021-09-22 DIAGNOSIS — H2512 Age-related nuclear cataract, left eye: Secondary | ICD-10-CM

## 2021-09-22 DIAGNOSIS — H35371 Puckering of macula, right eye: Secondary | ICD-10-CM | POA: Diagnosis not present

## 2021-09-22 DIAGNOSIS — H2511 Age-related nuclear cataract, right eye: Secondary | ICD-10-CM | POA: Diagnosis not present

## 2021-09-22 NOTE — Assessment & Plan Note (Addendum)
Follow-up Dr. Midge Aver as scheduled and if and when as needed cataract surgery OS ?

## 2021-09-22 NOTE — Patient Instructions (Signed)
The nature of macular pucker (epiretinal membrane ERM) was discussed with the patient as well as threshold criteria for vitrectomy surgery. I explained that in rare cases another surgery is needed to actually remove a second wrinkle should it regrow.  Most often, the epiretinal membrane and underlying wrinkled internal limiting membrane are removed with the first surgery, to accomplish the goals.   If the operative eye is Phakic (natural lens still present), cataract surgery is often recommended prior to Vitrectomy. This will enable the retina surgeon to have the best view during surgery and the patient to obtain optimal results in the future. Treatment options were discussed. ? ?I have recommended at home monitoring the near vision task in a monocular (1 eye at a time), with or without near vision glasses, to look for changes or declines in reading.  ? ?Macular Pucker ?A macular pucker is scarring on the macula of the eye. The macula is the part of the eye that lets you see clearly and sharply. It also lets you see detail. The macula is in the middle of the thin membrane that covers the back of your eye (retina). ?A macular pucker usually starts in one eye but can affect both eyes over time. This condition can affect your vision. Vision changes can range from mild to severe. These vision changes may get worse over time. ?What are the causes? ?This condition is caused by the shrinking of the fluid that fills your eye. This jelly-like fluid is called vitreous. The vitreous may shrink as you get older. As it shrinks, it pulls on your macula and forms scar tissue. ?What increases the risk? ?You are more likely to develop this condition if: ?You are age 71 or older. ?You have a disease of the retina (retinopathy). ?You have or have had an injury or trauma to the eye. ?You have nearsightedness (myopia). ?You have a tearing or detachment of the retina. ?You have inflammation of the eye (uveitis). ?You have diabetes. ?What  are the signs or symptoms? ?Symptoms of this condition include: ?Blurred vision. You may have trouble reading small print or seeing fine details. ?Distorted vision. Straight lines may appear wavy. ?A gray area or blind spot in the center of your vision. ?A mild pucker may cause no symptoms. ?How is this diagnosed? ?This condition may be diagnosed based on: ?You medical history. ?A physical exam. ?An exam by a health care provider who specializes in conditions and diseases of the eye (ophthalmologist). The exam may include: ?Checking the back of your eye with a microscope. ?Imaging of the back of your eye. ?Injecting dye in your vein and then getting special photographs of the back of your eye. ?Taking color pictures of the back of your eye. ?How is this treated? ?Treatment for this condition depends on the severity of the symptoms. Your eye care provider may change your prescription if you wear eyeglasses to help you see better. ?If treatment is needed, surgery is the only treatment for macular pucker. ?This surgery (vitrectomy) is done only when macular pucker causes vision changes that interfere with your daily activities. ?In this procedure, a surgeon removes any scar tissue and replaces your vitreous with a saltwater solution. ?Follow these instructions at home: ?Be aware of any changes in your vision. ?Keep all follow-up visits. This is important. ?Contact a health care provider if: ?You have any new or worsening vision changes. ?Get help right away if: ?You lose your vision completely. ?You develop a sudden increase in the number  of floaters you see. ?You develop flashes along with floaters. ?It looks as if a curtain is blocking part of your vision. ?These symptoms may be an emergency. Get help right away. Call 911. ?Do not wait to see if the symptoms will go away. ?Do not drive yourself to the hospital. ?Summary ?A macular pucker is scarring on the macula of the eye. ?This condition is caused by shrinking of  the gel-like fluid that fills your eye. ?A macular pucker can affect your vision. You may have trouble reading small print or seeing fine details. ?Treatment is not needed unless the condition interferes with your daily activities. ?This information is not intended to replace advice given to you by your health care provider. Make sure you discuss any questions you have with your health care provider. ?Document Revised: 12/22/2020 Document Reviewed: 12/22/2020 ?Elsevier Patient Education ? [place]. ? ?

## 2021-09-22 NOTE — Assessment & Plan Note (Signed)
Looks great well centered IOL ?

## 2021-09-22 NOTE — Assessment & Plan Note (Addendum)
Progressive epiretinal membrane with macular thickening.  With distorted vision and and significant impairment acuity per patient symptoms as reported today despite seeing irregular 20/20 acuity on the eye chart. ? ? ?The nature of macular pucker (epiretinal membrane ERM) was discussed with the patient as well as threshold criteria for vitrectomy surgery. I explained that in rare cases another surgery is needed to actually remove a second wrinkle should it regrow.  Most often, the epiretinal membrane and underlying wrinkled internal limiting membrane are removed with the first surgery, to accomplish the goals.   If the operative eye is Phakic (natural lens still present), cataract surgery is often recommended prior to Vitrectomy. This will enable the retina surgeon to have the best view during surgery and the patient to obtain optimal results in the future. Treatment options were discussed. ? ?I have recommended at home monitoring the near vision task in a monocular (1 eye at a time), with or without near vision glasses, to look for changes or declines in reading. ?

## 2021-09-22 NOTE — Progress Notes (Signed)
? ? ?09/22/2021 ? ?  ? ?CHIEF COMPLAINT ?Patient presents for  ?Chief Complaint  ?Patient presents with  ? 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 ? ? ? ?HISTORY OF PRESENT ILLNESS: ?ANAIZ QAZI is a 71 y.o. female who presents to the clinic today for:  ? ?HPI   ?6 mos fu ou oct- missed appnt in march--WIP ref by Dr. Katy Fitch. ?Pt stated she sees a little better after the cataract surgery. Pt states vision is a little distorted and images are not as clear. Pt had cataract surgery beginning of 2023 performed by Dr. Midge Aver. ?Pt is still taking Latanoprost 1 drop in both eyes at bedtime ?DORZ-TIMOLOL- 1 drops into both eyes 2x daily. ?Pt is in chemo once every 3 weeks. ? ? ?Last edited by Silvestre Moment on 09/22/2021  2:32 PM.  ?  ? ? ?Referring physician: ?Ronnell Freshwater, NP ?[address] ?[place],  [place] [id number] ? ?HISTORICAL INFORMATION:  ? ?Selected notes from the [facility] ?  ? ?Lab Results  ?Component Value Date  ? HGBA1C 5.7 (H) 02/12/2021  ?  ? ?CURRENT MEDICATIONS: ?Current Outpatient Medications (Ophthalmic Drugs)  ?Medication Sig  ? dorzolamide-timolol (COSOPT) 22.3-6.8 MG/ML ophthalmic solution Place 1 drop into both eyes 2 (two) times daily.  ? latanoprost (XALATAN) 0.005 % ophthalmic solution Place 1 drop into both eyes at bedtime.   ? ?No current facility-administered medications for this visit. (Ophthalmic Drugs)  ? ?Current Outpatient Medications (Other)  ?Medication Sig  ? acetaminophen (TYLENOL) 500 MG tablet Take 1,000 mg by mouth every 6 (six) hours as needed (pain).  ? ALPRAZolam (XANAX) 0.25 MG tablet Take 1 tablet (0.25 mg total) by mouth 3 (three) times daily as needed for anxiety. May take 2 tablets (0.5 mg total) at bedtime.  ? aspirin 81 MG chewable tablet Chew 1 tablet (81 mg total) by mouth daily.  ? brompheniramine-pseudoephedrine-DM 30-2-10 MG/5ML syrup Take 5 mLs by mouth 3 (three) times daily as needed.  ? carvedilol (COREG) 12.5 MG tablet  Take 1 tablet (12.5 mg total) by mouth 2 (two) times daily.  ? cetirizine (ZYRTEC) 5 MG tablet Take 5 mg by mouth daily.  ? dexamethasone (DECADRON) 4 MG tablet Take 1 tablet twice a day the day before, the day of, and the day after chemotherapy  ? dronabinol (MARINOL) 2.5 MG capsule Take 1 capsule (2.5 mg total) by mouth 2 (two) times daily as needed (decreased appetite).  ? enoxaparin (LOVENOX) 60 MG/0.6ML injection Inject 0.6 mLs (60 mg total) into the skin every 12 (twelve) hours.  ? esomeprazole (NEXIUM) 20 MG capsule Take 1 capsule (20 mg total) by mouth in the morning and at bedtime.  ? FLUoxetine (PROZAC) 10 MG capsule Take 1 capsule (10 mg total) by mouth daily.  ? folic acid (FOLVITE) 1 MG tablet TAKE 1 TABLET BY MOUTH EVERY DAY  ? hyoscyamine (LEVSIN SL) 0.125 MG SL tablet Place 2 tablets (0.25 mg total) under the tongue every 6 (six) hours as needed for cramping. Take 1-2 tablets under the tongue every 6 hours as needed for cramping  ? ipratropium-albuterol (DUONEB) 0.5-2.5 (3) MG/3ML SOLN Take 3 mLs by nebulization every 6 (six) hours as needed (Asthma).  ? magnesium oxide (MAG-OX) 400 (240 Mg) MG tablet TAKE 1 TABLET BY MOUTH EVERY DAY  ? ondansetron (ZOFRAN-ODT) 4 MG disintegrating tablet Take 1 tablet (4 mg total) by mouth every 8 (eight) hours as needed for nausea or vomiting.  ?  osimertinib mesylate (TAGRISSO) 80 MG tablet Take 1 tablet (80 mg total) by mouth daily.  ? potassium chloride SA (KLOR-CON M) 20 MEQ tablet Take 1 tablet (20 mEq total) by mouth daily.  ? REPATHA SURECLICK 017 MG/ML SOAJ Inject 140 mg into the skin every 14 (fourteen) days.  ? ?No current facility-administered medications for this visit. (Other)  ? ? ? ? ?REVIEW OF SYSTEMS: ?ROS   ?Negative for: Constitutional, Gastrointestinal, Neurological, Skin, Genitourinary, Musculoskeletal, HENT, Endocrine, Cardiovascular, Eyes, Respiratory, Psychiatric, Allergic/Imm, Heme/Lymph ?Last edited by Silvestre Moment on 09/22/2021  2:32 PM.  ?   ? ? ? ?ALLERGIES ?Allergies  ?Allergen Reactions  ? Penicillins Other (See Comments)  ?  SYNCOPE ?PATIENT HAS HAD A PCN REACTION WITH IMMEDIATE RASH, FACIAL/TONGUE/THROAT SWELLING, SOB, OR LIGHTHEADEDNESS WITH HYPOTENSION:  #  #  YES  #  # ?Has patient had a PCN reaction causing severe rash involving mucus membranes or skin necrosis: No ?Has patient had a PCN reaction that required hospitalization: No ?Has patient had a PCN reaction occurring within the last 10 years: No ?  ? Vicodin [Hydrocodone-Acetaminophen] Other (See Comments)  ?  Sped up heart and breathing  ? Other Other (See Comments)  ?  Glaucoma eye drop - turned eyes dark  ? ? ?PAST MEDICAL HISTORY ?Past Medical History:  ?Diagnosis Date  ? Anemia   ? Anxiety   ? Arthritis   ? Asthma   ? exercise induced  ? Depression   ? PMH  ? Dyspnea   ? GERD (gastroesophageal reflux disease)   ? Glaucoma   ? History of radiation therapy 01/05/2021  ? IMRT right lung  11/24/2020-01/05/2021  Dr [name]  ? Hypertension   ? lung ca dx'd 11/2017  ? right  ? Malignant pleural effusion   ? right  ? Nuclear sclerotic cataract of right eye 02/28/2021  ? Dr. Warden Fillers, cataract surgery February 2023  ? PONV (postoperative nausea and vomiting)   ? Pre-diabetes   ? Raynaud's disease   ? Raynaud's disease   ? Stroke [facility]) 01/2021  ? balance off, some express aphasia, weakness  ? ?Past Surgical History:  ?Procedure Laterality Date  ? ABDOMINAL HYSTERECTOMY    ? partial  ? BRONCHIAL BIOPSY  04/21/2021  ? Procedure: BRONCHIAL BIOPSIES;  Surgeon: [name], DO;  Location: [facility];  Service: Pulmonary;;  ? BRONCHIAL BRUSHINGS  04/21/2021  ? Procedure: BRONCHIAL BRUSHINGS;  Surgeon: [name], DO;  Location: [facility];  Service: Pulmonary;;  ? BRONCHIAL NEEDLE ASPIRATION BIOPSY  04/21/2021  ? Procedure: BRONCHIAL NEEDLE ASPIRATION BIOPSIES;  Surgeon: [name], DO;  Location: [facility];  Service: Pulmonary;;  ? CHEST TUBE INSERTION Right 01/01/2018   ? Procedure: INSERTION PLEURAL DRAINAGE CATHETER;  Surgeon: Ivin Poot, MD;  Location: [facility];  Service: Thoracic;  Laterality: Right;  ? CHEST TUBE INSERTION  04/21/2021  ? Procedure: CHEST TUBE INSERTION;  Surgeon: [name], DO;  Location: [facility];  Service: Pulmonary;;  ? COLONOSCOPY    ? CORONARY STENT INTERVENTION N/A 06/16/2019  ? Procedure: CORONARY STENT INTERVENTION;  Surgeon: Jettie Booze, MD;  Location: [facility];  Service: Cardiovascular;  Laterality: N/A;  ? DILATION AND CURETTAGE OF UTERUS    ? EYE SURGERY    ? due to Glaucoma  ? IR IMAGING GUIDED PORT INSERTION  03/22/2021  ? IR PORT REPAIR CENTRAL VENOUS ACCESS DEVICE  04/15/2021  ? LEFT HEART CATH AND CORONARY ANGIOGRAPHY N/A  06/16/2019  ? Procedure: LEFT HEART CATH AND CORONARY ANGIOGRAPHY;  Surgeon: Jettie Booze, MD;  Location: [facility];  Service: Cardiovascular;  Laterality: N/A;  ? REMOVAL OF PLEURAL DRAINAGE CATHETER Right 11/07/2018  ? Procedure: REMOVAL OF PLEURAL DRAINAGE CATHETER;  Surgeon: Ivin Poot, MD;  Location: [facility];  Service: Thoracic;  Laterality: Right;  ? ROTATOR CUFF REPAIR    ? TUBAL LIGATION    ? VIDEO BRONCHOSCOPY WITH ENDOBRONCHIAL NAVIGATION Bilateral 04/21/2021  ? Procedure: VIDEO BRONCHOSCOPY WITH ENDOBRONCHIAL NAVIGATION;  Surgeon: [name], DO;  Location: [facility];  Service: Pulmonary;  Laterality: Bilateral;  ION  ? WISDOM TOOTH EXTRACTION    ? ? ?FAMILY HISTORY ?Family History  ?Problem Relation Age of Onset  ? Heart disease Sister   ? Heart disease Brother   ? Lung cancer Other   ? ? ?SOCIAL HISTORY ?Social History  ? ?Tobacco Use  ? Smoking status: Never  ? Smokeless tobacco: Never  ?Vaping Use  ? Vaping Use: Never used  ?Substance Use Topics  ? Alcohol use: Not Currently  ?  Comment: up to 3 drinks per week  ? Drug use: No  ?  Comment: CBD oil   ? ?  ? ?  ? ?OPHTHALMIC EXAM: ? ?Base Eye Exam   ? ? Visual Acuity (ETDRS)   ? ?   Right Left  ? Dist cc  20/20 -1 20/20  ? ? Correction: Glasses  ? ?  ?  ? ? Tonometry (Tonopen, 2:38 PM)   ? ?   Right Left  ? Pressure 14 16  ? ?  ?  ? ? Pupils   ? ?   Pupils Dark Light Shape React APD  ? Right PERRL 4 3 Round Bri

## 2021-09-26 ENCOUNTER — Other Ambulatory Visit: Payer: Self-pay | Admitting: Internal Medicine

## 2021-09-27 ENCOUNTER — Other Ambulatory Visit: Payer: Self-pay

## 2021-09-27 DIAGNOSIS — R1013 Epigastric pain: Secondary | ICD-10-CM

## 2021-09-27 MED ORDER — ESOMEPRAZOLE MAGNESIUM 20 MG PO CPDR: 20.0000 mg | DELAYED_RELEASE_CAPSULE | Freq: Two times a day (BID) | ORAL | 2 refills | Status: DC

## 2021-09-29 ENCOUNTER — Other Ambulatory Visit: Payer: Self-pay | Admitting: Internal Medicine

## 2021-09-29 ENCOUNTER — Inpatient Hospital Stay (HOSPITAL_BASED_OUTPATIENT_CLINIC_OR_DEPARTMENT_OTHER): Payer: Medicare HMO | Admitting: Internal Medicine

## 2021-09-29 ENCOUNTER — Other Ambulatory Visit: Payer: Self-pay

## 2021-09-29 ENCOUNTER — Other Ambulatory Visit: Payer: Self-pay | Admitting: Physician Assistant

## 2021-09-29 ENCOUNTER — Inpatient Hospital Stay: Payer: Medicare HMO

## 2021-09-29 ENCOUNTER — Inpatient Hospital Stay: Payer: Medicare HMO | Attending: Internal Medicine

## 2021-09-29 ENCOUNTER — Inpatient Hospital Stay: Payer: Medicare HMO | Admitting: Nutrition

## 2021-09-29 VITALS — BP 127/73 | HR 74 | Temp 96.1°F | Resp 17 | Ht 64.0 in | Wt 119.0 lb

## 2021-09-29 DIAGNOSIS — C3491 Malignant neoplasm of unspecified part of right bronchus or lung: Secondary | ICD-10-CM

## 2021-09-29 DIAGNOSIS — Z7982 Long term (current) use of aspirin: Secondary | ICD-10-CM | POA: Diagnosis not present

## 2021-09-29 DIAGNOSIS — J910 Malignant pleural effusion: Secondary | ICD-10-CM | POA: Diagnosis not present

## 2021-09-29 DIAGNOSIS — D6481 Anemia due to antineoplastic chemotherapy: Secondary | ICD-10-CM | POA: Insufficient documentation

## 2021-09-29 DIAGNOSIS — Z5111 Encounter for antineoplastic chemotherapy: Secondary | ICD-10-CM | POA: Diagnosis not present

## 2021-09-29 DIAGNOSIS — C3490 Malignant neoplasm of unspecified part of unspecified bronchus or lung: Secondary | ICD-10-CM | POA: Diagnosis not present

## 2021-09-29 DIAGNOSIS — D701 Agranulocytosis secondary to cancer chemotherapy: Secondary | ICD-10-CM | POA: Insufficient documentation

## 2021-09-29 DIAGNOSIS — C3411 Malignant neoplasm of upper lobe, right bronchus or lung: Secondary | ICD-10-CM | POA: Diagnosis present

## 2021-09-29 DIAGNOSIS — T451X5A Adverse effect of antineoplastic and immunosuppressive drugs, initial encounter: Secondary | ICD-10-CM | POA: Diagnosis not present

## 2021-09-29 DIAGNOSIS — Z79899 Other long term (current) drug therapy: Secondary | ICD-10-CM | POA: Insufficient documentation

## 2021-09-29 DIAGNOSIS — Z95828 Presence of other vascular implants and grafts: Secondary | ICD-10-CM

## 2021-09-29 LAB — MAGNESIUM: Magnesium: 1.7 mg/dL (ref 1.7–2.4)

## 2021-09-29 LAB — CMP (CANCER CENTER ONLY)
ALT: 19 U/L (ref 0–44)
AST: 27 U/L (ref 15–41)
Albumin: 3.7 g/dL (ref 3.5–5.0)
Alkaline Phosphatase: 64 U/L (ref 38–126)
Anion gap: 6 (ref 5–15)
BUN: 16 mg/dL (ref 8–23)
CO2: 26 mmol/L (ref 22–32)
Calcium: 9.3 mg/dL (ref 8.9–10.3)
Chloride: 106 mmol/L (ref 98–111)
Creatinine: 1.09 mg/dL — ABNORMAL HIGH (ref 0.44–1.00)
GFR, Estimated: 55 mL/min — ABNORMAL LOW (ref >60)
Glucose, Bld: 158 mg/dL — ABNORMAL HIGH (ref 70–99)
Potassium: 3.5 mmol/L (ref 3.5–5.1)
Sodium: 138 mmol/L (ref 135–145)
Total Bilirubin: 0.3 mg/dL (ref 0.3–1.2)
Total Protein: 6.9 g/dL (ref 6.5–8.1)

## 2021-09-29 LAB — CBC WITH DIFFERENTIAL (CANCER CENTER ONLY)
Abs Immature Granulocytes: 0.01 10*3/uL (ref 0.00–0.07)
Basophils Absolute: 0 10*3/uL (ref 0.0–0.1)
Basophils Relative: 0 %
Eosinophils Absolute: 0 10*3/uL (ref 0.0–0.5)
Eosinophils Relative: 0 %
HCT: 27.5 % — ABNORMAL LOW (ref 36.0–46.0)
Hemoglobin: 8.6 g/dL — ABNORMAL LOW (ref 12.0–15.0)
Immature Granulocytes: 0 %
Lymphocytes Relative: 16 %
Lymphs Abs: 0.4 10*3/uL — ABNORMAL LOW (ref 0.7–4.0)
MCH: 29 pg (ref 26.0–34.0)
MCHC: 31.3 g/dL (ref 30.0–36.0)
MCV: 92.6 fL (ref 80.0–100.0)
Monocytes Absolute: 0.2 10*3/uL (ref 0.1–1.0)
Monocytes Relative: 6 %
Neutro Abs: 2 10*3/uL (ref 1.7–7.7)
Neutrophils Relative %: 78 %
Platelet Count: 207 10*3/uL (ref 150–400)
RBC: 2.97 MIL/uL — ABNORMAL LOW (ref 3.87–5.11)
RDW: 15.3 % (ref 11.5–15.5)
WBC Count: 2.6 10*3/uL — ABNORMAL LOW (ref 4.0–10.5)
nRBC: 0 % (ref 0.0–0.2)

## 2021-09-29 MED ORDER — SODIUM CHLORIDE 0.9% FLUSH
10.0000 mL | INTRAVENOUS | Status: DC | PRN
  Administered 2021-09-29: 10 mL

## 2021-09-29 MED ORDER — SODIUM CHLORIDE 0.9 % IV SOLN
400.0000 mg/m2 | Freq: Once | INTRAVENOUS | Status: AC
  Administered 2021-09-29: 600 mg via INTRAVENOUS
  Filled 2021-09-29: qty 20

## 2021-09-29 MED ORDER — SODIUM CHLORIDE 0.9% FLUSH
10.0000 mL | Freq: Once | INTRAVENOUS | Status: AC
  Administered 2021-09-29: 10 mL

## 2021-09-29 MED ORDER — SODIUM CHLORIDE 0.9 % IV SOLN: Freq: Once | INTRAVENOUS | Status: AC

## 2021-09-29 MED ORDER — HEPARIN SOD (PORK) LOCK FLUSH 100 UNIT/ML IV SOLN
500.0000 [IU] | Freq: Once | INTRAVENOUS | Status: AC | PRN
  Administered 2021-09-29: 500 [IU]

## 2021-09-29 MED ORDER — FLUOXETINE HCL 10 MG PO CAPS: 10.0000 mg | ORAL_CAPSULE | Freq: Every day | ORAL | 3 refills | Status: DC

## 2021-09-29 MED ORDER — ONDANSETRON HCL 4 MG/2ML IJ SOLN
8.0000 mg | Freq: Once | INTRAMUSCULAR | Status: AC
  Administered 2021-09-29: 8 mg via INTRAVENOUS
  Filled 2021-09-29: qty 4

## 2021-09-29 NOTE — Progress Notes (Signed)
Nutrition follow-up completed with patient during infusion for stage IV non-small cell lung cancer. ?Weight improved and documented as 119 pounds on April 13.  This is improved from 113.2 pounds March 2. ?Noted labs: Glucose 158 and creatinine 1.09. ?Patient reports her nausea has resolved. ?She is eating more even though she has not been taking Marinol. ?She has not been drinking Ensure.  Reports when she thinks about drinking Ensure she decided she would rather eat more. ?No nutrition concerns identified today. ? ?Nutrition diagnosis: Unintended weight loss improved. ? ?Intervention: ?Continue strategies for increased oral intake to minimize weight loss. ?Strive for frequent small meals/snacks throughout the day. ? ?Monitoring, evaluation, goals: ?Patient will continue to work to tolerate adequate calories and protein for weight maintenance/weight gain. ? ?Next visit: ?To be scheduled as needed. ? ?**Disclaimer: This note was dictated with voice recognition software. Similar sounding words can inadvertently be transcribed and this note may contain transcription errors which may not have been corrected upon publication of note.** ? ?

## 2021-09-29 NOTE — Progress Notes (Signed)
?    [facility] ?[phone]   [phone] ? ?OFFICE PROGRESS NOTE ? ?[name], MD ?[place] ?[place] [address] ? ?DIAGNOSIS: Stage IV (T2 a,N2, M1a) non-small cell lung cancer, adenocarcinoma diagnosed in July 2019 and presented with right upper lobe lung mass in addition to mediastinal lymphadenopathy as well as bilateral pulmonary nodules and malignant right pleural effusion. ?  ?Biomarker Findings ?Microsatellite status - Cannot Be Determined ?Tumor Mutational Burden - Cannot Be Determined ?Genomic Findings ?For a complete list of the genes assayed, please refer to the Appendix. ?EGFR exon 19 deletion ([id number]>T) ?TP53 Y220C ?7 Disease relevant genes with no reportable alterations: KRAS, ALK, ?BRAF, MET, RET, ERBB2, ROS1  ?  ?PRIOR THERAPY:  ?1) Status post right Pleurx catheter placement by Dr. Prescott Gum for drainage of malignant right pleural effusion. ?2) palliative radiotherapy to the enlarging right upper lobe lung mass and mediastinum under the care of Dr. Sondra Come expected to be completed on January 05, 2021. ?3) Tagrisso 80 mg p.o. daily.  First dose was given on 01/29/2018.  Status post 39 months of treatment. ?  ?CURRENT THERAPY: Systemic chemotherapy with carboplatin for AUC of 5 and Alimta 500 Mg/M2 every 3 weeks.  First dose 03/17/2021.  The patient will also continue her current treatment with Tagrisso 80 mg p.o. daily.  She is status post 9 cycles. Starting from cycle #7, she will be on Alimta only 500 mg/m2. ? ?INTERVAL HISTORY: ?Allison [name] 71 y.o. female returns to the clinic today for follow-up visit.  The patient is feeling fine today with no concerning complaints except for occasional pain on the right side of the chest the vibrating vest that was prescribed by Dr. Valeta Harms to help her sputum.  The patient happens in the area where the Port-A-Cath is located.  The patient denied having any shortness of breath except with exertion  with mild cough and no hemoptysis.  She denied having any fever or chills.  She has no nausea, vomiting, diarrhea or constipation.  She continues to have mild fatigue.  She is here today for evaluation before restarting cycle #10 of her treatment with Alimta. ? ? ?MEDICAL HISTORY: ?Past Medical History:  ?Diagnosis Date  ? Anemia   ? Anxiety   ? Arthritis   ? Asthma   ? exercise induced  ? Depression   ? PMH  ? Dyspnea   ? GERD (gastroesophageal reflux disease)   ? Glaucoma   ? History of radiation therapy 01/05/2021  ? IMRT right lung  11/24/2020-01/05/2021  Dr [name]  ? Hypertension   ? lung ca dx'd 11/2017  ? right  ? Malignant pleural effusion   ? right  ? Nuclear sclerotic cataract of right eye 02/28/2021  ? Dr. Warden Fillers, cataract surgery February 2023  ? PONV (postoperative nausea and vomiting)   ? Pre-diabetes   ? Raynaud's disease   ? Raynaud's disease   ? Stroke [facility]) 01/2021  ? balance off, some express aphasia, weakness  ? ? ?ALLERGIES:  is allergic to penicillins, vicodin [hydrocodone-acetaminophen], and other. ? ?MEDICATIONS:  ?Current Outpatient Medications  ?Medication Sig Dispense Refill  ? acetaminophen (TYLENOL) 500 MG tablet Take 1,000 mg by mouth every 6 (six) hours as needed (pain).    ? ALPRAZolam (XANAX) 0.25 MG tablet Take 1 tablet (0.25 mg total) by mouth 3 (three) times daily as needed for anxiety. May take 2 tablets (0.5 mg total) at bedtime. 60 tablet 0  ?  aspirin 81 MG chewable tablet Chew 1 tablet (81 mg total) by mouth daily.    ? brompheniramine-pseudoephedrine-DM 30-2-10 MG/5ML syrup Take 5 mLs by mouth 3 (three) times daily as needed. 120 mL 0  ? carvedilol (COREG) 12.5 MG tablet Take 1 tablet (12.5 mg total) by mouth 2 (two) times daily. 180 tablet 1  ? cetirizine (ZYRTEC) 5 MG tablet Take 5 mg by mouth daily.    ? dexamethasone (DECADRON) 4 MG tablet Take 1 tablet twice a day the day before, the day of, and the day after chemotherapy 40 tablet 2  ? dorzolamide-timolol  (COSOPT) 22.3-6.8 MG/ML ophthalmic solution Place 1 drop into both eyes 2 (two) times daily.    ? dronabinol (MARINOL) 2.5 MG capsule Take 1 capsule (2.5 mg total) by mouth 2 (two) times daily as needed (decreased appetite). 60 capsule 0  ? enoxaparin (LOVENOX) 60 MG/0.6ML injection Inject 0.6 mLs (60 mg total) into the skin every 12 (twelve) hours. 60 mL 1  ? esomeprazole (NEXIUM) 20 MG capsule Take 1 capsule (20 mg total) by mouth in the morning and at bedtime. 60 capsule 2  ? FLUoxetine (PROZAC) 10 MG capsule Take 1 capsule (10 mg total) by mouth daily. 30 capsule 3  ? folic acid (FOLVITE) 1 MG tablet TAKE 1 TABLET BY MOUTH EVERY DAY 90 tablet 1  ? hyoscyamine (LEVSIN SL) 0.125 MG SL tablet Place 2 tablets (0.25 mg total) under the tongue every 6 (six) hours as needed for cramping. Take 1-2 tablets under the tongue every 6 hours as needed for cramping 30 tablet 0  ? ipratropium-albuterol (DUONEB) 0.5-2.5 (3) MG/3ML SOLN Take 3 mLs by nebulization every 6 (six) hours as needed (Asthma).    ? latanoprost (XALATAN) 0.005 % ophthalmic solution Place 1 drop into both eyes at bedtime.     ? magnesium oxide (MAG-OX) 400 (240 Mg) MG tablet TAKE 1 TABLET BY MOUTH EVERY DAY 30 tablet 0  ? ondansetron (ZOFRAN-ODT) 4 MG disintegrating tablet Take 1 tablet (4 mg total) by mouth every 8 (eight) hours as needed for nausea or vomiting. 30 tablet 1  ? osimertinib mesylate (TAGRISSO) 80 MG tablet Take 1 tablet (80 mg total) by mouth daily. 30 tablet 3  ? potassium chloride SA (KLOR-CON M) 20 MEQ tablet Take 1 tablet (20 mEq total) by mouth daily. 5 tablet 0  ? REPATHA SURECLICK 366 MG/ML SOAJ Inject 140 mg into the skin every 14 (fourteen) days. 6 mL 3  ? ?No current facility-administered medications for this visit.  ? ? ?SURGICAL HISTORY:  ?Past Surgical History:  ?Procedure Laterality Date  ? ABDOMINAL HYSTERECTOMY    ? partial  ? BRONCHIAL BIOPSY  04/21/2021  ? Procedure: BRONCHIAL BIOPSIES;  Surgeon: [name], DO;   Location: [facility];  Service: Pulmonary;;  ? BRONCHIAL BRUSHINGS  04/21/2021  ? Procedure: BRONCHIAL BRUSHINGS;  Surgeon: [name], DO;  Location: [facility];  Service: Pulmonary;;  ? BRONCHIAL NEEDLE ASPIRATION BIOPSY  04/21/2021  ? Procedure: BRONCHIAL NEEDLE ASPIRATION BIOPSIES;  Surgeon: [name], DO;  Location: [facility];  Service: Pulmonary;;  ? CHEST TUBE INSERTION Right 01/01/2018  ? Procedure: INSERTION PLEURAL DRAINAGE CATHETER;  Surgeon: Ivin Poot, MD;  Location: [facility];  Service: Thoracic;  Laterality: Right;  ? CHEST TUBE INSERTION  04/21/2021  ? Procedure: CHEST TUBE INSERTION;  Surgeon: [name], DO;  Location: [facility];  Service: Pulmonary;;  ? COLONOSCOPY    ? CORONARY STENT INTERVENTION N/A  06/16/2019  ? Procedure: CORONARY STENT INTERVENTION;  Surgeon: Jettie Booze, MD;  Location: [facility];  Service: Cardiovascular;  Laterality: N/A;  ? DILATION AND CURETTAGE OF UTERUS    ? EYE SURGERY    ? due to Glaucoma  ? IR IMAGING GUIDED PORT INSERTION  03/22/2021  ? IR PORT REPAIR CENTRAL VENOUS ACCESS DEVICE  04/15/2021  ? LEFT HEART CATH AND CORONARY ANGIOGRAPHY N/A 06/16/2019  ? Procedure: LEFT HEART CATH AND CORONARY ANGIOGRAPHY;  Surgeon: Jettie Booze, MD;  Location: [facility];  Service: Cardiovascular;  Laterality: N/A;  ? REMOVAL OF PLEURAL DRAINAGE CATHETER Right 11/07/2018  ? Procedure: REMOVAL OF PLEURAL DRAINAGE CATHETER;  Surgeon: Ivin Poot, MD;  Location: [facility];  Service: Thoracic;  Laterality: Right;  ? ROTATOR CUFF REPAIR    ? TUBAL LIGATION    ? VIDEO BRONCHOSCOPY WITH ENDOBRONCHIAL NAVIGATION Bilateral 04/21/2021  ? Procedure: VIDEO BRONCHOSCOPY WITH ENDOBRONCHIAL NAVIGATION;  Surgeon: [name], DO;  Location: [facility];  Service: Pulmonary;  Laterality: Bilateral;  ION  ? WISDOM TOOTH EXTRACTION    ? ? ?REVIEW OF SYSTEMS:  A comprehensive review of systems was negative except for: Constitutional:  positive for fatigue ?Respiratory: positive for cough, dyspnea on exertion, and pleurisy/chest pain  ? ?PHYSICAL EXAMINATION: General appearance: alert, cooperative, appears stated age, and fatigued ?Head: Norm

## 2021-09-29 NOTE — Patient Instructions (Signed)
[facility]  Discharge Instructions: ?Thank you for choosing [facility] to provide your oncology and hematology care.  ? ?If you have a lab appointment with the [facility], please go directly to the [facility] and check in at the registration area. ?  ?Wear comfortable clothing and clothing appropriate for easy access to any Portacath or PICC line.  ? ?We strive to give you quality time with your provider. You may need to reschedule your appointment if you arrive late (15 or more minutes).  Arriving late affects you and other patients whose appointments are after yours.  Also, if you miss three or more appointments without notifying the office, you may be dismissed from the clinic at the provider?s discretion.    ?  ?For prescription refill requests, have your pharmacy contact our office and allow 72 hours for refills to be completed.   ? ?Today you received the following chemotherapy and/or immunotherapy agents: Alimta. ?  ?To help prevent nausea and vomiting after your treatment, we encourage you to take your nausea medication as directed. ? ?BELOW ARE SYMPTOMS THAT SHOULD BE REPORTED IMMEDIATELY: ?*FEVER GREATER THAN 100.4 F (38 ?C) OR HIGHER ?*CHILLS OR SWEATING ?*NAUSEA AND VOMITING THAT IS NOT CONTROLLED WITH YOUR NAUSEA MEDICATION ?*UNUSUAL SHORTNESS OF BREATH ?*UNUSUAL BRUISING OR BLEEDING ?*URINARY PROBLEMS (pain or burning when urinating, or frequent urination) ?*BOWEL PROBLEMS (unusual diarrhea, constipation, pain near the anus) ?TENDERNESS IN MOUTH AND THROAT WITH OR WITHOUT PRESENCE OF ULCERS (sore throat, sores in mouth, or a toothache) ?UNUSUAL RASH, SWELLING OR PAIN  ?UNUSUAL VAGINAL DISCHARGE OR ITCHING  ? ?Items with * indicate a potential emergency and should be followed up as soon as possible or go to the Emergency Department if any problems should occur. ? ?Please show the CHEMOTHERAPY ALERT CARD or IMMUNOTHERAPY ALERT CARD at check-in to the  Emergency Department and triage nurse. ? ?Should you have questions after your visit or need to cancel or reschedule your appointment, please contact [facility]  Dept: [phone]  and follow the prompts.  Office hours are 8:00 a.m. to 4:30 p.m. Monday - Friday. Please note that voicemails left after 4:00 p.m. may not be returned until the following business day.  We are closed weekends and major holidays. You have access to a nurse at all times for urgent questions. Please call the main number to the clinic Dept: [phone] and follow the prompts. ? ? ?For any non-urgent questions, you may also contact your provider using MyChart. We now offer e-Visits for anyone 65 and older to request care online for non-urgent symptoms. For details visit [email or web address]. ?  ?Also download the MyChart app! Go to the app store, search "MyChart", open the app, select [facility], and log in with your MyChart username and password. ? ?Due to Covid, a mask is required upon entering the hospital/clinic. If you do not have a mask, one will be given to you upon arrival. For doctor visits, patients may have 1 support person aged 67 or older with them. For treatment visits, patients cannot have anyone with them due to current Covid guidelines and our immunocompromised population.  ? ?

## 2021-09-29 NOTE — Progress Notes (Signed)
Instructed per Cassie Heilingoetter, PA, Magnesium good and she can stop supplement if she wants. Ryelee verbalized understanding. ?

## 2021-10-03 ENCOUNTER — Telehealth: Payer: Self-pay | Admitting: Pulmonary Disease

## 2021-10-04 DIAGNOSIS — I10 Essential (primary) hypertension: Secondary | ICD-10-CM | POA: Diagnosis not present

## 2021-10-04 DIAGNOSIS — C3491 Malignant neoplasm of unspecified part of right bronchus or lung: Secondary | ICD-10-CM | POA: Diagnosis not present

## 2021-10-04 DIAGNOSIS — R5383 Other fatigue: Secondary | ICD-10-CM | POA: Diagnosis not present

## 2021-10-04 NOTE — Telephone Encounter (Signed)
[name], DO  Rosana Berger, CMA; P [facility] Triage Pool ?Caller: Unspecified (Yesterday,  1:24 PM) ?No there is not.  ?Can she put a pad or cushion over it?  ?Thanks  ?BLI   ?  ? ? ?Called the pt and there was no answer- LMTCB.   ?

## 2021-10-04 NOTE — Telephone Encounter (Signed)
Spoke with the pt  ?She states that when she uses her vibratory vest it causes discomfort in her chest a portacath site  ?She is asking if there is a type of vest that does not cover her chest, but just her back  ?I am not sure that such a vest exists  ?Dr Valeta Harms, please advise, thanks! ?

## 2021-10-06 ENCOUNTER — Other Ambulatory Visit: Payer: Self-pay | Admitting: Physician Assistant

## 2021-10-06 ENCOUNTER — Telehealth: Payer: Self-pay | Admitting: Medical Oncology

## 2021-10-06 DIAGNOSIS — C3491 Malignant neoplasm of unspecified part of right bronchus or lung: Secondary | ICD-10-CM

## 2021-10-06 DIAGNOSIS — E876 Hypokalemia: Secondary | ICD-10-CM

## 2021-10-06 NOTE — Telephone Encounter (Signed)
Called and spoke with pt letting her know the info per [facility] and she verbalized understanding. Nothing further needed. ?

## 2021-10-06 NOTE — Telephone Encounter (Signed)
Very tired . She is so weak and asking if she needs iron. Her PCP gave her a b 12 injection . ?Per Cassie, I  sent message to schedule pt  appts for tomorrow. ? ? ? ? ?

## 2021-10-06 NOTE — Telephone Encounter (Signed)
Allison [name] notified of appts tomorrow. ?

## 2021-10-07 ENCOUNTER — Inpatient Hospital Stay: Payer: Medicare HMO

## 2021-10-07 ENCOUNTER — Encounter: Payer: Self-pay | Admitting: Physician Assistant

## 2021-10-07 ENCOUNTER — Other Ambulatory Visit: Payer: Self-pay | Admitting: Nurse Practitioner

## 2021-10-07 ENCOUNTER — Inpatient Hospital Stay: Payer: Medicare HMO | Admitting: Physician Assistant

## 2021-10-07 ENCOUNTER — Other Ambulatory Visit: Payer: Self-pay

## 2021-10-07 VITALS — BP 117/60 | HR 76 | Temp 97.9°F | Resp 18

## 2021-10-07 VITALS — BP 125/76 | HR 72 | Temp 97.8°F | Resp 20 | Wt 116.0 lb

## 2021-10-07 DIAGNOSIS — Z5111 Encounter for antineoplastic chemotherapy: Secondary | ICD-10-CM | POA: Diagnosis not present

## 2021-10-07 DIAGNOSIS — T451X5A Adverse effect of antineoplastic and immunosuppressive drugs, initial encounter: Secondary | ICD-10-CM | POA: Diagnosis not present

## 2021-10-07 DIAGNOSIS — D709 Neutropenia, unspecified: Secondary | ICD-10-CM | POA: Diagnosis not present

## 2021-10-07 DIAGNOSIS — C3411 Malignant neoplasm of upper lobe, right bronchus or lung: Secondary | ICD-10-CM | POA: Diagnosis not present

## 2021-10-07 DIAGNOSIS — D6481 Anemia due to antineoplastic chemotherapy: Secondary | ICD-10-CM

## 2021-10-07 DIAGNOSIS — C3491 Malignant neoplasm of unspecified part of right bronchus or lung: Secondary | ICD-10-CM

## 2021-10-07 DIAGNOSIS — J479 Bronchiectasis, uncomplicated: Secondary | ICD-10-CM | POA: Diagnosis not present

## 2021-10-07 DIAGNOSIS — Z95828 Presence of other vascular implants and grafts: Secondary | ICD-10-CM

## 2021-10-07 DIAGNOSIS — J910 Malignant pleural effusion: Secondary | ICD-10-CM | POA: Diagnosis not present

## 2021-10-07 DIAGNOSIS — R1013 Epigastric pain: Secondary | ICD-10-CM

## 2021-10-07 DIAGNOSIS — D649 Anemia, unspecified: Secondary | ICD-10-CM

## 2021-10-07 DIAGNOSIS — Z7982 Long term (current) use of aspirin: Secondary | ICD-10-CM | POA: Diagnosis not present

## 2021-10-07 DIAGNOSIS — Z79899 Other long term (current) drug therapy: Secondary | ICD-10-CM | POA: Diagnosis not present

## 2021-10-07 DIAGNOSIS — D701 Agranulocytosis secondary to cancer chemotherapy: Secondary | ICD-10-CM | POA: Diagnosis not present

## 2021-10-07 LAB — CBC WITH DIFFERENTIAL (CANCER CENTER ONLY)
Abs Immature Granulocytes: 0.01 10*3/uL (ref 0.00–0.07)
Basophils Absolute: 0 10*3/uL (ref 0.0–0.1)
Basophils Relative: 0 %
Eosinophils Absolute: 0 10*3/uL (ref 0.0–0.5)
Eosinophils Relative: 2 %
HCT: 26.6 % — ABNORMAL LOW (ref 36.0–46.0)
Hemoglobin: 8.4 g/dL — ABNORMAL LOW (ref 12.0–15.0)
Immature Granulocytes: 1 %
Lymphocytes Relative: 32 %
Lymphs Abs: 0.4 10*3/uL — ABNORMAL LOW (ref 0.7–4.0)
MCH: 29.3 pg (ref 26.0–34.0)
MCHC: 31.6 g/dL (ref 30.0–36.0)
MCV: 92.7 fL (ref 80.0–100.0)
Monocytes Absolute: 0.2 10*3/uL (ref 0.1–1.0)
Monocytes Relative: 19 %
Neutro Abs: 0.5 10*3/uL — ABNORMAL LOW (ref 1.7–7.7)
Neutrophils Relative %: 46 %
Platelet Count: 107 10*3/uL — ABNORMAL LOW (ref 150–400)
RBC: 2.87 MIL/uL — ABNORMAL LOW (ref 3.87–5.11)
RDW: 14.1 % (ref 11.5–15.5)
WBC Count: 1.2 10*3/uL — ABNORMAL LOW (ref 4.0–10.5)
nRBC: 0 % (ref 0.0–0.2)

## 2021-10-07 LAB — CMP (CANCER CENTER ONLY)
ALT: 46 U/L — ABNORMAL HIGH (ref 0–44)
AST: 37 U/L (ref 15–41)
Albumin: 3.6 g/dL (ref 3.5–5.0)
Alkaline Phosphatase: 57 U/L (ref 38–126)
Anion gap: 4 — ABNORMAL LOW (ref 5–15)
BUN: 14 mg/dL (ref 8–23)
CO2: 28 mmol/L (ref 22–32)
Calcium: 9.1 mg/dL (ref 8.9–10.3)
Chloride: 106 mmol/L (ref 98–111)
Creatinine: 1.03 mg/dL — ABNORMAL HIGH (ref 0.44–1.00)
GFR, Estimated: 58 mL/min — ABNORMAL LOW (ref >60)
Glucose, Bld: 98 mg/dL (ref 70–99)
Potassium: 3.6 mmol/L (ref 3.5–5.1)
Sodium: 138 mmol/L (ref 135–145)
Total Bilirubin: 0.5 mg/dL (ref 0.3–1.2)
Total Protein: 6.6 g/dL (ref 6.5–8.1)

## 2021-10-07 LAB — SAMPLE TO BLOOD BANK

## 2021-10-07 LAB — PREPARE RBC (CROSSMATCH)

## 2021-10-07 MED ORDER — SODIUM CHLORIDE 0.9% FLUSH
10.0000 mL | Freq: Once | INTRAVENOUS | Status: AC
  Administered 2021-10-07: 10 mL

## 2021-10-07 MED ORDER — ESOMEPRAZOLE MAGNESIUM 20 MG PO CPDR: 20.0000 mg | DELAYED_RELEASE_CAPSULE | Freq: Two times a day (BID) | ORAL | 2 refills | Status: DC

## 2021-10-07 MED ORDER — FILGRASTIM-SNDZ 300 MCG/0.5ML IJ SOSY
300.0000 ug | PREFILLED_SYRINGE | Freq: Once | INTRAMUSCULAR | Status: AC
  Administered 2021-10-07: 300 ug via SUBCUTANEOUS
  Filled 2021-10-07: qty 0.5

## 2021-10-07 MED ORDER — SODIUM CHLORIDE 0.9 % IV SOLN: Freq: Once | INTRAVENOUS | Status: AC

## 2021-10-07 MED ORDER — LORATADINE 10 MG PO TABS
10.0000 mg | ORAL_TABLET | Freq: Every day | ORAL | Status: DC
  Administered 2021-10-07: 10 mg via ORAL
  Filled 2021-10-07: qty 1

## 2021-10-07 NOTE — Patient Instructions (Signed)
Blood Transfusion, Adult, Care After This sheet gives you information about how to care for yourself after your procedure. Your doctor may also give you more specific instructions. If you have problems or questions, contact your doctor. What can I expect after the procedure? After the procedure, it is common to have: Bruising and soreness at the IV site. A headache. Follow these instructions at home: Insertion site care     Follow instructions from your doctor about how to take care of your insertion site. This is where an IV tube was put into your vein. Make sure you: Wash your hands with soap and water before and after you change your bandage (dressing). If you cannot use soap and water, use hand sanitizer. Change your bandage as told by your doctor. Check your insertion site every day for signs of infection. Check for: Redness, swelling, or pain. Bleeding from the site. Warmth. Pus or a bad smell. General instructions Take over-the-counter and prescription medicines only as told by your doctor. Rest as told by your doctor. Go back to your normal activities as told by your doctor. Keep all follow-up visits as told by your doctor. This is important. Contact a doctor if: You have itching or red, swollen areas of skin (hives). You feel worried or nervous (anxious). You feel weak after doing your normal activities. You have redness, swelling, warmth, or pain around the insertion site. You have blood coming from the insertion site, and the blood does not stop with pressure. You have pus or a bad smell coming from the insertion site. Get help right away if: You have signs of a serious reaction. This may be coming from an allergy or the body's defense system (immune system). Signs include: Trouble breathing or shortness of breath. Swelling of the face or feeling warm (flushed). Fever or chills. Head, chest, or back pain. Dark pee (urine) or blood in the pee. Widespread rash. Fast  heartbeat. Feeling dizzy or light-headed. You may receive your blood transfusion in an outpatient setting. If so, you will be told whom to contact to report any reactions. These symptoms may be an emergency. Do not wait to see if the symptoms will go away. Get medical help right away. Call your local emergency services (911 in the [place] Do not drive yourself to the hospital. Summary Bruising and soreness at the IV site are common. Check your insertion site every day for signs of infection. Rest as told by your doctor. Go back to your normal activities as told by your doctor. Get help right away if you have signs of a serious reaction. This information is not intended to replace advice given to you by your health care provider. Make sure you discuss any questions you have with your health care provider. Document Revised: 09/30/2020 Document Reviewed: 11/28/2018 Elsevier Patient Education  2023 Elsevier Inc.  

## 2021-10-07 NOTE — Progress Notes (Signed)
[facility] ?Symptom Management Appointment ? ?[name], MD ?[place] ?[place] [id number] ? ?DIAGNOSIS: Stage IV (T2 a,N2, M1a) non-small cell lung cancer, adenocarcinoma diagnosed in July 2019 and presented with right upper lobe lung mass in addition to mediastinal lymphadenopathy as well as bilateral pulmonary nodules and malignant right pleural effusion. ?  ?Biomarker Findings ?Microsatellite status - Cannot Be Determined ?Tumor Mutational Burden - Cannot Be Determined ?Genomic Findings ?For a complete list of the genes assayed, please refer to the Appendix. ?EGFR exon 19 deletion ([id number]) ?TP53 Y220C ?7 Disease relevant genes with no reportable alterations: KRAS, ALK, ?BRAF, MET, RET, ERBB2, ROS1  ? ?PRIOR THERAPY: ?1) Status post right Pleurx catheter placement by Dr. Prescott Gum for drainage of malignant right pleural effusion. ?2) palliative radiotherapy to the enlarging right upper lobe lung mass and mediastinum under the care of Dr. Sondra Come expected to be completed on January 05, 2021. ?3) Tagrisso 80 mg p.o. daily.  First dose was given on 01/29/2018.  Status post 39 months of treatment. ?  ? ?CURRENT THERAPY: Systemic chemotherapy with carboplatin for AUC of 5 and Alimta 500 Mg/M2 every 3 weeks.  First dose 03/17/2021.  The patient will also continue her current treatment with Tagrisso 80 mg p.o. daily.  She is status post 10 cycles. Starting from cycle #7, she will be on Alimta only 500 mg/m2.  ? ?CC: Weakness/Fatigue ? ?INTERVAL HISTORY: ?Allison [name] 71 y.o. female returns to the clinic today for a symptom management appointment accompanied by her significant other.  For the last few days, the patient has been endorsing increased fatigue and weakness.  She has had some progressively worsening anemia over the last few weeks.  Her hemoglobin was 8.6 8 days ago.  Her hemoglobin is 8.4 today.  She called clinic yesterday for her symptoms.  She does report she is  feeling a little bit better this morning after having a good nights rest but yesterday she was so tired that she reports she was having a hard time holding her head up.  She felt this way previously when she required a blood transfusion and states her symptoms are similar.  She reports her symptoms improved when she received a blood transfusion in the past, which was about 1 month ago.  ? ?She also has some neutropenia on labs.  She denies any symptoms of infection including fevers, chills, ear pain, sinus pressure, sore throat, changes in her baseline cough/shortness of breath, skin infections, abdominal pain, dysuria, or unusual diarrhea.  She does give herself Lovenox injections and has a very small area on her right abdomen that is a little erythematous and appears like it was recently scratched or a bug bite.  She reports she did not give herself a Lovenox injection in this area.  She also reports some clear nasal drainage without any nasal pressure/pain which she attributes to allergies.  She was previously taking Zyrtec but has not been taking this at this time.  She reports her stable cough and shortness of breath with exertion.  Denies any nausea or vomiting.  Denies any myalgias.  Denies any sick contacts.  She denies any abnormal bleeding except she reports that her stool looked dark for 3 consecutive bowel movements a few days ago.  She reports that her stools usually are a "mustard" color.  She denies any overt bleeding.  She does take an iron supplement.  She denies any Pepto-Bismol use.  She denies any associated  abdominal pain.  She reports that her stools were firm initially and never got loose.  She is here today for evaluation and recommendations regarding her condition. ? ? ? ?MEDICAL HISTORY: ?Past Medical History:  ?Diagnosis Date  ? Anemia   ? Anxiety   ? Arthritis   ? Asthma   ? exercise induced  ? Depression   ? PMH  ? Dyspnea   ? GERD (gastroesophageal reflux disease)   ? Glaucoma   ?  History of radiation therapy 01/05/2021  ? IMRT right lung  11/24/2020-01/05/2021  Dr [name]  ? Hypertension   ? lung ca dx'd 11/2017  ? right  ? Malignant pleural effusion   ? right  ? Nuclear sclerotic cataract of right eye 02/28/2021  ? Dr. Warden Fillers, cataract surgery February 2023  ? PONV (postoperative nausea and vomiting)   ? Pre-diabetes   ? Raynaud's disease   ? Raynaud's disease   ? Stroke [facility]) 01/2021  ? balance off, some express aphasia, weakness  ? ? ?ALLERGIES:  is allergic to penicillins, vicodin [hydrocodone-acetaminophen], and other. ? ?MEDICATIONS:  ?Current Outpatient Medications  ?Medication Sig Dispense Refill  ? ALPRAZolam (XANAX) 0.25 MG tablet Take 1 tablet (0.25 mg total) by mouth 3 (three) times daily as needed for anxiety. May take 2 tablets (0.5 mg total) at bedtime. 60 tablet 0  ? aspirin 81 MG chewable tablet Chew 1 tablet (81 mg total) by mouth daily.    ? carvedilol (COREG) 12.5 MG tablet Take 1 tablet (12.5 mg total) by mouth 2 (two) times daily. 180 tablet 1  ? cetirizine (ZYRTEC) 5 MG tablet Take 5 mg by mouth daily.    ? dorzolamide-timolol (COSOPT) 22.3-6.8 MG/ML ophthalmic solution Place 1 drop into both eyes 2 (two) times daily.    ? enoxaparin (LOVENOX) 60 MG/0.6ML injection Inject 0.6 mLs (60 mg total) into the skin every 12 (twelve) hours. 60 mL 1  ? FLUoxetine (PROZAC) 10 MG capsule Take 1 capsule (10 mg total) by mouth daily. 90 capsule 3  ? folic acid (FOLVITE) 1 MG tablet TAKE 1 TABLET BY MOUTH EVERY DAY 90 tablet 1  ? hyoscyamine (LEVSIN SL) 0.125 MG SL tablet Place 2 tablets (0.25 mg total) under the tongue every 6 (six) hours as needed for cramping. Take 1-2 tablets under the tongue every 6 hours as needed for cramping 30 tablet 0  ? ipratropium-albuterol (DUONEB) 0.5-2.5 (3) MG/3ML SOLN Take 3 mLs by nebulization every 6 (six) hours as needed (Asthma).    ? Iron-FA-B Cmp-C-Biot-Probiotic (FUSION PLUS PO) Take 1 capsule by mouth daily.    ? isosorbide  mononitrate (IMDUR) 30 MG 24 hr tablet Take 30 mg by mouth daily.    ? latanoprost (XALATAN) 0.005 % ophthalmic solution Place 1 drop into both eyes at bedtime.     ? osimertinib mesylate (TAGRISSO) 80 MG tablet Take 1 tablet (80 mg total) by mouth daily. 30 tablet 3  ? REPATHA SURECLICK 341 MG/ML SOAJ Inject 140 mg into the skin every 14 (fourteen) days. 6 mL 3  ? acetaminophen (TYLENOL) 500 MG tablet Take 1,000 mg by mouth every 6 (six) hours as needed (pain). (Patient not taking: Reported on 10/07/2021)    ? brompheniramine-pseudoephedrine-DM 30-2-10 MG/5ML syrup Take 5 mLs by mouth 3 (three) times daily as needed. (Patient not taking: Reported on 10/07/2021) 120 mL 0  ? dexamethasone (DECADRON) 4 MG tablet Take 1 tablet twice a day the day before, the day of, and the day  after chemotherapy (Patient not taking: Reported on 10/07/2021) 40 tablet 2  ? dronabinol (MARINOL) 2.5 MG capsule Take 1 capsule (2.5 mg total) by mouth 2 (two) times daily as needed (decreased appetite). (Patient not taking: Reported on 10/07/2021) 60 capsule 0  ? esomeprazole (NEXIUM) 20 MG capsule Take 1 capsule (20 mg total) by mouth in the morning and at bedtime. (Patient not taking: Reported on 10/07/2021) 60 capsule 2  ? ondansetron (ZOFRAN-ODT) 4 MG disintegrating tablet Take 1 tablet (4 mg total) by mouth every 8 (eight) hours as needed for nausea or vomiting. (Patient not taking: Reported on 10/07/2021) 30 tablet 1  ? potassium chloride SA (KLOR-CON M) 20 MEQ tablet Take 1 tablet (20 mEq total) by mouth daily. 5 tablet 0  ? ?No current facility-administered medications for this visit.  ? ? ?SURGICAL HISTORY:  ?Past Surgical History:  ?Procedure Laterality Date  ? ABDOMINAL HYSTERECTOMY    ? partial  ? BRONCHIAL BIOPSY  04/21/2021  ? Procedure: BRONCHIAL BIOPSIES;  Surgeon: [name], DO;  Location: [facility];  Service: Pulmonary;;  ? BRONCHIAL BRUSHINGS  04/21/2021  ? Procedure: BRONCHIAL BRUSHINGS;  Surgeon: [name], DO;   Location: [facility];  Service: Pulmonary;;  ? BRONCHIAL NEEDLE ASPIRATION BIOPSY  04/21/2021  ? Procedure: BRONCHIAL NEEDLE ASPIRATION BIOPSIES;  Surgeon: [name], DO;  Location: [facility];  Service

## 2021-10-08 ENCOUNTER — Inpatient Hospital Stay: Payer: Medicare HMO

## 2021-10-08 VITALS — BP 141/71 | HR 75 | Temp 97.2°F | Resp 17

## 2021-10-08 DIAGNOSIS — D6481 Anemia due to antineoplastic chemotherapy: Secondary | ICD-10-CM | POA: Diagnosis not present

## 2021-10-08 DIAGNOSIS — J910 Malignant pleural effusion: Secondary | ICD-10-CM | POA: Diagnosis not present

## 2021-10-08 DIAGNOSIS — Z95828 Presence of other vascular implants and grafts: Secondary | ICD-10-CM

## 2021-10-08 DIAGNOSIS — D701 Agranulocytosis secondary to cancer chemotherapy: Secondary | ICD-10-CM | POA: Diagnosis not present

## 2021-10-08 DIAGNOSIS — Z5111 Encounter for antineoplastic chemotherapy: Secondary | ICD-10-CM | POA: Diagnosis not present

## 2021-10-08 DIAGNOSIS — C3411 Malignant neoplasm of upper lobe, right bronchus or lung: Secondary | ICD-10-CM | POA: Diagnosis not present

## 2021-10-08 DIAGNOSIS — Z79899 Other long term (current) drug therapy: Secondary | ICD-10-CM | POA: Diagnosis not present

## 2021-10-08 DIAGNOSIS — Z7982 Long term (current) use of aspirin: Secondary | ICD-10-CM | POA: Diagnosis not present

## 2021-10-08 DIAGNOSIS — T451X5A Adverse effect of antineoplastic and immunosuppressive drugs, initial encounter: Secondary | ICD-10-CM | POA: Diagnosis not present

## 2021-10-08 LAB — TYPE AND SCREEN
ABO/RH(D): O POS
Antibody Screen: NEGATIVE
Unit division: 0

## 2021-10-08 LAB — BPAM RBC
Blood Product Expiration Date: 202305222359
ISSUE DATE / TIME: 202304211237
Unit Type and Rh: 5100

## 2021-10-08 MED ORDER — FILGRASTIM-SNDZ 300 MCG/0.5ML IJ SOSY
300.0000 ug | PREFILLED_SYRINGE | Freq: Once | INTRAMUSCULAR | Status: AC
  Administered 2021-10-08: 300 ug via SUBCUTANEOUS
  Filled 2021-10-08: qty 0.5

## 2021-10-13 DIAGNOSIS — D701 Agranulocytosis secondary to cancer chemotherapy: Secondary | ICD-10-CM | POA: Diagnosis not present

## 2021-10-13 DIAGNOSIS — J910 Malignant pleural effusion: Secondary | ICD-10-CM | POA: Diagnosis not present

## 2021-10-13 DIAGNOSIS — Z79899 Other long term (current) drug therapy: Secondary | ICD-10-CM | POA: Diagnosis not present

## 2021-10-13 DIAGNOSIS — Z5111 Encounter for antineoplastic chemotherapy: Secondary | ICD-10-CM | POA: Diagnosis not present

## 2021-10-13 DIAGNOSIS — C3411 Malignant neoplasm of upper lobe, right bronchus or lung: Secondary | ICD-10-CM | POA: Diagnosis not present

## 2021-10-13 DIAGNOSIS — Z7982 Long term (current) use of aspirin: Secondary | ICD-10-CM | POA: Diagnosis not present

## 2021-10-13 DIAGNOSIS — D6481 Anemia due to antineoplastic chemotherapy: Secondary | ICD-10-CM | POA: Diagnosis not present

## 2021-10-13 DIAGNOSIS — T451X5A Adverse effect of antineoplastic and immunosuppressive drugs, initial encounter: Secondary | ICD-10-CM | POA: Diagnosis not present

## 2021-10-14 ENCOUNTER — Telehealth: Payer: Self-pay | Admitting: Physician Assistant

## 2021-10-14 ENCOUNTER — Other Ambulatory Visit: Payer: Self-pay

## 2021-10-14 DIAGNOSIS — D6481 Anemia due to antineoplastic chemotherapy: Secondary | ICD-10-CM

## 2021-10-14 DIAGNOSIS — Z5111 Encounter for antineoplastic chemotherapy: Secondary | ICD-10-CM | POA: Diagnosis not present

## 2021-10-14 DIAGNOSIS — Z7982 Long term (current) use of aspirin: Secondary | ICD-10-CM | POA: Diagnosis not present

## 2021-10-14 DIAGNOSIS — D701 Agranulocytosis secondary to cancer chemotherapy: Secondary | ICD-10-CM | POA: Diagnosis not present

## 2021-10-14 DIAGNOSIS — C3411 Malignant neoplasm of upper lobe, right bronchus or lung: Secondary | ICD-10-CM | POA: Diagnosis not present

## 2021-10-14 DIAGNOSIS — Z79899 Other long term (current) drug therapy: Secondary | ICD-10-CM | POA: Diagnosis not present

## 2021-10-14 DIAGNOSIS — J910 Malignant pleural effusion: Secondary | ICD-10-CM | POA: Diagnosis not present

## 2021-10-14 DIAGNOSIS — T451X5A Adverse effect of antineoplastic and immunosuppressive drugs, initial encounter: Secondary | ICD-10-CM | POA: Diagnosis not present

## 2021-10-14 LAB — OCCULT BLOOD X 1 CARD TO LAB, STOOL
Fecal Occult Bld: NEGATIVE
Fecal Occult Bld: NEGATIVE

## 2021-10-14 NOTE — Telephone Encounter (Signed)
I called the patient to let her know her stool cards were negative for blood. She also mentioned she feels a "whole lot" better than when I saw her last week. I will see her as scheduled next week. She was appreciative of the call.  ?

## 2021-10-18 ENCOUNTER — Telehealth: Payer: Self-pay | Admitting: Internal Medicine

## 2021-10-18 NOTE — Telephone Encounter (Signed)
Rescheduled 05/04 appointment to 05/05 due to provider pal, patient has been called and notified.  ?

## 2021-10-19 ENCOUNTER — Ambulatory Visit: Payer: Medicare HMO | Admitting: Adult Health

## 2021-10-19 ENCOUNTER — Telehealth: Payer: Self-pay | Admitting: Adult Health

## 2021-10-19 ENCOUNTER — Encounter: Payer: Self-pay | Admitting: Adult Health

## 2021-10-19 VITALS — BP 163/73 | HR 67 | Ht 64.0 in | Wt 116.0 lb

## 2021-10-19 DIAGNOSIS — C3491 Malignant neoplasm of unspecified part of right bronchus or lung: Secondary | ICD-10-CM | POA: Diagnosis not present

## 2021-10-19 DIAGNOSIS — Z7409 Other reduced mobility: Secondary | ICD-10-CM

## 2021-10-19 DIAGNOSIS — I639 Cerebral infarction, unspecified: Secondary | ICD-10-CM

## 2021-10-19 DIAGNOSIS — R4789 Other speech disturbances: Secondary | ICD-10-CM

## 2021-10-19 NOTE — Telephone Encounter (Signed)
Allison [name] messaged me back stating they can take this patient.  ?

## 2021-10-19 NOTE — Progress Notes (Signed)
?[facility] ?[address] ?[place]. [place] [address] ?[phone] ? ?     STROKE FOLLOW UP NOTE ? ?Ms. [name] ?Date of Birth:  [birth year] ?Medical Record Number:  [id number]  ? ?Reason for Referral: stroke follow up ? ? ? ?SUBJECTIVE: ? ? ?CHIEF COMPLAINT:  ?Chief Complaint  ?Patient presents with  ? Follow-up  ?  Rm 2 with friend [name]  ?Pt is well, still having some imbalance and speech impairment   ? ? ?HPI:  ? ?Update 10/19/2021 JM: Patient returns for 63-month stroke follow-up accompanied by her friend, [name].  Reports continued imbalance - completed HH PT a few months ago, some improvement noted, does not do exercises routinely and only minimal activity. Ambulates with cane, no recent falls.  Also mentions speech difficulty since her stroke - did not discuss at prior visit as she was concerned of other things and thought her speech would get better - reports hesitant speech and at times stuttering. Questions if this could improve still at this time. She does endorse significant anxiety currently being followed by behavioral health as well as generalized weakness and fatigue ongoing chemo.  Denies new stroke/TIA symptoms. Remains on Lovenox subcu injections and Repatha.  Blood pressure today 163/73.  Routinely monitors at home and typically stable - admits to feeling anxious about today's visit.  Continues to closely follow with oncology currently receiving chemotherapy every 3 weeks.  Also routinely followed by pulmonology, cardiology, PCP and palliative care.  No further concerns at this time ? ? ? ? ?History provided for reference purposes only ?Initial visit 04/12/2021 JM: Ms. Lajara is being seen for hospital follow-up accompanied by her friend, [name].  Overall stable from stroke standpoint without new stroke/TIA symptoms.  Reports residual imbalance and blurred vision with some improvement since discharge. She was seen by PT but difficulty participating due to generalized  weakness and SOB.  Ambulates short distance with a cane and w/c for long distance.  She has been seen by ophthalmologist Dr. Katy Fitch - mentions needed cataract removal. ? ?Her greatest concern today is in regards to ongoing Lovenox use and bleeding from Port-A-Cath site which was placed 10/4. She reports being told by oncology that our office would be managing Lovenox and making further recommendations whether ongoing need is indicated and further recommendations regarding continued bleeding at cath site. She was advised that oncology would be managing her lovenox and should f/u with IR (who placed catheter) for further discussion regarding these concerns - she became frustrated and feels like she just keeps "getting the run around".  ? ?She otherwise is tolerating Lovenox without any other bleeding or bruising concerns.  She also remains on Repatha which has been ongoing.  Blood pressure today 148/86. ? ?Routinely followed by oncology. Repeat CT scan showed unchanged treated mass of the right pulmonary apex and slight interval increase in the moderate loculated right pleural effusion with extensive pleural thickening and nodularity as well as septal thickening and nodularity throughout the lungs as well as extensive nodularity and septal thickening of the left lung worse compared to prior exam -findings consistent with worsened pleural and lymphangitic metastatic disease.  Systemic chemotherapy initiated on 9/29. She is scheduled to undergo bronchoscopy 11/3 routinely followed by pulmonology. Recent visit with oncology provided her with several options including consideration of palliative care/hospice care vs continuing on same treatment vs referring patient to another facility for consideration of second opinion.  She opted to continue current treatment plan as well as  being referred to [facility] for second opinion. ? ?Recently followed by pulmonology and cardiology ? ? ?Stroke admission 02/11/2021 ?Ms.  GENA LASKI is a 71 y.o. female  with a past medical history significant for stage IV adenocarcinoma of the right lung (undergoing chemotherapy and radiation), hypercoagulable state secondary to malignancy (recent DVT and PE on Eliquis), hypertension, prediabetes, and anxiety/depression.  Patient presented on 02/11/2021 for left-sided weakness and double vision.  Personally reviewed hospitalization pertinent progress notes, lab work and imaging.  Evaluated by Dr. Jaynee Eagles.  Stroke work-up revealed bilateral cerebral and cerebellar infarcts, embolic likely due to hypercoagulable state in stage IV lung cancer.  CTA head/neck left VA occlusion indeterminate possibly reflect changes of underlying dissection per report and 58mm outpouching arising from the cavernous left ICA possibly small aneurysm vs vascular infundibulum.  Recommend repeating MRI brain in 3-4 weeks to rule out metastasis although lower suspicion.  EF 60 to 65%.  LDL 91.  A1c 5.7.  On eliquis PTA and recommended switching to Lovenox at discharge per oncology recommendations. No prior stroke history.  Therapy eval's recommended outpatient PT/OT. ? ? ? ? ? ? ?PERTINENT IMAGING ? ?MR BRAIN W WO CONTRAST ?02/11/2021 ?IMPRESSION: ?1. Patchy multifocal acute to subacute ischemic infarcts involving ?the bilateral cerebral and cerebellar hemispheres. Associated small ?volume petechial blood products at the right cerebellum without ?frank hemorrhagic transformation. No significant mass effect. ?2. Associated scattered areas of patchy enhancement as above, ?favored to be secondary to subacute ischemic changes. A short ?interval follow-up MRI to ensure these changes resolve is ?recommended, particularly given the patient history of stage IV lung ?cancer and to ensure no underlying metastatic disease is present. ?3. Abnormal flow void within the left vertebral artery, which could ?be related to slow flow and/or occlusion. ?4. Underlying chronic microvascular ischemic  disease. ? ?02/27/2021 ?IMPRESSION: This MRI of the brain with and without contrast shows the following: ?1.   Multiple T2/FLAIR hyperintense foci in the hemispheres, some of which have restricted diffusion consistent with subacute infarctions, likely embolic.  Compared to the 02/11/2021 MRI, most of these have evolved and are less hyperintense.  There do not appear to be any new foci on diffusion-weighted images. ?2.   Some small foci in the left occipital lobe, left posterior frontal lobe, right frontal lobe and right cerebellar hemisphere enhance after contrast.  The four foci seen in the right frontal lobe was not present on the 02/11/2021 MRI.  Although most likely related to the recent strokes, the foci in the right frontal lobe do not have corresponding DWI changes and metastasis cannot be ruled out.  Consider follow-up evaluation in several more weeks. ? ?CTA HEAD/NECK 02/12/2021 ?IMPRESSION: ?1. Increasing attenuation within the left vertebral artery as it ?courses cephalad within the neck, with occlusion at the skull base. ?Finding is age indeterminate, but could reflect changes of an ?underlying dissection. ?2. Mild atheromatous change elsewhere about the major arterial ?vasculature of the head and neck. No other large vessel occlusion. ?No other proximal high-grade or correctable stenosis. ?3. 2 mm outpouching arising from the cavernous left ICA, which could ?reflect a small aneurysm versus vascular infundibulum. ?4. Spiculated right apical mass, consistent with known malignancy. ?Diffuse pleuroparenchymal thickening about the right lung with ?scattered interlobular septal thickening and nodularity within the ?remainder of the visualized lungs, concerning for lymphangitic ?spread of tumor. Superimposed infection could be considered in the ?correct clinical setting. ? ?2D ECHO  ?02/08/2021 ?IMPRESSIONS  ? 1. Left ventricular ejection fraction,  by estimation, is 60 to 65%. The  ?left ventricle has normal  function. The left ventricle has no regional  ?wall motion abnormalities. Left ventricular diastolic parameters were  ?normal.  ? 2. Right ventricular systolic function is normal. The right ventricular  ?

## 2021-10-19 NOTE — Patient Instructions (Signed)
You will be called to start home health therapy - if insurance will not pay for any additional sessions, may need to consider outpatient therapy but you will be notified if this is the case ? ?Continue to follow with PCP and other specialities for secondary stroke prevention measures ? ? ?Follow up as needed ?

## 2021-10-19 NOTE — Telephone Encounter (Signed)
Patient was previous seen by [facility]. Sent to [place] with [facility] to see if he will be able to take the patient.  ?

## 2021-10-20 ENCOUNTER — Inpatient Hospital Stay: Payer: Medicare HMO

## 2021-10-20 ENCOUNTER — Inpatient Hospital Stay: Payer: Medicare HMO | Admitting: Nurse Practitioner

## 2021-10-20 ENCOUNTER — Inpatient Hospital Stay: Payer: Medicare HMO | Admitting: Physician Assistant

## 2021-10-21 ENCOUNTER — Inpatient Hospital Stay: Payer: Medicare HMO

## 2021-10-21 ENCOUNTER — Encounter: Payer: Self-pay | Admitting: General Practice

## 2021-10-21 ENCOUNTER — Other Ambulatory Visit: Payer: Self-pay

## 2021-10-21 ENCOUNTER — Inpatient Hospital Stay: Payer: Medicare HMO | Admitting: Internal Medicine

## 2021-10-21 ENCOUNTER — Inpatient Hospital Stay: Payer: Medicare HMO | Attending: Internal Medicine

## 2021-10-21 VITALS — BP 151/68 | HR 72 | Temp 97.9°F | Resp 15 | Ht 64.0 in | Wt 116.5 lb

## 2021-10-21 DIAGNOSIS — F419 Anxiety disorder, unspecified: Secondary | ICD-10-CM | POA: Insufficient documentation

## 2021-10-21 DIAGNOSIS — R0609 Other forms of dyspnea: Secondary | ICD-10-CM | POA: Diagnosis not present

## 2021-10-21 DIAGNOSIS — C3491 Malignant neoplasm of unspecified part of right bronchus or lung: Secondary | ICD-10-CM

## 2021-10-21 DIAGNOSIS — C7800 Secondary malignant neoplasm of unspecified lung: Secondary | ICD-10-CM | POA: Diagnosis not present

## 2021-10-21 DIAGNOSIS — Z86718 Personal history of other venous thrombosis and embolism: Secondary | ICD-10-CM | POA: Diagnosis not present

## 2021-10-21 DIAGNOSIS — C3490 Malignant neoplasm of unspecified part of unspecified bronchus or lung: Secondary | ICD-10-CM | POA: Diagnosis not present

## 2021-10-21 DIAGNOSIS — Z5111 Encounter for antineoplastic chemotherapy: Secondary | ICD-10-CM | POA: Insufficient documentation

## 2021-10-21 DIAGNOSIS — Z8673 Personal history of transient ischemic attack (TIA), and cerebral infarction without residual deficits: Secondary | ICD-10-CM | POA: Diagnosis not present

## 2021-10-21 DIAGNOSIS — Z923 Personal history of irradiation: Secondary | ICD-10-CM | POA: Insufficient documentation

## 2021-10-21 DIAGNOSIS — R69 Illness, unspecified: Secondary | ICD-10-CM | POA: Diagnosis not present

## 2021-10-21 DIAGNOSIS — Z66 Do not resuscitate: Secondary | ICD-10-CM | POA: Diagnosis not present

## 2021-10-21 DIAGNOSIS — C771 Secondary and unspecified malignant neoplasm of intrathoracic lymph nodes: Secondary | ICD-10-CM | POA: Insufficient documentation

## 2021-10-21 DIAGNOSIS — Z95828 Presence of other vascular implants and grafts: Secondary | ICD-10-CM

## 2021-10-21 DIAGNOSIS — Z7901 Long term (current) use of anticoagulants: Secondary | ICD-10-CM | POA: Diagnosis not present

## 2021-10-21 DIAGNOSIS — J910 Malignant pleural effusion: Secondary | ICD-10-CM | POA: Diagnosis not present

## 2021-10-21 DIAGNOSIS — Z79899 Other long term (current) drug therapy: Secondary | ICD-10-CM | POA: Diagnosis not present

## 2021-10-21 DIAGNOSIS — C3411 Malignant neoplasm of upper lobe, right bronchus or lung: Secondary | ICD-10-CM | POA: Diagnosis not present

## 2021-10-21 DIAGNOSIS — R5383 Other fatigue: Secondary | ICD-10-CM | POA: Insufficient documentation

## 2021-10-21 DIAGNOSIS — Z515 Encounter for palliative care: Secondary | ICD-10-CM | POA: Insufficient documentation

## 2021-10-21 LAB — CBC WITH DIFFERENTIAL (CANCER CENTER ONLY)
Abs Immature Granulocytes: 0.01 10*3/uL (ref 0.00–0.07)
Basophils Absolute: 0 10*3/uL (ref 0.0–0.1)
Basophils Relative: 1 %
Eosinophils Absolute: 0 10*3/uL (ref 0.0–0.5)
Eosinophils Relative: 0 %
HCT: 33.4 % — ABNORMAL LOW (ref 36.0–46.0)
Hemoglobin: 10.8 g/dL — ABNORMAL LOW (ref 12.0–15.0)
Immature Granulocytes: 0 %
Lymphocytes Relative: 20 %
Lymphs Abs: 0.6 10*3/uL — ABNORMAL LOW (ref 0.7–4.0)
MCH: 29.5 pg (ref 26.0–34.0)
MCHC: 32.3 g/dL (ref 30.0–36.0)
MCV: 91.3 fL (ref 80.0–100.0)
Monocytes Absolute: 0.1 10*3/uL (ref 0.1–1.0)
Monocytes Relative: 4 %
Neutro Abs: 2.2 10*3/uL (ref 1.7–7.7)
Neutrophils Relative %: 75 %
Platelet Count: 216 10*3/uL (ref 150–400)
RBC: 3.66 MIL/uL — ABNORMAL LOW (ref 3.87–5.11)
RDW: 13.2 % (ref 11.5–15.5)
WBC Count: 2.9 10*3/uL — ABNORMAL LOW (ref 4.0–10.5)
nRBC: 0 % (ref 0.0–0.2)

## 2021-10-21 LAB — CMP (CANCER CENTER ONLY)
ALT: 52 U/L — ABNORMAL HIGH (ref 0–44)
AST: 43 U/L — ABNORMAL HIGH (ref 15–41)
Albumin: 4.1 g/dL (ref 3.5–5.0)
Alkaline Phosphatase: 75 U/L (ref 38–126)
Anion gap: 10 (ref 5–15)
BUN: 27 mg/dL — ABNORMAL HIGH (ref 8–23)
CO2: 28 mmol/L (ref 22–32)
Calcium: 9.7 mg/dL (ref 8.9–10.3)
Chloride: 102 mmol/L (ref 98–111)
Creatinine: 1.21 mg/dL — ABNORMAL HIGH (ref 0.44–1.00)
GFR, Estimated: 48 mL/min — ABNORMAL LOW (ref >60)
Glucose, Bld: 175 mg/dL — ABNORMAL HIGH (ref 70–99)
Potassium: 3.4 mmol/L — ABNORMAL LOW (ref 3.5–5.1)
Sodium: 140 mmol/L (ref 135–145)
Total Bilirubin: 0.4 mg/dL (ref 0.3–1.2)
Total Protein: 7.6 g/dL (ref 6.5–8.1)

## 2021-10-21 LAB — SAMPLE TO BLOOD BANK

## 2021-10-21 MED ORDER — SODIUM CHLORIDE 0.9 % IV SOLN
400.0000 mg/m2 | Freq: Once | INTRAVENOUS | Status: AC
  Administered 2021-10-21: 600 mg via INTRAVENOUS
  Filled 2021-10-21: qty 20

## 2021-10-21 MED ORDER — SODIUM CHLORIDE 0.9% FLUSH
10.0000 mL | INTRAVENOUS | Status: DC | PRN
  Administered 2021-10-21: 10 mL

## 2021-10-21 MED ORDER — ONDANSETRON HCL 4 MG/2ML IJ SOLN
8.0000 mg | Freq: Once | INTRAMUSCULAR | Status: AC
  Administered 2021-10-21: 8 mg via INTRAVENOUS
  Filled 2021-10-21: qty 4

## 2021-10-21 MED ORDER — HEPARIN SOD (PORK) LOCK FLUSH 100 UNIT/ML IV SOLN
500.0000 [IU] | Freq: Once | INTRAVENOUS | Status: AC | PRN
  Administered 2021-10-21: 500 [IU]

## 2021-10-21 MED ORDER — SODIUM CHLORIDE 0.9 % IV SOLN: Freq: Once | INTRAVENOUS | Status: AC

## 2021-10-21 MED ORDER — SODIUM CHLORIDE 0.9% FLUSH
10.0000 mL | Freq: Once | INTRAVENOUS | Status: AC
  Administered 2021-10-21: 10 mL

## 2021-10-21 NOTE — Progress Notes (Signed)
[facility] Spiritual Care Note ? ?Follow Araina through [facility] and visited with her in the lobby after treatment today. She continues to look on the bright side, using perspective and gratitude to cope. She had a recent surprise visit from family, which brought much joy (including her 87-week-old great granddaughter), and she is looking forward to a visit from her daughter this weekend, including an outing and garden time. We will keep in touch through group and visits for pastoral listening, encouragement, and prayer. ? ? ?Chaplain Lorrin Jackson, MDiv, [facility] ?Pager [phone] ?Voicemail [phone]  ?

## 2021-10-21 NOTE — Progress Notes (Signed)
?    [facility] ?[phone]) [phone]   [phone] ? ?OFFICE PROGRESS NOTE ? ?[name], MD ?[place] ?[place] [address] ? ?DIAGNOSIS: Stage IV (T2 a,N2, M1a) non-small cell lung cancer, adenocarcinoma diagnosed in July 2019 and presented with right upper lobe lung mass in addition to mediastinal lymphadenopathy as well as bilateral pulmonary nodules and malignant right pleural effusion. ?  ?Biomarker Findings ?Microsatellite status - Cannot Be Determined ?Tumor Mutational Burden - Cannot Be Determined ?Genomic Findings ?For a complete list of the genes assayed, please refer to the Appendix. ?EGFR exon 19 deletion ([id number]) ?TP53 Y220C ?7 Disease relevant genes with no reportable alterations: KRAS, ALK, ?BRAF, MET, RET, ERBB2, ROS1  ?  ?PRIOR THERAPY:  ?1) Status post right Pleurx catheter placement by Dr. Prescott Gum for drainage of malignant right pleural effusion. ?2) palliative radiotherapy to the enlarging right upper lobe lung mass and mediastinum under the care of Dr. Sondra Come expected to be completed on January 05, 2021. ?3) Tagrisso 80 mg p.o. daily.  First dose was given on 01/29/2018.  Status post 39 months of treatment. ?  ?CURRENT THERAPY: Systemic chemotherapy with carboplatin for AUC of 5 and Alimta 500 Mg/M2 every 3 weeks.  First dose 03/17/2021.  The patient will also continue her current treatment with Tagrisso 80 mg p.o. daily.  She is status post 10 cycles. Starting from cycle #7, she will be on Alimta only 500 mg/m2. ? ?INTERVAL HISTORY: ?Allison [name] 71 y.o. female returns to the clinic today for follow-up visit accompanied by her boyfriend.  The patient is feeling fine today with no concerning complaints.  She has less fatigue than before.  She denied having any chest pain, shortness of breath, cough or hemoptysis.  She has no nausea, vomiting, diarrhea or constipation.  She has no headache or visual changes.  She is expected to start  physical therapy as well as speech therapy soon.  She has no recent weight loss or night sweats.  She tolerated the last cycle of her treatment fairly well.  She is here today for evaluation before starting cycle #11 of her chemotherapy. ? ?MEDICAL HISTORY: ?Past Medical History:  ?Diagnosis Date  ? Anemia   ? Anxiety   ? Arthritis   ? Asthma   ? exercise induced  ? Depression   ? PMH  ? Dyspnea   ? GERD (gastroesophageal reflux disease)   ? Glaucoma   ? History of radiation therapy 01/05/2021  ? IMRT right lung  11/24/2020-01/05/2021  Dr [name]  ? Hypertension   ? lung ca dx'd 11/2017  ? right  ? Malignant pleural effusion   ? right  ? Nuclear sclerotic cataract of right eye 02/28/2021  ? Dr. Warden Fillers, cataract surgery February 2023  ? PONV (postoperative nausea and vomiting)   ? Pre-diabetes   ? Raynaud's disease   ? Raynaud's disease   ? Stroke [facility]) 01/2021  ? balance off, some express aphasia, weakness  ? ? ?ALLERGIES:  is allergic to penicillins, vicodin [hydrocodone-acetaminophen], and other. ? ?MEDICATIONS:  ?Current Outpatient Medications  ?Medication Sig Dispense Refill  ? acetaminophen (TYLENOL) 500 MG tablet Take 1,000 mg by mouth every 6 (six) hours as needed (pain).    ? ALPRAZolam (XANAX) 0.25 MG tablet Take 1 tablet (0.25 mg total) by mouth 3 (three) times daily as needed for anxiety. May take 2 tablets (0.5 mg total) at bedtime. 60 tablet 0  ? aspirin 81 MG  chewable tablet Chew 1 tablet (81 mg total) by mouth daily.    ? brompheniramine-pseudoephedrine-DM 30-2-10 MG/5ML syrup Take 5 mLs by mouth 3 (three) times daily as needed. 120 mL 0  ? carvedilol (COREG) 12.5 MG tablet Take 1 tablet (12.5 mg total) by mouth 2 (two) times daily. 180 tablet 1  ? cetirizine (ZYRTEC) 5 MG tablet Take 5 mg by mouth daily.    ? dexamethasone (DECADRON) 4 MG tablet Take 1 tablet twice a day the day before, the day of, and the day after chemotherapy 40 tablet 2  ? dorzolamide-timolol (COSOPT) 22.3-6.8 MG/ML  ophthalmic solution Place 1 drop into both eyes 2 (two) times daily.    ? dronabinol (MARINOL) 2.5 MG capsule Take 1 capsule (2.5 mg total) by mouth 2 (two) times daily as needed (decreased appetite). 60 capsule 0  ? enoxaparin (LOVENOX) 60 MG/0.6ML injection Inject 0.6 mLs (60 mg total) into the skin every 12 (twelve) hours. (Patient taking differently: Inject 80 mg into the skin daily. 80 mg) 60 mL 1  ? esomeprazole (NEXIUM) 20 MG capsule Take 1 capsule (20 mg total) by mouth in the morning and at bedtime. 60 capsule 2  ? FLUoxetine (PROZAC) 10 MG capsule Take 1 capsule (10 mg total) by mouth daily. 90 capsule 3  ? folic acid (FOLVITE) 1 MG tablet TAKE 1 TABLET BY MOUTH EVERY DAY 90 tablet 1  ? hyoscyamine (LEVSIN SL) 0.125 MG SL tablet Place 2 tablets (0.25 mg total) under the tongue every 6 (six) hours as needed for cramping. Take 1-2 tablets under the tongue every 6 hours as needed for cramping 30 tablet 0  ? ipratropium-albuterol (DUONEB) 0.5-2.5 (3) MG/3ML SOLN Take 3 mLs by nebulization every 6 (six) hours as needed (Asthma).    ? Iron-FA-B Cmp-C-Biot-Probiotic (FUSION PLUS PO) Take 1 capsule by mouth daily.    ? isosorbide mononitrate (IMDUR) 30 MG 24 hr tablet Take 30 mg by mouth daily.    ? latanoprost (XALATAN) 0.005 % ophthalmic solution Place 1 drop into both eyes at bedtime.     ? ondansetron (ZOFRAN-ODT) 4 MG disintegrating tablet Take 1 tablet (4 mg total) by mouth every 8 (eight) hours as needed for nausea or vomiting. 30 tablet 1  ? osimertinib mesylate (TAGRISSO) 80 MG tablet Take 1 tablet (80 mg total) by mouth daily. 30 tablet 3  ? REPATHA SURECLICK 696 MG/ML SOAJ Inject 140 mg into the skin every 14 (fourteen) days. 6 mL 3  ? ?No current facility-administered medications for this visit.  ? ? ?SURGICAL HISTORY:  ?Past Surgical History:  ?Procedure Laterality Date  ? ABDOMINAL HYSTERECTOMY    ? partial  ? BRONCHIAL BIOPSY  04/21/2021  ? Procedure: BRONCHIAL BIOPSIES;  Surgeon: [name],  DO;  Location: [facility];  Service: Pulmonary;;  ? BRONCHIAL BRUSHINGS  04/21/2021  ? Procedure: BRONCHIAL BRUSHINGS;  Surgeon: [name], DO;  Location: [facility];  Service: Pulmonary;;  ? BRONCHIAL NEEDLE ASPIRATION BIOPSY  04/21/2021  ? Procedure: BRONCHIAL NEEDLE ASPIRATION BIOPSIES;  Surgeon: [name], DO;  Location: [facility];  Service: Pulmonary;;  ? CHEST TUBE INSERTION Right 01/01/2018  ? Procedure: INSERTION PLEURAL DRAINAGE CATHETER;  Surgeon: Ivin Poot, MD;  Location: [facility];  Service: Thoracic;  Laterality: Right;  ? CHEST TUBE INSERTION  04/21/2021  ? Procedure: CHEST TUBE INSERTION;  Surgeon: [name], DO;  Location: [facility];  Service: Pulmonary;;  ? COLONOSCOPY    ? CORONARY STENT INTERVENTION N/A  06/16/2019  ? Procedure: CORONARY STENT INTERVENTION;  Surgeon: Jettie Booze, MD;  Location: [facility];  Service: Cardiovascular;  Laterality: N/A;  ? DILATION AND CURETTAGE OF UTERUS    ? EYE SURGERY    ? due to Glaucoma  ? IR IMAGING GUIDED PORT INSERTION  03/22/2021  ? IR PORT REPAIR CENTRAL VENOUS ACCESS DEVICE  04/15/2021  ? LEFT HEART CATH AND CORONARY ANGIOGRAPHY N/A 06/16/2019  ? Procedure: LEFT HEART CATH AND CORONARY ANGIOGRAPHY;  Surgeon: Jettie Booze, MD;  Location: [facility];  Service: Cardiovascular;  Laterality: N/A;  ? REMOVAL OF PLEURAL DRAINAGE CATHETER Right 11/07/2018  ? Procedure: REMOVAL OF PLEURAL DRAINAGE CATHETER;  Surgeon: Ivin Poot, MD;  Location: [facility];  Service: Thoracic;  Laterality: Right;  ? ROTATOR CUFF REPAIR    ? TUBAL LIGATION    ? VIDEO BRONCHOSCOPY WITH ENDOBRONCHIAL NAVIGATION Bilateral 04/21/2021  ? Procedure: VIDEO BRONCHOSCOPY WITH ENDOBRONCHIAL NAVIGATION;  Surgeon: [name], DO;  Location: [facility];  Service: Pulmonary;  Laterality: Bilateral;  ION  ? WISDOM TOOTH EXTRACTION    ? ? ?REVIEW OF SYSTEMS:  A comprehensive review of systems was negative except for: Constitutional:  positive for fatigue  ? ?PHYSICAL EXAMINATION: General appearance: alert, cooperative, appears stated age, and fatigued ?Head: Normocephalic, without obvious abnormality, atraumatic ?Neck: no adenopathy, no

## 2021-10-21 NOTE — Patient Instructions (Signed)
[facility]  ? Discharge Instructions: ?Thank you for choosing [facility] to provide your oncology and hematology care.  ? ?If you have a lab appointment with the [facility], please go directly to the [facility] and check in at the registration area. ?  ?Wear comfortable clothing and clothing appropriate for easy access to any Portacath or PICC line.  ? ?We strive to give you quality time with your provider. You may need to reschedule your appointment if you arrive late (15 or more minutes).  Arriving late affects you and other patients whose appointments are after yours.  Also, if you miss three or more appointments without notifying the office, you may be dismissed from the clinic at the provider?s discretion.    ?  ?For prescription refill requests, have your pharmacy contact our office and allow 72 hours for refills to be completed.   ? ?Today you received the following chemotherapy and/or immunotherapy agents: Pemetrexed (Alimta)     ?  ?To help prevent nausea and vomiting after your treatment, we encourage you to take your nausea medication as directed. ? ?BELOW ARE SYMPTOMS THAT SHOULD BE REPORTED IMMEDIATELY: ?*FEVER GREATER THAN 100.4 F (38 ?C) OR HIGHER ?*CHILLS OR SWEATING ?*NAUSEA AND VOMITING THAT IS NOT CONTROLLED WITH YOUR NAUSEA MEDICATION ?*UNUSUAL SHORTNESS OF BREATH ?*UNUSUAL BRUISING OR BLEEDING ?*URINARY PROBLEMS (pain or burning when urinating, or frequent urination) ?*BOWEL PROBLEMS (unusual diarrhea, constipation, pain near the anus) ?TENDERNESS IN MOUTH AND THROAT WITH OR WITHOUT PRESENCE OF ULCERS (sore throat, sores in mouth, or a toothache) ?UNUSUAL RASH, SWELLING OR PAIN  ?UNUSUAL VAGINAL DISCHARGE OR ITCHING  ? ?Items with * indicate a potential emergency and should be followed up as soon as possible or go to the Emergency Department if any problems should occur. ? ?Please show the CHEMOTHERAPY ALERT CARD or IMMUNOTHERAPY ALERT CARD at  check-in to the Emergency Department and triage nurse. ? ?Should you have questions after your visit or need to cancel or reschedule your appointment, please contact [facility]  Dept: [phone]  and follow the prompts.  Office hours are 8:00 a.m. to 4:30 p.m. Monday - Friday. Please note that voicemails left after 4:00 p.m. may not be returned until the following business day.  We are closed weekends and major holidays. You have access to a nurse at all times for urgent questions. Please call the main number to the clinic Dept: [phone] and follow the prompts. ? ? ?For any non-urgent questions, you may also contact your provider using MyChart. We now offer e-Visits for anyone 71 and older to request care online for non-urgent symptoms. For details visit [email or web address]. ?  ?Also download the MyChart app! Go to the app store, search "MyChart", open the app, select [facility], and log in with your MyChart username and password. ? ?Due to Covid, a mask is required upon entering the hospital/clinic. If you do not have a mask, one will be given to you upon arrival. For doctor visits, patients may have 1 support person aged 71 or older with them. For treatment visits, patients cannot have anyone with them due to current Covid guidelines and our immunocompromised population.  ? ?

## 2021-10-25 ENCOUNTER — Other Ambulatory Visit: Payer: Medicare HMO | Admitting: Internal Medicine

## 2021-10-25 ENCOUNTER — Encounter: Payer: Self-pay | Admitting: Internal Medicine

## 2021-10-25 VITALS — BP 138/62

## 2021-10-25 DIAGNOSIS — K117 Disturbances of salivary secretion: Secondary | ICD-10-CM | POA: Diagnosis not present

## 2021-10-25 DIAGNOSIS — C3491 Malignant neoplasm of unspecified part of right bronchus or lung: Secondary | ICD-10-CM | POA: Diagnosis not present

## 2021-10-25 DIAGNOSIS — E43 Unspecified severe protein-calorie malnutrition: Secondary | ICD-10-CM

## 2021-10-25 DIAGNOSIS — H35371 Puckering of macula, right eye: Secondary | ICD-10-CM | POA: Diagnosis not present

## 2021-10-25 DIAGNOSIS — Z515 Encounter for palliative care: Secondary | ICD-10-CM

## 2021-10-25 DIAGNOSIS — F411 Generalized anxiety disorder: Secondary | ICD-10-CM

## 2021-10-25 DIAGNOSIS — R69 Illness, unspecified: Secondary | ICD-10-CM | POA: Diagnosis not present

## 2021-10-25 NOTE — Progress Notes (Signed)
[facility] Visit Telephone: [phone]  Fax: [phone]   Date of encounter: 10/25/21 11:38 AM PATIENT NAME: Allison [name] [place] [id number]   [phone] (home)  DOB: [birth year] MRN: [id number] PRIMARY CARE PROVIDER:    [name], MD,  [address] [place] [address] [phone]  REFERRING PROVIDER:   Willey [name], [place],  [place] [address] [phone]  RESPONSIBLE PARTY:    Contact Information     Name Relation Home Work Allison [name] George [phone]  [phone]   Allison [name] Daughter [phone]  [phone]   Allison [name] Daughter [phone]  [phone]   Allison [name] Daughter [phone]  [phone]        I met face to face with patient and family in her home. Palliative Care was asked to follow this patient by consultation request of  Allison Blade, MD to address advance care planning and complex medical decision making. This is follow-up visit.                                     ASSESSMENT AND PLAN / RECOMMENDATIONS:   Advance Care Planning/Goals of Care: Goals include to maximize quality of life and symptom management. Patient/health care surrogate gave his/her permission to discuss.Our advance care planning conversation included a discussion about:    The value and importance of advance care planning  Experiences with loved ones who have been seriously ill or have died  Exploration of personal, cultural or spiritual beliefs that might influence medical decisions  Exploration of goals of care in the event of a sudden injury or illness  Identification  of a healthcare agent  Review and updating or creation of an  advance directive document . Decision not to resuscitate or to de-escalate disease focused treatments due to poor prognosis. CODE STATUS:  MOST:   DNR, limited, abx if indicated, IVF trial, tube feeding trial (only if for another complication from which she may recover, not at end stage of her lung cancer--would not want then)  Symptom Management/Plan: 1. Generalized anxiety disorder -advised to increase prozac from 104m to 22mpo daily and monitor -continue xanax as previously prescribed for anxiety and sob   2. Adenocarcinoma of right lung, stage 4 (HCC) -has not had any new lesions identified-has stage IV (T2a, N2, M1a) NSCLC adenoca diagnosed July 2019 with RUL mass and mediastinal lymphadenopathy plus bilateral pulmonary nodules and malignant right pleural effusion (Dr. VaDarcey [name]-previously has had right pleurx, palliative XRT to RUL mass and mediastinum (Dr. KiSondra [name] 7/22), tagrisso 8053mo daily starting 01/29/18 (currently s/p 10 cycles), chemo with carboplatin for AUC of 5 and alimta 500m18m every 3 wks starting in 02/2021, but on alimta only since cycle 7. -continues on chemo regimen with per Dr. MohaJulien [name] above) -we continue to follow her with palliative to address her symptoms and she is also followed at [facility]  3. Increased oropharyngeal secretions -continue vest, nebs when needed, also may use cough syrups and IS, pickle devices  4. Protein-calorie malnutrition, severe -less of a struggle recently by her report since being on alimta alone, continue shakes, foods she enjoys and tolerates   5. Macular pucker, right eye -requests a second opinion for this as she'd like to be able to see better, but surgical option provided is not  appealing to her after past experience with cataract surgery--she may consider seeing Dr. Coralyn [name]  6. Palliative care encounter -updated med list to current state -reviewed MOST which remains up to date to current wishes  -pt to spend a few days in [place] with her boyfriend and her daughers are helping her pack so this will be a success -encouraged stopping  each hour to take a break with h/o DVT on anticoagulation and simply due to not being used to prolonged time in the car -she's going to take covid precautions seriously, as well with masking and distancing--plans to mainly sit on the balcony and enjoy the sound and sight of the ocean  Follow up Palliative Care Visit: Palliative care will continue to follow for complex medical decision making, advance care planning, and clarification of goals. Return 12/06/2021 and prn.  This visit was coded based on medical decision making (MDM). 20 mins spent on acp  PPS: 50%  HOSPICE ELIGIBILITY/DIAGNOSIS: TBD  Chief Complaint: Follow-up palliative visit  HISTORY OF PRESENT ILLNESS:  Allison [name] is a 71 y.o. year old female  with metastatic lung adenoca to mediastinum and pleura with EGFR mutation,  h/o NSTEMI with CAD s/p stent, gerd, GAD, Raynaud's, protein calorie malnutrition, PE and DVT, prior stroke, cataracts and now macular pucker, anemia among others seen for palliative care f/u.  In the daytime, her xanax 1 tablet is not doing the trick the way it once did.  She has a pressure in her chest which does not go away.    She really wants speech therapy, but must have PT to get it.  She wants her speech to be better.  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  Says her speech is not connecting and she has to wait for the words to come out properly.  She repeats herself sometimes.  She thinks she does that as she waits for what she wants to come out.  That stresses her out, too.  She gets frustrated.    She goes out in the nice weather, looks at the other plots in the garden--Allison [name] has been working on their garden and the back deck.  Taking zyrtec but it doesn't help much with her drippy nose.    She's doing better on one chemo vs two.  She's to have another scan before Dr. Julien [name] sees her on 5/25.  She just had a chemo on 5/5 and the first  3-4 days are really fatiguing, rough.    Appetite is really good vs before--able to force herself to eat.    She is using the vest over her back, but not around her arms b/c that made her chest sore over her port.  It's great about helping her phlegm.  Doesn't need nebs as much.  Still gargles.    She's going to [facility] with [facility] for 4 days/3 nights.  Her children are concerned.  Drive hour at a time and take break.  She plans to sit on the balcony and listen to the waves.    Fatigue is her main bother.    She is now a great grandma--Lou Opal Sidles was born [birth year]20.  She's seen her twice.   She had blood due to significant anemia--1 unit and two shots for her wbc being low.  WBC was 1.2 but went up to 2.9.  hgb was 8.4 to 10.8.  Platelets were 216 up from 107.  Her right eye has a macular pucker.  She wants to see and drive, but she's not anxious to have another procedure.  She thinks it is affecting her balance, too.  Dr. Zadie Rhine is her retina specialist.  It got worse from sept to April.  He also said he wouldn't do surgery if he were in her shoes.  Doesn't want to go through a prolonged recovery with her eye pressure going up.    Sips on ensure so one to two per week only.  Eats high protein yogurt.  She has a whole bucket of strawberries.  Eating yogurt with strawberries, blueberries and granola.  Mouth doesn't taste so bad.  Is not a domestic person--doesn't like cooking and keeping up the house.    She has a high school class lunch get together coming up on 5/11 to eat.  She's going to wear her mask and be safe.  Creatinine had been up to 1.21 also so she had a bag of fluids.  Potassium was mildly low again, too.  History obtained from review of EMR, discussion with primary team, and interview with family, facility staff/caregiver and/or [name].  I reviewed available labs, medications, imaging, studies and related documents from the EMR.  Records reviewed and summarized above.    ROS General: NAD EYES: reports right eye remains blurry ENMT: denies dysphagia Cardiovascular: denies chest pain much at all since using vest for secretions, has some DOE Pulmonary: has some cough, denies increased SOB vs prior Abdomen: endorses slightly improved appetite with just one chemo agent, denies constipation, endorses continence of bowel GU: denies dysuria, endorses continence of urine MSK:  denies increased weakness,  no falls reported Skin: denies rashes or wounds Neurological: denies pain, denies insomnia, no syncopal episodes Psych: Endorses positive mood Heme/lymph/immuno: denies bruises, abnormal bleeding  Physical Exam: Current and past weights: 116 lbs since April.  Up slightly but had clothes on.   Constitutional: NAD General: frail appearing, thin, hair thinning EYES: anicteric sclera, lids intact, no discharge  ENMT: intact hearing, oral mucous membranes moist, dentition intact CV: S1S2, RRR, nonpitting edema present Pulmonary: no increased work of breathing, some productive cough of clear sputum, room air Abdomen: intake 50%, normo-active BS + 4 quadrants, soft and non tender, no ascites GU: deferred MSK:  sarcopenia, moves all extremities, ambulatory w/o assistive device but has them if needed Skin: warm and dry, no rashes or wounds on visible skin Neuro:  generalized weakness,  no cognitive impairment Psych: anxious affect and a bit tremulous today, A and O x 3 Hem/lymph/immuno: no widespread bruising  CURRENT PROBLEM LIST:  Patient Active Problem List   Diagnosis Date Noted   Anemia 10/07/2021   Neutropenia ([place]) 10/07/2021   Pseudophakia of right eye 09/22/2021   Port-A-Cath in place 07/07/2021   Protein-calorie malnutrition, severe 04/25/2021   Pneumothorax after biopsy 04/21/2021   Lung nodule 04/01/2021   Epiretinal membrane, right eye 02/28/2021   Nuclear sclerotic cataract of left eye 02/28/2021   Coronary artery disease of native artery of  native heart with stable angina pectoris ([place])    CVA (cerebral vascular accident) ([place]) 02/12/2021   Current use of long term anticoagulation 02/12/2021   Hypokalemia 02/12/2021   Leg DVT (deep venous thromboembolism), acute, bilateral ([place]) 02/08/2021   Aortic atherosclerosis ([place]) 02/08/2021   Respiratory failure ([place]) 02/08/2021   Postobstructive pneumonia 02/07/2021   Pulmonary embolism ([place]) 02/07/2021   Elevated troponin 02/07/2021   Encounter to establish care 02/02/2021   Shortness  of breath 02/02/2021   Cough 02/02/2021   Statin myopathy 12/03/2020   Acute non-recurrent frontal sinusitis 11/18/2020   NSCLC with EGFR mutation ([place]) 06/30/2020   Hx of chest tube placement 02/11/2020   History of chest tube placement 11/12/2019   Abnormal liver function 08/20/2019   Gastroesophageal reflux disease without esophagitis 08/20/2019   Generalized anxiety disorder 08/20/2019   Glaucoma 08/20/2019   Hyperlipidemia 08/20/2019   Menopausal syndrome 08/20/2019   Raynaud's phenomenon 08/20/2019   Vitamin D deficiency 08/20/2019   Status post coronary artery stent placement    History of non-ST elevation myocardial infarction (NSTEMI) 06/12/2019   Hypertension 03/24/2019   Encounter for antineoplastic chemotherapy 01/22/2018   Adenocarcinoma of right lung, stage 4 ([place]) 01/04/2018   Goals of care, counseling/discussion 01/04/2018   Thyroid nodule 12/11/2017   Pleural effusion, right 12/11/2017   Insomnia 04/20/2014   PAST MEDICAL HISTORY:  Active Ambulatory Problems    Diagnosis Date Noted   Thyroid nodule 12/11/2017   Pleural effusion, right 12/11/2017   Adenocarcinoma of right lung, stage 4 ([place]) 01/04/2018   Goals of care, counseling/discussion 01/04/2018   Encounter for antineoplastic chemotherapy 01/22/2018   Hypertension 03/24/2019   History of non-ST elevation myocardial infarction (NSTEMI) 06/12/2019   Status post coronary artery stent placement    Abnormal liver  function 08/20/2019   Gastroesophageal reflux disease without esophagitis 08/20/2019   Generalized anxiety disorder 08/20/2019   Glaucoma 08/20/2019   Hyperlipidemia 08/20/2019   Insomnia 04/20/2014   Menopausal syndrome 08/20/2019   Raynaud's phenomenon 08/20/2019   Vitamin D deficiency 08/20/2019   History of chest tube placement 11/12/2019   Hx of chest tube placement 02/11/2020   NSCLC with EGFR mutation ([place]) 06/30/2020   Acute non-recurrent frontal sinusitis 11/18/2020   Statin myopathy 12/03/2020   Encounter to establish care 02/02/2021   Shortness of breath 02/02/2021   Cough 02/02/2021   Postobstructive pneumonia 02/07/2021   Pulmonary embolism ([place]) 02/07/2021   Elevated troponin 02/07/2021   Leg DVT (deep venous thromboembolism), acute, bilateral ([place]) 02/08/2021   Aortic atherosclerosis ([place]) 02/08/2021   Respiratory failure ([place]) 02/08/2021   CVA (cerebral vascular accident) ([place]) 02/12/2021   Current use of long term anticoagulation 02/12/2021   Hypokalemia 02/12/2021   Coronary artery disease of native artery of native heart with stable angina pectoris ([place])    Epiretinal membrane, right eye 02/28/2021   Nuclear sclerotic cataract of left eye 02/28/2021   Lung nodule 04/01/2021   Pneumothorax after biopsy 04/21/2021   Protein-calorie malnutrition, severe 04/25/2021   Port-A-Cath in place 07/07/2021   Pseudophakia of right eye 09/22/2021   Anemia 10/07/2021   Neutropenia ([place]) 10/07/2021   Resolved Ambulatory Problems    Diagnosis Date Noted   Mass of upper lobe of right lung 12/10/2016   Pulmonary nodules 12/11/2017   Hypokalemia    History of precordial chest pain 07/16/2019   Nuclear sclerotic cataract of right eye 02/28/2021   Past Medical History:  Diagnosis Date   Anxiety    Arthritis    Asthma    Depression    Dyspnea    GERD (gastroesophageal reflux disease)    History of radiation therapy 01/05/2021   lung ca dx'd 11/2017   Malignant  pleural effusion    PONV (postoperative nausea and vomiting)    Pre-diabetes    Raynaud's disease    Raynaud's disease    Stroke ([place]) 01/2021   SOCIAL HX:  Social History   Tobacco Use   Smoking status:  Never   Smokeless tobacco: Never  Substance Use Topics   Alcohol use: Not Currently    Comment: up to 3 drinks per week     ALLERGIES:  Allergies  Allergen Reactions   Penicillins Other (See Comments)    [place] PATIENT HAS HAD A PCN REACTION WITH IMMEDIATE RASH, FACIAL/TONGUE/THROAT SWELLING, SOB, OR LIGHTHEADEDNESS WITH HYPOTENSION:  #  #  YES  #  # Has patient had a PCN reaction causing severe rash involving mucus membranes or skin necrosis: No Has patient had a PCN reaction that required hospitalization: No Has patient had a PCN reaction occurring within the last 10 years: No    Vicodin [Hydrocodone-Acetaminophen] Other (See Comments)    Sped up heart and breathing   Other Other (See Comments)    Glaucoma eye drop - turned eyes dark     PERTINENT MEDICATIONS:  Outpatient Encounter Medications as of 10/25/2021  Medication Sig   acetaminophen (TYLENOL) 500 MG tablet Take 1,000 mg by mouth every 6 (six) hours as needed (pain).   ALPRAZolam (XANAX) 0.25 MG tablet Take 1 tablet (0.25 mg total) by mouth 3 (three) times daily as needed for anxiety. May take 2 tablets (0.5 mg total) at bedtime.   aspirin 81 MG chewable tablet Chew 1 tablet (81 mg total) by mouth daily.   brompheniramine-pseudoephedrine-DM 30-2-10 MG/5ML syrup Take 5 mLs by mouth 3 (three) times daily as needed.   carvedilol (COREG) 12.5 MG tablet Take 1 tablet (12.5 mg total) by mouth 2 (two) times daily.   cetirizine (ZYRTEC) 5 MG tablet Take 5 mg by mouth daily.   dexamethasone (DECADRON) 4 MG tablet Take 1 tablet twice a day the day before, the day of, and the day after chemotherapy   dorzolamide-timolol (COSOPT) 22.3-6.8 MG/ML ophthalmic solution Place 1 drop into both eyes 2 (two) times daily.   dronabinol  (MARINOL) 2.5 MG capsule Take 1 capsule (2.5 mg total) by mouth 2 (two) times daily as needed (decreased appetite).   enoxaparin (LOVENOX) 60 MG/0.6ML injection Inject 0.6 mLs (60 mg total) into the skin every 12 (twelve) hours. (Patient taking differently: Inject 80 mg into the skin daily. 80 mg)   esomeprazole (NEXIUM) 20 MG capsule Take 1 capsule (20 mg total) by mouth in the morning and at bedtime.   FLUoxetine (PROZAC) 10 MG capsule Take 1 capsule (10 mg total) by mouth daily.   folic acid (FOLVITE) 1 MG tablet TAKE 1 TABLET BY MOUTH EVERY DAY   hyoscyamine (LEVSIN SL) 0.125 MG SL tablet Place 2 tablets (0.25 mg total) under the tongue every 6 (six) hours as needed for cramping. Take 1-2 tablets under the tongue every 6 hours as needed for cramping   ipratropium-albuterol (DUONEB) 0.5-2.5 (3) MG/3ML SOLN Take 3 mLs by nebulization every 6 (six) hours as needed (Asthma).   Iron-FA-B Cmp-C-Biot-Probiotic (FUSION PLUS PO) Take 1 capsule by mouth daily.   isosorbide mononitrate (IMDUR) 30 MG 24 hr tablet Take 30 mg by mouth daily.   latanoprost (XALATAN) 0.005 % ophthalmic solution Place 1 drop into both eyes at bedtime.    ondansetron (ZOFRAN-ODT) 4 MG disintegrating tablet Take 1 tablet (4 mg total) by mouth every 8 (eight) hours as needed for nausea or vomiting.   osimertinib mesylate (TAGRISSO) 80 MG tablet Take 1 tablet (80 mg total) by mouth daily.   REPATHA SURECLICK 960 MG/ML SOAJ Inject 140 mg into the skin every 14 (fourteen) days.   No facility-administered encounter medications on file as of  10/25/2021.   Thank you for the opportunity to participate in the care of [name].  The palliative care team will continue to follow. Please call our office at [phone] if we can be of additional assistance.   [name], DO  COVID-19 PATIENT SCREENING TOOL Asked and negative response unless otherwise noted:  Have you had symptoms of covid, tested positive or been in contact with  someone with symptoms/positive test in the past 5-10 days? no

## 2021-10-26 ENCOUNTER — Telehealth: Payer: Self-pay | Admitting: Adult Health

## 2021-10-26 NOTE — Telephone Encounter (Signed)
FYI

## 2021-10-26 NOTE — Telephone Encounter (Signed)
Allison [name]) notify neurologist pt only want to have speech therapy. Can use the referral already have. ?

## 2021-10-27 ENCOUNTER — Telehealth: Payer: Self-pay | Admitting: Adult Health

## 2021-10-27 DIAGNOSIS — G4700 Insomnia, unspecified: Secondary | ICD-10-CM | POA: Diagnosis not present

## 2021-10-27 DIAGNOSIS — I10 Essential (primary) hypertension: Secondary | ICD-10-CM | POA: Diagnosis not present

## 2021-10-27 DIAGNOSIS — H2512 Age-related nuclear cataract, left eye: Secondary | ICD-10-CM | POA: Diagnosis not present

## 2021-10-27 DIAGNOSIS — I69328 Other speech and language deficits following cerebral infarction: Secondary | ICD-10-CM | POA: Diagnosis not present

## 2021-10-27 DIAGNOSIS — I639 Cerebral infarction, unspecified: Secondary | ICD-10-CM | POA: Diagnosis not present

## 2021-10-27 DIAGNOSIS — I252 Old myocardial infarction: Secondary | ICD-10-CM | POA: Diagnosis not present

## 2021-10-27 DIAGNOSIS — C3491 Malignant neoplasm of unspecified part of right bronchus or lung: Secondary | ICD-10-CM | POA: Diagnosis not present

## 2021-10-27 DIAGNOSIS — I82403 Acute embolism and thrombosis of unspecified deep veins of lower extremity, bilateral: Secondary | ICD-10-CM | POA: Diagnosis not present

## 2021-10-27 DIAGNOSIS — Z8701 Personal history of pneumonia (recurrent): Secondary | ICD-10-CM | POA: Diagnosis not present

## 2021-10-27 DIAGNOSIS — E876 Hypokalemia: Secondary | ICD-10-CM | POA: Diagnosis not present

## 2021-10-27 DIAGNOSIS — H409 Unspecified glaucoma: Secondary | ICD-10-CM | POA: Diagnosis not present

## 2021-10-27 DIAGNOSIS — E785 Hyperlipidemia, unspecified: Secondary | ICD-10-CM | POA: Diagnosis not present

## 2021-10-27 DIAGNOSIS — K219 Gastro-esophageal reflux disease without esophagitis: Secondary | ICD-10-CM | POA: Diagnosis not present

## 2021-10-27 DIAGNOSIS — I2510 Atherosclerotic heart disease of native coronary artery without angina pectoris: Secondary | ICD-10-CM | POA: Diagnosis not present

## 2021-10-27 DIAGNOSIS — E559 Vitamin D deficiency, unspecified: Secondary | ICD-10-CM | POA: Diagnosis not present

## 2021-10-27 DIAGNOSIS — I7300 Raynaud's syndrome without gangrene: Secondary | ICD-10-CM | POA: Diagnosis not present

## 2021-10-27 DIAGNOSIS — R69 Illness, unspecified: Secondary | ICD-10-CM | POA: Diagnosis not present

## 2021-10-27 DIAGNOSIS — I2699 Other pulmonary embolism without acute cor pulmonale: Secondary | ICD-10-CM | POA: Diagnosis not present

## 2021-10-27 NOTE — Telephone Encounter (Signed)
I called Allison [name] back and provided verbal order as requested. She verbalized understanding and appreciation for the call.  ?

## 2021-10-27 NOTE — Telephone Encounter (Signed)
Speech Therapist Anna Genre @ [facility] is requesting verbal orders for 1 week 6 her call back # is [phone] with secure vm ?

## 2021-11-06 DIAGNOSIS — J479 Bronchiectasis, uncomplicated: Secondary | ICD-10-CM | POA: Diagnosis not present

## 2021-11-07 ENCOUNTER — Other Ambulatory Visit: Payer: Self-pay

## 2021-11-07 ENCOUNTER — Ambulatory Visit (HOSPITAL_BASED_OUTPATIENT_CLINIC_OR_DEPARTMENT_OTHER)
Admission: RE | Admit: 2021-11-07 | Discharge: 2021-11-07 | Disposition: A | Discharge status: Home / Self Care | Payer: Medicare HMO | Source: Ambulatory Visit | Attending: Internal Medicine | Admitting: Internal Medicine

## 2021-11-07 DIAGNOSIS — C3491 Malignant neoplasm of unspecified part of right bronchus or lung: Secondary | ICD-10-CM

## 2021-11-07 DIAGNOSIS — C3490 Malignant neoplasm of unspecified part of unspecified bronchus or lung: Secondary | ICD-10-CM | POA: Insufficient documentation

## 2021-11-07 DIAGNOSIS — I700 Atherosclerosis of aorta: Secondary | ICD-10-CM | POA: Diagnosis not present

## 2021-11-07 MED ORDER — HEPARIN SOD (PORK) LOCK FLUSH 100 UNIT/ML IV SOLN
500.0000 [IU] | Freq: Once | INTRAVENOUS | Status: AC
  Administered 2021-11-07: 500 [IU] via INTRAVENOUS

## 2021-11-07 MED ORDER — IOHEXOL 300 MG/ML  SOLN
65.0000 mL | Freq: Once | INTRAMUSCULAR | Status: AC | PRN
Start: 2021-11-07 — End: 2021-11-07
  Administered 2021-11-07: 65 mL via INTRAVENOUS

## 2021-11-07 MED ORDER — OSIMERTINIB MESYLATE 80 MG PO TABS: 80.0000 mg | ORAL_TABLET | Freq: Every day | ORAL | 3 refills | Status: DC

## 2021-11-10 ENCOUNTER — Inpatient Hospital Stay (HOSPITAL_BASED_OUTPATIENT_CLINIC_OR_DEPARTMENT_OTHER): Payer: Medicare HMO | Admitting: Nurse Practitioner

## 2021-11-10 ENCOUNTER — Inpatient Hospital Stay: Payer: Medicare HMO

## 2021-11-10 ENCOUNTER — Encounter: Payer: Self-pay | Admitting: General Practice

## 2021-11-10 ENCOUNTER — Inpatient Hospital Stay: Payer: Medicare HMO | Admitting: Internal Medicine

## 2021-11-10 ENCOUNTER — Ambulatory Visit: Payer: Medicare HMO | Admitting: Internal Medicine

## 2021-11-10 ENCOUNTER — Ambulatory Visit: Payer: Medicare HMO

## 2021-11-10 ENCOUNTER — Other Ambulatory Visit: Payer: Medicare HMO

## 2021-11-10 ENCOUNTER — Other Ambulatory Visit: Payer: Self-pay

## 2021-11-10 ENCOUNTER — Encounter: Payer: Self-pay | Admitting: Nurse Practitioner

## 2021-11-10 VITALS — BP 151/63 | HR 75 | Temp 97.1°F | Resp 17 | Wt 118.4 lb

## 2021-11-10 DIAGNOSIS — Z66 Do not resuscitate: Secondary | ICD-10-CM | POA: Diagnosis not present

## 2021-11-10 DIAGNOSIS — R5383 Other fatigue: Secondary | ICD-10-CM | POA: Diagnosis not present

## 2021-11-10 DIAGNOSIS — F419 Anxiety disorder, unspecified: Secondary | ICD-10-CM | POA: Diagnosis not present

## 2021-11-10 DIAGNOSIS — C3491 Malignant neoplasm of unspecified part of right bronchus or lung: Secondary | ICD-10-CM

## 2021-11-10 DIAGNOSIS — Z79899 Other long term (current) drug therapy: Secondary | ICD-10-CM | POA: Diagnosis not present

## 2021-11-10 DIAGNOSIS — Z95828 Presence of other vascular implants and grafts: Secondary | ICD-10-CM

## 2021-11-10 DIAGNOSIS — C3490 Malignant neoplasm of unspecified part of unspecified bronchus or lung: Secondary | ICD-10-CM

## 2021-11-10 DIAGNOSIS — R634 Abnormal weight loss: Secondary | ICD-10-CM | POA: Diagnosis not present

## 2021-11-10 DIAGNOSIS — Z923 Personal history of irradiation: Secondary | ICD-10-CM | POA: Diagnosis not present

## 2021-11-10 DIAGNOSIS — C771 Secondary and unspecified malignant neoplasm of intrathoracic lymph nodes: Secondary | ICD-10-CM | POA: Diagnosis not present

## 2021-11-10 DIAGNOSIS — Z86718 Personal history of other venous thrombosis and embolism: Secondary | ICD-10-CM | POA: Diagnosis not present

## 2021-11-10 DIAGNOSIS — J910 Malignant pleural effusion: Secondary | ICD-10-CM | POA: Diagnosis not present

## 2021-11-10 DIAGNOSIS — Z7901 Long term (current) use of anticoagulants: Secondary | ICD-10-CM | POA: Diagnosis not present

## 2021-11-10 DIAGNOSIS — C3411 Malignant neoplasm of upper lobe, right bronchus or lung: Secondary | ICD-10-CM | POA: Diagnosis not present

## 2021-11-10 DIAGNOSIS — R0609 Other forms of dyspnea: Secondary | ICD-10-CM | POA: Diagnosis not present

## 2021-11-10 DIAGNOSIS — Z8673 Personal history of transient ischemic attack (TIA), and cerebral infarction without residual deficits: Secondary | ICD-10-CM | POA: Diagnosis not present

## 2021-11-10 DIAGNOSIS — Z5111 Encounter for antineoplastic chemotherapy: Secondary | ICD-10-CM

## 2021-11-10 DIAGNOSIS — C7800 Secondary malignant neoplasm of unspecified lung: Secondary | ICD-10-CM | POA: Diagnosis not present

## 2021-11-10 DIAGNOSIS — Z515 Encounter for palliative care: Secondary | ICD-10-CM

## 2021-11-10 DIAGNOSIS — R69 Illness, unspecified: Secondary | ICD-10-CM | POA: Diagnosis not present

## 2021-11-10 LAB — CBC WITH DIFFERENTIAL (CANCER CENTER ONLY)
Abs Immature Granulocytes: 0.01 10*3/uL (ref 0.00–0.07)
Basophils Absolute: 0 10*3/uL (ref 0.0–0.1)
Basophils Relative: 0 %
Eosinophils Absolute: 0 10*3/uL (ref 0.0–0.5)
Eosinophils Relative: 0 %
HCT: 31.1 % — ABNORMAL LOW (ref 36.0–46.0)
Hemoglobin: 10.1 g/dL — ABNORMAL LOW (ref 12.0–15.0)
Immature Granulocytes: 0 %
Lymphocytes Relative: 10 %
Lymphs Abs: 0.6 10*3/uL — ABNORMAL LOW (ref 0.7–4.0)
MCH: 29.3 pg (ref 26.0–34.0)
MCHC: 32.5 g/dL (ref 30.0–36.0)
MCV: 90.1 fL (ref 80.0–100.0)
Monocytes Absolute: 0.2 10*3/uL (ref 0.1–1.0)
Monocytes Relative: 3 %
Neutro Abs: 5.3 10*3/uL (ref 1.7–7.7)
Neutrophils Relative %: 87 %
Platelet Count: 195 10*3/uL (ref 150–400)
RBC: 3.45 MIL/uL — ABNORMAL LOW (ref 3.87–5.11)
RDW: 13.1 % (ref 11.5–15.5)
WBC Count: 6.1 10*3/uL (ref 4.0–10.5)
nRBC: 0 % (ref 0.0–0.2)

## 2021-11-10 LAB — CMP (CANCER CENTER ONLY)
ALT: 66 U/L — ABNORMAL HIGH (ref 0–44)
AST: 49 U/L — ABNORMAL HIGH (ref 15–41)
Albumin: 4 g/dL (ref 3.5–5.0)
Alkaline Phosphatase: 67 U/L (ref 38–126)
Anion gap: 7 (ref 5–15)
BUN: 17 mg/dL (ref 8–23)
CO2: 29 mmol/L (ref 22–32)
Calcium: 9.4 mg/dL (ref 8.9–10.3)
Chloride: 103 mmol/L (ref 98–111)
Creatinine: 1.08 mg/dL — ABNORMAL HIGH (ref 0.44–1.00)
GFR, Estimated: 55 mL/min — ABNORMAL LOW (ref >60)
Glucose, Bld: 116 mg/dL — ABNORMAL HIGH (ref 70–99)
Potassium: 3.5 mmol/L (ref 3.5–5.1)
Sodium: 139 mmol/L (ref 135–145)
Total Bilirubin: 0.2 mg/dL — ABNORMAL LOW (ref 0.3–1.2)
Total Protein: 7.2 g/dL (ref 6.5–8.1)

## 2021-11-10 LAB — SAMPLE TO BLOOD BANK

## 2021-11-10 MED ORDER — SODIUM CHLORIDE 0.9 % IV SOLN
400.0000 mg/m2 | Freq: Once | INTRAVENOUS | Status: AC
  Administered 2021-11-10: 600 mg via INTRAVENOUS
  Filled 2021-11-10: qty 20

## 2021-11-10 MED ORDER — SODIUM CHLORIDE 0.9% FLUSH
10.0000 mL | Freq: Once | INTRAVENOUS | Status: AC
  Administered 2021-11-10: 10 mL

## 2021-11-10 MED ORDER — ONDANSETRON HCL 4 MG/2ML IJ SOLN
8.0000 mg | Freq: Once | INTRAMUSCULAR | Status: AC
  Administered 2021-11-10: 8 mg via INTRAVENOUS
  Filled 2021-11-10: qty 4

## 2021-11-10 MED ORDER — SODIUM CHLORIDE 0.9 % IV SOLN: Freq: Once | INTRAVENOUS | Status: AC

## 2021-11-10 NOTE — Progress Notes (Signed)
[facility] Spiritual Care Note  Follow Allison [name] through [facility] and visited in infusion today, providing pastoral presence, empathic listening, emotional support, mindfulness tools, and prayer per request. Also per request, sent a follow-up email recapping the tools we discussed, including the 5-4-3-2-1 Grounding Technique, the [place] for the [employer], Calm app for mindfulness and meditation, and practicing gratitude/recalling pleasant experiences as a way to boost neuroplasticity/to train the brain to be happier.  We plan to follow up through group and in infusion, and Allison [name] knows how to reach chaplain as needed/desired in the meantime as well.   [place], [place], [facility] Pager [phone] Voicemail [phone]

## 2021-11-10 NOTE — Progress Notes (Signed)
[place]  Telephone:(336) [phone] [phone]   Name: Allison [name] Date: 11/10/2021 MRN: [id number]  DOB: [birth year]  Patient Care Team: [name], MD as PCP - General (Internal Medicine) Donato Heinz, MD as PCP - Cardiology (Cardiology) [name], MD as Consulting Physician (Radiation Oncology) Pickenpack-Cousar, Carlena Sax, NP as Nurse Practitioner (Nurse Practitioner) Valrie Hart, RN as Oncology Nurse Navigator (Oncology) Curt Bears, MD as Consulting Physician (Oncology) Gayland Curry, DO (Geriatric Medicine)    INTERVAL HISTORY: Allison [name] is a 71 y.o. female with stage IV non-small cell lung cancer (12/2017), hypertension, CVA, CAD, DVT/PE (on Lovenox), and GERD.  Palliative ask to see for symptom management and goals of care.  SOCIAL HISTORY:     reports that she has never smoked. She has never used smokeless tobacco. She reports that she does not currently use alcohol. She reports that she does not use drugs.  ADVANCE DIRECTIVES:  None on file   CODE STATUS: DNR  PAST MEDICAL HISTORY: Past Medical History:  Diagnosis Date   Anemia    Anxiety    Arthritis    Asthma    exercise induced   Depression    PMH   Dyspnea    GERD (gastroesophageal reflux disease)    Glaucoma    History of radiation therapy 01/05/2021   IMRT right lung  11/24/2020-01/05/2021  Dr [name]   Hypertension    lung ca dx'd 11/2017   right   Malignant pleural effusion    right   Nuclear sclerotic cataract of right eye 02/28/2021   Dr. Warden Fillers, cataract surgery February 2023   PONV (postoperative nausea and vomiting)    Pre-diabetes    Raynaud's disease    Raynaud's disease    Stroke ([place]) 01/2021   balance off, some express aphasia, weakness    ALLERGIES:  is allergic to penicillins, vicodin [hydrocodone-acetaminophen], and other.  MEDICATIONS:  Current Outpatient Medications   Medication Sig Dispense Refill   acetaminophen (TYLENOL) 500 MG tablet Take 1,000 mg by mouth every 6 (six) hours as needed (pain).     ALPRAZolam (XANAX) 0.25 MG tablet Take 1 tablet (0.25 mg total) by mouth 3 (three) times daily as needed for anxiety. May take 2 tablets (0.5 mg total) at bedtime. 60 tablet 0   aspirin 81 MG chewable tablet Chew 1 tablet (81 mg total) by mouth daily.     brompheniramine-pseudoephedrine-DM 30-2-10 MG/5ML syrup Take 5 mLs by mouth 3 (three) times daily as needed. 120 mL 0   carvedilol (COREG) 12.5 MG tablet Take 1 tablet (12.5 mg total) by mouth 2 (two) times daily. 180 tablet 1   cetirizine (ZYRTEC) 5 MG tablet Take 5 mg by mouth daily.     dexamethasone (DECADRON) 4 MG tablet Take 1 tablet twice a day the day before, the day of, and the day after chemotherapy 40 tablet 2   dorzolamide-timolol (COSOPT) 22.3-6.8 MG/ML ophthalmic solution Place 1 drop into both eyes 2 (two) times daily.     dronabinol (MARINOL) 2.5 MG capsule Take 1 capsule (2.5 mg total) by mouth 2 (two) times daily as needed (decreased appetite). 60 capsule 0   enoxaparin (LOVENOX) 60 MG/0.6ML injection Inject 0.6 mLs (60 mg total) into the skin every 12 (twelve) hours. (Patient taking differently: Inject 80 mg into the skin daily. 80 mg) 60 mL 1   esomeprazole (NEXIUM) 20 MG capsule Take 1 capsule (20 mg total) by  mouth in the morning and at bedtime. 60 capsule 2   FLUoxetine (PROZAC) 10 MG capsule Take 2 capsules (20 mg total) by mouth daily. 90 capsule 3   folic acid (FOLVITE) 1 MG tablet TAKE 1 TABLET BY MOUTH EVERY DAY 90 tablet 1   hyoscyamine (LEVSIN SL) 0.125 MG SL tablet Place 2 tablets (0.25 mg total) under the tongue every 6 (six) hours as needed for cramping. Take 1-2 tablets under the tongue every 6 hours as needed for cramping 30 tablet 0   ipratropium-albuterol (DUONEB) 0.5-2.5 (3) MG/3ML SOLN Take 3 mLs by nebulization every 6 (six) hours as needed (Asthma).     Iron-FA-B  Cmp-C-Biot-Probiotic (FUSION PLUS PO) Take 1 capsule by mouth daily.     isosorbide mononitrate (IMDUR) 30 MG 24 hr tablet Take 30 mg by mouth daily.     latanoprost (XALATAN) 0.005 % ophthalmic solution Place 1 drop into both eyes at bedtime.      ondansetron (ZOFRAN-ODT) 4 MG disintegrating tablet Take 1 tablet (4 mg total) by mouth every 8 (eight) hours as needed for nausea or vomiting. 30 tablet 1   osimertinib mesylate (TAGRISSO) 80 MG tablet Take 1 tablet (80 mg total) by mouth daily. 30 tablet 3   REPATHA SURECLICK 277 MG/ML SOAJ Inject 140 mg into the skin every 14 (fourteen) days. 6 mL 3   No current facility-administered medications for this visit.    VITAL SIGNS: LMP  (LMP Unknown)  There were no vitals filed for this visit.  Estimated body mass index is 20.32 kg/m as calculated from the following:   Height as of 10/21/21: 5\' 4"  (1.626 m).   Weight as of an earlier encounter on 11/10/21: 118 lb 6 oz (53.7 kg).   PERFORMANCE STATUS (ECOG) : 1 - Symptomatic but completely ambulatory   Physical Exam General: NAD, sitting in recliner  Cardiovascular: RRR Pulmonary: clear ant fields Extremities: no edema, no joint deformities Neurological: AAO x3, mood appropriate   IMPRESSION:  I saw Allison [name] during her infusion today. Continues to do well. Also had visit with Allison [name] today. Expressed appreciation in her scan results. No acute distress noted. Also seen by Allison [name] today.  Ambulatory without assistive device.   Fatigue  Improved. Seems to be more situational. Increased fatigue more noticeable in conjunction with her activity levels. Encouraged her to listen to her body and take frequent rest breaks as needed.   Anxiety Continues to share ongoing challenges with anxiety. Some days more than usual. She was seen by Allison [name], Chaplain which she appreciated. Shared relaxation methods and guided imagery. Allison [name] is also followed by [facility] in the home. Allison [name] recently  increased her Prozac to 20 mg daily. She is tolerating well. Notices some improvement. Will continue to take things day by day and practice relaxation and thought directing activities as she and Allison [name] discussed.    Appetite Appetite continues to improve. Improvement in her indigestion. Tolerating the nexium. Education provided on continued increase in protein enriched foods. She is drinking Ensure daily. Discussed consuming greek yogurt, fruits, vegetables, and nuts which she is doing. Also had conversations of how to be creative with protein drinks to provide more versatile options while also aiding in decrease in reflux.   Her weight is up to 118.6 lbs from 115.3 lbs 3/23 , and 113lbs on 3/2. Express her happiness of the gain and is looking forward to hitting 120lbs.    PLAN: Symptoms are well controlled. Appetite is improving.  She is being followed by the dietician.  Nexium twice daily for indigestion. Tolerating well.  Tolerating Prozac. Dosage increased to 20 mg per Allison [name] with [facility].  I will plan to see back in 4-6 weeks in collaboration with her other oncology appointments.    Patient expressed understanding and was in agreement with this plan. She also understands that She can call the clinic at any time with any questions, concerns, or complaints.   Number and complexity of problems addressed: 1 HIGH - 1 or more chronic illnesses with SEVERE exacerbation, progression, or side effects of treatment - advanced cancer, pain. Any controlled substances utilized were prescribed in the context of palliative care.   Time Total: 20 min   Visit consisted of counseling and education dealing with the complex and emotionally intense issues of symptom management and palliative care in the setting of serious and potentially life-threatening illness.Greater than 50%  of this time was spent counseling and coordinating care related to the above assessment and plan.  Alda Lea, AGPCNP-BC   Palliative Medicine Team/[place]

## 2021-11-10 NOTE — Progress Notes (Signed)
[place] Telephone:(336) [phone]   Fax:(336) [phone]  OFFICE PROGRESS NOTE  [name], [place] [address]  DIAGNOSIS: Stage IV (T2 a,N2, M1a) non-small cell lung cancer, adenocarcinoma diagnosed in July 2019 and presented with right upper lobe lung mass in addition to mediastinal lymphadenopathy as well as bilateral pulmonary nodules and malignant right pleural effusion.   Biomarker Findings Microsatellite status - Cannot Be Determined Tumor Mutational Burden - Cannot Be Determined Genomic Findings For a complete list of the genes assayed, please refer to the Appendix. EGFR exon 19 deletion ([id number]) TP53 Y220C 7 Disease relevant genes with no reportable alterations: KRAS, ALK, BRAF, MET, RET, ERBB2, ROS1    PRIOR THERAPY:  1) Status post right Pleurx catheter placement by Dr. Prescott Gum for drainage of malignant right pleural effusion. 2) palliative radiotherapy to the enlarging right upper lobe lung mass and mediastinum under the care of Dr. Sondra Come expected to be completed on January 05, 2021. 3) Tagrisso 80 mg p.o. daily.  First dose was given on 01/29/2018.  Status post 39 months of treatment.   CURRENT THERAPY: Systemic chemotherapy with carboplatin for AUC of 5 and Alimta 500 Mg/M2 every 3 weeks.  First dose 03/17/2021.  The patient will also continue her current treatment with Tagrisso 80 mg p.o. daily.  She is status post 11 cycles. Starting from cycle #7, she will be on Alimta only 500 mg/m2.  INTERVAL HISTORY: Allison [name] 71 y.o. female returns to the clinic today for follow-up visit accompanied by her boyfriend.  The patient is feeling fine today with no concerning complaints except for the baseline fatigue and shortness of breath with exertion.  She has no chest pain, cough or hemoptysis.  She denied having any fever or chills.  She has no nausea, vomiting, diarrhea or constipation.  She has no headache or  visual changes.  She has been tolerating her treatment with chemotherapy fairly well.  The patient is here today for evaluation with repeat CT scan of the chest, abdomen and pelvis for restaging of her disease.  MEDICAL HISTORY: Past Medical History:  Diagnosis Date   Anemia    Anxiety    Arthritis    Asthma    exercise induced   Depression    PMH   Dyspnea    GERD (gastroesophageal reflux disease)    Glaucoma    History of radiation therapy 01/05/2021   IMRT right lung  11/24/2020-01/05/2021  Dr [name]   Hypertension    lung ca dx'd 11/2017   right   Malignant pleural effusion    right   Nuclear sclerotic cataract of right eye 02/28/2021   Dr. Warden Fillers, cataract surgery February 2023   PONV (postoperative nausea and vomiting)    Pre-diabetes    Raynaud's disease    Raynaud's disease    Stroke ([place]) 01/2021   balance off, some express aphasia, weakness    ALLERGIES:  is allergic to penicillins, vicodin [hydrocodone-acetaminophen], and other.  MEDICATIONS:  Current Outpatient Medications  Medication Sig Dispense Refill   acetaminophen (TYLENOL) 500 MG tablet Take 1,000 mg by mouth every 6 (six) hours as needed (pain).     ALPRAZolam (XANAX) 0.25 MG tablet Take 1 tablet (0.25 mg total) by mouth 3 (three) times daily as needed for anxiety. May take 2 tablets (0.5 mg total) at bedtime. 60 tablet 0   aspirin 81 MG chewable tablet Chew 1 tablet (81 mg  total) by mouth daily.     brompheniramine-pseudoephedrine-DM 30-2-10 MG/5ML syrup Take 5 mLs by mouth 3 (three) times daily as needed. 120 mL 0   carvedilol (COREG) 12.5 MG tablet Take 1 tablet (12.5 mg total) by mouth 2 (two) times daily. 180 tablet 1   cetirizine (ZYRTEC) 5 MG tablet Take 5 mg by mouth daily.     dexamethasone (DECADRON) 4 MG tablet Take 1 tablet twice a day the day before, the day of, and the day after chemotherapy 40 tablet 2   dorzolamide-timolol (COSOPT) 22.3-6.8 MG/ML ophthalmic solution Place 1  drop into both eyes 2 (two) times daily.     dronabinol (MARINOL) 2.5 MG capsule Take 1 capsule (2.5 mg total) by mouth 2 (two) times daily as needed (decreased appetite). 60 capsule 0   enoxaparin (LOVENOX) 60 MG/0.6ML injection Inject 0.6 mLs (60 mg total) into the skin every 12 (twelve) hours. (Patient taking differently: Inject 80 mg into the skin daily. 80 mg) 60 mL 1   esomeprazole (NEXIUM) 20 MG capsule Take 1 capsule (20 mg total) by mouth in the morning and at bedtime. 60 capsule 2   FLUoxetine (PROZAC) 10 MG capsule Take 2 capsules (20 mg total) by mouth daily. 90 capsule 3   folic acid (FOLVITE) 1 MG tablet TAKE 1 TABLET BY MOUTH EVERY DAY 90 tablet 1   hyoscyamine (LEVSIN SL) 0.125 MG SL tablet Place 2 tablets (0.25 mg total) under the tongue every 6 (six) hours as needed for cramping. Take 1-2 tablets under the tongue every 6 hours as needed for cramping 30 tablet 0   ipratropium-albuterol (DUONEB) 0.5-2.5 (3) MG/3ML SOLN Take 3 mLs by nebulization every 6 (six) hours as needed (Asthma).     Iron-FA-B Cmp-C-Biot-Probiotic (FUSION PLUS PO) Take 1 capsule by mouth daily.     isosorbide mononitrate (IMDUR) 30 MG 24 hr tablet Take 30 mg by mouth daily.     latanoprost (XALATAN) 0.005 % ophthalmic solution Place 1 drop into both eyes at bedtime.      ondansetron (ZOFRAN-ODT) 4 MG disintegrating tablet Take 1 tablet (4 mg total) by mouth every 8 (eight) hours as needed for nausea or vomiting. 30 tablet 1   osimertinib mesylate (TAGRISSO) 80 MG tablet Take 1 tablet (80 mg total) by mouth daily. 30 tablet 3   REPATHA SURECLICK 031 MG/ML SOAJ Inject 140 mg into the skin every 14 (fourteen) days. 6 mL 3   No current facility-administered medications for this visit.    SURGICAL HISTORY:  Past Surgical History:  Procedure Laterality Date   ABDOMINAL HYSTERECTOMY     partial   BRONCHIAL BIOPSY  04/21/2021   Procedure: BRONCHIAL BIOPSIES;  Surgeon: [name], DO;  Location: [facility];   Service: Pulmonary;;   BRONCHIAL BRUSHINGS  04/21/2021   Procedure: BRONCHIAL BRUSHINGS;  Surgeon: [name], DO;  Location: [facility];  Service: Pulmonary;;   BRONCHIAL NEEDLE ASPIRATION BIOPSY  04/21/2021   Procedure: BRONCHIAL NEEDLE ASPIRATION BIOPSIES;  Surgeon: [name], DO;  Location: [facility];  Service: Pulmonary;;   CHEST TUBE INSERTION Right 01/01/2018   Procedure: INSERTION PLEURAL DRAINAGE CATHETER;  Surgeon: Ivin Poot, MD;  Location: [facility] OR;  Service: Thoracic;  Laterality: Right;   CHEST TUBE INSERTION  04/21/2021   Procedure: CHEST TUBE INSERTION;  Surgeon: [name], DO;  Location: [facility];  Service: Pulmonary;;   COLONOSCOPY     CORONARY STENT INTERVENTION N/A 06/16/2019   Procedure: CORONARY STENT INTERVENTION;  Surgeon: Jettie Booze, MD;  Location: [facility];  Service: Cardiovascular;  Laterality: N/A;   DILATION AND CURETTAGE OF UTERUS     EYE SURGERY     due to Glaucoma   IR IMAGING GUIDED PORT INSERTION  03/22/2021   IR PORT REPAIR CENTRAL VENOUS ACCESS DEVICE  04/15/2021   LEFT HEART CATH AND CORONARY ANGIOGRAPHY N/A 06/16/2019   Procedure: LEFT HEART CATH AND CORONARY ANGIOGRAPHY;  Surgeon: Jettie Booze, MD;  Location: [facility];  Service: Cardiovascular;  Laterality: N/A;   REMOVAL OF PLEURAL DRAINAGE CATHETER Right 11/07/2018   Procedure: REMOVAL OF PLEURAL DRAINAGE CATHETER;  Surgeon: Ivin Poot, MD;  Location: [facility];  Service: Thoracic;  Laterality: Right;   ROTATOR CUFF REPAIR     TUBAL LIGATION     VIDEO BRONCHOSCOPY WITH ENDOBRONCHIAL NAVIGATION Bilateral 04/21/2021   Procedure: VIDEO BRONCHOSCOPY WITH ENDOBRONCHIAL NAVIGATION;  Surgeon: [name], DO;  Location: [facility];  Service: Pulmonary;  Laterality: Bilateral;  ION   WISDOM TOOTH EXTRACTION      REVIEW OF SYSTEMS:  Constitutional: positive for fatigue Eyes: negative Ears, nose, mouth, throat, and face:  negative Respiratory: positive for dyspnea on exertion Cardiovascular: negative Gastrointestinal: negative Genitourinary:negative Integument/breast: negative Hematologic/lymphatic: negative Musculoskeletal:negative Neurological: negative Behavioral/Psych: negative Endocrine: negative Allergic/Immunologic: negative   PHYSICAL EXAMINATION: General appearance: alert, cooperative, appears stated age, fatigued, and no distress Head: Normocephalic, without obvious abnormality, atraumatic Neck: no adenopathy, no JVD, supple, symmetrical, trachea midline, and thyroid not enlarged, symmetric, no tenderness/mass/nodules Lymph nodes: Cervical, supraclavicular, and axillary nodes normal. Resp: clear to auscultation bilaterally Back: symmetric, no curvature. ROM normal. No CVA tenderness. Cardio: regular rate and rhythm, S1, S2 normal, no murmur, click, rub or gallop GI: soft, non-tender; bowel sounds normal; no masses,  no organomegaly Extremities: extremities normal, atraumatic, no cyanosis or edema Neurologic: Alert and oriented X 3, normal strength and tone. Normal symmetric reflexes. Normal coordination and gait  ECOG PERFORMANCE STATUS: 1 - Symptomatic but completely ambulatory  Blood pressure (!) 151/63, pulse 75, temperature (!) 97.1 F (36.2 C), temperature source Tympanic, resp. rate 17, weight 118 lb 6 oz (53.7 kg), SpO2 100 %.  LABORATORY DATA: Lab Results  Component Value Date   WBC 6.1 11/10/2021   HGB 10.1 (L) 11/10/2021   HCT 31.1 (L) 11/10/2021   MCV 90.1 11/10/2021   PLT 195 11/10/2021      Chemistry      Component Value Date/Time   NA 140 10/21/2021 0831   NA 140 03/04/2021 1516   K 3.4 (L) 10/21/2021 0831   CL 102 10/21/2021 0831   CO2 28 10/21/2021 0831   BUN 27 (H) 10/21/2021 0831   BUN 21 03/04/2021 1516   CREATININE 1.21 (H) 10/21/2021 0831      Component Value Date/Time   CALCIUM 9.7 10/21/2021 0831   ALKPHOS 75 10/21/2021 0831   AST 43 (H) 10/21/2021  0831   ALT 52 (H) 10/21/2021 0831   BILITOT 0.4 10/21/2021 0831       RADIOGRAPHIC STUDIES: CT Chest W Contrast  Result Date: 11/08/2021 CLINICAL DATA:  Metastatic right upper lobe lung cancer restaging EXAM: CT CHEST WITH CONTRAST TECHNIQUE: Multidetector CT imaging of the chest was performed during intravenous contrast administration. RADIATION DOSE REDUCTION: This exam was performed according to the departmental dose-optimization program which includes automated exposure control, adjustment of the mA and/or kV according to patient size and/or use of iterative reconstruction technique. CONTRAST:  51m OMNIPAQUE IOHEXOL  300 MG/ML  SOLN COMPARISON:  09/06/2021 FINDINGS: Cardiovascular: Right chest port catheter. Aortic atherosclerosis. Normal heart size. Three-vessel coronary artery calcifications. No pericardial effusion. Mediastinum/Nodes: Unchanged post treatment appearance of matted soft tissue throughout the mediastinum and right hilum. No discretely enlarged mediastinal, hilar, or axillary lymph nodes. Thyroid gland, trachea, and esophagus demonstrate no significant findings. Lungs/Pleura: Unchanged post treatment/post radiation appearance of the right chest, with dense consolidation and fibrosis of the perihilar and suprahilar right upper lobe. Although poorly visualized against a background of fibrotic consolidation, a mass of the apical right upper lobe is not significantly changed, measuring approximately 2.0 x 1.6 cm when measured with similar technique (series 4, image 29). Unchanged small, chronic, loculated right pleural effusion. Upper Abdomen: No acute abnormality. Musculoskeletal: No chest wall abnormality. No suspicious osseous lesions identified. IMPRESSION: 1. Unchanged post treatment/post radiation appearance of the right chest, with dense consolidation and fibrosis of the perihilar and suprahilar right upper lobe. Although poorly visualized against a background of fibrotic  consolidation, a mass of the apical right upper lobe is not significantly changed. 2. Unchanged post treatment appearance of matted soft tissue throughout the mediastinum and right hilum. No discretely enlarged lymph nodes. 3. Unchanged small, chronic, loculated right pleural effusion. 4. Coronary artery disease. Aortic Atherosclerosis (ICD10-I70.0). Electronically Signed   By: Delanna Ahmadi M.D.   On: 11/08/2021 11:00    ASSESSMENT AND PLAN: This is a very pleasant 71 years old never smoker African-American female recently with a stage IV non-small cell lung cancer, adenocarcinoma with positive EGFR mutation with deletion in exon 19. The patient was started on treatment with Tagrisso 80 mg p.o. daily status post 35 months of treatment. She has been tolerating this treatment well with no concerning adverse effects except for intermittent diarrhea. She had repeat CT scan of the chest, abdomen pelvis performed recently.  I personally and independently reviewed the scans and discussed the results with the patient and her boyfriend today. Unfortunately the CT scan showed interval progression of the right apical lung mass in addition to progression of mediastinal lymphadenopathy concerning for worsening of her disease. She has no actionable resistant mutation on the molecular studies performed by [facility]. The patient continued her current treatment with Tagrisso and tolerating it fairly well. 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 Sondra Come completed January 05, 2021. The patient had significant opacities in her lung that was initially thought to be secondary to radiation treatment versus Tagrisso induced pneumonitis versus lymphangitic spread of the tumor.  She was treated with high-dose taper regiment of prednisone Repeat imaging studies after the palliative radiotherapy showed evidence for disease  progression. Her molecular studies by [facility] recently showed no new resistant mutation.  After discussion of her treatment options including palliative care and hospice referral versus palliative systemic chemotherapy the patient was interested in proceeding palliative systemic chemotherapy.  She started  palliative systemic chemotherapy with carboplatin for AUC of 5 and Alimta 500 Mg/M2 every 3 weeks.  Status post 11 cycles.  Starting from cycle #7 the patient is on treatment with single agent Alimta every 3 weeks. I did not add a Avastin to her treatment because of the recent stroke.  She also continued her treatment with Tagrisso at the same time.  The patient has been tolerating her treatment with maintenance Alimta and Tagrisso fairly well with no concerning complaints. She had repeat CT scan of the  chest performed recently.  I personally and independently reviewed the scan images and discussed the result with the patient and her boyfriend. Her scan showed no concerning findings for disease progression. I recommended for the patient to continue her current maintenance treatment with Alimta and Tagrisso. I will see her back for follow-up visit in 3 weeks for evaluation before the next cycle of her chemotherapy. The patient was advised to call immediately if she has any concerning symptoms in the interval. The patient voices understanding of current disease status and treatment options and is in agreement with the current care plan.  All questions were answered. The patient knows to call the clinic with any problems, questions or concerns. We can certainly see the patient much sooner if necessary.  he total time spent in the appointment was 35 minutes.  Disclaimer: This note was dictated with voice recognition software. Similar sounding words can inadvertently be transcribed and may not be corrected upon review.

## 2021-11-11 DIAGNOSIS — E785 Hyperlipidemia, unspecified: Secondary | ICD-10-CM | POA: Diagnosis not present

## 2021-11-11 DIAGNOSIS — R69 Illness, unspecified: Secondary | ICD-10-CM | POA: Diagnosis not present

## 2021-11-11 DIAGNOSIS — E876 Hypokalemia: Secondary | ICD-10-CM | POA: Diagnosis not present

## 2021-11-11 DIAGNOSIS — I10 Essential (primary) hypertension: Secondary | ICD-10-CM | POA: Diagnosis not present

## 2021-11-11 DIAGNOSIS — I69328 Other speech and language deficits following cerebral infarction: Secondary | ICD-10-CM | POA: Diagnosis not present

## 2021-11-11 DIAGNOSIS — Z8701 Personal history of pneumonia (recurrent): Secondary | ICD-10-CM | POA: Diagnosis not present

## 2021-11-11 DIAGNOSIS — I82403 Acute embolism and thrombosis of unspecified deep veins of lower extremity, bilateral: Secondary | ICD-10-CM | POA: Diagnosis not present

## 2021-11-11 DIAGNOSIS — K219 Gastro-esophageal reflux disease without esophagitis: Secondary | ICD-10-CM | POA: Diagnosis not present

## 2021-11-11 DIAGNOSIS — I2699 Other pulmonary embolism without acute cor pulmonale: Secondary | ICD-10-CM | POA: Diagnosis not present

## 2021-11-11 DIAGNOSIS — E559 Vitamin D deficiency, unspecified: Secondary | ICD-10-CM | POA: Diagnosis not present

## 2021-11-11 DIAGNOSIS — I2510 Atherosclerotic heart disease of native coronary artery without angina pectoris: Secondary | ICD-10-CM | POA: Diagnosis not present

## 2021-11-11 DIAGNOSIS — I252 Old myocardial infarction: Secondary | ICD-10-CM | POA: Diagnosis not present

## 2021-11-11 DIAGNOSIS — H2512 Age-related nuclear cataract, left eye: Secondary | ICD-10-CM | POA: Diagnosis not present

## 2021-11-11 DIAGNOSIS — C3491 Malignant neoplasm of unspecified part of right bronchus or lung: Secondary | ICD-10-CM | POA: Diagnosis not present

## 2021-11-11 DIAGNOSIS — H409 Unspecified glaucoma: Secondary | ICD-10-CM | POA: Diagnosis not present

## 2021-11-11 DIAGNOSIS — I7300 Raynaud's syndrome without gangrene: Secondary | ICD-10-CM | POA: Diagnosis not present

## 2021-11-11 DIAGNOSIS — G4700 Insomnia, unspecified: Secondary | ICD-10-CM | POA: Diagnosis not present

## 2021-11-22 DIAGNOSIS — Z8701 Personal history of pneumonia (recurrent): Secondary | ICD-10-CM | POA: Diagnosis not present

## 2021-11-22 DIAGNOSIS — I7300 Raynaud's syndrome without gangrene: Secondary | ICD-10-CM | POA: Diagnosis not present

## 2021-11-22 DIAGNOSIS — G4700 Insomnia, unspecified: Secondary | ICD-10-CM | POA: Diagnosis not present

## 2021-11-22 DIAGNOSIS — H409 Unspecified glaucoma: Secondary | ICD-10-CM | POA: Diagnosis not present

## 2021-11-22 DIAGNOSIS — I10 Essential (primary) hypertension: Secondary | ICD-10-CM | POA: Diagnosis not present

## 2021-11-22 DIAGNOSIS — E785 Hyperlipidemia, unspecified: Secondary | ICD-10-CM | POA: Diagnosis not present

## 2021-11-22 DIAGNOSIS — R69 Illness, unspecified: Secondary | ICD-10-CM | POA: Diagnosis not present

## 2021-11-22 DIAGNOSIS — I2699 Other pulmonary embolism without acute cor pulmonale: Secondary | ICD-10-CM | POA: Diagnosis not present

## 2021-11-22 DIAGNOSIS — E876 Hypokalemia: Secondary | ICD-10-CM | POA: Diagnosis not present

## 2021-11-22 DIAGNOSIS — I252 Old myocardial infarction: Secondary | ICD-10-CM | POA: Diagnosis not present

## 2021-11-22 DIAGNOSIS — E559 Vitamin D deficiency, unspecified: Secondary | ICD-10-CM | POA: Diagnosis not present

## 2021-11-22 DIAGNOSIS — I82403 Acute embolism and thrombosis of unspecified deep veins of lower extremity, bilateral: Secondary | ICD-10-CM | POA: Diagnosis not present

## 2021-11-22 DIAGNOSIS — K219 Gastro-esophageal reflux disease without esophagitis: Secondary | ICD-10-CM | POA: Diagnosis not present

## 2021-11-22 DIAGNOSIS — C3491 Malignant neoplasm of unspecified part of right bronchus or lung: Secondary | ICD-10-CM | POA: Diagnosis not present

## 2021-11-22 DIAGNOSIS — I2510 Atherosclerotic heart disease of native coronary artery without angina pectoris: Secondary | ICD-10-CM | POA: Diagnosis not present

## 2021-11-22 DIAGNOSIS — I69328 Other speech and language deficits following cerebral infarction: Secondary | ICD-10-CM | POA: Diagnosis not present

## 2021-11-22 DIAGNOSIS — H2512 Age-related nuclear cataract, left eye: Secondary | ICD-10-CM | POA: Diagnosis not present

## 2021-11-29 NOTE — Progress Notes (Signed)
[place] OFFICE PROGRESS NOTE  Allison [name], Allison [name] [id number]  DIAGNOSIS: Stage IV (T2 a,N2, M1a) non-small cell lung cancer, adenocarcinoma diagnosed in July 2019 and presented with right upper lobe lung mass in addition to mediastinal lymphadenopathy as well as bilateral pulmonary nodules and malignant right pleural effusion.   Biomarker Findings Microsatellite status - Cannot Be Determined Tumor Mutational Burden - Cannot Be Determined Genomic Findings For a complete list of the genes assayed, please refer to the Appendix. EGFR exon 19 deletion ([id number]) TP53 Y220C 7 Disease relevant genes with no reportable alterations: KRAS, ALK, BRAF, MET, RET, ERBB2, ROS1   PRIOR THERAPY: 1) Status post right Pleurx catheter placement by Dr. Prescott Gum for drainage of malignant right pleural effusion. 2) palliative radiotherapy to the enlarging right upper lobe lung mass and mediastinum under the care of Dr. Sondra Come expected to be completed on January 05, 2021. 3) Tagrisso 80 mg p.o. daily.  First dose was given on 01/29/2018.  Status post 39 months of treatment.  CURRENT THERAPY: Systemic chemotherapy with carboplatin for AUC of 5 and Alimta 500 Mg/M2 every 3 weeks.  First dose 03/17/2021.  The patient will also continue her current treatment with Tagrisso 80 mg p.o. daily.  She is status post 12 cycles. Starting from cycle #7, she will be on Alimta only 500 mg/m2.   INTERVAL HISTORY: Allison [name] 71 y.o. female returns to the clinic today for a follow-up visit accompanied by her significant other.  The patient is feeling fairly well today without any new concerning complaints.  She did mention that she continues to have some anxiety.  Her PCP recently increased her Prozac.  She has a follow-up with the provider that prescribes Prozac next week on Tuesday.  The patient also has been taking CBD oil for her anxiety and she is wondering if she  is able to take this while on chemotherapy.  She thinks that this has been helping.  She was previously prescribed Xanax but it makes her drowsy which she does not like.  Therefore, she is no longer taking this.  She previously had some sessions with Dr. [name] from psychology for her anxiety.  She does not have any further sessions arranged at this time.    She was last seen in the clinic 3 weeks ago by Dr. Julien Nordmann and she had a restaging scan at that time which did not show any evidence of disease progression.  She is continuing on maintenance treatment with Alimta.  She reports her baseline fatigue.  Her fatigue is worse with overactivity.  Overall though, she reports she has more good days than before. She denies any fever or night sweats.  Regarding her appetite, she was previously seen by member the nutritionist team.  She was also previously prescribed Marinol which she has stopped taking.  She reports that she is eating more small frequent meals and snacking. Her weight is stable at this time.  She is no longer taking Ensure. She continues to sometimes have occasional chest tightness for which she takes Tylenol.  She uses her vibrating vest to help break up secretions in her lung which is helpful for her.  She will take Nexium andhyscyamine for abdominal discomfort.  She denies any nausea or vomiting.  She started taking iron pills for her anemia which caused some diarrhea.  She denies any headache or visual changes.  She continues to have similar shortness of breath with exertion  which all depends on the activity that she is doing.  She mentions she has some mild foot and ankle swelling bilaterally without any erythema or pain.  She reports this is worse with being on her feet for prolonged periods and does improve with elevation of her legs.  Of note the patient is on Lovenox. She is here today for evaluation and repeat blood work before undergoing cycle #13 of treatment.    MEDICAL HISTORY: Past  Medical History:  Diagnosis Date   Anemia    Anxiety    Arthritis    Asthma    exercise induced   Depression    PMH   Dyspnea    GERD (gastroesophageal reflux disease)    Glaucoma    History of radiation therapy 01/05/2021   IMRT right lung  11/24/2020-01/05/2021  Dr [name]   Hypertension    lung ca dx'd 11/2017   right   Malignant pleural effusion    right   Nuclear sclerotic cataract of right eye 02/28/2021   Dr. Warden Fillers, cataract surgery February 2023   PONV (postoperative nausea and vomiting)    Pre-diabetes    Raynaud's disease    Raynaud's disease    Stroke ([place]) 01/2021   balance off, some express aphasia, weakness    ALLERGIES:  is allergic to penicillins, vicodin [hydrocodone-acetaminophen], and other.  MEDICATIONS:  Current Outpatient Medications  Medication Sig Dispense Refill   potassium chloride SA (KLOR-CON M) 20 MEQ tablet Take 1 tablet (20 mEq total) by mouth daily. 5 tablet 0   acetaminophen (TYLENOL) 500 MG tablet Take 1,000 mg by mouth every 6 (six) hours as needed (pain).     ALPRAZolam (XANAX) 0.25 MG tablet Take 1 tablet (0.25 mg total) by mouth 3 (three) times daily as needed for anxiety. May take 2 tablets (0.5 mg total) at bedtime. 60 tablet 0   aspirin 81 MG chewable tablet Chew 1 tablet (81 mg total) by mouth daily.     brompheniramine-pseudoephedrine-DM 30-2-10 MG/5ML syrup Take 5 mLs by mouth 3 (three) times daily as needed. 120 mL 0   carvedilol (COREG) 12.5 MG tablet Take 1 tablet (12.5 mg total) by mouth 2 (two) times daily. 180 tablet 1   cetirizine (ZYRTEC) 5 MG tablet Take 5 mg by mouth daily.     dexamethasone (DECADRON) 4 MG tablet Take 1 tablet twice a day the day before, the day of, and the day after chemotherapy 40 tablet 2   dorzolamide-timolol (COSOPT) 22.3-6.8 MG/ML ophthalmic solution Place 1 drop into both eyes 2 (two) times daily.     dronabinol (MARINOL) 2.5 MG capsule Take 1 capsule (2.5 mg total) by mouth 2 (two)  times daily as needed (decreased appetite). 60 capsule 0   enoxaparin (LOVENOX) 60 MG/0.6ML injection Inject 0.6 mLs (60 mg total) into the skin every 12 (twelve) hours. (Patient taking differently: Inject 80 mg into the skin daily. 80 mg) 60 mL 1   esomeprazole (NEXIUM) 20 MG capsule Take 1 capsule (20 mg total) by mouth in the morning and at bedtime. 60 capsule 2   FLUoxetine (PROZAC) 10 MG capsule Take 2 capsules (20 mg total) by mouth daily. 90 capsule 3   folic acid (FOLVITE) 1 MG tablet TAKE 1 TABLET BY MOUTH EVERY DAY 90 tablet 1   hyoscyamine (LEVSIN SL) 0.125 MG SL tablet Place 2 tablets (0.25 mg total) under the tongue every 6 (six) hours as needed for cramping. Take 1-2 tablets under the tongue every  6 hours as needed for cramping 30 tablet 0   ipratropium-albuterol (DUONEB) 0.5-2.5 (3) MG/3ML SOLN Take 3 mLs by nebulization every 6 (six) hours as needed (Asthma).     Iron-FA-B Cmp-C-Biot-Probiotic (FUSION PLUS PO) Take 1 capsule by mouth daily.     isosorbide mononitrate (IMDUR) 30 MG 24 hr tablet Take 30 mg by mouth daily.     latanoprost (XALATAN) 0.005 % ophthalmic solution Place 1 drop into both eyes at bedtime.      ondansetron (ZOFRAN-ODT) 4 MG disintegrating tablet Take 1 tablet (4 mg total) by mouth every 8 (eight) hours as needed for nausea or vomiting. 30 tablet 1   osimertinib mesylate (TAGRISSO) 80 MG tablet Take 1 tablet (80 mg total) by mouth daily. 30 tablet 3   REPATHA SURECLICK 948 MG/ML SOAJ Inject 140 mg into the skin every 14 (fourteen) days. 6 mL 3   No current facility-administered medications for this visit.    SURGICAL HISTORY:  Past Surgical History:  Procedure Laterality Date   ABDOMINAL HYSTERECTOMY     partial   BRONCHIAL BIOPSY  04/21/2021   Procedure: BRONCHIAL BIOPSIES;  Surgeon: [name], DO;  Location: [facility];  Service: Pulmonary;;   BRONCHIAL BRUSHINGS  04/21/2021   Procedure: BRONCHIAL BRUSHINGS;  Surgeon: [name], DO;   Location: [facility];  Service: Pulmonary;;   BRONCHIAL NEEDLE ASPIRATION BIOPSY  04/21/2021   Procedure: BRONCHIAL NEEDLE ASPIRATION BIOPSIES;  Surgeon: [name], DO;  Location: [facility];  Service: Pulmonary;;   CHEST TUBE INSERTION Right 01/01/2018   Procedure: INSERTION PLEURAL DRAINAGE CATHETER;  Surgeon: Ivin Poot, MD;  Location: [facility] OR;  Service: Thoracic;  Laterality: Right;   CHEST TUBE INSERTION  04/21/2021   Procedure: CHEST TUBE INSERTION;  Surgeon: [name], DO;  Location: [facility];  Service: Pulmonary;;   COLONOSCOPY     CORONARY STENT INTERVENTION N/A 06/16/2019   Procedure: CORONARY STENT INTERVENTION;  Surgeon: Jettie Booze, MD;  Location: [facility];  Service: Cardiovascular;  Laterality: N/A;   DILATION AND CURETTAGE OF UTERUS     EYE SURGERY     due to Glaucoma   IR IMAGING GUIDED PORT INSERTION  03/22/2021   IR PORT REPAIR CENTRAL VENOUS ACCESS DEVICE  04/15/2021   LEFT HEART CATH AND CORONARY ANGIOGRAPHY N/A 06/16/2019   Procedure: LEFT HEART CATH AND CORONARY ANGIOGRAPHY;  Surgeon: Jettie Booze, MD;  Location: [facility];  Service: Cardiovascular;  Laterality: N/A;   REMOVAL OF PLEURAL DRAINAGE CATHETER Right 11/07/2018   Procedure: REMOVAL OF PLEURAL DRAINAGE CATHETER;  Surgeon: Ivin Poot, MD;  Location: [facility];  Service: Thoracic;  Laterality: Right;   ROTATOR CUFF REPAIR     TUBAL LIGATION     VIDEO BRONCHOSCOPY WITH ENDOBRONCHIAL NAVIGATION Bilateral 04/21/2021   Procedure: VIDEO BRONCHOSCOPY WITH ENDOBRONCHIAL NAVIGATION;  Surgeon: [name], DO;  Location: [facility];  Service: Pulmonary;  Laterality: Bilateral;  ION   WISDOM TOOTH EXTRACTION      REVIEW OF SYSTEMS:   Review of Systems  Constitutional: Positive for fatigue with overactivity.  Negative for appetite change, chills,fever and unexpected weight change.  HENT:   Negative for mouth sores, nosebleeds, sore throat and trouble  swallowing.   Eyes: Negative for eye problems and icterus.  Respiratory: Positive for stable dyspnea with overactivity.  Negative for cough, hemoptysis, and wheezing.   Cardiovascular: Negative for chest pain.  Positive for mild bilateral ankle swelling left greater  than right.  No erythema or pain. Gastrointestinal: Positive for occasional diarrhea with iron supplement.  Negative for abdominal pain, constipation, nausea and vomiting.  Genitourinary: Negative for bladder incontinence, difficulty urinating, dysuria, frequency and hematuria.   Musculoskeletal: Negative for back pain, gait problem, neck pain and neck stiffness.  Skin: Negative for itching and rash.  Neurological: Negative for dizziness, extremity weakness, gait problem, headaches, light-headedness and seizures.  Hematological: Negative for adenopathy. Does not bruise/bleed easily.  Psychiatric/Behavioral: Positive for anxiety.  Negative for confusion, depression and sleep disturbance. The patient is not nervous/anxious today.     PHYSICAL EXAMINATION:  Blood pressure 129/65, pulse 71, temperature 97.9 F (36.6 C), resp. rate 18, weight 118 lb 8 oz (53.8 kg), SpO2 100 %.  ECOG PERFORMANCE STATUS: 1  Physical Exam  Constitutional: Oriented to person, place, and time and thin appearing female and in no distress.  HENT:  Head: Normocephalic and atraumatic.  Mouth/Throat: Oropharynx is clear and moist. No oropharyngeal exudate.  Eyes: Conjunctivae are normal. Right eye exhibits no discharge. Left eye exhibits no discharge. No scleral icterus.  Neck: Normal range of motion. Neck supple.  Cardiovascular: Normal rate, regular rhythm, normal heart sounds and intact distal pulses.   Pulmonary/Chest: Effort normal and breath sounds normal. No respiratory distress. No wheezes. No rales.  Abdominal: Soft. Bowel sounds are normal. Exhibits no distension and no mass. There is no tenderness.  Musculoskeletal: Normal range of motion.  Exhibits no edema.  Lymphadenopathy:    No cervical adenopathy.  Neurological: Alert and oriented to person, place, and time.  Muscle wasting.  Gait normal. Coordination normal.  Skin: Mild linear area of erythema on right abdomen. Skin is warm and dry. No rash noted. Not diaphoretic. No erythema. No pallor.  Psychiatric: Mood, memory and judgment normal.  Vitals reviewed.  LABORATORY DATA: Lab Results  Component Value Date   WBC 5.1 12/01/2021   HGB 9.8 (L) 12/01/2021   HCT 30.4 (L) 12/01/2021   MCV 90.2 12/01/2021   PLT 213 12/01/2021      Chemistry      Component Value Date/Time   NA 139 12/01/2021 0947   NA 140 03/04/2021 1516   K 3.2 (L) 12/01/2021 0947   CL 105 12/01/2021 0947   CO2 27 12/01/2021 0947   BUN 17 12/01/2021 0947   BUN 21 03/04/2021 1516   CREATININE 1.18 (H) 12/01/2021 0947      Component Value Date/Time   CALCIUM 9.7 12/01/2021 0947   ALKPHOS 67 12/01/2021 0947   AST 38 12/01/2021 0947   ALT 44 12/01/2021 0947   BILITOT 0.2 (L) 12/01/2021 0947       RADIOGRAPHIC STUDIES:  CT Chest W Contrast  Result Date: 11/08/2021 CLINICAL DATA:  Metastatic right upper lobe lung cancer restaging EXAM: CT CHEST WITH CONTRAST TECHNIQUE: Multidetector CT imaging of the chest was performed during intravenous contrast administration. RADIATION DOSE REDUCTION: This exam was performed according to the departmental dose-optimization program which includes automated exposure control, adjustment of the mA and/or kV according to patient size and/or use of iterative reconstruction technique. CONTRAST:  43m OMNIPAQUE IOHEXOL 300 MG/ML  SOLN COMPARISON:  09/06/2021 FINDINGS: Cardiovascular: Right chest port catheter. Aortic atherosclerosis. Normal heart size. Three-vessel coronary artery calcifications. No pericardial effusion. Mediastinum/Nodes: Unchanged post treatment appearance of matted soft tissue throughout the mediastinum and right hilum. No discretely enlarged  mediastinal, hilar, or axillary lymph nodes. Thyroid gland, trachea, and esophagus demonstrate no significant findings. Lungs/Pleura: Unchanged post treatment/post radiation appearance of  the right chest, with dense consolidation and fibrosis of the perihilar and suprahilar right upper lobe. Although poorly visualized against a background of fibrotic consolidation, a mass of the apical right upper lobe is not significantly changed, measuring approximately 2.0 x 1.6 cm when measured with similar technique (series 4, image 29). Unchanged small, chronic, loculated right pleural effusion. Upper Abdomen: No acute abnormality. Musculoskeletal: No chest wall abnormality. No suspicious osseous lesions identified. IMPRESSION: 1. Unchanged post treatment/post radiation appearance of the right chest, with dense consolidation and fibrosis of the perihilar and suprahilar right upper lobe. Although poorly visualized against a background of fibrotic consolidation, a mass of the apical right upper lobe is not significantly changed. 2. Unchanged post treatment appearance of matted soft tissue throughout the mediastinum and right hilum. No discretely enlarged lymph nodes. 3. Unchanged small, chronic, loculated right pleural effusion. 4. Coronary artery disease. Aortic Atherosclerosis (ICD10-I70.0). Electronically Signed   By: Delanna Ahmadi M.D.   On: 11/08/2021 11:00     ASSESSMENT/PLAN:  This is a very pleasant 71 year old never smoker African-American female diagnosed with stage IV non-small cell lung cancer, adenocarcinoma.  She was positive for an EGFR mutation with deletion in exon 19.  She was diagnosed in July 2019 and presented with right upper lobe lung mass in addition to mediastinal lymphadenopathy as well as bilateral pulmonary nodules and malignant right pleural effusion.   The patient was started on treatment with Tagrisso 80 mg p.o. daily status post 39 months of treatment. Started on 01/29/2018.   In April  2022, she showed evidence of disease progression with interval progression of the right apical lung mass in addition to progression of mediastinal lymphadenopathy concerning for worsening of her disease. She had repeat Guardant 360 molecular studies which did not show evidence of new resistant mutations.  Therefore, she was referred to radiation oncology and completed palliative radiotherapy to the enlarging right upper lobe lung mass and mediastinum under the care of Dr. Sondra Come.  This was completed in July 2022.  Unfortunately, the patient recently was found to have evidence of disease progression.  Therefore, Dr. Julien Nordmann started the patient on systemic chemotherapy with carboplatin for AUC of 5 and Alimta 500 mg per metered squared.  She is status post 8 cycles and she tolerated it fairly well despite her ongoing issues with fatigue, chest tightness, cough, and weight loss.  She is not a good candidate for Avastin due to her recent CVA in August 2022.  She also is continuing to take Tagrisso as it is protective against progressive metastatic disease to the brain. Starting from cycle #7,  she started maintenance single agent alimta.   The patient underwent a repeat bronchoscopy and biopsy for repeat molecular testing.  The sample from the left and right lung biopsy was negative for malignancy. This was performed in November 2022.   The patient saw Dr. Durenda Hurt from [facility] for second opinion in November 2022.  They discussed if she progresses on chemotherapy that there may be upcoming options. He discussed other treatment options which may be available in 2023 including patritumab deruxtcan and lazertinib/amivantmab. If these are not available, he wrote that they can determine if they can get the agents through an expanded access, single patient IND, or clinical trial if she has progression with chemotherapy.    Labs were reviewed.  Recommend that she proceed with cycle #13 today  scheduled.  We will see her back for follow-up visit in 3 weeks for evaluation and repeat  blood work before starting cycle #14.  She will continue using nebulizers and Mucinex for her cough and shortness of breath.  Her potassium is slightly low today.  I have sent a prescription for potassium to her pharmacy to take 20 mill equivalents once daily for approximately 5 days.  Regarding her anxiety, she is going to see Dr. Mariea Clonts on Tuesday next week.  I encouraged her to discuss dose adjustments or additional medications for her anxiety.  She learned mindfulness techniques from Dr. [name] but she feels that she needs additional medication management.  She does not like taking Xanax because she does not like the way it makes her feel.  Discussed with her about we would not encourage her to use CBD oil as we are unsure how it would interact with her chemotherapy and oral treatment with Tagrisso.  For her mild lower extremity swelling, we will keep an eye on this for now she was encouraged to elevate her legs and use compression stockings.  If she started developing worsening swelling, new erythema or calf pain discussed with the patient that we would need to consider a Doppler ultrasound to rule out blood clot, although this is less likely given that she is compliant with her Lovenox.  He does have improvement in her swelling with elevating her legs.  She has some GI upset from her iron supplement.  We discussed alternatives such as slow release iron.  The patient would like to go to [facility] for some infusions because she states it is quieter which is helpful for her anxiety. I will arrange for her to have every 3rd infusion at [facility]  The patient was advised to call immediately if she has any concerning symptoms in the interval. The patient voices understanding of current disease status and treatment options and is in agreement with the current care plan. All questions were answered. The  patient knows to call the clinic with any problems, questions or concerns. We can certainly see the patient much sooner if necessary        No orders of the defined types were placed in this encounter.    The total time spent in the appointment was 20-29 minutes   [place] Pfund L Johanny Segers, PA-C 12/01/21

## 2021-12-01 ENCOUNTER — Inpatient Hospital Stay: Payer: Medicare HMO

## 2021-12-01 ENCOUNTER — Other Ambulatory Visit: Payer: Medicare HMO

## 2021-12-01 ENCOUNTER — Encounter: Payer: Self-pay | Admitting: Physician Assistant

## 2021-12-01 ENCOUNTER — Inpatient Hospital Stay: Payer: Medicare HMO | Attending: Internal Medicine | Admitting: Physician Assistant

## 2021-12-01 ENCOUNTER — Other Ambulatory Visit: Payer: Self-pay

## 2021-12-01 VITALS — BP 129/65 | HR 71 | Temp 97.9°F | Resp 18 | Wt 118.5 lb

## 2021-12-01 DIAGNOSIS — Z5111 Encounter for antineoplastic chemotherapy: Secondary | ICD-10-CM | POA: Diagnosis not present

## 2021-12-01 DIAGNOSIS — F419 Anxiety disorder, unspecified: Secondary | ICD-10-CM | POA: Insufficient documentation

## 2021-12-01 DIAGNOSIS — Z79899 Other long term (current) drug therapy: Secondary | ICD-10-CM | POA: Diagnosis not present

## 2021-12-01 DIAGNOSIS — C771 Secondary and unspecified malignant neoplasm of intrathoracic lymph nodes: Secondary | ICD-10-CM | POA: Insufficient documentation

## 2021-12-01 DIAGNOSIS — Z8673 Personal history of transient ischemic attack (TIA), and cerebral infarction without residual deficits: Secondary | ICD-10-CM | POA: Insufficient documentation

## 2021-12-01 DIAGNOSIS — C3411 Malignant neoplasm of upper lobe, right bronchus or lung: Secondary | ICD-10-CM | POA: Insufficient documentation

## 2021-12-01 DIAGNOSIS — E876 Hypokalemia: Secondary | ICD-10-CM | POA: Diagnosis not present

## 2021-12-01 DIAGNOSIS — Z7982 Long term (current) use of aspirin: Secondary | ICD-10-CM | POA: Insufficient documentation

## 2021-12-01 DIAGNOSIS — R600 Localized edema: Secondary | ICD-10-CM | POA: Insufficient documentation

## 2021-12-01 DIAGNOSIS — F32A Depression, unspecified: Secondary | ICD-10-CM | POA: Insufficient documentation

## 2021-12-01 DIAGNOSIS — C3491 Malignant neoplasm of unspecified part of right bronchus or lung: Secondary | ICD-10-CM

## 2021-12-01 DIAGNOSIS — Z923 Personal history of irradiation: Secondary | ICD-10-CM | POA: Insufficient documentation

## 2021-12-01 DIAGNOSIS — Z7901 Long term (current) use of anticoagulants: Secondary | ICD-10-CM | POA: Insufficient documentation

## 2021-12-01 DIAGNOSIS — D649 Anemia, unspecified: Secondary | ICD-10-CM | POA: Insufficient documentation

## 2021-12-01 DIAGNOSIS — R197 Diarrhea, unspecified: Secondary | ICD-10-CM | POA: Diagnosis not present

## 2021-12-01 DIAGNOSIS — Z95828 Presence of other vascular implants and grafts: Secondary | ICD-10-CM

## 2021-12-01 DIAGNOSIS — R69 Illness, unspecified: Secondary | ICD-10-CM | POA: Diagnosis not present

## 2021-12-01 LAB — CBC WITH DIFFERENTIAL (CANCER CENTER ONLY)
Abs Immature Granulocytes: 0.02 10*3/uL (ref 0.00–0.07)
Basophils Absolute: 0 10*3/uL (ref 0.0–0.1)
Basophils Relative: 0 %
Eosinophils Absolute: 0 10*3/uL (ref 0.0–0.5)
Eosinophils Relative: 1 %
HCT: 30.4 % — ABNORMAL LOW (ref 36.0–46.0)
Hemoglobin: 9.8 g/dL — ABNORMAL LOW (ref 12.0–15.0)
Immature Granulocytes: 0 %
Lymphocytes Relative: 14 %
Lymphs Abs: 0.7 10*3/uL (ref 0.7–4.0)
MCH: 29.1 pg (ref 26.0–34.0)
MCHC: 32.2 g/dL (ref 30.0–36.0)
MCV: 90.2 fL (ref 80.0–100.0)
Monocytes Absolute: 0.4 10*3/uL (ref 0.1–1.0)
Monocytes Relative: 8 %
Neutro Abs: 3.9 10*3/uL (ref 1.7–7.7)
Neutrophils Relative %: 77 %
Platelet Count: 213 10*3/uL (ref 150–400)
RBC: 3.37 MIL/uL — ABNORMAL LOW (ref 3.87–5.11)
RDW: 13.2 % (ref 11.5–15.5)
WBC Count: 5.1 10*3/uL (ref 4.0–10.5)
nRBC: 0 % (ref 0.0–0.2)

## 2021-12-01 LAB — CMP (CANCER CENTER ONLY)
ALT: 44 U/L (ref 0–44)
AST: 38 U/L (ref 15–41)
Albumin: 3.9 g/dL (ref 3.5–5.0)
Alkaline Phosphatase: 67 U/L (ref 38–126)
Anion gap: 7 (ref 5–15)
BUN: 17 mg/dL (ref 8–23)
CO2: 27 mmol/L (ref 22–32)
Calcium: 9.7 mg/dL (ref 8.9–10.3)
Chloride: 105 mmol/L (ref 98–111)
Creatinine: 1.18 mg/dL — ABNORMAL HIGH (ref 0.44–1.00)
GFR, Estimated: 50 mL/min — ABNORMAL LOW (ref >60)
Glucose, Bld: 126 mg/dL — ABNORMAL HIGH (ref 70–99)
Potassium: 3.2 mmol/L — ABNORMAL LOW (ref 3.5–5.1)
Sodium: 139 mmol/L (ref 135–145)
Total Bilirubin: 0.2 mg/dL — ABNORMAL LOW (ref 0.3–1.2)
Total Protein: 7.1 g/dL (ref 6.5–8.1)

## 2021-12-01 LAB — SAMPLE TO BLOOD BANK

## 2021-12-01 MED ORDER — SODIUM CHLORIDE 0.9% FLUSH
10.0000 mL | Freq: Once | INTRAVENOUS | Status: AC
  Administered 2021-12-01: 10 mL

## 2021-12-01 MED ORDER — SODIUM CHLORIDE 0.9 % IV SOLN: Freq: Once | INTRAVENOUS | Status: AC

## 2021-12-01 MED ORDER — POTASSIUM CHLORIDE CRYS ER 20 MEQ PO TBCR: 20.0000 meq | EXTENDED_RELEASE_TABLET | Freq: Every day | ORAL | 0 refills | Status: DC

## 2021-12-01 MED ORDER — ONDANSETRON HCL 4 MG/2ML IJ SOLN
8.0000 mg | Freq: Once | INTRAMUSCULAR | Status: AC
  Administered 2021-12-01: 8 mg via INTRAVENOUS
  Filled 2021-12-01: qty 4

## 2021-12-01 MED ORDER — SODIUM CHLORIDE 0.9 % IV SOLN
400.0000 mg/m2 | Freq: Once | INTRAVENOUS | Status: AC
  Administered 2021-12-01: 600 mg via INTRAVENOUS
  Filled 2021-12-01: qty 20

## 2021-12-01 NOTE — Patient Instructions (Signed)
[facility]COL[facility]   Discharge Instructions: Thank you for choosing [facility] to provide your oncology and hematology care.   If you have a lab appointment with the [facility], please go directly to the [facility] and check in at the registration area.   Wear comfortable clothing and clothing appropriate for easy access to any Portacath or PICC line.   We strive to give you quality time with your provider. You may need to reschedule your appointment if you arrive late (15 or more minutes).  Arriving late affects you and other patients whose appointments are after yours.  Also, if you miss three or more appointments without notifying the office, you may be dismissed from the clinic at the provider's discretion.      For prescription refill requests, have your pharmacy contact our office and allow 72 hours for refills to be completed.    Today you received the following chemotherapy and/or immunotherapy agents: Pemetrexed (Alimta)       To help prevent nausea and vomiting after your treatment, we encourage you to take your nausea medication as directed.  BELOW ARE SYMPTOMS THAT SHOULD BE REPORTED IMMEDIATELY: *FEVER GREATER THAN 100.4 F (38 C) OR HIGHER *CHILLS OR SWEATING *NAUSEA AND VOMITING THAT IS NOT CONTROLLED WITH YOUR NAUSEA MEDICATION *UNUSUAL SHORTNESS OF BREATH *UNUSUAL BRUISING OR BLEEDING *URINARY PROBLEMS (pain or burning when urinating, or frequent urination) *BOWEL PROBLEMS (unusual diarrhea, constipation, pain near the anus) TENDERNESS IN MOUTH AND THROAT WITH OR WITHOUT PRESENCE OF ULCERS (sore throat, sores in mouth, or a toothache) UNUSUAL RASH, SWELLING OR PAIN  UNUSUAL VAGINAL DISCHARGE OR ITCHING   Items with * indicate a potential emergency and should be followed up as soon as possible or go to the Emergency Department if any problems should occur.  Please show the CHEMOTHERAPY ALERT CARD or IMMUNOTHERAPY ALERT CARD at  check-in to the Emergency Department and triage nurse.  Should you have questions after your visit or need to cancel or reschedule your appointment, please contact [facility]  Dept: [phone]  and follow the prompts.  Office hours are 8:00 a.m. to 4:30 p.m. Monday - Friday. Please note that voicemails left after 4:00 p.m. may not be returned until the following business day.  We are closed weekends and major holidays. You have access to a nurse at all times for urgent questions. Please call the main number to the clinic Dept: [phone] and follow the prompts.   For any non-urgent questions, you may also contact your provider using MyChart. We now offer e-Visits for anyone 71 and older to request care online for non-urgent symptoms. For details visit [email or web address].   Also download the MyChart app! Go to the app store, search "MyChart", open the app, select [facility], and log in with your MyChart username and password.  Due to Covid, a mask is required upon entering the hospital/clinic. If you do not have a mask, one will be given to you upon arrival. For doctor visits, patients may have 1 support person aged 71 or older with them. For treatment visits, patients cannot have anyone with them due to current Covid guidelines and our immunocompromised population.

## 2021-12-05 ENCOUNTER — Telehealth: Payer: Self-pay | Admitting: Adult Health

## 2021-12-05 NOTE — Telephone Encounter (Signed)
Robinette Haines( Clinical Manager)/ [facility] has called for VO for one additional speech visit for discharge, VM secure

## 2021-12-05 NOTE — Telephone Encounter (Signed)
Called Aaron Edelman and LVM to advise he may proceed with one additional ST visit.

## 2021-12-06 ENCOUNTER — Telehealth: Payer: Self-pay | Admitting: Physician Assistant

## 2021-12-06 ENCOUNTER — Encounter: Payer: Self-pay | Admitting: Internal Medicine

## 2021-12-06 ENCOUNTER — Other Ambulatory Visit: Payer: Medicare HMO | Admitting: Internal Medicine

## 2021-12-06 VITALS — BP 118/72 | HR 68 | Resp 18

## 2021-12-06 DIAGNOSIS — C3491 Malignant neoplasm of unspecified part of right bronchus or lung: Secondary | ICD-10-CM

## 2021-12-06 DIAGNOSIS — Z515 Encounter for palliative care: Secondary | ICD-10-CM

## 2021-12-06 DIAGNOSIS — R229 Localized swelling, mass and lump, unspecified: Secondary | ICD-10-CM | POA: Diagnosis not present

## 2021-12-06 DIAGNOSIS — E43 Unspecified severe protein-calorie malnutrition: Secondary | ICD-10-CM | POA: Diagnosis not present

## 2021-12-06 DIAGNOSIS — L989 Disorder of the skin and subcutaneous tissue, unspecified: Secondary | ICD-10-CM

## 2021-12-06 DIAGNOSIS — K117 Disturbances of salivary secretion: Secondary | ICD-10-CM | POA: Diagnosis not present

## 2021-12-06 DIAGNOSIS — R69 Illness, unspecified: Secondary | ICD-10-CM | POA: Diagnosis not present

## 2021-12-06 DIAGNOSIS — F411 Generalized anxiety disorder: Secondary | ICD-10-CM

## 2021-12-06 MED ORDER — FLUOXETINE HCL 20 MG PO CAPS: 20.0000 mg | ORAL_CAPSULE | Freq: Every day | ORAL | 3 refills | Status: DC

## 2021-12-06 NOTE — Telephone Encounter (Signed)
I have received a message from the patient's palliative care provider about a new subcutaneous lesion on her lateral right thigh.  The patient as well as her provider concern for possible metastatic lesion.  I attempted to call the patient but was unable to reach her.  We can arrange for a soft tissue ultrasound of the lower extremity to evaluate if this lesion looks concerning for malignancy versus other soft tissue process such as cyst or lipoma.  I left a voicemail instructing the patient to call us back or send [place] a MyChart message so we can discuss this further.

## 2021-12-06 NOTE — Progress Notes (Signed)
[facility] Palliative Care Follow-Up Visit Telephone: [phone]  Fax: [phone]   Date of encounter: 12/06/21 12:32 PM PATIENT NAME: Allison [name] [place] [id number]   [phone] (home)  DOB: [birth year] MRN: [id number] PRIMARY CARE PROVIDER:    [name], MD,  [address] [place] [address] [phone]  REFERRING PROVIDER:   [name], [place],  [place] [address] [phone]  RESPONSIBLE PARTY:    Contact Information     Name Relation Home Work [place]" Den[place] George [phone]  [phone]   Kanaan,[name] Daughter [phone]  [phone]   [name] Daughter [phone]  [phone]   Kegg,[name] Daughter [phone]  [phone]        I met face to face with patient and family in her home. Palliative Care was asked to follow this patient by consultation request of  [name], MD to address advance care planning and complex medical decision making. This is follow-up visit.                                     ASSESSMENT AND PLAN / RECOMMENDATIONS:   Advance Care Planning/Goals of Care: Goals include to maximize quality of life and symptom management. Patient/health care surrogate gave his/her permission to discuss.Our advance care planning conversation included a discussion about:    The value and importance of advance care planning  Experiences with loved ones who have been seriously ill or have died  Exploration of personal, cultural or spiritual beliefs that might influence medical decisions  Exploration of goals of care in the event of a sudden injury or illness  Identification  of a healthcare agent  Review and updating or creation of an  advance directive document . Decision not to resuscitate or to de-escalate disease focused treatments due to poor prognosis. CODE STATUS:  DNR,  MOST:  DNR, limited additional interventions, abx if indicated, IVF trial,  feeding tube trial only  Symptom Management/Plan:  Congestion:   1. Nodule, subcutaneous -Newly noted on Saturday (3 days ago) on her right lateral proximal thigh, not in groin and no palpable lymphadenopathy, she's concerned as am I so I did send a message to her oncology PA for her opinion on next steps--pt is observing until she hears from [place] -unfortunately, this portends a poor prognosis if it is a malignant nodule (may also explain her issues with thermoregulation she's noticing)  2. Generalized anxiety disorder -cont prozac 34m daily, mindfulness at silver sneakers, support groups, she is wanting an anxiety support group so she's going to ask those in her lung ca support groups and I recommended looking at the different behavioral clinics which may have these (though not as specific to her illness, of course), also continue to keep her mind occupied--she plans to start puzzles and is playing a word game on her phone that she loves, has another beach trip planned around her bday -she declines any additional prescription options like lorazepam for anxiety and is not using alprazolam with her CBD--CBD is helping sleep at night, but not markedly changing her daytime anxiety and worry that she feels like comes out of nowhere  3. Adenocarcinoma of right lung, stage 4 (HCC) -no signs of progression on her CT chest; however, new nodule on thigh is worrisome so await oncology response for next steps  4. Increased oropharyngeal secretions -  restart using mucinex or robitussin for the mucus--currently has some in her chest.  Also improve hydration as she admits she's not done so well with it.  5. Protein-calorie malnutrition, severe -continues to try to increase protein, was doing better with intake and gained a few lbs; however, has had more phlegm and bad taste in her mouth since Saturday so we're trying to address that in  hopes her appetite might get back to where it was before this last chemo session last week  6. Palliative care encounter -goals remain improved quality of life, continuing treatments as long as they are benefiting her and side effects don't outweigh benefits, going to the beach for her bday, also is enjoying spending time in the garden/outside when she can and trying to stay active within reason -reminded her about conserving energy as she overdid it again Saturday when still getting decadron   Follow up Palliative Care Visit: Palliative care will continue to follow for complex medical decision making, advance care planning, symptom mgt and clarification of goals. Return 02/07/2022 and prn.  This visit was coded based on medical decision making (MDM).  Highly complex illness with high risk of decompensation including newly identified evidence of disease progression, I completed review of oncology notes, labs, CT chest, infusions, addressed symptoms of congestion and anxiety and provided supportive listening.  PPS: 50%  HOSPICE ELIGIBILITY/DIAGNOSIS: Yes/metastatic RUL adenocarcinoma   Chief Complaint: Follow-up palliative visit  HISTORY OF PRESENT ILLNESS:  Allison [name] is a 71 y.o. year old female  with Stage IV (T2 a,N2, M1a) non-small cell lung cancer with EGFR mutation, adenocarcinoma diagnosed in July 2019 and presented with right upper lobe lung mass in addition to mediastinal lymphadenopathy as well as bilateral pulmonary nodules and malignant right pleural effusion, CAD with NSTEMI s/p stent, DVT and PE,GAD, GERD, D deficiency, prior postobstructive pneumonia, prior stroke in context of malignancy, severe protein calorie malnutrition, vision struggles with her one eye s/p cataract surgery among others seen in palliative care f/u.    Since I last saw her, she took a trip to the beach with [place].  They had a nice time.  She got to get a little sun and to watch the water.  She didn't  actually go in the water at all.  She had restaging of her cancer with CT chest on 11/07/21--per Dr. Worthy Flank notes, he had reviewed the images himself and noted no concerning findings for disease progression so he recommended continuing her maintenance palliative chemo with alimta and tagrisso when he saw her 11/10/21.   She also saw [facility] palliative on 5/25 and was noted to have some improvement in anxiety with the increased prozac to 48m I'd recommended.  Some nonpharmacological strategies were recommended by NP and chaplaincy, as well, to cope with anxiety.  Her weight was actually up 3.3 lbs.    When she was seen by NP on 6/15, she was still struggling quite a bit with her anxiety and a suggestion that I adjust her anxiolytics or change them was made as her nonpharm approaches are being used, but she is still having difficulty.  She has one additional ST visit with [facility] had been ordered by neuro NP.  She is going to discontinue as this is due to her anxiety.  She missed her prozac two days at one point.    Had gotten out to the garden with [place] look at what all is growing.  She goes out to  her own blackberries.    Appetite is a little worse.  She has a bit of phlegm in her chest--she is blaming being around a lot of scents/perfume etc at the [employer] downtown.  She has the nasty taste back.  She has been doing her neb daily, her vest.    She did not drink much water that day either and overdid it that day.  She was still on the steroids after chemo then.  She developed a lump on her right lateral thigh that is firm, tender only when she pushes on it or lays on it.  It's quite superficial.  Left ankle has improved with elevation vs what it was.  She has a trial pack of CBD from Dr. [name]--oncology did not want to recommend due to potential chemo interactions.  It's sublingual drops.  She has been sleeping better on it.  It's been a set of  bottles.  She has not been taking the xanax to avoid mixing them.  She's already tired and doesn't want to be loopy.  She's going to get back with Dr. [name] after she finishes it.  She has periods of tearfulness that she feels like are for no reason.  She does not want to try lorazepam.  Not using marinol b/c appetite had gotten better. She feels like what she's dealing with is a 9/10 though it's not pain.  She's extremely fatigued the first couple days after chemo.  She takes 15 min meditation and mindfulness classes.  Tries to avoid watching too much tv.  Likes a word game she plays.  It's distracting to her.  Thinking about doing a puzzle.  She really wants to avoid medications.  She wishes there was an anxiety support group.    History obtained from review of EMR, discussion with primary team, and interview with family, facility staff/caregiver and/or [name].   I reviewed available labs, medications, imaging, studies and related documents from the EMR.  Records reviewed and summarized above.   ROS Review of Systems  Constitutional:  Positive for activity change, appetite change, chills and fatigue. Negative for fever and unexpected weight change.  HENT:  Positive for congestion. Negative for trouble swallowing.   Eyes:  Negative for pain and redness.       Has not set up eye appt to follow-up   Respiratory:  Positive for shortness of breath. Negative for cough and chest tightness.   Cardiovascular:  Positive for leg swelling. Negative for chest pain and palpitations.       Slight left vs right leg swelling, but no pitting of foot ankle as she reports she had last week  Gastrointestinal:  Negative for abdominal pain, constipation, diarrhea, nausea and vomiting.  Genitourinary:  Negative for dysuria, frequency and urgency.  Musculoskeletal:  Positive for gait problem.       Not as steady today  Skin:  Negative for color change.  Neurological:  Negative for dizziness and weakness.   Psychiatric/Behavioral:  Negative for confusion, hallucinations and sleep disturbance. The patient is nervous/anxious.        Anxiety    Physical Exam: Vitals:   12/06/21 1231  BP: 118/72  Pulse: 68  Resp: 18  SpO2: 97%   There is no height or weight on file to calculate BMI. Wt Readings from Last 500 Encounters:  12/01/21 118 lb 8 oz (53.8 kg)  11/10/21 118 lb 6 oz (53.7 kg)  10/21/21 116 lb 8 oz (52.8 kg)  10/19/21 116 lb (  52.6 kg)  10/07/21 116 lb 0.3 oz (52.6 kg)  09/29/21 119 lb (54 kg)  09/12/21 115 lb 9.6 oz (52.4 kg)  09/08/21 115 lb 3.2 oz (52.3 kg)  08/18/21 113 lb 3.2 oz (51.3 kg)  07/28/21 115 lb 11.2 oz (52.5 kg)  07/27/21 114 lb 3.2 oz (51.8 kg)  07/20/21 116 lb (52.6 kg)  07/07/21 116 lb (52.6 kg)  06/21/21 116 lb 3.2 oz (52.7 kg)  06/17/21 117 lb 12.8 oz (53.4 kg)  06/02/21 117 lb 2 oz (53.1 kg)  05/26/21 118 lb 9.6 oz (53.8 kg)  05/05/21 117 lb 9.6 oz (53.3 kg)  05/05/21 117 lb 3.2 oz (53.2 kg)  04/28/21 115 lb 9.6 oz (52.4 kg)  04/26/21 117 lb 1 oz (53.1 kg)  04/12/21 118 lb (53.5 kg)  04/07/21 118 lb 6.4 oz (53.7 kg)  03/29/21 122 lb (55.3 kg)  03/24/21 120 lb 3.2 oz (54.5 kg)  03/18/21 124 lb 12.8 oz (56.6 kg)  03/17/21 122 lb 12.8 oz (55.7 kg)  03/16/21 122 lb 7.2 oz (55.5 kg)  03/14/21 123 lb 12.8 oz (56.2 kg)  03/10/21 125 lb 1 oz (56.7 kg)  03/04/21 125 lb (56.7 kg)  02/23/21 130 lb 3.2 oz (59.1 kg)  02/13/21 129 lb 10.1 oz (58.8 kg)  02/07/21 127 lb (57.6 kg)  02/07/21 127 lb 1.6 oz (57.7 kg)  02/02/21 129 lb 1.6 oz (58.6 kg)  12/27/20 129 lb 6 oz (58.7 kg)  11/17/20 131 lb (59.4 kg)  11/11/20 131 lb (59.4 kg)  10/27/20 131 lb 4.8 oz (59.6 kg)  10/13/20 132 lb 6.4 oz (60.1 kg)  09/13/20 131 lb (59.4 kg)  08/16/20 135 lb 9.6 oz (61.5 kg)  06/30/20 135 lb (61.2 kg)  06/15/20 134 lb 3.2 oz (60.9 kg)  05/18/20 133 lb 9.6 oz (60.6 kg)  05/04/20 132 lb 8 oz (60.1 kg)  03/19/20 132 lb (59.9 kg)  03/15/20 134 lb (60.8 kg)  03/09/20 132  lb 9.6 oz (60.1 kg)  02/11/20 133 lb (60.3 kg)  01/13/20 130 lb 14.4 oz (59.4 kg)  12/17/19 130 lb (59 kg)  12/02/19 132 lb 4.8 oz (60 kg)  12/02/19 129 lb 12.8 oz (58.9 kg)  11/12/19 133 lb 6.4 oz (60.5 kg)  10/21/19 132 lb 4.4 oz (60 kg)  09/30/19 133 lb 3.2 oz (60.4 kg)  09/17/19 131 lb (59.4 kg)  09/01/19 131 lb 12.8 oz (59.8 kg)  08/20/19 132 lb (59.9 kg)  08/19/19 133 lb (60.3 kg)  07/22/19 133 lb 2.5 oz (60.4 kg)  07/16/19 129 lb (58.5 kg)  07/03/19 132 lb 9.6 oz (60.1 kg)  06/17/19 132 lb 12.8 oz (60.2 kg)  05/05/19 131 lb 4.8 oz (59.6 kg)  04/09/19 129 lb (58.5 kg)  03/24/19 132 lb 3.2 oz (60 kg)  02/10/19 133 lb 4.8 oz (60.5 kg)  01/09/19 133 lb 1.6 oz (60.4 kg)  12/26/18 130 lb (59 kg)  11/22/18 131 lb (59.4 kg)  11/20/18 132 lb 4.8 oz (60 kg)  11/07/18 129 lb (58.5 kg)  10/23/18 129 lb (58.5 kg)  09/03/18 131 lb 12.8 oz (59.8 kg)  08/21/18 132 lb (59.9 kg)  08/05/18 132 lb 3.2 oz (60 kg)  07/24/18 128 lb (58.1 kg)  07/02/18 131 lb 8 oz (59.6 kg)  06/24/18 130 lb 9.6 oz (59.2 kg)  06/03/18 131 lb 1.6 oz (59.5 kg)  05/22/18 133 lb (60.3 kg)  04/30/18 133 lb 12.8 oz (60.7 kg)  04/17/18 131  lb (59.4 kg)  04/02/18 131 lb 6.4 oz (59.6 kg)  03/13/18 130 lb (59 kg)  02/25/18 130 lb 9.6 oz (59.2 kg)  02/13/18 130 lb (59 kg)  02/12/18 130 lb 3.2 oz (59.1 kg)  01/22/18 130 lb 3.2 oz (59.1 kg)  01/16/18 126 lb (57.2 kg)  01/15/18 127 lb (57.6 kg)  01/15/18 126 lb 8 oz (57.4 kg)  01/03/18 128 lb 6.4 oz (58.2 kg)  01/01/18 126 lb 12.8 oz (57.5 kg)  12/27/17 126 lb 12.8 oz (57.5 kg)  12/18/17 127 lb 3.2 oz (57.7 kg)  04/25/15 155 lb (70.3 kg)  10/07/14 155 lb 12.8 oz (70.7 kg)  02/15/13 148 lb (67.1 kg)   Physical Exam Constitutional:      General: She is not in acute distress.    Appearance: She is ill-appearing. She is not toxic-appearing.     Comments: Cachectic, frail  HENT:     Head: Normocephalic and atraumatic.  Eyes:     Pupils: Pupils are equal,  round, and reactive to light.     Comments: glasses  Cardiovascular:     Rate and Rhythm: Normal rate and regular rhythm.  Pulmonary:     Effort: Pulmonary effort is normal.     Breath sounds: Rhonchi present. No wheezing or rales.  Abdominal:     General: Bowel sounds are normal.  Musculoskeletal:        General: Normal range of motion.     Right lower leg: No edema.     Left lower leg: Edema present.  Lymphadenopathy:     Cervical: No cervical adenopathy.  Skin:    General: Skin is warm and dry.     Comments: Right lateral proximal thigh >1cm firm nodule subcutaneously, irregular, nontender  Neurological:     General: No focal deficit present.     Mental Status: She is alert and oriented to person, place, and time.     Sensory: Sensory deficit present.     Motor: No weakness.     Gait: Gait abnormal.     Comments: Did forget a few things today which is not usually the case for her  Psychiatric:        Mood and Affect: Mood normal.     Comments: Tremulous and sometimes affecting word finding, pausing during speech     CURRENT PROBLEM LIST:  Patient Active Problem List   Diagnosis Date Noted   Anemia 10/07/2021   Neutropenia ([place]) 10/07/2021   Pseudophakia of right eye 09/22/2021   Port-A-Cath in place 07/07/2021   Protein-calorie malnutrition, severe 04/25/2021   Pneumothorax after biopsy 04/21/2021   Lung nodule 04/01/2021   Epiretinal membrane, right eye 02/28/2021   Nuclear sclerotic cataract of left eye 02/28/2021   Coronary artery disease of native artery of native heart with stable angina pectoris (HCC)    CVA (cerebral vascular accident) ([place]) 02/12/2021   Current use of long term anticoagulation 02/12/2021   Hypokalemia 02/12/2021   Leg DVT (deep venous thromboembolism), acute, bilateral ([place]) 02/08/2021   Aortic atherosclerosis ([place]) 02/08/2021   Respiratory failure ([place]) 02/08/2021   Postobstructive pneumonia 02/07/2021   Pulmonary embolism ([place])  02/07/2021   Elevated troponin 02/07/2021   Encounter to establish care 02/02/2021   Shortness of breath 02/02/2021   Cough 02/02/2021   Statin myopathy 12/03/2020   Acute non-recurrent frontal sinusitis 11/18/2020   NSCLC with EGFR mutation ([place]) 06/30/2020   Hx of chest tube placement 02/11/2020   History of chest tube placement  11/12/2019   Abnormal liver function 08/20/2019   Gastroesophageal reflux disease without esophagitis 08/20/2019   Generalized anxiety disorder 08/20/2019   Glaucoma 08/20/2019   Hyperlipidemia 08/20/2019   Menopausal syndrome 08/20/2019   Raynaud's phenomenon 08/20/2019   Vitamin D deficiency 08/20/2019   Status post coronary artery stent placement    History of non-ST elevation myocardial infarction (NSTEMI) 06/12/2019   Hypertension 03/24/2019   Encounter for antineoplastic chemotherapy 01/22/2018   Adenocarcinoma of right lung, stage 4 ([place]) 01/04/2018   Goals of care, counseling/discussion 01/04/2018   Thyroid nodule 12/11/2017   Pleural effusion, right 12/11/2017   Insomnia 04/20/2014    PAST MEDICAL HISTORY:  Active Ambulatory Problems    Diagnosis Date Noted   Thyroid nodule 12/11/2017   Pleural effusion, right 12/11/2017   Adenocarcinoma of right lung, stage 4 ([place]) 01/04/2018   Goals of care, counseling/discussion 01/04/2018   Encounter for antineoplastic chemotherapy 01/22/2018   Hypertension 03/24/2019   History of non-ST elevation myocardial infarction (NSTEMI) 06/12/2019   Status post coronary artery stent placement    Abnormal liver function 08/20/2019   Gastroesophageal reflux disease without esophagitis 08/20/2019   Generalized anxiety disorder 08/20/2019   Glaucoma 08/20/2019   Hyperlipidemia 08/20/2019   Insomnia 04/20/2014   Menopausal syndrome 08/20/2019   Raynaud's phenomenon 08/20/2019   Vitamin D deficiency 08/20/2019   History of chest tube placement 11/12/2019   Hx of chest tube placement 02/11/2020   NSCLC with  EGFR mutation ([place]) 06/30/2020   Acute non-recurrent frontal sinusitis 11/18/2020   Statin myopathy 12/03/2020   Encounter to establish care 02/02/2021   Shortness of breath 02/02/2021   Cough 02/02/2021   Postobstructive pneumonia 02/07/2021   Pulmonary embolism ([place]) 02/07/2021   Elevated troponin 02/07/2021   Leg DVT (deep venous thromboembolism), acute, bilateral ([place]) 02/08/2021   Aortic atherosclerosis ([place]) 02/08/2021   Respiratory failure ([place]) 02/08/2021   CVA (cerebral vascular accident) ([place]) 02/12/2021   Current use of long term anticoagulation 02/12/2021   Hypokalemia 02/12/2021   Coronary artery disease of native artery of native heart with stable angina pectoris ([place])    Epiretinal membrane, right eye 02/28/2021   Nuclear sclerotic cataract of left eye 02/28/2021   Lung nodule 04/01/2021   Pneumothorax after biopsy 04/21/2021   Protein-calorie malnutrition, severe 04/25/2021   Port-A-Cath in place 07/07/2021   Pseudophakia of right eye 09/22/2021   Anemia 10/07/2021   Neutropenia ([place]) 10/07/2021   Resolved Ambulatory Problems    Diagnosis Date Noted   Mass of upper lobe of right lung 12/10/2016   Pulmonary nodules 12/11/2017   Hypokalemia    History of precordial chest pain 07/16/2019   Nuclear sclerotic cataract of right eye 02/28/2021   Past Medical History:  Diagnosis Date   Anxiety    Arthritis    Asthma    Depression    Dyspnea    GERD (gastroesophageal reflux disease)    History of radiation therapy 01/05/2021   lung ca dx'd 11/2017   Malignant pleural effusion    PONV (postoperative nausea and vomiting)    Pre-diabetes    Raynaud's disease    Raynaud's disease    Stroke ([place]) 01/2021    SOCIAL HX:  Social History   Tobacco Use   Smoking status: Never   Smokeless tobacco: Never  Substance Use Topics   Alcohol use: Not Currently    Comment: up to 3 drinks per week     ALLERGIES:  Allergies  Allergen Reactions   Penicillins Other  (  See Comments)    [facility] PATIENT HAS HAD A PCN REACTION WITH IMMEDIATE RASH, FACIAL/TONGUE/THROAT SWELLING, SOB, OR LIGHTHEADEDNESS WITH HYPOTENSION:  #  #  YES  #  # Has patient had a PCN reaction causing severe rash involving mucus membranes or skin necrosis: No Has patient had a PCN reaction that required hospitalization: No Has patient had a PCN reaction occurring within the last 10 years: No    Vicodin [Hydrocodone-Acetaminophen] Other (See Comments)    Sped up heart and breathing   Other Other (See Comments)    Glaucoma eye drop - turned eyes dark      PERTINENT MEDICATIONS:  Outpatient Encounter Medications as of 12/06/2021  Medication Sig   FLUoxetine (PROZAC) 20 MG capsule Take 1 capsule (20 mg total) by mouth daily.   acetaminophen (TYLENOL) 500 MG tablet Take 1,000 mg by mouth every 6 (six) hours as needed (pain).   ALPRAZolam (XANAX) 0.25 MG tablet Take 1 tablet (0.25 mg total) by mouth 3 (three) times daily as needed for anxiety. May take 2 tablets (0.5 mg total) at bedtime.   aspirin 81 MG chewable tablet Chew 1 tablet (81 mg total) by mouth daily.   brompheniramine-pseudoephedrine-DM 30-2-10 MG/5ML syrup Take 5 mLs by mouth 3 (three) times daily as needed.   carvedilol (COREG) 12.5 MG tablet Take 1 tablet (12.5 mg total) by mouth 2 (two) times daily.   cetirizine (ZYRTEC) 5 MG tablet Take 5 mg by mouth daily.   dexamethasone (DECADRON) 4 MG tablet Take 1 tablet twice a day the day before, the day of, and the day after chemotherapy   dorzolamide-timolol (COSOPT) 22.3-6.8 MG/ML ophthalmic solution Place 1 drop into both eyes 2 (two) times daily.   dronabinol (MARINOL) 2.5 MG capsule Take 1 capsule (2.5 mg total) by mouth 2 (two) times daily as needed (decreased appetite).   enoxaparin (LOVENOX) 60 MG/0.6ML injection Inject 0.6 mLs (60 mg total) into the skin every 12 (twelve) hours. (Patient taking differently: Inject 80 mg into the skin daily. 80 mg)   esomeprazole (NEXIUM)  20 MG capsule Take 1 capsule (20 mg total) by mouth in the morning and at bedtime.   folic acid (FOLVITE) 1 MG tablet TAKE 1 TABLET BY MOUTH EVERY DAY   hyoscyamine (LEVSIN SL) 0.125 MG SL tablet Place 2 tablets (0.25 mg total) under the tongue every 6 (six) hours as needed for cramping. Take 1-2 tablets under the tongue every 6 hours as needed for cramping   ipratropium-albuterol (DUONEB) 0.5-2.5 (3) MG/3ML SOLN Take 3 mLs by nebulization every 6 (six) hours as needed (Asthma).   Iron-FA-B Cmp-C-Biot-Probiotic (FUSION PLUS PO) Take 1 capsule by mouth daily.   isosorbide mononitrate (IMDUR) 30 MG 24 hr tablet Take 30 mg by mouth daily.   latanoprost (XALATAN) 0.005 % ophthalmic solution Place 1 drop into both eyes at bedtime.    ondansetron (ZOFRAN-ODT) 4 MG disintegrating tablet Take 1 tablet (4 mg total) by mouth every 8 (eight) hours as needed for nausea or vomiting.   osimertinib mesylate (TAGRISSO) 80 MG tablet Take 1 tablet (80 mg total) by mouth daily.   potassium chloride SA (KLOR-CON M) 20 MEQ tablet Take 1 tablet (20 mEq total) by mouth daily.   REPATHA SURECLICK 326 MG/ML SOAJ Inject 140 mg into the skin every 14 (fourteen) days.   [DISCONTINUED] FLUoxetine (PROZAC) 10 MG capsule Take 2 capsules (20 mg total) by mouth daily.   No facility-administered encounter medications on file as of 12/06/2021.  Thank you for the opportunity to participate in the care of [name].  The palliative care team will continue to follow. Please call our office at [phone] if we can be of additional assistance.   [name], DO  COVID-19 PATIENT SCREENING TOOL Asked and negative response unless otherwise noted:  Have you had symptoms of covid, tested positive or been in contact with someone with symptoms/positive test in the past 5-10 days? no

## 2021-12-07 DIAGNOSIS — J479 Bronchiectasis, uncomplicated: Secondary | ICD-10-CM | POA: Diagnosis not present

## 2021-12-08 DIAGNOSIS — I69328 Other speech and language deficits following cerebral infarction: Secondary | ICD-10-CM | POA: Diagnosis not present

## 2021-12-08 DIAGNOSIS — E559 Vitamin D deficiency, unspecified: Secondary | ICD-10-CM | POA: Diagnosis not present

## 2021-12-08 DIAGNOSIS — E876 Hypokalemia: Secondary | ICD-10-CM | POA: Diagnosis not present

## 2021-12-08 DIAGNOSIS — E785 Hyperlipidemia, unspecified: Secondary | ICD-10-CM | POA: Diagnosis not present

## 2021-12-08 DIAGNOSIS — Z8701 Personal history of pneumonia (recurrent): Secondary | ICD-10-CM | POA: Diagnosis not present

## 2021-12-08 DIAGNOSIS — G4700 Insomnia, unspecified: Secondary | ICD-10-CM | POA: Diagnosis not present

## 2021-12-08 DIAGNOSIS — R69 Illness, unspecified: Secondary | ICD-10-CM | POA: Diagnosis not present

## 2021-12-08 DIAGNOSIS — H2512 Age-related nuclear cataract, left eye: Secondary | ICD-10-CM | POA: Diagnosis not present

## 2021-12-08 DIAGNOSIS — H409 Unspecified glaucoma: Secondary | ICD-10-CM | POA: Diagnosis not present

## 2021-12-08 DIAGNOSIS — I82403 Acute embolism and thrombosis of unspecified deep veins of lower extremity, bilateral: Secondary | ICD-10-CM | POA: Diagnosis not present

## 2021-12-08 DIAGNOSIS — I2510 Atherosclerotic heart disease of native coronary artery without angina pectoris: Secondary | ICD-10-CM | POA: Diagnosis not present

## 2021-12-08 DIAGNOSIS — I7300 Raynaud's syndrome without gangrene: Secondary | ICD-10-CM | POA: Diagnosis not present

## 2021-12-08 DIAGNOSIS — C3491 Malignant neoplasm of unspecified part of right bronchus or lung: Secondary | ICD-10-CM | POA: Diagnosis not present

## 2021-12-08 DIAGNOSIS — K219 Gastro-esophageal reflux disease without esophagitis: Secondary | ICD-10-CM | POA: Diagnosis not present

## 2021-12-08 DIAGNOSIS — I252 Old myocardial infarction: Secondary | ICD-10-CM | POA: Diagnosis not present

## 2021-12-08 DIAGNOSIS — I2699 Other pulmonary embolism without acute cor pulmonale: Secondary | ICD-10-CM | POA: Diagnosis not present

## 2021-12-08 DIAGNOSIS — I10 Essential (primary) hypertension: Secondary | ICD-10-CM | POA: Diagnosis not present

## 2021-12-13 ENCOUNTER — Ambulatory Visit (HOSPITAL_COMMUNITY)
Admission: RE | Admit: 2021-12-13 | Discharge: 2021-12-13 | Disposition: A | Discharge status: Home / Self Care | Payer: Medicare HMO | Source: Ambulatory Visit | Attending: Physician Assistant | Admitting: Physician Assistant

## 2021-12-13 DIAGNOSIS — C7800 Secondary malignant neoplasm of unspecified lung: Secondary | ICD-10-CM | POA: Diagnosis not present

## 2021-12-13 DIAGNOSIS — L989 Disorder of the skin and subcutaneous tissue, unspecified: Secondary | ICD-10-CM | POA: Diagnosis not present

## 2021-12-13 DIAGNOSIS — R2241 Localized swelling, mass and lump, right lower limb: Secondary | ICD-10-CM | POA: Diagnosis not present

## 2021-12-15 ENCOUNTER — Telehealth: Payer: Self-pay | Admitting: Physician Assistant

## 2021-12-15 NOTE — Telephone Encounter (Signed)
I reviewed her US of the subcutaneous lesion with the patient. I also reviewed with Dr. Julien Nordmann. Dr. Julien Nordmann recommends monitoring for now. If this area continues to enlarge, then we can arrange US guided core biopsy of the lesion. The patient is in agreement with the plan. She is scheduled to see Dr. Julien Nordmann on 12/22/21. Encouraged her to have him and I look at it at that time for a baseline in the event if gets larger over time.

## 2021-12-22 ENCOUNTER — Inpatient Hospital Stay: Payer: Medicare HMO

## 2021-12-22 ENCOUNTER — Other Ambulatory Visit: Payer: Self-pay

## 2021-12-22 ENCOUNTER — Encounter: Payer: Self-pay | Admitting: Nurse Practitioner

## 2021-12-22 ENCOUNTER — Other Ambulatory Visit: Payer: Medicare HMO

## 2021-12-22 ENCOUNTER — Inpatient Hospital Stay (HOSPITAL_BASED_OUTPATIENT_CLINIC_OR_DEPARTMENT_OTHER): Payer: Medicare HMO | Admitting: Nurse Practitioner

## 2021-12-22 ENCOUNTER — Inpatient Hospital Stay: Payer: Medicare HMO | Attending: Internal Medicine | Admitting: Internal Medicine

## 2021-12-22 VITALS — BP 140/70 | HR 66 | Temp 98.6°F | Resp 17 | Wt 117.1 lb

## 2021-12-22 DIAGNOSIS — K30 Functional dyspepsia: Secondary | ICD-10-CM | POA: Insufficient documentation

## 2021-12-22 DIAGNOSIS — R530 Neoplastic (malignant) related fatigue: Secondary | ICD-10-CM | POA: Diagnosis not present

## 2021-12-22 DIAGNOSIS — J910 Malignant pleural effusion: Secondary | ICD-10-CM | POA: Diagnosis not present

## 2021-12-22 DIAGNOSIS — C771 Secondary and unspecified malignant neoplasm of intrathoracic lymph nodes: Secondary | ICD-10-CM | POA: Insufficient documentation

## 2021-12-22 DIAGNOSIS — Z79899 Other long term (current) drug therapy: Secondary | ICD-10-CM | POA: Diagnosis not present

## 2021-12-22 DIAGNOSIS — K219 Gastro-esophageal reflux disease without esophagitis: Secondary | ICD-10-CM | POA: Insufficient documentation

## 2021-12-22 DIAGNOSIS — Z923 Personal history of irradiation: Secondary | ICD-10-CM | POA: Insufficient documentation

## 2021-12-22 DIAGNOSIS — C3491 Malignant neoplasm of unspecified part of right bronchus or lung: Secondary | ICD-10-CM

## 2021-12-22 DIAGNOSIS — I10 Essential (primary) hypertension: Secondary | ICD-10-CM | POA: Insufficient documentation

## 2021-12-22 DIAGNOSIS — C7800 Secondary malignant neoplasm of unspecified lung: Secondary | ICD-10-CM | POA: Diagnosis not present

## 2021-12-22 DIAGNOSIS — R630 Anorexia: Secondary | ICD-10-CM | POA: Diagnosis not present

## 2021-12-22 DIAGNOSIS — F419 Anxiety disorder, unspecified: Secondary | ICD-10-CM | POA: Insufficient documentation

## 2021-12-22 DIAGNOSIS — C3411 Malignant neoplasm of upper lobe, right bronchus or lung: Secondary | ICD-10-CM | POA: Insufficient documentation

## 2021-12-22 DIAGNOSIS — I2510 Atherosclerotic heart disease of native coronary artery without angina pectoris: Secondary | ICD-10-CM | POA: Diagnosis not present

## 2021-12-22 DIAGNOSIS — Z515 Encounter for palliative care: Secondary | ICD-10-CM | POA: Insufficient documentation

## 2021-12-22 DIAGNOSIS — Z66 Do not resuscitate: Secondary | ICD-10-CM | POA: Insufficient documentation

## 2021-12-22 DIAGNOSIS — Z8673 Personal history of transient ischemic attack (TIA), and cerebral infarction without residual deficits: Secondary | ICD-10-CM | POA: Insufficient documentation

## 2021-12-22 DIAGNOSIS — R5383 Other fatigue: Secondary | ICD-10-CM | POA: Insufficient documentation

## 2021-12-22 DIAGNOSIS — R0609 Other forms of dyspnea: Secondary | ICD-10-CM | POA: Insufficient documentation

## 2021-12-22 DIAGNOSIS — R069 Unspecified abnormalities of breathing: Secondary | ICD-10-CM | POA: Diagnosis not present

## 2021-12-22 DIAGNOSIS — Z95828 Presence of other vascular implants and grafts: Secondary | ICD-10-CM

## 2021-12-22 DIAGNOSIS — C3490 Malignant neoplasm of unspecified part of unspecified bronchus or lung: Secondary | ICD-10-CM

## 2021-12-22 DIAGNOSIS — Z5111 Encounter for antineoplastic chemotherapy: Secondary | ICD-10-CM | POA: Insufficient documentation

## 2021-12-22 DIAGNOSIS — Z7901 Long term (current) use of anticoagulants: Secondary | ICD-10-CM | POA: Diagnosis not present

## 2021-12-22 LAB — CBC WITH DIFFERENTIAL (CANCER CENTER ONLY)
Abs Immature Granulocytes: 0.01 10*3/uL (ref 0.00–0.07)
Basophils Absolute: 0 10*3/uL (ref 0.0–0.1)
Basophils Relative: 0 %
Eosinophils Absolute: 0 10*3/uL (ref 0.0–0.5)
Eosinophils Relative: 0 %
HCT: 30.7 % — ABNORMAL LOW (ref 36.0–46.0)
Hemoglobin: 10 g/dL — ABNORMAL LOW (ref 12.0–15.0)
Immature Granulocytes: 0 %
Lymphocytes Relative: 16 %
Lymphs Abs: 0.6 10*3/uL — ABNORMAL LOW (ref 0.7–4.0)
MCH: 28.9 pg (ref 26.0–34.0)
MCHC: 32.6 g/dL (ref 30.0–36.0)
MCV: 88.7 fL (ref 80.0–100.0)
Monocytes Absolute: 0.4 10*3/uL (ref 0.1–1.0)
Monocytes Relative: 11 %
Neutro Abs: 2.7 10*3/uL (ref 1.7–7.7)
Neutrophils Relative %: 73 %
Platelet Count: 241 10*3/uL (ref 150–400)
RBC: 3.46 MIL/uL — ABNORMAL LOW (ref 3.87–5.11)
RDW: 13.2 % (ref 11.5–15.5)
WBC Count: 3.7 10*3/uL — ABNORMAL LOW (ref 4.0–10.5)
nRBC: 0 % (ref 0.0–0.2)

## 2021-12-22 LAB — CMP (CANCER CENTER ONLY)
ALT: 23 U/L (ref 0–44)
AST: 26 U/L (ref 15–41)
Albumin: 3.9 g/dL (ref 3.5–5.0)
Alkaline Phosphatase: 64 U/L (ref 38–126)
Anion gap: 7 (ref 5–15)
BUN: 22 mg/dL (ref 8–23)
CO2: 29 mmol/L (ref 22–32)
Calcium: 9.4 mg/dL (ref 8.9–10.3)
Chloride: 104 mmol/L (ref 98–111)
Creatinine: 1.35 mg/dL — ABNORMAL HIGH (ref 0.44–1.00)
GFR, Estimated: 42 mL/min — ABNORMAL LOW (ref >60)
Glucose, Bld: 122 mg/dL — ABNORMAL HIGH (ref 70–99)
Potassium: 3.3 mmol/L — ABNORMAL LOW (ref 3.5–5.1)
Sodium: 140 mmol/L (ref 135–145)
Total Bilirubin: 0.2 mg/dL — ABNORMAL LOW (ref 0.3–1.2)
Total Protein: 7.1 g/dL (ref 6.5–8.1)

## 2021-12-22 LAB — SAMPLE TO BLOOD BANK

## 2021-12-22 MED ORDER — ONDANSETRON HCL 4 MG/2ML IJ SOLN
8.0000 mg | Freq: Once | INTRAMUSCULAR | Status: AC
  Administered 2021-12-22: 8 mg via INTRAVENOUS
  Filled 2021-12-22: qty 4

## 2021-12-22 MED ORDER — SODIUM CHLORIDE 0.9 % IV SOLN: Freq: Once | INTRAVENOUS | Status: AC

## 2021-12-22 MED ORDER — HEPARIN SOD (PORK) LOCK FLUSH 100 UNIT/ML IV SOLN
500.0000 [IU] | Freq: Once | INTRAVENOUS | Status: AC | PRN
  Administered 2021-12-22: 500 [IU]

## 2021-12-22 MED ORDER — SODIUM CHLORIDE 0.9% FLUSH
10.0000 mL | Freq: Once | INTRAVENOUS | Status: AC
  Administered 2021-12-22: 10 mL

## 2021-12-22 MED ORDER — SODIUM CHLORIDE 0.9 % IV SOLN
400.0000 mg/m2 | Freq: Once | INTRAVENOUS | Status: AC
  Administered 2021-12-22: 600 mg via INTRAVENOUS
  Filled 2021-12-22: qty 20

## 2021-12-22 MED ORDER — SODIUM CHLORIDE 0.9% FLUSH
10.0000 mL | INTRAVENOUS | Status: DC | PRN
  Administered 2021-12-22: 10 mL

## 2021-12-22 NOTE — Progress Notes (Signed)
[place]  [phone]) [phone] [phone]) [phone]   Name: Allison [name] Date: 12/22/2021 MRN: [id number]  DOB: 07/12/1950  Patient Care Team: Allison Blade, MD as PCP - General (Internal Medicine) Allison Heinz, MD as PCP - Cardiology (Cardiology) Allison Pray, MD as Consulting Physician (Radiation Oncology) Pickenpack-Cousar, Carlena Sax, NP as Nurse Practitioner (Nurse Practitioner) Allison Hart, RN as Oncology Nurse Navigator (Oncology) Allison Bears, MD as Consulting Physician (Oncology) Allison Curry, DO (Geriatric Medicine)    INTERVAL HISTORY: Allison [name] is a 71 y.o. female with stage IV non-small cell lung cancer (12/2017), hypertension, CVA, CAD, DVT/PE (on Lovenox), and GERD.  Palliative ask to see for symptom management and goals of care.  SOCIAL HISTORY:     reports that she has never smoked. She has never used smokeless tobacco. She reports that she does not currently use alcohol. She reports that she does not use drugs.  ADVANCE DIRECTIVES:  None on file   CODE STATUS: DNR  PAST MEDICAL HISTORY: Past Medical History:  Diagnosis Date   Anemia    Anxiety    Arthritis    Asthma    exercise induced   Depression    PMH   Dyspnea    GERD (gastroesophageal reflux disease)    Glaucoma    History of radiation therapy 01/05/2021   IMRT right lung  11/24/2020-01/05/2021  Allison [name]   Hypertension    lung ca dx'd 11/2017   right   Malignant pleural effusion    right   Nuclear sclerotic cataract of right eye 02/28/2021   Allison. Warden [name], cataract surgery February 2023   PONV (postoperative nausea and vomiting)    Pre-diabetes    Raynaud's disease    Raynaud's disease    Stroke (Allison [name]) 01/2021   balance off, some express aphasia, weakness    ALLERGIES:  is allergic to penicillins, vicodin [hydrocodone-acetaminophen], and other.  MEDICATIONS:  Current Outpatient Medications   Medication Sig Dispense Refill   acetaminophen (TYLENOL) 500 MG tablet Take 1,000 mg by mouth every 6 (six) hours as needed (pain).     ALPRAZolam (XANAX) 0.25 MG tablet Take 1 tablet (0.25 mg total) by mouth 3 (three) times daily as needed for anxiety. May take 2 tablets (0.5 mg total) at bedtime. 60 tablet 0   aspirin 81 MG chewable tablet Chew 1 tablet (81 mg total) by mouth daily.     brompheniramine-pseudoephedrine-DM 30-2-10 MG/5ML syrup Take 5 mLs by mouth 3 (three) times daily as needed. 120 mL 0   carvedilol (COREG) 12.5 MG tablet Take 1 tablet (12.5 mg total) by mouth 2 (two) times daily. 180 tablet 1   cetirizine (ZYRTEC) 5 MG tablet Take 5 mg by mouth daily.     dexamethasone (DECADRON) 4 MG tablet Take 1 tablet twice a day the day before, the day of, and the day after chemotherapy 40 tablet 2   dorzolamide-timolol (COSOPT) 22.3-6.8 MG/ML ophthalmic solution Place 1 drop into both eyes 2 (two) times daily.     dronabinol (MARINOL) 2.5 MG capsule Take 1 capsule (2.5 mg total) by mouth 2 (two) times daily as needed (decreased appetite). 60 capsule 0   enoxaparin (LOVENOX) 60 MG/0.6ML injection Inject 0.6 mLs (60 mg total) into the skin every 12 (twelve) hours. (Patient taking differently: Inject 80 mg into the skin daily. 80 mg) 60 mL 1   esomeprazole (NEXIUM) 20 MG capsule Take 1 capsule (20 mg total) by  mouth in the morning and at bedtime. 60 capsule 2   FLUoxetine (PROZAC) 20 MG capsule Take 1 capsule (20 mg total) by mouth daily. 90 capsule 3   folic acid (FOLVITE) 1 MG tablet TAKE 1 TABLET BY MOUTH EVERY DAY 90 tablet 1   hyoscyamine (LEVSIN SL) 0.125 MG SL tablet Place 2 tablets (0.25 mg total) under the tongue every 6 (six) hours as needed for cramping. Take 1-2 tablets under the tongue every 6 hours as needed for cramping 30 tablet 0   ipratropium-albuterol (DUONEB) 0.5-2.5 (3) MG/3ML SOLN Take 3 mLs by nebulization every 6 (six) hours as needed (Asthma).     Iron-FA-B  Cmp-C-Biot-Probiotic (FUSION PLUS PO) Take 1 capsule by mouth daily.     isosorbide mononitrate (IMDUR) 30 MG 24 hr tablet Take 30 mg by mouth daily.     latanoprost (XALATAN) 0.005 % ophthalmic solution Place 1 drop into both eyes at bedtime.      ondansetron (ZOFRAN-ODT) 4 MG disintegrating tablet Take 1 tablet (4 mg total) by mouth every 8 (eight) hours as needed for nausea or vomiting. 30 tablet 1   osimertinib mesylate (TAGRISSO) 80 MG tablet Take 1 tablet (80 mg total) by mouth daily. 30 tablet 3   potassium chloride SA (KLOR-CON M) 20 MEQ tablet Take 1 tablet (20 mEq total) by mouth daily. 5 tablet 0   REPATHA SURECLICK 062 MG/ML SOAJ Inject 140 mg into the skin every 14 (fourteen) days. 6 mL 3   No current facility-administered medications for this visit.   Facility-Administered Medications Ordered in Other Visits  Medication Dose Route Frequency Provider Last Rate Last Admin   sodium chloride flush (NS) 0.9 % injection 10 mL  10 mL Intracatheter PRN Allison Bears, MD   10 mL at 12/22/21 1612    VITAL SIGNS: LMP  (LMP Unknown)  There were no vitals filed for this visit.  Estimated body mass index is 20.1 kg/m as calculated from the following:   Height as of 10/21/21: 5\' 4"  (1.626 m).   Weight as of an earlier encounter on 12/22/21: 53.1 kg.   PERFORMANCE STATUS (ECOG) : 1 - Symptomatic but completely ambulatory   Physical Exam General: NAD, sambulatory  Cardiovascular: RRR Pulmonary: normal breathing pattern  Extremities: no edema, no joint deformities Neurological: AAO x3, mood appropriate   IMPRESSION:  Allison [name] presents to clinic today for follow-up. Her significant other is with her. No acute distress noted. Ambulatory with cane. Continues to tolerate treatment and thrive.    Fatigue  Improving. Fluctuates pending on her activity level. Increased fatigue more noticeable in conjunction with her activity levels. Encouraged her to listen to her body and take  frequent rest breaks as needed. Fatigue is manageable. Does not interfere with her daily activities.    Anxiety Anxiety is improving. She is able to focus on relaxation techniques, focused breathing, and guided imagery. She is appreciative that her anxiety levels are decreasing. Able to sleep better throughout the night. She is not taking xanax as often as before. Continues on Prozac. She is also followed by home palliative with [name]. Will continue to take things day by day and practice relaxation and thought directing activities as she and [name] discussed.    Appetite Appetite continues to improve. Improvement in her indigestion. Tolerating the nexium. Education provided on continued increase in protein enriched foods. She is drinking Ensure daily. Discussed consuming greek yogurt, fruits, vegetables, and nuts which she is doing. Also had conversations of how  to be creative with protein drinks to provide more versatile options while also aiding in decrease in reflux.   Her weight is remaining stable.  PLAN: Symptoms are well controlled. Appetite is improving. She is being followed by the dietician.  Nexium twice daily for indigestion. Tolerating well.  Tolerating Prozac. Dosage increased to 20 mg per Allison. Mariea Clonts with [facility]. Anxiety improving. Not having to take xanax as frequently.  I will plan to see back in 6-8 weeks in collaboration with her other oncology appointments.    Patient expressed understanding and was in agreement with this plan. She also understands that She can call the clinic at any time with any questions, concerns, or complaints.     Time Total: 20 min   Visit consisted of counseling and education dealing with the complex and emotionally intense issues of symptom management and palliative care in the setting of serious and potentially life-threatening illness.Greater than 50%  of this time was spent counseling and coordinating care related to the above assessment and  plan.  Alda Lea, AGPCNP-BC  Palliative Medicine Team/[facility]

## 2021-12-22 NOTE — Progress Notes (Signed)
[place] [phone]) [phone]   [phone]) [phone]  OFFICE PROGRESS NOTE  [name], [place] [address]  DIAGNOSIS: Stage IV (T2 a,N2, M1a) non-small cell lung cancer, adenocarcinoma diagnosed in July 2019 and presented with right upper lobe lung mass in addition to mediastinal lymphadenopathy as well as bilateral pulmonary nodules and malignant right pleural effusion.   Biomarker Findings Microsatellite status - Cannot Be Determined Tumor Mutational Burden - Cannot Be Determined Genomic Findings For a complete list of the genes assayed, please refer to the Appendix. EGFR exon 19 deletion ([id number]) TP53 Y220C 7 Disease relevant genes with no reportable alterations: KRAS, ALK, BRAF, MET, RET, ERBB2, ROS1    PRIOR THERAPY:  1) Status post right Pleurx catheter placement by Dr. Prescott Gum for drainage of malignant right pleural effusion. 2) palliative radiotherapy to the enlarging right upper lobe lung mass and mediastinum under the care of Dr. Sondra Come expected to be completed on January 05, 2021. 3) Tagrisso 80 mg p.o. daily.  First dose was given on 01/29/2018.  Status post 39 months of treatment.   CURRENT THERAPY: Systemic chemotherapy with carboplatin for AUC of 5 and Alimta 500 Mg/M2 every 3 weeks.  First dose 03/17/2021.  The patient will also continue her current treatment with Tagrisso 80 mg p.o. daily.  She is status post 13 cycles. Starting from cycle #7, she will be on Alimta only 500 mg/m2.  INTERVAL HISTORY: Allison [name] 71 y.o. female returns to the clinic today for follow-up visit accompanied by her boyfriend.  The patient is feeling fine today with no concerning complaints except for mild fatigue and shortness of breath with exertion.  She denied having any chest pain, but has mild cough with no hemoptysis.  She has no weight loss or night sweats.  She has no nausea, vomiting, diarrhea or constipation.  She  has no headache or visual changes.  She notices a palpable nodule on the upper thigh that was examined with ultrasound of the soft tissue that was nonspecific.  The patient mentioned that the nodule is getting smaller in size. She is here today for evaluation before starting cycle #14 of her treatment.  MEDICAL HISTORY: Past Medical History:  Diagnosis Date   Anemia    Anxiety    Arthritis    Asthma    exercise induced   Depression    PMH   Dyspnea    GERD (gastroesophageal reflux disease)    Glaucoma    History of radiation therapy 01/05/2021   IMRT right lung  11/24/2020-01/05/2021  Dr [name]   Hypertension    lung ca dx'd 11/2017   right   Malignant pleural effusion    right   Nuclear sclerotic cataract of right eye 02/28/2021   Dr. Warden Fillers, cataract surgery February 2023   PONV (postoperative nausea and vomiting)    Pre-diabetes    Raynaud's disease    Raynaud's disease    Stroke ([place]) 01/2021   balance off, some express aphasia, weakness    ALLERGIES:  is allergic to penicillins, vicodin [hydrocodone-acetaminophen], and other.  MEDICATIONS:  Current Outpatient Medications  Medication Sig Dispense Refill   acetaminophen (TYLENOL) 500 MG tablet Take 1,000 mg by mouth every 6 (six) hours as needed (pain).     ALPRAZolam (XANAX) 0.25 MG tablet Take 1 tablet (0.25 mg total) by mouth 3 (three) times daily as needed for anxiety. May take 2 tablets (0.5 mg  total) at bedtime. 60 tablet 0   aspirin 81 MG chewable tablet Chew 1 tablet (81 mg total) by mouth daily.     brompheniramine-pseudoephedrine-DM 30-2-10 MG/5ML syrup Take 5 mLs by mouth 3 (three) times daily as needed. 120 mL 0   carvedilol (COREG) 12.5 MG tablet Take 1 tablet (12.5 mg total) by mouth 2 (two) times daily. 180 tablet 1   cetirizine (ZYRTEC) 5 MG tablet Take 5 mg by mouth daily.     dexamethasone (DECADRON) 4 MG tablet Take 1 tablet twice a day the day before, the day of, and the day after  chemotherapy 40 tablet 2   dorzolamide-timolol (COSOPT) 22.3-6.8 MG/ML ophthalmic solution Place 1 drop into both eyes 2 (two) times daily.     dronabinol (MARINOL) 2.5 MG capsule Take 1 capsule (2.5 mg total) by mouth 2 (two) times daily as needed (decreased appetite). 60 capsule 0   enoxaparin (LOVENOX) 60 MG/0.6ML injection Inject 0.6 mLs (60 mg total) into the skin every 12 (twelve) hours. (Patient taking differently: Inject 80 mg into the skin daily. 80 mg) 60 mL 1   esomeprazole (NEXIUM) 20 MG capsule Take 1 capsule (20 mg total) by mouth in the morning and at bedtime. 60 capsule 2   FLUoxetine (PROZAC) 20 MG capsule Take 1 capsule (20 mg total) by mouth daily. 90 capsule 3   folic acid (FOLVITE) 1 MG tablet TAKE 1 TABLET BY MOUTH EVERY DAY 90 tablet 1   hyoscyamine (LEVSIN SL) 0.125 MG SL tablet Place 2 tablets (0.25 mg total) under the tongue every 6 (six) hours as needed for cramping. Take 1-2 tablets under the tongue every 6 hours as needed for cramping 30 tablet 0   ipratropium-albuterol (DUONEB) 0.5-2.5 (3) MG/3ML SOLN Take 3 mLs by nebulization every 6 (six) hours as needed (Asthma).     Iron-FA-B Cmp-C-Biot-Probiotic (FUSION PLUS PO) Take 1 capsule by mouth daily.     isosorbide mononitrate (IMDUR) 30 MG 24 hr tablet Take 30 mg by mouth daily.     latanoprost (XALATAN) 0.005 % ophthalmic solution Place 1 drop into both eyes at bedtime.      ondansetron (ZOFRAN-ODT) 4 MG disintegrating tablet Take 1 tablet (4 mg total) by mouth every 8 (eight) hours as needed for nausea or vomiting. 30 tablet 1   osimertinib mesylate (TAGRISSO) 80 MG tablet Take 1 tablet (80 mg total) by mouth daily. 30 tablet 3   potassium chloride SA (KLOR-CON M) 20 MEQ tablet Take 1 tablet (20 mEq total) by mouth daily. 5 tablet 0   REPATHA SURECLICK 326 MG/ML SOAJ Inject 140 mg into the skin every 14 (fourteen) days. 6 mL 3   No current facility-administered medications for this visit.    SURGICAL HISTORY:  Past  Surgical History:  Procedure Laterality Date   ABDOMINAL HYSTERECTOMY     partial   BRONCHIAL BIOPSY  04/21/2021   Procedure: BRONCHIAL BIOPSIES;  Surgeon: [name], DO;  Location: [facility];  Service: Pulmonary;;   BRONCHIAL BRUSHINGS  04/21/2021   Procedure: BRONCHIAL BRUSHINGS;  Surgeon: [name], DO;  Location: [facility];  Service: Pulmonary;;   BRONCHIAL NEEDLE ASPIRATION BIOPSY  04/21/2021   Procedure: BRONCHIAL NEEDLE ASPIRATION BIOPSIES;  Surgeon: [name], DO;  Location: [facility];  Service: Pulmonary;;   CHEST TUBE INSERTION Right 01/01/2018   Procedure: INSERTION PLEURAL DRAINAGE CATHETER;  Surgeon: Ivin Poot, MD;  Location: [facility];  Service: Thoracic;  Laterality: Right;   CHEST TUBE  INSERTION  04/21/2021   Procedure: CHEST TUBE INSERTION;  Surgeon: [name], DO;  Location: [facility];  Service: Pulmonary;;   COLONOSCOPY     CORONARY STENT INTERVENTION N/A 06/16/2019   Procedure: CORONARY STENT INTERVENTION;  Surgeon: Jettie Booze, MD;  Location: [facility];  Service: Cardiovascular;  Laterality: N/A;   DILATION AND CURETTAGE OF UTERUS     EYE SURGERY     due to Glaucoma   IR IMAGING GUIDED PORT INSERTION  03/22/2021   IR PORT REPAIR CENTRAL VENOUS ACCESS DEVICE  04/15/2021   LEFT HEART CATH AND CORONARY ANGIOGRAPHY N/A 06/16/2019   Procedure: LEFT HEART CATH AND CORONARY ANGIOGRAPHY;  Surgeon: Jettie Booze, MD;  Location: [facility];  Service: Cardiovascular;  Laterality: N/A;   REMOVAL OF PLEURAL DRAINAGE CATHETER Right 11/07/2018   Procedure: REMOVAL OF PLEURAL DRAINAGE CATHETER;  Surgeon: Ivin Poot, MD;  Location: [facility];  Service: Thoracic;  Laterality: Right;   ROTATOR CUFF REPAIR     TUBAL LIGATION     VIDEO BRONCHOSCOPY WITH ENDOBRONCHIAL NAVIGATION Bilateral 04/21/2021   Procedure: VIDEO BRONCHOSCOPY WITH ENDOBRONCHIAL NAVIGATION;  Surgeon: [name], DO;  Location: [facility];   Service: Pulmonary;  Laterality: Bilateral;  ION   WISDOM TOOTH EXTRACTION      REVIEW OF SYSTEMS:  A comprehensive review of systems was negative except for: Constitutional: positive for fatigue Respiratory: positive for cough and dyspnea on exertion   PHYSICAL EXAMINATION: General appearance: alert, cooperative, appears stated age, fatigued, and no distress Head: Normocephalic, without obvious abnormality, atraumatic Neck: no adenopathy, no JVD, supple, symmetrical, trachea midline, and thyroid not enlarged, symmetric, no tenderness/mass/nodules Lymph nodes: Cervical, supraclavicular, and axillary nodes normal. Resp: clear to auscultation bilaterally Back: symmetric, no curvature. ROM normal. No CVA tenderness. Cardio: regular rate and rhythm, S1, S2 normal, no murmur, click, rub or gallop GI: soft, non-tender; bowel sounds normal; no masses,  no organomegaly Extremities: extremities normal, atraumatic, no cyanosis or edema  ECOG PERFORMANCE STATUS: 1 - Symptomatic but completely ambulatory  Blood pressure 140/70, pulse 66, temperature 98.6 F (37 C), temperature source Tympanic, resp. rate 17, weight 117 lb 2 oz (53.1 kg), SpO2 100 %.  LABORATORY DATA: Lab Results  Component Value Date   WBC 3.7 (L) 12/22/2021   HGB 10.0 (L) 12/22/2021   HCT 30.7 (L) 12/22/2021   MCV 88.7 12/22/2021   PLT 241 12/22/2021      Chemistry      Component Value Date/Time   NA 139 12/01/2021 0947   NA 140 03/04/2021 1516   K 3.2 (L) 12/01/2021 0947   CL 105 12/01/2021 0947   CO2 27 12/01/2021 0947   BUN 17 12/01/2021 0947   BUN 21 03/04/2021 1516   CREATININE 1.18 (H) 12/01/2021 0947      Component Value Date/Time   CALCIUM 9.7 12/01/2021 0947   ALKPHOS 67 12/01/2021 0947   AST 38 12/01/2021 0947   ALT 44 12/01/2021 0947   BILITOT 0.2 (L) 12/01/2021 0947       RADIOGRAPHIC STUDIES: [place] RT LOWER EXTREM LTD SOFT TISSUE NON VASCULAR  Result Date: 12/14/2021 CLINICAL DATA:  Metastatic  lung cancer, right thigh mass EXAM: ULTRASOUND RIGHT LOWER EXTREMITY LIMITED TECHNIQUE: Ultrasound examination of the lower extremity soft tissues was performed in the area of clinical concern. COMPARISON:  None Available. FINDINGS: Limited grayscale and color Doppler sonography demonstrates a a subcutaneous mixed solid and cystic mass measuring 2.4 x 2.1 by  0.8 cm in greatest dimension demonstrating a small cystic component within the subcutaneous soft tissues of the right thigh corresponding to the reported palpable abnormality. This is seen immediately superficial to the skeletal muscle of the anterior right thigh in this location and does not appear to traverse the investing muscular fascia. No significant associated vascularity is identified. This is nonspecific but may represent a partially necrotic lymph node, subcutaneous hematoma, or post inflammatory collection. Cystic metastasis is not completely excluded though lack of significant vascularity makes this consideration less likely. IMPRESSION: 2.4 cm mixed solid and cystic mass within the subcutaneous soft tissues of the anterior right thigh corresponding to the reported palpable abnormality. This is nonspecific but may represent a partially necrotic lymph node, subcutaneous hematoma, or post inflammatory collection. Cystic metastasis is not completely excluded though lack of significant vascularity makes this consideration less likely. Tissue sampling may be helpful for further evaluation. Electronically Signed   By: Fidela Salisbury M.D.   On: 12/14/2021 20:15    ASSESSMENT AND PLAN: This is a very pleasant 71 years old never smoker African-American female recently with a stage IV non-small cell lung cancer, adenocarcinoma with positive EGFR mutation with deletion in exon 19. The patient was started on treatment with Tagrisso 80 mg p.o. daily status post 35 months of treatment. She has been tolerating this treatment well with no concerning adverse  effects except for intermittent diarrhea. She had repeat CT scan of the chest, abdomen pelvis performed recently.  I personally and independently reviewed the scans and discussed the results with the patient and her boyfriend today. Unfortunately the CT scan showed interval progression of the right apical lung mass in addition to progression of mediastinal lymphadenopathy concerning for worsening of her disease. She has no actionable resistant mutation on the molecular studies performed by [facility]. The patient continued her current treatment with Tagrisso and tolerating it fairly well. 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 Sondra Come completed January 05, 2021. The patient had significant opacities in her lung that was initially thought to be secondary to radiation treatment versus Tagrisso induced pneumonitis versus lymphangitic spread of the tumor.  She was treated with high-dose taper regiment of prednisone Repeat imaging studies after the palliative radiotherapy showed evidence for disease progression. Her molecular studies by [facility] recently showed no new resistant mutation.  After discussion of her treatment options including palliative care and hospice referral versus palliative systemic chemotherapy the patient was interested in proceeding palliative systemic chemotherapy.  She started  palliative systemic chemotherapy with carboplatin for AUC of 5 and Alimta 500 Mg/M2 every 3 weeks.  Status post 13 cycles.  Starting from cycle #7 the patient is on treatment with single agent Alimta every 3 weeks. I did not add a Avastin to her treatment because of the recent stroke.  She also continued her treatment with Tagrisso at the same time.  She has been tolerating this treatment well with no concerning adverse effects except for fatigue. I recommended for her to continue her treatment as  planned and she will proceed with cycle #14 of Alimta today. I will see her back for follow-up visit in 3 weeks for evaluation with repeat CT scan of the chest, abdomen and pelvis for restaging of her disease. For the right thigh subcutaneous nodule, we will continue to monitor it closely. The patient was advised to call immediately if she has any other  concerning symptoms in the interval.  The patient voices understanding of current disease status and treatment options and is in agreement with the current care plan.  All questions were answered. The patient knows to call the clinic with any problems, questions or concerns. We can certainly see the patient much sooner if necessary.  he total time spent in the appointment was 20 minutes.  Disclaimer: This note was dictated with voice recognition software. Similar sounding words can inadvertently be transcribed and may not be corrected upon review.

## 2021-12-22 NOTE — Progress Notes (Signed)
Per Dr Julien Nordmann, ok to treat today with CrCl 36ml/min

## 2021-12-22 NOTE — Patient Instructions (Signed)
[facility]   Discharge Instructions: Thank you for choosing [facility] to provide your oncology and hematology care.   If you have a lab appointment with the [facility], please go directly to the [facility] and check in at the registration area.   Wear comfortable clothing and clothing appropriate for easy access to any Portacath or PICC line.   We strive to give you quality time with your provider. You may need to reschedule your appointment if you arrive late (15 or more minutes).  Arriving late affects you and other patients whose appointments are after yours.  Also, if you miss three or more appointments without notifying the office, you may be dismissed from the clinic at the provider's discretion.      For prescription refill requests, have your pharmacy contact our office and allow 72 hours for refills to be completed.    Today you received the following chemotherapy and/or immunotherapy agents: pemetrexed      To help prevent nausea and vomiting after your treatment, we encourage you to take your nausea medication as directed.  BELOW ARE SYMPTOMS THAT SHOULD BE REPORTED IMMEDIATELY: *FEVER GREATER THAN 100.4 F (38 C) OR HIGHER *CHILLS OR SWEATING *NAUSEA AND VOMITING THAT IS NOT CONTROLLED WITH YOUR NAUSEA MEDICATION *UNUSUAL SHORTNESS OF BREATH *UNUSUAL BRUISING OR BLEEDING *URINARY PROBLEMS (pain or burning when urinating, or frequent urination) *BOWEL PROBLEMS (unusual diarrhea, constipation, pain near the anus) TENDERNESS IN MOUTH AND THROAT WITH OR WITHOUT PRESENCE OF ULCERS (sore throat, sores in mouth, or a toothache) UNUSUAL RASH, SWELLING OR PAIN  UNUSUAL VAGINAL DISCHARGE OR ITCHING   Items with * indicate a potential emergency and should be followed up as soon as possible or go to the Emergency Department if any problems should occur.  Please show the CHEMOTHERAPY ALERT CARD or IMMUNOTHERAPY ALERT CARD at check-in  to the Emergency Department and triage nurse.  Should you have questions after your visit or need to cancel or reschedule your appointment, please contact [facility]  Dept: [phone]  and follow the prompts.  Office hours are 8:00 a.m. to 4:30 p.m. Monday - Friday. Please note that voicemails left after 4:00 p.m. may not be returned until the following business day.  We are closed weekends and major holidays. You have access to a nurse at all times for urgent questions. Please call the main number to the clinic Dept: [phone] and follow the prompts.   For any non-urgent questions, you may also contact your provider using MyChart. We now offer e-Visits for anyone 53 and older to request care online for non-urgent symptoms. For details visit [email or web address].   Also download the MyChart app! Go to the app store, search "MyChart", open the app, select [facility], and log in with your MyChart username and password.  Masks are optional in the cancer centers. If you would like for your care team to wear a mask while they are taking care of you, please let them know. For doctor visits, patients may have with them one support person who is at least 71 years old. At this time, visitors are not allowed in the infusion area.

## 2021-12-28 ENCOUNTER — Other Ambulatory Visit: Payer: Self-pay | Admitting: Cardiology

## 2022-01-02 DIAGNOSIS — H401131 Primary open-angle glaucoma, bilateral, mild stage: Secondary | ICD-10-CM | POA: Diagnosis not present

## 2022-01-02 DIAGNOSIS — H35371 Puckering of macula, right eye: Secondary | ICD-10-CM | POA: Diagnosis not present

## 2022-01-02 DIAGNOSIS — H16223 Keratoconjunctivitis sicca, not specified as Sjogren's, bilateral: Secondary | ICD-10-CM | POA: Diagnosis not present

## 2022-01-02 DIAGNOSIS — Z961 Presence of intraocular lens: Secondary | ICD-10-CM | POA: Diagnosis not present

## 2022-01-02 DIAGNOSIS — H2512 Age-related nuclear cataract, left eye: Secondary | ICD-10-CM | POA: Diagnosis not present

## 2022-01-02 DIAGNOSIS — I639 Cerebral infarction, unspecified: Secondary | ICD-10-CM | POA: Diagnosis not present

## 2022-01-04 ENCOUNTER — Telehealth: Payer: Self-pay | Admitting: Internal Medicine

## 2022-01-04 NOTE — Telephone Encounter (Signed)
Called patient regarding upcoming July, August and September appointments. Patient is notified.

## 2022-01-06 DIAGNOSIS — J479 Bronchiectasis, uncomplicated: Secondary | ICD-10-CM | POA: Diagnosis not present

## 2022-01-09 NOTE — Progress Notes (Signed)
[facility] OFFICE PROGRESS NOTE  [name], [place] [address]  DIAGNOSIS: Stage IV (T2 a,N2, M1a) non-small cell lung cancer, adenocarcinoma diagnosed in July 2019 and presented with right upper lobe lung mass in addition to mediastinal lymphadenopathy as well as bilateral pulmonary nodules and malignant right pleural effusion.   Biomarker Findings Microsatellite status - Cannot Be Determined Tumor Mutational Burden - Cannot Be Determined Genomic Findings For a complete list of the genes assayed, please refer to the Appendix. EGFR exon 19 deletion ([id number]) TP53 Y220C 7 Disease relevant genes with no reportable alterations: KRAS, ALK, BRAF, MET, RET, ERBB2, ROS1   PRIOR THERAPY: 1) Status post right Pleurx catheter placement by Dr. Prescott Gum for drainage of malignant right pleural effusion. 2) palliative radiotherapy to the enlarging right upper lobe lung mass and mediastinum under the care of Dr. Sondra Come expected to be completed on January 05, 2021. 3) Tagrisso 80 mg p.o. daily.  First dose was given on 01/29/2018.  Status post 39 months of treatment.  CURRENT THERAPY: Systemic chemotherapy with carboplatin for AUC of 5 and Alimta 500 Mg/M2 every 3 weeks.  First dose 03/17/2021.  The patient will also continue her current treatment with Tagrisso 80 mg p.o. daily.  She is status post 14 cycles. Starting from cycle #7, she will be on Alimta only 500 mg/m2.  Alimta was eventually reduced to 400 mg per metered squared due to renal insufficiency  INTERVAL HISTORY: Allison [name] 71 y.o. female returns to the clinic today for a follow-up visit accompanied by her significant other. The patient is currently undergoing maintenance treatment with Alimta.  She is worried about her kidney function and had questions about the dose and frequency of chemotherapy with Alimta. She denies any fever or night sweats.  She has gained 2 pounds since her  last appointment which she is pleased about.  She has been increasing her snacking and eating small frequent meals.  Her weight is stable.  She is no longer taking Ensure and she is no longer taking Marinol.  Regarding her breathing, she has occasional chest tightness for which she takes Tylenol.  She also uses of vibrating vest to help break up mucus secretions in her lung which is helpful for her.  She reports baseline dyspnea on exertion which she thinks may be worse with the worsening heat/humidity outside.  From talking to one of her friends, she is wondering if she should be switched to Trelegy instead of using her nebulizers.  She follows closely with Dr. Valeta Harms from [facility] pulmonary medicine.  She continues to have a cough.  Denies any nausea, vomiting, or diarrhea.she tried taking Slow Fe for her anemia but reports this caused constipation.  She is going to try another over-the-counter iron supplement but otherwise would be interested in a prescription iron.  Denies any headache or visual changes.  The patient recently had a restaging CT scan performed.  She is here today for evaluation to review her scan results before starting cycle #15.   MEDICAL HISTORY: Past Medical History:  Diagnosis Date   Anemia    Anxiety    Arthritis    Asthma    exercise induced   Depression    PMH   Dyspnea    GERD (gastroesophageal reflux disease)    Glaucoma    History of radiation therapy 01/05/2021   IMRT right lung  11/24/2020-01/05/2021  Dr [name]   Hypertension  lung ca dx'd 11/2017   right   Malignant pleural effusion    right   Nuclear sclerotic cataract of right eye 02/28/2021   Dr. Warden Fillers, cataract surgery February 2023   PONV (postoperative nausea and vomiting)    Pre-diabetes    Raynaud's disease    Raynaud's disease    Stroke ([facility]) 01/2021   balance off, some express aphasia, weakness    ALLERGIES:  is allergic to penicillins, vicodin [hydrocodone-acetaminophen], and  other.  MEDICATIONS:  Current Outpatient Medications  Medication Sig Dispense Refill   FeFum-FePoly-FA-B Cmp-C-Biot (INTEGRA PLUS) CAPS Take 1 capsule by mouth every morning. 30 capsule 2   acetaminophen (TYLENOL) 500 MG tablet Take 1,000 mg by mouth every 6 (six) hours as needed (pain).     ALPRAZolam (XANAX) 0.25 MG tablet Take 1 tablet (0.25 mg total) by mouth 3 (three) times daily as needed for anxiety. May take 2 tablets (0.5 mg total) at bedtime. 60 tablet 0   aspirin 81 MG chewable tablet Chew 1 tablet (81 mg total) by mouth daily.     brompheniramine-pseudoephedrine-DM 30-2-10 MG/5ML syrup Take 5 mLs by mouth 3 (three) times daily as needed. 120 mL 0   carvedilol (COREG) 12.5 MG tablet TAKE 1 TABLET BY MOUTH 2 TIMES DAILY. 180 tablet 1   cetirizine (ZYRTEC) 5 MG tablet Take 5 mg by mouth daily.     dexamethasone (DECADRON) 4 MG tablet Take 1 tablet twice a day the day before, the day of, and the day after chemotherapy 40 tablet 2   dorzolamide-timolol (COSOPT) 22.3-6.8 MG/ML ophthalmic solution Place 1 drop into both eyes 2 (two) times daily.     dronabinol (MARINOL) 2.5 MG capsule Take 1 capsule (2.5 mg total) by mouth 2 (two) times daily as needed (decreased appetite). 60 capsule 0   enoxaparin (LOVENOX) 60 MG/0.6ML injection Inject 0.6 mLs (60 mg total) into the skin every 12 (twelve) hours. (Patient taking differently: Inject 80 mg into the skin daily. 80 mg) 60 mL 1   esomeprazole (NEXIUM) 20 MG capsule Take 1 capsule (20 mg total) by mouth in the morning and at bedtime. 60 capsule 2   FLUoxetine (PROZAC) 20 MG capsule Take 1 capsule (20 mg total) by mouth daily. 90 capsule 3   folic acid (FOLVITE) 1 MG tablet TAKE 1 TABLET BY MOUTH EVERY DAY 90 tablet 1   hyoscyamine (LEVSIN SL) 0.125 MG SL tablet Place 2 tablets (0.25 mg total) under the tongue every 6 (six) hours as needed for cramping. Take 1-2 tablets under the tongue every 6 hours as needed for cramping 30 tablet 0    ipratropium-albuterol (DUONEB) 0.5-2.5 (3) MG/3ML SOLN Take 3 mLs by nebulization every 6 (six) hours as needed (Asthma).     isosorbide mononitrate (IMDUR) 30 MG 24 hr tablet Take 30 mg by mouth daily.     latanoprost (XALATAN) 0.005 % ophthalmic solution Place 1 drop into both eyes at bedtime.      ondansetron (ZOFRAN-ODT) 4 MG disintegrating tablet Take 1 tablet (4 mg total) by mouth every 8 (eight) hours as needed for nausea or vomiting. 30 tablet 1   osimertinib mesylate (TAGRISSO) 80 MG tablet Take 1 tablet (80 mg total) by mouth daily. 30 tablet 3   potassium chloride SA (KLOR-CON M) 20 MEQ tablet Take 1 tablet (20 mEq total) by mouth daily. 5 tablet 0   REPATHA SURECLICK 981 MG/ML SOAJ INJECT 140 MG INTO THE SKIN EVERY 14 (FOURTEEN) DAYS. 6 mL 3  No current facility-administered medications for this visit.   Facility-Administered Medications Ordered in Other Visits  Medication Dose Route Frequency Provider Last Rate Last Admin   heparin lock flush 100 unit/mL  500 Units Intracatheter Once PRN Curt Bears, MD       PEMEtrexed (ALIMTA) 600 mg in sodium chloride 0.9 % 100 mL chemo infusion  400 mg/m2 (Treatment Plan Recorded) Intravenous Once Curt Bears, MD       sodium chloride flush (NS) 0.9 % injection 10 mL  10 mL Intracatheter PRN Curt Bears, MD        SURGICAL HISTORY:  Past Surgical History:  Procedure Laterality Date   ABDOMINAL HYSTERECTOMY     partial   BRONCHIAL BIOPSY  04/21/2021   Procedure: BRONCHIAL BIOPSIES;  Surgeon: [name], DO;  Location: [facility];  Service: Pulmonary;;   BRONCHIAL BRUSHINGS  04/21/2021   Procedure: BRONCHIAL BRUSHINGS;  Surgeon: [name], DO;  Location: [facility];  Service: Pulmonary;;   BRONCHIAL NEEDLE ASPIRATION BIOPSY  04/21/2021   Procedure: BRONCHIAL NEEDLE ASPIRATION BIOPSIES;  Surgeon: [name], DO;  Location: [facility];  Service: Pulmonary;;   CHEST TUBE INSERTION Right 01/01/2018    Procedure: INSERTION PLEURAL DRAINAGE CATHETER;  Surgeon: Ivin Poot, MD;  Location: [facility];  Service: Thoracic;  Laterality: Right;   CHEST TUBE INSERTION  04/21/2021   Procedure: CHEST TUBE INSERTION;  Surgeon: [name], DO;  Location: [facility];  Service: Pulmonary;;   COLONOSCOPY     CORONARY STENT INTERVENTION N/A 06/16/2019   Procedure: CORONARY STENT INTERVENTION;  Surgeon: Jettie Booze, MD;  Location: [facility];  Service: Cardiovascular;  Laterality: N/A;   DILATION AND CURETTAGE OF UTERUS     EYE SURGERY     due to Glaucoma   IR IMAGING GUIDED PORT INSERTION  03/22/2021   IR PORT REPAIR CENTRAL VENOUS ACCESS DEVICE  04/15/2021   LEFT HEART CATH AND CORONARY ANGIOGRAPHY N/A 06/16/2019   Procedure: LEFT HEART CATH AND CORONARY ANGIOGRAPHY;  Surgeon: Jettie Booze, MD;  Location: [facility];  Service: Cardiovascular;  Laterality: N/A;   REMOVAL OF PLEURAL DRAINAGE CATHETER Right 11/07/2018   Procedure: REMOVAL OF PLEURAL DRAINAGE CATHETER;  Surgeon: Ivin Poot, MD;  Location: [facility];  Service: Thoracic;  Laterality: Right;   ROTATOR CUFF REPAIR     TUBAL LIGATION     VIDEO BRONCHOSCOPY WITH ENDOBRONCHIAL NAVIGATION Bilateral 04/21/2021   Procedure: VIDEO BRONCHOSCOPY WITH ENDOBRONCHIAL NAVIGATION;  Surgeon: [name], DO;  Location: [facility];  Service: Pulmonary;  Laterality: Bilateral;  ION   WISDOM TOOTH EXTRACTION      REVIEW OF SYSTEMS:   Review of Systems  Constitutional: Positive for fatigue with overactivity.  Negative for appetite change, chills,fever and unexpected weight change.  HENT:   Negative for mouth sores, nosebleeds, sore throat and trouble swallowing.   Eyes: Negative for eye problems and icterus.  Respiratory: Positive for stable dyspnea with overactivity.  Positive for cough.  Negative for hemoptysis and wheezing.   Cardiovascular: Negative for chest pain and leg swelling.  Gastrointestinal: Positive  for occasional constipation with iron supplement.  Negative for abdominal pain, diarrhea, nausea and vomiting.  Genitourinary: Negative for bladder incontinence, difficulty urinating, dysuria, frequency and hematuria.   Musculoskeletal: Negative for back pain, gait problem, neck pain and neck stiffness.  Skin: Negative for itching and rash.  Neurological: Negative for dizziness, extremity weakness, gait problem, headaches, light-headedness and seizures.  Hematological:  Negative for adenopathy. Does not bruise/bleed easily.  Psychiatric/Behavioral: Negative for confusion, depression and sleep disturbance. The patient is not nervous/anxious.     PHYSICAL EXAMINATION:  Blood pressure (!) 150/69, pulse 71, temperature 97.9 F (36.6 C), resp. rate 18, weight 119 lb 6.4 oz (54.2 kg), SpO2 100 %.  ECOG PERFORMANCE STATUS: 1  Physical Exam  Constitutional: Oriented to person, place, and time and thin appearing female and in no distress.  HENT:  Head: Normocephalic and atraumatic.  Mouth/Throat: Oropharynx is clear and moist. No oropharyngeal exudate.  Eyes: Conjunctivae are normal. Right eye exhibits no discharge. Left eye exhibits no discharge. No scleral icterus.  Neck: Normal range of motion. Neck supple.  Cardiovascular: Normal rate, regular rhythm, normal heart sounds and intact distal pulses.   Pulmonary/Chest: Effort normal and breath sounds normal. No respiratory distress. No wheezes. No rales.  Abdominal: Soft. Bowel sounds are normal. Exhibits no distension and no mass. There is no tenderness.  Musculoskeletal: Normal range of motion. Exhibits no edema.  Lymphadenopathy:    No cervical adenopathy.  Neurological: Alert and oriented to person, place, and time.  Muscle wasting.  Gait normal. Coordination normal.  Skin: Mild linear area of erythema on right abdomen. Skin is warm and dry. No rash noted. Not diaphoretic. No erythema. No pallor.  Psychiatric: Mood, memory and judgment  normal.  Vitals reviewed.  LABORATORY DATA: Lab Results  Component Value Date   WBC 3.6 (L) 01/12/2022   HGB 9.6 (L) 01/12/2022   HCT 30.0 (L) 01/12/2022   MCV 87.0 01/12/2022   PLT 213 01/12/2022      Chemistry      Component Value Date/Time   NA 141 01/12/2022 1003   NA 140 03/04/2021 1516   K 3.7 01/12/2022 1003   CL 107 01/12/2022 1003   CO2 26 01/12/2022 1003   BUN 19 01/12/2022 1003   BUN 21 03/04/2021 1516   CREATININE 1.29 (H) 01/12/2022 1003      Component Value Date/Time   CALCIUM 9.2 01/12/2022 1003   ALKPHOS 57 01/12/2022 1003   AST 25 01/12/2022 1003   ALT 17 01/12/2022 1003   BILITOT 0.2 (L) 01/12/2022 1003       RADIOGRAPHIC STUDIES:  CT CHEST ABDOMEN PELVIS W CONTRAST  Result Date: 01/10/2022 CLINICAL DATA:  Status post radiation therapy for lung cancer. On chemotherapy. Chest tube. Hysterectomy. Small-cell primary. * Tracking Code: BO * EXAM: CT CHEST, ABDOMEN, AND PELVIS WITH CONTRAST TECHNIQUE: Multidetector CT imaging of the chest, abdomen and pelvis was performed following the standard protocol during bolus administration of intravenous contrast. RADIATION DOSE REDUCTION: This exam was performed according to the departmental dose-optimization program which includes automated exposure control, adjustment of the mA and/or kV according to patient size and/or use of iterative reconstruction technique. CONTRAST:  77m OMNIPAQUE IOHEXOL 300 MG/ML  SOLN COMPARISON:  11/07/2021 chest CT.  09/06/2021 abdominopelvic CT. FINDINGS: CT CHEST FINDINGS Cardiovascular: Right Port-A-Cath terminates at mid right atrium. Aortic atherosclerosis. Normal heart size, without pericardial effusion. Right coronary artery calcification. No central pulmonary embolism, on this non-dedicated study. Mediastinum/Nodes: No supraclavicular adenopathy. Presumably radiation induced mediastinal edema again identified, without well-defined adenopathy. No hilar adenopathy. Lungs/Pleura: Trace  right-sided pleural fluid is similar. The right apical lung nodule is again difficult to delineate from surrounding radiation induced consolidation. Measures on the order of 1.6 x 1.4 cm on 28/4 versus 2.0 x 1.6 cm on the prior exam. Smooth septal thickening within the right lung base is unchanged, favored  to be radiation induced. Similar configuration of right apical, medial right upper lobe, and superior segment right lower lobe geographic consolidation with air bronchograms. 3 mm left lower lobe calcified granuloma. Musculoskeletal: No acute osseous abnormality. CT ABDOMEN PELVIS FINDINGS Hepatobiliary: Normal liver. Tiny dependent gallstone. No acute cholecystitis or biliary duct dilatation. Pancreas: Normal, without mass or ductal dilatation. Spleen: Normal in size, without focal abnormality. Adrenals/Urinary Tract: Normal adrenal glands. Lower pole right renal scarring. Right renal too small to characterize lesions. Normal left kidney. No hydronephrosis. Normal urinary bladder. Stomach/Bowel: Normal stomach, without wall thickening. Normal colon and terminal ileum. Normal small bowel. Vascular/Lymphatic: Aortic atherosclerosis. No abdominopelvic adenopathy. Reproductive: Hysterectomy.  No adnexal mass. Other: No significant free fluid. Mild pelvic floor laxity. No evidence of omental or peritoneal disease. Musculoskeletal: Presumably iatrogenic subcutaneous nodularity and gas about the anterior abdominopelvic wall. The L2 left pedicle sclerotic lesion is less distinct today. Example 57/2. Trace L5-S1 anterolisthesis is similar. Transitional S1 vertebral body. IMPRESSION: 1. Similar appearance of radiation change throughout the right upper lobe and superior segment right lower lobe. The right apical lung nodule is difficult to differentiate from surrounding consolidation, but felt to be slightly decreased in size compared to 11/07/2021. 2. Similar small right pleural effusion. 3. No findings of soft tissue  metastatic disease within the chest, abdomen, or pelvis. 4. Previously described left L2 pedicle lesion is less distinct today, likely representing response to therapy of osseous metastasis. 5. Aortic atherosclerosis (ICD10-I70.0), coronary artery atherosclerosis and emphysema (ICD10-J43.9). 6. Cholelithiasis Electronically Signed   By: Abigail Miyamoto M.D.   On: 01/10/2022 17:59   [place] RT LOWER EXTREM LTD SOFT TISSUE NON VASCULAR  Result Date: 12/14/2021 CLINICAL DATA:  Metastatic lung cancer, right thigh mass EXAM: ULTRASOUND RIGHT LOWER EXTREMITY LIMITED TECHNIQUE: Ultrasound examination of the lower extremity soft tissues was performed in the area of clinical concern. COMPARISON:  None Available. FINDINGS: Limited grayscale and color Doppler sonography demonstrates a a subcutaneous mixed solid and cystic mass measuring 2.4 x 2.1 by 0.8 cm in greatest dimension demonstrating a small cystic component within the subcutaneous soft tissues of the right thigh corresponding to the reported palpable abnormality. This is seen immediately superficial to the skeletal muscle of the anterior right thigh in this location and does not appear to traverse the investing muscular fascia. No significant associated vascularity is identified. This is nonspecific but may represent a partially necrotic lymph node, subcutaneous hematoma, or post inflammatory collection. Cystic metastasis is not completely excluded though lack of significant vascularity makes this consideration less likely. IMPRESSION: 2.4 cm mixed solid and cystic mass within the subcutaneous soft tissues of the anterior right thigh corresponding to the reported palpable abnormality. This is nonspecific but may represent a partially necrotic lymph node, subcutaneous hematoma, or post inflammatory collection. Cystic metastasis is not completely excluded though lack of significant vascularity makes this consideration less likely. Tissue sampling may be helpful for further  evaluation. Electronically Signed   By: Fidela Salisbury M.D.   On: 12/14/2021 20:15     ASSESSMENT/PLAN:  This is a very pleasant 71 year old never smoker African-American female diagnosed with stage IV non-small cell lung cancer, adenocarcinoma.  She was positive for an EGFR mutation with deletion in exon 19.  She was diagnosed in July 2019 and presented with right upper lobe lung mass in addition to mediastinal lymphadenopathy as well as bilateral pulmonary nodules and malignant right pleural effusion.   The patient was started on treatment with Tagrisso 80 mg p.o. daily  status post 39 months of treatment. Started on 01/29/2018.   In April 2022, she showed evidence of disease progression with interval progression of the right apical lung mass in addition to progression of mediastinal lymphadenopathy concerning for worsening of her disease. She had repeat Guardant 360 molecular studies which did not show evidence of new resistant mutations.  Therefore, she was referred to radiation oncology and completed palliative radiotherapy to the enlarging right upper lobe lung mass and mediastinum under the care of Dr. Sondra Come.  This was completed in July 2022.   Unfortunately, the patient recently was found to have evidence of disease progression.  Therefore, Dr. Julien Nordmann started the patient on systemic chemotherapy with carboplatin for AUC of 5 and Alimta 500 mg per metered squared.  She is status post 14 cycles and she tolerated it fairly well despite her ongoing issues with fatigue, chest tightness, cough, and weight loss.  Alimta was reduced to 400 mg per metered squared due to renal insufficiency.  She is not a good candidate for Avastin due to her recent CVA in August 2022.  She also is continuing to take Tagrisso as it is protective against progressive metastatic disease to the brain. Starting from cycle #7,  she started maintenance single agent alimta.   The patient underwent a repeat bronchoscopy and biopsy  for repeat molecular testing.  The sample from the left and right lung biopsy was negative for malignancy. This was performed in November 2022.    The patient saw Dr. Durenda Hurt from [facility] for second opinion in November 2022.  They discussed if she progresses on chemotherapy that there may be upcoming options. He discussed other treatment options which may be available in 2023 including patritumab deruxtcan and lazertinib/amivantmab. If these are not available, he wrote that they can determine if they can get the agents through an expanded access, single patient IND, or clinical trial if she has progression with chemotherapy.    Patient recently had a restaging CT scan performed.  Dr. Julien Nordmann personally and independently reviewed the scan and discussed the results with the patient today.  The scan showed no evidence of disease progression.  Dr. Julien Nordmann recommends that she continue on the same treatment at the same dose.  She will proceed with cycle #15 today of Alimta 400 mg per metered squared as scheduled.  The patient is overdue for her vitamin B12 injection.  We reach out to pharmacy who is going to adjust her care plan.  Reviewed with the patient that the dosing for Alimta is every 3 weeks as the data suggests.  We do not have data to suggest giving her chemotherapy at longer intervals (i.e. every 4 weeks) would be beneficial or effective.   We will see her back for follow-up visit in 3 weeks for evaluation and repeat blood work before starting cycle #16.  She will continue using her nebulizers and Mucinex for her cough and shortness of breath.  For her questions regarding her COPD management, she will reach out to Dr. Juline Patch office about his thoughts on Trelegy, which the patient inquired about after speaking to a friend.  Dr. Julien Nordmann encouraged the patient to continue to hydrate well and avoid nephrotoxic over-the-counter medicine such as Aleve or ibuprofen.  I sent the patient a  prescription for Integra plus to see if this helps reduce her GI side effects from the iron supplement.  The patient was advised to call immediately if she has any concerning symptoms in the interval. The  patient voices understanding of current disease status and treatment options and is in agreement with the current care plan. All questions were answered. The patient knows to call the clinic with any problems, questions or concerns. We can certainly see the patient much sooner if necessary    No orders of the defined types were placed in this encounter.      Allison Seeman L Maleko Greulich, PA-C 01/12/22  ADDENDUM: Hematology/Oncology Attending: I had a face-to-face encounter with the patient today.  I reviewed her records, lab, scan and recommended her care plan.  This is a very pleasant 71 years old African-American female with a stage IV non-small cell lung cancer, adenocarcinoma diagnosed in July 2019 was positive EGFR mutation with exon 19 deletion status post treatment with Tagrisso for 39 months before the patient has disease progression and she was switched to a course of palliative radiotherapy to the enlarging right upper lobe lung mass and mediastinum completed in July 2022.  She also has evidence for disease progression after the radiation and started palliative systemic chemotherapy with initially carboplatin and Alimta for 6 cycles and currently on maintenance treatment with single agent Alimta for 8 more cycles.  The patient has been tolerating her treatment well except for fatigue.  She continues to have baseline shortness of breath and cough from the previous radiotherapy. She had repeat CT scan of the chest, abdomen and pelvis performed recently.  I personally and independently reviewed the scan and discussed the result with the patient and her boyfriend and her daughter who was available by phone during the visit. The scan showed no concerning findings for disease progression. I  recommended for her to continue her current maintenance treatment with single agent Alimta in addition to [place]. She will proceed with cycle #15 today. She will come back for follow-up visit in 3 weeks for evaluation before the next cycle of her treatment. The patient was advised to call immediately if she has any other concerning symptoms in the interval. The total time spent in the appointment was 30 minutes. Disclaimer: This note was dictated with voice recognition software. Similar sounding words can inadvertently be transcribed and may be missed upon review. Alexios Keown L Collette Pescador, PA-C

## 2022-01-10 ENCOUNTER — Ambulatory Visit (HOSPITAL_BASED_OUTPATIENT_CLINIC_OR_DEPARTMENT_OTHER)
Admission: RE | Admit: 2022-01-10 | Discharge: 2022-01-10 | Disposition: A | Discharge status: Home / Self Care | Payer: Medicare HMO | Source: Ambulatory Visit | Attending: Internal Medicine | Admitting: Internal Medicine

## 2022-01-10 DIAGNOSIS — R911 Solitary pulmonary nodule: Secondary | ICD-10-CM | POA: Diagnosis not present

## 2022-01-10 DIAGNOSIS — C3490 Malignant neoplasm of unspecified part of unspecified bronchus or lung: Secondary | ICD-10-CM | POA: Diagnosis not present

## 2022-01-10 DIAGNOSIS — J90 Pleural effusion, not elsewhere classified: Secondary | ICD-10-CM | POA: Diagnosis not present

## 2022-01-10 DIAGNOSIS — N2889 Other specified disorders of kidney and ureter: Secondary | ICD-10-CM | POA: Diagnosis not present

## 2022-01-10 DIAGNOSIS — J439 Emphysema, unspecified: Secondary | ICD-10-CM | POA: Diagnosis not present

## 2022-01-10 DIAGNOSIS — K8020 Calculus of gallbladder without cholecystitis without obstruction: Secondary | ICD-10-CM | POA: Diagnosis not present

## 2022-01-10 DIAGNOSIS — R918 Other nonspecific abnormal finding of lung field: Secondary | ICD-10-CM | POA: Diagnosis not present

## 2022-01-10 MED ORDER — HEPARIN SOD (PORK) LOCK FLUSH 100 UNIT/ML IV SOLN
500.0000 [IU] | Freq: Once | INTRAVENOUS | Status: AC
  Administered 2022-01-10: 500 [IU] via INTRAVENOUS

## 2022-01-10 MED ORDER — IOHEXOL 300 MG/ML  SOLN
100.0000 mL | Freq: Once | INTRAMUSCULAR | Status: AC | PRN
  Administered 2022-01-10: 80 mL via INTRAVENOUS

## 2022-01-12 ENCOUNTER — Inpatient Hospital Stay: Payer: Medicare HMO | Admitting: Physician Assistant

## 2022-01-12 ENCOUNTER — Inpatient Hospital Stay: Payer: Medicare HMO

## 2022-01-12 ENCOUNTER — Other Ambulatory Visit: Payer: Self-pay

## 2022-01-12 ENCOUNTER — Ambulatory Visit: Payer: Medicare HMO | Admitting: Physician Assistant

## 2022-01-12 VITALS — BP 110/59 | HR 66 | Resp 18

## 2022-01-12 VITALS — BP 150/69 | HR 71 | Temp 97.9°F | Resp 18 | Wt 119.4 lb

## 2022-01-12 DIAGNOSIS — F419 Anxiety disorder, unspecified: Secondary | ICD-10-CM | POA: Diagnosis not present

## 2022-01-12 DIAGNOSIS — K219 Gastro-esophageal reflux disease without esophagitis: Secondary | ICD-10-CM | POA: Diagnosis not present

## 2022-01-12 DIAGNOSIS — Z5111 Encounter for antineoplastic chemotherapy: Secondary | ICD-10-CM

## 2022-01-12 DIAGNOSIS — Z8673 Personal history of transient ischemic attack (TIA), and cerebral infarction without residual deficits: Secondary | ICD-10-CM | POA: Diagnosis not present

## 2022-01-12 DIAGNOSIS — K30 Functional dyspepsia: Secondary | ICD-10-CM | POA: Diagnosis not present

## 2022-01-12 DIAGNOSIS — J910 Malignant pleural effusion: Secondary | ICD-10-CM | POA: Diagnosis not present

## 2022-01-12 DIAGNOSIS — I2510 Atherosclerotic heart disease of native coronary artery without angina pectoris: Secondary | ICD-10-CM | POA: Diagnosis not present

## 2022-01-12 DIAGNOSIS — R0609 Other forms of dyspnea: Secondary | ICD-10-CM | POA: Diagnosis not present

## 2022-01-12 DIAGNOSIS — R5383 Other fatigue: Secondary | ICD-10-CM | POA: Diagnosis not present

## 2022-01-12 DIAGNOSIS — R630 Anorexia: Secondary | ICD-10-CM | POA: Diagnosis not present

## 2022-01-12 DIAGNOSIS — C3411 Malignant neoplasm of upper lobe, right bronchus or lung: Secondary | ICD-10-CM | POA: Diagnosis not present

## 2022-01-12 DIAGNOSIS — C3491 Malignant neoplasm of unspecified part of right bronchus or lung: Secondary | ICD-10-CM

## 2022-01-12 DIAGNOSIS — Z95828 Presence of other vascular implants and grafts: Secondary | ICD-10-CM

## 2022-01-12 DIAGNOSIS — Z515 Encounter for palliative care: Secondary | ICD-10-CM | POA: Diagnosis not present

## 2022-01-12 DIAGNOSIS — Z66 Do not resuscitate: Secondary | ICD-10-CM | POA: Diagnosis not present

## 2022-01-12 DIAGNOSIS — Z79899 Other long term (current) drug therapy: Secondary | ICD-10-CM | POA: Diagnosis not present

## 2022-01-12 DIAGNOSIS — C771 Secondary and unspecified malignant neoplasm of intrathoracic lymph nodes: Secondary | ICD-10-CM | POA: Diagnosis not present

## 2022-01-12 DIAGNOSIS — Z923 Personal history of irradiation: Secondary | ICD-10-CM | POA: Diagnosis not present

## 2022-01-12 DIAGNOSIS — R069 Unspecified abnormalities of breathing: Secondary | ICD-10-CM | POA: Diagnosis not present

## 2022-01-12 DIAGNOSIS — C7800 Secondary malignant neoplasm of unspecified lung: Secondary | ICD-10-CM | POA: Diagnosis not present

## 2022-01-12 DIAGNOSIS — D509 Iron deficiency anemia, unspecified: Secondary | ICD-10-CM | POA: Diagnosis not present

## 2022-01-12 DIAGNOSIS — I10 Essential (primary) hypertension: Secondary | ICD-10-CM | POA: Diagnosis not present

## 2022-01-12 DIAGNOSIS — Z7901 Long term (current) use of anticoagulants: Secondary | ICD-10-CM | POA: Diagnosis not present

## 2022-01-12 LAB — CBC WITH DIFFERENTIAL (CANCER CENTER ONLY)
Abs Immature Granulocytes: 0.02 10*3/uL (ref 0.00–0.07)
Basophils Absolute: 0 10*3/uL (ref 0.0–0.1)
Basophils Relative: 0 %
Eosinophils Absolute: 0 10*3/uL (ref 0.0–0.5)
Eosinophils Relative: 0 %
HCT: 30 % — ABNORMAL LOW (ref 36.0–46.0)
Hemoglobin: 9.6 g/dL — ABNORMAL LOW (ref 12.0–15.0)
Immature Granulocytes: 1 %
Lymphocytes Relative: 15 %
Lymphs Abs: 0.5 10*3/uL — ABNORMAL LOW (ref 0.7–4.0)
MCH: 27.8 pg (ref 26.0–34.0)
MCHC: 32 g/dL (ref 30.0–36.0)
MCV: 87 fL (ref 80.0–100.0)
Monocytes Absolute: 0.2 10*3/uL (ref 0.1–1.0)
Monocytes Relative: 6 %
Neutro Abs: 2.8 10*3/uL (ref 1.7–7.7)
Neutrophils Relative %: 78 %
Platelet Count: 213 10*3/uL (ref 150–400)
RBC: 3.45 MIL/uL — ABNORMAL LOW (ref 3.87–5.11)
RDW: 13.5 % (ref 11.5–15.5)
WBC Count: 3.6 10*3/uL — ABNORMAL LOW (ref 4.0–10.5)
nRBC: 0 % (ref 0.0–0.2)

## 2022-01-12 LAB — CMP (CANCER CENTER ONLY)
ALT: 17 U/L (ref 0–44)
AST: 25 U/L (ref 15–41)
Albumin: 3.7 g/dL (ref 3.5–5.0)
Alkaline Phosphatase: 57 U/L (ref 38–126)
Anion gap: 8 (ref 5–15)
BUN: 19 mg/dL (ref 8–23)
CO2: 26 mmol/L (ref 22–32)
Calcium: 9.2 mg/dL (ref 8.9–10.3)
Chloride: 107 mmol/L (ref 98–111)
Creatinine: 1.29 mg/dL — ABNORMAL HIGH (ref 0.44–1.00)
GFR, Estimated: 45 mL/min — ABNORMAL LOW (ref >60)
Glucose, Bld: 153 mg/dL — ABNORMAL HIGH (ref 70–99)
Potassium: 3.7 mmol/L (ref 3.5–5.1)
Sodium: 141 mmol/L (ref 135–145)
Total Bilirubin: 0.2 mg/dL — ABNORMAL LOW (ref 0.3–1.2)
Total Protein: 7 g/dL (ref 6.5–8.1)

## 2022-01-12 MED ORDER — CYANOCOBALAMIN 1000 MCG/ML IJ SOLN
1000.0000 ug | Freq: Once | INTRAMUSCULAR | Status: AC
  Administered 2022-01-12: 1000 ug via INTRAMUSCULAR
  Filled 2022-01-12: qty 1

## 2022-01-12 MED ORDER — INTEGRA PLUS PO CAPS: 1.0000 | ORAL_CAPSULE | Freq: Every morning | ORAL | 2 refills | Status: DC

## 2022-01-12 MED ORDER — ONDANSETRON HCL 4 MG/2ML IJ SOLN
8.0000 mg | Freq: Once | INTRAMUSCULAR | Status: AC
  Administered 2022-01-12: 8 mg via INTRAVENOUS
  Filled 2022-01-12: qty 4

## 2022-01-12 MED ORDER — SODIUM CHLORIDE 0.9% FLUSH
10.0000 mL | Freq: Once | INTRAVENOUS | Status: AC
  Administered 2022-01-12: 10 mL

## 2022-01-12 MED ORDER — SODIUM CHLORIDE 0.9% FLUSH
10.0000 mL | INTRAVENOUS | Status: DC | PRN
  Administered 2022-01-12: 10 mL

## 2022-01-12 MED ORDER — HEPARIN SOD (PORK) LOCK FLUSH 100 UNIT/ML IV SOLN
500.0000 [IU] | Freq: Once | INTRAVENOUS | Status: AC | PRN
  Administered 2022-01-12: 500 [IU]

## 2022-01-12 MED ORDER — SODIUM CHLORIDE 0.9 % IV SOLN
400.0000 mg/m2 | Freq: Once | INTRAVENOUS | Status: AC
  Administered 2022-01-12: 600 mg via INTRAVENOUS
  Filled 2022-01-12: qty 20

## 2022-01-12 MED ORDER — SODIUM CHLORIDE 0.9 % IV SOLN: Freq: Once | INTRAVENOUS | Status: AC

## 2022-01-12 NOTE — Patient Instructions (Signed)
[facility]  Discharge Instructions: Thank you for choosing [facility] to provide your oncology and hematology care.   If you have a lab appointment with the [facility], please go directly to the [facility] and check in at the registration area.   Wear comfortable clothing and clothing appropriate for easy access to any Portacath or PICC line.   We strive to give you quality time with your provider. You may need to reschedule your appointment if you arrive late (15 or more minutes).  Arriving late affects you and other patients whose appointments are after yours.  Also, if you miss three or more appointments without notifying the office, you may be dismissed from the clinic at the provider's discretion.      For prescription refill requests, have your pharmacy contact our office and allow 72 hours for refills to be completed.    Today you received the following chemotherapy and/or immunotherapy agents: pemetrexed      To help prevent nausea and vomiting after your treatment, we encourage you to take your nausea medication as directed.  BELOW ARE SYMPTOMS THAT SHOULD BE REPORTED IMMEDIATELY: *FEVER GREATER THAN 100.4 F (38 C) OR HIGHER *CHILLS OR SWEATING *NAUSEA AND VOMITING THAT IS NOT CONTROLLED WITH YOUR NAUSEA MEDICATION *UNUSUAL SHORTNESS OF BREATH *UNUSUAL BRUISING OR BLEEDING *URINARY PROBLEMS (pain or burning when urinating, or frequent urination) *BOWEL PROBLEMS (unusual diarrhea, constipation, pain near the anus) TENDERNESS IN MOUTH AND THROAT WITH OR WITHOUT PRESENCE OF ULCERS (sore throat, sores in mouth, or a toothache) UNUSUAL RASH, SWELLING OR PAIN  UNUSUAL VAGINAL DISCHARGE OR ITCHING   Items with * indicate a potential emergency and should be followed up as soon as possible or go to the Emergency Department if any problems should occur.  Please show the CHEMOTHERAPY ALERT CARD or IMMUNOTHERAPY ALERT CARD at check-in to  the Emergency Department and triage nurse.  Should you have questions after your visit or need to cancel or reschedule your appointment, please contact [facility]  Dept: [phone]  and follow the prompts.  Office hours are 8:00 a.m. to 4:30 p.m. Monday - Friday. Please note that voicemails left after 4:00 p.m. may not be returned until the following business day.  We are closed weekends and major holidays. You have access to a nurse at all times for urgent questions. Please call the main number to the clinic Dept: [phone] and follow the prompts.   For any non-urgent questions, you may also contact your provider using MyChart. We now offer e-Visits for anyone 16 and older to request care online for non-urgent symptoms. For details visit [email or web address].   Also download the MyChart app! Go to the app store, search "MyChart", open the app, select [facility], and log in with your MyChart username and password.  Masks are optional in the cancer centers. If you would like for your care team to wear a mask while they are taking care of you, please let them know. For doctor visits, patients may have with them one support person who is at least 71 years old. At this time, visitors are not allowed in the infusion area.

## 2022-01-13 ENCOUNTER — Telehealth: Payer: Self-pay | Admitting: Pulmonary Disease

## 2022-01-13 MED ORDER — AMOXICILLIN-POT CLAVULANATE 875-125 MG PO TABS: 1.0000 | ORAL_TABLET | Freq: Two times a day (BID) | ORAL | 0 refills | Status: DC

## 2022-01-13 NOTE — Telephone Encounter (Signed)
Called patient but she did not answer. Left message for her to call back.  

## 2022-01-13 NOTE — Telephone Encounter (Signed)
Spoke with pt and reviewed Dr. Kavin Leech advise. Pt stated understanding and was scheduled for OV with Eric Form on 8/1 at 0900. Augmentin order was placed as well. Nothing further needed at this time.

## 2022-01-13 NOTE — Telephone Encounter (Signed)
Called and spoke with patient. She stated that she has been more SOB and coughing over the past week. Her cough is sometimes productive with thick tan phlegm. She is normally able to use the Duoneb to help with the SOB and cough but it is not working this time. She has also stated that her chest has been extremely tight. Denied any fever or body aches. She does have some nausea but she believes this is coming from the cancer medications she is on.   I attempted to make an appointment for her but we do not have anything for today.   Pharmacy is [facility] on [address].   Dr. Silas Flood, can you please advise since Dr. Valeta Harms is not available today? Thanks!

## 2022-01-13 NOTE — Telephone Encounter (Signed)
Concern for bronchiectasis exacerbation. Please send Augmentin 875-125 BID x 14 days. Use Duoneb QID until symptoms start to improve. If not better early next week may need to consider prednisone. See if can gt visit for her next week to check on how she is doing. Thanks!

## 2022-01-15 ENCOUNTER — Encounter: Payer: Self-pay | Admitting: Physician Assistant

## 2022-01-15 ENCOUNTER — Encounter: Payer: Self-pay | Admitting: Internal Medicine

## 2022-01-17 ENCOUNTER — Telehealth: Payer: Self-pay | Admitting: Acute Care

## 2022-01-17 ENCOUNTER — Ambulatory Visit (INDEPENDENT_AMBULATORY_CARE_PROVIDER_SITE_OTHER): Payer: Medicare HMO

## 2022-01-17 ENCOUNTER — Ambulatory Visit: Payer: Medicare HMO | Admitting: Acute Care

## 2022-01-17 ENCOUNTER — Encounter: Payer: Self-pay | Admitting: Acute Care

## 2022-01-17 VITALS — BP 112/78 | HR 84 | Temp 98.2°F | Ht 64.0 in | Wt 117.0 lb

## 2022-01-17 DIAGNOSIS — R0789 Other chest pain: Secondary | ICD-10-CM

## 2022-01-17 DIAGNOSIS — J479 Bronchiectasis, uncomplicated: Secondary | ICD-10-CM | POA: Diagnosis not present

## 2022-01-17 DIAGNOSIS — C3491 Malignant neoplasm of unspecified part of right bronchus or lung: Secondary | ICD-10-CM | POA: Diagnosis not present

## 2022-01-17 MED ORDER — PREDNISONE 10 MG PO TABS: ORAL_TABLET | ORAL | 0 refills | Status: DC

## 2022-01-17 NOTE — Telephone Encounter (Signed)
I have called the patient with the results of her CXR. It is stable since her last imaging on 11/07/2021. She verbalized understanding. Plan is per the OV note dated 01/17/2022.

## 2022-01-17 NOTE — Patient Instructions (Addendum)
It is good to see you today.  Continue Duoneb neb treatments.  We will do a CXR today. I will call you with the result We will do an EKG today in the office We will do a prednisone taper  Prednisone taper; 10 mg tablets: 4 tabs x 2 days, 3 tabs x 2 days, 2 tabs x 2 days 1 tab x 2 days then stop.  Take in the morning, eat breakfast first.  Finish the Augmentin Dr. Silas Flood sent in for you.  We will add Mucinex 1200 mg ( liquid of tablets) Use Flutter valve or chest vest to help mobilize the mucus so you can cough it up.  We will send sputum  for culture, AFB and fungus. Collect in the morning and and bring to the office within 4 hours of collection. Follow up visit with Judson Roch NP or Dr. Valeta Harms in 2 weeks to ensure you are responding to treatment Call if you need [place] sooner  Please contact office for sooner follow up if symptoms do not improve or worsen or seek emergency care

## 2022-01-17 NOTE — Progress Notes (Signed)
History of Present Illness Allison [name] is a 71 y.o. female former smoker with stage 4 adenocarcinoma of the right lung, (12/2017), on chemo, hypertension, CVA, CAD, DVT/PE (on Lovenox),iron deficiency anemia,  and GERD. She is followed by Dr. Valeta Harms.   01/17/2022 Pt. Presents for follow up. She called the office 01/13/2022 with worsening chest tightness and dyspnea with exertion and cough with  productive with thick tan phlegm. She is normally able to use the Duoneb to help with the SOB and cough but it is not working this time. She has also stated that her chest has been extremely tight. Denied any fever or body aches. She does have some nausea but she believes this is coming from the cancer medications she is on. She was phoned in prescription for Augmentin  as there was concern for flare of her bronchiectasis. She was also told to Use Duoneb QID until symptoms start to improve.  She is no better and she is here today to be evaluated in person in the clinic.  Pt. States since she started on the antibiotic she does feel a bit better. She states she coughs up thick tan colored hard balls of mucus, more in the mornings. She has been using the Duonebs 4 times daily. She states using the nebulizer is very difficult. Using it 4 times daily is wearing her out. She is very deconditioned , very weak. She denies fever that she is aware of .  She states she is using  her chest vest only on her back. But it does help to clear her lungs. She did have some left jaw pain  yesterday. She is extremely fatigued today ion the office. She states is sleeping ok and that her cough is not keeping her up at night. . Lungs are clear. She denies any fever.  Test Results:  CXR 01/17/2022 Unchanged consolidation and fibrosis of the right pulmonary apex. Unchanged small right pleural effusion.  2.  The left lung is normally aerated.  EKG 01/17/2022   Sinus Rhythm within normal limits      Latest Ref Rng & Units 01/12/2022    10:03 AM 12/22/2021   12:47 PM 12/01/2021    9:47 AM  CBC  WBC 4.0 - 10.5 K/uL 3.6  3.7  5.1   Hemoglobin 12.0 - 15.0 g/dL 9.6  10.0  9.8   Hematocrit 36.0 - 46.0 % 30.0  30.7  30.4   Platelets 150 - 400 K/uL 213  241  213        Latest Ref Rng & Units 01/12/2022   10:03 AM 12/22/2021   12:47 PM 12/01/2021    9:47 AM  BMP  Glucose 70 - 99 mg/dL 153  122  126   BUN 8 - 23 mg/dL 19  22  17    Creatinine 0.44 - 1.00 mg/dL 1.29  1.35  1.18   Sodium 135 - 145 mmol/L 141  140  139   Potassium 3.5 - 5.1 mmol/L 3.7  3.3  3.2   Chloride 98 - 111 mmol/L 107  104  105   CO2 22 - 32 mmol/L 26  29  27    Calcium 8.9 - 10.3 mg/dL 9.2  9.4  9.7     BNP    Component Value Date/Time   BNP 35.4 02/07/2021 1640    ProBNP No results found for: "PROBNP"  PFT No results found for: "FEV1PRE", "FEV1POST", "FVCPRE", "FVCPOST", "TLC", "DLCOUNC", "PREFEV1FVCRT", "PSTFEV1FVCRT"  CT CHEST ABDOMEN PELVIS W  CONTRAST  Result Date: 01/10/2022 CLINICAL DATA:  Status post radiation therapy for lung cancer. On chemotherapy. Chest tube. Hysterectomy. Small-cell primary. * Tracking Code: BO * EXAM: CT CHEST, ABDOMEN, AND PELVIS WITH CONTRAST TECHNIQUE: Multidetector CT imaging of the chest, abdomen and pelvis was performed following the standard protocol during bolus administration of intravenous contrast. RADIATION DOSE REDUCTION: This exam was performed according to the departmental dose-optimization program which includes automated exposure control, adjustment of the mA and/or kV according to patient size and/or use of iterative reconstruction technique. CONTRAST:  31mL OMNIPAQUE IOHEXOL 300 MG/ML  SOLN COMPARISON:  11/07/2021 chest CT.  09/06/2021 abdominopelvic CT. FINDINGS: CT CHEST FINDINGS Cardiovascular: Right Port-A-Cath terminates at mid right atrium. Aortic atherosclerosis. Normal heart size, without pericardial effusion. Right coronary artery calcification. No central pulmonary embolism, on this  non-dedicated study. Mediastinum/Nodes: No supraclavicular adenopathy. Presumably radiation induced mediastinal edema again identified, without well-defined adenopathy. No hilar adenopathy. Lungs/Pleura: Trace right-sided pleural fluid is similar. The right apical lung nodule is again difficult to delineate from surrounding radiation induced consolidation. Measures on the order of 1.6 x 1.4 cm on 28/4 versus 2.0 x 1.6 cm on the prior exam. Smooth septal thickening within the right lung base is unchanged, favored to be radiation induced. Similar configuration of right apical, medial right upper lobe, and superior segment right lower lobe geographic consolidation with air bronchograms. 3 mm left lower lobe calcified granuloma. Musculoskeletal: No acute osseous abnormality. CT ABDOMEN PELVIS FINDINGS Hepatobiliary: Normal liver. Tiny dependent gallstone. No acute cholecystitis or biliary duct dilatation. Pancreas: Normal, without mass or ductal dilatation. Spleen: Normal in size, without focal abnormality. Adrenals/Urinary Tract: Normal adrenal glands. Lower pole right renal scarring. Right renal too small to characterize lesions. Normal left kidney. No hydronephrosis. Normal urinary bladder. Stomach/Bowel: Normal stomach, without wall thickening. Normal colon and terminal ileum. Normal small bowel. Vascular/Lymphatic: Aortic atherosclerosis. No abdominopelvic adenopathy. Reproductive: Hysterectomy.  No adnexal mass. Other: No significant free fluid. Mild pelvic floor laxity. No evidence of omental or peritoneal disease. Musculoskeletal: Presumably iatrogenic subcutaneous nodularity and gas about the anterior abdominopelvic wall. The L2 left pedicle sclerotic lesion is less distinct today. Example 57/2. Trace L5-S1 anterolisthesis is similar. Transitional S1 vertebral body. IMPRESSION: 1. Similar appearance of radiation change throughout the right upper lobe and superior segment right lower lobe. The right apical lung  nodule is difficult to differentiate from surrounding consolidation, but felt to be slightly decreased in size compared to 11/07/2021. 2. Similar small right pleural effusion. 3. No findings of soft tissue metastatic disease within the chest, abdomen, or pelvis. 4. Previously described left L2 pedicle lesion is less distinct today, likely representing response to therapy of osseous metastasis. 5. Aortic atherosclerosis (ICD10-I70.0), coronary artery atherosclerosis and emphysema (ICD10-J43.9). 6. Cholelithiasis Electronically Signed   By: Abigail Miyamoto M.D.   On: 01/10/2022 17:59     Past medical hx Past Medical History:  Diagnosis Date   Anemia    Anxiety    Arthritis    Asthma    exercise induced   Depression    PMH   Dyspnea    GERD (gastroesophageal reflux disease)    Glaucoma    History of radiation therapy 01/05/2021   IMRT right lung  11/24/2020-01/05/2021  Dr [name]   Hypertension    lung ca dx'd 11/2017   right   Malignant pleural effusion    right   Nuclear sclerotic cataract of right eye 02/28/2021   Dr. Warden Fillers, cataract surgery February 2023  PONV (postoperative nausea and vomiting)    Pre-diabetes    Raynaud's disease    Raynaud's disease    Stroke ([place]) 01/2021   balance off, some express aphasia, weakness     Social History   Tobacco Use   Smoking status: Never   Smokeless tobacco: Never  Vaping Use   Vaping Use: Never used  Substance Use Topics   Alcohol use: Not Currently    Comment: up to 3 drinks per week   Drug use: No    Comment: CBD oil     Ms.Tate reports that she has never smoked. She has never used smokeless tobacco. She reports that she does not currently use alcohol. She reports that she does not use drugs.  Tobacco Cessation: Never smoker   Past surgical hx, Family hx, Social hx all reviewed.  Current Outpatient Medications on File Prior to Visit  Medication Sig   acetaminophen (TYLENOL) 500 MG tablet Take 1,000 mg by  mouth every 6 (six) hours as needed (pain).   ALPRAZolam (XANAX) 0.25 MG tablet Take 1 tablet (0.25 mg total) by mouth 3 (three) times daily as needed for anxiety. May take 2 tablets (0.5 mg total) at bedtime.   amoxicillin-clavulanate (AUGMENTIN) 875-125 MG tablet Take 1 tablet by mouth 2 (two) times daily.   aspirin 81 MG chewable tablet Chew 1 tablet (81 mg total) by mouth daily.   brompheniramine-pseudoephedrine-DM 30-2-10 MG/5ML syrup Take 5 mLs by mouth 3 (three) times daily as needed.   carvedilol (COREG) 12.5 MG tablet TAKE 1 TABLET BY MOUTH 2 TIMES DAILY.   cetirizine (ZYRTEC) 5 MG tablet Take 5 mg by mouth daily.   dexamethasone (DECADRON) 4 MG tablet Take 1 tablet twice a day the day before, the day of, and the day after chemotherapy   dorzolamide-timolol (COSOPT) 22.3-6.8 MG/ML ophthalmic solution Place 1 drop into both eyes 2 (two) times daily.   dronabinol (MARINOL) 2.5 MG capsule Take 1 capsule (2.5 mg total) by mouth 2 (two) times daily as needed (decreased appetite).   enoxaparin (LOVENOX) 60 MG/0.6ML injection Inject 0.6 mLs (60 mg total) into the skin every 12 (twelve) hours. (Patient taking differently: Inject 80 mg into the skin daily. 80 mg)   esomeprazole (NEXIUM) 20 MG capsule Take 1 capsule (20 mg total) by mouth in the morning and at bedtime.   FLUoxetine (PROZAC) 20 MG capsule Take 1 capsule (20 mg total) by mouth daily.   folic acid (FOLVITE) 1 MG tablet TAKE 1 TABLET BY MOUTH EVERY DAY   hyoscyamine (LEVSIN SL) 0.125 MG SL tablet Place 2 tablets (0.25 mg total) under the tongue every 6 (six) hours as needed for cramping. Take 1-2 tablets under the tongue every 6 hours as needed for cramping   ipratropium-albuterol (DUONEB) 0.5-2.5 (3) MG/3ML SOLN Take 3 mLs by nebulization every 6 (six) hours as needed (Asthma).   isosorbide mononitrate (IMDUR) 30 MG 24 hr tablet Take 30 mg by mouth daily.   latanoprost (XALATAN) 0.005 % ophthalmic solution Place 1 drop into both eyes at  bedtime.    ondansetron (ZOFRAN-ODT) 4 MG disintegrating tablet Take 1 tablet (4 mg total) by mouth every 8 (eight) hours as needed for nausea or vomiting.   osimertinib mesylate (TAGRISSO) 80 MG tablet Take 1 tablet (80 mg total) by mouth daily.   potassium chloride SA (KLOR-CON M) 20 MEQ tablet Take 1 tablet (20 mEq total) by mouth daily.   REPATHA SURECLICK 235 MG/ML SOAJ INJECT 140 MG INTO THE  SKIN EVERY 14 (FOURTEEN) DAYS.   FeFum-FePoly-FA-B Cmp-C-Biot (INTEGRA PLUS) CAPS Take 1 capsule by mouth every morning. (Patient not taking: Reported on 01/17/2022)   No current facility-administered medications on file prior to visit.     Allergies  Allergen Reactions   Penicillins Other (See Comments)    SYNCOPE PATIENT HAS HAD A PCN REACTION WITH IMMEDIATE RASH, FACIAL/TONGUE/THROAT SWELLING, SOB, OR LIGHTHEADEDNESS WITH HYPOTENSION:  #  #  YES  #  # Has patient had a PCN reaction causing severe rash involving mucus membranes or skin necrosis: No Has patient had a PCN reaction that required hospitalization: No Has patient had a PCN reaction occurring within the last 10 years: No    Vicodin [Hydrocodone-Acetaminophen] Other (See Comments)    Sped up heart and breathing   Other Other (See Comments)    Glaucoma eye drop - turned eyes dark    Review Of Systems:  Constitutional:   No  weight loss, night sweats,  Fevers, chills, ++fatigue, or  lassitude.  HEENT:   No headaches,  Difficulty swallowing,  Tooth/dental problems, or  Sore throat,                No sneezing, itching, ear ache, nasal congestion, post nasal drip,   CV:  + chest pain,  No Orthopnea, PND, swelling in lower extremities, anasarca, dizziness, palpitations, syncope. Porta Cath  GI  No heartburn, indigestion, abdominal pain, nausea, vomiting, diarrhea, change in bowel habits, + loss of appetite, bloody stools.   Resp: + shortness of breath with exertion or at rest.  + excess mucus, + productive cough,  No non-productive  cough,  No coughing up of blood.  + change in color of mucus.  No wheezing.  No chest wall deformity  Skin: no rash or lesions.  GU: no dysuria, change in color of urine, no urgency or frequency.  No flank pain, no hematuria   MS:  No joint pain or swelling.  + decreased range of motion.  No back pain.  Psych:  No change in mood or affect. No depression or anxiety.  No memory loss.   Vital Signs BP 112/78 (BP Location: Left Arm, Patient Position: Sitting, Cuff Size: Normal)   Pulse 84   Temp 98.2 F (36.8 C) (Oral)   Ht 5\' 4"  (1.626 m)   Wt 117 lb (53.1 kg)   LMP  (LMP Unknown)   SpO2 98%   BMI 20.08 kg/m    Physical Exam:  General- No distress,  A&Ox3, pleasant ENT: No sinus tenderness, TM clear, pale nasal mucosa, no oral exudate,no post nasal drip, no LAN Cardiac: S1, S2, regular rate and rhythm, no murmur Chest: No wheeze/ rales/ dullness; no accessory muscle use, no nasal flaring, no sternal retractions, diminished per bases Abd.: Soft Non-tender, ND, BS +, Body mass index is 20.08 kg/m.  Ext: No clubbing cyanosis, edema Neuro:  normal strength, MAE x 4, A&O x 3 Skin: No rashes, warm and dry, no lesions  Psych: normal mood and behavior   Assessment/Plan Bronchiectasis Flare  Chest tightness Plan Continue Duoneb neb treatments.  We will do a CXR today.>> Stable CXR I will call you with the result We will do an EKG today in the office>> NSR We will do a prednisone taper  Prednisone taper; 10 mg tablets: 4 tabs x 2 days, 3 tabs x 2 days, 2 tabs x 2 days 1 tab x 2 days then stop.  Take in the morning, eat breakfast first.  Finish the Augmentin Dr. Silas Flood sent in for you.  We will add Mucinex 1200 mg ( liquid of tablets) Use Flutter valve or chest vest to help mobilize the mucus so you can cough it up.  We will send sputum  for culture, AFB and fungus. Collect in the morning and and bring to the office within 4 hours of collection. Follow up visit with Judson Roch NP  or Dr. Valeta Harms in 2 weeks to ensure you are responding to treatment Call if you need [place] sooner  Please contact office for sooner follow up if symptoms do not improve or worsen or seek emergency care    Stage IV Adenocarcinoma of the lung Plan Per oncology  I spent 45 minutes dedicated to the care of this patient on the date of this encounter to include pre-visit review of records, face-to-face time with the patient discussing conditions above, post visit ordering of testing, clinical documentation with the electronic health record, making appropriate referrals as documented, and communicating necessary information to the patient's healthcare team.   Magdalen Spatz, NP 01/17/2022  8:59 AM

## 2022-01-18 DIAGNOSIS — E663 Overweight: Secondary | ICD-10-CM | POA: Insufficient documentation

## 2022-01-18 DIAGNOSIS — R7309 Other abnormal glucose: Secondary | ICD-10-CM | POA: Insufficient documentation

## 2022-01-27 NOTE — Progress Notes (Unsigned)
[facility] HEMATOLOGY-ONCOLOGY TeleHEALTH VISIT PROGRESS NOTE   I connected with Allison [name] on 01/27/22 at  8:30 AM EDT by telephone and verified that I am speaking with the correct person using two identifiers.  I discussed the limitations, risks, security and privacy concerns of performing an evaluation and management service by telemedicine and the availability of in-person appointments. I also discussed with the patient that there may be a patient responsible charge related to this service. The patient expressed understanding and agreed to proceed.  Other persons participating in the visit and their role in the encounter: None  Patient's location: Home  Provider's location: [place], [place], [place] [address]  DIAGNOSIS: Stage IV (T2 a,N2, M1a) non-small cell lung cancer, adenocarcinoma diagnosed in July 2019 and presented with right upper lobe lung mass in addition to mediastinal lymphadenopathy as well as bilateral pulmonary nodules and malignant right pleural effusion.   Biomarker Findings Microsatellite status - Cannot Be Determined Tumor Mutational Burden - Cannot Be Determined Genomic Findings For a complete list of the genes assayed, please refer to the Appendix. EGFR exon 19 deletion ([id number]>H) TP53 Y220C 7 Disease relevant genes with no reportable alterations: KRAS, ALK, BRAF, MET, RET, ERBB2, ROS1   PRIOR THERAPY: 1) Status post right Pleurx catheter placement by Dr. Prescott Gum for drainage of malignant right pleural effusion. 2) palliative radiotherapy to the enlarging right upper lobe lung mass and mediastinum under the care of Dr. Sondra Come expected to be completed on January 05, 2021. 3) Tagrisso 80 mg p.o. daily.  First dose was given on 01/29/2018.  Status post 39 months of treatment.  CURRENT THERAPY: Systemic chemotherapy with carboplatin for AUC of 5 and Alimta 500 Mg/M2 every 3 weeks.  First dose  03/17/2021.  The patient will also continue her current treatment with Tagrisso 80 mg p.o. daily.  She is status post 15 cycles. Starting from cycle #7, she will be on Alimta only 500 mg/m2.  Alimta was eventually reduced to 400 mg per metered squared due to renal insufficiency  INTERVAL HISTORY: Allison [name] 71 y.o. female and I connected via a telephone encounter today.The patient is currently undergoing maintenance treatment with chemotherapy with Alimta.  She is on a reduced dose of Alimta due to some kidney dysfunction.  Since being seen, she has been seeing pulmonology for possible bronchiectasis exacerbation for which she completed course of antibiotics, inhalers, and a prednisone taper. She then followed up with them on 01/31/22 and reports her cough is a lot better. She no longer has the tightness on the chest. She is also taking mucinex in the AM. The other thing that she mentioned is that she started taking her coreg once a day due to borderline low BP at the pulmonology office the other day and she was having associated lightheadedness. She has felt better on the reduced dose of coreg.   Otherwise since last being seen, she denies any changes in her health.  She denies any fever or chills. She has some stable baseline night sweats. She is trying to increase her snacking and eating small frequent meals.  She is no longer taking Ensure or Marinol. She denies any hemoptysis or chest pain.  She denies any nausea, vomiting, unusual constipation, or diarrhea. She denies any headache or visual changes.  She is here today for evaluation and repeat blood work before starting cycle #16.   MEDICAL HISTORY: Past Medical History:  Diagnosis Date  Anemia    Anxiety    Arthritis    Asthma    exercise induced   Depression    PMH   Dyspnea    GERD (gastroesophageal reflux disease)    Glaucoma    History of radiation therapy 01/05/2021   IMRT right lung  11/24/2020-01/05/2021  Dr [name]    Hypertension    lung ca dx'd 11/2017   right   Malignant pleural effusion    right   Nuclear sclerotic cataract of right eye 02/28/2021   Dr. Warden Fillers, cataract surgery February 2023   PONV (postoperative nausea and vomiting)    Pre-diabetes    Raynaud's disease    Raynaud's disease    Stroke ([facility]) 01/2021   balance off, some express aphasia, weakness    ALLERGIES:  is allergic to hydrocodone-acetaminophen, penicillins, vicodin [hydrocodone-acetaminophen], and other.  MEDICATIONS:  Current Outpatient Medications  Medication Sig Dispense Refill   acetaminophen (TYLENOL) 500 MG tablet Take 1,000 mg by mouth every 6 (six) hours as needed (pain).     ALPRAZolam (XANAX) 0.25 MG tablet Take 1 tablet (0.25 mg total) by mouth 3 (three) times daily as needed for anxiety. May take 2 tablets (0.5 mg total) at bedtime. 60 tablet 0   amoxicillin-clavulanate (AUGMENTIN) 875-125 MG tablet Take 1 tablet by mouth 2 (two) times daily. 14 tablet 0   aspirin 81 MG chewable tablet Chew 1 tablet (81 mg total) by mouth daily.     brompheniramine-pseudoephedrine-DM 30-2-10 MG/5ML syrup Take 5 mLs by mouth 3 (three) times daily as needed. 120 mL 0   carvedilol (COREG) 12.5 MG tablet TAKE 1 TABLET BY MOUTH 2 TIMES DAILY. 180 tablet 1   cetirizine (ZYRTEC) 5 MG tablet Take 5 mg by mouth daily.     dexamethasone (DECADRON) 4 MG tablet Take 1 tablet twice a day the day before, the day of, and the day after chemotherapy 40 tablet 2   dorzolamide-timolol (COSOPT) 22.3-6.8 MG/ML ophthalmic solution Place 1 drop into both eyes 2 (two) times daily.     dronabinol (MARINOL) 2.5 MG capsule Take 1 capsule (2.5 mg total) by mouth 2 (two) times daily as needed (decreased appetite). 60 capsule 0   enoxaparin (LOVENOX) 60 MG/0.6ML injection Inject 0.6 mLs (60 mg total) into the skin every 12 (twelve) hours. (Patient taking differently: Inject 80 mg into the skin daily. 80 mg) 60 mL 1   esomeprazole (NEXIUM) 20 MG  capsule Take 1 capsule (20 mg total) by mouth in the morning and at bedtime. 60 capsule 2   FeFum-FePoly-FA-B Cmp-C-Biot (INTEGRA PLUS) CAPS Take 1 capsule by mouth every morning. (Patient not taking: Reported on 01/17/2022) 30 capsule 2   FLUoxetine (PROZAC) 20 MG capsule Take 1 capsule (20 mg total) by mouth daily. 90 capsule 3   folic acid (FOLVITE) 1 MG tablet TAKE 1 TABLET BY MOUTH EVERY DAY 90 tablet 1   hyoscyamine (LEVSIN SL) 0.125 MG SL tablet Place 2 tablets (0.25 mg total) under the tongue every 6 (six) hours as needed for cramping. Take 1-2 tablets under the tongue every 6 hours as needed for cramping 30 tablet 0   ipratropium-albuterol (DUONEB) 0.5-2.5 (3) MG/3ML SOLN Take 3 mLs by nebulization every 6 (six) hours as needed (Asthma).     isosorbide mononitrate (IMDUR) 30 MG 24 hr tablet Take 30 mg by mouth daily.     latanoprost (XALATAN) 0.005 % ophthalmic solution Place 1 drop into both eyes at bedtime.  ondansetron (ZOFRAN-ODT) 4 MG disintegrating tablet Take 1 tablet (4 mg total) by mouth every 8 (eight) hours as needed for nausea or vomiting. 30 tablet 1   osimertinib mesylate (TAGRISSO) 80 MG tablet Take 1 tablet (80 mg total) by mouth daily. 30 tablet 3   potassium chloride SA (KLOR-CON M) 20 MEQ tablet Take 1 tablet (20 mEq total) by mouth daily. 5 tablet 0   predniSONE (DELTASONE) 10 MG tablet Prednisone taper; 10 mg tablets: 4 tabs x 2 days, 3 tabs x 2 days, 2 tabs x 2 days 1 tab x 2 days then stop. 20 tablet 0   REPATHA SURECLICK 673 MG/ML SOAJ INJECT 140 MG INTO THE SKIN EVERY 14 (FOURTEEN) DAYS. 6 mL 3   No current facility-administered medications for this visit.    SURGICAL HISTORY:  Past Surgical History:  Procedure Laterality Date   ABDOMINAL HYSTERECTOMY     partial   BRONCHIAL BIOPSY  04/21/2021   Procedure: BRONCHIAL BIOPSIES;  Surgeon: [name], DO;  Location: [facility];  Service: Pulmonary;;   BRONCHIAL BRUSHINGS  04/21/2021   Procedure:  BRONCHIAL BRUSHINGS;  Surgeon: [name], DO;  Location: [facility];  Service: Pulmonary;;   BRONCHIAL NEEDLE ASPIRATION BIOPSY  04/21/2021   Procedure: BRONCHIAL NEEDLE ASPIRATION BIOPSIES;  Surgeon: [name], DO;  Location: [facility];  Service: Pulmonary;;   CHEST TUBE INSERTION Right 01/01/2018   Procedure: INSERTION PLEURAL DRAINAGE CATHETER;  Surgeon: Ivin Poot, MD;  Location: [facility] OR;  Service: Thoracic;  Laterality: Right;   CHEST TUBE INSERTION  04/21/2021   Procedure: CHEST TUBE INSERTION;  Surgeon: [name], DO;  Location: [facility];  Service: Pulmonary;;   COLONOSCOPY     CORONARY STENT INTERVENTION N/A 06/16/2019   Procedure: CORONARY STENT INTERVENTION;  Surgeon: Jettie Booze, MD;  Location: [facility];  Service: Cardiovascular;  Laterality: N/A;   DILATION AND CURETTAGE OF UTERUS     EYE SURGERY     due to Glaucoma   IR IMAGING GUIDED PORT INSERTION  03/22/2021   IR PORT REPAIR CENTRAL VENOUS ACCESS DEVICE  04/15/2021   LEFT HEART CATH AND CORONARY ANGIOGRAPHY N/A 06/16/2019   Procedure: LEFT HEART CATH AND CORONARY ANGIOGRAPHY;  Surgeon: Jettie Booze, MD;  Location: [facility];  Service: Cardiovascular;  Laterality: N/A;   REMOVAL OF PLEURAL DRAINAGE CATHETER Right 11/07/2018   Procedure: REMOVAL OF PLEURAL DRAINAGE CATHETER;  Surgeon: Ivin Poot, MD;  Location: [facility];  Service: Thoracic;  Laterality: Right;   ROTATOR CUFF REPAIR     TUBAL LIGATION     VIDEO BRONCHOSCOPY WITH ENDOBRONCHIAL NAVIGATION Bilateral 04/21/2021   Procedure: VIDEO BRONCHOSCOPY WITH ENDOBRONCHIAL NAVIGATION;  Surgeon: [name], DO;  Location: [facility];  Service: Pulmonary;  Laterality: Bilateral;  ION   WISDOM TOOTH EXTRACTION      REVIEW OF SYSTEMS:   Constitutional: Positive for fatigue with overactivity.  Negative for appetite change, chills,fever and unexpected weight change.  HENT:   Negative for mouth sores,  nosebleeds, sore throat and trouble swallowing.   Eyes: Negative for eye problems and icterus.  Respiratory: Positive for stable dyspnea with overactivity.  Positive for improving cough.  Negative for hemoptysis and wheezing.   Cardiovascular: Negative for chest pain and leg swelling.  Gastrointestinal: Negative for abdominal pain, constipation, diarrhea, nausea and vomiting.  Genitourinary: Negative for bladder incontinence, difficulty urinating, dysuria, frequency and hematuria.   Musculoskeletal: Negative for back pain, gait problem,  neck pain and neck stiffness.  Skin: Negative for itching and rash.  Neurological: Negative for dizziness, extremity weakness, gait problem, headaches, light-headedness (improved since reducing coreg) and seizures.  Hematological: Negative for adenopathy. Does not bruise/bleed easily.  Psychiatric/Behavioral: Negative for confusion, depression and sleep disturbance. The patient is not nervous/anxious.     PHYSICAL EXAMINATION:  There were no vitals taken for this visit.  ECOG PERFORMANCE STATUS: 1  Physical Exam  Constitutional: Oriented to person, place, and time and well-developed, well-nourished, and in no distress.   HENT:  Head: Normocephalic and atraumatic.  Psychiatric: Mood, memory and judgment normal.  Vitals reviewed.  LABORATORY DATA: Lab Results  Component Value Date   WBC 3.6 (L) 01/12/2022   HGB 9.6 (L) 01/12/2022   HCT 30.0 (L) 01/12/2022   MCV 87.0 01/12/2022   PLT 213 01/12/2022      Chemistry      Component Value Date/Time   NA 141 01/12/2022 1003   NA 140 03/04/2021 1516   K 3.7 01/12/2022 1003   CL 107 01/12/2022 1003   CO2 26 01/12/2022 1003   BUN 19 01/12/2022 1003   BUN 21 03/04/2021 1516   CREATININE 1.29 (H) 01/12/2022 1003      Component Value Date/Time   CALCIUM 9.2 01/12/2022 1003   ALKPHOS 57 01/12/2022 1003   AST 25 01/12/2022 1003   ALT 17 01/12/2022 1003   BILITOT 0.2 (L) 01/12/2022 1003        RADIOGRAPHIC STUDIES:  DG Chest 2 View  Result Date: 01/17/2022 CLINICAL DATA:  Bronchiectasis EXAM: CHEST - 2 VIEW COMPARISON:  Chest radiographs, 04/26/2021, CT chest, 01/10/2022 FINDINGS: Right chest port catheter. Unchanged consolidation and fibrosis of the right pulmonary apex. Unchanged small right pleural effusion. The left lung is normally aerated. Heart and mediastinum are normal. Osseous structures unremarkable. IMPRESSION: 1. Unchanged consolidation and fibrosis of the right pulmonary apex. Unchanged small right pleural effusion. 2.  The left lung is normally aerated. Electronically Signed   By: Delanna Ahmadi M.D.   On: 01/17/2022 10:05   CT CHEST ABDOMEN PELVIS W CONTRAST  Result Date: 01/10/2022 CLINICAL DATA:  Status post radiation therapy for lung cancer. On chemotherapy. Chest tube. Hysterectomy. Small-cell primary. * Tracking Code: BO * EXAM: CT CHEST, ABDOMEN, AND PELVIS WITH CONTRAST TECHNIQUE: Multidetector CT imaging of the chest, abdomen and pelvis was performed following the standard protocol during bolus administration of intravenous contrast. RADIATION DOSE REDUCTION: This exam was performed according to the departmental dose-optimization program which includes automated exposure control, adjustment of the mA and/or kV according to patient size and/or use of iterative reconstruction technique. CONTRAST:  20m OMNIPAQUE IOHEXOL 300 MG/ML  SOLN COMPARISON:  11/07/2021 chest CT.  09/06/2021 abdominopelvic CT. FINDINGS: CT CHEST FINDINGS Cardiovascular: Right Port-A-Cath terminates at mid right atrium. Aortic atherosclerosis. Normal heart size, without pericardial effusion. Right coronary artery calcification. No central pulmonary embolism, on this non-dedicated study. Mediastinum/Nodes: No supraclavicular adenopathy. Presumably radiation induced mediastinal edema again identified, without well-defined adenopathy. No hilar adenopathy. Lungs/Pleura: Trace right-sided pleural fluid  is similar. The right apical lung nodule is again difficult to delineate from surrounding radiation induced consolidation. Measures on the order of 1.6 x 1.4 cm on 28/4 versus 2.0 x 1.6 cm on the prior exam. Smooth septal thickening within the right lung base is unchanged, favored to be radiation induced. Similar configuration of right apical, medial right upper lobe, and superior segment right lower lobe geographic consolidation with air bronchograms. 3 mm left lower  lobe calcified granuloma. Musculoskeletal: No acute osseous abnormality. CT ABDOMEN PELVIS FINDINGS Hepatobiliary: Normal liver. Tiny dependent gallstone. No acute cholecystitis or biliary duct dilatation. Pancreas: Normal, without mass or ductal dilatation. Spleen: Normal in size, without focal abnormality. Adrenals/Urinary Tract: Normal adrenal glands. Lower pole right renal scarring. Right renal too small to characterize lesions. Normal left kidney. No hydronephrosis. Normal urinary bladder. Stomach/Bowel: Normal stomach, without wall thickening. Normal colon and terminal ileum. Normal small bowel. Vascular/Lymphatic: Aortic atherosclerosis. No abdominopelvic adenopathy. Reproductive: Hysterectomy.  No adnexal mass. Other: No significant free fluid. Mild pelvic floor laxity. No evidence of omental or peritoneal disease. Musculoskeletal: Presumably iatrogenic subcutaneous nodularity and gas about the anterior abdominopelvic wall. The L2 left pedicle sclerotic lesion is less distinct today. Example 57/2. Trace L5-S1 anterolisthesis is similar. Transitional S1 vertebral body. IMPRESSION: 1. Similar appearance of radiation change throughout the right upper lobe and superior segment right lower lobe. The right apical lung nodule is difficult to differentiate from surrounding consolidation, but felt to be slightly decreased in size compared to 11/07/2021. 2. Similar small right pleural effusion. 3. No findings of soft tissue metastatic disease within the  chest, abdomen, or pelvis. 4. Previously described left L2 pedicle lesion is less distinct today, likely representing response to therapy of osseous metastasis. 5. Aortic atherosclerosis (ICD10-I70.0), coronary artery atherosclerosis and emphysema (ICD10-J43.9). 6. Cholelithiasis Electronically Signed   By: Abigail Miyamoto M.D.   On: 01/10/2022 17:59     ASSESSMENT/PLAN:  This is a very pleasant 71 year old never smoker African-American female diagnosed with stage IV non-small cell lung cancer, adenocarcinoma.  She was positive for an EGFR mutation with deletion in exon 19.  She was diagnosed in July 2019 and presented with right upper lobe lung mass in addition to mediastinal lymphadenopathy as well as bilateral pulmonary nodules and malignant right pleural effusion.   The patient was started on treatment with Tagrisso 80 mg p.o. daily status post 39 months of treatment. Started on 01/29/2018.   In April 2022, she showed evidence of disease progression with interval progression of the right apical lung mass in addition to progression of mediastinal lymphadenopathy concerning for worsening of her disease. She had repeat Guardant 360 molecular studies which did not show evidence of new resistant mutations.  Therefore, she was referred to radiation oncology and completed palliative radiotherapy to the enlarging right upper lobe lung mass and mediastinum under the care of Dr. Sondra Come.  This was completed in July 2022.  Unfortunately, the patient recently was found to have evidence of disease progression.  Therefore, Dr. Julien Nordmann started the patient on systemic chemotherapy with carboplatin for AUC of 5 and Alimta 500 mg per metered squared.  She is status post 15 cycles and she tolerated it fairly well despite her ongoing issues with fatigue, chest tightness, cough, and weight loss.  Alimta was reduced to 400 mg per metered squared due to renal insufficiency.  She is not a good candidate for Avastin due to her  recent CVA in August 2022.  She also is continuing to take Tagrisso as it is protective against progressive metastatic disease to the brain. Starting from cycle #7,  she started maintenance single agent alimta.    The patient underwent a repeat bronchoscopy and biopsy for repeat molecular testing.  The sample from the left and right lung biopsy was negative for malignancy. This was performed in November 2022.    The patient saw Dr. Durenda Hurt from [facility] for second opinion in November 2022.  They  discussed if she progresses on chemotherapy that there may be upcoming options. He discussed other treatment options which may be available in 2023 including patritumab deruxtcan and lazertinib/amivantmab. If these are not available, he wrote that they can determine if they can get the agents through an expanded access, single patient IND, or clinical trial if she has progression with chemotherapy.    Today 02/02/22, the patient is going to have her infusion done at the [facility].  She will also have lab work drawn there today.  If her labs are out of parameters, request a staff message permission for treatment.  Also if any hypotension or abnormalities with her vitals, recommended that they reach out to me.  I will be available by staff message and secure chat.  He is scheduled to undergo cycle #16 today.  Will see her back for follow-up visit in 3 weeks for evaluation and repeat blood work before starting cycle #17.  She will continue to follow with pulmonary medicine regarding her breathing treatment regimen.  She will continue taking her Integra plus for anemia.  Patient is scheduled for mammogram next month.  Encouraged her to keep this as scheduled.  The patient has cut the dose of her Coreg to once a day versus twice daily.  I discussed with the patient that if she is going to self adjust any of her medication doses that it is important to reach out to the provider who  prescribed this medication to let them know and ask for their approval.  Encouraged that she reach out to her cardiologist to let them know about the lightheadedness with the twice daily dosing of Coreg.  I discussed the assessment and treatment plan with the patient. The patient was provided an opportunity to ask questions and all were answered. The patient agreed with the plan and demonstrated an understanding of the instructions.  The patient was advised to call back or seek an in-person evaluation if the symptoms worsen or if the condition fails to improve as anticipated.  I provided 20 minutes of non face-to-face telephone visit time during this encounter, and > 50% was spent counseling as documented under my assessment & plan.  Allison Senseney L Iesha Summerhill, PA-C 01/27/2022 9:12 AM  No orders of the defined types were placed in this encounter.    Justus Duerr L Zerah Hilyer, PA-C 01/27/22

## 2022-01-31 ENCOUNTER — Ambulatory Visit: Payer: Medicare HMO | Admitting: Acute Care

## 2022-01-31 ENCOUNTER — Encounter: Payer: Self-pay | Admitting: Acute Care

## 2022-01-31 ENCOUNTER — Other Ambulatory Visit: Payer: Self-pay | Admitting: *Deleted

## 2022-01-31 VITALS — BP 104/64 | HR 83 | Temp 98.0°F | Ht 64.0 in | Wt 117.6 lb

## 2022-01-31 DIAGNOSIS — C3491 Malignant neoplasm of unspecified part of right bronchus or lung: Secondary | ICD-10-CM | POA: Diagnosis not present

## 2022-01-31 DIAGNOSIS — J471 Bronchiectasis with (acute) exacerbation: Secondary | ICD-10-CM

## 2022-01-31 NOTE — Progress Notes (Unsigned)
History of Present Illness Allison [name] is a 71 y.o. female  former smoker with stage 4 adenocarcinoma of the right lung, (12/2017), on chemo, hypertension, CVA, CAD, DVT/PE (on Lovenox),iron deficiency anemia,  and GERD. She is followed by Dr. Valeta Harms.   01/31/2022 Pt. Presents for follow up . She was last seen 01/17/2022. She was treated with Augmentin and a prednisone taper. She used her flutter valve and chest vest with mucolytics to mobilize secretions. She was unable to obtain a sputum specimen.She completed her Augmentin and her prednisone taper. She feels much better. Cough is better. She states she has been able to mobilize secretions. Cough is non-productive. The few secretions she has are white. She is so much stronger and less weak.  She does better in the mornings, and she paces herself. The chest tightness is gone. She has chemo 02/02/2022. She goes every 3 weeks for chemo. She is doing Duo Nebs once daily  Test Results:     Latest Ref Rng & Units 01/12/2022   10:03 AM 12/22/2021   12:47 PM 12/01/2021    9:47 AM  CBC  WBC 4.0 - 10.5 K/uL 3.6  3.7  5.1   Hemoglobin 12.0 - 15.0 g/dL 9.6  10.0  9.8   Hematocrit 36.0 - 46.0 % 30.0  30.7  30.4   Platelets 150 - 400 K/uL 213  241  213        Latest Ref Rng & Units 01/12/2022   10:03 AM 12/22/2021   12:47 PM 12/01/2021    9:47 AM  BMP  Glucose 70 - 99 mg/dL 153  122  126   BUN 8 - 23 mg/dL 19  22  17    Creatinine 0.44 - 1.00 mg/dL 1.29  1.35  1.18   Sodium 135 - 145 mmol/L 141  140  139   Potassium 3.5 - 5.1 mmol/L 3.7  3.3  3.2   Chloride 98 - 111 mmol/L 107  104  105   CO2 22 - 32 mmol/L 26  29  27    Calcium 8.9 - 10.3 mg/dL 9.2  9.4  9.7     BNP    Component Value Date/Time   BNP 35.4 02/07/2021 1640    ProBNP No results found for: "PROBNP"  PFT No results found for: "FEV1PRE", "FEV1POST", "FVCPRE", "FVCPOST", "TLC", "[place]", "PREFEV1FVCRT", "PSTFEV1FVCRT"  DG Chest 2 View  Result Date: 01/17/2022 CLINICAL DATA:   Bronchiectasis EXAM: CHEST - 2 VIEW COMPARISON:  Chest radiographs, 04/26/2021, CT chest, 01/10/2022 FINDINGS: Right chest port catheter. Unchanged consolidation and fibrosis of the right pulmonary apex. Unchanged small right pleural effusion. The left lung is normally aerated. Heart and mediastinum are normal. Osseous structures unremarkable. IMPRESSION: 1. Unchanged consolidation and fibrosis of the right pulmonary apex. Unchanged small right pleural effusion. 2.  The left lung is normally aerated. Electronically Signed   By: Delanna Ahmadi M.D.   On: 01/17/2022 10:05   CT CHEST ABDOMEN PELVIS W CONTRAST  Result Date: 01/10/2022 CLINICAL DATA:  Status post radiation therapy for lung cancer. On chemotherapy. Chest tube. Hysterectomy. Small-cell primary. * Tracking Code: BO * EXAM: CT CHEST, ABDOMEN, AND PELVIS WITH CONTRAST TECHNIQUE: Multidetector CT imaging of the chest, abdomen and pelvis was performed following the standard protocol during bolus administration of intravenous contrast. RADIATION DOSE REDUCTION: This exam was performed according to the departmental dose-optimization program which includes automated exposure control, adjustment of the mA and/or kV according to patient size and/or use of iterative reconstruction  technique. CONTRAST:  81mL OMNIPAQUE IOHEXOL 300 MG/ML  SOLN COMPARISON:  11/07/2021 chest CT.  09/06/2021 abdominopelvic CT. FINDINGS: CT CHEST FINDINGS Cardiovascular: Right Port-A-Cath terminates at mid right atrium. Aortic atherosclerosis. Normal heart size, without pericardial effusion. Right coronary artery calcification. No central pulmonary embolism, on this non-dedicated study. Mediastinum/Nodes: No supraclavicular adenopathy. Presumably radiation induced mediastinal edema again identified, without well-defined adenopathy. No hilar adenopathy. Lungs/Pleura: Trace right-sided pleural fluid is similar. The right apical lung nodule is again difficult to delineate from surrounding  radiation induced consolidation. Measures on the order of 1.6 x 1.4 cm on 28/4 versus 2.0 x 1.6 cm on the prior exam. Smooth septal thickening within the right lung base is unchanged, favored to be radiation induced. Similar configuration of right apical, medial right upper lobe, and superior segment right lower lobe geographic consolidation with air bronchograms. 3 mm left lower lobe calcified granuloma. Musculoskeletal: No acute osseous abnormality. CT ABDOMEN PELVIS FINDINGS Hepatobiliary: Normal liver. Tiny dependent gallstone. No acute cholecystitis or biliary duct dilatation. Pancreas: Normal, without mass or ductal dilatation. Spleen: Normal in size, without focal abnormality. Adrenals/Urinary Tract: Normal adrenal glands. Lower pole right renal scarring. Right renal too small to characterize lesions. Normal left kidney. No hydronephrosis. Normal urinary bladder. Stomach/Bowel: Normal stomach, without wall thickening. Normal colon and terminal ileum. Normal small bowel. Vascular/Lymphatic: Aortic atherosclerosis. No abdominopelvic adenopathy. Reproductive: Hysterectomy.  No adnexal mass. Other: No significant free fluid. Mild pelvic floor laxity. No evidence of omental or peritoneal disease. Musculoskeletal: Presumably iatrogenic subcutaneous nodularity and gas about the anterior abdominopelvic wall. The L2 left pedicle sclerotic lesion is less distinct today. Example 57/2. Trace L5-S1 anterolisthesis is similar. Transitional S1 vertebral body. IMPRESSION: 1. Similar appearance of radiation change throughout the right upper lobe and superior segment right lower lobe. The right apical lung nodule is difficult to differentiate from surrounding consolidation, but felt to be slightly decreased in size compared to 11/07/2021. 2. Similar small right pleural effusion. 3. No findings of soft tissue metastatic disease within the chest, abdomen, or pelvis. 4. Previously described left L2 pedicle lesion is less distinct  today, likely representing response to therapy of osseous metastasis. 5. Aortic atherosclerosis (ICD10-I70.0), coronary artery atherosclerosis and emphysema (ICD10-J43.9). 6. Cholelithiasis Electronically Signed   By: Abigail Miyamoto M.D.   On: 01/10/2022 17:59     Past medical hx Past Medical History:  Diagnosis Date   Anemia    Anxiety    Arthritis    Asthma    exercise induced   Depression    PMH   Dyspnea    GERD (gastroesophageal reflux disease)    Glaucoma    History of radiation therapy 01/05/2021   IMRT right lung  11/24/2020-01/05/2021  Dr [name]   Hypertension    lung ca dx'd 11/2017   right   Malignant pleural effusion    right   Nuclear sclerotic cataract of right eye 02/28/2021   Dr. Warden Fillers, cataract surgery February 2023   PONV (postoperative nausea and vomiting)    Pre-diabetes    Raynaud's disease    Raynaud's disease    Stroke ([place]) 01/2021   balance off, some express aphasia, weakness     Social History   Tobacco Use   Smoking status: Never   Smokeless tobacco: Never  Vaping Use   Vaping Use: Never used  Substance Use Topics   Alcohol use: Not Currently    Comment: up to 3 drinks per week   Drug use: No  Comment: CBD oil     Ms.Reid reports that she has never smoked. She has never used smokeless tobacco. She reports that she does not currently use alcohol. She reports that she does not use drugs.  Tobacco Cessation: Counseling given: Not Answered   Past surgical hx, Family hx, Social hx all reviewed.  Current Outpatient Medications on File Prior to Visit  Medication Sig   acetaminophen (TYLENOL) 500 MG tablet Take 1,000 mg by mouth every 6 (six) hours as needed (pain).   ALPRAZolam (XANAX) 0.25 MG tablet Take 1 tablet (0.25 mg total) by mouth 3 (three) times daily as needed for anxiety. May take 2 tablets (0.5 mg total) at bedtime.   aspirin 81 MG chewable tablet Chew 1 tablet (81 mg total) by mouth daily.    brompheniramine-pseudoephedrine-DM 30-2-10 MG/5ML syrup Take 5 mLs by mouth 3 (three) times daily as needed.   carvedilol (COREG) 12.5 MG tablet TAKE 1 TABLET BY MOUTH 2 TIMES DAILY.   cetirizine (ZYRTEC) 5 MG tablet Take 5 mg by mouth daily.   dexamethasone (DECADRON) 4 MG tablet Take 1 tablet twice a day the day before, the day of, and the day after chemotherapy   dorzolamide-timolol (COSOPT) 22.3-6.8 MG/ML ophthalmic solution Place 1 drop into both eyes 2 (two) times daily.   dronabinol (MARINOL) 2.5 MG capsule Take 1 capsule (2.5 mg total) by mouth 2 (two) times daily as needed (decreased appetite).   enoxaparin (LOVENOX) 60 MG/0.6ML injection Inject 0.6 mLs (60 mg total) into the skin every 12 (twelve) hours. (Patient taking differently: Inject 80 mg into the skin daily. 80 mg)   esomeprazole (NEXIUM) 20 MG capsule Take 1 capsule (20 mg total) by mouth in the morning and at bedtime.   FLUoxetine (PROZAC) 20 MG capsule Take 1 capsule (20 mg total) by mouth daily.   folic acid (FOLVITE) 1 MG tablet TAKE 1 TABLET BY MOUTH EVERY DAY   hyoscyamine (LEVSIN SL) 0.125 MG SL tablet Place 2 tablets (0.25 mg total) under the tongue every 6 (six) hours as needed for cramping. Take 1-2 tablets under the tongue every 6 hours as needed for cramping   ipratropium-albuterol (DUONEB) 0.5-2.5 (3) MG/3ML SOLN Take 3 mLs by nebulization every 6 (six) hours as needed (Asthma).   isosorbide mononitrate (IMDUR) 30 MG 24 hr tablet Take 30 mg by mouth daily.   latanoprost (XALATAN) 0.005 % ophthalmic solution Place 1 drop into both eyes at bedtime.    ondansetron (ZOFRAN-ODT) 4 MG disintegrating tablet Take 1 tablet (4 mg total) by mouth every 8 (eight) hours as needed for nausea or vomiting.   osimertinib mesylate (TAGRISSO) 80 MG tablet Take 1 tablet (80 mg total) by mouth daily.   REPATHA SURECLICK 951 MG/ML SOAJ INJECT 140 MG INTO THE SKIN EVERY 14 (FOURTEEN) DAYS.   No current facility-administered medications on  file prior to visit.     Allergies  Allergen Reactions   Hydrocodone-Acetaminophen Other (See Comments)   Penicillins Other (See Comments)    SYNCOPE PATIENT HAS HAD A PCN REACTION WITH IMMEDIATE RASH, FACIAL/TONGUE/THROAT SWELLING, SOB, OR LIGHTHEADEDNESS WITH HYPOTENSION:  #  #  YES  #  # Has patient had a PCN reaction causing severe rash involving mucus membranes or skin necrosis: No Has patient had a PCN reaction that required hospitalization: No Has patient had a PCN reaction occurring within the last 10 years: No  Other reaction(s): other   Vicodin [Hydrocodone-Acetaminophen] Other (See Comments)    Sped up heart and  breathing   Other Other (See Comments)    Glaucoma eye drop - turned eyes dark    Review Of Systems:  Constitutional:   No  weight loss, night sweats,  Fevers, chills, fatigue, or  lassitude.  HEENT:   No headaches,  Difficulty swallowing,  Tooth/dental problems, or  Sore throat,                No sneezing, itching, ear ache, nasal congestion, post nasal drip,   CV:  No chest pain,  Orthopnea, PND, swelling in lower extremities, anasarca, dizziness, palpitations, syncope.   GI  No heartburn, indigestion, abdominal pain, nausea, vomiting, diarrhea, change in bowel habits, loss of appetite, bloody stools.   Resp: No shortness of breath with exertion or at rest.  No excess mucus, no productive cough,  No non-productive cough,  No coughing up of blood.  No change in color of mucus.  No wheezing.  No chest wall deformity  Skin: no rash or lesions.  GU: no dysuria, change in color of urine, no urgency or frequency.  No flank pain, no hematuria   MS:  No joint pain or swelling.  No decreased range of motion.  No back pain.  Psych:  No change in mood or affect. No depression or anxiety.  No memory loss.   Vital Signs BP 104/64 (BP Location: Left Arm, Cuff Size: Normal)   Pulse 83   Temp 98 F (36.7 C) (Oral)   Ht 5\' 4"  (1.626 m)   Wt 117 lb 9.6 oz (53.3 kg)    LMP  (LMP Unknown)   SpO2 98%   BMI 20.19 kg/m    Physical Exam:  General- No distress,  A&Ox3 ENT: No sinus tenderness, TM clear, pale nasal mucosa, no oral exudate,no post nasal drip, no LAN Cardiac: S1, S2, regular rate and rhythm, no murmur Chest: No wheeze/ rales/ dullness; no accessory muscle use, no nasal flaring, no sternal retractions Abd.: Soft Non-tender Ext: No clubbing cyanosis, edema Neuro:  normal strength Skin: No rashes, warm and dry Psych: normal mood and behavior   Assessment/Plan  No problem-specific Assessment & Plan notes found for this encounter.    Magdalen Spatz, NP 01/31/2022  11:21 AM

## 2022-01-31 NOTE — Patient Outreach (Signed)
  Care Coordination   01/31/2022 Name: Allison [name] MRN: [id number] DOB: 1950/11/13   Care Coordination Outreach Attempts:  An unsuccessful telephone outreach was attempted today to offer the patient information about available care coordination services as a benefit of their health plan.   Follow Up Plan:  Additional outreach attempts will be made to offer the patient care coordination information and services.   Encounter Outcome:  No Answer  Care Coordination Interventions Activated:  No   Care Coordination Interventions:  No, not indicated    Shatana Saxton C. Myrtie Neither, MSN, [facility] Gerontological Nurse Practitioner [facility] Care Management [phone]

## 2022-01-31 NOTE — Patient Instructions (Addendum)
It is good to see you today.  I am so glad you are feeling better. Continue Mucinex 1200 mg ( liquid of tablets) daily Use Flutter valve or chest vest to help mobilize the mucus so you can cough it up.  Continue Duo Nebs  once daily   Follow up with Dr. Valeta Harms 02/15/2022 if needed after chemo , if you feel ok, call and cancel  Call for any changes or if you need to see [place] sooner.  Please contact office for sooner follow up if symptoms do not improve or worsen or seek emergency care.  Do Mucinex , then DuoNeb, then chest vest.

## 2022-02-01 ENCOUNTER — Encounter: Payer: Self-pay | Admitting: Acute Care

## 2022-02-01 ENCOUNTER — Other Ambulatory Visit: Payer: Self-pay | Admitting: *Deleted

## 2022-02-01 NOTE — Patient Outreach (Signed)
  Care Coordination   Initial Visit Note   02/01/2022 Name: Allison [name] MRN: [id number] DOB: 1951/05/16  Allison [name] is a 71 y.o. year old female who sees [name], MD for primary care. I spoke with  [name] by phone today  What matters to the patients health and wellness today?  I NEED HELP FINDING A HYPNOTIST IN THE [place].    Goals Addressed             This Visit's Progress    COMPLETED: I need a hypnotist to help me with my anxiety.       Care Coordination Interventions:   *Discussed pt concerns: high anxiety, takes hs alprazolam but does not want to take it in    the daytime. Has seen a mental health provider and this did not achieve the result she was hoping for. Found that [facility] does not have a specialist for this service.  *Provided a list of providers in [place] that do. Advised her to look through the list and call those that seem would be a good fit.            SDOH assessments and interventions completed:  Yes   Care Coordination Interventions Activated:  Yes  Care Coordination Interventions:  Yes, provided   Follow up plan: No further intervention required.   Encounter Outcome:  Pt. Visit Completed   Kayleen Memos C. Myrtie Neither, MSN, [facility] Gerontological Nurse Practitioner [facility] Care Management [phone]

## 2022-02-02 ENCOUNTER — Ambulatory Visit: Payer: Medicare HMO | Admitting: Internal Medicine

## 2022-02-02 ENCOUNTER — Inpatient Hospital Stay: Payer: Medicare HMO | Attending: Internal Medicine

## 2022-02-02 ENCOUNTER — Inpatient Hospital Stay (HOSPITAL_BASED_OUTPATIENT_CLINIC_OR_DEPARTMENT_OTHER): Payer: Medicare HMO | Admitting: Physician Assistant

## 2022-02-02 ENCOUNTER — Inpatient Hospital Stay: Payer: Medicare HMO | Admitting: Physician Assistant

## 2022-02-02 ENCOUNTER — Ambulatory Visit: Payer: Medicare HMO

## 2022-02-02 ENCOUNTER — Other Ambulatory Visit: Payer: Medicare HMO

## 2022-02-02 ENCOUNTER — Inpatient Hospital Stay: Payer: Medicare HMO

## 2022-02-02 ENCOUNTER — Other Ambulatory Visit: Payer: Self-pay | Admitting: Internal Medicine

## 2022-02-02 VITALS — BP 127/79 | HR 72 | Temp 98.5°F | Resp 20

## 2022-02-02 DIAGNOSIS — C3411 Malignant neoplasm of upper lobe, right bronchus or lung: Secondary | ICD-10-CM | POA: Insufficient documentation

## 2022-02-02 DIAGNOSIS — Z5111 Encounter for antineoplastic chemotherapy: Secondary | ICD-10-CM | POA: Insufficient documentation

## 2022-02-02 DIAGNOSIS — C3491 Malignant neoplasm of unspecified part of right bronchus or lung: Secondary | ICD-10-CM

## 2022-02-02 DIAGNOSIS — D509 Iron deficiency anemia, unspecified: Secondary | ICD-10-CM

## 2022-02-02 LAB — CBC WITH DIFFERENTIAL (CANCER CENTER ONLY)
Abs Immature Granulocytes: 0.02 10*3/uL (ref 0.00–0.07)
Basophils Absolute: 0 10*3/uL (ref 0.0–0.1)
Basophils Relative: 0 %
Eosinophils Absolute: 0 10*3/uL (ref 0.0–0.5)
Eosinophils Relative: 0 %
HCT: 30 % — ABNORMAL LOW (ref 36.0–46.0)
Hemoglobin: 9.5 g/dL — ABNORMAL LOW (ref 12.0–15.0)
Immature Granulocytes: 0 %
Lymphocytes Relative: 8 %
Lymphs Abs: 0.5 10*3/uL — ABNORMAL LOW (ref 0.7–4.0)
MCH: 28.3 pg (ref 26.0–34.0)
MCHC: 31.7 g/dL (ref 30.0–36.0)
MCV: 89.3 fL (ref 80.0–100.0)
Monocytes Absolute: 0.2 10*3/uL (ref 0.1–1.0)
Monocytes Relative: 4 %
Neutro Abs: 5 10*3/uL (ref 1.7–7.7)
Neutrophils Relative %: 88 %
Platelet Count: 190 10*3/uL (ref 150–400)
RBC: 3.36 MIL/uL — ABNORMAL LOW (ref 3.87–5.11)
RDW: 15.4 % (ref 11.5–15.5)
WBC Count: 5.7 10*3/uL (ref 4.0–10.5)
nRBC: 0 % (ref 0.0–0.2)

## 2022-02-02 LAB — CMP (CANCER CENTER ONLY)
ALT: 50 U/L — ABNORMAL HIGH (ref 0–44)
AST: 34 U/L (ref 15–41)
Albumin: 3.8 g/dL (ref 3.5–5.0)
Alkaline Phosphatase: 55 U/L (ref 38–126)
Anion gap: 12 (ref 5–15)
BUN: 22 mg/dL (ref 8–23)
CO2: 22 mmol/L (ref 22–32)
Calcium: 9.1 mg/dL (ref 8.9–10.3)
Chloride: 104 mmol/L (ref 98–111)
Creatinine: 1.15 mg/dL — ABNORMAL HIGH (ref 0.44–1.00)
GFR, Estimated: 51 mL/min — ABNORMAL LOW (ref >60)
Glucose, Bld: 145 mg/dL — ABNORMAL HIGH (ref 70–99)
Potassium: 3.7 mmol/L (ref 3.5–5.1)
Sodium: 138 mmol/L (ref 135–145)
Total Bilirubin: 0.3 mg/dL (ref 0.3–1.2)
Total Protein: 6.8 g/dL (ref 6.5–8.1)

## 2022-02-02 MED ORDER — SODIUM CHLORIDE 0.9 % IV SOLN
10.0000 mg | Freq: Once | INTRAVENOUS | Status: DC
  Filled 2022-02-02: qty 1

## 2022-02-02 MED ORDER — HEPARIN SOD (PORK) LOCK FLUSH 100 UNIT/ML IV SOLN
500.0000 [IU] | Freq: Once | INTRAVENOUS | Status: AC | PRN
  Administered 2022-02-02: 500 [IU]

## 2022-02-02 MED ORDER — PALONOSETRON HCL INJECTION 0.25 MG/5ML: 0.2500 mg | Freq: Once | INTRAVENOUS | Status: DC

## 2022-02-02 MED ORDER — SODIUM CHLORIDE 0.9 % IV SOLN
150.0000 mg | Freq: Once | INTRAVENOUS | Status: DC
  Filled 2022-02-02: qty 5

## 2022-02-02 MED ORDER — SODIUM CHLORIDE 0.9% FLUSH
10.0000 mL | INTRAVENOUS | Status: DC | PRN
  Administered 2022-02-02: 10 mL

## 2022-02-02 MED ORDER — SODIUM CHLORIDE 0.9 % IV SOLN
400.0000 mg/m2 | Freq: Once | INTRAVENOUS | Status: AC
  Administered 2022-02-02: 600 mg via INTRAVENOUS
  Filled 2022-02-02: qty 20

## 2022-02-02 MED ORDER — CYANOCOBALAMIN 1000 MCG/ML IJ SOLN: 1000.0000 ug | Freq: Once | INTRAMUSCULAR | Status: DC

## 2022-02-02 MED ORDER — SODIUM CHLORIDE 0.9 % IV SOLN: Freq: Once | INTRAVENOUS | Status: AC

## 2022-02-02 MED ORDER — ONDANSETRON HCL 4 MG/2ML IJ SOLN
8.0000 mg | Freq: Once | INTRAMUSCULAR | Status: AC
  Administered 2022-02-02: 8 mg via INTRAVENOUS
  Filled 2022-02-02: qty 4

## 2022-02-02 NOTE — Patient Instructions (Signed)
[facility]   Discharge Instructions: Thank you for choosing [facility] to provide your oncology and hematology care.   If you have a lab appointment with the [facility], please go directly to the [facility] and check in at the registration area.   Wear comfortable clothing and clothing appropriate for easy access to any Portacath or PICC line.   We strive to give you quality time with your provider. You may need to reschedule your appointment if you arrive late (15 or more minutes).  Arriving late affects you and other patients whose appointments are after yours.  Also, if you miss three or more appointments without notifying the office, you may be dismissed from the clinic at the provider's discretion.      For prescription refill requests, have your pharmacy contact our office and allow 72 hours for refills to be completed.    Today you received the following chemotherapy and/or immunotherapy agents Pemetrexed (ALIMTA).      To help prevent nausea and vomiting after your treatment, we encourage you to take your nausea medication as directed.  BELOW ARE SYMPTOMS THAT SHOULD BE REPORTED IMMEDIATELY: *FEVER GREATER THAN 100.4 F (38 C) OR HIGHER *CHILLS OR SWEATING *NAUSEA AND VOMITING THAT IS NOT CONTROLLED WITH YOUR NAUSEA MEDICATION *UNUSUAL SHORTNESS OF BREATH *UNUSUAL BRUISING OR BLEEDING *URINARY PROBLEMS (pain or burning when urinating, or frequent urination) *BOWEL PROBLEMS (unusual diarrhea, constipation, pain near the anus) TENDERNESS IN MOUTH AND THROAT WITH OR WITHOUT PRESENCE OF ULCERS (sore throat, sores in mouth, or a toothache) UNUSUAL RASH, SWELLING OR PAIN  UNUSUAL VAGINAL DISCHARGE OR ITCHING   Items with * indicate a potential emergency and should be followed up as soon as possible or go to the Emergency Department if any problems should occur.  Please show the CHEMOTHERAPY ALERT CARD or IMMUNOTHERAPY ALERT CARD at  check-in to the Emergency Department and triage nurse.  Should you have questions after your visit or need to cancel or reschedule your appointment, please contact [facility]  Dept: [phone]  and follow the prompts.  Office hours are 8:00 a.m. to 4:30 p.m. Monday - Friday. Please note that voicemails left after 4:00 p.m. may not be returned until the following business day.  We are closed weekends and major holidays. You have access to a nurse at all times for urgent questions. Please call the main number to the clinic Dept: [phone] and follow the prompts.   For any non-urgent questions, you may also contact your provider using MyChart. We now offer e-Visits for anyone 25 and older to request care online for non-urgent symptoms. For details visit [email or web address].   Also download the MyChart app! Go to the app store, search "MyChart", open the app, select [facility], and log in with your MyChart username and password.  Masks are optional in the cancer centers. If you would like for your care team to wear a mask while they are taking care of you, please let them know. You may have one support person who is at least 71 years old accompany you for your appointments.  Pemetrexed Injection What is this medication? PEMETREXED (PEM e TREX ed) treats some types of cancer. It works by slowing down the growth of cancer cells. This medicine may be used for other purposes; ask your health care provider or pharmacist if you have questions. COMMON BRAND NAME(S): Alimta, PEMFEXY What should I tell my care team before I  take this medication? They need to know if you have any of these conditions: Infection, such as chickenpox, cold sores, or herpes Kidney disease Low blood cell levels (white cells, red cells, and platelets) Lung or breathing disease, such as asthma Radiation therapy An unusual or allergic reaction to pemetrexed, other medications, foods, dyes, or  preservatives If you or your partner are pregnant or trying to get pregnant Breast-feeding How should I use this medication? This medication is injected into a vein. It is given by your care team in a hospital or clinic setting. Talk to your care team about the use of this medication in children. Special care may be needed. Overdosage: If you think you have taken too much of this medicine contact a poison control center or emergency room at once. NOTE: This medicine is only for you. Do not share this medicine with others. What if I miss a dose? Keep appointments for follow-up doses. It is important not to miss your dose. Call your care team if you are unable to keep an appointment. What may interact with this medication? Do not take this medication with any of the following: Live virus vaccines This medication may also interact with the following: Ibuprofen This list may not describe all possible interactions. Give your health care provider a list of all the medicines, herbs, non-prescription drugs, or dietary supplements you use. Also tell them if you smoke, drink alcohol, or use illegal drugs. Some items may interact with your medicine. What should I watch for while using this medication? Your condition will be monitored carefully while you are receiving this medication. This medication may make you feel generally unwell. This is not uncommon as chemotherapy can affect healthy cells as well as cancer cells. Report any side effects. Continue your course of treatment even though you feel ill unless your care team tells you to stop. This medication can cause serious side effects. To reduce the risk, your care team may give you other medications to take before receiving this one. Be sure to follow the directions from your care team. This medication can cause a rash or redness in areas of the body that have previously had radiation therapy. If you have had radiation therapy, tell your care team if  you notice a rash in this area. This medication may increase your risk of getting an infection. Call your care team for advice if you get a fever, chills, sore throat, or other symptoms of a cold or flu. Do not treat yourself. Try to avoid being around people who are sick. Be careful brushing or flossing your teeth or using a toothpick because you may get an infection or bleed more easily. If you have any dental work done, tell your dentist you are receiving this medication. Avoid taking medications that contain aspirin, acetaminophen, ibuprofen, naproxen, or ketoprofen unless instructed by your care team. These medications may hide a fever. Check with your care team if you have severe diarrhea, nausea, and vomiting, or if you sweat a lot. The loss of too much body fluid may make it dangerous for you to take this medication. Talk to your care team if you or your partner wish to become pregnant or think either of you might be pregnant. This medication can cause serious birth defects if taken during pregnancy and for 6 months after the last dose. A negative pregnancy test is required before starting this medication. A reliable form of contraception is recommended while taking this medication and for 6 months  after the last dose. Talk to your care team about reliable forms of contraception. Do not father a child while taking this medication and for 3 months after the last dose. Use a condom while having sex during this time period. Do not breastfeed while taking this medication and for 1 week after the last dose. This medication may cause infertility. Talk to your care team if you are concerned about your fertility. What side effects may I notice from receiving this medication? Side effects that you should report to your care team as soon as possible: Allergic reactions--skin rash, itching, hives, swelling of the face, lips, tongue, or throat Dry cough, shortness of breath or trouble  breathing Infection--fever, chills, cough, sore throat, wounds that don't heal, pain or trouble when passing urine, general feeling of discomfort or being unwell Kidney injury--decrease in the amount of urine, swelling of the ankles, hands, or feet Low red blood cell level--unusual weakness or fatigue, dizziness, headache, trouble breathing Redness, blistering, peeling, or loosening of the skin, including inside the mouth Unusual bruising or bleeding Side effects that usually do not require medical attention (report to your care team if they continue or are bothersome): Fatigue Loss of appetite Nausea Vomiting This list may not describe all possible side effects. Call your doctor for medical advice about side effects. You may report side effects to FDA at 1-800-FDA-1088. Where should I keep my medication? This medication is given in a hospital or clinic. It will not be stored at home. NOTE: This sheet is a summary. It may not cover all possible information. If you have questions about this medicine, talk to your doctor, pharmacist, or health care provider.  2023 Elsevier/Gold Standard (2021-10-10 00:00:00)

## 2022-02-07 ENCOUNTER — Other Ambulatory Visit: Payer: Medicare HMO | Admitting: Internal Medicine

## 2022-02-15 ENCOUNTER — Ambulatory Visit: Payer: Medicare HMO | Admitting: Pulmonary Disease

## 2022-02-23 ENCOUNTER — Encounter: Payer: Self-pay | Admitting: Nurse Practitioner

## 2022-02-23 ENCOUNTER — Inpatient Hospital Stay: Payer: Medicare HMO

## 2022-02-23 ENCOUNTER — Ambulatory Visit: Payer: Medicare HMO | Admitting: Physician Assistant

## 2022-02-23 ENCOUNTER — Inpatient Hospital Stay: Payer: Medicare HMO | Admitting: Internal Medicine

## 2022-02-23 ENCOUNTER — Other Ambulatory Visit: Payer: Self-pay

## 2022-02-23 ENCOUNTER — Inpatient Hospital Stay: Payer: Medicare HMO | Attending: Internal Medicine | Admitting: Nurse Practitioner

## 2022-02-23 ENCOUNTER — Ambulatory Visit: Payer: Medicare HMO

## 2022-02-23 ENCOUNTER — Other Ambulatory Visit: Payer: Self-pay | Admitting: Cardiology

## 2022-02-23 ENCOUNTER — Other Ambulatory Visit: Payer: Medicare HMO

## 2022-02-23 VITALS — BP 168/74 | HR 72 | Temp 98.2°F | Resp 15 | Ht 64.0 in | Wt 119.4 lb

## 2022-02-23 DIAGNOSIS — C3411 Malignant neoplasm of upper lobe, right bronchus or lung: Secondary | ICD-10-CM | POA: Diagnosis present

## 2022-02-23 DIAGNOSIS — E876 Hypokalemia: Secondary | ICD-10-CM | POA: Insufficient documentation

## 2022-02-23 DIAGNOSIS — Z8673 Personal history of transient ischemic attack (TIA), and cerebral infarction without residual deficits: Secondary | ICD-10-CM | POA: Insufficient documentation

## 2022-02-23 DIAGNOSIS — K30 Functional dyspepsia: Secondary | ICD-10-CM | POA: Insufficient documentation

## 2022-02-23 DIAGNOSIS — C3491 Malignant neoplasm of unspecified part of right bronchus or lung: Secondary | ICD-10-CM

## 2022-02-23 DIAGNOSIS — Z79899 Other long term (current) drug therapy: Secondary | ICD-10-CM | POA: Insufficient documentation

## 2022-02-23 DIAGNOSIS — D649 Anemia, unspecified: Secondary | ICD-10-CM | POA: Diagnosis not present

## 2022-02-23 DIAGNOSIS — R530 Neoplastic (malignant) related fatigue: Secondary | ICD-10-CM | POA: Diagnosis not present

## 2022-02-23 DIAGNOSIS — Z95828 Presence of other vascular implants and grafts: Secondary | ICD-10-CM

## 2022-02-23 DIAGNOSIS — R590 Localized enlarged lymph nodes: Secondary | ICD-10-CM | POA: Insufficient documentation

## 2022-02-23 DIAGNOSIS — Z515 Encounter for palliative care: Secondary | ICD-10-CM

## 2022-02-23 DIAGNOSIS — Z923 Personal history of irradiation: Secondary | ICD-10-CM | POA: Insufficient documentation

## 2022-02-23 DIAGNOSIS — N289 Disorder of kidney and ureter, unspecified: Secondary | ICD-10-CM | POA: Insufficient documentation

## 2022-02-23 DIAGNOSIS — Z5111 Encounter for antineoplastic chemotherapy: Secondary | ICD-10-CM

## 2022-02-23 DIAGNOSIS — F419 Anxiety disorder, unspecified: Secondary | ICD-10-CM | POA: Diagnosis not present

## 2022-02-23 LAB — CMP (CANCER CENTER ONLY)
ALT: 60 U/L — ABNORMAL HIGH (ref 0–44)
AST: 47 U/L — ABNORMAL HIGH (ref 15–41)
Albumin: 4 g/dL (ref 3.5–5.0)
Alkaline Phosphatase: 68 U/L (ref 38–126)
Anion gap: 8 (ref 5–15)
BUN: 19 mg/dL (ref 8–23)
CO2: 27 mmol/L (ref 22–32)
Calcium: 9.8 mg/dL (ref 8.9–10.3)
Chloride: 106 mmol/L (ref 98–111)
Creatinine: 1.18 mg/dL — ABNORMAL HIGH (ref 0.44–1.00)
GFR, Estimated: 49 mL/min — ABNORMAL LOW (ref >60)
Glucose, Bld: 131 mg/dL — ABNORMAL HIGH (ref 70–99)
Potassium: 3.3 mmol/L — ABNORMAL LOW (ref 3.5–5.1)
Sodium: 141 mmol/L (ref 135–145)
Total Bilirubin: 0.3 mg/dL (ref 0.3–1.2)
Total Protein: 7.3 g/dL (ref 6.5–8.1)

## 2022-02-23 LAB — CBC WITH DIFFERENTIAL (CANCER CENTER ONLY)
Abs Immature Granulocytes: 0.02 10*3/uL (ref 0.00–0.07)
Basophils Absolute: 0 10*3/uL (ref 0.0–0.1)
Basophils Relative: 0 %
Eosinophils Absolute: 0 10*3/uL (ref 0.0–0.5)
Eosinophils Relative: 0 %
HCT: 33.6 % — ABNORMAL LOW (ref 36.0–46.0)
Hemoglobin: 11 g/dL — ABNORMAL LOW (ref 12.0–15.0)
Immature Granulocytes: 0 %
Lymphocytes Relative: 12 %
Lymphs Abs: 0.7 10*3/uL (ref 0.7–4.0)
MCH: 28.9 pg (ref 26.0–34.0)
MCHC: 32.7 g/dL (ref 30.0–36.0)
MCV: 88.2 fL (ref 80.0–100.0)
Monocytes Absolute: 0.5 10*3/uL (ref 0.1–1.0)
Monocytes Relative: 8 %
Neutro Abs: 4.9 10*3/uL (ref 1.7–7.7)
Neutrophils Relative %: 80 %
Platelet Count: 196 10*3/uL (ref 150–400)
RBC: 3.81 MIL/uL — ABNORMAL LOW (ref 3.87–5.11)
RDW: 15.3 % (ref 11.5–15.5)
WBC Count: 6.1 10*3/uL (ref 4.0–10.5)
nRBC: 0 % (ref 0.0–0.2)

## 2022-02-23 MED ORDER — ALPRAZOLAM 0.25 MG PO TABS: 0.2500 mg | ORAL_TABLET | Freq: Two times a day (BID) | ORAL | 0 refills | Status: DC | PRN

## 2022-02-23 MED ORDER — SODIUM CHLORIDE 0.9 % IV SOLN: Freq: Once | INTRAVENOUS | Status: AC

## 2022-02-23 MED ORDER — SODIUM CHLORIDE 0.9 % IV SOLN
400.0000 mg/m2 | Freq: Once | INTRAVENOUS | Status: AC
  Administered 2022-02-23: 600 mg via INTRAVENOUS
  Filled 2022-02-23: qty 20

## 2022-02-23 MED ORDER — SODIUM CHLORIDE 0.9% FLUSH
10.0000 mL | Freq: Once | INTRAVENOUS | Status: AC
  Administered 2022-02-23: 10 mL

## 2022-02-23 MED ORDER — ONDANSETRON HCL 4 MG/2ML IJ SOLN
8.0000 mg | Freq: Once | INTRAMUSCULAR | Status: AC
  Administered 2022-02-23: 8 mg via INTRAVENOUS
  Filled 2022-02-23: qty 4

## 2022-02-23 MED ORDER — POTASSIUM CHLORIDE CRYS ER 20 MEQ PO TBCR: 20.0000 meq | EXTENDED_RELEASE_TABLET | Freq: Every day | ORAL | 0 refills | Status: DC

## 2022-02-23 MED ORDER — HEPARIN SOD (PORK) LOCK FLUSH 100 UNIT/ML IV SOLN
500.0000 [IU] | Freq: Once | INTRAVENOUS | Status: AC | PRN
  Administered 2022-02-23: 500 [IU]

## 2022-02-23 MED ORDER — SODIUM CHLORIDE 0.9% FLUSH
10.0000 mL | INTRAVENOUS | Status: DC | PRN
  Administered 2022-02-23: 10 mL

## 2022-02-23 NOTE — Patient Instructions (Signed)
[facility]   Discharge Instructions: Thank you for choosing [facility] to provide your oncology and hematology care.   If you have a lab appointment with the [facility], please go directly to the [facility] and check in at the registration area.   Wear comfortable clothing and clothing appropriate for easy access to any Portacath or PICC line.   We strive to give you quality time with your provider. You may need to reschedule your appointment if you arrive late (15 or more minutes).  Arriving late affects you and other patients whose appointments are after yours.  Also, if you miss three or more appointments without notifying the office, you may be dismissed from the clinic at the provider's discretion.      For prescription refill requests, have your pharmacy contact our office and allow 72 hours for refills to be completed.    Today you received the following chemotherapy and/or immunotherapy agents Pemetrexed (ALIMTA).      To help prevent nausea and vomiting after your treatment, we encourage you to take your nausea medication as directed.  BELOW ARE SYMPTOMS THAT SHOULD BE REPORTED IMMEDIATELY: *FEVER GREATER THAN 100.4 F (38 C) OR HIGHER *CHILLS OR SWEATING *NAUSEA AND VOMITING THAT IS NOT CONTROLLED WITH YOUR NAUSEA MEDICATION *UNUSUAL SHORTNESS OF BREATH *UNUSUAL BRUISING OR BLEEDING *URINARY PROBLEMS (pain or burning when urinating, or frequent urination) *BOWEL PROBLEMS (unusual diarrhea, constipation, pain near the anus) TENDERNESS IN MOUTH AND THROAT WITH OR WITHOUT PRESENCE OF ULCERS (sore throat, sores in mouth, or a toothache) UNUSUAL RASH, SWELLING OR PAIN  UNUSUAL VAGINAL DISCHARGE OR ITCHING   Items with * indicate a potential emergency and should be followed up as soon as possible or go to the Emergency Department if any problems should occur.  Please show the CHEMOTHERAPY ALERT CARD or IMMUNOTHERAPY ALERT CARD at  check-in to the Emergency Department and triage nurse.  Should you have questions after your visit or need to cancel or reschedule your appointment, please contact [facility]  Dept: [phone]  and follow the prompts.  Office hours are 8:00 a.m. to 4:30 p.m. Monday - Friday. Please note that voicemails left after 4:00 p.m. may not be returned until the following business day.  We are closed weekends and major holidays. You have access to a nurse at all times for urgent questions. Please call the main number to the clinic Dept: [phone] and follow the prompts.   For any non-urgent questions, you may also contact your provider using MyChart. We now offer e-Visits for anyone 25 and older to request care online for non-urgent symptoms. For details visit [email or web address].   Also download the MyChart app! Go to the app store, search "MyChart", open the app, select , and log in with your MyChart username and password.  Masks are optional in the cancer centers. If you would like for your care team to wear a mask while they are taking care of you, please let them know. You may have one support person who is at least 71 years old accompany you for your appointments.  Pemetrexed Injection What is this medication? PEMETREXED (PEM e TREX ed) treats some types of cancer. It works by slowing down the growth of cancer cells. This medicine may be used for other purposes; ask your health care provider or pharmacist if you have questions. COMMON BRAND NAME(S): Alimta, PEMFEXY What should I tell my care team before I  take this medication? They need to know if you have any of these conditions: Infection, such as chickenpox, cold sores, or herpes Kidney disease Low blood cell levels (white cells, red cells, and platelets) Lung or breathing disease, such as asthma Radiation therapy An unusual or allergic reaction to pemetrexed, other medications, foods, dyes, or  preservatives If you or your partner are pregnant or trying to get pregnant Breast-feeding How should I use this medication? This medication is injected into a vein. It is given by your care team in a hospital or clinic setting. Talk to your care team about the use of this medication in children. Special care may be needed. Overdosage: If you think you have taken too much of this medicine contact a poison control center or emergency room at once. NOTE: This medicine is only for you. Do not share this medicine with others. What if I miss a dose? Keep appointments for follow-up doses. It is important not to miss your dose. Call your care team if you are unable to keep an appointment. What may interact with this medication? Do not take this medication with any of the following: Live virus vaccines This medication may also interact with the following: Ibuprofen This list may not describe all possible interactions. Give your health care provider a list of all the medicines, herbs, non-prescription drugs, or dietary supplements you use. Also tell them if you smoke, drink alcohol, or use illegal drugs. Some items may interact with your medicine. What should I watch for while using this medication? Your condition will be monitored carefully while you are receiving this medication. This medication may make you feel generally unwell. This is not uncommon as chemotherapy can affect healthy cells as well as cancer cells. Report any side effects. Continue your course of treatment even though you feel ill unless your care team tells you to stop. This medication can cause serious side effects. To reduce the risk, your care team may give you other medications to take before receiving this one. Be sure to follow the directions from your care team. This medication can cause a rash or redness in areas of the body that have previously had radiation therapy. If you have had radiation therapy, tell your care team if  you notice a rash in this area. This medication may increase your risk of getting an infection. Call your care team for advice if you get a fever, chills, sore throat, or other symptoms of a cold or flu. Do not treat yourself. Try to avoid being around people who are sick. Be careful brushing or flossing your teeth or using a toothpick because you may get an infection or bleed more easily. If you have any dental work done, tell your dentist you are receiving this medication. Avoid taking medications that contain aspirin, acetaminophen, ibuprofen, naproxen, or ketoprofen unless instructed by your care team. These medications may hide a fever. Check with your care team if you have severe diarrhea, nausea, and vomiting, or if you sweat a lot. The loss of too much body fluid may make it dangerous for you to take this medication. Talk to your care team if you or your partner wish to become pregnant or think either of you might be pregnant. This medication can cause serious birth defects if taken during pregnancy and for 6 months after the last dose. A negative pregnancy test is required before starting this medication. A reliable form of contraception is recommended while taking this medication and for 6 months  after the last dose. Talk to your care team about reliable forms of contraception. Do not father a child while taking this medication and for 3 months after the last dose. Use a condom while having sex during this time period. Do not breastfeed while taking this medication and for 1 week after the last dose. This medication may cause infertility. Talk to your care team if you are concerned about your fertility. What side effects may I notice from receiving this medication? Side effects that you should report to your care team as soon as possible: Allergic reactions--skin rash, itching, hives, swelling of the face, lips, tongue, or throat Dry cough, shortness of breath or trouble  breathing Infection--fever, chills, cough, sore throat, wounds that don't heal, pain or trouble when passing urine, general feeling of discomfort or being unwell Kidney injury--decrease in the amount of urine, swelling of the ankles, hands, or feet Low red blood cell level--unusual weakness or fatigue, dizziness, headache, trouble breathing Redness, blistering, peeling, or loosening of the skin, including inside the mouth Unusual bruising or bleeding Side effects that usually do not require medical attention (report to your care team if they continue or are bothersome): Fatigue Loss of appetite Nausea Vomiting This list may not describe all possible side effects. Call your doctor for medical advice about side effects. You may report side effects to FDA at 1-800-FDA-1088. Where should I keep my medication? This medication is given in a hospital or clinic. It will not be stored at home. NOTE: This sheet is a summary. It may not cover all possible information. If you have questions about this medicine, talk to your doctor, pharmacist, or health care provider.  2023 Elsevier/Gold Standard (2021-10-10 00:00:00)

## 2022-02-23 NOTE — Progress Notes (Signed)
[name] [phone]   [phone]  OFFICE PROGRESS NOTE  [name], [place] [address]  DIAGNOSIS: Stage IV (T2 a,N2, M1a) non-small cell lung cancer, adenocarcinoma diagnosed in July 2019 and presented with right upper lobe lung mass in addition to mediastinal lymphadenopathy as well as bilateral pulmonary nodules and malignant right pleural effusion.   Biomarker Findings Microsatellite status - Cannot Be Determined Tumor Mutational Burden - Cannot Be Determined Genomic Findings For a complete list of the genes assayed, please refer to the Appendix. EGFR exon 19 deletion ([id number]) TP53 Y220C 7 Disease relevant genes with no reportable alterations: KRAS, ALK, BRAF, MET, RET, ERBB2, ROS1    PRIOR THERAPY:  1) Status post right Pleurx catheter placement by Dr. Prescott Gum for drainage of malignant right pleural effusion. 2) palliative radiotherapy to the enlarging right upper lobe lung mass and mediastinum under the care of Dr. Sondra Come expected to be completed on January 05, 2021. 3) Tagrisso 80 mg p.o. daily.  First dose was given on 01/29/2018.  Status post 39 months of treatment.   CURRENT THERAPY: Systemic chemotherapy with carboplatin for AUC of 5 and Alimta 500 Mg/M2 every 3 weeks.  First dose 03/17/2021.  The patient will also continue her current treatment with Tagrisso 80 mg p.o. daily.  She is status post 16 cycles. Starting from cycle #7, she will be on Alimta only 400 mg/m2.  INTERVAL HISTORY: Allison [name] 71 y.o. female returns to the clinic today for follow-up visit.  The patient is feeling much better today with less fatigue.  She denied having any current chest pain but has shortness of breath with exertion with no cough or hemoptysis.  She has no nausea, vomiting, diarrhea or constipation.  She has no headache or visual changes.  She is here today for evaluation before starting cycle #17 of  her treatment.  MEDICAL HISTORY: Past Medical History:  Diagnosis Date   Anemia    Anxiety    Arthritis    Asthma    exercise induced   Depression    PMH   Dyspnea    GERD (gastroesophageal reflux disease)    Glaucoma    History of radiation therapy 01/05/2021   IMRT right lung  11/24/2020-01/05/2021  Dr [name]   Hypertension    lung ca dx'd 11/2017   right   Malignant pleural effusion    right   Nuclear sclerotic cataract of right eye 02/28/2021   Dr. Warden Fillers, cataract surgery February 2023   PONV (postoperative nausea and vomiting)    Pre-diabetes    Raynaud's disease    Raynaud's disease    Stroke ([place]) 01/2021   balance off, some express aphasia, weakness    ALLERGIES:  is allergic to hydrocodone-acetaminophen, penicillins, vicodin [hydrocodone-acetaminophen], and other.  MEDICATIONS:  Current Outpatient Medications  Medication Sig Dispense Refill   acetaminophen (TYLENOL) 500 MG tablet Take 1,000 mg by mouth every 6 (six) hours as needed (pain).     ALPRAZolam (XANAX) 0.25 MG tablet Take 1 tablet (0.25 mg total) by mouth 2 (two) times daily as needed for anxiety. May take 2 tablets (0.5 mg total) at bedtime. 60 tablet 0   aspirin 81 MG chewable tablet Chew 1 tablet (81 mg total) by mouth daily.     brompheniramine-pseudoephedrine-DM 30-2-10 MG/5ML syrup Take 5 mLs by mouth 3 (three) times daily as needed. 120 mL 0   carvedilol (COREG) 12.5  MG tablet TAKE 1 TABLET BY MOUTH 2 TIMES DAILY. 180 tablet 1   cetirizine (ZYRTEC) 5 MG tablet Take 5 mg by mouth daily.     dexamethasone (DECADRON) 4 MG tablet Take 1 tablet twice a day the day before, the day of, and the day after chemotherapy 40 tablet 2   dorzolamide-timolol (COSOPT) 22.3-6.8 MG/ML ophthalmic solution Place 1 drop into both eyes 2 (two) times daily.     dronabinol (MARINOL) 2.5 MG capsule Take 1 capsule (2.5 mg total) by mouth 2 (two) times daily as needed (decreased appetite). 60 capsule 0    enoxaparin (LOVENOX) 60 MG/0.6ML injection Inject 0.6 mLs (60 mg total) into the skin every 12 (twelve) hours. (Patient taking differently: Inject 80 mg into the skin daily. 80 mg) 60 mL 1   esomeprazole (NEXIUM) 20 MG capsule Take 1 capsule (20 mg total) by mouth in the morning and at bedtime. 60 capsule 2   FLUoxetine (PROZAC) 20 MG capsule Take 1 capsule (20 mg total) by mouth daily. 90 capsule 3   folic acid (FOLVITE) 1 MG tablet TAKE 1 TABLET BY MOUTH EVERY DAY 90 tablet 1   hyoscyamine (LEVSIN SL) 0.125 MG SL tablet Place 2 tablets (0.25 mg total) under the tongue every 6 (six) hours as needed for cramping. Take 1-2 tablets under the tongue every 6 hours as needed for cramping 30 tablet 0   ipratropium-albuterol (DUONEB) 0.5-2.5 (3) MG/3ML SOLN Take 3 mLs by nebulization every 6 (six) hours as needed (Asthma).     isosorbide mononitrate (IMDUR) 30 MG 24 hr tablet Take 30 mg by mouth daily.     latanoprost (XALATAN) 0.005 % ophthalmic solution Place 1 drop into both eyes at bedtime.      ondansetron (ZOFRAN-ODT) 4 MG disintegrating tablet Take 1 tablet (4 mg total) by mouth every 8 (eight) hours as needed for nausea or vomiting. 30 tablet 1   osimertinib mesylate (TAGRISSO) 80 MG tablet Take 1 tablet (80 mg total) by mouth daily. 30 tablet 3   REPATHA SURECLICK 665 MG/ML SOAJ INJECT 140 MG INTO THE SKIN EVERY 14 (FOURTEEN) DAYS. 6 mL 3   No current facility-administered medications for this visit.    SURGICAL HISTORY:  Past Surgical History:  Procedure Laterality Date   ABDOMINAL HYSTERECTOMY     partial   BRONCHIAL BIOPSY  04/21/2021   Procedure: BRONCHIAL BIOPSIES;  Surgeon: [name], DO;  Location: [facility];  Service: Pulmonary;;   BRONCHIAL BRUSHINGS  04/21/2021   Procedure: BRONCHIAL BRUSHINGS;  Surgeon: [name], DO;  Location: [facility];  Service: Pulmonary;;   BRONCHIAL NEEDLE ASPIRATION BIOPSY  04/21/2021   Procedure: BRONCHIAL NEEDLE ASPIRATION BIOPSIES;   Surgeon: [name], DO;  Location: [facility];  Service: Pulmonary;;   CHEST TUBE INSERTION Right 01/01/2018   Procedure: INSERTION PLEURAL DRAINAGE CATHETER;  Surgeon: Ivin Poot, MD;  Location: [facility];  Service: Thoracic;  Laterality: Right;   CHEST TUBE INSERTION  04/21/2021   Procedure: CHEST TUBE INSERTION;  Surgeon: [name], DO;  Location: [facility];  Service: Pulmonary;;   COLONOSCOPY     CORONARY STENT INTERVENTION N/A 06/16/2019   Procedure: CORONARY STENT INTERVENTION;  Surgeon: Jettie Booze, MD;  Location: [facility];  Service: Cardiovascular;  Laterality: N/A;   DILATION AND CURETTAGE OF UTERUS     EYE SURGERY     due to Glaucoma   IR IMAGING GUIDED PORT INSERTION  03/22/2021   IR  PORT REPAIR CENTRAL VENOUS ACCESS DEVICE  04/15/2021   LEFT HEART CATH AND CORONARY ANGIOGRAPHY N/A 06/16/2019   Procedure: LEFT HEART CATH AND CORONARY ANGIOGRAPHY;  Surgeon: Jettie Booze, MD;  Location: [facility];  Service: Cardiovascular;  Laterality: N/A;   REMOVAL OF PLEURAL DRAINAGE CATHETER Right 11/07/2018   Procedure: REMOVAL OF PLEURAL DRAINAGE CATHETER;  Surgeon: Ivin Poot, MD;  Location: [facility];  Service: Thoracic;  Laterality: Right;   ROTATOR CUFF REPAIR     TUBAL LIGATION     VIDEO BRONCHOSCOPY WITH ENDOBRONCHIAL NAVIGATION Bilateral 04/21/2021   Procedure: VIDEO BRONCHOSCOPY WITH ENDOBRONCHIAL NAVIGATION;  Surgeon: [name], DO;  Location: [facility];  Service: Pulmonary;  Laterality: Bilateral;  ION   WISDOM TOOTH EXTRACTION      REVIEW OF SYSTEMS:  A comprehensive review of systems was negative except for: Constitutional: positive for fatigue Respiratory: positive for dyspnea on exertion   PHYSICAL EXAMINATION: General appearance: alert, cooperative, appears stated age, fatigued, and no distress Head: Normocephalic, without obvious abnormality, atraumatic Neck: no adenopathy, no JVD, supple, symmetrical, trachea  midline, and thyroid not enlarged, symmetric, no tenderness/mass/nodules Lymph nodes: Cervical, supraclavicular, and axillary nodes normal. Resp: clear to auscultation bilaterally Back: symmetric, no curvature. ROM normal. No CVA tenderness. Cardio: regular rate and rhythm, S1, S2 normal, no murmur, click, rub or gallop GI: soft, non-tender; bowel sounds normal; no masses,  no organomegaly Extremities: extremities normal, atraumatic, no cyanosis or edema  ECOG PERFORMANCE STATUS: 1 - Symptomatic but completely ambulatory  There were no vitals taken for this visit.  LABORATORY DATA: Lab Results  Component Value Date   WBC 6.1 02/23/2022   HGB 11.0 (L) 02/23/2022   HCT 33.6 (L) 02/23/2022   MCV 88.2 02/23/2022   PLT 196 02/23/2022      Chemistry      Component Value Date/Time   NA 141 02/23/2022 1045   NA 140 03/04/2021 1516   K 3.3 (L) 02/23/2022 1045   CL 106 02/23/2022 1045   CO2 27 02/23/2022 1045   BUN 19 02/23/2022 1045   BUN 21 03/04/2021 1516   CREATININE 1.18 (H) 02/23/2022 1045      Component Value Date/Time   CALCIUM 9.8 02/23/2022 1045   ALKPHOS 68 02/23/2022 1045   AST 47 (H) 02/23/2022 1045   ALT 60 (H) 02/23/2022 1045   BILITOT 0.3 02/23/2022 1045       RADIOGRAPHIC STUDIES: No results found.  ASSESSMENT AND PLAN: This is a very pleasant 71 years old never smoker African-American female recently with a stage IV non-small cell lung cancer, adenocarcinoma with positive EGFR mutation with deletion in exon 19. The patient was started on treatment with Tagrisso 80 mg p.o. daily status post 35 months of treatment. She has been tolerating this treatment well with no concerning adverse effects except for intermittent diarrhea. She had repeat CT scan of the chest, abdomen pelvis performed recently.  I personally and independently reviewed the scans and discussed the results with the patient and her boyfriend today. Unfortunately the CT scan showed interval  progression of the right apical lung mass in addition to progression of mediastinal lymphadenopathy concerning for worsening of her disease. She has no actionable resistant mutation on the molecular studies performed by [facility]. The patient continued her current treatment with Tagrisso and tolerating it fairly well. 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 Sondra Come  completed January 05, 2021. The patient had significant opacities in her lung that was initially thought to be secondary to radiation treatment versus Tagrisso induced pneumonitis versus lymphangitic spread of the tumor.  She was treated with high-dose taper regiment of prednisone Repeat imaging studies after the palliative radiotherapy showed evidence for disease progression. Her molecular studies by [facility] recently showed no new resistant mutation.  After discussion of her treatment options including palliative care and hospice referral versus palliative systemic chemotherapy the patient was interested in proceeding palliative systemic chemotherapy.  She started  palliative systemic chemotherapy with carboplatin for AUC of 5 and Alimta 500 Mg/M2 every 3 weeks.  Status post 16 cycles.  Starting from cycle #7 the patient is on treatment with single agent Alimta every 3 weeks. I did not add a Avastin to her treatment because of the recent stroke.  She also continued her treatment with Tagrisso at the same time.  She has been tolerating her treatment with single agent Alimta 400 Mg/M2 fairly well. I recommended for her to proceed with cycle #17 today as planned. I will see her back for follow-up visit in 3 weeks for evaluation before the next cycle of her treatment. For the anemia, she will continue with the oral iron tablets. She was advised to call immediately if she has any other concerning symptoms in the interval.  The  patient voices understanding of current disease status and treatment options and is in agreement with the current care plan.  All questions were answered. The patient knows to call the clinic with any problems, questions or concerns. We can certainly see the patient much sooner if necessary.  he total time spent in the appointment was 20 minutes.  Disclaimer: This note was dictated with voice recognition software. Similar sounding words can inadvertently be transcribed and may not be corrected upon review.

## 2022-02-23 NOTE — Progress Notes (Signed)
[facility]  [phone] [phone]   Name: Allison [name] Date: 02/23/2022 MRN: [id number]  DOB: Feb 13, 1951  Patient Care Team: [name], MD as PCP - General (Internal Medicine) Donato Heinz, MD as PCP - Cardiology (Cardiology) [name], MD as Consulting Physician (Radiation Oncology) Pickenpack-Cousar, Carlena Sax, NP as Nurse Practitioner (Nurse Practitioner) Valrie Hart, RN as Oncology Nurse Navigator (Oncology) Curt Bears, MD as Consulting Physician (Oncology) Gayland Curry, DO (Geriatric Medicine)    INTERVAL HISTORY: Allison [name] is a 71 y.o. female with stage IV non-small cell lung cancer (12/2017), hypertension, CVA, CAD, DVT/PE (on Lovenox), and GERD.  Palliative ask to see for symptom management and goals of care.  SOCIAL HISTORY:     reports that she has never smoked. She has never used smokeless tobacco. She reports that she does not currently use alcohol. She reports that she does not use drugs.  ADVANCE DIRECTIVES:  None on file   CODE STATUS: DNR  PAST MEDICAL HISTORY: Past Medical History:  Diagnosis Date   Anemia    Anxiety    Arthritis    Asthma    exercise induced   Depression    PMH   Dyspnea    GERD (gastroesophageal reflux disease)    Glaucoma    History of radiation therapy 01/05/2021   IMRT right lung  11/24/2020-01/05/2021  Dr [name]   Hypertension    lung ca dx'd 11/2017   right   Malignant pleural effusion    right   Nuclear sclerotic cataract of right eye 02/28/2021   Dr. Warden Fillers, cataract surgery February 2023   PONV (postoperative nausea and vomiting)    Pre-diabetes    Raynaud's disease    Raynaud's disease    Stroke ([place]) 01/2021   balance off, some express aphasia, weakness    ALLERGIES:  is allergic to hydrocodone-acetaminophen, penicillins, vicodin [hydrocodone-acetaminophen], and other.  MEDICATIONS:  Current  Outpatient Medications  Medication Sig Dispense Refill   acetaminophen (TYLENOL) 500 MG tablet Take 1,000 mg by mouth every 6 (six) hours as needed (pain).     ALPRAZolam (XANAX) 0.25 MG tablet Take 1 tablet (0.25 mg total) by mouth 2 (two) times daily as needed for anxiety. May take 2 tablets (0.5 mg total) at bedtime. 60 tablet 0   aspirin 81 MG chewable tablet Chew 1 tablet (81 mg total) by mouth daily.     brompheniramine-pseudoephedrine-DM 30-2-10 MG/5ML syrup Take 5 mLs by mouth 3 (three) times daily as needed. 120 mL 0   carvedilol (COREG) 12.5 MG tablet TAKE 1 TABLET BY MOUTH 2 TIMES DAILY. 180 tablet 1   cetirizine (ZYRTEC) 5 MG tablet Take 5 mg by mouth daily.     dexamethasone (DECADRON) 4 MG tablet Take 1 tablet twice a day the day before, the day of, and the day after chemotherapy 40 tablet 2   dorzolamide-timolol (COSOPT) 22.3-6.8 MG/ML ophthalmic solution Place 1 drop into both eyes 2 (two) times daily.     dronabinol (MARINOL) 2.5 MG capsule Take 1 capsule (2.5 mg total) by mouth 2 (two) times daily as needed (decreased appetite). 60 capsule 0   enoxaparin (LOVENOX) 60 MG/0.6ML injection Inject 0.6 mLs (60 mg total) into the skin every 12 (twelve) hours. (Patient taking differently: Inject 80 mg into the skin daily. 80 mg) 60 mL 1   esomeprazole (NEXIUM) 20 MG capsule Take 1 capsule (20 mg total) by mouth in the  morning and at bedtime. 60 capsule 2   FLUoxetine (PROZAC) 20 MG capsule Take 1 capsule (20 mg total) by mouth daily. 90 capsule 3   folic acid (FOLVITE) 1 MG tablet TAKE 1 TABLET BY MOUTH EVERY DAY 90 tablet 1   hyoscyamine (LEVSIN SL) 0.125 MG SL tablet Place 2 tablets (0.25 mg total) under the tongue every 6 (six) hours as needed for cramping. Take 1-2 tablets under the tongue every 6 hours as needed for cramping 30 tablet 0   ipratropium-albuterol (DUONEB) 0.5-2.5 (3) MG/3ML SOLN Take 3 mLs by nebulization every 6 (six) hours as needed (Asthma).     isosorbide mononitrate  (IMDUR) 30 MG 24 hr tablet Take 30 mg by mouth daily.     latanoprost (XALATAN) 0.005 % ophthalmic solution Place 1 drop into both eyes at bedtime.      ondansetron (ZOFRAN-ODT) 4 MG disintegrating tablet Take 1 tablet (4 mg total) by mouth every 8 (eight) hours as needed for nausea or vomiting. 30 tablet 1   osimertinib mesylate (TAGRISSO) 80 MG tablet Take 1 tablet (80 mg total) by mouth daily. 30 tablet 3   potassium chloride SA (KLOR-CON M) 20 MEQ tablet Take 1 tablet (20 mEq total) by mouth daily. 7 tablet 0   REPATHA SURECLICK 073 MG/ML SOAJ INJECT 140 MG INTO THE SKIN EVERY 14 (FOURTEEN) DAYS. 6 mL 3   No current facility-administered medications for this visit.   Facility-Administered Medications Ordered in Other Visits  Medication Dose Route Frequency Provider Last Rate Last Admin   sodium chloride flush (NS) 0.9 % injection 10 mL  10 mL Intracatheter PRN Curt Bears, MD   10 mL at 02/23/22 1214    VITAL SIGNS: BP (!) 168/74 (BP Location: Left Arm, Patient Position: Sitting) Comment: Nurse was notify  Pulse 72   Temp 98.2 F (36.8 C) (Oral)   Resp 15   Ht 5\' 4"  (1.626 m)   Wt 119 lb 6.4 oz (54.2 kg)   LMP  (LMP Unknown)   SpO2 100%   BMI 20.49 kg/m  Filed Weights   02/23/22 1101  Weight: 119 lb 6.4 oz (54.2 kg)    Estimated body mass index is 20.49 kg/m as calculated from the following:   Height as of this encounter: 5\' 4"  (1.626 m).   Weight as of this encounter: 119 lb 6.4 oz (54.2 kg).   PERFORMANCE STATUS (ECOG) : 1 - Symptomatic but completely ambulatory   Physical Exam General: NAD, sambulatory  Cardiovascular: RRR Pulmonary: normal breathing pattern  Extremities: no edema, no joint deformities Neurological: AAO x3, mood appropriate   IMPRESSION:  Allison [name] presents to clinic today for follow-up. Her significant other is with her. No acute distress noted. Ambulatory with cane. Shares her appreciation of how well she is feeling. Activity level  much improved. She is engaging in silver sneakers program for physical work-out.   Fatigue  Resolved. Reports energy and activity level is great. She is able to to do many of the things she enjoys.    Anxiety Anxiety is improving. She is able to focus on relaxation techniques, focused breathing, and guided imagery. She is appreciative that her anxiety levels are decreasing. Able to sleep better throughout the night. Is taking the xanax at bedtime when needed. She is also followed by home palliative with [name]. Will continue to take things day by day and practice relaxation.  Her weight is remaining stable at 119lbs.  PLAN: Symptoms are well controlled. Appetite is  much improved. She is being followed by the dietician.  Nexium twice daily for indigestion. Tolerating well.  Anxiety well controlled. Occasional use of Xanax at bedtime.   I will plan to see back in 6-8 weeks in collaboration with her other oncology appointments.    Patient expressed understanding and was in agreement with this plan. She also understands that She can call the clinic at any time with any questions, concerns, or complaints.          Any controlled substances utilized were prescribed in the context of palliative care. PDMP has been reviewed.   Time Total: 35 min  Visit consisted of counseling and education dealing with the complex and emotionally intense issues of symptom management and palliative care in the setting of serious and potentially life-threatening illness.Greater than 50%  of this time was spent counseling and coordinating care related to the above assessment and plan.  Alda Lea, AGPCNP-BC  Palliative Medicine Team/[facility]

## 2022-02-24 ENCOUNTER — Encounter: Payer: Self-pay | Admitting: General Practice

## 2022-02-24 NOTE — Progress Notes (Signed)
[facility] Note  Missed Amorah in infusion yesterday, so followed up by phone today. She was just preparing for a Zoom support call, so we plan to follow up in [place] next week.   [place], [place], [facility] Pager [phone] Voicemail [phone]

## 2022-03-02 ENCOUNTER — Telehealth: Payer: Self-pay

## 2022-03-02 NOTE — Telephone Encounter (Signed)
Allison [name], counseling intern, returned patient's telephone call regarding making an appointment for counseling. Counselor left a message. Counselor will try reaching the patient again early next week.   Allison [name]  Counseling Intern

## 2022-03-06 ENCOUNTER — Telehealth: Payer: Self-pay

## 2022-03-06 NOTE — Telephone Encounter (Signed)
Allison [name], counseling intern, called patient to schedule counseling session.   Session scheduled for Thursday, September 21st at 10 am via [place]. Counselor will send Zoom link via email.   Allison [name]  Counseling Intern

## 2022-03-09 ENCOUNTER — Other Ambulatory Visit: Payer: Medicare HMO | Admitting: Internal Medicine

## 2022-03-09 ENCOUNTER — Encounter: Payer: Self-pay | Admitting: Internal Medicine

## 2022-03-09 VITALS — BP 138/76 | HR 76 | Temp 97.7°F

## 2022-03-09 DIAGNOSIS — C3491 Malignant neoplasm of unspecified part of right bronchus or lung: Secondary | ICD-10-CM

## 2022-03-09 DIAGNOSIS — F411 Generalized anxiety disorder: Secondary | ICD-10-CM

## 2022-03-09 DIAGNOSIS — Z515 Encounter for palliative care: Secondary | ICD-10-CM

## 2022-03-09 DIAGNOSIS — K5901 Slow transit constipation: Secondary | ICD-10-CM

## 2022-03-09 DIAGNOSIS — R945 Abnormal results of liver function studies: Secondary | ICD-10-CM

## 2022-03-09 DIAGNOSIS — L299 Pruritus, unspecified: Secondary | ICD-10-CM

## 2022-03-09 MED ORDER — DOCUSATE SODIUM 100 MG PO CAPS: 100.0000 mg | ORAL_CAPSULE | ORAL | 5 refills | Status: DC

## 2022-03-09 NOTE — Progress Notes (Signed)
Lysle Morales, counseling intern, and patient met for the patient's first counseling session.   The patient's presenting concern is their anxiety. The patient expressed their anxiety shows up in labored speech and shaky hands.   Patient will take voice notes this week when their anxiety is activated. Patient will take note of what was happening mentally and physically during the 30 minutes beforehand. Patient and counselor are looking for patterns in the patient's anxiety.   Patient scheduled their next appointment for Thursday, October 5th at [place] via [place].   Lysle Morales Counseling Intern

## 2022-03-09 NOTE — Progress Notes (Signed)
[facility] Palliative Care Follow-Up Visit Telephone: [phone]  Fax: [phone]   Date of encounter: 03/09/22 10:44 PM PATIENT NAME: Allison [name] [place] [id number]   [phone] (home)  DOB: Nov 06, 1950 MRN: [id number] PRIMARY CARE PROVIDER:    [name], MD,  [address] [place] [address] [phone]  REFERRING PROVIDER:   [name], [place],   [address] [phone]  RESPONSIBLE PARTY:    Contact Information     Name Relation Home Work Road Runner "[place]" Denman George [phone]  [phone]   Streicher,[name] Daughter [phone]  [phone]   [name] Daughter [phone]  [phone]   Vuncannon,[name] Daughter [phone]  [phone]        I met face to face with patient and family in her home. Palliative Care was asked to follow this patient by consultation request of  [name], MD to address advance care planning and complex medical decision making. This is follow-up visit.                                     ASSESSMENT AND PLAN / RECOMMENDATIONS:   Advance Care Planning/Goals of Care: Goals include to maximize quality of life and symptom management. Patient/health care surrogate gave his/her permission to discuss.Our advance care planning conversation included a discussion about:    The value and importance of advance care planning  Experiences with loved ones who have been seriously ill or have died  Exploration of personal, cultural or spiritual beliefs that might influence medical decisions  Exploration of goals of care in the event of a sudden injury or illness  Identification  of a healthcare agent  Review and updating or creation of an  advance directive document . Decision not to resuscitate or to de-escalate disease focused treatments due to poor prognosis. CODE STATUS:  DNR, MOST  on file  Symptom Management/Plan: 1. Slow transit constipation -encouraged regular use of colace, fluids, fruits, activity - docusate sodium (COLACE) 100 MG capsule; Take 1 capsule (100 mg total) by mouth every other day. (Patient not taking: Reported on 03/09/2022)  Dispense: 15 capsule; Refill: 5  2. Pruritus -I'm a bit concerned about this b/c apparently reducing chemo did not keep transaminases down on last labs and she's itchy  3. Abnormal liver function -hopefully not due to mets, keep regular f/u with oncology  4. Adenocarcinoma of right lung, stage 4 (HCC) -continues chemo every 3 wks, no recent progression clearly identified and seems to have found a "new normal" for her  5. Generalized anxiety disorder -cont prozac, counseling, medication and she plans on hypnotism  6. Palliative care by specialist -reviewed goals which have not recently changed for her -MOST on file    Follow up Palliative Care Visit: Palliative care will continue to follow for complex medical decision making, advance care planning, and clarification of goals. Return 05/04/2022 and prn.   This visit was coded based on medical decision making (MDM).  PPS: 50%  HOSPICE ELIGIBILITY/DIAGNOSIS: TBD  Chief Complaint: Follow-up palliative visit  HISTORY OF PRESENT ILLNESS:  Allison [name] is a 70 y.o. year old female  with metastatic lung cancer being followed by palliative care.  She reports continuing her chemo every 3 wks.  Her last hgb was 11.  She is tired all of the time.  She is seeing a Social worker  intern at [facility] for her anxiety and wants to see a hypnotist.    She is having itching at hs for 2-3 wks now.  She tried changing detergent to no improvement.  In July, her living imaging was negative for any mets there.    She continues on her 46m prozac.  Her mood is more positive on it.  She gets frustrated and shakes.  Does meditation.  She's also had constipation--sometimes she'll be regular, but  sometimes 3-5 days b/w bms.  She's resistant to taking regular medication for this and has tried to have enough fruit.  She gets cramps and takes the hycosamine with some help there.  Is drinking 2-3 water per day, ginger tea, likes cantelope.  Also on iron.  She's getting "poop balls".   She had a gyn check last week and her mammogram.  Appetite has been a little better most days, but had lost 8 lbs when down  She's doing 15 mins spliver sneakers 1-2 x per day.    She is taking her carvedilol and amlodipine faithfully but not her asa or hctz and has not discussed with cardio.    History obtained from review of EMR, discussion with primary team, and interview with family, facility staff/caregiver and/or [name]  I reviewed available labs, medications, imaging, studies and related documents from the EMR.  Records reviewed and summarized above.   ROS Review of Systemssee hpi  Physical Exam: Vitals:   03/09/22 1228  BP: 138/76  Pulse: 76  Temp: 97.7 F (36.5 C)  SpO2: 97%   There is no height or weight on file to calculate BMI. Wt Readings from Last 500 Encounters:  03/16/22 118 lb 12.8 oz (53.9 kg)  02/23/22 119 lb 6.4 oz (54.2 kg)  01/31/22 117 lb 9.6 oz (53.3 kg)  01/17/22 117 lb (53.1 kg)  01/12/22 119 lb 6.4 oz (54.2 kg)  12/22/21 117 lb 2 oz (53.1 kg)  12/01/21 118 lb 8 oz (53.8 kg)  11/10/21 118 lb 6 oz (53.7 kg)  10/21/21 116 lb 8 oz (52.8 kg)  10/19/21 116 lb (52.6 kg)  10/07/21 116 lb 0.3 oz (52.6 kg)  09/29/21 119 lb (54 kg)  09/12/21 115 lb 9.6 oz (52.4 kg)  09/08/21 115 lb 3.2 oz (52.3 kg)  08/18/21 113 lb 3.2 oz (51.3 kg)  07/28/21 115 lb 11.2 oz (52.5 kg)  07/27/21 114 lb 3.2 oz (51.8 kg)  07/20/21 116 lb (52.6 kg)  07/07/21 116 lb (52.6 kg)  06/21/21 116 lb 3.2 oz (52.7 kg)  06/17/21 117 lb 12.8 oz (53.4 kg)  06/02/21 117 lb 2 oz (53.1 kg)  05/26/21 118 lb 9.6 oz (53.8 kg)  05/05/21 117 lb 9.6 oz (53.3 kg)  05/05/21 117 lb 3.2 oz (53.2 kg)   04/28/21 115 lb 9.6 oz (52.4 kg)  04/26/21 117 lb 1 oz (53.1 kg)  04/12/21 118 lb (53.5 kg)  04/07/21 118 lb 6.4 oz (53.7 kg)  03/29/21 122 lb (55.3 kg)  03/24/21 120 lb 3.2 oz (54.5 kg)  03/18/21 124 lb 12.8 oz (56.6 kg)  03/17/21 122 lb 12.8 oz (55.7 kg)  03/16/21 122 lb 7.2 oz (55.5 kg)  03/14/21 123 lb 12.8 oz (56.2 kg)  03/10/21 125 lb 1 oz (56.7 kg)  03/04/21 125 lb (56.7 kg)  02/23/21 130 lb 3.2 oz (59.1 kg)  02/13/21 129 lb 10.1 oz (58.8 kg)  02/07/21 127 lb (57.6 kg)  02/07/21 127 lb 1.6 oz (57.7 kg)  02/02/21  129 lb 1.6 oz (58.6 kg)  12/27/20 129 lb 6 oz (58.7 kg)  11/17/20 131 lb (59.4 kg)  11/11/20 131 lb (59.4 kg)  10/27/20 131 lb 4.8 oz (59.6 kg)  10/13/20 132 lb 6.4 oz (60.1 kg)  09/13/20 131 lb (59.4 kg)  08/16/20 135 lb 9.6 oz (61.5 kg)  06/30/20 135 lb (61.2 kg)  06/15/20 134 lb 3.2 oz (60.9 kg)  05/18/20 133 lb 9.6 oz (60.6 kg)  05/04/20 132 lb 8 oz (60.1 kg)  03/19/20 132 lb (59.9 kg)  03/15/20 134 lb (60.8 kg)  03/09/20 132 lb 9.6 oz (60.1 kg)  02/11/20 133 lb (60.3 kg)  01/13/20 130 lb 14.4 oz (59.4 kg)  12/17/19 130 lb (59 kg)  12/02/19 132 lb 4.8 oz (60 kg)  12/02/19 129 lb 12.8 oz (58.9 kg)  11/12/19 133 lb 6.4 oz (60.5 kg)  10/21/19 132 lb 4.4 oz (60 kg)  09/30/19 133 lb 3.2 oz (60.4 kg)  09/17/19 131 lb (59.4 kg)  09/01/19 131 lb 12.8 oz (59.8 kg)  08/20/19 132 lb (59.9 kg)  08/19/19 133 lb (60.3 kg)  07/22/19 133 lb 2.5 oz (60.4 kg)  07/16/19 129 lb (58.5 kg)  07/03/19 132 lb 9.6 oz (60.1 kg)  06/17/19 132 lb 12.8 oz (60.2 kg)  05/05/19 131 lb 4.8 oz (59.6 kg)  04/09/19 129 lb (58.5 kg)  03/24/19 132 lb 3.2 oz (60 kg)  02/10/19 133 lb 4.8 oz (60.5 kg)  01/09/19 133 lb 1.6 oz (60.4 kg)  12/26/18 130 lb (59 kg)  11/22/18 131 lb (59.4 kg)  11/20/18 132 lb 4.8 oz (60 kg)  11/07/18 129 lb (58.5 kg)  10/23/18 129 lb (58.5 kg)  09/03/18 131 lb 12.8 oz (59.8 kg)  08/21/18 132 lb (59.9 kg)  08/05/18 132 lb 3.2 oz (60 kg)  07/24/18 128  lb (58.1 kg)  07/02/18 131 lb 8 oz (59.6 kg)  06/24/18 130 lb 9.6 oz (59.2 kg)  06/03/18 131 lb 1.6 oz (59.5 kg)  05/22/18 133 lb (60.3 kg)  04/30/18 133 lb 12.8 oz (60.7 kg)  04/17/18 131 lb (59.4 kg)  04/02/18 131 lb 6.4 oz (59.6 kg)  03/13/18 130 lb (59 kg)  02/25/18 130 lb 9.6 oz (59.2 kg)  02/13/18 130 lb (59 kg)  02/12/18 130 lb 3.2 oz (59.1 kg)  01/22/18 130 lb 3.2 oz (59.1 kg)  01/16/18 126 lb (57.2 kg)  01/15/18 127 lb (57.6 kg)  01/15/18 126 lb 8 oz (57.4 kg)  01/03/18 128 lb 6.4 oz (58.2 kg)  01/01/18 126 lb 12.8 oz (57.5 kg)  12/27/17 126 lb 12.8 oz (57.5 kg)  12/18/17 127 lb 3.2 oz (57.7 kg)  04/25/15 155 lb (70.3 kg)  10/07/14 155 lb 12.8 oz (70.7 kg)  02/15/13 148 lb (67.1 kg)   Physical Exam HENT:     Head: Normocephalic and atraumatic.  Eyes:     Comments: glasses  Cardiovascular:     Rate and Rhythm: Normal rate and regular rhythm.  Pulmonary:     Effort: Pulmonary effort is normal.     Breath sounds: Normal breath sounds. No wheezing, rhonchi or rales.  Abdominal:     General: Bowel sounds are normal. There is no distension.     Palpations: Abdomen is soft. There is no mass.     Tenderness: There is no abdominal tenderness. There is no right CVA tenderness, left CVA tenderness, guarding or rebound.     Comments: No palpable hepatomegaly or tenderness  locally  Musculoskeletal:        General: Normal range of motion.     Right lower leg: Edema present.     Left lower leg: Edema present.     Comments: Mild edema right greater than left chronically  Skin:    General: Skin is warm and dry.  Neurological:     General: No focal deficit present.     Mental Status: She is alert and oriented to person, place, and time.  Psychiatric:        Mood and Affect: Mood normal.        Behavior: Behavior normal.     Comments: Slightly tremulous     CURRENT PROBLEM LIST:  Patient Active Problem List   Diagnosis Date Noted   Abnormal glucose level 01/18/2022    Anemia 10/07/2021   Neutropenia ([place]) 10/07/2021   Pseudophakia of right eye 09/22/2021   Port-A-Cath in place 07/07/2021   Protein-calorie malnutrition, severe 04/25/2021   Pneumothorax after biopsy 04/21/2021   Lung nodule 04/01/2021   Epiretinal membrane, right eye 02/28/2021   Nuclear sclerotic cataract of left eye 02/28/2021   Coronary artery disease of native artery of native heart with stable angina pectoris (HCC)    CVA (cerebral vascular accident) ([place]) 02/12/2021   Current use of long term anticoagulation 02/12/2021   Hypokalemia 02/12/2021   Leg DVT (deep venous thromboembolism), acute, bilateral ([place]) 02/08/2021   Aortic atherosclerosis ([place]) 02/08/2021   Respiratory failure ([place]) 02/08/2021   Postobstructive pneumonia 02/07/2021   Pulmonary embolism ([place]) 02/07/2021   Elevated troponin 02/07/2021   Encounter to establish care 02/02/2021   Shortness of breath 02/02/2021   Cough 02/02/2021   Statin myopathy 12/03/2020   Acute non-recurrent frontal sinusitis 11/18/2020   NSCLC with EGFR mutation ([place]) 06/30/2020   Hx of chest tube placement 02/11/2020   History of chest tube placement 11/12/2019   Abnormal liver function 08/20/2019   Gastroesophageal reflux disease without esophagitis 08/20/2019   Generalized anxiety disorder 08/20/2019   Glaucoma 08/20/2019   Hyperlipidemia 08/20/2019   Menopausal syndrome 08/20/2019   Raynaud's phenomenon 08/20/2019   Vitamin D deficiency 08/20/2019   Status post coronary artery stent placement    History of non-ST elevation myocardial infarction (NSTEMI) 06/12/2019   Hypertension 03/24/2019   Encounter for antineoplastic chemotherapy 01/22/2018   Adenocarcinoma of right lung, stage 4 ([place]) 01/04/2018   Goals of care, counseling/discussion 01/04/2018   Thyroid nodule 12/11/2017   Pleural effusion, right 12/11/2017   Heel pain 03/29/2015   Insomnia 04/20/2014    PAST MEDICAL HISTORY:  Active Ambulatory Problems    Diagnosis  Date Noted   Thyroid nodule 12/11/2017   Pleural effusion, right 12/11/2017   Adenocarcinoma of right lung, stage 4 ([place]) 01/04/2018   Goals of care, counseling/discussion 01/04/2018   Encounter for antineoplastic chemotherapy 01/22/2018   Hypertension 03/24/2019   History of non-ST elevation myocardial infarction (NSTEMI) 06/12/2019   Status post coronary artery stent placement    Abnormal liver function 08/20/2019   Gastroesophageal reflux disease without esophagitis 08/20/2019   Generalized anxiety disorder 08/20/2019   Glaucoma 08/20/2019   Hyperlipidemia 08/20/2019   Insomnia 04/20/2014   Menopausal syndrome 08/20/2019   Raynaud's phenomenon 08/20/2019   Vitamin D deficiency 08/20/2019   History of chest tube placement 11/12/2019   Hx of chest tube placement 02/11/2020   NSCLC with EGFR mutation ([place]) 06/30/2020   Acute non-recurrent frontal sinusitis 11/18/2020   Statin myopathy 12/03/2020   Encounter to establish care 02/02/2021  Shortness of breath 02/02/2021   Cough 02/02/2021   Postobstructive pneumonia 02/07/2021   Pulmonary embolism ([place]) 02/07/2021   Elevated troponin 02/07/2021   Leg DVT (deep venous thromboembolism), acute, bilateral ([place]) 02/08/2021   Aortic atherosclerosis ([place]) 02/08/2021   Respiratory failure ([place]) 02/08/2021   CVA (cerebral vascular accident) ([place]) 02/12/2021   Current use of long term anticoagulation 02/12/2021   Hypokalemia 02/12/2021   Coronary artery disease of native artery of native heart with stable angina pectoris ([place])    Epiretinal membrane, right eye 02/28/2021   Nuclear sclerotic cataract of left eye 02/28/2021   Lung nodule 04/01/2021   Pneumothorax after biopsy 04/21/2021   Protein-calorie malnutrition, severe 04/25/2021   Port-A-Cath in place 07/07/2021   Pseudophakia of right eye 09/22/2021   Anemia 10/07/2021   Neutropenia ([place]) 10/07/2021   Heel pain 03/29/2015   Abnormal glucose level 01/18/2022   Resolved  Ambulatory Problems    Diagnosis Date Noted   Mass of upper lobe of right lung 12/10/2016   Pulmonary nodules 12/11/2017   Hypokalemia    History of precordial chest pain 07/16/2019   Nuclear sclerotic cataract of right eye 02/28/2021   Overweight 01/18/2022   Past Medical History:  Diagnosis Date   Anxiety    Arthritis    Asthma    Depression    Dyspnea    GERD (gastroesophageal reflux disease)    History of radiation therapy 01/05/2021   lung ca dx'd 11/2017   Malignant pleural effusion    PONV (postoperative nausea and vomiting)    Pre-diabetes    Raynaud's disease    Raynaud's disease    Stroke ([place]) 01/2021    SOCIAL HX:  Social History   Tobacco Use   Smoking status: Never   Smokeless tobacco: Never  Substance Use Topics   Alcohol use: Not Currently    Comment: up to 3 drinks per week     ALLERGIES:  Allergies  Allergen Reactions   Hydrocodone-Acetaminophen Other (See Comments)   Penicillins Other (See Comments)    [facility] PATIENT HAS HAD A PCN REACTION WITH IMMEDIATE RASH, FACIAL/TONGUE/THROAT SWELLING, SOB, OR LIGHTHEADEDNESS WITH HYPOTENSION:  #  #  YES  #  # Has patient had a PCN reaction causing severe rash involving mucus membranes or skin necrosis: No Has patient had a PCN reaction that required hospitalization: No Has patient had a PCN reaction occurring within the last 10 years: No  Other reaction(s): other   Vicodin [Hydrocodone-Acetaminophen] Other (See Comments)    Sped up heart and breathing   Other Other (See Comments)    Glaucoma eye drop - turned eyes dark      PERTINENT MEDICATIONS:  Outpatient Encounter Medications as of 03/09/2022  Medication Sig   acetaminophen (TYLENOL) 500 MG tablet Take 1,000 mg by mouth every 6 (six) hours as needed (pain).   ALPRAZolam (XANAX) 0.25 MG tablet Take 1 tablet (0.25 mg total) by mouth 2 (two) times daily as needed for anxiety. May take 2 tablets (0.5 mg total) at bedtime.   carvedilol (COREG) 12.5  MG tablet TAKE 1 TABLET BY MOUTH 2 TIMES DAILY.   dexamethasone (DECADRON) 4 MG tablet Take 1 tablet twice a day the day before, the day of, and the day after chemotherapy   dorzolamide-timolol (COSOPT) 22.3-6.8 MG/ML ophthalmic solution Place 1 drop into both eyes 2 (two) times daily.   enoxaparin (LOVENOX) 60 MG/0.6ML injection Inject 0.6 mLs (60 mg total) into the skin every 12 (twelve) hours. (Patient taking  differently: Inject 80 mg into the skin daily. 80 mg)   ezetimibe (ZETIA) 10 MG tablet Take 10 mg by mouth daily.   FLUoxetine (PROZAC) 20 MG capsule Take 1 capsule (20 mg total) by mouth daily.   folic acid (FOLVITE) 1 MG tablet TAKE 1 TABLET BY MOUTH EVERY DAY   ipratropium-albuterol (DUONEB) 0.5-2.5 (3) MG/3ML SOLN Take 3 mLs by nebulization every 6 (six) hours as needed (Asthma).   isosorbide mononitrate (IMDUR) 30 MG 24 hr tablet Take 30 mg by mouth daily.   latanoprost (XALATAN) 0.005 % ophthalmic solution Place 1 drop into both eyes at bedtime.    ondansetron (ZOFRAN-ODT) 4 MG disintegrating tablet Take 1 tablet (4 mg total) by mouth every 8 (eight) hours as needed for nausea or vomiting.   osimertinib mesylate (TAGRISSO) 80 MG tablet Take 1 tablet (80 mg total) by mouth daily.   REPATHA SURECLICK 509 MG/ML SOAJ INJECT 140 MG INTO THE SKIN EVERY 14 (FOURTEEN) DAYS.   valACYclovir (VALTREX) 500 MG tablet Take 1 tablet twice a day by oral route for 5 days.   [DISCONTINUED] hyoscyamine (LEVSIN SL) 0.125 MG SL tablet Place 2 tablets (0.25 mg total) under the tongue every 6 (six) hours as needed for cramping. Take 1-2 tablets under the tongue every 6 hours as needed for cramping   [DISCONTINUED] potassium chloride SA (KLOR-CON M) 20 MEQ tablet Take 1 tablet (20 mEq total) by mouth daily.   aspirin 81 MG chewable tablet Chew 1 tablet (81 mg total) by mouth daily. (Patient not taking: Reported on 03/09/2022)   brompheniramine-pseudoephedrine-DM 30-2-10 MG/5ML syrup Take 5 mLs by mouth 3  (three) times daily as needed. (Patient not taking: Reported on 03/09/2022)   cetirizine (ZYRTEC) 5 MG tablet Take 5 mg by mouth daily. (Patient not taking: Reported on 03/09/2022)   docusate sodium (COLACE) 100 MG capsule Take 1 capsule (100 mg total) by mouth every other day. (Patient not taking: Reported on 03/09/2022)   dronabinol (MARINOL) 2.5 MG capsule Take 1 capsule (2.5 mg total) by mouth 2 (two) times daily as needed (decreased appetite). (Patient not taking: Reported on 03/09/2022)   esomeprazole (NEXIUM) 20 MG capsule Take 1 capsule (20 mg total) by mouth in the morning and at bedtime. (Patient not taking: Reported on 03/09/2022)   No facility-administered encounter medications on file as of 03/09/2022.    Thank you for the opportunity to participate in the care of [name].  The palliative care team will continue to follow. Please call our office at [phone] if we can be of additional assistance.   [name], DO  COVID-19 PATIENT SCREENING TOOL Asked and negative response unless otherwise noted:  Have you had symptoms of covid, tested positive or been in contact with someone with symptoms/positive test in the past 5-10 days? No

## 2022-03-14 NOTE — Progress Notes (Unsigned)
[facility] OFFICE PROGRESS NOTE  [name], [place] [address]  DIAGNOSIS: Stage IV (T2 a,N2, M1a) non-small cell lung cancer, adenocarcinoma diagnosed in July 2019 and presented with right upper lobe lung mass in addition to mediastinal lymphadenopathy as well as bilateral pulmonary nodules and malignant right pleural effusion.   Biomarker Findings Microsatellite status - Cannot Be Determined Tumor Mutational Burden - Cannot Be Determined Genomic Findings For a complete list of the genes assayed, please refer to the Appendix. EGFR exon 19 deletion ([id number]>T) TP53 Y220C 7 Disease relevant genes with no reportable alterations: KRAS, ALK, BRAF, MET, RET, ERBB2, ROS1   PRIOR THERAPY: 1) Status post right Pleurx catheter placement by Dr. Prescott Gum for drainage of malignant right pleural effusion. 2) palliative radiotherapy to the enlarging right upper lobe lung mass and mediastinum under the care of Dr. Sondra Come expected to be completed on January 05, 2021. 3) Tagrisso 80 mg p.o. daily.  First dose was given on 01/29/2018.  Status post 39 months of treatment.    CURRENT THERAPY: Systemic chemotherapy with carboplatin for AUC of 5 and Alimta 500 Mg/M2 every 3 weeks.  First dose 03/17/2021.  The patient will also continue her current treatment with Tagrisso 80 mg p.o. daily.  She is status post 17 cycles. Starting from cycle #7, she will be on Alimta only 500 mg/m2.  Alimta was eventually reduced to 400 mg per metered squared due to renal insufficiency  INTERVAL HISTORY: Allison [name] 70 y.o. female returns to the clinic today for a follow up visit accompanied by her boyfriend. She is on a reduced dose of Alimta due to some kidney dysfunction. Since last being seen...she denies any changes in her health.  She denies any fever or chills. She has some stable baseline night sweats. She is trying to increase her snacking and eating small  frequent meals.  She is no longer taking Ensure or Marinol. She denies any hemoptysis or chest pain.  Regarding her breathing, she has occasional chest tightness for which she takes Tylenol.  She also uses of vibrating vest to help break up mucus secretions in her lung which is helpful for her.  She reports baseline dyspnea on exertion which she thinks may be worse with the worsening heat/humidity outside.  She follows closely with Dr. Valeta Harms from [facility] pulmonary medicine.  She continues to have a cough. She denies any nausea, vomiting, unusual constipation, or diarrhea. She denies any headache or visual changes.  She is here today for evaluation and repeat blood work before starting cycle #18  MEDICAL HISTORY: Past Medical History:  Diagnosis Date   Anemia    Anxiety    Arthritis    Asthma    exercise induced   Depression    PMH   Dyspnea    GERD (gastroesophageal reflux disease)    Glaucoma    History of radiation therapy 01/05/2021   IMRT right lung  11/24/2020-01/05/2021  Dr [name]   Hypertension    lung ca dx'd 11/2017   right   Malignant pleural effusion    right   Nuclear sclerotic cataract of right eye 02/28/2021   Dr. Warden Fillers, cataract surgery February 2023   PONV (postoperative nausea and vomiting)    Pre-diabetes    Raynaud's disease    Raynaud's disease    Stroke ([place]) 01/2021   balance off, some express aphasia, weakness    ALLERGIES:  is allergic to hydrocodone-acetaminophen, penicillins,  vicodin [hydrocodone-acetaminophen], and other.  MEDICATIONS:  Current Outpatient Medications  Medication Sig Dispense Refill   acetaminophen (TYLENOL) 500 MG tablet Take 1,000 mg by mouth every 6 (six) hours as needed (pain).     ALPRAZolam (XANAX) 0.25 MG tablet Take 1 tablet (0.25 mg total) by mouth 2 (two) times daily as needed for anxiety. May take 2 tablets (0.5 mg total) at bedtime. 60 tablet 0   aspirin 81 MG chewable tablet Chew 1 tablet (81 mg total) by  mouth daily. (Patient not taking: Reported on 03/09/2022)     brompheniramine-pseudoephedrine-DM 30-2-10 MG/5ML syrup Take 5 mLs by mouth 3 (three) times daily as needed. (Patient not taking: Reported on 03/09/2022) 120 mL 0   carvedilol (COREG) 12.5 MG tablet TAKE 1 TABLET BY MOUTH 2 TIMES DAILY. 180 tablet 1   cetirizine (ZYRTEC) 5 MG tablet Take 5 mg by mouth daily. (Patient not taking: Reported on 03/09/2022)     dexamethasone (DECADRON) 4 MG tablet Take 1 tablet twice a day the day before, the day of, and the day after chemotherapy 40 tablet 2   docusate sodium (COLACE) 100 MG capsule Take 1 capsule (100 mg total) by mouth every other day. (Patient not taking: Reported on 03/09/2022) 15 capsule 5   dorzolamide-timolol (COSOPT) 22.3-6.8 MG/ML ophthalmic solution Place 1 drop into both eyes 2 (two) times daily.     dronabinol (MARINOL) 2.5 MG capsule Take 1 capsule (2.5 mg total) by mouth 2 (two) times daily as needed (decreased appetite). (Patient not taking: Reported on 03/09/2022) 60 capsule 0   enoxaparin (LOVENOX) 60 MG/0.6ML injection Inject 0.6 mLs (60 mg total) into the skin every 12 (twelve) hours. (Patient taking differently: Inject 80 mg into the skin daily. 80 mg) 60 mL 1   esomeprazole (NEXIUM) 20 MG capsule Take 1 capsule (20 mg total) by mouth in the morning and at bedtime. (Patient not taking: Reported on 03/09/2022) 60 capsule 2   ezetimibe (ZETIA) 10 MG tablet Take 10 mg by mouth daily.     FLUoxetine (PROZAC) 20 MG capsule Take 1 capsule (20 mg total) by mouth daily. 90 capsule 3   folic acid (FOLVITE) 1 MG tablet TAKE 1 TABLET BY MOUTH EVERY DAY 90 tablet 1   hyoscyamine (LEVSIN SL) 0.125 MG SL tablet Place 2 tablets (0.25 mg total) under the tongue every 6 (six) hours as needed for cramping. Take 1-2 tablets under the tongue every 6 hours as needed for cramping 30 tablet 0   ipratropium-albuterol (DUONEB) 0.5-2.5 (3) MG/3ML SOLN Take 3 mLs by nebulization every 6 (six) hours as needed  (Asthma).     isosorbide mononitrate (IMDUR) 30 MG 24 hr tablet Take 30 mg by mouth daily.     latanoprost (XALATAN) 0.005 % ophthalmic solution Place 1 drop into both eyes at bedtime.      ondansetron (ZOFRAN-ODT) 4 MG disintegrating tablet Take 1 tablet (4 mg total) by mouth every 8 (eight) hours as needed for nausea or vomiting. 30 tablet 1   osimertinib mesylate (TAGRISSO) 80 MG tablet Take 1 tablet (80 mg total) by mouth daily. 30 tablet 3   potassium chloride SA (KLOR-CON M) 20 MEQ tablet Take 1 tablet (20 mEq total) by mouth daily. 7 tablet 0   REPATHA SURECLICK 440 MG/ML SOAJ INJECT 140 MG INTO THE SKIN EVERY 14 (FOURTEEN) DAYS. 6 mL 3   valACYclovir (VALTREX) 500 MG tablet Take 1 tablet twice a day by oral route for 5 days.  No current facility-administered medications for this visit.    SURGICAL HISTORY:  Past Surgical History:  Procedure Laterality Date   ABDOMINAL HYSTERECTOMY     partial   BRONCHIAL BIOPSY  04/21/2021   Procedure: BRONCHIAL BIOPSIES;  Surgeon: [name], DO;  Location: [facility];  Service: Pulmonary;;   BRONCHIAL BRUSHINGS  04/21/2021   Procedure: BRONCHIAL BRUSHINGS;  Surgeon: [name], DO;  Location: [facility];  Service: Pulmonary;;   BRONCHIAL NEEDLE ASPIRATION BIOPSY  04/21/2021   Procedure: BRONCHIAL NEEDLE ASPIRATION BIOPSIES;  Surgeon: [name], DO;  Location: [facility];  Service: Pulmonary;;   CHEST TUBE INSERTION Right 01/01/2018   Procedure: INSERTION PLEURAL DRAINAGE CATHETER;  Surgeon: Ivin Poot, MD;  Location[facility];  Service: Thoracic;  Laterality: Right;   CHEST TUBE INSERTION  04/21/2021   Procedure: CHEST TUBE INSERTION;  Surgeon: [name], DO;  Location: [facility];  Service: Pulmonary;;   COLONOSCOPY     CORONARY STENT INTERVENTION N/A 06/16/2019   Procedure: CORONARY STENT INTERVENTION;  Surgeon: Jettie Booze, MD;  Location: [facility];  Service: Cardiovascular;  Laterality:  N/A;   DILATION AND CURETTAGE OF UTERUS     EYE SURGERY     due to Glaucoma   IR IMAGING GUIDED PORT INSERTION  03/22/2021   IR PORT REPAIR CENTRAL VENOUS ACCESS DEVICE  04/15/2021   LEFT HEART CATH AND CORONARY ANGIOGRAPHY N/A 06/16/2019   Procedure: LEFT HEART CATH AND CORONARY ANGIOGRAPHY;  Surgeon: Jettie Booze, MD;  Location: [facility];  Service: Cardiovascular;  Laterality: N/A;   REMOVAL OF PLEURAL DRAINAGE CATHETER Right 11/07/2018   Procedure: REMOVAL OF PLEURAL DRAINAGE CATHETER;  Surgeon: Ivin Poot, MD;  Location: [facility];  Service: Thoracic;  Laterality: Right;   ROTATOR CUFF REPAIR     TUBAL LIGATION     VIDEO BRONCHOSCOPY WITH ENDOBRONCHIAL NAVIGATION Bilateral 04/21/2021   Procedure: VIDEO BRONCHOSCOPY WITH ENDOBRONCHIAL NAVIGATION;  Surgeon: [name], DO;  Location: [facility];  Service: Pulmonary;  Laterality: Bilateral;  ION   WISDOM TOOTH EXTRACTION      REVIEW OF SYSTEMS:   Review of Systems  Constitutional: Negative for appetite change, chills, fatigue, fever and unexpected weight change.  HENT:   Negative for mouth sores, nosebleeds, sore throat and trouble swallowing.   Eyes: Negative for eye problems and icterus.  Respiratory: Negative for cough, hemoptysis, shortness of breath and wheezing.   Cardiovascular: Negative for chest pain and leg swelling.  Gastrointestinal: Negative for abdominal pain, constipation, diarrhea, nausea and vomiting.  Genitourinary: Negative for bladder incontinence, difficulty urinating, dysuria, frequency and hematuria.   Musculoskeletal: Negative for back pain, gait problem, neck pain and neck stiffness.  Skin: Negative for itching and rash.  Neurological: Negative for dizziness, extremity weakness, gait problem, headaches, light-headedness and seizures.  Hematological: Negative for adenopathy. Does not bruise/bleed easily.  Psychiatric/Behavioral: Negative for confusion, depression and sleep disturbance.  The patient is not nervous/anxious.     PHYSICAL EXAMINATION:  There were no vitals taken for this visit.  ECOG PERFORMANCE STATUS: {CHL ONC ECOG [id number]  Physical Exam  Constitutional: Oriented to person, place, and time and well-developed, well-nourished, and in no distress. No distress.  HENT:  Head: Normocephalic and atraumatic.  Mouth/Throat: Oropharynx is clear and moist. No oropharyngeal exudate.  Eyes: Conjunctivae are normal. Right eye exhibits no discharge. Left eye exhibits no discharge. No scleral icterus.  Neck: Normal range of motion. Neck supple.  Cardiovascular:  Normal rate, regular rhythm, normal heart sounds and intact distal pulses.   Pulmonary/Chest: Effort normal and breath sounds normal. No respiratory distress. No wheezes. No rales.  Abdominal: Soft. Bowel sounds are normal. Exhibits no distension and no mass. There is no tenderness.  Musculoskeletal: Normal range of motion. Exhibits no edema.  Lymphadenopathy:    No cervical adenopathy.  Neurological: Alert and oriented to person, place, and time. Exhibits normal muscle tone. Gait normal. Coordination normal.  Skin: Skin is warm and dry. No rash noted. Not diaphoretic. No erythema. No pallor.  Psychiatric: Mood, memory and judgment normal.  Vitals reviewed.  LABORATORY DATA: Lab Results  Component Value Date   WBC 6.1 02/23/2022   HGB 11.0 (L) 02/23/2022   HCT 33.6 (L) 02/23/2022   MCV 88.2 02/23/2022   PLT 196 02/23/2022      Chemistry      Component Value Date/Time   NA 141 02/23/2022 1045   NA 140 03/04/2021 1516   K 3.3 (L) 02/23/2022 1045   CL 106 02/23/2022 1045   CO2 27 02/23/2022 1045   BUN 19 02/23/2022 1045   BUN 21 03/04/2021 1516   CREATININE 1.18 (H) 02/23/2022 1045      Component Value Date/Time   CALCIUM 9.8 02/23/2022 1045   ALKPHOS 68 02/23/2022 1045   AST 47 (H) 02/23/2022 1045   ALT 60 (H) 02/23/2022 1045   BILITOT 0.3 02/23/2022 1045       RADIOGRAPHIC  STUDIES:  No results found.   ASSESSMENT/PLAN:  This is a very pleasant 71 year old never smoker African-American female diagnosed with stage IV non-small cell lung cancer, adenocarcinoma.  She was positive for an EGFR mutation with deletion in exon 19.  She was diagnosed in July 2019 and presented with right upper lobe lung mass in addition to mediastinal lymphadenopathy as well as bilateral pulmonary nodules and malignant right pleural effusion.   The patient was started on treatment with Tagrisso 80 mg p.o. daily status post 39 months of treatment. Started on 01/29/2018.   In April 2022, she showed evidence of disease progression with interval progression of the right apical lung mass in addition to progression of mediastinal lymphadenopathy concerning for worsening of her disease. She had repeat Guardant 360 molecular studies which did not show evidence of new resistant mutations.  Therefore, she was referred to radiation oncology and completed palliative radiotherapy to the enlarging right upper lobe lung mass and mediastinum under the care of Dr. Sondra Come.  This was completed in July 2022.  Unfortunately, the patient recently was found to have evidence of disease progression.  Therefore, Dr. Julien Nordmann started the patient on systemic chemotherapy with carboplatin for AUC of 5 and Alimta 500 mg per metered squared.  She is status post 17 cycles and she tolerated it fairly well despite her ongoing issues with fatigue, chest tightness, cough, and weight loss.  Alimta was reduced to 400 mg per metered squared due to renal insufficiency.  She is not a good candidate for Avastin due to her recent CVA in August 2022.  She also is continuing to take Tagrisso as it is protective against progressive metastatic disease to the brain. Starting from cycle #7,  she started maintenance single agent alimta.   The patient underwent a repeat bronchoscopy and biopsy for repeat molecular testing.  The sample from the left  and right lung biopsy was negative for malignancy. This was performed in November 2022.    The patient saw Dr. Durenda Hurt from [facility] for second opinion in November 2022.  They discussed if she progresses on chemotherapy that there may be upcoming options. He discussed other treatment options which may be available in 2023 including patritumab deruxtcan and lazertinib/amivantmab. If these are not available, he wrote that they can determine if they can get the agents through an expanded access, single patient IND, or clinical trial if she has progression with chemotherapy.   Labs were reviewed. Recommend *** she proceed with cycle #18 today as scheduled.   We will see her back for follow-up visit in 3 weeks for evaluation and repeat blood work before starting cycle #19.  She will continue using her nebulizers and Mucinex for her cough and shortness of breath.  For her questions regarding her COPD management, she will reach out to Dr. Juline Patch office about his thoughts on Trelegy, which the patient inquired about after speaking to a friend.  Dr. Julien Nordmann encouraged the patient to continue to hydrate well and avoid nephrotoxic over-the-counter medicine such as Aleve or ibuprofen.   I sent the patient a prescription for Integra plus to see if this helps reduce her GI side effects from the iron supplement.   The patient was advised to call immediately if she has any concerning symptoms in the interval. The patient voices understanding of current disease status and treatment options and is in agreement with the current care plan. All questions were answered. The patient knows to call the clinic with any problems, questions or concerns. We can certainly see the patient much sooner if necessary               No orders of the defined types were placed in this encounter.    I spent {CHL ONC TIME VISIT - ZESPQ:[id number]} counseling the patient face to face. The total time spent in  the appointment was {CHL ONC TIME VISIT - FHLKT:[id number]}.  Shekinah Pitones L Zaki Gertsch, PA-C 03/14/22

## 2022-03-16 ENCOUNTER — Ambulatory Visit: Payer: Medicare HMO | Admitting: Internal Medicine

## 2022-03-16 ENCOUNTER — Ambulatory Visit: Payer: Medicare HMO

## 2022-03-16 ENCOUNTER — Other Ambulatory Visit: Payer: Medicare HMO

## 2022-03-16 ENCOUNTER — Inpatient Hospital Stay: Payer: Medicare HMO | Admitting: Physician Assistant

## 2022-03-16 ENCOUNTER — Inpatient Hospital Stay: Payer: Medicare HMO

## 2022-03-16 ENCOUNTER — Ambulatory Visit: Payer: Medicare HMO | Admitting: Physician Assistant

## 2022-03-16 ENCOUNTER — Encounter: Payer: Self-pay | Admitting: Pulmonary Disease

## 2022-03-16 ENCOUNTER — Encounter: Payer: Self-pay | Admitting: General Practice

## 2022-03-16 VITALS — BP 126/65 | HR 63 | Temp 98.1°F | Resp 16

## 2022-03-16 VITALS — BP 162/75 | HR 63 | Temp 97.0°F | Resp 14 | Wt 118.8 lb

## 2022-03-16 DIAGNOSIS — J479 Bronchiectasis, uncomplicated: Secondary | ICD-10-CM

## 2022-03-16 DIAGNOSIS — E876 Hypokalemia: Secondary | ICD-10-CM

## 2022-03-16 DIAGNOSIS — C3491 Malignant neoplasm of unspecified part of right bronchus or lung: Secondary | ICD-10-CM

## 2022-03-16 DIAGNOSIS — Z95828 Presence of other vascular implants and grafts: Secondary | ICD-10-CM

## 2022-03-16 DIAGNOSIS — Z5111 Encounter for antineoplastic chemotherapy: Secondary | ICD-10-CM

## 2022-03-16 LAB — CBC WITH DIFFERENTIAL (CANCER CENTER ONLY)
Abs Immature Granulocytes: 0.01 10*3/uL (ref 0.00–0.07)
Basophils Absolute: 0 10*3/uL (ref 0.0–0.1)
Basophils Relative: 0 %
Eosinophils Absolute: 0 10*3/uL (ref 0.0–0.5)
Eosinophils Relative: 0 %
HCT: 31.9 % — ABNORMAL LOW (ref 36.0–46.0)
Hemoglobin: 10.4 g/dL — ABNORMAL LOW (ref 12.0–15.0)
Immature Granulocytes: 0 %
Lymphocytes Relative: 15 %
Lymphs Abs: 0.8 10*3/uL (ref 0.7–4.0)
MCH: 28.8 pg (ref 26.0–34.0)
MCHC: 32.6 g/dL (ref 30.0–36.0)
MCV: 88.4 fL (ref 80.0–100.0)
Monocytes Absolute: 0.6 10*3/uL (ref 0.1–1.0)
Monocytes Relative: 11 %
Neutro Abs: 3.8 10*3/uL (ref 1.7–7.7)
Neutrophils Relative %: 74 %
Platelet Count: 185 10*3/uL (ref 150–400)
RBC: 3.61 MIL/uL — ABNORMAL LOW (ref 3.87–5.11)
RDW: 14.4 % (ref 11.5–15.5)
WBC Count: 5.2 10*3/uL (ref 4.0–10.5)
nRBC: 0 % (ref 0.0–0.2)

## 2022-03-16 LAB — CMP (CANCER CENTER ONLY)
ALT: 42 U/L (ref 0–44)
AST: 40 U/L (ref 15–41)
Albumin: 3.9 g/dL (ref 3.5–5.0)
Alkaline Phosphatase: 71 U/L (ref 38–126)
Anion gap: 9 (ref 5–15)
BUN: 19 mg/dL (ref 8–23)
CO2: 28 mmol/L (ref 22–32)
Calcium: 9.3 mg/dL (ref 8.9–10.3)
Chloride: 102 mmol/L (ref 98–111)
Creatinine: 1.21 mg/dL — ABNORMAL HIGH (ref 0.44–1.00)
GFR, Estimated: 48 mL/min — ABNORMAL LOW (ref >60)
Glucose, Bld: 107 mg/dL — ABNORMAL HIGH (ref 70–99)
Potassium: 3.4 mmol/L — ABNORMAL LOW (ref 3.5–5.1)
Sodium: 139 mmol/L (ref 135–145)
Total Bilirubin: 0.2 mg/dL — ABNORMAL LOW (ref 0.3–1.2)
Total Protein: 7.3 g/dL (ref 6.5–8.1)

## 2022-03-16 MED ORDER — SODIUM CHLORIDE 0.9% FLUSH
10.0000 mL | Freq: Once | INTRAVENOUS | Status: AC
  Administered 2022-03-16: 10 mL

## 2022-03-16 MED ORDER — ONDANSETRON HCL 4 MG/2ML IJ SOLN
8.0000 mg | Freq: Once | INTRAMUSCULAR | Status: AC
  Administered 2022-03-16: 8 mg via INTRAVENOUS
  Filled 2022-03-16: qty 4

## 2022-03-16 MED ORDER — CYANOCOBALAMIN 1000 MCG/ML IJ SOLN
1000.0000 ug | Freq: Once | INTRAMUSCULAR | Status: AC
  Administered 2022-03-16: 1000 ug via INTRAMUSCULAR
  Filled 2022-03-16: qty 1

## 2022-03-16 MED ORDER — POTASSIUM CHLORIDE CRYS ER 20 MEQ PO TBCR: 20.0000 meq | EXTENDED_RELEASE_TABLET | Freq: Every day | ORAL | 0 refills | Status: DC

## 2022-03-16 MED ORDER — HEPARIN SOD (PORK) LOCK FLUSH 100 UNIT/ML IV SOLN
500.0000 [IU] | Freq: Once | INTRAVENOUS | Status: AC | PRN
  Administered 2022-03-16: 500 [IU]

## 2022-03-16 MED ORDER — HYOSCYAMINE SULFATE 0.125 MG SL SUBL: 0.2500 mg | SUBLINGUAL_TABLET | Freq: Four times a day (QID) | SUBLINGUAL | 0 refills | Status: DC | PRN

## 2022-03-16 MED ORDER — SODIUM CHLORIDE 0.9 % IV SOLN
400.0000 mg/m2 | Freq: Once | INTRAVENOUS | Status: AC
  Administered 2022-03-16: 600 mg via INTRAVENOUS
  Filled 2022-03-16: qty 20

## 2022-03-16 MED ORDER — SODIUM CHLORIDE 0.9% FLUSH
10.0000 mL | INTRAVENOUS | Status: DC | PRN
  Administered 2022-03-16: 10 mL

## 2022-03-16 MED ORDER — SODIUM CHLORIDE 0.9 % IV SOLN: Freq: Once | INTRAVENOUS | Status: AC

## 2022-03-16 NOTE — Progress Notes (Signed)
[facility] Note  Followed up with Allison [name] in infusion. Her sister Allison [name]") died this week, and her service is Sunday. Allison [name] is helping P[place]chie's children collect family photos for the program. There is some peace for the family in seeing the end of suffering.  Provided opportunity for Allison [name] to share and process this update, as well as emotional/bereavement support and prayer per her request. We plan to follow up at [facility] and future treatments, and Allison [name] knows to reach out as needed/desired.   [place], [place], [facility] Pager [phone] Voicemail [phone]

## 2022-03-16 NOTE — Patient Instructions (Signed)
[facility]   Discharge Instructions: Thank you for choosing [facility] to provide your oncology and hematology care.   If you have a lab appointment with the [facility], please go directly to the [facility] and check in at the registration area.   Wear comfortable clothing and clothing appropriate for easy access to any Portacath or PICC line.   We strive to give you quality time with your provider. You may need to reschedule your appointment if you arrive late (15 or more minutes).  Arriving late affects you and other patients whose appointments are after yours.  Also, if you miss three or more appointments without notifying the office, you may be dismissed from the clinic at the provider's discretion.      For prescription refill requests, have your pharmacy contact our office and allow 72 hours for refills to be completed.    Today you received the following chemotherapy and/or immunotherapy agents Pemetrexed (ALIMTA).      To help prevent nausea and vomiting after your treatment, we encourage you to take your nausea medication as directed.  BELOW ARE SYMPTOMS THAT SHOULD BE REPORTED IMMEDIATELY: *FEVER GREATER THAN 100.4 F (38 C) OR HIGHER *CHILLS OR SWEATING *NAUSEA AND VOMITING THAT IS NOT CONTROLLED WITH YOUR NAUSEA MEDICATION *UNUSUAL SHORTNESS OF BREATH *UNUSUAL BRUISING OR BLEEDING *URINARY PROBLEMS (pain or burning when urinating, or frequent urination) *BOWEL PROBLEMS (unusual diarrhea, constipation, pain near the anus) TENDERNESS IN MOUTH AND THROAT WITH OR WITHOUT PRESENCE OF ULCERS (sore throat, sores in mouth, or a toothache) UNUSUAL RASH, SWELLING OR PAIN  UNUSUAL VAGINAL DISCHARGE OR ITCHING   Items with * indicate a potential emergency and should be followed up as soon as possible or go to the Emergency Department if any problems should occur.  Please show the CHEMOTHERAPY ALERT CARD or IMMUNOTHERAPY ALERT CARD at  check-in to the Emergency Department and triage nurse.  Should you have questions after your visit or need to cancel or reschedule your appointment, please contact [facility]  Dept: [phone]  and follow the prompts.  Office hours are 8:00 a.m. to 4:30 p.m. Monday - Friday. Please note that voicemails left after 4:00 p.m. may not be returned until the following business day.  We are closed weekends and major holidays. You have access to a nurse at all times for urgent questions. Please call the main number to the clinic Dept: [phone] and follow the prompts.   For any non-urgent questions, you may also contact your provider using MyChart. We now offer e-Visits for anyone 51 and older to request care online for non-urgent symptoms. For details visit [email or web address].   Also download the MyChart app! Go to the app store, search "MyChart", open the app, select [facility], and log in with your MyChart username and password.  Masks are optional in the cancer centers. If you would like for your care team to wear a mask while they are taking care of you, please let them know. You may have one support person who is at least 71 years old accompany you for your appointments.  Pemetrexed Injection What is this medication? PEMETREXED (PEM e TREX ed) treats some types of cancer. It works by slowing down the growth of cancer cells. This medicine may be used for other purposes; ask your health care provider or pharmacist if you have questions. COMMON BRAND NAME(S): Alimta, PEMFEXY What should I tell my care team before I  take this medication? They need to know if you have any of these conditions: Infection, such as chickenpox, cold sores, or herpes Kidney disease Low blood cell levels (white cells, red cells, and platelets) Lung or breathing disease, such as asthma Radiation therapy An unusual or allergic reaction to pemetrexed, other medications, foods, dyes, or  preservatives If you or your partner are pregnant or trying to get pregnant Breast-feeding How should I use this medication? This medication is injected into a vein. It is given by your care team in a hospital or clinic setting. Talk to your care team about the use of this medication in children. Special care may be needed. Overdosage: If you think you have taken too much of this medicine contact a poison control center or emergency room at once. NOTE: This medicine is only for you. Do not share this medicine with others. What if I miss a dose? Keep appointments for follow-up doses. It is important not to miss your dose. Call your care team if you are unable to keep an appointment. What may interact with this medication? Do not take this medication with any of the following: Live virus vaccines This medication may also interact with the following: Ibuprofen This list may not describe all possible interactions. Give your health care provider a list of all the medicines, herbs, non-prescription drugs, or dietary supplements you use. Also tell them if you smoke, drink alcohol, or use illegal drugs. Some items may interact with your medicine. What should I watch for while using this medication? Your condition will be monitored carefully while you are receiving this medication. This medication may make you feel generally unwell. This is not uncommon as chemotherapy can affect healthy cells as well as cancer cells. Report any side effects. Continue your course of treatment even though you feel ill unless your care team tells you to stop. This medication can cause serious side effects. To reduce the risk, your care team may give you other medications to take before receiving this one. Be sure to follow the directions from your care team. This medication can cause a rash or redness in areas of the body that have previously had radiation therapy. If you have had radiation therapy, tell your care team if  you notice a rash in this area. This medication may increase your risk of getting an infection. Call your care team for advice if you get a fever, chills, sore throat, or other symptoms of a cold or flu. Do not treat yourself. Try to avoid being around people who are sick. Be careful brushing or flossing your teeth or using a toothpick because you may get an infection or bleed more easily. If you have any dental work done, tell your dentist you are receiving this medication. Avoid taking medications that contain aspirin, acetaminophen, ibuprofen, naproxen, or ketoprofen unless instructed by your care team. These medications may hide a fever. Check with your care team if you have severe diarrhea, nausea, and vomiting, or if you sweat a lot. The loss of too much body fluid may make it dangerous for you to take this medication. Talk to your care team if you or your partner wish to become pregnant or think either of you might be pregnant. This medication can cause serious birth defects if taken during pregnancy and for 6 months after the last dose. A negative pregnancy test is required before starting this medication. A reliable form of contraception is recommended while taking this medication and for 6 months  after the last dose. Talk to your care team about reliable forms of contraception. Do not father a child while taking this medication and for 3 months after the last dose. Use a condom while having sex during this time period. Do not breastfeed while taking this medication and for 1 week after the last dose. This medication may cause infertility. Talk to your care team if you are concerned about your fertility. What side effects may I notice from receiving this medication? Side effects that you should report to your care team as soon as possible: Allergic reactions--skin rash, itching, hives, swelling of the face, lips, tongue, or throat Dry cough, shortness of breath or trouble  breathing Infection--fever, chills, cough, sore throat, wounds that don't heal, pain or trouble when passing urine, general feeling of discomfort or being unwell Kidney injury--decrease in the amount of urine, swelling of the ankles, hands, or feet Low red blood cell level--unusual weakness or fatigue, dizziness, headache, trouble breathing Redness, blistering, peeling, or loosening of the skin, including inside the mouth Unusual bruising or bleeding Side effects that usually do not require medical attention (report to your care team if they continue or are bothersome): Fatigue Loss of appetite Nausea Vomiting This list may not describe all possible side effects. Call your doctor for medical advice about side effects. You may report side effects to FDA at 1-800-FDA-1088. Where should I keep my medication? This medication is given in a hospital or clinic. It will not be stored at home. NOTE: This sheet is a summary. It may not cover all possible information. If you have questions about this medicine, talk to your doctor, pharmacist, or health care provider.  2023 [employer] Standard (2021-10-10 00:00:00)

## 2022-03-21 ENCOUNTER — Other Ambulatory Visit (HOSPITAL_COMMUNITY): Payer: Self-pay

## 2022-03-21 ENCOUNTER — Encounter: Payer: Self-pay | Admitting: Internal Medicine

## 2022-03-21 ENCOUNTER — Telehealth: Payer: Self-pay | Admitting: Medical Oncology

## 2022-03-21 NOTE — Telephone Encounter (Signed)
Spoke with Dr Roland Earl nurse and she will f/u with her re:pt refill.

## 2022-03-21 NOTE — Telephone Encounter (Signed)
Lovenox refill-  Unable to LVM for Dr Roland Earl nurse to send refill for Lovenox 80 mg.

## 2022-03-21 NOTE — Telephone Encounter (Signed)
Lovenox-supply Pt was told incorrectly that Lovenox  80 mg syringes are  no longer available . She needs a refill soon.  Per [facility] is trying to [employer] the other doses including 80 mg this month.  [employer] sends the medication to her PCP, Dr [name].

## 2022-03-22 NOTE — Progress Notes (Unsigned)
Cardiology Office Note:    Date:  03/23/2022   ID:  Allison [name], DOB [birth year], MRN [id number]  PCP:  [name], MD  Cardiologist:  Donato Heinz, MD  Electrophysiologist:  None   Referring MD: [name], MD   Chief Complaint  Patient presents with   Follow-up     History of Present Illness:    Allison [name] is a 71 y.o. female with a hx of CAD status post DES to RCA 06/16/19, stage IV lung cancer, hypertension, hyperlipidemia, DVT/PE, CVA who presents for follow-up.  She presented to ED on 06/12/2019 with chest pain, found to have NSTEMI with peak high-sensitivity troponin 9133.  TTE on 06/13/2019 showed LVEF 55 to 60%, mild LVH, low normal RV systolic function, moderate MR. Cath showed 95% proximal RCA lesion, drug-eluting stent was successfully placed.  Zio patch x13 days on 01/07/2020 showed 3 episodes of NSVT longest lasting 7 beats, 17 episodes of SVT, longest lasting 15 seconds.  At clinic visit in September 2021, she reported chest pains.  Lexiscan Myoview on 03/19/2020 showed normal perfusion, EF 70%.  She was admitted to [facility] 02/08/2021 with acute DVT/PE.  Echocardiogram 02/08/2021 showed normal biventricular function, no significant valvular disease.  She was discharged on Eliquis 02/10/2021.  Subsequently was admitted back to [facility] on 02/11/2021 with acute CVA.  This was thought to be embolic in setting of hypercoagulable state due to stage IV lung cancer.  Oncology recommended switching from Eliquis to Lovenox for anticoagulation.  Echocardiogram 02/13/2021 showed normal LV systolic function, mild to moderate MR. Zio patch x14 days on 07/21/2021 showed no significant arrhythmias.  Since last clinic visit, she had to stop taking aspirin due to GI symptoms.  She also stopped chlorthalidone because of a lightheaded.  Has improved since stopping.  Reports BP has been controlled.  She is continuing on chemotherapy every 3 weeks.  Denies any chest pain.  Reports stable  dyspnea.  Reports lower extremity edema.    Past Medical History:  Diagnosis Date   Anemia    Anxiety    Arthritis    Asthma    exercise induced   Depression    PMH   Dyspnea    GERD (gastroesophageal reflux disease)    Glaucoma    History of radiation therapy 01/05/2021   IMRT right lung  11/24/2020-01/05/2021  Dr [name]   Hypertension    lung ca dx'd 11/2017   right   Malignant pleural effusion    right   Nuclear sclerotic cataract of right eye 02/28/2021   Dr. Warden Fillers, cataract surgery February 2023   PONV (postoperative nausea and vomiting)    Pre-diabetes    Raynaud's disease    Raynaud's disease    Stroke ([place]) 01/2021   balance off, some express aphasia, weakness    Past Surgical History:  Procedure Laterality Date   ABDOMINAL HYSTERECTOMY     partial   BRONCHIAL BIOPSY  04/21/2021   Procedure: BRONCHIAL BIOPSIES;  Surgeon: [name], DO;  Location: [facility];  Service: Pulmonary;;   BRONCHIAL BRUSHINGS  04/21/2021   Procedure: BRONCHIAL BRUSHINGS;  Surgeon: [name], DO;  Location: [place];  Service: Pulmonary;;   BRONCHIAL NEEDLE ASPIRATION BIOPSY  04/21/2021   Procedure: BRONCHIAL NEEDLE ASPIRATION BIOPSIES;  Surgeon: [name], DO;  Location: [place];  Service: Pulmonary;;   CHEST TUBE INSERTION Right 01/01/2018   Procedure: INSERTION PLEURAL DRAINAGE CATHETER;  Surgeon: Ivin Poot, MD;  Location:  [place] [facility];  Service: Thoracic;  Laterality: Right;   CHEST TUBE INSERTION  04/21/2021   Procedure: CHEST TUBE INSERTION;  Surgeon: [name], DO;  Location: [facility] [place] [facility];  Service: Pulmonary;;   COLONOSCOPY     CORONARY STENT INTERVENTION N/A 06/16/2019   Procedure: CORONARY STENT INTERVENTION;  Surgeon: Jettie Booze, MD;  Location: [place] [facility];  Service: Cardiovascular;  Laterality: N/A;   DILATION AND CURETTAGE OF UTERUS     EYE SURGERY     due to Glaucoma   IR IMAGING GUIDED PORT INSERTION   03/22/2021   IR PORT REPAIR CENTRAL VENOUS ACCESS DEVICE  04/15/2021   LEFT HEART CATH AND CORONARY ANGIOGRAPHY N/A 06/16/2019   Procedure: LEFT HEART CATH AND CORONARY ANGIOGRAPHY;  Surgeon: Jettie Booze, MD;  Location: [place] [facility];  Service: Cardiovascular;  Laterality: N/A;   REMOVAL OF PLEURAL DRAINAGE CATHETER Right 11/07/2018   Procedure: REMOVAL OF PLEURAL DRAINAGE CATHETER;  Surgeon: Ivin Poot, MD;  Location: [place];  Service: Thoracic;  Laterality: Right;   ROTATOR CUFF REPAIR     TUBAL LIGATION     VIDEO BRONCHOSCOPY WITH ENDOBRONCHIAL NAVIGATION Bilateral 04/21/2021   Procedure: VIDEO BRONCHOSCOPY WITH ENDOBRONCHIAL NAVIGATION;  Surgeon: [name], DO;  Location: [place];  Service: Pulmonary;  Laterality: Bilateral;  ION   WISDOM TOOTH EXTRACTION      Current Medications: Current Meds  Medication Sig   acetaminophen (TYLENOL) 500 MG tablet Take 1,000 mg by mouth every 6 (six) hours as needed (pain).   ALPRAZolam (XANAX) 0.25 MG tablet Take 1 tablet (0.25 mg total) by mouth 2 (two) times daily as needed for anxiety. May take 2 tablets (0.5 mg total) at bedtime.   amLODipine (NORVASC) 5 MG tablet Take 5 mg by mouth daily.   carvedilol (COREG) 12.5 MG tablet TAKE 1 TABLET BY MOUTH 2 TIMES DAILY.   dorzolamide-timolol (COSOPT) 22.3-6.8 MG/ML ophthalmic solution Place 1 drop into both eyes 2 (two) times daily.   enoxaparin (LOVENOX) 60 MG/0.6ML injection Inject 0.6 mLs (60 mg total) into the skin every 12 (twelve) hours. (Patient taking differently: Inject 80 mg into the skin daily. 80 mg)   ezetimibe (ZETIA) 10 MG tablet Take 10 mg by mouth daily.   FLUoxetine (PROZAC) 20 MG capsule Take 1 capsule (20 mg total) by mouth daily.   folic acid (FOLVITE) 1 MG tablet TAKE 1 TABLET BY MOUTH EVERY DAY   hyoscyamine (LEVSIN SL) 0.125 MG SL tablet Place 2 tablets (0.25 mg total) under the tongue every 6 (six) hours as needed for cramping. Take 1-2 tablets under the  tongue every 6 hours as needed for cramping   ipratropium-albuterol (DUONEB) 0.5-2.5 (3) MG/3ML SOLN Take 3 mLs by nebulization every 6 (six) hours as needed (Asthma).   isosorbide mononitrate (IMDUR) 30 MG 24 hr tablet Take 30 mg by mouth daily.   latanoprost (XALATAN) 0.005 % ophthalmic solution Place 1 drop into both eyes at bedtime.    ondansetron (ZOFRAN-ODT) 4 MG disintegrating tablet Take 1 tablet (4 mg total) by mouth every 8 (eight) hours as needed for nausea or vomiting.   osimertinib mesylate (TAGRISSO) 80 MG tablet Take 1 tablet (80 mg total) by mouth daily.   potassium chloride SA (KLOR-CON M) 20 MEQ tablet Take 1 tablet (20 mEq total) by mouth daily.   REPATHA SURECLICK 426 MG/ML SOAJ INJECT 140 MG INTO THE SKIN EVERY 14 (FOURTEEN) DAYS.   valACYclovir (VALTREX) 500 MG tablet Take 1  tablet twice a day by oral route for 5 days.     Allergies:   Hydrocodone-acetaminophen, Penicillins, Vicodin [hydrocodone-acetaminophen], and Other   Social History   Socioeconomic History   Marital status: Widowed    Spouse name: Not on file   Number of children: 3   Years of education: Not on file   Highest education level: Not on file  Occupational History    Employer: [employer]  Tobacco Use   Smoking status: Never   Smokeless tobacco: Never  Vaping Use   Vaping Use: Never used  Substance and Sexual Activity   Alcohol use: Not Currently    Comment: up to 3 drinks per week   Drug use: No    Comment: CBD oil    Sexual activity: Not Currently    Comment: Hysterectomy  Other Topics Concern   Not on file  Social History Narrative   Not on file   Social Determinants of Health   Financial Resource Strain: Low Risk  (07/22/2019)   Overall Financial Resource Strain (CARDIA)    Difficulty of Paying Living Expenses: Not hard at all  Food Insecurity: No Food Insecurity (07/22/2019)   Hunger Vital Sign    Worried About Running Out of Food in the Last Year: Never true    [place] in  the Last Year: Never true  Transportation Needs: No Transportation Needs (07/22/2019)   PRAPARE - Hydrologist (Medical): No    Lack of Transportation (Non-Medical): No  Physical Activity: Sufficiently Active (07/22/2019)   Exercise Vital Sign    Days of Exercise per Week: 5 days    Minutes of Exercise per Session: 30 min  Stress: Not on file  Social Connections: Unknown (07/22/2019)   Social Connection and Isolation Panel [NHANES]    Frequency of Communication with Friends and Family: Three times a week    Frequency of Social Gatherings with Friends and Family: Once a week    Attends Religious Services: Not on Advertising copywriter or Organizations: Yes    Attends Music therapist: More than 4 times per year    Marital Status: Living with partner     Family History: The patient's family history includes Heart disease in her brother and sister; Lung cancer in an other family member.  ROS:   Please see the history of present illness.     All other systems reviewed and are negative.  EKGs/Labs/Other Studies Reviewed:    The following studies were reviewed today:   EKG:   06/21/21: Normal sinus rhythm, rate 70, no ST abnormality 03/23/22: Normal sinus rhythm, rate 72, no ST abnormalities  Cath 06/16/19: Prox LAD lesion is 25% stenosed. Prox RCA lesion is 95% stenosed. A drug-eluting stent was successfully placed using a SYNERGY XD 2.25X28, postdilated to 2.5 mm. Post intervention, there is a 0% residual stenosis. The left ventricular systolic function is normal. LV end diastolic pressure is normal. The left ventricular ejection fraction is 55-65% by visual estimate. There is no aortic valve stenosis.   Continue dual antiplatelet therapy.  Would check with Pharmacy about interactions of Plavix with Tagrisso.  If there is an interaction, would switch to Brilinta.        TTE 06/13/19:  1. Left ventricular ejection fraction, by  visual estimation, is 55 to  60%. The left ventricle has normal function. There is mildly increased  left ventricular hypertrophy.   2. The  left ventricle has no regional wall motion abnormalities.   3. Global right ventricle has low normal systolic function.The right  ventricular size is normal. No increase in right ventricular wall  thickness.   4. Left atrial size was normal.   5. Right atrial size was normal.   6. Mild mitral annular calcification.   7. The mitral valve is degenerative. Moderate mitral valve regurgitation.   8. The tricuspid valve is grossly normal.   9. The aortic valve is tricuspid. Aortic valve regurgitation is not  visualized. No evidence of aortic valve sclerosis or stenosis.  10. The pulmonic valve was grossly normal. Pulmonic valve regurgitation is  not visualized.  11. Normal pulmonary artery systolic pressure.  12. The inferior vena cava is normal in size with greater than 50%  respiratory variability, suggesting right atrial pressure of 3 mmHg.   Carotid duplex 08/01/19: Right Carotid: There is no evidence of stenosis in the right ICA. The                 extracranial vessels were near-normal with only minimal  wall                 thickening or plaque.   Left Carotid: Velocities in the left ICA are consistent with a 1-39%  stenosis.                Non-hemodynamically significant plaque <50% noted in the  CCA.   Vertebrals:  Bilateral vertebral arteries demonstrate antegrade flow.  Subclavians: Normal flow hemodynamics were seen in bilateral subclavian               arteries.   Recent Labs: 09/29/2021: Magnesium 1.7 03/16/2022: ALT 42; BUN 19; Creatinine 1.21; Hemoglobin 10.4; Platelet Count 185; Potassium 3.4; Sodium 139  Recent Lipid Panel    Component Value Date/Time   CHOL 175 02/12/2021 0559   CHOL 153 11/01/2020 0839   TRIG 110 02/12/2021 0559   HDL 62 02/12/2021 0559   HDL 64 11/01/2020 0839   CHOLHDL 2.8 02/12/2021 0559   VLDL 22  02/12/2021 0559   LDLCALC 91 02/12/2021 0559   LDLCALC 74 11/01/2020 0839    Physical Exam:    VS:  BP (!) 144/72 (BP Location: Left Arm, Patient Position: Sitting, Cuff Size: Normal)   Ht 5\' 4"  (1.626 m)   Wt 119 lb 6.4 oz (54.2 kg)   LMP  (LMP Unknown)   SpO2 99%   BMI 20.49 kg/m     Wt Readings from Last 3 Encounters:  03/23/22 119 lb 6.4 oz (54.2 kg)  03/16/22 118 lb 12.8 oz (53.9 kg)  02/23/22 119 lb 6.4 oz (54.2 kg)     GEN:  Well nourished, well developed in no acute distress HEENT: Normal NECK: No JVD CARDIAC: RRR, no murmurs, rubs, gallops RESPIRATORY:  Clear to auscultation without rales, wheezing or rhonchi  ABDOMEN: Soft, non-tender, non-distended MUSCULOSKELETAL:  No edema SKIN: Warm and dry NEUROLOGIC:  Alert and oriented x 3 PSYCHIATRIC:  Normal affect   ASSESSMENT:    1. CAD in native artery   2. Cerebrovascular accident (CVA), unspecified mechanism ([place])   3. Pulmonary embolism, unspecified chronicity, unspecified pulmonary embolism type, unspecified whether acute cor pulmonale present ([place])   4. Primary hypertension   5. Hyperlipidemia, unspecified hyperlipidemia type       PLAN:    CAD: Status post drug-eluting stent to RCA on 06/16/2019.  At clinic visit in September 2021, she reported chest pains.  Lexiscan  Myoview on 03/19/2020 showed normal perfusion, EF 70%.  She reported exertional chest pain and repeat Myoview 03/29/2021 showed normal perfusion, EF 68%. -She stopped taking aspirin due to GI symptoms.  Okay to discontinue aspirin given she is on therapeutic Lovenox -Continue Coreg 12.5 mg twice daily -Has had issues starting statins due to transaminitis and myalgias.  Referred to lipid clinic and started on Praluent in September 2021.   DVT/PE: admitted to [facility] 02/08/2021 with acute DVT/PE.  She was started on Eliquis, but switched to therapeutic Lovenox after her CVA 02/10/2021 per oncology  CVA: admitted back to [facility] on 02/11/2021 with acute  CVA.  This was thought to be embolic in setting of hypercoagulable state due to stage IV lung cancer.  Oncology recommended switching from Eliquis to Lovenox for anticoagulation.  Palpitations: Description concerning for arrhythmia.  Zio patch x13 days on 01/07/2020 showed 3 episodes of NSVT longest lasting 7 beats, 17 episodes of SVT, longest lasting 15 seconds.  Continue carvedilol.  Carotid bruit: 1 to 39% left carotid artery stenosis, no stenosis in right coronary artery on carotid duplex 08/01/2019.  Hyperlipidemia: LDL 148 on 06/13/2019, improved to LDL 44 08/15/19 while on Lipitor 80 mg daily, but was discontinued due to transaminitis.  She also reported having myalgias with atorvastatin.  Referred to lipid clinic, started on rosuvastatin 5 mg every other day but developed myalgias.  Was reduced to twice weekly but continued to have myalgias so was discontinued.  Started on Praluent September 2021.  LDL 80 on 06/15/2020, Praluent dose increased to 150 mg every 14 days.  LDL 91 on 02/12/21  Hypertension: On carvedilol 12.5 mg twice daily, amlodipine 5mg  daily, and Imdur 30 mg daily.  Recently stopped chlorthalidone due to feeling lightheaded, has improved with stopping.  Asked to check BP daily for next week and call with results  Stage IV lung cancer: Followed by oncology.  Has been on Tagrisso.  CT showed progression of disease, now on chemotherapy with alimta.   Syncope: Description suggest orthostasis.  Zio patch x14 days on 07/21/2021 showed no significant arrhythmias.  RTC in 6 months   Medication Adjustments/Labs and Tests Ordered: Current medicines are reviewed at length with the patient today.  Concerns regarding medicines are outlined above.  Orders Placed This Encounter  Procedures   EKG 12-Lead    No orders of the defined types were placed in this encounter.    Patient Instructions  Medication Instructions:  Your physician recommends that you continue on your current  medications as directed. Please refer to the Current Medication list given to you today.  *If you need a refill on your cardiac medications before your next appointment, please call your pharmacy*  Follow-Up: At [facility], you and your health needs are our priority.  As part of our continuing mission to provide you with exceptional heart care, we have created designated Provider Care Teams.  These Care Teams include your primary Cardiologist (physician) and Advanced Practice Providers (APPs -  Physician Assistants and Nurse Practitioners) who all work together to provide you with the care you need, when you need it.  We recommend signing up for the patient portal called "MyChart".  Sign up information is provided on this After Visit Summary.  MyChart is used to connect with patients for Virtual Visits (Telemedicine).  Patients are able to view lab/test results, encounter notes, upcoming appointments, etc.  Non-urgent messages can be sent to your provider as well.   To learn more about what  you can do with MyChart, go to [email or web address].    Your next appointment:   6 month(s)  The format for your next appointment:   In Person  Provider:   Donato Heinz, MD           Signed, Donato Heinz, MD  03/23/2022 5:34 PM    [place]

## 2022-03-23 ENCOUNTER — Ambulatory Visit: Payer: Medicare HMO | Attending: Cardiology | Admitting: Cardiology

## 2022-03-23 ENCOUNTER — Encounter: Payer: Self-pay | Admitting: Cardiology

## 2022-03-23 VITALS — BP 144/72 | Ht 64.0 in | Wt 119.4 lb

## 2022-03-23 DIAGNOSIS — I10 Essential (primary) hypertension: Secondary | ICD-10-CM | POA: Diagnosis not present

## 2022-03-23 DIAGNOSIS — I639 Cerebral infarction, unspecified: Secondary | ICD-10-CM | POA: Diagnosis not present

## 2022-03-23 DIAGNOSIS — I2510 Atherosclerotic heart disease of native coronary artery without angina pectoris: Secondary | ICD-10-CM

## 2022-03-23 DIAGNOSIS — E785 Hyperlipidemia, unspecified: Secondary | ICD-10-CM

## 2022-03-23 DIAGNOSIS — I2699 Other pulmonary embolism without acute cor pulmonale: Secondary | ICD-10-CM | POA: Diagnosis not present

## 2022-03-23 NOTE — Patient Instructions (Signed)
Medication Instructions:  Your physician recommends that you continue on your current medications as directed. Please refer to the Current Medication list given to you today.  *If you need a refill on your cardiac medications before your next appointment, please call your pharmacy*  Follow-Up: At [facility], you and your health needs are our priority.  As part of our continuing mission to provide you with exceptional heart care, we have created designated Provider Care Teams.  These Care Teams include your primary Cardiologist (physician) and Advanced Practice Providers (APPs -  Physician Assistants and Nurse Practitioners) who all work together to provide you with the care you need, when you need it.  We recommend signing up for the patient portal called "MyChart".  Sign up information is provided on this After Visit Summary.  MyChart is used to connect with patients for Virtual Visits (Telemedicine).  Patients are able to view lab/test results, encounter notes, upcoming appointments, etc.  Non-urgent messages can be sent to your provider as well.   To learn more about what you can do with MyChart, go to [email or web address].    Your next appointment:   6 month(s)  The format for your next appointment:   In Person  Provider:   Christopher L Schumann, MD         

## 2022-03-23 NOTE — Telephone Encounter (Signed)
Lovenox comes in 40mg . Pt can use two to equal her 80mg .  I have called and spoken with the pt to discuss this and she is willing to take the two doses temporarily until the 80mg  dose becomes available.  I have also called Jana Half w/Dr. [name]'s office and LVM advising of this.

## 2022-03-29 ENCOUNTER — Telehealth: Payer: Self-pay | Admitting: Pharmacy Technician

## 2022-03-29 NOTE — Telephone Encounter (Signed)
Oral Oncology Patient Advocate Encounter   Received notification that patient is due for re-enrollment for assistance for Tagrisso through [employer].   Re-enrollment process has been initiated and will be submitted upon completion of necessary documents.   I have spoken to the patient, will meet before their appointment on 04/06/22 to collect signature and POI.   [employer] phone number [phone].   I will continue to follow until final determination.   Lady Deutscher, CPhT-Adv Oncology Pharmacy Patient [place] Direct Number: [phone]  Fax: [phone]

## 2022-03-30 ENCOUNTER — Encounter: Payer: Self-pay | Admitting: Internal Medicine

## 2022-03-30 ENCOUNTER — Encounter: Payer: Self-pay | Admitting: Physician Assistant

## 2022-03-30 NOTE — Progress Notes (Signed)
Allison [name] met patient for counseling session.   Patient reported the mantra introduced to them last week, "I am safe, this is normal, this will pass," helped them to manage their anxiety last week.   The patient and counselor identified that the patient begins to feel anxious after they have felt energy and begin to feel tired. The patient reported that when they have energy they don't remember they are sick. The patient expressed when they begin to feel tired they are reminded again that they are sick. The moment of being reminded of their illness, is the moment they become anxious. The patient reported that when they sit down and are idle, their mind begins to turn towards the anxiety.   The counselor introduced a new mindset. When the patient begins to feel tired, the patient will allows exhaustion to come in for a visit.   The patient scheduled their next appointment for Thursday, October 26th at 10am.   Allison [name],  Counseling Intern  

## 2022-03-31 ENCOUNTER — Encounter: Payer: Self-pay | Admitting: Internal Medicine

## 2022-03-31 ENCOUNTER — Encounter: Payer: Self-pay | Admitting: Physician Assistant
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
## 2022-04-03 ENCOUNTER — Ambulatory Visit (HOSPITAL_COMMUNITY)
Admission: RE | Admit: 2022-04-03 | Discharge: 2022-04-03 | Disposition: A | Discharge status: Home / Self Care | Payer: Medicare HMO | Source: Ambulatory Visit | Attending: Physician Assistant | Admitting: Physician Assistant

## 2022-04-03 ENCOUNTER — Telehealth: Payer: Self-pay | Admitting: Internal Medicine

## 2022-04-03 ENCOUNTER — Encounter: Payer: Self-pay | Admitting: Nurse Practitioner

## 2022-04-03 ENCOUNTER — Inpatient Hospital Stay: Payer: Medicare HMO | Attending: Internal Medicine | Admitting: Nurse Practitioner

## 2022-04-03 DIAGNOSIS — Z79899 Other long term (current) drug therapy: Secondary | ICD-10-CM | POA: Insufficient documentation

## 2022-04-03 DIAGNOSIS — F419 Anxiety disorder, unspecified: Secondary | ICD-10-CM | POA: Diagnosis not present

## 2022-04-03 DIAGNOSIS — C3491 Malignant neoplasm of unspecified part of right bronchus or lung: Secondary | ICD-10-CM | POA: Diagnosis present

## 2022-04-03 DIAGNOSIS — R530 Neoplastic (malignant) related fatigue: Secondary | ICD-10-CM | POA: Diagnosis not present

## 2022-04-03 DIAGNOSIS — C7931 Secondary malignant neoplasm of brain: Secondary | ICD-10-CM | POA: Insufficient documentation

## 2022-04-03 DIAGNOSIS — C3411 Malignant neoplasm of upper lobe, right bronchus or lung: Secondary | ICD-10-CM | POA: Insufficient documentation

## 2022-04-03 DIAGNOSIS — Z515 Encounter for palliative care: Secondary | ICD-10-CM | POA: Diagnosis not present

## 2022-04-03 DIAGNOSIS — E876 Hypokalemia: Secondary | ICD-10-CM | POA: Insufficient documentation

## 2022-04-03 DIAGNOSIS — K219 Gastro-esophageal reflux disease without esophagitis: Secondary | ICD-10-CM | POA: Insufficient documentation

## 2022-04-03 DIAGNOSIS — I10 Essential (primary) hypertension: Secondary | ICD-10-CM | POA: Insufficient documentation

## 2022-04-03 DIAGNOSIS — Z5111 Encounter for antineoplastic chemotherapy: Secondary | ICD-10-CM | POA: Insufficient documentation

## 2022-04-03 DIAGNOSIS — I2510 Atherosclerotic heart disease of native coronary artery without angina pectoris: Secondary | ICD-10-CM | POA: Insufficient documentation

## 2022-04-03 MED ORDER — HEPARIN SOD (PORK) LOCK FLUSH 100 UNIT/ML IV SOLN
INTRAVENOUS | Status: AC
  Filled 2022-04-03: qty 5

## 2022-04-03 MED ORDER — HEPARIN SOD (PORK) LOCK FLUSH 100 UNIT/ML IV SOLN
500.0000 [IU] | Freq: Once | INTRAVENOUS | Status: AC
  Administered 2022-04-03: 500 [IU] via INTRAVENOUS

## 2022-04-03 MED ORDER — IOHEXOL 9 MG/ML PO SOLN
1000.0000 mL | Freq: Once | ORAL | Status: AC
  Administered 2022-04-03: 1000 mL via ORAL

## 2022-04-03 MED ORDER — IOHEXOL 300 MG/ML  SOLN
80.0000 mL | Freq: Once | INTRAMUSCULAR | Status: AC | PRN
  Administered 2022-04-03: 80 mL via INTRAVENOUS

## 2022-04-03 NOTE — Progress Notes (Signed)
[place]  [phone] [phone]) [phone]   Name: Allison [name] Date: 04/03/2022 MRN: [id number]  DOB: Dec 12, 1950  Patient Care Team: [name], MD as PCP - General (Internal Medicine) Donato Heinz, MD as PCP - Cardiology (Cardiology) [name], MD as Consulting Physician (Radiation Oncology) Pickenpack-Cousar, Carlena Sax, NP as Nurse Practitioner (Nurse Practitioner) Valrie Hart, RN as Oncology Nurse Navigator (Oncology) Curt Bears, MD as Consulting Physician (Oncology) Gayland Curry, DO (Geriatric Medicine)   I connected with Allison [name] on 04/03/22 at 10:00 AM EDT by pone and verified that I am speaking with the correct person using two identifiers.   I discussed the limitations, risks, security and privacy concerns of performing an evaluation and management service by telemedicine and the availability of in-person appointments. I also discussed with the patient that there may be a patient responsible charge related to this service. The patient expressed understanding and agreed to proceed.   Other persons participating in the visit and their role in the encounter: [place], RN   Patient's location: Home  Provider's location: [place] [facility]   INTERVAL HISTORY: Allison [name] is a 71 y.o. female with stage IV non-small cell lung cancer (12/2017), hypertension, CVA, CAD, DVT/PE (on Lovenox), and GERD.  Palliative ask to see for symptom management and goals of care.  SOCIAL HISTORY:     reports that she has never smoked. She has never used smokeless tobacco. She reports that she does not currently use alcohol. She reports that she does not use drugs.  ADVANCE DIRECTIVES:  None on file   CODE STATUS: DNR  PAST MEDICAL HISTORY: Past Medical History:  Diagnosis Date   Anemia    Anxiety    Arthritis    Asthma    exercise induced   Depression    PMH   Dyspnea    GERD  (gastroesophageal reflux disease)    Glaucoma    History of radiation therapy 01/05/2021   IMRT right lung  11/24/2020-01/05/2021  Dr [name]   Hypertension    lung ca dx'd 11/2017   right   Malignant pleural effusion    right   Nuclear sclerotic cataract of right eye 02/28/2021   Dr. Warden Fillers, cataract surgery February 2023   PONV (postoperative nausea and vomiting)    Pre-diabetes    Raynaud's disease    Raynaud's disease    Stroke ([place]) 01/2021   balance off, some express aphasia, weakness    ALLERGIES:  is allergic to hydrocodone-acetaminophen, penicillins, vicodin [hydrocodone-acetaminophen], and other.  MEDICATIONS:  Current Outpatient Medications  Medication Sig Dispense Refill   acetaminophen (TYLENOL) 500 MG tablet Take 1,000 mg by mouth every 6 (six) hours as needed (pain).     ALPRAZolam (XANAX) 0.25 MG tablet Take 1 tablet (0.25 mg total) by mouth 2 (two) times daily as needed for anxiety. May take 2 tablets (0.5 mg total) at bedtime. 60 tablet 0   amLODipine (NORVASC) 5 MG tablet Take 5 mg by mouth daily.     brompheniramine-pseudoephedrine-DM 30-2-10 MG/5ML syrup Take 5 mLs by mouth 3 (three) times daily as needed. (Patient not taking: Reported on 03/09/2022) 120 mL 0   carvedilol (COREG) 12.5 MG tablet TAKE 1 TABLET BY MOUTH 2 TIMES DAILY. 180 tablet 1   cetirizine (ZYRTEC) 5 MG tablet Take 5 mg by mouth daily. (Patient not taking: Reported on 03/09/2022)     dexamethasone (DECADRON) 4 MG tablet Take  1 tablet twice a day the day before, the day of, and the day after chemotherapy (Patient not taking: Reported on 03/23/2022) 40 tablet 2   docusate sodium (COLACE) 100 MG capsule Take 1 capsule (100 mg total) by mouth every other day. (Patient not taking: Reported on 03/09/2022) 15 capsule 5   dorzolamide-timolol (COSOPT) 22.3-6.8 MG/ML ophthalmic solution Place 1 drop into both eyes 2 (two) times daily.     dronabinol (MARINOL) 2.5 MG capsule Take 1 capsule (2.5 mg  total) by mouth 2 (two) times daily as needed (decreased appetite). (Patient not taking: Reported on 03/09/2022) 60 capsule 0   enoxaparin (LOVENOX) 60 MG/0.6ML injection Inject 0.6 mLs (60 mg total) into the skin every 12 (twelve) hours. (Patient taking differently: Inject 80 mg into the skin daily. 80 mg) 60 mL 1   esomeprazole (NEXIUM) 20 MG capsule Take 1 capsule (20 mg total) by mouth in the morning and at bedtime. (Patient not taking: Reported on 03/09/2022) 60 capsule 2   ezetimibe (ZETIA) 10 MG tablet Take 10 mg by mouth daily.     FLUoxetine (PROZAC) 20 MG capsule Take 1 capsule (20 mg total) by mouth daily. 90 capsule 3   folic acid (FOLVITE) 1 MG tablet TAKE 1 TABLET BY MOUTH EVERY DAY 90 tablet 1   hyoscyamine (LEVSIN SL) 0.125 MG SL tablet Place 2 tablets (0.25 mg total) under the tongue every 6 (six) hours as needed for cramping. Take 1-2 tablets under the tongue every 6 hours as needed for cramping 30 tablet 0   ipratropium-albuterol (DUONEB) 0.5-2.5 (3) MG/3ML SOLN Take 3 mLs by nebulization every 6 (six) hours as needed (Asthma).     isosorbide mononitrate (IMDUR) 30 MG 24 hr tablet Take 30 mg by mouth daily.     latanoprost (XALATAN) 0.005 % ophthalmic solution Place 1 drop into both eyes at bedtime.      ondansetron (ZOFRAN-ODT) 4 MG disintegrating tablet Take 1 tablet (4 mg total) by mouth every 8 (eight) hours as needed for nausea or vomiting. 30 tablet 1   osimertinib mesylate (TAGRISSO) 80 MG tablet Take 1 tablet (80 mg total) by mouth daily. 30 tablet 3   potassium chloride SA (KLOR-CON M) 20 MEQ tablet Take 1 tablet (20 mEq total) by mouth daily. 4 tablet 0   REPATHA SURECLICK 867 MG/ML SOAJ INJECT 140 MG INTO THE SKIN EVERY 14 (FOURTEEN) DAYS. 6 mL 3   valACYclovir (VALTREX) 500 MG tablet Take 1 tablet twice a day by oral route for 5 days.     No current facility-administered medications for this visit.    VITAL SIGNS: LMP  (LMP Unknown)  There were no vitals filed for  this visit.   Estimated body mass index is 20.49 kg/m as calculated from the following:   Height as of 03/23/22: 5\' 4"  (1.626 m).   Weight as of 03/23/22: 119 lb 6.4 oz (54.2 kg).   PERFORMANCE STATUS (ECOG) : 1 - Symptomatic but completely ambulatory   IMPRESSION:  I connected with Allison [name] by phone for symptom management follow-up.  No acute distress identified.  She does express some anxiety with upcoming scans later today.  Appetite continues to be good.  Denies pain or discomfort.  Fatigue  Resolved. Reports energy and activity level is great. She is able to to do many of the things she enjoys.    Anxiety Overall patient's anxiety is improved.  She is implementing anxiety relieving methods as discussed and taught during her visits  with the spiritual support team here at the cancer center.  She shares today is not the best day.  She is scheduled to have a scan which always brings her increased anxiety and what they would show.  Emotional support provided.  We focused on relaxation techniques.  Also advised to take a Xanax due to increased anxiety.  Allison [name] will continue to focus on relaxation techniques, focused breathing, and guided imagery. She is appreciative that her anxiety levels are decreasing. Able to sleep better throughout the night. Is taking the xanax at bedtime when needed. She is also followed by home palliative with [name]. Will continue to take things day by day and practice relaxation.   PLAN: Symptoms are well controlled. Appetite is much improved. She is being followed by the dietician.  Nexium twice daily for indigestion. Tolerating well.  Anxiety well controlled. Occasional use of Xanax at bedtime.  Advised to take a dose after our phone call due to increased anxiety related to upcoming scans later today. I will plan to see back in 6-8 weeks in collaboration with her other oncology appointments.    Patient expressed understanding and was in agreement with  this plan. She also understands that She can call the clinic at any time with any questions, concerns, or complaints.     Any controlled substances utilized were prescribed in the context of palliative care. PDMP has been reviewed.    Time Total: 35 min   Visit consisted of counseling and education dealing with the complex and emotionally intense issues of symptom management and palliative care in the setting of serious and potentially life-threatening illness.Greater than 50%  of this time was spent counseling and coordinating care related to the above assessment and plan.  Alda Lea, AGPCNP-BC  Palliative Medicine Team/[facility]

## 2022-04-03 NOTE — Telephone Encounter (Signed)
Called patient regarding upcoming October and November appointments, left a voicemail.

## 2022-04-04 NOTE — Progress Notes (Unsigned)
[facility] OFFICE PROGRESS NOTE  [name], [place] [address]  DIAGNOSIS: Stage IV (T2 a,N2, M1a) non-small cell lung cancer, adenocarcinoma diagnosed in July 2019 and presented with right upper lobe lung mass in addition to mediastinal lymphadenopathy as well as bilateral pulmonary nodules and malignant right pleural effusion.   Biomarker Findings Microsatellite status - Cannot Be Determined Tumor Mutational Burden - Cannot Be Determined Genomic Findings For a complete list of the genes assayed, please refer to the Appendix. EGFR exon 19 deletion ([id number]) TP53 Y220C 7 Disease relevant genes with no reportable alterations: KRAS, ALK, BRAF, MET, RET, ERBB2, ROS1   PRIOR THERAPY: 1) Status post right Pleurx catheter placement by Dr. Prescott Gum for drainage of malignant right pleural effusion. 2) palliative radiotherapy to the enlarging right upper lobe lung mass and mediastinum under the care of Dr. Sondra Come expected to be completed on January 05, 2021. 3) Tagrisso 80 mg p.o. daily.  First dose was given on 01/29/2018.  Status post 39 months of treatment.  CURRENT THERAPY: Systemic chemotherapy with carboplatin for AUC of 5 and Alimta 500 Mg/M2 every 3 weeks.  First dose 03/17/2021.  The patient will also continue her current treatment with Tagrisso 80 mg p.o. daily.  She is status post 18 cycles. Starting from cycle #7, she will be on Alimta only 500 mg/m2.  Alimta was eventually reduced to 400 mg per metered squared due to renal insufficiency  INTERVAL HISTORY: Allison MODE 71 y.o. female returns to clinic today for follow-up visit accompanied by her boyfriend.  The patient is currently undergoing single agent dose reduced Alimta IV every 3 weeks.  The dose of Alimta was reduced due to renal insufficiency.  Since last being seen, the patient denies any changes in her health.  She follows closely with palliative care and overall many  of her symptoms have improved.  Her anxiety is controlled although she had some increased anxiety due to having a recent restaging scan.  Her appetite is also improved.  She denies any recent fever, chills, or night sweats.  She denies any changes with her breathing such as new dyspnea on exertion or changes in her chronic cough.  She denies any chest pain or hemoptysis.  She uses Tylenol for chest tightness and she uses a vibrating vest to help break up Mucinex secretions for her chronic cough.  She follows with Dr. Valeta Harms from [facility] pulmonary medicine.  The patient denies any nausea or vomiting although she has some indigestion and belching for which she takes Pepcid.  She also takes ***for abdominal cramping.  She denies any diarrhea.  She manages her constipation with dietary changes.  He continues to take her iron supplement.  She denies any headache or visual changes.  The patient recently had a restaging CT scan performed.  She is here today for evaluation to review her scan results before undergoing cycle #19.  MEDICAL HISTORY: Past Medical History:  Diagnosis Date   Anemia    Anxiety    Arthritis    Asthma    exercise induced   Depression    PMH   Dyspnea    GERD (gastroesophageal reflux disease)    Glaucoma    History of radiation therapy 01/05/2021   IMRT right lung  11/24/2020-01/05/2021  Dr [name]   Hypertension    lung ca dx'd 11/2017   right   Malignant pleural effusion    right   Nuclear sclerotic  cataract of right eye 02/28/2021   Dr. Warden Fillers, cataract surgery February 2023   PONV (postoperative nausea and vomiting)    Pre-diabetes    Raynaud's disease    Raynaud's disease    Stroke ([place]) 01/2021   balance off, some express aphasia, weakness    ALLERGIES:  is allergic to hydrocodone-acetaminophen, penicillins, vicodin [hydrocodone-acetaminophen], and other.  MEDICATIONS:  Current Outpatient Medications  Medication Sig Dispense Refill   acetaminophen  (TYLENOL) 500 MG tablet Take 1,000 mg by mouth every 6 (six) hours as needed (pain).     ALPRAZolam (XANAX) 0.25 MG tablet Take 1 tablet (0.25 mg total) by mouth 2 (two) times daily as needed for anxiety. May take 2 tablets (0.5 mg total) at bedtime. 60 tablet 0   amLODipine (NORVASC) 5 MG tablet Take 5 mg by mouth daily.     brompheniramine-pseudoephedrine-DM 30-2-10 MG/5ML syrup Take 5 mLs by mouth 3 (three) times daily as needed. (Patient not taking: Reported on 03/09/2022) 120 mL 0   carvedilol (COREG) 12.5 MG tablet TAKE 1 TABLET BY MOUTH 2 TIMES DAILY. 180 tablet 1   cetirizine (ZYRTEC) 5 MG tablet Take 5 mg by mouth daily. (Patient not taking: Reported on 03/09/2022)     dexamethasone (DECADRON) 4 MG tablet Take 1 tablet twice a day the day before, the day of, and the day after chemotherapy (Patient not taking: Reported on 03/23/2022) 40 tablet 2   docusate sodium (COLACE) 100 MG capsule Take 1 capsule (100 mg total) by mouth every other day. (Patient not taking: Reported on 03/09/2022) 15 capsule 5   dorzolamide-timolol (COSOPT) 22.3-6.8 MG/ML ophthalmic solution Place 1 drop into both eyes 2 (two) times daily.     dronabinol (MARINOL) 2.5 MG capsule Take 1 capsule (2.5 mg total) by mouth 2 (two) times daily as needed (decreased appetite). (Patient not taking: Reported on 03/09/2022) 60 capsule 0   enoxaparin (LOVENOX) 60 MG/0.6ML injection Inject 0.6 mLs (60 mg total) into the skin every 12 (twelve) hours. (Patient taking differently: Inject 80 mg into the skin daily. 80 mg) 60 mL 1   esomeprazole (NEXIUM) 20 MG capsule Take 1 capsule (20 mg total) by mouth in the morning and at bedtime. (Patient not taking: Reported on 03/09/2022) 60 capsule 2   ezetimibe (ZETIA) 10 MG tablet Take 10 mg by mouth daily.     FLUoxetine (PROZAC) 20 MG capsule Take 1 capsule (20 mg total) by mouth daily. 90 capsule 3   folic acid (FOLVITE) 1 MG tablet TAKE 1 TABLET BY MOUTH EVERY DAY 90 tablet 1   hyoscyamine (LEVSIN  SL) 0.125 MG SL tablet Place 2 tablets (0.25 mg total) under the tongue every 6 (six) hours as needed for cramping. Take 1-2 tablets under the tongue every 6 hours as needed for cramping 30 tablet 0   ipratropium-albuterol (DUONEB) 0.5-2.5 (3) MG/3ML SOLN Take 3 mLs by nebulization every 6 (six) hours as needed (Asthma).     isosorbide mononitrate (IMDUR) 30 MG 24 hr tablet Take 30 mg by mouth daily.     latanoprost (XALATAN) 0.005 % ophthalmic solution Place 1 drop into both eyes at bedtime.      ondansetron (ZOFRAN-ODT) 4 MG disintegrating tablet Take 1 tablet (4 mg total) by mouth every 8 (eight) hours as needed for nausea or vomiting. 30 tablet 1   osimertinib mesylate (TAGRISSO) 80 MG tablet Take 1 tablet (80 mg total) by mouth daily. 30 tablet 3   potassium chloride SA (KLOR-CON M) 20  MEQ tablet Take 1 tablet (20 mEq total) by mouth daily. 4 tablet 0   REPATHA SURECLICK 983 MG/ML SOAJ INJECT 140 MG INTO THE SKIN EVERY 14 (FOURTEEN) DAYS. 6 mL 3   valACYclovir (VALTREX) 500 MG tablet Take 1 tablet twice a day by oral route for 5 days.     No current facility-administered medications for this visit.    SURGICAL HISTORY:  Past Surgical History:  Procedure Laterality Date   ABDOMINAL HYSTERECTOMY     partial   BRONCHIAL BIOPSY  04/21/2021   Procedure: BRONCHIAL BIOPSIES;  Surgeon: [name], DO;  Location: [facility];  Service: Pulmonary;;   BRONCHIAL BRUSHINGS  04/21/2021   Procedure: BRONCHIAL BRUSHINGS;  Surgeon: [name], DO;  Location: [facility];  Service: Pulmonary;;   BRONCHIAL NEEDLE ASPIRATION BIOPSY  04/21/2021   Procedure: BRONCHIAL NEEDLE ASPIRATION BIOPSIES;  Surgeon: [name], DO;  Location: [facility];  Service: Pulmonary;;   CHEST TUBE INSERTION Right 01/01/2018   Procedure: INSERTION PLEURAL DRAINAGE CATHETER;  Surgeon: Ivin Poot, MD;  Location: [facility] OR;  Service: Thoracic;  Laterality: Right;   CHEST TUBE INSERTION  04/21/2021   Procedure:  CHEST TUBE INSERTION;  Surgeon: [name], DO;  Location: [facility];  Service: Pulmonary;;   COLONOSCOPY     CORONARY STENT INTERVENTION N/A 06/16/2019   Procedure: CORONARY STENT INTERVENTION;  Surgeon: Jettie Booze, MD;  Location: [facility];  Service: Cardiovascular;  Laterality: N/A;   DILATION AND CURETTAGE OF UTERUS     EYE SURGERY     due to Glaucoma   IR IMAGING GUIDED PORT INSERTION  03/22/2021   IR PORT REPAIR CENTRAL VENOUS ACCESS DEVICE  04/15/2021   LEFT HEART CATH AND CORONARY ANGIOGRAPHY N/A 06/16/2019   Procedure: LEFT HEART CATH AND CORONARY ANGIOGRAPHY;  Surgeon: Jettie Booze, MD;  Location: [facility];  Service: Cardiovascular;  Laterality: N/A;   REMOVAL OF PLEURAL DRAINAGE CATHETER Right 11/07/2018   Procedure: REMOVAL OF PLEURAL DRAINAGE CATHETER;  Surgeon: Ivin Poot, MD;  Location: [facility];  Service: Thoracic;  Laterality: Right;   ROTATOR CUFF REPAIR     TUBAL LIGATION     VIDEO BRONCHOSCOPY WITH ENDOBRONCHIAL NAVIGATION Bilateral 04/21/2021   Procedure: VIDEO BRONCHOSCOPY WITH ENDOBRONCHIAL NAVIGATION;  Surgeon: [name], DO;  Location: [facility];  Service: Pulmonary;  Laterality: Bilateral;  ION   WISDOM TOOTH EXTRACTION      REVIEW OF SYSTEMS:   Review of Systems  Constitutional: Negative for appetite change, chills, fatigue, fever and unexpected weight change.  HENT:   Negative for mouth sores, nosebleeds, sore throat and trouble swallowing.   Eyes: Negative for eye problems and icterus.  Respiratory: Negative for cough, hemoptysis, shortness of breath and wheezing.   Cardiovascular: Negative for chest pain and leg swelling.  Gastrointestinal: Negative for abdominal pain, constipation, diarrhea, nausea and vomiting.  Genitourinary: Negative for bladder incontinence, difficulty urinating, dysuria, frequency and hematuria.   Musculoskeletal: Negative for back pain, gait problem, neck pain and neck stiffness.   Skin: Negative for itching and rash.  Neurological: Negative for dizziness, extremity weakness, gait problem, headaches, light-headedness and seizures.  Hematological: Negative for adenopathy. Does not bruise/bleed easily.  Psychiatric/Behavioral: Negative for confusion, depression and sleep disturbance. The patient is not nervous/anxious.     PHYSICAL EXAMINATION:  There were no vitals taken for this visit.  ECOG PERFORMANCE STATUS: {CHL ONC ECOG [id number]  Physical Exam  Constitutional: Oriented to person,  place, and time and well-developed, well-nourished, and in no distress. No distress.  HENT:  Head: Normocephalic and atraumatic.  Mouth/Throat: Oropharynx is clear and moist. No oropharyngeal exudate.  Eyes: Conjunctivae are normal. Right eye exhibits no discharge. Left eye exhibits no discharge. No scleral icterus.  Neck: Normal range of motion. Neck supple.  Cardiovascular: Normal rate, regular rhythm, normal heart sounds and intact distal pulses.   Pulmonary/Chest: Effort normal and breath sounds normal. No respiratory distress. No wheezes. No rales.  Abdominal: Soft. Bowel sounds are normal. Exhibits no distension and no mass. There is no tenderness.  Musculoskeletal: Normal range of motion. Exhibits no edema.  Lymphadenopathy:    No cervical adenopathy.  Neurological: Alert and oriented to person, place, and time. Exhibits normal muscle tone. Gait normal. Coordination normal.  Skin: Skin is warm and dry. No rash noted. Not diaphoretic. No erythema. No pallor.  Psychiatric: Mood, memory and judgment normal.  Vitals reviewed.  LABORATORY DATA: Lab Results  Component Value Date   WBC 5.2 03/16/2022   HGB 10.4 (L) 03/16/2022   HCT 31.9 (L) 03/16/2022   MCV 88.4 03/16/2022   PLT 185 03/16/2022      Chemistry      Component Value Date/Time   NA 139 03/16/2022 1006   NA 140 03/04/2021 1516   K 3.4 (L) 03/16/2022 1006   CL 102 03/16/2022 1006   CO2 28 03/16/2022  1006   BUN 19 03/16/2022 1006   BUN 21 03/04/2021 1516   CREATININE 1.21 (H) 03/16/2022 1006      Component Value Date/Time   CALCIUM 9.3 03/16/2022 1006   ALKPHOS 71 03/16/2022 1006   AST 40 03/16/2022 1006   ALT 42 03/16/2022 1006   BILITOT 0.2 (L) 03/16/2022 1006       RADIOGRAPHIC STUDIES:  CT Chest W Contrast  Result Date: 04/04/2022 CLINICAL DATA:  A 71 year old female presents for follow-up of non-small cell lung cancer. * Tracking Code: BO * EXAM: CT CHEST, ABDOMEN, AND PELVIS WITH CONTRAST TECHNIQUE: Multidetector CT imaging of the chest, abdomen and pelvis was performed following the standard protocol during bolus administration of intravenous contrast. RADIATION DOSE REDUCTION: This exam was performed according to the departmental dose-optimization program which includes automated exposure control, adjustment of the mA and/or kV according to patient size and/or use of iterative reconstruction technique. CONTRAST:  58mL OMNIPAQUE IOHEXOL 300 MG/ML  SOLN COMPARISON:  January 10, 2022 FINDINGS: CT CHEST FINDINGS Cardiovascular: RIGHT-sided Port-A-Cath terminates in the low superior vena cava. Aortic atherosclerosis both calcified and noncalcified in the thoracic aorta. Calcified coronary artery disease. No pericardial effusion. Mediastinum/Nodes: No discrete adenopathy in the chest. Mild esophageal thickening presumably post treatment related. Lungs/Pleura: Mild circumferential pleural thickening and small pleural effusion in the RIGHT chest, this is unchanged. Densely consolidated RIGHT upper lobe compatible with post treatment changes showing no change since previous imaging. Mild septal thickening throughout the RIGHT chest similarly stable. Airways to aerated portions of lung are patent. Musculoskeletal: No chest wall mass. CT ABDOMEN PELVIS FINDINGS Hepatobiliary: No focal, suspicious hepatic lesion. No pericholecystic stranding. No biliary duct dilation. Portal vein is patent.  Pancreas: Normal, without mass, inflammation or ductal dilatation. Spleen: Normal. Adrenals/Urinary Tract: Adrenal glands are normal. Renal cortical scarring. No hydronephrosis or perinephric stranding. No perivesical stranding or focal thickening of the urinary bladder. Stomach/Bowel: Stomach distended with ingested contents mixed with contrast media. No sign of small bowel obstruction. No stranding adjacent to stomach or small bowel. No acute bowel process. Stool  throughout much of the colon. Vascular/Lymphatic: Aortic atherosclerosis. No sign of aneurysm. Smooth contour of the IVC. There is no gastrohepatic or hepatoduodenal ligament lymphadenopathy. No retroperitoneal or mesenteric lymphadenopathy. No pelvic sidewall lymphadenopathy. Reproductive: Post hysterectomy. Other: No ascites. Musculoskeletal: No acute or destructive bone finding. Spinal degenerative changes. IMPRESSION: 1. Stable post treatment changes and chronic pleural thickening/pleural effusion in the RIGHT chest. 2. No new or progressive findings. 3. Calcified coronary artery disease. 4. Aortic atherosclerosis. 5. Renal cortical scarring. 6. Stomach is markedly distended, this may simply be due to timing post ingestion of recent medial and mixing with ingested oral contrast media. Correlate with any GI symptoms. Aortic Atherosclerosis (ICD10-I70.0). Electronically Signed   By: Zetta Bills M.D.   On: 04/04/2022 14:36   CT Abdomen Pelvis W Contrast  Result Date: 04/04/2022 CLINICAL DATA:  A 71 year old female presents for follow-up of non-small cell lung cancer. * Tracking Code: BO * EXAM: CT CHEST, ABDOMEN, AND PELVIS WITH CONTRAST TECHNIQUE: Multidetector CT imaging of the chest, abdomen and pelvis was performed following the standard protocol during bolus administration of intravenous contrast. RADIATION DOSE REDUCTION: This exam was performed according to the departmental dose-optimization program which includes automated exposure  control, adjustment of the mA and/or kV according to patient size and/or use of iterative reconstruction technique. CONTRAST:  57mL OMNIPAQUE IOHEXOL 300 MG/ML  SOLN COMPARISON:  January 10, 2022 FINDINGS: CT CHEST FINDINGS Cardiovascular: RIGHT-sided Port-A-Cath terminates in the low superior vena cava. Aortic atherosclerosis both calcified and noncalcified in the thoracic aorta. Calcified coronary artery disease. No pericardial effusion. Mediastinum/Nodes: No discrete adenopathy in the chest. Mild esophageal thickening presumably post treatment related. Lungs/Pleura: Mild circumferential pleural thickening and small pleural effusion in the RIGHT chest, this is unchanged. Densely consolidated RIGHT upper lobe compatible with post treatment changes showing no change since previous imaging. Mild septal thickening throughout the RIGHT chest similarly stable. Airways to aerated portions of lung are patent. Musculoskeletal: No chest wall mass. CT ABDOMEN PELVIS FINDINGS Hepatobiliary: No focal, suspicious hepatic lesion. No pericholecystic stranding. No biliary duct dilation. Portal vein is patent. Pancreas: Normal, without mass, inflammation or ductal dilatation. Spleen: Normal. Adrenals/Urinary Tract: Adrenal glands are normal. Renal cortical scarring. No hydronephrosis or perinephric stranding. No perivesical stranding or focal thickening of the urinary bladder. Stomach/Bowel: Stomach distended with ingested contents mixed with contrast media. No sign of small bowel obstruction. No stranding adjacent to stomach or small bowel. No acute bowel process. Stool throughout much of the colon. Vascular/Lymphatic: Aortic atherosclerosis. No sign of aneurysm. Smooth contour of the IVC. There is no gastrohepatic or hepatoduodenal ligament lymphadenopathy. No retroperitoneal or mesenteric lymphadenopathy. No pelvic sidewall lymphadenopathy. Reproductive: Post hysterectomy. Other: No ascites. Musculoskeletal: No acute or destructive  bone finding. Spinal degenerative changes. IMPRESSION: 1. Stable post treatment changes and chronic pleural thickening/pleural effusion in the RIGHT chest. 2. No new or progressive findings. 3. Calcified coronary artery disease. 4. Aortic atherosclerosis. 5. Renal cortical scarring. 6. Stomach is markedly distended, this may simply be due to timing post ingestion of recent medial and mixing with ingested oral contrast media. Correlate with any GI symptoms. Aortic Atherosclerosis (ICD10-I70.0). Electronically Signed   By: Zetta Bills M.D.   On: 04/04/2022 14:36     ASSESSMENT/PLAN:  This is a very pleasant 71 year old never smoker African-American female diagnosed with stage IV non-small cell lung cancer, adenocarcinoma.  She was positive for an EGFR mutation with deletion in exon 19.  She was diagnosed in July 2019 and presented with right  upper lobe lung mass in addition to mediastinal lymphadenopathy as well as bilateral pulmonary nodules and malignant right pleural effusion.   The patient was started on treatment with Tagrisso 80 mg p.o. daily status post 39 months of treatment. Started on 01/29/2018.   In April 2022, she showed evidence of disease progression with interval progression of the right apical lung mass in addition to progression of mediastinal lymphadenopathy concerning for worsening of her disease. She had repeat Guardant 360 molecular studies which did not show evidence of new resistant mutations.  Therefore, she was referred to radiation oncology and completed palliative radiotherapy to the enlarging right upper lobe lung mass and mediastinum under the care of Dr. Sondra Come.  This was completed in July 2022.  Unfortunately, the patient recently was found to have evidence of disease progression.  Therefore, Dr. Julien Nordmann started the patient on systemic chemotherapy with carboplatin for AUC of 5 and Alimta 500 mg per metered squared.  She is status post 18 cycles and she tolerated it fairly  well despite her ongoing issues with fatigue, chest tightness, cough, and weight loss.  Alimta was reduced to 400 mg per metered squared due to renal insufficiency.  She is not a good candidate for Avastin due to her recent CVA in August 2022.  She also is continuing to take Tagrisso as it is protective against progressive metastatic disease to the brain. Starting from cycle #7,  she started maintenance single agent alimta.    The patient underwent a repeat bronchoscopy and biopsy for repeat molecular testing.  The sample from the left and right lung biopsy was negative for malignancy. This was performed in November 2022.   The patient saw Dr. Durenda Hurt from [facility] for second opinion in November 2022.  They discussed if she progresses on chemotherapy that there may be upcoming options. He discussed other treatment options which may be available in 2023 including patritumab deruxtcan and lazertinib/amivantmab. If these are not available, he wrote that they can determine if they can get the agents through an expanded access, single patient IND, or clinical trial if she has progression with chemotherapy.   The patient recently had a restaging CT scan performed.  The patient was seen with Dr. Julien Nordmann today.  Dr. Julien Nordmann personally independently reviewed the scan and discussed the results with the patient today.  The scan showed no evidence of disease progression.  Dr. Julien Nordmann recommends that she continue on the same treatment at the same dose.  She will proceed with cycle #19 today as scheduled.  We will see her back for follow-up visit in 3 weeks for evaluation and repeat blood work before undergoing cycle #20.   Labs were reviewed. Recommend she proceed with cycle #18 today as scheduled.    Integra plus.   Continue to follow with palliative care.   The patient was advised to call immediately if she has any concerning symptoms in the interval. The patient voices understanding of current  disease status and treatment options and is in agreement with the current care plan. All questions were answered. The patient knows to call the clinic with any problems, questions or concerns. We can certainly see the patient much sooner if necessary            No orders of the defined types were placed in this encounter.    I spent {CHL ONC TIME VISIT - [id number]} counseling the patient face to face. The total time spent in the appointment was {CHL ONC TIME  VISIT - [id number]}.  Mishayla Sliwinski L Baljit Liebert, PA-C 04/04/22

## 2022-04-06 ENCOUNTER — Inpatient Hospital Stay: Payer: Medicare HMO

## 2022-04-06 ENCOUNTER — Other Ambulatory Visit: Payer: Medicare HMO

## 2022-04-06 ENCOUNTER — Inpatient Hospital Stay: Payer: Medicare HMO | Admitting: Physician Assistant

## 2022-04-06 VITALS — BP 159/57 | HR 70 | Temp 98.6°F | Resp 18 | Ht 64.0 in | Wt 124.0 lb

## 2022-04-06 DIAGNOSIS — C3411 Malignant neoplasm of upper lobe, right bronchus or lung: Secondary | ICD-10-CM | POA: Diagnosis present

## 2022-04-06 DIAGNOSIS — Z5111 Encounter for antineoplastic chemotherapy: Secondary | ICD-10-CM

## 2022-04-06 DIAGNOSIS — I10 Essential (primary) hypertension: Secondary | ICD-10-CM | POA: Diagnosis not present

## 2022-04-06 DIAGNOSIS — I2510 Atherosclerotic heart disease of native coronary artery without angina pectoris: Secondary | ICD-10-CM | POA: Diagnosis not present

## 2022-04-06 DIAGNOSIS — Z79899 Other long term (current) drug therapy: Secondary | ICD-10-CM | POA: Diagnosis not present

## 2022-04-06 DIAGNOSIS — C7931 Secondary malignant neoplasm of brain: Secondary | ICD-10-CM | POA: Diagnosis not present

## 2022-04-06 DIAGNOSIS — C3491 Malignant neoplasm of unspecified part of right bronchus or lung: Secondary | ICD-10-CM

## 2022-04-06 DIAGNOSIS — E876 Hypokalemia: Secondary | ICD-10-CM | POA: Diagnosis not present

## 2022-04-06 DIAGNOSIS — K219 Gastro-esophageal reflux disease without esophagitis: Secondary | ICD-10-CM | POA: Diagnosis not present

## 2022-04-06 DIAGNOSIS — F419 Anxiety disorder, unspecified: Secondary | ICD-10-CM | POA: Diagnosis not present

## 2022-04-06 LAB — CMP (CANCER CENTER ONLY)
ALT: 42 U/L (ref 0–44)
AST: 37 U/L (ref 15–41)
Albumin: 3.8 g/dL (ref 3.5–5.0)
Alkaline Phosphatase: 65 U/L (ref 38–126)
Anion gap: 8 (ref 5–15)
BUN: 18 mg/dL (ref 8–23)
CO2: 27 mmol/L (ref 22–32)
Calcium: 9.4 mg/dL (ref 8.9–10.3)
Chloride: 107 mmol/L (ref 98–111)
Creatinine: 1.21 mg/dL — ABNORMAL HIGH (ref 0.44–1.00)
GFR, Estimated: 48 mL/min — ABNORMAL LOW (ref >60)
Glucose, Bld: 100 mg/dL — ABNORMAL HIGH (ref 70–99)
Potassium: 3.1 mmol/L — ABNORMAL LOW (ref 3.5–5.1)
Sodium: 142 mmol/L (ref 135–145)
Total Bilirubin: 0.2 mg/dL — ABNORMAL LOW (ref 0.3–1.2)
Total Protein: 6.9 g/dL (ref 6.5–8.1)

## 2022-04-06 LAB — CBC WITH DIFFERENTIAL (CANCER CENTER ONLY)
Abs Immature Granulocytes: 0.01 10*3/uL (ref 0.00–0.07)
Basophils Absolute: 0 10*3/uL (ref 0.0–0.1)
Basophils Relative: 1 %
Eosinophils Absolute: 0 10*3/uL (ref 0.0–0.5)
Eosinophils Relative: 1 %
HCT: 31.1 % — ABNORMAL LOW (ref 36.0–46.0)
Hemoglobin: 10.1 g/dL — ABNORMAL LOW (ref 12.0–15.0)
Immature Granulocytes: 0 %
Lymphocytes Relative: 19 %
Lymphs Abs: 0.8 10*3/uL (ref 0.7–4.0)
MCH: 28.5 pg (ref 26.0–34.0)
MCHC: 32.5 g/dL (ref 30.0–36.0)
MCV: 87.6 fL (ref 80.0–100.0)
Monocytes Absolute: 0.6 10*3/uL (ref 0.1–1.0)
Monocytes Relative: 14 %
Neutro Abs: 2.6 10*3/uL (ref 1.7–7.7)
Neutrophils Relative %: 65 %
Platelet Count: 196 10*3/uL (ref 150–400)
RBC: 3.55 MIL/uL — ABNORMAL LOW (ref 3.87–5.11)
RDW: 15 % (ref 11.5–15.5)
WBC Count: 4 10*3/uL (ref 4.0–10.5)
nRBC: 0 % (ref 0.0–0.2)

## 2022-04-06 MED ORDER — SODIUM CHLORIDE 0.9 % IV SOLN: Freq: Once | INTRAVENOUS | Status: AC

## 2022-04-06 MED ORDER — SODIUM CHLORIDE 0.9 % IV SOLN
400.0000 mg/m2 | Freq: Once | INTRAVENOUS | Status: AC
  Administered 2022-04-06: 600 mg via INTRAVENOUS
  Filled 2022-04-06: qty 20

## 2022-04-06 MED ORDER — HEPARIN SOD (PORK) LOCK FLUSH 100 UNIT/ML IV SOLN
500.0000 [IU] | Freq: Once | INTRAVENOUS | Status: AC | PRN
  Administered 2022-04-06: 500 [IU]

## 2022-04-06 MED ORDER — FOLIC ACID 1 MG PO TABS: 1.0000 mg | ORAL_TABLET | Freq: Every day | ORAL | 1 refills | Status: DC

## 2022-04-06 MED ORDER — ONDANSETRON HCL 4 MG/2ML IJ SOLN
8.0000 mg | Freq: Once | INTRAMUSCULAR | Status: AC
  Administered 2022-04-06: 8 mg via INTRAVENOUS
  Filled 2022-04-06: qty 4

## 2022-04-06 MED ORDER — POTASSIUM CHLORIDE CRYS ER 20 MEQ PO TBCR: 20.0000 meq | EXTENDED_RELEASE_TABLET | Freq: Every day | ORAL | 0 refills | Status: DC

## 2022-04-06 MED ORDER — SODIUM CHLORIDE 0.9% FLUSH
10.0000 mL | INTRAVENOUS | Status: DC | PRN
  Administered 2022-04-06: 10 mL

## 2022-04-07 NOTE — Telephone Encounter (Signed)
Oral Oncology Patient Advocate Encounter   Application submitted via e-fax to [phone]   [employer] phone number [phone].   I will continue to check the status until final determination.   Lady Deutscher, CPhT-Adv Oncology Pharmacy Patient [place] Direct Number: [phone]  Fax: [phone]

## 2022-04-10 ENCOUNTER — Telehealth: Payer: Self-pay

## 2022-04-10 NOTE — Telephone Encounter (Signed)
Allison [name], counseling intern, called patient to reschedule appointment after the counselor noticed a scheduling conflict in the counselor's schedule.   The patient expressed they were feeling tired after chemo. The counselor said they would call the patient back again on Wednesday.   Allison [name],  Counseling Intern  [phone]

## 2022-04-10 NOTE — Telephone Encounter (Signed)
Oral Oncology Patient Advocate Encounter   Received notification re-enrollment for assistance for Tagrisso through [employer] has been approved. Patient may continue to receive their medication at $0 from this program.    [employer] phone number [phone].   Effective dates: 06/19/22 through 06/19/23  I have spoken to the patient.  Allison [name], CPhT-Adv Oncology Pharmacy Patient [place] Direct Number: [phone]  Fax: [phone]

## 2022-04-13 NOTE — Progress Notes (Signed)
Allison [name], counseling intern, met with patient for their scheduled counseling session.   The patient spoke about the overwhelming feeling of anxiety when they are trying to attend to multiple tasks. The patient explained that when they were working, they could push things off to later or tomorrow. But now, the patient doesn't know if tomorrow will come. The patient explained anxiety comes and disrupts her peaceful state saying "Do it now. Tomorrow won't come."   The counselor asked the patient to explain anxiety. The patient painted a visual picture of a beautiful garden and said anxiety was a dark mass that took over the space. The counselor asked the patient if this week, she could practice asking anxiety to move over and to leave the space.   The patient scheduled their next counseling session for Monday, November 6th at 12:30pm via [place].

## 2022-04-18 ENCOUNTER — Other Ambulatory Visit: Payer: Self-pay | Admitting: Physician Assistant

## 2022-04-18 DIAGNOSIS — D509 Iron deficiency anemia, unspecified: Secondary | ICD-10-CM

## 2022-04-22 ENCOUNTER — Encounter: Payer: Self-pay | Admitting: Cardiology

## 2022-04-24 NOTE — Progress Notes (Signed)
Allison [name], counseling intern, met with patient for their scheduled counseling session.   Patient reported they have been practicing allowing anxiety to "come for a visit" then asking anxiety permission to leave.   The patient shared their cycle of "good week, chemo, bad week." The counselor asked the patient if we could change the labels of "good week" and "bad week" to something other than good or bad to not put too much pressure on the client for the "good week" and not prematurely create anxiety in the patient as the "bad week" approaches. The patient said they would spend the week thinking about options. The patient also said they would begin to think of exhaustion as "just exhaustion."   The patient scheduled their next counseling session for Thursday, November 27th at 2pm via Allison [name].   Allison [name], Counseling Intern  [phone] [email or web address]

## 2022-04-25 NOTE — Progress Notes (Unsigned)
[facility] OFFICE PROGRESS NOTE  [name], [place] [address]  DIAGNOSIS: Stage IV (T2 a,N2, M1a) non-small cell lung cancer, adenocarcinoma diagnosed in July 2019 and presented with right upper lobe lung mass in addition to mediastinal lymphadenopathy as well as bilateral pulmonary nodules and malignant right pleural effusion.   Biomarker Findings Microsatellite status - Cannot Be Determined Tumor Mutational Burden - Cannot Be Determined Genomic Findings For a complete list of the genes assayed, please refer to the Appendix. EGFR exon 19 deletion ([id number]) TP53 Y220C 7 Disease relevant genes with no reportable alterations: KRAS, ALK, BRAF, MET, RET, ERBB2, ROS1   PRIOR THERAPY: 1) Status post right Pleurx catheter placement by Dr. Prescott Gum for drainage of malignant right pleural effusion. 2) palliative radiotherapy to the enlarging right upper lobe lung mass and mediastinum under the care of Dr. Sondra Come expected to be completed on January 05, 2021. 3) Tagrisso 80 mg p.o. daily.  First dose was given on 01/29/2018.  Status post 39 months of treatment.  CURRENT THERAPY: Systemic chemotherapy with carboplatin for AUC of 5 and Alimta 500 Mg/M2 every 3 weeks.  First dose 03/17/2021.  The patient will also continue her current treatment with Tagrisso 80 mg p.o. daily.  She is status post 19 cycles. Starting from cycle #7, she will be on Alimta only 500 mg/m2.  Alimta was eventually reduced to 400 mg per metered squared due to renal insufficiency   INTERVAL HISTORY: Allison [name] 71 y.o. female returns to the clinic today for a follow-up visit accompanied by her boyfriend.  The patient is currently undergoing single agent dose reduced Alimta IV every 3 weeks.  The dose of Alimta was reduced due to renal insufficiency.  She reported symptoms of fatigue, stomach aches/nausea/ heart burn beginning about 3 days after treatment and ending  after 4 days. Overall, she is able to manage her symptoms but she is asking for more clarification how to take her anti-emetics.   ince last being seen, the patient denies any changes in her health except she is having some bilateral ankle swelling. She reached out to her cardiologist who recommended compression stockings and elevation. Elevation dose help her mild swelling and her swelling is generally better in the morning. Of note, she is also on Norvasc. She denies erythema or calf pain. She just reports her skin feels tight by the end of the day if she has swelling. She stated if her swelling does not improve, then her cardiologist may consider lasix.  She follows closely with palliative care and overall many of her symptoms have improved.  Her anxiety is controlled. She has been talking to the counselor here at the cancer center about her anxiety which has been helpful for her.   She denies any recent fever, chills, although she does note she is "cold natured". Of note, she has stable anemia. She is wondering if she still needs to be taking a B complex vitamin. She reports continued stable/unchanged night sweats. She denies any changes with her breathing such as new dyspnea on exertion or changes in her chronic cough which is a dry cough.  She denies any hemoptysis.  She reports some chest tightness when phlegm is produced. She denies sharp chest pain. She uses Tylenol for chest tightness and she uses a vibrating vest to help break up Mucinex secretions for her chronic cough.  She follows with Dr. Valeta Harms from [facility] pulmonary medicine.  The patient denies any nausea or vomiting although she has had some indigestion and belching for which she takes Pepcid. Pepcid has been working for her.  She also takes levin for abdominal cramping.  She had a bout of diarrhea recently which she attributes to eating too much junk food recently. She had about 3 episodes per day the last few days. She has not needed  imodium She reports her appetite is fine.  She continues to take her iron supplement.  She denies any headache or visual changes.     She is here today for evaluation before undergoing cycle #20.     MEDICAL HISTORY: Past Medical History:  Diagnosis Date   Anemia    Anxiety    Arthritis    Asthma    exercise induced   Depression    PMH   Dyspnea    GERD (gastroesophageal reflux disease)    Glaucoma    History of radiation therapy 01/05/2021   IMRT right lung  11/24/2020-01/05/2021  Dr [name]   Hypertension    lung ca dx'd 11/2017   right   Malignant pleural effusion    right   Nuclear sclerotic cataract of right eye 02/28/2021   Dr. Warden Fillers, cataract surgery February 2023   PONV (postoperative nausea and vomiting)    Pre-diabetes    Raynaud's disease    Raynaud's disease    Stroke ([place]) 01/2021   balance off, some express aphasia, weakness    ALLERGIES:  is allergic to hydrocodone-acetaminophen, penicillins, vicodin [hydrocodone-acetaminophen], and other.  MEDICATIONS:  Current Outpatient Medications  Medication Sig Dispense Refill   acetaminophen (TYLENOL) 500 MG tablet Take 1,000 mg by mouth every 6 (six) hours as needed (pain).     ALPRAZolam (XANAX) 0.25 MG tablet Take 1 tablet (0.25 mg total) by mouth 2 (two) times daily as needed for anxiety. May take 2 tablets (0.5 mg total) at bedtime. 60 tablet 0   amLODipine (NORVASC) 5 MG tablet Take 5 mg by mouth daily.     brompheniramine-pseudoephedrine-DM 30-2-10 MG/5ML syrup Take 5 mLs by mouth 3 (three) times daily as needed. 120 mL 0   carvedilol (COREG) 12.5 MG tablet TAKE 1 TABLET BY MOUTH 2 TIMES DAILY. 180 tablet 1   cetirizine (ZYRTEC) 5 MG tablet Take 5 mg by mouth daily.     dexamethasone (DECADRON) 4 MG tablet Take 1 tablet twice a day the day before, the day of, and the day after chemotherapy 40 tablet 2   docusate sodium (COLACE) 100 MG capsule Take 1 capsule (100 mg total) by mouth every other  day. (Patient not taking: Reported on 04/06/2022) 15 capsule 5   dorzolamide-timolol (COSOPT) 22.3-6.8 MG/ML ophthalmic solution Place 1 drop into both eyes 2 (two) times daily.     dronabinol (MARINOL) 2.5 MG capsule Take 1 capsule (2.5 mg total) by mouth 2 (two) times daily as needed (decreased appetite). (Patient not taking: Reported on 03/09/2022) 60 capsule 0   enoxaparin (LOVENOX) 60 MG/0.6ML injection Inject 0.6 mLs (60 mg total) into the skin every 12 (twelve) hours. (Patient taking differently: Inject 80 mg into the skin daily. 80 mg) 60 mL 1   esomeprazole (NEXIUM) 20 MG capsule Take 1 capsule (20 mg total) by mouth in the morning and at bedtime. 60 capsule 2   ezetimibe (ZETIA) 10 MG tablet Take 10 mg by mouth daily.     FeFum-FePoly-FA-B Cmp-C-Biot (FOLIVANE-PLUS) CAPS TAKE 1 CAPSULE BY MOUTH EVERY DAY IN THE MORNING 90  capsule 0   FLUoxetine (PROZAC) 20 MG capsule Take 1 capsule (20 mg total) by mouth daily. 90 capsule 3   folic acid (FOLVITE) 1 MG tablet Take 1 tablet (1 mg total) by mouth daily. 90 tablet 1   hyoscyamine (LEVSIN SL) 0.125 MG SL tablet Place 2 tablets (0.25 mg total) under the tongue every 6 (six) hours as needed for cramping. Take 1-2 tablets under the tongue every 6 hours as needed for cramping 30 tablet 0   ipratropium-albuterol (DUONEB) 0.5-2.5 (3) MG/3ML SOLN Take 3 mLs by nebulization every 6 (six) hours as needed (Asthma).     isosorbide mononitrate (IMDUR) 30 MG 24 hr tablet Take 30 mg by mouth daily.     latanoprost (XALATAN) 0.005 % ophthalmic solution Place 1 drop into both eyes at bedtime.      ondansetron (ZOFRAN-ODT) 4 MG disintegrating tablet Take 1 tablet (4 mg total) by mouth every 8 (eight) hours as needed for nausea or vomiting. 30 tablet 1   osimertinib mesylate (TAGRISSO) 80 MG tablet Take 1 tablet (80 mg total) by mouth daily. 30 tablet 3   potassium chloride SA (KLOR-CON M) 20 MEQ tablet Take 1 tablet (20 mEq total) by mouth daily. 7 tablet 0    REPATHA SURECLICK 751 MG/ML SOAJ INJECT 140 MG INTO THE SKIN EVERY 14 (FOURTEEN) DAYS. 6 mL 3   valACYclovir (VALTREX) 500 MG tablet Take 1 tablet twice a day by oral route for 5 days.     No current facility-administered medications for this visit.    SURGICAL HISTORY:  Past Surgical History:  Procedure Laterality Date   ABDOMINAL HYSTERECTOMY     partial   BRONCHIAL BIOPSY  04/21/2021   Procedure: BRONCHIAL BIOPSIES;  Surgeon: [name], DO;  Location: [facility];  Service: Pulmonary;;   BRONCHIAL BRUSHINGS  04/21/2021   Procedure: BRONCHIAL BRUSHINGS;  Surgeon: [name], DO;  Location: [facility];  Service: Pulmonary;;   BRONCHIAL NEEDLE ASPIRATION BIOPSY  04/21/2021   Procedure: BRONCHIAL NEEDLE ASPIRATION BIOPSIES;  Surgeon: [name], DO;  Location: [facility];  Service: Pulmonary;;   CHEST TUBE INSERTION Right 01/01/2018   Procedure: INSERTION PLEURAL DRAINAGE CATHETER;  Surgeon: Ivin Poot, MD;  Location: [facility];  Service: Thoracic;  Laterality: Right;   CHEST TUBE INSERTION  04/21/2021   Procedure: CHEST TUBE INSERTION;  Surgeon: [name], DO;  Location: [facility];  Service: Pulmonary;;   COLONOSCOPY     CORONARY STENT INTERVENTION N/A 06/16/2019   Procedure: CORONARY STENT INTERVENTION;  Surgeon: Jettie Booze, MD;  Location: [facility];  Service: Cardiovascular;  Laterality: N/A;   DILATION AND CURETTAGE OF UTERUS     EYE SURGERY     due to Glaucoma   IR IMAGING GUIDED PORT INSERTION  03/22/2021   IR PORT REPAIR CENTRAL VENOUS ACCESS DEVICE  04/15/2021   LEFT HEART CATH AND CORONARY ANGIOGRAPHY N/A 06/16/2019   Procedure: LEFT HEART CATH AND CORONARY ANGIOGRAPHY;  Surgeon: Jettie Booze, MD;  Location: [facility];  Service: Cardiovascular;  Laterality: N/A;   REMOVAL OF PLEURAL DRAINAGE CATHETER Right 11/07/2018   Procedure: REMOVAL OF PLEURAL DRAINAGE CATHETER;  Surgeon: Ivin Poot, MD;  Location: [facility];  Service: Thoracic;  Laterality: Right;   ROTATOR CUFF REPAIR     TUBAL LIGATION     VIDEO BRONCHOSCOPY WITH ENDOBRONCHIAL NAVIGATION Bilateral 04/21/2021   Procedure: VIDEO BRONCHOSCOPY WITH ENDOBRONCHIAL NAVIGATION;  Surgeon: [name],  DO;  Location: [facility];  Service: Pulmonary;  Laterality: Bilateral;  ION   WISDOM TOOTH EXTRACTION      REVIEW OF SYSTEMS:   Constitutional: Positive for fatigue with overactivity.  Lost 1 lb since last being seen. Negative for appetite change, chills, and fever. HENT: Negative for mouth sores, nosebleeds, sore throat and trouble swallowing.   Eyes: Negative for eye problems and icterus.  Respiratory: Positive for stable dyspnea with overactivity.  Positive chronic cough. Negative for hemoptysis and wheezing.   Cardiovascular: Negative for chest pain. Positive for mild ankle swelling.  Gastrointestinal: Positive for indigestion. Intermittent diarrhea or constipation. Positive for occasional abdominal cramping. Genitourinary: Negative for bladder incontinence, difficulty urinating, dysuria, frequency and hematuria.   Musculoskeletal: Negative for back pain, gait problem, neck pain and neck stiffness.  Skin: Negative for itching and rash.  Neurological: Negative for dizziness, extremity weakness, gait problem, headaches, light-headedness and seizures.  Hematological: Negative for adenopathy. Does not bruise/bleed easily.  Psychiatric/Behavioral: Negative for confusion, depression and sleep disturbance. The patient is not nervous/anxious.     PHYSICAL EXAMINATION:  There were no vitals taken for this visit.  ECOG PERFORMANCE STATUS: 1  Physical Exam  Constitutional: Oriented to person, place, and time and well-developed, well-nourished, and in no distress.  HENT:  Head: Normocephalic and atraumatic.  Mouth/Throat: Oropharynx is clear and moist. No oropharyngeal exudate.  Eyes: Conjunctivae are normal. Right eye exhibits no discharge.  Left eye exhibits no discharge. No scleral icterus.  Neck: Normal range of motion. Neck supple.  Cardiovascular: Normal rate, regular rhythm, normal heart sounds.   Pulmonary/Chest: Effort normal and breath sounds normal. No respiratory distress. No wheezes. No rales.  Abdominal: Exhibits no distension Musculoskeletal: Normal range of motion. Mild bilateral ankle edema.  Lymphadenopathy:    No cervical adenopathy.  Neurological: Alert and oriented to person, place, and time. Coordination normal. Uses walker  Skin: Skin is warm and dry. No rash noted. Not diaphoretic. No erythema. No pallor.  Psychiatric: Mood, memory and judgment normal.  Vitals reviewed.  LABORATORY DATA: Lab Results  Component Value Date   WBC 4.0 04/06/2022   HGB 10.1 (L) 04/06/2022   HCT 31.1 (L) 04/06/2022   MCV 87.6 04/06/2022   PLT 196 04/06/2022      Chemistry      Component Value Date/Time   NA 142 04/06/2022 0956   NA 140 03/04/2021 1516   K 3.1 (L) 04/06/2022 0956   CL 107 04/06/2022 0956   CO2 27 04/06/2022 0956   BUN 18 04/06/2022 0956   BUN 21 03/04/2021 1516   CREATININE 1.21 (H) 04/06/2022 0956      Component Value Date/Time   CALCIUM 9.4 04/06/2022 0956   ALKPHOS 65 04/06/2022 0956   AST 37 04/06/2022 0956   ALT 42 04/06/2022 0956   BILITOT 0.2 (L) 04/06/2022 0956       RADIOGRAPHIC STUDIES:  CT Chest W Contrast  Result Date: 04/04/2022 CLINICAL DATA:  A 71 year old female presents for follow-up of non-small cell lung cancer. * Tracking Code: BO * EXAM: CT CHEST, ABDOMEN, AND PELVIS WITH CONTRAST TECHNIQUE: Multidetector CT imaging of the chest, abdomen and pelvis was performed following the standard protocol during bolus administration of intravenous contrast. RADIATION DOSE REDUCTION: This exam was performed according to the departmental dose-optimization program which includes automated exposure control, adjustment of the mA and/or kV according to patient size and/or use of  iterative reconstruction technique. CONTRAST:  4m OMNIPAQUE IOHEXOL 300 MG/ML  SOLN COMPARISON:  January 10, 2022 FINDINGS: CT CHEST FINDINGS Cardiovascular: RIGHT-sided Port-A-Cath terminates in the low superior vena cava. Aortic atherosclerosis both calcified and noncalcified in the thoracic aorta. Calcified coronary artery disease. No pericardial effusion. Mediastinum/Nodes: No discrete adenopathy in the chest. Mild esophageal thickening presumably post treatment related. Lungs/Pleura: Mild circumferential pleural thickening and small pleural effusion in the RIGHT chest, this is unchanged. Densely consolidated RIGHT upper lobe compatible with post treatment changes showing no change since previous imaging. Mild septal thickening throughout the RIGHT chest similarly stable. Airways to aerated portions of lung are patent. Musculoskeletal: No chest wall mass. CT ABDOMEN PELVIS FINDINGS Hepatobiliary: No focal, suspicious hepatic lesion. No pericholecystic stranding. No biliary duct dilation. Portal vein is patent. Pancreas: Normal, without mass, inflammation or ductal dilatation. Spleen: Normal. Adrenals/Urinary Tract: Adrenal glands are normal. Renal cortical scarring. No hydronephrosis or perinephric stranding. No perivesical stranding or focal thickening of the urinary bladder. Stomach/Bowel: Stomach distended with ingested contents mixed with contrast media. No sign of small bowel obstruction. No stranding adjacent to stomach or small bowel. No acute bowel process. Stool throughout much of the colon. Vascular/Lymphatic: Aortic atherosclerosis. No sign of aneurysm. Smooth contour of the IVC. There is no gastrohepatic or hepatoduodenal ligament lymphadenopathy. No retroperitoneal or mesenteric lymphadenopathy. No pelvic sidewall lymphadenopathy. Reproductive: Post hysterectomy. Other: No ascites. Musculoskeletal: No acute or destructive bone finding. Spinal degenerative changes. IMPRESSION: 1. Stable post treatment  changes and chronic pleural thickening/pleural effusion in the RIGHT chest. 2. No new or progressive findings. 3. Calcified coronary artery disease. 4. Aortic atherosclerosis. 5. Renal cortical scarring. 6. Stomach is markedly distended, this may simply be due to timing post ingestion of recent medial and mixing with ingested oral contrast media. Correlate with any GI symptoms. Aortic Atherosclerosis (ICD10-I70.0). Electronically Signed   By: Zetta Bills M.D.   On: 04/04/2022 14:36   CT Abdomen Pelvis W Contrast  Result Date: 04/04/2022 CLINICAL DATA:  A 71 year old female presents for follow-up of non-small cell lung cancer. * Tracking Code: BO * EXAM: CT CHEST, ABDOMEN, AND PELVIS WITH CONTRAST TECHNIQUE: Multidetector CT imaging of the chest, abdomen and pelvis was performed following the standard protocol during bolus administration of intravenous contrast. RADIATION DOSE REDUCTION: This exam was performed according to the departmental dose-optimization program which includes automated exposure control, adjustment of the mA and/or kV according to patient size and/or use of iterative reconstruction technique. CONTRAST:  26m OMNIPAQUE IOHEXOL 300 MG/ML  SOLN COMPARISON:  January 10, 2022 FINDINGS: CT CHEST FINDINGS Cardiovascular: RIGHT-sided Port-A-Cath terminates in the low superior vena cava. Aortic atherosclerosis both calcified and noncalcified in the thoracic aorta. Calcified coronary artery disease. No pericardial effusion. Mediastinum/Nodes: No discrete adenopathy in the chest. Mild esophageal thickening presumably post treatment related. Lungs/Pleura: Mild circumferential pleural thickening and small pleural effusion in the RIGHT chest, this is unchanged. Densely consolidated RIGHT upper lobe compatible with post treatment changes showing no change since previous imaging. Mild septal thickening throughout the RIGHT chest similarly stable. Airways to aerated portions of lung are patent.  Musculoskeletal: No chest wall mass. CT ABDOMEN PELVIS FINDINGS Hepatobiliary: No focal, suspicious hepatic lesion. No pericholecystic stranding. No biliary duct dilation. Portal vein is patent. Pancreas: Normal, without mass, inflammation or ductal dilatation. Spleen: Normal. Adrenals/Urinary Tract: Adrenal glands are normal. Renal cortical scarring. No hydronephrosis or perinephric stranding. No perivesical stranding or focal thickening of the urinary bladder. Stomach/Bowel: Stomach distended with ingested contents mixed with contrast media. No sign of small bowel obstruction. No stranding adjacent to stomach  or small bowel. No acute bowel process. Stool throughout much of the colon. Vascular/Lymphatic: Aortic atherosclerosis. No sign of aneurysm. Smooth contour of the IVC. There is no gastrohepatic or hepatoduodenal ligament lymphadenopathy. No retroperitoneal or mesenteric lymphadenopathy. No pelvic sidewall lymphadenopathy. Reproductive: Post hysterectomy. Other: No ascites. Musculoskeletal: No acute or destructive bone finding. Spinal degenerative changes. IMPRESSION: 1. Stable post treatment changes and chronic pleural thickening/pleural effusion in the RIGHT chest. 2. No new or progressive findings. 3. Calcified coronary artery disease. 4. Aortic atherosclerosis. 5. Renal cortical scarring. 6. Stomach is markedly distended, this may simply be due to timing post ingestion of recent medial and mixing with ingested oral contrast media. Correlate with any GI symptoms. Aortic Atherosclerosis (ICD10-I70.0). Electronically Signed   By: Zetta Bills M.D.   On: 04/04/2022 14:36     ASSESSMENT/PLAN:  This is a very pleasant 71 year old never smoker African-American female diagnosed with stage IV non-small cell lung cancer, adenocarcinoma.  She was positive for an EGFR mutation with deletion in exon 19.  She was diagnosed in July 2019 and presented with right upper lobe lung mass in addition to mediastinal  lymphadenopathy as well as bilateral pulmonary nodules and malignant right pleural effusion.   The patient was started on treatment with Tagrisso 80 mg p.o. daily status post 39 months of treatment. Started on 01/29/2018.   In April 2022, she showed evidence of disease progression with interval progression of the right apical lung mass in addition to progression of mediastinal lymphadenopathy concerning for worsening of her disease. She had repeat Guardant 360 molecular studies which did not show evidence of new resistant mutations.  Therefore, she was referred to radiation oncology and completed palliative radiotherapy to the enlarging right upper lobe lung mass and mediastinum under the care of Dr. Sondra Come.  This was completed in July 2022.   Unfortunately, the patient recently was found to have evidence of disease progression.  Therefore, Dr. Julien Nordmann started the patient on systemic chemotherapy with carboplatin for AUC of 5 and Alimta 500 mg per metered squared.  She is status post 19 cycles and she tolerated it fairly well despite her ongoing issues with fatigue, chest tightness, cough, and weight loss.  Alimta was reduced to 400 mg per metered squared due to renal insufficiency.  She is not a good candidate for Avastin due to her recent CVA in August 2022.  She also is continuing to take Tagrisso as it is protective against progressive metastatic disease to the brain. Starting from cycle #7,  she started maintenance single agent alimta.   The patient underwent a repeat bronchoscopy and biopsy for repeat molecular testing.  The sample from the left and right lung biopsy was negative for malignancy. This was performed in November 2022.    The patient saw Dr. Durenda Hurt from [facility] for second opinion in November 2022.  They discussed if she progresses on chemotherapy that there may be upcoming options. He discussed other treatment options which may be available in 2023 including patritumab  deruxtcan and lazertinib/amivantmab. If these are not available, he wrote that they can determine if they can get the agents through an expanded access, single patient IND, or clinical trial if she has progression with chemotherapy.    Labs were reviewed.  Recommend that she proceed with cycle #20 today as scheduled.  We will see her back for follow-up visit in 3 weeks for evaluation repeat blood work before undergoing cycle #21.  I will ensure that her next lab appointment  is changed to lab and flush appointment.  She reports some queasiness on day 3 and 4 after treatment.  Discussed that she need to extend her Decadron she certainly can.  Reviewed Zofran can be taken every 8 hours.   We will continue taking Pepcid for her reflux.  Blood pressure is elevated today.  We will recheck her blood pressure in the infusion room.  She was also encouraged to monitor her blood pressure at home and if it continues to be out of recommended parameters to follow-up with her cardiologist for medication adjustment.  She does have some mild bilateral ankle swelling without any evidence of erythema or pain.  Discussed Norvasc could cause some lower extremity swelling.  Her swelling improves with elevation and wearing compression stockings.  She was encouraged to wear compression stockings and to elevate her lower extremities.  If her swelling worsens then she will reach out to her cardiologist to stated he may then consider her for Lasix.  He had some diarrhea a few days ago.  Discussed that she may take Imodium if needed.  Since patient is taking folic acid, receiving M46 injections every 9 weeks, and is taking Integra plus, discussed she does not need to take an additional B complex vitamin.  She will continue to follow with palliative care and  counseling.   The patient was advised to call immediately if she has any concerning symptoms in the interval. The patient voices understanding of current disease  status and treatment options and is in agreement with the current care plan. All questions were answered. The patient knows to call the clinic with any problems, questions or concerns. We can certainly see the patient much sooner if necessary            No orders of the defined types were placed in this encounter.     The total time spent in the appointment was 20-29 minutes.   Analilia Geddis L Marzell Isakson, PA-C 04/25/22

## 2022-04-26 ENCOUNTER — Encounter: Payer: Self-pay | Admitting: Internal Medicine

## 2022-04-27 ENCOUNTER — Encounter: Payer: Self-pay | Admitting: Nurse Practitioner

## 2022-04-27 ENCOUNTER — Inpatient Hospital Stay: Payer: Medicare HMO | Attending: Internal Medicine

## 2022-04-27 ENCOUNTER — Inpatient Hospital Stay (HOSPITAL_BASED_OUTPATIENT_CLINIC_OR_DEPARTMENT_OTHER): Payer: Medicare HMO | Admitting: Physician Assistant

## 2022-04-27 ENCOUNTER — Encounter: Payer: Self-pay | Admitting: General Practice

## 2022-04-27 ENCOUNTER — Inpatient Hospital Stay: Payer: Medicare HMO

## 2022-04-27 ENCOUNTER — Inpatient Hospital Stay (HOSPITAL_BASED_OUTPATIENT_CLINIC_OR_DEPARTMENT_OTHER): Payer: Medicare HMO | Admitting: Nurse Practitioner

## 2022-04-27 VITALS — BP 176/75 | HR 69 | Temp 97.7°F | Resp 17 | Wt 125.0 lb

## 2022-04-27 VITALS — BP 155/84

## 2022-04-27 DIAGNOSIS — Z5111 Encounter for antineoplastic chemotherapy: Secondary | ICD-10-CM | POA: Diagnosis not present

## 2022-04-27 DIAGNOSIS — C3411 Malignant neoplasm of upper lobe, right bronchus or lung: Secondary | ICD-10-CM | POA: Insufficient documentation

## 2022-04-27 DIAGNOSIS — K30 Functional dyspepsia: Secondary | ICD-10-CM | POA: Diagnosis not present

## 2022-04-27 DIAGNOSIS — Z515 Encounter for palliative care: Secondary | ICD-10-CM

## 2022-04-27 DIAGNOSIS — Z923 Personal history of irradiation: Secondary | ICD-10-CM | POA: Insufficient documentation

## 2022-04-27 DIAGNOSIS — R630 Anorexia: Secondary | ICD-10-CM

## 2022-04-27 DIAGNOSIS — C3491 Malignant neoplasm of unspecified part of right bronchus or lung: Secondary | ICD-10-CM

## 2022-04-27 DIAGNOSIS — K219 Gastro-esophageal reflux disease without esophagitis: Secondary | ICD-10-CM | POA: Diagnosis not present

## 2022-04-27 DIAGNOSIS — R110 Nausea: Secondary | ICD-10-CM

## 2022-04-27 DIAGNOSIS — Z8673 Personal history of transient ischemic attack (TIA), and cerebral infarction without residual deficits: Secondary | ICD-10-CM | POA: Diagnosis not present

## 2022-04-27 DIAGNOSIS — D649 Anemia, unspecified: Secondary | ICD-10-CM | POA: Insufficient documentation

## 2022-04-27 DIAGNOSIS — Z95828 Presence of other vascular implants and grafts: Secondary | ICD-10-CM

## 2022-04-27 DIAGNOSIS — M25472 Effusion, left ankle: Secondary | ICD-10-CM | POA: Diagnosis not present

## 2022-04-27 DIAGNOSIS — Z23 Encounter for immunization: Secondary | ICD-10-CM | POA: Insufficient documentation

## 2022-04-27 DIAGNOSIS — M25471 Effusion, right ankle: Secondary | ICD-10-CM | POA: Diagnosis not present

## 2022-04-27 DIAGNOSIS — R61 Generalized hyperhidrosis: Secondary | ICD-10-CM | POA: Diagnosis not present

## 2022-04-27 DIAGNOSIS — F419 Anxiety disorder, unspecified: Secondary | ICD-10-CM | POA: Insufficient documentation

## 2022-04-27 DIAGNOSIS — Z79899 Other long term (current) drug therapy: Secondary | ICD-10-CM | POA: Insufficient documentation

## 2022-04-27 LAB — CMP (CANCER CENTER ONLY)
ALT: 42 U/L (ref 0–44)
AST: 36 U/L (ref 15–41)
Albumin: 3.9 g/dL (ref 3.5–5.0)
Alkaline Phosphatase: 64 U/L (ref 38–126)
Anion gap: 6 (ref 5–15)
BUN: 16 mg/dL (ref 8–23)
CO2: 28 mmol/L (ref 22–32)
Calcium: 9.3 mg/dL (ref 8.9–10.3)
Chloride: 108 mmol/L (ref 98–111)
Creatinine: 1.18 mg/dL — ABNORMAL HIGH (ref 0.44–1.00)
GFR, Estimated: 49 mL/min — ABNORMAL LOW (ref >60)
Glucose, Bld: 89 mg/dL (ref 70–99)
Potassium: 3.4 mmol/L — ABNORMAL LOW (ref 3.5–5.1)
Sodium: 142 mmol/L (ref 135–145)
Total Bilirubin: 0.2 mg/dL — ABNORMAL LOW (ref 0.3–1.2)
Total Protein: 6.9 g/dL (ref 6.5–8.1)

## 2022-04-27 LAB — CBC WITH DIFFERENTIAL (CANCER CENTER ONLY)
Abs Immature Granulocytes: 0.01 10*3/uL (ref 0.00–0.07)
Basophils Absolute: 0 10*3/uL (ref 0.0–0.1)
Basophils Relative: 1 %
Eosinophils Absolute: 0 10*3/uL (ref 0.0–0.5)
Eosinophils Relative: 1 %
HCT: 31.6 % — ABNORMAL LOW (ref 36.0–46.0)
Hemoglobin: 10.3 g/dL — ABNORMAL LOW (ref 12.0–15.0)
Immature Granulocytes: 0 %
Lymphocytes Relative: 21 %
Lymphs Abs: 0.9 10*3/uL (ref 0.7–4.0)
MCH: 28.9 pg (ref 26.0–34.0)
MCHC: 32.6 g/dL (ref 30.0–36.0)
MCV: 88.5 fL (ref 80.0–100.0)
Monocytes Absolute: 0.6 10*3/uL (ref 0.1–1.0)
Monocytes Relative: 14 %
Neutro Abs: 2.7 10*3/uL (ref 1.7–7.7)
Neutrophils Relative %: 63 %
Platelet Count: 192 10*3/uL (ref 150–400)
RBC: 3.57 MIL/uL — ABNORMAL LOW (ref 3.87–5.11)
RDW: 14.6 % (ref 11.5–15.5)
WBC Count: 4.3 10*3/uL (ref 4.0–10.5)
nRBC: 0 % (ref 0.0–0.2)

## 2022-04-27 MED ORDER — SODIUM CHLORIDE 0.9% FLUSH
10.0000 mL | Freq: Once | INTRAVENOUS | Status: AC
  Administered 2022-04-27: 10 mL

## 2022-04-27 MED ORDER — HEPARIN SOD (PORK) LOCK FLUSH 100 UNIT/ML IV SOLN
500.0000 [IU] | Freq: Once | INTRAVENOUS | Status: AC | PRN
  Administered 2022-04-27: 500 [IU]

## 2022-04-27 MED ORDER — SODIUM CHLORIDE 0.9% FLUSH
10.0000 mL | INTRAVENOUS | Status: DC | PRN
  Administered 2022-04-27: 10 mL

## 2022-04-27 MED ORDER — INFLUENZA VAC A&B SA ADJ QUAD 0.5 ML IM PRSY
0.5000 mL | PREFILLED_SYRINGE | Freq: Once | INTRAMUSCULAR | Status: AC
  Administered 2022-04-27: 0.5 mL via INTRAMUSCULAR
  Filled 2022-04-27: qty 0.5

## 2022-04-27 MED ORDER — SODIUM CHLORIDE 0.9 % IV SOLN: Freq: Once | INTRAVENOUS | Status: AC

## 2022-04-27 MED ORDER — ONDANSETRON HCL 4 MG/2ML IJ SOLN
8.0000 mg | Freq: Once | INTRAMUSCULAR | Status: AC
  Administered 2022-04-27: 8 mg via INTRAVENOUS
  Filled 2022-04-27: qty 4

## 2022-04-27 MED ORDER — SODIUM CHLORIDE 0.9 % IV SOLN
400.0000 mg/m2 | Freq: Once | INTRAVENOUS | Status: AC
  Administered 2022-04-27: 600 mg via INTRAVENOUS
  Filled 2022-04-27: qty 20

## 2022-04-27 NOTE — Progress Notes (Signed)
Per Cassie H, PA-C ok to proceed with creatinine clearance 37.39ml/min

## 2022-04-27 NOTE — Progress Notes (Signed)
[facility]  [phone] [phone]   Name: Allison [name] Date: 04/27/2022 MRN: [id number]  DOB: 1950-09-09  Patient Care Team: [name], MD as PCP - General (Internal Medicine) Donato Heinz, MD as PCP - Cardiology (Cardiology) [name], MD as Consulting Physician (Radiation Oncology) Pickenpack-Cousar, Carlena Sax, NP as Nurse Practitioner (Nurse Practitioner) Valrie Hart, RN as Oncology Nurse Navigator (Oncology) Curt Bears, MD as Consulting Physician (Oncology) Gayland Curry, DO (Geriatric Medicine)   INTERVAL HISTORY: Allison [name] is a 71 y.o. female with stage IV non-small cell lung cancer (12/2017), hypertension, CVA, CAD, DVT/PE (on Lovenox), and GERD.  Palliative ask to see for symptom management and goals of care.  SOCIAL HISTORY:     reports that she has never smoked. She has never used smokeless tobacco. She reports that she does not currently use alcohol. She reports that she does not use drugs.  ADVANCE DIRECTIVES:  None on file   CODE STATUS: DNR  PAST MEDICAL HISTORY: Past Medical History:  Diagnosis Date   Anemia    Anxiety    Arthritis    Asthma    exercise induced   Depression    PMH   Dyspnea    GERD (gastroesophageal reflux disease)    Glaucoma    History of radiation therapy 01/05/2021   IMRT right lung  11/24/2020-01/05/2021  Dr [name]   Hypertension    lung ca dx'd 11/2017   right   Malignant pleural effusion    right   Nuclear sclerotic cataract of right eye 02/28/2021   Dr. Warden Fillers, cataract surgery February 2023   PONV (postoperative nausea and vomiting)    Pre-diabetes    Raynaud's disease    Raynaud's disease    Stroke ([place]) 01/2021   balance off, some express aphasia, weakness    ALLERGIES:  is allergic to hydrocodone-acetaminophen, penicillins, vicodin [hydrocodone-acetaminophen], and other.  MEDICATIONS:  Current  Outpatient Medications  Medication Sig Dispense Refill   acetaminophen (TYLENOL) 500 MG tablet Take 1,000 mg by mouth every 6 (six) hours as needed (pain).     ALPRAZolam (XANAX) 0.25 MG tablet Take 1 tablet (0.25 mg total) by mouth 2 (two) times daily as needed for anxiety. May take 2 tablets (0.5 mg total) at bedtime. 60 tablet 0   amLODipine (NORVASC) 5 MG tablet Take 5 mg by mouth daily.     brompheniramine-pseudoephedrine-DM 30-2-10 MG/5ML syrup Take 5 mLs by mouth 3 (three) times daily as needed. 120 mL 0   carvedilol (COREG) 12.5 MG tablet TAKE 1 TABLET BY MOUTH 2 TIMES DAILY. 180 tablet 1   cetirizine (ZYRTEC) 5 MG tablet Take 5 mg by mouth daily.     dexamethasone (DECADRON) 4 MG tablet Take 1 tablet twice a day the day before, the day of, and the day after chemotherapy 40 tablet 2   docusate sodium (COLACE) 100 MG capsule Take 1 capsule (100 mg total) by mouth every other day. (Patient not taking: Reported on 04/06/2022) 15 capsule 5   dorzolamide-timolol (COSOPT) 22.3-6.8 MG/ML ophthalmic solution Place 1 drop into both eyes 2 (two) times daily.     dronabinol (MARINOL) 2.5 MG capsule Take 1 capsule (2.5 mg total) by mouth 2 (two) times daily as needed (decreased appetite). (Patient not taking: Reported on 03/09/2022) 60 capsule 0   enoxaparin (LOVENOX) 60 MG/0.6ML injection Inject 0.6 mLs (60 mg total) into the skin every 12 (twelve) hours. (Patient taking  differently: Inject 80 mg into the skin daily. 80 mg) 60 mL 1   esomeprazole (NEXIUM) 20 MG capsule Take 1 capsule (20 mg total) by mouth in the morning and at bedtime. 60 capsule 2   ezetimibe (ZETIA) 10 MG tablet Take 10 mg by mouth daily.     FeFum-FePoly-FA-B Cmp-C-Biot (FOLIVANE-PLUS) CAPS TAKE 1 CAPSULE BY MOUTH EVERY DAY IN THE MORNING 90 capsule 0   FLUoxetine (PROZAC) 20 MG capsule Take 1 capsule (20 mg total) by mouth daily. 90 capsule 3   folic acid (FOLVITE) 1 MG tablet Take 1 tablet (1 mg total) by mouth daily. 90 tablet 1    hyoscyamine (LEVSIN SL) 0.125 MG SL tablet Place 2 tablets (0.25 mg total) under the tongue every 6 (six) hours as needed for cramping. Take 1-2 tablets under the tongue every 6 hours as needed for cramping 30 tablet 0   ipratropium-albuterol (DUONEB) 0.5-2.5 (3) MG/3ML SOLN Take 3 mLs by nebulization every 6 (six) hours as needed (Asthma).     isosorbide mononitrate (IMDUR) 30 MG 24 hr tablet Take 30 mg by mouth daily.     latanoprost (XALATAN) 0.005 % ophthalmic solution Place 1 drop into both eyes at bedtime.      ondansetron (ZOFRAN-ODT) 4 MG disintegrating tablet Take 1 tablet (4 mg total) by mouth every 8 (eight) hours as needed for nausea or vomiting. 30 tablet 1   osimertinib mesylate (TAGRISSO) 80 MG tablet Take 1 tablet (80 mg total) by mouth daily. 30 tablet 3   potassium chloride SA (KLOR-CON M) 20 MEQ tablet Take 1 tablet (20 mEq total) by mouth daily. 7 tablet 0   REPATHA SURECLICK 923 MG/ML SOAJ INJECT 140 MG INTO THE SKIN EVERY 14 (FOURTEEN) DAYS. 6 mL 3   valACYclovir (VALTREX) 500 MG tablet Take 1 tablet twice a day by oral route for 5 days.     No current facility-administered medications for this visit.   Facility-Administered Medications Ordered in Other Visits  Medication Dose Route Frequency Provider Last Rate Last Admin   sodium chloride flush (NS) 0.9 % injection 10 mL  10 mL Intracatheter PRN Curt Bears, MD   10 mL at 04/27/22 1147    VITAL SIGNS: LMP  (LMP Unknown)  There were no vitals filed for this visit.   Estimated body mass index is 21.46 kg/m as calculated from the following:   Height as of 04/06/22: 5\' 4"  (1.626 m).   Weight as of an earlier encounter on 04/27/22: 125 lb (56.7 kg).   PERFORMANCE STATUS (ECOG) : 1 - Symptomatic but completely ambulatory   IMPRESSION:  I saw Allison [name] during her infusion for symptom management follow-up.  No acute distress identified. Appetite continues to improve. Endorses increase in nausea. Remaining as  active as possible.   Fatigue  Resolved. Reports energy and activity level is great. She is able to to do many of the things she enjoys.    Anxiety Overall patient's anxiety is improved.  She is implementing anxiety relieving methods as discussed and taught during her visits with the spiritual support team here at the cancer center.  She shares today is not the best day. Gwenlyn continues to focus on relaxation techniques, focused breathing, and guided imagery. Able to sleep better throughout the night. She is also followed by home palliative with [name]. Will continue to take things day by day and practice relaxation.  3. Nausea  Ms. Stawicki shares she is experiencing increased nausea. She has not been  taking her home zofran. Advised to take as needed. She verbalized understanding.   PLAN: Symptoms are well controlled. Appetite is much improved. She is being followed by the dietician.  Nexium twice daily for indigestion. Tolerating well.  Anxiety well controlled. Occasional use of Xanax at bedtime.   Zofran as needed for nausea. I will plan to see back in 6-8 weeks in collaboration with her other oncology appointments.    Patient expressed understanding and was in agreement with this plan. She also understands that She can call the clinic at any time with any questions, concerns, or complaints.     Time Total: 20 min.   Visit consisted of counseling and education dealing with the complex and emotionally intense issues of symptom management and palliative care in the setting of serious and potentially life-threatening illness.Greater than 50%  of this time was spent counseling and coordinating care related to the above assessment and plan.  Alda Lea, AGPCNP-BC  Palliative Medicine Team/[place] [facility]

## 2022-04-27 NOTE — Progress Notes (Signed)
[facility] Spiritual Care Note  Follow Zarriah through [facility] and visited today in infusion.  She notes that her sessions with Jaclyn Shaggy Pesci/Counseling Intern are helping her cope with anxiety and work on meaning-making. She uses exercises from those encounters for self-calming in general and at bedtime.   Provided reflective listening, emotional support, spiritual encouragement, and prayer per request. [name] notes that pastoral visits help lift her up and feel encouraged.  We plan to follow up at treatments and through group.  [place], [place], [facility] Pager [phone] Voicemail [phone]

## 2022-04-27 NOTE — Patient Instructions (Signed)
[facility]  Discharge Instructions: Thank you for choosing [facility] to provide your oncology and hematology care.   If you have a lab appointment with the [facility], please go directly to the [facility] and check in at the registration area.   Wear comfortable clothing and clothing appropriate for easy access to any Portacath or PICC line.   We strive to give you quality time with your provider. You may need to reschedule your appointment if you arrive late (15 or more minutes).  Arriving late affects you and other patients whose appointments are after yours.  Also, if you miss three or more appointments without notifying the office, you may be dismissed from the clinic at the provider's discretion.      For prescription refill requests, have your pharmacy contact our office and allow 72 hours for refills to be completed.    Today you received the following chemotherapy and/or immunotherapy agents: pemetrexed      To help prevent nausea and vomiting after your treatment, we encourage you to take your nausea medication as directed.  BELOW ARE SYMPTOMS THAT SHOULD BE REPORTED IMMEDIATELY: *FEVER GREATER THAN 100.4 F (38 C) OR HIGHER *CHILLS OR SWEATING *NAUSEA AND VOMITING THAT IS NOT CONTROLLED WITH YOUR NAUSEA MEDICATION *UNUSUAL SHORTNESS OF BREATH *UNUSUAL BRUISING OR BLEEDING *URINARY PROBLEMS (pain or burning when urinating, or frequent urination) *BOWEL PROBLEMS (unusual diarrhea, constipation, pain near the anus) TENDERNESS IN MOUTH AND THROAT WITH OR WITHOUT PRESENCE OF ULCERS (sore throat, sores in mouth, or a toothache) UNUSUAL RASH, SWELLING OR PAIN  UNUSUAL VAGINAL DISCHARGE OR ITCHING   Items with * indicate a potential emergency and should be followed up as soon as possible or go to the Emergency Department if any problems should occur.  Please show the CHEMOTHERAPY ALERT CARD or IMMUNOTHERAPY ALERT CARD at check-in to  the Emergency Department and triage nurse.  Should you have questions after your visit or need to cancel or reschedule your appointment, please contact [facility]  Dept: [phone]  and follow the prompts.  Office hours are 8:00 a.m. to 4:30 p.m. Monday - Friday. Please note that voicemails left after 4:00 p.m. may not be returned until the following business day.  We are closed weekends and major holidays. You have access to a nurse at all times for urgent questions. Please call the main number to the clinic Dept: [phone] and follow the prompts.   For any non-urgent questions, you may also contact your provider using MyChart. We now offer e-Visits for anyone 40 and older to request care online for non-urgent symptoms. For details visit [email or web address].   Also download the MyChart app! Go to the app store, search "MyChart", open the app, select [facility], and log in with your MyChart username and password.  Masks are optional in the cancer centers. If you would like for your care team to wear a mask while they are taking care of you, please let them know. You may have one support person who is at least 71 years old accompany you for your appointments.

## 2022-05-01 ENCOUNTER — Telehealth: Payer: Self-pay | Admitting: Internal Medicine

## 2022-05-01 NOTE — Telephone Encounter (Signed)
Called patient regarding upcoming November, December and January appointments. Left a voicemail.

## 2022-05-04 ENCOUNTER — Other Ambulatory Visit: Payer: Medicare HMO | Admitting: Internal Medicine

## 2022-05-05 ENCOUNTER — Telehealth: Payer: Self-pay

## 2022-05-05 MED ORDER — CARVEDILOL 25 MG PO TABS: 25.0000 mg | ORAL_TABLET | Freq: Two times a day (BID) | ORAL | 3 refills | Status: DC

## 2022-05-05 NOTE — Telephone Encounter (Signed)
(  4:10 pm) Patient returned call to [facility] follow-up visit was scheduled with patient for 05/08/22 @12 :30 pm.

## 2022-05-05 NOTE — Telephone Encounter (Signed)
(  3:16 pm) PC SW telephoned patient to schedule a follow-up visit. SW did not receive an answer and left a message requesting a call back.

## 2022-05-05 NOTE — Telephone Encounter (Signed)
Recommend increasing carvedilol to 25 mg twice daily.  Would check blood pressure/heart rate once daily for next 2 weeks and send [place] results

## 2022-05-08 ENCOUNTER — Other Ambulatory Visit: Payer: Medicare HMO | Admitting: *Deleted

## 2022-05-08 ENCOUNTER — Other Ambulatory Visit: Payer: Medicare HMO

## 2022-05-08 VITALS — BP 138/78 | HR 67 | Temp 97.8°F | Resp 17

## 2022-05-08 DIAGNOSIS — Z515 Encounter for palliative care: Secondary | ICD-10-CM

## 2022-05-08 NOTE — Progress Notes (Signed)
COMMUNITY PALLIATIVE CARE SW NOTE  PATIENT NAME: Allison [name] DOB: 1951/04/30 MRN: [id number]  PRIMARY CARE PROVIDER: [name], MD  RESPONSIBLE PARTY:  Acct ID - Guarantor Home Phone Work Phone Relationship Acct Type  [id number] - Petrilla,PAU* [phone]  Self P/F     [address] [place], [place], [place] [address]   SW and RN-M. Nadara Mustard completed a follow-up visit with patient at her home.  PLAN OF CARE and INTERVENTIONS:             GOALS OF CARE/ ADVANCE CARE PLANNING:  Goal is for patient to feel better, remain independent and in her home.   SOCIAL/EMOTIONAL/SPIRITUAL ASSESSMENT/ INTERVENTIONS:  Patient reported that she continues receiving chemotherapy every three weeks. Her most recent scan indicates that her cancer is stable and has not enlarged. She has issues having a BM as her stools range from being hard or explosive diarrhea which causes some incontinence. She wears compression hose to manage edema in her ankles. Her blood pressures have been elevated, she was started on a new BP dosage and she is recording her blood pressure over the next two weeks and will then share it with her doctor. She report her appetite has improved where she is intentionally snacking or eating every 3 to 4 hours. Patient reported some weight gain. Patient report that she continues to have anxiety that she is managing by exercising and counseling. She also  has issues sleeping, but takes Xanex and she utilizes meditation when needed. Patient has two prescriptions (Colace & Prozac) that need to be refilled-RN to follow-up. Patient verbalized  no concerns and she remain open to ongoing visits and support by the palliative care team.   PATIENT/CAREGIVER EDUCATION/ COPING:  Patient appears to be coping well. Her mood was positive and affect was bright. PERSONAL EMERGENCY PLAN:  Patient is able to make her needs known.  COMMUNITY RESOURCES COORDINATION/ HEALTH CARE NAVIGATION:  Patient is receiving  counseling through the cancer center. She also exercises daily through and online app with the [facility] program.  FINANCIAL/LEGAL CONCERNS/INTERVENTIONS:  No financial or legal issues. Patient  has insurance through [employer].     SOCIAL HX:  Social History   Tobacco Use   Smoking status: Never   Smokeless tobacco: Never  Substance Use Topics   Alcohol use: Not Currently    Comment: up to 3 drinks per week    CODE STATUS: DNR ADVANCED DIRECTIVES: Yes MOST FORM COMPLETE:  Yes HOSPICE EDUCATION PROVIDED: No  Duration of visit and documentation: 60 minutes   Lasonya Hubner, LCSW

## 2022-05-09 NOTE — Progress Notes (Signed)
[name] COMMUNITY PALLIATIVE CARE RN NOTE  PATIENT NAME: Allison [name] DOB: [birth year] MRN: [id number]  PRIMARY CARE PROVIDER: [name], MD  RESPONSIBLE PARTY: Carilyn Goodpasture (frient) Acct ID - Guarantor Home Phone Work Phone Relationship Acct Type  [id number] - Taniguchi,PAU* [phone]  Self P/F     [address] [place], [place], [place] [address]    RN/SW team met with patient for follow up visit in her home.  Cognitive: Alert and oriented x 4. Pleasant mood and conversational.  Pain: Denies pain at this time.  Respiratory: Patient has Stage 4 adenocarcinoma of the right lung. She is currently receiving chemo every 3 weeks.  Cardiovascular: Recently having issues with elevated blood pressures. Her Carvedilol was increased from 12.5 mg to 25 mg daily last week. Patient has been keeping a log of her Bps and they have been better since this increase. Has trace edema in her ankles but she says they are better. She is wearing compression hose.   Appetite: Has improved. Reports she has been gaining weight. She eats small meals about every 3-4 hours in order to keep food down. Says she has started havign to take Zofran which does helps with nausea.  GI: Patient had an episode of explosive diarrhea on Friday along with severe abdominal pain. She took her Levsin to help. Most other times she has small hard formed stools that at times may come out while she is simply walking, She has started taking Colace 100 mg daily and this has made it easier for her to have a BM.  Mobility: Remains independent with ADLs. Exercises 15 min/day in her home.  Refills: Patient needs a refill on Prozac and Colace. Spoke with our [facility] who sent [facility] on [address].    CODE STATUS: DNR ADVANCED DIRECTIVES: Y MOST FORM: yes PPS: 50%   PHYSICAL EXAM:   VITALS: Today's Vitals   05/08/22 1301  BP: 138/78  Pulse: 67  Resp: 17  Temp: 97.8 F (36.6 C)   TempSrc: Temporal  SpO2: 91%  PainSc: 0-No pain    LUNGS: clear to auscultation  CARDIAC: Cor RRR EXTREMITIES: Trace ankle edema SKIN:  Exposed skin is dry and intact   NEURO:  Alert and oriented x 4, ambulatory    Daryl Eastern, RN BSN

## 2022-05-15 ENCOUNTER — Telehealth: Payer: Self-pay

## 2022-05-15 NOTE — Telephone Encounter (Signed)
Lysle Morales, counseling intern, called patient to reschedule after patient had to cancel their appointment due to a scheduling conflict.   Patient rescheduled their appointment for Thursday, December 7th at 1:45pm via [place].   Lysle Morales,  Counseling Intern  [phone] [email or web address]

## 2022-05-16 ENCOUNTER — Other Ambulatory Visit: Payer: Self-pay | Admitting: Medical Oncology

## 2022-05-16 DIAGNOSIS — C3491 Malignant neoplasm of unspecified part of right bronchus or lung: Secondary | ICD-10-CM

## 2022-05-16 MED ORDER — OSIMERTINIB MESYLATE 80 MG PO TABS: 80.0000 mg | ORAL_TABLET | Freq: Every day | ORAL | 4 refills | Status: DC

## 2022-05-16 NOTE — Progress Notes (Unsigned)
[facility] OFFICE PROGRESS NOTE  [name], [place] [address]  DIAGNOSIS: Stage IV (T2 a,N2, M1a) non-small cell lung cancer, adenocarcinoma diagnosed in July 2019 and presented with right upper lobe lung mass in addition to mediastinal lymphadenopathy as well as bilateral pulmonary nodules and malignant right pleural effusion.   Biomarker Findings Microsatellite status - Cannot Be Determined Tumor Mutational Burden - Cannot Be Determined Genomic Findings For a complete list of the genes assayed, please refer to the Appendix. EGFR exon 19 deletion ([id number]) TP53 Y220C 7 Disease relevant genes with no reportable alterations: KRAS, ALK, BRAF, MET, RET, ERBB2, ROS1   PRIOR THERAPY: 1) Status post right Pleurx catheter placement by Dr. Prescott Gum for drainage of malignant right pleural effusion. 2) palliative radiotherapy to the enlarging right upper lobe lung mass and mediastinum under the care of Dr. Sondra Come expected to be completed on January 05, 2021. 3) Tagrisso 80 mg p.o. daily.  First dose was given on 01/29/2018.  Status post 39 months of treatment.  CURRENT THERAPY: Systemic chemotherapy with carboplatin for AUC of 5 and Alimta 500 Mg/M2 every 3 weeks.  First dose 03/17/2021.  The patient will also continue her current treatment with Tagrisso 80 mg p.o. daily.  She is status post 20 cycles. Starting from cycle #7, she will be on Alimta only 500 mg/m2.  Alimta was eventually reduced to 400 mg per metered squared due to renal insufficiency    INTERVAL HISTORY: CAROLYNN TULEY 71 y.o. female returns to the clinic today for a follow-up visit accompanied by her boyfriend.  The patient is currently undergoing single agent dose reduced Alimta IV every 3 weeks.  The dose of Alimta was reduced due to renal insufficiency.  She reported symptoms of fatigue, stomach aches/nausea/ heart burn beginning about 3 days after treatment and ending  after 4 days. Overall, she is able to manage her symptoms with zofran, decadron, and pepcid.   She is working with her PCP on her blood pressure regimen. She also states she saw an eye specliast due to visual distortion which is causing issues with depth perception. He reports the surgery would take about a year to recover visually. Therefore, they are not going to perform surgery at this time.    She follows closely with palliative care and overall many of her symptoms have improved.  Her anxiety is controlled. She has been talking to the counselor here at the cancer center about her anxiety which has been helpful for her.   She denies any recent fever, chills, although she is always cold natured. Of note, she has stable anemia. She reports continued stable/unchanged night sweats. Her breathing is stable to maybe slight more short when she talks. She has a chronic dry cough, but reports she feel like she has phlegm stuck in her airway that she can't get up. She has been trying to drink water and continues to take mucinex and use her vibrating vest. She denies sick contacts or other symptoms of infection like sore throat.  She denies any hemoptysis.  She reports some chest tightness when phlegm is produced. She denies sharp chest pain. She uses Tylenol for chest tightness. She follows with Dr. Valeta Harms from [facility] pulmonary medicine.      The patient denies any nausea or vomiting although she has had some indigestion and belching for which she takes Pepcid. Pepcid has been working for her.She also has been extending her decadron a few  extra days after infusion and taking zofran which has helped her symptoms following the last infusion.  She also takes levin for abdominal cramping.   She continues to take her iron supplement.  She denies any headache or visual changes.      She is here today for evaluation before undergoing cycle #21.    MEDICAL HISTORY: Past Medical History:  Diagnosis Date   Anemia     Anxiety    Arthritis    Asthma    exercise induced   Depression    PMH   Dyspnea    GERD (gastroesophageal reflux disease)    Glaucoma    History of radiation therapy 01/05/2021   IMRT right lung  11/24/2020-01/05/2021  Dr [name]   Hypertension    lung ca dx'd 11/2017   right   Malignant pleural effusion    right   Nuclear sclerotic cataract of right eye 02/28/2021   Dr. Warden Fillers, cataract surgery February 2023   PONV (postoperative nausea and vomiting)    Pre-diabetes    Raynaud's disease    Raynaud's disease    Stroke ([place]) 01/2021   balance off, some express aphasia, weakness    ALLERGIES:  is allergic to hydrocodone-acetaminophen, penicillins, vicodin [hydrocodone-acetaminophen], and other.  MEDICATIONS:  Current Outpatient Medications  Medication Sig Dispense Refill   acetaminophen (TYLENOL) 500 MG tablet Take 1,000 mg by mouth every 6 (six) hours as needed (pain).     ALPRAZolam (XANAX) 0.25 MG tablet Take 1 tablet (0.25 mg total) by mouth 2 (two) times daily as needed for anxiety. May take 2 tablets (0.5 mg total) at bedtime. 60 tablet 0   amLODipine (NORVASC) 5 MG tablet Take 5 mg by mouth daily.     brompheniramine-pseudoephedrine-DM 30-2-10 MG/5ML syrup Take 5 mLs by mouth 3 (three) times daily as needed. 120 mL 0   carvedilol (COREG) 25 MG tablet Take 1 tablet (25 mg total) by mouth 2 (two) times daily. 180 tablet 3   cetirizine (ZYRTEC) 5 MG tablet Take 5 mg by mouth daily.     dexamethasone (DECADRON) 4 MG tablet Take 1 tablet twice a day the day before, the day of, and the day after chemotherapy 40 tablet 2   docusate sodium (COLACE) 100 MG capsule Take 1 capsule (100 mg total) by mouth every other day. 15 capsule 5   dorzolamide-timolol (COSOPT) 22.3-6.8 MG/ML ophthalmic solution Place 1 drop into both eyes 2 (two) times daily.     dronabinol (MARINOL) 2.5 MG capsule Take 1 capsule (2.5 mg total) by mouth 2 (two) times daily as needed (decreased  appetite). 60 capsule 0   enoxaparin (LOVENOX) 60 MG/0.6ML injection Inject 0.6 mLs (60 mg total) into the skin every 12 (twelve) hours. (Patient taking differently: Inject 80 mg into the skin daily. 80 mg) 60 mL 1   esomeprazole (NEXIUM) 20 MG capsule Take 1 capsule (20 mg total) by mouth in the morning and at bedtime. 60 capsule 2   ezetimibe (ZETIA) 10 MG tablet Take 10 mg by mouth daily.     FeFum-FePoly-FA-B Cmp-C-Biot (FOLIVANE-PLUS) CAPS TAKE 1 CAPSULE BY MOUTH EVERY DAY IN THE MORNING 90 capsule 0   FLUoxetine (PROZAC) 20 MG capsule Take 1 capsule (20 mg total) by mouth daily. 90 capsule 3   folic acid (FOLVITE) 1 MG tablet Take 1 tablet (1 mg total) by mouth daily. 90 tablet 1   hyoscyamine (LEVSIN SL) 0.125 MG SL tablet Place 2 tablets (0.25 mg total) under  the tongue every 6 (six) hours as needed for cramping. Take 1-2 tablets under the tongue every 6 hours as needed for cramping 30 tablet 0   ipratropium-albuterol (DUONEB) 0.5-2.5 (3) MG/3ML SOLN Take 3 mLs by nebulization every 6 (six) hours as needed (Asthma).     isosorbide mononitrate (IMDUR) 30 MG 24 hr tablet Take 30 mg by mouth daily.     latanoprost (XALATAN) 0.005 % ophthalmic solution Place 1 drop into both eyes at bedtime.      ondansetron (ZOFRAN-ODT) 4 MG disintegrating tablet Take 1 tablet (4 mg total) by mouth every 8 (eight) hours as needed for nausea or vomiting. 30 tablet 1   osimertinib mesylate (TAGRISSO) 80 MG tablet Take 1 tablet (80 mg total) by mouth daily. 30 tablet 4   potassium chloride SA (KLOR-CON M) 20 MEQ tablet Take 1 tablet (20 mEq total) by mouth daily. 7 tablet 0   REPATHA SURECLICK 097 MG/ML SOAJ INJECT 140 MG INTO THE SKIN EVERY 14 (FOURTEEN) DAYS. 6 mL 3   valACYclovir (VALTREX) 500 MG tablet Take 1 tablet twice a day by oral route for 5 days.     No current facility-administered medications for this visit.   Facility-Administered Medications Ordered in Other Visits  Medication Dose Route  Frequency Provider Last Rate Last Admin   heparin lock flush 100 unit/mL  500 Units Intracatheter Once PRN Curt Bears, MD       PEMEtrexed (ALIMTA) 600 mg in sodium chloride 0.9 % 100 mL chemo infusion  400 mg/m2 (Treatment Plan Recorded) Intravenous Once Curt Bears, MD       sodium chloride flush (NS) 0.9 % injection 10 mL  10 mL Intracatheter PRN Curt Bears, MD        SURGICAL HISTORY:  Past Surgical History:  Procedure Laterality Date   ABDOMINAL HYSTERECTOMY     partial   BRONCHIAL BIOPSY  04/21/2021   Procedure: BRONCHIAL BIOPSIES;  Surgeon: [name], DO;  Location: [facility];  Service: Pulmonary;;   BRONCHIAL BRUSHINGS  04/21/2021   Procedure: BRONCHIAL BRUSHINGS;  Surgeon: [name], DO;  Location: [facility];  Service: Pulmonary;;   BRONCHIAL NEEDLE ASPIRATION BIOPSY  04/21/2021   Procedure: BRONCHIAL NEEDLE ASPIRATION BIOPSIES;  Surgeon: [name], DO;  Location: [facility];  Service: Pulmonary;;   CHEST TUBE INSERTION Right 01/01/2018   Procedure: INSERTION PLEURAL DRAINAGE CATHETER;  Surgeon: Ivin Poot, MD;  Location: [facility];  Service: Thoracic;  Laterality: Right;   CHEST TUBE INSERTION  04/21/2021   Procedure: CHEST TUBE INSERTION;  Surgeon: [name], DO;  Location: [facility];  Service: Pulmonary;;   COLONOSCOPY     CORONARY STENT INTERVENTION N/A 06/16/2019   Procedure: CORONARY STENT INTERVENTION;  Surgeon: Jettie Booze, MD;  Location: [facility];  Service: Cardiovascular;  Laterality: N/A;   DILATION AND CURETTAGE OF UTERUS     EYE SURGERY     due to Glaucoma   IR IMAGING GUIDED PORT INSERTION  03/22/2021   IR PORT REPAIR CENTRAL VENOUS ACCESS DEVICE  04/15/2021   LEFT HEART CATH AND CORONARY ANGIOGRAPHY N/A 06/16/2019   Procedure: LEFT HEART CATH AND CORONARY ANGIOGRAPHY;  Surgeon: Jettie Booze, MD;  Location: [facility];  Service: Cardiovascular;  Laterality: N/A;   REMOVAL OF  PLEURAL DRAINAGE CATHETER Right 11/07/2018   Procedure: REMOVAL OF PLEURAL DRAINAGE CATHETER;  Surgeon: Ivin Poot, MD;  Location: [facility];  Service: Thoracic;  Laterality: Right;  ROTATOR CUFF REPAIR     TUBAL LIGATION     VIDEO BRONCHOSCOPY WITH ENDOBRONCHIAL NAVIGATION Bilateral 04/21/2021   Procedure: VIDEO BRONCHOSCOPY WITH ENDOBRONCHIAL NAVIGATION;  Surgeon: [name], DO;  Location: [facility];  Service: Pulmonary;  Laterality: Bilateral;  ION   WISDOM TOOTH EXTRACTION      REVIEW OF SYSTEMS:   Review of Systems  Constitutional: Positive for fatigue with overactivity.  Lost 1 lb since last being seen. Negative for appetite change, chills, and fever.  HENT:   Negative for mouth sores, nosebleeds, sore throat and trouble swallowing.   Eyes: Negative for eye problems and icterus.  Respiratory: Positive for stable dyspnea with overactivity.  Positive chronic cough. Negative for hemoptysis and wheezing.   Cardiovascular: Negative for chest pain and leg swelling.  Gastrointestinal: Positive for indigestion following treatment. Negative for abdominal pain, constipation, diarrhea, nausea and vomiting.  Genitourinary: Negative for bladder incontinence, difficulty urinating, dysuria, frequency and hematuria.   Musculoskeletal: Negative for back pain, gait problem, neck pain and neck stiffness.  Skin: Negative for itching and rash.  Neurological: Negative for dizziness, extremity weakness, gait problem, headaches, light-headedness and seizures.  Hematological: Negative for adenopathy. Does not bruise/bleed easily.  Psychiatric/Behavioral: Negative for confusion, depression and sleep disturbance. The patient is not nervous/anxious.     PHYSICAL EXAMINATION:  Blood pressure (!) 155/69, pulse 72, temperature 98.3 F (36.8 C), temperature source Oral, resp. rate 16, weight 123 lb 11.2 oz (56.1 kg), SpO2 100 %.  ECOG PERFORMANCE STATUS: 1  Physical Exam  Constitutional: Oriented to  person, place, and time and well-developed, well-nourished, and in no distress.  HENT:  Head: Normocephalic and atraumatic.  Mouth/Throat: Oropharynx is clear and moist. No oropharyngeal exudate.  Eyes: Conjunctivae are normal. Right eye exhibits no discharge. Left eye exhibits no discharge. No scleral icterus.  Neck: Normal range of motion. Neck supple.  Cardiovascular: Normal rate, regular rhythm, normal heart sounds.   Pulmonary/Chest: Effort normal and breath sounds normal. No respiratory distress. No wheezes. No rales.  Abdominal: Exhibits no distension Musculoskeletal: Normal range of motion. Mild bilateral ankle edema.  Lymphadenopathy:    No cervical adenopathy.  Neurological: Alert and oriented to person, place, and time. Coordination normal. Uses cane Skin: Skin is warm and dry. No rash noted. Not diaphoretic. No erythema. No pallor.  Psychiatric: Mood, memory and judgment normal.  Vitals reviewed.  LABORATORY DATA: Lab Results  Component Value Date   WBC 4.2 05/18/2022   HGB 10.7 (L) 05/18/2022   HCT 33.3 (L) 05/18/2022   MCV 89.5 05/18/2022   PLT 185 05/18/2022      Chemistry      Component Value Date/Time   NA 141 05/18/2022 1108   NA 140 03/04/2021 1516   K 3.9 05/18/2022 1108   CL 106 05/18/2022 1108   CO2 26 05/18/2022 1108   BUN 21 05/18/2022 1108   BUN 21 03/04/2021 1516   CREATININE 1.23 (H) 05/18/2022 1108      Component Value Date/Time   CALCIUM 9.7 05/18/2022 1108   ALKPHOS 65 05/18/2022 1108   AST 31 05/18/2022 1108   ALT 33 05/18/2022 1108   BILITOT 0.3 05/18/2022 1108       RADIOGRAPHIC STUDIES:  OCT, Retina - OU - Both Eyes  Result Date: 05/17/2022 Right Eye Quality was good. Scan locations included subfoveal. Central Foveal Thickness: 552. Progression has no prior data. Findings include no SRF, abnormal foveal contour, epiretinal membrane, intraretinal fluid, macular pucker, preretinal fibrosis (ERM with  cystic changes, pucker and  central thicking). Left Eye Quality was good. Scan locations included subfoveal. Central Foveal Thickness: 227. Progression has no prior data. Findings include normal foveal contour, no IRF, no SRF (Trace ERM). Notes *Images captured and stored on drive Diagnosis / Impression: OD: ERM with cystic changes, pucker and central thicking OS: Trace ERM Clinical management: See below Abbreviations: NFP - Normal foveal profile. CME - cystoid macular edema. PED - pigment epithelial detachment. IRF - intraretinal fluid. SRF - subretinal fluid. EZ - ellipsoid zone. ERM - epiretinal membrane. ORA - outer retinal atrophy. ORT - outer retinal tubulation. SRHM - subretinal hyper-reflective material. IRHM - intraretinal hyper-reflective material     ASSESSMENT/PLAN:  This is a very pleasant 71 year old never smoker African-American female diagnosed with stage IV non-small cell lung cancer, adenocarcinoma.  She was positive for an EGFR mutation with deletion in exon 19.  She was diagnosed in July 2019 and presented with right upper lobe lung mass in addition to mediastinal lymphadenopathy as well as bilateral pulmonary nodules and malignant right pleural effusion.   The patient was started on treatment with Tagrisso 80 mg p.o. daily status post 39 months of treatment. Started on 01/29/2018.  In April 2022, she showed evidence of disease progression with interval progression of the right apical lung mass in addition to progression of mediastinal lymphadenopathy concerning for worsening of her disease. She had repeat Guardant 360 molecular studies which did not show evidence of new resistant mutations.  Therefore, she was referred to radiation oncology and completed palliative radiotherapy to the enlarging right upper lobe lung mass and mediastinum under the care of Dr. Sondra Come.  This was completed in July 2022.   Unfortunately, the patient recently was found to have evidence of disease progression.  Therefore, Dr. Julien Nordmann  started the patient on systemic chemotherapy with carboplatin for AUC of 5 and Alimta 500 mg per metered squared.  She is status post 20 cycles and she tolerated it fairly well despite her ongoing issues with fatigue, chest tightness, cough, and weight loss.  Alimta was reduced to 400 mg per metered squared due to renal insufficiency.  She is not a good candidate for Avastin due to her recent CVA in August 2022.  She also is continuing to take Tagrisso as it is protective against progressive metastatic disease to the brain. Starting from cycle #7,  she started maintenance single agent alimta.    The patient underwent a repeat bronchoscopy and biopsy for repeat molecular testing.  The sample from the left and right lung biopsy was negative for malignancy. This was performed in November 2022.   The patient saw Dr. Durenda Hurt from [facility] for second opinion in November 2022.  They discussed if she progresses on chemotherapy that there may be upcoming options. He discussed other treatment options which may be available in 2023 including patritumab deruxtcan and lazertinib/amivantmab. If these are not available, he wrote that they can determine if they can get the agents through an expanded access, single patient IND, or clinical trial if she has progression with chemotherapy.     Labs were reviewed.  Recommend that she proceed with cycle #21 today as scheduled.   We will see her back for follow-up visit in 3 weeks for evaluation repeat blood work before undergoing cycle #22  I will arrange for a restaging CT scan prior to her next appointment. I requesting in the order that they use modified lower level of contrast if possible.   She  will continue to follow with palliative care and  counseling.    I offered a chest x ray today due to her change in cough. The patient states she does not feel that her symptoms warrant imaging. She  states it is not that significant. She knows to call if she  develops new or worsening symptoms. We will evaluate her lungs on her upcoming repeat imaging.   The patient was advised to call immediately if she has any concerning symptoms in the interval. The patient voices understanding of current disease status and treatment options and is in agreement with the current care plan. All questions were answered. The patient knows to call the clinic with any problems, questions or concerns. We can certainly see the patient much sooner if necessary          Orders Placed This Encounter  Procedures   CT Chest W Contrast    Standing Status:   Future    Standing Expiration Date:   05/18/2023    Order Specific Question:   If indicated for the ordered procedure, I authorize the administration of contrast media per Radiology protocol    Answer:   Yes    Order Specific Question:   Does the patient have a contrast media/X-ray dye allergy?    Answer:   No    Order Specific Question:   Preferred imaging location?    Answer:   [facility]     The total time spent in the appointment was 20-29 minutes.   Kordel Leavy L Tonique Mendonca, PA-C 05/18/22

## 2022-05-16 NOTE — Progress Notes (Signed)
[facility] Note  05/17/2022     CHIEF COMPLAINT Patient presents for Retina Evaluation   HISTORY OF PRESENT ILLNESS: Allison [name] is a 71 y.o. female who presents to the clinic today for:   HPI     Retina Evaluation   In both eyes.  This started years ago.  Context:  distance vision, mid-range vision, near vision, reading and watching TV.  I, the attending physician,  performed the HPI with the patient and updated documentation appropriately.        Comments   Patient is here today based on a referral from Dr. Katy Fitch for an ERM OD. She is complaining of decreased vision, blurry vision, and a problem with depth perception.  She has a hx of Glaucoma sx. She is using Latanoprost OU QHS. She has lung cancer. She has had a heart attack and a stroke.       Last edited by Bernarda Caffey, MD on 05/18/2022  3:54 PM.    Pt is here on the referral of Dr. Midge Aver for concern of ERM OD, pt has also seen Dr. Zadie Rhine for this problem, pt states Dr. Zadie Rhine told her that she would have to have surgery to repair the ERM, pt states she is on blood thinners, she states when she had cataract sx with Dr. Katy Fitch, it caused her IOP to increase and caused a headache, pt states she does not feel comfortable driving bc she cannot see street signs and cannot judge distances, pt endorses being hypertensive  Referring physician: Warden Fillers, MD [address] [place],  [place] [address]  HISTORICAL INFORMATION:   Selected notes from the MEDICAL RECORD NUMBER Referred by Dr. Katy Fitch for second opinion ERM OD LEE:  Ocular Hx- saw Dr. Zadie Rhine 2x PMH-    CURRENT MEDICATIONS: Current Outpatient Medications (Ophthalmic Drugs)  Medication Sig   dorzolamide-timolol (COSOPT) 22.3-6.8 MG/ML ophthalmic solution Place 1 drop into both eyes 2 (two) times daily.   latanoprost (XALATAN) 0.005 % ophthalmic solution Place 1 drop into both eyes at bedtime.    No current  facility-administered medications for this visit. (Ophthalmic Drugs)   Current Outpatient Medications (Other)  Medication Sig   acetaminophen (TYLENOL) 500 MG tablet Take 1,000 mg by mouth every 6 (six) hours as needed (pain).   ALPRAZolam (XANAX) 0.25 MG tablet Take 1 tablet (0.25 mg total) by mouth 2 (two) times daily as needed for anxiety. May take 2 tablets (0.5 mg total) at bedtime.   amLODipine (NORVASC) 5 MG tablet Take 5 mg by mouth daily.   brompheniramine-pseudoephedrine-DM 30-2-10 MG/5ML syrup Take 5 mLs by mouth 3 (three) times daily as needed.   carvedilol (COREG) 25 MG tablet Take 1 tablet (25 mg total) by mouth 2 (two) times daily.   cetirizine (ZYRTEC) 5 MG tablet Take 5 mg by mouth daily.   dexamethasone (DECADRON) 4 MG tablet Take 1 tablet twice a day the day before, the day of, and the day after chemotherapy   docusate sodium (COLACE) 100 MG capsule Take 1 capsule (100 mg total) by mouth every other day.   dronabinol (MARINOL) 2.5 MG capsule Take 1 capsule (2.5 mg total) by mouth 2 (two) times daily as needed (decreased appetite).   enoxaparin (LOVENOX) 60 MG/0.6ML injection Inject 0.6 mLs (60 mg total) into the skin every 12 (twelve) hours. (Patient taking differently: Inject 80 mg into the skin daily. 80 mg)   esomeprazole (NEXIUM) 20 MG capsule  Take 1 capsule (20 mg total) by mouth in the morning and at bedtime.   ezetimibe (ZETIA) 10 MG tablet Take 10 mg by mouth daily.   FeFum-FePoly-FA-B Cmp-C-Biot (FOLIVANE-PLUS) CAPS TAKE 1 CAPSULE BY MOUTH EVERY DAY IN THE MORNING   FLUoxetine (PROZAC) 20 MG capsule Take 1 capsule (20 mg total) by mouth daily.   folic acid (FOLVITE) 1 MG tablet Take 1 tablet (1 mg total) by mouth daily.   hyoscyamine (LEVSIN SL) 0.125 MG SL tablet Place 2 tablets (0.25 mg total) under the tongue every 6 (six) hours as needed for cramping. Take 1-2 tablets under the tongue every 6 hours as needed for cramping   ipratropium-albuterol (DUONEB) 0.5-2.5 (3)  MG/3ML SOLN Take 3 mLs by nebulization every 6 (six) hours as needed (Asthma).   isosorbide mononitrate (IMDUR) 30 MG 24 hr tablet Take 30 mg by mouth daily.   ondansetron (ZOFRAN-ODT) 4 MG disintegrating tablet Take 1 tablet (4 mg total) by mouth every 8 (eight) hours as needed for nausea or vomiting.   osimertinib mesylate (TAGRISSO) 80 MG tablet Take 1 tablet (80 mg total) by mouth daily.   potassium chloride SA (KLOR-CON M) 20 MEQ tablet Take 1 tablet (20 mEq total) by mouth daily.   REPATHA SURECLICK 416 MG/ML SOAJ INJECT 140 MG INTO THE SKIN EVERY 14 (FOURTEEN) DAYS.   valACYclovir (VALTREX) 500 MG tablet Take 1 tablet twice a day by oral route for 5 days.   No current facility-administered medications for this visit. (Other)   Facility-Administered Medications Ordered in Other Visits (Other)  Medication Route   sodium chloride flush (NS) 0.9 % injection 10 mL Intracatheter   REVIEW OF SYSTEMS: ROS   Positive for: Constitutional, Neurological, Eyes, Respiratory, Heme/Lymph Last edited by Annie Paras, COT on 05/17/2022  1:11 PM.     ALLERGIES Allergies  Allergen Reactions   Hydrocodone-Acetaminophen Other (See Comments)   Penicillins Other (See Comments)    SYNCOPE PATIENT HAS HAD A PCN REACTION WITH IMMEDIATE RASH, FACIAL/TONGUE/THROAT SWELLING, SOB, OR LIGHTHEADEDNESS WITH HYPOTENSION:  #  #  YES  #  # Has patient had a PCN reaction causing severe rash involving mucus membranes or skin necrosis: No Has patient had a PCN reaction that required hospitalization: No Has patient had a PCN reaction occurring within the last 10 years: No  Other reaction(s): other   Vicodin [Hydrocodone-Acetaminophen] Other (See Comments)    Sped up heart and breathing   Other Other (See Comments)    Glaucoma eye drop - turned eyes dark   PAST MEDICAL HISTORY Past Medical History:  Diagnosis Date   Anemia    Anxiety    Arthritis    Asthma    exercise induced   Depression    PMH    Dyspnea    GERD (gastroesophageal reflux disease)    Glaucoma    History of radiation therapy 01/05/2021   IMRT right lung  11/24/2020-01/05/2021  Dr [name]   Hypertension    lung ca dx'd 11/2017   right   Malignant pleural effusion    right   Nuclear sclerotic cataract of right eye 02/28/2021   Dr. Warden Fillers, cataract surgery February 2023   PONV (postoperative nausea and vomiting)    Pre-diabetes    Raynaud's disease    Raynaud's disease    Stroke ([place]) 01/2021   balance off, some express aphasia, weakness   Past Surgical History:  Procedure Laterality Date   ABDOMINAL HYSTERECTOMY  partial   BRONCHIAL BIOPSY  04/21/2021   Procedure: BRONCHIAL BIOPSIES;  Surgeon: [name], DO;  Location: [facility];  Service: Pulmonary;;   BRONCHIAL BRUSHINGS  04/21/2021   Procedure: BRONCHIAL BRUSHINGS;  Surgeon: [name], DO;  Location: [facility];  Service: Pulmonary;;   BRONCHIAL NEEDLE ASPIRATION BIOPSY  04/21/2021   Procedure: BRONCHIAL NEEDLE ASPIRATION BIOPSIES;  Surgeon: [name], DO;  Location: [facility];  Service: Pulmonary;;   CHEST TUBE INSERTION Right 01/01/2018   Procedure: INSERTION PLEURAL DRAINAGE CATHETER;  Surgeon: Ivin Poot, MD;  Location: [facility] OR;  Service: Thoracic;  Laterality: Right;   CHEST TUBE INSERTION  04/21/2021   Procedure: CHEST TUBE INSERTION;  Surgeon: [name], DO;  Location: [facility];  Service: Pulmonary;;   COLONOSCOPY     CORONARY STENT INTERVENTION N/A 06/16/2019   Procedure: CORONARY STENT INTERVENTION;  Surgeon: Jettie Booze, MD;  Location: [facility];  Service: Cardiovascular;  Laterality: N/A;   DILATION AND CURETTAGE OF UTERUS     EYE SURGERY     due to Glaucoma   IR IMAGING GUIDED PORT INSERTION  03/22/2021   IR PORT REPAIR CENTRAL VENOUS ACCESS DEVICE  04/15/2021   LEFT HEART CATH AND CORONARY ANGIOGRAPHY N/A 06/16/2019   Procedure: LEFT HEART CATH AND CORONARY  ANGIOGRAPHY;  Surgeon: Jettie Booze, MD;  Location: [facility];  Service: Cardiovascular;  Laterality: N/A;   REMOVAL OF PLEURAL DRAINAGE CATHETER Right 11/07/2018   Procedure: REMOVAL OF PLEURAL DRAINAGE CATHETER;  Surgeon: Ivin Poot, MD;  Location: [facility];  Service: Thoracic;  Laterality: Right;   ROTATOR CUFF REPAIR     TUBAL LIGATION     VIDEO BRONCHOSCOPY WITH ENDOBRONCHIAL NAVIGATION Bilateral 04/21/2021   Procedure: VIDEO BRONCHOSCOPY WITH ENDOBRONCHIAL NAVIGATION;  Surgeon: [name], DO;  Location: [facility];  Service: Pulmonary;  Laterality: Bilateral;  ION   WISDOM TOOTH EXTRACTION     FAMILY HISTORY Family History  Problem Relation Age of Onset   Heart disease Sister    Heart disease Brother    Lung cancer Other    SOCIAL HISTORY Social History   Tobacco Use   Smoking status: Never   Smokeless tobacco: Never  Vaping Use   Vaping Use: Never used  Substance Use Topics   Alcohol use: Not Currently    Comment: up to 3 drinks per week   Drug use: No    Comment: CBD oil        OPHTHALMIC EXAM:  Base Eye Exam     Visual Acuity (Snellen - Linear)       Right Left   Dist cc 20/30 20/20   Dist ph cc NI     Correction: Glasses         Tonometry (Tonopen, 1:16 PM)       Right Left   Pressure 17 21         Pupils       Dark Light Shape React APD   Right 4 3 Round Brisk None   Left 4 3 Round Brisk None         Visual Fields       Left Right    Full Full         Extraocular Movement       Right Left    Full, Ortho Full, Ortho         Neuro/Psych     Oriented x3: Yes  Mood/Affect: Normal         Dilation     Both eyes: 1.0% Mydriacyl, 2.5% Phenylephrine @ 1:11 PM           Slit Lamp and Fundus Exam     Slit Lamp Exam       Right Left   Lids/Lashes Dermatochalasis - upper lid Dermatochalasis - upper lid   Conjunctiva/Sclera mild melanosis mild melanosis   Cornea well healed cataract wound  Clear   Anterior Chamber deep and clear deep and clear   Iris Round and dilated Round and dilated   Lens PC IOL in good position, trace Posterior capsular opacification 2-3+ Nuclear sclerosis, 2-3+ Cortical cataract, 1-2+ Posterior subcapsular cataract   Anterior Vitreous Vitreous syneresis Vitreous syneresis         Fundus Exam       Right Left   Disc mild Pallor, Sharp rim, +cupping, this superior rim, mild PPA Pink and Sharp, +cupping, +PPP   C/D Ratio 0.85 0.7   Macula Flat, Blunted foveal reflex, ERM with striae, no heme Flat, Good foveal reflex, RPE mottling and clumping, No heme or edema   Vessels attenuated, Tortuous, mild AV crossing changes mild attenuation, mild tortuosity   Periphery Attached, No heme Attached, No heme            IMAGING AND PROCEDURES  Imaging and Procedures for 05/17/2022  OCT, Retina - OU - Both Eyes       Right Eye Quality was good. Scan locations included subfoveal. Central Foveal Thickness: 552. Progression has no prior data. Findings include no SRF, abnormal foveal contour, epiretinal membrane, intraretinal fluid, macular pucker, preretinal fibrosis (ERM with cystic changes, pucker and central thicking).   Left Eye Quality was good. Scan locations included subfoveal. Central Foveal Thickness: 227. Progression has no prior data. Findings include normal foveal contour, no IRF, no SRF (Trace ERM).   Notes *Images captured and stored on drive  Diagnosis / Impression:  OD: ERM with cystic changes, pucker and central thicking OS: Trace ERM  Clinical management:  See below  Abbreviations: NFP - Normal foveal profile. CME - cystoid macular edema. PED - pigment epithelial detachment. IRF - intraretinal fluid. SRF - subretinal fluid. EZ - ellipsoid zone. ERM - epiretinal membrane. ORA - outer retinal atrophy. ORT - outer retinal tubulation. SRHM - subretinal hyper-reflective material. IRHM - intraretinal hyper-reflective material               ASSESSMENT/PLAN:    ICD-10-CM   1. Epiretinal membrane, right eye  H35.371 OCT, Retina - OU - Both Eyes    2. Essential hypertension  I10     3. Hypertensive retinopathy of both eyes  H35.033     4. Combined forms of age-related cataract of left eye  H25.812     5. Pseudophakia  Z96.1      Epiretinal membrane, right eye  - The natural history, anatomy, potential for loss of vision, and treatment options including vitrectomy techniques and the complications of endophthalmitis, retinal detachment, vitreous hemorrhage, cataract progression and permanent vision loss discussed with the patient. - OCT shows ERM with cystic changes, pucker and central thicking - BCVA 20/30 - +metamorphopsia - pt wishes to monitor for now -- reasonable - f/u 4-6 mos, sooner prn -- DFE/OCT  2,3. Hypertensive retinopathy OU - discussed importance of tight BP control - monitor  4. Mixed Cataract OS - The symptoms of cataract, surgical options, and treatments and risks were discussed with patient. -  discussed diagnosis and progression - under the expert management of Dr. Shirleen Schirmer  5. Pseudophakia OD  - s/p CE/IOL OD (Dr. Shirleen Schirmer)  - IOL in good position, doing well  - monitor  Ophthalmic Meds Ordered this visit:  No orders of the defined types were placed in this encounter.    Return for f/u 4-6 months, ERM OD, DFE, OCT.  There are no Patient Instructions on file for this visit.   Explained the diagnoses, plan, and follow up with the patient and they expressed understanding.  Patient expressed understanding of the importance of proper follow up care.   This document serves as a record of services personally performed by Gardiner Sleeper, MD, PhD. It was created on their behalf by Orvan Falconer, an ophthalmic technician. The creation of this record is the provider's dictation and/or activities during the visit.    Electronically signed by: Orvan Falconer, OA, 05/18/22  3:54 PM  This  document serves as a record of services personally performed by Gardiner Sleeper, MD, PhD. It was created on their behalf by San Jetty. Owens Shark, OA an ophthalmic technician. The creation of this record is the provider's dictation and/or activities during the visit.    Electronically signed by: San Jetty. Owens Shark, [place] 11.29.2023 3:54 PM  Gardiner Sleeper, M.D., Ph.D. Diseases & Surgery of the Retina and Vitreous [facility]  I have reviewed the above documentation for accuracy and completeness, and I agree with the above. Gardiner Sleeper, M.D., Ph.D. 05/18/22 4:09 PM   Abbreviations: M myopia (nearsighted); A astigmatism; H hyperopia (farsighted); P presbyopia; Mrx spectacle prescription;  CTL contact lenses; OD right eye; OS left eye; OU both eyes  XT exotropia; ET esotropia; PEK punctate epithelial keratitis; PEE punctate epithelial erosions; DES dry eye syndrome; MGD meibomian gland dysfunction; ATs artificial tears; PFAT's preservative free artificial tears; [place] nuclear sclerotic cataract; PSC posterior subcapsular cataract; ERM epi-retinal membrane; PVD posterior vitreous detachment; RD retinal detachment; DM diabetes mellitus; DR diabetic retinopathy; NPDR non-proliferative diabetic retinopathy; PDR proliferative diabetic retinopathy; CSME clinically significant macular edema; DME diabetic macular edema; dbh dot blot hemorrhages; CWS cotton wool spot; POAG primary open angle glaucoma; C/D cup-to-disc ratio; HVF humphrey visual field; GVF goldmann visual field; OCT optical coherence tomography; IOP intraocular pressure; BRVO Branch retinal vein occlusion; CRVO central retinal vein occlusion; CRAO central retinal artery occlusion; BRAO branch retinal artery occlusion; RT retinal tear; SB scleral buckle; PPV pars plana vitrectomy; VH Vitreous hemorrhage; PRP panretinal laser photocoagulation; IVK intravitreal kenalog; VMT vitreomacular traction; MH Macular hole;  NVD neovascularization of  the disc; NVE neovascularization elsewhere; AREDS age related eye disease study; ARMD age related macular degeneration; POAG primary open angle glaucoma; EBMD epithelial/anterior basement membrane dystrophy; ACIOL anterior chamber intraocular lens; IOL intraocular lens; PCIOL posterior chamber intraocular lens; Phaco/IOL phacoemulsification with intraocular lens placement; [place] photorefractive keratectomy; LASIK laser assisted in situ keratomileusis; HTN hypertension; DM diabetes mellitus; COPD chronic obstructive pulmonary disease

## 2022-05-16 NOTE — Telephone Encounter (Signed)
Med refill requested for Tagrisso,

## 2022-05-17 ENCOUNTER — Encounter (INDEPENDENT_AMBULATORY_CARE_PROVIDER_SITE_OTHER): Payer: Self-pay | Admitting: Ophthalmology

## 2022-05-17 ENCOUNTER — Ambulatory Visit (INDEPENDENT_AMBULATORY_CARE_PROVIDER_SITE_OTHER): Payer: Medicare HMO | Admitting: Ophthalmology

## 2022-05-17 DIAGNOSIS — I10 Essential (primary) hypertension: Secondary | ICD-10-CM | POA: Diagnosis not present

## 2022-05-17 DIAGNOSIS — H25812 Combined forms of age-related cataract, left eye: Secondary | ICD-10-CM | POA: Diagnosis not present

## 2022-05-17 DIAGNOSIS — H35371 Puckering of macula, right eye: Secondary | ICD-10-CM

## 2022-05-17 DIAGNOSIS — H3581 Retinal edema: Secondary | ICD-10-CM

## 2022-05-17 DIAGNOSIS — H35033 Hypertensive retinopathy, bilateral: Secondary | ICD-10-CM

## 2022-05-17 DIAGNOSIS — Z961 Presence of intraocular lens: Secondary | ICD-10-CM

## 2022-05-18 ENCOUNTER — Other Ambulatory Visit: Payer: Medicare HMO

## 2022-05-18 ENCOUNTER — Inpatient Hospital Stay: Payer: Medicare HMO

## 2022-05-18 ENCOUNTER — Inpatient Hospital Stay: Payer: Medicare HMO | Admitting: Physician Assistant

## 2022-05-18 VITALS — BP 155/69 | HR 72 | Temp 98.3°F | Resp 16 | Wt 123.7 lb

## 2022-05-18 DIAGNOSIS — C3491 Malignant neoplasm of unspecified part of right bronchus or lung: Secondary | ICD-10-CM

## 2022-05-18 DIAGNOSIS — Z5111 Encounter for antineoplastic chemotherapy: Secondary | ICD-10-CM | POA: Diagnosis not present

## 2022-05-18 DIAGNOSIS — Z95828 Presence of other vascular implants and grafts: Secondary | ICD-10-CM

## 2022-05-18 LAB — CMP (CANCER CENTER ONLY)
ALT: 33 U/L (ref 0–44)
AST: 31 U/L (ref 15–41)
Albumin: 4 g/dL (ref 3.5–5.0)
Alkaline Phosphatase: 65 U/L (ref 38–126)
Anion gap: 9 (ref 5–15)
BUN: 21 mg/dL (ref 8–23)
CO2: 26 mmol/L (ref 22–32)
Calcium: 9.7 mg/dL (ref 8.9–10.3)
Chloride: 106 mmol/L (ref 98–111)
Creatinine: 1.23 mg/dL — ABNORMAL HIGH (ref 0.44–1.00)
GFR, Estimated: 47 mL/min — ABNORMAL LOW (ref >60)
Glucose, Bld: 121 mg/dL — ABNORMAL HIGH (ref 70–99)
Potassium: 3.9 mmol/L (ref 3.5–5.1)
Sodium: 141 mmol/L (ref 135–145)
Total Bilirubin: 0.3 mg/dL (ref 0.3–1.2)
Total Protein: 7.2 g/dL (ref 6.5–8.1)

## 2022-05-18 LAB — CBC WITH DIFFERENTIAL (CANCER CENTER ONLY)
Abs Immature Granulocytes: 0.01 10*3/uL (ref 0.00–0.07)
Basophils Absolute: 0 10*3/uL (ref 0.0–0.1)
Basophils Relative: 0 %
Eosinophils Absolute: 0 10*3/uL (ref 0.0–0.5)
Eosinophils Relative: 0 %
HCT: 33.3 % — ABNORMAL LOW (ref 36.0–46.0)
Hemoglobin: 10.7 g/dL — ABNORMAL LOW (ref 12.0–15.0)
Immature Granulocytes: 0 %
Lymphocytes Relative: 14 %
Lymphs Abs: 0.6 10*3/uL — ABNORMAL LOW (ref 0.7–4.0)
MCH: 28.8 pg (ref 26.0–34.0)
MCHC: 32.1 g/dL (ref 30.0–36.0)
MCV: 89.5 fL (ref 80.0–100.0)
Monocytes Absolute: 0.3 10*3/uL (ref 0.1–1.0)
Monocytes Relative: 8 %
Neutro Abs: 3.3 10*3/uL (ref 1.7–7.7)
Neutrophils Relative %: 78 %
Platelet Count: 185 10*3/uL (ref 150–400)
RBC: 3.72 MIL/uL — ABNORMAL LOW (ref 3.87–5.11)
RDW: 14.1 % (ref 11.5–15.5)
WBC Count: 4.2 10*3/uL (ref 4.0–10.5)
nRBC: 0 % (ref 0.0–0.2)

## 2022-05-18 MED ORDER — SODIUM CHLORIDE 0.9 % IV SOLN
400.0000 mg/m2 | Freq: Once | INTRAVENOUS | Status: AC
  Administered 2022-05-18: 600 mg via INTRAVENOUS
  Filled 2022-05-18: qty 20

## 2022-05-18 MED ORDER — HEPARIN SOD (PORK) LOCK FLUSH 100 UNIT/ML IV SOLN
500.0000 [IU] | Freq: Once | INTRAVENOUS | Status: AC | PRN
  Administered 2022-05-18: 500 [IU]

## 2022-05-18 MED ORDER — CYANOCOBALAMIN 1000 MCG/ML IJ SOLN
1000.0000 ug | Freq: Once | INTRAMUSCULAR | Status: AC
  Administered 2022-05-18: 1000 ug via INTRAMUSCULAR
  Filled 2022-05-18: qty 1

## 2022-05-18 MED ORDER — SODIUM CHLORIDE 0.9 % IV SOLN: Freq: Once | INTRAVENOUS | Status: AC

## 2022-05-18 MED ORDER — ONDANSETRON HCL 4 MG/2ML IJ SOLN
8.0000 mg | Freq: Once | INTRAMUSCULAR | Status: AC
  Administered 2022-05-18: 8 mg via INTRAVENOUS
  Filled 2022-05-18: qty 4

## 2022-05-18 MED ORDER — SODIUM CHLORIDE 0.9% FLUSH
10.0000 mL | Freq: Once | INTRAVENOUS | Status: AC
  Administered 2022-05-18: 10 mL

## 2022-05-18 MED ORDER — SODIUM CHLORIDE 0.9% FLUSH
10.0000 mL | INTRAVENOUS | Status: DC | PRN
  Administered 2022-05-18: 10 mL

## 2022-05-18 NOTE — Progress Notes (Signed)
Per Cassie, PA-C OK to proceed with tx today with CrCl of 36.9 mL/min

## 2022-05-18 NOTE — Patient Instructions (Signed)
[facility] ONCOLOGY  Discharge Instructions: Thank you for choosing [facility] to provide your oncology and hematology care.   If you have a lab appointment with the [facility], please go directly to the [facility] and check in at the registration area.   Wear comfortable clothing and clothing appropriate for easy access to any Portacath or PICC line.   We strive to give you quality time with your provider. You may need to reschedule your appointment if you arrive late (15 or more minutes).  Arriving late affects you and other patients whose appointments are after yours.  Also, if you miss three or more appointments without notifying the office, you may be dismissed from the clinic at the provider's discretion.      For prescription refill requests, have your pharmacy contact our office and allow 72 hours for refills to be completed.    Today you received the following chemotherapy and/or immunotherapy agents: Alimta      To help prevent nausea and vomiting after your treatment, we encourage you to take your nausea medication as directed.  BELOW ARE SYMPTOMS THAT SHOULD BE REPORTED IMMEDIATELY: *FEVER GREATER THAN 100.4 F (38 C) OR HIGHER *CHILLS OR SWEATING *NAUSEA AND VOMITING THAT IS NOT CONTROLLED WITH YOUR NAUSEA MEDICATION *UNUSUAL SHORTNESS OF BREATH *UNUSUAL BRUISING OR BLEEDING *URINARY PROBLEMS (pain or burning when urinating, or frequent urination) *BOWEL PROBLEMS (unusual diarrhea, constipation, pain near the anus) TENDERNESS IN MOUTH AND THROAT WITH OR WITHOUT PRESENCE OF ULCERS (sore throat, sores in mouth, or a toothache) UNUSUAL RASH, SWELLING OR PAIN  UNUSUAL VAGINAL DISCHARGE OR ITCHING   Items with * indicate a potential emergency and should be followed up as soon as possible or go to the Emergency Department if any problems should occur.  Please show the CHEMOTHERAPY ALERT CARD or IMMUNOTHERAPY ALERT CARD at check-in to the  Emergency Department and triage nurse.  Should you have questions after your visit or need to cancel or reschedule your appointment, please contact [facility]  Dept: [phone]  and follow the prompts.  Office hours are 8:00 a.m. to 4:30 p.m. Monday - Friday. Please note that voicemails left after 4:00 p.m. may not be returned until the following business day.  We are closed weekends and major holidays. You have access to a nurse at all times for urgent questions. Please call the main number to the clinic Dept: [phone] and follow the prompts.   For any non-urgent questions, you may also contact your provider using MyChart. We now offer e-Visits for anyone 55 and older to request care online for non-urgent symptoms. For details visit [email or web address].   Also download the MyChart app! Go to the app store, search "MyChart", open the app, select [facility], and log in with your MyChart username and password.  Masks are optional in the cancer centers. If you would like for your care team to wear a mask while they are taking care of you, please let them know. You may have one support person who is at least 71 years old accompany you for your appointments.

## 2022-05-20 ENCOUNTER — Other Ambulatory Visit: Payer: Self-pay | Admitting: Physician Assistant

## 2022-05-20 DIAGNOSIS — C3491 Malignant neoplasm of unspecified part of right bronchus or lung: Secondary | ICD-10-CM

## 2022-05-25 NOTE — Progress Notes (Signed)
Allison [name], counseling intern, called patient for their scheduled counseling session.   Patient reported they were not feeling well and had to lie down. The patient reported they have been feeling "stuck" in their anxiety. The patient and counselor plan on writing down interventions during our next visit the patient can refer back to when they are feeling overwhelmed with anxiety.   The counselor told the patient she would call her in the new year to set up a counseling session.   Allison [name],  Counseling Intern  [phone] [email or web address]

## 2022-06-03 ENCOUNTER — Other Ambulatory Visit: Payer: Self-pay | Admitting: Cardiology

## 2022-06-05 ENCOUNTER — Encounter: Payer: Self-pay | Admitting: Cardiology

## 2022-06-05 ENCOUNTER — Ambulatory Visit (HOSPITAL_COMMUNITY)
Admission: RE | Admit: 2022-06-05 | Discharge: 2022-06-05 | Disposition: A | Discharge status: Home / Self Care | Payer: Medicare HMO | Source: Ambulatory Visit | Attending: Physician Assistant | Admitting: Physician Assistant

## 2022-06-05 DIAGNOSIS — C3491 Malignant neoplasm of unspecified part of right bronchus or lung: Secondary | ICD-10-CM | POA: Insufficient documentation

## 2022-06-05 MED ORDER — HEPARIN SOD (PORK) LOCK FLUSH 100 UNIT/ML IV SOLN
500.0000 [IU] | Freq: Once | INTRAVENOUS | Status: AC
  Administered 2022-06-05: 500 [IU] via INTRAVENOUS

## 2022-06-05 MED ORDER — SODIUM CHLORIDE (PF) 0.9 % IJ SOLN
INTRAMUSCULAR | Status: AC
  Filled 2022-06-05: qty 50

## 2022-06-05 MED ORDER — IOHEXOL 300 MG/ML  SOLN
75.0000 mL | Freq: Once | INTRAMUSCULAR | Status: AC | PRN
  Administered 2022-06-05: 75 mL via INTRAVENOUS

## 2022-06-05 MED ORDER — HEPARIN SOD (PORK) LOCK FLUSH 100 UNIT/ML IV SOLN
INTRAVENOUS | Status: AC
  Filled 2022-06-05: qty 5

## 2022-06-07 MED ORDER — AMLODIPINE BESYLATE 5 MG PO TABS: 7.5000 mg | ORAL_TABLET | Freq: Every day | ORAL | Status: DC

## 2022-06-07 MED ORDER — CARVEDILOL 25 MG PO TABS: 12.5000 mg | ORAL_TABLET | Freq: Two times a day (BID) | ORAL | Status: DC

## 2022-06-07 NOTE — Telephone Encounter (Signed)
We can try decreasing carvedilol back to 12.5 mg twice daily to see if that improves her lightheadedness.  Would increase amlodipine to 7.5 mg daily with decreasing her carvedilol

## 2022-06-08 ENCOUNTER — Encounter: Payer: Self-pay | Admitting: General Practice

## 2022-06-08 ENCOUNTER — Inpatient Hospital Stay: Payer: Medicare HMO | Attending: Internal Medicine

## 2022-06-08 ENCOUNTER — Inpatient Hospital Stay: Payer: Medicare HMO

## 2022-06-08 ENCOUNTER — Encounter: Payer: Self-pay | Admitting: Nurse Practitioner

## 2022-06-08 ENCOUNTER — Inpatient Hospital Stay (HOSPITAL_BASED_OUTPATIENT_CLINIC_OR_DEPARTMENT_OTHER): Payer: Medicare HMO | Admitting: Internal Medicine

## 2022-06-08 ENCOUNTER — Inpatient Hospital Stay (HOSPITAL_BASED_OUTPATIENT_CLINIC_OR_DEPARTMENT_OTHER): Payer: Medicare HMO | Admitting: Nurse Practitioner

## 2022-06-08 ENCOUNTER — Encounter: Payer: Self-pay | Admitting: Internal Medicine

## 2022-06-08 VITALS — BP 156/74 | HR 72 | Temp 98.4°F | Resp 15 | Wt 123.6 lb

## 2022-06-08 DIAGNOSIS — C3490 Malignant neoplasm of unspecified part of unspecified bronchus or lung: Secondary | ICD-10-CM

## 2022-06-08 DIAGNOSIS — R5383 Other fatigue: Secondary | ICD-10-CM | POA: Insufficient documentation

## 2022-06-08 DIAGNOSIS — R110 Nausea: Secondary | ICD-10-CM | POA: Diagnosis not present

## 2022-06-08 DIAGNOSIS — R0609 Other forms of dyspnea: Secondary | ICD-10-CM | POA: Diagnosis not present

## 2022-06-08 DIAGNOSIS — C3491 Malignant neoplasm of unspecified part of right bronchus or lung: Secondary | ICD-10-CM

## 2022-06-08 DIAGNOSIS — Z8673 Personal history of transient ischemic attack (TIA), and cerebral infarction without residual deficits: Secondary | ICD-10-CM | POA: Diagnosis not present

## 2022-06-08 DIAGNOSIS — R530 Neoplastic (malignant) related fatigue: Secondary | ICD-10-CM | POA: Diagnosis not present

## 2022-06-08 DIAGNOSIS — D649 Anemia, unspecified: Secondary | ICD-10-CM | POA: Insufficient documentation

## 2022-06-08 DIAGNOSIS — Z95828 Presence of other vascular implants and grafts: Secondary | ICD-10-CM

## 2022-06-08 DIAGNOSIS — Z66 Do not resuscitate: Secondary | ICD-10-CM | POA: Insufficient documentation

## 2022-06-08 DIAGNOSIS — Z923 Personal history of irradiation: Secondary | ICD-10-CM | POA: Diagnosis not present

## 2022-06-08 DIAGNOSIS — F419 Anxiety disorder, unspecified: Secondary | ICD-10-CM

## 2022-06-08 DIAGNOSIS — R599 Enlarged lymph nodes, unspecified: Secondary | ICD-10-CM | POA: Insufficient documentation

## 2022-06-08 DIAGNOSIS — Z5111 Encounter for antineoplastic chemotherapy: Secondary | ICD-10-CM | POA: Diagnosis not present

## 2022-06-08 DIAGNOSIS — Z515 Encounter for palliative care: Secondary | ICD-10-CM

## 2022-06-08 DIAGNOSIS — C3411 Malignant neoplasm of upper lobe, right bronchus or lung: Secondary | ICD-10-CM | POA: Diagnosis present

## 2022-06-08 LAB — CMP (CANCER CENTER ONLY)
ALT: 45 U/L — ABNORMAL HIGH (ref 0–44)
AST: 47 U/L — ABNORMAL HIGH (ref 15–41)
Albumin: 3.8 g/dL (ref 3.5–5.0)
Alkaline Phosphatase: 68 U/L (ref 38–126)
Anion gap: 7 (ref 5–15)
BUN: 23 mg/dL (ref 8–23)
CO2: 26 mmol/L (ref 22–32)
Calcium: 9.6 mg/dL (ref 8.9–10.3)
Chloride: 107 mmol/L (ref 98–111)
Creatinine: 1.23 mg/dL — ABNORMAL HIGH (ref 0.44–1.00)
GFR, Estimated: 47 mL/min — ABNORMAL LOW (ref >60)
Glucose, Bld: 152 mg/dL — ABNORMAL HIGH (ref 70–99)
Potassium: 3.4 mmol/L — ABNORMAL LOW (ref 3.5–5.1)
Sodium: 140 mmol/L (ref 135–145)
Total Bilirubin: 0.2 mg/dL — ABNORMAL LOW (ref 0.3–1.2)
Total Protein: 6.9 g/dL (ref 6.5–8.1)

## 2022-06-08 LAB — CBC WITH DIFFERENTIAL (CANCER CENTER ONLY)
Abs Immature Granulocytes: 0.01 10*3/uL (ref 0.00–0.07)
Basophils Absolute: 0 10*3/uL (ref 0.0–0.1)
Basophils Relative: 1 %
Eosinophils Absolute: 0 10*3/uL (ref 0.0–0.5)
Eosinophils Relative: 1 %
HCT: 31.4 % — ABNORMAL LOW (ref 36.0–46.0)
Hemoglobin: 10.2 g/dL — ABNORMAL LOW (ref 12.0–15.0)
Immature Granulocytes: 0 %
Lymphocytes Relative: 22 %
Lymphs Abs: 0.8 10*3/uL (ref 0.7–4.0)
MCH: 28.7 pg (ref 26.0–34.0)
MCHC: 32.5 g/dL (ref 30.0–36.0)
MCV: 88.2 fL (ref 80.0–100.0)
Monocytes Absolute: 0.3 10*3/uL (ref 0.1–1.0)
Monocytes Relative: 8 %
Neutro Abs: 2.7 10*3/uL (ref 1.7–7.7)
Neutrophils Relative %: 68 %
Platelet Count: 199 10*3/uL (ref 150–400)
RBC: 3.56 MIL/uL — ABNORMAL LOW (ref 3.87–5.11)
RDW: 13.7 % (ref 11.5–15.5)
WBC Count: 3.9 10*3/uL — ABNORMAL LOW (ref 4.0–10.5)
nRBC: 0 % (ref 0.0–0.2)

## 2022-06-08 MED ORDER — ONDANSETRON HCL 4 MG/2ML IJ SOLN
8.0000 mg | Freq: Once | INTRAMUSCULAR | Status: AC
  Administered 2022-06-08: 8 mg via INTRAVENOUS
  Filled 2022-06-08: qty 4

## 2022-06-08 MED ORDER — SODIUM CHLORIDE 0.9% FLUSH
10.0000 mL | Freq: Once | INTRAVENOUS | Status: AC
  Administered 2022-06-08: 10 mL

## 2022-06-08 MED ORDER — SODIUM CHLORIDE 0.9% FLUSH
10.0000 mL | INTRAVENOUS | Status: DC | PRN
  Administered 2022-06-08: 10 mL

## 2022-06-08 MED ORDER — SODIUM CHLORIDE 0.9 % IV SOLN: Freq: Once | INTRAVENOUS | Status: AC

## 2022-06-08 MED ORDER — HEPARIN SOD (PORK) LOCK FLUSH 100 UNIT/ML IV SOLN
500.0000 [IU] | Freq: Once | INTRAVENOUS | Status: AC | PRN
  Administered 2022-06-08: 500 [IU]

## 2022-06-08 MED ORDER — SODIUM CHLORIDE 0.9 % IV SOLN
400.0000 mg/m2 | Freq: Once | INTRAVENOUS | Status: AC
  Administered 2022-06-08: 600 mg via INTRAVENOUS
  Filled 2022-06-08: qty 4

## 2022-06-08 NOTE — Progress Notes (Signed)
[place]  [phone] [phone]   Name: Allison [name] Date: 06/08/2022 MRN: [id number]  DOB: July 09, 1950  Patient Care Team: [name], MD as PCP - General (Internal Medicine) Donato Heinz, MD as PCP - Cardiology (Cardiology) [name], MD as Consulting Physician (Radiation Oncology) Pickenpack-Cousar, Carlena Sax, NP as Nurse Practitioner (Nurse Practitioner) Valrie Hart, RN as Oncology Nurse Navigator (Oncology) Curt Bears, MD as Consulting Physician (Oncology) Gayland Curry, DO (Geriatric Medicine)   INTERVAL HISTORY: Allison [name] is a 71 y.o. female with stage IV non-small cell lung cancer (12/2017), hypertension, CVA, CAD, DVT/PE (on Lovenox), and GERD.  Palliative ask to see for symptom management and goals of care.  SOCIAL HISTORY:     reports that she has never smoked. She has never used smokeless tobacco. She reports that she does not currently use alcohol. She reports that she does not use drugs.  ADVANCE DIRECTIVES:  None on file   CODE STATUS: DNR  PAST MEDICAL HISTORY: Past Medical History:  Diagnosis Date   Anemia    Anxiety    Arthritis    Asthma    exercise induced   Depression    PMH   Dyspnea    GERD (gastroesophageal reflux disease)    Glaucoma    History of radiation therapy 01/05/2021   IMRT right lung  11/24/2020-01/05/2021  Dr [name]   Hypertension    lung ca dx'd 11/2017   right   Malignant pleural effusion    right   Nuclear sclerotic cataract of right eye 02/28/2021   Dr. Warden Fillers, cataract surgery February 2023   PONV (postoperative nausea and vomiting)    Pre-diabetes    Raynaud's disease    Raynaud's disease    Stroke ([place]) 01/2021   balance off, some express aphasia, weakness    ALLERGIES:  is allergic to hydrocodone-acetaminophen, penicillins, vicodin [hydrocodone-acetaminophen], and other.  MEDICATIONS:  Current  Outpatient Medications  Medication Sig Dispense Refill   acetaminophen (TYLENOL) 500 MG tablet Take 1,000 mg by mouth every 6 (six) hours as needed (pain).     ALPRAZolam (XANAX) 0.25 MG tablet Take 1 tablet (0.25 mg total) by mouth 2 (two) times daily as needed for anxiety. May take 2 tablets (0.5 mg total) at bedtime. 60 tablet 0   amLODipine (NORVASC) 5 MG tablet Take 1.5 tablets (7.5 mg total) by mouth daily.     brompheniramine-pseudoephedrine-DM 30-2-10 MG/5ML syrup Take 5 mLs by mouth 3 (three) times daily as needed. (Patient not taking: Reported on 06/08/2022) 120 mL 0   carvedilol (COREG) 25 MG tablet Take 0.5 tablets (12.5 mg total) by mouth 2 (two) times daily.     cetirizine (ZYRTEC) 5 MG tablet Take 5 mg by mouth daily.     dexamethasone (DECADRON) 4 MG tablet TAKE 1 TABLET TWICE A DAY THE DAY BEFORE, THE DAY OF, AND THE DAY AFTER CHEMOTHERAPY 40 tablet 2   docusate sodium (COLACE) 100 MG capsule Take 1 capsule (100 mg total) by mouth every other day. 15 capsule 5   dorzolamide-timolol (COSOPT) 22.3-6.8 MG/ML ophthalmic solution Place 1 drop into both eyes 2 (two) times daily.     dronabinol (MARINOL) 2.5 MG capsule Take 1 capsule (2.5 mg total) by mouth 2 (two) times daily as needed (decreased appetite). 60 capsule 0   enoxaparin (LOVENOX) 60 MG/0.6ML injection Inject 0.6 mLs (60 mg total) into the skin every 12 (twelve) hours. (Patient taking  differently: Inject 80 mg into the skin daily. 80 mg) 60 mL 1   esomeprazole (NEXIUM) 20 MG capsule Take 1 capsule (20 mg total) by mouth in the morning and at bedtime. 60 capsule 2   ezetimibe (ZETIA) 10 MG tablet Take 10 mg by mouth daily.     FeFum-FePoly-FA-B Cmp-C-Biot (FOLIVANE-PLUS) CAPS TAKE 1 CAPSULE BY MOUTH EVERY DAY IN THE MORNING 90 capsule 0   FLUoxetine (PROZAC) 20 MG capsule Take 1 capsule (20 mg total) by mouth daily. 90 capsule 3   folic acid (FOLVITE) 1 MG tablet Take 1 tablet (1 mg total) by mouth daily. 90 tablet 1    hyoscyamine (LEVSIN SL) 0.125 MG SL tablet Place 2 tablets (0.25 mg total) under the tongue every 6 (six) hours as needed for cramping. Take 1-2 tablets under the tongue every 6 hours as needed for cramping 30 tablet 0   ipratropium-albuterol (DUONEB) 0.5-2.5 (3) MG/3ML SOLN Take 3 mLs by nebulization every 6 (six) hours as needed (Asthma).     isosorbide mononitrate (IMDUR) 30 MG 24 hr tablet TAKE 1 TABLET BY MOUTH EVERY DAY 90 tablet 3   latanoprost (XALATAN) 0.005 % ophthalmic solution Place 1 drop into both eyes at bedtime.      ondansetron (ZOFRAN-ODT) 4 MG disintegrating tablet Take 1 tablet (4 mg total) by mouth every 8 (eight) hours as needed for nausea or vomiting. 30 tablet 1   osimertinib mesylate (TAGRISSO) 80 MG tablet Take 1 tablet (80 mg total) by mouth daily. 30 tablet 4   potassium chloride SA (KLOR-CON M) 20 MEQ tablet Take 1 tablet (20 mEq total) by mouth daily. 7 tablet 0   REPATHA SURECLICK 875 MG/ML SOAJ INJECT 140 MG INTO THE SKIN EVERY 14 (FOURTEEN) DAYS. 6 mL 3   valACYclovir (VALTREX) 500 MG tablet Take 1 tablet twice a day by oral route for 5 days. (Patient not taking: Reported on 06/08/2022)     No current facility-administered medications for this visit.   Facility-Administered Medications Ordered in Other Visits  Medication Dose Route Frequency Provider Last Rate Last Admin   sodium chloride flush (NS) 0.9 % injection 10 mL  10 mL Intracatheter PRN Curt Bears, MD   10 mL at 06/08/22 1040    VITAL SIGNS: LMP  (LMP Unknown)  There were no vitals filed for this visit.   Estimated body mass index is 21.22 kg/m as calculated from the following:   Height as of 04/06/22: 5\' 4"  (1.626 m).   Weight as of an earlier encounter on 06/08/22: 123 lb 9.6 oz (56.1 kg).   PERFORMANCE STATUS (ECOG) : 1 - Symptomatic but completely ambulatory   IMPRESSION:  I saw Allison [name] during her infusion for symptom management follow-up.  No acute distress identified. Appetite  continues to improve.  Weight is stable at 123 pounds.  Nausea is much improved.  Remaining as active as possible.  Looking forward to spending time with her family over the holidays.  No new concerns or symptom management needs.  Fatigue  Resolved. Reports energy and activity level is great. She is able to to do many of the things she enjoys.    Anxiety Overall patient's anxiety is improved.  She is implementing anxiety relieving methods as discussed and taught during her visits with the spiritual support team here at the cancer center.  She shares today is not the best day. Khaniyah continues to focus on relaxation techniques, focused breathing, and guided imagery. Able to sleep better throughout  the night. She is also followed by home palliative with [name]. Will continue to take things day by day and practice relaxation.  3. Nausea  Ms. Name shares she is experiencing increased nausea. She has not been taking her home zofran. Advised to take as needed. She verbalized understanding.   PLAN: Symptoms are well controlled. Appetite is much improved. She is being followed by the dietician.  Nexium twice daily for indigestion. Tolerating well.  Anxiety well controlled. Occasional use of Xanax at bedtime.   Zofran as needed for nausea. I will plan to see back in 6-8 weeks in collaboration with her other oncology appointments.    Patient expressed understanding and was in agreement with this plan. She also understands that She can call the clinic at any time with any questions, concerns, or complaints.     Time Total: 20 min   Visit consisted of counseling and education dealing with the complex and emotionally intense issues of symptom management and palliative care in the setting of serious and potentially life-threatening illness.Greater than 50%  of this time was spent counseling and coordinating care related to the above assessment and plan.  Alda Lea, AGPCNP-BC   Palliative Medicine Team/[place]

## 2022-06-08 NOTE — Progress Notes (Signed)
[facility] [phone]   [phone]  OFFICE PROGRESS NOTE  [name], [place] [address]  DIAGNOSIS: Stage IV (T2 a,N2, M1a) non-small cell lung cancer, adenocarcinoma diagnosed in July 2019 and presented with right upper lobe lung mass in addition to mediastinal lymphadenopathy as well as bilateral pulmonary nodules and malignant right pleural effusion.   Biomarker Findings Microsatellite status - Cannot Be Determined Tumor Mutational Burden - Cannot Be Determined Genomic Findings For a complete list of the genes assayed, please refer to the Appendix. EGFR exon 19 deletion ([id number]) TP53 Y220C 7 Disease relevant genes with no reportable alterations: KRAS, ALK, BRAF, MET, RET, ERBB2, ROS1    PRIOR THERAPY:  1) Status post right Pleurx catheter placement by Dr. Prescott Gum for drainage of malignant right pleural effusion. 2) palliative radiotherapy to the enlarging right upper lobe lung mass and mediastinum under the care of Dr. Sondra Come expected to be completed on January 05, 2021. 3) Tagrisso 80 mg p.o. daily.  First dose was given on 01/29/2018.  Status post 39 months of treatment.   CURRENT THERAPY: Systemic chemotherapy with carboplatin for AUC of 5 and Alimta 500 Mg/M2 every 3 weeks.  First dose 03/17/2021.  The patient will also continue her current treatment with Tagrisso 80 mg p.o. daily.  She is status post 21 cycles. Starting from cycle #7, she will be on Alimta only 400 mg/m2.  INTERVAL HISTORY: CARLISIA GENO 71 y.o. female returns to the clinic today for follow-up visit accompanied by her boyfriend.  The patient is feeling fine today with no concerning complaints except for fatigue.  She denied having any chest pain but has shortness of breath with exertion with no cough or hemoptysis.  She has no nausea, vomiting, diarrhea or constipation.  She has no headache or visual changes.  She denied  having any recent weight loss or night sweats.  She has been tolerating her treatment with maintenance Alimta as well as Tagrisso fairly well.  She had repeat CT scan of the chest performed recently and she is here for evaluation and discussion of her scan results.  MEDICAL HISTORY: Past Medical History:  Diagnosis Date   Anemia    Anxiety    Arthritis    Asthma    exercise induced   Depression    PMH   Dyspnea    GERD (gastroesophageal reflux disease)    Glaucoma    History of radiation therapy 01/05/2021   IMRT right lung  11/24/2020-01/05/2021  Dr [name]   Hypertension    lung ca dx'd 11/2017   right   Malignant pleural effusion    right   Nuclear sclerotic cataract of right eye 02/28/2021   Dr. Warden Fillers, cataract surgery February 2023   PONV (postoperative nausea and vomiting)    Pre-diabetes    Raynaud's disease    Raynaud's disease    Stroke ([facility]) 01/2021   balance off, some express aphasia, weakness    ALLERGIES:  is allergic to hydrocodone-acetaminophen, penicillins, vicodin [hydrocodone-acetaminophen], and other.  MEDICATIONS:  Current Outpatient Medications  Medication Sig Dispense Refill   acetaminophen (TYLENOL) 500 MG tablet Take 1,000 mg by mouth every 6 (six) hours as needed (pain).     ALPRAZolam (XANAX) 0.25 MG tablet Take 1 tablet (0.25 mg total) by mouth 2 (two) times daily as needed for anxiety. May take 2 tablets (0.5 mg total) at bedtime. 60 tablet 0  amLODipine (NORVASC) 5 MG tablet Take 1.5 tablets (7.5 mg total) by mouth daily.     brompheniramine-pseudoephedrine-DM 30-2-10 MG/5ML syrup Take 5 mLs by mouth 3 (three) times daily as needed. 120 mL 0   carvedilol (COREG) 25 MG tablet Take 0.5 tablets (12.5 mg total) by mouth 2 (two) times daily.     cetirizine (ZYRTEC) 5 MG tablet Take 5 mg by mouth daily.     dexamethasone (DECADRON) 4 MG tablet TAKE 1 TABLET TWICE A DAY THE DAY BEFORE, THE DAY OF, AND THE DAY AFTER CHEMOTHERAPY 40 tablet 2    docusate sodium (COLACE) 100 MG capsule Take 1 capsule (100 mg total) by mouth every other day. 15 capsule 5   dorzolamide-timolol (COSOPT) 22.3-6.8 MG/ML ophthalmic solution Place 1 drop into both eyes 2 (two) times daily.     dronabinol (MARINOL) 2.5 MG capsule Take 1 capsule (2.5 mg total) by mouth 2 (two) times daily as needed (decreased appetite). 60 capsule 0   enoxaparin (LOVENOX) 60 MG/0.6ML injection Inject 0.6 mLs (60 mg total) into the skin every 12 (twelve) hours. (Patient taking differently: Inject 80 mg into the skin daily. 80 mg) 60 mL 1   esomeprazole (NEXIUM) 20 MG capsule Take 1 capsule (20 mg total) by mouth in the morning and at bedtime. 60 capsule 2   ezetimibe (ZETIA) 10 MG tablet Take 10 mg by mouth daily.     FeFum-FePoly-FA-B Cmp-C-Biot (FOLIVANE-PLUS) CAPS TAKE 1 CAPSULE BY MOUTH EVERY DAY IN THE MORNING 90 capsule 0   FLUoxetine (PROZAC) 20 MG capsule Take 1 capsule (20 mg total) by mouth daily. 90 capsule 3   folic acid (FOLVITE) 1 MG tablet Take 1 tablet (1 mg total) by mouth daily. 90 tablet 1   hyoscyamine (LEVSIN SL) 0.125 MG SL tablet Place 2 tablets (0.25 mg total) under the tongue every 6 (six) hours as needed for cramping. Take 1-2 tablets under the tongue every 6 hours as needed for cramping 30 tablet 0   ipratropium-albuterol (DUONEB) 0.5-2.5 (3) MG/3ML SOLN Take 3 mLs by nebulization every 6 (six) hours as needed (Asthma).     isosorbide mononitrate (IMDUR) 30 MG 24 hr tablet TAKE 1 TABLET BY MOUTH EVERY DAY 90 tablet 3   latanoprost (XALATAN) 0.005 % ophthalmic solution Place 1 drop into both eyes at bedtime.      ondansetron (ZOFRAN-ODT) 4 MG disintegrating tablet Take 1 tablet (4 mg total) by mouth every 8 (eight) hours as needed for nausea or vomiting. 30 tablet 1   osimertinib mesylate (TAGRISSO) 80 MG tablet Take 1 tablet (80 mg total) by mouth daily. 30 tablet 4   potassium chloride SA (KLOR-CON M) 20 MEQ tablet Take 1 tablet (20 mEq total) by mouth  daily. 7 tablet 0   REPATHA SURECLICK 295 MG/ML SOAJ INJECT 140 MG INTO THE SKIN EVERY 14 (FOURTEEN) DAYS. 6 mL 3   valACYclovir (VALTREX) 500 MG tablet Take 1 tablet twice a day by oral route for 5 days.     No current facility-administered medications for this visit.    SURGICAL HISTORY:  Past Surgical History:  Procedure Laterality Date   ABDOMINAL HYSTERECTOMY     partial   BRONCHIAL BIOPSY  04/21/2021   Procedure: BRONCHIAL BIOPSIES;  Surgeon: [name], DO;  Location: [facility];  Service: Pulmonary;;   BRONCHIAL BRUSHINGS  04/21/2021   Procedure: BRONCHIAL BRUSHINGS;  Surgeon: [name], DO;  Location: [facility];  Service: Pulmonary;;   BRONCHIAL NEEDLE ASPIRATION  BIOPSY  04/21/2021   Procedure: BRONCHIAL NEEDLE ASPIRATION BIOPSIES;  Surgeon: [name], DO;  Location: [facility];  Service: Pulmonary;;   CHEST TUBE INSERTION Right 01/01/2018   Procedure: INSERTION PLEURAL DRAINAGE CATHETER;  Surgeon: Ivin Poot, MD;  Location: [facility] OR;  Service: Thoracic;  Laterality: Right;   CHEST TUBE INSERTION  04/21/2021   Procedure: CHEST TUBE INSERTION;  Surgeon: [name], DO;  Location: [facility];  Service: Pulmonary;;   COLONOSCOPY     CORONARY STENT INTERVENTION N/A 06/16/2019   Procedure: CORONARY STENT INTERVENTION;  Surgeon: Jettie Booze, MD;  Location: [facility];  Service: Cardiovascular;  Laterality: N/A;   DILATION AND CURETTAGE OF UTERUS     EYE SURGERY     due to Glaucoma   IR IMAGING GUIDED PORT INSERTION  03/22/2021   IR PORT REPAIR CENTRAL VENOUS ACCESS DEVICE  04/15/2021   LEFT HEART CATH AND CORONARY ANGIOGRAPHY N/A 06/16/2019   Procedure: LEFT HEART CATH AND CORONARY ANGIOGRAPHY;  Surgeon: Jettie Booze, MD;  Location: [facility];  Service: Cardiovascular;  Laterality: N/A;   REMOVAL OF PLEURAL DRAINAGE CATHETER Right 11/07/2018   Procedure: REMOVAL OF PLEURAL DRAINAGE CATHETER;  Surgeon: Ivin Poot, MD;  Location: [facility];  Service: Thoracic;  Laterality: Right;   ROTATOR CUFF REPAIR     TUBAL LIGATION     VIDEO BRONCHOSCOPY WITH ENDOBRONCHIAL NAVIGATION Bilateral 04/21/2021   Procedure: VIDEO BRONCHOSCOPY WITH ENDOBRONCHIAL NAVIGATION;  Surgeon: [name], DO;  Location: [facility];  Service: Pulmonary;  Laterality: Bilateral;  ION   WISDOM TOOTH EXTRACTION      REVIEW OF SYSTEMS:  Constitutional: positive for fatigue Eyes: negative Ears, nose, mouth, throat, and face: negative Respiratory: positive for dyspnea on exertion Cardiovascular: negative Gastrointestinal: negative Genitourinary:negative Integument/breast: negative Hematologic/lymphatic: negative Musculoskeletal:negative Neurological: negative Behavioral/Psych: negative Endocrine: negative Allergic/Immunologic: negative   PHYSICAL EXAMINATION: General appearance: alert, cooperative, appears stated age, fatigued, and no distress Head: Normocephalic, without obvious abnormality, atraumatic Neck: no adenopathy, no JVD, supple, symmetrical, trachea midline, and thyroid not enlarged, symmetric, no tenderness/mass/nodules Lymph nodes: Cervical, supraclavicular, and axillary nodes normal. Resp: clear to auscultation bilaterally Back: symmetric, no curvature. ROM normal. No CVA tenderness. Cardio: regular rate and rhythm, S1, S2 normal, no murmur, click, rub or gallop GI: soft, non-tender; bowel sounds normal; no masses,  no organomegaly Extremities: extremities normal, atraumatic, no cyanosis or edema Neurologic: Alert and oriented X 3, normal strength and tone. Normal symmetric reflexes. Normal coordination and gait  ECOG PERFORMANCE STATUS: 1 - Symptomatic but completely ambulatory  Blood pressure (!) 156/74, pulse 72, temperature 98.4 F (36.9 C), temperature source Oral, resp. rate 15, weight 123 lb 9.6 oz (56.1 kg), SpO2 100 %.  LABORATORY DATA: Lab Results  Component Value Date   WBC 3.9 (L)  06/08/2022   HGB 10.2 (L) 06/08/2022   HCT 31.4 (L) 06/08/2022   MCV 88.2 06/08/2022   PLT 199 06/08/2022      Chemistry      Component Value Date/Time   NA 140 06/08/2022 0822   NA 140 03/04/2021 1516   K 3.4 (L) 06/08/2022 0822   CL 107 06/08/2022 0822   CO2 26 06/08/2022 0822   BUN 23 06/08/2022 0822   BUN 21 03/04/2021 1516   CREATININE 1.23 (H) 06/08/2022 0822      Component Value Date/Time   CALCIUM 9.6 06/08/2022 0822   ALKPHOS 68 06/08/2022 0822   AST 47 (H) 06/08/2022  0093   ALT 45 (H) 06/08/2022 0822   BILITOT 0.2 (L) 06/08/2022 0822       RADIOGRAPHIC STUDIES: CT Chest W Contrast  Result Date: 06/05/2022 CLINICAL DATA:  Non-small-cell lung cancer. Restaging. * Tracking Code: BO * EXAM: CT CHEST WITH CONTRAST TECHNIQUE: Multidetector CT imaging of the chest was performed during intravenous contrast administration. RADIATION DOSE REDUCTION: This exam was performed according to the departmental dose-optimization program which includes automated exposure control, adjustment of the mA and/or kV according to patient size and/or use of iterative reconstruction technique. CONTRAST:  38m OMNIPAQUE IOHEXOL 300 MG/ML  SOLN COMPARISON:  04/03/2022 FINDINGS: Cardiovascular: The heart size is normal. No substantial pericardial effusion. Coronary artery calcification is evident. Mild atherosclerotic calcification is noted in the wall of the thoracic aorta. Right Port-A-Cath tip is positioned in the right atrium. Mediastinum/Nodes: No mediastinal lymphadenopathy. There is no hilar lymphadenopathy. The esophagus has normal imaging features. There is no axillary lymphadenopathy. Lungs/Pleura: Post treatment scarring in the parahilar right lung extending into the apex is stable in the interval. Volume loss in the right chest is unchanged. Subtle changes of centrilobular emphysema noted left lung. Anterior left lower lobe calcified granuloma again noted. No new suspicious pulmonary nodule or  mass. Stable tiny right pleural effusion. Upper Abdomen: Unremarkable. Musculoskeletal: No worrisome lytic or sclerotic osseous abnormality. IMPRESSION: 1. Stable exam. No new or progressive findings. 2. Stable post treatment scarring in the parahilar right lung extending into the apex. 3. Stable tiny right pleural effusion. 4. Aortic Atherosclerosis (ICD10-I70.0) and Emphysema (ICD10-J43.9). Electronically Signed   By: EMisty StanleyM.D.   On: 06/05/2022 10:57   OCT, Retina - OU - Both Eyes  Result Date: 05/17/2022 Right Eye Quality was good. Scan locations included subfoveal. Central Foveal Thickness: 552. Progression has no prior data. Findings include no SRF, abnormal foveal contour, epiretinal membrane, intraretinal fluid, macular pucker, preretinal fibrosis (ERM with cystic changes, pucker and central thicking). Left Eye Quality was good. Scan locations included subfoveal. Central Foveal Thickness: 227. Progression has no prior data. Findings include normal foveal contour, no IRF, no SRF (Trace ERM). Notes *Images captured and stored on drive Diagnosis / Impression: OD: ERM with cystic changes, pucker and central thicking OS: Trace ERM Clinical management: See below Abbreviations: NFP - Normal foveal profile. CME - cystoid macular edema. PED - pigment epithelial detachment. IRF - intraretinal fluid. SRF - subretinal fluid. EZ - ellipsoid zone. ERM - epiretinal membrane. ORA - outer retinal atrophy. ORT - outer retinal tubulation. SRHM - subretinal hyper-reflective material. IRHM - intraretinal hyper-reflective material    ASSESSMENT AND PLAN: This is a very pleasant 71years old never smoker African-American female recently with a stage IV non-small cell lung cancer, adenocarcinoma with positive EGFR mutation with deletion in exon 19. The patient was started on treatment with Tagrisso 80 mg p.o. daily status post 35 months of treatment. She has been tolerating this treatment well with no concerning  adverse effects except for intermittent diarrhea. She had repeat CT scan of the chest, abdomen pelvis performed recently.  I personally and independently reviewed the scans and discussed the results with the patient and her boyfriend today. Unfortunately the CT scan showed interval progression of the right apical lung mass in addition to progression of mediastinal lymphadenopathy concerning for worsening of her disease. She has no actionable resistant mutation on the molecular studies performed by [facility]. The patient continued her current treatment with Tagrisso and tolerating it fairly well. She underwent palliative  radiotherapy to the enlarging right upper lobe lung mass in addition to the mediastinal lymphadenopathy under the care of Dr. Sondra Come completed January 05, 2021. The patient had significant opacities in her lung that was initially thought to be secondary to radiation treatment versus Tagrisso induced pneumonitis versus lymphangitic spread of the tumor.  She was treated with high-dose taper regiment of prednisone Repeat imaging studies after the palliative radiotherapy showed evidence for disease progression. Her molecular studies by [facility] recently showed no new resistant mutation.  After discussion of her treatment options including palliative care and hospice referral versus palliative systemic chemotherapy the patient was interested in proceeding palliative systemic chemotherapy.  She started  palliative systemic chemotherapy with carboplatin for AUC of 5 and Alimta 500 Mg/M2 every 3 weeks.  Status post 21 cycles.  Starting from cycle #7 the patient is on treatment with single agent Alimta every 3 weeks. I did not add a Avastin to her treatment because of the recent stroke.  She also continued her treatment with Tagrisso at the same time.  The patient has been tolerating her maintenance treatment with Alimta and Tagrisso fairly well. She had repeat CT scan of the chest performed  recently.  I personally and independently reviewed the scan and discussed the result with the patient and her boyfriend. Her scan showed no concerning findings for disease progression. I recommended for her to continue her current treatment with Alimta and she will proceed with cycle #22 today. The patient will come back for follow-up visit in 3 weeks for evaluation before the next cycle of her treatment. She will also continue her current treatment with Tagrisso. For the anemia, she will continue with the oral iron tablets. She was advised to call immediately if she has any other concerning symptoms in the interval.  The patient voices understanding of current disease status and treatment options and is in agreement with the current care plan.  All questions were answered. The patient knows to call the clinic with any problems, questions or concerns. We can certainly see the patient much sooner if necessary.  he total time spent in the appointment was 30 minutes.  Disclaimer: This note was dictated with voice recognition software. Similar sounding words can inadvertently be transcribed and may not be corrected upon review.

## 2022-06-08 NOTE — Progress Notes (Signed)
[facility] Spiritual Care Note  Follow [place] through [facility] and visited in infusion today for pastoral check-in. She reports with gratitude that she is receiving very helpful support from [employer], even though her energy is often limited after treatment. Bryer also notes that a poetry book has been bringing her beauty and solace.  We limited our visit to defer to [facility] Cousar/NP from [place] and plan to follow up through group or whenever else needed in the new year.   [place], [place], [facility] Pager [phone] Voicemail [phone]

## 2022-06-08 NOTE — Patient Instructions (Signed)
[facility] ONCOLOGY  Discharge Instructions: Thank you for choosing [facility] to provide your oncology and hematology care.   If you have a lab appointment with the [facility], please go directly to the [facility] and check in at the registration area.   Wear comfortable clothing and clothing appropriate for easy access to any Portacath or PICC line.   We strive to give you quality time with your provider. You may need to reschedule your appointment if you arrive late (15 or more minutes).  Arriving late affects you and other patients whose appointments are after yours.  Also, if you miss three or more appointments without notifying the office, you may be dismissed from the clinic at the provider's discretion.      For prescription refill requests, have your pharmacy contact our office and allow 72 hours for refills to be completed.    Today you received the following chemotherapy and/or immunotherapy agents: Alimta      To help prevent nausea and vomiting after your treatment, we encourage you to take your nausea medication as directed.  BELOW ARE SYMPTOMS THAT SHOULD BE REPORTED IMMEDIATELY: *FEVER GREATER THAN 100.4 F (38 C) OR HIGHER *CHILLS OR SWEATING *NAUSEA AND VOMITING THAT IS NOT CONTROLLED WITH YOUR NAUSEA MEDICATION *UNUSUAL SHORTNESS OF BREATH *UNUSUAL BRUISING OR BLEEDING *URINARY PROBLEMS (pain or burning when urinating, or frequent urination) *BOWEL PROBLEMS (unusual diarrhea, constipation, pain near the anus) TENDERNESS IN MOUTH AND THROAT WITH OR WITHOUT PRESENCE OF ULCERS (sore throat, sores in mouth, or a toothache) UNUSUAL RASH, SWELLING OR PAIN  UNUSUAL VAGINAL DISCHARGE OR ITCHING   Items with * indicate a potential emergency and should be followed up as soon as possible or go to the Emergency Department if any problems should occur.  Please show the CHEMOTHERAPY ALERT CARD or IMMUNOTHERAPY ALERT CARD at check-in to the  Emergency Department and triage nurse.  Should you have questions after your visit or need to cancel or reschedule your appointment, please contact [facility]  Dept: [phone]  and follow the prompts.  Office hours are 8:00 a.m. to 4:30 p.m. Monday - Friday. Please note that voicemails left after 4:00 p.m. may not be returned until the following business day.  We are closed weekends and major holidays. You have access to a nurse at all times for urgent questions. Please call the main number to the clinic Dept: [phone] and follow the prompts.   For any non-urgent questions, you may also contact your provider using MyChart. We now offer e-Visits for anyone 80 and older to request care online for non-urgent symptoms. For details visit [email or web address].   Also download the MyChart app! Go to the app store, search "MyChart", open the app, select [facility], and log in with your MyChart username and password.  Masks are optional in the cancer centers. If you would like for your care team to wear a mask while they are taking care of you, please let them know. You may have one support person who is at least 71 years old accompany you for your appointments.

## 2022-06-21 ENCOUNTER — Telehealth: Payer: Self-pay

## 2022-06-21 NOTE — Telephone Encounter (Signed)
Allison [name], counseling intern, called patient to schedule a counseling session.   The patient scheduled a virtual session for 1/8 at [place],  Counseling Intern  [phone] [email or web address]

## 2022-06-26 ENCOUNTER — Telehealth: Payer: Self-pay

## 2022-06-26 ENCOUNTER — Encounter: Payer: Self-pay | Admitting: Pulmonary Disease

## 2022-06-26 ENCOUNTER — Other Ambulatory Visit: Payer: Medicare HMO

## 2022-06-26 DIAGNOSIS — C3491 Malignant neoplasm of unspecified part of right bronchus or lung: Secondary | ICD-10-CM

## 2022-06-26 DIAGNOSIS — R0602 Shortness of breath: Secondary | ICD-10-CM

## 2022-06-26 DIAGNOSIS — J479 Bronchiectasis, uncomplicated: Secondary | ICD-10-CM

## 2022-06-26 DIAGNOSIS — Z515 Encounter for palliative care: Secondary | ICD-10-CM

## 2022-06-26 NOTE — Telephone Encounter (Signed)
(  2:57 pm) PC SW completed a call to patient and left a message for patient requesting a call back.

## 2022-06-26 NOTE — Progress Notes (Signed)
Allison [name], counseling intern, met with the patient for their scheduled counseling session.   The patient reported they have recently accepted their anxiety. The patient shared they have learned to not resist anxiety. The patient shared when they begin to become anxious they lay down and think of moments that brought them joy. The patient shared when they practice this, their anxiety moves through them much quicker than it had before.   The patient shared when they begin to feel exhausted, instead of fighting the feeling, they lay down for a rest and wake up renewed.   The patient shared they are no longer labeling their treatment as "good" or "bad."   The patient scheduled their next counseling session for Monday, January 22nd at [place],  Counseling Intern  [phone] [email or web address]

## 2022-06-26 NOTE — Telephone Encounter (Signed)
Mychart message sent by pt: Allison [name] [facility] (supporting Octavio Graves Icard, DO)2 minutes ago (10:14 AM)    I am experiencing shortness of breath when talking and moving around, no pain and am interested in pulmonary rehab to strengthen my lungs. I am not on oxygen.  Will you please let me know how to start this process to improve my breathing?    Thanks for your help, Allison [name]     Dr. Valeta Harms, please advise.

## 2022-06-26 NOTE — Progress Notes (Signed)
COMMUNITY PALLIATIVE CARE SW NOTE  PATIENT NAME: Allison [name] DOB: [birth year] MRN: [id number]  PRIMARY CARE PROVIDER: [name], MD  RESPONSIBLE PARTY:  Acct ID - Guarantor Home Phone Work Phone Relationship Acct Type  [id number] - Almanza,PAU* [phone]  Self P/F     [address] [place], [place], [place] [address]   Social Work Telephonic Encounter (2:57 pm- 3: 18 pm)  PC SW completed a telephonic visit with patient to assess her needs, comfort and status. Patient report that she reached out to her pulmonologist today  regarding pulmonary rehabilitation. She reported that she his having increased shortness of breath when she is talking and moving around. She states that she looses her breath. She report that she continues to participate in counseling 1x week to work on anxiety. She feels that the counseling is effective and has noted some improvement. Her episodes have decreased in frequency and the length has declined also. She will receive her next chemo-infused treatment on Thursday. She report her appetite is descent. She still has foods that don't taste good, but she has found other foods that she is eats well. Patient report that she is maintaining  at a 120 lbs. She denies any pain issues.  Patient report that overall she is slower, weaker, having increased fatigue and shortness or breath.  She report that her BP has been elevated, which causes her anxiety as well. She medication changes to address her elevated BP. She is wearing her compression socks to manage her edema, which she reports has improved overall.  Patient reported that she was stable overall and thanked SW for following up with her. She requested follow-up in 3/4 weeks.       Social History   Tobacco Use   Smoking status: Never   Smokeless tobacco: Never  Substance Use Topics   Alcohol use: Not Currently    Comment: up to 3 drinks per week    CODE STATUS: DNR ADVANCED DIRECTIVES: Yes MOST FORM  COMPLETE:  Yes HOSPICE EDUCATION PROVIDED: No  Khaniyah Bezek, LCSW

## 2022-06-28 ENCOUNTER — Telehealth: Payer: Self-pay | Admitting: Internal Medicine

## 2022-06-28 NOTE — Telephone Encounter (Signed)
Called patient regarding upcoming January and February appointments, patient is notified.

## 2022-06-29 ENCOUNTER — Inpatient Hospital Stay: Payer: Medicare HMO | Attending: Internal Medicine

## 2022-06-29 ENCOUNTER — Inpatient Hospital Stay: Payer: Medicare HMO | Admitting: Nurse Practitioner

## 2022-06-29 ENCOUNTER — Inpatient Hospital Stay (HOSPITAL_BASED_OUTPATIENT_CLINIC_OR_DEPARTMENT_OTHER): Payer: Medicare HMO | Admitting: Internal Medicine

## 2022-06-29 ENCOUNTER — Inpatient Hospital Stay: Payer: Medicare HMO

## 2022-06-29 ENCOUNTER — Encounter: Payer: Self-pay | Admitting: Internal Medicine

## 2022-06-29 VITALS — BP 110/71 | HR 72 | Temp 98.6°F | Resp 16

## 2022-06-29 DIAGNOSIS — I10 Essential (primary) hypertension: Secondary | ICD-10-CM | POA: Diagnosis not present

## 2022-06-29 DIAGNOSIS — Z5111 Encounter for antineoplastic chemotherapy: Secondary | ICD-10-CM | POA: Diagnosis present

## 2022-06-29 DIAGNOSIS — C3491 Malignant neoplasm of unspecified part of right bronchus or lung: Secondary | ICD-10-CM

## 2022-06-29 DIAGNOSIS — Z90710 Acquired absence of both cervix and uterus: Secondary | ICD-10-CM | POA: Diagnosis not present

## 2022-06-29 DIAGNOSIS — Z923 Personal history of irradiation: Secondary | ICD-10-CM | POA: Diagnosis not present

## 2022-06-29 DIAGNOSIS — Z7952 Long term (current) use of systemic steroids: Secondary | ICD-10-CM | POA: Insufficient documentation

## 2022-06-29 DIAGNOSIS — Z95828 Presence of other vascular implants and grafts: Secondary | ICD-10-CM

## 2022-06-29 DIAGNOSIS — C3411 Malignant neoplasm of upper lobe, right bronchus or lung: Secondary | ICD-10-CM | POA: Insufficient documentation

## 2022-06-29 DIAGNOSIS — Z79899 Other long term (current) drug therapy: Secondary | ICD-10-CM | POA: Diagnosis not present

## 2022-06-29 DIAGNOSIS — D649 Anemia, unspecified: Secondary | ICD-10-CM | POA: Diagnosis not present

## 2022-06-29 LAB — CBC WITH DIFFERENTIAL (CANCER CENTER ONLY)
Abs Immature Granulocytes: 0.01 10*3/uL (ref 0.00–0.07)
Basophils Absolute: 0 10*3/uL (ref 0.0–0.1)
Basophils Relative: 0 %
Eosinophils Absolute: 0.1 10*3/uL (ref 0.0–0.5)
Eosinophils Relative: 3 %
HCT: 30.4 % — ABNORMAL LOW (ref 36.0–46.0)
Hemoglobin: 9.7 g/dL — ABNORMAL LOW (ref 12.0–15.0)
Immature Granulocytes: 0 %
Lymphocytes Relative: 21 %
Lymphs Abs: 0.6 10*3/uL — ABNORMAL LOW (ref 0.7–4.0)
MCH: 28.1 pg (ref 26.0–34.0)
MCHC: 31.9 g/dL (ref 30.0–36.0)
MCV: 88.1 fL (ref 80.0–100.0)
Monocytes Absolute: 0.4 10*3/uL (ref 0.1–1.0)
Monocytes Relative: 13 %
Neutro Abs: 1.7 10*3/uL (ref 1.7–7.7)
Neutrophils Relative %: 63 %
Platelet Count: 191 10*3/uL (ref 150–400)
RBC: 3.45 MIL/uL — ABNORMAL LOW (ref 3.87–5.11)
RDW: 14.2 % (ref 11.5–15.5)
WBC Count: 2.8 10*3/uL — ABNORMAL LOW (ref 4.0–10.5)
nRBC: 0 % (ref 0.0–0.2)

## 2022-06-29 LAB — CMP (CANCER CENTER ONLY)
ALT: 36 U/L (ref 0–44)
AST: 34 U/L (ref 15–41)
Albumin: 3.7 g/dL (ref 3.5–5.0)
Alkaline Phosphatase: 59 U/L (ref 38–126)
Anion gap: 5 (ref 5–15)
BUN: 18 mg/dL (ref 8–23)
CO2: 28 mmol/L (ref 22–32)
Calcium: 9.5 mg/dL (ref 8.9–10.3)
Chloride: 107 mmol/L (ref 98–111)
Creatinine: 1.12 mg/dL — ABNORMAL HIGH (ref 0.44–1.00)
GFR, Estimated: 53 mL/min — ABNORMAL LOW (ref >60)
Glucose, Bld: 98 mg/dL (ref 70–99)
Potassium: 3.8 mmol/L (ref 3.5–5.1)
Sodium: 140 mmol/L (ref 135–145)
Total Bilirubin: 0.3 mg/dL (ref 0.3–1.2)
Total Protein: 6.6 g/dL (ref 6.5–8.1)

## 2022-06-29 MED ORDER — SODIUM CHLORIDE 0.9% FLUSH
10.0000 mL | INTRAVENOUS | Status: DC | PRN
  Administered 2022-06-29: 10 mL

## 2022-06-29 MED ORDER — SODIUM CHLORIDE 0.9 % IV SOLN: Freq: Once | INTRAVENOUS | Status: AC

## 2022-06-29 MED ORDER — SODIUM CHLORIDE 0.9% FLUSH
10.0000 mL | Freq: Once | INTRAVENOUS | Status: AC
  Administered 2022-06-29: 10 mL

## 2022-06-29 MED ORDER — SODIUM CHLORIDE 0.9 % IV SOLN
400.0000 mg/m2 | Freq: Once | INTRAVENOUS | Status: AC
  Administered 2022-06-29: 600 mg via INTRAVENOUS
  Filled 2022-06-29: qty 20

## 2022-06-29 MED ORDER — HEPARIN SOD (PORK) LOCK FLUSH 100 UNIT/ML IV SOLN
500.0000 [IU] | Freq: Once | INTRAVENOUS | Status: AC | PRN
  Administered 2022-06-29: 500 [IU]

## 2022-06-29 MED ORDER — ONDANSETRON HCL 4 MG/2ML IJ SOLN
8.0000 mg | Freq: Once | INTRAMUSCULAR | Status: AC
  Administered 2022-06-29: 8 mg via INTRAVENOUS
  Filled 2022-06-29: qty 4

## 2022-06-29 NOTE — Progress Notes (Deleted)
Palliative Medicine [facility]  Telephone:(336) [phone] Fax:(336) [phone]   Name: Allison [name] Date: 06/29/2022 MRN: [id number]  DOB: 14-Apr-1951  Patient Care Team: [name], MD as PCP - General (Internal Medicine) Little Ishikawa, MD as PCP - Cardiology (Cardiology) Antony Blackbird, MD as Consulting Physician (Radiation Oncology) Pickenpack-Cousar, Arty Baumgartner, NP as Nurse Practitioner (Nurse Practitioner) Syliva Overman, RN as Oncology Nurse Navigator (Oncology) Si Gaul, MD as Consulting Physician (Oncology) Kermit Balo, DO (Geriatric Medicine)   INTERVAL HISTORY: Allison [name] is a 72 y.o. female with stage IV non-small cell lung cancer (12/2017), hypertension, CVA, CAD, DVT/PE (on Lovenox), and GERD.  Palliative ask to see for symptom management and goals of care.  SOCIAL HISTORY:     reports that she has never smoked. She has never used smokeless tobacco. She reports that she does not currently use alcohol. She reports that she does not use drugs.  ADVANCE DIRECTIVES:  None on file   CODE STATUS: DNR  PAST MEDICAL HISTORY: Past Medical History:  Diagnosis Date   Anemia    Anxiety    Arthritis    Asthma    exercise induced   Depression    PMH   Dyspnea    GERD (gastroesophageal reflux disease)    Glaucoma    History of radiation therapy 01/05/2021   IMRT right lung  11/24/2020-01/05/2021  Dr Antony Blackbird   Hypertension    lung ca dx'd 11/2017   right   Malignant pleural effusion    right   Nuclear sclerotic cataract of right eye 02/28/2021   Dr. Sallye Lat, cataract surgery February 2023   PONV (postoperative nausea and vomiting)    Pre-diabetes    Raynaud's disease    Raynaud's disease    Stroke (HCC) 01/2021   balance off, some express aphasia, weakness    ALLERGIES:  is allergic to hydrocodone-acetaminophen, penicillins, vicodin [hydrocodone-acetaminophen], and other.  MEDICATIONS:  Current  Outpatient Medications  Medication Sig Dispense Refill   acetaminophen (TYLENOL) 500 MG tablet Take 1,000 mg by mouth every 6 (six) hours as needed (pain).     ALPRAZolam (XANAX) 0.25 MG tablet Take 1 tablet (0.25 mg total) by mouth 2 (two) times daily as needed for anxiety. May take 2 tablets (0.5 mg total) at bedtime. 60 tablet 0   amLODipine (NORVASC) 5 MG tablet Take 1.5 tablets (7.5 mg total) by mouth daily.     brompheniramine-pseudoephedrine-DM 30-2-10 MG/5ML syrup Take 5 mLs by mouth 3 (three) times daily as needed. (Patient not taking: Reported on 06/08/2022) 120 mL 0   carvedilol (COREG) 25 MG tablet Take 0.5 tablets (12.5 mg total) by mouth 2 (two) times daily.     cetirizine (ZYRTEC) 5 MG tablet Take 5 mg by mouth daily.     dexamethasone (DECADRON) 4 MG tablet TAKE 1 TABLET TWICE A DAY THE DAY BEFORE, THE DAY OF, AND THE DAY AFTER CHEMOTHERAPY 40 tablet 2   docusate sodium (COLACE) 100 MG capsule Take 1 capsule (100 mg total) by mouth every other day. 15 capsule 5   dorzolamide-timolol (COSOPT) 22.3-6.8 MG/ML ophthalmic solution Place 1 drop into both eyes 2 (two) times daily.     dronabinol (MARINOL) 2.5 MG capsule Take 1 capsule (2.5 mg total) by mouth 2 (two) times daily as needed (decreased appetite). 60 capsule 0   enoxaparin (LOVENOX) 60 MG/0.6ML injection Inject 0.6 mLs (60 mg total) into the skin every 12 (twelve) hours. (Patient taking  differently: Inject 80 mg into the skin daily. 80 mg) 60 mL 1   esomeprazole (NEXIUM) 20 MG capsule Take 1 capsule (20 mg total) by mouth in the morning and at bedtime. 60 capsule 2   ezetimibe (ZETIA) 10 MG tablet Take 10 mg by mouth daily.     FeFum-FePoly-FA-B Cmp-C-Biot (FOLIVANE-PLUS) CAPS TAKE 1 CAPSULE BY MOUTH EVERY DAY IN THE MORNING 90 capsule 0   FLUoxetine (PROZAC) 20 MG capsule Take 1 capsule (20 mg total) by mouth daily. 90 capsule 3   folic acid (FOLVITE) 1 MG tablet Take 1 tablet (1 mg total) by mouth daily. 90 tablet 1    hyoscyamine (LEVSIN SL) 0.125 MG SL tablet Place 2 tablets (0.25 mg total) under the tongue every 6 (six) hours as needed for cramping. Take 1-2 tablets under the tongue every 6 hours as needed for cramping 30 tablet 0   ipratropium-albuterol (DUONEB) 0.5-2.5 (3) MG/3ML SOLN Take 3 mLs by nebulization every 6 (six) hours as needed (Asthma).     isosorbide mononitrate (IMDUR) 30 MG 24 hr tablet TAKE 1 TABLET BY MOUTH EVERY DAY 90 tablet 3   latanoprost (XALATAN) 0.005 % ophthalmic solution Place 1 drop into both eyes at bedtime.      ondansetron (ZOFRAN-ODT) 4 MG disintegrating tablet Take 1 tablet (4 mg total) by mouth every 8 (eight) hours as needed for nausea or vomiting. 30 tablet 1   osimertinib mesylate (TAGRISSO) 80 MG tablet Take 1 tablet (80 mg total) by mouth daily. 30 tablet 4   potassium chloride SA (KLOR-CON M) 20 MEQ tablet Take 1 tablet (20 mEq total) by mouth daily. 7 tablet 0   REPATHA SURECLICK 140 MG/ML SOAJ INJECT 140 MG INTO THE SKIN EVERY 14 (FOURTEEN) DAYS. 6 mL 3   valACYclovir (VALTREX) 500 MG tablet Take 1 tablet twice a day by oral route for 5 days. (Patient not taking: Reported on 06/08/2022)     No current facility-administered medications for this visit.    VITAL SIGNS: LMP  (LMP Unknown)  There were no vitals filed for this visit.   Estimated body mass index is 21.22 kg/m as calculated from the following:   Height as of 04/06/22: 5\' 4"  (1.626 m).   Weight as of 06/08/22: 123 lb 9.6 oz (56.1 kg).   PERFORMANCE STATUS (ECOG) : 1 - Symptomatic but completely ambulatory   IMPRESSION:    Fatigue   Anxiety   3. Nausea    PLAN: Symptoms are well controlled. Appetite is much improved. She is being followed by the dietician.  Nexium twice daily for indigestion. Tolerating well.  Anxiety well controlled. Occasional use of Xanax at bedtime.   Zofran as needed for nausea. I will plan to see back in 6-8 weeks in collaboration with her other oncology  appointments.    Patient expressed understanding and was in agreement with this plan. She also understands that She can call the clinic at any time with any questions, concerns, or complaints.     Time Total: 20 min   Visit consisted of counseling and education dealing with the complex and emotionally intense issues of symptom management and palliative care in the setting of serious and potentially life-threatening illness.Greater than 50%  of this time was spent counseling and coordinating care related to the above assessment and plan.  06/10/22, AGPCNP-BC  Palliative Medicine Team/[facility]

## 2022-06-29 NOTE — Progress Notes (Signed)
[facility] Telephone:(336) [phone]   Fax:(336) [phone]  OFFICE PROGRESS NOTE  [name], [place] [address]  DIAGNOSIS: Stage IV (T2 a,N2, M1a) non-small cell lung cancer, adenocarcinoma diagnosed in July 2019 and presented with right upper lobe lung mass in addition to mediastinal lymphadenopathy as well as bilateral pulmonary nodules and malignant right pleural effusion.   Biomarker Findings Microsatellite status - Cannot Be Determined Tumor Mutational Burden - Cannot Be Determined Genomic Findings For a complete list of the genes assayed, please refer to the Appendix. EGFR exon 19 deletion ([id number]) TP53 Y220C 7 Disease relevant genes with no reportable alterations: KRAS, ALK, BRAF, MET, RET, ERBB2, ROS1    PRIOR THERAPY:  1) Status post right Pleurx catheter placement by Dr. Prescott Gum for drainage of malignant right pleural effusion. 2) palliative radiotherapy to the enlarging right upper lobe lung mass and mediastinum under the care of Dr. Sondra Come expected to be completed on January 05, 2021. 3) Tagrisso 80 mg p.o. daily.  First dose was given on 01/29/2018.  Status post 39 months of treatment.   CURRENT THERAPY: Systemic chemotherapy with carboplatin for AUC of 5 and Alimta 500 Mg/M2 every 3 weeks.  First dose 03/17/2021.  The patient will also continue her current treatment with Tagrisso 80 mg p.o. daily.  She is status post 22 cycles. Starting from cycle #7, she will be on Alimta only 400 mg/m2.  INTERVAL HISTORY: Allison [name] 72 y.o. female returns to the clinic today for follow-up visit accompanied by her boyfriend.  The patient is feeling fine today with no concerning complaints except for the baseline fatigue.  She also has shortness of breath with exertion and she reach out to Dr. Valeta Harms for consideration of pulmonary rehabilitation.  She did not have the appointment yet.  She denied having any current chest  pain, cough or hemoptysis.  She has no nausea, vomiting, diarrhea or constipation.  She has no headache or visual changes.  She continues to tolerate her treatment with Tagrisso and Alimta fairly well.  She is here today for evaluation before starting cycle #23.  MEDICAL HISTORY: Past Medical History:  Diagnosis Date   Anemia    Anxiety    Arthritis    Asthma    exercise induced   Depression    PMH   Dyspnea    GERD (gastroesophageal reflux disease)    Glaucoma    History of radiation therapy 01/05/2021   IMRT right lung  11/24/2020-01/05/2021  Dr [name]   Hypertension    lung ca dx'd 11/2017   right   Malignant pleural effusion    right   Nuclear sclerotic cataract of right eye 02/28/2021   Dr. Warden Fillers, cataract surgery February 2023   PONV (postoperative nausea and vomiting)    Pre-diabetes    Raynaud's disease    Raynaud's disease    Stroke ([facility]) 01/2021   balance off, some express aphasia, weakness    ALLERGIES:  is allergic to hydrocodone-acetaminophen, penicillins, vicodin [hydrocodone-acetaminophen], and other.  MEDICATIONS:  Current Outpatient Medications  Medication Sig Dispense Refill   acetaminophen (TYLENOL) 500 MG tablet Take 1,000 mg by mouth every 6 (six) hours as needed (pain).     ALPRAZolam (XANAX) 0.25 MG tablet Take 1 tablet (0.25 mg total) by mouth 2 (two) times daily as needed for anxiety. May take 2 tablets (0.5 mg total) at bedtime. 60 tablet 0   amLODipine (NORVASC)  5 MG tablet Take 1.5 tablets (7.5 mg total) by mouth daily.     brompheniramine-pseudoephedrine-DM 30-2-10 MG/5ML syrup Take 5 mLs by mouth 3 (three) times daily as needed. (Patient not taking: Reported on 06/08/2022) 120 mL 0   carvedilol (COREG) 25 MG tablet Take 0.5 tablets (12.5 mg total) by mouth 2 (two) times daily.     cetirizine (ZYRTEC) 5 MG tablet Take 5 mg by mouth daily.     dexamethasone (DECADRON) 4 MG tablet TAKE 1 TABLET TWICE A DAY THE DAY BEFORE, THE DAY OF,  AND THE DAY AFTER CHEMOTHERAPY 40 tablet 2   docusate sodium (COLACE) 100 MG capsule Take 1 capsule (100 mg total) by mouth every other day. 15 capsule 5   dorzolamide-timolol (COSOPT) 22.3-6.8 MG/ML ophthalmic solution Place 1 drop into both eyes 2 (two) times daily.     dronabinol (MARINOL) 2.5 MG capsule Take 1 capsule (2.5 mg total) by mouth 2 (two) times daily as needed (decreased appetite). 60 capsule 0   enoxaparin (LOVENOX) 60 MG/0.6ML injection Inject 0.6 mLs (60 mg total) into the skin every 12 (twelve) hours. (Patient taking differently: Inject 80 mg into the skin daily. 80 mg) 60 mL 1   esomeprazole (NEXIUM) 20 MG capsule Take 1 capsule (20 mg total) by mouth in the morning and at bedtime. 60 capsule 2   ezetimibe (ZETIA) 10 MG tablet Take 10 mg by mouth daily.     FeFum-FePoly-FA-B Cmp-C-Biot (FOLIVANE-PLUS) CAPS TAKE 1 CAPSULE BY MOUTH EVERY DAY IN THE MORNING 90 capsule 0   FLUoxetine (PROZAC) 20 MG capsule Take 1 capsule (20 mg total) by mouth daily. 90 capsule 3   folic acid (FOLVITE) 1 MG tablet Take 1 tablet (1 mg total) by mouth daily. 90 tablet 1   hyoscyamine (LEVSIN SL) 0.125 MG SL tablet Place 2 tablets (0.25 mg total) under the tongue every 6 (six) hours as needed for cramping. Take 1-2 tablets under the tongue every 6 hours as needed for cramping 30 tablet 0   ipratropium-albuterol (DUONEB) 0.5-2.5 (3) MG/3ML SOLN Take 3 mLs by nebulization every 6 (six) hours as needed (Asthma).     isosorbide mononitrate (IMDUR) 30 MG 24 hr tablet TAKE 1 TABLET BY MOUTH EVERY DAY 90 tablet 3   latanoprost (XALATAN) 0.005 % ophthalmic solution Place 1 drop into both eyes at bedtime.      ondansetron (ZOFRAN-ODT) 4 MG disintegrating tablet Take 1 tablet (4 mg total) by mouth every 8 (eight) hours as needed for nausea or vomiting. 30 tablet 1   osimertinib mesylate (TAGRISSO) 80 MG tablet Take 1 tablet (80 mg total) by mouth daily. 30 tablet 4   potassium chloride SA (KLOR-CON M) 20 MEQ  tablet Take 1 tablet (20 mEq total) by mouth daily. 7 tablet 0   REPATHA SURECLICK 443 MG/ML SOAJ INJECT 140 MG INTO THE SKIN EVERY 14 (FOURTEEN) DAYS. 6 mL 3   valACYclovir (VALTREX) 500 MG tablet Take 1 tablet twice a day by oral route for 5 days. (Patient not taking: Reported on 06/08/2022)     No current facility-administered medications for this visit.    SURGICAL HISTORY:  Past Surgical History:  Procedure Laterality Date   ABDOMINAL HYSTERECTOMY     partial   BRONCHIAL BIOPSY  04/21/2021   Procedure: BRONCHIAL BIOPSIES;  Surgeon: [name], DO;  Location: [facility];  Service: Pulmonary;;   BRONCHIAL BRUSHINGS  04/21/2021   Procedure: BRONCHIAL BRUSHINGS;  Surgeon: [name], DO;  Location:  [facility];  Service: Pulmonary;;   BRONCHIAL NEEDLE ASPIRATION BIOPSY  04/21/2021   Procedure: BRONCHIAL NEEDLE ASPIRATION BIOPSIES;  Surgeon: [name], DO;  Location: [facility];  Service: Pulmonary;;   CHEST TUBE INSERTION Right 01/01/2018   Procedure: INSERTION PLEURAL DRAINAGE CATHETER;  Surgeon: Ivin Poot, MD;  [facility];  Service: Thoracic;  Laterality: Right;   CHEST TUBE INSERTION  04/21/2021   Procedure: CHEST TUBE INSERTION;  Surgeon: [name], DO;  Location: [facility];  Service: Pulmonary;;   COLONOSCOPY     CORONARY STENT INTERVENTION N/A 06/16/2019   Procedure: CORONARY STENT INTERVENTION;  Surgeon: Jettie Booze, MD;  Location: [facility];  Service: Cardiovascular;  Laterality: N/A;   DILATION AND CURETTAGE OF UTERUS     EYE SURGERY     due to Glaucoma   IR IMAGING GUIDED PORT INSERTION  03/22/2021   IR PORT REPAIR CENTRAL VENOUS ACCESS DEVICE  04/15/2021   LEFT HEART CATH AND CORONARY ANGIOGRAPHY N/A 06/16/2019   Procedure: LEFT HEART CATH AND CORONARY ANGIOGRAPHY;  Surgeon: Jettie Booze, MD;  Location: [facility];  Service: Cardiovascular;  Laterality: N/A;   REMOVAL OF PLEURAL DRAINAGE CATHETER  Right 11/07/2018   Procedure: REMOVAL OF PLEURAL DRAINAGE CATHETER;  Surgeon: Ivin Poot, MD;  Location: [facility];  Service: Thoracic;  Laterality: Right;   ROTATOR CUFF REPAIR     TUBAL LIGATION     VIDEO BRONCHOSCOPY WITH ENDOBRONCHIAL NAVIGATION Bilateral 04/21/2021   Procedure: VIDEO BRONCHOSCOPY WITH ENDOBRONCHIAL NAVIGATION;  Surgeon: [name], DO;  Location: [facility];  Service: Pulmonary;  Laterality: Bilateral;  ION   WISDOM TOOTH EXTRACTION      REVIEW OF SYSTEMS:  A comprehensive review of systems was negative except for: Constitutional: positive for fatigue Respiratory: positive for dyspnea on exertion   PHYSICAL EXAMINATION: General appearance: alert, cooperative, appears stated age, fatigued, and no distress Head: Normocephalic, without obvious abnormality, atraumatic Neck: no adenopathy, no JVD, supple, symmetrical, trachea midline, and thyroid not enlarged, symmetric, no tenderness/mass/nodules Lymph nodes: Cervical, supraclavicular, and axillary nodes normal. Resp: clear to auscultation bilaterally Back: symmetric, no curvature. ROM normal. No CVA tenderness. Cardio: regular rate and rhythm, S1, S2 normal, no murmur, click, rub or gallop GI: soft, non-tender; bowel sounds normal; no masses,  no organomegaly Extremities: extremities normal, atraumatic, no cyanosis or edema  ECOG PERFORMANCE STATUS: 1 - Symptomatic but completely ambulatory  Blood pressure 127/63, pulse 66, temperature 98.5 F (36.9 C), temperature source Oral, resp. rate 15, weight 125 lb 3.2 oz (56.8 kg), SpO2 100 %.  LABORATORY DATA: Lab Results  Component Value Date   WBC 2.8 (L) 06/29/2022   HGB 9.7 (L) 06/29/2022   HCT 30.4 (L) 06/29/2022   MCV 88.1 06/29/2022   PLT 191 06/29/2022      Chemistry      Component Value Date/Time   NA 140 06/08/2022 0822   NA 140 03/04/2021 1516   K 3.4 (L) 06/08/2022 0822   CL 107 06/08/2022 0822   CO2 26 06/08/2022 0822   BUN 23 06/08/2022  0822   BUN 21 03/04/2021 1516   CREATININE 1.23 (H) 06/08/2022 0822      Component Value Date/Time   CALCIUM 9.6 06/08/2022 0822   ALKPHOS 68 06/08/2022 0822   AST 47 (H) 06/08/2022 0822   ALT 45 (H) 06/08/2022 0822   BILITOT 0.2 (L) 06/08/2022 0822       RADIOGRAPHIC STUDIES: CT Chest W  Contrast  Result Date: 06/05/2022 CLINICAL DATA:  Non-small-cell lung cancer. Restaging. * Tracking Code: BO * EXAM: CT CHEST WITH CONTRAST TECHNIQUE: Multidetector CT imaging of the chest was performed during intravenous contrast administration. RADIATION DOSE REDUCTION: This exam was performed according to the departmental dose-optimization program which includes automated exposure control, adjustment of the mA and/or kV according to patient size and/or use of iterative reconstruction technique. CONTRAST:  34mL OMNIPAQUE IOHEXOL 300 MG/ML  SOLN COMPARISON:  04/03/2022 FINDINGS: Cardiovascular: The heart size is normal. No substantial pericardial effusion. Coronary artery calcification is evident. Mild atherosclerotic calcification is noted in the wall of the thoracic aorta. Right Port-A-Cath tip is positioned in the right atrium. Mediastinum/Nodes: No mediastinal lymphadenopathy. There is no hilar lymphadenopathy. The esophagus has normal imaging features. There is no axillary lymphadenopathy. Lungs/Pleura: Post treatment scarring in the parahilar right lung extending into the apex is stable in the interval. Volume loss in the right chest is unchanged. Subtle changes of centrilobular emphysema noted left lung. Anterior left lower lobe calcified granuloma again noted. No new suspicious pulmonary nodule or mass. Stable tiny right pleural effusion. Upper Abdomen: Unremarkable. Musculoskeletal: No worrisome lytic or sclerotic osseous abnormality. IMPRESSION: 1. Stable exam. No new or progressive findings. 2. Stable post treatment scarring in the parahilar right lung extending into the apex. 3. Stable tiny right  pleural effusion. 4. Aortic Atherosclerosis (ICD10-I70.0) and Emphysema (ICD10-J43.9). Electronically Signed   By: Misty Stanley M.D.   On: 06/05/2022 10:57    ASSESSMENT AND PLAN: This is a very pleasant 72 years old never smoker African-American female recently with a stage IV non-small cell lung cancer, adenocarcinoma with positive EGFR mutation with deletion in exon 19. The patient was started on treatment with Tagrisso 80 mg p.o. daily status post 35 months of treatment. She has been tolerating this treatment well with no concerning adverse effects except for intermittent diarrhea. She had repeat CT scan of the chest, abdomen pelvis performed recently.  I personally and independently reviewed the scans and discussed the results with the patient and her boyfriend today. Unfortunately the CT scan showed interval progression of the right apical lung mass in addition to progression of mediastinal lymphadenopathy concerning for worsening of her disease. She has no actionable resistant mutation on the molecular studies performed by [facility]. The patient continued her current treatment with Tagrisso and tolerating it fairly well. 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 Sondra Come completed January 05, 2021. The patient had significant opacities in her lung that was initially thought to be secondary to radiation treatment versus Tagrisso induced pneumonitis versus lymphangitic spread of the tumor.  She was treated with high-dose taper regiment of prednisone Repeat imaging studies after the palliative radiotherapy showed evidence for disease progression. Her molecular studies by [facility] recently showed no new resistant mutation.  After discussion of her treatment options including palliative care and hospice referral versus palliative systemic chemotherapy the patient was interested in proceeding  palliative systemic chemotherapy.  She started  palliative systemic chemotherapy with carboplatin for AUC of 5 and Alimta 500 Mg/M2 every 3 weeks.  Status post 22 cycles.  Starting from cycle #7 the patient is on treatment with single agent Alimta every 3 weeks. I did not add a Avastin to her treatment because of the recent stroke.  She also continued her treatment with Tagrisso at the same time.  The patient has been tolerating  this treatment fairly well with no concerning adverse effects. I recommended for her to continue her current treatment with Alimta and Tagrisso as planned. She will come back for follow-up visit in 3 weeks for evaluation before starting cycle #24. For the anemia, she will continue with the oral iron tablets. The patient was advised to call immediately if she has any other concerning symptoms in the interval.  The patient voices understanding of current disease status and treatment options and is in agreement with the current care plan.  All questions were answered. The patient knows to call the clinic with any problems, questions or concerns. We can certainly see the patient much sooner if necessary.  he total time spent in the appointment was 20 minutes.  Disclaimer: This note was dictated with voice recognition software. Similar sounding words can inadvertently be transcribed and may not be corrected upon review.

## 2022-06-29 NOTE — Addendum Note (Signed)
Addended by: Ardeen Garland on: 06/29/2022 12:13 PM   Modules accepted: Orders

## 2022-06-29 NOTE — Patient Instructions (Signed)
[name]  Discharge Instructions: Thank you for choosing [facility] to provide your oncology and hematology care.   If you have a lab appointment with the [facility], please go directly to the [facility] and check in at the registration area.   Wear comfortable clothing and clothing appropriate for easy access to any Portacath or PICC line.   We strive to give you quality time with your provider. You may need to reschedule your appointment if you arrive late (15 or more minutes).  Arriving late affects you and other patients whose appointments are after yours.  Also, if you miss three or more appointments without notifying the office, you may be dismissed from the clinic at the provider's discretion.      For prescription refill requests, have your pharmacy contact our office and allow 72 hours for refills to be completed.    Today you received the following chemotherapy and/or immunotherapy agent: Pemetrexed (Alimta)   To help prevent nausea and vomiting after your treatment, we encourage you to take your nausea medication as directed.  BELOW ARE SYMPTOMS THAT SHOULD BE REPORTED IMMEDIATELY: *FEVER GREATER THAN 100.4 F (38 C) OR HIGHER *CHILLS OR SWEATING *NAUSEA AND VOMITING THAT IS NOT CONTROLLED WITH YOUR NAUSEA MEDICATION *UNUSUAL SHORTNESS OF BREATH *UNUSUAL BRUISING OR BLEEDING *URINARY PROBLEMS (pain or burning when urinating, or frequent urination) *BOWEL PROBLEMS (unusual diarrhea, constipation, pain near the anus) TENDERNESS IN MOUTH AND THROAT WITH OR WITHOUT PRESENCE OF ULCERS (sore throat, sores in mouth, or a toothache) UNUSUAL RASH, SWELLING OR PAIN  UNUSUAL VAGINAL DISCHARGE OR ITCHING   Items with * indicate a potential emergency and should be followed up as soon as possible or go to the Emergency Department if any problems should occur.  Please show the CHEMOTHERAPY ALERT CARD or IMMUNOTHERAPY ALERT CARD at  check-in to the Emergency Department and triage nurse.  Should you have questions after your visit or need to cancel or reschedule your appointment, please contact [facility]  Dept: [phone]  and follow the prompts.  Office hours are 8:00 a.m. to 4:30 p.m. Monday - Friday. Please note that voicemails left after 4:00 p.m. may not be returned until the following business day.  We are closed weekends and major holidays. You have access to a nurse at all times for urgent questions. Please call the main number to the clinic Dept: [phone] and follow the prompts.   For any non-urgent questions, you may also contact your provider using MyChart. We now offer e-Visits for anyone 74 and older to request care online for non-urgent symptoms. For details visit [email or web address].   Also download the MyChart app! Go to the app store, search "MyChart", open the app, select [facility], and log in with your MyChart username and password. Pemetrexed Injection What is this medication? PEMETREXED (PEM e TREX ed) treats some types of cancer. It works by slowing down the growth of cancer cells. This medicine may be used for other purposes; ask your health care provider or pharmacist if you have questions. COMMON BRAND NAME(S): Alimta, PEMFEXY What should I tell my care team before I take this medication? They need to know if you have any of these conditions: Infection, such as chickenpox, cold sores, or herpes Kidney disease Low blood cell levels (white cells, red cells, and platelets) Lung or breathing disease, such as asthma Radiation therapy An unusual or allergic reaction to pemetrexed, other medications, foods, dyes,  or preservatives If you or your partner are pregnant or trying to get pregnant Breast-feeding How should I use this medication? This medication is injected into a vein. It is given by your care team in a hospital or clinic setting. Talk to your care  team about the use of this medication in children. Special care may be needed. Overdosage: If you think you have taken too much of this medicine contact a poison control center or emergency room at once. NOTE: This medicine is only for you. Do not share this medicine with others. What if I miss a dose? Keep appointments for follow-up doses. It is important not to miss your dose. Call your care team if you are unable to keep an appointment. What may interact with this medication? Do not take this medication with any of the following: Live virus vaccines This medication may also interact with the following: Ibuprofen This list may not describe all possible interactions. Give your health care provider a list of all the medicines, herbs, non-prescription drugs, or dietary supplements you use. Also tell them if you smoke, drink alcohol, or use illegal drugs. Some items may interact with your medicine. What should I watch for while using this medication? Your condition will be monitored carefully while you are receiving this medication. This medication may make you feel generally unwell. This is not uncommon as chemotherapy can affect healthy cells as well as cancer cells. Report any side effects. Continue your course of treatment even though you feel ill unless your care team tells you to stop. This medication can cause serious side effects. To reduce the risk, your care team may give you other medications to take before receiving this one. Be sure to follow the directions from your care team. This medication can cause a rash or redness in areas of the body that have previously had radiation therapy. If you have had radiation therapy, tell your care team if you notice a rash in this area. This medication may increase your risk of getting an infection. Call your care team for advice if you get a fever, chills, sore throat, or other symptoms of a cold or flu. Do not treat yourself. Try to avoid being around  people who are sick. Be careful brushing or flossing your teeth or using a toothpick because you may get an infection or bleed more easily. If you have any dental work done, tell your dentist you are receiving this medication. Avoid taking medications that contain aspirin, acetaminophen, ibuprofen, naproxen, or ketoprofen unless instructed by your care team. These medications may hide a fever. Check with your care team if you have severe diarrhea, nausea, and vomiting, or if you sweat a lot. The loss of too much body fluid may make it dangerous for you to take this medication. Talk to your care team if you or your partner wish to become pregnant or think either of you might be pregnant. This medication can cause serious birth defects if taken during pregnancy and for 6 months after the last dose. A negative pregnancy test is required before starting this medication. A reliable form of contraception is recommended while taking this medication and for 6 months after the last dose. Talk to your care team about reliable forms of contraception. Do not father a child while taking this medication and for 3 months after the last dose. Use a condom while having sex during this time period. Do not breastfeed while taking this medication and for 1 week after the  last dose. This medication may cause infertility. Talk to your care team if you are concerned about your fertility. What side effects may I notice from receiving this medication? Side effects that you should report to your care team as soon as possible: Allergic reactions--skin rash, itching, hives, swelling of the face, lips, tongue, or throat Dry cough, shortness of breath or trouble breathing Infection--fever, chills, cough, sore throat, wounds that don't heal, pain or trouble when passing urine, general feeling of discomfort or being unwell Kidney injury--decrease in the amount of urine, swelling of the ankles, hands, or feet Low red blood cell  level--unusual weakness or fatigue, dizziness, headache, trouble breathing Redness, blistering, peeling, or loosening of the skin, including inside the mouth Unusual bruising or bleeding Side effects that usually do not require medical attention (report to your care team if they continue or are bothersome): Fatigue Loss of appetite Nausea Vomiting This list may not describe all possible side effects. Call your doctor for medical advice about side effects. You may report side effects to FDA at 1-800-FDA-1088. Where should I keep my medication? This medication is given in a hospital or clinic. It will not be stored at home. NOTE: This sheet is a summary. It may not cover all possible information. If you have questions about this medicine, talk to your doctor, pharmacist, or health care provider.  2023 Elsevier/Gold Standard (2021-10-10 00:00:00)

## 2022-07-10 IMAGING — CT CT ABD-PELV W/ CM
2 of 5 series · 16 of 46 positions shown, 18 images · IV contrast (APPLIED)
Comparison: 01/12/2020

CLINICAL DATA: Rectal bleeding and diarrhea for 2 days

EXAM:
CT ABDOMEN AND PELVIS WITH CONTRAST
TECHNIQUE: Multidetector CT imaging of the abdomen and pelvis was performed
using the standard protocol following bolus administration of
intravenous contrast.
CONTRAST:  100mL OMNIPAQUE IOHEXOL 300 MG/ML  SOLN

[Series 2: axial st · axial · 0.79mm/px · z∈[-320,+30]mm · 13 of 82 slices shown, 15 images]
[im 6/82  soft-tissue]
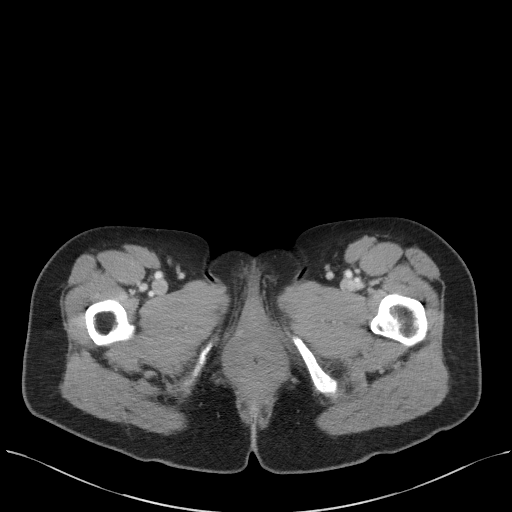
[im 6/82  bone]
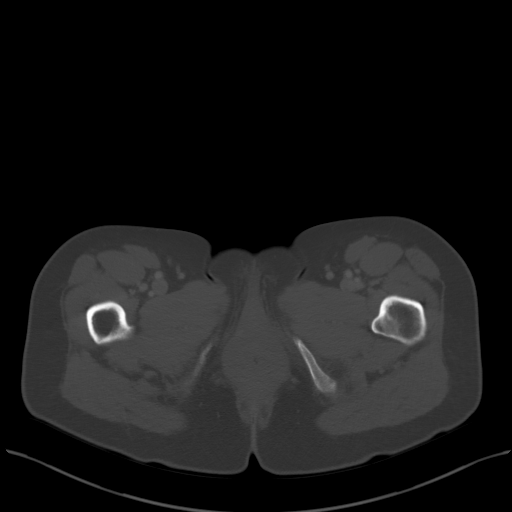
[im 11/82  soft-tissue]
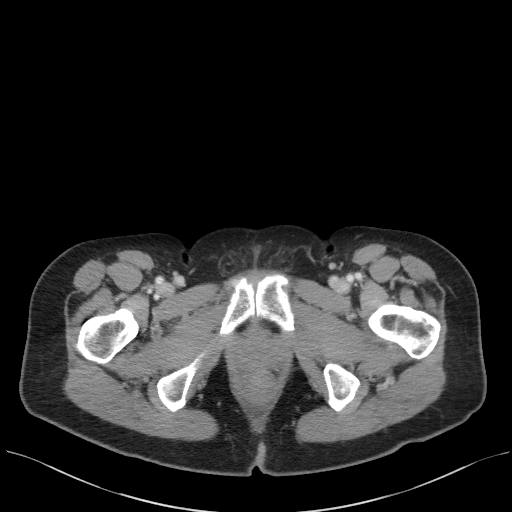
[im 17/82  soft-tissue]
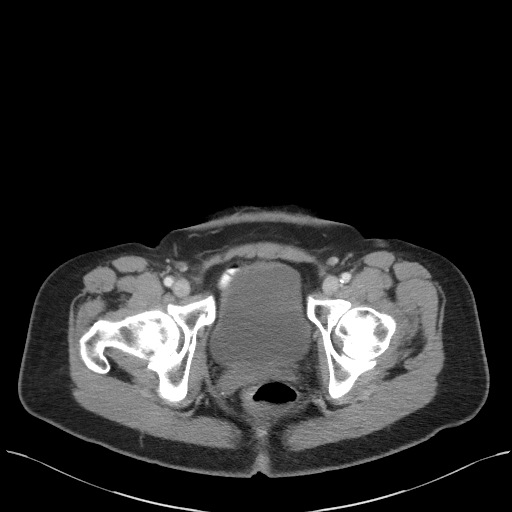
[im 22/82  soft-tissue]
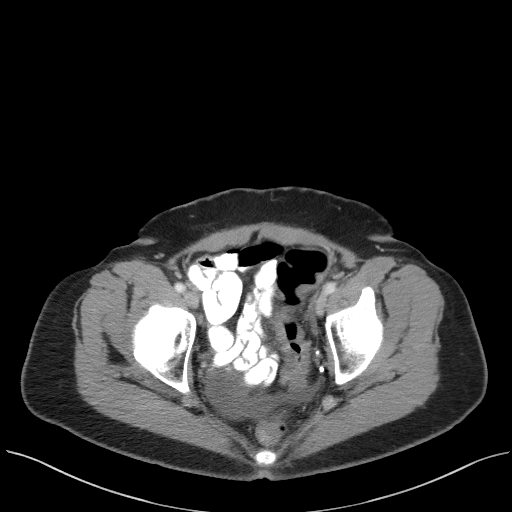
[im 28/82  soft-tissue]
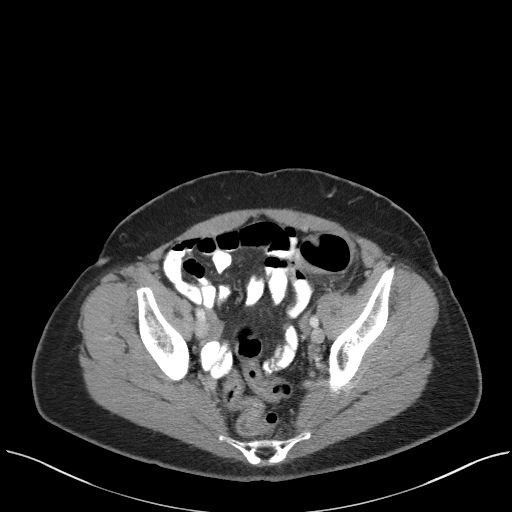
[im 33/82  soft-tissue]
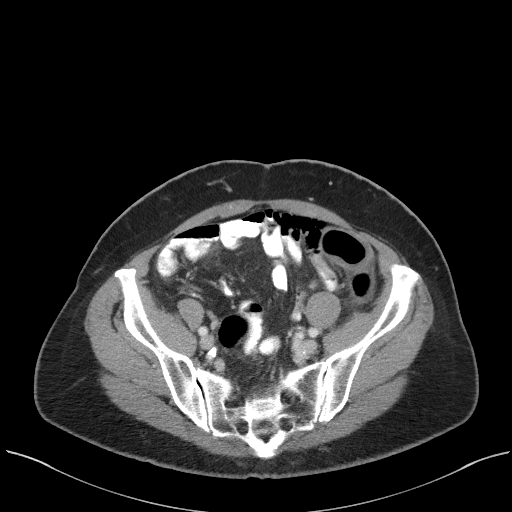
[im 44/82  soft-tissue]
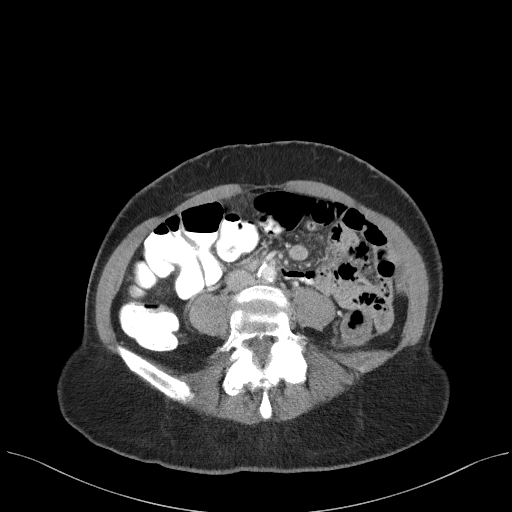
[im 49/82  soft-tissue]
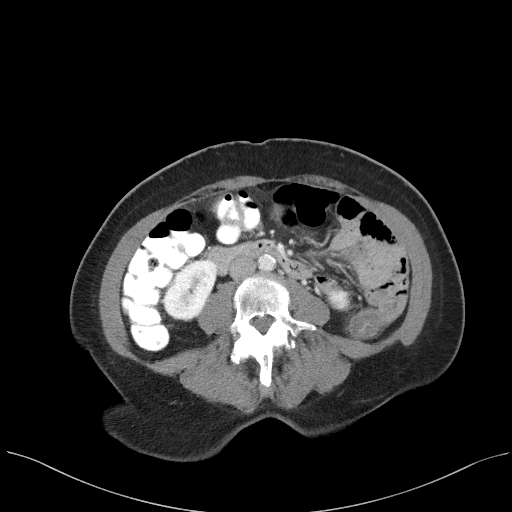
[im 55/82  soft-tissue]
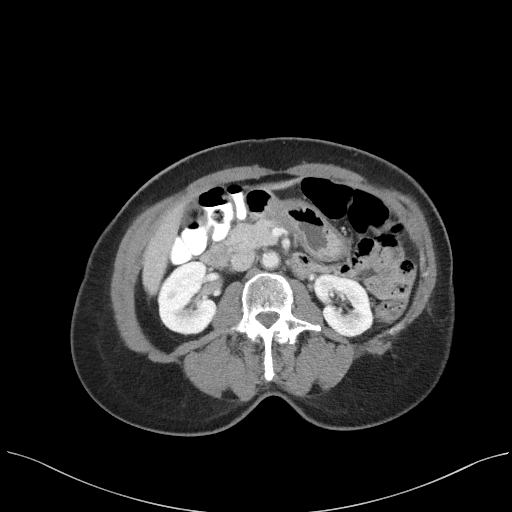
[im 55/82  bone]
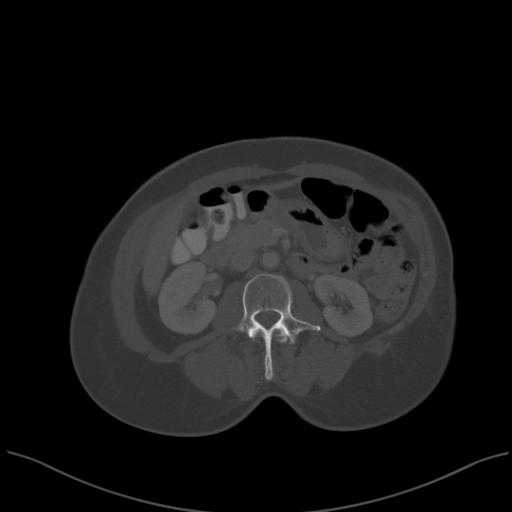
[im 60/82  soft-tissue]
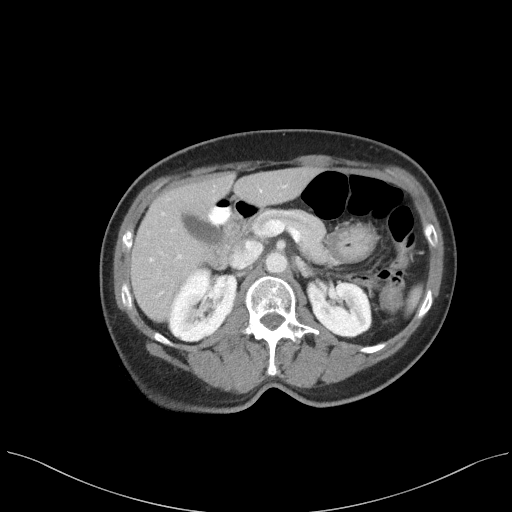
[im 65/82  soft-tissue]
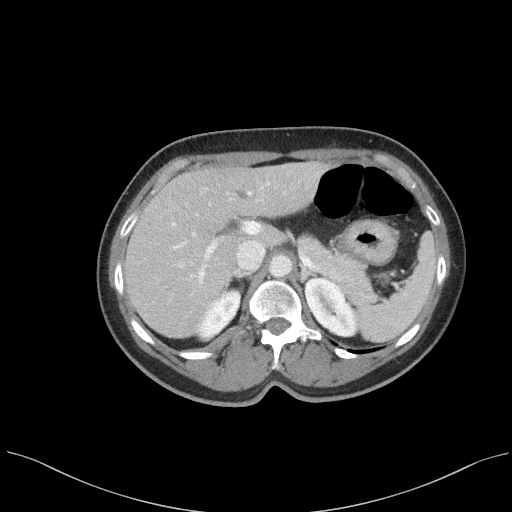
[im 71/82  soft-tissue]
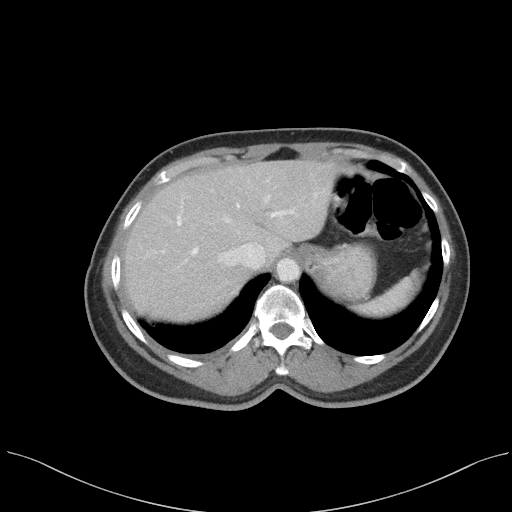
[im 76/82  soft-tissue]
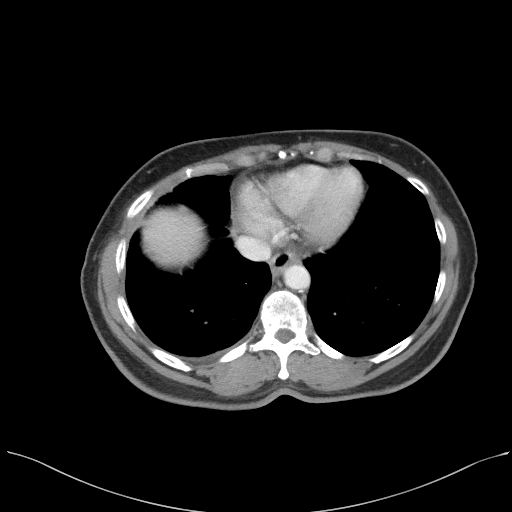

[Series 5: coronal st · coronal · 0.68mm/px · 3 of 89 slices shown]
[im 30/89  soft-tissue]
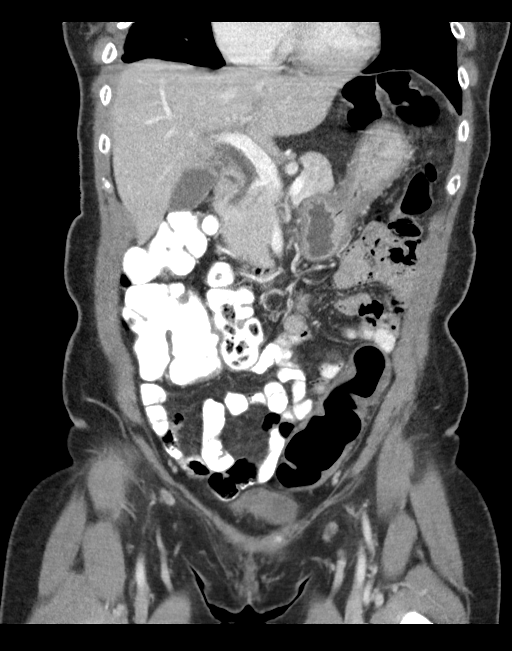
[im 40/89  soft-tissue]
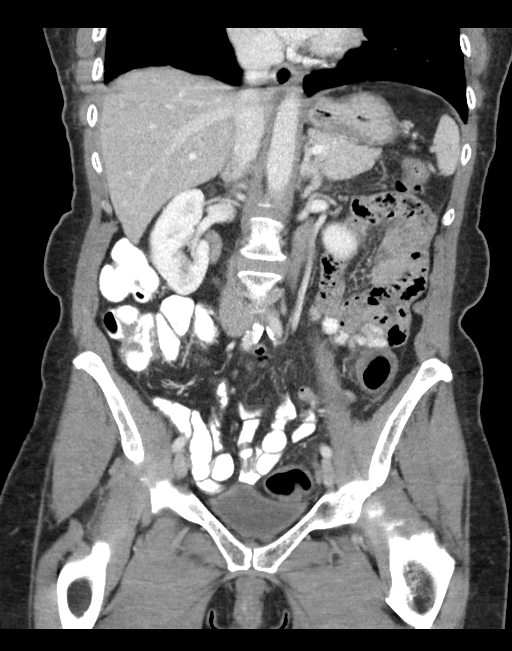
[im 49/89  soft-tissue]
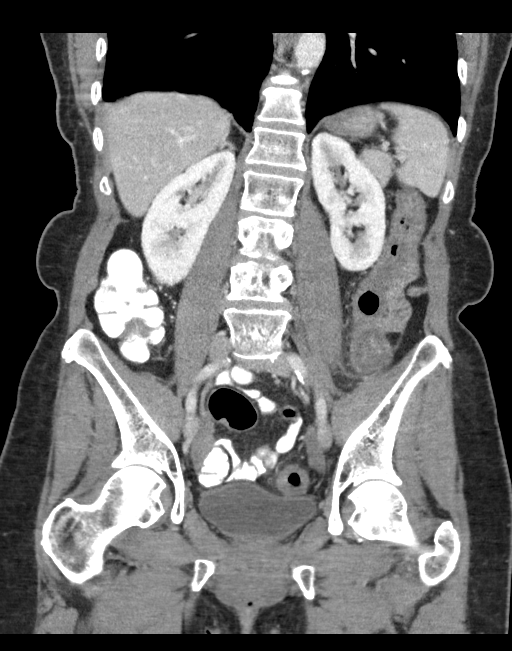

[16 of 46 positions shown; findings below may reference images not displayed]

FINDINGS: Lower chest: Previously seen small right-sided pleural effusion has
resolved in the interval. Stable nodular density is noted in the
right lower lobe anteriorly along the major fissure.

Hepatobiliary: Fatty infiltration of the liver is noted. The
gallbladder is within normal limits.

Pancreas: Unremarkable. No pancreatic ductal dilatation or
surrounding inflammatory changes.

Spleen: Normal in size without focal abnormality.

Adrenals/Urinary Tract: Adrenal glands are within normal limits.
Kidneys demonstrate a normal enhancement pattern. Delayed images
demonstrate normal excretion of contrast material. No obstructive
changes are seen. The bladder is partially distended.

Stomach/Bowel: Colon demonstrates no obstructive changes. Wall
thickening is noted in the distal descending colon and proximal
sigmoid consistent with mild colitis. No appendiceal visualization
is noted. No inflammatory changes to suggest appendicitis are noted.
Small bowel and stomach appear within normal limits.

Vascular/Lymphatic: Aortic atherosclerosis. No enlarged abdominal or
pelvic lymph nodes.

Reproductive: Status post hysterectomy. No adnexal masses.

Other: Mild free fluid is noted within the pelvis likely reactive in
nature.

Musculoskeletal: No acute or significant osseous findings. Stable
anterolisthesis of L4 on L5 is noted.
IMPRESSION: Changes of colitis in the distal descending and proximal sigmoid
colon. No areas of active extravasation are identified.

Stable nodular density in the right lower lobe along the major
fissure.

No other focal abnormality is noted.

## 2022-07-10 NOTE — Progress Notes (Signed)
Rene Paci, counseling intern, met with patient for their scheduled counseling session.   The patient shared they recently ended their long term relationship. The patient reflected they feel at peace.   The patient shared they are feeling less anxiety triggered by exhaustion. The patient reflected, after this last round of chemo when the patient was feeling ill, the patient was able to calm their mind and enjoy watching movies. Something the patient hasn't been able to do prior.   The patient reflected they have come to a place of acceptance that permeates their diagnosis and helps them through other events in their life.   The patient scheduled their next counseling session for 2/15 at 9am.   Rene Paci,  Counseling Intern  [phone] [email or web address]

## 2022-07-11 ENCOUNTER — Encounter (HOSPITAL_COMMUNITY): Payer: Self-pay

## 2022-07-11 ENCOUNTER — Telehealth (HOSPITAL_COMMUNITY): Payer: Self-pay

## 2022-07-11 NOTE — Telephone Encounter (Signed)
Called patient to see if she was interested in participating in the Pulmonary Rehab Program. Patient stated yes. Patient will come in for orientation on 07/12/22@10 :30am and will attend the 1:15pm exercise class.   Sent information via [employer]

## 2022-07-11 NOTE — Telephone Encounter (Signed)
Pt insurance is active and benefits verified through [facility] Co-pay $15, DED 0/0 met, out of pocket $4,500/$20 met, co-insurance 0%. no pre-authorization required, [name]/Griffin 07/11/2022@11 :10am, REF# [id number]

## 2022-07-12 ENCOUNTER — Encounter (HOSPITAL_COMMUNITY)
Admission: RE | Admit: 2022-07-12 | Discharge: 2022-07-12 | Disposition: A | Discharge status: Home / Self Care | Payer: Medicare HMO | Source: Ambulatory Visit | Attending: Pulmonary Disease | Admitting: Pulmonary Disease

## 2022-07-12 ENCOUNTER — Encounter (HOSPITAL_COMMUNITY): Payer: Self-pay

## 2022-07-12 VITALS — BP 130/60 | HR 82 | Ht 64.0 in | Wt 126.3 lb

## 2022-07-12 DIAGNOSIS — J479 Bronchiectasis, uncomplicated: Secondary | ICD-10-CM | POA: Diagnosis present

## 2022-07-12 HISTORY — DX: Atherosclerotic heart disease of native coronary artery without angina pectoris: I25.10

## 2022-07-12 NOTE — Progress Notes (Signed)
Pulmonary Individual Treatment Plan  Patient Details  Name: Allison [name] MRN: [id number] Date of Birth: [birth year] Referring Provider:   April Manson Pulmonary Rehab Walk Test from 07/12/2022 in [facility] [place]  Referring Provider Icard       Initial Encounter Date:  Flowsheet Row Pulmonary Rehab Walk Test from 07/12/2022 in [facility] [place]  Date 07/12/22       Visit Diagnosis: Bronchiectasis without complication (Im[place])  Patient's Home Medications on Admission:   Current Outpatient Medications:    acetaminophen (TYLENOL) 500 MG tablet, Take 1,000 mg by mouth every 6 (six) hours as needed (pain)., Disp: , Rfl:    ALPRAZolam (XANAX) 0.25 MG tablet, Take 1 tablet (0.25 mg total) by mouth 2 (two) times daily as needed for anxiety. May take 2 tablets (0.5 mg total) at bedtime., Disp: 60 tablet, Rfl: 0   amLODipine (NORVASC) 5 MG tablet, Take 1.5 tablets (7.5 mg total) by mouth daily., Disp: , Rfl:    brompheniramine-pseudoephedrine-DM 30-2-10 MG/5ML syrup, Take 5 mLs by mouth 3 (three) times daily as needed., Disp: 120 mL, Rfl: 0   carvedilol (COREG) 25 MG tablet, Take 0.5 tablets (12.5 mg total) by mouth 2 (two) times daily., Disp: , Rfl:    dexamethasone (DECADRON) 4 MG tablet, TAKE 1 TABLET TWICE A DAY THE DAY BEFORE, THE DAY OF, AND THE DAY AFTER CHEMOTHERAPY, Disp: 40 tablet, Rfl: 2   docusate sodium (COLACE) 100 MG capsule, Take 1 capsule (100 mg total) by mouth every other day., Disp: 15 capsule, Rfl: 5   dorzolamide-timolol (COSOPT) 22.3-6.8 MG/ML ophthalmic solution, Place 1 drop into both eyes 2 (two) times daily., Disp: , Rfl:    dronabinol (MARINOL) 2.5 MG capsule, Take 1 capsule (2.5 mg total) by mouth 2 (two) times daily as needed (decreased appetite)., Disp: 60 capsule, Rfl: 0   enoxaparin (LOVENOX) 80 MG/0.8ML injection, Inject 80 mg into the skin daily., Disp: ,  Rfl:    ezetimibe (ZETIA) 10 MG tablet, Take 10 mg by mouth daily., Disp: , Rfl:    FeFum-FePoly-FA-B Cmp-C-Biot (FOLIVANE-PLUS) CAPS, TAKE 1 CAPSULE BY MOUTH EVERY DAY IN THE MORNING, Disp: 90 capsule, Rfl: 0   FLUoxetine (PROZAC) 20 MG capsule, Take 1 capsule (20 mg total) by mouth daily., Disp: 90 capsule, Rfl: 3   folic acid (FOLVITE) 1 MG tablet, Take 1 tablet (1 mg total) by mouth daily., Disp: 90 tablet, Rfl: 1   hyoscyamine (LEVSIN SL) 0.125 MG SL tablet, Place 2 tablets (0.25 mg total) under the tongue every 6 (six) hours as needed for cramping. Take 1-2 tablets under the tongue every 6 hours as needed for cramping, Disp: 30 tablet, Rfl: 0   ipratropium-albuterol (DUONEB) 0.5-2.5 (3) MG/3ML SOLN, Take 3 mLs by nebulization every 6 (six) hours as needed (Asthma)., Disp: , Rfl:    isosorbide mononitrate (IMDUR) 30 MG 24 hr tablet, TAKE 1 TABLET BY MOUTH EVERY DAY, Disp: 90 tablet, Rfl: 3   latanoprost (XALATAN) 0.005 % ophthalmic solution, Place 1 drop into both eyes at bedtime. , Disp: , Rfl:    ondansetron (ZOFRAN-ODT) 4 MG disintegrating tablet, Take 1 tablet (4 mg total) by mouth every 8 (eight) hours as needed for nausea or vomiting., Disp: 30 tablet, Rfl: 1   osimertinib mesylate (TAGRISSO) 80 MG tablet, Take 1 tablet (80 mg total) by mouth daily., Disp: 30 tablet, Rfl: 4   potassium chloride SA (  KLOR-CON M) 20 MEQ tablet, Take 1 tablet (20 mEq total) by mouth daily., Disp: 7 tablet, Rfl: 0   REPATHA SURECLICK 734 MG/ML SOAJ, INJECT 140 MG INTO THE SKIN EVERY 14 (FOURTEEN) DAYS., Disp: 6 mL, Rfl: 3   valACYclovir (VALTREX) 500 MG tablet, , Disp: , Rfl:    cetirizine (ZYRTEC) 5 MG tablet, Take 5 mg by mouth daily. (Patient not taking: Reported on 07/12/2022), Disp: , Rfl:    esomeprazole (NEXIUM) 20 MG capsule, Take 1 capsule (20 mg total) by mouth in the morning and at bedtime. (Patient not taking: Reported on 07/12/2022), Disp: 60 capsule, Rfl: 2  Past Medical History: Past Medical  History:  Diagnosis Date   Anemia    Anxiety    Arthritis    Asthma    exercise induced   Coronary artery disease    Depression    PMH   Dyspnea    GERD (gastroesophageal reflux disease)    Glaucoma    History of radiation therapy 01/05/2021   IMRT right lung  11/24/2020-01/05/2021  Dr [name]   Hypertension    lung ca dx'd 11/2017   right   Malignant pleural effusion    right   Nuclear sclerotic cataract of right eye 02/28/2021   Dr. Warden Fillers, cataract surgery February 2023   PONV (postoperative nausea and vomiting)    Pre-diabetes    Raynaud's disease    Raynaud's disease    Stroke ([place]) 01/2021   balance off, some express aphasia, weakness    Tobacco Use: Social History   Tobacco Use  Smoking Status Never  Smokeless Tobacco Never    Labs: Review Flowsheet  More data exists      Latest Ref Rng & Units 09/13/2020 11/01/2020 02/07/2021 02/12/2021 04/21/2021  Labs for ITP Cardiac and Pulmonary Rehab  Cholestrol 0 - 200 mg/dL 157  153  - 175  -  LDL (calc) 0 - 99 mg/dL 79  74  - 91  -  HDL-C >40 mg/dL 64  64  - 62  -  Trlycerides <150 mg/dL 73  77  - 110  -  Hemoglobin A1c 4.8 - 5.6 % - - - 5.7  -  Bicarbonate 20.0 - 28.0 mmol/L - - 23.4  - -  TCO2 22 - 32 mmol/L - - - - 26   Acid-base deficit 0.0 - 2.0 mmol/L - - 0.2  - -  O2 Saturation % - - 76.1  - -    Capillary Blood Glucose: Lab Results  Component Value Date   GLUCAP 94 04/21/2021   GLUCAP 88 11/09/2020   GLUCAP 87 12/26/2017     Pulmonary Assessment Scores:  Pulmonary Assessment Scores     Row Name 07/12/22 1055         ADL UCSD   SOB Score total 45       CAT Score   CAT Score 33       mMRC Score   mMRC Score 3             UCSD: Self-administered rating of dyspnea associated with activities of daily living (ADLs) 6-point scale (0 = "not at all" to 5 = "maximal or unable to do because of breathlessness")  Scoring Scores range from 0 to 120.  Minimally important  difference is 5 units  CAT: CAT can identify the health impairment of COPD patients and is better correlated with disease progression.  CAT has a scoring range of zero to 40. The  CAT score is classified into four groups of low (less than 10), medium (10 - 20), high (21-30) and very high (31-40) based on the impact level of disease on health status. A CAT score over 10 suggests significant symptoms.  A worsening CAT score could be explained by an exacerbation, poor medication adherence, poor inhaler technique, or progression of COPD or comorbid conditions.  CAT MCID is 2 points  mMRC: mMRC (Modified Medical Research Council) Dyspnea Scale is used to assess the degree of baseline functional disability in patients of respiratory disease due to dyspnea. No minimal important difference is established. A decrease in score of 1 point or greater is considered a positive change.   Pulmonary Function Assessment:  Pulmonary Function Assessment - 07/12/22 1133       Breath   Bilateral Breath Sounds Decreased;Rhonchi    Shortness of Breath Yes;Limiting activity             Exercise Target Goals: Exercise Program Goal: Individual exercise prescription set using results from initial 6 min walk test and THRR while considering  patient's activity barriers and safety.   Exercise Prescription Goal: Initial exercise prescription builds to 30-45 minutes a day of aerobic activity, 2-3 days per week.  Home exercise guidelines will be given to patient during program as part of exercise prescription that the participant will acknowledge.  Activity Barriers & Risk Stratification:  Activity Barriers & Cardiac Risk Stratification - 07/12/22 1053       Activity Barriers & Cardiac Risk Stratification   Activity Barriers Muscular Weakness;Deconditioning;Shortness of Breath;Assistive Device;Balance Concerns             6 Minute Walk:  6 Minute Walk     Row Name 07/12/22 1154         6 Minute Walk    Phase Initial     Distance 920 feet     Walk Time 6 minutes     # of Rest Breaks 0     MPH 1.74     METS 2.3     RPE 12     Perceived Dyspnea  0.5     VO2 Peak 8.05     Symptoms No     Resting HR 82 bpm     Resting BP 130/60     Resting Oxygen Saturation  100 %     Exercise Oxygen Saturation  during 6 min walk 95 %     Max Ex. HR 94 bpm     Max Ex. BP 120/58     2 Minute Post BP 112/60       Interval HR   1 Minute HR 94     2 Minute HR 92     3 Minute HR 93     4 Minute HR 93     5 Minute HR 91     6 Minute HR 91     2 Minute Post HR 72     Interval Heart Rate? Yes       Interval Oxygen   Interval Oxygen? Yes     Baseline Oxygen Saturation % 100 %     1 Minute Oxygen Saturation % 95 %     1 Minute Liters of Oxygen 0 L     2 Minute Oxygen Saturation % 95 %     2 Minute Liters of Oxygen 0 L     3 Minute Oxygen Saturation % 97 %     3 Minute Liters of Oxygen 0  L     4 Minute Oxygen Saturation % 97 %     4 Minute Liters of Oxygen 0 L     5 Minute Oxygen Saturation % 96 %     5 Minute Liters of Oxygen 0 L     6 Minute Oxygen Saturation % 98 %     6 Minute Liters of Oxygen 0 L     2 Minute Post Oxygen Saturation % 97 %     2 Minute Post Liters of Oxygen 0 L              Oxygen Initial Assessment:  Oxygen Initial Assessment - 07/12/22 1054       Home Oxygen   Home Oxygen Device None    Sleep Oxygen Prescription None    Home Exercise Oxygen Prescription None    Home Resting Oxygen Prescription None      Initial 6 min Walk   Oxygen Used None      Program Oxygen Prescription   Program Oxygen Prescription None      Intervention   Short Term Goals To learn and understand importance of monitoring SPO2 with pulse oximeter and demonstrate accurate use of the pulse oximeter.;To learn and understand importance of maintaining oxygen saturations>88%;To learn and demonstrate proper pursed lip breathing techniques or other breathing techniques. ;To learn and  demonstrate proper use of respiratory medications    Long  Term Goals Verbalizes importance of monitoring SPO2 with pulse oximeter and return demonstration;Maintenance of O2 saturations>88%;Exhibits proper breathing techniques, such as pursed lip breathing or other method taught during program session;Compliance with respiratory medication;Demonstrates proper use of MDI's             Oxygen Re-Evaluation:   Oxygen Discharge (Final Oxygen Re-Evaluation):   Initial Exercise Prescription:  Initial Exercise Prescription - 07/12/22 1100       Date of Initial Exercise RX and Referring Provider   Date 07/12/22    Referring Provider Icard    Expected Discharge Date 09/14/22      NuStep   Level 1    SPM 60    Minutes 15      Track   Minutes 15    METs 2.3      Prescription Details   Frequency (times per week) 2    Duration Progress to 30 minutes of continuous aerobic without signs/symptoms of physical distress      Intensity   THRR 40-80% of Max Heartrate 60-119    Ratings of Perceived Exertion 11-13    Perceived Dyspnea 0-4      Progression   Progression Continue to progress workloads to maintain intensity without signs/symptoms of physical distress.      Resistance Training   Training Prescription Yes    Weight red bands    Reps 10-15             Perform Capillary Blood Glucose checks as needed.  Exercise Prescription Changes:   Exercise Comments:   Exercise Goals and Review:   Exercise Goals     Row Name 07/12/22 1158             Exercise Goals   Increase Physical Activity Yes       Intervention Provide advice, education, support and counseling about physical activity/exercise needs.;Develop an individualized exercise prescription for aerobic and resistive training based on initial evaluation findings, risk stratification, comorbidities and participant's personal goals.       Expected Outcomes Short Term: Attend rehab on a  regular basis to  increase amount of physical activity.;Long Term: Add in home exercise to make exercise part of routine and to increase amount of physical activity.;Long Term: Exercising regularly at least 3-5 days a week.       Increase Strength and Stamina Yes       Intervention Provide advice, education, support and counseling about physical activity/exercise needs.;Develop an individualized exercise prescription for aerobic and resistive training based on initial evaluation findings, risk stratification, comorbidities and participant's personal goals.       Expected Outcomes Short Term: Increase workloads from initial exercise prescription for resistance, speed, and METs.;Short Term: Perform resistance training exercises routinely during rehab and add in resistance training at home;Long Term: Improve cardiorespiratory fitness, muscular endurance and strength as measured by increased METs and functional capacity (6MWT)       Able to understand and use rate of perceived exertion (RPE) scale Yes       Intervention Provide education and explanation on how to use RPE scale       Expected Outcomes Short Term: Able to use RPE daily in rehab to express subjective intensity level;Long Term:  Able to use RPE to guide intensity level when exercising independently       Able to understand and use Dyspnea scale Yes       Intervention Provide education and explanation on how to use Dyspnea scale       Expected Outcomes Short Term: Able to use Dyspnea scale daily in rehab to express subjective sense of shortness of breath during exertion;Long Term: Able to use Dyspnea scale to guide intensity level when exercising independently       Knowledge and understanding of Target Heart Rate Range (THRR) Yes       Intervention Provide education and explanation of THRR including how the numbers were predicted and where they are located for reference       Expected Outcomes Short Term: Able to state/look up THRR;Long Term: Able to use THRR to  govern intensity when exercising independently;Short Term: Able to use daily as guideline for intensity in rehab       Understanding of Exercise Prescription Yes       Intervention Provide education, explanation, and written materials on patient's individual exercise prescription       Expected Outcomes Short Term: Able to explain program exercise prescription;Long Term: Able to explain home exercise prescription to exercise independently                Exercise Goals Re-Evaluation :   Discharge Exercise Prescription (Final Exercise Prescription Changes):   Nutrition:  Target Goals: Understanding of nutrition guidelines, daily intake of sodium 1500mg , cholesterol 200mg , calories 30% from fat and 7% or less from saturated fats, daily to have 5 or more servings of fruits and vegetables.  Biometrics:    Nutrition Therapy Plan and Nutrition Goals:   Nutrition Assessments:  MEDIFICTS Score Key: ?70 Need to make dietary changes  40-70 Heart Healthy Diet ? 40 Therapeutic Level Cholesterol Diet   Picture Your Plate Scores: <47 Unhealthy dietary pattern with much room for improvement. 41-50 Dietary pattern unlikely to meet recommendations for good health and room for improvement. 51-60 More healthful dietary pattern, with some room for improvement.  >60 Healthy dietary pattern, although there may be some specific behaviors that could be improved.    Nutrition Goals Re-Evaluation:   Nutrition Goals Discharge (Final Nutrition Goals Re-Evaluation):   Psychosocial: Target Goals: Acknowledge presence or absence of significant depression  and/or stress, maximize coping skills, provide positive support system. Participant is able to verbalize types and ability to use techniques and skills needed for reducing stress and depression.  Initial Review & Psychosocial Screening:  Initial Psych Review & Screening - 07/12/22 1048       Initial Review   Current issues with Current  Anxiety/Panic      Family Dynamics   Good Support System? Yes    Comments Patient has a Social worker and feels anxiety has improved      Barriers   Psychosocial barriers to participate in program There are no identifiable barriers or psychosocial needs.      Screening Interventions   Interventions Encouraged to exercise             Quality of Life Scores:  Scores of 19 and below usually indicate a poorer quality of life in these areas.  A difference of  2-3 points is a clinically meaningful difference.  A difference of 2-3 points in the total score of the Quality of Life Index has been associated with significant improvement in overall quality of life, self-image, physical symptoms, and general health in studies assessing change in quality of life.  PHQ-9: Review Flowsheet  More data exists      07/12/2022 02/02/2021 12/29/2019 10/21/2019 09/17/2019  Depression screen PHQ 2/9  Decreased Interest 0 1 1 0 0  Down, Depressed, Hopeless 0 2 1 0 0  PHQ - 2 Score 0 3 2 0 0  Altered sleeping 1 3 3  - 0  Tired, decreased energy 0 3 2 - 0  Change in appetite 0 2 0 - 0  Feeling bad or failure about yourself  1 0 0 - 0  Trouble concentrating 0 1 1 - 0  Moving slowly or fidgety/restless 0 3 0 - 0  Suicidal thoughts 0 0 0 - 0  PHQ-9 Score 2 15 8  - 0  Difficult doing work/chores Not difficult at all - - - -   Interpretation of Total Score  Total Score Depression Severity:  1-4 = Minimal depression, 5-9 = Mild depression, 10-14 = Moderate depression, 15-19 = Moderately severe depression, 20-27 = Severe depression   Psychosocial Evaluation and Intervention:  Psychosocial Evaluation - 07/12/22 1049       Psychosocial Evaluation & Interventions   Interventions Encouraged to exercise with the program and follow exercise prescription    Comments pt denies psychosocial barriers    Expected Outcomes Pt will benefit from PR without any psychosocial barriers or concerns    Continue Psychosocial  Services  No Follow up required             Psychosocial Re-Evaluation:   Psychosocial Discharge (Final Psychosocial Re-Evaluation):   Education: Education Goals: Education classes will be provided on a weekly basis, covering required topics. Participant will state understanding/return demonstration of topics presented.  Learning Barriers/Preferences:  Learning Barriers/Preferences - 07/12/22 1051       Learning Barriers/Preferences   Learning Barriers Sight    Learning Preferences Skilled Demonstration;Pictoral;Written Material             Education Topics: Introduction to Pulmonary Rehab Group instruction provided by PowerPoint, verbal discussion, and written material to support subject matter. Instructor reviews what Pulmonary Rehab is, the purpose of the program, and how patients are referred.     Know Your Numbers Group instruction that is supported by a PowerPoint presentation. Instructor discusses importance of knowing and understanding resting, exercise, and post-exercise oxygen saturation, heart rate, and  blood pressure. Oxygen saturation, heart rate, blood pressure, rating of perceived exertion, and dyspnea are reviewed along with a normal range for these values.    Exercise for the Pulmonary Patient Group instruction that is supported by a PowerPoint presentation. Instructor discusses benefits of exercise, core components of exercise, frequency, duration, and intensity of an exercise routine, importance of utilizing pulse oximetry during exercise, safety while exercising, and options of places to exercise outside of rehab.       MET Level  Group instruction provided by PowerPoint, verbal discussion, and written material to support subject matter. Instructor reviews what METs are and how to increase METs.    Pulmonary Medications Verbally interactive group education provided by instructor with focus on inhaled medications and proper  administration.   Anatomy and Physiology of the Respiratory System Group instruction provided by PowerPoint, verbal discussion, and written material to support subject matter. Instructor reviews respiratory cycle and anatomical components of the respiratory system and their functions. Instructor also reviews differences in obstructive and restrictive respiratory diseases with examples of each.    Oxygen Safety Group instruction provided by PowerPoint, verbal discussion, and written material to support subject matter. There is an overview of "What is Oxygen" and "Why do we need it".  Instructor also reviews how to create a safe environment for oxygen use, the importance of using oxygen as prescribed, and the risks of noncompliance. There is a brief discussion on traveling with oxygen and resources the patient may utilize.   Oxygen Use Group instruction provided by PowerPoint, verbal discussion, and written material to discuss how supplemental oxygen is prescribed and different types of oxygen supply systems. Resources for more information are provided.    Breathing Techniques Group instruction that is supported by demonstration and informational handouts. Instructor discusses the benefits of pursed lip and diaphragmatic breathing and detailed demonstration on how to perform both.     Risk Factor Reduction Group instruction that is supported by a PowerPoint presentation. Instructor discusses the definition of a risk factor, different risk factors for pulmonary disease, and how the heart and lungs work together.   MD Day A group question and answer session with a medical doctor that allows participants to ask questions that relate to their pulmonary disease state.   Nutrition for the Pulmonary Patient Group instruction provided by PowerPoint slides, verbal discussion, and written materials to support subject matter. The instructor gives an explanation and review of healthy diet  recommendations, which includes a discussion on weight management, recommendations for fruit and vegetable consumption, as well as protein, fluid, caffeine, fiber, sodium, sugar, and alcohol. Tips for eating when patients are short of breath are discussed.    Other Education Group or individual verbal, written, or video instructions that support the educational goals of the pulmonary rehab program.    Knowledge Questionnaire Score:   Core Components/Risk Factors/Patient Goals at Admission:  Personal Goals and Risk Factors at Admission - 07/12/22 1051       Core Components/Risk Factors/Patient Goals on Admission    Weight Management Yes;Weight Gain    Intervention Weight Management: Develop a combined nutrition and exercise program designed to reach desired caloric intake, while maintaining appropriate intake of nutrient and fiber, sodium and fats, and appropriate energy expenditure required for the weight goal.;Weight Management: Provide education and appropriate resources to help participant work on and attain dietary goals.    Expected Outcomes Short Term: Continue to assess and modify interventions until short term weight is achieved;Long Term: Adherence to nutrition  and physical activity/exercise program aimed toward attainment of established weight goal;Weight Maintenance: Understanding of the daily nutrition guidelines, which includes 25-35% calories from fat, 7% or less cal from saturated fats, less than 200mg  cholesterol, less than 1.5gm of sodium, & 5 or more servings of fruits and vegetables daily;Understanding recommendations for meals to include 15-35% energy as protein, 25-35% energy from fat, 35-60% energy from carbohydrates, less than 200mg  of dietary cholesterol, 20-35 gm of total fiber daily;Understanding of distribution of calorie intake throughout the day with the consumption of 4-5 meals/snacks;Weight Gain: Understanding of general recommendations for a high calorie, high protein  meal plan that promotes weight gain by distributing calorie intake throughout the day with the consumption for 4-5 meals, snacks, and/or supplements    Improve shortness of breath with ADL's Yes    Intervention Provide education, individualized exercise plan and daily activity instruction to help decrease symptoms of SOB with activities of daily living.    Expected Outcomes Short Term: Improve cardiorespiratory fitness to achieve a reduction of symptoms when performing ADLs;Long Term: Be able to perform more ADLs without symptoms or delay the onset of symptoms    Increase knowledge of respiratory medications and ability to use respiratory devices properly  Yes    Intervention Provide education and demonstration as needed of appropriate use of medications, inhalers, and oxygen therapy.    Expected Outcomes Short Term: Achieves understanding of medications use. Understands that oxygen is a medication prescribed by physician. Demonstrates appropriate use of inhaler and oxygen therapy.;Long Term: Maintain appropriate use of medications, inhalers, and oxygen therapy.             Core Components/Risk Factors/Patient Goals Review:    Core Components/Risk Factors/Patient Goals at Discharge (Final Review):    ITP Comments:   Comments: Dr Rodman Pickle is the medical director for pulmonary rehab

## 2022-07-12 NOTE — Progress Notes (Signed)
Pulmonary Rehab Orientation Physical Assessment Note  Physical assessment reveals patient is alert and oriented x 4.  Heart rate is normal, breath sounds diminished to auscultation with mild rhonchi heard RLL. Reports non-productive cough. Bowel sounds present.  Pt endorses nausea and diarrhea d/t her chemotherapy. Grip strength equal, strong. Distal pulses +2; mild swelling to lower extremities, pt wearing TED hose.   Janine Ores, RN, BSN

## 2022-07-12 NOTE — Progress Notes (Signed)
Allison [name] 72 y.o. female Pulmonary Rehab Orientation Note This patient who was referred to Pulmonary Rehab by Dr. Valeta Harms with the diagnosis of bronchiectasis arrived today in Cardiac and Pulmonary Rehab. She  arrived ambulatory with assistive device with normal gait. She  does not carry portable oxygen.Per patient, Allison [name] uses oxygen never. Color good, skin warm and dry. Patient is oriented to time and place. Patient's medical history, psychosocial health, and medications reviewed. Psychosocial assessment reveals patient lives with alone. Allison [name] is currently retired. Patient hobbies include spending time with others and gardening. Patient reports her stress level is low. Areas of stress/anxiety include health. Patient does not exhibit signs of depression. PHQ2/9 score 0/2. Allison [name] shows good  coping skills with positive outlook on life. Offered emotional support and reassurance. Will continue to monitor. Physical assessment performed by Nurse pick: Allison Ores RN. Please see their orientation physical assessment note. Allison [name] reports she does take medications as prescribed. Patient states she follows a regular  diet. The patient has been trying to gain weight by eating more.. Patient's weight will be monitored closely. Demonstration and practice of PLB using pulse oximeter. Allison [name] able to return demonstration satisfactorily. Safety and hand hygiene in the exercise area reviewed with patient. Allison [name] voices understanding of the information reviewed. Department expectations discussed with patient and achievable goals were set. The patient shows enthusiasm about attending the program and we look forward to working with Allison [name]. Allison [name] completed a 6 min walk test today and is scheduled to begin exercise on 1/30.   [id number] Allison [name], BS, RRT

## 2022-07-18 ENCOUNTER — Encounter (HOSPITAL_COMMUNITY)
Admission: RE | Admit: 2022-07-18 | Discharge: 2022-07-18 | Disposition: A | Discharge status: Home / Self Care | Payer: Medicare HMO | Source: Ambulatory Visit | Attending: Pulmonary Disease | Admitting: Pulmonary Disease

## 2022-07-18 VITALS — Wt 124.3 lb

## 2022-07-18 DIAGNOSIS — J479 Bronchiectasis, uncomplicated: Secondary | ICD-10-CM | POA: Diagnosis not present

## 2022-07-18 NOTE — Progress Notes (Signed)
Daily Session Note  Patient Details  Name: Allison [name] MRN: [id number] Date of Birth: [birth year] Referring Provider:   April Manson Pulmonary Rehab Walk Test from 07/12/2022 in [facility]  Referring Provider Icard       Encounter Date: 07/18/2022  Check In:  Session Check In - 07/18/22 1440       Check-In   Supervising physician immediately available to respond to emergencies [facility] MD immediately available    Physician(s) Dr Vaughan Browner    Location [facility]    Staff Present Janine Ores, RN, Quentin Ore, MS, ACSM-CEP, Exercise Physiologist;Kynnadi Dicenso Olen Cordial BS, ACSM-CEP, Exercise Physiologist;Samantha [place], RD, LDN;Other    Virtual Visit No    Medication changes reported     No    Fall or balance concerns reported    No    Tobacco Cessation No Change    Current number of cigarettes/nicotine per day     0    Warm-up and Cool-down Performed as group-led instruction    Resistance Training Performed Yes    VAD Patient? No    PAD/SET Patient? No      Pain Assessment   Currently in Pain? No/denies    Pain Score 0-No pain    Multiple Pain Sites No             Capillary Blood Glucose: No results found. However, due to the size of the patient record, not all encounters were searched. Please check Results Review for a complete set of results.   Exercise Prescription Changes - 07/18/22 1500       Response to Exercise   Blood Pressure (Admit) 150/76    Blood Pressure (Exercise) 138/72    Blood Pressure (Exit) 128/70    Heart Rate (Admit) 79 bpm    Heart Rate (Exercise) 83 bpm    Heart Rate (Exit) 87 bpm    Oxygen Saturation (Admit) 100 %    Oxygen Saturation (Exercise) 100 %    Oxygen Saturation (Exit) 97 %    Rating of Perceived Exertion (Exercise) 14    Perceived Dyspnea (Exercise) 2    Duration Continue with 30 min of aerobic exercise without signs/symptoms of physical distress.     Intensity THRR unchanged      Progression   Progression Continue to progress workloads to maintain intensity without signs/symptoms of physical distress.      Resistance Training   Training Prescription Yes    Weight red bands    Reps 10-15    Time 10 Minutes      NuStep   Level 1    SPM 60    Minutes 15    METs 1.2      Track   Laps 7    Minutes 15    METs 2.62             Social History   Tobacco Use  Smoking Status Never  Smokeless Tobacco Never    Goals Met:  Exercise tolerated well No report of concerns or symptoms today Strength training completed today  Goals Unmet:  Not Applicable  Comments: Tolerated first day of exercise well. Service time is from 1326 to 1451.    Dr. Rodman Pickle is Medical Director for Pulmonary Rehab at [facility].

## 2022-07-18 NOTE — Progress Notes (Signed)
[facility] OFFICE PROGRESS NOTE  [name], [place] [address]  DIAGNOSIS: Stage IV (T2 a,N2, M1a) non-small cell lung cancer, adenocarcinoma diagnosed in July 2019 and presented with right upper lobe lung mass in addition to mediastinal lymphadenopathy as well as bilateral pulmonary nodules and malignant right pleural effusion.   Biomarker Findings Microsatellite status - Cannot Be Determined Tumor Mutational Burden - Cannot Be Determined Genomic Findings For a complete list of the genes assayed, please refer to the Appendix. EGFR exon 19 deletion ([id number]) TP53 Y220C 7 Disease relevant genes with no reportable alterations: KRAS, ALK, BRAF, MET, RET, ERBB2, ROS1   PRIOR THERAPY: 1) Status post right Pleurx catheter placement by Dr. Prescott Gum for drainage of malignant right pleural effusion. 2) palliative radiotherapy to the enlarging right upper lobe lung mass and mediastinum under the care of Dr. Sondra Come expected to be completed on January 05, 2021. 3) Tagrisso 80 mg p.o. daily.  First dose was given on 01/29/2018.  Status post 39 months of treatment.  CURRENT THERAPY: Systemic chemotherapy with carboplatin for AUC of 5 and Alimta 500 Mg/M2 every 3 weeks.  First dose 03/17/2021.  The patient will also continue her current treatment with Tagrisso 80 mg p.o. daily.  She is status post 23 cycles. Starting from cycle #7, she will be on Alimta only 500 mg/m2.  Alimta was eventually reduced to 400 mg per metered squared due to renal insufficiency     INTERVAL HISTORY: Allison [name] 72 y.o. female returns to the clinic today for a follow-up visit. The patient is currently undergoing single agent dose reduced Alimta IV every 3 weeks.  The dose of Alimta was reduced due to renal insufficiency.  She feels stable today. She has fatigue, nausea, and occasional reflux a few days following treatment. She is able to manage her symptoms with  pepcid, anti-emetics, and decadron. She uses zofran for her anti-emetic but it is exacerbating her constipation. She is taking colace once a day. She is wanting that removed from her pre-meds today and have alternative on hand at home.   She is expected to start cardiac rehab. She is looking forward to this.    She follows closely with palliative care and overall many of her symptoms have improved.  Her anxiety is controlled. She has been talking to the counselor here at the cancer center about her anxiety which has been helpful for her.    She denies any recent fever, chills, although she is always cold natured. Of note, she has stable anemia. She has stable dyspnea on exertion. Denies significant cough unless she is trying to bring up phlegm. She still sometimes feels tightness in her chest which is chronic and stable. She denies sick contacts or other symptoms of infection like sore throat.  She denies any hemoptysis. She uses Tylenol for chest tightness. She follows with Dr. Valeta Harms from [facility] pulmonary medicine. She also takes levin for abdominal cramping.  She has dry skin on her back but denies rashes. She continues to take her iron supplement.  She denies any headache or visual changes.      She is here today for evaluation before undergoing cycle #24.     MEDICAL HISTORY: Past Medical History:  Diagnosis Date   Anemia    Anxiety    Arthritis    Asthma    exercise induced   Coronary artery disease    Depression    PMH  Dyspnea    GERD (gastroesophageal reflux disease)    Glaucoma    History of radiation therapy 01/05/2021   IMRT right lung  11/24/2020-01/05/2021  Dr [name]   Hypertension    lung ca dx'd 11/2017   right   Malignant pleural effusion    right   Nuclear sclerotic cataract of right eye 02/28/2021   Dr. Warden Fillers, cataract surgery February 2023   PONV (postoperative nausea and vomiting)    Pre-diabetes    Raynaud's disease    Raynaud's disease     Stroke ([place]) 01/2021   balance off, some express aphasia, weakness    ALLERGIES:  is allergic to hydrocodone-acetaminophen, penicillins, lactose, vicodin [hydrocodone-acetaminophen], and other.  MEDICATIONS:  Current Outpatient Medications  Medication Sig Dispense Refill   prochlorperazine (COMPAZINE) 10 MG tablet Take 1 tablet (10 mg total) by mouth every 6 (six) hours as needed. 30 tablet 2   acetaminophen (TYLENOL) 500 MG tablet Take 1,000 mg by mouth every 6 (six) hours as needed (pain).     ALPRAZolam (XANAX) 0.25 MG tablet Take 1 tablet (0.25 mg total) by mouth 2 (two) times daily as needed for anxiety. May take 2 tablets (0.5 mg total) at bedtime. 60 tablet 0   amLODipine (NORVASC) 5 MG tablet Take 1.5 tablets (7.5 mg total) by mouth daily.     brompheniramine-pseudoephedrine-DM 30-2-10 MG/5ML syrup Take 5 mLs by mouth 3 (three) times daily as needed. 120 mL 0   carvedilol (COREG) 25 MG tablet Take 0.5 tablets (12.5 mg total) by mouth 2 (two) times daily.     cetirizine (ZYRTEC) 5 MG tablet Take 5 mg by mouth daily. (Patient not taking: Reported on 07/12/2022)     dexamethasone (DECADRON) 4 MG tablet TAKE 1 TABLET TWICE A DAY THE DAY BEFORE, THE DAY OF, AND THE DAY AFTER CHEMOTHERAPY 40 tablet 2   docusate sodium (COLACE) 100 MG capsule Take 1 capsule (100 mg total) by mouth every other day. 15 capsule 5   dorzolamide-timolol (COSOPT) 22.3-6.8 MG/ML ophthalmic solution Place 1 drop into both eyes 2 (two) times daily.     dronabinol (MARINOL) 2.5 MG capsule Take 1 capsule (2.5 mg total) by mouth 2 (two) times daily as needed (decreased appetite). 60 capsule 0   enoxaparin (LOVENOX) 80 MG/0.8ML injection Inject 80 mg into the skin daily.     esomeprazole (NEXIUM) 20 MG capsule Take 1 capsule (20 mg total) by mouth in the morning and at bedtime. (Patient not taking: Reported on 07/12/2022) 60 capsule 2   ezetimibe (ZETIA) 10 MG tablet Take 10 mg by mouth daily.     FeFum-FePoly-FA-B  Cmp-C-Biot (FOLIVANE-PLUS) CAPS TAKE 1 CAPSULE BY MOUTH EVERY DAY IN THE MORNING 90 capsule 0   FLUoxetine (PROZAC) 20 MG capsule Take 1 capsule (20 mg total) by mouth daily. 90 capsule 3   folic acid (FOLVITE) 1 MG tablet Take 1 tablet (1 mg total) by mouth daily. 90 tablet 1   hyoscyamine (LEVSIN SL) 0.125 MG SL tablet Place 2 tablets (0.25 mg total) under the tongue every 6 (six) hours as needed for cramping. Take 1-2 tablets under the tongue every 6 hours as needed for cramping 30 tablet 0   ipratropium-albuterol (DUONEB) 0.5-2.5 (3) MG/3ML SOLN Take 3 mLs by nebulization every 6 (six) hours as needed (Asthma).     isosorbide mononitrate (IMDUR) 30 MG 24 hr tablet TAKE 1 TABLET BY MOUTH EVERY DAY 90 tablet 3   latanoprost (XALATAN) 0.005 % ophthalmic solution  Place 1 drop into both eyes at bedtime.      ondansetron (ZOFRAN-ODT) 4 MG disintegrating tablet Take 1 tablet (4 mg total) by mouth every 8 (eight) hours as needed for nausea or vomiting. 30 tablet 1   osimertinib mesylate (TAGRISSO) 80 MG tablet Take 1 tablet (80 mg total) by mouth daily. 30 tablet 4   potassium chloride SA (KLOR-CON M) 20 MEQ tablet Take 1 tablet (20 mEq total) by mouth daily. 7 tablet 0   REPATHA SURECLICK 160 MG/ML SOAJ INJECT 140 MG INTO THE SKIN EVERY 14 (FOURTEEN) DAYS. 6 mL 3   valACYclovir (VALTREX) 500 MG tablet      No current facility-administered medications for this visit.    SURGICAL HISTORY:  Past Surgical History:  Procedure Laterality Date   ABDOMINAL HYSTERECTOMY     partial   BRONCHIAL BIOPSY  04/21/2021   Procedure: BRONCHIAL BIOPSIES;  Surgeon: [name], DO;  Location: [facility];  Service: Pulmonary;;   BRONCHIAL BRUSHINGS  04/21/2021   Procedure: BRONCHIAL BRUSHINGS;  Surgeon: [name], DO;  Location: [facility];  Service: Pulmonary;;   BRONCHIAL NEEDLE ASPIRATION BIOPSY  04/21/2021   Procedure: BRONCHIAL NEEDLE ASPIRATION BIOPSIES;  Surgeon: [name], DO;   Location: [facility];  Service: Pulmonary;;   CHEST TUBE INSERTION Right 01/01/2018   Procedure: INSERTION PLEURAL DRAINAGE CATHETER;  Surgeon: Ivin Poot, MD;  Location: [facility];  Service: Thoracic;  Laterality: Right;   CHEST TUBE INSERTION  04/21/2021   Procedure: CHEST TUBE INSERTION;  Surgeon: [name], DO;  Location: [facility];  Service: Pulmonary;;   COLONOSCOPY     CORONARY STENT INTERVENTION N/A 06/16/2019   Procedure: CORONARY STENT INTERVENTION;  Surgeon: Jettie Booze, MD;  Location: [facility];  Service: Cardiovascular;  Laterality: N/A;   DILATION AND CURETTAGE OF UTERUS     EYE SURGERY     due to Glaucoma   IR IMAGING GUIDED PORT INSERTION  03/22/2021   IR PORT REPAIR CENTRAL VENOUS ACCESS DEVICE  04/15/2021   LEFT HEART CATH AND CORONARY ANGIOGRAPHY N/A 06/16/2019   Procedure: LEFT HEART CATH AND CORONARY ANGIOGRAPHY;  Surgeon: Jettie Booze, MD;  Location: [facility];  Service: Cardiovascular;  Laterality: N/A;   REMOVAL OF PLEURAL DRAINAGE CATHETER Right 11/07/2018   Procedure: REMOVAL OF PLEURAL DRAINAGE CATHETER;  Surgeon: Ivin Poot, MD;  Location: [facility];  Service: Thoracic;  Laterality: Right;   ROTATOR CUFF REPAIR     TUBAL LIGATION     VIDEO BRONCHOSCOPY WITH ENDOBRONCHIAL NAVIGATION Bilateral 04/21/2021   Procedure: VIDEO BRONCHOSCOPY WITH ENDOBRONCHIAL NAVIGATION;  Surgeon: [name], DO;  Location: [facility];  Service: Pulmonary;  Laterality: Bilateral;  ION   WISDOM TOOTH EXTRACTION      REVIEW OF SYSTEMS:   Review of Systems  Constitutional: Positive for fatigue with overactivity/after treatment.  Negative for appetite change, chills, and fever.  HENT:   Negative for mouth sores, nosebleeds, sore throat and trouble swallowing.   Eyes: Negative for eye problems and icterus.  Respiratory: Positive for stable dyspnea with overactivity.  Negative for significant cough, hemoptysis and wheezing.    Cardiovascular: Negative for chest pain and leg swelling.  Gastrointestinal: Positive for indigestion following treatment and nausea and constipation. Negative for abdominal pain, diarrhea, and vomiting.  Genitourinary: Negative for bladder incontinence, difficulty urinating, dysuria, frequency and hematuria.   Musculoskeletal: Negative for back pain, gait problem, neck pain and neck stiffness.  Skin:  Negative for itching and rash.  Neurological: Negative for dizziness, extremity weakness, gait problem, headaches, light-headedness and seizures.  Hematological: Negative for adenopathy. Does not bruise/bleed easily.  Psychiatric/Behavioral: Negative for confusion, depression and sleep disturbance. The patient is not nervous/anxious.     PHYSICAL EXAMINATION:  Blood pressure (!) 174/86, pulse 71, temperature 98.1 F (36.7 C), temperature source Oral, resp. rate 14, weight 124 lb 12.8 oz (56.6 kg), SpO2 100 %.  ECOG PERFORMANCE STATUS: 1  Physical Exam  Constitutional: Oriented to person, place, and time and well-developed, well-nourished, and in no distress.  HENT:  Head: Normocephalic and atraumatic.  Mouth/Throat: Oropharynx is clear and moist. No oropharyngeal exudate.  Eyes: Conjunctivae are normal. Right eye exhibits no discharge. Left eye exhibits no discharge. No scleral icterus.  Neck: Normal range of motion. Neck supple.  Cardiovascular: Normal rate, regular rhythm, normal heart sounds and intact distal pulses.   Pulmonary/Chest: Effort normal and breath sounds normal. No respiratory distress. No wheezes. No rales.  Abdominal: Soft. Bowel sounds are normal. Exhibits no distension and no mass. There is no tenderness.  Musculoskeletal: Normal range of motion. Exhibits no edema.  Lymphadenopathy:    No cervical adenopathy.  Neurological: Alert and oriented to person, place, and time. Exhibits normal muscle tone. Gait normal. Coordination normal.  Skin: Skin is warm and dry. No rash  noted. Not diaphoretic. No erythema. No pallor.  Psychiatric: Mood, memory and judgment normal.  Vitals reviewed.  LABORATORY DATA: Lab Results  Component Value Date   WBC 4.1 07/20/2022   HGB 10.8 (L) 07/20/2022   HCT 33.6 (L) 07/20/2022   MCV 86.6 07/20/2022   PLT 210 07/20/2022      Chemistry      Component Value Date/Time   NA 138 07/20/2022 0824   NA 140 03/04/2021 1516   K 4.0 07/20/2022 0824   CL 105 07/20/2022 0824   CO2 25 07/20/2022 0824   BUN 22 07/20/2022 0824   BUN 21 03/04/2021 1516   CREATININE 1.12 (H) 07/20/2022 0824      Component Value Date/Time   CALCIUM 9.6 07/20/2022 0824   ALKPHOS 68 07/20/2022 0824   AST 31 07/20/2022 0824   ALT 30 07/20/2022 0824   BILITOT 0.3 07/20/2022 0824       RADIOGRAPHIC STUDIES:  No results found.   ASSESSMENT/PLAN:  This is a very pleasant 72 year old never smoker African-American female diagnosed with stage IV non-small cell lung cancer, adenocarcinoma.  She was positive for an EGFR mutation with deletion in exon 19.  She was diagnosed in July 2019 and presented with right upper lobe lung mass in addition to mediastinal lymphadenopathy as well as bilateral pulmonary nodules and malignant right pleural effusion.   The patient was started on treatment with Tagrisso 80 mg p.o. daily status post 39 months of treatment. Started on 01/29/2018.  In April 2022, she showed evidence of disease progression with interval progression of the right apical lung mass in addition to progression of mediastinal lymphadenopathy concerning for worsening of her disease. She had repeat Guardant 360 molecular studies which did not show evidence of new resistant mutations.  Therefore, she was referred to radiation oncology and completed palliative radiotherapy to the enlarging right upper lobe lung mass and mediastinum under the care of Dr. Sondra Come.  This was completed in July 2022.   Unfortunately, the patient  was found to have evidence of  disease progression.  Therefore, Dr. Julien Nordmann started the patient on systemic chemotherapy with carboplatin for AUC  of 5 and Alimta 500 mg per metered squared.  She is status post 23 cycles and she tolerated it fairly well despite her ongoing issues with fatigue, chest tightness, cough, and weight loss.  Alimta was reduced to 400 mg per metered squared due to renal insufficiency.  She is not a good candidate for Avastin due to her recent CVA in August 2022.  She also is continuing to take Tagrisso as it is protective against progressive metastatic disease to the brain. Starting from cycle #7,  she started maintenance single agent alimta.   The patient underwent a repeat bronchoscopy and biopsy for repeat molecular testing.  The sample from the left and right lung biopsy was negative for malignancy. This was performed in November 2022.    The patient saw Dr. Durenda Hurt from [facility] for second opinion in November 2022.  They discussed if she progresses on chemotherapy that there may be upcoming options. He discussed other treatment options which may be available in 2023 including patritumab deruxtcan and lazertinib/amivantmab. If these are not available, he wrote that they can determine if they can get the agents through an expanded access, single patient IND, or clinical trial if she has progression with chemotherapy.      Labs were reviewed.  Recommend that she proceed with cycle #24 today as scheduled.  We will see her back for follow-up visit in 3 weeks for evaluation repeat blood work before undergoing cycle #25  She will continue to follow with palliative care and counseling and exercise rehab.   I have changed the patient's premedication from Zofran to Compazine per patient request due to constipation.  I also told the patient she can take Colace twice daily as opposed to 1 time per day to see if that helps with her constipation.  If she needs additional recommendations for nausea she will  call back.  We discussed the option of Compazine, Phenergan, or scopolamine.  She will try Compazine at home every 6 hours as needed for nausea.  She will call back or send a MyChart message if she would like to try an additional antiemetic.  The patient was advised to call immediately if she has any concerning symptoms in the interval. The patient voices understanding of current disease status and treatment options and is in agreement with the current care plan. All questions were answered. The patient knows to call the clinic with any problems, questions or concerns. We can certainly see the patient much sooner if necessary     No orders of the defined types were placed in this encounter.    The total time spent in the appointment was 20-29 minutes.   Reily Ilic L Ceaira Ernster, PA-C 07/20/22

## 2022-07-20 ENCOUNTER — Ambulatory Visit: Payer: Medicare HMO | Admitting: Internal Medicine

## 2022-07-20 ENCOUNTER — Inpatient Hospital Stay (HOSPITAL_BASED_OUTPATIENT_CLINIC_OR_DEPARTMENT_OTHER): Payer: Medicare HMO | Admitting: Physician Assistant

## 2022-07-20 ENCOUNTER — Other Ambulatory Visit: Payer: Medicare HMO

## 2022-07-20 ENCOUNTER — Inpatient Hospital Stay: Payer: Medicare HMO

## 2022-07-20 ENCOUNTER — Ambulatory Visit: Payer: Medicare HMO

## 2022-07-20 ENCOUNTER — Inpatient Hospital Stay: Payer: Medicare HMO | Attending: Internal Medicine

## 2022-07-20 ENCOUNTER — Encounter (HOSPITAL_COMMUNITY)
Admission: RE | Admit: 2022-07-20 | Discharge: 2022-07-20 | Disposition: A | Discharge status: Home / Self Care | Payer: Medicare HMO | Source: Ambulatory Visit | Attending: Pulmonary Disease | Admitting: Pulmonary Disease

## 2022-07-20 VITALS — BP 174/86 | HR 71 | Temp 98.1°F | Resp 14 | Wt 124.8 lb

## 2022-07-20 VITALS — BP 144/79 | HR 68 | Temp 98.2°F

## 2022-07-20 DIAGNOSIS — J910 Malignant pleural effusion: Secondary | ICD-10-CM | POA: Insufficient documentation

## 2022-07-20 DIAGNOSIS — C3491 Malignant neoplasm of unspecified part of right bronchus or lung: Secondary | ICD-10-CM

## 2022-07-20 DIAGNOSIS — D649 Anemia, unspecified: Secondary | ICD-10-CM | POA: Insufficient documentation

## 2022-07-20 DIAGNOSIS — C7931 Secondary malignant neoplasm of brain: Secondary | ICD-10-CM | POA: Diagnosis present

## 2022-07-20 DIAGNOSIS — I10 Essential (primary) hypertension: Secondary | ICD-10-CM | POA: Insufficient documentation

## 2022-07-20 DIAGNOSIS — Z95828 Presence of other vascular implants and grafts: Secondary | ICD-10-CM

## 2022-07-20 DIAGNOSIS — Z5111 Encounter for antineoplastic chemotherapy: Secondary | ICD-10-CM | POA: Insufficient documentation

## 2022-07-20 DIAGNOSIS — Z79899 Other long term (current) drug therapy: Secondary | ICD-10-CM | POA: Insufficient documentation

## 2022-07-20 DIAGNOSIS — Z7952 Long term (current) use of systemic steroids: Secondary | ICD-10-CM | POA: Insufficient documentation

## 2022-07-20 DIAGNOSIS — J479 Bronchiectasis, uncomplicated: Secondary | ICD-10-CM | POA: Diagnosis present

## 2022-07-20 DIAGNOSIS — Z8673 Personal history of transient ischemic attack (TIA), and cerebral infarction without residual deficits: Secondary | ICD-10-CM | POA: Insufficient documentation

## 2022-07-20 DIAGNOSIS — C3411 Malignant neoplasm of upper lobe, right bronchus or lung: Secondary | ICD-10-CM | POA: Insufficient documentation

## 2022-07-20 LAB — CBC WITH DIFFERENTIAL (CANCER CENTER ONLY)
Abs Immature Granulocytes: 0.01 10*3/uL (ref 0.00–0.07)
Basophils Absolute: 0 10*3/uL (ref 0.0–0.1)
Basophils Relative: 0 %
Eosinophils Absolute: 0 10*3/uL (ref 0.0–0.5)
Eosinophils Relative: 0 %
HCT: 33.6 % — ABNORMAL LOW (ref 36.0–46.0)
Hemoglobin: 10.8 g/dL — ABNORMAL LOW (ref 12.0–15.0)
Immature Granulocytes: 0 %
Lymphocytes Relative: 15 %
Lymphs Abs: 0.6 10*3/uL — ABNORMAL LOW (ref 0.7–4.0)
MCH: 27.8 pg (ref 26.0–34.0)
MCHC: 32.1 g/dL (ref 30.0–36.0)
MCV: 86.6 fL (ref 80.0–100.0)
Monocytes Absolute: 0.2 10*3/uL (ref 0.1–1.0)
Monocytes Relative: 6 %
Neutro Abs: 3.2 10*3/uL (ref 1.7–7.7)
Neutrophils Relative %: 79 %
Platelet Count: 210 10*3/uL (ref 150–400)
RBC: 3.88 MIL/uL (ref 3.87–5.11)
RDW: 14 % (ref 11.5–15.5)
WBC Count: 4.1 10*3/uL (ref 4.0–10.5)
nRBC: 0 % (ref 0.0–0.2)

## 2022-07-20 LAB — CMP (CANCER CENTER ONLY)
ALT: 30 U/L (ref 0–44)
AST: 31 U/L (ref 15–41)
Albumin: 4 g/dL (ref 3.5–5.0)
Alkaline Phosphatase: 68 U/L (ref 38–126)
Anion gap: 8 (ref 5–15)
BUN: 22 mg/dL (ref 8–23)
CO2: 25 mmol/L (ref 22–32)
Calcium: 9.6 mg/dL (ref 8.9–10.3)
Chloride: 105 mmol/L (ref 98–111)
Creatinine: 1.12 mg/dL — ABNORMAL HIGH (ref 0.44–1.00)
GFR, Estimated: 53 mL/min — ABNORMAL LOW (ref >60)
Glucose, Bld: 112 mg/dL — ABNORMAL HIGH (ref 70–99)
Potassium: 4 mmol/L (ref 3.5–5.1)
Sodium: 138 mmol/L (ref 135–145)
Total Bilirubin: 0.3 mg/dL (ref 0.3–1.2)
Total Protein: 7.4 g/dL (ref 6.5–8.1)

## 2022-07-20 MED ORDER — PROCHLORPERAZINE MALEATE 10 MG PO TABS
10.0000 mg | ORAL_TABLET | Freq: Four times a day (QID) | ORAL | 2 refills | Status: DC | PRN
  Filled 2022-11-24: qty 30, 8d supply, fill #0

## 2022-07-20 MED ORDER — SODIUM CHLORIDE 0.9% FLUSH
10.0000 mL | Freq: Once | INTRAVENOUS | Status: AC
  Administered 2022-07-20: 10 mL

## 2022-07-20 MED ORDER — SODIUM CHLORIDE 0.9% FLUSH
10.0000 mL | INTRAVENOUS | Status: DC | PRN
  Administered 2022-07-20: 10 mL

## 2022-07-20 MED ORDER — PROCHLORPERAZINE MALEATE 10 MG PO TABS
10.0000 mg | ORAL_TABLET | Freq: Four times a day (QID) | ORAL | Status: DC | PRN
  Administered 2022-07-20: 10 mg via ORAL
  Filled 2022-07-20: qty 1

## 2022-07-20 MED ORDER — SODIUM CHLORIDE 0.9 % IV SOLN: Freq: Once | INTRAVENOUS | Status: AC

## 2022-07-20 MED ORDER — SODIUM CHLORIDE 0.9 % IV SOLN
400.0000 mg/m2 | Freq: Once | INTRAVENOUS | Status: AC
  Administered 2022-07-20: 600 mg via INTRAVENOUS
  Filled 2022-07-20: qty 20

## 2022-07-20 MED ORDER — HEPARIN SOD (PORK) LOCK FLUSH 100 UNIT/ML IV SOLN
500.0000 [IU] | Freq: Once | INTRAVENOUS | Status: AC | PRN
  Administered 2022-07-20: 500 [IU]

## 2022-07-20 MED ORDER — CYANOCOBALAMIN 1000 MCG/ML IJ SOLN
1000.0000 ug | Freq: Once | INTRAMUSCULAR | Status: AC
  Administered 2022-07-20: 1000 ug via INTRAMUSCULAR
  Filled 2022-07-20: qty 1

## 2022-07-20 NOTE — Patient Instructions (Addendum)
[facility]  Discharge Instructions: Thank you for choosing [facility] to provide your oncology and hematology care.   If you have a lab appointment with the [facility], please go directly to the [facility] and check in at the registration area.   Wear comfortable clothing and clothing appropriate for easy access to any Portacath or PICC line.   We strive to give you quality time with your provider. You may need to reschedule your appointment if you arrive late (15 or more minutes).  Arriving late affects you and other patients whose appointments are after yours.  Also, if you miss three or more appointments without notifying the office, you may be dismissed from the clinic at the provider's discretion.      For prescription refill requests, have your pharmacy contact our office and allow 72 hours for refills to be completed.    Today you received the following chemotherapy and/or immunotherapy agent: Pemetrexed (Alimta)   To help prevent nausea and vomiting after your treatment, we encourage you to take your nausea medication as directed.  BELOW ARE SYMPTOMS THAT SHOULD BE REPORTED IMMEDIATELY: *FEVER GREATER THAN 100.4 F (38 C) OR HIGHER *CHILLS OR SWEATING *NAUSEA AND VOMITING THAT IS NOT CONTROLLED WITH YOUR NAUSEA MEDICATION *UNUSUAL SHORTNESS OF BREATH *UNUSUAL BRUISING OR BLEEDING *URINARY PROBLEMS (pain or burning when urinating, or frequent urination) *BOWEL PROBLEMS (unusual diarrhea, constipation, pain near the anus) TENDERNESS IN MOUTH AND THROAT WITH OR WITHOUT PRESENCE OF ULCERS (sore throat, sores in mouth, or a toothache) UNUSUAL RASH, SWELLING OR PAIN  UNUSUAL VAGINAL DISCHARGE OR ITCHING   Items with * indicate a potential emergency and should be followed up as soon as possible or go to the Emergency Department if any problems should occur.  Please show the CHEMOTHERAPY ALERT CARD or IMMUNOTHERAPY ALERT CARD at  check-in to the Emergency Department and triage nurse.  Should you have questions after your visit or need to cancel or reschedule your appointment, please contact [facility]  Dept: [phone]  and follow the prompts.  Office hours are 8:00 a.m. to 4:30 p.m. Monday - Friday. Please note that voicemails left after 4:00 p.m. may not be returned until the following business day.  We are closed weekends and major holidays. You have access to a nurse at all times for urgent questions. Please call the main number to the clinic Dept: [phone] and follow the prompts.   For any non-urgent questions, you may also contact your provider using MyChart. We now offer e-Visits for anyone 25 and older to request care online for non-urgent symptoms. For details visit [email or web address].   Also download the MyChart app! Go to the app store, search "MyChart", open the app, select [facility], and log in with your MyChart username and password.  Pemetrexed Injection What is this medication? PEMETREXED (PEM e TREX ed) treats some types of cancer. It works by slowing down the growth of cancer cells. This medicine may be used for other purposes; ask your health care provider or pharmacist if you have questions. COMMON BRAND NAME(S): Alimta, PEMFEXY What should I tell my care team before I take this medication? They need to know if you have any of these conditions: Infection, such as chickenpox, cold sores, or herpes Kidney disease Low blood cell levels (white cells, red cells, and platelets) Lung or breathing disease, such as asthma Radiation therapy An unusual or allergic reaction to  pemetrexed, other medications, foods, dyes, or preservatives If you or your partner are pregnant or trying to get pregnant Breast-feeding How should I use this medication? This medication is injected into a vein. It is given by your care team in a hospital or clinic setting. Talk to  your care team about the use of this medication in children. Special care may be needed. Overdosage: If you think you have taken too much of this medicine contact a poison control center or emergency room at once. NOTE: This medicine is only for you. Do not share this medicine with others. What if I miss a dose? Keep appointments for follow-up doses. It is important not to miss your dose. Call your care team if you are unable to keep an appointment. What may interact with this medication? Do not take this medication with any of the following: Live virus vaccines This medication may also interact with the following: Ibuprofen This list may not describe all possible interactions. Give your health care provider a list of all the medicines, herbs, non-prescription drugs, or dietary supplements you use. Also tell them if you smoke, drink alcohol, or use illegal drugs. Some items may interact with your medicine. What should I watch for while using this medication? Your condition will be monitored carefully while you are receiving this medication. This medication may make you feel generally unwell. This is not uncommon as chemotherapy can affect healthy cells as well as cancer cells. Report any side effects. Continue your course of treatment even though you feel ill unless your care team tells you to stop. This medication can cause serious side effects. To reduce the risk, your care team may give you other medications to take before receiving this one. Be sure to follow the directions from your care team. This medication can cause a rash or redness in areas of the body that have previously had radiation therapy. If you have had radiation therapy, tell your care team if you notice a rash in this area. This medication may increase your risk of getting an infection. Call your care team for advice if you get a fever, chills, sore throat, or other symptoms of a cold or flu. Do not treat yourself. Try to avoid  being around people who are sick. Be careful brushing or flossing your teeth or using a toothpick because you may get an infection or bleed more easily. If you have any dental work done, tell your dentist you are receiving this medication. Avoid taking medications that contain aspirin, acetaminophen, ibuprofen, naproxen, or ketoprofen unless instructed by your care team. These medications may hide a fever. Check with your care team if you have severe diarrhea, nausea, and vomiting, or if you sweat a lot. The loss of too much body fluid may make it dangerous for you to take this medication. Talk to your care team if you or your partner wish to become pregnant or think either of you might be pregnant. This medication can cause serious birth defects if taken during pregnancy and for 6 months after the last dose. A negative pregnancy test is required before starting this medication. A reliable form of contraception is recommended while taking this medication and for 6 months after the last dose. Talk to your care team about reliable forms of contraception. Do not father a child while taking this medication and for 3 months after the last dose. Use a condom while having sex during this time period. Do not breastfeed while taking this medication and  for 1 week after the last dose. This medication may cause infertility. Talk to your care team if you are concerned about your fertility. What side effects may I notice from receiving this medication? Side effects that you should report to your care team as soon as possible: Allergic reactions--skin rash, itching, hives, swelling of the face, lips, tongue, or throat Dry cough, shortness of breath or trouble breathing Infection--fever, chills, cough, sore throat, wounds that don't heal, pain or trouble when passing urine, general feeling of discomfort or being unwell Kidney injury--decrease in the amount of urine, swelling of the ankles, hands, or feet Low red blood  cell level--unusual weakness or fatigue, dizziness, headache, trouble breathing Redness, blistering, peeling, or loosening of the skin, including inside the mouth Unusual bruising or bleeding Side effects that usually do not require medical attention (report to your care team if they continue or are bothersome): Fatigue Loss of appetite Nausea Vomiting This list may not describe all possible side effects. Call your doctor for medical advice about side effects. You may report side effects to FDA at 1-800-FDA-1088. Where should I keep my medication? This medication is given in a hospital or clinic. It will not be stored at home. NOTE: This sheet is a summary. It may not cover all possible information. If you have questions about this medicine, talk to your doctor, pharmacist, or health care provider.  2023 Elsevier/Gold Standard (2021-10-10 00:00:00)  Vitamin B12 Injection What is this medication? Vitamin B12 (VAHY tuh min B12) prevents and treats low vitamin B12 levels in your body. It is used in people who do not get enough vitamin B12 from their diet or when their digestive tract does not absorb enough. Vitamin B12 plays an important role in maintaining the health of your nervous system and red blood cells. This medicine may be used for other purposes; ask your health care provider or pharmacist if you have questions. COMMON BRAND NAME(S): B-12 Compliance Kit, B-12 Injection Kit, Cyomin, Dodex, LA-12, Nutri-Twelve, Physicians EZ Use B-12, Primabalt What should I tell my care team before I take this medication? They need to know if you have any of these conditions: Kidney disease Leber's disease Megaloblastic anemia An unusual or allergic reaction to cyanocobalamin, cobalt, other medications, foods, dyes, or preservatives Pregnant or trying to get pregnant Breast-feeding How should I use this medication? This medication is injected into a muscle or deeply under the skin. It is usually  given in a clinic or care team's office. However, your care team may teach you how to inject yourself. Follow all instructions. Talk to your care team about the use of this medication in children. Special care may be needed. Overdosage: If you think you have taken too much of this medicine contact a poison control center or emergency room at once. NOTE: This medicine is only for you. Do not share this medicine with others. What if I miss a dose? If you are given your dose at a clinic or care team's office, call to reschedule your appointment. If you give your own injections, and you miss a dose, take it as soon as you can. If it is almost time for your next dose, take only that dose. Do not take double or extra doses. What may interact with this medication? Alcohol Colchicine This list may not describe all possible interactions. Give your health care provider a list of all the medicines, herbs, non-prescription drugs, or dietary supplements you use. Also tell them if you smoke, drink alcohol,  or use illegal drugs. Some items may interact with your medicine. What should I watch for while using this medication? Visit your care team regularly. You may need blood work done while you are taking this medication. You may need to follow a special diet. Talk to your care team. Limit your alcohol intake and avoid smoking to get the best benefit. What side effects may I notice from receiving this medication? Side effects that you should report to your care team as soon as possible: Allergic reactions--skin rash, itching, hives, swelling of the face, lips, tongue, or throat Swelling of the ankles, hands, or feet Trouble breathing Side effects that usually do not require medical attention (report to your care team if they continue or are bothersome): Diarrhea This list may not describe all possible side effects. Call your doctor for medical advice about side effects. You may report side effects to FDA at  1-800-FDA-1088. Where should I keep my medication? Keep out of the reach of children. Store at room temperature between 15 and 30 degrees C (59 and 85 degrees F). Protect from light. Throw away any unused medication after the expiration date. NOTE: This sheet is a summary. It may not cover all possible information. If you have questions about this medicine, talk to your doctor, pharmacist, or health care provider.  2023 Elsevier/Gold Standard (2007-07-27 00:00:00)

## 2022-07-20 NOTE — Progress Notes (Signed)
Daily Session Note  Patient Details  Name: Allison [name] MRN: [id number] Date of Birth: 1951-01-21 Referring Provider:   April Manson Pulmonary Rehab Walk Test from 07/12/2022 in [facility]  Referring Provider Icard       Encounter Date: 07/20/2022  Check In:   Capillary Blood Glucose: Results for orders placed or performed in visit on 07/20/22 (from the past 24 hour(s))  CMP ([facility] only)     Status: Abnormal   Collection Time: 07/20/22  8:24 AM  Result Value Ref Range   Sodium 138 135 - 145 mmol/L   Potassium 4.0 3.5 - 5.1 mmol/L   Chloride 105 98 - 111 mmol/L   CO2 25 22 - 32 mmol/L   Glucose, Bld 112 (H) 70 - 99 mg/dL   BUN 22 8 - 23 mg/dL   Creatinine 1.12 (H) 0.44 - 1.00 mg/dL   Calcium 9.6 8.9 - 10.3 mg/dL   Total Protein 7.4 6.5 - 8.1 g/dL   Albumin 4.0 3.5 - 5.0 g/dL   AST 31 15 - 41 U/L   ALT 30 0 - 44 U/L   Alkaline Phosphatase 68 38 - 126 U/L   Total Bilirubin 0.3 0.3 - 1.2 mg/dL   GFR, Estimated 53 (L) >60 mL/min   Anion gap 8 5 - 15  CBC with Differential ([facility] Only)     Status: Abnormal   Collection Time: 07/20/22  8:24 AM  Result Value Ref Range   WBC Count 4.1 4.0 - 10.5 K/uL   RBC 3.88 3.87 - 5.11 MIL/uL   Hemoglobin 10.8 (L) 12.0 - 15.0 g/dL   HCT 33.6 (L) 36.0 - 46.0 %   MCV 86.6 80.0 - 100.0 fL   MCH 27.8 26.0 - 34.0 pg   MCHC 32.1 30.0 - 36.0 g/dL   RDW 14.0 11.5 - 15.5 %   Platelet Count 210 150 - 400 K/uL   nRBC 0.0 0.0 - 0.2 %   Neutrophils Relative % 79 %   Neutro Abs 3.2 1.7 - 7.7 K/uL   Lymphocytes Relative 15 %   Lymphs Abs 0.6 (L) 0.7 - 4.0 K/uL   Monocytes Relative 6 %   Monocytes Absolute 0.2 0.1 - 1.0 K/uL   Eosinophils Relative 0 %   Eosinophils Absolute 0.0 0.0 - 0.5 K/uL   Basophils Relative 0 %   Basophils Absolute 0.0 0.0 - 0.1 K/uL   Immature Granulocytes 0 %   Abs Immature Granulocytes 0.01 0.00 - 0.07 K/uL   *Note: Due to a large number of  results and/or encounters for the requested time period, some results have not been displayed. A complete set of results can be found in Results Review.      Social History   Tobacco Use  Smoking Status Never  Smokeless Tobacco Never    Goals Met:  Proper associated with RPD/PD & O2 Sat Independence with exercise equipment Exercise tolerated well No report of concerns or symptoms today Strength training completed today  Goals Unmet:  Not Applicable  Comments: Service time is from 1326 to 1440.    Dr. Rodman Pickle is Medical Director for Pulmonary Rehab at [facility].

## 2022-07-21 ENCOUNTER — Other Ambulatory Visit: Payer: Self-pay | Admitting: Physician Assistant

## 2022-07-21 ENCOUNTER — Encounter: Payer: Self-pay | Admitting: Internal Medicine

## 2022-07-21 ENCOUNTER — Encounter: Payer: Self-pay | Admitting: Physician Assistant

## 2022-07-21 DIAGNOSIS — D509 Iron deficiency anemia, unspecified: Secondary | ICD-10-CM

## 2022-07-25 ENCOUNTER — Encounter (HOSPITAL_COMMUNITY): Payer: Medicare HMO

## 2022-07-27 ENCOUNTER — Encounter (HOSPITAL_COMMUNITY)
Admission: RE | Admit: 2022-07-27 | Discharge: 2022-07-27 | Disposition: A | Discharge status: Home / Self Care | Payer: Medicare HMO | Source: Ambulatory Visit | Attending: Pulmonary Disease | Admitting: Pulmonary Disease

## 2022-07-27 DIAGNOSIS — J479 Bronchiectasis, uncomplicated: Secondary | ICD-10-CM | POA: Diagnosis not present

## 2022-07-27 NOTE — Progress Notes (Signed)
Daily Session Note  Patient Details  Name: Allison [name] MRN: [id number] Date of Birth: Nov 15, 1950 Referring Provider:   April Manson Pulmonary Rehab Walk Test from 07/12/2022 in [facility]  Referring Provider Icard       Encounter Date: 07/27/2022  Check In:  Session Check In - 07/27/22 1452       Check-In   Supervising physician immediately available to respond to emergencies [facility] MD immediately available    Physician(s) Dr Vaughan Browner    Location [facility]    Staff Present Janine Ores, RN, Quentin Ore, MS, ACSM-CEP, Exercise Physiologist;Samantha [place], RD, LDN;Jetta Walker BS, ACSM-CEP, Exercise Physiologist;Other    Virtual Visit No    Medication changes reported     No    Fall or balance concerns reported    No    Tobacco Cessation No Change    Warm-up and Cool-down Performed as group-led instruction    Resistance Training Performed Yes    VAD Patient? No    PAD/SET Patient? No      Pain Assessment   Currently in Pain? No/denies    Multiple Pain Sites No             Capillary Blood Glucose: No results found. However, due to the size of the patient record, not all encounters were searched. Please check Results Review for a complete set of results.    Social History   Tobacco Use  Smoking Status Never  Smokeless Tobacco Never    Goals Met:  Proper associated with RPD/PD & O2 Sat Independence with exercise equipment Exercise tolerated well No report of concerns or symptoms today Strength training completed today  Goals Unmet:  Not Applicable  Comments: Service time is from 1323 to 1438.    Dr. Rodman Pickle is Medical Director for Pulmonary Rehab at [facility].

## 2022-08-01 ENCOUNTER — Encounter (HOSPITAL_COMMUNITY)
Admission: RE | Admit: 2022-08-01 | Discharge: 2022-08-01 | Disposition: A | Discharge status: Home / Self Care | Payer: Medicare HMO | Source: Ambulatory Visit | Attending: Pulmonary Disease | Admitting: Pulmonary Disease

## 2022-08-01 VITALS — Wt 124.8 lb

## 2022-08-01 DIAGNOSIS — J479 Bronchiectasis, uncomplicated: Secondary | ICD-10-CM

## 2022-08-01 NOTE — Progress Notes (Signed)
Daily Session Note  Patient Details  Name: Allison [name] MRN: [id number] Date of Birth: February 06, 1951 Referring Provider:   April Manson Pulmonary Rehab Walk Test from 07/12/2022 in [facility] [place]  Referring Provider Icard       Encounter Date: 08/01/2022  Check In:  Session Check In - 08/01/22 1542       Check-In   Supervising physician immediately available to respond to emergencies [facility] MD immediately available    Physician(s) Sharolyn Douglas    Location [facility]    Staff Present Janine Ores, RN, Quentin Ore, MS, ACSM-CEP, Exercise Physiologist;Carlette Wilber Oliphant, RN, BSN;Randi Reeve BS, ACSM-CEP, Exercise Physiologist    Virtual Visit No    Medication changes reported     No    Fall or balance concerns reported    No    Tobacco Cessation No Change    Warm-up and Cool-down Performed as group-led instruction    Resistance Training Performed Yes    VAD Patient? No    PAD/SET Patient? No      Pain Assessment   Currently in Pain? No/denies    Pain Score 0-No pain    Multiple Pain Sites No             Capillary Blood Glucose: No results found. However, due to the size of the patient record, not all encounters were searched. Please check Results Review for a complete set of results.   Exercise Prescription Changes - 08/01/22 1600       Response to Exercise   Blood Pressure (Admit) 120/64    Blood Pressure (Exercise) 118/60    Blood Pressure (Exit) 112/66    Heart Rate (Admit) 80 bpm    Heart Rate (Exercise) 84 bpm    Heart Rate (Exit) 91 bpm    Oxygen Saturation (Admit) 100 %    Oxygen Saturation (Exercise) 99 %    Oxygen Saturation (Exit) 99 %    Rating of Perceived Exertion (Exercise) 12    Perceived Dyspnea (Exercise) 1    Duration Continue with 30 min of aerobic exercise without signs/symptoms of physical distress.    Intensity THRR unchanged      Progression   Progression Continue to  progress workloads to maintain intensity without signs/symptoms of physical distress.      Resistance Training   Training Prescription Yes    Weight red bands    Reps 10-15    Time 10 Minutes      NuStep   Level 1    SPM 60    Minutes 15    METs 1.7      Track   Laps 7    Minutes 15    METs 2.62             Social History   Tobacco Use  Smoking Status Never  Smokeless Tobacco Never    Goals Met:  Proper associated with RPD/PD & O2 Sat Personal goals reviewed No report of concerns or symptoms today Strength training completed today  Goals Unmet:  Not Applicable  Comments: Service time is from 1318 to 1450.    Dr. Rodman Pickle is Medical Director for Pulmonary Rehab at [facility].

## 2022-08-02 NOTE — Progress Notes (Signed)
Pulmonary Individual Treatment Plan  Patient Details  Name: Allison [name] MRN: [id number] Date of Birth: February 05, 1951 Referring Provider:   April Manson Pulmonary Rehab Walk Test from 07/12/2022 in [facility] [place]  Referring Provider Icard       Initial Encounter Date:  Flowsheet Row Pulmonary Rehab Walk Test from 07/12/2022 in [facility] [place]  Date 07/12/22       Visit Diagnosis: Bronchiectasis without complication ([place])  Patient's Home Medications on Admission:   Current Outpatient Medications:    acetaminophen (TYLENOL) 500 MG tablet, Take 1,000 mg by mouth every 6 (six) hours as needed (pain)., Disp: , Rfl:    ALPRAZolam (XANAX) 0.25 MG tablet, Take 1 tablet (0.25 mg total) by mouth 2 (two) times daily as needed for anxiety. May take 2 tablets (0.5 mg total) at bedtime., Disp: 60 tablet, Rfl: 0   amLODipine (NORVASC) 5 MG tablet, Take 1.5 tablets (7.5 mg total) by mouth daily., Disp: , Rfl:    brompheniramine-pseudoephedrine-DM 30-2-10 MG/5ML syrup, Take 5 mLs by mouth 3 (three) times daily as needed., Disp: 120 mL, Rfl: 0   carvedilol (COREG) 25 MG tablet, Take 0.5 tablets (12.5 mg total) by mouth 2 (two) times daily., Disp: , Rfl:    cetirizine (ZYRTEC) 5 MG tablet, Take 5 mg by mouth daily. (Patient not taking: Reported on 07/12/2022), Disp: , Rfl:    dexamethasone (DECADRON) 4 MG tablet, TAKE 1 TABLET TWICE A DAY THE DAY BEFORE, THE DAY OF, AND THE DAY AFTER CHEMOTHERAPY, Disp: 40 tablet, Rfl: 2   docusate sodium (COLACE) 100 MG capsule, Take 1 capsule (100 mg total) by mouth every other day., Disp: 15 capsule, Rfl: 5   dorzolamide-timolol (COSOPT) 22.3-6.8 MG/ML ophthalmic solution, Place 1 drop into both eyes 2 (two) times daily., Disp: , Rfl:    dronabinol (MARINOL) 2.5 MG capsule, Take 1 capsule (2.5 mg total) by mouth 2 (two) times daily as needed (decreased  appetite)., Disp: 60 capsule, Rfl: 0   enoxaparin (LOVENOX) 80 MG/0.8ML injection, Inject 80 mg into the skin daily., Disp: , Rfl:    esomeprazole (NEXIUM) 20 MG capsule, Take 1 capsule (20 mg total) by mouth in the morning and at bedtime. (Patient not taking: Reported on 07/12/2022), Disp: 60 capsule, Rfl: 2   ezetimibe (ZETIA) 10 MG tablet, Take 10 mg by mouth daily., Disp: , Rfl:    FeFum-FePoly-FA-B Cmp-C-Biot (FOLIVANE-PLUS) CAPS, TAKE 1 CAPSULE BY MOUTH EVERY DAY IN THE MORNING, Disp: 90 capsule, Rfl: 0   FLUoxetine (PROZAC) 20 MG capsule, Take 1 capsule (20 mg total) by mouth daily., Disp: 90 capsule, Rfl: 3   folic acid (FOLVITE) 1 MG tablet, Take 1 tablet (1 mg total) by mouth daily., Disp: 90 tablet, Rfl: 1   hyoscyamine (LEVSIN SL) 0.125 MG SL tablet, Place 2 tablets (0.25 mg total) under the tongue every 6 (six) hours as needed for cramping. Take 1-2 tablets under the tongue every 6 hours as needed for cramping, Disp: 30 tablet, Rfl: 0   ipratropium-albuterol (DUONEB) 0.5-2.5 (3) MG/3ML SOLN, Take 3 mLs by nebulization every 6 (six) hours as needed (Asthma)., Disp: , Rfl:    isosorbide mononitrate (IMDUR) 30 MG 24 hr tablet, TAKE 1 TABLET BY MOUTH EVERY DAY, Disp: 90 tablet, Rfl: 3   latanoprost (XALATAN) 0.005 % ophthalmic solution, Place 1 drop into both eyes at bedtime. , Disp: , Rfl:    ondansetron (  ZOFRAN-ODT) 4 MG disintegrating tablet, Take 1 tablet (4 mg total) by mouth every 8 (eight) hours as needed for nausea or vomiting., Disp: 30 tablet, Rfl: 1   osimertinib mesylate (TAGRISSO) 80 MG tablet, Take 1 tablet (80 mg total) by mouth daily., Disp: 30 tablet, Rfl: 4   potassium chloride SA (KLOR-CON M) 20 MEQ tablet, Take 1 tablet (20 mEq total) by mouth daily., Disp: 7 tablet, Rfl: 0   prochlorperazine (COMPAZINE) 10 MG tablet, Take 1 tablet (10 mg total) by mouth every 6 (six) hours as needed., Disp: 30 tablet, Rfl: 2   REPATHA SURECLICK 154 MG/ML SOAJ, INJECT 140 MG INTO THE SKIN  EVERY 14 (FOURTEEN) DAYS., Disp: 6 mL, Rfl: 3   valACYclovir (VALTREX) 500 MG tablet, , Disp: , Rfl:   Past Medical History: Past Medical History:  Diagnosis Date   Anemia    Anxiety    Arthritis    Asthma    exercise induced   Coronary artery disease    Depression    PMH   Dyspnea    GERD (gastroesophageal reflux disease)    Glaucoma    History of radiation therapy 01/05/2021   IMRT right lung  11/24/2020-01/05/2021  Dr [name]   Hypertension    lung ca dx'd 11/2017   right   Malignant pleural effusion    right   Nuclear sclerotic cataract of right eye 02/28/2021   Dr. Warden Fillers, cataract surgery February 2023   PONV (postoperative nausea and vomiting)    Pre-diabetes    Raynaud's disease    Raynaud's disease    Stroke ([place]) 01/2021   balance off, some express aphasia, weakness    Tobacco Use: Social History   Tobacco Use  Smoking Status Never  Smokeless Tobacco Never    Labs: Review Flowsheet  More data exists      Latest Ref Rng & Units 09/13/2020 11/01/2020 02/07/2021 02/12/2021 04/21/2021  Labs for ITP Cardiac and Pulmonary Rehab  Cholestrol 0 - 200 mg/dL 157  153  - 175  -  LDL (calc) 0 - 99 mg/dL 79  74  - 91  -  HDL-C >40 mg/dL 64  64  - 62  -  Trlycerides <150 mg/dL 73  77  - 110  -  Hemoglobin A1c 4.8 - 5.6 % - - - 5.7  -  Bicarbonate 20.0 - 28.0 mmol/L - - 23.4  - -  TCO2 22 - 32 mmol/L - - - - 26   Acid-base deficit 0.0 - 2.0 mmol/L - - 0.2  - -  O2 Saturation % - - 76.1  - -    Capillary Blood Glucose: Lab Results  Component Value Date   GLUCAP 94 04/21/2021   GLUCAP 88 11/09/2020   GLUCAP 87 12/26/2017     Pulmonary Assessment Scores:  Pulmonary Assessment Scores     Row Name 07/12/22 1055         ADL UCSD   SOB Score total 45       CAT Score   CAT Score 33       mMRC Score   mMRC Score 3             UCSD: Self-administered rating of dyspnea associated with activities of daily living (ADLs) 6-point scale (0 =  "not at all" to 5 = "maximal or unable to do because of breathlessness")  Scoring Scores range from 0 to 120.  Minimally important difference is 5 units  CAT: CAT  can identify the health impairment of COPD patients and is better correlated with disease progression.  CAT has a scoring range of zero to 40. The CAT score is classified into four groups of low (less than 10), medium (10 - 20), high (21-30) and very high (31-40) based on the impact level of disease on health status. A CAT score over 10 suggests significant symptoms.  A worsening CAT score could be explained by an exacerbation, poor medication adherence, poor inhaler technique, or progression of COPD or comorbid conditions.  CAT MCID is 2 points  mMRC: mMRC (Modified Medical Research Council) Dyspnea Scale is used to assess the degree of baseline functional disability in patients of respiratory disease due to dyspnea. No minimal important difference is established. A decrease in score of 1 point or greater is considered a positive change.   Pulmonary Function Assessment:  Pulmonary Function Assessment - 07/12/22 1133       Breath   Bilateral Breath Sounds Decreased;Rhonchi    Shortness of Breath Yes;Limiting activity             Exercise Target Goals: Exercise Program Goal: Individual exercise prescription set using results from initial 6 min walk test and THRR while considering  patient's activity barriers and safety.   Exercise Prescription Goal: Initial exercise prescription builds to 30-45 minutes a day of aerobic activity, 2-3 days per week.  Home exercise guidelines will be given to patient during program as part of exercise prescription that the participant will acknowledge.  Activity Barriers & Risk Stratification:  Activity Barriers & Cardiac Risk Stratification - 07/12/22 1053       Activity Barriers & Cardiac Risk Stratification   Activity Barriers Muscular Weakness;Deconditioning;Shortness of  Breath;Assistive Device;Balance Concerns             6 Minute Walk:  6 Minute Walk     Row Name 07/12/22 1154         6 Minute Walk   Phase Initial     Distance 920 feet     Walk Time 6 minutes     # of Rest Breaks 0     MPH 1.74     METS 2.3     RPE 12     Perceived Dyspnea  0.5     VO2 Peak 8.05     Symptoms No     Resting HR 82 bpm     Resting BP 130/60     Resting Oxygen Saturation  100 %     Exercise Oxygen Saturation  during 6 min walk 95 %     Max Ex. HR 94 bpm     Max Ex. BP 120/58     2 Minute Post BP 112/60       Interval HR   1 Minute HR 94     2 Minute HR 92     3 Minute HR 93     4 Minute HR 93     5 Minute HR 91     6 Minute HR 91     2 Minute Post HR 72     Interval Heart Rate? Yes       Interval Oxygen   Interval Oxygen? Yes     Baseline Oxygen Saturation % 100 %     1 Minute Oxygen Saturation % 95 %     1 Minute Liters of Oxygen 0 L     2 Minute Oxygen Saturation % 95 %     2 Minute  Liters of Oxygen 0 L     3 Minute Oxygen Saturation % 97 %     3 Minute Liters of Oxygen 0 L     4 Minute Oxygen Saturation % 97 %     4 Minute Liters of Oxygen 0 L     5 Minute Oxygen Saturation % 96 %     5 Minute Liters of Oxygen 0 L     6 Minute Oxygen Saturation % 98 %     6 Minute Liters of Oxygen 0 L     2 Minute Post Oxygen Saturation % 97 %     2 Minute Post Liters of Oxygen 0 L              Oxygen Initial Assessment:  Oxygen Initial Assessment - 07/12/22 1054       Home Oxygen   Home Oxygen Device None    Sleep Oxygen Prescription None    Home Exercise Oxygen Prescription None    Home Resting Oxygen Prescription None      Initial 6 min Walk   Oxygen Used None      Program Oxygen Prescription   Program Oxygen Prescription None      Intervention   Short Term Goals To learn and understand importance of monitoring SPO2 with pulse oximeter and demonstrate accurate use of the pulse oximeter.;To learn and understand importance of  maintaining oxygen saturations>88%;To learn and demonstrate proper pursed lip breathing techniques or other breathing techniques. ;To learn and demonstrate proper use of respiratory medications    Long  Term Goals Verbalizes importance of monitoring SPO2 with pulse oximeter and return demonstration;Maintenance of O2 saturations>88%;Exhibits proper breathing techniques, such as pursed lip breathing or other method taught during program session;Compliance with respiratory medication;Demonstrates proper use of MDI's             Oxygen Re-Evaluation:  Oxygen Re-Evaluation     Row Name 07/24/22 1112             Program Oxygen Prescription   Program Oxygen Prescription None         Home Oxygen   Home Oxygen Device None       Sleep Oxygen Prescription None       Home Exercise Oxygen Prescription None       Home Resting Oxygen Prescription None         Goals/Expected Outcomes   Short Term Goals To learn and understand importance of monitoring SPO2 with pulse oximeter and demonstrate accurate use of the pulse oximeter.;To learn and understand importance of maintaining oxygen saturations>88%;To learn and demonstrate proper pursed lip breathing techniques or other breathing techniques. ;To learn and demonstrate proper use of respiratory medications       Long  Term Goals Verbalizes importance of monitoring SPO2 with pulse oximeter and return demonstration;Maintenance of O2 saturations>88%;Exhibits proper breathing techniques, such as pursed lip breathing or other method taught during program session;Compliance with respiratory medication;Demonstrates proper use of MDI's       Goals/Expected Outcomes Compliance and understanding of oxygen saturations monitoring and breathing techniques to decrease shortness of breath.                Oxygen Discharge (Final Oxygen Re-Evaluation):  Oxygen Re-Evaluation - 07/24/22 1112       Program Oxygen Prescription   Program Oxygen Prescription None       Home Oxygen   Home Oxygen Device None    Sleep Oxygen Prescription None    Home  Exercise Oxygen Prescription None    Home Resting Oxygen Prescription None      Goals/Expected Outcomes   Short Term Goals To learn and understand importance of monitoring SPO2 with pulse oximeter and demonstrate accurate use of the pulse oximeter.;To learn and understand importance of maintaining oxygen saturations>88%;To learn and demonstrate proper pursed lip breathing techniques or other breathing techniques. ;To learn and demonstrate proper use of respiratory medications    Long  Term Goals Verbalizes importance of monitoring SPO2 with pulse oximeter and return demonstration;Maintenance of O2 saturations>88%;Exhibits proper breathing techniques, such as pursed lip breathing or other method taught during program session;Compliance with respiratory medication;Demonstrates proper use of MDI's    Goals/Expected Outcomes Compliance and understanding of oxygen saturations monitoring and breathing techniques to decrease shortness of breath.             Initial Exercise Prescription:  Initial Exercise Prescription - 07/12/22 1100       Date of Initial Exercise RX and Referring Provider   Date 07/12/22    Referring Provider Icard    Expected Discharge Date 09/14/22      NuStep   Level 1    SPM 60    Minutes 15      Track   Minutes 15    METs 2.3      Prescription Details   Frequency (times per week) 2    Duration Progress to 30 minutes of continuous aerobic without signs/symptoms of physical distress      Intensity   THRR 40-80% of Max Heartrate 60-119    Ratings of Perceived Exertion 11-13    Perceived Dyspnea 0-4      Progression   Progression Continue to progress workloads to maintain intensity without signs/symptoms of physical distress.      Resistance Training   Training Prescription Yes    Weight red bands    Reps 10-15             Perform Capillary Blood Glucose checks  as needed.  Exercise Prescription Changes:   Exercise Prescription Changes     Row Name 07/18/22 1500 08/01/22 1600           Response to Exercise   Blood Pressure (Admit) 150/76 120/64      Blood Pressure (Exercise) 138/72 118/60      Blood Pressure (Exit) 128/70 112/66      Heart Rate (Admit) 79 bpm 80 bpm      Heart Rate (Exercise) 83 bpm 84 bpm      Heart Rate (Exit) 87 bpm 91 bpm      Oxygen Saturation (Admit) 100 % 100 %      Oxygen Saturation (Exercise) 100 % 99 %      Oxygen Saturation (Exit) 97 % 99 %      Rating of Perceived Exertion (Exercise) 14 12      Perceived Dyspnea (Exercise) 2 1      Duration Continue with 30 min of aerobic exercise without signs/symptoms of physical distress. Continue with 30 min of aerobic exercise without signs/symptoms of physical distress.      Intensity THRR unchanged THRR unchanged        Progression   Progression Continue to progress workloads to maintain intensity without signs/symptoms of physical distress. Continue to progress workloads to maintain intensity without signs/symptoms of physical distress.        Resistance Training   Training Prescription Yes Yes      Weight red bands red bands  Reps 10-15 10-15      Time 10 Minutes 10 Minutes        NuStep   Level 1 1      SPM 60 60      Minutes 15 15      METs 1.2 1.7        Track   Laps 7 7      Minutes 15 15      METs 2.62 2.62               Exercise Comments:   Exercise Comments     Row Name 07/18/22 1528           Exercise Comments Pt completed first day of exercise. She walked on the track for 15 min, 2.08 METs. She then exercised on the nustep for 15 min, level 1, METs 1.2. Tolerated well. Performed warmup and coold down standing with verbal cues. Discussed METs with good understanding. Will progress as tolerated.                Exercise Goals and Review:   Exercise Goals     Row Name 07/12/22 1158 07/24/22 1109           Exercise  Goals   Increase Physical Activity Yes Yes      Intervention Provide advice, education, support and counseling about physical activity/exercise needs.;Develop an individualized exercise prescription for aerobic and resistive training based on initial evaluation findings, risk stratification, comorbidities and participant's personal goals. Provide advice, education, support and counseling about physical activity/exercise needs.;Develop an individualized exercise prescription for aerobic and resistive training based on initial evaluation findings, risk stratification, comorbidities and participant's personal goals.      Expected Outcomes Short Term: Attend rehab on a regular basis to increase amount of physical activity.;Long Term: Add in home exercise to make exercise part of routine and to increase amount of physical activity.;Long Term: Exercising regularly at least 3-5 days a week. Short Term: Attend rehab on a regular basis to increase amount of physical activity.;Long Term: Add in home exercise to make exercise part of routine and to increase amount of physical activity.;Long Term: Exercising regularly at least 3-5 days a week.      Increase Strength and Stamina Yes Yes      Intervention Provide advice, education, support and counseling about physical activity/exercise needs.;Develop an individualized exercise prescription for aerobic and resistive training based on initial evaluation findings, risk stratification, comorbidities and participant's personal goals. Provide advice, education, support and counseling about physical activity/exercise needs.;Develop an individualized exercise prescription for aerobic and resistive training based on initial evaluation findings, risk stratification, comorbidities and participant's personal goals.      Expected Outcomes Short Term: Increase workloads from initial exercise prescription for resistance, speed, and METs.;Short Term: Perform resistance training exercises  routinely during rehab and add in resistance training at home;Long Term: Improve cardiorespiratory fitness, muscular endurance and strength as measured by increased METs and functional capacity (6MWT) Short Term: Increase workloads from initial exercise prescription for resistance, speed, and METs.;Short Term: Perform resistance training exercises routinely during rehab and add in resistance training at home;Long Term: Improve cardiorespiratory fitness, muscular endurance and strength as measured by increased METs and functional capacity (6MWT)      Able to understand and use rate of perceived exertion (RPE) scale Yes Yes      Intervention Provide education and explanation on how to use RPE scale Provide education and explanation on how to use RPE scale  Expected Outcomes Short Term: Able to use RPE daily in rehab to express subjective intensity level;Long Term:  Able to use RPE to guide intensity level when exercising independently Short Term: Able to use RPE daily in rehab to express subjective intensity level;Long Term:  Able to use RPE to guide intensity level when exercising independently      Able to understand and use Dyspnea scale Yes Yes      Intervention Provide education and explanation on how to use Dyspnea scale Provide education and explanation on how to use Dyspnea scale      Expected Outcomes Short Term: Able to use Dyspnea scale daily in rehab to express subjective sense of shortness of breath during exertion;Long Term: Able to use Dyspnea scale to guide intensity level when exercising independently Short Term: Able to use Dyspnea scale daily in rehab to express subjective sense of shortness of breath during exertion;Long Term: Able to use Dyspnea scale to guide intensity level when exercising independently      Knowledge and understanding of Target Heart Rate Range (THRR) Yes Yes      Intervention Provide education and explanation of THRR including how the numbers were predicted and  where they are located for reference Provide education and explanation of THRR including how the numbers were predicted and where they are located for reference      Expected Outcomes Short Term: Able to state/look up THRR;Long Term: Able to use THRR to govern intensity when exercising independently;Short Term: Able to use daily as guideline for intensity in rehab Short Term: Able to state/look up THRR;Long Term: Able to use THRR to govern intensity when exercising independently;Short Term: Able to use daily as guideline for intensity in rehab      Understanding of Exercise Prescription Yes Yes      Intervention Provide education, explanation, and written materials on patient's individual exercise prescription Provide education, explanation, and written materials on patient's individual exercise prescription      Expected Outcomes Short Term: Able to explain program exercise prescription;Long Term: Able to explain home exercise prescription to exercise independently Short Term: Able to explain program exercise prescription;Long Term: Able to explain home exercise prescription to exercise independently               Exercise Goals Re-Evaluation :  Exercise Goals Re-Evaluation     Row Name 07/24/22 1109             Exercise Goal Re-Evaluation   Exercise Goals Review Increase Physical Activity;Able to understand and use Dyspnea scale;Understanding of Exercise Prescription;Increase Strength and Stamina;Knowledge and understanding of Target Heart Rate Range (THRR);Able to understand and use rate of perceived exertion (RPE) scale       Comments Pt has completed 2 days of exercise.  Layken is undergoing chemo and therefore will miss a session every couple of weeks (will miss tomorrow). She is walking on the track for 15 min, 1.92 METs last session. She then is exercising on the nustep for 15 min, level 1, METs 1.5. Tolerated well. Performed warmup and cool down standing with verbal cues. Will progress  as tolerated.       Expected Outcomes Through exercise at rehab and home, the patient will decrease shortness of breath with daily activities and feel confident in carrying out an exercise regimen at home.                Discharge Exercise Prescription (Final Exercise Prescription Changes):  Exercise Prescription Changes - 08/01/22 1600  Response to Exercise   Blood Pressure (Admit) 120/64    Blood Pressure (Exercise) 118/60    Blood Pressure (Exit) 112/66    Heart Rate (Admit) 80 bpm    Heart Rate (Exercise) 84 bpm    Heart Rate (Exit) 91 bpm    Oxygen Saturation (Admit) 100 %    Oxygen Saturation (Exercise) 99 %    Oxygen Saturation (Exit) 99 %    Rating of Perceived Exertion (Exercise) 12    Perceived Dyspnea (Exercise) 1    Duration Continue with 30 min of aerobic exercise without signs/symptoms of physical distress.    Intensity THRR unchanged      Progression   Progression Continue to progress workloads to maintain intensity without signs/symptoms of physical distress.      Resistance Training   Training Prescription Yes    Weight red bands    Reps 10-15    Time 10 Minutes      NuStep   Level 1    SPM 60    Minutes 15    METs 1.7      Track   Laps 7    Minutes 15    METs 2.62             Nutrition:  Target Goals: Understanding of nutrition guidelines, daily intake of sodium 1500mg , cholesterol 200mg , calories 30% from fat and 7% or less from saturated fats, daily to have 5 or more servings of fruits and vegetables.  Biometrics:    Nutrition Therapy Plan and Nutrition Goals:  Nutrition Therapy & Goals - 07/18/22 1520       Nutrition Therapy   Diet General Healthy Diet      Personal Nutrition Goals   Nutrition Goal Patient to identify strategies for weight gain of 0.5-2.0# per week    Comments Wilhemina has been working on weight loss over the last several months with the oncology team; her lowest weight was ~113#. She has used supplements  in the past but does not consistently choose them now. Answered patient questions regarding nausea, eating frequency for weight gain, protein intake, adding calories from fat (avocado, nut butters, etc). Opaline will continue to benefit from participation in pulmonary rehab for nutrition, exercise, and lifestyle modification.      Intervention Plan   Intervention Prescribe, educate and counsel regarding individualized specific dietary modifications aiming towards targeted core components such as weight, hypertension, lipid management, diabetes, heart failure and other comorbidities.;Nutrition handout(s) given to patient.    Expected Outcomes Short Term Goal: Understand basic principles of dietary content, such as calories, fat, sodium, cholesterol and nutrients.;Long Term Goal: Adherence to prescribed nutrition plan.             Nutrition Assessments:  Nutrition Assessments - 07/18/22 1613       Rate Your Plate Scores   Pre Score 56            MEDIFICTS Score Key: ?70 Need to make dietary changes  40-70 Heart Healthy Diet ? 40 Therapeutic Level Cholesterol Diet  Flowsheet Row PULMONARY REHAB OTHER RESPIRATORY from 07/18/2022 in [place] [facility]  Picture Your Plate Total Score on Admission 56      Picture Your Plate Scores: <76 Unhealthy dietary pattern with much room for improvement. 41-50 Dietary pattern unlikely to meet recommendations for good health and room for improvement. 51-60 More healthful dietary pattern, with some room for improvement.  >60 Healthy dietary pattern, although there may  be some specific behaviors that could be improved.    Nutrition Goals Re-Evaluation:  Nutrition Goals Re-Evaluation     [place] Name 07/18/22 1520             Goals   Current Weight 124 lb 5.4 oz (56.4 kg)       Comment hemoglobin 9.7, hematocrit 30.4       Expected Outcome Alyssandra has been working on weight loss over the last  several months with the oncology team; her lowest weight was ~113#. She has used supplements in the past but does not consistently choose them now. She does eat out often and "grazes" throughout the day. Answered patient questions regarding nausea, eating frequency for weight gain, protein intake, adding calories from fat (avocado, nut butters, etc). Kamdyn will continue to benefit from participation in pulmonary rehab for nutrition, exercise, and lifestyle modification.                Nutrition Goals Discharge (Final Nutrition Goals Re-Evaluation):  Nutrition Goals Re-Evaluation - 07/18/22 1520       Goals   Current Weight 124 lb 5.4 oz (56.4 kg)    Comment hemoglobin 9.7, hematocrit 30.4    Expected Outcome Endia has been working on weight loss over the last several months with the oncology team; her lowest weight was ~113#. She has used supplements in the past but does not consistently choose them now. She does eat out often and "grazes" throughout the day. Answered patient questions regarding nausea, eating frequency for weight gain, protein intake, adding calories from fat (avocado, nut butters, etc). Neya will continue to benefit from participation in pulmonary rehab for nutrition, exercise, and lifestyle modification.             Psychosocial: Target Goals: Acknowledge presence or absence of significant depression and/or stress, maximize coping skills, provide positive support system. Participant is able to verbalize types and ability to use techniques and skills needed for reducing stress and depression.  Initial Review & Psychosocial Screening:  Initial Psych Review & Screening - 07/12/22 1048       Initial Review   Current issues with Current Anxiety/Panic      Family Dynamics   Good Support System? Yes    Comments Patient has a Social worker and feels anxiety has improved      Barriers   Psychosocial barriers to participate in program There are no identifiable barriers or  psychosocial needs.      Screening Interventions   Interventions Encouraged to exercise             Quality of Life Scores:  Scores of 19 and below usually indicate a poorer quality of life in these areas.  A difference of  2-3 points is a clinically meaningful difference.  A difference of 2-3 points in the total score of the Quality of Life Index has been associated with significant improvement in overall quality of life, self-image, physical symptoms, and general health in studies assessing change in quality of life.  PHQ-9: Review Flowsheet  More data exists      07/12/2022 02/02/2021 12/29/2019 10/21/2019 09/17/2019  Depression screen PHQ 2/9  Decreased Interest 0 1 1 0 0  Down, Depressed, Hopeless 0 2 1 0 0  PHQ - 2 Score 0 3 2 0 0  Altered sleeping 1 3 3  - 0  Tired, decreased energy 0 3 2 - 0  Change in appetite 0 2 0 - 0  Feeling bad or failure about yourself  1 0 0 - 0  Trouble concentrating 0 1 1 - 0  Moving slowly or fidgety/restless 0 3 0 - 0  Suicidal thoughts 0 0 0 - 0  PHQ-9 Score 2 15 8  - 0  Difficult doing work/chores Not difficult at all - - - -   Interpretation of Total Score  Total Score Depression Severity:  1-4 = Minimal depression, 5-9 = Mild depression, 10-14 = Moderate depression, 15-19 = Moderately severe depression, 20-27 = Severe depression   Psychosocial Evaluation and Intervention:  Psychosocial Evaluation - 07/12/22 1049       Psychosocial Evaluation & Interventions   Interventions Encouraged to exercise with the program and follow exercise prescription    Comments pt denies psychosocial barriers    Expected Outcomes Pt will benefit from PR without any psychosocial barriers or concerns    Continue Psychosocial Services  No Follow up required             Psychosocial Re-Evaluation:  Psychosocial Re-Evaluation     [place] Name 07/28/22 1550             Psychosocial Re-Evaluation   Current issues with Current Anxiety/Panic;Current  Psychotropic Meds;Current Sleep Concerns       Comments Corliss is currently fighting stage 4 lung cancer and getting chemotherapy every 2 weeks. She gets her therapy on Thursdays and states it takes her about 5-7 days to recuperate afterwards. She is worried about her diagnosis/health and her shortness of breath both give her anxiety. She is compliant with taking her psychotropic meds and feels like exercising is also helping. She states she has great support from her daughter. Henritta denies any needs currently, but we will continue to follow and assess.       Expected Outcomes For Bonnita Nasuti to continue PR without any psychosocial barriers or concerns.       Interventions Relaxation education       Continue Psychosocial Services  Follow up required by staff  We will continue to monitor and assess Bonnita Nasuti for any needs that may arise                Psychosocial Discharge (Final Psychosocial Re-Evaluation):  Psychosocial Re-Evaluation - 07/28/22 1550       Psychosocial Re-Evaluation   Current issues with Current Anxiety/Panic;Current Psychotropic Meds;Current Sleep Concerns    Comments Zissy is currently fighting stage 4 lung cancer and getting chemotherapy every 2 weeks. She gets her therapy on Thursdays and states it takes her about 5-7 days to recuperate afterwards. She is worried about her diagnosis/health and her shortness of breath both give her anxiety. She is compliant with taking her psychotropic meds and feels like exercising is also helping. She states she has great support from her daughter. Iniya denies any needs currently, but we will continue to follow and assess.    Expected Outcomes For Bonnita Nasuti to continue PR without any psychosocial barriers or concerns.    Interventions Relaxation education    Continue Psychosocial Services  Follow up required by staff   We will continue to monitor and assess Bonnita Nasuti for any needs that may arise            Education: Education Goals: Education  classes will be provided on a weekly basis, covering required topics. Participant will state understanding/return demonstration of topics presented.  Learning Barriers/Preferences:  Learning Barriers/Preferences - 07/12/22 1051       Learning Barriers/Preferences   Learning Barriers Sight    Learning Preferences Skilled Demonstration;Pictoral;Written Material  Education Topics: Introduction to Pulmonary Rehab Group instruction provided by PowerPoint, verbal discussion, and written material to support subject matter. Instructor reviews what Pulmonary Rehab is, the purpose of the program, and how patients are referred.     Know Your Numbers Group instruction that is supported by a PowerPoint presentation. Instructor discusses importance of knowing and understanding resting, exercise, and post-exercise oxygen saturation, heart rate, and blood pressure. Oxygen saturation, heart rate, blood pressure, rating of perceived exertion, and dyspnea are reviewed along with a normal range for these values.    Exercise for the Pulmonary Patient Group instruction that is supported by a PowerPoint presentation. Instructor discusses benefits of exercise, core components of exercise, frequency, duration, and intensity of an exercise routine, importance of utilizing pulse oximetry during exercise, safety while exercising, and options of places to exercise outside of rehab.    MET Level  Group instruction provided by PowerPoint, verbal discussion, and written material to support subject matter. Instructor reviews what METs are and how to increase METs.  Flowsheet Row PULMONARY REHAB OTHER RESPIRATORY from 07/18/2022 in [facility] [place]  Date 07/18/22  Educator EP  Instruction Review Code 1- Verbalizes Understanding       Pulmonary Medications Verbally interactive group education provided by instructor with focus on inhaled medications  and proper administration.   Anatomy and Physiology of the Respiratory System Group instruction provided by PowerPoint, verbal discussion, and written material to support subject matter. Instructor reviews respiratory cycle and anatomical components of the respiratory system and their functions. Instructor also reviews differences in obstructive and restrictive respiratory diseases with examples of each.    Oxygen Safety Group instruction provided by PowerPoint, verbal discussion, and written material to support subject matter. There is an overview of "What is Oxygen" and "Why do we need it".  Instructor also reviews how to create a safe environment for oxygen use, the importance of using oxygen as prescribed, and the risks of noncompliance. There is a brief discussion on traveling with oxygen and resources the patient may utilize.   Oxygen Use Group instruction provided by PowerPoint, verbal discussion, and written material to discuss how supplemental oxygen is prescribed and different types of oxygen supply systems. Resources for more information are provided.  Flowsheet Row PULMONARY REHAB OTHER RESPIRATORY from 07/20/2022 in [facility]  Date 07/20/22  Educator RT  Instruction Review Code 1- Verbalizes Understanding       Breathing Techniques Group instruction that is supported by demonstration and informational handouts. Instructor discusses the benefits of pursed lip and diaphragmatic breathing and detailed demonstration on how to perform both.  Flowsheet Row PULMONARY REHAB OTHER RESPIRATORY from 07/27/2022 in [facility]  Date 07/27/22  Educator EP  Instruction Review Code 1- Verbalizes Understanding        Risk Factor Reduction Group instruction that is supported by a PowerPoint presentation. Instructor discusses the definition of a risk factor, different risk  factors for pulmonary disease, and how the heart and lungs work together.   MD Day A group question and answer session with a medical doctor that allows participants to ask questions that relate to their pulmonary disease state.   Nutrition for the Pulmonary Patient Group instruction provided by PowerPoint slides, verbal discussion, and written materials to support subject matter. The instructor gives an explanation and review of healthy diet recommendations, which includes a  discussion on weight management, recommendations for fruit and vegetable consumption, as well as protein, fluid, caffeine, fiber, sodium, sugar, and alcohol. Tips for eating when patients are short of breath are discussed.    Other Education Group or individual verbal, written, or video instructions that support the educational goals of the pulmonary rehab program.    Knowledge Questionnaire Score:   Core Components/Risk Factors/Patient Goals at Admission:  Personal Goals and Risk Factors at Admission - 07/12/22 1051       Core Components/Risk Factors/Patient Goals on Admission    Weight Management Yes;Weight Gain    Intervention Weight Management: Develop a combined nutrition and exercise program designed to reach desired caloric intake, while maintaining appropriate intake of nutrient and fiber, sodium and fats, and appropriate energy expenditure required for the weight goal.;Weight Management: Provide education and appropriate resources to help participant work on and attain dietary goals.    Expected Outcomes Short Term: Continue to assess and modify interventions until short term weight is achieved;Long Term: Adherence to nutrition and physical activity/exercise program aimed toward attainment of established weight goal;Weight Maintenance: Understanding of the daily nutrition guidelines, which includes 25-35% calories from fat, 7% or less cal from saturated fats, less than 200mg  cholesterol, less than 1.5gm of  sodium, & 5 or more servings of fruits and vegetables daily;Understanding recommendations for meals to include 15-35% energy as protein, 25-35% energy from fat, 35-60% energy from carbohydrates, less than 200mg  of dietary cholesterol, 20-35 gm of total fiber daily;Understanding of distribution of calorie intake throughout the day with the consumption of 4-5 meals/snacks;Weight Gain: Understanding of general recommendations for a high calorie, high protein meal plan that promotes weight gain by distributing calorie intake throughout the day with the consumption for 4-5 meals, snacks, and/or supplements    Improve shortness of breath with ADL's Yes    Intervention Provide education, individualized exercise plan and daily activity instruction to help decrease symptoms of SOB with activities of daily living.    Expected Outcomes Short Term: Improve cardiorespiratory fitness to achieve a reduction of symptoms when performing ADLs;Long Term: Be able to perform more ADLs without symptoms or delay the onset of symptoms    Increase knowledge of respiratory medications and ability to use respiratory devices properly  Yes    Intervention Provide education and demonstration as needed of appropriate use of medications, inhalers, and oxygen therapy.    Expected Outcomes Short Term: Achieves understanding of medications use. Understands that oxygen is a medication prescribed by physician. Demonstrates appropriate use of inhaler and oxygen therapy.;Long Term: Maintain appropriate use of medications, inhalers, and oxygen therapy.             Core Components/Risk Factors/Patient Goals Review:   Goals and Risk Factor Review     Row Name 07/28/22 1556             Core Components/Risk Factors/Patient Goals Review   Personal Goals Review Other;Stress;Increase knowledge of respiratory medications and ability to use respiratory devices properly.;Develop more efficient breathing techniques such as purse lipped  breathing and diaphragmatic breathing and practicing self-pacing with activity.;Improve shortness of breath with ADL's  Weight Gain       Review Loralyn is very motivated when she attends PR class. She has a positive personality and really enjoys coming to class to build up her endurance and stamina. She is currently walking the track and exercising on the NuStep. She is increasing her workload and METs on the NuStep. Lalana can report her rate of perceived  exertion and her dyspnea level to staff. She has attended the breathing techniques class. She states that she can already feel a difference.       Expected Outcomes See Admission Goals                Core Components/Risk Factors/Patient Goals at Discharge (Final Review):   Goals and Risk Factor Review - 07/28/22 1556       Core Components/Risk Factors/Patient Goals Review   Personal Goals Review Other;Stress;Increase knowledge of respiratory medications and ability to use respiratory devices properly.;Develop more efficient breathing techniques such as purse lipped breathing and diaphragmatic breathing and practicing self-pacing with activity.;Improve shortness of breath with ADL's   Weight Gain   Review Marien is very motivated when she attends PR class. She has a positive personality and really enjoys coming to class to build up her endurance and stamina. She is currently walking the track and exercising on the NuStep. She is increasing her workload and METs on the NuStep. Deaunna can report her rate of perceived exertion and her dyspnea level to staff. She has attended the breathing techniques class. She states that she can already feel a difference.    Expected Outcomes See Admission Goals             ITP Comments:   Comments: Dr. Rodman Pickle is Medical Director for Pulmonary Rehab at [facility].

## 2022-08-03 ENCOUNTER — Encounter (HOSPITAL_COMMUNITY)
Admission: RE | Admit: 2022-08-03 | Discharge: 2022-08-03 | Disposition: A | Discharge status: Home / Self Care | Payer: Medicare HMO | Source: Ambulatory Visit | Attending: Pulmonary Disease | Admitting: Pulmonary Disease

## 2022-08-03 DIAGNOSIS — J479 Bronchiectasis, uncomplicated: Secondary | ICD-10-CM | POA: Diagnosis not present

## 2022-08-03 NOTE — Progress Notes (Signed)
Allison [name], counseling intern, met with patient for their scheduled counseling session.   Patient reported they are continuing to feel better and they are experiencing less panic attacks.   The patient reported they put up boundaries with caregivers who checked on them frequently. The patient reflected they are beginning to move at their own pace.   The patient scheduled their next counseling session for Monday, March 4th at [place],  Counseling Intern  [email or web address]

## 2022-08-03 NOTE — Progress Notes (Signed)
Home Exercise Prescription I have reviewed a Home Exercise Prescription with [name]. She is currently doing a 15 min virtual walking program most days of the week. Encouraged her to add walking outside 15 min+ when the weather allows to work to 30 min. The patient stated that their goals were to build her balance and confidence to walk without a cane. We reviewed exercise guidelines, target heart rate during exercise, RPE Scale, weather conditions, endpoints for exercise, warmup and cool down. The patient is encouraged to come to me with any questions. I will continue to follow up with the patient to assist them with progression and safety.    Kyel Purk [place], [place], ACSM-CEP 08/03/2022 4:42 PM

## 2022-08-03 NOTE — Progress Notes (Signed)
Daily Session Note  Patient Details  Name: Allison [name] MRN: [id number] Date of Birth: June 21, 1950 Referring Provider:   April Manson Pulmonary Rehab Walk Test from 07/12/2022 in [facility] [place]  Referring Provider Icard       Encounter Date: 08/03/2022  Check In:  Session Check In - 08/03/22 1425       Check-In   Supervising physician immediately available to respond to emergencies [facility] MD immediately available    Physician(s) Erin Hearing, NP    Location [facility]    Staff Present Janine Ores, RN, Quentin Ore, MS, ACSM-CEP, Exercise Physiologist;Carlette Wilber Oliphant, RN, BSN;Randi Reeve BS, ACSM-CEP, Exercise Physiologist;Samantha [place], RD, LDN;Other    Virtual Visit No    Medication changes reported     No    Fall or balance concerns reported    No    Tobacco Cessation No Change    Warm-up and Cool-down Performed as group-led instruction    Resistance Training Performed Yes    VAD Patient? No    PAD/SET Patient? No      Pain Assessment   Currently in Pain? No/denies    Multiple Pain Sites No             Capillary Blood Glucose: No results found. However, due to the size of the patient record, not all encounters were searched. Please check Results Review for a complete set of results.    Social History   Tobacco Use  Smoking Status Never  Smokeless Tobacco Never    Goals Met:  Proper associated with RPD/PD & O2 Sat Independence with exercise equipment Exercise tolerated well No report of concerns or symptoms today Strength training completed today  Goals Unmet:  Not Applicable  Comments: Service time is from 1322 to 1444.    Dr. Rodman Pickle is Medical Director for Pulmonary Rehab at [facility].

## 2022-08-04 ENCOUNTER — Other Ambulatory Visit: Payer: Self-pay | Admitting: Nurse Practitioner

## 2022-08-04 DIAGNOSIS — E876 Hypokalemia: Secondary | ICD-10-CM

## 2022-08-04 DIAGNOSIS — C3491 Malignant neoplasm of unspecified part of right bronchus or lung: Secondary | ICD-10-CM

## 2022-08-08 ENCOUNTER — Encounter (HOSPITAL_COMMUNITY)
Admission: RE | Admit: 2022-08-08 | Discharge: 2022-08-08 | Disposition: A | Discharge status: Home / Self Care | Payer: Medicare HMO | Source: Ambulatory Visit | Attending: Pulmonary Disease | Admitting: Pulmonary Disease

## 2022-08-08 DIAGNOSIS — J479 Bronchiectasis, uncomplicated: Secondary | ICD-10-CM

## 2022-08-08 NOTE — Progress Notes (Signed)
Daily Session Note  Patient Details  Name: Allison [name] MRN: [id number] Date of Birth: 1950/08/23 Referring Provider:   April Manson Pulmonary Rehab Walk Test from 07/12/2022 in [facility] [place]  Referring Provider Icard       Encounter Date: 08/08/2022  Check In:  Session Check In - 08/08/22 [place]       Check-In   Supervising physician immediately available to respond to emergencies [facility] MD immediately available    Physician(s) Eric Form, NP    Location [facility]    Staff Present Janine Ores, RN, Quentin Ore, MS, ACSM-CEP, Exercise Physiologist;Carlette Wilber Oliphant, RN, BSN;Randi Reeve BS, ACSM-CEP, Exercise Physiologist;Samantha [place], RD, LDN;Other    Virtual Visit No    Medication changes reported     No    Fall or balance concerns reported    No    Tobacco Cessation No Change    Warm-up and Cool-down Performed as group-led instruction    Resistance Training Performed Yes    VAD Patient? No    PAD/SET Patient? No      Pain Assessment   Currently in Pain? No/denies    Multiple Pain Sites No             Capillary Blood Glucose: No results found. However, due to the size of the patient record, not all encounters were searched. Please check Results Review for a complete set of results.    Social History   Tobacco Use  Smoking Status Never  Smokeless Tobacco Never    Goals Met:  Proper associated with RPD/PD & O2 Sat Independence with exercise equipment Exercise tolerated well No report of concerns or symptoms today Strength training completed today  Goals Unmet:  Not Applicable  Comments: Service time is from 1315 to 1440.    Dr. Rodman Pickle is Medical Director for Pulmonary Rehab at [facility].

## 2022-08-09 NOTE — Progress Notes (Unsigned)
[place]  [phone] [phone]   Name: Allison [name] Date: 08/09/2022 MRN: [id number]  DOB: 01/04/1951  Patient Care Team: [name], MD as PCP - General (Internal Medicine) Donato Heinz, MD as PCP - Cardiology (Cardiology) [name], MD as Consulting Physician (Radiation Oncology) Pickenpack-Cousar, Carlena Sax, NP as Nurse Practitioner (Nurse Practitioner) Valrie Hart, RN as Oncology Nurse Navigator (Oncology) Curt Bears, MD as Consulting Physician (Oncology) Gayland Curry, DO (Geriatric Medicine)   INTERVAL HISTORY: Allison [name] is a 72 y.o. female with stage IV non-small cell lung cancer (12/2017), hypertension, CVA, CAD, DVT/PE (on Lovenox), and GERD.  Palliative ask to see for symptom management and goals of care.  SOCIAL HISTORY:     reports that she has never smoked. She has never used smokeless tobacco. She reports that she does not currently use alcohol. She reports that she does not use drugs.  ADVANCE DIRECTIVES:  None on file   CODE STATUS: DNR  PAST MEDICAL HISTORY: Past Medical History:  Diagnosis Date   Anemia    Anxiety    Arthritis    Asthma    exercise induced   Coronary artery disease    Depression    PMH   Dyspnea    GERD (gastroesophageal reflux disease)    Glaucoma    History of radiation therapy 01/05/2021   IMRT right lung  11/24/2020-01/05/2021  Dr [name]   Hypertension    lung ca dx'd 11/2017   right   Malignant pleural effusion    right   Nuclear sclerotic cataract of right eye 02/28/2021   Dr. Warden Fillers, cataract surgery February 2023   PONV (postoperative nausea and vomiting)    Pre-diabetes    Raynaud's disease    Raynaud's disease    Stroke ([place]) 01/2021   balance off, some express aphasia, weakness    ALLERGIES:  is allergic to hydrocodone-acetaminophen, penicillins, lactose, vicodin [hydrocodone-acetaminophen], and  other.  MEDICATIONS:  Current Outpatient Medications  Medication Sig Dispense Refill   acetaminophen (TYLENOL) 500 MG tablet Take 1,000 mg by mouth every 6 (six) hours as needed (pain).     ALPRAZolam (XANAX) 0.25 MG tablet Take 1 tablet (0.25 mg total) by mouth 2 (two) times daily as needed for anxiety. May take 2 tablets (0.5 mg total) at bedtime. 60 tablet 0   amLODipine (NORVASC) 5 MG tablet Take 1.5 tablets (7.5 mg total) by mouth daily.     brompheniramine-pseudoephedrine-DM 30-2-10 MG/5ML syrup Take 5 mLs by mouth 3 (three) times daily as needed. 120 mL 0   carvedilol (COREG) 25 MG tablet Take 0.5 tablets (12.5 mg total) by mouth 2 (two) times daily.     cetirizine (ZYRTEC) 5 MG tablet Take 5 mg by mouth daily. (Patient not taking: Reported on 07/12/2022)     dexamethasone (DECADRON) 4 MG tablet TAKE 1 TABLET TWICE A DAY THE DAY BEFORE, THE DAY OF, AND THE DAY AFTER CHEMOTHERAPY 40 tablet 2   docusate sodium (COLACE) 100 MG capsule Take 1 capsule (100 mg total) by mouth every other day. 15 capsule 5   dorzolamide-timolol (COSOPT) 22.3-6.8 MG/ML ophthalmic solution Place 1 drop into both eyes 2 (two) times daily.     dronabinol (MARINOL) 2.5 MG capsule Take 1 capsule (2.5 mg total) by mouth 2 (two) times daily as needed (decreased appetite). 60 capsule 0   enoxaparin (LOVENOX) 80 MG/0.8ML injection Inject 80 mg into the skin daily.  esomeprazole (NEXIUM) 20 MG capsule Take 1 capsule (20 mg total) by mouth in the morning and at bedtime. (Patient not taking: Reported on 07/12/2022) 60 capsule 2   ezetimibe (ZETIA) 10 MG tablet Take 10 mg by mouth daily.     FeFum-FePoly-FA-B Cmp-C-Biot (FOLIVANE-PLUS) CAPS TAKE 1 CAPSULE BY MOUTH EVERY DAY IN THE MORNING 90 capsule 0   FLUoxetine (PROZAC) 20 MG capsule Take 1 capsule (20 mg total) by mouth daily. 90 capsule 3   folic acid (FOLVITE) 1 MG tablet Take 1 tablet (1 mg total) by mouth daily. 90 tablet 1   hyoscyamine (LEVSIN SL) 0.125 MG SL  tablet PLACE 1-2 TABLETS UNDER THE TONGUE EVERY 6 HOURS AS NEEDED FOR CRAMPING 30 tablet 0   ipratropium-albuterol (DUONEB) 0.5-2.5 (3) MG/3ML SOLN Take 3 mLs by nebulization every 6 (six) hours as needed (Asthma).     isosorbide mononitrate (IMDUR) 30 MG 24 hr tablet TAKE 1 TABLET BY MOUTH EVERY DAY 90 tablet 3   latanoprost (XALATAN) 0.005 % ophthalmic solution Place 1 drop into both eyes at bedtime.      ondansetron (ZOFRAN-ODT) 4 MG disintegrating tablet Take 1 tablet (4 mg total) by mouth every 8 (eight) hours as needed for nausea or vomiting. 30 tablet 1   osimertinib mesylate (TAGRISSO) 80 MG tablet Take 1 tablet (80 mg total) by mouth daily. 30 tablet 4   potassium chloride SA (KLOR-CON M) 20 MEQ tablet Take 1 tablet (20 mEq total) by mouth daily. 7 tablet 0   prochlorperazine (COMPAZINE) 10 MG tablet Take 1 tablet (10 mg total) by mouth every 6 (six) hours as needed. 30 tablet 2   REPATHA SURECLICK 765 MG/ML SOAJ INJECT 140 MG INTO THE SKIN EVERY 14 (FOURTEEN) DAYS. 6 mL 3   valACYclovir (VALTREX) 500 MG tablet      No current facility-administered medications for this visit.    VITAL SIGNS: LMP  (LMP Unknown)  There were no vitals filed for this visit.   Estimated body mass index is 21.42 kg/m as calculated from the following:   Height as of 07/12/22: 5\' 4"  (1.626 m).   Weight as of 08/01/22: 124 lb 12.5 oz (56.6 kg).   PERFORMANCE STATUS (ECOG) : 1 - Symptomatic but completely ambulatory   IMPRESSION:  I saw Allison [name] during her infusion. No acute distress. Is remaining as active as possible. Denies constipation. Endorses ongoing nausea. Sleeping is improved with as needed use of Xanax.   Allison [name] is complaining of ongoing nausea. Receiving treatment and anti-emetics. Denies pain or shortness of breath. Appetite improving. Weight is up 127lbs from 124lbs on 2/13. Some occasional diarrhea. Controlled with hyoscyamine.   No symptom management needs at this time. We will  continue to support and follow as needed.   PLAN: Symptoms are well controlled. Appetite is much improved. She is being followed by the dietician.  Nexium twice daily for indigestion. Tolerating well.  Anxiety well controlled. Occasional use of Xanax at bedtime.   Zofran as needed for nausea. Hyoscyamine for cramping.  I will plan to see back in 6-8 weeks in collaboration with her other oncology appointments.    Patient expressed understanding and was in agreement with this plan. She also understands that She can call the clinic at any time with any questions, concerns, or complaints.     Time Total: 15 min   Visit consisted of counseling and education dealing with the complex and emotionally intense issues of symptom management and palliative care in  the setting of serious and potentially life-threatening illness.Greater than 50%  of this time was spent counseling and coordinating care related to the above assessment and plan.  Alda Lea, AGPCNP-BC  Palliative Medicine Team/[facility]

## 2022-08-10 ENCOUNTER — Other Ambulatory Visit: Payer: Self-pay | Admitting: Physician Assistant

## 2022-08-10 ENCOUNTER — Encounter: Payer: Self-pay | Admitting: Nurse Practitioner

## 2022-08-10 ENCOUNTER — Ambulatory Visit: Payer: Medicare HMO | Admitting: Physician Assistant

## 2022-08-10 ENCOUNTER — Other Ambulatory Visit: Payer: Medicare HMO

## 2022-08-10 ENCOUNTER — Encounter: Payer: Self-pay | Admitting: Medical Oncology

## 2022-08-10 ENCOUNTER — Encounter (HOSPITAL_COMMUNITY)
Admission: RE | Admit: 2022-08-10 | Discharge: 2022-08-10 | Disposition: A | Discharge status: Home / Self Care | Payer: Medicare HMO | Source: Ambulatory Visit | Attending: Pulmonary Disease | Admitting: Pulmonary Disease

## 2022-08-10 ENCOUNTER — Inpatient Hospital Stay (HOSPITAL_BASED_OUTPATIENT_CLINIC_OR_DEPARTMENT_OTHER): Payer: Medicare HMO | Admitting: Nurse Practitioner

## 2022-08-10 ENCOUNTER — Inpatient Hospital Stay (HOSPITAL_BASED_OUTPATIENT_CLINIC_OR_DEPARTMENT_OTHER): Payer: Medicare HMO | Admitting: Internal Medicine

## 2022-08-10 ENCOUNTER — Inpatient Hospital Stay: Payer: Medicare HMO

## 2022-08-10 ENCOUNTER — Ambulatory Visit: Payer: Medicare HMO

## 2022-08-10 VITALS — BP 158/68 | HR 75 | Temp 99.7°F | Resp 16 | Wt 127.0 lb

## 2022-08-10 VITALS — BP 127/63 | HR 74 | Resp 16

## 2022-08-10 VITALS — Wt 126.8 lb

## 2022-08-10 DIAGNOSIS — Z515 Encounter for palliative care: Secondary | ICD-10-CM | POA: Diagnosis not present

## 2022-08-10 DIAGNOSIS — R112 Nausea with vomiting, unspecified: Secondary | ICD-10-CM

## 2022-08-10 DIAGNOSIS — R110 Nausea: Secondary | ICD-10-CM | POA: Diagnosis not present

## 2022-08-10 DIAGNOSIS — C3490 Malignant neoplasm of unspecified part of unspecified bronchus or lung: Secondary | ICD-10-CM | POA: Diagnosis not present

## 2022-08-10 DIAGNOSIS — J479 Bronchiectasis, uncomplicated: Secondary | ICD-10-CM | POA: Diagnosis not present

## 2022-08-10 DIAGNOSIS — R197 Diarrhea, unspecified: Secondary | ICD-10-CM

## 2022-08-10 DIAGNOSIS — C3491 Malignant neoplasm of unspecified part of right bronchus or lung: Secondary | ICD-10-CM

## 2022-08-10 DIAGNOSIS — R530 Neoplastic (malignant) related fatigue: Secondary | ICD-10-CM | POA: Diagnosis not present

## 2022-08-10 DIAGNOSIS — Z5111 Encounter for antineoplastic chemotherapy: Secondary | ICD-10-CM | POA: Diagnosis not present

## 2022-08-10 DIAGNOSIS — Z95828 Presence of other vascular implants and grafts: Secondary | ICD-10-CM

## 2022-08-10 LAB — CBC WITH DIFFERENTIAL (CANCER CENTER ONLY)
Abs Immature Granulocytes: 0.01 10*3/uL (ref 0.00–0.07)
Basophils Absolute: 0 10*3/uL (ref 0.0–0.1)
Basophils Relative: 0 %
Eosinophils Absolute: 0 10*3/uL (ref 0.0–0.5)
Eosinophils Relative: 0 %
HCT: 31.8 % — ABNORMAL LOW (ref 36.0–46.0)
Hemoglobin: 10.6 g/dL — ABNORMAL LOW (ref 12.0–15.0)
Immature Granulocytes: 0 %
Lymphocytes Relative: 18 %
Lymphs Abs: 0.7 10*3/uL (ref 0.7–4.0)
MCH: 29 pg (ref 26.0–34.0)
MCHC: 33.3 g/dL (ref 30.0–36.0)
MCV: 86.9 fL (ref 80.0–100.0)
Monocytes Absolute: 0.3 10*3/uL (ref 0.1–1.0)
Monocytes Relative: 7 %
Neutro Abs: 3 10*3/uL (ref 1.7–7.7)
Neutrophils Relative %: 75 %
Platelet Count: 197 10*3/uL (ref 150–400)
RBC: 3.66 MIL/uL — ABNORMAL LOW (ref 3.87–5.11)
RDW: 14.5 % (ref 11.5–15.5)
WBC Count: 4 10*3/uL (ref 4.0–10.5)
nRBC: 0 % (ref 0.0–0.2)

## 2022-08-10 LAB — CMP (CANCER CENTER ONLY)
ALT: 39 U/L (ref 0–44)
AST: 46 U/L — ABNORMAL HIGH (ref 15–41)
Albumin: 3.8 g/dL (ref 3.5–5.0)
Alkaline Phosphatase: 73 U/L (ref 38–126)
Anion gap: 9 (ref 5–15)
BUN: 27 mg/dL — ABNORMAL HIGH (ref 8–23)
CO2: 24 mmol/L (ref 22–32)
Calcium: 9.1 mg/dL (ref 8.9–10.3)
Chloride: 106 mmol/L (ref 98–111)
Creatinine: 1.12 mg/dL — ABNORMAL HIGH (ref 0.44–1.00)
GFR, Estimated: 53 mL/min — ABNORMAL LOW (ref >60)
Glucose, Bld: 150 mg/dL — ABNORMAL HIGH (ref 70–99)
Potassium: 3.5 mmol/L (ref 3.5–5.1)
Sodium: 139 mmol/L (ref 135–145)
Total Bilirubin: 0.2 mg/dL — ABNORMAL LOW (ref 0.3–1.2)
Total Protein: 6.9 g/dL (ref 6.5–8.1)

## 2022-08-10 MED ORDER — PROCHLORPERAZINE MALEATE 10 MG PO TABS: 10.0000 mg | ORAL_TABLET | Freq: Four times a day (QID) | ORAL | Status: DC | PRN

## 2022-08-10 MED ORDER — ONDANSETRON HCL 4 MG/2ML IJ SOLN
8.0000 mg | Freq: Once | INTRAMUSCULAR | Status: AC
  Administered 2022-08-10: 8 mg via INTRAVENOUS
  Filled 2022-08-10: qty 4

## 2022-08-10 MED ORDER — SODIUM CHLORIDE 0.9% FLUSH
10.0000 mL | Freq: Once | INTRAVENOUS | Status: AC
  Administered 2022-08-10: 10 mL

## 2022-08-10 MED ORDER — SODIUM CHLORIDE 0.9 % IV SOLN
400.0000 mg/m2 | Freq: Once | INTRAVENOUS | Status: AC
  Administered 2022-08-10: 600 mg via INTRAVENOUS
  Filled 2022-08-10: qty 20

## 2022-08-10 MED ORDER — PROMETHAZINE HCL 12.5 MG PO TABS
12.5000 mg | ORAL_TABLET | Freq: Four times a day (QID) | ORAL | 2 refills | Status: DC | PRN
  Filled 2022-11-24: qty 30, 8d supply, fill #0

## 2022-08-10 MED ORDER — HEPARIN SOD (PORK) LOCK FLUSH 100 UNIT/ML IV SOLN
500.0000 [IU] | Freq: Once | INTRAVENOUS | Status: AC | PRN
  Administered 2022-08-10: 500 [IU]

## 2022-08-10 MED ORDER — SODIUM CHLORIDE 0.9% FLUSH
10.0000 mL | INTRAVENOUS | Status: DC | PRN
  Administered 2022-08-10: 10 mL

## 2022-08-10 MED ORDER — SODIUM CHLORIDE 0.9 % IV SOLN: Freq: Once | INTRAVENOUS | Status: AC

## 2022-08-10 NOTE — Progress Notes (Signed)
Daily Session Note  Patient Details  Name: Allison [name] MRN: [id number] Date of Birth: February 26, 1951 Referring Provider:   April Manson Pulmonary Rehab Walk Test from 07/12/2022 in [facility]  Referring Provider Icard       Encounter Date: 08/10/2022  Check In:  Session Check In - 08/10/22 1434       Check-In   Supervising physician immediately available to respond to emergencies [facility] MD immediately available    Physician(s) [place]    Location [facility]    Staff Present Janine Ores, RN, Quentin Ore, MS, ACSM-CEP, Exercise Physiologist;Randi Olen Cordial BS, ACSM-CEP, Exercise Physiologist;Samantha [place], RD, LDN;Other    Virtual Visit No    Medication changes reported     No    Fall or balance concerns reported    No    Tobacco Cessation No Change    Warm-up and Cool-down Performed as group-led instruction    Resistance Training Performed Yes    VAD Patient? No    PAD/SET Patient? No      Pain Assessment   Currently in Pain? No/denies    Pain Score 0-No pain    Multiple Pain Sites No             Capillary Blood Glucose: Results for orders placed or performed in visit on 08/10/22 (from the past 24 hour(s))  CBC with Differential ([place] Only)     Status: Abnormal   Collection Time: 08/10/22  8:58 AM  Result Value Ref Range   WBC Count 4.0 4.0 - 10.5 K/uL   RBC 3.66 (L) 3.87 - 5.11 MIL/uL   Hemoglobin 10.6 (L) 12.0 - 15.0 g/dL   HCT 31.8 (L) 36.0 - 46.0 %   MCV 86.9 80.0 - 100.0 fL   MCH 29.0 26.0 - 34.0 pg   MCHC 33.3 30.0 - 36.0 g/dL   RDW 14.5 11.5 - 15.5 %   Platelet Count 197 150 - 400 K/uL   nRBC 0.0 0.0 - 0.2 %   Neutrophils Relative % 75 %   Neutro Abs 3.0 1.7 - 7.7 K/uL   Lymphocytes Relative 18 %   Lymphs Abs 0.7 0.7 - 4.0 K/uL   Monocytes Relative 7 %   Monocytes Absolute 0.3 0.1 - 1.0 K/uL   Eosinophils Relative 0 %   Eosinophils Absolute 0.0 0.0 - 0.5 K/uL    Basophils Relative 0 %   Basophils Absolute 0.0 0.0 - 0.1 K/uL   Immature Granulocytes 0 %   Abs Immature Granulocytes 0.01 0.00 - 0.07 K/uL  CMP ([facility] only)     Status: Abnormal   Collection Time: 08/10/22  8:58 AM  Result Value Ref Range   Sodium 139 135 - 145 mmol/L   Potassium 3.5 3.5 - 5.1 mmol/L   Chloride 106 98 - 111 mmol/L   CO2 24 22 - 32 mmol/L   Glucose, Bld 150 (H) 70 - 99 mg/dL   BUN 27 (H) 8 - 23 mg/dL   Creatinine 1.12 (H) 0.44 - 1.00 mg/dL   Calcium 9.1 8.9 - 10.3 mg/dL   Total Protein 6.9 6.5 - 8.1 g/dL   Albumin 3.8 3.5 - 5.0 g/dL   AST 46 (H) 15 - 41 U/L   ALT 39 0 - 44 U/L   Alkaline Phosphatase 73 38 - 126 U/L   Total Bilirubin 0.2 (L) 0.3 - 1.2 mg/dL   GFR, Estimated 53 (L) >60 mL/min  Anion gap 9 5 - 15   *Note: Due to a large number of results and/or encounters for the requested time period, some results have not been displayed. A complete set of results can be found in Results Review.      Social History   Tobacco Use  Smoking Status Never  Smokeless Tobacco Never    Goals Met:  Proper associated with RPD/PD & O2 Sat Independence with exercise equipment Exercise tolerated well No report of concerns or symptoms today Strength training completed today  Goals Unmet:  Not Applicable  Comments: Service time is from 1319 to 1448.    Dr. Rodman Pickle is Medical Director for Pulmonary Rehab at [facility].

## 2022-08-10 NOTE — Patient Instructions (Signed)
[place]  Discharge Instructions: Thank you for choosing [facility] to provide your oncology and hematology care.   If you have a lab appointment with the [facility], please go directly to the [facility] and check in at the registration area.   Wear comfortable clothing and clothing appropriate for easy access to any Portacath or PICC line.   We strive to give you quality time with your provider. You may need to reschedule your appointment if you arrive late (15 or more minutes).  Arriving late affects you and other patients whose appointments are after yours.  Also, if you miss three or more appointments without notifying the office, you may be dismissed from the clinic at the provider's discretion.      For prescription refill requests, have your pharmacy contact our office and allow 72 hours for refills to be completed.    Today you received the following chemotherapy and/or immunotherapy agent: Pemetrexed (Alimta)   To help prevent nausea and vomiting after your treatment, we encourage you to take your nausea medication as directed.  BELOW ARE SYMPTOMS THAT SHOULD BE REPORTED IMMEDIATELY: *FEVER GREATER THAN 100.4 F (38 C) OR HIGHER *CHILLS OR SWEATING *NAUSEA AND VOMITING THAT IS NOT CONTROLLED WITH YOUR NAUSEA MEDICATION *UNUSUAL SHORTNESS OF BREATH *UNUSUAL BRUISING OR BLEEDING *URINARY PROBLEMS (pain or burning when urinating, or frequent urination) *BOWEL PROBLEMS (unusual diarrhea, constipation, pain near the anus) TENDERNESS IN MOUTH AND THROAT WITH OR WITHOUT PRESENCE OF ULCERS (sore throat, sores in mouth, or a toothache) UNUSUAL RASH, SWELLING OR PAIN  UNUSUAL VAGINAL DISCHARGE OR ITCHING   Items with * indicate a potential emergency and should be followed up as soon as possible or go to the Emergency Department if any problems should occur.  Please show the CHEMOTHERAPY ALERT CARD or IMMUNOTHERAPY ALERT CARD at  check-in to the Emergency Department and triage nurse.  Should you have questions after your visit or need to cancel or reschedule your appointment, please contact [facility]  Dept: [phone]  and follow the prompts.  Office hours are 8:00 a.m. to 4:30 p.m. Monday - Friday. Please note that voicemails left after 4:00 p.m. may not be returned until the following business day.  We are closed weekends and major holidays. You have access to a nurse at all times for urgent questions. Please call the main number to the clinic Dept: [phone] and follow the prompts.   For any non-urgent questions, you may also contact your provider using MyChart. We now offer e-Visits for anyone 66 and older to request care online for non-urgent symptoms. For details visit [email or web address].   Also download the MyChart app! Go to the app store, search "MyChart", open the app, select [facility], and log in with your MyChart username and password.  Pemetrexed Injection What is this medication? PEMETREXED (PEM e TREX ed) treats some types of cancer. It works by slowing down the growth of cancer cells. This medicine may be used for other purposes; ask your health care provider or pharmacist if you have questions. COMMON BRAND NAME(S): Alimta, PEMFEXY What should I tell my care team before I take this medication? They need to know if you have any of these conditions: Infection, such as chickenpox, cold sores, or herpes Kidney disease Low blood cell levels (white cells, red cells, and platelets) Lung or breathing disease, such as asthma Radiation therapy An unusual or allergic reaction to  pemetrexed, other medications, foods, dyes, or preservatives If you or your partner are pregnant or trying to get pregnant Breast-feeding How should I use this medication? This medication is injected into a vein. It is given by your care team in a hospital or clinic setting. Talk to  your care team about the use of this medication in children. Special care may be needed. Overdosage: If you think you have taken too much of this medicine contact a poison control center or emergency room at once. NOTE: This medicine is only for you. Do not share this medicine with others. What if I miss a dose? Keep appointments for follow-up doses. It is important not to miss your dose. Call your care team if you are unable to keep an appointment. What may interact with this medication? Do not take this medication with any of the following: Live virus vaccines This medication may also interact with the following: Ibuprofen This list may not describe all possible interactions. Give your health care provider a list of all the medicines, herbs, non-prescription drugs, or dietary supplements you use. Also tell them if you smoke, drink alcohol, or use illegal drugs. Some items may interact with your medicine. What should I watch for while using this medication? Your condition will be monitored carefully while you are receiving this medication. This medication may make you feel generally unwell. This is not uncommon as chemotherapy can affect healthy cells as well as cancer cells. Report any side effects. Continue your course of treatment even though you feel ill unless your care team tells you to stop. This medication can cause serious side effects. To reduce the risk, your care team may give you other medications to take before receiving this one. Be sure to follow the directions from your care team. This medication can cause a rash or redness in areas of the body that have previously had radiation therapy. If you have had radiation therapy, tell your care team if you notice a rash in this area. This medication may increase your risk of getting an infection. Call your care team for advice if you get a fever, chills, sore throat, or other symptoms of a cold or flu. Do not treat yourself. Try to avoid  being around people who are sick. Be careful brushing or flossing your teeth or using a toothpick because you may get an infection or bleed more easily. If you have any dental work done, tell your dentist you are receiving this medication. Avoid taking medications that contain aspirin, acetaminophen, ibuprofen, naproxen, or ketoprofen unless instructed by your care team. These medications may hide a fever. Check with your care team if you have severe diarrhea, nausea, and vomiting, or if you sweat a lot. The loss of too much body fluid may make it dangerous for you to take this medication. Talk to your care team if you or your partner wish to become pregnant or think either of you might be pregnant. This medication can cause serious birth defects if taken during pregnancy and for 6 months after the last dose. A negative pregnancy test is required before starting this medication. A reliable form of contraception is recommended while taking this medication and for 6 months after the last dose. Talk to your care team about reliable forms of contraception. Do not father a child while taking this medication and for 3 months after the last dose. Use a condom while having sex during this time period. Do not breastfeed while taking this medication and  for 1 week after the last dose. This medication may cause infertility. Talk to your care team if you are concerned about your fertility. What side effects may I notice from receiving this medication? Side effects that you should report to your care team as soon as possible: Allergic reactions--skin rash, itching, hives, swelling of the face, lips, tongue, or throat Dry cough, shortness of breath or trouble breathing Infection--fever, chills, cough, sore throat, wounds that don't heal, pain or trouble when passing urine, general feeling of discomfort or being unwell Kidney injury--decrease in the amount of urine, swelling of the ankles, hands, or feet Low red blood  cell level--unusual weakness or fatigue, dizziness, headache, trouble breathing Redness, blistering, peeling, or loosening of the skin, including inside the mouth Unusual bruising or bleeding Side effects that usually do not require medical attention (report to your care team if they continue or are bothersome): Fatigue Loss of appetite Nausea Vomiting This list may not describe all possible side effects. Call your doctor for medical advice about side effects. You may report side effects to FDA at 1-800-FDA-1088. Where should I keep my medication? This medication is given in a hospital or clinic. It will not be stored at home. NOTE: This sheet is a summary. It may not cover all possible information. If you have questions about this medicine, talk to your doctor, pharmacist, or health care provider.  2023 Elsevier/Gold Standard (2021-10-10 00:00:00)  Vitamin B12 Injection What is this medication? Vitamin B12 (VAHY tuh min B12) prevents and treats low vitamin B12 levels in your body. It is used in people who do not get enough vitamin B12 from their diet or when their digestive tract does not absorb enough. Vitamin B12 plays an important role in maintaining the health of your nervous system and red blood cells. This medicine may be used for other purposes; ask your health care provider or pharmacist if you have questions. COMMON BRAND NAME(S): B-12 Compliance Kit, B-12 Injection Kit, Cyomin, Dodex, LA-12, Nutri-Twelve, Physicians EZ Use B-12, Primabalt What should I tell my care team before I take this medication? They need to know if you have any of these conditions: Kidney disease Leber's disease Megaloblastic anemia An unusual or allergic reaction to cyanocobalamin, cobalt, other medications, foods, dyes, or preservatives Pregnant or trying to get pregnant Breast-feeding How should I use this medication? This medication is injected into a muscle or deeply under the skin. It is usually  given in a clinic or care team's office. However, your care team may teach you how to inject yourself. Follow all instructions. Talk to your care team about the use of this medication in children. Special care may be needed. Overdosage: If you think you have taken too much of this medicine contact a poison control center or emergency room at once. NOTE: This medicine is only for you. Do not share this medicine with others. What if I miss a dose? If you are given your dose at a clinic or care team's office, call to reschedule your appointment. If you give your own injections, and you miss a dose, take it as soon as you can. If it is almost time for your next dose, take only that dose. Do not take double or extra doses. What may interact with this medication? Alcohol Colchicine This list may not describe all possible interactions. Give your health care provider a list of all the medicines, herbs, non-prescription drugs, or dietary supplements you use. Also tell them if you smoke, drink alcohol,  or use illegal drugs. Some items may interact with your medicine. What should I watch for while using this medication? Visit your care team regularly. You may need blood work done while you are taking this medication. You may need to follow a special diet. Talk to your care team. Limit your alcohol intake and avoid smoking to get the best benefit. What side effects may I notice from receiving this medication? Side effects that you should report to your care team as soon as possible: Allergic reactions--skin rash, itching, hives, swelling of the face, lips, tongue, or throat Swelling of the ankles, hands, or feet Trouble breathing Side effects that usually do not require medical attention (report to your care team if they continue or are bothersome): Diarrhea This list may not describe all possible side effects. Call your doctor for medical advice about side effects. You may report side effects to FDA at  1-800-FDA-1088. Where should I keep my medication? Keep out of the reach of children. Store at room temperature between 15 and 30 degrees C (59 and 85 degrees F). Protect from light. Throw away any unused medication after the expiration date. NOTE: This sheet is a summary. It may not cover all possible information. If you have questions about this medicine, talk to your doctor, pharmacist, or health care provider.  2023 Elsevier/Gold Standard (2007-07-27 00:00:00)

## 2022-08-10 NOTE — Progress Notes (Signed)
[name]   [phone]  OFFICE PROGRESS NOTE  [name], [place] [address]  DIAGNOSIS: Stage IV (T2 a,N2, M1a) non-small cell lung cancer, adenocarcinoma diagnosed in July 2019 and presented with right upper lobe lung mass in addition to mediastinal lymphadenopathy as well as bilateral pulmonary nodules and malignant right pleural effusion.   Biomarker Findings Microsatellite status - Cannot Be Determined Tumor Mutational Burden - Cannot Be Determined Genomic Findings For a complete list of the genes assayed, please refer to the Appendix. EGFR exon 19 deletion ([id number]) TP53 Y220C 7 Disease relevant genes with no reportable alterations: KRAS, ALK, BRAF, MET, RET, ERBB2, ROS1    PRIOR THERAPY:  1) Status post right Pleurx catheter placement by Dr. Prescott Gum for drainage of malignant right pleural effusion. 2) palliative radiotherapy to the enlarging right upper lobe lung mass and mediastinum under the care of Dr. Sondra Come expected to be completed on January 05, 2021. 3) Tagrisso 80 mg p.o. daily.  First dose was given on 01/29/2018.  Status post 39 months of treatment.   CURRENT THERAPY: Systemic chemotherapy with carboplatin for AUC of 5 and Alimta 500 Mg/M2 every 3 weeks.  First dose 03/17/2021.  The patient will also continue her current treatment with Tagrisso 80 mg p.o. daily.  She is status post 23 cycles. Starting from cycle #7, she will be on Alimta only 400 mg/m2.  INTERVAL HISTORY: Allison [name] 72 y.o. female returns to the clinic today for follow-up visit accompanied by her boyfriend Allison [name].  The patient is feeling fine today with no concerning complaints except for baseline fatigue.  She denied having any current chest pain, shortness of breath except with exertion with no cough or hemoptysis.  She has no nausea, vomiting, diarrhea or constipation.  She has no headache or visual  changes.  She denied having any fever or chills.  She is here today for evaluation before starting cycle #24 of her treatment.  MEDICAL HISTORY: Past Medical History:  Diagnosis Date   Anemia    Anxiety    Arthritis    Asthma    exercise induced   Depression    PMH   Dyspnea    GERD (gastroesophageal reflux disease)    Glaucoma    History of radiation therapy 01/05/2021   IMRT right lung  11/24/2020-01/05/2021  Dr [name]   Hypertension    lung ca dx'd 11/2017   right   Malignant pleural effusion    right   Nuclear sclerotic cataract of right eye 02/28/2021   Dr. Warden Fillers, cataract surgery February 2023   PONV (postoperative nausea and vomiting)    Pre-diabetes    Raynaud's disease    Raynaud's disease    Stroke ([place]) 01/2021   balance off, some express aphasia, weakness    ALLERGIES:  is allergic to hydrocodone-acetaminophen, penicillins, vicodin [hydrocodone-acetaminophen], and other.  MEDICATIONS:  Current Outpatient Medications  Medication Sig Dispense Refill   acetaminophen (TYLENOL) 500 MG tablet Take 1,000 mg by mouth every 6 (six) hours as needed (pain).     ALPRAZolam (XANAX) 0.25 MG tablet Take 1 tablet (0.25 mg total) by mouth 2 (two) times daily as needed for anxiety. May take 2 tablets (0.5 mg total) at bedtime. 60 tablet 0   amLODipine (NORVASC) 5 MG tablet Take 1.5 tablets (7.5 mg total) by mouth daily.     brompheniramine-pseudoephedrine-DM 30-2-10 MG/5ML syrup Take 5 mLs  by mouth 3 (three) times daily as needed. (Patient not taking: Reported on 06/08/2022) 120 mL 0   carvedilol (COREG) 25 MG tablet Take 0.5 tablets (12.5 mg total) by mouth 2 (two) times daily.     cetirizine (ZYRTEC) 5 MG tablet Take 5 mg by mouth daily.     dexamethasone (DECADRON) 4 MG tablet TAKE 1 TABLET TWICE A DAY THE DAY BEFORE, THE DAY OF, AND THE DAY AFTER CHEMOTHERAPY 40 tablet 2   docusate sodium (COLACE) 100 MG capsule Take 1 capsule (100 mg total) by mouth every other  day. 15 capsule 5   dorzolamide-timolol (COSOPT) 22.3-6.8 MG/ML ophthalmic solution Place 1 drop into both eyes 2 (two) times daily.     dronabinol (MARINOL) 2.5 MG capsule Take 1 capsule (2.5 mg total) by mouth 2 (two) times daily as needed (decreased appetite). 60 capsule 0   enoxaparin (LOVENOX) 60 MG/0.6ML injection Inject 0.6 mLs (60 mg total) into the skin every 12 (twelve) hours. (Patient taking differently: Inject 80 mg into the skin daily. 80 mg) 60 mL 1   esomeprazole (NEXIUM) 20 MG capsule Take 1 capsule (20 mg total) by mouth in the morning and at bedtime. 60 capsule 2   ezetimibe (ZETIA) 10 MG tablet Take 10 mg by mouth daily.     FeFum-FePoly-FA-B Cmp-C-Biot (FOLIVANE-PLUS) CAPS TAKE 1 CAPSULE BY MOUTH EVERY DAY IN THE MORNING 90 capsule 0   FLUoxetine (PROZAC) 20 MG capsule Take 1 capsule (20 mg total) by mouth daily. 90 capsule 3   folic acid (FOLVITE) 1 MG tablet Take 1 tablet (1 mg total) by mouth daily. 90 tablet 1   hyoscyamine (LEVSIN SL) 0.125 MG SL tablet Place 2 tablets (0.25 mg total) under the tongue every 6 (six) hours as needed for cramping. Take 1-2 tablets under the tongue every 6 hours as needed for cramping 30 tablet 0   ipratropium-albuterol (DUONEB) 0.5-2.5 (3) MG/3ML SOLN Take 3 mLs by nebulization every 6 (six) hours as needed (Asthma).     isosorbide mononitrate (IMDUR) 30 MG 24 hr tablet TAKE 1 TABLET BY MOUTH EVERY DAY 90 tablet 3   latanoprost (XALATAN) 0.005 % ophthalmic solution Place 1 drop into both eyes at bedtime.      ondansetron (ZOFRAN-ODT) 4 MG disintegrating tablet Take 1 tablet (4 mg total) by mouth every 8 (eight) hours as needed for nausea or vomiting. 30 tablet 1   osimertinib mesylate (TAGRISSO) 80 MG tablet Take 1 tablet (80 mg total) by mouth daily. 30 tablet 4   potassium chloride SA (KLOR-CON M) 20 MEQ tablet Take 1 tablet (20 mEq total) by mouth daily. 7 tablet 0   REPATHA SURECLICK 462 MG/ML SOAJ INJECT 140 MG INTO THE SKIN EVERY 14  (FOURTEEN) DAYS. 6 mL 3   valACYclovir (VALTREX) 500 MG tablet Take 1 tablet twice a day by oral route for 5 days. (Patient not taking: Reported on 06/08/2022)     No current facility-administered medications for this visit.    SURGICAL HISTORY:  Past Surgical History:  Procedure Laterality Date   ABDOMINAL HYSTERECTOMY     partial   BRONCHIAL BIOPSY  04/21/2021   Procedure: BRONCHIAL BIOPSIES;  Surgeon: [name], DO;  Location: [facility];  Service: Pulmonary;;   BRONCHIAL BRUSHINGS  04/21/2021   Procedure: BRONCHIAL BRUSHINGS;  Surgeon: [name], DO;  Location: [facility];  Service: Pulmonary;;   BRONCHIAL NEEDLE ASPIRATION BIOPSY  04/21/2021   Procedure: BRONCHIAL NEEDLE ASPIRATION BIOPSIES;  Surgeon: Valeta Harms,  Octavio Graves, DO;  Location: [facility];  Service: Pulmonary;;   CHEST TUBE INSERTION Right 01/01/2018   Procedure: INSERTION PLEURAL DRAINAGE CATHETER;  Surgeon: Ivin Poot, MD;  Location: [facility];  Service: Thoracic;  Laterality: Right;   CHEST TUBE INSERTION  04/21/2021   Procedure: CHEST TUBE INSERTION;  Surgeon: [name], DO;  Location: [facility];  Service: Pulmonary;;   COLONOSCOPY     CORONARY STENT INTERVENTION N/A 06/16/2019   Procedure: CORONARY STENT INTERVENTION;  Surgeon: Jettie Booze, MD;  Location: [facility];  Service: Cardiovascular;  Laterality: N/A;   DILATION AND CURETTAGE OF UTERUS     EYE SURGERY     due to Glaucoma   IR IMAGING GUIDED PORT INSERTION  03/22/2021   IR PORT REPAIR CENTRAL VENOUS ACCESS DEVICE  04/15/2021   LEFT HEART CATH AND CORONARY ANGIOGRAPHY N/A 06/16/2019   Procedure: LEFT HEART CATH AND CORONARY ANGIOGRAPHY;  Surgeon: Jettie Booze, MD;  Location: [facility];  Service: Cardiovascular;  Laterality: N/A;   REMOVAL OF PLEURAL DRAINAGE CATHETER Right 11/07/2018   Procedure: REMOVAL OF PLEURAL DRAINAGE CATHETER;  Surgeon: Ivin Poot, MD;  Location: [facility];  Service: Thoracic;   Laterality: Right;   ROTATOR CUFF REPAIR     TUBAL LIGATION     VIDEO BRONCHOSCOPY WITH ENDOBRONCHIAL NAVIGATION Bilateral 04/21/2021   Procedure: VIDEO BRONCHOSCOPY WITH ENDOBRONCHIAL NAVIGATION;  Surgeon: [name], DO;  Location: [facility];  Service: Pulmonary;  Laterality: Bilateral;  ION   WISDOM TOOTH EXTRACTION      REVIEW OF SYSTEMS:  A comprehensive review of systems was negative except for: Constitutional: positive for fatigue Respiratory: positive for dyspnea on exertion   PHYSICAL EXAMINATION: General appearance: alert, cooperative, appears stated age, fatigued, and no distress Head: Normocephalic, without obvious abnormality, atraumatic Neck: no adenopathy, no JVD, supple, symmetrical, trachea midline, and thyroid not enlarged, symmetric, no tenderness/mass/nodules Lymph nodes: Cervical, supraclavicular, and axillary nodes normal. Resp: clear to auscultation bilaterally Back: symmetric, no curvature. ROM normal. No CVA tenderness. Cardio: regular rate and rhythm, S1, S2 normal, no murmur, click, rub or gallop GI: soft, non-tender; bowel sounds normal; no masses,  no organomegaly Extremities: extremities normal, atraumatic, no cyanosis or edema  ECOG PERFORMANCE STATUS: 1 - Symptomatic but completely ambulatory  Blood pressure 127/63, pulse 66, temperature 98.5 F (36.9 C), temperature source Oral, resp. rate 15, weight 125 lb 3.2 oz (56.8 kg), SpO2 100 %.  LABORATORY DATA: Lab Results  Component Value Date   WBC 4.0 08/10/2022   HGB 10.6 (L) 08/10/2022   HCT 31.8 (L) 08/10/2022   MCV 86.9 08/10/2022   PLT 197 08/10/2022      Chemistry      Component Value Date/Time   NA 140 06/08/2022 0822   NA 140 03/04/2021 1516   K 3.4 (L) 06/08/2022 0822   CL 107 06/08/2022 0822   CO2 26 06/08/2022 0822   BUN 23 06/08/2022 0822   BUN 21 03/04/2021 1516   CREATININE 1.23 (H) 06/08/2022 0822      Component Value Date/Time   CALCIUM 9.6 06/08/2022 0822    ALKPHOS 68 06/08/2022 0822   AST 47 (H) 06/08/2022 0822   ALT 45 (H) 06/08/2022 0822   BILITOT 0.2 (L) 06/08/2022 0822       RADIOGRAPHIC STUDIES: CT Chest W Contrast  Result Date: 06/05/2022 CLINICAL DATA:  Non-small-cell lung cancer. Restaging. * Tracking Code: BO * EXAM: CT CHEST WITH CONTRAST TECHNIQUE: Multidetector  CT imaging of the chest was performed during intravenous contrast administration. RADIATION DOSE REDUCTION: This exam was performed according to the departmental dose-optimization program which includes automated exposure control, adjustment of the mA and/or kV according to patient size and/or use of iterative reconstruction technique. CONTRAST:  30mL OMNIPAQUE IOHEXOL 300 MG/ML  SOLN COMPARISON:  04/03/2022 FINDINGS: Cardiovascular: The heart size is normal. No substantial pericardial effusion. Coronary artery calcification is evident. Mild atherosclerotic calcification is noted in the wall of the thoracic aorta. Right Port-A-Cath tip is positioned in the right atrium. Mediastinum/Nodes: No mediastinal lymphadenopathy. There is no hilar lymphadenopathy. The esophagus has normal imaging features. There is no axillary lymphadenopathy. Lungs/Pleura: Post treatment scarring in the parahilar right lung extending into the apex is stable in the interval. Volume loss in the right chest is unchanged. Subtle changes of centrilobular emphysema noted left lung. Anterior left lower lobe calcified granuloma again noted. No new suspicious pulmonary nodule or mass. Stable tiny right pleural effusion. Upper Abdomen: Unremarkable. Musculoskeletal: No worrisome lytic or sclerotic osseous abnormality. IMPRESSION: 1. Stable exam. No new or progressive findings. 2. Stable post treatment scarring in the parahilar right lung extending into the apex. 3. Stable tiny right pleural effusion. 4. Aortic Atherosclerosis (ICD10-I70.0) and Emphysema (ICD10-J43.9). Electronically Signed   By: Misty Stanley M.D.   On:  06/05/2022 10:57    ASSESSMENT AND PLAN: This is a very pleasant 72 years old never smoker African-American female recently with a stage IV non-small cell lung cancer, adenocarcinoma with positive EGFR mutation with deletion in exon 19. The patient was started on treatment with Tagrisso 80 mg p.o. daily status post 35 months of treatment. She has been tolerating this treatment well with no concerning adverse effects except for intermittent diarrhea. She had repeat CT scan of the chest, abdomen pelvis performed recently.  I personally and independently reviewed the scans and discussed the results with the patient and her boyfriend today. Unfortunately the CT scan showed interval progression of the right apical lung mass in addition to progression of mediastinal lymphadenopathy concerning for worsening of her disease. She has no actionable resistant mutation on the molecular studies performed by [facility]. The patient continued her current treatment with Tagrisso and tolerating it fairly well. 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 Sondra Come completed January 05, 2021. The patient had significant opacities in her lung that was initially thought to be secondary to radiation treatment versus Tagrisso induced pneumonitis versus lymphangitic spread of the tumor.  She was treated with high-dose taper regiment of prednisone Repeat imaging studies after the palliative radiotherapy showed evidence for disease progression. Her molecular studies by [facility] recently showed no new resistant mutation.  After discussion of her treatment options including palliative care and hospice referral versus palliative systemic chemotherapy the patient was interested in proceeding palliative systemic chemotherapy.  She started  palliative systemic chemotherapy with carboplatin for AUC of 5 and Alimta 500 Mg/M2 every 3  weeks.  Status post 23 cycles.  Starting from cycle #7 the patient is on treatment with single agent Alimta every 3 weeks. I did not add a Avastin to her treatment because of the recent stroke.  She also continued her treatment with Tagrisso at the same time.  The patient has been tolerating this treatment well with no concerning adverse effects. I recommended for her to proceed with cycle #24 today as planned. I will see her  back for follow-up visit in 3 weeks with repeat CT scan of the chest, abdomen and pelvis for restaging of her disease. For the anemia, she will continue with the oral iron tablets. The patient was advised to call immediately if she has any concerning symptoms in the interval. The patient voices understanding of current disease status and treatment options and is in agreement with the current care plan.  All questions were answered. The patient knows to call the clinic with any problems, questions or concerns. We can certainly see the patient much sooner if necessary.  he total time spent in the appointment was 20 minutes.  Disclaimer: This note was dictated with voice recognition software. Similar sounding words can inadvertently be transcribed and may not be corrected upon review.

## 2022-08-10 NOTE — Progress Notes (Signed)
Patient seen by MD today  Vitals are within treatment parameters.  Labs reviewed: and are within treatment parameters.  Per physician team, patient is ready for treatment and there are NO modifications to the treatment plan.

## 2022-08-12 ENCOUNTER — Other Ambulatory Visit: Payer: Self-pay | Admitting: Cardiology

## 2022-08-15 ENCOUNTER — Encounter (HOSPITAL_COMMUNITY): Payer: Medicare HMO

## 2022-08-17 ENCOUNTER — Encounter (HOSPITAL_COMMUNITY)
Admission: RE | Admit: 2022-08-17 | Discharge: 2022-08-17 | Disposition: A | Discharge status: Home / Self Care | Payer: Medicare HMO | Source: Ambulatory Visit | Attending: Pulmonary Disease | Admitting: Pulmonary Disease

## 2022-08-17 DIAGNOSIS — J479 Bronchiectasis, uncomplicated: Secondary | ICD-10-CM | POA: Diagnosis not present

## 2022-08-17 NOTE — Progress Notes (Signed)
Daily Session Note  Patient Details  Name: Allison [name] MRN: [id number] Date of Birth: [birth year] Referring Provider:   April Manson Pulmonary Rehab Walk Test from 07/12/2022 in [facility] for Heart, Vascular, & Lung Health  Referring Provider Icard       Encounter Date: 08/17/2022  Check In:  Session Check In - 08/17/22 1434       Check-In   Supervising physician immediately available to respond to emergencies [facility] MD immediately available    Physician(s) Dr Cyd Silence    Location [facility]    Staff Present Janine Ores, RN, Quentin Ore, MS, ACSM-CEP, Exercise Physiologist;Amareon Phung Yevonne Pax, ACSM-CEP, Exercise Physiologist;Samantha [place], RD, LDN;Other    Virtual Visit No    Medication changes reported     No    Fall or balance concerns reported    No    Tobacco Cessation No Change    Warm-up and Cool-down Performed as group-led instruction    Resistance Training Performed Yes    VAD Patient? No    PAD/SET Patient? No      Pain Assessment   Currently in Pain? No/denies    Multiple Pain Sites No             Capillary Blood Glucose: No results found. However, due to the size of the patient record, not all encounters were searched. Please check Results Review for a complete set of results.    Social History   Tobacco Use  Smoking Status Never  Smokeless Tobacco Never    Goals Met:  Independence with exercise equipment Exercise tolerated well No report of concerns or symptoms today Strength training completed today  Goals Unmet:  Not Applicable  Comments: Service time is from 1323 to 1445.    Dr. Rodman Pickle is Medical Director for Pulmonary Rehab at [facility]

## 2022-08-21 NOTE — Progress Notes (Signed)
Allison [name], counseling intern, met with patient for their scheduled counseling session.   The patient shared they went to the circus with their daughter, granddaughter, and son in law. The patient reflected they knew they overdid themselves but the event brought them joy. The patient shared they knew they would be "feeling it later" and they allowed themselves to rest.   The patient shared they worked things out with their partner. The patient reflected they are both readjusting to their new way of life.   The counselor asked the patient about their future goals. The patient responded "Maintain resilience and help others."   This was the last session with the patient. The patient has the counselor's number in case needs arise between now and the end of March.   Allison [name],  Counseling Intern [phone] [email or web address]

## 2022-08-22 ENCOUNTER — Encounter (HOSPITAL_COMMUNITY)
Admission: RE | Admit: 2022-08-22 | Discharge: 2022-08-22 | Disposition: A | Discharge status: Home / Self Care | Payer: Medicare HMO | Source: Ambulatory Visit | Attending: Pulmonary Disease | Admitting: Pulmonary Disease

## 2022-08-22 DIAGNOSIS — J479 Bronchiectasis, uncomplicated: Secondary | ICD-10-CM | POA: Diagnosis present

## 2022-08-22 DIAGNOSIS — Z5189 Encounter for other specified aftercare: Secondary | ICD-10-CM | POA: Diagnosis not present

## 2022-08-22 NOTE — Progress Notes (Signed)
Daily Session Note  Patient Details  Name: Allison [name] MRN: [id number] Date of Birth: April 02, 1951 Referring Provider:   April Manson Pulmonary Rehab Walk Test from 07/12/2022 in [facility] for Heart, Vascular, & Lung Health  Referring Provider Icard       Encounter Date: 08/22/2022  Check In:  Session Check In - 08/22/22 1426       Check-In   Supervising physician immediately available to respond to emergencies [facility] MD immediately available    Physician(s) Dr Gala Romney    [facility]    Staff Present Janine Ores, RN, Quentin Ore, MS, ACSM-CEP, Exercise Physiologist;Randi Yevonne Pax, ACSM-CEP, Exercise Physiologist;Samantha [place], RD, LDN;Other    Virtual Visit No    Medication changes reported     No    Fall or balance concerns reported    No    Tobacco Cessation No Change    Warm-up and Cool-down Performed as group-led instruction    Resistance Training Performed Yes    VAD Patient? No    PAD/SET Patient? No      Pain Assessment   Currently in Pain? No/denies    Multiple Pain Sites No             Capillary Blood Glucose: No results found. However, due to the size of the patient record, not all encounters were searched. Please check Results Review for a complete set of results.    Social History   Tobacco Use  Smoking Status Never  Smokeless Tobacco Never    Goals Met:  Proper associated with RPD/PD & O2 Sat Exercise tolerated well No report of concerns or symptoms today Strength training completed today  Goals Unmet:  Not Applicable  Comments: Service time is from 1314 to 1447.    Dr. Rodman Pickle is Medical Director for Pulmonary Rehab at [facility].

## 2022-08-23 NOTE — Addendum Note (Signed)
Encounter addended by: Janine Ores, RN on: 08/23/2022 2:01 PM  Actions taken: Flowsheet data copied forward, Flowsheet accepted

## 2022-08-24 ENCOUNTER — Encounter (HOSPITAL_COMMUNITY)
Admission: RE | Admit: 2022-08-24 | Discharge: 2022-08-24 | Disposition: A | Discharge status: Home / Self Care | Payer: Medicare HMO | Source: Ambulatory Visit | Attending: Pulmonary Disease | Admitting: Pulmonary Disease

## 2022-08-24 VITALS — Wt 126.3 lb

## 2022-08-24 DIAGNOSIS — J479 Bronchiectasis, uncomplicated: Secondary | ICD-10-CM

## 2022-08-24 NOTE — Progress Notes (Signed)
Daily Session Note  Patient Details  Name: Allison [name] MRN: [id number] Date of Birth: 1951-04-05 Referring Provider:   April Manson Pulmonary Rehab Walk Test from 07/12/2022 in [facility] for Heart, Vascular, & [place]  Referring Provider Icard       Encounter Date: 08/24/2022  Check In:  Session Check In - 08/24/22 1414       Check-In   Supervising physician immediately available to respond to emergencies [facility] MD immediately available    Physician(s) Gerald Leitz, NP    [facility]    Staff Present Janine Ores, RN, Quentin Ore, MS, ACSM-CEP, Exercise Physiologist;Randi Yevonne Pax, ACSM-CEP, Exercise Physiologist;Samantha [place], RD, LDN;Other;Bailey Pearline Cables, MS, Exercise Physiologist    Virtual Visit No    Medication changes reported     No    Fall or balance concerns reported    No    Tobacco Cessation No Change    Warm-up and Cool-down Performed as group-led instruction    Resistance Training Performed Yes    VAD Patient? No    PAD/SET Patient? No      Pain Assessment   Currently in Pain? No/denies    Pain Score 0-No pain    Multiple Pain Sites No             Capillary Blood Glucose: No results found. However, due to the size of the patient record, not all encounters were searched. Please check Results Review for a complete set of results.   Exercise Prescription Changes - 08/24/22 1500       Response to Exercise   Blood Pressure (Admit) 148/60    Blood Pressure (Exercise) 122/65    Blood Pressure (Exit) 118/66    Heart Rate (Admit) 91 bpm    Heart Rate (Exercise) 88 bpm    Heart Rate (Exit) 100 bpm    Oxygen Saturation (Admit) 99 %    Oxygen Saturation (Exercise) 100 %    Oxygen Saturation (Exit) 96 %    Rating of Perceived Exertion (Exercise) 11    Perceived Dyspnea (Exercise) 1    Duration Continue with 30 min of aerobic exercise without signs/symptoms of physical distress.    Intensity THRR  unchanged      Progression   Progression Continue to progress workloads to maintain intensity without signs/symptoms of physical distress.      Resistance Training   Training Prescription Yes    Weight red bands    Reps 10-15    Time 10 Minutes      NuStep   Level 4    SPM 60    Minutes 15    METs 2.2      Track   Laps 7    Minutes 15    METs 2.08             Social History   Tobacco Use  Smoking Status Never  Smokeless Tobacco Never    Goals Met:  Proper associated with RPD/PD & O2 Sat Independence with exercise equipment Exercise tolerated well No report of concerns or symptoms today Strength training completed today  Goals Unmet:  Not Applicable  Comments: Service time is from 1322 to 1445.    Dr. Rodman Pickle is Medical Director for Pulmonary Rehab at [facility].

## 2022-08-27 ENCOUNTER — Other Ambulatory Visit: Payer: Self-pay | Admitting: Internal Medicine

## 2022-08-27 ENCOUNTER — Ambulatory Visit (HOSPITAL_BASED_OUTPATIENT_CLINIC_OR_DEPARTMENT_OTHER)
Admission: RE | Admit: 2022-08-27 | Discharge: 2022-08-27 | Disposition: A | Discharge status: Home / Self Care | Payer: Medicare HMO | Source: Ambulatory Visit | Attending: Internal Medicine | Admitting: Internal Medicine

## 2022-08-27 DIAGNOSIS — C3490 Malignant neoplasm of unspecified part of unspecified bronchus or lung: Secondary | ICD-10-CM | POA: Diagnosis present

## 2022-08-27 DIAGNOSIS — C3491 Malignant neoplasm of unspecified part of right bronchus or lung: Secondary | ICD-10-CM

## 2022-08-27 MED ORDER — IOHEXOL 300 MG/ML  SOLN
100.0000 mL | Freq: Once | INTRAMUSCULAR | Status: AC | PRN
  Administered 2022-08-27: 70 mL via INTRAVENOUS

## 2022-08-28 LAB — POCT I-STAT CREATININE: Creatinine, Ser: 1.3 mg/dL — ABNORMAL HIGH (ref 0.44–1.00)

## 2022-08-29 ENCOUNTER — Encounter (HOSPITAL_COMMUNITY)
Admission: RE | Admit: 2022-08-29 | Discharge: 2022-08-29 | Disposition: A | Discharge status: Home / Self Care | Payer: Medicare HMO | Source: Ambulatory Visit | Attending: Pulmonary Disease | Admitting: Pulmonary Disease

## 2022-08-29 DIAGNOSIS — J479 Bronchiectasis, uncomplicated: Secondary | ICD-10-CM

## 2022-08-29 NOTE — Progress Notes (Unsigned)
[facility] OFFICE PROGRESS NOTE  [name], [place] [address]  DIAGNOSIS: Stage IV (T2 a,N2, M1a) non-small cell lung cancer, adenocarcinoma diagnosed in July 2019 and presented with right upper lobe lung mass in addition to mediastinal lymphadenopathy as well as bilateral pulmonary nodules and malignant right pleural effusion.   Biomarker Findings Microsatellite status - Cannot Be Determined Tumor Mutational Burden - Cannot Be Determined Genomic Findings For a complete list of the genes assayed, please refer to the Appendix. EGFR exon 19 deletion [id number]) TP53 Y220C 7 Disease relevant genes with no reportable alterations: KRAS, ALK, BRAF, MET, RET, ERBB2, ROS1   PRIOR THERAPY: 1) Status post right Pleurx catheter placement by Dr. Prescott Gum for drainage of malignant right pleural effusion. 2) palliative radiotherapy to the enlarging right upper lobe lung mass and mediastinum under the care of Dr. Sondra Come expected to be completed on January 05, 2021. 3) Tagrisso 80 mg p.o. daily.  First dose was given on 01/29/2018.  Status post 39 months of treatment.  CURRENT THERAPY: Systemic chemotherapy with carboplatin for AUC of 5 and Alimta 500 Mg/M2 every 3 weeks.  First dose 03/17/2021.  The patient will also continue her current treatment with Tagrisso 80 mg p.o. daily.  She is status post 25 cycles. Starting from cycle #7, she will be on Alimta only 500 mg/m2.  Alimta was eventually reduced to 400 mg per metered squared due to renal insufficiency. She is going to take a break from treatment with IV chemotherapy from 3/14-5/14  INTERVAL HISTORY: Allison [name] 72 y.o. female returns to the clinic today for a follow-up visit accompanied by her significant other.  The patient is currently undergoing single agent dosed adjusted Alimta IV every 3 weeks.  She tolerates this well except for fatigue. She is wondering if she can take a break from  treatment. Her dose is slightly reduced due to renal insufficiency. Overall, the patient tolerates treatment well except she experiences fatigue and nausea following treatment.  She has Zofran which is not effective for her and Compazine but after taking this 1 time she reports some blurry vision.  I sent her prescription for phenergan but she was concerned to take this due to reading the side effect profile of constipation and blurry vision.    The patient is currently undergoing cardiac rehab.  She has some mild bilateral ankle swelling, sometimes improves with elevating her leg in the morning.  She has compression stockings at home.  She follows closely with palliative care and overall many of her symptoms have improved.  Her anxiety is controlled. She has been talking to the counselor here at the cancer center about her anxiety which has been helpful for her.  Her grandson is graduating from school in July 2024 and she is hoping to go up to see his graduation.  She denies any recent fever, chills, although she is always cold natured. Of note, she has stable anemia. She has stable dyspnea on exertion. Denies significant cough unless she is trying to bring up phlegm. She still sometimes feels tightness in her chest which is chronic and stable. She denies sick contacts or other symptoms of infection like sore throat.  She denies any hemoptysis. She uses Tylenol for chest tightness. She follows with Dr. Valeta Harms from [facility] pulmonary medicine. She also takes levin for abdominal cramping.  Denies any rashes at this time.  She continues to take her iron supplement.  She denies any  headache or visual changes. She recently had a restaging CT scan performed. She is here for evaluation, to review her scan before starting cycle #26.     3 month break. 2 more treatments then break for July. If may scan   Scan in May mid may.     MEDICAL HISTORY: Past Medical History:  Diagnosis Date   Anemia    Anxiety     Arthritis    Asthma    exercise induced   Coronary artery disease    Depression    PMH   Dyspnea    GERD (gastroesophageal reflux disease)    Glaucoma    History of radiation therapy 01/05/2021   IMRT right lung  11/24/2020-01/05/2021  Dr [name]   Hypertension    lung ca dx'd 11/2017   right   Malignant pleural effusion    right   Nuclear sclerotic cataract of right eye 02/28/2021   Dr. Warden Fillers, cataract surgery February 2023   PONV (postoperative nausea and vomiting)    Pre-diabetes    Raynaud's disease    Raynaud's disease    Stroke ([facility]) 01/2021   balance off, some express aphasia, weakness    ALLERGIES:  is allergic to hydrocodone-acetaminophen, penicillins, lactose, vicodin [hydrocodone-acetaminophen], lactase-lactobacillus, and other.  MEDICATIONS:  Current Outpatient Medications  Medication Sig Dispense Refill   acetaminophen (TYLENOL) 500 MG tablet Take 1,000 mg by mouth every 6 (six) hours as needed (pain).     ALPRAZolam (XANAX) 0.25 MG tablet Take 1 tablet (0.25 mg total) by mouth 2 (two) times daily as needed for anxiety. May take 2 tablets (0.5 mg total) at bedtime. 60 tablet 0   amLODipine (NORVASC) 5 MG tablet Take 1.5 tablets (7.5 mg total) by mouth daily.     carvedilol (COREG) 25 MG tablet Take 0.5 tablets (12.5 mg total) by mouth 2 (two) times daily.     dexamethasone (DECADRON) 4 MG tablet TAKE 1 TABLET TWICE A DAY THE DAY BEFORE, THE DAY OF, AND THE DAY AFTER CHEMOTHERAPY 40 tablet 2   docusate sodium (COLACE) 100 MG capsule Take 1 capsule (100 mg total) by mouth every other day. 15 capsule 5   dorzolamide-timolol (COSOPT) 22.3-6.8 MG/ML ophthalmic solution Place 1 drop into both eyes 2 (two) times daily.     enoxaparin (LOVENOX) 80 MG/0.8ML injection Inject 80 mg into the skin daily.     ezetimibe (ZETIA) 10 MG tablet Take 10 mg by mouth daily.     FeFum-FePoly-FA-B Cmp-C-Biot (FOLIVANE-PLUS) CAPS TAKE 1 CAPSULE BY MOUTH EVERY DAY IN THE  MORNING 90 capsule 0   FLUoxetine (PROZAC) 20 MG capsule Take 1 capsule (20 mg total) by mouth daily. 90 capsule 3   folic acid (FOLVITE) 1 MG tablet Take 1 tablet (1 mg total) by mouth daily. 90 tablet 1   hyoscyamine (LEVSIN SL) 0.125 MG SL tablet PLACE 1-2 TABLETS UNDER THE TONGUE EVERY 6 HOURS AS NEEDED FOR CRAMPING 30 tablet 0   ipratropium-albuterol (DUONEB) 0.5-2.5 (3) MG/3ML SOLN Take 3 mLs by nebulization every 6 (six) hours as needed (Asthma).     isosorbide mononitrate (IMDUR) 30 MG 24 hr tablet TAKE 1 TABLET BY MOUTH EVERY DAY 90 tablet 3   latanoprost (XALATAN) 0.005 % ophthalmic solution Place 1 drop into both eyes at bedtime.      ondansetron (ZOFRAN-ODT) 4 MG disintegrating tablet Take 1 tablet (4 mg total) by mouth every 8 (eight) hours as needed for nausea or vomiting. 30 tablet 1  osimertinib mesylate (TAGRISSO) 80 MG tablet Take 1 tablet (80 mg total) by mouth daily. 30 tablet 4   prochlorperazine (COMPAZINE) 10 MG tablet Take 1 tablet (10 mg total) by mouth every 6 (six) hours as needed. 30 tablet 2   promethazine (PHENERGAN) 12.5 MG tablet Take 1 tablet (12.5 mg total) by mouth every 6 (six) hours as needed for nausea or vomiting. 30 tablet 2   REPATHA SURECLICK [id number] MG/ML SOAJ INJECT 140 MG INTO THE SKIN EVERY 14 (FOURTEEN) DAYS. 6 mL 3   valACYclovir (VALTREX) 500 MG tablet      No current facility-administered medications for this visit.    SURGICAL HISTORY:  Past Surgical History:  Procedure Laterality Date   ABDOMINAL HYSTERECTOMY     partial   BRONCHIAL BIOPSY  04/21/2021   Procedure: BRONCHIAL BIOPSIES;  Surgeon: [name], DO;  Location: [facility];  Service: Pulmonary;;   BRONCHIAL BRUSHINGS  04/21/2021   Procedure: BRONCHIAL BRUSHINGS;  Surgeon: [name], DO;  Location: [facility];  Service: Pulmonary;;   BRONCHIAL NEEDLE ASPIRATION BIOPSY  04/21/2021   Procedure: BRONCHIAL NEEDLE ASPIRATION BIOPSIES;  Surgeon: [name], DO;   Location: [facility];  Service: Pulmonary;;   CHEST TUBE INSERTION Right 01/01/2018   Procedure: INSERTION PLEURAL DRAINAGE CATHETER;  Surgeon: Ivin Poot, MD;  Location: [facility] OR;  Service: Thoracic;  Laterality: Right;   CHEST TUBE INSERTION  04/21/2021   Procedure: CHEST TUBE INSERTION;  Surgeon: [name], DO;  Location: [facility];  Service: Pulmonary;;   COLONOSCOPY     CORONARY STENT INTERVENTION N/A 06/16/2019   Procedure: CORONARY STENT INTERVENTION;  Surgeon: Jettie Booze, MD;  Location: [facility];  Service: Cardiovascular;  Laterality: N/A;   DILATION AND CURETTAGE OF UTERUS     EYE SURGERY     due to Glaucoma   IR IMAGING GUIDED PORT INSERTION  03/22/2021   IR PORT REPAIR CENTRAL VENOUS ACCESS DEVICE  04/15/2021   LEFT HEART CATH AND CORONARY ANGIOGRAPHY N/A 06/16/2019   Procedure: LEFT HEART CATH AND CORONARY ANGIOGRAPHY;  Surgeon: Jettie Booze, MD;  Location: [facility];  Service: Cardiovascular;  Laterality: N/A;   REMOVAL OF PLEURAL DRAINAGE CATHETER Right 11/07/2018   Procedure: REMOVAL OF PLEURAL DRAINAGE CATHETER;  Surgeon: Ivin Poot, MD;  Location: [facility];  Service: Thoracic;  Laterality: Right;   ROTATOR CUFF REPAIR     TUBAL LIGATION     VIDEO BRONCHOSCOPY WITH ENDOBRONCHIAL NAVIGATION Bilateral 04/21/2021   Procedure: VIDEO BRONCHOSCOPY WITH ENDOBRONCHIAL NAVIGATION;  Surgeon: [name], DO;  Location: [facility];  Service: Pulmonary;  Laterality: Bilateral;  ION   WISDOM TOOTH EXTRACTION      REVIEW OF SYSTEMS:   Review of Systems  Constitutional: Positive for fatigue with overactivity/after treatment.  Negative for appetite change, chills, and fever.  HENT: Negative for mouth sores, nosebleeds, sore throat and trouble swallowing.  Eyes: Negative for eye problems and icterus.  Respiratory: Positive for stable dyspnea with overactivity.  Negative for significant cough, hemoptysis and wheezing.    Cardiovascular: Negative for chest pain.  Mild ankle swelling bilaterally. Gastrointestinal: Positive for indigestion following treatment and nausea and constipation. Negative for abdominal pain, diarrhea, and vomiting.  Genitourinary: Negative for bladder incontinence, difficulty urinating, dysuria, frequency and hematuria.   Musculoskeletal: Negative for back pain, gait problem, neck pain and neck stiffness.  Skin: Negative for itching and rash.  Neurological: Negative for dizziness, extremity weakness, gait problem,  headaches, light-headedness and seizures.  Hematological: Negative for adenopathy. Does not bruise/bleed easily.  Psychiatric/Behavioral: Negative for confusion, depression and sleep disturbance. The patient is not nervous/anxious.    PHYSICAL EXAMINATION:  Blood pressure (!) 150/72, pulse 67, temperature 100 F (37.8 C), resp. rate 16, weight 127 lb 12.8 oz (58 kg).  ECOG PERFORMANCE STATUS: 1  Physical Exam  Constitutional: Oriented to person, place, and time and thin appearing female and in no distress.  HENT:  Head: Normocephalic and atraumatic.  Mouth/Throat: Oropharynx is clear and moist. No oropharyngeal exudate.  Eyes: Conjunctivae are normal. Right eye exhibits no discharge. Left eye exhibits no discharge. No scleral icterus.  Neck: Normal range of motion. Neck supple.  Cardiovascular: Normal rate, regular rhythm, normal heart sounds and intact distal pulses.   Pulmonary/Chest: Effort normal and breath sounds normal. No respiratory distress. No wheezes. No rales.  Abdominal: Soft. Bowel sounds are normal. Exhibits no distension and no mass. There is no tenderness.  Musculoskeletal: Normal range of motion.  Mild ankle edema bilaterally. Lymphadenopathy:    No cervical adenopathy.  Neurological: Alert and oriented to person, place, and time. Exhibits normal muscle tone. Gait normal. Coordination normal.  Skin: Skin is warm and dry. No rash noted. Not diaphoretic.  No erythema. No pallor.  Psychiatric: Mood, memory and judgment normal.  Vitals reviewed.  LABORATORY DATA: Lab Results  Component Value Date   WBC 3.8 (L) 08/31/2022   HGB 10.2 (L) 08/31/2022   HCT 31.3 (L) 08/31/2022   MCV 87.2 08/31/2022   PLT 216 08/31/2022      Chemistry      Component Value Date/Time   NA 142 08/31/2022 0841   NA 140 03/04/2021 1516   K 3.4 (L) 08/31/2022 0841   CL 109 08/31/2022 0841   CO2 27 08/31/2022 0841   BUN 21 08/31/2022 0841   BUN 21 03/04/2021 1516   CREATININE 1.19 (H) 08/31/2022 0841      Component Value Date/Time   CALCIUM 9.2 08/31/2022 0841   ALKPHOS 65 08/31/2022 0841   AST 32 08/31/2022 0841   ALT 33 08/31/2022 0841   BILITOT 0.2 (L) 08/31/2022 0841       RADIOGRAPHIC STUDIES:  CT CHEST ABDOMEN PELVIS W CONTRAST  Result Date: 08/28/2022 CLINICAL DATA:  Non-small cell lung cancer.  * Tracking Code: BO * EXAM: CT CHEST, ABDOMEN, AND PELVIS WITH CONTRAST TECHNIQUE: Multidetector CT imaging of the chest, abdomen and pelvis was performed following the standard protocol during bolus administration of intravenous contrast. RADIATION DOSE REDUCTION: This exam was performed according to the departmental dose-optimization program which includes automated exposure control, adjustment of the mA and/or kV according to patient size and/or use of iterative reconstruction technique. CONTRAST:  31m OMNIPAQUE IOHEXOL 300 MG/ML  SOLN COMPARISON:  CT chest 06/05/2022 and CT abdomen pelvis 04/03/2022. FINDINGS: CT CHEST FINDINGS Cardiovascular: Right IJ Port-A-Cath terminates in the right atrium. Atherosclerotic calcification of the aorta, aortic valve and coronary arteries. Heart size normal. No pericardial effusion. Mediastinum/Nodes: No pathologically enlarged mediastinal, hilar or axillary lymph nodes. Esophagus is grossly unremarkable. Lungs/Pleura: Post radiation consolidation and bronchiectasis in the superomedial right hemithorax. Similar smooth  septal thickening in the aerated right lung. Thin rind of pleural fluid in the right hemithorax, as before. Calcified granulomas. No left pleural fluid. Minimal debris in the airway. Musculoskeletal: There may be an old left rib fracture. No worrisome lytic or sclerotic lesions. CT ABDOMEN PELVIS FINDINGS Hepatobiliary: Liver and gallbladder are unremarkable. No biliary ductal dilatation.  Pancreas: Negative. Spleen: Negative. Adrenals/Urinary Tract: Adrenal glands are unremarkable. Scarring in the kidneys bilaterally. Subcentimeter low-attenuation lesions in the kidneys. No specific follow-up necessary. Ureters are decompressed. Bladder may be slightly thick walled. Stomach/Bowel: Stomach, small bowel and colon are unremarkable. Appendix is not readily visualized. Vascular/Lymphatic: Atherosclerotic calcification of the aorta. No pathologically enlarged lymph nodes. Reproductive: Hysterectomy.  No adnexal mass. Other: There may be trace pelvic free fluid. 5 mm ileocolic mesenteric lymph node (2/74), unchanged. Musculoskeletal: Degenerative changes in the spine. Minimal grade 1 anterolisthesis of L5 on S1. IMPRESSION: 1. Postradiation scarring in the upper right hemithorax with a small right fibrothorax. No evidence of recurrent or metastatic disease. 2. Aortic atherosclerosis (ICD10-I70.0). Coronary artery calcification. Electronically Signed   By: Lorin Picket M.D.   On: 08/28/2022 10:35     ASSESSMENT/PLAN:  This is a very pleasant 72 year old never smoker African-American female diagnosed with stage IV non-small cell lung cancer, adenocarcinoma.  She was positive for an EGFR mutation with deletion in exon 19.  She was diagnosed in July 2019 and presented with right upper lobe lung mass in addition to mediastinal lymphadenopathy as well as bilateral pulmonary nodules and malignant right pleural effusion.   The patient was started on treatment with Tagrisso 80 mg p.o. daily status post 39 months of  treatment. Started on 01/29/2018.  In April 2022, she showed evidence of disease progression with interval progression of the right apical lung mass in addition to progression of mediastinal lymphadenopathy concerning for worsening of her disease. She had repeat Guardant 360 molecular studies which did not show evidence of new resistant mutations.  Therefore, she was referred to radiation oncology and completed palliative radiotherapy to the enlarging right upper lobe lung mass and mediastinum under the care of Dr. Sondra Come.  This was completed in July 2022.    Unfortunately, the patient  was found to have evidence of disease progression.  Therefore, Dr. Julien Nordmann started the patient on systemic chemotherapy with carboplatin for AUC of 5 and Alimta 500 mg per metered squared.  She is status post 23 cycles and she tolerated it fairly well despite her ongoing issues with fatigue, chest tightness, cough, and weight loss.  Alimta was reduced to 400 mg per metered squared due to renal insufficiency.  She is not a good candidate for Avastin due to her recent CVA in August 2022.  She also is continuing to take Tagrisso as it is protective against progressive metastatic disease to the brain. Starting from cycle #7,  she started maintenance single agent alimta.    The patient underwent a repeat bronchoscopy and biopsy for repeat molecular testing.  The sample from the left and right lung biopsy was negative for malignancy. This was performed in November 2022.   The patient saw Dr. Durenda Hurt from [facility] for second opinion in November 2022.  They discussed if she progresses on chemotherapy that there may be upcoming options. He discussed other treatment options which may be available in 2023 including patritumab deruxtcan and lazertinib/amivantmab. If these are not available, he wrote that they can determine if they can get the agents through an expanded access, single patient IND, or clinical trial if she has  progression with chemotherapy.     The patient recently had a restaging CT scan performed.  Dr. Julien Nordmann personally independently reviewed the scan and discussed the results with the patient today.  The scan did not show any evidence of disease progression.  The patient is wondering if she can  undergo a treatment break.  Dr. Julien Nordmann had lengthy discussion with the patient about this.  We will give the patient a 73-monthbreak.  She will proceed with cycle #26 today scheduled but we will see her back in 6 weeks for port flush and see her back in 8 weeks for restaging CT scan of the chest, abdomen, and pelvis and follow-up visit.  If her scan continues to look stable at that time, we will give her another treatment break for 2 to 3 months so she may attend her grandsons graduation in July.  The patient asked that I check her Tagrisso for drug to drug interactions with her other medications that could be exacerbating her fatigue.  The drug to drug interactions with TNewman Nipis with her Prozac and Zofran for possible prolonged QT which we are aware about.  If she has any concerns about her cardiac medications causing fatigue, that would be a risks/benefits discussion with her cardiologist about possibly discontinuing or reducing the dose of these.  Otherwise encouraged the patient to stay active and have good nutrition.  She also will try taking a multivitamin.  Continue with pulmonary rehab.  We will see her back for follow-up visit in May with a restaging scan.  The patient was advised to call immediately if she has any concerning symptoms in the interval. The patient voices understanding of current disease status and treatment options and is in agreement with the current care plan. All questions were answered. The patient knows to call the clinic with any problems, questions or concerns. We can certainly see the patient much sooner if necessary  Orders Placed This Encounter  Procedures   CT CHEST  ABDOMEN PELVIS W CONTRAST    Standing Status:   Future    Standing Expiration Date:   08/31/2023    Order Specific Question:   If indicated for the ordered procedure, I authorize the administration of contrast media per Radiology protocol    Answer:   Yes    Order Specific Question:   Does the patient have a contrast media/X-ray dye allergy?    Answer:   No    Order Specific Question:   [facility]    [facility]   Order Specific Question:   Is Oral Contrast requested for this exam?    Answer:   Yes, Per Radiology protocol   CBC with Differential ([place])    Standing Status:   Future    Standing Expiration Date:   08/31/2023   CMP ([facility])    Standing Status:   Future    Standing Expiration Date:   08/31/2023      CTobe SosHeilingoetter, PA-C 08/31/22  ADDENDUM: Hematology/Oncology Attending: I had a face-to-face encounter with the patient today.  I reviewed her record, lab, scan and recommended her care plan.  This is a very pleasant 72years old African-American female with stage IV non-small cell lung cancer, adenocarcinoma with positive EGFR mutation with deletion in exon 170diagnosed in July 2019.  The patient is status post treatment with single agent Tagrisso 80 mg p.o. daily for 39 months before she had evidence for disease progression with progressive mediastinal lymphadenopathy status post palliative radiotherapy.  She has further disease progression and started on systemic chemotherapy with carboplatin and Alimta in addition to TWinslowinitially for 6 cycles followed by maintenance treatment with Alimta and Tagrisso for a total of 21 more cycles.  The patient  has been tolerating this treatment fairly well with no concerning adverse effects except for fatigue.  She continues to have baseline shortness of breath with exertion. She had repeat CT scan of the chest, abdomen and pelvis performed recently.  I personally and  independently reviewed the scan images and discussed the result with the patient and her boyfriend today. Her scan showed no concerning findings for disease progression. The patient is interested in a break of chemotherapy.  I recommended for her to proceed with her treatment today and then we will give her a break for the next 2 months with repeat imaging studies close to the end of May 2024.  If she has no evidence for disease progression at that time, she may get additional break of the chemotherapy. The patient is in agreement with the current plan. She was advised to call immediately if she has any other concerning symptoms in the interval. The total time spent in the appointment was 30 minutes. Disclaimer: This note was dictated with voice recognition software. Similar sounding words can inadvertently be transcribed and may be missed upon review. Eilleen Kempf, MD

## 2022-08-29 NOTE — Progress Notes (Signed)
Daily Session Note  Patient Details  Name: Allison [name] MRN: [id number] Date of Birth: [birth year] Referring Provider:   April Manson Pulmonary Rehab Walk Test from 07/12/2022 in [facility]  Referring Provider Icard       Encounter Date: 08/29/2022  Check In:  Session Check In - 08/29/22 1412       Check-In   Supervising physician immediately available to respond to emergencies [facility] MD immediately available    Physician(s) Eric Form, NP    Location [facility]    Staff Present Janine Ores, RN, Quentin Ore, MS, ACSM-CEP, Exercise Physiologist;Shalamar Plourde Yevonne Pax, ACSM-CEP, Exercise Physiologist;Samantha [place], RD, LDN;Other;Bailey Pearline Cables, MS, Exercise Physiologist    Virtual Visit No    Medication changes reported     No    Fall or balance concerns reported    No    Tobacco Cessation No Change    Warm-up and Cool-down Performed as group-led instruction    Resistance Training Performed Yes    VAD Patient? No    PAD/SET Patient? No      Pain Assessment   Currently in Pain? No/denies    Pain Score 0-No pain    Multiple Pain Sites No             Capillary Blood Glucose: No results found. However, due to the size of the patient record, not all encounters were searched. Please check Results Review for a complete set of results.    Social History   Tobacco Use  Smoking Status Never  Smokeless Tobacco Never    Goals Met:  Independence with exercise equipment Exercise tolerated well No report of concerns or symptoms today Strength training completed today  Goals Unmet:  Not Applicable  Comments: Service time is from 1325 to 1450.    Dr. Rodman Pickle is Medical Director for Pulmonary Rehab at [facility].

## 2022-08-30 NOTE — Progress Notes (Signed)
Pulmonary Individual Treatment Plan  Patient Details  Name: AALEAH DINOLFO MRN: [id number] Date of Birth: 02-04-1951 Referring Provider:   April Manson Pulmonary Rehab Walk Test from 07/12/2022 in [facility] [place]  Referring Provider Icard       Initial Encounter Date:  Flowsheet Row Pulmonary Rehab Walk Test from 07/12/2022 in [facility] [place]  Date 07/12/22       Visit Diagnosis: Bronchiectasis without complication ([place])  Patient's Home Medications on Admission:   Current Outpatient Medications:    acetaminophen (TYLENOL) 500 MG tablet, Take 1,000 mg by mouth every 6 (six) hours as needed (pain)., Disp: , Rfl:    ALPRAZolam (XANAX) 0.25 MG tablet, Take 1 tablet (0.25 mg total) by mouth 2 (two) times daily as needed for anxiety. May take 2 tablets (0.5 mg total) at bedtime., Disp: 60 tablet, Rfl: 0   amLODipine (NORVASC) 5 MG tablet, Take 1.5 tablets (7.5 mg total) by mouth daily., Disp: , Rfl:    brompheniramine-pseudoephedrine-DM 30-2-10 MG/5ML syrup, Take 5 mLs by mouth 3 (three) times daily as needed., Disp: 120 mL, Rfl: 0   carvedilol (COREG) 25 MG tablet, Take 0.5 tablets (12.5 mg total) by mouth 2 (two) times daily., Disp: , Rfl:    cetirizine (ZYRTEC) 5 MG tablet, Take 5 mg by mouth daily. (Patient not taking: Reported on 07/12/2022), Disp: , Rfl:    dexamethasone (DECADRON) 4 MG tablet, TAKE 1 TABLET TWICE A DAY THE DAY BEFORE, THE DAY OF, AND THE DAY AFTER CHEMOTHERAPY, Disp: 40 tablet, Rfl: 2   docusate sodium (COLACE) 100 MG capsule, Take 1 capsule (100 mg total) by mouth every other day., Disp: 15 capsule, Rfl: 5   dorzolamide-timolol (COSOPT) 22.3-6.8 MG/ML ophthalmic solution, Place 1 drop into both eyes 2 (two) times daily., Disp: , Rfl:    dronabinol (MARINOL) 2.5 MG capsule, Take 1 capsule (2.5 mg total) by mouth 2 (two) times daily as needed (decreased  appetite). (Patient not taking: Reported on 08/10/2022), Disp: 60 capsule, Rfl: 0   enoxaparin (LOVENOX) 80 MG/0.8ML injection, Inject 80 mg into the skin daily., Disp: , Rfl:    esomeprazole (NEXIUM) 20 MG capsule, Take 1 capsule (20 mg total) by mouth in the morning and at bedtime. (Patient not taking: Reported on 07/12/2022), Disp: 60 capsule, Rfl: 2   ezetimibe (ZETIA) 10 MG tablet, Take 10 mg by mouth daily., Disp: , Rfl:    FeFum-FePoly-FA-B Cmp-C-Biot (FOLIVANE-PLUS) CAPS, TAKE 1 CAPSULE BY MOUTH EVERY DAY IN THE MORNING, Disp: 90 capsule, Rfl: 0   FLUoxetine (PROZAC) 20 MG capsule, Take 1 capsule (20 mg total) by mouth daily., Disp: 90 capsule, Rfl: 3   folic acid (FOLVITE) 1 MG tablet, Take 1 tablet (1 mg total) by mouth daily., Disp: 90 tablet, Rfl: 1   hyoscyamine (LEVSIN SL) 0.125 MG SL tablet, PLACE 1-2 TABLETS UNDER THE TONGUE EVERY 6 HOURS AS NEEDED FOR CRAMPING, Disp: 30 tablet, Rfl: 0   ipratropium-albuterol (DUONEB) 0.5-2.5 (3) MG/3ML SOLN, Take 3 mLs by nebulization every 6 (six) hours as needed (Asthma)., Disp: , Rfl:    isosorbide mononitrate (IMDUR) 30 MG 24 hr tablet, TAKE 1 TABLET BY MOUTH EVERY DAY, Disp: 90 tablet, Rfl: 3   latanoprost (XALATAN) 0.005 % ophthalmic solution, Place 1 drop into both eyes at bedtime. , Disp: , Rfl:    ondansetron (ZOFRAN-ODT) 4 MG disintegrating tablet, Take 1 tablet (4 mg total)  by mouth every 8 (eight) hours as needed for nausea or vomiting. (Patient not taking: Reported on 08/10/2022), Disp: 30 tablet, Rfl: 1   osimertinib mesylate (TAGRISSO) 80 MG tablet, Take 1 tablet (80 mg total) by mouth daily., Disp: 30 tablet, Rfl: 4   prochlorperazine (COMPAZINE) 10 MG tablet, Take 1 tablet (10 mg total) by mouth every 6 (six) hours as needed., Disp: 30 tablet, Rfl: 2   promethazine (PHENERGAN) 12.5 MG tablet, Take 1 tablet (12.5 mg total) by mouth every 6 (six) hours as needed for nausea or vomiting., Disp: 30 tablet, Rfl: 2   REPATHA SURECLICK [id number] MG/ML  SOAJ, INJECT 140 MG INTO THE SKIN EVERY 14 (FOURTEEN) DAYS., Disp: 6 mL, Rfl: 3   valACYclovir (VALTREX) 500 MG tablet, , Disp: , Rfl:   Past Medical History: Past Medical History:  Diagnosis Date   Anemia    Anxiety    Arthritis    Asthma    exercise induced   Coronary artery disease    Depression    PMH   Dyspnea    GERD (gastroesophageal reflux disease)    Glaucoma    History of radiation therapy 01/05/2021   IMRT right lung  11/24/2020-01/05/2021  Dr [name]   Hypertension    lung ca dx'd 11/2017   right   Malignant pleural effusion    right   Nuclear sclerotic cataract of right eye 02/28/2021   Dr. Warden Fillers, cataract surgery February 2023   PONV (postoperative nausea and vomiting)    Pre-diabetes    Raynaud's disease    Raynaud's disease    Stroke ([place]) 01/2021   balance off, some express aphasia, weakness    Tobacco Use: Social History   Tobacco Use  Smoking Status Never  Smokeless Tobacco Never    Labs: Review Flowsheet  More data exists      Latest Ref Rng & Units 09/13/2020 11/01/2020 02/07/2021 02/12/2021 04/21/2021  Labs for ITP Cardiac and Pulmonary Rehab  Cholestrol 0 - 200 mg/dL 157  153  - 175  -  LDL (calc) 0 - 99 mg/dL 79  74  - 91  -  HDL-C >40 mg/dL 64  64  - 62  -  Trlycerides <150 mg/dL 73  77  - 110  -  Hemoglobin A1c 4.8 - 5.6 % - - - 5.7  -  Bicarbonate 20.0 - 28.0 mmol/L - - 23.4  - -  TCO2 22 - 32 mmol/L - - - - 26   Acid-base deficit 0.0 - 2.0 mmol/L - - 0.2  - -  O2 Saturation % - - 76.1  - -    Capillary Blood Glucose: Lab Results  Component Value Date   GLUCAP 94 04/21/2021   GLUCAP 88 11/09/2020   GLUCAP 87 12/26/2017     Pulmonary Assessment Scores:  Pulmonary Assessment Scores     Row Name 07/12/22 1055         ADL UCSD   SOB Score total 45       CAT Score   CAT Score 33       mMRC Score   mMRC Score 3             UCSD: Self-administered rating of dyspnea associated with activities of daily  living (ADLs) 6-point scale (0 = "not at all" to 5 = "maximal or unable to do because of breathlessness")  Scoring Scores range from 0 to 120.  Minimally important difference is 5 units  CAT:  CAT can identify the health impairment of COPD patients and is better correlated with disease progression.  CAT has a scoring range of zero to 40. The CAT score is classified into four groups of low (less than 10), medium (10 - 20), high (21-30) and very high (31-40) based on the impact level of disease on health status. A CAT score over 10 suggests significant symptoms.  A worsening CAT score could be explained by an exacerbation, poor medication adherence, poor inhaler technique, or progression of COPD or comorbid conditions.  CAT MCID is 2 points  mMRC: mMRC (Modified Medical Research Council) Dyspnea Scale is used to assess the degree of baseline functional disability in patients of respiratory disease due to dyspnea. No minimal important difference is established. A decrease in score of 1 point or greater is considered a positive change.   Pulmonary Function Assessment:  Pulmonary Function Assessment - 07/12/22 1133       Breath   Bilateral Breath Sounds Decreased;Rhonchi    Shortness of Breath Yes;Limiting activity             Exercise Target Goals: Exercise Program Goal: Individual exercise prescription set using results from initial 6 min walk test and THRR while considering  patient's activity barriers and safety.   Exercise Prescription Goal: Initial exercise prescription builds to 30-45 minutes a day of aerobic activity, 2-3 days per week.  Home exercise guidelines will be given to patient during program as part of exercise prescription that the participant will acknowledge.  Activity Barriers & Risk Stratification:  Activity Barriers & Cardiac Risk Stratification - 07/12/22 1053       Activity Barriers & Cardiac Risk Stratification   Activity Barriers Muscular  Weakness;Deconditioning;Shortness of Breath;Assistive Device;Balance Concerns             6 Minute Walk:  6 Minute Walk     Row Name 07/12/22 1154         6 Minute Walk   Phase Initial     Distance 920 feet     Walk Time 6 minutes     # of Rest Breaks 0     MPH 1.74     METS 2.3     RPE 12     Perceived Dyspnea  0.5     VO2 Peak 8.05     Symptoms No     Resting HR 82 bpm     Resting BP 130/60     Resting Oxygen Saturation  100 %     Exercise Oxygen Saturation  during 6 min walk 95 %     Max Ex. HR 94 bpm     Max Ex. BP 120/58     2 Minute Post BP 112/60       Interval HR   1 Minute HR 94     2 Minute HR 92     3 Minute HR 93     4 Minute HR 93     5 Minute HR 91     6 Minute HR 91     2 Minute Post HR 72     Interval Heart Rate? Yes       Interval Oxygen   Interval Oxygen? Yes     Baseline Oxygen Saturation % 100 %     1 Minute Oxygen Saturation % 95 %     1 Minute Liters of Oxygen 0 L     2 Minute Oxygen Saturation % 95 %     2  Minute Liters of Oxygen 0 L     3 Minute Oxygen Saturation % 97 %     3 Minute Liters of Oxygen 0 L     4 Minute Oxygen Saturation % 97 %     4 Minute Liters of Oxygen 0 L     5 Minute Oxygen Saturation % 96 %     5 Minute Liters of Oxygen 0 L     6 Minute Oxygen Saturation % 98 %     6 Minute Liters of Oxygen 0 L     2 Minute Post Oxygen Saturation % 97 %     2 Minute Post Liters of Oxygen 0 L              Oxygen Initial Assessment:  Oxygen Initial Assessment - 07/12/22 1054       Home Oxygen   Home Oxygen Device None    Sleep Oxygen Prescription None    Home Exercise Oxygen Prescription None    Home Resting Oxygen Prescription None      Initial 6 min Walk   Oxygen Used None      Program Oxygen Prescription   Program Oxygen Prescription None      Intervention   Short Term Goals To learn and understand importance of monitoring SPO2 with pulse oximeter and demonstrate accurate use of the pulse oximeter.;To  learn and understand importance of maintaining oxygen saturations>88%;To learn and demonstrate proper pursed lip breathing techniques or other breathing techniques. ;To learn and demonstrate proper use of respiratory medications    Long  Term Goals Verbalizes importance of monitoring SPO2 with pulse oximeter and return demonstration;Maintenance of O2 saturations>88%;Exhibits proper breathing techniques, such as pursed lip breathing or other method taught during program session;Compliance with respiratory medication;Demonstrates proper use of MDI's             Oxygen Re-Evaluation:  Oxygen Re-Evaluation     Row Name 07/24/22 1112 08/24/22 0929           Program Oxygen Prescription   Program Oxygen Prescription None None        Home Oxygen   Home Oxygen Device None None      Sleep Oxygen Prescription None None      Home Exercise Oxygen Prescription None None      Home Resting Oxygen Prescription None None        Goals/Expected Outcomes   Short Term Goals To learn and understand importance of monitoring SPO2 with pulse oximeter and demonstrate accurate use of the pulse oximeter.;To learn and understand importance of maintaining oxygen saturations>88%;To learn and demonstrate proper pursed lip breathing techniques or other breathing techniques. ;To learn and demonstrate proper use of respiratory medications To learn and understand importance of monitoring SPO2 with pulse oximeter and demonstrate accurate use of the pulse oximeter.;To learn and understand importance of maintaining oxygen saturations>88%;To learn and demonstrate proper pursed lip breathing techniques or other breathing techniques. ;To learn and demonstrate proper use of respiratory medications      Long  Term Goals Verbalizes importance of monitoring SPO2 with pulse oximeter and return demonstration;Maintenance of O2 saturations>88%;Exhibits proper breathing techniques, such as pursed lip breathing or other method taught during  program session;Compliance with respiratory medication;Demonstrates proper use of MDI's Verbalizes importance of monitoring SPO2 with pulse oximeter and return demonstration;Maintenance of O2 saturations>88%;Exhibits proper breathing techniques, such as pursed lip breathing or other method taught during program session;Compliance with respiratory medication;Demonstrates proper use of MDI's  Goals/Expected Outcomes Compliance and understanding of oxygen saturations monitoring and breathing techniques to decrease shortness of breath. Compliance and understanding of oxygen saturations monitoring and breathing techniques to decrease shortness of breath.               Oxygen Discharge (Final Oxygen Re-Evaluation):  Oxygen Re-Evaluation - 08/24/22 0929       Program Oxygen Prescription   Program Oxygen Prescription None      Home Oxygen   Home Oxygen Device None    Sleep Oxygen Prescription None    Home Exercise Oxygen Prescription None    Home Resting Oxygen Prescription None      Goals/Expected Outcomes   Short Term Goals To learn and understand importance of monitoring SPO2 with pulse oximeter and demonstrate accurate use of the pulse oximeter.;To learn and understand importance of maintaining oxygen saturations>88%;To learn and demonstrate proper pursed lip breathing techniques or other breathing techniques. ;To learn and demonstrate proper use of respiratory medications    Long  Term Goals Verbalizes importance of monitoring SPO2 with pulse oximeter and return demonstration;Maintenance of O2 saturations>88%;Exhibits proper breathing techniques, such as pursed lip breathing or other method taught during program session;Compliance with respiratory medication;Demonstrates proper use of MDI's    Goals/Expected Outcomes Compliance and understanding of oxygen saturations monitoring and breathing techniques to decrease shortness of breath.             Initial Exercise Prescription:   Initial Exercise Prescription - 07/12/22 1100       Date of Initial Exercise RX and Referring Provider   Date 07/12/22    Referring Provider Icard    Expected Discharge Date 09/14/22      NuStep   Level 1    SPM 60    Minutes 15      Track   Minutes 15    METs 2.3      Prescription Details   Frequency (times per week) 2    Duration Progress to 30 minutes of continuous aerobic without signs/symptoms of physical distress      Intensity   THRR 40-80% of Max Heartrate 60-119    Ratings of Perceived Exertion 11-13    Perceived Dyspnea 0-4      Progression   Progression Continue to progress workloads to maintain intensity without signs/symptoms of physical distress.      Resistance Training   Training Prescription Yes    Weight red bands    Reps 10-15             Perform Capillary Blood Glucose checks as needed.  Exercise Prescription Changes:   Exercise Prescription Changes     Row Name 07/18/22 1500 08/01/22 1600 08/03/22 1600 08/10/22 1527 08/24/22 1500     Response to Exercise   Blood Pressure (Admit) 150/76 120/64 -- 152/68 148/60   Blood Pressure (Exercise) 138/72 118/60 -- -- 122/65   Blood Pressure (Exit) 128/70 112/66 -- 112/62 118/66   Heart Rate (Admit) 79 bpm 80 bpm -- 72 bpm 91 bpm   Heart Rate (Exercise) 83 bpm 84 bpm -- -- 88 bpm   Heart Rate (Exit) 87 bpm 91 bpm -- 73 bpm 100 bpm   Oxygen Saturation (Admit) 100 % 100 % -- 100 % 99 %   Oxygen Saturation (Exercise) 100 % 99 % -- -- 100 %   Oxygen Saturation (Exit) 97 % 99 % -- 100 % 96 %   Rating of Perceived Exertion (Exercise) 14 12 -- 12 11   Perceived  Dyspnea (Exercise) 2 1 -- 1 1   Duration Continue with 30 min of aerobic exercise without signs/symptoms of physical distress. Continue with 30 min of aerobic exercise without signs/symptoms of physical distress. -- Continue with 30 min of aerobic exercise without signs/symptoms of physical distress. Continue with 30 min of aerobic exercise without  signs/symptoms of physical distress.   Intensity THRR unchanged THRR unchanged -- THRR unchanged THRR unchanged     Progression   Progression Continue to progress workloads to maintain intensity without signs/symptoms of physical distress. Continue to progress workloads to maintain intensity without signs/symptoms of physical distress. -- Continue to progress workloads to maintain intensity without signs/symptoms of physical distress. Continue to progress workloads to maintain intensity without signs/symptoms of physical distress.     Resistance Training   Training Prescription Yes Yes -- Yes Yes   Weight red bands red bands -- red bands red bands   Reps 10-15 10-15 -- 10-15 10-15   Time 10 Minutes 10 Minutes -- 10 Minutes 10 Minutes     NuStep   Level 1 1 -- 3 4   SPM 60 60 -- 60 60   Minutes 15 15 -- 15 15   METs 1.2 1.7 -- 1.7 2.2     Track   Laps 7 7 -- 11 7   Minutes 15 15 -- 15 15   METs 2.62 2.62 -- 2.69 2.08     Home Exercise Plan   Plans to continue exercise at -- -- Home (comment)  walking program -- --   Frequency -- -- --  n/a -- --   Initial Home Exercises Provided -- -- 08/03/22 -- --            Exercise Comments:   Exercise Comments     Row Name 07/18/22 1528 08/03/22 1641         Exercise Comments Pt completed first day of exercise. She walked on the track for 15 min, 2.08 METs. She then exercised on the nustep for 15 min, level 1, METs 1.2. Tolerated well. Performed warmup and coold down standing with verbal cues. Discussed METs with good understanding. Will progress as tolerated. Discussed home exercise plan today. She is currently doing a 15 min virtual walking program most days of the week. Encouraged her to add walking outside 15 min+ when the weather allows to work to 30 min.               Exercise Goals and Review:   Exercise Goals     Row Name 07/12/22 1158 07/24/22 1109 08/24/22 0925         Exercise Goals   Increase Physical  Activity Yes Yes Yes     Intervention Provide advice, education, support and counseling about physical activity/exercise needs.;Develop an individualized exercise prescription for aerobic and resistive training based on initial evaluation findings, risk stratification, comorbidities and participant's personal goals. Provide advice, education, support and counseling about physical activity/exercise needs.;Develop an individualized exercise prescription for aerobic and resistive training based on initial evaluation findings, risk stratification, comorbidities and participant's personal goals. Provide advice, education, support and counseling about physical activity/exercise needs.;Develop an individualized exercise prescription for aerobic and resistive training based on initial evaluation findings, risk stratification, comorbidities and participant's personal goals.     Expected Outcomes Short Term: Attend rehab on a regular basis to increase amount of physical activity.;Long Term: Add in home exercise to make exercise part of routine and to increase amount of physical activity.;Long Term: Exercising  regularly at least 3-5 days a week. Short Term: Attend rehab on a regular basis to increase amount of physical activity.;Long Term: Add in home exercise to make exercise part of routine and to increase amount of physical activity.;Long Term: Exercising regularly at least 3-5 days a week. Short Term: Attend rehab on a regular basis to increase amount of physical activity.;Long Term: Add in home exercise to make exercise part of routine and to increase amount of physical activity.;Long Term: Exercising regularly at least 3-5 days a week.     Increase Strength and Stamina Yes Yes Yes     Intervention Provide advice, education, support and counseling about physical activity/exercise needs.;Develop an individualized exercise prescription for aerobic and resistive training based on initial evaluation findings, risk  stratification, comorbidities and participant's personal goals. Provide advice, education, support and counseling about physical activity/exercise needs.;Develop an individualized exercise prescription for aerobic and resistive training based on initial evaluation findings, risk stratification, comorbidities and participant's personal goals. Provide advice, education, support and counseling about physical activity/exercise needs.;Develop an individualized exercise prescription for aerobic and resistive training based on initial evaluation findings, risk stratification, comorbidities and participant's personal goals.     Expected Outcomes Short Term: Increase workloads from initial exercise prescription for resistance, speed, and METs.;Short Term: Perform resistance training exercises routinely during rehab and add in resistance training at home;Long Term: Improve cardiorespiratory fitness, muscular endurance and strength as measured by increased METs and functional capacity (6MWT) Short Term: Increase workloads from initial exercise prescription for resistance, speed, and METs.;Short Term: Perform resistance training exercises routinely during rehab and add in resistance training at home;Long Term: Improve cardiorespiratory fitness, muscular endurance and strength as measured by increased METs and functional capacity (6MWT) Short Term: Increase workloads from initial exercise prescription for resistance, speed, and METs.;Short Term: Perform resistance training exercises routinely during rehab and add in resistance training at home;Long Term: Improve cardiorespiratory fitness, muscular endurance and strength as measured by increased METs and functional capacity (6MWT)     Able to understand and use rate of perceived exertion (RPE) scale Yes Yes Yes     Intervention Provide education and explanation on how to use RPE scale Provide education and explanation on how to use RPE scale Provide education and explanation  on how to use RPE scale     Expected Outcomes Short Term: Able to use RPE daily in rehab to express subjective intensity level;Long Term:  Able to use RPE to guide intensity level when exercising independently Short Term: Able to use RPE daily in rehab to express subjective intensity level;Long Term:  Able to use RPE to guide intensity level when exercising independently Short Term: Able to use RPE daily in rehab to express subjective intensity level;Long Term:  Able to use RPE to guide intensity level when exercising independently     Able to understand and use Dyspnea scale Yes Yes Yes     Intervention Provide education and explanation on how to use Dyspnea scale Provide education and explanation on how to use Dyspnea scale Provide education and explanation on how to use Dyspnea scale     Expected Outcomes Short Term: Able to use Dyspnea scale daily in rehab to express subjective sense of shortness of breath during exertion;Long Term: Able to use Dyspnea scale to guide intensity level when exercising independently Short Term: Able to use Dyspnea scale daily in rehab to express subjective sense of shortness of breath during exertion;Long Term: Able to use Dyspnea scale to guide intensity level when  exercising independently Short Term: Able to use Dyspnea scale daily in rehab to express subjective sense of shortness of breath during exertion;Long Term: Able to use Dyspnea scale to guide intensity level when exercising independently     Knowledge and understanding of Target Heart Rate Range (THRR) Yes Yes Yes     Intervention Provide education and explanation of THRR including how the numbers were predicted and where they are located for reference Provide education and explanation of THRR including how the numbers were predicted and where they are located for reference Provide education and explanation of THRR including how the numbers were predicted and where they are located for reference     Expected  Outcomes Short Term: Able to state/look up THRR;Long Term: Able to use THRR to govern intensity when exercising independently;Short Term: Able to use daily as guideline for intensity in rehab Short Term: Able to state/look up THRR;Long Term: Able to use THRR to govern intensity when exercising independently;Short Term: Able to use daily as guideline for intensity in rehab Short Term: Able to state/look up THRR;Long Term: Able to use THRR to govern intensity when exercising independently;Short Term: Able to use daily as guideline for intensity in rehab     Understanding of Exercise Prescription Yes Yes Yes     Intervention Provide education, explanation, and written materials on patient's individual exercise prescription Provide education, explanation, and written materials on patient's individual exercise prescription Provide education, explanation, and written materials on patient's individual exercise prescription     Expected Outcomes Short Term: Able to explain program exercise prescription;Long Term: Able to explain home exercise prescription to exercise independently Short Term: Able to explain program exercise prescription;Long Term: Able to explain home exercise prescription to exercise independently Short Term: Able to explain program exercise prescription;Long Term: Able to explain home exercise prescription to exercise independently              Exercise Goals Re-Evaluation :  Exercise Goals Re-Evaluation     Row Name 07/24/22 1109 08/24/22 0925           Exercise Goal Re-Evaluation   Exercise Goals Review Increase Physical Activity;Able to understand and use Dyspnea scale;Understanding of Exercise Prescription;Increase Strength and Stamina;Knowledge and understanding of Target Heart Rate Range (THRR);Able to understand and use rate of perceived exertion (RPE) scale Increase Physical Activity;Able to understand and use Dyspnea scale;Understanding of Exercise Prescription;Increase  Strength and Stamina;Knowledge and understanding of Target Heart Rate Range (THRR);Able to understand and use rate of perceived exertion (RPE) scale      Comments Pt has completed 2 days of exercise.  Jazzmen is undergoing chemo and therefore will miss a session every couple of weeks (will miss tomorrow). She is walking on the track for 15 min, 1.92 METs last session. She then is exercising on the nustep for 15 min, level 1, METs 1.5. Tolerated well. Performed warmup and cool down standing with verbal cues. Will progress as tolerated. Pt has completed 9 days of exercise.  Madeleine is undergoing chemo and has to miss exercise occasionally. Otherwise she is motivated and has good attendance. She is walking on the track for 15 min, 2.08 METs. She then is exercising on the nustep for 15 min, level 4, METs 2.2. She tolerates well and performs warmup and cooldown independently. Will progress as tolerated.      Expected Outcomes Through exercise at rehab and home, the patient will decrease shortness of breath with daily activities and feel confident in carrying out an exercise  regimen at home. Through exercise at rehab and home, the patient will decrease shortness of breath with daily activities and feel confident in carrying out an exercise regimen at home.               Discharge Exercise Prescription (Final Exercise Prescription Changes):  Exercise Prescription Changes - 08/24/22 1500       Response to Exercise   Blood Pressure (Admit) 148/60    Blood Pressure (Exercise) 122/65    Blood Pressure (Exit) 118/66    Heart Rate (Admit) 91 bpm    Heart Rate (Exercise) 88 bpm    Heart Rate (Exit) 100 bpm    Oxygen Saturation (Admit) 99 %    Oxygen Saturation (Exercise) 100 %    Oxygen Saturation (Exit) 96 %    Rating of Perceived Exertion (Exercise) 11    Perceived Dyspnea (Exercise) 1    Duration Continue with 30 min of aerobic exercise without signs/symptoms of physical distress.    Intensity THRR  unchanged      Progression   Progression Continue to progress workloads to maintain intensity without signs/symptoms of physical distress.      Resistance Training   Training Prescription Yes    Weight red bands    Reps 10-15    Time 10 Minutes      NuStep   Level 4    SPM 60    Minutes 15    METs 2.2      Track   Laps 7    Minutes 15    METs 2.08             Nutrition:  Target Goals: Understanding of nutrition guidelines, daily intake of sodium '1500mg'$ , cholesterol '200mg'$ , calories 30% from fat and 7% or less from saturated fats, daily to have 5 or more servings of fruits and vegetables.  Biometrics:    Nutrition Therapy Plan and Nutrition Goals:  Nutrition Therapy & Goals - 08/17/22 1443       Nutrition Therapy   Diet General Healthy Diet      Personal Nutrition Goals   Nutrition Goal Patient to identify strategies for weight gain of 0.5-2.0# per week    Comments Murphee has been working on weight gain over the last several months with the oncology team; her lowest weight was ~113#. She has used supplements in the past but does not consistently choose them now. Answered patient questions regarding nausea, eating frequency for weight gain, protein intake, adding calories from fat (avocado, nut butters, etc). She has maintained her weight since starting wiht our program. She continues cancer treatment. Her daughters remain an excellent support to her. Jekia will continue to benefit from participation in pulmonary rehab for nutrition, exercise, and lifestyle modification.      Intervention Plan   Intervention Prescribe, educate and counsel regarding individualized specific dietary modifications aiming towards targeted core components such as weight, hypertension, lipid management, diabetes, heart failure and other comorbidities.;Nutrition handout(s) given to patient.    Expected Outcomes Short Term Goal: Understand basic principles of dietary content, such as calories,  fat, sodium, cholesterol and nutrients.;Long Term Goal: Adherence to prescribed nutrition plan.             Nutrition Assessments:  Nutrition Assessments - 07/18/22 1613       Rate Your Plate Scores   Pre Score 56            MEDIFICTS Score Key: ?70 Need to make dietary changes  40-70 Heart Healthy  Diet ? 40 Therapeutic Level Cholesterol Diet  Flowsheet Row PULMONARY REHAB OTHER RESPIRATORY from 07/18/2022 in [facility] for Heart, Vascular, & Lung Health  Picture Your Plate Total Score on Admission 56      Picture Your Plate Scores: [id number] Unhealthy dietary pattern with much room for improvement. 41-50 Dietary pattern unlikely to meet recommendations for good health and room for improvement. 51-60 More healthful dietary pattern, with some room for improvement.  >60 Healthy dietary pattern, although there may be some specific behaviors that could be improved.    Nutrition Goals Re-Evaluation:  Nutrition Goals Re-Evaluation     [place] Name 07/18/22 1520 08/17/22 1443           Goals   Current Weight 124 lb 5.4 oz (56.4 kg) 124 lb 1.9 oz (56.3 kg)      Comment hemoglobin 9.7, hematocrit 30.4 AST 46, GFR 53, Cr 1.12      Expected Outcome Verva has been working on weight loss over the last several months with the oncology team; her lowest weight was ~113#. She has used supplements in the past but does not consistently choose them now. She does eat out often and "grazes" throughout the day. Answered patient questions regarding nausea, eating frequency for weight gain, protein intake, adding calories from fat (avocado, nut butters, etc). Ossie will continue to benefit from participation in pulmonary rehab for nutrition, exercise, and lifestyle modification. Haru has been working on weight gain over the last several months with the oncology team; her lowest weight was ~113#. She has used supplements in the past but does not consistently choose them now.  Answered patient questions regarding nausea, eating frequency for weight gain, protein intake, adding calories from fat (avocado, nut butters, etc). She has maintained her weight since starting with our program. She continues cancer treatment. Her daughters remain an excellent support to her. Lilybeth will continue to benefit from participation in pulmonary rehab for nutrition, exercise, and lifestyle modification.               Nutrition Goals Discharge (Final Nutrition Goals Re-Evaluation):  Nutrition Goals Re-Evaluation - 08/17/22 1443       Goals   Current Weight 124 lb 1.9 oz (56.3 kg)    Comment AST 46, GFR 53, Cr 1.12    Expected Outcome Keelan has been working on weight gain over the last several months with the oncology team; her lowest weight was ~113#. She has used supplements in the past but does not consistently choose them now. Answered patient questions regarding nausea, eating frequency for weight gain, protein intake, adding calories from fat (avocado, nut butters, etc). She has maintained her weight since starting with our program. She continues cancer treatment. Her daughters remain an excellent support to her. Dorothye will continue to benefit from participation in pulmonary rehab for nutrition, exercise, and lifestyle modification.             Psychosocial: Target Goals: Acknowledge presence or absence of significant depression and/or stress, maximize coping skills, provide positive support system. Participant is able to verbalize types and ability to use techniques and skills needed for reducing stress and depression.  Initial Review & Psychosocial Screening:  Initial Psych Review & Screening - 07/12/22 1048       Initial Review   Current issues with Current Anxiety/Panic      Family Dynamics   Good Support System? Yes    Comments Patient has a Social worker and feels anxiety has improved  Barriers   Psychosocial barriers to participate in program There are no  identifiable barriers or psychosocial needs.      Screening Interventions   Interventions Encouraged to exercise             Quality of Life Scores:  Scores of 19 and below usually indicate a poorer quality of life in these areas.  A difference of  2-3 points is a clinically meaningful difference.  A difference of 2-3 points in the total score of the Quality of Life Index has been associated with significant improvement in overall quality of life, self-image, physical symptoms, and general health in studies assessing change in quality of life.  PHQ-9: Review Flowsheet  More data exists      07/12/2022 02/02/2021 12/29/2019 10/21/2019 09/17/2019  Depression screen PHQ 2/9  Decreased Interest 0 1 1 0 0  Down, Depressed, Hopeless 0 2 1 0 0  PHQ - 2 Score 0 3 2 0 0  Altered sleeping '1 3 3 '$ - 0  Tired, decreased energy 0 3 2 - 0  Change in appetite 0 2 0 - 0  Feeling bad or failure about yourself  1 0 0 - 0  Trouble concentrating 0 1 1 - 0  Moving slowly or fidgety/restless 0 3 0 - 0  Suicidal thoughts 0 0 0 - 0  PHQ-9 Score '2 15 8 '$ - 0  Difficult doing work/chores Not difficult at all - - - -   Interpretation of Total Score  Total Score Depression Severity:  1-4 = Minimal depression, 5-9 = Mild depression, 10-14 = Moderate depression, 15-19 = Moderately severe depression, 20-27 = Severe depression   Psychosocial Evaluation and Intervention:  Psychosocial Evaluation - 07/12/22 1049       Psychosocial Evaluation & Interventions   Interventions Encouraged to exercise with the program and follow exercise prescription    Comments pt denies psychosocial barriers    Expected Outcomes Pt will benefit from PR without any psychosocial barriers or concerns    Continue Psychosocial Services  No Follow up required             Psychosocial Re-Evaluation:  Psychosocial Re-Evaluation     Row Name 07/28/22 1550 08/23/22 1337           Psychosocial Re-Evaluation   Current issues with  Current Anxiety/Panic;Current Psychotropic Meds;Current Sleep Concerns Current Anxiety/Panic;Current Psychotropic Meds      Comments Luvia is currently fighting stage 4 lung cancer and getting chemotherapy every 2 weeks. She gets her therapy on Thursdays and states it takes her about 5-7 days to recuperate afterwards. She is worried about her diagnosis/health and her shortness of breath both give her anxiety. She is compliant with taking her psychotropic meds and feels like exercising is also helping. She states she has great support from her daughter. Italee denies any needs currently, but we will continue to follow and assess. Freyda is still getting chemo for her lung cancer and her recovery keeps her out of class for the following couple days. Haydee states her anxiety is at a minimum. She says she has come to terms with her diagnosis and has learned to accept it. She states her counselor has been very helpful and Carlise uses a mantra to get through rough times. Ronata denies any needs at this time. We will continue to follow if any psychosocial needs arise.      Expected Outcomes For Quinnlyn to continue PR without any psychosocial barriers or concerns. For Newell to  continue PR without any psychosocial barriers or concerns.      Interventions Encouraged to attend Pulmonary Rehabilitation for the exercise Encouraged to attend Pulmonary Rehabilitation for the exercise      Continue Psychosocial Services  Follow up required by staff  We will continue to monitor and assess Anjolie for any needs that may arise Follow up required by staff  We will continue to monitor and assess Brilee for any needs that may arise               Psychosocial Discharge (Final Psychosocial Re-Evaluation):  Psychosocial Re-Evaluation - 08/23/22 1337       Psychosocial Re-Evaluation   Current issues with Current Anxiety/Panic;Current Psychotropic Meds    Comments Dianelly is still getting chemo for her lung cancer and her recovery  keeps her out of class for the following couple days. Malasia states her anxiety is at a minimum. She says she has come to terms with her diagnosis and has learned to accept it. She states her counselor has been very helpful and Lorryn uses a mantra to get through rough times. Jamora denies any needs at this time. We will continue to follow if any psychosocial needs arise.    Expected Outcomes For Suhayla to continue PR without any psychosocial barriers or concerns.    Interventions Encouraged to attend Pulmonary Rehabilitation for the exercise    Continue Psychosocial Services  Follow up required by staff   We will continue to monitor and assess Jewel for any needs that may arise            Education: Education Goals: Education classes will be provided on a weekly basis, covering required topics. Participant will state understanding/return demonstration of topics presented.  Learning Barriers/Preferences:  Learning Barriers/Preferences - 07/12/22 1051       Learning Barriers/Preferences   Learning Barriers Sight    Learning Preferences Skilled Demonstration;Pictoral;Written Material             Education Topics: Introduction to Pulmonary Rehab Group instruction provided by PowerPoint, verbal discussion, and written material to support subject matter. Instructor reviews what Pulmonary Rehab is, the purpose of the program, and how patients are referred.     Know Your Numbers Group instruction that is supported by a PowerPoint presentation. Instructor discusses importance of knowing and understanding resting, exercise, and post-exercise oxygen saturation, heart rate, and blood pressure. Oxygen saturation, heart rate, blood pressure, rating of perceived exertion, and dyspnea are reviewed along with a normal range for these values.    Exercise for the Pulmonary Patient Group instruction that is supported by a PowerPoint presentation. Instructor discusses benefits of exercise, core  components of exercise, frequency, duration, and intensity of an exercise routine, importance of utilizing pulse oximetry during exercise, safety while exercising, and options of places to exercise outside of rehab.    MET Level  Group instruction provided by PowerPoint, verbal discussion, and written material to support subject matter. Instructor reviews what METs are and how to increase METs.  Flowsheet Row PULMONARY REHAB OTHER RESPIRATORY from 07/18/2022 in [facility] for Heart, Vascular, & [place]  Date 07/18/22  Educator EP  Instruction Review Code 1- Verbalizes Understanding       Pulmonary Medications Verbally interactive group education provided by instructor with focus on inhaled medications and proper administration.   Anatomy and Physiology of the Respiratory System Group instruction provided by PowerPoint, verbal discussion, and written material to support subject matter. Instructor reviews respiratory cycle and  anatomical components of the respiratory system and their functions. Instructor also reviews differences in obstructive and restrictive respiratory diseases with examples of each.  Flowsheet Row PULMONARY REHAB OTHER RESPIRATORY from 08/24/2022 in [facility] for Heart, Vascular, & Lung Health  Date 08/24/22  Educator EP  Instruction Review Code 1- Verbalizes Understanding       Oxygen Safety Group instruction provided by PowerPoint, verbal discussion, and written material to support subject matter. There is an overview of "What is Oxygen" and "Why do we need it".  Instructor also reviews how to create a safe environment for oxygen use, the importance of using oxygen as prescribed, and the risks of noncompliance. There is a brief discussion on traveling with oxygen and resources the patient may utilize.   Oxygen Use Group instruction provided by PowerPoint, verbal discussion, and written material to discuss how  supplemental oxygen is prescribed and different types of oxygen supply systems. Resources for more information are provided.  Flowsheet Row PULMONARY REHAB OTHER RESPIRATORY from 07/20/2022 in [facility] for Heart, Vascular, & Lung Health  Date 07/20/22  Educator RT  Instruction Review Code 1- Verbalizes Understanding       Breathing Techniques Group instruction that is supported by demonstration and informational handouts. Instructor discusses the benefits of pursed lip and diaphragmatic breathing and detailed demonstration on how to perform both.  Flowsheet Row PULMONARY REHAB OTHER RESPIRATORY from 07/27/2022 in [facility] for Heart, Vascular, & Lung Health  Date 07/27/22  Educator EP  Instruction Review Code 1- Verbalizes Understanding        Risk Factor Reduction Group instruction that is supported by a PowerPoint presentation. Instructor discusses the definition of a risk factor, different risk factors for pulmonary disease, and how the heart and lungs work together.   MD Day A group question and answer session with a medical doctor that allows participants to ask questions that relate to their pulmonary disease state.   Nutrition for the Pulmonary Patient Group instruction provided by PowerPoint slides, verbal discussion, and written materials to support subject matter. The instructor gives an explanation and review of healthy diet recommendations, which includes a discussion on weight management, recommendations for fruit and vegetable consumption, as well as protein, fluid, caffeine, fiber, sodium, sugar, and alcohol. Tips for eating when patients are short of breath are discussed.    Other Education Group or individual verbal, written, or video instructions that support the educational goals of the pulmonary rehab program. Flowsheet Row PULMONARY REHAB OTHER RESPIRATORY from 08/10/2022 in [facility] for  Heart, Vascular, & [place]  Date 08/10/22  Educator RT  Instruction Review Code 1- Verbalizes Understanding        Knowledge Questionnaire Score:   Core Components/Risk Factors/Patient Goals at Admission:  Personal Goals and Risk Factors at Admission - 08/23/22 1339       Core Components/Risk Factors/Patient Goals on Admission    Weight Management Yes;Weight Gain    Intervention Weight Management: Develop a combined nutrition and exercise program designed to reach desired caloric intake, while maintaining appropriate intake of nutrient and fiber, sodium and fats, and appropriate energy expenditure required for the weight goal.;Weight Management: Provide education and appropriate resources to help participant work on and attain dietary goals.    Expected Outcomes Short Term: Continue to assess and modify interventions until short term weight is achieved;Long Term: Adherence to nutrition and physical activity/exercise program aimed toward attainment of established weight  goal;Weight Maintenance: Understanding of the daily nutrition guidelines, which includes 25-35% calories from fat, 7% or less cal from saturated fats, less than '200mg'$  cholesterol, less than 1.5gm of sodium, & 5 or more servings of fruits and vegetables daily;Understanding recommendations for meals to include 15-35% energy as protein, 25-35% energy from fat, 35-60% energy from carbohydrates, less than '200mg'$  of dietary cholesterol, 20-35 gm of total fiber daily;Understanding of distribution of calorie intake throughout the day with the consumption of 4-5 meals/snacks;Weight Gain: Understanding of general recommendations for a high calorie, high protein meal plan that promotes weight gain by distributing calorie intake throughout the day with the consumption for 4-5 meals, snacks, and/or supplements    Improve shortness of breath with ADL's Yes    Intervention Provide education, individualized exercise plan and daily activity  instruction to help decrease symptoms of SOB with activities of daily living.    Expected Outcomes Short Term: Improve cardiorespiratory fitness to achieve a reduction of symptoms when performing ADLs;Long Term: Be able to perform more ADLs without symptoms or delay the onset of symptoms    Increase knowledge of respiratory medications and ability to use respiratory devices properly  Yes    Intervention Provide education and demonstration as needed of appropriate use of medications, inhalers, and oxygen therapy.    Expected Outcomes Short Term: Achieves understanding of medications use. Understands that oxygen is a medication prescribed by physician. Demonstrates appropriate use of inhaler and oxygen therapy.;Long Term: Maintain appropriate use of medications, inhalers, and oxygen therapy.             Core Components/Risk Factors/Patient Goals Review:   Goals and Risk Factor Review     Row Name 07/28/22 1556 08/23/22 1344           Core Components/Risk Factors/Patient Goals Review   Personal Goals Review Other;Stress;Increase knowledge of respiratory medications and ability to use respiratory devices properly.;Develop more efficient breathing techniques such as purse lipped breathing and diaphragmatic breathing and practicing self-pacing with activity.;Improve shortness of breath with ADL's  Weight Gain Other;Stress;Increase knowledge of respiratory medications and ability to use respiratory devices properly.;Develop more efficient breathing techniques such as purse lipped breathing and diaphragmatic breathing and practicing self-pacing with activity.;Improve shortness of breath with ADL's  Weight Gain      Review Kristeen is very motivated when she attends PR class. She has a positive personality and really enjoys coming to class to build up her endurance and stamina. She is currently walking the track and exercising on the NuStep. She is increasing her workload and METs on the NuStep. Izzabel can  report her rate of perceived exertion and her dyspnea level to staff. She has attended the breathing techniques class. She states that she can already feel a difference. Chrystian has attended 9 classes thus far. She is exercising by walking the track. Her laps have stayed about the same, no improvement. Alyisa is also working out on [employer]. She is increasing her workload from 1 to 4 and her METs from 1.2 to 2.2. Wm can report her rate of perceived exertion and her dyspnea level to staff. She has attended the breathing techniques, stress and energy conservation, oxygen use, and warning signs and symptoms classes. She states that she can already feel a difference. Audrena has correctly demonstrated when to use her inhaler and how to use it to our respiratory therapist. She has not gained any weight from starting the program but is still working with our dietician. Lyndi likes coming to  class and being around others going through the same things.      Expected Outcomes See Admission Goals See Admission Goals               Core Components/Risk Factors/Patient Goals at Discharge (Final Review):   Goals and Risk Factor Review - 08/23/22 1344       Core Components/Risk Factors/Patient Goals Review   Personal Goals Review Other;Stress;Increase knowledge of respiratory medications and ability to use respiratory devices properly.;Develop more efficient breathing techniques such as purse lipped breathing and diaphragmatic breathing and practicing self-pacing with activity.;Improve shortness of breath with ADL's   Weight Gain   Review Abigailrose has attended 9 classes thus far. She is exercising by walking the track. Her laps have stayed about the same, no improvement. Jameisha is also working out on [employer]. She is increasing her workload from 1 to 4 and her METs from 1.2 to 2.2. Debrianna can report her rate of perceived exertion and her dyspnea level to staff. She has attended the breathing techniques, stress and  energy conservation, oxygen use, and warning signs and symptoms classes. She states that she can already feel a difference. Aayat has correctly demonstrated when to use her inhaler and how to use it to our respiratory therapist. She has not gained any weight from starting the program but is still working with our dietician. Praise likes coming to class and being around others going through the same things.    Expected Outcomes See Admission Goals             ITP Comments:   Comments: Dr. Rodman Pickle is Medical Director for Pulmonary Rehab at [facility].

## 2022-08-31 ENCOUNTER — Other Ambulatory Visit: Payer: Self-pay | Admitting: Physician Assistant

## 2022-08-31 ENCOUNTER — Inpatient Hospital Stay: Payer: Medicare HMO

## 2022-08-31 ENCOUNTER — Ambulatory Visit: Payer: Medicare HMO

## 2022-08-31 ENCOUNTER — Inpatient Hospital Stay: Payer: Medicare HMO | Attending: Internal Medicine | Admitting: Physician Assistant

## 2022-08-31 ENCOUNTER — Encounter (HOSPITAL_COMMUNITY)
Admission: RE | Admit: 2022-08-31 | Discharge: 2022-08-31 | Disposition: A | Discharge status: Home / Self Care | Payer: Medicare HMO | Source: Ambulatory Visit | Attending: Pulmonary Disease | Admitting: Pulmonary Disease

## 2022-08-31 ENCOUNTER — Other Ambulatory Visit: Payer: Medicare HMO

## 2022-08-31 ENCOUNTER — Ambulatory Visit: Payer: Medicare HMO | Admitting: Internal Medicine

## 2022-08-31 VITALS — BP 161/74 | HR 69 | Resp 16

## 2022-08-31 VITALS — BP 150/72 | HR 67 | Temp 100.0°F | Resp 16 | Wt 127.8 lb

## 2022-08-31 DIAGNOSIS — Z5111 Encounter for antineoplastic chemotherapy: Secondary | ICD-10-CM | POA: Diagnosis not present

## 2022-08-31 DIAGNOSIS — C3491 Malignant neoplasm of unspecified part of right bronchus or lung: Secondary | ICD-10-CM

## 2022-08-31 DIAGNOSIS — Z95828 Presence of other vascular implants and grafts: Secondary | ICD-10-CM

## 2022-08-31 DIAGNOSIS — Z79899 Other long term (current) drug therapy: Secondary | ICD-10-CM | POA: Diagnosis not present

## 2022-08-31 DIAGNOSIS — R5383 Other fatigue: Secondary | ICD-10-CM | POA: Insufficient documentation

## 2022-08-31 DIAGNOSIS — J479 Bronchiectasis, uncomplicated: Secondary | ICD-10-CM | POA: Diagnosis not present

## 2022-08-31 DIAGNOSIS — C3411 Malignant neoplasm of upper lobe, right bronchus or lung: Secondary | ICD-10-CM | POA: Insufficient documentation

## 2022-08-31 DIAGNOSIS — C7931 Secondary malignant neoplasm of brain: Secondary | ICD-10-CM | POA: Insufficient documentation

## 2022-08-31 LAB — CMP (CANCER CENTER ONLY)
ALT: 33 U/L (ref 0–44)
AST: 32 U/L (ref 15–41)
Albumin: 3.8 g/dL (ref 3.5–5.0)
Alkaline Phosphatase: 65 U/L (ref 38–126)
Anion gap: 6 (ref 5–15)
BUN: 21 mg/dL (ref 8–23)
CO2: 27 mmol/L (ref 22–32)
Calcium: 9.2 mg/dL (ref 8.9–10.3)
Chloride: 109 mmol/L (ref 98–111)
Creatinine: 1.19 mg/dL — ABNORMAL HIGH (ref 0.44–1.00)
GFR, Estimated: 49 mL/min — ABNORMAL LOW (ref >60)
Glucose, Bld: 110 mg/dL — ABNORMAL HIGH (ref 70–99)
Potassium: 3.4 mmol/L — ABNORMAL LOW (ref 3.5–5.1)
Sodium: 142 mmol/L (ref 135–145)
Total Bilirubin: 0.2 mg/dL — ABNORMAL LOW (ref 0.3–1.2)
Total Protein: 6.9 g/dL (ref 6.5–8.1)

## 2022-08-31 LAB — CBC WITH DIFFERENTIAL (CANCER CENTER ONLY)
Abs Immature Granulocytes: 0 10*3/uL (ref 0.00–0.07)
Basophils Absolute: 0 10*3/uL (ref 0.0–0.1)
Basophils Relative: 1 %
Eosinophils Absolute: 0 10*3/uL (ref 0.0–0.5)
Eosinophils Relative: 1 %
HCT: 31.3 % — ABNORMAL LOW (ref 36.0–46.0)
Hemoglobin: 10.2 g/dL — ABNORMAL LOW (ref 12.0–15.0)
Immature Granulocytes: 0 %
Lymphocytes Relative: 25 %
Lymphs Abs: 0.9 10*3/uL (ref 0.7–4.0)
MCH: 28.4 pg (ref 26.0–34.0)
MCHC: 32.6 g/dL (ref 30.0–36.0)
MCV: 87.2 fL (ref 80.0–100.0)
Monocytes Absolute: 0.5 10*3/uL (ref 0.1–1.0)
Monocytes Relative: 13 %
Neutro Abs: 2.3 10*3/uL (ref 1.7–7.7)
Neutrophils Relative %: 60 %
Platelet Count: 216 10*3/uL (ref 150–400)
RBC: 3.59 MIL/uL — ABNORMAL LOW (ref 3.87–5.11)
RDW: 14.9 % (ref 11.5–15.5)
WBC Count: 3.8 10*3/uL — ABNORMAL LOW (ref 4.0–10.5)
nRBC: 0 % (ref 0.0–0.2)

## 2022-08-31 MED ORDER — HEPARIN SOD (PORK) LOCK FLUSH 100 UNIT/ML IV SOLN
500.0000 [IU] | Freq: Once | INTRAVENOUS | Status: AC | PRN
  Administered 2022-08-31: 500 [IU]

## 2022-08-31 MED ORDER — SODIUM CHLORIDE 0.9 % IV SOLN: Freq: Once | INTRAVENOUS | Status: AC

## 2022-08-31 MED ORDER — SODIUM CHLORIDE 0.9 % IV SOLN
400.0000 mg/m2 | Freq: Once | INTRAVENOUS | Status: AC
  Administered 2022-08-31: 600 mg via INTRAVENOUS
  Filled 2022-08-31: qty 20

## 2022-08-31 MED ORDER — SODIUM CHLORIDE 0.9% FLUSH
10.0000 mL | Freq: Once | INTRAVENOUS | Status: AC
  Administered 2022-08-31: 10 mL

## 2022-08-31 MED ORDER — PROCHLORPERAZINE MALEATE 10 MG PO TABS
10.0000 mg | ORAL_TABLET | Freq: Four times a day (QID) | ORAL | Status: DC | PRN
  Filled 2022-08-31: qty 1

## 2022-08-31 MED ORDER — SODIUM CHLORIDE 0.9% FLUSH
10.0000 mL | INTRAVENOUS | Status: DC | PRN
  Administered 2022-08-31: 10 mL

## 2022-08-31 MED ORDER — ONDANSETRON HCL 4 MG/2ML IJ SOLN
8.0000 mg | Freq: Once | INTRAMUSCULAR | Status: AC
  Administered 2022-08-31: 8 mg via INTRAVENOUS
  Filled 2022-08-31: qty 4

## 2022-08-31 NOTE — Progress Notes (Signed)
Daily Session Note  Patient Details  Name: Allison [name] MRN: [id number] Date of Birth: 1950-11-02 Referring Provider:   April Manson Pulmonary Rehab Walk Test from 07/12/2022 in [facility] for Heart, Vascular, & Lung Health  Referring Provider Icard       Encounter Date: 08/31/2022  Check In:  Session Check In - 08/31/22 1409       Check-In   Supervising physician immediately available to respond to emergencies [facility] MD immediately available    Physician(s) Dr. Veneda Melter    Location [facility]    Staff Present Janine Ores, RN, Quentin Ore, MS, ACSM-CEP, Exercise Physiologist;Randi Yevonne Pax, ACSM-CEP, Exercise Physiologist;Samantha [place], RD, LDN;Other    Virtual Visit No    Medication changes reported     No    Fall or balance concerns reported    No    Tobacco Cessation No Change    Warm-up and Cool-down Performed as group-led instruction    Resistance Training Performed Yes    VAD Patient? No    PAD/SET Patient? No      Pain Assessment   Currently in Pain? No/denies    Multiple Pain Sites No             Capillary Blood Glucose: Results for orders placed or performed in visit on 08/31/22 (from the past 24 hour(s))  CMP ([facility] only)     Status: Abnormal   Collection Time: 08/31/22  8:41 AM  Result Value Ref Range   Sodium 142 135 - 145 mmol/L   Potassium 3.4 (L) 3.5 - 5.1 mmol/L   Chloride 109 98 - 111 mmol/L   CO2 27 22 - 32 mmol/L   Glucose, Bld 110 (H) 70 - 99 mg/dL   BUN 21 8 - 23 mg/dL   Creatinine 1.19 (H) 0.44 - 1.00 mg/dL   Calcium 9.2 8.9 - 10.3 mg/dL   Total Protein 6.9 6.5 - 8.1 g/dL   Albumin 3.8 3.5 - 5.0 g/dL   AST 32 15 - 41 U/L   ALT 33 0 - 44 U/L   Alkaline Phosphatase 65 38 - 126 U/L   Total Bilirubin 0.2 (L) 0.3 - 1.2 mg/dL   GFR, Estimated 49 (L) >60 mL/min   Anion gap 6 5 - 15  CBC with Differential ([facility] Only)     Status: Abnormal   Collection Time: 08/31/22  8:41  AM  Result Value Ref Range   WBC Count 3.8 (L) 4.0 - 10.5 K/uL   RBC 3.59 (L) 3.87 - 5.11 MIL/uL   Hemoglobin 10.2 (L) 12.0 - 15.0 g/dL   HCT 31.3 (L) 36.0 - 46.0 %   MCV 87.2 80.0 - 100.0 fL   MCH 28.4 26.0 - 34.0 pg   MCHC 32.6 30.0 - 36.0 g/dL   RDW 14.9 11.5 - 15.5 %   Platelet Count 216 150 - 400 K/uL   nRBC 0.0 0.0 - 0.2 %   Neutrophils Relative % 60 %   Neutro Abs 2.3 1.7 - 7.7 K/uL   Lymphocytes Relative 25 %   Lymphs Abs 0.9 0.7 - 4.0 K/uL   Monocytes Relative 13 %   Monocytes Absolute 0.5 0.1 - 1.0 K/uL   Eosinophils Relative 1 %   Eosinophils Absolute 0.0 0.0 - 0.5 K/uL   Basophils Relative 1 %   Basophils Absolute 0.0 0.0 - 0.1 K/uL   Immature Granulocytes 0 %   Abs Immature Granulocytes 0.00 0.00 -  0.07 K/uL   *Note: Due to a large number of results and/or encounters for the requested time period, some results have not been displayed. A complete set of results can be found in Results Review.      Social History   Tobacco Use  Smoking Status Never  Smokeless Tobacco Never    Goals Met:  Proper associated with RPD/PD & O2 Sat Independence with exercise equipment Exercise tolerated well No report of concerns or symptoms today Strength training completed today  Goals Unmet:  Not Applicable  Comments: Service time is from 1323 to 1440.    Dr. Rodman Pickle is Medical Director for Pulmonary Rehab at [facility].

## 2022-08-31 NOTE — Patient Instructions (Signed)
[facility]  Discharge Instructions: Thank you for choosing [facility] to provide your oncology and hematology care.   If you have a lab appointment with the [facility], please go directly to the [facility] and check in at the registration area.   Wear comfortable clothing and clothing appropriate for easy access to any Portacath or PICC line.   We strive to give you quality time with your provider. You may need to reschedule your appointment if you arrive late (15 or more minutes).  Arriving late affects you and other patients whose appointments are after yours.  Also, if you miss three or more appointments without notifying the office, you may be dismissed from the clinic at the provider's discretion.      For prescription refill requests, have your pharmacy contact our office and allow 72 hours for refills to be completed.    Today you received the following chemotherapy and/or immunotherapy agents : Pemetrexed      To help prevent nausea and vomiting after your treatment, we encourage you to take your nausea medication as directed.  BELOW ARE SYMPTOMS THAT SHOULD BE REPORTED IMMEDIATELY: *FEVER GREATER THAN 100.4 F (38 C) OR HIGHER *CHILLS OR SWEATING *NAUSEA AND VOMITING THAT IS NOT CONTROLLED WITH YOUR NAUSEA MEDICATION *UNUSUAL SHORTNESS OF BREATH *UNUSUAL BRUISING OR BLEEDING *URINARY PROBLEMS (pain or burning when urinating, or frequent urination) *BOWEL PROBLEMS (unusual diarrhea, constipation, pain near the anus) TENDERNESS IN MOUTH AND THROAT WITH OR WITHOUT PRESENCE OF ULCERS (sore throat, sores in mouth, or a toothache) UNUSUAL RASH, SWELLING OR PAIN  UNUSUAL VAGINAL DISCHARGE OR ITCHING   Items with * indicate a potential emergency and should be followed up as soon as possible or go to the Emergency Department if any problems should occur.  Please show the CHEMOTHERAPY ALERT CARD or IMMUNOTHERAPY ALERT CARD at  check-in to the Emergency Department and triage nurse.  Should you have questions after your visit or need to cancel or reschedule your appointment, please contact [facility]  Dept: [phone]  and follow the prompts.  Office hours are 8:00 a.m. to 4:30 p.m. Monday - Friday. Please note that voicemails left after 4:00 p.m. may not be returned until the following business day.  We are closed weekends and major holidays. You have access to a nurse at all times for urgent questions. Please call the main number to the clinic Dept: [phone] and follow the prompts.   For any non-urgent questions, you may also contact your provider using MyChart. We now offer e-Visits for anyone 25 and older to request care online for non-urgent symptoms. For details visit [email or web address].   Also download the MyChart app! Go to the app store, search "MyChart", open the app, select [facility], and log in with your MyChart username and password.

## 2022-09-01 DIAGNOSIS — K5901 Slow transit constipation: Secondary | ICD-10-CM

## 2022-09-02 MED ORDER — DOCUSATE SODIUM 100 MG PO CAPS: 100.0000 mg | ORAL_CAPSULE | Freq: Two times a day (BID) | ORAL | 5 refills | Status: DC

## 2022-09-05 ENCOUNTER — Encounter (HOSPITAL_COMMUNITY)
Admission: RE | Admit: 2022-09-05 | Discharge: 2022-09-05 | Disposition: A | Discharge status: Home / Self Care | Payer: Medicare HMO | Source: Ambulatory Visit | Attending: Pulmonary Disease | Admitting: Pulmonary Disease

## 2022-09-05 VITALS — Wt 125.4 lb

## 2022-09-05 DIAGNOSIS — J479 Bronchiectasis, uncomplicated: Secondary | ICD-10-CM

## 2022-09-05 NOTE — Progress Notes (Signed)
Daily Session Note  Patient Details  Name: SUSHILA BARATTA MRN: [id number] Date of Birth: 1951-05-13 Referring Provider:   April Manson Pulmonary Rehab Walk Test from 07/12/2022 in [facility] for Heart, Vascular, & Lung Health  Referring Provider Icard       Encounter Date: 09/05/2022  Check In:  Session Check In - 09/05/22 1520       Check-In   Supervising physician immediately available to respond to emergencies [facility] MD immediately available    Physician(s) Eric Form, NP    [facility]    Staff Present Janine Ores, RN, Quentin Ore, MS, ACSM-CEP, Exercise Physiologist;Other    Virtual Visit No    Medication changes reported     No    Fall or balance concerns reported    No    Tobacco Cessation No Change    Warm-up and Cool-down Performed as group-led instruction    Resistance Training Performed Yes    VAD Patient? No    PAD/SET Patient? No      Pain Assessment   Currently in Pain? No/denies    Pain Score 0-No pain    Multiple Pain Sites No             Capillary Blood Glucose: No results found. However, due to the size of the patient record, not all encounters were searched. Please check Results Review for a complete set of results.   Exercise Prescription Changes - 09/05/22 1500       Response to Exercise   Blood Pressure (Admit) 118/60    Blood Pressure (Exercise) 138/75    Blood Pressure (Exit) 102/60    Heart Rate (Admit) 99 bpm    Heart Rate (Exercise) 106 bpm    Heart Rate (Exit) 88 bpm    Oxygen Saturation (Admit) 99 %    Oxygen Saturation (Exercise) 99 %    Oxygen Saturation (Exit) 96 %    Rating of Perceived Exertion (Exercise) 14    Perceived Dyspnea (Exercise) 1    Duration Continue with 30 min of aerobic exercise without signs/symptoms of physical distress.    Intensity THRR unchanged      Progression   Progression Continue to progress workloads to maintain intensity without  signs/symptoms of physical distress.      Resistance Training   Training Prescription Yes    Weight red bands    Reps 10-15    Time 10 Minutes      NuStep   Level 4    SPM 60    Minutes 15    METs 1.9      Track   Laps 6    Minutes 15    METs 1.92             Social History   Tobacco Use  Smoking Status Never  Smokeless Tobacco Never    Goals Met:  Independence with exercise equipment Exercise tolerated well No report of concerns or symptoms today Strength training completed today  Goals Unmet:  Not Applicable  Comments: Service time is from 1319 to 1445    Dr. Rodman Pickle is Medical Director for Pulmonary Rehab at [facility].

## 2022-09-07 ENCOUNTER — Encounter (HOSPITAL_COMMUNITY)
Admission: RE | Admit: 2022-09-07 | Discharge: 2022-09-07 | Disposition: A | Discharge status: Home / Self Care | Payer: Medicare HMO | Source: Ambulatory Visit | Attending: Pulmonary Disease | Admitting: Pulmonary Disease

## 2022-09-07 DIAGNOSIS — J479 Bronchiectasis, uncomplicated: Secondary | ICD-10-CM

## 2022-09-07 NOTE — Progress Notes (Signed)
Daily Session Note  Patient Details  Name: Allison [name] MRN: [id number] Date of Birth: 08/11/1950 Referring Provider:   April Manson Pulmonary Rehab Walk Test from 07/12/2022 in [facility] for Heart, Vascular, & Lung Health  Referring Provider Icard       Encounter Date: 09/07/2022  Check In:  Session Check In - 09/07/22 1535       Check-In   Supervising physician immediately available to respond to emergencies [facility] immediately available    Physician(s) Dr Marlyce Huge    [facility]    Staff Present Janine Ores, RN, Quentin Ore, MS, ACSM-CEP, Exercise Physiologist;Other;Samantha [place], RD, LDN    Virtual Visit No    Medication changes reported     No    Fall or balance concerns reported    No    Tobacco Cessation No Change    Warm-up and Cool-down Performed as group-led instruction    Resistance Training Performed Yes    VAD Patient? No    PAD/SET Patient? No      Pain Assessment   Currently in Pain? No/denies    Multiple Pain Sites No             Capillary Blood Glucose: No results found. However, due to the size of the patient record, not all encounters were searched. Please check Results Review for a complete set of results.    Social History   Tobacco Use  Smoking Status Never  Smokeless Tobacco Never    Goals Met:  Proper associated with RPD/PD & O2 Sat Independence with exercise equipment Exercise tolerated well No report of concerns or symptoms today Strength training completed today  Goals Unmet:  Not Applicable  Comments: Service time is from 1330 to 1449.    Dr. Rodman Pickle is Medical Director for Pulmonary Rehab at [facility].

## 2022-09-12 ENCOUNTER — Encounter (HOSPITAL_COMMUNITY)
Admission: RE | Admit: 2022-09-12 | Discharge: 2022-09-12 | Disposition: A | Discharge status: Home / Self Care | Payer: Medicare HMO | Source: Ambulatory Visit | Attending: Pulmonary Disease | Admitting: Pulmonary Disease

## 2022-09-12 DIAGNOSIS — J479 Bronchiectasis, uncomplicated: Secondary | ICD-10-CM | POA: Diagnosis not present

## 2022-09-12 NOTE — Progress Notes (Signed)
Daily Session Note  Patient Details  Name: ODEAL LESNIK MRN: [id number] Date of Birth: [birth year] Referring Provider:   April Manson Pulmonary Rehab Walk Test from 07/12/2022 in [facility] for Heart, Vascular, & Lung Health  Referring Provider Icard       Encounter Date: 09/12/2022  Check In:  Session Check In - 09/12/22 1448       Check-In   Supervising physician immediately available to respond to emergencies [facility] immediately available    Physician(s) [place] Crane, PA    [facility]    Staff Present Janine Ores, RN, Quentin Ore, MS, ACSM-CEP, Exercise Physiologist;Randi Yevonne Pax, ACSM-CEP, Exercise Physiologist;Casey Tamala Julian, RT    Virtual Visit No    Medication changes reported     No    Fall or balance concerns reported    No    Tobacco Cessation No Change    Warm-up and Cool-down Performed as group-led instruction    Resistance Training Performed Yes    VAD Patient? No    PAD/SET Patient? No      Pain Assessment   Currently in Pain? No/denies    Multiple Pain Sites No             Capillary Blood Glucose: No results found. However, due to the size of the patient record, not all encounters were searched. Please check Results Review for a complete set of results.    Social History   Tobacco Use  Smoking Status Never  Smokeless Tobacco Never    Goals Met:  Independence with exercise equipment Exercise tolerated well No report of concerns or symptoms today Strength training completed today  Goals Unmet:  Not Applicable  Comments: Service time is from 1332 to 1509    Dr. Rodman Pickle is Medical Director for Pulmonary Rehab at [facility].

## 2022-09-14 ENCOUNTER — Encounter (HOSPITAL_COMMUNITY)
Admission: RE | Admit: 2022-09-14 | Discharge: 2022-09-14 | Disposition: A | Discharge status: Home / Self Care | Payer: Medicare HMO | Source: Ambulatory Visit | Attending: Pulmonary Disease | Admitting: Pulmonary Disease

## 2022-09-14 DIAGNOSIS — J479 Bronchiectasis, uncomplicated: Secondary | ICD-10-CM | POA: Diagnosis not present

## 2022-09-14 NOTE — Progress Notes (Signed)
Daily Session Note  Patient Details  Name: LEOKADIA DORRIES MRN: [id number] Date of Birth: [birth year] Referring Provider:   April Manson Pulmonary Rehab Walk Test from 07/12/2022 in [facility] for Heart, Vascular, & Lung Health  Referring Provider Icard       Encounter Date: 09/14/2022  Check In:  Session Check In - 09/14/22 [place]       Check-In   Supervising physician immediately available to respond to emergencies [facility] immediately available    Physician(s) Dr. Vaughan Browner    [facility]    Staff Present Janine Ores, RN, Quentin Ore, MS, ACSM-CEP, Exercise Physiologist;Randi Yevonne Pax, ACSM-CEP, Exercise Physiologist;Casey Saunders Revel, MS, ACSM-CEP, CCRP, Exercise Physiologist;Jetta Walker BS, ACSM-CEP, Exercise Physiologist    Virtual Visit No    Medication changes reported     No    Fall or balance concerns reported    No    Tobacco Cessation No Change    Warm-up and Cool-down Performed as group-led instruction    Resistance Training Performed Yes    VAD Patient? No    PAD/SET Patient? No      Pain Assessment   Currently in Pain? No/denies    Multiple Pain Sites No             Capillary Blood Glucose: No results found. However, due to the size of the patient record, not all encounters were searched. Please check Results Review for a complete set of results.    Social History   Tobacco Use  Smoking Status Never  Smokeless Tobacco Never    Goals Met:  Independence with exercise equipment Exercise tolerated well No report of concerns or symptoms today Strength training completed today  Goals Unmet:  Not Applicable  Comments: Service time is from 1320 to [place]    Dr. Rodman Pickle is Medical Director for Pulmonary Rehab at [facility].

## 2022-09-15 NOTE — Progress Notes (Signed)
[facility] Note  09/20/2022     CHIEF COMPLAINT Patient presents for Retina Follow Up   HISTORY OF PRESENT ILLNESS: Allison [name] is a 72 y.o. female who presents to the clinic today for:   HPI     Retina Follow Up   In right eye.  This started 4 months ago.  Duration of 4 months.  Since onset it is stable.  I, the attending physician,  performed the HPI with the patient and updated documentation appropriately.        Comments   4 month retina follow up ERM OD pt is reporting vision maybe little worse she is having some irritation with itching she not using any gtts at this time       Last edited by Bernarda Caffey, MD on 09/20/2022 12:15 PM.    Pt feels like vision may have gotten worse, she states her eyes are itching, pt is using latanoprost and Cosopt for glaucoma, pt states she is hesitant to have sx on her ERM bc she is on a blood thinner, she states when she had right eye cataract sx, her pressure spiked, she has stage 4 lung cancer  Referring physician: Warden Fillers, MD [address] [place],  [place] [address]  HISTORICAL INFORMATION:   Selected notes from the MEDICAL RECORD NUMBER Referred by Dr. Katy Fitch for second opinion ERM OD LEE:  Ocular Hx- saw Dr. Zadie Rhine 2x PMH-    CURRENT MEDICATIONS: Current Outpatient Medications (Ophthalmic Drugs)  Medication Sig   dorzolamide-timolol (COSOPT) 22.3-6.8 MG/ML ophthalmic solution Place 1 drop into both eyes 2 (two) times daily.   latanoprost (XALATAN) 0.005 % ophthalmic solution Place 1 drop into both eyes at bedtime.    No current facility-administered medications for this visit. (Ophthalmic Drugs)   Current Outpatient Medications (Other)  Medication Sig   acetaminophen (TYLENOL) 500 MG tablet Take 1,000 mg by mouth every 6 (six) hours as needed (pain).   ALPRAZolam (XANAX) 0.25 MG tablet Take 1 tablet (0.25 mg total) by mouth 2 (two) times daily as needed for anxiety. May  take 2 tablets (0.5 mg total) at bedtime.   amLODipine (NORVASC) 5 MG tablet Take 1.5 tablets (7.5 mg total) by mouth daily.   carvedilol (COREG) 25 MG tablet Take 0.5 tablets (12.5 mg total) by mouth 2 (two) times daily.   dexamethasone (DECADRON) 4 MG tablet TAKE 1 TABLET TWICE A DAY THE DAY BEFORE, THE DAY OF, AND THE DAY AFTER CHEMOTHERAPY   docusate sodium (COLACE) 100 MG capsule Take 1 capsule (100 mg total) by mouth 2 (two) times daily.   enoxaparin (LOVENOX) 80 MG/0.8ML injection Inject 80 mg into the skin daily.   ezetimibe (ZETIA) 10 MG tablet Take 10 mg by mouth daily.   FeFum-FePoly-FA-B Cmp-C-Biot (FOLIVANE-PLUS) CAPS TAKE 1 CAPSULE BY MOUTH EVERY DAY IN THE MORNING   FLUoxetine (PROZAC) 20 MG capsule Take 1 capsule (20 mg total) by mouth daily.   folic acid (FOLVITE) 1 MG tablet Take 1 tablet (1 mg total) by mouth daily.   hyoscyamine (LEVSIN SL) 0.125 MG SL tablet PLACE 1-2 TABLETS UNDER THE TONGUE EVERY 6 HOURS AS NEEDED FOR CRAMPING   ipratropium-albuterol (DUONEB) 0.5-2.5 (3) MG/3ML SOLN Take 3 mLs by nebulization every 6 (six) hours as needed (Asthma).   isosorbide mononitrate (IMDUR) 30 MG 24 hr tablet TAKE 1 TABLET BY MOUTH EVERY DAY   ondansetron (ZOFRAN-ODT) 4 MG disintegrating tablet Take 1 tablet (4  mg total) by mouth every 8 (eight) hours as needed for nausea or vomiting.   osimertinib mesylate (TAGRISSO) 80 MG tablet Take 1 tablet (80 mg total) by mouth daily.   prochlorperazine (COMPAZINE) 10 MG tablet Take 1 tablet (10 mg total) by mouth every 6 (six) hours as needed.   promethazine (PHENERGAN) 12.5 MG tablet Take 1 tablet (12.5 mg total) by mouth every 6 (six) hours as needed for nausea or vomiting.   REPATHA SURECLICK [id number] MG/ML SOAJ INJECT 140 MG INTO THE SKIN EVERY 14 (FOURTEEN) DAYS.   valACYclovir (VALTREX) 500 MG tablet    No current facility-administered medications for this visit. (Other)   REVIEW OF SYSTEMS: ROS   Positive for: Constitutional,  Neurological, Eyes, Respiratory, Heme/Lymph Last edited by Parthenia Ames, COT on 09/20/2022  9:54 AM.      ALLERGIES Allergies  Allergen Reactions   Hydrocodone-Acetaminophen Other (See Comments)   Penicillins Other (See Comments)    SYNCOPE PATIENT HAS HAD A PCN REACTION WITH IMMEDIATE RASH, FACIAL/TONGUE/THROAT SWELLING, SOB, OR LIGHTHEADEDNESS WITH HYPOTENSION:  #  #  YES  #  # Has patient had a PCN reaction causing severe rash involving mucus membranes or skin necrosis: No Has patient had a PCN reaction that required hospitalization: No Has patient had a PCN reaction occurring within the last 10 years: No  Other reaction(s): other   Lactose Other (See Comments)    Allergy to all dairy products Abdominal cramping   Vicodin [Hydrocodone-Acetaminophen] Other (See Comments)    Sped up heart and breathing   Lactase-Lactobacillus Nausea And Vomiting   Other Other (See Comments)    Glaucoma eye drop - turned eyes dark   PAST MEDICAL HISTORY Past Medical History:  Diagnosis Date   Anemia    Anxiety    Arthritis    Asthma    exercise induced   Coronary artery disease    Depression    PMH   Dyspnea    GERD (gastroesophageal reflux disease)    Glaucoma    History of radiation therapy 01/05/2021   IMRT right lung  11/24/2020-01/05/2021  Dr [name]   Hypertension    lung ca dx'd 11/2017   right   Malignant pleural effusion    right   Nuclear sclerotic cataract of right eye 02/28/2021   Dr. Warden Fillers, cataract surgery February 2023   PONV (postoperative nausea and vomiting)    Pre-diabetes    Raynaud's disease    Raynaud's disease    Stroke 01/2021   balance off, some express aphasia, weakness   Past Surgical History:  Procedure Laterality Date   ABDOMINAL HYSTERECTOMY     partial   BRONCHIAL BIOPSY  04/21/2021   Procedure: BRONCHIAL BIOPSIES;  Surgeon: [name], DO;  Location: [facility];  Service: Pulmonary;;   BRONCHIAL BRUSHINGS   04/21/2021   Procedure: BRONCHIAL BRUSHINGS;  Surgeon: [name], DO;  Location: [facility];  Service: Pulmonary;;   BRONCHIAL NEEDLE ASPIRATION BIOPSY  04/21/2021   Procedure: BRONCHIAL NEEDLE ASPIRATION BIOPSIES;  Surgeon: [name], DO;  Location: [facility];  Service: Pulmonary;;   CHEST TUBE INSERTION Right 01/01/2018   Procedure: INSERTION PLEURAL DRAINAGE CATHETER;  Surgeon: Ivin Poot, MD;  Location: [facility];  Service: Thoracic;  Laterality: Right;   CHEST TUBE INSERTION  04/21/2021   Procedure: CHEST TUBE INSERTION;  Surgeon: [name], DO;  Location: [facility];  Service: Pulmonary;;   COLONOSCOPY     CORONARY STENT INTERVENTION  N/A 06/16/2019   Procedure: CORONARY STENT INTERVENTION;  Surgeon: Jettie Booze, MD;  Location: [facility];  Service: Cardiovascular;  Laterality: N/A;   DILATION AND CURETTAGE OF UTERUS     EYE SURGERY     due to Glaucoma   IR IMAGING GUIDED PORT INSERTION  03/22/2021   IR PORT REPAIR CENTRAL VENOUS ACCESS DEVICE  04/15/2021   LEFT HEART CATH AND CORONARY ANGIOGRAPHY N/A 06/16/2019   Procedure: LEFT HEART CATH AND CORONARY ANGIOGRAPHY;  Surgeon: Jettie Booze, MD;  Location: [facility];  Service: Cardiovascular;  Laterality: N/A;   REMOVAL OF PLEURAL DRAINAGE CATHETER Right 11/07/2018   Procedure: REMOVAL OF PLEURAL DRAINAGE CATHETER;  Surgeon: Ivin Poot, MD;  Location: [facility];  Service: Thoracic;  Laterality: Right;   ROTATOR CUFF REPAIR     TUBAL LIGATION     VIDEO BRONCHOSCOPY WITH ENDOBRONCHIAL NAVIGATION Bilateral 04/21/2021   Procedure: VIDEO BRONCHOSCOPY WITH ENDOBRONCHIAL NAVIGATION;  Surgeon: [name], DO;  Location: [facility];  Service: Pulmonary;  Laterality: Bilateral;  ION   WISDOM TOOTH EXTRACTION     FAMILY HISTORY Family History  Problem Relation Age of Onset   Heart disease Sister    Heart disease Brother    Lung cancer Other    SOCIAL HISTORY Social History    Tobacco Use   Smoking status: Never   Smokeless tobacco: Never  Vaping Use   Vaping Use: Never used  Substance Use Topics   Alcohol use: Not Currently    Comment: up to 3 drinks per week   Drug use: No    Comment: CBD oil        OPHTHALMIC EXAM:  Base Eye Exam     Visual Acuity (Snellen - Linear)       Right Left   Dist cc 20/70 20/30 -2   Dist ph cc NI NI    Correction: Glasses         Tonometry (Tonopen, 9:59 AM)       Right Left   Pressure 13 18         Pupils       Pupils Dark Light Shape React APD   Right PERRL 4 3 Round Brisk None   Left PERRL 4 3 Round Brisk None         Visual Fields       Left Right    Full Full         Extraocular Movement       Right Left    Full, Ortho Full, Ortho         Neuro/Psych     Oriented x3: Yes   Mood/Affect: Normal         Dilation     Both eyes: 2.5% Phenylephrine @ 9:59 AM           Slit Lamp and Fundus Exam     Slit Lamp Exam       Right Left   Lids/Lashes Dermatochalasis - upper lid Dermatochalasis - upper lid   Conjunctiva/Sclera mild melanosis mild melanosis   Cornea well healed cataract wound, trace PEE trace PEE   Anterior Chamber deep and clear deep and clear   Iris Round and dilated Round and dilated   Lens PC IOL in good position, trace Posterior capsular opacification 2-3+ Nuclear sclerosis with brunescence, 2-3+ Cortical cataract, 2+ Posterior subcapsular cataract   Anterior Vitreous Vitreous syneresis, Posterior vitreous detachment Vitreous syneresis  Fundus Exam       Right Left   Disc mild Pallor, Sharp rim, +cupping, this superior rim, mild PPA Pink and Sharp, +cupping, +PPP   C/D Ratio 0.85 0.7   Macula Flat, Blunted foveal reflex, ERM with striae and early fibrosis, no heme Flat, Good foveal reflex, RPE mottling and clumping, No heme or edema   Vessels attenuated, mild copper wiring, mild AV crossing changes mild attenuation, mild tortuosity, mild  copper wiring   Periphery Attached, No heme Attached, No heme            IMAGING AND PROCEDURES  Imaging and Procedures for 09/20/2022  OCT, Retina - OU - Both Eyes       Right Eye Quality was good. Scan locations included subfoveal. Central Foveal Thickness: 574. Progression has worsened. Findings include no SRF, abnormal foveal contour, epiretinal membrane, intraretinal fluid, macular pucker, preretinal fibrosis (ERM with with pucker and mild interval increase in cystic changes).   Left Eye Quality was good. Scan locations included subfoveal. Central Foveal Thickness: 227. Progression has been stable. Findings include normal foveal contour, no IRF, no SRF (Trace ERM).   Notes *Images captured and stored on drive  Diagnosis / Impression:  OD: ERM with with pucker and mild interval increase in cystic changes OS: Trace ERM  Clinical management:  See below  Abbreviations: NFP - Normal foveal profile. CME - cystoid macular edema. PED - pigment epithelial detachment. IRF - intraretinal fluid. SRF - subretinal fluid. EZ - ellipsoid zone. ERM - epiretinal membrane. ORA - outer retinal atrophy. ORT - outer retinal tubulation. SRHM - subretinal hyper-reflective material. IRHM - intraretinal hyper-reflective material            ASSESSMENT/PLAN:    ICD-10-CM   1. Epiretinal membrane, right eye  H35.371 OCT, Retina - OU - Both Eyes    2. Essential hypertension  I10     3. Hypertensive retinopathy of both eyes  H35.033     4. Combined forms of age-related cataract of left eye  H25.812     5. Pseudophakia  Z96.1     6. Bilateral ocular hypertension  H40.053      Epiretinal membrane, right eye  - mild progression from Nov 2023 (4 mos) - OCT shows ERM with pucker and mild interval increase in cystic changes  - BCVA OD 20/70 from 20/30 - +metamorphopsia - discussed treatment options limited to surgery or no surgery -- no medications, drops, diet, or lifestyle changes to  treat ERM - RBA of ERM surgery discussed, questions answered - pt wishes to continue to monitor due to other health issues -- reasonable - f/u 6 mos, sooner prn -- DFE/OCT  2,3. Hypertensive retinopathy OU - discussed importance of tight BP control - monitor  4. Mixed Cataract OS - The symptoms of cataract, surgical options, and treatments and risks were discussed with patient. - discussed diagnosis and progression - BCVA 20/30 from 20/20 -- suspect cat progression - under the expert management of Dr. Shirleen Schirmer  5. Pseudophakia OD  - s/p CE/IOL OD (Dr. Shirleen Schirmer)  - IOL in good position, doing well  - monitor  6. Ocular Hypertension  - IOP today: 13,18  - on latanoprost QHS OU and Cosopt BID OU  - managed by Dr. Shirleen Schirmer  Ophthalmic Meds Ordered this visit:  No orders of the defined types were placed in this encounter.    Return in about 6 months (around 03/22/2023) for ERM OD, DFE, OCT.  There are no Patient Instructions on file for this visit.   Explained the diagnoses, plan, and follow up with the patient and they expressed understanding.  Patient expressed understanding of the importance of proper follow up care.   This document serves as a record of services personally performed by Gardiner Sleeper, MD, PhD. It was created on their behalf by Orvan Falconer, an ophthalmic technician. The creation of this record is the provider's dictation and/or activities during the visit.    Electronically signed by: Orvan Falconer, OA, 09/20/22  12:16 PM  This document serves as a record of services personally performed by Gardiner Sleeper, MD, PhD. It was created on their behalf by San Jetty. Owens Shark, OA an ophthalmic technician. The creation of this record is the provider's dictation and/or activities during the visit.    Electronically signed by: San Jetty. Owens Shark, OA  12:16 PM  Gardiner Sleeper, M.D., Ph.D. Diseases & Surgery of the Retina and Vitreous [facility]  I have reviewed the above documentation for accuracy and completeness, and I agree with the above. Gardiner Sleeper, M.D., Ph.D. 09/20/22 12:25 PM   Abbreviations: M myopia (nearsighted); A astigmatism; H hyperopia (farsighted); P presbyopia; Mrx spectacle prescription;  CTL contact lenses; OD right eye; OS left eye; OU both eyes  XT exotropia; ET esotropia; PEK punctate epithelial keratitis; PEE punctate epithelial erosions; DES dry eye syndrome; MGD meibomian gland dysfunction; ATs artificial tears; PFAT's preservative free artificial tears; [place] nuclear sclerotic cataract; PSC posterior subcapsular cataract; ERM epi-retinal membrane; PVD posterior vitreous detachment; RD retinal detachment; DM diabetes mellitus; DR diabetic retinopathy; NPDR non-proliferative diabetic retinopathy; PDR proliferative diabetic retinopathy; CSME clinically significant macular edema; DME diabetic macular edema; dbh dot blot hemorrhages; CWS cotton wool spot; POAG primary open angle glaucoma; C/D cup-to-disc ratio; HVF humphrey visual field; GVF goldmann visual field; OCT optical coherence tomography; IOP intraocular pressure; BRVO Branch retinal vein occlusion; CRVO central retinal vein occlusion; CRAO central retinal artery occlusion; BRAO branch retinal artery occlusion; RT retinal tear; SB scleral buckle; PPV pars plana vitrectomy; VH Vitreous hemorrhage; PRP panretinal laser photocoagulation; IVK intravitreal kenalog; VMT vitreomacular traction; MH Macular hole;  NVD neovascularization of the disc; NVE neovascularization elsewhere; AREDS age related eye disease study; ARMD age related macular degeneration; POAG primary open angle glaucoma; EBMD epithelial/anterior basement membrane dystrophy; ACIOL anterior chamber intraocular lens; IOL intraocular lens; PCIOL posterior chamber intraocular lens; Phaco/IOL phacoemulsification with intraocular lens placement; [place] photorefractive keratectomy; LASIK laser assisted in situ  keratomileusis; HTN hypertension; DM diabetes mellitus; COPD chronic obstructive pulmonary disease

## 2022-09-19 ENCOUNTER — Encounter (HOSPITAL_COMMUNITY)
Admission: RE | Admit: 2022-09-19 | Discharge: 2022-09-19 | Disposition: A | Discharge status: Home / Self Care | Payer: Medicare HMO | Source: Ambulatory Visit | Attending: Pulmonary Disease | Admitting: Pulmonary Disease

## 2022-09-19 VITALS — Wt 125.9 lb

## 2022-09-19 DIAGNOSIS — J479 Bronchiectasis, uncomplicated: Secondary | ICD-10-CM | POA: Insufficient documentation

## 2022-09-19 NOTE — Progress Notes (Signed)
Daily Session Note  Patient Details  Name: Allison [name] MRN: [id number] Date of Birth: [birth year] Referring Provider:   April Manson Pulmonary Rehab Walk Test from 07/12/2022 in [facility]  Referring Provider Icard       Encounter Date: 09/19/2022  Check In:  Session Check In - 09/19/22 1613       Check-In   Supervising physician immediately available to respond to emergencies [facility] MD immediately available    Physician(s) Dr. Vaughan Browner    Location [facility]    Staff Present Elmon Else, MS, ACSM-CEP, Exercise Physiologist;Randi Yevonne Pax, ACSM-CEP, Exercise Physiologist;Casey Lazarus Salines, RN, Marga Melnick, RN, BSN    Virtual Visit No    Medication changes reported     No    Fall or balance concerns reported    No    Tobacco Cessation No Change    Warm-up and Cool-down Performed as group-led Higher education careers adviser Performed Yes    VAD Patient? No    PAD/SET Patient? No      Pain Assessment   Currently in Pain? No/denies    Pain Score 0-No pain    Multiple Pain Sites No             Capillary Blood Glucose: No results found. However, due to the size of the patient record, not all encounters were searched. Please check Results Review for a complete set of results.   Exercise Prescription Changes - 09/19/22 1600       Response to Exercise   Blood Pressure (Admit) 118/58    Blood Pressure (Exercise) 120/60    Blood Pressure (Exit) 124/60    Heart Rate (Admit) 81 bpm    Heart Rate (Exercise) 95 bpm    Heart Rate (Exit) 81 bpm    Oxygen Saturation (Admit) 100 %    Oxygen Saturation (Exercise) 100 %    Oxygen Saturation (Exit) 100 %    Rating of Perceived Exertion (Exercise) 10    Perceived Dyspnea (Exercise) 1    Duration Continue with 30 min of aerobic exercise without signs/symptoms of physical distress.    Intensity THRR unchanged      Progression    Progression Continue to progress workloads to maintain intensity without signs/symptoms of physical distress.      Resistance Training   Training Prescription Yes    Weight blue bands    Reps 10-15    Time 10 Minutes      NuStep   Level 5    SPM 60    Minutes 15    METs 2.4      Track   Laps 8    Minutes 15    METs 2.23             Social History   Tobacco Use  Smoking Status Never  Smokeless Tobacco Never    Goals Met:  Proper associated with RPD/PD & O2 Sat Independence with exercise equipment Exercise tolerated well No report of concerns or symptoms today Strength training completed today  Goals Unmet:  Not Applicable  Comments: Service time is from 1323 to 1445.    Dr. Rodman Pickle is Medical Director for Pulmonary Rehab at [facility].

## 2022-09-20 ENCOUNTER — Other Ambulatory Visit: Payer: Self-pay | Admitting: Medical Oncology

## 2022-09-20 ENCOUNTER — Ambulatory Visit (INDEPENDENT_AMBULATORY_CARE_PROVIDER_SITE_OTHER): Payer: Medicare HMO | Admitting: Ophthalmology

## 2022-09-20 ENCOUNTER — Encounter (INDEPENDENT_AMBULATORY_CARE_PROVIDER_SITE_OTHER): Payer: Self-pay | Admitting: Ophthalmology

## 2022-09-20 DIAGNOSIS — C3491 Malignant neoplasm of unspecified part of right bronchus or lung: Secondary | ICD-10-CM

## 2022-09-20 DIAGNOSIS — H25812 Combined forms of age-related cataract, left eye: Secondary | ICD-10-CM | POA: Diagnosis not present

## 2022-09-20 DIAGNOSIS — H35033 Hypertensive retinopathy, bilateral: Secondary | ICD-10-CM | POA: Diagnosis not present

## 2022-09-20 DIAGNOSIS — H35371 Puckering of macula, right eye: Secondary | ICD-10-CM | POA: Diagnosis not present

## 2022-09-20 DIAGNOSIS — I10 Essential (primary) hypertension: Secondary | ICD-10-CM | POA: Diagnosis not present

## 2022-09-20 DIAGNOSIS — Z961 Presence of intraocular lens: Secondary | ICD-10-CM

## 2022-09-20 DIAGNOSIS — H40053 Ocular hypertension, bilateral: Secondary | ICD-10-CM

## 2022-09-20 DIAGNOSIS — H2511 Age-related nuclear cataract, right eye: Secondary | ICD-10-CM

## 2022-09-20 MED ORDER — OSIMERTINIB MESYLATE 80 MG PO TABS: 80.0000 mg | ORAL_TABLET | Freq: Every day | ORAL | 4 refills | Status: DC

## 2022-09-21 ENCOUNTER — Ambulatory Visit: Payer: Medicare HMO | Admitting: Physician Assistant

## 2022-09-21 ENCOUNTER — Ambulatory Visit: Payer: Medicare HMO

## 2022-09-21 ENCOUNTER — Other Ambulatory Visit: Payer: Medicare HMO

## 2022-09-21 ENCOUNTER — Encounter (HOSPITAL_COMMUNITY)
Admission: RE | Admit: 2022-09-21 | Discharge: 2022-09-21 | Disposition: A | Discharge status: Home / Self Care | Payer: Medicare HMO | Source: Ambulatory Visit | Attending: Pulmonary Disease | Admitting: Pulmonary Disease

## 2022-09-21 DIAGNOSIS — J479 Bronchiectasis, uncomplicated: Secondary | ICD-10-CM | POA: Diagnosis not present

## 2022-09-21 NOTE — Progress Notes (Signed)
Daily Session Note  Patient Details  Name: Allison [name] MRN: [id number] Date of Birth: May 21, 1951 Referring Provider:   April Manson Pulmonary Rehab Walk Test from 07/12/2022 in [facility] [place]  Referring Provider Icard       Encounter Date: 09/21/2022  Check In:  Session Check In - 09/21/22 1509       Check-In   Supervising physician immediately available to respond to emergencies [facility] MD immediately available    Physician(s) Sheela Stack, NP    Location [facility]    Staff Present Elmon Else, MS, ACSM-CEP, Exercise Physiologist;Lizandra Zakrzewski Yevonne Pax, ACSM-CEP, Exercise Physiologist;Casey Olen Pel, RN, BSN    Virtual Visit No    Medication changes reported     No    Fall or balance concerns reported    No    Tobacco Cessation No Change    Warm-up and Cool-down Not performed (comment)   pt here for 6MWT only   Resistance Training Performed Yes    VAD Patient? No    PAD/SET Patient? No      Pain Assessment   Currently in Pain? No/denies    Pain Score 0-No pain    Multiple Pain Sites No             Capillary Blood Glucose: No results found. However, due to the size of the patient record, not all encounters were searched. Please check Results Review for a complete set of results.    Social History   Tobacco Use  Smoking Status Never  Smokeless Tobacco Never    Goals Met:  Independence with exercise equipment Exercise tolerated well No report of concerns or symptoms today Strength training completed today  Goals Unmet:  Not Applicable  Comments: Pt completed 6 min walk test and graduated. Service time is from 1450 to 1520.    Dr. Rodman Pickle is Medical Director for Pulmonary Rehab at [facility].

## 2022-09-22 NOTE — Progress Notes (Signed)
Discharge Progress Report  Patient Details  Name: Allison [name] MRN: [id number] Date of Birth: [birth year] Referring Provider:   Doristine Devoid Pulmonary Rehab Walk Test from 07/12/2022 in [facility]  Referring Provider Icard        Number of Visits: 60  Reason for Discharge:  Patient has met program and personal goals.  Smoking History:  Social History   Tobacco Use  Smoking Status Never  Smokeless Tobacco Never    Diagnosis:  Bronchiectasis without complication  ADL UCSD:  Pulmonary Assessment Scores     Row Name 07/12/22 1055 09/14/22 0957 09/21/22 1559     ADL UCSD   ADL Phase -- Exit Exit   SOB Score total 45 43 --     CAT Score   CAT Score 33 29 --     mMRC Score   mMRC Score 3 -- 2            Initial Exercise Prescription:  Initial Exercise Prescription - 07/12/22 1100       Date of Initial Exercise RX and Referring Provider   Date 07/12/22    Referring Provider Icard    Expected Discharge Date 09/14/22      NuStep   Level 1    SPM 60    Minutes 15      Track   Minutes 15    METs 2.3      Prescription Details   Frequency (times per week) 2    Duration Progress to 30 minutes of continuous aerobic without signs/symptoms of physical distress      Intensity   THRR 40-80% of Max Heartrate 60-119    Ratings of Perceived Exertion 11-13    Perceived Dyspnea 0-4      Progression   Progression Continue to progress workloads to maintain intensity without signs/symptoms of physical distress.      Resistance Training   Training Prescription Yes    Weight red bands    Reps 10-15             Discharge Exercise Prescription (Final Exercise Prescription Changes):  Exercise Prescription Changes - 09/19/22 1600       Response to Exercise   Blood Pressure (Admit) 118/58    Blood Pressure (Exercise) 120/60    Blood Pressure (Exit) 124/60    Heart Rate (Admit) 81 bpm    Heart  Rate (Exercise) 95 bpm    Heart Rate (Exit) 81 bpm    Oxygen Saturation (Admit) 100 %    Oxygen Saturation (Exercise) 100 %    Oxygen Saturation (Exit) 100 %    Rating of Perceived Exertion (Exercise) 10    Perceived Dyspnea (Exercise) 1    Duration Continue with 30 min of aerobic exercise without signs/symptoms of physical distress.    Intensity THRR unchanged      Progression   Progression Continue to progress workloads to maintain intensity without signs/symptoms of physical distress.      Resistance Training   Training Prescription Yes    Weight blue bands    Reps 10-15    Time 10 Minutes      NuStep   Level 5    SPM 60    Minutes 15    METs 2.4      Track   Laps 8    Minutes 15    METs 2.23             Functional Capacity:  6 Minute Walk     Row Name 07/12/22 1154 09/21/22 1551       6 Minute Walk   Phase Initial Discharge    Distance 920 feet 1464 feet    Distance % Change -- 59.1 %    Distance Feet Change -- 544 ft    Walk Time 6 minutes 6 minutes    # of Rest Breaks 0 0    MPH 1.74 2.77    METS 2.3 3.5    RPE 12 9    Perceived Dyspnea  0.5 1    VO2 Peak 8.05 12.23    Symptoms No No    Resting HR 82 bpm 75 bpm    Resting BP 130/60 122/60    Resting Oxygen Saturation  100 % 100 %    Exercise Oxygen Saturation  during 6 min walk 95 % 99 %    Max Ex. HR 94 bpm 104 bpm    Max Ex. BP 120/58 140/56    2 Minute Post BP 112/60 132/60      Interval HR   1 Minute HR 94 92    2 Minute HR 92 96    3 Minute HR 93 98    4 Minute HR 93 100    5 Minute HR 91 104    6 Minute HR 91 104    2 Minute Post HR 72 77    Interval Heart Rate? Yes Yes      Interval Oxygen   Interval Oxygen? Yes Yes    Baseline Oxygen Saturation % 100 % 100 %    1 Minute Oxygen Saturation % 95 % 99 %    1 Minute Liters of Oxygen 0 L 0 L    2 Minute Oxygen Saturation % 95 % 96 %    2 Minute Liters of Oxygen 0 L 0 L    3 Minute Oxygen Saturation % 97 % 99 %    3 Minute Liters  of Oxygen 0 L 0 L    4 Minute Oxygen Saturation % 97 % 99 %    4 Minute Liters of Oxygen 0 L 0 L    5 Minute Oxygen Saturation % 96 % 100 %    5 Minute Liters of Oxygen 0 L 0 L    6 Minute Oxygen Saturation % 98 % 100 %    6 Minute Liters of Oxygen 0 L 0 L    2 Minute Post Oxygen Saturation % 97 % 99 %    2 Minute Post Liters of Oxygen 0 L 0 L             Psychological, QOL, Others - Outcomes: PHQ 2/9:    09/14/2022    9:59 AM 07/12/2022   10:46 AM 02/02/2021    1:58 PM 12/29/2019    8:43 AM 10/21/2019    7:49 AM  Depression screen PHQ 2/9  Decreased Interest 1 0 1 1 0  Down, Depressed, Hopeless 0 0 2 1 0  PHQ - 2 Score 1 0 3 2 0  Altered sleeping 2 1 3 3    Tired, decreased energy 2 0 3 2   Change in appetite 1 0 2 0   Feeling bad or failure about yourself  0 1 0 0   Trouble concentrating 2 0 1 1   Moving slowly or fidgety/restless 2 0 3 0   Suicidal thoughts 0 0 0 0   PHQ-9 Score 10 2 15  8  Difficult doing work/chores Somewhat difficult Not difficult at all       Quality of Life:   Personal Goals: Goals established at orientation with interventions provided to work toward goal.  Personal Goals and Risk Factors at Admission - 08/23/22 1339       Core Components/Risk Factors/Patient Goals on Admission    Weight Management Yes;Weight Gain    Intervention Weight Management: Develop a combined nutrition and exercise program designed to reach desired caloric intake, while maintaining appropriate intake of nutrient and fiber, sodium and fats, and appropriate energy expenditure required for the weight goal.;Weight Management: Provide education and appropriate resources to help participant work on and attain dietary goals.    Expected Outcomes Short Term: Continue to assess and modify interventions until short term weight is achieved;Long Term: Adherence to nutrition and physical activity/exercise program aimed toward attainment of established weight goal;Weight Maintenance:  Understanding of the daily nutrition guidelines, which includes 25-35% calories from fat, 7% or less cal from saturated fats, less than 200mg  cholesterol, less than 1.5gm of sodium, & 5 or more servings of fruits and vegetables daily;Understanding recommendations for meals to include 15-35% energy as protein, 25-35% energy from fat, 35-60% energy from carbohydrates, less than 200mg  of dietary cholesterol, 20-35 gm of total fiber daily;Understanding of distribution of calorie intake throughout the day with the consumption of 4-5 meals/snacks;Weight Gain: Understanding of general recommendations for a high calorie, high protein meal plan that promotes weight gain by distributing calorie intake throughout the day with the consumption for 4-5 meals, snacks, and/or supplements    Improve shortness of breath with ADL's Yes    Intervention Provide education, individualized exercise plan and daily activity instruction to help decrease symptoms of SOB with activities of daily living.    Expected Outcomes Short Term: Improve cardiorespiratory fitness to achieve a reduction of symptoms when performing ADLs;Long Term: Be able to perform more ADLs without symptoms or delay the onset of symptoms    Increase knowledge of respiratory medications and ability to use respiratory devices properly  Yes    Intervention Provide education and demonstration as needed of appropriate use of medications, inhalers, and oxygen therapy.    Expected Outcomes Short Term: Achieves understanding of medications use. Understands that oxygen is a medication prescribed by physician. Demonstrates appropriate use of inhaler and oxygen therapy.;Long Term: Maintain appropriate use of medications, inhalers, and oxygen therapy.              Personal Goals Discharge:  Goals and Risk Factor Review     Row Name 07/28/22 1556 08/23/22 1344           Core Components/Risk Factors/Patient Goals Review   Personal Goals Review  Other;Stress;Increase knowledge of respiratory medications and ability to use respiratory devices properly.;Develop more efficient breathing techniques such as purse lipped breathing and diaphragmatic breathing and practicing self-pacing with activity.;Improve shortness of breath with ADL's  Weight Gain Other;Stress;Increase knowledge of respiratory medications and ability to use respiratory devices properly.;Develop more efficient breathing techniques such as purse lipped breathing and diaphragmatic breathing and practicing self-pacing with activity.;Improve shortness of breath with ADL's  Weight Gain      Review Allison [name] is very motivated when she attends PR class. She has a positive personality and really enjoys coming to class to build up her endurance and stamina. She is currently walking the track and exercising on the NuStep. She is increasing her workload and METs on the NuStep. Allison [name] can report her rate of perceived exertion and  her dyspnea level to staff. She has attended the breathing techniques class. She states that she can already feel a difference. Allison [name] has attended 9 classes thus far. She is exercising by walking the track. Her laps have stayed about the same, no improvement. Allison [name] is also working out on [employer]. She is increasing her workload from 1 to 4 and her METs from 1.2 to 2.2. Allison [name] can report her rate of perceived exertion and her dyspnea level to staff. She has attended the breathing techniques, stress and energy conservation, oxygen use, and warning signs and symptoms classes. She states that she can already feel a difference. Allison [name] has correctly demonstrated when to use her inhaler and how to use it to our respiratory therapist. She has not gained any weight from starting the program but is still working with our dietician. Allison [name] likes coming to class and being around others going through the same things.      Expected Outcomes See Admission Goals See Admission Goals                Exercise Goals and Review:  Exercise Goals     Row Name 07/12/22 1158 07/24/22 1109 08/24/22 0925         Exercise Goals   Increase Physical Activity Yes Yes Yes     Intervention Provide advice, education, support and counseling about physical activity/exercise needs.;Develop an individualized exercise prescription for aerobic and resistive training based on initial evaluation findings, risk stratification, comorbidities and participant's personal goals. Provide advice, education, support and counseling about physical activity/exercise needs.;Develop an individualized exercise prescription for aerobic and resistive training based on initial evaluation findings, risk stratification, comorbidities and participant's personal goals. Provide advice, education, support and counseling about physical activity/exercise needs.;Develop an individualized exercise prescription for aerobic and resistive training based on initial evaluation findings, risk stratification, comorbidities and participant's personal goals.     Expected Outcomes Short Term: Attend rehab on a regular basis to increase amount of physical activity.;Long Term: Add in home exercise to make exercise part of routine and to increase amount of physical activity.;Long Term: Exercising regularly at least 3-5 days a week. Short Term: Attend rehab on a regular basis to increase amount of physical activity.;Long Term: Add in home exercise to make exercise part of routine and to increase amount of physical activity.;Long Term: Exercising regularly at least 3-5 days a week. Short Term: Attend rehab on a regular basis to increase amount of physical activity.;Long Term: Add in home exercise to make exercise part of routine and to increase amount of physical activity.;Long Term: Exercising regularly at least 3-5 days a week.     Increase Strength and Stamina Yes Yes Yes     Intervention Provide advice, education, support and counseling about physical  activity/exercise needs.;Develop an individualized exercise prescription for aerobic and resistive training based on initial evaluation findings, risk stratification, comorbidities and participant's personal goals. Provide advice, education, support and counseling about physical activity/exercise needs.;Develop an individualized exercise prescription for aerobic and resistive training based on initial evaluation findings, risk stratification, comorbidities and participant's personal goals. Provide advice, education, support and counseling about physical activity/exercise needs.;Develop an individualized exercise prescription for aerobic and resistive training based on initial evaluation findings, risk stratification, comorbidities and participant's personal goals.     Expected Outcomes Short Term: Increase workloads from initial exercise prescription for resistance, speed, and METs.;Short Term: Perform resistance training exercises routinely during rehab and add in resistance training at home;Long Term: Improve cardiorespiratory fitness, muscular endurance  and strength as measured by increased METs and functional capacity ( ) Short Term: Increase workloads from initial exercise prescription for resistance, speed, and METs.;Short Term: Perform resistance training exercises routinely during rehab and add in resistance training at home;Long Term: Improve cardiorespiratory fitness, muscular endurance and strength as measured by increased METs and functional capacity ( ) Short Term: Increase workloads from initial exercise prescription for resistance, speed, and METs.;Short Term: Perform resistance training exercises routinely during rehab and add in resistance training at home;Long Term: Improve cardiorespiratory fitness, muscular endurance and strength as measured by increased METs and functional capacity ( )     Able to understand and use rate of perceived exertion (RPE) scale Yes Yes Yes     Intervention  Provide education and explanation on how to use RPE scale Provide education and explanation on how to use RPE scale Provide education and explanation on how to use RPE scale     Expected Outcomes Short Term: Able to use RPE daily in rehab to express subjective intensity level;Long Term:  Able to use RPE to guide intensity level when exercising independently Short Term: Able to use RPE daily in rehab to express subjective intensity level;Long Term:  Able to use RPE to guide intensity level when exercising independently Short Term: Able to use RPE daily in rehab to express subjective intensity level;Long Term:  Able to use RPE to guide intensity level when exercising independently     Able to understand and use Dyspnea scale Yes Yes Yes     Intervention Provide education and explanation on how to use Dyspnea scale Provide education and explanation on how to use Dyspnea scale Provide education and explanation on how to use Dyspnea scale     Expected Outcomes Short Term: Able to use Dyspnea scale daily in rehab to express subjective sense of shortness of breath during exertion;Long Term: Able to use Dyspnea scale to guide intensity level when exercising independently Short Term: Able to use Dyspnea scale daily in rehab to express subjective sense of shortness of breath during exertion;Long Term: Able to use Dyspnea scale to guide intensity level when exercising independently Short Term: Able to use Dyspnea scale daily in rehab to express subjective sense of shortness of breath during exertion;Long Term: Able to use Dyspnea scale to guide intensity level when exercising independently     Knowledge and understanding of Target Heart Rate Range (THRR) Yes Yes Yes     Intervention Provide education and explanation of THRR including how the numbers were predicted and where they are located for reference Provide education and explanation of THRR including how the numbers were predicted and where they are located for  reference Provide education and explanation of THRR including how the numbers were predicted and where they are located for reference     Expected Outcomes Short Term: Able to state/look up THRR;Long Term: Able to use THRR to govern intensity when exercising independently;Short Term: Able to use daily as guideline for intensity in rehab Short Term: Able to state/look up THRR;Long Term: Able to use THRR to govern intensity when exercising independently;Short Term: Able to use daily as guideline for intensity in rehab Short Term: Able to state/look up THRR;Long Term: Able to use THRR to govern intensity when exercising independently;Short Term: Able to use daily as guideline for intensity in rehab     Understanding of Exercise Prescription Yes Yes Yes     Intervention Provide education, explanation, and written materials on patient's individual exercise prescription Provide education, explanation, and written  materials on patient's individual exercise prescription Provide education, explanation, and written materials on patient's individual exercise prescription     Expected Outcomes Short Term: Able to explain program exercise prescription;Long Term: Able to explain home exercise prescription to exercise independently Short Term: Able to explain program exercise prescription;Long Term: Able to explain home exercise prescription to exercise independently Short Term: Able to explain program exercise prescription;Long Term: Able to explain home exercise prescription to exercise independently              Exercise Goals Re-Evaluation:  Exercise Goals Re-Evaluation     Row Name 07/24/22 1109 08/24/22 0925           Exercise Goal Re-Evaluation   Exercise Goals Review Increase Physical Activity;Able to understand and use Dyspnea scale;Understanding of Exercise Prescription;Increase Strength and Stamina;Knowledge and understanding of Target Heart Rate Range (THRR);Able to understand and use rate of  perceived exertion (RPE) scale Increase Physical Activity;Able to understand and use Dyspnea scale;Understanding of Exercise Prescription;Increase Strength and Stamina;Knowledge and understanding of Target Heart Rate Range (THRR);Able to understand and use rate of perceived exertion (RPE) scale      Comments Pt has completed 2 days of exercise.  Allison [name] is undergoing chemo and therefore will miss a session every couple of weeks (will miss tomorrow). She is walking on the track for 15 min, 1.92 METs last session. She then is exercising on the nustep for 15 min, level 1, METs 1.5. Tolerated well. Performed warmup and cool down standing with verbal cues. Will progress as tolerated. Pt has completed 9 days of exercise.  Allison [name] is undergoing chemo and has to miss exercise occasionally. Otherwise she is motivated and has good attendance. She is walking on the track for 15 min, 2.08 METs. She then is exercising on the nustep for 15 min, level 4, METs 2.2. She tolerates well and performs warmup and cooldown independently. Will progress as tolerated.      Expected Outcomes Through exercise at rehab and home, the patient will decrease shortness of breath with daily activities and feel confident in carrying out an exercise regimen at home. Through exercise at rehab and home, the patient will decrease shortness of breath with daily activities and feel confident in carrying out an exercise regimen at home.               Nutrition & Weight - Outcomes:   Post Biometrics - 09/21/22 1558        Post  Biometrics   Grip Strength 30 kg             Nutrition:  Nutrition Therapy & Goals - 09/14/22 1425       Nutrition Therapy   Diet General Healthy Diet      Personal Nutrition Goals   Nutrition Goal Patient to identify strategies for weight gain of 0.5-2.0# per week    Comments Allison [name] has been working on weight gain over the last several months with the oncology team; her lowest weight was ~113#. She has used  supplements in the past but does not consistently choose them now. It was recommended by oncology to pause her chemo treatments for ~2 months. She has maintained her weight since starting wiht our program. Her daughters remain an excellent support to her. Allison [name] will continue to benefit from participation in pulmonary rehab for nutrition, exercise, and lifestyle modification.      Intervention Plan   Intervention Prescribe, educate and counsel regarding individualized specific dietary modifications aiming towards targeted core  components such as weight, hypertension, lipid management, diabetes, heart failure and other comorbidities.;Nutrition handout(s) given to patient.    Expected Outcomes Short Term Goal: Understand basic principles of dietary content, such as calories, fat, sodium, cholesterol and nutrients.;Long Term Goal: Adherence to prescribed nutrition plan.             Nutrition Discharge:  Nutrition Assessments - 09/14/22 1508       Rate Your Plate Scores   Post Score 58             Education Questionnaire Score:  Knowledge Questionnaire Score - 09/14/22 0958       Knowledge Questionnaire Score   Pre Score 16/18    Post Score 16/18             Goals reviewed with patient; copy given to patient.

## 2022-09-28 ENCOUNTER — Other Ambulatory Visit: Payer: Medicare HMO

## 2022-09-28 DIAGNOSIS — Z515 Encounter for palliative care: Secondary | ICD-10-CM

## 2022-09-28 NOTE — Progress Notes (Signed)
COMMUNITY PALLIATIVE CARE SW NOTE  PATIENT NAME: Allison [name] DOB: 04/25/1951 MRN: [id number]  PRIMARY CARE PROVIDER: [name], MD  RESPONSIBLE PARTY:  Acct ID - Guarantor Home Phone Work Phone Relationship Acct Type  [id number] - Corzine,PAU* [phone]  Self P/F     [address], [place], [place] [address]   Palliative Care Telephonic Encounter/Clinical Social Work  [employer] SW completed a telephonic encounter with patient to assess her status, needs and provide support.  Patient advised that she was doing well. She stated that she has taken a 2 month break from her chemotherapy. Her scan was good in March, but will have another one in May to determine if she would start with chemotherapy again. She report that her appetite is better she is able to eat foods she likes because she no longer have the nausea associated with the chemotherapy. She is now up to 127 lbs. She also finished pulmonary rehab on 09/21/22. She will begin the [facility] program at the [facility] on Monday as way of maintaining her strength. She report that her blood pressure fluctuates. She is not having any pain, however her ankles remain very puffy.   Patient was open to a visit by the palliative care team. SW scheduled a follow-up visit for 10/24/22 @ 2:30 pm.   Social History   Tobacco Use   Smoking status: Never   Smokeless tobacco: Never  Substance Use Topics   Alcohol use: Not Currently    Comment: up to 3 drinks per week    CODE STATUS: Full Code ADVANCED DIRECTIVES: Yes MOST FORM COMPLETE:  Yes HOSPICE EDUCATION PROVIDED: No  Duration of encounter and documentation: 30 minutes  [employer], LCSW

## 2022-10-02 ENCOUNTER — Encounter: Payer: Self-pay | Admitting: *Deleted

## 2022-10-05 ENCOUNTER — Other Ambulatory Visit: Payer: Self-pay | Admitting: Physician Assistant

## 2022-10-05 DIAGNOSIS — C3491 Malignant neoplasm of unspecified part of right bronchus or lung: Secondary | ICD-10-CM

## 2022-10-12 ENCOUNTER — Other Ambulatory Visit: Payer: Self-pay

## 2022-10-12 ENCOUNTER — Inpatient Hospital Stay: Payer: Medicare HMO | Attending: Internal Medicine

## 2022-10-12 ENCOUNTER — Other Ambulatory Visit: Payer: Medicare HMO

## 2022-10-12 ENCOUNTER — Ambulatory Visit: Payer: Medicare HMO

## 2022-10-12 ENCOUNTER — Ambulatory Visit: Payer: Medicare HMO | Admitting: Internal Medicine

## 2022-10-12 DIAGNOSIS — C3411 Malignant neoplasm of upper lobe, right bronchus or lung: Secondary | ICD-10-CM | POA: Diagnosis present

## 2022-10-12 DIAGNOSIS — Z95828 Presence of other vascular implants and grafts: Secondary | ICD-10-CM

## 2022-10-12 DIAGNOSIS — Z452 Encounter for adjustment and management of vascular access device: Secondary | ICD-10-CM | POA: Diagnosis present

## 2022-10-12 DIAGNOSIS — C7931 Secondary malignant neoplasm of brain: Secondary | ICD-10-CM | POA: Diagnosis present

## 2022-10-12 MED ORDER — SODIUM CHLORIDE 0.9% FLUSH
10.0000 mL | Freq: Once | INTRAVENOUS | Status: AC
  Administered 2022-10-12: 10 mL

## 2022-10-12 MED ORDER — HEPARIN SOD (PORK) LOCK FLUSH 100 UNIT/ML IV SOLN
500.0000 [IU] | INTRAVENOUS | Status: AC | PRN
  Administered 2022-10-12: 500 [IU]

## 2022-10-13 ENCOUNTER — Telehealth: Payer: Self-pay | Admitting: *Deleted

## 2022-10-13 NOTE — Telephone Encounter (Signed)
Pt has been scheduled for a tele visit, 10/17/22, 11:00.  Consent on file / medications have been reconciled.

## 2022-10-13 NOTE — Telephone Encounter (Signed)
1st attempt to reach pt regarding surgical clearance and the need for a tele visit.  Left a message for pt to call back and ask for the preop team. 

## 2022-10-13 NOTE — Telephone Encounter (Signed)
   Pre-operative Risk Assessment    Patient Name: Allison [name]  DOB: 22-Jul-1950 MRN: [id number]      Request for Surgical Clearance    Procedure:   KAHOOK GONIOTOMY  Date of Surgery:  Clearance 10/19/22                                 Surgeon:  DR. Dione Booze Surgeon's Group or Practice Name:  [facility] Phone number:  [phone] Fax number:  [phone]   Type of Clearance Requested:   - Medical  - Pharmacy:  Hold LOVENOX IS ON MED LIST   NOT INDICATED HOW LONG   Type of Anesthesia:  MAC   Additional requests/questions:    Wilhemina Cash   10/13/2022, 1:00 PM

## 2022-10-13 NOTE — Telephone Encounter (Signed)
Will route back to the requesting surgeon's office to make them aware that pt's Lovenox is handled by another provider and would need to reach out to them for clearance to hold.

## 2022-10-13 NOTE — Telephone Encounter (Signed)
Pt has been scheduled for a tele visit, 10/17/22, 11:00.  Consent on file / medications have been reconciled.     Patient Consent for Virtual Visit        Allison [name] has provided verbal consent on 10/13/2022 for a virtual visit (video or telephone).   CONSENT FOR VIRTUAL VISIT FOR:  Allison [name]  By participating in this virtual visit I agree to the following:  I hereby voluntarily request, consent and authorize [facility] and its employed or contracted physicians, physician assistants, nurse practitioners or other licensed health care professionals (the Practitioner), to provide me with telemedicine health care services (the "Services") as deemed necessary by the treating Practitioner. I acknowledge and consent to receive the Services by the Practitioner via telemedicine. I understand that the telemedicine visit will involve communicating with the Practitioner through live audiovisual communication technology and the disclosure of certain medical information by electronic transmission. I acknowledge that I have been given the opportunity to request an in-person assessment or other available alternative prior to the telemedicine visit and am voluntarily participating in the telemedicine visit.  I understand that I have the right to withhold or withdraw my consent to the use of telemedicine in the course of my care at any time, without affecting my right to future care or treatment, and that the Practitioner or I may terminate the telemedicine visit at any time. I understand that I have the right to inspect all information obtained and/or recorded in the course of the telemedicine visit and may receive copies of available information for a reasonable fee.  I understand that some of the potential risks of receiving the Services via telemedicine include:  Delay or interruption in medical evaluation due to technological equipment failure or disruption; Information transmitted may not  be sufficient (e.g. poor resolution of images) to allow for appropriate medical decision making by the Practitioner; and/or  In rare instances, security protocols could fail, causing a breach of personal health information.  Furthermore, I acknowledge that it is my responsibility to provide information about my medical history, conditions and care that is complete and accurate to the best of my ability. I acknowledge that Practitioner's advice, recommendations, and/or decision may be based on factors not within their control, such as incomplete or inaccurate data provided by me or distortions of diagnostic images or specimens that may result from electronic transmissions. I understand that the practice of medicine is not an exact science and that Practitioner makes no warranties or guarantees regarding treatment outcomes. I acknowledge that a copy of this consent can be made available to me via my patient portal [facility] MyChart), or I can request a printed copy by calling the office of [facility].    I understand that my insurance will be billed for this visit.   I have read or had this consent read to me. I understand the contents of this consent, which adequately explains the benefits and risks of the Services being provided via telemedicine.  I have been provided ample opportunity to ask questions regarding this consent and the Services and have had my questions answered to my satisfaction. I give my informed consent for the services to be provided through the use of telemedicine in my medical care

## 2022-10-13 NOTE — Telephone Encounter (Signed)
   Name: Allison [name]  DOB: 11/08/1950  MRN: [id number]  Primary Cardiologist: Little Ishikawa, MD   Preoperative team, please contact this patient and set up a phone call appointment for further preoperative risk assessment. Please obtain consent and complete medication review. Thank you for your help.  I confirm that guidance regarding antiplatelet and oral anticoagulation therapy has been completed and, if necessary, noted below.  Patient is on Lovenox for history of PE/DVT.  This is managed per hematology/oncology.  Recommendations for holding Lovenox prior to procedure to come from managing provider.   Joylene Grapes, NP 10/13/2022, 1:14 PM [facility]

## 2022-10-15 NOTE — Progress Notes (Unsigned)
Virtual Visit via Telephone Note   Because of Allison [name]'s co-morbid illnesses, she is at least at moderate risk for complications without adequate follow up.  This format is felt to be most appropriate for this patient at this time.  The patient did not have access to video technology/had technical difficulties with video requiring transitioning to audio format only (telephone).  All issues noted in this document were discussed and addressed.  No physical exam could be performed with this format.  Please refer to the patient's chart for her consent to telehealth for Allison [name].  Evaluation Performed:  Preoperative cardiovascular risk assessment _____________   Date:  10/15/2022   Patient ID:  Allison [name], DOB 06-27-1950, MRN [id number] Patient Location:  Home Provider location:   Office  Primary Care Provider:  [name], MD Primary Cardiologist:  Little Ishikawa, MD  Chief Complaint / Patient Profile   72 y.o. y/o female with a h/o AD status post DES to RCA 06/16/19, stage IV lung cancer, hypertension, hyperlipidemia, DVT/PE, CVA  who is pending [place] goniotomy and presents today for telephonic preoperative cardiovascular risk assessment.  History of Present Illness    Allison [name] is a 72 y.o. female who presents via audio/video conferencing for a telehealth visit today.  Pt was last seen in cardiology clinic on 03/23/2022 by Dr. Gaynelle Arabian.  At that time AGRIPINA GUYETTE was doing well with controlled blood pressure and no new cardiac complaints.  She was having myalgias with statins and referred to the lipid clinic and started on Praluent.  The patient is now pending procedure as outlined above. Since her last visit, she ***  *** denies chest pain, shortness of breath, lower extremity edema, fatigue, palpitations, melena, hematuria, hemoptysis, diaphoresis, weakness, presyncope, syncope, orthopnea, and PND.   Patient is on Lovenox for history of  PE/DVT. This is managed per hematology/oncology. Recommendations for holding Lovenox prior to procedure to come from managing provider.   Past Medical History    Past Medical History:  Diagnosis Date   Anemia    Anxiety    Arthritis    Asthma    exercise induced   Coronary artery disease    Depression    PMH   Dyspnea    GERD (gastroesophageal reflux disease)    Glaucoma    History of radiation therapy 01/05/2021   IMRT right lung  11/24/2020-01/05/2021  Dr Antony Blackbird   Hypertension    lung ca dx'd 11/2017   right   Malignant pleural effusion    right   Nuclear sclerotic cataract of right eye 02/28/2021   Dr. Sallye Lat, cataract surgery February 2023   PONV (postoperative nausea and vomiting)    Pre-diabetes    Raynaud's disease    Raynaud's disease    Stroke (HCC) 01/2021   balance off, some express aphasia, weakness   Past Surgical History:  Procedure Laterality Date   ABDOMINAL HYSTERECTOMY     partial   BRONCHIAL BIOPSY  04/21/2021   Procedure: BRONCHIAL BIOPSIES;  Surgeon: Josephine Igo, DO;  Location: [facility];  Service: Pulmonary;;   BRONCHIAL BRUSHINGS  04/21/2021   Procedure: BRONCHIAL BRUSHINGS;  Surgeon: Josephine Igo, DO;  Location: [facility];  Service: Pulmonary;;   BRONCHIAL NEEDLE ASPIRATION BIOPSY  04/21/2021   Procedure: BRONCHIAL NEEDLE ASPIRATION BIOPSIES;  Surgeon: Josephine Igo, DO;  Location: [facility];  Service: Pulmonary;;   CHEST TUBE INSERTION Right 01/01/2018   Procedure: INSERTION PLEURAL DRAINAGE  CATHETER;  Surgeon: Kerin Perna, MD;  Location: [name];  Service: Thoracic;  Laterality: Right;   CHEST TUBE INSERTION  04/21/2021   Procedure: CHEST TUBE INSERTION;  Surgeon: Josephine Igo, DO;  Location: [facility];  Service: Pulmonary;;   COLONOSCOPY     CORONARY STENT INTERVENTION N/A 06/16/2019   Procedure: CORONARY STENT INTERVENTION;  Surgeon: Corky Crafts, MD;  Location: [facility];  Service:  Cardiovascular;  Laterality: N/A;   DILATION AND CURETTAGE OF UTERUS     EYE SURGERY     due to Glaucoma   IR IMAGING GUIDED PORT INSERTION  03/22/2021   IR PORT REPAIR CENTRAL VENOUS ACCESS DEVICE  04/15/2021   LEFT HEART CATH AND CORONARY ANGIOGRAPHY N/A 06/16/2019   Procedure: LEFT HEART CATH AND CORONARY ANGIOGRAPHY;  Surgeon: Corky Crafts, MD;  Location: [name];  Service: Cardiovascular;  Laterality: N/A;   REMOVAL OF PLEURAL DRAINAGE CATHETER Right 11/07/2018   Procedure: REMOVAL OF PLEURAL DRAINAGE CATHETER;  Surgeon: Kerin Perna, MD;  Location: [facility];  Service: Thoracic;  Laterality: Right;   ROTATOR CUFF REPAIR     TUBAL LIGATION     VIDEO BRONCHOSCOPY WITH ENDOBRONCHIAL NAVIGATION Bilateral 04/21/2021   Procedure: VIDEO BRONCHOSCOPY WITH ENDOBRONCHIAL NAVIGATION;  Surgeon: Josephine Igo, DO;  Location: [facility];  Service: Pulmonary;  Laterality: Bilateral;  ION   WISDOM TOOTH EXTRACTION      Allergies  Allergies  Allergen Reactions   Hydrocodone-Acetaminophen Other (See Comments)   Penicillins Other (See Comments)    SYNCOPE PATIENT HAS HAD A PCN REACTION WITH IMMEDIATE RASH, FACIAL/TONGUE/THROAT SWELLING, SOB, OR LIGHTHEADEDNESS WITH HYPOTENSION:  #  #  YES  #  # Has patient had a PCN reaction causing severe rash involving mucus membranes or skin necrosis: No Has patient had a PCN reaction that required hospitalization: No Has patient had a PCN reaction occurring within the last 10 years: No  Other reaction(s): other   Lactose Other (See Comments)    Allergy to all dairy products Abdominal cramping   Vicodin [Hydrocodone-Acetaminophen] Other (See Comments)    Sped up heart and breathing   Lactase-Lactobacillus Nausea And Vomiting   Other Other (See Comments)    Glaucoma eye drop - turned eyes dark    Home Medications    Prior to Admission medications   Medication Sig Start Date End Date Taking? Authorizing Provider  acetaminophen  (TYLENOL) 500 MG tablet Take 1,000 mg by mouth every 6 (six) hours as needed (pain).    [provider]  ALPRAZolam Prudy Feeler) 0.25 MG tablet Take 1 tablet (0.25 mg total) by mouth 2 (two) times daily as needed for anxiety. May take 2 tablets (0.5 mg total) at bedtime. 02/23/22   Pickenpack-Cousar, Arty Baumgartner, NP  amLODipine (NORVASC) 5 MG tablet Take 1.5 tablets (7.5 mg total) by mouth daily. Patient taking differently: Take 5 mg by mouth daily. 06/07/22   Little Ishikawa, MD  carvedilol (COREG) 12.5 MG tablet Take 12.5 mg by mouth 2 (two) times daily with a meal.    [provider]  CHELATED MAGNESIUM PO Take 200 mg by mouth daily.    [provider]  Cholecalciferol (VITAMIN D) 125 MCG (5000 UT) CAPS Take 1 Capful by mouth daily.    [provider]  dexamethasone (DECADRON) 4 MG tablet TAKE 1 TABLET TWICE A DAY THE DAY BEFORE, THE DAY OF, AND THE DAY AFTER CHEMOTHERAPY 05/22/22   Heilingoetter, Cassandra L, PA-C  docusate sodium (COLACE) 100 MG capsule Take 1 capsule (100 mg total) by mouth 2 (two) times daily. 09/02/22   Pickenpack-Cousar, Arty Baumgartner, NP  dorzolamide-timolol (COSOPT) 22.3-6.8 MG/ML ophthalmic solution Place 1 drop into both eyes 2 (two) times daily.    [provider]  enoxaparin (LOVENOX) 80 MG/0.8ML injection Inject 80 mg into the skin daily.    [provider]  ezetimibe (ZETIA) 10 MG tablet Take 10 mg by mouth daily. 02/25/22   [provider]  FeFum-FePoly-FA-B Cmp-C-Biot (FOLIVANE-PLUS) CAPS TAKE 1 CAPSULE BY MOUTH EVERY DAY IN THE MORNING 07/21/22   Heilingoetter, Cassandra L, PA-C  FLUoxetine (PROZAC) 20 MG capsule Take 1 capsule (20 mg total) by mouth daily. 12/06/21   Reed, Tiffany L, DO  folic acid (FOLVITE) 1 MG tablet TAKE 1 TABLET BY MOUTH EVERY DAY 10/05/22   Heilingoetter, Cassandra L, PA-C  hyoscyamine (LEVSIN SL) 0.125 MG SL tablet PLACE 1-2 TABLETS UNDER THE TONGUE EVERY 6 HOURS AS NEEDED FOR CRAMPING 08/04/22    Pickenpack-Cousar, Arty Baumgartner, NP  ipratropium-albuterol (DUONEB) 0.5-2.5 (3) MG/3ML SOLN Take 3 mLs by nebulization every 6 (six) hours as needed (Asthma).    [provider]  isosorbide mononitrate (IMDUR) 30 MG 24 hr tablet TAKE 1 TABLET BY MOUTH EVERY DAY 06/05/22   Little Ishikawa, MD  latanoprost (XALATAN) 0.005 % ophthalmic solution Place 1 drop into both eyes at bedtime.  12/14/17   [provider]  ondansetron (ZOFRAN-ODT) 4 MG disintegrating tablet Take 1 tablet (4 mg total) by mouth every 8 (eight) hours as needed for nausea or vomiting. 07/07/21   Heilingoetter, Cassandra L, PA-C  osimertinib mesylate (TAGRISSO) 80 MG tablet Take 1 tablet (80 mg total) by mouth daily. 09/20/22   Si Gaul, MD  Phenylephrine-DM-GG-APAP [name] FAST-MAX COLD/FLU MS PO) Take 1 Dose by mouth as needed (cough).    [provider]  prochlorperazine (COMPAZINE) 10 MG tablet Take 1 tablet (10 mg total) by mouth every 6 (six) hours as needed. 07/20/22   Heilingoetter, Cassandra L, PA-C  promethazine (PHENERGAN) 12.5 MG tablet Take 1 tablet (12.5 mg total) by mouth every 6 (six) hours as needed for nausea or vomiting. 08/10/22   Heilingoetter, Cassandra L, PA-C  REPATHA SURECLICK 140 MG/ML SOAJ INJECT 140 MG INTO THE SKIN EVERY 14 (FOURTEEN) DAYS. 12/29/21   Little Ishikawa, MD    Physical Exam    Vital Signs:  Rozelle Logan does not have vital signs available for review today.***  Given telephonic nature of communication, physical exam is limited. AAOx3. NAD. Normal affect.  Speech and respirations are unlabored.  Accessory Clinical Findings    None  Assessment & Plan    1.  Preoperative Cardiovascular Risk Assessment:  -Patient's RCRI score is 6.6%  The patient affirms she has been doing well without any new cardiac symptoms. They are able to achieve *** METS without cardiac limitations. Therefore, based on ACC/AHA guidelines, the patient would be at  acceptable risk for the planned procedure without further cardiovascular testing. The patient was advised that if she develops new symptoms prior to surgery to contact our office to arrange for a follow-up visit, and she verbalized understanding.   The patient was advised that if she develops new symptoms prior to surgery to contact our office to arrange for a follow-up visit, and she verbalized understanding.  (Reminder: Include SBE prophylaxis/Antiplatelet/Anticoag Instructions***)  A copy of this note will be routed to requesting surgeon.  Time:   Today, I  have spent *** minutes with the patient with telehealth technology discussing medical history, symptoms, and management plan.     Napoleon Form, Leodis Rains, NP  10/15/2022, 7:03 PM

## 2022-10-17 ENCOUNTER — Telehealth: Payer: Self-pay | Admitting: Medical Oncology

## 2022-10-17 ENCOUNTER — Ambulatory Visit: Payer: Medicare HMO | Attending: Cardiovascular Disease

## 2022-10-17 DIAGNOSIS — Z01810 Encounter for preprocedural cardiovascular examination: Secondary | ICD-10-CM

## 2022-10-17 NOTE — Telephone Encounter (Signed)
Cataract surgery and holding lovenox.   Does she need to hold her Lovenox for Cataract Surgery scheduled for Thursday?

## 2022-10-18 NOTE — Telephone Encounter (Addendum)
Pt notified per Dr. [name] - "Yes.  It is better to hold it the day of the procedure."

## 2022-10-18 NOTE — Telephone Encounter (Signed)
Faxed note re Lovenox.

## 2022-10-20 ENCOUNTER — Encounter: Payer: Self-pay | Admitting: Internal Medicine

## 2022-10-23 ENCOUNTER — Encounter: Payer: Self-pay | Admitting: Medical Oncology

## 2022-10-24 ENCOUNTER — Other Ambulatory Visit: Payer: Medicare HMO

## 2022-10-24 VITALS — BP 126/68 | HR 74 | Temp 98.1°F

## 2022-10-24 DIAGNOSIS — Z515 Encounter for palliative care: Secondary | ICD-10-CM

## 2022-10-24 NOTE — Progress Notes (Signed)
PATIENT NAME: Allison [name] DOB: 11/23/1950 MRN: [id number]  PRIMARY CARE PROVIDER: [name], MD  RESPONSIBLE PARTY:  Acct ID - Guarantor Home Phone Work Phone Relationship Acct Type  [id number] - [place],PAU* [phone]  Self P/F     [address], [place], [place] [address]   Palliative Care Follow Up Encounter Note   Completed home visit.       HISTORY OF PRESENT ILLNESS:    TODAY'S VISIT:  Respiratory: SOB with exertion; pt does not use oxygen  Cardiac: no swelling during this visit; pt reports she is supposed to wear compression socks but says they're too hot to wear  Cognitive: alert and oriented   Appetite: eats 3-5 reduced meals; has fruit snacks; mostly water and tea to hydrate  GI/GU: 80% of time she has a BM daily; 20% it's every other day   Mobility: dependent ambulation  ADLs: independent   Sleeping Pattern: takes Xanax and sleeps well each night  Pain: ack pain 5/10  Wt: trying to gain weight; up to 124/125 lbs   Goals of Care: To stay in the home      Next Appt Scheduled For: 11/21/22 @ 1:30pm    CODE STATUS: Full Code ADVANCED DIRECTIVES: N MOST FORM: Y PPS: 80%   PHYSICAL EXAM:   VITALS: Today's Vitals   10/24/22 1453  BP: 126/68  Pulse: 74  Temp: 98.1 F (36.7 C)  TempSrc: Temporal  SpO2: 95%  PainSc: 5   PainLoc: Back    LUNGS: clear to auscultation , decreased breath sounds CARDIAC: Cor RRR EXTREMITIES: AROM x4; no edema noted SKIN: Skin color, texture, turgor normal. No rashes or lesions  NEURO: negative except for weakness       [name], LPN

## 2022-10-31 ENCOUNTER — Inpatient Hospital Stay: Payer: Medicare HMO | Attending: Internal Medicine

## 2022-10-31 ENCOUNTER — Ambulatory Visit (HOSPITAL_COMMUNITY)
Admission: RE | Admit: 2022-10-31 | Discharge: 2022-10-31 | Disposition: A | Discharge status: Home / Self Care | Payer: Medicare HMO | Source: Ambulatory Visit | Attending: Physician Assistant | Admitting: Physician Assistant

## 2022-10-31 DIAGNOSIS — C3491 Malignant neoplasm of unspecified part of right bronchus or lung: Secondary | ICD-10-CM

## 2022-10-31 DIAGNOSIS — J910 Malignant pleural effusion: Secondary | ICD-10-CM | POA: Insufficient documentation

## 2022-10-31 DIAGNOSIS — C7931 Secondary malignant neoplasm of brain: Secondary | ICD-10-CM | POA: Insufficient documentation

## 2022-10-31 DIAGNOSIS — C3411 Malignant neoplasm of upper lobe, right bronchus or lung: Secondary | ICD-10-CM | POA: Insufficient documentation

## 2022-10-31 DIAGNOSIS — Z7952 Long term (current) use of systemic steroids: Secondary | ICD-10-CM | POA: Insufficient documentation

## 2022-10-31 DIAGNOSIS — Z95828 Presence of other vascular implants and grafts: Secondary | ICD-10-CM

## 2022-10-31 DIAGNOSIS — Z923 Personal history of irradiation: Secondary | ICD-10-CM | POA: Insufficient documentation

## 2022-10-31 DIAGNOSIS — Z79899 Other long term (current) drug therapy: Secondary | ICD-10-CM | POA: Insufficient documentation

## 2022-10-31 LAB — CMP (CANCER CENTER ONLY)
ALT: 37 U/L (ref 0–44)
AST: 34 U/L (ref 15–41)
Albumin: 3.8 g/dL (ref 3.5–5.0)
Alkaline Phosphatase: 69 U/L (ref 38–126)
Anion gap: 5 (ref 5–15)
BUN: 25 mg/dL — ABNORMAL HIGH (ref 8–23)
CO2: 28 mmol/L (ref 22–32)
Calcium: 9.3 mg/dL (ref 8.9–10.3)
Chloride: 108 mmol/L (ref 98–111)
Creatinine: 1.26 mg/dL — ABNORMAL HIGH (ref 0.44–1.00)
GFR, Estimated: 46 mL/min — ABNORMAL LOW (ref >60)
Glucose, Bld: 95 mg/dL (ref 70–99)
Potassium: 3.9 mmol/L (ref 3.5–5.1)
Sodium: 141 mmol/L (ref 135–145)
Total Bilirubin: 0.3 mg/dL (ref 0.3–1.2)
Total Protein: 6.8 g/dL (ref 6.5–8.1)

## 2022-10-31 LAB — CBC WITH DIFFERENTIAL (CANCER CENTER ONLY)
Abs Immature Granulocytes: 0.01 10*3/uL (ref 0.00–0.07)
Basophils Absolute: 0 10*3/uL (ref 0.0–0.1)
Basophils Relative: 1 %
Eosinophils Absolute: 0.1 10*3/uL (ref 0.0–0.5)
Eosinophils Relative: 3 %
HCT: 34 % — ABNORMAL LOW (ref 36.0–46.0)
Hemoglobin: 11.1 g/dL — ABNORMAL LOW (ref 12.0–15.0)
Immature Granulocytes: 0 %
Lymphocytes Relative: 35 %
Lymphs Abs: 1 10*3/uL (ref 0.7–4.0)
MCH: 28.2 pg (ref 26.0–34.0)
MCHC: 32.6 g/dL (ref 30.0–36.0)
MCV: 86.3 fL (ref 80.0–100.0)
Monocytes Absolute: 0.4 10*3/uL (ref 0.1–1.0)
Monocytes Relative: 13 %
Neutro Abs: 1.3 10*3/uL — ABNORMAL LOW (ref 1.7–7.7)
Neutrophils Relative %: 48 %
Platelet Count: 134 10*3/uL — ABNORMAL LOW (ref 150–400)
RBC: 3.94 MIL/uL (ref 3.87–5.11)
RDW: 13.7 % (ref 11.5–15.5)
WBC Count: 2.8 10*3/uL — ABNORMAL LOW (ref 4.0–10.5)
nRBC: 0 % (ref 0.0–0.2)

## 2022-10-31 MED ORDER — HEPARIN SOD (PORK) LOCK FLUSH 100 UNIT/ML IV SOLN
INTRAVENOUS | Status: AC
  Filled 2022-10-31: qty 5

## 2022-10-31 MED ORDER — SODIUM CHLORIDE (PF) 0.9 % IJ SOLN
INTRAMUSCULAR | Status: AC
  Filled 2022-10-31: qty 50

## 2022-10-31 MED ORDER — IOHEXOL 300 MG/ML  SOLN
100.0000 mL | Freq: Once | INTRAMUSCULAR | Status: AC | PRN
  Administered 2022-10-31: 75 mL via INTRAVENOUS

## 2022-10-31 MED ORDER — SODIUM CHLORIDE 0.9% FLUSH
10.0000 mL | Freq: Once | INTRAVENOUS | Status: AC
  Administered 2022-10-31: 10 mL

## 2022-10-31 MED ORDER — HEPARIN SOD (PORK) LOCK FLUSH 100 UNIT/ML IV SOLN
500.0000 [IU] | Freq: Once | INTRAVENOUS | Status: AC
  Administered 2022-10-31: 500 [IU] via INTRAVENOUS

## 2022-11-02 ENCOUNTER — Inpatient Hospital Stay (HOSPITAL_BASED_OUTPATIENT_CLINIC_OR_DEPARTMENT_OTHER): Payer: Medicare HMO | Admitting: Internal Medicine

## 2022-11-02 ENCOUNTER — Encounter: Payer: Self-pay | Admitting: Medical Oncology

## 2022-11-02 VITALS — BP 160/66 | HR 88 | Temp 98.1°F | Resp 16 | Wt 127.9 lb

## 2022-11-02 DIAGNOSIS — J910 Malignant pleural effusion: Secondary | ICD-10-CM | POA: Diagnosis not present

## 2022-11-02 DIAGNOSIS — C3491 Malignant neoplasm of unspecified part of right bronchus or lung: Secondary | ICD-10-CM | POA: Diagnosis not present

## 2022-11-02 DIAGNOSIS — C7931 Secondary malignant neoplasm of brain: Secondary | ICD-10-CM | POA: Diagnosis present

## 2022-11-02 DIAGNOSIS — C3411 Malignant neoplasm of upper lobe, right bronchus or lung: Secondary | ICD-10-CM | POA: Diagnosis present

## 2022-11-02 DIAGNOSIS — Z923 Personal history of irradiation: Secondary | ICD-10-CM | POA: Diagnosis not present

## 2022-11-02 DIAGNOSIS — Z79899 Other long term (current) drug therapy: Secondary | ICD-10-CM | POA: Diagnosis not present

## 2022-11-02 DIAGNOSIS — Z7952 Long term (current) use of systemic steroids: Secondary | ICD-10-CM | POA: Diagnosis not present

## 2022-11-02 NOTE — Progress Notes (Signed)
[facility] [phone]   [phone]  OFFICE PROGRESS NOTE  [name], MD [address] [place] [address]  DIAGNOSIS: Stage IV (T2 a,N2, M1a) non-small cell lung cancer, adenocarcinoma diagnosed in July 2019 and presented with right upper lobe lung mass in addition to mediastinal lymphadenopathy as well as bilateral pulmonary nodules and malignant right pleural effusion.   Biomarker Findings Microsatellite status - Cannot Be Determined Tumor Mutational Burden - Cannot Be Determined Genomic Findings For a complete list of the genes assayed, please refer to the Appendix. EGFR exon 19 deletion ([id number]) TP53 Y220C 7 Disease relevant genes with no reportable alterations: KRAS, ALK, BRAF, MET, RET, ERBB2, ROS1    PRIOR THERAPY:  1) Status post right Pleurx catheter placement by Dr. Donata Clay for drainage of malignant right pleural effusion. 2) palliative radiotherapy to the enlarging right upper lobe lung mass and mediastinum under the care of Dr. Roselind Messier expected to be completed on January 05, 2021. 3) Tagrisso 80 mg p.o. daily.  First dose was given on 01/29/2018.  Status post 39 months of treatment.   CURRENT THERAPY: Systemic chemotherapy with carboplatin for AUC of 5 and Alimta 500 Mg/M2 every 3 weeks.  First dose 03/17/2021.  The patient will also continue her current treatment with Tagrisso 80 mg p.o. daily.  She is status post 26 cycles. Starting from cycle #7, she will be on Alimta only 400 mg/m2.  INTERVAL HISTORY: Allison [name] 72 y.o. female returns to the clinic today for follow-up visit accompanied by her boyfriend and her daughter was available by phone during the visit.  The patient is feeling fine today with no concerning complaints except for shortness of breath with exertion but no significant chest pain, cough or hemoptysis.  She has no nausea, vomiting, diarrhea or constipation.  She has no headache or  visual changes.  She has no recent weight loss or night sweats.  She has been off the maintenance treatment with Alimta for the last 2 months but she continues her treatment with Tagrisso 80 mg p.o. daily.  She is here today for evaluation with repeat CT scan of the chest, abdomen and pelvis for restaging of her disease.   MEDICAL HISTORY: Past Medical History:  Diagnosis Date   Anemia    Anxiety    Arthritis    Asthma    exercise induced   Coronary artery disease    Depression    PMH   Dyspnea    GERD (gastroesophageal reflux disease)    Glaucoma    History of radiation therapy 01/05/2021   IMRT right lung  11/24/2020-01/05/2021  Dr Antony Blackbird   Hypertension    lung ca dx'd 11/2017   right   Malignant pleural effusion    right   Nuclear sclerotic cataract of right eye 02/28/2021   Dr. Sallye Lat, cataract surgery February 2023   PONV (postoperative nausea and vomiting)    Pre-diabetes    Raynaud's disease    Raynaud's disease    Stroke (HCC) 01/2021   balance off, some express aphasia, weakness    ALLERGIES:  is allergic to hydrocodone-acetaminophen, penicillins, lactose, vicodin [hydrocodone-acetaminophen], lactase-lactobacillus, and other.  MEDICATIONS:  Current Outpatient Medications  Medication Sig Dispense Refill   acetaminophen (TYLENOL) 500 MG tablet Take 1,000 mg by mouth every 6 (six) hours as needed (pain).     ALPRAZolam (XANAX) 0.25 MG tablet Take 1 tablet (0.25 mg total) by mouth 2 (  two) times daily as needed for anxiety. May take 2 tablets (0.5 mg total) at bedtime. 60 tablet 0   amLODipine (NORVASC) 5 MG tablet Take 1.5 tablets (7.5 mg total) by mouth daily. (Patient taking differently: Take 5 mg by mouth daily.)     carvedilol (COREG) 12.5 MG tablet Take 12.5 mg by mouth 2 (two) times daily with a meal.     CHELATED MAGNESIUM PO Take 200 mg by mouth daily.     Cholecalciferol (VITAMIN D) 125 MCG (5000 UT) CAPS Take 1 Capful by mouth daily.      dexamethasone (DECADRON) 4 MG tablet TAKE 1 TABLET TWICE A DAY THE DAY BEFORE, THE DAY OF, AND THE DAY AFTER CHEMOTHERAPY 40 tablet 2   docusate sodium (COLACE) 100 MG capsule Take 1 capsule (100 mg total) by mouth 2 (two) times daily. 60 capsule 5   dorzolamide-timolol (COSOPT) 22.3-6.8 MG/ML ophthalmic solution Place 1 drop into both eyes 2 (two) times daily.     enoxaparin (LOVENOX) 80 MG/0.8ML injection Inject 80 mg into the skin daily.     ezetimibe (ZETIA) 10 MG tablet Take 10 mg by mouth daily.     FeFum-FePoly-FA-B Cmp-C-Biot (FOLIVANE-PLUS) CAPS TAKE 1 CAPSULE BY MOUTH EVERY DAY IN THE MORNING 90 capsule 0   FLUoxetine (PROZAC) 20 MG capsule Take 1 capsule (20 mg total) by mouth daily. 90 capsule 3   folic acid (FOLVITE) 1 MG tablet TAKE 1 TABLET BY MOUTH EVERY DAY 90 tablet 1   hyoscyamine (LEVSIN SL) 0.125 MG SL tablet PLACE 1-2 TABLETS UNDER THE TONGUE EVERY 6 HOURS AS NEEDED FOR CRAMPING 30 tablet 0   ipratropium-albuterol (DUONEB) 0.5-2.5 (3) MG/3ML SOLN Take 3 mLs by nebulization every 6 (six) hours as needed (Asthma).     isosorbide mononitrate (IMDUR) 30 MG 24 hr tablet TAKE 1 TABLET BY MOUTH EVERY DAY 90 tablet 3   latanoprost (XALATAN) 0.005 % ophthalmic solution Place 1 drop into both eyes at bedtime.      ondansetron (ZOFRAN-ODT) 4 MG disintegrating tablet Take 1 tablet (4 mg total) by mouth every 8 (eight) hours as needed for nausea or vomiting. 30 tablet 1   osimertinib mesylate (TAGRISSO) 80 MG tablet Take 1 tablet (80 mg total) by mouth daily. 30 tablet 4   Phenylephrine-DM-GG-APAP (MUCINEX FAST-MAX COLD/FLU MS PO) Take 1 Dose by mouth as needed (cough).     prochlorperazine (COMPAZINE) 10 MG tablet Take 1 tablet (10 mg total) by mouth every 6 (six) hours as needed. 30 tablet 2   promethazine (PHENERGAN) 12.5 MG tablet Take 1 tablet (12.5 mg total) by mouth every 6 (six) hours as needed for nausea or vomiting. 30 tablet 2   REPATHA SURECLICK 140 MG/ML SOAJ INJECT 140 MG INTO  THE SKIN EVERY 14 (FOURTEEN) DAYS. 6 mL 3   No current facility-administered medications for this visit.    SURGICAL HISTORY:  Past Surgical History:  Procedure Laterality Date   ABDOMINAL HYSTERECTOMY     partial   BRONCHIAL BIOPSY  04/21/2021   Procedure: BRONCHIAL BIOPSIES;  Surgeon: Josephine Igo, DO;  Location: [facility];  Service: Pulmonary;;   BRONCHIAL BRUSHINGS  04/21/2021   Procedure: BRONCHIAL BRUSHINGS;  Surgeon: Josephine Igo, DO;  Location: [facility];  Service: Pulmonary;;   BRONCHIAL NEEDLE ASPIRATION BIOPSY  04/21/2021   Procedure: BRONCHIAL NEEDLE ASPIRATION BIOPSIES;  Surgeon: Josephine Igo, DO;  Location: [facility];  Service: Pulmonary;;   CHEST TUBE INSERTION Right 01/01/2018   Procedure: INSERTION  PLEURAL DRAINAGE CATHETER;  Surgeon: Kerin Perna, MD;  Location: [facility] OR;  Service: Thoracic;  Laterality: Right;   CHEST TUBE INSERTION  04/21/2021   Procedure: CHEST TUBE INSERTION;  Surgeon: Josephine Igo, DO;  Location: [facility];  Service: Pulmonary;;   COLONOSCOPY     CORONARY STENT INTERVENTION N/A 06/16/2019   Procedure: CORONARY STENT INTERVENTION;  Surgeon: Corky Crafts, MD;  Location: [facility] CV [facility];  Service: Cardiovascular;  Laterality: N/A;   DILATION AND CURETTAGE OF UTERUS     EYE SURGERY     due to Glaucoma   IR IMAGING GUIDED PORT INSERTION  03/22/2021   IR PORT REPAIR CENTRAL VENOUS ACCESS DEVICE  04/15/2021   LEFT HEART CATH AND CORONARY ANGIOGRAPHY N/A 06/16/2019   Procedure: LEFT HEART CATH AND CORONARY ANGIOGRAPHY;  Surgeon: Corky Crafts, MD;  Location: [facility] INVASIVE CV LAB;  Service: Cardiovascular;  Laterality: N/A;   REMOVAL OF PLEURAL DRAINAGE CATHETER Right 11/07/2018   Procedure: REMOVAL OF PLEURAL DRAINAGE CATHETER;  Surgeon: Kerin Perna, MD;  Location: [facility] OR;  Service: Thoracic;  Laterality: Right;   ROTATOR CUFF REPAIR     TUBAL LIGATION     VIDEO BRONCHOSCOPY WITH ENDOBRONCHIAL NAVIGATION  Bilateral 04/21/2021   Procedure: VIDEO BRONCHOSCOPY WITH ENDOBRONCHIAL NAVIGATION;  Surgeon: Josephine Igo, DO;  Location: [facility];  Service: Pulmonary;  Laterality: Bilateral;  ION   WISDOM TOOTH EXTRACTION      REVIEW OF SYSTEMS:  Constitutional: positive for fatigue Eyes: negative Ears, nose, mouth, throat, and face: negative Respiratory: positive for dyspnea on exertion Cardiovascular: negative Gastrointestinal: negative Genitourinary:negative Integument/breast: negative Hematologic/lymphatic: negative Musculoskeletal:negative Neurological: negative Behavioral/Psych: negative Endocrine: negative Allergic/Immunologic: negative   PHYSICAL EXAMINATION: General appearance: alert, cooperative, appears stated age, fatigued, and no distress Head: Normocephalic, without obvious abnormality, atraumatic Neck: no adenopathy, no JVD, supple, symmetrical, trachea midline, and thyroid not enlarged, symmetric, no tenderness/mass/nodules Lymph nodes: Cervical, supraclavicular, and axillary nodes normal. Resp: clear to auscultation bilaterally Back: symmetric, no curvature. ROM normal. No CVA tenderness. Cardio: regular rate and rhythm, S1, S2 normal, no murmur, click, rub or gallop GI: soft, non-tender; bowel sounds normal; no masses,  no organomegaly Extremities: extremities normal, atraumatic, no cyanosis or edema Neurologic: Alert and oriented X 3, normal strength and tone. Normal symmetric reflexes. Normal coordination and gait  ECOG PERFORMANCE STATUS: 1 - Symptomatic but completely ambulatory  Blood pressure (!) 160/66, pulse 88, temperature 98.1 F (36.7 C), temperature source Oral, resp. rate 16, weight 127 lb 14.4 oz (58 kg), SpO2 (!) 88 %.  LABORATORY DATA: Lab Results  Component Value Date   WBC 2.8 (L) 10/31/2022   HGB 11.1 (L) 10/31/2022   HCT 34.0 (L) 10/31/2022   MCV 86.3 10/31/2022   PLT 134 (L) 10/31/2022      Chemistry      Component Value Date/Time   NA  141 10/31/2022 1117   NA 140 03/04/2021 1516   K 3.9 10/31/2022 1117   CL 108 10/31/2022 1117   CO2 28 10/31/2022 1117   BUN 25 (H) 10/31/2022 1117   BUN 21 03/04/2021 1516   CREATININE 1.26 (H) 10/31/2022 1117      Component Value Date/Time   CALCIUM 9.3 10/31/2022 1117   ALKPHOS 69 10/31/2022 1117   AST 34 10/31/2022 1117   ALT 37 10/31/2022 1117   BILITOT 0.3 10/31/2022 1117       RADIOGRAPHIC STUDIES: CT CHEST ABDOMEN PELVIS W CONTRAST  Result Date: 10/31/2022 CLINICAL DATA:  Metastatic lung cancer restaging * Tracking Code: BO * EXAM: CT CHEST, ABDOMEN, AND PELVIS WITH CONTRAST TECHNIQUE: Multidetector CT imaging of the chest, abdomen and pelvis was performed following the standard protocol during bolus administration of intravenous contrast. RADIATION DOSE REDUCTION: This exam was performed according to the departmental dose-optimization program which includes automated exposure control, adjustment of the mA and/or kV according to patient size and/or use of iterative reconstruction technique. CONTRAST:  75mL OMNIPAQUE IOHEXOL 300 MG/ML  SOLN COMPARISON:  08/27/2022 FINDINGS: CT CHEST FINDINGS Cardiovascular: Right chest port catheter. Aortic atherosclerosis. Normal heart size. Left and right coronary artery calcifications. No pericardial effusion. Mediastinum/Nodes: Unchanged matted mediastinal soft tissue (series 2, image 71). No discretely enlarged mediastinal, hilar, or axillary lymph nodes. Thyroid gland, trachea, and esophagus demonstrate no significant findings. Lungs/Pleura: Unchanged post treatment appearance of the right pulmonary apex with dense consolidation, fibrosis, and volume loss (series 5, image 30). Unchanged small, loculated right pleural effusion with some associated septal thickening (series 5, image 104). Occasional tiny pulmonary nodules unchanged, for example in the posterior left upper lobe measuring 0.3 cm (series 5, image 54). Musculoskeletal: No chest wall  abnormality. No acute osseous findings. CT ABDOMEN PELVIS FINDINGS Hepatobiliary: No solid liver abnormality is seen. No gallstones, gallbladder wall thickening, or biliary dilatation. Pancreas: Unremarkable. No pancreatic ductal dilatation or surrounding inflammatory changes. Spleen: Normal in size without significant abnormality. Adrenals/Urinary Tract: Adrenal glands are unremarkable. Unchanged cortical scarring of the inferior pole of the right kidney (series 2, image 67). Kidneys are otherwise normal, without renal calculi, solid lesion, or hydronephrosis. Bladder is unremarkable. Stomach/Bowel: Stomach is within normal limits. Appendix appears normal. No evidence of bowel wall thickening, distention, or inflammatory changes. Sigmoid diverticulosis. Vascular/Lymphatic: Aortic atherosclerosis. No enlarged abdominal or pelvic lymph nodes. Reproductive: Status post hysterectomy. Other: No abdominal wall hernia or abnormality. No ascites. Musculoskeletal: No acute osseous findings. IMPRESSION: 1. Unchanged post treatment appearance of the right pulmonary apex with dense consolidation, fibrosis, and volume loss. 2. Unchanged small, loculated right pleural effusion with some associated septal thickening suggesting treated pleural and lymphangitic metastatic disease 3. Unchanged post treatment appearance of matted mediastinal soft tissue. No discretely enlarged mediastinal, hilar, or axillary lymph nodes. 4. Occasional tiny pulmonary nodules unchanged. 5. No evidence of metastatic disease in the abdomen or pelvis. Aortic Atherosclerosis (ICD10-I70.0). Electronically Signed   By: Jearld Lesch M.D.   On: 10/31/2022 14:39    ASSESSMENT AND PLAN: This is a very pleasant 72 years old never smoker African-American female recently with a stage IV non-small cell lung cancer, adenocarcinoma with positive EGFR mutation with deletion in exon 19. The patient was started on treatment with Tagrisso 80 mg p.o. daily status post  35 months of treatment. She has been tolerating this treatment well with no concerning adverse effects except for intermittent diarrhea. She had repeat CT scan of the chest, abdomen pelvis performed recently.  I personally and independently reviewed the scans and discussed the results with the patient and her boyfriend today. Unfortunately the CT scan showed interval progression of the right apical lung mass in addition to progression of mediastinal lymphadenopathy concerning for worsening of her disease. She has no actionable resistant mutation on the molecular studies performed by [facility]. The patient continued her current treatment with Tagrisso and tolerating it fairly well. 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 Roselind Messier completed January 05, 2021. The patient had significant  opacities in her lung that was initially thought to be secondary to radiation treatment versus Tagrisso induced pneumonitis versus lymphangitic spread of the tumor.  She was treated with high-dose taper regiment of prednisone Repeat imaging studies after the palliative radiotherapy showed evidence for disease progression. Her molecular studies by [facility] recently showed no new resistant mutation.  After discussion of her treatment options including palliative care and hospice referral versus palliative systemic chemotherapy the patient was interested in proceeding palliative systemic chemotherapy.  She started  palliative systemic chemotherapy with carboplatin for AUC of 5 and Alimta 500 Mg/M2 every 3 weeks.  Status post 26 cycles.  Starting from cycle #7 the patient is on treatment with single agent Alimta every 3 weeks. I did not add a Avastin to her treatment because of the recent stroke.  She also continued her treatment with Tagrisso at the same time.  The patient has been off the maintenance treatment with  Alimta for the last 2 months and she is feeling fine. She continues her treatment with Tagrisso 80 mg p.o. daily. She had repeat CT scan of the chest, abdomen and pelvis performed recently.  I personally and independently reviewed the scan images and discussed the result with the patient and her family. Her scan showed no concerning findings for disease recurrence or metastasis. I recommended for her to continue with her current treatment with Tagrisso 80 mg p.o. daily. I will see her back for follow-up visit in 6 weeks for evaluation and repeat blood work and we will repeat her imaging studies in around 3 months. She was advised to call immediately if she has any concerning symptoms in the interval. The patient voices understanding of current disease status and treatment options and is in agreement with the current care plan.  All questions were answered. The patient knows to call the clinic with any problems, questions or concerns. We can certainly see the patient much sooner if necessary.  he total time spent in the appointment was 30 minutes.  Disclaimer: This note was dictated with voice recognition software. Similar sounding words can inadvertently be transcribed and may not be corrected upon review.

## 2022-11-02 NOTE — Progress Notes (Unsigned)
ex

## 2022-11-21 ENCOUNTER — Other Ambulatory Visit: Payer: Medicare HMO

## 2022-11-21 NOTE — Progress Notes (Unsigned)
Palliative Medicine [facility]  Telephone:([phone]) [phone] Fax:(336) [phone]   Name: Allison [name] Date: 11/21/2022 MRN: [id number]  DOB: 1951/04/10  Patient Care Team: [name], MD as PCP - General (Internal Medicine) Little Ishikawa, MD as PCP - Cardiology (Cardiology) Antony Blackbird, MD as Consulting Physician (Radiation Oncology) Pickenpack-Cousar, Arty Baumgartner, NP as Nurse Practitioner (Nurse Practitioner) Syliva Overman, RN as Oncology Nurse Navigator (Oncology) Si Gaul, MD as Consulting Physician (Oncology) Kermit Balo, DO (Geriatric Medicine)   I connected with Allison [name] on 11/21/22 at 11:00 AM EDT by phone and verified that I am speaking with the correct person using two identifiers.   I discussed the limitations, risks, security and privacy concerns of performing an evaluation and management service by telemedicine and the availability of in-person appointments. I also discussed with the patient that there may be a patient responsible charge related to this service. The patient expressed understanding and agreed to proceed.   Other persons participating in the visit and their role in the encounter: n/s   Patient's location: home  Provider's location: [facility]   Chief Complaint: f/u of symptom management   INTERVAL HISTORY: Allison [name] is a 72 y.o. female with stage IV non-small cell lung cancer (12/2017), hypertension, CVA, CAD, DVT/PE (on Lovenox), and GERD.  Palliative ask to see for symptom management and goals of care.  SOCIAL HISTORY:     reports that she has never smoked. She has never used smokeless tobacco. She reports that she does not currently use alcohol. She reports that she does not use drugs.  ADVANCE DIRECTIVES:  None on file   CODE STATUS: DNR  PAST MEDICAL HISTORY: Past Medical History:  Diagnosis Date   Anemia    Anxiety    Arthritis    Asthma    exercise induced   Coronary artery disease     Depression    PMH   Dyspnea    GERD (gastroesophageal reflux disease)    Glaucoma    History of radiation therapy 01/05/2021   IMRT right lung  11/24/2020-01/05/2021  Dr Antony Blackbird   Hypertension    lung ca dx'd 11/2017   right   Malignant pleural effusion    right   Nuclear sclerotic cataract of right eye 02/28/2021   Dr. Sallye Lat, cataract surgery February 2023   PONV (postoperative nausea and vomiting)    Pre-diabetes    Raynaud's disease    Raynaud's disease    Stroke (HCC) 01/2021   balance off, some express aphasia, weakness    ALLERGIES:  is allergic to hydrocodone-acetaminophen, penicillins, lactose, vicodin [hydrocodone-acetaminophen], lactase-lactobacillus, and other.  MEDICATIONS:  Current Outpatient Medications  Medication Sig Dispense Refill   acetaminophen (TYLENOL) 500 MG tablet Take 1,000 mg by mouth every 6 (six) hours as needed (pain).     ALPRAZolam (XANAX) 0.25 MG tablet Take 1 tablet (0.25 mg total) by mouth 2 (two) times daily as needed for anxiety. May take 2 tablets (0.5 mg total) at bedtime. 60 tablet 0   amLODipine (NORVASC) 5 MG tablet Take 1.5 tablets (7.5 mg total) by mouth daily. (Patient taking differently: Take 5 mg by mouth daily.)     carvedilol (COREG) 12.5 MG tablet Take 12.5 mg by mouth 2 (two) times daily with a meal.     CHELATED MAGNESIUM PO Take 200 mg by mouth daily.     Cholecalciferol (VITAMIN D) 125 MCG (5000 UT) CAPS Take 1 Capful by mouth  daily.     dexamethasone (DECADRON) 4 MG tablet TAKE 1 TABLET TWICE A DAY THE DAY BEFORE, THE DAY OF, AND THE DAY AFTER CHEMOTHERAPY 40 tablet 2   docusate sodium (COLACE) 100 MG capsule Take 1 capsule (100 mg total) by mouth 2 (two) times daily. 60 capsule 5   dorzolamide-timolol (COSOPT) 22.3-6.8 MG/ML ophthalmic solution Place 1 drop into both eyes 2 (two) times daily.     enoxaparin (LOVENOX) 80 MG/0.8ML injection Inject 80 mg into the skin daily.     ezetimibe (ZETIA) 10 MG tablet  Take 10 mg by mouth daily.     FeFum-FePoly-FA-B Cmp-C-Biot (FOLIVANE-PLUS) CAPS TAKE 1 CAPSULE BY MOUTH EVERY DAY IN THE MORNING 90 capsule 0   FLUoxetine (PROZAC) 20 MG capsule Take 1 capsule (20 mg total) by mouth daily. 90 capsule 3   folic acid (FOLVITE) 1 MG tablet TAKE 1 TABLET BY MOUTH EVERY DAY 90 tablet 1   hyoscyamine (LEVSIN SL) 0.125 MG SL tablet PLACE 1-2 TABLETS UNDER THE TONGUE EVERY 6 HOURS AS NEEDED FOR CRAMPING (Patient not taking: Reported on 11/02/2022) 30 tablet 0   ipratropium-albuterol (DUONEB) 0.5-2.5 (3) MG/3ML SOLN Take 3 mLs by nebulization every 6 (six) hours as needed (Asthma). (Patient not taking: Reported on 11/02/2022)     isosorbide mononitrate (IMDUR) 30 MG 24 hr tablet TAKE 1 TABLET BY MOUTH EVERY DAY 90 tablet 3   ketorolac (ACULAR) 0.5 % ophthalmic solution Place 1 drop into the left eye 4 (four) times daily.     latanoprost (XALATAN) 0.005 % ophthalmic solution Place 1 drop into both eyes at bedtime.      ofloxacin (OCUFLOX) 0.3 % ophthalmic solution Place 1 drop into the left eye 4 (four) times daily.     ondansetron (ZOFRAN-ODT) 4 MG disintegrating tablet Take 1 tablet (4 mg total) by mouth every 8 (eight) hours as needed for nausea or vomiting. (Patient not taking: Reported on 11/02/2022) 30 tablet 1   osimertinib mesylate (TAGRISSO) 80 MG tablet Take 1 tablet (80 mg total) by mouth daily. 30 tablet 4   Phenylephrine-DM-GG-APAP (MUCINEX FAST-MAX COLD/FLU MS PO) Take 1 Dose by mouth as needed (cough).     prednisoLONE acetate (PRED FORTE) 1 % ophthalmic suspension Place 1 drop into the left eye 4 (four) times daily.     prochlorperazine (COMPAZINE) 10 MG tablet Take 1 tablet (10 mg total) by mouth every 6 (six) hours as needed. (Patient not taking: Reported on 11/02/2022) 30 tablet 2   promethazine (PHENERGAN) 12.5 MG tablet Take 1 tablet (12.5 mg total) by mouth every 6 (six) hours as needed for nausea or vomiting. (Patient not taking: Reported on 11/02/2022) 30  tablet 2   REPATHA SURECLICK 140 MG/ML SOAJ INJECT 140 MG INTO THE SKIN EVERY 14 (FOURTEEN) DAYS. 6 mL 3   No current facility-administered medications for this visit.    VITAL SIGNS: LMP  (LMP Unknown)  There were no vitals filed for this visit.   Estimated body mass index is 21.95 kg/m as calculated from the following:   Height as of 07/12/22: 5\' 4"  (1.626 m).   Weight as of 11/02/22: 127 lb 14.4 oz (58 kg).   PERFORMANCE STATUS (ECOG) : 1 - Symptomatic but completely ambulatory   IMPRESSION: I connected by phone with Allison [name]. No acute distress noted. She is doing well overall. Denies nausea, vomiting, constipation, or diarrhea. Is remaining as active as possible.   Ongoing anxiety however this is well controlled and she denies  interference in daily living.   She is managing her constipation with daily stool softener of colace. Insurance is no longer paying and she is now having to pay for it over the counter.   No symptom management needs at this time. We will continue to support and follow as needed.   PLAN: Symptoms are well controlled. Appetite is much improved. She is being followed by the dietician.  Nexium twice daily for indigestion. Tolerating well.  Anxiety well controlled. Occasional use of Xanax at bedtime.   Zofran as needed for nausea. Hyoscyamine for cramping.  I will plan to see back in 6-8 weeks in collaboration with her other oncology appointments.    Patient expressed understanding and was in agreement with this plan. She also understands that She can call the clinic at any time with any questions, concerns, or complaints.     Any controlled substances utilized were prescribed in the context of palliative care. PDMP has been reviewed.    Visit consisted of counseling and education dealing with the complex and emotionally intense issues of symptom management and palliative care in the setting of serious and potentially life-threatening illness.Greater  than 50%  of this time was spent counseling and coordinating care related to the above assessment and plan.  Willette Alma, AGPCNP-BC  Palliative Medicine Team/[facility]  *Please note that this is a verbal dictation therefore any spelling or grammatical errors are due to the "[employer] One" system interpretation.

## 2022-11-23 ENCOUNTER — Inpatient Hospital Stay: Payer: Medicare HMO | Attending: Internal Medicine | Admitting: Nurse Practitioner

## 2022-11-23 ENCOUNTER — Encounter: Payer: Self-pay | Admitting: Nurse Practitioner

## 2022-11-23 ENCOUNTER — Other Ambulatory Visit (HOSPITAL_COMMUNITY): Payer: Self-pay

## 2022-11-23 DIAGNOSIS — F419 Anxiety disorder, unspecified: Secondary | ICD-10-CM

## 2022-11-23 DIAGNOSIS — E876 Hypokalemia: Secondary | ICD-10-CM

## 2022-11-23 DIAGNOSIS — Z8673 Personal history of transient ischemic attack (TIA), and cerebral infarction without residual deficits: Secondary | ICD-10-CM | POA: Insufficient documentation

## 2022-11-23 DIAGNOSIS — C3491 Malignant neoplasm of unspecified part of right bronchus or lung: Secondary | ICD-10-CM | POA: Diagnosis not present

## 2022-11-23 DIAGNOSIS — C7931 Secondary malignant neoplasm of brain: Secondary | ICD-10-CM | POA: Insufficient documentation

## 2022-11-23 DIAGNOSIS — Z515 Encounter for palliative care: Secondary | ICD-10-CM | POA: Diagnosis not present

## 2022-11-23 DIAGNOSIS — Z79899 Other long term (current) drug therapy: Secondary | ICD-10-CM | POA: Insufficient documentation

## 2022-11-23 DIAGNOSIS — J910 Malignant pleural effusion: Secondary | ICD-10-CM | POA: Insufficient documentation

## 2022-11-23 DIAGNOSIS — C3411 Malignant neoplasm of upper lobe, right bronchus or lung: Secondary | ICD-10-CM | POA: Insufficient documentation

## 2022-11-23 MED ORDER — ALPRAZOLAM 0.25 MG PO TABS
0.2500 mg | ORAL_TABLET | Freq: Two times a day (BID) | ORAL | 0 refills | Status: AC | PRN
Start: 2022-11-23 — End: ?

## 2022-11-23 MED ORDER — HYOSCYAMINE SULFATE 0.125 MG SL SUBL
0.1250 mg | SUBLINGUAL_TABLET | Freq: Four times a day (QID) | SUBLINGUAL | 3 refills | Status: DC | PRN
Start: 2022-11-23 — End: 2023-05-02
  Filled 2022-11-24: qty 30, 4d supply, fill #0

## 2022-11-24 ENCOUNTER — Encounter: Payer: Self-pay | Admitting: Physician Assistant

## 2022-11-24 ENCOUNTER — Encounter: Payer: Self-pay | Admitting: Internal Medicine

## 2022-11-24 ENCOUNTER — Other Ambulatory Visit (HOSPITAL_COMMUNITY): Payer: Self-pay

## 2022-11-24 MED ORDER — EZETIMIBE 10 MG PO TABS
10.0000 mg | ORAL_TABLET | Freq: Every day | ORAL | 3 refills | Status: DC
  Filled 2022-11-24: qty 90, 90d supply, fill #0

## 2022-11-24 MED ORDER — KETOROLAC TROMETHAMINE 0.5 % OP SOLN
1.0000 [drp] | Freq: Four times a day (QID) | OPHTHALMIC | 1 refills | Status: DC
  Filled 2022-11-24: qty 5, 25d supply, fill #0

## 2022-11-24 MED ORDER — PREDNISOLONE ACETATE 1 % OP SUSP
1.0000 [drp] | Freq: Four times a day (QID) | OPHTHALMIC | 1 refills | Status: DC
  Filled 2022-11-24: qty 10, 50d supply, fill #0

## 2022-11-24 MED ORDER — OFLOXACIN 0.3 % OP SOLN
1.0000 [drp] | Freq: Four times a day (QID) | OPHTHALMIC | 1 refills | Status: DC
  Filled 2022-11-24: qty 5, 25d supply, fill #0

## 2022-11-24 MED ORDER — AMLODIPINE BESYLATE 5 MG PO TABS
5.0000 mg | ORAL_TABLET | Freq: Every day | ORAL | 3 refills | Status: DC
  Filled 2022-11-24: qty 90, 90d supply, fill #0

## 2022-11-24 MED ORDER — CARVEDILOL 25 MG PO TABS: 25.0000 mg | ORAL_TABLET | Freq: Two times a day (BID) | ORAL | 3 refills | Status: DC

## 2022-11-24 MED FILL — Folic Acid Tab 1 MG: ORAL | 90 days supply | Qty: 90 | Fill #0 | Status: CN

## 2022-11-24 MED FILL — Evolocumab Subcutaneous Soln Auto-Injector 140 MG/ML: SUBCUTANEOUS | 84 days supply | Qty: 6 | Fill #0 | Status: CN

## 2022-11-24 MED FILL — Isosorbide Mononitrate Tab ER 24HR 30 MG: ORAL | 90 days supply | Qty: 90 | Fill #0 | Status: CN

## 2022-11-27 ENCOUNTER — Other Ambulatory Visit (HOSPITAL_COMMUNITY): Payer: Self-pay

## 2022-12-01 ENCOUNTER — Other Ambulatory Visit (HOSPITAL_COMMUNITY): Payer: Self-pay

## 2022-12-05 ENCOUNTER — Other Ambulatory Visit (HOSPITAL_COMMUNITY): Payer: Self-pay

## 2022-12-05 ENCOUNTER — Telehealth: Payer: Self-pay | Admitting: Cardiology

## 2022-12-05 ENCOUNTER — Encounter: Payer: Self-pay | Admitting: Pharmacist

## 2022-12-05 NOTE — Telephone Encounter (Signed)
Pt c/o medication issue:  1. Name of Medication:   REPATHA SURECLICK 140 MG/ML SOAJ    2. How are you currently taking this medication (dosage and times per day)?    3. Are you having a reaction (difficulty breathing--STAT)? no  4. What is your medication issue? Patient states that she needs to get recerrtification for the medication. She states so she can get discount rate. Please advise

## 2022-12-05 NOTE — Telephone Encounter (Signed)
Per test claim no PA needed. Expires 06/19/23

## 2022-12-05 NOTE — Telephone Encounter (Signed)
I have re-enrolled pt in [employer] and sent info to her via mychart.

## 2022-12-07 ENCOUNTER — Other Ambulatory Visit (HOSPITAL_COMMUNITY): Payer: Self-pay

## 2022-12-07 ENCOUNTER — Encounter: Payer: Self-pay | Admitting: Physician Assistant

## 2022-12-07 ENCOUNTER — Encounter: Payer: Self-pay | Admitting: Internal Medicine

## 2022-12-07 MED FILL — Evolocumab Subcutaneous Soln Auto-Injector 140 MG/ML: SUBCUTANEOUS | 84 days supply | Qty: 6 | Fill #0 | Status: AC

## 2022-12-07 NOTE — Progress Notes (Signed)
Cardiology Clinic Note   Patient Name: Allison [name] Date of Encounter: 12/12/2022  Primary Care Provider:  [name], MD Primary Cardiologist:  Little Ishikawa, MD  Patient Profile    72 year old female with history of hypertension, status post DES to RCA 06/16/19, stage IV lung cancer, hypertension, hyperlipidemia, DVT/PE, CVA Recent pre-operative clearance on 10/17/2022 for eye surgery.    Past Medical History    Past Medical History:  Diagnosis Date   Anemia    Anxiety    Arthritis    Asthma    exercise induced   Coronary artery disease    Depression    PMH   Dyspnea    GERD (gastroesophageal reflux disease)    Glaucoma    History of radiation therapy 01/05/2021   IMRT right lung  11/24/2020-01/05/2021  Dr Antony Blackbird   Hypertension    lung ca dx'd 11/2017   right   Malignant pleural effusion    right   Nuclear sclerotic cataract of right eye 02/28/2021   Dr. Sallye Lat, cataract surgery February 2023   PONV (postoperative nausea and vomiting)    Pre-diabetes    Raynaud's disease    Raynaud's disease    Stroke (HCC) 01/2021   balance off, some express aphasia, weakness   Past Surgical History:  Procedure Laterality Date   ABDOMINAL HYSTERECTOMY     partial   BRONCHIAL BIOPSY  04/21/2021   Procedure: BRONCHIAL BIOPSIES;  Surgeon: Josephine Igo, DO;  Location: [facility];  Service: Pulmonary;;   BRONCHIAL BRUSHINGS  04/21/2021   Procedure: BRONCHIAL BRUSHINGS;  Surgeon: Josephine Igo, DO;  Location: [facility];  Service: Pulmonary;;   BRONCHIAL NEEDLE ASPIRATION BIOPSY  04/21/2021   Procedure: BRONCHIAL NEEDLE ASPIRATION BIOPSIES;  Surgeon: Josephine Igo, DO;  Location: [facility];  Service: Pulmonary;;   CHEST TUBE INSERTION Right 01/01/2018   Procedure: INSERTION PLEURAL DRAINAGE CATHETER;  Surgeon: Kerin Perna, MD;  Location: [facility] OR;  Service: Thoracic;  Laterality: Right;   CHEST TUBE INSERTION  04/21/2021   Procedure:  CHEST TUBE INSERTION;  Surgeon: Josephine Igo, DO;  Location: [facility];  Service: Pulmonary;;   COLONOSCOPY     CORONARY STENT INTERVENTION N/A 06/16/2019   Procedure: CORONARY STENT INTERVENTION;  Surgeon: Corky Crafts, MD;  Location: [facility];  Service: Cardiovascular;  Laterality: N/A;   DILATION AND CURETTAGE OF UTERUS     EYE SURGERY     due to Glaucoma   IR IMAGING GUIDED PORT INSERTION  03/22/2021   IR PORT REPAIR CENTRAL VENOUS ACCESS DEVICE  04/15/2021   LEFT HEART CATH AND CORONARY ANGIOGRAPHY N/A 06/16/2019   Procedure: LEFT HEART CATH AND CORONARY ANGIOGRAPHY;  Surgeon: Corky Crafts, MD;  Location: [facility] INVASIVE CV LAB;  Service: Cardiovascular;  Laterality: N/A;   REMOVAL OF PLEURAL DRAINAGE CATHETER Right 11/07/2018   Procedure: REMOVAL OF PLEURAL DRAINAGE CATHETER;  Surgeon: Kerin Perna, MD;  Location: [facility] OR;  Service: Thoracic;  Laterality: Right;   ROTATOR CUFF REPAIR     TUBAL LIGATION     VIDEO BRONCHOSCOPY WITH ENDOBRONCHIAL NAVIGATION Bilateral 04/21/2021   Procedure: VIDEO BRONCHOSCOPY WITH ENDOBRONCHIAL NAVIGATION;  Surgeon: Josephine Igo, DO;  Location: [facility];  Service: Pulmonary;  Laterality: Bilateral;  ION   WISDOM TOOTH EXTRACTION      Allergies  Allergies  Allergen Reactions   Hydrocodone-Acetaminophen Other (See Comments)   Penicillins Other (See Comments)    SYNCOPE PATIENT HAS  HAD A PCN REACTION WITH IMMEDIATE RASH, FACIAL/TONGUE/THROAT SWELLING, SOB, OR LIGHTHEADEDNESS WITH HYPOTENSION:  #  #  YES  #  # Has patient had a PCN reaction causing severe rash involving mucus membranes or skin necrosis: No Has patient had a PCN reaction that required hospitalization: No Has patient had a PCN reaction occurring within the last 10 years: No  Other reaction(s): other   Lactose Other (See Comments)    Allergy to all dairy products Abdominal cramping   Vicodin [Hydrocodone-Acetaminophen] Other (See Comments)    Sped  up heart and breathing   Lactase-Lactobacillus Nausea And Vomiting   Other Other (See Comments)    Glaucoma eye drop - turned eyes dark    History of Present Illness    Mrs. Greenly presents today for ongoing assessment and management for hypertension, status post DES to RCA 06/16/19, hyperlipidemia, DVT/PE, CVA.  She comes today with symptoms of dizziness and lightheadedness later in the afternoon.  She is on a 3-week break from chemotherapy for stage IV lung cancer.  She has a long list of medications with multiple duplicates for which she would like [place] to review.  She states she has a garden that she works in and by the end of the day or late afternoon she is having episodes of lightheadedness and dizziness which are disconcerting to her.  She denies associated palpitations, shortness of breath, near syncope or syncopal episodes associated with this.  She is eating better off of chemo and has gained some weight, not skipping meals, and medically compliant.  Home Medications    Current Outpatient Medications  Medication Sig Dispense Refill   acetaminophen (TYLENOL) 500 MG tablet Take 1,000 mg by mouth every 6 (six) hours as needed (pain).     ALPRAZolam (XANAX) 0.25 MG tablet Take 1 tablet (0.25 mg total) by mouth 2 (two) times daily as needed for anxiety. May take 2 tablets (0.5 mg total) at bedtime. 60 tablet 0   amLODipine (NORVASC) 5 MG tablet Take 1 tablet (5 mg total) by mouth daily. 90 tablet 3   carvedilol (COREG) 12.5 MG tablet Take 12.5 mg by mouth 2 (two) times daily with a meal.     CHELATED MAGNESIUM PO Take 200 mg by mouth daily.     Cholecalciferol (VITAMIN D) 125 MCG (5000 UT) CAPS Take 1 Capful by mouth daily.     dexamethasone (DECADRON) 4 MG tablet TAKE 1 TABLET TWICE A DAY THE DAY BEFORE, THE DAY OF, AND THE DAY AFTER CHEMOTHERAPY 40 tablet 2   docusate sodium (COLACE) 100 MG capsule Take 1 capsule (100 mg total) by mouth 2 (two) times daily. 60 capsule 5    dorzolamide-timolol (COSOPT) 22.3-6.8 MG/ML ophthalmic solution Place 1 drop into both eyes 2 (two) times daily.     enoxaparin (LOVENOX) 80 MG/0.8ML injection Inject 80 mg into the skin daily.     ezetimibe (ZETIA) 10 MG tablet Take 10 mg by mouth daily.     FeFum-FePoly-FA-B Cmp-C-Biot (FOLIVANE-PLUS) CAPS TAKE 1 CAPSULE BY MOUTH EVERY DAY IN THE MORNING 90 capsule 0   FLUoxetine (PROZAC) 20 MG capsule Take 1 capsule (20 mg total) by mouth daily. 90 capsule 3   folic acid (FOLVITE) 1 MG tablet TAKE 1 TABLET BY MOUTH EVERY DAY 90 tablet 1   hyoscyamine (LEVSIN SL) 0.125 MG SL tablet Place 1-2 tablets (0.125-0.25 mg total) under the tongue every 6 (six) hours as needed for cramping. 30 tablet 3   ipratropium-albuterol (DUONEB) 0.5-2.5 (  3) MG/3ML SOLN Take 3 mLs by nebulization every 6 (six) hours as needed (Asthma).     isosorbide mononitrate (IMDUR) 30 MG 24 hr tablet Take 0.5 tablets (15 mg total) by mouth daily. 90 tablet 3   latanoprost (XALATAN) 0.005 % ophthalmic solution Place 1 drop into both eyes at bedtime.      ondansetron (ZOFRAN-ODT) 4 MG disintegrating tablet Take 1 tablet (4 mg total) by mouth every 8 (eight) hours as needed for nausea or vomiting. 30 tablet 1   osimertinib mesylate (TAGRISSO) 80 MG tablet Take 1 tablet (80 mg total) by mouth daily. 30 tablet 4   Phenylephrine-DM-GG-APAP (MUCINEX FAST-MAX COLD/FLU MS PO) Take 1 Dose by mouth as needed (cough).     prednisoLONE acetate (PRED FORTE) 1 % ophthalmic suspension Place 1 drop into the left eye 4 (four) times daily.     prochlorperazine (COMPAZINE) 10 MG tablet Take 1 tablet (10 mg total) by mouth every 6 (six) hours as needed. 30 tablet 2   promethazine (PHENERGAN) 12.5 MG tablet Take 1 tablet (12.5 mg total) by mouth every 6 (six) hours as needed for nausea or vomiting. 30 tablet 2   REPATHA SURECLICK 140 MG/ML SOAJ INJECT 140 MG INTO THE SKIN EVERY 14 (FOURTEEN) DAYS. 6 mL 3   ketorolac (ACULAR) 0.5 % ophthalmic solution  Place 1 drop into the left eye 4 (four) times daily starting one day before surgery. (Patient not taking: Reported on 12/12/2022) 5 mL 1   ofloxacin (OCUFLOX) 0.3 % ophthalmic solution Place 1 drop into the left eye 4 (four) times daily. (Patient not taking: Reported on 12/12/2022)     ofloxacin (OCUFLOX) 0.3 % ophthalmic solution Place 1 drop into the left eye 4 (four) times daily starting one day before surgery. (Patient not taking: Reported on 12/12/2022) 5 mL 1   prednisoLONE acetate (PRED FORTE) 1 % ophthalmic suspension Place 1 drop into the left eye 4 (four) times daily after surgery. (Patient not taking: Reported on 12/12/2022) 10 mL 1   No current facility-administered medications for this visit.     Family History    Family History  Problem Relation Age of Onset   Heart disease Sister    Heart disease Brother    Lung cancer Other    She indicated that her mother is deceased. She indicated that her father is deceased. She indicated that her sister is alive. She indicated that her brother is deceased. She indicated that the status of her other is unknown.  Social History    Social History   Socioeconomic History   Marital status: Widowed    Spouse name: Not on file   Number of children: 3   Years of education: Not on file   Highest education level: Not on file  Occupational History    Employer: [employer]  Tobacco Use   Smoking status: Never   Smokeless tobacco: Never  Vaping Use   Vaping Use: Never used  Substance and Sexual Activity   Alcohol use: Not Currently    Comment: up to 3 drinks per week   Drug use: No    Comment: CBD oil    Sexual activity: Not Currently    Comment: Hysterectomy  Other Topics Concern   Not on file  Social History Narrative   Not on file   Social Determinants of Health   Financial Resource Strain: Low Risk  (07/22/2019)   Overall Financial Resource Strain (CARDIA)    Difficulty of Paying Living  Expenses: Not hard at all  Food Insecurity:  No Food Insecurity (07/22/2019)   Hunger Vital Sign    Worried About Running Out of Food in the Last Year: Never true    Ran Out of Food in the Last Year: Never true  Transportation Needs: No Transportation Needs (07/22/2019)   PRAPARE - Administrator, Civil Service (Medical): No    Lack of Transportation (Non-Medical): No  Physical Activity: Sufficiently Active (07/22/2019)   Exercise Vital Sign    Days of Exercise per Week: 5 days    Minutes of Exercise per Session: 30 min  Stress: Not on file  Social Connections: Unknown (07/22/2019)   Social Connection and Isolation Panel [NHANES]    Frequency of Communication with Friends and Family: Three times a week    Frequency of Social Gatherings with Friends and Family: Once a week    Attends Religious Services: Not on Marketing executive or Organizations: Yes    Attends Engineer, structural: More than 4 times per year    Marital Status: Living with partner  Intimate Partner Violence: Not on file     Review of Systems    General:  No chills, fever, night sweats or weight changes.  Cardiovascular:  No chest pain, dyspnea on exertion, edema, orthopnea, palpitations, paroxysmal nocturnal dyspnea. Dermatological: No rash, lesions/masses Respiratory: No cough, dyspnea Urologic: No hematuria, dysuria Abdominal:   No nausea, vomiting, diarrhea, bright red blood per rectum, melena, or hematemesis Neurologic:  No visual changes, wkns, changes in mental status.  Positive for complaints of dizziness and lightheadedness in afternoon. All other systems reviewed and are otherwise negative except as noted above.     Physical Exam    VS:  BP (!) 128/58 (BP Location: Left Arm, Patient Position: Sitting, Cuff Size: Normal)   Pulse 79   Ht 5\' 4"  (1.626 m)   Wt 133 lb (60.3 kg)   LMP  (LMP Unknown)   SpO2 97%   BMI 22.83 kg/m  , BMI Body mass index is 22.83 kg/m.     GEN: Well nourished, well developed, in no acute  distress. HEENT: normal.  Wearing glasses Neck: Supple, no JVD, carotid bruits, or masses. Cardiac: RRR, 2/6 systolic murmur, murmurs, no rubs, or gallops. No clubbing, cyanosis, edema.  Radials/DP/PT 2+ and equal bilaterally.  Respiratory:  Respirations regular and unlabored, clear to auscultation bilaterally. GI: Soft, nontender, nondistended, BS + x 4. MS: no deformity or atrophy.  Right chest Port-A-Cath noted. Skin: warm and dry, no rash. Neuro:  Strength and sensation are intact. Psych: Normal affect.  Accessory Clinical Findings     EKG (personally reviewed) normal sinus rhythm ventricular rate 79 bpm. Lab Results  Component Value Date   WBC 2.8 (L) 10/31/2022   HGB 11.1 (L) 10/31/2022   HCT 34.0 (L) 10/31/2022   MCV 86.3 10/31/2022   PLT 134 (L) 10/31/2022   Lab Results  Component Value Date   CREATININE 1.26 (H) 10/31/2022   BUN 25 (H) 10/31/2022   NA 141 10/31/2022   K 3.9 10/31/2022   CL 108 10/31/2022   CO2 28 10/31/2022   Lab Results  Component Value Date   ALT 37 10/31/2022   AST 34 10/31/2022   ALKPHOS 69 10/31/2022   BILITOT 0.3 10/31/2022   Lab Results  Component Value Date   CHOL 175 02/12/2021   HDL 62 02/12/2021   LDLCALC 91 02/12/2021   TRIG 110 02/12/2021  CHOLHDL 2.8 02/12/2021    Lab Results  Component Value Date   HGBA1C 5.7 (H) 02/12/2021    Review of Prior Studies:   NM Stress Test 03/29/2022   The study is normal. The study is low risk.   No ST deviation was noted.   Left ventricular function is normal. Nuclear stress EF: 68 %. The left ventricular ejection fraction is hyperdynamic (>65%). End diastolic cavity size is normal.   Prior study available for comparison from 03/19/2020.   Normal stress nuclear study with no ischemia or infarction; gated EF 68 with normal wall motion.   Echocardiogram 02/13/2021   1. Left ventricular ejection fraction, by estimation, is 60 to 65%. The  left ventricle has normal function. The left  ventricle has no regional  wall motion abnormalities.   2. The mitral valve is normal in structure. Mild to moderate mitral valve  regurgitation.   Assessment & Plan   1.  Coronary artery disease: History of DES to the RCA in 2020.  She denies any recurrent discomfort associated with angina pain.  For her this was right jaw pain and right neck pain.  The symptoms have not reoccurred.  I think some of her lightheadedness and dizziness may be related to isosorbide mononitrate dose.  With weight loss she may not need as much as she is taking at this time.  I will decrease the isosorbide to 50 mg daily from 30 mg daily to allow her to have better blood pressure in the afternoon when she is also on 2 other medications affecting blood pressure.  Will see her back to recheck her symptoms in few months.  I did inform her that her blood pressure may rise a few more points after decrease of isosorbide.  She is to call us if it rises significantly.  2.  Hypertension: Other medications that were duplicates have been removed is on amlodipine 5 mg daily and carvedilol 12.5 mg twice daily. The patient Hypertension: And were listed as higher doses.  This has been verified by going through her medication bottles which she brings with her.  She is advised to go back home and look at the other medication bottles and remove them from her medication storage to avoid confusing them by name and not looking specifically at doses.  She verbalizes understanding.      3.  Hypercholesterolemia: Patient remains on Zetia 10 mg daily and Repatha every 2-week injection.  She has multiple lab draws completed through various physicians.  Goal of LDL is less than 70.  If not completed by follow-up visit we will need to check fasting lipids and LFTs.    Bettey Mare. Liborio Nixon, ANP, AACC   12/12/2022 4:33 PM      Office [phone] Fax [phone]  Notice: This dictation was prepared with Dragon dictation along with smaller  phrase technology. Any transcriptional errors that result from this process are unintentional and may not be corrected upon review.

## 2022-12-12 ENCOUNTER — Encounter: Payer: Self-pay | Admitting: Adult Health

## 2022-12-12 ENCOUNTER — Ambulatory Visit: Payer: Medicare HMO | Attending: Adult Health | Admitting: Adult Health

## 2022-12-12 VITALS — BP 128/58 | HR 79 | Ht 64.0 in | Wt 133.0 lb

## 2022-12-12 DIAGNOSIS — I10 Essential (primary) hypertension: Secondary | ICD-10-CM

## 2022-12-12 DIAGNOSIS — E7800 Pure hypercholesterolemia, unspecified: Secondary | ICD-10-CM | POA: Diagnosis not present

## 2022-12-12 DIAGNOSIS — I2510 Atherosclerotic heart disease of native coronary artery without angina pectoris: Secondary | ICD-10-CM | POA: Diagnosis not present

## 2022-12-12 DIAGNOSIS — R42 Dizziness and giddiness: Secondary | ICD-10-CM | POA: Diagnosis not present

## 2022-12-12 MED ORDER — ISOSORBIDE MONONITRATE ER 30 MG PO TB24: 15.0000 mg | ORAL_TABLET | Freq: Every day | ORAL | 3 refills | Status: DC

## 2022-12-12 NOTE — Patient Instructions (Signed)
Medication Instructions:  Decrease Imdur from 30 mg to 15 mg ( Take 1/2 of 30 mg Tablet Daily). *If you need a refill on your cardiac medications before your next appointment, please call your pharmacy*   Lab Work: No Labs If you have labs (blood work) drawn today and your tests are completely normal, you will receive your results only by: MyChart Message (if you have MyChart) OR A paper copy in the mail If you have any lab test that is abnormal or we need to change your treatment, we will call you to review the results.   Testing/Procedures: No Testing   Follow-Up: At [facility], you and your health needs are our priority.  As part of our continuing mission to provide you with exceptional heart care, we have created designated Provider Care Teams.  These Care Teams include your primary Cardiologist (physician) and Advanced Practice Providers (APPs -  Physician Assistants and Nurse Practitioners) who all work together to provide you with the care you need, when you need it.  We recommend signing up for the patient portal called "MyChart".  Sign up information is provided on this After Visit Summary.  MyChart is used to connect with patients for Virtual Visits (Telemedicine).  Patients are able to view lab/test results, encounter notes, upcoming appointments, etc.  Non-urgent messages can be sent to your provider as well.   To learn more about what you can do with MyChart, go to [email or web address].    Your next appointment:   3 month(s)  Provider:   Joni Reining, DNP, ANP    or, Little Ishikawa, MD

## 2022-12-13 ENCOUNTER — Telehealth: Payer: Self-pay | Admitting: Internal Medicine

## 2022-12-14 ENCOUNTER — Inpatient Hospital Stay: Payer: Medicare HMO | Admitting: Internal Medicine

## 2022-12-14 ENCOUNTER — Inpatient Hospital Stay: Payer: Medicare HMO

## 2022-12-14 ENCOUNTER — Other Ambulatory Visit: Payer: Medicare HMO

## 2022-12-14 ENCOUNTER — Inpatient Hospital Stay (HOSPITAL_BASED_OUTPATIENT_CLINIC_OR_DEPARTMENT_OTHER): Payer: Medicare HMO

## 2022-12-14 VITALS — BP 134/69 | HR 69 | Temp 98.0°F | Resp 17 | Ht 64.0 in | Wt 131.5 lb

## 2022-12-14 DIAGNOSIS — C3411 Malignant neoplasm of upper lobe, right bronchus or lung: Secondary | ICD-10-CM | POA: Diagnosis present

## 2022-12-14 DIAGNOSIS — Z79899 Other long term (current) drug therapy: Secondary | ICD-10-CM | POA: Diagnosis not present

## 2022-12-14 DIAGNOSIS — Z95828 Presence of other vascular implants and grafts: Secondary | ICD-10-CM

## 2022-12-14 DIAGNOSIS — C3490 Malignant neoplasm of unspecified part of unspecified bronchus or lung: Secondary | ICD-10-CM | POA: Diagnosis not present

## 2022-12-14 DIAGNOSIS — J910 Malignant pleural effusion: Secondary | ICD-10-CM | POA: Diagnosis not present

## 2022-12-14 DIAGNOSIS — C3491 Malignant neoplasm of unspecified part of right bronchus or lung: Secondary | ICD-10-CM

## 2022-12-14 DIAGNOSIS — Z8673 Personal history of transient ischemic attack (TIA), and cerebral infarction without residual deficits: Secondary | ICD-10-CM | POA: Diagnosis not present

## 2022-12-14 DIAGNOSIS — C7931 Secondary malignant neoplasm of brain: Secondary | ICD-10-CM | POA: Diagnosis present

## 2022-12-14 LAB — CBC WITH DIFFERENTIAL (CANCER CENTER ONLY)
Abs Immature Granulocytes: 0 10*3/uL (ref 0.00–0.07)
Basophils Absolute: 0 10*3/uL (ref 0.0–0.1)
Basophils Relative: 1 %
Eosinophils Absolute: 0.1 10*3/uL (ref 0.0–0.5)
Eosinophils Relative: 3 %
HCT: 35.1 % — ABNORMAL LOW (ref 36.0–46.0)
Hemoglobin: 11.1 g/dL — ABNORMAL LOW (ref 12.0–15.0)
Immature Granulocytes: 0 %
Lymphocytes Relative: 32 %
Lymphs Abs: 0.9 10*3/uL (ref 0.7–4.0)
MCH: 27.2 pg (ref 26.0–34.0)
MCHC: 31.6 g/dL (ref 30.0–36.0)
MCV: 86 fL (ref 80.0–100.0)
Monocytes Absolute: 0.4 10*3/uL (ref 0.1–1.0)
Monocytes Relative: 12 %
Neutro Abs: 1.5 10*3/uL — ABNORMAL LOW (ref 1.7–7.7)
Neutrophils Relative %: 52 %
Platelet Count: 135 10*3/uL — ABNORMAL LOW (ref 150–400)
RBC: 4.08 MIL/uL (ref 3.87–5.11)
RDW: 13.5 % (ref 11.5–15.5)
WBC Count: 2.9 10*3/uL — ABNORMAL LOW (ref 4.0–10.5)
nRBC: 0 % (ref 0.0–0.2)

## 2022-12-14 LAB — CMP (CANCER CENTER ONLY)
ALT: 35 U/L (ref 0–44)
AST: 30 U/L (ref 15–41)
Albumin: 3.5 g/dL (ref 3.5–5.0)
Alkaline Phosphatase: 74 U/L (ref 38–126)
Anion gap: 5 (ref 5–15)
BUN: 18 mg/dL (ref 8–23)
CO2: 27 mmol/L (ref 22–32)
Calcium: 9.2 mg/dL (ref 8.9–10.3)
Chloride: 106 mmol/L (ref 98–111)
Creatinine: 1.16 mg/dL — ABNORMAL HIGH (ref 0.44–1.00)
GFR, Estimated: 50 mL/min — ABNORMAL LOW (ref >60)
Glucose, Bld: 84 mg/dL (ref 70–99)
Potassium: 4.3 mmol/L (ref 3.5–5.1)
Sodium: 138 mmol/L (ref 135–145)
Total Bilirubin: 0.3 mg/dL (ref 0.3–1.2)
Total Protein: 6.8 g/dL (ref 6.5–8.1)

## 2022-12-14 MED ORDER — HEPARIN SOD (PORK) LOCK FLUSH 100 UNIT/ML IV SOLN
500.0000 [IU] | Freq: Once | INTRAVENOUS | Status: AC
  Administered 2022-12-14: 500 [IU] via INTRAVENOUS

## 2022-12-14 MED ORDER — SODIUM CHLORIDE 0.9% FLUSH
10.0000 mL | Freq: Once | INTRAVENOUS | Status: AC
  Administered 2022-12-14: 10 mL

## 2022-12-14 NOTE — Progress Notes (Signed)
[facility] Telephone:(336) [phone]   Fax:([phone]  OFFICE PROGRESS NOTE  Allison Devon, MD [address] [place] [address]  DIAGNOSIS: Stage IV (T2 a,N2, M1a) non-small cell lung cancer, adenocarcinoma diagnosed in July 2019 and presented with right upper lobe lung mass in addition to mediastinal lymphadenopathy as well as bilateral pulmonary nodules and malignant right pleural effusion.   Biomarker Findings Microsatellite status - Cannot Be Determined Tumor Mutational Burden - Cannot Be Determined Genomic Findings For a complete list of the genes assayed, please refer to the Appendix. EGFR exon 19 deletion ([id number]) TP53 Y220C 7 Disease relevant genes with no reportable alterations: KRAS, ALK, BRAF, MET, RET, ERBB2, ROS1    PRIOR THERAPY:  1) Status post right Pleurx catheter placement by Dr. Donata Clay for drainage of malignant right pleural effusion. 2) palliative radiotherapy to the enlarging right upper lobe lung mass and mediastinum under the care of Dr. Roselind Messier expected to be completed on January 05, 2021. 3) Tagrisso 80 mg p.o. daily.  First dose was given on 01/29/2018.  Status post 39 months of treatment.   CURRENT THERAPY: Systemic chemotherapy with carboplatin for AUC of 5 and Alimta 500 Mg/M2 every 3 weeks.  First dose 03/17/2021.  The patient will also continue her current treatment with Tagrisso 80 mg p.o. daily.  She is status post 26 cycles. Starting from cycle #7, she will be on Alimta only 400 mg/m2.  Her treatment with Alimta has been on hold for several months now.  INTERVAL HISTORY: Allison [name] 72 y.o. female returns to the clinic today for follow-up visit accompanied by her boyfriend.  The patient is feeling fine today with no concerning complaints except for the mild fatigue.  She is more active.  She denied having any current chest pain but has mild shortness of breath with exertion with no cough or hemoptysis.   She has no nausea, vomiting, diarrhea or constipation.  The patient has no recent weight loss or night sweats.  She has no headache or visual changes.  She continues to tolerate her treatment with Tagrisso fairly well.  MEDICAL HISTORY: Past Medical History:  Diagnosis Date   Anemia    Anxiety    Arthritis    Asthma    exercise induced   Coronary artery disease    Depression    PMH   Dyspnea    GERD (gastroesophageal reflux disease)    Glaucoma    History of radiation therapy 01/05/2021   IMRT right lung  11/24/2020-01/05/2021  Dr Antony Blackbird   Hypertension    lung ca dx'd 11/2017   right   Malignant pleural effusion    right   Nuclear sclerotic cataract of right eye 02/28/2021   Dr. Sallye Lat, cataract surgery February 2023   PONV (postoperative nausea and vomiting)    Pre-diabetes    Raynaud's disease    Raynaud's disease    Stroke (HCC) 01/2021   balance off, some express aphasia, weakness    ALLERGIES:  is allergic to hydrocodone-acetaminophen, penicillins, lactose, vicodin [hydrocodone-acetaminophen], lactase-lactobacillus, and other.  MEDICATIONS:  Current Outpatient Medications  Medication Sig Dispense Refill   acetaminophen (TYLENOL) 500 MG tablet Take 1,000 mg by mouth every 6 (six) hours as needed (pain).     ALPRAZolam (XANAX) 0.25 MG tablet Take 1 tablet (0.25 mg total) by mouth 2 (two) times daily as needed for anxiety. May take 2 tablets (0.5 mg total) at bedtime. 60 tablet  0   amLODipine (NORVASC) 5 MG tablet Take 1 tablet (5 mg total) by mouth daily. 90 tablet 3   carvedilol (COREG) 12.5 MG tablet Take 12.5 mg by mouth 2 (two) times daily with a meal.     CHELATED MAGNESIUM PO Take 200 mg by mouth daily.     Cholecalciferol (VITAMIN D) 125 MCG (5000 UT) CAPS Take 1 Capful by mouth daily.     dexamethasone (DECADRON) 4 MG tablet TAKE 1 TABLET TWICE A DAY THE DAY BEFORE, THE DAY OF, AND THE DAY AFTER CHEMOTHERAPY 40 tablet 2   docusate sodium (COLACE)  100 MG capsule Take 1 capsule (100 mg total) by mouth 2 (two) times daily. 60 capsule 5   dorzolamide-timolol (COSOPT) 22.3-6.8 MG/ML ophthalmic solution Place 1 drop into both eyes 2 (two) times daily.     enoxaparin (LOVENOX) 80 MG/0.8ML injection Inject 80 mg into the skin daily.     ezetimibe (ZETIA) 10 MG tablet Take 10 mg by mouth daily.     FeFum-FePoly-FA-B Cmp-C-Biot (FOLIVANE-PLUS) CAPS TAKE 1 CAPSULE BY MOUTH EVERY DAY IN THE MORNING 90 capsule 0   FLUoxetine (PROZAC) 20 MG capsule Take 1 capsule (20 mg total) by mouth daily. 90 capsule 3   folic acid (FOLVITE) 1 MG tablet TAKE 1 TABLET BY MOUTH EVERY DAY 90 tablet 1   hyoscyamine (LEVSIN SL) 0.125 MG SL tablet Place 1-2 tablets (0.125-0.25 mg total) under the tongue every 6 (six) hours as needed for cramping. 30 tablet 3   ipratropium-albuterol (DUONEB) 0.5-2.5 (3) MG/3ML SOLN Take 3 mLs by nebulization every 6 (six) hours as needed (Asthma).     isosorbide mononitrate (IMDUR) 30 MG 24 hr tablet Take 0.5 tablets (15 mg total) by mouth daily. 90 tablet 3   ketorolac (ACULAR) 0.5 % ophthalmic solution Place 1 drop into the left eye 4 (four) times daily starting one day before surgery. (Patient not taking: Reported on 12/12/2022) 5 mL 1   latanoprost (XALATAN) 0.005 % ophthalmic solution Place 1 drop into both eyes at bedtime.      ofloxacin (OCUFLOX) 0.3 % ophthalmic solution Place 1 drop into the left eye 4 (four) times daily. (Patient not taking: Reported on 12/12/2022)     ofloxacin (OCUFLOX) 0.3 % ophthalmic solution Place 1 drop into the left eye 4 (four) times daily starting one day before surgery. (Patient not taking: Reported on 12/12/2022) 5 mL 1   ondansetron (ZOFRAN-ODT) 4 MG disintegrating tablet Take 1 tablet (4 mg total) by mouth every 8 (eight) hours as needed for nausea or vomiting. 30 tablet 1   osimertinib mesylate (TAGRISSO) 80 MG tablet Take 1 tablet (80 mg total) by mouth daily. 30 tablet 4   Phenylephrine-DM-GG-APAP  (MUCINEX FAST-MAX COLD/FLU MS PO) Take 1 Dose by mouth as needed (cough).     prednisoLONE acetate (PRED FORTE) 1 % ophthalmic suspension Place 1 drop into the left eye 4 (four) times daily.     prednisoLONE acetate (PRED FORTE) 1 % ophthalmic suspension Place 1 drop into the left eye 4 (four) times daily after surgery. (Patient not taking: Reported on 12/12/2022) 10 mL 1   prochlorperazine (COMPAZINE) 10 MG tablet Take 1 tablet (10 mg total) by mouth every 6 (six) hours as needed. 30 tablet 2   promethazine (PHENERGAN) 12.5 MG tablet Take 1 tablet (12.5 mg total) by mouth every 6 (six) hours as needed for nausea or vomiting. 30 tablet 2   REPATHA SURECLICK 140 MG/ML SOAJ INJECT 140 MG  INTO THE SKIN EVERY 14 (FOURTEEN) DAYS. 6 mL 3   No current facility-administered medications for this visit.    SURGICAL HISTORY:  Past Surgical History:  Procedure Laterality Date   ABDOMINAL HYSTERECTOMY     partial   BRONCHIAL BIOPSY  04/21/2021   Procedure: BRONCHIAL BIOPSIES;  Surgeon: Josephine Igo, DO;  Location: [facility];  Service: Pulmonary;;   BRONCHIAL BRUSHINGS  04/21/2021   Procedure: BRONCHIAL BRUSHINGS;  Surgeon: Josephine Igo, DO;  Location: [facility];  Service: Pulmonary;;   BRONCHIAL NEEDLE ASPIRATION BIOPSY  04/21/2021   Procedure: BRONCHIAL NEEDLE ASPIRATION BIOPSIES;  Surgeon: Josephine Igo, DO;  Location: [facility];  Service: Pulmonary;;   CHEST TUBE INSERTION Right 01/01/2018   Procedure: INSERTION PLEURAL DRAINAGE CATHETER;  Surgeon: Kerin Perna, MD;  Location: [facility] OR;  Service: Thoracic;  Laterality: Right;   CHEST TUBE INSERTION  04/21/2021   Procedure: CHEST TUBE INSERTION;  Surgeon: Josephine Igo, DO;  Location: [facility];  Service: Pulmonary;;   COLONOSCOPY     CORONARY STENT INTERVENTION N/A 06/16/2019   Procedure: CORONARY STENT INTERVENTION;  Surgeon: Corky Crafts, MD;  Location: [facility] INVAS[facility] CV LAB;  Service: Cardiovascular;  Laterality: N/A;    DILATION AND CURETTAGE OF UTERUS     EYE SURGERY     due to Glaucoma   IR IMAGING GUIDED PORT INSERTION  03/22/2021   IR PORT REPAIR CENTRAL VENOUS ACCESS DEVICE  04/15/2021   LEFT HEART CATH AND CORONARY ANGIOGRAPHY N/A 06/16/2019   Procedure: LEFT HEART CATH AND CORONARY ANGIOGRAPHY;  Surgeon: Corky Crafts, MD;  Location: [facility] INVASIVE CV LAB;  Service: Cardiovascular;  Laterality: N/A;   REMOVAL OF PLEURAL DRAINAGE CATHETER Right 11/07/2018   Procedure: REMOVAL OF PLEURAL DRAINAGE CATHETER;  Surgeon: Kerin Perna, MD;  Location: [facility] OR;  Service: Thoracic;  Laterality: Right;   ROTATOR CUFF REPAIR     TUBAL LIGATION     VIDEO BRONCHOSCOPY WITH ENDOBRONCHIAL NAVIGATION Bilateral 04/21/2021   Procedure: VIDEO BRONCHOSCOPY WITH ENDOBRONCHIAL NAVIGATION;  Surgeon: Josephine Igo, DO;  Location: [facility];  Service: Pulmonary;  Laterality: Bilateral;  ION   WISDOM TOOTH EXTRACTION      REVIEW OF SYSTEMS:  A comprehensive review of systems was negative except for: Constitutional: positive for fatigue Respiratory: positive for dyspnea on exertion   PHYSICAL EXAMINATION: General appearance: alert, cooperative, appears stated age, fatigued, and no distress Head: Normocephalic, without obvious abnormality, atraumatic Neck: no adenopathy, no JVD, supple, symmetrical, trachea midline, and thyroid not enlarged, symmetric, no tenderness/mass/nodules Lymph nodes: Cervical, supraclavicular, and axillary nodes normal. Resp: clear to auscultation bilaterally Back: symmetric, no curvature. ROM normal. No CVA tenderness. Cardio: regular rate and rhythm, S1, S2 normal, no murmur, click, rub or gallop GI: soft, non-tender; bowel sounds normal; no masses,  no organomegaly Extremities: extremities normal, atraumatic, no cyanosis or edema  ECOG PERFORMANCE STATUS: 1 - Symptomatic but completely ambulatory  Blood pressure 134/69, pulse 69, temperature 98 F (36.7 C), temperature source Oral,  resp. rate 17, height 5\' 4"  (1.626 m), weight 131 lb 8 oz (59.6 kg), SpO2 100 %.  LABORATORY DATA: Lab Results  Component Value Date   WBC 2.9 (L) 12/14/2022   HGB 11.1 (L) 12/14/2022   HCT 35.1 (L) 12/14/2022   MCV 86.0 12/14/2022   PLT 135 (L) 12/14/2022      Chemistry      Component Value Date/Time   NA 141 10/31/2022 1117   NA 140 03/04/2021 1516  K 3.9 10/31/2022 1117   CL 108 10/31/2022 1117   CO2 28 10/31/2022 1117   BUN 25 (H) 10/31/2022 1117   BUN 21 03/04/2021 1516   CREATININE 1.26 (H) 10/31/2022 1117      Component Value Date/Time   CALCIUM 9.3 10/31/2022 1117   ALKPHOS 69 10/31/2022 1117   AST 34 10/31/2022 1117   ALT 37 10/31/2022 1117   BILITOT 0.3 10/31/2022 1117       RADIOGRAPHIC STUDIES: No results found.  ASSESSMENT AND PLAN: This is a very pleasant 72 years old never smoker African-American female recently with a stage IV non-small cell lung cancer, adenocarcinoma with positive EGFR mutation with deletion in exon 19. The patient was started on treatment with Tagrisso 80 mg p.o. daily status post 35 months of treatment. She has been tolerating this treatment well with no concerning adverse effects except for intermittent diarrhea. She had repeat CT scan of the chest, abdomen pelvis performed recently.  I personally and independently reviewed the scans and discussed the results with the patient and her boyfriend today. Unfortunately the CT scan showed interval progression of the right apical lung mass in addition to progression of mediastinal lymphadenopathy concerning for worsening of her disease. She has no actionable resistant mutation on the molecular studies performed by [facility]. The patient continued her current treatment with Tagrisso and tolerating it fairly well. 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 Roselind Messier completed January 05, 2021. The patient had significant opacities in her lung that was initially thought to be secondary to radiation treatment versus Tagrisso induced pneumonitis versus lymphangitic spread of the tumor.  She was treated with high-dose taper regiment of prednisone Repeat imaging studies after the palliative radiotherapy showed evidence for disease progression. Her molecular studies by [facility] recently showed no new resistant mutation.  After discussion of her treatment options including palliative care and hospice referral versus palliative systemic chemotherapy the patient was interested in proceeding palliative systemic chemotherapy.  She started  palliative systemic chemotherapy with carboplatin for AUC of 5 and Alimta 500 Mg/M2 every 3 weeks.  Status post 26 cycles.  Starting from cycle #7 the patient is on treatment with single agent Alimta every 3 weeks. I did not add a Avastin to her treatment because of the recent stroke.  She also continued her treatment with Tagrisso at the same time.  The patient has been off the maintenance treatment with Alimta for the last 3.5 months and she is feeling fine. She continues her treatment with Tagrisso 80 mg p.o. daily.  She is tolerating her treatment with Tagrisso fairly well with no concerning adverse effects. I recommended for the patient to continue her current treatment with Tagrisso with the same dose. I will see her back for follow-up visit in 6 weeks for evaluation with repeat CT scan of the chest, abdomen and pelvis for restaging of her disease. The patient was advised to call immediately if she has any other concerning symptoms in the interval.  The patient voices understanding of current disease status and treatment options and is in agreement with the current care plan.  All questions were answered. The patient knows to call the clinic with any problems, questions or concerns. We can certainly see the patient much sooner if necessary.  he total  time spent in the appointment was 20 minutes.  Disclaimer: This note was dictated with voice recognition  software. Similar sounding words can inadvertently be transcribed and may not be corrected upon review.

## 2023-01-01 ENCOUNTER — Encounter: Payer: Self-pay | Admitting: Physician Assistant

## 2023-01-01 ENCOUNTER — Other Ambulatory Visit (HOSPITAL_COMMUNITY): Payer: Self-pay

## 2023-01-01 ENCOUNTER — Encounter: Payer: Self-pay | Admitting: Internal Medicine

## 2023-01-01 MED FILL — Folic Acid Tab 1 MG: ORAL | 90 days supply | Qty: 90 | Fill #0 | Status: AC

## 2023-01-03 ENCOUNTER — Other Ambulatory Visit: Payer: Medicare HMO

## 2023-01-05 ENCOUNTER — Other Ambulatory Visit: Payer: Medicare HMO

## 2023-01-08 ENCOUNTER — Telehealth (HOSPITAL_COMMUNITY): Payer: Self-pay

## 2023-01-08 NOTE — Telephone Encounter (Signed)
Called pt to let her know about the Pulmonary Wellness program at [facility] Pt states she is interested. Will put referral in.

## 2023-01-22 ENCOUNTER — Other Ambulatory Visit: Payer: Medicare HMO

## 2023-01-22 ENCOUNTER — Inpatient Hospital Stay: Payer: Medicare HMO | Attending: Internal Medicine

## 2023-01-22 ENCOUNTER — Ambulatory Visit (HOSPITAL_COMMUNITY)
Admission: RE | Admit: 2023-01-22 | Discharge: 2023-01-22 | Disposition: A | Discharge status: Home / Self Care | Payer: Medicare HMO | Source: Ambulatory Visit | Attending: Internal Medicine | Admitting: Internal Medicine

## 2023-01-22 DIAGNOSIS — Z95828 Presence of other vascular implants and grafts: Secondary | ICD-10-CM

## 2023-01-22 DIAGNOSIS — C3490 Malignant neoplasm of unspecified part of unspecified bronchus or lung: Secondary | ICD-10-CM | POA: Insufficient documentation

## 2023-01-22 DIAGNOSIS — C7931 Secondary malignant neoplasm of brain: Secondary | ICD-10-CM | POA: Insufficient documentation

## 2023-01-22 DIAGNOSIS — C3411 Malignant neoplasm of upper lobe, right bronchus or lung: Secondary | ICD-10-CM | POA: Insufficient documentation

## 2023-01-22 LAB — CBC WITH DIFFERENTIAL (CANCER CENTER ONLY)
Abs Immature Granulocytes: 0.01 10*3/uL (ref 0.00–0.07)
Basophils Absolute: 0 10*3/uL (ref 0.0–0.1)
Basophils Relative: 0 %
Eosinophils Absolute: 0.1 10*3/uL (ref 0.0–0.5)
Eosinophils Relative: 3 %
HCT: 35.1 % — ABNORMAL LOW (ref 36.0–46.0)
Hemoglobin: 11.5 g/dL — ABNORMAL LOW (ref 12.0–15.0)
Immature Granulocytes: 0 %
Lymphocytes Relative: 42 %
Lymphs Abs: 1.2 10*3/uL (ref 0.7–4.0)
MCH: 26.9 pg (ref 26.0–34.0)
MCHC: 32.8 g/dL (ref 30.0–36.0)
MCV: 82.2 fL (ref 80.0–100.0)
Monocytes Absolute: 0.4 10*3/uL (ref 0.1–1.0)
Monocytes Relative: 13 %
Neutro Abs: 1.2 10*3/uL — ABNORMAL LOW (ref 1.7–7.7)
Neutrophils Relative %: 42 %
Platelet Count: 149 10*3/uL — ABNORMAL LOW (ref 150–400)
RBC: 4.27 MIL/uL (ref 3.87–5.11)
RDW: 14 % (ref 11.5–15.5)
WBC Count: 2.8 10*3/uL — ABNORMAL LOW (ref 4.0–10.5)
nRBC: 0 % (ref 0.0–0.2)

## 2023-01-22 LAB — CMP (CANCER CENTER ONLY)
ALT: 26 U/L (ref 0–44)
AST: 28 U/L (ref 15–41)
Albumin: 3.9 g/dL (ref 3.5–5.0)
Alkaline Phosphatase: 76 U/L (ref 38–126)
Anion gap: 6 (ref 5–15)
BUN: 16 mg/dL (ref 8–23)
CO2: 27 mmol/L (ref 22–32)
Calcium: 9.2 mg/dL (ref 8.9–10.3)
Chloride: 108 mmol/L (ref 98–111)
Creatinine: 1.43 mg/dL — ABNORMAL HIGH (ref 0.44–1.00)
GFR, Estimated: 39 mL/min — ABNORMAL LOW (ref >60)
Glucose, Bld: 91 mg/dL (ref 70–99)
Potassium: 4 mmol/L (ref 3.5–5.1)
Sodium: 141 mmol/L (ref 135–145)
Total Bilirubin: 0.3 mg/dL (ref 0.3–1.2)
Total Protein: 6.8 g/dL (ref 6.5–8.1)

## 2023-01-22 MED ORDER — SODIUM CHLORIDE 0.9% FLUSH
10.0000 mL | Freq: Once | INTRAVENOUS | Status: AC
  Administered 2023-01-22: 10 mL

## 2023-01-22 MED ORDER — HEPARIN SOD (PORK) LOCK FLUSH 100 UNIT/ML IV SOLN
INTRAVENOUS | Status: AC
  Filled 2023-01-22: qty 5

## 2023-01-22 MED ORDER — HEPARIN SOD (PORK) LOCK FLUSH 100 UNIT/ML IV SOLN
500.0000 [IU] | Freq: Once | INTRAVENOUS | Status: AC
  Administered 2023-01-22: 500 [IU] via INTRAVENOUS

## 2023-01-22 MED ORDER — IOHEXOL 300 MG/ML  SOLN
80.0000 mL | Freq: Once | INTRAMUSCULAR | Status: AC | PRN
  Administered 2023-01-22: 80 mL via INTRAVENOUS

## 2023-01-22 MED ORDER — SODIUM CHLORIDE (PF) 0.9 % IJ SOLN
INTRAMUSCULAR | Status: AC
  Filled 2023-01-22: qty 50

## 2023-01-24 ENCOUNTER — Ambulatory Visit: Payer: Medicare HMO | Admitting: Internal Medicine

## 2023-01-29 NOTE — Progress Notes (Unsigned)
Palliative Medicine [facility]  Telephone:(336) [phone] Fax:(336) [phone]   Name: Allison [name] Date: 01/29/2023 MRN: [id number]  DOB: 1951/03/17  Patient Care Team: [name], MD as PCP - General (Internal Medicine) Little Ishikawa, MD as PCP - Cardiology (Cardiology) Antony Blackbird, MD as Consulting Physician (Radiation Oncology) Pickenpack-Cousar, Arty Baumgartner, NP as Nurse Practitioner (Nurse Practitioner) Syliva Overman, RN as Oncology Nurse Navigator (Oncology) Si Gaul, MD as Consulting Physician (Oncology) Kermit Balo, DO (Geriatric Medicine)   INTERVAL HISTORY: Allison [name] is a 72 y.o. female with stage IV non-small cell lung cancer (12/2017), hypertension, CVA, CAD, DVT/PE (on Lovenox), and GERD.  Palliative ask to see for symptom management and goals of care.  SOCIAL HISTORY:     reports that she has never smoked. She has never used smokeless tobacco. She reports that she does not currently use alcohol. She reports that she does not use drugs.  ADVANCE DIRECTIVES:  None on file   CODE STATUS: DNR  PAST MEDICAL HISTORY: Past Medical History:  Diagnosis Date   Anemia    Anxiety    Arthritis    Asthma    exercise induced   Coronary artery disease    Depression    PMH   Dyspnea    GERD (gastroesophageal reflux disease)    Glaucoma    History of radiation therapy 01/05/2021   IMRT right lung  11/24/2020-01/05/2021  Dr Antony Blackbird   Hypertension    lung ca dx'd 11/2017   right   Malignant pleural effusion    right   Nuclear sclerotic cataract of right eye 02/28/2021   Dr. Sallye Lat, cataract surgery February 2023   PONV (postoperative nausea and vomiting)    Pre-diabetes    Raynaud's disease    Raynaud's disease    Stroke (HCC) 01/2021   balance off, some express aphasia, weakness    ALLERGIES:  is allergic to hydrocodone-acetaminophen, penicillins, lactose, vicodin [hydrocodone-acetaminophen],  lactase-lactobacillus, and other.  MEDICATIONS:  Current Outpatient Medications  Medication Sig Dispense Refill   acetaminophen (TYLENOL) 500 MG tablet Take 1,000 mg by mouth every 6 (six) hours as needed (pain).     ALPRAZolam (XANAX) 0.25 MG tablet Take 1 tablet (0.25 mg total) by mouth 2 (two) times daily as needed for anxiety. May take 2 tablets (0.5 mg total) at bedtime. 60 tablet 0   amLODipine (NORVASC) 5 MG tablet Take 1 tablet (5 mg total) by mouth daily. 90 tablet 3   carvedilol (COREG) 12.5 MG tablet Take 12.5 mg by mouth 2 (two) times daily with a meal.     CHELATED MAGNESIUM PO Take 200 mg by mouth daily.     Cholecalciferol (VITAMIN D) 125 MCG (5000 UT) CAPS Take 1 Capful by mouth daily.     dexamethasone (DECADRON) 4 MG tablet TAKE 1 TABLET TWICE A DAY THE DAY BEFORE, THE DAY OF, AND THE DAY AFTER CHEMOTHERAPY 40 tablet 2   docusate sodium (COLACE) 100 MG capsule Take 1 capsule (100 mg total) by mouth 2 (two) times daily. 60 capsule 5   dorzolamide-timolol (COSOPT) 22.3-6.8 MG/ML ophthalmic solution Place 1 drop into both eyes 2 (two) times daily.     enoxaparin (LOVENOX) 80 MG/0.8ML injection Inject 80 mg into the skin daily.     ezetimibe (ZETIA) 10 MG tablet Take 10 mg by mouth daily.     FeFum-FePoly-FA-B Cmp-C-Biot (FOLIVANE-PLUS) CAPS TAKE 1 CAPSULE BY MOUTH EVERY DAY IN THE MORNING 90  capsule 0   FLUoxetine (PROZAC) 20 MG capsule Take 1 capsule (20 mg total) by mouth daily. 90 capsule 3   folic acid (FOLVITE) 1 MG tablet TAKE 1 TABLET BY MOUTH EVERY DAY 90 tablet 1   hyoscyamine (LEVSIN SL) 0.125 MG SL tablet Place 1-2 tablets (0.125-0.25 mg total) under the tongue every 6 (six) hours as needed for cramping. 30 tablet 3   ipratropium-albuterol (DUONEB) 0.5-2.5 (3) MG/3ML SOLN Take 3 mLs by nebulization every 6 (six) hours as needed (Asthma).     isosorbide mononitrate (IMDUR) 30 MG 24 hr tablet Take 0.5 tablets (15 mg total) by mouth daily. 90 tablet 3   ketorolac (ACULAR)  0.5 % ophthalmic solution Place 1 drop into the left eye 4 (four) times daily starting one day before surgery. (Patient not taking: Reported on 12/12/2022) 5 mL 1   latanoprost (XALATAN) 0.005 % ophthalmic solution Place 1 drop into both eyes at bedtime.      ofloxacin (OCUFLOX) 0.3 % ophthalmic solution Place 1 drop into the left eye 4 (four) times daily. (Patient not taking: Reported on 12/12/2022)     ofloxacin (OCUFLOX) 0.3 % ophthalmic solution Place 1 drop into the left eye 4 (four) times daily starting one day before surgery. (Patient not taking: Reported on 12/12/2022) 5 mL 1   ondansetron (ZOFRAN-ODT) 4 MG disintegrating tablet Take 1 tablet (4 mg total) by mouth every 8 (eight) hours as needed for nausea or vomiting. 30 tablet 1   osimertinib mesylate (TAGRISSO) 80 MG tablet Take 1 tablet (80 mg total) by mouth daily. 30 tablet 4   Phenylephrine-DM-GG-APAP (MUCINEX FAST-MAX COLD/FLU MS PO) Take 1 Dose by mouth as needed (cough).     prednisoLONE acetate (PRED FORTE) 1 % ophthalmic suspension Place 1 drop into the left eye 4 (four) times daily.     prednisoLONE acetate (PRED FORTE) 1 % ophthalmic suspension Place 1 drop into the left eye 4 (four) times daily after surgery. (Patient not taking: Reported on 12/12/2022) 10 mL 1   prochlorperazine (COMPAZINE) 10 MG tablet Take 1 tablet (10 mg total) by mouth every 6 (six) hours as needed. 30 tablet 2   promethazine (PHENERGAN) 12.5 MG tablet Take 1 tablet (12.5 mg total) by mouth every 6 (six) hours as needed for nausea or vomiting. 30 tablet 2   REPATHA SURECLICK 140 MG/ML SOAJ INJECT 140 MG INTO THE SKIN EVERY 14 (FOURTEEN) DAYS. 6 mL 3   No current facility-administered medications for this visit.    VITAL SIGNS: LMP  (LMP Unknown)  There were no vitals filed for this visit.   Estimated body mass index is 22.57 kg/m as calculated from the following:   Height as of 12/14/22: 5\' 4"  (1.626 m).   Weight as of 12/14/22: 131 lb 8 oz (59.6  kg).   PERFORMANCE STATUS (ECOG) : 1 - Symptomatic but completely ambulatory  ASSESSMENT NAD, ambulatory with cane RRR Normal breathing pattern AAO x4  IMPRESSION:  Allison [name] presented to clinic today for follow-up. No acute distress. Her Significant Other is present. Patient reports overall she is doing well. Denies nausea, vomiting, constipation, or diarrhea. Denies pain or discomfort. Anxiety well controlled. Does not require daily Xanax. Sleeping well at night. Is remaining as active as possible.   Recent high school celebration. She did have an incidence where she become overheated and dehydrated requiring some emergency hydration with liquids and electrolytes on site. Continues to push fluids and electrolytes for support.   Allison [name] has  small area of redness and whelps to right inner arm (brachial) area. Reports being outside doing some yard work but no recollection of bug bites. Denies any new contact with irritants. Has been using neosporin to area. No other known rash, fever, or malaise. Discussed use of hydrocortisone cream to the area. She knows to contact office if area begins to worsen or spreads.   We discussed night sweats and ways to control for comfort.   Allison [name] was previously being followed by [name]'s palliative program. She shares that she has recently been discharged after being advised she no longer met criteria.   All questions answered and support provided. Patient knows to contact office with any needs.    PLAN: Symptoms are well controlled.  Nexium twice daily for indigestion. Tolerating well.  Anxiety well controlled. Occasional use of Xanax at bedtime.   Zofran as needed for nausea. Hydrocortisone to arm irritation. Knows to contact office if worsens.  Patient reports she was recently discharged from [name]'s home palliative program as she no longer met criteria.  I will plan to see back in 6-8 weeks in collaboration with her other oncology  appointments.    Patient expressed understanding and was in agreement with this plan. She also understands that She can call the clinic at any time with any questions, concerns, or complaints.     Any controlled substances utilized were prescribed in the context of palliative care. PDMP has been reviewed.    Visit consisted of counseling and education dealing with the complex and emotionally intense issues of symptom management and palliative care in the setting of serious and potentially life-threatening illness.Greater than 50%  of this time was spent counseling and coordinating care related to the above assessment and plan.  Willette Alma, AGPCNP-BC  Palliative Medicine Team/[facility]  *Please note that this is a verbal dictation therefore any spelling or grammatical errors are due to the "[employer]" system interpretation.

## 2023-01-30 ENCOUNTER — Inpatient Hospital Stay (HOSPITAL_BASED_OUTPATIENT_CLINIC_OR_DEPARTMENT_OTHER): Payer: Medicare HMO | Admitting: Internal Medicine

## 2023-01-30 ENCOUNTER — Inpatient Hospital Stay (HOSPITAL_BASED_OUTPATIENT_CLINIC_OR_DEPARTMENT_OTHER): Payer: Medicare HMO | Admitting: Nurse Practitioner

## 2023-01-30 ENCOUNTER — Other Ambulatory Visit: Payer: Self-pay

## 2023-01-30 ENCOUNTER — Encounter: Payer: Self-pay | Admitting: Nurse Practitioner

## 2023-01-30 VITALS — BP 138/70 | HR 64 | Temp 97.8°F | Resp 17 | Ht 64.0 in | Wt 130.6 lb

## 2023-01-30 DIAGNOSIS — Z515 Encounter for palliative care: Secondary | ICD-10-CM | POA: Diagnosis not present

## 2023-01-30 DIAGNOSIS — C3411 Malignant neoplasm of upper lobe, right bronchus or lung: Secondary | ICD-10-CM | POA: Diagnosis not present

## 2023-01-30 DIAGNOSIS — R21 Rash and other nonspecific skin eruption: Secondary | ICD-10-CM

## 2023-01-30 DIAGNOSIS — C3490 Malignant neoplasm of unspecified part of unspecified bronchus or lung: Secondary | ICD-10-CM

## 2023-01-30 DIAGNOSIS — F419 Anxiety disorder, unspecified: Secondary | ICD-10-CM

## 2023-01-30 DIAGNOSIS — C3491 Malignant neoplasm of unspecified part of right bronchus or lung: Secondary | ICD-10-CM

## 2023-01-30 NOTE — Progress Notes (Signed)
[facility] [phone]   [phone]  OFFICE PROGRESS NOTE  [name], MD [address] [place] [address]  DIAGNOSIS: Stage IV (T2 a,N2, M1a) non-small cell lung cancer, adenocarcinoma diagnosed in July 2019 and presented with right upper lobe lung mass in addition to mediastinal lymphadenopathy as well as bilateral pulmonary nodules and malignant right pleural effusion.   Biomarker Findings Microsatellite status - Cannot Be Determined Tumor Mutational Burden - Cannot Be Determined Genomic Findings For a complete list of the genes assayed, please refer to the Appendix. EGFR exon 19 deletion ([id number]) TP53 Y220C 7 Disease relevant genes with no reportable alterations: KRAS, ALK, BRAF, MET, RET, ERBB2, ROS1    PRIOR THERAPY:  1) Status post right Pleurx catheter placement by Dr. Donata Clay for drainage of malignant right pleural effusion. 2) palliative radiotherapy to the enlarging right upper lobe lung mass and mediastinum under the care of Dr. Roselind Messier expected to be completed on January 05, 2021. 3) Tagrisso 80 mg p.o. daily.  First dose was given on 01/29/2018.  Status post 60 months of treatment.   CURRENT THERAPY: Systemic chemotherapy with carboplatin for AUC of 5 and Alimta 500 Mg/M2 every 3 weeks.  First dose 03/17/2021.  The patient will also continue her current treatment with Tagrisso 80 mg p.o. daily.  She is status post 26 cycles. Starting from cycle #7, she will be on Alimta only 400 mg/m2.  Her treatment with Alimta has been on hold for several months now.  She is currently on treatment with single agent Tagrisso 80 mg p.o. daily  INTERVAL HISTORY: Allison [name] 72 y.o. female returns to the clinic today for follow-up visit accompanied by her boyfriend, Allison [name].  The patient is feeling fine today with no concerning complaints except for mild fatigue and small area of skin rash on the right forearm.  She denied  having any current chest pain but has shortness of breath with exertion with no cough or hemoptysis.  She has no nausea, vomiting, diarrhea or constipation.  She has no headache or visual changes.  She denied having any significant weight loss or night sweats.  She has no fever or chills.  She is here today for evaluation with repeat CT scan of the chest, abdomen and pelvis for restaging of her disease.  MEDICAL HISTORY: Past Medical History:  Diagnosis Date   Anemia    Anxiety    Arthritis    Asthma    exercise induced   Coronary artery disease    Depression    PMH   Dyspnea    GERD (gastroesophageal reflux disease)    Glaucoma    History of radiation therapy 01/05/2021   IMRT right lung  11/24/2020-01/05/2021  Dr Antony Blackbird   Hypertension    lung ca dx'd 11/2017   right   Malignant pleural effusion    right   Nuclear sclerotic cataract of right eye 02/28/2021   Dr. Sallye Lat, cataract surgery February 2023   PONV (postoperative nausea and vomiting)    Pre-diabetes    Raynaud's disease    Raynaud's disease    Stroke (HCC) 01/2021   balance off, some express aphasia, weakness    ALLERGIES:  is allergic to hydrocodone-acetaminophen, penicillins, lactose, vicodin [hydrocodone-acetaminophen], lactase-lactobacillus, and other.  MEDICATIONS:  Current Outpatient Medications  Medication Sig Dispense Refill   acetaminophen (TYLENOL) 500 MG tablet Take 1,000 mg by mouth every 6 (six) hours as needed (pain).  ALPRAZolam (XANAX) 0.25 MG tablet Take 1 tablet (0.25 mg total) by mouth 2 (two) times daily as needed for anxiety. May take 2 tablets (0.5 mg total) at bedtime. 60 tablet 0   amLODipine (NORVASC) 5 MG tablet Take 1 tablet (5 mg total) by mouth daily. 90 tablet 3   carvedilol (COREG) 12.5 MG tablet Take 12.5 mg by mouth 2 (two) times daily with a meal.     CHELATED MAGNESIUM PO Take 200 mg by mouth daily.     Cholecalciferol (VITAMIN D) 125 MCG (5000 UT) CAPS Take 1  Capful by mouth daily.     dexamethasone (DECADRON) 4 MG tablet TAKE 1 TABLET TWICE A DAY THE DAY BEFORE, THE DAY OF, AND THE DAY AFTER CHEMOTHERAPY 40 tablet 2   docusate sodium (COLACE) 100 MG capsule Take 1 capsule (100 mg total) by mouth 2 (two) times daily. 60 capsule 5   dorzolamide-timolol (COSOPT) 22.3-6.8 MG/ML ophthalmic solution Place 1 drop into both eyes 2 (two) times daily.     enoxaparin (LOVENOX) 80 MG/0.8ML injection Inject 80 mg into the skin daily.     ezetimibe (ZETIA) 10 MG tablet Take 10 mg by mouth daily.     FeFum-FePoly-FA-B Cmp-C-Biot (FOLIVANE-PLUS) CAPS TAKE 1 CAPSULE BY MOUTH EVERY DAY IN THE MORNING 90 capsule 0   FLUoxetine (PROZAC) 20 MG capsule Take 1 capsule (20 mg total) by mouth daily. 90 capsule 3   folic acid (FOLVITE) 1 MG tablet TAKE 1 TABLET BY MOUTH EVERY DAY 90 tablet 1   hyoscyamine (LEVSIN SL) 0.125 MG SL tablet Place 1-2 tablets (0.125-0.25 mg total) under the tongue every 6 (six) hours as needed for cramping. 30 tablet 3   ipratropium-albuterol (DUONEB) 0.5-2.5 (3) MG/3ML SOLN Take 3 mLs by nebulization every 6 (six) hours as needed (Asthma).     isosorbide mononitrate (IMDUR) 30 MG 24 hr tablet Take 0.5 tablets (15 mg total) by mouth daily. 90 tablet 3   ketorolac (ACULAR) 0.5 % ophthalmic solution Place 1 drop into the left eye 4 (four) times daily starting one day before surgery. (Patient not taking: Reported on 12/12/2022) 5 mL 1   latanoprost (XALATAN) 0.005 % ophthalmic solution Place 1 drop into both eyes at bedtime.      ofloxacin (OCUFLOX) 0.3 % ophthalmic solution Place 1 drop into the left eye 4 (four) times daily. (Patient not taking: Reported on 12/12/2022)     ofloxacin (OCUFLOX) 0.3 % ophthalmic solution Place 1 drop into the left eye 4 (four) times daily starting one day before surgery. (Patient not taking: Reported on 12/12/2022) 5 mL 1   ondansetron (ZOFRAN-ODT) 4 MG disintegrating tablet Take 1 tablet (4 mg total) by mouth every 8 (eight)  hours as needed for nausea or vomiting. 30 tablet 1   osimertinib mesylate (TAGRISSO) 80 MG tablet Take 1 tablet (80 mg total) by mouth daily. 30 tablet 4   Phenylephrine-DM-GG-APAP (MUCINEX FAST-MAX COLD/FLU MS PO) Take 1 Dose by mouth as needed (cough).     prednisoLONE acetate (PRED FORTE) 1 % ophthalmic suspension Place 1 drop into the left eye 4 (four) times daily.     prednisoLONE acetate (PRED FORTE) 1 % ophthalmic suspension Place 1 drop into the left eye 4 (four) times daily after surgery. (Patient not taking: Reported on 12/12/2022) 10 mL 1   prochlorperazine (COMPAZINE) 10 MG tablet Take 1 tablet (10 mg total) by mouth every 6 (six) hours as needed. 30 tablet 2   promethazine (PHENERGAN) 12.5 MG  tablet Take 1 tablet (12.5 mg total) by mouth every 6 (six) hours as needed for nausea or vomiting. 30 tablet 2   REPATHA SURECLICK 140 MG/ML SOAJ INJECT 140 MG INTO THE SKIN EVERY 14 (FOURTEEN) DAYS. 6 mL 3   No current facility-administered medications for this visit.    SURGICAL HISTORY:  Past Surgical History:  Procedure Laterality Date   ABDOMINAL HYSTERECTOMY     partial   BRONCHIAL BIOPSY  04/21/2021   Procedure: BRONCHIAL BIOPSIES;  Surgeon: Josephine Igo, DO;  Location: [facility];  Service: Pulmonary;;   BRONCHIAL BRUSHINGS  04/21/2021   Procedure: BRONCHIAL BRUSHINGS;  Surgeon: Josephine Igo, DO;  Location: [facility];  Service: Pulmonary;;   BRONCHIAL NEEDLE ASPIRATION BIOPSY  04/21/2021   Procedure: BRONCHIAL NEEDLE ASPIRATION BIOPSIES;  Surgeon: Josephine Igo, DO;  Location: [facility];  Service: Pulmonary;;   CHEST TUBE INSERTION Right 01/01/2018   Procedure: INSERTION PLEURAL DRAINAGE CATHETER;  Surgeon: Kerin Perna, MD;  Location[facility] OR;  Service: Thoracic;  Laterality: Right;   CHEST TUBE INSERTION  04/21/2021   Procedure: CHEST TUBE INSERTION;  Surgeon: Josephine Igo, DO;  Location: [facility];  Service: Pulmonary;;   COLONOSCOPY     CORONARY STENT  INTERVENTION N/A 06/16/2019   Procedure: CORONARY STENT INTERVENTION;  Surgeon: Corky Crafts, MD;  Location: [facility];  Service: Cardiovascular;  Laterality: N/A;   DILATION AND CURETTAGE OF UTERUS     EYE SURGERY     due to Glaucoma   IR IMAGING GUIDED PORT INSERTION  03/22/2021   IR PORT REPAIR CENTRAL VENOUS ACCESS DEVICE  04/15/2021   LEFT HEART CATH AND CORONARY ANGIOGRAPHY N/A 06/16/2019   Procedure: LEFT HEART CATH AND CORONARY ANGIOGRAPHY;  Surgeon: Corky Crafts, MD;  Location: [facility]VAS[facility] CV LAB;  Service: Cardiovascular;  Laterality: N/A;   REMOVAL OF PLEURAL DRAINAGE CATHETER Right 11/07/2018   Procedure: REMOVAL OF PLEURAL DRAINAGE CATHETER;  Surgeon: Kerin Perna, MD;  Location: [facility] OR;  Service: Thoracic;  Laterality: Right;   ROTATOR CUFF REPAIR     TUBAL LIGATION     VIDEO BRONCHOSCOPY WITH ENDOBRONCHIAL NAVIGATION Bilateral 04/21/2021   Procedure: VIDEO BRONCHOSCOPY WITH ENDOBRONCHIAL NAVIGATION;  Surgeon: Josephine Igo, DO;  Location: [facility];  Service: Pulmonary;  Laterality: Bilateral;  ION   WISDOM TOOTH EXTRACTION      REVIEW OF SYSTEMS:  Constitutional: positive for fatigue Eyes: negative Ears, nose, mouth, throat, and face: negative Respiratory: positive for dyspnea on exertion Cardiovascular: negative Gastrointestinal: negative Genitourinary:negative Integument/breast: negative Hematologic/lymphatic: negative Musculoskeletal:negative Neurological: negative Behavioral/Psych: negative Endocrine: negative Allergic/Immunologic: negative   PHYSICAL EXAMINATION: General appearance: alert, cooperative, appears stated age, and no distress Head: Normocephalic, without obvious abnormality, atraumatic Neck: no adenopathy, no JVD, supple, symmetrical, trachea midline, and thyroid not enlarged, symmetric, no tenderness/mass/nodules Lymph nodes: Cervical, supraclavicular, and axillary nodes normal. Resp: clear to auscultation  bilaterally Back: symmetric, no curvature. ROM normal. No CVA tenderness. Cardio: regular rate and rhythm, S1, S2 normal, no murmur, click, rub or gallop GI: soft, non-tender; bowel sounds normal; no masses,  no organomegaly Extremities: extremities normal, atraumatic, no cyanosis or edema Neurologic: Alert and oriented X 3, normal strength and tone. Normal symmetric reflexes. Normal coordination and gait  ECOG PERFORMANCE STATUS: 1 - Symptomatic but completely ambulatory  Blood pressure 138/70, pulse 64, temperature 97.8 F (36.6 C), temperature source Oral, resp. rate 17, height 5\' 4"  (1.626 m), weight 130 lb 9.6 oz (59.2 kg), SpO2  100%.  LABORATORY DATA: Lab Results  Component Value Date   WBC 2.8 (L) 01/22/2023   HGB 11.5 (L) 01/22/2023   HCT 35.1 (L) 01/22/2023   MCV 82.2 01/22/2023   PLT 149 (L) 01/22/2023      Chemistry      Component Value Date/Time   NA 141 01/22/2023 0738   NA 140 03/04/2021 1516   K 4.0 01/22/2023 0738   CL 108 01/22/2023 0738   CO2 27 01/22/2023 0738   BUN 16 01/22/2023 0738   BUN 21 03/04/2021 1516   CREATININE 1.43 (H) 01/22/2023 0738      Component Value Date/Time   CALCIUM 9.2 01/22/2023 0738   ALKPHOS 76 01/22/2023 0738   AST 28 01/22/2023 0738   ALT 26 01/22/2023 0738   BILITOT 0.3 01/22/2023 0738       RADIOGRAPHIC STUDIES: CT Chest W Contrast  Result Date: 01/24/2023 CLINICAL DATA:  Non-small cell lung cancer.  * Tracking Code: BO * EXAM: CT CHEST, ABDOMEN, AND PELVIS WITH CONTRAST TECHNIQUE: Multidetector CT imaging of the chest, abdomen and pelvis was performed following the standard protocol during bolus administration of intravenous contrast. RADIATION DOSE REDUCTION: This exam was performed according to the departmental dose-optimization program which includes automated exposure control, adjustment of the mA and/or kV according to patient size and/or use of iterative reconstruction technique. CONTRAST:  80mL OMNIPAQUE IOHEXOL 300  MG/ML  SOLN COMPARISON:  CT 10/31/2022 FINDINGS: CT CHEST FINDINGS Cardiovascular: Port in the anterior chest wall with tip in distal SVC. No significant vascular findings. Normal heart size. No pericardial effusion. Mediastinum/Nodes: No axillary or supraclavicular adenopathy. No mediastinal or hilar adenopathy. No pericardial fluid. Esophagus normal. Lungs/Pleura: Consolidation at the RIGHT lung apex unchanged. Volume loss in the RIGHT hemithorax is stable. Perihilar reticular pattern mild bronchiectasis is unchanged. Mild nodular thickening along the fissure in the RIGHT lung (image [phone]) is unchanged. No suspicious nodularity. LEFT lung clear. Musculoskeletal: No aggressive osseous lesion. CT ABDOMEN AND PELVIS FINDINGS Hepatobiliary: No focal hepatic lesion. No biliary ductal dilatation. Gallbladder is normal. Common bile duct is normal. Pancreas: Pancreas is normal. No ductal dilatation. No pancreatic inflammation. Spleen: Normal spleen Adrenals/urinary tract: Adrenal glands and kidneys are normal. The ureters and bladder normal. Stomach/Bowel: Stomach, small bowel, appendix, and cecum are normal. The colon and rectosigmoid colon are normal. Vascular/Lymphatic: Abdominal aorta is normal caliber with atherosclerotic calcification. There is no retroperitoneal or periportal lymphadenopathy. No pelvic lymphadenopathy. Reproductive: Post hysterectomy.  Adnexa unremarkable Other: No peritoneal nodularity. Musculoskeletal: No aggressive osseous lesion. IMPRESSION: CHEST: 1. Stable post treatment changes in the RIGHT lung. 2. No evidence of recurrent lung cancer. PELVIS: 1. No evidence of metastatic disease in the abdomen pelvis. 2.  Aortic Atherosclerosis (ICD10-I70.0). Electronically Signed   By: Genevive Bi M.D.   On: 01/24/2023 18:53   CT Abdomen Pelvis W Contrast  Result Date: 01/24/2023 CLINICAL DATA:  Non-small cell lung cancer.  * Tracking Code: BO * EXAM: CT CHEST, ABDOMEN, AND PELVIS WITH CONTRAST  TECHNIQUE: Multidetector CT imaging of the chest, abdomen and pelvis was performed following the standard protocol during bolus administration of intravenous contrast. RADIATION DOSE REDUCTION: This exam was performed according to the departmental dose-optimization program which includes automated exposure control, adjustment of the mA and/or kV according to patient size and/or use of iterative reconstruction technique. CONTRAST:  80mL OMNIPAQUE IOHEXOL 300 MG/ML  SOLN COMPARISON:  CT 10/31/2022 FINDINGS: CT CHEST FINDINGS Cardiovascular: Port in the anterior chest wall with tip in distal  SVC. No significant vascular findings. Normal heart size. No pericardial effusion. Mediastinum/Nodes: No axillary or supraclavicular adenopathy. No mediastinal or hilar adenopathy. No pericardial fluid. Esophagus normal. Lungs/Pleura: Consolidation at the RIGHT lung apex unchanged. Volume loss in the RIGHT hemithorax is stable. Perihilar reticular pattern mild bronchiectasis is unchanged. Mild nodular thickening along the fissure in the RIGHT lung (image [phone]) is unchanged. No suspicious nodularity. LEFT lung clear. Musculoskeletal: No aggressive osseous lesion. CT ABDOMEN AND PELVIS FINDINGS Hepatobiliary: No focal hepatic lesion. No biliary ductal dilatation. Gallbladder is normal. Common bile duct is normal. Pancreas: Pancreas is normal. No ductal dilatation. No pancreatic inflammation. Spleen: Normal spleen Adrenals/urinary tract: Adrenal glands and kidneys are normal. The ureters and bladder normal. Stomach/Bowel: Stomach, small bowel, appendix, and cecum are normal. The colon and rectosigmoid colon are normal. Vascular/Lymphatic: Abdominal aorta is normal caliber with atherosclerotic calcification. There is no retroperitoneal or periportal lymphadenopathy. No pelvic lymphadenopathy. Reproductive: Post hysterectomy.  Adnexa unremarkable Other: No peritoneal nodularity. Musculoskeletal: No aggressive osseous lesion.  IMPRESSION: CHEST: 1. Stable post treatment changes in the RIGHT lung. 2. No evidence of recurrent lung cancer. PELVIS: 1. No evidence of metastatic disease in the abdomen pelvis. 2.  Aortic Atherosclerosis (ICD10-I70.0). Electronically Signed   By: Genevive Bi M.D.   On: 01/24/2023 18:53    ASSESSMENT AND PLAN: This is a very pleasant 72 years old never smoker African-American female recently with a stage IV non-small cell lung cancer, adenocarcinoma with positive EGFR mutation with deletion in exon 19. The patient was started on treatment with Tagrisso 80 mg p.o. daily status post 35 months of treatment. She has been tolerating this treatment well with no concerning adverse effects except for intermittent diarrhea. She had repeat CT scan of the chest, abdomen pelvis performed recently.  I personally and independently reviewed the scans and discussed the results with the patient and her boyfriend today. Unfortunately the CT scan showed interval progression of the right apical lung mass in addition to progression of mediastinal lymphadenopathy concerning for worsening of her disease. She has no actionable resistant mutation on the molecular studies performed by [facility]. The patient continued her current treatment with Tagrisso and tolerating it fairly well. 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 Roselind Messier completed January 05, 2021. The patient had significant opacities in her lung that was initially thought to be secondary to radiation treatment versus Tagrisso induced pneumonitis versus lymphangitic spread of the tumor.  She was treated with high-dose taper regiment of prednisone Repeat imaging studies after the palliative radiotherapy showed evidence for disease progression. Her molecular studies by [facility] recently showed no new resistant mutation.  After discussion of her  treatment options including palliative care and hospice referral versus palliative systemic chemotherapy the patient was interested in proceeding palliative systemic chemotherapy.  She started  palliative systemic chemotherapy with carboplatin for AUC of 5 and Alimta 500 Mg/M2 every 3 weeks.  Status post 26 cycles.  Starting from cycle #7 the patient is on treatment with single agent Alimta every 3 weeks. I did not add a Avastin to her treatment because of the recent stroke.  She also continued her treatment with Tagrisso at the same time.  Her treatment with Alimta was discontinued more than 9 months ago secondary to intolerance and fatigue. She has been tolerating her treatment with Tagrisso fairly well. She had repeat CT scan of the chest, abdomen and pelvis  performed recently.  I personally and independently reviewed the scan and discussed the result with the patient and her boyfriend and daughter who was available by phone. Her scan showed no concerning findings for disease progression. I recommended for her to continue on her current treatment with Tagrisso with the same dose.  Today she completed 5 years of this treatment. I will see her back for follow-up visit in 3 months for evaluation with repeat blood work as well as CT scan of the chest, abdomen and pelvis for restaging of her disease. The patient was advised to call immediately if she has any other concerning symptoms in the interval. The patient voices understanding of current disease status and treatment options and is in agreement with the current care plan.  All questions were answered. The patient knows to call the clinic with any problems, questions or concerns. We can certainly see the patient much sooner if necessary.  he total time spent in the appointment was 30 minutes.  Disclaimer: This note was dictated with voice recognition software. Similar sounding words can inadvertently be transcribed and may not be corrected upon  review.

## 2023-03-04 NOTE — Progress Notes (Deleted)
Cardiology Clinic Note   Patient Name: Allison [name] Date of Encounter: 03/04/2023  Primary Care Provider:  [name], MD Primary Cardiologist:  Allison Ishikawa, MD  Patient Profile    72 year old female with history of hypertension, status post DES to RCA 06/16/19, stage IV lung cancer, hypertension, hyperlipidemia, DVT/PE, CVA.  When last seen office on 12/12/2022 she was complaining of lightheadedness and dizziness.  She was on a 3-week break from chemotherapy for stage IV lung cancer.  A cardiac ZIO monitor had been placed and February 2023 which revealed 12 episodes of SVT but no other significant arrhythmias.  On that visit dated 12/12/2022, I decreased her isosorbide from 60 mg daily to 30 mg daily to allow her to have better blood pressure in the afternoon which may be affecting her symptoms.  Medications were reviewed and she had multiple duplicates.  She was to review all of her medications and bring them with her on next office visit.  She was to keep up with her blood pressure at home.  Past Medical History    Past Medical History:  Diagnosis Date   Anemia    Anxiety    Arthritis    Asthma    exercise induced   Coronary artery disease    Depression    PMH   Dyspnea    GERD (gastroesophageal reflux disease)    Glaucoma    History of radiation therapy 01/05/2021   IMRT right lung  11/24/2020-01/05/2021  Allison [name]   Hypertension    lung ca dx'd 11/2017   right   Malignant pleural effusion    right   Nuclear sclerotic cataract of right eye 02/28/2021   Allison [name], cataract surgery February 2023   PONV (postoperative nausea and vomiting)    Pre-diabetes    Raynaud's disease    Raynaud's disease    Stroke (HCC) 01/2021   balance off, some express aphasia, weakness   Past Surgical History:  Procedure Laterality Date   ABDOMINAL HYSTERECTOMY     partial   BRONCHIAL BIOPSY  04/21/2021   Procedure: BRONCHIAL BIOPSIES;  Surgeon: Allison Igo, DO;  Location: [facility];  Service: Pulmonary;;   BRONCHIAL BRUSHINGS  04/21/2021   Procedure: BRONCHIAL BRUSHINGS;  Surgeon: Allison Igo, DO;  Location: [facility];  Service: Pulmonary;;   BRONCHIAL NEEDLE ASPIRATION BIOPSY  04/21/2021   Procedure: BRONCHIAL NEEDLE ASPIRATION BIOPSIES;  Surgeon: Allison Igo, DO;  Location: [facility];  Service: Pulmonary;;   CHEST TUBE INSERTION Right 01/01/2018   Procedure: INSERTION PLEURAL DRAINAGE CATHETER;  Surgeon: Allison Perna, MD;  Location: [facility] OR;  Service: Thoracic;  Laterality: Right;   CHEST TUBE INSERTION  04/21/2021   Procedure: CHEST TUBE INSERTION;  Surgeon: Allison Igo, DO;  Location: [facility];  Service: Pulmonary;;   COLONOSCOPY     CORONARY STENT INTERVENTION N/A 06/16/2019   Procedure: CORONARY STENT INTERVENTION;  Surgeon: Allison Crafts, MD;  Location: [facility];  Service: Cardiovascular;  Laterality: N/A;   DILATION AND CURETTAGE OF UTERUS     EYE SURGERY     due to Glaucoma   IR IMAGING GUIDED PORT INSERTION  03/22/2021   IR PORT REPAIR CENTRAL VENOUS ACCESS DEVICE  04/15/2021   LEFT HEART CATH AND CORONARY ANGIOGRAPHY N/A 06/16/2019   Procedure: LEFT HEART CATH AND CORONARY ANGIOGRAPHY;  Surgeon: Allison Crafts, MD;  Location: [facility] INVASIVE CV LAB;  Service: Cardiovascular;  Laterality: N/A;  REMOVAL OF PLEURAL DRAINAGE CATHETER Right 11/07/2018   Procedure: REMOVAL OF PLEURAL DRAINAGE CATHETER;  Surgeon: Allison Perna, MD;  Location: [facility] OR;  Service: Thoracic;  Laterality: Right;   ROTATOR CUFF REPAIR     TUBAL LIGATION     VIDEO BRONCHOSCOPY WITH ENDOBRONCHIAL NAVIGATION Bilateral 04/21/2021   Procedure: VIDEO BRONCHOSCOPY WITH ENDOBRONCHIAL NAVIGATION;  Surgeon: Allison Igo, DO;  Location: [facility];  Service: Pulmonary;  Laterality: Bilateral;  ION   WISDOM TOOTH EXTRACTION      Allergies  Allergies  Allergen Reactions   Hydrocodone-Acetaminophen Other (See  Comments)   Penicillins Other (See Comments)    SYNCOPE PATIENT HAS HAD A PCN REACTION WITH IMMEDIATE RASH, FACIAL/TONGUE/THROAT SWELLING, SOB, OR LIGHTHEADEDNESS WITH HYPOTENSION:  #  #  YES  #  # Has patient had a PCN reaction causing severe rash involving mucus membranes or skin necrosis: No Has patient had a PCN reaction that required hospitalization: No Has patient had a PCN reaction occurring within the last 10 years: No  Other reaction(s): other   Lactose Other (See Comments)    Allergy to all dairy products Abdominal cramping   Vicodin [Hydrocodone-Acetaminophen] Other (See Comments)    Sped up heart and breathing   Lactase-Lactobacillus Nausea And Vomiting   Other Other (See Comments)    Glaucoma eye drop - turned eyes dark    History of Present Illness    Allison [name] comes today for reevaluation of blood pressure control, with symptoms of lightheadedness and dizziness, which she expressed on last office visit.  She continues to have chemotherapy for stage IV lung cancer.  Medications were adjusted as discussed above.  Home Medications    Current Outpatient Medications  Medication Sig Dispense Refill   acetaminophen (TYLENOL) 500 MG tablet Take 1,000 mg by mouth every 6 (six) hours as needed (pain).     ALPRAZolam (XANAX) 0.25 MG tablet Take 1 tablet (0.25 mg total) by mouth 2 (two) times daily as needed for anxiety. May take 2 tablets (0.5 mg total) at bedtime. 60 tablet 0   amLODipine (NORVASC) 5 MG tablet Take 1 tablet (5 mg total) by mouth daily. 90 tablet 3   carvedilol (COREG) 12.5 MG tablet Take 12.5 mg by mouth 2 (two) times daily with a meal.     CHELATED MAGNESIUM PO Take 200 mg by mouth daily.     Cholecalciferol (VITAMIN D) 125 MCG (5000 UT) CAPS Take 1 Capful by mouth daily.     docusate sodium (COLACE) 100 MG capsule Take 1 capsule (100 mg total) by mouth 2 (two) times daily. 60 capsule 5   dorzolamide-timolol (COSOPT) 22.3-6.8 MG/ML ophthalmic solution Place  1 drop into both eyes 2 (two) times daily.     enoxaparin (LOVENOX) 80 MG/0.8ML injection Inject 80 mg into the skin daily.     ezetimibe (ZETIA) 10 MG tablet Take 10 mg by mouth daily.     FeFum-FePoly-FA-B Cmp-C-Biot (FOLIVANE-PLUS) CAPS TAKE 1 CAPSULE BY MOUTH EVERY DAY IN THE MORNING 90 capsule 0   FLUoxetine (PROZAC) 20 MG capsule Take 1 capsule (20 mg total) by mouth daily. 90 capsule 3   folic acid (FOLVITE) 1 MG tablet TAKE 1 TABLET BY MOUTH EVERY DAY 90 tablet 1   hyoscyamine (LEVSIN SL) 0.125 MG SL tablet Place 1-2 tablets (0.125-0.25 mg total) under the tongue every 6 (six) hours as needed for cramping. 30 tablet 3   ipratropium-albuterol (DUONEB) 0.5-2.5 (3) MG/3ML SOLN Take 3 mLs by nebulization every  6 (six) hours as needed (Asthma).     isosorbide mononitrate (IMDUR) 30 MG 24 hr tablet Take 0.5 tablets (15 mg total) by mouth daily. 90 tablet 3   latanoprost (XALATAN) 0.005 % ophthalmic solution Place 1 drop into both eyes at bedtime.      ondansetron (ZOFRAN-ODT) 4 MG disintegrating tablet Take 1 tablet (4 mg total) by mouth every 8 (eight) hours as needed for nausea or vomiting. 30 tablet 1   osimertinib mesylate (TAGRISSO) 80 MG tablet Take 1 tablet (80 mg total) by mouth daily. 30 tablet 4   Phenylephrine-DM-GG-APAP (MUCINEX FAST-MAX COLD/FLU MS PO) Take 1 Dose by mouth as needed (cough).     prochlorperazine (COMPAZINE) 10 MG tablet Take 1 tablet (10 mg total) by mouth every 6 (six) hours as needed. 30 tablet 2   promethazine (PHENERGAN) 12.5 MG tablet Take 1 tablet (12.5 mg total) by mouth every 6 (six) hours as needed for nausea or vomiting. 30 tablet 2   REPATHA SURECLICK 140 MG/ML SOAJ INJECT 140 MG INTO THE SKIN EVERY 14 (FOURTEEN) DAYS. 6 mL 3   No current facility-administered medications for this visit.     Family History    Family History  Problem Relation Age of Onset   Heart disease Sister    Heart disease Brother    Lung cancer Other    She indicated that  her mother is deceased. She indicated that her father is deceased. She indicated that her sister is alive. She indicated that her brother is deceased. She indicated that the status of her other is unknown.  Social History    Social History   Socioeconomic History   Marital status: Widowed    Spouse name: Not on file   Number of children: 3   Years of education: Not on file   Highest education level: Not on file  Occupational History    Employer: [employer]  Tobacco Use   Smoking status: Never   Smokeless tobacco: Never  Vaping Use   Vaping status: Never Used  Substance and Sexual Activity   Alcohol use: Not Currently    Comment: up to 3 drinks per week   Drug use: No    Comment: CBD oil    Sexual activity: Not Currently    Comment: Hysterectomy  Other Topics Concern   Not on file  Social History Narrative   Not on file   Social Determinants of Health   Financial Resource Strain: Low Risk  (07/22/2019)   Overall Financial Resource Strain (CARDIA)    Difficulty of Paying Living Expenses: Not hard at all  Food Insecurity: No Food Insecurity (07/22/2019)   Hunger Vital Sign    Worried About Running Out of Food in the Last Year: Never true    Ran Out of Food in the Last Year: Never true  Transportation Needs: No Transportation Needs (07/22/2019)   PRAPARE - Administrator, Civil Service (Medical): No    Lack of Transportation (Non-Medical): No  Physical Activity: Sufficiently Active (07/22/2019)   Exercise Vital Sign    Days of Exercise per Week: 5 days    Minutes of Exercise per Session: 30 min  Stress: Not on file  Social Connections: Unknown (07/22/2019)   Social Connection and Isolation Panel [NHANES]    Frequency of Communication with Friends and Family: Three times a week    Frequency of Social Gatherings with Friends and Family: Once a week    Attends Religious Services: Not  on file    Active Member of Clubs or Organizations: Yes    Attends Banker  Meetings: More than 4 times per year    Marital Status: Living with partner  Intimate Partner Violence: Not on file     Review of Systems    General:  No chills, fever, night sweats or weight changes.  Cardiovascular:  No chest pain, dyspnea on exertion, edema, orthopnea, palpitations, paroxysmal nocturnal dyspnea. Dermatological: No rash, lesions/masses Respiratory: No cough, dyspnea Urologic: No hematuria, dysuria Abdominal:   No nausea, vomiting, diarrhea, bright red blood per rectum, melena, or hematemesis Neurologic:  No visual changes, wkns, changes in mental status. All other systems reviewed and are otherwise negative except as noted above.       Physical Exam    VS:  LMP  (LMP Unknown)  , BMI There is no height or weight on file to calculate BMI.     GEN: Well nourished, well developed, in no acute distress. HEENT: normal. Neck: Supple, no JVD, carotid bruits, or masses. Cardiac: RRR, no murmurs, rubs, or gallops. No clubbing, cyanosis, edema.  Radials/DP/PT 2+ and equal bilaterally.  Respiratory:  Respirations regular and unlabored, clear to auscultation bilaterally. GI: Soft, nontender, nondistended, BS + x 4. MS: no deformity or atrophy. Skin: warm and dry, no rash. Neuro:  Strength and sensation are intact. Psych: Normal affect.      Lab Results  Component Value Date   WBC 2.8 (L) 01/22/2023   HGB 11.5 (L) 01/22/2023   HCT 35.1 (L) 01/22/2023   MCV 82.2 01/22/2023   PLT 149 (L) 01/22/2023   Lab Results  Component Value Date   CREATININE 1.43 (H) 01/22/2023   BUN 16 01/22/2023   NA 141 01/22/2023   K 4.0 01/22/2023   CL 108 01/22/2023   CO2 27 01/22/2023   Lab Results  Component Value Date   ALT 26 01/22/2023   AST 28 01/22/2023   ALKPHOS 76 01/22/2023   BILITOT 0.3 01/22/2023   Lab Results  Component Value Date   CHOL 175 02/12/2021   HDL 62 02/12/2021   LDLCALC 91 02/12/2021   TRIG 110 02/12/2021   CHOLHDL 2.8 02/12/2021    Lab Results   Component Value Date   HGBA1C 5.7 (H) 02/12/2021     Review of Prior Studies     NM Stress Test 03/29/2022   The study is normal. The study is low risk.   No ST deviation was noted.   Left ventricular function is normal. Nuclear stress EF: 68 %. The left ventricular ejection fraction is hyperdynamic (>65%). End diastolic cavity size is normal.   Prior study available for comparison from 03/19/2020.   Normal stress nuclear study with no ischemia or infarction; gated EF 68 with normal wall motion.     Echocardiogram 02/13/2021   1. Left ventricular ejection fraction, by estimation, is 60 to 65%. The  left ventricle has normal function. The left ventricle has no regional  wall motion abnormalities.   2. The mitral valve is normal in structure. Mild to moderate mitral valve  regurgitation.   Assessment & Plan   1.  ***     {Are you ordering a CV Procedure (e.g. stress test, cath, DCCV, TEE, etc)?   Press F2        :[id number]}   Signed, Bettey Mare. Liborio Nixon, ANP, AACC   03/04/2023 11:50 AM      Office [phone] Fax [phone]  Notice: This dictation  was prepared with Dragon dictation along with smaller phrase technology. Any transcriptional errors that result from this process are unintentional and may not be corrected upon review.

## 2023-03-05 ENCOUNTER — Ambulatory Visit: Payer: Medicare HMO | Attending: Adult Health | Admitting: Adult Health

## 2023-03-12 NOTE — Progress Notes (Unsigned)
Palliative Medicine [facility]  [phone] [phone]   Name: Allison [name] Date: 03/12/2023 MRN: [id number]  DOB: September 03, 1950  Patient Care Team: [name], MD as PCP - General (Internal Medicine) Little Ishikawa, MD as PCP - Cardiology (Cardiology) Antony Blackbird, MD as Consulting Physician (Radiation Oncology) Pickenpack-Cousar, Arty Baumgartner, NP as Nurse Practitioner (Nurse Practitioner) Syliva Overman, RN as Oncology Nurse Navigator (Oncology) Si Gaul, MD as Consulting Physician (Oncology) Kermit Balo, DO (Geriatric Medicine)   INTERVAL HISTORY: Allison [name] is a 72 y.o. female with stage IV non-small cell lung cancer (12/2017), hypertension, CVA, CAD, DVT/PE (on Lovenox), and GERD.  Palliative ask to see for symptom management and goals of care.  SOCIAL HISTORY:     reports that she has never smoked. She has never used smokeless tobacco. She reports that she does not currently use alcohol. She reports that she does not use drugs.  ADVANCE DIRECTIVES:  None on file   CODE STATUS: DNR  PAST MEDICAL HISTORY: Past Medical History:  Diagnosis Date   Anemia    Anxiety    Arthritis    Asthma    exercise induced   Coronary artery disease    Depression    PMH   Dyspnea    GERD (gastroesophageal reflux disease)    Glaucoma    History of radiation therapy 01/05/2021   IMRT right lung  11/24/2020-01/05/2021  Dr Antony Blackbird   Hypertension    lung ca dx'd 11/2017   right   Malignant pleural effusion    right   Nuclear sclerotic cataract of right eye 02/28/2021   Dr. Sallye Lat, cataract surgery February 2023   PONV (postoperative nausea and vomiting)    Pre-diabetes    Raynaud's disease    Raynaud's disease    Stroke (HCC) 01/2021   balance off, some express aphasia, weakness    ALLERGIES:  is allergic to hydrocodone-acetaminophen, penicillins, lactose, vicodin [hydrocodone-acetaminophen],  lactase-lactobacillus, and other.  MEDICATIONS:  Current Outpatient Medications  Medication Sig Dispense Refill   acetaminophen (TYLENOL) 500 MG tablet Take 1,000 mg by mouth every 6 (six) hours as needed (pain).     ALPRAZolam (XANAX) 0.25 MG tablet Take 1 tablet (0.25 mg total) by mouth 2 (two) times daily as needed for anxiety. May take 2 tablets (0.5 mg total) at bedtime. 60 tablet 0   amLODipine (NORVASC) 5 MG tablet Take 1 tablet (5 mg total) by mouth daily. 90 tablet 3   carvedilol (COREG) 12.5 MG tablet Take 12.5 mg by mouth 2 (two) times daily with a meal.     CHELATED MAGNESIUM PO Take 200 mg by mouth daily.     Cholecalciferol (VITAMIN D) 125 MCG (5000 UT) CAPS Take 1 Capful by mouth daily.     docusate sodium (COLACE) 100 MG capsule Take 1 capsule (100 mg total) by mouth 2 (two) times daily. 60 capsule 5   dorzolamide-timolol (COSOPT) 22.3-6.8 MG/ML ophthalmic solution Place 1 drop into both eyes 2 (two) times daily.     enoxaparin (LOVENOX) 80 MG/0.8ML injection Inject 80 mg into the skin daily.     ezetimibe (ZETIA) 10 MG tablet Take 10 mg by mouth daily.     FeFum-FePoly-FA-B Cmp-C-Biot (FOLIVANE-PLUS) CAPS TAKE 1 CAPSULE BY MOUTH EVERY DAY IN THE MORNING 90 capsule 0   FLUoxetine (PROZAC) 20 MG capsule Take 1 capsule (20 mg total) by mouth daily. 90 capsule 3   folic acid (FOLVITE) 1  MG tablet TAKE 1 TABLET BY MOUTH EVERY DAY 90 tablet 1   hyoscyamine (LEVSIN SL) 0.125 MG SL tablet Place 1-2 tablets (0.125-0.25 mg total) under the tongue every 6 (six) hours as needed for cramping. 30 tablet 3   ipratropium-albuterol (DUONEB) 0.5-2.5 (3) MG/3ML SOLN Take 3 mLs by nebulization every 6 (six) hours as needed (Asthma).     isosorbide mononitrate (IMDUR) 30 MG 24 hr tablet Take 0.5 tablets (15 mg total) by mouth daily. 90 tablet 3   latanoprost (XALATAN) 0.005 % ophthalmic solution Place 1 drop into both eyes at bedtime.      ondansetron (ZOFRAN-ODT) 4 MG disintegrating tablet Take 1  tablet (4 mg total) by mouth every 8 (eight) hours as needed for nausea or vomiting. 30 tablet 1   osimertinib mesylate (TAGRISSO) 80 MG tablet Take 1 tablet (80 mg total) by mouth daily. 30 tablet 4   Phenylephrine-DM-GG-APAP (MUCINEX FAST-MAX COLD/FLU MS PO) Take 1 Dose by mouth as needed (cough).     prochlorperazine (COMPAZINE) 10 MG tablet Take 1 tablet (10 mg total) by mouth every 6 (six) hours as needed. 30 tablet 2   promethazine (PHENERGAN) 12.5 MG tablet Take 1 tablet (12.5 mg total) by mouth every 6 (six) hours as needed for nausea or vomiting. 30 tablet 2   REPATHA SURECLICK 140 MG/ML SOAJ INJECT 140 MG INTO THE SKIN EVERY 14 (FOURTEEN) DAYS. 6 mL 3   No current facility-administered medications for this visit.    VITAL SIGNS: LMP  (LMP Unknown)  There were no vitals filed for this visit.   Estimated body mass index is 22.42 kg/m as calculated from the following:   Height as of 01/30/23: 5\' 4"  (1.626 m).   Weight as of 01/30/23: 130 lb 9.6 oz (59.2 kg).   PERFORMANCE STATUS (ECOG) : 1 - Symptomatic but completely ambulatory  ASSESSMENT NAD, ambulatory with cane RRR Normal breathing pattern AAO x4  IMPRESSION: Allison [name] presents to clinic for follow-up. No acute distress. Is doing well overall. Denies nausea, vomiting, or diarrhea. Is remaining active. Appetite is good. Occasional fatigue. Denies pain or discomfort.   States she is having some insomnia. She has tried melatonin. Did not sleep well last night. She was previously taking Xanax which was helping. We discussed ways to assist with better sleep pattern.   Some concerns with constipation. She is taking colace twice daily. Education provided on use of Senna-S 2 tablets daily at bedside. She verbalized understanding.   All questions and support provided. Patient knows we are available as needed.    PLAN: Symptoms are well controlled.  Anxiety well controlled. Occasional use of Xanax at bedtime.   Senna-S 2  tablets at bedtime  Patient reports she was recently discharged from [name]'s home palliative program as she no longer met criteria.  I will plan to see back in 6-8 weeks in collaboration with her other oncology appointments.    Patient expressed understanding and was in agreement with this plan. She also understands that She can call the clinic at any time with any questions, concerns, or complaints.     Any controlled substances utilized were prescribed in the context of palliative care. PDMP has been reviewed.    Visit consisted of counseling and education dealing with the complex and emotionally intense issues of symptom management and palliative care in the setting of serious and potentially life-threatening illness.Greater than 50%  of this time was spent counseling and coordinating care related to the above assessment  and plan.  Willette Alma, AGPCNP-BC  Palliative Medicine Team/[facility]  *Please note that this is a verbal dictation therefore any spelling or grammatical errors are due to the "Dragon Medical One" system interpretation.

## 2023-03-13 ENCOUNTER — Inpatient Hospital Stay (HOSPITAL_BASED_OUTPATIENT_CLINIC_OR_DEPARTMENT_OTHER): Payer: Medicare HMO | Admitting: Nurse Practitioner

## 2023-03-13 ENCOUNTER — Inpatient Hospital Stay: Payer: Medicare HMO | Attending: Internal Medicine

## 2023-03-13 VITALS — BP 176/75 | HR 63 | Temp 97.8°F | Resp 13 | Wt 133.0 lb

## 2023-03-13 DIAGNOSIS — G4700 Insomnia, unspecified: Secondary | ICD-10-CM | POA: Diagnosis not present

## 2023-03-13 DIAGNOSIS — G4709 Other insomnia: Secondary | ICD-10-CM | POA: Diagnosis not present

## 2023-03-13 DIAGNOSIS — Z515 Encounter for palliative care: Secondary | ICD-10-CM

## 2023-03-13 DIAGNOSIS — F419 Anxiety disorder, unspecified: Secondary | ICD-10-CM | POA: Insufficient documentation

## 2023-03-13 DIAGNOSIS — R5383 Other fatigue: Secondary | ICD-10-CM | POA: Insufficient documentation

## 2023-03-13 DIAGNOSIS — Z79899 Other long term (current) drug therapy: Secondary | ICD-10-CM | POA: Diagnosis not present

## 2023-03-13 DIAGNOSIS — C3490 Malignant neoplasm of unspecified part of unspecified bronchus or lung: Secondary | ICD-10-CM | POA: Diagnosis present

## 2023-03-13 DIAGNOSIS — C3491 Malignant neoplasm of unspecified part of right bronchus or lung: Secondary | ICD-10-CM

## 2023-03-13 DIAGNOSIS — Z66 Do not resuscitate: Secondary | ICD-10-CM | POA: Insufficient documentation

## 2023-03-13 DIAGNOSIS — K5900 Constipation, unspecified: Secondary | ICD-10-CM

## 2023-03-13 NOTE — Progress Notes (Signed)
[facility] - Clinic Note  03/21/2023     CHIEF COMPLAINT Patient presents for Retina Follow Up   HISTORY OF PRESENT ILLNESS: Allison [name] is a 72 y.o. female who presents to the clinic today for:   HPI     Retina Follow Up   Patient presents with  Other.  In right eye.  This started 6 months ago.  I, the attending physician,  performed the HPI with the patient and updated documentation appropriately.        Comments   Patient here for 6 months retina follow up for ERM OD. Patient states vision no better. Has a red spot on OD. Started last weekend. Was coughing a lot then. Has lung cancer. Finished chemo. Using glaucoma drops.       Last edited by Rennis Chris, MD on 03/23/2023 10:42 PM.    Pt has a red spot in her eye that she wants looked at, she states she coughs a lot and is on blood thinners, she has been off chemo for lung cancer since March, but she goes for scans every 3 months, she states the cancer is still there, but not growing  Referring physician: Sallye Lat, MD [address] [place],  [place] [address]  HISTORICAL INFORMATION:   Selected notes from the MEDICAL RECORD NUMBER Referred by Dr. Dione Booze for second opinion ERM OD LEE:  Ocular Hx- saw Dr. Luciana Axe 2x PMH-    CURRENT MEDICATIONS: Current Outpatient Medications (Ophthalmic Drugs)  Medication Sig   dorzolamide-timolol (COSOPT) 22.3-6.8 MG/ML ophthalmic solution Place 1 drop into both eyes 2 (two) times daily.   latanoprost (XALATAN) 0.005 % ophthalmic solution Place 1 drop into both eyes at bedtime.    No current facility-administered medications for this visit. (Ophthalmic Drugs)   Current Outpatient Medications (Other)  Medication Sig   acetaminophen (TYLENOL) 500 MG tablet Take 1,000 mg by mouth every 6 (six) hours as needed (pain).   ALPRAZolam (XANAX) 0.25 MG tablet Take 1 tablet (0.25 mg total) by mouth 2 (two) times daily as needed for anxiety. May take  2 tablets (0.5 mg total) at bedtime.   amLODipine (NORVASC) 5 MG tablet Take 1 tablet (5 mg total) by mouth daily.   carvedilol (COREG) 12.5 MG tablet Take 12.5 mg by mouth 2 (two) times daily with a meal.   CHELATED MAGNESIUM PO Take 200 mg by mouth daily.   Cholecalciferol (VITAMIN D) 125 MCG (5000 UT) CAPS Take 1 Capful by mouth daily.   docusate sodium (COLACE) 100 MG capsule Take 1 capsule (100 mg total) by mouth 2 (two) times daily.   enoxaparin (LOVENOX) 80 MG/0.8ML injection Inject 80 mg into the skin daily.   ezetimibe (ZETIA) 10 MG tablet Take 10 mg by mouth daily.   FeFum-FePoly-FA-B Cmp-C-Biot (FOLIVANE-PLUS) CAPS TAKE 1 CAPSULE BY MOUTH EVERY DAY IN THE MORNING   FLUoxetine (PROZAC) 20 MG capsule Take 1 capsule (20 mg total) by mouth daily.   folic acid (FOLVITE) 1 MG tablet TAKE 1 TABLET BY MOUTH EVERY DAY   hyoscyamine (LEVSIN SL) 0.125 MG SL tablet Place 1-2 tablets (0.125-0.25 mg total) under the tongue every 6 (six) hours as needed for cramping.   ipratropium-albuterol (DUONEB) 0.5-2.5 (3) MG/3ML SOLN Take 3 mLs by nebulization every 6 (six) hours as needed (Asthma).   isosorbide mononitrate (IMDUR) 30 MG 24 hr tablet Take 0.5 tablets (15 mg total) by mouth daily.   ondansetron (ZOFRAN-ODT) 4 MG disintegrating  tablet Take 1 tablet (4 mg total) by mouth every 8 (eight) hours as needed for nausea or vomiting.   osimertinib mesylate (TAGRISSO) 80 MG tablet Take 1 tablet (80 mg total) by mouth daily.   REPATHA SURECLICK 140 MG/ML SOAJ INJECT 140 MG INTO THE SKIN EVERY 14 (FOURTEEN) DAYS.   Phenylephrine-DM-GG-APAP (MUCINEX FAST-MAX COLD/FLU MS PO) Take 1 Dose by mouth as needed (cough). (Patient not taking: Reported on 03/21/2023)   prochlorperazine (COMPAZINE) 10 MG tablet Take 1 tablet (10 mg total) by mouth every 6 (six) hours as needed. (Patient not taking: Reported on 03/21/2023)   promethazine (PHENERGAN) 12.5 MG tablet Take 1 tablet (12.5 mg total) by mouth every 6 (six) hours  as needed for nausea or vomiting. (Patient not taking: Reported on 03/21/2023)   No current facility-administered medications for this visit. (Other)   REVIEW OF SYSTEMS: ROS   Positive for: Constitutional, Neurological, Eyes, Respiratory, Heme/Lymph Last edited by Laddie Aquas, COA on 03/21/2023  9:24 AM.     ALLERGIES Allergies  Allergen Reactions   Hydrocodone-Acetaminophen Other (See Comments)   Penicillins Other (See Comments)    SYNCOPE PATIENT HAS HAD A PCN REACTION WITH IMMEDIATE RASH, FACIAL/TONGUE/THROAT SWELLING, SOB, OR LIGHTHEADEDNESS WITH HYPOTENSION:  #  #  YES  #  # Has patient had a PCN reaction causing severe rash involving mucus membranes or skin necrosis: No Has patient had a PCN reaction that required hospitalization: No Has patient had a PCN reaction occurring within the last 10 years: No  Other reaction(s): other   Lactose Other (See Comments)    Allergy to all dairy products Abdominal cramping   Vicodin [Hydrocodone-Acetaminophen] Other (See Comments)    Sped up heart and breathing   Lactase-Lactobacillus Nausea And Vomiting   Other Other (See Comments)    Glaucoma eye drop - turned eyes dark   PAST MEDICAL HISTORY Past Medical History:  Diagnosis Date   Anemia    Anxiety    Arthritis    Asthma    exercise induced   Coronary artery disease    Depression    PMH   Dyspnea    GERD (gastroesophageal reflux disease)    Glaucoma    History of radiation therapy 01/05/2021   IMRT right lung  11/24/2020-01/05/2021  Dr Antony Blackbird   Hypertension    lung ca dx'd 11/2017   right   Malignant pleural effusion    right   Nuclear sclerotic cataract of right eye 02/28/2021   Dr. Sallye Lat, cataract surgery February 2023   PONV (postoperative nausea and vomiting)    Pre-diabetes    Raynaud's disease    Raynaud's disease    Stroke (HCC) 01/2021   balance off, some express aphasia, weakness   Past Surgical History:  Procedure Laterality Date    ABDOMINAL HYSTERECTOMY     partial   BRONCHIAL BIOPSY  04/21/2021   Procedure: BRONCHIAL BIOPSIES;  Surgeon: Josephine Igo, DO;  Location: [facility];  Service: Pulmonary;;   BRONCHIAL BRUSHINGS  04/21/2021   Procedure: BRONCHIAL BRUSHINGS;  Surgeon: Josephine Igo, DO;  Location: [facility];  Service: Pulmonary;;   BRONCHIAL NEEDLE ASPIRATION BIOPSY  04/21/2021   Procedure: BRONCHIAL NEEDLE ASPIRATION BIOPSIES;  Surgeon: Josephine Igo, DO;  Location: [facility];  Service: Pulmonary;;   CHEST TUBE INSERTION Right 01/01/2018   Procedure: INSERTION PLEURAL DRAINAGE CATHETER;  Surgeon: Kerin Perna, MD;  Location: [facility] OR;  Service: Thoracic;  Laterality: Right;   CHEST TUBE INSERTION  04/21/2021   Procedure: CHEST TUBE INSERTION;  Surgeon: Josephine Igo, DO;  Location: [facility];  Service: Pulmonary;;   COLONOSCOPY     CORONARY STENT INTERVENTION N/A 06/16/2019   Procedure: CORONARY STENT INTERVENTION;  Surgeon: Corky Crafts, MD;  Location: [facility] INVASIVE CV LAB;  Service: Cardiovascular;  Laterality: N/A;   DILATION AND CURETTAGE OF UTERUS     EYE SURGERY     due to Glaucoma   IR IMAGING GUIDED PORT INSERTION  03/22/2021   IR PORT REPAIR CENTRAL VENOUS ACCESS DEVICE  04/15/2021   LEFT HEART CATH AND CORONARY ANGIOGRAPHY N/A 06/16/2019   Procedure: LEFT HEART CATH AND CORONARY ANGIOGRAPHY;  Surgeon: Corky Crafts, MD;  Location: [facility] INVASIVE CV LAB;  Service: Cardiovascular;  Laterality: N/A;   REMOVAL OF PLEURAL DRAINAGE CATHETER Right 11/07/2018   Procedure: REMOVAL OF PLEURAL DRAINAGE CATHETER;  Surgeon: Kerin Perna, MD;  Location: [facility] OR;  Service: Thoracic;  Laterality: Right;   ROTATOR CUFF REPAIR     TUBAL LIGATION     VIDEO BRONCHOSCOPY WITH ENDOBRONCHIAL NAVIGATION Bilateral 04/21/2021   Procedure: VIDEO BRONCHOSCOPY WITH ENDOBRONCHIAL NAVIGATION;  Surgeon: Josephine Igo, DO;  Location: [facility];  Service: Pulmonary;  Laterality: Bilateral;   ION   WISDOM TOOTH EXTRACTION     FAMILY HISTORY Family History  Problem Relation Age of Onset   Heart disease Sister    Heart disease Brother    Lung cancer Other    SOCIAL HISTORY Social History   Tobacco Use   Smoking status: Never   Smokeless tobacco: Never  Vaping Use   Vaping status: Never Used  Substance Use Topics   Alcohol use: Not Currently    Comment: up to 3 drinks per week   Drug use: No    Comment: CBD oil        OPHTHALMIC EXAM:  Base Eye Exam     Visual Acuity (Snellen - Linear)       Right Left   Dist cc 20/60 -1 20/20   Dist ph cc NI     Correction: Glasses         Tonometry (Tonopen, 9:22 AM)       Right Left   Pressure 13 13         Pupils       Dark Light Shape React APD   Right 4 3 Round Brisk None   Left 4 3 Round Brisk None         Visual Fields (Counting fingers)       Left Right    Full Full         Extraocular Movement       Right Left    Full, Ortho Full, Ortho         Neuro/Psych     Oriented x3: Yes   Mood/Affect: Normal         Dilation     Both eyes: 1.0% Mydriacyl, 2.5% Phenylephrine @ 9:22 AM           Slit Lamp and Fundus Exam     Slit Lamp Exam       Right Left   Lids/Lashes Dermatochalasis - upper lid Dermatochalasis - upper lid   Conjunctiva/Sclera mild melanosis, mild Subconjunctival hemorrhage mild melanosis   Cornea well healed cataract wound, trace PEE trace PEE, well healed cataract wound   Anterior Chamber deep and clear deep and clear   Iris Round and dilated Round and dilated  Lens PC IOL in good position, trace Posterior capsular opacification Posterior chamber intraocular lens, trace Posterior capsular opacification   Anterior Vitreous Vitreous syneresis, Posterior vitreous detachment Vitreous syneresis         Fundus Exam       Right Left   Disc mild Pallor, Sharp rim, +cupping, thin superior rim, mild PPA Pink and Sharp, +cupping, +PPP   C/D Ratio 0.85 0.7    Macula Flat, Blunted foveal reflex, ERM with striae and early fibrosis, no heme Flat, Good foveal reflex, RPE mottling and clumping, No heme or edema   Vessels attenuated, Tortuous mild attenuation, mild tortuosity, mild copper wiring   Periphery Attached, No heme Attached, No heme            IMAGING AND PROCEDURES  Imaging and Procedures for 03/21/2023  OCT, Retina - OU - Both Eyes       Right Eye Quality was good. Scan locations included subfoveal. Central Foveal Thickness: 579. Progression has been stable. Findings include no SRF, abnormal foveal contour, epiretinal membrane, intraretinal fluid, macular pucker, preretinal fibrosis (ERM with with pucker and mild interval improvement in cystic changes).   Left Eye Quality was good. Scan locations included subfoveal. Central Foveal Thickness: 230. Progression has been stable. Findings include normal foveal contour, no IRF, no SRF (Trace ERM).   Notes *Images captured and stored on drive  Diagnosis / Impression:  OD: ERM with pucker and mild interval improvement in cystic changes OS: Trace ERM  Clinical management:  See below  Abbreviations: NFP - Normal foveal profile. CME - cystoid macular edema. PED - pigment epithelial detachment. IRF - intraretinal fluid. SRF - subretinal fluid. EZ - ellipsoid zone. ERM - epiretinal membrane. ORA - outer retinal atrophy. ORT - outer retinal tubulation. SRHM - subretinal hyper-reflective material. IRHM - intraretinal hyper-reflective material            ASSESSMENT/PLAN:    ICD-10-CM   1. Epiretinal membrane, right eye  H35.371 OCT, Retina - OU - Both Eyes    2. Essential hypertension  I10     3. Hypertensive retinopathy of both eyes  H35.033     4. Combined forms of age-related cataract of left eye  H25.812     5. Pseudophakia  Z96.1     6. Bilateral ocular hypertension  H40.053      Epiretinal membrane, right eye  - stable - OCT shows ERM with pucker and mild interval  decrease in cystic changes  - BCVA OD 20/60 from 20/70 - +metamorphopsia - discussed treatment options limited to surgery or no surgery -- no medications, drops, diet, or lifestyle changes to treat ERM - RBA of ERM surgery discussed, questions answered - pt wishes to continue to monitor - f/u 6 mos, sooner prn -- DFE/OCT  2,3. Hypertensive retinopathy OU - discussed importance of tight BP control - monitor  4. Mixed Cataract OS - The symptoms of cataract, surgical options, and treatments and risks were discussed with patient. - discussed diagnosis and progression - BCVA 20/20 from 20/30 - under the expert management of Dr. Zetta Bills  5. Pseudophakia OD  - s/p CE/IOL OD (Dr. Zetta Bills)  - IOL in good position, doing well  - monitor  6. Ocular Hypertension  - IOP today: 13 OU  - on latanoprost QHS OU and Cosopt BID OU  - managed by Dr. Zetta Bills  Ophthalmic Meds Ordered this visit:  No orders of the defined types were placed in this encounter.  Return in about 6 months (around 09/19/2023) for f/u ERM OD, DFE, OCT.  There are no Patient Instructions on file for this visit.   Explained the diagnoses, plan, and follow up with the patient and they expressed understanding.  Patient expressed understanding of the importance of proper follow up care.   This document serves as a record of services personally performed by Karie Chimera, MD, PhD. It was created on their behalf by De Blanch, an ophthalmic technician. The creation of this record is the provider's dictation and/or activities during the visit.    Electronically signed by: De Blanch, OA, 03/23/23  10:49 PM  This document serves as a record of services personally performed by Karie Chimera, MD, PhD. It was created on their behalf by Glee Arvin. Manson Passey, OA an ophthalmic technician. The creation of this record is the provider's dictation and/or activities during the visit.    Electronically signed by: Glee Arvin.  Manson Passey, OA 03/23/23 10:49 PM  Karie Chimera, M.D., Ph.D. Diseases & Surgery of the Retina and Vitreous [facility]  I have reviewed the above documentation for accuracy and completeness, and I agree with the above. Karie Chimera, M.D., Ph.D. 03/23/23 10:57 PM   Abbreviations: M myopia (nearsighted); A astigmatism; H hyperopia (farsighted); P presbyopia; Mrx spectacle prescription;  CTL contact lenses; OD right eye; OS left eye; OU both eyes  XT exotropia; ET esotropia; PEK punctate epithelial keratitis; PEE punctate epithelial erosions; DES dry eye syndrome; MGD meibomian gland dysfunction; ATs artificial tears; PFAT's preservative free artificial tears; NSC nuclear sclerotic cataract; PSC posterior subcapsular cataract; ERM epi-retinal membrane; PVD posterior vitreous detachment; RD retinal detachment; DM diabetes mellitus; DR diabetic retinopathy; NPDR non-proliferative diabetic retinopathy; PDR proliferative diabetic retinopathy; CSME clinically significant macular edema; DME diabetic macular edema; dbh dot blot hemorrhages; CWS cotton wool spot; POAG primary open angle glaucoma; C/D cup-to-disc ratio; HVF humphrey visual field; GVF goldmann visual field; OCT optical coherence tomography; IOP intraocular pressure; BRVO Branch retinal vein occlusion; CRVO central retinal vein occlusion; CRAO central retinal artery occlusion; BRAO branch retinal artery occlusion; RT retinal tear; SB scleral buckle; PPV pars plana vitrectomy; VH Vitreous hemorrhage; PRP panretinal laser photocoagulation; IVK intravitreal kenalog; VMT vitreomacular traction; MH Macular hole;  NVD neovascularization of the disc; NVE neovascularization elsewhere; AREDS age related eye disease study; ARMD age related macular degeneration; POAG primary open angle glaucoma; EBMD epithelial/anterior basement membrane dystrophy; ACIOL anterior chamber intraocular lens; IOL intraocular lens; PCIOL posterior chamber  intraocular lens; Phaco/IOL phacoemulsification with intraocular lens placement; PRK photorefractive keratectomy; LASIK laser assisted in situ keratomileusis; HTN hypertension; DM diabetes mellitus; COPD chronic obstructive pulmonary disease

## 2023-03-14 ENCOUNTER — Encounter: Payer: Self-pay | Admitting: Internal Medicine

## 2023-03-14 ENCOUNTER — Encounter: Payer: Self-pay | Admitting: Physician Assistant

## 2023-03-21 ENCOUNTER — Encounter (INDEPENDENT_AMBULATORY_CARE_PROVIDER_SITE_OTHER): Payer: Self-pay | Admitting: Ophthalmology

## 2023-03-21 ENCOUNTER — Ambulatory Visit (INDEPENDENT_AMBULATORY_CARE_PROVIDER_SITE_OTHER): Payer: Medicare HMO | Admitting: Ophthalmology

## 2023-03-21 DIAGNOSIS — H25812 Combined forms of age-related cataract, left eye: Secondary | ICD-10-CM

## 2023-03-21 DIAGNOSIS — I10 Essential (primary) hypertension: Secondary | ICD-10-CM | POA: Diagnosis not present

## 2023-03-21 DIAGNOSIS — H35033 Hypertensive retinopathy, bilateral: Secondary | ICD-10-CM

## 2023-03-21 DIAGNOSIS — Z961 Presence of intraocular lens: Secondary | ICD-10-CM

## 2023-03-21 DIAGNOSIS — H35371 Puckering of macula, right eye: Secondary | ICD-10-CM

## 2023-03-21 DIAGNOSIS — H40053 Ocular hypertension, bilateral: Secondary | ICD-10-CM

## 2023-03-23 ENCOUNTER — Encounter (INDEPENDENT_AMBULATORY_CARE_PROVIDER_SITE_OTHER): Payer: Self-pay | Admitting: Ophthalmology

## 2023-04-05 ENCOUNTER — Telehealth: Payer: Self-pay | Admitting: Pharmacy Technician

## 2023-04-05 NOTE — Telephone Encounter (Signed)
Oral Oncology Patient Advocate Encounter   Received notification that patient is due for re-enrollment for assistance for Tagrisso through [employer].   Re-enrollment process has been initiated and will be submitted upon completion of necessary documents.  Patient to sign 04/25/23  [employer] phone number [phone].   I will continue to follow until final determination.  Jinger Neighbors, CPhT-Adv Oncology Pharmacy Patient Advocate [facility] Direct Number: [phone]  Fax: [phone]

## 2023-04-15 ENCOUNTER — Other Ambulatory Visit: Payer: Self-pay | Admitting: Physician Assistant

## 2023-04-15 DIAGNOSIS — C3491 Malignant neoplasm of unspecified part of right bronchus or lung: Secondary | ICD-10-CM

## 2023-04-15 NOTE — Telephone Encounter (Signed)
Not needed

## 2023-04-24 IMAGING — CT CT CHEST W/ CM
2 of 5 series · 12 of 36 positions shown, 15 images · IV contrast (OMNIPAQUE)
Comparison: PET-CT, 11/09/2020

CLINICAL DATA: Non-small cell lung cancer staging, ongoing
[place]

EXAM:
CT CHEST, ABDOMEN, AND PELVIS WITH CONTRAST
TECHNIQUE: Multidetector CT imaging of the chest, abdomen and pelvis was
performed following the standard protocol during bolus
administration of intravenous contrast.
CONTRAST:  80mL OMNIPAQUE IOHEXOL 350 MG/ML SOLN, additional oral
enteric contrast

[Series 2: cap with · axial · 0.70mm/px · z∈[-576,-81]mm · 9 of 121 slices shown, 12 images]
[im 11/121  mediastinal]
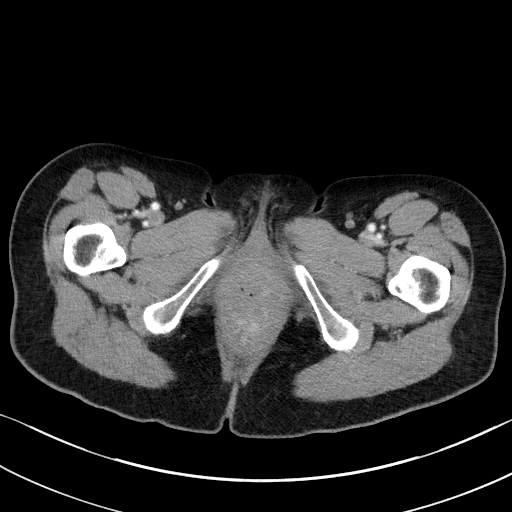
[im 11/121  lung]
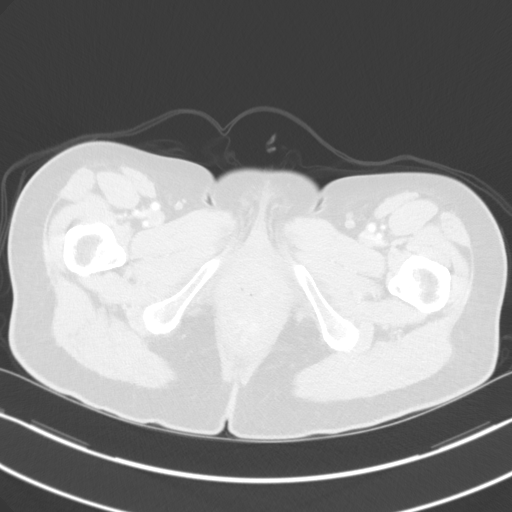
[im 22/121  lung]
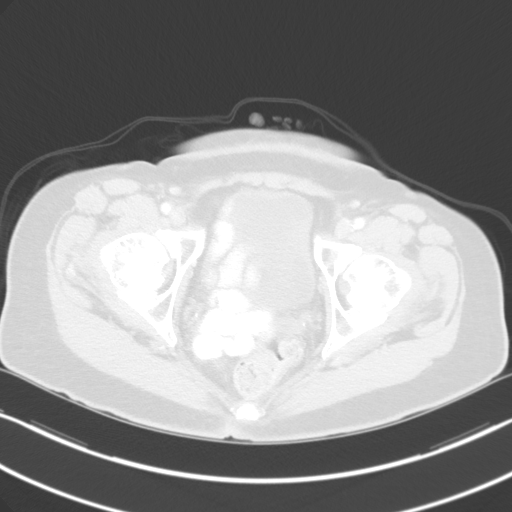
[im 33/121  lung]
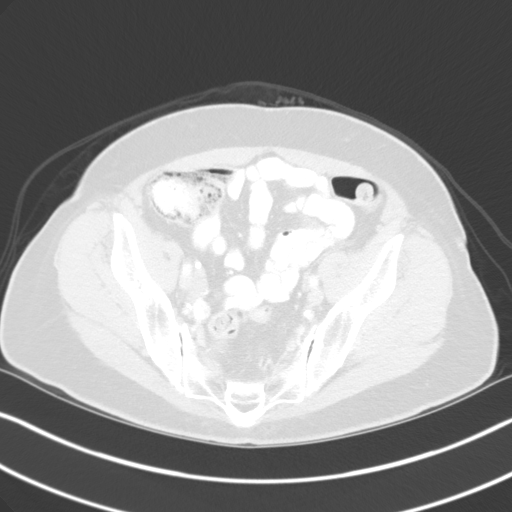
[im 44/121  lung]
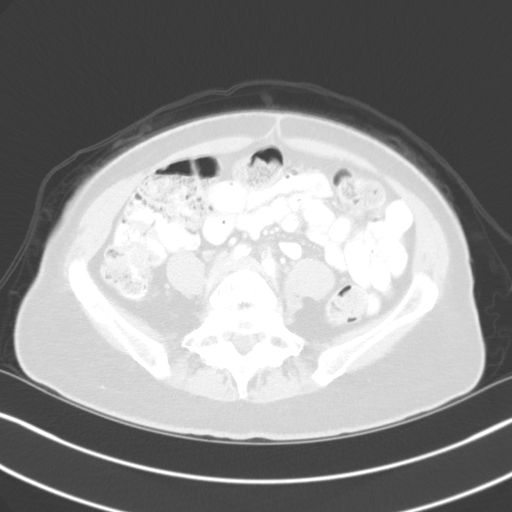
[im 66/121  mediastinal]
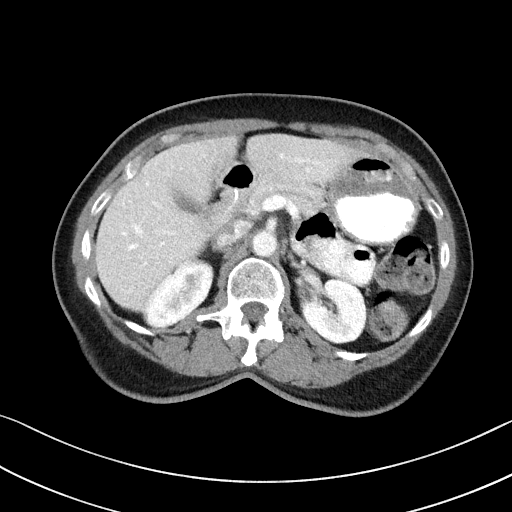
[im 66/121  lung]
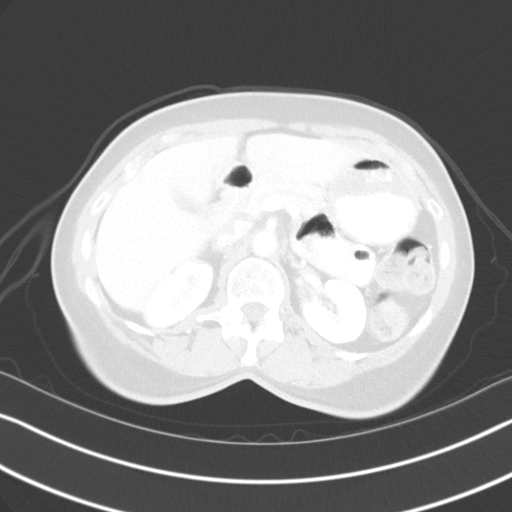
[im 77/121  lung]
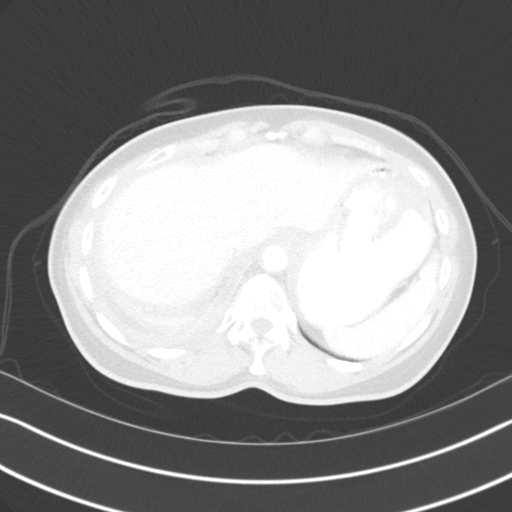
[im 88/121  lung]
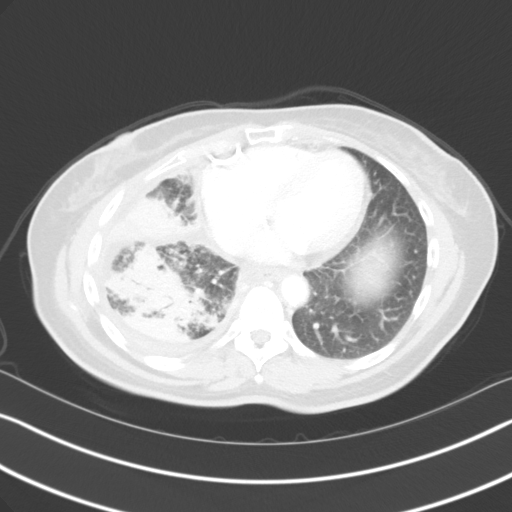
[im 99/121  lung]
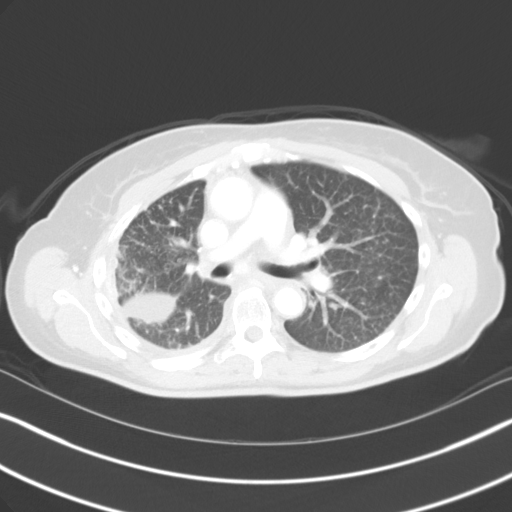
[im 110/121  mediastinal]
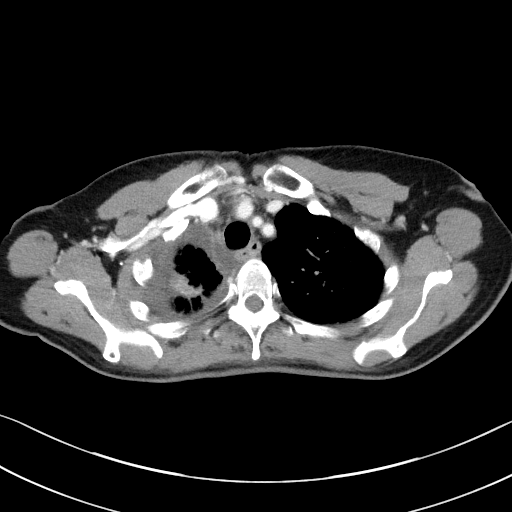
[im 110/121  lung]
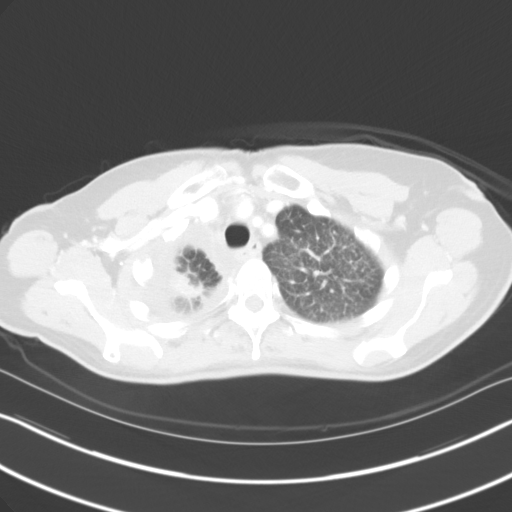

[Series 4: coronals · coronal · 0.67mm/px · 3 of 123 slices shown]
[im 25/123  lung]
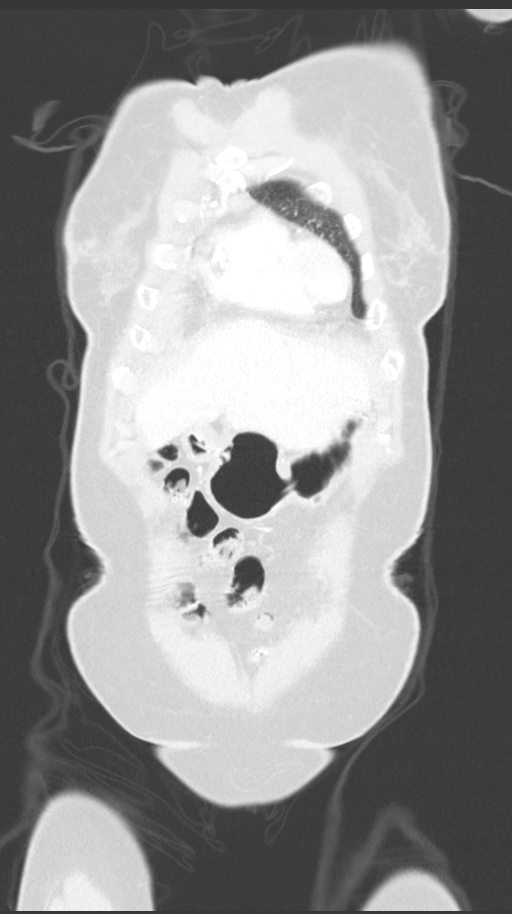
[im 49/123  lung]
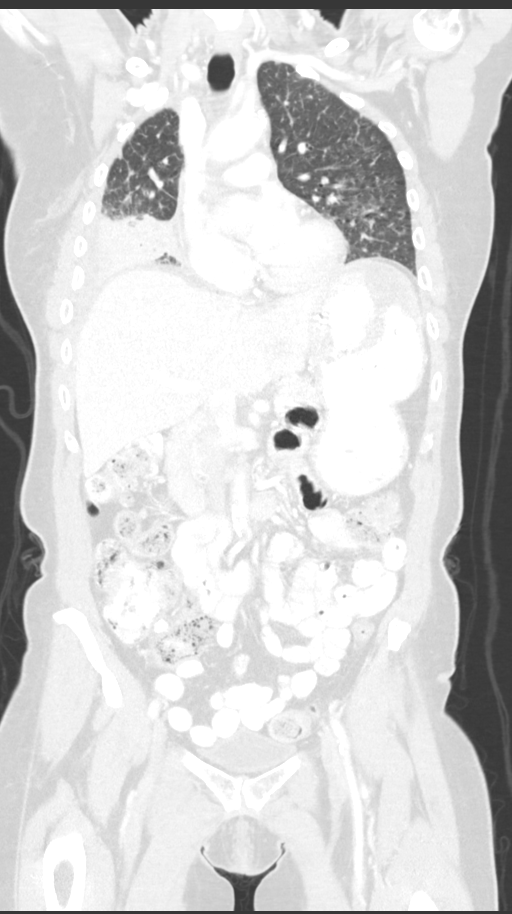
[im 74/123  lung]
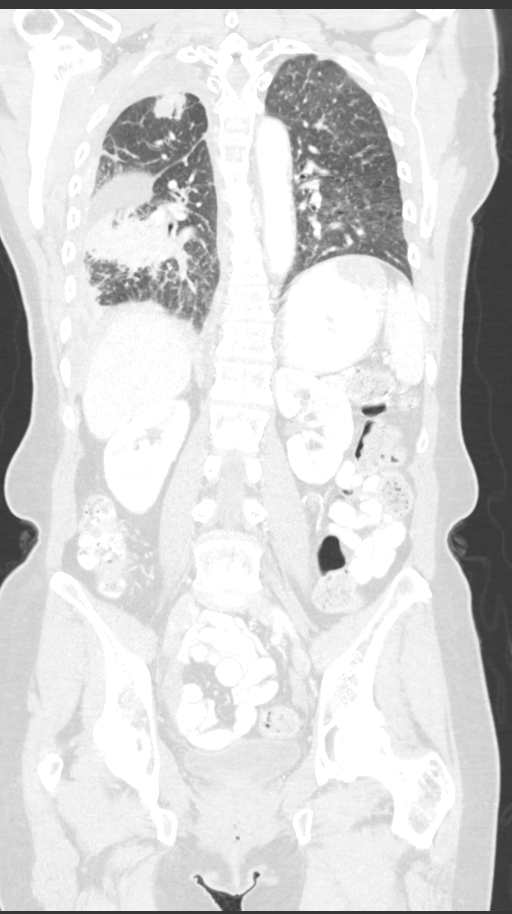

[12 of 36 positions shown; findings below may reference images not displayed]

FINDINGS: CT CHEST FINDINGS

Cardiovascular: Aortic atherosclerosis. Normal heart size. Right
coronary artery calcifications and stents. No pericardial effusion.

Mediastinum/Nodes: Slight interval decrease in size of prominent,
although no longer pathologically enlarged mediastinal lymph nodes,
index pretracheal node measuring 0.9 x 0.7 cm, previously 1.3 x
cm (series 2, image 20). Thyroid gland, trachea, and esophagus
demonstrate no significant findings.

Lungs/Pleura: Interval decrease in size of a mass of the right
pulmonary apex, measuring 2.8 x 1.8 cm, previously 4.4 x 3.5 cm.
Interval increase of a small right pleural effusion, now with clear
evidence of lymphangitic carcinomatosis involving both lungs,, right
greater than left, with diffuse septal and pleural thickening and
nodularity, particularly about the right lung base (series 2, image
41). There are areas of significant consolidation in right middle
and right lower lobes (series 6, image 80). No pleural effusion or
pneumothorax.

Musculoskeletal: No chest wall mass or suspicious bone lesions
identified.

CT ABDOMEN PELVIS FINDINGS

Hepatobiliary: No solid liver abnormality is seen. No gallstones,
gallbladder wall thickening, or biliary dilatation.

Pancreas: Unremarkable. No pancreatic ductal dilatation or
surrounding inflammatory changes.

Spleen: Normal in size without significant abnormality.

Adrenals/Urinary Tract: Adrenal glands are unremarkable. Kidneys are
normal, without renal calculi, solid lesion, or hydronephrosis.
Bladder is unremarkable.

Stomach/Bowel: Stomach is within normal limits. Appendix is not
clearly visualized and may be surgically absent. No evidence of
bowel wall thickening, distention, or inflammatory changes.

Vascular/Lymphatic: Aortic atherosclerosis. No enlarged abdominal or
pelvic lymph nodes.

Reproductive: Status post hysterectomy.

Other: No abdominal wall hernia or abnormality. No abdominopelvic
ascites.

Musculoskeletal: No acute or significant osseous findings.
IMPRESSION: 1. Interval decrease in size of a mass of the right pulmonary apex.
2. Slight interval decrease in size of prominent, previously FDG
avid mediastinal lymph nodes.
3. Interval increase of a small right pleural effusion, now with
clear evidence of lymphangitic carcinomatosis involving both lungs,
right greater than left, with diffuse septal and pleural thickening
and nodularity, particularly about the right lung base.
4. There are areas of significant consolidation in right middle and
right lower lobes, which may reflect confluent metastatic disease,
or alternately infectious, inflammatory, or postobstructive airspace
disease.
5. Overall constellation of findings is consistent with mixed
response to treatment.
6. No evidence of metastatic disease in the abdomen or pelvis.
7. Coronary artery disease.

Aortic Atherosclerosis ([id number].T).

## 2023-04-25 ENCOUNTER — Other Ambulatory Visit: Payer: Self-pay | Admitting: Internal Medicine

## 2023-04-25 ENCOUNTER — Inpatient Hospital Stay: Payer: Medicare HMO | Attending: Internal Medicine

## 2023-04-25 ENCOUNTER — Ambulatory Visit (HOSPITAL_COMMUNITY)
Admission: RE | Admit: 2023-04-25 | Discharge: 2023-04-25 | Disposition: A | Discharge status: Home / Self Care | Payer: Medicare HMO | Source: Ambulatory Visit | Attending: Internal Medicine | Admitting: Internal Medicine

## 2023-04-25 DIAGNOSIS — C3490 Malignant neoplasm of unspecified part of unspecified bronchus or lung: Secondary | ICD-10-CM

## 2023-04-25 DIAGNOSIS — Z79899 Other long term (current) drug therapy: Secondary | ICD-10-CM | POA: Diagnosis not present

## 2023-04-25 DIAGNOSIS — Z923 Personal history of irradiation: Secondary | ICD-10-CM | POA: Insufficient documentation

## 2023-04-25 DIAGNOSIS — Z23 Encounter for immunization: Secondary | ICD-10-CM | POA: Insufficient documentation

## 2023-04-25 DIAGNOSIS — C3411 Malignant neoplasm of upper lobe, right bronchus or lung: Secondary | ICD-10-CM | POA: Insufficient documentation

## 2023-04-25 DIAGNOSIS — Z95828 Presence of other vascular implants and grafts: Secondary | ICD-10-CM

## 2023-04-25 LAB — CBC WITH DIFFERENTIAL (CANCER CENTER ONLY)
Abs Immature Granulocytes: 0.01 10*3/uL (ref 0.00–0.07)
Basophils Absolute: 0 10*3/uL (ref 0.0–0.1)
Basophils Relative: 1 %
Eosinophils Absolute: 0.1 10*3/uL (ref 0.0–0.5)
Eosinophils Relative: 2 %
HCT: 36.9 % (ref 36.0–46.0)
Hemoglobin: 12 g/dL (ref 12.0–15.0)
Immature Granulocytes: 0 %
Lymphocytes Relative: 35 %
Lymphs Abs: 1.2 10*3/uL (ref 0.7–4.0)
MCH: 27.3 pg (ref 26.0–34.0)
MCHC: 32.5 g/dL (ref 30.0–36.0)
MCV: 83.9 fL (ref 80.0–100.0)
Monocytes Absolute: 0.4 10*3/uL (ref 0.1–1.0)
Monocytes Relative: 10 %
Neutro Abs: 1.9 10*3/uL (ref 1.7–7.7)
Neutrophils Relative %: 52 %
Platelet Count: 145 10*3/uL — ABNORMAL LOW (ref 150–400)
RBC: 4.4 MIL/uL (ref 3.87–5.11)
RDW: 14.4 % (ref 11.5–15.5)
WBC Count: 3.6 10*3/uL — ABNORMAL LOW (ref 4.0–10.5)
nRBC: 0 % (ref 0.0–0.2)

## 2023-04-25 LAB — CMP (CANCER CENTER ONLY)
ALT: 29 U/L (ref 0–44)
AST: 28 U/L (ref 15–41)
Albumin: 4 g/dL (ref 3.5–5.0)
Alkaline Phosphatase: 75 U/L (ref 38–126)
Anion gap: 6 (ref 5–15)
BUN: 18 mg/dL (ref 8–23)
CO2: 27 mmol/L (ref 22–32)
Calcium: 9.4 mg/dL (ref 8.9–10.3)
Chloride: 105 mmol/L (ref 98–111)
Creatinine: 1.23 mg/dL — ABNORMAL HIGH (ref 0.44–1.00)
GFR, Estimated: 47 mL/min — ABNORMAL LOW (ref >60)
Glucose, Bld: 91 mg/dL (ref 70–99)
Potassium: 3.8 mmol/L (ref 3.5–5.1)
Sodium: 138 mmol/L (ref 135–145)
Total Bilirubin: 0.4 mg/dL (ref <1.2)
Total Protein: 7 g/dL (ref 6.5–8.1)

## 2023-04-25 MED ORDER — SODIUM CHLORIDE 0.9% FLUSH
10.0000 mL | Freq: Once | INTRAVENOUS | Status: AC
  Administered 2023-04-25: 10 mL

## 2023-04-25 MED ORDER — HEPARIN SOD (PORK) LOCK FLUSH 100 UNIT/ML IV SOLN
500.0000 [IU] | Freq: Once | INTRAVENOUS | Status: AC
  Administered 2023-04-25: 500 [IU] via INTRAVENOUS

## 2023-04-25 MED ORDER — IOHEXOL 300 MG/ML  SOLN
100.0000 mL | Freq: Once | INTRAMUSCULAR | Status: AC | PRN
  Administered 2023-04-25: 100 mL via INTRAVENOUS

## 2023-04-25 MED ORDER — HEPARIN SOD (PORK) LOCK FLUSH 100 UNIT/ML IV SOLN
INTRAVENOUS | Status: AC
  Filled 2023-04-25: qty 5

## 2023-04-25 NOTE — Telephone Encounter (Signed)
Oral Oncology Patient Advocate Encounter   Submitted application for assistance for Tagrisso to [employer].   Application submitted via e-fax to [phone]   [employer] phone number [phone].   I will continue to check the status until final determination.   Lady Deutscher, CPhT-Adv Oncology Pharmacy Patient [place] Direct Number: [phone]  Fax: [phone]

## 2023-04-30 NOTE — Progress Notes (Unsigned)
Palliative Medicine [facility]  [phone] [phone]) [phone]   Name: Allison [name] Date: 04/30/2023 MRN: [id number]  DOB: Jul 25, 1950  Patient Care Team: [name], MD as PCP - General (Internal Medicine) Little Ishikawa, MD as PCP - Cardiology (Cardiology) Antony Blackbird, MD as Consulting Physician (Radiation Oncology) Pickenpack-Cousar, Arty Baumgartner, NP as Nurse Practitioner (Nurse Practitioner) Syliva Overman, RN as Oncology Nurse Navigator (Oncology) Si Gaul, MD as Consulting Physician (Oncology) Kermit Balo, DO (Geriatric Medicine)   INTERVAL HISTORY: Allison [name] is a 72 y.o. female with stage IV non-small cell lung cancer (12/2017), hypertension, CVA, CAD, DVT/PE (on Lovenox), and GERD.  Palliative ask to see for symptom management and goals of care.  SOCIAL HISTORY:     reports that she has never smoked. She has never used smokeless tobacco. She reports that she does not currently use alcohol. She reports that she does not use drugs.  ADVANCE DIRECTIVES:  None on file   CODE STATUS: DNR  PAST MEDICAL HISTORY: Past Medical History:  Diagnosis Date   Anemia    Anxiety    Arthritis    Asthma    exercise induced   Coronary artery disease    Depression    PMH   Dyspnea    GERD (gastroesophageal reflux disease)    Glaucoma    History of radiation therapy 01/05/2021   IMRT right lung  11/24/2020-01/05/2021  Dr Antony Blackbird   Hypertension    lung ca dx'd 11/2017   right   Malignant pleural effusion    right   Nuclear sclerotic cataract of right eye 02/28/2021   Dr. Sallye Lat, cataract surgery February 2023   PONV (postoperative nausea and vomiting)    Pre-diabetes    Raynaud's disease    Raynaud's disease    Stroke (HCC) 01/2021   balance off, some express aphasia, weakness    ALLERGIES:  is allergic to hydrocodone-acetaminophen, penicillins, lactose, vicodin [hydrocodone-acetaminophen],  lactase-lactobacillus, and other.  MEDICATIONS:  Current Outpatient Medications  Medication Sig Dispense Refill   acetaminophen (TYLENOL) 500 MG tablet Take 1,000 mg by mouth every 6 (six) hours as needed (pain).     ALPRAZolam (XANAX) 0.25 MG tablet Take 1 tablet (0.25 mg total) by mouth 2 (two) times daily as needed for anxiety. May take 2 tablets (0.5 mg total) at bedtime. 60 tablet 0   amLODipine (NORVASC) 5 MG tablet Take 1 tablet (5 mg total) by mouth daily. 90 tablet 3   carvedilol (COREG) 12.5 MG tablet Take 12.5 mg by mouth 2 (two) times daily with a meal.     CHELATED MAGNESIUM PO Take 200 mg by mouth daily.     Cholecalciferol (VITAMIN D) 125 MCG (5000 UT) CAPS Take 1 Capful by mouth daily.     docusate sodium (COLACE) 100 MG capsule Take 1 capsule (100 mg total) by mouth 2 (two) times daily. 60 capsule 5   dorzolamide-timolol (COSOPT) 22.3-6.8 MG/ML ophthalmic solution Place 1 drop into both eyes 2 (two) times daily.     enoxaparin (LOVENOX) 80 MG/0.8ML injection Inject 80 mg into the skin daily.     ezetimibe (ZETIA) 10 MG tablet Take 10 mg by mouth daily.     FeFum-FePoly-FA-B Cmp-C-Biot (FOLIVANE-PLUS) CAPS TAKE 1 CAPSULE BY MOUTH EVERY DAY IN THE MORNING 90 capsule 0   FLUoxetine (PROZAC) 20 MG capsule Take 1 capsule (20 mg total) by mouth daily. 90 capsule 3   folic acid (FOLVITE) 1  MG tablet TAKE 1 TABLET BY MOUTH EVERY DAY 90 tablet 1   hyoscyamine (LEVSIN SL) 0.125 MG SL tablet Place 1-2 tablets (0.125-0.25 mg total) under the tongue every 6 (six) hours as needed for cramping. 30 tablet 3   ipratropium-albuterol (DUONEB) 0.5-2.5 (3) MG/3ML SOLN Take 3 mLs by nebulization every 6 (six) hours as needed (Asthma).     isosorbide mononitrate (IMDUR) 30 MG 24 hr tablet Take 0.5 tablets (15 mg total) by mouth daily. 90 tablet 3   latanoprost (XALATAN) 0.005 % ophthalmic solution Place 1 drop into both eyes at bedtime.      ondansetron (ZOFRAN-ODT) 4 MG disintegrating tablet Take 1  tablet (4 mg total) by mouth every 8 (eight) hours as needed for nausea or vomiting. 30 tablet 1   osimertinib mesylate (TAGRISSO) 80 MG tablet Take 1 tablet (80 mg total) by mouth daily. 30 tablet 4   Phenylephrine-DM-GG-APAP (MUCINEX FAST-MAX COLD/FLU MS PO) Take 1 Dose by mouth as needed (cough). (Patient not taking: Reported on 03/21/2023)     prochlorperazine (COMPAZINE) 10 MG tablet Take 1 tablet (10 mg total) by mouth every 6 (six) hours as needed. (Patient not taking: Reported on 03/21/2023) 30 tablet 2   promethazine (PHENERGAN) 12.5 MG tablet Take 1 tablet (12.5 mg total) by mouth every 6 (six) hours as needed for nausea or vomiting. (Patient not taking: Reported on 03/21/2023) 30 tablet 2   REPATHA SURECLICK 140 MG/ML SOAJ INJECT 140 MG INTO THE SKIN EVERY 14 (FOURTEEN) DAYS. 6 mL 3   No current facility-administered medications for this visit.    VITAL SIGNS: LMP  (LMP Unknown)  There were no vitals filed for this visit.   Estimated body mass index is 22.83 kg/m as calculated from the following:   Height as of 01/30/23: 5\' 4"  (1.626 m).   Weight as of 03/13/23: 133 lb (60.3 kg).   PERFORMANCE STATUS (ECOG) : 1 - Symptomatic but completely ambulatory  ASSESSMENT NAD, ambulatory with cane RRR Normal breathing pattern AAO x4  IMPRESSION: Allison [name] presents to clinic for follow-up. No acute distress. Is doing well overall. Denies nausea, vomiting, or diarrhea. Is remaining active. Appetite is good. Occasional fatigue. Denies pain or discomfort.   States she is having some insomnia. She has tried melatonin. Did not sleep well last night. She was previously taking Xanax which was helping. We discussed ways to assist with better sleep pattern.   Some concerns with constipation. She is taking colace twice daily. Education provided on use of Senna-S 2 tablets daily at bedside. She verbalized understanding.   All questions and support provided. Patient knows we are available as  needed.    PLAN: Symptoms are well controlled.  Anxiety well controlled. Occasional use of Xanax at bedtime.   Senna-S 2 tablets at bedtime  Patient reports she was recently discharged from [name]'s home palliative program as she no longer met criteria.  I will plan to see back in 6-8 weeks in collaboration with her other oncology appointments.    Patient expressed understanding and was in agreement with this plan. She also understands that She can call the clinic at any time with any questions, concerns, or complaints.     Any controlled substances utilized were prescribed in the context of palliative care. PDMP has been reviewed.    Visit consisted of counseling and education dealing with the complex and emotionally intense issues of symptom management and palliative care in the setting of serious and potentially life-threatening illness.Greater than  50%  of this time was spent counseling and coordinating care related to the above assessment and plan.  Willette Alma, AGPCNP-BC  Palliative Medicine Team/[facility]  *Please note that this is a verbal dictation therefore any spelling or grammatical errors are due to the "[employer] One" system interpretation.

## 2023-05-02 ENCOUNTER — Inpatient Hospital Stay (HOSPITAL_BASED_OUTPATIENT_CLINIC_OR_DEPARTMENT_OTHER): Payer: Medicare HMO | Admitting: Nurse Practitioner

## 2023-05-02 ENCOUNTER — Encounter: Payer: Self-pay | Admitting: Internal Medicine

## 2023-05-02 ENCOUNTER — Inpatient Hospital Stay: Payer: Medicare HMO

## 2023-05-02 ENCOUNTER — Inpatient Hospital Stay: Payer: Medicare HMO | Admitting: Internal Medicine

## 2023-05-02 ENCOUNTER — Other Ambulatory Visit (HOSPITAL_COMMUNITY): Payer: Self-pay

## 2023-05-02 ENCOUNTER — Encounter: Payer: Self-pay | Admitting: Physician Assistant

## 2023-05-02 ENCOUNTER — Encounter: Payer: Self-pay | Admitting: Nurse Practitioner

## 2023-05-02 VITALS — BP 152/65 | HR 61 | Temp 97.3°F | Resp 16 | Ht 64.0 in | Wt 133.9 lb

## 2023-05-02 DIAGNOSIS — Z515 Encounter for palliative care: Secondary | ICD-10-CM

## 2023-05-02 DIAGNOSIS — E876 Hypokalemia: Secondary | ICD-10-CM | POA: Diagnosis not present

## 2023-05-02 DIAGNOSIS — C3490 Malignant neoplasm of unspecified part of unspecified bronchus or lung: Secondary | ICD-10-CM

## 2023-05-02 DIAGNOSIS — C3491 Malignant neoplasm of unspecified part of right bronchus or lung: Secondary | ICD-10-CM | POA: Diagnosis not present

## 2023-05-02 DIAGNOSIS — R530 Neoplastic (malignant) related fatigue: Secondary | ICD-10-CM | POA: Diagnosis not present

## 2023-05-02 DIAGNOSIS — C3411 Malignant neoplasm of upper lobe, right bronchus or lung: Secondary | ICD-10-CM | POA: Diagnosis not present

## 2023-05-02 DIAGNOSIS — R630 Anorexia: Secondary | ICD-10-CM | POA: Diagnosis not present

## 2023-05-02 DIAGNOSIS — Z23 Encounter for immunization: Secondary | ICD-10-CM

## 2023-05-02 MED ORDER — INFLUENZA VAC A&B SURF ANT ADJ 0.5 ML IM SUSY
0.5000 mL | PREFILLED_SYRINGE | Freq: Once | INTRAMUSCULAR | Status: AC
  Administered 2023-05-02: 0.5 mL via INTRAMUSCULAR
  Filled 2023-05-02: qty 0.5

## 2023-05-02 MED ORDER — HYOSCYAMINE SULFATE 0.125 MG SL SUBL
0.1250 mg | SUBLINGUAL_TABLET | Freq: Four times a day (QID) | SUBLINGUAL | 3 refills | Status: DC | PRN
  Filled 2023-05-02: qty 30, 4d supply, fill #0
  Filled 2024-04-21 – 2024-04-22 (×2): qty 30, 4d supply, fill #1

## 2023-05-02 NOTE — Progress Notes (Signed)
[facility] [phone]   [phone]  OFFICE PROGRESS NOTE  Allison Devon, MD [address] [place] [address]  DIAGNOSIS: Stage IV (T2 a,N2, M1a) non-small cell lung cancer, adenocarcinoma diagnosed in July 2019 and presented with right upper lobe lung mass in addition to mediastinal lymphadenopathy as well as bilateral pulmonary nodules and malignant right pleural effusion.   Biomarker Findings Microsatellite status - Cannot Be Determined Tumor Mutational Burden - Cannot Be Determined Genomic Findings For a complete list of the genes assayed, please refer to the Appendix. EGFR exon 19 deletion ([id number]) TP53 Y220C 7 Disease relevant genes with no reportable alterations: KRAS, ALK, BRAF, MET, RET, ERBB2, ROS1    PRIOR THERAPY:  1) Status post right Pleurx catheter placement by Dr. Donata Clay for drainage of malignant right pleural effusion. 2) palliative radiotherapy to the enlarging right upper lobe lung mass and mediastinum under the care of Dr. Roselind Messier expected to be completed on January 05, 2021. 3) Systemic chemotherapy with carboplatin for AUC of 5 and Alimta 500 Mg/M2 every 3 weeks.  First dose 03/17/2021.  The patient will also continue her current treatment with Tagrisso 80 mg p.o. daily.  She is status post 26 cycles. Starting from cycle #7, she will be on Alimta only 400 mg/m2.  Her treatment with Alimta has been on hold for several months now.  She is currently on treatment with single agent Tagrisso 80 mg p.o. daily    CURRENT THERAPY: Tagrisso 80 mg p.o. daily.  First dose was given on 01/29/2018.  Status post 63 months of treatment.  INTERVAL HISTORY: Allison [name] 72 y.o. female returns to the clinic today for follow-up visit.  Her 2 daughters were available by phone during the visit.Discussed the use of AI scribe software for clinical note transcription with the patient, who gave verbal consent to  proceed.  History of Present Illness   Allison [name], a 72 year old patient, was diagnosed with stage four non-small cell lung cancer (NSCLC) in July 2019. The tumor was found to have an EGFR mutation, specifically an exon 19 deletion. The patient has been on Tagrisso 80mg  daily since August 2019, totaling 63 months of treatment. In addition to Tagrisso, the patient received chemotherapy (carboplatin and Alimta) for around two years due to cancer progression. However, chemotherapy has been on hold for nearly a year.  The patient reports feeling fine with no new complaints. There is no chest pain or new cough, and no episodes of coughing up blood. Breathing is reported as normal, with shortness of breath only on exertion. The patient does report episodes of rapid dehydration, with one episode occurring the previous week that required rehydration with Gatorade and water.  The patient has been maintaining an active lifestyle with the help of a personal trainer once a week. Despite the diagnosis and treatment, the patient reports feeling good and continues to engage in regular activities.        MEDICAL HISTORY: Past Medical History:  Diagnosis Date   Anemia    Anxiety    Arthritis    Asthma    exercise induced   Coronary artery disease    Depression    PMH   Dyspnea    GERD (gastroesophageal reflux disease)    Glaucoma    History of radiation therapy 01/05/2021   IMRT right lung  11/24/2020-01/05/2021  Dr Antony Blackbird   Hypertension    lung ca dx'd 11/2017  right   Malignant pleural effusion    right   Nuclear sclerotic cataract of right eye 02/28/2021   Dr. Sallye Lat, cataract surgery February 2023   PONV (postoperative nausea and vomiting)    Pre-diabetes    Raynaud's disease    Raynaud's disease    Stroke (HCC) 01/2021   balance off, some express aphasia, weakness    ALLERGIES:  is allergic to hydrocodone-acetaminophen, penicillins, lactose, vicodin  [hydrocodone-acetaminophen], lactase-lactobacillus, and other.  MEDICATIONS:  Current Outpatient Medications  Medication Sig Dispense Refill   acetaminophen (TYLENOL) 500 MG tablet Take 1,000 mg by mouth every 6 (six) hours as needed (pain).     ALPRAZolam (XANAX) 0.25 MG tablet Take 1 tablet (0.25 mg total) by mouth 2 (two) times daily as needed for anxiety. May take 2 tablets (0.5 mg total) at bedtime. 60 tablet 0   amLODipine (NORVASC) 5 MG tablet Take 1 tablet (5 mg total) by mouth daily. 90 tablet 3   carvedilol (COREG) 12.5 MG tablet Take 12.5 mg by mouth 2 (two) times daily with a meal.     CHELATED MAGNESIUM PO Take 200 mg by mouth daily.     Cholecalciferol (VITAMIN D) 125 MCG (5000 UT) CAPS Take 1 Capful by mouth daily.     docusate sodium (COLACE) 100 MG capsule Take 1 capsule (100 mg total) by mouth 2 (two) times daily. 60 capsule 5   dorzolamide-timolol (COSOPT) 22.3-6.8 MG/ML ophthalmic solution Place 1 drop into both eyes 2 (two) times daily.     enoxaparin (LOVENOX) 80 MG/0.8ML injection Inject 80 mg into the skin daily.     ezetimibe (ZETIA) 10 MG tablet Take 10 mg by mouth daily.     FeFum-FePoly-FA-B Cmp-C-Biot (FOLIVANE-PLUS) CAPS TAKE 1 CAPSULE BY MOUTH EVERY DAY IN THE MORNING 90 capsule 0   FLUoxetine (PROZAC) 20 MG capsule Take 1 capsule (20 mg total) by mouth daily. 90 capsule 3   folic acid (FOLVITE) 1 MG tablet TAKE 1 TABLET BY MOUTH EVERY DAY 90 tablet 1   hyoscyamine (LEVSIN SL) 0.125 MG SL tablet Place 1-2 tablets (0.125-0.25 mg total) under the tongue every 6 (six) hours as needed for cramping. 30 tablet 3   ipratropium-albuterol (DUONEB) 0.5-2.5 (3) MG/3ML SOLN Take 3 mLs by nebulization every 6 (six) hours as needed (Asthma).     isosorbide mononitrate (IMDUR) 30 MG 24 hr tablet Take 0.5 tablets (15 mg total) by mouth daily. 90 tablet 3   latanoprost (XALATAN) 0.005 % ophthalmic solution Place 1 drop into both eyes at bedtime.      ondansetron (ZOFRAN-ODT) 4 MG  disintegrating tablet Take 1 tablet (4 mg total) by mouth every 8 (eight) hours as needed for nausea or vomiting. 30 tablet 1   osimertinib mesylate (TAGRISSO) 80 MG tablet Take 1 tablet (80 mg total) by mouth daily. 30 tablet 4   Phenylephrine-DM-GG-APAP (MUCINEX FAST-MAX COLD/FLU MS PO) Take 1 Dose by mouth as needed (cough). (Patient not taking: Reported on 03/21/2023)     prochlorperazine (COMPAZINE) 10 MG tablet Take 1 tablet (10 mg total) by mouth every 6 (six) hours as needed. (Patient not taking: Reported on 03/21/2023) 30 tablet 2   promethazine (PHENERGAN) 12.5 MG tablet Take 1 tablet (12.5 mg total) by mouth every 6 (six) hours as needed for nausea or vomiting. (Patient not taking: Reported on 03/21/2023) 30 tablet 2   REPATHA SURECLICK 140 MG/ML SOAJ INJECT 140 MG INTO THE SKIN EVERY 14 (FOURTEEN) DAYS. 6 mL 3  No current facility-administered medications for this visit.    SURGICAL HISTORY:  Past Surgical History:  Procedure Laterality Date   ABDOMINAL HYSTERECTOMY     partial   BRONCHIAL BIOPSY  04/21/2021   Procedure: BRONCHIAL BIOPSIES;  Surgeon: Josephine Igo, DO;  Location: [facility];  Service: Pulmonary;;   BRONCHIAL BRUSHINGS  04/21/2021   Procedure: BRONCHIAL BRUSHINGS;  Surgeon: Josephine Igo, DO;  Location: [facility];  Service: Pulmonary;;   BRONCHIAL NEEDLE ASPIRATION BIOPSY  04/21/2021   Procedure: BRONCHIAL NEEDLE ASPIRATION BIOPSIES;  Surgeon: Josephine Igo, DO;  Location: [facility];  Service: Pulmonary;;   CHEST TUBE INSERTION Right 01/01/2018   Procedure: INSERTION PLEURAL DRAINAGE CATHETER;  Surgeon: Kerin Perna, MD;  Location: [facility] OR;  Service: Thoracic;  Laterality: Right;   CHEST TUBE INSERTION  04/21/2021   Procedure: CHEST TUBE INSERTION;  Surgeon: Josephine Igo, DO;  Location: [facility];  Service: Pulmonary;;   COLONOSCOPY     CORONARY STENT INTERVENTION N/A 06/16/2019   Procedure: CORONARY STENT INTERVENTION;  Surgeon: Corky Crafts, MD;  Location: [facility] INVASIVE CV LAB;  Service: Cardiovascular;  Laterality: N/A;   DILATION AND CURETTAGE OF UTERUS     EYE SURGERY     due to Glaucoma   IR IMAGING GUIDED PORT INSERTION  03/22/2021   IR PORT REPAIR CENTRAL VENOUS ACCESS DEVICE  04/15/2021   LEFT HEART CATH AND CORONARY ANGIOGRAPHY N/A 06/16/2019   Procedure: LEFT HEART CATH AND CORONARY ANGIOGRAPHY;  Surgeon: Corky Crafts, MD;  Location: [facility] INVASIVE CV LAB;  Service: Cardiovascular;  Laterality: N/A;   REMOVAL OF PLEURAL DRAINAGE CATHETER Right 11/07/2018   Procedure: REMOVAL OF PLEURAL DRAINAGE CATHETER;  Surgeon: Kerin Perna, MD;  Location: [facility] OR;  Service: Thoracic;  Laterality: Right;   ROTATOR CUFF REPAIR     TUBAL LIGATION     VIDEO BRONCHOSCOPY WITH ENDOBRONCHIAL NAVIGATION Bilateral 04/21/2021   Procedure: VIDEO BRONCHOSCOPY WITH ENDOBRONCHIAL NAVIGATION;  Surgeon: Josephine Igo, DO;  Location: [facility];  Service: Pulmonary;  Laterality: Bilateral;  ION   WISDOM TOOTH EXTRACTION      REVIEW OF SYSTEMS:  Constitutional: negative Eyes: negative Ears, nose, mouth, throat, and face: negative Respiratory: positive for dyspnea on exertion Cardiovascular: negative Gastrointestinal: negative Genitourinary:negative Integument/breast: negative Hematologic/lymphatic: negative Musculoskeletal:negative Neurological: negative Behavioral/Psych: negative Endocrine: negative Allergic/Immunologic: negative   PHYSICAL EXAMINATION: General appearance: alert, cooperative, appears stated age, and no distress Head: Normocephalic, without obvious abnormality, atraumatic Neck: no adenopathy, no JVD, supple, symmetrical, trachea midline, and thyroid not enlarged, symmetric, no tenderness/mass/nodules Lymph nodes: Cervical, supraclavicular, and axillary nodes normal. Resp: clear to auscultation bilaterally Back: symmetric, no curvature. ROM normal. No CVA tenderness. Cardio: regular rate and rhythm, S1,  S2 normal, no murmur, click, rub or gallop GI: soft, non-tender; bowel sounds normal; no masses,  no organomegaly Extremities: extremities normal, atraumatic, no cyanosis or edema Neurologic: Alert and oriented X 3, normal strength and tone. Normal symmetric reflexes. Normal coordination and gait  ECOG PERFORMANCE STATUS: 1 - Symptomatic but completely ambulatory  Blood pressure (!) 152/65, pulse 61, temperature (!) 97.3 F (36.3 C), temperature source Temporal, resp. rate 16, height 5\' 4"  (1.626 m), weight 133 lb 14.4 oz (60.7 kg), SpO2 100%.  LABORATORY DATA: Lab Results  Component Value Date   WBC 3.6 (L) 04/25/2023   HGB 12.0 04/25/2023   HCT 36.9 04/25/2023   MCV 83.9 04/25/2023   PLT 145 (L) 04/25/2023      Chemistry  Component Value Date/Time   NA 138 04/25/2023 0740   NA 140 03/04/2021 1516   K 3.8 04/25/2023 0740   CL 105 04/25/2023 0740   CO2 27 04/25/2023 0740   BUN 18 04/25/2023 0740   BUN 21 03/04/2021 1516   CREATININE 1.23 (H) 04/25/2023 0740      Component Value Date/Time   CALCIUM 9.4 04/25/2023 0740   ALKPHOS 75 04/25/2023 0740   AST 28 04/25/2023 0740   ALT 29 04/25/2023 0740   BILITOT 0.4 04/25/2023 0740       RADIOGRAPHIC STUDIES: CT CHEST ABDOMEN PELVIS W CONTRAST  Result Date: 04/27/2023 CLINICAL DATA:  Non-small cell lung cancer restaging * Tracking Code: BO * EXAM: CT CHEST, ABDOMEN, AND PELVIS WITH CONTRAST TECHNIQUE: Multidetector CT imaging of the chest, abdomen and pelvis was performed following the standard protocol during bolus administration of intravenous contrast. RADIATION DOSE REDUCTION: This exam was performed according to the departmental dose-optimization program which includes automated exposure control, adjustment of the mA and/or kV according to patient size and/or use of iterative reconstruction technique. CONTRAST:  OMNIPAQUE IOHEXOL 300 MG/ML  SOLN COMPARISON:  01/22/2023 FINDINGS: CT CHEST FINDINGS Cardiovascular:  Right Port-A-Cath tip: Right atrium. Atheromatous vascular calcification in the aorta and right coronary artery. Mediastinum/Nodes: No measurable adenopathy or discrete mediastinal mass. Lungs/Pleura: Stable small right pleural effusion. Stable consolidation the right lung apex likely from prior radiation therapy. Suspected radiation therapy related findings in the superior segment right lower lobe as well. Musculoskeletal: Unremarkable CT ABDOMEN PELVIS FINDINGS Hepatobiliary: Unremarkable Pancreas: Unremarkable Spleen: Unremarkable Adrenals/Urinary Tract: Bladder wall thickening is probably from nondistention. Mild scarring of the right kidney lower pole. No significant lesion identified. Stomach/Bowel: Mild prominence of stool in the distal half of the colon could reflect mild constipation. Vascular/Lymphatic: Atherosclerosis is present, including aortoiliac atherosclerotic disease. No pathologic adenopathy in the abdomen/pelvis. Reproductive: Unremarkable Other: No supplemental non-categorized findings. Musculoskeletal: Grade 1 degenerative anterolisthesis at L5-S1. IMPRESSION: 1. No findings of active malignancy. Treatment related findings in the right lung. 2. Stable small right pleural effusion. 3. Mild prominence of stool in the distal half of the colon could reflect mild constipation. 4. Grade 1 degenerative anterolisthesis at L5-S1. 5. Aortic atherosclerosis. Aortic Atherosclerosis (ICD10-I70.0). Electronically Signed   By: Gaylyn Rong M.D.   On: 04/27/2023 09:00    ASSESSMENT AND PLAN: This is a very pleasant 72 years old never smoker African-American female recently with a stage IV non-small cell lung cancer, adenocarcinoma with positive EGFR mutation with deletion in exon 19. The patient was started on treatment with Tagrisso 80 mg p.o. daily status post 35 months of treatment. She has been tolerating this treatment well with no concerning adverse effects except for intermittent  diarrhea. She had repeat CT scan of the chest, abdomen pelvis performed recently.  I personally and independently reviewed the scans and discussed the results with the patient and her boyfriend today. Unfortunately the CT scan showed interval progression of the right apical lung mass in addition to progression of mediastinal lymphadenopathy concerning for worsening of her disease. She has no actionable resistant mutation on the molecular studies performed by [facility]. The patient continued her current treatment with Tagrisso and tolerating it fairly well. 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 Roselind Messier completed January 05, 2021. The patient had significant opacities in her lung that was initially thought to be secondary to radiation treatment versus  Tagrisso induced pneumonitis versus lymphangitic spread of the tumor.  She was treated with high-dose taper regiment of prednisone Repeat imaging studies after the palliative radiotherapy showed evidence for disease progression. Her molecular studies by [facility] recently showed no new resistant mutation.  After discussion of her treatment options including palliative care and hospice referral versus palliative systemic chemotherapy the patient was interested in proceeding palliative systemic chemotherapy.  She started  palliative systemic chemotherapy with carboplatin for AUC of 5 and Alimta 500 Mg/M2 every 3 weeks.  Status post 26 cycles.  Starting from cycle #7 the patient is on treatment with single agent Alimta every 3 weeks. I did not add a Avastin to her treatment because of the recent stroke.  She also continued her treatment with Tagrisso at the same time.  Her treatment with Alimta was discontinued more than 12 months ago secondary to intolerance and fatigue. She has been tolerating her treatment with Tagrisso fairly well. She had repeat  CT scan of the chest, abdomen and pelvis performed recently.  I personally and independently reviewed the scan images and discussed the result with the patient today. Her scan showed no concerning findings for disease progression. Assessment and Plan    Stage IV Non-Small Cell Lung Cancer with EGFR Mutation Stage IV non-small cell lung cancer diagnosed in July 2019 with EGFR exon 19 deletion mutation. Currently on Tagrisso (osimertinib) 80 mg daily for 63 months. Previous treatment included carboplatin and Alimta, on hold for nearly a year. Recent scan shows no new growth, only scarring from previous radiation. Radiologist did not see any evidence of cancer, likely masked by scarring. Patient reports feeling well with no new symptoms such as chest pain, hemoptysis, or significant respiratory issues. Mild exertional dyspnea and an episode of dehydration last week, resolved with oral rehydration. Patient has surpassed the five-year survival mark, a positive outcome. - Continue Tagrisso 80 mg daily - Monitor hydration status and encourage regular fluid intake - Schedule follow-up scan in three months - Ensure port flush as needed  General Health Maintenance Discussed the importance of staying up-to-date with vaccinations, especially given her immunocompromised status due to ongoing cancer treatment. - Administer flu shot before patient leaves today  Follow-up - Schedule follow-up appointment in three months.   The patient was advised to call immediately if she has any concerning symptoms in the interval. The patient voices understanding of current disease status and treatment options and is in agreement with the current care plan.  All questions were answered. The patient knows to call the clinic with any problems, questions or concerns. We can certainly see the patient much sooner if necessary.  he total time spent in the appointment was 30 minutes.  Disclaimer: This note was dictated with  voice recognition software. Similar sounding words can inadvertently be transcribed and may not be corrected upon review.

## 2023-05-04 ENCOUNTER — Telehealth: Payer: Self-pay | Admitting: Internal Medicine

## 2023-05-04 NOTE — Telephone Encounter (Signed)
Patient is aware of scheduled appointment times/dates

## 2023-05-10 ENCOUNTER — Telehealth: Payer: Self-pay | Admitting: Pulmonary Disease

## 2023-05-10 NOTE — Telephone Encounter (Signed)
Patient usually receives blood thinner medication from pcp but there is a shortage of lovenox. She's already taken Eliquis before.She needs to know what other blood thinner can be prescribed for her instead.  [phone]

## 2023-05-14 ENCOUNTER — Other Ambulatory Visit: Payer: Self-pay

## 2023-05-14 ENCOUNTER — Other Ambulatory Visit (HOSPITAL_COMMUNITY): Payer: Self-pay

## 2023-05-14 MED ORDER — ENOXAPARIN SODIUM 80 MG/0.8ML IJ SOSY
80.0000 mg | PREFILLED_SYRINGE | Freq: Two times a day (BID) | INTRAMUSCULAR | 11 refills | Status: DC
  Filled 2023-05-14: qty 8, 5d supply, fill #0
  Filled 2023-05-14: qty 40, 25d supply, fill #0
  Filled 2023-05-14: qty 8, 5d supply, fill #0

## 2023-05-14 NOTE — Telephone Encounter (Signed)
Called and spoke with patient, she states she has had a DVT, stroke and stage 4 lung cancer.  She was sent home in 2022 on Lovenox and she was transitioned to Eliquis, however, she had another stroke on Eliquis.  She spoke with her PCP and she wanted to put her on Eliquis until she could get the Lovenox.  Her PCP office is Dr. [name] and the nurse she has been speaking with is Johnny Bridge at her office.  She only has 3 syringes of Lovenox left, she take 80 mg daily.  Her pcp office is the go between the manufacturer where she gets patient assistance and herself.  She picks up the lovenox from Dr. Mathews Robinsons office.  The last time there was a shortage of Lovenox, she was sent (2) 40 mg vials of lovenox to make 80 mg.  Her PCP office stated that her pulmonologist was the one that originally prescribed the lovenox, however, I do not see that from her chart.  She has had medications filled at the [facility] on [facility] before.  I advised her I would call them and see if there is anything they can do to help her out until she can get her supply from the manufacturer and then I would call her back.  She verbalized understanding.  Called the [facility] and spoke with Rosanne Ashing and he stated that they have the generic for Lovenox 80 mg in stock at the pharmacy.  I called the patient back and provided her with the above information.  I advised her to call Johnny Bridge at Dr. Mathews Robinsons office and provide them with that information and see what they could do for her.  She stated that when she calls about the Lovenox, she gets push back from their office.  She provided me with the name and phone # for the manufacturer/patient connection #, [employer] [phone].  I let her know I would call and see what I could do and then call her back.  She was very Adult nurse.  Called [employer] patient connection at [phone] and spoke with Aurther Loft.  She initially did not want to speak to me as I was not  calling from Dr. Mathews Robinsons office.  I advised her that the patient was at a high risk of stroke d/t her going 3-4 days without her injection since it is on back order.  Aurther Loft stated that d/t the holidays, nothing is being shipped out at this time and they do not have Lovenox 40 mg at this time either.  I was advised to have the patient contact Dr. Mathews Robinsons office and ask that they write a script for her to get the generic Lovenox 80 mg until she can get her shipment once it is no longer on back order.  Called Dr. Cala Bradford [name]'s office and left a detailed message for [facility] the nurse.  I left all the information regarding generic Lovenox being in stock at the [facility] and that I had called and spoken with [employer] and was advised that she would need to have it filled at a local pharmacy until the name brand through [employer] was available for shipment.  I requested a return call to [phone].

## 2023-05-14 NOTE — Telephone Encounter (Signed)
Arranged for a Video visit with Allison [name] tomorrow at 3 pm incase I do not hear back from Dr. Mathews [name] [name] today so we can arrange for Allison [name] Lovenox script locally.  Allison [name] called and spoke with the patient and made Allison [name] aware.  Allison [name] explained the process to the patient of signing on for the visit and advised to sign on at 2:45 pm.  Allison [name] let Allison [name] know we will plan on this video visit unless Allison [name] call Allison [name] back later this afternoon and let Allison [name] know Allison [name] have heard back from Allison [name] PCP office.  She verbalized understanding.

## 2023-05-15 ENCOUNTER — Encounter: Payer: Self-pay | Admitting: Acute Care

## 2023-05-15 ENCOUNTER — Other Ambulatory Visit (HOSPITAL_COMMUNITY): Payer: Self-pay

## 2023-05-15 ENCOUNTER — Telehealth (INDEPENDENT_AMBULATORY_CARE_PROVIDER_SITE_OTHER): Payer: Medicare HMO | Admitting: Acute Care

## 2023-05-15 DIAGNOSIS — Z86718 Personal history of other venous thrombosis and embolism: Secondary | ICD-10-CM

## 2023-05-15 DIAGNOSIS — C3490 Malignant neoplasm of unspecified part of unspecified bronchus or lung: Secondary | ICD-10-CM

## 2023-05-15 DIAGNOSIS — Z86711 Personal history of pulmonary embolism: Secondary | ICD-10-CM

## 2023-05-15 DIAGNOSIS — Z8673 Personal history of transient ischemic attack (TIA), and cerebral infarction without residual deficits: Secondary | ICD-10-CM | POA: Diagnosis not present

## 2023-05-15 DIAGNOSIS — Z923 Personal history of irradiation: Secondary | ICD-10-CM | POA: Diagnosis not present

## 2023-05-15 NOTE — Progress Notes (Signed)
Virtual Visit via Video Note  I connected with Allison [name] on 05/15/23 at  3:00 PM EST by a video enabled telemedicine application and verified that I am speaking with the correct person using two identifiers.  Location: Patient:  At home Provider: [address] [place], [place], [address]    I discussed the limitations of evaluation and management by telemedicine and the availability of in person appointments. The patient expressed understanding and agreed to proceed.  History of Present Illness: 72 y.o. female  former smoker with stage 4 adenocarcinoma of the right lung, (12/2017), current  chemotherapy, hypertension, CVA, CAD, DVT/PE (on lifetime Lovenox, failed Eliquis in 2022 with recurrent thromboembolic event),iron deficiency anemia,  and GERD. She is followed by Dr. [name]. She is treated with Lovenox to prevent recurrent PE/ DVT and stroke.  Pt. Called the pulmonary office 05/14/2023 as she had been getting Lovenox with financial assistance through [employer].  It was being sent to her PCP office from the manufacturer, and the patient would pick up the medication from Dr. Mathews Robinsons office. Her dose has been 80 mg daily, and she has been doing well with this regimen. No further thromboembolic events. She was notified by [employer] that they were unable to sent medication due to a shortage. This had happened in the past, but the manufacturer was able to sent 40 mg doses that the patient took twice daily for the 80 mg total. This was not an option currently. At the time she called our office on 05/14/2023, she only has 3 syringes of Lovenox left. She spoke with Tomma Lightning, RN who understood that the patient could not have a gap in therapy. Heather made repeated calls between the manufacturer, Dr. Mathews Robinsons office and [facility].  She confirmed that [facility]  had the generic for Lovenox 80 mg in stock at the pharmacy. She then called Dr.  Mathews Robinsons office requesting a prescription for the generic Lovenox to ensure the patient did not have a gap in therapy. As she was unsure of Dr. Mathews Robinsons ability to get a prescription with a short turn around , she added the patient on my schedule for a video visit, to allow [place] to write the prescription to ensure ongoing therapy if necessary.   Today the patient notified me while we were on the video visit, that Dr. [name] had sent in the prescription and she had received the generic Lovenox from the [facility]. She had it in her hand during the visit.  There was some additional confusion as the prescription was written as 80 mg BID, which is double the patient's maintenance dose. I told the patient to continue with 80 mg once daily as she has been doing since 2022. She has a visit with Dr. [name] next week an states she will get her to correct the prescription.   Patient is doing well. She is wearing her oxygen is continuing to follow with Dr.Mohamed.   Current therapy is Tagrisso 80 mg p.o. daily.  First dose was given on 01/29/2018.  Status post 63 months of treatment.  She has actually had some weight gain and is up to 134 pounds. CT Imaging 04/2023  shows no active cancer.  Observations/Objective: CT Chest 04/25/2023 No findings of active malignancy. Treatment related findings in the right lung. 2. Stable small right pleural effusion. 3. Mild prominence of stool in the distal half of the colon could reflect mild constipation. 4. Grade 1  degenerative anterolisthesis at L5-S1. 5. Aortic atherosclerosis.  Assessment and Plan: Concern for gap in therapy of Lovenox to prevent PE/ DVT/ Stroke History of failure of Eliquis Needs Lifelong Enoxaparin Plan Take generic enoxaparin 80 mg SQ daily  Follow up with Dr. [name] as you have scheduled Monday Please have her correct Enoxaparin Prescription to 80 mg daily Follow up with Dr. [name] [facility] for port a cath flushes.   Call if you need [place]    Follow Up Instructions: Follow up as needed    I discussed the assessment and treatment plan with the patient. The patient was provided an opportunity to ask questions and all were answered. The patient agreed with the plan and demonstrated an understanding of the instructions.   The patient was advised to call back or seek an in-person evaluation if the symptoms worsen or if the condition fails to improve as anticipated.  I provided 30 minutes of video face-to-face time during this encounter.   Bevelyn Ngo, NP  05/15/2023

## 2023-05-22 IMAGING — CT CT CHEST W/ CM
2 of 5 series · 15 of 36 positions shown, 18 images · IV contrast (omnipaque)
Comparison: 02/04/2021

CLINICAL DATA: Non-small cell lung cancer staging

EXAM:
CT CHEST WITH CONTRAST
TECHNIQUE: Multidetector CT imaging of the chest was performed during
intravenous contrast administration.
CONTRAST:  60mL OMNIPAQUE IOHEXOL 350 MG/ML SOLN

[Series 3: thins · axial · 0.60mm/px · z∈[-176,+100]mm · 12 of 624 slices shown, 15 images]
[im 35/624  mediastinal]
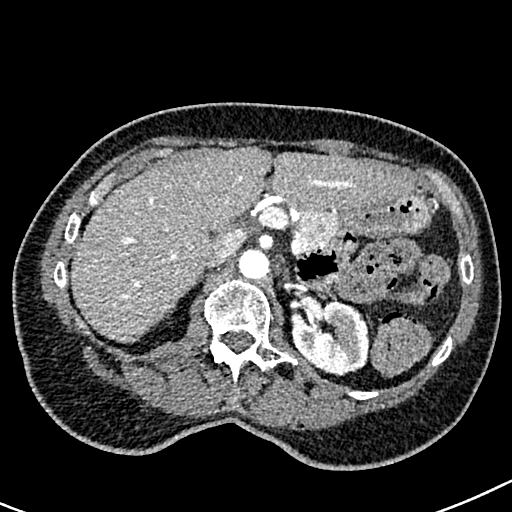
[im 35/624  lung]
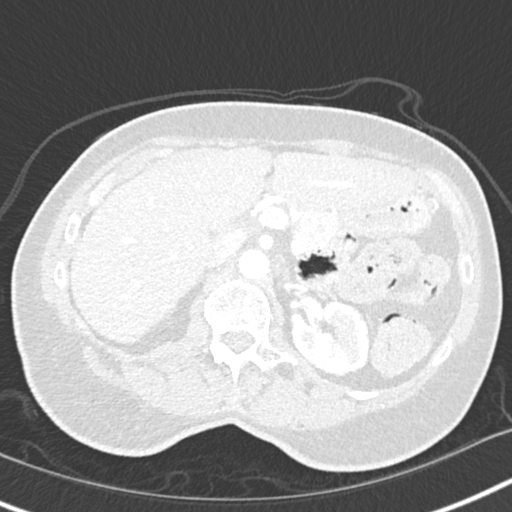
[im 104/624  lung]
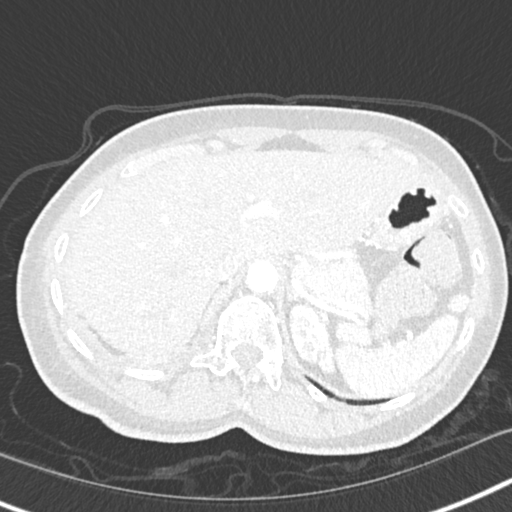
[im 139/624  lung]
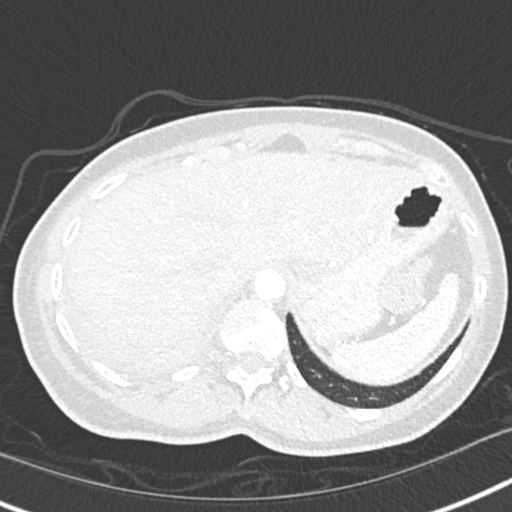
[im 174/624  lung]
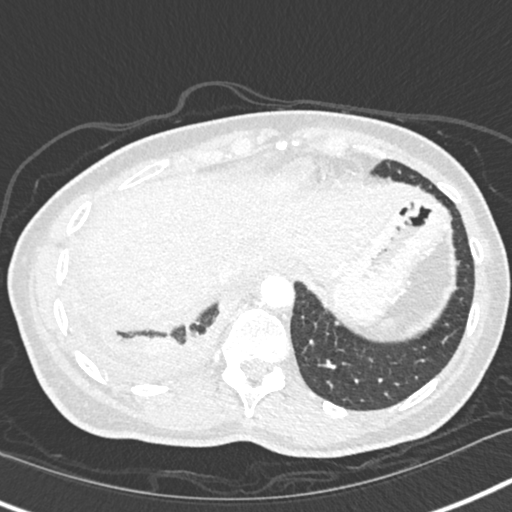
[im 243/624  mediastinal]
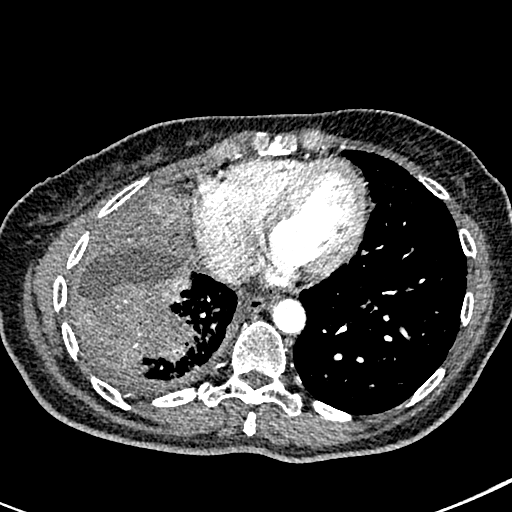
[im 243/624  lung]
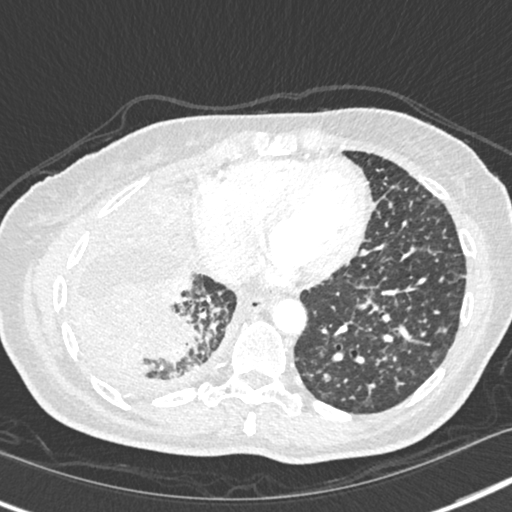
[im 277/624  lung]
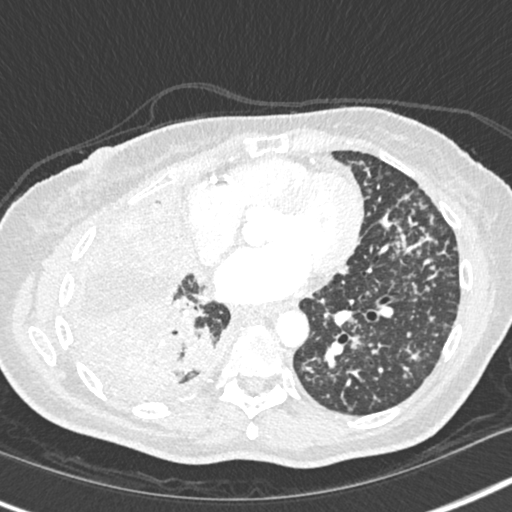
[im 347/624  lung]
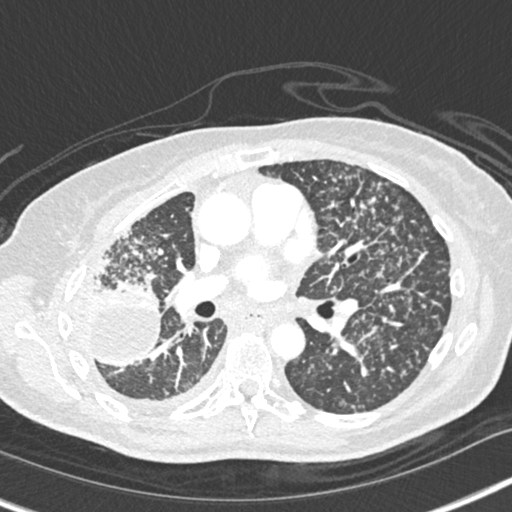
[im 381/624  lung]
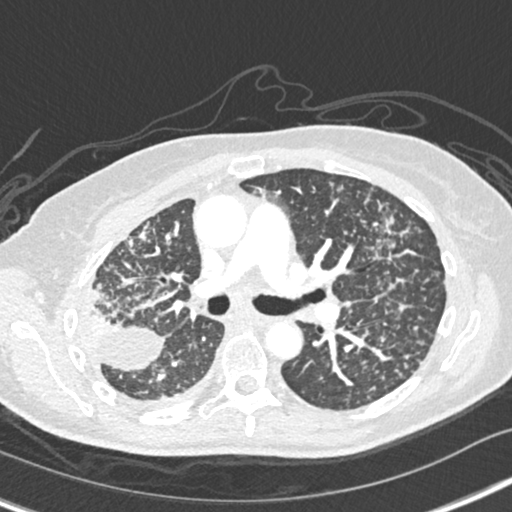
[im 450/624  mediastinal]
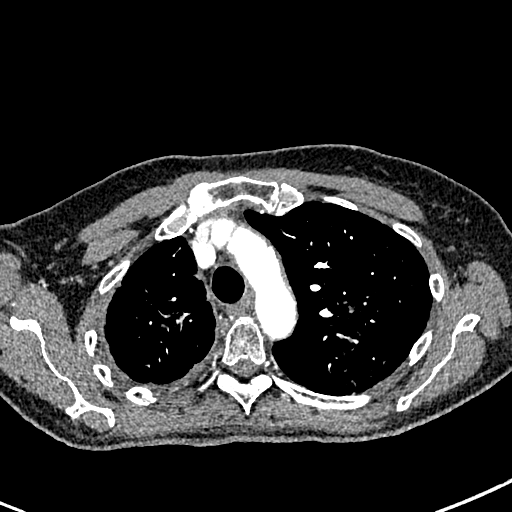
[im 450/624  lung]
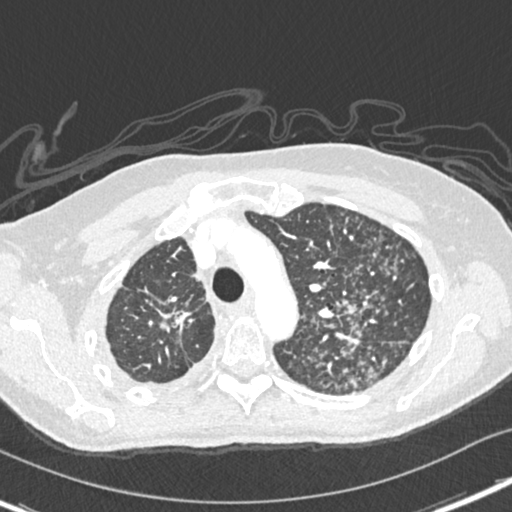
[im 485/624  lung]
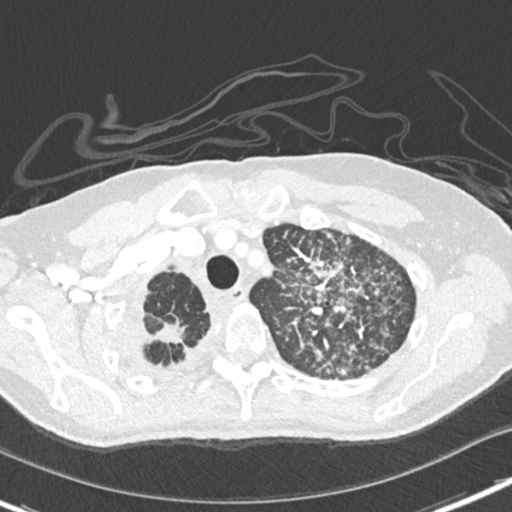
[im 520/624  lung]
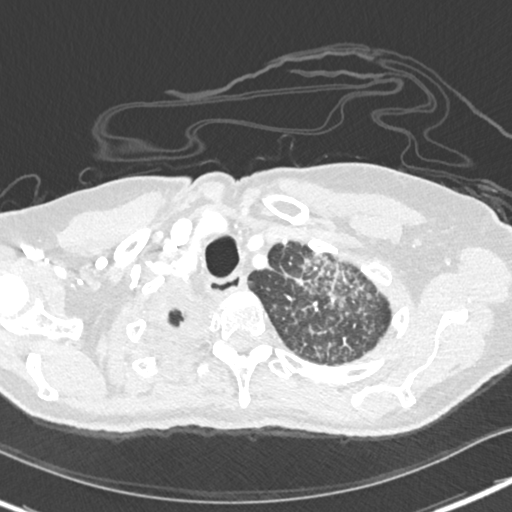
[im 589/624  lung]
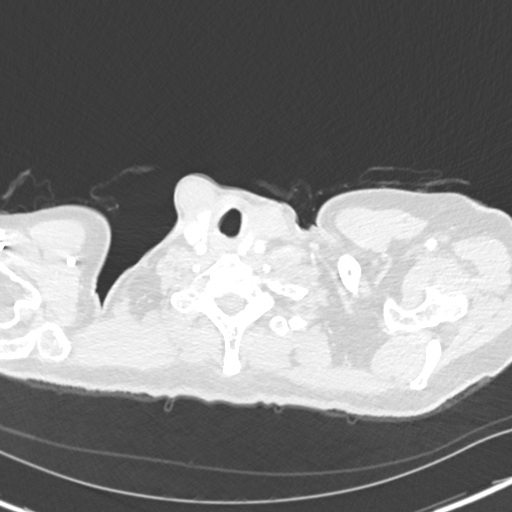

[Series 6: coronal · coronal · 0.64mm/px · 3 of 113 slices shown]
[im 23/113  lung]
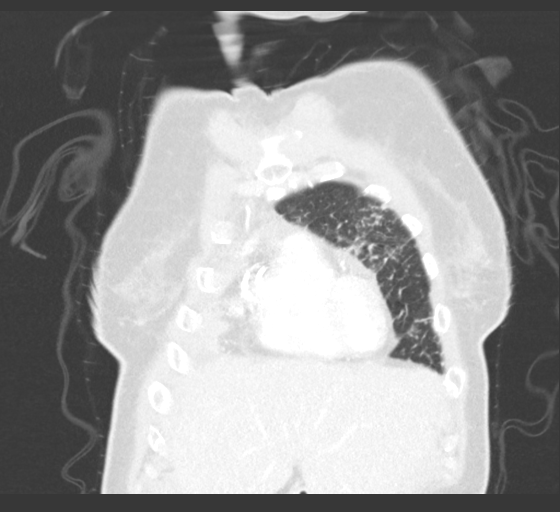
[im 45/113  lung]
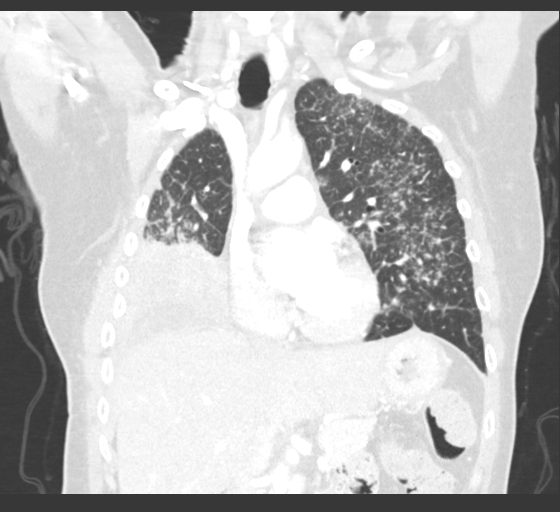
[im 68/113  lung]
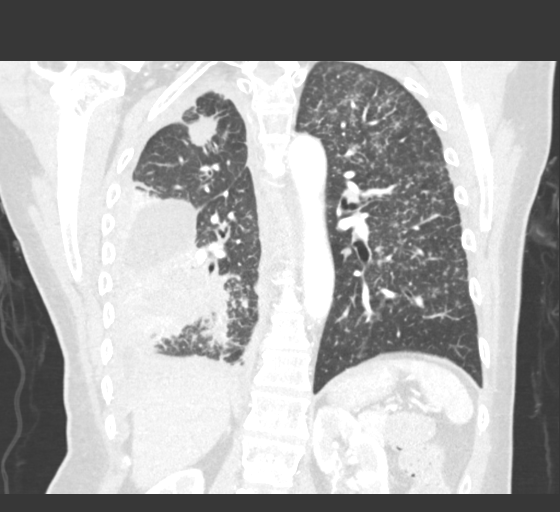

[15 of 36 positions shown; findings below may reference images not displayed]

FINDINGS: Cardiovascular: Aortic atherosclerosis. Normal heart size. Left and
right coronary artery calcifications and stents. No pericardial
effusion.

Mediastinum/Nodes: No significant change in prominent,, previously
FDG avid mediastinal lymph nodes, largest pretracheal nodes
measuring 1.0 x 0.8 cm (series 2, image 56). Thyroid gland, trachea,
and esophagus demonstrate no significant findings.

Lungs/Pleura: Unchanged mass of the right pulmonary apex, measuring
2.7 x 1.7 cm (series 5, image 41). Slight interval increase in a
moderate, loculated right pleural, with extensive pleural thickening
and nodularity, as well as septal thickening and nodularity
throughout the lungs, which is increasingly confluent. Very
extensive nodularity and septal thickening of the left lung is
likewise worsened compared to prior examination.

Upper Abdomen: No acute abnormality.

Musculoskeletal: No chest wall mass or suspicious bone lesions
identified.
IMPRESSION: 1. Unchanged treated mass of the right pulmonary apex.
2. Slight interval increase in a moderate, loculated right pleural
effusion, with extensive pleural thickening and nodularity, as well
as septal thickening and nodularity, as well as septal thickening
and nodularity throughout the lungs, which is increasingly
confluent. Very extensive nodularity and septal thickening of the
left lung is likewise worsened compared to prior examination.
Findings are consistent with worsened pleural and lymphangitic
metastatic disease.
3. No significant change in prominent, previously FDG avid
mediastinal lymph nodes.
4. Coronary artery disease.

Aortic Atherosclerosis ([id number]-JXL.L).

## 2023-05-23 LAB — LAB REPORT - SCANNED
A1c: 6
EGFR: 41

## 2023-05-28 ENCOUNTER — Ambulatory Visit: Payer: Medicare HMO | Attending: Adult Health | Admitting: Adult Health

## 2023-05-28 ENCOUNTER — Encounter: Payer: Self-pay | Admitting: Adult Health

## 2023-05-28 VITALS — BP 124/78 | HR 63 | Ht 64.5 in | Wt 135.8 lb

## 2023-05-28 DIAGNOSIS — I82403 Acute embolism and thrombosis of unspecified deep veins of lower extremity, bilateral: Secondary | ICD-10-CM

## 2023-05-28 DIAGNOSIS — E7800 Pure hypercholesterolemia, unspecified: Secondary | ICD-10-CM

## 2023-05-28 DIAGNOSIS — I10 Essential (primary) hypertension: Secondary | ICD-10-CM | POA: Diagnosis not present

## 2023-05-28 DIAGNOSIS — I2510 Atherosclerotic heart disease of native coronary artery without angina pectoris: Secondary | ICD-10-CM

## 2023-05-28 DIAGNOSIS — I2699 Other pulmonary embolism without acute cor pulmonale: Secondary | ICD-10-CM

## 2023-05-28 MED ORDER — CARVEDILOL 12.5 MG PO TABS: 12.5000 mg | ORAL_TABLET | Freq: Two times a day (BID) | ORAL | 3 refills | Status: DC

## 2023-05-28 MED ORDER — REPATHA SURECLICK 140 MG/ML ~~LOC~~ SOAJ: 140.0000 mg | SUBCUTANEOUS | 3 refills | Status: DC

## 2023-05-28 MED ORDER — AMLODIPINE BESYLATE 5 MG PO TABS: 5.0000 mg | ORAL_TABLET | Freq: Every day | ORAL | 3 refills | Status: DC

## 2023-05-28 NOTE — Progress Notes (Signed)
Cardiology Office Note:  .   Date:  05/28/2023  ID:  Allison [name], DOB 03-Dec-1950, MRN [id number] PCP: Allison Devon, MD  [facility] Providers Cardiologist:  Little Ishikawa, MD  }   History of Present Illness: .   Allison [name] is a 72 y.o. female  with history of hypertension, status post DES to RCA 06/16/19, stage IV lung cancer, hypertension, hyperlipidemia, DVT/PE, CVA when last seen by me the patient was having some lightheadedness and dizziness therefore I decreased her isosorbide to 15 mg daily from 30 mg daily to allow her to have better blood pressure.    She was to continue taking carvedilol 12.5 mg twice daily and amlodipine 5 mg daily.  There was some confusion concerning her medications and they were reviewed with her.  She is here for follow-up to discuss her symptoms and review her blood pressure.  She comes today with multiple concerns that are not cardiac related.  She has had issues getting her Lovenox and is now being managed by primary care for DVT.  She failed Eliquis causing a DVT PE and had been transition to Lovenox.  She has had recent labs by her primary care physician Dr. Renae [name] and is requesting that we go over these with her.  We do not have a copy in Care Everywhere or in epic.  She is also concerned about recurrence of DVT and is asking about follow-up testing.  Other complaint is a headache which is started recently since she has had some issues with sinus congestion.   ROS: As above otherwise negative  Studies Reviewed: .    NM Stress Test 03/29/2022   The study is normal. The study is low risk.   No ST deviation was noted.   Left ventricular function is normal. Nuclear stress EF: 68 %. The left ventricular ejection fraction is hyperdynamic (>65%). End diastolic cavity size is normal.   Prior study available for comparison from 03/19/2020.   Normal stress nuclear study with no ischemia or infarction; gated EF 68 with normal wall  motion.     Echocardiogram 02/13/2021   1. Left ventricular ejection fraction, by estimation, is 60 to 65%. The  left ventricle has normal function. The left ventricle has no regional  wall motion abnormalities.   2. The mitral valve is normal in structure. Mild to moderate mitral valve  regurgitation.       Physical Exam:   VS:  BP 124/78   Pulse 63   Ht 5' 4.5" (1.638 m)   Wt 135 lb 12.8 oz (61.6 kg)   LMP  (LMP Unknown)   SpO2 100%   BMI 22.95 kg/m    Wt Readings from Last 3 Encounters:  05/28/23 135 lb 12.8 oz (61.6 kg)  05/02/23 133 lb 14.4 oz (60.7 kg)  03/13/23 133 lb (60.3 kg)    GEN: Well nourished, well developed in no acute distress NECK: No JVD; No carotid bruits CARDIAC: RRR, no murmurs, rubs, gallops RESPIRATORY:  Clear to auscultation without rales, wheezing or rhonchi  ABDOMEN: Soft, non-tender, non-distended EXTREMITIES:  No edema; No deformity   ASSESSMENT AND PLAN: .    Hypertension: Blood pressure stable without evidence of hypertension with decreased dose of isosorbide.  She denies any recurrent chest pain with lower dose.  She is given refills on amlodipine and carvedilol.   2.  History of CAD status post drug-eluting stent to the right coronary artery in 2020.  She is without recurrent  symptoms of angina and will continue isosorbide 15 mg daily as directed.   3.  Hyperlipidemia: She needs recertification and refills on Repatha.  Pharmacy is being notified.  We are requesting labs from her primary care provider as they have recently been drawn in December of this year.  4.  History of DVT/PE with CVA: She is now on Lovenox 80 mg daily.  This is being managed by primary care.         Signed, Bettey Mare. Liborio Nixon, ANP, AACC

## 2023-05-28 NOTE — Patient Instructions (Signed)
Medication Instructions:  No Changes *If you need a refill on your cardiac medications before your next appointment, please call your pharmacy*   Lab Work: No Labs If you have labs (blood work) drawn today and your tests are completely normal, you will receive your results only by: MyChart Message (if you have MyChart) OR A paper copy in the mail If you have any lab test that is abnormal or we need to change your treatment, we will call you to review the results.   Testing/Procedures: No Testing   Follow-Up: At [facility], you and your health needs are our priority.  As part of our continuing mission to provide you with exceptional heart care, we have created designated Provider Care Teams.  These Care Teams include your primary Cardiologist (physician) and Advanced Practice Providers (APPs -  Physician Assistants and Nurse Practitioners) who all work together to provide you with the care you need, when you need it.  We recommend signing up for the patient portal called "MyChart".  Sign up information is provided on this After Visit Summary.  MyChart is used to connect with patients for Virtual Visits (Telemedicine).  Patients are able to view lab/test results, encounter notes, upcoming appointments, etc.  Non-urgent messages can be sent to your provider as well.   To learn more about what you can do with MyChart, go to [email or web address].    Your next appointment:   6 month(s)  Provider:   Little Ishikawa, MD

## 2023-05-29 ENCOUNTER — Encounter: Payer: Self-pay | Admitting: Internal Medicine

## 2023-05-30 ENCOUNTER — Other Ambulatory Visit: Payer: Self-pay | Admitting: Internal Medicine

## 2023-05-30 DIAGNOSIS — R600 Localized edema: Secondary | ICD-10-CM

## 2023-05-31 ENCOUNTER — Ambulatory Visit
Admission: RE | Admit: 2023-05-31 | Discharge: 2023-05-31 | Disposition: A | Discharge status: Home / Self Care | Payer: Medicare HMO | Source: Ambulatory Visit | Attending: Internal Medicine | Admitting: Internal Medicine

## 2023-05-31 DIAGNOSIS — R600 Localized edema: Secondary | ICD-10-CM

## 2023-06-01 ENCOUNTER — Telehealth: Payer: Self-pay | Admitting: Pharmacy Technician

## 2023-06-01 ENCOUNTER — Other Ambulatory Visit (HOSPITAL_COMMUNITY): Payer: Self-pay

## 2023-06-01 ENCOUNTER — Other Ambulatory Visit: Payer: Self-pay

## 2023-06-01 MED ORDER — REPATHA SURECLICK 140 MG/ML ~~LOC~~ SOAJ
140.0000 mg | SUBCUTANEOUS | 3 refills | Status: DC
Start: 2023-06-01 — End: 2024-02-21
  Filled 2023-06-01 – 2023-06-18 (×2): qty 6, 84d supply, fill #0
  Filled 2023-09-25: qty 6, 84d supply, fill #1
  Filled 2023-12-18 (×2): qty 6, 84d supply, fill #2

## 2023-06-01 NOTE — Telephone Encounter (Signed)
-----   Message from [name] sent at 06/01/2023 11:36 AM EST ----- Regarding: FW: Rcert for Repatha Please complete PA for Repatha ----- Message ----- From: [name], CMA Sent: 05/28/2023   3:25 PM EST To: [name], RPH Subject: Rcert for Repatha                              Allison [name] ;  The above patient needs a recertification for Repatha.  Thanks

## 2023-06-01 NOTE — Telephone Encounter (Signed)
Pharmacy Patient Advocate Encounter   Received notification from Physician's Office that prior authorization for repatha is required/requested.   Insurance verification completed.   The patient is insured through [facility] .   Per test claim: PA required; PA submitted to above mentioned insurance via CoverMyMeds Key/confirmation #/EOC [id number] Status is pending

## 2023-06-01 NOTE — Telephone Encounter (Signed)
Pharmacy Patient Advocate Encounter  Received notification from [employer] [facility] that Prior Authorization for repatha has been APPROVED from 06/01/23 to 06/18/24   PA #/Case ID/Reference #: [id number]

## 2023-06-01 NOTE — Addendum Note (Signed)
Addended by: [name] on: 06/01/2023 01:33 PM   Modules accepted: Orders

## 2023-06-06 ENCOUNTER — Encounter: Payer: Self-pay | Admitting: Internal Medicine

## 2023-06-06 ENCOUNTER — Encounter: Payer: Self-pay | Admitting: Physician Assistant

## 2023-06-06 ENCOUNTER — Other Ambulatory Visit (HOSPITAL_COMMUNITY): Payer: Self-pay

## 2023-06-07 ENCOUNTER — Other Ambulatory Visit (HOSPITAL_COMMUNITY): Payer: Self-pay

## 2023-06-07 ENCOUNTER — Encounter: Payer: Self-pay | Admitting: Internal Medicine

## 2023-06-07 ENCOUNTER — Encounter: Payer: Self-pay | Admitting: Physician Assistant

## 2023-06-07 MED FILL — Fe Fum-Iron Polysacch Complex-FA-B Complex-C-Biotin Cap: ORAL | 90 days supply | Qty: 90 | Fill #0 | Status: AC

## 2023-06-11 ENCOUNTER — Inpatient Hospital Stay: Payer: Medicare HMO | Attending: Internal Medicine

## 2023-06-11 DIAGNOSIS — C3411 Malignant neoplasm of upper lobe, right bronchus or lung: Secondary | ICD-10-CM | POA: Insufficient documentation

## 2023-06-11 DIAGNOSIS — Z95828 Presence of other vascular implants and grafts: Secondary | ICD-10-CM

## 2023-06-11 MED ORDER — SODIUM CHLORIDE 0.9% FLUSH
10.0000 mL | Freq: Once | INTRAVENOUS | Status: AC
  Administered 2023-06-11: 10 mL

## 2023-06-11 MED ORDER — HEPARIN SOD (PORK) LOCK FLUSH 100 UNIT/ML IV SOLN
500.0000 [IU] | Freq: Once | INTRAVENOUS | Status: AC
  Administered 2023-06-11: 500 [IU] via INTRAVENOUS

## 2023-06-14 NOTE — Progress Notes (Signed)
 Palliative Medicine [facility]  [phone] [phone]   Name: Allison [name] Date: 06/14/2023 MRN: [id number]  DOB: 09/30/1950  Patient Care Team: [name], MD as PCP - General (Internal Medicine) Kate Lonni LITTIE, MD as PCP - Cardiology (Cardiology) Shannon Agent, MD as Consulting Physician (Radiation Oncology) Pickenpack-Cousar, Fannie SAILOR, NP as Nurse Practitioner (Nurse Practitioner) Evertt Lonell BROCKS, RN as Oncology Nurse Navigator (Oncology) Sherrod Sherrod, MD as Consulting Physician (Oncology) Cloria Annabella LITTIE, DO (Geriatric Medicine)   I connected with Allison [name] on 06/14/23 at  3:00 PM EST by phone and verified that I am speaking with the correct person using two identifiers.   I discussed the limitations, risks, security and privacy concerns of performing an evaluation and management service by telemedicine and the availability of in-person appointments. I also discussed with the patient that there may be a patient responsible charge related to this service. The patient expressed understanding and agreed to proceed.   Other persons participating in the visit and their role in the encounter: n/a   Patient's location: home  Provider's location: [facility]   Chief Complaint: f/u of symptom management    INTERVAL HISTORY: [name] is a 72 y.o. female with stage IV non-small cell lung cancer (12/2017), hypertension, CVA, CAD, DVT/PE (on Lovenox ), and GERD.  Palliative ask to see for symptom management and goals of care.  SOCIAL HISTORY:     reports that she has never smoked. She has never used smokeless tobacco. She reports that she does not currently use alcohol. She reports that she does not use drugs.  ADVANCE DIRECTIVES:  None on file   CODE STATUS: DNR  PAST MEDICAL HISTORY: Past Medical History:  Diagnosis Date   Anemia    Anxiety    Arthritis    Asthma    exercise induced   Coronary artery  disease    Depression    PMH   Dyspnea    GERD (gastroesophageal reflux disease)    Glaucoma    History of radiation therapy 01/05/2021   IMRT right lung  11/24/2020-01/05/2021  Dr Agent Shannon   Hypertension    lung ca dx'd 11/2017   right   Malignant pleural effusion    right   Nuclear sclerotic cataract of right eye 02/28/2021   Dr. Lonni Gaudy, cataract surgery February 2023   PONV (postoperative nausea and vomiting)    Pre-diabetes    Raynaud's disease    Raynaud's disease    Stroke (HCC) 01/2021   balance off, some express aphasia, weakness    ALLERGIES:  is allergic to hydrocodone -acetaminophen , penicillins, lactose, vicodin [hydrocodone -acetaminophen ], bacid, and other.  MEDICATIONS:  Current Outpatient Medications  Medication Sig Dispense Refill   acetaminophen  (TYLENOL ) 500 MG tablet Take 1,000 mg by mouth every 6 (six) hours as needed (pain).     ALPRAZolam  (XANAX ) 0.25 MG tablet Take 1 tablet (0.25 mg total) by mouth 2 (two) times daily as needed for anxiety. May take 2 tablets (0.5 mg total) at bedtime. 60 tablet 0   amLODipine  (NORVASC ) 5 MG tablet Take 1 tablet (5 mg total) by mouth daily. 90 tablet 3   carvedilol  (COREG ) 12.5 MG tablet Take 1 tablet (12.5 mg total) by mouth 2 (two) times daily with a meal. 180 tablet 3   CHELATED MAGNESIUM  PO Take 200 mg by mouth daily.     Cholecalciferol  (VITAMIN D ) 125 MCG (5000 UT) CAPS Take 1 Capful by mouth daily.  docusate sodium  (COLACE) 100 MG capsule Take 1 capsule (100 mg total) by mouth 2 (two) times daily. 60 capsule 5   dorzolamide -timolol  (COSOPT ) 22.3-6.8 MG/ML ophthalmic solution Place 1 drop into both eyes 2 (two) times daily.     enoxaparin  (LOVENOX ) 80 MG/0.8ML injection Inject 80 mg into the skin daily.     enoxaparin  (LOVENOX ) 80 MG/0.8ML injection Inject 0.8 mLs (80 mg total) into the skin every 12 (twelve) hours. (Patient not taking: Reported on 05/28/2023) 48 mL 11   ezetimibe  (ZETIA ) 10 MG tablet  Take 10 mg by mouth daily.     FeFum-FePoly-FA-B Cmp-C-Biot (FOLIVANE-PLUS) CAPS Take 1 capsule by mouth in the morning. 90 capsule 0   FLUoxetine  (PROZAC ) 20 MG capsule Take 1 capsule (20 mg total) by mouth daily. 90 capsule 3   folic acid  (FOLVITE ) 1 MG tablet TAKE 1 TABLET BY MOUTH EVERY DAY 90 tablet 1   hyoscyamine  (LEVSIN  SL) 0.125 MG SL tablet Place 1-2 tablets (0.125-0.25mg ) under the tongue every 6 (six) hours as needed for cramping. 30 tablet 3   ipratropium-albuterol  (DUONEB) 0.5-2.5 (3) MG/3ML SOLN Take 3 mLs by nebulization every 6 (six) hours as needed (Asthma).     isosorbide  mononitrate (IMDUR ) 30 MG 24 hr tablet Take 0.5 tablets (15 mg total) by mouth daily. 90 tablet 3   latanoprost  (XALATAN ) 0.005 % ophthalmic solution Place 1 drop into both eyes at bedtime.      ondansetron  (ZOFRAN -ODT) 4 MG disintegrating tablet Take 1 tablet (4 mg total) by mouth every 8 (eight) hours as needed for nausea or vomiting. 30 tablet 1   osimertinib  mesylate (TAGRISSO ) 80 MG tablet Take 1 tablet (80 mg total) by mouth daily. 30 tablet 4   prochlorperazine  (COMPAZINE ) 10 MG tablet Take 1 tablet (10 mg total) by mouth every 6 (six) hours as needed. 30 tablet 2   REPATHA  SURECLICK 140 MG/ML SOAJ INJECT 140 MG INTO THE SKIN EVERY 14 (FOURTEEN) DAYS. 6 mL 3   No current facility-administered medications for this visit.    VITAL SIGNS: LMP  (LMP Unknown)  There were no vitals filed for this visit.   Estimated body mass index is 22.95 kg/m as calculated from the following:   Height as of 05/28/23: 5' 4.5 (1.638 m).   Weight as of 05/28/23: 135 lb 12.8 oz (61.6 kg).   PERFORMANCE STATUS (ECOG) : 1 - Symptomatic but completely ambulatory   IMPRESSION: I connected by phone with [name] for follow-up. She expresses concerns about persistent headaches over the past several weeks. She has been trying to manage symptoms with Tylenol  as needed. Denies gait imbalance, dizziness, or changes in speech.  She mentions headaches are one sided when they occur and quite severe. Education provided on when to seek emergency assistance. Patient is realistic in her understanding. Will discuss with Oncology team regarding MRI of brain to rule out abnormalities. Patient verbalized understanding of plan. Despite headaches she feels she is doing well otherwise. Denies nausea, vomiting, or diarrhea. Is remaining active. Appetite is good. Occasional fatigue. Denies pain or discomfort.   Some ongoing insomnia. Requires occasional use of Xanax  at bedtime but does not take nightly.   We will continue to closely monitor and support as needed. All questions and support provided. Patient knows we are available as needed.    PLAN: Symptoms are well controlled.  Occasional use of Xanax  at bedtime.   Hyoscyamine  as needed  Senna-S 2 tablets at bedtime  Pending MRI of brain due  to acute onset of headaches. Discussed with Cassie, PA.  I will plan to see back in 6-8 weeks in collaboration with her other oncology appointments.    Patient expressed understanding and was in agreement with this plan. She also understands that She can call the clinic at any time with any questions, concerns, or complaints.     Visit consisted of counseling and education dealing with the complex and emotionally intense issues of symptom management and palliative care in the setting of serious and potentially life-threatening illness.Greater than 50%  of this time was spent counseling and coordinating care related to the above assessment and plan.  Levon Borer, AGPCNP-BC  Palliative Medicine Team/[facility]

## 2023-06-18 ENCOUNTER — Ambulatory Visit: Payer: Medicare HMO | Admitting: Adult Health

## 2023-06-18 ENCOUNTER — Other Ambulatory Visit (HOSPITAL_COMMUNITY): Payer: Self-pay

## 2023-06-21 ENCOUNTER — Encounter: Payer: Self-pay | Admitting: Nurse Practitioner

## 2023-06-21 ENCOUNTER — Inpatient Hospital Stay: Payer: Medicare HMO | Attending: Internal Medicine | Admitting: Nurse Practitioner

## 2023-06-21 DIAGNOSIS — R530 Neoplastic (malignant) related fatigue: Secondary | ICD-10-CM | POA: Diagnosis not present

## 2023-06-21 DIAGNOSIS — R519 Headache, unspecified: Secondary | ICD-10-CM

## 2023-06-21 DIAGNOSIS — C3491 Malignant neoplasm of unspecified part of right bronchus or lung: Secondary | ICD-10-CM

## 2023-06-21 DIAGNOSIS — Z515 Encounter for palliative care: Secondary | ICD-10-CM

## 2023-06-21 NOTE — Telephone Encounter (Signed)
 Oral Oncology Patient Advocate Encounter   Submitted grant closure form.   Form submitted via e-fax to [phone]   [employer] phone number [phone].   I will continue to check the status until final determination.   Estefana Moellers, CPhT-Adv Oncology Pharmacy Patient Advocate [facility] Direct Number: [phone]  Fax: [phone]

## 2023-06-22 ENCOUNTER — Telehealth: Payer: Self-pay | Admitting: Nurse Practitioner

## 2023-06-22 ENCOUNTER — Telehealth: Payer: Self-pay

## 2023-06-22 ENCOUNTER — Other Ambulatory Visit: Payer: Self-pay | Admitting: Physician Assistant

## 2023-06-22 ENCOUNTER — Encounter: Payer: Self-pay | Admitting: Physician Assistant

## 2023-06-22 ENCOUNTER — Encounter: Payer: Self-pay | Admitting: Internal Medicine

## 2023-06-22 DIAGNOSIS — C3491 Malignant neoplasm of unspecified part of right bronchus or lung: Secondary | ICD-10-CM

## 2023-06-22 DIAGNOSIS — R519 Headache, unspecified: Secondary | ICD-10-CM

## 2023-06-22 NOTE — Telephone Encounter (Signed)
 I connected with Allison [name] on 06/22/2023 at by telephone and verified that I am speaking with the correct person using two identifiers.   Other persons participating in the visit and their role in the encounter: N/A   Patient's location: Home  Provider's location: [facility]   Chief Complaint: Headaches  I connected by phone with Allison [name]. Patient states she is doing ok today. Does actively have a headache during our call. Denies dizziness, nausea, or gait disturbance.   Patient is aware I have discussed her acute headache symptoms and concerns with Cassie, PA. Brain MRI has been ordered and pending prior authorization. Once this process has been completed patient aware to expect a call to have STAT scan scheduled. Results will be called once available.   Education provided on when to seek emergency assistance. Allison [name] verbalized understanding and appreciation.

## 2023-06-22 NOTE — Telephone Encounter (Signed)
 Per Cassie, PA-  Stat Brain MRI ordered. Called central scheduling and scheduled patient for 1/4 @ 7 am with a 630 am arrival.  Informed patient to enter through the emergency room to check in and they will guide her to the MRI waiting room. Patient voiced understanding.

## 2023-06-23 ENCOUNTER — Ambulatory Visit (HOSPITAL_COMMUNITY)
Admission: RE | Admit: 2023-06-23 | Discharge: 2023-06-23 | Disposition: A | Discharge status: Home / Self Care | Payer: Medicare HMO | Source: Ambulatory Visit | Attending: Physician Assistant | Admitting: Physician Assistant

## 2023-06-23 ENCOUNTER — Encounter: Payer: Self-pay | Admitting: Physician Assistant

## 2023-06-23 DIAGNOSIS — C3491 Malignant neoplasm of unspecified part of right bronchus or lung: Secondary | ICD-10-CM | POA: Insufficient documentation

## 2023-06-23 DIAGNOSIS — R519 Headache, unspecified: Secondary | ICD-10-CM | POA: Insufficient documentation

## 2023-06-23 MED ORDER — HEPARIN SOD (PORK) LOCK FLUSH 100 UNIT/ML IV SOLN
500.0000 [IU] | INTRAVENOUS | Status: AC | PRN
  Administered 2023-06-23: 500 [IU]

## 2023-06-23 MED ORDER — GADOBUTROL 1 MMOL/ML IV SOLN
6.0000 mL | Freq: Once | INTRAVENOUS | Status: AC | PRN
  Administered 2023-06-23: 6 mL via INTRAVENOUS

## 2023-06-26 NOTE — Telephone Encounter (Signed)
 Oral Oncology Patient Advocate Encounter   Received notification re-enrollment for assistance for Tagrisso  through [employer] has been approved. Patient may continue to receive their medication at $0 from this program.    [employer] phone number [phone].   Effective dates: 06/25/23 through 06/18/24  I have spoken to the patient.  Estefana Moellers, CPhT-Adv Oncology Pharmacy Patient Advocate [facility] Direct Number: [phone]  Fax: [phone]

## 2023-07-06 ENCOUNTER — Other Ambulatory Visit: Payer: Self-pay | Admitting: Nurse Practitioner

## 2023-07-06 ENCOUNTER — Other Ambulatory Visit (HOSPITAL_COMMUNITY): Payer: Self-pay

## 2023-07-06 DIAGNOSIS — R0602 Shortness of breath: Secondary | ICD-10-CM

## 2023-07-06 DIAGNOSIS — C3490 Malignant neoplasm of unspecified part of unspecified bronchus or lung: Secondary | ICD-10-CM

## 2023-07-06 MED ORDER — IPRATROPIUM-ALBUTEROL 0.5-2.5 (3) MG/3ML IN SOLN
3.0000 mL | Freq: Four times a day (QID) | RESPIRATORY_TRACT | 3 refills | Status: DC | PRN
  Filled 2023-07-06: qty 360, 30d supply, fill #0

## 2023-07-16 ENCOUNTER — Other Ambulatory Visit (HOSPITAL_COMMUNITY): Payer: Self-pay

## 2023-07-16 ENCOUNTER — Telehealth: Payer: Self-pay | Admitting: Acute Care

## 2023-07-16 ENCOUNTER — Encounter: Payer: Self-pay | Admitting: Internal Medicine

## 2023-07-16 ENCOUNTER — Encounter: Payer: Self-pay | Admitting: Physician Assistant

## 2023-07-16 MED ORDER — BENZONATATE 100 MG PO CAPS
200.0000 mg | ORAL_CAPSULE | Freq: Three times a day (TID) | ORAL | 2 refills | Status: DC | PRN
  Filled 2023-07-16 (×2): qty 60, 10d supply, fill #0

## 2023-07-16 MED ORDER — DOXYCYCLINE HYCLATE 100 MG PO CAPS
100.0000 mg | ORAL_CAPSULE | Freq: Two times a day (BID) | ORAL | 0 refills | Status: DC
  Filled 2023-07-16 (×2): qty 14, 7d supply, fill #0

## 2023-07-16 MED ORDER — METHYLPREDNISOLONE 4 MG PO TBPK
ORAL_TABLET | ORAL | 0 refills | Status: DC
  Filled 2023-07-16 (×2): qty 21, 6d supply, fill #0

## 2023-07-16 NOTE — Telephone Encounter (Signed)
Called and  spoke with patient. Patient stated she is having nonproductive cough, chest tightness, and back pain that started yesterday.  Patient stated she feels the chest congestion, but can not cough it up.  Patient stated she is using Mucinex and Duo nebs with no help.  Patient stated she has had chills and sweats, but no fever.  Patient preferred pharmacy is [facility].  Message routed to Maralyn Sago, NP to advise

## 2023-07-16 NOTE — Telephone Encounter (Signed)
Patient is weak with shortness of breath. She has mucus in her lungs and having a hard time being able to cough it out. According to her apple watch she also has an increase in heart rate. Please call and advise [phone]

## 2023-07-16 NOTE — Telephone Encounter (Signed)
Called and spoke with patient.  [name],NP's recommendations given.  Understanding stated.  Patient agreed to go to UC, because at this time they're no openings in office today. Nothing further at this time.

## 2023-07-17 ENCOUNTER — Other Ambulatory Visit (HOSPITAL_COMMUNITY): Payer: Self-pay

## 2023-07-25 ENCOUNTER — Ambulatory Visit (HOSPITAL_COMMUNITY)
Admission: RE | Admit: 2023-07-25 | Discharge: 2023-07-25 | Disposition: A | Discharge status: Home / Self Care | Payer: Medicare HMO | Source: Ambulatory Visit | Attending: Internal Medicine | Admitting: Internal Medicine

## 2023-07-25 ENCOUNTER — Inpatient Hospital Stay: Payer: Medicare HMO | Attending: Internal Medicine

## 2023-07-25 ENCOUNTER — Other Ambulatory Visit: Payer: Self-pay | Admitting: Internal Medicine

## 2023-07-25 DIAGNOSIS — Z923 Personal history of irradiation: Secondary | ICD-10-CM | POA: Diagnosis not present

## 2023-07-25 DIAGNOSIS — C3411 Malignant neoplasm of upper lobe, right bronchus or lung: Secondary | ICD-10-CM | POA: Diagnosis present

## 2023-07-25 DIAGNOSIS — C3490 Malignant neoplasm of unspecified part of unspecified bronchus or lung: Secondary | ICD-10-CM

## 2023-07-25 DIAGNOSIS — Z7901 Long term (current) use of anticoagulants: Secondary | ICD-10-CM | POA: Insufficient documentation

## 2023-07-25 DIAGNOSIS — F419 Anxiety disorder, unspecified: Secondary | ICD-10-CM | POA: Insufficient documentation

## 2023-07-25 DIAGNOSIS — Z95828 Presence of other vascular implants and grafts: Secondary | ICD-10-CM

## 2023-07-25 DIAGNOSIS — J40 Bronchitis, not specified as acute or chronic: Secondary | ICD-10-CM | POA: Diagnosis not present

## 2023-07-25 DIAGNOSIS — Z9221 Personal history of antineoplastic chemotherapy: Secondary | ICD-10-CM | POA: Diagnosis not present

## 2023-07-25 DIAGNOSIS — R5383 Other fatigue: Secondary | ICD-10-CM | POA: Insufficient documentation

## 2023-07-25 DIAGNOSIS — Z86718 Personal history of other venous thrombosis and embolism: Secondary | ICD-10-CM | POA: Insufficient documentation

## 2023-07-25 DIAGNOSIS — Z79899 Other long term (current) drug therapy: Secondary | ICD-10-CM | POA: Insufficient documentation

## 2023-07-25 DIAGNOSIS — Z86711 Personal history of pulmonary embolism: Secondary | ICD-10-CM | POA: Insufficient documentation

## 2023-07-25 DIAGNOSIS — Z66 Do not resuscitate: Secondary | ICD-10-CM | POA: Insufficient documentation

## 2023-07-25 DIAGNOSIS — Z23 Encounter for immunization: Secondary | ICD-10-CM

## 2023-07-25 LAB — CBC WITH DIFFERENTIAL (CANCER CENTER ONLY)
Abs Immature Granulocytes: 0.02 10*3/uL (ref 0.00–0.07)
Basophils Absolute: 0 10*3/uL (ref 0.0–0.1)
Basophils Relative: 1 %
Eosinophils Absolute: 0.1 10*3/uL (ref 0.0–0.5)
Eosinophils Relative: 2 %
HCT: 38.1 % (ref 36.0–46.0)
Hemoglobin: 12.2 g/dL (ref 12.0–15.0)
Immature Granulocytes: 0 %
Lymphocytes Relative: 30 %
Lymphs Abs: 1.3 10*3/uL (ref 0.7–4.0)
MCH: 26.8 pg (ref 26.0–34.0)
MCHC: 32 g/dL (ref 30.0–36.0)
MCV: 83.7 fL (ref 80.0–100.0)
Monocytes Absolute: 0.5 10*3/uL (ref 0.1–1.0)
Monocytes Relative: 12 %
Neutro Abs: 2.5 10*3/uL (ref 1.7–7.7)
Neutrophils Relative %: 55 %
Platelet Count: 104 10*3/uL — ABNORMAL LOW (ref 150–400)
RBC: 4.55 MIL/uL (ref 3.87–5.11)
RDW: 13.6 % (ref 11.5–15.5)
WBC Count: 4.5 10*3/uL (ref 4.0–10.5)
nRBC: 0 % (ref 0.0–0.2)

## 2023-07-25 LAB — CMP (CANCER CENTER ONLY)
ALT: 16 U/L (ref 0–44)
AST: 19 U/L (ref 15–41)
Albumin: 3.6 g/dL (ref 3.5–5.0)
Alkaline Phosphatase: 66 U/L (ref 38–126)
Anion gap: 6 (ref 5–15)
BUN: 14 mg/dL (ref 8–23)
CO2: 27 mmol/L (ref 22–32)
Calcium: 9.1 mg/dL (ref 8.9–10.3)
Chloride: 106 mmol/L (ref 98–111)
Creatinine: 1.38 mg/dL — ABNORMAL HIGH (ref 0.44–1.00)
GFR, Estimated: 41 mL/min — ABNORMAL LOW (ref >60)
Glucose, Bld: 96 mg/dL (ref 70–99)
Potassium: 3.7 mmol/L (ref 3.5–5.1)
Sodium: 139 mmol/L (ref 135–145)
Total Bilirubin: 0.5 mg/dL (ref 0.0–1.2)
Total Protein: 6.4 g/dL — ABNORMAL LOW (ref 6.5–8.1)

## 2023-07-25 MED ORDER — HEPARIN SOD (PORK) LOCK FLUSH 100 UNIT/ML IV SOLN
INTRAVENOUS | Status: AC
  Filled 2023-07-25: qty 5

## 2023-07-25 MED ORDER — SODIUM CHLORIDE 0.9% FLUSH
10.0000 mL | Freq: Once | INTRAVENOUS | Status: AC
Start: 2023-07-25 — End: 2023-07-25
  Administered 2023-07-25: 10 mL

## 2023-07-25 MED ORDER — IOHEXOL 300 MG/ML  SOLN
80.0000 mL | Freq: Once | INTRAMUSCULAR | Status: AC | PRN
  Administered 2023-07-25: 80 mL via INTRAVENOUS

## 2023-07-25 MED ORDER — HEPARIN SOD (PORK) LOCK FLUSH 100 UNIT/ML IV SOLN
500.0000 [IU] | Freq: Once | INTRAVENOUS | Status: AC
  Administered 2023-07-25: 500 [IU] via INTRAVENOUS

## 2023-07-30 NOTE — Progress Notes (Signed)
Palliative Medicine [facility]  [phone] [phone]   Name: Allison [name] Date: 07/30/2023 MRN: [id number]  DOB: December 22, 1950  Patient Care Team: [name], MD as PCP - General (Internal Medicine) Little Ishikawa, MD as PCP - Cardiology (Cardiology) Antony Blackbird, MD as Consulting Physician (Radiation Oncology) Pickenpack-Cousar, Arty Baumgartner, NP as Nurse Practitioner (Nurse Practitioner) Syliva Overman, RN as Oncology Nurse Navigator (Oncology) Si Gaul, MD as Consulting Physician (Oncology) Kermit Balo, DO (Geriatric Medicine)   INTERVAL HISTORY: Allison [name] is a 73 y.o. female with stage IV non-small cell lung cancer (12/2017), hypertension, CVA, CAD, DVT/PE (on Lovenox), and GERD.  Palliative ask to see for symptom management and goals of care.  SOCIAL HISTORY:     reports that she has never smoked. She has never used smokeless tobacco. She reports that she does not currently use alcohol. She reports that she does not use drugs.  ADVANCE DIRECTIVES:  None on file   CODE STATUS: DNR  PAST MEDICAL HISTORY: Past Medical History:  Diagnosis Date   Anemia    Anxiety    Arthritis    Asthma    exercise induced   Coronary artery disease    Depression    PMH   Dyspnea    GERD (gastroesophageal reflux disease)    Glaucoma    History of radiation therapy 01/05/2021   IMRT right lung  11/24/2020-01/05/2021  Dr Antony Blackbird   Hypertension    lung ca dx'd 11/2017   right   Malignant pleural effusion    right   Nuclear sclerotic cataract of right eye 02/28/2021   Dr. Sallye Lat, cataract surgery February 2023   PONV (postoperative nausea and vomiting)    Pre-diabetes    Raynaud's disease    Raynaud's disease    Stroke (HCC) 01/2021   balance off, some express aphasia, weakness    ALLERGIES:  is allergic to hydrocodone-acetaminophen, penicillins, lactose, vicodin [hydrocodone-acetaminophen],  bacid, and other.  MEDICATIONS:  Current Outpatient Medications  Medication Sig Dispense Refill   acetaminophen (TYLENOL) 500 MG tablet Take 1,000 mg by mouth every 6 (six) hours as needed (pain).     ALPRAZolam (XANAX) 0.25 MG tablet Take 1 tablet (0.25 mg total) by mouth 2 (two) times daily as needed for anxiety. May take 2 tablets (0.5 mg total) at bedtime. 60 tablet 0   amLODipine (NORVASC) 5 MG tablet Take 1 tablet (5 mg total) by mouth daily. 90 tablet 3   benzonatate (TESSALON) 100 MG capsule Take 2 capsules (200 mg total) by mouth 3 (three) times daily as needed for cough. 60 capsule 2   carvedilol (COREG) 12.5 MG tablet Take 1 tablet (12.5 mg total) by mouth 2 (two) times daily with a meal. 180 tablet 3   CHELATED MAGNESIUM PO Take 200 mg by mouth daily.     Cholecalciferol (VITAMIN D) 125 MCG (5000 UT) CAPS Take 1 Capful by mouth daily.     docusate sodium (COLACE) 100 MG capsule Take 1 capsule (100 mg total) by mouth 2 (two) times daily. 60 capsule 5   dorzolamide-timolol (COSOPT) 22.3-6.8 MG/ML ophthalmic solution Place 1 drop into both eyes 2 (two) times daily.     doxycycline (VIBRAMYCIN) 100 MG capsule Take 1 capsule (100 mg total) by mouth 2 (two) times daily for 7 days. 14 capsule 0   enoxaparin (LOVENOX) 80 MG/0.8ML injection Inject 80 mg into the skin daily.     enoxaparin (LOVENOX)  80 MG/0.8ML injection Inject 0.8 mLs (80 mg total) into the skin every 12 (twelve) hours. (Patient not taking: Reported on 05/28/2023) 48 mL 11   ezetimibe (ZETIA) 10 MG tablet Take 10 mg by mouth daily.     FeFum-FePoly-FA-B Cmp-C-Biot (FOLIVANE-PLUS) CAPS Take 1 capsule by mouth in the morning. 90 capsule 0   FLUoxetine (PROZAC) 20 MG capsule Take 1 capsule (20 mg total) by mouth daily. 90 capsule 3   folic acid (FOLVITE) 1 MG tablet TAKE 1 TABLET BY MOUTH EVERY DAY 90 tablet 1   hyoscyamine (LEVSIN SL) 0.125 MG SL tablet Place 1-2 tablets (0.125-0.25mg ) under the tongue every 6 (six) hours as  needed for cramping. 30 tablet 3   ipratropium-albuterol (DUONEB) 0.5-2.5 (3) MG/3ML SOLN Take 3 mLs by nebulization every 6 (six) hours as needed (Asthma). 360 mL 3   isosorbide mononitrate (IMDUR) 30 MG 24 hr tablet Take 0.5 tablets (15 mg total) by mouth daily. 90 tablet 3   latanoprost (XALATAN) 0.005 % ophthalmic solution Place 1 drop into both eyes at bedtime.      methylPREDNISolone (MEDROL) 4 MG TBPK tablet Take as directed per 6 day dose pack instructions 21 tablet 0   ondansetron (ZOFRAN-ODT) 4 MG disintegrating tablet Take 1 tablet (4 mg total) by mouth every 8 (eight) hours as needed for nausea or vomiting. 30 tablet 1   osimertinib mesylate (TAGRISSO) 80 MG tablet Take 1 tablet (80 mg total) by mouth daily. 30 tablet 4   prochlorperazine (COMPAZINE) 10 MG tablet Take 1 tablet (10 mg total) by mouth every 6 (six) hours as needed. 30 tablet 2   REPATHA SURECLICK 140 MG/ML SOAJ Inject 140 mg into the skin every 14 (fourteen) days. 6 mL 3   No current facility-administered medications for this visit.    VITAL SIGNS: LMP  (LMP Unknown)  There were no vitals filed for this visit.   Estimated body mass index is 22.95 kg/m as calculated from the following:   Height as of 05/28/23: 5' 4.5" (1.638 m).   Weight as of 05/28/23: 135 lb 12.8 oz (61.6 kg).   PERFORMANCE STATUS (ECOG) : 1 - Symptomatic but completely ambulatory  Assessment NAD RRR Diminished bilaterally, cough AAO x3  Discussed the use of AI scribe software for clinical note transcription with the patient, who gave verbal consent to proceed.  IMPRESSION:  Allison [name] is a 73 year old female with history of stage IV lung cancer who presents with persistent cough and fatigue. Reports she is actively being treated by her PCP for bronchitis. Does not feel significant improvement. Patient seen by Dr. [name] today and offered additional support. States symptoms came after having a good day which allowed her to  participate in [facility] Day parade. Several days after this event is when she developed symptoms.  She has been experiencing a persistent cough and fatigue, which she attributes to bronchitis. The cough is described as 'nasty' with associated chest and back tightness, making it difficult to expectorate phlegm due to pain. The cough worsens with activity and talking, but she does not cough at night and is able to sleep well. Fatigue is constant and impacts her daily activities.  She is currently taking doxycycline. She has been on a prednisone dose pack, starting with six pills on the first day, which initially improved her symptoms, but the tightness and fatigue returned. She has three days left on the dose pack. She uses a nebulizer and has been using Mucinex  to help with phlegm expectoration. Hycodan has been prescribed for her cough, despite an allergy to Vicodin, and it helps but does not last the full four hours. She has not been taking Xanax due to concerns about interactions and prefers to avoid excessive sedation.  We discussed other non-pharmacological ways to manage her symptoms. She has been using a humidifier and shower steamers to help with her symptoms and reports that these measures help her sleep well. She is trying to stay hydrated by drinking water and is cautious about her exposure to cold weather, which she believes exacerbated her symptoms after attending a parade.  Denies pain, nausea, vomiting, constipation, or diarrhea. She expresses concerns with imaging finding with questionable disease progression versus acute reaction to her bronchial changes. This is being closely managed by Dr. [name] and patient verbalized understanding of plans in place. Allison [name] is remaining hopeful as she continues to take things one day at a time.   Emotional support provided as she discussed the passing of one of her closest friends Larita Fife) who was familiar to our team here at the cancer center also.    All questions answered and support provided.  Assessment and Plan  Bronchitis   Persistent cough and fatigue despite treatment with doxycycline and prednisone. Discussed the benefits of switching to a broader spectrum antibiotic, Levaquin, to target potential resistant bacteria.   -Start Levaquin per Dr. [name], take with food to minimize gastrointestinal side effects.   -Continue prednisone dose pack as directed.   -Resume Mucinex to aid in expectoration of phlegm.   -Continue nebulizer treatments as needed.   -Return in two weeks for follow-up, consider referral to pulmonologist if symptoms persist per oncology recommendations.     Health Maintenance  -Continue Xanax as needed for anxiety -Follow-up appointment scheduled for 08/17/2023. Sooner if needed. Patient knows to contact the clinic.  Patient expressed understanding and was in agreement with this plan. She also understands that She can call the clinic at any time with any questions, concerns, or complaints.     Visit consisted of counseling and education dealing with the complex and emotionally intense issues of symptom management and palliative care in the setting of serious and potentially life-threatening illness.  Willette Alma, AGPCNP-BC  Palliative Medicine Team/[facility]

## 2023-08-02 ENCOUNTER — Encounter: Payer: Self-pay | Admitting: Nurse Practitioner

## 2023-08-02 ENCOUNTER — Inpatient Hospital Stay (HOSPITAL_BASED_OUTPATIENT_CLINIC_OR_DEPARTMENT_OTHER): Payer: Medicare HMO | Admitting: Internal Medicine

## 2023-08-02 ENCOUNTER — Other Ambulatory Visit (HOSPITAL_COMMUNITY): Payer: Self-pay

## 2023-08-02 ENCOUNTER — Inpatient Hospital Stay (HOSPITAL_BASED_OUTPATIENT_CLINIC_OR_DEPARTMENT_OTHER): Payer: Medicare HMO | Admitting: Nurse Practitioner

## 2023-08-02 VITALS — BP 184/84 | HR 68 | Temp 97.3°F | Resp 17 | Wt 137.2 lb

## 2023-08-02 DIAGNOSIS — Z515 Encounter for palliative care: Secondary | ICD-10-CM

## 2023-08-02 DIAGNOSIS — F419 Anxiety disorder, unspecified: Secondary | ICD-10-CM | POA: Diagnosis not present

## 2023-08-02 DIAGNOSIS — R530 Neoplastic (malignant) related fatigue: Secondary | ICD-10-CM

## 2023-08-02 DIAGNOSIS — C3411 Malignant neoplasm of upper lobe, right bronchus or lung: Secondary | ICD-10-CM | POA: Diagnosis not present

## 2023-08-02 DIAGNOSIS — R052 Subacute cough: Secondary | ICD-10-CM

## 2023-08-02 DIAGNOSIS — C3491 Malignant neoplasm of unspecified part of right bronchus or lung: Secondary | ICD-10-CM | POA: Diagnosis not present

## 2023-08-02 MED ORDER — LEVOFLOXACIN 750 MG PO TABS
750.0000 mg | ORAL_TABLET | ORAL | 0 refills | Status: DC
  Filled 2023-08-02: qty 4, 8d supply, fill #0

## 2023-08-02 NOTE — Progress Notes (Signed)
[facility] Telephone:([phone]) [phone]   [phone]  OFFICE PROGRESS NOTE  [name], MD [address] [place] [address]  DIAGNOSIS: Stage IV (T2 a,N2, M1a) non-small cell lung cancer, adenocarcinoma diagnosed in July 2019 and presented with right upper lobe lung mass in addition to mediastinal lymphadenopathy as well as bilateral pulmonary nodules and malignant right pleural effusion.   Biomarker Findings Microsatellite status - Cannot Be Determined Tumor Mutational Burden - Cannot Be Determined Genomic Findings For a complete list of the genes assayed, please refer to the Appendix. EGFR exon 19 deletion ([id number]) TP53 Y220C 7 Disease relevant genes with no reportable alterations: KRAS, ALK, BRAF, MET, RET, ERBB2, ROS1    PRIOR THERAPY:  1) Status post right Pleurx catheter placement by Dr. Donata Clay for drainage of malignant right pleural effusion. 2) palliative radiotherapy to the enlarging right upper lobe lung mass and mediastinum under the care of Dr. Roselind Messier expected to be completed on January 05, 2021. 3) Systemic chemotherapy with carboplatin for AUC of 5 and Alimta 500 Mg/M2 every 3 weeks.  First dose 03/17/2021.  The patient will also continue her current treatment with Tagrisso 80 mg p.o. daily.  She is status post 26 cycles. Starting from cycle #7, she will be on Alimta only 400 mg/m2.  Her treatment with Alimta has been on hold for several months now.  She is currently on treatment with single agent Tagrisso 80 mg p.o. daily    CURRENT THERAPY: Tagrisso 80 mg p.o. daily.  First dose was given on 01/29/2018.  Status post 63 months of treatment.  INTERVAL HISTORY: Allison [name] 72 y.o. female returns to the clinic today for follow-up visit accompanied by her boyfriend, [name].  Her daughter was available by phone during the visit.  Discussed the use of AI scribe software for clinical note transcription with the patient, who  gave verbal consent to proceed.  History of Present Illness   Allison [name] is a 73 year old female with stage four non-small cell lung cancer adenocarcinoma who presents with persistent cough and chest pain. She is accompanied by her Boyfriend and daughter, who is staying with her and assisting with her care.  She has a history of stage four non-small cell lung cancer adenocarcinoma diagnosed in July 2019, with an EGFR mutation positive for deletion in exon 19. She has been on Tagrisso since August 2019. After disease progression, she received carboplatin and Alimta, followed by Alimta alone in addition to Tagrisso, which was later discontinued due to side effects. She continues on Tagrisso monotherapy.  She has a persistent cough that began on July 09, 2023, leading to a diagnosis of bronchitis by her primary care doctor. She was initially treated with a 4 mg prednisone pack, which was later increased to 10 mg, along with Hycodan cough syrup. Despite treatment, she experiences chest and back pain, difficulty coughing up phlegm, and pain when attempting to cough. She uses a nebulizer to aid in expectoration but finds it ineffective. The cough worsens with talking and movement. She was prescribed doxycycline for seven days, taken twice daily, but did not notice significant improvement in her symptoms, although it caused stomach queasiness. No fever, chills, or flu symptoms, and no one else in her household is sick. Her daughter reports that she has no energy and struggles to talk, with symptoms persisting despite rest. The daughter is concerned about a possible respiratory infection or tuberculosis, given the ongoing  symptoms and recent exposure at a public event.  A recent scan showed no cancer growth but indicated some density in the area previously treated with radiation, which could be inflammation vs. Lymphangitic tumor spread. She is currently on a tapering dose of prednisone, with three days  remaining.  She lives with her daughter and granddaughter, who are currently staying with her.        MEDICAL HISTORY: Past Medical History:  Diagnosis Date   Anemia    Anxiety    Arthritis    Asthma    exercise induced   Coronary artery disease    Depression    PMH   Dyspnea    GERD (gastroesophageal reflux disease)    Glaucoma    History of radiation therapy 01/05/2021   IMRT right lung  11/24/2020-01/05/2021  Dr Antony Blackbird   Hypertension    lung ca dx'd 11/2017   right   Malignant pleural effusion    right   Nuclear sclerotic cataract of right eye 02/28/2021   Dr. Sallye Lat, cataract surgery February 2023   PONV (postoperative nausea and vomiting)    Pre-diabetes    Raynaud's disease    Raynaud's disease    Stroke (HCC) 01/2021   balance off, some express aphasia, weakness    ALLERGIES:  is allergic to hydrocodone-acetaminophen, penicillins, lactose, vicodin [hydrocodone-acetaminophen], bacid, and other.  MEDICATIONS:  Current Outpatient Medications  Medication Sig Dispense Refill   acetaminophen (TYLENOL) 500 MG tablet Take 1,000 mg by mouth every 6 (six) hours as needed (pain).     ALPRAZolam (XANAX) 0.25 MG tablet Take 1 tablet (0.25 mg total) by mouth 2 (two) times daily as needed for anxiety. May take 2 tablets (0.5 mg total) at bedtime. 60 tablet 0   amLODipine (NORVASC) 5 MG tablet Take 1 tablet (5 mg total) by mouth daily. 90 tablet 3   benzonatate (TESSALON) 100 MG capsule Take 2 capsules (200 mg total) by mouth 3 (three) times daily as needed for cough. 60 capsule 2   carvedilol (COREG) 12.5 MG tablet Take 1 tablet (12.5 mg total) by mouth 2 (two) times daily with a meal. 180 tablet 3   CHELATED MAGNESIUM PO Take 200 mg by mouth daily.     Cholecalciferol (VITAMIN D) 125 MCG (5000 UT) CAPS Take 1 Capful by mouth daily.     docusate sodium (COLACE) 100 MG capsule Take 1 capsule (100 mg total) by mouth 2 (two) times daily. 60 capsule 5    dorzolamide-timolol (COSOPT) 22.3-6.8 MG/ML ophthalmic solution Place 1 drop into both eyes 2 (two) times daily.     doxycycline (VIBRAMYCIN) 100 MG capsule Take 1 capsule (100 mg total) by mouth 2 (two) times daily for 7 days. 14 capsule 0   enoxaparin (LOVENOX) 80 MG/0.8ML injection Inject 80 mg into the skin daily.     enoxaparin (LOVENOX) 80 MG/0.8ML injection Inject 0.8 mLs (80 mg total) into the skin every 12 (twelve) hours. (Patient not taking: Reported on 05/28/2023) 48 mL 11   ezetimibe (ZETIA) 10 MG tablet Take 10 mg by mouth daily.     FeFum-FePoly-FA-B Cmp-C-Biot (FOLIVANE-PLUS) CAPS Take 1 capsule by mouth in the morning. 90 capsule 0   FLUoxetine (PROZAC) 20 MG capsule Take 1 capsule (20 mg total) by mouth daily. 90 capsule 3   folic acid (FOLVITE) 1 MG tablet TAKE 1 TABLET BY MOUTH EVERY DAY 90 tablet 1   hyoscyamine (LEVSIN SL) 0.125 MG SL tablet Place 1-2 tablets (0.125-0.25mg ) under  the tongue every 6 (six) hours as needed for cramping. 30 tablet 3   ipratropium-albuterol (DUONEB) 0.5-2.5 (3) MG/3ML SOLN Take 3 mLs by nebulization every 6 (six) hours as needed (Asthma). 360 mL 3   isosorbide mononitrate (IMDUR) 30 MG 24 hr tablet Take 0.5 tablets (15 mg total) by mouth daily. 90 tablet 3   latanoprost (XALATAN) 0.005 % ophthalmic solution Place 1 drop into both eyes at bedtime.      methylPREDNISolone (MEDROL) 4 MG TBPK tablet Take as directed per 6 day dose pack instructions 21 tablet 0   ondansetron (ZOFRAN-ODT) 4 MG disintegrating tablet Take 1 tablet (4 mg total) by mouth every 8 (eight) hours as needed for nausea or vomiting. 30 tablet 1   osimertinib mesylate (TAGRISSO) 80 MG tablet Take 1 tablet (80 mg total) by mouth daily. 30 tablet 4   prochlorperazine (COMPAZINE) 10 MG tablet Take 1 tablet (10 mg total) by mouth every 6 (six) hours as needed. 30 tablet 2   REPATHA SURECLICK 140 MG/ML SOAJ Inject 140 mg into the skin every 14 (fourteen) days. 6 mL 3   No current  facility-administered medications for this visit.    SURGICAL HISTORY:  Past Surgical History:  Procedure Laterality Date   ABDOMINAL HYSTERECTOMY     partial   BRONCHIAL BIOPSY  04/21/2021   Procedure: BRONCHIAL BIOPSIES;  Surgeon: Josephine Igo, DO;  Location: [facility];  Service: Pulmonary;;   BRONCHIAL BRUSHINGS  04/21/2021   Procedure: BRONCHIAL BRUSHINGS;  Surgeon: Josephine Igo, DO;  Location: [facility];  Service: Pulmonary;;   BRONCHIAL NEEDLE ASPIRATION BIOPSY  04/21/2021   Procedure: BRONCHIAL NEEDLE ASPIRATION BIOPSIES;  Surgeon: Josephine Igo, DO;  Location: [facility];  Service: Pulmonary;;   CHEST TUBE INSERTION Right 01/01/2018   Procedure: INSERTION PLEURAL DRAINAGE CATHETER;  Surgeon: Kerin Perna, MD;  Location: [facility] OR;  Service: Thoracic;  Laterality: Right;   CHEST TUBE INSERTION  04/21/2021   Procedure: CHEST TUBE INSERTION;  Surgeon: Josephine Igo, DO;  Location: [facility];  Service: Pulmonary;;   COLONOSCOPY     CORONARY STENT INTERVENTION N/A 06/16/2019   Procedure: CORONARY STENT INTERVENTION;  Surgeon: Corky Crafts, MD;  Location: [facility];  Service: Cardiovascular;  Laterality: N/A;   DILATION AND CURETTAGE OF UTERUS     EYE SURGERY     due to Glaucoma   IR IMAGING GUIDED PORT INSERTION  03/22/2021   IR PORT REPAIR CENTRAL VENOUS ACCESS DEVICE  04/15/2021   LEFT HEART CATH AND CORONARY ANGIOGRAPHY N/A 06/16/2019   Procedure: LEFT HEART CATH AND CORONARY ANGIOGRAPHY;  Surgeon: Corky Crafts, MD;  Location: [facility] INVASIVE CV LAB;  Service: Cardiovascular;  Laterality: N/A;   REMOVAL OF PLEURAL DRAINAGE CATHETER Right 11/07/2018   Procedure: REMOVAL OF PLEURAL DRAINAGE CATHETER;  Surgeon: Kerin Perna, MD;  Location: [facility] OR;  Service: Thoracic;  Laterality: Right;   ROTATOR CUFF REPAIR     TUBAL LIGATION     VIDEO BRONCHOSCOPY WITH ENDOBRONCHIAL NAVIGATION Bilateral 04/21/2021   Procedure: VIDEO BRONCHOSCOPY WITH  ENDOBRONCHIAL NAVIGATION;  Surgeon: Josephine Igo, DO;  Location: [facility];  Service: Pulmonary;  Laterality: Bilateral;  ION   WISDOM TOOTH EXTRACTION      REVIEW OF SYSTEMS:  Constitutional: positive for fatigue Eyes: negative Ears, nose, mouth, throat, and face: negative Respiratory: positive for cough and dyspnea on exertion Cardiovascular: negative Gastrointestinal: negative Genitourinary:negative Integument/breast: negative Hematologic/lymphatic: negative Musculoskeletal:negative Neurological: negative Behavioral/Psych: negative Endocrine: negative  Allergic/Immunologic: negative   PHYSICAL EXAMINATION: General appearance: alert, cooperative, appears stated age, fatigued, and no distress Head: Normocephalic, without obvious abnormality, atraumatic Neck: no adenopathy, no JVD, supple, symmetrical, trachea midline, and thyroid not enlarged, symmetric, no tenderness/mass/nodules Lymph nodes: Cervical, supraclavicular, and axillary nodes normal. Resp: clear to auscultation bilaterally Back: symmetric, no curvature. ROM normal. No CVA tenderness. Cardio: regular rate and rhythm, S1, S2 normal, no murmur, click, rub or gallop GI: soft, non-tender; bowel sounds normal; no masses,  no organomegaly Extremities: extremities normal, atraumatic, no cyanosis or edema Neurologic: Alert and oriented X 3, normal strength and tone. Normal symmetric reflexes. Normal coordination and gait  ECOG PERFORMANCE STATUS: 1 - Symptomatic but completely ambulatory  Blood pressure (!) 184/84, pulse 68, temperature (!) 97.3 F (36.3 C), temperature source Temporal, resp. rate 17, weight 137 lb 3.2 oz (62.2 kg), SpO2 100%.  LABORATORY DATA: Lab Results  Component Value Date   WBC 4.5 07/25/2023   HGB 12.2 07/25/2023   HCT 38.1 07/25/2023   MCV 83.7 07/25/2023   PLT 104 (L) 07/25/2023      Chemistry      Component Value Date/Time   NA 139 07/25/2023 0805   NA 140 03/04/2021 1516   K 3.7  07/25/2023 0805   CL 106 07/25/2023 0805   CO2 27 07/25/2023 0805   BUN 14 07/25/2023 0805   BUN 21 03/04/2021 1516   CREATININE 1.38 (H) 07/25/2023 0805      Component Value Date/Time   CALCIUM 9.1 07/25/2023 0805   ALKPHOS 66 07/25/2023 0805   AST 19 07/25/2023 0805   ALT 16 07/25/2023 0805   BILITOT 0.5 07/25/2023 0805       RADIOGRAPHIC STUDIES: CT CHEST ABDOMEN PELVIS W CONTRAST Result Date: 08/01/2023 CLINICAL DATA:  Non-small cell lung cancer.  * Tracking Code: BO * EXAM: CT CHEST, ABDOMEN, AND PELVIS WITH CONTRAST TECHNIQUE: Multidetector CT imaging of the chest, abdomen and pelvis was performed following the standard protocol during bolus administration of intravenous contrast. RADIATION DOSE REDUCTION: This exam was performed according to the departmental dose-optimization program which includes automated exposure control, adjustment of the mA and/or kV according to patient size and/or use of iterative reconstruction technique. CONTRAST:  80mL OMNIPAQUE IOHEXOL 300 MG/ML  SOLN COMPARISON:  04/25/2023. FINDINGS: CT CHEST FINDINGS Cardiovascular: Right IJ Port-A-Cath terminates in the right atrium. Atherosclerotic calcification of the aorta and coronary arteries. Heart is at the upper limits of normal in size. Left ventricle appears somewhat dilated. No pericardial effusion. Mediastinum/Nodes: No pathologically enlarged mediastinal, hilar or axillary lymph nodes. Soft tissue thickening in the right hilum. Esophagus is grossly unremarkable. Lungs/Pleura: Post treatment consolidation, bronchiectasis and volume loss in the right upper lobe and perihilar right lower lobe. Septal thickening and nodularity throughout the right hemithorax, new or progressive from 04/25/2023. Thin rind of pleural fluid in the right hemithorax, similar. No suspicious pulmonary nodules in the left lung. Trace left pleural fluid. Airway is unremarkable. Musculoskeletal: Degenerative changes in the spine. No worrisome  lytic or sclerotic lesions. CT ABDOMEN PELVIS FINDINGS Hepatobiliary: Liver and gallbladder are unremarkable. No biliary ductal dilatation. Pancreas: Negative. Spleen: Negative. Adrenals/Urinary Tract: Adrenal glands are unremarkable. Mild renal cortical scarring bilaterally. Subcentimeter low-attenuation lesion in the right kidney, too small to characterize. No specific follow-up necessary. Ureters are decompressed. Bladder is low in volume. Stomach/Bowel: Stomach, small bowel and colon are unremarkable. Appendix is not readily visualized. Vascular/Lymphatic: Atherosclerotic calcification of the aorta. No pathologically enlarged lymph nodes. Reproductive: Hysterectomy.  No adnexal mass. Other: Trace pelvic free fluid. Mesenteries and peritoneum are unremarkable. Musculoskeletal: Osteopenia. Degenerative changes in the spine. Grade 1 anterolisthesis of L5 on S1. IMPRESSION: 1. Post radiation consolidation in the upper right hemithorax. New or increasing septal thickening and perilymphatic nodularity throughout the right hemithorax, highly worrisome for lymphangitic carcinomatosis. 2. Thin rind of pleural fluid in the right hemithorax, stable. Trace left pleural effusion. 3. Aortic atherosclerosis (ICD10-I70.0). Coronary artery calcification. Electronically Signed   By: Leanna Battles M.D.   On: 08/01/2023 16:59    ASSESSMENT AND PLAN: This is a very pleasant 73 years old never smoker African-American female recently with a stage IV non-small cell lung cancer, adenocarcinoma with positive EGFR mutation with deletion in exon 19. The patient was started on treatment with Tagrisso 80 mg p.o. daily status post 35 months of treatment. She has been tolerating this treatment well with no concerning adverse effects except for intermittent diarrhea. She had repeat CT scan of the chest, abdomen pelvis performed recently.  I personally and independently reviewed the scans and discussed the results with the patient and  her boyfriend today. Unfortunately the CT scan showed interval progression of the right apical lung mass in addition to progression of mediastinal lymphadenopathy concerning for worsening of her disease. She has no actionable resistant mutation on the molecular studies performed by [facility]. The patient continued her current treatment with Tagrisso and tolerating it fairly well. 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 Roselind Messier completed January 05, 2021. The patient had significant opacities in her lung that was initially thought to be secondary to radiation treatment versus Tagrisso induced pneumonitis versus lymphangitic spread of the tumor.  She was treated with high-dose taper regiment of prednisone Repeat imaging studies after the palliative radiotherapy showed evidence for disease progression. Her molecular studies by [facility] recently showed no new resistant mutation.  After discussion of her treatment options including palliative care and hospice referral versus palliative systemic chemotherapy the patient was interested in proceeding palliative systemic chemotherapy.  She started  palliative systemic chemotherapy with carboplatin for AUC of 5 and Alimta 500 Mg/M2 every 3 weeks.  Status post 26 cycles.  Starting from cycle #7 the patient is on treatment with single agent Alimta every 3 weeks. I did not add a Avastin to her treatment because of the recent stroke.  She also continued her treatment with Tagrisso at the same time.  Her treatment with Alimta was discontinued more than 12 months ago secondary to intolerance and fatigue. She has been tolerating her treatment with Tagrisso fairly well. She had repeat CT scan of the chest performed recently.  I personally independently reviewed the scan images and discussed the result with the patient and her family.  Her scan showed the  postradiation consolidation in the upper right hemothorax with new or increasing septal thickening and perilymphatic nodularity throughout the right hemithorax suspicious for lymphangitic spread but no other clear evidence for disease progression.    Stage IV Non-Small Cell Lung Cancer (NSCLC) with EGFR Mutation Diagnosed in July 2019 with stage IV NSCLC adenocarcinoma, positive for EGFR mutation with deletion in exon 19. Initially treated with Tagrisso since August 2019. Disease progression led to the addition of carboplatin and Alimta, followed by Alimta alone with Tagrisso. Currently on Tagrisso monotherapy. Recent scan shows no new cancer growth but some density in the area of previous radiation, likely inflammation but  lymphogenic spread cannot be ruled out. Explained that this is most likely inflammation but lymphogenic spread couldn't be excluded. Discussed the importance of continuing Tagrisso due to its effectiveness and the risks of changing treatment prematurely. - Continue Tagrisso - Monitor for clinical improvement over the next 1-2 weeks - If no improvement, consider bronchoscopy or repeat PET scan  Inflammation in Lung Post-Radiation Inflammation noted in the area of previous radiation on recent scan. Radiologist suggests possible lymphogenic spread due to cancer history, but inflammation is more likely. Treatment with steroids and antibiotics is ongoing. Discussed the possibility of needing a bronchoscopy or repeat PET scan if no improvement is seen. Further evaluation by a pulmonologist may be necessary if inflammation does not improve. - Continue prednisone - Prescribe Levaquin for 1 week - Monitor for clinical improvement over the next 1-2 weeks - If no improvement, consider bronchoscopy or repeat PET scan  Bronchitis Diagnosed with bronchitis by primary care physician on July 09, 2023. Treated with a prednisone pack and doxycycline for 7 days without significant improvement.  Symptoms include persistent cough, chest and back pain, and difficulty expectorating phlegm. Current treatment includes a higher dose of prednisone and Hycodan cough syrup. Explained that Levaquin is a stronger antibiotic often used for pneumonia and may be more effective. - Continue current prednisone regimen - Prescribe Levaquin for 1 week - Use nebulizer as needed for symptom relief - Follow up in 2 weeks to assess clinical improvement  Follow-up - Follow up in 2 weeks to assess clinical improvement - If no improvement, consider bronchoscopy or repeat PET scan - Contact pulmonologist for further evaluation and management - Send prescription for Levaquin to [facility] pharmacy.   The patient was advised to call immediately if she has any other concerning symptoms in the interval. The patient voices understanding of current disease status and treatment options and is in agreement with the current care plan.  All questions were answered. The patient knows to call the clinic with any problems, questions or concerns. We can certainly see the patient much sooner if necessary.  he total time spent in the appointment was 55 minutes.  Disclaimer: This note was dictated with voice recognition software. Similar sounding words can inadvertently be transcribed and may not be corrected upon review.

## 2023-08-03 ENCOUNTER — Telehealth: Payer: Self-pay | Admitting: *Deleted

## 2023-08-03 NOTE — Telephone Encounter (Signed)
Left voicemail for patient to call to schedule appt with Kandice Robinsons, NP per her request. She can discuss pulmonary rehab referral at this visit and then refer to Dr [name] for further follow up.

## 2023-08-06 ENCOUNTER — Telehealth: Payer: Self-pay | Admitting: Acute Care

## 2023-08-06 ENCOUNTER — Ambulatory Visit (INDEPENDENT_AMBULATORY_CARE_PROVIDER_SITE_OTHER): Payer: Medicare HMO

## 2023-08-06 ENCOUNTER — Other Ambulatory Visit: Payer: Self-pay | Admitting: Physician Assistant

## 2023-08-06 ENCOUNTER — Other Ambulatory Visit: Payer: Self-pay

## 2023-08-06 ENCOUNTER — Other Ambulatory Visit (HOSPITAL_COMMUNITY): Payer: Self-pay

## 2023-08-06 ENCOUNTER — Other Ambulatory Visit: Payer: Self-pay | Admitting: Acute Care

## 2023-08-06 ENCOUNTER — Encounter: Payer: Self-pay | Admitting: Acute Care

## 2023-08-06 ENCOUNTER — Ambulatory Visit: Payer: Medicare HMO | Admitting: Acute Care

## 2023-08-06 VITALS — BP 173/94 | HR 78 | Temp 96.9°F | Ht 64.0 in | Wt 134.4 lb

## 2023-08-06 DIAGNOSIS — C3491 Malignant neoplasm of unspecified part of right bronchus or lung: Secondary | ICD-10-CM

## 2023-08-06 DIAGNOSIS — R0600 Dyspnea, unspecified: Secondary | ICD-10-CM

## 2023-08-06 DIAGNOSIS — J209 Acute bronchitis, unspecified: Secondary | ICD-10-CM | POA: Diagnosis not present

## 2023-08-06 DIAGNOSIS — J440 Chronic obstructive pulmonary disease with acute lower respiratory infection: Secondary | ICD-10-CM | POA: Diagnosis not present

## 2023-08-06 DIAGNOSIS — C3490 Malignant neoplasm of unspecified part of unspecified bronchus or lung: Secondary | ICD-10-CM

## 2023-08-06 DIAGNOSIS — R058 Other specified cough: Secondary | ICD-10-CM

## 2023-08-06 LAB — NITRIC OXIDE: Nitric Oxide: 18

## 2023-08-06 MED ORDER — BEVESPI AEROSPHERE 9-4.8 MCG/ACT IN AERO: 2.0000 | INHALATION_SPRAY | Freq: Every day | RESPIRATORY_TRACT | 0 refills | Status: DC

## 2023-08-06 MED ORDER — IPRATROPIUM-ALBUTEROL 0.5-2.5 (3) MG/3ML IN SOLN
3.0000 mL | Freq: Once | RESPIRATORY_TRACT | Status: AC
  Administered 2023-08-06: 3 mL via RESPIRATORY_TRACT

## 2023-08-06 NOTE — Patient Instructions (Addendum)
It is good to see you today. We have done a FENO today in the office. This is 18, which is within normal limits. We will do a Duo Neb today in the office to see if this helps with your shortness of breath.  We will do a CXR. ( Stat) I will call you with results. I have messaged Dr. [name] to have him take a look at your last CT Chest . I will call you and let you know his thoughts. Finish Levaquin Dr. [name] prescribed.  Continue Mucinex 1200 mg ( liquid of tablets) Use Flutter valve or chest vest to help mobilize the mucus so you can cough it up.  Continue using your Hycodan cough syrup as you have been doing.  Voice rest. Warm drinks like hot tea and honey. Follow up visit with Maralyn Sago NP or any other apps in 2 weeks to ensure you are responding to treatment Call if you need [place] sooner  Please contact office for sooner follow up if symptoms do not improve or worsen or seek emergency care

## 2023-08-06 NOTE — Progress Notes (Addendum)
History of Present Illness Allison [name] is a 73 y.o. female former smoker with stage 4 adenocarcinoma of the right lung, (12/2017), on chemo, hypertension, CVA, CAD, DVT/PE (on Lovenox),iron deficiency anemia,  and GERD. She is followed by Dr. [name].    08/06/2023 Pt. Presents for an acute visit. She is having a slow to resolve flare of her bronchitis. She states she has been sick since 07/09/2023.She was at the [facility] parade, and she thinks the cold air is what started he flare. She was seen by her PCP on 07/16/2023. She tested negative for Flu A, Covid, and RSV. She was treated with a  prednisone taper, and  Doxycycline. She states she did not feel any better after treatment. She did have a CT Chest 07/25/2023 per oncology follow up for satbility , which showed inflammatory changes and and ? Of lymphangitic carcinomatosis. She saw Dr. [name] again, and she had a repeat prednisone taper. She has seen Dr. [name] since, and he placed her on Levaquin 08/02/2023 which she takes every other day x 4 tablets. She states she feels no better on the Levaquin, but she has had only 2 doses. She states her appetite is poor . She states her secretions are clear, but thicker. She is doing Muicinex with her Hycodan cough syrup. She is using this every 4 hours. She is short of breath. She is using her nebulizer twice daily. She is not using her flutter valve or chest vest. No fever, and she has a very dry cough. She states her chest is tight, but it does not hurt. Her BP is elevated today, and her sats are 97% today. She states she does have wheezing on expiration when she is laying down only. She finished her last pred taper 2 days ago.  Dr. [name] is unsure if the changes seen on CT are inflammatory vs recurrence of cancer. I will have Dr. [name] look at the scan and weigh in .  CXR today also questions lymphangitic spread of tumor. I will await Dr. Kavin Leech read.  FENO today was 18 ppb. Eosinophils on 07/25/2023 were 100,  last prednisone taper was completed 2/15. She had DuoNeb today in the Office x 1.She states she is using this twice daily at home, but had not used it this morning. We will do a CXR to ensure there have been no changes since her last scan as she is slow to improve.   Test Results: CXR 08/06/2023 Stable consolidation right upper hemithorax. Similar opacities throughout the right mid and lower lung which may represent lymphangitic spread of tumor. Small right pleural effusion. No pneumothorax. Thoracic spine degenerative changes. Clear left lung.   IMPRESSION: 1. Stable consolidation right upper hemithorax. 2. Similar opacities throughout the right mid and lower lung which may represent lymphangitic spread of tumor. 3. Small right pleural effusion.  She is on Lovenox, and she has not missed any doses. I was concerned if she had missed a dose, she may have a PE, but as she has been compliant, I am less concerned.      Latest Ref Rng & Units 07/25/2023    8:05 AM 04/25/2023    7:40 AM 01/22/2023    7:38 AM  CBC  WBC 4.0 - 10.5 K/uL 4.5  3.6  2.8   Hemoglobin 12.0 - 15.0 g/dL 16.1  09.6  04.5   Hematocrit 36.0 - 46.0 % 38.1  36.9  35.1   Platelets 150 - 400 K/uL 104  145  149        Latest Ref Rng & Units 07/25/2023    8:05 AM 04/25/2023    7:40 AM 01/22/2023    7:38 AM  BMP  Glucose 70 - 99 mg/dL 96  91  91   BUN 8 - 23 mg/dL 14  18  16    Creatinine 0.44 - 1.00 mg/dL 1.61  0.96  0.45   Sodium 135 - 145 mmol/L 139  138  141   Potassium 3.5 - 5.1 mmol/L 3.7  3.8  4.0   Chloride 98 - 111 mmol/L 106  105  108   CO2 22 - 32 mmol/L 27  27  27    Calcium 8.9 - 10.3 mg/dL 9.1  9.4  9.2     BNP    Component Value Date/Time   BNP 35.4 02/07/2021 1640    ProBNP No results found for: "PROBNP"  PFT No results found for: "FEV1PRE", "FEV1POST", "FVCPRE", "FVCPOST", "TLC", "DLCOUNC", "PREFEV1FVCRT", "PSTFEV1FVCRT"  CT CHEST ABDOMEN PELVIS W CONTRAST Result Date: 08/01/2023 CLINICAL DATA:   Non-small cell lung cancer.  * Tracking Code: BO * EXAM: CT CHEST, ABDOMEN, AND PELVIS WITH CONTRAST TECHNIQUE: Multidetector CT imaging of the chest, abdomen and pelvis was performed following the standard protocol during bolus administration of intravenous contrast. RADIATION DOSE REDUCTION: This exam was performed according to the departmental dose-optimization program which includes automated exposure control, adjustment of the mA and/or kV according to patient size and/or use of iterative reconstruction technique. CONTRAST:  80mL OMNIPAQUE IOHEXOL 300 MG/ML  SOLN COMPARISON:  04/25/2023. FINDINGS: CT CHEST FINDINGS Cardiovascular: Right IJ Port-A-Cath terminates in the right atrium. Atherosclerotic calcification of the aorta and coronary arteries. Heart is at the upper limits of normal in size. Left ventricle appears somewhat dilated. No pericardial effusion. Mediastinum/Nodes: No pathologically enlarged mediastinal, hilar or axillary lymph nodes. Soft tissue thickening in the right hilum. Esophagus is grossly unremarkable. Lungs/Pleura: Post treatment consolidation, bronchiectasis and volume loss in the right upper lobe and perihilar right lower lobe. Septal thickening and nodularity throughout the right hemithorax, new or progressive from 04/25/2023. Thin rind of pleural fluid in the right hemithorax, similar. No suspicious pulmonary nodules in the left lung. Trace left pleural fluid. Airway is unremarkable. Musculoskeletal: Degenerative changes in the spine. No worrisome lytic or sclerotic lesions. CT ABDOMEN PELVIS FINDINGS Hepatobiliary: Liver and gallbladder are unremarkable. No biliary ductal dilatation. Pancreas: Negative. Spleen: Negative. Adrenals/Urinary Tract: Adrenal glands are unremarkable. Mild renal cortical scarring bilaterally. Subcentimeter low-attenuation lesion in the right kidney, too small to characterize. No specific follow-up necessary. Ureters are decompressed. Bladder is low in  volume. Stomach/Bowel: Stomach, small bowel and colon are unremarkable. Appendix is not readily visualized. Vascular/Lymphatic: Atherosclerotic calcification of the aorta. No pathologically enlarged lymph nodes. Reproductive: Hysterectomy.  No adnexal mass. Other: Trace pelvic free fluid. Mesenteries and peritoneum are unremarkable. Musculoskeletal: Osteopenia. Degenerative changes in the spine. Grade 1 anterolisthesis of L5 on S1. IMPRESSION: 1. Post radiation consolidation in the upper right hemithorax. New or increasing septal thickening and perilymphatic nodularity throughout the right hemithorax, highly worrisome for lymphangitic carcinomatosis. 2. Thin rind of pleural fluid in the right hemithorax, stable. Trace left pleural effusion. 3. Aortic atherosclerosis (ICD10-I70.0). Coronary artery calcification. Electronically Signed   By: Leanna Battles M.D.   On: 08/01/2023 16:59     Past medical hx Past Medical History:  Diagnosis Date   Anemia    Anxiety    Arthritis    Asthma    exercise induced  Coronary artery disease    Depression    PMH   Dyspnea    GERD (gastroesophageal reflux disease)    Glaucoma    History of radiation therapy 01/05/2021   IMRT right lung  11/24/2020-01/05/2021  Dr Antony Blackbird   Hypertension    lung ca dx'd 11/2017   right   Malignant pleural effusion    right   Nuclear sclerotic cataract of right eye 02/28/2021   Dr. Sallye Lat, cataract surgery February 2023   PONV (postoperative nausea and vomiting)    Pre-diabetes    Raynaud's disease    Raynaud's disease    Stroke (HCC) 01/2021   balance off, some express aphasia, weakness     Social History   Tobacco Use   Smoking status: Never   Smokeless tobacco: Never  Vaping Use   Vaping status: Never Used  Substance Use Topics   Alcohol use: Not Currently    Comment: up to 3 drinks per week   Drug use: No    Comment: CBD oil     Ms.Cheramie reports that she has never smoked. She has never  used smokeless tobacco. She reports that she does not currently use alcohol. She reports that she does not use drugs.  Tobacco Cessation: Former smoker quit    Past surgical hx, Family hx, Social hx all reviewed.  Current Outpatient Medications on File Prior to Visit  Medication Sig   acetaminophen (TYLENOL) 500 MG tablet Take 1,000 mg by mouth every 6 (six) hours as needed (pain).   ALPRAZolam (XANAX) 0.25 MG tablet Take 1 tablet (0.25 mg total) by mouth 2 (two) times daily as needed for anxiety. May take 2 tablets (0.5 mg total) at bedtime.   amLODipine (NORVASC) 5 MG tablet Take 1 tablet (5 mg total) by mouth daily.   benzonatate (TESSALON) 100 MG capsule Take 2 capsules (200 mg total) by mouth 3 (three) times daily as needed for cough.   carvedilol (COREG) 12.5 MG tablet Take 1 tablet (12.5 mg total) by mouth 2 (two) times daily with a meal.   CHELATED MAGNESIUM PO Take 200 mg by mouth daily.   Cholecalciferol (VITAMIN D) 125 MCG (5000 UT) CAPS Take 1 Capful by mouth daily.   docusate sodium (COLACE) 100 MG capsule Take 1 capsule (100 mg total) by mouth 2 (two) times daily.   dorzolamide-timolol (COSOPT) 22.3-6.8 MG/ML ophthalmic solution Place 1 drop into both eyes 2 (two) times daily.   doxycycline (VIBRAMYCIN) 100 MG capsule Take 1 capsule (100 mg total) by mouth 2 (two) times daily for 7 days.   enoxaparin (LOVENOX) 80 MG/0.8ML injection Inject 80 mg into the skin daily.   ezetimibe (ZETIA) 10 MG tablet Take 10 mg by mouth daily.   FeFum-FePoly-FA-B Cmp-C-Biot (FOLIVANE-PLUS) CAPS Take 1 capsule by mouth in the morning.   FLUoxetine (PROZAC) 20 MG capsule Take 1 capsule (20 mg total) by mouth daily.   folic acid (FOLVITE) 1 MG tablet TAKE 1 TABLET BY MOUTH EVERY DAY   hyoscyamine (LEVSIN SL) 0.125 MG SL tablet Place 1-2 tablets (0.125-0.25mg ) under the tongue every 6 (six) hours as needed for cramping.   ipratropium-albuterol (DUONEB) 0.5-2.5 (3) MG/3ML SOLN Take 3 mLs by  nebulization every 6 (six) hours as needed (Asthma).   isosorbide mononitrate (IMDUR) 30 MG 24 hr tablet Take 0.5 tablets (15 mg total) by mouth daily.   latanoprost (XALATAN) 0.005 % ophthalmic solution Place 1 drop into both eyes at bedtime.    levofloxacin (LEVAQUIN)  750 MG tablet Take 1 tablet (750 mg total) by mouth every other day.   methylPREDNISolone (MEDROL) 4 MG TBPK tablet Take as directed per 6 day dose pack instructions   ondansetron (ZOFRAN-ODT) 4 MG disintegrating tablet Take 1 tablet (4 mg total) by mouth every 8 (eight) hours as needed for nausea or vomiting.   osimertinib mesylate (TAGRISSO) 80 MG tablet Take 1 tablet (80 mg total) by mouth daily.   prochlorperazine (COMPAZINE) 10 MG tablet Take 1 tablet (10 mg total) by mouth every 6 (six) hours as needed.   REPATHA SURECLICK 140 MG/ML SOAJ Inject 140 mg into the skin every 14 (fourteen) days.   No current facility-administered medications on file prior to visit.     Allergies  Allergen Reactions   Hydrocodone-Acetaminophen Other (See Comments)   Penicillins Other (See Comments)    SYNCOPE PATIENT HAS HAD A PCN REACTION WITH IMMEDIATE RASH, FACIAL/TONGUE/THROAT SWELLING, SOB, OR LIGHTHEADEDNESS WITH HYPOTENSION:  #  #  YES  #  # Has patient had a PCN reaction causing severe rash involving mucus membranes or skin necrosis: No Has patient had a PCN reaction that required hospitalization: No Has patient had a PCN reaction occurring within the last 10 years: No  Other reaction(s): other   Lactose Other (See Comments)    Allergy to all dairy products Abdominal cramping   Vicodin [Hydrocodone-Acetaminophen] Other (See Comments)    Sped up heart and breathing   Bacid Nausea And Vomiting   Other Other (See Comments)    Glaucoma eye drop - turned eyes dark    Review Of Systems:  Constitutional:   No  weight loss, night sweats,  Fevers, chills,+ fatigue, or  lassitude.  HEENT:   No headaches,  Difficulty swallowing,   Tooth/dental problems, or  Sore throat,                No sneezing, itching, ear ache, +nasal congestion,+ post nasal drip,   CV:  + chest pain,  Orthopnea, PND, swelling in lower extremities, anasarca, dizziness, palpitations, syncope.   GI  No heartburn, indigestion, abdominal pain, nausea, vomiting, diarrhea, change in bowel habits, +loss of appetite, bloody stools.   Resp: + shortness of breath with exertion less at rest.  + excess mucus, + productive cough,  + non-productive cough,  No coughing up of blood.  No change in color of mucus.  + occasional wheezing.  No chest wall deformity  Skin: no rash or lesions.  GU: no dysuria, change in color of urine, no urgency or frequency.  No flank pain, no hematuria   MS:  No joint pain or swelling.  No decreased range of motion.  No back pain.  Psych:  No change in mood or affect. No depression or anxiety.  No memory loss.   Vital Signs BP (!) 173/94 (BP Location: Left Arm, Patient Position: Sitting, Cuff Size: Normal)   Pulse 78   Temp (!) 96.9 F (36.1 C) (Temporal)   Ht 5\' 4"  (1.626 m)   Wt 134 lb 6.4 oz (61 kg)   LMP  (LMP Unknown)   SpO2 97%   BMI 23.07 kg/m    Physical Exam:  General- No distress,  A&Ox3, pleasant ENT: No sinus tenderness, TM clear, pale nasal mucosa, no oral exudate,no post nasal drip, no LAN Cardiac: S1, S2, regular rate and rhythm, no murmur Chest: No wheeze/ rales/ dullness; no accessory muscle use, no nasal flaring, no sternal retractions, + rhonchi,+ scant crackles per right base  Abd.: Soft Non-tender, ND, BS +, Body mass index is 23.07 kg/m.  Ext: No clubbing cyanosis, edema Neuro:  normal strength, MAE x 4, A&O x 3 Skin: No rashes, warm and dry, No obvious  lesions  Psych: normal mood and behavior   Assessment/Plan Slow to resolve bronchitis vs lymphangitic spread of tumor.  Treated with Antibiotics x 2, Pred Taper x 2 Plan We have done a FENO today in the office. This is 18, which is  within normal limits. We will do a Duo Neb today in the office to see if this helps with your shortness of breath.  We will do a CXR. ( Stat) I will call you with results. I have messaged Dr. [name] to have him take a look at your last CT Chest . I will call you and let you know his thoughts. Finish Levaquin Dr. [name] prescribed.  Continue Mucinex 1200 mg ( liquid of tablets) Use Flutter valve or chest vest to help mobilize the mucus so you can cough it up.  Continue using your Hycodan cough syrup as you have been doing.  Voice rest. Warm drinks like hot tea and honey. Follow up visit with Maralyn Sago NP or any other apps in 2 weeks to ensure you are responding to treatment Call if you need [place] sooner  Please contact office for sooner follow up if symptoms do not improve or worsen or seek emergency care    Stage IV Adenocarcinoma of the lung Concern for lymphangitic spread of tumor.  Plan PET scan to better evaluate nodule Per oncology  Addendum I have personally reviewed the patient's CXR done today. Dr. [name] has reviewed both her CT chest and her CXR today.He feels this is  lymphangitic spread of tumor. I will call the patient and let her know. I will also order a PET scan to better evaluate. She will follow up with me after PET.  I spent 60 minutes dedicated to the care of this patient on the date of this encounter to include pre-visit review of records, face-to-face time with the patient discussing conditions above, post visit ordering of testing, clinical documentation with the electronic health record, making appropriate referrals as documented, and communicating necessary information to the patient's healthcare team.    Bevelyn Ngo, NP 08/06/2023  9:03 AM

## 2023-08-06 NOTE — Telephone Encounter (Signed)
I have called the patient with the results of her CXR and to let her know that Dr.Byrum has looked at her Ct Chest done 07/25/2023. Both the CXR and the CT chest are concerning for lymphangitic spread of tumor. I told her I have ordered a CT Chest to better evaluate the nodule. She verbalized understanding and had no further questions at completion of the call. She understands she will get a call to schedule the PET scan.

## 2023-08-07 ENCOUNTER — Other Ambulatory Visit (HOSPITAL_COMMUNITY): Payer: Self-pay

## 2023-08-07 ENCOUNTER — Encounter (HOSPITAL_COMMUNITY): Payer: Self-pay

## 2023-08-09 ENCOUNTER — Encounter: Payer: Self-pay | Admitting: Physician Assistant

## 2023-08-09 ENCOUNTER — Telehealth: Payer: Self-pay | Admitting: Physician Assistant

## 2023-08-09 NOTE — Progress Notes (Deleted)
 [facility] OFFICE PROGRESS NOTE  [name], MD [address] [place] [address]  DIAGNOSIS: Stage IV (T2 a,N2, M1a) non-small cell lung cancer, adenocarcinoma diagnosed in July 2019 and presented with right upper lobe lung mass in addition to mediastinal lymphadenopathy as well as bilateral pulmonary nodules and malignant right pleural effusion.   Biomarker Findings Microsatellite status - Cannot Be Determined Tumor Mutational Burden - Cannot Be Determined Genomic Findings For a complete list of the genes assayed, please refer to the Appendix. EGFR exon 19 deletion ([id number]) TP53 Y220C 7 Disease relevant genes with no reportable alterations: KRAS, ALK, BRAF, MET, RET, ERBB2, ROS1   PRIOR THERAPY:  1) Status post right Pleurx catheter placement by Dr. Donata Clay for drainage of malignant right pleural effusion. 2) palliative radiotherapy to the enlarging right upper lobe lung mass and mediastinum under the care of Dr. Roselind Messier expected to be completed on January 05, 2021. 3) Systemic chemotherapy with carboplatin for AUC of 5 and Alimta 500 Mg/M2 every 3 weeks.  First dose 03/17/2021.  The patient will also continue her current treatment with Tagrisso 80 mg p.o. daily.  She is status post 26 cycles. Starting from cycle #7, she will be on Alimta only 400 mg/m2.  Her treatment with Alimta has been on hold for several months now.  She is currently on treatment with single agent Tagrisso 80 mg p.o. daily  CURRENT THERAPY: Tagrisso 80 mg p.o. daily.  First dose was given on 01/29/2018.  Status post 34*** months of treatment  INTERVAL HISTORY: Allison [name] 73 y.o. female returns to clinic today for follow-up visit accompanied by ***.  Patient was last seen by Dr. [name] on 08/02/2023.  The patient has stage IV lung cancer for which she is currently on targeted treatment with Tagrisso.  She has been on this since August 2019.  After disease progression, she  received carboplatin and Alimta in addition to Tagrisso which was later discontinued due to side effects.  She continued on Tagrisso monotherapy.  At her last appointment she was endorsing a persistent cough that began in January 2025 leading to diagnosis of bronchitis.  She was treated with a Medrol Dosepak and Hycodan cough syrup.  She continued to endorse chest and back pain and cough.  She was also prescribed antibiotics with doxycycline.  She was also reporting associated fatigue.  She had a repeat CT scan that showed some density which could be inflammatory versus lymphangitic spread of tumor and she is currently on a tapering dose of prednisone.  Dr. [name] recommended a PET scan.  His last being seen, she denies any fever, chills, or night sweats.  Appetite?  Fatigue?  Cough?  Shortness of breath? She is being considered for pulmonary rehab.  Denies any chest pain or hemoptysis.  Denies any nausea, vomiting, diarrhea, or constipation.  Denies any headache or visual changes.  She is here today for evaluation and to review her PET scan and for more detailed discussion about     MEDICAL HISTORY: Past Medical History:  Diagnosis Date   Anemia    Anxiety    Arthritis    Asthma    exercise induced   Coronary artery disease    Depression    PMH   Dyspnea    GERD (gastroesophageal reflux disease)    Glaucoma    History of radiation therapy 01/05/2021   IMRT right lung  11/24/2020-01/05/2021  Dr Antony Blackbird   Hypertension  lung ca dx'd 11/2017   right   Malignant pleural effusion    right   Nuclear sclerotic cataract of right eye 02/28/2021   Dr. Sallye Lat, cataract surgery February 2023   PONV (postoperative nausea and vomiting)    Pre-diabetes    Raynaud's disease    Raynaud's disease    Stroke (HCC) 01/2021   balance off, some express aphasia, weakness    ALLERGIES:  is allergic to hydrocodone-acetaminophen, penicillins, lactose, vicodin [hydrocodone-acetaminophen],  bacid, and other.  MEDICATIONS:  Current Outpatient Medications  Medication Sig Dispense Refill   acetaminophen (TYLENOL) 500 MG tablet Take 1,000 mg by mouth every 6 (six) hours as needed (pain).     ALPRAZolam (XANAX) 0.25 MG tablet Take 1 tablet (0.25 mg total) by mouth 2 (two) times daily as needed for anxiety. May take 2 tablets (0.5 mg total) at bedtime. 60 tablet 0   amLODipine (NORVASC) 5 MG tablet Take 1 tablet (5 mg total) by mouth daily. 90 tablet 3   benzonatate (TESSALON) 100 MG capsule Take 2 capsules (200 mg total) by mouth 3 (three) times daily as needed for cough. 60 capsule 2   carvedilol (COREG) 12.5 MG tablet Take 1 tablet (12.5 mg total) by mouth 2 (two) times daily with a meal. 180 tablet 3   CHELATED MAGNESIUM PO Take 200 mg by mouth daily.     Cholecalciferol (VITAMIN D) 125 MCG (5000 UT) CAPS Take 1 Capful by mouth daily.     docusate sodium (COLACE) 100 MG capsule Take 1 capsule (100 mg total) by mouth 2 (two) times daily. 60 capsule 5   dorzolamide-timolol (COSOPT) 22.3-6.8 MG/ML ophthalmic solution Place 1 drop into both eyes 2 (two) times daily.     doxycycline (VIBRAMYCIN) 100 MG capsule Take 1 capsule (100 mg total) by mouth 2 (two) times daily for 7 days. 14 capsule 0   enoxaparin (LOVENOX) 80 MG/0.8ML injection Inject 80 mg into the skin daily.     ezetimibe (ZETIA) 10 MG tablet Take 10 mg by mouth daily.     FeFum-FePoly-FA-B Cmp-C-Biot (FOLIVANE-PLUS) CAPS Take 1 capsule by mouth in the morning. 90 capsule 0   FLUoxetine (PROZAC) 20 MG capsule Take 1 capsule (20 mg total) by mouth daily. 90 capsule 3   folic acid (FOLVITE) 1 MG tablet TAKE 1 TABLET BY MOUTH EVERY DAY 90 tablet 1   Glycopyrrolate-Formoterol (BEVESPI AEROSPHERE) 9-4.8 MCG/ACT AERO Inhale 2 puffs into the lungs daily. 10.7 g 0   hyoscyamine (LEVSIN SL) 0.125 MG SL tablet Place 1-2 tablets (0.125-0.25mg ) under the tongue every 6 (six) hours as needed for cramping. 30 tablet 3    ipratropium-albuterol (DUONEB) 0.5-2.5 (3) MG/3ML SOLN Take 3 mLs by nebulization every 6 (six) hours as needed (Asthma). 360 mL 3   isosorbide mononitrate (IMDUR) 30 MG 24 hr tablet Take 0.5 tablets (15 mg total) by mouth daily. 90 tablet 3   latanoprost (XALATAN) 0.005 % ophthalmic solution Place 1 drop into both eyes at bedtime.      levofloxacin (LEVAQUIN) 750 MG tablet Take 1 tablet (750 mg total) by mouth every other day. 4 tablet 0   methylPREDNISolone (MEDROL) 4 MG TBPK tablet Take as directed per 6 day dose pack instructions 21 tablet 0   ondansetron (ZOFRAN-ODT) 4 MG disintegrating tablet Take 1 tablet (4 mg total) by mouth every 8 (eight) hours as needed for nausea or vomiting. 30 tablet 1   osimertinib mesylate (TAGRISSO) 80 MG tablet Take 1 tablet (80 mg  total) by mouth daily. 30 tablet 4   prochlorperazine (COMPAZINE) 10 MG tablet Take 1 tablet (10 mg total) by mouth every 6 (six) hours as needed. 30 tablet 2   REPATHA SURECLICK 140 MG/ML SOAJ Inject 140 mg into the skin every 14 (fourteen) days. 6 mL 3   No current facility-administered medications for this visit.    SURGICAL HISTORY:  Past Surgical History:  Procedure Laterality Date   ABDOMINAL HYSTERECTOMY     partial   BRONCHIAL BIOPSY  04/21/2021   Procedure: BRONCHIAL BIOPSIES;  Surgeon: Josephine Igo, DO;  Location: [facility];  Service: Pulmonary;;   BRONCHIAL BRUSHINGS  04/21/2021   Procedure: BRONCHIAL BRUSHINGS;  Surgeon: Josephine Igo, DO;  Location: [facility];  Service: Pulmonary;;   BRONCHIAL NEEDLE ASPIRATION BIOPSY  04/21/2021   Procedure: BRONCHIAL NEEDLE ASPIRATION BIOPSIES;  Surgeon: Josephine Igo, DO;  Location: [facility];  Service: Pulmonary;;   CHEST TUBE INSERTION Right 01/01/2018   Procedure: INSERTION PLEURAL DRAINAGE CATHETER;  Surgeon: Kerin Perna, MD;  Location: [facility] OR;  Service: Thoracic;  Laterality: Right;   CHEST TUBE INSERTION  04/21/2021   Procedure: CHEST TUBE INSERTION;   Surgeon: Josephine Igo, DO;  Location: [facility];  Service: Pulmonary;;   COLONOSCOPY     CORONARY STENT INTERVENTION N/A 06/16/2019   Procedure: CORONARY STENT INTERVENTION;  Surgeon: Corky Crafts, MD;  Location: [facility]VAS[facility] CV LAB;  Service: Cardiovascular;  Laterality: N/A;   DILATION AND CURETTAGE OF UTERUS     EYE SURGERY     due to Glaucoma   IR IMAGING GUIDED PORT INSERTION  03/22/2021   IR PORT REPAIR CENTRAL VENOUS ACCESS DEVICE  04/15/2021   LEFT HEART CATH AND CORONARY ANGIOGRAPHY N/A 06/16/2019   Procedure: LEFT HEART CATH AND CORONARY ANGIOGRAPHY;  Surgeon: Corky Crafts, MD;  Location[facility] INVAS[facility] CV LAB;  Service: Cardiovascular;  Laterality: N/A;   REMOVAL OF PLEURAL DRAINAGE CATHETER Right 11/07/2018   Procedure: REMOVAL OF PLEURAL DRAINAGE CATHETER;  Surgeon: Kerin Perna, MD;  Location: [facility] OR;  Service: Thoracic;  Laterality: Right;   ROTATOR CUFF REPAIR     TUBAL LIGATION     VIDEO BRONCHOSCOPY WITH ENDOBRONCHIAL NAVIGATION Bilateral 04/21/2021   Procedure: VIDEO BRONCHOSCOPY WITH ENDOBRONCHIAL NAVIGATION;  Surgeon: Josephine Igo, DO;  Location: [facility];  Service: Pulmonary;  Laterality: Bilateral;  ION   WISDOM TOOTH EXTRACTION      REVIEW OF SYSTEMS:   Review of Systems  Constitutional: Negative for appetite change, chills, fatigue, fever and unexpected weight change.  HENT:   Negative for mouth sores, nosebleeds, sore throat and trouble swallowing.   Eyes: Negative for eye problems and icterus.  Respiratory: Negative for cough, hemoptysis, shortness of breath and wheezing.   Cardiovascular: Negative for chest pain and leg swelling.  Gastrointestinal: Negative for abdominal pain, constipation, diarrhea, nausea and vomiting.  Genitourinary: Negative for bladder incontinence, difficulty urinating, dysuria, frequency and hematuria.   Musculoskeletal: Negative for back pain, gait problem, neck pain and neck stiffness.  Skin: Negative for  itching and rash.  Neurological: Negative for dizziness, extremity weakness, gait problem, headaches, light-headedness and seizures.  Hematological: Negative for adenopathy. Does not bruise/bleed easily.  Psychiatric/Behavioral: Negative for confusion, depression and sleep disturbance. The patient is not nervous/anxious.     PHYSICAL EXAMINATION:  There were no vitals taken for this visit.  ECOG PERFORMANCE STATUS: {CHL ONC ECOG [id number]  Physical Exam  Constitutional: Oriented to person, place, and time and  well-developed, well-nourished, and in no distress. No distress.  HENT:  Head: Normocephalic and atraumatic.  Mouth/Throat: Oropharynx is clear and moist. No oropharyngeal exudate.  Eyes: Conjunctivae are normal. Right eye exhibits no discharge. Left eye exhibits no discharge. No scleral icterus.  Neck: Normal range of motion. Neck supple.  Cardiovascular: Normal rate, regular rhythm, normal heart sounds and intact distal pulses.   Pulmonary/Chest: Effort normal and breath sounds normal. No respiratory distress. No wheezes. No rales.  Abdominal: Soft. Bowel sounds are normal. Exhibits no distension and no mass. There is no tenderness.  Musculoskeletal: Normal range of motion. Exhibits no edema.  Lymphadenopathy:    No cervical adenopathy.  Neurological: Alert and oriented to person, place, and time. Exhibits normal muscle tone. Gait normal. Coordination normal.  Skin: Skin is warm and dry. No rash noted. Not diaphoretic. No erythema. No pallor.  Psychiatric: Mood, memory and judgment normal.  Vitals reviewed.  LABORATORY DATA: Lab Results  Component Value Date   WBC 4.5 07/25/2023   HGB 12.2 07/25/2023   HCT 38.1 07/25/2023   MCV 83.7 07/25/2023   PLT 104 (L) 07/25/2023      Chemistry      Component Value Date/Time   NA 139 07/25/2023 0805   NA 140 03/04/2021 1516   K 3.7 07/25/2023 0805   CL 106 07/25/2023 0805   CO2 27 07/25/2023 0805   BUN 14 07/25/2023  0805   BUN 21 03/04/2021 1516   CREATININE 1.38 (H) 07/25/2023 0805      Component Value Date/Time   CALCIUM 9.1 07/25/2023 0805   ALKPHOS 66 07/25/2023 0805   AST 19 07/25/2023 0805   ALT 16 07/25/2023 0805   BILITOT 0.5 07/25/2023 0805       RADIOGRAPHIC STUDIES:  DG Chest 2 View Result Date: 08/06/2023 CLINICAL DATA:  Shortness of breath with exertion EXAM: CHEST - 2 VIEW COMPARISON:  Chest radiograph 01/17/2022 FINDINGS: Stable cardiac and mediastinal contours. Port-A-Cath tip projects over the superior vena cava. Stable consolidation right upper hemithorax. Similar opacities throughout the right mid and lower lung which may represent lymphangitic spread of tumor. Small right pleural effusion. No pneumothorax. Thoracic spine degenerative changes. Clear left lung. IMPRESSION: 1. Stable consolidation right upper hemithorax. 2. Similar opacities throughout the right mid and lower lung which may represent lymphangitic spread of tumor. 3. Small right pleural effusion. Electronically Signed   By: Annia Belt M.D.   On: 08/06/2023 13:02   CT CHEST ABDOMEN PELVIS W CONTRAST Result Date: 08/01/2023 CLINICAL DATA:  Non-small cell lung cancer.  * Tracking Code: BO * EXAM: CT CHEST, ABDOMEN, AND PELVIS WITH CONTRAST TECHNIQUE: Multidetector CT imaging of the chest, abdomen and pelvis was performed following the standard protocol during bolus administration of intravenous contrast. RADIATION DOSE REDUCTION: This exam was performed according to the departmental dose-optimization program which includes automated exposure control, adjustment of the mA and/or kV according to patient size and/or use of iterative reconstruction technique. CONTRAST:  80mL OMNIPAQUE IOHEXOL 300 MG/ML  SOLN COMPARISON:  04/25/2023. FINDINGS: CT CHEST FINDINGS Cardiovascular: Right IJ Port-A-Cath terminates in the right atrium. Atherosclerotic calcification of the aorta and coronary arteries. Heart is at the upper limits of normal  in size. Left ventricle appears somewhat dilated. No pericardial effusion. Mediastinum/Nodes: No pathologically enlarged mediastinal, hilar or axillary lymph nodes. Soft tissue thickening in the right hilum. Esophagus is grossly unremarkable. Lungs/Pleura: Post treatment consolidation, bronchiectasis and volume loss in the right upper lobe and perihilar right lower lobe. Septal  thickening and nodularity throughout the right hemithorax, new or progressive from 04/25/2023. Thin rind of pleural fluid in the right hemithorax, similar. No suspicious pulmonary nodules in the left lung. Trace left pleural fluid. Airway is unremarkable. Musculoskeletal: Degenerative changes in the spine. No worrisome lytic or sclerotic lesions. CT ABDOMEN PELVIS FINDINGS Hepatobiliary: Liver and gallbladder are unremarkable. No biliary ductal dilatation. Pancreas: Negative. Spleen: Negative. Adrenals/Urinary Tract: Adrenal glands are unremarkable. Mild renal cortical scarring bilaterally. Subcentimeter low-attenuation lesion in the right kidney, too small to characterize. No specific follow-up necessary. Ureters are decompressed. Bladder is low in volume. Stomach/Bowel: Stomach, small bowel and colon are unremarkable. Appendix is not readily visualized. Vascular/Lymphatic: Atherosclerotic calcification of the aorta. No pathologically enlarged lymph nodes. Reproductive: Hysterectomy.  No adnexal mass. Other: Trace pelvic free fluid. Mesenteries and peritoneum are unremarkable. Musculoskeletal: Osteopenia. Degenerative changes in the spine. Grade 1 anterolisthesis of L5 on S1. IMPRESSION: 1. Post radiation consolidation in the upper right hemithorax. New or increasing septal thickening and perilymphatic nodularity throughout the right hemithorax, highly worrisome for lymphangitic carcinomatosis. 2. Thin rind of pleural fluid in the right hemithorax, stable. Trace left pleural effusion. 3. Aortic atherosclerosis (ICD10-I70.0). Coronary artery  calcification. Electronically Signed   By: Leanna Battles M.D.   On: 08/01/2023 16:59     ASSESSMENT/PLAN:  No problem-specific Assessment & Plan notes found for this encounter.   No orders of the defined types were placed in this encounter.    I spent {CHL ONC TIME VISIT - VWUJW:[id number]} counseling the patient face to face. The total time spent in the appointment was {CHL ONC TIME VISIT - AOZHY:[id number]}.  Shenna Brissette L Rilynn Habel, PA-C 08/09/23

## 2023-08-13 ENCOUNTER — Inpatient Hospital Stay: Payer: Medicare HMO | Admitting: Physician Assistant

## 2023-08-13 ENCOUNTER — Ambulatory Visit (HOSPITAL_COMMUNITY)
Admission: RE | Admit: 2023-08-13 | Discharge: 2023-08-13 | Disposition: A | Discharge status: Home / Self Care | Payer: Medicare HMO | Source: Ambulatory Visit | Attending: Acute Care | Admitting: Acute Care

## 2023-08-13 ENCOUNTER — Inpatient Hospital Stay: Payer: Medicare HMO

## 2023-08-13 DIAGNOSIS — C3490 Malignant neoplasm of unspecified part of unspecified bronchus or lung: Secondary | ICD-10-CM | POA: Insufficient documentation

## 2023-08-13 LAB — GLUCOSE, CAPILLARY: Glucose-Capillary: 109 mg/dL — ABNORMAL HIGH (ref 70–99)

## 2023-08-13 MED ORDER — FLUDEOXYGLUCOSE F - 18 (FDG) INJECTION
6.5000 | Freq: Once | INTRAVENOUS | Status: AC | PRN
  Administered 2023-08-13: 6.5 via INTRAVENOUS

## 2023-08-16 ENCOUNTER — Ambulatory Visit (HOSPITAL_COMMUNITY): Payer: Medicare HMO

## 2023-08-20 ENCOUNTER — Other Ambulatory Visit (HOSPITAL_COMMUNITY): Payer: Self-pay

## 2023-08-20 ENCOUNTER — Encounter: Payer: Self-pay | Admitting: Acute Care

## 2023-08-20 ENCOUNTER — Ambulatory Visit: Payer: Medicare HMO | Admitting: Acute Care

## 2023-08-20 ENCOUNTER — Encounter: Payer: Self-pay | Admitting: Internal Medicine

## 2023-08-20 ENCOUNTER — Encounter: Payer: Self-pay | Admitting: Physician Assistant

## 2023-08-20 VITALS — BP 129/88 | HR 71 | Temp 98.3°F | Ht 64.0 in | Wt 131.2 lb

## 2023-08-20 DIAGNOSIS — C3491 Malignant neoplasm of unspecified part of right bronchus or lung: Secondary | ICD-10-CM | POA: Diagnosis not present

## 2023-08-20 DIAGNOSIS — J440 Chronic obstructive pulmonary disease with acute lower respiratory infection: Secondary | ICD-10-CM

## 2023-08-20 DIAGNOSIS — R051 Acute cough: Secondary | ICD-10-CM

## 2023-08-20 DIAGNOSIS — R5383 Other fatigue: Secondary | ICD-10-CM

## 2023-08-20 DIAGNOSIS — J209 Acute bronchitis, unspecified: Secondary | ICD-10-CM

## 2023-08-20 DIAGNOSIS — R630 Anorexia: Secondary | ICD-10-CM | POA: Diagnosis not present

## 2023-08-20 DIAGNOSIS — R0600 Dyspnea, unspecified: Secondary | ICD-10-CM

## 2023-08-20 MED ORDER — BEVESPI AEROSPHERE 9-4.8 MCG/ACT IN AERO
2.0000 | INHALATION_SPRAY | Freq: Two times a day (BID) | RESPIRATORY_TRACT | 3 refills | Status: DC
  Filled 2023-08-20: qty 10.7, 30d supply, fill #0
  Filled 2023-10-24: qty 10.7, 30d supply, fill #1
  Filled 2023-11-20: qty 10.7, 30d supply, fill #2

## 2023-08-20 MED ORDER — HYDROCODONE BIT-HOMATROP MBR 5-1.5 MG/5ML PO SOLN
5.0000 mL | Freq: Four times a day (QID) | ORAL | 0 refills | Status: DC | PRN
  Filled 2023-08-20: qty 240, 12d supply, fill #0

## 2023-08-20 NOTE — Patient Instructions (Addendum)
 It is good to see you today. I am so sorry you are not feeling better.  I will reach out to [facility] to see if she can get you in to see [facility] sooner than Thursday. We have reviewed your PET scan which does show concern for Lymphogenic spread of disease.  I am concerned about your fatigue. I will let Dr. [name] know, and see if he would like some labs drawn sooner.  I will let you know if he wants additional labs and I will place the orders.  Hang in there.  Add Boost as able.  Call if you need [place] sooner  Follow up with Buelah Manis as is scheduled in 1 week to see if the inhaler has helped with your shortness of breath. She will help determine of you need follow up after that visit, and she will schedule.  Please contact office for sooner follow up if symptoms do not improve or worsen or seek emergency care

## 2023-08-20 NOTE — Progress Notes (Signed)
 History of Present Illness Allison [name] is a 73 y.o. female former smoker with stage 4 adenocarcinoma of the right lung, (12/2017), treated with  chemo, and radiation therapy, hypertension, CVA, CAD, DVT/PE (on Lovenox),iron deficiency anemia,  and GERD. She has been  followed by Dr. [name]. She will be followed by  Dr. [name] moving forward.  Synopsis Pt. Presented for a slow to resolve  bronchitis  on 08/06/2023 that started 07/09/2023. She was at the [facility] parade, and she thinks the cold air started the flare. She was seen by her PCP on 07/16/2023. She tested negative for Flu A, Covid, and RSV. She was treated with a  prednisone taper, and  Doxycycline. She stated she did not feel any better after treatment. She did have a CT Chest 07/25/2023 per oncology as  follow up for lung cancer surveillance , which showed inflammatory changes and and ? Of lymphangitic carcinomatosis. She saw Dr. [name], PCP,  again, and she had a repeat prednisone taper. She has seen Dr. [name] since, and he placed her on Levaquin 08/02/2023 which she takes every other day x 4 tablets. She stated she felt no better on the Levaquin, but she  had only 2 doses. She stated her appetite was poor , and her secretions are clear, but thicker. She was doing Muicinex with her Hycodan cough syrup. She was using this every 4 hours. She endorsed  shortness of breath. She was using her nebulizer twice daily. She had  not been using her flutter valve or chest vest. She denied fever, but she had a very dry cough. She stated her chest is tight at the time , but it does not hurt. Her BP was  elevated ,her sats are 97% . She states she did have wheezing on expiration when she is laying down only. She had completed  her pred taper 08/04/2023 Dr. [name] was  unsure if the changes seen on CT were inflammatory vs recurrence of cancer. I had Dr. [name] look at the scan , he felt it was recurrent lung cancer. .  CXR 08/06/2023  also questioned lymphangitic  spread of tumor.  FENO was 18 ppb on 08/06/2023. Eosinophils on 07/25/2023 were 100, last prednisone taper was completed 2/15.   08/20/2023 Pt. Presents for follow up after last visit . She states she does not feel much better since she was treated with antibiotics. She states she has continued fatigue and shortness of breath.  She was unable to pick up the inhaler I ordered for her on August 06, 2023.  I have resent this.  I have also encouraged her to utilize her albuterol neb treatments at home.  She states that coughing is what wears her out the most and that her nebulizers do make her cough.  Oxygen saturations today were at 96%. We reviewed the patient's PET scan.  I discussed the fact that based on the radiology read there was posttreatment consolidation and retraction with hypermetabolism in the central right lower lobe which is worried some for disease recurrence.  Additionally there is nodularity and thickening in the right hemithorax which is most indicative of lymphangitic carcinomatosis.  There was also notation of a likely malignant rind of a pleural fluid/thickening in the right hemothorax.  There was also trace of left pleural fluid noticed. I explained to the patient her symptoms may be related to a recurrent disease process.  She is of course devastated.  She has follow-up with Dr. [name] on 08/23/2023.  I  have secure chatted [name], Dr. [name] nurse navigator to see if the patient can be seen any sooner as I am concerned about her fatigue and her lack of appetite.  There are no sooner openings.  I also messaged to see if they wanted me to go ahead and get some lab work so that they would have it available when they see her on March 6 however I have not received a response to that question.  I have provided the patient with the new prescription for Bevespi.  I have renewed her cough syrup prescription.  I have provided her with paperwork for financial assistance with her  Bevespi.  I have asked [name] to call me if she needs me sooner.  She currently is denying fever, hemoptysis, or discolored secretions.  She is here today with her daughter who is helping to take care of her.  Patient did tell me that she had some blood in her stool about a week ago that cleared after 24 hours.  She felt like this was secondary to some diarrhea that she had and was  possibly an irritated hemorrhoid.  She is scheduled to have labs through oncology when she sees them on the March 6.   Test Results: 08/13/2023 PET Scan  Post treatment parenchymal consolidation and retraction with hypermetabolism in the central right lower lobe, worrisome for disease recurrence. Perilymphatic nodularity and thickening in the right hemithorax, most indicative of lymphangitic carcinomatosis. Associated hypermetabolic mediastinal lymph nodes. 2. Likely malignant rind of pleural fluid/thickening in the right hemithorax. 3. Trace left pleural fluid. 4.  Aortic atherosclerosis (ICD10-I70.0).      Latest Ref Rng & Units 07/25/2023    8:05 AM 04/25/2023    7:40 AM 01/22/2023    7:38 AM  CBC  WBC 4.0 - 10.5 K/uL 4.5  3.6  2.8   Hemoglobin 12.0 - 15.0 g/dL 16.1  09.6  04.5   Hematocrit 36.0 - 46.0 % 38.1  36.9  35.1   Platelets 150 - 400 K/uL 104  145  149        Latest Ref Rng & Units 07/25/2023    8:05 AM 04/25/2023    7:40 AM 01/22/2023    7:38 AM  BMP  Glucose 70 - 99 mg/dL 96  91  91   BUN 8 - 23 mg/dL 14  18  16    Creatinine 0.44 - 1.00 mg/dL 4.09  8.11  9.14   Sodium 135 - 145 mmol/L 139  138  141   Potassium 3.5 - 5.1 mmol/L 3.7  3.8  4.0   Chloride 98 - 111 mmol/L 106  105  108   CO2 22 - 32 mmol/L 27  27  27    Calcium 8.9 - 10.3 mg/dL 9.1  9.4  9.2     BNP    Component Value Date/Time   BNP 35.4 02/07/2021 1640    ProBNP No results found for: "PROBNP"  PFT No results found for: "FEV1PRE", "FEV1POST", "FVCPRE", "FVCPOST", "TLC", "DLCOUNC", "PREFEV1FVCRT",  "PSTFEV1FVCRT"  NM PET Image Initial (PI) Skull Base To Thigh Result Date: 08/16/2023 CLINICAL DATA:  Subsequent treatment strategy for small cell lung cancer. EXAM: NUCLEAR MEDICINE PET SKULL BASE TO THIGH TECHNIQUE: 6.5 mCi F-18 FDG was injected intravenously. Full-ring PET imaging was performed from the skull base to thigh after the radiotracer. CT data was obtained and used for attenuation correction and anatomic localization. Fasting blood glucose: 109 mg/dl COMPARISON:  78/29/5621. FINDINGS: Mediastinal blood pool  activity: SUV max 3.0 Liver activity: SUV max NA NECK: No abnormal hypermetabolism. Incidental CT findings: None. CHEST: Mildly hypermetabolic mediastinal lymph nodes. Index subcarinal lymph node measures approximately 1.3 cm, SUV max 5.6, compared to 8.6 previously. Mildly hypermetabolic parenchymal consolidation and retraction in the apical and medial upper right hemithorax. SUV max 6.9 in the central right lower lobe (4/73). No additional abnormal hypermetabolism. Incidental CT findings: Right IJ Port-A-Cath terminates at the SVC RA junction or high right atrium. Atherosclerotic calcification of the aorta. Heart is at the upper limits of normal in size. Perilymphatic nodularity and septal thickening throughout the right upper, right middle and right lower lobes with a thin rind of mildly hypermetabolic pleural fluid/thickening in the right hemithorax. Trace left pleural effusion. ABDOMEN/PELVIS: No abnormal hypermetabolism. Incidental CT findings: None. SKELETON: No abnormal hypermetabolism. Incidental CT findings: Degenerative changes in the spine. IMPRESSION: 1. Post treatment parenchymal consolidation and retraction with hypermetabolism in the central right lower lobe, worrisome for disease recurrence. Perilymphatic nodularity and thickening in the right hemithorax, most indicative of lymphangitic carcinomatosis. Associated hypermetabolic mediastinal lymph nodes. 2. Likely malignant rind of  pleural fluid/thickening in the right hemithorax. 3. Trace left pleural fluid. 4.  Aortic atherosclerosis (ICD10-I70.0). Electronically Signed   By: Leanna Battles M.D.   On: 08/16/2023 14:22   DG Chest 2 View Result Date: 08/06/2023 CLINICAL DATA:  Shortness of breath with exertion EXAM: CHEST - 2 VIEW COMPARISON:  Chest radiograph 01/17/2022 FINDINGS: Stable cardiac and mediastinal contours. Port-A-Cath tip projects over the superior vena cava. Stable consolidation right upper hemithorax. Similar opacities throughout the right mid and lower lung which may represent lymphangitic spread of tumor. Small right pleural effusion. No pneumothorax. Thoracic spine degenerative changes. Clear left lung. IMPRESSION: 1. Stable consolidation right upper hemithorax. 2. Similar opacities throughout the right mid and lower lung which may represent lymphangitic spread of tumor. 3. Small right pleural effusion. Electronically Signed   By: Annia Belt M.D.   On: 08/06/2023 13:02   CT CHEST ABDOMEN PELVIS W CONTRAST Result Date: 08/01/2023 CLINICAL DATA:  Non-small cell lung cancer.  * Tracking Code: BO * EXAM: CT CHEST, ABDOMEN, AND PELVIS WITH CONTRAST TECHNIQUE: Multidetector CT imaging of the chest, abdomen and pelvis was performed following the standard protocol during bolus administration of intravenous contrast. RADIATION DOSE REDUCTION: This exam was performed according to the departmental dose-optimization program which includes automated exposure control, adjustment of the mA and/or kV according to patient size and/or use of iterative reconstruction technique. CONTRAST:  80mL OMNIPAQUE IOHEXOL 300 MG/ML  SOLN COMPARISON:  04/25/2023. FINDINGS: CT CHEST FINDINGS Cardiovascular: Right IJ Port-A-Cath terminates in the right atrium. Atherosclerotic calcification of the aorta and coronary arteries. Heart is at the upper limits of normal in size. Left ventricle appears somewhat dilated. No pericardial effusion.  Mediastinum/Nodes: No pathologically enlarged mediastinal, hilar or axillary lymph nodes. Soft tissue thickening in the right hilum. Esophagus is grossly unremarkable. Lungs/Pleura: Post treatment consolidation, bronchiectasis and volume loss in the right upper lobe and perihilar right lower lobe. Septal thickening and nodularity throughout the right hemithorax, new or progressive from 04/25/2023. Thin rind of pleural fluid in the right hemithorax, similar. No suspicious pulmonary nodules in the left lung. Trace left pleural fluid. Airway is unremarkable. Musculoskeletal: Degenerative changes in the spine. No worrisome lytic or sclerotic lesions. CT ABDOMEN PELVIS FINDINGS Hepatobiliary: Liver and gallbladder are unremarkable. No biliary ductal dilatation. Pancreas: Negative. Spleen: Negative. Adrenals/Urinary Tract: Adrenal glands are unremarkable. Mild renal cortical scarring bilaterally. Subcentimeter  low-attenuation lesion in the right kidney, too small to characterize. No specific follow-up necessary. Ureters are decompressed. Bladder is low in volume. Stomach/Bowel: Stomach, small bowel and colon are unremarkable. Appendix is not readily visualized. Vascular/Lymphatic: Atherosclerotic calcification of the aorta. No pathologically enlarged lymph nodes. Reproductive: Hysterectomy.  No adnexal mass. Other: Trace pelvic free fluid. Mesenteries and peritoneum are unremarkable. Musculoskeletal: Osteopenia. Degenerative changes in the spine. Grade 1 anterolisthesis of L5 on S1. IMPRESSION: 1. Post radiation consolidation in the upper right hemithorax. New or increasing septal thickening and perilymphatic nodularity throughout the right hemithorax, highly worrisome for lymphangitic carcinomatosis. 2. Thin rind of pleural fluid in the right hemithorax, stable. Trace left pleural effusion. 3. Aortic atherosclerosis (ICD10-I70.0). Coronary artery calcification. Electronically Signed   By: Leanna Battles M.D.   On:  08/01/2023 16:59     Past medical hx Past Medical History:  Diagnosis Date   Anemia    Anxiety    Arthritis    Asthma    exercise induced   Coronary artery disease    Depression    PMH   Dyspnea    GERD (gastroesophageal reflux disease)    Glaucoma    History of radiation therapy 01/05/2021   IMRT right lung  11/24/2020-01/05/2021  Dr Antony Blackbird   Hypertension    lung ca dx'd 11/2017   right   Malignant pleural effusion    right   Nuclear sclerotic cataract of right eye 02/28/2021   Dr. Sallye Lat, cataract surgery February 2023   PONV (postoperative nausea and vomiting)    Pre-diabetes    Raynaud's disease    Raynaud's disease    Stroke (HCC) 01/2021   balance off, some express aphasia, weakness     Social History   Tobacco Use   Smoking status: Never   Smokeless tobacco: Never  Vaping Use   Vaping status: Never Used  Substance Use Topics   Alcohol use: Not Currently    Comment: up to 3 drinks per week   Drug use: No    Comment: CBD oil     Ms.Knoll reports that she has never smoked. She has never used smokeless tobacco. She reports that she does not currently use alcohol. She reports that she does not use drugs.  Tobacco Cessation: Never smoker    Past surgical hx, Family hx, Social hx all reviewed.  Current Outpatient Medications on File Prior to Visit  Medication Sig   acetaminophen (TYLENOL) 500 MG tablet Take 1,000 mg by mouth every 6 (six) hours as needed (pain).   ALPRAZolam (XANAX) 0.25 MG tablet Take 1 tablet (0.25 mg total) by mouth 2 (two) times daily as needed for anxiety. May take 2 tablets (0.5 mg total) at bedtime.   amLODipine (NORVASC) 5 MG tablet Take 1 tablet (5 mg total) by mouth daily.   benzonatate (TESSALON) 100 MG capsule Take 2 capsules (200 mg total) by mouth 3 (three) times daily as needed for cough.   carvedilol (COREG) 12.5 MG tablet Take 1 tablet (12.5 mg total) by mouth 2 (two) times daily with a meal.   CHELATED  MAGNESIUM PO Take 200 mg by mouth daily.   Cholecalciferol (VITAMIN D) 125 MCG (5000 UT) CAPS Take 1 Capful by mouth daily.   docusate sodium (COLACE) 100 MG capsule Take 1 capsule (100 mg total) by mouth 2 (two) times daily.   dorzolamide-timolol (COSOPT) 22.3-6.8 MG/ML ophthalmic solution Place 1 drop into both eyes 2 (two) times daily.   doxycycline (VIBRAMYCIN) 100  MG capsule Take 1 capsule (100 mg total) by mouth 2 (two) times daily for 7 days.   enoxaparin (LOVENOX) 80 MG/0.8ML injection Inject 80 mg into the skin daily.   ezetimibe (ZETIA) 10 MG tablet Take 10 mg by mouth daily.   FeFum-FePoly-FA-B Cmp-C-Biot (FOLIVANE-PLUS) CAPS Take 1 capsule by mouth in the morning.   FLUoxetine (PROZAC) 20 MG capsule Take 1 capsule (20 mg total) by mouth daily.   folic acid (FOLVITE) 1 MG tablet TAKE 1 TABLET BY MOUTH EVERY DAY   hyoscyamine (LEVSIN SL) 0.125 MG SL tablet Place 1-2 tablets (0.125-0.25mg ) under the tongue every 6 (six) hours as needed for cramping.   ipratropium-albuterol (DUONEB) 0.5-2.5 (3) MG/3ML SOLN Take 3 mLs by nebulization every 6 (six) hours as needed (Asthma).   isosorbide mononitrate (IMDUR) 30 MG 24 hr tablet Take 0.5 tablets (15 mg total) by mouth daily.   latanoprost (XALATAN) 0.005 % ophthalmic solution Place 1 drop into both eyes at bedtime.    levofloxacin (LEVAQUIN) 750 MG tablet Take 1 tablet (750 mg total) by mouth every other day.   methylPREDNISolone (MEDROL) 4 MG TBPK tablet Take as directed per 6 day dose pack instructions   ondansetron (ZOFRAN-ODT) 4 MG disintegrating tablet Take 1 tablet (4 mg total) by mouth every 8 (eight) hours as needed for nausea or vomiting.   osimertinib mesylate (TAGRISSO) 80 MG tablet Take 1 tablet (80 mg total) by mouth daily.   prochlorperazine (COMPAZINE) 10 MG tablet Take 1 tablet (10 mg total) by mouth every 6 (six) hours as needed.   REPATHA SURECLICK 140 MG/ML SOAJ Inject 140 mg into the skin every 14 (fourteen) days.   No  current facility-administered medications on file prior to visit.     Allergies  Allergen Reactions   Hydrocodone-Acetaminophen Other (See Comments)   Penicillins Other (See Comments)    SYNCOPE PATIENT HAS HAD A PCN REACTION WITH IMMEDIATE RASH, FACIAL/TONGUE/THROAT SWELLING, SOB, OR LIGHTHEADEDNESS WITH HYPOTENSION:  #  #  YES  #  # Has patient had a PCN reaction causing severe rash involving mucus membranes or skin necrosis: No Has patient had a PCN reaction that required hospitalization: No Has patient had a PCN reaction occurring within the last 10 years: No  Other reaction(s): other   Lactose Other (See Comments)    Allergy to all dairy products Abdominal cramping   Vicodin [Hydrocodone-Acetaminophen] Other (See Comments)    Sped up heart and breathing   Bacid Nausea And Vomiting   Other Other (See Comments)    Glaucoma eye drop - turned eyes dark    Review Of Systems:  Constitutional:   No  weight loss, night sweats,  Fevers, chills, +++ fatigue, or ++++ lassitude.  HEENT:   No headaches,  Difficulty swallowing,  Tooth/dental problems, or  Sore throat,                No sneezing, itching, ear ache, nasal congestion, post nasal drip,   CV:  No chest pain, some soreness from coughing, No  Orthopnea, PND, swelling in lower extremities, anasarca, dizziness, palpitations, syncope.   GI  No heartburn, indigestion, abdominal pain, ++nausea, No vomiting, + diarrhea, change in bowel habits, + loss of appetite, + bloody stools that self resolved.   Resp: + shortness of breath with exertion less at rest.  + excess mucus, + productive cough,  No non-productive cough,  No coughing up of blood.  No change in color of mucus.  No wheezing.  No  chest wall deformity  Skin: no rash or lesions.  GU: no dysuria, change in color of urine, no urgency or frequency.  No flank pain, no hematuria   MS:  No joint pain or swelling.  No decreased range of motion.  No back pain.  Psych:  No change  in mood or affect. No depression or anxiety.  No memory loss.   Vital Signs BP 129/88 (BP Location: Left Arm, Patient Position: Sitting, Cuff Size: Normal)   Pulse 71   Temp 98.3 F (36.8 C) (Oral)   Ht 5\' 4"  (1.626 m)   Wt 131 lb 3.2 oz (59.5 kg)   LMP  (LMP Unknown)   SpO2 96%   BMI 22.52 kg/m    Physical Exam:  General- No distress,  A&Ox3, pleasant , frail female ENT: No sinus tenderness, TM clear, pale nasal mucosa, no oral exudate,no post nasal drip, no LAN Cardiac: S1, S2, regular rate and rhythm, no murmur Chest: No wheeze/ rales/ dullness; no accessory muscle use, no nasal flaring, no sternal retractions, slightly diminished per bases Abd.: Soft Non-tender, ND, BS +, Body mass index is 22.52 kg/m.  Ext: No clubbing cyanosis, edema Neuro:  normal strength, MAE x 4, A&O x 3, appropriate Skin: No rashes, warm and dry, no obvious lesions  Psych: normal mood and behavior   Assessment/Plan Slow to resolve bronchitis vs lymphangitic spread of tumor.  Treated with Antibiotics x 2, Pred Taper x 2 No significant improvement  Plan Continue Mucinex 1200 mg ( liquid of tablets) Use Flutter valve or chest vest to help mobilize the mucus so you can cough it up.  Continue using your Hycodan cough syrup as you have been doing.  I have refilled this Voice rest. Warm drinks like hot tea and honey. Follow up visit with Buelah Manis NP as is scheduled to see if you are doing better on Bevespi Call if you need [place] sooner  Please contact office for sooner follow up if symptoms do not improve or worsen or seek emergency care     Stage IV Adenocarcinoma of the lung Concern for lymphangitic spread of tumor.  PET scan concerning for Lymphogenic spread of cancer Plan Pt has follow up with Dr. [name] oncology 08/23/2023  to reassess treatment/ plan of care.  Fatigue Plan Follow up with oncology for labwork  Poor Appetite Plan Add Boost as a supplement Consider Megace to stimulate  appetite if no improvement     I spent 45 minutes dedicated to the care of this patient on the date of this encounter to include pre-visit review of records, face-to-face time with the patient discussing conditions above, post visit ordering of testing, clinical documentation with the electronic health record, making appropriate referrals as documented, and communicating necessary information to the patient's healthcare team.    Bevelyn Ngo, NP 08/20/2023  1:27 PM

## 2023-08-21 NOTE — Progress Notes (Addendum)
 [facility] OFFICE PROGRESS NOTE  [name], MD [address] [place] [address]  DIAGNOSIS: Stage IV (T2 a,N2, M1a) non-small cell lung cancer, adenocarcinoma diagnosed in July 2019 and presented with right upper lobe lung mass in addition to mediastinal lymphadenopathy as well as bilateral pulmonary nodules and malignant right pleural effusion.   Biomarker Findings Microsatellite status - Cannot Be Determined Tumor Mutational Burden - Cannot Be Determined Genomic Findings For a complete list of the genes assayed, please refer to the Appendix. EGFR exon 19 deletion ([id number]) TP53 Y220C 7 Disease relevant genes with no reportable alterations: KRAS, ALK, BRAF, MET, RET, ERBB2, ROS1   PRIOR THERAPY:  1) Status post right Pleurx catheter placement by Dr. Donata Clay for drainage of malignant right pleural effusion. 2) palliative radiotherapy to the enlarging right upper lobe lung mass and mediastinum under the care of Dr. Roselind Messier expected to be completed on January 05, 2021. 3) Systemic chemotherapy with carboplatin for AUC of 5 and Alimta 500 Mg/M2 every 3 weeks.  First dose 03/17/2021.  The patient will also continue her current treatment with Tagrisso 80 mg p.o. daily.  She is status post 26 cycles. Starting from cycle #7, she will be on Alimta only 400 mg/m2.  Her treatment with Alimta has been on hold for several months now.  She is currently on treatment with single agent Tagrisso 80 mg p.o. daily  CURRENT THERAPY: Tagrisso 80 mg p.o. daily.  First dose was given on 01/29/2018.  Status post 63 months of treatment. Sending repeat molecular studies today on 08/23/23 and tentatively arranging IV chemotherapy with alimta 500 mg/m2 IV every 3 weeks to start around 09/06/23   INTERVAL HISTORY: Allison [name] 73 y.o. female returns to the clinic today for follow-up visit accompanied by her daughter and boyfriend.  The patient was last seen by Dr. [name] on  08/02/2023.  The patient has stage IV lung cancer for which she is currently on targeted treatment with Tagrisso.  She has been on this since August 2019.  After disease progression, she received carboplatin and Alimta in addition to Tagrisso which was later discontinued due to side effects about 1 year ago.  She continued on Tagrisso monotherapy.  At her last appointment she was endorsing a persistent cough that began in January 2025 leading to diagnosis of bronchitis.  She was treated with a Medrol Dosepak and Hycodan cough syrup.  She continued to endorse chest and back pain and cough.  She was also prescribed antibiotics with doxycycline.  She was also reporting associated fatigue, weight loss, and progressive generalized weakness. She reports taste alterations.   She had a repeat CT scan that showed some density which could be inflammatory versus lymphangitic spread of tumor. Dr. [name] recommended a PET scan.  The patient saw pulmonary medicine on 08/20/23. They started her on Bevespi. They recommended continuing with mucinex, flutter valve, hycodan cough medication, warm liquids. She was already treated with steroids and antibiotics x2 for her bronchitis vs lymphangitic spread of tumor.   She has been experiencing a persistent cough and progressive weakness since January. The cough persists despite the use of high-coding cough syrup, which was recently renewed, and a previous course of antibiotics and prednisone. She experiences chest discomfort from coughing and uses Tylenol for relief, avoiding ibuprofen. Tessalon is used for cough but is ineffective.  She experiences increased shortness of breath, becoming winded with minimal exertion, such as walking from her bedroom to the kitchen.  She previously participated in pulmonary rehab last year, which she found beneficial, but has not continued this year. No fever, but night sweats occur about twice a week, which have decreased in  frequency.  Significant fatigue is noted, and she has experienced weight loss due to a lack of appetite and taste. She describes chewing as 'weird' and feels extremely tired. Mild oral thrush is noted, which may be affecting taste and appetite.  She reports headaches but no vision changes. A brain scan conducted in January was normal.  Her current medications include Tagrisso, hydrocodone syrup for cough, and a new Bevespi inhaler for shortness of breath. She also takes folic acid regularly.   She is here today for evaluation and to review her PET scan and for more detailed discussion about her treatment options.    MEDICAL HISTORY: Past Medical History:  Diagnosis Date   Anemia    Anxiety    Arthritis    Asthma    exercise induced   Coronary artery disease    Depression    PMH   Dyspnea    GERD (gastroesophageal reflux disease)    Glaucoma    History of radiation therapy 01/05/2021   IMRT right lung  11/24/2020-01/05/2021  Dr Antony Blackbird   Hypertension    lung ca dx'd 11/2017   right   Malignant pleural effusion    right   Nuclear sclerotic cataract of right eye 02/28/2021   Dr. Sallye Lat, cataract surgery February 2023   PONV (postoperative nausea and vomiting)    Pre-diabetes    Raynaud's disease    Raynaud's disease    Stroke (HCC) 01/2021   balance off, some express aphasia, weakness    ALLERGIES:  is allergic to hydrocodone-acetaminophen, penicillins, lactose, vicodin [hydrocodone-acetaminophen], bacid, and other.  MEDICATIONS:  Current Outpatient Medications  Medication Sig Dispense Refill   dexamethasone (DECADRON) 4 MG tablet Take 1 tablet twice a day the day before, day of, and day after treatment 40 tablet 2   methylPREDNISolone (MEDROL DOSEPAK) 4 MG TBPK tablet Take as directed. 21 tablet 0   nystatin (MYCOSTATIN) 100000 UNIT/ML suspension Take 5 mLs (500,000 Units total) by mouth 4 (four) times daily. 60 mL 0   prochlorperazine (COMPAZINE) 10 MG  tablet Take 1 tablet (10 mg total) by mouth every 6 (six) hours as needed. 30 tablet 2   acetaminophen (TYLENOL) 500 MG tablet Take 1,000 mg by mouth every 6 (six) hours as needed (pain).     ALPRAZolam (XANAX) 0.25 MG tablet Take 1 tablet (0.25 mg total) by mouth 2 (two) times daily as needed for anxiety. May take 2 tablets (0.5 mg total) at bedtime. 60 tablet 0   amLODipine (NORVASC) 5 MG tablet Take 1 tablet (5 mg total) by mouth daily. 90 tablet 3   benzonatate (TESSALON) 100 MG capsule Take 2 capsules (200 mg total) by mouth 3 (three) times daily as needed for cough. 60 capsule 2   carvedilol (COREG) 12.5 MG tablet Take 1 tablet (12.5 mg total) by mouth 2 (two) times daily with a meal. 180 tablet 3   CHELATED MAGNESIUM PO Take 200 mg by mouth daily.     Cholecalciferol (VITAMIN D) 125 MCG (5000 UT) CAPS Take 1 Capful by mouth daily.     docusate sodium (COLACE) 100 MG capsule Take 1 capsule (100 mg total) by mouth 2 (two) times daily. 60 capsule 5   dorzolamide-timolol (COSOPT) 22.3-6.8 MG/ML ophthalmic solution Place 1 drop into both eyes 2 (two)  times daily.     doxycycline (VIBRAMYCIN) 100 MG capsule Take 1 capsule (100 mg total) by mouth 2 (two) times daily for 7 days. 14 capsule 0   enoxaparin (LOVENOX) 80 MG/0.8ML injection Inject 80 mg into the skin daily.     ezetimibe (ZETIA) 10 MG tablet Take 10 mg by mouth daily.     FeFum-FePoly-FA-B Cmp-C-Biot (FOLIVANE-PLUS) CAPS Take 1 capsule by mouth in the morning. 90 capsule 0   FLUoxetine (PROZAC) 20 MG capsule Take 1 capsule (20 mg total) by mouth daily. 90 capsule 3   folic acid (FOLVITE) 1 MG tablet TAKE 1 TABLET BY MOUTH EVERY DAY 90 tablet 1   Glycopyrrolate-Formoterol (BEVESPI AEROSPHERE) 9-4.8 MCG/ACT AERO Inhale 2 puffs into the lungs 2 (two) times daily. 10.7 g 3   HYDROcodone bit-homatropine (HYCODAN) 5-1.5 MG/5ML syrup Take 5 mLs by mouth every 6 (six) hours as needed for cough. 240 mL 0   hyoscyamine (LEVSIN SL) 0.125 MG SL  tablet Place 1-2 tablets (0.125-0.25mg ) under the tongue every 6 (six) hours as needed for cramping. 30 tablet 3   ipratropium-albuterol (DUONEB) 0.5-2.5 (3) MG/3ML SOLN Take 3 mLs by nebulization every 6 (six) hours as needed (Asthma). 360 mL 3   isosorbide mononitrate (IMDUR) 30 MG 24 hr tablet Take 0.5 tablets (15 mg total) by mouth daily. 90 tablet 3   latanoprost (XALATAN) 0.005 % ophthalmic solution Place 1 drop into both eyes at bedtime.      levofloxacin (LEVAQUIN) 750 MG tablet Take 1 tablet (750 mg total) by mouth every other day. 4 tablet 0   ondansetron (ZOFRAN-ODT) 4 MG disintegrating tablet Take 1 tablet (4 mg total) by mouth every 8 (eight) hours as needed for nausea or vomiting. 30 tablet 1   osimertinib mesylate (TAGRISSO) 80 MG tablet Take 1 tablet (80 mg total) by mouth daily. 30 tablet 4   prochlorperazine (COMPAZINE) 10 MG tablet Take 1 tablet (10 mg total) by mouth every 6 (six) hours as needed. 30 tablet 2   REPATHA SURECLICK 140 MG/ML SOAJ Inject 140 mg into the skin every 14 (fourteen) days. 6 mL 3   No current facility-administered medications for this visit.    SURGICAL HISTORY:  Past Surgical History:  Procedure Laterality Date   ABDOMINAL HYSTERECTOMY     partial   BRONCHIAL BIOPSY  04/21/2021   Procedure: BRONCHIAL BIOPSIES;  Surgeon: Josephine Igo, DO;  Location: [facility];  Service: Pulmonary;;   BRONCHIAL BRUSHINGS  04/21/2021   Procedure: BRONCHIAL BRUSHINGS;  Surgeon: Josephine Igo, DO;  Location: [facility];  Service: Pulmonary;;   BRONCHIAL NEEDLE ASPIRATION BIOPSY  04/21/2021   Procedure: BRONCHIAL NEEDLE ASPIRATION BIOPSIES;  Surgeon: Josephine Igo, DO;  Location: [facility];  Service: Pulmonary;;   CHEST TUBE INSERTION Right 01/01/2018   Procedure: INSERTION PLEURAL DRAINAGE CATHETER;  Surgeon: Kerin Perna, MD;  Location: [facility] OR;  Service: Thoracic;  Laterality: Right;   CHEST TUBE INSERTION  04/21/2021   Procedure: CHEST TUBE  INSERTION;  Surgeon: Josephine Igo, DO;  Location: [facility];  Service: Pulmonary;;   COLONOSCOPY     CORONARY STENT INTERVENTION N/A 06/16/2019   Procedure: CORONARY STENT INTERVENTION;  Surgeon: Corky Crafts, MD;  Location: [facility];  Service: Cardiovascular;  Laterality: N/A;   DILATION AND CURETTAGE OF UTERUS     EYE SURGERY     due to Glaucoma   IR IMAGING GUIDED PORT INSERTION  03/22/2021   IR PORT REPAIR CENTRAL  VENOUS ACCESS DEVICE  04/15/2021   LEFT HEART CATH AND CORONARY ANGIOGRAPHY N/A 06/16/2019   Procedure: LEFT HEART CATH AND CORONARY ANGIOGRAPHY;  Surgeon: Corky Crafts, MD;  Location: [facility] INVASIVE CV LAB;  Service: Cardiovascular;  Laterality: N/A;   REMOVAL OF PLEURAL DRAINAGE CATHETER Right 11/07/2018   Procedure: REMOVAL OF PLEURAL DRAINAGE CATHETER;  Surgeon: Kerin Perna, MD;  Location: [facility] OR;  Service: Thoracic;  Laterality: Right;   ROTATOR CUFF REPAIR     TUBAL LIGATION     VIDEO BRONCHOSCOPY WITH ENDOBRONCHIAL NAVIGATION Bilateral 04/21/2021   Procedure: VIDEO BRONCHOSCOPY WITH ENDOBRONCHIAL NAVIGATION;  Surgeon: Josephine Igo, DO;  Location: [facility];  Service: Pulmonary;  Laterality: Bilateral;  ION   WISDOM TOOTH EXTRACTION      REVIEW OF SYSTEMS:   Review of Systems  Constitutional: Positive for fatigue, weight loss, and poor appetite. Negative for chills and fever.  HENT: Positive for taste alterations and mild oral thrush. Negative for mouth sores, nosebleeds, sore throat and trouble swallowing.   Eyes: Negative for eye problems and icterus.  Respiratory: Positive for cough and shortness of breath. Negative for hemoptysis and wheezing.   Cardiovascular: Negative for chest pain and leg swelling.  Gastrointestinal: Negative for abdominal pain, constipation, diarrhea, nausea and vomiting.  Genitourinary: Negative for bladder incontinence, difficulty urinating, dysuria, frequency and hematuria.   Musculoskeletal: Negative  for back pain, gait problem, neck pain and neck stiffness.  Skin: Negative for itching and rash.  Neurological: Negative for dizziness, extremity weakness, gait problem, headaches, light-headedness and seizures.  Hematological: Negative for adenopathy. Does not bruise/bleed easily.  Psychiatric/Behavioral: Negative for confusion, depression and sleep disturbance. The patient is not nervous/anxious.     PHYSICAL EXAMINATION:  Blood pressure (!) 173/93, pulse 98, temperature 98.2 F (36.8 C), temperature source Temporal, resp. rate 18, weight 129 lb 14.4 oz (58.9 kg), SpO2 99%.  ECOG PERFORMANCE STATUS: 2  Physical Exam  Constitutional: Oriented to person, place, and time and thin appearing female and in no distress.   HENT:  Head: Normocephalic and atraumatic.  Mouth/Throat: Oropharynx is clear and moist. No oropharyngeal exudate.  Eyes: Conjunctivae are normal. Right eye exhibits no discharge. Left eye exhibits no discharge. No scleral icterus.  Neck: Normal range of motion. Neck supple.  Cardiovascular: Normal rate, regular rhythm, normal heart sounds and intact distal pulses.   Pulmonary/Chest: Effort normal and breath sounds normal. No respiratory distress. No wheezes. No rales.  Abdominal: Soft. Bowel sounds are normal. Exhibits no distension and no mass. There is no tenderness.  Musculoskeletal: Normal range of motion. Exhibits no edema.  Lymphadenopathy:    No cervical adenopathy.  Neurological: Alert and oriented to person, place, and time. Exhibits muscle relaxer. Gait normal. Coordination normal.  Skin: Skin is warm and dry. No rash noted. Not diaphoretic. No erythema. No pallor.  Psychiatric: Mood, memory and judgment normal.  Vitals reviewed.  LABORATORY DATA: Lab Results  Component Value Date   WBC 4.4 08/23/2023   HGB 11.6 (L) 08/23/2023   HCT 36.0 08/23/2023   MCV 82.6 08/23/2023   PLT 149 (L) 08/23/2023      Chemistry      Component Value Date/Time   NA 139  08/23/2023 0853   NA 140 03/04/2021 1516   K 3.8 08/23/2023 0853   CL 106 08/23/2023 0853   CO2 27 08/23/2023 0853   BUN 11 08/23/2023 0853   BUN 21 03/04/2021 1516   CREATININE 1.16 (H) 08/23/2023 1610  Component Value Date/Time   CALCIUM 8.8 (L) 08/23/2023 0853   ALKPHOS 65 08/23/2023 0853   AST 19 08/23/2023 0853   ALT 9 08/23/2023 0853   BILITOT 0.4 08/23/2023 0853       RADIOGRAPHIC STUDIES:  NM PET Image Initial (PI) Skull Base To Thigh Result Date: 08/16/2023 CLINICAL DATA:  Subsequent treatment strategy for small cell lung cancer. EXAM: NUCLEAR MEDICINE PET SKULL BASE TO THIGH TECHNIQUE: 6.5 mCi F-18 FDG was injected intravenously. Full-ring PET imaging was performed from the skull base to thigh after the radiotracer. CT data was obtained and used for attenuation correction and anatomic localization. Fasting blood glucose: 109 mg/dl COMPARISON:  16/03/9603. FINDINGS: Mediastinal blood pool activity: SUV max 3.0 Liver activity: SUV max NA NECK: No abnormal hypermetabolism. Incidental CT findings: None. CHEST: Mildly hypermetabolic mediastinal lymph nodes. Index subcarinal lymph node measures approximately 1.3 cm, SUV max 5.6, compared to 8.6 previously. Mildly hypermetabolic parenchymal consolidation and retraction in the apical and medial upper right hemithorax. SUV max 6.9 in the central right lower lobe (4/73). No additional abnormal hypermetabolism. Incidental CT findings: Right IJ Port-A-Cath terminates at the SVC RA junction or high right atrium. Atherosclerotic calcification of the aorta. Heart is at the upper limits of normal in size. Perilymphatic nodularity and septal thickening throughout the right upper, right middle and right lower lobes with a thin rind of mildly hypermetabolic pleural fluid/thickening in the right hemithorax. Trace left pleural effusion. ABDOMEN/PELVIS: No abnormal hypermetabolism. Incidental CT findings: None. SKELETON: No abnormal hypermetabolism.  Incidental CT findings: Degenerative changes in the spine. IMPRESSION: 1. Post treatment parenchymal consolidation and retraction with hypermetabolism in the central right lower lobe, worrisome for disease recurrence. Perilymphatic nodularity and thickening in the right hemithorax, most indicative of lymphangitic carcinomatosis. Associated hypermetabolic mediastinal lymph nodes. 2. Likely malignant rind of pleural fluid/thickening in the right hemithorax. 3. Trace left pleural fluid. 4.  Aortic atherosclerosis (ICD10-I70.0). Electronically Signed   By: Leanna Battles M.D.   On: 08/16/2023 14:22   DG Chest 2 View Result Date: 08/06/2023 CLINICAL DATA:  Shortness of breath with exertion EXAM: CHEST - 2 VIEW COMPARISON:  Chest radiograph 01/17/2022 FINDINGS: Stable cardiac and mediastinal contours. Port-A-Cath tip projects over the superior vena cava. Stable consolidation right upper hemithorax. Similar opacities throughout the right mid and lower lung which may represent lymphangitic spread of tumor. Small right pleural effusion. No pneumothorax. Thoracic spine degenerative changes. Clear left lung. IMPRESSION: 1. Stable consolidation right upper hemithorax. 2. Similar opacities throughout the right mid and lower lung which may represent lymphangitic spread of tumor. 3. Small right pleural effusion. Electronically Signed   By: Annia Belt M.D.   On: 08/06/2023 13:02   CT CHEST ABDOMEN PELVIS W CONTRAST Result Date: 08/01/2023 CLINICAL DATA:  Non-small cell lung cancer.  * Tracking Code: BO * EXAM: CT CHEST, ABDOMEN, AND PELVIS WITH CONTRAST TECHNIQUE: Multidetector CT imaging of the chest, abdomen and pelvis was performed following the standard protocol during bolus administration of intravenous contrast. RADIATION DOSE REDUCTION: This exam was performed according to the departmental dose-optimization program which includes automated exposure control, adjustment of the mA and/or kV according to patient size  and/or use of iterative reconstruction technique. CONTRAST:  80mL OMNIPAQUE IOHEXOL 300 MG/ML  SOLN COMPARISON:  04/25/2023. FINDINGS: CT CHEST FINDINGS Cardiovascular: Right IJ Port-A-Cath terminates in the right atrium. Atherosclerotic calcification of the aorta and coronary arteries. Heart is at the upper limits of normal in size. Left ventricle appears somewhat dilated. No pericardial  effusion. Mediastinum/Nodes: No pathologically enlarged mediastinal, hilar or axillary lymph nodes. Soft tissue thickening in the right hilum. Esophagus is grossly unremarkable. Lungs/Pleura: Post treatment consolidation, bronchiectasis and volume loss in the right upper lobe and perihilar right lower lobe. Septal thickening and nodularity throughout the right hemithorax, new or progressive from 04/25/2023. Thin rind of pleural fluid in the right hemithorax, similar. No suspicious pulmonary nodules in the left lung. Trace left pleural fluid. Airway is unremarkable. Musculoskeletal: Degenerative changes in the spine. No worrisome lytic or sclerotic lesions. CT ABDOMEN PELVIS FINDINGS Hepatobiliary: Liver and gallbladder are unremarkable. No biliary ductal dilatation. Pancreas: Negative. Spleen: Negative. Adrenals/Urinary Tract: Adrenal glands are unremarkable. Mild renal cortical scarring bilaterally. Subcentimeter low-attenuation lesion in the right kidney, too small to characterize. No specific follow-up necessary. Ureters are decompressed. Bladder is low in volume. Stomach/Bowel: Stomach, small bowel and colon are unremarkable. Appendix is not readily visualized. Vascular/Lymphatic: Atherosclerotic calcification of the aorta. No pathologically enlarged lymph nodes. Reproductive: Hysterectomy.  No adnexal mass. Other: Trace pelvic free fluid. Mesenteries and peritoneum are unremarkable. Musculoskeletal: Osteopenia. Degenerative changes in the spine. Grade 1 anterolisthesis of L5 on S1. IMPRESSION: 1. Post radiation consolidation  in the upper right hemithorax. New or increasing septal thickening and perilymphatic nodularity throughout the right hemithorax, highly worrisome for lymphangitic carcinomatosis. 2. Thin rind of pleural fluid in the right hemithorax, stable. Trace left pleural effusion. 3. Aortic atherosclerosis (ICD10-I70.0). Coronary artery calcification. Electronically Signed   By: Leanna Battles M.D.   On: 08/01/2023 16:59     ASSESSMENT/PLAN:  This is a very pleasant 73 year old never smoker African-American female diagnosed with stage IV non-small cell lung cancer, adenocarcinoma.  She was positive for an EGFR mutation with deletion in exon 19.  She was diagnosed in July 2019 and presented with right upper lobe lung mass in addition to mediastinal lymphadenopathy as well as bilateral pulmonary nodules and malignant right pleural effusion.   The patient was started on treatment with Tagrisso 80 mg p.o. daily status post 39 months of treatment. Started on 01/29/2018.   In April 2022, she showed evidence of disease progression with interval progression of the right apical lung mass in addition to progression of mediastinal lymphadenopathy concerning for worsening of her disease. She had repeat Guardant 360 molecular studies which did not show evidence of new resistant mutations.  Therefore, she was referred to radiation oncology and completed palliative radiotherapy to the enlarging right upper lobe lung mass and mediastinum under the care of Dr. Roselind Messier.  This was completed in July 2022.   The patient underwent a repeat bronchoscopy and biopsy for repeat molecular testing.  The sample from the left and right lung biopsy was negative for malignancy. This was performed in November 2022.   Unfortunately, the patient  was found to have evidence of disease progression.  Therefore, Dr. [name] started the patient on systemic chemotherapy with carboplatin for AUC of 5 and Alimta 500 mg per metered squared.  She is status  post 23 cycles and she tolerated it fairly well despite her ongoing issues with fatigue, chest tightness, cough, and weight loss.  Alimta was reduced to 400 mg per metered squared due to renal insufficiency.  She is not a good candidate for Avastin due to her recent CVA in August 2022.  She also is continuing to take Tagrisso as it is protective against progressive metastatic disease to the brain. Starting from cycle #7,  she started maintenance single agent alimta. Her treatment has been on hold  since March 2024 due to side effects of treatment.    Of note, the patient saw Dr. Eber Hong from [facility] for second opinion in November 2022.  They discussed if she progresses on chemotherapy that there may be upcoming options. He discussed other treatment options which may be available in 2023 including patritumab deruxtcan and lazertinib/amivantmab. If these are not available, he wrote that they can determine if they can get the agents through an expanded access, single patient IND, or clinical trial if she has progression with chemotherapy.    She has been off treatment for approximately 1 year due to intolerance and fatigue.  Her most recent restaging CT scan showed disease progression with new and increasing septal thickening and perilymphatic nodularity throughout the right hemithorax suspicious for lymphangitic spread of tumor.  Therefore Dr. [name] arrange for a PET scan.  The patient was seen with Dr. [name] today.  Dr. [name] personally and independently reviewed the PET scan and discussed results with the patient today.    Dr. [name] had a lengthy discussion with the patient today about her current condition and recommended treatment options. Dr. [name] recommends repeating molecular studies to see if she has developed any additional/resistent mutations. We will send this off via [facility].  Dr. [name] chemotherapy with or without carboplatin for an AUC of 5 and Alimta 500  mg/m. The other option is amivantamab and lezartanib but Dr. [name] is concerned about side effects and quality of life.   The patient is interested in this option and she is expected to undergo her first cycle of treatment on 09/06/23 as long as she did not develop additional mutations. We will see her back before her first infusion to review the molecular studies. The patient does declined carboplatin and she would like to proceed with single agent Alimta due to concerns with quality of life.   The adverse side effects of treatment were discussed including but not limited to myelosuppression, fatigue, infusion reaction, nausea, vomiting, rashes, kidney, and liver dysfunction.  I sent prescription for folic acid and Compazine to the patient's pharmacy. I also send her decadron pre-medications and medrol dosepack.  She will receive a B12 injection in the clinic today.  She will continue Tagrisso.   We will see her back for follow-up visit in 2 weeks before undergoing day 1 cycle #1.   Non-small cell lung cancer Diffuse nodules and thickening unsuitable for surgery or localized radiation. Tagrisso effective, but systemic options needed. Alimta considered if no new mutations. Amivantamab and lezartanib less favorable due to side effects and insurance issues. Molecular study to guide treatment. - Continue Tagrisso therapy. - Order molecular study for new mutations. - Administer Alimta chemotherapy if no new mutations are found. - Consider adding carboplatin to Alimta if tolerated. - Administer B12 shot and continue folic acid supplementation. - Discuss potential side effects with amivantamab and lezartanib.  Cough and weakness Persistent cough and weakness likely related to lung cancer. Cough contributes to fatigue. No fever or significant night sweats. - Continue hydrocodone syrup for cough. - Continue Bevespi inhaler for dyspnea. - Administer short course of steroids to boost energy and  appetite.  Fatigue and appetite loss Fatigue and appetite loss likely multifactorial due to cancer and treatment. Slightly low hemoglobin levels. - Administer short course of steroids to improve appetite and energy. - Monitor hemoglobin levels. - Prescribe mouth rinse for thrush.  The patient was advised to call immediately if she has any concerning symptoms in the  interval. The patient voices understanding of current disease status and treatment options and is in agreement with the current care plan. All questions were answered. The patient knows to call the clinic with any problems, questions or concerns. We can certainly see the patient much sooner if necessary     Orders Placed This Encounter  Procedures   Miscellaneous [facility] test (send-out)    Standing Status:   Future    Expected Date:   08/23/2023    Expiration Date:   08/22/2024    Test name / description::   [facility] liquid CDX      Alayne Estrella L Jashaun Penrose, PA-C 08/23/23  ADDENDUM: Hematology/Oncology Attending: I had a face-to-face encounter with the patient today.  I reviewed her records, lab, scan and recommended her care plan.  This is a very pleasant 73 years old African-American female with history of stage IV non-small cell lung cancer, adenocarcinoma diagnosed in July 2019 with positive EGFR exon 19 deletion.  The patient was treated initially with single agent Tagrisso 80 mg p.o. daily since August 2019 status post 66 months of treatment.  During her treatment she had evidence for disease progression and she was treated with palliative radiotherapy to enlarging right upper lobe lung mass and mediastinal lymphadenopathy in July 2022.  She was then treated with additional systemic chemotherapy with carboplatin and Alimta in addition to her treatment with Tagrisso initially for 4 cycles followed by maintenance treatment with Alimta and Tagrisso with a total cycle of 26 of Alimta discontinued based on her request and  secondary to increasing fatigue.  She continued single agent Tagrisso at that time. The patient has been complaining of increasing fatigue and weakness as well as worsening dyspnea recently.  She had recent imaging studies including CT scan of the chest as well as a PET scan.  I personally and independently reviewed the PET scan images and discussed the result with the patient and her family today.  Her PET scan showed posttreatment parenchymal consolidation and retraction with hypermetabolism in the central right lower lobe worrisome for disease recurrence.  There was perilymphatic nodularity and thickening in the right hemothorax suspicious for lymphangitic carcinomatosis with associated hypermetabolic mediastinal lymph nodes.  There was also likely malignant trend of pleural fluid/thickening in the right hemothorax and trace left pleural fluid. I had a lengthy discussion with the patient and her family today about her current condition and treatment options.  I discussed with the patient resuming additional systemic chemotherapy with either carboplatin and Alimta or single agent Alimta to her current oral treatment with Tagrisso versus consideration of treatment with Amivantamab and systemic chemotherapy or Larzetinib.  We also discussed sending blood test to [facility] for molecular studies and to rule out the development of any resistant or additional actionable mutation. The patient is interested in resuming treatment with with chemotherapy with only single agent Alimta.  I will arrange for her to receive the first cycle of this treatment on September 06, 2023 after the availability of the molecular markers.  She will receive vitamin B12 injection today and she was advised to continue her current treatment with folic acid and Decadron. The patient will come back for follow-up visit at that time. She was advised to call immediately if she has any other concerning symptoms in the interval. The total time  spent in the appointment was 30 minutes. Disclaimer: This note was dictated with voice recognition software. Similar sounding words can inadvertently be transcribed and may be missed upon  review. [name], MD

## 2023-08-22 ENCOUNTER — Other Ambulatory Visit: Payer: Self-pay | Admitting: *Deleted

## 2023-08-22 DIAGNOSIS — C3491 Malignant neoplasm of unspecified part of right bronchus or lung: Secondary | ICD-10-CM

## 2023-08-22 NOTE — Progress Notes (Unsigned)
 Palliative Medicine [facility]  [phone] [phone]) [phone]   Name: Allison [name] Date: 08/22/2023 MRN: [id number]  DOB: 13-Jan-1951  Patient Care Team: [name], MD as PCP - General (Internal Medicine) Little Ishikawa, MD as PCP - Cardiology (Cardiology) Antony Blackbird, MD as Consulting Physician (Radiation Oncology) Pickenpack-Cousar, Arty Baumgartner, NP as Nurse Practitioner (Nurse Practitioner) Syliva Overman, RN as Oncology Nurse Navigator (Oncology) Si Gaul, MD as Consulting Physician (Oncology) Kermit Balo, DO (Geriatric Medicine)   INTERVAL HISTORY: Allison [name] is a 73 y.o. female with stage IV non-small cell lung cancer (12/2017), hypertension, CVA, CAD, DVT/PE (on Lovenox), and GERD.  Palliative ask to see for symptom management and goals of care.  SOCIAL HISTORY:     reports that she has never smoked. She has never used smokeless tobacco. She reports that she does not currently use alcohol. She reports that she does not use drugs.  ADVANCE DIRECTIVES:  None on file   CODE STATUS: DNR  PAST MEDICAL HISTORY: Past Medical History:  Diagnosis Date   Anemia    Anxiety    Arthritis    Asthma    exercise induced   Coronary artery disease    Depression    PMH   Dyspnea    GERD (gastroesophageal reflux disease)    Glaucoma    History of radiation therapy 01/05/2021   IMRT right lung  11/24/2020-01/05/2021  Dr Antony Blackbird   Hypertension    lung ca dx'd 11/2017   right   Malignant pleural effusion    right   Nuclear sclerotic cataract of right eye 02/28/2021   Dr. Sallye Lat, cataract surgery February 2023   PONV (postoperative nausea and vomiting)    Pre-diabetes    Raynaud's disease    Raynaud's disease    Stroke (HCC) 01/2021   balance off, some express aphasia, weakness    ALLERGIES:  is allergic to hydrocodone-acetaminophen, penicillins, lactose, vicodin [hydrocodone-acetaminophen],  bacid, and other.  MEDICATIONS:  Current Outpatient Medications  Medication Sig Dispense Refill   acetaminophen (TYLENOL) 500 MG tablet Take 1,000 mg by mouth every 6 (six) hours as needed (pain).     ALPRAZolam (XANAX) 0.25 MG tablet Take 1 tablet (0.25 mg total) by mouth 2 (two) times daily as needed for anxiety. May take 2 tablets (0.5 mg total) at bedtime. 60 tablet 0   amLODipine (NORVASC) 5 MG tablet Take 1 tablet (5 mg total) by mouth daily. 90 tablet 3   benzonatate (TESSALON) 100 MG capsule Take 2 capsules (200 mg total) by mouth 3 (three) times daily as needed for cough. 60 capsule 2   carvedilol (COREG) 12.5 MG tablet Take 1 tablet (12.5 mg total) by mouth 2 (two) times daily with a meal. 180 tablet 3   CHELATED MAGNESIUM PO Take 200 mg by mouth daily.     Cholecalciferol (VITAMIN D) 125 MCG (5000 UT) CAPS Take 1 Capful by mouth daily.     docusate sodium (COLACE) 100 MG capsule Take 1 capsule (100 mg total) by mouth 2 (two) times daily. 60 capsule 5   dorzolamide-timolol (COSOPT) 22.3-6.8 MG/ML ophthalmic solution Place 1 drop into both eyes 2 (two) times daily.     doxycycline (VIBRAMYCIN) 100 MG capsule Take 1 capsule (100 mg total) by mouth 2 (two) times daily for 7 days. 14 capsule 0   enoxaparin (LOVENOX) 80 MG/0.8ML injection Inject 80 mg into the skin daily.     ezetimibe (ZETIA)  10 MG tablet Take 10 mg by mouth daily.     FeFum-FePoly-FA-B Cmp-C-Biot (FOLIVANE-PLUS) CAPS Take 1 capsule by mouth in the morning. 90 capsule 0   FLUoxetine (PROZAC) 20 MG capsule Take 1 capsule (20 mg total) by mouth daily. 90 capsule 3   folic acid (FOLVITE) 1 MG tablet TAKE 1 TABLET BY MOUTH EVERY DAY 90 tablet 1   Glycopyrrolate-Formoterol (BEVESPI AEROSPHERE) 9-4.8 MCG/ACT AERO Inhale 2 puffs into the lungs 2 (two) times daily. 10.7 g 3   HYDROcodone bit-homatropine (HYCODAN) 5-1.5 MG/5ML syrup Take 5 mLs by mouth every 6 (six) hours as needed for cough. 240 mL 0   hyoscyamine (LEVSIN SL)  0.125 MG SL tablet Place 1-2 tablets (0.125-0.25mg ) under the tongue every 6 (six) hours as needed for cramping. 30 tablet 3   ipratropium-albuterol (DUONEB) 0.5-2.5 (3) MG/3ML SOLN Take 3 mLs by nebulization every 6 (six) hours as needed (Asthma). 360 mL 3   isosorbide mononitrate (IMDUR) 30 MG 24 hr tablet Take 0.5 tablets (15 mg total) by mouth daily. 90 tablet 3   latanoprost (XALATAN) 0.005 % ophthalmic solution Place 1 drop into both eyes at bedtime.      levofloxacin (LEVAQUIN) 750 MG tablet Take 1 tablet (750 mg total) by mouth every other day. 4 tablet 0   methylPREDNISolone (MEDROL) 4 MG TBPK tablet Take as directed per 6 day dose pack instructions 21 tablet 0   ondansetron (ZOFRAN-ODT) 4 MG disintegrating tablet Take 1 tablet (4 mg total) by mouth every 8 (eight) hours as needed for nausea or vomiting. 30 tablet 1   osimertinib mesylate (TAGRISSO) 80 MG tablet Take 1 tablet (80 mg total) by mouth daily. 30 tablet 4   prochlorperazine (COMPAZINE) 10 MG tablet Take 1 tablet (10 mg total) by mouth every 6 (six) hours as needed. 30 tablet 2   REPATHA SURECLICK 140 MG/ML SOAJ Inject 140 mg into the skin every 14 (fourteen) days. 6 mL 3   No current facility-administered medications for this visit.    VITAL SIGNS: LMP  (LMP Unknown)  There were no vitals filed for this visit.   Estimated body mass index is 22.52 kg/m as calculated from the following:   Height as of 08/20/23: 5\' 4"  (1.626 m).   Weight as of 08/20/23: 131 lb 3.2 oz (59.5 kg).   PERFORMANCE STATUS (ECOG) : 1 - Symptomatic but completely ambulatory  Assessment NAD, fatigue RRR Diminished bilaterally, cough (dry) AAO x3  Discussed the use of AI scribe software for clinical note transcription with the patient, who gave verbal consent to proceed.  IMPRESSION:  Allison [name] is a 73 year old female with metastatic NSCLC who presents with fatigue and cough. She is accompanied by her family members, including her  daughter. Patient is resting comfortably however with noticeably dry cough. She states that the coughing is frustrating as it is persistent and also keeps her awake. She denies concerns for nausea, vomiting, constipation, or diarrhea. She is spending more time in bed or in chair due to her ongoing fatigue. Reports she has "little" energy to do anything. States she becomes winded with minimal exertion, such as walking from the bedroom to the kitchen.   Since January 20th, she has had this persistent cough. Initially, she was treated with two rounds of antibiotics for suspected bronchitis. She was seen by her Oncologist today to review recent PET scan which unfortunately revealed disease progression, attributing the cough to the spread of her condition. Patient and family express  clear understanding of current state of health. Per Oncology she will start on dexamethasone daily for support.   Her appetite is poor, but she is trying to improve her caloric intake. She has been consuming fluids, including water and tea, to stay hydrated.  Yamel reports some chest wall discomfort in addition to upper abdominal pain that radiates around her body similar to a band. She attributes this discomfort to her persistent cough and muscle strain. She uses Hycodan cough syrup. Pain previously managed with Tylenol use which she has not been taking. She plans to restart as needed.    Goals of Care We discussed at length Ms. Curless's recent diagnosis of disease reoccurrence. She is realistic in her understanding. Tekela and her family are clear in their expressed wishes to continue to treat the treatable allowing her every opportunity to thrive. She mentions plans to restart treatment with Alimta while awaiting pending genetic/mutation results to determine if other interventions would be more appropriate. She is emotional speaking of a recent friend who passed away with similar cancer and the struggles she had with certain  treatments. Ms. Delgreco expresses no desire to receive Carboplatin as part of her regimen. We discussed continuing to take things one day at a time. She is remaining hopeful.   All questions answered and support provided.  Assessment and Plan  Metastatic Lung Cancer Progression of disease with increased cough and fatigue. Patient had visit with Oncology today revealing cancer occurrence on recent PET Scan. She and family are realistic in their understanding.  -Patient states plans to pursue Alimta as discussed with Oncology.  -Start dexamethasone twice daily to help with inflammation, cough, and appetite per Oncology.  -Continue Hycodan for cough control. -Acetaminophen as needed for pain control.  Goals of Care Extensive goals of care discussions in the setting of cancer reoccurrence. Latroya and her family are clear in their expressed wishes to continue to treat the treatable allowing her every opportunity to thrive. She mentions plans to restart treatment with Alimta as discussed with Oncologist. Not interested in pursuing Carboplatin as part of treatment regimen.   General Health Maintenance -I will plan to follow up with patient in 2-3 weeks. Sooner if needed.  Patient expressed understanding and was in agreement with this plan. She also understands that She can call the clinic at any time with any questions, concerns, or complaints.     Visit consisted of counseling and education dealing with the complex and emotionally intense issues of symptom management and palliative care in the setting of serious and potentially life-threatening illness.  Willette Alma, AGPCNP-BC  Palliative Medicine Team/[facility]

## 2023-08-23 ENCOUNTER — Inpatient Hospital Stay: Payer: Medicare HMO | Admitting: Physician Assistant

## 2023-08-23 ENCOUNTER — Encounter: Payer: Self-pay | Admitting: Physician Assistant

## 2023-08-23 ENCOUNTER — Inpatient Hospital Stay

## 2023-08-23 ENCOUNTER — Ambulatory Visit

## 2023-08-23 ENCOUNTER — Encounter: Payer: Self-pay | Admitting: Internal Medicine

## 2023-08-23 ENCOUNTER — Encounter: Payer: Self-pay | Admitting: Nurse Practitioner

## 2023-08-23 ENCOUNTER — Inpatient Hospital Stay (HOSPITAL_BASED_OUTPATIENT_CLINIC_OR_DEPARTMENT_OTHER): Payer: Medicare HMO | Admitting: Nurse Practitioner

## 2023-08-23 ENCOUNTER — Inpatient Hospital Stay: Payer: Medicare HMO | Attending: Internal Medicine

## 2023-08-23 ENCOUNTER — Other Ambulatory Visit (HOSPITAL_COMMUNITY): Payer: Self-pay

## 2023-08-23 VITALS — BP 173/93 | HR 98 | Temp 98.2°F | Resp 18 | Wt 129.9 lb

## 2023-08-23 DIAGNOSIS — Z79899 Other long term (current) drug therapy: Secondary | ICD-10-CM | POA: Diagnosis not present

## 2023-08-23 DIAGNOSIS — C3490 Malignant neoplasm of unspecified part of unspecified bronchus or lung: Secondary | ICD-10-CM

## 2023-08-23 DIAGNOSIS — B370 Candidal stomatitis: Secondary | ICD-10-CM

## 2023-08-23 DIAGNOSIS — E860 Dehydration: Secondary | ICD-10-CM | POA: Diagnosis not present

## 2023-08-23 DIAGNOSIS — C3491 Malignant neoplasm of unspecified part of right bronchus or lung: Secondary | ICD-10-CM

## 2023-08-23 DIAGNOSIS — Z95828 Presence of other vascular implants and grafts: Secondary | ICD-10-CM

## 2023-08-23 DIAGNOSIS — Z7952 Long term (current) use of systemic steroids: Secondary | ICD-10-CM | POA: Diagnosis not present

## 2023-08-23 DIAGNOSIS — J910 Malignant pleural effusion: Secondary | ICD-10-CM | POA: Insufficient documentation

## 2023-08-23 DIAGNOSIS — C3411 Malignant neoplasm of upper lobe, right bronchus or lung: Secondary | ICD-10-CM | POA: Diagnosis present

## 2023-08-23 DIAGNOSIS — Z7189 Other specified counseling: Secondary | ICD-10-CM

## 2023-08-23 DIAGNOSIS — Z515 Encounter for palliative care: Secondary | ICD-10-CM

## 2023-08-23 DIAGNOSIS — R197 Diarrhea, unspecified: Secondary | ICD-10-CM | POA: Insufficient documentation

## 2023-08-23 DIAGNOSIS — Z5111 Encounter for antineoplastic chemotherapy: Secondary | ICD-10-CM | POA: Insufficient documentation

## 2023-08-23 DIAGNOSIS — R530 Neoplastic (malignant) related fatigue: Secondary | ICD-10-CM

## 2023-08-23 DIAGNOSIS — G893 Neoplasm related pain (acute) (chronic): Secondary | ICD-10-CM | POA: Insufficient documentation

## 2023-08-23 LAB — CBC WITH DIFFERENTIAL (CANCER CENTER ONLY)
Abs Immature Granulocytes: 0 10*3/uL (ref 0.00–0.07)
Basophils Absolute: 0 10*3/uL (ref 0.0–0.1)
Basophils Relative: 0 %
Eosinophils Absolute: 0.2 10*3/uL (ref 0.0–0.5)
Eosinophils Relative: 4 %
HCT: 36 % (ref 36.0–46.0)
Hemoglobin: 11.6 g/dL — ABNORMAL LOW (ref 12.0–15.0)
Immature Granulocytes: 0 %
Lymphocytes Relative: 23 %
Lymphs Abs: 1 10*3/uL (ref 0.7–4.0)
MCH: 26.6 pg (ref 26.0–34.0)
MCHC: 32.2 g/dL (ref 30.0–36.0)
MCV: 82.6 fL (ref 80.0–100.0)
Monocytes Absolute: 0.5 10*3/uL (ref 0.1–1.0)
Monocytes Relative: 11 %
Neutro Abs: 2.7 10*3/uL (ref 1.7–7.7)
Neutrophils Relative %: 62 %
Platelet Count: 149 10*3/uL — ABNORMAL LOW (ref 150–400)
RBC: 4.36 MIL/uL (ref 3.87–5.11)
RDW: 13.1 % (ref 11.5–15.5)
WBC Count: 4.4 10*3/uL (ref 4.0–10.5)
nRBC: 0 % (ref 0.0–0.2)

## 2023-08-23 LAB — CMP (CANCER CENTER ONLY)
ALT: 9 U/L (ref 0–44)
AST: 19 U/L (ref 15–41)
Albumin: 3.4 g/dL — ABNORMAL LOW (ref 3.5–5.0)
Alkaline Phosphatase: 65 U/L (ref 38–126)
Anion gap: 6 (ref 5–15)
BUN: 11 mg/dL (ref 8–23)
CO2: 27 mmol/L (ref 22–32)
Calcium: 8.8 mg/dL — ABNORMAL LOW (ref 8.9–10.3)
Chloride: 106 mmol/L (ref 98–111)
Creatinine: 1.16 mg/dL — ABNORMAL HIGH (ref 0.44–1.00)
GFR, Estimated: 50 mL/min — ABNORMAL LOW (ref >60)
Glucose, Bld: 107 mg/dL — ABNORMAL HIGH (ref 70–99)
Potassium: 3.8 mmol/L (ref 3.5–5.1)
Sodium: 139 mmol/L (ref 135–145)
Total Bilirubin: 0.4 mg/dL (ref 0.0–1.2)
Total Protein: 6.4 g/dL — ABNORMAL LOW (ref 6.5–8.1)

## 2023-08-23 LAB — MISCELLANEOUS TEST

## 2023-08-23 MED ORDER — SODIUM CHLORIDE 0.9% FLUSH
10.0000 mL | Freq: Once | INTRAVENOUS | Status: AC
  Administered 2023-08-23: 10 mL

## 2023-08-23 MED ORDER — PROCHLORPERAZINE MALEATE 10 MG PO TABS
10.0000 mg | ORAL_TABLET | Freq: Four times a day (QID) | ORAL | 2 refills | Status: DC | PRN
  Filled 2023-08-23: qty 30, 8d supply, fill #0

## 2023-08-23 MED ORDER — HEPARIN SOD (PORK) LOCK FLUSH 100 UNIT/ML IV SOLN
500.0000 [IU] | Freq: Once | INTRAVENOUS | Status: AC
  Administered 2023-08-23: 500 [IU] via INTRAVENOUS

## 2023-08-23 MED ORDER — NYSTATIN 100000 UNIT/ML MT SUSP
5.0000 mL | Freq: Four times a day (QID) | OROMUCOSAL | 0 refills | Status: DC
  Filled 2023-08-23: qty 60, 3d supply, fill #0

## 2023-08-23 MED ORDER — CYANOCOBALAMIN 1000 MCG/ML IJ SOLN
1000.0000 ug | Freq: Once | INTRAMUSCULAR | Status: AC
Start: 2023-08-23 — End: 2023-08-23
  Administered 2023-08-23: 1000 ug via INTRAMUSCULAR
  Filled 2023-08-23: qty 1

## 2023-08-23 MED ORDER — METHYLPREDNISOLONE 4 MG PO TBPK
ORAL_TABLET | ORAL | 0 refills | Status: DC
Start: 2023-08-23 — End: 2023-09-04
  Filled 2023-08-23: qty 21, 6d supply, fill #0

## 2023-08-23 MED ORDER — DEXAMETHASONE 4 MG PO TABS
4.0000 mg | ORAL_TABLET | Freq: Two times a day (BID) | ORAL | 2 refills | Status: AC
  Filled 2023-08-23: qty 40, 20d supply, fill #0
  Filled 2024-07-18: qty 40, 20d supply, fill #1

## 2023-08-24 ENCOUNTER — Encounter: Payer: Self-pay | Admitting: Internal Medicine

## 2023-08-24 ENCOUNTER — Encounter: Payer: Self-pay | Admitting: Physician Assistant

## 2023-08-24 NOTE — Progress Notes (Signed)
 Entered in error

## 2023-08-24 NOTE — Progress Notes (Signed)
 I saw pt today at her follow up appt with C.Heilingoetter, Onc PA And Dr.Mohamed to review recent PET scan images. Pt is accompanied by her partner, [name], and youngest daughter, [name].   Based on the results, Dr [name] is recommending adding Carbo/Alimta back to her current regimen of oral Tagrisso. Pt agreed to recommendation. Dr [name] requested pt's blood be sent to [facility] for molecular testing. I provided [facility] Liquid CDx kit to Terald Sleeper, Charity fundraiser. Lab ordered in Epic by C.Heilingoetter, Onc PA.  I placed the order for the testing via the [employer]. FM order confirmation [id number] received via email.

## 2023-08-28 ENCOUNTER — Ambulatory Visit: Payer: Medicare HMO | Admitting: Primary Care

## 2023-08-28 ENCOUNTER — Encounter: Payer: Self-pay | Admitting: Primary Care

## 2023-08-28 ENCOUNTER — Encounter: Payer: Self-pay | Admitting: General Practice

## 2023-08-28 VITALS — BP 158/84 | HR 72 | Ht 64.0 in | Wt 127.2 lb

## 2023-08-28 DIAGNOSIS — R058 Other specified cough: Secondary | ICD-10-CM

## 2023-08-28 DIAGNOSIS — C3491 Malignant neoplasm of unspecified part of right bronchus or lung: Secondary | ICD-10-CM

## 2023-08-28 DIAGNOSIS — R5383 Other fatigue: Secondary | ICD-10-CM | POA: Diagnosis not present

## 2023-08-28 NOTE — Progress Notes (Signed)
 @Patient  ID: Allison [name], female    DOB: 03/06/1951, 73 y.o.   MRN: [id number]  Chief Complaint  Patient presents with   Follow-up    Referring provider: [name], MD  HPI: 73 year old female, never smoked. PMH significant for DVT, aortic atherosclerosis, CAD, CVA, PE, NSCLC, pleural effusion, postobstructive pneumonia, GERD, NSTEMI, vit D deficiency.  08/28/2023 Discussed the use of AI scribe software for clinical note transcription with the patient, who gave verbal consent to proceed.  History of Present Illness   The patient, with lung cancer, presents with concerns of recurrence and associated symptoms.  She has a history of lung cancer initially diagnosed in 2019, treated with radiation, and chemotherapy. She is currently on Tagrisso daily. A recent PET scan showed post-treatment parenchymal consolidation and retraction of hypermetabolism in the central right lower lobe. There is also perilymphatic nodularity and thickening in the right hemothorax. She is scheduled to start Alimta IV treatment on March 20th and has been in contact with her oncologist.  She experiences a dry cough, sometimes producing a small amount of blood, particularly after activities like showering. The cough worsens with movement and talking but is less frequent when inactive. She uses Hycodan cough syrup as needed, taking it more frequently when active to manage her cough.  She reports significant fatigue and weakness, particularly noted last Monday. She has been treated with antibiotics twice and is currently on her third prednisone taper, which has provided some improvement in energy levels. She also uses Bevespi inhaler twice daily, which she feels has contributed to her improvement.  She has experienced acid reflux, which she attributes to prednisone use. She is not currently on omeprazole. She reports a decreased appetite, which she associates with her reflux.  She monitors her blood pressure,  which tends to rise with coughing, and notes that her heart rate increases during these episodes. She does not use supplemental oxygen and reports her oxygen levels are typically in the high nineties.       Allergies  Allergen Reactions   Hydrocodone-Acetaminophen Other (See Comments)   Penicillins Other (See Comments)    SYNCOPE PATIENT HAS HAD A PCN REACTION WITH IMMEDIATE RASH, FACIAL/TONGUE/THROAT SWELLING, SOB, OR LIGHTHEADEDNESS WITH HYPOTENSION:  #  #  YES  #  # Has patient had a PCN reaction causing severe rash involving mucus membranes or skin necrosis: No Has patient had a PCN reaction that required hospitalization: No Has patient had a PCN reaction occurring within the last 10 years: No  Other reaction(s): other   Lactose Other (See Comments)    Allergy to all dairy products Abdominal cramping   Vicodin [Hydrocodone-Acetaminophen] Other (See Comments)    Sped up heart and breathing   Bacid Nausea And Vomiting   Other Other (See Comments)    Glaucoma eye drop - turned eyes dark    Immunization History  Administered Date(s) Administered   Fluad Quad(high Dose 65+) 03/24/2019, 05/04/2020, 03/18/2021, 04/27/2022   Fluad Trivalent(High Dose 65+) 05/02/2023   Influenza, High Dose Seasonal PF 04/03/2019   PFIZER(Purple Top)SARS-COV-2 Vaccination 05/31/2020   Tdap 11/17/2012, 11/26/2012    Past Medical History:  Diagnosis Date   Anemia    Anxiety    Arthritis    Asthma    exercise induced   Coronary artery disease    Depression    PMH   Dyspnea    GERD (gastroesophageal reflux disease)    Glaucoma    History of radiation therapy 01/05/2021  IMRT right lung  11/24/2020-01/05/2021  Dr Antony Blackbird   Hypertension    lung ca dx'd 11/2017   right   Malignant pleural effusion    right   Nuclear sclerotic cataract of right eye 02/28/2021   Dr. Sallye Lat, cataract surgery February 2023   PONV (postoperative nausea and vomiting)    Pre-diabetes    Raynaud's  disease    Raynaud's disease    Stroke (HCC) 01/2021   balance off, some express aphasia, weakness    Tobacco History: Social History   Tobacco Use  Smoking Status Never  Smokeless Tobacco Never   Counseling given: Not Answered   Outpatient Medications Prior to Visit  Medication Sig Dispense Refill   acetaminophen (TYLENOL) 500 MG tablet Take 1,000 mg by mouth every 6 (six) hours as needed (pain).     ALPRAZolam (XANAX) 0.25 MG tablet Take 1 tablet (0.25 mg total) by mouth 2 (two) times daily as needed for anxiety. May take 2 tablets (0.5 mg total) at bedtime. 60 tablet 0   amLODipine (NORVASC) 5 MG tablet Take 1 tablet (5 mg total) by mouth daily. 90 tablet 3   benzonatate (TESSALON) 100 MG capsule Take 2 capsules (200 mg total) by mouth 3 (three) times daily as needed for cough. 60 capsule 2   carvedilol (COREG) 12.5 MG tablet Take 1 tablet (12.5 mg total) by mouth 2 (two) times daily with a meal. 180 tablet 3   CHELATED MAGNESIUM PO Take 200 mg by mouth daily.     Cholecalciferol (VITAMIN D) 125 MCG (5000 UT) CAPS Take 1 Capful by mouth daily.     dexamethasone (DECADRON) 4 MG tablet Take 1 tablet twice a day the day before, day of, and day after treatment 40 tablet 2   docusate sodium (COLACE) 100 MG capsule Take 1 capsule (100 mg total) by mouth 2 (two) times daily. 60 capsule 5   dorzolamide-timolol (COSOPT) 22.3-6.8 MG/ML ophthalmic solution Place 1 drop into both eyes 2 (two) times daily.     doxycycline (VIBRAMYCIN) 100 MG capsule Take 1 capsule (100 mg total) by mouth 2 (two) times daily for 7 days. 14 capsule 0   enoxaparin (LOVENOX) 80 MG/0.8ML injection Inject 80 mg into the skin daily.     ezetimibe (ZETIA) 10 MG tablet Take 10 mg by mouth daily.     FeFum-FePoly-FA-B Cmp-C-Biot (FOLIVANE-PLUS) CAPS Take 1 capsule by mouth in the morning. 90 capsule 0   FLUoxetine (PROZAC) 20 MG capsule Take 1 capsule (20 mg total) by mouth daily. 90 capsule 3   folic acid (FOLVITE) 1  MG tablet TAKE 1 TABLET BY MOUTH EVERY DAY 90 tablet 1   Glycopyrrolate-Formoterol (BEVESPI AEROSPHERE) 9-4.8 MCG/ACT AERO Inhale 2 puffs into the lungs 2 (two) times daily. 10.7 g 3   HYDROcodone bit-homatropine (HYCODAN) 5-1.5 MG/5ML syrup Take 5 mLs by mouth every 6 (six) hours as needed for cough. 240 mL 0   hyoscyamine (LEVSIN SL) 0.125 MG SL tablet Place 1-2 tablets (0.125-0.25mg ) under the tongue every 6 (six) hours as needed for cramping. 30 tablet 3   ipratropium-albuterol (DUONEB) 0.5-2.5 (3) MG/3ML SOLN Take 3 mLs by nebulization every 6 (six) hours as needed (Asthma). 360 mL 3   isosorbide mononitrate (IMDUR) 30 MG 24 hr tablet Take 0.5 tablets (15 mg total) by mouth daily. 90 tablet 3   latanoprost (XALATAN) 0.005 % ophthalmic solution Place 1 drop into both eyes at bedtime.      levofloxacin (LEVAQUIN) 750 MG  tablet Take 1 tablet (750 mg total) by mouth every other day. 4 tablet 0   methylPREDNISolone (MEDROL DOSEPAK) 4 MG TBPK tablet Take as directed. 21 tablet 0   nystatin (MYCOSTATIN) 100000 UNIT/ML suspension Take 5 mLs (500,000 Units total) by mouth 4 (four) times daily. 60 mL 0   ondansetron (ZOFRAN-ODT) 4 MG disintegrating tablet Take 1 tablet (4 mg total) by mouth every 8 (eight) hours as needed for nausea or vomiting. 30 tablet 1   osimertinib mesylate (TAGRISSO) 80 MG tablet Take 1 tablet (80 mg total) by mouth daily. 30 tablet 4   prochlorperazine (COMPAZINE) 10 MG tablet Take 1 tablet (10 mg total) by mouth every 6 (six) hours as needed. 30 tablet 2   prochlorperazine (COMPAZINE) 10 MG tablet Take 1 tablet (10 mg total) by mouth every 6 (six) hours as needed. 30 tablet 2   REPATHA SURECLICK 140 MG/ML SOAJ Inject 140 mg into the skin every 14 (fourteen) days. 6 mL 3   No facility-administered medications prior to visit.    Review of Systems  Review of Systems  Constitutional:  Positive for fatigue.  HENT:  Positive for voice change.   Respiratory:  Positive for cough.    Cardiovascular: Negative.    Physical Exam  BP (!) 170/88 (BP Location: Left Arm, Cuff Size: Normal)   Pulse 72   Ht 5\' 4"  (1.626 m)   Wt 127 lb 3.2 oz (57.7 kg)   LMP  (LMP Unknown)   SpO2 92%   BMI 21.83 kg/m  Physical Exam Constitutional:      General: She is not in acute distress.    Comments: Chronically ill  HENT:     Head:     Comments: Voice hoarseness while speaking Cardiovascular:     Rate and Rhythm: Normal rate and regular rhythm.  Pulmonary:     Effort: No respiratory distress.     Breath sounds: No stridor. No wheezing or rhonchi.     Comments: Frequent coughing Musculoskeletal:        General: Normal range of motion.  Skin:    General: Skin is warm and dry.  Neurological:     General: No focal deficit present.     Mental Status: She is alert and oriented to person, place, and time. Mental status is at baseline.  Psychiatric:        Mood and Affect: Mood normal.        Behavior: Behavior normal.        Thought Content: Thought content normal.        Judgment: Judgment normal.      Lab Results:  CBC    Component Value Date/Time   WBC 4.4 08/23/2023 0853   WBC 2.0 (L) 04/26/2021 0333   RBC 4.36 08/23/2023 0853   HGB 11.6 (L) 08/23/2023 0853   HGB 11.6 07/03/2019 1153   HCT 36.0 08/23/2023 0853   HCT 36.0 07/03/2019 1153   PLT 149 (L) 08/23/2023 0853   PLT 291 07/03/2019 1153   MCV 82.6 08/23/2023 0853   MCV 81 07/03/2019 1153   MCH 26.6 08/23/2023 0853   MCHC 32.2 08/23/2023 0853   RDW 13.1 08/23/2023 0853   RDW 13.2 07/03/2019 1153   LYMPHSABS 1.0 08/23/2023 0853   MONOABS 0.5 08/23/2023 0853   EOSABS 0.2 08/23/2023 0853   BASOSABS 0.0 08/23/2023 0853    BMET    Component Value Date/Time   NA 139 08/23/2023 0853   NA 140 03/04/2021 1516  K 3.8 08/23/2023 0853   CL 106 08/23/2023 0853   CO2 27 08/23/2023 0853   GLUCOSE 107 (H) 08/23/2023 0853   BUN 11 08/23/2023 0853   BUN 21 03/04/2021 1516   CREATININE 1.16 (H) 08/23/2023  0853   CALCIUM 8.8 (L) 08/23/2023 0853   GFRNONAA 50 (L) 08/23/2023 0853   GFRAA 58 (L) 06/15/2020 0853   GFRAA >60 03/15/2020 0808    BNP    Component Value Date/Time   BNP 35.4 02/07/2021 1640    ProBNP No results found for: "PROBNP"  Imaging: NM PET Image Initial (PI) Skull Base To Thigh Result Date: 08/16/2023 CLINICAL DATA:  Subsequent treatment strategy for small cell lung cancer. EXAM: NUCLEAR MEDICINE PET SKULL BASE TO THIGH TECHNIQUE: 6.5 mCi F-18 FDG was injected intravenously. Full-ring PET imaging was performed from the skull base to thigh after the radiotracer. CT data was obtained and used for attenuation correction and anatomic localization. Fasting blood glucose: 109 mg/dl COMPARISON:  86/57/8469. FINDINGS: Mediastinal blood pool activity: SUV max 3.0 Liver activity: SUV max NA NECK: No abnormal hypermetabolism. Incidental CT findings: None. CHEST: Mildly hypermetabolic mediastinal lymph nodes. Index subcarinal lymph node measures approximately 1.3 cm, SUV max 5.6, compared to 8.6 previously. Mildly hypermetabolic parenchymal consolidation and retraction in the apical and medial upper right hemithorax. SUV max 6.9 in the central right lower lobe (4/73). No additional abnormal hypermetabolism. Incidental CT findings: Right IJ Port-A-Cath terminates at the SVC RA junction or high right atrium. Atherosclerotic calcification of the aorta. Heart is at the upper limits of normal in size. Perilymphatic nodularity and septal thickening throughout the right upper, right middle and right lower lobes with a thin rind of mildly hypermetabolic pleural fluid/thickening in the right hemithorax. Trace left pleural effusion. ABDOMEN/PELVIS: No abnormal hypermetabolism. Incidental CT findings: None. SKELETON: No abnormal hypermetabolism. Incidental CT findings: Degenerative changes in the spine. IMPRESSION: 1. Post treatment parenchymal consolidation and retraction with hypermetabolism in the central  right lower lobe, worrisome for disease recurrence. Perilymphatic nodularity and thickening in the right hemithorax, most indicative of lymphangitic carcinomatosis. Associated hypermetabolic mediastinal lymph nodes. 2. Likely malignant rind of pleural fluid/thickening in the right hemithorax. 3. Trace left pleural fluid. 4.  Aortic atherosclerosis (ICD10-I70.0). Electronically Signed   By: Leanna Battles M.D.   On: 08/16/2023 14:22   DG Chest 2 View Result Date: 08/06/2023 CLINICAL DATA:  Shortness of breath with exertion EXAM: CHEST - 2 VIEW COMPARISON:  Chest radiograph 01/17/2022 FINDINGS: Stable cardiac and mediastinal contours. Port-A-Cath tip projects over the superior vena cava. Stable consolidation right upper hemithorax. Similar opacities throughout the right mid and lower lung which may represent lymphangitic spread of tumor. Small right pleural effusion. No pneumothorax. Thoracic spine degenerative changes. Clear left lung. IMPRESSION: 1. Stable consolidation right upper hemithorax. 2. Similar opacities throughout the right mid and lower lung which may represent lymphangitic spread of tumor. 3. Small right pleural effusion. Electronically Signed   By: Annia Belt M.D.   On: 08/06/2023 13:02     Assessment & Plan:   No problem-specific Assessment & Plan notes found for this encounter.  Assessment and Plan    Lung cancer recurrence former patient of Dr. [name], under the care of Dr. [name] for metastatic NSCLC. Recent PET showed disease re-occurrence and evidence of lymphangitic carcinomatosis. Dealing with chronic cough. Currently being managed with recurrent prednisone tapers and hycodan cough syrup. She does recommend improvement while on oral steroids. I did recommend she also start PPI. She is  starting Alimta March 20th (depending on genetic testing) and was prescribed decadron 4mg  BID day before, day of and day after treatment per oncology recommendations. She is established with  palliative care as well.  - Will discuss with Dr. [name] about starting patient on daily low dose prednisone 10mg  daily to help manage cough.  - Continue Tagrisso. - Await genetic mutation results. - Communicate with oncologist Dr. [name].  Fatigue and weakness Fatigue likely related to lung cancer and treatment. Improved with prednisone and Bevespi. Discussed long-term prednisone risks. - Prescribe prednisone 10 mg daily, adjust as needed, ceiling dose 20 mg for flare-ups. - Continue Bevespi inhaler.  Chronic cough Dry cough with occasional blood-tinged sputum, likely related to lung cancer and possible reflux. Discussed Hycodan and omeprazole for management. - Prescribe Hycodan cough syrup every 8 hours as needed. - Consider omeprazole 20 mg twice a day.  Gastroesophageal reflux disease (GERD) Symptoms suggestive of reflux, possibly exacerbated by prednisone. Discussed omeprazole to manage symptoms. - Prescribe omeprazole 20 mg twice a day, 30 minutes before meals.  Follow-up Scheduled to start Alimta on March 20th. Coordination with oncology team needed for treatment compatibility. - Schedule follow-up in two weeks. - Coordinate with Dr. [name] and Dr. [name] regarding treatment plan and prednisone use.        Glenford Bayley, NP 08/28/2023

## 2023-08-28 NOTE — Progress Notes (Signed)
 [facility] Spiritual Care Note  Follow Allison [name] through [facility] and reached out by phone for pastoral check-in. She reports, through coughing, that she does well if she stays still and does not talk, but that her coughing gets stirred up very readily with any speech or other activity. Allison [name] is hopeful that treatment will help her resume more activity/talking with less interference. We limited the call to five minutes and plan for a quick greeting when she is on campus for treatment on 09/06/2023.   [address] [place], [place], [facility] Pager [phone] Voicemail [phone]

## 2023-08-28 NOTE — Patient Instructions (Addendum)
 -  LUNG CANCER RECURRENCE: There is a suspected recurrence of lung cancer in your right lower lobe, as indicated by your recent PET scan. This may involve the lymphatic system. Awaiting results of genetic mutation tests. Please stay in communication with your oncologist, Dr. [name].  -FATIGUE AND WEAKNESS: Your fatigue and weakness are likely related to your lung cancer and its treatment. Prednisone and Bevespi have helped improve your breathing and energy levels. We will prescribe prednisone 10 mg daily, I will reach out to Dr. [name] about Decaron. Continue using the Bevespi inhaler as directed.  -COUGH: Your dry cough, sometimes with blood-tinged sputum, is likely related to your lung cancer and possibly acid reflux. We will prescribe Hycodan to manage your cough, to be taken every 8 hours as needed. We are also considering omeprazole to help with reflux symptoms.  -GASTROESOPHAGEAL REFLUX DISEASE (GERD): Your symptoms suggest acid reflux, which may be worsened by prednisone. We will prescribe omeprazole 20 mg to be taken twice a day, 30 minutes before meals, to help manage these symptoms.  INSTRUCTIONS: You are scheduled to start Alimta treatment on March 20th. Please coordinate with your oncology team to ensure compatibility with your current treatments. We will schedule a follow-up appointment in two weeks to monitor your progress. Continue to communicate with Dr. [name] and Dr. [name] regarding your treatment plan and prednisone use.  Follow-up: 2 weeks with either Kandice Robinsons or Waynetta Sandy NP

## 2023-08-30 ENCOUNTER — Telehealth: Payer: Self-pay | Admitting: Medical Oncology

## 2023-08-30 NOTE — Telephone Encounter (Signed)
 I was waiting to hear back from Dr. [name], ill send in once I hear back hopefully by Tomorrow

## 2023-08-30 NOTE — Telephone Encounter (Signed)
 Ok

## 2023-08-30 NOTE — Telephone Encounter (Signed)
 Dtr called asking what is next step after Allison [name]'s visit with Dr Clent Ridges on Tuesday.   Allison [name]  completed the medrol dose pak and it helped . However  Allison [name] is noticing the cough is coming back and she is afraid Allison [name] " she will tank".  Dr Clent Ridges  note :   " Will discuss with Dr. [name] about starting patient on daily low dose prednisone 10mg  daily to help manage cough.  - Continue Tagrisso. - Await genetic mutation results. - Communicate with oncologist Dr. [name]".  Next appt with [name] is 03/20.

## 2023-08-31 ENCOUNTER — Encounter (HOSPITAL_COMMUNITY): Payer: Self-pay | Admitting: Internal Medicine

## 2023-09-03 ENCOUNTER — Telehealth: Payer: Self-pay

## 2023-09-03 NOTE — Telephone Encounter (Signed)
 Spoke with patients daughter, [name] about moving up appts. Daughter states that "Dalinda had an episode of vasovagal around 130 Friday, BP 124/68." Informed daughter that BP is normal and to continue to monitor for symptoms. Spoke with patient and she states she has no energy, coughing and weak. Informed patient to continue taking cough medicine as needed and stay hydrated.   Patients appts has been moved to 09/04/2023 for labs, Cassie and infusion.  Patients daughter verbalized understanding.

## 2023-09-03 NOTE — Progress Notes (Unsigned)
 [facility] OFFICE PROGRESS NOTE  Allison Devon, MD [address] [place] [address]  DIAGNOSIS: Stage IV (T2 a,N2, M1a) non-small cell lung cancer, adenocarcinoma diagnosed in July 2019 and presented with right upper lobe lung mass in addition to mediastinal lymphadenopathy as well as bilateral pulmonary nodules and malignant right pleural effusion.   Biomarker Findings Microsatellite status - Cannot Be Determined Tumor Mutational Burden - Cannot Be Determined Genomic Findings For a complete list of the genes assayed, please refer to the Appendix. EGFR exon 19 deletion ([id number]) TP53 Y220C 7 Disease relevant genes with no reportable alterations: KRAS, ALK, BRAF, MET, RET, ERBB2, ROS1   Repeat molecular studies in 2025 showed no new mutations  PRIOR THERAPY: 1) Status post right Pleurx catheter placement by Dr. Donata Clay for drainage of malignant right pleural effusion. 2) palliative radiotherapy to the enlarging right upper lobe lung mass and mediastinum under the care of Dr. Roselind Messier expected to be completed on January 05, 2021. 3) Systemic chemotherapy with carboplatin for AUC of 5 and Alimta 500 Mg/M2 every 3 weeks.  First dose 03/17/2021.  The patient will also continue her current treatment with Tagrisso 80 mg p.o. daily.  She is status post 26 cycles. Starting from cycle #7, she will be on Alimta only 400 mg/m2.  Her treatment with Alimta has been on hold for several months now.    CURRENT THERAPY: Tagrisso 80 mg p.o. daily.  First dose was given on 01/29/2018.  Status post 63 months of treatment. Sending repeat molecular studies today on 08/23/23 and tentatively arranging IV chemotherapy with alimta 400 mg/m2 IV every 3 weeks to start around 09/11/23   INTERVAL HISTORY: Allison [name] 73 y.o. female returns to the clinic today for follow-up visit accompanied by her daughter and boyfriend.  The patient was last seen by Dr. [name] and myself on  08/23/2023.  Unfortunately the patient was found to have disease progression recently.  Therefore, at her last appointment we discussed the options of systemic treatment.  The patient needs to start systemic treatment.  She was given the option of carboplatin and Alimta versus single agent Alimta.  She will continue her Tagrisso for neuroprotection.  She likely would be a poor candidate for lazertinib and amivantamab due to her performance status, although this would be an effective option.  The patient opted to proceed with single agent Alimta. When she was on alimta before, she was on 400 mg/m2 secondary to labs. We did repeat her molecular studies to make sure she did not develop any resistant mutations that are actionable.  Her molecular study do now show any new actionable mutations besides the known EGFR mutation.  Since last being seen, the patient pulmonary medicine and they are considering persistent dose of prednisone. She typically feels better when she is on steroids with regard to her energy and appetite.  She has been losing weight.  She also called reporting vasovagal episode on Friday 08/31/23.  They called EMS.  Her EKG did not show any prolonged QT.  She has been having normal BPs.   She has been experiencing a persistent cough and progressive weakness since January. The cough persists despite the use of hycodan cough syrup, which was recently renewed, and a previous course of antibiotics and prednisone.  Does feel like her cough is a little bit looser.  She experiences increased shortness of breath, becoming winded with minimal exertion, such as walking from her one room to the other.  She also has had diarrhea recently and she is wondering if she can have IVF today. Her labs do show she is dehydrated. She has been taking stool softener recently which may have precipitated the diarrhea, although tagrisso can cause diarrhea as well.   She denies any fever, or changes in frequency of her  night sweats.  Regarding her history of appetite stimulants, she was on Megace in the past but has a history of a stroke in 2022.  She tried Marinol in the past but it made her feel drowsy.  She cannot take Remeron due to drug interactions with Tagrisso.   She reports headaches but no vision changes. A brain scan conducted in January was normal.   Her current medications include Tagrisso, hydrocodone syrup for cough, and a new Bevespi inhaler for shortness of breath. She also takes folic acid regularly.   MEDICAL HISTORY: Past Medical History:  Diagnosis Date   Anemia    Anxiety    Arthritis    Asthma    exercise induced   Coronary artery disease    Depression    PMH   Dyspnea    GERD (gastroesophageal reflux disease)    Glaucoma    History of radiation therapy 01/05/2021   IMRT right lung  11/24/2020-01/05/2021  Dr Antony Blackbird   Hypertension    lung ca dx'd 11/2017   right   Malignant pleural effusion    right   Nuclear sclerotic cataract of right eye 02/28/2021   Dr. Sallye Lat, cataract surgery February 2023   PONV (postoperative nausea and vomiting)    Pre-diabetes    Raynaud's disease    Raynaud's disease    Stroke (HCC) 01/2021   balance off, some express aphasia, weakness    ALLERGIES:  is allergic to hydrocodone-acetaminophen, penicillins, lactose, vicodin [hydrocodone-acetaminophen], bacid, and other.  MEDICATIONS:  Current Outpatient Medications  Medication Sig Dispense Refill   acetaminophen (TYLENOL) 500 MG tablet Take 1,000 mg by mouth every 6 (six) hours as needed (pain).     ALPRAZolam (XANAX) 0.25 MG tablet Take 1 tablet (0.25 mg total) by mouth 2 (two) times daily as needed for anxiety. May take 2 tablets (0.5 mg total) at bedtime. 60 tablet 0   amLODipine (NORVASC) 5 MG tablet Take 1 tablet (5 mg total) by mouth daily. 90 tablet 3   benzonatate (TESSALON) 100 MG capsule Take 2 capsules (200 mg total) by mouth 3 (three) times daily as needed for  cough. 60 capsule 2   carvedilol (COREG) 12.5 MG tablet Take 1 tablet (12.5 mg total) by mouth 2 (two) times daily with a meal. 180 tablet 3   CHELATED MAGNESIUM PO Take 200 mg by mouth daily.     Cholecalciferol (VITAMIN D) 125 MCG (5000 UT) CAPS Take 1 Capful by mouth daily.     dexamethasone (DECADRON) 4 MG tablet Take 1 tablet twice a day the day before, day of, and day after treatment 40 tablet 2   docusate sodium (COLACE) 100 MG capsule Take 1 capsule (100 mg total) by mouth 2 (two) times daily. 60 capsule 5   dorzolamide-timolol (COSOPT) 22.3-6.8 MG/ML ophthalmic solution Place 1 drop into both eyes 2 (two) times daily.     doxycycline (VIBRAMYCIN) 100 MG capsule Take 1 capsule (100 mg total) by mouth 2 (two) times daily for 7 days. 14 capsule 0   enoxaparin (LOVENOX) 80 MG/0.8ML injection Inject 80 mg into the skin daily.     ezetimibe (ZETIA) 10 MG tablet  Take 10 mg by mouth daily.     FeFum-FePoly-FA-B Cmp-C-Biot (FOLIVANE-PLUS) CAPS Take 1 capsule by mouth in the morning. 90 capsule 0   FLUoxetine (PROZAC) 20 MG capsule Take 1 capsule (20 mg total) by mouth daily. 90 capsule 3   folic acid (FOLVITE) 1 MG tablet TAKE 1 TABLET BY MOUTH EVERY DAY 90 tablet 1   Glycopyrrolate-Formoterol (BEVESPI AEROSPHERE) 9-4.8 MCG/ACT AERO Inhale 2 puffs into the lungs 2 (two) times daily. 10.7 g 3   HYDROcodone bit-homatropine (HYCODAN) 5-1.5 MG/5ML syrup Take 5 mLs by mouth every 6 (six) hours as needed for cough. 240 mL 0   hyoscyamine (LEVSIN SL) 0.125 MG SL tablet Place 1-2 tablets (0.125-0.25mg ) under the tongue every 6 (six) hours as needed for cramping. 30 tablet 3   ipratropium-albuterol (DUONEB) 0.5-2.5 (3) MG/3ML SOLN Take 3 mLs by nebulization every 6 (six) hours as needed (Asthma). 360 mL 3   isosorbide mononitrate (IMDUR) 30 MG 24 hr tablet Take 0.5 tablets (15 mg total) by mouth daily. 90 tablet 3   latanoprost (XALATAN) 0.005 % ophthalmic solution Place 1 drop into both eyes at bedtime.       levofloxacin (LEVAQUIN) 750 MG tablet Take 1 tablet (750 mg total) by mouth every other day. 4 tablet 0   methylPREDNISolone (MEDROL DOSEPAK) 4 MG TBPK tablet Take as directed. 21 tablet 0   nystatin (MYCOSTATIN) 100000 UNIT/ML suspension Take 5 mLs (500,000 Units total) by mouth 4 (four) times daily. 60 mL 0   ondansetron (ZOFRAN-ODT) 4 MG disintegrating tablet Take 1 tablet (4 mg total) by mouth every 8 (eight) hours as needed for nausea or vomiting. 30 tablet 1   osimertinib mesylate (TAGRISSO) 80 MG tablet Take 1 tablet (80 mg total) by mouth daily. 30 tablet 4   prochlorperazine (COMPAZINE) 10 MG tablet Take 1 tablet (10 mg total) by mouth every 6 (six) hours as needed. 30 tablet 2   prochlorperazine (COMPAZINE) 10 MG tablet Take 1 tablet (10 mg total) by mouth every 6 (six) hours as needed. 30 tablet 2   REPATHA SURECLICK 140 MG/ML SOAJ Inject 140 mg into the skin every 14 (fourteen) days. 6 mL 3   No current facility-administered medications for this visit.    SURGICAL HISTORY:  Past Surgical History:  Procedure Laterality Date   ABDOMINAL HYSTERECTOMY     partial   BRONCHIAL BIOPSY  04/21/2021   Procedure: BRONCHIAL BIOPSIES;  Surgeon: Josephine Igo, DO;  Location: [facility];  Service: Pulmonary;;   BRONCHIAL BRUSHINGS  04/21/2021   Procedure: BRONCHIAL BRUSHINGS;  Surgeon: Josephine Igo, DO;  Location: [facility];  Service: Pulmonary;;   BRONCHIAL NEEDLE ASPIRATION BIOPSY  04/21/2021   Procedure: BRONCHIAL NEEDLE ASPIRATION BIOPSIES;  Surgeon: Josephine Igo, DO;  Location: [facility];  Service: Pulmonary;;   CHEST TUBE INSERTION Right 01/01/2018   Procedure: INSERTION PLEURAL DRAINAGE CATHETER;  Surgeon: Kerin Perna, MD;  Location[facility];  Service: Thoracic;  Laterality: Right;   CHEST TUBE INSERTION  04/21/2021   Procedure: CHEST TUBE INSERTION;  Surgeon: Josephine Igo, DO;  Location: [facility]OS[facility];  Service: Pulmonary;;   COLONOSCOPY     CORONARY STENT  INTERVENTION N/A 06/16/2019   Procedure: CORONARY STENT INTERVENTION;  Surgeon: Corky Crafts, MD;  Location: [facility] INVASIVE CV LAB;  Service: Cardiovascular;  Laterality: N/A;   DILATION AND CURETTAGE OF UTERUS     EYE SURGERY     due to Glaucoma   IR IMAGING GUIDED PORT  INSERTION  03/22/2021   IR PORT REPAIR CENTRAL VENOUS ACCESS DEVICE  04/15/2021   LEFT HEART CATH AND CORONARY ANGIOGRAPHY N/A 06/16/2019   Procedure: LEFT HEART CATH AND CORONARY ANGIOGRAPHY;  Surgeon: Corky Crafts, MD;  Location: [facility] INVASIVE CV LAB;  Service: Cardiovascular;  Laterality: N/A;   REMOVAL OF PLEURAL DRAINAGE CATHETER Right 11/07/2018   Procedure: REMOVAL OF PLEURAL DRAINAGE CATHETER;  Surgeon: Kerin Perna, MD;  Location: [facility] OR;  Service: Thoracic;  Laterality: Right;   ROTATOR CUFF REPAIR     TUBAL LIGATION     VIDEO BRONCHOSCOPY WITH ENDOBRONCHIAL NAVIGATION Bilateral 04/21/2021   Procedure: VIDEO BRONCHOSCOPY WITH ENDOBRONCHIAL NAVIGATION;  Surgeon: Josephine Igo, DO;  Location: [facility];  Service: Pulmonary;  Laterality: Bilateral;  ION   WISDOM TOOTH EXTRACTION      REVIEW OF SYSTEMS:   Constitutional: Positive for fatigue, weight loss, and poor appetite. Negative for chills and fever.  HENT: Positive for taste alterations. Negative for mouth sores, nosebleeds, sore throat and trouble swallowing.   Eyes: Negative for eye problems and icterus.  Respiratory: Positive for cough and shortness of breath. Negative for hemoptysis and wheezing.   Cardiovascular: Negative for chest pain and leg swelling.  Gastrointestinal: Positive for diarrhea. Negative for abdominal pain, constipation,  nausea and vomiting.  Genitourinary: Negative for bladder incontinence, difficulty urinating, dysuria, frequency and hematuria.   Musculoskeletal: Negative for back pain, gait problem, neck pain and neck stiffness.  Skin: Negative for itching and rash.  Neurological: Negative for dizziness, extremity  weakness, gait problem, headaches, light-headedness and seizures.  Hematological: Negative for adenopathy. Does not bruise/bleed easily.  Psychiatric/Behavioral: Negative for confusion, depression and sleep disturbance. The patient is not nervous/anxious.     PHYSICAL EXAMINATION:  There were no vitals taken for this visit.  ECOG PERFORMANCE STATUS: 2  Physical Exam  Constitutional: Oriented to person, place, and time and thin appearing female and in no distress.   HENT:  Head: Normocephalic and atraumatic.  Mouth/Throat: Oropharynx is clear and moist. No oropharyngeal exudate.  Eyes: Conjunctivae are normal. Right eye exhibits no discharge. Left eye exhibits no discharge. No scleral icterus.  Neck: Normal range of motion. Neck supple.  Cardiovascular: Normal rate, regular rhythm, normal heart sounds and intact distal pulses.   Pulmonary/Chest: Effort normal. Quiet breath sounds in right lung. No respiratory distress. No wheezes. No rales.  Abdominal: Soft. Bowel sounds are normal. Exhibits no distension and no mass. There is no tenderness.  Musculoskeletal: Normal range of motion. Exhibits no edema.  Lymphadenopathy:    No cervical adenopathy.  Neurological: Alert and oriented to person, place, and time. Exhibits muscle relaxer. Gait normal. Coordination normal.  Skin: Skin is warm and dry. No rash noted. Not diaphoretic. No erythema. No pallor.  Psychiatric: Mood, memory and judgment normal.  Vitals reviewed.  LABORATORY DATA: Lab Results  Component Value Date   WBC 4.4 08/23/2023   HGB 11.6 (L) 08/23/2023   HCT 36.0 08/23/2023   MCV 82.6 08/23/2023   PLT 149 (L) 08/23/2023      Chemistry      Component Value Date/Time   NA 139 08/23/2023 0853   NA 140 03/04/2021 1516   K 3.8 08/23/2023 0853   CL 106 08/23/2023 0853   CO2 27 08/23/2023 0853   BUN 11 08/23/2023 0853   BUN 21 03/04/2021 1516   CREATININE 1.16 (H) 08/23/2023 0853      Component Value Date/Time    CALCIUM 8.8 (L) 08/23/2023 3474  ALKPHOS 65 08/23/2023 0853   AST 19 08/23/2023 0853   ALT 9 08/23/2023 0853   BILITOT 0.4 08/23/2023 0853       RADIOGRAPHIC STUDIES:  NM PET Image Initial (PI) Skull Base To Thigh Result Date: 08/16/2023 CLINICAL DATA:  Subsequent treatment strategy for small cell lung cancer. EXAM: NUCLEAR MEDICINE PET SKULL BASE TO THIGH TECHNIQUE: 6.5 mCi F-18 FDG was injected intravenously. Full-ring PET imaging was performed from the skull base to thigh after the radiotracer. CT data was obtained and used for attenuation correction and anatomic localization. Fasting blood glucose: 109 mg/dl COMPARISON:  40/98/1191. FINDINGS: Mediastinal blood pool activity: SUV max 3.0 Liver activity: SUV max NA NECK: No abnormal hypermetabolism. Incidental CT findings: None. CHEST: Mildly hypermetabolic mediastinal lymph nodes. Index subcarinal lymph node measures approximately 1.3 cm, SUV max 5.6, compared to 8.6 previously. Mildly hypermetabolic parenchymal consolidation and retraction in the apical and medial upper right hemithorax. SUV max 6.9 in the central right lower lobe (4/73). No additional abnormal hypermetabolism. Incidental CT findings: Right IJ Port-A-Cath terminates at the SVC RA junction or high right atrium. Atherosclerotic calcification of the aorta. Heart is at the upper limits of normal in size. Perilymphatic nodularity and septal thickening throughout the right upper, right middle and right lower lobes with a thin rind of mildly hypermetabolic pleural fluid/thickening in the right hemithorax. Trace left pleural effusion. ABDOMEN/PELVIS: No abnormal hypermetabolism. Incidental CT findings: None. SKELETON: No abnormal hypermetabolism. Incidental CT findings: Degenerative changes in the spine. IMPRESSION: 1. Post treatment parenchymal consolidation and retraction with hypermetabolism in the central right lower lobe, worrisome for disease recurrence. Perilymphatic nodularity and  thickening in the right hemithorax, most indicative of lymphangitic carcinomatosis. Associated hypermetabolic mediastinal lymph nodes. 2. Likely malignant rind of pleural fluid/thickening in the right hemithorax. 3. Trace left pleural fluid. 4.  Aortic atherosclerosis (ICD10-I70.0). Electronically Signed   By: Leanna Battles M.D.   On: 08/16/2023 14:22   DG Chest 2 View Result Date: 08/06/2023 CLINICAL DATA:  Shortness of breath with exertion EXAM: CHEST - 2 VIEW COMPARISON:  Chest radiograph 01/17/2022 FINDINGS: Stable cardiac and mediastinal contours. Port-A-Cath tip projects over the superior vena cava. Stable consolidation right upper hemithorax. Similar opacities throughout the right mid and lower lung which may represent lymphangitic spread of tumor. Small right pleural effusion. No pneumothorax. Thoracic spine degenerative changes. Clear left lung. IMPRESSION: 1. Stable consolidation right upper hemithorax. 2. Similar opacities throughout the right mid and lower lung which may represent lymphangitic spread of tumor. 3. Small right pleural effusion. Electronically Signed   By: Annia Belt M.D.   On: 08/06/2023 13:02     ASSESSMENT/PLAN:  This is a very pleasant 73 year old never smoker African-American female diagnosed with stage IV non-small cell lung cancer, adenocarcinoma.  She was positive for an EGFR mutation with deletion in exon 19.  She was diagnosed in July 2019 and presented with right upper lobe lung mass in addition to mediastinal lymphadenopathy as well as bilateral pulmonary nodules and malignant right pleural effusion.   The patient was started on treatment with Tagrisso 80 mg p.o. daily status post 39 months of treatment. Started on 01/29/2018.   In April 2022, she showed evidence of disease progression with interval progression of the right apical lung mass in addition to progression of mediastinal lymphadenopathy concerning for worsening of her disease. She had repeat Guardant 360  molecular studies which did not show evidence of new resistant mutations.  Therefore, she was referred to radiation oncology and  completed palliative radiotherapy to the enlarging right upper lobe lung mass and mediastinum under the care of Dr. Roselind Messier.  This was completed in July 2022.    The patient underwent a repeat bronchoscopy and biopsy for repeat molecular testing.  The sample from the left and right lung biopsy was negative for malignancy. This was performed in November 2022.    Unfortunately, the patient  was found to have evidence of disease progression.  Therefore, Dr. [name] started the patient on systemic chemotherapy with carboplatin for AUC of 5 and Alimta 500 mg per metered squared.  She is status post 23 cycles and she tolerated it fairly well despite her ongoing issues with fatigue, chest tightness, cough, and weight loss.  Alimta was reduced to 400 mg per metered squared due to renal insufficiency.  She is not a good candidate for Avastin due to her recent CVA in August 2022.  She also is continuing to take Tagrisso as it is protective against progressive metastatic disease to the brain. Starting from cycle #7,  she started maintenance single agent alimta. Her treatment has been on hold since March 2024 due to side effects of treatment.      Of note, the patient saw Dr. Eber Hong from [facility] for second opinion in November 2022.  They discussed if she progresses on chemotherapy that there may be upcoming options. He discussed other treatment options which may be available in 2023 including patritumab deruxtcan and lazertinib/amivantmab. If these are not available, he wrote that they can determine if they can get the agents through an expanded access, single patient IND, or clinical trial if she has progression with chemotherapy.    Her most recent restaging CT scan showed disease progression with new and increasing septal thickening and perilymphatic nodularity throughout the  right hemithorax suspicious for lymphangitic spread of tumor.  A PET scan to confirm this.  Therefore, at her last appointment on 08/23/2023 we discussed the options.  She had repeat molecular studies performed which did not show any new actionable mutations.  Therefore the plan will be to proceed with single agent Alimta IV every 3 weeks as scheduled.  The alternative options that we discussed was adding carboplatin to the Alimta.  We also talked about amivantamab and lazertinib, but the patient and Dr. [name] have concerns about quality of life and side effect profile, although this likely would be effective.   The patient was seen with Dr. [name] today.  We rediscussed these options today.  Dr. [name] also discussed the possibility of a repeat biopsy which we will hold off on at this time but can consider it in the future.  Due to her recent diarrhea and dehydration, her creatinine is elevated today at 1.65.  She cannot receive Alimta today with her renal insufficiency.  Instead we will arrange for IV fluids today.  She was also instructed to hydrate well at home.  I did instruct her to purchase 1 of those large water bottles with the times on it to ensure she is getting adequate fluid throughout the day.  We talked about strategies to stay hydrated and other electrolyte drinks.  At the end of the appointment the patient and her daughter would feel more comfortable if we tentatively arrange for IV fluids later this week just to ensure that her kidney function improves prior to treatment next week to prevent any further dose delays.  Will see her back for labs and follow-up visit next week and discuss proceeding with  treatment with Alimta.  We would start her Alimta dose at 400 mg/m.  After discussion of risks and benefits of long-term steroid use, the patient will be started on a low-dose of prednisone, 10 mg daily.  Since she is on Tagrisso, we do not want her to take a PPI.  Therefore she was  instructed to discontinue her omeprazole.  The preferred with her Edgar Frisk would be Pepcid.  She will continue taking her folic acid.  She will be continuing Tagrisso for neuroprotection  She will continue taking Hycodan for cough  She was advised to stop taking her Colace as this may be precipitating her diarrhea.  Dr. [name] also discussed that is okay to take Imodium to help with the diarrhea and prevent further dehydration and weight loss.  Also discussed the brat diet.   She is here today for evaluation and repeat blood work before undergoing cycle #1 of Alimta.   The patient was advised to call immediately if she has any concerning symptoms in the interval. The patient voices understanding of current disease status and treatment options and is in agreement with the current care plan. All questions were answered. The patient knows to call the clinic with any problems, questions or concerns. We can certainly see the patient much sooner if necessary   No orders of the defined types were placed in this encounter.    Allison Ellwood L Irvin Lizama, PA-C 09/03/23  ADDENDUM: Hematology/Oncology Attending:  I had a face-to-face encounter with the patient today.  I reviewed her records, lab, previous imaging studies and recommended her care plan.  The patient was accompanied by her boyfriend as well as one of her daughter and the other daughter was available by phone during the visit.  She is a very pleasant 73 years old African-American female with stage IV non-small cell lung cancer, adenocarcinoma diagnosed in July 2019 with positive EGFR mutation with deletion in exon 19 in addition to communication with TP53 Y220C.  The patient started treatment with Tagrisso 80 mg p.o. daily since August 2019 status post 67 months of treatment.  She had evidence for disease progression in September 2022 and we added systemic chemotherapy with carboplatin and Alimta for 6 cycles followed by maintenance treatment  with Alimta in addition to her current treatment with Tagrisso.  She also received palliative radiotherapy to enlarging right upper lobe lung mass and mediastinum in July 2022. Prior treatment with Alimta has been on hold for more than a year but recent imaging studies including CT scan of the chest abdomen pelvis as well as PET scan showed suspicious disease progression with lymphangitic carcinomatosis but this could be also inflammatory process and scarring after her previous radiation. We did send blood test for molecular studies by [facility] and the final report was consistent with the same mutation that were originally seen including EGFR mutation in exon 19 and TP53 Y220C.  I had a lengthy discussion with the patient and her family about her current condition and treatment options we discussed several options for management of her condition including adding Alimta again to her current treatment with Tagrisso versus switching her treatment to a combination of Amivantamab and Lazertinib.  We also discussed the option of palliative care.  The patient is concerned about the toxicity of the combination of Amivantamab and Lazertinib. She is in agreement with proceeding with her current treatment with Tagrisso in addition to Alimta 400 Mg/M2 but will start the first dose of this treatment next week because of  her renal insufficiency today. For the renal insufficiency this is likely secondary to dehydration and recent diarrhea.  We will arrange for the patient to receive 1 L of normal saline in the clinic today and we encouraged her to increase her oral intake of fluids. 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 [name] for consideration of repeat bronchoscopy and to rule out the remote possibility of a small cell lung cancer versus inflammatory process versus progressive non-small cell lung cancer. The patient will  come back for follow-up visit next week for evaluation before starting her first dose of Alimta on September 11, 2023. For the fatigue and weakness as well as lack of appetite we will start the patient on prednisone 10 mg p.o. on daily basis. The patient and her family are in agreement with the current plan. The patient was advised to call immediately if she has any other concerning symptoms in the interval. The total time spent in the appointment was 55 minutes. Disclaimer: This note was dictated with voice recognition software. Similar sounding words can inadvertently be transcribed and may be missed upon review. [name], MD

## 2023-09-04 ENCOUNTER — Inpatient Hospital Stay

## 2023-09-04 ENCOUNTER — Other Ambulatory Visit (HOSPITAL_COMMUNITY): Payer: Self-pay

## 2023-09-04 ENCOUNTER — Encounter: Payer: Self-pay | Admitting: General Practice

## 2023-09-04 ENCOUNTER — Inpatient Hospital Stay: Admitting: Physician Assistant

## 2023-09-04 VITALS — BP 162/70 | HR 109

## 2023-09-04 VITALS — BP 160/89 | HR 91 | Temp 97.8°F | Resp 16 | Wt 124.9 lb

## 2023-09-04 DIAGNOSIS — Z95828 Presence of other vascular implants and grafts: Secondary | ICD-10-CM

## 2023-09-04 DIAGNOSIS — C3491 Malignant neoplasm of unspecified part of right bronchus or lung: Secondary | ICD-10-CM

## 2023-09-04 DIAGNOSIS — I82623 Acute embolism and thrombosis of deep veins of upper extremity, bilateral: Secondary | ICD-10-CM | POA: Diagnosis not present

## 2023-09-04 DIAGNOSIS — I82C13 Acute embolism and thrombosis of internal jugular vein, bilateral: Secondary | ICD-10-CM | POA: Diagnosis not present

## 2023-09-04 LAB — CBC WITH DIFFERENTIAL (CANCER CENTER ONLY)
Abs Immature Granulocytes: 0.06 10*3/uL (ref 0.00–0.07)
Basophils Absolute: 0 10*3/uL (ref 0.0–0.1)
Basophils Relative: 0 %
Eosinophils Absolute: 0 10*3/uL (ref 0.0–0.5)
Eosinophils Relative: 0 %
HCT: 38.5 % (ref 36.0–46.0)
Hemoglobin: 12.5 g/dL (ref 12.0–15.0)
Immature Granulocytes: 1 %
Lymphocytes Relative: 10 %
Lymphs Abs: 1.1 10*3/uL (ref 0.7–4.0)
MCH: 26.2 pg (ref 26.0–34.0)
MCHC: 32.5 g/dL (ref 30.0–36.0)
MCV: 80.7 fL (ref 80.0–100.0)
Monocytes Absolute: 0.5 10*3/uL (ref 0.1–1.0)
Monocytes Relative: 4 %
Neutro Abs: 10.2 10*3/uL — ABNORMAL HIGH (ref 1.7–7.7)
Neutrophils Relative %: 85 %
Platelet Count: 154 10*3/uL (ref 150–400)
RBC: 4.77 MIL/uL (ref 3.87–5.11)
RDW: 13.5 % (ref 11.5–15.5)
WBC Count: 11.9 10*3/uL — ABNORMAL HIGH (ref 4.0–10.5)
nRBC: 0 % (ref 0.0–0.2)

## 2023-09-04 LAB — CMP (CANCER CENTER ONLY)
ALT: 14 U/L (ref 0–44)
AST: 22 U/L (ref 15–41)
Albumin: 3.6 g/dL (ref 3.5–5.0)
Alkaline Phosphatase: 72 U/L (ref 38–126)
Anion gap: 9 (ref 5–15)
BUN: 28 mg/dL — ABNORMAL HIGH (ref 8–23)
CO2: 24 mmol/L (ref 22–32)
Calcium: 9.4 mg/dL (ref 8.9–10.3)
Chloride: 100 mmol/L (ref 98–111)
Creatinine: 1.65 mg/dL — ABNORMAL HIGH (ref 0.44–1.00)
GFR, Estimated: 33 mL/min — ABNORMAL LOW (ref >60)
Glucose, Bld: 141 mg/dL — ABNORMAL HIGH (ref 70–99)
Potassium: 4.1 mmol/L (ref 3.5–5.1)
Sodium: 133 mmol/L — ABNORMAL LOW (ref 135–145)
Total Bilirubin: 0.3 mg/dL (ref 0.0–1.2)
Total Protein: 6.8 g/dL (ref 6.5–8.1)

## 2023-09-04 MED ORDER — HEPARIN SOD (PORK) LOCK FLUSH 100 UNIT/ML IV SOLN
500.0000 [IU] | Freq: Once | INTRAVENOUS | Status: AC
  Administered 2023-09-04: 500 [IU] via INTRAVENOUS

## 2023-09-04 MED ORDER — SODIUM CHLORIDE 0.9% FLUSH
10.0000 mL | Freq: Once | INTRAVENOUS | Status: AC
  Administered 2023-09-04: 10 mL

## 2023-09-04 MED ORDER — SODIUM CHLORIDE 0.9 % IV SOLN: INTRAVENOUS | Status: DC

## 2023-09-04 MED ORDER — PREDNISONE 10 MG PO TABS
10.0000 mg | ORAL_TABLET | Freq: Every day | ORAL | 2 refills | Status: DC
  Filled 2023-09-04: qty 30, 30d supply, fill #0
  Filled 2023-10-04: qty 30, 30d supply, fill #1
  Filled 2023-11-13: qty 30, 30d supply, fill #2

## 2023-09-04 NOTE — Patient Instructions (Signed)
 Dehydration, Adult Dehydration is a condition in which there is not enough water or other fluids in the body. This happens when a person loses more fluids than they take in. Important organs cannot work right without the right amount of fluids. Any loss of fluids from the body can cause dehydration. Dehydration can be mild, worse, or very bad. It should be treated right away to keep it from getting very bad. What are the causes? Conditions that cause loss of water in the body. They include: Watery poop (diarrhea). Vomiting. Sweating a lot. Fever. Infection. Peeing (urinating) a lot. Not drinking enough fluids. Certain medicines, such as medicines that take extra fluid out of the body (diuretics). Lack of safe drinking water. Not being able to get enough water and food. What increases the risk? Having a long-term (chronic) illness that has not been treated the right way, such as: Diabetes. Heart disease. Kidney disease. Being 25 years of age or older. Having a disability. Living in a place that is high above the ground or sea (high in altitude). The thinner, drier air causes more fluid loss. Doing exercises that put stress on your body for a long time. Being active when in hot places. What are the signs or symptoms? Symptoms of dehydration depend on how bad it is. Mild or worse dehydration Thirst. Dry lips or dry mouth. Feeling dizzy or light-headed. Muscle cramps. Passing little pee or dark pee. Pee may be the color of tea. Headache. Very bad dehydration Changes in skin. Skin may: Be cold to the touch (clammy). Be blotchy or pale. Not go back to normal right after you pinch it and let it go. Little or no tears, pee, or sweat. Fast breathing. Low blood pressure. Weak pulse. Pulse that is more than 100 beats a minute when you are sitting still. Other changes, such as: Feeling very thirsty. Eyes that look hollow (sunken). Cold hands and feet. Being confused. Being very  tired (lethargic) or having trouble waking from sleep. Losing weight. Loss of consciousness. How is this treated? Treatment for this condition depends on how bad your dehydration is. Treatment should start right away. Do not wait until your condition gets very bad. Very bad dehydration is an emergency. You will need to go to a hospital. Mild or worse dehydration can be treated at home. You may be asked to: Drink more fluids. Drink an oral rehydration solution (ORS). This drink gives you the right amount of fluids, salts, and minerals (electrolytes). Very bad dehydration can be treated: With fluids through an IV tube. By correcting low levels of electrolytes in the body. By treating the problem that caused your dehydration. Follow these instructions at home: Oral rehydration solution If told by your doctor, drink an ORS: Make an ORS. Use instructions on the package. Start by drinking small amounts, about  cup (120 mL) every 5-10 minutes. Slowly drink more until you have had the amount that your doctor said to have.  Eating and drinking  Drink enough clear fluid to keep your pee pale yellow. If you were told to drink an ORS, finish the ORS first. Then, start slowly drinking other clear fluids. Drink fluids such as: Water. Do not drink only water. Doing that can make the salt (sodium) level in your body get too low. Water from ice chips you suck on. Fruit juice that you have added water to (diluted). Low-calorie sports drinks. Eat foods that have the right amounts of salts and minerals, such as bananas, oranges, potatoes,  tomatoes, or spinach. Do not drink alcohol. Avoid drinks that have caffeine or sugar. These include:: High-calorie sports drinks. Fruit juice that you did not add water to. Soda. Coffee or energy drinks. Avoid foods that are greasy or have a lot of fat or sugar. General instructions Take over-the-counter and prescription medicines only as told by your doctor. Do  not take sodium tablets. Doing that can make the salt level in your body get too high. Return to your normal activities as told by your doctor. Ask your doctor what activities are safe for you. Keep all follow-up visits. Your doctor may check and change your treatment. Contact a doctor if: You have pain in your belly (abdomen) and the pain: Gets worse. Stays in one place. You have a rash. You have a stiff neck. You get angry or annoyed more easily than normal. You are more tired or have a harder time waking than normal. You feel weak or dizzy. You feel very thirsty. Get help right away if: You have any symptoms of very bad dehydration. You vomit every time you eat or drink. Your vomiting gets worse, does not go away, or you vomit blood or green stuff. You are getting treatment, but symptoms are getting worse. You have a fever. You have a very bad headache. You have: Diarrhea that gets worse or does not go away. Blood in your poop (stool). This may cause poop to look black and tarry. No pee in 6-8 hours. Only a small amount of pee in 6-8 hours, and the pee is very dark. You have trouble breathing. These symptoms may be an emergency. Get help right away. Call 911. Do not wait to see if the symptoms will go away. Do not drive yourself to the hospital. This information is not intended to replace advice given to you by your health care provider. Make sure you discuss any questions you have with your health care provider. Document Revised: 01/02/2022 Document Reviewed: 01/02/2022 Elsevier Patient Education  2024 [employer].

## 2023-09-04 NOTE — Progress Notes (Signed)
 [facility] Spiritual Care Note  Allison [name] phoned today to notify chaplain of schedule change; visited briefly in exam room following her appointment with Allison [name]/PA, meeting husband Allison [name] and daughter Allison [name]. Allison [name] was elated for connection and hugs, and pleasantly surprised by gift of handmade prayer shawl and goodie bag of self-care items.  Allison [name] plans to go home and rest, hoping to log onto [facility] call later this afternoon, so we may exchange more updates then. We also plan to follow up at a future treatment.   [address] [place], [place], [facility] Pager [phone] Voicemail [phone]

## 2023-09-05 ENCOUNTER — Other Ambulatory Visit: Payer: Self-pay | Admitting: Internal Medicine

## 2023-09-05 DIAGNOSIS — C3491 Malignant neoplasm of unspecified part of right bronchus or lung: Secondary | ICD-10-CM

## 2023-09-06 ENCOUNTER — Inpatient Hospital Stay

## 2023-09-06 ENCOUNTER — Inpatient Hospital Stay: Admitting: Physician Assistant

## 2023-09-06 NOTE — Progress Notes (Signed)
 [facility] OFFICE PROGRESS NOTE  Allison Devon, MD [address] [place] [address]  DIAGNOSIS: Stage IV (T2 a,N2, M1a) non-small cell lung cancer, adenocarcinoma diagnosed in July 2019 and presented with right upper lobe lung mass in addition to mediastinal lymphadenopathy as well as bilateral pulmonary nodules and malignant right pleural effusion.   Biomarker Findings Microsatellite status - Cannot Be Determined Tumor Mutational Burden - Cannot Be Determined Genomic Findings For a complete list of the genes assayed, please refer to the Appendix. EGFR exon 19 deletion ([id number]) TP53 Y220C 7 Disease relevant genes with no reportable alterations: KRAS, ALK, BRAF, MET, RET, ERBB2, ROS1    Repeat molecular studies in 2025 showed no new mutations  PRIOR THERAPY: 1) Status post right Pleurx catheter placement by Dr. Donata Clay for drainage of malignant right pleural effusion. 2) palliative radiotherapy to the enlarging right upper lobe lung mass and mediastinum under the care of Dr. Roselind Messier expected to be completed on January 05, 2021. 3) Systemic chemotherapy with carboplatin for AUC of 5 and Alimta 500 Mg/M2 every 3 weeks.  First dose 03/17/2021.  The patient will also continue her current treatment with Tagrisso 80 mg p.o. daily.  She is status post 26 cycles. Starting from cycle #7, she will be on Alimta only 400 mg/m2.  Her treatment with Alimta has been on hold for several months now.      CURRENT THERAPY:  Tagrisso 80 mg p.o. daily.  First dose was given on 01/29/2018.  Status post 63 months of treatment.  IV chemotherapy with alimta 400 mg/m2 IV every 3 weeks to start around 09/11/23   INTERVAL HISTORY: Allison [name] 73 y.o. female returns to the clinic today for a follow-up visit accompanied by her boyfriend and daughter.  In summary, the patient was found to have disease progression recently.  She was scheduled to start systemic chemotherapy with  Alimta last week but she was dehydrated from her recent episode of diarrhea.  Therefore, due to her renal insufficiency, we could not proceed with Alimta as scheduled that day.  Therefore, she was receiving IV fluids last week and hydrating well at home.  She is here today to reconsider resuming treatment.  In summary,She has been experiencing a persistent cough and progressive weakness since January. The cough persists despite the use of hycodan cough syrup, which was recently renewed, and a previous course of antibiotics and prednisone.  Was started on a low-dose of prednisone last week (10 mg)  *has**cough and appetite.  In the interval since last being seen she presented to the emergency room a few days later for bilateral upper extremity swelling.  The patient had been on Lovenox due to history of stroke but had not been taking this consistently.  Therefore she was found to have acute DVT in the right subclavian vein, right axillary vein, and brachial veins.  He also had left DVT in the left internal jugular vein, left subclavian, and brachial veins.  Her Lovenox dose was increased to twice daily.  She is wondering who was going to refill this when she runs out.  I let her know we be happy to fill this moving forward and this is going to be a continuous and lifelong medication.  She continues to have the right sided rib discomfort which is stable to slightly improved with her pain regimen.  In the hospital she was given fentanyl which did help her pain.  She is scheduled to see palliative  care today to talk about pain management.  She also had a repeat CT scan in the hospital which was negative for PE but showed some disease progression.  She also had moderate left effusion.  The patient has had a Pleurx catheter in the past.  IR was consulted in the hospital but they do not feel there is a significant amount to drain.  She does have a follow-up with pulmonary medicine next week.  She continues to have a  significant cough especially if being too active or talking.  Overall her cough is a little bit better but still significant.  She takes Hycodan.  She also feels like her taste and appetite is improving a little bit.  Has not had any diarrhea since last being seen although she is having constipation.  He also has not been taking her Tagrisso since last being seen.  She was instructed to resume this.  She continues to have similar dyspnea on exertion.  Denies any fever or chills.  She is not a good candidate for Megace for her appetite due to her stroke and DVTs.  She is not a good candidate for Marinol due to intolerance and feeling drowsy.  She is not a good candidate for Remeron due to drug to drug interactions with Tagrisso.  She is here today for evaluation and repeat blood work before undergoing cycle #1 with single agent reduced dose Alimta.   MEDICAL HISTORY: Past Medical History:  Diagnosis Date   Anemia    Anxiety    Arthritis    Asthma    exercise induced   Coronary artery disease    Depression    PMH   Dyspnea    GERD (gastroesophageal reflux disease)    Glaucoma    History of radiation therapy 01/05/2021   IMRT right lung  11/24/2020-01/05/2021  Dr Antony Blackbird   Hypertension    lung ca dx'd 11/2017   right   Malignant pleural effusion    right   Nuclear sclerotic cataract of right eye 02/28/2021   Dr. Sallye Lat, cataract surgery February 2023   PONV (postoperative nausea and vomiting)    Pre-diabetes    Raynaud's disease    Raynaud's disease    Stroke (HCC) 01/2021   balance off, some express aphasia, weakness    ALLERGIES:  is allergic to hydrocodone-acetaminophen, penicillins, lactose, vicodin [hydrocodone-acetaminophen], bacid, and other.  MEDICATIONS:  Current Outpatient Medications  Medication Sig Dispense Refill   acetaminophen (TYLENOL) 500 MG tablet Take 1,000 mg by mouth every 6 (six) hours as needed (pain).     ALPRAZolam (XANAX) 0.25 MG tablet Take  1 tablet (0.25 mg total) by mouth 2 (two) times daily as needed for anxiety. May take 2 tablets (0.5 mg total) at bedtime. 60 tablet 0   amLODipine (NORVASC) 5 MG tablet Take 1 tablet (5 mg total) by mouth daily. 90 tablet 3   benzonatate (TESSALON) 100 MG capsule Take 2 capsules (200 mg total) by mouth 3 (three) times daily as needed for cough. 60 capsule 2   carvedilol (COREG) 12.5 MG tablet Take 1 tablet (12.5 mg total) by mouth 2 (two) times daily with a meal. 180 tablet 3   CHELATED MAGNESIUM PO Take 200 mg by mouth daily.     Cholecalciferol (VITAMIN D) 125 MCG (5000 UT) CAPS Take 1 Capful by mouth daily.     dexamethasone (DECADRON) 4 MG tablet Take 1 tablet twice a day the day before, day of, and day after  treatment 40 tablet 2   docusate sodium (COLACE) 100 MG capsule Take 1 capsule (100 mg total) by mouth 2 (two) times daily. 60 capsule 5   dorzolamide-timolol (COSOPT) 22.3-6.8 MG/ML ophthalmic solution Place 1 drop into both eyes 2 (two) times daily.     doxycycline (VIBRAMYCIN) 100 MG capsule Take 1 capsule (100 mg total) by mouth 2 (two) times daily for 7 days. 14 capsule 0   enoxaparin (LOVENOX) 80 MG/0.8ML injection Inject 80 mg into the skin daily.     ezetimibe (ZETIA) 10 MG tablet Take 10 mg by mouth daily.     FeFum-FePoly-FA-B Cmp-C-Biot (FOLIVANE-PLUS) CAPS Take 1 capsule by mouth in the morning. 90 capsule 0   FLUoxetine (PROZAC) 20 MG capsule Take 1 capsule (20 mg total) by mouth daily. 90 capsule 3   folic acid (FOLVITE) 1 MG tablet TAKE 1 TABLET BY MOUTH EVERY DAY 90 tablet 1   Glycopyrrolate-Formoterol (BEVESPI AEROSPHERE) 9-4.8 MCG/ACT AERO Inhale 2 puffs into the lungs 2 (two) times daily. 10.7 g 3   HYDROcodone bit-homatropine (HYCODAN) 5-1.5 MG/5ML syrup Take 5 mLs by mouth every 6 (six) hours as needed for cough. 240 mL 0   hyoscyamine (LEVSIN SL) 0.125 MG SL tablet Place 1-2 tablets (0.125-0.25mg ) under the tongue every 6 (six) hours as needed for cramping. 30 tablet  3   ipratropium-albuterol (DUONEB) 0.5-2.5 (3) MG/3ML SOLN Take 3 mLs by nebulization every 6 (six) hours as needed (Asthma). 360 mL 3   isosorbide mononitrate (IMDUR) 30 MG 24 hr tablet Take 0.5 tablets (15 mg total) by mouth daily. 90 tablet 3   latanoprost (XALATAN) 0.005 % ophthalmic solution Place 1 drop into both eyes at bedtime.      levofloxacin (LEVAQUIN) 750 MG tablet Take 1 tablet (750 mg total) by mouth every other day. 4 tablet 0   nystatin (MYCOSTATIN) 100000 UNIT/ML suspension Take 5 mLs (500,000 Units total) by mouth 4 (four) times daily. 60 mL 0   ondansetron (ZOFRAN-ODT) 4 MG disintegrating tablet Take 1 tablet (4 mg total) by mouth every 8 (eight) hours as needed for nausea or vomiting. 30 tablet 1   osimertinib mesylate (TAGRISSO) 80 MG tablet Take 1 tablet (80 mg total) by mouth daily. 30 tablet 4   predniSONE (DELTASONE) 10 MG tablet Take 1 tablet (10 mg total) by mouth daily with breakfast. 30 tablet 2   prochlorperazine (COMPAZINE) 10 MG tablet Take 1 tablet (10 mg total) by mouth every 6 (six) hours as needed. 30 tablet 2   prochlorperazine (COMPAZINE) 10 MG tablet Take 1 tablet (10 mg total) by mouth every 6 (six) hours as needed. 30 tablet 2   REPATHA SURECLICK 140 MG/ML SOAJ Inject 140 mg into the skin every 14 (fourteen) days. 6 mL 3   No current facility-administered medications for this visit.    SURGICAL HISTORY:  Past Surgical History:  Procedure Laterality Date   ABDOMINAL HYSTERECTOMY     partial   BRONCHIAL BIOPSY  04/21/2021   Procedure: BRONCHIAL BIOPSIES;  Surgeon: Josephine Igo, DO;  Location: [facility];  Service: Pulmonary;;   BRONCHIAL BRUSHINGS  04/21/2021   Procedure: BRONCHIAL BRUSHINGS;  Surgeon: Josephine Igo, DO;  Location: [facility];  Service: Pulmonary;;   BRONCHIAL NEEDLE ASPIRATION BIOPSY  04/21/2021   Procedure: BRONCHIAL NEEDLE ASPIRATION BIOPSIES;  Surgeon: Josephine Igo, DO;  Location: [facility];  Service: Pulmonary;;    CHEST TUBE INSERTION Right 01/01/2018   Procedure: INSERTION PLEURAL DRAINAGE CATHETER;  Surgeon: Zenaida Niece  Greg Cutter, MD;  Location: [facility] OR;  Service: Thoracic;  Laterality: Right;   CHEST TUBE INSERTION  04/21/2021   Procedure: CHEST TUBE INSERTION;  Surgeon: Josephine Igo, DO;  Location: [facility];  Service: Pulmonary;;   COLONOSCOPY     CORONARY STENT INTERVENTION N/A 06/16/2019   Procedure: CORONARY STENT INTERVENTION;  Surgeon: Corky Crafts, MD;  Location: [facility];  Service: Cardiovascular;  Laterality: N/A;   DILATION AND CURETTAGE OF UTERUS     EYE SURGERY     due to Glaucoma   IR IMAGING GUIDED PORT INSERTION  03/22/2021   IR PORT REPAIR CENTRAL VENOUS ACCESS DEVICE  04/15/2021   LEFT HEART CATH AND CORONARY ANGIOGRAPHY N/A 06/16/2019   Procedure: LEFT HEART CATH AND CORONARY ANGIOGRAPHY;  Surgeon: Corky Crafts, MD;  Location: [facility] INVASIVE CV LAB;  Service: Cardiovascular;  Laterality: N/A;   REMOVAL OF PLEURAL DRAINAGE CATHETER Right 11/07/2018   Procedure: REMOVAL OF PLEURAL DRAINAGE CATHETER;  Surgeon: Kerin Perna, MD;  Location: [facility] OR;  Service: Thoracic;  Laterality: Right;   ROTATOR CUFF REPAIR     TUBAL LIGATION     VIDEO BRONCHOSCOPY WITH ENDOBRONCHIAL NAVIGATION Bilateral 04/21/2021   Procedure: VIDEO BRONCHOSCOPY WITH ENDOBRONCHIAL NAVIGATION;  Surgeon: Josephine Igo, DO;  Location: [facility];  Service: Pulmonary;  Laterality: Bilateral;  ION   WISDOM TOOTH EXTRACTION      REVIEW OF SYSTEMS:   Constitutional: Positive for fatigue and poor appetite. Negative for chills and fever.  HENT: Positive for taste alterations. Negative for mouth sores, nosebleeds, sore throat and trouble swallowing.   Eyes: Negative for eye problems and icterus.  Respiratory: Positive for cough and shortness of breath. Negative for hemoptysis and wheezing.   Cardiovascular: Positive for stable right rib discomfort. Negative for leg swelling.  Gastrointestinal:  Positive for constipation. Negative for abdominal pain, constipation,  nausea and vomiting.  Genitourinary: Negative for bladder incontinence, difficulty urinating, dysuria, frequency and hematuria.   Musculoskeletal: Negative for gait problem, neck pain and neck stiffness.  Skin: Negative for itching and rash.  Neurological: Negative for dizziness, extremity weakness, gait problem, headaches, light-headedness and seizures.  Hematological: Negative for adenopathy. Does not bruise/bleed easily.  Psychiatric/Behavioral: Negative for confusion, depression and sleep disturbance. The patient is not nervous/anxious.     PHYSICAL EXAMINATION:  There were no vitals taken for this visit.  ECOG PERFORMANCE STATUS: 2  Physical Exam  Constitutional: Oriented to person, place, and time and thin appearing female and in no distress.   HENT:  Head: Normocephalic and atraumatic.  Mouth/Throat: Oropharynx is clear and moist. No oropharyngeal exudate.  Eyes: Conjunctivae are normal. Right eye exhibits no discharge. Left eye exhibits no discharge. No scleral icterus.  Neck: Normal range of motion. Neck supple.  Cardiovascular: Normal rate, regular rhythm, normal heart sounds and intact distal pulses.   Pulmonary/Chest: Effort normal. Quiet breath sounds in right lung. No respiratory distress. No wheezes. No rales.  Abdominal: Soft. Bowel sounds are normal. Exhibits no distension and no mass. There is no tenderness.  Musculoskeletal: Normal range of motion. Exhibits no edema.  Lymphadenopathy:    No cervical adenopathy.  Neurological: Alert and oriented to person, place, and time. Exhibits muscle relaxer. Gait normal. Coordination normal.  Skin: Skin is warm and dry. No rash noted. Not diaphoretic. No erythema. No pallor.  Psychiatric: Mood, memory and judgment normal.  Vitals reviewed.  LABORATORY DATA: Lab Results  Component Value Date   WBC 11.9 (H) 09/04/2023  HGB 12.5 09/04/2023   HCT 38.5  09/04/2023   MCV 80.7 09/04/2023   PLT 154 09/04/2023      Chemistry      Component Value Date/Time   NA 133 (L) 09/04/2023 1107   NA 140 03/04/2021 1516   K 4.1 09/04/2023 1107   CL 100 09/04/2023 1107   CO2 24 09/04/2023 1107   BUN 28 (H) 09/04/2023 1107   BUN 21 03/04/2021 1516   CREATININE 1.65 (H) 09/04/2023 1107      Component Value Date/Time   CALCIUM 9.4 09/04/2023 1107   ALKPHOS 72 09/04/2023 1107   AST 22 09/04/2023 1107   ALT 14 09/04/2023 1107   BILITOT 0.3 09/04/2023 1107       RADIOGRAPHIC STUDIES:  NM PET Image Initial (PI) Skull Base To Thigh Result Date: 08/16/2023 CLINICAL DATA:  Subsequent treatment strategy for small cell lung cancer. EXAM: NUCLEAR MEDICINE PET SKULL BASE TO THIGH TECHNIQUE: 6.5 mCi F-18 FDG was injected intravenously. Full-ring PET imaging was performed from the skull base to thigh after the radiotracer. CT data was obtained and used for attenuation correction and anatomic localization. Fasting blood glucose: 109 mg/dl COMPARISON:  16/03/9603. FINDINGS: Mediastinal blood pool activity: SUV max 3.0 Liver activity: SUV max NA NECK: No abnormal hypermetabolism. Incidental CT findings: None. CHEST: Mildly hypermetabolic mediastinal lymph nodes. Index subcarinal lymph node measures approximately 1.3 cm, SUV max 5.6, compared to 8.6 previously. Mildly hypermetabolic parenchymal consolidation and retraction in the apical and medial upper right hemithorax. SUV max 6.9 in the central right lower lobe (4/73). No additional abnormal hypermetabolism. Incidental CT findings: Right IJ Port-A-Cath terminates at the SVC RA junction or high right atrium. Atherosclerotic calcification of the aorta. Heart is at the upper limits of normal in size. Perilymphatic nodularity and septal thickening throughout the right upper, right middle and right lower lobes with a thin rind of mildly hypermetabolic pleural fluid/thickening in the right hemithorax. Trace left pleural  effusion. ABDOMEN/PELVIS: No abnormal hypermetabolism. Incidental CT findings: None. SKELETON: No abnormal hypermetabolism. Incidental CT findings: Degenerative changes in the spine. IMPRESSION: 1. Post treatment parenchymal consolidation and retraction with hypermetabolism in the central right lower lobe, worrisome for disease recurrence. Perilymphatic nodularity and thickening in the right hemithorax, most indicative of lymphangitic carcinomatosis. Associated hypermetabolic mediastinal lymph nodes. 2. Likely malignant rind of pleural fluid/thickening in the right hemithorax. 3. Trace left pleural fluid. 4.  Aortic atherosclerosis (ICD10-I70.0). Electronically Signed   By: Leanna Battles M.D.   On: 08/16/2023 14:22     ASSESSMENT/PLAN:  This is a very pleasant 73 year old never smoker African-American female diagnosed with stage IV non-small cell lung cancer, adenocarcinoma.  She was positive for an EGFR mutation with deletion in exon 19.  She was diagnosed in July 2019 and presented with right upper lobe lung mass in addition to mediastinal lymphadenopathy as well as bilateral pulmonary nodules and malignant right pleural effusion.   The patient was started on treatment with Tagrisso 80 mg p.o. daily status post 39 months of treatment. Started on 01/29/2018.  In April 2022, she showed evidence of disease progression with interval progression of the right apical lung mass in addition to progression of mediastinal lymphadenopathy concerning for worsening of her disease. She had repeat Guardant 360 molecular studies which did not show evidence of new resistant mutations.  Therefore, she was referred to radiation oncology and completed palliative radiotherapy to the enlarging right upper lobe lung mass and mediastinum under the care of Dr. Roselind Messier.  This was completed in July 2022.    The patient underwent a repeat bronchoscopy and biopsy for repeat molecular testing.  The sample from the left and right lung  biopsy was negative for malignancy. This was performed in November 2022.    Unfortunately, the patient  was found to have evidence of disease progression.  Therefore, Dr. [name] started the patient on systemic chemotherapy with carboplatin for AUC of 5 and Alimta 500 mg per metered squared.  She is status post 23 cycles and she tolerated it fairly well despite her ongoing issues with fatigue, chest tightness, cough, and weight loss.  Alimta was reduced to 400 mg per metered squared due to renal insufficiency.  She is not a good candidate for Avastin due to her recent CVA in August 2022.  She also is continuing to take Tagrisso as it is protective against progressive metastatic disease to the brain. Starting from cycle #7,  she started maintenance single agent alimta. Her treatment has been on hold since March 2024 due to side effects of treatment.    Of note, the patient saw Dr. Eber Hong from [facility] for second opinion in November 2022.  They discussed if she progresses on chemotherapy that there may be upcoming options. He discussed other treatment options which may be available in 2023 including patritumab deruxtcan and lazertinib/amivantmab. If these are not available, he wrote that they can determine if they can get the agents through an expanded access, single patient IND, or clinical trial if she has progression with chemotherapy.     Her most recent restaging CT scan showed disease progression with new and increasing septal thickening and perilymphatic nodularity throughout the right hemithorax suspicious for lymphangitic spread of tumor.  A PET scan to confirm this.   Therefore, at her last appointment on 08/23/2023 we discussed the options.  She had repeat molecular studies performed which did not show any new actionable mutations.  Therefore the plan will be to proceed with single agent Alimta IV every 3 weeks as scheduled.  The alternative options that we discussed was adding  carboplatin to the Alimta.  We also talked about amivantamab and lazertinib, but the patient and Dr. [name] have concerns about quality of life and side effect profile, although this likely would be effective.   She was hospitalized for bilateral upper extremity DVTs.  During her hospitalization she had a repeat CT scan of the chest which was negative for PE but showed some disease progression.  Dr. [name] is aware as well as the patient.  Since the patient has not started on any treatment, we will use this as her new baseline scan.  Dr. [name] is okay with her starting her chemotherapy today since she has had improvement in her creatinine.  The patient was instructed to continue to hydrate well at home.  If she ever feels like she needs to come in for IV fluids she knows she can call us.  She was found to have a moderate left effusion in the hospital but IR evaluated her and did not feel like there was a significant amount of fluid for drainage.  We reviewed signs and symptoms to monitor for that would warrant reevaluation.  She is also scheduled to see pulmonary medicine next week and will talk to them about the effusions.  Of note the patient has had Pleurx catheter in the past.  The patient I also discussed that she is at high risk for PE she understands Lovenox will be  a long-term medication in the setting of malignancy and hypercoagulable state.  I let her know we be happy to refill her Lovenox and to call us as her supply is running out.  She currently has a month supply.  Discussed the importance of not missing doses.  Her dose was increased upon discharge from the hospital.  Should she develop any acute breathing changes worse than her current baseline symptoms related to her malignancy such as chest pain, worsening shortness of breath, lightheadedness, syncope, unstable vitals such as hypoxia or tachycardia, etc. to seek emergency evaluation.  Labs were reviewed.  Her creatinine is 1.22 today.    Recommend that she proceed with cycle #1 today as scheduled.   We will see her back for labs and follow up in 3 weeks before undergoing cycle #2.  Her Alimta dose is 400 mg/m.  We can consider increasing the dose based on tolerance at her subsequent appointments.  She will continue taking prednisone 10 mg p.o. daily.  He will continue taking Hycodan for cough.  She will continue taking her folic acid.  She will continue taking Tagrisso for neuroprotection.  She has not taken Tagrisso since last week and I recommended that she resume taking this.  She will continue taking a PPI for heartburn and reflux.  Also discussed diarrhea and constipation education today.  She is scheduled to see palliative care today and I let them know will be discussed at our appointment.  She is going to talk to them about possibly adding on long-acting pain medication.   They also tell me that home health is coming out to her house to see if she would benefit from their services.  The patient was advised to call immediately if she has any concerning symptoms in the interval. The patient voices understanding of current disease status and treatment options and is in agreement with the current care plan. All questions were answered. The patient knows to call the clinic with any problems, questions or concerns. We can certainly see the patient much sooner if necessary  No orders of the defined types were placed in this encounter.    The total time spent in the appointment was 30-39 minutes  Verlene Glantz L Lofton Leon, PA-C 09/06/23

## 2023-09-07 ENCOUNTER — Emergency Department (HOSPITAL_COMMUNITY)

## 2023-09-07 ENCOUNTER — Encounter (HOSPITAL_COMMUNITY): Payer: Self-pay | Admitting: Internal Medicine

## 2023-09-07 ENCOUNTER — Inpatient Hospital Stay (HOSPITAL_COMMUNITY)
Admission: EM | Admit: 2023-09-07 | Discharge: 2023-09-10 | DRG: 299 | Disposition: A | Discharge status: Home Health | Attending: Obstetrics and Gynecology | Admitting: Obstetrics and Gynecology

## 2023-09-07 ENCOUNTER — Other Ambulatory Visit: Payer: Self-pay

## 2023-09-07 ENCOUNTER — Inpatient Hospital Stay

## 2023-09-07 DIAGNOSIS — I252 Old myocardial infarction: Secondary | ICD-10-CM

## 2023-09-07 DIAGNOSIS — F32A Depression, unspecified: Secondary | ICD-10-CM | POA: Diagnosis present

## 2023-09-07 DIAGNOSIS — I2699 Other pulmonary embolism without acute cor pulmonale: Secondary | ICD-10-CM | POA: Diagnosis present

## 2023-09-07 DIAGNOSIS — I25118 Atherosclerotic heart disease of native coronary artery with other forms of angina pectoris: Secondary | ICD-10-CM | POA: Diagnosis present

## 2023-09-07 DIAGNOSIS — J910 Malignant pleural effusion: Secondary | ICD-10-CM | POA: Diagnosis present

## 2023-09-07 DIAGNOSIS — E785 Hyperlipidemia, unspecified: Secondary | ICD-10-CM | POA: Diagnosis present

## 2023-09-07 DIAGNOSIS — R630 Anorexia: Secondary | ICD-10-CM | POA: Diagnosis present

## 2023-09-07 DIAGNOSIS — Z885 Allergy status to narcotic agent status: Secondary | ICD-10-CM

## 2023-09-07 DIAGNOSIS — M7989 Other specified soft tissue disorders: Secondary | ICD-10-CM

## 2023-09-07 DIAGNOSIS — J45909 Unspecified asthma, uncomplicated: Secondary | ICD-10-CM | POA: Diagnosis present

## 2023-09-07 DIAGNOSIS — Z90710 Acquired absence of both cervix and uterus: Secondary | ICD-10-CM

## 2023-09-07 DIAGNOSIS — E7800 Pure hypercholesterolemia, unspecified: Secondary | ICD-10-CM | POA: Diagnosis not present

## 2023-09-07 DIAGNOSIS — Z66 Do not resuscitate: Secondary | ICD-10-CM | POA: Diagnosis present

## 2023-09-07 DIAGNOSIS — C3491 Malignant neoplasm of unspecified part of right bronchus or lung: Secondary | ICD-10-CM | POA: Diagnosis present

## 2023-09-07 DIAGNOSIS — I10 Essential (primary) hypertension: Secondary | ICD-10-CM | POA: Diagnosis present

## 2023-09-07 DIAGNOSIS — I82403 Acute embolism and thrombosis of unspecified deep veins of lower extremity, bilateral: Secondary | ICD-10-CM | POA: Diagnosis present

## 2023-09-07 DIAGNOSIS — G893 Neoplasm related pain (acute) (chronic): Secondary | ICD-10-CM | POA: Diagnosis present

## 2023-09-07 DIAGNOSIS — I69320 Aphasia following cerebral infarction: Secondary | ICD-10-CM | POA: Diagnosis not present

## 2023-09-07 DIAGNOSIS — Z880 Allergy status to penicillin: Secondary | ICD-10-CM

## 2023-09-07 DIAGNOSIS — C779 Secondary and unspecified malignant neoplasm of lymph node, unspecified: Secondary | ICD-10-CM | POA: Diagnosis present

## 2023-09-07 DIAGNOSIS — Z955 Presence of coronary angioplasty implant and graft: Secondary | ICD-10-CM

## 2023-09-07 DIAGNOSIS — C7951 Secondary malignant neoplasm of bone: Secondary | ICD-10-CM | POA: Diagnosis present

## 2023-09-07 DIAGNOSIS — I82623 Acute embolism and thrombosis of deep veins of upper extremity, bilateral: Principal | ICD-10-CM | POA: Diagnosis present

## 2023-09-07 DIAGNOSIS — I7300 Raynaud's syndrome without gangrene: Secondary | ICD-10-CM | POA: Diagnosis present

## 2023-09-07 DIAGNOSIS — D630 Anemia in neoplastic disease: Secondary | ICD-10-CM | POA: Diagnosis present

## 2023-09-07 DIAGNOSIS — Z923 Personal history of irradiation: Secondary | ICD-10-CM

## 2023-09-07 DIAGNOSIS — I82621 Acute embolism and thrombosis of deep veins of right upper extremity: Secondary | ICD-10-CM | POA: Diagnosis present

## 2023-09-07 DIAGNOSIS — Z79899 Other long term (current) drug therapy: Secondary | ICD-10-CM

## 2023-09-07 DIAGNOSIS — K219 Gastro-esophageal reflux disease without esophagitis: Secondary | ICD-10-CM | POA: Diagnosis present

## 2023-09-07 DIAGNOSIS — R4701 Aphasia: Secondary | ICD-10-CM | POA: Diagnosis present

## 2023-09-07 DIAGNOSIS — I82B11 Acute embolism and thrombosis of right subclavian vein: Secondary | ICD-10-CM | POA: Diagnosis present

## 2023-09-07 DIAGNOSIS — Z515 Encounter for palliative care: Secondary | ICD-10-CM | POA: Diagnosis not present

## 2023-09-07 DIAGNOSIS — Z7952 Long term (current) use of systemic steroids: Secondary | ICD-10-CM

## 2023-09-07 DIAGNOSIS — R54 Age-related physical debility: Secondary | ICD-10-CM | POA: Diagnosis present

## 2023-09-07 DIAGNOSIS — Z801 Family history of malignant neoplasm of trachea, bronchus and lung: Secondary | ICD-10-CM

## 2023-09-07 DIAGNOSIS — I82A11 Acute embolism and thrombosis of right axillary vein: Secondary | ICD-10-CM | POA: Diagnosis present

## 2023-09-07 DIAGNOSIS — F419 Anxiety disorder, unspecified: Secondary | ICD-10-CM | POA: Diagnosis present

## 2023-09-07 DIAGNOSIS — I82C13 Acute embolism and thrombosis of internal jugular vein, bilateral: Secondary | ICD-10-CM | POA: Diagnosis present

## 2023-09-07 DIAGNOSIS — C3490 Malignant neoplasm of unspecified part of unspecified bronchus or lung: Secondary | ICD-10-CM

## 2023-09-07 DIAGNOSIS — Z6821 Body mass index (BMI) 21.0-21.9, adult: Secondary | ICD-10-CM

## 2023-09-07 DIAGNOSIS — Z7901 Long term (current) use of anticoagulants: Secondary | ICD-10-CM

## 2023-09-07 DIAGNOSIS — R052 Subacute cough: Secondary | ICD-10-CM | POA: Diagnosis not present

## 2023-09-07 DIAGNOSIS — M79602 Pain in left arm: Secondary | ICD-10-CM | POA: Diagnosis present

## 2023-09-07 DIAGNOSIS — Z8249 Family history of ischemic heart disease and other diseases of the circulatory system: Secondary | ICD-10-CM

## 2023-09-07 DIAGNOSIS — Z7189 Other specified counseling: Secondary | ICD-10-CM | POA: Diagnosis not present

## 2023-09-07 DIAGNOSIS — Z9221 Personal history of antineoplastic chemotherapy: Secondary | ICD-10-CM

## 2023-09-07 DIAGNOSIS — Z886 Allergy status to analgesic agent status: Secondary | ICD-10-CM

## 2023-09-07 DIAGNOSIS — R7303 Prediabetes: Secondary | ICD-10-CM | POA: Diagnosis present

## 2023-09-07 DIAGNOSIS — N179 Acute kidney failure, unspecified: Secondary | ICD-10-CM | POA: Diagnosis present

## 2023-09-07 DIAGNOSIS — R531 Weakness: Secondary | ICD-10-CM | POA: Diagnosis not present

## 2023-09-07 DIAGNOSIS — Z91199 Patient's noncompliance with other medical treatment and regimen due to unspecified reason: Secondary | ICD-10-CM

## 2023-09-07 LAB — CBC WITH DIFFERENTIAL/PLATELET
Abs Immature Granulocytes: 0.04 10*3/uL (ref 0.00–0.07)
Basophils Absolute: 0 10*3/uL (ref 0.0–0.1)
Basophils Relative: 0 %
Eosinophils Absolute: 0 10*3/uL (ref 0.0–0.5)
Eosinophils Relative: 0 %
HCT: 37.6 % (ref 36.0–46.0)
Hemoglobin: 11.6 g/dL — ABNORMAL LOW (ref 12.0–15.0)
Immature Granulocytes: 0 %
Lymphocytes Relative: 11 %
Lymphs Abs: 1.1 10*3/uL (ref 0.7–4.0)
MCH: 26.1 pg (ref 26.0–34.0)
MCHC: 30.9 g/dL (ref 30.0–36.0)
MCV: 84.7 fL (ref 80.0–100.0)
Monocytes Absolute: 0.4 10*3/uL (ref 0.1–1.0)
Monocytes Relative: 5 %
Neutro Abs: 7.9 10*3/uL — ABNORMAL HIGH (ref 1.7–7.7)
Neutrophils Relative %: 84 %
Platelets: 125 10*3/uL — ABNORMAL LOW (ref 150–400)
RBC: 4.44 MIL/uL (ref 3.87–5.11)
RDW: 14 % (ref 11.5–15.5)
WBC: 9.5 10*3/uL (ref 4.0–10.5)
nRBC: 0 % (ref 0.0–0.2)

## 2023-09-07 LAB — COMPREHENSIVE METABOLIC PANEL
ALT: 18 U/L (ref 0–44)
AST: 21 U/L (ref 15–41)
Albumin: 2.9 g/dL — ABNORMAL LOW (ref 3.5–5.0)
Alkaline Phosphatase: 59 U/L (ref 38–126)
Anion gap: 9 (ref 5–15)
BUN: 15 mg/dL (ref 8–23)
CO2: 23 mmol/L (ref 22–32)
Calcium: 9 mg/dL (ref 8.9–10.3)
Chloride: 105 mmol/L (ref 98–111)
Creatinine, Ser: 1.09 mg/dL — ABNORMAL HIGH (ref 0.44–1.00)
GFR, Estimated: 54 mL/min — ABNORMAL LOW (ref >60)
Glucose, Bld: 110 mg/dL — ABNORMAL HIGH (ref 70–99)
Potassium: 3.6 mmol/L (ref 3.5–5.1)
Sodium: 137 mmol/L (ref 135–145)
Total Bilirubin: 0.5 mg/dL (ref 0.0–1.2)
Total Protein: 6 g/dL — ABNORMAL LOW (ref 6.5–8.1)

## 2023-09-07 MED ORDER — FLUOXETINE HCL 20 MG PO CAPS
20.0000 mg | ORAL_CAPSULE | Freq: Every day | ORAL | Status: DC
  Administered 2023-09-08 – 2023-09-10 (×3): 20 mg via ORAL
  Filled 2023-09-07 (×4): qty 1

## 2023-09-07 MED ORDER — ALPRAZOLAM 0.25 MG PO TABS
0.2500 mg | ORAL_TABLET | Freq: Two times a day (BID) | ORAL | Status: DC | PRN
  Administered 2023-09-07 – 2023-09-08 (×2): 0.25 mg via ORAL
  Filled 2023-09-07 (×3): qty 1

## 2023-09-07 MED ORDER — DOCUSATE SODIUM 100 MG PO CAPS
100.0000 mg | ORAL_CAPSULE | Freq: Two times a day (BID) | ORAL | Status: DC
  Administered 2023-09-07 – 2023-09-10 (×6): 100 mg via ORAL
  Filled 2023-09-07 (×7): qty 1

## 2023-09-07 MED ORDER — ISOSORBIDE MONONITRATE ER 30 MG PO TB24
15.0000 mg | ORAL_TABLET | Freq: Every day | ORAL | Status: DC
  Administered 2023-09-07 – 2023-09-10 (×4): 15 mg via ORAL
  Filled 2023-09-07 (×4): qty 1

## 2023-09-07 MED ORDER — FENTANYL CITRATE PF 50 MCG/ML IJ SOSY
12.5000 ug | PREFILLED_SYRINGE | INTRAMUSCULAR | Status: DC | PRN
  Administered 2023-09-07 – 2023-09-08 (×8): 50 ug via INTRAVENOUS
  Filled 2023-09-07 (×8): qty 1

## 2023-09-07 MED ORDER — HYOSCYAMINE SULFATE 0.125 MG SL SUBL: 0.1250 mg | SUBLINGUAL_TABLET | Freq: Four times a day (QID) | SUBLINGUAL | Status: DC | PRN

## 2023-09-07 MED ORDER — FENTANYL CITRATE PF 50 MCG/ML IJ SOSY
50.0000 ug | PREFILLED_SYRINGE | Freq: Once | INTRAMUSCULAR | Status: AC
  Administered 2023-09-07: 50 ug via INTRAVENOUS
  Filled 2023-09-07: qty 1

## 2023-09-07 MED ORDER — CHLORHEXIDINE GLUCONATE CLOTH 2 % EX PADS
6.0000 | MEDICATED_PAD | Freq: Every day | CUTANEOUS | Status: DC
  Administered 2023-09-08 – 2023-09-10 (×2): 6 via TOPICAL

## 2023-09-07 MED ORDER — CARVEDILOL 12.5 MG PO TABS
12.5000 mg | ORAL_TABLET | Freq: Two times a day (BID) | ORAL | Status: DC
  Administered 2023-09-08 – 2023-09-10 (×5): 12.5 mg via ORAL
  Filled 2023-09-07 (×5): qty 1

## 2023-09-07 MED ORDER — HEPARIN (PORCINE) 25000 UT/250ML-% IV SOLN
900.0000 [IU]/h | INTRAVENOUS | Status: DC
Start: 2023-09-07 — End: 2023-09-07
  Filled 2023-09-07: qty 250

## 2023-09-07 MED ORDER — OSIMERTINIB MESYLATE 80 MG PO TABS: 80.0000 mg | ORAL_TABLET | Freq: Every day | ORAL | Status: DC

## 2023-09-07 MED ORDER — IOHEXOL 350 MG/ML SOLN: 75.0000 mL | Freq: Once | INTRAVENOUS | Status: DC | PRN

## 2023-09-07 MED ORDER — VITAMIN D 25 MCG (1000 UNIT) PO TABS
5000.0000 [IU] | ORAL_TABLET | Freq: Every day | ORAL | Status: DC
  Administered 2023-09-07 – 2023-09-10 (×4): 5000 [IU] via ORAL
  Filled 2023-09-07 (×4): qty 5

## 2023-09-07 MED ORDER — BENZONATATE 100 MG PO CAPS
200.0000 mg | ORAL_CAPSULE | Freq: Three times a day (TID) | ORAL | Status: DC
  Filled 2023-09-07 (×2): qty 2

## 2023-09-07 MED ORDER — ONDANSETRON HCL 4 MG/2ML IJ SOLN: 4.0000 mg | Freq: Four times a day (QID) | INTRAMUSCULAR | Status: DC | PRN

## 2023-09-07 MED ORDER — ENOXAPARIN SODIUM 60 MG/0.6ML IJ SOSY
55.0000 mg | PREFILLED_SYRINGE | Freq: Two times a day (BID) | INTRAMUSCULAR | Status: DC
  Administered 2023-09-07 – 2023-09-10 (×6): 55 mg via SUBCUTANEOUS
  Filled 2023-09-07 (×7): qty 0.6

## 2023-09-07 MED ORDER — ONDANSETRON HCL 4 MG PO TABS: 4.0000 mg | ORAL_TABLET | Freq: Four times a day (QID) | ORAL | Status: DC | PRN

## 2023-09-07 MED ORDER — SODIUM CHLORIDE 0.9% FLUSH: 10.0000 mL | INTRAVENOUS | Status: DC | PRN

## 2023-09-07 MED ORDER — SODIUM CHLORIDE 0.9% FLUSH
10.0000 mL | Freq: Two times a day (BID) | INTRAVENOUS | Status: DC
  Administered 2023-09-07 – 2023-09-10 (×6): 10 mL

## 2023-09-07 MED ORDER — IPRATROPIUM-ALBUTEROL 0.5-2.5 (3) MG/3ML IN SOLN: 3.0000 mL | Freq: Four times a day (QID) | RESPIRATORY_TRACT | Status: DC | PRN

## 2023-09-07 MED ORDER — DORZOLAMIDE HCL-TIMOLOL MAL 2-0.5 % OP SOLN
1.0000 [drp] | Freq: Two times a day (BID) | OPHTHALMIC | Status: DC
  Administered 2023-09-07 – 2023-09-10 (×6): 1 [drp] via OPHTHALMIC
  Filled 2023-09-07: qty 10

## 2023-09-07 MED ORDER — SODIUM CHLORIDE (PF) 0.9 % IJ SOLN
INTRAMUSCULAR | Status: AC
  Filled 2023-09-07: qty 50

## 2023-09-07 MED ORDER — EZETIMIBE 10 MG PO TABS
10.0000 mg | ORAL_TABLET | Freq: Every day | ORAL | Status: DC
Start: 2023-09-08 — End: 2023-09-10
  Administered 2023-09-08 – 2023-09-10 (×3): 10 mg via ORAL
  Filled 2023-09-07 (×3): qty 1

## 2023-09-07 MED ORDER — NYSTATIN 100000 UNIT/ML MT SUSP
5.0000 mL | Freq: Four times a day (QID) | OROMUCOSAL | Status: DC
  Filled 2023-09-07 (×4): qty 5

## 2023-09-07 MED ORDER — HYDROCODONE BIT-HOMATROP MBR 5-1.5 MG/5ML PO SOLN
5.0000 mL | Freq: Four times a day (QID) | ORAL | Status: DC | PRN
  Administered 2023-09-09 (×2): 5 mL via ORAL
  Filled 2023-09-07 (×3): qty 5

## 2023-09-07 MED ORDER — SODIUM CHLORIDE 0.9 % IV BOLUS
1000.0000 mL | Freq: Once | INTRAVENOUS | Status: AC
  Administered 2023-09-07: 1000 mL via INTRAVENOUS

## 2023-09-07 MED ORDER — ACETAMINOPHEN 500 MG PO TABS: 1000.0000 mg | ORAL_TABLET | Freq: Four times a day (QID) | ORAL | Status: DC | PRN

## 2023-09-07 MED ORDER — PREDNISONE 10 MG PO TABS
10.0000 mg | ORAL_TABLET | Freq: Every day | ORAL | Status: DC
Start: 2023-09-08 — End: 2023-09-10
  Administered 2023-09-08 – 2023-09-09 (×2): 10 mg via ORAL
  Filled 2023-09-07 (×2): qty 1

## 2023-09-07 NOTE — Progress Notes (Addendum)
 PHARMACY - ANTICOAGULATION CONSULT NOTE  Pharmacy Consult for heparin  Indication: hx CVA and DVT/PE; new acute Bilateral UE DVTs (on full dose LMWH PTA)   Allergies  Allergen Reactions   Hydrocodone-Acetaminophen Other (See Comments)   Penicillins Other (See Comments)    SYNCOPE PATIENT HAS HAD A PCN REACTION WITH IMMEDIATE RASH, FACIAL/TONGUE/THROAT SWELLING, SOB, OR LIGHTHEADEDNESS WITH HYPOTENSION:  #  #  YES  #  # Has patient had a PCN reaction causing severe rash involving mucus membranes or skin necrosis: No Has patient had a PCN reaction that required hospitalization: No Has patient had a PCN reaction occurring within the last 10 years: No  Other reaction(s): other   Lactose Other (See Comments)    Allergy to all dairy products Abdominal cramping   Vicodin [Hydrocodone-Acetaminophen] Other (See Comments)    Sped up heart and breathing   Bacid Nausea And Vomiting   Other Other (See Comments)    Glaucoma eye drop - turned eyes dark    Patient Measurements: Height: 5\' 4"  (162.6 cm) Weight: 56.2 kg (124 lb) IBW/kg (Calculated) : 54.7 Heparin Dosing Weight: 56 kg  Vital Signs: Temp: 97.9 F (36.6 C) (03/21 1503) Temp Source: Oral (03/21 1503) BP: 153/73 (03/21 1440) Pulse Rate: 60 (03/21 1440)  Labs: Recent Labs    09/07/23 1302  HGB 11.6*  HCT 37.6  PLT 125*  CREATININE 1.09*    Estimated Creatinine Clearance: 40.3 mL/min (A) (by C-G formula based on SCr of 1.09 mg/dL (H)).   Medical History: Past Medical History:  Diagnosis Date   Anemia    Anxiety    Arthritis    Asthma    exercise induced   Coronary artery disease    Depression    PMH   Dyspnea    GERD (gastroesophageal reflux disease)    Glaucoma    History of radiation therapy 01/05/2021   IMRT right lung  11/24/2020-01/05/2021  Dr Antony Blackbird   Hypertension    lung ca dx'd 11/2017   right   Malignant pleural effusion    right   Nuclear sclerotic cataract of right eye 02/28/2021   Dr.  Sallye Lat, cataract surgery February 2023   PONV (postoperative nausea and vomiting)    Pre-diabetes    Raynaud's disease    Raynaud's disease    Stroke (HCC) 01/2021   balance off, some express aphasia, weakness    Medications:  - Per pt, on LMWH 80 mg q24h PTA with last dose taken on 09/06/23 at 6:30p.  She reported forgetting to take her dose on 3/18 and 3/19 as well as forgetting to take 1-2 doses per week for the past month.   Assessment: Patient is a 73 y.o F with NSCLC and hx CVA and PE/DVT on full dose LMWH PTA, who presented to the ED on 09/07/23 with c/o cough, SOB, and LUE swelling.  She reported missing 1-2 doses of her LMWH per week for the past month.  UE doppler on 09/07/23 showed acute extensive bilateral UE DVT. Pharmacy has been consulted to dose heparin for VTE treatment.  Today, 09/07/2023: - scr 1.09 - plts 125K; hgb ok  Goal of Therapy:  Heparin level 0.3-0.7 units/ml Monitor platelets by anticoagulation protocol: Yes   Plan:  - heparin 900 units/hr per [place] calc - check 8 hr heparin level  - monitor for s/sx bleeding   Allison [name] P 09/07/2023,4:53 PM  ____________________________________  Adden: MD requested pharmacy to change to lovenox.  - Will d/c  heparin drip and start lovenox 55 mg SQ q12h  Allison [name], PharmD, BCPS 09/07/2023 5:35 PM

## 2023-09-07 NOTE — Progress Notes (Signed)
 BUE venous duplex has been completed.  Preliminary results given to Dr. Suezanne Jacquet.   Results can be found under chart review under CV PROC. 09/07/2023 4:51 PM Jaydrien Wassenaar RVT, RDMS

## 2023-09-07 NOTE — Progress Notes (Signed)
 Prior-To-Admission Oral Chemotherapy for Treatment of Oncologic Disease   Order noted from Dr. Jerral Ralph to continue prior-to-admission oral chemotherapy regimen of osimertinib (Tagrisso).  Procedure Per Pharmacy & Therapeutics Committee Policy: Orders for continuation of home oral chemotherapy for treatment of an oncologic disease will be held unless approved by an oncologist during current admission.    For patients receiving oncology care at [facility], inpatient pharmacist contacts patient's oncologist during regular office hours to review. If earlier review is medically necessary, attending physician consults [facility] on-call oncologist   For patients receiving oncology care outside of [facility], attending physician consults patient's oncologist to review. If this oncologist or their coverage cannot be reached, attending physician consults [facility] on-call oncologist   Oral chemotherapy continuation order is on hold pending oncologist review, [facility] oncologist [name] will be notified by inpatient pharmacy during office hours     Arley Phenix RPh 09/07/2023, 9:55 PM

## 2023-09-07 NOTE — ED Triage Notes (Signed)
 PT BIB EMS coming from home. Pt c/o choking on cough syrup this morning. States chest hurting since then Having SOB and cough upon arrival. Hx: right lung cancer, has an appt at 10am today at [facility].

## 2023-09-07 NOTE — ED Notes (Signed)
 Ultrasound at bedside

## 2023-09-07 NOTE — ED Provider Notes (Signed)
 Patient signed out to me at 1630 by Dr. Suezanne Jacquet pending admission.  In short this is a 73 year old female with a past medical history of lung cancer and was initially planned to start on IV chemotherapy today but presented to the emergency department for shortness of breath following a choking episode.  Patient had workup today that showed mild increased size of her right sided pleural effusion.  Patient was also complaining of increased swelling in her left upper extremity.  She is on home Lovenox.  Her DVT ultrasound was positive for bilateral upper extremity DVTs.  Plan will be for CT PE study to evaluate for extent of clot and starting on IV heparin and she will require admission for VTE despite Lovenox.   Rexford Maus, DO 09/07/23 1644

## 2023-09-07 NOTE — ED Provider Notes (Signed)
 [facility] Provider Note  CSN: [id number] Arrival date & time: 09/07/23 1011  Chief Complaint(s) Choking  HPI GENIENE LIST is a 73 y.o. female history of metastatic lung cancer presenting to the emergency department with cough.  Patient reports that she has a chronic cough, takes Hycodan.  When taking this morning's dose, patient felt like it went down the wrong pipe and developed a coughing spell.  She reports initially had shortness of breath, called paramedics who brought her to the ER.  She reports feeling better currently, has some chest tightness and some residual cough, mild residual shortness of breath but overall much improved.  No fevers.  Otherwise has been doing well recently.  Had a cancer center appointment today for fluid infusion as she is scheduled for chemotherapy and recently had a mild AKI on lab test.  She would like to maintain this appointment.   Past Medical History Past Medical History:  Diagnosis Date   Anemia    Anxiety    Arthritis    Asthma    exercise induced   Coronary artery disease    Depression    PMH   Dyspnea    GERD (gastroesophageal reflux disease)    Glaucoma    History of radiation therapy 01/05/2021   IMRT right lung  11/24/2020-01/05/2021  Dr Antony Blackbird   Hypertension    lung ca dx'd 11/2017   right   Malignant pleural effusion    right   Nuclear sclerotic cataract of right eye 02/28/2021   Dr. Sallye Lat, cataract surgery February 2023   PONV (postoperative nausea and vomiting)    Pre-diabetes    Raynaud's disease    Raynaud's disease    Stroke (HCC) 01/2021   balance off, some express aphasia, weakness   Patient Active Problem List   Diagnosis Date Noted   Acute bilateral deep vein thrombosis (DVT) of upper extremities (HCC) 09/07/2023   Abnormal glucose level 01/18/2022   Anemia 10/07/2021   Neutropenia (HCC) 10/07/2021   Pseudophakia of right eye 09/22/2021   Port-A-Cath in  place 07/07/2021   Protein-calorie malnutrition, severe 04/25/2021   Pneumothorax after biopsy 04/21/2021   Lung nodule 04/01/2021   Epiretinal membrane, right eye 02/28/2021   Nuclear sclerotic cataract of left eye 02/28/2021   Coronary artery disease of native artery of native heart with stable angina pectoris (HCC)    CVA (cerebral vascular accident) (HCC) 02/12/2021   Current use of long term anticoagulation 02/12/2021   Hypokalemia 02/12/2021   Leg DVT (deep venous thromboembolism), acute, bilateral (HCC) 02/08/2021   Aortic atherosclerosis (HCC) 02/08/2021   Respiratory failure (HCC) 02/08/2021   Postobstructive pneumonia 02/07/2021   Pulmonary embolism (HCC) 02/07/2021   Elevated troponin 02/07/2021   Encounter to establish care 02/02/2021   Shortness of breath 02/02/2021   Cough 02/02/2021   Statin myopathy 12/03/2020   Acute non-recurrent frontal sinusitis 11/18/2020   NSCLC with EGFR mutation (HCC) 06/30/2020   Hx of chest tube placement 02/11/2020   History of chest tube placement 11/12/2019   Abnormal liver function 08/20/2019   Gastroesophageal reflux disease without esophagitis 08/20/2019   Generalized anxiety disorder 08/20/2019   Glaucoma 08/20/2019   Hyperlipidemia 08/20/2019   Menopausal syndrome 08/20/2019   Raynaud's phenomenon 08/20/2019   Vitamin D deficiency 08/20/2019   Status post coronary artery stent placement    History of non-ST elevation myocardial infarction (NSTEMI) 06/12/2019   Hypertension 03/24/2019   Encounter for antineoplastic chemotherapy 01/22/2018  Adenocarcinoma of right lung, stage 4 (HCC) 01/04/2018   Goals of care, counseling/discussion 01/04/2018   Thyroid nodule 12/11/2017   Pleural effusion, right 12/11/2017   Heel pain 03/29/2015   Insomnia 04/20/2014   Home Medication(s) Prior to Admission medications   Medication Sig Start Date End Date Taking? Authorizing Provider  acetaminophen (TYLENOL) 500 MG tablet Take 1,000 mg by  mouth every 6 (six) hours as needed (pain).   Yes [provider]  ALPRAZolam (XANAX) 0.25 MG tablet Take 1 tablet (0.25 mg total) by mouth 2 (two) times daily as needed for anxiety. May take 2 tablets (0.5 mg total) at bedtime. Patient taking differently: Take 0.25-0.5 mg by mouth 2 (two) times daily as needed for anxiety or sleep. 11/23/22  Yes Pickenpack-Cousar, Arty Baumgartner, NP  amLODipine (NORVASC) 5 MG tablet Take 1 tablet (5 mg total) by mouth daily. 05/28/23  Yes Jodelle Gross, NP  carvedilol (COREG) 12.5 MG tablet Take 1 tablet (12.5 mg total) by mouth 2 (two) times daily with a meal. 05/28/23  Yes Jodelle Gross, NP  docusate sodium (COLACE) 100 MG capsule Take 1 capsule (100 mg total) by mouth 2 (two) times daily. 09/02/22  Yes Pickenpack-Cousar, Arty Baumgartner, NP  dorzolamide-timolol (COSOPT) 22.3-6.8 MG/ML ophthalmic solution Place 1 drop into both eyes 2 (two) times daily.   Yes [provider]  enoxaparin (LOVENOX) 80 MG/0.8ML injection Inject 80 mg into the skin every evening.   Yes [provider]  ezetimibe (ZETIA) 10 MG tablet Take 10 mg by mouth daily. 02/25/22  Yes [provider]  FeFum-FePoly-FA-B Cmp-C-Biot (FOLIVANE-PLUS) CAPS Take 1 capsule by mouth in the morning. Patient taking differently: Take 1 capsule by mouth every other day. 07/21/22  Yes Heilingoetter, Cassandra L, PA-C  FLUoxetine (PROZAC) 20 MG capsule Take 1 capsule (20 mg total) by mouth daily. 12/06/21  Yes Reed, Tiffany L, DO  folic acid (FOLVITE) 1 MG tablet TAKE 1 TABLET BY MOUTH EVERY DAY 08/06/23  Yes Heilingoetter, Cassandra L, PA-C  Glycopyrrolate-Formoterol (BEVESPI AEROSPHERE) 9-4.8 MCG/ACT AERO Inhale 2 puffs into the lungs 2 (two) times daily. 08/20/23  Yes Bevelyn Ngo, NP  HYDROcodone bit-homatropine (HYCODAN) 5-1.5 MG/5ML syrup Take 5 mLs by mouth every 6 (six) hours as needed for cough. 08/20/23  Yes Bevelyn Ngo, NP  hyoscyamine (LEVSIN SL) 0.125 MG SL tablet Place 1-2  tablets (0.125-0.25mg ) under the tongue every 6 (six) hours as needed for cramping. 05/02/23  Yes Pickenpack-Cousar, Arty Baumgartner, NP  ipratropium-albuterol (DUONEB) 0.5-2.5 (3) MG/3ML SOLN Take 3 mLs by nebulization every 6 (six) hours as needed (Asthma). 07/06/23  Yes Pickenpack-Cousar, Arty Baumgartner, NP  isosorbide mononitrate (IMDUR) 30 MG 24 hr tablet Take 0.5 tablets (15 mg total) by mouth daily. 12/12/22  Yes Jodelle Gross, NP  latanoprost (XALATAN) 0.005 % ophthalmic solution Place 1 drop into both eyes at bedtime.  12/14/17  Yes [provider]  ondansetron (ZOFRAN-ODT) 4 MG disintegrating tablet Take 1 tablet (4 mg total) by mouth every 8 (eight) hours as needed for nausea or vomiting. Patient taking differently: Take 4 mg by mouth every 8 (eight) hours as needed for nausea or vomiting (dissolve orally). 07/07/21  Yes Heilingoetter, Cassandra L, PA-C  osimertinib mesylate (TAGRISSO) 80 MG tablet Take 1 tablet (80 mg total) by mouth daily. 09/20/22  Yes Si Gaul, MD  PEPCID AC 10 MG tablet Take 10 mg by mouth 2 (two) times daily.   Yes [provider]  predniSONE (DELTASONE) 10  MG tablet Take 1 tablet (10 mg total) by mouth daily with breakfast. 09/04/23  Yes Heilingoetter, Cassandra L, PA-C  prochlorperazine (COMPAZINE) 10 MG tablet Take 1 tablet (10 mg total) by mouth every 6 (six) hours as needed. Patient taking differently: Take 10 mg by mouth every 6 (six) hours as needed for nausea or vomiting. 08/23/23  Yes Heilingoetter, Cassandra L, PA-C  REPATHA SURECLICK 140 MG/ML SOAJ Inject 140 mg into the skin every 14 (fourteen) days. 06/01/23  Yes Jodelle Gross, NP  Wheat Dextrin (BENEFIBER DRINK MIX) PACK Take 1 packet by mouth daily.   Yes [provider]  benzonatate (TESSALON) 100 MG capsule Take 2 capsules (200 mg total) by mouth 3 (three) times daily as needed for cough. Patient not taking: Reported on 09/07/2023 07/16/23     CHELATED MAGNESIUM PO Take 200 mg  by mouth daily. Patient not taking: Reported on 09/07/2023    [provider]  Cholecalciferol (VITAMIN D) 125 MCG (5000 UT) CAPS Take 1 Capful by mouth daily. Patient not taking: Reported on 09/07/2023    [provider]  dexamethasone (DECADRON) 4 MG tablet Take 1 tablet twice a day the day before, day of, and day after treatment Patient not taking: Reported on 09/07/2023 08/23/23   Heilingoetter, Cassandra L, PA-C  doxycycline (VIBRAMYCIN) 100 MG capsule Take 1 capsule (100 mg total) by mouth 2 (two) times daily for 7 days. Patient not taking: Reported on 09/07/2023 07/16/23     levofloxacin (LEVAQUIN) 750 MG tablet Take 1 tablet (750 mg total) by mouth every other day. Patient not taking: Reported on 09/07/2023 08/02/23   Si Gaul, MD  nystatin (MYCOSTATIN) 100000 UNIT/ML suspension Take 5 mLs (500,000 Units total) by mouth 4 (four) times daily. Patient not taking: Reported on 09/07/2023 08/23/23   Heilingoetter, Cassandra L, PA-C  prochlorperazine (COMPAZINE) 10 MG tablet Take 1 tablet (10 mg total) by mouth every 6 (six) hours as needed. Patient not taking: Reported on 09/07/2023 07/20/22   Heilingoetter, Johnette Abraham, PA-C                                                                                                                                    Past Surgical History Past Surgical History:  Procedure Laterality Date   ABDOMINAL HYSTERECTOMY     partial   BRONCHIAL BIOPSY  04/21/2021   Procedure: BRONCHIAL BIOPSIES;  Surgeon: Josephine Igo, DO;  Location: [facility];  Service: Pulmonary;;   BRONCHIAL BRUSHINGS  04/21/2021   Procedure: BRONCHIAL BRUSHINGS;  Surgeon: Josephine Igo, DO;  Location: [facility];  Service: Pulmonary;;   BRONCHIAL NEEDLE ASPIRATION BIOPSY  04/21/2021   Procedure: BRONCHIAL NEEDLE ASPIRATION BIOPSIES;  Surgeon: Josephine Igo, DO;  Location: [facility];  Service: Pulmonary;;   CHEST TUBE INSERTION Right 01/01/2018   Procedure:  INSERTION PLEURAL DRAINAGE CATHETER;  Surgeon: Kerin Perna, MD;  Location: [facility] OR;  Service:  Thoracic;  Laterality: Right;   CHEST TUBE INSERTION  04/21/2021   Procedure: CHEST TUBE INSERTION;  Surgeon: Josephine Igo, DO;  Location: [facility];  Service: Pulmonary;;   COLONOSCOPY     CORONARY STENT INTERVENTION N/A 06/16/2019   Procedure: CORONARY STENT INTERVENTION;  Surgeon: Corky Crafts, MD;  Location: [facility];  Service: Cardiovascular;  Laterality: N/A;   DILATION AND CURETTAGE OF UTERUS     EYE SURGERY     due to Glaucoma   IR IMAGING GUIDED PORT INSERTION  03/22/2021   IR PORT REPAIR CENTRAL VENOUS ACCESS DEVICE  04/15/2021   LEFT HEART CATH AND CORONARY ANGIOGRAPHY N/A 06/16/2019   Procedure: LEFT HEART CATH AND CORONARY ANGIOGRAPHY;  Surgeon: Corky Crafts, MD;  Location: [facility] INVASIVE CV LAB;  Service: Cardiovascular;  Laterality: N/A;   REMOVAL OF PLEURAL DRAINAGE CATHETER Right 11/07/2018   Procedure: REMOVAL OF PLEURAL DRAINAGE CATHETER;  Surgeon: Kerin Perna, MD;  Location: [facility] OR;  Service: Thoracic;  Laterality: Right;   ROTATOR CUFF REPAIR     TUBAL LIGATION     VIDEO BRONCHOSCOPY WITH ENDOBRONCHIAL NAVIGATION Bilateral 04/21/2021   Procedure: VIDEO BRONCHOSCOPY WITH ENDOBRONCHIAL NAVIGATION;  Surgeon: Josephine Igo, DO;  Location: [facility];  Service: Pulmonary;  Laterality: Bilateral;  ION   WISDOM TOOTH EXTRACTION     Family History Family History  Problem Relation Age of Onset   Heart disease Sister    Heart disease Brother    Lung cancer Other     Social History Social History   Tobacco Use   Smoking status: Never   Smokeless tobacco: Never  Vaping Use   Vaping status: Never Used  Substance Use Topics   Alcohol use: Not Currently    Comment: up to 3 drinks per week   Drug use: No    Comment: CBD oil    Allergies Hydrocodone-acetaminophen, Other, Penicillins, Lactose, Vicodin [hydrocodone-acetaminophen], Bacid, and  Ipratropium-albuterol  Review of Systems Review of Systems  All other systems reviewed and are negative.   Physical Exam Vital Signs  I have reviewed the triage vital signs BP (!) 118/58 (BP Location: Left Arm)   Pulse 78   Temp 98.1 F (36.7 C)   Resp 16   Ht 5\' 4"  (1.626 m)   Wt 56.2 kg   LMP  (LMP Unknown)   SpO2 96%   BMI 21.28 kg/m  Physical Exam Vitals and nursing note reviewed.  Constitutional:      General: She is not in acute distress.    Appearance: She is well-developed.  HENT:     Head: Normocephalic and atraumatic.     Mouth/Throat:     Mouth: Mucous membranes are moist.  Eyes:     Pupils: Pupils are equal, round, and reactive to light.  Cardiovascular:     Rate and Rhythm: Normal rate and regular rhythm.     Heart sounds: No murmur heard. Pulmonary:     Effort: Pulmonary effort is normal. No respiratory distress.     Breath sounds: Normal breath sounds.  Abdominal:     General: Abdomen is flat.     Palpations: Abdomen is soft.     Tenderness: There is no abdominal tenderness.  Musculoskeletal:        General: No tenderness.     Right lower leg: No edema.     Left lower leg: No edema.  Skin:    General: Skin is warm and dry.  Neurological:  General: No focal deficit present.     Mental Status: She is alert. Mental status is at baseline.  Psychiatric:        Mood and Affect: Mood normal.        Behavior: Behavior normal.     ED Results and Treatments Labs (all labs ordered are listed, but only abnormal results are displayed) Labs Reviewed  COMPREHENSIVE METABOLIC PANEL - Abnormal; Notable for the following components:      Result Value   Glucose, Bld 110 (*)    Creatinine, Ser 1.09 (*)    Total Protein 6.0 (*)    Albumin 2.9 (*)    GFR, Estimated 54 (*)    All other components within normal limits  CBC WITH DIFFERENTIAL/PLATELET - Abnormal; Notable for the following components:   Hemoglobin 11.6 (*)    Platelets 125 (*)    Neutro  Abs 7.9 (*)    All other components within normal limits  CBC - Abnormal; Notable for the following components:   Hemoglobin 11.3 (*)    HCT 35.4 (*)    Platelets 119 (*)    All other components within normal limits  COMPREHENSIVE METABOLIC PANEL                                                                                                                          Radiology UE VENOUS DUPLEX (7am - 7pm) Result Date: 09/08/2023 UPPER VENOUS STUDY  Patient Name:  JAMIESHA VICTORIA Pickrell  Date of Exam:   09/07/2023 Medical Rec #: [id number]        Accession #:    [id number] Date of Birth: May 22, 1951        Patient Gender: F Patient Age:   58 years Exam Location:  [facility] Procedure:      VAS [place] UPPER EXTREMITY VENOUS DUPLEX Referring Phys: Alvino Blood --------------------------------------------------------------------------------  Indications: Swelling Risk Factors: History of PE (2022) Lung Cancer / Chemotherapy / Right side port placement. Anticoagulation: Lovenox. Comparison Study: No previous UE venous exams Performing Technologist: Jody Hill RVT, RDMS  Examination Guidelines: A complete evaluation includes B-mode imaging, spectral Doppler, color Doppler, and power Doppler as needed of all accessible portions of each vessel. Bilateral testing is considered an integral part of a complete examination. Limited examinations for reoccurring indications may be performed as noted.  Right Findings: +----------+------------+---------+-----------+----------+-----------------+ RIGHT     CompressiblePhasicitySpontaneousProperties     Summary      +----------+------------+---------+-----------+----------+-----------------+ IJV         Partial      Yes       Yes              Age Indeterminate +----------+------------+---------+-----------+----------+-----------------+ Subclavian    None       No        No                     Acute        +----------+------------+---------+-----------+----------+-----------------+ Axillary  None       No        No                     Acute       +----------+------------+---------+-----------+----------+-----------------+ Brachial      None       No        No                     Acute       +----------+------------+---------+-----------+----------+-----------------+ Radial        Full                                                    +----------+------------+---------+-----------+----------+-----------------+ Ulnar                                                patent by color  +----------+------------+---------+-----------+----------+-----------------+ Cephalic      None       No        No                     Acute       +----------+------------+---------+-----------+----------+-----------------+ Basilic       None       No        No                     Acute       +----------+------------+---------+-----------+----------+-----------------+  Left Findings: +----------+------------+---------+-----------+----------+-----------------+ LEFT      CompressiblePhasicitySpontaneousProperties     Summary      +----------+------------+---------+-----------+----------+-----------------+ IJV           None       No        No                     Acute       +----------+------------+---------+-----------+----------+-----------------+ Subclavian    None       No        No                     Acute       +----------+------------+---------+-----------+----------+-----------------+ Axillary      None       No        No               Age Indeterminate +----------+------------+---------+-----------+----------+-----------------+ Brachial      None       No        No                     Acute       +----------+------------+---------+-----------+----------+-----------------+ Radial        Full                                                     +----------+------------+---------+-----------+----------+-----------------+ Ulnar         Full                                                    +----------+------------+---------+-----------+----------+-----------------+  Cephalic      Full                                                    +----------+------------+---------+-----------+----------+-----------------+ Basilic       None       No        No               Age Indeterminate +----------+------------+---------+-----------+----------+-----------------+  Summary:  Right: Findings consistent with acute deep vein thrombosis involving the right subclavian vein, right axillary vein and right brachial veins. Findings consistent with acute superficial vein thrombosis involving the right basilic vein and right cephalic vein. Findings consistent with age indeterminate deep vein thrombosis involving the right internal jugular vein.  Left: Findings consistent with acute deep vein thrombosis involving the left internal jugular vein, left subclavian vein and left brachial veins. Findings consistent with age indeterminate deep vein thrombosis involving the left axillary vein. Findings consistent with age indeterminate superficial vein thrombosis involving the left basilic vein.  *See table(s) above for measurements and observations.  Diagnosing physician: Lemar Livings MD Electronically signed by Lemar Livings MD on 09/08/2023 at 9:38:23 AM.    Final    CT Chest Wo Contrast Result Date: 09/07/2023 CLINICAL DATA:  History of right lung cancer presenting with chest pain after choking episode on cough syrup associated with shortness of breath. * Tracking Code: BO * EXAM: CT CHEST WITHOUT CONTRAST TECHNIQUE: Multidetector CT imaging of the chest was performed following the standard protocol without IV contrast. RADIATION DOSE REDUCTION: This exam was performed according to the departmental dose-optimization program which includes automated exposure  control, adjustment of the mA and/or kV according to patient size and/or use of iterative reconstruction technique. COMPARISON:  Nuclear medicine PET dated 08/13/2023, CT chest dated 07/25/2023 FINDINGS: Cardiovascular: Right chest wall port terminates at the superior cavoatrial junction. Normal heart size. No significant pericardial fluid/thickening. Great vessels are normal in course and caliber. Coronary artery calcifications and aortic atherosclerosis. Mediastinum/Nodes: Imaged thyroid gland without nodules meeting criteria for imaging follow-up by size. Normal esophagus. Unchanged 12 mm subcarinal lymph node (2:25) Lungs/Pleura: The central airways are patent. Similar asymmetric narrowing of the right bronchi and bronchioles secondary to encasement by the peribronchovascular consolidation, which has increased compared to 08/13/2023. Interval increased nodular interlobular septal thickening throughout the right lung. New mild interlobular septal thickening of the left lung. New 1.4 x 1.4 cm left lower lobe nodule (4:119) contains internal hypodensities. New 8 mm lateral left apical nodule (4:22) and scattered ground-glass nodules, predominantly within the left upper lobe. No pneumothorax. Increased small right and moderate left pleural effusions. Upper abdomen: Normal. Musculoskeletal: Age indeterminate superior endplate compression deformity of T7, new compared to 07/25/2023. IMPRESSION: 1. Increased peribronchovascular consolidation throughout the right lung with increased nodular interlobular septal thickening throughout the right lung, suspicious for progression of disease. 2. New 1.4 cm left lower lobe nodule contains internal hypodensities, indeterminate which may represent metastatic disease or a focus of lipoid pneumonia given reported aspiration. 3. New 8 mm lateral left apical nodule and scattered ground-glass nodules, predominantly within the left upper lobe, which may be infectious/inflammatory or  metastatic. 4. Increased small right and moderate left pleural effusions. 5. Age indeterminate superior endplate compression deformity of T7, new compared to 07/25/2023. Aortic Atherosclerosis (ICD10-I70.0). Coronary artery calcifications. Assessment for potential risk  factor modification, dietary therapy or pharmacologic therapy may be warranted, if clinically indicated. Electronically Signed   By: Agustin Cree M.D.   On: 09/07/2023 14:24   DG Chest Port 1 View Result Date: 09/07/2023 CLINICAL DATA:  Cough. EXAM: PORTABLE CHEST 1 VIEW COMPARISON:  08/06/2023. FINDINGS: Since the prior study, there is increasing opacification of right hemithorax with shift of mediastinal structures to the right. There is at least moderate layering right pleural effusion with associated compressive atelectatic changes in the right lung. Left lung and left lateral costophrenic angle are clear. No pneumothorax on either side. Evaluation of cardiomediastinal silhouette is nondiagnostic due to right lower hemithorax opacification. No acute osseous abnormalities. The soft tissues are within normal limits. Right-sided CT Port-A-Cath is seen with its tip overlying the midportion of superior vena cava, unchanged. IMPRESSION: Increasing opacification of right hemithorax with shift of mediastinal structures to the right. There is at least moderate layering right pleural effusion with associated compressive atelectatic changes in the right lung. Electronically Signed   By: Jules Schick M.D.   On: 09/07/2023 11:48    Pertinent labs & imaging results that were available during my care of the patient were reviewed by me and considered in my medical decision making (see MDM for details).  Medications Ordered in ED Medications  iohexol (OMNIPAQUE) 350 MG/ML injection 75 mL (has no administration in time range)  acetaminophen (TYLENOL) tablet 1,000 mg (has no administration in time range)  isosorbide mononitrate (IMDUR) 24 hr tablet 15 mg  (15 mg Oral Given 09/08/23 0946)  ALPRAZolam (XANAX) tablet 0.25 mg (0.25 mg Oral Given 09/07/23 2200)  ipratropium-albuterol (DUONEB) 0.5-2.5 (3) MG/3ML nebulizer solution 3 mL (has no administration in time range)  FLUoxetine (PROZAC) capsule 20 mg (20 mg Oral Given 09/08/23 0946)  ezetimibe (ZETIA) tablet 10 mg (10 mg Oral Given 09/08/23 0946)  carvedilol (COREG) tablet 12.5 mg (12.5 mg Oral Given 09/08/23 0837)  predniSONE (DELTASONE) tablet 10 mg (10 mg Oral Given 09/08/23 0837)  benzonatate (TESSALON) capsule 200 mg (200 mg Oral Patient Refused/Not Given 09/08/23 0949)  HYDROcodone bit-homatropine (HYCODAN) 5-1.5 MG/5ML syrup 5 mL (has no administration in time range)  docusate sodium (COLACE) capsule 100 mg (100 mg Oral Given 09/08/23 0946)  nystatin (MYCOSTATIN) 100000 UNIT/ML suspension 500,000 Units (500,000 Units Oral Patient Refused/Not Given 09/08/23 0949)  dorzolamide-timolol (COSOPT) 2-0.5 % ophthalmic solution 1 drop (1 drop Both Eyes Given 09/08/23 0948)  hyoscyamine (LEVSIN SL) SL tablet 0.125-0.25 mg (has no administration in time range)  cholecalciferol (VITAMIN D3) 25 MCG (1000 UNIT) tablet 5,000 Units (5,000 Units Oral Given 09/08/23 0946)  fentaNYL (SUBLIMAZE) injection 12.5-50 mcg (50 mcg Intravenous Given 09/08/23 0834)  ondansetron (ZOFRAN) tablet 4 mg (has no administration in time range)    Or  ondansetron (ZOFRAN) injection 4 mg (has no administration in time range)  enoxaparin (LOVENOX) injection 55 mg (55 mg Subcutaneous Given 09/08/23 0611)  sodium chloride flush (NS) 0.9 % injection 10-40 mL (10 mLs Intracatheter Given 09/08/23 0917)  sodium chloride flush (NS) 0.9 % injection 10-40 mL (has no administration in time range)  Chlorhexidine Gluconate Cloth 2 % PADS 6 each (6 each Topical Given 09/08/23 0917)  sodium chloride 0.9 % bolus 1,000 mL (0 mLs Intravenous Stopped 09/07/23 1553)  fentaNYL (SUBLIMAZE) injection 50 mcg (50 mcg Intravenous Given 09/07/23 1749)  Procedures Procedures  (including critical care time)  Medical Decision Making / ED Course   MDM:  73 year old presenting to the emergency department with cough.  Patient is overall well-appearing, physical examination with clear lungs, some coughing on exam but no respiratory distress.  Vitals reassuring with normal oxygen saturation, no tachypnea.  Suspect patient may have had a mild aspiration event, given mild chest pain, will check chest x-ray to evaluate for process such as pneumothorax, pneumonia, but given her symptoms are significantly improved, vitals are reassuring, this is reassuring anticipate discharge for her oncology visit today.  Do not think labs are necessary given that this was an acute event with specific trigger.  Medication was liquid, low concern for solid object or pill aspiration.  Clinical Course as of 09/08/23 1035  Fri Sep 07, 2023  1153 DG Chest [place] 1 View [WS]  [phone] Chest x-ray shows worsening right sided opacity from prior.  Patient has had previous Pleurx catheter although this has been removed.  Obtain CT scan to evaluate for potential pleural effusion as a cause but appears to be worsening of malignancy.  Patient also complained of left arm swelling, does have some slight edema to the left arm so we will obtain DVT ultrasound.  Given patient missed her appointment, repeated labs today which show improvement in recent AKI, will give additional fluids.  Discussed with on-call oncologist Dr. Pamelia Hoit who will discuss with patient's primary oncologist Dr. [name] who is off today.  If DVT ultrasound is negative, anticipate discharge. [WS]  1628 Signe d [WS]  1628 Signed out to Dr. Theresia Lo pending DVT result  [WS]    Clinical Course User Index [WS] Lonell Grandchild, MD     Additional history obtained: -Additional history obtained from  ems -External records from outside source obtained and reviewed including: Chart review including previous notes, labs, imaging, consultation notes including prior notes    Lab Tests: -I ordered, reviewed, and interpreted labs.   The pertinent results include:   Labs Reviewed  COMPREHENSIVE METABOLIC PANEL - Abnormal; Notable for the following components:      Result Value   Glucose, Bld 110 (*)    Creatinine, Ser 1.09 (*)    Total Protein 6.0 (*)    Albumin 2.9 (*)    GFR, Estimated 54 (*)    All other components within normal limits  CBC WITH DIFFERENTIAL/PLATELET - Abnormal; Notable for the following components:   Hemoglobin 11.6 (*)    Platelets 125 (*)    Neutro Abs 7.9 (*)    All other components within normal limits  CBC - Abnormal; Notable for the following components:   Hemoglobin 11.3 (*)    HCT 35.4 (*)    Platelets 119 (*)    All other components within normal limits  COMPREHENSIVE METABOLIC PANEL    Notable for mild anemia  EKG   EKG Interpretation Date/Time:  Friday September 07 2023 17:54:45 EDT Ventricular Rate:  72 PR Interval:  126 QRS Duration:  78 QT Interval:  405 QTC Calculation: 444 R Axis:   42  Text Interpretation: Sinus rhythm Low voltage, precordial leads No significant change since prior 6/24 Confirmed by Meridee Score [phone]) on 09/08/2023 10:32:41 AM         Imaging Studies ordered: I ordered imaging studies including CXR On my interpretation imaging demonstrates r opacity I independently visualized and interpreted imaging. I agree with the radiologist interpretation   Medicines ordered and prescription drug management: Meds ordered this encounter  Medications   sodium chloride 0.9 % bolus 1,000 mL   iohexol (OMNIPAQUE) 350 MG/ML injection 75 mL   fentaNYL (SUBLIMAZE) injection 50 mcg   DISCONTD: heparin ADULT infusion 100 units/mL (25000 units/224mL)   acetaminophen (TYLENOL) tablet 1,000 mg   isosorbide mononitrate (IMDUR) 24  hr tablet 15 mg   ALPRAZolam (XANAX) tablet 0.25 mg    May take 2 tablets (0.5 mg total) at bedtime.     ipratropium-albuterol (DUONEB) 0.5-2.5 (3) MG/3ML nebulizer solution 3 mL   FLUoxetine (PROZAC) capsule 20 mg   ezetimibe (ZETIA) tablet 10 mg   DISCONTD: osimertinib mesylate (TAGRISSO) tablet 80 mg   carvedilol (COREG) tablet 12.5 mg   predniSONE (DELTASONE) tablet 10 mg   benzonatate (TESSALON) capsule 200 mg   HYDROcodone bit-homatropine (HYCODAN) 5-1.5 MG/5ML syrup 5 mL   docusate sodium (COLACE) capsule 100 mg   nystatin (MYCOSTATIN) 100000 UNIT/ML suspension 500,000 Units   dorzolamide-timolol (COSOPT) 2-0.5 % ophthalmic solution 1 drop   hyoscyamine (LEVSIN SL) SL tablet 0.125-0.25 mg   cholecalciferol (VITAMIN D3) 25 MCG (1000 UNIT) tablet 5,000 Units   fentaNYL (SUBLIMAZE) injection 12.5-50 mcg   OR Linked Order Group    ondansetron (ZOFRAN) tablet 4 mg    ondansetron (ZOFRAN) injection 4 mg   enoxaparin (LOVENOX) injection 55 mg   sodium chloride flush (NS) 0.9 % injection 10-40 mL   sodium chloride flush (NS) 0.9 % injection 10-40 mL   Chlorhexidine Gluconate Cloth 2 % PADS 6 each    -I have reviewed the patients home medicines and have made adjustments as needed     Reevaluation: After the interventions noted above, I reevaluated the patient and found that their symptoms have improved  Co morbidities that complicate the patient evaluation  Past Medical History:  Diagnosis Date   Anemia    Anxiety    Arthritis    Asthma    exercise induced   Coronary artery disease    Depression    PMH   Dyspnea    GERD (gastroesophageal reflux disease)    Glaucoma    History of radiation therapy 01/05/2021   IMRT right lung  11/24/2020-01/05/2021  Dr Antony Blackbird   Hypertension    lung ca dx'd 11/2017   right   Malignant pleural effusion    right   Nuclear sclerotic cataract of right eye 02/28/2021   Dr. Sallye Lat, cataract surgery February 2023   PONV  (postoperative nausea and vomiting)    Pre-diabetes    Raynaud's disease    Raynaud's disease    Stroke (HCC) 01/2021   balance off, some express aphasia, weakness      Dispostion: Disposition decision including need for hospitalization was considered, and patient disposition pending at time of sign out.    Final Clinical Impression(s) / ED Diagnoses Final diagnoses:  Acute deep vein thrombosis (DVT) of brachial vein of both upper extremities (HCC)     This chart was dictated using voice recognition software.  Despite best efforts to proofread,  errors can occur which can change the documentation meaning.    Lonell Grandchild, MD 09/08/23 [phone]

## 2023-09-07 NOTE — H&P (Signed)
 History and Physical    [name]:[id number] DOB: December 23, 1950 DOA: 09/07/2023  PCP: [name], MD  Patient coming from: Home.  Lives at home with her daughter.  I have personally briefly reviewed patient's old medical records available.   Chief Complaint: Feeling weak, shortness of breath, left arm pain and anterior chest pain.  HPI: Allison [name] is a 73 y.o. female with medical history significant of stage IV lung cancer treated with chemoradiation since 2019, now with recurrent lung cancer, metastatic lymphadenopathy and malignant right-sided pleural effusion, multiple DVTs and history of stroke currently on Lovenox at home 80 mg daily presented to the emergency room with coughing after taking medications, swelling of the left arm, feeling swelling of the left anterior chest wall, shortness of breath and just not feeling well.  Patient stated that she was recently found to have recurrent cancer and is planning to start chemotherapy, however this was postponed because of patient not looking good.  She was supposed to have seen at infusion center today to give IV fluids with plan to start chemotherapy next week.  Patient has chronic loss of appetite.  She does have chronic shortness of breath but it is worse now.  Arm swelling has been recently noted.  Patient without any fever.  She has dry cough and uses Hycodan syrup.  Does not have any lower extremity swelling.  Pain is mostly on the anterior chest wall sometimes worse with deep breathing. ED Course: On room air.  Afebrile.  WBC count and electrolytes are adequate.  She had noncontrasted CT scan of the chest that shows extensive cancer progression, bilateral pleural effusion.  Upper extremity ultrasound bilateral showed Acute DVT right subclavian, right axillary and right brachial veins, age-indeterminate DVT right internal jugular On the left side she has DVT on the internal jugular subclavian and brachial veins. On further  questioning, patient stated that she has not been taking Lovenox consistently because of not feeling well.  Patient might have taken 10 to 12 injections of Lovenox in last 1 month. Patient was given pain medications.  She was started on a heparin ER as this was thought to be Lovenox failure however this is likely due to aggressive lung cancer with thromboembolism and inconsistent Lovenox use. After discussion with patient, started on twice daily dose of Lovenox.  Thoracentesis requested due to persistent symptoms.  Admitted to the hospital.  Will need more palliative discussions.   Review of Systems: all systems are reviewed and pertinent positive as per HPI otherwise rest are negative.    Past Medical History:  Diagnosis Date   Anemia    Anxiety    Arthritis    Asthma    exercise induced   Coronary artery disease    Depression    PMH   Dyspnea    GERD (gastroesophageal reflux disease)    Glaucoma    History of radiation therapy 01/05/2021   IMRT right lung  11/24/2020-01/05/2021  Dr Antony Blackbird   Hypertension    lung ca dx'd 11/2017   right   Malignant pleural effusion    right   Nuclear sclerotic cataract of right eye 02/28/2021   Dr. Sallye Lat, cataract surgery February 2023   PONV (postoperative nausea and vomiting)    Pre-diabetes    Raynaud's disease    Raynaud's disease    Stroke (HCC) 01/2021   balance off, some express aphasia, weakness    Past Surgical History:  Procedure Laterality Date   ABDOMINAL  HYSTERECTOMY     partial   BRONCHIAL BIOPSY  04/21/2021   Procedure: BRONCHIAL BIOPSIES;  Surgeon: Josephine Igo, DO;  Location: [facility];  Service: Pulmonary;;   BRONCHIAL BRUSHINGS  04/21/2021   Procedure: BRONCHIAL BRUSHINGS;  Surgeon: Josephine Igo, DO;  Location: [facility];  Service: Pulmonary;;   BRONCHIAL NEEDLE ASPIRATION BIOPSY  04/21/2021   Procedure: BRONCHIAL NEEDLE ASPIRATION BIOPSIES;  Surgeon: Josephine Igo, DO;  Location: [facility]  ENDOSCOPY;  Service: Pulmonary;;   CHEST TUBE INSERTION Right 01/01/2018   Procedure: INSERTION PLEURAL DRAINAGE CATHETER;  Surgeon: Kerin Perna, MD;  Location: [facility] OR;  Service: Thoracic;  Laterality: Right;   CHEST TUBE INSERTION  04/21/2021   Procedure: CHEST TUBE INSERTION;  Surgeon: Josephine Igo, DO;  Location: [facility]OS[facility];  Service: Pulmonary;;   COLONOSCOPY     CORONARY STENT INTERVENTION N/A 06/16/2019   Procedure: CORONARY STENT INTERVENTION;  Surgeon: Corky Crafts, MD;  Location: [facility] INVASIVE CV LAB;  Service: Cardiovascular;  Laterality: N/A;   DILATION AND CURETTAGE OF UTERUS     EYE SURGERY     due to Glaucoma   IR IMAGING GUIDED PORT INSERTION  03/22/2021   IR PORT REPAIR CENTRAL VENOUS ACCESS DEVICE  04/15/2021   LEFT HEART CATH AND CORONARY ANGIOGRAPHY N/A 06/16/2019   Procedure: LEFT HEART CATH AND CORONARY ANGIOGRAPHY;  Surgeon: Corky Crafts, MD;  Location: [facility] INVASIVE CV LAB;  Service: Cardiovascular;  Laterality: N/A;   REMOVAL OF PLEURAL DRAINAGE CATHETER Right 11/07/2018   Procedure: REMOVAL OF PLEURAL DRAINAGE CATHETER;  Surgeon: Kerin Perna, MD;  Location: [facility] OR;  Service: Thoracic;  Laterality: Right;   ROTATOR CUFF REPAIR     TUBAL LIGATION     VIDEO BRONCHOSCOPY WITH ENDOBRONCHIAL NAVIGATION Bilateral 04/21/2021   Procedure: VIDEO BRONCHOSCOPY WITH ENDOBRONCHIAL NAVIGATION;  Surgeon: Josephine Igo, DO;  Location: [facility];  Service: Pulmonary;  Laterality: Bilateral;  ION   WISDOM TOOTH EXTRACTION      Social history   reports that she has never smoked. She has never used smokeless tobacco. She reports that she does not currently use alcohol. She reports that she does not use drugs.  Allergies  Allergen Reactions   Hydrocodone-Acetaminophen Other (See Comments)   Penicillins Other (See Comments)    SYNCOPE PATIENT HAS HAD A PCN REACTION WITH IMMEDIATE RASH, FACIAL/TONGUE/THROAT SWELLING, SOB, OR LIGHTHEADEDNESS WITH  HYPOTENSION:  #  #  YES  #  # Has patient had a PCN reaction causing severe rash involving mucus membranes or skin necrosis: No Has patient had a PCN reaction that required hospitalization: No Has patient had a PCN reaction occurring within the last 10 years: No  Other reaction(s): other   Lactose Other (See Comments)    Allergy to all dairy products Abdominal cramping   Vicodin [Hydrocodone-Acetaminophen] Other (See Comments)    Sped up heart and breathing   Bacid Nausea And Vomiting   Other Other (See Comments)    Glaucoma eye drop - turned eyes dark    Family History  Problem Relation Age of Onset   Heart disease Sister    Heart disease Brother    Lung cancer Other      Prior to Admission medications   Medication Sig Start Date End Date Taking? Authorizing Provider  acetaminophen (TYLENOL) 500 MG tablet Take 1,000 mg by mouth every 6 (six) hours as needed (pain).    [provider]  ALPRAZolam Prudy Feeler) 0.25 MG tablet Take 1  tablet (0.25 mg total) by mouth 2 (two) times daily as needed for anxiety. May take 2 tablets (0.5 mg total) at bedtime. 11/23/22   Pickenpack-Cousar, Arty Baumgartner, NP  amLODipine (NORVASC) 5 MG tablet Take 1 tablet (5 mg total) by mouth daily. 05/28/23   Jodelle Gross, NP  benzonatate (TESSALON) 100 MG capsule Take 2 capsules (200 mg total) by mouth 3 (three) times daily as needed for cough. 07/16/23     carvedilol (COREG) 12.5 MG tablet Take 1 tablet (12.5 mg total) by mouth 2 (two) times daily with a meal. 05/28/23   Jodelle Gross, NP  CHELATED MAGNESIUM PO Take 200 mg by mouth daily.    [provider]  Cholecalciferol (VITAMIN D) 125 MCG (5000 UT) CAPS Take 1 Capful by mouth daily.    [provider]  dexamethasone (DECADRON) 4 MG tablet Take 1 tablet twice a day the day before, day of, and day after treatment 08/23/23   Heilingoetter, Cassandra L, PA-C  docusate sodium (COLACE) 100 MG capsule Take 1 capsule (100 mg total) by  mouth 2 (two) times daily. 09/02/22   Pickenpack-Cousar, Arty Baumgartner, NP  dorzolamide-timolol (COSOPT) 22.3-6.8 MG/ML ophthalmic solution Place 1 drop into both eyes 2 (two) times daily.    [provider]  doxycycline (VIBRAMYCIN) 100 MG capsule Take 1 capsule (100 mg total) by mouth 2 (two) times daily for 7 days. 07/16/23     enoxaparin (LOVENOX) 80 MG/0.8ML injection Inject 80 mg into the skin daily.    [provider]  ezetimibe (ZETIA) 10 MG tablet Take 10 mg by mouth daily. 02/25/22   [provider]  FeFum-FePoly-FA-B Cmp-C-Biot (FOLIVANE-PLUS) CAPS Take 1 capsule by mouth in the morning. 07/21/22   Heilingoetter, Cassandra L, PA-C  FLUoxetine (PROZAC) 20 MG capsule Take 1 capsule (20 mg total) by mouth daily. 12/06/21   Reed, Tiffany L, DO  folic acid (FOLVITE) 1 MG tablet TAKE 1 TABLET BY MOUTH EVERY DAY 08/06/23   Heilingoetter, Cassandra L, PA-C  Glycopyrrolate-Formoterol (BEVESPI AEROSPHERE) 9-4.8 MCG/ACT AERO Inhale 2 puffs into the lungs 2 (two) times daily. 08/20/23   Bevelyn Ngo, NP  HYDROcodone bit-homatropine (HYCODAN) 5-1.5 MG/5ML syrup Take 5 mLs by mouth every 6 (six) hours as needed for cough. 08/20/23   Bevelyn Ngo, NP  hyoscyamine (LEVSIN SL) 0.125 MG SL tablet Place 1-2 tablets (0.125-0.25mg ) under the tongue every 6 (six) hours as needed for cramping. 05/02/23   Pickenpack-Cousar, Arty Baumgartner, NP  ipratropium-albuterol (DUONEB) 0.5-2.5 (3) MG/3ML SOLN Take 3 mLs by nebulization every 6 (six) hours as needed (Asthma). 07/06/23   Pickenpack-Cousar, Arty Baumgartner, NP  isosorbide mononitrate (IMDUR) 30 MG 24 hr tablet Take 0.5 tablets (15 mg total) by mouth daily. 12/12/22   Jodelle Gross, NP  latanoprost (XALATAN) 0.005 % ophthalmic solution Place 1 drop into both eyes at bedtime.  12/14/17   [provider]  levofloxacin (LEVAQUIN) 750 MG tablet Take 1 tablet (750 mg total) by mouth every other day. 08/02/23   Si Gaul, MD  nystatin (MYCOSTATIN)  100000 UNIT/ML suspension Take 5 mLs (500,000 Units total) by mouth 4 (four) times daily. 08/23/23   Heilingoetter, Cassandra L, PA-C  ondansetron (ZOFRAN-ODT) 4 MG disintegrating tablet Take 1 tablet (4 mg total) by mouth every 8 (eight) hours as needed for nausea or vomiting. 07/07/21   Heilingoetter, Cassandra L, PA-C  osimertinib mesylate (TAGRISSO) 80 MG tablet Take 1 tablet (80 mg total) by mouth daily. 09/20/22  Si Gaul, MD  predniSONE (DELTASONE) 10 MG tablet Take 1 tablet (10 mg total) by mouth daily with breakfast. 09/04/23   Heilingoetter, Cassandra L, PA-C  prochlorperazine (COMPAZINE) 10 MG tablet Take 1 tablet (10 mg total) by mouth every 6 (six) hours as needed. 07/20/22   Heilingoetter, Cassandra L, PA-C  prochlorperazine (COMPAZINE) 10 MG tablet Take 1 tablet (10 mg total) by mouth every 6 (six) hours as needed. 08/23/23   Heilingoetter, Cassandra L, PA-C  REPATHA SURECLICK 140 MG/ML SOAJ Inject 140 mg into the skin every 14 (fourteen) days. 06/01/23   Jodelle Gross, NP    Physical Exam: Vitals:   09/07/23 1019 09/07/23 1027 09/07/23 1440 09/07/23 1503  BP: (!) 153/80 (!) 153/80 (!) 153/73   Pulse: 72 68 60   Resp: 14 12 20    Temp: (!) 97.4 F (36.3 C) (!) 97.4 F (36.3 C)  97.9 F (36.6 C)  TempSrc: Oral Oral  Oral  SpO2: 95% 95% 94%   Weight:  56.2 kg    Height:  5\' 4"  (1.626 m)      Constitutional: Chronically sick looking.  Patient able to keep up conversation but appropriately anxious and for moderately distressed due to primary on her arms and chest.   Vitals:   09/07/23 1019 09/07/23 1027 09/07/23 1440 09/07/23 1503  BP: (!) 153/80 (!) 153/80 (!) 153/73   Pulse: 72 68 60   Resp: 14 12 20    Temp: (!) 97.4 F (36.3 C) (!) 97.4 F (36.3 C)  97.9 F (36.6 C)  TempSrc: Oral Oral  Oral  SpO2: 95% 95% 94%   Weight:  56.2 kg    Height:  5\' 4"  (1.626 m)     Eyes: PERRL, lids and conjunctivae normal ENMT: Mucous membranes are moist. Posterior pharynx clear  of any exudate or lesions.Normal dentition.  Neck: normal, supple, no masses, no thyromegaly Respiratory: Poor bilateral air entry.  Decreased respiratory effort. Using accessory muscles and endorsed JVD. Cardiovascular: Regular rate and rhythm, no murmurs / rubs / gallops.  2+ pedal pulses. No carotid bruits.  Port-A-Cath right chest wall. Abdomen: no tenderness, no masses palpated. No hepatosplenomegaly. Bowel sounds positive.  Musculoskeletal: no clubbing / cyanosis. No joint deformity upper and lower extremities. Good ROM, no contractures. Normal muscle tone.  Skin: no rashes, lesions, ulcers. No induration Neurologic: CN 2-12 grossly intact. Sensation intact, DTR normal. Strength 5/5 in all 4.  Psychiatric: Normal judgment and insight. Alert and oriented x 3.  Anxious.  Left arm 1+ edema, diffusely tender without erythema.  Distal pulses present.  Right foot.  No laceration    Labs on Admission: I have personally reviewed following labs and imaging studies  CBC: Recent Labs  Lab 09/04/23 1107 09/07/23 1302  WBC 11.9* 9.5  NEUTROABS 10.2* 7.9*  HGB 12.5 11.6*  HCT 38.5 37.6  MCV 80.7 84.7  PLT 154 125*   Basic Metabolic Panel: Recent Labs  Lab 09/04/23 1107 09/07/23 1302  NA 133* 137  K 4.1 3.6  CL 100 105  CO2 24 23  GLUCOSE 141* 110*  BUN 28* 15  CREATININE 1.65* 1.09*  CALCIUM 9.4 9.0   GFR: Estimated Creatinine Clearance: 40.3 mL/min (A) (by C-G formula based on SCr of 1.09 mg/dL (H)). Liver Function Tests: Recent Labs  Lab 09/04/23 1107 09/07/23 1302  AST 22 21  ALT 14 18  ALKPHOS 72 59  BILITOT 0.3 0.5  PROT 6.8 6.0*  ALBUMIN 3.6 2.9*   No results  for input(s): "LIPASE", "AMYLASE" in the last 168 hours. No results for input(s): "AMMONIA" in the last 168 hours. Coagulation Profile: No results for input(s): "INR", "PROTIME" in the last 168 hours. Cardiac Enzymes: No results for input(s): "CKTOTAL", "CKMB", "CKMBINDEX", "TROPONINI" in the last 168  hours. BNP (last 3 results) No results for input(s): "PROBNP" in the last 8760 hours. HbA1C: No results for input(s): "HGBA1C" in the last 72 hours. CBG: No results for input(s): "GLUCAP" in the last 168 hours. Lipid Profile: No results for input(s): "CHOL", "HDL", "LDLCALC", "TRIG", "CHOLHDL", "LDLDIRECT" in the last 72 hours. Thyroid Function Tests: No results for input(s): "TSH", "T4TOTAL", "FREET4", "T3FREE", "THYROIDAB" in the last 72 hours. Anemia Panel: No results for input(s): "VITAMINB12", "FOLATE", "FERRITIN", "TIBC", "IRON", "RETICCTPCT" in the last 72 hours. Urine analysis:    Component Value Date/Time   COLORURINE STRAW (A) 02/07/2021 2050   APPEARANCEUR CLEAR 02/07/2021 2050   LABSPEC 1.005 02/07/2021 2050   PHURINE 6.0 02/07/2021 2050   GLUCOSEU NEGATIVE 02/07/2021 2050   HGBUR MODERATE (A) 02/07/2021 2050   BILIRUBINUR NEGATIVE 02/07/2021 2050   KETONESUR NEGATIVE 02/07/2021 2050   PROTEINUR NEGATIVE 02/07/2021 2050   NITRITE NEGATIVE 02/07/2021 2050   LEUKOCYTESUR NEGATIVE 02/07/2021 2050    Radiological Exams on Admission: UE VENOUS DUPLEX (7am - 7pm) Result Date: 09/07/2023 UPPER VENOUS STUDY  Patient Name:  JAZSMINE MACARI Heiner  Date of Exam:   09/07/2023 Medical Rec #: [id number]        Accession #:    [id number] Date of Birth: 11-Feb-1951        Patient Gender: F Patient Age:   1 years Exam Location:  [facility] Procedure:      VAS [place] UPPER EXTREMITY VENOUS DUPLEX Referring Phys: Alvino Blood --------------------------------------------------------------------------------  Indications: Swelling Risk Factors: History of PE (2022) Lung Cancer / Chemotherapy / Right side port placement. Comparison Study: No previous UE venous exams Performing Technologist: Jody Hill RVT, RDMS  Examination Guidelines: A complete evaluation includes B-mode imaging, spectral Doppler, color Doppler, and power Doppler as needed of all accessible portions of each vessel. Bilateral  testing is considered an integral part of a complete examination. Limited examinations for reoccurring indications may be performed as noted.  Right Findings: +----------+------------+---------+-----------+----------+-----------------+ RIGHT     CompressiblePhasicitySpontaneousProperties     Summary      +----------+------------+---------+-----------+----------+-----------------+ IJV         Partial      Yes       Yes              Age Indeterminate +----------+------------+---------+-----------+----------+-----------------+ Subclavian    None       No        No                     Acute       +----------+------------+---------+-----------+----------+-----------------+ Axillary      None       No        No                     Acute       +----------+------------+---------+-----------+----------+-----------------+ Brachial      None       No        No                     Acute       +----------+------------+---------+-----------+----------+-----------------+ Radial        Full                                                    +----------+------------+---------+-----------+----------+-----------------+  Ulnar                                                patent by color  +----------+------------+---------+-----------+----------+-----------------+ Cephalic      None       No        No                     Acute       +----------+------------+---------+-----------+----------+-----------------+ Basilic       None       No        No                     Acute       +----------+------------+---------+-----------+----------+-----------------+  Left Findings: +----------+------------+---------+-----------+----------+-----------------+ LEFT      CompressiblePhasicitySpontaneousProperties     Summary      +----------+------------+---------+-----------+----------+-----------------+ IJV           None       No        No                     Acute        +----------+------------+---------+-----------+----------+-----------------+ Subclavian    None       No        No                     Acute       +----------+------------+---------+-----------+----------+-----------------+ Axillary      None       No        No               Age Indeterminate +----------+------------+---------+-----------+----------+-----------------+ Brachial      None       No        No                     Acute       +----------+------------+---------+-----------+----------+-----------------+ Radial        Full                                                    +----------+------------+---------+-----------+----------+-----------------+ Ulnar         Full                                                    +----------+------------+---------+-----------+----------+-----------------+ Cephalic      Full                                                    +----------+------------+---------+-----------+----------+-----------------+ Basilic       None       No        No               Age Indeterminate +----------+------------+---------+-----------+----------+-----------------+  Summary:  Right: Findings consistent with acute deep  vein thrombosis involving the right subclavian vein, right axillary vein and right brachial veins. Findings consistent with acute superficial vein thrombosis involving the right basilic vein and right cephalic vein. Findings consistent with age indeterminate deep vein thrombosis involving the right internal jugular vein.  Left: Findings consistent with acute deep vein thrombosis involving the left internal jugular vein, left subclavian vein and left brachial veins. Findings consistent with age indeterminate deep vein thrombosis involving the left axillary vein. Findings consistent with age indeterminate superficial vein thrombosis involving the left basilic vein.  *See table(s) above for measurements and observations.     Preliminary    CT Chest Wo Contrast Result Date: 09/07/2023 CLINICAL DATA:  History of right lung cancer presenting with chest pain after choking episode on cough syrup associated with shortness of breath. * Tracking Code: BO * EXAM: CT CHEST WITHOUT CONTRAST TECHNIQUE: Multidetector CT imaging of the chest was performed following the standard protocol without IV contrast. RADIATION DOSE REDUCTION: This exam was performed according to the departmental dose-optimization program which includes automated exposure control, adjustment of the mA and/or kV according to patient size and/or use of iterative reconstruction technique. COMPARISON:  Nuclear medicine PET dated 08/13/2023, CT chest dated 07/25/2023 FINDINGS: Cardiovascular: Right chest wall port terminates at the superior cavoatrial junction. Normal heart size. No significant pericardial fluid/thickening. Great vessels are normal in course and caliber. Coronary artery calcifications and aortic atherosclerosis. Mediastinum/Nodes: Imaged thyroid gland without nodules meeting criteria for imaging follow-up by size. Normal esophagus. Unchanged 12 mm subcarinal lymph node (2:25) Lungs/Pleura: The central airways are patent. Similar asymmetric narrowing of the right bronchi and bronchioles secondary to encasement by the peribronchovascular consolidation, which has increased compared to 08/13/2023. Interval increased nodular interlobular septal thickening throughout the right lung. New mild interlobular septal thickening of the left lung. New 1.4 x 1.4 cm left lower lobe nodule (4:119) contains internal hypodensities. New 8 mm lateral left apical nodule (4:22) and scattered ground-glass nodules, predominantly within the left upper lobe. No pneumothorax. Increased small right and moderate left pleural effusions. Upper abdomen: Normal. Musculoskeletal: Age indeterminate superior endplate compression deformity of T7, new compared to 07/25/2023. IMPRESSION: 1. Increased  peribronchovascular consolidation throughout the right lung with increased nodular interlobular septal thickening throughout the right lung, suspicious for progression of disease. 2. New 1.4 cm left lower lobe nodule contains internal hypodensities, indeterminate which may represent metastatic disease or a focus of lipoid pneumonia given reported aspiration. 3. New 8 mm lateral left apical nodule and scattered ground-glass nodules, predominantly within the left upper lobe, which may be infectious/inflammatory or metastatic. 4. Increased small right and moderate left pleural effusions. 5. Age indeterminate superior endplate compression deformity of T7, new compared to 07/25/2023. Aortic Atherosclerosis (ICD10-I70.0). Coronary artery calcifications. Assessment for potential risk factor modification, dietary therapy or pharmacologic therapy may be warranted, if clinically indicated. Electronically Signed   By: Agustin Cree M.D.   On: 09/07/2023 14:24   DG Chest Port 1 View Result Date: 09/07/2023 CLINICAL DATA:  Cough. EXAM: PORTABLE CHEST 1 VIEW COMPARISON:  08/06/2023. FINDINGS: Since the prior study, there is increasing opacification of right hemithorax with shift of mediastinal structures to the right. There is at least moderate layering right pleural effusion with associated compressive atelectatic changes in the right lung. Left lung and left lateral costophrenic angle are clear. No pneumothorax on either side. Evaluation of cardiomediastinal silhouette is nondiagnostic due to right lower hemithorax opacification. No acute osseous abnormalities. The soft tissues are within normal limits. Right-sided  CT Port-A-Cath is seen with its tip overlying the midportion of superior vena cava, unchanged. IMPRESSION: Increasing opacification of right hemithorax with shift of mediastinal structures to the right. There is at least moderate layering right pleural effusion with associated compressive atelectatic changes in the  right lung. Electronically Signed   By: Jules Schick M.D.   On: 09/07/2023 11:48    EKG: Independently reviewed.  Sinus rhythm.  Low voltage EKG.  Assessment/Plan Principal Problem:   Acute bilateral deep vein thrombosis (DVT) of upper extremities (HCC) Active Problems:   Adenocarcinoma of right lung, stage 4 (HCC)   History of non-ST elevation myocardial infarction (NSTEMI)   Hyperlipidemia   Pulmonary embolism (HCC)     Acute on chronic bilateral DVTs , extensive cancer associated DVTs , possible PE. Inconsistent use of Lovenox at home  Initially started on heparin.  However now started on Lovenox high doses twice a day.  Counseled to be consistent on medications.  Patient was very anxious about getting contrast studies, she is already diagnosed with DVT and needs therapeutic anticoagulation.  Finding of pulmonary embolism will not change management and also.  Will avoid CT angiogram.  Provided home with Lovenox when symptoms are controlled.  2.  Musculoskeletal chest pain/multifactorial shortness of breath/cancer pain: Adjust pain medications.  IV and oral pain medications.  provide all symptomatic management.  Provide benzodiazepines per comfort and anxiety.  Provide adequate IV and oral pain medications.   3.  History of stroke, hyperlipidemia: Patient on aspirin and statin that she will continue.  4.  Goal of care: Patient looks very frail and debilitated.  She now has progressive lung cancer which is metastatic disease.  Now with cancer associated thromboembolism which is progressive.  This usually indicates very poor prognosis. Patient asked appropriate questions and she is very knowledgeable about her cancer and treatment options.  Patient was on hospice program in the past with Pleurx catheter that was removed ultimately.  Now she is getting progressively weak and she is not sure she can handle chemotherapy.  She had discussion with Dr. [name] last week and decided to try  chemo however she is not sure what she will do now.  She is also seen by palliative care at the cancer center. Given her circumstances and extensive disease, she is likely candidate for palliation and hospice.  She is agreeable to discussed with palliative care again. Goal is symptom control and management at this hospital. Agreed with DNR and limited intervention.    DVT prophylaxis: Therapeutic Lovenox Code Status: DNR with limited intervention Family Communication: Daughter at the bedside Disposition Plan: Likely home. Consults called: Oncology, unable to send a message.  Added to treatment team.  Palliative care. Admission status: Inpatient.  Telemetry.   Dorcas Carrow MD [facility]

## 2023-09-08 ENCOUNTER — Inpatient Hospital Stay (HOSPITAL_COMMUNITY)

## 2023-09-08 DIAGNOSIS — Z7189 Other specified counseling: Secondary | ICD-10-CM

## 2023-09-08 DIAGNOSIS — Z515 Encounter for palliative care: Secondary | ICD-10-CM

## 2023-09-08 DIAGNOSIS — C3491 Malignant neoplasm of unspecified part of right bronchus or lung: Secondary | ICD-10-CM

## 2023-09-08 DIAGNOSIS — R531 Weakness: Secondary | ICD-10-CM

## 2023-09-08 DIAGNOSIS — E7800 Pure hypercholesterolemia, unspecified: Secondary | ICD-10-CM

## 2023-09-08 DIAGNOSIS — I2699 Other pulmonary embolism without acute cor pulmonale: Secondary | ICD-10-CM | POA: Diagnosis not present

## 2023-09-08 DIAGNOSIS — I252 Old myocardial infarction: Secondary | ICD-10-CM | POA: Diagnosis not present

## 2023-09-08 LAB — COMPREHENSIVE METABOLIC PANEL
ALT: 19 U/L (ref 0–44)
AST: 25 U/L (ref 15–41)
Albumin: 2.8 g/dL — ABNORMAL LOW (ref 3.5–5.0)
Alkaline Phosphatase: 54 U/L (ref 38–126)
Anion gap: 7 (ref 5–15)
BUN: 19 mg/dL (ref 8–23)
CO2: 23 mmol/L (ref 22–32)
Calcium: 9.2 mg/dL (ref 8.9–10.3)
Chloride: 107 mmol/L (ref 98–111)
Creatinine, Ser: 1.26 mg/dL — ABNORMAL HIGH (ref 0.44–1.00)
GFR, Estimated: 45 mL/min — ABNORMAL LOW (ref >60)
Glucose, Bld: 141 mg/dL — ABNORMAL HIGH (ref 70–99)
Potassium: 4.1 mmol/L (ref 3.5–5.1)
Sodium: 137 mmol/L (ref 135–145)
Total Bilirubin: 0.5 mg/dL (ref 0.0–1.2)
Total Protein: 5.7 g/dL — ABNORMAL LOW (ref 6.5–8.1)

## 2023-09-08 LAB — CBC
HCT: 35.4 % — ABNORMAL LOW (ref 36.0–46.0)
Hemoglobin: 11.3 g/dL — ABNORMAL LOW (ref 12.0–15.0)
MCH: 26.8 pg (ref 26.0–34.0)
MCHC: 31.9 g/dL (ref 30.0–36.0)
MCV: 84.1 fL (ref 80.0–100.0)
Platelets: 119 10*3/uL — ABNORMAL LOW (ref 150–400)
RBC: 4.21 MIL/uL (ref 3.87–5.11)
RDW: 14.2 % (ref 11.5–15.5)
WBC: 8.5 10*3/uL (ref 4.0–10.5)
nRBC: 0 % (ref 0.0–0.2)

## 2023-09-08 MED ORDER — FENTANYL CITRATE PF 50 MCG/ML IJ SOSY
50.0000 ug | PREFILLED_SYRINGE | INTRAMUSCULAR | Status: DC | PRN
  Administered 2023-09-08 – 2023-09-09 (×9): 50 ug via INTRAVENOUS
  Administered 2023-09-10: 75 ug via INTRAVENOUS
  Administered 2023-09-10: 50 ug via INTRAVENOUS
  Filled 2023-09-08 (×3): qty 1
  Filled 2023-09-08: qty 2
  Filled 2023-09-08 (×7): qty 1

## 2023-09-08 MED ORDER — LIDOCAINE HCL 1 % IJ SOLN
INTRAMUSCULAR | Status: AC
  Filled 2023-09-08: qty 20

## 2023-09-08 MED ORDER — FAMOTIDINE 20 MG PO TABS
20.0000 mg | ORAL_TABLET | Freq: Every day | ORAL | Status: DC
  Administered 2023-09-08 – 2023-09-09 (×2): 20 mg via ORAL
  Filled 2023-09-08 (×2): qty 1

## 2023-09-08 NOTE — Consult Note (Signed)
 Consultation Note Date: 09/08/2023   Patient Name: Allison [name]  DOB: 1951-01-21  MRN: [id number]  Age / Sex: 73 y.o., female  PCP: [name], MD Referring Physician: Meredeth Ide, MD  Reason for Consultation: Establishing goals of care  HPI/Patient Profile: 73 y.o. female admitted on 09/07/2023 73 y.o. female with medical history significant of stage IV lung cancer treated with chemoradiation since 2019, now with recurrent lung cancer, metastatic lymphadenopathy and malignant right-sided pleural effusion, multiple DVTs and history of stroke.   Clinical Assessment and Goals of Care: Patient came into the hospital with left arm pain and anterior chest pain.  She is feeling weak and has been having shortness of breath and loss of appetite.  Of note, she follows palliative services at the cancer center. In the emergency room she was found to have acute DVT right subclavian right axillary and right brachial veins, on the left side DVT in internal jugular subclavian and brachial veins. Patient was started on anticoagulation with Lovenox.  Additionally, further lung imaging is being done to determine whether the patient needs thoracentesis.  Patient remains admitted to hospital medicine service. Palliative consult request received, chart reviewed, patient seen and examined.  Additionally discussed with 2 daughters. Palliative medicine is specialized medical care for people living with serious illness. It focuses on providing relief from the symptoms and stress of a serious illness. The goal is to improve quality of life for both the patient and the family. Goals of care: Broad aims of medical therapy in relation to the patient's values and preferences. Our aim is to provide medical care aimed at enabling patients to achieve the goals that matter most to them, given the circumstances of their particular medical  situation and their constraints.    NEXT OF KIN  Has 3 daughters.   SUMMARY OF RECOMMENDATIONS   Goals of care discussions undertaken with the patient, as well as with her daughters.  Additional discussions were held with the 2 daughters outside the room in detail as well.  Discussed frankly but compassionately about the patient's gradual progressive decline and worsening symptom burden from cancer.  Offered the medical recommendation for considering a comfort based mode of care and consideration for hospice philosophy of care.  Patient and family believe that they would like to control her symptoms, monitor how this hospitalization course goes and overall, they remain hopeful that the patient ought to start on previously proposed anticancer treatments.  Patient follows with Dr. [name].  Increase the dose of fentanyl IV as needed, monitor how the patient does over the weekend and continue goals of care discussions during the week. All of their questions answered to the best of my ability. Thank you for the consult.  Code Status/Advance Care Planning: DNR   Symptom Management:     Palliative Prophylaxis:  Frequent Pain Assessment  Additional Recommendations (Limitations, Scope, Preferences): Full Scope Treatment  Psycho-social/Spiritual:  Desire for further Chaplaincy support:yes Additional Recommendations: Caregiving  Support/Resources  Prognosis:  Unable to determine  Discharge Planning:  To Be Determined      Primary Diagnoses: Present on Admission:  Acute bilateral deep vein thrombosis (DVT) of upper extremities (HCC)  Hyperlipidemia  Pulmonary embolism (HCC)  Adenocarcinoma of right lung, stage 4 (HCC)   I have reviewed the medical record, interviewed the patient and family, and examined the patient. The following aspects are pertinent.  Past Medical History:  Diagnosis Date   Anemia    Anxiety    Arthritis    Asthma    exercise induced   Coronary artery  disease    Depression    PMH   Dyspnea    GERD (gastroesophageal reflux disease)    Glaucoma    History of radiation therapy 01/05/2021   IMRT right lung  11/24/2020-01/05/2021  Dr Antony Blackbird   Hypertension    lung ca dx'd 11/2017   right   Malignant pleural effusion    right   Nuclear sclerotic cataract of right eye 02/28/2021   Dr. Sallye Lat, cataract surgery February 2023   PONV (postoperative nausea and vomiting)    Pre-diabetes    Raynaud's disease    Raynaud's disease    Stroke (HCC) 01/2021   balance off, some express aphasia, weakness   Social History   Socioeconomic History   Marital status: Widowed    Spouse name: Not on file   Number of children: 3   Years of education: Not on file   Highest education level: Not on file  Occupational History    Employer: [employer]  Tobacco Use   Smoking status: Never   Smokeless tobacco: Never  Vaping Use   Vaping status: Never Used  Substance and Sexual Activity   Alcohol use: Not Currently    Comment: up to 3 drinks per week   Drug use: No    Comment: CBD oil    Sexual activity: Not Currently    Comment: Hysterectomy  Other Topics Concern   Not on file  Social History Narrative   Not on file   Social Drivers of Health   Financial Resource Strain: Low Risk  (07/22/2019)   Overall Financial Resource Strain (CARDIA)    Difficulty of Paying Living Expenses: Not hard at all  Food Insecurity: No Food Insecurity (09/07/2023)   Hunger Vital Sign    Worried About Running Out of Food in the Last Year: Never true    Ran Out of Food in the Last Year: Never true  Transportation Needs: No Transportation Needs (09/07/2023)   PRAPARE - Administrator, Civil Service (Medical): No    Lack of Transportation (Non-Medical): No  Physical Activity: Sufficiently Active (07/22/2019)   Exercise Vital Sign    Days of Exercise per Week: 5 days    Minutes of Exercise per Session: 30 min  Stress: Not on file  Social  Connections: Socially Integrated (09/07/2023)   Social Connection and Isolation Panel [NHANES]    Frequency of Communication with Friends and Family: Three times a week    Frequency of Social Gatherings with Friends and Family: Once a week    Attends Religious Services: More than 4 times per year    Active Member of [employer] or Organizations: Yes    Attends Engineer, structural: More than 4 times per year    Marital Status: Living with partner   Family History  Problem Relation Age of Onset   Heart disease Sister    Heart disease Brother    Lung cancer Other  Scheduled Meds:  benzonatate  200 mg Oral TID   carvedilol  12.5 mg Oral BID WC   Chlorhexidine Gluconate Cloth  6 each Topical Daily   cholecalciferol  5,000 Units Oral Daily   docusate sodium  100 mg Oral BID   dorzolamide-timolol  1 drop Both Eyes BID   enoxaparin (LOVENOX) injection  55 mg Subcutaneous Q12H   ezetimibe  10 mg Oral Daily   FLUoxetine  20 mg Oral Daily   isosorbide mononitrate  15 mg Oral Daily   nystatin  5 mL Oral QID   predniSONE  10 mg Oral Q breakfast   sodium chloride flush  10-40 mL Intracatheter Q12H   Continuous Infusions: PRN Meds:.acetaminophen, ALPRAZolam, fentaNYL (SUBLIMAZE) injection, HYDROcodone bit-homatropine, hyoscyamine, iohexol, ipratropium-albuterol, ondansetron **OR** ondansetron (ZOFRAN) IV, sodium chloride flush Medications Prior to Admission:  Prior to Admission medications   Medication Sig Start Date End Date Taking? Authorizing Provider  acetaminophen (TYLENOL) 500 MG tablet Take 1,000 mg by mouth every 6 (six) hours as needed (pain).   Yes [provider]  ALPRAZolam (XANAX) 0.25 MG tablet Take 1 tablet (0.25 mg total) by mouth 2 (two) times daily as needed for anxiety. May take 2 tablets (0.5 mg total) at bedtime. Patient taking differently: Take 0.25-0.5 mg by mouth 2 (two) times daily as needed for anxiety or sleep. 11/23/22  Yes Pickenpack-Cousar, Arty Baumgartner, NP   amLODipine (NORVASC) 5 MG tablet Take 1 tablet (5 mg total) by mouth daily. 05/28/23  Yes Jodelle Gross, NP  carvedilol (COREG) 12.5 MG tablet Take 1 tablet (12.5 mg total) by mouth 2 (two) times daily with a meal. 05/28/23  Yes Jodelle Gross, NP  docusate sodium (COLACE) 100 MG capsule Take 1 capsule (100 mg total) by mouth 2 (two) times daily. 09/02/22  Yes Pickenpack-Cousar, Arty Baumgartner, NP  dorzolamide-timolol (COSOPT) 22.3-6.8 MG/ML ophthalmic solution Place 1 drop into both eyes 2 (two) times daily.   Yes [provider]  enoxaparin (LOVENOX) 80 MG/0.8ML injection Inject 80 mg into the skin every evening.   Yes [provider]  ezetimibe (ZETIA) 10 MG tablet Take 10 mg by mouth daily. 02/25/22  Yes [provider]  FeFum-FePoly-FA-B Cmp-C-Biot (FOLIVANE-PLUS) CAPS Take 1 capsule by mouth in the morning. Patient taking differently: Take 1 capsule by mouth every other day. 07/21/22  Yes Heilingoetter, Cassandra L, PA-C  FLUoxetine (PROZAC) 20 MG capsule Take 1 capsule (20 mg total) by mouth daily. 12/06/21  Yes Reed, Tiffany L, DO  folic acid (FOLVITE) 1 MG tablet TAKE 1 TABLET BY MOUTH EVERY DAY 08/06/23  Yes Heilingoetter, Cassandra L, PA-C  Glycopyrrolate-Formoterol (BEVESPI AEROSPHERE) 9-4.8 MCG/ACT AERO Inhale 2 puffs into the lungs 2 (two) times daily. 08/20/23  Yes Bevelyn Ngo, NP  HYDROcodone bit-homatropine (HYCODAN) 5-1.5 MG/5ML syrup Take 5 mLs by mouth every 6 (six) hours as needed for cough. 08/20/23  Yes Bevelyn Ngo, NP  hyoscyamine (LEVSIN SL) 0.125 MG SL tablet Place 1-2 tablets (0.125-0.25mg ) under the tongue every 6 (six) hours as needed for cramping. 05/02/23  Yes Pickenpack-Cousar, Arty Baumgartner, NP  ipratropium-albuterol (DUONEB) 0.5-2.5 (3) MG/3ML SOLN Take 3 mLs by nebulization every 6 (six) hours as needed (Asthma). 07/06/23  Yes Pickenpack-Cousar, Arty Baumgartner, NP  isosorbide mononitrate (IMDUR) 30 MG 24 hr tablet Take 0.5 tablets (15 mg total) by mouth  daily. 12/12/22  Yes Jodelle Gross, NP  latanoprost (XALATAN) 0.005 % ophthalmic solution Place 1 drop into both eyes at bedtime.  12/14/17  Yes [provider]  ondansetron (ZOFRAN-ODT) 4 MG disintegrating tablet Take 1 tablet (4 mg total) by mouth every 8 (eight) hours as needed for nausea or vomiting. Patient taking differently: Take 4 mg by mouth every 8 (eight) hours as needed for nausea or vomiting (dissolve orally). 07/07/21  Yes Heilingoetter, Cassandra L, PA-C  osimertinib mesylate (TAGRISSO) 80 MG tablet Take 1 tablet (80 mg total) by mouth daily. 09/20/22  Yes Si Gaul, MD  PEPCID AC 10 MG tablet Take 10 mg by mouth 2 (two) times daily.   Yes [provider]  predniSONE (DELTASONE) 10 MG tablet Take 1 tablet (10 mg total) by mouth daily with breakfast. 09/04/23  Yes Heilingoetter, Cassandra L, PA-C  prochlorperazine (COMPAZINE) 10 MG tablet Take 1 tablet (10 mg total) by mouth every 6 (six) hours as needed. Patient taking differently: Take 10 mg by mouth every 6 (six) hours as needed for nausea or vomiting. 08/23/23  Yes Heilingoetter, Cassandra L, PA-C  REPATHA SURECLICK 140 MG/ML SOAJ Inject 140 mg into the skin every 14 (fourteen) days. 06/01/23  Yes Jodelle Gross, NP  Wheat Dextrin (BENEFIBER DRINK MIX) PACK Take 1 packet by mouth daily.   Yes [provider]  benzonatate (TESSALON) 100 MG capsule Take 2 capsules (200 mg total) by mouth 3 (three) times daily as needed for cough. Patient not taking: Reported on 09/07/2023 07/16/23     CHELATED MAGNESIUM PO Take 200 mg by mouth daily. Patient not taking: Reported on 09/07/2023    [provider]  Cholecalciferol (VITAMIN D) 125 MCG (5000 UT) CAPS Take 1 Capful by mouth daily. Patient not taking: Reported on 09/07/2023    [provider]  dexamethasone (DECADRON) 4 MG tablet Take 1 tablet twice a day the day before, day of, and day after treatment Patient not taking: Reported on  09/07/2023 08/23/23   Heilingoetter, Cassandra L, PA-C  doxycycline (VIBRAMYCIN) 100 MG capsule Take 1 capsule (100 mg total) by mouth 2 (two) times daily for 7 days. Patient not taking: Reported on 09/07/2023 07/16/23     levofloxacin (LEVAQUIN) 750 MG tablet Take 1 tablet (750 mg total) by mouth every other day. Patient not taking: Reported on 09/07/2023 08/02/23   Si Gaul, MD  nystatin (MYCOSTATIN) 100000 UNIT/ML suspension Take 5 mLs (500,000 Units total) by mouth 4 (four) times daily. Patient not taking: Reported on 09/07/2023 08/23/23   Heilingoetter, Cassandra L, PA-C  prochlorperazine (COMPAZINE) 10 MG tablet Take 1 tablet (10 mg total) by mouth every 6 (six) hours as needed. Patient not taking: Reported on 09/07/2023 07/20/22   Heilingoetter, Cassandra L, PA-C   Allergies  Allergen Reactions   Hydrocodone-Acetaminophen Other (See Comments)    Irregular heart rate Can tolerate Hycodan in 2025.   Other Other (See Comments)    No cold water = triggers coughing!!  Unnamed glaucoma eye drop - turned eyes dark   Penicillins Other (See Comments)    SYNCOPE PATIENT HAS HAD A PCN REACTION WITH IMMEDIATE RASH, FACIAL/TONGUE/THROAT SWELLING, SOB, OR LIGHTHEADEDNESS WITH HYPOTENSION: Yes Has patient had a PCN reaction causing severe rash involving mucus membranes or skin necrosis: No Has patient had a PCN reaction that required hospitalization: No Has patient had a PCN reaction occurring within the last 10 years: No   Lactose Other (See Comments)    Allergy to all dairy products Abdominal cramping   Vicodin [Hydrocodone-Acetaminophen] Other (See Comments)    Sped up heart and breathing (tolerates Hycodan in 2025)  Bacid Nausea And Vomiting   Ipratropium-Albuterol Cough   Review of Systems +weakness + generalized pain.   Physical Exam Restless, awake alert In generalized pain Generalized weakness evident  Vital Signs: BP (!) 118/58 (BP Location: Left Arm)   Pulse 78   Temp 98.1  F (36.7 C)   Resp 16   Ht 5\' 4"  (1.626 m)   Wt 56.2 kg   LMP  (LMP Unknown)   SpO2 96%   BMI 21.28 kg/m  Pain Scale: 0-10   Pain Score: 8    SpO2: SpO2: 96 % O2 Device:SpO2: 96 % O2 Flow Rate: .   IO: Intake/output summary: No intake or output data in the 24 hours ending 09/08/23 1254  LBM:   Baseline Weight: Weight: 56.2 kg Most recent weight: Weight: 56.2 kg     Palliative Assessment/Data:   PPS 50%  Time In:  12 Time Out:  1300 Time Total:  60  Greater than 50%  of this time was spent counseling and coordinating care related to the above assessment and plan.  Signed by: Rosalin Hawking, MD   Please contact Palliative Medicine Team phone at [phone] for questions and concerns.  For individual provider: See Loretha Stapler

## 2023-09-08 NOTE — Plan of Care (Signed)

## 2023-09-08 NOTE — Progress Notes (Signed)
 [facility]  PROGRESS NOTE  Allison [name] VWU:[id number] DOB: 1950/07/02 DOA: 09/07/2023 PCP: [name], MD   Brief HPI:    73 y.o. female with medical history significant of stage IV lung cancer treated with chemoradiation since 2019, now with recurrent lung cancer, metastatic lymphadenopathy and malignant right-sided pleural effusion, multiple DVTs and history of stroke currently on Lovenox at home 80 mg daily presented to the emergency room with coughing after taking medications, swelling of the left arm, feeling swelling of the left anterior chest wall, shortness of breath and just not feeling well.  Patient stated that she was recently found to have recurrent cancer and is planning to start chemotherapy, however this was postponed because of patient not looking good.  She was supposed to have seen at infusion center today to give IV fluids with plan to start chemotherapy next week.  Patient has chronic loss of appetite.   Upper extremity ultrasound bilateral showed Acute DVT right subclavian, right axillary and right brachial veins, age-indeterminate DVT right internal jugular On the left side she has DVT on the internal jugular subclavian and brachial veins.   Assessment/Plan:   Acute on chronic bilateral DVTs , extensive cancer associated DVTs , possible PE. Inconsistent use of Lovenox at home  Initially started on heparin.  However now started on Lovenox high doses twice a day.  Counseled to be consistent on medications.  Patient was very anxious about getting contrast studies, she is already diagnosed with DVT and needs therapeutic anticoagulation.  Finding of pulmonary embolism will not change management and also.  Will avoid CT angiogram.  Provided home with Lovenox when symptoms are controlled.  CT chest without contrast showed increased peribronchovascular consolidation throughout the right lung with increased nodular interlobular septal thickening throughout the right lung  suspicious for progression of disease.   2.  Musculoskeletal chest pain/multifactorial shortness of breath/cancer pain: Adjust pain medications.  IV and oral pain medications.  provide all symptomatic management.  Provide benzodiazepines per comfort and anxiety.  Provide adequate IV and oral pain medications. Palliative care consulted for pain management  3.  History of stroke, hyperlipidemia: Patient on aspirin and statin    4.  Goal of care: Patient looks very frail and debilitated.  She now has progressive lung cancer which is metastatic disease.  Now with cancer associated thromboembolism which is progressive.  This usually indicates very poor prognosis. Patient asked appropriate questions and she is very knowledgeable about her cancer and treatment options.  Patient was on hospice program in the past with Pleurx catheter that was removed ultimately.  Now she is getting progressively weak and she is not sure she can handle chemotherapy.  She had discussion with Dr. [name] last week and decided to try chemo however she is not sure what she will do now.  She is also seen by palliative care at the cancer center. Palliative care consulted for pain management  5.  Bilateral small pleural effusion -Thoracentesis was ordered by admitting provider however CT chest without contrast shows very small pleural effusion in both right and left pleural space -Discussed with IR, oncology.  No clear indication for thoracentesis. -Patient is asymptomatic, not requiring oxygen. -Will discontinue thoracentesis at this time.   Medications     benzonatate  200 mg Oral TID   carvedilol  12.5 mg Oral BID WC   Chlorhexidine Gluconate Cloth  6 each Topical Daily   cholecalciferol  5,000 Units Oral Daily   docusate sodium  100 mg  Oral BID   dorzolamide-timolol  1 drop Both Eyes BID   enoxaparin (LOVENOX) injection  55 mg Subcutaneous Q12H   ezetimibe  10 mg Oral Daily   FLUoxetine  20 mg Oral Daily    isosorbide mononitrate  15 mg Oral Daily   nystatin  5 mL Oral QID   predniSONE  10 mg Oral Q breakfast   sodium chloride flush  10-40 mL Intracatheter Q12H     Data Reviewed:   CBG:  No results for input(s): "GLUCAP" in the last 168 hours.  SpO2: 100 %    Vitals:   09/07/23 2000 09/07/23 2148 09/08/23 0159 09/08/23 0521  BP: 139/61 (!) 153/86 (!) 155/87 121/69  Pulse: (!) 59 (!) 101 89 73  Resp: 17 18 18 16   Temp:  97.9 F (36.6 C) 98.1 F (36.7 C) 97.6 F (36.4 C)  TempSrc:      SpO2: 95% 97% 100% 100%  Weight:      Height:          Data Reviewed:  Basic Metabolic Panel: Recent Labs  Lab 09/04/23 1107 09/07/23 1302  NA 133* 137  K 4.1 3.6  CL 100 105  CO2 24 23  GLUCOSE 141* 110*  BUN 28* 15  CREATININE 1.65* 1.09*  CALCIUM 9.4 9.0    CBC: Recent Labs  Lab 09/04/23 1107 09/07/23 1302 09/08/23 0212  WBC 11.9* 9.5 8.5  NEUTROABS 10.2* 7.9*  --   HGB 12.5 11.6* 11.3*  HCT 38.5 37.6 35.4*  MCV 80.7 84.7 84.1  PLT 154 125* 119*    LFT Recent Labs  Lab 09/04/23 1107 09/07/23 1302  AST 22 21  ALT 14 18  ALKPHOS 72 59  BILITOT 0.3 0.5  PROT 6.8 6.0*  ALBUMIN 3.6 2.9*     Antibiotics: Anti-infectives (From admission, onward)    None        DVT prophylaxis: Lovenox  Code Status: DNR  Family Communication: Discussed with patient's daughter at bedside   CONSULTS Perative care   Subjective   Still complains of pain.  Denies shortness of breath at rest.  Not requiring oxygen.  Thoracentesis was ordered by the admitting provider.   Objective    Physical Examination:   General-appears in no acute distress Heart-S1-S2, regular, no murmur auscultated Lungs crackles auscultated in right lung base Abdomen-soft, nontender, no organomegaly Extremities-no edema in the lower extremities Neuro-alert, oriented x3, no focal deficit noted   Status is: Inpatient:             Meredeth Ide   [facility] If  7PM-7AM, please contact night-coverage at [email or web address], Office  [phone]   09/08/2023, 8:48 AM  LOS: 1 day

## 2023-09-08 NOTE — Plan of Care (Signed)
  Problem: Education: Goal: Knowledge of General Education information will improve Description: Including pain rating scale, medication(s)/side effects and non-pharmacologic comfort measures Outcome: Progressing   Problem: Clinical Measurements: Goal: Ability to maintain clinical measurements within normal limits will improve Outcome: Progressing Goal: Diagnostic test results will improve Outcome: Progressing   Problem: Activity: Goal: Risk for activity intolerance will decrease Outcome: Progressing   Problem: Nutrition: Goal: Adequate nutrition will be maintained Outcome: Progressing   Problem: Coping: Goal: Level of anxiety will decrease Outcome: Progressing   Problem: Pain Managment: Goal: General experience of comfort will improve and/or be controlled Outcome: Progressing

## 2023-09-09 DIAGNOSIS — Z515 Encounter for palliative care: Secondary | ICD-10-CM

## 2023-09-09 DIAGNOSIS — G893 Neoplasm related pain (acute) (chronic): Secondary | ICD-10-CM

## 2023-09-09 DIAGNOSIS — I82623 Acute embolism and thrombosis of deep veins of upper extremity, bilateral: Secondary | ICD-10-CM | POA: Diagnosis not present

## 2023-09-09 DIAGNOSIS — C3491 Malignant neoplasm of unspecified part of right bronchus or lung: Secondary | ICD-10-CM | POA: Diagnosis not present

## 2023-09-09 DIAGNOSIS — Z7189 Other specified counseling: Secondary | ICD-10-CM | POA: Diagnosis not present

## 2023-09-09 LAB — COMPREHENSIVE METABOLIC PANEL
ALT: 18 U/L (ref 0–44)
AST: 23 U/L (ref 15–41)
Albumin: 2.7 g/dL — ABNORMAL LOW (ref 3.5–5.0)
Alkaline Phosphatase: 52 U/L (ref 38–126)
Anion gap: 9 (ref 5–15)
BUN: 22 mg/dL (ref 8–23)
CO2: 22 mmol/L (ref 22–32)
Calcium: 9.1 mg/dL (ref 8.9–10.3)
Chloride: 107 mmol/L (ref 98–111)
Creatinine, Ser: 1.36 mg/dL — ABNORMAL HIGH (ref 0.44–1.00)
GFR, Estimated: 41 mL/min — ABNORMAL LOW (ref >60)
Glucose, Bld: 96 mg/dL (ref 70–99)
Potassium: 3.9 mmol/L (ref 3.5–5.1)
Sodium: 138 mmol/L (ref 135–145)
Total Bilirubin: 0.2 mg/dL (ref 0.0–1.2)
Total Protein: 5.6 g/dL — ABNORMAL LOW (ref 6.5–8.1)

## 2023-09-09 MED ORDER — GABAPENTIN 250 MG/5ML PO SOLN
200.0000 mg | Freq: Every day | ORAL | Status: DC
  Administered 2023-09-09: 200 mg via ORAL
  Filled 2023-09-09: qty 4

## 2023-09-09 MED ORDER — SODIUM CHLORIDE 0.9 % IV SOLN: INTRAVENOUS | Status: AC

## 2023-09-09 MED ORDER — OXYCODONE HCL 5 MG PO TABS
5.0000 mg | ORAL_TABLET | Freq: Four times a day (QID) | ORAL | Status: DC | PRN
  Administered 2023-09-09 – 2023-09-10 (×3): 5 mg via ORAL
  Filled 2023-09-09 (×4): qty 1

## 2023-09-09 MED ORDER — BISACODYL 5 MG PO TBEC: 5.0000 mg | DELAYED_RELEASE_TABLET | Freq: Every day | ORAL | Status: DC | PRN

## 2023-09-09 MED ORDER — FAMOTIDINE 20 MG PO TABS
20.0000 mg | ORAL_TABLET | Freq: Two times a day (BID) | ORAL | Status: DC
  Administered 2023-09-09 – 2023-09-10 (×2): 20 mg via ORAL
  Filled 2023-09-09 (×2): qty 1

## 2023-09-09 MED ORDER — POLYETHYLENE GLYCOL 3350 17 G PO PACK
17.0000 g | PACK | Freq: Every day | ORAL | Status: DC
  Administered 2023-09-09 – 2023-09-10 (×2): 17 g via ORAL
  Filled 2023-09-09 (×2): qty 1

## 2023-09-09 NOTE — Progress Notes (Signed)
 Daily Progress Note   Patient Name: Allison [name]       Date: 09/09/2023 DOB: [birth year]  Age: 73 y.o. MRN#: [id number] Attending Physician: Lilia Pro, MD Primary Care Physician: [name], MD Admit Date: 09/07/2023  Reason for Consultation/Follow-up: Pain control and cough, shortness of breath.   Subjective:  Awake alert, has cough is intractable.   Length of Stay: 2  Current Medications: Scheduled Meds:   benzonatate  200 mg Oral TID   carvedilol  12.5 mg Oral BID WC   Chlorhexidine Gluconate Cloth  6 each Topical Daily   cholecalciferol  5,000 Units Oral Daily   docusate sodium  100 mg Oral BID   dorzolamide-timolol  1 drop Both Eyes BID   enoxaparin (LOVENOX) injection  55 mg Subcutaneous Q12H   ezetimibe  10 mg Oral Daily   famotidine  20 mg Oral Daily   FLUoxetine  20 mg Oral Daily   gabapentin  200 mg Oral QHS   isosorbide mononitrate  15 mg Oral Daily   nystatin  5 mL Oral QID   polyethylene glycol  17 g Oral Daily   predniSONE  10 mg Oral Q breakfast   sodium chloride flush  10-40 mL Intracatheter Q12H    Continuous Infusions:  sodium chloride      PRN Meds: acetaminophen, ALPRAZolam, bisacodyl, fentaNYL (SUBLIMAZE) injection, HYDROcodone bit-homatropine, hyoscyamine, iohexol, ipratropium-albuterol, ondansetron **OR** ondansetron (ZOFRAN) IV, oxyCODONE, sodium chloride flush  Physical Exam         Intractable cough Restless Shortness of breath Generalized weakness  Vital Signs: BP 126/65   Pulse 75   Temp 98.4 F (36.9 C) (Oral)   Resp 16   Ht 5\' 4"  (1.626 m)   Wt 56.2 kg   LMP  (LMP Unknown)   SpO2 96%   BMI 21.28 kg/m  SpO2: SpO2: 96 % O2 Device: O2 Device: Room Air O2 Flow Rate:    Intake/output summary:  Intake/Output  Summary (Last 24 hours) at 09/09/2023 1240 Last data filed at 09/09/2023 1020 Gross per 24 hour  Intake 730 ml  Output --  Net 730 ml   LBM:   Baseline Weight: Weight: 56.2 kg Most recent weight: Weight: 56.2 kg       Palliative Assessment/Data:      Patient Active Problem List  Diagnosis Date Noted   Acute bilateral deep vein thrombosis (DVT) of upper extremities (HCC) 09/07/2023   Abnormal glucose level 01/18/2022   Anemia 10/07/2021   Neutropenia (HCC) 10/07/2021   Pseudophakia of right eye 09/22/2021   Port-A-Cath in place 07/07/2021   Protein-calorie malnutrition, severe 04/25/2021   Pneumothorax after biopsy 04/21/2021   Lung nodule 04/01/2021   Epiretinal membrane, right eye 02/28/2021   Nuclear sclerotic cataract of left eye 02/28/2021   Coronary artery disease of native artery of native heart with stable angina pectoris (HCC)    CVA (cerebral vascular accident) (HCC) 02/12/2021   Current use of long term anticoagulation 02/12/2021   Hypokalemia 02/12/2021   Leg DVT (deep venous thromboembolism), acute, bilateral (HCC) 02/08/2021   Aortic atherosclerosis (HCC) 02/08/2021   Respiratory failure (HCC) 02/08/2021   Postobstructive pneumonia 02/07/2021   Pulmonary embolism (HCC) 02/07/2021   Elevated troponin 02/07/2021   Encounter to establish care 02/02/2021   Shortness of breath 02/02/2021   Cough 02/02/2021   Statin myopathy 12/03/2020   Acute non-recurrent frontal sinusitis 11/18/2020   NSCLC with EGFR mutation (HCC) 06/30/2020   Hx of chest tube placement 02/11/2020   History of chest tube placement 11/12/2019   Abnormal liver function 08/20/2019   Gastroesophageal reflux disease without esophagitis 08/20/2019   Generalized anxiety disorder 08/20/2019   Glaucoma 08/20/2019   Hyperlipidemia 08/20/2019   Menopausal syndrome 08/20/2019   Raynaud's phenomenon 08/20/2019   Vitamin D deficiency 08/20/2019   Status post coronary artery stent placement     History of non-ST elevation myocardial infarction (NSTEMI) 06/12/2019   Hypertension 03/24/2019   Encounter for antineoplastic chemotherapy 01/22/2018   Adenocarcinoma of right lung, stage 4 (HCC) 01/04/2018   Goals of care, counseling/discussion 01/04/2018   Thyroid nodule 12/11/2017   Pleural effusion, right 12/11/2017   Heel pain 03/29/2015   Insomnia 04/20/2014    Palliative Care Assessment & Plan   Patient Profile:    Assessment:  73 y.o. female with medical history significant of stage IV lung cancer treated with chemoradiation since 2019, now with recurrent lung cancer, metastatic lymphadenopathy and malignant right-sided pleural effusion, DVTs and history of stroke currently on Lovenox at home 80 mg daily.  She presented to the emergency room with coughing, shortness of breath, swelling of the left arm, left anterior chest wall.  Patient was being considered to start chemotherapy, however this was postponed due to her clinical status.  She has been followed by oncologist Dr. [name]. Upper extremity ultrasound bilateral showed,Acute DVT right subclavian, right axillary and right brachial veins, age-indeterminate DVT right internal jugular.  She also has left-sided DVT in the internal jugular subclavian and brachial veins.    Recommendations/Plan: Add Gabapentin for cough.  Continue current mode of care.      Code Status:    Code Status Orders  (From admission, onward)           Start     Ordered   09/07/23 1732  Do not attempt resuscitation (DNR)- Limited -Do Not Intubate (DNI)  Continuous       Question Answer Comment  If pulseless and not breathing No CPR or chest compressions.   In Pre-Arrest Conditions (Patient Is Breathing and Has A Pulse) Do not intubate. Provide all appropriate non-invasive medical interventions. Avoid ICU transfer unless indicated or required.   Consent: Discussion documented in EHR or advanced directives reviewed      09/07/23 1732            Code  Status History     Date Active Date Inactive Code Status Order ID Comments User Context   04/23/2021 1232 04/26/2021 2117 DNR [id number]  Ernie Avena, NP Inpatient   04/21/2021 1244 04/23/2021 1232 Full Code [id number]  Talitha Givens, NP Inpatient   02/12/2021 0218 02/16/2021 1950 Full Code [id number]  Chotiner, Claudean Severance, MD ED   02/07/2021 2025 02/10/2021 2056 Full Code [id number]  Therisa Doyne, MD ED   06/13/2019 0028 06/17/2019 1749 Full Code [id number]  Milana Na, DO Inpatient      Advance Directive Documentation    Flowsheet Row Most Recent Value  Type of Advance Directive Living will  Pre-existing out of facility DNR order (yellow form or pink MOST form) --  "MOST" Form in Place? --       Prognosis:  guarded  Discharge Planning: To Be Determined  Care plan was discussed with patient and daughter.  Thank you for allowing the Palliative Medicine Team to assist in the care of this patient.  Mod MDM.      Greater than 50%  of this time was spent counseling and coordinating care related to the above assessment and plan.  Rosalin Hawking, MD  Please contact Palliative Medicine Team phone at [phone] for questions and concerns.

## 2023-09-09 NOTE — Progress Notes (Signed)
 Progress Note   Patient: Allison [name] WGN:[id number] DOB: December 10, 1950 DOA: 09/07/2023     2 DOS: the patient was seen and examined on 09/09/2023   Brief hospital course:   73 y.o. female with medical history significant of stage IV lung cancer treated with chemoradiation since 2019, now with recurrent lung cancer, metastatic lymphadenopathy and malignant right-sided pleural effusion, DVTs and history of stroke currently on Lovenox at home 80 mg daily.  She presented to the emergency room with coughing, shortness of breath, swelling of the left arm, left anterior chest wall.  Patient was being considered to start chemotherapy, however this was postponed due to her clinical status.  She has been followed by oncologist Dr. [name]. Upper extremity ultrasound bilateral showed,Acute DVT right subclavian, right axillary and right brachial veins, age-indeterminate DVT right internal jugular.  She also has left-sided DVT in the internal jugular subclavian and brachial veins.  Assessment and Plan:  #.  Acute on chronic bilateral DVTs most likely secondary to extensive stage IV cancer disease.  Due to AKI, PE could not be ruled out with contrast studies. Initially started on heparin, now switched to Lovenox twice a day.   #.  Acute chest pain and chest wall pain.  Likely due to right-sided bronchial lesion, pleural effusion and metastatic bone disease. CT chest without contrast showed increased peribronchovascular consolidation throughout the right lung with increased nodular interlobular septal thickening throughout the right lung suspicious for progression of disease.   #.  Musculoskeletal chest pain/multifactorial shortness of breath/cancer pain: Adjust pain medications.  IV and oral pain medications.  provide all symptomatic management.  Palliative care consult input appreciated to help with complex decision making.  Patient is on fentanyl as needed.  Benzodiazepines per comfort and anxiety.  Provide  adequate IV and oral pain medications.  # Bilateral pleural effusion: Right-sided pleural effusion > left.  Right-sided opacification more pronounced: Most likely due to lung cancer and effusion.  Not a candidate for thoracentesis due to small size of effusions.  Patient currently not requiring any oxygen supplementation.  Intermittent coughing episodes.  Patient is on 2 snacks for symptom management.  #History of stroke, hyperlipidemia: Patient on aspirin and statin   #Palliative care team consult appreciated.  Recommendations also noted.  Subjective: Patient is a pleasant female.  Daughter at bedside.  Complains of pain.  She is requesting for pain medication that would last longer.  Trial of oxycodone were offered.  Explained the effects of oxycodone.  No prior use of oxycodone's.  Tolerating hydrocodone's vin cough syrup without adverse sequelae.  Physical Exam: Vitals:   09/08/23 2002 09/09/23 0601 09/09/23 0824 09/09/23 0838  BP: 120/63 (!) 144/84 126/65 126/65  Pulse: 74 81 75 75  Resp: 18 18 16    Temp: 97.9 F (36.6 C) 97.9 F (36.6 C) 98.4 F (36.9 C)   TempSrc:   Oral   SpO2: 97% 96% 96%   Weight:      Height:       General: Chronically ill looking female, mild distress. HEENT: Slightly puffy face Neck with distended veins. Cardiovascular: S1-S2. Abdomen: Soft nontender bowel sounds present. Extremities: Upper extremity with edema. CNS: Shows no obvious focal deficits  Data Reviewed: Sodium is 138, potassium 3.9, chloride is 107, bicarb 22, BUN 22, creatinine 1.36, AST 23, ALT 18, total protein 5.6.  WBC 8.5, hemoglobin 11.3, hematocrit 35.4, platelet count 119. CT scan with findings as interpreted by radiologist, noted below . Increased peribronchovascular consolidation throughout the right  lung with increased nodular interlobular septal thickening throughout the right lung, suspicious for progression of disease. 2. New 1.4 cm left lower lobe nodule contains  internal hypodensities, indeterminate which may represent metastatic disease or a focus of lipoid pneumonia given reported aspiration. 3. New 8 mm lateral left apical nodule and scattered ground-glass nodules, predominantly within the left upper lobe, which may be infectious/inflammatory or metastatic. 4. Increased small right and moderate left pleural effusions. 5. Age indeterminate superior endplate compression deformity of T7, new compared to 07/25/2023.  Family Communication: Daughter at bedside were updated regarding plan of care  Disposition: Status is: Inpatient Remains inpatient appropriate because: Acute on chronic pain, extensive DVT disease  Planned Discharge Destination:  TBD  She patient is currently receiving DVT treatment. Time spent: 40 minutes  Author: Lilia Pro, MD 09/09/2023 11:34 AM  For on call review [email or web address].

## 2023-09-09 NOTE — Plan of Care (Signed)
  Problem: Education: Goal: Knowledge of General Education information will improve Description: Including pain rating scale, medication(s)/side effects and non-pharmacologic comfort measures Outcome: Progressing   Problem: Clinical Measurements: Goal: Ability to maintain clinical measurements within normal limits will improve Outcome: Progressing Goal: Diagnostic test results will improve Outcome: Progressing   Problem: Activity: Goal: Risk for activity intolerance will decrease Outcome: Progressing   Problem: Nutrition: Goal: Adequate nutrition will be maintained Outcome: Progressing   Problem: Pain Managment: Goal: General experience of comfort will improve and/or be controlled Outcome: Progressing

## 2023-09-09 NOTE — Plan of Care (Signed)
   Problem: Nutrition: Goal: Adequate nutrition will be maintained Outcome: Progressing   Problem: Pain Managment: Goal: General experience of comfort will improve and/or be controlled Outcome: Progressing   Problem: Safety: Goal: Ability to remain free from injury will improve Outcome: Progressing

## 2023-09-10 ENCOUNTER — Other Ambulatory Visit (HOSPITAL_COMMUNITY)

## 2023-09-10 ENCOUNTER — Other Ambulatory Visit (HOSPITAL_COMMUNITY): Payer: Self-pay

## 2023-09-10 DIAGNOSIS — I82623 Acute embolism and thrombosis of deep veins of upper extremity, bilateral: Secondary | ICD-10-CM | POA: Diagnosis not present

## 2023-09-10 DIAGNOSIS — R052 Subacute cough: Secondary | ICD-10-CM | POA: Diagnosis not present

## 2023-09-10 DIAGNOSIS — Z515 Encounter for palliative care: Secondary | ICD-10-CM | POA: Diagnosis not present

## 2023-09-10 DIAGNOSIS — Z7189 Other specified counseling: Secondary | ICD-10-CM | POA: Diagnosis not present

## 2023-09-10 LAB — BASIC METABOLIC PANEL
Anion gap: 7 (ref 5–15)
BUN: 20 mg/dL (ref 8–23)
CO2: 22 mmol/L (ref 22–32)
Calcium: 8.9 mg/dL (ref 8.9–10.3)
Chloride: 108 mmol/L (ref 98–111)
Creatinine, Ser: 1.11 mg/dL — ABNORMAL HIGH (ref 0.44–1.00)
GFR, Estimated: 53 mL/min — ABNORMAL LOW (ref >60)
Glucose, Bld: 103 mg/dL — ABNORMAL HIGH (ref 70–99)
Potassium: 4 mmol/L (ref 3.5–5.1)
Sodium: 137 mmol/L (ref 135–145)

## 2023-09-10 LAB — CBC
HCT: 33.4 % — ABNORMAL LOW (ref 36.0–46.0)
Hemoglobin: 10.2 g/dL — ABNORMAL LOW (ref 12.0–15.0)
MCH: 26.2 pg (ref 26.0–34.0)
MCHC: 30.5 g/dL (ref 30.0–36.0)
MCV: 85.6 fL (ref 80.0–100.0)
Platelets: 133 10*3/uL — ABNORMAL LOW (ref 150–400)
RBC: 3.9 MIL/uL (ref 3.87–5.11)
RDW: 14.6 % (ref 11.5–15.5)
WBC: 8.2 10*3/uL (ref 4.0–10.5)
nRBC: 0 % (ref 0.0–0.2)

## 2023-09-10 MED ORDER — ENOXAPARIN SODIUM 60 MG/0.6ML IJ SOSY
55.0000 mg | PREFILLED_SYRINGE | Freq: Two times a day (BID) | INTRAMUSCULAR | 1 refills | Status: DC
  Filled 2023-09-10: qty 36, 33d supply, fill #0
  Filled 2023-11-08: qty 36, 30d supply, fill #0

## 2023-09-10 MED ORDER — DEXAMETHASONE 4 MG PO TABS
4.0000 mg | ORAL_TABLET | Freq: Once | ORAL | Status: AC
  Administered 2023-09-10: 4 mg via ORAL
  Filled 2023-09-10: qty 1

## 2023-09-10 MED ORDER — GABAPENTIN 250 MG/5ML PO SOLN
200.0000 mg | Freq: Every day | ORAL | 1 refills | Status: DC
  Filled 2023-09-10: qty 110, 27d supply, fill #0

## 2023-09-10 MED ORDER — HEPARIN SOD (PORK) LOCK FLUSH 100 UNIT/ML IV SOLN
500.0000 [IU] | INTRAVENOUS | Status: AC | PRN
  Administered 2023-09-10: 500 [IU]
  Filled 2023-09-10: qty 5

## 2023-09-10 MED ORDER — OXYCODONE HCL 5 MG PO TABS
5.0000 mg | ORAL_TABLET | Freq: Four times a day (QID) | ORAL | 0 refills | Status: DC | PRN
  Filled 2023-09-10: qty 30, 5d supply, fill #0

## 2023-09-10 NOTE — Progress Notes (Signed)
 Secure chat sent to Dr Ashok Pall regarding family questions about d/c. Pt's daughters asked about echo test this afternoon at 3pm - per primary nurse echo was canceled per Dr Ashok Pall. TOC  meds in a secure bag delivered by this RN to pt in room.

## 2023-09-10 NOTE — Progress Notes (Signed)
 Daily Progress Note   Patient Name: Allison [name]       Date: 09/10/2023 DOB: 1950-10-26  Age: 73 y.o. MRN#: [id number] Attending Physician: Kathrynn Running, MD Primary Care Physician: [name], MD Admit Date: 09/07/2023  Reason for Consultation/Follow-up: Pain control and cough, shortness of breath.   Subjective:  Awake alert,2 daughters at bedside, another daughter on phone.   Length of Stay: 3  Current Medications: Scheduled Meds:   benzonatate  200 mg Oral TID   carvedilol  12.5 mg Oral BID WC   Chlorhexidine Gluconate Cloth  6 each Topical Daily   cholecalciferol  5,000 Units Oral Daily   docusate sodium  100 mg Oral BID   dorzolamide-timolol  1 drop Both Eyes BID   enoxaparin (LOVENOX) injection  55 mg Subcutaneous Q12H   ezetimibe  10 mg Oral Daily   famotidine  20 mg Oral BID   FLUoxetine  20 mg Oral Daily   gabapentin  200 mg Oral QHS   isosorbide mononitrate  15 mg Oral Daily   nystatin  5 mL Oral QID   polyethylene glycol  17 g Oral Daily   sodium chloride flush  10-40 mL Intracatheter Q12H    Continuous Infusions:    PRN Meds: acetaminophen, ALPRAZolam, bisacodyl, fentaNYL (SUBLIMAZE) injection, HYDROcodone bit-homatropine, hyoscyamine, iohexol, ipratropium-albuterol, ondansetron **OR** ondansetron (ZOFRAN) IV, oxyCODONE, sodium chloride flush  Physical Exam         Less cough today Mild distress Shortness of breath Generalized weakness  Vital Signs: BP 130/88 (BP Location: Right Arm)   Pulse 89   Temp 97.9 F (36.6 C)   Resp 19   Ht 5\' 4"  (1.626 m)   Wt 56.2 kg   LMP  (LMP Unknown)   SpO2 94%   BMI 21.28 kg/m  SpO2: SpO2: 94 % O2 Device: O2 Device: Room Air O2 Flow Rate:    Intake/output summary:  Intake/Output Summary (Last 24  hours) at 09/10/2023 1151 Last data filed at 09/09/2023 1804 Gross per 24 hour  Intake 256.33 ml  Output --  Net 256.33 ml   LBM: Last BM Date : 09/09/23 (small pebble per pt's daughter) Baseline Weight: Weight: 56.2 kg Most recent weight: Weight: 56.2 kg       Palliative Assessment/Data:      Patient Active Problem  List   Diagnosis Date Noted   Palliative care by specialist 09/09/2023   Acute bilateral deep vein thrombosis (DVT) of upper extremities (HCC) 09/07/2023   Abnormal glucose level 01/18/2022   Anemia 10/07/2021   Neutropenia (HCC) 10/07/2021   Pseudophakia of right eye 09/22/2021   Port-A-Cath in place 07/07/2021   Protein-calorie malnutrition, severe 04/25/2021   Pneumothorax after biopsy 04/21/2021   Lung nodule 04/01/2021   Epiretinal membrane, right eye 02/28/2021   Nuclear sclerotic cataract of left eye 02/28/2021   Coronary artery disease of native artery of native heart with stable angina pectoris (HCC)    CVA (cerebral vascular accident) (HCC) 02/12/2021   Current use of long term anticoagulation 02/12/2021   Hypokalemia 02/12/2021   Leg DVT (deep venous thromboembolism), acute, bilateral (HCC) 02/08/2021   Aortic atherosclerosis (HCC) 02/08/2021   Respiratory failure (HCC) 02/08/2021   Postobstructive pneumonia 02/07/2021   Pulmonary embolism (HCC) 02/07/2021   Elevated troponin 02/07/2021   Encounter to establish care 02/02/2021   Shortness of breath 02/02/2021   Cough 02/02/2021   Statin myopathy 12/03/2020   Acute non-recurrent frontal sinusitis 11/18/2020   NSCLC with EGFR mutation (HCC) 06/30/2020   Hx of chest tube placement 02/11/2020   History of chest tube placement 11/12/2019   Abnormal liver function 08/20/2019   Gastroesophageal reflux disease without esophagitis 08/20/2019   Generalized anxiety disorder 08/20/2019   Glaucoma 08/20/2019   Hyperlipidemia 08/20/2019   Menopausal syndrome 08/20/2019   Raynaud's phenomenon 08/20/2019    Vitamin D deficiency 08/20/2019   Status post coronary artery stent placement    History of non-ST elevation myocardial infarction (NSTEMI) 06/12/2019   Hypertension 03/24/2019   Encounter for antineoplastic chemotherapy 01/22/2018   Adenocarcinoma of right lung, stage 4 (HCC) 01/04/2018   Goals of care, counseling/discussion 01/04/2018   Thyroid nodule 12/11/2017   Pleural effusion, right 12/11/2017   Heel pain 03/29/2015   Insomnia 04/20/2014    Palliative Care Assessment & Plan   Patient Profile:    Assessment:  73 y.o. female with medical history significant of stage IV lung cancer treated with chemoradiation since 2019, now with recurrent lung cancer, metastatic lymphadenopathy and malignant right-sided pleural effusion, DVTs and history of stroke currently on Lovenox at home 80 mg daily.  She presented to the emergency room with coughing, shortness of breath, swelling of the left arm, left anterior chest wall.  Patient was being considered to start chemotherapy, however this was postponed due to her clinical status.  She has been followed by oncologist Dr. [name]. Upper extremity ultrasound bilateral showed,Acute DVT right subclavian, right axillary and right brachial veins, age-indeterminate DVT right internal jugular.  She also has left-sided DVT in the internal jugular subclavian and brachial veins.    Recommendations/Plan: Add Gabapentin for cough.  Anticipate discharge today, goals not for comfort-care, want chemotherapy, will request PMT at [facility] to see.     Code Status:    Code Status Orders  (From admission, onward)           Start     Ordered   09/07/23 1732  Do not attempt resuscitation (DNR)- Limited -Do Not Intubate (DNI)  Continuous       Question Answer Comment  If pulseless and not breathing No CPR or chest compressions.   In Pre-Arrest Conditions (Patient Is Breathing and Has A Pulse) Do not intubate. Provide all appropriate non-invasive medical  interventions. Avoid ICU transfer unless indicated or required.   Consent: Discussion documented in EHR or advanced  directives reviewed      09/07/23 1732           Code Status History     Date Active Date Inactive Code Status Order ID Comments User Context   04/23/2021 1232 04/26/2021 2117 DNR [id number]  Ernie Avena, NP Inpatient   04/21/2021 1244 04/23/2021 1232 Full Code [id number]  Talitha Givens, NP Inpatient   02/12/2021 0218 02/16/2021 1950 Full Code [id number]  Chotiner, Claudean Severance, MD ED   02/07/2021 2025 02/10/2021 2056 Full Code [id number]  Therisa Doyne, MD ED   06/13/2019 0028 06/17/2019 1749 Full Code [id number]  Milana Na, DO Inpatient      Advance Directive Documentation    Flowsheet Row Most Recent Value  Type of Advance Directive Living will  Pre-existing out of facility DNR order (yellow form or pink MOST form) --  "MOST" Form in Place? --       Prognosis:  guarded  Discharge Planning: Home with home health today.   Care plan was discussed with patient and daughters. Also discussed with [facility] TOC and palliative colleagues at cancer center.   Thank you for allowing the Palliative Medicine Team to assist in the care of this patient.  Mod MDM.      Greater than 50%  of this time was spent counseling and coordinating care related to the above assessment and plan.  Rosalin Hawking, MD  Please contact Palliative Medicine Team phone at [phone] for questions and concerns.

## 2023-09-10 NOTE — TOC Transition Note (Signed)
 Transition of Care [facility]) - Discharge Note   Patient Details  Name: Allison [name] MRN: [id number] Date of Birth: 03-May-1951  [facility] CM/SW Contact:  Otelia Santee, LCSW Phone Number: 09/10/2023, 1:19 PM   Clinical Narrative:    Pt to return home with daughter. Family requested home health nursing services. HHRN has been arranged with [facility]. HH order in place. Pt's daughter asking about obtaining aquatherma, BSC, and narrow wheelchair. Daughter informed insurance does not cover aquatherma but, could obtain one privately online. WC and BSC ordered through [employer] for home delivery. Awaiting word from [employer] if insurance will cover this DME.    Final next level of care: Home w Home Health Services Barriers to Discharge: No Barriers Identified   Patient Goals and CMS Choice Patient states their goals for this hospitalization and ongoing recovery are:: To return home CMS Medicare.gov Compare Post Acute Care list provided to:: Patient Choice offered to / list presented to : Patient [place] ownership interest in [facility].provided to::  (NA)    Discharge Placement                       Discharge Plan and Services Additional resources added to the After Visit Summary for                  DME Arranged: Wheelchair manual, Bedside commode DME Agency: [employer] Date DME Agency Contacted: 09/10/23 Time DME Agency Contacted: 1319 Representative spoke with at DME Agency: Zack HH Arranged: RN HH Agency: [facility] Date [facility] Agency Contacted: 09/10/23 Time HH Agency Contacted: 1319 Representative spoke with at [facility] Agency: Elnita Maxwell  Social Drivers of Health (SDOH) Interventions SDOH Screenings   Food Insecurity: No Food Insecurity (09/07/2023)  Housing: Low Risk  (09/07/2023)  Transportation Needs: No Transportation Needs (09/07/2023)  Utilities: Not At Risk (09/07/2023)  Depression (PHQ2-9): Medium Risk (09/14/2022)  Financial  Resource Strain: Low Risk  (07/22/2019)  Physical Activity: Sufficiently Active (07/22/2019)  Social Connections: Socially Integrated (09/07/2023)  Tobacco Use: Low Risk  (09/07/2023)     Readmission Risk Interventions    09/10/2023    1:18 PM 09/10/2023   10:20 AM  Readmission Risk Prevention Plan  Transportation Screening Complete Complete  PCP or Specialist Appt within 3-5 Days  Complete  HRI or Home Care Consult  Complete  Social Work Consult for Recovery Care Planning/Counseling  Complete  Palliative Care Screening  Complete  Medication Review [employer])  Complete

## 2023-09-10 NOTE — Progress Notes (Signed)
 PHARMACY - ANTICOAGULATION CONSULT NOTE  Pharmacy Consult for Lovenox Indication: hx CVA and DVT/PE; new acute Bilateral UE DVTs (on full dose LMWH PTA)   Allergies  Allergen Reactions   Hydrocodone-Acetaminophen Other (See Comments)    Irregular heart rate Can tolerate Hycodan in 2025.   Other Other (See Comments)    No cold water = triggers coughing!!  Unnamed glaucoma eye drop - turned eyes dark   Penicillins Other (See Comments)    SYNCOPE PATIENT HAS HAD A PCN REACTION WITH IMMEDIATE RASH, FACIAL/TONGUE/THROAT SWELLING, SOB, OR LIGHTHEADEDNESS WITH HYPOTENSION: Yes Has patient had a PCN reaction causing severe rash involving mucus membranes or skin necrosis: No Has patient had a PCN reaction that required hospitalization: No Has patient had a PCN reaction occurring within the last 10 years: No   Lactose Other (See Comments)    Allergy to all dairy products Abdominal cramping   Vicodin [Hydrocodone-Acetaminophen] Other (See Comments)    Sped up heart and breathing (tolerates Hycodan in 2025)   Bacid Nausea And Vomiting   Ipratropium-Albuterol Cough    Patient Measurements: Height: 5\' 4"  (162.6 cm) Weight: 56.2 kg (124 lb) IBW/kg (Calculated) : 54.7 Heparin Dosing Weight: 56 kg  Vital Signs: Temp: 97.9 F (36.6 C) (03/24 0522) BP: 130/88 (03/24 0522) Pulse Rate: 89 (03/24 0522)  Labs: Recent Labs    09/07/23 1302 09/08/23 0212 09/08/23 1527 09/09/23 0358 09/10/23 0252  HGB 11.6* 11.3*  --   --  10.2*  HCT 37.6 35.4*  --   --  33.4*  PLT 125* 119*  --   --  133*  CREATININE 1.09*  --  1.26* 1.36* 1.11*    Estimated Creatinine Clearance: 39.6 mL/min (A) (by C-G formula based on SCr of 1.11 mg/dL (H)).   Medical History: Past Medical History:  Diagnosis Date   Anemia    Anxiety    Arthritis    Asthma    exercise induced   Coronary artery disease    Depression    PMH   Dyspnea    GERD (gastroesophageal reflux disease)    Glaucoma    History of  radiation therapy 01/05/2021   IMRT right lung  11/24/2020-01/05/2021  Dr Antony Blackbird   Hypertension    lung ca dx'd 11/2017   right   Malignant pleural effusion    right   Nuclear sclerotic cataract of right eye 02/28/2021   Dr. Sallye Lat, cataract surgery February 2023   PONV (postoperative nausea and vomiting)    Pre-diabetes    Raynaud's disease    Raynaud's disease    Stroke (HCC) 01/2021   balance off, some express aphasia, weakness    Medications:  - Per pt, on LMWH 80 mg q24h PTA with last dose taken on 09/06/23 at 6:30p.  She reported forgetting to take her dose on 3/18 and 3/19 as well as forgetting to take 1-2 doses per week for the past month.   Assessment: Patient is a 73 y.o F with NSCLC and hx CVA and PE/DVT on full dose LMWH PTA, who presented to the ED on 09/07/23 with c/o cough, SOB, and LUE swelling.  She reported missing 1-2 doses of her LMWH per week for the past month.  UE doppler on 09/07/23 showed acute extensive bilateral UE DVT. She is currently on lovenox for VTE treatment.  Today, 09/10/2023: - cbc somewhat stable - no bleeding documented  - scr 1.11 (crcl ~40)  Goal of Therapy:  Anti-Xa level 0.6-1 units/ml 4hrs  after LMWH dose given Monitor platelets by anticoagulation protocol: Yes   Plan:  - Continue Lovenox 55 mg SQ q12h for now - monitor for s/sx bleeding   Allison [name] P 09/10/2023,9:52 AM

## 2023-09-10 NOTE — Plan of Care (Signed)
  Problem: Education: Goal: Knowledge of General Education information will improve Description: Including pain rating scale, medication(s)/side effects and non-pharmacologic comfort measures Outcome: Progressing   Problem: Health Behavior/Discharge Planning: Goal: Ability to manage health-related needs will improve Outcome: Progressing   Problem: Clinical Measurements: Goal: Respiratory complications will improve Outcome: Progressing   Problem: Activity: Goal: Risk for activity intolerance will decrease Outcome: Progressing   Problem: Coping: Goal: Level of anxiety will decrease Outcome: Progressing   Problem: Pain Managment: Goal: General experience of comfort will improve and/or be controlled Outcome: Progressing

## 2023-09-10 NOTE — Progress Notes (Signed)
   09/10/23 1021  TOC Brief Assessment  Insurance and Status Reviewed  Patient has primary care physician Yes  Home environment has been reviewed Home w/ family  Prior level of function: modified independent  Prior/Current Home Services No current home services  Social Drivers of Health Review SDOH reviewed no interventions necessary  Readmission risk has been reviewed Yes  Transition of care needs no transition of care needs at this time   Pt to return home with family. Pt followed by palliative care at [facility]. No current TOC needs identified.

## 2023-09-10 NOTE — Discharge Summary (Signed)
 Allison [name] VWU:[id number] DOB: [birth year] DOA: 09/07/2023  PCP: [name], MD  Admit date: 09/07/2023 Discharge date: 09/10/2023  Time spent: 35 minutes  Recommendations for Outpatient Follow-up:  Oncology f/u tomorrow for chemotherapy     Discharge Diagnoses:  Principal Problem:   Acute bilateral deep vein thrombosis (DVT) of upper extremities (HCC) Active Problems:   Adenocarcinoma of right lung, stage 4 (HCC)   Hypertension   History of non-ST elevation myocardial infarction (NSTEMI)   Hyperlipidemia   Pulmonary embolism (HCC)   Coronary artery disease of native artery of native heart with stable angina pectoris [facility])   Palliative care by specialist   Discharge Condition: stable  Diet recommendation: heart healthy  Filed Weights   09/07/23 1027  Weight: 56.2 kg    History of present illness:  From admission h and p Allison [name] is a 73 y.o. female with medical history significant of stage IV lung cancer treated with chemoradiation since 2019, now with recurrent lung cancer, metastatic lymphadenopathy and malignant right-sided pleural effusion, multiple DVTs and history of stroke currently on Lovenox at home 80 mg daily presented to the emergency room with coughing after taking medications, swelling of the left arm, feeling swelling of the left anterior chest wall, shortness of breath and just not feeling well.  Patient stated that she was recently found to have recurrent cancer and is planning to start chemotherapy, however this was postponed because of patient not looking good.  She was supposed to have seen at infusion center today to give IV fluids with plan to start chemotherapy next week.  Patient has chronic loss of appetite.  She does have chronic shortness of breath but it is worse now.  Arm swelling has been recently noted.  Patient without any fever.  She has dry cough and uses Hycodan syrup.  Does not have any lower extremity swelling.  Pain is mostly on  the anterior chest wall sometimes worse with deep breathing.   Hospital Course:  Patient presents with swelling of bilateral upper extremities. Found to have acute dvt of right subclavian, axillary, and brachial veins as well as acute dvt of left internal jugular, subclavian, and left brachial pains. Also with cancer-related pain. No hypoxia. She reports she has not been compliant with her home lovenox (prior history of DVT). Discussed case w/ Dr. [name], he advises treating this as secondary to med noncompliance and advises continuing lovenox, though we will change to twice daily dosing instead of the prior once daily. Patient wants to proceed with outpatient chemotherapy plan, which is scheduled for tomorrow. Dr. [name] said that will be able to proceed if the patient is discharged today. There are no other barriers to discharge so orders placed, will start as needed oxycodone for worsening cancer pain, can follow up with her oncology team for further pain management. Palliative care did meet with the patient and her family while here. Home amoldipine held as BPs here are wnl while holding that med.  Procedures: none   Consultations: none  Discharge Exam: Vitals:   09/09/23 1906 09/10/23 0522  BP: (!) 147/80 130/88  Pulse: 77 89  Resp: 15 19  Temp: 98.6 F (37 C) 97.9 F (36.6 C)  SpO2: 97% 94%    General: NAD Cardiovascular: RRR Respiratory: Scattered rhonchi  Discharge Instructions   Discharge Instructions     Diet - low sodium heart healthy   Complete by: As directed    Increase activity slowly   Complete by: As directed  Allergies as of 09/10/2023       Reactions   Hydrocodone-acetaminophen Other (See Comments)   Irregular heart rate Can tolerate Hycodan in 2025.   Other Other (See Comments)   No cold water = triggers coughing!! Unnamed glaucoma eye drop - turned eyes dark   Penicillins Other (See Comments)   SYNCOPE PATIENT HAS HAD A PCN REACTION WITH  IMMEDIATE RASH, FACIAL/TONGUE/THROAT SWELLING, SOB, OR LIGHTHEADEDNESS WITH HYPOTENSION: Yes Has patient had a PCN reaction causing severe rash involving mucus membranes or skin necrosis: No Has patient had a PCN reaction that required hospitalization: No Has patient had a PCN reaction occurring within the last 10 years: No   Lactose Other (See Comments)   Allergy to all dairy products Abdominal cramping   Vicodin [hydrocodone-acetaminophen] Other (See Comments)   Sped up heart and breathing (tolerates Hycodan in 2025)   Bacid Nausea And Vomiting   Ipratropium-albuterol Cough        Medication List     PAUSE taking these medications    amLODipine 5 MG tablet Wait to take this until your doctor or other care provider tells you to start again. Commonly known as: NORVASC Take 1 tablet (5 mg total) by mouth daily.       STOP taking these medications    doxycycline 100 MG capsule Commonly known as: VIBRAMYCIN   levofloxacin 750 MG tablet Commonly known as: Levaquin       TAKE these medications    acetaminophen 500 MG tablet Commonly known as: TYLENOL Take 1,000 mg by mouth every 6 (six) hours as needed (pain).   ALPRAZolam 0.25 MG tablet Commonly known as: XANAX Take 1 tablet (0.25 mg total) by mouth 2 (two) times daily as needed for anxiety. May take 2 tablets (0.5 mg total) at bedtime. What changed:  how much to take reasons to take this additional instructions   Benefiber Drink Mix Pack Take 1 packet by mouth daily.   benzonatate 100 MG capsule Commonly known as: TESSALON Take 2 capsules (200 mg total) by mouth 3 (three) times daily as needed for cough.   Bevespi Aerosphere 9-4.8 MCG/ACT Aero Generic drug: Glycopyrrolate-Formoterol Inhale 2 puffs into the lungs 2 (two) times daily.   carvedilol 12.5 MG tablet Commonly known as: COREG Take 1 tablet (12.5 mg total) by mouth 2 (two) times daily with a meal.   CHELATED MAGNESIUM PO Take 200 mg by mouth  daily.   dexamethasone 4 MG tablet Commonly known as: DECADRON Take 1 tablet twice a day the day before, day of, and day after treatment   docusate sodium 100 MG capsule Commonly known as: COLACE Take 1 capsule (100 mg total) by mouth 2 (two) times daily.   dorzolamide-timolol 2-0.5 % ophthalmic solution Commonly known as: COSOPT Place 1 drop into both eyes 2 (two) times daily.   enoxaparin 60 MG/0.6ML injection Commonly known as: LOVENOX Inject 0.55 mLs (55 mg total) into the skin every 12 (twelve) hours. PLEASE PROVIDE A 90 DAY SUPPLY What changed:  medication strength how much to take when to take this additional instructions   ezetimibe 10 MG tablet Commonly known as: ZETIA Take 10 mg by mouth daily.   FLUoxetine 20 MG capsule Commonly known as: PROZAC Take 1 capsule (20 mg total) by mouth daily.   folic acid 1 MG tablet Commonly known as: FOLVITE TAKE 1 TABLET BY MOUTH EVERY DAY   Folivane-Plus Caps Take 1 capsule by mouth in the morning. What changed: when to take  this   gabapentin 250 MG/5ML solution Commonly known as: NEURONTIN Take 4 mLs (200 mg total) by mouth at bedtime.   HYDROcodone bit-homatropine 5-1.5 MG/5ML syrup Commonly known as: HYCODAN Take 5 mLs by mouth every 6 (six) hours as needed for cough.   ipratropium-albuterol 0.5-2.5 (3) MG/3ML Soln Commonly known as: DUONEB Take 3 mLs by nebulization every 6 (six) hours as needed (Asthma).   isosorbide mononitrate 30 MG 24 hr tablet Commonly known as: IMDUR Take 0.5 tablets (15 mg total) by mouth daily.   latanoprost 0.005 % ophthalmic solution Commonly known as: XALATAN Place 1 drop into both eyes at bedtime.   nystatin 100000 UNIT/ML suspension Commonly known as: MYCOSTATIN Take 5 mLs (500,000 Units total) by mouth 4 (four) times daily.   ondansetron 4 MG disintegrating tablet Commonly known as: ZOFRAN-ODT Take 1 tablet (4 mg total) by mouth every 8 (eight) hours as needed for nausea  or vomiting. What changed: reasons to take this   Oscimin 0.125 MG SL tablet Generic drug: hyoscyamine Place 1-2 tablets (0.125-0.25mg ) under the tongue every 6 (six) hours as needed for cramping.   osimertinib mesylate 80 MG tablet Commonly known as: TAGRISSO Take 1 tablet (80 mg total) by mouth daily.   oxyCODONE 5 MG immediate release tablet Commonly known as: Oxy IR/ROXICODONE 1-2 tablets every 6 hours as needed   Pepcid AC 10 MG tablet Generic drug: famotidine Take 10 mg by mouth 2 (two) times daily.   predniSONE 10 MG tablet Commonly known as: DELTASONE Take 1 tablet (10 mg total) by mouth daily with breakfast.   prochlorperazine 10 MG tablet Commonly known as: COMPAZINE Take 1 tablet (10 mg total) by mouth every 6 (six) hours as needed. What changed: Another medication with the same name was changed. Make sure you understand how and when to take each.   prochlorperazine 10 MG tablet Commonly known as: COMPAZINE Take 1 tablet (10 mg total) by mouth every 6 (six) hours as needed. What changed: reasons to take this   Repatha SureClick 140 MG/ML Soaj Generic drug: Evolocumab Inject 140 mg into the skin every 14 (fourteen) days.   Vitamin D 125 MCG (5000 UT) Caps Take 1 Capful by mouth daily.       Allergies  Allergen Reactions   Hydrocodone-Acetaminophen Other (See Comments)    Irregular heart rate Can tolerate Hycodan in 2025.   Other Other (See Comments)    No cold water = triggers coughing!!  Unnamed glaucoma eye drop - turned eyes dark   Penicillins Other (See Comments)    SYNCOPE PATIENT HAS HAD A PCN REACTION WITH IMMEDIATE RASH, FACIAL/TONGUE/THROAT SWELLING, SOB, OR LIGHTHEADEDNESS WITH HYPOTENSION: Yes Has patient had a PCN reaction causing severe rash involving mucus membranes or skin necrosis: No Has patient had a PCN reaction that required hospitalization: No Has patient had a PCN reaction occurring within the last 10 years: No   Lactose Other  (See Comments)    Allergy to all dairy products Abdominal cramping   Vicodin [Hydrocodone-Acetaminophen] Other (See Comments)    Sped up heart and breathing (tolerates Hycodan in 2025)   Bacid Nausea And Vomiting   Ipratropium-Albuterol Cough    Follow-up Information     Si Gaul, MD Follow up.   Specialty: Oncology Contact information: [address] [place] [address] [phone]                  The results of significant diagnostics from this hospitalization (including imaging, microbiology,  ancillary and laboratory) are listed below for reference.    Significant Diagnostic Studies: UE VENOUS DUPLEX (7am - 7pm) Result Date: 09/08/2023 UPPER VENOUS STUDY  Patient Name:  Allison [name]  Date of Exam:   09/07/2023 Medical Rec #: [id number]        Accession #:    [id number] Date of Birth: 1950/12/18        Patient Gender: F Patient Age:   67 years Exam Location:  [facility] Procedure:      VAS [place] UPPER EXTREMITY VENOUS DUPLEX Referring Phys: Alvino Blood --------------------------------------------------------------------------------  Indications: Swelling Risk Factors: History of PE (2022) Lung Cancer / Chemotherapy / Right side port placement. Anticoagulation: Lovenox. Comparison Study: No previous UE venous exams Performing Technologist: Jody Hill RVT, RDMS  Examination Guidelines: A complete evaluation includes B-mode imaging, spectral Doppler, color Doppler, and power Doppler as needed of all accessible portions of each vessel. Bilateral testing is considered an integral part of a complete examination. Limited examinations for reoccurring indications may be performed as noted.  Right Findings: +----------+------------+---------+-----------+----------+-----------------+ RIGHT     CompressiblePhasicitySpontaneousProperties     Summary      +----------+------------+---------+-----------+----------+-----------------+ IJV         Partial       Yes       Yes              Age Indeterminate +----------+------------+---------+-----------+----------+-----------------+ Subclavian    None       No        No                     Acute       +----------+------------+---------+-----------+----------+-----------------+ Axillary      None       No        No                     Acute       +----------+------------+---------+-----------+----------+-----------------+ Brachial      None       No        No                     Acute       +----------+------------+---------+-----------+----------+-----------------+ Radial        Full                                                    +----------+------------+---------+-----------+----------+-----------------+ Ulnar                                                patent by color  +----------+------------+---------+-----------+----------+-----------------+ Cephalic      None       No        No                     Acute       +----------+------------+---------+-----------+----------+-----------------+ Basilic       None       No        No                     Acute       +----------+------------+---------+-----------+----------+-----------------+  Left Findings: +----------+------------+---------+-----------+----------+-----------------+ LEFT      CompressiblePhasicitySpontaneousProperties     Summary      +----------+------------+---------+-----------+----------+-----------------+ IJV           None       No        No                     Acute       +----------+------------+---------+-----------+----------+-----------------+ Subclavian    None       No        No                     Acute       +----------+------------+---------+-----------+----------+-----------------+ Axillary      None       No        No               Age Indeterminate +----------+------------+---------+-----------+----------+-----------------+ Brachial      None       No         No                     Acute       +----------+------------+---------+-----------+----------+-----------------+ Radial        Full                                                    +----------+------------+---------+-----------+----------+-----------------+ Ulnar         Full                                                    +----------+------------+---------+-----------+----------+-----------------+ Cephalic      Full                                                    +----------+------------+---------+-----------+----------+-----------------+ Basilic       None       No        No               Age Indeterminate +----------+------------+---------+-----------+----------+-----------------+  Summary:  Right: Findings consistent with acute deep vein thrombosis involving the right subclavian vein, right axillary vein and right brachial veins. Findings consistent with acute superficial vein thrombosis involving the right basilic vein and right cephalic vein. Findings consistent with age indeterminate deep vein thrombosis involving the right internal jugular vein.  Left: Findings consistent with acute deep vein thrombosis involving the left internal jugular vein, left subclavian vein and left brachial veins. Findings consistent with age indeterminate deep vein thrombosis involving the left axillary vein. Findings consistent with age indeterminate superficial vein thrombosis involving the left basilic vein.  *See table(s) above for measurements and observations.  Diagnosing physician: Lemar Livings MD Electronically signed by Lemar Livings MD on 09/08/2023 at 9:38:23 AM.    Final    CT Chest Wo Contrast Result Date: 09/07/2023 CLINICAL DATA:  History of right lung cancer presenting with chest pain after choking episode on cough syrup associated with shortness of breath. *  Tracking Code: BO * EXAM: CT CHEST WITHOUT CONTRAST TECHNIQUE: Multidetector CT imaging of the chest was performed  following the standard protocol without IV contrast. RADIATION DOSE REDUCTION: This exam was performed according to the departmental dose-optimization program which includes automated exposure control, adjustment of the mA and/or kV according to patient size and/or use of iterative reconstruction technique. COMPARISON:  Nuclear medicine PET dated 08/13/2023, CT chest dated 07/25/2023 FINDINGS: Cardiovascular: Right chest wall port terminates at the superior cavoatrial junction. Normal heart size. No significant pericardial fluid/thickening. Great vessels are normal in course and caliber. Coronary artery calcifications and aortic atherosclerosis. Mediastinum/Nodes: Imaged thyroid gland without nodules meeting criteria for imaging follow-up by size. Normal esophagus. Unchanged 12 mm subcarinal lymph node (2:25) Lungs/Pleura: The central airways are patent. Similar asymmetric narrowing of the right bronchi and bronchioles secondary to encasement by the peribronchovascular consolidation, which has increased compared to 08/13/2023. Interval increased nodular interlobular septal thickening throughout the right lung. New mild interlobular septal thickening of the left lung. New 1.4 x 1.4 cm left lower lobe nodule (4:119) contains internal hypodensities. New 8 mm lateral left apical nodule (4:22) and scattered ground-glass nodules, predominantly within the left upper lobe. No pneumothorax. Increased small right and moderate left pleural effusions. Upper abdomen: Normal. Musculoskeletal: Age indeterminate superior endplate compression deformity of T7, new compared to 07/25/2023. IMPRESSION: 1. Increased peribronchovascular consolidation throughout the right lung with increased nodular interlobular septal thickening throughout the right lung, suspicious for progression of disease. 2. New 1.4 cm left lower lobe nodule contains internal hypodensities, indeterminate which may represent metastatic disease or a focus of lipoid  pneumonia given reported aspiration. 3. New 8 mm lateral left apical nodule and scattered ground-glass nodules, predominantly within the left upper lobe, which may be infectious/inflammatory or metastatic. 4. Increased small right and moderate left pleural effusions. 5. Age indeterminate superior endplate compression deformity of T7, new compared to 07/25/2023. Aortic Atherosclerosis (ICD10-I70.0). Coronary artery calcifications. Assessment for potential risk factor modification, dietary therapy or pharmacologic therapy may be warranted, if clinically indicated. Electronically Signed   By: Agustin Cree M.D.   On: 09/07/2023 14:24   DG Chest Port 1 View Result Date: 09/07/2023 CLINICAL DATA:  Cough. EXAM: PORTABLE CHEST 1 VIEW COMPARISON:  08/06/2023. FINDINGS: Since the prior study, there is increasing opacification of right hemithorax with shift of mediastinal structures to the right. There is at least moderate layering right pleural effusion with associated compressive atelectatic changes in the right lung. Left lung and left lateral costophrenic angle are clear. No pneumothorax on either side. Evaluation of cardiomediastinal silhouette is nondiagnostic due to right lower hemithorax opacification. No acute osseous abnormalities. The soft tissues are within normal limits. Right-sided CT Port-A-Cath is seen with its tip overlying the midportion of superior vena cava, unchanged. IMPRESSION: Increasing opacification of right hemithorax with shift of mediastinal structures to the right. There is at least moderate layering right pleural effusion with associated compressive atelectatic changes in the right lung. Electronically Signed   By: Jules Schick M.D.   On: 09/07/2023 11:48   NM PET Image Initial (PI) Skull Base To Thigh Result Date: 08/16/2023 CLINICAL DATA:  Subsequent treatment strategy for small cell lung cancer. EXAM: NUCLEAR MEDICINE PET SKULL BASE TO THIGH TECHNIQUE: 6.5 mCi F-18 FDG was injected  intravenously. Full-ring PET imaging was performed from the skull base to thigh after the radiotracer. CT data was obtained and used for attenuation correction and anatomic localization. Fasting blood glucose: 109 mg/dl COMPARISON:  57/84/6962. FINDINGS:  Mediastinal blood pool activity: SUV max 3.0 Liver activity: SUV max NA NECK: No abnormal hypermetabolism. Incidental CT findings: None. CHEST: Mildly hypermetabolic mediastinal lymph nodes. Index subcarinal lymph node measures approximately 1.3 cm, SUV max 5.6, compared to 8.6 previously. Mildly hypermetabolic parenchymal consolidation and retraction in the apical and medial upper right hemithorax. SUV max 6.9 in the central right lower lobe (4/73). No additional abnormal hypermetabolism. Incidental CT findings: Right IJ Port-A-Cath terminates at the SVC RA junction or high right atrium. Atherosclerotic calcification of the aorta. Heart is at the upper limits of normal in size. Perilymphatic nodularity and septal thickening throughout the right upper, right middle and right lower lobes with a thin rind of mildly hypermetabolic pleural fluid/thickening in the right hemithorax. Trace left pleural effusion. ABDOMEN/PELVIS: No abnormal hypermetabolism. Incidental CT findings: None. SKELETON: No abnormal hypermetabolism. Incidental CT findings: Degenerative changes in the spine. IMPRESSION: 1. Post treatment parenchymal consolidation and retraction with hypermetabolism in the central right lower lobe, worrisome for disease recurrence. Perilymphatic nodularity and thickening in the right hemithorax, most indicative of lymphangitic carcinomatosis. Associated hypermetabolic mediastinal lymph nodes. 2. Likely malignant rind of pleural fluid/thickening in the right hemithorax. 3. Trace left pleural fluid. 4.  Aortic atherosclerosis (ICD10-I70.0). Electronically Signed   By: Leanna Battles M.D.   On: 08/16/2023 14:22    Microbiology: No results found for this or any  previous visit (from the past 240 hours).   Labs: Basic Metabolic Panel: Recent Labs  Lab 09/04/23 1107 09/07/23 1302 09/08/23 1527 09/09/23 0358 09/10/23 0252  NA 133* 137 137 138 137  K 4.1 3.6 4.1 3.9 4.0  CL 100 105 107 107 108  CO2 24 23 23 22 22   GLUCOSE 141* 110* 141* 96 103*  BUN 28* 15 19 22 20   CREATININE 1.65* 1.09* 1.26* 1.36* 1.11*  CALCIUM 9.4 9.0 9.2 9.1 8.9   Liver Function Tests: Recent Labs  Lab 09/04/23 1107 09/07/23 1302 09/08/23 1527 09/09/23 0358  AST 22 21 25 23   ALT 14 18 19 18   ALKPHOS 72 59 54 52  BILITOT 0.3 0.5 0.5 0.2  PROT 6.8 6.0* 5.7* 5.6*  ALBUMIN 3.6 2.9* 2.8* 2.7*   No results for input(s): "LIPASE", "AMYLASE" in the last 168 hours. No results for input(s): "AMMONIA" in the last 168 hours. CBC: Recent Labs  Lab 09/04/23 1107 09/07/23 1302 09/08/23 0212 09/10/23 0252  WBC 11.9* 9.5 8.5 8.2  NEUTROABS 10.2* 7.9*  --   --   HGB 12.5 11.6* 11.3* 10.2*  HCT 38.5 37.6 35.4* 33.4*  MCV 80.7 84.7 84.1 85.6  PLT 154 125* 119* 133*   Cardiac Enzymes: No results for input(s): "CKTOTAL", "CKMB", "CKMBINDEX", "TROPONINI" in the last 168 hours. BNP: BNP (last 3 results) No results for input(s): "BNP" in the last 8760 hours.  ProBNP (last 3 results) No results for input(s): "PROBNP" in the last 8760 hours.  CBG: No results for input(s): "GLUCAP" in the last 168 hours.     Signed:  Silvano Bilis MD.  [facility] 09/10/2023, 10:12 AM

## 2023-09-10 NOTE — Plan of Care (Signed)

## 2023-09-11 ENCOUNTER — Inpatient Hospital Stay

## 2023-09-11 ENCOUNTER — Encounter: Payer: Self-pay | Admitting: Nurse Practitioner

## 2023-09-11 ENCOUNTER — Encounter: Payer: Self-pay | Admitting: Internal Medicine

## 2023-09-11 ENCOUNTER — Inpatient Hospital Stay (HOSPITAL_BASED_OUTPATIENT_CLINIC_OR_DEPARTMENT_OTHER): Admitting: Nurse Practitioner

## 2023-09-11 ENCOUNTER — Other Ambulatory Visit (HOSPITAL_COMMUNITY): Payer: Self-pay

## 2023-09-11 ENCOUNTER — Encounter: Payer: Self-pay | Admitting: Physician Assistant

## 2023-09-11 ENCOUNTER — Inpatient Hospital Stay (HOSPITAL_BASED_OUTPATIENT_CLINIC_OR_DEPARTMENT_OTHER): Admitting: Physician Assistant

## 2023-09-11 ENCOUNTER — Other Ambulatory Visit: Payer: Self-pay

## 2023-09-11 VITALS — BP 183/77 | HR 67 | Temp 97.2°F | Resp 16 | Wt 132.6 lb

## 2023-09-11 VITALS — BP 179/82

## 2023-09-11 DIAGNOSIS — Z515 Encounter for palliative care: Secondary | ICD-10-CM | POA: Diagnosis not present

## 2023-09-11 DIAGNOSIS — C3491 Malignant neoplasm of unspecified part of right bronchus or lung: Secondary | ICD-10-CM | POA: Diagnosis not present

## 2023-09-11 DIAGNOSIS — Z5111 Encounter for antineoplastic chemotherapy: Secondary | ICD-10-CM | POA: Diagnosis not present

## 2023-09-11 DIAGNOSIS — K5903 Drug induced constipation: Secondary | ICD-10-CM | POA: Diagnosis not present

## 2023-09-11 DIAGNOSIS — R530 Neoplastic (malignant) related fatigue: Secondary | ICD-10-CM

## 2023-09-11 DIAGNOSIS — Z7901 Long term (current) use of anticoagulants: Secondary | ICD-10-CM | POA: Diagnosis not present

## 2023-09-11 DIAGNOSIS — C3490 Malignant neoplasm of unspecified part of unspecified bronchus or lung: Secondary | ICD-10-CM

## 2023-09-11 DIAGNOSIS — Z7952 Long term (current) use of systemic steroids: Secondary | ICD-10-CM | POA: Diagnosis not present

## 2023-09-11 DIAGNOSIS — J910 Malignant pleural effusion: Secondary | ICD-10-CM | POA: Diagnosis not present

## 2023-09-11 DIAGNOSIS — Z79899 Other long term (current) drug therapy: Secondary | ICD-10-CM | POA: Diagnosis not present

## 2023-09-11 DIAGNOSIS — Z95828 Presence of other vascular implants and grafts: Secondary | ICD-10-CM

## 2023-09-11 DIAGNOSIS — E860 Dehydration: Secondary | ICD-10-CM | POA: Diagnosis not present

## 2023-09-11 DIAGNOSIS — G893 Neoplasm related pain (acute) (chronic): Secondary | ICD-10-CM | POA: Diagnosis not present

## 2023-09-11 DIAGNOSIS — C3411 Malignant neoplasm of upper lobe, right bronchus or lung: Secondary | ICD-10-CM | POA: Diagnosis present

## 2023-09-11 DIAGNOSIS — Z7189 Other specified counseling: Secondary | ICD-10-CM

## 2023-09-11 DIAGNOSIS — R197 Diarrhea, unspecified: Secondary | ICD-10-CM | POA: Diagnosis not present

## 2023-09-11 LAB — CBC WITH DIFFERENTIAL (CANCER CENTER ONLY)
Abs Immature Granulocytes: 0.03 10*3/uL (ref 0.00–0.07)
Basophils Absolute: 0 10*3/uL (ref 0.0–0.1)
Basophils Relative: 0 %
Eosinophils Absolute: 0 10*3/uL (ref 0.0–0.5)
Eosinophils Relative: 0 %
HCT: 37 % (ref 36.0–46.0)
Hemoglobin: 11.9 g/dL — ABNORMAL LOW (ref 12.0–15.0)
Immature Granulocytes: 0 %
Lymphocytes Relative: 9 %
Lymphs Abs: 0.9 10*3/uL (ref 0.7–4.0)
MCH: 26.4 pg (ref 26.0–34.0)
MCHC: 32.2 g/dL (ref 30.0–36.0)
MCV: 82.2 fL (ref 80.0–100.0)
Monocytes Absolute: 0.8 10*3/uL (ref 0.1–1.0)
Monocytes Relative: 8 %
Neutro Abs: 7.8 10*3/uL — ABNORMAL HIGH (ref 1.7–7.7)
Neutrophils Relative %: 83 %
Platelet Count: 160 10*3/uL (ref 150–400)
RBC: 4.5 MIL/uL (ref 3.87–5.11)
RDW: 14.6 % (ref 11.5–15.5)
WBC Count: 9.5 10*3/uL (ref 4.0–10.5)
nRBC: 0 % (ref 0.0–0.2)

## 2023-09-11 LAB — CMP (CANCER CENTER ONLY)
ALT: 15 U/L (ref 0–44)
AST: 17 U/L (ref 15–41)
Albumin: 3.5 g/dL (ref 3.5–5.0)
Alkaline Phosphatase: 65 U/L (ref 38–126)
Anion gap: 6 (ref 5–15)
BUN: 16 mg/dL (ref 8–23)
CO2: 26 mmol/L (ref 22–32)
Calcium: 9.5 mg/dL (ref 8.9–10.3)
Chloride: 106 mmol/L (ref 98–111)
Creatinine: 1.22 mg/dL — ABNORMAL HIGH (ref 0.44–1.00)
GFR, Estimated: 47 mL/min — ABNORMAL LOW (ref >60)
Glucose, Bld: 130 mg/dL — ABNORMAL HIGH (ref 70–99)
Potassium: 4 mmol/L (ref 3.5–5.1)
Sodium: 138 mmol/L (ref 135–145)
Total Bilirubin: 0.3 mg/dL (ref 0.0–1.2)
Total Protein: 6.4 g/dL — ABNORMAL LOW (ref 6.5–8.1)

## 2023-09-11 MED ORDER — OXYCODONE HCL 5 MG PO TABS
5.0000 mg | ORAL_TABLET | ORAL | 0 refills | Status: DC | PRN
  Filled 2023-09-13: qty 60, 5d supply, fill #0

## 2023-09-11 MED ORDER — SODIUM CHLORIDE 0.9% FLUSH
10.0000 mL | Freq: Once | INTRAVENOUS | Status: AC
  Administered 2023-09-11: 10 mL

## 2023-09-11 MED ORDER — SODIUM CHLORIDE 0.9 % IV SOLN: INTRAVENOUS | Status: DC

## 2023-09-11 MED ORDER — PROCHLORPERAZINE MALEATE 10 MG PO TABS
10.0000 mg | ORAL_TABLET | Freq: Once | ORAL | Status: AC
  Administered 2023-09-11: 10 mg via ORAL

## 2023-09-11 MED ORDER — SODIUM CHLORIDE 0.9 % IV SOLN
400.0000 mg/m2 | Freq: Once | INTRAVENOUS | Status: AC
  Administered 2023-09-11: 600 mg via INTRAVENOUS
  Filled 2023-09-11: qty 20

## 2023-09-11 MED ORDER — SODIUM CHLORIDE 0.9% FLUSH
10.0000 mL | INTRAVENOUS | Status: DC | PRN
  Administered 2023-09-11: 10 mL

## 2023-09-11 MED ORDER — HEPARIN SOD (PORK) LOCK FLUSH 100 UNIT/ML IV SOLN
500.0000 [IU] | Freq: Once | INTRAVENOUS | Status: AC | PRN
  Administered 2023-09-11: 500 [IU]

## 2023-09-11 MED ORDER — MORPHINE SULFATE (PF) 2 MG/ML IV SOLN
2.0000 mg | Freq: Once | INTRAVENOUS | Status: AC
  Administered 2023-09-11: 2 mg via INTRAVENOUS
  Filled 2023-09-11: qty 1

## 2023-09-11 MED ORDER — FENTANYL 12 MCG/HR TD PT72
1.0000 | MEDICATED_PATCH | TRANSDERMAL | 0 refills | Status: DC
Start: 2023-09-11 — End: 2023-10-02
  Filled 2023-09-11: qty 10, 30d supply, fill #0

## 2023-09-11 MED ORDER — SENNOSIDES-DOCUSATE SODIUM 8.6-50 MG PO TABS
2.0000 | ORAL_TABLET | Freq: Every day | ORAL | 3 refills | Status: DC
  Filled 2023-09-11: qty 60, 30d supply, fill #0
  Filled 2023-11-13: qty 60, 30d supply, fill #1
  Filled 2023-12-10: qty 60, 30d supply, fill #2
  Filled 2024-01-21: qty 60, 30d supply, fill #3
  Filled 2024-01-23: qty 60, 30d supply, fill #0

## 2023-09-11 NOTE — Progress Notes (Signed)
 Palliative Medicine [facility]  [phone] [phone]   Name: Allison [name] Date: 09/11/2023 MRN: [id number]  DOB: 10-Apr-1951  Patient Care Team: [name], MD as PCP - General (Internal Medicine) Little Ishikawa, MD as PCP - Cardiology (Cardiology) Antony Blackbird, MD as Consulting Physician (Radiation Oncology) Pickenpack-Cousar, Arty Baumgartner, NP as Nurse Practitioner (Nurse Practitioner) Syliva Overman, RN as Oncology Nurse Navigator (Oncology) Si Gaul, MD as Consulting Physician (Oncology) Kermit Balo, DO (Geriatric Medicine) Lytle Butte, RN as Oncology Nurse Navigator    INTERVAL HISTORY:stage IV non-small cell lung cancer (12/2017), hypertension, CVA, CAD, DVT/PE (on Lovenox), and GERD.  Palliative ask to see for symptom management and goals of care.  Allison [name] is a 73 y.o. female with   SOCIAL HISTORY:     reports that she has never smoked. She has never used smokeless tobacco. She reports that she does not currently use alcohol. She reports that she does not use drugs.  ADVANCE DIRECTIVES:  MOST and AD on file   CODE STATUS: DNR  PAST MEDICAL HISTORY: Past Medical History:  Diagnosis Date   Anemia    Anxiety    Arthritis    Asthma    exercise induced   Coronary artery disease    Depression    PMH   Dyspnea    GERD (gastroesophageal reflux disease)    Glaucoma    History of radiation therapy 01/05/2021   IMRT right lung  11/24/2020-01/05/2021  Dr Antony Blackbird   Hypertension    lung ca dx'd 11/2017   right   Malignant pleural effusion    right   Nuclear sclerotic cataract of right eye 02/28/2021   Dr. Sallye Lat, cataract surgery February 2023   PONV (postoperative nausea and vomiting)    Pre-diabetes    Raynaud's disease    Raynaud's disease    Stroke (HCC) 01/2021   balance off, some express aphasia, weakness    ALLERGIES:  is allergic to hydrocodone-acetaminophen,  other, penicillins, lactose, vicodin [hydrocodone-acetaminophen], bacid, and ipratropium-albuterol.  MEDICATIONS:  Current Outpatient Medications  Medication Sig Dispense Refill   fentaNYL (DURAGESIC) 12 MCG/HR Place 1 patch onto the skin every 3 (three) days. 10 patch 0   senna-docusate (SENNA S) 8.6-50 MG tablet Take 2 tablets by mouth at bedtime. 60 tablet 3   acetaminophen (TYLENOL) 500 MG tablet Take 1,000 mg by mouth every 6 (six) hours as needed (pain).     ALPRAZolam (XANAX) 0.25 MG tablet Take 1 tablet (0.25 mg total) by mouth 2 (two) times daily as needed for anxiety. May take 2 tablets (0.5 mg total) at bedtime. (Patient taking differently: Take 0.25-0.5 mg by mouth 2 (two) times daily as needed for anxiety or sleep.) 60 tablet 0   [Paused] amLODipine (NORVASC) 5 MG tablet Take 1 tablet (5 mg total) by mouth daily. 90 tablet 3   benzonatate (TESSALON) 100 MG capsule Take 2 capsules (200 mg total) by mouth 3 (three) times daily as needed for cough. (Patient not taking: Reported on 09/07/2023) 60 capsule 2   carvedilol (COREG) 12.5 MG tablet Take 1 tablet (12.5 mg total) by mouth 2 (two) times daily with a meal. 180 tablet 3   CHELATED MAGNESIUM PO Take 200 mg by mouth daily. (Patient not taking: Reported on 09/07/2023)     Cholecalciferol (VITAMIN D) 125 MCG (5000 UT) CAPS Take 1 Capful by mouth daily. (Patient not taking: Reported on 09/07/2023)  dexamethasone (DECADRON) 4 MG tablet Take 1 tablet twice a day the day before, day of, and day after treatment (Patient not taking: Reported on 09/07/2023) 40 tablet 2   docusate sodium (COLACE) 100 MG capsule Take 1 capsule (100 mg total) by mouth 2 (two) times daily. 60 capsule 5   dorzolamide-timolol (COSOPT) 22.3-6.8 MG/ML ophthalmic solution Place 1 drop into both eyes 2 (two) times daily.     enoxaparin (LOVENOX) 60 MG/0.6ML injection Inject 0.55 mLs (55 mg total) into the skin every 12 (twelve) hours. 100 mL 1   ezetimibe (ZETIA) 10 MG  tablet Take 10 mg by mouth daily.     FeFum-FePoly-FA-B Cmp-C-Biot (FOLIVANE-PLUS) CAPS Take 1 capsule by mouth in the morning. (Patient taking differently: Take 1 capsule by mouth every other day.) 90 capsule 0   FLUoxetine (PROZAC) 20 MG capsule Take 1 capsule (20 mg total) by mouth daily. 90 capsule 3   folic acid (FOLVITE) 1 MG tablet TAKE 1 TABLET BY MOUTH EVERY DAY 90 tablet 1   gabapentin (NEURONTIN) 250 MG/5ML solution Take 4 mLs (200 mg total) by mouth at bedtime. 470 mL 1   Glycopyrrolate-Formoterol (BEVESPI AEROSPHERE) 9-4.8 MCG/ACT AERO Inhale 2 puffs into the lungs 2 (two) times daily. 10.7 g 3   HYDROcodone bit-homatropine (HYCODAN) 5-1.5 MG/5ML syrup Take 5 mLs by mouth every 6 (six) hours as needed for cough. 240 mL 0   hyoscyamine (LEVSIN SL) 0.125 MG SL tablet Place 1-2 tablets (0.125-0.25mg ) under the tongue every 6 (six) hours as needed for cramping. 30 tablet 3   ipratropium-albuterol (DUONEB) 0.5-2.5 (3) MG/3ML SOLN Take 3 mLs by nebulization every 6 (six) hours as needed (Asthma). 360 mL 3   isosorbide mononitrate (IMDUR) 30 MG 24 hr tablet Take 0.5 tablets (15 mg total) by mouth daily. 90 tablet 3   latanoprost (XALATAN) 0.005 % ophthalmic solution Place 1 drop into both eyes at bedtime.      nystatin (MYCOSTATIN) 100000 UNIT/ML suspension Take 5 mLs (500,000 Units total) by mouth 4 (four) times daily. (Patient not taking: Reported on 09/07/2023) 60 mL 0   ondansetron (ZOFRAN-ODT) 4 MG disintegrating tablet Take 1 tablet (4 mg total) by mouth every 8 (eight) hours as needed for nausea or vomiting. (Patient taking differently: Take 4 mg by mouth every 8 (eight) hours as needed for nausea or vomiting (dissolve orally).) 30 tablet 1   osimertinib mesylate (TAGRISSO) 80 MG tablet Take 1 tablet (80 mg total) by mouth daily. 30 tablet 4   [START ON 09/13/2023] oxyCODONE (OXY IR/ROXICODONE) 5 MG immediate release tablet Take 1-2 tablets (5-10 mg total) by mouth every 4 (four) hours as  needed for severe pain (pain score 7-10) or breakthrough pain. 60 tablet 0   PEPCID AC 10 MG tablet Take 10 mg by mouth 2 (two) times daily.     predniSONE (DELTASONE) 10 MG tablet Take 1 tablet (10 mg total) by mouth daily with breakfast. 30 tablet 2   prochlorperazine (COMPAZINE) 10 MG tablet Take 1 tablet (10 mg total) by mouth every 6 (six) hours as needed. (Patient not taking: Reported on 09/07/2023) 30 tablet 2   prochlorperazine (COMPAZINE) 10 MG tablet Take 1 tablet (10 mg total) by mouth every 6 (six) hours as needed. (Patient taking differently: Take 10 mg by mouth every 6 (six) hours as needed for nausea or vomiting.) 30 tablet 2   REPATHA SURECLICK 140 MG/ML SOAJ Inject 140 mg into the skin every 14 (fourteen) days. 6 mL 3  Wheat Dextrin (BENEFIBER DRINK MIX) PACK Take 1 packet by mouth daily.     No current facility-administered medications for this visit.   Facility-Administered Medications Ordered in Other Visits  Medication Dose Route Frequency Provider Last Rate Last Admin   0.9 %  sodium chloride infusion   Intravenous Continuous Si Gaul, MD       sodium chloride flush (NS) 0.9 % injection 10 mL  10 mL Intracatheter PRN Si Gaul, MD   10 mL at 09/11/23 1555    VITAL SIGNS: LMP  (LMP Unknown)  There were no vitals filed for this visit.  Estimated body mass index is 22.76 kg/m as calculated from the following:   Height as of 09/07/23: 5\' 4"  (1.626 m).   Weight as of an earlier encounter on 09/11/23: 132 lb 9.6 oz (60.1 kg).   PERFORMANCE STATUS (ECOG) : 2 - Symptomatic, <50% confined to bed   Physical Exam General: Fatigue Cardiovascular: regular rate and rhythm Pulmonary: diminished, dyspnea with minimal exertion, cough Extremities: no edema, no joint deformities Skin: no rashes Neurological: AAO x3  IMPRESSION: Discussed the use of AI scribe software for clinical note transcription with the patient, who gave verbal consent to proceed.  History  of Present Illness Allison [name] is a 73 year old female who presents to clinic for symptom management follow-up. Patient now with disease reoccurrence. She is accompanied by her significant other and one daughter, with two other daughters on video chat.   Ms. Rahilly this chart from the hospital yesterday after being treated for bilateral upper extremity DVTs.  She is currently managing with Lovenox. Denies concerns with nausea, vomiting, or diarrhea. Ongoing fatigue, dyspnea with minimal exertion, coughs uncontrollably when talking. Also endorses pain. States her appetite fluctuates. Some days are better than others.   She has not had a bowel movement in seven days, which is a concern given her current medication regimen. She has been taking Colace twice a day without effect. She has been increasing her water intake to help with constipation. Education provided on the use of Senna-S 2 tablets at bedtime. If no improvement increase to 2 tablets twice daily. She also has Miralax on hand at home.   Rina is experiencing significant chest pain and heaviness, which is her main complaint. Oxycodone, taken every six hours, provides some relief, and she also found fentanyl to be effective during recent hospitalization. She is currently taking oxycodone 5 to 10 mg every four to six hours as needed, last taken at 6:30 AM. Gabapentin 200 mg at bedtime.  We discussed increasing to 200 mg twice daily.  If tolerated will consider increasing to 3 times daily if needed.  Education provided on pain management including each medication indication.  I discussed at length the use of long-acting pain medication in addition to her oxycodone for breakthrough.  Education provided on use of fentanyl patch including administration, efficacy, potential side effects, and placement of patches.  She and family verbalized understanding and patient wishes to pursue to allow for better pain management and comfort.  She understands we  will continue to closely monitor and adjust regimen as needed.  Patient is currently complaining of discomfort while in clinic.  Will administer morphine 2 mg IV and infusion for pain and dyspnea.  Ms. Ragan and family continue to remain hopeful for stability with treatment.  Her goal is to continue to treat the treatable allow her every opportunity to continue to thrive while aggressively managing her symptoms.  Her  quality of life is most important to her and her family.  I discussed the importance of continued conversation with family and their medical providers regarding overall plan of care and treatment options, ensuring decisions are within the context of the patients values and GOCs. Assessment & Plan Cancer related pain Persistent chest wall pain currently managed with oxycodone since discharge on yesterday.  Patient reports minimal relief.  Fentanyl patch considered for long-acting relief. Discussed application, precautions, and monitoring for adverse effects. - Continue oxycodone 5-10 mg every 4-6 hours as needed. - Prescribe fentanyl 12.5 mcg patch for long-acting pain relief, change every three days. - Educate on proper application and monitoring for adverse effects of fentanyl patch. -Will continue to closely monitor with possible increase to 3 times daily if needed.   -Continue Xanax as needed for anxiety. -Will continue to closely monitor and adjust regimen as needed -Administer 2 mg IV morphine during infusion for pain control and dyspnea.  Last dose of oxycodone taken at 630 this morning.  Constipation No bowel movement in seven days, likely due to decreased activity and opioid use. Current use of Colace ineffective. Senokot-S recommended for opioid-induced constipation. - Discontinue Colace. - Start Senokot-S, two tablets at bedtime daily.  Increase to twice daily if no improvement. - Increase water intake. - Monitor for regular bowel movements and adjust Senokot-S dosage  if stools become loose. -Laxatives to facilitate bowel movement.  Follow-up Coordination of care with pharmacy for medication availability and insurance authorization for fentanyl patch.  - Send prescriptions for oxycodone, fentanyl patch, and Senokot-S to [facility]. - Check insurance authorization for fentanyl patch. - Schedule follow-up appointment on October 02, 2023.  Will plan to follow-up by phone on Friday for close monitoring.  Patient expressed understanding and was in agreement with this plan. She also understands that She can call the clinic at any time with any questions, concerns, or complaints.   Any controlled substances utilized were prescribed in the context of palliative care. PDMP has been reviewed.   Visit consisted of counseling and education dealing with the complex and emotionally intense issues of symptom management and palliative care in the setting of serious and potentially life-threatening illness.  Willette Alma, AGPCNP-BC  Palliative Medicine Team/[facility]

## 2023-09-11 NOTE — Patient Instructions (Signed)
 [facility] - A DEPT OF [facility]  Discharge Instructions: Thank you for choosing [facility] to provide your oncology and hematology care.   If you have a lab appointment with the Cancer Center, please go directly to the Cancer Center and check in at the registration area.   Wear comfortable clothing and clothing appropriate for easy access to any Portacath or PICC line.   We strive to give you quality time with your provider. You may need to reschedule your appointment if you arrive late (15 or more minutes).  Arriving late affects you and other patients whose appointments are after yours.  Also, if you miss three or more appointments without notifying the office, you may be dismissed from the clinic at the provider's discretion.      For prescription refill requests, have your pharmacy contact our office and allow 72 hours for refills to be completed.    Today you received the following chemotherapy and/or immunotherapy agents Alimta      To help prevent nausea and vomiting after your treatment, we encourage you to take your nausea medication as directed.  BELOW ARE SYMPTOMS THAT SHOULD BE REPORTED IMMEDIATELY: *FEVER GREATER THAN 100.4 F (38 C) OR HIGHER *CHILLS OR SWEATING *NAUSEA AND VOMITING THAT IS NOT CONTROLLED WITH YOUR NAUSEA MEDICATION *UNUSUAL SHORTNESS OF BREATH *UNUSUAL BRUISING OR BLEEDING *URINARY PROBLEMS (pain or burning when urinating, or frequent urination) *BOWEL PROBLEMS (unusual diarrhea, constipation, pain near the anus) TENDERNESS IN MOUTH AND THROAT WITH OR WITHOUT PRESENCE OF ULCERS (sore throat, sores in mouth, or a toothache) UNUSUAL RASH, SWELLING OR PAIN  UNUSUAL VAGINAL DISCHARGE OR ITCHING   Items with * indicate a potential emergency and should be followed up as soon as possible or go to the Emergency Department if any problems should occur.  Please show the CHEMOTHERAPY ALERT CARD or IMMUNOTHERAPY  ALERT CARD at check-in to the Emergency Department and triage nurse.  Should you have questions after your visit or need to cancel or reschedule your appointment, please contact [facility] - A DEPT OF [facility]  Dept: [phone]  and follow the prompts.  Office hours are 8:00 a.m. to 4:30 p.m. Monday - Friday. Please note that voicemails left after 4:00 p.m. may not be returned until the following business day.  We are closed weekends and major holidays. You have access to a nurse at all times for urgent questions. Please call the main number to the clinic Dept: [phone] and follow the prompts.   For any non-urgent questions, you may also contact your provider using MyChart. We now offer e-Visits for anyone 79 and older to request care online for non-urgent symptoms. For details visit [email or web address].   Also download the MyChart app! Go to the app store, search "MyChart", open the app, select [place], and log in with your MyChart username and password.

## 2023-09-12 ENCOUNTER — Telehealth: Payer: Self-pay

## 2023-09-12 DIAGNOSIS — Z7189 Other specified counseling: Secondary | ICD-10-CM

## 2023-09-12 DIAGNOSIS — Z515 Encounter for palliative care: Secondary | ICD-10-CM

## 2023-09-12 DIAGNOSIS — G893 Neoplasm related pain (acute) (chronic): Secondary | ICD-10-CM

## 2023-09-12 NOTE — Telephone Encounter (Signed)
 Pt called and LVM regarding her inability  pick up her oxycodone. RN called pt and family back informing them that oxycodone is too soon to be picked up and will be available tomorrow, 3/27. Pt and family verbalize understanding. Pt also placed RN on speaker phone and Lenn Sink [phone]) nurse from [facility] home care was in the home. Pt, RN and amedysis nurse spoke and reporte that they could not provide services for patient at this time. Pt requested [facility] that provided services for her in 2022, RN explained that [facility] will be looked into. Pt and family verbalize understanding and agreement.   RN placed palliative referral for [facility] and spoke with Haynes Bast, RN who will work on providing in-home skilled nursing and aide services for pt. Will call when updates are available.

## 2023-09-13 ENCOUNTER — Other Ambulatory Visit (HOSPITAL_COMMUNITY): Payer: Self-pay

## 2023-09-13 ENCOUNTER — Encounter: Payer: Self-pay | Admitting: Internal Medicine

## 2023-09-13 ENCOUNTER — Encounter: Payer: Self-pay | Admitting: Physician Assistant

## 2023-09-19 ENCOUNTER — Encounter: Payer: Self-pay | Admitting: Acute Care

## 2023-09-19 ENCOUNTER — Telehealth: Payer: Self-pay | Admitting: Acute Care

## 2023-09-19 ENCOUNTER — Other Ambulatory Visit: Payer: Self-pay

## 2023-09-19 ENCOUNTER — Other Ambulatory Visit (HOSPITAL_COMMUNITY): Payer: Self-pay

## 2023-09-19 ENCOUNTER — Ambulatory Visit: Admitting: Acute Care

## 2023-09-19 ENCOUNTER — Ambulatory Visit (INDEPENDENT_AMBULATORY_CARE_PROVIDER_SITE_OTHER)

## 2023-09-19 ENCOUNTER — Encounter (INDEPENDENT_AMBULATORY_CARE_PROVIDER_SITE_OTHER): Payer: Medicare HMO | Admitting: Ophthalmology

## 2023-09-19 VITALS — BP 182/88 | HR 62 | Ht 64.0 in | Wt 127.0 lb

## 2023-09-19 DIAGNOSIS — C3491 Malignant neoplasm of unspecified part of right bronchus or lung: Secondary | ICD-10-CM | POA: Diagnosis not present

## 2023-09-19 DIAGNOSIS — R9389 Abnormal findings on diagnostic imaging of other specified body structures: Secondary | ICD-10-CM

## 2023-09-19 DIAGNOSIS — J209 Acute bronchitis, unspecified: Secondary | ICD-10-CM | POA: Diagnosis not present

## 2023-09-19 DIAGNOSIS — J440 Chronic obstructive pulmonary disease with acute lower respiratory infection: Secondary | ICD-10-CM | POA: Diagnosis not present

## 2023-09-19 DIAGNOSIS — R051 Acute cough: Secondary | ICD-10-CM

## 2023-09-19 DIAGNOSIS — R0600 Dyspnea, unspecified: Secondary | ICD-10-CM

## 2023-09-19 MED ORDER — DOXYCYCLINE HYCLATE 100 MG PO TABS
100.0000 mg | ORAL_TABLET | Freq: Two times a day (BID) | ORAL | 0 refills | Status: DC
  Filled 2023-09-19: qty 14, 7d supply, fill #0

## 2023-09-19 NOTE — Patient Instructions (Addendum)
 It is good to see you today. We will send in a refill for your hycodan. Continue your Oxycodone as you have been doing. Always separate your Hycodan cough syrup from your oxycodone by at least 2 hours. Do not take either if you are very sleepy. CXR today, stat We will call you with results. Continue pepcid as prescribed. I have renewed your Hycodan. Take Q 4 hours as needed for cough as you have been doing. Try the Bevespi inhaler with the spacer to see if that helps with the coughing. Sips of water instead of throat clearing Sugar Free Coca-Cola or Werther's originals for throat soothing. Delsym Cough syrup 5 cc's every 12 hours Non-sedating antihistamine of your choice daily ( Zyrtec, Allegra, Xyzol, Claritin ( Generic ok)  Add Flonase 1 spray in each nare once daily. Follow up  in 2 months or as needed. Please contact office for sooner follow up if symptoms do not improve or worsen or seek emergency care

## 2023-09-19 NOTE — Telephone Encounter (Signed)
 I have called the patient with the results of the CXR we did oin the office today. There is a small right sided infiltrate.She is afebrile, and has clear to light yellow secretions, but out of utmost precautions, I am sending in a prescription for Doxycycline 100 mg BID x 7 days. Pt. Verbalized understanding and had no further questions at completion of the call. I asked her to call the office to be seen for any fever, or worsening of her status. She verbalized understanding.

## 2023-09-19 NOTE — Progress Notes (Signed)
 History of Present Illness Allison [name] is a 73 y.o. female former smoker with stage 4 adenocarcinoma of the right lung, (12/2017), treated with  chemo, and radiation therapy, hypertension, CVA, CAD, DVT/PE (on Lovenox ),iron  deficiency anemia,  and GERD. She has been  followed by Dr. Thelda Finney. She will be followed by  Dr. Baldwin Levee moving forward.   PRIOR THERAPY: 1) Status post right Pleurx catheter placement by Dr. Matt Song for drainage of malignant right pleural effusion. 2) palliative radiotherapy to the enlarging right upper lobe lung mass and mediastinum under the care of Dr. Eloise Hake expected to be completed on January 05, 2021. 3) Systemic chemotherapy with carboplatin  for AUC of 5 and Alimta  500 Mg/M2 every 3 weeks.  First dose 03/17/2021.  The patient will also continue her current treatment with Tagrisso  80 mg p.o. daily.  She is status post 26 cycles. Starting from cycle #7, she will be on Alimta  only 400 mg/m2.  Her treatment with Alimta  has been on hold for several months now.    CURRENT THERAPY: Tagrisso  80 mg p.o. daily.  First dose was given on 01/29/2018.  Status post 68.5 months of treatment.  IV chemotherapy with alimta  400 mg/m2 IV every 3 weeks to start around 09/11/23. Status post 2 cycles.   Synopsis Pt. Presented for a slow to resolve  bronchitis  on 08/06/2023 that started 07/09/2023. She was at the [facility] parade, and she thinks the cold air started the flare. She was seen by her PCP on 07/16/2023. She tested negative for Flu A, Covid, and RSV. She was treated with a  prednisone  taper, and  Doxycycline . She stated she did not feel any better after treatment. She did have a CT Chest 07/25/2023 per oncology as  follow up for lung cancer surveillance , which showed inflammatory changes and ? Of lymphangitic carcinomatosis. She saw Dr. Hershal Loron, PCP,  again, and she had a repeat prednisone  taper. She has seen Dr. Marguerita Shih since, and he placed her on Levaquin  08/02/2023 which she takes every other day  x 4 tablets. She stated she felt no better on the Levaquin , but she  had only 2 doses. She stated her appetite was poor , and her secretions are clear, but thicker. She was doing Muicinex with her Hycodan cough syrup. She was using this every 4 hours. She endorsed  shortness of breath. She was using her nebulizer twice daily. She had  not been using her flutter valve or chest vest. She denied fever, but she had a very dry cough. She stated her chest is tight at the time , but it does not hurt. Her BP was  elevated ,her sats are 97% . She states she did have wheezing on expiration when she is laying down only. She had completed  her pred taper 08/04/2023 Dr. Marguerita Shih was  unsure if the changes seen on CT were inflammatory vs recurrence of cancer. I had Dr. Baldwin Levee look at the scan , he felt it was recurrent lung cancer. .  CXR 08/06/2023  also questioned lymphangitic spread of tumor.   FENO was 18 ppb on 08/06/2023. Eosinophils on 07/25/2023 were 100, last prednisone  taper was completed 2/15.  She was seen in the office 08/2023 and started on prednisone  10 mg daily for her continued cough. She was given the option of trying a  PPI, which she has not done, but she did start pepcid .   10/26/2023 Pt. Presents today for continued cough with dyspnea. She states  cough got  better on Friday 3/28, and was good until 09/17/2023, then started up again. She questions if this is allergies. She stays inside but she has family members coming in and out to care for her.  She states the hycodan is working better for cough than the prednisone , and the oxy also helps.Secretions are clear with some occasional discolored secretions. I will do a CXR today to ensure she has not developed a pneumonia. She denies fever.She may be too weak to cough up thick secretions.  Started Chemo  infusions on 09/11/2023 for disease progression.  She was started prednisone  on 3/24,3/25, 3/26 for cough as well as appetite. She has subsequently started 10  mg every day.  She is not using Bevespi  or nebs because they make her cough. I will add a spacer to see if this helps. I do believe it should help with her cough. She is weaker today, and in a wheelchair.   Test Results: CT Chest 09/07/2023 Increased peribronchovascular consolidation throughout the right lung with increased nodular interlobular septal thickening throughout the right lung, suspicious for progression of disease. 2. New 1.4 cm left lower lobe nodule contains internal hypodensities, indeterminate which may represent metastatic disease or a focus of lipoid pneumonia given reported aspiration. 3. New 8 mm lateral left apical nodule and scattered ground-glass nodules, predominantly within the left upper lobe, which may be infectious/inflammatory or metastatic. 4. Increased small right and moderate left pleural effusions. 5. Age indeterminate superior endplate compression deformity of T7, new compared to 07/25/2023.    Latest Ref Rng & Units 10/23/2023    2:36 PM 10/02/2023    7:46 AM 09/11/2023   12:46 PM  CBC  WBC 4.0 - 10.5 K/uL 9.8  5.5  9.5   Hemoglobin 12.0 - 15.0 g/dL 13.0  86.5  78.4   Hematocrit 36.0 - 46.0 % 33.6  33.6  37.0   Platelets 150 - 400 K/uL 256  291  160        Latest Ref Rng & Units 10/23/2023    2:36 PM 10/02/2023    7:46 AM 09/11/2023   12:46 PM  BMP  Glucose 70 - 99 mg/dL 696  295  284   BUN 8 - 23 mg/dL 19  21  16    Creatinine 0.44 - 1.00 mg/dL 1.32  4.40  1.02   Sodium 135 - 145 mmol/L 138  139  138   Potassium 3.5 - 5.1 mmol/L 3.6  4.0  4.0   Chloride 98 - 111 mmol/L 107  106  106   CO2 22 - 32 mmol/L 26  27  26    Calcium  8.9 - 10.3 mg/dL 9.0  9.2  9.5     BNP    Component Value Date/Time   BNP 35.4 02/07/2021 1640    ProBNP No results found for: "PROBNP"  PFT No results found for: "FEV1PRE", "FEV1POST", "FVCPRE", "FVCPOST", "TLC", "DLCOUNC", "PREFEV1FVCRT", "PSTFEV1FVCRT"  No results found.    Past medical hx Past Medical  History:  Diagnosis Date   Anemia    Anxiety    Arthritis    Asthma    exercise induced   Coronary artery disease    Depression    PMH   Dyspnea    GERD (gastroesophageal reflux disease)    Glaucoma    History of radiation therapy 01/05/2021   IMRT right lung  11/24/2020-01/05/2021  Dr Retta Caster   Hypertension    lung ca dx'd 11/2017   right   Malignant pleural  effusion    right   Nuclear sclerotic cataract of right eye 02/28/2021   Dr. Devin Foerster, cataract surgery February 2023   PONV (postoperative nausea and vomiting)    Pre-diabetes    Raynaud's disease    Raynaud's disease    Stroke (HCC) 01/2021   balance off, some express aphasia, weakness     Social History   Tobacco Use   Smoking status: Never    Passive exposure: Past   Smokeless tobacco: Never  Vaping Use   Vaping status: Never Used  Substance Use Topics   Alcohol use: Not Currently    Comment: up to 3 drinks per week   Drug use: No    Comment: CBD oil     Ms.Pawlowicz reports that she has never smoked. She has been exposed to tobacco smoke. She has never used smokeless tobacco. She reports that she does not currently use alcohol. She reports that she does not use drugs.  Tobacco Cessation: Counseling given: Not Answered Never smoker   Past surgical hx, Family hx, Social hx all reviewed.  Current Outpatient Medications on File Prior to Visit  Medication Sig   ALPRAZolam  (XANAX ) 0.25 MG tablet Take 1 tablet (0.25 mg total) by mouth 2 (two) times daily as needed for anxiety. May take 2 tablets (0.5 mg total) at bedtime. (Patient taking differently: Take 0.25-0.5 mg by mouth 2 (two) times daily as needed for anxiety or sleep.)   dorzolamide -timolol  (COSOPT ) 22.3-6.8 MG/ML ophthalmic solution Place 1 drop into both eyes 2 (two) times daily.   enoxaparin  (LOVENOX ) 60 MG/0.6ML injection Inject 0.55 mLs (55 mg total) into the skin every 12 (twelve) hours.   ezetimibe  (ZETIA ) 10 MG tablet Take 10 mg by  mouth daily.   FeFum-FePoly-FA-B Cmp-C-Biot (FOLIVANE-PLUS) CAPS Take 1 capsule by mouth in the morning.   FLUoxetine  (PROZAC ) 20 MG capsule Take 1 capsule (20 mg total) by mouth daily.   folic acid  (FOLVITE ) 1 MG tablet TAKE 1 TABLET BY MOUTH EVERY DAY   gabapentin  (NEURONTIN ) 250 MG/5ML solution Take 4 mLs (200 mg total) by mouth at bedtime.   hyoscyamine  (LEVSIN SL) 0.125 MG SL tablet Place 1-2 tablets (0.125-0.25mg ) under the tongue every 6 (six) hours as needed for cramping.   latanoprost  (XALATAN ) 0.005 % ophthalmic solution Place 1 drop into both eyes at bedtime.    ondansetron  (ZOFRAN -ODT) 4 MG disintegrating tablet Take 1 tablet (4 mg total) by mouth every 8 (eight) hours as needed for nausea or vomiting. (Patient taking differently: Take 4 mg by mouth every 8 (eight) hours as needed for nausea or vomiting (dissolve orally).)   osimertinib  mesylate (TAGRISSO ) 80 MG tablet Take 1 tablet (80 mg total) by mouth daily.   PEPCID  AC 10 MG tablet Take 10 mg by mouth 2 (two) times daily.   predniSONE  (DELTASONE ) 10 MG tablet Take 1 tablet (10 mg total) by mouth daily with breakfast.   prochlorperazine  (COMPAZINE ) 10 MG tablet Take 1 tablet (10 mg total) by mouth every 6 (six) hours as needed. (Patient taking differently: Take 10 mg by mouth every 6 (six) hours as needed for nausea or vomiting.)   REPATHA  SURECLICK 140 MG/ML SOAJ Inject 140 mg into the skin every 14 (fourteen) days.   senna-docusate (SENNA S) 8.6-50 MG tablet Take 2 tablets by mouth at bedtime.   amLODipine  (NORVASC ) 5 MG tablet Take 1 tablet (5 mg total) by mouth daily. (Patient not taking: Reported on 09/19/2023)   dexamethasone  (DECADRON ) 4 MG tablet Take  1 tablet twice a day the day before, day of, and day after treatment (Patient not taking: Reported on 09/07/2023)   Glycopyrrolate -Formoterol  (BEVESPI  AEROSPHERE) 9-4.8 MCG/ACT AERO Inhale 2 puffs into the lungs 2 (two) times daily. (Patient not taking: Reported on 09/19/2023)    Wheat Dextrin (BENEFIBER DRINK MIX) PACK Take 1 packet by mouth daily.   No current facility-administered medications on file prior to visit.     Allergies  Allergen Reactions   Hydrocodone -Acetaminophen  Other (See Comments)    Irregular heart rate Can tolerate Hycodan in 2025.   Other Other (See Comments)    No cold water = triggers coughing!!  Unnamed glaucoma eye drop - turned eyes dark   Penicillins Other (See Comments)    SYNCOPE PATIENT HAS HAD A PCN REACTION WITH IMMEDIATE RASH, FACIAL/TONGUE/THROAT SWELLING, SOB, OR LIGHTHEADEDNESS WITH HYPOTENSION: Yes Has patient had a PCN reaction causing severe rash involving mucus membranes or skin necrosis: No Has patient had a PCN reaction that required hospitalization: No Has patient had a PCN reaction occurring within the last 10 years: No   Lactose Other (See Comments)    Allergy to all dairy products Abdominal cramping   Vicodin [Hydrocodone -Acetaminophen ] Other (See Comments)    Sped up heart and breathing (tolerates Hycodan in 2025)   Bacid Nausea And Vomiting   Ipratropium-Albuterol  Cough    Review Of Systems:  Constitutional:   + weight loss, No night sweats,  Fevers, chills, +fatigue, or  lassitude.  HEENT:   No headaches,  Difficulty swallowing,  Tooth/dental problems, or  Sore throat,                No sneezing, itching, ear ache, nasal congestion, post nasal drip,   CV:  No chest pain,  Orthopnea, PND, swelling in lower extremities, anasarca, dizziness, palpitations, syncope.   GI  No heartburn, indigestion, abdominal pain, nausea, vomiting, diarrhea, change in bowel habits, +loss of appetite, No bloody stools.   Resp: + shortness of breath with exertion or at rest.  + excess mucus, + productive cough,  + non-productive cough,  No coughing up of blood.  No change in color of mucus.  + wheezing.  No chest wall deformity  Skin: no rash or lesions.  GU: no dysuria, change in color of urine, no urgency or frequency.   No flank pain, no hematuria   MS:  No joint pain or swelling.  + decreased range of motion.  No back pain.  Psych:  No change in mood or affect. No depression or anxiety.  No memory loss.   Vital Signs BP (!) 182/88 (BP Location: Left Arm, Patient Position: Sitting, Cuff Size: Normal)   Pulse 62   Ht 5\' 4"  (1.626 m)   Wt 127 lb (57.6 kg)   LMP  (LMP Unknown)   SpO2 98%   BMI 21.80 kg/m    Physical Exam:  General- No distress,  A&Ox3, pleasant frail female in wheelchair ENT: No sinus tenderness, TM clear, pale nasal mucosa, no oral exudate,no post nasal drip, no LAN Cardiac: S1, S2, regular rate and rhythm, no murmur Chest: No wheeze/ rales/ dullness; no accessory muscle use, no nasal flaring, no sternal retractions, diminished per bases.  Abd.: Soft Non-tender, ND, BS +. Body mass index is 21.8 kg/m.  Ext: No clubbing cyanosis, edema, no obvious deformities Neuro:  normal strength, MAE x 4, A&O x 3 Skin: No rashes, warm and dry, No obvious lesions  Psych: normal mood and behavior   Assessment/Plan  Slow to resolve bronchitis , cough Treated with Antibiotics x 2, Pred Taper x 2 No significant improvement  Plan Continue Mucinex  1200 mg ( liquid of tablets) Use Flutter valve or chest vest to help mobilize the mucus so you can cough it up.  We will send in a refill for your hycodan. Continue your Oxycodone  as you have been doing. Always separate your Hycodan cough syrup from your oxycodone  by at least 2 hours. Do not take either if you are very sleepy. CXR today, stat Try the Bevespi  inhaler with the spacer to see if that helps with the coughing. Sips of water instead of throat clearing Sugar Free Coca-Cola or Werther's originals for throat soothing. Delsym  Cough syrup 5 cc's every 12 hours Non-sedating antihistamine of your choice daily ( Zyrtec, Allegra, Xyzol, Claritin  ( Generic ok)  Add Flonase  1 spray in each nare once daily. Follow up  in 2 months or as  needed. Please contact office for sooner follow up if symptoms do not improve or worsen or seek emergency care   Voice rest. Warm drinks like hot tea and honey. Call if you need us  sooner     Stage IV Adenocarcinoma of the lung Disease progression.  PET scan concerning for Lymphogenic spread of cancer Plan Started infusions per Dr. Marguerita Shih 3/25   Fatigue Plan Follow up with oncology for labwork   Poor Appetite Plan Add Boost as a supplement Consider Megace to stimulate appetite if no improvement    I spent 50 minutes dedicated to the care of this patient on the date of this encounter to include pre-visit review of records, face-to-face time with the patient discussing conditions above, post visit ordering of testing, clinical documentation with the electronic health record, making appropriate referrals as documented, and communicating necessary information to the patient's healthcare team.    Addendum 09/19/2023 I have called the patient with the results of the CXR we did oin the office today. There is a small right sided infiltrate.She is afebrile, and has clear to light yellow secretions, but out of utmost precautions, I am sending in a prescription for Doxycycline  100 mg BID x 7 days. Pt. Verbalized understanding and had no further questions at completion of the call. I asked her to call the office to be seen for any fever, or worsening of her status. She verbalized understanding.    This documentation is reflective of my work 09/19/2023  Raejean Bullock, NP 10/26/2023  4:14 PM

## 2023-09-24 ENCOUNTER — Other Ambulatory Visit (HOSPITAL_COMMUNITY): Payer: Self-pay

## 2023-09-24 ENCOUNTER — Other Ambulatory Visit: Payer: Self-pay | Admitting: Acute Care

## 2023-09-24 DIAGNOSIS — R051 Acute cough: Secondary | ICD-10-CM

## 2023-09-24 DIAGNOSIS — R0600 Dyspnea, unspecified: Secondary | ICD-10-CM

## 2023-09-24 NOTE — Telephone Encounter (Signed)
**Note De-identified  [place] Obfuscation** Please advise 

## 2023-09-25 ENCOUNTER — Other Ambulatory Visit: Payer: Self-pay

## 2023-09-25 ENCOUNTER — Other Ambulatory Visit (HOSPITAL_COMMUNITY): Payer: Self-pay

## 2023-09-25 ENCOUNTER — Telehealth: Payer: Self-pay

## 2023-09-25 ENCOUNTER — Encounter: Payer: Self-pay | Admitting: General Practice

## 2023-09-25 ENCOUNTER — Other Ambulatory Visit: Payer: Self-pay | Admitting: Acute Care

## 2023-09-25 DIAGNOSIS — R053 Chronic cough: Secondary | ICD-10-CM

## 2023-09-25 MED ORDER — HYDROCODONE BIT-HOMATROP MBR 5-1.5 MG/5ML PO SOLN
5.0000 mL | Freq: Four times a day (QID) | ORAL | 0 refills | Status: DC | PRN
  Filled 2023-09-25: qty 240, 12d supply, fill #0

## 2023-09-25 NOTE — Progress Notes (Signed)
 [facility] Spiritual Care Note  Makayah and I had planned to connect in infusion next week, but I will miss her due to an unexpected scheduling conflict. Phoned to let her know and to share good wishes. We plan to follow up again at a future treatment.   [address] [place], [place], [facility] Pager [phone] Voicemail [phone]

## 2023-09-25 NOTE — Telephone Encounter (Signed)
 Copied from CRM [id number]. Topic: Clinical - Prescription Issue >> Sep 25, 2023  3:07 PM Shelbie Proctor wrote: Reason for CRM: Patient [phone] states the pharmacy does not have HYDROcodone bit-homatropine (HYCODAN) 5-1.5 MG/5ML syrup since last week office visit 09/19/23. Patient is out of medication and needs it to be sent to [facility] [address] [place] [address] Phone: [phone] Fax: [phone].  Also, patient has filled out the form Bevespi inhaler for patient assistance, what does patient need to do, bring the forms to the office? Please advise and call back.  [name], Please advise.

## 2023-09-26 ENCOUNTER — Other Ambulatory Visit (HOSPITAL_COMMUNITY): Payer: Self-pay

## 2023-09-26 ENCOUNTER — Encounter: Payer: Self-pay | Admitting: Internal Medicine

## 2023-09-26 ENCOUNTER — Ambulatory Visit: Attending: Cardiology | Admitting: Cardiology

## 2023-09-26 ENCOUNTER — Encounter: Payer: Self-pay | Admitting: Physician Assistant

## 2023-09-26 VITALS — BP 175/85 | HR 84 | Ht 64.0 in | Wt 123.8 lb

## 2023-09-26 DIAGNOSIS — I2510 Atherosclerotic heart disease of native coronary artery without angina pectoris: Secondary | ICD-10-CM

## 2023-09-26 DIAGNOSIS — E785 Hyperlipidemia, unspecified: Secondary | ICD-10-CM

## 2023-09-26 DIAGNOSIS — I639 Cerebral infarction, unspecified: Secondary | ICD-10-CM

## 2023-09-26 DIAGNOSIS — R0602 Shortness of breath: Secondary | ICD-10-CM

## 2023-09-26 DIAGNOSIS — I10 Essential (primary) hypertension: Secondary | ICD-10-CM | POA: Diagnosis not present

## 2023-09-26 MED ORDER — CARVEDILOL 6.25 MG PO TABS
6.2500 mg | ORAL_TABLET | Freq: Two times a day (BID) | ORAL | 3 refills | Status: DC
  Filled 2023-09-26: qty 180, 90d supply, fill #0
  Filled 2023-12-31: qty 180, 90d supply, fill #1

## 2023-09-26 MED ORDER — ISOSORBIDE MONONITRATE ER 30 MG PO TB24
30.0000 mg | ORAL_TABLET | Freq: Every day | ORAL | 3 refills | Status: AC
  Filled 2023-09-26: qty 90, 90d supply, fill #0
  Filled 2024-04-06: qty 90, 90d supply, fill #1
  Filled 2024-07-10: qty 90, 90d supply, fill #2

## 2023-09-26 NOTE — Progress Notes (Signed)
 Cardiology Office Note:    Date:  09/26/2023   ID:  Allison [name], DOB 29-Oct-1950, MRN [id number]  PCP:  [name], MD  Cardiologist:  Little Ishikawa, MD  Electrophysiologist:  None   Referring MD: [name], MD   Chief Complaint  Patient presents with   Coronary Artery Disease    History of Present Illness:    Allison [name] is a 73 y.o. female with a hx of CAD status post DES to RCA 06/16/19, stage IV lung cancer, hypertension, hyperlipidemia, DVT/PE, CVA who presents for follow-up.  She presented to ED on 06/12/2019 with chest pain, found to have NSTEMI with peak high-sensitivity troponin 9133.  TTE on 06/13/2019 showed LVEF 55 to 60%, mild LVH, low normal RV systolic function, moderate MR. Cath showed 95% proximal RCA lesion, drug-eluting stent was successfully placed.  Zio patch x13 days on 01/07/2020 showed 3 episodes of NSVT longest lasting 7 beats, 17 episodes of SVT, longest lasting 15 seconds.  At clinic visit in September 2021, she reported chest pains.  Lexiscan Myoview on 03/19/2020 showed normal perfusion, EF 70%.  She was admitted to [facility] 02/08/2021 with acute DVT/PE.  Echocardiogram 02/08/2021 showed normal biventricular function, no significant valvular disease.  She was discharged on Eliquis 02/10/2021.  Subsequently was admitted back to [facility] on 02/11/2021 with acute CVA.  This was thought to be embolic in setting of hypercoagulable state due to stage IV lung cancer.  Oncology recommended switching from Eliquis to Lovenox for anticoagulation.  Echocardiogram 02/13/2021 showed normal LV systolic function, mild to moderate MR. Zio patch x14 days on 07/21/2021 showed no significant arrhythmias.  Since last clinic visit, she reports she has been having shortness of breath with minimal exertion.  Also feels chest pain when she is exerting herself or taking a deep breath.  She is back on chemotherapy.   Past Medical History:  Diagnosis Date   Anemia    Anxiety     Arthritis    Asthma    exercise induced   Coronary artery disease    Depression    PMH   Dyspnea    GERD (gastroesophageal reflux disease)    Glaucoma    History of radiation therapy 01/05/2021   IMRT right lung  11/24/2020-01/05/2021  Dr Antony Blackbird   Hypertension    lung ca dx'd 11/2017   right   Malignant pleural effusion    right   Nuclear sclerotic cataract of right eye 02/28/2021   Dr. Sallye Lat, cataract surgery February 2023   PONV (postoperative nausea and vomiting)    Pre-diabetes    Raynaud's disease    Raynaud's disease    Stroke (HCC) 01/2021   balance off, some express aphasia, weakness    Past Surgical History:  Procedure Laterality Date   ABDOMINAL HYSTERECTOMY     partial   BRONCHIAL BIOPSY  04/21/2021   Procedure: BRONCHIAL BIOPSIES;  Surgeon: Josephine Igo, DO;  Location: [facility] ENDOSCOPY;  Service: Pulmonary;;   BRONCHIAL BRUSHINGS  04/21/2021   Procedure: BRONCHIAL BRUSHINGS;  Surgeon: Josephine Igo, DO;  Location: [facility]OS[facility];  Service: Pulmonary;;   BRONCHIAL NEEDLE ASPIRATION BIOPSY  04/21/2021   Procedure: BRONCHIAL NEEDLE ASPIRATION BIOPSIES;  Surgeon: Josephine Igo, DO;  Location: [facility]OS[facility];  Service: Pulmonary;;   CHEST TUBE INSERTION Right 01/01/2018   Procedure: INSERTION PLEURAL DRAINAGE CATHETER;  Surgeon: Kerin Perna, MD;  Location: [facility] OR;  Service: Thoracic;  Laterality: Right;   CHEST TUBE INSERTION  04/21/2021   Procedure: CHEST TUBE INSERTION;  Surgeon: Josephine Igo, DO;  Location: [facility] ENDOS[facility];  Service: Pulmonary;;   COLONOSCOPY     CORONARY STENT INTERVENTION N/A 06/16/2019   Procedure: CORONARY STENT INTERVENTION;  Surgeon: Corky Crafts, MD;  Location: [facility] INVASIVE CV LAB;  Service: Cardiovascular;  Laterality: N/A;   DILATION AND CURETTAGE OF UTERUS     EYE SURGERY     due to Glaucoma   IR IMAGING GUIDED PORT INSERTION  03/22/2021   IR PORT REPAIR CENTRAL VENOUS ACCESS DEVICE  04/15/2021   LEFT HEART  CATH AND CORONARY ANGIOGRAPHY N/A 06/16/2019   Procedure: LEFT HEART CATH AND CORONARY ANGIOGRAPHY;  Surgeon: Corky Crafts, MD;  Location: [facility] INVASIVE CV LAB;  Service: Cardiovascular;  Laterality: N/A;   REMOVAL OF PLEURAL DRAINAGE CATHETER Right 11/07/2018   Procedure: REMOVAL OF PLEURAL DRAINAGE CATHETER;  Surgeon: Kerin Perna, MD;  Location: [facility] OR;  Service: Thoracic;  Laterality: Right;   ROTATOR CUFF REPAIR     TUBAL LIGATION     VIDEO BRONCHOSCOPY WITH ENDOBRONCHIAL NAVIGATION Bilateral 04/21/2021   Procedure: VIDEO BRONCHOSCOPY WITH ENDOBRONCHIAL NAVIGATION;  Surgeon: Josephine Igo, DO;  Location: [facility];  Service: Pulmonary;  Laterality: Bilateral;  ION   WISDOM TOOTH EXTRACTION      Current Medications: Current Meds  Medication Sig   ALPRAZolam (XANAX) 0.25 MG tablet Take 1 tablet (0.25 mg total) by mouth 2 (two) times daily as needed for anxiety. May take 2 tablets (0.5 mg total) at bedtime. (Patient taking differently: Take 0.25-0.5 mg by mouth 2 (two) times daily as needed for anxiety or sleep.)   carvedilol (COREG) 6.25 MG tablet Take 1 tablet (6.25 mg total) by mouth 2 (two) times daily.   dorzolamide-timolol (COSOPT) 22.3-6.8 MG/ML ophthalmic solution Place 1 drop into both eyes 2 (two) times daily.   doxycycline (VIBRA-TABS) 100 MG tablet Take 1 tablet (100 mg total) by mouth 2 (two) times daily.   enoxaparin (LOVENOX) 60 MG/0.6ML injection Inject 0.55 mLs (55 mg total) into the skin every 12 (twelve) hours.   ezetimibe (ZETIA) 10 MG tablet Take 10 mg by mouth daily.   FeFum-FePoly-FA-B Cmp-C-Biot (FOLIVANE-PLUS) CAPS Take 1 capsule by mouth in the morning.   fentaNYL (DURAGESIC) 12 MCG/HR Place 1 patch onto the skin every 3 (three) days.   FLUoxetine (PROZAC) 20 MG capsule Take 1 capsule (20 mg total) by mouth daily.   folic acid (FOLVITE) 1 MG tablet TAKE 1 TABLET BY MOUTH EVERY DAY   gabapentin (NEURONTIN) 250 MG/5ML solution Take 4 mLs (200 mg total)  by mouth at bedtime.   hyoscyamine (LEVSIN SL) 0.125 MG SL tablet Place 1-2 tablets (0.125-0.25mg ) under the tongue every 6 (six) hours as needed for cramping.   isosorbide mononitrate (IMDUR) 30 MG 24 hr tablet Take 1 tablet (30 mg total) by mouth daily.   latanoprost (XALATAN) 0.005 % ophthalmic solution Place 1 drop into both eyes at bedtime.    ondansetron (ZOFRAN-ODT) 4 MG disintegrating tablet Take 1 tablet (4 mg total) by mouth every 8 (eight) hours as needed for nausea or vomiting. (Patient taking differently: Take 4 mg by mouth every 8 (eight) hours as needed for nausea or vomiting (dissolve orally).)   osimertinib mesylate (TAGRISSO) 80 MG tablet Take 1 tablet (80 mg total) by mouth daily.   oxyCODONE (OXY IR/ROXICODONE) 5 MG immediate release tablet Take 1-2 tablets (5-10 mg total) by mouth every 4 (four) hours as needed for severe pain (  pain score 7-10) or breakthrough pain.   PEPCID AC 10 MG tablet Take 10 mg by mouth 2 (two) times daily.   predniSONE (DELTASONE) 10 MG tablet Take 1 tablet (10 mg total) by mouth daily with breakfast.   prochlorperazine (COMPAZINE) 10 MG tablet Take 1 tablet (10 mg total) by mouth every 6 (six) hours as needed. (Patient taking differently: Take 10 mg by mouth every 6 (six) hours as needed for nausea or vomiting.)   REPATHA SURECLICK 140 MG/ML SOAJ Inject 140 mg into the skin every 14 (fourteen) days.   senna-docusate (SENNA S) 8.6-50 MG tablet Take 2 tablets by mouth at bedtime.   Wheat Dextrin (BENEFIBER DRINK MIX) PACK Take 1 packet by mouth daily.   [DISCONTINUED] carvedilol (COREG) 12.5 MG tablet Take 1 tablet (12.5 mg total) by mouth 2 (two) times daily with a meal.   [DISCONTINUED] isosorbide mononitrate (IMDUR) 30 MG 24 hr tablet Take 0.5 tablets (15 mg total) by mouth daily.     Allergies:   Hydrocodone-acetaminophen, Other, Penicillins, Lactose, Vicodin [hydrocodone-acetaminophen], Bacid, and Ipratropium-albuterol   Social History    Socioeconomic History   Marital status: Widowed    Spouse name: Not on file   Number of children: 3   Years of education: Not on file   Highest education level: Not on file  Occupational History    Employer: [employer]  Tobacco Use   Smoking status: Never    Passive exposure: Past   Smokeless tobacco: Never  Vaping Use   Vaping status: Never Used  Substance and Sexual Activity   Alcohol use: Not Currently    Comment: up to 3 drinks per week   Drug use: No    Comment: CBD oil    Sexual activity: Not Currently    Comment: Hysterectomy  Other Topics Concern   Not on file  Social History Narrative   Not on file   Social Drivers of Health   Financial Resource Strain: Low Risk  (07/22/2019)   Overall Financial Resource Strain (CARDIA)    Difficulty of Paying Living Expenses: Not hard at all  Food Insecurity: No Food Insecurity (09/07/2023)   Hunger Vital Sign    Worried About Running Out of Food in the Last Year: Never true    Ran Out of Food in the Last Year: Never true  Transportation Needs: No Transportation Needs (09/07/2023)   PRAPARE - Administrator, Civil Service (Medical): No    Lack of Transportation (Non-Medical): No  Physical Activity: Sufficiently Active (07/22/2019)   Exercise Vital Sign    Days of Exercise per Week: 5 days    Minutes of Exercise per Session: 30 min  Stress: Not on file  Social Connections: Socially Integrated (09/07/2023)   Social Connection and Isolation Panel [NHANES]    Frequency of Communication with Friends and Family: Three times a week    Frequency of Social Gatherings with Friends and Family: Once a week    Attends Religious Services: More than 4 times per year    Active Member of [employer] or Organizations: Yes    Attends Engineer, structural: More than 4 times per year    Marital Status: Living with partner     Family History: The patient's family history includes Heart disease in her brother and sister; Lung cancer  in an other family member.  ROS:   Please see the history of present illness.     All other systems reviewed and are negative.  EKGs/Labs/Other Studies Reviewed:    The following studies were reviewed today:   EKG:   06/21/21: Normal sinus rhythm, rate 70, no ST abnormality 03/23/22: Normal sinus rhythm, rate 72, no ST abnormalities 09/26/2023: Normal sinus rhythm, rate 82, no ST abnormalities  Cath 06/16/19: Prox LAD lesion is 25% stenosed. Prox RCA lesion is 95% stenosed. A drug-eluting stent was successfully placed using a SYNERGY XD 2.25X28, postdilated to 2.5 mm. Post intervention, there is a 0% residual stenosis. The left ventricular systolic function is normal. LV end diastolic pressure is normal. The left ventricular ejection fraction is 55-65% by visual estimate. There is no aortic valve stenosis.   Continue dual antiplatelet therapy.  Would check with Pharmacy about interactions of Plavix with Tagrisso.  If there is an interaction, would switch to Brilinta.        TTE 06/13/19:  1. Left ventricular ejection fraction, by visual estimation, is 55 to  60%. The left ventricle has normal function. There is mildly increased  left ventricular hypertrophy.   2. The left ventricle has no regional wall motion abnormalities.   3. Global right ventricle has low normal systolic function.The right  ventricular size is normal. No increase in right ventricular wall  thickness.   4. Left atrial size was normal.   5. Right atrial size was normal.   6. Mild mitral annular calcification.   7. The mitral valve is degenerative. Moderate mitral valve regurgitation.   8. The tricuspid valve is grossly normal.   9. The aortic valve is tricuspid. Aortic valve regurgitation is not  visualized. No evidence of aortic valve sclerosis or stenosis.  10. The pulmonic valve was grossly normal. Pulmonic valve regurgitation is  not visualized.  11. Normal pulmonary artery systolic pressure.  12.  The inferior vena cava is normal in size with greater than 50%  respiratory variability, suggesting right atrial pressure of 3 mmHg.   Carotid duplex 08/01/19: Right Carotid: There is no evidence of stenosis in the right ICA. The                 extracranial vessels were near-normal with only minimal  wall                 thickening or plaque.   Left Carotid: Velocities in the left ICA are consistent with a 1-39%  stenosis.                Non-hemodynamically significant plaque <50% noted in the  CCA.   Vertebrals:  Bilateral vertebral arteries demonstrate antegrade flow.  Subclavians: Normal flow hemodynamics were seen in bilateral subclavian               arteries.   Recent Labs: 09/11/2023: ALT 15; BUN 16; Creatinine 1.22; Hemoglobin 11.9; Platelet Count 160; Potassium 4.0; Sodium 138  Recent Lipid Panel    Component Value Date/Time   CHOL 175 02/12/2021 0559   CHOL 153 11/01/2020 0839   TRIG 110 02/12/2021 0559   HDL 62 02/12/2021 0559   HDL 64 11/01/2020 0839   CHOLHDL 2.8 02/12/2021 0559   VLDL 22 02/12/2021 0559   LDLCALC 91 02/12/2021 0559   LDLCALC 74 11/01/2020 0839    Physical Exam:    VS:  BP (!) 175/85 (BP Location: Right Arm, Patient Position: Sitting, Cuff Size: Normal)   Pulse 84   Ht 5\' 4"  (1.626 m)   Wt 123 lb 12.8 oz (56.2 kg)   LMP  (LMP Unknown)   BMI 21.25  kg/m     Wt Readings from Last 3 Encounters:  09/26/23 123 lb 12.8 oz (56.2 kg)  09/19/23 127 lb (57.6 kg)  09/11/23 132 lb 9.6 oz (60.1 kg)     GEN:  Well nourished, well developed in no acute distress HEENT: Normal NECK: No JVD CARDIAC: RRR, no murmurs, rubs, gallops RESPIRATORY:  Clear to auscultation without rales, wheezing or rhonchi  ABDOMEN: Soft, non-tender, non-distended MUSCULOSKELETAL:  No edema SKIN: Warm and dry NEUROLOGIC:  Alert and oriented x 3 PSYCHIATRIC:  Normal affect   ASSESSMENT:    1. CAD in native artery   2. Cerebrovascular accident (CVA), unspecified  mechanism (HCC)   3. SOB (shortness of breath)   4. Essential hypertension   5. Hyperlipidemia, unspecified hyperlipidemia type      PLAN:    CAD: Status post drug-eluting stent to RCA on 06/16/2019.  At clinic visit in September 2021, she reported chest pains.  Lexiscan Myoview on 03/19/2020 showed normal perfusion, EF 70%.  She reported exertional chest pain and repeat Myoview 03/29/2021 showed normal perfusion, EF 68%. -She stopped taking aspirin due to GI symptoms.  Okay to discontinue aspirin given she is on therapeutic Lovenox -BP elevated, she has only been taking her Coreg once daily.  Recommend increasing to 6.25 mg twice daily.  Will also increase Imdur to 30 mg daily -Has had issues starting statins due to transaminitis and myalgias.  Referred to lipid clinic and started on Praluent in September 2021.  Now on Repatha -She is reporting dyspnea with minimal exertion as well as atypical chest pain.  Chest pain is pleuritic, less suspicion for angina.  Check echocardiogram.  Plan to increase Coreg and Imdur as above.  Discussed stress test but as patient has stage IV lung cancer and is seeing palliative care, her preference was to hold off on stress testing at this point  DVT/PE: admitted to [facility] 02/08/2021 with acute DVT/PE.  She was started on Eliquis, but switched to therapeutic Lovenox after her CVA 02/10/2021 per oncology  CVA: admitted back to [facility] on 02/11/2021 with acute CVA.  This was thought to be embolic in setting of hypercoagulable state due to stage IV lung cancer.  Oncology recommended switching from Eliquis to Lovenox for anticoagulation.  Palpitations: Description concerning for arrhythmia.  Zio patch x13 days on 01/07/2020 showed 3 episodes of NSVT longest lasting 7 beats, 17 episodes of SVT, longest lasting 15 seconds.  Continue carvedilol.  Carotid bruit: 1 to 39% left carotid artery stenosis, no stenosis in right coronary artery on carotid duplex  08/01/2019.  Hyperlipidemia: LDL 148 on 06/13/2019, improved to LDL 44 08/15/19 while on Lipitor 80 mg daily, but was discontinued due to transaminitis.  She also reported having myalgias with atorvastatin.  Referred to lipid clinic, started on rosuvastatin 5 mg every other day but developed myalgias.  Was reduced to twice weekly but continued to have myalgias so was discontinued.  Started on Praluent September 2021.  LDL 80 on 06/15/2020, Praluent dose increased to 150 mg every 14 days.  LDL 91 on 02/12/21.  Check lipid panel  Hypertension: On carvedilol 6.25 mg but only taking once daily and Imdur 15 mg daily.  BP elevated, recommend taking carvedilol twice daily.  Also increase Imdur to 30 mg daily to see if helps with chest pain as above.  Asked to check BP daily for next 2 weeks and let us know results  Stage IV lung cancer: Followed by oncology.  Has been on Tagrisso.  CT showed progression of disease, now on chemotherapy.  Following with palliative  Syncope: Description suggest orthostasis.  Zio patch x14 days on 07/21/2021 showed no significant arrhythmias.  RTC in 6 months   Medication Adjustments/Labs and Tests Ordered: Current medicines are reviewed at length with the patient today.  Concerns regarding medicines are outlined above.  Orders Placed This Encounter  Procedures   Lipid panel   EKG 12-Lead   ECHOCARDIOGRAM COMPLETE    Meds ordered this encounter  Medications   carvedilol (COREG) 6.25 MG tablet    Sig: Take 1 tablet (6.25 mg total) by mouth 2 (two) times daily.    Dispense:  180 tablet    Refill:  3   isosorbide mononitrate (IMDUR) 30 MG 24 hr tablet    Sig: Take 1 tablet (30 mg total) by mouth daily.    Dispense:  90 tablet    Refill:  3     Patient Instructions  Medication Instructions:  Start Coreg 6.125 twice a day Start Imdur 30mg  daily Continue all current medicaitons *If you need a refill on your cardiac medications before your next appointment, please  call your pharmacy*  Lab Work: Lipid panel with bnext schedule labs at [facility] If you have labs (blood work) drawn today and your tests are completely normal, you will receive your results only by: MyChart Message (if you have MyChart) OR A paper copy in the mail If you have any lab test that is abnormal or we need to change your treatment, we will call you to review the results.  Testing/Procedures: Echo Your physician has requested that you have an echocardiogram. Echocardiography is a painless test that uses sound waves to create images of your heart. It provides your doctor with information about the size and shape of your heart and how well your heart's chambers and valves are working. This procedure takes approximately one hour. There are no restrictions for this procedure. Please do NOT wear cologne, perfume, aftershave, or lotions (deodorant is allowed). Please arrive 15 minutes prior to your appointment time.  Please note: We ask at that you not bring children with you during ultrasound (echo/ vascular) testing. Due to room size and safety concerns, children are not allowed in the ultrasound rooms during exams. Our front office staff cannot provide observation of children in our lobby area while testing is being conducted. An adult accompanying a patient to their appointment will only be allowed in the ultrasound room at the discretion of the ultrasound technician under special circumstances. We apologize for any inconvenience.   Follow-Up: At [facility], you and your health needs are our priority.  As part of our continuing mission to provide you with exceptional heart care, our providers are all part of one team.  This team includes your primary Cardiologist (physician) and Advanced Practice Providers or APPs (Physician Assistants and Nurse Practitioners) who all work together to provide you with the care you need, when you need it.  Your next appointment:   4  month(s)  Provider:   Little Ishikawa, MD     We recommend signing up for the patient portal called "MyChart".  Sign up information is provided on this After Visit Summary.  MyChart is used to connect with patients for Virtual Visits (Telemedicine).  Patients are able to view lab/test results, encounter notes, upcoming appointments, etc.  Non-urgent messages can be sent to your provider as well.   To learn more about what you can do with MyChart,  go to [email or web address].   Other Instructions Please check blood pressure once daily for next 2 weeks and send those reading via mychart      1st Floor: - Lobby - Registration  - Pharmacy  - Lab - Cafe  2nd Floor: - PV Lab - Diagnostic Testing (echo, CT, nuclear med)  3rd Floor: - Vacant  4th Floor: - TCTS (cardiothoracic surgery) - AFib Clinic - Structural Heart Clinic - Vascular Surgery  - Vascular Ultrasound  5th Floor: - HeartCare Cardiology (general and EP) - Clinical Pharmacy for coumadin, hypertension, lipid, weight-loss medications, and med management appointments    Valet parking services will be available as well.      Signed, Little Ishikawa, MD  09/26/2023 8:50 AM    [facility]

## 2023-09-26 NOTE — Patient Instructions (Signed)
 Medication Instructions:  Start Coreg 6.125 twice a day Start Imdur 30mg  daily Continue all current medicaitons *If you need a refill on your cardiac medications before your next appointment, please call your pharmacy*  Lab Work: Lipid panel with bnext schedule labs at [facility] If you have labs (blood work) drawn today and your tests are completely normal, you will receive your results only by: MyChart Message (if you have MyChart) OR A paper copy in the mail If you have any lab test that is abnormal or we need to change your treatment, we will call you to review the results.  Testing/Procedures: Echo Your physician has requested that you have an echocardiogram. Echocardiography is a painless test that uses sound waves to create images of your heart. It provides your doctor with information about the size and shape of your heart and how well your heart's chambers and valves are working. This procedure takes approximately one hour. There are no restrictions for this procedure. Please do NOT wear cologne, perfume, aftershave, or lotions (deodorant is allowed). Please arrive 15 minutes prior to your appointment time.  Please note: We ask at that you not bring children with you during ultrasound (echo/ vascular) testing. Due to room size and safety concerns, children are not allowed in the ultrasound rooms during exams. Our front office staff cannot provide observation of children in our lobby area while testing is being conducted. An adult accompanying a patient to their appointment will only be allowed in the ultrasound room at the discretion of the ultrasound technician under special circumstances. We apologize for any inconvenience.   Follow-Up: At [facility], you and your health needs are our priority.  As part of our continuing mission to provide you with exceptional heart care, our providers are all part of one team.  This team includes your primary Cardiologist (physician)  and Advanced Practice Providers or APPs (Physician Assistants and Nurse Practitioners) who all work together to provide you with the care you need, when you need it.  Your next appointment:   4 month(s)  Provider:   Little Ishikawa, MD     We recommend signing up for the patient portal called "MyChart".  Sign up information is provided on this After Visit Summary.  MyChart is used to connect with patients for Virtual Visits (Telemedicine).  Patients are able to view lab/test results, encounter notes, upcoming appointments, etc.  Non-urgent messages can be sent to your provider as well.   To learn more about what you can do with MyChart, go to [email or web address].   Other Instructions Please check blood pressure once daily for next 2 weeks and send those reading via mychart      1st Floor: - Lobby - Registration  - Pharmacy  - Lab - Cafe  2nd Floor: - PV Lab - Diagnostic Testing (echo, CT, nuclear med)  3rd Floor: - Vacant  4th Floor: - TCTS (cardiothoracic surgery) - AFib Clinic - Structural Heart Clinic - Vascular Surgery  - Vascular Ultrasound  5th Floor: - HeartCare Cardiology (general and EP) - Clinical Pharmacy for coumadin, hypertension, lipid, weight-loss medications, and med management appointments    Valet parking services will be available as well.

## 2023-09-27 ENCOUNTER — Ambulatory Visit: Admitting: Physician Assistant

## 2023-09-27 ENCOUNTER — Other Ambulatory Visit

## 2023-09-27 ENCOUNTER — Encounter

## 2023-09-27 ENCOUNTER — Ambulatory Visit

## 2023-09-27 NOTE — Telephone Encounter (Signed)
 Spoke with patient regarding prior message per orders Allison [name]  I sent hycodan in 4/8, so she should have the medication. The forms need to be brought back to the office to be faxed in if they have been completed. Thanks so much   Patient stated she has picked up the medication and patient was advised to bring the paperwork back for [facility] o our office can fax the forms back.   Patient's voice was understanding.Noting else further needed.

## 2023-09-28 ENCOUNTER — Encounter (INDEPENDENT_AMBULATORY_CARE_PROVIDER_SITE_OTHER): Admitting: Ophthalmology

## 2023-09-28 DIAGNOSIS — H25812 Combined forms of age-related cataract, left eye: Secondary | ICD-10-CM

## 2023-09-28 DIAGNOSIS — H35033 Hypertensive retinopathy, bilateral: Secondary | ICD-10-CM

## 2023-09-28 DIAGNOSIS — Z961 Presence of intraocular lens: Secondary | ICD-10-CM

## 2023-09-28 DIAGNOSIS — H40053 Ocular hypertension, bilateral: Secondary | ICD-10-CM

## 2023-09-28 DIAGNOSIS — H35371 Puckering of macula, right eye: Secondary | ICD-10-CM

## 2023-09-28 DIAGNOSIS — I10 Essential (primary) hypertension: Secondary | ICD-10-CM

## 2023-09-29 NOTE — Progress Notes (Unsigned)
 [facility] OFFICE PROGRESS NOTE  Allison Hence, MD [address] [place] [address]  DIAGNOSIS: Stage IV (T2 a,N2, M1a) non-small cell lung cancer, adenocarcinoma diagnosed in July 2019 and presented with right upper lobe lung mass in addition to mediastinal lymphadenopathy as well as bilateral pulmonary nodules and malignant right pleural effusion.   Biomarker Findings Microsatellite status - Cannot Be Determined Tumor Mutational Burden - Cannot Be Determined Genomic Findings For a complete list of the genes assayed, please refer to the Appendix. EGFR exon 19 deletion ([id number]) TP53 Y220C 7 Disease relevant genes with no reportable alterations: KRAS, ALK, BRAF, MET, RET, ERBB2, ROS1    Repeat molecular studies in 2025 showed no new mutations  PRIOR THERAPY: 1) Status post right Pleurx catheter placement by Dr. Matt Song for drainage of malignant right pleural effusion. 2) palliative radiotherapy to the enlarging right upper lobe lung mass and mediastinum under the care of Dr. Eloise Hake expected to be completed on January 05, 2021. 3) Systemic chemotherapy with carboplatin for AUC of 5 and Alimta 500 Mg/M2 every 3 weeks.  First dose 03/17/2021.  The patient will also continue her current treatment with Tagrisso 80 mg p.o. daily.  She is status post 26 cycles. Starting from cycle #7, she will be on Alimta only 400 mg/m2.  Her treatment with Alimta has been on hold for several months now.   CURRENT THERAPY: Tagrisso 80 mg p.o. daily.  First dose was given on 01/29/2018.  Status post 63 months of treatment.  IV chemotherapy with alimta 400 mg/m2 IV every 3 weeks to start around 09/11/23  INTERVAL HISTORY: Allison [name] 73 y.o. female returns to the clinic today for follow-up visit accompanied by ***.  In summary, the patient was recently found to have disease progression.  Therefore she restarted systemic chemotherapy with single agent Alimta dose reduced  to 400 mg/m for tolerability.  She tolerated this ***  In summary, she has been experiencing a persistent cough and progressive weakness since January 2025.  The cough persist despite the use of hydrocodone cough syrup.  She is also on low-dose prednisone for her cough and appetite  Overall since resuming her chemotherapy she tolerated it ***  Home health?***  March thousand 25 the patient found to have upper extremity DVTs for which she is on Lovenox and compliant.  The swelling in her upper extremities has***  She continues to have right-sided rib discomfort secondary to coughing which is stable to slightly improved with her pain regimen.  She follows closely with palliative care.  She is currently taking ***for pain.  Moderate left effusion?  Pleurx catheter in the past?  Cough?  Her cough is worse when she is active or talks.  She uses Hycodan.  Taste and appetite?  She denies any fever or chills.  Weight loss?  Shortness of breath?  She denies any hemoptysis.  She denies any nausea, vomiting, diarrhea, or constipation.  She denies any headache or visual changes.  We previously discussed appetite stimulants but she is not a good candidate for Megace due to history of blood clots.  She is not a good candidate for Marinol due to her intolerance and feeling drowsy.  She is not a good candidate for Remeron due to drug interactions with Tagrisso.  She is here today for evaluation repeat blood work before undergoing cycle #2.    MEDICAL HISTORY: Past Medical History:  Diagnosis Date   Anemia    Anxiety  Arthritis    Asthma    exercise induced   Coronary artery disease    Depression    PMH   Dyspnea    GERD (gastroesophageal reflux disease)    Glaucoma    History of radiation therapy 01/05/2021   IMRT right lung  11/24/2020-01/05/2021  Dr Retta Caster   Hypertension    lung ca dx'd 11/2017   right   Malignant pleural effusion    right   Nuclear sclerotic cataract of right eye  02/28/2021   Dr. Devin Foerster, cataract surgery February 2023   PONV (postoperative nausea and vomiting)    Pre-diabetes    Raynaud's disease    Raynaud's disease    Stroke (HCC) 01/2021   balance off, some express aphasia, weakness    ALLERGIES:  is allergic to hydrocodone-acetaminophen, other, penicillins, lactose, vicodin [hydrocodone-acetaminophen], bacid, and ipratropium-albuterol.  MEDICATIONS:  Current Outpatient Medications  Medication Sig Dispense Refill   ALPRAZolam (XANAX) 0.25 MG tablet Take 1 tablet (0.25 mg total) by mouth 2 (two) times daily as needed for anxiety. May take 2 tablets (0.5 mg total) at bedtime. (Patient taking differently: Take 0.25-0.5 mg by mouth 2 (two) times daily as needed for anxiety or sleep.) 60 tablet 0   [Paused] amLODipine (NORVASC) 5 MG tablet Take 1 tablet (5 mg total) by mouth daily. (Patient not taking: Reported on 09/19/2023) 90 tablet 3   benzonatate (TESSALON) 100 MG capsule Take 2 capsules (200 mg total) by mouth 3 (three) times daily as needed for cough. (Patient not taking: Reported on 09/07/2023) 60 capsule 2   carvedilol (COREG) 6.25 MG tablet Take 1 tablet (6.25 mg total) by mouth 2 (two) times daily. 180 tablet 3   CHELATED MAGNESIUM PO Take 200 mg by mouth daily. (Patient not taking: Reported on 09/07/2023)     Cholecalciferol (VITAMIN D) 125 MCG (5000 UT) CAPS Take 1 Capful by mouth daily. (Patient not taking: Reported on 09/07/2023)     dexamethasone (DECADRON) 4 MG tablet Take 1 tablet twice a day the day before, day of, and day after treatment (Patient not taking: Reported on 09/07/2023) 40 tablet 2   docusate sodium (COLACE) 100 MG capsule Take 1 capsule (100 mg total) by mouth 2 (two) times daily. (Patient not taking: Reported on 09/19/2023) 60 capsule 5   dorzolamide-timolol (COSOPT) 22.3-6.8 MG/ML ophthalmic solution Place 1 drop into both eyes 2 (two) times daily.     doxycycline (VIBRA-TABS) 100 MG tablet Take 1 tablet (100 mg  total) by mouth 2 (two) times daily. 14 tablet 0   enoxaparin (LOVENOX) 60 MG/0.6ML injection Inject 0.55 mLs (55 mg total) into the skin every 12 (twelve) hours. 100 mL 1   ezetimibe (ZETIA) 10 MG tablet Take 10 mg by mouth daily.     FeFum-FePoly-FA-B Cmp-C-Biot (FOLIVANE-PLUS) CAPS Take 1 capsule by mouth in the morning. 90 capsule 0   fentaNYL (DURAGESIC) 12 MCG/HR Place 1 patch onto the skin every 3 (three) days. 10 patch 0   FLUoxetine (PROZAC) 20 MG capsule Take 1 capsule (20 mg total) by mouth daily. 90 capsule 3   folic acid (FOLVITE) 1 MG tablet TAKE 1 TABLET BY MOUTH EVERY DAY 90 tablet 1   gabapentin (NEURONTIN) 250 MG/5ML solution Take 4 mLs (200 mg total) by mouth at bedtime. 470 mL 1   Glycopyrrolate-Formoterol (BEVESPI AEROSPHERE) 9-4.8 MCG/ACT AERO Inhale 2 puffs into the lungs 2 (two) times daily. (Patient not taking: Reported on 09/19/2023) 10.7 g 3   HYDROcodone  bit-homatropine (HYCODAN) 5-1.5 MG/5ML syrup Take 5 mLs by mouth every 6 (six) hours as needed for cough. (Patient not taking: Reported on 09/26/2023) 240 mL 0   hyoscyamine (LEVSIN SL) 0.125 MG SL tablet Place 1-2 tablets (0.125-0.25mg ) under the tongue every 6 (six) hours as needed for cramping. 30 tablet 3   ipratropium-albuterol (DUONEB) 0.5-2.5 (3) MG/3ML SOLN Take 3 mLs by nebulization every 6 (six) hours as needed (Asthma). (Patient not taking: Reported on 09/19/2023) 360 mL 3   isosorbide mononitrate (IMDUR) 30 MG 24 hr tablet Take 1 tablet (30 mg total) by mouth daily. 90 tablet 3   latanoprost (XALATAN) 0.005 % ophthalmic solution Place 1 drop into both eyes at bedtime.      nystatin (MYCOSTATIN) 100000 UNIT/ML suspension Take 5 mLs (500,000 Units total) by mouth 4 (four) times daily. (Patient not taking: Reported on 09/19/2023) 60 mL 0   ondansetron (ZOFRAN-ODT) 4 MG disintegrating tablet Take 1 tablet (4 mg total) by mouth every 8 (eight) hours as needed for nausea or vomiting. (Patient taking differently: Take 4 mg by  mouth every 8 (eight) hours as needed for nausea or vomiting (dissolve orally).) 30 tablet 1   osimertinib mesylate (TAGRISSO) 80 MG tablet Take 1 tablet (80 mg total) by mouth daily. 30 tablet 4   oxyCODONE (OXY IR/ROXICODONE) 5 MG immediate release tablet Take 1-2 tablets (5-10 mg total) by mouth every 4 (four) hours as needed for severe pain (pain score 7-10) or breakthrough pain. 60 tablet 0   PEPCID AC 10 MG tablet Take 10 mg by mouth 2 (two) times daily.     predniSONE (DELTASONE) 10 MG tablet Take 1 tablet (10 mg total) by mouth daily with breakfast. 30 tablet 2   prochlorperazine (COMPAZINE) 10 MG tablet Take 1 tablet (10 mg total) by mouth every 6 (six) hours as needed. (Patient taking differently: Take 10 mg by mouth every 6 (six) hours as needed for nausea or vomiting.) 30 tablet 2   REPATHA SURECLICK 140 MG/ML SOAJ Inject 140 mg into the skin every 14 (fourteen) days. 6 mL 3   senna-docusate (SENNA S) 8.6-50 MG tablet Take 2 tablets by mouth at bedtime. 60 tablet 3   Wheat Dextrin (BENEFIBER DRINK MIX) PACK Take 1 packet by mouth daily.     No current facility-administered medications for this visit.    SURGICAL HISTORY:  Past Surgical History:  Procedure Laterality Date   ABDOMINAL HYSTERECTOMY     partial   BRONCHIAL BIOPSY  04/21/2021   Procedure: BRONCHIAL BIOPSIES;  Surgeon: Prudy Brownie, DO;  Location: [facility];  Service: Pulmonary;;   BRONCHIAL BRUSHINGS  04/21/2021   Procedure: BRONCHIAL BRUSHINGS;  Surgeon: Prudy Brownie, DO;  Location: [facility];  Service: Pulmonary;;   BRONCHIAL NEEDLE ASPIRATION BIOPSY  04/21/2021   Procedure: BRONCHIAL NEEDLE ASPIRATION BIOPSIES;  Surgeon: Prudy Brownie, DO;  Location: [facility];  Service: Pulmonary;;   CHEST TUBE INSERTION Right 01/01/2018   Procedure: INSERTION PLEURAL DRAINAGE CATHETER;  Surgeon: Heriberto London, MD;  Location: [facility] OR;  Service: Thoracic;  Laterality: Right;   CHEST TUBE INSERTION  04/21/2021    Procedure: CHEST TUBE INSERTION;  Surgeon: Prudy Brownie, DO;  Location: [facility];  Service: Pulmonary;;   COLONOSCOPY     CORONARY STENT INTERVENTION N/A 06/16/2019   Procedure: CORONARY STENT INTERVENTION;  Surgeon: Lucendia Rusk, MD;  Location: [facility] INVASIVE CV LAB;  Service: Cardiovascular;  Laterality: N/A;   DILATION AND CURETTAGE OF UTERUS  EYE SURGERY     due to Glaucoma   IR IMAGING GUIDED PORT INSERTION  03/22/2021   IR PORT REPAIR CENTRAL VENOUS ACCESS DEVICE  04/15/2021   LEFT HEART CATH AND CORONARY ANGIOGRAPHY N/A 06/16/2019   Procedure: LEFT HEART CATH AND CORONARY ANGIOGRAPHY;  Surgeon: Lucendia Rusk, MD;  Location: [facility] INVASIVE CV LAB;  Service: Cardiovascular;  Laterality: N/A;   REMOVAL OF PLEURAL DRAINAGE CATHETER Right 11/07/2018   Procedure: REMOVAL OF PLEURAL DRAINAGE CATHETER;  Surgeon: Heriberto London, MD;  Location: [facility] OR;  Service: Thoracic;  Laterality: Right;   ROTATOR CUFF REPAIR     TUBAL LIGATION     VIDEO BRONCHOSCOPY WITH ENDOBRONCHIAL NAVIGATION Bilateral 04/21/2021   Procedure: VIDEO BRONCHOSCOPY WITH ENDOBRONCHIAL NAVIGATION;  Surgeon: Prudy Brownie, DO;  Location: [facility];  Service: Pulmonary;  Laterality: Bilateral;  ION   WISDOM TOOTH EXTRACTION      REVIEW OF SYSTEMS:   Review of Systems  Constitutional: Negative for appetite change, chills, fatigue, fever and unexpected weight change.  HENT:   Negative for mouth sores, nosebleeds, sore throat and trouble swallowing.   Eyes: Negative for eye problems and icterus.  Respiratory: Negative for cough, hemoptysis, shortness of breath and wheezing.   Cardiovascular: Negative for chest pain and leg swelling.  Gastrointestinal: Negative for abdominal pain, constipation, diarrhea, nausea and vomiting.  Genitourinary: Negative for bladder incontinence, difficulty urinating, dysuria, frequency and hematuria.   Musculoskeletal: Negative for back pain, gait problem, neck pain and neck  stiffness.  Skin: Negative for itching and rash.  Neurological: Negative for dizziness, extremity weakness, gait problem, headaches, light-headedness and seizures.  Hematological: Negative for adenopathy. Does not bruise/bleed easily.  Psychiatric/Behavioral: Negative for confusion, depression and sleep disturbance. The patient is not nervous/anxious.     PHYSICAL EXAMINATION:  There were no vitals taken for this visit.  ECOG PERFORMANCE STATUS: {CHL ONC ECOG [id number]  Physical Exam  Constitutional: Oriented to person, place, and time and well-developed, well-nourished, and in no distress. No distress.  HENT:  Head: Normocephalic and atraumatic.  Mouth/Throat: Oropharynx is clear and moist. No oropharyngeal exudate.  Eyes: Conjunctivae are normal. Right eye exhibits no discharge. Left eye exhibits no discharge. No scleral icterus.  Neck: Normal range of motion. Neck supple.  Cardiovascular: Normal rate, regular rhythm, normal heart sounds and intact distal pulses.   Pulmonary/Chest: Effort normal and breath sounds normal. No respiratory distress. No wheezes. No rales.  Abdominal: Soft. Bowel sounds are normal. Exhibits no distension and no mass. There is no tenderness.  Musculoskeletal: Normal range of motion. Exhibits no edema.  Lymphadenopathy:    No cervical adenopathy.  Neurological: Alert and oriented to person, place, and time. Exhibits normal muscle tone. Gait normal. Coordination normal.  Skin: Skin is warm and dry. No rash noted. Not diaphoretic. No erythema. No pallor.  Psychiatric: Mood, memory and judgment normal.  Vitals reviewed.  LABORATORY DATA: Lab Results  Component Value Date   WBC 9.5 09/11/2023   HGB 11.9 (L) 09/11/2023   HCT 37.0 09/11/2023   MCV 82.2 09/11/2023   PLT 160 09/11/2023      Chemistry      Component Value Date/Time   NA 138 09/11/2023 1246   NA 140 03/04/2021 1516   K 4.0 09/11/2023 1246   CL 106 09/11/2023 1246   CO2 26  09/11/2023 1246   BUN 16 09/11/2023 1246   BUN 21 03/04/2021 1516   CREATININE 1.22 (H) 09/11/2023 1246  Component Value Date/Time   CALCIUM 9.5 09/11/2023 1246   ALKPHOS 65 09/11/2023 1246   AST 17 09/11/2023 1246   ALT 15 09/11/2023 1246   BILITOT 0.3 09/11/2023 1246       RADIOGRAPHIC STUDIES:  DG Chest 2 View Result Date: 09/19/2023 CLINICAL DATA:  Dyspnea.  History of right lung cancer. EXAM: CHEST - 2 VIEW COMPARISON:  09/07/2023 FINDINGS: Right IJ Port-A-Cath unchanged. Mild opacification over the right lung mild volume loss unchanged. Left lung is clear. Cardiomediastinal silhouette and remainder of the exam is unchanged. IMPRESSION: 1. No acute cardiopulmonary disease. 2. Opacification of the right lung with mild volume loss unchanged. Electronically Signed   By: Roda Cirri M.D.   On: 09/19/2023 13:21   UE VENOUS DUPLEX (7am - 7pm) Result Date: 09/08/2023 UPPER VENOUS STUDY  Patient Name:  Allison [name] Cohea  Date of Exam:   09/07/2023 Medical Rec #: [id number]        Accession #:    [id number] Date of Birth: 1950-08-08        Patient Gender: F Patient Age:   29 years Exam Location:  [facility] Procedure:      VAS US  UPPER EXTREMITY VENOUS DUPLEX Referring Phys: Hiawatha Lout --------------------------------------------------------------------------------  Indications: Swelling Risk Factors: History of PE (2022) Lung Cancer / Chemotherapy / Right side port placement. Anticoagulation: Lovenox. Comparison Study: No previous UE venous exams Performing Technologist: Jody Hill RVT, RDMS  Examination Guidelines: A complete evaluation includes B-mode imaging, spectral Doppler, color Doppler, and power Doppler as needed of all accessible portions of each vessel. Bilateral testing is considered an integral part of a complete examination. Limited examinations for reoccurring indications may be performed as noted.  Right Findings:  +----------+------------+---------+-----------+----------+-----------------+ RIGHT     CompressiblePhasicitySpontaneousProperties     Summary      +----------+------------+---------+-----------+----------+-----------------+ IJV         Partial      Yes       Yes              Age Indeterminate +----------+------------+---------+-----------+----------+-----------------+ Subclavian    None       No        No                     Acute       +----------+------------+---------+-----------+----------+-----------------+ Axillary      None       No        No                     Acute       +----------+------------+---------+-----------+----------+-----------------+ Brachial      None       No        No                     Acute       +----------+------------+---------+-----------+----------+-----------------+ Radial        Full                                                    +----------+------------+---------+-----------+----------+-----------------+ Ulnar  patent by color  +----------+------------+---------+-----------+----------+-----------------+ Cephalic      None       No        No                     Acute       +----------+------------+---------+-----------+----------+-----------------+ Basilic       None       No        No                     Acute       +----------+------------+---------+-----------+----------+-----------------+  Left Findings: +----------+------------+---------+-----------+----------+-----------------+ LEFT      CompressiblePhasicitySpontaneousProperties     Summary      +----------+------------+---------+-----------+----------+-----------------+ IJV           None       No        No                     Acute       +----------+------------+---------+-----------+----------+-----------------+ Subclavian    None       No        No                     Acute        +----------+------------+---------+-----------+----------+-----------------+ Axillary      None       No        No               Age Indeterminate +----------+------------+---------+-----------+----------+-----------------+ Brachial      None       No        No                     Acute       +----------+------------+---------+-----------+----------+-----------------+ Radial        Full                                                    +----------+------------+---------+-----------+----------+-----------------+ Ulnar         Full                                                    +----------+------------+---------+-----------+----------+-----------------+ Cephalic      Full                                                    +----------+------------+---------+-----------+----------+-----------------+ Basilic       None       No        No               Age Indeterminate +----------+------------+---------+-----------+----------+-----------------+  Summary:  Right: Findings consistent with acute deep vein thrombosis involving the right subclavian vein, right axillary vein and right brachial veins. Findings consistent with acute superficial vein thrombosis involving the right basilic vein and right cephalic vein. Findings consistent with age indeterminate deep vein thrombosis involving the right internal jugular vein.  Left: Findings consistent  with acute deep vein thrombosis involving the left internal jugular vein, left subclavian vein and left brachial veins. Findings consistent with age indeterminate deep vein thrombosis involving the left axillary vein. Findings consistent with age indeterminate superficial vein thrombosis involving the left basilic vein.  *See table(s) above for measurements and observations.  Diagnosing physician: Angela Kell MD Electronically signed by Angela Kell MD on 09/08/2023 at 9:38:23 AM.    Final    CT Chest Wo Contrast Result Date:  09/07/2023 CLINICAL DATA:  History of right lung cancer presenting with chest pain after choking episode on cough syrup associated with shortness of breath. * Tracking Code: BO * EXAM: CT CHEST WITHOUT CONTRAST TECHNIQUE: Multidetector CT imaging of the chest was performed following the standard protocol without IV contrast. RADIATION DOSE REDUCTION: This exam was performed according to the departmental dose-optimization program which includes automated exposure control, adjustment of the mA and/or kV according to patient size and/or use of iterative reconstruction technique. COMPARISON:  Nuclear medicine PET dated 08/13/2023, CT chest dated 07/25/2023 FINDINGS: Cardiovascular: Right chest wall port terminates at the superior cavoatrial junction. Normal heart size. No significant pericardial fluid/thickening. Great vessels are normal in course and caliber. Coronary artery calcifications and aortic atherosclerosis. Mediastinum/Nodes: Imaged thyroid gland without nodules meeting criteria for imaging follow-up by size. Normal esophagus. Unchanged 12 mm subcarinal lymph node (2:25) Lungs/Pleura: The central airways are patent. Similar asymmetric narrowing of the right bronchi and bronchioles secondary to encasement by the peribronchovascular consolidation, which has increased compared to 08/13/2023. Interval increased nodular interlobular septal thickening throughout the right lung. New mild interlobular septal thickening of the left lung. New 1.4 x 1.4 cm left lower lobe nodule (4:119) contains internal hypodensities. New 8 mm lateral left apical nodule (4:22) and scattered ground-glass nodules, predominantly within the left upper lobe. No pneumothorax. Increased small right and moderate left pleural effusions. Upper abdomen: Normal. Musculoskeletal: Age indeterminate superior endplate compression deformity of T7, new compared to 07/25/2023. IMPRESSION: 1. Increased peribronchovascular consolidation throughout the  right lung with increased nodular interlobular septal thickening throughout the right lung, suspicious for progression of disease. 2. New 1.4 cm left lower lobe nodule contains internal hypodensities, indeterminate which may represent metastatic disease or a focus of lipoid pneumonia given reported aspiration. 3. New 8 mm lateral left apical nodule and scattered ground-glass nodules, predominantly within the left upper lobe, which may be infectious/inflammatory or metastatic. 4. Increased small right and moderate left pleural effusions. 5. Age indeterminate superior endplate compression deformity of T7, new compared to 07/25/2023. Aortic Atherosclerosis (ICD10-I70.0). Coronary artery calcifications. Assessment for potential risk factor modification, dietary therapy or pharmacologic therapy may be warranted, if clinically indicated. Electronically Signed   By: Limin  Xu M.D.   On: 09/07/2023 14:24   DG Chest Port 1 View Result Date: 09/07/2023 CLINICAL DATA:  Cough. EXAM: PORTABLE CHEST 1 VIEW COMPARISON:  08/06/2023. FINDINGS: Since the prior study, there is increasing opacification of right hemithorax with shift of mediastinal structures to the right. There is at least moderate layering right pleural effusion with associated compressive atelectatic changes in the right lung. Left lung and left lateral costophrenic angle are clear. No pneumothorax on either side. Evaluation of cardiomediastinal silhouette is nondiagnostic due to right lower hemithorax opacification. No acute osseous abnormalities. The soft tissues are within normal limits. Right-sided CT Port-A-Cath is seen with its tip overlying the midportion of superior vena cava, unchanged. IMPRESSION: Increasing opacification of right hemithorax with shift of mediastinal structures to the right. There is  at least moderate layering right pleural effusion with associated compressive atelectatic changes in the right lung. Electronically Signed   By: Beula Brunswick M.D.   On: 09/07/2023 11:48     ASSESSMENT/PLAN:  This is a very pleasant 73 year old never smoker African-American female diagnosed with stage IV non-small cell lung cancer, adenocarcinoma.  She was positive for an EGFR mutation with deletion in exon 19.  She was diagnosed in July 2019 and presented with right upper lobe lung mass in addition to mediastinal lymphadenopathy as well as bilateral pulmonary nodules and malignant right pleural effusion.   The patient was started on treatment with Tagrisso 80 mg p.o. daily status post 39 months of treatment. Started on 01/29/2018.   In April 2022, she showed evidence of disease progression with interval progression of the right apical lung mass in addition to progression of mediastinal lymphadenopathy concerning for worsening of her disease. She had repeat Guardant 360 molecular studies which did not show evidence of new resistant mutations.  Therefore, she was referred to radiation oncology and completed palliative radiotherapy to the enlarging right upper lobe lung mass and mediastinum under the care of Dr. Eloise Hake.  This was completed in July 2022.    The patient underwent a repeat bronchoscopy and biopsy for repeat molecular testing.  The sample from the left and right lung biopsy was negative for malignancy. This was performed in November 2022.   Unfortunately, the patient  was found to have evidence of disease progression.  Therefore, Dr. Marguerita Shih started the patient on systemic chemotherapy with carboplatin for AUC of 5 and Alimta 500 mg per metered squared.  She is status post 23 cycles and she tolerated it fairly well despite her ongoing issues with fatigue, chest tightness, cough, and weight loss.  Alimta was reduced to 400 mg per metered squared due to renal insufficiency.  She is not a good candidate for Avastin due to her recent CVA in August 2022.  She also is continuing to take Tagrisso as it is protective against progressive metastatic  disease to the brain. Starting from cycle #7,  she started maintenance single agent alimta. Her treatment has been on hold since March 2024 due to side effects of treatment.    Of note, the patient saw Dr. Baker Bon from [facility] for second opinion in November 2022.  They discussed if she progresses on chemotherapy that there may be upcoming options. He discussed other treatment options which may be available in 2023 including patritumab deruxtcan and lazertinib/amivantmab.   Her most recent restaging CT scan showed disease progression with new and increasing septal thickening and perilymphatic nodularity throughout the right hemithorax suspicious for lymphangitic spread of tumor.  A PET scan to confirm this.   Therefore, at her appointment on 08/23/2023 we discussed the options.  She had repeat molecular studies performed which did not show any new actionable mutations.  Therefore the plan will be to proceed with single agent Alimta IV every 3 weeks as scheduled.  The alternative options that we discussed was adding carboplatin to the Alimta.  We also talked about amivantamab and lazertinib, but the patient and Dr. Marguerita Shih have concerns about quality of life and side effect profile, although this likely would be effective   The patient opted to proceed on single agent chemotherapy with Alimta 400 mg/m.  She started this on 09/11/2023.  She tolerated this ***  The patient was seen with Dr. Marguerita Shih today.  Labs were reviewed.  Recommend that she ****with  cycle 2 today scheduled.  We will see her back for follow-up visit in 3 weeks for evaluation review blood work before undergoing cycle #3.  Her creatinine is ***today.  She will continue taking prednisone 10 mg daily.  She will continue taking Hycodan for cough.  She will continue her folic acid.  She will continue taking her Tagrisso for neuroprotection.  She will continue taking a PPI for heartburn.  She will continue to follow with  palliative care for pain management.  The patient will continue being compliant with her Lovenox due to her upper extremity DVTs.  At her last appointment we previously reviewed signs symptoms of PE that would warrant emergency room evaluation.  Home health?  The patient was advised to call immediately if she has any concerning symptoms in the interval. The patient voices understanding of current disease status and treatment options and is in agreement with the current care plan. All questions were answered. The patient knows to call the clinic with any problems, questions or concerns. We can certainly see the patient much sooner if necessary          No orders of the defined types were placed in this encounter.    I spent {CHL ONC TIME VISIT - NWGNF:[id number]} counseling the patient face to face. The total time spent in the appointment was {CHL ONC TIME VISIT - IONGE:[id number]}.  Amaiya Scruton L Izaak Sahr, PA-C 09/29/23

## 2023-10-01 ENCOUNTER — Encounter: Payer: Self-pay | Admitting: Cardiology

## 2023-10-02 ENCOUNTER — Inpatient Hospital Stay (HOSPITAL_BASED_OUTPATIENT_CLINIC_OR_DEPARTMENT_OTHER): Admitting: Physician Assistant

## 2023-10-02 ENCOUNTER — Encounter: Payer: Self-pay | Admitting: Nurse Practitioner

## 2023-10-02 ENCOUNTER — Inpatient Hospital Stay

## 2023-10-02 ENCOUNTER — Inpatient Hospital Stay: Attending: Internal Medicine

## 2023-10-02 ENCOUNTER — Other Ambulatory Visit: Payer: Self-pay

## 2023-10-02 ENCOUNTER — Inpatient Hospital Stay (HOSPITAL_BASED_OUTPATIENT_CLINIC_OR_DEPARTMENT_OTHER): Admitting: Nurse Practitioner

## 2023-10-02 ENCOUNTER — Other Ambulatory Visit (HOSPITAL_COMMUNITY): Payer: Self-pay

## 2023-10-02 VITALS — BP 110/64 | HR 79 | Resp 16

## 2023-10-02 VITALS — BP 193/92 | HR 85 | Temp 98.1°F | Resp 18 | Ht 64.0 in | Wt 122.0 lb

## 2023-10-02 DIAGNOSIS — R630 Anorexia: Secondary | ICD-10-CM

## 2023-10-02 DIAGNOSIS — Z5111 Encounter for antineoplastic chemotherapy: Secondary | ICD-10-CM | POA: Insufficient documentation

## 2023-10-02 DIAGNOSIS — C3490 Malignant neoplasm of unspecified part of unspecified bronchus or lung: Secondary | ICD-10-CM

## 2023-10-02 DIAGNOSIS — G893 Neoplasm related pain (acute) (chronic): Secondary | ICD-10-CM | POA: Diagnosis not present

## 2023-10-02 DIAGNOSIS — I159 Secondary hypertension, unspecified: Secondary | ICD-10-CM | POA: Diagnosis not present

## 2023-10-02 DIAGNOSIS — Z923 Personal history of irradiation: Secondary | ICD-10-CM | POA: Insufficient documentation

## 2023-10-02 DIAGNOSIS — C3411 Malignant neoplasm of upper lobe, right bronchus or lung: Secondary | ICD-10-CM | POA: Insufficient documentation

## 2023-10-02 DIAGNOSIS — Z515 Encounter for palliative care: Secondary | ICD-10-CM | POA: Diagnosis not present

## 2023-10-02 DIAGNOSIS — R530 Neoplastic (malignant) related fatigue: Secondary | ICD-10-CM

## 2023-10-02 DIAGNOSIS — Z95828 Presence of other vascular implants and grafts: Secondary | ICD-10-CM

## 2023-10-02 DIAGNOSIS — R634 Abnormal weight loss: Secondary | ICD-10-CM

## 2023-10-02 DIAGNOSIS — K5903 Drug induced constipation: Secondary | ICD-10-CM | POA: Diagnosis not present

## 2023-10-02 DIAGNOSIS — C3491 Malignant neoplasm of unspecified part of right bronchus or lung: Secondary | ICD-10-CM

## 2023-10-02 LAB — CMP (CANCER CENTER ONLY)
ALT: 22 U/L (ref 0–44)
AST: 17 U/L (ref 15–41)
Albumin: 3.5 g/dL (ref 3.5–5.0)
Alkaline Phosphatase: 64 U/L (ref 38–126)
Anion gap: 6 (ref 5–15)
BUN: 21 mg/dL (ref 8–23)
CO2: 27 mmol/L (ref 22–32)
Calcium: 9.2 mg/dL (ref 8.9–10.3)
Chloride: 106 mmol/L (ref 98–111)
Creatinine: 1.28 mg/dL — ABNORMAL HIGH (ref 0.44–1.00)
GFR, Estimated: 45 mL/min — ABNORMAL LOW (ref >60)
Glucose, Bld: 115 mg/dL — ABNORMAL HIGH (ref 70–99)
Potassium: 4 mmol/L (ref 3.5–5.1)
Sodium: 139 mmol/L (ref 135–145)
Total Bilirubin: 0.3 mg/dL (ref 0.0–1.2)
Total Protein: 6.2 g/dL — ABNORMAL LOW (ref 6.5–8.1)

## 2023-10-02 LAB — CBC WITH DIFFERENTIAL (CANCER CENTER ONLY)
Abs Immature Granulocytes: 0.03 10*3/uL (ref 0.00–0.07)
Basophils Absolute: 0 10*3/uL (ref 0.0–0.1)
Basophils Relative: 0 %
Eosinophils Absolute: 0 10*3/uL (ref 0.0–0.5)
Eosinophils Relative: 0 %
HCT: 33.6 % — ABNORMAL LOW (ref 36.0–46.0)
Hemoglobin: 10.9 g/dL — ABNORMAL LOW (ref 12.0–15.0)
Immature Granulocytes: 1 %
Lymphocytes Relative: 17 %
Lymphs Abs: 0.9 10*3/uL (ref 0.7–4.0)
MCH: 26.3 pg (ref 26.0–34.0)
MCHC: 32.4 g/dL (ref 30.0–36.0)
MCV: 81 fL (ref 80.0–100.0)
Monocytes Absolute: 0.3 10*3/uL (ref 0.1–1.0)
Monocytes Relative: 6 %
Neutro Abs: 4.2 10*3/uL (ref 1.7–7.7)
Neutrophils Relative %: 76 %
Platelet Count: 291 10*3/uL (ref 150–400)
RBC: 4.15 MIL/uL (ref 3.87–5.11)
RDW: 16.5 % — ABNORMAL HIGH (ref 11.5–15.5)
WBC Count: 5.5 10*3/uL (ref 4.0–10.5)
nRBC: 0 % (ref 0.0–0.2)

## 2023-10-02 MED ORDER — HEPARIN SOD (PORK) LOCK FLUSH 100 UNIT/ML IV SOLN
500.0000 [IU] | Freq: Once | INTRAVENOUS | Status: AC | PRN
  Administered 2023-10-02: 500 [IU]

## 2023-10-02 MED ORDER — OXYCODONE HCL 5 MG PO TABS
5.0000 mg | ORAL_TABLET | ORAL | 0 refills | Status: DC | PRN
  Filled 2023-10-02: qty 60, 5d supply, fill #0

## 2023-10-02 MED ORDER — SODIUM CHLORIDE 0.9% FLUSH
10.0000 mL | INTRAVENOUS | Status: DC | PRN
  Administered 2023-10-02: 10 mL

## 2023-10-02 MED ORDER — SODIUM CHLORIDE 0.9 % IV SOLN
400.0000 mg/m2 | Freq: Once | INTRAVENOUS | Status: AC
  Administered 2023-10-02: 600 mg via INTRAVENOUS
  Filled 2023-10-02: qty 20

## 2023-10-02 MED ORDER — FENTANYL 12 MCG/HR TD PT72
1.0000 | MEDICATED_PATCH | TRANSDERMAL | 0 refills | Status: DC
Start: 2023-10-02 — End: 2023-11-14
  Filled 2023-10-02 – 2023-10-11 (×2): qty 10, 30d supply, fill #0

## 2023-10-02 MED ORDER — CLONIDINE HCL 0.1 MG PO TABS
0.2000 mg | ORAL_TABLET | Freq: Once | ORAL | Status: AC
  Administered 2023-10-02: 0.2 mg via ORAL
  Filled 2023-10-02: qty 2

## 2023-10-02 MED ORDER — PROCHLORPERAZINE MALEATE 10 MG PO TABS
10.0000 mg | ORAL_TABLET | Freq: Once | ORAL | Status: AC
Start: 2023-10-02 — End: 2023-10-02
  Administered 2023-10-02: 10 mg via ORAL
  Filled 2023-10-02: qty 1

## 2023-10-02 MED ORDER — SODIUM CHLORIDE 0.9% FLUSH
10.0000 mL | Freq: Once | INTRAVENOUS | Status: DC
Start: 2023-10-02 — End: 2023-10-02

## 2023-10-02 MED ORDER — SODIUM CHLORIDE 0.9 % IV SOLN: INTRAVENOUS | Status: DC

## 2023-10-02 NOTE — Patient Instructions (Signed)
 [facility] - A DEPT OF [facility]  Discharge Instructions: Thank you for choosing [facility] to provide your oncology and hematology care.   If you have a lab appointment with the Cancer Center, please go directly to the Cancer Center and check in at the registration area.   Wear comfortable clothing and clothing appropriate for easy access to any Portacath or PICC line.   We strive to give you quality time with your provider. You may need to reschedule your appointment if you arrive late (15 or more minutes).  Arriving late affects you and other patients whose appointments are after yours.  Also, if you miss three or more appointments without notifying the office, you may be dismissed from the clinic at the provider's discretion.      For prescription refill requests, have your pharmacy contact our office and allow 72 hours for refills to be completed.    Today you received the following chemotherapy and/or immunotherapy agents: pemetrexed      To help prevent nausea and vomiting after your treatment, we encourage you to take your nausea medication as directed.  BELOW ARE SYMPTOMS THAT SHOULD BE REPORTED IMMEDIATELY: *FEVER GREATER THAN 100.4 F (38 C) OR HIGHER *CHILLS OR SWEATING *NAUSEA AND VOMITING THAT IS NOT CONTROLLED WITH YOUR NAUSEA MEDICATION *UNUSUAL SHORTNESS OF BREATH *UNUSUAL BRUISING OR BLEEDING *URINARY PROBLEMS (pain or burning when urinating, or frequent urination) *BOWEL PROBLEMS (unusual diarrhea, constipation, pain near the anus) TENDERNESS IN MOUTH AND THROAT WITH OR WITHOUT PRESENCE OF ULCERS (sore throat, sores in mouth, or a toothache) UNUSUAL RASH, SWELLING OR PAIN  UNUSUAL VAGINAL DISCHARGE OR ITCHING   Items with * indicate a potential emergency and should be followed up as soon as possible or go to the Emergency Department if any problems should occur.  Please show the CHEMOTHERAPY ALERT CARD or IMMUNOTHERAPY  ALERT CARD at check-in to the Emergency Department and triage nurse.  Should you have questions after your visit or need to cancel or reschedule your appointment, please contact [facility] - A DEPT OF [facility]  Dept: [phone]  and follow the prompts.  Office hours are 8:00 a.m. to 4:30 p.m. Monday - Friday. Please note that voicemails left after 4:00 p.m. may not be returned until the following business day.  We are closed weekends and major holidays. You have access to a nurse at all times for urgent questions. Please call the main number to the clinic Dept: [phone] and follow the prompts.   For any non-urgent questions, you may also contact your provider using MyChart. We now offer e-Visits for anyone 30 and older to request care online for non-urgent symptoms. For details visit [email or web address].   Also download the MyChart app! Go to the app store, search "MyChart", open the app, select [place], and log in with your MyChart username and password.

## 2023-10-02 NOTE — Telephone Encounter (Signed)
 Recommend restarting amlodipine 5 mg daily as well

## 2023-10-02 NOTE — Progress Notes (Signed)
 Palliative Medicine [facility]  Telephone:([phone]) [phone] Fax:(336) [phone]   Name: Allison [name] Date: 10/02/2023 MRN: [id number]  DOB: August 18, 1950  Patient Care Team: [name], MD as PCP - General (Internal Medicine) Little Ishikawa, MD as PCP - Cardiology (Cardiology) Antony Blackbird, MD as Consulting Physician (Radiation Oncology) Pickenpack-Cousar, Arty Baumgartner, NP as Nurse Practitioner (Nurse Practitioner) Syliva Overman, RN as Oncology Nurse Navigator (Oncology) Si Gaul, MD as Consulting Physician (Oncology) Kermit Balo, DO (Geriatric Medicine) Lytle Butte, RN as Oncology Nurse Navigator    INTERVAL HISTORY:stage IV non-small cell lung cancer (12/2017), hypertension, CVA, CAD, DVT/PE (on Lovenox), and GERD.  Palliative ask to see for symptom management and goals of care.  Allison [name] is a 73 y.o. female with   SOCIAL HISTORY:     reports that she has never smoked. She has been exposed to tobacco smoke. She has never used smokeless tobacco. She reports that she does not currently use alcohol. She reports that she does not use drugs.  ADVANCE DIRECTIVES:  MOST and AD on file   CODE STATUS: DNR  PAST MEDICAL HISTORY: Past Medical History:  Diagnosis Date   Anemia    Anxiety    Arthritis    Asthma    exercise induced   Coronary artery disease    Depression    PMH   Dyspnea    GERD (gastroesophageal reflux disease)    Glaucoma    History of radiation therapy 01/05/2021   IMRT right lung  11/24/2020-01/05/2021  Dr Antony Blackbird   Hypertension    lung ca dx'd 11/2017   right   Malignant pleural effusion    right   Nuclear sclerotic cataract of right eye 02/28/2021   Dr. Sallye Lat, cataract surgery February 2023   PONV (postoperative nausea and vomiting)    Pre-diabetes    Raynaud's disease    Raynaud's disease    Stroke (HCC) 01/2021   balance off, some express aphasia, weakness    ALLERGIES:  is  allergic to hydrocodone-acetaminophen, other, penicillins, lactose, vicodin [hydrocodone-acetaminophen], bacid, and ipratropium-albuterol.  MEDICATIONS:  Current Outpatient Medications  Medication Sig Dispense Refill   ALPRAZolam (XANAX) 0.25 MG tablet Take 1 tablet (0.25 mg total) by mouth 2 (two) times daily as needed for anxiety. May take 2 tablets (0.5 mg total) at bedtime. (Patient taking differently: Take 0.25-0.5 mg by mouth 2 (two) times daily as needed for anxiety or sleep.) 60 tablet 0   amLODipine (NORVASC) 5 MG tablet Take 1 tablet (5 mg total) by mouth daily. (Patient not taking: Reported on 09/19/2023) 90 tablet 3   carvedilol (COREG) 6.25 MG tablet Take 1 tablet (6.25 mg total) by mouth 2 (two) times daily. 180 tablet 3   dexamethasone (DECADRON) 4 MG tablet Take 1 tablet twice a day the day before, day of, and day after treatment (Patient not taking: Reported on 09/07/2023) 40 tablet 2   dorzolamide-timolol (COSOPT) 22.3-6.8 MG/ML ophthalmic solution Place 1 drop into both eyes 2 (two) times daily.     doxycycline (VIBRA-TABS) 100 MG tablet Take 1 tablet (100 mg total) by mouth 2 (two) times daily. 14 tablet 0   enoxaparin (LOVENOX) 60 MG/0.6ML injection Inject 0.55 mLs (55 mg total) into the skin every 12 (twelve) hours. 100 mL 1   ezetimibe (ZETIA) 10 MG tablet Take 10 mg by mouth daily.     FeFum-FePoly-FA-B Cmp-C-Biot (FOLIVANE-PLUS) CAPS Take 1 capsule by mouth in the morning.  90 capsule 0   fentaNYL (DURAGESIC) 12 MCG/HR Place 1 patch onto the skin every 3 (three) days. 10 patch 0   FLUoxetine (PROZAC) 20 MG capsule Take 1 capsule (20 mg total) by mouth daily. 90 capsule 3   folic acid (FOLVITE) 1 MG tablet TAKE 1 TABLET BY MOUTH EVERY DAY 90 tablet 1   gabapentin (NEURONTIN) 250 MG/5ML solution Take 4 mLs (200 mg total) by mouth at bedtime. 470 mL 1   Glycopyrrolate-Formoterol (BEVESPI AEROSPHERE) 9-4.8 MCG/ACT AERO Inhale 2 puffs into the lungs 2 (two) times daily. (Patient not  taking: Reported on 09/19/2023) 10.7 g 3   HYDROcodone bit-homatropine (HYCODAN) 5-1.5 MG/5ML syrup Take 5 mLs by mouth every 6 (six) hours as needed for cough. (Patient not taking: Reported on 09/26/2023) 240 mL 0   hyoscyamine (LEVSIN SL) 0.125 MG SL tablet Place 1-2 tablets (0.125-0.25mg ) under the tongue every 6 (six) hours as needed for cramping. 30 tablet 3   isosorbide mononitrate (IMDUR) 30 MG 24 hr tablet Take 1 tablet (30 mg total) by mouth daily. 90 tablet 3   latanoprost (XALATAN) 0.005 % ophthalmic solution Place 1 drop into both eyes at bedtime.      ondansetron (ZOFRAN-ODT) 4 MG disintegrating tablet Take 1 tablet (4 mg total) by mouth every 8 (eight) hours as needed for nausea or vomiting. (Patient taking differently: Take 4 mg by mouth every 8 (eight) hours as needed for nausea or vomiting (dissolve orally).) 30 tablet 1   osimertinib mesylate (TAGRISSO) 80 MG tablet Take 1 tablet (80 mg total) by mouth daily. 30 tablet 4   oxyCODONE (OXY IR/ROXICODONE) 5 MG immediate release tablet Take 1-2 tablets (5-10 mg total) by mouth every 4 (four) hours as needed for severe pain (pain score 7-10) or breakthrough pain. 60 tablet 0   PEPCID AC 10 MG tablet Take 10 mg by mouth 2 (two) times daily.     predniSONE (DELTASONE) 10 MG tablet Take 1 tablet (10 mg total) by mouth daily with breakfast. 30 tablet 2   prochlorperazine (COMPAZINE) 10 MG tablet Take 1 tablet (10 mg total) by mouth every 6 (six) hours as needed. (Patient taking differently: Take 10 mg by mouth every 6 (six) hours as needed for nausea or vomiting.) 30 tablet 2   REPATHA SURECLICK 140 MG/ML SOAJ Inject 140 mg into the skin every 14 (fourteen) days. 6 mL 3   senna-docusate (SENNA S) 8.6-50 MG tablet Take 2 tablets by mouth at bedtime. 60 tablet 3   Wheat Dextrin (BENEFIBER DRINK MIX) PACK Take 1 packet by mouth daily.     No current facility-administered medications for this visit.   Facility-Administered Medications Ordered in  Other Visits  Medication Dose Route Frequency Provider Last Rate Last Admin   0.9 %  sodium chloride infusion   Intravenous Continuous Si Gaul, MD   Stopped at 10/02/23 1016   sodium chloride flush (NS) 0.9 % injection 10 mL  10 mL Intracatheter PRN Si Gaul, MD   10 mL at 10/02/23 1018    VITAL SIGNS: LMP  (LMP Unknown)  There were no vitals filed for this visit.  Estimated body mass index is 20.94 kg/m as calculated from the following:   Height as of an earlier encounter on 10/02/23: 5\' 4"  (1.626 m).   Weight as of an earlier encounter on 10/02/23: 122 lb (55.3 kg).   PERFORMANCE STATUS (ECOG) : 2 - Symptomatic, <50% confined to bed   Physical Exam General: Fatigue Cardiovascular: regular  rate and rhythm Pulmonary: diminished, dyspnea with minimal exertion, cough Extremities: no edema, no joint deformities Skin: no rashes Neurological: AAO x3  IMPRESSION: Discussed the use of AI scribe software for clinical note transcription with the patient, who gave verbal consent to proceed.  History of Present Illness Allison [name] is a 73 year old female who was seen during infusion for symptom management follow-up. Tolerating without difficulty. She is accompanied by her daughter. Feels much better than prior weeks. Denies concerns with  nausea, vomiting, or diarrhea. Constipation is controlled with daily Senna. Reports having to skip some days. She reports decreased appetite and occasional difficulty with eating, leading to some weight loss. She is trying to increase her fluid intake. Current weight 122lbs down from 127lbs on 4/2.   She has been experiencing consistently high blood pressure, with recent readings of 193/92 mmHg, despite medication adjustments. Her blood pressure was better three weeks ago but has since increased. Her current medication regimen includes carvedilol 6.25 mg once daily, recently adjusted by her Cardiologist, and prednisone 10 mg. She is waiting  for a follow-up to discuss consistently high readings. Patient received clonidine during infusion today per Oncology.   She receives home health care, including physical and occupational therapy, to assist with daily activities. Her activity level remains low, but is slowly improving, she can shower independently which was a previous challenge..  We discussed her pain at length. Ms. Rhett shares her appreciation of how well her pain is now controlled. Mentions not wearing patch for a brief period and increase in discomfort alerting her that it was indeed managing her pain. Her current regimen consiste of fentanyl patch and oxycodone 5-10mg  as needed. No adjustments to current regimen.   Ms. Linebaugh and family continue to remain hopeful for stability with treatment.  Her goal is to continue to treat the treatable allow her every opportunity to continue to thrive while aggressively managing her symptoms.  Her quality of life is most important to her and her family.  I discussed the importance of continued conversation with family and their medical providers regarding overall plan of care and treatment options, ensuring decisions are within the context of the patients values and GOCs. Assessment & Plan Cancer related pain Fentanyl patch and oxycodone used for pain control. Pain well controlled. No adjustments.  - Continue oxycodone 5-10 mg every 4-6 hours as needed. - Fentanyl 12.5 mcg patch for long-acting pain relief, change every three days. -Continue Xanax as needed for anxiety. -Will continue to closely monitor and adjust regimen as needed  Constipation Much improved with use of Senna. Last BM on 4/14.  -Continue Senna-S 2 tablets at bedtime.   Hypertension Blood pressure elevated at 193/92 mmHg. Asymptomatic. Elimite may contribute to hypertension. - Continue current antihypertensive medications per Cardiology. - Monitor blood pressure closely. - Collaborate with cardiologist to  adjust medications as needed. - Consider aliments that have an impact on blood pressure and adjust treatment.  Follow-up I will plan to see patient back in 4-6 weeks. Sooner if needed.   Patient expressed understanding and was in agreement with this plan. She also understands that She can call the clinic at any time with any questions, concerns, or complaints.   Any controlled substances utilized were prescribed in the context of palliative care. PDMP has been reviewed.   Visit consisted of counseling and education dealing with the complex and emotionally intense issues of symptom management and palliative care in the setting of serious and potentially life-threatening illness.  Dellia Ferguson, AGPCNP-BC  Palliative Medicine Team/[facility]

## 2023-10-03 ENCOUNTER — Telehealth: Payer: Self-pay

## 2023-10-03 LAB — LIPID PANEL
Chol/HDL Ratio: 2.5 ratio (ref 0.0–4.4)
Cholesterol, Total: 149 mg/dL (ref 100–199)
HDL: 60 mg/dL (ref >39)
LDL Chol Calc (NIH): 77 mg/dL (ref 0–99)
Triglycerides: 60 mg/dL (ref 0–149)
VLDL Cholesterol Cal: 12 mg/dL (ref 5–40)

## 2023-10-03 NOTE — Telephone Encounter (Signed)
 Spoke with Cary Clarks from [place] [facility] from Dr. Verne Golden office in regards to patients new Lovenox prescription that was reduced to 0.55mg  twice daily.  Sent Lovenox prescription to Dr. Verne Golden office with fax confirmation at [phone]. Confirmed with Cary Clarks that Dr. Hershal Loron is going to be refilling Lovenox.

## 2023-10-04 ENCOUNTER — Other Ambulatory Visit (HOSPITAL_COMMUNITY): Payer: Self-pay

## 2023-10-11 ENCOUNTER — Other Ambulatory Visit (HOSPITAL_COMMUNITY): Payer: Self-pay

## 2023-10-15 ENCOUNTER — Encounter: Payer: Self-pay | Admitting: General Practice

## 2023-10-15 NOTE — Progress Notes (Signed)
 [facility] Spiritual Care Note  Follow Markida through [facility]. Left voicemail of support and encouragement, and plan to follow up when she is on campus for treatment.   [address] Dorice Gardner, [place], [facility] Pager [phone] Voicemail [phone]

## 2023-10-16 ENCOUNTER — Other Ambulatory Visit (HOSPITAL_COMMUNITY): Payer: Self-pay

## 2023-10-16 MED ORDER — ALPRAZOLAM 0.25 MG PO TABS
0.2500 mg | ORAL_TABLET | Freq: Three times a day (TID) | ORAL | 3 refills | Status: DC | PRN
  Filled 2023-10-16: qty 90, 30d supply, fill #0

## 2023-10-18 ENCOUNTER — Ambulatory Visit

## 2023-10-18 ENCOUNTER — Other Ambulatory Visit

## 2023-10-18 ENCOUNTER — Ambulatory Visit: Admitting: Internal Medicine

## 2023-10-19 NOTE — Progress Notes (Unsigned)
 [facility] OFFICE PROGRESS NOTE  Allison Hence, MD [address] [place] [address]  DIAGNOSIS: Stage IV (T2 a,N2, M1a) non-small cell lung cancer, adenocarcinoma diagnosed in July 2019 and presented with right upper lobe lung mass in addition to mediastinal lymphadenopathy as well as bilateral pulmonary nodules and malignant right pleural effusion.   Biomarker Findings Microsatellite status - Cannot Be Determined Tumor Mutational Burden - Cannot Be Determined Genomic Findings For a complete list of the genes assayed, please refer to the Appendix. EGFR exon 19 deletion ([id number]) TP53 Y220C 7 Disease relevant genes with no reportable alterations: KRAS, ALK, BRAF, MET, RET, ERBB2, ROS1    Repeat molecular studies in 2025 showed no new mutations  PRIOR THERAPY: 1) Status post right Pleurx catheter placement by Dr. Matt Song for drainage of malignant right pleural effusion. 2) palliative radiotherapy to the enlarging right upper lobe lung mass and mediastinum under the care of Dr. Eloise Hake expected to be completed on January 05, 2021. 3) Systemic chemotherapy with carboplatin  for AUC of 5 and Alimta  500 Mg/M2 every 3 weeks.  First dose 03/17/2021.  The patient will also continue her current treatment with Tagrisso  80 mg p.o. daily.  She is status post 26 cycles. Starting from cycle #7, she will be on Alimta  only 400 mg/m2.  Her treatment with Alimta  has been on hold for several months now.   CURRENT THERAPY: Tagrisso  80 mg p.o. daily.  First dose was given on 01/29/2018.  Status post 68.5 months of treatment.  IV chemotherapy with alimta  400 mg/m2 IV every 3 weeks to start around 09/11/23. Status post 2 cycles.   INTERVAL HISTORY: Allison [name] 73 y.o. female returns to the clinic today for a follow-up visit accompanied by her daughter and significant other.  In summary, the patient was recently found to have disease progression.  Therefore, she restarted  systemic chemotherapy with single agent Alimta  dose reduced to 400 mg/m for tolerability.  She is status post 2 cycles of treatment and tolerated it well overall.   In summary, she has been experiencing a persistent cough and progressive weakness since January 2025.  The cough persist despite the use of hydrocodone  cough syrup.  She is also on low-dose prednisone  for her cough and appetite.   She is doing a lot better today and she previously had difficulties talking without coughing. She was able to converse during the encounter without hardly coughing and she also walked into the clinic without the use of the wheelchair. She feels better with reduced need for a wheelchair and improved energy levels, although she still experiences fatigue and needs to rest frequently but overall she is more active than before. Her hemoglobin is 10.7, slightly lower than before but not significantly concerning.  She manages her cough with Bevespi  and occasionally uses hycodan when needed. She experiences shortness of breath with exertion and has to pace herself during activities which is stable. Her chest felt tight during occupational therapy yesterday, but her oxygen levels remained stable. She enjoys spending time outside in the garden but finds it tiring. She alternates weeks of PT and OT  She is experiencing lumps under her skin at the Lovenox  injection sites, which are described as having some bruising and possibly being scar tissue or small bleeds under the skin. She continues to administer Lovenox  twice daily and is concerned about the possibility of her blood being too thin, as she has noticed some bleeding at the injection sites.  She is here today for evaluation repeat blood work before undergoing cycle #3   MEDICAL HISTORY: Past Medical History:  Diagnosis Date   Anemia    Anxiety    Arthritis    Asthma    exercise induced   Coronary artery disease    Depression    PMH   Dyspnea    GERD  (gastroesophageal reflux disease)    Glaucoma    History of radiation therapy 01/05/2021   IMRT right lung  11/24/2020-01/05/2021  Dr Retta Caster   Hypertension    lung ca dx'd 11/2017   right   Malignant pleural effusion    right   Nuclear sclerotic cataract of right eye 02/28/2021   Dr. Devin Foerster, cataract surgery February 2023   PONV (postoperative nausea and vomiting)    Pre-diabetes    Raynaud's disease    Raynaud's disease    Stroke (HCC) 01/2021   balance off, some express aphasia, weakness    ALLERGIES:  is allergic to hydrocodone -acetaminophen , other, penicillins, lactose, vicodin [hydrocodone -acetaminophen ], bacid, and ipratropium-albuterol .  MEDICATIONS:  Current Outpatient Medications  Medication Sig Dispense Refill   ALPRAZolam  (XANAX ) 0.25 MG tablet Take 1 tablet (0.25 mg total) by mouth 2 (two) times daily as needed for anxiety. May take 2 tablets (0.5 mg total) at bedtime. (Patient taking differently: Take 0.25-0.5 mg by mouth 2 (two) times daily as needed for anxiety or sleep.) 60 tablet 0   ALPRAZolam  (XANAX ) 0.25 MG tablet Take 1 tablet (0.25 mg total) by mouth 3 (three) times daily as needed. 90 tablet 3   amLODipine  (NORVASC ) 5 MG tablet Take 1 tablet (5 mg total) by mouth daily. (Patient not taking: Reported on 09/19/2023) 90 tablet 3   carvedilol  (COREG ) 6.25 MG tablet Take 1 tablet (6.25 mg total) by mouth 2 (two) times daily. 180 tablet 3   dexamethasone  (DECADRON ) 4 MG tablet Take 1 tablet twice a day the day before, day of, and day after treatment (Patient not taking: Reported on 09/07/2023) 40 tablet 2   dorzolamide -timolol  (COSOPT ) 22.3-6.8 MG/ML ophthalmic solution Place 1 drop into both eyes 2 (two) times daily.     doxycycline  (VIBRA -TABS) 100 MG tablet Take 1 tablet (100 mg total) by mouth 2 (two) times daily. 14 tablet 0   enoxaparin  (LOVENOX ) 60 MG/0.6ML injection Inject 0.55 mLs (55 mg total) into the skin every 12 (twelve) hours. 100 mL 1    ezetimibe  (ZETIA ) 10 MG tablet Take 10 mg by mouth daily.     FeFum-FePoly-FA-B Cmp-C-Biot (FOLIVANE-PLUS) CAPS Take 1 capsule by mouth in the morning. 90 capsule 0   fentaNYL  (DURAGESIC ) 12 MCG/HR Place 1 patch onto the skin every 3 (three) days. 10 patch 0   FLUoxetine  (PROZAC ) 20 MG capsule Take 1 capsule (20 mg total) by mouth daily. 90 capsule 3   folic acid  (FOLVITE ) 1 MG tablet TAKE 1 TABLET BY MOUTH EVERY DAY 90 tablet 1   gabapentin  (NEURONTIN ) 250 MG/5ML solution Take 4 mLs (200 mg total) by mouth at bedtime. 470 mL 1   Glycopyrrolate -Formoterol  (BEVESPI  AEROSPHERE) 9-4.8 MCG/ACT AERO Inhale 2 puffs into the lungs 2 (two) times daily. (Patient not taking: Reported on 09/19/2023) 10.7 g 3   HYDROcodone  bit-homatropine (HYCODAN) 5-1.5 MG/5ML syrup Take 5 mLs by mouth every 6 (six) hours as needed for cough. (Patient not taking: Reported on 09/26/2023) 240 mL 0   hyoscyamine  (LEVSIN SL) 0.125 MG SL tablet Place 1-2 tablets (0.125-0.25mg ) under the tongue every 6 (six) hours as  needed for cramping. 30 tablet 3   isosorbide  mononitrate (IMDUR ) 30 MG 24 hr tablet Take 1 tablet (30 mg total) by mouth daily. 90 tablet 3   latanoprost  (XALATAN ) 0.005 % ophthalmic solution Place 1 drop into both eyes at bedtime.      ondansetron  (ZOFRAN -ODT) 4 MG disintegrating tablet Take 1 tablet (4 mg total) by mouth every 8 (eight) hours as needed for nausea or vomiting. (Patient taking differently: Take 4 mg by mouth every 8 (eight) hours as needed for nausea or vomiting (dissolve orally).) 30 tablet 1   osimertinib  mesylate (TAGRISSO ) 80 MG tablet Take 1 tablet (80 mg total) by mouth daily. 30 tablet 4   oxyCODONE  (OXY IR/ROXICODONE ) 5 MG immediate release tablet Take 1-2 tablets (5-10 mg total) by mouth every 4 (four) hours as needed for severe pain (pain score 7-10) or breakthrough pain. 60 tablet 0   PEPCID  AC 10 MG tablet Take 10 mg by mouth 2 (two) times daily.     predniSONE  (DELTASONE ) 10 MG tablet Take 1  tablet (10 mg total) by mouth daily with breakfast. 30 tablet 2   prochlorperazine  (COMPAZINE ) 10 MG tablet Take 1 tablet (10 mg total) by mouth every 6 (six) hours as needed. (Patient taking differently: Take 10 mg by mouth every 6 (six) hours as needed for nausea or vomiting.) 30 tablet 2   REPATHA  SURECLICK 140 MG/ML SOAJ Inject 140 mg into the skin every 14 (fourteen) days. 6 mL 3   senna-docusate (SENNA S) 8.6-50 MG tablet Take 2 tablets by mouth at bedtime. 60 tablet 3   Wheat Dextrin (BENEFIBER DRINK MIX) PACK Take 1 packet by mouth daily.     No current facility-administered medications for this visit.    SURGICAL HISTORY:  Past Surgical History:  Procedure Laterality Date   ABDOMINAL HYSTERECTOMY     partial   BRONCHIAL BIOPSY  04/21/2021   Procedure: BRONCHIAL BIOPSIES;  Surgeon: Prudy Brownie, DO;  Location: [facility];  Service: Pulmonary;;   BRONCHIAL BRUSHINGS  04/21/2021   Procedure: BRONCHIAL BRUSHINGS;  Surgeon: Prudy Brownie, DO;  Location: [facility];  Service: Pulmonary;;   BRONCHIAL NEEDLE ASPIRATION BIOPSY  04/21/2021   Procedure: BRONCHIAL NEEDLE ASPIRATION BIOPSIES;  Surgeon: Prudy Brownie, DO;  Location: [facility];  Service: Pulmonary;;   CHEST TUBE INSERTION Right 01/01/2018   Procedure: INSERTION PLEURAL DRAINAGE CATHETER;  Surgeon: Heriberto London, MD;  Location: [facility] OR;  Service: Thoracic;  Laterality: Right;   CHEST TUBE INSERTION  04/21/2021   Procedure: CHEST TUBE INSERTION;  Surgeon: Prudy Brownie, DO;  Location: [facility];  Service: Pulmonary;;   COLONOSCOPY     CORONARY STENT INTERVENTION N/A 06/16/2019   Procedure: CORONARY STENT INTERVENTION;  Surgeon: Lucendia Rusk, MD;  Location: [facility];  Service: Cardiovascular;  Laterality: N/A;   DILATION AND CURETTAGE OF UTERUS     EYE SURGERY     due to Glaucoma   IR IMAGING GUIDED PORT INSERTION  03/22/2021   IR PORT REPAIR CENTRAL VENOUS ACCESS DEVICE  04/15/2021   LEFT  HEART CATH AND CORONARY ANGIOGRAPHY N/A 06/16/2019   Procedure: LEFT HEART CATH AND CORONARY ANGIOGRAPHY;  Surgeon: Lucendia Rusk, MD;  Location: [facility] INVASIVE CV LAB;  Service: Cardiovascular;  Laterality: N/A;   REMOVAL OF PLEURAL DRAINAGE CATHETER Right 11/07/2018   Procedure: REMOVAL OF PLEURAL DRAINAGE CATHETER;  Surgeon: Heriberto London, MD;  Location: [facility] [place] OR;  Service: Thoracic;  Laterality: Right;  ROTATOR CUFF REPAIR     TUBAL LIGATION     VIDEO BRONCHOSCOPY WITH ENDOBRONCHIAL NAVIGATION Bilateral 04/21/2021   Procedure: VIDEO BRONCHOSCOPY WITH ENDOBRONCHIAL NAVIGATION;  Surgeon: Prudy Brownie, DO;  Location: [facility];  Service: Pulmonary;  Laterality: Bilateral;  ION   WISDOM TOOTH EXTRACTION      REVIEW OF SYSTEMS:   Review of Systems  Constitutional: Positive for fatigue (improved compared to prior). Improving appetite. Negative for chills and fever.  HENT:  Negative for mouth sores, nosebleeds, sore throat and trouble swallowing.   Eyes: Negative for eye problems and icterus.  Respiratory: Positive for improving cough and persistent shortness of breath with exertion. Negative for hemoptysis and wheezing.   Cardiovascular: Positive for improving right rib discomfort.Negative for leg swelling.  Gastrointestinal: Positive for intermittent constipation and intermittent diarrhea. Occasional mild nausea. Positive for subcutaneous knots near lovenox  injection sites. Negative for abdominal pain  and vomiting.  Genitourinary: Negative for bladder incontinence, difficulty urinating, dysuria, frequency and hematuria.   Musculoskeletal: Negative for gait problem, neck pain and neck stiffness.  Skin: Negative for itching and rash.  Neurological: Negative for dizziness, extremity weakness, gait problem, headaches, light-headedness and seizures.  Hematological: Negative for adenopathy. Does not bruise/bleed easily.  Psychiatric/Behavioral: Negative for confusion, depression and sleep  disturbance. The patient is not nervous/anxious.     PHYSICAL EXAMINATION:  There were no vitals taken for this visit.  ECOG PERFORMANCE STATUS: 1-2  Physical Exam  Constitutional: Oriented to person, place, and time and thin appearing female and in no distress.   HENT:  Head: Normocephalic and atraumatic.  Mouth/Throat: Oropharynx is clear and moist. No oropharyngeal exudate.  Eyes: Conjunctivae are normal. Right eye exhibits no discharge. Left eye exhibits no discharge. No scleral icterus.  Neck: Normal range of motion. Neck supple.  Cardiovascular: Normal rate, regular rhythm, normal heart sounds and intact distal pulses.   Pulmonary/Chest: Effort normal and breath sounds clear to auscultation. No respiratory distress. No wheezes. No rales.  Abdominal: Soft. Bowel sounds are normal. Exhibits no distension and no mass. There is no tenderness.  Musculoskeletal: Normal range of motion. Exhibits no edema.  Lymphadenopathy:    No cervical adenopathy.  Neurological: Alert and oriented to person, place, and time. Exhibits muscle wasting. Gait normal. Coordination normal. Uses a cane for ambulation.  Skin: Skin is warm and dry. No rash noted. Not diaphoretic. No erythema. No pallor.  Psychiatric: Mood, memory and judgment normal.  Vitals reviewed.  LABORATORY DATA: Lab Results  Component Value Date   WBC 5.5 10/02/2023   HGB 10.9 (L) 10/02/2023   HCT 33.6 (L) 10/02/2023   MCV 81.0 10/02/2023   PLT 291 10/02/2023      Chemistry      Component Value Date/Time   NA 139 10/02/2023 0746   NA 140 03/04/2021 1516   K 4.0 10/02/2023 0746   CL 106 10/02/2023 0746   CO2 27 10/02/2023 0746   BUN 21 10/02/2023 0746   BUN 21 03/04/2021 1516   CREATININE 1.28 (H) 10/02/2023 0746      Component Value Date/Time   CALCIUM  9.2 10/02/2023 0746   ALKPHOS 64 10/02/2023 0746   AST 17 10/02/2023 0746   ALT 22 10/02/2023 0746   BILITOT 0.3 10/02/2023 0746       RADIOGRAPHIC  STUDIES:  No results found.   ASSESSMENT/PLAN:  This is a very pleasant 73 year old never smoker African-American female diagnosed with stage IV non-small cell lung cancer, adenocarcinoma.  She was positive  for an EGFR mutation with deletion in exon 19.  She was diagnosed in July 2019 and presented with right upper lobe lung mass in addition to mediastinal lymphadenopathy as well as bilateral pulmonary nodules and malignant right pleural effusion.   The patient was started on treatment with Tagrisso  80 mg p.o. daily status post 39 months of treatment. Started on 01/29/2018.   In April 2022, she showed evidence of disease progression with interval progression of the right apical lung mass in addition to progression of mediastinal lymphadenopathy concerning for worsening of her disease. She had repeat Guardant 360 molecular studies which did not show evidence of new resistant mutations.  Therefore, she was referred to radiation oncology and completed palliative radiotherapy to the enlarging right upper lobe lung mass and mediastinum under the care of Dr. Eloise Hake.  This was completed in July 2022.    The patient underwent a repeat bronchoscopy and biopsy for repeat molecular testing.  The sample from the left and right lung biopsy was negative for malignancy. This was performed in November 2022.    Unfortunately, the patient  was found to have evidence of disease progression.  Therefore, Dr. Marguerita Shih started the patient on systemic chemotherapy with carboplatin  for AUC of 5 and Alimta  500 mg per metered squared.  She is status post 23 cycles and she tolerated it fairly well despite her ongoing issues with fatigue, chest tightness, cough, and weight loss.  Alimta  was reduced to 400 mg per metered squared due to renal insufficiency.  She is not a good candidate for Avastin due to her recent CVA in August 2022.  She also is continuing to take Tagrisso  as it is protective against progressive metastatic disease  to the brain. Starting from cycle #7,  she started maintenance single agent alimta . Her treatment has been on hold since March 2024 due to side effects of treatment.    Of note, the patient saw Dr. Baker Bon from [facility] for second opinion in November 2022.  They discussed if she progresses on chemotherapy that there may be upcoming options. He discussed other treatment options which may be available in 2023 including patritumab deruxtcan and lazertinib/amivantmab.    Her most recent restaging CT scan showed disease progression with new and increasing septal thickening and perilymphatic nodularity throughout the right hemithorax suspicious for lymphangitic spread of tumor.  A PET scan to confirm this.   Therefore, at her appointment on 08/23/2023 we discussed the options.  She had repeat molecular studies performed which did not show any new actionable mutations.  Therefore the plan will be to proceed with single agent Alimta  IV every 3 weeks as scheduled.  The alternative options that we discussed was adding carboplatin  to the Alimta .  We also talked about amivantamab and lazertinib, but the patient and Dr. Marguerita Shih have concerns about quality of life and side effect profile, although this likely would be effective     The patient opted to proceed on single agent chemotherapy with Alimta  400 mg/m.  She started this on 09/11/2023.  She tolerated this fairly well. She is status post 2 weeks of treatment.    She looks much improved since she started treatment. Labs were reviewed.  Recommend that she proceed with cycle 3 today scheduled as long as her CMP allows.   I will arrange for a restaging CT scan of the CAP prior to her next cycle of treatment. I will order WO contrast due to her fluctuating kidney function. I don't want to  worsen her creatinine prior to her next chemotherapy which may cause dose delays if she has elevated creatinine at her next appointment.    We will see her back for  follow-up visit in 3 weeks for evaluation review blood work before undergoing cycle #4.   Her creatinine is 1.15 today.   She will continue taking prednisone  10 mg daily.   She will continue taking Hycodan for cough, she is not needing this as frequently.  She will continue her folic acid .  She will continue taking her Tagrisso  for neuroprotection.  She will continue taking a PPI for heartburn.   She will continue to follow with palliative care for pain management. I have messaged them for refill of her oxycodone    The patient will continue being compliant with her Lovenox  due to her upper extremity DVTs. Due to qusetions with taking this twice a day, I will check INR. Per protocol, they are unable to draw this off of her port-a-cath. Therefore, just for her next visit I will ask for a peripheral lab draw so this can be checked.    After her next restaging scan, we may refer her back to pulmonary medicine to consider repeat biopsy.     She will continue taking Lovenox .    Anemia Hemoglobin at 10.7, likely secondary to chemotherapy. Discussed impact on hematologic parameters and need for monitoring. - Monitor hemoglobin levels regularly.  Pain management Pain improved with daily oxycodone  and effective fentanyl  patch. Pain management crucial for quality of life. - Continue oxycodone  as needed. - Continue fentanyl  patch every three days. - Refill oxycodone  prescription.  Shortness of breath Exertional dyspnea persists but manageable. Increased activity tolerance noted. - Encourage pacing activities to manage symptoms.  Chronic cough Cough controlled with Bevespi  and occasional hycodan. No recent exacerbations. -She is coughing significantly less today than at her last several visits.  - Continue Bevespi  for cough management. - Use hydrocodone  as needed for cough.  Constipation Constipation well-managed with senna. Regular bowel movements reported. - Continue senna for constipation  management.  Anxiety Anxiety managed with nightly Xanax .  Hypertension Blood pressure elevated in clinic but well-controlled at home. - Continue home blood pressure monitoring.  Diarrhea Intermittent diarrhea possibly related to Tagrisso , managed with Lomotil. Discussed potential link with Tagrisso  and impact of constipation medications. - Use Lomotil as needed for diarrhea.   The patient was advised to call immediately if she has any concerning symptoms in the interval. The patient voices understanding of current disease status and treatment options and is in agreement with the current care plan. All questions were answered. The patient knows to call the clinic with any problems, questions or concerns. We can certainly see the patient much sooner if necessary   No orders of the defined types were placed in this encounter.    The total time spent in the appointment was 30-39 minutes  Kyri Shader L Christiano Blandon, PA-C 10/19/23

## 2023-10-23 ENCOUNTER — Inpatient Hospital Stay

## 2023-10-23 ENCOUNTER — Inpatient Hospital Stay: Admitting: Physician Assistant

## 2023-10-23 ENCOUNTER — Other Ambulatory Visit: Payer: Self-pay

## 2023-10-23 ENCOUNTER — Inpatient Hospital Stay: Attending: Internal Medicine

## 2023-10-23 ENCOUNTER — Other Ambulatory Visit (HOSPITAL_COMMUNITY): Payer: Self-pay

## 2023-10-23 VITALS — BP 158/84 | HR 84 | Temp 98.3°F | Resp 15 | Wt 123.5 lb

## 2023-10-23 DIAGNOSIS — I82403 Acute embolism and thrombosis of unspecified deep veins of lower extremity, bilateral: Secondary | ICD-10-CM | POA: Diagnosis not present

## 2023-10-23 DIAGNOSIS — Z9221 Personal history of antineoplastic chemotherapy: Secondary | ICD-10-CM | POA: Insufficient documentation

## 2023-10-23 DIAGNOSIS — N289 Disorder of kidney and ureter, unspecified: Secondary | ICD-10-CM | POA: Insufficient documentation

## 2023-10-23 DIAGNOSIS — C3491 Malignant neoplasm of unspecified part of right bronchus or lung: Secondary | ICD-10-CM

## 2023-10-23 DIAGNOSIS — G893 Neoplasm related pain (acute) (chronic): Secondary | ICD-10-CM | POA: Diagnosis not present

## 2023-10-23 DIAGNOSIS — J910 Malignant pleural effusion: Secondary | ICD-10-CM | POA: Insufficient documentation

## 2023-10-23 DIAGNOSIS — Z7952 Long term (current) use of systemic steroids: Secondary | ICD-10-CM | POA: Insufficient documentation

## 2023-10-23 DIAGNOSIS — F419 Anxiety disorder, unspecified: Secondary | ICD-10-CM | POA: Insufficient documentation

## 2023-10-23 DIAGNOSIS — C7931 Secondary malignant neoplasm of brain: Secondary | ICD-10-CM | POA: Diagnosis not present

## 2023-10-23 DIAGNOSIS — K5903 Drug induced constipation: Secondary | ICD-10-CM

## 2023-10-23 DIAGNOSIS — R0602 Shortness of breath: Secondary | ICD-10-CM | POA: Diagnosis not present

## 2023-10-23 DIAGNOSIS — Z86718 Personal history of other venous thrombosis and embolism: Secondary | ICD-10-CM | POA: Insufficient documentation

## 2023-10-23 DIAGNOSIS — C3411 Malignant neoplasm of upper lobe, right bronchus or lung: Secondary | ICD-10-CM | POA: Diagnosis present

## 2023-10-23 DIAGNOSIS — T451X5A Adverse effect of antineoplastic and immunosuppressive drugs, initial encounter: Secondary | ICD-10-CM | POA: Diagnosis not present

## 2023-10-23 DIAGNOSIS — R6884 Jaw pain: Secondary | ICD-10-CM | POA: Diagnosis not present

## 2023-10-23 DIAGNOSIS — Z95828 Presence of other vascular implants and grafts: Secondary | ICD-10-CM

## 2023-10-23 DIAGNOSIS — D6481 Anemia due to antineoplastic chemotherapy: Secondary | ICD-10-CM | POA: Insufficient documentation

## 2023-10-23 DIAGNOSIS — Z8673 Personal history of transient ischemic attack (TIA), and cerebral infarction without residual deficits: Secondary | ICD-10-CM | POA: Insufficient documentation

## 2023-10-23 DIAGNOSIS — R61 Generalized hyperhidrosis: Secondary | ICD-10-CM | POA: Insufficient documentation

## 2023-10-23 DIAGNOSIS — Z79899 Other long term (current) drug therapy: Secondary | ICD-10-CM | POA: Insufficient documentation

## 2023-10-23 DIAGNOSIS — Z5111 Encounter for antineoplastic chemotherapy: Secondary | ICD-10-CM

## 2023-10-23 DIAGNOSIS — C3490 Malignant neoplasm of unspecified part of unspecified bronchus or lung: Secondary | ICD-10-CM

## 2023-10-23 DIAGNOSIS — Z515 Encounter for palliative care: Secondary | ICD-10-CM

## 2023-10-23 DIAGNOSIS — T50995D Adverse effect of other drugs, medicaments and biological substances, subsequent encounter: Secondary | ICD-10-CM | POA: Insufficient documentation

## 2023-10-23 LAB — CMP (CANCER CENTER ONLY)
ALT: 32 U/L (ref 0–44)
AST: 19 U/L (ref 15–41)
Albumin: 3.6 g/dL (ref 3.5–5.0)
Alkaline Phosphatase: 50 U/L (ref 38–126)
Anion gap: 5 (ref 5–15)
BUN: 19 mg/dL (ref 8–23)
CO2: 26 mmol/L (ref 22–32)
Calcium: 9 mg/dL (ref 8.9–10.3)
Chloride: 107 mmol/L (ref 98–111)
Creatinine: 1.15 mg/dL — ABNORMAL HIGH (ref 0.44–1.00)
GFR, Estimated: 51 mL/min — ABNORMAL LOW (ref >60)
Glucose, Bld: 104 mg/dL — ABNORMAL HIGH (ref 70–99)
Potassium: 3.6 mmol/L (ref 3.5–5.1)
Sodium: 138 mmol/L (ref 135–145)
Total Bilirubin: 0.2 mg/dL (ref 0.0–1.2)
Total Protein: 6.4 g/dL — ABNORMAL LOW (ref 6.5–8.1)

## 2023-10-23 LAB — CBC WITH DIFFERENTIAL (CANCER CENTER ONLY)
Abs Immature Granulocytes: 0.06 10*3/uL (ref 0.00–0.07)
Basophils Absolute: 0 10*3/uL (ref 0.0–0.1)
Basophils Relative: 0 %
Eosinophils Absolute: 0 10*3/uL (ref 0.0–0.5)
Eosinophils Relative: 0 %
HCT: 33.6 % — ABNORMAL LOW (ref 36.0–46.0)
Hemoglobin: 10.7 g/dL — ABNORMAL LOW (ref 12.0–15.0)
Immature Granulocytes: 1 %
Lymphocytes Relative: 11 %
Lymphs Abs: 1 10*3/uL (ref 0.7–4.0)
MCH: 26.3 pg (ref 26.0–34.0)
MCHC: 31.8 g/dL (ref 30.0–36.0)
MCV: 82.6 fL (ref 80.0–100.0)
Monocytes Absolute: 0.9 10*3/uL (ref 0.1–1.0)
Monocytes Relative: 9 %
Neutro Abs: 7.8 10*3/uL — ABNORMAL HIGH (ref 1.7–7.7)
Neutrophils Relative %: 79 %
Platelet Count: 256 10*3/uL (ref 150–400)
RBC: 4.07 MIL/uL (ref 3.87–5.11)
RDW: 19 % — ABNORMAL HIGH (ref 11.5–15.5)
WBC Count: 9.8 10*3/uL (ref 4.0–10.5)
nRBC: 0 % (ref 0.0–0.2)

## 2023-10-23 MED ORDER — OXYCODONE HCL 5 MG PO TABS
5.0000 mg | ORAL_TABLET | ORAL | 0 refills | Status: DC | PRN
  Filled 2023-10-23: qty 60, 5d supply, fill #0

## 2023-10-23 MED ORDER — SODIUM CHLORIDE 0.9 % IV SOLN
400.0000 mg/m2 | Freq: Once | INTRAVENOUS | Status: AC
  Administered 2023-10-23: 600 mg via INTRAVENOUS
  Filled 2023-10-23: qty 20

## 2023-10-23 MED ORDER — SODIUM CHLORIDE 0.9% FLUSH
10.0000 mL | Freq: Once | INTRAVENOUS | Status: AC
  Administered 2023-10-23: 10 mL

## 2023-10-23 MED ORDER — CYANOCOBALAMIN 1000 MCG/ML IJ SOLN
1000.0000 ug | Freq: Once | INTRAMUSCULAR | Status: AC
Start: 2023-10-23 — End: 2023-10-23
  Administered 2023-10-23: 1000 ug via INTRAMUSCULAR
  Filled 2023-10-23: qty 1

## 2023-10-23 MED ORDER — SODIUM CHLORIDE 0.9 % IV SOLN: INTRAVENOUS | Status: DC

## 2023-10-23 MED ORDER — PROCHLORPERAZINE MALEATE 10 MG PO TABS
10.0000 mg | ORAL_TABLET | Freq: Once | ORAL | Status: AC
  Administered 2023-10-23: 10 mg via ORAL
  Filled 2023-10-23: qty 1

## 2023-10-23 NOTE — Patient Instructions (Signed)
 [facility] - A DEPT OF [facility]  Discharge Instructions: Thank you for choosing [facility] to provide your oncology and hematology care.   If you have a lab appointment with the Cancer Center, please go directly to the Cancer Center and check in at the registration area.   Wear comfortable clothing and clothing appropriate for easy access to any Portacath or PICC line.   We strive to give you quality time with your provider. You may need to reschedule your appointment if you arrive late (15 or more minutes).  Arriving late affects you and other patients whose appointments are after yours.  Also, if you miss three or more appointments without notifying the office, you may be dismissed from the clinic at the provider's discretion.      For prescription refill requests, have your pharmacy contact our office and allow 72 hours for refills to be completed.    Today you received the following chemotherapy and/or immunotherapy agents: pemetrexed      To help prevent nausea and vomiting after your treatment, we encourage you to take your nausea medication as directed.  BELOW ARE SYMPTOMS THAT SHOULD BE REPORTED IMMEDIATELY: *FEVER GREATER THAN 100.4 F (38 C) OR HIGHER *CHILLS OR SWEATING *NAUSEA AND VOMITING THAT IS NOT CONTROLLED WITH YOUR NAUSEA MEDICATION *UNUSUAL SHORTNESS OF BREATH *UNUSUAL BRUISING OR BLEEDING *URINARY PROBLEMS (pain or burning when urinating, or frequent urination) *BOWEL PROBLEMS (unusual diarrhea, constipation, pain near the anus) TENDERNESS IN MOUTH AND THROAT WITH OR WITHOUT PRESENCE OF ULCERS (sore throat, sores in mouth, or a toothache) UNUSUAL RASH, SWELLING OR PAIN  UNUSUAL VAGINAL DISCHARGE OR ITCHING   Items with * indicate a potential emergency and should be followed up as soon as possible or go to the Emergency Department if any problems should occur.  Please show the CHEMOTHERAPY ALERT CARD or IMMUNOTHERAPY  ALERT CARD at check-in to the Emergency Department and triage nurse.  Should you have questions after your visit or need to cancel or reschedule your appointment, please contact [facility] - A DEPT OF [facility]  Dept: [phone]  and follow the prompts.  Office hours are 8:00 a.m. to 4:30 p.m. Monday - Friday. Please note that voicemails left after 4:00 p.m. may not be returned until the following business day.  We are closed weekends and major holidays. You have access to a nurse at all times for urgent questions. Please call the main number to the clinic Dept: [phone] and follow the prompts.   For any non-urgent questions, you may also contact your provider using MyChart. We now offer e-Visits for anyone 30 and older to request care online for non-urgent symptoms. For details visit [email or web address].   Also download the MyChart app! Go to the app store, search "MyChart", open the app, select [place], and log in with your MyChart username and password.

## 2023-10-24 ENCOUNTER — Other Ambulatory Visit (HOSPITAL_COMMUNITY): Payer: Self-pay

## 2023-10-26 ENCOUNTER — Encounter: Payer: Self-pay | Admitting: Acute Care

## 2023-10-31 ENCOUNTER — Ambulatory Visit (HOSPITAL_COMMUNITY): Attending: Cardiology

## 2023-10-31 DIAGNOSIS — I5030 Unspecified diastolic (congestive) heart failure: Secondary | ICD-10-CM

## 2023-10-31 DIAGNOSIS — I081 Rheumatic disorders of both mitral and tricuspid valves: Secondary | ICD-10-CM

## 2023-10-31 DIAGNOSIS — R0602 Shortness of breath: Secondary | ICD-10-CM | POA: Diagnosis present

## 2023-10-31 LAB — ECHOCARDIOGRAM COMPLETE
Area-P 1/2: 3.91 cm2
S' Lateral: 2.79 cm

## 2023-11-01 ENCOUNTER — Ambulatory Visit: Payer: Self-pay | Admitting: Cardiology

## 2023-11-06 ENCOUNTER — Ambulatory Visit (HOSPITAL_COMMUNITY)
Admission: RE | Admit: 2023-11-06 | Discharge: 2023-11-06 | Disposition: A | Discharge status: Home / Self Care | Source: Ambulatory Visit | Attending: Physician Assistant | Admitting: Physician Assistant

## 2023-11-06 DIAGNOSIS — C3491 Malignant neoplasm of unspecified part of right bronchus or lung: Secondary | ICD-10-CM | POA: Diagnosis present

## 2023-11-08 ENCOUNTER — Ambulatory Visit

## 2023-11-08 ENCOUNTER — Encounter: Payer: Self-pay | Admitting: Physician Assistant

## 2023-11-08 ENCOUNTER — Encounter (HOSPITAL_COMMUNITY): Payer: Self-pay

## 2023-11-08 ENCOUNTER — Other Ambulatory Visit: Payer: Self-pay | Admitting: Physician Assistant

## 2023-11-08 ENCOUNTER — Other Ambulatory Visit

## 2023-11-08 ENCOUNTER — Encounter: Payer: Self-pay | Admitting: Internal Medicine

## 2023-11-08 ENCOUNTER — Other Ambulatory Visit (HOSPITAL_COMMUNITY): Payer: Self-pay

## 2023-11-08 ENCOUNTER — Ambulatory Visit: Admitting: Physician Assistant

## 2023-11-08 DIAGNOSIS — D509 Iron deficiency anemia, unspecified: Secondary | ICD-10-CM

## 2023-11-08 MED ORDER — FOLIVANE-PLUS PO CAPS
1.0000 | ORAL_CAPSULE | Freq: Every morning | ORAL | 0 refills | Status: DC
  Filled 2023-11-08: qty 90, 90d supply, fill #0

## 2023-11-09 ENCOUNTER — Other Ambulatory Visit (HOSPITAL_COMMUNITY): Payer: Self-pay

## 2023-11-09 NOTE — Progress Notes (Unsigned)
 [facility] OFFICE PROGRESS NOTE  Yolanda Hence, MD [address] [place] [address]  DIAGNOSIS: Stage IV (T2 a,N2, M1a) non-small cell lung cancer, adenocarcinoma diagnosed in July 2019 and presented with right upper lobe lung mass in addition to mediastinal lymphadenopathy as well as bilateral pulmonary nodules and malignant right pleural effusion.   Biomarker Findings Microsatellite status - Cannot Be Determined Tumor Mutational Burden - Cannot Be Determined Genomic Findings For a complete list of the genes assayed, please refer to the Appendix. EGFR exon 19 deletion ([id number]>L) TP53 Y220C 7 Disease relevant genes with no reportable alterations: KRAS, ALK, BRAF, MET, RET, ERBB2, ROS1    Repeat molecular studies in 2025 showed no new mutations  PRIOR THERAPY: 1) Status post right Pleurx catheter placement by Dr. Matt Song for drainage of malignant right pleural effusion. 2) palliative radiotherapy to the enlarging right upper lobe lung mass and mediastinum under the care of Dr. Eloise Hake expected to be completed on January 05, 2021. 3) Systemic chemotherapy with carboplatin  for AUC of 5 and Alimta  500 Mg/M2 every 3 weeks.  First dose 03/17/2021.  The patient will also continue her current treatment with Tagrisso  80 mg p.o. daily.  She is status post 26 cycles. Starting from cycle #7, she will be on Alimta  only 400 mg/m2.  Her treatment with Alimta  has been on hold for several months now.   CURRENT THERAPY: Tagrisso  80 mg p.o. daily.  First dose was given on 01/29/2018.  Status post 68.5 months of treatment.  IV chemotherapy with alimta  400 mg/m2 IV every 3 weeks to start around 09/11/23. Status post 3 cycles.   INTERVAL HISTORY: Allison [name] 73 y.o. female returns to the clinic today for a follow-up visit accompanied by her daughter and significant other.  In summary, the patient was recently found to have disease progression.  Therefore, she restarted  systemic chemotherapy with single agent Alimta  dose reduced to 400 mg/m for tolerability.  She is status post 3 cycles of treatment and tolerated it well overall.  In summary, she has been experiencing a persistent cough and progressive weakness since January 2025. She is also on low-dose prednisone  for her cough and appetite.   When I last saw the patient 3 weeks ago, the patient was looking and feeling significantly better compared to prior. She previously had difficulties talking without coughing. She was able to converse during the encounter without hardly coughing and she also walked into the clinic without the use of the wheelchair. She feels better with reduced need for a wheelchair and improved energy levels, although she still experiences fatigue and needs to rest frequently but overall she is more active than before. She continues to experience fatigue following her treatments, which she anticipates and plans for by running errands on days she feels better. She mentions that she will be 'down for the next few days' after exerting herself.  She has been experiencing night sweats consistently every night, requiring her to change clothes due to being soaked. This has been ongoing for several years but has become more frequent over the last few months.   No fever, infections, or new night sweats beyond the usual.  She describes new onset jaw and face pain, and recalls that her dentist, who is also her nephew, thought it might be related to sinusitis. The pain is sharp, bilateral, and occurs unpredictably. She uses Xanax  to manage the discomfort and practices meditation to relax her jaw muscles. She denies teeth  grinding and recalls that her dentist did not recommend a mouth guard.  Her cough has improved significantly, and she has not needed to use her cough syrup for over a month. However, she still experiences shortness of breath.  She has been gaining weight, currently at 125 pounds, and is  eating well. She notes that she has been able to drive recently, which she had not done for months. She has sporadic diarrhea but it is self limiting.   She mentions bruising from her Lovenox  injections, which she has moved to her 'love handles' to give her abdomen a break. She uses a cane for balance and mobility, especially in public.  She recently had a restaging CT scan performed.    She is here today for evaluation repeat blood work before undergoing cycle #4    MEDICAL HISTORY: Past Medical History:  Diagnosis Date   Anemia    Anxiety    Arthritis    Asthma    exercise induced   Coronary artery disease    Depression    PMH   Dyspnea    GERD (gastroesophageal reflux disease)    Glaucoma    History of radiation therapy 01/05/2021   IMRT right lung  11/24/2020-01/05/2021  Dr Retta Caster   Hypertension    lung ca dx'd 11/2017   right   Malignant pleural effusion    right   Nuclear sclerotic cataract of right eye 02/28/2021   Dr. Devin Foerster, cataract surgery February 2023   PONV (postoperative nausea and vomiting)    Pre-diabetes    Raynaud's disease    Raynaud's disease    Stroke (HCC) 01/2021   balance off, some express aphasia, weakness    ALLERGIES:  is allergic to hydrocodone -acetaminophen , other, penicillins, lactose, vicodin [hydrocodone -acetaminophen ], bacid, and ipratropium-albuterol .  MEDICATIONS:  Current Outpatient Medications  Medication Sig Dispense Refill   ALPRAZolam  (XANAX ) 0.25 MG tablet Take 1 tablet (0.25 mg total) by mouth 2 (two) times daily as needed for anxiety. May take 2 tablets (0.5 mg total) at bedtime. (Patient taking differently: Take 0.25-0.5 mg by mouth 2 (two) times daily as needed for anxiety or sleep.) 60 tablet 0   ALPRAZolam  (XANAX ) 0.25 MG tablet Take 1 tablet (0.25 mg total) by mouth 3 (three) times daily as needed. 90 tablet 3   amLODipine  (NORVASC ) 5 MG tablet Take 1 tablet (5 mg total) by mouth daily. (Patient not taking:  Reported on 09/19/2023) 90 tablet 3   carvedilol  (COREG ) 6.25 MG tablet Take 1 tablet (6.25 mg total) by mouth 2 (two) times daily. 180 tablet 3   dexamethasone  (DECADRON ) 4 MG tablet Take 1 tablet twice a day the day before, day of, and day after treatment (Patient not taking: Reported on 09/07/2023) 40 tablet 2   dorzolamide -timolol  (COSOPT ) 22.3-6.8 MG/ML ophthalmic solution Place 1 drop into both eyes 2 (two) times daily.     doxycycline  (VIBRA -TABS) 100 MG tablet Take 1 tablet (100 mg total) by mouth 2 (two) times daily. 14 tablet 0   enoxaparin  (LOVENOX ) 60 MG/0.6ML injection Inject 0.55 mLs (55 mg total) into the skin every 12 (twelve) hours. 100 mL 1   ezetimibe  (ZETIA ) 10 MG tablet Take 10 mg by mouth daily.     FeFum-FePoly-FA-B Cmp-C-Biot (FOLIVANE-PLUS) CAPS Take 1 capsule by mouth in the morning. 90 capsule 0   fentaNYL  (DURAGESIC ) 12 MCG/HR Place 1 patch onto the skin every 3 (three) days. 10 patch 0   FLUoxetine  (PROZAC ) 20 MG capsule Take 1  capsule (20 mg total) by mouth daily. 90 capsule 3   folic acid  (FOLVITE ) 1 MG tablet TAKE 1 TABLET BY MOUTH EVERY DAY 90 tablet 1   gabapentin  (NEURONTIN ) 250 MG/5ML solution Take 4 mLs (200 mg total) by mouth at bedtime. 470 mL 1   Glycopyrrolate -Formoterol  (BEVESPI  AEROSPHERE) 9-4.8 MCG/ACT AERO Inhale 2 puffs into the lungs 2 (two) times daily. (Patient not taking: Reported on 09/19/2023) 10.7 g 3   HYDROcodone  bit-homatropine (HYCODAN) 5-1.5 MG/5ML syrup Take 5 mLs by mouth every 6 (six) hours as needed for cough. (Patient not taking: Reported on 09/26/2023) 240 mL 0   hyoscyamine  (LEVSIN SL) 0.125 MG SL tablet Place 1-2 tablets (0.125-0.25mg ) under the tongue every 6 (six) hours as needed for cramping. 30 tablet 3   isosorbide  mononitrate (IMDUR ) 30 MG 24 hr tablet Take 1 tablet (30 mg total) by mouth daily. 90 tablet 3   latanoprost  (XALATAN ) 0.005 % ophthalmic solution Place 1 drop into both eyes at bedtime.      ondansetron  (ZOFRAN -ODT) 4 MG  disintegrating tablet Take 1 tablet (4 mg total) by mouth every 8 (eight) hours as needed for nausea or vomiting. (Patient taking differently: Take 4 mg by mouth every 8 (eight) hours as needed for nausea or vomiting (dissolve orally).) 30 tablet 1   osimertinib  mesylate (TAGRISSO ) 80 MG tablet Take 1 tablet (80 mg total) by mouth daily. 30 tablet 4   oxyCODONE  (OXY IR/ROXICODONE ) 5 MG immediate release tablet Take 1-2 tablets (5-10 mg total) by mouth every 4 (four) hours as needed for severe pain (pain score 7-10) or breakthrough pain. 60 tablet 0   PEPCID  AC 10 MG tablet Take 10 mg by mouth 2 (two) times daily.     predniSONE  (DELTASONE ) 10 MG tablet Take 1 tablet (10 mg total) by mouth daily with breakfast. 30 tablet 2   prochlorperazine  (COMPAZINE ) 10 MG tablet Take 1 tablet (10 mg total) by mouth every 6 (six) hours as needed. (Patient taking differently: Take 10 mg by mouth every 6 (six) hours as needed for nausea or vomiting.) 30 tablet 2   REPATHA  SURECLICK 140 MG/ML SOAJ Inject 140 mg into the skin every 14 (fourteen) days. 6 mL 3   senna-docusate (SENNA S) 8.6-50 MG tablet Take 2 tablets by mouth at bedtime. 60 tablet 3   Wheat Dextrin (BENEFIBER DRINK MIX) PACK Take 1 packet by mouth daily.     No current facility-administered medications for this visit.    SURGICAL HISTORY:  Past Surgical History:  Procedure Laterality Date   ABDOMINAL HYSTERECTOMY     partial   BRONCHIAL BIOPSY  04/21/2021   Procedure: BRONCHIAL BIOPSIES;  Surgeon: Prudy Brownie, DO;  Location: [facility];  Service: Pulmonary;;   BRONCHIAL BRUSHINGS  04/21/2021   Procedure: BRONCHIAL BRUSHINGS;  Surgeon: Prudy Brownie, DO;  Location: [facility];  Service: Pulmonary;;   BRONCHIAL NEEDLE ASPIRATION BIOPSY  04/21/2021   Procedure: BRONCHIAL NEEDLE ASPIRATION BIOPSIES;  Surgeon: Prudy Brownie, DO;  Location: [facility];  Service: Pulmonary;;   CHEST TUBE INSERTION Right 01/01/2018   Procedure: INSERTION  PLEURAL DRAINAGE CATHETER;  Surgeon: Heriberto London, MD;  Location: [facility] OR;  Service: Thoracic;  Laterality: Right;   CHEST TUBE INSERTION  04/21/2021   Procedure: CHEST TUBE INSERTION;  Surgeon: Prudy Brownie, DO;  Location: [facility];  Service: Pulmonary;;   COLONOSCOPY     CORONARY STENT INTERVENTION N/A 06/16/2019   Procedure: CORONARY STENT INTERVENTION;  Surgeon: Avery Bodo  S, MD;  Location: [facility];  Service: Cardiovascular;  Laterality: N/A;   DILATION AND CURETTAGE OF UTERUS     EYE SURGERY     due to Glaucoma   IR IMAGING GUIDED PORT INSERTION  03/22/2021   IR PORT REPAIR CENTRAL VENOUS ACCESS DEVICE  04/15/2021   LEFT HEART CATH AND CORONARY ANGIOGRAPHY N/A 06/16/2019   Procedure: LEFT HEART CATH AND CORONARY ANGIOGRAPHY;  Surgeon: Lucendia Rusk, MD;  Location: [facility] INVASIVE CV LAB;  Service: Cardiovascular;  Laterality: N/A;   REMOVAL OF PLEURAL DRAINAGE CATHETER Right 11/07/2018   Procedure: REMOVAL OF PLEURAL DRAINAGE CATHETER;  Surgeon: Heriberto London, MD;  Location: [facility] OR;  Service: Thoracic;  Laterality: Right;   ROTATOR CUFF REPAIR     TUBAL LIGATION     VIDEO BRONCHOSCOPY WITH ENDOBRONCHIAL NAVIGATION Bilateral 04/21/2021   Procedure: VIDEO BRONCHOSCOPY WITH ENDOBRONCHIAL NAVIGATION;  Surgeon: Prudy Brownie, DO;  Location: [facility];  Service: Pulmonary;  Laterality: Bilateral;  ION   WISDOM TOOTH EXTRACTION      REVIEW OF SYSTEMS:   Review of Systems  Constitutional: Positive for fatigue. Improving appetite. Negative for chills and fever.  HENT:  Negative for mouth sores, nosebleeds, sore throat and trouble swallowing.   Eyes: Negative for eye problems and icterus.  Respiratory: Positive for improving cough and persistent shortness of breath with exertion. Negative for hemoptysis and wheezing.   Cardiovascular: Positive for improving right rib discomfort.Negative for leg swelling.  Gastrointestinal: Occasional mild nausea.Occasional  mild diarrhea. Negative for abdominal pain  and vomiting.  Genitourinary: Negative for bladder incontinence, difficulty urinating, dysuria, frequency and hematuria.   Musculoskeletal: Negative for gait problem, neck pain and neck stiffness.  Skin: Negative for itching and rash.  Neurological: Negative for dizziness, extremity weakness, gait problem, headaches, light-headedness and seizures.  Hematological: Negative for adenopathy. Does not bruise/bleed easily.  Psychiatric/Behavioral: Negative for confusion, depression and sleep disturbance. The patient is not nervous/anxious.    PHYSICAL EXAMINATION:  Blood pressure 134/74, pulse 79, temperature 98.2 F (36.8 C), temperature source Temporal, resp. rate 14, weight 125 lb 12.8 oz (57.1 kg), SpO2 100%.  ECOG PERFORMANCE STATUS: 1  Physical Exam  Constitutional: Oriented to person, place, and time and thin appearing female and in no distress.   HENT:  Head: Normocephalic and atraumatic.  Mouth/Throat: Oropharynx is clear and moist. No oropharyngeal exudate.  Eyes: Conjunctivae are normal. Right eye exhibits no discharge. Left eye exhibits no discharge. No scleral icterus.  Neck: Normal range of motion. Neck supple.  Cardiovascular: Normal rate, regular rhythm, normal heart sounds and intact distal pulses.   Pulmonary/Chest: Effort normal and breath sounds clear to auscultation. No respiratory distress. No wheezes. No rales.  Abdominal: Soft. Bowel sounds are normal. Exhibits no distension and no mass. There is no tenderness.  Musculoskeletal: Normal range of motion. Exhibits no edema.  Lymphadenopathy:    No cervical adenopathy.  Neurological: Alert and oriented to person, place, and time. Exhibits muscle wasting. Gait normal. Coordination normal. Uses a cane for ambulation.  Skin: Skin is warm and dry. No rash noted. Not diaphoretic. No erythema. No pallor.  Psychiatric: Mood, memory and judgment normal.  Vitals reviewed.  LABORATORY  DATA: Lab Results  Component Value Date   WBC 7.1 11/14/2023   HGB 11.2 (L) 11/14/2023   HCT 34.8 (L) 11/14/2023   MCV 84.5 11/14/2023   PLT 226 11/14/2023      Chemistry      Component Value Date/Time  NA 138 10/23/2023 1436   NA 140 03/04/2021 1516   K 3.6 10/23/2023 1436   CL 107 10/23/2023 1436   CO2 26 10/23/2023 1436   BUN 19 10/23/2023 1436   BUN 21 03/04/2021 1516   CREATININE 1.15 (H) 10/23/2023 1436      Component Value Date/Time   CALCIUM  9.0 10/23/2023 1436   ALKPHOS 50 10/23/2023 1436   AST 19 10/23/2023 1436   ALT 32 10/23/2023 1436   BILITOT 0.2 10/23/2023 1436       RADIOGRAPHIC STUDIES:  CT CHEST ABDOMEN PELVIS WO CONTRAST Result Date: 11/06/2023 CLINICAL DATA:  Metastatic lung cancer restaging * Tracking Code: BO * EXAM: CT CHEST, ABDOMEN AND PELVIS WITHOUT CONTRAST TECHNIQUE: Multidetector CT imaging of the chest, abdomen and pelvis was performed following the standard protocol without IV contrast. RADIATION DOSE REDUCTION: This exam was performed according to the departmental dose-optimization program which includes automated exposure control, adjustment of the mA and/or kV according to patient size and/or use of iterative reconstruction technique. COMPARISON:  CT chest, 09/07/2023, PET-CT, 08/13/2023 FINDINGS: CT CHEST FINDINGS Cardiovascular: Right chest port catheter. Aortic atherosclerosis. Normal heart size. Left and right coronary artery calcifications and stents. No pericardial effusion. Mediastinum/Nodes: Treated soft tissue about the right hilum not significantly changed, without discretely visible enlarged lymph nodes on noncontrast CT (series 2, image 20). Thyroid  gland, trachea, and esophagus demonstrate no significant findings. Lungs/Pleura: When compared to prior examination of the chest dated 09/07/2023, significant interval improvement in very dense perihilar consolidation and nodularity throughout the lung. Persistent, diffuse pleural  thickening, interlobular septal thickening, and small right pleural effusion. Resolution of a previously seen small left pleural effusion. Minimal interlobular septal thickening throughout the left hemithorax as well as minimal, persistent pleural thickening, diminished. Decreased size of a nodule of the dependent left lung base measuring 1.0 x 0.8 cm, previously 1.4 x 1.4 cm (series 4, image 113). Musculoskeletal: No chest wall abnormality. No acute osseous findings. CT ABDOMEN PELVIS FINDINGS Hepatobiliary: No solid liver abnormality is seen. No gallstones, gallbladder wall thickening, or biliary dilatation. Pancreas: Unremarkable. No pancreatic ductal dilatation or surrounding inflammatory changes. Spleen: Normal in size without significant abnormality. Adrenals/Urinary Tract: Adrenal glands are unremarkable. Kidneys are normal, without renal calculi, solid lesion, or hydronephrosis. Bladder is unremarkable. Stomach/Bowel: Stomach is within normal limits. Appendix appears normal. No evidence of bowel wall thickening, distention, or inflammatory changes. Sigmoid diverticulosis. Vascular/Lymphatic: Aortic atherosclerosis. No enlarged abdominal or pelvic lymph nodes. Reproductive: Status post hysterectomy. Other: No abdominal wall hernia or abnormality. Fluid in the low right hemipelvis, which on prior examination dated 08/13/2023 appear to be within bowel loops (series 2, image 96). Musculoskeletal: No acute osseous findings. IMPRESSION: 1. When compared to prior examination of the chest dated 09/07/2023, significant interval improvement in very dense perihilar consolidation and nodularity throughout the right lung. Persistent, diffuse pleural thickening, interlobular septal thickening, and small right pleural effusion. 2. Resolution of a previously seen small left pleural effusion. Minimal interlobular septal thickening throughout the left hemithorax as well as minimal, persistent pleural thickening, diminished.  3. Decreased size of a nodule of the dependent left lung base measuring 1.0 x 0.8 cm, previously 1.4 x 1.4 cm. 4. Treated soft tissue about the right hilum not significantly changed, without discretely visible enlarged lymph nodes on noncontrast CT. 5. Constellation of findings consistent with treatment response of right lung malignancy with associated lymphangitic carcinomatosis and pleural metastatic disease. 6. No noncontrast evidence of lymphadenopathy or metastatic disease in the abdomen  or pelvis. 7. Coronary artery disease. 8. Aortic atherosclerosis. Aortic Atherosclerosis (ICD10-I70.0). Electronically Signed   By: Fredricka Jenny M.D.   On: 11/06/2023 11:15   ECHOCARDIOGRAM COMPLETE Result Date: 10/31/2023    ECHOCARDIOGRAM REPORT   Patient Name:   Allison [name] Date of Exam: 10/31/2023 Medical Rec #:  [id number]       Height:       64.0 in Accession #:    [id number]      Weight:       123.5 lb Date of Birth:  [birth year]       BSA:          1.594 m Patient Age:    72 years        BP:           158/84 mmHg Patient Gender: F               HR:           71 bpm. Exam Location:  [address] Procedure: 2D Echo, 3D Echo and Strain Analysis (Both Spectral and Color Flow            Doppler were utilized during procedure). Indications:    I33.9 SBE; R06.02 SOB  History:        Patient has prior history of Echocardiogram examinations, most                 recent 02/13/2021. CAD and Previous Myocardial Infarction,                 Stroke, Signs/Symptoms:Dyspnea; Risk Factors:Hypertension and                 Dyslipidemia. Aortic atherosclerosis. Pleural effusion. Lung                 cancer. DVT/PE.  Sonographer:    Mylinda Asa RCS Referring Phys: [id number] CHRISTOPHER L SCHUMANN IMPRESSIONS  1. Left ventricular ejection fraction, by estimation, is 55 to 60%. Left ventricular ejection fraction by 3D volume is 55 %. The left ventricle has normal function. The left ventricle has no regional wall motion abnormalities.  Left ventricular diastolic  parameters are consistent with Grade I diastolic dysfunction (impaired relaxation). The average left ventricular global longitudinal strain is -24.5 %. The global longitudinal strain is normal.  2. Right ventricular systolic function is normal. The right ventricular size is normal.  3. The mitral valve is normal in structure. Mild to moderate mitral valve regurgitation. No evidence of mitral stenosis.  4. Tricuspid valve regurgitation is moderate.  5. The aortic valve is normal in structure. Aortic valve regurgitation is not visualized. No aortic stenosis is present.  6. The inferior vena cava is normal in size with greater than 50% respiratory variability, suggesting right atrial pressure of 3 mmHg. FINDINGS  Left Ventricle: Left ventricular ejection fraction, by estimation, is 55 to 60%. Left ventricular ejection fraction by 3D volume is 55 %. The left ventricle has normal function. The left ventricle has no regional wall motion abnormalities. The average left ventricular global longitudinal strain is -24.5 %. Strain was performed and the global longitudinal strain is normal. The left ventricular internal cavity size was normal in size. There is no left ventricular hypertrophy. Left ventricular diastolic parameters are consistent with Grade I diastolic dysfunction (impaired relaxation). Right Ventricle: The right ventricular size is normal. No increase in right ventricular wall thickness. Right ventricular systolic function is normal. Left Atrium: Left atrial size was normal in size. Right  Atrium: Right atrial size was normal in size. Pericardium: There is no evidence of pericardial effusion. Mitral Valve: The mitral valve is normal in structure. Mild to moderate mitral valve regurgitation. No evidence of mitral valve stenosis. Tricuspid Valve: The tricuspid valve is normal in structure. Tricuspid valve regurgitation is moderate . No evidence of tricuspid stenosis. Aortic Valve: The  aortic valve is normal in structure. Aortic valve regurgitation is not visualized. No aortic stenosis is present. Pulmonic Valve: The pulmonic valve was normal in structure. Pulmonic valve regurgitation is trivial. No evidence of pulmonic stenosis. Aorta: The aortic root is normal in size and structure. Venous: The inferior vena cava is normal in size with greater than 50% respiratory variability, suggesting right atrial pressure of 3 mmHg. IAS/Shunts: No atrial level shunt detected by color flow Doppler. Additional Comments: 3D was performed not requiring image post processing on an independent workstation and was normal.  LEFT VENTRICLE PLAX 2D LVIDd:         3.78 cm         Diastology LVIDs:         2.79 cm         LV e' medial:    8.49 cm/s LV PW:         0.97 cm         LV E/e' medial:  9.4 LV IVS:        1.05 cm         LV e' lateral:   10.30 cm/s LVOT diam:     1.70 cm         LV E/e' lateral: 7.8 LV SV:         42 LV SV Index:   27              2D Longitudinal LVOT Area:     2.27 cm        Strain                                2D Strain GLS   -19.4 %                                (A4C):                                2D Strain GLS   -25.9 %                                (A3C):                                2D Strain GLS   -28.1 %                                (A2C):                                2D Strain GLS   -24.5 %  Avg:                                 3D Volume EF                                LV 3D EF:    Left                                             ventricul                                             ar                                             ejection                                             fraction                                             by 3D                                             volume is                                             55 %.                                 3D Volume EF:                                3D EF:        55 %                                 LV EDV:       119 ml                                LV ESV:       54 ml                                LV SV:        65 ml RIGHT VENTRICLE RV Basal diam:  2.96 cm RV S prime:     11.00 cm/s TAPSE (M-mode): 1.9 cm LEFT ATRIUM             Index        RIGHT ATRIUM          Index LA diam:        3.20 cm 2.01 cm/m   RA Area:     7.58 cm LA Vol (A2C):   34.9 ml 21.90 ml/m  RA Volume:   14.40 ml 9.03 ml/m LA Vol (A4C):   20.1 ml 12.61 ml/m LA Biplane Vol: 26.7 ml 16.75 ml/m  AORTIC VALVE LVOT Vmax:   88.60 cm/s LVOT Vmean:  57.200 cm/s LVOT VTI:    0.187 m  AORTA Ao Root diam: 2.80 cm Ao Asc diam:  3.50 cm MITRAL VALVE MV Area (PHT): 3.91 cm     SHUNTS MV Decel Time: 194 msec     Systemic VTI:  0.19 m MV E velocity: 80.20 cm/s   Systemic Diam: 1.70 cm MV A velocity: 105.00 cm/s MV E/A ratio:  0.76 Allison Gathers MD Electronically signed by Allison Gathers MD Signature Date/Time: 10/31/2023/3:32:21 PM    Final      ASSESSMENT/PLAN:  This is a very pleasant 73 year old never smoker African-American female diagnosed with stage IV non-small cell lung cancer, adenocarcinoma.  She was positive for an EGFR mutation with deletion in exon 19.  She was diagnosed in July 2019 and presented with right upper lobe lung mass in addition to mediastinal lymphadenopathy as well as bilateral pulmonary nodules and malignant right pleural effusion.   The patient was started on treatment with Tagrisso  80 mg p.o. daily status post 39 months of treatment. Started on 01/29/2018.   In April 2022, she showed evidence of disease progression with interval progression of the right apical lung mass in addition to progression of mediastinal lymphadenopathy concerning for worsening of her disease. She had repeat Guardant 360 molecular studies which did not show evidence of new resistant mutations.  Therefore, she was referred to radiation oncology and completed palliative radiotherapy to the enlarging right upper lobe lung  mass and mediastinum under the care of Dr. Eloise Hake.  This was completed in July 2022.    The patient underwent a repeat bronchoscopy and biopsy for repeat molecular testing.  The sample from the left and right lung biopsy was negative for malignancy. This was performed in November 2022.    Unfortunately, the patient  was found to have evidence of disease progression.  Therefore, Dr. Marguerita Shih started the patient on systemic chemotherapy with carboplatin  for AUC of 5 and Alimta  500 mg per metered squared.  She is status post 23 cycles and she tolerated it fairly well despite her ongoing issues with fatigue, chest tightness, cough, and weight loss.  Alimta  was reduced to 400 mg per metered squared due to renal insufficiency.  She is not a good candidate for Avastin due to her recent CVA in August 2022.  She also is continuing to take Tagrisso  as it is protective against progressive metastatic disease to the brain. Starting from cycle #7,  she started maintenance single agent alimta . Her treatment has been on hold since March 2024 due to side effects of treatment.    Of note, the patient saw Dr. Baker Bon from [facility] for second opinion in November 2022.  They discussed if she progresses on chemotherapy that there may be upcoming options. He discussed other treatment options which may be available in  2023 including patritumab deruxtcan and lazertinib/amivantmab.    Her most recent restaging CT scan showed disease progression with new and increasing septal thickening and perilymphatic nodularity throughout the right hemithorax suspicious for lymphangitic spread of tumor.  A PET scan to confirm this.   Therefore, at her appointment on 08/23/2023 we discussed the options.  She had repeat molecular studies performed which did not show any new actionable mutations.  Therefore the plan will be to proceed with single agent Alimta  IV every 3 weeks as scheduled.  The alternative options that we discussed was  adding carboplatin  to the Alimta .  We also talked about amivantamab and lazertinib, but the patient and Dr. Marguerita Shih have concerns about quality of life and side effect profile, although this likely would be effective   The patient opted to proceed on single agent chemotherapy with Alimta  400 mg/m.  She started this on 09/11/2023.  She tolerated this fairly well. She is status post 3 weeks of treatment.    She looks much improved since she started treatment.   The patient was seen with Dr. Marguerita Shih today.  Dr. Marguerita Shih personally and independently reviewed the scan and discussed results with the patient today.  The scan showed a positive response to treatment..  Dr. Marguerita Shih recommends she continue on treatment at the same dose.   Labs were reviewed.  Recommend that she proceed with cycle 4 today scheduled as long as her CMP allows.     We will see her back for follow-up visit in 3 weeks for evaluation review blood work before undergoing cycle #5     She will continue taking prednisone  10 mg daily.   She will continue taking Hycodan for cough, she is not needing this as frequently.  She will continue her folic acid .  She will continue taking her Tagrisso  for neuroprotection.  She will continue taking a PPI for heartburn. She is   She will continue to follow with palliative care for pain management.   She is seeing pulmonary medicine later this week and is going to inquire about pulmonary rehab.    The patient will continue being compliant with her Lovenox  due to her upper extremity DVTs.  The patient is having a lot of bruising from her injections twice a day.  Therefore Dr. Marguerita Shih states that we can move to once a day and would recommend 80 mg/0.8.ml We will relay this to her PCP   Anemia Hemoglobin at 11.2, likely secondary to chemotherapy. Discussed impact on hematologic parameters and need for monitoring. - Monitor hemoglobin levels regularly.   Pain management Pain improved with daily  oxycodone  and effective fentanyl  patch. Pain management crucial for quality of life. - Continue oxycodone  as needed. - Continue fentanyl  patch every three days. - Refill oxycodone  prescription.   Shortness of breath Exertional dyspnea persists but manageable. Increased activity tolerance noted. - Encourage pacing activities to manage symptoms.   Chronic cough Cough controlled with Bevespi  and occasional hycodan. No recent exacerbations. -She is coughing significantly less - Continue Bevespi  for cough management. - Use hydrocodone  as needed for cough. She has not needed this in about 1 month.   Jaw pain possibly due to TMJ Intermittent sharp jaw pain, possibly TMJ-related. Pain bilateral, sometimes requires Xanax . Practices meditation, considering acetaminophen . - Discuss with dentist during next appointment in June. - Consider using acetaminophen  for pain management if needed.     Anxiety Anxiety managed with nightly Xanax .  I have written her out of jury duty given her  overall condition.   The patient was advised to call immediately if she has any concerning symptoms in the interval. The patient voices understanding of current disease status and treatment options and is in agreement with the current care plan. All questions were answered. The patient knows to call the clinic with any problems, questions or concerns. We can certainly see the patient much sooner if necessary    No orders of the defined types were placed in this encounter.     Nevan Creighton L Brookelle Pellicane, PA-C 11/14/23  ADDENDUM: Hematology/Oncology Attending: I had a face-to-face encounter with the patient today.  I reviewed her records, lab, scan and recommended her care plan.  This is a very pleasant 73 years old African-American female with a stage IV non-small cell lung cancer, adenocarcinoma diagnosed in July 2019 and presented with right upper lobe lung mass in addition to mediastinal lymphadenopathy and  bilateral pulmonary nodules in addition to malignant right pleural effusion.  Her tumor was positive for EGFR mutation exon 19 deletion.  The patient started systemic therapy initially with targeted therapy with Tagrisso  80 mg p.o. daily since August 2019.  She had some evidence for disease progression in July 2022 and she received palliative radiotherapy to enlarging right upper lobe lung mass and mediastinum.  Then she had additional systemic chemotherapy with carboplatin  and Alimta  in addition to Tagrisso  for 6 cycles followed by maintenance treatment with single agent Alimta  at a reduced dose of 400 Mg/M2 and Tagrisso  80 mg p.o. daily and she completed 26 cycles of this treatment when she took a break of the chemotherapy with Alimta  for several months.  She had evidence for disease progression and we resumed her maintenance treatment with Alimta  again in addition to Tagrisso  in March 2025 status post 3 cycles of the treatment. She has been tolerating her treatment fairly well.  She also has significant improvement in her symptoms in general condition. She had repeat CT scan of the chest, abdomen and pelvis performed recently.  I personally and independently reviewed the scan and discussed the result with the patient and her boyfriend.  Her scan showed significant interval improvement in the very dense perihilar consolidation and nodularity throughout the right lung with persistent diffuse pleural thickening.  There was also resolution of the previously seen small pleural effusion and decreased size of the nodule of the dependent lung base. I recommended for the patient to continue her current treatment with Tagrisso  80 mg p.o. daily in addition to Alimta  400 Mg/M2 every 3 weeks. She will proceed with cycle #4 today. She will come back for follow-up visit in 3 weeks for evaluation before starting cycle #5. The patient was advised to call immediately if she has any other concerning symptoms in the  interval. The total time spent in the appointment was 30 minutes including review of chart and various tests results, discussions about plan of care and coordination of care plan . Disclaimer: This note was dictated with voice recognition software. Similar sounding words can inadvertently be transcribed and may be missed upon review. Aurelio Blower, MD

## 2023-11-14 ENCOUNTER — Encounter: Payer: Self-pay | Admitting: Internal Medicine

## 2023-11-14 ENCOUNTER — Inpatient Hospital Stay: Admitting: Physician Assistant

## 2023-11-14 ENCOUNTER — Inpatient Hospital Stay (HOSPITAL_BASED_OUTPATIENT_CLINIC_OR_DEPARTMENT_OTHER): Admitting: Nurse Practitioner

## 2023-11-14 ENCOUNTER — Encounter: Payer: Self-pay | Admitting: Nurse Practitioner

## 2023-11-14 ENCOUNTER — Other Ambulatory Visit (HOSPITAL_COMMUNITY): Payer: Self-pay

## 2023-11-14 ENCOUNTER — Other Ambulatory Visit

## 2023-11-14 ENCOUNTER — Telehealth: Payer: Self-pay

## 2023-11-14 ENCOUNTER — Encounter: Payer: Self-pay | Admitting: General Practice

## 2023-11-14 ENCOUNTER — Inpatient Hospital Stay

## 2023-11-14 ENCOUNTER — Encounter: Payer: Self-pay | Admitting: Physician Assistant

## 2023-11-14 VITALS — BP 134/74 | HR 79 | Temp 98.2°F | Resp 14 | Wt 125.8 lb

## 2023-11-14 DIAGNOSIS — C3490 Malignant neoplasm of unspecified part of unspecified bronchus or lung: Secondary | ICD-10-CM

## 2023-11-14 DIAGNOSIS — K5903 Drug induced constipation: Secondary | ICD-10-CM

## 2023-11-14 DIAGNOSIS — G893 Neoplasm related pain (acute) (chronic): Secondary | ICD-10-CM

## 2023-11-14 DIAGNOSIS — C3491 Malignant neoplasm of unspecified part of right bronchus or lung: Secondary | ICD-10-CM

## 2023-11-14 DIAGNOSIS — Z515 Encounter for palliative care: Secondary | ICD-10-CM | POA: Diagnosis not present

## 2023-11-14 DIAGNOSIS — Z5111 Encounter for antineoplastic chemotherapy: Secondary | ICD-10-CM

## 2023-11-14 DIAGNOSIS — Z95828 Presence of other vascular implants and grafts: Secondary | ICD-10-CM

## 2023-11-14 LAB — CBC WITH DIFFERENTIAL (CANCER CENTER ONLY)
Abs Immature Granulocytes: 0.03 10*3/uL (ref 0.00–0.07)
Basophils Absolute: 0 10*3/uL (ref 0.0–0.1)
Basophils Relative: 0 %
Eosinophils Absolute: 0 10*3/uL (ref 0.0–0.5)
Eosinophils Relative: 0 %
HCT: 34.8 % — ABNORMAL LOW (ref 36.0–46.0)
Hemoglobin: 11.2 g/dL — ABNORMAL LOW (ref 12.0–15.0)
Immature Granulocytes: 0 %
Lymphocytes Relative: 12 %
Lymphs Abs: 0.8 10*3/uL (ref 0.7–4.0)
MCH: 27.2 pg (ref 26.0–34.0)
MCHC: 32.2 g/dL (ref 30.0–36.0)
MCV: 84.5 fL (ref 80.0–100.0)
Monocytes Absolute: 0.2 10*3/uL (ref 0.1–1.0)
Monocytes Relative: 3 %
Neutro Abs: 6 10*3/uL (ref 1.7–7.7)
Neutrophils Relative %: 85 %
Platelet Count: 226 10*3/uL (ref 150–400)
RBC: 4.12 MIL/uL (ref 3.87–5.11)
RDW: 19 % — ABNORMAL HIGH (ref 11.5–15.5)
WBC Count: 7.1 10*3/uL (ref 4.0–10.5)
nRBC: 0 % (ref 0.0–0.2)

## 2023-11-14 LAB — CMP (CANCER CENTER ONLY)
ALT: 32 U/L (ref 0–44)
AST: 22 U/L (ref 15–41)
Albumin: 3.7 g/dL (ref 3.5–5.0)
Alkaline Phosphatase: 48 U/L (ref 38–126)
Anion gap: 7 (ref 5–15)
BUN: 17 mg/dL (ref 8–23)
CO2: 24 mmol/L (ref 22–32)
Calcium: 9.1 mg/dL (ref 8.9–10.3)
Chloride: 107 mmol/L (ref 98–111)
Creatinine: 1.19 mg/dL — ABNORMAL HIGH (ref 0.44–1.00)
GFR, Estimated: 49 mL/min — ABNORMAL LOW (ref >60)
Glucose, Bld: 153 mg/dL — ABNORMAL HIGH (ref 70–99)
Potassium: 3.7 mmol/L (ref 3.5–5.1)
Sodium: 138 mmol/L (ref 135–145)
Total Bilirubin: 0.3 mg/dL (ref 0.0–1.2)
Total Protein: 6.5 g/dL (ref 6.5–8.1)

## 2023-11-14 MED ORDER — PROCHLORPERAZINE MALEATE 10 MG PO TABS
10.0000 mg | ORAL_TABLET | Freq: Once | ORAL | Status: AC
  Administered 2023-11-14: 10 mg via ORAL
  Filled 2023-11-14: qty 1

## 2023-11-14 MED ORDER — PREDNISONE 10 MG PO TABS
10.0000 mg | ORAL_TABLET | Freq: Every day | ORAL | 2 refills | Status: DC
  Filled 2023-11-14 – 2023-12-10 (×2): qty 30, 30d supply, fill #0
  Filled 2024-01-11: qty 30, 30d supply, fill #1

## 2023-11-14 MED ORDER — SODIUM CHLORIDE 0.9% FLUSH
10.0000 mL | Freq: Once | INTRAVENOUS | Status: AC
  Administered 2023-11-14: 10 mL

## 2023-11-14 MED ORDER — SODIUM CHLORIDE 0.9% FLUSH
10.0000 mL | INTRAVENOUS | Status: DC | PRN
  Administered 2023-11-14: 10 mL

## 2023-11-14 MED ORDER — FENTANYL 12 MCG/HR TD PT72
1.0000 | MEDICATED_PATCH | TRANSDERMAL | 0 refills | Status: DC
  Filled 2023-11-14: qty 10, 30d supply, fill #0

## 2023-11-14 MED ORDER — OXYCODONE HCL 5 MG PO TABS
5.0000 mg | ORAL_TABLET | ORAL | 0 refills | Status: DC | PRN
  Filled 2023-11-14: qty 60, 5d supply, fill #0

## 2023-11-14 MED ORDER — SODIUM CHLORIDE 0.9 % IV SOLN
400.0000 mg/m2 | Freq: Once | INTRAVENOUS | Status: AC
  Administered 2023-11-14: 600 mg via INTRAVENOUS
  Filled 2023-11-14: qty 24

## 2023-11-14 MED ORDER — HEPARIN SOD (PORK) LOCK FLUSH 100 UNIT/ML IV SOLN
500.0000 [IU] | Freq: Once | INTRAVENOUS | Status: AC | PRN
  Administered 2023-11-14: 500 [IU]

## 2023-11-14 MED ORDER — SODIUM CHLORIDE 0.9 % IV SOLN: INTRAVENOUS | Status: DC

## 2023-11-14 NOTE — Progress Notes (Signed)
**Note De-Identified Allison Obfuscation**  Palliative Medicine [facility]  Telephone:([phone] [phone]   Name: Allison [name] Date: 11/14/2023 MRN: [id number]  DOB: 11-17-1950  Patient Care Team: Yolanda Hence, MD as PCP - General (Internal Medicine) Wendie Hamburg, MD as PCP - Cardiology (Cardiology) Retta Caster, MD as Consulting Physician (Radiation Oncology) Pickenpack-Cousar, Giles Labrum, NP as Nurse Practitioner (Nurse Practitioner) Jaye Mettle, RN as Oncology Nurse Navigator (Oncology) Marlene Simas, MD as Consulting Physician (Oncology) Sarrah Cure, DO (Geriatric Medicine) Rosalita Combe, RN as Oncology Nurse Navigator    INTERVAL HISTORY:stage IV non-small cell lung cancer (12/2017), hypertension, CVA, CAD, DVT/PE (on Lovenox ), and GERD.  Palliative ask to see for symptom management and goals of care.  Allison [name] is a 73 y.o. female with   SOCIAL HISTORY:     reports that she has never smoked. She has been exposed to tobacco smoke. She has never used smokeless tobacco. She reports that she does not currently use alcohol. She reports that she does not use drugs.  ADVANCE DIRECTIVES:  MOST and AD on file   CODE STATUS: DNR  PAST MEDICAL HISTORY: Past Medical History:  Diagnosis Date   Anemia    Anxiety    Arthritis    Asthma    exercise induced   Coronary artery disease    Depression    PMH   Dyspnea    GERD (gastroesophageal reflux disease)    Glaucoma    History of radiation therapy 01/05/2021   IMRT right lung  11/24/2020-01/05/2021  Dr Retta Caster   Hypertension    lung ca dx'd 11/2017   right   Malignant pleural effusion    right   Nuclear sclerotic cataract of right eye 02/28/2021   Dr. Devin Foerster, cataract surgery February 2023   PONV (postoperative nausea and vomiting)    Pre-diabetes    Raynaud's disease    Raynaud's disease    Stroke (HCC) 01/2021   balance off, some express aphasia, weakness    ALLERGIES:  is  allergic to hydrocodone -acetaminophen , other, penicillins, lactose, vicodin [hydrocodone -acetaminophen ], bacid, and ipratropium-albuterol .  MEDICATIONS:  Current Outpatient Medications  Medication Sig Dispense Refill   ALPRAZolam  (XANAX ) 0.25 MG tablet Take 1 tablet (0.25 mg total) by mouth 2 (two) times daily as needed for anxiety. May take 2 tablets (0.5 mg total) at bedtime. (Patient taking differently: Take 0.25-0.5 mg by mouth 2 (two) times daily as needed for anxiety or sleep.) 60 tablet 0   ALPRAZolam  (XANAX ) 0.25 MG tablet Take 1 tablet (0.25 mg total) by mouth 3 (three) times daily as needed. 90 tablet 3   amLODipine  (NORVASC ) 5 MG tablet Take 1 tablet (5 mg total) by mouth daily. (Patient not taking: Reported on 09/19/2023) 90 tablet 3   carvedilol  (COREG ) 6.25 MG tablet Take 1 tablet (6.25 mg total) by mouth 2 (two) times daily. 180 tablet 3   dexamethasone  (DECADRON ) 4 MG tablet Take 1 tablet twice a day the day before, day of, and day after treatment (Patient not taking: Reported on 09/07/2023) 40 tablet 2   dorzolamide -timolol  (COSOPT ) 22.3-6.8 MG/ML ophthalmic solution Place 1 drop into both eyes 2 (two) times daily.     doxycycline  (VIBRA -TABS) 100 MG tablet Take 1 tablet (100 mg total) by mouth 2 (two) times daily. 14 tablet 0   enoxaparin  (LOVENOX ) 60 MG/0.6ML injection Inject 0.55 mLs (55 mg total) into the skin every 12 (twelve) hours. 100 mL 1   ezetimibe  (ZETIA ) 10  MG tablet Take 10 mg by mouth daily.     FeFum-FePoly-FA-B Cmp-C-Biot (FOLIVANE-PLUS) CAPS Take 1 capsule by mouth in the morning. 90 capsule 0   fentaNYL  (DURAGESIC ) 12 MCG/HR Place 1 patch onto the skin every 3 (three) days. 10 patch 0   FLUoxetine  (PROZAC ) 20 MG capsule Take 1 capsule (20 mg total) by mouth daily. 90 capsule 3   folic acid  (FOLVITE ) 1 MG tablet TAKE 1 TABLET BY MOUTH EVERY DAY 90 tablet 1   gabapentin  (NEURONTIN ) 250 MG/5ML solution Take 4 mLs (200 mg total) by mouth at bedtime. 470 mL 1    Glycopyrrolate -Formoterol  (BEVESPI  AEROSPHERE) 9-4.8 MCG/ACT AERO Inhale 2 puffs into the lungs 2 (two) times daily. (Patient not taking: Reported on 09/19/2023) 10.7 g 3   HYDROcodone  bit-homatropine (HYCODAN) 5-1.5 MG/5ML syrup Take 5 mLs by mouth every 6 (six) hours as needed for cough. (Patient not taking: Reported on 09/26/2023) 240 mL 0   hyoscyamine  (LEVSIN SL) 0.125 MG SL tablet Place 1-2 tablets (0.125-0.25mg ) under the tongue every 6 (six) hours as needed for cramping. 30 tablet 3   isosorbide  mononitrate (IMDUR ) 30 MG 24 hr tablet Take 1 tablet (30 mg total) by mouth daily. 90 tablet 3   latanoprost  (XALATAN ) 0.005 % ophthalmic solution Place 1 drop into both eyes at bedtime.      ondansetron  (ZOFRAN -ODT) 4 MG disintegrating tablet Take 1 tablet (4 mg total) by mouth every 8 (eight) hours as needed for nausea or vomiting. (Patient taking differently: Take 4 mg by mouth every 8 (eight) hours as needed for nausea or vomiting (dissolve orally).) 30 tablet 1   osimertinib  mesylate (TAGRISSO ) 80 MG tablet Take 1 tablet (80 mg total) by mouth daily. 30 tablet 4   oxyCODONE  (OXY IR/ROXICODONE ) 5 MG immediate release tablet Take 1-2 tablets (5-10 mg total) by mouth every 4 (four) hours as needed for severe pain (pain score 7-10) or breakthrough pain. 60 tablet 0   PEPCID  AC 10 MG tablet Take 10 mg by mouth 2 (two) times daily.     predniSONE  (DELTASONE ) 10 MG tablet Take 1 tablet (10 mg total) by mouth daily with breakfast. 30 tablet 2   prochlorperazine  (COMPAZINE ) 10 MG tablet Take 1 tablet (10 mg total) by mouth every 6 (six) hours as needed. (Patient taking differently: Take 10 mg by mouth every 6 (six) hours as needed for nausea or vomiting.) 30 tablet 2   REPATHA  SURECLICK 140 MG/ML SOAJ Inject 140 mg into the skin every 14 (fourteen) days. 6 mL 3   senna-docusate (SENNA S) 8.6-50 MG tablet Take 2 tablets by mouth at bedtime. 60 tablet 3   Wheat Dextrin (BENEFIBER DRINK MIX) PACK Take 1 packet by  mouth daily.     No current facility-administered medications for this visit.    VITAL SIGNS: LMP  (LMP Unknown)  There were no vitals filed for this visit.  Estimated body mass index is 21.59 kg/m as calculated from the following:   Height as of 10/02/23: 5\' 4"  (1.626 m).   Weight as of an earlier encounter on 11/14/23: 125 lb 12.8 oz (57.1 kg).   PERFORMANCE STATUS (ECOG) : 2 - Symptomatic, <50% confined to bed   Physical Exam General: NAD Cardiovascular: regular rate and rhythm Pulmonary: normal breathing pattern  Extremities: no edema, no joint deformities Skin: no rashes Neurological: AAO x3  IMPRESSION: Discussed the use of AI scribe software for clinical note transcription with the patient, who gave verbal consent to proceed.  History of Present Illness Allison [name] is a 73 year old female who was seen during infusion for symptom management follow-up. Tolerating without difficulty. She is accompanied by her Significant Other, [name]. Doing well overall. Denies concerns of nausea, vomiting, constipation, or diarrhea that is uncontrolled. Reports her bowel movements are regular. She recently refilled her stool softener for bowel management and is satisfied with results daily.   Appetite has improved. She reports a recent weight gain of two pounds and is concerned about maintaining her appetite, especially with upcoming infusions. Feels her appetite decreases around treatment town and rebounds in several days. Current weight is 125lbs up from 123lbs on 5/6.   She experiences sudden, sharp jaw pain that is not associated with eating and can occur upon waking. This morning, she experienced such pain. A dental evaluation ruled out dental issues, and sinusitis was suggested despite the absence of facial pain. She has tried a mouth guard without evidence of teeth grinding. She took Xanax  for anxiety related to the pain and uses Tylenol  for pain management when experiencing this  discomfort.   We discussed Ms. Chamorro's pain at length. Reports her pain is well controlled. Does not require daily use of breakthrough medication. Her current regimen consiste of fentanyl  patch 15mcg and oxycodone  5-10mg  as needed. No adjustments to current regimen.   All questions answered and support provided.   Goals of Care Ms. Bonneau and family continue to remain hopeful for stability with treatment.  Her goal is to continue to treat the treatable allow her every opportunity to continue to thrive while aggressively managing her symptoms.  Her quality of life is most important to her and her family.  I discussed the importance of continued conversation with family and their medical providers regarding overall plan of care and treatment options, ensuring decisions are within the context of the patients values and GOCs. Assessment & Plan Cancer related pain Fentanyl  patch and oxycodone  used for pain control. Pain well controlled. Does not require daily use of breakthrough medication. No adjustments.  - Continue oxycodone  5-10 mg every 4-6 hours as needed. - Fentanyl  12.5 mcg patch for long-acting pain relief, change every three days. -Continue Xanax  as needed for anxiety. -Will continue to closely monitor and adjust regimen as needed  Constipation Much improved with use of Senna.  -Continue Senna-S 2 tablets at bedtime.   Jaw pain Intermittent sharp jaw pain, likely TMJ disorder or sinusitis. Dental causes ruled out. - Recommend acetaminophen  for pain management as advised by Cassie, PA.   Follow-up I will plan to see patient back in 4-6 weeks. Sooner if needed.   Patient expressed understanding and was in agreement with this plan. She also understands that She can call the clinic at any time with any questions, concerns, or complaints.   Any controlled substances utilized were prescribed in the context of palliative care. PDMP has been reviewed.   Visit consisted of counseling  and education dealing with the complex and emotionally intense issues of symptom management and palliative care in the setting of serious and potentially life-threatening illness.  Dellia Ferguson, AGPCNP-BC  Palliative Medicine Team/[facility]

## 2023-11-14 NOTE — Telephone Encounter (Signed)
 Can you call her PCP Dr. Hershal Loron to change her lovenox  to once a day 80 injected once a day. [facility]  LM for Allison [name] at Dr Jenelle Mis office with recommendations for change in Lovenox  dose  Today's office note faxed to Irean Manner with documented recommendations  Confirmation received

## 2023-11-14 NOTE — Patient Instructions (Signed)
 [facility] - A DEPT OF [facility]  Discharge Instructions: Thank you for choosing [facility] to provide your oncology and hematology care.   If you have a lab appointment with the Cancer Center, please go directly to the Cancer Center and check in at the registration area.   Wear comfortable clothing and clothing appropriate for easy access to any Portacath or PICC line.   We strive to give you quality time with your provider. You may need to reschedule your appointment if you arrive late (15 or more minutes).  Arriving late affects you and other patients whose appointments are after yours.  Also, if you miss three or more appointments without notifying the office, you may be dismissed from the clinic at the provider's discretion.      For prescription refill requests, have your pharmacy contact our office and allow 72 hours for refills to be completed.    Today you received the following chemotherapy and/or immunotherapy agents :  Pemetrexed .       To help prevent nausea and vomiting after your treatment, we encourage you to take your nausea medication as directed.  BELOW ARE SYMPTOMS THAT SHOULD BE REPORTED IMMEDIATELY: *FEVER GREATER THAN 100.4 F (38 C) OR HIGHER *CHILLS OR SWEATING *NAUSEA AND VOMITING THAT IS NOT CONTROLLED WITH YOUR NAUSEA MEDICATION *UNUSUAL SHORTNESS OF BREATH *UNUSUAL BRUISING OR BLEEDING *URINARY PROBLEMS (pain or burning when urinating, or frequent urination) *BOWEL PROBLEMS (unusual diarrhea, constipation, pain near the anus) TENDERNESS IN MOUTH AND THROAT WITH OR WITHOUT PRESENCE OF ULCERS (sore throat, sores in mouth, or a toothache) UNUSUAL RASH, SWELLING OR PAIN  UNUSUAL VAGINAL DISCHARGE OR ITCHING   Items with * indicate a potential emergency and should be followed up as soon as possible or go to the Emergency Department if any problems should occur.  Please show the CHEMOTHERAPY ALERT CARD or  IMMUNOTHERAPY ALERT CARD at check-in to the Emergency Department and triage nurse.  Should you have questions after your visit or need to cancel or reschedule your appointment, please contact [facility] - A DEPT OF [facility]  Dept: [phone]  and follow the prompts.  Office hours are 8:00 a.m. to 4:30 p.m. Monday - Friday. Please note that voicemails left after 4:00 p.m. may not be returned until the following business day.  We are closed weekends and major holidays. You have access to a nurse at all times for urgent questions. Please call the main number to the clinic Dept: [phone] and follow the prompts.   For any non-urgent questions, you may also contact your provider using MyChart. We now offer e-Visits for anyone 73 and older to request care online for non-urgent symptoms. For details visit [email or web address].   Also download the MyChart app! Go to the app store, search "MyChart", open the app, select [facility], and log in with your MyChart username and password.

## 2023-11-14 NOTE — Progress Notes (Signed)
 [facility] Spiritual Care Note  Followed up with Allison [name], husband Allison [name], and close friend Allison [name] (from [facility]) in infusion, providing social/emotional support and prayer per request. Allison [name] was in good spirits today, citing encouraging scan results and notable absence of coughing (which had previously interrupted 1-2 sentence conversations).  We plan to follow up through [facility], and Allison [name] knows to reach out as needed/desired, as well.   [address] [name], [place], [facility] Pager [phone] Voicemail [phone]

## 2023-11-15 ENCOUNTER — Other Ambulatory Visit: Payer: Self-pay

## 2023-11-15 ENCOUNTER — Other Ambulatory Visit: Payer: Self-pay | Admitting: Physician Assistant

## 2023-11-15 DIAGNOSIS — C3491 Malignant neoplasm of unspecified part of right bronchus or lung: Secondary | ICD-10-CM

## 2023-11-15 MED ORDER — OSIMERTINIB MESYLATE 80 MG PO TABS: 80.0000 mg | ORAL_TABLET | Freq: Every day | ORAL | 4 refills | Status: DC

## 2023-11-16 ENCOUNTER — Encounter: Payer: Self-pay | Admitting: Internal Medicine

## 2023-11-16 ENCOUNTER — Other Ambulatory Visit: Payer: Self-pay | Admitting: Physician Assistant

## 2023-11-16 DIAGNOSIS — C3491 Malignant neoplasm of unspecified part of right bronchus or lung: Secondary | ICD-10-CM

## 2023-11-20 ENCOUNTER — Other Ambulatory Visit (HOSPITAL_COMMUNITY): Payer: Self-pay

## 2023-11-21 ENCOUNTER — Ambulatory Visit: Admitting: Acute Care

## 2023-11-21 ENCOUNTER — Encounter: Payer: Self-pay | Admitting: Internal Medicine

## 2023-11-21 ENCOUNTER — Other Ambulatory Visit: Payer: Self-pay | Admitting: Physician Assistant

## 2023-11-26 ENCOUNTER — Ambulatory Visit: Admitting: Acute Care

## 2023-12-04 ENCOUNTER — Ambulatory Visit: Admitting: Acute Care

## 2023-12-04 ENCOUNTER — Encounter: Payer: Self-pay | Admitting: Acute Care

## 2023-12-04 VITALS — BP 163/76 | HR 80 | Ht 64.0 in | Wt 126.6 lb

## 2023-12-04 DIAGNOSIS — Z85118 Personal history of other malignant neoplasm of bronchus and lung: Secondary | ICD-10-CM

## 2023-12-04 DIAGNOSIS — R053 Chronic cough: Secondary | ICD-10-CM | POA: Diagnosis not present

## 2023-12-04 DIAGNOSIS — R0600 Dyspnea, unspecified: Secondary | ICD-10-CM

## 2023-12-04 DIAGNOSIS — Z86711 Personal history of pulmonary embolism: Secondary | ICD-10-CM

## 2023-12-04 DIAGNOSIS — Z5111 Encounter for antineoplastic chemotherapy: Secondary | ICD-10-CM

## 2023-12-04 DIAGNOSIS — C3490 Malignant neoplasm of unspecified part of unspecified bronchus or lung: Secondary | ICD-10-CM | POA: Diagnosis not present

## 2023-12-04 DIAGNOSIS — Z8709 Personal history of other diseases of the respiratory system: Secondary | ICD-10-CM

## 2023-12-04 MED ORDER — BEVESPI AEROSPHERE 9-4.8 MCG/ACT IN AERO: 2.0000 | INHALATION_SPRAY | Freq: Two times a day (BID) | RESPIRATORY_TRACT | 3 refills | Status: DC

## 2023-12-04 NOTE — Progress Notes (Signed)
 [name] OFFICE PROGRESS NOTE  Allison Iha, MD [address] [place] [address]  DIAGNOSIS: Stage IV (T2 a,N2, M1a) non-small cell lung cancer, adenocarcinoma diagnosed in July 2019 and presented with right upper lobe lung mass in addition to mediastinal lymphadenopathy as well as bilateral pulmonary nodules and malignant right pleural effusion.   Biomarker Findings Microsatellite status - Cannot Be Determined Tumor Mutational Burden - Cannot Be Determined Genomic Findings For a complete list of the genes assayed, please refer to the Appendix. EGFR exon 19 deletion ([id number]) TP53 Y220C 7 Disease relevant genes with no reportable alterations: KRAS, ALK, BRAF, MET, RET, ERBB2, ROS1    Repeat molecular studies in 2025 showed no new mutations  PRIOR THERAPY: 1) Status post right Pleurx catheter placement by Dr. Fleeta Ochoa for drainage of malignant right pleural effusion. 2) palliative radiotherapy to the enlarging right upper lobe lung mass and mediastinum under the care of Dr. Shannon expected to be completed on January 05, 2021. 3) Systemic chemotherapy with carboplatin  for AUC of 5 and Alimta  500 Mg/M2 every 3 weeks.  First dose 03/17/2021.  The patient will also continue her current treatment with Tagrisso  80 mg p.o. daily.  She is status post 26 cycles. Starting from cycle #7, she will be on Alimta  only 400 mg/m2.  Her treatment with Alimta  has been on hold for several months now.   CURRENT THERAPY: Tagrisso  80 mg p.o. daily.  First dose was given on 01/29/2018.  Status post 68.5 months of treatment.  IV chemotherapy with alimta  400 mg/m2 IV every 3 weeks to start around 09/11/23. Status post 4 cycles.   INTERVAL HISTORY: [name] 73 y.o. female returns to the clinic today for a follow-up visit accompanied by her significant other. In summary, the patient was found to have disease progression a few months ago.  Therefore, she restarted  systemic chemotherapy with single agent Alimta  dose reduced to 400 mg/m for tolerability.  She is status post 4 cycles of treatment and tolerated it well overall. Her most recent CT scan (we discussed at her last appointment) showed a positive response to treatment. Symptomatically, she had significant improvement in her cough since starting treatment. Before treatment, she hardly could talk without coughing. She is on 10 mg of prednisone .   He saw pulmonary medicine recently.  They discussed possible referral to pulmonary rehab and her inhaler management.  They also discussed possibly considering reducing her dose of prednisone  to 7.5.  The patient has some questions about her vision.  She reports some worsening of her eyesight and she has an upcoming appointment with her eye doctor next week on 12/19/2023.  She is wondering if this is secondary to her prednisone  or Xanax .  Otherwise she denies any fever, chills, or unexplained weight loss.  She continues to have similar dyspnea on exertion.  Her cough is significantly improved.  Denies any hemoptysis.  She denies any nausea, vomiting, or constipation.  She intermittently may get diarrhea from her Tagrisso  but nothing that she cannot manage.  She denies any rashes or skin changes.  She denies any headache or visual changes.  Her Lovenox  injections were reduced to once a day which has helped reduce the bruising on her abdomen.  She denies any abnormal bleeding or bruising or any signs of blood clot at this time including extremity swelling.   She is here for evaluation and repeat blood work before undergoing cycle #5.     MEDICAL HISTORY:  Past Medical History:  Diagnosis Date   Anemia    Anxiety    Arthritis    Asthma    exercise induced   Coronary artery disease    Depression    PMH   Dyspnea    GERD (gastroesophageal reflux disease)    Glaucoma    History of radiation therapy 01/05/2021   IMRT right lung  11/24/2020-01/05/2021  Dr Lynwood Nasuti   Hypertension    lung ca dx'd 11/2017   right   Malignant pleural effusion    right   Nuclear sclerotic cataract of right eye 02/28/2021   Dr. Lonni Gaudy, cataract surgery February 2023   PONV (postoperative nausea and vomiting)    Pre-diabetes    Raynaud's disease    Raynaud's disease    Stroke (HCC) 01/2021   balance off, some express aphasia, weakness    ALLERGIES:  is allergic to hydrocodone -acetaminophen , other, penicillins, lactose, vicodin [hydrocodone -acetaminophen ], bacid, and ipratropium-albuterol .  MEDICATIONS:  Current Outpatient Medications  Medication Sig Dispense Refill   ALPRAZolam  (XANAX ) 0.25 MG tablet Take 1 tablet (0.25 mg total) by mouth 2 (two) times daily as needed for anxiety. May take 2 tablets (0.5 mg total) at bedtime. (Patient taking differently: Take 0.25-0.5 mg by mouth 2 (two) times daily as needed for anxiety or sleep.) 60 tablet 0   ALPRAZolam  (XANAX ) 0.5 MG tablet Take 1 tablet (0.5 mg total) by mouth 3 (three) times daily as needed. 90 tablet 3   amLODipine  (NORVASC ) 5 MG tablet Take 1 tablet (5 mg total) by mouth daily. (Patient not taking: Reported on 12/04/2023) 90 tablet 3   carvedilol  (COREG ) 6.25 MG tablet Take 1 tablet (6.25 mg total) by mouth 2 (two) times daily. 180 tablet 3   dexamethasone  (DECADRON ) 4 MG tablet Take 1 tablet twice a day the day before, day of, and day after treatment (Patient not taking: Reported on 12/04/2023) 40 tablet 2   dorzolamide -timolol  (COSOPT ) 22.3-6.8 MG/ML ophthalmic solution Place 1 drop into both eyes 2 (two) times daily.     doxycycline  (VIBRA -TABS) 100 MG tablet Take 1 tablet (100 mg total) by mouth 2 (two) times daily. 14 tablet 0   enoxaparin  (LOVENOX ) 60 MG/0.6ML injection Inject 0.55 mLs (55 mg total) into the skin every 12 (twelve) hours. 100 mL 1   ezetimibe  (ZETIA ) 10 MG tablet Take 10 mg by mouth daily.     FeFum-FePoly-FA-B Cmp-C-Biot (FOLIVANE-PLUS) CAPS Take 1 capsule by mouth in the  morning. 90 capsule 0   fentaNYL  (DURAGESIC ) 12 MCG/HR Place 1 patch onto the skin every 3 (three) days. 10 patch 0   FLUoxetine  (PROZAC ) 20 MG capsule Take 1 capsule (20 mg total) by mouth daily. 90 capsule 3   folic acid  (FOLVITE ) 1 MG tablet TAKE 1 TABLET BY MOUTH EVERY DAY 90 tablet 1   gabapentin  (NEURONTIN ) 250 MG/5ML solution Take 4 mLs (200 mg total) by mouth at bedtime. 470 mL 1   Glycopyrrolate -Formoterol  (BEVESPI  AEROSPHERE) 9-4.8 MCG/ACT AERO Inhale 2 puffs into the lungs 2 (two) times daily. 10.7 g 3   Glycopyrrolate -Formoterol  (BEVESPI  AEROSPHERE) 9-4.8 MCG/ACT AERO Inhale 2 puffs into the lungs 2 (two) times daily. 3 each 3   HYDROcodone  bit-homatropine (HYCODAN) 5-1.5 MG/5ML syrup Take 5 mLs by mouth every 6 (six) hours as needed for cough. 240 mL 0   hyoscyamine  (LEVSIN  SL) 0.125 MG SL tablet Place 1-2 tablets (0.125-0.25mg ) under the tongue every 6 (six) hours as needed for cramping. 30 tablet 3  isosorbide  mononitrate (IMDUR ) 30 MG 24 hr tablet Take 1 tablet (30 mg total) by mouth daily. 90 tablet 3   latanoprost  (XALATAN ) 0.005 % ophthalmic solution Place 1 drop into both eyes at bedtime.      ondansetron  (ZOFRAN -ODT) 4 MG disintegrating tablet Take 1 tablet (4 mg total) by mouth every 8 (eight) hours as needed for nausea or vomiting. 30 tablet 1   osimertinib  mesylate (TAGRISSO ) 80 MG tablet Take 1 tablet (80 mg total) by mouth daily. 30 tablet 4   oxyCODONE  (OXY IR/ROXICODONE ) 5 MG immediate release tablet Take 1-2 tablets (5-10 mg total) by mouth every 4 (four) hours as needed for severe pain (pain score 7-10) or breakthrough pain. 60 tablet 0   PEPCID  AC 10 MG tablet Take 10 mg by mouth 2 (two) times daily.     predniSONE  (DELTASONE ) 10 MG tablet Take 1 tablet (10 mg total) by mouth daily with breakfast. 30 tablet 2   prochlorperazine  (COMPAZINE ) 10 MG tablet Take 1 tablet (10 mg total) by mouth every 6 (six) hours as needed. 30 tablet 2   REPATHA  SURECLICK 140 MG/ML SOAJ  Inject 140 mg into the skin every 14 (fourteen) days. 6 mL 3   senna-docusate (SENNA S) 8.6-50 MG tablet Take 2 tablets by mouth at bedtime. 60 tablet 3   Wheat Dextrin (BENEFIBER DRINK MIX) PACK Take 1 packet by mouth daily.     No current facility-administered medications for this visit.    SURGICAL HISTORY:  Past Surgical History:  Procedure Laterality Date   ABDOMINAL HYSTERECTOMY     partial   BRONCHIAL BIOPSY  04/21/2021   Procedure: BRONCHIAL BIOPSIES;  Surgeon: Brenna Adine CROME, DO;  Location: [facility];  Service: Pulmonary;;   BRONCHIAL BRUSHINGS  04/21/2021   Procedure: BRONCHIAL BRUSHINGS;  Surgeon: Brenna Adine CROME, DO;  Location: [facility];  Service: Pulmonary;;   BRONCHIAL NEEDLE ASPIRATION BIOPSY  04/21/2021   Procedure: BRONCHIAL NEEDLE ASPIRATION BIOPSIES;  Surgeon: Brenna Adine CROME, DO;  Location: [facility];  Service: Pulmonary;;   CHEST TUBE INSERTION Right 01/01/2018   Procedure: INSERTION PLEURAL DRAINAGE CATHETER;  Surgeon: Fleeta Hanford Coy, MD;  Location[facility] OR;  Service: Thoracic;  Laterality: Right;   CHEST TUBE INSERTION  04/21/2021   Procedure: CHEST TUBE INSERTION;  Surgeon: Brenna Adine CROME, DO;  Location: [facility];  Service: Pulmonary;;   COLONOSCOPY     CORONARY STENT INTERVENTION N/A 06/16/2019   Procedure: CORONARY STENT INTERVENTION;  Surgeon: Dann Candyce RAMAN, MD;  Location: [facility];  Service: Cardiovascular;  Laterality: N/A;   DILATION AND CURETTAGE OF UTERUS     EYE SURGERY     due to Glaucoma   IR IMAGING GUIDED PORT INSERTION  03/22/2021   IR PORT REPAIR CENTRAL VENOUS ACCESS DEVICE  04/15/2021   LEFT HEART CATH AND CORONARY ANGIOGRAPHY N/A 06/16/2019   Procedure: LEFT HEART CATH AND CORONARY ANGIOGRAPHY;  Surgeon: Dann Candyce RAMAN, MD;  Location: [name];  Service: Cardiovascular;  Laterality: N/A;   REMOVAL OF PLEURAL DRAINAGE CATHETER Right 11/07/2018   Procedure: REMOVAL OF PLEURAL DRAINAGE CATHETER;   Surgeon: Fleeta Hanford Coy, MD;  Location: [facility] OR;  Service: Thoracic;  Laterality: Right;   ROTATOR CUFF REPAIR     TUBAL LIGATION     VIDEO BRONCHOSCOPY WITH ENDOBRONCHIAL NAVIGATION Bilateral 04/21/2021   Procedure: VIDEO BRONCHOSCOPY WITH ENDOBRONCHIAL NAVIGATION;  Surgeon: Brenna Adine CROME, DO;  Location: [facility];  Service: Pulmonary;  Laterality: Bilateral;  ION  WISDOM TOOTH EXTRACTION      REVIEW OF SYSTEMS:   Constitutional: Positive for fatigue. Negative for chills and fever.  HENT:  Negative for mouth sores, nosebleeds, sore throat and trouble swallowing.   Eyes: Negative for eye problems and icterus.  Respiratory: Positive fors significantly improved cough and persistent shortness of breath with exertion. Negative for hemoptysis and wheezing.   Cardiovascular: Positive for improving right rib discomfort. Negative for leg swelling.  Gastrointestinal: Occasional mild diarrhea. Negative for abdominal pain, nausea, and vomiting.  Genitourinary: Negative for bladder incontinence, difficulty urinating, dysuria, frequency and hematuria.   Musculoskeletal: Negative for gait problem, neck pain and neck stiffness.  Skin: Negative for itching and rash.  Neurological: Negative for dizziness, extremity weakness, gait problem, headaches, light-headedness and seizures.  Hematological: Negative for adenopathy. Does not bruise/bleed easily.  Psychiatric/Behavioral: Negative for confusion, depression and sleep disturbance. The patient is not nervous/anxious.     PHYSICAL EXAMINATION:  Blood pressure 136/68, pulse 78, temperature 98.3 F (36.8 C), temperature source Temporal, resp. rate 16, weight 125 lb (56.7 kg), SpO2 100%.  ECOG PERFORMANCE STATUS: 1  Physical Exam  Constitutional: Oriented to person, place, and time and thin appearing female and in no distress.   HENT:  Head: Normocephalic and atraumatic.  Mouth/Throat: Oropharynx is clear and moist. No oropharyngeal exudate.  Eyes:  Conjunctivae are normal. Right eye exhibits no discharge. Left eye exhibits no discharge. No scleral icterus.  Neck: Normal range of motion. Neck supple.  Cardiovascular: Normal rate, regular rhythm, normal heart sounds and intact distal pulses.   Pulmonary/Chest: Effort normal and breath sounds clear to auscultation. No respiratory distress. No wheezes. No rales.  Abdominal: Soft. Bowel sounds are normal. Exhibits no distension and no mass. There is no tenderness.  Musculoskeletal: Normal range of motion. Exhibits no edema.  Lymphadenopathy:    No cervical adenopathy.  Neurological: Alert and oriented to person, place, and time. Exhibits muscle wasting. Gait normal. Coordination normal. Uses a cane for ambulation.  Skin: Skin is warm and dry. No rash noted. Not diaphoretic. No erythema. No pallor.  Psychiatric: Mood, memory and judgment normal.  Vitals reviewed.  LABORATORY DATA: Lab Results  Component Value Date   WBC 10.1 12/10/2023   HGB 11.9 (L) 12/10/2023   HCT 36.7 12/10/2023   MCV 85.3 12/10/2023   PLT 200 12/10/2023      Chemistry      Component Value Date/Time   NA 138 11/14/2023 0741   NA 140 03/04/2021 1516   K 3.7 11/14/2023 0741   CL 107 11/14/2023 0741   CO2 24 11/14/2023 0741   BUN 17 11/14/2023 0741   BUN 21 03/04/2021 1516   CREATININE 1.19 (H) 11/14/2023 0741      Component Value Date/Time   CALCIUM  9.1 11/14/2023 0741   ALKPHOS 48 11/14/2023 0741   AST 22 11/14/2023 0741   ALT 32 11/14/2023 0741   BILITOT 0.3 11/14/2023 0741       RADIOGRAPHIC STUDIES:  No results found.    ASSESSMENT/PLAN:  This is a very pleasant 73 year old never smoker African-American female diagnosed with stage IV non-small cell lung cancer, adenocarcinoma.  She was positive for an EGFR mutation with deletion in exon 19.  She was diagnosed in July 2019 and presented with right upper lobe lung mass in addition to mediastinal lymphadenopathy as well as bilateral pulmonary  nodules and malignant right pleural effusion.   The patient was started on treatment with Tagrisso  80 mg p.o. daily status post  39 months of treatment. Started on 01/29/2018.   In April 2022, she showed evidence of disease progression with interval progression of the right apical lung mass in addition to progression of mediastinal lymphadenopathy concerning for worsening of her disease. She had repeat Guardant 360 molecular studies which did not show evidence of new resistant mutations.  Therefore, she was referred to radiation oncology and completed palliative radiotherapy to the enlarging right upper lobe lung mass and mediastinum under the care of Dr. Shannon.  This was completed in July 2022.    The patient underwent a repeat bronchoscopy and biopsy for repeat molecular testing.  The sample from the left and right lung biopsy was negative for malignancy. This was performed in November 2022.    Unfortunately, the patient  was found to have evidence of disease progression.  Therefore, Dr. Sherrod started the patient on systemic chemotherapy with carboplatin  for AUC of 5 and Alimta  500 mg per metered squared.  She is status post 23 cycles and she tolerated it fairly well despite her ongoing issues with fatigue, chest tightness, cough, and weight loss.  Alimta  was reduced to 400 mg per metered squared due to renal insufficiency.  She is not a good candidate for Avastin due to her recent CVA in August 2022.  She also is continuing to take Tagrisso  as it is protective against progressive metastatic disease to the brain. Starting from cycle #7,  she started maintenance single agent alimta . Her treatment has been on hold since March 2024 due to side effects of treatment.    Of note, the patient saw Dr. Valentino from [facility] for second opinion in November 2022.  They discussed if she progresses on chemotherapy that there may be upcoming options. He discussed other treatment options which may be  available in 2023 including patritumab deruxtcan and lazertinib/amivantmab.    Her most recent restaging CT scan showed disease progression with new and increasing septal thickening and perilymphatic nodularity throughout the right hemithorax suspicious for lymphangitic spread of tumor.  A PET scan to confirm this.   Therefore, at her appointment on 08/23/2023 we discussed the options.  She had repeat molecular studies performed which did not show any new actionable mutations.  Therefore the plan will be to proceed with single agent Alimta  IV every 3 weeks as scheduled.  The alternative options that we discussed was adding carboplatin  to the Alimta .  We also talked about amivantamab and lazertinib, but the patient and Dr. Sherrod have concerns about quality of life and side effect profile, although this likely would be effective   The patient opted to proceed on single agent chemotherapy with Alimta  400 mg/m.  She started this on 09/11/2023.  She tolerated this fairly well. She is status post 4 cycles of treatment.    She looks much improved since she starting treatment.     Labs were reviewed.  Recommend that she proceed with cycle 5 today scheduled as long as her CMP allows.     We will see her back for follow-up visit in 3 weeks for evaluation review blood work before undergoing cycle #6     She will continue taking prednisone  10 mg daily.  I did let her know that it may not be a bad idea to reduce her dose to 7.5 mg since her cough is improved to be on the lowest effective dose of prednisone .    She will continue taking Hycodan for cough, she is not needing this as frequently.  She will continue her folic acid .  She will continue taking her Tagrisso  for neuroprotection.  She will continue taking a PPI for heartburn.    She will continue to follow with palliative care for pain management.     She will continue lovenox  for her history of recurrent DVTs.  He is currently taking this once a  day.     Pain management Pain improved with daily oxycodone  and effective fentanyl  patch.  - Continue oxycodone  as needed. - Continue fentanyl  patch every three days.   Shortness of breath Exertional dyspnea persists but manageable. Increased activity tolerance noted. - Encourage pacing activities to manage symptoms. -Pulmonary medicine is considering her for repeat pulmonary rehab per their note.    Chronic cough (improved) Cough controlled with Bevespi  and occasional hycodan. No recent exacerbations. -She is coughing significantly less - Continue Bevespi  for cough management. - Use hydrocodone  as needed for cough. She has not needed this in about 1 month.     Anxiety Anxiety managed with nightly Xanax .   The patient was advised to call immediately if she has any concerning symptoms in the interval. The patient voices understanding of current disease status and treatment options and is in agreement with the current care plan. All questions were answered. The patient knows to call the clinic with any problems, questions or concerns. We can certainly see the patient much sooner if necessary   No orders of the defined types were placed in this encounter.    The total time spent in the appointment was 20-29 minutes  Amedio Bowlby L Jeramie Scogin, PA-C 12/10/23

## 2023-12-04 NOTE — Patient Instructions (Addendum)
 It is good to see you today/ You look great. Continue using your Bevespi  as 2 puffs twice daily Rinse mouth after use. Continue using the spacer as you have been doing.  Continue prednisone  10 mg daily. We can discuss weaning at follow up.  Call if you need us  for anything at all.  It is so great to see you looking so well. Please contact office for sooner follow up if symptoms do not improve or worsen or seek emergency care

## 2023-12-04 NOTE — Progress Notes (Addendum)
 History of Present Illness Allison [name] is a 73 y.o. female former smoker with stage 4 adenocarcinoma of the right lung, (12/2017), treated with  chemo, and radiation therapy, hypertension, CVA, CAD, DVT/PE (on Lovenox ),iron  deficiency anemia,  and GERD. She has been  followed by Dr. Thelda Finney. She will be followed by  Dr. Baldwin Levee moving forward.   Synopsis History of Stage IV (T2 a,N2, M1a) non-small cell lung cancer, adenocarcinoma diagnosed in July 2019 and presented with right upper lobe lung mass in addition to mediastinal lymphadenopathy as well as bilateral pulmonary nodules and malignant right pleural effusion.  Pt. Has history of a slow to resolve  bronchitis that started 07/09/2023.  She experienced a persistent cough and progressive weakness since January 2025.She also had pleural effusions which required a pleurex catheter.  She is now  on low-dose prednisone  for her cough and appetite( started 09/10/2023) , and we switched her inhaler to Bevespi , and added a spacer, which she feels has been the most significant reason for her improvement.   PRIOR THERAPY: 1) Status post right Pleurx catheter placement by Dr. Matt Song for drainage of malignant right pleural effusion. 2) palliative radiotherapy to the enlarging right upper lobe lung mass and mediastinum under the care of Dr. Eloise Hake expected to be completed on January 05, 2021. 3) Systemic chemotherapy with carboplatin  for AUC of 5 and Alimta  500 Mg/M2 every 3 weeks.  First dose 03/17/2021.  The patient will also continue her current treatment with Tagrisso  80 mg p.o. daily.  She is status post 26 cycles. Starting from cycle #7, she will be on Alimta  only 400 mg/m2.  Her treatment with Alimta  has been on hold for several months now.    Started Chemo  infusions on 09/11/2023 for disease progression, which she is tolerating well. .    She is here today for 2 month follow up to ensure she is doing well.  FENO was 18 ppb on 08/06/2023. Eosinophils on  07/25/2023 were 100, last prednisone  taper was completed 2/15.   She was seen in the office 08/2023 and started on prednisone  10 mg daily for her continued cough. She was given the option of trying a  PPI, which she has not done, but she did start pepcid .  CURRENT CANCER THERAPY: Tagrisso  80 mg p.o. daily.  First dose was given on 01/29/2018.  Status post 68.5 months of treatment.  IV chemotherapy with alimta  400 mg/m2 IV every 3 weeks  started around 09/11/23.  Pt. Has consented to use of [employer]bridge soft wear to help capture the content of this OV     12/04/2023   Pt presents for follow up. She states she has been doing well.  Her cough has completely  resolved with the use of the Bevespi  and prednisone . She uses occasional Hycodan as needed.  She is using her Bevespi  daily with significant relief of shortness of breath. We have submitted paperwork to see if we can get the Bevespi  at a discounted rate through the manufacturer.  It is currently costing her about $100 per inhaler.    She is taking prednisone  10 mg daily. She is having chemo every 3 weeks, as it is working to prevent progression.  She states she is tolerating this for the best part however about a week after her infusion she says she is fatigued.  She is on Tagrisso . She is tolerating it well. Her weight is up to 126.6 pounds. Her strength is better. She has been working with  PT, and OT.[name] She has graduated from OT. She looks great.  She is very pleased with the fact that she is doing better.We will refer to pulmonary rehab for continued support and strengthening of respiratory muscles.  She is currently on Lovenox  due to her upper extremity DVTs.  Due to  bruising related to her injection sites I believe Dr. Liam Redhead is going to move her to once a day 80 mg per 0.8 mL, instead of her twice daily current dosing.  Most recent CT chest shows interval improvement which is encouraging. She is undergoing surveillance CT's  of the Chest through  oncology. She is getting pain management through palliative care.Anxiety management with nightly xanax .    Imaging Results  Most recent Ct chest, shows interval improvement in very dense perihilar consolidation and nodularity throughout the right lung. Persistent, diffuse pleural thickening, interlobular septal thickening, and small right pleural effusion. Resolution of a previously seen small left pleural effusion. Minimal interlobular septal thickening throughout the left hemithorax as well as minimal, persistent pleural thickening, diminished. Decreased size of a nodule of the dependent left lung base measuring 1.0 x 0.8 cm, previously 1.4 x 1.4 cm. Treated soft tissue about the right hilum not significantly changed, without discretely visible enlarged lymph nodes on noncontrast CT. Constellation of findings consistent with treatment response of right lung malignancy with associated lymphangitic carcinomatosis and pleural metastatic disease. No noncontrast evidence of lymphadenopathy or metastatic disease in the abdomen or pelvis. Coronary artery disease. Aortic atherosclerosis.      Latest Ref Rng & Units 11/14/2023    7:41 AM 10/23/2023    2:36 PM 10/02/2023    7:46 AM  CBC  WBC 4.0 - 10.5 K/uL 7.1  9.8  5.5   Hemoglobin 12.0 - 15.0 g/dL 86.5  78.4  69.6   Hematocrit 36.0 - 46.0 % 34.8  33.6  33.6   Platelets 150 - 400 K/uL 226  256  291        Latest Ref Rng & Units 11/14/2023    7:41 AM 10/23/2023    2:36 PM 10/02/2023    7:46 AM  BMP  Glucose 70 - 99 mg/dL 295  284  132   BUN 8 - 23 mg/dL 17  19  21    Creatinine 0.44 - 1.00 mg/dL 4.40  1.02  7.25   Sodium 135 - 145 mmol/L 138  138  139   Potassium 3.5 - 5.1 mmol/L 3.7  3.6  4.0   Chloride 98 - 111 mmol/L 107  107  106   CO2 22 - 32 mmol/L 24  26  27    Calcium  8.9 - 10.3 mg/dL 9.1  9.0  9.2     BNP    Component Value Date/Time   BNP 35.4 02/07/2021 1640    ProBNP No results found for: PROBNP  PFT No  results found for: FEV1PRE, FEV1POST, FVCPRE, FVCPOST, TLC, DLCOUNC, PREFEV1FVCRT, PSTFEV1FVCRT  CT CHEST ABDOMEN PELVIS WO CONTRAST Result Date: 11/06/2023 CLINICAL DATA:  Metastatic lung cancer restaging * Tracking Code: BO * EXAM: CT CHEST, ABDOMEN AND PELVIS WITHOUT CONTRAST TECHNIQUE: Multidetector CT imaging of the chest, abdomen and pelvis was performed following the standard protocol without IV contrast. RADIATION DOSE REDUCTION: This exam was performed according to the departmental dose-optimization program which includes automated exposure control, adjustment of the mA and/or kV according to patient size and/or use of iterative reconstruction technique. COMPARISON:  CT chest, 09/07/2023, PET-CT, 08/13/2023 FINDINGS: CT CHEST FINDINGS Cardiovascular: Right chest port  catheter. Aortic atherosclerosis. Normal heart size. Left and right coronary artery calcifications and stents. No pericardial effusion. Mediastinum/Nodes: Treated soft tissue about the right hilum not significantly changed, without discretely visible enlarged lymph nodes on noncontrast CT (series 2, image 20). Thyroid  gland, trachea, and esophagus demonstrate no significant findings. Lungs/Pleura: When compared to prior examination of the chest dated 09/07/2023, significant interval improvement in very dense perihilar consolidation and nodularity throughout the lung. Persistent, diffuse pleural thickening, interlobular septal thickening, and small right pleural effusion. Resolution of a previously seen small left pleural effusion. Minimal interlobular septal thickening throughout the left hemithorax as well as minimal, persistent pleural thickening, diminished. Decreased size of a nodule of the dependent left lung base measuring 1.0 x 0.8 cm, previously 1.4 x 1.4 cm (series 4, image 113). Musculoskeletal: No chest wall abnormality. No acute osseous findings. CT ABDOMEN PELVIS FINDINGS Hepatobiliary: No solid liver abnormality  is seen. No gallstones, gallbladder wall thickening, or biliary dilatation. Pancreas: Unremarkable. No pancreatic ductal dilatation or surrounding inflammatory changes. Spleen: Normal in size without significant abnormality. Adrenals/Urinary Tract: Adrenal glands are unremarkable. Kidneys are normal, without renal calculi, solid lesion, or hydronephrosis. Bladder is unremarkable. Stomach/Bowel: Stomach is within normal limits. Appendix appears normal. No evidence of bowel wall thickening, distention, or inflammatory changes. Sigmoid diverticulosis. Vascular/Lymphatic: Aortic atherosclerosis. No enlarged abdominal or pelvic lymph nodes. Reproductive: Status post hysterectomy. Other: No abdominal wall hernia or abnormality. Fluid in the low right hemipelvis, which on prior examination dated 08/13/2023 appear to be within bowel loops (series 2, image 96). Musculoskeletal: No acute osseous findings. IMPRESSION: 1. When compared to prior examination of the chest dated 09/07/2023, significant interval improvement in very dense perihilar consolidation and nodularity throughout the right lung. Persistent, diffuse pleural thickening, interlobular septal thickening, and small right pleural effusion. 2. Resolution of a previously seen small left pleural effusion. Minimal interlobular septal thickening throughout the left hemithorax as well as minimal, persistent pleural thickening, diminished. 3. Decreased size of a nodule of the dependent left lung base measuring 1.0 x 0.8 cm, previously 1.4 x 1.4 cm. 4. Treated soft tissue about the right hilum not significantly changed, without discretely visible enlarged lymph nodes on noncontrast CT. 5. Constellation of findings consistent with treatment response of right lung malignancy with associated lymphangitic carcinomatosis and pleural metastatic disease. 6. No noncontrast evidence of lymphadenopathy or metastatic disease in the abdomen or pelvis. 7. Coronary artery disease. 8.  Aortic atherosclerosis. Aortic Atherosclerosis (ICD10-I70.0). Electronically Signed   By: Fredricka Jenny M.D.   On: 11/06/2023 11:15     Past medical hx Past Medical History:  Diagnosis Date   Anemia    Anxiety    Arthritis    Asthma    exercise induced   Coronary artery disease    Depression    PMH   Dyspnea    GERD (gastroesophageal reflux disease)    Glaucoma    History of radiation therapy 01/05/2021   IMRT right lung  11/24/2020-01/05/2021  Dr Retta Caster   Hypertension    lung ca dx'd 11/2017   right   Malignant pleural effusion    right   Nuclear sclerotic cataract of right eye 02/28/2021   Dr. Devin Foerster, cataract surgery February 2023   PONV (postoperative nausea and vomiting)    Pre-diabetes    Raynaud's disease    Raynaud's disease    Stroke (HCC) 01/2021   balance off, some express aphasia, weakness     Social History   Tobacco Use  Smoking status: Never    Passive exposure: Past   Smokeless tobacco: Never  Vaping Use   Vaping status: Never Used  Substance Use Topics   Alcohol use: Not Currently    Comment: up to 3 drinks per week   Drug use: No    Comment: CBD oil     Ms.Delio reports that she has never smoked. She has been exposed to tobacco smoke. She has never used smokeless tobacco. She reports that she does not currently use alcohol. She reports that she does not use drugs.  Tobacco Cessation: Never smoker    Past surgical hx, Family hx, Social hx all reviewed.  Current Outpatient Medications on File Prior to Visit  Medication Sig   ALPRAZolam  (XANAX ) 0.25 MG tablet Take 1 tablet (0.25 mg total) by mouth 2 (two) times daily as needed for anxiety. May take 2 tablets (0.5 mg total) at bedtime. (Patient taking differently: Take 0.25-0.5 mg by mouth 2 (two) times daily as needed for anxiety or sleep.)   carvedilol  (COREG ) 6.25 MG tablet Take 1 tablet (6.25 mg total) by mouth 2 (two) times daily.   dorzolamide -timolol  (COSOPT ) 22.3-6.8  MG/ML ophthalmic solution Place 1 drop into both eyes 2 (two) times daily.   doxycycline  (VIBRA -TABS) 100 MG tablet Take 1 tablet (100 mg total) by mouth 2 (two) times daily.   enoxaparin  (LOVENOX ) 60 MG/0.6ML injection Inject 0.55 mLs (55 mg total) into the skin every 12 (twelve) hours.   ezetimibe  (ZETIA ) 10 MG tablet Take 10 mg by mouth daily.   FeFum-FePoly-FA-B Cmp-C-Biot (FOLIVANE-PLUS) CAPS Take 1 capsule by mouth in the morning.   fentaNYL  (DURAGESIC ) 12 MCG/HR Place 1 patch onto the skin every 3 (three) days.   FLUoxetine  (PROZAC ) 20 MG capsule Take 1 capsule (20 mg total) by mouth daily.   folic acid  (FOLVITE ) 1 MG tablet TAKE 1 TABLET BY MOUTH EVERY DAY   gabapentin  (NEURONTIN ) 250 MG/5ML solution Take 4 mLs (200 mg total) by mouth at bedtime.   Glycopyrrolate -Formoterol  (BEVESPI  AEROSPHERE) 9-4.8 MCG/ACT AERO Inhale 2 puffs into the lungs 2 (two) times daily.   HYDROcodone  bit-homatropine (HYCODAN) 5-1.5 MG/5ML syrup Take 5 mLs by mouth every 6 (six) hours as needed for cough.   hyoscyamine  (LEVSIN  SL) 0.125 MG SL tablet Place 1-2 tablets (0.125-0.25mg ) under the tongue every 6 (six) hours as needed for cramping.   isosorbide  mononitrate (IMDUR ) 30 MG 24 hr tablet Take 1 tablet (30 mg total) by mouth daily.   latanoprost  (XALATAN ) 0.005 % ophthalmic solution Place 1 drop into both eyes at bedtime.    ondansetron  (ZOFRAN -ODT) 4 MG disintegrating tablet Take 1 tablet (4 mg total) by mouth every 8 (eight) hours as needed for nausea or vomiting.   osimertinib  mesylate (TAGRISSO ) 80 MG tablet Take 1 tablet (80 mg total) by mouth daily.   oxyCODONE  (OXY IR/ROXICODONE ) 5 MG immediate release tablet Take 1-2 tablets (5-10 mg total) by mouth every 4 (four) hours as needed for severe pain (pain score 7-10) or breakthrough pain.   PEPCID  AC 10 MG tablet Take 10 mg by mouth 2 (two) times daily.   predniSONE  (DELTASONE ) 10 MG tablet Take 1 tablet (10 mg total) by mouth daily with breakfast.    prochlorperazine  (COMPAZINE ) 10 MG tablet Take 1 tablet (10 mg total) by mouth every 6 (six) hours as needed.   REPATHA  SURECLICK 140 MG/ML SOAJ Inject 140 mg into the skin every 14 (fourteen) days.   senna-docusate (SENNA S) 8.6-50 MG tablet Take 2  tablets by mouth at bedtime.   Wheat Dextrin (BENEFIBER DRINK MIX) PACK Take 1 packet by mouth daily.   amLODipine  (NORVASC ) 5 MG tablet Take 1 tablet (5 mg total) by mouth daily. (Patient not taking: Reported on 12/04/2023)   dexamethasone  (DECADRON ) 4 MG tablet Take 1 tablet twice a day the day before, day of, and day after treatment (Patient not taking: Reported on 12/04/2023)   No current facility-administered medications on file prior to visit.     Allergies  Allergen Reactions   Hydrocodone -Acetaminophen  Other (See Comments)    Irregular heart rate Can tolerate Hycodan in 2025.   Other Other (See Comments)    No cold water = triggers coughing!!  Unnamed glaucoma eye drop - turned eyes dark   Penicillins Other (See Comments)    SYNCOPE PATIENT HAS HAD A PCN REACTION WITH IMMEDIATE RASH, FACIAL/TONGUE/THROAT SWELLING, SOB, OR LIGHTHEADEDNESS WITH HYPOTENSION: Yes Has patient had a PCN reaction causing severe rash involving mucus membranes or skin necrosis: No Has patient had a PCN reaction that required hospitalization: No Has patient had a PCN reaction occurring within the last 10 years: No   Lactose Other (See Comments)    Allergy to all dairy products Abdominal cramping   Vicodin [Hydrocodone -Acetaminophen ] Other (See Comments)    Sped up heart and breathing (tolerates Hycodan in 2025)   Bacid Nausea And Vomiting   Ipratropium-Albuterol  Cough    Review Of Systems:  Constitutional:   No  weight loss, night sweats,  Fevers, chills,+ fatigue, after chemo or  lassitude.  HEENT:   No headaches,  Difficulty swallowing,  Tooth/dental problems, or  Sore throat,                No sneezing, itching, ear ache, nasal congestion, post nasal  drip,   CV:  No chest pain,  Orthopnea, PND, swelling in lower extremities, anasarca, dizziness, palpitations, syncope.   GI  No heartburn, indigestion, abdominal pain, nausea, vomiting, diarrhea, change in bowel habits, loss of appetite, bloody stools.   Resp: + shortness of breath with exertion not  at rest.  No excess mucus, no productive cough,  No non-productive cough,  No coughing up of blood.  No change in color of mucus.  No wheezing.  No chest wall deformity  Skin: no rash or lesions.  GU: no dysuria, change in color of urine, no urgency or frequency.  No flank pain, no hematuria   MS:  No joint pain or swelling.  No decreased range of motion.  No back pain.  Psych:  No change in mood or affect. No depression or anxiety.  No memory loss.   Vital Signs BP (!) 163/76 (BP Location: Left Arm, Patient Position: Sitting, Cuff Size: Normal)   Pulse 80   Ht 5' 4 (1.626 m)   Wt 126 lb 9.6 oz (57.4 kg)   LMP  (LMP Unknown)   SpO2 93%   BMI 21.73 kg/m    Physical Exam:  General- No distress,  A&Ox3, pleasant, looks well ENT: No sinus tenderness, TM clear, pale nasal mucosa, no oral exudate,no post nasal drip, no LAN Cardiac: S1, S2, regular rate and rhythm, no murmur Chest: No wheeze/ rales/ dullness; no accessory muscle use, no nasal flaring, no sternal retractions Abd.: Soft Non-tender, ND, BS +, Body mass index is 21.73 kg/m.  Ext: No clubbing cyanosis, edema, no obvious deformities Neuro: Physical deconditioning but stronger than the last time I saw her., MAE x 4, alert and oriented x 3,  appropriate Skin: No rashes, warm and dry, no obvious skin lesions Psych: normal mood and behavior, very upbeat   Assessment/Plan Lung cancer with chemotherapy Ongoing chemotherapy shows improvement. CT scan indicates resolution of pleural effusion and reduced nodule size. Fatigue and anorexia post-chemotherapy improving. Weight increased. Tagrisso  side effects manageable. - Continue  chemotherapy regimen. - Monitor weight and appetite. - Manage Tagrisso  side effects with food. - Schedule next chemotherapy session for Monday, the 23rd. - Continue monitoring with CT scans - Follow-up with palliative care to ensure continued pain management - Follow-up with oncology as a scheduled.  Dyspnea with slow to resolve cough Chronic dyspnea and chest tightness managed with Bevespi  inhaler.  Cough resolved.  History of pleural effusion - Continue Bevespi  inhaler, two puffs twice daily, using a spacer. - Educate on proper inhaler technique: deep breath, exhale, activate inhaler, inhale, hold breath for 8-10 seconds. - Rinse mouth after inhaler use. - Referral to pulmonary rehab  Use of systemic steroids Prednisone  10 mg daily effective for symptom management and appetite improvement. Discussed long-term effects and potential dose reduction. - Continue prednisone  10 mg daily. - Discuss potential weaning to 7.5 mg at next follow-up in four months.  I spent 35 minutes dedicated to the care of this patient on the date of this encounter to include pre-visit review of records, face-to-face time with the patient discussing conditions above, post visit ordering of testing, clinical documentation with the electronic health record, making appropriate referrals as documented, and communicating necessary information to the patient's healthcare team.    Raejean Bullock, NP 12/04/2023  6:10 PM

## 2023-12-05 ENCOUNTER — Encounter

## 2023-12-05 ENCOUNTER — Other Ambulatory Visit

## 2023-12-05 ENCOUNTER — Other Ambulatory Visit: Payer: Self-pay

## 2023-12-05 ENCOUNTER — Ambulatory Visit

## 2023-12-05 ENCOUNTER — Ambulatory Visit: Admitting: Physician Assistant

## 2023-12-06 ENCOUNTER — Encounter: Payer: Self-pay | Admitting: General Practice

## 2023-12-06 NOTE — Progress Notes (Signed)
 [facility] Spiritual Care Note  Followed up with Allison [name] by phone, learning that yesterday's appointment got rescheduled to Monday 12/10/2023. She was in good spirits and looks forward to Lung Cancer Support Group virtual meeting at noon. We plan to follow up in person at [facility] treatment.   [address] Dorice Gardner, [place], [facility] Pager [phone] Voicemail [phone]

## 2023-12-07 ENCOUNTER — Other Ambulatory Visit (HOSPITAL_COMMUNITY): Payer: Self-pay

## 2023-12-07 MED ORDER — ALPRAZOLAM 0.5 MG PO TABS
0.5000 mg | ORAL_TABLET | Freq: Three times a day (TID) | ORAL | 3 refills | Status: AC | PRN
  Filled 2023-12-07: qty 90, 30d supply, fill #0
  Filled 2024-01-11: qty 90, 30d supply, fill #1
  Filled 2024-04-21: qty 90, 30d supply, fill #2

## 2023-12-10 ENCOUNTER — Inpatient Hospital Stay (HOSPITAL_BASED_OUTPATIENT_CLINIC_OR_DEPARTMENT_OTHER): Admitting: Nurse Practitioner

## 2023-12-10 ENCOUNTER — Encounter: Payer: Self-pay | Admitting: General Practice

## 2023-12-10 ENCOUNTER — Inpatient Hospital Stay (HOSPITAL_BASED_OUTPATIENT_CLINIC_OR_DEPARTMENT_OTHER): Attending: Internal Medicine | Admitting: Physician Assistant

## 2023-12-10 ENCOUNTER — Encounter: Payer: Self-pay | Admitting: Nurse Practitioner

## 2023-12-10 ENCOUNTER — Other Ambulatory Visit (HOSPITAL_COMMUNITY): Payer: Self-pay

## 2023-12-10 ENCOUNTER — Inpatient Hospital Stay

## 2023-12-10 ENCOUNTER — Encounter: Payer: Self-pay | Admitting: Physician Assistant

## 2023-12-10 ENCOUNTER — Inpatient Hospital Stay: Attending: Internal Medicine

## 2023-12-10 ENCOUNTER — Encounter: Payer: Self-pay | Admitting: Internal Medicine

## 2023-12-10 VITALS — BP 136/68 | HR 78 | Temp 98.3°F | Resp 16 | Wt 125.0 lb

## 2023-12-10 DIAGNOSIS — Z5111 Encounter for antineoplastic chemotherapy: Secondary | ICD-10-CM | POA: Diagnosis not present

## 2023-12-10 DIAGNOSIS — C3491 Malignant neoplasm of unspecified part of right bronchus or lung: Secondary | ICD-10-CM

## 2023-12-10 DIAGNOSIS — C3490 Malignant neoplasm of unspecified part of unspecified bronchus or lung: Secondary | ICD-10-CM | POA: Diagnosis not present

## 2023-12-10 DIAGNOSIS — C3411 Malignant neoplasm of upper lobe, right bronchus or lung: Secondary | ICD-10-CM | POA: Insufficient documentation

## 2023-12-10 DIAGNOSIS — Z515 Encounter for palliative care: Secondary | ICD-10-CM | POA: Diagnosis not present

## 2023-12-10 DIAGNOSIS — F419 Anxiety disorder, unspecified: Secondary | ICD-10-CM

## 2023-12-10 DIAGNOSIS — Z95828 Presence of other vascular implants and grafts: Secondary | ICD-10-CM

## 2023-12-10 DIAGNOSIS — Z7952 Long term (current) use of systemic steroids: Secondary | ICD-10-CM | POA: Insufficient documentation

## 2023-12-10 DIAGNOSIS — K5903 Drug induced constipation: Secondary | ICD-10-CM

## 2023-12-10 DIAGNOSIS — G893 Neoplasm related pain (acute) (chronic): Secondary | ICD-10-CM

## 2023-12-10 DIAGNOSIS — C7931 Secondary malignant neoplasm of brain: Secondary | ICD-10-CM | POA: Diagnosis not present

## 2023-12-10 DIAGNOSIS — Z79899 Other long term (current) drug therapy: Secondary | ICD-10-CM | POA: Insufficient documentation

## 2023-12-10 LAB — CBC WITH DIFFERENTIAL (CANCER CENTER ONLY)
Abs Immature Granulocytes: 0.05 10*3/uL (ref 0.00–0.07)
Basophils Absolute: 0 10*3/uL (ref 0.0–0.1)
Basophils Relative: 0 %
Eosinophils Absolute: 0 10*3/uL (ref 0.0–0.5)
Eosinophils Relative: 0 %
HCT: 36.7 % (ref 36.0–46.0)
Hemoglobin: 11.9 g/dL — ABNORMAL LOW (ref 12.0–15.0)
Immature Granulocytes: 1 %
Lymphocytes Relative: 9 %
Lymphs Abs: 0.9 10*3/uL (ref 0.7–4.0)
MCH: 27.7 pg (ref 26.0–34.0)
MCHC: 32.4 g/dL (ref 30.0–36.0)
MCV: 85.3 fL (ref 80.0–100.0)
Monocytes Absolute: 0.3 10*3/uL (ref 0.1–1.0)
Monocytes Relative: 3 %
Neutro Abs: 8.8 10*3/uL — ABNORMAL HIGH (ref 1.7–7.7)
Neutrophils Relative %: 87 %
Platelet Count: 200 10*3/uL (ref 150–400)
RBC: 4.3 MIL/uL (ref 3.87–5.11)
RDW: 17.8 % — ABNORMAL HIGH (ref 11.5–15.5)
WBC Count: 10.1 10*3/uL (ref 4.0–10.5)
nRBC: 0 % (ref 0.0–0.2)

## 2023-12-10 LAB — CMP (CANCER CENTER ONLY)
ALT: 31 U/L (ref 0–44)
AST: 21 U/L (ref 15–41)
Albumin: 4 g/dL (ref 3.5–5.0)
Alkaline Phosphatase: 49 U/L (ref 38–126)
Anion gap: 7 (ref 5–15)
BUN: 28 mg/dL — ABNORMAL HIGH (ref 8–23)
CO2: 26 mmol/L (ref 22–32)
Calcium: 9.7 mg/dL (ref 8.9–10.3)
Chloride: 104 mmol/L (ref 98–111)
Creatinine: 1.39 mg/dL — ABNORMAL HIGH (ref 0.44–1.00)
GFR, Estimated: 40 mL/min — ABNORMAL LOW (ref >60)
Glucose, Bld: 102 mg/dL — ABNORMAL HIGH (ref 70–99)
Potassium: 4.1 mmol/L (ref 3.5–5.1)
Sodium: 137 mmol/L (ref 135–145)
Total Bilirubin: 0.3 mg/dL (ref 0.0–1.2)
Total Protein: 6.9 g/dL (ref 6.5–8.1)

## 2023-12-10 MED ORDER — PROCHLORPERAZINE MALEATE 10 MG PO TABS
10.0000 mg | ORAL_TABLET | Freq: Once | ORAL | Status: AC
  Administered 2023-12-10: 10 mg via ORAL
  Filled 2023-12-10: qty 1

## 2023-12-10 MED ORDER — FENTANYL 12 MCG/HR TD PT72
1.0000 | MEDICATED_PATCH | TRANSDERMAL | 0 refills | Status: DC
  Filled 2023-12-14: qty 10, 30d supply, fill #0
  Filled ????-??-??: fill #0

## 2023-12-10 MED ORDER — HEPARIN SOD (PORK) LOCK FLUSH 100 UNIT/ML IV SOLN
500.0000 [IU] | Freq: Once | INTRAVENOUS | Status: AC | PRN
  Administered 2023-12-10: 500 [IU]

## 2023-12-10 MED ORDER — SODIUM CHLORIDE 0.9% FLUSH
10.0000 mL | Freq: Once | INTRAVENOUS | Status: AC
  Administered 2023-12-10: 10 mL

## 2023-12-10 MED ORDER — SODIUM CHLORIDE 0.9 % IV SOLN: INTRAVENOUS | Status: DC

## 2023-12-10 MED ORDER — SODIUM CHLORIDE 0.9 % IV SOLN
400.0000 mg/m2 | Freq: Once | INTRAVENOUS | Status: AC
  Administered 2023-12-10: 600 mg via INTRAVENOUS
  Filled 2023-12-10: qty 20

## 2023-12-10 MED ORDER — SODIUM CHLORIDE 0.9% FLUSH
10.0000 mL | INTRAVENOUS | Status: DC | PRN
  Administered 2023-12-10: 10 mL

## 2023-12-10 NOTE — Patient Instructions (Signed)
 [facility] - A DEPT OF [facility]  Discharge Instructions: Thank you for choosing [facility] to provide your oncology and hematology care.   If you have a lab appointment with the Cancer Center, please go directly to the Cancer Center and check in at the registration area.   Wear comfortable clothing and clothing appropriate for easy access to any Portacath or PICC line.   We strive to give you quality time with your provider. You may need to reschedule your appointment if you arrive late (15 or more minutes).  Arriving late affects you and other patients whose appointments are after yours.  Also, if you miss three or more appointments without notifying the office, you may be dismissed from the clinic at the provider's discretion.      For prescription refill requests, have your pharmacy contact our office and allow 72 hours for refills to be completed.    Today you received the following chemotherapy and/or immunotherapy agents Alimta      To help prevent nausea and vomiting after your treatment, we encourage you to take your nausea medication as directed.  BELOW ARE SYMPTOMS THAT SHOULD BE REPORTED IMMEDIATELY: *FEVER GREATER THAN 100.4 F (38 C) OR HIGHER *CHILLS OR SWEATING *NAUSEA AND VOMITING THAT IS NOT CONTROLLED WITH YOUR NAUSEA MEDICATION *UNUSUAL SHORTNESS OF BREATH *UNUSUAL BRUISING OR BLEEDING *URINARY PROBLEMS (pain or burning when urinating, or frequent urination) *BOWEL PROBLEMS (unusual diarrhea, constipation, pain near the anus) TENDERNESS IN MOUTH AND THROAT WITH OR WITHOUT PRESENCE OF ULCERS (sore throat, sores in mouth, or a toothache) UNUSUAL RASH, SWELLING OR PAIN  UNUSUAL VAGINAL DISCHARGE OR ITCHING   Items with * indicate a potential emergency and should be followed up as soon as possible or go to the Emergency Department if any problems should occur.  Please show the CHEMOTHERAPY ALERT CARD or IMMUNOTHERAPY  ALERT CARD at check-in to the Emergency Department and triage nurse.  Should you have questions after your visit or need to cancel or reschedule your appointment, please contact [facility] - A DEPT OF [facility]  Dept: [phone]  and follow the prompts.  Office hours are 8:00 a.m. to 4:30 p.m. Monday - Friday. Please note that voicemails left after 4:00 p.m. may not be returned until the following business day.  We are closed weekends and major holidays. You have access to a nurse at all times for urgent questions. Please call the main number to the clinic Dept: [phone] and follow the prompts.   For any non-urgent questions, you may also contact your provider using MyChart. We now offer e-Visits for anyone 79 and older to request care online for non-urgent symptoms. For details visit [email or web address].   Also download the MyChart app! Go to the app store, search "MyChart", open the app, select [place], and log in with your MyChart username and password.

## 2023-12-10 NOTE — Progress Notes (Signed)
 [facility] Spiritual Care Note  Followed up with Allison [name] briefly in person between appointments to offer support, encouragement, and spiritual/emotional/social support. She was disappointed to have missed Lung Cancer Support Group this month because of conflicting plans and looks forward to connecting with her familiar encouragers next month.   [address] [name], [place], [facility] Pager [phone] Voicemail [phone]

## 2023-12-11 ENCOUNTER — Encounter: Payer: Self-pay | Admitting: Internal Medicine

## 2023-12-11 ENCOUNTER — Encounter: Payer: Self-pay | Admitting: Physician Assistant

## 2023-12-11 NOTE — Progress Notes (Signed)
 Palliative Medicine [facility]  [phone] [phone]   Name: Allison [name] Date: 12/11/2023 MRN: [id number]  DOB: March 11, 1951  Patient Care Team: [name], MD as PCP - General (Internal Medicine) Kate Lonni LITTIE, MD as PCP - Cardiology (Cardiology) Shannon Agent, MD as Consulting Physician (Radiation Oncology) Pickenpack-Cousar, Fannie SAILOR, NP as Nurse Practitioner (Nurse Practitioner) Evertt Lonell BROCKS, RN as Oncology Nurse Navigator (Oncology) Sherrod Sherrod, MD as Consulting Physician (Oncology) Cloria Annabella LITTIE, DO (Geriatric Medicine) Prentis Duwaine BROCKS, RN as Oncology Nurse Navigator    INTERVAL HISTORY:stage IV non-small cell lung cancer (12/2017), hypertension, CVA, CAD, DVT/PE (on Lovenox ), and GERD.  Palliative ask to see for symptom management and goals of care.  Allison [name] is a 73 y.o. female with   SOCIAL HISTORY:     reports that she has never smoked. She has been exposed to tobacco smoke. She has never used smokeless tobacco. She reports that she does not currently use alcohol. She reports that she does not use drugs.  ADVANCE DIRECTIVES:  MOST and AD on file   CODE STATUS: DNR  PAST MEDICAL HISTORY: Past Medical History:  Diagnosis Date   Anemia    Anxiety    Arthritis    Asthma    exercise induced   Coronary artery disease    Depression    PMH   Dyspnea    GERD (gastroesophageal reflux disease)    Glaucoma    History of radiation therapy 01/05/2021   IMRT right lung  11/24/2020-01/05/2021  Dr Agent Shannon   Hypertension    lung ca dx'd 11/2017   right   Malignant pleural effusion    right   Nuclear sclerotic cataract of right eye 02/28/2021   Dr. Lonni Gaudy, cataract surgery February 2023   PONV (postoperative nausea and vomiting)    Pre-diabetes    Raynaud's disease    Raynaud's disease    Stroke (HCC) 01/2021   balance off, some express aphasia, weakness    ALLERGIES:  is  allergic to hydrocodone -acetaminophen , other, penicillins, lactose, vicodin [hydrocodone -acetaminophen ], bacid, and ipratropium-albuterol .  MEDICATIONS:  Current Outpatient Medications  Medication Sig Dispense Refill   ALPRAZolam  (XANAX ) 0.25 MG tablet Take 1 tablet (0.25 mg total) by mouth 2 (two) times daily as needed for anxiety. May take 2 tablets (0.5 mg total) at bedtime. (Patient taking differently: Take 0.25-0.5 mg by mouth 2 (two) times daily as needed for anxiety or sleep.) 60 tablet 0   ALPRAZolam  (XANAX ) 0.5 MG tablet Take 1 tablet (0.5 mg total) by mouth 3 (three) times daily as needed. 90 tablet 3   amLODipine  (NORVASC ) 5 MG tablet Take 1 tablet (5 mg total) by mouth daily. (Patient not taking: Reported on 12/04/2023) 90 tablet 3   carvedilol  (COREG ) 6.25 MG tablet Take 1 tablet (6.25 mg total) by mouth 2 (two) times daily. 180 tablet 3   dexamethasone  (DECADRON ) 4 MG tablet Take 1 tablet twice a day the day before, day of, and day after treatment (Patient not taking: Reported on 12/04/2023) 40 tablet 2   dorzolamide -timolol  (COSOPT ) 22.3-6.8 MG/ML ophthalmic solution Place 1 drop into both eyes 2 (two) times daily.     doxycycline  (VIBRA -TABS) 100 MG tablet Take 1 tablet (100 mg total) by mouth 2 (two) times daily. 14 tablet 0   enoxaparin  (LOVENOX ) 60 MG/0.6ML injection Inject 0.55 mLs (55 mg total) into the skin every 12 (twelve) hours. 100 mL 1   ezetimibe  (ZETIA ) 10  MG tablet Take 10 mg by mouth daily.     FeFum-FePoly-FA-B Cmp-C-Biot (FOLIVANE-PLUS) CAPS Take 1 capsule by mouth in the morning. 90 capsule 0   [START ON 12/14/2023] fentaNYL  (DURAGESIC ) 12 MCG/HR Place 1 patch onto the skin every 3 (three) days. 10 patch 0   FLUoxetine  (PROZAC ) 20 MG capsule Take 1 capsule (20 mg total) by mouth daily. 90 capsule 3   folic acid  (FOLVITE ) 1 MG tablet TAKE 1 TABLET BY MOUTH EVERY DAY 90 tablet 1   gabapentin  (NEURONTIN ) 250 MG/5ML solution Take 4 mLs (200 mg total) by mouth at bedtime.  470 mL 1   Glycopyrrolate -Formoterol  (BEVESPI  AEROSPHERE) 9-4.8 MCG/ACT AERO Inhale 2 puffs into the lungs 2 (two) times daily. 10.7 g 3   Glycopyrrolate -Formoterol  (BEVESPI  AEROSPHERE) 9-4.8 MCG/ACT AERO Inhale 2 puffs into the lungs 2 (two) times daily. 3 each 3   HYDROcodone  bit-homatropine (HYCODAN) 5-1.5 MG/5ML syrup Take 5 mLs by mouth every 6 (six) hours as needed for cough. 240 mL 0   hyoscyamine  (LEVSIN  SL) 0.125 MG SL tablet Place 1-2 tablets (0.125-0.25mg ) under the tongue every 6 (six) hours as needed for cramping. 30 tablet 3   isosorbide  mononitrate (IMDUR ) 30 MG 24 hr tablet Take 1 tablet (30 mg total) by mouth daily. 90 tablet 3   latanoprost  (XALATAN ) 0.005 % ophthalmic solution Place 1 drop into both eyes at bedtime.      ondansetron  (ZOFRAN -ODT) 4 MG disintegrating tablet Take 1 tablet (4 mg total) by mouth every 8 (eight) hours as needed for nausea or vomiting. 30 tablet 1   osimertinib  mesylate (TAGRISSO ) 80 MG tablet Take 1 tablet (80 mg total) by mouth daily. 30 tablet 4   oxyCODONE  (OXY IR/ROXICODONE ) 5 MG immediate release tablet Take 1-2 tablets (5-10 mg total) by mouth every 4 (four) hours as needed for severe pain (pain score 7-10) or breakthrough pain. 60 tablet 0   PEPCID  AC 10 MG tablet Take 10 mg by mouth 2 (two) times daily.     predniSONE  (DELTASONE ) 10 MG tablet Take 1 tablet (10 mg total) by mouth daily with breakfast. 30 tablet 2   prochlorperazine  (COMPAZINE ) 10 MG tablet Take 1 tablet (10 mg total) by mouth every 6 (six) hours as needed. 30 tablet 2   REPATHA  SURECLICK 140 MG/ML SOAJ Inject 140 mg into the skin every 14 (fourteen) days. 6 mL 3   senna-docusate (SENNA S) 8.6-50 MG tablet Take 2 tablets by mouth at bedtime. 60 tablet 3   Wheat Dextrin (BENEFIBER DRINK MIX) PACK Take 1 packet by mouth daily.     No current facility-administered medications for this visit.    VITAL SIGNS: LMP  (LMP Unknown)  There were no vitals filed for this visit.   Estimated body mass index is 21.46 kg/m as calculated from the following:   Height as of 12/04/23: 5' 4 (1.626 m).   Weight as of an earlier encounter on 12/10/23: 125 lb (56.7 kg).   PERFORMANCE STATUS (ECOG) : 2 - Symptomatic, <50% confined to bed   Physical Exam General: NAD Cardiovascular: regular rate and rhythm Pulmonary: normal breathing pattern  Extremities: no edema, no joint deformities Skin: no rashes Neurological: AAO x3  IMPRESSION: Discussed the use of AI scribe software for clinical note transcription with the patient, who gave verbal consent to proceed.  History of Present Illness Allison [name] is a 73 year old female who was seen during infusion for symptom management follow-up. Tolerating without difficulty. She is accompanied by her  Significant Other, Allison [name]. Doing well overall. Denies concerns of nausea, vomiting, constipation, or diarrhea that is uncontrolled. Reports her bowel movements are regular.  Appetite continues to improve. Weight is stable at 125lbs.   Allison [name] shares persistent chest wall discomfort at times. Reports pain and discomfort remains well controlled with use of fentanyl  patch. Does not require daily use of breakthrough medication. Her current regimen consiste of fentanyl  patch 15mcg and oxycodone  5-10mg  as needed. No adjustments to current regimen.   All questions answered and support provided.   Goals of Care Allison [name] and family continue to remain hopeful for stability with treatment.  Her goal is to continue to treat the treatable allow her every opportunity to continue to thrive while aggressively managing her symptoms.  Her quality of life is most important to her and her family.  I discussed the importance of continued conversation with family and their medical providers regarding overall plan of care and treatment options, ensuring decisions are within the context of the patients values and GOCs. Assessment & Plan Cancer  related pain Fentanyl  patch and oxycodone  used for pain control. Pain well controlled. Does not require daily use of breakthrough medication. No adjustments.  - Continue oxycodone  5-10 mg every 4-6 hours as needed. - Fentanyl  12.5 mcg patch for long-acting pain relief, change every three days. -Continue Xanax  as needed for anxiety. -Will continue to closely monitor and adjust regimen as needed  Constipation Much improved with use of Senna.  -Continue Senna-S 2 tablets at bedtime.   Jaw pain Intermittent sharp jaw pain, likely TMJ disorder or sinusitis. Dental causes ruled out. - Recommend acetaminophen  for pain management as advised by Cassie, PA.   Follow-up I will plan to see patient back in 4-6 weeks. Sooner if needed.   Patient expressed understanding and was in agreement with this plan. She also understands that She can call the clinic at any time with any questions, concerns, or complaints.   Any controlled substances utilized were prescribed in the context of palliative care. PDMP has been reviewed.   Visit consisted of counseling and education dealing with the complex and emotionally intense issues of symptom management and palliative care in the setting of serious and potentially life-threatening illness.  Levon Borer, AGPCNP-BC  Palliative Medicine Team/[facility]

## 2023-12-12 ENCOUNTER — Other Ambulatory Visit: Payer: Self-pay | Admitting: Physician Assistant

## 2023-12-12 ENCOUNTER — Telehealth: Payer: Self-pay | Admitting: Physician Assistant

## 2023-12-12 DIAGNOSIS — C3491 Malignant neoplasm of unspecified part of right bronchus or lung: Secondary | ICD-10-CM

## 2023-12-12 NOTE — Telephone Encounter (Signed)
 Scheduled appointments per los. Talked with the patient and she is aware of the made appointments.

## 2023-12-14 ENCOUNTER — Other Ambulatory Visit: Payer: Self-pay

## 2023-12-14 ENCOUNTER — Other Ambulatory Visit (HOSPITAL_COMMUNITY): Payer: Self-pay

## 2023-12-18 ENCOUNTER — Telehealth: Payer: Self-pay

## 2023-12-18 ENCOUNTER — Other Ambulatory Visit (HOSPITAL_COMMUNITY): Payer: Self-pay

## 2023-12-18 ENCOUNTER — Encounter: Payer: Self-pay | Admitting: Cardiology

## 2023-12-18 ENCOUNTER — Encounter: Payer: Self-pay | Admitting: Physician Assistant

## 2023-12-18 ENCOUNTER — Encounter: Payer: Self-pay | Admitting: Internal Medicine

## 2023-12-18 NOTE — Telephone Encounter (Signed)
 Renewed [employer] grant for patient's Repatha . Lorrene info has been provided to pharmacy. They are adjusting the claim now and will reach out once it's ready for the $0 copay.

## 2023-12-18 NOTE — Telephone Encounter (Signed)
 Patient Advocate Encounter   The patient was approved for a [employer] grant that will help cover the cost of REPATHA  Total amount awarded, $2,500.  Effective: 11/18/23 - 11/16/24   APW:[id number] ERW:[id number] Hmnle:[id number] [id number]   Pharmacy provided with approval and processing information, will inform patient once claim is adjusted.   Ileana Lehmann, CPhT  Pharmacy Patient Advocate Specialist  Direct Number: [phone] Fax: [phone]

## 2023-12-26 ENCOUNTER — Ambulatory Visit: Admitting: Internal Medicine

## 2023-12-26 ENCOUNTER — Ambulatory Visit

## 2023-12-26 ENCOUNTER — Other Ambulatory Visit

## 2023-12-26 NOTE — Progress Notes (Signed)
 [facility] OFFICE PROGRESS NOTE  Allison Iha, MD [address] [place] [address]  DIAGNOSIS: Stage IV (T2 a,N2, M1a) non-small cell lung cancer, adenocarcinoma diagnosed in July 2019 and presented with right upper lobe lung mass in addition to mediastinal lymphadenopathy as well as bilateral pulmonary nodules and malignant right pleural effusion.   Biomarker Findings Microsatellite status - Cannot Be Determined Tumor Mutational Burden - Cannot Be Determined Genomic Findings For a complete list of the genes assayed, please refer to the Appendix. EGFR exon 19 deletion ([id number]) TP53 Y220C 7 Disease relevant genes with no reportable alterations: KRAS, ALK, BRAF, MET, RET, ERBB2, ROS1    Repeat molecular studies in 2025 showed no new mutations  PRIOR THERAPY: 1) Status post right Pleurx catheter placement by Dr. Fleeta Ochoa for drainage of malignant right pleural effusion. 2) palliative radiotherapy to the enlarging right upper lobe lung mass and mediastinum under the care of Dr. Shannon expected to be completed on January 05, 2021. 3) Systemic chemotherapy with carboplatin  for AUC of 5 and Alimta  500 Mg/M2 every 3 weeks.  First dose 03/17/2021.  The patient will also continue her current treatment with Tagrisso  80 mg p.o. daily.  She is status post 26 cycles. Starting from cycle #7, she will be on Alimta  only 400 mg/m2.  Her treatment with Alimta  has been on hold for several months now.   CURRENT THERAPY: Tagrisso  80 mg p.o. daily.  First dose was given on 01/29/2018.  Status post 68.5 months of treatment.  IV chemotherapy with alimta  400 mg/m2 IV every 3 weeks to start around 09/11/23. Status post 5 cycles.   INTERVAL HISTORY: Allison [name] 73 y.o. female returns to the clinic today for a follow-up visit accompanied by her significant other. In summary, the patient was found to have disease progression a few months ago.  Therefore, she restarted  systemic chemotherapy with single agent Alimta  dose reduced to 400 mg/m for tolerability.  She is status post 4 cycles of treatment and tolerated it well overall. Her most recent CT scan showed a positive response to treatment. Symptomatically, she had significant improvement in her cough since starting treatment. Before treatment, she hardly could talk without coughing. She is on 10 mg of prednisone .   She experiences night sweats severe enough to necessitate changing clothes and adjusting the fan and AC for comfort which is not unusual for her.  At pulmonary they talked about reducing her dose of prednisone  to 7.5.  At her last appointment I let her know this would be a good idea to be on the lowest effective dose.  The patient also states in the interval she saw her eye doctor and her pressure for her glaucoma is slightly increased. Of note, she is currently on Decadron , which she takes the day before, the day of, and the day after treatment.   She continues to experience shortness of breath and has not yet started pulmonary rehabilitation due to a busy schedule. No fevers, chills, or hemoptysis. She recently attended a consolidated reunion weekend, which kept her busy. No abnormal bleeding or bruising, and previous bruising on her abdomen has resolved.  Her gastroesophageal reflux disease has returned, and she previously managed it with Nexium  20 mg, taken once in the morning and once at bedtime. She stopped taking it when her reflux improved but now feels the need to restart it. Of note, she is on Tagrisso . Her reflux may be increased because of the prednisone .  She denies any nausea, vomiting, or constipation.  She is on Tagrisso  which can intermittently cause diarrhea.  She denies any unusual diarrhea at this time.  She is here today for evaluation repeat blood work before undergoing cycle #6.     MEDICAL HISTORY: Past Medical History:  Diagnosis Date   Anemia    Anxiety    Arthritis     Asthma    exercise induced   Coronary artery disease    Depression    PMH   Dyspnea    GERD (gastroesophageal reflux disease)    Glaucoma    History of radiation therapy 01/05/2021   IMRT right lung  11/24/2020-01/05/2021  Dr Lynwood Nasuti   Hypertension    lung ca dx'd 11/2017   right   Malignant pleural effusion    right   Nuclear sclerotic cataract of right eye 02/28/2021   Dr. Lonni Gaudy, cataract surgery February 2023   PONV (postoperative nausea and vomiting)    Pre-diabetes    Raynaud's disease    Raynaud's disease    Stroke (HCC) 01/2021   balance off, some express aphasia, weakness    ALLERGIES:  is allergic to hydrocodone -acetaminophen , other, penicillins, lactose, vicodin [hydrocodone -acetaminophen ], bacid, and ipratropium-albuterol .  MEDICATIONS:  Current Outpatient Medications  Medication Sig Dispense Refill   ALPRAZolam  (XANAX ) 0.25 MG tablet Take 1 tablet (0.25 mg total) by mouth 2 (two) times daily as needed for anxiety. May take 2 tablets (0.5 mg total) at bedtime. (Patient taking differently: Take 0.25-0.5 mg by mouth 2 (two) times daily as needed for anxiety or sleep.) 60 tablet 0   ALPRAZolam  (XANAX ) 0.5 MG tablet Take 1 tablet (0.5 mg total) by mouth 3 (three) times daily as needed. 90 tablet 3   amLODipine  (NORVASC ) 5 MG tablet Take 1 tablet (5 mg total) by mouth daily. (Patient not taking: Reported on 12/04/2023) 90 tablet 3   carvedilol  (COREG ) 6.25 MG tablet Take 1 tablet (6.25 mg total) by mouth 2 (two) times daily. 180 tablet 3   dexamethasone  (DECADRON ) 4 MG tablet Take 1 tablet twice a day the day before, day of, and day after treatment (Patient not taking: Reported on 12/04/2023) 40 tablet 2   dorzolamide -timolol  (COSOPT ) 22.3-6.8 MG/ML ophthalmic solution Place 1 drop into both eyes 2 (two) times daily.     doxycycline  (VIBRA -TABS) 100 MG tablet Take 1 tablet (100 mg total) by mouth 2 (two) times daily. 14 tablet 0   enoxaparin  (LOVENOX ) 60 MG/0.6ML  injection Inject 0.55 mLs (55 mg total) into the skin every 12 (twelve) hours. 100 mL 1   ezetimibe  (ZETIA ) 10 MG tablet Take 10 mg by mouth daily.     FeFum-FePoly-FA-B Cmp-C-Biot (FOLIVANE-PLUS) CAPS Take 1 capsule by mouth in the morning. 90 capsule 0   fentaNYL  (DURAGESIC ) 12 MCG/HR Place 1 patch onto the skin every 3 (three) days. 10 patch 0   FLUoxetine  (PROZAC ) 20 MG capsule Take 1 capsule (20 mg total) by mouth daily. 90 capsule 3   folic acid  (FOLVITE ) 1 MG tablet TAKE 1 TABLET BY MOUTH EVERY DAY 90 tablet 1   gabapentin  (NEURONTIN ) 250 MG/5ML solution Take 4 mLs (200 mg total) by mouth at bedtime. 470 mL 1   Glycopyrrolate -Formoterol  (BEVESPI  AEROSPHERE) 9-4.8 MCG/ACT AERO Inhale 2 puffs into the lungs 2 (two) times daily. 10.7 g 3   Glycopyrrolate -Formoterol  (BEVESPI  AEROSPHERE) 9-4.8 MCG/ACT AERO Inhale 2 puffs into the lungs 2 (two) times daily. 3 each 3   HYDROcodone  bit-homatropine (HYCODAN) 5-1.5 MG/5ML  syrup Take 5 mLs by mouth every 6 (six) hours as needed for cough. 240 mL 0   hyoscyamine  (LEVSIN  SL) 0.125 MG SL tablet Place 1-2 tablets (0.125-0.25mg ) under the tongue every 6 (six) hours as needed for cramping. 30 tablet 3   isosorbide  mononitrate (IMDUR ) 30 MG 24 hr tablet Take 1 tablet (30 mg total) by mouth daily. 90 tablet 3   latanoprost  (XALATAN ) 0.005 % ophthalmic solution Place 1 drop into both eyes at bedtime.      ondansetron  (ZOFRAN -ODT) 4 MG disintegrating tablet Take 1 tablet (4 mg total) by mouth every 8 (eight) hours as needed for nausea or vomiting. 30 tablet 1   osimertinib  mesylate (TAGRISSO ) 80 MG tablet Take 1 tablet (80 mg total) by mouth daily. 30 tablet 4   oxyCODONE  (OXY IR/ROXICODONE ) 5 MG immediate release tablet Take 1-2 tablets (5-10 mg total) by mouth every 4 (four) hours as needed for severe pain (pain score 7-10) or breakthrough pain. 60 tablet 0   PEPCID  AC 10 MG tablet Take 10 mg by mouth 2 (two) times daily.     predniSONE  (DELTASONE ) 10 MG tablet  Take 1 tablet (10 mg total) by mouth daily with breakfast. 30 tablet 2   prochlorperazine  (COMPAZINE ) 10 MG tablet Take 1 tablet (10 mg total) by mouth every 6 (six) hours as needed. 30 tablet 2   REPATHA  SURECLICK 140 MG/ML SOAJ Inject 140 mg into the skin every 14 (fourteen) days. 6 mL 3   senna-docusate (SENNA S) 8.6-50 MG tablet Take 2 tablets by mouth at bedtime. 60 tablet 3   Wheat Dextrin (BENEFIBER DRINK MIX) PACK Take 1 packet by mouth daily.     No current facility-administered medications for this visit.    SURGICAL HISTORY:  Past Surgical History:  Procedure Laterality Date   ABDOMINAL HYSTERECTOMY     partial   BRONCHIAL BIOPSY  04/21/2021   Procedure: BRONCHIAL BIOPSIES;  Surgeon: Brenna Adine CROME, DO;  Location: [facility];  Service: Pulmonary;;   BRONCHIAL BRUSHINGS  04/21/2021   Procedure: BRONCHIAL BRUSHINGS;  Surgeon: Brenna Adine CROME, DO;  Location: [facility];  Service: Pulmonary;;   BRONCHIAL NEEDLE ASPIRATION BIOPSY  04/21/2021   Procedure: BRONCHIAL NEEDLE ASPIRATION BIOPSIES;  Surgeon: Brenna Adine CROME, DO;  Location: [facility];  Service: Pulmonary;;   CHEST TUBE INSERTION Right 01/01/2018   Procedure: INSERTION PLEURAL DRAINAGE CATHETER;  Surgeon: Fleeta Hanford Coy, MD;  Location: [facility] OR;  Service: Thoracic;  Laterality: Right;   CHEST TUBE INSERTION  04/21/2021   Procedure: CHEST TUBE INSERTION;  Surgeon: Brenna Adine CROME, DO;  Location: [facility];  Service: Pulmonary;;   COLONOSCOPY     CORONARY STENT INTERVENTION N/A 06/16/2019   Procedure: CORONARY STENT INTERVENTION;  Surgeon: Dann Candyce RAMAN, MD;  Location: [facility];  Service: Cardiovascular;  Laterality: N/A;   DILATION AND CURETTAGE OF UTERUS     EYE SURGERY     due to Glaucoma   IR IMAGING GUIDED PORT INSERTION  03/22/2021   IR PORT REPAIR CENTRAL VENOUS ACCESS DEVICE  04/15/2021   LEFT HEART CATH AND CORONARY ANGIOGRAPHY N/A 06/16/2019   Procedure: LEFT HEART CATH AND CORONARY  ANGIOGRAPHY;  Surgeon: Dann Candyce RAMAN, MD;  Location: [facility] INVASIVE CV LAB;  Service: Cardiovascular;  Laterality: N/A;   REMOVAL OF PLEURAL DRAINAGE CATHETER Right 11/07/2018   Procedure: REMOVAL OF PLEURAL DRAINAGE CATHETER;  Surgeon: Fleeta Hanford Coy, MD;  Location: [facility] OR;  Service: Thoracic;  Laterality: Right;  ROTATOR CUFF REPAIR     TUBAL LIGATION     VIDEO BRONCHOSCOPY WITH ENDOBRONCHIAL NAVIGATION Bilateral 04/21/2021   Procedure: VIDEO BRONCHOSCOPY WITH ENDOBRONCHIAL NAVIGATION;  Surgeon: Brenna Adine CROME, DO;  Location: [facility];  Service: Pulmonary;  Laterality: Bilateral;  ION   WISDOM TOOTH EXTRACTION      REVIEW OF SYSTEMS:   Constitutional: Positive for fatigue. Negative for chills and fever.  HENT: Negative for mouth sores, nosebleeds, sore throat and trouble swallowing.   Eyes: Negative for eye problems and icterus.  Respiratory: Positive fors significantly improved cough and stable shortness of breath with exertion. Negative for hemoptysis and wheezing.   Cardiovascular:  Negative for chest pain or leg swelling.  Gastrointestinal: Occasional mild diarrhea. Negative for abdominal pain, nausea, and vomiting.  Genitourinary: Negative for bladder incontinence, difficulty urinating, dysuria, frequency and hematuria.   Musculoskeletal: Negative for gait problem, neck pain and neck stiffness.  Skin: Negative for itching and rash.  Neurological: Negative for dizziness, extremity weakness, gait problem, headaches, light-headedness and seizures.  Hematological: Negative for adenopathy. Does not bruise/bleed easily.  Psychiatric/Behavioral: Negative for confusion, depression and sleep disturbance. The patient is not nervous/anxious.     PHYSICAL EXAMINATION:  There were no vitals taken for this visit.  ECOG PERFORMANCE STATUS: 1  Physical Exam  Constitutional: Oriented to person, place, and time and thin appearing female and in no distress.   HENT:  Head: Normocephalic  and atraumatic.  Mouth/Throat: Oropharynx is clear and moist. No oropharyngeal exudate.  Eyes: Conjunctivae are normal. Right eye exhibits no discharge. Left eye exhibits no discharge. No scleral icterus.  Neck: Normal range of motion. Neck supple.  Cardiovascular: Normal rate, regular rhythm, normal heart sounds and intact distal pulses.   Pulmonary/Chest: Effort normal and breath sounds clear to auscultation. No respiratory distress. No wheezes. No rales.  Abdominal: Soft. Bowel sounds are normal. Exhibits no distension and no mass. There is no tenderness.  Musculoskeletal: Normal range of motion. Exhibits no edema.  Lymphadenopathy:    No cervical adenopathy.  Neurological: Alert and oriented to person, place, and time. Exhibits muscle wasting. Gait normal. Coordination normal. Uses a cane for ambulation.  Skin: Skin is warm and dry. No rash noted. Not diaphoretic. No erythema. No pallor.  Psychiatric: Mood, memory and judgment normal.  Vitals reviewed.  LABORATORY DATA: Lab Results  Component Value Date   WBC 10.1 12/10/2023   HGB 11.9 (L) 12/10/2023   HCT 36.7 12/10/2023   MCV 85.3 12/10/2023   PLT 200 12/10/2023      Chemistry      Component Value Date/Time   NA 137 12/10/2023 1502   NA 140 03/04/2021 1516   K 4.1 12/10/2023 1502   CL 104 12/10/2023 1502   CO2 26 12/10/2023 1502   BUN 28 (H) 12/10/2023 1502   BUN 21 03/04/2021 1516   CREATININE 1.39 (H) 12/10/2023 1502      Component Value Date/Time   CALCIUM  9.7 12/10/2023 1502   ALKPHOS 49 12/10/2023 1502   AST 21 12/10/2023 1502   ALT 31 12/10/2023 1502   BILITOT 0.3 12/10/2023 1502       RADIOGRAPHIC STUDIES:  No results found.   ASSESSMENT/PLAN:  This is a very pleasant 73 year old never smoker African-American female diagnosed with stage IV non-small cell lung cancer, adenocarcinoma.  She was positive for an EGFR mutation with deletion in exon 19.  She was diagnosed in July 2019 and presented with  right upper lobe lung mass in addition  to mediastinal lymphadenopathy as well as bilateral pulmonary nodules and malignant right pleural effusion.   The patient was started on treatment with Tagrisso  80 mg p.o. daily status post 39 months of treatment. Started on 01/29/2018.   In April 2022, she showed evidence of disease progression with interval progression of the right apical lung mass in addition to progression of mediastinal lymphadenopathy concerning for worsening of her disease. She had repeat Guardant 360 molecular studies which did not show evidence of new resistant mutations.  Therefore, she was referred to radiation oncology and completed palliative radiotherapy to the enlarging right upper lobe lung mass and mediastinum under the care of Dr. Shannon.  This was completed in July 2022.    The patient underwent a repeat bronchoscopy and biopsy for repeat molecular testing.  The sample from the left and right lung biopsy was negative for malignancy. This was performed in November 2022.    Unfortunately, the patient  was found to have evidence of disease progression.  Therefore, Dr. Sherrod started the patient on systemic chemotherapy with carboplatin  for AUC of 5 and Alimta  500 mg per metered squared.  She is status post 23 cycles and she tolerated it fairly well despite her ongoing issues with fatigue, chest tightness, cough, and weight loss.  Alimta  was reduced to 400 mg per metered squared due to renal insufficiency.  She is not a good candidate for Avastin due to her recent CVA in August 2022.  She also is continuing to take Tagrisso  as it is protective against progressive metastatic disease to the brain. Starting from cycle #7,  she started maintenance single agent alimta . Her treatment has been on hold since March 2024 due to side effects of treatment.    Of note, the patient saw Dr. Valentino from [facility] for second opinion in November 2022.  They discussed if she progresses on  chemotherapy that there may be upcoming options. He discussed other treatment options which may be available in 2023 including patritumab deruxtcan and lazertinib/amivantmab.    Her most recent restaging CT scan showed disease progression with new and increasing septal thickening and perilymphatic nodularity throughout the right hemithorax suspicious for lymphangitic spread of tumor.  A PET scan to confirm this.   Therefore, at her appointment on 08/23/2023 we discussed the options.  She had repeat molecular studies performed which did not show any new actionable mutations.  Therefore the plan will be to proceed with single agent Alimta  IV every 3 weeks as scheduled.  The alternative options that we discussed was adding carboplatin  to the Alimta .  We also talked about amivantamab and lazertinib, but the patient and Dr. Sherrod have concerns about quality of life and side effect profile, although this likely would be effective   The patient opted to proceed on single agent chemotherapy with Alimta  400 mg/m.  She started this on 09/11/2023.  She tolerated this fairly well. She is status post 5 cycles of treatment.   She looks much improved since she starting treatment.     Labs were reviewed.  Recommend that she proceed with cycle 6 today scheduled.    We will see her back for follow-up visit in 3 weeks for evaluation review blood work before undergoing cycle #7.  I will arrange for a restaging CT of the CAP prior to her next cycle of treatment.   She will continue taking prednisone  10 mg daily.  I did let her know that it may not be a bad idea to  reduce her dose to 7.5 mg since her cough is improved to be on the lowest effective dose of prednisone  as the steroids may be contributing to the glaucoma and the increased reflux.  I checked with the oral chemotherapy pharmacist.  The patient was requiring a refill of Nexium .  Due to the drug to drug interactions with PPIs and Tagrisso  I have sent Pepcid   instead.    She will continue to follow with palliative care for pain management.  Her pain is significantly improved at this time.    She will continue lovenox  for her history of recurrent DVTs.  He is currently taking this once a day.   Anxiety Anxiety managed with nightly Xanax .  We refilled her Emla  cream.  The patient was advised to call immediately if she has any concerning symptoms in the interval. The patient voices understanding of current disease status and treatment options and is in agreement with the current care plan. All questions were answered. The patient knows to call the clinic with any problems, questions or concerns. We can certainly see the patient much sooner if necessary              No orders of the defined types were placed in this encounter.    The total time spent in the appointment was 20-29 minutes  Allison Bauch L Charlcie Prisco, PA-C 12/26/23

## 2023-12-30 ENCOUNTER — Other Ambulatory Visit: Payer: Self-pay | Admitting: Internal Medicine

## 2023-12-31 ENCOUNTER — Inpatient Hospital Stay: Attending: Internal Medicine

## 2023-12-31 ENCOUNTER — Inpatient Hospital Stay

## 2023-12-31 ENCOUNTER — Inpatient Hospital Stay (HOSPITAL_BASED_OUTPATIENT_CLINIC_OR_DEPARTMENT_OTHER): Admitting: Physician Assistant

## 2023-12-31 ENCOUNTER — Encounter: Payer: Self-pay | Admitting: Internal Medicine

## 2023-12-31 VITALS — BP 135/64 | HR 78 | Temp 98.7°F | Resp 16 | Ht 64.0 in | Wt 131.7 lb

## 2023-12-31 DIAGNOSIS — Z95828 Presence of other vascular implants and grafts: Secondary | ICD-10-CM

## 2023-12-31 DIAGNOSIS — C3491 Malignant neoplasm of unspecified part of right bronchus or lung: Secondary | ICD-10-CM

## 2023-12-31 DIAGNOSIS — Z5111 Encounter for antineoplastic chemotherapy: Secondary | ICD-10-CM | POA: Diagnosis present

## 2023-12-31 DIAGNOSIS — C3490 Malignant neoplasm of unspecified part of unspecified bronchus or lung: Secondary | ICD-10-CM | POA: Diagnosis not present

## 2023-12-31 DIAGNOSIS — C3411 Malignant neoplasm of upper lobe, right bronchus or lung: Secondary | ICD-10-CM | POA: Insufficient documentation

## 2023-12-31 DIAGNOSIS — K219 Gastro-esophageal reflux disease without esophagitis: Secondary | ICD-10-CM | POA: Insufficient documentation

## 2023-12-31 DIAGNOSIS — Z86718 Personal history of other venous thrombosis and embolism: Secondary | ICD-10-CM | POA: Diagnosis not present

## 2023-12-31 DIAGNOSIS — G893 Neoplasm related pain (acute) (chronic): Secondary | ICD-10-CM | POA: Diagnosis not present

## 2023-12-31 DIAGNOSIS — F419 Anxiety disorder, unspecified: Secondary | ICD-10-CM | POA: Diagnosis not present

## 2023-12-31 DIAGNOSIS — Z8673 Personal history of transient ischemic attack (TIA), and cerebral infarction without residual deficits: Secondary | ICD-10-CM | POA: Insufficient documentation

## 2023-12-31 DIAGNOSIS — Z79891 Long term (current) use of opiate analgesic: Secondary | ICD-10-CM | POA: Insufficient documentation

## 2023-12-31 DIAGNOSIS — C771 Secondary and unspecified malignant neoplasm of intrathoracic lymph nodes: Secondary | ICD-10-CM | POA: Diagnosis not present

## 2023-12-31 DIAGNOSIS — Z923 Personal history of irradiation: Secondary | ICD-10-CM | POA: Insufficient documentation

## 2023-12-31 DIAGNOSIS — Z79899 Other long term (current) drug therapy: Secondary | ICD-10-CM | POA: Insufficient documentation

## 2023-12-31 DIAGNOSIS — Z7901 Long term (current) use of anticoagulants: Secondary | ICD-10-CM | POA: Insufficient documentation

## 2023-12-31 DIAGNOSIS — T50995S Adverse effect of other drugs, medicaments and biological substances, sequela: Secondary | ICD-10-CM | POA: Diagnosis not present

## 2023-12-31 DIAGNOSIS — Z7952 Long term (current) use of systemic steroids: Secondary | ICD-10-CM | POA: Diagnosis not present

## 2023-12-31 LAB — CMP (CANCER CENTER ONLY)
ALT: 29 U/L (ref 0–44)
AST: 21 U/L (ref 15–41)
Albumin: 3.7 g/dL (ref 3.5–5.0)
Alkaline Phosphatase: 44 U/L (ref 38–126)
Anion gap: 5 (ref 5–15)
BUN: 30 mg/dL — ABNORMAL HIGH (ref 8–23)
CO2: 27 mmol/L (ref 22–32)
Calcium: 9.4 mg/dL (ref 8.9–10.3)
Chloride: 104 mmol/L (ref 98–111)
Creatinine: 1.22 mg/dL — ABNORMAL HIGH (ref 0.44–1.00)
GFR, Estimated: 47 mL/min — ABNORMAL LOW (ref >60)
Glucose, Bld: 108 mg/dL — ABNORMAL HIGH (ref 70–99)
Potassium: 4.6 mmol/L (ref 3.5–5.1)
Sodium: 136 mmol/L (ref 135–145)
Total Bilirubin: 0.2 mg/dL (ref 0.0–1.2)
Total Protein: 6.2 g/dL — ABNORMAL LOW (ref 6.5–8.1)

## 2023-12-31 LAB — CBC WITH DIFFERENTIAL (CANCER CENTER ONLY)
Abs Immature Granulocytes: 0.04 K/uL (ref 0.00–0.07)
Basophils Absolute: 0 K/uL (ref 0.0–0.1)
Basophils Relative: 0 %
Eosinophils Absolute: 0 K/uL (ref 0.0–0.5)
Eosinophils Relative: 0 %
HCT: 34.4 % — ABNORMAL LOW (ref 36.0–46.0)
Hemoglobin: 11.1 g/dL — ABNORMAL LOW (ref 12.0–15.0)
Immature Granulocytes: 0 %
Lymphocytes Relative: 9 %
Lymphs Abs: 0.8 K/uL (ref 0.7–4.0)
MCH: 28.1 pg (ref 26.0–34.0)
MCHC: 32.3 g/dL (ref 30.0–36.0)
MCV: 87.1 fL (ref 80.0–100.0)
Monocytes Absolute: 0.4 K/uL (ref 0.1–1.0)
Monocytes Relative: 4 %
Neutro Abs: 8.4 K/uL — ABNORMAL HIGH (ref 1.7–7.7)
Neutrophils Relative %: 87 %
Platelet Count: 175 K/uL (ref 150–400)
RBC: 3.95 MIL/uL (ref 3.87–5.11)
RDW: 16 % — ABNORMAL HIGH (ref 11.5–15.5)
WBC Count: 9.7 K/uL (ref 4.0–10.5)
nRBC: 0 % (ref 0.0–0.2)

## 2023-12-31 MED ORDER — FAMOTIDINE 20 MG PO TABS: 20.0000 mg | ORAL_TABLET | Freq: Two times a day (BID) | ORAL | 2 refills | Status: DC

## 2023-12-31 MED ORDER — LIDOCAINE-PRILOCAINE 2.5-2.5 % EX CREA: 1.0000 | TOPICAL_CREAM | CUTANEOUS | 0 refills | Status: AC | PRN

## 2023-12-31 MED ORDER — CYANOCOBALAMIN 1000 MCG/ML IJ SOLN
1000.0000 ug | Freq: Once | INTRAMUSCULAR | Status: AC
  Administered 2023-12-31: 1000 ug via INTRAMUSCULAR
  Filled 2023-12-31: qty 1

## 2023-12-31 MED ORDER — SODIUM CHLORIDE 0.9% FLUSH
10.0000 mL | Freq: Once | INTRAVENOUS | Status: AC
  Administered 2023-12-31: 10 mL

## 2023-12-31 MED ORDER — HEPARIN SOD (PORK) LOCK FLUSH 100 UNIT/ML IV SOLN
500.0000 [IU] | Freq: Once | INTRAVENOUS | Status: AC | PRN
Start: 2023-12-31 — End: 2023-12-31
  Administered 2023-12-31: 500 [IU]

## 2023-12-31 MED ORDER — SODIUM CHLORIDE 0.9% FLUSH
10.0000 mL | INTRAVENOUS | Status: DC | PRN
  Administered 2023-12-31: 10 mL

## 2023-12-31 MED ORDER — SODIUM CHLORIDE 0.9 % IV SOLN
400.0000 mg/m2 | Freq: Once | INTRAVENOUS | Status: AC
  Administered 2023-12-31: 600 mg via INTRAVENOUS
  Filled 2023-12-31: qty 20

## 2023-12-31 MED ORDER — SODIUM CHLORIDE 0.9 % IV SOLN: INTRAVENOUS | Status: DC

## 2023-12-31 MED ORDER — PROCHLORPERAZINE MALEATE 10 MG PO TABS
10.0000 mg | ORAL_TABLET | Freq: Once | ORAL | Status: AC
  Administered 2023-12-31: 10 mg via ORAL
  Filled 2023-12-31: qty 1

## 2024-01-09 ENCOUNTER — Other Ambulatory Visit: Payer: Self-pay | Admitting: Physician Assistant

## 2024-01-09 DIAGNOSIS — C3491 Malignant neoplasm of unspecified part of right bronchus or lung: Secondary | ICD-10-CM

## 2024-01-09 DIAGNOSIS — K219 Gastro-esophageal reflux disease without esophagitis: Secondary | ICD-10-CM

## 2024-01-10 ENCOUNTER — Ambulatory Visit (HOSPITAL_COMMUNITY)
Admission: RE | Admit: 2024-01-10 | Discharge: 2024-01-10 | Disposition: A | Discharge status: Home / Self Care | Source: Ambulatory Visit | Attending: Physician Assistant | Admitting: Physician Assistant

## 2024-01-10 DIAGNOSIS — C3491 Malignant neoplasm of unspecified part of right bronchus or lung: Secondary | ICD-10-CM | POA: Diagnosis present

## 2024-01-10 MED ORDER — IOHEXOL 300 MG/ML  SOLN
100.0000 mL | Freq: Once | INTRAMUSCULAR | Status: AC | PRN
  Administered 2024-01-10: 100 mL via INTRAVENOUS

## 2024-01-10 MED ORDER — HEPARIN SOD (PORK) LOCK FLUSH 100 UNIT/ML IV SOLN
500.0000 [IU] | Freq: Once | INTRAVENOUS | Status: AC
  Administered 2024-01-10: 500 [IU] via INTRAVENOUS

## 2024-01-11 ENCOUNTER — Other Ambulatory Visit (HOSPITAL_COMMUNITY): Payer: Self-pay

## 2024-01-16 NOTE — Progress Notes (Signed)
 [facility] OFFICE PROGRESS NOTE  [name], MD [address] [place] [address]  DIAGNOSIS: Stage IV (T2 a,N2, M1a) non-small cell lung cancer, adenocarcinoma diagnosed in July 2019 and presented with right upper lobe lung mass in addition to mediastinal lymphadenopathy as well as bilateral pulmonary nodules and malignant right pleural effusion.   Biomarker Findings Microsatellite status - Cannot Be Determined Tumor Mutational Burden - Cannot Be Determined Genomic Findings For a complete list of the genes assayed, please refer to the Appendix. EGFR exon 19 deletion ([id number]) TP53 Y220C 7 Disease relevant genes with no reportable alterations: KRAS, ALK, BRAF, MET, RET, ERBB2, ROS1    Repeat molecular studies in 2025 showed no new mutations  PRIOR THERAPY: 1) Status post right Pleurx catheter placement by Dr. Fleeta Ochoa for drainage of malignant right pleural effusion. 2) palliative radiotherapy to the enlarging right upper lobe lung mass and mediastinum under the care of Dr. Shannon expected to be completed on January 05, 2021. 3) Systemic chemotherapy with carboplatin  for AUC of 5 and Alimta  500 Mg/M2 every 3 weeks.  First dose 03/17/2021.  The patient will also continue her current treatment with Tagrisso  80 mg p.o. daily.  She is status post 26 cycles. Starting from cycle #7, she will be on Alimta  only 400 mg/m2.  Her treatment with Alimta  has been on hold for several months now.   CURRENT THERAPY: Tagrisso  80 mg p.o. daily.  First dose was given on 01/29/2018.  Status post 72  IV chemotherapy with alimta  400 mg/m2 IV every 3 weeks to start around 09/11/23. Status post 6 cycles.   INTERVAL HISTORY: Allison [name] 73 y.o. female returns to the clinic today for a follow-up visit accompanied by her significant other. In summary, the patient was found to have disease progression in early 2025. Therefore, she restarted systemic chemotherapy with single  agent Alimta  dose reduced to 400 mg/m for tolerability.  She is status post 6 cycles of treatment and tolerated it well overall. Her most recent CT scan showed a positive response to treatment. Symptomatically, she had significant improvement in her cough since starting treatment. Before treatment, she hardly could talk without coughing. She is on 10 mg of prednisone .   She experiences night sweats severe enough to necessitate changing clothes and adjusting the fan and AC for comfort which is not unusual for her.   At pulmonary they talked about reducing her dose of prednisone  to 7.5. At her last appointment I let her know this would be a good idea to be on the lowest effective dose.  She is currently on Decadron , which she takes the day before, the day of, and the day after treatment. She takes pepcid  which helps if she takes it first thing in the AM.   The only new concern is related to some cramping in the lower extremities without any swelling or erythema. She thinks she is hydrating well at home and drinking propell. She uses clindamycin  cream for the mild paronychia from her tagrisso .   No nausea or vomiting.  She is considering going back to the gym this week.  It is not unusual for her to have some fatigue following treatment.  She continues to experience shortness of breath. No fevers, chills, or hemoptysis. No abnormal bleeding or bruising.  She denies any nausea, vomiting, or constipation.  She is on Tagrisso  which can intermittently cause diarrhea.  She denies any unusual diarrhea at this time.  She recently had  a restaging CT scan.   She is here today for evaluation repeat blood work before undergoing cycle #7.       MEDICAL HISTORY: Past Medical History:  Diagnosis Date   Anemia    Anxiety    Arthritis    Asthma    exercise induced   Coronary artery disease    Depression    PMH   Dyspnea    GERD (gastroesophageal reflux disease)    Glaucoma    History of radiation  therapy 01/05/2021   IMRT right lung  11/24/2020-01/05/2021  Dr Lynwood Nasuti   Hypertension    lung ca dx'd 11/2017   right   Malignant pleural effusion    right   Nuclear sclerotic cataract of right eye 02/28/2021   Dr. Lonni Gaudy, cataract surgery February 2023   PONV (postoperative nausea and vomiting)    Pre-diabetes    Raynaud's disease    Raynaud's disease    Stroke (HCC) 01/2021   balance off, some express aphasia, weakness    ALLERGIES:  is allergic to hydrocodone -acetaminophen , other, penicillins, lactose, vicodin [hydrocodone -acetaminophen ], bacid, and ipratropium-albuterol .  MEDICATIONS:  Current Outpatient Medications  Medication Sig Dispense Refill   ALPRAZolam  (XANAX ) 0.25 MG tablet Take 1 tablet (0.25 mg total) by mouth 2 (two) times daily as needed for anxiety. May take 2 tablets (0.5 mg total) at bedtime. (Patient taking differently: Take 0.25-0.5 mg by mouth 2 (two) times daily as needed for anxiety or sleep.) 60 tablet 0   ALPRAZolam  (XANAX ) 0.5 MG tablet Take 1 tablet (0.5 mg total) by mouth 3 (three) times daily as needed. 90 tablet 3   amLODipine  (NORVASC ) 5 MG tablet Take 1 tablet (5 mg total) by mouth daily. (Patient not taking: Reported on 12/04/2023) 90 tablet 3   carvedilol  (COREG ) 6.25 MG tablet Take 1 tablet (6.25 mg total) by mouth 2 (two) times daily. 180 tablet 3   dexamethasone  (DECADRON ) 4 MG tablet Take 1 tablet twice a day the day before, day of, and day after treatment (Patient not taking: Reported on 12/04/2023) 40 tablet 2   dorzolamide -timolol  (COSOPT ) 22.3-6.8 MG/ML ophthalmic solution Place 1 drop into both eyes 2 (two) times daily.     doxycycline  (VIBRA -TABS) 100 MG tablet Take 1 tablet (100 mg total) by mouth 2 (two) times daily. 14 tablet 0   enoxaparin  (LOVENOX ) 60 MG/0.6ML injection Inject 0.55 mLs (55 mg total) into the skin every 12 (twelve) hours. 100 mL 1   ezetimibe  (ZETIA ) 10 MG tablet Take 10 mg by mouth daily.     famotidine   (PEPCID ) 20 MG tablet TAKE 1 TABLET BY MOUTH TWICE A DAY 180 tablet 1   FeFum-FePoly-FA-B Cmp-C-Biot (FOLIVANE-PLUS) CAPS Take 1 capsule by mouth in the morning. 90 capsule 0   fentaNYL  (DURAGESIC ) 12 MCG/HR Place 1 patch onto the skin every 3 (three) days. 10 patch 0   FLUoxetine  (PROZAC ) 20 MG capsule Take 1 capsule (20 mg total) by mouth daily. 90 capsule 3   folic acid  (FOLVITE ) 1 MG tablet TAKE 1 TABLET BY MOUTH EVERY DAY 90 tablet 1   gabapentin  (NEURONTIN ) 250 MG/5ML solution Take 4 mLs (200 mg total) by mouth at bedtime. 470 mL 1   Glycopyrrolate -Formoterol  (BEVESPI  AEROSPHERE) 9-4.8 MCG/ACT AERO Inhale 2 puffs into the lungs 2 (two) times daily. 10.7 g 3   Glycopyrrolate -Formoterol  (BEVESPI  AEROSPHERE) 9-4.8 MCG/ACT AERO Inhale 2 puffs into the lungs 2 (two) times daily. 3 each 3   HYDROcodone  bit-homatropine (HYCODAN) 5-1.5 MG/5ML syrup  Take 5 mLs by mouth every 6 (six) hours as needed for cough. 240 mL 0   hyoscyamine  (LEVSIN  SL) 0.125 MG SL tablet Place 1-2 tablets (0.125-0.25mg ) under the tongue every 6 (six) hours as needed for cramping. 30 tablet 3   isosorbide  mononitrate (IMDUR ) 30 MG 24 hr tablet Take 1 tablet (30 mg total) by mouth daily. 90 tablet 3   latanoprost  (XALATAN ) 0.005 % ophthalmic solution Place 1 drop into both eyes at bedtime.      lidocaine -prilocaine  (EMLA ) cream Apply 1 Application topically as needed. 30 g 0   ondansetron  (ZOFRAN -ODT) 4 MG disintegrating tablet Take 1 tablet (4 mg total) by mouth every 8 (eight) hours as needed for nausea or vomiting. 30 tablet 1   osimertinib  mesylate (TAGRISSO ) 80 MG tablet Take 1 tablet (80 mg total) by mouth daily. 30 tablet 4   oxyCODONE  (OXY IR/ROXICODONE ) 5 MG immediate release tablet Take 1-2 tablets (5-10 mg total) by mouth every 4 (four) hours as needed for severe pain (pain score 7-10) or breakthrough pain. 60 tablet 0   predniSONE  (DELTASONE ) 10 MG tablet Take 1 tablet (10 mg total) by mouth daily with breakfast. 30  tablet 2   prochlorperazine  (COMPAZINE ) 10 MG tablet Take 1 tablet (10 mg total) by mouth every 6 (six) hours as needed. 30 tablet 2   REPATHA  SURECLICK 140 MG/ML SOAJ Inject 140 mg into the skin every 14 (fourteen) days. 6 mL 3   senna-docusate (SENNA S) 8.6-50 MG tablet Take 2 tablets by mouth at bedtime. 60 tablet 3   Wheat Dextrin (BENEFIBER DRINK MIX) PACK Take 1 packet by mouth daily.     No current facility-administered medications for this visit.    SURGICAL HISTORY:  Past Surgical History:  Procedure Laterality Date   ABDOMINAL HYSTERECTOMY     partial   BRONCHIAL BIOPSY  04/21/2021   Procedure: BRONCHIAL BIOPSIES;  Surgeon: Brenna Adine CROME, DO;  Location: [facility];  Service: Pulmonary;;   BRONCHIAL BRUSHINGS  04/21/2021   Procedure: BRONCHIAL BRUSHINGS;  Surgeon: Brenna Adine CROME, DO;  Location: [facility];  Service: Pulmonary;;   BRONCHIAL NEEDLE ASPIRATION BIOPSY  04/21/2021   Procedure: BRONCHIAL NEEDLE ASPIRATION BIOPSIES;  Surgeon: Brenna Adine CROME, DO;  Location: [facility];  Service: Pulmonary;;   CHEST TUBE INSERTION Right 01/01/2018   Procedure: INSERTION PLEURAL DRAINAGE CATHETER;  Surgeon: Fleeta Hanford Coy, MD;  Location: [facility] OR;  Service: Thoracic;  Laterality: Right;   CHEST TUBE INSERTION  04/21/2021   Procedure: CHEST TUBE INSERTION;  Surgeon: Brenna Adine CROME, DO;  Location: [facility]OS[facility];  Service: Pulmonary;;   COLONOSCOPY     CORONARY STENT INTERVENTION N/A 06/16/2019   Procedure: CORONARY STENT INTERVENTION;  Surgeon: Dann Candyce RAMAN, MD;  Location: [facility] INVASIVE CV LAB;  Service: Cardiovascular;  Laterality: N/A;   DILATION AND CURETTAGE OF UTERUS     EYE SURGERY     due to Glaucoma   IR IMAGING GUIDED PORT INSERTION  03/22/2021   IR PORT REPAIR CENTRAL VENOUS ACCESS DEVICE  04/15/2021   LEFT HEART CATH AND CORONARY ANGIOGRAPHY N/A 06/16/2019   Procedure: LEFT HEART CATH AND CORONARY ANGIOGRAPHY;  Surgeon: Dann Candyce RAMAN, MD;  Location: [facility]  INVASIVE CV LAB;  Service: Cardiovascular;  Laterality: N/A;   REMOVAL OF PLEURAL DRAINAGE CATHETER Right 11/07/2018   Procedure: REMOVAL OF PLEURAL DRAINAGE CATHETER;  Surgeon: Fleeta Hanford Coy, MD;  Location: [facility] OR;  Service: Thoracic;  Laterality: Right;   ROTATOR CUFF REPAIR  TUBAL LIGATION     VIDEO BRONCHOSCOPY WITH ENDOBRONCHIAL NAVIGATION Bilateral 04/21/2021   Procedure: VIDEO BRONCHOSCOPY WITH ENDOBRONCHIAL NAVIGATION;  Surgeon: Brenna Adine CROME, DO;  Location: [facility];  Service: Pulmonary;  Laterality: Bilateral;  ION   WISDOM TOOTH EXTRACTION      REVIEW OF SYSTEMS:   Constitutional: Positive for fatigue. Negative for chills and fever.  HENT: Negative for mouth sores, nosebleeds, sore throat and trouble swallowing.   Eyes: Negative for eye problems and icterus.  Respiratory: Positive fors significantly improved cough and stable shortness of breath with exertion. Negative for hemoptysis and wheezing.   Cardiovascular:  Negative for chest pain or leg swelling.  Gastrointestinal: Occasional mild diarrhea. Negative for abdominal pain, nausea, and vomiting.  Genitourinary: Negative for bladder incontinence, difficulty urinating, dysuria, frequency and hematuria.   Musculoskeletal: Occasional leg cramping. Negative for gait problem, neck pain and neck stiffness.  Skin: Mild paronychia around the nails.  Negative for itching and rash.  Neurological: Negative for dizziness, extremity weakness, gait problem, headaches, light-headedness and seizures.  Hematological: Negative for adenopathy. Does not bruise/bleed easily.  Psychiatric/Behavioral: Negative for confusion, depression and sleep disturbance. The patient is not nervous/anxious.     PHYSICAL EXAMINATION:  There were no vitals taken for this visit.  ECOG PERFORMANCE STATUS: 1  Physical Exam  Constitutional: Oriented to person, place, and time and thin appearing female and in no distress.   HENT:  Head: Normocephalic  and atraumatic.  Mouth/Throat: Oropharynx is clear and moist. No oropharyngeal exudate.  Eyes: Conjunctivae are normal. Right eye exhibits no discharge. Left eye exhibits no discharge. No scleral icterus.  Neck: Normal range of motion. Neck supple.  Cardiovascular: Normal rate, regular rhythm, normal heart sounds and intact distal pulses.   Pulmonary/Chest: Effort normal and breath sounds clear to auscultation. No respiratory distress. No wheezes. No rales.  Abdominal: Soft. Bowel sounds are normal. Exhibits no distension and no mass. There is no tenderness.  Musculoskeletal: Normal range of motion. Exhibits no edema.  Lymphadenopathy:    No cervical adenopathy.  Neurological: Alert and oriented to person, place, and time. Exhibits muscle wasting. Gait normal. Coordination normal. Uses a cane for ambulation.  Skin: Skin is warm and dry. No rash noted. Not diaphoretic. No erythema. No pallor.  Psychiatric: Mood, memory and judgment normal.  Vitals reviewed.  LABORATORY DATA: Lab Results  Component Value Date   WBC 9.7 12/31/2023   HGB 11.1 (L) 12/31/2023   HCT 34.4 (L) 12/31/2023   MCV 87.1 12/31/2023   PLT 175 12/31/2023      Chemistry      Component Value Date/Time   NA 136 12/31/2023 1454   NA 140 03/04/2021 1516   K 4.6 12/31/2023 1454   CL 104 12/31/2023 1454   CO2 27 12/31/2023 1454   BUN 30 (H) 12/31/2023 1454   BUN 21 03/04/2021 1516   CREATININE 1.22 (H) 12/31/2023 1454      Component Value Date/Time   CALCIUM  9.4 12/31/2023 1454   ALKPHOS 44 12/31/2023 1454   AST 21 12/31/2023 1454   ALT 29 12/31/2023 1454   BILITOT 0.2 12/31/2023 1454       RADIOGRAPHIC STUDIES:  CT CHEST ABDOMEN PELVIS W CONTRAST Result Date: 01/10/2024 CLINICAL DATA:  Right lung adenocarcinoma, evaluate for metastatic disease. EXAM: CT CHEST, ABDOMEN, AND PELVIS WITH CONTRAST TECHNIQUE: Multidetector CT imaging of the chest, abdomen and pelvis was performed following the standard protocol  during bolus administration of intravenous contrast. RADIATION DOSE REDUCTION: This  exam was performed according to the departmental dose-optimization program which includes automated exposure control, adjustment of the mA and/or kV according to patient size and/or use of iterative reconstruction technique. CONTRAST:  OMNIPAQUE  IOHEXOL  300 MG/ML  SOLN COMPARISON:  Chest CT Nov 06, 2023, PET-CT August 13, 2023. FINDINGS: CT CHEST FINDINGS Cardiovascular: Right-sided porta catheter tip terminates in right atrium. The heart size is normal. Atherosclerotic calcifications of coronary arteries. No pericardial fluid. Mediastinum/Nodes: No new mediastinal lymphadenopathy. Persistent fullness of the right hilum without significant enhancement likely due to post treatment/post radiation changes and treated disease . Lungs/Pleura: Stable perihilar consolidative zones of treated malignancy along the central aspect of right upper, middle and lower lobes are stable to prior and significantly improved when compared to prior PET-CT from August 13, 2023. There is traction bronchiectasis and peribronchial wall thickening. Peri lymphatic and perifissural nodules are significantly decreased when compared to prior PET-CT and stable to recent CT consistent with response to treatment. Pleural thickening/residual trace effusion is persistent and stable. Left lung base subpleural nodule is smaller currently measuring 6 mm versus previously measured 1 cm. (4/118) No new pulmonary nodule. There is narrowing of right upper lobe pulmonary artery branch likely due to post treatment fibrotic changes. Musculoskeletal: Unremarkable CT ABDOMEN PELVIS FINDINGS Hepatobiliary: No focal liver abnormality is seen. No gallstones, gallbladder wall thickening, or biliary dilatation. Pancreas: Unremarkable. No pancreatic ductal dilatation or surrounding inflammatory changes. Spleen: Normal in size without focal abnormality. Adrenals/Urinary Tract:  Adrenal glands are unremarkable. Small right kidney simple cortical cysts which does not require imaging follow-up. Otherwise kidneys are normal, without renal calculi, or hydronephrosis. Bladder is unremarkable. Stomach/Bowel: Stomach is within normal limits. Appendix appears normal. Sigmoid diverticula without diverticulitis. No evidence of bowel wall thickening, distention, or inflammatory changes. Vascular/Lymphatic: No significant vascular findings are present. No enlarged abdominal or pelvic lymph nodes. Reproductive: Hysterectomy.  No adnexal mass Other: Small fat containing umbilical hernia. Musculoskeletal: Midthoracic spine compression deformities of superior endplates. No suspicious osseous lesion to suggest metastatic disease. L5-S1 grade 1 anterolisthesis and degenerative disc disease. IMPRESSION: Right lung malignancy/post treatment changes, and perihilar soft tissue, and right pleural thickening/trace effusion stable to prior CT and significantly improved to prior PET-CT from August 13, 2023. Interval decrease in size of a left lower lobe subpleural nodule. No new pulmonary nodule. Follow-up according to oncologic protocols. No suspicious finding to suggest new distant metastasis. Electronically Signed   By: Megan  Zare M.D.   On: 01/10/2024 15:53     ASSESSMENT/PLAN:  This is a very pleasant 73 year old never smoker African-American female diagnosed with stage IV non-small cell lung cancer, adenocarcinoma.  She was positive for an EGFR mutation with deletion in exon 19.  She was diagnosed in July 2019 and presented with right upper lobe lung mass in addition to mediastinal lymphadenopathy as well as bilateral pulmonary nodules and malignant right pleural effusion.   The patient was started on treatment with Tagrisso  80 mg p.o. daily status post 39 months of treatment. Started on 01/29/2018.  In April 2022, she showed evidence of disease progression with interval progression of the right  apical lung mass in addition to progression of mediastinal lymphadenopathy concerning for worsening of her disease. She had repeat Guardant 360 molecular studies which did not show evidence of new resistant mutations.  Therefore, she was referred to radiation oncology and completed palliative radiotherapy to the enlarging right upper lobe lung mass and mediastinum under the care of Dr. Shannon.  This was completed in  July 2022.    The patient underwent a repeat bronchoscopy and biopsy for repeat molecular testing.  The sample from the left and right lung biopsy was negative for malignancy. This was performed in November 2022.   Unfortunately, the patient  was found to have evidence of disease progression.  Therefore, Dr. Sherrod started the patient on systemic chemotherapy with carboplatin  for AUC of 5 and Alimta  500 mg per metered squared.  She is status post 23 cycles and she tolerated it fairly well despite her ongoing issues with fatigue, chest tightness, cough, and weight loss.  Alimta  was reduced to 400 mg per metered squared due to renal insufficiency.  She is not a good candidate for Avastin due to her recent CVA in August 2022.  She also is continuing to take Tagrisso  as it is protective against progressive metastatic disease to the brain. Starting from cycle #7,  she started maintenance single agent alimta . Her treatment has been on hold since March 2024 due to side effects of treatment.    Of note, the patient saw Dr. Valentino from [facility] for second opinion in November 2022.  They discussed if she progresses on chemotherapy that there may be upcoming options. He discussed other treatment options which may be available in 2023 including patritumab deruxtcan and lazertinib/amivantmab.   Her most recent restaging CT scan showed disease progression with new and increasing septal thickening and perilymphatic nodularity throughout the right hemithorax suspicious for lymphangitic spread of  tumor.  A PET scan to confirm this.   Therefore, at her appointment on 08/23/2023 we discussed the options.  She had repeat molecular studies performed which did not show any new actionable mutations.  Therefore the plan will be to proceed with single agent Alimta  IV every 3 weeks as scheduled.  The alternative options that we discussed was adding carboplatin  to the Alimta .  We also talked about amivantamab and lazertinib, but the patient and Dr. Sherrod have concerns about quality of life and side effect profile, although this likely would be effective   The patient opted to proceed on single agent chemotherapy with Alimta  400 mg/m.  She started this on 09/11/2023.  She tolerated this fairly well. She is status post 6 cycles of treatment.   She looks much improved since she starting treatment.   The patient was seen with Dr. Sherrod today.  Dr. Sherrod personally and independently reviewed the scan and discussed results with the patient today.  The scan showed no evidence of disease progression.  The patient asked about a break from treatment.  We discussed the risks of a break from treatment.  Overall the patient is tolerating treatment well except for mild fatigue a few days following treatment.  Dr. Sherrod recommends he continue on the same treatment at the same dose.  She is on a reduced dose of Alimta .    Labs were reviewed.  Recommend that she proceed with cycle 7 today scheduled.    We will see her back for follow-up visit in 3 weeks for evaluation review blood work before undergoing cycle #8.  She will continue taking prednisone  10 mg daily.  I did let her know previously that it may not be a bad idea to reduce her dose to 7.5 mg since her cough is improved to be on the lowest effective dose of prednisone  as the steroids may be contributing to the glaucoma and the increased reflux.   She will take pepcid  for her reflux.   She will  continue to follow with palliative care for pain management.   Her pain is significantly improved at this time.    She will continue lovenox  for her history of recurrent DVTs.  He is currently taking this once a day.    Anxiety Anxiety managed with nightly Xanax .  She will continue using her clindamycin  lotion for her fingers.  The patient was advised to call immediately if she has any concerning symptoms in the interval. The patient voices understanding of current disease status and treatment options and is in agreement with the current care plan. All questions were answered. The patient knows to call the clinic with any problems, questions or concerns. We can certainly see the patient much sooner if necessary   No orders of the defined types were placed in this encounter.     Rakeisha Nyce L Zafirah Vanzee, PA-C 01/16/24  ADDENDUM: Hematology/Oncology Attending: I had a face-to-face encounter with the patient today.  I reviewed her records, lab, scan and recommended her care plan.  This is a very pleasant 73 years old white female with a stage IV non-small cell lung cancer, adenocarcinoma diagnosed in July 2019 was positive for EGFR mutation with deletion in exon 19.  She is status post several treatment in the past and currently on maintenance treatment with Alimta  in addition to Tagrisso .  She has been tolerating this treatment fairly well except for fatigue. She had repeat CT scan of the chest, abdomen and pelvis performed recently.  I personally and independently reviewed the scan and discussed the results with the patient and her family. Her scan showed no concerning findings for disease progression. I recommended for the patient to continue her current treatment with maintenance Alimta  and Tagrisso . She will come back for follow-up visit in 3 weeks for evaluation before the next cycle of her treatment. The patient was advised to call immediately if she has any other concerning symptoms in the interval. The total time spent in the appointment was  30 minutes including review of chart and various tests results, discussions about plan of care and coordination of care plan . Disclaimer: This note was dictated with voice recognition software. Similar sounding words can inadvertently be transcribed and may be missed upon review. Sherrod MARLA Sherrod, MD

## 2024-01-21 ENCOUNTER — Inpatient Hospital Stay

## 2024-01-21 ENCOUNTER — Inpatient Hospital Stay: Admitting: Nurse Practitioner

## 2024-01-21 ENCOUNTER — Encounter: Payer: Self-pay | Admitting: Nurse Practitioner

## 2024-01-21 ENCOUNTER — Encounter: Payer: Self-pay | Admitting: Internal Medicine

## 2024-01-21 ENCOUNTER — Inpatient Hospital Stay: Attending: Internal Medicine

## 2024-01-21 ENCOUNTER — Other Ambulatory Visit (HOSPITAL_COMMUNITY): Payer: Self-pay

## 2024-01-21 ENCOUNTER — Inpatient Hospital Stay (HOSPITAL_BASED_OUTPATIENT_CLINIC_OR_DEPARTMENT_OTHER): Attending: Internal Medicine | Admitting: Physician Assistant

## 2024-01-21 ENCOUNTER — Encounter: Payer: Self-pay | Admitting: Physician Assistant

## 2024-01-21 VITALS — BP 144/64 | HR 65 | Temp 98.3°F | Resp 18 | Ht 64.0 in | Wt 129.0 lb

## 2024-01-21 DIAGNOSIS — Z5111 Encounter for antineoplastic chemotherapy: Secondary | ICD-10-CM

## 2024-01-21 DIAGNOSIS — K5903 Drug induced constipation: Secondary | ICD-10-CM | POA: Diagnosis not present

## 2024-01-21 DIAGNOSIS — Z79899 Other long term (current) drug therapy: Secondary | ICD-10-CM | POA: Diagnosis not present

## 2024-01-21 DIAGNOSIS — K219 Gastro-esophageal reflux disease without esophagitis: Secondary | ICD-10-CM | POA: Diagnosis not present

## 2024-01-21 DIAGNOSIS — Z7901 Long term (current) use of anticoagulants: Secondary | ICD-10-CM | POA: Diagnosis not present

## 2024-01-21 DIAGNOSIS — C3490 Malignant neoplasm of unspecified part of unspecified bronchus or lung: Secondary | ICD-10-CM | POA: Diagnosis not present

## 2024-01-21 DIAGNOSIS — G893 Neoplasm related pain (acute) (chronic): Secondary | ICD-10-CM | POA: Insufficient documentation

## 2024-01-21 DIAGNOSIS — Z515 Encounter for palliative care: Secondary | ICD-10-CM | POA: Diagnosis not present

## 2024-01-21 DIAGNOSIS — Z7952 Long term (current) use of systemic steroids: Secondary | ICD-10-CM | POA: Diagnosis not present

## 2024-01-21 DIAGNOSIS — Z86718 Personal history of other venous thrombosis and embolism: Secondary | ICD-10-CM | POA: Insufficient documentation

## 2024-01-21 DIAGNOSIS — F419 Anxiety disorder, unspecified: Secondary | ICD-10-CM | POA: Insufficient documentation

## 2024-01-21 DIAGNOSIS — C3491 Malignant neoplasm of unspecified part of right bronchus or lung: Secondary | ICD-10-CM

## 2024-01-21 DIAGNOSIS — Z95828 Presence of other vascular implants and grafts: Secondary | ICD-10-CM

## 2024-01-21 DIAGNOSIS — Z8673 Personal history of transient ischemic attack (TIA), and cerebral infarction without residual deficits: Secondary | ICD-10-CM | POA: Insufficient documentation

## 2024-01-21 DIAGNOSIS — C3411 Malignant neoplasm of upper lobe, right bronchus or lung: Secondary | ICD-10-CM | POA: Diagnosis present

## 2024-01-21 LAB — CMP (CANCER CENTER ONLY)
ALT: 34 U/L (ref 0–44)
AST: 25 U/L (ref 15–41)
Albumin: 3.8 g/dL (ref 3.5–5.0)
Alkaline Phosphatase: 45 U/L (ref 38–126)
Anion gap: 7 (ref 5–15)
BUN: 21 mg/dL (ref 8–23)
CO2: 25 mmol/L (ref 22–32)
Calcium: 8.9 mg/dL (ref 8.9–10.3)
Chloride: 104 mmol/L (ref 98–111)
Creatinine: 1.28 mg/dL — ABNORMAL HIGH (ref 0.44–1.00)
GFR, Estimated: 45 mL/min — ABNORMAL LOW (ref >60)
Glucose, Bld: 180 mg/dL — ABNORMAL HIGH (ref 70–99)
Potassium: 3.7 mmol/L (ref 3.5–5.1)
Sodium: 136 mmol/L (ref 135–145)
Total Bilirubin: 0.3 mg/dL (ref 0.0–1.2)
Total Protein: 6.4 g/dL — ABNORMAL LOW (ref 6.5–8.1)

## 2024-01-21 LAB — CBC WITH DIFFERENTIAL/PLATELET
Abs Immature Granulocytes: 0.03 K/uL (ref 0.00–0.07)
Basophils Absolute: 0 K/uL (ref 0.0–0.1)
Basophils Relative: 0 %
Eosinophils Absolute: 0 K/uL (ref 0.0–0.5)
Eosinophils Relative: 0 %
HCT: 35.6 % — ABNORMAL LOW (ref 36.0–46.0)
Hemoglobin: 11.4 g/dL — ABNORMAL LOW (ref 12.0–15.0)
Immature Granulocytes: 1 %
Lymphocytes Relative: 13 %
Lymphs Abs: 0.7 K/uL (ref 0.7–4.0)
MCH: 28 pg (ref 26.0–34.0)
MCHC: 32 g/dL (ref 30.0–36.0)
MCV: 87.5 fL (ref 80.0–100.0)
Monocytes Absolute: 0.2 K/uL (ref 0.1–1.0)
Monocytes Relative: 4 %
Neutro Abs: 4.8 K/uL (ref 1.7–7.7)
Neutrophils Relative %: 82 %
Platelets: 184 K/uL (ref 150–400)
RBC: 4.07 MIL/uL (ref 3.87–5.11)
RDW: 14.6 % (ref 11.5–15.5)
WBC: 5.8 K/uL (ref 4.0–10.5)
nRBC: 0 % (ref 0.0–0.2)

## 2024-01-21 MED ORDER — FENTANYL 12 MCG/HR TD PT72
1.0000 | MEDICATED_PATCH | TRANSDERMAL | 0 refills | Status: DC
  Filled 2024-01-21: qty 10, 30d supply, fill #0

## 2024-01-21 MED ORDER — SODIUM CHLORIDE 0.9 % IV SOLN
INTRAVENOUS | Status: DC
Start: 2024-01-21 — End: 2024-01-21

## 2024-01-21 MED ORDER — CYANOCOBALAMIN 1000 MCG/ML IJ SOLN
1000.0000 ug | Freq: Once | INTRAMUSCULAR | Status: DC
Start: 2024-01-21 — End: 2024-01-21

## 2024-01-21 MED ORDER — PROCHLORPERAZINE MALEATE 10 MG PO TABS
10.0000 mg | ORAL_TABLET | Freq: Once | ORAL | Status: AC
Start: 2024-01-21 — End: 2024-01-21
  Administered 2024-01-21: 10 mg via ORAL
  Filled 2024-01-21: qty 1

## 2024-01-21 MED ORDER — SODIUM CHLORIDE 0.9% FLUSH
10.0000 mL | Freq: Once | INTRAVENOUS | Status: AC
Start: 2024-01-21 — End: 2024-01-21
  Administered 2024-01-21: 10 mL

## 2024-01-21 MED ORDER — SODIUM CHLORIDE 0.9 % IV SOLN
400.0000 mg/m2 | Freq: Once | INTRAVENOUS | Status: AC
  Administered 2024-01-21: 600 mg via INTRAVENOUS
  Filled 2024-01-21: qty 20

## 2024-01-21 NOTE — Progress Notes (Signed)
 Palliative Medicine [facility]  [phone] [phone]   Name: Allison [name] Date: 01/21/2024 MRN: [id number]  DOB: [birth year]  Patient Care Team: [name], MD as PCP - General (Internal Medicine) Kate Lonni LITTIE, MD as PCP - Cardiology (Cardiology) Shannon Agent, MD as Consulting Physician (Radiation Oncology) Pickenpack-Cousar, Fannie SAILOR, NP as Nurse Practitioner (Nurse Practitioner) Evertt Lonell BROCKS, RN as Oncology Nurse Navigator (Oncology) Sherrod Sherrod, MD as Consulting Physician (Oncology) Cloria Annabella LITTIE, DO (Geriatric Medicine) Prentis Duwaine BROCKS, RN as Oncology Nurse Navigator    INTERVAL HISTORY:Allison [name] is a 73 y.o. female with stage IV non-small cell lung cancer (12/2017), hypertension, CVA, CAD, DVT/PE (on Lovenox ), and GERD.  Palliative ask to see for symptom management and goals of care.   SOCIAL HISTORY:     reports that she has never smoked. She has been exposed to tobacco smoke. She has never used smokeless tobacco. She reports that she does not currently use alcohol. She reports that she does not use drugs.  ADVANCE DIRECTIVES:  MOST and AD on file   CODE STATUS: DNR  PAST MEDICAL HISTORY: Past Medical History:  Diagnosis Date   Anemia    Anxiety    Arthritis    Asthma    exercise induced   Coronary artery disease    Depression    PMH   Dyspnea    GERD (gastroesophageal reflux disease)    Glaucoma    History of radiation therapy 01/05/2021   IMRT right lung  11/24/2020-01/05/2021  Dr Agent Shannon   Hypertension    lung ca dx'd 11/2017   right   Malignant pleural effusion    right   Nuclear sclerotic cataract of right eye 02/28/2021   Dr. Lonni Gaudy, cataract surgery February 2023   PONV (postoperative nausea and vomiting)    Pre-diabetes    Raynaud's disease    Raynaud's disease    Stroke (HCC) 01/2021   balance off, some express aphasia, weakness    ALLERGIES:  is  allergic to hydrocodone -acetaminophen , other, penicillins, lactose, vicodin [hydrocodone -acetaminophen ], bacid, and ipratropium-albuterol .  MEDICATIONS:  Current Outpatient Medications  Medication Sig Dispense Refill   ALPRAZolam  (XANAX ) 0.25 MG tablet Take 1 tablet (0.25 mg total) by mouth 2 (two) times daily as needed for anxiety. May take 2 tablets (0.5 mg total) at bedtime. (Patient taking differently: Take 0.25-0.5 mg by mouth 2 (two) times daily as needed for anxiety or sleep.) 60 tablet 0   ALPRAZolam  (XANAX ) 0.5 MG tablet Take 1 tablet (0.5 mg total) by mouth 3 (three) times daily as needed. 90 tablet 3   amLODipine  (NORVASC ) 5 MG tablet Take 1 tablet (5 mg total) by mouth daily. (Patient not taking: Reported on 12/04/2023) 90 tablet 3   carvedilol  (COREG ) 6.25 MG tablet Take 1 tablet (6.25 mg total) by mouth 2 (two) times daily. 180 tablet 3   dexamethasone  (DECADRON ) 4 MG tablet Take 1 tablet twice a day the day before, day of, and day after treatment (Patient not taking: Reported on 12/04/2023) 40 tablet 2   dorzolamide -timolol  (COSOPT ) 22.3-6.8 MG/ML ophthalmic solution Place 1 drop into both eyes 2 (two) times daily.     doxycycline  (VIBRA -TABS) 100 MG tablet Take 1 tablet (100 mg total) by mouth 2 (two) times daily. 14 tablet 0   enoxaparin  (LOVENOX ) 60 MG/0.6ML injection Inject 0.55 mLs (55 mg total) into the skin every 12 (twelve) hours. 100 mL 1   ezetimibe  (ZETIA ) 10 MG  tablet Take 10 mg by mouth daily.     famotidine  (PEPCID ) 20 MG tablet TAKE 1 TABLET BY MOUTH TWICE A DAY 180 tablet 1   FeFum-FePoly-FA-B Cmp-C-Biot (FOLIVANE-PLUS) CAPS Take 1 capsule by mouth in the morning. 90 capsule 0   fentaNYL  (DURAGESIC ) 12 MCG/HR Place 1 patch onto the skin every 3 (three) days. 10 patch 0   FLUoxetine  (PROZAC ) 20 MG capsule Take 1 capsule (20 mg total) by mouth daily. 90 capsule 3   folic acid  (FOLVITE ) 1 MG tablet TAKE 1 TABLET BY MOUTH EVERY DAY 90 tablet 1   gabapentin  (NEURONTIN ) 250  MG/5ML solution Take 4 mLs (200 mg total) by mouth at bedtime. 470 mL 1   Glycopyrrolate -Formoterol  (BEVESPI  AEROSPHERE) 9-4.8 MCG/ACT AERO Inhale 2 puffs into the lungs 2 (two) times daily. 10.7 g 3   Glycopyrrolate -Formoterol  (BEVESPI  AEROSPHERE) 9-4.8 MCG/ACT AERO Inhale 2 puffs into the lungs 2 (two) times daily. 3 each 3   HYDROcodone  bit-homatropine (HYCODAN) 5-1.5 MG/5ML syrup Take 5 mLs by mouth every 6 (six) hours as needed for cough. 240 mL 0   hyoscyamine  (LEVSIN  SL) 0.125 MG SL tablet Place 1-2 tablets (0.125-0.25mg ) under the tongue every 6 (six) hours as needed for cramping. 30 tablet 3   isosorbide  mononitrate (IMDUR ) 30 MG 24 hr tablet Take 1 tablet (30 mg total) by mouth daily. 90 tablet 3   latanoprost  (XALATAN ) 0.005 % ophthalmic solution Place 1 drop into both eyes at bedtime.      lidocaine -prilocaine  (EMLA ) cream Apply 1 Application topically as needed. 30 g 0   ondansetron  (ZOFRAN -ODT) 4 MG disintegrating tablet Take 1 tablet (4 mg total) by mouth every 8 (eight) hours as needed for nausea or vomiting. 30 tablet 1   osimertinib  mesylate (TAGRISSO ) 80 MG tablet Take 1 tablet (80 mg total) by mouth daily. 30 tablet 4   oxyCODONE  (OXY IR/ROXICODONE ) 5 MG immediate release tablet Take 1-2 tablets (5-10 mg total) by mouth every 4 (four) hours as needed for severe pain (pain score 7-10) or breakthrough pain. 60 tablet 0   predniSONE  (DELTASONE ) 10 MG tablet Take 1 tablet (10 mg total) by mouth daily with breakfast. 30 tablet 2   prochlorperazine  (COMPAZINE ) 10 MG tablet Take 1 tablet (10 mg total) by mouth every 6 (six) hours as needed. 30 tablet 2   REPATHA  SURECLICK 140 MG/ML SOAJ Inject 140 mg into the skin every 14 (fourteen) days. 6 mL 3   senna-docusate (SENNA S) 8.6-50 MG tablet Take 2 tablets by mouth at bedtime. 60 tablet 3   Wheat Dextrin (BENEFIBER DRINK MIX) PACK Take 1 packet by mouth daily.     No current facility-administered medications for this visit.    VITAL  SIGNS: LMP  (LMP Unknown)  There were no vitals filed for this visit.  Estimated body mass index is 22.14 kg/m as calculated from the following:   Height as of an earlier encounter on 01/21/24: 5' 4 (1.626 m).   Weight as of an earlier encounter on 01/21/24: 129 lb (58.5 kg).   PERFORMANCE STATUS (ECOG) : 2 - Symptomatic, <50% confined to bed   Physical Exam General: NAD Cardiovascular: regular rate and rhythm Pulmonary: normal breathing pattern  Extremities: no edema, no joint deformities Skin: no rashes Neurological: AAO x3  IMPRESSION: Discussed the use of AI scribe software for clinical note transcription with the patient, who gave verbal consent to proceed.  History of Present Illness RAYLEY GAO is a 73 year old female who was seen  during infusion for symptom management follow-up. Tolerating without difficulty. She is accompanied by her Significant Other, [name]. Doing well overall. Denies concerns of nausea, vomiting, constipation, or diarrhea that is uncontrolled. Reports her bowel movements are regular.  Appetite continues to improve. Weight is up to 129lbs from 125lbs.   Ms. Custer shares persistent chest wall discomfort at times. Reports pain and discomfort remains well controlled with use of fentanyl  patch. Does not require daily use of breakthrough medication. Her current regimen consiste of fentanyl  patch 15mcg and oxycodone  5-10mg  as needed. No adjustments to current regimen.   All questions answered and support provided.   Goals of Care Ms. Ulrich and family continue to remain hopeful for stability with treatment.  Her goal is to continue to treat the treatable allow her every opportunity to continue to thrive while aggressively managing her symptoms.  Her quality of life is most important to her and her family.  I discussed the importance of continued conversation with family and their medical providers regarding overall plan of care and treatment options,  ensuring decisions are within the context of the patients values and GOCs. Assessment & Plan Cancer related pain Fentanyl  patch and oxycodone  used for pain control. Pain well controlled. Does not require daily use of breakthrough medication. No adjustments.  - Continue oxycodone  5-10 mg every 4-6 hours as needed. - Fentanyl  12.5 mcg patch for long-acting pain relief, change every three days. -Continue Xanax  as needed for anxiety. -Will continue to closely monitor and adjust regimen as needed  Constipation Much improved with use of Senna.  -Continue Senna-S 2 tablets at bedtime.   Follow-up I will plan to see patient back in 4-6 weeks. Sooner if needed.   Patient expressed understanding and was in agreement with this plan. She also understands that She can call the clinic at any time with any questions, concerns, or complaints.   Any controlled substances utilized were prescribed in the context of palliative care. PDMP has been reviewed.   Visit consisted of counseling and education dealing with the complex and emotionally intense issues of symptom management and palliative care in the setting of serious and potentially life-threatening illness.  Levon Borer, AGPCNP-BC  Palliative Medicine Team/[facility]

## 2024-01-21 NOTE — Patient Instructions (Signed)
 [facility] - A DEPT OF [facility]  Discharge Instructions: Thank you for choosing [facility] to provide your oncology and hematology care.   If you have a lab appointment with the Cancer Center, please go directly to the Cancer Center and check in at the registration area.   Wear comfortable clothing and clothing appropriate for easy access to any Portacath or PICC line.   We strive to give you quality time with your provider. You may need to reschedule your appointment if you arrive late (15 or more minutes).  Arriving late affects you and other patients whose appointments are after yours.  Also, if you miss three or more appointments without notifying the office, you may be dismissed from the clinic at the provider's discretion.      For prescription refill requests, have your pharmacy contact our office and allow 72 hours for refills to be completed.    Today you received the following chemotherapy and/or immunotherapy agents Alimta      To help prevent nausea and vomiting after your treatment, we encourage you to take your nausea medication as directed.  BELOW ARE SYMPTOMS THAT SHOULD BE REPORTED IMMEDIATELY: *FEVER GREATER THAN 100.4 F (38 C) OR HIGHER *CHILLS OR SWEATING *NAUSEA AND VOMITING THAT IS NOT CONTROLLED WITH YOUR NAUSEA MEDICATION *UNUSUAL SHORTNESS OF BREATH *UNUSUAL BRUISING OR BLEEDING *URINARY PROBLEMS (pain or burning when urinating, or frequent urination) *BOWEL PROBLEMS (unusual diarrhea, constipation, pain near the anus) TENDERNESS IN MOUTH AND THROAT WITH OR WITHOUT PRESENCE OF ULCERS (sore throat, sores in mouth, or a toothache) UNUSUAL RASH, SWELLING OR PAIN  UNUSUAL VAGINAL DISCHARGE OR ITCHING   Items with * indicate a potential emergency and should be followed up as soon as possible or go to the Emergency Department if any problems should occur.  Please show the CHEMOTHERAPY ALERT CARD or IMMUNOTHERAPY  ALERT CARD at check-in to the Emergency Department and triage nurse.  Should you have questions after your visit or need to cancel or reschedule your appointment, please contact [facility] - A DEPT OF [facility]  Dept: [phone]  and follow the prompts.  Office hours are 8:00 a.m. to 4:30 p.m. Monday - Friday. Please note that voicemails left after 4:00 p.m. may not be returned until the following business day.  We are closed weekends and major holidays. You have access to a nurse at all times for urgent questions. Please call the main number to the clinic Dept: [phone] and follow the prompts.   For any non-urgent questions, you may also contact your provider using MyChart. We now offer e-Visits for anyone 79 and older to request care online for non-urgent symptoms. For details visit [email or web address].   Also download the MyChart app! Go to the app store, search "MyChart", open the app, select [place], and log in with your MyChart username and password.

## 2024-01-22 ENCOUNTER — Other Ambulatory Visit (HOSPITAL_COMMUNITY): Payer: Self-pay

## 2024-01-23 ENCOUNTER — Telehealth: Payer: Self-pay | Admitting: Nurse Practitioner

## 2024-01-23 ENCOUNTER — Other Ambulatory Visit (HOSPITAL_COMMUNITY): Payer: Self-pay

## 2024-01-23 ENCOUNTER — Encounter: Payer: Self-pay | Admitting: Internal Medicine

## 2024-01-23 ENCOUNTER — Encounter: Payer: Self-pay | Admitting: Physician Assistant

## 2024-01-23 NOTE — Telephone Encounter (Signed)
 Scheduled appointments per 8/4 los. Talked with the patient and she is aware of the made appointments.

## 2024-02-06 NOTE — Progress Notes (Signed)
 [facility] OFFICE PROGRESS NOTE  Allison Iha, MD [address] [place] [address]  DIAGNOSIS: Stage IV (T2 a,N2, M1a) non-small cell lung cancer, adenocarcinoma diagnosed in July 2019 and presented with right upper lobe lung mass in addition to mediastinal lymphadenopathy as well as bilateral pulmonary nodules and malignant right pleural effusion.   Biomarker Findings Microsatellite status - Cannot Be Determined Tumor Mutational Burden - Cannot Be Determined Genomic Findings For a complete list of the genes assayed, please refer to the Appendix. EGFR exon 19 deletion ([id number]) TP53 Y220C 7 Disease relevant genes with no reportable alterations: KRAS, ALK, BRAF, MET, RET, ERBB2, ROS1    Repeat molecular studies in 2025 showed no new mutations  PRIOR THERAPY: 1) Status post right Pleurx catheter placement by Dr. Fleeta Ochoa for drainage of malignant right pleural effusion. 2) palliative radiotherapy to the enlarging right upper lobe lung mass and mediastinum under the care of Dr. Shannon expected to be completed on January 05, 2021. 3) Systemic chemotherapy with carboplatin  for AUC of 5 and Alimta  500 Mg/M2 every 3 weeks.  First dose 03/17/2021.  The patient will also continue her current treatment with Tagrisso  80 mg p.o. daily.  She is status post 26 cycles. Starting from cycle #7, she will be on Alimta  only 400 mg/m2.  Her treatment with Alimta  has been on hold for several months now.   CURRENT THERAPY: Tagrisso  80 mg p.o. daily.  First dose was given on 01/29/2018.  Status post 72.5 months of treatment.   IV chemotherapy with alimta  400 mg/m2 IV every 3 weeks to start around 09/11/23. Status post 7 cycles.   INTERVAL HISTORY: Allison [name] 73 y.o. female returns to the clinic today for a follow-up visit accompanied by her significant other. In summary, the patient was found to have disease progression in early 2025. Therefore, she restarted systemic  chemotherapy with single agent Alimta  dose reduced to 400 mg/m for tolerability.  She is status post 6 cycles of treatment and tolerated it well overall. Her most recent CT scan showed a positive response to treatment. Symptomatically, she had significant improvement in her cough since starting treatment. Before treatment, she hardly could talk without coughing. She is on 10 mg of prednisone .   At pulmonary they talked about reducing her dose of prednisone  to 7.5. We re-discussed this today as she is having some facial swelling and her cough is controlled. She is agreeable. She takes pepcid  for reflux.   She experiences worsening facial pain, described as an achy, deep pain that is debilitating and requires oxycodone  for relief. The pain is bilateral, sometimes extending down her neck, and occurs two to three times daily, lasting about ten to fifteen minutes after taking oxycodone . There is no specific trigger, and it can occur at rest. She is not taking xanax  for anxiety at this time.   She denies any fevers or chills.  She experiences her normal night sweats.  She reports her appetite is decreased a few days following treatment but then she has a strong appetite the week after treatment and she is able to eat what she wants.  She did not lose any weight.  She has dyspnea on exertion.  Now that the weather is colder she is thinking of contacting pulmonary medicine to consider pulmonary rehab.  She denies any significant cough and her cough is controlled.  Denies any hemoptysis.  She experiences ongoing chest tightness for which she is followed by palliative care  and takes oxycodone  and has a fentanyl  patch.  She has some queasiness but denies any vomiting.  She will use ginger to help with her queasiness.  She denies any constipation.  She has intermittent diarrhea secondary to her Tagrisso  but nothing out of the ordinary.  She denies any headaches.  She is here today for evaluation repeat blood work before  undergoing her next cycle of treatment.   She is here today for evaluation repeat blood work before undergoing cycle #8.  MEDICAL HISTORY: Past Medical History:  Diagnosis Date   Anemia    Anxiety    Arthritis    Asthma    exercise induced   Coronary artery disease    Depression    PMH   Dyspnea    GERD (gastroesophageal reflux disease)    Glaucoma    History of radiation therapy 01/05/2021   IMRT right lung  11/24/2020-01/05/2021  Dr Lynwood Nasuti   Hypertension    lung ca dx'd 11/2017   right   Malignant pleural effusion    right   Nuclear sclerotic cataract of right eye 02/28/2021   Dr. Lonni Gaudy, cataract surgery February 2023   PONV (postoperative nausea and vomiting)    Pre-diabetes    Raynaud's disease    Raynaud's disease    Stroke (HCC) 01/2021   balance off, some express aphasia, weakness    ALLERGIES:  is allergic to hydrocodone -acetaminophen , other, penicillins, lactose, vicodin [hydrocodone -acetaminophen ], bacid, and ipratropium-albuterol .  MEDICATIONS:  Current Outpatient Medications  Medication Sig Dispense Refill   ALPRAZolam  (XANAX ) 0.25 MG tablet Take 1 tablet (0.25 mg total) by mouth 2 (two) times daily as needed for anxiety. May take 2 tablets (0.5 mg total) at bedtime. (Patient taking differently: Take 0.25-0.5 mg by mouth 2 (two) times daily as needed for anxiety or sleep.) 60 tablet 0   ALPRAZolam  (XANAX ) 0.5 MG tablet Take 1 tablet (0.5 mg total) by mouth 3 (three) times daily as needed. 90 tablet 3   amLODipine  (NORVASC ) 5 MG tablet Take 1 tablet (5 mg total) by mouth daily. 90 tablet 3   carvedilol  (COREG ) 6.25 MG tablet Take 1 tablet (6.25 mg total) by mouth 2 (two) times daily. 180 tablet 3   dexamethasone  (DECADRON ) 4 MG tablet Take 1 tablet twice a day the day before, day of, and day after treatment 40 tablet 2   dorzolamide -timolol  (COSOPT ) 22.3-6.8 MG/ML ophthalmic solution Place 1 drop into both eyes 2 (two) times daily.      doxycycline  (VIBRA -TABS) 100 MG tablet Take 1 tablet (100 mg total) by mouth 2 (two) times daily. 14 tablet 0   enoxaparin  (LOVENOX ) 60 MG/0.6ML injection Inject 0.55 mLs (55 mg total) into the skin every 12 (twelve) hours. 100 mL 1   ezetimibe  (ZETIA ) 10 MG tablet Take 10 mg by mouth daily.     famotidine  (PEPCID ) 20 MG tablet TAKE 1 TABLET BY MOUTH TWICE A DAY 180 tablet 1   FeFum-FePoly-FA-B Cmp-C-Biot (FOLIVANE-PLUS) CAPS Take 1 capsule by mouth in the morning. 90 capsule 0   fentaNYL  (DURAGESIC ) 12 MCG/HR Place 1 patch onto the skin every 3 (three) days. 10 patch 0   FLUoxetine  (PROZAC ) 20 MG capsule Take 1 capsule (20 mg total) by mouth daily. 90 capsule 3   folic acid  (FOLVITE ) 1 MG tablet TAKE 1 TABLET BY MOUTH EVERY DAY 90 tablet 1   gabapentin  (NEURONTIN ) 250 MG/5ML solution Take 4 mLs (200 mg total) by mouth at bedtime. 470 mL 1  Glycopyrrolate -Formoterol  (BEVESPI  AEROSPHERE) 9-4.8 MCG/ACT AERO Inhale 2 puffs into the lungs 2 (two) times daily. 10.7 g 3   Glycopyrrolate -Formoterol  (BEVESPI  AEROSPHERE) 9-4.8 MCG/ACT AERO Inhale 2 puffs into the lungs 2 (two) times daily. 3 each 3   HYDROcodone  bit-homatropine (HYCODAN) 5-1.5 MG/5ML syrup Take 5 mLs by mouth every 6 (six) hours as needed for cough. 240 mL 0   hyoscyamine  (LEVSIN  SL) 0.125 MG SL tablet Place 1-2 tablets (0.125-0.25mg ) under the tongue every 6 (six) hours as needed for cramping. 30 tablet 3   isosorbide  mononitrate (IMDUR ) 30 MG 24 hr tablet Take 1 tablet (30 mg total) by mouth daily. 90 tablet 3   latanoprost  (XALATAN ) 0.005 % ophthalmic solution Place 1 drop into both eyes at bedtime.      lidocaine -prilocaine  (EMLA ) cream Apply 1 Application topically as needed. 30 g 0   ondansetron  (ZOFRAN -ODT) 4 MG disintegrating tablet Take 1 tablet (4 mg total) by mouth every 8 (eight) hours as needed for nausea or vomiting. 30 tablet 1   osimertinib  mesylate (TAGRISSO ) 80 MG tablet Take 1 tablet (80 mg total) by mouth daily. 30  tablet 4   oxyCODONE  (OXY IR/ROXICODONE ) 5 MG immediate release tablet Take 1-2 tablets (5-10 mg total) by mouth every 4 (four) hours as needed for severe pain (pain score 7-10) or breakthrough pain. 60 tablet 0   predniSONE  (DELTASONE ) 5 MG tablet Take 1.5 tablets (7.5 mg total) by mouth daily with breakfast. 45 tablet 0   prochlorperazine  (COMPAZINE ) 10 MG tablet Take 1 tablet (10 mg total) by mouth every 6 (six) hours as needed. 30 tablet 2   REPATHA  SURECLICK 140 MG/ML SOAJ Inject 140 mg into the skin every 14 (fourteen) days. 6 mL 3   senna-docusate (SENNA S) 8.6-50 MG tablet Take 2 tablets by mouth at bedtime. 60 tablet 3   Wheat Dextrin (BENEFIBER DRINK MIX) PACK Take 1 packet by mouth daily.     No current facility-administered medications for this visit.    SURGICAL HISTORY:  Past Surgical History:  Procedure Laterality Date   ABDOMINAL HYSTERECTOMY     partial   BRONCHIAL BIOPSY  04/21/2021   Procedure: BRONCHIAL BIOPSIES;  Surgeon: Brenna Adine CROME, DO;  Location: [facility];  Service: Pulmonary;;   BRONCHIAL BRUSHINGS  04/21/2021   Procedure: BRONCHIAL BRUSHINGS;  Surgeon: Brenna Adine CROME, DO;  Location: [facility];  Service: Pulmonary;;   BRONCHIAL NEEDLE ASPIRATION BIOPSY  04/21/2021   Procedure: BRONCHIAL NEEDLE ASPIRATION BIOPSIES;  Surgeon: Brenna Adine CROME, DO;  Location: [facility];  Service: Pulmonary;;   CHEST TUBE INSERTION Right 01/01/2018   Procedure: INSERTION PLEURAL DRAINAGE CATHETER;  Surgeon: Fleeta Hanford Coy, MD;  Location: [facility] OR;  Service: Thoracic;  Laterality: Right;   CHEST TUBE INSERTION  04/21/2021   Procedure: CHEST TUBE INSERTION;  Surgeon: Brenna Adine CROME, DO;  Location: [facility];  Service: Pulmonary;;   COLONOSCOPY     CORONARY STENT INTERVENTION N/A 06/16/2019   Procedure: CORONARY STENT INTERVENTION;  Surgeon: Dann Candyce RAMAN, MD;  Location: [facility] INVASIVE CV LAB;  Service: Cardiovascular;  Laterality: N/A;   DILATION AND CURETTAGE OF  UTERUS     EYE SURGERY     due to Glaucoma   IR IMAGING GUIDED PORT INSERTION  03/22/2021   IR PORT REPAIR CENTRAL VENOUS ACCESS DEVICE  04/15/2021   LEFT HEART CATH AND CORONARY ANGIOGRAPHY N/A 06/16/2019   Procedure: LEFT HEART CATH AND CORONARY ANGIOGRAPHY;  Surgeon: Dann Candyce RAMAN, MD;  Location: [facility] INVASIVE  CV LAB;  Service: Cardiovascular;  Laterality: N/A;   REMOVAL OF PLEURAL DRAINAGE CATHETER Right 11/07/2018   Procedure: REMOVAL OF PLEURAL DRAINAGE CATHETER;  Surgeon: Fleeta Hanford Coy, MD;  Location: [facility] OR;  Service: Thoracic;  Laterality: Right;   ROTATOR CUFF REPAIR     TUBAL LIGATION     VIDEO BRONCHOSCOPY WITH ENDOBRONCHIAL NAVIGATION Bilateral 04/21/2021   Procedure: VIDEO BRONCHOSCOPY WITH ENDOBRONCHIAL NAVIGATION;  Surgeon: Brenna Adine CROME, DO;  Location: [facility];  Service: Pulmonary;  Laterality: Bilateral;  ION   WISDOM TOOTH EXTRACTION      REVIEW OF SYSTEMS:   Review of Systems  Constitutional: Positive for fatigue. Negative for chills and fever.  HENT: Occasional facial pain bilaterally. Negative for mouth sores, nosebleeds, sore throat and trouble swallowing.   Eyes: Negative for eye problems and icterus.  Respiratory: Positive fors significantly improved cough and stable shortness of breath with exertion. Negative for hemoptysis and wheezing.   Cardiovascular:  Chronic chest tightness. Negative for chest pain or leg swelling.  Gastrointestinal: Occasional mild diarrhea and mild nausea. Negative for abdominal pain, and vomiting.  Genitourinary: Negative for bladder incontinence, difficulty urinating, dysuria, frequency and hematuria.   Musculoskeletal: Negative for gait problem, neck pain and neck stiffness.  Skin: Mild paronychia around the nails.  Negative for itching and rash.  Neurological: Negative for dizziness, extremity weakness, gait problem, headaches, light-headedness and seizures.  Hematological: Negative for adenopathy. Does not bruise/bleed  easily.  Psychiatric/Behavioral: Negative for confusion, depression and sleep disturbance. The patient is not nervous/anxious.     PHYSICAL EXAMINATION:  Blood pressure (!) 142/62, pulse 75, temperature 98 F (36.7 C), temperature source Temporal, resp. rate 16, height 5' 4 (1.626 m), weight 131 lb 11.2 oz (59.7 kg), SpO2 100%.  ECOG PERFORMANCE STATUS: 1  Physical Exam  Constitutional: Oriented to person, place, and time and thin appearing female and in no distress.   HENT:  Head: Normocephalic and atraumatic.  Mouth/Throat: Oropharynx is clear and moist. No oropharyngeal exudate.  Eyes: Conjunctivae are normal. Right eye exhibits no discharge. Left eye exhibits no discharge. No scleral icterus.  Neck: Normal range of motion. Neck supple.  Cardiovascular: Normal rate, regular rhythm, normal heart sounds and intact distal pulses.   Pulmonary/Chest: Effort normal and breath sounds clear to auscultation. No respiratory distress. No wheezes. No rales.  Abdominal: Soft. Bowel sounds are normal. Exhibits no distension and no mass. There is no tenderness.  Musculoskeletal: Normal range of motion. Exhibits no edema.  Lymphadenopathy:    No cervical adenopathy.  Neurological: Alert and oriented to person, place, and time. Exhibits muscle wasting. Gait normal. Coordination normal. Uses a cane for ambulation.  Skin: Skin is warm and dry. No rash noted. Not diaphoretic. No erythema. No pallor.  Psychiatric: Mood, memory and judgment normal.  Vitals reviewed.  LABORATORY DATA: Lab Results  Component Value Date   WBC 8.2 02/11/2024   HGB 12.2 02/11/2024   HCT 37.4 02/11/2024   MCV 88.2 02/11/2024   PLT 189 02/11/2024      Chemistry      Component Value Date/Time   NA 136 01/21/2024 0743   NA 140 03/04/2021 1516   K 3.7 01/21/2024 0743   CL 104 01/21/2024 0743   CO2 25 01/21/2024 0743   BUN 21 01/21/2024 0743   BUN 21 03/04/2021 1516   CREATININE 1.28 (H) 01/21/2024 0743       Component Value Date/Time   CALCIUM  8.9 01/21/2024 0743   ALKPHOS 45 01/21/2024 0743  AST 25 01/21/2024 0743   ALT 34 01/21/2024 0743   BILITOT 0.3 01/21/2024 0743       RADIOGRAPHIC STUDIES:  No results found.    ASSESSMENT/PLAN:  This is a very pleasant 73 year old never smoker African-American female diagnosed with stage IV non-small cell lung cancer, adenocarcinoma.  She was positive for an EGFR mutation with deletion in exon 19.  She was diagnosed in July 2019 and presented with right upper lobe lung mass in addition to mediastinal lymphadenopathy as well as bilateral pulmonary nodules and malignant right pleural effusion.   The patient was started on treatment with Tagrisso  80 mg p.o. daily status post 39 months of treatment. Started on 01/29/2018.   In April 2022, she showed evidence of disease progression with interval progression of the right apical lung mass in addition to progression of mediastinal lymphadenopathy concerning for worsening of her disease. She had repeat Guardant 360 molecular studies which did not show evidence of new resistant mutations.  Therefore, she was referred to radiation oncology and completed palliative radiotherapy to the enlarging right upper lobe lung mass and mediastinum under the care of Dr. Shannon.  This was completed in July 2022.    The patient underwent a repeat bronchoscopy and biopsy for repeat molecular testing.  The sample from the left and right lung biopsy was negative for malignancy. This was performed in November 2022.    Unfortunately, the patient  was found to have evidence of disease progression.  Therefore, Dr. Sherrod started the patient on systemic chemotherapy with carboplatin  for AUC of 5 and Alimta  500 mg per metered squared.  She is status post 23 cycles and she tolerated it fairly well despite her ongoing issues with fatigue, chest tightness, cough, and weight loss.  Alimta  was reduced to 400 mg per metered squared due to  renal insufficiency.  She is not a good candidate for Avastin due to her recent CVA in August 2022.  She also is continuing to take Tagrisso  as it is protective against progressive metastatic disease to the brain. Starting from cycle #7,  she started maintenance single agent alimta . Her treatment has been on hold since March 2024 due to side effects of treatment.    Of note, the patient saw Dr. Valentino from [facility] for second opinion in November 2022.  They discussed if she progresses on chemotherapy that there may be upcoming options. He discussed other treatment options which may be available in 2023 including patritumab deruxtcan and lazertinib/amivantmab.    Her most recent restaging CT scan showed disease progression with new and increasing septal thickening and perilymphatic nodularity throughout the right hemithorax suspicious for lymphangitic spread of tumor.  A PET scan to confirm this.   Therefore, at her appointment on 08/23/2023 we discussed the options.  She had repeat molecular studies performed which did not show any new actionable mutations.  Therefore the plan will be to proceed with single agent Alimta  IV every 3 weeks as scheduled.  The alternative options that we discussed was adding carboplatin  to the Alimta .  We also talked about amivantamab and lazertinib, but the patient and Dr. Sherrod have concerns about quality of life and side effect profile, although this likely would be effective   The patient opted to proceed on single agent chemotherapy with Alimta  400 mg/m.  She started this on 09/11/2023.  She tolerated this fairly well. She is status post 7 cycles of treatment.    She looks much improved since she starting treatment.  Labs were reviewed.  Recommend that she proceed with cycle 8 today as scheduled.    We will see her back for follow-up visit in 3 weeks for evaluation review blood work before undergoing cycle #9.  We discussed reducing her dose of  prednisone  to 7.5 mg due to reflux and facial swelling. She is agreeable. I have sent the new prescription to the pharmacy. Of course, if she has recurrent cough, then we can increase back to 10 mg.   She will take pepcid  for her reflux.   She will continue to follow with palliative care for pain management. For the worsening intermittent facial pain, I will arrange for a maxillofacial CT. She states her oxycodone  controls her pain. She was already seen by her dentist who did not feel this was TMJ but her pain is worsening and becoming more frequent.   She may start mucinex  if needed for phlegm production.     She will continue lovenox  for her history of recurrent DVTs.  He is currently taking this once a day.   She will continue using her clindamycin  lotion for her fingers.  The patient was advised to call immediately if she has any concerning symptoms in the interval. The patient voices understanding of current disease status and treatment options and is in agreement with the current care plan. All questions were answered. The patient knows to call the clinic with any problems, questions or concerns. We can certainly see the patient much sooner if necessary    Orders Placed This Encounter  Procedures   CT MAXILLOFACIAL W & WO CONTRAST    Standing Status:   Future    Expected Date:   02/18/2024    Expiration Date:   02/10/2025    If indicated for the ordered procedure, I authorize the administration of contrast media per Radiology protocol:   Yes    Does the patient have a contrast media/X-ray dye allergy?:   No    Preferred imaging location?:   [facility]     The total time spent in the appointment was 20-29 minutes  Wanna Gully L Taneil Lazarus, PA-C 02/11/24

## 2024-02-09 ENCOUNTER — Other Ambulatory Visit: Payer: Self-pay | Admitting: Physician Assistant

## 2024-02-09 DIAGNOSIS — C3491 Malignant neoplasm of unspecified part of right bronchus or lung: Secondary | ICD-10-CM

## 2024-02-11 ENCOUNTER — Inpatient Hospital Stay

## 2024-02-11 ENCOUNTER — Inpatient Hospital Stay (HOSPITAL_BASED_OUTPATIENT_CLINIC_OR_DEPARTMENT_OTHER)

## 2024-02-11 ENCOUNTER — Inpatient Hospital Stay: Admitting: Physician Assistant

## 2024-02-11 ENCOUNTER — Other Ambulatory Visit (HOSPITAL_COMMUNITY): Payer: Self-pay

## 2024-02-11 VITALS — BP 142/62 | HR 75 | Temp 98.0°F | Resp 16 | Ht 64.0 in | Wt 131.7 lb

## 2024-02-11 DIAGNOSIS — C3491 Malignant neoplasm of unspecified part of right bronchus or lung: Secondary | ICD-10-CM

## 2024-02-11 DIAGNOSIS — R519 Headache, unspecified: Secondary | ICD-10-CM

## 2024-02-11 DIAGNOSIS — Z95828 Presence of other vascular implants and grafts: Secondary | ICD-10-CM

## 2024-02-11 DIAGNOSIS — Z5111 Encounter for antineoplastic chemotherapy: Secondary | ICD-10-CM

## 2024-02-11 DIAGNOSIS — R052 Subacute cough: Secondary | ICD-10-CM

## 2024-02-11 LAB — CBC WITH DIFFERENTIAL (CANCER CENTER ONLY)
Abs Immature Granulocytes: 0.05 K/uL (ref 0.00–0.07)
Basophils Absolute: 0 K/uL (ref 0.0–0.1)
Basophils Relative: 0 %
Eosinophils Absolute: 0 K/uL (ref 0.0–0.5)
Eosinophils Relative: 0 %
HCT: 37.4 % (ref 36.0–46.0)
Hemoglobin: 12.2 g/dL (ref 12.0–15.0)
Immature Granulocytes: 1 %
Lymphocytes Relative: 15 %
Lymphs Abs: 1.3 K/uL (ref 0.7–4.0)
MCH: 28.8 pg (ref 26.0–34.0)
MCHC: 32.6 g/dL (ref 30.0–36.0)
MCV: 88.2 fL (ref 80.0–100.0)
Monocytes Absolute: 0.3 K/uL (ref 0.1–1.0)
Monocytes Relative: 3 %
Neutro Abs: 6.6 K/uL (ref 1.7–7.7)
Neutrophils Relative %: 81 %
Platelet Count: 189 K/uL (ref 150–400)
RBC: 4.24 MIL/uL (ref 3.87–5.11)
RDW: 14.4 % (ref 11.5–15.5)
WBC Count: 8.2 K/uL (ref 4.0–10.5)
nRBC: 0 % (ref 0.0–0.2)

## 2024-02-11 LAB — CMP (CANCER CENTER ONLY)
ALT: 38 U/L (ref 0–44)
AST: 26 U/L (ref 15–41)
Albumin: 4.1 g/dL (ref 3.5–5.0)
Alkaline Phosphatase: 54 U/L (ref 38–126)
Anion gap: 8 (ref 5–15)
BUN: 24 mg/dL — ABNORMAL HIGH (ref 8–23)
CO2: 24 mmol/L (ref 22–32)
Calcium: 9.2 mg/dL (ref 8.9–10.3)
Chloride: 106 mmol/L (ref 98–111)
Creatinine: 1.21 mg/dL — ABNORMAL HIGH (ref 0.44–1.00)
GFR, Estimated: 47 mL/min — ABNORMAL LOW (ref >60)
Glucose, Bld: 146 mg/dL — ABNORMAL HIGH (ref 70–99)
Potassium: 3.6 mmol/L (ref 3.5–5.1)
Sodium: 138 mmol/L (ref 135–145)
Total Bilirubin: 0.3 mg/dL (ref 0.0–1.2)
Total Protein: 6.6 g/dL (ref 6.5–8.1)

## 2024-02-11 MED ORDER — SODIUM CHLORIDE 0.9% FLUSH: 10.0000 mL | INTRAVENOUS | Status: DC | PRN

## 2024-02-11 MED ORDER — SODIUM CHLORIDE 0.9 % IV SOLN
400.0000 mg/m2 | Freq: Once | INTRAVENOUS | Status: AC
  Administered 2024-02-11: 600 mg via INTRAVENOUS
  Filled 2024-02-11: qty 20

## 2024-02-11 MED ORDER — PROCHLORPERAZINE MALEATE 10 MG PO TABS
10.0000 mg | ORAL_TABLET | Freq: Once | ORAL | Status: AC
  Administered 2024-02-11: 10 mg via ORAL
  Filled 2024-02-11: qty 1

## 2024-02-11 MED ORDER — PREDNISONE 5 MG PO TABS
7.5000 mg | ORAL_TABLET | Freq: Every day | ORAL | 0 refills | Status: DC
  Filled 2024-02-11: qty 45, 30d supply, fill #0

## 2024-02-11 MED ORDER — SODIUM CHLORIDE 0.9% FLUSH
10.0000 mL | Freq: Once | INTRAVENOUS | Status: AC
  Administered 2024-02-11: 10 mL

## 2024-02-11 MED ORDER — SODIUM CHLORIDE 0.9 % IV SOLN: INTRAVENOUS | Status: DC

## 2024-02-11 MED ORDER — CYANOCOBALAMIN 1000 MCG/ML IJ SOLN: 1000.0000 ug | Freq: Once | INTRAMUSCULAR | Status: DC

## 2024-02-11 NOTE — Patient Instructions (Signed)
 [facility] - A DEPT OF [facility]  Discharge Instructions: Thank you for choosing [facility] to provide your oncology and hematology care.   If you have a lab appointment with the Cancer Center, please go directly to the Cancer Center and check in at the registration area.   Wear comfortable clothing and clothing appropriate for easy access to any Portacath or PICC line.   We strive to give you quality time with your provider. You may need to reschedule your appointment if you arrive late (15 or more minutes).  Arriving late affects you and other patients whose appointments are after yours.  Also, if you miss three or more appointments without notifying the office, you may be dismissed from the clinic at the provider's discretion.      For prescription refill requests, have your pharmacy contact our office and allow 72 hours for refills to be completed.    Today you received the following chemotherapy and/or immunotherapy agents alimta      To help prevent nausea and vomiting after your treatment, we encourage you to take your nausea medication as directed.  BELOW ARE SYMPTOMS THAT SHOULD BE REPORTED IMMEDIATELY: *FEVER GREATER THAN 100.4 F (38 C) OR HIGHER *CHILLS OR SWEATING *NAUSEA AND VOMITING THAT IS NOT CONTROLLED WITH YOUR NAUSEA MEDICATION *UNUSUAL SHORTNESS OF BREATH *UNUSUAL BRUISING OR BLEEDING *URINARY PROBLEMS (pain or burning when urinating, or frequent urination) *BOWEL PROBLEMS (unusual diarrhea, constipation, pain near the anus) TENDERNESS IN MOUTH AND THROAT WITH OR WITHOUT PRESENCE OF ULCERS (sore throat, sores in mouth, or a toothache) UNUSUAL RASH, SWELLING OR PAIN  UNUSUAL VAGINAL DISCHARGE OR ITCHING   Items with * indicate a potential emergency and should be followed up as soon as possible or go to the Emergency Department if any problems should occur.  Please show the CHEMOTHERAPY ALERT CARD or IMMUNOTHERAPY  ALERT CARD at check-in to the Emergency Department and triage nurse.  Should you have questions after your visit or need to cancel or reschedule your appointment, please contact [facility] - A DEPT OF [facility]  Dept: [phone]  and follow the prompts.  Office hours are 8:00 a.m. to 4:30 p.m. Monday - Friday. Please note that voicemails left after 4:00 p.m. may not be returned until the following business day.  We are closed weekends and major holidays. You have access to a nurse at all times for urgent questions. Please call the main number to the clinic Dept: [phone] and follow the prompts.   For any non-urgent questions, you may also contact your provider using MyChart. We now offer e-Visits for anyone 50 and older to request care online for non-urgent symptoms. For details visit [email or web address].   Also download the MyChart app! Go to the app store, search "MyChart", open the app, select [facility], and log in with your MyChart username and password.

## 2024-02-12 NOTE — Progress Notes (Deleted)
 Cardiology Office Note:    Date:  02/12/2024   ID:  Allison [name], DOB Jul 15, 1950, MRN [id number]  PCP:  [name], MD  Cardiologist:  Lonni LITTIE Nanas, MD  Electrophysiologist:  None   Referring MD: [name], MD   No chief complaint on file.   History of Present Illness:    Allison [name] is a 73 y.o. female with a hx of CAD status post DES to RCA 06/16/19, stage IV lung cancer, hypertension, hyperlipidemia, DVT/PE, CVA who presents for follow-up.  She presented to ED on 06/12/2019 with chest pain, found to have NSTEMI with peak high-sensitivity troponin 9133.  TTE on 06/13/2019 showed LVEF 55 to 60%, mild LVH, low normal RV systolic function, moderate MR. Cath showed 95% proximal RCA lesion, drug-eluting stent was successfully placed.  Zio patch x13 days on 01/07/2020 showed 3 episodes of NSVT longest lasting 7 beats, 17 episodes of SVT, longest lasting 15 seconds.  At clinic visit in September 2021, she reported chest pains.  Lexiscan  Myoview  on 03/19/2020 showed normal perfusion, EF 70%.  She was admitted to [facility] 02/08/2021 with acute DVT/PE.  Echocardiogram 02/08/2021 showed normal biventricular function, no significant valvular disease.  She was discharged on Eliquis  02/10/2021.  Subsequently was admitted back to [facility] on 02/11/2021 with acute CVA.  This was thought to be embolic in setting of hypercoagulable state due to stage IV lung cancer.  Oncology recommended switching from Eliquis  to Lovenox  for anticoagulation.  Echocardiogram 02/13/2021 showed normal LV systolic function, mild to moderate MR. Zio patch x14 days on 07/21/2021 showed no significant arrhythmias.  Echocardiogram 10/2023 showed EF 55 to 60%, normal RV function, mild to moderate mitral regurgitation, moderate tricuspid regurgitation.  Since last clinic visit,  she reports she has been having shortness of breath with minimal exertion.  Also feels chest pain when she is exerting herself or taking a deep breath.   She is back on chemotherapy.   Past Medical History:  Diagnosis Date   Anemia    Anxiety    Arthritis    Asthma    exercise induced   Coronary artery disease    Depression    PMH   Dyspnea    GERD (gastroesophageal reflux disease)    Glaucoma    History of radiation therapy 01/05/2021   IMRT right lung  11/24/2020-01/05/2021  Dr Lynwood Nasuti   Hypertension    lung ca dx'd 11/2017   right   Malignant pleural effusion    right   Nuclear sclerotic cataract of right eye 02/28/2021   Dr. Lonni Gaudy, cataract surgery February 2023   PONV (postoperative nausea and vomiting)    Pre-diabetes    Raynaud's disease    Raynaud's disease    Stroke (HCC) 01/2021   balance off, some express aphasia, weakness    Past Surgical History:  Procedure Laterality Date   ABDOMINAL HYSTERECTOMY     partial   BRONCHIAL BIOPSY  04/21/2021   Procedure: BRONCHIAL BIOPSIES;  Surgeon: Brenna Adine LITTIE, DO;  Location: [facility];  Service: Pulmonary;;   BRONCHIAL BRUSHINGS  04/21/2021   Procedure: BRONCHIAL BRUSHINGS;  Surgeon: Brenna Adine LITTIE, DO;  Location: [facility];  Service: Pulmonary;;   BRONCHIAL NEEDLE ASPIRATION BIOPSY  04/21/2021   Procedure: BRONCHIAL NEEDLE ASPIRATION BIOPSIES;  Surgeon: Brenna Adine LITTIE, DO;  Location: [facility];  Service: Pulmonary;;   CHEST TUBE INSERTION Right 01/01/2018   Procedure: INSERTION PLEURAL DRAINAGE CATHETER;  Surgeon: Fleeta Hanford Coy, MD;  Location: [facility]  OR;  Service: Thoracic;  Laterality: Right;   CHEST TUBE INSERTION  04/21/2021   Procedure: CHEST TUBE INSERTION;  Surgeon: Brenna Adine CROME, DO;  Location: [facility];  Service: Pulmonary;;   COLONOSCOPY     CORONARY STENT INTERVENTION N/A 06/16/2019   Procedure: CORONARY STENT INTERVENTION;  Surgeon: Dann Candyce RAMAN, MD;  Location: [facility] INVASIVE CV LAB;  Service: Cardiovascular;  Laterality: N/A;   DILATION AND CURETTAGE OF UTERUS     EYE SURGERY     due to Glaucoma   IR IMAGING GUIDED  PORT INSERTION  03/22/2021   IR PORT REPAIR CENTRAL VENOUS ACCESS DEVICE  04/15/2021   LEFT HEART CATH AND CORONARY ANGIOGRAPHY N/A 06/16/2019   Procedure: LEFT HEART CATH AND CORONARY ANGIOGRAPHY;  Surgeon: Dann Candyce RAMAN, MD;  Location: [facility] INVASIVE CV LAB;  Service: Cardiovascular;  Laterality: N/A;   REMOVAL OF PLEURAL DRAINAGE CATHETER Right 11/07/2018   Procedure: REMOVAL OF PLEURAL DRAINAGE CATHETER;  Surgeon: Fleeta Hanford Coy, MD;  Location: [facility] OR;  Service: Thoracic;  Laterality: Right;   ROTATOR CUFF REPAIR     TUBAL LIGATION     VIDEO BRONCHOSCOPY WITH ENDOBRONCHIAL NAVIGATION Bilateral 04/21/2021   Procedure: VIDEO BRONCHOSCOPY WITH ENDOBRONCHIAL NAVIGATION;  Surgeon: Brenna Adine CROME, DO;  Location: [facility];  Service: Pulmonary;  Laterality: Bilateral;  ION   WISDOM TOOTH EXTRACTION      Current Medications: No outpatient medications have been marked as taking for the 02/15/24 encounter (Appointment) with Kate Lonni CROME, MD.     Allergies:   Hydrocodone -acetaminophen , Other, Penicillins, Lactose, Vicodin [hydrocodone -acetaminophen ], Bacid, and Ipratropium-albuterol    Social History   Socioeconomic History   Marital status: Widowed    Spouse name: Not on file   Number of children: 3   Years of education: Not on file   Highest education level: Not on file  Occupational History    Employer: [employer]  Tobacco Use   Smoking status: Never    Passive exposure: Past   Smokeless tobacco: Never  Vaping Use   Vaping status: Never Used  Substance and Sexual Activity   Alcohol use: Not Currently    Comment: up to 3 drinks per week   Drug use: No    Comment: CBD oil    Sexual activity: Not Currently    Comment: Hysterectomy  Other Topics Concern   Not on file  Social History Narrative   Not on file   Social Drivers of Health   Financial Resource Strain: Low Risk  (07/22/2019)   Overall Financial Resource Strain (CARDIA)    Difficulty of Paying Living  Expenses: Not hard at all  Food Insecurity: No Food Insecurity (09/07/2023)   Hunger Vital Sign    Worried About Running Out of Food in the Last Year: Never true    Ran Out of Food in the Last Year: Never true  Transportation Needs: No Transportation Needs (09/07/2023)   PRAPARE - Administrator, Civil Service (Medical): No    Lack of Transportation (Non-Medical): No  Physical Activity: Sufficiently Active (07/22/2019)   Exercise Vital Sign    Days of Exercise per Week: 5 days    Minutes of Exercise per Session: 30 min  Stress: Not on file  Social Connections: Socially Integrated (09/07/2023)   Social Connection and Isolation Panel    Frequency of Communication with Friends and Family: Three times a week    Frequency of Social Gatherings with Friends and Family: Once a week  Attends Religious Services: More than 4 times per year    Active Member of Clubs or Organizations: Yes    Attends Banker Meetings: More than 4 times per year    Marital Status: Living with partner     Family History: The patient's family history includes Heart disease in her brother and sister; Lung cancer in an other family member.  ROS:   Please see the history of present illness.     All other systems reviewed and are negative.  EKGs/Labs/Other Studies Reviewed:    The following studies were reviewed today:   EKG:   06/21/21: Normal sinus rhythm, rate 70, no ST abnormality 03/23/22: Normal sinus rhythm, rate 72, no ST abnormalities 09/26/2023: Normal sinus rhythm, rate 82, no ST abnormalities  Cath 06/16/19: Prox LAD lesion is 25% stenosed. Prox RCA lesion is 95% stenosed. A drug-eluting stent was successfully placed using a SYNERGY XD 2.25X28, postdilated to 2.5 mm. Post intervention, there is a 0% residual stenosis. The left ventricular systolic function is normal. LV end diastolic pressure is normal. The left ventricular ejection fraction is 55-65% by visual estimate. There  is no aortic valve stenosis.   Continue dual antiplatelet therapy.  Would check with Pharmacy about interactions of Plavix  with Tagrisso .  If there is an interaction, would switch to Brilinta.        TTE 06/13/19:  1. Left ventricular ejection fraction, by visual estimation, is 55 to  60%. The left ventricle has normal function. There is mildly increased  left ventricular hypertrophy.   2. The left ventricle has no regional wall motion abnormalities.   3. Global right ventricle has low normal systolic function.The right  ventricular size is normal. No increase in right ventricular wall  thickness.   4. Left atrial size was normal.   5. Right atrial size was normal.   6. Mild mitral annular calcification.   7. The mitral valve is degenerative. Moderate mitral valve regurgitation.   8. The tricuspid valve is grossly normal.   9. The aortic valve is tricuspid. Aortic valve regurgitation is not  visualized. No evidence of aortic valve sclerosis or stenosis.  10. The pulmonic valve was grossly normal. Pulmonic valve regurgitation is  not visualized.  11. Normal pulmonary artery systolic pressure.  12. The inferior vena cava is normal in size with greater than 50%  respiratory variability, suggesting right atrial pressure of 3 mmHg.   Carotid duplex 08/01/19: Right Carotid: There is no evidence of stenosis in the right ICA. The                 extracranial vessels were near-normal with only minimal  wall                 thickening or plaque.   Left Carotid: Velocities in the left ICA are consistent with a 1-39%  stenosis.                Non-hemodynamically significant plaque <50% noted in the  CCA.   Vertebrals:  Bilateral vertebral arteries demonstrate antegrade flow.  Subclavians: Normal flow hemodynamics were seen in bilateral subclavian               arteries.   Recent Labs: 02/11/2024: ALT 38; BUN 24; Creatinine 1.21; Hemoglobin 12.2; Platelet Count 189; Potassium 3.6; Sodium  138  Recent Lipid Panel    Component Value Date/Time   CHOL 149 10/02/2023 0000   TRIG 60 10/02/2023 0000   HDL 60 10/02/2023  0000   CHOLHDL 2.5 10/02/2023 0000   CHOLHDL 2.8 02/12/2021 0559   VLDL 22 02/12/2021 0559   LDLCALC 77 10/02/2023 0000    Physical Exam:    VS:  LMP  (LMP Unknown)     Wt Readings from Last 3 Encounters:  02/11/24 131 lb 11.2 oz (59.7 kg)  01/21/24 129 lb (58.5 kg)  12/31/23 131 lb 11.2 oz (59.7 kg)     GEN:  Well nourished, well developed in no acute distress HEENT: Normal NECK: No JVD CARDIAC: RRR, no murmurs, rubs, gallops RESPIRATORY:  Clear to auscultation without rales, wheezing or rhonchi  ABDOMEN: Soft, non-tender, non-distended MUSCULOSKELETAL:  No edema SKIN: Warm and dry NEUROLOGIC:  Alert and oriented x 3 PSYCHIATRIC:  Normal affect   ASSESSMENT:    No diagnosis found.    PLAN:    CAD: Status post drug-eluting stent to RCA on 06/16/2019.  At clinic visit in September 2021, she reported chest pains.  Lexiscan  Myoview  on 03/19/2020 showed normal perfusion, EF 70%.  She reported exertional chest pain and repeat Myoview  03/29/2021 showed normal perfusion, EF 68%. -She stopped taking aspirin  due to GI symptoms.  Okay to discontinue aspirin  given she is on therapeutic Lovenox  - Continue carvedilol  6.25 mg twice daily and Imdur  30 mg daily -Has had issues starting statins due to transaminitis and myalgias.  Referred to lipid clinic and started on Praluent  in September 2021.  Now on Repatha  -She is reporting dyspnea with minimal exertion as well as atypical chest pain.  Chest pain is pleuritic, less suspicion for angina.  Echocardiogram 10/2023 showed EF 55 to 60%, normal RV function, mild to moderate mitral regurgitation, moderate tricuspid regurgitation. Discussed stress test but as patient has stage IV lung cancer and is seeing palliative care, her preference was to hold off on stress testing at this point***  DVT/PE: admitted to [facility]  02/08/2021 with acute DVT/PE.  She was started on Eliquis , but switched to therapeutic Lovenox  after her CVA 02/10/2021 per oncology  CVA: admitted back to [facility] on 02/11/2021 with acute CVA.  This was thought to be embolic in setting of hypercoagulable state due to stage IV lung cancer.  Oncology recommended switching from Eliquis  to Lovenox  for anticoagulation.  Palpitations: Description concerning for arrhythmia.  Zio patch x13 days on 01/07/2020 showed 3 episodes of NSVT longest lasting 7 beats, 17 episodes of SVT, longest lasting 15 seconds.  Continue carvedilol .  Carotid bruit: 1 to 39% left carotid artery stenosis, no stenosis in right coronary artery on carotid duplex 08/01/2019.  Hyperlipidemia: LDL 148 on 06/13/2019, improved to LDL 44 08/15/19 while on Lipitor  80 mg daily, but was discontinued due to transaminitis.  She also reported having myalgias with atorvastatin .  Referred to lipid clinic, started on rosuvastatin  5 mg every other day but developed myalgias.  Was reduced to twice weekly but continued to have myalgias so was discontinued.  Started on Praluent  September 2021.  LDL 80 on 06/15/2020, Praluent  dose increased to 150 mg every 14 days.  LDL 77 on 09/2023  Hypertension: On carvedilol  6.25 mg twice daily and Imdur  30 mg daily  Stage IV lung cancer: Followed by oncology.  Has been on Tagrisso .  CT showed progression of disease, now on chemotherapy.  Following with palliative  Syncope: Description suggest orthostasis.  Zio patch x14 days on 07/21/2021 showed no significant arrhythmias.  RTC in 6 months***   Medication Adjustments/Labs and Tests Ordered: Current medicines are reviewed at length with the patient today.  Concerns regarding medicines are outlined above.  No orders of the defined types were placed in this encounter.   No orders of the defined types were placed in this encounter.    There are no Patient Instructions on file for this visit.   Signed, Lonni LITTIE Nanas, MD  02/12/2024 10:24 PM    [facility]

## 2024-02-15 ENCOUNTER — Ambulatory Visit: Admitting: Cardiology

## 2024-02-17 NOTE — Progress Notes (Unsigned)
 Cardiology Office Note:    Date:  02/21/2024   ID:  Allison [name], DOB 1951/04/19, MRN [id number]  PCP:  [name], MD  Cardiologist:  Lonni LITTIE Nanas, MD  Electrophysiologist:  None   Referring MD: [name], MD   Chief Complaint  Patient presents with   Coronary Artery Disease    History of Present Illness:    Allison [name] is a 73 y.o. female with a hx of CAD status post DES to RCA 06/16/19, stage IV lung cancer, hypertension, hyperlipidemia, DVT/PE, CVA who presents for follow-up.  She presented to ED on 06/12/2019 with chest pain, found to have NSTEMI with peak high-sensitivity troponin 9133.  TTE on 06/13/2019 showed LVEF 55 to 60%, mild LVH, low normal RV systolic function, moderate MR. Cath showed 95% proximal RCA lesion, drug-eluting stent was successfully placed.  Zio patch x13 days on 01/07/2020 showed 3 episodes of NSVT longest lasting 7 beats, 17 episodes of SVT, longest lasting 15 seconds.  At clinic visit in September 2021, she reported chest pains.  Lexiscan  Myoview  on 03/19/2020 showed normal perfusion, EF 70%.  She was admitted to [facility] 02/08/2021 with acute DVT/PE.  Echocardiogram 02/08/2021 showed normal biventricular function, no significant valvular disease.  She was discharged on Eliquis  02/10/2021.  Subsequently was admitted back to [facility] on 02/11/2021 with acute CVA.  This was thought to be embolic in setting of hypercoagulable state due to stage IV lung cancer.  Oncology recommended switching from Eliquis  to Lovenox  for anticoagulation.  Echocardiogram 02/13/2021 showed normal LV systolic function, mild to moderate MR. Zio patch x14 days on 07/21/2021 showed no significant arrhythmias.  Echocardiogram 10/2023 showed EF 55 to 60%, normal RV function, mild to moderate mitral regurgitation, moderate tricuspid regurgitation.  Since last clinic visit, she reports she is doing okay.  Reports had episode of chest tightness yesterday, lasted all day.  Described as  burning and heaviness in chest.  Did note it was worse with activity.  Reports improved with taking Pepcid .  BP elevated today but did not take her morning meds.    Past Medical History:  Diagnosis Date   Anemia    Anxiety    Arthritis    Asthma    exercise induced   Coronary artery disease    Depression    PMH   Dyspnea    GERD (gastroesophageal reflux disease)    Glaucoma    History of radiation therapy 01/05/2021   IMRT right lung  11/24/2020-01/05/2021  Dr Lynwood Nasuti   Hypertension    lung ca dx'd 11/2017   right   Malignant pleural effusion    right   Nuclear sclerotic cataract of right eye 02/28/2021   Dr. Lonni Gaudy, cataract surgery February 2023   PONV (postoperative nausea and vomiting)    Pre-diabetes    Raynaud's disease    Raynaud's disease    Stroke (HCC) 01/2021   balance off, some express aphasia, weakness    Past Surgical History:  Procedure Laterality Date   ABDOMINAL HYSTERECTOMY     partial   BRONCHIAL BIOPSY  04/21/2021   Procedure: BRONCHIAL BIOPSIES;  Surgeon: Brenna Adine LITTIE, DO;  Location: [facility] ENDOSCOPY;  Service: Pulmonary;;   BRONCHIAL BRUSHINGS  04/21/2021   Procedure: BRONCHIAL BRUSHINGS;  Surgeon: Brenna Adine LITTIE, DO;  Location: [facility] ENDOS[facility];  Service: Pulmonary;;   BRONCHIAL NEEDLE ASPIRATION BIOPSY  04/21/2021   Procedure: BRONCHIAL NEEDLE ASPIRATION BIOPSIES;  Surgeon: Brenna Adine LITTIE, DO;  Location: [facility] ENDOSCOPY;  Service:  Pulmonary;;   CHEST TUBE INSERTION Right 01/01/2018   Procedure: INSERTION PLEURAL DRAINAGE CATHETER;  Surgeon: Fleeta Hanford Coy, MD;  Location: [facility] OR;  Service: Thoracic;  Laterality: Right;   CHEST TUBE INSERTION  04/21/2021   Procedure: CHEST TUBE INSERTION;  Surgeon: Brenna Adine CROME, DO;  Location: [facility] ENDOSCOPY;  Service: Pulmonary;;   COLONOSCOPY     CORONARY STENT INTERVENTION N/A 06/16/2019   Procedure: CORONARY STENT INTERVENTION;  Surgeon: Dann Candyce RAMAN, MD;  Location: [facility] INVASIVE CV LAB;   Service: Cardiovascular;  Laterality: N/A;   DILATION AND CURETTAGE OF UTERUS     EYE SURGERY     due to Glaucoma   IR IMAGING GUIDED PORT INSERTION  03/22/2021   IR PORT REPAIR CENTRAL VENOUS ACCESS DEVICE  04/15/2021   LEFT HEART CATH AND CORONARY ANGIOGRAPHY N/A 06/16/2019   Procedure: LEFT HEART CATH AND CORONARY ANGIOGRAPHY;  Surgeon: Dann Candyce RAMAN, MD;  Location: [facility] INVASIVE CV LAB;  Service: Cardiovascular;  Laterality: N/A;   REMOVAL OF PLEURAL DRAINAGE CATHETER Right 11/07/2018   Procedure: REMOVAL OF PLEURAL DRAINAGE CATHETER;  Surgeon: Fleeta Hanford Coy, MD;  Location: [facility] OR;  Service: Thoracic;  Laterality: Right;   ROTATOR CUFF REPAIR     TUBAL LIGATION     VIDEO BRONCHOSCOPY WITH ENDOBRONCHIAL NAVIGATION Bilateral 04/21/2021   Procedure: VIDEO BRONCHOSCOPY WITH ENDOBRONCHIAL NAVIGATION;  Surgeon: Brenna Adine CROME, DO;  Location: [facility]OS[facility];  Service: Pulmonary;  Laterality: Bilateral;  ION   WISDOM TOOTH EXTRACTION      Current Medications: Current Meds  Medication Sig   ALPRAZolam  (XANAX ) 0.25 MG tablet Take 1 tablet (0.25 mg total) by mouth 2 (two) times daily as needed for anxiety. May take 2 tablets (0.5 mg total) at bedtime. (Patient taking differently: Take 0.25-0.5 mg by mouth 2 (two) times daily as needed for anxiety or sleep.)   ALPRAZolam  (XANAX ) 0.5 MG tablet Take 1 tablet (0.5 mg total) by mouth 3 (three) times daily as needed.   amLODipine  (NORVASC ) 5 MG tablet Take 1 tablet (5 mg total) by mouth daily.   carvedilol  (COREG ) 6.25 MG tablet Take 1 tablet (6.25 mg total) by mouth 2 (two) times daily.   dexamethasone  (DECADRON ) 4 MG tablet Take 1 tablet twice a day the day before, day of, and day after treatment   dorzolamide -timolol  (COSOPT ) 22.3-6.8 MG/ML ophthalmic solution Place 1 drop into both eyes 2 (two) times daily.   doxycycline  (VIBRA -TABS) 100 MG tablet Take 1 tablet (100 mg total) by mouth 2 (two) times daily.   enoxaparin  (LOVENOX ) 60 MG/0.6ML  injection Inject 0.55 mLs (55 mg total) into the skin every 12 (twelve) hours.   ezetimibe  (ZETIA ) 10 MG tablet Take 10 mg by mouth daily.   famotidine  (PEPCID ) 20 MG tablet TAKE 1 TABLET BY MOUTH TWICE A DAY   FeFum-FePoly-FA-B Cmp-C-Biot (FOLIVANE-PLUS) CAPS Take 1 capsule by mouth in the morning.   fentaNYL  (DURAGESIC ) 12 MCG/HR Place 1 patch onto the skin every 3 (three) days.   FLUoxetine  (PROZAC ) 20 MG capsule Take 1 capsule (20 mg total) by mouth daily.   folic acid  (FOLVITE ) 1 MG tablet TAKE 1 TABLET BY MOUTH EVERY DAY   gabapentin  (NEURONTIN ) 250 MG/5ML solution Take 4 mLs (200 mg total) by mouth at bedtime.   Glycopyrrolate -Formoterol  (BEVESPI  AEROSPHERE) 9-4.8 MCG/ACT AERO Inhale 2 puffs into the lungs 2 (two) times daily.   Glycopyrrolate -Formoterol  (BEVESPI  AEROSPHERE) 9-4.8 MCG/ACT AERO Inhale 2 puffs into the lungs 2 (two) times daily.   HYDROcodone   bit-homatropine (HYCODAN) 5-1.5 MG/5ML syrup Take 5 mLs by mouth every 6 (six) hours as needed for cough.   hyoscyamine  (LEVSIN  SL) 0.125 MG SL tablet Place 1-2 tablets (0.125-0.25mg ) under the tongue every 6 (six) hours as needed for cramping.   isosorbide  mononitrate (IMDUR ) 30 MG 24 hr tablet Take 1 tablet (30 mg total) by mouth daily.   latanoprost  (XALATAN ) 0.005 % ophthalmic solution Place 1 drop into both eyes at bedtime.    lidocaine -prilocaine  (EMLA ) cream Apply 1 Application topically as needed.   nitroGLYCERIN  (NITROSTAT ) 0.4 MG SL tablet Place 1 tablet (0.4 mg total) under the tongue every 5 (five) minutes as needed for chest pain.   ondansetron  (ZOFRAN -ODT) 4 MG disintegrating tablet Take 1 tablet (4 mg total) by mouth every 8 (eight) hours as needed for nausea or vomiting.   osimertinib  mesylate (TAGRISSO ) 80 MG tablet Take 1 tablet (80 mg total) by mouth daily.   oxyCODONE  (OXY IR/ROXICODONE ) 5 MG immediate release tablet Take 1-2 tablets (5-10 mg total) by mouth every 4 (four) hours as needed for severe pain (pain score  7-10) or breakthrough pain.   predniSONE  (DELTASONE ) 5 MG tablet Take 1.5 tablets (7.5 mg total) by mouth daily with breakfast.   prochlorperazine  (COMPAZINE ) 10 MG tablet Take 1 tablet (10 mg total) by mouth every 6 (six) hours as needed.   REPATHA  SURECLICK 140 MG/ML SOAJ Inject 140 mg into the skin every 14 (fourteen) days.   senna-docusate (SENNA S) 8.6-50 MG tablet Take 2 tablets by mouth at bedtime.   Wheat Dextrin (BENEFIBER DRINK MIX) PACK Take 1 packet by mouth daily.     Allergies:   Hydrocodone -acetaminophen , Other, Penicillins, Lactose, Vicodin [hydrocodone -acetaminophen ], Bacid, and Ipratropium-albuterol    Social History   Socioeconomic History   Marital status: Widowed    Spouse name: Not on file   Number of children: 3   Years of education: Not on file   Highest education level: Not on file  Occupational History    Employer: [employer]  Tobacco Use   Smoking status: Never    Passive exposure: Past   Smokeless tobacco: Never  Vaping Use   Vaping status: Never Used  Substance and Sexual Activity   Alcohol use: Not Currently    Comment: up to 3 drinks per week   Drug use: No    Comment: CBD oil    Sexual activity: Not Currently    Comment: Hysterectomy  Other Topics Concern   Not on file  Social History Narrative   Not on file   Social Drivers of Health   Financial Resource Strain: Low Risk  (07/22/2019)   Overall Financial Resource Strain (CARDIA)    Difficulty of Paying Living Expenses: Not hard at all  Food Insecurity: No Food Insecurity (09/07/2023)   Hunger Vital Sign    Worried About Running Out of Food in the Last Year: Never true    Ran Out of Food in the Last Year: Never true  Transportation Needs: No Transportation Needs (09/07/2023)   PRAPARE - Administrator, Civil Service (Medical): No    Lack of Transportation (Non-Medical): No  Physical Activity: Sufficiently Active (07/22/2019)   Exercise Vital Sign    Days of Exercise per Week: 5  days    Minutes of Exercise per Session: 30 min  Stress: Not on file  Social Connections: Socially Integrated (09/07/2023)   Social Connection and Isolation Panel    Frequency of Communication with Friends and Family: Three times  a week    Frequency of Social Gatherings with Friends and Family: Once a week    Attends Religious Services: More than 4 times per year    Active Member of [employer] or Organizations: Yes    Attends Engineer, structural: More than 4 times per year    Marital Status: Living with partner     Family History: The patient's family history includes Heart disease in her brother and sister; Lung cancer in an other family member.  ROS:   Please see the history of present illness.     All other systems reviewed and are negative.  EKGs/Labs/Other Studies Reviewed:    The following studies were reviewed today:   EKG:   06/21/21: Normal sinus rhythm, rate 70, no ST abnormality 03/23/22: Normal sinus rhythm, rate 72, no ST abnormalities 09/26/2023: Normal sinus rhythm, rate 82, no ST abnormalities 02/21/24: NSR, rate 68, no ST abnormalities  Cath 06/16/19: Prox LAD lesion is 25% stenosed. Prox RCA lesion is 95% stenosed. A drug-eluting stent was successfully placed using a SYNERGY XD 2.25X28, postdilated to 2.5 mm. Post intervention, there is a 0% residual stenosis. The left ventricular systolic function is normal. LV end diastolic pressure is normal. The left ventricular ejection fraction is 55-65% by visual estimate. There is no aortic valve stenosis.   Continue dual antiplatelet therapy.  Would check with Pharmacy about interactions of Plavix  with Tagrisso .  If there is an interaction, would switch to Brilinta.        TTE 06/13/19:  1. Left ventricular ejection fraction, by visual estimation, is 55 to  60%. The left ventricle has normal function. There is mildly increased  left ventricular hypertrophy.   2. The left ventricle has no regional wall motion  abnormalities.   3. Global right ventricle has low normal systolic function.The right  ventricular size is normal. No increase in right ventricular wall  thickness.   4. Left atrial size was normal.   5. Right atrial size was normal.   6. Mild mitral annular calcification.   7. The mitral valve is degenerative. Moderate mitral valve regurgitation.   8. The tricuspid valve is grossly normal.   9. The aortic valve is tricuspid. Aortic valve regurgitation is not  visualized. No evidence of aortic valve sclerosis or stenosis.  10. The pulmonic valve was grossly normal. Pulmonic valve regurgitation is  not visualized.  11. Normal pulmonary artery systolic pressure.  12. The inferior vena cava is normal in size with greater than 50%  respiratory variability, suggesting right atrial pressure of 3 mmHg.   Carotid duplex 08/01/19: Right Carotid: There is no evidence of stenosis in the right ICA. The                 extracranial vessels were near-normal with only minimal  wall                 thickening or plaque.   Left Carotid: Velocities in the left ICA are consistent with a 1-39%  stenosis.                Non-hemodynamically significant plaque <50% noted in the  CCA.   Vertebrals:  Bilateral vertebral arteries demonstrate antegrade flow.  Subclavians: Normal flow hemodynamics were seen in bilateral subclavian               arteries.   Recent Labs: 02/11/2024: ALT 38; BUN 24; Creatinine 1.21; Hemoglobin 12.2; Platelet Count 189; Potassium 3.6; Sodium 138  Recent  Lipid Panel    Component Value Date/Time   CHOL 149 10/02/2023 0000   TRIG 60 10/02/2023 0000   HDL 60 10/02/2023 0000   CHOLHDL 2.5 10/02/2023 0000   CHOLHDL 2.8 02/12/2021 0559   VLDL 22 02/12/2021 0559   LDLCALC 77 10/02/2023 0000    Physical Exam:    VS:  BP (!) 160/94 (BP Location: Left Arm, Patient Position: Sitting, Cuff Size: Normal)   Pulse 69   Ht 5' 4 (1.626 m)   Wt 126 lb (57.2 kg)   LMP  (LMP Unknown)    SpO2 98%   BMI 21.63 kg/m     Wt Readings from Last 3 Encounters:  02/21/24 126 lb (57.2 kg)  02/11/24 131 lb 11.2 oz (59.7 kg)  01/21/24 129 lb (58.5 kg)     GEN:  Well nourished, well developed in no acute distress HEENT: Normal NECK: No JVD CARDIAC: RRR, no murmurs, rubs, gallops RESPIRATORY:  Clear to auscultation without rales, wheezing or rhonchi  ABDOMEN: Soft, non-tender, non-distended MUSCULOSKELETAL:  No edema SKIN: Warm and dry NEUROLOGIC:  Alert and oriented x 3 PSYCHIATRIC:  Normal affect   ASSESSMENT:    1. Chest pain of uncertain etiology   2. Coronary artery disease involving native coronary artery of native heart, unspecified whether angina present   3. SOB (shortness of breath)   4. Cerebrovascular accident (CVA), unspecified mechanism (HCC)   5. Essential hypertension   6. Hyperlipidemia, unspecified hyperlipidemia type      PLAN:    CAD: Status post drug-eluting stent to RCA on 06/16/2019.  At clinic visit in September 2021, she reported chest pains.  Lexiscan  Myoview  on 03/19/2020 showed normal perfusion, EF 70%.  She reported exertional chest pain and repeat Myoview  03/29/2021 showed normal perfusion, EF 68%. -She stopped taking aspirin  due to GI symptoms.  Okay to discontinue aspirin  given she is on therapeutic Lovenox  -Continue carvedilol  6.25 mg twice daily and Imdur  30 mg daily -Has had issues starting statins due to transaminitis and myalgias.  Referred to lipid clinic and started on Praluent  in September 2021.  Now on Repatha  - Reported episode of chest pain lasted all day yesterday.  She did not seek any medical attention.  Denies any chest pain today.  EKG unremarkable.  Check troponin to rule out that event yesterday was MI.  Discussed that if troponin elevated, she needs to go to ED.  Prescribed as needed sublingual nitroglycerin  and discussed that if having chest pain longer than 5 minutes she will take 1 nitroglycerin , if not relieved can take  another nitroglycerin  after 5 minutes, but if not relieved by 2 nitroglycerin  she needs to go to ED.  Will order Lexiscan  Myoview  to evaluate for ischemia  DVT/PE: admitted to [facility] 02/08/2021 with acute DVT/PE.  She was started on Eliquis , but switched to therapeutic Lovenox  after her CVA 02/10/2021 per oncology  CVA: admitted back to [facility] on 02/11/2021 with acute CVA.  This was thought to be embolic in setting of hypercoagulable state due to stage IV lung cancer.  Oncology recommended switching from Eliquis  to Lovenox  for anticoagulation.  Palpitations: Description concerning for arrhythmia.  Zio patch x13 days on 01/07/2020 showed 3 episodes of NSVT longest lasting 7 beats, 17 episodes of SVT, longest lasting 15 seconds.  Continue carvedilol .  Carotid bruit: 1 to 39% left carotid artery stenosis, no stenosis in right coronary artery on carotid duplex 08/01/2019.  Hyperlipidemia: LDL 148 on 06/13/2019, improved to LDL 44 08/15/19 while on Lipitor   80 mg daily, but was discontinued due to transaminitis.  She also reported having myalgias with atorvastatin .  Referred to lipid clinic, started on rosuvastatin  5 mg every other day but developed myalgias.  Was reduced to twice weekly but continued to have myalgias so was discontinued.  Started on Praluent  September 2021.  Now on Repatha .  LDL 77 on 09/2023  Hypertension: On carvedilol  6.25 mg twice daily and Imdur  30 mg daily.  BP elevated today but did not take her medications.  Asked to check BP twice daily for next week and let us  know results  Stage IV lung cancer: Followed by oncology.  On Alimta .  Following with palliative  Syncope: Description suggest orthostasis.  Zio patch x14 days on 07/21/2021 showed no significant arrhythmias.  RTC in 3 months  Informed Consent   Shared Decision Making/Informed Consent The risks [chest pain, shortness of breath, cardiac arrhythmias, dizziness, blood pressure fluctuations, myocardial infarction, stroke/transient  ischemic attack, nausea, vomiting, allergic reaction, radiation exposure, metallic taste sensation and life-threatening complications (estimated to be 1 in 10,000)], benefits (risk stratification, diagnosing coronary artery disease, treatment guidance) and alternatives of a nuclear stress test were discussed in detail with Allison [name] and she agrees to proceed.       Medication Adjustments/Labs and Tests Ordered: Current medicines are reviewed at length with the patient today.  Concerns regarding medicines are outlined above.  Orders Placed This Encounter  Procedures   Troponin T   Cardiac Stress Test: Informed Consent Details: Physician/Practitioner Attestation; Transcribe to consent form and obtain patient signature   MYOCARDIAL PERFUSION IMAGING   EKG 12-Lead    Meds ordered this encounter  Medications   nitroGLYCERIN  (NITROSTAT ) 0.4 MG SL tablet    Sig: Place 1 tablet (0.4 mg total) under the tongue every 5 (five) minutes as needed for chest pain.    Dispense:  25 tablet    Refill:  3     Patient Instructions  Medication Instructions:  Continue current medications *If you need a refill on your cardiac medications before your next appointment, please call your pharmacy*  Lab Work: troponin If you have labs (blood work) drawn today and your tests are completely normal, you will receive your results only by: MyChart Message (if you have MyChart) OR A paper copy in the mail If you have any lab test that is abnormal or we need to change your treatment, we will call you to review the results.  Testing/Procedures: Your physician has requested that you have a lexiscan  myoview . For further information please visit [email or web address] Please follow instruction sheet, as given.   Follow-Up: At [facility], you and your health needs are our priority.  As part of our continuing mission to provide you with exceptional heart care, our providers are all part of one team.  This  team includes your primary Cardiologist (physician) and Advanced Practice Providers or APPs (Physician Assistants and Nurse Practitioners) who all work together to provide you with the care you need, when you need it.  Your next appointment:   3  Provider:   Dr. Kate  We recommend signing up for the patient portal called MyChart.  Sign up information is provided on this After Visit Summary.  MyChart is used to connect with patients for Virtual Visits (Telemedicine).  Patients are able to view lab/test results, encounter notes, upcoming appointments, etc.  Non-urgent messages can be sent to your provider as well.   To learn more about what you can do with MyChart,  go to [email or web address].   Other Instructions Please check blood pressure twice a day for one week and send those reading       Signed, Lonni LITTIE Nanas, MD  02/21/2024 9:23 AM    [facility]

## 2024-02-19 ENCOUNTER — Ambulatory Visit (HOSPITAL_COMMUNITY)
Admission: RE | Admit: 2024-02-19 | Discharge: 2024-02-19 | Disposition: A | Discharge status: Home / Self Care | Source: Ambulatory Visit | Attending: Physician Assistant | Admitting: Physician Assistant

## 2024-02-19 ENCOUNTER — Telehealth: Payer: Self-pay | Admitting: Physician Assistant

## 2024-02-19 DIAGNOSIS — R519 Headache, unspecified: Secondary | ICD-10-CM | POA: Diagnosis present

## 2024-02-19 DIAGNOSIS — C3491 Malignant neoplasm of unspecified part of right bronchus or lung: Secondary | ICD-10-CM | POA: Diagnosis present

## 2024-02-19 MED ORDER — HEPARIN SOD (PORK) LOCK FLUSH 100 UNIT/ML IV SOLN
500.0000 [IU] | Freq: Once | INTRAVENOUS | Status: AC
Start: 2024-02-19 — End: 2024-02-19
  Administered 2024-02-19: 500 [IU] via INTRAVENOUS

## 2024-02-19 MED ORDER — IOHEXOL 300 MG/ML  SOLN
75.0000 mL | Freq: Once | INTRAMUSCULAR | Status: AC | PRN
  Administered 2024-02-19: 75 mL via INTRAVENOUS

## 2024-02-19 NOTE — Telephone Encounter (Signed)
 I called the patient to let her know her maxillofacial CT was negative for acute pathology.

## 2024-02-21 ENCOUNTER — Encounter: Payer: Self-pay | Admitting: Cardiology

## 2024-02-21 ENCOUNTER — Ambulatory Visit: Attending: Cardiology | Admitting: Cardiology

## 2024-02-21 ENCOUNTER — Other Ambulatory Visit (HOSPITAL_COMMUNITY): Payer: Self-pay

## 2024-02-21 ENCOUNTER — Telehealth: Payer: Self-pay | Admitting: Pharmacy Technician

## 2024-02-21 ENCOUNTER — Encounter: Payer: Self-pay | Admitting: *Deleted

## 2024-02-21 VITALS — BP 160/94 | HR 69 | Ht 64.0 in | Wt 126.0 lb

## 2024-02-21 DIAGNOSIS — I2510 Atherosclerotic heart disease of native coronary artery without angina pectoris: Secondary | ICD-10-CM

## 2024-02-21 DIAGNOSIS — R079 Chest pain, unspecified: Secondary | ICD-10-CM | POA: Diagnosis not present

## 2024-02-21 DIAGNOSIS — R0602 Shortness of breath: Secondary | ICD-10-CM

## 2024-02-21 DIAGNOSIS — I10 Essential (primary) hypertension: Secondary | ICD-10-CM

## 2024-02-21 DIAGNOSIS — E785 Hyperlipidemia, unspecified: Secondary | ICD-10-CM

## 2024-02-21 DIAGNOSIS — I639 Cerebral infarction, unspecified: Secondary | ICD-10-CM

## 2024-02-21 LAB — TROPONIN T: Troponin T (Highly Sensitive): 22 ng/L — ABNORMAL HIGH (ref 0–14)

## 2024-02-21 MED ORDER — REPATHA SURECLICK 140 MG/ML ~~LOC~~ SOAJ
140.0000 mg | SUBCUTANEOUS | 3 refills | Status: AC
  Filled 2024-02-21: qty 6, 84d supply, fill #0
  Filled 2024-05-27: qty 6, 84d supply, fill #1

## 2024-02-21 MED ORDER — NITROGLYCERIN 0.4 MG SL SUBL
0.4000 mg | SUBLINGUAL_TABLET | SUBLINGUAL | 3 refills | Status: AC | PRN
  Filled 2024-02-21: qty 25, 8d supply, fill #0

## 2024-02-21 NOTE — Patient Instructions (Signed)
 Medication Instructions:  Continue current medications *If you need a refill on your cardiac medications before your next appointment, please call your pharmacy*  Lab Work: troponin If you have labs (blood work) drawn today and your tests are completely normal, you will receive your results only by: MyChart Message (if you have MyChart) OR A paper copy in the mail If you have any lab test that is abnormal or we need to change your treatment, we will call you to review the results.  Testing/Procedures: Your physician has requested that you have a lexiscan  myoview . For further information please visit [email or web address] Please follow instruction sheet, as given.   Follow-Up: At [facility], you and your health needs are our priority.  As part of our continuing mission to provide you with exceptional heart care, our providers are all part of one team.  This team includes your primary Cardiologist (physician) and Advanced Practice Providers or APPs (Physician Assistants and Nurse Practitioners) who all work together to provide you with the care you need, when you need it.  Your next appointment:   3  Provider:   Dr. Kate  We recommend signing up for the patient portal called MyChart.  Sign up information is provided on this After Visit Summary.  MyChart is used to connect with patients for Virtual Visits (Telemedicine).  Patients are able to view lab/test results, encounter notes, upcoming appointments, etc.  Non-urgent messages can be sent to your provider as well.   To learn more about what you can do with MyChart, go to [email or web address].   Other Instructions Please check blood pressure twice a day for one week and send those reading

## 2024-02-21 NOTE — Telephone Encounter (Signed)
 I was told pt needed renewal/pre auth/assistance for repatha . But - her assistance is good until 10/2024 and her prior authorization is still good until 06/18/24. she just got 3 months on 12/18/23 so she shouldn't be due until 03/13/24 it says. but I ran a test claim and it still goes through. [facility] so she can call them when its time to be filled and it will go no problem for free  Sent message back to staff

## 2024-02-22 ENCOUNTER — Ambulatory Visit: Payer: Self-pay | Admitting: Cardiology

## 2024-02-26 ENCOUNTER — Encounter (HOSPITAL_COMMUNITY): Payer: Self-pay

## 2024-02-29 ENCOUNTER — Encounter (HOSPITAL_COMMUNITY)

## 2024-03-03 ENCOUNTER — Inpatient Hospital Stay: Attending: Internal Medicine

## 2024-03-03 ENCOUNTER — Encounter: Payer: Self-pay | Admitting: Internal Medicine

## 2024-03-03 ENCOUNTER — Inpatient Hospital Stay (HOSPITAL_BASED_OUTPATIENT_CLINIC_OR_DEPARTMENT_OTHER): Attending: Internal Medicine | Admitting: Internal Medicine

## 2024-03-03 ENCOUNTER — Encounter: Payer: Self-pay | Admitting: Physician Assistant

## 2024-03-03 ENCOUNTER — Other Ambulatory Visit: Payer: Self-pay | Admitting: Physician Assistant

## 2024-03-03 ENCOUNTER — Other Ambulatory Visit (HOSPITAL_COMMUNITY): Payer: Self-pay

## 2024-03-03 ENCOUNTER — Inpatient Hospital Stay

## 2024-03-03 VITALS — BP 131/65

## 2024-03-03 VITALS — BP 157/74 | HR 79 | Temp 98.0°F | Resp 17 | Ht 64.0 in | Wt 131.8 lb

## 2024-03-03 DIAGNOSIS — C3491 Malignant neoplasm of unspecified part of right bronchus or lung: Secondary | ICD-10-CM

## 2024-03-03 DIAGNOSIS — C3490 Malignant neoplasm of unspecified part of unspecified bronchus or lung: Secondary | ICD-10-CM

## 2024-03-03 DIAGNOSIS — R052 Subacute cough: Secondary | ICD-10-CM

## 2024-03-03 DIAGNOSIS — Z5111 Encounter for antineoplastic chemotherapy: Secondary | ICD-10-CM | POA: Diagnosis present

## 2024-03-03 DIAGNOSIS — T50995S Adverse effect of other drugs, medicaments and biological substances, sequela: Secondary | ICD-10-CM | POA: Diagnosis not present

## 2024-03-03 DIAGNOSIS — R053 Chronic cough: Secondary | ICD-10-CM

## 2024-03-03 DIAGNOSIS — C3411 Malignant neoplasm of upper lobe, right bronchus or lung: Secondary | ICD-10-CM | POA: Insufficient documentation

## 2024-03-03 LAB — CBC WITH DIFFERENTIAL (CANCER CENTER ONLY)
Abs Immature Granulocytes: 0.02 K/uL (ref 0.00–0.07)
Basophils Absolute: 0 K/uL (ref 0.0–0.1)
Basophils Relative: 0 %
Eosinophils Absolute: 0 K/uL (ref 0.0–0.5)
Eosinophils Relative: 0 %
HCT: 35.8 % — ABNORMAL LOW (ref 36.0–46.0)
Hemoglobin: 11.6 g/dL — ABNORMAL LOW (ref 12.0–15.0)
Immature Granulocytes: 1 %
Lymphocytes Relative: 22 %
Lymphs Abs: 0.9 K/uL (ref 0.7–4.0)
MCH: 28.5 pg (ref 26.0–34.0)
MCHC: 32.4 g/dL (ref 30.0–36.0)
MCV: 88 fL (ref 80.0–100.0)
Monocytes Absolute: 0.2 K/uL (ref 0.1–1.0)
Monocytes Relative: 5 %
Neutro Abs: 3.1 K/uL (ref 1.7–7.7)
Neutrophils Relative %: 72 %
Platelet Count: 171 K/uL (ref 150–400)
RBC: 4.07 MIL/uL (ref 3.87–5.11)
RDW: 13.9 % (ref 11.5–15.5)
WBC Count: 4.3 K/uL (ref 4.0–10.5)
nRBC: 0 % (ref 0.0–0.2)

## 2024-03-03 LAB — CMP (CANCER CENTER ONLY)
ALT: 30 U/L (ref 0–44)
AST: 33 U/L (ref 15–41)
Albumin: 3.9 g/dL (ref 3.5–5.0)
Alkaline Phosphatase: 50 U/L (ref 38–126)
Anion gap: 8 (ref 5–15)
BUN: 22 mg/dL (ref 8–23)
CO2: 24 mmol/L (ref 22–32)
Calcium: 8.8 mg/dL — ABNORMAL LOW (ref 8.9–10.3)
Chloride: 106 mmol/L (ref 98–111)
Creatinine: 1.16 mg/dL — ABNORMAL HIGH (ref 0.44–1.00)
GFR, Estimated: 50 mL/min — ABNORMAL LOW (ref >60)
Glucose, Bld: 152 mg/dL — ABNORMAL HIGH (ref 70–99)
Potassium: 3.9 mmol/L (ref 3.5–5.1)
Sodium: 138 mmol/L (ref 135–145)
Total Bilirubin: 0.3 mg/dL (ref 0.0–1.2)
Total Protein: 6.8 g/dL (ref 6.5–8.1)

## 2024-03-03 MED ORDER — SODIUM CHLORIDE 0.9 % IV SOLN: INTRAVENOUS | Status: DC

## 2024-03-03 MED ORDER — PREDNISONE 10 MG PO TABS: 10.0000 mg | ORAL_TABLET | Freq: Every day | ORAL | 2 refills | Status: DC

## 2024-03-03 MED ORDER — GUAIFENESIN ER 600 MG PO TB12
600.0000 mg | ORAL_TABLET | Freq: Two times a day (BID) | ORAL | 0 refills | Status: DC
  Filled 2024-03-03 – 2024-04-21 (×2): qty 60, 30d supply, fill #0

## 2024-03-03 MED ORDER — SODIUM CHLORIDE 0.9 % IV SOLN
400.0000 mg/m2 | Freq: Once | INTRAVENOUS | Status: AC
  Administered 2024-03-03: 600 mg via INTRAVENOUS
  Filled 2024-03-03: qty 20

## 2024-03-03 MED ORDER — HYDROCODONE BIT-HOMATROP MBR 5-1.5 MG/5ML PO SOLN
5.0000 mL | Freq: Four times a day (QID) | ORAL | 0 refills | Status: DC | PRN
  Filled 2024-03-03: qty 240, 12d supply, fill #0

## 2024-03-03 MED ORDER — CYANOCOBALAMIN 1000 MCG/ML IJ SOLN
1000.0000 ug | Freq: Once | INTRAMUSCULAR | Status: AC
  Administered 2024-03-03: 1000 ug via INTRAMUSCULAR
  Filled 2024-03-03: qty 1

## 2024-03-03 MED ORDER — PROCHLORPERAZINE MALEATE 10 MG PO TABS
10.0000 mg | ORAL_TABLET | Freq: Once | ORAL | Status: AC
  Administered 2024-03-03: 10 mg via ORAL
  Filled 2024-03-03: qty 1

## 2024-03-03 NOTE — Progress Notes (Signed)
 [facility] [phone]   [phone]  OFFICE PROGRESS NOTE  Allison Iha, MD [address] [place] [address]  DIAGNOSIS: Stage IV (T2 a,N2, M1a) non-small cell lung cancer, adenocarcinoma diagnosed in July 2019 and presented with right upper lobe lung mass in addition to mediastinal lymphadenopathy as well as bilateral pulmonary nodules and malignant right pleural effusion.   Biomarker Findings Microsatellite status - Cannot Be Determined Tumor Mutational Burden - Cannot Be Determined Genomic Findings For a complete list of the genes assayed, please refer to the Appendix. EGFR exon 19 deletion ([id number]) TP53 Y220C 7 Disease relevant genes with no reportable alterations: KRAS, ALK, BRAF, MET, RET, ERBB2, ROS1    PRIOR THERAPY:  1) Status post right Pleurx catheter placement by Dr. Fleeta Ochoa for drainage of malignant right pleural effusion. 2) palliative radiotherapy to the enlarging right upper lobe lung mass and mediastinum under the care of Dr. Shannon expected to be completed on January 05, 2021. 3) Systemic chemotherapy with carboplatin  for AUC of 5 and Alimta  500 Mg/M2 every 3 weeks.  First dose 03/17/2021.  The patient will also continue her current treatment with Tagrisso  80 mg p.o. daily.  She is status post 26 cycles. Starting from cycle #7, she will be on Alimta  only 400 mg/m2.  Her treatment with Alimta  has been on hold for several months now.  She is currently on treatment with single agent Tagrisso  80 mg p.o. daily    CURRENT THERAPY: Tagrisso  80 mg p.o. daily.  First dose was given on 01/29/2018.  Status post 63 months of treatment.  She also resumed maintenance treatment with Alimta  400 mg/M2 on September 11, 2023 status post 8 cycles.  INTERVAL HISTORY: Allison [name] 73 y.o. female returns to the clinic today for follow-up visit accompanied by her boyfriend, Allison.  Her daughter Allison [name] was available by phone  during the visit.Discussed the use of AI scribe software for clinical note transcription with the patient, who gave verbal consent to proceed.  History of Present Illness Allison [name] is a 73 year old female with stage four nonsmall cell lung cancer who presents for evaluation before starting cycle number nine of her treatment with Alimta .  She has a history of stage four nonsmall cell lung cancer, adenocarcinoma, diagnosed in July 2019 with a positive EGFR mutation with deletion in exon 19. She is currently on maintenance treatment with Tagrisso  and Alimta , having completed eight cycles.  She experiences a recurrence of her cough, describing it as 'trying to creep up on me'. She uses a vest to help loosen the phlegm, which is clear, sometimes thick, and sometimes runny. The cough returned after her steroid dose was reduced from 10 mg to 7.5 mg around Labor Day. No colored sputum and no post-nasal drainage, although she feels like she is swallowing something. She uses Mucinex  and Robitussin for her symptoms and is awaiting a refill from her pharmacy.  She has not experienced any weight loss and maintains her weight around a target.  Her cardiologist has recommended a stress test. She recalls a previous heart attack where her troponin levels were significantly elevated.     MEDICAL HISTORY: Past Medical History:  Diagnosis Date   Anemia    Anxiety    Arthritis    Asthma    exercise induced   Coronary artery disease    Depression    PMH   Dyspnea    GERD (gastroesophageal reflux  disease)    Glaucoma    History of radiation therapy 01/05/2021   IMRT right lung  11/24/2020-01/05/2021  Dr Lynwood Nasuti   Hypertension    lung ca dx'd 11/2017   right   Malignant pleural effusion    right   Nuclear sclerotic cataract of right eye 02/28/2021   Dr. Lonni Gaudy, cataract surgery February 2023   PONV (postoperative nausea and vomiting)    Pre-diabetes    Raynaud's disease     Raynaud's disease    Stroke (HCC) 01/2021   balance off, some express aphasia, weakness    ALLERGIES:  is allergic to hydrocodone -acetaminophen , other, penicillins, lactose, vicodin [hydrocodone -acetaminophen ], bacid, and ipratropium-albuterol .  MEDICATIONS:  Current Outpatient Medications  Medication Sig Dispense Refill   ALPRAZolam  (XANAX ) 0.25 MG tablet Take 1 tablet (0.25 mg total) by mouth 2 (two) times daily as needed for anxiety. May take 2 tablets (0.5 mg total) at bedtime. (Patient taking differently: Take 0.25-0.5 mg by mouth 2 (two) times daily as needed for anxiety or sleep.) 60 tablet 0   ALPRAZolam  (XANAX ) 0.5 MG tablet Take 1 tablet (0.5 mg total) by mouth 3 (three) times daily as needed. 90 tablet 3   amLODipine  (NORVASC ) 5 MG tablet Take 1 tablet (5 mg total) by mouth daily. 90 tablet 3   carvedilol  (COREG ) 6.25 MG tablet Take 1 tablet (6.25 mg total) by mouth 2 (two) times daily. 180 tablet 3   dexamethasone  (DECADRON ) 4 MG tablet Take 1 tablet twice a day the day before, day of, and day after treatment 40 tablet 2   dorzolamide -timolol  (COSOPT ) 22.3-6.8 MG/ML ophthalmic solution Place 1 drop into both eyes 2 (two) times daily.     doxycycline  (VIBRA -TABS) 100 MG tablet Take 1 tablet (100 mg total) by mouth 2 (two) times daily. 14 tablet 0   enoxaparin  (LOVENOX ) 60 MG/0.6ML injection Inject 0.55 mLs (55 mg total) into the skin every 12 (twelve) hours. 100 mL 1   ezetimibe  (ZETIA ) 10 MG tablet Take 10 mg by mouth daily.     famotidine  (PEPCID ) 20 MG tablet TAKE 1 TABLET BY MOUTH TWICE A DAY 180 tablet 1   FeFum-FePoly-FA-B Cmp-C-Biot (FOLIVANE-PLUS) CAPS Take 1 capsule by mouth in the morning. 90 capsule 0   fentaNYL  (DURAGESIC ) 12 MCG/HR Place 1 patch onto the skin every 3 (three) days. 10 patch 0   FLUoxetine  (PROZAC ) 20 MG capsule Take 1 capsule (20 mg total) by mouth daily. 90 capsule 3   folic acid  (FOLVITE ) 1 MG tablet TAKE 1 TABLET BY MOUTH EVERY DAY 90 tablet 1    gabapentin  (NEURONTIN ) 250 MG/5ML solution Take 4 mLs (200 mg total) by mouth at bedtime. 470 mL 1   Glycopyrrolate -Formoterol  (BEVESPI  AEROSPHERE) 9-4.8 MCG/ACT AERO Inhale 2 puffs into the lungs 2 (two) times daily. 10.7 g 3   Glycopyrrolate -Formoterol  (BEVESPI  AEROSPHERE) 9-4.8 MCG/ACT AERO Inhale 2 puffs into the lungs 2 (two) times daily. 3 each 3   hyoscyamine  (LEVSIN  SL) 0.125 MG SL tablet Place 1-2 tablets (0.125-0.25mg ) under the tongue every 6 (six) hours as needed for cramping. 30 tablet 3   isosorbide  mononitrate (IMDUR ) 30 MG 24 hr tablet Take 1 tablet (30 mg total) by mouth daily. 90 tablet 3   latanoprost  (XALATAN ) 0.005 % ophthalmic solution Place 1 drop into both eyes at bedtime.      lidocaine -prilocaine  (EMLA ) cream Apply 1 Application topically as needed. 30 g 0   nitroGLYCERIN  (NITROSTAT ) 0.4 MG SL tablet Place 1 tablet (0.4 mg  total) under the tongue every 5 (five) minutes as needed for chest pain. 25 tablet 3   ondansetron  (ZOFRAN -ODT) 4 MG disintegrating tablet Take 1 tablet (4 mg total) by mouth every 8 (eight) hours as needed for nausea or vomiting. 30 tablet 1   osimertinib  mesylate (TAGRISSO ) 80 MG tablet Take 1 tablet (80 mg total) by mouth daily. 30 tablet 4   oxyCODONE  (OXY IR/ROXICODONE ) 5 MG immediate release tablet Take 1-2 tablets (5-10 mg total) by mouth every 4 (four) hours as needed for severe pain (pain score 7-10) or breakthrough pain. 60 tablet 0   predniSONE  (DELTASONE ) 5 MG tablet Take 1.5 tablets (7.5 mg total) by mouth daily with breakfast. 45 tablet 0   prochlorperazine  (COMPAZINE ) 10 MG tablet Take 1 tablet (10 mg total) by mouth every 6 (six) hours as needed. 30 tablet 2   REPATHA  SURECLICK 140 MG/ML SOAJ Inject 140 mg into the skin every 14 (fourteen) days. 6 mL 3   senna-docusate (SENNA S) 8.6-50 MG tablet Take 2 tablets by mouth at bedtime. 60 tablet 3   Wheat Dextrin (BENEFIBER DRINK MIX) PACK Take 1 packet by mouth daily.     HYDROcodone   bit-homatropine (HYCODAN) 5-1.5 MG/5ML syrup Take 5 mLs by mouth every 6 (six) hours as needed for cough. 240 mL 0   No current facility-administered medications for this visit.    SURGICAL HISTORY:  Past Surgical History:  Procedure Laterality Date   ABDOMINAL HYSTERECTOMY     partial   BRONCHIAL BIOPSY  04/21/2021   Procedure: BRONCHIAL BIOPSIES;  Surgeon: Brenna Adine CROME, DO;  Location: [facility];  Service: Pulmonary;;   BRONCHIAL BRUSHINGS  04/21/2021   Procedure: BRONCHIAL BRUSHINGS;  Surgeon: Brenna Adine CROME, DO;  Location: [facility];  Service: Pulmonary;;   BRONCHIAL NEEDLE ASPIRATION BIOPSY  04/21/2021   Procedure: BRONCHIAL NEEDLE ASPIRATION BIOPSIES;  Surgeon: Brenna Adine CROME, DO;  Location: [facility];  Service: Pulmonary;;   CHEST TUBE INSERTION Right 01/01/2018   Procedure: INSERTION PLEURAL DRAINAGE CATHETER;  Surgeon: Fleeta Hanford Coy, MD;  Location: [facility] OR;  Service: Thoracic;  Laterality: Right;   CHEST TUBE INSERTION  04/21/2021   Procedure: CHEST TUBE INSERTION;  Surgeon: Brenna Adine CROME, DO;  Location: [facility];  Service: Pulmonary;;   COLONOSCOPY     CORONARY STENT INTERVENTION N/A 06/16/2019   Procedure: CORONARY STENT INTERVENTION;  Surgeon: Dann Candyce RAMAN, MD;  Location: [facility] INV[facility] CV LAB;  Service: Cardiovascular;  Laterality: N/A;   DILATION AND CURETTAGE OF UTERUS     EYE SURGERY     due to Glaucoma   IR IMAGING GUIDED PORT INSERTION  03/22/2021   IR PORT REPAIR CENTRAL VENOUS ACCESS DEVICE  04/15/2021   LEFT HEART CATH AND CORONARY ANGIOGRAPHY N/A 06/16/2019   Procedure: LEFT HEART CATH AND CORONARY ANGIOGRAPHY;  Surgeon: Dann Candyce RAMAN, MD;  Location: [facility] INV[facility] CV LAB;  Service: Cardiovascular;  Laterality: N/A;   REMOVAL OF PLEURAL DRAINAGE CATHETER Right 11/07/2018   Procedure: REMOVAL OF PLEURAL DRAINAGE CATHETER;  Surgeon: Fleeta Hanford Coy, MD;  Location: [facility] OR;  Service: Thoracic;  Laterality: Right;   ROTATOR CUFF REPAIR      TUBAL LIGATION     VIDEO BRONCHOSCOPY WITH ENDOBRONCHIAL NAVIGATION Bilateral 04/21/2021   Procedure: VIDEO BRONCHOSCOPY WITH ENDOBRONCHIAL NAVIGATION;  Surgeon: Brenna Adine CROME, DO;  Location: [facility];  Service: Pulmonary;  Laterality: Bilateral;  ION   WISDOM TOOTH EXTRACTION      REVIEW OF SYSTEMS:  Constitutional: positive for  fatigue Eyes: negative Ears, nose, mouth, throat, and face: negative Respiratory: positive for cough Cardiovascular: negative Gastrointestinal: negative Genitourinary:negative Integument/breast: negative Hematologic/lymphatic: negative Musculoskeletal:negative Neurological: negative Behavioral/Psych: negative Endocrine: negative Allergic/Immunologic: negative   PHYSICAL EXAMINATION: General appearance: alert, cooperative, appears stated age, fatigued, and no distress Head: Normocephalic, without obvious abnormality, atraumatic Neck: no adenopathy, no JVD, supple, symmetrical, trachea midline, and thyroid  not enlarged, symmetric, no tenderness/mass/nodules Lymph nodes: Cervical, supraclavicular, and axillary nodes normal. Resp: clear to auscultation bilaterally Back: symmetric, no curvature. ROM normal. No CVA tenderness. Cardio: regular rate and rhythm, S1, S2 normal, no murmur, click, rub or gallop GI: soft, non-tender; bowel sounds normal; no masses,  no organomegaly Extremities: extremities normal, atraumatic, no cyanosis or edema Neurologic: Alert and oriented X 3, normal strength and tone. Normal symmetric reflexes. Normal coordination and gait  ECOG PERFORMANCE STATUS: 1 - Symptomatic but completely ambulatory  Blood pressure (!) 157/74, pulse 79, temperature 98 F (36.7 C), resp. rate 17, height 5' 4 (1.626 m), weight 131 lb 12.8 oz (59.8 kg), SpO2 100%.  LABORATORY DATA: Lab Results  Component Value Date   WBC 4.3 03/03/2024   HGB 11.6 (L) 03/03/2024   HCT 35.8 (L) 03/03/2024   MCV 88.0 03/03/2024   PLT 171 03/03/2024       Chemistry      Component Value Date/Time   NA 138 03/03/2024 0811   NA 140 03/04/2021 1516   K 3.9 03/03/2024 0811   CL 106 03/03/2024 0811   CO2 24 03/03/2024 0811   BUN 22 03/03/2024 0811   BUN 21 03/04/2021 1516   CREATININE 1.16 (H) 03/03/2024 0811      Component Value Date/Time   CALCIUM  8.8 (L) 03/03/2024 0811   ALKPHOS 50 03/03/2024 0811   AST 33 03/03/2024 0811   ALT 30 03/03/2024 0811   BILITOT 0.3 03/03/2024 0811       RADIOGRAPHIC STUDIES: CT MAXILLOFACIAL W & WO CONTRAST Result Date: 02/19/2024 CLINICAL DATA:  Stage IV lung cancer, bilateral facial pain EXAM: CT MAXILLOFACIAL WITHOUT AND WITH CONTRAST TECHNIQUE: Multidetector CT imaging of the maxillofacial structures was performed without and with intravenous contrast. Multiplanar CT image reconstructions were also generated. RADIATION DOSE REDUCTION: This exam was performed according to the departmental dose-optimization program which includes automated exposure control, adjustment of the mA and/or kV according to patient size and/or use of iterative reconstruction technique. CONTRAST:  75mL OMNIPAQUE  IOHEXOL  300 MG/ML  SOLN COMPARISON:  February 12, 2021 CT FINDINGS: Osseous: No fracture or mandibular dislocation. No destructive process. Orbits: Negative. No traumatic or inflammatory finding. Sinuses: Clear. Soft tissues: Unremarkable. Limited intracranial: No acute findings identified. IMPRESSION: No acute facial pathology identified. Electronically Signed   By: Michaeline Blanch M.D.   On: 02/19/2024 11:54    ASSESSMENT AND PLAN: This is a very pleasant 73 years old never smoker African-American female recently with a stage IV non-small cell lung cancer, adenocarcinoma with positive EGFR mutation with deletion in exon 19. The patient was started on treatment with Tagrisso  80 mg p.o. daily status post 35 months of treatment. She has been tolerating this treatment well with no concerning adverse effects except for intermittent  diarrhea. She had repeat CT scan of the chest, abdomen pelvis performed recently.  I personally and independently reviewed the scans and discussed the results with the patient and her boyfriend today. Unfortunately the CT scan showed interval progression of the right apical lung mass in addition to progression of mediastinal lymphadenopathy concerning for worsening of her  disease. She has no actionable resistant mutation on the molecular studies performed by [facility]. The patient continued her current treatment with Tagrisso  and tolerating it fairly well. 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 Shannon completed January 05, 2021. The patient had significant opacities in her lung that was initially thought to be secondary to radiation treatment versus Tagrisso  induced pneumonitis versus lymphangitic spread of the tumor.  She was treated with high-dose taper regiment of prednisone  Repeat imaging studies after the palliative radiotherapy showed evidence for disease progression. Her molecular studies by [facility] recently showed no new resistant mutation.  After discussion of her treatment options including palliative care and hospice referral versus palliative systemic chemotherapy the patient was interested in proceeding palliative systemic chemotherapy.  She started  palliative systemic chemotherapy with carboplatin  for AUC of 5 and Alimta  500 Mg/M2 every 3 weeks.  Status post 26 cycles.  Starting from cycle #7 the patient is on treatment with single agent Alimta  every 3 weeks. I did not add a Avastin to her treatment because of the recent stroke.  She also continued her treatment with Tagrisso  at the same time.  Her treatment with Alimta  was discontinued more than 12 months ago secondary to intolerance and fatigue. She has been tolerating her treatment with Tagrisso  fairly well. She resumed  her treatment with maintenance Alimta  in addition to Tagrisso  again on September 11, 2023 when she had evidence for disease progression at that time. She is status post 8 cycles of treatment.  She is here today for evaluation before starting cycle #9. Assessment and Plan Assessment & Plan Stage IV EGFR-positive non-small cell lung adenocarcinoma Stage IV EGFR-positive non-small cell lung adenocarcinoma diagnosed in July 2019. Currently on maintenance treatment with Tagrisso  and Alimta . Preparing for cycle number nine of Alimta . Hemoglobin level is 11.6, indicating well-managed blood counts suitable for treatment continuation. - Administer cycle number nine of Alimta  - Order CT scan of chest, abdomen, and pelvis in two weeks - Evaluate scan results to determine need for further imaging such as PET scan if abnormalities are detected  Chronic cough Chronic cough has recurred since reduction of steroid dosage from 10 mg to 7.5 mg. Cough is associated with clear phlegm, sometimes thick or runny. No post-nasal drainage detected on sinus scan. Current management includes Mucinex  and Robitussin. Discussion with pulmonologist recommended if symptoms persist or worsen. - Refill Mucinex  - Prescribe Hycodan for cough suppression - Consult with pulmonologist if cough persists or worsens - Consider changing allergy medication if advised by pulmonologist  Cardiac evaluation Cardiologist has recommended a stress test to evaluate cardiac function. She expressed concerns about stress but was reassured that the test evaluates heart function without causing physical stress. Scheduled for next week. - Proceed with stress test as scheduled next week She was advised to call immediately if she has any concerning symptoms in the interval.  The patient voices understanding of current disease status and treatment options and is in agreement with the current care plan.  All questions were answered. The patient knows to call  the clinic with any problems, questions or concerns. We can certainly see the patient much sooner if necessary.  he total time spent in the appointment was 30 minutes.  Disclaimer: This note was dictated with voice recognition software. Similar sounding words can inadvertently be transcribed and may not be corrected upon review.

## 2024-03-04 ENCOUNTER — Other Ambulatory Visit (HOSPITAL_COMMUNITY): Payer: Self-pay

## 2024-03-04 ENCOUNTER — Telehealth: Payer: Self-pay | Admitting: Nurse Practitioner

## 2024-03-04 ENCOUNTER — Other Ambulatory Visit: Payer: Self-pay | Admitting: Cardiology

## 2024-03-04 DIAGNOSIS — R079 Chest pain, unspecified: Secondary | ICD-10-CM

## 2024-03-04 NOTE — Telephone Encounter (Signed)
 Rescheduled appointment per patients request via incoming call. Talked with the patient and she is aware of the changes made to her upcoming appointment.

## 2024-03-05 ENCOUNTER — Encounter: Payer: Self-pay | Admitting: Nurse Practitioner

## 2024-03-05 ENCOUNTER — Inpatient Hospital Stay

## 2024-03-05 ENCOUNTER — Other Ambulatory Visit (HOSPITAL_COMMUNITY): Payer: Self-pay

## 2024-03-05 DIAGNOSIS — C3490 Malignant neoplasm of unspecified part of unspecified bronchus or lung: Secondary | ICD-10-CM

## 2024-03-05 DIAGNOSIS — R530 Neoplastic (malignant) related fatigue: Secondary | ICD-10-CM | POA: Diagnosis not present

## 2024-03-05 DIAGNOSIS — G893 Neoplasm related pain (acute) (chronic): Secondary | ICD-10-CM

## 2024-03-05 DIAGNOSIS — Z515 Encounter for palliative care: Secondary | ICD-10-CM | POA: Diagnosis not present

## 2024-03-05 MED ORDER — FLUOXETINE HCL 20 MG PO CAPS
20.0000 mg | ORAL_CAPSULE | Freq: Every day | ORAL | 3 refills | Status: AC
  Filled 2024-03-05: qty 90, 90d supply, fill #0
  Filled 2024-05-31: qty 90, 90d supply, fill #1

## 2024-03-05 NOTE — Progress Notes (Signed)
 Palliative Medicine [facility]  [phone] [phone]   Name: Allison [name] Date: 03/05/2024 MRN: [id number]  DOB: [birth year]  Patient Care Team: [name], MD as PCP - General (Internal Medicine) Kate Lonni LITTIE, MD as PCP - Cardiology (Cardiology) Shannon Agent, MD as Consulting Physician (Radiation Oncology) Pickenpack-Cousar, Fannie SAILOR, NP as Nurse Practitioner (Nurse Practitioner) Evertt Lonell BROCKS, RN as Oncology Nurse Navigator (Oncology) Sherrod Sherrod, MD as Consulting Physician (Oncology) Cloria Annabella LITTIE, DO (Geriatric Medicine) Prentis Duwaine BROCKS, RN as Oncology Nurse Navigator   I connected with Allison [name] on 03/05/24 at  4:00 PM EDT by telephone and verified that I am speaking with the correct person using two identifiers.   I discussed the limitations, risks, security and privacy concerns of performing an evaluation and management service by telemedicine and the availability of in-person appointments. I also discussed with the patient that there may be a patient responsible charge related to this service. The patient expressed understanding and agreed to proceed.   Other persons participating in the visit and their role in the encounter: N/A   Patient's location: Home  Provider's location: [facility]   INTERVAL HISTORY:Taeya L Avitia is a 73 y.o. female with stage IV non-small cell lung cancer (12/2017), hypertension, CVA, CAD, DVT/PE (on Lovenox ), and GERD.  Palliative ask to see for symptom management and goals of care.   SOCIAL HISTORY:     reports that she has never smoked. She has been exposed to tobacco smoke. She has never used smokeless tobacco. She reports that she does not currently use alcohol. She reports that she does not use drugs.  ADVANCE DIRECTIVES:  MOST and AD on file   CODE STATUS: DNR  PAST MEDICAL HISTORY: Past Medical History:  Diagnosis Date   Anemia    Anxiety    Arthritis     Asthma    exercise induced   Coronary artery disease    Depression    PMH   Dyspnea    GERD (gastroesophageal reflux disease)    Glaucoma    History of radiation therapy 01/05/2021   IMRT right lung  11/24/2020-01/05/2021  Dr Agent Shannon   Hypertension    lung ca dx'd 11/2017   right   Malignant pleural effusion    right   Nuclear sclerotic cataract of right eye 02/28/2021   Dr. Lonni Gaudy, cataract surgery February 2023   PONV (postoperative nausea and vomiting)    Pre-diabetes    Raynaud's disease    Raynaud's disease    Stroke (HCC) 01/2021   balance off, some express aphasia, weakness    ALLERGIES:  is allergic to hydrocodone -acetaminophen , other, penicillins, lactose, vicodin [hydrocodone -acetaminophen ], bacid, and ipratropium-albuterol .  MEDICATIONS:  Current Outpatient Medications  Medication Sig Dispense Refill   ALPRAZolam  (XANAX ) 0.25 MG tablet Take 1 tablet (0.25 mg total) by mouth 2 (two) times daily as needed for anxiety. May take 2 tablets (0.5 mg total) at bedtime. (Patient taking differently: Take 0.25-0.5 mg by mouth 2 (two) times daily as needed for anxiety or sleep.) 60 tablet 0   ALPRAZolam  (XANAX ) 0.5 MG tablet Take 1 tablet (0.5 mg total) by mouth 3 (three) times daily as needed. 90 tablet 3   amLODipine  (NORVASC ) 5 MG tablet Take 1 tablet (5 mg total) by mouth daily. 90 tablet 3   carvedilol  (COREG ) 6.25 MG tablet Take 1 tablet (6.25 mg total) by mouth 2 (two) times daily. 180 tablet 3   dexamethasone  (  DECADRON ) 4 MG tablet Take 1 tablet twice a day the day before, day of, and day after treatment 40 tablet 2   dorzolamide -timolol  (COSOPT ) 22.3-6.8 MG/ML ophthalmic solution Place 1 drop into both eyes 2 (two) times daily.     doxycycline  (VIBRA -TABS) 100 MG tablet Take 1 tablet (100 mg total) by mouth 2 (two) times daily. 14 tablet 0   enoxaparin  (LOVENOX ) 60 MG/0.6ML injection Inject 0.55 mLs (55 mg total) into the skin every 12 (twelve) hours. 100 mL 1    ezetimibe  (ZETIA ) 10 MG tablet Take 10 mg by mouth daily.     famotidine  (PEPCID ) 20 MG tablet TAKE 1 TABLET BY MOUTH TWICE A DAY 180 tablet 1   FeFum-FePoly-FA-B Cmp-C-Biot (FOLIVANE-PLUS) CAPS Take 1 capsule by mouth in the morning. 90 capsule 0   fentaNYL  (DURAGESIC ) 12 MCG/HR Place 1 patch onto the skin every 3 (three) days. 10 patch 0   FLUoxetine  (PROZAC ) 20 MG capsule Take 1 capsule (20 mg total) by mouth daily. 90 capsule 3   folic acid  (FOLVITE ) 1 MG tablet TAKE 1 TABLET BY MOUTH EVERY DAY 90 tablet 1   gabapentin  (NEURONTIN ) 250 MG/5ML solution Take 4 mLs (200 mg total) by mouth at bedtime. 470 mL 1   Glycopyrrolate -Formoterol  (BEVESPI  AEROSPHERE) 9-4.8 MCG/ACT AERO Inhale 2 puffs into the lungs 2 (two) times daily. 10.7 g 3   Glycopyrrolate -Formoterol  (BEVESPI  AEROSPHERE) 9-4.8 MCG/ACT AERO Inhale 2 puffs into the lungs 2 (two) times daily. 3 each 3   guaiFENesin  (MUCINEX ) 600 MG 12 hr tablet Take 1 tablet (600 mg total) by mouth 2 (two) times daily. 60 tablet 0   HYDROcodone  bit-homatropine (HYCODAN) 5-1.5 MG/5ML syrup Take 5 mLs by mouth every 6 (six) hours as needed for cough. 240 mL 0   hyoscyamine  (LEVSIN  SL) 0.125 MG SL tablet Place 1-2 tablets (0.125-0.25mg ) under the tongue every 6 (six) hours as needed for cramping. 30 tablet 3   isosorbide  mononitrate (IMDUR ) 30 MG 24 hr tablet Take 1 tablet (30 mg total) by mouth daily. 90 tablet 3   latanoprost  (XALATAN ) 0.005 % ophthalmic solution Place 1 drop into both eyes at bedtime.      lidocaine -prilocaine  (EMLA ) cream Apply 1 Application topically as needed. 30 g 0   nitroGLYCERIN  (NITROSTAT ) 0.4 MG SL tablet Place 1 tablet (0.4 mg total) under the tongue every 5 (five) minutes as needed for chest pain. 25 tablet 3   ondansetron  (ZOFRAN -ODT) 4 MG disintegrating tablet Take 1 tablet (4 mg total) by mouth every 8 (eight) hours as needed for nausea or vomiting. 30 tablet 1   osimertinib  mesylate (TAGRISSO ) 80 MG tablet Take 1 tablet  (80 mg total) by mouth daily. 30 tablet 4   oxyCODONE  (OXY IR/ROXICODONE ) 5 MG immediate release tablet Take 1-2 tablets (5-10 mg total) by mouth every 4 (four) hours as needed for severe pain (pain score 7-10) or breakthrough pain. 60 tablet 0   predniSONE  (DELTASONE ) 10 MG tablet Take 1 tablet (10 mg total) by mouth daily with breakfast. 30 tablet 2   prochlorperazine  (COMPAZINE ) 10 MG tablet Take 1 tablet (10 mg total) by mouth every 6 (six) hours as needed. 30 tablet 2   REPATHA  SURECLICK 140 MG/ML SOAJ Inject 140 mg into the skin every 14 (fourteen) days. 6 mL 3   senna-docusate (SENNA S) 8.6-50 MG tablet Take 2 tablets by mouth at bedtime. 60 tablet 3   Wheat Dextrin (BENEFIBER DRINK MIX) PACK Take 1 packet by mouth daily.  No current facility-administered medications for this visit.    VITAL SIGNS: LMP  (LMP Unknown)  There were no vitals filed for this visit.  Estimated body mass index is 22.62 kg/m as calculated from the following:   Height as of 03/03/24: 5' 4 (1.626 m).   Weight as of 03/03/24: 131 lb 12.8 oz (59.8 kg).   PERFORMANCE STATUS (ECOG) : 2 - Symptomatic, <50% confined to bed  IMPRESSION: Discussed the use of AI scribe software for clinical note transcription with the patient, who gave verbal consent to proceed.  History of Present Illness SHELLE GALDAMEZ is a 73 year old female who I connected with by phone for symptom management follow-up. Denies concerns of nausea, vomiting, constipation, or diarrhea that is uncontrolled. Reports her bowel movements are regular.  Appetite continues to fluctuates. Some days are better than others.   Ms. Hemmelgarn shares states her pain has increased and she experiences chest discomfort that is not adequately managed by her current Fentanyl  12 mcg patch. She describes pain as constant. Previously improved with regimen. No significant changes to activity levels or injuries to facilitate increased discomfort. Her cough has also  resurfaced. She reports oncology team has sent in previous Hycodan prescription to allow better management. Does not require daily use of breakthrough medication. Her current regimen consiste of fentanyl  patch 15mcg and oxycodone  5-10mg  as needed.  She is currently taking Prozac  20 mg daily, which she takes with her morning medications. She previously received this prescription from [name] however they are no longer offering medication management. She anticipates needing a refill in the next three to four weeks.  All questions answered and support provided.   Goals of Care Ms. Konicki and family continue to remain hopeful for stability with treatment.  Her goal is to continue to treat the treatable allow her every opportunity to continue to thrive while aggressively managing her symptoms.  Her quality of life is most important to her and her family.  I discussed the importance of continued conversation with family and their medical providers regarding overall plan of care and treatment options, ensuring decisions are within the context of the patients values and GOCs. Assessment & Plan Cancer related pain Fentanyl  patch and oxycodone  used for pain control. Experiencing increased chest discomfort, indicating current opioid therapy is not as effective. Current patch is 12 mcg. - Apply two patches simultaneously and monitor for effectiveness and side effects. - If increased drowsiness or respiratory depression occurs, remove one patch. - Plan to increase the patch dosage to a single higher-dose patch upon refill if the two patches are effective. - Continue oxycodone  5-10 mg every 4-6 hours as needed. - Fentanyl  25 mcg patch for long-acting pain relief, change every three days. -Continue Xanax  as needed for anxiety. -Will continue to closely monitor and adjust regimen as needed  Constipation Much improved with use of Senna.  -Continue Senna-S 2 tablets at bedtime.    Anxiety/Depression Currently taking 20 mg of Prozac  daily, which helps with symptoms. Issue with previous prescriber, anticipates needing a refill in the next three to four weeks. - Send prescription for Prozac  20 mg daily to pharmacy on [address].  Follow-up I will plan to see patient back in 2-3 weeks. Sooner if needed.   Patient expressed understanding and was in agreement with this plan. She also understands that She can call the clinic at any time with any questions, concerns, or complaints.   Any controlled substances utilized were prescribed in the context of palliative  care. PDMP has been reviewed.   Visit consisted of counseling and education dealing with the complex and emotionally intense issues of symptom management and palliative care in the setting of serious and potentially life-threatening illness.  Levon Borer, AGPCNP-BC  Palliative Medicine Team/[facility]

## 2024-03-07 ENCOUNTER — Other Ambulatory Visit (HOSPITAL_COMMUNITY): Payer: Self-pay

## 2024-03-08 ENCOUNTER — Encounter: Payer: Self-pay | Admitting: Cardiology

## 2024-03-11 ENCOUNTER — Other Ambulatory Visit: Payer: Self-pay | Admitting: *Deleted

## 2024-03-11 ENCOUNTER — Ambulatory Visit (HOSPITAL_COMMUNITY): Admission: RE | Admit: 2024-03-11 | Source: Ambulatory Visit | Attending: Cardiology | Admitting: Cardiology

## 2024-03-11 MED ORDER — CARVEDILOL 12.5 MG PO TABS: 12.5000 mg | ORAL_TABLET | Freq: Two times a day (BID) | ORAL | 3 refills | Status: AC

## 2024-03-11 NOTE — Telephone Encounter (Signed)
 BP elevated, recommend increase carvedilol  to 12.5 mg twice daily

## 2024-03-11 NOTE — Progress Notes (Signed)
 Called and left patient message that Dr. Kate would like for her to start 12.5 mg twice a day. Left message to call office for any questions

## 2024-03-17 ENCOUNTER — Other Ambulatory Visit (HOSPITAL_COMMUNITY): Payer: Self-pay

## 2024-03-17 ENCOUNTER — Ambulatory Visit (HOSPITAL_COMMUNITY)
Admission: RE | Admit: 2024-03-17 | Discharge: 2024-03-17 | Disposition: A | Discharge status: Home / Self Care | Source: Ambulatory Visit | Attending: Internal Medicine | Admitting: Internal Medicine

## 2024-03-17 DIAGNOSIS — C3490 Malignant neoplasm of unspecified part of unspecified bronchus or lung: Secondary | ICD-10-CM | POA: Diagnosis present

## 2024-03-17 MED ORDER — IOHEXOL 300 MG/ML  SOLN
100.0000 mL | Freq: Once | INTRAMUSCULAR | Status: AC | PRN
  Administered 2024-03-17: 100 mL via INTRAVENOUS

## 2024-03-17 MED ORDER — ESTRADIOL 0.1 MG/GM VA CREA
1.0000 | TOPICAL_CREAM | VAGINAL | 1 refills | Status: AC
  Filled 2024-03-17: qty 42.5, 90d supply, fill #0

## 2024-03-17 MED ORDER — HEPARIN SOD (PORK) LOCK FLUSH 100 UNIT/ML IV SOLN
500.0000 [IU] | Freq: Once | INTRAVENOUS | Status: AC
  Administered 2024-03-17: 500 [IU] via INTRAVENOUS

## 2024-03-17 MED ORDER — TRIAMCINOLONE ACETONIDE 0.1 % EX CREA
1.0000 | TOPICAL_CREAM | Freq: Two times a day (BID) | CUTANEOUS | 3 refills | Status: AC
  Filled 2024-03-17: qty 30, 15d supply, fill #0
  Filled 2024-05-31: qty 30, 15d supply, fill #1

## 2024-03-17 NOTE — Progress Notes (Signed)
 [facility] OFFICE PROGRESS NOTE  [name], MD [address] [place] [address]  DIAGNOSIS: Stage IV (T2 a,N2, M1a) non-small cell lung cancer, adenocarcinoma diagnosed in July 2019 and presented with right upper lobe lung mass in addition to mediastinal lymphadenopathy as well as bilateral pulmonary nodules and malignant right pleural effusion.   Biomarker Findings Microsatellite status - Cannot Be Determined Tumor Mutational Burden - Cannot Be Determined Genomic Findings For a complete list of the genes assayed, please refer to the Appendix. EGFR exon 19 deletion ([id number]) TP53 Y220C 7 Disease relevant genes with no reportable alterations: KRAS, ALK, BRAF, MET, RET, ERBB2, ROS1    Repeat molecular studies in 2025 showed no new mutations  PRIOR THERAPY: 1) Status post right Pleurx catheter placement by Dr. Fleeta Ochoa for drainage of malignant right pleural effusion. 2) palliative radiotherapy to the enlarging right upper lobe lung mass and mediastinum under the care of Dr. Shannon expected to be completed on January 05, 2021. 3) Systemic chemotherapy with carboplatin  for AUC of 5 and Alimta  500 Mg/M2 every 3 weeks.  First dose 03/17/2021.  The patient will also continue her current treatment with Tagrisso  80 mg p.o. daily.  She is status post 26 cycles. Starting from cycle #7, she will be on Alimta  only 400 mg/m2.  Her treatment with Alimta  has been on hold for several months now.   CURRENT THERAPY: Tagrisso  80 mg p.o. daily.  First dose was given on 01/29/2018.  Status post 72.5 months of treatment.   IV chemotherapy with alimta  400 mg/m2 IV every 3 weeks to start around 09/11/23. Status post 9 cycles.   INTERVAL HISTORY: Allison [name] 73 y.o. female returns to the clinic today for a follow-up visit accompanied by her significant other. In summary, the patient was found to have disease progression in early 2025. Therefore, she restarted systemic  chemotherapy with single agent Alimta  dose reduced to 400 mg/m for tolerability.  She is status post 6 cycles of treatment and tolerated it well overall. Her most recent CT scan showed a positive response to treatment. Symptomatically, she had significant improvement in her cough since starting treatment. Before treatment, she hardly could talk without coughing. She is on 10 mg of prednisone . We tried to reduce her dose of prednisone  to 7.5 mg but her cough worsened. Therefore, she started back on prednisone  10 mg and her cough improved.   The patient was at the lunge forward walk this weekend and bent down to pick something up and pulled a muscle in her back.  She was seen at an urgent care.  They sent a prescription for 5% lidocaine  patches but it was not covered by insurance.  She has an old prescription of lidocaine  patches.  She also was prescribed baclofen but she was not sure if she is allowed to use it with her other medications.  She also has Tylenol  and she takes oxycodone  for chronic pain management and is followed by palliative care.  She is expected to see them today.  She denies any fevers or chills. She experiences her normal night sweats. She has stable dyspnea on exertion.  She is interested in pulmonary rehab and I have forwarded a message to their office.  Denies any hemoptysis. She experiences ongoing chest tightness for which she is followed by palliative care and takes oxycodone  and has a fentanyl  patch. Her nausea is better. She denies any constipation. She has intermittent diarrhea secondary to her Tagrisso  but  nothing out of the ordinary. She denies any headaches.  He sees an eye doctor for glaucoma.  She is interested in seeing a oncology ophthalmologist for second opinion. She uses eye drops. She recently had a restaging CT scan. She is here today for evaluation repeat blood work before undergoing her next cycle of treatment.   MEDICAL HISTORY: Past Medical History:  Diagnosis  Date   Anemia    Anxiety    Arthritis    Asthma    exercise induced   Coronary artery disease    Depression    PMH   Dyspnea    GERD (gastroesophageal reflux disease)    Glaucoma    History of radiation therapy 01/05/2021   IMRT right lung  11/24/2020-01/05/2021  Dr Lynwood Nasuti   Hypertension    lung ca dx'd 11/2017   right   Malignant pleural effusion (HCC)    right   Nuclear sclerotic cataract of right eye 02/28/2021   Dr. Lonni Gaudy, cataract surgery February 2023   PONV (postoperative nausea and vomiting)    Pre-diabetes    Raynaud's disease    Raynaud's disease    Stroke (HCC) 01/2021   balance off, some express aphasia, weakness    ALLERGIES:  is allergic to hydrocodone -acetaminophen , other, penicillins, lactose, vicodin [hydrocodone -acetaminophen ], bacid, and ipratropium-albuterol .  MEDICATIONS:  Current Outpatient Medications  Medication Sig Dispense Refill   ALPRAZolam  (XANAX ) 0.25 MG tablet Take 1 tablet (0.25 mg total) by mouth 2 (two) times daily as needed for anxiety. May take 2 tablets (0.5 mg total) at bedtime. (Patient taking differently: Take 0.25-0.5 mg by mouth 2 (two) times daily as needed for anxiety or sleep.) 60 tablet 0   ALPRAZolam  (XANAX ) 0.5 MG tablet Take 1 tablet (0.5 mg total) by mouth 3 (three) times daily as needed. 90 tablet 3   amLODipine  (NORVASC ) 5 MG tablet Take 1 tablet (5 mg total) by mouth daily. 90 tablet 3   baclofen (LIORESAL) 10 MG tablet Take 1 tablet (10 mg total) by mouth 3 (three) times daily. 30 each 0   carvedilol  (COREG ) 12.5 MG tablet Take 1 tablet (12.5 mg total) by mouth 2 (two) times daily. 180 tablet 3   dexamethasone  (DECADRON ) 4 MG tablet Take 1 tablet twice a day the day before, day of, and day after treatment 40 tablet 2   dorzolamide -timolol  (COSOPT ) 22.3-6.8 MG/ML ophthalmic solution Place 1 drop into both eyes 2 (two) times daily.     doxycycline  (VIBRA -TABS) 100 MG tablet Take 1 tablet (100 mg total) by mouth  2 (two) times daily. (Patient not taking: Reported on 03/23/2024) 14 tablet 0   enoxaparin  (LOVENOX ) 60 MG/0.6ML injection Inject 0.55 mLs (55 mg total) into the skin every 12 (twelve) hours. 100 mL 1   estradiol (ESTRACE) 0.1 MG/GM vaginal cream Place a pea sized amount to upper vulvar are and just inside vagina 2-3 (two to three) times a week. 42.5 g 1   ezetimibe  (ZETIA ) 10 MG tablet Take 10 mg by mouth daily.     famotidine  (PEPCID ) 20 MG tablet TAKE 1 TABLET BY MOUTH TWICE A DAY 180 tablet 1   FeFum-FePoly-FA-B Cmp-C-Biot (FOLIVANE-PLUS) CAPS Take 1 capsule by mouth in the morning. 90 capsule 0   fentaNYL  (DURAGESIC ) 12 MCG/HR Place 1 patch onto the skin every 3 (three) days. 10 patch 0   FLUoxetine  (PROZAC ) 20 MG capsule Take 1 capsule (20 mg total) by mouth daily. 90 capsule 3   folic acid  (FOLVITE ) 1 MG  tablet TAKE 1 TABLET BY MOUTH EVERY DAY 90 tablet 1   gabapentin  (NEURONTIN ) 250 MG/5ML solution Take 4 mLs (200 mg total) by mouth at bedtime. (Patient not taking: Reported on 03/23/2024) 470 mL 1   Glycopyrrolate -Formoterol  (BEVESPI  AEROSPHERE) 9-4.8 MCG/ACT AERO Inhale 2 puffs into the lungs 2 (two) times daily. 10.7 g 3   Glycopyrrolate -Formoterol  (BEVESPI  AEROSPHERE) 9-4.8 MCG/ACT AERO Inhale 2 puffs into the lungs 2 (two) times daily. 3 each 3   guaiFENesin  (MUCINEX ) 600 MG 12 hr tablet Take 1 tablet (600 mg total) by mouth 2 (two) times daily. 60 tablet 0   HYDROcodone  bit-homatropine (HYCODAN) 5-1.5 MG/5ML syrup Take 5 mLs by mouth every 6 (six) hours as needed for cough. 240 mL 0   hyoscyamine  (LEVSIN  SL) 0.125 MG SL tablet Place 1-2 tablets (0.125-0.25mg ) under the tongue every 6 (six) hours as needed for cramping. 30 tablet 3   isosorbide  mononitrate (IMDUR ) 30 MG 24 hr tablet Take 1 tablet (30 mg total) by mouth daily. 90 tablet 3   latanoprost  (XALATAN ) 0.005 % ophthalmic solution Place 1 drop into both eyes at bedtime.      lidocaine  (LIDODERM ) 5 % Place 1 patch onto the skin  daily. Remove & Discard patch within 12 hours or as directed by MD 9 patch 0   lidocaine -prilocaine  (EMLA ) cream Apply 1 Application topically as needed. 30 g 0   nitroGLYCERIN  (NITROSTAT ) 0.4 MG SL tablet Place 1 tablet (0.4 mg total) under the tongue every 5 (five) minutes as needed for chest pain. 25 tablet 3   ondansetron  (ZOFRAN -ODT) 4 MG disintegrating tablet Take 1 tablet (4 mg total) by mouth every 8 (eight) hours as needed for nausea or vomiting. 30 tablet 1   osimertinib  mesylate (TAGRISSO ) 80 MG tablet Take 1 tablet (80 mg total) by mouth daily. 30 tablet 4   oxyCODONE  (OXY IR/ROXICODONE ) 5 MG immediate release tablet Take 1-2 tablets (5-10 mg total) by mouth every 4 (four) hours as needed for severe pain (pain score 7-10) or breakthrough pain. 60 tablet 0   predniSONE  (DELTASONE ) 10 MG tablet Take 1 tablet (10 mg total) by mouth daily with breakfast. 30 tablet 2   prochlorperazine  (COMPAZINE ) 10 MG tablet Take 1 tablet (10 mg total) by mouth every 6 (six) hours as needed. 30 tablet 2   REPATHA  SURECLICK 140 MG/ML SOAJ Inject 140 mg into the skin every 14 (fourteen) days. 6 mL 3   senna-docusate (SENNA S) 8.6-50 MG tablet Take 2 tablets by mouth at bedtime. 60 tablet 3   triamcinolone  cream (KENALOG ) 0.1 % Apply a thin layer topically to the affected area 2 (two) times daily. 30 g 3   Wheat Dextrin (BENEFIBER DRINK MIX) PACK Take 1 packet by mouth daily. (Patient not taking: Reported on 03/23/2024)     No current facility-administered medications for this visit.   Facility-Administered Medications Ordered in Other Visits  Medication Dose Route Frequency Provider Last Rate Last Admin   0.9 %  sodium chloride  infusion   Intravenous Continuous Sherrod Sherrod, MD       PEMEtrexed  Disodium (ALIMTA ) 600 mg in sodium chloride  0.9 % 100 mL chemo infusion  400 mg/m2 (Treatment Plan Recorded) Intravenous Once Sherrod Sherrod, MD        SURGICAL HISTORY:  Past Surgical History:  Procedure  Laterality Date   ABDOMINAL HYSTERECTOMY     partial   BRONCHIAL BIOPSY  04/21/2021   Procedure: BRONCHIAL BIOPSIES;  Surgeon: Brenna Adine CROME, DO;  Location: [facility]  ENDOSCOPY;  Service: Pulmonary;;   BRONCHIAL BRUSHINGS  04/21/2021   Procedure: BRONCHIAL BRUSHINGS;  Surgeon: Brenna Adine CROME, DO;  Location: [facility] ENDOSCOPY;  Service: Pulmonary;;   BRONCHIAL NEEDLE ASPIRATION BIOPSY  04/21/2021   Procedure: BRONCHIAL NEEDLE ASPIRATION BIOPSIES;  Surgeon: Brenna Adine CROME, DO;  Location: [facility] ENDOSCOPY;  Service: Pulmonary;;   CHEST TUBE INSERTION Right 01/01/2018   Procedure: INSERTION PLEURAL DRAINAGE CATHETER;  Surgeon: Fleeta Hanford Coy, MD;  Location: [facility] OR;  Service: Thoracic;  Laterality: Right;   CHEST TUBE INSERTION  04/21/2021   Procedure: CHEST TUBE INSERTION;  Surgeon: Brenna Adine CROME, DO;  Location: [facility] ENDOSCOPY;  Service: Pulmonary;;   COLONOSCOPY     CORONARY STENT INTERVENTION N/A 06/16/2019   Procedure: CORONARY STENT INTERVENTION;  Surgeon: Dann Candyce RAMAN, MD;  Location: [facility] INVASIVE CV LAB;  Service: Cardiovascular;  Laterality: N/A;   DILATION AND CURETTAGE OF UTERUS     EYE SURGERY     due to Glaucoma   IR IMAGING GUIDED PORT INSERTION  03/22/2021   IR PORT REPAIR CENTRAL VENOUS ACCESS DEVICE  04/15/2021   LEFT HEART CATH AND CORONARY ANGIOGRAPHY N/A 06/16/2019   Procedure: LEFT HEART CATH AND CORONARY ANGIOGRAPHY;  Surgeon: Dann Candyce RAMAN, MD;  Location: [facility] INVASIVE CV LAB;  Service: Cardiovascular;  Laterality: N/A;   REMOVAL OF PLEURAL DRAINAGE CATHETER Right 11/07/2018   Procedure: REMOVAL OF PLEURAL DRAINAGE CATHETER;  Surgeon: Fleeta Hanford Coy, MD;  Location: [facility] OR;  Service: Thoracic;  Laterality: Right;   ROTATOR CUFF REPAIR     TUBAL LIGATION     VIDEO BRONCHOSCOPY WITH ENDOBRONCHIAL NAVIGATION Bilateral 04/21/2021   Procedure: VIDEO BRONCHOSCOPY WITH ENDOBRONCHIAL NAVIGATION;  Surgeon: Brenna Adine CROME, DO;  Location: [facility] ENDOSCOPY;  Service: Pulmonary;   Laterality: Bilateral;  ION   WISDOM TOOTH EXTRACTION      REVIEW OF SYSTEMS:   Review of Systems  Constitutional: Positive for fatigue. Negative for appetite change, chills, fever and unexpected weight change.  HENT: Negative for mouth sores, nosebleeds, sore throat and trouble swallowing.   Eyes: Negative for eye problems and icterus.  Respiratory: Positive for significantly improved cough and stable shortness of breath with exertion. Negative for hemoptysis and wheezing.   Cardiovascular:  Chronic chest tightness. Negative for chest pain or leg swelling.  Gastrointestinal: Occasional mild diarrhea. Negative for abdominal pain, nauseam and vomiting.  Genitourinary: Negative for bladder incontinence, difficulty urinating, dysuria, frequency and hematuria.   Musculoskeletal: Positive for back pain. Negative for gait problem, neck pain and neck stiffness.  Skin: Mild paronychia around the nails.  Negative for itching and rash.  Neurological: Negative for dizziness, extremity weakness, gait problem, headaches, light-headedness and seizures.  Hematological: Negative for adenopathy. Does not bruise/bleed easily.  Psychiatric/Behavioral: Negative for confusion, depression and sleep disturbance. The patient is not nervous/anxious.     PHYSICAL EXAMINATION:  Blood pressure (!) 163/71, pulse 82, temperature 97.6 F (36.4 C), temperature source Temporal, resp. rate 17, height 5' 4 (1.626 m), weight 134 lb (60.8 kg), SpO2 100%.  ECOG PERFORMANCE STATUS: 1  Physical Exam  Constitutional: Oriented to person, place, and time and thin appearing female and in no distress.   HENT:  Head: Normocephalic and atraumatic.  Mouth/Throat: Oropharynx is clear and moist. No oropharyngeal exudate.  Eyes: Conjunctivae are normal. Right eye exhibits no discharge. Left eye exhibits no discharge. No scleral icterus.  Neck: Normal range of motion. Neck supple.  Cardiovascular: Normal rate, regular rhythm, normal  heart sounds and intact distal  pulses.   Pulmonary/Chest: Effort normal and breath sounds clear to auscultation. No respiratory distress. No wheezes. No rales.  Abdominal: Soft. Bowel sounds are normal. Exhibits no distension and no mass. There is no tenderness.  Musculoskeletal: Normal range of motion. Exhibits no edema.  Lymphadenopathy:    No cervical adenopathy.  Neurological: Alert and oriented to person, place, and time. Exhibits muscle wasting. Gait normal. Coordination normal. Uses a cane for ambulation.  Skin: Skin is warm and dry. No rash noted. Not diaphoretic. No erythema. No pallor.  Psychiatric: Mood, memory and judgment normal.  Vitals reviewed.    LABORATORY DATA: Lab Results  Component Value Date   WBC 5.8 03/24/2024   HGB 11.9 (L) 03/24/2024   HCT 36.5 03/24/2024   MCV 87.3 03/24/2024   PLT 227 03/24/2024      Chemistry      Component Value Date/Time   NA 139 03/24/2024 0831   NA 140 03/04/2021 1516   K 4.0 03/24/2024 0831   CL 107 03/24/2024 0831   CO2 25 03/24/2024 0831   BUN 18 03/24/2024 0831   BUN 21 03/04/2021 1516   CREATININE 1.18 (H) 03/24/2024 0831      Component Value Date/Time   CALCIUM  9.8 03/24/2024 0831   ALKPHOS 56 03/24/2024 0831   AST 23 03/24/2024 0831   ALT 34 03/24/2024 0831   BILITOT 0.3 03/24/2024 0831       RADIOGRAPHIC STUDIES:  DG Chest 2 View Result Date: 03/23/2024 CLINICAL DATA:  chest pain EXAM: CHEST - 2 VIEW COMPARISON:  September 19, 2023 FINDINGS: The cardiomediastinal silhouette is unchanged in contour.RIGHT chest port with tip terminating over the superior cavoatrial junction. Similar appearance of a RIGHT-sided pleural effusion and asymmetric RIGHT apical pleuroparenchymal opacity. No pneumothorax. No acute pleuroparenchymal abnormality in the LEFT lung. Visualized abdomen is unremarkable. Minimal degenerative changes of the thoracic spine. IMPRESSION: Similar appearance of a RIGHT-sided pleural effusion and asymmetric  RIGHT apical pleuroparenchymal opacity consistent with sequela of known RIGHT sided malignancy. Electronically Signed   By: [name] Salter M.D.   On: 03/23/2024 11:39   CT CHEST ABDOMEN PELVIS W CONTRAST Result Date: 03/17/2024 CLINICAL DATA:  Lung cancer restaging * Tracking Code: BO * EXAM: CT CHEST, ABDOMEN, AND PELVIS WITH CONTRAST TECHNIQUE: Multidetector CT imaging of the chest, abdomen and pelvis was performed following the standard protocol during bolus administration of intravenous contrast. RADIATION DOSE REDUCTION: This exam was performed according to the departmental dose-optimization program which includes automated exposure control, adjustment of the mA and/or kV according to patient size and/or use of iterative reconstruction technique. CONTRAST:  OMNIPAQUE  IOHEXOL  300 MG/ML  SOLN COMPARISON:  01/10/2024 FINDINGS: CT CHEST FINDINGS Cardiovascular: Right chest port catheter. Aortic atherosclerosis. Normal heart size. Three-vessel coronary artery calcifications and right coronary artery stent. No pericardial effusion. Mediastinum/Nodes: Unchanged treated soft tissue about the right hilum without discretely enlarged mediastinal, hilar, or axillary lymph nodes. Thyroid  gland, trachea, and esophagus demonstrate no significant findings. Lungs/Pleura: Unchanged post treatment appearance of the right chest with dense fibrotic scarring and consolidation of the right apex as well as a small, loculated right pleural effusion and extensive pleural thickening about the right hemithorax. Interlobular septal thickening throughout the right lung base. Left lung is normally aerated. Unchanged, predominantly bandlike residua of a nodule of the deep medial left costophrenic recess (series 7, image 119). Musculoskeletal: No chest wall abnormality. No acute osseous findings. CT ABDOMEN PELVIS FINDINGS Hepatobiliary: No solid liver abnormality is seen. No gallstones, gallbladder  wall thickening, or biliary  dilatation. Pancreas: Unremarkable. No pancreatic ductal dilatation or surrounding inflammatory changes. Spleen: Normal in size without significant abnormality. Adrenals/Urinary Tract: Adrenal glands are unremarkable. Kidneys are normal, without renal calculi, solid lesion, or hydronephrosis. Bladder is unremarkable. Stomach/Bowel: Stomach is within normal limits. Appendix not clearly visualized. No evidence of bowel wall thickening, distention, or inflammatory changes. Large burden of stool in the proximal colon. Vascular/Lymphatic: Aortic atherosclerosis. No enlarged abdominal or pelvic lymph nodes. Reproductive: Hysterectomy. Other: No abdominal wall hernia or abnormality. No ascites. Musculoskeletal: No acute osseous findings. Unchanged sclerotic superior endplate deformities of T6 and T7 (series 6, image 89). IMPRESSION: 1. Unchanged post treatment appearance of the right chest with dense fibrotic scarring and consolidation of the right apex as well as a small, loculated right pleural effusion and extensive pleural thickening about the right hemithorax. 2. Unchanged, predominantly bandlike residua of a nodule of the deep medial left costophrenic recess. No new nodules. 3. Unchanged treated soft tissue about the right hilum without discretely enlarged mediastinal, hilar, or axillary lymph nodes. 4. No evidence of lymphadenopathy or metastatic disease in the abdomen or pelvis. 5. Coronary artery disease. Aortic Atherosclerosis (ICD10-I70.0). Electronically Signed   By: Marolyn JONETTA Jaksch M.D.   On: 03/17/2024 10:09     ASSESSMENT/PLAN:  This is a very pleasant 73 year old never smoker African-American female diagnosed with stage IV non-small cell lung cancer, adenocarcinoma.  She was positive for an EGFR mutation with deletion in exon 19.  She was diagnosed in July 2019 and presented with right upper lobe lung mass in addition to mediastinal lymphadenopathy as well as bilateral pulmonary nodules and malignant  right pleural effusion.   The patient was started on treatment with Tagrisso  80 mg p.o. daily status post 39 months of treatment. Started on 01/29/2018.   In April 2022, she showed evidence of disease progression with interval progression of the right apical lung mass in addition to progression of mediastinal lymphadenopathy concerning for worsening of her disease. She had repeat Guardant 360 molecular studies which did not show evidence of new resistant mutations.  Therefore, she was referred to radiation oncology and completed palliative radiotherapy to the enlarging right upper lobe lung mass and mediastinum under the care of Dr. Shannon.  This was completed in July 2022.    The patient underwent a repeat bronchoscopy and biopsy for repeat molecular testing.  The sample from the left and right lung biopsy was negative for malignancy. This was performed in November 2022.    Unfortunately, the patient  was found to have evidence of disease progression.  Therefore, Dr. Sherrod started the patient on systemic chemotherapy with carboplatin  for AUC of 5 and Alimta  500 mg per metered squared.  She is status post 23 cycles and she tolerated it fairly well despite her ongoing issues with fatigue, chest tightness, cough, and weight loss.  Alimta  was reduced to 400 mg per metered squared due to renal insufficiency.  She is not a good candidate for Avastin due to her recent CVA in August 2022.  She also is continuing to take Tagrisso  as it is protective against progressive metastatic disease to the brain. Starting from cycle #7,  she started maintenance single agent alimta . Her treatment has been on hold since March 2024 due to side effects of treatment.    Of note, the patient saw Dr. Valentino from [facility] for second opinion in November 2022.  They discussed if she progresses on chemotherapy that there may be upcoming  options. He discussed other treatment options which may be available in 2023 including  patritumab deruxtcan and lazertinib/amivantmab.    Her most recent restaging CT scan showed disease progression with new and increasing septal thickening and perilymphatic nodularity throughout the right hemithorax suspicious for lymphangitic spread of tumor.  A PET scan to confirm this.   Therefore, at her appointment on 08/23/2023 we discussed the options.  She had repeat molecular studies performed which did not show any new actionable mutations.  Therefore the plan will be to proceed with single agent Alimta  IV every 3 weeks as scheduled.  The alternative options that we discussed was adding carboplatin  to the Alimta .  We also talked about amivantamab and lazertinib, but the patient and Dr. Sherrod have concerns about quality of life and side effect profile, although this likely would be effective   The patient opted to proceed on single agent chemotherapy with Alimta  400 mg/m.  She started this on 09/11/2023.  She tolerated this fairly well. She is status post 9 cycles of treatment.    She looks much improved since she starting treatment.    Labs were reviewed.  Recommend that she proceed with cycle 10 today as scheduled.   The patient was seen with Dr. Sherrod today.  Dr. Sherrod personally and independently reviewed the scan and discussed results with the patient today.  The scan showed no evidence of disease progression.  Dr. Sherrod recommends she continue on the same treatment at the same dose.    We will see her back for follow-up visit in 4 weeks for evaluation (she is going out of town in 3 weeks when she would be due for her next treatment) and to review blood work before undergoing cycle #11.   She will continue prednisone  10 mg for now. Her cough worsened when we tried to reduce the dose to 7.5 mg. I did mention that steroids may be increasing her glaucoma pressure. That is a risk benefit discussion.    She will take pepcid  for her reflux.  I reached out to pulmonary medicine about  initiating pulmonary rehab.     She will continue lovenox  for her history of recurrent DVTs.  He is currently taking this once a day.   She will continue using her clindamycin  lotion for her fingers.  Chronic back pain Managed with lidocaine  patches, Tylenol , and baclofen. Baclofen should not be taken with oxycodone  due to drowsiness risk. - Consider over-the-counter 4% lidocaine  patches if insurance does not cover 5% prescription patches. - Use heating pads as needed. - Consider using Salonpas patches, ensuring they do not contain lidocaine  if using lidocaine  patches. - Space out baclofen and oxycodone  to avoid excessive drowsiness.  The patient was advised to call immediately if she has any concerning symptoms in the interval. The patient voices understanding of current disease status and treatment options and is in agreement with the current care plan. All questions were answered. The patient knows to call the clinic with any problems, questions or concerns. We can certainly see the patient much sooner if necessary      No orders of the defined types were placed in this encounter.    Edvin Albus L Trace Cederberg, PA-C 03/24/24  ADDENDUM: Hematology/oncology Attending: I had a face-to-face encounter with the patient today.  I reviewed her record, lab, scan and recommended her care plan.  This is a very pleasant 73 years old African-American female with a stage IV non-small cell lung cancer diagnosed in July 2019 with  positive eGFR mutation exon 19 deletion status post treatment initially with single agent Tagrisso  80 mg p.o. daily since August 2019.  She also has concurrent chemotherapy with carboplatin  and Alimta  for 6 cycles followed by maintenance treatment with Alimta  in addition to Tagrisso .  The patient has been tolerating this treatment fairly well with no concerning complaints except for fatigue. She had repeat CT scan of the chest, abdomen and pelvis performed recently.  I  personally independently reviewed the scan and discussed the result with the patient and her boyfriend as well as her daughter who was available by phone during the visit today.  Her scan showed no concerning finding for disease progression. I recommended for her to continue her current treatment with Tagrisso  in addition to Alimta  at a reduced dose of 400 mg every 3 weeks. She will come back for follow-up visit in 4 weeks because she requested 1 additional week off to go to the beach. She was advised to call immediately if she has any other concerning symptoms in the interval. The total time spent in the appointment was 30 minutes including review of chart and various tests results, discussions about plan of care and coordination of care plan . Disclaimer: This note was dictated with voice recognition software. Similar sounding words can inadvertently be transcribed and may be missed upon review. Sherrod MARLA Sherrod, MD

## 2024-03-19 ENCOUNTER — Encounter: Payer: Self-pay | Admitting: Internal Medicine

## 2024-03-19 ENCOUNTER — Telehealth (HOSPITAL_COMMUNITY): Payer: Self-pay

## 2024-03-19 DIAGNOSIS — R0609 Other forms of dyspnea: Secondary | ICD-10-CM

## 2024-03-19 NOTE — Telephone Encounter (Signed)
 Patient called stating she has previously attended pulmonary rehab at the beginning of 2024 and would like to return to improve her breathing. Informed patient she will need to request a new referral from her doctor. Patient states she is currently undergoing chemo every 3 weeks and would need to be out the week of her chemo treatment to recover, but would like to do the full program. Informed her I would pass the information on to my pulmonary nurse who would be able to make the decision of whether that would work for pulmonary rehab scheduling.

## 2024-03-23 ENCOUNTER — Ambulatory Visit (HOSPITAL_COMMUNITY): Admission: EM | Admit: 2024-03-23 | Discharge: 2024-03-23 | Disposition: A | Discharge status: Home / Self Care

## 2024-03-23 ENCOUNTER — Ambulatory Visit (INDEPENDENT_AMBULATORY_CARE_PROVIDER_SITE_OTHER)

## 2024-03-23 ENCOUNTER — Encounter (HOSPITAL_COMMUNITY): Payer: Self-pay

## 2024-03-23 DIAGNOSIS — M62830 Muscle spasm of back: Secondary | ICD-10-CM

## 2024-03-23 DIAGNOSIS — R079 Chest pain, unspecified: Secondary | ICD-10-CM | POA: Diagnosis not present

## 2024-03-23 DIAGNOSIS — R10A2 Flank pain, left side: Secondary | ICD-10-CM

## 2024-03-23 MED ORDER — BACLOFEN 10 MG PO TABS: 10.0000 mg | ORAL_TABLET | Freq: Three times a day (TID) | ORAL | 0 refills | Status: DC

## 2024-03-23 MED ORDER — LIDOCAINE 5 % EX PTCH: 1.0000 | MEDICATED_PATCH | CUTANEOUS | 0 refills | Status: AC

## 2024-03-23 MED ORDER — ACETAMINOPHEN 325 MG PO TABS
975.0000 mg | ORAL_TABLET | Freq: Once | ORAL | Status: AC
  Administered 2024-03-23: 975 mg via ORAL

## 2024-03-23 MED ORDER — ACETAMINOPHEN 325 MG PO TABS
ORAL_TABLET | ORAL | Status: AC
  Filled 2024-03-23: qty 3

## 2024-03-23 NOTE — ED Provider Notes (Signed)
 [facility]    CSN: [id number] Arrival date & time: 03/23/24  1034      History   Chief Complaint Chief Complaint  Patient presents with   rib cage pain    HPI Allison [name] is a 73 y.o. female.   Patient with history of lung cancer that is currently undergoing chemotherapy, presents today due to 10/10 left-sided flank pain that happened suddenly when bendin bending laterally yesterday.  Patient states that movement, deep breathing, and palpation aggravate her pain.  Patient states that she has attempted to use Tylenol , heat, and fentanyl  patch for pain without any significant relief.     Past Medical History:  Diagnosis Date   Anemia    Anxiety    Arthritis    Asthma    exercise induced   Coronary artery disease    Depression    PMH   Dyspnea    GERD (gastroesophageal reflux disease)    Glaucoma    History of radiation therapy 01/05/2021   IMRT right lung  11/24/2020-01/05/2021  Dr Lynwood Nasuti   Hypertension    lung ca dx'd 11/2017   right   Malignant pleural effusion (HCC)    right   Nuclear sclerotic cataract of right eye 02/28/2021   Dr. Lonni Gaudy, cataract surgery February 2023   PONV (postoperative nausea and vomiting)    Pre-diabetes    Raynaud's disease    Raynaud's disease    Stroke (HCC) 01/2021   balance off, some express aphasia, weakness    Patient Active Problem List   Diagnosis Date Noted   Palliative care by specialist 09/09/2023   Acute bilateral deep vein thrombosis (DVT) of upper extremities (HCC) 09/07/2023   Abnormal glucose level 01/18/2022   Anemia 10/07/2021   Neutropenia 10/07/2021   Pseudophakia of right eye 09/22/2021   Port-A-Cath in place 07/07/2021   Protein-calorie malnutrition, severe 04/25/2021   Pneumothorax after biopsy 04/21/2021   Lung nodule 04/01/2021   Epiretinal membrane, right eye 02/28/2021   Nuclear sclerotic cataract of left eye 02/28/2021   Coronary artery disease of native artery of  native heart with stable angina pectoris    CVA (cerebral vascular accident) (HCC) 02/12/2021   Current use of long term anticoagulation 02/12/2021   Hypokalemia 02/12/2021   Leg DVT (deep venous thromboembolism), acute, bilateral (HCC) 02/08/2021   Aortic atherosclerosis 02/08/2021   Respiratory failure (HCC) 02/08/2021   Postobstructive pneumonia 02/07/2021   Pulmonary embolism (HCC) 02/07/2021   Elevated troponin 02/07/2021   Encounter to establish care 02/02/2021   Shortness of breath 02/02/2021   Cough 02/02/2021   Statin myopathy 12/03/2020   Acute non-recurrent frontal sinusitis 11/18/2020   NSCLC with EGFR mutation (HCC) 06/30/2020   Hx of chest tube placement 02/11/2020   History of chest tube placement 11/12/2019   Abnormal liver function 08/20/2019   Gastroesophageal reflux disease without esophagitis 08/20/2019   Generalized anxiety disorder 08/20/2019   Glaucoma 08/20/2019   Hyperlipidemia 08/20/2019   Menopausal syndrome 08/20/2019   Raynaud's phenomenon 08/20/2019   Vitamin D  deficiency 08/20/2019   Status post coronary artery stent placement    History of non-ST elevation myocardial infarction (NSTEMI) 06/12/2019   Hypertension 03/24/2019   Encounter for antineoplastic chemotherapy 01/22/2018   Adenocarcinoma of right lung, stage 4 (HCC) 01/04/2018   Goals of care, counseling/discussion 01/04/2018   Thyroid  nodule 12/11/2017   Pleural effusion, right 12/11/2017   Heel pain 03/29/2015   Insomnia 04/20/2014    Past Surgical History:  Procedure Laterality Date   ABDOMINAL HYSTERECTOMY     partial   BRONCHIAL BIOPSY  04/21/2021   Procedure: BRONCHIAL BIOPSIES;  Surgeon: Brenna Adine CROME, DO;  [facility]: [facility];  Service: Pulmonary;;   BRONCHIAL BRUSHINGS  04/21/2021   Procedure: BRONCHIAL BRUSHINGS;  Surgeon: Brenna Adine CROME, DO;  [facility]: [facility];  Service: Pulmonary;;   BRONCHIAL NEEDLE ASPIRATION BIOPSY  04/21/2021   Procedure: BRONCHIAL NEEDLE  ASPIRATION BIOPSIES;  Surgeon: Brenna Adine CROME, DO;  Location: [facility];  Service: Pulmonary;;   CHEST TUBE INSERTION Right 01/01/2018   Procedure: INSERTION PLEURAL DRAINAGE CATHETER;  Surgeon: Fleeta Hanford Coy, MD;  Location: [facility] OR;  Service: Thoracic;  Laterality: Right;   CHEST TUBE INSERTION  04/21/2021   Procedure: CHEST TUBE INSERTION;  Surgeon: Brenna Adine CROME, DO;  Location: [facility];  Service: Pulmonary;;   COLONOSCOPY     CORONARY STENT INTERVENTION N/A 06/16/2019   Procedure: CORONARY STENT INTERVENTION;  Surgeon: Dann Candyce RAMAN, MD;  [facility];  Service: Cardiovascular;  Laterality: N/A;   DILATION AND CURETTAGE OF UTERUS     EYE SURGERY     due to Glaucoma   IR IMAGING GUIDED PORT INSERTION  03/22/2021   IR PORT REPAIR CENTRAL VENOUS ACCESS DEVICE  04/15/2021   LEFT HEART CATH AND CORONARY ANGIOGRAPHY N/A 06/16/2019   Procedure: LEFT HEART CATH AND CORONARY ANGIOGRAPHY;  Surgeon: Dann Candyce RAMAN, MD;  Location: [facility] INVASIVE CV LAB;  Service: Cardiovascular;  Laterality: N/A;   REMOVAL OF PLEURAL DRAINAGE CATHETER Right 11/07/2018   Procedure: REMOVAL OF PLEURAL DRAINAGE CATHETER;  Surgeon: Fleeta Hanford Coy, MD;  Location: [facility] OR;  Service: Thoracic;  Laterality: Right;   ROTATOR CUFF REPAIR     TUBAL LIGATION     VIDEO BRONCHOSCOPY WITH ENDOBRONCHIAL NAVIGATION Bilateral 04/21/2021   Procedure: VIDEO BRONCHOSCOPY WITH ENDOBRONCHIAL NAVIGATION;  Surgeon: Brenna Adine CROME, DO;  Location[facility];  Service: Pulmonary;  Laterality: Bilateral;  ION   WISDOM TOOTH EXTRACTION      OB History   No obstetric history on file.      Home Medications    Prior to Admission medications   Medication Sig Start Date End Date Taking? Authorizing Provider  baclofen (LIORESAL) 10 MG tablet Take 1 tablet (10 mg total) by mouth 3 (three) times daily. 03/23/24  Yes Andra Krabbe C, PA-C  lidocaine  (LIDODERM ) 5 % Place 1 patch onto the skin daily.  Remove & Discard patch within 12 hours or as directed by MD 03/23/24  Yes Andra Krabbe BROCKS, PA-C  ALPRAZolam  (XANAX ) 0.25 MG tablet Take 1 tablet (0.25 mg total) by mouth 2 (two) times daily as needed for anxiety. May take 2 tablets (0.5 mg total) at bedtime. Patient taking differently: Take 0.25-0.5 mg by mouth 2 (two) times daily as needed for anxiety or sleep. 11/23/22   Pickenpack-Cousar, Athena N, NP  ALPRAZolam  (XANAX ) 0.5 MG tablet Take 1 tablet (0.5 mg total) by mouth 3 (three) times daily as needed. 12/07/23     amLODipine  (NORVASC ) 5 MG tablet Take 1 tablet (5 mg total) by mouth daily. 05/28/23   Jerilynn Lamarr HERO, NP  carvedilol  (COREG ) 12.5 MG tablet Take 1 tablet (12.5 mg total) by mouth 2 (two) times daily. 03/11/24   Kate Lonni CROME, MD  dexamethasone  (DECADRON ) 4 MG tablet Take 1 tablet twice a day the day before, day of, and day after treatment 08/23/23   Heilingoetter, Cassandra L, PA-C  dorzolamide -timolol  (COSOPT ) 22.3-6.8 MG/ML  ophthalmic solution Place 1 drop into both eyes 2 (two) times daily.    [provider]  doxycycline  (VIBRA -TABS) 100 MG tablet Take 1 tablet (100 mg total) by mouth 2 (two) times daily. Patient not taking: Reported on 03/23/2024 09/19/23   Ruthell Lauraine FALCON, NP  enoxaparin  (LOVENOX ) 60 MG/0.6ML injection Inject 0.55 mLs (55 mg total) into the skin every 12 (twelve) hours. 09/10/23   Wouk, Devaughn Sayres, MD  estradiol (ESTRACE) 0.1 MG/GM vaginal cream Place a pea sized amount to upper vulvar are and just inside vagina 2-3 (two to three) times a week. 03/17/24     ezetimibe  (ZETIA ) 10 MG tablet Take 10 mg by mouth daily. 02/25/22   [provider]  famotidine  (PEPCID ) 20 MG tablet TAKE 1 TABLET BY MOUTH TWICE A DAY 01/09/24   Heilingoetter, Cassandra L, PA-C  FeFum-FePoly-FA-B Cmp-C-Biot (FOLIVANE-PLUS) CAPS Take 1 capsule by mouth in the morning. 11/08/23   Heilingoetter, Cassandra L, PA-C  fentaNYL  (DURAGESIC ) 12 MCG/HR Place 1 patch onto the  skin every 3 (three) days. 01/21/24   Pickenpack-Cousar, Athena N, NP  FLUoxetine  (PROZAC ) 20 MG capsule Take 1 capsule (20 mg total) by mouth daily. 03/05/24   Pickenpack-Cousar, Fannie SAILOR, NP  folic acid  (FOLVITE ) 1 MG tablet TAKE 1 TABLET BY MOUTH EVERY DAY 02/09/24   Heilingoetter, Cassandra L, PA-C  gabapentin  (NEURONTIN ) 250 MG/5ML solution Take 4 mLs (200 mg total) by mouth at bedtime. Patient not taking: Reported on 03/23/2024 09/10/23   Wouk, Devaughn Sayres, MD  Glycopyrrolate -Formoterol  (BEVESPI  AEROSPHERE) 9-4.8 MCG/ACT AERO Inhale 2 puffs into the lungs 2 (two) times daily. 08/20/23   Groce, [name] F, NP  Glycopyrrolate -Formoterol  (BEVESPI  AEROSPHERE) 9-4.8 MCG/ACT AERO Inhale 2 puffs into the lungs 2 (two) times daily. 12/04/23   Ruthell Lauraine FALCON, NP  guaiFENesin  (MUCINEX ) 600 MG 12 hr tablet Take 1 tablet (600 mg total) by mouth 2 (two) times daily. 03/03/24   Sherrod Sherrod, MD  HYDROcodone  bit-homatropine [facility]) 5-1.5 MG/5ML syrup Take 5 mLs by mouth every 6 (six) hours as needed for cough. 03/03/24   Sherrod Sherrod, MD  hyoscyamine  (LEVSIN  SL) 0.125 MG SL tablet Place 1-2 tablets (0.125-0.25mg ) under the tongue every 6 (six) hours as needed for cramping. 05/02/23   Pickenpack-Cousar, Athena N, NP  isosorbide  mononitrate (IMDUR ) 30 MG 24 hr tablet Take 1 tablet (30 mg total) by mouth daily. 09/26/23   Kate Lonni CROME, MD  latanoprost  (XALATAN ) 0.005 % ophthalmic solution Place 1 drop into both eyes at bedtime.  12/14/17   [provider]  lidocaine -prilocaine  (EMLA ) cream Apply 1 Application topically as needed. 12/31/23   Heilingoetter, Cassandra L, PA-C  nitroGLYCERIN  (NITROSTAT ) 0.4 MG SL tablet Place 1 tablet (0.4 mg total) under the tongue every 5 (five) minutes as needed for chest pain. 02/21/24 05/21/24  Kate Lonni CROME, MD  ondansetron  (ZOFRAN -ODT) 4 MG disintegrating tablet Take 1 tablet (4 mg total) by mouth every 8 (eight) hours as needed for nausea or vomiting.  07/07/21   Heilingoetter, Cassandra L, PA-C  osimertinib  mesylate (TAGRISSO ) 80 MG tablet Take 1 tablet (80 mg total) by mouth daily. 11/15/23   Heilingoetter, Cassandra L, PA-C  oxyCODONE  (OXY IR/ROXICODONE ) 5 MG immediate release tablet Take 1-2 tablets (5-10 mg total) by mouth every 4 (four) hours as needed for severe pain (pain score 7-10) or breakthrough pain. 11/14/23   Pickenpack-Cousar, Athena N, NP  predniSONE  (DELTASONE ) 10 MG tablet Take 1 tablet (10 mg total) by mouth daily with breakfast. 03/03/24  Heilingoetter, Cassandra L, PA-C  prochlorperazine  (COMPAZINE ) 10 MG tablet Take 1 tablet (10 mg total) by mouth every 6 (six) hours as needed. 08/23/23   Heilingoetter, Cassandra L, PA-C  REPATHA  SURECLICK 140 MG/ML SOAJ Inject 140 mg into the skin every 14 (fourteen) days. 02/21/24   Kate Lonni CROME, MD  senna-docusate (SENNA S) 8.6-50 MG tablet Take 2 tablets by mouth at bedtime. 09/11/23   Pickenpack-Cousar, Athena N, NP  triamcinolone  cream (KENALOG ) 0.1 % Apply a thin layer topically to the affected area 2 (two) times daily. 03/17/24     Wheat Dextrin (BENEFIBER DRINK MIX) PACK Take 1 packet by mouth daily. Patient not taking: Reported on 03/23/2024    [provider]    Family History Family History  Problem Relation Age of Onset   Heart disease Sister    Heart disease Brother    Lung cancer Other     Social History Social History   Tobacco Use   Smoking status: Never    Passive exposure: Past   Smokeless tobacco: Never  Vaping Use   Vaping status: Never Used  Substance Use Topics   Alcohol use: Not Currently    Comment: up to 3 drinks per week   Drug use: No    Comment: CBD oil      Allergies   Hydrocodone -acetaminophen , Other, Penicillins, Lactose, Vicodin [hydrocodone -acetaminophen ], Bacid, and Ipratropium-albuterol    Review of Systems Review of Systems   Physical Exam Triage Vital Signs ED Triage Vitals [03/23/24 1053]  Encounter Vitals Group      BP (!) 157/84     Girls Systolic BP Percentile      Girls Diastolic BP Percentile      Boys Systolic BP Percentile      Boys Diastolic BP Percentile      Pulse Rate 77     Resp 16     Temp 98.2 F (36.8 C)     Temp Source Oral     SpO2 97 %     Weight      Height      Head Circumference      Peak Flow      Pain Score 10     Pain Loc      Pain Education      Exclude from Growth Chart    No data found.  Updated Vital Signs BP (!) 157/84 (BP Location: Right Arm)   Pulse 77   Temp 98.2 F (36.8 C) (Oral)   Resp 16   LMP  (LMP Unknown)   SpO2 97%   Visual Acuity Right Eye Distance:   Left Eye Distance:   Bilateral Distance:    Right Eye Near:   Left Eye Near:    Bilateral Near:     Physical Exam Vitals and nursing note reviewed.  Constitutional:      General: She is not in acute distress.    Appearance: Normal appearance. She is not ill-appearing, toxic-appearing or diaphoretic.  Eyes:     General: No scleral icterus. Cardiovascular:     Rate and Rhythm: Normal rate and regular rhythm.     Heart sounds: Normal heart sounds.  Pulmonary:     Effort: Pulmonary effort is normal. No respiratory distress.     Breath sounds: Normal breath sounds. No wheezing or rhonchi.  Musculoskeletal:       Back:     Comments: Tenderness to palpation of left flank  Skin:    General: Skin is warm.  Neurological:  Mental Status: She is alert and oriented to person, place, and time.  Psychiatric:        Mood and Affect: Mood normal.        Behavior: Behavior normal.      UC Treatments / Results  Labs (all labs ordered are listed, but only abnormal results are displayed) Labs Reviewed - No data to display  EKG   Radiology DG Chest 2 View Result Date: 03/23/2024 CLINICAL DATA:  chest pain EXAM: CHEST - 2 VIEW COMPARISON:  September 19, 2023 FINDINGS: The cardiomediastinal silhouette is unchanged in contour.RIGHT chest port with tip terminating over the superior  cavoatrial junction. Similar appearance of a RIGHT-sided pleural effusion and asymmetric RIGHT apical pleuroparenchymal opacity. No pneumothorax. No acute pleuroparenchymal abnormality in the LEFT lung. Visualized abdomen is unremarkable. Minimal degenerative changes of the thoracic spine. IMPRESSION: Similar appearance of a RIGHT-sided pleural effusion and asymmetric RIGHT apical pleuroparenchymal opacity consistent with sequela of known RIGHT sided malignancy. Electronically Signed   By: [name] Salter M.D.   On: 03/23/2024 11:39    Procedures Procedures (including critical care time)  Medications Ordered in UC Medications  acetaminophen  (TYLENOL ) tablet 975 mg (975 mg Oral Given 03/23/24 1132)    Initial Impression / Assessment and Plan / UC Course  I have reviewed the triage vital signs and the nursing notes.  Pertinent labs & imaging results that were available during my care of the patient were reviewed by me and considered in my medical decision making (see chart for details).     Left flank pain-symptoms most likely muscle strain with muscle spasm.  Patient was prescribed lidocaine  and muscle relaxer for symptom.  X-ray performed, showed no acute abnormality. Final Clinical Impressions(s) / UC Diagnoses   Final diagnoses:  Chest pain, unspecified type  Left flank pain  Back spasm   Discharge Instructions   None    ED Prescriptions     Medication Sig Dispense Auth. Provider   lidocaine  (LIDODERM ) 5 % Place 1 patch onto the skin daily. Remove & Discard patch within 12 hours or as directed by MD 9 patch Andra [name] C, PA-C   baclofen (LIORESAL) 10 MG tablet Take 1 tablet (10 mg total) by mouth 3 (three) times daily. 30 each Andra [name] BROCKS, PA-C      PDMP not reviewed this encounter.   Andra [name] BROCKS, PA-C 03/23/24 1154

## 2024-03-23 NOTE — ED Triage Notes (Addendum)
 Patient reports that she has lung cancer and was at the Cancer walk yesterday. Patient states she leaned over to pick something up from the ground and is having left rib cage pain. Patient  states she  was able to sleep last night. Patient states pain worse this AM.  Patient states she currently has a Fentanyl  patch on, but is not helping.

## 2024-03-24 ENCOUNTER — Inpatient Hospital Stay: Attending: Internal Medicine | Admitting: Physician Assistant

## 2024-03-24 ENCOUNTER — Inpatient Hospital Stay (HOSPITAL_BASED_OUTPATIENT_CLINIC_OR_DEPARTMENT_OTHER): Admitting: Nurse Practitioner

## 2024-03-24 ENCOUNTER — Encounter: Payer: Self-pay | Admitting: Nurse Practitioner

## 2024-03-24 ENCOUNTER — Other Ambulatory Visit: Payer: Self-pay

## 2024-03-24 ENCOUNTER — Inpatient Hospital Stay: Attending: Internal Medicine

## 2024-03-24 VITALS — BP 163/71 | HR 82 | Temp 97.6°F | Resp 17 | Ht 64.0 in | Wt 134.0 lb

## 2024-03-24 DIAGNOSIS — Z515 Encounter for palliative care: Secondary | ICD-10-CM | POA: Diagnosis not present

## 2024-03-24 DIAGNOSIS — Z1509 Genetic susceptibility to other malignant neoplasm: Secondary | ICD-10-CM

## 2024-03-24 DIAGNOSIS — Z5111 Encounter for antineoplastic chemotherapy: Secondary | ICD-10-CM | POA: Diagnosis not present

## 2024-03-24 DIAGNOSIS — C3491 Malignant neoplasm of unspecified part of right bronchus or lung: Secondary | ICD-10-CM

## 2024-03-24 DIAGNOSIS — Z15068 Genetic susceptibility to other malignant neoplasm of digestive system: Secondary | ICD-10-CM

## 2024-03-24 DIAGNOSIS — Z23 Encounter for immunization: Secondary | ICD-10-CM | POA: Diagnosis not present

## 2024-03-24 DIAGNOSIS — C3411 Malignant neoplasm of upper lobe, right bronchus or lung: Secondary | ICD-10-CM | POA: Insufficient documentation

## 2024-03-24 DIAGNOSIS — K5903 Drug induced constipation: Secondary | ICD-10-CM

## 2024-03-24 DIAGNOSIS — C3490 Malignant neoplasm of unspecified part of unspecified bronchus or lung: Secondary | ICD-10-CM

## 2024-03-24 DIAGNOSIS — R530 Neoplastic (malignant) related fatigue: Secondary | ICD-10-CM

## 2024-03-24 DIAGNOSIS — G893 Neoplasm related pain (acute) (chronic): Secondary | ICD-10-CM

## 2024-03-24 DIAGNOSIS — R053 Chronic cough: Secondary | ICD-10-CM

## 2024-03-24 LAB — CBC WITH DIFFERENTIAL (CANCER CENTER ONLY)
Abs Immature Granulocytes: 0.02 K/uL (ref 0.00–0.07)
Basophils Absolute: 0 K/uL (ref 0.0–0.1)
Basophils Relative: 0 %
Eosinophils Absolute: 0 K/uL (ref 0.0–0.5)
Eosinophils Relative: 0 %
HCT: 36.5 % (ref 36.0–46.0)
Hemoglobin: 11.9 g/dL — ABNORMAL LOW (ref 12.0–15.0)
Immature Granulocytes: 0 %
Lymphocytes Relative: 14 %
Lymphs Abs: 0.8 K/uL (ref 0.7–4.0)
MCH: 28.5 pg (ref 26.0–34.0)
MCHC: 32.6 g/dL (ref 30.0–36.0)
MCV: 87.3 fL (ref 80.0–100.0)
Monocytes Absolute: 0.2 K/uL (ref 0.1–1.0)
Monocytes Relative: 3 %
Neutro Abs: 4.8 K/uL (ref 1.7–7.7)
Neutrophils Relative %: 83 %
Platelet Count: 227 K/uL (ref 150–400)
RBC: 4.18 MIL/uL (ref 3.87–5.11)
RDW: 15.5 % (ref 11.5–15.5)
WBC Count: 5.8 K/uL (ref 4.0–10.5)
nRBC: 0 % (ref 0.0–0.2)

## 2024-03-24 LAB — CMP (CANCER CENTER ONLY)
ALT: 34 U/L (ref 0–44)
AST: 23 U/L (ref 15–41)
Albumin: 4.1 g/dL (ref 3.5–5.0)
Alkaline Phosphatase: 56 U/L (ref 38–126)
Anion gap: 7 (ref 5–15)
BUN: 18 mg/dL (ref 8–23)
CO2: 25 mmol/L (ref 22–32)
Calcium: 9.8 mg/dL (ref 8.9–10.3)
Chloride: 107 mmol/L (ref 98–111)
Creatinine: 1.18 mg/dL — ABNORMAL HIGH (ref 0.44–1.00)
GFR, Estimated: 49 mL/min — ABNORMAL LOW (ref >60)
Glucose, Bld: 178 mg/dL — ABNORMAL HIGH (ref 70–99)
Potassium: 4 mmol/L (ref 3.5–5.1)
Sodium: 139 mmol/L (ref 135–145)
Total Bilirubin: 0.3 mg/dL (ref 0.0–1.2)
Total Protein: 7.1 g/dL (ref 6.5–8.1)

## 2024-03-24 MED ORDER — CYANOCOBALAMIN 1000 MCG/ML IJ SOLN: 1000.0000 ug | Freq: Once | INTRAMUSCULAR | Status: DC

## 2024-03-24 MED ORDER — INFLUENZA VAC SPLIT HIGH-DOSE 0.5 ML IM SUSY
0.5000 mL | PREFILLED_SYRINGE | Freq: Once | INTRAMUSCULAR | Status: AC
  Administered 2024-03-24: 0.5 mL via INTRAMUSCULAR
  Filled 2024-03-24: qty 0.5

## 2024-03-24 MED ORDER — PROCHLORPERAZINE MALEATE 10 MG PO TABS
10.0000 mg | ORAL_TABLET | Freq: Once | ORAL | Status: AC
  Administered 2024-03-24: 10 mg via ORAL
  Filled 2024-03-24: qty 1

## 2024-03-24 MED ORDER — FENTANYL 12 MCG/HR TD PT72: 1.0000 | MEDICATED_PATCH | TRANSDERMAL | 0 refills | Status: DC

## 2024-03-24 MED ORDER — SODIUM CHLORIDE 0.9 % IV SOLN
400.0000 mg/m2 | Freq: Once | INTRAVENOUS | Status: AC
  Administered 2024-03-24: 600 mg via INTRAVENOUS
  Filled 2024-03-24: qty 20

## 2024-03-24 MED ORDER — HYDROCODONE BIT-HOMATROP MBR 5-1.5 MG/5ML PO SOLN: 5.0000 mL | Freq: Four times a day (QID) | ORAL | 0 refills | Status: AC | PRN

## 2024-03-24 MED ORDER — SODIUM CHLORIDE 0.9 % IV SOLN: INTRAVENOUS | Status: DC

## 2024-03-24 NOTE — Patient Instructions (Signed)
 [facility] - A DEPT OF [facility]  Discharge Instructions: Thank you for choosing [facility] to provide your oncology and hematology care.   If you have a lab appointment with the Cancer Center, please go directly to the Cancer Center and check in at the registration area.   Wear comfortable clothing and clothing appropriate for easy access to any Portacath or PICC line.   We strive to give you quality time with your provider. You may need to reschedule your appointment if you arrive late (15 or more minutes).  Arriving late affects you and other patients whose appointments are after yours.  Also, if you miss three or more appointments without notifying the office, you may be dismissed from the clinic at the provider's discretion.      For prescription refill requests, have your pharmacy contact our office and allow 72 hours for refills to be completed.    Today you received the following chemotherapy and/or immunotherapy agents: PEMEtrexed  Disodium (ALIMTA )      To help prevent nausea and vomiting after your treatment, we encourage you to take your nausea medication as directed.  BELOW ARE SYMPTOMS THAT SHOULD BE REPORTED IMMEDIATELY: *FEVER GREATER THAN 100.4 F (38 C) OR HIGHER *CHILLS OR SWEATING *NAUSEA AND VOMITING THAT IS NOT CONTROLLED WITH YOUR NAUSEA MEDICATION *UNUSUAL SHORTNESS OF BREATH *UNUSUAL BRUISING OR BLEEDING *URINARY PROBLEMS (pain or burning when urinating, or frequent urination) *BOWEL PROBLEMS (unusual diarrhea, constipation, pain near the anus) TENDERNESS IN MOUTH AND THROAT WITH OR WITHOUT PRESENCE OF ULCERS (sore throat, sores in mouth, or a toothache) UNUSUAL RASH, SWELLING OR PAIN  UNUSUAL VAGINAL DISCHARGE OR ITCHING   Items with * indicate a potential emergency and should be followed up as soon as possible or go to the Emergency Department if any problems should occur.  Please show the CHEMOTHERAPY ALERT  CARD or IMMUNOTHERAPY ALERT CARD at check-in to the Emergency Department and triage nurse.  Should you have questions after your visit or need to cancel or reschedule your appointment, please contact [facility] - A DEPT OF [facility]  Dept: [phone]  and follow the prompts.  Office hours are 8:00 a.m. to 4:30 p.m. Monday - Friday. Please note that voicemails left after 4:00 p.m. may not be returned until the following business day.  We are closed weekends and major holidays. You have access to a nurse at all times for urgent questions. Please call the main number to the clinic Dept: [phone] and follow the prompts.   For any non-urgent questions, you may also contact your provider using MyChart. We now offer e-Visits for anyone 8 and older to request care online for non-urgent symptoms. For details visit [email or web address].   Also download the MyChart app! Go to the app store, search MyChart, open the app, select [facility], and log in with your MyChart username and password.

## 2024-03-24 NOTE — Progress Notes (Signed)
 Patient OK to receive Flu-vaccine after todays tx per Cassie, PA.

## 2024-03-24 NOTE — Progress Notes (Signed)
 Palliative Medicine [facility]  [phone] [phone]   Name: Allison [name] Date: 03/24/2024 MRN: [id number]  DOB: Nov 04, 1950  Patient Care Team: [name], MD as PCP - General (Internal Medicine) Kate Lonni LITTIE, MD as PCP - Cardiology (Cardiology) Shannon Agent, MD as Consulting Physician (Radiation Oncology) Pickenpack-Cousar, Fannie SAILOR, NP as Nurse Practitioner (Nurse Practitioner) Evertt Lonell BROCKS, RN as Oncology Nurse Navigator (Oncology) Sherrod Sherrod, MD as Consulting Physician (Oncology) Cloria Annabella LITTIE, DO (Geriatric Medicine) Prentis Duwaine BROCKS, RN as Oncology Nurse Navigator   INTERVAL HISTORY:Allison [name] is a 73 y.o. female with stage IV non-small cell lung cancer (12/2017), hypertension, CVA, CAD, DVT/PE (on Lovenox ), and GERD.  Palliative ask to see for symptom management and goals of care.   SOCIAL HISTORY:     reports that she has never smoked. She has been exposed to tobacco smoke. She has never used smokeless tobacco. She reports that she does not currently use alcohol. She reports that she does not use drugs.  ADVANCE DIRECTIVES:  MOST and AD on file   CODE STATUS: DNR  PAST MEDICAL HISTORY: Past Medical History:  Diagnosis Date   Anemia    Anxiety    Arthritis    Asthma    exercise induced   Coronary artery disease    Depression    PMH   Dyspnea    GERD (gastroesophageal reflux disease)    Glaucoma    History of radiation therapy 01/05/2021   IMRT right lung  11/24/2020-01/05/2021  Dr Agent Shannon   Hypertension    lung ca dx'd 11/2017   right   Malignant pleural effusion (HCC)    right   Nuclear sclerotic cataract of right eye 02/28/2021   Dr. Lonni Gaudy, cataract surgery February 2023   PONV (postoperative nausea and vomiting)    Pre-diabetes    Raynaud's disease    Raynaud's disease    Stroke (HCC) 01/2021   balance off, some express aphasia, weakness    ALLERGIES:  is  allergic to hydrocodone -acetaminophen , other, penicillins, lactose, vicodin [hydrocodone -acetaminophen ], bacid, and ipratropium-albuterol .  MEDICATIONS:  Current Outpatient Medications  Medication Sig Dispense Refill   ALPRAZolam  (XANAX ) 0.25 MG tablet Take 1 tablet (0.25 mg total) by mouth 2 (two) times daily as needed for anxiety. May take 2 tablets (0.5 mg total) at bedtime. (Patient taking differently: Take 0.25-0.5 mg by mouth 2 (two) times daily as needed for anxiety or sleep.) 60 tablet 0   ALPRAZolam  (XANAX ) 0.5 MG tablet Take 1 tablet (0.5 mg total) by mouth 3 (three) times daily as needed. 90 tablet 3   amLODipine  (NORVASC ) 5 MG tablet Take 1 tablet (5 mg total) by mouth daily. 90 tablet 3   baclofen (LIORESAL) 10 MG tablet Take 1 tablet (10 mg total) by mouth 3 (three) times daily. 30 each 0   carvedilol  (COREG ) 12.5 MG tablet Take 1 tablet (12.5 mg total) by mouth 2 (two) times daily. 180 tablet 3   dexamethasone  (DECADRON ) 4 MG tablet Take 1 tablet twice a day the day before, day of, and day after treatment 40 tablet 2   dorzolamide -timolol  (COSOPT ) 22.3-6.8 MG/ML ophthalmic solution Place 1 drop into both eyes 2 (two) times daily.     doxycycline  (VIBRA -TABS) 100 MG tablet Take 1 tablet (100 mg total) by mouth 2 (two) times daily. (Patient not taking: Reported on 03/23/2024) 14 tablet 0   enoxaparin  (LOVENOX ) 60 MG/0.6ML injection Inject 0.55 mLs (55 mg total)  into the skin every 12 (twelve) hours. 100 mL 1   estradiol (ESTRACE) 0.1 MG/GM vaginal cream Place a pea sized amount to upper vulvar are and just inside vagina 2-3 (two to three) times a week. 42.5 g 1   ezetimibe  (ZETIA ) 10 MG tablet Take 10 mg by mouth daily.     famotidine  (PEPCID ) 20 MG tablet TAKE 1 TABLET BY MOUTH TWICE A DAY 180 tablet 1   FeFum-FePoly-FA-B Cmp-C-Biot (FOLIVANE-PLUS) CAPS Take 1 capsule by mouth in the morning. 90 capsule 0   fentaNYL  (DURAGESIC ) 12 MCG/HR Place 1 patch onto the skin every 3 (three)  days. 10 patch 0   FLUoxetine  (PROZAC ) 20 MG capsule Take 1 capsule (20 mg total) by mouth daily. 90 capsule 3   folic acid  (FOLVITE ) 1 MG tablet TAKE 1 TABLET BY MOUTH EVERY DAY 90 tablet 1   gabapentin  (NEURONTIN ) 250 MG/5ML solution Take 4 mLs (200 mg total) by mouth at bedtime. (Patient not taking: Reported on 03/23/2024) 470 mL 1   Glycopyrrolate -Formoterol  (BEVESPI  AEROSPHERE) 9-4.8 MCG/ACT AERO Inhale 2 puffs into the lungs 2 (two) times daily. 10.7 g 3   Glycopyrrolate -Formoterol  (BEVESPI  AEROSPHERE) 9-4.8 MCG/ACT AERO Inhale 2 puffs into the lungs 2 (two) times daily. 3 each 3   guaiFENesin  (MUCINEX ) 600 MG 12 hr tablet Take 1 tablet (600 mg total) by mouth 2 (two) times daily. 60 tablet 0   HYDROcodone  bit-homatropine (HYCODAN) 5-1.5 MG/5ML syrup Take 5 mLs by mouth every 6 (six) hours as needed for cough. 240 mL 0   hyoscyamine  (LEVSIN  SL) 0.125 MG SL tablet Place 1-2 tablets (0.125-0.25mg ) under the tongue every 6 (six) hours as needed for cramping. 30 tablet 3   isosorbide  mononitrate (IMDUR ) 30 MG 24 hr tablet Take 1 tablet (30 mg total) by mouth daily. 90 tablet 3   latanoprost  (XALATAN ) 0.005 % ophthalmic solution Place 1 drop into both eyes at bedtime.      lidocaine  (LIDODERM ) 5 % Place 1 patch onto the skin daily. Remove & Discard patch within 12 hours or as directed by MD 9 patch 0   lidocaine -prilocaine  (EMLA ) cream Apply 1 Application topically as needed. 30 g 0   nitroGLYCERIN  (NITROSTAT ) 0.4 MG SL tablet Place 1 tablet (0.4 mg total) under the tongue every 5 (five) minutes as needed for chest pain. 25 tablet 3   ondansetron  (ZOFRAN -ODT) 4 MG disintegrating tablet Take 1 tablet (4 mg total) by mouth every 8 (eight) hours as needed for nausea or vomiting. 30 tablet 1   osimertinib  mesylate (TAGRISSO ) 80 MG tablet Take 1 tablet (80 mg total) by mouth daily. 30 tablet 4   oxyCODONE  (OXY IR/ROXICODONE ) 5 MG immediate release tablet Take 1-2 tablets (5-10 mg total) by mouth every 4  (four) hours as needed for severe pain (pain score 7-10) or breakthrough pain. 60 tablet 0   predniSONE  (DELTASONE ) 10 MG tablet Take 1 tablet (10 mg total) by mouth daily with breakfast. 30 tablet 2   prochlorperazine  (COMPAZINE ) 10 MG tablet Take 1 tablet (10 mg total) by mouth every 6 (six) hours as needed. 30 tablet 2   REPATHA  SURECLICK 140 MG/ML SOAJ Inject 140 mg into the skin every 14 (fourteen) days. 6 mL 3   senna-docusate (SENNA S) 8.6-50 MG tablet Take 2 tablets by mouth at bedtime. 60 tablet 3   triamcinolone  cream (KENALOG ) 0.1 % Apply a thin layer topically to the affected area 2 (two) times daily. 30 g 3   Wheat Dextrin (BENEFIBER  DRINK MIX) PACK Take 1 packet by mouth daily. (Patient not taking: Reported on 03/23/2024)     No current facility-administered medications for this visit.   Assessment NAD RRR Normal breathing pattern AAO x3  VITAL SIGNS: LMP  (LMP Unknown)  There were no vitals filed for this visit.  Estimated body mass index is 23 kg/m as calculated from the following:   Height as of an earlier encounter on 03/24/24: 5' 4 (1.626 m).   Weight as of an earlier encounter on 03/24/24: 134 lb (60.8 kg).   PERFORMANCE STATUS (ECOG) : 2 - Symptomatic, <50% confined to bed  IMPRESSION: Discussed the use of AI scribe software for clinical note transcription with the patient, who gave verbal consent to proceed.  History of Present Illness Allison [name] is a 73 year old female who was seen during infusion for symptom management follow-up. Denies concerns of nausea, vomiting, constipation, or diarrhea that is uncontrolled. Reports her bowel movements are regular.  Appetite continues to fluctuates. Some days are better than others.  Weight stable at 134lbs.  She experienced a muscle pull on the left side [place]. The incident occurred when she bent down to pick up an orange that had rolled out of her bag, resulting in  sudden pain halfway down. Patient was seen at local urgent care where an x-ray was performed, showing no fractures. She was prescribed baclofen, which she took the previous night, resulting in a good night's sleep. She has not taken baclofen today due to concerns about mixing it with her chemotherapy medications but plans to take it later after confirming it is safe. She also took two Tylenol  before this visit.  She inquires about receiving a flu shot, expressing concern about its compatibility with her current treatments. She typically receives the flu shot annually but is cautious due to her ongoing chemotherapy. Per Cassie, PA ok to administered post treatment today.   Allison [name] reports overall pain is well managed. Sometimes pain is more intense pending level of activity. Does not require daily use of breakthrough medication. Her current regimen consiste of fentanyl  patch 12mcg and oxycodone  5-10mg  as needed. No adjustments to regimen at this time.   All questions answered and support provided.   Goals of Care Allison [name] and family continue to remain hopeful for stability with treatment.  Her goal is to continue to treat the treatable allow her every opportunity to continue to thrive while aggressively managing her symptoms.  Her quality of life is most important to her and her family.  I discussed the importance of continued conversation with family and their medical providers regarding overall plan of care and treatment options, ensuring decisions are within the context of the patients values and GOCs. Assessment & Plan Cancer related pain Fentanyl  patch and oxycodone  used for pain control. Reports pain is controlled most days. Due to concerns of drowsiness patient did not start 25mcg patch. Will consider in the future if symptoms remain consistent and more intense.  - Continue oxycodone  5-10 mg every 4-6 hours as needed. - Fentanyl  12 mcg patch for long-acting pain relief, change every  three days. -Continue Xanax  as needed for anxiety. -Will continue to closely monitor and adjust regimen as needed  Constipation Much improved with use of Senna.  -Continue Senna-S 2 tablets at bedtime.   Anxiety/Depression Currently taking 20 mg of Prozac  daily, which helps with symptoms. Issue with previous prescriber, anticipates needing a refill in the  next three to four weeks. - Continue Prozac  20 mg daily   Malignant neoplasm under active chemotherapy Currently undergoing chemotherapy. Baclofen is confirmed safe for use with chemotherapy. Coordination with the oncology team is needed for flu shot timing due to potential interactions with chemotherapy. - Consult with the oncology team regarding flu shot timing in relation to chemotherapy schedule. Ok to receive flu vaccination per [place], [place] post treatment today  - Ensure medication refills are managed appropriately.  Left-sided lower back muscle strain Acute strain following a minor incident at a lung cancer walk event. No fractures on X-ray. Baclofen has been effective for muscle relaxation and improving sleep. - Apply heat to the affected area as recommended.  Follow-up I will plan to see patient back in 2-3 weeks. Sooner if needed.   Patient expressed understanding and was in agreement with this plan. She also understands that She can call the clinic at any time with any questions, concerns, or complaints.   Any controlled substances utilized were prescribed in the context of palliative care. PDMP has been reviewed.   Visit consisted of counseling and education dealing with the complex and emotionally intense issues of symptom management and palliative care in the setting of serious and potentially life-threatening illness.  Levon Borer, AGPCNP-BC  Palliative Medicine Team/[facility]

## 2024-03-25 ENCOUNTER — Telehealth: Payer: Self-pay

## 2024-03-25 NOTE — Telephone Encounter (Signed)
 Oral Oncology Patient Advocate Encounter   Was successful in securing patient an $72 grant from [employer] [facility]) to provide copayment coverage for Tagrisso .  This will keep the out of pocket expense at $0.     The billing information is as follows and has been shared with [facility].   Member ID: [id number] Group ID: [id number] RxBin: [id number] Dates of Eligibility: 12/25/23 through 03/23/25  Fund:  NSCLC  Lucie Lamer, CPhT Sophia  [facility] Pharmacy Technician Patient Advocate Specialist II [place], [place] Phone: [phone]  Fax: [phone] [email or web address]

## 2024-03-26 ENCOUNTER — Other Ambulatory Visit (HOSPITAL_COMMUNITY): Payer: Self-pay

## 2024-03-28 ENCOUNTER — Encounter: Payer: Self-pay | Admitting: Cardiology

## 2024-03-31 NOTE — Telephone Encounter (Signed)
 Order placed

## 2024-04-02 ENCOUNTER — Encounter (HOSPITAL_COMMUNITY): Payer: Self-pay

## 2024-04-02 ENCOUNTER — Telehealth (HOSPITAL_COMMUNITY): Payer: Self-pay

## 2024-04-02 ENCOUNTER — Other Ambulatory Visit (HOSPITAL_COMMUNITY): Payer: Self-pay

## 2024-04-02 MED ORDER — CYCLOSPORINE 0.05 % OP EMUL
1.0000 [drp] | Freq: Two times a day (BID) | OPHTHALMIC | 3 refills | Status: AC
  Filled 2024-04-02 (×2): qty 180, 90d supply, fill #0

## 2024-04-02 NOTE — Telephone Encounter (Signed)
 Called patient regarding pulmonary rehab, patient states she has done the program in the past and is very interested in returning. Patient states she feels she pulled a muscle in her left side recently and her mobility is more limited, would like to speak to [facility] to see her recommendation for when she can start.

## 2024-04-02 NOTE — Telephone Encounter (Signed)
 Pt insurance is active and benefits verified through [employer]. Co-pay $15, DED $0/$0 met, out of pocket $4,500/$4,500 met, co-insurance 0%. No pre-authorization required. 04/02/2024 @ 9:30am, spoke with Maude, REF# [id number].

## 2024-04-03 ENCOUNTER — Inpatient Hospital Stay

## 2024-04-03 ENCOUNTER — Other Ambulatory Visit (HOSPITAL_COMMUNITY): Payer: Self-pay

## 2024-04-03 NOTE — Progress Notes (Signed)
 [facility] CSW Progress Note  Clinical Social Work Intern contacted patient by phone to follow-up on question about appointment next month with NP. During virtual support group earlier in the day, pt expressed concern that she had a provider on her schedule she did not recognize, and CSW Intern offered to follow up, resulting in appointment being moved from NP to PA who pt sees regularly. Intern called pt to notify her of the change.    Interventions: Provided patient with information about upcoming appointment.       Follow Up Plan:  Patient will contact CSW with any support or resource needs    Allison [name] Clinical Social Work Intern [facility]

## 2024-04-04 ENCOUNTER — Other Ambulatory Visit (HOSPITAL_COMMUNITY): Payer: Self-pay

## 2024-04-14 ENCOUNTER — Other Ambulatory Visit

## 2024-04-14 ENCOUNTER — Ambulatory Visit

## 2024-04-14 ENCOUNTER — Ambulatory Visit: Admitting: Internal Medicine

## 2024-04-18 ENCOUNTER — Other Ambulatory Visit: Payer: Self-pay | Admitting: Physician Assistant

## 2024-04-22 ENCOUNTER — Other Ambulatory Visit (HOSPITAL_COMMUNITY): Payer: Self-pay

## 2024-04-22 ENCOUNTER — Other Ambulatory Visit: Payer: Self-pay

## 2024-04-22 ENCOUNTER — Encounter: Payer: Self-pay | Admitting: Physician Assistant

## 2024-04-22 ENCOUNTER — Inpatient Hospital Stay

## 2024-04-22 ENCOUNTER — Inpatient Hospital Stay (HOSPITAL_BASED_OUTPATIENT_CLINIC_OR_DEPARTMENT_OTHER): Admitting: Internal Medicine

## 2024-04-22 ENCOUNTER — Inpatient Hospital Stay: Attending: Internal Medicine

## 2024-04-22 ENCOUNTER — Encounter: Payer: Self-pay | Admitting: Internal Medicine

## 2024-04-22 VITALS — BP 150/82 | HR 83 | Temp 98.4°F | Resp 17 | Ht 64.0 in | Wt 133.0 lb

## 2024-04-22 VITALS — BP 160/70 | HR 74 | Temp 97.9°F | Resp 16

## 2024-04-22 DIAGNOSIS — C3411 Malignant neoplasm of upper lobe, right bronchus or lung: Secondary | ICD-10-CM | POA: Insufficient documentation

## 2024-04-22 DIAGNOSIS — Z923 Personal history of irradiation: Secondary | ICD-10-CM | POA: Diagnosis not present

## 2024-04-22 DIAGNOSIS — R61 Generalized hyperhidrosis: Secondary | ICD-10-CM | POA: Diagnosis not present

## 2024-04-22 DIAGNOSIS — T50995D Adverse effect of other drugs, medicaments and biological substances, subsequent encounter: Secondary | ICD-10-CM | POA: Diagnosis not present

## 2024-04-22 DIAGNOSIS — R600 Localized edema: Secondary | ICD-10-CM | POA: Insufficient documentation

## 2024-04-22 DIAGNOSIS — C3491 Malignant neoplasm of unspecified part of right bronchus or lung: Secondary | ICD-10-CM

## 2024-04-22 DIAGNOSIS — R5382 Chronic fatigue, unspecified: Secondary | ICD-10-CM

## 2024-04-22 DIAGNOSIS — R5383 Other fatigue: Secondary | ICD-10-CM | POA: Insufficient documentation

## 2024-04-22 DIAGNOSIS — Z5111 Encounter for antineoplastic chemotherapy: Secondary | ICD-10-CM | POA: Diagnosis present

## 2024-04-22 DIAGNOSIS — Z8673 Personal history of transient ischemic attack (TIA), and cerebral infarction without residual deficits: Secondary | ICD-10-CM | POA: Diagnosis not present

## 2024-04-22 LAB — CBC WITH DIFFERENTIAL (CANCER CENTER ONLY)
Abs Immature Granulocytes: 0.03 K/uL (ref 0.00–0.07)
Basophils Absolute: 0 K/uL (ref 0.0–0.1)
Basophils Relative: 0 %
Eosinophils Absolute: 0 K/uL (ref 0.0–0.5)
Eosinophils Relative: 0 %
HCT: 37.8 % (ref 36.0–46.0)
Hemoglobin: 12.4 g/dL (ref 12.0–15.0)
Immature Granulocytes: 1 %
Lymphocytes Relative: 12 %
Lymphs Abs: 0.7 K/uL (ref 0.7–4.0)
MCH: 28.6 pg (ref 26.0–34.0)
MCHC: 32.8 g/dL (ref 30.0–36.0)
MCV: 87.1 fL (ref 80.0–100.0)
Monocytes Absolute: 0.1 K/uL (ref 0.1–1.0)
Monocytes Relative: 2 %
Neutro Abs: 5.3 K/uL (ref 1.7–7.7)
Neutrophils Relative %: 85 %
Platelet Count: 168 K/uL (ref 150–400)
RBC: 4.34 MIL/uL (ref 3.87–5.11)
RDW: 15.4 % (ref 11.5–15.5)
WBC Count: 6.1 K/uL (ref 4.0–10.5)
nRBC: 0 % (ref 0.0–0.2)

## 2024-04-22 LAB — CMP (CANCER CENTER ONLY)
ALT: 27 U/L (ref 0–44)
AST: 27 U/L (ref 15–41)
Albumin: 4 g/dL (ref 3.5–5.0)
Alkaline Phosphatase: 56 U/L (ref 38–126)
Anion gap: 9 (ref 5–15)
BUN: 18 mg/dL (ref 8–23)
CO2: 25 mmol/L (ref 22–32)
Calcium: 9.4 mg/dL (ref 8.9–10.3)
Chloride: 106 mmol/L (ref 98–111)
Creatinine: 1.38 mg/dL — ABNORMAL HIGH (ref 0.44–1.00)
GFR, Estimated: 40 mL/min — ABNORMAL LOW (ref >60)
Glucose, Bld: 146 mg/dL — ABNORMAL HIGH (ref 70–99)
Potassium: 4 mmol/L (ref 3.5–5.1)
Sodium: 140 mmol/L (ref 135–145)
Total Bilirubin: 0.4 mg/dL (ref 0.0–1.2)
Total Protein: 7 g/dL (ref 6.5–8.1)

## 2024-04-22 LAB — TSH: TSH: 0.3 u[IU]/mL — ABNORMAL LOW (ref 0.350–4.500)

## 2024-04-22 MED ORDER — SODIUM CHLORIDE 0.9 % IV SOLN: INTRAVENOUS | Status: DC

## 2024-04-22 MED ORDER — SODIUM CHLORIDE 0.9 % IV SOLN
400.0000 mg/m2 | Freq: Once | INTRAVENOUS | Status: AC
  Administered 2024-04-22: 600 mg via INTRAVENOUS
  Filled 2024-04-22: qty 20

## 2024-04-22 MED ORDER — PROCHLORPERAZINE MALEATE 10 MG PO TABS
10.0000 mg | ORAL_TABLET | Freq: Once | ORAL | Status: AC
  Administered 2024-04-22: 10 mg via ORAL
  Filled 2024-04-22: qty 1

## 2024-04-22 NOTE — Progress Notes (Signed)
 [facility] [phone]   [phone]  OFFICE PROGRESS NOTE  Allison Iha, MD [address] [place] [address]  DIAGNOSIS: Stage IV (T2 a,N2, M1a) non-small cell lung cancer, adenocarcinoma diagnosed in July 2019 and presented with right upper lobe lung mass in addition to mediastinal lymphadenopathy as well as bilateral pulmonary nodules and malignant right pleural effusion.   Biomarker Findings Microsatellite status - Cannot Be Determined Tumor Mutational Burden - Cannot Be Determined Genomic Findings For a complete list of the genes assayed, please refer to the Appendix. EGFR exon 19 deletion ([id number]) TP53 Y220C 7 Disease relevant genes with no reportable alterations: KRAS, ALK, BRAF, MET, RET, ERBB2, ROS1    PRIOR THERAPY:  1) Status post right Pleurx catheter placement by Dr. Fleeta Ochoa for drainage of malignant right pleural effusion. 2) palliative radiotherapy to the enlarging right upper lobe lung mass and mediastinum under the care of Dr. Shannon expected to be completed on January 05, 2021. 3) Systemic chemotherapy with carboplatin  for AUC of 5 and Alimta  500 Mg/M2 every 3 weeks.  First dose 03/17/2021.  The patient will also continue her current treatment with Tagrisso  80 mg p.o. daily.  She is status post 26 cycles. Starting from cycle #7, she will be on Alimta  only 400 mg/m2.  Her treatment with Alimta  has been on hold for several months now.  She is currently on treatment with single agent Tagrisso  80 mg p.o. daily    CURRENT THERAPY: Tagrisso  80 mg p.o. daily.  First dose was given on 01/29/2018.  Status post 63 months of treatment.  She also resumed maintenance treatment with Alimta  400 mg/M2 on September 11, 2023 status post 10 cycles.  INTERVAL HISTORY: Allison [name] 73 y.o. female returns to the clinic today for follow-up visit accompanied by her boyfriend, Allison.  Her daughter Allison [name] was available by phone  during the visit.Discussed the use of AI scribe software for clinical note transcription with the patient, who gave verbal consent to proceed.  History of Present Illness Allison [name] is a 73 year old female who presents for evaluation before starting cycle number eleven of her second course of maintenance Alimta  and Tagrisso . She is accompanied by Allison.  She has been on systemic chemotherapy with carboplatin  and Alimta  every three weeks for six cycles, followed by maintenance Alimta  in addition to Tagrisso  since September 2022 due to disease progression.  She experienced severe fatigue yesterday, describing it as having 'no energy' and being unable to lift her head or walk easily, which she attributes to a peanut butter jelly sandwich eaten late at night. She woke up feeling better today.  She has been experiencing night sweats, described as 'sweating like a sweat hog' and being 'soaked' every night, requiring frequent changes of bed linens. She showers twice a day due to the sweating. No fever or chills but notes worsening of the sweating over time.  She mentions some swelling in her ankles, which she attributes to weight gain, as she is now up to 133 pounds. She uses [employer] for meals and has a social plan to meet a friend returning from a trip. 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  Her current medications include Alimta  and Tagrisso  as part of her maintenance therapy. She also takes [employer], prescribed by her gynecologist.     MEDICAL HISTORY: Past Medical History:  Diagnosis Date   Anemia  Anxiety    Arthritis    Asthma    exercise induced   Coronary artery disease    Depression    PMH   Dyspnea    GERD (gastroesophageal reflux disease)    Glaucoma    History of radiation therapy 01/05/2021   IMRT right lung  11/24/2020-01/05/2021  Dr Lynwood Nasuti   Hypertension    lung ca dx'd 11/2017   right   Malignant pleural effusion  (HCC)    right   Nuclear sclerotic cataract of right eye 02/28/2021   Dr. Lonni Gaudy, cataract surgery February 2023   PONV (postoperative nausea and vomiting)    Pre-diabetes    Raynaud's disease    Raynaud's disease    Stroke (HCC) 01/2021   balance off, some express aphasia, weakness    ALLERGIES:  is allergic to hydrocodone -acetaminophen , other, penicillins, lactose, vicodin [hydrocodone -acetaminophen ], bacid, and ipratropium-albuterol .  MEDICATIONS:  Current Outpatient Medications  Medication Sig Dispense Refill   ALPRAZolam  (XANAX ) 0.25 MG tablet Take 1 tablet (0.25 mg total) by mouth 2 (two) times daily as needed for anxiety. May take 2 tablets (0.5 mg total) at bedtime. (Patient taking differently: Take 0.25-0.5 mg by mouth 2 (two) times daily as needed for anxiety or sleep.) 60 tablet 0   ALPRAZolam  (XANAX ) 0.5 MG tablet Take 1 tablet (0.5 mg total) by mouth 3 (three) times daily as needed. 90 tablet 3   amLODipine  (NORVASC ) 5 MG tablet Take 1 tablet (5 mg total) by mouth daily. 90 tablet 3   baclofen (LIORESAL) 10 MG tablet Take 1 tablet (10 mg total) by mouth 3 (three) times daily. 30 each 0   carvedilol  (COREG ) 12.5 MG tablet Take 1 tablet (12.5 mg total) by mouth 2 (two) times daily. 180 tablet 3   cycloSPORINE (RESTASIS) 0.05 % ophthalmic emulsion Place 1 drop into both eyes 2 (two) times daily. 180 each 3   dexamethasone  (DECADRON ) 4 MG tablet Take 1 tablet twice a day the day before, day of, and day after treatment 40 tablet 2   dorzolamide -timolol  (COSOPT ) 22.3-6.8 MG/ML ophthalmic solution Place 1 drop into both eyes 2 (two) times daily.     doxycycline  (VIBRA -TABS) 100 MG tablet Take 1 tablet (100 mg total) by mouth 2 (two) times daily. (Patient not taking: Reported on 03/23/2024) 14 tablet 0   enoxaparin  (LOVENOX ) 60 MG/0.6ML injection Inject 0.55 mLs (55 mg total) into the skin every 12 (twelve) hours. 100 mL 1   estradiol (ESTRACE) 0.1 MG/GM vaginal cream Place  a pea sized amount to upper vulvar are and just inside vagina 2-3 (two to three) times a week. 42.5 g 1   ezetimibe  (ZETIA ) 10 MG tablet Take 10 mg by mouth daily.     famotidine  (PEPCID ) 20 MG tablet TAKE 1 TABLET BY MOUTH TWICE A DAY 180 tablet 1   FeFum-FePoly-FA-B Cmp-C-Biot (FOLIVANE-PLUS) CAPS Take 1 capsule by mouth in the morning. 90 capsule 0   fentaNYL  (DURAGESIC ) 12 MCG/HR Place 1 patch onto the skin every 3 (three) days. 10 patch 0   FLUoxetine  (PROZAC ) 20 MG capsule Take 1 capsule (20 mg total) by mouth daily. 90 capsule 3   folic acid  (FOLVITE ) 1 MG tablet TAKE 1 TABLET BY MOUTH EVERY DAY 90 tablet 1   gabapentin  (NEURONTIN ) 250 MG/5ML solution Take 4 mLs (200 mg total) by mouth at bedtime. (Patient not taking: Reported on 03/23/2024) 470 mL 1   Glycopyrrolate -Formoterol  (BEVESPI  AEROSPHERE) 9-4.8 MCG/ACT AERO Inhale 2 puffs into the lungs  2 (two) times daily. 10.7 g 3   Glycopyrrolate -Formoterol  (BEVESPI  AEROSPHERE) 9-4.8 MCG/ACT AERO Inhale 2 puffs into the lungs 2 (two) times daily. 3 each 3   guaiFENesin  (MUCINEX ) 600 MG 12 hr tablet Take 1 tablet (600 mg total) by mouth 2 (two) times daily. 60 tablet 0   HYDROcodone  bit-homatropine (HYCODAN) 5-1.5 MG/5ML syrup Take 5 mLs by mouth every 6 (six) hours as needed for cough. 240 mL 0   hyoscyamine  (LEVSIN  SL) 0.125 MG SL tablet Place 1-2 tablets (0.125-0.25mg ) under the tongue every 6 (six) hours as needed for cramping. 30 tablet 3   isosorbide  mononitrate (IMDUR ) 30 MG 24 hr tablet Take 1 tablet (30 mg total) by mouth daily. 90 tablet 3   latanoprost  (XALATAN ) 0.005 % ophthalmic solution Place 1 drop into both eyes at bedtime.      lidocaine  (LIDODERM ) 5 % Place 1 patch onto the skin daily. Remove & Discard patch within 12 hours or as directed by MD 9 patch 0   lidocaine -prilocaine  (EMLA ) cream Apply 1 Application topically as needed. 30 g 0   nitroGLYCERIN  (NITROSTAT ) 0.4 MG SL tablet Place 1 tablet (0.4 mg total) under the tongue  every 5 (five) minutes as needed for chest pain. 25 tablet 3   ondansetron  (ZOFRAN -ODT) 4 MG disintegrating tablet Take 1 tablet (4 mg total) by mouth every 8 (eight) hours as needed for nausea or vomiting. 30 tablet 1   osimertinib  mesylate (TAGRISSO ) 80 MG tablet Take 1 tablet (80 mg total) by mouth daily. 30 tablet 4   oxyCODONE  (OXY IR/ROXICODONE ) 5 MG immediate release tablet Take 1-2 tablets (5-10 mg total) by mouth every 4 (four) hours as needed for severe pain (pain score 7-10) or breakthrough pain. 60 tablet 0   predniSONE  (DELTASONE ) 10 MG tablet Take 1 tablet (10 mg total) by mouth daily with breakfast. 30 tablet 2   prochlorperazine  (COMPAZINE ) 10 MG tablet Take 1 tablet (10 mg total) by mouth every 6 (six) hours as needed. 30 tablet 2   REPATHA  SURECLICK 140 MG/ML SOAJ Inject 140 mg into the skin every 14 (fourteen) days. 6 mL 3   senna-docusate (SENNA S) 8.6-50 MG tablet Take 2 tablets by mouth at bedtime. 60 tablet 3   triamcinolone  cream (KENALOG ) 0.1 % Apply a thin layer topically to the affected area 2 (two) times daily. 30 g 3   Wheat Dextrin (BENEFIBER DRINK MIX) PACK Take 1 packet by mouth daily. (Patient not taking: Reported on 03/23/2024)     No current facility-administered medications for this visit.    SURGICAL HISTORY:  Past Surgical History:  Procedure Laterality Date   ABDOMINAL HYSTERECTOMY     partial   BRONCHIAL BIOPSY  04/21/2021   Procedure: BRONCHIAL BIOPSIES;  Surgeon: Brenna Adine CROME, DO;  Location: [facility];  Service: Pulmonary;;   BRONCHIAL BRUSHINGS  04/21/2021   Procedure: BRONCHIAL BRUSHINGS;  Surgeon: Brenna Adine CROME, DO;  Location: [facility];  Service: Pulmonary;;   BRONCHIAL NEEDLE ASPIRATION BIOPSY  04/21/2021   Procedure: BRONCHIAL NEEDLE ASPIRATION BIOPSIES;  Surgeon: Brenna Adine CROME, DO;  Location: [facility];  Service: Pulmonary;;   CHEST TUBE INSERTION Right 01/01/2018   Procedure: INSERTION PLEURAL DRAINAGE CATHETER;  Surgeon: Fleeta Hanford Coy, MD;  [facility] OR;  Service: Thoracic;  Laterality: Right;   CHEST TUBE INSERTION  04/21/2021   Procedure: CHEST TUBE INSERTION;  Surgeon: Brenna Adine CROME, DO;  Location: [facility];  Service: Pulmonary;;   COLONOSCOPY  CORONARY STENT INTERVENTION N/A 06/16/2019   Procedure: CORONARY STENT INTERVENTION;  Surgeon: Dann Candyce RAMAN, MD;  Location[facility] INVASIVE CV LAB;  Service: Cardiovascular;  Laterality: N/A;   DILATION AND CURETTAGE OF UTERUS     EYE SURGERY     due to Glaucoma   IR IMAGING GUIDED PORT INSERTION  03/22/2021   IR PORT REPAIR CENTRAL VENOUS ACCESS DEVICE  04/15/2021   LEFT HEART CATH AND CORONARY ANGIOGRAPHY N/A 06/16/2019   Procedure: LEFT HEART CATH AND CORONARY ANGIOGRAPHY;  Surgeon: Dann Candyce RAMAN, MD;  Location: [facility] INVASIVE CV LAB;  Service: Cardiovascular;  Laterality: N/A;   REMOVAL OF PLEURAL DRAINAGE CATHETER Right 11/07/2018   Procedure: REMOVAL OF PLEURAL DRAINAGE CATHETER;  Surgeon: Fleeta Hanford Coy, MD;  Location: [facility] OR;  Service: Thoracic;  Laterality: Right;   ROTATOR CUFF REPAIR     TUBAL LIGATION     VIDEO BRONCHOSCOPY WITH ENDOBRONCHIAL NAVIGATION Bilateral 04/21/2021   Procedure: VIDEO BRONCHOSCOPY WITH ENDOBRONCHIAL NAVIGATION;  Surgeon: Brenna Adine CROME, DO;  Location: [facility];  Service: Pulmonary;  Laterality: Bilateral;  ION   WISDOM TOOTH EXTRACTION      REVIEW OF SYSTEMS:  Constitutional: positive for fatigue and night sweats Eyes: negative Ears, nose, mouth, throat, and face: negative Respiratory: positive for dyspnea on exertion Cardiovascular: negative Gastrointestinal: negative Genitourinary:negative Integument/breast: negative Hematologic/lymphatic: negative Musculoskeletal:negative Neurological: negative Behavioral/Psych: negative Endocrine: negative Allergic/Immunologic: negative   PHYSICAL EXAMINATION: General appearance: alert, cooperative, appears stated age, fatigued, and no distress Head:  Normocephalic, without obvious abnormality, atraumatic Neck: no adenopathy, no JVD, supple, symmetrical, trachea midline, and thyroid  not enlarged, symmetric, no tenderness/mass/nodules Lymph nodes: Cervical, supraclavicular, and axillary nodes normal. Resp: clear to auscultation bilaterally Back: symmetric, no curvature. ROM normal. No CVA tenderness. Cardio: regular rate and rhythm, S1, S2 normal, no murmur, click, rub or gallop GI: soft, non-tender; bowel sounds normal; no masses,  no organomegaly Extremities: extremities normal, atraumatic, no cyanosis or edema Neurologic: Alert and oriented X 3, normal strength and tone. Normal symmetric reflexes. Normal coordination and gait  ECOG PERFORMANCE STATUS: 1 - Symptomatic but completely ambulatory  Blood pressure (!) 150/82, pulse 83, temperature 98.4 F (36.9 C), temperature source Temporal, resp. rate 17, height 5' 4 (1.626 m), weight 133 lb (60.3 kg), SpO2 100%.  LABORATORY DATA: Lab Results  Component Value Date   WBC 5.8 03/24/2024   HGB 11.9 (L) 03/24/2024   HCT 36.5 03/24/2024   MCV 87.3 03/24/2024   PLT 227 03/24/2024      Chemistry      Component Value Date/Time   NA 139 03/24/2024 0831   NA 140 03/04/2021 1516   K 4.0 03/24/2024 0831   CL 107 03/24/2024 0831   CO2 25 03/24/2024 0831   BUN 18 03/24/2024 0831   BUN 21 03/04/2021 1516   CREATININE 1.18 (H) 03/24/2024 0831      Component Value Date/Time   CALCIUM  9.8 03/24/2024 0831   ALKPHOS 56 03/24/2024 0831   AST 23 03/24/2024 0831   ALT 34 03/24/2024 0831   BILITOT 0.3 03/24/2024 0831       RADIOGRAPHIC STUDIES: DG Chest 2 View Result Date: 03/23/2024 CLINICAL DATA:  chest pain EXAM: CHEST - 2 VIEW COMPARISON:  September 19, 2023 FINDINGS: The cardiomediastinal silhouette is unchanged in contour.RIGHT chest port with tip terminating over the superior cavoatrial junction. Similar appearance of a RIGHT-sided pleural effusion and asymmetric RIGHT apical  pleuroparenchymal opacity. No pneumothorax. No acute pleuroparenchymal abnormality in the LEFT lung. Visualized  abdomen is unremarkable. Minimal degenerative changes of the thoracic spine. IMPRESSION: Similar appearance of a RIGHT-sided pleural effusion and asymmetric RIGHT apical pleuroparenchymal opacity consistent with sequela of known RIGHT sided malignancy. Electronically Signed   By: Allison [name] Salter M.D.   On: 03/23/2024 11:39    ASSESSMENT AND PLAN: This is a very pleasant 73 years old never smoker African-American female recently with a stage IV non-small cell lung cancer, adenocarcinoma with positive EGFR mutation with deletion in exon 19. The patient was started on treatment with Tagrisso  80 mg p.o. daily status post 35 months of treatment. She has been tolerating this treatment well with no concerning adverse effects except for intermittent diarrhea. She had repeat CT scan of the chest, abdomen pelvis performed recently.  I personally and independently reviewed the scans and discussed the results with the patient and her boyfriend today. Unfortunately the CT scan showed interval progression of the right apical lung mass in addition to progression of mediastinal lymphadenopathy concerning for worsening of her disease. She has no actionable resistant mutation on the molecular studies performed by [facility]. The patient continued her current treatment with Tagrisso  and tolerating it fairly well. 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 Shannon completed January 05, 2021. The patient had significant opacities in her lung that was initially thought to be secondary to radiation treatment versus Tagrisso  induced pneumonitis versus lymphangitic spread of the tumor.  She was treated with high-dose taper regiment of prednisone  Repeat imaging studies after the palliative radiotherapy showed  evidence for disease progression. Her molecular studies by [facility] recently showed no new resistant mutation.  After discussion of her treatment options including palliative care and hospice referral versus palliative systemic chemotherapy the patient was interested in proceeding palliative systemic chemotherapy.  She started  palliative systemic chemotherapy with carboplatin  for AUC of 5 and Alimta  500 Mg/M2 every 3 weeks.  Status post 26 cycles.  Starting from cycle #7 the patient is on treatment with single agent Alimta  every 3 weeks. I did not add a Avastin to her treatment because of the recent stroke.  She also continued her treatment with Tagrisso  at the same time.  Her treatment with Alimta  was discontinued more than 12 months ago secondary to intolerance and fatigue. She has been tolerating her treatment with Tagrisso  fairly well. She resumed her treatment with maintenance Alimta  in addition to Tagrisso  again on September 11, 2023 when she had evidence for disease progression at that time. She is status post 10 cycles of treatment.   She has been tolerating her treatment well except for the night sweats recently. Assessment and Plan Assessment & Plan Metastatic non-small cell lung cancer on maintenance therapy Currently on maintenance therapy with Alimta  and Tagrisso . Disease progression noted in September 2022, with systemic chemotherapy initiated. Recent scan in September showed stable disease. - Continue maintenance therapy with Alimta  and Tagrisso  - Scheduled next appointment in three weeks without a scan  Worsening night sweats Reports worsening night sweats, requiring frequent changes of bed linens. Sweating has been ongoing and is getting worse. No fever or chills reported. Possible differential includes hypothyroidism or other systemic causes. - Checked TSH to evaluate for hypothyroidism - Advised to discuss with primary care physician or gynecologist regarding Carlene and its  potential impact on symptoms  Fatigue Reports significant fatigue, particularly yesterday, with difficulty lifting her head and walking. No anemia or abnormal white blood  count noted on recent CBC. - Continue to monitor symptoms and evaluate for potential causes at follow-up  Peripheral edema (mild ankle swelling) Mild ankle swelling noted, possibly related to weight gain. - Advised to reduce salt intake and elevate legs at the end of the day  Possible hyperthyroidism (under evaluation) Night sweats and fatigue could be indicative of hypothyroidism. TSH will be checked to rule out this possibility. - Checked TSH to evaluate for hypothyroidism She was advised to call immediately if she has any other concerning symptoms in the interval.  The patient voices understanding of current disease status and treatment options and is in agreement with the current care plan.  All questions were answered. The patient knows to call the clinic with any problems, questions or concerns. We can certainly see the patient much sooner if necessary.  he total time spent in the appointment was 30 minutes.  Disclaimer: This note was dictated with voice recognition software. Similar sounding words can inadvertently be transcribed and may not be corrected upon review.

## 2024-04-22 NOTE — Patient Instructions (Signed)
 [facility] - A DEPT OF [facility]  Discharge Instructions: Thank you for choosing [facility] to provide your oncology and hematology care.   If you have a lab appointment with the Cancer Center, please go directly to the Cancer Center and check in at the registration area.   Wear comfortable clothing and clothing appropriate for easy access to any Portacath or PICC line.   We strive to give you quality time with your provider. You may need to reschedule your appointment if you arrive late (15 or more minutes).  Arriving late affects you and other patients whose appointments are after yours.  Also, if you miss three or more appointments without notifying the office, you may be dismissed from the clinic at the provider's discretion.      For prescription refill requests, have your pharmacy contact our office and allow 72 hours for refills to be completed.    Today you received the following chemotherapy and/or immunotherapy agents :  Pemetrexed .       To help prevent nausea and vomiting after your treatment, we encourage you to take your nausea medication as directed.  BELOW ARE SYMPTOMS THAT SHOULD BE REPORTED IMMEDIATELY: *FEVER GREATER THAN 100.4 F (38 C) OR HIGHER *CHILLS OR SWEATING *NAUSEA AND VOMITING THAT IS NOT CONTROLLED WITH YOUR NAUSEA MEDICATION *UNUSUAL SHORTNESS OF BREATH *UNUSUAL BRUISING OR BLEEDING *URINARY PROBLEMS (pain or burning when urinating, or frequent urination) *BOWEL PROBLEMS (unusual diarrhea, constipation, pain near the anus) TENDERNESS IN MOUTH AND THROAT WITH OR WITHOUT PRESENCE OF ULCERS (sore throat, sores in mouth, or a toothache) UNUSUAL RASH, SWELLING OR PAIN  UNUSUAL VAGINAL DISCHARGE OR ITCHING   Items with * indicate a potential emergency and should be followed up as soon as possible or go to the Emergency Department if any problems should occur.  Please show the CHEMOTHERAPY ALERT CARD or  IMMUNOTHERAPY ALERT CARD at check-in to the Emergency Department and triage nurse.  Should you have questions after your visit or need to cancel or reschedule your appointment, please contact [facility] - A DEPT OF [facility]  Dept: [phone]  and follow the prompts.  Office hours are 8:00 a.m. to 4:30 p.m. Monday - Friday. Please note that voicemails left after 4:00 p.m. may not be returned until the following business day.  We are closed weekends and major holidays. You have access to a nurse at all times for urgent questions. Please call the main number to the clinic Dept: [phone] and follow the prompts.   For any non-urgent questions, you may also contact your provider using MyChart. We now offer e-Visits for anyone 73 and older to request care online for non-urgent symptoms. For details visit [email or web address].   Also download the MyChart app! Go to the app store, search "MyChart", open the app, select [facility], and log in with your MyChart username and password.

## 2024-04-22 NOTE — Progress Notes (Signed)
 Labs reviewed with MD today. Ok to treat per MD.   TSH drawn post chemo infusion. Treatment given per orders. Patient tolerated it well without problems. Vitals stable and discharged home from clinic ambulatory. Follow up as scheduled.

## 2024-04-24 ENCOUNTER — Other Ambulatory Visit (HOSPITAL_COMMUNITY): Payer: Self-pay

## 2024-04-24 ENCOUNTER — Encounter: Payer: Self-pay | Admitting: Physician Assistant

## 2024-04-24 ENCOUNTER — Encounter: Payer: Self-pay | Admitting: Internal Medicine

## 2024-04-24 ENCOUNTER — Ambulatory Visit (HOSPITAL_BASED_OUTPATIENT_CLINIC_OR_DEPARTMENT_OTHER): Admitting: Cardiology

## 2024-04-24 ENCOUNTER — Encounter (HOSPITAL_BASED_OUTPATIENT_CLINIC_OR_DEPARTMENT_OTHER): Payer: Self-pay | Admitting: Cardiology

## 2024-04-24 VITALS — BP 138/64 | HR 65 | Ht 64.0 in | Wt 137.7 lb

## 2024-04-24 DIAGNOSIS — R600 Localized edema: Secondary | ICD-10-CM | POA: Diagnosis not present

## 2024-04-24 DIAGNOSIS — I10 Essential (primary) hypertension: Secondary | ICD-10-CM | POA: Diagnosis not present

## 2024-04-24 DIAGNOSIS — I2510 Atherosclerotic heart disease of native coronary artery without angina pectoris: Secondary | ICD-10-CM | POA: Diagnosis not present

## 2024-04-24 DIAGNOSIS — I639 Cerebral infarction, unspecified: Secondary | ICD-10-CM | POA: Diagnosis not present

## 2024-04-24 DIAGNOSIS — E785 Hyperlipidemia, unspecified: Secondary | ICD-10-CM

## 2024-04-24 NOTE — Patient Instructions (Signed)
 Medication Instructions:   Your physician recommends that you continue on your current medications as directed. Please refer to the Current Medication list given to you today.  *If you need a refill on your cardiac medications before your next appointment, please call your pharmacy*    Follow-Up: At [facility], you and your health needs are our priority.  As part of our continuing mission to provide you with exceptional heart care, our providers are all part of one team.  This team includes your primary Cardiologist (physician) and Advanced Practice Providers or APPs (Physician Assistants and Nurse Practitioners) who all work together to provide you with the care you need, when you need it.  Your next appointment:   6 month(s)  Provider:   Lonni LITTIE Nanas, MD

## 2024-04-24 NOTE — Progress Notes (Signed)
 Cardiology Office Note:    Date:  04/24/2024   ID:  Allison [name], DOB [birth year], MRN [id number]  PCP:  [name], MD  Cardiologist:  Lonni LITTIE Nanas, MD  Electrophysiologist:  None   Referring MD: [name], MD   Chief Complaint  Patient presents with   Coronary Artery Disease    History of Present Illness:    Allison [name] is a 73 y.o. female with a hx of CAD status post DES to RCA 06/16/19, stage IV lung cancer, hypertension, hyperlipidemia, DVT/PE, CVA who presents for follow-up.  She presented to ED on 06/12/2019 with chest pain, found to have NSTEMI with peak high-sensitivity troponin 9133.  TTE on 06/13/2019 showed LVEF 55 to 60%, mild LVH, low normal RV systolic function, moderate MR. Cath showed 95% proximal RCA lesion, drug-eluting stent was successfully placed.  Zio patch x13 days on 01/07/2020 showed 3 episodes of NSVT longest lasting 7 beats, 17 episodes of SVT, longest lasting 15 seconds.  At clinic visit in September 2021, she reported chest pains.  Lexiscan  Myoview  on 03/19/2020 showed normal perfusion, EF 70%.  She was admitted to [facility] 02/08/2021 with acute DVT/PE.  Echocardiogram 02/08/2021 showed normal biventricular function, no significant valvular disease.  She was discharged on Eliquis  02/10/2021.  Subsequently was admitted back to [facility] on 02/11/2021 with acute CVA.  This was thought to be embolic in setting of hypercoagulable state due to stage IV lung cancer.  Oncology recommended switching from Eliquis  to Lovenox  for anticoagulation.  Echocardiogram 02/13/2021 showed normal LV systolic function, mild to moderate MR. Zio patch x14 days on 07/21/2021 showed no significant arrhythmias.  Echocardiogram 10/2023 showed EF 55 to 60%, normal RV function, mild to moderate mitral regurgitation, moderate tricuspid regurgitation.  Since last clinic visit, she reports she is doing okay.  Reports no recent chest pain.  Does report some dyspnea with exertion.  Reports  some lightheadedness but denies any syncope.  Has been having some lower extremity edema.  Reports intermittent palpitations.  Wt Readings from Last 3 Encounters:  04/24/24 137 lb 11.2 oz (62.5 kg)  04/22/24 133 lb (60.3 kg)  03/24/24 134 lb (60.8 kg)      Past Medical History:  Diagnosis Date   Anemia    Anxiety    Arthritis    Asthma    exercise induced   Coronary artery disease    Depression    PMH   Dyspnea    GERD (gastroesophageal reflux disease)    Glaucoma    History of radiation therapy 01/05/2021   IMRT right lung  11/24/2020-01/05/2021  Dr Lynwood Nasuti   Hypertension    lung ca dx'd 11/2017   right   Lung cancer [facility]) June 2019   Malignant pleural effusion (HCC)    right   Nuclear sclerotic cataract of right eye 02/28/2021   Dr. Lonni Gaudy, cataract surgery February 2023   PONV (postoperative nausea and vomiting)    Pre-diabetes    Raynaud's disease    Raynaud's disease    Stroke (HCC) 01/2021   balance off, some express aphasia, weakness    Past Surgical History:  Procedure Laterality Date   ABDOMINAL HYSTERECTOMY     partial   BRONCHIAL BIOPSY  04/21/2021   Procedure: BRONCHIAL BIOPSIES;  Surgeon: Brenna Adine LITTIE, DO;  Location: [facility];  Service: Pulmonary;;   BRONCHIAL BRUSHINGS  04/21/2021   Procedure: BRONCHIAL BRUSHINGS;  Surgeon: Brenna Adine LITTIE, DO;  Location: [facility];  Service: Pulmonary;;  BRONCHIAL NEEDLE ASPIRATION BIOPSY  04/21/2021   Procedure: BRONCHIAL NEEDLE ASPIRATION BIOPSIES;  Surgeon: Brenna Adine CROME, DO;  Location: [facility];  Service: Pulmonary;;   CHEST TUBE INSERTION Right 01/01/2018   Procedure: INSERTION PLEURAL DRAINAGE CATHETER;  Surgeon: Fleeta Hanford Coy, MD;  Location: [facility] OR;  Service: Thoracic;  Laterality: Right;   CHEST TUBE INSERTION  04/21/2021   Procedure: CHEST TUBE INSERTION;  Surgeon: Brenna Adine CROME, DO;  Location: [facility];  Service: Pulmonary;;   COLONOSCOPY     CORONARY STENT  INTERVENTION N/A 06/16/2019   Procedure: CORONARY STENT INTERVENTION;  Surgeon: Dann Candyce RAMAN, MD;  Location: [facility] INVASIVE CV LAB;  Service: Cardiovascular;  Laterality: N/A;   DILATION AND CURETTAGE OF UTERUS     EYE SURGERY     due to Glaucoma   IR IMAGING GUIDED PORT INSERTION  03/22/2021   IR PORT REPAIR CENTRAL VENOUS ACCESS DEVICE  04/15/2021   LEFT HEART CATH AND CORONARY ANGIOGRAPHY N/A 06/16/2019   Procedure: LEFT HEART CATH AND CORONARY ANGIOGRAPHY;  Surgeon: Dann Candyce RAMAN, MD;  Location: [facility] INVASIVE CV LAB;  Service: Cardiovascular;  Laterality: N/A;   REMOVAL OF PLEURAL DRAINAGE CATHETER Right 11/07/2018   Procedure: REMOVAL OF PLEURAL DRAINAGE CATHETER;  Surgeon: Fleeta Hanford Coy, MD;  Location: [facility] OR;  Service: Thoracic;  Laterality: Right;   ROTATOR CUFF REPAIR     TUBAL LIGATION     VIDEO BRONCHOSCOPY WITH ENDOBRONCHIAL NAVIGATION Bilateral 04/21/2021   Procedure: VIDEO BRONCHOSCOPY WITH ENDOBRONCHIAL NAVIGATION;  Surgeon: Brenna Adine CROME, DO;  Location: [facility];  Service: Pulmonary;  Laterality: Bilateral;  ION   WISDOM TOOTH EXTRACTION      Current Medications: Current Meds  Medication Sig   ALPRAZolam  (XANAX ) 0.5 MG tablet Take 1 tablet (0.5 mg total) by mouth 3 (three) times daily as needed.   amLODipine  (NORVASC ) 5 MG tablet Take 1 tablet (5 mg total) by mouth daily.   carvedilol  (COREG ) 12.5 MG tablet Take 1 tablet (12.5 mg total) by mouth 2 (two) times daily.   cycloSPORINE (RESTASIS) 0.05 % ophthalmic emulsion Place 1 drop into both eyes 2 (two) times daily.   dexamethasone  (DECADRON ) 4 MG tablet Take 1 tablet twice a day the day before, day of, and day after treatment (Patient taking differently: Take 4 mg by mouth 2 (two) times daily. Takes day before, day of and day after chemo every 3 weeks)   dorzolamide -timolol  (COSOPT ) 22.3-6.8 MG/ML ophthalmic solution Place 1 drop into both eyes 2 (two) times daily.   enoxaparin  (LOVENOX ) 60 MG/0.6ML  injection Inject 0.55 mLs (55 mg total) into the skin every 12 (twelve) hours. (Patient taking differently: Inject 80 mg into the skin daily.)   estradiol (ESTRACE) 0.1 MG/GM vaginal cream Place a pea sized amount to upper vulvar are and just inside vagina 2-3 (two to three) times a week.   ezetimibe  (ZETIA ) 10 MG tablet Take 10 mg by mouth daily.   famotidine  (PEPCID ) 20 MG tablet TAKE 1 TABLET BY MOUTH TWICE A DAY (Patient taking differently: Take 20 mg by mouth daily.)   FeFum-FePoly-FA-B Cmp-C-Biot (FOLIVANE-PLUS) CAPS Take 1 capsule by mouth in the morning. (Patient taking differently: Take 1 capsule by mouth every other day.)   fentaNYL  (DURAGESIC ) 12 MCG/HR Place 1 patch onto the skin every 3 (three) days.   FLUoxetine  (PROZAC ) 20 MG capsule Take 1 capsule (20 mg total) by mouth daily.   folic acid  (FOLVITE ) 1 MG tablet TAKE 1 TABLET BY MOUTH EVERY DAY  Glycopyrrolate -Formoterol  (BEVESPI  AEROSPHERE) 9-4.8 MCG/ACT AERO Inhale 2 puffs into the lungs 2 (two) times daily.   HYDROcodone  bit-homatropine (HYCODAN) 5-1.5 MG/5ML syrup Take 5 mLs by mouth every 6 (six) hours as needed for cough.   hyoscyamine  (LEVSIN  SL) 0.125 MG SL tablet Place 1-2 tablets (0.125-0.25mg ) under the tongue every 6 (six) hours as needed for cramping.   isosorbide  mononitrate (IMDUR ) 30 MG 24 hr tablet Take 1 tablet (30 mg total) by mouth daily.   latanoprost  (XALATAN ) 0.005 % ophthalmic solution Place 1 drop into both eyes at bedtime.    lidocaine  (LIDODERM ) 5 % Place 1 patch onto the skin daily. Remove & Discard patch within 12 hours or as directed by MD   lidocaine -prilocaine  (EMLA ) cream Apply 1 Application topically as needed.   nitroGLYCERIN  (NITROSTAT ) 0.4 MG SL tablet Place 1 tablet (0.4 mg total) under the tongue every 5 (five) minutes as needed for chest pain.   ondansetron  (ZOFRAN -ODT) 4 MG disintegrating tablet Take 1 tablet (4 mg total) by mouth every 8 (eight) hours as needed for nausea or vomiting.    osimertinib  mesylate (TAGRISSO ) 80 MG tablet Take 1 tablet (80 mg total) by mouth daily.   oxyCODONE  (OXY IR/ROXICODONE ) 5 MG immediate release tablet Take 1-2 tablets (5-10 mg total) by mouth every 4 (four) hours as needed for severe pain (pain score 7-10) or breakthrough pain.   predniSONE  (DELTASONE ) 10 MG tablet Take 1 tablet (10 mg total) by mouth daily with breakfast.   prochlorperazine  (COMPAZINE ) 10 MG tablet Take 1 tablet (10 mg total) by mouth every 6 (six) hours as needed.   REPATHA  SURECLICK 140 MG/ML SOAJ Inject 140 mg into the skin every 14 (fourteen) days.   senna-docusate (SENNA S) 8.6-50 MG tablet Take 2 tablets by mouth at bedtime.   triamcinolone  cream (KENALOG ) 0.1 % Apply a thin layer topically to the affected area 2 (two) times daily.   Wheat Dextrin (BENEFIBER DRINK MIX) PACK Take 1 packet by mouth daily. (Patient taking differently: Take 1 packet by mouth daily as needed (fiber).)     Allergies:   Hydrocodone -acetaminophen , Other, Penicillins, Lactose, Vicodin [hydrocodone -acetaminophen ], Bacid, and Ipratropium-albuterol    Social History   Socioeconomic History   Marital status: Widowed    Spouse name: Not on file   Number of children: 3   Years of education: Not on file   Highest education level: Not on file  Occupational History    Employer: [employer]  Tobacco Use   Smoking status: Never    Passive exposure: Past   Smokeless tobacco: Never  Vaping Use   Vaping status: Never Used  Substance and Sexual Activity   Alcohol use: Not Currently    Comment: up to 3 drinks per week   Drug use: No    Comment: CBD oil    Sexual activity: Not Currently    Comment: Hysterectomy  Other Topics Concern   Not on file  Social History Narrative   Not on file   Social Drivers of Health   Financial Resource Strain: Low Risk  (07/22/2019)   Overall Financial Resource Strain (CARDIA)    Difficulty of Paying Living Expenses: Not hard at all  Food Insecurity: No Food  Insecurity (04/24/2024)   Hunger Vital Sign    Worried About Running Out of Food in the Last Year: Never true    Ran Out of Food in the Last Year: Never true  Transportation Needs: No Transportation Needs (09/07/2023)   PRAPARE - Transportation  Lack of Transportation (Medical): No    Lack of Transportation (Non-Medical): No  Physical Activity: Sufficiently Active (07/22/2019)   Exercise Vital Sign    Days of Exercise per Week: 5 days    Minutes of Exercise per Session: 30 min  Stress: Not on file  Social Connections: Socially Integrated (09/07/2023)   Social Connection and Isolation Panel    Frequency of Communication with Friends and Family: Three times a week    Frequency of Social Gatherings with Friends and Family: Once a week    Attends Religious Services: More than 4 times per year    Active Member of [employer] or Organizations: Yes    Attends Engineer, Structural: More than 4 times per year    Marital Status: Living with partner     Family History: The patient's family history includes Heart disease in her brother, brother, and sister; Lung cancer in an other family member.  ROS:   Please see the history of present illness.     All other systems reviewed and are negative.  EKGs/Labs/Other Studies Reviewed:    The following studies were reviewed today:   EKG:   06/21/21: Normal sinus rhythm, rate 70, no ST abnormality 03/23/22: Normal sinus rhythm, rate 72, no ST abnormalities 09/26/2023: Normal sinus rhythm, rate 82, no ST abnormalities 02/21/24: NSR, rate 68, no ST abnormalities  Cath 06/16/19: Prox LAD lesion is 25% stenosed. Prox RCA lesion is 95% stenosed. A drug-eluting stent was successfully placed using a SYNERGY XD 2.25X28, postdilated to 2.5 mm. Post intervention, there is a 0% residual stenosis. The left ventricular systolic function is normal. LV end diastolic pressure is normal. The left ventricular ejection fraction is 55-65% by visual estimate. There  is no aortic valve stenosis.   Continue dual antiplatelet therapy.  Would check with Pharmacy about interactions of Plavix  with Tagrisso .  If there is an interaction, would switch to Brilinta.        TTE 06/13/19:  1. Left ventricular ejection fraction, by visual estimation, is 55 to  60%. The left ventricle has normal function. There is mildly increased  left ventricular hypertrophy.   2. The left ventricle has no regional wall motion abnormalities.   3. Global right ventricle has low normal systolic function.The right  ventricular size is normal. No increase in right ventricular wall  thickness.   4. Left atrial size was normal.   5. Right atrial size was normal.   6. Mild mitral annular calcification.   7. The mitral valve is degenerative. Moderate mitral valve regurgitation.   8. The tricuspid valve is grossly normal.   9. The aortic valve is tricuspid. Aortic valve regurgitation is not  visualized. No evidence of aortic valve sclerosis or stenosis.  10. The pulmonic valve was grossly normal. Pulmonic valve regurgitation is  not visualized.  11. Normal pulmonary artery systolic pressure.  12. The inferior vena cava is normal in size with greater than 50%  respiratory variability, suggesting right atrial pressure of 3 mmHg.   Carotid duplex 08/01/19: Right Carotid: There is no evidence of stenosis in the right ICA. The                 extracranial vessels were near-normal with only minimal  wall                 thickening or plaque.   Left Carotid: Velocities in the left ICA are consistent with a 1-39%  stenosis.  Non-hemodynamically significant plaque <50% noted in the  CCA.   Vertebrals:  Bilateral vertebral arteries demonstrate antegrade flow.  Subclavians: Normal flow hemodynamics were seen in bilateral subclavian               arteries.   Recent Labs: 04/22/2024: ALT 27; BUN 18; Creatinine 1.38; Hemoglobin 12.4; Platelet Count 168; Potassium 4.0; Sodium  140; TSH 0.300  Recent Lipid Panel    Component Value Date/Time   CHOL 149 10/02/2023 0000   TRIG 60 10/02/2023 0000   HDL 60 10/02/2023 0000   CHOLHDL 2.5 10/02/2023 0000   CHOLHDL 2.8 02/12/2021 0559   VLDL 22 02/12/2021 0559   LDLCALC 77 10/02/2023 0000    Physical Exam:    VS:  BP 138/64 (BP Location: Right Arm, Patient Position: Sitting, Cuff Size: Normal)   Pulse 65   Ht 5' 4 (1.626 m)   Wt 137 lb 11.2 oz (62.5 kg)   LMP  (LMP Unknown)   SpO2 99%   BMI 23.64 kg/m     Wt Readings from Last 3 Encounters:  04/24/24 137 lb 11.2 oz (62.5 kg)  04/22/24 133 lb (60.3 kg)  03/24/24 134 lb (60.8 kg)     GEN:  Well nourished, well developed in no acute distress HEENT: Normal NECK: No JVD CARDIAC: RRR, no murmurs, rubs, gallops RESPIRATORY:  Clear to auscultation without rales, wheezing or rhonchi  ABDOMEN: Soft, non-tender, non-distended MUSCULOSKELETAL: Trace edema SKIN: Warm and dry NEUROLOGIC:  Alert and oriented x 3 PSYCHIATRIC:  Normal affect   ASSESSMENT:    1. CAD in native artery   2. Leg edema   3. Cerebrovascular accident (CVA), unspecified mechanism (HCC)   4. Essential hypertension   5. Hyperlipidemia, unspecified hyperlipidemia type       PLAN:    CAD: Status post drug-eluting stent to RCA on 06/16/2019.  At clinic visit in September 2021, she reported chest pains.  Lexiscan  Myoview  on 03/19/2020 showed normal perfusion, EF 70%.  She reported exertional chest pain and repeat Myoview  03/29/2021 showed normal perfusion, EF 68%. -She stopped taking aspirin  due to GI symptoms.  Okay to discontinue aspirin  given she is on therapeutic Lovenox  -Continue carvedilol  6.25 mg twice daily and Imdur  30 mg daily -Has had issues starting statins due to transaminitis and myalgias.  Referred to lipid clinic and started on Praluent  in September 2021.  Now on Repatha  - Reported chest pain at prior clinic visit 02/2024. Prescribed as needed sublingual nitroglycerin  and  discussed that if having chest pain longer than 5 minutes she will take 1 nitroglycerin , if not relieved can take another nitroglycerin  after 5 minutes, but if not relieved by 2 nitroglycerin  she needs to go to ED. Ordered Lexiscan  Myoview  to evaluate for ischemia but she decided not to have it done.  She reports no recent chest pain, will hold off on ischemic testing at this time  Lower extremity edema: Trace edema in ankles bilaterally.  Otherwise appears euvolemic.  Suspect due to amlodipine  use.  Not causing any symptoms at BP appears controlled, would continue amlodipine  for now.  DVT/PE: admitted to [facility] 02/08/2021 with acute DVT/PE.  She was started on Eliquis , but switched to therapeutic Lovenox  after her CVA 02/10/2021 per oncology  CVA: admitted back to [facility] on 02/11/2021 with acute CVA.  This was thought to be embolic in setting of hypercoagulable state due to stage IV lung cancer.  Oncology recommended switching from Eliquis  to Lovenox  for anticoagulation.  Palpitations: Description concerning for  arrhythmia.  Zio patch x13 days on 01/07/2020 showed 3 episodes of NSVT longest lasting 7 beats, 17 episodes of SVT, longest lasting 15 seconds.  Continue carvedilol .  Carotid bruit: 1 to 39% left carotid artery stenosis, no stenosis in right coronary artery on carotid duplex 08/01/2019.  Hyperlipidemia: LDL 148 on 06/13/2019, improved to LDL 44 08/15/19 while on Lipitor  80 mg daily, but was discontinued due to transaminitis.  She also reported having myalgias with atorvastatin .  Referred to lipid clinic, started on rosuvastatin  5 mg every other day but developed myalgias.  Was reduced to twice weekly but continued to have myalgias so was discontinued.  Started on Praluent  September 2021.  Now on Repatha .  LDL 77 on 09/2023  Hypertension: On carvedilol  12.5 mg twice daily and Imdur  30 mg daily and amlodipine  5mg  daily.  Appears controlled  Stage IV lung cancer: Followed by oncology.  On Alimta .   Following with palliative  Syncope: Description suggest orthostasis.  Zio patch x14 days on 07/21/2021 showed no significant arrhythmias.  RTC in 6 months   Medication Adjustments/Labs and Tests Ordered: Current medicines are reviewed at length with the patient today.  Concerns regarding medicines are outlined above.  No orders of the defined types were placed in this encounter.   No orders of the defined types were placed in this encounter.    Patient Instructions  Medication Instructions:   Your physician recommends that you continue on your current medications as directed. Please refer to the Current Medication list given to you today.  *If you need a refill on your cardiac medications before your next appointment, please call your pharmacy*    Follow-Up: At [facility], you and your health needs are our priority.  As part of our continuing mission to provide you with exceptional heart care, our providers are all part of one team.  This team includes your primary Cardiologist (physician) and Advanced Practice Providers or APPs (Physician Assistants and Nurse Practitioners) who all work together to provide you with the care you need, when you need it.  Your next appointment:   6 month(s)  Provider:   Lonni LITTIE Nanas, MD               Signed, Lonni LITTIE Nanas, MD  04/24/2024 10:56 AM    [facility]

## 2024-04-29 ENCOUNTER — Telehealth: Payer: Self-pay

## 2024-04-29 NOTE — Telephone Encounter (Signed)
 Oral Oncology Patient Advocate Encounter   Received notification that patient is due for re-enrollment for assistance for Tagrisso  through [place].   Re-enrollment process has been initiated and will be submitted upon completion of necessary documents.  I have submitted Patient Authorization form to them. Will wait until 06/19/24 for re-enrollment form.  [employer] phone number [phone].   I will continue to follow until final determination.  Lucie Lamer, CPhT [place]  [facility] Oncology Pharmacy Patient Advocate Specialist [address] [place] Phone: [phone]  Fax: [phone] [email or web address]

## 2024-05-05 ENCOUNTER — Ambulatory Visit: Admitting: Physician Assistant

## 2024-05-05 ENCOUNTER — Ambulatory Visit

## 2024-05-05 ENCOUNTER — Ambulatory Visit: Payer: Self-pay | Admitting: Acute Care

## 2024-05-05 ENCOUNTER — Other Ambulatory Visit

## 2024-05-05 ENCOUNTER — Other Ambulatory Visit (HOSPITAL_COMMUNITY): Payer: Self-pay

## 2024-05-05 MED ORDER — AEROCHAMBER PLUS FLO-VU MISC
0 refills | Status: AC
  Filled 2024-05-05: qty 1, 30d supply, fill #0

## 2024-05-05 NOTE — Telephone Encounter (Signed)
 Called patient and sent in script for spacer.NFN

## 2024-05-05 NOTE — Telephone Encounter (Signed)
 FYI Only or Action Required?: Action required by provider: See if pt can stop by office today to get a new spacer for her inhaler, or if rx can be called in for one to pts pharmacy.  Patient is followed in Pulmonology for Stage 4 lung cancer, last seen on 12/04/2023 by Ruthell Lauraine FALCON, NP.  Called Nurse Triage reporting Shortness of Breath and equipment problem.  Symptoms began yesterday.  Interventions attempted: Prescription medications: Bevespi  inhaler without spacer, pt coughs without it and likely not getting any of the medication.  Symptoms are: stable.  Triage Disposition: Call Specialist Today (overriding Call PCP When Office is Open)  Patient/caregiver understands and will follow disposition?: Yes  Copied from CRM #[id number]. Topic: Clinical - Red Word Triage >> May 05, 2024 10:02 AM Rozanna MATSU wrote: Red Word that prompted transfer to Nurse Triage: pt calin stating her chamber for her inhaler has broken >> May 05, 2024 10:28 AM Rozanna G wrote: Pt request for someone to call her back and let her know what to do. She did not want to continue to hold. Thanks  >> May 05, 2024 10:18 AM Rozanna G wrote: I called the clinic and they do not have any in the clinic, the pt is grasping for air. Stated she was not able to use inhaler last night or today.  >> May 05, 2024 10:12 AM Rozanna MATSU wrote: Stated she was in route to the clinic Reason for Disposition  [1] Caller requesting NON-URGENT health information AND [2] PCP's office is the best resource  Answer Assessment - Initial Assessment Questions 1. REASON FOR CALL: What is the main reason for your call? or How can I best help you?     Pt with hx stage 4 lung cancer states the spacer that she uses with her inhaler broke last night. Takes bevespi  maintenance inhaler, 2 puffs BID. Pt reports she coughs when trying to use inhaler without the spacer and is unsure if she's getting any of the medication. States she contacted her pharmacy  about getting one and they said she would need a prescription for one. Pt states she got her old one from her pulm office. Wanting to know if she can stop by the office to get a new one, or if a new prescription can be sent in. Confirmed pts [facility] on file and call back number [phone] is correct.  2. SYMPTOMS : Do you have any symptoms?      Pt with hx of stage 4 lung cancer. No SOB or change from baseline at this time.  3. OTHER QUESTIONS: Do you have any other questions?     Denies  Protocols used: Information Only Call - No Triage-A-AH

## 2024-05-08 NOTE — Progress Notes (Signed)
 [facility] OFFICE PROGRESS NOTE  [name], MD [address] [name] [address]  DIAGNOSIS: Stage IV (T2 a,N2, M1a) non-small cell lung cancer, adenocarcinoma diagnosed in July 2019 and presented with right upper lobe lung mass in addition to mediastinal lymphadenopathy as well as bilateral pulmonary nodules and malignant right pleural effusion.   Biomarker Findings Microsatellite status - Cannot Be Determined Tumor Mutational Burden - Cannot Be Determined Genomic Findings For a complete list of the genes assayed, please refer to the Appendix. EGFR exon 19 deletion ([id number]) TP53 Y220C 7 Disease relevant genes with no reportable alterations: KRAS, ALK, BRAF, MET, RET, ERBB2, ROS1    Repeat molecular studies in 2025 showed no new mutations  PRIOR THERAPY: 1) Status post right Pleurx catheter placement by Dr. Fleeta Ochoa for drainage of malignant right pleural effusion. 2) palliative radiotherapy to the enlarging right upper lobe lung mass and mediastinum under the care of Dr. Shannon expected to be completed on January 05, 2021. 3) Systemic chemotherapy with carboplatin  for AUC of 5 and Alimta  500 Mg/M2 every 3 weeks.  First dose 03/17/2021.  The patient will also continue her current treatment with Tagrisso  80 mg p.o. daily.  She is status post 26 cycles. Starting from cycle #7, she will be on Alimta  only 400 mg/m2.  Her treatment with Alimta  has been on hold for several months now.   CURRENT THERAPY: Tagrisso  80 mg p.o. daily.  First dose was given on 01/29/2018.  Status post 72.5 months of treatment.   IV chemotherapy with alimta  400 mg/m2 IV every 3 weeks to start around 09/11/23. Status post 11 cycles.   INTERVAL HISTORY: Allison [name] 73 y.o. female returns to the clinic today for a follow-up visit accompanied by her significant other. In summary, the patient was found to have disease progression in early 2025. Therefore, she restarted systemic  chemotherapy with single agent Alimta  dose reduced to 400 mg/m for tolerability.    She is status post 10 cycles of treatment and tolerated it well overall. She takes prednisone  for cough.   She experiences phlegm and mucus causing coughing, which has improved with the use of clove tea and a humidifier. The discomfort in her chest has lessened, and she is coughing less.   She experiences nausea mostly around chemotherapy sessions and takes Compazine  for a couple of days to manage it. She also reports occasional diarrhea lasting about two days, which resolves without the need for Imodium. Her weight is stable at 138 pounds.  She has been using Restasis  eye drops for dry eyes, which has improved her symptoms. Her eye pressure is coming down, and she has not needed to use Mibo drops.  She mentions having night sweats, which are normal for her, and denies any new fevers, infections, chills, or night sweats. Her breathing is reported to be better, and she is not coughing up as much as before.  She has been contacted by pulmonary rehab but has not started due to recent health issues.   She is here today for evaluation repeat blood work before undergoing her next cycle of treatment.   MEDICAL HISTORY: Past Medical History:  Diagnosis Date   Anemia    Anxiety    Arthritis    Asthma    exercise induced   Coronary artery disease    Depression    PMH   Dyspnea    GERD (gastroesophageal reflux disease)    Glaucoma    History of  radiation therapy 01/05/2021   IMRT right lung  11/24/2020-01/05/2021  Dr Lynwood Nasuti   Hypertension    lung ca dx'd 11/2017   right   Lung cancer [facility]) June 2019   Malignant pleural effusion [facility])    right   Nuclear sclerotic cataract of right eye 02/28/2021   Dr. Lonni Gaudy, cataract surgery February 2023   PONV (postoperative nausea and vomiting)    Pre-diabetes    Raynaud's disease    Raynaud's disease    Stroke (HCC) 01/2021   balance off, some  express aphasia, weakness    ALLERGIES:  is allergic to hydrocodone -acetaminophen , other, penicillins, lactose, vicodin [hydrocodone -acetaminophen ], bacid, and ipratropium-albuterol .  MEDICATIONS:  Current Outpatient Medications  Medication Sig Dispense Refill   ALPRAZolam  (XANAX ) 0.25 MG tablet Take 1 tablet (0.25 mg total) by mouth 2 (two) times daily as needed for anxiety. May take 2 tablets (0.5 mg total) at bedtime. (Patient taking differently: Take 0.25-0.5 mg by mouth 2 (two) times daily as needed for anxiety or sleep.) 60 tablet 0   ALPRAZolam  (XANAX ) 0.5 MG tablet Take 1 tablet (0.5 mg total) by mouth 3 (three) times daily as needed. 90 tablet 3   amLODipine  (NORVASC ) 5 MG tablet Take 1 tablet (5 mg total) by mouth daily. 90 tablet 3   carvedilol  (COREG ) 12.5 MG tablet Take 1 tablet (12.5 mg total) by mouth 2 (two) times daily. 180 tablet 3   cycloSPORINE  (RESTASIS ) 0.05 % ophthalmic emulsion Place 1 drop into both eyes 2 (two) times daily. 180 each 3   dexamethasone  (DECADRON ) 4 MG tablet Take 1 tablet twice a day the day before, day of, and day after treatment (Patient taking differently: Take 4 mg by mouth 2 (two) times daily. Takes day before, day of and day after chemo every 3 weeks) 40 tablet 2   dorzolamide -timolol  (COSOPT ) 22.3-6.8 MG/ML ophthalmic solution Place 1 drop into both eyes 2 (two) times daily.     enoxaparin  (LOVENOX ) 60 MG/0.6ML injection Inject 0.55 mLs (55 mg total) into the skin every 12 (twelve) hours. (Patient taking differently: Inject 80 mg into the skin daily.) 100 mL 1   estradiol  (ESTRACE ) 0.1 MG/GM vaginal cream Place a pea sized amount to upper vulvar are and just inside vagina 2-3 (two to three) times a week. 42.5 g 1   ezetimibe  (ZETIA ) 10 MG tablet Take 10 mg by mouth daily.     famotidine  (PEPCID ) 20 MG tablet TAKE 1 TABLET BY MOUTH TWICE A DAY (Patient taking differently: Take 20 mg by mouth daily.) 180 tablet 1   FeFum-FePoly-FA-B Cmp-C-Biot  (FOLIVANE-PLUS) CAPS Take 1 capsule by mouth in the morning. (Patient taking differently: Take 1 capsule by mouth every other day.) 90 capsule 0   fentaNYL  (DURAGESIC ) 12 MCG/HR Place 1 patch onto the skin every 3 (three) days. 10 patch 0   FLUoxetine  (PROZAC ) 20 MG capsule Take 1 capsule (20 mg total) by mouth daily. 90 capsule 3   folic acid  (FOLVITE ) 1 MG tablet TAKE 1 TABLET BY MOUTH EVERY DAY 90 tablet 1   Glycopyrrolate -Formoterol  (BEVESPI  AEROSPHERE) 9-4.8 MCG/ACT AERO Inhale 2 puffs into the lungs 2 (two) times daily. 3 each 3   HYDROcodone  bit-homatropine (HYCODAN) 5-1.5 MG/5ML syrup Take 5 mLs by mouth every 6 (six) hours as needed for cough. 240 mL 0   hyoscyamine  (LEVSIN  SL) 0.125 MG SL tablet Place 1-2 tablets (0.125-0.25mg ) under the tongue every 6 (six) hours as needed for cramping. 30 tablet 3   isosorbide   mononitrate (IMDUR ) 30 MG 24 hr tablet Take 1 tablet (30 mg total) by mouth daily. 90 tablet 3   latanoprost  (XALATAN ) 0.005 % ophthalmic solution Place 1 drop into both eyes at bedtime.      lidocaine  (LIDODERM ) 5 % Place 1 patch onto the skin daily. Remove & Discard patch within 12 hours or as directed by MD 9 patch 0   lidocaine -prilocaine  (EMLA ) cream Apply 1 Application topically as needed. 30 g 0   nitroGLYCERIN  (NITROSTAT ) 0.4 MG SL tablet Place 1 tablet (0.4 mg total) under the tongue every 5 (five) minutes as needed for chest pain. 25 tablet 3   ondansetron  (ZOFRAN -ODT) 4 MG disintegrating tablet Take 1 tablet (4 mg total) by mouth every 8 (eight) hours as needed for nausea or vomiting. 30 tablet 1   osimertinib  mesylate (TAGRISSO ) 80 MG tablet Take 1 tablet (80 mg total) by mouth daily. 30 tablet 4   oxyCODONE  (OXY IR/ROXICODONE ) 5 MG immediate release tablet Take 1-2 tablets (5-10 mg total) by mouth every 4 (four) hours as needed for severe pain (pain score 7-10) or breakthrough pain. 60 tablet 0   predniSONE  (DELTASONE ) 10 MG tablet Take 1 tablet (10 mg total) by mouth  daily with breakfast. 30 tablet 2   prochlorperazine  (COMPAZINE ) 10 MG tablet Take 1 tablet (10 mg total) by mouth every 6 (six) hours as needed. 30 tablet 2   REPATHA  SURECLICK 140 MG/ML SOAJ Inject 140 mg into the skin every 14 (fourteen) days. 6 mL 3   senna-docusate (SENNA S) 8.6-50 MG tablet Take 2 tablets by mouth at bedtime. 60 tablet 3   Spacer/Aero-Holding Chambers (AEROCHAMBER PLUS) Device Needed to be able use inhaler properly for best results 1 each 0   triamcinolone  cream (KENALOG ) 0.1 % Apply a thin layer topically to the affected area 2 (two) times daily. 30 g 3   Wheat Dextrin (BENEFIBER DRINK MIX) PACK Take 1 packet by mouth daily. (Patient taking differently: Take 1 packet by mouth daily as needed (fiber).)     No current facility-administered medications for this visit.    SURGICAL HISTORY:  Past Surgical History:  Procedure Laterality Date   ABDOMINAL HYSTERECTOMY     partial   BRONCHIAL BIOPSY  04/21/2021   Procedure: BRONCHIAL BIOPSIES;  Surgeon: Brenna Adine CROME, DO;  Location: [facility];  Service: Pulmonary;;   BRONCHIAL BRUSHINGS  04/21/2021   Procedure: BRONCHIAL BRUSHINGS;  Surgeon: Brenna Adine CROME, DO;  Location: [facility];  Service: Pulmonary;;   BRONCHIAL NEEDLE ASPIRATION BIOPSY  04/21/2021   Procedure: BRONCHIAL NEEDLE ASPIRATION BIOPSIES;  Surgeon: Brenna Adine CROME, DO;  Location: [facility];  Service: Pulmonary;;   CHEST TUBE INSERTION Right 01/01/2018   Procedure: INSERTION PLEURAL DRAINAGE CATHETER;  Surgeon: Fleeta Hanford Coy, MD;  Location: [facility] OR;  Service: Thoracic;  Laterality: Right;   CHEST TUBE INSERTION  04/21/2021   Procedure: CHEST TUBE INSERTION;  Surgeon: Brenna Adine CROME, DO;  Location: [facility];  Service: Pulmonary;;   COLONOSCOPY     CORONARY STENT INTERVENTION N/A 06/16/2019   Procedure: CORONARY STENT INTERVENTION;  Surgeon: Dann Candyce RAMAN, MD;  Location: [facility] INVASIVE CV LAB;  Service: Cardiovascular;  Laterality: N/A;    DILATION AND CURETTAGE OF UTERUS     EYE SURGERY     due to Glaucoma   IR IMAGING GUIDED PORT INSERTION  03/22/2021   IR PORT REPAIR CENTRAL VENOUS ACCESS DEVICE  04/15/2021   LEFT HEART CATH AND CORONARY ANGIOGRAPHY N/A 06/16/2019  Procedure: LEFT HEART CATH AND CORONARY ANGIOGRAPHY;  Surgeon: Dann Candyce RAMAN, MD;  Location: [facility] INVASIVE CV LAB;  Service: Cardiovascular;  Laterality: N/A;   REMOVAL OF PLEURAL DRAINAGE CATHETER Right 11/07/2018   Procedure: REMOVAL OF PLEURAL DRAINAGE CATHETER;  Surgeon: Fleeta Hanford Coy, MD;  Location: [facility] OR;  Service: Thoracic;  Laterality: Right;   ROTATOR CUFF REPAIR     TUBAL LIGATION     VIDEO BRONCHOSCOPY WITH ENDOBRONCHIAL NAVIGATION Bilateral 04/21/2021   Procedure: VIDEO BRONCHOSCOPY WITH ENDOBRONCHIAL NAVIGATION;  Surgeon: Brenna Adine CROME, DO;  Location: [facility];  Service: Pulmonary;  Laterality: Bilateral;  ION   WISDOM TOOTH EXTRACTION      REVIEW OF SYSTEMS:   Review of Systems  Constitutional: Positive for fatigue. Negative for appetite change, chills, fever and unexpected weight change.  HENT: Negative for mouth sores, nosebleeds, sore throat and trouble swallowing.   Eyes: Negative for eye problems and icterus.  Respiratory: Positive for significantly improved cough and stable shortness of breath with exertion. Negative for hemoptysis and wheezing.   Cardiovascular:  Chronic but improved chest tightness. Negative for chest pain or leg swelling.  Gastrointestinal: Occasional mild diarrhea. Negative for abdominal pain, nauseam and vomiting.  Genitourinary: Negative for bladder incontinence, difficulty urinating, dysuria, frequency and hematuria.   Musculoskeletal:  Negative for gait problem, neck pain and neck stiffness.  Skin: Mild paronychia around the nails.  Negative for itching and rash.  Neurological: Negative for dizziness, extremity weakness, gait problem, headaches, light-headedness and seizures.  Hematological: Negative  for adenopathy. Does not bruise/bleed easily.  Psychiatric/Behavioral: Negative for confusion, depression and sleep disturbance. The patient is not nervous/anxious.   PHYSICAL EXAMINATION:  Pulse 73, temperature (!) 97.3 F (36.3 C), temperature source Temporal, resp. rate (!) 69, height 5' 4 (1.626 m), weight 138 lb (62.6 kg), SpO2 100%.  ECOG PERFORMANCE STATUS: 1  Physical Exam  Constitutional: Oriented to person, place, and time and thin appearing female and in no distress.   HENT:  Head: Normocephalic and atraumatic.  Mouth/Throat: Oropharynx is clear and moist. No oropharyngeal exudate.  Eyes: Conjunctivae are normal. Right eye exhibits no discharge. Left eye exhibits no discharge. No scleral icterus.  Neck: Normal range of motion. Neck supple.  Cardiovascular: Normal rate, regular rhythm, normal heart sounds and intact distal pulses.   Pulmonary/Chest: Effort normal and breath sounds clear to auscultation. No respiratory distress. No wheezes. No rales.  Abdominal: Soft. Bowel sounds are normal. Exhibits no distension and no mass. There is no tenderness.  Musculoskeletal: Normal range of motion. Exhibits no edema.  Lymphadenopathy:    No cervical adenopathy.  Neurological: Alert and oriented to person, place, and time. Exhibits muscle wasting. Gait normal. Coordination normal. Uses a cane for ambulation.  Skin: Skin is warm and dry. No rash noted. Not diaphoretic. No erythema. No pallor.  Psychiatric: Mood, memory and judgment normal.  Vitals reviewed.    LABORATORY DATA: Lab Results  Component Value Date   WBC 7.0 05/13/2024   HGB 11.8 (L) 05/13/2024   HCT 36.6 05/13/2024   MCV 88.4 05/13/2024   PLT 201 05/13/2024      Chemistry      Component Value Date/Time   NA 140 05/13/2024 0754   NA 140 03/04/2021 1516   K 4.2 05/13/2024 0754   CL 105 05/13/2024 0754   CO2 25 05/13/2024 0754   BUN 19 05/13/2024 0754   BUN 21 03/04/2021 1516   CREATININE 1.18 (H)  05/13/2024 0754  Component Value Date/Time   CALCIUM  9.6 05/13/2024 0754   ALKPHOS 70 05/13/2024 0754   AST 38 05/13/2024 0754   ALT 41 05/13/2024 0754   BILITOT 0.2 05/13/2024 0754       RADIOGRAPHIC STUDIES:  No results found.   ASSESSMENT/PLAN:  This is a very pleasant 73 year old never smoker African-American female diagnosed with stage IV non-small cell lung cancer, adenocarcinoma.  She was positive for an EGFR mutation with deletion in exon 19.  She was diagnosed in July 2019 and presented with right upper lobe lung mass in addition to mediastinal lymphadenopathy as well as bilateral pulmonary nodules and malignant right pleural effusion.   The patient was started on treatment with Tagrisso  80 mg p.o. daily status post 39 months of treatment. Started on 01/29/2018.   In April 2022, she showed evidence of disease progression with interval progression of the right apical lung mass in addition to progression of mediastinal lymphadenopathy concerning for worsening of her disease. She had repeat Guardant 360 molecular studies which did not show evidence of new resistant mutations.  Therefore, she was referred to radiation oncology and completed palliative radiotherapy to the enlarging right upper lobe lung mass and mediastinum under the care of Dr. Shannon.  This was completed in July 2022.    The patient underwent a repeat bronchoscopy and biopsy for repeat molecular testing.  The sample from the left and right lung biopsy was negative for malignancy. This was performed in November 2022.    Unfortunately, the patient  was found to have evidence of disease progression.  Therefore, Dr. Sherrod started the patient on systemic chemotherapy with carboplatin  for AUC of 5 and Alimta  500 mg per metered squared.  She is status post 23 cycles and she tolerated it fairly well despite her ongoing issues with fatigue, chest tightness, cough, and weight loss.  Alimta  was reduced to 400 mg per metered  squared due to renal insufficiency.  She is not a good candidate for Avastin due to her recent CVA in August 2022.  She also is continuing to take Tagrisso  as it is protective against progressive metastatic disease to the brain. Starting from cycle #7,  she started maintenance single agent alimta . Her treatment has been on hold since March 2024 due to side effects of treatment.    Of note, the patient saw Dr. Valentino from [facility] for second opinion in November 2022.  They discussed if she progresses on chemotherapy that there may be upcoming options. He discussed other treatment options which may be available in 2023 including patritumab deruxtcan and lazertinib/amivantmab.    Her most recent restaging CT scan showed disease progression with new and increasing septal thickening and perilymphatic nodularity throughout the right hemithorax suspicious for lymphangitic spread of tumor.  A PET scan to confirm this.   Therefore, at her appointment on 08/23/2023 we discussed the options.  She had repeat molecular studies performed which did not show any new actionable mutations.  Therefore the plan will be to proceed with single agent Alimta  IV every 3 weeks as scheduled.  The alternative options that we discussed was adding carboplatin  to the Alimta .  We also talked about amivantamab and lazertinib, but the patient and Dr. Sherrod have concerns about quality of life and side effect profile, although this likely would be effective   The patient opted to proceed on single agent chemotherapy with Alimta  400 mg/m.  She started this on 09/11/2023.  She tolerated this fairly well. She is status  post 11 cycles of treatment.    She looks much improved since she starting treatment.    Labs were reviewed.  Recommend that she proceed with cycle 12 today as scheduled.    We will see her back for follow-up visit in 3 weeks for evaluation and to review blood work before undergoing cycle #13.   She will  continue prednisone  10 mg for now.   She will contact pulmonary medicine about initiating pulmonary rehab.    She will continue lovenox  for her history of recurrent DVTs.  He is currently taking this once a day.   I will arrange for a restaging CT scan of the CAP prior to her next cycle of treatment.   She has a graduation to attend in December and would like to push her next infusion back to 06/09/24.   Clove tea discontinued due to potential interactions with osimertinib  and steroids.  Chemotherapy-induced nausea and diarrhea Intermittent nausea and diarrhea associated with chemotherapy, primarily around treatment times. Nausea managed with Compazine , diarrhea resolves within two days without Imodium. - Continue Compazine  for nausea as needed. - Use Imodium if diarrhea persists.   She did not take her BP meds today. She took them here. Her BP is high but she will monitor it at home and contact her PCP with a log of her readings if she is consistently outside of goal.   The patient was advised to call immediately if she has any concerning symptoms in the interval. The patient voices understanding of current disease status and treatment options and is in agreement with the current care plan. All questions were answered. The patient knows to call the clinic with any problems, questions or concerns. We can certainly see the patient much sooner if necessary   Orders Placed This Encounter  Procedures   CT CHEST ABDOMEN PELVIS W CONTRAST    Standing Status:   Future    Expected Date:   05/27/2024    Expiration Date:   05/13/2025    If indicated for the ordered procedure, I authorize the administration of contrast media per Radiology protocol:   Yes    Does the patient have a contrast media/X-ray dye allergy?:   No    Preferred imaging location?:   [facility]    If indicated for the ordered procedure, I authorize the administration of oral contrast media per Radiology protocol:    Yes     The total time spent in the appointment was 20-29 minutes  Ashleyann Shoun L Borghild Thaker, PA-C 05/13/24

## 2024-05-13 ENCOUNTER — Encounter: Payer: Self-pay | Admitting: Physician Assistant

## 2024-05-13 ENCOUNTER — Encounter: Payer: Self-pay | Admitting: Internal Medicine

## 2024-05-13 ENCOUNTER — Inpatient Hospital Stay: Admitting: Physician Assistant

## 2024-05-13 ENCOUNTER — Inpatient Hospital Stay: Admitting: Nurse Practitioner

## 2024-05-13 ENCOUNTER — Inpatient Hospital Stay

## 2024-05-13 ENCOUNTER — Other Ambulatory Visit (HOSPITAL_COMMUNITY): Payer: Self-pay

## 2024-05-13 ENCOUNTER — Encounter: Payer: Self-pay | Admitting: Nurse Practitioner

## 2024-05-13 ENCOUNTER — Other Ambulatory Visit: Payer: Self-pay

## 2024-05-13 VITALS — HR 73 | Temp 97.3°F | Resp 69 | Ht 64.0 in | Wt 138.0 lb

## 2024-05-13 VITALS — BP 140/67 | HR 76 | Resp 16

## 2024-05-13 DIAGNOSIS — C3490 Malignant neoplasm of unspecified part of unspecified bronchus or lung: Secondary | ICD-10-CM

## 2024-05-13 DIAGNOSIS — R530 Neoplastic (malignant) related fatigue: Secondary | ICD-10-CM

## 2024-05-13 DIAGNOSIS — K5903 Drug induced constipation: Secondary | ICD-10-CM

## 2024-05-13 DIAGNOSIS — Z5111 Encounter for antineoplastic chemotherapy: Secondary | ICD-10-CM

## 2024-05-13 DIAGNOSIS — G893 Neoplasm related pain (acute) (chronic): Secondary | ICD-10-CM

## 2024-05-13 DIAGNOSIS — Z515 Encounter for palliative care: Secondary | ICD-10-CM

## 2024-05-13 DIAGNOSIS — C3491 Malignant neoplasm of unspecified part of right bronchus or lung: Secondary | ICD-10-CM

## 2024-05-13 LAB — CBC WITH DIFFERENTIAL (CANCER CENTER ONLY)
Abs Immature Granulocytes: 0.03 K/uL (ref 0.00–0.07)
Basophils Absolute: 0 K/uL (ref 0.0–0.1)
Basophils Relative: 0 %
Eosinophils Absolute: 0 K/uL (ref 0.0–0.5)
Eosinophils Relative: 0 %
HCT: 36.6 % (ref 36.0–46.0)
Hemoglobin: 11.8 g/dL — ABNORMAL LOW (ref 12.0–15.0)
Immature Granulocytes: 0 %
Lymphocytes Relative: 12 %
Lymphs Abs: 0.8 K/uL (ref 0.7–4.0)
MCH: 28.5 pg (ref 26.0–34.0)
MCHC: 32.2 g/dL (ref 30.0–36.0)
MCV: 88.4 fL (ref 80.0–100.0)
Monocytes Absolute: 0.3 K/uL (ref 0.1–1.0)
Monocytes Relative: 4 %
Neutro Abs: 5.9 K/uL (ref 1.7–7.7)
Neutrophils Relative %: 84 %
Platelet Count: 201 K/uL (ref 150–400)
RBC: 4.14 MIL/uL (ref 3.87–5.11)
RDW: 14.9 % (ref 11.5–15.5)
WBC Count: 7 K/uL (ref 4.0–10.5)
nRBC: 0 % (ref 0.0–0.2)

## 2024-05-13 LAB — CMP (CANCER CENTER ONLY)
ALT: 41 U/L (ref 0–44)
AST: 38 U/L (ref 15–41)
Albumin: 4.1 g/dL (ref 3.5–5.0)
Alkaline Phosphatase: 70 U/L (ref 38–126)
Anion gap: 11 (ref 5–15)
BUN: 19 mg/dL (ref 8–23)
CO2: 25 mmol/L (ref 22–32)
Calcium: 9.6 mg/dL (ref 8.9–10.3)
Chloride: 105 mmol/L (ref 98–111)
Creatinine: 1.18 mg/dL — ABNORMAL HIGH (ref 0.44–1.00)
GFR, Estimated: 49 mL/min — ABNORMAL LOW (ref >60)
Glucose, Bld: 122 mg/dL — ABNORMAL HIGH (ref 70–99)
Potassium: 4.2 mmol/L (ref 3.5–5.1)
Sodium: 140 mmol/L (ref 135–145)
Total Bilirubin: 0.2 mg/dL (ref 0.0–1.2)
Total Protein: 7.1 g/dL (ref 6.5–8.1)

## 2024-05-13 MED ORDER — SODIUM CHLORIDE 0.9 % IV SOLN: INTRAVENOUS | Status: DC

## 2024-05-13 MED ORDER — CYANOCOBALAMIN 1000 MCG/ML IJ SOLN
1000.0000 ug | Freq: Once | INTRAMUSCULAR | Status: AC
  Administered 2024-05-13: 1000 ug via INTRAMUSCULAR
  Filled 2024-05-13: qty 1

## 2024-05-13 MED ORDER — PROCHLORPERAZINE MALEATE 10 MG PO TABS
10.0000 mg | ORAL_TABLET | Freq: Once | ORAL | Status: AC
  Administered 2024-05-13: 10 mg via ORAL
  Filled 2024-05-13: qty 1

## 2024-05-13 MED ORDER — SODIUM CHLORIDE 0.9% FLUSH: 10.0000 mL | INTRAVENOUS | Status: DC | PRN

## 2024-05-13 MED ORDER — FENTANYL 12 MCG/HR TD PT72: 1.0000 | MEDICATED_PATCH | TRANSDERMAL | 0 refills | Status: DC

## 2024-05-13 MED ORDER — SODIUM CHLORIDE 0.9 % IV SOLN
400.0000 mg/m2 | Freq: Once | INTRAVENOUS | Status: AC
  Administered 2024-05-13: 600 mg via INTRAVENOUS
  Filled 2024-05-13: qty 20

## 2024-05-13 MED ORDER — FOLIC ACID 1 MG PO TABS
1.0000 mg | ORAL_TABLET | Freq: Every day | ORAL | 1 refills | Status: AC
  Filled 2024-05-13: qty 90, 90d supply, fill #0

## 2024-05-13 NOTE — Progress Notes (Signed)
 Palliative Medicine [facility]  [phone] [phone]) [phone]   Name: AVERLEE SWARTZ Date: 05/13/2024 MRN: [id number]  DOB: 04/11/1951  Patient Care Team: [name], MD as PCP - General (Internal Medicine) Kate Lonni LITTIE, MD as PCP - Cardiology (Cardiology) Shannon Agent, MD as Consulting Physician (Radiation Oncology) Pickenpack-Cousar, Fannie SAILOR, NP as Nurse Practitioner (Nurse Practitioner) Evertt Lonell BROCKS, RN as Oncology Nurse Navigator (Oncology) Sherrod Sherrod, MD as Consulting Physician (Oncology) Cloria Annabella LITTIE, DO (Geriatric Medicine) Prentis Duwaine BROCKS, RN as Oncology Nurse Navigator   INTERVAL HISTORY:Allison [name] is a 73 y.o. female with stage IV non-small cell lung cancer (12/2017), hypertension, CVA, CAD, DVT/PE (on Lovenox ), and GERD.  Palliative ask to see for symptom management and goals of care.   SOCIAL HISTORY:     reports that she has never smoked. She has been exposed to tobacco smoke. She has never used smokeless tobacco. She reports that she does not currently use alcohol. She reports that she does not use drugs.  ADVANCE DIRECTIVES:  MOST and AD on file   CODE STATUS: DNR  PAST MEDICAL HISTORY: Past Medical History:  Diagnosis Date   Anemia    Anxiety    Arthritis    Asthma    exercise induced   Coronary artery disease    Depression    PMH   Dyspnea    GERD (gastroesophageal reflux disease)    Glaucoma    History of radiation therapy 01/05/2021   IMRT right lung  11/24/2020-01/05/2021  Dr Agent Shannon   Hypertension    lung ca dx'd 11/2017   right   Lung cancer [facility]) June 2019   Malignant pleural effusion (HCC)    right   Nuclear sclerotic cataract of right eye 02/28/2021   Dr. Lonni Gaudy, cataract surgery February 2023   PONV (postoperative nausea and vomiting)    Pre-diabetes    Raynaud's disease    Raynaud's disease    Stroke (HCC) 01/2021   balance off, some express  aphasia, weakness    ALLERGIES:  is allergic to hydrocodone -acetaminophen , other, penicillins, lactose, vicodin [hydrocodone -acetaminophen ], bacid, and ipratropium-albuterol .  MEDICATIONS:  Current Outpatient Medications  Medication Sig Dispense Refill   ALPRAZolam  (XANAX ) 0.25 MG tablet Take 1 tablet (0.25 mg total) by mouth 2 (two) times daily as needed for anxiety. May take 2 tablets (0.5 mg total) at bedtime. (Patient taking differently: Take 0.25-0.5 mg by mouth 2 (two) times daily as needed for anxiety or sleep.) 60 tablet 0   ALPRAZolam  (XANAX ) 0.5 MG tablet Take 1 tablet (0.5 mg total) by mouth 3 (three) times daily as needed. 90 tablet 3   amLODipine  (NORVASC ) 5 MG tablet Take 1 tablet (5 mg total) by mouth daily. 90 tablet 3   carvedilol  (COREG ) 12.5 MG tablet Take 1 tablet (12.5 mg total) by mouth 2 (two) times daily. 180 tablet 3   cycloSPORINE  (RESTASIS ) 0.05 % ophthalmic emulsion Place 1 drop into both eyes 2 (two) times daily. 180 each 3   dexamethasone  (DECADRON ) 4 MG tablet Take 1 tablet twice a day the day before, day of, and day after treatment (Patient taking differently: Take 4 mg by mouth 2 (two) times daily. Takes day before, day of and day after chemo every 3 weeks) 40 tablet 2   dorzolamide -timolol  (COSOPT ) 22.3-6.8 MG/ML ophthalmic solution Place 1 drop into both eyes 2 (two) times daily.     enoxaparin  (LOVENOX ) 60 MG/0.6ML injection Inject 0.55 mLs (55  mg total) into the skin every 12 (twelve) hours. (Patient taking differently: Inject 80 mg into the skin daily.) 100 mL 1   estradiol  (ESTRACE ) 0.1 MG/GM vaginal cream Place a pea sized amount to upper vulvar are and just inside vagina 2-3 (two to three) times a week. 42.5 g 1   ezetimibe  (ZETIA ) 10 MG tablet Take 10 mg by mouth daily.     famotidine  (PEPCID ) 20 MG tablet TAKE 1 TABLET BY MOUTH TWICE A DAY (Patient taking differently: Take 20 mg by mouth daily.) 180 tablet 1   FeFum-FePoly-FA-B Cmp-C-Biot (FOLIVANE-PLUS)  CAPS Take 1 capsule by mouth in the morning. (Patient taking differently: Take 1 capsule by mouth every other day.) 90 capsule 0   fentaNYL  (DURAGESIC ) 12 MCG/HR Place 1 patch onto the skin every 3 (three) days. 10 patch 0   FLUoxetine  (PROZAC ) 20 MG capsule Take 1 capsule (20 mg total) by mouth daily. 90 capsule 3   folic acid  (FOLVITE ) 1 MG tablet Take 1 tablet (1 mg total) by mouth daily. 90 tablet 1   Glycopyrrolate -Formoterol  (BEVESPI  AEROSPHERE) 9-4.8 MCG/ACT AERO Inhale 2 puffs into the lungs 2 (two) times daily. 3 each 3   HYDROcodone  bit-homatropine (HYCODAN) 5-1.5 MG/5ML syrup Take 5 mLs by mouth every 6 (six) hours as needed for cough. 240 mL 0   hyoscyamine  (LEVSIN  SL) 0.125 MG SL tablet Place 1-2 tablets (0.125-0.25mg ) under the tongue every 6 (six) hours as needed for cramping. 30 tablet 3   isosorbide  mononitrate (IMDUR ) 30 MG 24 hr tablet Take 1 tablet (30 mg total) by mouth daily. 90 tablet 3   latanoprost  (XALATAN ) 0.005 % ophthalmic solution Place 1 drop into both eyes at bedtime.      lidocaine  (LIDODERM ) 5 % Place 1 patch onto the skin daily. Remove & Discard patch within 12 hours or as directed by MD 9 patch 0   lidocaine -prilocaine  (EMLA ) cream Apply 1 Application topically as needed. 30 g 0   nitroGLYCERIN  (NITROSTAT ) 0.4 MG SL tablet Place 1 tablet (0.4 mg total) under the tongue every 5 (five) minutes as needed for chest pain. 25 tablet 3   ondansetron  (ZOFRAN -ODT) 4 MG disintegrating tablet Take 1 tablet (4 mg total) by mouth every 8 (eight) hours as needed for nausea or vomiting. 30 tablet 1   osimertinib  mesylate (TAGRISSO ) 80 MG tablet Take 1 tablet (80 mg total) by mouth daily. 30 tablet 4   oxyCODONE  (OXY IR/ROXICODONE ) 5 MG immediate release tablet Take 1-2 tablets (5-10 mg total) by mouth every 4 (four) hours as needed for severe pain (pain score 7-10) or breakthrough pain. 60 tablet 0   predniSONE  (DELTASONE ) 10 MG tablet Take 1 tablet (10 mg total) by mouth daily  with breakfast. 30 tablet 2   prochlorperazine  (COMPAZINE ) 10 MG tablet Take 1 tablet (10 mg total) by mouth every 6 (six) hours as needed. 30 tablet 2   REPATHA  SURECLICK 140 MG/ML SOAJ Inject 140 mg into the skin every 14 (fourteen) days. 6 mL 3   senna-docusate (SENNA S) 8.6-50 MG tablet Take 2 tablets by mouth at bedtime. 60 tablet 3   Spacer/Aero-Holding Chambers (AEROCHAMBER PLUS) Device Needed to be able use inhaler properly for best results 1 each 0   triamcinolone  cream (KENALOG ) 0.1 % Apply a thin layer topically to the affected area 2 (two) times daily. 30 g 3   Wheat Dextrin (BENEFIBER DRINK MIX) PACK Take 1 packet by mouth daily. (Patient taking differently: Take 1 packet by mouth  daily as needed (fiber).)     No current facility-administered medications for this visit.   Facility-Administered Medications Ordered in Other Visits  Medication Dose Route Frequency Provider Last Rate Last Admin   0.9 %  sodium chloride  infusion   Intravenous Continuous Sherrod Sherrod, MD 10 mL/hr at 05/13/24 0924 New Bag at 05/13/24 0924   PEMEtrexed  Disodium (ALIMTA ) 600 mg in sodium chloride  0.9 % 100 mL chemo infusion  400 mg/m2 (Treatment Plan Recorded) Intravenous Once Mohamed, Mohamed, MD 744 mL/hr at 05/13/24 1015 600 mg at 05/13/24 1015   sodium chloride  flush (NS) 0.9 % injection 10 mL  10 mL Intracatheter PRN Sherrod Sherrod, MD       Assessment NAD RRR Normal breathing pattern AAO x3  VITAL SIGNS: LMP  (LMP Unknown)  There were no vitals filed for this visit.  Estimated body mass index is 23.69 kg/m as calculated from the following:   Height as of an earlier encounter on 05/13/24: 5' 4 (1.626 m).   Weight as of an earlier encounter on 05/13/24: 138 lb (62.6 kg).   PERFORMANCE STATUS (ECOG) : 2 - Symptomatic, <50% confined to bed  IMPRESSION: Discussed the use of AI scribe software for clinical note transcription with the patient, who gave verbal consent to proceed.  History  of Present Illness Allison [name] is a 73 year old female who was seen during infusion for symptom management follow-up. Denies concerns of nausea, vomiting, constipation, or diarrhea that is uncontrolled. Reports her bowel movements are regular.  Appetite continues to fluctuates. Some days are better than others.  Weight  is up to 138lbs.   Shares challenges of night sweats which results in body odor which she relates to current chemotherapy medications. She was drinking clove tea however due to potential interactions with treatment has been advised to stop. She has history of colitis. Advised against use of apple cider vinegar. Encouraged patient to consider soaking in tub with use of vinegar and other neutralizing agents.   Ms. Radick reports overall pain is well managed. Sometimes pain is more intense pending level of activity. Does not require daily use of breakthrough medication. Her current regimen consiste of fentanyl  patch 12mcg and oxycodone  5-10mg  as needed. No adjustments to regimen at this time.   All questions answered and support provided.   Goals of Care Ms. Emigh and family continue to remain hopeful for stability with treatment.  Her goal is to continue to treat the treatable allow her every opportunity to continue to thrive while aggressively managing her symptoms.  Her quality of life is most important to her and her family.  I discussed the importance of continued conversation with family and their medical providers regarding overall plan of care and treatment options, ensuring decisions are within the context of the patients values and GOCs. Assessment & Plan Cancer related pain Fentanyl  patch and oxycodone  used for pain control. Reports pain is controlled most days.  - Continue oxycodone  5-10 mg every 4-6 hours as needed. - Fentanyl  12 mcg patch for long-acting pain relief, change every three days. -Continue Xanax  as needed for anxiety. -Will continue to closely monitor  and adjust regimen as needed  Constipation Much improved with use of Senna.  -Continue Senna-S 2 tablets at bedtime.   Anxiety/Depression Currently taking 20 mg of Prozac  daily, which helps with symptoms. Issue with previous prescriber, anticipates needing a refill in the next three to four weeks. - Continue Prozac  20 mg daily   Sweats/Body Odor Discussed bathing with  use of neutralizing skin agent for additional support. She is aware to discontinue use of clove tea due to medication interactions.    Follow-up I will plan to see patient back in 2-3 weeks. Sooner if needed.    Patient expressed understanding and was in agreement with this plan. She also understands that She can call the clinic at any time with any questions, concerns, or complaints.   Any controlled substances utilized were prescribed in the context of palliative care. PDMP has been reviewed.   Visit consisted of counseling and education dealing with the complex and emotionally intense issues of symptom management and palliative care in the setting of serious and potentially life-threatening illness.  Levon Borer, AGPCNP-BC  Palliative Medicine Team/[facility]

## 2024-05-21 ENCOUNTER — Telehealth (HOSPITAL_COMMUNITY): Payer: Self-pay

## 2024-05-21 ENCOUNTER — Encounter (HOSPITAL_COMMUNITY): Payer: Self-pay

## 2024-05-21 NOTE — Telephone Encounter (Signed)
 Called pt to see if she was interested in participating in the PR program. No answer. Left VM.

## 2024-05-24 ENCOUNTER — Other Ambulatory Visit: Payer: Self-pay | Admitting: Adult Health

## 2024-05-26 ENCOUNTER — Other Ambulatory Visit (HOSPITAL_COMMUNITY): Payer: Self-pay

## 2024-05-26 ENCOUNTER — Telehealth (HOSPITAL_COMMUNITY): Payer: Self-pay

## 2024-05-26 ENCOUNTER — Inpatient Hospital Stay

## 2024-05-26 ENCOUNTER — Other Ambulatory Visit: Payer: Self-pay | Admitting: Physician Assistant

## 2024-05-26 ENCOUNTER — Inpatient Hospital Stay: Admitting: Physician Assistant

## 2024-05-26 DIAGNOSIS — I82403 Acute embolism and thrombosis of unspecified deep veins of lower extremity, bilateral: Secondary | ICD-10-CM

## 2024-05-26 MED ORDER — ENOXAPARIN SODIUM 100 MG/ML IJ SOSY
95.0000 mg | PREFILLED_SYRINGE | INTRAMUSCULAR | 2 refills | Status: AC
  Filled 2024-05-26: qty 90, 90d supply, fill #0

## 2024-05-26 MED ORDER — ENOXAPARIN SODIUM 80 MG/0.8ML IJ SOSY
80.0000 mg | PREFILLED_SYRINGE | Freq: Two times a day (BID) | INTRAMUSCULAR | 1 refills | Status: DC
  Filled 2024-05-26: qty 72, 45d supply, fill #0

## 2024-05-26 MED ORDER — ENOXAPARIN SODIUM 60 MG/0.6ML IJ SOSY
95.0000 mg | PREFILLED_SYRINGE | INTRAMUSCULAR | 2 refills | Status: DC
  Filled 2024-05-26: qty 30, 31d supply, fill #0

## 2024-05-26 NOTE — Telephone Encounter (Signed)
 Patient called back to get scheduled in the Pulmonary Rehab Program. Patient will come in for orientation on 12/29 and will attend the 10:15 exercise class. Patient will need to miss class on the weeks she has chemotherapy scheduled.  Sent MyChart message.

## 2024-05-27 ENCOUNTER — Other Ambulatory Visit (HOSPITAL_COMMUNITY): Payer: Self-pay

## 2024-05-27 NOTE — Progress Notes (Signed)
 Pt left this NN a VM stating that she spoke to someone named Maceo who told her that her CT isn't going to be approved because  they don't have her medical records NN reached out to [employer] for information. Scheduler provided NN number for [employer] [phone]. NN reached out to [facility]. No answer. Left VM requesting a return call.  NN called pt for update, but no answer. VM left requesting return call.

## 2024-05-27 NOTE — Progress Notes (Signed)
 NN part of secure chat with Wetzel Memos and C.Heilingoetter, Onc PA regarding pts CT scan being unapprove. Tanacia recommended a peer to peer discussion with insurance company or send an appeal and reschedule the CT. Per C.Heilingoetter, Onc PA, an appeal can be sent and the imaging rescheduled.  NN called pt to explain recommendations. Pt verbalized understanding and state she will look for the new appt in her MyChart.

## 2024-05-28 ENCOUNTER — Observation Stay (HOSPITAL_COMMUNITY)

## 2024-05-28 ENCOUNTER — Other Ambulatory Visit (HOSPITAL_COMMUNITY): Payer: Self-pay

## 2024-05-28 ENCOUNTER — Other Ambulatory Visit: Payer: Self-pay | Admitting: Physician Assistant

## 2024-05-28 DIAGNOSIS — R052 Subacute cough: Secondary | ICD-10-CM

## 2024-06-02 ENCOUNTER — Other Ambulatory Visit: Payer: Self-pay

## 2024-06-02 ENCOUNTER — Encounter: Payer: Self-pay | Admitting: Internal Medicine

## 2024-06-03 ENCOUNTER — Inpatient Hospital Stay

## 2024-06-03 ENCOUNTER — Inpatient Hospital Stay: Admitting: Internal Medicine

## 2024-06-04 ENCOUNTER — Ambulatory Visit (HOSPITAL_COMMUNITY): Admission: RE | Admit: 2024-06-04 | Discharge: 2024-06-04 | Attending: Physician Assistant

## 2024-06-04 DIAGNOSIS — C3491 Malignant neoplasm of unspecified part of right bronchus or lung: Secondary | ICD-10-CM | POA: Insufficient documentation

## 2024-06-04 MED ORDER — HEPARIN SOD (PORK) LOCK FLUSH 100 UNIT/ML IV SOLN
INTRAVENOUS | Status: AC
  Filled 2024-06-04: qty 5

## 2024-06-04 MED ORDER — IOHEXOL 300 MG/ML  SOLN
80.0000 mL | Freq: Once | INTRAMUSCULAR | Status: AC | PRN
  Administered 2024-06-04: 15:00:00 80 mL via INTRAVENOUS

## 2024-06-05 ENCOUNTER — Other Ambulatory Visit (HOSPITAL_COMMUNITY): Payer: Self-pay

## 2024-06-05 MED ORDER — MIEBO 1.338 GM/ML OP SOLN
1.0000 [drp] | Freq: Four times a day (QID) | OPHTHALMIC | 11 refills | Status: AC
  Filled 2024-06-05: qty 3, 8d supply, fill #0

## 2024-06-05 NOTE — Progress Notes (Signed)
 This NN reached out to reading room and requested pts report from her CT from 12/17 be available before her appt on 12/22 with Dr Sherrod. Spoke to MJ. MJ states she requested the report be read ASAP.

## 2024-06-08 NOTE — Progress Notes (Unsigned)
 " Patient Care Team: [name], MD as PCP - General (Internal Medicine) Kate Lonni CROME, MD as PCP - Cardiology (Cardiology) Shannon Agent, MD as Consulting Physician (Radiation Oncology) Pickenpack-Cousar, Fannie SAILOR, NP as Nurse Practitioner (Nurse Practitioner) Evertt Lonell BROCKS, RN as Oncology Nurse Navigator (Oncology) Sherrod Sherrod, MD as Consulting Physician (Oncology) Cloria Annabella CROME, DO (Geriatric Medicine) Prentis Duwaine BROCKS, RN as Oncology Nurse Navigator  Clinic Day:  06/09/2024  Referring physician: Sherrod Sherrod, MD  ASSESSMENT & PLAN:   Assessment & Plan: Adenocarcinoma of right lung, stage 4 (HCC)  Stage IV (T2 a,N2, M1a) non-small cell lung cancer, adenocarcinoma diagnosed in July 2019 and presented with right upper lobe lung mass in addition to mediastinal lymphadenopathy as well as bilateral pulmonary nodules and malignant right pleural effusion.   Biomarker Findings Microsatellite status - Cannot Be Determined Tumor Mutational Burden - Cannot Be Determined Genomic Findings For a complete list of the genes assayed, please refer to the Appendix. EGFR exon 19 deletion ([id number]>L) TP53 Y220C 7 Disease relevant genes with no reportable alterations: KRAS, ALK, BRAF, MET, RET, ERBB2, ROS1    Repeat molecular studies in 2025 showed no new mutations   PRIOR THERAPY: 1) Status post right Pleurx catheter placement by Dr. Fleeta Ochoa for drainage of malignant right pleural effusion. 2) palliative radiotherapy to the enlarging right upper lobe lung mass and mediastinum under the care of Dr. Shannon expected to be completed on January 05, 2021. 3) Systemic chemotherapy with carboplatin  for AUC of 5 and Alimta  500 Mg/M2 every 3 weeks.  First dose 03/17/2021.  The patient will also continue her current treatment with Tagrisso  80 mg p.o. daily.  She is status post 26 cycles. Starting from cycle #7, she will be on Alimta  only 400 mg/m2.  Her treatment with Alimta  has  been on hold for several months now.    CURRENT THERAPY: Tagrisso  80 mg p.o. daily.  First dose was given on 01/29/2018.  Status post 72.5 months of treatment.   IV chemotherapy with alimta  400 mg/m2 IV every 3 weeks to start around 09/11/23. Status post 11 cycles.     The patient understands the plans discussed today and is in agreement with them.  She knows to contact our office if she develops concerns prior to her next appointment.    Powell FORBES Lessen, NP  [facility] - A DEPT OF [facility] [address] [place] [address] Dept: [phone] Dept Fax: [phone]   No orders of the defined types were placed in this encounter.     CHIEF COMPLAINT:  CC: Adenocarcinoma of right lung  Current Treatment: Decrease in 80 mg p.o. daily; IV chemotherapy with Alimta  400 mg/m every 3 weeks  INTERVAL HISTORY:  Kemaya is here today for repeat clinical assessment.  She last saw Marueno, [place], on 05/13/2024.  She had restaging CT CAP on 06/04/2024.  It showed stable disease with no evidence of progressive or metastatic disease.  She denies fevers or chills. She denies pain. Her appetite is good. Her weight has been stable.  I have reviewed the past medical history, past surgical history, social history and family history with the patient and they are unchanged from previous note.  ALLERGIES:  is allergic to hydrocodone -acetaminophen , other, penicillins, lactose, vicodin [hydrocodone -acetaminophen ], bacid, and ipratropium-albuterol .  MEDICATIONS:  Current Outpatient Medications  Medication Sig Dispense Refill   ALPRAZolam  (XANAX ) 0.25 MG tablet Take 1 tablet (0.25 mg  total) by mouth 2 (two) times daily as needed for anxiety. May take 2 tablets (0.5 mg total) at bedtime. (Patient taking differently: Take 0.25-0.5 mg by mouth 2 (two) times daily as needed for anxiety or sleep.) 60 tablet 0   ALPRAZolam  (XANAX ) 0.5 MG tablet  Take 1 tablet (0.5 mg total) by mouth 3 (three) times daily as needed. 90 tablet 3   amLODipine  (NORVASC ) 5 MG tablet TAKE 1 TABLET (5 MG TOTAL) BY MOUTH DAILY. 90 tablet 3   carvedilol  (COREG ) 12.5 MG tablet Take 1 tablet (12.5 mg total) by mouth 2 (two) times daily. 180 tablet 3   cycloSPORINE  (RESTASIS ) 0.05 % ophthalmic emulsion Place 1 drop into both eyes 2 (two) times daily. 180 each 3   dexamethasone  (DECADRON ) 4 MG tablet Take 1 tablet twice a day the day before, day of, and day after treatment (Patient taking differently: Take 4 mg by mouth 2 (two) times daily. Takes day before, day of and day after chemo every 3 weeks) 40 tablet 2   dorzolamide -timolol  (COSOPT ) 22.3-6.8 MG/ML ophthalmic solution Place 1 drop into both eyes 2 (two) times daily.     enoxaparin  (LOVENOX ) 100 MG/ML injection Inject 0.95 mLs (95 mg total) into the skin daily. 30 mL 2   estradiol  (ESTRACE ) 0.1 MG/GM vaginal cream Place a pea sized amount to upper vulvar are and just inside vagina 2-3 (two to three) times a week. 42.5 g 1   ezetimibe  (ZETIA ) 10 MG tablet Take 10 mg by mouth daily.     famotidine  (PEPCID ) 20 MG tablet TAKE 1 TABLET BY MOUTH TWICE A DAY (Patient taking differently: Take 20 mg by mouth daily.) 180 tablet 1   FeFum-FePoly-FA-B Cmp-C-Biot (FOLIVANE-PLUS) CAPS Take 1 capsule by mouth in the morning. (Patient taking differently: Take 1 capsule by mouth every other day.) 90 capsule 0   fentaNYL  (DURAGESIC ) 12 MCG/HR Place 1 patch onto the skin every 3 (three) days. 10 patch 0   FLUoxetine  (PROZAC ) 20 MG capsule Take 1 capsule (20 mg total) by mouth daily. 90 capsule 3   folic acid  (FOLVITE ) 1 MG tablet Take 1 tablet (1 mg total) by mouth daily. 90 tablet 1   Glycopyrrolate -Formoterol  (BEVESPI  AEROSPHERE) 9-4.8 MCG/ACT AERO Inhale 2 puffs into the lungs 2 (two) times daily. 3 each 3   HYDROcodone  bit-homatropine (HYCODAN) 5-1.5 MG/5ML syrup Take 5 mLs by mouth every 6 (six) hours as needed for cough. 240 mL  0   hyoscyamine  (LEVSIN  SL) 0.125 MG SL tablet Place 1-2 tablets (0.125-0.25mg ) under the tongue every 6 (six) hours as needed for cramping. 30 tablet 3   isosorbide  mononitrate (IMDUR ) 30 MG 24 hr tablet Take 1 tablet (30 mg total) by mouth daily. 90 tablet 3   latanoprost  (XALATAN ) 0.005 % ophthalmic solution Place 1 drop into both eyes at bedtime.      lidocaine  (LIDODERM ) 5 % Place 1 patch onto the skin daily. Remove & Discard patch within 12 hours or as directed by MD 9 patch 0   lidocaine -prilocaine  (EMLA ) cream Apply 1 Application topically as needed. 30 g 0   nitroGLYCERIN  (NITROSTAT ) 0.4 MG SL tablet Place 1 tablet (0.4 mg total) under the tongue every 5 (five) minutes as needed for chest pain. 25 tablet 3   ondansetron  (ZOFRAN -ODT) 4 MG disintegrating tablet Take 1 tablet (4 mg total) by mouth every 8 (eight) hours as needed for nausea or vomiting. 30 tablet 1   osimertinib  mesylate (TAGRISSO ) 80 MG tablet Take 1  tablet (80 mg total) by mouth daily. 30 tablet 4   oxyCODONE  (OXY IR/ROXICODONE ) 5 MG immediate release tablet Take 1-2 tablets (5-10 mg total) by mouth every 4 (four) hours as needed for severe pain (pain score 7-10) or breakthrough pain. 60 tablet 0   Perfluorohexyloctane  (MIEBO ) 1.338 GM/ML SOLN Place 1 drop into both eyes 4 (four) times daily. 3 mL 11   predniSONE  (DELTASONE ) 10 MG tablet TAKE 1 TABLET (10 MG TOTAL) BY MOUTH DAILY WITH BREAKFAST. 30 tablet 2   prochlorperazine  (COMPAZINE ) 10 MG tablet Take 1 tablet (10 mg total) by mouth every 6 (six) hours as needed. 30 tablet 2   REPATHA  SURECLICK 140 MG/ML SOAJ Inject 140 mg into the skin every 14 (fourteen) days. 6 mL 3   senna-docusate (SENNA S) 8.6-50 MG tablet Take 2 tablets by mouth at bedtime. 60 tablet 3   Spacer/Aero-Holding Chambers (AEROCHAMBER PLUS) Device Needed to be able use inhaler properly for best results 1 each 0   triamcinolone  cream (KENALOG ) 0.1 % Apply a thin layer topically to the affected area 2 (two)  times daily. 30 g 3   Wheat Dextrin (BENEFIBER DRINK MIX) PACK Take 1 packet by mouth daily. (Patient taking differently: Take 1 packet by mouth daily as needed (fiber).)     No current facility-administered medications for this visit.    HISTORY OF PRESENT ILLNESS:   Oncology History Overview Note  Patient presented with cough, congestion, SOB, fatigue, and wight loss.  Work up showed metastatic disease.    Adenocarcinoma of right lung, stage 4 (HCC)  12/11/2017 Imaging   CT Chest IMPRESSION: 1. Large right pleural effusion with volume loss of much of the right lung. 2. Right upper lobe spiculated mass of 2.3 x 2.9 cm suspicious for primary lung carcinoma. 3. Several small nodules throughout the right lung and left lung suspicious for metastases. Some of the nodules on the right are intimately associated with pleura and could represent pleural metastases. 4. Questionable soft tissue nodule in the left upper abdomen which could represent a metastatic lesion. 5. Mixed lesion within the T9 vertebral body. Cannot exclude a metastatic process.   12/19/2017 Procedure   Thoracentesis   12/26/2017 Imaging   PET IMPRESSION: 1. Hypermetabolic spiculated 3.3 cm apical right upper lobe lung mass, compatible with primary bronchogenic adenocarcinoma. 2. Hypermetabolic right hilar and right paratracheal lymphadenopathy. 3. Moderate dependent right pleural effusion with mild right pleural thickening, compatible with known malignant right pleural effusion. 4. Hypermetabolic 1.6 cm anterior right lower lobe nodule associated with the major fissure, compatible with pulmonary metastasis. Numerous additional subcentimeter pulmonary nodules scattered in both lungs, below PET resolution, suspicious for pulmonary metastases. Recommend attention on follow-up chest CT in 3 months   01/04/2018 Initial Diagnosis   Adenocarcinoma of right lung, stage 4 (HCC)   01/10/2018 Imaging   MRI Brain  No  evidence of intracranial metastases   01/29/2018 -  Chemotherapy   Oral biologic Tagrisso  p.o. 80 mg    08/16/2020 Cancer Staging   Staging form: Lung, AJCC 8th Edition - Clinical: Stage IVA (cT2a, cN2, cM1a) - Signed by Sherrod Sherrod, MD on 08/16/2020   09/11/2023 -  Chemotherapy   Patient is on Treatment Plan : LUNG Pemetrexed  (500) q21d          REVIEW OF SYSTEMS:   Constitutional: Denies fevers, chills or abnormal weight loss Eyes: Denies blurriness of vision Ears, nose, mouth, throat, and face: Denies mucositis or sore throat Respiratory: Denies cough, dyspnea  or wheezes Cardiovascular: Denies palpitation, chest discomfort or lower extremity swelling Gastrointestinal:  Denies nausea, heartburn or change in bowel habits Skin: Denies abnormal skin rashes Lymphatics: Denies new lymphadenopathy or easy bruising Neurological:Denies numbness, tingling or new weaknesses Behavioral/Psych: Mood is stable, no new changes  All other systems were reviewed with the patient and are negative.   VITALS:  Blood pressure 124/60, pulse 79, temperature 97.9 F (36.6 C), resp. rate 17, weight 139 lb 4.8 oz (63.2 kg), SpO2 97%.  Wt Readings from Last 3 Encounters:  06/09/24 139 lb 4.8 oz (63.2 kg)  05/13/24 138 lb (62.6 kg)  04/24/24 137 lb 11.2 oz (62.5 kg)    Body mass index is 23.91 kg/m.  Performance status (ECOG): 1 - Symptomatic but completely ambulatory  PHYSICAL EXAM:   GENERAL:alert, no distress and comfortable SKIN: skin color, texture, turgor are normal, no rashes or significant lesions EYES: normal, Conjunctiva are pink and non-injected, sclera clear OROPHARYNX:no exudate, no erythema and lips, buccal mucosa, and tongue normal  NECK: supple, thyroid  normal size, non-tender, without nodularity LYMPH:  no palpable lymphadenopathy in the cervical, axillary or inguinal LUNGS: clear to auscultation and percussion with normal breathing effort HEART: regular rate & rhythm and  no murmurs and no lower extremity edema ABDOMEN:abdomen soft, non-tender and normal bowel sounds Musculoskeletal:no cyanosis of digits and no clubbing  NEURO: alert & oriented x 3 with fluent speech, no focal motor/sensory deficits  LABORATORY DATA:  I have reviewed the data as listed    Component Value Date/Time   NA 143 06/09/2024 1216   NA 140 03/04/2021 1516   K 3.7 06/09/2024 1216   CL 107 06/09/2024 1216   CO2 27 06/09/2024 1216   GLUCOSE 94 06/09/2024 1216   BUN 17 06/09/2024 1216   BUN 21 03/04/2021 1516   CREATININE 1.43 (H) 06/09/2024 1216   CALCIUM  9.2 06/09/2024 1216   PROT 6.4 (L) 06/09/2024 1216   PROT 7.1 11/01/2020 0839   ALBUMIN 4.0 06/09/2024 1216   ALBUMIN 4.6 11/01/2020 0839   AST 28 06/09/2024 1216   ALT 31 06/09/2024 1216   ALKPHOS 59 06/09/2024 1216   BILITOT 0.2 06/09/2024 1216   GFRNONAA 39 (L) 06/09/2024 1216   GFRAA 58 (L) 06/15/2020 0853   GFRAA >60 03/15/2020 0808    No results found for: SPEP, UPEP  Lab Results  Component Value Date   WBC 5.8 06/09/2024   NEUTROABS 4.1 06/09/2024   HGB 11.8 (L) 06/09/2024   HCT 36.3 06/09/2024   MCV 87.5 06/09/2024   PLT 179 06/09/2024      Chemistry      Component Value Date/Time   NA 143 06/09/2024 1216   NA 140 03/04/2021 1516   K 3.7 06/09/2024 1216   CL 107 06/09/2024 1216   CO2 27 06/09/2024 1216   BUN 17 06/09/2024 1216   BUN 21 03/04/2021 1516   CREATININE 1.43 (H) 06/09/2024 1216      Component Value Date/Time   CALCIUM  9.2 06/09/2024 1216   ALKPHOS 59 06/09/2024 1216   AST 28 06/09/2024 1216   ALT 31 06/09/2024 1216   BILITOT 0.2 06/09/2024 1216       RADIOGRAPHIC STUDIES: I have personally reviewed the radiological images as listed and agreed with the findings in the report. CT CHEST ABDOMEN PELVIS W CONTRAST Result Date: 06/05/2024 CLINICAL DATA:  Restaging metastatic adenocarcinoma of the right lung. * Tracking Code: BO * EXAM: CT CHEST, ABDOMEN, AND PELVIS WITH  CONTRAST TECHNIQUE:  Multidetector CT imaging of the chest, abdomen and pelvis was performed following the standard protocol during bolus administration of intravenous contrast. RADIATION DOSE REDUCTION: This exam was performed according to the departmental dose-optimization program which includes automated exposure control, adjustment of the mA and/or kV according to patient size and/or use of iterative reconstruction technique. CONTRAST:  80mL OMNIPAQUE  IOHEXOL  300 MG/ML  SOLN COMPARISON:  Multiple prior imaging studies. The most recent CT scan is 03/17/2024 FINDINGS: CT CHEST FINDINGS Cardiovascular: The heart is normal in size. No pericardial effusion. The aorta is normal in caliber. Stable mild tortuosity and scattered atherosclerotic calcifications. No dissection. Stable three-vessel coronary artery calcifications. The pulmonary arteries are grossly normal. Mediastinum/Nodes: No mediastinal or hilar lymphadenopathy. Stable matted soft tissue density in the right hilum and right mediastinum consistent with treated disease. No findings suspicious for recurrent tumor. The esophagus is grossly normal. The thyroid  gland is unremarkable. Inconsequential small nodules are stable. Lungs/Pleura: Stable extensive post treatment changes involving the right lung with volume loss, dense radiation fibrosis and chronic pleural thickening and pleural fluid. No new worrisome findings to suggest recurrent tumor. No new pulmonary nodules to suggest pulmonary metastatic disease. Small scattered sub 4 mm nodules are stable. Stable left lower lobe calcified granuloma. Musculoskeletal: No breast masses, supraclavicular or axillary adenopathy. The bony thorax is intact. Stable mild compression deformities T6 and T7. CT ABDOMEN PELVIS FINDINGS Hepatobiliary: No focal liver abnormality is seen. No gallstones, gallbladder wall thickening, or biliary dilatation. Pancreas: Choose 1 Spleen: Normal in size without focal abnormality.  Adrenals/Urinary Tract: Adrenal glands are normal. Small stable renal cysts. No worrisome renal lesions. Areas of renal cortical scarring are stable. No hydronephrosis. The bladder is unremarkable. Stomach/Bowel: The stomach, duodenum, small bowel and colon are grossly normal without oral contrast. No inflammatory changes, mass lesions or obstructive findings. Scattered colonic diverticulosis and moderate stool burden noted. Vascular/Lymphatic: Stable atherosclerotic calcifications involving the aorta, iliac arteries and branch vessels but no aneurysm or dissection. The major venous structures are patent. No mesenteric or retroperitoneal lymphadenopathy. Reproductive: Surgically absent. Other: No pelvic mass or adenopathy. No free pelvic fluid collections. No inguinal mass or adenopathy. No abdominal wall hernia or subcutaneous lesions. Musculoskeletal: No significant bony findings. IMPRESSION: 1. Stable extensive post treatment changes involving the right lung with volume loss, dense radiation fibrosis and chronic pleural thickening and pleural fluid. No findings suspicious for recurrent tumor. 2. Stable matted soft tissue density in the right hilum and right mediastinum consistent with treated disease. 3. No findings for abdominal/pelvic metastatic disease. 4. Stable atherosclerotic calcifications involving the thoracic and abdominal aorta and branch vessels including the coronary arteries. 5. Aortic atherosclerosis. Aortic Atherosclerosis (ICD10-I70.0). Electronically Signed   By: MYRTIS Stammer M.D.   On: 06/05/2024 15:04   ADDENDUM: Hematology/Oncology Attending:  I had a face-to-face encounter with the patient today.  I reviewed her record, lab, scan and recommended her care plan.  This is a very pleasant 73 years old African-American female diagnosed with a stage IV non-small cell lung cancer, adenocarcinoma in July 2019 and molecular studies showed positive eGFR mutation with deletion in exon 19.  The  patient started treatment initially with Tagrisso  80 mg p.o. daily since August 2019 and during that time she had evidence for disease progression and additional systemic chemotherapy was added initially with carboplatin  and Alimta  then recently she has been on maintenance treatment with Alimta  initially for 26 cycles before she took a break and then resumed again status post 12 cycles.  The  patient has been tolerating the reduced dose maintenance Alimta  fairly well in addition to her oral Tagrisso . She had repeat CT scan of the chest, abdomen and pelvis performed recently.  I personally independently reviewed the scan and discussed the result with the patient and her boyfriend. The scan showed no concerning findings for disease progression. I recommended for her to continue her current maintenance treatment with Alimta  in addition to Tagrisso . She will come back for follow-up visit in 3 weeks for evaluation before the next cycle of her treatment. The patient was advised to call immediately if she has any other concerning symptoms in the interval. Disclaimer: This note was dictated with voice recognition software. Similar sounding words can inadvertently be transcribed and may be missed upon review. Sherrod MARLA Sherrod, MD  "

## 2024-06-08 NOTE — Assessment & Plan Note (Signed)
"   Stage IV (T2 a,N2, M1a) non-small cell lung cancer, adenocarcinoma diagnosed in July 2019 and presented with right upper lobe lung mass in addition to mediastinal lymphadenopathy as well as bilateral pulmonary nodules and malignant right pleural effusion.   Biomarker Findings Microsatellite status - Cannot Be Determined Tumor Mutational Burden - Cannot Be Determined Genomic Findings For a complete list of the genes assayed, please refer to the Appendix. EGFR exon 19 deletion ([id number]>L) TP53 Y220C 7 Disease relevant genes with no reportable alterations: KRAS, ALK, BRAF, MET, RET, ERBB2, ROS1    Repeat molecular studies in 2025 showed no new mutations   PRIOR THERAPY: 1) Status post right Pleurx catheter placement by Dr. Fleeta Ochoa for drainage of malignant right pleural effusion. 2) palliative radiotherapy to the enlarging right upper lobe lung mass and mediastinum under the care of Dr. Shannon expected to be completed on January 05, 2021. 3) Systemic chemotherapy with carboplatin  for AUC of 5 and Alimta  500 Mg/M2 every 3 weeks.  First dose 03/17/2021.  The patient will also continue her current treatment with Tagrisso  80 mg p.o. daily.  She is status post 26 cycles. Starting from cycle #7, she will be on Alimta  only 400 mg/m2.  Her treatment with Alimta  has been on hold for several months now.    CURRENT THERAPY: Tagrisso  80 mg p.o. daily.  First dose was given on 01/29/2018.  Status post 76 months of treatment.   IV chemotherapy with alimta  400 mg/m2 IV every 3 weeks to start around 09/11/23. Status post 12 cycles.  "

## 2024-06-09 ENCOUNTER — Other Ambulatory Visit (HOSPITAL_COMMUNITY): Payer: Self-pay

## 2024-06-09 ENCOUNTER — Inpatient Hospital Stay: Admitting: Nurse Practitioner

## 2024-06-09 ENCOUNTER — Inpatient Hospital Stay: Attending: Internal Medicine

## 2024-06-09 ENCOUNTER — Inpatient Hospital Stay

## 2024-06-09 VITALS — BP 143/68 | HR 72 | Temp 98.4°F | Resp 17

## 2024-06-09 VITALS — BP 124/60 | HR 79 | Temp 97.9°F | Resp 17 | Wt 139.3 lb

## 2024-06-09 DIAGNOSIS — C3491 Malignant neoplasm of unspecified part of right bronchus or lung: Secondary | ICD-10-CM

## 2024-06-09 DIAGNOSIS — Z5111 Encounter for antineoplastic chemotherapy: Secondary | ICD-10-CM | POA: Diagnosis present

## 2024-06-09 DIAGNOSIS — C3411 Malignant neoplasm of upper lobe, right bronchus or lung: Secondary | ICD-10-CM | POA: Diagnosis present

## 2024-06-09 LAB — CMP (CANCER CENTER ONLY)
ALT: 31 U/L (ref 0–44)
AST: 28 U/L (ref 15–41)
Albumin: 4 g/dL (ref 3.5–5.0)
Alkaline Phosphatase: 59 U/L (ref 38–126)
Anion gap: 9 (ref 5–15)
BUN: 17 mg/dL (ref 8–23)
CO2: 27 mmol/L (ref 22–32)
Calcium: 9.2 mg/dL (ref 8.9–10.3)
Chloride: 107 mmol/L (ref 98–111)
Creatinine: 1.43 mg/dL — ABNORMAL HIGH (ref 0.44–1.00)
GFR, Estimated: 39 mL/min — ABNORMAL LOW
Glucose, Bld: 94 mg/dL (ref 70–99)
Potassium: 3.7 mmol/L (ref 3.5–5.1)
Sodium: 143 mmol/L (ref 135–145)
Total Bilirubin: 0.2 mg/dL (ref 0.0–1.2)
Total Protein: 6.4 g/dL — ABNORMAL LOW (ref 6.5–8.1)

## 2024-06-09 LAB — CBC WITH DIFFERENTIAL (CANCER CENTER ONLY)
Abs Immature Granulocytes: 0.03 K/uL (ref 0.00–0.07)
Basophils Absolute: 0 K/uL (ref 0.0–0.1)
Basophils Relative: 1 %
Eosinophils Absolute: 0.1 K/uL (ref 0.0–0.5)
Eosinophils Relative: 1 %
HCT: 36.3 % (ref 36.0–46.0)
Hemoglobin: 11.8 g/dL — ABNORMAL LOW (ref 12.0–15.0)
Immature Granulocytes: 1 %
Lymphocytes Relative: 18 %
Lymphs Abs: 1 K/uL (ref 0.7–4.0)
MCH: 28.4 pg (ref 26.0–34.0)
MCHC: 32.5 g/dL (ref 30.0–36.0)
MCV: 87.5 fL (ref 80.0–100.0)
Monocytes Absolute: 0.5 K/uL (ref 0.1–1.0)
Monocytes Relative: 9 %
Neutro Abs: 4.1 K/uL (ref 1.7–7.7)
Neutrophils Relative %: 70 %
Platelet Count: 179 K/uL (ref 150–400)
RBC: 4.15 MIL/uL (ref 3.87–5.11)
RDW: 14.6 % (ref 11.5–15.5)
WBC Count: 5.8 K/uL (ref 4.0–10.5)
nRBC: 0 % (ref 0.0–0.2)

## 2024-06-09 MED ORDER — SODIUM CHLORIDE 0.9 % IV SOLN
400.0000 mg/m2 | Freq: Once | INTRAVENOUS | Status: AC
  Administered 2024-06-09: 600 mg via INTRAVENOUS
  Filled 2024-06-09: qty 20

## 2024-06-09 MED ORDER — PROCHLORPERAZINE MALEATE 10 MG PO TABS
10.0000 mg | ORAL_TABLET | Freq: Once | ORAL | Status: AC
  Administered 2024-06-09: 10 mg via ORAL
  Filled 2024-06-09: qty 1

## 2024-06-09 MED ORDER — MIEBO 1.338 GM/ML OP SOLN
1.0000 [drp] | Freq: Four times a day (QID) | OPHTHALMIC | 3 refills | Status: AC
  Filled 2024-06-09 (×2): qty 9, 24d supply, fill #0
  Filled 2024-06-11: qty 9, 90d supply, fill #0

## 2024-06-09 MED ORDER — SODIUM CHLORIDE 0.9 % IV SOLN: INTRAVENOUS | Status: DC

## 2024-06-09 MED ORDER — SODIUM CHLORIDE 0.9% FLUSH: 10.0000 mL | INTRAVENOUS | Status: DC | PRN

## 2024-06-09 NOTE — Patient Instructions (Signed)
 [facility] - A DEPT OF [facility]  Discharge Instructions: Thank you for choosing [facility] to provide your oncology and hematology care.   If you have a lab appointment with the Cancer Center, please go directly to the Cancer Center and check in at the registration area.   Wear comfortable clothing and clothing appropriate for easy access to any Portacath or PICC line.   We strive to give you quality time with your provider. You may need to reschedule your appointment if you arrive late (15 or more minutes).  Arriving late affects you and other patients whose appointments are after yours.  Also, if you miss three or more appointments without notifying the office, you may be dismissed from the clinic at the provider's discretion.      For prescription refill requests, have your pharmacy contact our office and allow 72 hours for refills to be completed.    Today you received the following chemotherapy and/or immunotherapy agents Alimta      To help prevent nausea and vomiting after your treatment, we encourage you to take your nausea medication as directed.  BELOW ARE SYMPTOMS THAT SHOULD BE REPORTED IMMEDIATELY: *FEVER GREATER THAN 100.4 F (38 C) OR HIGHER *CHILLS OR SWEATING *NAUSEA AND VOMITING THAT IS NOT CONTROLLED WITH YOUR NAUSEA MEDICATION *UNUSUAL SHORTNESS OF BREATH *UNUSUAL BRUISING OR BLEEDING *URINARY PROBLEMS (pain or burning when urinating, or frequent urination) *BOWEL PROBLEMS (unusual diarrhea, constipation, pain near the anus) TENDERNESS IN MOUTH AND THROAT WITH OR WITHOUT PRESENCE OF ULCERS (sore throat, sores in mouth, or a toothache) UNUSUAL RASH, SWELLING OR PAIN  UNUSUAL VAGINAL DISCHARGE OR ITCHING   Items with * indicate a potential emergency and should be followed up as soon as possible or go to the Emergency Department if any problems should occur.  Please show the CHEMOTHERAPY ALERT CARD or IMMUNOTHERAPY  ALERT CARD at check-in to the Emergency Department and triage nurse.  Should you have questions after your visit or need to cancel or reschedule your appointment, please contact [facility] - A DEPT OF [facility]  Dept: [phone]  and follow the prompts.  Office hours are 8:00 a.m. to 4:30 p.m. Monday - Friday. Please note that voicemails left after 4:00 p.m. may not be returned until the following business day.  We are closed weekends and major holidays. You have access to a nurse at all times for urgent questions. Please call the main number to the clinic Dept: [phone] and follow the prompts.   For any non-urgent questions, you may also contact your provider using MyChart. We now offer e-Visits for anyone 79 and older to request care online for non-urgent symptoms. For details visit [email or web address].   Also download the MyChart app! Go to the app store, search "MyChart", open the app, select [place], and log in with your MyChart username and password.

## 2024-06-10 ENCOUNTER — Other Ambulatory Visit (HOSPITAL_COMMUNITY): Payer: Self-pay

## 2024-06-11 ENCOUNTER — Telehealth (HOSPITAL_COMMUNITY): Payer: Self-pay

## 2024-06-11 ENCOUNTER — Other Ambulatory Visit: Payer: Self-pay

## 2024-06-11 ENCOUNTER — Other Ambulatory Visit (HOSPITAL_COMMUNITY): Payer: Self-pay

## 2024-06-11 NOTE — Telephone Encounter (Signed)
 Pt returned call and confirmed that she will be here 06/16/24 @1030 .

## 2024-06-11 NOTE — Telephone Encounter (Signed)
 Called pt to confirm PR orientation appt for 06/16/24. No answer. Left VM.

## 2024-06-13 ENCOUNTER — Other Ambulatory Visit (HOSPITAL_COMMUNITY): Payer: Self-pay

## 2024-06-13 ENCOUNTER — Other Ambulatory Visit: Payer: Self-pay | Admitting: Nurse Practitioner

## 2024-06-13 DIAGNOSIS — K5903 Drug induced constipation: Secondary | ICD-10-CM

## 2024-06-13 DIAGNOSIS — C3491 Malignant neoplasm of unspecified part of right bronchus or lung: Secondary | ICD-10-CM

## 2024-06-13 DIAGNOSIS — Z515 Encounter for palliative care: Secondary | ICD-10-CM

## 2024-06-13 DIAGNOSIS — Z1509 Genetic susceptibility to other malignant neoplasm: Secondary | ICD-10-CM

## 2024-06-13 DIAGNOSIS — E876 Hypokalemia: Secondary | ICD-10-CM

## 2024-06-15 ENCOUNTER — Other Ambulatory Visit (HOSPITAL_COMMUNITY): Payer: Self-pay

## 2024-06-15 ENCOUNTER — Encounter: Payer: Self-pay | Admitting: Physician Assistant

## 2024-06-15 ENCOUNTER — Encounter: Payer: Self-pay | Admitting: Internal Medicine

## 2024-06-15 MED ORDER — SENNOSIDES-DOCUSATE SODIUM 8.6-50 MG PO TABS
2.0000 | ORAL_TABLET | Freq: Every day | ORAL | 3 refills | Status: AC
  Filled 2024-06-15: qty 60, 30d supply, fill #0

## 2024-06-15 MED ORDER — HYOSCYAMINE SULFATE 0.125 MG SL SUBL
0.1250 mg | SUBLINGUAL_TABLET | Freq: Four times a day (QID) | SUBLINGUAL | 3 refills | Status: AC | PRN
  Filled 2024-06-15: qty 30, 4d supply, fill #0

## 2024-06-16 ENCOUNTER — Other Ambulatory Visit (HOSPITAL_COMMUNITY): Payer: Self-pay

## 2024-06-16 ENCOUNTER — Encounter (HOSPITAL_COMMUNITY)
Admission: RE | Admit: 2024-06-16 | Discharge: 2024-06-16 | Disposition: A | Source: Ambulatory Visit | Attending: Pulmonary Disease | Admitting: Pulmonary Disease

## 2024-06-16 ENCOUNTER — Other Ambulatory Visit: Payer: Self-pay | Admitting: Nurse Practitioner

## 2024-06-16 ENCOUNTER — Encounter (HOSPITAL_COMMUNITY): Payer: Self-pay

## 2024-06-16 VITALS — BP 134/70 | HR 72 | Ht 64.0 in | Wt 138.2 lb

## 2024-06-16 DIAGNOSIS — R0609 Other forms of dyspnea: Secondary | ICD-10-CM | POA: Insufficient documentation

## 2024-06-16 DIAGNOSIS — K5903 Drug induced constipation: Secondary | ICD-10-CM

## 2024-06-16 DIAGNOSIS — G893 Neoplasm related pain (acute) (chronic): Secondary | ICD-10-CM

## 2024-06-16 DIAGNOSIS — Z515 Encounter for palliative care: Secondary | ICD-10-CM

## 2024-06-16 DIAGNOSIS — Z1509 Genetic susceptibility to other malignant neoplasm: Secondary | ICD-10-CM

## 2024-06-16 MED ORDER — OXYCODONE HCL 5 MG PO TABS
5.0000 mg | ORAL_TABLET | ORAL | 0 refills | Status: AC | PRN
  Filled 2024-06-16: qty 60, 5d supply, fill #0

## 2024-06-16 NOTE — Progress Notes (Signed)
 Allison [name] 73 y.o. female Pulmonary Rehab Orientation Note This patient who was referred to Pulmonary Rehab by Dr. Annella with the diagnosis of DOE arrived today in Cardiac and Pulmonary Rehab. She  arrived ambulatory with assistive device with normal gait. She  does not carry portable oxygen. Per patient, Allison [name] uses oxygen never. Color good, skin warm and dry. Patient is oriented to time and place. Patient's medical history, psychosocial health, and medications reviewed. Psychosocial assessment reveals patient lives with alone. Allison [name] is currently retired. Patient hobbies include watching tv and spending time with others. Patient reports her stress level is moderate. Areas of stress/anxiety include health. Patient does not exhibit signs of depression. PHQ2/9 score 0/2. Allison [name] shows good  coping skills with positive outlook on life. Offered emotional support and reassurance. Will continue to monitor. Physical assessment performed by Allison Levin RN. Please see their orientation physical assessment note. Allison [name] reports she does take medications as prescribed. Patient states she follows a regular  diet. The patient has been trying to gain weight by increasing caloric intake. Patient's weight will be monitored closely. Demonstration and practice of PLB using pulse oximeter. Allison [name] able to return demonstration satisfactorily. Safety and hand hygiene in the exercise area reviewed with patient. Allison [name] voices understanding of the information reviewed. Department expectations discussed with patient and achievable goals were set. The patient shows enthusiasm about attending the program and we look forward to working with Allison. Cena completed a 6 min walk test today and is scheduled to begin exercise on 06/25/23 at 10:15 am.  [id number] Teryl Gubler J Kannon Granderson, MS, ACSM-CEP

## 2024-06-16 NOTE — Progress Notes (Signed)
 Pulmonary Individual Treatment Plan  Patient Details  Name: Allison [name] MRN: [id number] Date of Birth: 09-Feb-1951 Referring Provider:   Conrad Ports Pulmonary Rehab Walk Test from 06/16/2024 in [facility] for Heart, Vascular, & Lung Health  Referring Provider Hunsucker    Initial Encounter Date:  Flowsheet Row Pulmonary Rehab Walk Test from 06/16/2024 in [facility] for Heart, Vascular, & Lung Health  Date 06/16/24    Visit Diagnosis: Dyspnea on exertion  Patient's Home Medications on Admission:  Current Medications[1]  Past Medical History: Past Medical History:  Diagnosis Date   Anemia    Anxiety    Arthritis    Asthma    exercise induced   Coronary artery disease    Depression    PMH   Dyspnea    GERD (gastroesophageal reflux disease)    Glaucoma    History of radiation therapy 01/05/2021   IMRT right lung  11/24/2020-01/05/2021  Dr Lynwood Nasuti   Hypertension    lung ca dx'd 11/2017   right   Lung cancer [facility]) June 2019   Malignant pleural effusion [facility])    right   Nuclear sclerotic cataract of right eye 02/28/2021   Dr. Lonni Gaudy, cataract surgery February 2023   PONV (postoperative nausea and vomiting)    Pre-diabetes    Raynaud's disease    Raynaud's disease    Stroke (HCC) 01/2021   balance off, some express aphasia, weakness    Tobacco Use: Tobacco Use History[2]  Labs: Review Flowsheet  More data exists      Latest Ref Rng & Units 11/01/2020 02/07/2021 02/12/2021 04/21/2021 10/02/2023  Labs for ITP Cardiac and Pulmonary Rehab  Cholestrol 100 - 199 mg/dL 846  - 824  - 850   LDL (calc) 0 - 99 mg/dL 74  - 91  - 77   HDL-C >39 mg/dL 64  - 62  - 60   Trlycerides 0 - 149 mg/dL 77  - 889  - 60   Hemoglobin A1c 4.8 - 5.6 % - - 5.7  - -  Bicarbonate 20.0 - 28.0 mmol/L - 23.4  - - -  TCO2 22 - 32 mmol/L - - - 26  -  Acid-base deficit 0.0 - 2.0 mmol/L - 0.2  - - -  O2 Saturation % - 76.1  - - -     Capillary Blood Glucose: Lab Results  Component Value Date   GLUCAP 109 (H) 08/13/2023   GLUCAP 94 04/21/2021   GLUCAP 88 11/09/2020   GLUCAP 87 12/26/2017     Pulmonary Assessment Scores:  Pulmonary Assessment Scores     Row Name 06/16/24 1126         ADL UCSD   ADL Phase Entry     SOB Score total 54       CAT Score   CAT Score 22       mMRC Score   mMRC Score 3       UCSD: Self-administered rating of dyspnea associated with activities of daily living (ADLs) 6-point scale (0 = not at all to 5 = maximal or unable to do because of breathlessness)  Scoring Scores range from 0 to 120.  Minimally important difference is 5 units  CAT: CAT can identify the health impairment of COPD patients and is better correlated with disease progression.  CAT has a scoring range of zero to 40. The CAT score is classified into four groups of low (less than 10),  medium (10 - 20), high (21-30) and very high (31-40) based on the impact level of disease on health status. A CAT score over 10 suggests significant symptoms.  A worsening CAT score could be explained by an exacerbation, poor medication adherence, poor inhaler technique, or progression of COPD or comorbid conditions.  CAT MCID is 2 points  mMRC: mMRC (Modified Medical Research Council) Dyspnea Scale is used to assess the degree of baseline functional disability in patients of respiratory disease due to dyspnea. No minimal important difference is established. A decrease in score of 1 point or greater is considered a positive change.   Pulmonary Function Assessment:  Pulmonary Function Assessment - 06/16/24 1045       Breath   Bilateral Breath Sounds Decreased;Rhonchi    Shortness of Breath Yes;Limiting activity          Exercise Target Goals: Exercise Program Goal: Individual exercise prescription set using results from initial 6 min walk test and THRR while considering  patients activity barriers and safety.    Exercise Prescription Goal: Initial exercise prescription builds to 30-45 minutes a day of aerobic activity, 2-3 days per week.  Home exercise guidelines will be given to patient during program as part of exercise prescription that the participant will acknowledge.  Activity Barriers & Risk Stratification:  Activity Barriers & Cardiac Risk Stratification - 06/16/24 1045       Activity Barriers & Cardiac Risk Stratification   Activity Barriers Muscular Weakness;Deconditioning;Shortness of Breath;Assistive Device;Balance Concerns          6 Minute Walk:  6 Minute Walk     Row Name 06/16/24 1130         6 Minute Walk   Phase Initial     Distance 850 feet     Walk Time 6 minutes     # of Rest Breaks 0     MPH 1.61     METS 2.12     RPE 13     Perceived Dyspnea  2     VO2 Peak 7.41     Symptoms No     Resting HR 72 bpm     Resting BP 134/70     Resting Oxygen Saturation  99 %     Exercise Oxygen Saturation  during 6 min walk 96 %     Max Ex. HR 93 bpm     Max Ex. BP 144/70     2 Minute Post BP 130/70       Interval HR   1 Minute HR 79     2 Minute HR 92     3 Minute HR 92     4 Minute HR 93     5 Minute HR 91     6 Minute HR 93     2 Minute Post HR 74     Interval Heart Rate? Yes       Interval Oxygen   Interval Oxygen? Yes     Baseline Oxygen Saturation % 99 %     1 Minute Oxygen Saturation % 99 %     1 Minute Liters of Oxygen 0 L     2 Minute Oxygen Saturation % 96 %     2 Minute Liters of Oxygen 0 L     3 Minute Oxygen Saturation % 98 %     3 Minute Liters of Oxygen 0 L     4 Minute Oxygen Saturation % 98 %     4 Minute  Liters of Oxygen 0 L     5 Minute Oxygen Saturation % 99 %     5 Minute Liters of Oxygen 0 L     6 Minute Oxygen Saturation % 98 %     6 Minute Liters of Oxygen 0 L     2 Minute Post Oxygen Saturation % 100 %     2 Minute Post Liters of Oxygen 0 L        Oxygen Initial Assessment:  Oxygen Initial Assessment - 06/16/24 1045        Home Oxygen   Home Oxygen Device None    Sleep Oxygen Prescription None    Home Exercise Oxygen Prescription None    Home Resting Oxygen Prescription None      Initial 6 min Walk   Oxygen Used None      Program Oxygen Prescription   Program Oxygen Prescription None      Intervention   Short Term Goals To learn and understand importance of monitoring SPO2 with pulse oximeter and demonstrate accurate use of the pulse oximeter.;To learn and understand importance of maintaining oxygen saturations>88%;To learn and demonstrate proper pursed lip breathing techniques or other breathing techniques. ;To learn and demonstrate proper use of respiratory medications    Long  Term Goals Verbalizes importance of monitoring SPO2 with pulse oximeter and return demonstration;Maintenance of O2 saturations>88%;Exhibits proper breathing techniques, such as pursed lip breathing or other method taught during program session;Compliance with respiratory medication;Demonstrates proper use of MDIs          Oxygen Re-Evaluation:   Oxygen Discharge (Final Oxygen Re-Evaluation):   Initial Exercise Prescription:  Initial Exercise Prescription - 06/16/24 1100       Date of Initial Exercise RX and Referring Provider   Date 06/16/24    Referring Provider Hunsucker    Expected Discharge Date 09/12/23      NuStep   Level 1    SPM 60    Minutes 15    METs 1.8      Track   Minutes 15    METs 2      Prescription Details   Frequency (times per week) 2    Duration Progress to 30 minutes of continuous aerobic without signs/symptoms of physical distress      Intensity   THRR 40-80% of Max Heartrate 59-118    Ratings of Perceived Exertion 11-13    Perceived Dyspnea 0-4      Progression   Progression Continue to progress workloads to maintain intensity without signs/symptoms of physical distress.      Resistance Training   Training Prescription Yes    Weight red bands    Reps 10-15           Perform Capillary Blood Glucose checks as needed.  Exercise Prescription Changes:   Exercise Comments:   Exercise Goals and Review:   Exercise Goals     Row Name 06/16/24 1045             Exercise Goals   Increase Physical Activity Yes       Intervention Provide advice, education, support and counseling about physical activity/exercise needs.;Develop an individualized exercise prescription for aerobic and resistive training based on initial evaluation findings, risk stratification, comorbidities and participant's personal goals.       Expected Outcomes Short Term: Attend rehab on a regular basis to increase amount of physical activity.;Long Term: Add in home exercise to make exercise part of routine and to increase amount of  physical activity.;Long Term: Exercising regularly at least 3-5 days a week.       Increase Strength and Stamina Yes       Intervention Provide advice, education, support and counseling about physical activity/exercise needs.;Develop an individualized exercise prescription for aerobic and resistive training based on initial evaluation findings, risk stratification, comorbidities and participant's personal goals.       Expected Outcomes Short Term: Increase workloads from initial exercise prescription for resistance, speed, and METs.;Short Term: Perform resistance training exercises routinely during rehab and add in resistance training at home;Long Term: Improve cardiorespiratory fitness, muscular endurance and strength as measured by increased METs and functional capacity ( )       Able to understand and use rate of perceived exertion (RPE) scale Yes       Intervention Provide education and explanation on how to use RPE scale       Expected Outcomes Short Term: Able to use RPE daily in rehab to express subjective intensity level;Long Term:  Able to use RPE to guide intensity level when exercising independently       Able to understand and use Dyspnea scale  Yes       Intervention Provide education and explanation on how to use Dyspnea scale       Expected Outcomes Short Term: Able to use Dyspnea scale daily in rehab to express subjective sense of shortness of breath during exertion;Long Term: Able to use Dyspnea scale to guide intensity level when exercising independently       Knowledge and understanding of Target Heart Rate Range (THRR) Yes       Intervention Provide education and explanation of THRR including how the numbers were predicted and where they are located for reference       Expected Outcomes Short Term: Able to state/look up THRR;Long Term: Able to use THRR to govern intensity when exercising independently;Short Term: Able to use daily as guideline for intensity in rehab       Understanding of Exercise Prescription Yes       Intervention Provide education, explanation, and written materials on patient's individual exercise prescription       Expected Outcomes Short Term: Able to explain program exercise prescription;Long Term: Able to explain home exercise prescription to exercise independently          Exercise Goals Re-Evaluation :   Discharge Exercise Prescription (Final Exercise Prescription Changes):   Nutrition:  Target Goals: Understanding of nutrition guidelines, daily intake of sodium 1500mg , cholesterol 200mg , calories 30% from fat and 7% or less from saturated fats, daily to have 5 or more servings of fruits and vegetables.  Biometrics:    Nutrition Therapy Plan and Nutrition Goals:   Nutrition Assessments:  MEDIFICTS Score Key: >=70 Need to make dietary changes  40-70 Heart Healthy Diet <= 40 Therapeutic Level Cholesterol Diet  Flowsheet Row PULMONARY REHAB OTHER RESPIRATORY from 09/12/2022 in [facility] for Heart, Vascular, & Lung Health  Picture Your Plate Total Score on Discharge 58   Picture Your Plate Scores: <59 Unhealthy dietary pattern with much room for  improvement. 41-50 Dietary pattern unlikely to meet recommendations for good health and room for improvement. 51-60 More healthful dietary pattern, with some room for improvement.  >60 Healthy dietary pattern, although there may be some specific behaviors that could be improved.    Nutrition Goals Re-Evaluation:   Nutrition Goals Discharge (Final Nutrition Goals Re-Evaluation):   Psychosocial: Target Goals: Acknowledge presence or absence of significant depression  and/or stress, maximize coping skills, provide positive support system. Participant is able to verbalize types and ability to use techniques and skills needed for reducing stress and depression.  Initial Review & Psychosocial Screening:  Initial Psych Review & Screening - 06/16/24 1043       Initial Review   Current issues with Current Anxiety/Panic      Family Dynamics   Good Support System? Yes    Comments Patient is on medication for anxiety.      Barriers   Psychosocial barriers to participate in program There are no identifiable barriers or psychosocial needs.      Screening Interventions   Interventions Encouraged to exercise          Quality of Life Scores:  Scores of 19 and below usually indicate a poorer quality of life in these areas.  A difference of  2-3 points is a clinically meaningful difference.  A difference of 2-3 points in the total score of the Quality of Life Index has been associated with significant improvement in overall quality of life, self-image, physical symptoms, and general health in studies assessing change in quality of life.  PHQ-9: Review Flowsheet  More data exists      06/16/2024 06/09/2024 05/13/2024 04/22/2024 03/24/2024  Depression screen PHQ 2/9  Decreased Interest 0 0 0 0 0 0  Down, Depressed, Hopeless 0 0 0 0 0 0  PHQ - 2 Score 0 0 0 0 0 0  Altered sleeping 1 - - 0 -  Tired, decreased energy 1 - - 1 -  Change in appetite 0 - - 0 -  Feeling bad or failure about  yourself  0 - - 0 -  Trouble concentrating 0 - - 1 -  Moving slowly or fidgety/restless 0 - - 0 -  Suicidal thoughts 0 - - 0 -  PHQ-9 Score 2 - - 2  -  Difficult doing work/chores Somewhat difficult - - - -    Details       Data saved with a previous flowsheet row definition   Multiple values from one day are sorted in reverse-chronological order        Interpretation of Total Score  Total Score Depression Severity:  1-4 = Minimal depression, 5-9 = Mild depression, 10-14 = Moderate depression, 15-19 = Moderately severe depression, 20-27 = Severe depression   Psychosocial Evaluation and Intervention:  Psychosocial Evaluation - 06/16/24 1044       Psychosocial Evaluation & Interventions   Interventions Encouraged to exercise with the program and follow exercise prescription    Comments pt denies psychosocial barriers    Expected Outcomes Pt will benefit from PR without any psychosocial barriers or concerns    Continue Psychosocial Services  No Follow up required          Psychosocial Re-Evaluation:   Psychosocial Discharge (Final Psychosocial Re-Evaluation):   Education: Education Goals: Education classes will be provided on a weekly basis, covering required topics. Participant will state understanding/return demonstration of topics presented.  Learning Barriers/Preferences:  Learning Barriers/Preferences - 06/16/24 1044       Learning Barriers/Preferences   Learning Barriers Sight    Learning Preferences Skilled Demonstration;Pictoral;Written Material          Education Topics: Know Your Numbers Group instruction that is supported by a PowerPoint presentation. Instructor discusses importance of knowing and understanding resting, exercise, and post-exercise oxygen saturation, heart rate, and blood pressure. Oxygen saturation, heart rate, blood pressure, rating of perceived exertion, and dyspnea  are reviewed along with a normal range for these values.     Exercise for the Pulmonary Patient Group instruction that is supported by a PowerPoint presentation. Instructor discusses benefits of exercise, core components of exercise, frequency, duration, and intensity of an exercise routine, importance of utilizing pulse oximetry during exercise, safety while exercising, and options of places to exercise outside of rehab.    MET Level  Group instruction provided by PowerPoint, verbal discussion, and written material to support subject matter. Instructor reviews what METs are and how to increase METs.  Flowsheet Row PULMONARY REHAB OTHER RESPIRATORY from 07/18/2022 in [facility] for Heart, Vascular, & Lung Health  Date 07/18/22  Educator EP  Instruction Review Code 1- Verbalizes Understanding    Pulmonary Medications Verbally interactive group education provided by instructor with focus on inhaled medications and proper administration. Flowsheet Row PULMONARY REHAB OTHER RESPIRATORY from 09/14/2022 in [facility] for Heart, Vascular, & Lung Health  Date 09/14/22  Educator RT  Instruction Review Code 1- Verbalizes Understanding    Anatomy and Physiology of the Respiratory System Group instruction provided by PowerPoint, verbal discussion, and written material to support subject matter. Instructor reviews respiratory cycle and anatomical components of the respiratory system and their functions. Instructor also reviews differences in obstructive and restrictive respiratory diseases with examples of each.  Flowsheet Row PULMONARY REHAB OTHER RESPIRATORY from 08/24/2022 in [facility] for Heart, Vascular, & Lung Health  Date 08/24/22  Educator EP  Instruction Review Code 1- Verbalizes Understanding    Oxygen Safety Group instruction provided by PowerPoint, verbal discussion, and written material to support subject matter. There is an overview of What is Oxygen and Why do we  need it.  Instructor also reviews how to create a safe environment for oxygen use, the importance of using oxygen as prescribed, and the risks of noncompliance. There is a brief discussion on traveling with oxygen and resources the patient may utilize.   Oxygen Use Group instruction provided by PowerPoint, verbal discussion, and written material to discuss how supplemental oxygen is prescribed and different types of oxygen supply systems. Resources for more information are provided.  Flowsheet Row PULMONARY REHAB OTHER RESPIRATORY from 07/20/2022 in [facility] for Heart, Vascular, & Lung Health  Date 07/20/22  Educator RT  Instruction Review Code 1- Verbalizes Understanding    Breathing Techniques Group instruction that is supported by demonstration and informational handouts. Instructor discusses the benefits of pursed lip and diaphragmatic breathing and detailed demonstration on how to perform both.  Flowsheet Row PULMONARY REHAB OTHER RESPIRATORY from 07/27/2022 in [facility] for Heart, Vascular, & Lung Health  Date 07/27/22  Educator EP  Instruction Review Code 1- Verbalizes Understanding     Risk Factor Reduction Group instruction that is supported by a PowerPoint presentation. Instructor discusses the definition of a risk factor, different risk factors for pulmonary disease, and how the heart and lungs work together.   Pulmonary Diseases Group instruction provided by PowerPoint, verbal discussion, and written material to support subject matter. Instructor gives an overview of the different type of pulmonary diseases. There is also a discussion on risk factors and symptoms as well as ways to manage the diseases.   Stress and Energy Conservation Group instruction provided by PowerPoint, verbal discussion, and written material to support subject matter. Instructor gives an overview of stress and the impact it can have on the body.  Instructor also reviews  ways to reduce stress. There is also a discussion on energy conservation and ways to conserve energy throughout the day.   Warning Signs and Symptoms Group instruction provided by PowerPoint, verbal discussion, and written material to support subject matter. Instructor reviews warning signs and symptoms of stroke, heart attack, cold and flu. Instructor also reviews ways to prevent the spread of infection.   Other Education Group or individual verbal, written, or video instructions that support the educational goals of the pulmonary rehab program. Flowsheet Row PULMONARY REHAB OTHER RESPIRATORY from 08/10/2022 in [facility] for Heart, Vascular, & Lung Health  Date 08/10/22  Educator RT  Instruction Review Code 1- Verbalizes Understanding     Knowledge Questionnaire Score:  Knowledge Questionnaire Score - 06/16/24 1126       Knowledge Questionnaire Score   Pre Score 16/18          Core Components/Risk Factors/Patient Goals at Admission:  Personal Goals and Risk Factors at Admission - 06/16/24 1044       Core Components/Risk Factors/Patient Goals on Admission    Weight Management Yes;Weight Gain    Intervention Weight Management: Develop a combined nutrition and exercise program designed to reach desired caloric intake, while maintaining appropriate intake of nutrient and fiber, sodium and fats, and appropriate energy expenditure required for the weight goal.;Weight Management: Provide education and appropriate resources to help participant work on and attain dietary goals.    Expected Outcomes Short Term: Continue to assess and modify interventions until short term weight is achieved;Long Term: Adherence to nutrition and physical activity/exercise program aimed toward attainment of established weight goal;Weight Maintenance: Understanding of the daily nutrition guidelines, which includes 25-35% calories from fat, 7% or less cal from  saturated fats, less than 200mg  cholesterol, less than 1.5gm of sodium, & 5 or more servings of fruits and vegetables daily;Understanding recommendations for meals to include 15-35% energy as protein, 25-35% energy from fat, 35-60% energy from carbohydrates, less than 200mg  of dietary cholesterol, 20-35 gm of total fiber daily;Understanding of distribution of calorie intake throughout the day with the consumption of 4-5 meals/snacks;Weight Gain: Understanding of general recommendations for a high calorie, high protein meal plan that promotes weight gain by distributing calorie intake throughout the day with the consumption for 4-5 meals, snacks, and/or supplements    Improve shortness of breath with ADL's Yes    Intervention Provide education, individualized exercise plan and daily activity instruction to help decrease symptoms of SOB with activities of daily living.    Expected Outcomes Short Term: Improve cardiorespiratory fitness to achieve a reduction of symptoms when performing ADLs;Long Term: Be able to perform more ADLs without symptoms or delay the onset of symptoms    Increase knowledge of respiratory medications and ability to use respiratory devices properly  Yes    Intervention Provide education and demonstration as needed of appropriate use of medications, inhalers, and oxygen therapy.    Expected Outcomes Short Term: Achieves understanding of medications use. Understands that oxygen is a medication prescribed by physician. Demonstrates appropriate use of inhaler and oxygen therapy.;Long Term: Maintain appropriate use of medications, inhalers, and oxygen therapy.          Core Components/Risk Factors/Patient Goals Review:    Core Components/Risk Factors/Patient Goals at Discharge (Final Review):    ITP Comments: Dr. Slater Staff is Medical Director for Pulmonary Rehab at [facility].      [1]  Current Outpatient Medications:    ALPRAZolam  (XANAX ) 0.25 MG tablet, Take  1  tablet (0.25 mg total) by mouth 2 (two) times daily as needed for anxiety. May take 2 tablets (0.5 mg total) at bedtime., Disp: 60 tablet, Rfl: 0   ALPRAZolam  (XANAX ) 0.5 MG tablet, Take 1 tablet (0.5 mg total) by mouth 3 (three) times daily as needed., Disp: 90 tablet, Rfl: 3   amLODipine  (NORVASC ) 5 MG tablet, TAKE 1 TABLET (5 MG TOTAL) BY MOUTH DAILY., Disp: 90 tablet, Rfl: 3   carvedilol  (COREG ) 12.5 MG tablet, Take 1 tablet (12.5 mg total) by mouth 2 (two) times daily., Disp: 180 tablet, Rfl: 3   Cholecalciferol  (VITAMIN D3) 250 MCG (10000 UT) capsule, Take 5,000 Units by mouth daily., Disp: , Rfl:    cycloSPORINE  (RESTASIS ) 0.05 % ophthalmic emulsion, Place 1 drop into both eyes 2 (two) times daily., Disp: 180 each, Rfl: 3   dexamethasone  (DECADRON ) 4 MG tablet, Take 1 tablet twice a day the day before, day of, and day after treatment, Disp: 40 tablet, Rfl: 2   dorzolamide -timolol  (COSOPT ) 22.3-6.8 MG/ML ophthalmic solution, Place 1 drop into both eyes 2 (two) times daily., Disp: , Rfl:    enoxaparin  (LOVENOX ) 100 MG/ML injection, Inject 0.95 mLs (95 mg total) into the skin daily., Disp: 30 mL, Rfl: 2   estradiol  (ESTRACE ) 0.1 MG/GM vaginal cream, Place a pea sized amount to upper vulvar are and just inside vagina 2-3 (two to three) times a week., Disp: 42.5 g, Rfl: 1   ezetimibe  (ZETIA ) 10 MG tablet, Take 10 mg by mouth daily., Disp: , Rfl:    famotidine  (PEPCID ) 20 MG tablet, TAKE 1 TABLET BY MOUTH TWICE A DAY, Disp: 180 tablet, Rfl: 1   FeFum-FePoly-FA-B Cmp-C-Biot (FOLIVANE-PLUS) CAPS, Take 1 capsule by mouth in the morning., Disp: 90 capsule, Rfl: 0   fentaNYL  (DURAGESIC ) 12 MCG/HR, Place 1 patch onto the skin every 3 (three) days., Disp: 10 patch, Rfl: 0   FLUoxetine  (PROZAC ) 20 MG capsule, Take 1 capsule (20 mg total) by mouth daily., Disp: 90 capsule, Rfl: 3   folic acid  (FOLVITE ) 1 MG tablet, Take 1 tablet (1 mg total) by mouth daily., Disp: 90 tablet, Rfl: 1    Glycopyrrolate -Formoterol  (BEVESPI  AEROSPHERE) 9-4.8 MCG/ACT AERO, Inhale 2 puffs into the lungs 2 (two) times daily., Disp: 3 each, Rfl: 3   HYDROcodone  bit-homatropine (HYCODAN) 5-1.5 MG/5ML syrup, Take 5 mLs by mouth every 6 (six) hours as needed for cough., Disp: 240 mL, Rfl: 0   hyoscyamine  (LEVSIN  SL) 0.125 MG SL tablet, Place 1-2 tablets (0.125-0.25mg ) under the tongue every 6 (six) hours as needed for cramping., Disp: 30 tablet, Rfl: 3   isosorbide  mononitrate (IMDUR ) 30 MG 24 hr tablet, Take 1 tablet (30 mg total) by mouth daily., Disp: 90 tablet, Rfl: 3   latanoprost  (XALATAN ) 0.005 % ophthalmic solution, Place 1 drop into both eyes at bedtime. , Disp: , Rfl:    lidocaine  (LIDODERM ) 5 %, Place 1 patch onto the skin daily. Remove & Discard patch within 12 hours or as directed by MD, Disp: 9 patch, Rfl: 0   lidocaine -prilocaine  (EMLA ) cream, Apply 1 Application topically as needed., Disp: 30 g, Rfl: 0   nitroGLYCERIN  (NITROSTAT ) 0.4 MG SL tablet, Place 1 tablet (0.4 mg total) under the tongue every 5 (five) minutes as needed for chest pain., Disp: 25 tablet, Rfl: 3   ondansetron  (ZOFRAN -ODT) 4 MG disintegrating tablet, Take 1 tablet (4 mg total) by mouth every 8 (eight) hours as needed for nausea or vomiting., Disp: 30 tablet, Rfl: 1  osimertinib  mesylate (TAGRISSO ) 80 MG tablet, Take 1 tablet (80 mg total) by mouth daily., Disp: 30 tablet, Rfl: 4   oxyCODONE  (OXY IR/ROXICODONE ) 5 MG immediate release tablet, Take 1-2 tablets (5-10 mg total) by mouth every 4 (four) hours as needed for severe pain (pain score 7-10) or breakthrough pain., Disp: 60 tablet, Rfl: 0   Perfluorohexyloctane  (MIEBO ) 1.338 GM/ML SOLN, Place 1 drop into both eyes 4 (four) times daily., Disp: 3 mL, Rfl: 11   Perfluorohexyloctane  (MIEBO ) 1.338 GM/ML SOLN, Place 1 drop into both eyes 4 (four) times daily., Disp: 9 mL, Rfl: 3   predniSONE  (DELTASONE ) 10 MG tablet, TAKE 1 TABLET (10 MG TOTAL) BY MOUTH DAILY WITH BREAKFAST.,  Disp: 30 tablet, Rfl: 2   prochlorperazine  (COMPAZINE ) 10 MG tablet, Take 1 tablet (10 mg total) by mouth every 6 (six) hours as needed., Disp: 30 tablet, Rfl: 2   REPATHA  SURECLICK 140 MG/ML SOAJ, Inject 140 mg into the skin every 14 (fourteen) days., Disp: 6 mL, Rfl: 3   senna-docusate (STOOL SOFTENER/LAXATIVE) 8.6-50 MG tablet, Take 2 tablets by mouth at bedtime., Disp: 60 tablet, Rfl: 3   Spacer/Aero-Holding Chambers (AEROCHAMBER PLUS) Device, Needed to be able use inhaler properly for best results, Disp: 1 each, Rfl: 0   triamcinolone  cream (KENALOG ) 0.1 %, Apply a thin layer topically to the affected area 2 (two) times daily., Disp: 30 g, Rfl: 3   Wheat Dextrin (BENEFIBER DRINK MIX) PACK, Take 1 packet by mouth daily. (Patient not taking: Reported on 06/16/2024), Disp: , Rfl:  [2]  Social History Tobacco Use  Smoking Status Never   Passive exposure: Past  Smokeless Tobacco Never

## 2024-06-17 ENCOUNTER — Other Ambulatory Visit (HOSPITAL_COMMUNITY): Payer: Self-pay

## 2024-06-18 ENCOUNTER — Ambulatory Visit: Admitting: Cardiology

## 2024-06-20 NOTE — Telephone Encounter (Signed)
 Oral Oncology Patient Advocate Encounter   Received notification re-enrollment for assistance for Tagrisso  through [employer] has been approved. Patient may continue to receive their medication at $0 from this program.    [employer] phone number [phone].    Effective dates: 06/20/24 through 06/18/25  I have spoken to the patient.  Lucie Lamer, CPhT [place]  [facility] Oncology Pharmacy Patient Advocate Specialist [facility] [place] Phone: [phone]  Fax: [phone] [email or web address]

## 2024-06-24 ENCOUNTER — Inpatient Hospital Stay: Admitting: Internal Medicine

## 2024-06-24 ENCOUNTER — Inpatient Hospital Stay

## 2024-06-24 ENCOUNTER — Encounter (HOSPITAL_COMMUNITY)
Admission: RE | Admit: 2024-06-24 | Discharge: 2024-06-24 | Disposition: A | Discharge status: Home / Self Care | Source: Ambulatory Visit | Attending: Pulmonary Disease | Admitting: Pulmonary Disease

## 2024-06-24 VITALS — Wt 140.9 lb

## 2024-06-24 DIAGNOSIS — R0609 Other forms of dyspnea: Secondary | ICD-10-CM | POA: Diagnosis present

## 2024-06-24 NOTE — Progress Notes (Signed)
 Daily Session Note  Patient Details  Name: Allison [name] MRN: [id number] Date of Birth: Nov 30, 1950 Referring Provider:   Conrad Ports Pulmonary Rehab Walk Test from 06/16/2024 in [facility]  Referring Provider Hunsucker    Encounter Date: 06/24/2024  Check In:  Session Check In - 06/24/24 1016       Check-In   Supervising physician immediately available to respond to emergencies [facility] MD immediately available    Physician(s) Rosabel Mose, NP    Location [facility]    Staff Present Johnnie Moats, MS, ACSM-CEP, Exercise Physiologist;Mary Harvy, RN, BSN;Casey Claudene, RT;Riham Polyakov BS, ACSM-CEP, Exercise Physiologist    Virtual Visit No    Medication changes reported     No    Fall or balance concerns reported    No    Tobacco Cessation No Change    Warm-up and Cool-down Performed as group-led instruction    Resistance Training Performed Yes    VAD Patient? No    PAD/SET Patient? No      Pain Assessment   Currently in Pain? No/denies    Pain Score 0-No pain    Multiple Pain Sites No          Capillary Blood Glucose: No results found. However, due to the size of the patient record, not all encounters were searched. Please check Results Review for a complete set of results.   Exercise Prescription Changes - 06/24/24 1100       Response to Exercise   Blood Pressure (Admit) 126/64    Blood Pressure (Exercise) 152/70    Blood Pressure (Exit) 112/66    Heart Rate (Admit) 82 bpm    Heart Rate (Exercise) 93 bpm    Heart Rate (Exit) 77 bpm    Oxygen Saturation (Admit) 100 %    Oxygen Saturation (Exercise) 100 %    Oxygen Saturation (Exit) 100 %    Rating of Perceived Exertion (Exercise) 13    Perceived Dyspnea (Exercise) 1    Duration Continue with 30 min of aerobic exercise without signs/symptoms of physical distress.    Intensity THRR unchanged      Progression   Progression Continue to  progress workloads to maintain intensity without signs/symptoms of physical distress.      Resistance Training   Weight red bands    Reps 10-15    Time 10 Minutes      NuStep   Level 1    SPM 39    Minutes 15    METs 1.3      Track   Laps 7    Minutes 15    METs 2.08          Tobacco Use History[1]  Goals Met:  Exercise tolerated well No report of concerns or symptoms today Strength training completed today  Goals Unmet:  Not Applicable  Comments: First day. Service time is from 1015 to 1135.    Dr. Slater Staff is Medical Director for Pulmonary Rehab at [facility].     [1]  Social History Tobacco Use  Smoking Status Never   Passive exposure: Past  Smokeless Tobacco Never

## 2024-06-26 ENCOUNTER — Encounter (HOSPITAL_COMMUNITY)
Admission: RE | Admit: 2024-06-26 | Discharge: 2024-06-26 | Disposition: A | Discharge status: Home / Self Care | Source: Ambulatory Visit | Attending: Pulmonary Disease | Admitting: Pulmonary Disease

## 2024-06-26 DIAGNOSIS — R0609 Other forms of dyspnea: Secondary | ICD-10-CM

## 2024-06-26 NOTE — Progress Notes (Signed)
 Daily Session Note  Patient Details  Name: Allison [name] MRN: [id number] Date of Birth: [birth year] Referring Provider:   Conrad Ports Pulmonary Rehab Walk Test from 06/16/2024 in [facility]  Referring Provider Hunsucker    Encounter Date: 06/26/2024  Check In:  Session Check In - 06/26/24 1025       Check-In   Supervising physician immediately available to respond to emergencies [facility] MD immediately available    Physician(s) Damien Braver, NP    Location [facility]    Staff Present Ronal Levin, RN, BSN;Casey Claudene, RT;Randi Reeve BS, ACSM-CEP, Exercise Physiologist    Virtual Visit No    Medication changes reported     No    Fall or balance concerns reported    No    Tobacco Cessation No Change    Warm-up and Cool-down Performed as group-led instruction    Resistance Training Performed Yes    VAD Patient? No    PAD/SET Patient? No      Pain Assessment   Currently in Pain? No/denies          Capillary Blood Glucose: No results found. However, due to the size of the patient record, not all encounters were searched. Please check Results Review for a complete set of results.    Tobacco Use History[1]  Goals Met:  Independence with exercise equipment Exercise tolerated well Queuing for purse lip breathing No report of concerns or symptoms today Strength training completed today  Goals Unmet:  Not Applicable  Comments: Service time is from 1036 to 1143    Dr. Slater Staff is Medical Director for Pulmonary Rehab at [facility].     [1]  Social History Tobacco Use  Smoking Status Never   Passive exposure: Past  Smokeless Tobacco Never

## 2024-06-28 ENCOUNTER — Other Ambulatory Visit: Payer: Self-pay | Admitting: Physician Assistant

## 2024-06-28 DIAGNOSIS — K219 Gastro-esophageal reflux disease without esophagitis: Secondary | ICD-10-CM

## 2024-06-28 DIAGNOSIS — C3491 Malignant neoplasm of unspecified part of right bronchus or lung: Secondary | ICD-10-CM

## 2024-06-30 ENCOUNTER — Encounter: Payer: Self-pay | Admitting: Nurse Practitioner

## 2024-06-30 ENCOUNTER — Inpatient Hospital Stay (HOSPITAL_BASED_OUTPATIENT_CLINIC_OR_DEPARTMENT_OTHER): Admitting: Nurse Practitioner

## 2024-06-30 ENCOUNTER — Inpatient Hospital Stay

## 2024-06-30 ENCOUNTER — Other Ambulatory Visit (HOSPITAL_COMMUNITY): Payer: Self-pay

## 2024-06-30 ENCOUNTER — Telehealth: Payer: Self-pay | Admitting: *Deleted

## 2024-06-30 ENCOUNTER — Inpatient Hospital Stay: Attending: Internal Medicine

## 2024-06-30 ENCOUNTER — Inpatient Hospital Stay: Admitting: Internal Medicine

## 2024-06-30 VITALS — BP 147/68 | HR 77 | Temp 98.2°F | Resp 17 | Ht 64.0 in | Wt 138.0 lb

## 2024-06-30 DIAGNOSIS — Z515 Encounter for palliative care: Secondary | ICD-10-CM

## 2024-06-30 DIAGNOSIS — R530 Neoplastic (malignant) related fatigue: Secondary | ICD-10-CM

## 2024-06-30 DIAGNOSIS — Z1509 Genetic susceptibility to other malignant neoplasm: Secondary | ICD-10-CM | POA: Diagnosis not present

## 2024-06-30 DIAGNOSIS — T451X5A Adverse effect of antineoplastic and immunosuppressive drugs, initial encounter: Secondary | ICD-10-CM | POA: Diagnosis not present

## 2024-06-30 DIAGNOSIS — R112 Nausea with vomiting, unspecified: Secondary | ICD-10-CM | POA: Diagnosis not present

## 2024-06-30 DIAGNOSIS — C3491 Malignant neoplasm of unspecified part of right bronchus or lung: Secondary | ICD-10-CM

## 2024-06-30 DIAGNOSIS — C3490 Malignant neoplasm of unspecified part of unspecified bronchus or lung: Secondary | ICD-10-CM

## 2024-06-30 DIAGNOSIS — Z5111 Encounter for antineoplastic chemotherapy: Secondary | ICD-10-CM | POA: Diagnosis present

## 2024-06-30 DIAGNOSIS — K5903 Drug induced constipation: Secondary | ICD-10-CM

## 2024-06-30 DIAGNOSIS — G893 Neoplasm related pain (acute) (chronic): Secondary | ICD-10-CM | POA: Diagnosis not present

## 2024-06-30 DIAGNOSIS — C3411 Malignant neoplasm of upper lobe, right bronchus or lung: Secondary | ICD-10-CM | POA: Diagnosis present

## 2024-06-30 LAB — CBC WITH DIFFERENTIAL (CANCER CENTER ONLY)
Abs Immature Granulocytes: 0.03 K/uL (ref 0.00–0.07)
Basophils Absolute: 0 K/uL (ref 0.0–0.1)
Basophils Relative: 0 %
Eosinophils Absolute: 0 K/uL (ref 0.0–0.5)
Eosinophils Relative: 0 %
HCT: 37 % (ref 36.0–46.0)
Hemoglobin: 12 g/dL (ref 12.0–15.0)
Immature Granulocytes: 0 %
Lymphocytes Relative: 18 %
Lymphs Abs: 1.2 K/uL (ref 0.7–4.0)
MCH: 28.4 pg (ref 26.0–34.0)
MCHC: 32.4 g/dL (ref 30.0–36.0)
MCV: 87.5 fL (ref 80.0–100.0)
Monocytes Absolute: 0.7 K/uL (ref 0.1–1.0)
Monocytes Relative: 10 %
Neutro Abs: 4.9 K/uL (ref 1.7–7.7)
Neutrophils Relative %: 72 %
Platelet Count: 192 K/uL (ref 150–400)
RBC: 4.23 MIL/uL (ref 3.87–5.11)
RDW: 14.3 % (ref 11.5–15.5)
WBC Count: 6.9 K/uL (ref 4.0–10.5)
nRBC: 0 % (ref 0.0–0.2)

## 2024-06-30 LAB — CMP (CANCER CENTER ONLY)
ALT: 46 U/L — ABNORMAL HIGH (ref 0–44)
AST: 36 U/L (ref 15–41)
Albumin: 4 g/dL (ref 3.5–5.0)
Alkaline Phosphatase: 62 U/L (ref 38–126)
Anion gap: 11 (ref 5–15)
BUN: 18 mg/dL (ref 8–23)
CO2: 26 mmol/L (ref 22–32)
Calcium: 9.5 mg/dL (ref 8.9–10.3)
Chloride: 104 mmol/L (ref 98–111)
Creatinine: 1.38 mg/dL — ABNORMAL HIGH (ref 0.44–1.00)
GFR, Estimated: 40 mL/min — ABNORMAL LOW
Glucose, Bld: 104 mg/dL — ABNORMAL HIGH (ref 70–99)
Potassium: 3.9 mmol/L (ref 3.5–5.1)
Sodium: 141 mmol/L (ref 135–145)
Total Bilirubin: 0.2 mg/dL (ref 0.0–1.2)
Total Protein: 6.8 g/dL (ref 6.5–8.1)

## 2024-06-30 MED ORDER — SODIUM CHLORIDE 0.9 % IV SOLN: INTRAVENOUS | Status: DC

## 2024-06-30 MED ORDER — PROCHLORPERAZINE MALEATE 10 MG PO TABS
10.0000 mg | ORAL_TABLET | Freq: Four times a day (QID) | ORAL | 3 refills | Status: DC | PRN
  Filled 2024-06-30: qty 30, 8d supply, fill #0

## 2024-06-30 MED ORDER — FENTANYL 12 MCG/HR TD PT72
1.0000 | MEDICATED_PATCH | TRANSDERMAL | 0 refills | Status: AC
  Filled 2024-06-30: qty 10, 30d supply, fill #0

## 2024-06-30 MED ORDER — PROCHLORPERAZINE MALEATE 10 MG PO TABS: 10.0000 mg | ORAL_TABLET | Freq: Four times a day (QID) | ORAL | 3 refills | Status: DC | PRN

## 2024-06-30 MED ORDER — PROCHLORPERAZINE MALEATE 10 MG PO TABS
10.0000 mg | ORAL_TABLET | Freq: Once | ORAL | Status: AC
  Administered 2024-06-30: 10 mg via ORAL
  Filled 2024-06-30: qty 1

## 2024-06-30 MED ORDER — SODIUM CHLORIDE 0.9 % IV SOLN
400.0000 mg/m2 | Freq: Once | INTRAVENOUS | Status: AC
  Administered 2024-06-30: 600 mg via INTRAVENOUS
  Filled 2024-06-30: qty 20

## 2024-06-30 MED ORDER — FENTANYL 12 MCG/HR TD PT72: 1.0000 | MEDICATED_PATCH | TRANSDERMAL | 0 refills | Status: DC

## 2024-06-30 NOTE — Progress Notes (Signed)
 "     [facility] [phone]   [phone]  OFFICE PROGRESS NOTE  Allison Iha, MD [address] [place] [address]  DIAGNOSIS: Stage IV (T2 a,N2, M1a) non-small cell lung cancer, adenocarcinoma diagnosed in July 2019 and presented with right upper lobe lung mass in addition to mediastinal lymphadenopathy as well as bilateral pulmonary nodules and malignant right pleural effusion.   Biomarker Findings Microsatellite status - Cannot Be Determined Tumor Mutational Burden - Cannot Be Determined Genomic Findings For a complete list of the genes assayed, please refer to the Appendix. EGFR exon 19 deletion ([id number]) TP53 Y220C 7 Disease relevant genes with no reportable alterations: KRAS, ALK, BRAF, MET, RET, ERBB2, ROS1    PRIOR THERAPY:  1) Status post right Pleurx catheter placement by Dr. Fleeta Ochoa for drainage of malignant right pleural effusion. 2) palliative radiotherapy to the enlarging right upper lobe lung mass and mediastinum under the care of Dr. Shannon expected to be completed on January 05, 2021. 3) Systemic chemotherapy with carboplatin  for AUC of 5 and Alimta  500 Mg/M2 every 3 weeks.  First dose 03/17/2021.  The patient will also continue her current treatment with Tagrisso  80 mg p.o. daily.  She is status post 26 cycles. Starting from cycle #7, she will be on Alimta  only 400 mg/m2.  Her treatment with Alimta  has been on hold for several months now.  She is currently on treatment with single agent Tagrisso  80 mg p.o. daily    CURRENT THERAPY: Tagrisso  80 mg p.o. daily.  First dose was given on 01/29/2018.  Status post 77 months of treatment.  She also resumed maintenance treatment with Alimta  400 mg/M2 on September 11, 2023 status post 13 cycles.  INTERVAL HISTORY: Allison [name] 74 y.o. female returns to the clinic today for follow-up visit accompanied by her boyfriend, Allison and her daughter Allison [name] was available by  phone during the visit.  Discussed the use of AI scribe software for clinical note transcription with the patient, who gave verbal consent to proceed.  History of Present Illness Allison [name] is a 74 year old female with stage IV EGFR exon 19 deletion-mutated non-small cell lung adenocarcinoma who presents for pre-chemotherapy evaluation.  She was diagnosed with stage IV EGFR exon 19 deletion-mutated non-small cell lung adenocarcinoma in July 2019. Initial therapy included Tagrisso  80 mg daily, started in August 2019. Following disease progression, she was treated with carboplatin  and Alimta , and is currently on her second course of maintenance Alimta  (cycle 14) with continued Tagrisso .  She reports overall stability since her last visit. She experiences fatigue and tiredness from day 3 to day 6 following chemotherapy, with subsequent recovery and return to baseline. She continues to have night sweats, which she attributes to her malignancy. No new symptoms or concerns are reported. She denies other systemic symptoms.  She continues to take B12 (gummy and injection), vitamin D3, and a multivitamin. She uses mullein drops sublingually for perceived chest clearance. No other supplements or alternative therapies are reported.     MEDICAL HISTORY: Past Medical History:  Diagnosis Date   Anemia    Anxiety    Arthritis    Asthma    exercise induced   Coronary artery disease    Depression    PMH   Dyspnea    GERD (gastroesophageal reflux disease)    Glaucoma    History of radiation therapy 01/05/2021   IMRT right lung  11/24/2020-01/05/2021  Dr Lynwood Shannon  Hypertension    lung ca dx'd 11/2017   right   Lung cancer [facility]) June 2019   Malignant pleural effusion [facility])    right   Nuclear sclerotic cataract of right eye 02/28/2021   Dr. Lonni Gaudy, cataract surgery February 2023   PONV (postoperative nausea and vomiting)    Pre-diabetes    Raynaud's disease    Raynaud's disease     Stroke (HCC) 01/2021   balance off, some express aphasia, weakness    ALLERGIES:  is allergic to hydrocodone -acetaminophen , other, penicillins, lactose, vicodin [hydrocodone -acetaminophen ], bacid, and ipratropium-albuterol .  MEDICATIONS:  Current Outpatient Medications  Medication Sig Dispense Refill   ALPRAZolam  (XANAX ) 0.25 MG tablet Take 1 tablet (0.25 mg total) by mouth 2 (two) times daily as needed for anxiety. May take 2 tablets (0.5 mg total) at bedtime. 60 tablet 0   ALPRAZolam  (XANAX ) 0.5 MG tablet Take 1 tablet (0.5 mg total) by mouth 3 (three) times daily as needed. 90 tablet 3   amLODipine  (NORVASC ) 5 MG tablet TAKE 1 TABLET (5 MG TOTAL) BY MOUTH DAILY. 90 tablet 3   carvedilol  (COREG ) 12.5 MG tablet Take 1 tablet (12.5 mg total) by mouth 2 (two) times daily. 180 tablet 3   Cholecalciferol  (VITAMIN D3) 250 MCG (10000 UT) capsule Take 5,000 Units by mouth daily.     cycloSPORINE  (RESTASIS ) 0.05 % ophthalmic emulsion Place 1 drop into both eyes 2 (two) times daily. 180 each 3   dexamethasone  (DECADRON ) 4 MG tablet Take 1 tablet twice a day the day before, day of, and day after treatment 40 tablet 2   dorzolamide -timolol  (COSOPT ) 22.3-6.8 MG/ML ophthalmic solution Place 1 drop into both eyes 2 (two) times daily.     enoxaparin  (LOVENOX ) 100 MG/ML injection Inject 0.95 mLs (95 mg total) into the skin daily. 30 mL 2   estradiol  (ESTRACE ) 0.1 MG/GM vaginal cream Place a pea sized amount to upper vulvar are and just inside vagina 2-3 (two to three) times a week. 42.5 g 1   ezetimibe  (ZETIA ) 10 MG tablet Take 10 mg by mouth daily.     famotidine  (PEPCID ) 20 MG tablet TAKE 1 TABLET BY MOUTH TWICE A DAY 180 tablet 1   FeFum-FePoly-FA-B Cmp-C-Biot (FOLIVANE-PLUS) CAPS Take 1 capsule by mouth in the morning. 90 capsule 0   fentaNYL  (DURAGESIC ) 12 MCG/HR Place 1 patch onto the skin every 3 (three) days. 10 patch 0   FLUoxetine  (PROZAC ) 20 MG capsule Take 1 capsule (20 mg total) by mouth  daily. 90 capsule 3   folic acid  (FOLVITE ) 1 MG tablet Take 1 tablet (1 mg total) by mouth daily. 90 tablet 1   Glycopyrrolate -Formoterol  (BEVESPI  AEROSPHERE) 9-4.8 MCG/ACT AERO Inhale 2 puffs into the lungs 2 (two) times daily. 3 each 3   HYDROcodone  bit-homatropine (HYCODAN) 5-1.5 MG/5ML syrup Take 5 mLs by mouth every 6 (six) hours as needed for cough. 240 mL 0   hyoscyamine  (LEVSIN  SL) 0.125 MG SL tablet Place 1-2 tablets (0.125-0.25mg ) under the tongue every 6 (six) hours as needed for cramping. 30 tablet 3   isosorbide  mononitrate (IMDUR ) 30 MG 24 hr tablet Take 1 tablet (30 mg total) by mouth daily. 90 tablet 3   latanoprost  (XALATAN ) 0.005 % ophthalmic solution Place 1 drop into both eyes at bedtime.      lidocaine  (LIDODERM ) 5 % Place 1 patch onto the skin daily. Remove & Discard patch within 12 hours or as directed by MD 9 patch 0   lidocaine -prilocaine  (EMLA ) cream  Apply 1 Application topically as needed. 30 g 0   nitroGLYCERIN  (NITROSTAT ) 0.4 MG SL tablet Place 1 tablet (0.4 mg total) under the tongue every 5 (five) minutes as needed for chest pain. 25 tablet 3   ondansetron  (ZOFRAN -ODT) 4 MG disintegrating tablet Take 1 tablet (4 mg total) by mouth every 8 (eight) hours as needed for nausea or vomiting. 30 tablet 1   osimertinib  mesylate (TAGRISSO ) 80 MG tablet Take 1 tablet (80 mg total) by mouth daily. 30 tablet 4   oxyCODONE  (OXY IR/ROXICODONE ) 5 MG immediate release tablet Take 1-2 tablets (5-10 mg total) by mouth every 4 (four) hours as needed for severe pain (pain score 7-10) or breakthrough pain. 60 tablet 0   Perfluorohexyloctane  (MIEBO ) 1.338 GM/ML SOLN Place 1 drop into both eyes 4 (four) times daily. 3 mL 11   Perfluorohexyloctane  (MIEBO ) 1.338 GM/ML SOLN Place 1 drop into both eyes 4 (four) times daily. 9 mL 3   predniSONE  (DELTASONE ) 10 MG tablet TAKE 1 TABLET (10 MG TOTAL) BY MOUTH DAILY WITH BREAKFAST. 30 tablet 2   prochlorperazine  (COMPAZINE ) 10 MG tablet Take 1 tablet  (10 mg total) by mouth every 6 (six) hours as needed. 30 tablet 2   REPATHA  SURECLICK 140 MG/ML SOAJ Inject 140 mg into the skin every 14 (fourteen) days. 6 mL 3   senna-docusate (STOOL SOFTENER/LAXATIVE) 8.6-50 MG tablet Take 2 tablets by mouth at bedtime. 60 tablet 3   Spacer/Aero-Holding Chambers (AEROCHAMBER PLUS) Device Needed to be able use inhaler properly for best results 1 each 0   triamcinolone  cream (KENALOG ) 0.1 % Apply a thin layer topically to the affected area 2 (two) times daily. 30 g 3   Wheat Dextrin (BENEFIBER DRINK MIX) PACK Take 1 packet by mouth daily.     No current facility-administered medications for this visit.    SURGICAL HISTORY:  Past Surgical History:  Procedure Laterality Date   ABDOMINAL HYSTERECTOMY     partial   BRONCHIAL BIOPSY  04/21/2021   Procedure: BRONCHIAL BIOPSIES;  Surgeon: Brenna Adine CROME, DO;  Location: [facility];  Service: Pulmonary;;   BRONCHIAL BRUSHINGS  04/21/2021   Procedure: BRONCHIAL BRUSHINGS;  Surgeon: Brenna Adine CROME, DO;  Location: [facility];  Service: Pulmonary;;   BRONCHIAL NEEDLE ASPIRATION BIOPSY  04/21/2021   Procedure: BRONCHIAL NEEDLE ASPIRATION BIOPSIES;  Surgeon: Brenna Adine CROME, DO;  Location: [facility];  Service: Pulmonary;;   CHEST TUBE INSERTION Right 01/01/2018   Procedure: INSERTION PLEURAL DRAINAGE CATHETER;  Surgeon: Fleeta Hanford Coy, MD;  [facility] OR;  Service: Thoracic;  Laterality: Right;   CHEST TUBE INSERTION  04/21/2021   Procedure: CHEST TUBE INSERTION;  Surgeon: Brenna Adine CROME, DO;  Location: [facility];  Service: Pulmonary;;   COLONOSCOPY     CORONARY STENT INTERVENTION N/A 06/16/2019   Procedure: CORONARY STENT INTERVENTION;  Surgeon: Dann Candyce RAMAN, MD;  Location: [facility] CV LAB;  Service: Cardiovascular;  Laterality: N/A;   DILATION AND CURETTAGE OF UTERUS     EYE SURGERY     due to Glaucoma   IR IMAGING GUIDED PORT INSERTION  03/22/2021   IR PORT REPAIR CENTRAL VENOUS ACCESS  DEVICE  04/15/2021   LEFT HEART CATH AND CORONARY ANGIOGRAPHY N/A 06/16/2019   Procedure: LEFT HEART CATH AND CORONARY ANGIOGRAPHY;  Surgeon: Dann Candyce RAMAN, MD;  Location: [facility] INVASIVE CV LAB;  Service: Cardiovascular;  Laterality: N/A;   REMOVAL OF PLEURAL DRAINAGE CATHETER Right 11/07/2018   Procedure: REMOVAL OF PLEURAL DRAINAGE CATHETER;  Surgeon: Fleeta Ochoa, Maude, MD;  Location: [facility] OR;  Service: Thoracic;  Laterality: Right;   ROTATOR CUFF REPAIR     TUBAL LIGATION     VIDEO BRONCHOSCOPY WITH ENDOBRONCHIAL NAVIGATION Bilateral 04/21/2021   Procedure: VIDEO BRONCHOSCOPY WITH ENDOBRONCHIAL NAVIGATION;  Surgeon: Brenna Adine CROME, DO;  Location: [facility];  Service: Pulmonary;  Laterality: Bilateral;  ION   WISDOM TOOTH EXTRACTION      REVIEW OF SYSTEMS:  A comprehensive review of systems was negative except for: Constitutional: positive for night sweats   PHYSICAL EXAMINATION: General appearance: alert, cooperative, appears stated age, and no distress Head: Normocephalic, without obvious abnormality, atraumatic Neck: no adenopathy, no JVD, supple, symmetrical, trachea midline, and thyroid  not enlarged, symmetric, no tenderness/mass/nodules Lymph nodes: Cervical, supraclavicular, and axillary nodes normal. Resp: clear to auscultation bilaterally Back: symmetric, no curvature. ROM normal. No CVA tenderness. Cardio: regular rate and rhythm, S1, S2 normal, no murmur, click, rub or gallop GI: soft, non-tender; bowel sounds normal; no masses,  no organomegaly Extremities: extremities normal, atraumatic, no cyanosis or edema  ECOG PERFORMANCE STATUS: 1 - Symptomatic but completely ambulatory  Blood pressure (!) 147/68, pulse 77, temperature 98.2 F (36.8 C), temperature source Temporal, resp. rate 17, height 5' 4 (1.626 m), weight 138 lb (62.6 kg), SpO2 100%.  LABORATORY DATA: Lab Results  Component Value Date   WBC 6.9 06/30/2024   HGB 12.0 06/30/2024   HCT 37.0 06/30/2024    MCV 87.5 06/30/2024   PLT 192 06/30/2024      Chemistry      Component Value Date/Time   NA 143 06/09/2024 1216   NA 140 03/04/2021 1516   K 3.7 06/09/2024 1216   CL 107 06/09/2024 1216   CO2 27 06/09/2024 1216   BUN 17 06/09/2024 1216   BUN 21 03/04/2021 1516   CREATININE 1.43 (H) 06/09/2024 1216      Component Value Date/Time   CALCIUM  9.2 06/09/2024 1216   ALKPHOS 59 06/09/2024 1216   AST 28 06/09/2024 1216   ALT 31 06/09/2024 1216   BILITOT 0.2 06/09/2024 1216       RADIOGRAPHIC STUDIES: CT CHEST ABDOMEN PELVIS W CONTRAST Result Date: 06/05/2024 CLINICAL DATA:  Restaging metastatic adenocarcinoma of the right lung. * Tracking Code: BO * EXAM: CT CHEST, ABDOMEN, AND PELVIS WITH CONTRAST TECHNIQUE: Multidetector CT imaging of the chest, abdomen and pelvis was performed following the standard protocol during bolus administration of intravenous contrast. RADIATION DOSE REDUCTION: This exam was performed according to the departmental dose-optimization program which includes automated exposure control, adjustment of the mA and/or kV according to patient size and/or use of iterative reconstruction technique. CONTRAST:  80mL OMNIPAQUE  IOHEXOL  300 MG/ML  SOLN COMPARISON:  Multiple prior imaging studies. The most recent CT scan is 03/17/2024 FINDINGS: CT CHEST FINDINGS Cardiovascular: The heart is normal in size. No pericardial effusion. The aorta is normal in caliber. Stable mild tortuosity and scattered atherosclerotic calcifications. No dissection. Stable three-vessel coronary artery calcifications. The pulmonary arteries are grossly normal. Mediastinum/Nodes: No mediastinal or hilar lymphadenopathy. Stable matted soft tissue density in the right hilum and right mediastinum consistent with treated disease. No findings suspicious for recurrent tumor. The esophagus is grossly normal. The thyroid  gland is unremarkable. Inconsequential small nodules are stable. Lungs/Pleura: Stable extensive  post treatment changes involving the right lung with volume loss, dense radiation fibrosis and chronic pleural thickening and pleural fluid. No new worrisome findings to suggest recurrent tumor. No new pulmonary nodules to suggest pulmonary metastatic disease.  Small scattered sub 4 mm nodules are stable. Stable left lower lobe calcified granuloma. Musculoskeletal: No breast masses, supraclavicular or axillary adenopathy. The bony thorax is intact. Stable mild compression deformities T6 and T7. CT ABDOMEN PELVIS FINDINGS Hepatobiliary: No focal liver abnormality is seen. No gallstones, gallbladder wall thickening, or biliary dilatation. Pancreas: Choose 1 Spleen: Normal in size without focal abnormality. Adrenals/Urinary Tract: Adrenal glands are normal. Small stable renal cysts. No worrisome renal lesions. Areas of renal cortical scarring are stable. No hydronephrosis. The bladder is unremarkable. Stomach/Bowel: The stomach, duodenum, small bowel and colon are grossly normal without oral contrast. No inflammatory changes, mass lesions or obstructive findings. Scattered colonic diverticulosis and moderate stool burden noted. Vascular/Lymphatic: Stable atherosclerotic calcifications involving the aorta, iliac arteries and branch vessels but no aneurysm or dissection. The major venous structures are patent. No mesenteric or retroperitoneal lymphadenopathy. Reproductive: Surgically absent. Other: No pelvic mass or adenopathy. No free pelvic fluid collections. No inguinal mass or adenopathy. No abdominal wall hernia or subcutaneous lesions. Musculoskeletal: No significant bony findings. IMPRESSION: 1. Stable extensive post treatment changes involving the right lung with volume loss, dense radiation fibrosis and chronic pleural thickening and pleural fluid. No findings suspicious for recurrent tumor. 2. Stable matted soft tissue density in the right hilum and right mediastinum consistent with treated disease. 3. No  findings for abdominal/pelvic metastatic disease. 4. Stable atherosclerotic calcifications involving the thoracic and abdominal aorta and branch vessels including the coronary arteries. 5. Aortic atherosclerosis. Aortic Atherosclerosis (ICD10-I70.0). Electronically Signed   By: MYRTIS Stammer M.D.   On: 06/05/2024 15:04    ASSESSMENT AND PLAN: This is a very pleasant 74 years old never smoker African-American female recently with a stage IV non-small cell lung cancer, adenocarcinoma with positive EGFR mutation with deletion in exon 19. The patient was started on treatment with Tagrisso  80 mg p.o. daily status post 35 months of treatment. She has been tolerating this treatment well with no concerning adverse effects except for intermittent diarrhea. She had repeat CT scan of the chest, abdomen pelvis performed recently.  I personally and independently reviewed the scans and discussed the results with the patient and her boyfriend today. Unfortunately the CT scan showed interval progression of the right apical lung mass in addition to progression of mediastinal lymphadenopathy concerning for worsening of her disease. She has no actionable resistant mutation on the molecular studies performed by [facility]. The patient continued her current treatment with Tagrisso  and tolerating it fairly well. 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 Shannon completed January 05, 2021. The patient had significant opacities in her lung that was initially thought to be secondary to radiation treatment versus Tagrisso  induced pneumonitis versus lymphangitic spread of the tumor.  She was treated with high-dose taper regiment of prednisone  Repeat imaging studies after the palliative radiotherapy showed evidence for disease progression. Her molecular studies by [facility] recently showed no new resistant mutation.  After  discussion of her treatment options including palliative care and hospice referral versus palliative systemic chemotherapy the patient was interested in proceeding palliative systemic chemotherapy.  She started  palliative systemic chemotherapy with carboplatin  for AUC of 5 and Alimta  500 Mg/M2 every 3 weeks.  Status post 26 cycles.  Starting from cycle #7 the patient is on treatment with single agent Alimta  every 3 weeks. I did not add a Avastin to her treatment because of the recent stroke.  She also continued her treatment with Tagrisso  at the same time.  Her treatment with Alimta  was discontinued more than 12 months ago secondary to intolerance and fatigue. She has been tolerating her treatment with Tagrisso  fairly well. She resumed her treatment with maintenance Alimta  in addition to Tagrisso  again on September 11, 2023 when she had evidence for disease progression at that time. She is status post 13 cycles of treatment.   She has been tolerating her treatment well except for the night sweats recently. Assessment and Plan Assessment & Plan Stage IV non-small cell lung adenocarcinoma with EGFR mutation Metastatic EGFR-mutated lung adenocarcinoma, diagnosed July 2019, managed with Tagrisso  and maintenance Alimta . She tolerates therapy well, with predictable post-chemotherapy fatigue resolving within days. Night sweats persist, attributed to malignancy. No new symptoms or complications. Hematologic parameters remain within normal limits. She maintains good functional status without indication for treatment interruption. - Proceed with cycle 14 of maintenance Alimta  and continue Tagrisso . - Monitor for fatigue; consider treatment break if symptoms become unmanageable. - Continue routine laboratory monitoring; most recent blood counts are within normal limits. - Administered B12 injection per protocol. - Approved continued vitamin D3 and multivitamin supplementation. - Discussed mullein drops; will  further investigate safety and efficacy. - Advised mask use and infection precautions due to immunosuppression. - Scheduled follow-up in three weeks. She was advised to call immediately if she has any other concerning symptoms in the interval. The patient voices understanding of current disease status and treatment options and is in agreement with the current care plan.  All questions were answered. The patient knows to call the clinic with any problems, questions or concerns. We can certainly see the patient much sooner if necessary.  he total time spent in the appointment was 20 minutes.  Disclaimer: This note was dictated with voice recognition software. Similar sounding words can inadvertently be transcribed and may not be corrected upon review.        "

## 2024-06-30 NOTE — Telephone Encounter (Signed)
 done

## 2024-06-30 NOTE — Progress Notes (Signed)
 "    Palliative Medicine [facility]  [phone]) [phone] [phone]) [phone]   Name: Allison [name] Date: 06/30/2024 MRN: [id number]  DOB: 1950/08/16  Patient Care Team: [name], MD as PCP - General (Internal Medicine) Kate Lonni LITTIE, MD as PCP - Cardiology (Cardiology) Shannon Agent, MD as Consulting Physician (Radiation Oncology) Pickenpack-Cousar, Fannie SAILOR, NP as Nurse Practitioner (Nurse Practitioner) Evertt Lonell BROCKS, RN as Oncology Nurse Navigator (Oncology) Sherrod Sherrod, MD as Consulting Physician (Oncology) Cloria Annabella LITTIE, DO (Geriatric Medicine) Prentis Duwaine BROCKS, RN as Oncology Nurse Navigator   INTERVAL HISTORY:[name] is a 74 y.o. female with stage IV non-small cell lung cancer (12/2017), hypertension, CVA, CAD, DVT/PE (on Lovenox ), and GERD.  Palliative ask to see for symptom management and goals of care.   SOCIAL HISTORY:     reports that she has never smoked. She has been exposed to tobacco smoke. She has never used smokeless tobacco. She reports that she does not currently use alcohol. She reports that she does not use drugs.  ADVANCE DIRECTIVES:  MOST and AD on file   CODE STATUS: DNR  PAST MEDICAL HISTORY: Past Medical History:  Diagnosis Date   Anemia    Anxiety    Arthritis    Asthma    exercise induced   Coronary artery disease    Depression    PMH   Dyspnea    GERD (gastroesophageal reflux disease)    Glaucoma    History of radiation therapy 01/05/2021   IMRT right lung  11/24/2020-01/05/2021  Dr Agent Shannon   Hypertension    lung ca dx'd 11/2017   right   Lung cancer [facility]) June 2019   Malignant pleural effusion (HCC)    right   Nuclear sclerotic cataract of right eye 02/28/2021   Dr. Lonni Gaudy, cataract surgery February 2023   PONV (postoperative nausea and vomiting)    Pre-diabetes    Raynaud's disease    Raynaud's disease    Stroke (HCC) 01/2021   balance off, some express  aphasia, weakness    ALLERGIES:  is allergic to hydrocodone -acetaminophen , other, penicillins, lactose, vicodin [hydrocodone -acetaminophen ], bacid, and ipratropium-albuterol .  MEDICATIONS:  Current Outpatient Medications  Medication Sig Dispense Refill   ALPRAZolam  (XANAX ) 0.25 MG tablet Take 1 tablet (0.25 mg total) by mouth 2 (two) times daily as needed for anxiety. May take 2 tablets (0.5 mg total) at bedtime. 60 tablet 0   ALPRAZolam  (XANAX ) 0.5 MG tablet Take 1 tablet (0.5 mg total) by mouth 3 (three) times daily as needed. 90 tablet 3   amLODipine  (NORVASC ) 5 MG tablet TAKE 1 TABLET (5 MG TOTAL) BY MOUTH DAILY. 90 tablet 3   carvedilol  (COREG ) 12.5 MG tablet Take 1 tablet (12.5 mg total) by mouth 2 (two) times daily. 180 tablet 3   Cholecalciferol  (VITAMIN D3) 250 MCG (10000 UT) capsule Take 5,000 Units by mouth daily.     cycloSPORINE  (RESTASIS ) 0.05 % ophthalmic emulsion Place 1 drop into both eyes 2 (two) times daily. 180 each 3   dexamethasone  (DECADRON ) 4 MG tablet Take 1 tablet twice a day the day before, day of, and day after treatment 40 tablet 2   dorzolamide -timolol  (COSOPT ) 22.3-6.8 MG/ML ophthalmic solution Place 1 drop into both eyes 2 (two) times daily.     enoxaparin  (LOVENOX ) 100 MG/ML injection Inject 0.95 mLs (95 mg total) into the skin daily. 30 mL 2   estradiol  (ESTRACE ) 0.1 MG/GM vaginal cream Place a pea sized amount to  upper vulvar are and just inside vagina 2-3 (two to three) times a week. 42.5 g 1   ezetimibe  (ZETIA ) 10 MG tablet Take 10 mg by mouth daily.     famotidine  (PEPCID ) 20 MG tablet TAKE 1 TABLET BY MOUTH TWICE A DAY 180 tablet 1   FeFum-FePoly-FA-B Cmp-C-Biot (FOLIVANE-PLUS) CAPS Take 1 capsule by mouth in the morning. 90 capsule 0   fentaNYL  (DURAGESIC ) 12 MCG/HR Place 1 patch onto the skin every 3 (three) days. 10 patch 0   FLUoxetine  (PROZAC ) 20 MG capsule Take 1 capsule (20 mg total) by mouth daily. 90 capsule 3   folic acid  (FOLVITE ) 1 MG tablet  Take 1 tablet (1 mg total) by mouth daily. 90 tablet 1   Glycopyrrolate -Formoterol  (BEVESPI  AEROSPHERE) 9-4.8 MCG/ACT AERO Inhale 2 puffs into the lungs 2 (two) times daily. 3 each 3   HYDROcodone  bit-homatropine (HYCODAN) 5-1.5 MG/5ML syrup Take 5 mLs by mouth every 6 (six) hours as needed for cough. 240 mL 0   hyoscyamine  (LEVSIN  SL) 0.125 MG SL tablet Place 1-2 tablets (0.125-0.25mg ) under the tongue every 6 (six) hours as needed for cramping. 30 tablet 3   isosorbide  mononitrate (IMDUR ) 30 MG 24 hr tablet Take 1 tablet (30 mg total) by mouth daily. 90 tablet 3   latanoprost  (XALATAN ) 0.005 % ophthalmic solution Place 1 drop into both eyes at bedtime.      lidocaine  (LIDODERM ) 5 % Place 1 patch onto the skin daily. Remove & Discard patch within 12 hours or as directed by MD 9 patch 0   lidocaine -prilocaine  (EMLA ) cream Apply 1 Application topically as needed. 30 g 0   nitroGLYCERIN  (NITROSTAT ) 0.4 MG SL tablet Place 1 tablet (0.4 mg total) under the tongue every 5 (five) minutes as needed for chest pain. 25 tablet 3   ondansetron  (ZOFRAN -ODT) 4 MG disintegrating tablet Take 1 tablet (4 mg total) by mouth every 8 (eight) hours as needed for nausea or vomiting. 30 tablet 1   osimertinib  mesylate (TAGRISSO ) 80 MG tablet Take 1 tablet (80 mg total) by mouth daily. 30 tablet 4   oxyCODONE  (OXY IR/ROXICODONE ) 5 MG immediate release tablet Take 1-2 tablets (5-10 mg total) by mouth every 4 (four) hours as needed for severe pain (pain score 7-10) or breakthrough pain. 60 tablet 0   Perfluorohexyloctane  (MIEBO ) 1.338 GM/ML SOLN Place 1 drop into both eyes 4 (four) times daily. 3 mL 11   Perfluorohexyloctane  (MIEBO ) 1.338 GM/ML SOLN Place 1 drop into both eyes 4 (four) times daily. 9 mL 3   predniSONE  (DELTASONE ) 10 MG tablet TAKE 1 TABLET (10 MG TOTAL) BY MOUTH DAILY WITH BREAKFAST. 30 tablet 2   prochlorperazine  (COMPAZINE ) 10 MG tablet Take 1 tablet (10 mg total) by mouth every 6 (six) hours as needed. 30  tablet 3   REPATHA  SURECLICK 140 MG/ML SOAJ Inject 140 mg into the skin every 14 (fourteen) days. 6 mL 3   senna-docusate (STOOL SOFTENER/LAXATIVE) 8.6-50 MG tablet Take 2 tablets by mouth at bedtime. 60 tablet 3   Spacer/Aero-Holding Chambers (AEROCHAMBER PLUS) Device Needed to be able use inhaler properly for best results 1 each 0   triamcinolone  cream (KENALOG ) 0.1 % Apply a thin layer topically to the affected area 2 (two) times daily. 30 g 3   Wheat Dextrin (BENEFIBER DRINK MIX) PACK Take 1 packet by mouth daily.     No current facility-administered medications for this visit.   Facility-Administered Medications Ordered in Other Visits  Medication Dose Route Frequency  Provider Last Rate Last Admin   0.9 %  sodium chloride  infusion   Intravenous Continuous Sherrod Sherrod, MD 10 mL/hr at 06/30/24 1443 New Bag at 06/30/24 1443   PEMEtrexed  Disodium (ALIMTA ) 600 mg in sodium chloride  0.9 % 100 mL chemo infusion  400 mg/m2 (Treatment Plan Recorded) Intravenous Once Sherrod Sherrod, MD       Assessment NAD RRR Normal breathing pattern AAO x3  VITAL SIGNS: LMP  (LMP Unknown)  There were no vitals filed for this visit.  Estimated body mass index is 23.69 kg/m as calculated from the following:   Height as of an earlier encounter on 06/30/24: 5' 4 (1.626 m).   Weight as of an earlier encounter on 06/30/24: 138 lb (62.6 kg).   PERFORMANCE STATUS (ECOG) : 2 - Symptomatic, <50% confined to bed  IMPRESSION: Discussed the use of AI scribe software for clinical note transcription with the patient, who gave verbal consent to proceed.  History of Present Illness WHITTANY PARISH is a 74 year old female with non-small cell lung cancer who presents to clinic today for symptom management follow-up. She is accompanied by her Significant Other, [name]. No acute distress noted. Denies concerns for uncontrolled symptoms including nausea, vomiting, constipation, or diarrhea. She takes Compazine  daily  before meals to prevent nausea and requested a refill.   She reports her appetite is good, and she has been gaining weight, which she is pleased about.    She is undergoing chemotherapy and has been taking 10 mg of prednisone  daily. She had been taking dexamethasone  (4 mg) on the day before, the day of, and the day after her chemotherapy sessions, instead of prednisone . She noticed a difference in how she felt and questioned if she was managing her medications correctly. Will discuss further with oncology team.   She experiences night sweats but they are manageable. Understands per Oncologist hesitant to consider any estrogen therapies at this time.   Azaryah uses mullein leaf extract, a natural supplement, to help with chronic cough and phlegm. She places a drop under her tongue and feels it helps with chest discomfort. She continues to take Tagresso without difficulty.   We discussed her pain at length. Ms. Mauck reports pain is effectively managed with use of Fentanyl  25mcg patches. She is not requiring use of breakthrough medication, stating she has not required a dose in over 3 weeks. Much appreciative of her level of pain control. No adjustments at this time.   All questions answered and support provided.   Goals of Care Ms. Privott and family continue to remain hopeful for stability with treatment.  Her goal is to continue to treat the treatable allow her every opportunity to continue to thrive while aggressively managing her symptoms.  Her quality of life is most important to her and her family.  I discussed the importance of continued conversation with family and their medical providers regarding overall plan of care and treatment options, ensuring decisions are within the context of the patients values and GOCs. Assessment & Plan Stage 4 right lung adenocarcinoma with EGFR mutation  Cancer managed with chemotherapy. Reports good appetite and weight gain, indicating stable nutritional  status. Experiences night sweats, but no new systemic treatments introduced due to previous advice against systemic estrogen therapy. Using topical estrogen cream for local symptoms vaginally once weekly per GYN, which is deemed acceptable.  Neoplasm related pain Neoplasm-related pain managed with fentanyl  patches and oxycodone  as needed. Reports effective pain control with fentanyl  patches and  has not used oxycodone  in over a month. Mullein leaf tincture used for chest discomfort and phlegm relief, though not FDA approved. No known contraindications with current medications. - Refilled fentanyl  25mcg patches for pain management. - Continue oxycodone  as needed. - Will investigate potential interactions of mullein leaf tincture with current medications.  Chemotherapy-induced nausea and vomiting Managed with Compazine . Takes Compazine  daily and receives additional doses before chemotherapy infusions.  - Refilled Compazine  10mg  every 6 hours as needed.   Palliative care management Focus on symptom control and quality of life. Reports good appetite and weight gain, indicating effective palliative care. Uses mullein leaf tincture for chest discomfort and phlegm relief, which she finds beneficial. - Continue current palliative care regimen. -I will plan to see patient back in 4-6 weeks. Sooner if needed.   Patient expressed understanding and was in agreement with this plan. She also understands that She can call the clinic at any time with any questions, concerns, or complaints.   Any controlled substances utilized were prescribed in the context of palliative care. PDMP has been reviewed.   I personally spent a total of 30 minutes in the care of the patient today including preparing to see the patient, getting/reviewing separately obtained history, performing a medically appropriate exam/evaluation, counseling and educating, placing orders, referring and communicating with other health care  professionals, documenting clinical information in the EHR, and coordinating care. Visit consisted of counseling and education dealing with the complex and emotionally intense issues of symptom management and palliative care in the setting of serious and potentially life-threatening illness.  Levon Borer, AGPCNP-BC  Palliative Medicine Team/[facility]    "

## 2024-06-30 NOTE — Patient Instructions (Signed)
 [facility] - A DEPT OF [facility]  Discharge Instructions: Thank you for choosing [facility] to provide your oncology and hematology care.   If you have a lab appointment with the Cancer Center, please go directly to the Cancer Center and check in at the registration area.   Wear comfortable clothing and clothing appropriate for easy access to any Portacath or PICC line.   We strive to give you quality time with your provider. You may need to reschedule your appointment if you arrive late (15 or more minutes).  Arriving late affects you and other patients whose appointments are after yours.  Also, if you miss three or more appointments without notifying the office, you may be dismissed from the clinic at the provider's discretion.      For prescription refill requests, have your pharmacy contact our office and allow 72 hours for refills to be completed.    Today you received the following chemotherapy and/or immunotherapy agents Alimta      To help prevent nausea and vomiting after your treatment, we encourage you to take your nausea medication as directed.  BELOW ARE SYMPTOMS THAT SHOULD BE REPORTED IMMEDIATELY: *FEVER GREATER THAN 100.4 F (38 C) OR HIGHER *CHILLS OR SWEATING *NAUSEA AND VOMITING THAT IS NOT CONTROLLED WITH YOUR NAUSEA MEDICATION *UNUSUAL SHORTNESS OF BREATH *UNUSUAL BRUISING OR BLEEDING *URINARY PROBLEMS (pain or burning when urinating, or frequent urination) *BOWEL PROBLEMS (unusual diarrhea, constipation, pain near the anus) TENDERNESS IN MOUTH AND THROAT WITH OR WITHOUT PRESENCE OF ULCERS (sore throat, sores in mouth, or a toothache) UNUSUAL RASH, SWELLING OR PAIN  UNUSUAL VAGINAL DISCHARGE OR ITCHING   Items with * indicate a potential emergency and should be followed up as soon as possible or go to the Emergency Department if any problems should occur.  Please show the CHEMOTHERAPY ALERT CARD or IMMUNOTHERAPY  ALERT CARD at check-in to the Emergency Department and triage nurse.  Should you have questions after your visit or need to cancel or reschedule your appointment, please contact [facility] - A DEPT OF [facility]  Dept: [phone]  and follow the prompts.  Office hours are 8:00 a.m. to 4:30 p.m. Monday - Friday. Please note that voicemails left after 4:00 p.m. may not be returned until the following business day.  We are closed weekends and major holidays. You have access to a nurse at all times for urgent questions. Please call the main number to the clinic Dept: [phone] and follow the prompts.   For any non-urgent questions, you may also contact your provider using MyChart. We now offer e-Visits for anyone 79 and older to request care online for non-urgent symptoms. For details visit [email or web address].   Also download the MyChart app! Go to the app store, search "MyChart", open the app, select [place], and log in with your MyChart username and password.

## 2024-07-01 ENCOUNTER — Other Ambulatory Visit: Payer: Self-pay

## 2024-07-01 ENCOUNTER — Other Ambulatory Visit (HOSPITAL_COMMUNITY): Payer: Self-pay

## 2024-07-01 ENCOUNTER — Encounter (HOSPITAL_COMMUNITY)
Admission: RE | Admit: 2024-07-01 | Discharge: 2024-07-01 | Disposition: A | Source: Ambulatory Visit | Attending: Pulmonary Disease | Admitting: Pulmonary Disease

## 2024-07-01 DIAGNOSIS — R0609 Other forms of dyspnea: Secondary | ICD-10-CM

## 2024-07-01 MED ORDER — BEVESPI AEROSPHERE 9-4.8 MCG/ACT IN AERO: 2.0000 | INHALATION_SPRAY | Freq: Two times a day (BID) | RESPIRATORY_TRACT | 3 refills | Status: AC

## 2024-07-01 NOTE — Progress Notes (Signed)
 Daily Session Note  Patient Details  Name: Allison [name] MRN: [id number] Date of Birth: May 23, 1951 Referring Provider:   Conrad Ports Pulmonary Rehab Walk Test from 06/16/2024 in [facility]  Referring Provider Hunsucker    Encounter Date: 07/01/2024  Check In:  Session Check In - 07/01/24 1124       Check-In   Supervising physician immediately available to respond to emergencies [facility] MD immediately available    Physician(s) Rosaline Skains, NP    Location [facility]    Staff Present Ronal Levin, RN, BSN;Adewale Pucillo Claudene, RT;Randi [facility] BS, ACSM-CEP, Exercise Physiologist;Kaylee Nicholaus, MS, ACSM-CEP, Exercise Physiologist    Virtual Visit No    Medication changes reported     No    Fall or balance concerns reported    No    Tobacco Cessation No Change    Warm-up and Cool-down Performed as group-led instruction    Resistance Training Performed Yes    VAD Patient? No    PAD/SET Patient? No      Pain Assessment   Currently in Pain? No/denies    Multiple Pain Sites No          Capillary Blood Glucose: Results for orders placed or performed in visit on 06/30/24 (from the past 24 hours)  CMP (Cancer Center only)     Status: Abnormal   Collection Time: 06/30/24  1:18 PM  Result Value Ref Range   Sodium 141 135 - 145 mmol/L   Potassium 3.9 3.5 - 5.1 mmol/L   Chloride 104 98 - 111 mmol/L   CO2 26 22 - 32 mmol/L   Glucose, Bld 104 (H) 70 - 99 mg/dL   BUN 18 8 - 23 mg/dL   Creatinine 8.61 (H) 9.55 - 1.00 mg/dL   Calcium  9.5 8.9 - 10.3 mg/dL   Total Protein 6.8 6.5 - 8.1 g/dL   Albumin 4.0 3.5 - 5.0 g/dL   AST 36 15 - 41 U/L   ALT 46 (H) 0 - 44 U/L   Alkaline Phosphatase 62 38 - 126 U/L   Total Bilirubin 0.2 0.0 - 1.2 mg/dL   GFR, Estimated 40 (L) >60 mL/min   Anion gap 11 5 - 15  CBC with Differential (Cancer Center Only)     Status: None   Collection Time: 06/30/24  1:18 PM  Result Value Ref  Range   WBC Count 6.9 4.0 - 10.5 K/uL   RBC 4.23 3.87 - 5.11 MIL/uL   Hemoglobin 12.0 12.0 - 15.0 g/dL   HCT 62.9 63.9 - 53.9 %   MCV 87.5 80.0 - 100.0 fL   MCH 28.4 26.0 - 34.0 pg   MCHC 32.4 30.0 - 36.0 g/dL   RDW 85.6 88.4 - 84.4 %   Platelet Count 192 150 - 400 K/uL   nRBC 0.0 0.0 - 0.2 %   Neutrophils Relative % 72 %   Neutro Abs 4.9 1.7 - 7.7 K/uL   Lymphocytes Relative 18 %   Lymphs Abs 1.2 0.7 - 4.0 K/uL   Monocytes Relative 10 %   Monocytes Absolute 0.7 0.1 - 1.0 K/uL   Eosinophils Relative 0 %   Eosinophils Absolute 0.0 0.0 - 0.5 K/uL   Basophils Relative 0 %   Basophils Absolute 0.0 0.0 - 0.1 K/uL   Immature Granulocytes 0 %   Abs Immature Granulocytes 0.03 0.00 - 0.07 K/uL   *Note: Due to a large number of results  and/or encounters for the requested time period, some results have not been displayed. A complete set of results can be found in Results Review.      Tobacco Use History[1]  Goals Met:  Proper associated with RPD/PD & O2 Sat Independence with exercise equipment Exercise tolerated well No report of concerns or symptoms today Strength training completed today  Goals Unmet:  Not Applicable  Comments: Service time is from 1009 to 1140.    Dr. Slater Staff is Medical Director for Pulmonary Rehab at [facility].     [1]  Social History Tobacco Use  Smoking Status Never   Passive exposure: Past  Smokeless Tobacco Never

## 2024-07-03 ENCOUNTER — Encounter (HOSPITAL_COMMUNITY)

## 2024-07-04 ENCOUNTER — Other Ambulatory Visit (HOSPITAL_COMMUNITY): Payer: Self-pay

## 2024-07-08 ENCOUNTER — Encounter (HOSPITAL_COMMUNITY)
Admission: RE | Admit: 2024-07-08 | Discharge: 2024-07-08 | Disposition: A | Source: Ambulatory Visit | Attending: Pulmonary Disease | Admitting: Pulmonary Disease

## 2024-07-08 VITALS — Wt 137.3 lb

## 2024-07-08 DIAGNOSIS — R0609 Other forms of dyspnea: Secondary | ICD-10-CM

## 2024-07-08 NOTE — Progress Notes (Signed)
 Daily Session Note  Patient Details  Name: Allison [name] MRN: [id number] Date of Birth: 10-Feb-1951 Referring Provider:   Conrad Ports Pulmonary Rehab Walk Test from 06/16/2024 in [facility]  Referring Provider Hunsucker    Encounter Date: 07/08/2024  Check In:  Session Check In - 07/08/24 1029       Check-In   Supervising physician immediately available to respond to emergencies [facility] MD immediately available    Physician(s) Lum Louis, NP    Location [facility]    Staff Present Ronal Levin, RN, BSN;Casey Claudene, RT;Randi Midge BS, ACSM-CEP, Exercise Physiologist;Abron Neddo Nicholaus, MS, ACSM-CEP, Exercise Physiologist;Other    Virtual Visit No    Medication changes reported     No    Fall or balance concerns reported    No    Tobacco Cessation No Change    Warm-up and Cool-down Performed as group-led instruction    Resistance Training Performed Yes    VAD Patient? No    PAD/SET Patient? No      Pain Assessment   Currently in Pain? No/denies          Capillary Blood Glucose: No results found. However, due to the size of the patient record, not all encounters were searched. Please check Results Review for a complete set of results.   Exercise Prescription Changes - 07/08/24 1100       Response to Exercise   Blood Pressure (Admit) 108/62    Blood Pressure (Exercise) 106/58    Blood Pressure (Exit) 112/58    Heart Rate (Admit) 72 bpm    Heart Rate (Exercise) 82 bpm    Heart Rate (Exit) 72 bpm    Oxygen Saturation (Admit) 100 %    Oxygen Saturation (Exercise) 97 %    Oxygen Saturation (Exit) 100 %    Rating of Perceived Exertion (Exercise) 13    Perceived Dyspnea (Exercise) 2    Duration Continue with 30 min of aerobic exercise without signs/symptoms of physical distress.    Intensity THRR unchanged      Progression   Progression Continue to progress workloads to maintain intensity  without signs/symptoms of physical distress.      Resistance Training   Weight red bands    Reps 10-15    Time 10 Minutes      NuStep   Level 1    SPM 61    Minutes 15    METs 1.6      Track   Laps 7    Minutes 15    METs 2.08          Tobacco Use History[1]  Goals Met:  Proper associated with RPD/PD & O2 Sat Independence with exercise equipment Exercise tolerated well No report of concerns or symptoms today Strength training completed today  Goals Unmet:  Not Applicable  Comments: Service time is from 1012 to 1134.  Dr. Slater Staff is Medical Director for Pulmonary Rehab at [facility].     [1]  Social History Tobacco Use  Smoking Status Never   Passive exposure: Past  Smokeless Tobacco Never

## 2024-07-09 NOTE — Progress Notes (Signed)
 Pulmonary Individual Treatment Plan  Patient Details  Name: Allison [name] MRN: [id number] Date of Birth: Sep 26, 1950 Referring Provider:   Conrad Ports Pulmonary Rehab Walk Test from 06/16/2024 in [facility], [facility], [facility]  Referring Provider Hunsucker    Initial Encounter Date:  Flowsheet Row Pulmonary Rehab Walk Test from 06/16/2024 in [facility] for Heart, Vascular, & Lung Health  Date 06/16/24    Visit Diagnosis: Dyspnea on exertion  Patient's Home Medications on Admission:  Current Medications[1]  Past Medical History: Past Medical History:  Diagnosis Date   Anemia    Anxiety    Arthritis    Asthma    exercise induced   Coronary artery disease    Depression    PMH   Dyspnea    GERD (gastroesophageal reflux disease)    Glaucoma    History of radiation therapy 01/05/2021   IMRT right lung  11/24/2020-01/05/2021  Dr Lynwood Nasuti   Hypertension    lung ca dx'd 11/2017   right   Lung cancer [facility]) June 2019   Malignant pleural effusion [facility])    right   Nuclear sclerotic cataract of right eye 02/28/2021   Dr. Lonni Gaudy, cataract surgery February 2023   PONV (postoperative nausea and vomiting)    Pre-diabetes    Raynaud's disease    Raynaud's disease    Stroke (HCC) 01/2021   balance off, some express aphasia, weakness    Tobacco Use: Tobacco Use History[2]  Labs: Review Flowsheet  More data exists      Latest Ref Rng & Units 11/01/2020 02/07/2021 02/12/2021 04/21/2021 10/02/2023  Labs for ITP Cardiac and Pulmonary Rehab  Cholestrol 100 - 199 mg/dL 846  - 824  - 850   LDL (calc) 0 - 99 mg/dL 74  - 91  - 77   HDL-C >39 mg/dL 64  - 62  - 60   Trlycerides 0 - 149 mg/dL 77  - 889  - 60   Hemoglobin A1c 4.8 - 5.6 % - - 5.7  - -  Bicarbonate 20.0 - 28.0 mmol/L - 23.4  - - -  TCO2 22 - 32 mmol/L - - - 26  -  Acid-base deficit 0.0 - 2.0 mmol/L - 0.2  - - -  O2 Saturation % - 76.1  - - -     Capillary Blood Glucose: Lab Results  Component Value Date   GLUCAP 109 (H) 08/13/2023   GLUCAP 94 04/21/2021   GLUCAP 88 11/09/2020   GLUCAP 87 12/26/2017     Pulmonary Assessment Scores:  Pulmonary Assessment Scores     Row Name 06/16/24 1126         ADL UCSD   ADL Phase Entry     SOB Score total 54       CAT Score   CAT Score 22       mMRC Score   mMRC Score 3       UCSD: Self-administered rating of dyspnea associated with activities of daily living (ADLs) 6-point scale (0 = not at all to 5 = maximal or unable to do because of breathlessness)  Scoring Scores range from 0 to 120.  Minimally important difference is 5 units  CAT: CAT can identify the health impairment of COPD patients and is better correlated with disease progression.  CAT has a scoring range of zero to 40. The CAT score is classified into four groups of low (less than 10),  medium (10 - 20), high (21-30) and very high (31-40) based on the impact level of disease on health status. A CAT score over 10 suggests significant symptoms.  A worsening CAT score could be explained by an exacerbation, poor medication adherence, poor inhaler technique, or progression of COPD or comorbid conditions.  CAT MCID is 2 points  mMRC: mMRC (Modified Medical Research Council) Dyspnea Scale is used to assess the degree of baseline functional disability in patients of respiratory disease due to dyspnea. No minimal important difference is established. A decrease in score of 1 point or greater is considered a positive change.   Pulmonary Function Assessment:  Pulmonary Function Assessment - 06/16/24 1045       Breath   Bilateral Breath Sounds Decreased;Rhonchi    Shortness of Breath Yes;Limiting activity          Exercise Target Goals: Exercise Program Goal: Individual exercise prescription set using results from initial 6 min walk test and THRR while considering  patients activity barriers and safety.    Exercise Prescription Goal: Initial exercise prescription builds to 30-45 minutes a day of aerobic activity, 2-3 days per week.  Home exercise guidelines will be given to patient during program as part of exercise prescription that the participant will acknowledge.  Activity Barriers & Risk Stratification:  Activity Barriers & Cardiac Risk Stratification - 06/16/24 1045       Activity Barriers & Cardiac Risk Stratification   Activity Barriers Muscular Weakness;Deconditioning;Shortness of Breath;Assistive Device;Balance Concerns          6 Minute Walk:  6 Minute Walk     Row Name 06/16/24 1130         6 Minute Walk   Phase Initial     Distance 850 feet     Walk Time 6 minutes     # of Rest Breaks 0     MPH 1.61     METS 2.12     RPE 13     Perceived Dyspnea  2     VO2 Peak 7.41     Symptoms No     Resting HR 72 bpm     Resting BP 134/70     Resting Oxygen Saturation  99 %     Exercise Oxygen Saturation  during 6 min walk 96 %     Max Ex. HR 93 bpm     Max Ex. BP 144/70     2 Minute Post BP 130/70       Interval HR   1 Minute HR 79     2 Minute HR 92     3 Minute HR 92     4 Minute HR 93     5 Minute HR 91     6 Minute HR 93     2 Minute Post HR 74     Interval Heart Rate? Yes       Interval Oxygen   Interval Oxygen? Yes     Baseline Oxygen Saturation % 99 %     1 Minute Oxygen Saturation % 99 %     1 Minute Liters of Oxygen 0 L     2 Minute Oxygen Saturation % 96 %     2 Minute Liters of Oxygen 0 L     3 Minute Oxygen Saturation % 98 %     3 Minute Liters of Oxygen 0 L     4 Minute Oxygen Saturation % 98 %     4 Minute  Liters of Oxygen 0 L     5 Minute Oxygen Saturation % 99 %     5 Minute Liters of Oxygen 0 L     6 Minute Oxygen Saturation % 98 %     6 Minute Liters of Oxygen 0 L     2 Minute Post Oxygen Saturation % 100 %     2 Minute Post Liters of Oxygen 0 L        Oxygen Initial Assessment:  Oxygen Initial Assessment - 06/16/24 1045        Home Oxygen   Home Oxygen Device None    Sleep Oxygen Prescription None    Home Exercise Oxygen Prescription None    Home Resting Oxygen Prescription None      Initial 6 min Walk   Oxygen Used None      Program Oxygen Prescription   Program Oxygen Prescription None      Intervention   Short Term Goals To learn and understand importance of monitoring SPO2 with pulse oximeter and demonstrate accurate use of the pulse oximeter.;To learn and understand importance of maintaining oxygen saturations>88%;To learn and demonstrate proper pursed lip breathing techniques or other breathing techniques. ;To learn and demonstrate proper use of respiratory medications    Long  Term Goals Verbalizes importance of monitoring SPO2 with pulse oximeter and return demonstration;Maintenance of O2 saturations>88%;Exhibits proper breathing techniques, such as pursed lip breathing or other method taught during program session;Compliance with respiratory medication;Demonstrates proper use of MDIs          Oxygen Re-Evaluation:  Oxygen Re-Evaluation     Row Name 06/30/24 0948             Program Oxygen Prescription   Program Oxygen Prescription None         Home Oxygen   Home Oxygen Device None       Sleep Oxygen Prescription None       Home Exercise Oxygen Prescription None       Home Resting Oxygen Prescription None         Goals/Expected Outcomes   Short Term Goals To learn and understand importance of monitoring SPO2 with pulse oximeter and demonstrate accurate use of the pulse oximeter.;To learn and understand importance of maintaining oxygen saturations>88%;To learn and demonstrate proper pursed lip breathing techniques or other breathing techniques. ;To learn and demonstrate proper use of respiratory medications       Long  Term Goals Verbalizes importance of monitoring SPO2 with pulse oximeter and return demonstration;Maintenance of O2 saturations>88%;Exhibits proper breathing techniques,  such as pursed lip breathing or other method taught during program session;Compliance with respiratory medication;Demonstrates proper use of MDIs          Oxygen Discharge (Final Oxygen Re-Evaluation):  Oxygen Re-Evaluation - 06/30/24 0948       Program Oxygen Prescription   Program Oxygen Prescription None      Home Oxygen   Home Oxygen Device None    Sleep Oxygen Prescription None    Home Exercise Oxygen Prescription None    Home Resting Oxygen Prescription None      Goals/Expected Outcomes   Short Term Goals To learn and understand importance of monitoring SPO2 with pulse oximeter and demonstrate accurate use of the pulse oximeter.;To learn and understand importance of maintaining oxygen saturations>88%;To learn and demonstrate proper pursed lip breathing techniques or other breathing techniques. ;To learn and demonstrate proper use of respiratory medications    Long  Term Goals Verbalizes importance of  monitoring SPO2 with pulse oximeter and return demonstration;Maintenance of O2 saturations>88%;Exhibits proper breathing techniques, such as pursed lip breathing or other method taught during program session;Compliance with respiratory medication;Demonstrates proper use of MDIs          Initial Exercise Prescription:  Initial Exercise Prescription - 06/16/24 1100       Date of Initial Exercise RX and Referring Provider   Date 06/16/24    Referring Provider Hunsucker    Expected Discharge Date 09/12/23      NuStep   Level 1    SPM 60    Minutes 15    METs 1.8      Track   Minutes 15    METs 2      Prescription Details   Frequency (times per week) 2    Duration Progress to 30 minutes of continuous aerobic without signs/symptoms of physical distress      Intensity   THRR 40-80% of Max Heartrate 59-118    Ratings of Perceived Exertion 11-13    Perceived Dyspnea 0-4      Progression   Progression Continue to progress workloads to maintain intensity without  signs/symptoms of physical distress.      Resistance Training   Training Prescription Yes    Weight red bands    Reps 10-15          Perform Capillary Blood Glucose checks as needed.  Exercise Prescription Changes:   Exercise Prescription Changes     Row Name 06/24/24 1100 07/08/24 1100           Response to Exercise   Blood Pressure (Admit) 126/64 108/62      Blood Pressure (Exercise) 152/70 106/58      Blood Pressure (Exit) 112/66 112/58      Heart Rate (Admit) 82 bpm 72 bpm      Heart Rate (Exercise) 93 bpm 82 bpm      Heart Rate (Exit) 77 bpm 72 bpm      Oxygen Saturation (Admit) 100 % 100 %      Oxygen Saturation (Exercise) 100 % 97 %      Oxygen Saturation (Exit) 100 % 100 %      Rating of Perceived Exertion (Exercise) 13 13      Perceived Dyspnea (Exercise) 1 2      Duration Continue with 30 min of aerobic exercise without signs/symptoms of physical distress. Continue with 30 min of aerobic exercise without signs/symptoms of physical distress.      Intensity THRR unchanged THRR unchanged        Progression   Progression Continue to progress workloads to maintain intensity without signs/symptoms of physical distress. Continue to progress workloads to maintain intensity without signs/symptoms of physical distress.        Resistance Training   Weight red bands red bands      Reps 10-15 10-15      Time 10 Minutes 10 Minutes        NuStep   Level 1 1      SPM 39 61      Minutes 15 15      METs 1.3 1.6        Track   Laps 7 7      Minutes 15 15      METs 2.08 2.08         Exercise Comments:   Exercise Goals and Review:   Exercise Goals     Row Name 06/16/24 1045  Exercise Goals   Increase Physical Activity Yes       Intervention Provide advice, education, support and counseling about physical activity/exercise needs.;Develop an individualized exercise prescription for aerobic and resistive training based on initial evaluation  findings, risk stratification, comorbidities and participant's personal goals.       Expected Outcomes Short Term: Attend rehab on a regular basis to increase amount of physical activity.;Long Term: Add in home exercise to make exercise part of routine and to increase amount of physical activity.;Long Term: Exercising regularly at least 3-5 days a week.       Increase Strength and Stamina Yes       Intervention Provide advice, education, support and counseling about physical activity/exercise needs.;Develop an individualized exercise prescription for aerobic and resistive training based on initial evaluation findings, risk stratification, comorbidities and participant's personal goals.       Expected Outcomes Short Term: Increase workloads from initial exercise prescription for resistance, speed, and METs.;Short Term: Perform resistance training exercises routinely during rehab and add in resistance training at home;Long Term: Improve cardiorespiratory fitness, muscular endurance and strength as measured by increased METs and functional capacity ( )       Able to understand and use rate of perceived exertion (RPE) scale Yes       Intervention Provide education and explanation on how to use RPE scale       Expected Outcomes Short Term: Able to use RPE daily in rehab to express subjective intensity level;Long Term:  Able to use RPE to guide intensity level when exercising independently       Able to understand and use Dyspnea scale Yes       Intervention Provide education and explanation on how to use Dyspnea scale       Expected Outcomes Short Term: Able to use Dyspnea scale daily in rehab to express subjective sense of shortness of breath during exertion;Long Term: Able to use Dyspnea scale to guide intensity level when exercising independently       Knowledge and understanding of Target Heart Rate Range (THRR) Yes       Intervention Provide education and explanation of THRR including how the numbers  were predicted and where they are located for reference       Expected Outcomes Short Term: Able to state/look up THRR;Long Term: Able to use THRR to govern intensity when exercising independently;Short Term: Able to use daily as guideline for intensity in rehab       Understanding of Exercise Prescription Yes       Intervention Provide education, explanation, and written materials on patient's individual exercise prescription       Expected Outcomes Short Term: Able to explain program exercise prescription;Long Term: Able to explain home exercise prescription to exercise independently          Exercise Goals Re-Evaluation :  Exercise Goals Re-Evaluation     Row Name 06/30/24 0945             Exercise Goal Re-Evaluation   Exercise Goals Review Increase Physical Activity;Able to understand and use Dyspnea scale;Understanding of Exercise Prescription;Increase Strength and Stamina;Knowledge and understanding of Target Heart Rate Range (THRR);Able to understand and use rate of perceived exertion (RPE) scale       Comments Farhiya has completed 2 exercise exercise sessions. She exercises for 15 min on the Nustep and track. She averages 1.7 METs at level 1 on the Nustep and 2.38 METs on the track. She performs the warmup and cooldown standing  without limitations. It is too soon to notate any discernable progressions. Will continue to monitor and progress as able.       Expected Outcomes Through exercise at rehab and home, the patient will decrease shortness of breath with daily activities and feel confident in carrying out an exercise regimen at home.          Discharge Exercise Prescription (Final Exercise Prescription Changes):  Exercise Prescription Changes - 07/08/24 1100       Response to Exercise   Blood Pressure (Admit) 108/62    Blood Pressure (Exercise) 106/58    Blood Pressure (Exit) 112/58    Heart Rate (Admit) 72 bpm    Heart Rate (Exercise) 82 bpm    Heart Rate (Exit) 72 bpm     Oxygen Saturation (Admit) 100 %    Oxygen Saturation (Exercise) 97 %    Oxygen Saturation (Exit) 100 %    Rating of Perceived Exertion (Exercise) 13    Perceived Dyspnea (Exercise) 2    Duration Continue with 30 min of aerobic exercise without signs/symptoms of physical distress.    Intensity THRR unchanged      Progression   Progression Continue to progress workloads to maintain intensity without signs/symptoms of physical distress.      Resistance Training   Weight red bands    Reps 10-15    Time 10 Minutes      NuStep   Level 1    SPM 61    Minutes 15    METs 1.6      Track   Laps 7    Minutes 15    METs 2.08          Nutrition:  Target Goals: Understanding of nutrition guidelines, daily intake of sodium 1500mg , cholesterol 200mg , calories 30% from fat and 7% or less from saturated fats, daily to have 5 or more servings of fruits and vegetables.  Biometrics:    Nutrition Therapy Plan and Nutrition Goals:  Nutrition Therapy & Goals - 06/24/24 1107       Nutrition Therapy   Diet General Healthy Diet      Personal Nutrition Goals   Nutrition Goal Patient to identify strategies for weight maintenance during pulmonary rehab.    Comments Patient with medical history significant for stage IV non-small cell lung cancer; currently receiving chemotherapy every 3 weeks. Pt reports intentionally gaining weight due to prior weight loss related to cancer diagnosis. Significant wt gain of 10.6% noted over past 6 months. Pt endorses consuming meals with good sources of protein such as chicken, lentils and eggs. Reports good appetite; though intake sometimes impacted by taste changes. RD provided suggestions for improving taste such as adding fruit or vinegar-based sauce/dressing to foods. Pt may also discuss possibly using miracle berry tablets with oncology pharmacist.      Intervention Plan   Intervention Prescribe, educate and counsel regarding individualized specific  dietary modifications aiming towards targeted core components such as weight, hypertension, lipid management, diabetes, heart failure and other comorbidities.    Expected Outcomes Short Term Goal: Understand basic principles of dietary content, such as calories, fat, sodium, cholesterol and nutrients.;Long Term Goal: Adherence to prescribed nutrition plan.          Nutrition Assessments:  MEDIFICTS Score Key: >=70 Need to make dietary changes  40-70 Heart Healthy Diet <= 40 Therapeutic Level Cholesterol Diet  Flowsheet Row PULMONARY REHAB OTHER RESPIRATORY from 07/08/2024 in [facility] for Heart, Vascular, & Lung Health  Picture Your Plate Total Score on Admission 56   Picture Your Plate Scores: <59 Unhealthy dietary pattern with much room for improvement. 41-50 Dietary pattern unlikely to meet recommendations for good health and room for improvement. 51-60 More healthful dietary pattern, with some room for improvement.  >60 Healthy dietary pattern, although there may be some specific behaviors that could be improved.    Nutrition Goals Re-Evaluation:   Nutrition Goals Discharge (Final Nutrition Goals Re-Evaluation):   Psychosocial: Target Goals: Acknowledge presence or absence of significant depression and/or stress, maximize coping skills, provide positive support system. Participant is able to verbalize types and ability to use techniques and skills needed for reducing stress and depression.  Initial Review & Psychosocial Screening:  Initial Psych Review & Screening - 06/16/24 1043       Initial Review   Current issues with Current Anxiety/Panic      Family Dynamics   Good Support System? Yes    Comments Patient is on medication for anxiety.      Barriers   Psychosocial barriers to participate in program There are no identifiable barriers or psychosocial needs.      Screening Interventions   Interventions Encouraged to exercise           Quality of Life Scores:  Scores of 19 and below usually indicate a poorer quality of life in these areas.  A difference of  2-3 points is a clinically meaningful difference.  A difference of 2-3 points in the total score of the Quality of Life Index has been associated with significant improvement in overall quality of life, self-image, physical symptoms, and general health in studies assessing change in quality of life.  PHQ-9: Review Flowsheet  More data exists      06/16/2024 06/09/2024 05/13/2024 04/22/2024 03/24/2024  Depression screen PHQ 2/9  Decreased Interest 0 0 0 0 0 0  Down, Depressed, Hopeless 0 0 0 0 0 0  PHQ - 2 Score 0 0 0 0 0 0  Altered sleeping 1 - - 0 -  Tired, decreased energy 1 - - 1 -  Change in appetite 0 - - 0 -  Feeling bad or failure about yourself  0 - - 0 -  Trouble concentrating 0 - - 1 -  Moving slowly or fidgety/restless 0 - - 0 -  Suicidal thoughts 0 - - 0 -  PHQ-9 Score 2 - - 2  -  Difficult doing work/chores Somewhat difficult - - - -    Details       Data saved with a previous flowsheet row definition   Multiple values from one day are sorted in reverse-chronological order        Interpretation of Total Score  Total Score Depression Severity:  1-4 = Minimal depression, 5-9 = Mild depression, 10-14 = Moderate depression, 15-19 = Moderately severe depression, 20-27 = Severe depression   Psychosocial Evaluation and Intervention:  Psychosocial Evaluation - 06/16/24 1044       Psychosocial Evaluation & Interventions   Interventions Encouraged to exercise with the program and follow exercise prescription    Comments pt denies psychosocial barriers    Expected Outcomes Pt will benefit from PR without any psychosocial barriers or concerns    Continue Psychosocial Services  No Follow up required          Psychosocial Re-Evaluation:  Psychosocial Re-Evaluation     Row Name 06/27/24 1414             Psychosocial Re-Evaluation  Current issues with Current Anxiety/Panic       Comments 30 day psy/soc re-eval as follows: Flynn has attended 2 sessions so far. She denies any new psy/soc barriers or concerns at this time. She has good support from her friends and family. We will continue to support Alfreda as needed throughout her time in [place].       Expected Outcomes For Tamiya to continue PR without any psychosocial barriers or concerns.       Interventions Encouraged to attend Pulmonary Rehabilitation for the exercise       Continue Psychosocial Services  No Follow up required          Psychosocial Discharge (Final Psychosocial Re-Evaluation):  Psychosocial Re-Evaluation - 06/27/24 1414       Psychosocial Re-Evaluation   Current issues with Current Anxiety/Panic    Comments 30 day psy/soc re-eval as follows: Selisa has attended 2 sessions so far. She denies any new psy/soc barriers or concerns at this time. She has good support from her friends and family. We will continue to support Galena as needed throughout her time in [place].    Expected Outcomes For Margy to continue PR without any psychosocial barriers or concerns.    Interventions Encouraged to attend Pulmonary Rehabilitation for the exercise    Continue Psychosocial Services  No Follow up required          Education: Education Goals: Education classes will be provided on a weekly basis, covering required topics. Participant will state understanding/return demonstration of topics presented.  Learning Barriers/Preferences:  Learning Barriers/Preferences - 06/16/24 1044       Learning Barriers/Preferences   Learning Barriers Sight    Learning Preferences Skilled Demonstration;Pictoral;Written Material          Education Topics: Know Your Numbers Group instruction that is supported by a PowerPoint presentation. Instructor discusses importance of knowing and understanding resting, exercise, and post-exercise oxygen saturation, heart rate, and blood pressure.  Oxygen saturation, heart rate, blood pressure, rating of perceived exertion, and dyspnea are reviewed along with a normal range for these values.    Exercise for the Pulmonary Patient Group instruction that is supported by a PowerPoint presentation. Instructor discusses benefits of exercise, core components of exercise, frequency, duration, and intensity of an exercise routine, importance of utilizing pulse oximetry during exercise, safety while exercising, and options of places to exercise outside of rehab.    MET Level  Group instruction provided by PowerPoint, verbal discussion, and written material to support subject matter. Instructor reviews what METs are and how to increase METs.  Flowsheet Row PULMONARY REHAB OTHER RESPIRATORY from 07/18/2022 in [facility] for Heart, Vascular, & Lung Health  Date 07/18/22  Educator EP  Instruction Review Code 1- Verbalizes Understanding    Pulmonary Medications Verbally interactive group education provided by instructor with focus on inhaled medications and proper administration. Flowsheet Row PULMONARY REHAB OTHER RESPIRATORY from 09/14/2022 in [facility] for Heart, Vascular, & [facility]  Date 09/14/22  Educator RT  Instruction Review Code 1- Verbalizes Understanding    Anatomy and Physiology of the Respiratory System Group instruction provided by PowerPoint, verbal discussion, and written material to support subject matter. Instructor reviews respiratory cycle and anatomical components of the respiratory system and their functions. Instructor also reviews differences in obstructive and restrictive respiratory diseases with examples of each.  Flowsheet Row PULMONARY REHAB OTHER RESPIRATORY from 08/24/2022 in [facility], [facility] & [facility]  Date 08/24/22  Educator EP  Instruction Review Code 1- Verbalizes Understanding    Oxygen Safety Group instruction  provided by PowerPoint, verbal discussion, and written material to support subject matter. There is an overview of What is Oxygen and Why do we need it.  Instructor also reviews how to create a safe environment for oxygen use, the importance of using oxygen as prescribed, and the risks of noncompliance. There is a brief discussion on traveling with oxygen and resources the patient may utilize.   Oxygen Use Group instruction provided by PowerPoint, verbal discussion, and written material to discuss how supplemental oxygen is prescribed and different types of oxygen supply systems. Resources for more information are provided.  Flowsheet Row PULMONARY REHAB OTHER RESPIRATORY from 06/26/2024 in [facility] for Heart, Vascular, & Lung Health  Date 06/26/24  Educator RN  Instruction Review Code 1- Verbalizes Understanding    Breathing Techniques Group instruction that is supported by demonstration and informational handouts. Instructor discusses the benefits of pursed lip and diaphragmatic breathing and detailed demonstration on how to perform both.  Flowsheet Row PULMONARY REHAB OTHER RESPIRATORY from 07/27/2022 in [facility] for Heart, Vascular, & Lung [facility]  Date 07/27/22  Educator EP  Instruction Review Code 1- Verbalizes Understanding     Risk Factor Reduction Group instruction that is supported by a PowerPoint presentation. Instructor discusses the definition of a risk factor, different risk factors for pulmonary disease, and how the heart and lungs work together.   Pulmonary Diseases Group instruction provided by PowerPoint, verbal discussion, and written material to support subject matter. Instructor gives an overview of the different type of pulmonary diseases. There is also a discussion on risk factors and symptoms as well as ways to manage the diseases.   Stress and Energy Conservation Group instruction provided by PowerPoint, verbal  discussion, and written material to support subject matter. Instructor gives an overview of stress and the impact it can have on the body. Instructor also reviews ways to reduce stress. There is also a discussion on energy conservation and ways to conserve energy throughout the day.   Warning Signs and Symptoms Group instruction provided by PowerPoint, verbal discussion, and written material to support subject matter. Instructor reviews warning signs and symptoms of stroke, heart attack, cold and flu. Instructor also reviews ways to prevent the spread of infection.   Other Education Group or individual verbal, written, or video instructions that support the educational goals of the pulmonary rehab program. Flowsheet Row PULMONARY REHAB OTHER RESPIRATORY from 08/10/2022 in [facility] for Heart, Vascular, & Lung Health  Date 08/10/22  Educator RT  Instruction Review Code 1- Verbalizes Understanding     Knowledge Questionnaire Score:  Knowledge Questionnaire Score - 06/16/24 1126       Knowledge Questionnaire Score   Pre Score 16/18          Core Components/Risk Factors/Patient Goals at Admission:  Personal Goals and Risk Factors at Admission - 06/16/24 1044       Core Components/Risk Factors/Patient Goals on Admission    Weight Management Yes;Weight Gain    Intervention Weight Management: Develop a combined nutrition and exercise program designed to reach desired caloric intake, while maintaining appropriate intake of nutrient and fiber, sodium and fats, and appropriate energy expenditure required for the weight goal.;Weight Management: Provide education and appropriate resources to help participant work on and attain dietary goals.    Expected Outcomes Short Term: Continue to assess  and modify interventions until short term weight is achieved;Long Term: Adherence to nutrition and physical activity/exercise program aimed toward attainment of established weight  goal;Weight Maintenance: Understanding of the daily nutrition guidelines, which includes 25-35% calories from fat, 7% or less cal from saturated fats, less than 200mg  cholesterol, less than 1.5gm of sodium, & 5 or more servings of fruits and vegetables daily;Understanding recommendations for meals to include 15-35% energy as protein, 25-35% energy from fat, 35-60% energy from carbohydrates, less than 200mg  of dietary cholesterol, 20-35 gm of total fiber daily;Understanding of distribution of calorie intake throughout the day with the consumption of 4-5 meals/snacks;Weight Gain: Understanding of general recommendations for a high calorie, high protein meal plan that promotes weight gain by distributing calorie intake throughout the day with the consumption for 4-5 meals, snacks, and/or supplements    Improve shortness of breath with ADL's Yes    Intervention Provide education, individualized exercise plan and daily activity instruction to help decrease symptoms of SOB with activities of daily living.    Expected Outcomes Short Term: Improve cardiorespiratory fitness to achieve a reduction of symptoms when performing ADLs;Long Term: Be able to perform more ADLs without symptoms or delay the onset of symptoms    Increase knowledge of respiratory medications and ability to use respiratory devices properly  Yes    Intervention Provide education and demonstration as needed of appropriate use of medications, inhalers, and oxygen therapy.    Expected Outcomes Short Term: Achieves understanding of medications use. Understands that oxygen is a medication prescribed by physician. Demonstrates appropriate use of inhaler and oxygen therapy.;Long Term: Maintain appropriate use of medications, inhalers, and oxygen therapy.          Core Components/Risk Factors/Patient Goals Review:   Goals and Risk Factor Review     Row Name 06/27/24 1418             Core Components/Risk Factors/Patient Goals Review    Personal Goals Review Weight Management/Obesity;Improve shortness of breath with ADL's;Develop more efficient breathing techniques such as purse lipped breathing and diaphragmatic breathing and practicing self-pacing with activity.;Increase knowledge of respiratory medications and ability to use respiratory devices properly.       Review Monthly review of patients Core Components/Risk Factors/Patient Goals are as follows: Goal progressing for weight gain. Stamatia is working with the staff dietitian to achieve her weight gain goals. Goal progressing for improving shortness of breath. Aiyah is currently exercising on RA to keep sats >88%. She is exercising on the N[place] and walking the track. Goal progressing for developing more efficient breathing techniques such as purse lipped breathing and diaphragmatic breathing; and practicing self-pacing with activity. Goal progressing for increase knowledge of respiratory medications and ability to use respiratory devices properly.  We will continue to monitor her progress throughout the program.       Expected Outcomes To improve shortness of breath with ADL's, develop more efficient breathing techniques such as purse lipped breathing and diaphragmatic breathing; and practicing self-pacing with activity, gain weight and increase knowledge of respiratory medications and ability to use respiratory devices properly.          Core Components/Risk Factors/Patient Goals at Discharge (Final Review):   Goals and Risk Factor Review - 06/27/24 1418       Core Components/Risk Factors/Patient Goals Review   Personal Goals Review Weight Management/Obesity;Improve shortness of breath with ADL's;Develop more efficient breathing techniques such as purse lipped breathing and diaphragmatic breathing and practicing self-pacing with activity.;Increase knowledge of respiratory medications and  ability to use respiratory devices properly.    Review Monthly review of patients Core  Components/Risk Factors/Patient Goals are as follows: Goal progressing for weight gain. Areej is working with the staff dietitian to achieve her weight gain goals. Goal progressing for improving shortness of breath. Tariah is currently exercising on RA to keep sats >88%. She is exercising on the Nustep and walking the track. Goal progressing for developing more efficient breathing techniques such as purse lipped breathing and diaphragmatic breathing; and practicing self-pacing with activity. Goal progressing for increase knowledge of respiratory medications and ability to use respiratory devices properly.  We will continue to monitor her progress throughout the program.    Expected Outcomes To improve shortness of breath with ADL's, develop more efficient breathing techniques such as purse lipped breathing and diaphragmatic breathing; and practicing self-pacing with activity, gain weight and increase knowledge of respiratory medications and ability to use respiratory devices properly.          ITP Comments:Pt is making expected progress toward Pulmonary Rehab goals after completing 4 session(s). Recommend continued exercise, life style modification, education, and utilization of breathing techniques to increase stamina and strength, while also decreasing shortness of breath with exertion.  Dr. Slater Staff is Medical Director for Pulmonary Rehab at [facility].          [1]  Current Outpatient Medications:    ALPRAZolam  (XANAX ) 0.25 MG tablet, Take 1 tablet (0.25 mg total) by mouth 2 (two) times daily as needed for anxiety. May take 2 tablets (0.5 mg total) at bedtime., Disp: 60 tablet, Rfl: 0   ALPRAZolam  (XANAX ) 0.5 MG tablet, Take 1 tablet (0.5 mg total) by mouth 3 (three) times daily as needed., Disp: 90 tablet, Rfl: 3   amLODipine  (NORVASC ) 5 MG tablet, TAKE 1 TABLET (5 MG TOTAL) BY MOUTH DAILY., Disp: 90 tablet, Rfl: 3   carvedilol  (COREG ) 12.5 MG tablet, Take 1 tablet (12.5 mg  total) by mouth 2 (two) times daily., Disp: 180 tablet, Rfl: 3   Cholecalciferol  (VITAMIN D3) 250 MCG (10000 UT) capsule, Take 5,000 Units by mouth daily., Disp: , Rfl:    cycloSPORINE  (RESTASIS ) 0.05 % ophthalmic emulsion, Place 1 drop into both eyes 2 (two) times daily., Disp: 180 each, Rfl: 3   dexamethasone  (DECADRON ) 4 MG tablet, Take 1 tablet twice a day the day before, day of, and day after treatment, Disp: 40 tablet, Rfl: 2   dorzolamide -timolol  (COSOPT ) 22.3-6.8 MG/ML ophthalmic solution, Place 1 drop into both eyes 2 (two) times daily., Disp: , Rfl:    enoxaparin  (LOVENOX ) 100 MG/ML injection, Inject 0.95 mLs (95 mg total) into the skin daily., Disp: 30 mL, Rfl: 2   estradiol  (ESTRACE ) 0.1 MG/GM vaginal cream, Place a pea sized amount to upper vulvar are and just inside vagina 2-3 (two to three) times a week., Disp: 42.5 g, Rfl: 1   ezetimibe  (ZETIA ) 10 MG tablet, Take 10 mg by mouth daily., Disp: , Rfl:    famotidine  (PEPCID ) 20 MG tablet, TAKE 1 TABLET BY MOUTH TWICE A DAY, Disp: 180 tablet, Rfl: 1   FeFum-FePoly-FA-B Cmp-C-Biot (FOLIVANE-PLUS) CAPS, Take 1 capsule by mouth in the morning., Disp: 90 capsule, Rfl: 0   fentaNYL  (DURAGESIC ) 12 MCG/HR, Place 1 patch onto the skin every 3 (three) days., Disp: 10 patch, Rfl: 0   FLUoxetine  (PROZAC ) 20 MG capsule, Take 1 capsule (20 mg total) by mouth daily., Disp: 90 capsule, Rfl: 3   folic acid  (FOLVITE ) 1 MG tablet, Take 1  tablet (1 mg total) by mouth daily., Disp: 90 tablet, Rfl: 1   Glycopyrrolate -Formoterol  (BEVESPI  AEROSPHERE) 9-4.8 MCG/ACT AERO, Inhale 2 puffs into the lungs 2 (two) times daily., Disp: 3 each, Rfl: 3   HYDROcodone  bit-homatropine (HYCODAN) 5-1.5 MG/5ML syrup, Take 5 mLs by mouth every 6 (six) hours as needed for cough., Disp: 240 mL, Rfl: 0   hyoscyamine  (LEVSIN  SL) 0.125 MG SL tablet, Place 1-2 tablets (0.125-0.25mg ) under the tongue every 6 (six) hours as needed for cramping., Disp: 30 tablet, Rfl: 3   isosorbide   mononitrate (IMDUR ) 30 MG 24 hr tablet, Take 1 tablet (30 mg total) by mouth daily., Disp: 90 tablet, Rfl: 3   latanoprost  (XALATAN ) 0.005 % ophthalmic solution, Place 1 drop into both eyes at bedtime. , Disp: , Rfl:    lidocaine  (LIDODERM ) 5 %, Place 1 patch onto the skin daily. Remove & Discard patch within 12 hours or as directed by MD, Disp: 9 patch, Rfl: 0   lidocaine -prilocaine  (EMLA ) cream, Apply 1 Application topically as needed., Disp: 30 g, Rfl: 0   nitroGLYCERIN  (NITROSTAT ) 0.4 MG SL tablet, Place 1 tablet (0.4 mg total) under the tongue every 5 (five) minutes as needed for chest pain., Disp: 25 tablet, Rfl: 3   ondansetron  (ZOFRAN -ODT) 4 MG disintegrating tablet, Take 1 tablet (4 mg total) by mouth every 8 (eight) hours as needed for nausea or vomiting., Disp: 30 tablet, Rfl: 1   osimertinib  mesylate (TAGRISSO ) 80 MG tablet, Take 1 tablet (80 mg total) by mouth daily., Disp: 30 tablet, Rfl: 4   oxyCODONE  (OXY IR/ROXICODONE ) 5 MG immediate release tablet, Take 1-2 tablets (5-10 mg total) by mouth every 4 (four) hours as needed for severe pain (pain score 7-10) or breakthrough pain., Disp: 60 tablet, Rfl: 0   Perfluorohexyloctane  (MIEBO ) 1.338 GM/ML SOLN, Place 1 drop into both eyes 4 (four) times daily., Disp: 3 mL, Rfl: 11   Perfluorohexyloctane  (MIEBO ) 1.338 GM/ML SOLN, Place 1 drop into both eyes 4 (four) times daily., Disp: 9 mL, Rfl: 3   predniSONE  (DELTASONE ) 10 MG tablet, TAKE 1 TABLET (10 MG TOTAL) BY MOUTH DAILY WITH BREAKFAST., Disp: 30 tablet, Rfl: 2   prochlorperazine  (COMPAZINE ) 10 MG tablet, Take 1 tablet (10 mg total) by mouth every 6 (six) hours as needed., Disp: 30 tablet, Rfl: 3   REPATHA  SURECLICK 140 MG/ML SOAJ, Inject 140 mg into the skin every 14 (fourteen) days., Disp: 6 mL, Rfl: 3   senna-docusate (STOOL SOFTENER/LAXATIVE) 8.6-50 MG tablet, Take 2 tablets by mouth at bedtime., Disp: 60 tablet, Rfl: 3   Spacer/Aero-Holding Chambers (AEROCHAMBER PLUS) Device, Needed to  be able use inhaler properly for best results, Disp: 1 each, Rfl: 0   triamcinolone  cream (KENALOG ) 0.1 %, Apply a thin layer topically to the affected area 2 (two) times daily., Disp: 30 g, Rfl: 3   Wheat Dextrin (BENEFIBER DRINK MIX) PACK, Take 1 packet by mouth daily., Disp: , Rfl:  [2]  Social History Tobacco Use  Smoking Status Never   Passive exposure: Past  Smokeless Tobacco Never

## 2024-07-10 ENCOUNTER — Other Ambulatory Visit (HOSPITAL_COMMUNITY): Payer: Self-pay

## 2024-07-10 ENCOUNTER — Encounter (HOSPITAL_COMMUNITY)
Admission: RE | Admit: 2024-07-10 | Discharge: 2024-07-10 | Disposition: A | Discharge status: Home / Self Care | Source: Ambulatory Visit | Attending: Pulmonary Disease | Admitting: Pulmonary Disease

## 2024-07-10 DIAGNOSIS — R0609 Other forms of dyspnea: Secondary | ICD-10-CM

## 2024-07-10 NOTE — Progress Notes (Signed)
 Daily Session Note  Patient Details  Name: Allison [name] MRN: [id number] Date of Birth: 1951/04/01 Referring Provider:   Conrad Ports Pulmonary Rehab Walk Test from 06/16/2024 in [facility] for Heart, Vascular, & Lung Health  Referring Provider Hunsucker    Encounter Date: 07/10/2024  Check In:  Session Check In - 07/10/24 1031       Check-In   Supervising physician immediately available to respond to emergencies [facility] MD immediately available    Physician(s) Barnie Press, NP    Location [facility]    Staff Present Ronal Levin, RN, BSN;Casey Claudene, RT;Randi [facility] BS, ACSM-CEP, Exercise Physiologist;Kayelyn Lemon Nicholaus, MS, ACSM-CEP, Exercise Physiologist    Virtual Visit No    Medication changes reported     No    Fall or balance concerns reported    No    Tobacco Cessation No Change    Warm-up and Cool-down Performed as group-led instruction    Resistance Training Performed Yes    VAD Patient? No    PAD/SET Patient? No      Pain Assessment   Currently in Pain? No/denies    Multiple Pain Sites No          Capillary Blood Glucose: No results found. However, due to the size of the patient record, not all encounters were searched. Please check Results Review for a complete set of results.    Tobacco Use History[1]  Goals Met:  Proper associated with RPD/PD & O2 Sat Exercise tolerated well No report of concerns or symptoms today Strength training completed today  Goals Unmet:  Not Applicable  Comments: Service time is from 1022 to 1137.    Dr. Slater Staff is Medical Director for Pulmonary Rehab at [facility].     [1]  Social History Tobacco Use  Smoking Status Never   Passive exposure: Past  Smokeless Tobacco Never

## 2024-07-15 ENCOUNTER — Encounter: Payer: Self-pay | Admitting: Internal Medicine

## 2024-07-15 ENCOUNTER — Encounter: Payer: Self-pay | Admitting: Physician Assistant

## 2024-07-15 ENCOUNTER — Other Ambulatory Visit: Payer: Self-pay | Admitting: Physician Assistant

## 2024-07-15 ENCOUNTER — Other Ambulatory Visit (HOSPITAL_COMMUNITY): Payer: Self-pay

## 2024-07-15 ENCOUNTER — Encounter (HOSPITAL_COMMUNITY): Admission: RE | Admit: 2024-07-15 | Source: Ambulatory Visit

## 2024-07-15 DIAGNOSIS — D509 Iron deficiency anemia, unspecified: Secondary | ICD-10-CM

## 2024-07-15 MED ORDER — FOLIVANE-PLUS PO CAPS
1.0000 | ORAL_CAPSULE | Freq: Every morning | ORAL | 0 refills | Status: AC
  Filled 2024-07-15: qty 90, 90d supply, fill #0

## 2024-07-16 ENCOUNTER — Other Ambulatory Visit (HOSPITAL_COMMUNITY): Payer: Self-pay

## 2024-07-17 ENCOUNTER — Other Ambulatory Visit (HOSPITAL_COMMUNITY): Payer: Self-pay

## 2024-07-17 ENCOUNTER — Telehealth (HOSPITAL_COMMUNITY): Payer: Self-pay | Admitting: *Deleted

## 2024-07-17 ENCOUNTER — Encounter (HOSPITAL_COMMUNITY)
Admission: RE | Admit: 2024-07-17 | Discharge: 2024-07-17 | Disposition: A | Discharge status: Home / Self Care | Source: Ambulatory Visit | Attending: Pulmonary Disease | Admitting: Pulmonary Disease

## 2024-07-17 DIAGNOSIS — R0609 Other forms of dyspnea: Secondary | ICD-10-CM | POA: Diagnosis not present

## 2024-07-17 NOTE — Telephone Encounter (Signed)
 Called pt to notify her that Pulmonary Rehab elevator is out of service. She agrees that she can manage one flight of stairs.  Aliene Aris BS, ACSM-CEP 07/17/2024 8:30 AM

## 2024-07-17 NOTE — Progress Notes (Signed)
 Daily Session Note  Patient Details  Name: Allison [name] MRN: [id number] Date of Birth: Jun 16, 1951 Referring Provider:   Conrad Ports Pulmonary Rehab Walk Test from 06/16/2024 in [facility] for Heart, Vascular, & Lung Health  Referring Provider Hunsucker    Encounter Date: 07/17/2024  Check In:  Session Check In - 07/17/24 1023       Check-In   Supervising physician immediately available to respond to emergencies [facility] MD immediately available    Physician(s) Josefa Beauvais, NP    Location [facility]    Staff Present Ronal Levin, RN, BSN;Casey Claudene, RT;Malayjah Otoole [facility] BS, ACSM-CEP, Exercise Physiologist;Kaylee Nicholaus, MS, ACSM-CEP, Exercise Physiologist    Virtual Visit No    Medication changes reported     No    Fall or balance concerns reported    No    Tobacco Cessation No Change    Warm-up and Cool-down Performed as group-led instruction    Resistance Training Performed Yes    VAD Patient? No    PAD/SET Patient? No      Pain Assessment   Currently in Pain? No/denies    Pain Score 0-No pain    Multiple Pain Sites No          Capillary Blood Glucose: No results found. However, due to the size of the patient record, not all encounters were searched. Please check Results Review for a complete set of results.    Tobacco Use History[1]  Goals Met:  Proper associated with RPD/PD & O2 Sat Independence with exercise equipment Exercise tolerated well No report of concerns or symptoms today Strength training completed today  Goals Unmet:  Not Applicable  Comments: Service time is from 1007 to 1141.    Dr. Slater Staff is Medical Director for Pulmonary Rehab at [facility].     [1]  Social History Tobacco Use  Smoking Status Never   Passive exposure: Past  Smokeless Tobacco Never

## 2024-07-21 ENCOUNTER — Inpatient Hospital Stay: Admitting: Physician Assistant

## 2024-07-21 ENCOUNTER — Inpatient Hospital Stay

## 2024-07-21 ENCOUNTER — Other Ambulatory Visit (HOSPITAL_COMMUNITY): Payer: Self-pay

## 2024-07-21 ENCOUNTER — Inpatient Hospital Stay: Attending: Internal Medicine | Admitting: Physician Assistant

## 2024-07-21 DIAGNOSIS — C3491 Malignant neoplasm of unspecified part of right bronchus or lung: Secondary | ICD-10-CM

## 2024-07-21 MED ORDER — DEXAMETHASONE 4 MG PO TABS: 4.0000 mg | ORAL_TABLET | Freq: Two times a day (BID) | ORAL | 2 refills | Status: AC

## 2024-07-21 MED ORDER — PROCHLORPERAZINE MALEATE 10 MG PO TABS
10.0000 mg | ORAL_TABLET | Freq: Four times a day (QID) | ORAL | 3 refills | Status: AC | PRN
  Filled 2024-07-21: qty 30, 8d supply, fill #0

## 2024-07-22 ENCOUNTER — Other Ambulatory Visit: Payer: Self-pay

## 2024-07-22 ENCOUNTER — Encounter (HOSPITAL_COMMUNITY)
Admission: RE | Admit: 2024-07-22 | Discharge: 2024-07-22 | Disposition: A | Source: Ambulatory Visit | Attending: Pulmonary Disease | Admitting: Pulmonary Disease

## 2024-07-22 VITALS — Wt 141.1 lb

## 2024-07-22 DIAGNOSIS — C3491 Malignant neoplasm of unspecified part of right bronchus or lung: Secondary | ICD-10-CM

## 2024-07-22 DIAGNOSIS — R0609 Other forms of dyspnea: Secondary | ICD-10-CM

## 2024-07-22 MED ORDER — OSIMERTINIB MESYLATE 80 MG PO TABS: 80.0000 mg | ORAL_TABLET | Freq: Every day | ORAL | 4 refills | Status: AC

## 2024-07-22 NOTE — Progress Notes (Signed)
 Daily Session Note  Patient Details  Name: Allison [name] MRN: [id number] Date of Birth: 1950-09-21 Referring Provider:   Conrad Ports Pulmonary Rehab Walk Test from 06/16/2024 in [facility] for Heart, Vascular, & Lung Health  Referring Provider Hunsucker    Encounter Date: 07/22/2024  Check In:  Session Check In - 07/22/24 1014       Check-In   Supervising physician immediately available to respond to emergencies [facility] MD immediately available    Physician(s) Josefa Beauvais, NP    Location [facility]    Staff Present Ronal Levin, RN, BSN;Casey Claudene, RT;Randi [facility] BS, ACSM-CEP, Exercise Physiologist;Drakkar Medeiros Nicholaus, MS, ACSM-CEP, Exercise Physiologist    Virtual Visit No    Medication changes reported     No    Fall or balance concerns reported    No    Tobacco Cessation No Change    Warm-up and Cool-down Performed as group-led instruction    Resistance Training Performed Yes    VAD Patient? No    PAD/SET Patient? No      Pain Assessment   Currently in Pain? No/denies    Multiple Pain Sites No          Capillary Blood Glucose: No results found. However, due to the size of the patient record, not all encounters were searched. Please check Results Review for a complete set of results.   Exercise Prescription Changes - 07/22/24 1100       Response to Exercise   Blood Pressure (Admit) 102/60    Blood Pressure (Exercise) 120/60    Blood Pressure (Exit) 110/52    Heart Rate (Admit) 77 bpm    Heart Rate (Exercise) 90 bpm    Heart Rate (Exit) 85 bpm    Oxygen Saturation (Admit) 99 %    Oxygen Saturation (Exercise) 100 %    Oxygen Saturation (Exit) 100 %    Rating of Perceived Exertion (Exercise) 11    Perceived Dyspnea (Exercise) 1    Duration Continue with 30 min of aerobic exercise without signs/symptoms of physical distress.    Intensity THRR unchanged      Progression   Progression Continue to progress workloads to  maintain intensity without signs/symptoms of physical distress.      Resistance Training   Weight red bands    Reps 10-15    Time 10 Minutes      NuStep   Level 2    Minutes 15    METs 1.9      Track   Laps 8    Minutes 15    METs 2.23          Tobacco Use History[1]  Goals Met:  Proper associated with RPD/PD & O2 Sat Independence with exercise equipment Exercise tolerated well No report of concerns or symptoms today Strength training completed today  Goals Unmet:  Not Applicable  Comments: Service time is from 1007 to 1125.    Dr. Slater Staff is Medical Director for Pulmonary Rehab at [facility].     [1]  Social History Tobacco Use  Smoking Status Never   Passive exposure: Past  Smokeless Tobacco Never

## 2024-07-24 ENCOUNTER — Encounter (HOSPITAL_COMMUNITY)
Admission: RE | Admit: 2024-07-24 | Discharge: 2024-07-24 | Disposition: A | Source: Ambulatory Visit | Attending: Pulmonary Disease | Admitting: Pulmonary Disease

## 2024-07-24 DIAGNOSIS — R0609 Other forms of dyspnea: Secondary | ICD-10-CM

## 2024-07-24 NOTE — Progress Notes (Signed)
 Daily Session Note  Patient Details  Name: Allison [name] MRN: [id number] Date of Birth: 14-Feb-1951 Referring Provider:   Conrad Ports Pulmonary Rehab Walk Test from 06/16/2024 in [facility] for Heart, Vascular, & Lung Health  Referring Provider Hunsucker    Encounter Date: 07/24/2024  Check In:  Session Check In - 07/24/24 1030       Check-In   Supervising physician immediately available to respond to emergencies [facility] MD immediately available    Physician(s) Orren Fabry, PA    Location [facility]    Staff Present Ronal Levin, RN, BSN;Casey Claudene, RT;Randi Midge BS, ACSM-CEP, Exercise Physiologist;Genowefa Morga Nicholaus, MS, ACSM-CEP, Exercise Physiologist    Virtual Visit No    Medication changes reported     No    Fall or balance concerns reported    No    Tobacco Cessation No Change    Warm-up and Cool-down Performed as group-led instruction    Resistance Training Performed Yes    VAD Patient? No    PAD/SET Patient? No      Pain Assessment   Currently in Pain? No/denies          Capillary Blood Glucose: No results found. However, due to the size of the patient record, not all encounters were searched. Please check Results Review for a complete set of results.    Tobacco Use History[1]  Goals Met:  Proper associated with RPD/PD & O2 Sat Exercise tolerated well No report of concerns or symptoms today Strength training completed today  Goals Unmet:  Not Applicable  Comments: Service time is from 1016 to 1142.    Dr. Slater Staff is Medical Director for Pulmonary Rehab at [facility].     [1]  Social History Tobacco Use  Smoking Status Never   Passive exposure: Past  Smokeless Tobacco Never

## 2024-07-25 ENCOUNTER — Telehealth: Payer: Self-pay

## 2024-07-25 NOTE — Telephone Encounter (Signed)
 Patient confirmed the appointment for tomorrow, 2/7, at 1045 for a port flush with labs, followed by an injection.

## 2024-07-26 ENCOUNTER — Inpatient Hospital Stay

## 2024-07-29 ENCOUNTER — Encounter (HOSPITAL_COMMUNITY)

## 2024-07-31 ENCOUNTER — Encounter (HOSPITAL_COMMUNITY)

## 2024-08-05 ENCOUNTER — Encounter (HOSPITAL_COMMUNITY)

## 2024-08-07 ENCOUNTER — Encounter (HOSPITAL_COMMUNITY)

## 2024-08-11 ENCOUNTER — Inpatient Hospital Stay

## 2024-08-11 ENCOUNTER — Inpatient Hospital Stay: Admitting: Internal Medicine

## 2024-08-11 ENCOUNTER — Inpatient Hospital Stay: Admitting: Physician Assistant

## 2024-08-12 ENCOUNTER — Encounter (HOSPITAL_COMMUNITY)

## 2024-08-13 ENCOUNTER — Ambulatory Visit (HOSPITAL_COMMUNITY)

## 2024-08-14 ENCOUNTER — Encounter (HOSPITAL_COMMUNITY)

## 2024-08-18 ENCOUNTER — Inpatient Hospital Stay: Admitting: Physician Assistant

## 2024-08-18 ENCOUNTER — Inpatient Hospital Stay: Attending: Internal Medicine

## 2024-08-18 ENCOUNTER — Inpatient Hospital Stay

## 2024-08-19 ENCOUNTER — Encounter (HOSPITAL_COMMUNITY)

## 2024-08-21 ENCOUNTER — Encounter (HOSPITAL_COMMUNITY)

## 2024-08-26 ENCOUNTER — Encounter (HOSPITAL_COMMUNITY)

## 2024-08-28 ENCOUNTER — Encounter (HOSPITAL_COMMUNITY)

## 2024-09-02 ENCOUNTER — Encounter (HOSPITAL_COMMUNITY)

## 2024-09-04 ENCOUNTER — Encounter (HOSPITAL_COMMUNITY)

## 2024-09-08 ENCOUNTER — Inpatient Hospital Stay

## 2024-09-08 ENCOUNTER — Inpatient Hospital Stay: Admitting: Internal Medicine

## 2024-09-09 ENCOUNTER — Encounter (HOSPITAL_COMMUNITY)

## 2024-09-11 ENCOUNTER — Encounter (HOSPITAL_COMMUNITY)
